# Patient Record
Sex: Female | Born: 1950 | Race: White | Hispanic: No | Marital: Married | Smoking: Former smoker
Health system: Southern US, Community
[De-identification: ages and names within clinical notes are randomized; demographics above are authoritative.]

## PROBLEM LIST (undated history)

## (undated) DIAGNOSIS — J449 Chronic obstructive pulmonary disease, unspecified: Secondary | ICD-10-CM

## (undated) DIAGNOSIS — J42 Unspecified chronic bronchitis: Secondary | ICD-10-CM

## (undated) DIAGNOSIS — M109 Gout, unspecified: Secondary | ICD-10-CM

## (undated) DIAGNOSIS — F329 Major depressive disorder, single episode, unspecified: Secondary | ICD-10-CM

## (undated) DIAGNOSIS — K635 Polyp of colon: Secondary | ICD-10-CM

## (undated) DIAGNOSIS — R0902 Hypoxemia: Secondary | ICD-10-CM

## (undated) DIAGNOSIS — F32A Depression, unspecified: Secondary | ICD-10-CM

## (undated) DIAGNOSIS — K219 Gastro-esophageal reflux disease without esophagitis: Secondary | ICD-10-CM

## (undated) DIAGNOSIS — M797 Fibromyalgia: Secondary | ICD-10-CM

## (undated) DIAGNOSIS — F419 Anxiety disorder, unspecified: Secondary | ICD-10-CM

## (undated) DIAGNOSIS — G629 Polyneuropathy, unspecified: Secondary | ICD-10-CM

## (undated) DIAGNOSIS — J189 Pneumonia, unspecified organism: Secondary | ICD-10-CM

## (undated) DIAGNOSIS — M1990 Unspecified osteoarthritis, unspecified site: Secondary | ICD-10-CM

## (undated) DIAGNOSIS — T7840XA Allergy, unspecified, initial encounter: Secondary | ICD-10-CM

## (undated) DIAGNOSIS — J45909 Unspecified asthma, uncomplicated: Secondary | ICD-10-CM

## (undated) DIAGNOSIS — H547 Unspecified visual loss: Secondary | ICD-10-CM

## (undated) DIAGNOSIS — F319 Bipolar disorder, unspecified: Secondary | ICD-10-CM

## (undated) DIAGNOSIS — Z8719 Personal history of other diseases of the digestive system: Secondary | ICD-10-CM

## (undated) DIAGNOSIS — K8020 Calculus of gallbladder without cholecystitis without obstruction: Secondary | ICD-10-CM

## (undated) DIAGNOSIS — E119 Type 2 diabetes mellitus without complications: Secondary | ICD-10-CM

## (undated) DIAGNOSIS — Z8489 Family history of other specified conditions: Secondary | ICD-10-CM

## (undated) DIAGNOSIS — H9190 Unspecified hearing loss, unspecified ear: Secondary | ICD-10-CM

## (undated) DIAGNOSIS — I4891 Unspecified atrial fibrillation: Secondary | ICD-10-CM

## (undated) DIAGNOSIS — Z86010 Personal history of colonic polyps: Secondary | ICD-10-CM

## (undated) DIAGNOSIS — I428 Other cardiomyopathies: Secondary | ICD-10-CM

## (undated) DIAGNOSIS — I5042 Chronic combined systolic (congestive) and diastolic (congestive) heart failure: Secondary | ICD-10-CM

## (undated) DIAGNOSIS — Z9289 Personal history of other medical treatment: Secondary | ICD-10-CM

## (undated) DIAGNOSIS — E7800 Pure hypercholesterolemia, unspecified: Secondary | ICD-10-CM

## (undated) DIAGNOSIS — G4733 Obstructive sleep apnea (adult) (pediatric): Secondary | ICD-10-CM

## (undated) DIAGNOSIS — Z9981 Dependence on supplemental oxygen: Secondary | ICD-10-CM

## (undated) DIAGNOSIS — D649 Anemia, unspecified: Secondary | ICD-10-CM

## (undated) HISTORY — PX: UMBILICAL HERNIA REPAIR: SHX196

## (undated) HISTORY — DX: Calculus of gallbladder without cholecystitis without obstruction: K80.20

## (undated) HISTORY — DX: Major depressive disorder, single episode, unspecified: F32.9

## (undated) HISTORY — DX: Depression, unspecified: F32.A

## (undated) HISTORY — DX: Personal history of colonic polyps: Z86.010

## (undated) HISTORY — DX: Allergy, unspecified, initial encounter: T78.40XA

## (undated) HISTORY — PX: HERNIA REPAIR: SHX51

## (undated) HISTORY — DX: Pneumonia, unspecified organism: J18.9

## (undated) HISTORY — DX: Anxiety disorder, unspecified: F41.9

## (undated) HISTORY — PX: BLADDER SUSPENSION: SHX72

## (undated) HISTORY — DX: Hypoxemia: R09.02

## (undated) HISTORY — DX: Gout, unspecified: M10.9

## (undated) HISTORY — DX: Chronic combined systolic (congestive) and diastolic (congestive) heart failure: I50.42

## (undated) HISTORY — DX: Polyp of colon: K63.5

## (undated) HISTORY — PX: HEEL SPUR SURGERY: SHX665

## (undated) HISTORY — PX: APPENDECTOMY: SHX54

## (undated) HISTORY — PX: TUBAL LIGATION: SHX77

## (undated) HISTORY — PX: OTHER SURGICAL HISTORY: SHX169

## (undated) HISTORY — DX: Polyneuropathy, unspecified: G62.9

## (undated) HISTORY — DX: Other cardiomyopathies: I42.8

---

## 2007-06-04 HISTORY — PX: CERVICAL DISC SURGERY: SHX588

## 2011-06-07 DIAGNOSIS — N6029 Fibroadenosis of unspecified breast: Secondary | ICD-10-CM | POA: Diagnosis not present

## 2011-06-07 DIAGNOSIS — Z1231 Encounter for screening mammogram for malignant neoplasm of breast: Secondary | ICD-10-CM | POA: Diagnosis not present

## 2011-06-17 DIAGNOSIS — Z7901 Long term (current) use of anticoagulants: Secondary | ICD-10-CM | POA: Diagnosis not present

## 2011-06-17 DIAGNOSIS — I4891 Unspecified atrial fibrillation: Secondary | ICD-10-CM | POA: Diagnosis not present

## 2011-06-17 DIAGNOSIS — Z5181 Encounter for therapeutic drug level monitoring: Secondary | ICD-10-CM | POA: Diagnosis not present

## 2011-06-24 DIAGNOSIS — E1129 Type 2 diabetes mellitus with other diabetic kidney complication: Secondary | ICD-10-CM | POA: Diagnosis not present

## 2011-06-27 DIAGNOSIS — M5412 Radiculopathy, cervical region: Secondary | ICD-10-CM | POA: Diagnosis not present

## 2011-06-27 DIAGNOSIS — M961 Postlaminectomy syndrome, not elsewhere classified: Secondary | ICD-10-CM | POA: Diagnosis not present

## 2011-06-27 DIAGNOSIS — IMO0002 Reserved for concepts with insufficient information to code with codable children: Secondary | ICD-10-CM | POA: Diagnosis not present

## 2011-06-27 DIAGNOSIS — S142XXA Injury of nerve root of cervical spine, initial encounter: Secondary | ICD-10-CM | POA: Diagnosis not present

## 2011-06-27 DIAGNOSIS — M5137 Other intervertebral disc degeneration, lumbosacral region: Secondary | ICD-10-CM | POA: Diagnosis not present

## 2011-06-27 DIAGNOSIS — M1610 Unilateral primary osteoarthritis, unspecified hip: Secondary | ICD-10-CM | POA: Diagnosis not present

## 2011-06-27 DIAGNOSIS — IMO0001 Reserved for inherently not codable concepts without codable children: Secondary | ICD-10-CM | POA: Diagnosis not present

## 2011-06-27 DIAGNOSIS — M47817 Spondylosis without myelopathy or radiculopathy, lumbosacral region: Secondary | ICD-10-CM | POA: Diagnosis not present

## 2011-07-03 DIAGNOSIS — J449 Chronic obstructive pulmonary disease, unspecified: Secondary | ICD-10-CM | POA: Diagnosis not present

## 2011-07-03 DIAGNOSIS — G4710 Hypersomnia, unspecified: Secondary | ICD-10-CM | POA: Diagnosis not present

## 2011-07-03 DIAGNOSIS — G4730 Sleep apnea, unspecified: Secondary | ICD-10-CM | POA: Diagnosis not present

## 2011-07-03 DIAGNOSIS — G4733 Obstructive sleep apnea (adult) (pediatric): Secondary | ICD-10-CM | POA: Diagnosis not present

## 2011-07-04 DIAGNOSIS — I951 Orthostatic hypotension: Secondary | ICD-10-CM | POA: Diagnosis not present

## 2011-07-04 DIAGNOSIS — J449 Chronic obstructive pulmonary disease, unspecified: Secondary | ICD-10-CM | POA: Diagnosis not present

## 2011-07-04 DIAGNOSIS — R0902 Hypoxemia: Secondary | ICD-10-CM | POA: Diagnosis not present

## 2011-07-05 DIAGNOSIS — Z6841 Body Mass Index (BMI) 40.0 and over, adult: Secondary | ICD-10-CM | POA: Diagnosis not present

## 2011-07-05 DIAGNOSIS — E785 Hyperlipidemia, unspecified: Secondary | ICD-10-CM | POA: Diagnosis not present

## 2011-07-05 DIAGNOSIS — E119 Type 2 diabetes mellitus without complications: Secondary | ICD-10-CM | POA: Diagnosis not present

## 2011-07-12 DIAGNOSIS — M961 Postlaminectomy syndrome, not elsewhere classified: Secondary | ICD-10-CM | POA: Diagnosis not present

## 2011-07-12 DIAGNOSIS — M5137 Other intervertebral disc degeneration, lumbosacral region: Secondary | ICD-10-CM | POA: Diagnosis not present

## 2011-07-12 DIAGNOSIS — M5412 Radiculopathy, cervical region: Secondary | ICD-10-CM | POA: Diagnosis not present

## 2011-07-15 DIAGNOSIS — M5412 Radiculopathy, cervical region: Secondary | ICD-10-CM | POA: Diagnosis not present

## 2011-07-15 DIAGNOSIS — I4891 Unspecified atrial fibrillation: Secondary | ICD-10-CM | POA: Diagnosis not present

## 2011-07-15 DIAGNOSIS — Z7901 Long term (current) use of anticoagulants: Secondary | ICD-10-CM | POA: Diagnosis not present

## 2011-07-15 DIAGNOSIS — M5137 Other intervertebral disc degeneration, lumbosacral region: Secondary | ICD-10-CM | POA: Diagnosis not present

## 2011-07-15 DIAGNOSIS — M961 Postlaminectomy syndrome, not elsewhere classified: Secondary | ICD-10-CM | POA: Diagnosis not present

## 2011-07-15 DIAGNOSIS — Z5181 Encounter for therapeutic drug level monitoring: Secondary | ICD-10-CM | POA: Diagnosis not present

## 2011-07-17 DIAGNOSIS — M5412 Radiculopathy, cervical region: Secondary | ICD-10-CM | POA: Diagnosis not present

## 2011-07-17 DIAGNOSIS — M961 Postlaminectomy syndrome, not elsewhere classified: Secondary | ICD-10-CM | POA: Diagnosis not present

## 2011-07-17 DIAGNOSIS — M5137 Other intervertebral disc degeneration, lumbosacral region: Secondary | ICD-10-CM | POA: Diagnosis not present

## 2011-07-19 DIAGNOSIS — M5137 Other intervertebral disc degeneration, lumbosacral region: Secondary | ICD-10-CM | POA: Diagnosis not present

## 2011-07-19 DIAGNOSIS — M5412 Radiculopathy, cervical region: Secondary | ICD-10-CM | POA: Diagnosis not present

## 2011-07-19 DIAGNOSIS — M961 Postlaminectomy syndrome, not elsewhere classified: Secondary | ICD-10-CM | POA: Diagnosis not present

## 2011-07-22 DIAGNOSIS — E1149 Type 2 diabetes mellitus with other diabetic neurological complication: Secondary | ICD-10-CM | POA: Diagnosis not present

## 2011-07-22 DIAGNOSIS — M79609 Pain in unspecified limb: Secondary | ICD-10-CM | POA: Diagnosis not present

## 2011-07-22 DIAGNOSIS — B351 Tinea unguium: Secondary | ICD-10-CM | POA: Diagnosis not present

## 2011-07-22 DIAGNOSIS — L84 Corns and callosities: Secondary | ICD-10-CM | POA: Diagnosis not present

## 2011-07-24 DIAGNOSIS — E785 Hyperlipidemia, unspecified: Secondary | ICD-10-CM | POA: Diagnosis not present

## 2011-07-24 DIAGNOSIS — M961 Postlaminectomy syndrome, not elsewhere classified: Secondary | ICD-10-CM | POA: Diagnosis not present

## 2011-07-24 DIAGNOSIS — M5137 Other intervertebral disc degeneration, lumbosacral region: Secondary | ICD-10-CM | POA: Diagnosis not present

## 2011-07-24 DIAGNOSIS — M5412 Radiculopathy, cervical region: Secondary | ICD-10-CM | POA: Diagnosis not present

## 2011-07-24 DIAGNOSIS — J069 Acute upper respiratory infection, unspecified: Secondary | ICD-10-CM | POA: Diagnosis not present

## 2011-07-25 DIAGNOSIS — M5137 Other intervertebral disc degeneration, lumbosacral region: Secondary | ICD-10-CM | POA: Diagnosis not present

## 2011-07-25 DIAGNOSIS — IMO0002 Reserved for concepts with insufficient information to code with codable children: Secondary | ICD-10-CM | POA: Diagnosis not present

## 2011-07-25 DIAGNOSIS — M1610 Unilateral primary osteoarthritis, unspecified hip: Secondary | ICD-10-CM | POA: Diagnosis not present

## 2011-07-25 DIAGNOSIS — M5412 Radiculopathy, cervical region: Secondary | ICD-10-CM | POA: Diagnosis not present

## 2011-07-25 DIAGNOSIS — M961 Postlaminectomy syndrome, not elsewhere classified: Secondary | ICD-10-CM | POA: Diagnosis not present

## 2011-07-25 DIAGNOSIS — S142XXA Injury of nerve root of cervical spine, initial encounter: Secondary | ICD-10-CM | POA: Diagnosis not present

## 2011-07-25 DIAGNOSIS — M47817 Spondylosis without myelopathy or radiculopathy, lumbosacral region: Secondary | ICD-10-CM | POA: Diagnosis not present

## 2011-07-25 DIAGNOSIS — IMO0001 Reserved for inherently not codable concepts without codable children: Secondary | ICD-10-CM | POA: Diagnosis not present

## 2011-07-26 DIAGNOSIS — M5412 Radiculopathy, cervical region: Secondary | ICD-10-CM | POA: Diagnosis not present

## 2011-07-26 DIAGNOSIS — M961 Postlaminectomy syndrome, not elsewhere classified: Secondary | ICD-10-CM | POA: Diagnosis not present

## 2011-07-26 DIAGNOSIS — M5137 Other intervertebral disc degeneration, lumbosacral region: Secondary | ICD-10-CM | POA: Diagnosis not present

## 2011-07-29 DIAGNOSIS — M961 Postlaminectomy syndrome, not elsewhere classified: Secondary | ICD-10-CM | POA: Diagnosis not present

## 2011-07-29 DIAGNOSIS — M5137 Other intervertebral disc degeneration, lumbosacral region: Secondary | ICD-10-CM | POA: Diagnosis not present

## 2011-07-29 DIAGNOSIS — M5412 Radiculopathy, cervical region: Secondary | ICD-10-CM | POA: Diagnosis not present

## 2011-07-30 DIAGNOSIS — J069 Acute upper respiratory infection, unspecified: Secondary | ICD-10-CM | POA: Diagnosis not present

## 2011-07-30 DIAGNOSIS — J209 Acute bronchitis, unspecified: Secondary | ICD-10-CM | POA: Diagnosis not present

## 2011-07-31 DIAGNOSIS — M5137 Other intervertebral disc degeneration, lumbosacral region: Secondary | ICD-10-CM | POA: Diagnosis not present

## 2011-07-31 DIAGNOSIS — M961 Postlaminectomy syndrome, not elsewhere classified: Secondary | ICD-10-CM | POA: Diagnosis not present

## 2011-07-31 DIAGNOSIS — M5412 Radiculopathy, cervical region: Secondary | ICD-10-CM | POA: Diagnosis not present

## 2011-08-02 DIAGNOSIS — M5412 Radiculopathy, cervical region: Secondary | ICD-10-CM | POA: Diagnosis not present

## 2011-08-02 DIAGNOSIS — M5137 Other intervertebral disc degeneration, lumbosacral region: Secondary | ICD-10-CM | POA: Diagnosis not present

## 2011-08-02 DIAGNOSIS — M961 Postlaminectomy syndrome, not elsewhere classified: Secondary | ICD-10-CM | POA: Diagnosis not present

## 2011-08-05 DIAGNOSIS — M5137 Other intervertebral disc degeneration, lumbosacral region: Secondary | ICD-10-CM | POA: Diagnosis not present

## 2011-08-05 DIAGNOSIS — M961 Postlaminectomy syndrome, not elsewhere classified: Secondary | ICD-10-CM | POA: Diagnosis not present

## 2011-08-05 DIAGNOSIS — M5412 Radiculopathy, cervical region: Secondary | ICD-10-CM | POA: Diagnosis not present

## 2011-08-07 DIAGNOSIS — M5412 Radiculopathy, cervical region: Secondary | ICD-10-CM | POA: Diagnosis not present

## 2011-08-07 DIAGNOSIS — M961 Postlaminectomy syndrome, not elsewhere classified: Secondary | ICD-10-CM | POA: Diagnosis not present

## 2011-08-07 DIAGNOSIS — M5137 Other intervertebral disc degeneration, lumbosacral region: Secondary | ICD-10-CM | POA: Diagnosis not present

## 2011-08-09 DIAGNOSIS — M5412 Radiculopathy, cervical region: Secondary | ICD-10-CM | POA: Diagnosis not present

## 2011-08-09 DIAGNOSIS — M5137 Other intervertebral disc degeneration, lumbosacral region: Secondary | ICD-10-CM | POA: Diagnosis not present

## 2011-08-09 DIAGNOSIS — M961 Postlaminectomy syndrome, not elsewhere classified: Secondary | ICD-10-CM | POA: Diagnosis not present

## 2011-08-12 DIAGNOSIS — M961 Postlaminectomy syndrome, not elsewhere classified: Secondary | ICD-10-CM | POA: Diagnosis not present

## 2011-08-12 DIAGNOSIS — M5137 Other intervertebral disc degeneration, lumbosacral region: Secondary | ICD-10-CM | POA: Diagnosis not present

## 2011-08-12 DIAGNOSIS — M5412 Radiculopathy, cervical region: Secondary | ICD-10-CM | POA: Diagnosis not present

## 2011-08-14 DIAGNOSIS — J449 Chronic obstructive pulmonary disease, unspecified: Secondary | ICD-10-CM | POA: Diagnosis not present

## 2011-08-14 DIAGNOSIS — M549 Dorsalgia, unspecified: Secondary | ICD-10-CM | POA: Diagnosis not present

## 2011-08-14 DIAGNOSIS — E119 Type 2 diabetes mellitus without complications: Secondary | ICD-10-CM | POA: Diagnosis not present

## 2011-08-14 DIAGNOSIS — Z818 Family history of other mental and behavioral disorders: Secondary | ICD-10-CM | POA: Diagnosis not present

## 2011-08-14 DIAGNOSIS — M5412 Radiculopathy, cervical region: Secondary | ICD-10-CM | POA: Diagnosis not present

## 2011-08-14 DIAGNOSIS — M5137 Other intervertebral disc degeneration, lumbosacral region: Secondary | ICD-10-CM | POA: Diagnosis not present

## 2011-08-14 DIAGNOSIS — F39 Unspecified mood [affective] disorder: Secondary | ICD-10-CM | POA: Diagnosis not present

## 2011-08-14 DIAGNOSIS — M961 Postlaminectomy syndrome, not elsewhere classified: Secondary | ICD-10-CM | POA: Diagnosis not present

## 2011-08-16 DIAGNOSIS — M5412 Radiculopathy, cervical region: Secondary | ICD-10-CM | POA: Diagnosis not present

## 2011-08-16 DIAGNOSIS — M5137 Other intervertebral disc degeneration, lumbosacral region: Secondary | ICD-10-CM | POA: Diagnosis not present

## 2011-08-16 DIAGNOSIS — M961 Postlaminectomy syndrome, not elsewhere classified: Secondary | ICD-10-CM | POA: Diagnosis not present

## 2011-08-19 DIAGNOSIS — I4891 Unspecified atrial fibrillation: Secondary | ICD-10-CM | POA: Diagnosis not present

## 2011-08-19 DIAGNOSIS — M5412 Radiculopathy, cervical region: Secondary | ICD-10-CM | POA: Diagnosis not present

## 2011-08-19 DIAGNOSIS — M961 Postlaminectomy syndrome, not elsewhere classified: Secondary | ICD-10-CM | POA: Diagnosis not present

## 2011-08-19 DIAGNOSIS — M5137 Other intervertebral disc degeneration, lumbosacral region: Secondary | ICD-10-CM | POA: Diagnosis not present

## 2011-08-19 DIAGNOSIS — Z7901 Long term (current) use of anticoagulants: Secondary | ICD-10-CM | POA: Diagnosis not present

## 2011-08-19 DIAGNOSIS — Z5181 Encounter for therapeutic drug level monitoring: Secondary | ICD-10-CM | POA: Diagnosis not present

## 2011-08-21 DIAGNOSIS — M47817 Spondylosis without myelopathy or radiculopathy, lumbosacral region: Secondary | ICD-10-CM | POA: Diagnosis not present

## 2011-08-21 DIAGNOSIS — M5137 Other intervertebral disc degeneration, lumbosacral region: Secondary | ICD-10-CM | POA: Diagnosis not present

## 2011-08-21 DIAGNOSIS — M5412 Radiculopathy, cervical region: Secondary | ICD-10-CM | POA: Diagnosis not present

## 2011-08-21 DIAGNOSIS — M1610 Unilateral primary osteoarthritis, unspecified hip: Secondary | ICD-10-CM | POA: Diagnosis not present

## 2011-08-21 DIAGNOSIS — M961 Postlaminectomy syndrome, not elsewhere classified: Secondary | ICD-10-CM | POA: Diagnosis not present

## 2011-08-21 DIAGNOSIS — M1710 Unilateral primary osteoarthritis, unspecified knee: Secondary | ICD-10-CM | POA: Diagnosis not present

## 2011-08-21 DIAGNOSIS — M25569 Pain in unspecified knee: Secondary | ICD-10-CM | POA: Diagnosis not present

## 2011-08-21 DIAGNOSIS — IMO0002 Reserved for concepts with insufficient information to code with codable children: Secondary | ICD-10-CM | POA: Diagnosis not present

## 2011-08-22 DIAGNOSIS — IMO0002 Reserved for concepts with insufficient information to code with codable children: Secondary | ICD-10-CM | POA: Diagnosis not present

## 2011-08-22 DIAGNOSIS — M1710 Unilateral primary osteoarthritis, unspecified knee: Secondary | ICD-10-CM | POA: Diagnosis not present

## 2011-08-22 DIAGNOSIS — R109 Unspecified abdominal pain: Secondary | ICD-10-CM | POA: Diagnosis not present

## 2011-08-22 DIAGNOSIS — E785 Hyperlipidemia, unspecified: Secondary | ICD-10-CM | POA: Diagnosis not present

## 2011-08-22 DIAGNOSIS — M1990 Unspecified osteoarthritis, unspecified site: Secondary | ICD-10-CM | POA: Diagnosis not present

## 2011-08-23 DIAGNOSIS — M961 Postlaminectomy syndrome, not elsewhere classified: Secondary | ICD-10-CM | POA: Diagnosis not present

## 2011-08-23 DIAGNOSIS — M5412 Radiculopathy, cervical region: Secondary | ICD-10-CM | POA: Diagnosis not present

## 2011-08-23 DIAGNOSIS — M5137 Other intervertebral disc degeneration, lumbosacral region: Secondary | ICD-10-CM | POA: Diagnosis not present

## 2011-08-24 DIAGNOSIS — M5137 Other intervertebral disc degeneration, lumbosacral region: Secondary | ICD-10-CM | POA: Diagnosis not present

## 2011-08-24 DIAGNOSIS — M961 Postlaminectomy syndrome, not elsewhere classified: Secondary | ICD-10-CM | POA: Diagnosis not present

## 2011-08-24 DIAGNOSIS — M5412 Radiculopathy, cervical region: Secondary | ICD-10-CM | POA: Diagnosis not present

## 2011-08-26 DIAGNOSIS — M5412 Radiculopathy, cervical region: Secondary | ICD-10-CM | POA: Diagnosis not present

## 2011-08-26 DIAGNOSIS — M5137 Other intervertebral disc degeneration, lumbosacral region: Secondary | ICD-10-CM | POA: Diagnosis not present

## 2011-08-26 DIAGNOSIS — M961 Postlaminectomy syndrome, not elsewhere classified: Secondary | ICD-10-CM | POA: Diagnosis not present

## 2011-08-28 DIAGNOSIS — M1710 Unilateral primary osteoarthritis, unspecified knee: Secondary | ICD-10-CM | POA: Diagnosis not present

## 2011-08-28 DIAGNOSIS — M1990 Unspecified osteoarthritis, unspecified site: Secondary | ICD-10-CM | POA: Diagnosis not present

## 2011-08-28 DIAGNOSIS — IMO0002 Reserved for concepts with insufficient information to code with codable children: Secondary | ICD-10-CM | POA: Diagnosis not present

## 2011-08-28 DIAGNOSIS — M25569 Pain in unspecified knee: Secondary | ICD-10-CM | POA: Diagnosis not present

## 2011-08-31 DIAGNOSIS — R161 Splenomegaly, not elsewhere classified: Secondary | ICD-10-CM | POA: Diagnosis not present

## 2011-08-31 DIAGNOSIS — R109 Unspecified abdominal pain: Secondary | ICD-10-CM | POA: Diagnosis not present

## 2011-09-02 DIAGNOSIS — I4891 Unspecified atrial fibrillation: Secondary | ICD-10-CM | POA: Diagnosis not present

## 2011-09-02 DIAGNOSIS — Z79899 Other long term (current) drug therapy: Secondary | ICD-10-CM | POA: Diagnosis not present

## 2011-09-02 DIAGNOSIS — F39 Unspecified mood [affective] disorder: Secondary | ICD-10-CM | POA: Diagnosis not present

## 2011-09-02 DIAGNOSIS — M5137 Other intervertebral disc degeneration, lumbosacral region: Secondary | ICD-10-CM | POA: Diagnosis not present

## 2011-09-02 DIAGNOSIS — M5412 Radiculopathy, cervical region: Secondary | ICD-10-CM | POA: Diagnosis not present

## 2011-09-02 DIAGNOSIS — I252 Old myocardial infarction: Secondary | ICD-10-CM | POA: Diagnosis not present

## 2011-09-02 DIAGNOSIS — M961 Postlaminectomy syndrome, not elsewhere classified: Secondary | ICD-10-CM | POA: Diagnosis not present

## 2011-09-02 DIAGNOSIS — R9431 Abnormal electrocardiogram [ECG] [EKG]: Secondary | ICD-10-CM | POA: Diagnosis not present

## 2011-09-04 DIAGNOSIS — M961 Postlaminectomy syndrome, not elsewhere classified: Secondary | ICD-10-CM | POA: Diagnosis not present

## 2011-09-04 DIAGNOSIS — IMO0002 Reserved for concepts with insufficient information to code with codable children: Secondary | ICD-10-CM | POA: Diagnosis not present

## 2011-09-04 DIAGNOSIS — M5412 Radiculopathy, cervical region: Secondary | ICD-10-CM | POA: Diagnosis not present

## 2011-09-04 DIAGNOSIS — M1990 Unspecified osteoarthritis, unspecified site: Secondary | ICD-10-CM | POA: Diagnosis not present

## 2011-09-04 DIAGNOSIS — M25569 Pain in unspecified knee: Secondary | ICD-10-CM | POA: Diagnosis not present

## 2011-09-04 DIAGNOSIS — M1710 Unilateral primary osteoarthritis, unspecified knee: Secondary | ICD-10-CM | POA: Diagnosis not present

## 2011-09-04 DIAGNOSIS — M5137 Other intervertebral disc degeneration, lumbosacral region: Secondary | ICD-10-CM | POA: Diagnosis not present

## 2011-09-06 DIAGNOSIS — M961 Postlaminectomy syndrome, not elsewhere classified: Secondary | ICD-10-CM | POA: Diagnosis not present

## 2011-09-06 DIAGNOSIS — M5412 Radiculopathy, cervical region: Secondary | ICD-10-CM | POA: Diagnosis not present

## 2011-09-06 DIAGNOSIS — M5137 Other intervertebral disc degeneration, lumbosacral region: Secondary | ICD-10-CM | POA: Diagnosis not present

## 2011-09-09 DIAGNOSIS — Z7901 Long term (current) use of anticoagulants: Secondary | ICD-10-CM | POA: Diagnosis not present

## 2011-09-09 DIAGNOSIS — Z5181 Encounter for therapeutic drug level monitoring: Secondary | ICD-10-CM | POA: Diagnosis not present

## 2011-09-09 DIAGNOSIS — M5137 Other intervertebral disc degeneration, lumbosacral region: Secondary | ICD-10-CM | POA: Diagnosis not present

## 2011-09-09 DIAGNOSIS — I4891 Unspecified atrial fibrillation: Secondary | ICD-10-CM | POA: Diagnosis not present

## 2011-09-09 DIAGNOSIS — M5412 Radiculopathy, cervical region: Secondary | ICD-10-CM | POA: Diagnosis not present

## 2011-09-09 DIAGNOSIS — M961 Postlaminectomy syndrome, not elsewhere classified: Secondary | ICD-10-CM | POA: Diagnosis not present

## 2011-09-11 DIAGNOSIS — M25569 Pain in unspecified knee: Secondary | ICD-10-CM | POA: Diagnosis not present

## 2011-09-11 DIAGNOSIS — M1990 Unspecified osteoarthritis, unspecified site: Secondary | ICD-10-CM | POA: Diagnosis not present

## 2011-09-18 DIAGNOSIS — IMO0002 Reserved for concepts with insufficient information to code with codable children: Secondary | ICD-10-CM | POA: Diagnosis not present

## 2011-09-18 DIAGNOSIS — M25569 Pain in unspecified knee: Secondary | ICD-10-CM | POA: Diagnosis not present

## 2011-09-18 DIAGNOSIS — M5412 Radiculopathy, cervical region: Secondary | ICD-10-CM | POA: Diagnosis not present

## 2011-09-18 DIAGNOSIS — M961 Postlaminectomy syndrome, not elsewhere classified: Secondary | ICD-10-CM | POA: Diagnosis not present

## 2011-09-18 DIAGNOSIS — M47817 Spondylosis without myelopathy or radiculopathy, lumbosacral region: Secondary | ICD-10-CM | POA: Diagnosis not present

## 2011-09-18 DIAGNOSIS — M5137 Other intervertebral disc degeneration, lumbosacral region: Secondary | ICD-10-CM | POA: Diagnosis not present

## 2011-09-18 DIAGNOSIS — M1710 Unilateral primary osteoarthritis, unspecified knee: Secondary | ICD-10-CM | POA: Diagnosis not present

## 2011-09-18 DIAGNOSIS — M1610 Unilateral primary osteoarthritis, unspecified hip: Secondary | ICD-10-CM | POA: Diagnosis not present

## 2011-09-24 DIAGNOSIS — Z01810 Encounter for preprocedural cardiovascular examination: Secondary | ICD-10-CM | POA: Diagnosis not present

## 2011-09-24 DIAGNOSIS — I10 Essential (primary) hypertension: Secondary | ICD-10-CM | POA: Diagnosis not present

## 2011-09-24 DIAGNOSIS — R109 Unspecified abdominal pain: Secondary | ICD-10-CM | POA: Diagnosis not present

## 2011-09-24 DIAGNOSIS — Z01818 Encounter for other preprocedural examination: Secondary | ICD-10-CM | POA: Diagnosis not present

## 2011-09-24 DIAGNOSIS — N3080 Other cystitis without hematuria: Secondary | ICD-10-CM | POA: Diagnosis not present

## 2011-09-30 DIAGNOSIS — Z5181 Encounter for therapeutic drug level monitoring: Secondary | ICD-10-CM | POA: Diagnosis not present

## 2011-09-30 DIAGNOSIS — I4891 Unspecified atrial fibrillation: Secondary | ICD-10-CM | POA: Diagnosis not present

## 2011-09-30 DIAGNOSIS — Z7901 Long term (current) use of anticoagulants: Secondary | ICD-10-CM | POA: Diagnosis not present

## 2011-10-03 DIAGNOSIS — J309 Allergic rhinitis, unspecified: Secondary | ICD-10-CM | POA: Diagnosis not present

## 2011-10-03 DIAGNOSIS — E785 Hyperlipidemia, unspecified: Secondary | ICD-10-CM | POA: Diagnosis not present

## 2011-10-03 DIAGNOSIS — I10 Essential (primary) hypertension: Secondary | ICD-10-CM | POA: Diagnosis not present

## 2011-10-10 DIAGNOSIS — G4730 Sleep apnea, unspecified: Secondary | ICD-10-CM | POA: Diagnosis not present

## 2011-10-10 DIAGNOSIS — E669 Obesity, unspecified: Secondary | ICD-10-CM | POA: Diagnosis not present

## 2011-10-10 DIAGNOSIS — G4733 Obstructive sleep apnea (adult) (pediatric): Secondary | ICD-10-CM | POA: Diagnosis not present

## 2011-10-10 DIAGNOSIS — G4710 Hypersomnia, unspecified: Secondary | ICD-10-CM | POA: Diagnosis not present

## 2011-10-10 DIAGNOSIS — J9610 Chronic respiratory failure, unspecified whether with hypoxia or hypercapnia: Secondary | ICD-10-CM | POA: Diagnosis not present

## 2011-10-11 DIAGNOSIS — I4891 Unspecified atrial fibrillation: Secondary | ICD-10-CM | POA: Diagnosis not present

## 2011-10-11 DIAGNOSIS — E1165 Type 2 diabetes mellitus with hyperglycemia: Secondary | ICD-10-CM | POA: Diagnosis not present

## 2011-10-11 DIAGNOSIS — Z713 Dietary counseling and surveillance: Secondary | ICD-10-CM | POA: Diagnosis not present

## 2011-10-11 DIAGNOSIS — E1129 Type 2 diabetes mellitus with other diabetic kidney complication: Secondary | ICD-10-CM | POA: Diagnosis not present

## 2011-10-11 DIAGNOSIS — I10 Essential (primary) hypertension: Secondary | ICD-10-CM | POA: Diagnosis not present

## 2011-10-11 DIAGNOSIS — Z01810 Encounter for preprocedural cardiovascular examination: Secondary | ICD-10-CM | POA: Diagnosis not present

## 2011-10-11 DIAGNOSIS — R9431 Abnormal electrocardiogram [ECG] [EKG]: Secondary | ICD-10-CM | POA: Diagnosis not present

## 2011-10-11 DIAGNOSIS — Z794 Long term (current) use of insulin: Secondary | ICD-10-CM | POA: Diagnosis not present

## 2011-10-14 DIAGNOSIS — Z01818 Encounter for other preprocedural examination: Secondary | ICD-10-CM | POA: Diagnosis not present

## 2011-10-14 DIAGNOSIS — R350 Frequency of micturition: Secondary | ICD-10-CM | POA: Diagnosis not present

## 2011-10-14 DIAGNOSIS — Z5309 Procedure and treatment not carried out because of other contraindication: Secondary | ICD-10-CM | POA: Diagnosis not present

## 2011-10-15 DIAGNOSIS — J449 Chronic obstructive pulmonary disease, unspecified: Secondary | ICD-10-CM | POA: Diagnosis not present

## 2011-10-15 DIAGNOSIS — R32 Unspecified urinary incontinence: Secondary | ICD-10-CM | POA: Diagnosis not present

## 2011-10-15 DIAGNOSIS — Z9981 Dependence on supplemental oxygen: Secondary | ICD-10-CM | POA: Diagnosis not present

## 2011-10-15 DIAGNOSIS — Z466 Encounter for fitting and adjustment of urinary device: Secondary | ICD-10-CM | POA: Diagnosis not present

## 2011-10-15 DIAGNOSIS — I10 Essential (primary) hypertension: Secondary | ICD-10-CM | POA: Diagnosis not present

## 2011-10-15 DIAGNOSIS — R350 Frequency of micturition: Secondary | ICD-10-CM | POA: Diagnosis not present

## 2011-10-15 DIAGNOSIS — Z7901 Long term (current) use of anticoagulants: Secondary | ICD-10-CM | POA: Diagnosis not present

## 2011-10-15 DIAGNOSIS — G4730 Sleep apnea, unspecified: Secondary | ICD-10-CM | POA: Diagnosis not present

## 2011-10-15 DIAGNOSIS — I509 Heart failure, unspecified: Secondary | ICD-10-CM | POA: Diagnosis not present

## 2011-10-15 DIAGNOSIS — Z87891 Personal history of nicotine dependence: Secondary | ICD-10-CM | POA: Diagnosis not present

## 2011-10-15 DIAGNOSIS — T8389XA Other specified complication of genitourinary prosthetic devices, implants and grafts, initial encounter: Secondary | ICD-10-CM | POA: Diagnosis not present

## 2011-10-15 DIAGNOSIS — N3941 Urge incontinence: Secondary | ICD-10-CM | POA: Diagnosis not present

## 2011-10-15 DIAGNOSIS — E119 Type 2 diabetes mellitus without complications: Secondary | ICD-10-CM | POA: Diagnosis not present

## 2011-10-15 DIAGNOSIS — R3915 Urgency of urination: Secondary | ICD-10-CM | POA: Diagnosis not present

## 2011-10-16 DIAGNOSIS — M1610 Unilateral primary osteoarthritis, unspecified hip: Secondary | ICD-10-CM | POA: Diagnosis not present

## 2011-10-16 DIAGNOSIS — IMO0002 Reserved for concepts with insufficient information to code with codable children: Secondary | ICD-10-CM | POA: Diagnosis not present

## 2011-10-16 DIAGNOSIS — M1710 Unilateral primary osteoarthritis, unspecified knee: Secondary | ICD-10-CM | POA: Diagnosis not present

## 2011-10-16 DIAGNOSIS — M47817 Spondylosis without myelopathy or radiculopathy, lumbosacral region: Secondary | ICD-10-CM | POA: Diagnosis not present

## 2011-10-16 DIAGNOSIS — M961 Postlaminectomy syndrome, not elsewhere classified: Secondary | ICD-10-CM | POA: Diagnosis not present

## 2011-10-16 DIAGNOSIS — M5137 Other intervertebral disc degeneration, lumbosacral region: Secondary | ICD-10-CM | POA: Diagnosis not present

## 2011-10-16 DIAGNOSIS — M25569 Pain in unspecified knee: Secondary | ICD-10-CM | POA: Diagnosis not present

## 2011-10-16 DIAGNOSIS — M5412 Radiculopathy, cervical region: Secondary | ICD-10-CM | POA: Diagnosis not present

## 2011-10-23 DIAGNOSIS — E785 Hyperlipidemia, unspecified: Secondary | ICD-10-CM | POA: Diagnosis not present

## 2011-10-23 DIAGNOSIS — E1142 Type 2 diabetes mellitus with diabetic polyneuropathy: Secondary | ICD-10-CM | POA: Diagnosis not present

## 2011-10-23 DIAGNOSIS — J984 Other disorders of lung: Secondary | ICD-10-CM | POA: Diagnosis not present

## 2011-10-23 DIAGNOSIS — I4891 Unspecified atrial fibrillation: Secondary | ICD-10-CM | POA: Diagnosis not present

## 2011-10-23 DIAGNOSIS — N058 Unspecified nephritic syndrome with other morphologic changes: Secondary | ICD-10-CM | POA: Diagnosis not present

## 2011-10-23 DIAGNOSIS — Z9981 Dependence on supplemental oxygen: Secondary | ICD-10-CM | POA: Diagnosis not present

## 2011-10-23 DIAGNOSIS — Z7901 Long term (current) use of anticoagulants: Secondary | ICD-10-CM | POA: Diagnosis not present

## 2011-10-23 DIAGNOSIS — Z7982 Long term (current) use of aspirin: Secondary | ICD-10-CM | POA: Diagnosis not present

## 2011-10-23 DIAGNOSIS — E1129 Type 2 diabetes mellitus with other diabetic kidney complication: Secondary | ICD-10-CM | POA: Diagnosis not present

## 2011-10-23 DIAGNOSIS — E1149 Type 2 diabetes mellitus with other diabetic neurological complication: Secondary | ICD-10-CM | POA: Diagnosis not present

## 2011-10-23 DIAGNOSIS — Z794 Long term (current) use of insulin: Secondary | ICD-10-CM | POA: Diagnosis not present

## 2011-10-23 DIAGNOSIS — E1165 Type 2 diabetes mellitus with hyperglycemia: Secondary | ICD-10-CM | POA: Diagnosis not present

## 2011-10-23 DIAGNOSIS — Z6841 Body Mass Index (BMI) 40.0 and over, adult: Secondary | ICD-10-CM | POA: Diagnosis not present

## 2011-10-23 DIAGNOSIS — Z5181 Encounter for therapeutic drug level monitoring: Secondary | ICD-10-CM | POA: Diagnosis not present

## 2011-10-29 DIAGNOSIS — R350 Frequency of micturition: Secondary | ICD-10-CM | POA: Diagnosis not present

## 2011-11-05 DIAGNOSIS — Z7901 Long term (current) use of anticoagulants: Secondary | ICD-10-CM | POA: Diagnosis not present

## 2011-11-05 DIAGNOSIS — Z5181 Encounter for therapeutic drug level monitoring: Secondary | ICD-10-CM | POA: Diagnosis not present

## 2011-11-05 DIAGNOSIS — I4891 Unspecified atrial fibrillation: Secondary | ICD-10-CM | POA: Diagnosis not present

## 2011-11-08 DIAGNOSIS — G4700 Insomnia, unspecified: Secondary | ICD-10-CM | POA: Diagnosis not present

## 2011-11-11 DIAGNOSIS — H903 Sensorineural hearing loss, bilateral: Secondary | ICD-10-CM | POA: Diagnosis not present

## 2011-11-11 DIAGNOSIS — H6120 Impacted cerumen, unspecified ear: Secondary | ICD-10-CM | POA: Diagnosis not present

## 2011-11-13 DIAGNOSIS — M1610 Unilateral primary osteoarthritis, unspecified hip: Secondary | ICD-10-CM | POA: Diagnosis not present

## 2011-11-13 DIAGNOSIS — M1710 Unilateral primary osteoarthritis, unspecified knee: Secondary | ICD-10-CM | POA: Diagnosis not present

## 2011-11-13 DIAGNOSIS — M25569 Pain in unspecified knee: Secondary | ICD-10-CM | POA: Diagnosis not present

## 2011-11-13 DIAGNOSIS — IMO0002 Reserved for concepts with insufficient information to code with codable children: Secondary | ICD-10-CM | POA: Diagnosis not present

## 2011-11-13 DIAGNOSIS — M961 Postlaminectomy syndrome, not elsewhere classified: Secondary | ICD-10-CM | POA: Diagnosis not present

## 2011-11-13 DIAGNOSIS — M5137 Other intervertebral disc degeneration, lumbosacral region: Secondary | ICD-10-CM | POA: Diagnosis not present

## 2011-11-13 DIAGNOSIS — M47817 Spondylosis without myelopathy or radiculopathy, lumbosacral region: Secondary | ICD-10-CM | POA: Diagnosis not present

## 2011-11-13 DIAGNOSIS — M5412 Radiculopathy, cervical region: Secondary | ICD-10-CM | POA: Diagnosis not present

## 2011-11-20 DIAGNOSIS — Z5181 Encounter for therapeutic drug level monitoring: Secondary | ICD-10-CM | POA: Diagnosis not present

## 2011-11-20 DIAGNOSIS — I4891 Unspecified atrial fibrillation: Secondary | ICD-10-CM | POA: Diagnosis not present

## 2011-11-20 DIAGNOSIS — Z7901 Long term (current) use of anticoagulants: Secondary | ICD-10-CM | POA: Diagnosis not present

## 2011-11-26 DIAGNOSIS — E1149 Type 2 diabetes mellitus with other diabetic neurological complication: Secondary | ICD-10-CM | POA: Diagnosis not present

## 2011-11-26 DIAGNOSIS — B351 Tinea unguium: Secondary | ICD-10-CM | POA: Diagnosis not present

## 2011-11-26 DIAGNOSIS — M79609 Pain in unspecified limb: Secondary | ICD-10-CM | POA: Diagnosis not present

## 2011-12-02 DIAGNOSIS — F39 Unspecified mood [affective] disorder: Secondary | ICD-10-CM | POA: Diagnosis not present

## 2011-12-02 DIAGNOSIS — M549 Dorsalgia, unspecified: Secondary | ICD-10-CM | POA: Diagnosis not present

## 2011-12-02 DIAGNOSIS — K219 Gastro-esophageal reflux disease without esophagitis: Secondary | ICD-10-CM | POA: Diagnosis not present

## 2011-12-02 DIAGNOSIS — J449 Chronic obstructive pulmonary disease, unspecified: Secondary | ICD-10-CM | POA: Diagnosis not present

## 2011-12-02 DIAGNOSIS — F4310 Post-traumatic stress disorder, unspecified: Secondary | ICD-10-CM | POA: Diagnosis not present

## 2011-12-02 DIAGNOSIS — E119 Type 2 diabetes mellitus without complications: Secondary | ICD-10-CM | POA: Diagnosis not present

## 2011-12-06 DIAGNOSIS — R109 Unspecified abdominal pain: Secondary | ICD-10-CM | POA: Diagnosis not present

## 2011-12-11 DIAGNOSIS — M961 Postlaminectomy syndrome, not elsewhere classified: Secondary | ICD-10-CM | POA: Diagnosis not present

## 2011-12-11 DIAGNOSIS — M5137 Other intervertebral disc degeneration, lumbosacral region: Secondary | ICD-10-CM | POA: Diagnosis not present

## 2011-12-11 DIAGNOSIS — M5412 Radiculopathy, cervical region: Secondary | ICD-10-CM | POA: Diagnosis not present

## 2011-12-11 DIAGNOSIS — M1610 Unilateral primary osteoarthritis, unspecified hip: Secondary | ICD-10-CM | POA: Diagnosis not present

## 2011-12-11 DIAGNOSIS — IMO0002 Reserved for concepts with insufficient information to code with codable children: Secondary | ICD-10-CM | POA: Diagnosis not present

## 2011-12-11 DIAGNOSIS — M47817 Spondylosis without myelopathy or radiculopathy, lumbosacral region: Secondary | ICD-10-CM | POA: Diagnosis not present

## 2011-12-11 DIAGNOSIS — M25569 Pain in unspecified knee: Secondary | ICD-10-CM | POA: Diagnosis not present

## 2011-12-11 DIAGNOSIS — M1710 Unilateral primary osteoarthritis, unspecified knee: Secondary | ICD-10-CM | POA: Diagnosis not present

## 2011-12-18 DIAGNOSIS — Z7901 Long term (current) use of anticoagulants: Secondary | ICD-10-CM | POA: Diagnosis not present

## 2011-12-18 DIAGNOSIS — I4891 Unspecified atrial fibrillation: Secondary | ICD-10-CM | POA: Diagnosis not present

## 2011-12-18 DIAGNOSIS — Z5181 Encounter for therapeutic drug level monitoring: Secondary | ICD-10-CM | POA: Diagnosis not present

## 2012-01-03 DIAGNOSIS — G4700 Insomnia, unspecified: Secondary | ICD-10-CM | POA: Diagnosis not present

## 2012-01-03 DIAGNOSIS — G589 Mononeuropathy, unspecified: Secondary | ICD-10-CM | POA: Diagnosis not present

## 2012-01-08 DIAGNOSIS — M47817 Spondylosis without myelopathy or radiculopathy, lumbosacral region: Secondary | ICD-10-CM | POA: Diagnosis not present

## 2012-01-08 DIAGNOSIS — M25569 Pain in unspecified knee: Secondary | ICD-10-CM | POA: Diagnosis not present

## 2012-01-08 DIAGNOSIS — M1710 Unilateral primary osteoarthritis, unspecified knee: Secondary | ICD-10-CM | POA: Diagnosis not present

## 2012-01-08 DIAGNOSIS — M1610 Unilateral primary osteoarthritis, unspecified hip: Secondary | ICD-10-CM | POA: Diagnosis not present

## 2012-01-08 DIAGNOSIS — M5137 Other intervertebral disc degeneration, lumbosacral region: Secondary | ICD-10-CM | POA: Diagnosis not present

## 2012-01-08 DIAGNOSIS — IMO0002 Reserved for concepts with insufficient information to code with codable children: Secondary | ICD-10-CM | POA: Diagnosis not present

## 2012-01-08 DIAGNOSIS — M5412 Radiculopathy, cervical region: Secondary | ICD-10-CM | POA: Diagnosis not present

## 2012-01-08 DIAGNOSIS — M961 Postlaminectomy syndrome, not elsewhere classified: Secondary | ICD-10-CM | POA: Diagnosis not present

## 2012-01-14 DIAGNOSIS — J449 Chronic obstructive pulmonary disease, unspecified: Secondary | ICD-10-CM | POA: Diagnosis not present

## 2012-01-15 DIAGNOSIS — Z5181 Encounter for therapeutic drug level monitoring: Secondary | ICD-10-CM | POA: Diagnosis not present

## 2012-01-15 DIAGNOSIS — Z7901 Long term (current) use of anticoagulants: Secondary | ICD-10-CM | POA: Diagnosis not present

## 2012-01-15 DIAGNOSIS — I4891 Unspecified atrial fibrillation: Secondary | ICD-10-CM | POA: Diagnosis not present

## 2012-01-28 DIAGNOSIS — R350 Frequency of micturition: Secondary | ICD-10-CM | POA: Diagnosis not present

## 2012-01-29 DIAGNOSIS — Z5181 Encounter for therapeutic drug level monitoring: Secondary | ICD-10-CM | POA: Diagnosis not present

## 2012-01-29 DIAGNOSIS — I4891 Unspecified atrial fibrillation: Secondary | ICD-10-CM | POA: Diagnosis not present

## 2012-01-29 DIAGNOSIS — Z7901 Long term (current) use of anticoagulants: Secondary | ICD-10-CM | POA: Diagnosis not present

## 2012-02-06 DIAGNOSIS — M1710 Unilateral primary osteoarthritis, unspecified knee: Secondary | ICD-10-CM | POA: Diagnosis not present

## 2012-02-06 DIAGNOSIS — M1610 Unilateral primary osteoarthritis, unspecified hip: Secondary | ICD-10-CM | POA: Diagnosis not present

## 2012-02-06 DIAGNOSIS — M961 Postlaminectomy syndrome, not elsewhere classified: Secondary | ICD-10-CM | POA: Diagnosis not present

## 2012-02-06 DIAGNOSIS — IMO0002 Reserved for concepts with insufficient information to code with codable children: Secondary | ICD-10-CM | POA: Diagnosis not present

## 2012-02-06 DIAGNOSIS — M25569 Pain in unspecified knee: Secondary | ICD-10-CM | POA: Diagnosis not present

## 2012-02-06 DIAGNOSIS — M5137 Other intervertebral disc degeneration, lumbosacral region: Secondary | ICD-10-CM | POA: Diagnosis not present

## 2012-02-06 DIAGNOSIS — M47817 Spondylosis without myelopathy or radiculopathy, lumbosacral region: Secondary | ICD-10-CM | POA: Diagnosis not present

## 2012-02-06 DIAGNOSIS — M5412 Radiculopathy, cervical region: Secondary | ICD-10-CM | POA: Diagnosis not present

## 2012-02-12 DIAGNOSIS — Z7901 Long term (current) use of anticoagulants: Secondary | ICD-10-CM | POA: Diagnosis not present

## 2012-02-12 DIAGNOSIS — I4891 Unspecified atrial fibrillation: Secondary | ICD-10-CM | POA: Diagnosis not present

## 2012-02-12 DIAGNOSIS — Z23 Encounter for immunization: Secondary | ICD-10-CM | POA: Diagnosis not present

## 2012-02-12 DIAGNOSIS — Z5181 Encounter for therapeutic drug level monitoring: Secondary | ICD-10-CM | POA: Diagnosis not present

## 2012-02-12 DIAGNOSIS — E119 Type 2 diabetes mellitus without complications: Secondary | ICD-10-CM | POA: Diagnosis not present

## 2012-02-19 DIAGNOSIS — E1129 Type 2 diabetes mellitus with other diabetic kidney complication: Secondary | ICD-10-CM | POA: Diagnosis not present

## 2012-02-19 DIAGNOSIS — Z6836 Body mass index (BMI) 36.0-36.9, adult: Secondary | ICD-10-CM | POA: Diagnosis not present

## 2012-02-19 DIAGNOSIS — Z794 Long term (current) use of insulin: Secondary | ICD-10-CM | POA: Diagnosis not present

## 2012-02-19 DIAGNOSIS — E1142 Type 2 diabetes mellitus with diabetic polyneuropathy: Secondary | ICD-10-CM | POA: Diagnosis not present

## 2012-02-19 DIAGNOSIS — E1165 Type 2 diabetes mellitus with hyperglycemia: Secondary | ICD-10-CM | POA: Diagnosis not present

## 2012-02-19 DIAGNOSIS — E785 Hyperlipidemia, unspecified: Secondary | ICD-10-CM | POA: Diagnosis not present

## 2012-02-19 DIAGNOSIS — N058 Unspecified nephritic syndrome with other morphologic changes: Secondary | ICD-10-CM | POA: Diagnosis not present

## 2012-02-20 DIAGNOSIS — K2990 Gastroduodenitis, unspecified, without bleeding: Secondary | ICD-10-CM | POA: Diagnosis not present

## 2012-02-20 DIAGNOSIS — K2970 Gastritis, unspecified, without bleeding: Secondary | ICD-10-CM | POA: Diagnosis not present

## 2012-02-20 DIAGNOSIS — M519 Unspecified thoracic, thoracolumbar and lumbosacral intervertebral disc disorder: Secondary | ICD-10-CM | POA: Diagnosis not present

## 2012-02-20 DIAGNOSIS — G589 Mononeuropathy, unspecified: Secondary | ICD-10-CM | POA: Diagnosis not present

## 2012-02-21 DIAGNOSIS — E119 Type 2 diabetes mellitus without complications: Secondary | ICD-10-CM | POA: Diagnosis not present

## 2012-02-21 DIAGNOSIS — E785 Hyperlipidemia, unspecified: Secondary | ICD-10-CM | POA: Diagnosis not present

## 2012-02-21 DIAGNOSIS — R9431 Abnormal electrocardiogram [ECG] [EKG]: Secondary | ICD-10-CM | POA: Diagnosis not present

## 2012-02-21 DIAGNOSIS — R079 Chest pain, unspecified: Secondary | ICD-10-CM | POA: Diagnosis not present

## 2012-02-21 DIAGNOSIS — I4891 Unspecified atrial fibrillation: Secondary | ICD-10-CM | POA: Diagnosis not present

## 2012-02-24 DIAGNOSIS — E119 Type 2 diabetes mellitus without complications: Secondary | ICD-10-CM | POA: Diagnosis not present

## 2012-02-24 DIAGNOSIS — K219 Gastro-esophageal reflux disease without esophagitis: Secondary | ICD-10-CM | POA: Diagnosis not present

## 2012-02-24 DIAGNOSIS — M549 Dorsalgia, unspecified: Secondary | ICD-10-CM | POA: Diagnosis not present

## 2012-02-24 DIAGNOSIS — J449 Chronic obstructive pulmonary disease, unspecified: Secondary | ICD-10-CM | POA: Diagnosis not present

## 2012-02-24 DIAGNOSIS — I4891 Unspecified atrial fibrillation: Secondary | ICD-10-CM | POA: Diagnosis not present

## 2012-02-24 DIAGNOSIS — F329 Major depressive disorder, single episode, unspecified: Secondary | ICD-10-CM | POA: Diagnosis not present

## 2012-02-24 DIAGNOSIS — F4310 Post-traumatic stress disorder, unspecified: Secondary | ICD-10-CM | POA: Diagnosis not present

## 2012-02-26 DIAGNOSIS — Z5181 Encounter for therapeutic drug level monitoring: Secondary | ICD-10-CM | POA: Diagnosis not present

## 2012-02-26 DIAGNOSIS — I4891 Unspecified atrial fibrillation: Secondary | ICD-10-CM | POA: Diagnosis not present

## 2012-02-26 DIAGNOSIS — Z7901 Long term (current) use of anticoagulants: Secondary | ICD-10-CM | POA: Diagnosis not present

## 2012-03-02 DIAGNOSIS — E1149 Type 2 diabetes mellitus with other diabetic neurological complication: Secondary | ICD-10-CM | POA: Diagnosis not present

## 2012-03-02 DIAGNOSIS — B351 Tinea unguium: Secondary | ICD-10-CM | POA: Diagnosis not present

## 2012-03-02 DIAGNOSIS — M79609 Pain in unspecified limb: Secondary | ICD-10-CM | POA: Diagnosis not present

## 2012-03-03 DIAGNOSIS — L570 Actinic keratosis: Secondary | ICD-10-CM | POA: Diagnosis not present

## 2012-03-05 DIAGNOSIS — M1710 Unilateral primary osteoarthritis, unspecified knee: Secondary | ICD-10-CM | POA: Diagnosis not present

## 2012-03-05 DIAGNOSIS — IMO0002 Reserved for concepts with insufficient information to code with codable children: Secondary | ICD-10-CM | POA: Diagnosis not present

## 2012-03-05 DIAGNOSIS — M47817 Spondylosis without myelopathy or radiculopathy, lumbosacral region: Secondary | ICD-10-CM | POA: Diagnosis not present

## 2012-03-05 DIAGNOSIS — M5137 Other intervertebral disc degeneration, lumbosacral region: Secondary | ICD-10-CM | POA: Diagnosis not present

## 2012-03-05 DIAGNOSIS — M5412 Radiculopathy, cervical region: Secondary | ICD-10-CM | POA: Diagnosis not present

## 2012-03-05 DIAGNOSIS — M25569 Pain in unspecified knee: Secondary | ICD-10-CM | POA: Diagnosis not present

## 2012-03-05 DIAGNOSIS — M961 Postlaminectomy syndrome, not elsewhere classified: Secondary | ICD-10-CM | POA: Diagnosis not present

## 2012-03-05 DIAGNOSIS — M1610 Unilateral primary osteoarthritis, unspecified hip: Secondary | ICD-10-CM | POA: Diagnosis not present

## 2012-03-10 DIAGNOSIS — I4891 Unspecified atrial fibrillation: Secondary | ICD-10-CM | POA: Diagnosis not present

## 2012-03-12 DIAGNOSIS — R55 Syncope and collapse: Secondary | ICD-10-CM | POA: Diagnosis not present

## 2012-03-12 DIAGNOSIS — R911 Solitary pulmonary nodule: Secondary | ICD-10-CM | POA: Diagnosis not present

## 2012-03-12 DIAGNOSIS — R0602 Shortness of breath: Secondary | ICD-10-CM | POA: Diagnosis not present

## 2012-03-18 DIAGNOSIS — Z7901 Long term (current) use of anticoagulants: Secondary | ICD-10-CM | POA: Diagnosis not present

## 2012-03-18 DIAGNOSIS — Z5181 Encounter for therapeutic drug level monitoring: Secondary | ICD-10-CM | POA: Diagnosis not present

## 2012-03-18 DIAGNOSIS — I4891 Unspecified atrial fibrillation: Secondary | ICD-10-CM | POA: Diagnosis not present

## 2012-03-23 DIAGNOSIS — K219 Gastro-esophageal reflux disease without esophagitis: Secondary | ICD-10-CM | POA: Diagnosis not present

## 2012-03-23 DIAGNOSIS — J449 Chronic obstructive pulmonary disease, unspecified: Secondary | ICD-10-CM | POA: Diagnosis not present

## 2012-03-23 DIAGNOSIS — I4891 Unspecified atrial fibrillation: Secondary | ICD-10-CM | POA: Diagnosis not present

## 2012-03-23 DIAGNOSIS — M549 Dorsalgia, unspecified: Secondary | ICD-10-CM | POA: Diagnosis not present

## 2012-03-23 DIAGNOSIS — E119 Type 2 diabetes mellitus without complications: Secondary | ICD-10-CM | POA: Diagnosis not present

## 2012-03-23 DIAGNOSIS — F4310 Post-traumatic stress disorder, unspecified: Secondary | ICD-10-CM | POA: Diagnosis not present

## 2012-03-23 DIAGNOSIS — F330 Major depressive disorder, recurrent, mild: Secondary | ICD-10-CM | POA: Diagnosis not present

## 2012-03-31 DIAGNOSIS — R059 Cough, unspecified: Secondary | ICD-10-CM | POA: Diagnosis not present

## 2012-03-31 DIAGNOSIS — R05 Cough: Secondary | ICD-10-CM | POA: Diagnosis not present

## 2012-03-31 DIAGNOSIS — R1084 Generalized abdominal pain: Secondary | ICD-10-CM | POA: Diagnosis not present

## 2012-03-31 DIAGNOSIS — J309 Allergic rhinitis, unspecified: Secondary | ICD-10-CM | POA: Diagnosis not present

## 2012-04-02 DIAGNOSIS — M25569 Pain in unspecified knee: Secondary | ICD-10-CM | POA: Diagnosis not present

## 2012-04-02 DIAGNOSIS — M961 Postlaminectomy syndrome, not elsewhere classified: Secondary | ICD-10-CM | POA: Diagnosis not present

## 2012-04-02 DIAGNOSIS — M5412 Radiculopathy, cervical region: Secondary | ICD-10-CM | POA: Diagnosis not present

## 2012-04-02 DIAGNOSIS — M5137 Other intervertebral disc degeneration, lumbosacral region: Secondary | ICD-10-CM | POA: Diagnosis not present

## 2012-04-02 DIAGNOSIS — M47817 Spondylosis without myelopathy or radiculopathy, lumbosacral region: Secondary | ICD-10-CM | POA: Diagnosis not present

## 2012-04-02 DIAGNOSIS — M1610 Unilateral primary osteoarthritis, unspecified hip: Secondary | ICD-10-CM | POA: Diagnosis not present

## 2012-04-02 DIAGNOSIS — M1710 Unilateral primary osteoarthritis, unspecified knee: Secondary | ICD-10-CM | POA: Diagnosis not present

## 2012-04-02 DIAGNOSIS — IMO0002 Reserved for concepts with insufficient information to code with codable children: Secondary | ICD-10-CM | POA: Diagnosis not present

## 2012-04-09 DIAGNOSIS — Z9981 Dependence on supplemental oxygen: Secondary | ICD-10-CM | POA: Diagnosis not present

## 2012-04-09 DIAGNOSIS — E119 Type 2 diabetes mellitus without complications: Secondary | ICD-10-CM | POA: Diagnosis not present

## 2012-04-09 DIAGNOSIS — Z8701 Personal history of pneumonia (recurrent): Secondary | ICD-10-CM | POA: Diagnosis not present

## 2012-04-09 DIAGNOSIS — R0609 Other forms of dyspnea: Secondary | ICD-10-CM | POA: Diagnosis not present

## 2012-04-09 DIAGNOSIS — R0602 Shortness of breath: Secondary | ICD-10-CM | POA: Diagnosis not present

## 2012-04-09 DIAGNOSIS — J449 Chronic obstructive pulmonary disease, unspecified: Secondary | ICD-10-CM | POA: Diagnosis not present

## 2012-04-09 DIAGNOSIS — I509 Heart failure, unspecified: Secondary | ICD-10-CM | POA: Diagnosis not present

## 2012-04-09 DIAGNOSIS — I10 Essential (primary) hypertension: Secondary | ICD-10-CM | POA: Diagnosis not present

## 2012-04-09 DIAGNOSIS — Z950 Presence of cardiac pacemaker: Secondary | ICD-10-CM | POA: Diagnosis not present

## 2012-04-09 DIAGNOSIS — R0989 Other specified symptoms and signs involving the circulatory and respiratory systems: Secondary | ICD-10-CM | POA: Diagnosis not present

## 2012-04-09 DIAGNOSIS — R791 Abnormal coagulation profile: Secondary | ICD-10-CM | POA: Diagnosis not present

## 2012-04-09 DIAGNOSIS — J441 Chronic obstructive pulmonary disease with (acute) exacerbation: Secondary | ICD-10-CM | POA: Diagnosis not present

## 2012-04-10 DIAGNOSIS — Z1272 Encounter for screening for malignant neoplasm of vagina: Secondary | ICD-10-CM | POA: Diagnosis not present

## 2012-04-10 DIAGNOSIS — N760 Acute vaginitis: Secondary | ICD-10-CM | POA: Diagnosis not present

## 2012-04-12 DIAGNOSIS — J449 Chronic obstructive pulmonary disease, unspecified: Secondary | ICD-10-CM | POA: Diagnosis not present

## 2012-04-13 DIAGNOSIS — I509 Heart failure, unspecified: Secondary | ICD-10-CM | POA: Diagnosis not present

## 2012-04-13 DIAGNOSIS — J9620 Acute and chronic respiratory failure, unspecified whether with hypoxia or hypercapnia: Secondary | ICD-10-CM | POA: Diagnosis not present

## 2012-04-13 DIAGNOSIS — G4733 Obstructive sleep apnea (adult) (pediatric): Secondary | ICD-10-CM | POA: Diagnosis not present

## 2012-04-13 DIAGNOSIS — I4891 Unspecified atrial fibrillation: Secondary | ICD-10-CM | POA: Diagnosis not present

## 2012-04-13 DIAGNOSIS — I5043 Acute on chronic combined systolic (congestive) and diastolic (congestive) heart failure: Secondary | ICD-10-CM | POA: Diagnosis not present

## 2012-04-13 DIAGNOSIS — R0602 Shortness of breath: Secondary | ICD-10-CM | POA: Diagnosis not present

## 2012-04-13 DIAGNOSIS — J441 Chronic obstructive pulmonary disease with (acute) exacerbation: Secondary | ICD-10-CM | POA: Diagnosis not present

## 2012-04-13 DIAGNOSIS — I209 Angina pectoris, unspecified: Secondary | ICD-10-CM | POA: Diagnosis not present

## 2012-04-13 DIAGNOSIS — J984 Other disorders of lung: Secondary | ICD-10-CM | POA: Diagnosis not present

## 2012-04-13 DIAGNOSIS — R55 Syncope and collapse: Secondary | ICD-10-CM | POA: Diagnosis not present

## 2012-04-14 DIAGNOSIS — J9600 Acute respiratory failure, unspecified whether with hypoxia or hypercapnia: Secondary | ICD-10-CM | POA: Diagnosis not present

## 2012-04-14 DIAGNOSIS — M60009 Infective myositis, unspecified site: Secondary | ICD-10-CM | POA: Diagnosis not present

## 2012-04-14 DIAGNOSIS — K219 Gastro-esophageal reflux disease without esophagitis: Secondary | ICD-10-CM | POA: Diagnosis present

## 2012-04-14 DIAGNOSIS — R197 Diarrhea, unspecified: Secondary | ICD-10-CM | POA: Diagnosis present

## 2012-04-14 DIAGNOSIS — M5137 Other intervertebral disc degeneration, lumbosacral region: Secondary | ICD-10-CM | POA: Diagnosis present

## 2012-04-14 DIAGNOSIS — I5043 Acute on chronic combined systolic (congestive) and diastolic (congestive) heart failure: Secondary | ICD-10-CM | POA: Diagnosis not present

## 2012-04-14 DIAGNOSIS — R4789 Other speech disturbances: Secondary | ICD-10-CM | POA: Diagnosis present

## 2012-04-14 DIAGNOSIS — Z9981 Dependence on supplemental oxygen: Secondary | ICD-10-CM | POA: Diagnosis not present

## 2012-04-14 DIAGNOSIS — E872 Acidosis: Secondary | ICD-10-CM | POA: Diagnosis present

## 2012-04-14 DIAGNOSIS — F411 Generalized anxiety disorder: Secondary | ICD-10-CM | POA: Diagnosis present

## 2012-04-14 DIAGNOSIS — F319 Bipolar disorder, unspecified: Secondary | ICD-10-CM | POA: Diagnosis present

## 2012-04-14 DIAGNOSIS — F4310 Post-traumatic stress disorder, unspecified: Secondary | ICD-10-CM | POA: Diagnosis present

## 2012-04-14 DIAGNOSIS — K449 Diaphragmatic hernia without obstruction or gangrene: Secondary | ICD-10-CM | POA: Diagnosis present

## 2012-04-14 DIAGNOSIS — E1142 Type 2 diabetes mellitus with diabetic polyneuropathy: Secondary | ICD-10-CM | POA: Diagnosis present

## 2012-04-14 DIAGNOSIS — J441 Chronic obstructive pulmonary disease with (acute) exacerbation: Secondary | ICD-10-CM | POA: Diagnosis not present

## 2012-04-14 DIAGNOSIS — E8881 Metabolic syndrome: Secondary | ICD-10-CM | POA: Diagnosis present

## 2012-04-14 DIAGNOSIS — J90 Pleural effusion, not elsewhere classified: Secondary | ICD-10-CM | POA: Diagnosis not present

## 2012-04-14 DIAGNOSIS — Z951 Presence of aortocoronary bypass graft: Secondary | ICD-10-CM | POA: Diagnosis not present

## 2012-04-14 DIAGNOSIS — I4891 Unspecified atrial fibrillation: Secondary | ICD-10-CM | POA: Diagnosis not present

## 2012-04-14 DIAGNOSIS — R55 Syncope and collapse: Secondary | ICD-10-CM | POA: Diagnosis not present

## 2012-04-14 DIAGNOSIS — G8929 Other chronic pain: Secondary | ICD-10-CM | POA: Diagnosis present

## 2012-04-14 DIAGNOSIS — Z6841 Body Mass Index (BMI) 40.0 and over, adult: Secondary | ICD-10-CM | POA: Diagnosis not present

## 2012-04-14 DIAGNOSIS — E1149 Type 2 diabetes mellitus with other diabetic neurological complication: Secondary | ICD-10-CM | POA: Diagnosis present

## 2012-04-14 DIAGNOSIS — I059 Rheumatic mitral valve disease, unspecified: Secondary | ICD-10-CM | POA: Diagnosis not present

## 2012-04-14 DIAGNOSIS — J9610 Chronic respiratory failure, unspecified whether with hypoxia or hypercapnia: Secondary | ICD-10-CM | POA: Diagnosis not present

## 2012-04-14 DIAGNOSIS — IMO0001 Reserved for inherently not codable concepts without codable children: Secondary | ICD-10-CM | POA: Diagnosis present

## 2012-04-14 DIAGNOSIS — I509 Heart failure, unspecified: Secondary | ICD-10-CM | POA: Diagnosis not present

## 2012-04-14 DIAGNOSIS — E7800 Pure hypercholesterolemia, unspecified: Secondary | ICD-10-CM | POA: Diagnosis present

## 2012-04-14 DIAGNOSIS — R0602 Shortness of breath: Secondary | ICD-10-CM | POA: Diagnosis not present

## 2012-04-21 DIAGNOSIS — Z7901 Long term (current) use of anticoagulants: Secondary | ICD-10-CM | POA: Diagnosis not present

## 2012-04-21 DIAGNOSIS — Z5181 Encounter for therapeutic drug level monitoring: Secondary | ICD-10-CM | POA: Diagnosis not present

## 2012-04-21 DIAGNOSIS — I4891 Unspecified atrial fibrillation: Secondary | ICD-10-CM | POA: Diagnosis not present

## 2012-04-24 DIAGNOSIS — J0100 Acute maxillary sinusitis, unspecified: Secondary | ICD-10-CM | POA: Diagnosis not present

## 2012-04-24 DIAGNOSIS — J441 Chronic obstructive pulmonary disease with (acute) exacerbation: Secondary | ICD-10-CM | POA: Diagnosis not present

## 2012-04-24 DIAGNOSIS — Z09 Encounter for follow-up examination after completed treatment for conditions other than malignant neoplasm: Secondary | ICD-10-CM | POA: Diagnosis not present

## 2012-04-28 DIAGNOSIS — J449 Chronic obstructive pulmonary disease, unspecified: Secondary | ICD-10-CM | POA: Diagnosis not present

## 2012-04-28 DIAGNOSIS — J9620 Acute and chronic respiratory failure, unspecified whether with hypoxia or hypercapnia: Secondary | ICD-10-CM | POA: Diagnosis not present

## 2012-04-28 DIAGNOSIS — IMO0002 Reserved for concepts with insufficient information to code with codable children: Secondary | ICD-10-CM | POA: Diagnosis not present

## 2012-04-28 DIAGNOSIS — I4891 Unspecified atrial fibrillation: Secondary | ICD-10-CM | POA: Diagnosis not present

## 2012-04-28 DIAGNOSIS — G4733 Obstructive sleep apnea (adult) (pediatric): Secondary | ICD-10-CM | POA: Diagnosis not present

## 2012-04-29 DIAGNOSIS — M961 Postlaminectomy syndrome, not elsewhere classified: Secondary | ICD-10-CM | POA: Diagnosis not present

## 2012-04-29 DIAGNOSIS — M12349 Palindromic rheumatism, unspecified hand: Secondary | ICD-10-CM | POA: Diagnosis not present

## 2012-04-29 DIAGNOSIS — M5412 Radiculopathy, cervical region: Secondary | ICD-10-CM | POA: Diagnosis not present

## 2012-05-01 DIAGNOSIS — M25519 Pain in unspecified shoulder: Secondary | ICD-10-CM | POA: Diagnosis not present

## 2012-05-04 DIAGNOSIS — M961 Postlaminectomy syndrome, not elsewhere classified: Secondary | ICD-10-CM | POA: Diagnosis not present

## 2012-05-04 DIAGNOSIS — I4891 Unspecified atrial fibrillation: Secondary | ICD-10-CM | POA: Diagnosis not present

## 2012-05-04 DIAGNOSIS — M5412 Radiculopathy, cervical region: Secondary | ICD-10-CM | POA: Diagnosis not present

## 2012-05-04 DIAGNOSIS — Z7901 Long term (current) use of anticoagulants: Secondary | ICD-10-CM | POA: Diagnosis not present

## 2012-05-04 DIAGNOSIS — M25519 Pain in unspecified shoulder: Secondary | ICD-10-CM | POA: Diagnosis not present

## 2012-05-04 DIAGNOSIS — Z5181 Encounter for therapeutic drug level monitoring: Secondary | ICD-10-CM | POA: Diagnosis not present

## 2012-05-06 DIAGNOSIS — M5412 Radiculopathy, cervical region: Secondary | ICD-10-CM | POA: Diagnosis not present

## 2012-05-06 DIAGNOSIS — M25519 Pain in unspecified shoulder: Secondary | ICD-10-CM | POA: Diagnosis not present

## 2012-05-06 DIAGNOSIS — M961 Postlaminectomy syndrome, not elsewhere classified: Secondary | ICD-10-CM | POA: Diagnosis not present

## 2012-05-08 DIAGNOSIS — M5412 Radiculopathy, cervical region: Secondary | ICD-10-CM | POA: Diagnosis not present

## 2012-05-08 DIAGNOSIS — M25519 Pain in unspecified shoulder: Secondary | ICD-10-CM | POA: Diagnosis not present

## 2012-05-08 DIAGNOSIS — M961 Postlaminectomy syndrome, not elsewhere classified: Secondary | ICD-10-CM | POA: Diagnosis not present

## 2012-05-13 DIAGNOSIS — M5412 Radiculopathy, cervical region: Secondary | ICD-10-CM | POA: Diagnosis not present

## 2012-05-13 DIAGNOSIS — Z5181 Encounter for therapeutic drug level monitoring: Secondary | ICD-10-CM | POA: Diagnosis not present

## 2012-05-13 DIAGNOSIS — M25519 Pain in unspecified shoulder: Secondary | ICD-10-CM | POA: Diagnosis not present

## 2012-05-13 DIAGNOSIS — Z7901 Long term (current) use of anticoagulants: Secondary | ICD-10-CM | POA: Diagnosis not present

## 2012-05-13 DIAGNOSIS — I4891 Unspecified atrial fibrillation: Secondary | ICD-10-CM | POA: Diagnosis not present

## 2012-05-13 DIAGNOSIS — M961 Postlaminectomy syndrome, not elsewhere classified: Secondary | ICD-10-CM | POA: Diagnosis not present

## 2012-05-14 DIAGNOSIS — M25519 Pain in unspecified shoulder: Secondary | ICD-10-CM | POA: Diagnosis not present

## 2012-05-14 DIAGNOSIS — M961 Postlaminectomy syndrome, not elsewhere classified: Secondary | ICD-10-CM | POA: Diagnosis not present

## 2012-05-14 DIAGNOSIS — M5412 Radiculopathy, cervical region: Secondary | ICD-10-CM | POA: Diagnosis not present

## 2012-05-15 DIAGNOSIS — M5412 Radiculopathy, cervical region: Secondary | ICD-10-CM | POA: Diagnosis not present

## 2012-05-15 DIAGNOSIS — M961 Postlaminectomy syndrome, not elsewhere classified: Secondary | ICD-10-CM | POA: Diagnosis not present

## 2012-05-15 DIAGNOSIS — M25519 Pain in unspecified shoulder: Secondary | ICD-10-CM | POA: Diagnosis not present

## 2012-05-16 DIAGNOSIS — Z09 Encounter for follow-up examination after completed treatment for conditions other than malignant neoplasm: Secondary | ICD-10-CM | POA: Diagnosis not present

## 2012-05-16 DIAGNOSIS — J441 Chronic obstructive pulmonary disease with (acute) exacerbation: Secondary | ICD-10-CM | POA: Diagnosis not present

## 2012-05-16 DIAGNOSIS — J0100 Acute maxillary sinusitis, unspecified: Secondary | ICD-10-CM | POA: Diagnosis not present

## 2012-05-18 DIAGNOSIS — M961 Postlaminectomy syndrome, not elsewhere classified: Secondary | ICD-10-CM | POA: Diagnosis not present

## 2012-05-18 DIAGNOSIS — M25519 Pain in unspecified shoulder: Secondary | ICD-10-CM | POA: Diagnosis not present

## 2012-05-18 DIAGNOSIS — M5412 Radiculopathy, cervical region: Secondary | ICD-10-CM | POA: Diagnosis not present

## 2012-05-20 DIAGNOSIS — M25519 Pain in unspecified shoulder: Secondary | ICD-10-CM | POA: Diagnosis not present

## 2012-05-20 DIAGNOSIS — M961 Postlaminectomy syndrome, not elsewhere classified: Secondary | ICD-10-CM | POA: Diagnosis not present

## 2012-05-20 DIAGNOSIS — M5412 Radiculopathy, cervical region: Secondary | ICD-10-CM | POA: Diagnosis not present

## 2012-05-21 DIAGNOSIS — E1165 Type 2 diabetes mellitus with hyperglycemia: Secondary | ICD-10-CM | POA: Diagnosis not present

## 2012-05-21 DIAGNOSIS — E782 Mixed hyperlipidemia: Secondary | ICD-10-CM | POA: Diagnosis not present

## 2012-05-21 DIAGNOSIS — E1129 Type 2 diabetes mellitus with other diabetic kidney complication: Secondary | ICD-10-CM | POA: Diagnosis not present

## 2012-05-22 DIAGNOSIS — E1142 Type 2 diabetes mellitus with diabetic polyneuropathy: Secondary | ICD-10-CM | POA: Diagnosis not present

## 2012-05-22 DIAGNOSIS — Z6841 Body Mass Index (BMI) 40.0 and over, adult: Secondary | ICD-10-CM | POA: Diagnosis not present

## 2012-05-22 DIAGNOSIS — N058 Unspecified nephritic syndrome with other morphologic changes: Secondary | ICD-10-CM | POA: Diagnosis not present

## 2012-05-22 DIAGNOSIS — M25519 Pain in unspecified shoulder: Secondary | ICD-10-CM | POA: Diagnosis not present

## 2012-05-22 DIAGNOSIS — Z794 Long term (current) use of insulin: Secondary | ICD-10-CM | POA: Diagnosis not present

## 2012-05-22 DIAGNOSIS — M6790 Unspecified disorder of synovium and tendon, unspecified site: Secondary | ICD-10-CM | POA: Diagnosis not present

## 2012-05-22 DIAGNOSIS — M5412 Radiculopathy, cervical region: Secondary | ICD-10-CM | POA: Diagnosis not present

## 2012-05-22 DIAGNOSIS — E785 Hyperlipidemia, unspecified: Secondary | ICD-10-CM | POA: Diagnosis not present

## 2012-05-22 DIAGNOSIS — M719 Bursopathy, unspecified: Secondary | ICD-10-CM | POA: Diagnosis not present

## 2012-05-22 DIAGNOSIS — M961 Postlaminectomy syndrome, not elsewhere classified: Secondary | ICD-10-CM | POA: Diagnosis not present

## 2012-05-22 DIAGNOSIS — E1149 Type 2 diabetes mellitus with other diabetic neurological complication: Secondary | ICD-10-CM | POA: Diagnosis not present

## 2012-05-25 DIAGNOSIS — M6790 Unspecified disorder of synovium and tendon, unspecified site: Secondary | ICD-10-CM | POA: Diagnosis not present

## 2012-05-25 DIAGNOSIS — M5412 Radiculopathy, cervical region: Secondary | ICD-10-CM | POA: Diagnosis not present

## 2012-05-25 DIAGNOSIS — M961 Postlaminectomy syndrome, not elsewhere classified: Secondary | ICD-10-CM | POA: Diagnosis not present

## 2012-05-25 DIAGNOSIS — M25519 Pain in unspecified shoulder: Secondary | ICD-10-CM | POA: Diagnosis not present

## 2012-05-26 DIAGNOSIS — M6790 Unspecified disorder of synovium and tendon, unspecified site: Secondary | ICD-10-CM | POA: Diagnosis not present

## 2012-05-26 DIAGNOSIS — M25519 Pain in unspecified shoulder: Secondary | ICD-10-CM | POA: Diagnosis not present

## 2012-05-26 DIAGNOSIS — M961 Postlaminectomy syndrome, not elsewhere classified: Secondary | ICD-10-CM | POA: Diagnosis not present

## 2012-05-26 DIAGNOSIS — M5412 Radiculopathy, cervical region: Secondary | ICD-10-CM | POA: Diagnosis not present

## 2012-05-29 DIAGNOSIS — M25519 Pain in unspecified shoulder: Secondary | ICD-10-CM | POA: Diagnosis not present

## 2012-05-29 DIAGNOSIS — M961 Postlaminectomy syndrome, not elsewhere classified: Secondary | ICD-10-CM | POA: Diagnosis not present

## 2012-05-29 DIAGNOSIS — M5412 Radiculopathy, cervical region: Secondary | ICD-10-CM | POA: Diagnosis not present

## 2012-05-29 DIAGNOSIS — M719 Bursopathy, unspecified: Secondary | ICD-10-CM | POA: Diagnosis not present

## 2012-05-29 DIAGNOSIS — M6790 Unspecified disorder of synovium and tendon, unspecified site: Secondary | ICD-10-CM | POA: Diagnosis not present

## 2012-06-01 DIAGNOSIS — M25519 Pain in unspecified shoulder: Secondary | ICD-10-CM | POA: Diagnosis not present

## 2012-06-01 DIAGNOSIS — M961 Postlaminectomy syndrome, not elsewhere classified: Secondary | ICD-10-CM | POA: Diagnosis not present

## 2012-06-01 DIAGNOSIS — M6790 Unspecified disorder of synovium and tendon, unspecified site: Secondary | ICD-10-CM | POA: Diagnosis not present

## 2012-06-01 DIAGNOSIS — M719 Bursopathy, unspecified: Secondary | ICD-10-CM | POA: Diagnosis not present

## 2012-06-01 DIAGNOSIS — M5412 Radiculopathy, cervical region: Secondary | ICD-10-CM | POA: Diagnosis not present

## 2012-06-04 DIAGNOSIS — M519 Unspecified thoracic, thoracolumbar and lumbosacral intervertebral disc disorder: Secondary | ICD-10-CM | POA: Diagnosis not present

## 2012-06-04 DIAGNOSIS — M25519 Pain in unspecified shoulder: Secondary | ICD-10-CM | POA: Diagnosis not present

## 2012-06-05 DIAGNOSIS — M25519 Pain in unspecified shoulder: Secondary | ICD-10-CM | POA: Diagnosis not present

## 2012-06-05 DIAGNOSIS — M519 Unspecified thoracic, thoracolumbar and lumbosacral intervertebral disc disorder: Secondary | ICD-10-CM | POA: Diagnosis not present

## 2012-06-08 DIAGNOSIS — M519 Unspecified thoracic, thoracolumbar and lumbosacral intervertebral disc disorder: Secondary | ICD-10-CM | POA: Diagnosis not present

## 2012-06-08 DIAGNOSIS — M25519 Pain in unspecified shoulder: Secondary | ICD-10-CM | POA: Diagnosis not present

## 2012-06-09 DIAGNOSIS — Z5181 Encounter for therapeutic drug level monitoring: Secondary | ICD-10-CM | POA: Diagnosis not present

## 2012-06-09 DIAGNOSIS — I4891 Unspecified atrial fibrillation: Secondary | ICD-10-CM | POA: Diagnosis not present

## 2012-06-09 DIAGNOSIS — Z7901 Long term (current) use of anticoagulants: Secondary | ICD-10-CM | POA: Diagnosis not present

## 2012-06-10 DIAGNOSIS — M25519 Pain in unspecified shoulder: Secondary | ICD-10-CM | POA: Diagnosis not present

## 2012-06-10 DIAGNOSIS — M519 Unspecified thoracic, thoracolumbar and lumbosacral intervertebral disc disorder: Secondary | ICD-10-CM | POA: Diagnosis not present

## 2012-06-12 DIAGNOSIS — M519 Unspecified thoracic, thoracolumbar and lumbosacral intervertebral disc disorder: Secondary | ICD-10-CM | POA: Diagnosis not present

## 2012-06-12 DIAGNOSIS — M25519 Pain in unspecified shoulder: Secondary | ICD-10-CM | POA: Diagnosis not present

## 2012-06-15 DIAGNOSIS — E119 Type 2 diabetes mellitus without complications: Secondary | ICD-10-CM | POA: Diagnosis not present

## 2012-06-15 DIAGNOSIS — R9431 Abnormal electrocardiogram [ECG] [EKG]: Secondary | ICD-10-CM | POA: Diagnosis not present

## 2012-06-15 DIAGNOSIS — I4891 Unspecified atrial fibrillation: Secondary | ICD-10-CM | POA: Diagnosis not present

## 2012-06-15 DIAGNOSIS — E785 Hyperlipidemia, unspecified: Secondary | ICD-10-CM | POA: Diagnosis not present

## 2012-06-15 DIAGNOSIS — I519 Heart disease, unspecified: Secondary | ICD-10-CM | POA: Diagnosis not present

## 2012-06-16 DIAGNOSIS — R3915 Urgency of urination: Secondary | ICD-10-CM | POA: Diagnosis not present

## 2012-06-16 DIAGNOSIS — M519 Unspecified thoracic, thoracolumbar and lumbosacral intervertebral disc disorder: Secondary | ICD-10-CM | POA: Diagnosis not present

## 2012-06-16 DIAGNOSIS — R350 Frequency of micturition: Secondary | ICD-10-CM | POA: Diagnosis not present

## 2012-06-16 DIAGNOSIS — M25519 Pain in unspecified shoulder: Secondary | ICD-10-CM | POA: Diagnosis not present

## 2012-06-17 DIAGNOSIS — E669 Obesity, unspecified: Secondary | ICD-10-CM | POA: Diagnosis not present

## 2012-06-17 DIAGNOSIS — I4891 Unspecified atrial fibrillation: Secondary | ICD-10-CM | POA: Diagnosis not present

## 2012-06-17 DIAGNOSIS — E119 Type 2 diabetes mellitus without complications: Secondary | ICD-10-CM | POA: Diagnosis not present

## 2012-06-17 DIAGNOSIS — M519 Unspecified thoracic, thoracolumbar and lumbosacral intervertebral disc disorder: Secondary | ICD-10-CM | POA: Diagnosis not present

## 2012-06-17 DIAGNOSIS — M25519 Pain in unspecified shoulder: Secondary | ICD-10-CM | POA: Diagnosis not present

## 2012-06-17 DIAGNOSIS — F330 Major depressive disorder, recurrent, mild: Secondary | ICD-10-CM | POA: Diagnosis not present

## 2012-06-17 DIAGNOSIS — F4310 Post-traumatic stress disorder, unspecified: Secondary | ICD-10-CM | POA: Diagnosis not present

## 2012-06-17 DIAGNOSIS — J449 Chronic obstructive pulmonary disease, unspecified: Secondary | ICD-10-CM | POA: Diagnosis not present

## 2012-06-17 DIAGNOSIS — K219 Gastro-esophageal reflux disease without esophagitis: Secondary | ICD-10-CM | POA: Diagnosis not present

## 2012-06-18 DIAGNOSIS — E1139 Type 2 diabetes mellitus with other diabetic ophthalmic complication: Secondary | ICD-10-CM | POA: Diagnosis not present

## 2012-06-18 DIAGNOSIS — H5340 Unspecified visual field defects: Secondary | ICD-10-CM | POA: Diagnosis not present

## 2012-06-18 DIAGNOSIS — E11319 Type 2 diabetes mellitus with unspecified diabetic retinopathy without macular edema: Secondary | ICD-10-CM | POA: Diagnosis not present

## 2012-06-18 DIAGNOSIS — H40019 Open angle with borderline findings, low risk, unspecified eye: Secondary | ICD-10-CM | POA: Diagnosis not present

## 2012-06-19 DIAGNOSIS — M25519 Pain in unspecified shoulder: Secondary | ICD-10-CM | POA: Diagnosis not present

## 2012-06-19 DIAGNOSIS — M519 Unspecified thoracic, thoracolumbar and lumbosacral intervertebral disc disorder: Secondary | ICD-10-CM | POA: Diagnosis not present

## 2012-06-19 DIAGNOSIS — Z5181 Encounter for therapeutic drug level monitoring: Secondary | ICD-10-CM | POA: Diagnosis not present

## 2012-06-19 DIAGNOSIS — Z7901 Long term (current) use of anticoagulants: Secondary | ICD-10-CM | POA: Diagnosis not present

## 2012-06-19 DIAGNOSIS — I4891 Unspecified atrial fibrillation: Secondary | ICD-10-CM | POA: Diagnosis not present

## 2012-06-22 DIAGNOSIS — M25519 Pain in unspecified shoulder: Secondary | ICD-10-CM | POA: Diagnosis not present

## 2012-06-22 DIAGNOSIS — M519 Unspecified thoracic, thoracolumbar and lumbosacral intervertebral disc disorder: Secondary | ICD-10-CM | POA: Diagnosis not present

## 2012-06-25 DIAGNOSIS — M47817 Spondylosis without myelopathy or radiculopathy, lumbosacral region: Secondary | ICD-10-CM | POA: Diagnosis not present

## 2012-06-25 DIAGNOSIS — M1710 Unilateral primary osteoarthritis, unspecified knee: Secondary | ICD-10-CM | POA: Diagnosis not present

## 2012-06-25 DIAGNOSIS — M25569 Pain in unspecified knee: Secondary | ICD-10-CM | POA: Diagnosis not present

## 2012-06-25 DIAGNOSIS — M5412 Radiculopathy, cervical region: Secondary | ICD-10-CM | POA: Diagnosis not present

## 2012-06-25 DIAGNOSIS — M5137 Other intervertebral disc degeneration, lumbosacral region: Secondary | ICD-10-CM | POA: Diagnosis not present

## 2012-06-25 DIAGNOSIS — M961 Postlaminectomy syndrome, not elsewhere classified: Secondary | ICD-10-CM | POA: Diagnosis not present

## 2012-06-25 DIAGNOSIS — IMO0002 Reserved for concepts with insufficient information to code with codable children: Secondary | ICD-10-CM | POA: Diagnosis not present

## 2012-06-25 DIAGNOSIS — M1610 Unilateral primary osteoarthritis, unspecified hip: Secondary | ICD-10-CM | POA: Diagnosis not present

## 2012-06-26 DIAGNOSIS — M25519 Pain in unspecified shoulder: Secondary | ICD-10-CM | POA: Diagnosis not present

## 2012-06-26 DIAGNOSIS — M519 Unspecified thoracic, thoracolumbar and lumbosacral intervertebral disc disorder: Secondary | ICD-10-CM | POA: Diagnosis not present

## 2012-06-27 DIAGNOSIS — M25519 Pain in unspecified shoulder: Secondary | ICD-10-CM | POA: Diagnosis not present

## 2012-06-27 DIAGNOSIS — M519 Unspecified thoracic, thoracolumbar and lumbosacral intervertebral disc disorder: Secondary | ICD-10-CM | POA: Diagnosis not present

## 2012-06-30 DIAGNOSIS — Z5181 Encounter for therapeutic drug level monitoring: Secondary | ICD-10-CM | POA: Diagnosis not present

## 2012-06-30 DIAGNOSIS — Z7901 Long term (current) use of anticoagulants: Secondary | ICD-10-CM | POA: Diagnosis not present

## 2012-06-30 DIAGNOSIS — M519 Unspecified thoracic, thoracolumbar and lumbosacral intervertebral disc disorder: Secondary | ICD-10-CM | POA: Diagnosis not present

## 2012-06-30 DIAGNOSIS — M25519 Pain in unspecified shoulder: Secondary | ICD-10-CM | POA: Diagnosis not present

## 2012-06-30 DIAGNOSIS — I4891 Unspecified atrial fibrillation: Secondary | ICD-10-CM | POA: Diagnosis not present

## 2012-07-01 DIAGNOSIS — M519 Unspecified thoracic, thoracolumbar and lumbosacral intervertebral disc disorder: Secondary | ICD-10-CM | POA: Diagnosis not present

## 2012-07-01 DIAGNOSIS — J0100 Acute maxillary sinusitis, unspecified: Secondary | ICD-10-CM | POA: Diagnosis not present

## 2012-07-01 DIAGNOSIS — M25519 Pain in unspecified shoulder: Secondary | ICD-10-CM | POA: Diagnosis not present

## 2012-07-01 DIAGNOSIS — E878 Other disorders of electrolyte and fluid balance, not elsewhere classified: Secondary | ICD-10-CM | POA: Diagnosis not present

## 2012-07-03 DIAGNOSIS — M25519 Pain in unspecified shoulder: Secondary | ICD-10-CM | POA: Diagnosis not present

## 2012-07-03 DIAGNOSIS — M519 Unspecified thoracic, thoracolumbar and lumbosacral intervertebral disc disorder: Secondary | ICD-10-CM | POA: Diagnosis not present

## 2012-07-08 DIAGNOSIS — M25519 Pain in unspecified shoulder: Secondary | ICD-10-CM | POA: Diagnosis not present

## 2012-07-08 DIAGNOSIS — M519 Unspecified thoracic, thoracolumbar and lumbosacral intervertebral disc disorder: Secondary | ICD-10-CM | POA: Diagnosis not present

## 2012-07-09 DIAGNOSIS — M25519 Pain in unspecified shoulder: Secondary | ICD-10-CM | POA: Diagnosis not present

## 2012-07-09 DIAGNOSIS — G5600 Carpal tunnel syndrome, unspecified upper limb: Secondary | ICD-10-CM | POA: Diagnosis not present

## 2012-07-09 DIAGNOSIS — M519 Unspecified thoracic, thoracolumbar and lumbosacral intervertebral disc disorder: Secondary | ICD-10-CM | POA: Diagnosis not present

## 2012-07-10 DIAGNOSIS — M519 Unspecified thoracic, thoracolumbar and lumbosacral intervertebral disc disorder: Secondary | ICD-10-CM | POA: Diagnosis not present

## 2012-07-10 DIAGNOSIS — M25519 Pain in unspecified shoulder: Secondary | ICD-10-CM | POA: Diagnosis not present

## 2012-07-13 DIAGNOSIS — M25519 Pain in unspecified shoulder: Secondary | ICD-10-CM | POA: Diagnosis not present

## 2012-07-13 DIAGNOSIS — M519 Unspecified thoracic, thoracolumbar and lumbosacral intervertebral disc disorder: Secondary | ICD-10-CM | POA: Diagnosis not present

## 2012-07-15 DIAGNOSIS — M25519 Pain in unspecified shoulder: Secondary | ICD-10-CM | POA: Diagnosis not present

## 2012-07-15 DIAGNOSIS — M519 Unspecified thoracic, thoracolumbar and lumbosacral intervertebral disc disorder: Secondary | ICD-10-CM | POA: Diagnosis not present

## 2012-07-17 DIAGNOSIS — M519 Unspecified thoracic, thoracolumbar and lumbosacral intervertebral disc disorder: Secondary | ICD-10-CM | POA: Diagnosis not present

## 2012-07-17 DIAGNOSIS — M25519 Pain in unspecified shoulder: Secondary | ICD-10-CM | POA: Diagnosis not present

## 2012-07-20 DIAGNOSIS — M519 Unspecified thoracic, thoracolumbar and lumbosacral intervertebral disc disorder: Secondary | ICD-10-CM | POA: Diagnosis not present

## 2012-07-20 DIAGNOSIS — M25519 Pain in unspecified shoulder: Secondary | ICD-10-CM | POA: Diagnosis not present

## 2012-07-22 DIAGNOSIS — M519 Unspecified thoracic, thoracolumbar and lumbosacral intervertebral disc disorder: Secondary | ICD-10-CM | POA: Diagnosis not present

## 2012-07-22 DIAGNOSIS — I4891 Unspecified atrial fibrillation: Secondary | ICD-10-CM | POA: Diagnosis not present

## 2012-07-22 DIAGNOSIS — M25519 Pain in unspecified shoulder: Secondary | ICD-10-CM | POA: Diagnosis not present

## 2012-07-22 DIAGNOSIS — Z7901 Long term (current) use of anticoagulants: Secondary | ICD-10-CM | POA: Diagnosis not present

## 2012-07-22 DIAGNOSIS — Z5181 Encounter for therapeutic drug level monitoring: Secondary | ICD-10-CM | POA: Diagnosis not present

## 2012-07-23 DIAGNOSIS — M25569 Pain in unspecified knee: Secondary | ICD-10-CM | POA: Diagnosis not present

## 2012-07-23 DIAGNOSIS — M5137 Other intervertebral disc degeneration, lumbosacral region: Secondary | ICD-10-CM | POA: Diagnosis not present

## 2012-07-23 DIAGNOSIS — G4710 Hypersomnia, unspecified: Secondary | ICD-10-CM | POA: Diagnosis not present

## 2012-07-23 DIAGNOSIS — J4489 Other specified chronic obstructive pulmonary disease: Secondary | ICD-10-CM | POA: Diagnosis not present

## 2012-07-23 DIAGNOSIS — M5412 Radiculopathy, cervical region: Secondary | ICD-10-CM | POA: Diagnosis not present

## 2012-07-23 DIAGNOSIS — J9610 Chronic respiratory failure, unspecified whether with hypoxia or hypercapnia: Secondary | ICD-10-CM | POA: Diagnosis not present

## 2012-07-23 DIAGNOSIS — M961 Postlaminectomy syndrome, not elsewhere classified: Secondary | ICD-10-CM | POA: Diagnosis not present

## 2012-07-23 DIAGNOSIS — M47817 Spondylosis without myelopathy or radiculopathy, lumbosacral region: Secondary | ICD-10-CM | POA: Diagnosis not present

## 2012-07-23 DIAGNOSIS — IMO0002 Reserved for concepts with insufficient information to code with codable children: Secondary | ICD-10-CM | POA: Diagnosis not present

## 2012-07-23 DIAGNOSIS — J449 Chronic obstructive pulmonary disease, unspecified: Secondary | ICD-10-CM | POA: Diagnosis not present

## 2012-07-23 DIAGNOSIS — G4733 Obstructive sleep apnea (adult) (pediatric): Secondary | ICD-10-CM | POA: Diagnosis not present

## 2012-07-23 DIAGNOSIS — M1610 Unilateral primary osteoarthritis, unspecified hip: Secondary | ICD-10-CM | POA: Diagnosis not present

## 2012-07-28 DIAGNOSIS — M519 Unspecified thoracic, thoracolumbar and lumbosacral intervertebral disc disorder: Secondary | ICD-10-CM | POA: Diagnosis not present

## 2012-07-28 DIAGNOSIS — M25519 Pain in unspecified shoulder: Secondary | ICD-10-CM | POA: Diagnosis not present

## 2012-07-29 DIAGNOSIS — M519 Unspecified thoracic, thoracolumbar and lumbosacral intervertebral disc disorder: Secondary | ICD-10-CM | POA: Diagnosis not present

## 2012-07-29 DIAGNOSIS — M25519 Pain in unspecified shoulder: Secondary | ICD-10-CM | POA: Diagnosis not present

## 2012-07-30 DIAGNOSIS — E1149 Type 2 diabetes mellitus with other diabetic neurological complication: Secondary | ICD-10-CM | POA: Diagnosis not present

## 2012-07-30 DIAGNOSIS — B351 Tinea unguium: Secondary | ICD-10-CM | POA: Diagnosis not present

## 2012-07-30 DIAGNOSIS — L84 Corns and callosities: Secondary | ICD-10-CM | POA: Diagnosis not present

## 2012-07-30 DIAGNOSIS — M79609 Pain in unspecified limb: Secondary | ICD-10-CM | POA: Diagnosis not present

## 2012-08-16 DIAGNOSIS — J029 Acute pharyngitis, unspecified: Secondary | ICD-10-CM | POA: Diagnosis not present

## 2012-08-19 DIAGNOSIS — Z5181 Encounter for therapeutic drug level monitoring: Secondary | ICD-10-CM | POA: Diagnosis not present

## 2012-08-19 DIAGNOSIS — Z7901 Long term (current) use of anticoagulants: Secondary | ICD-10-CM | POA: Diagnosis not present

## 2012-08-19 DIAGNOSIS — J029 Acute pharyngitis, unspecified: Secondary | ICD-10-CM | POA: Diagnosis not present

## 2012-08-19 DIAGNOSIS — I4891 Unspecified atrial fibrillation: Secondary | ICD-10-CM | POA: Diagnosis not present

## 2012-08-20 DIAGNOSIS — M5137 Other intervertebral disc degeneration, lumbosacral region: Secondary | ICD-10-CM | POA: Diagnosis not present

## 2012-08-20 DIAGNOSIS — M25569 Pain in unspecified knee: Secondary | ICD-10-CM | POA: Diagnosis not present

## 2012-08-20 DIAGNOSIS — G5600 Carpal tunnel syndrome, unspecified upper limb: Secondary | ICD-10-CM | POA: Diagnosis not present

## 2012-08-20 DIAGNOSIS — M1610 Unilateral primary osteoarthritis, unspecified hip: Secondary | ICD-10-CM | POA: Diagnosis not present

## 2012-08-20 DIAGNOSIS — M159 Polyosteoarthritis, unspecified: Secondary | ICD-10-CM | POA: Diagnosis not present

## 2012-08-20 DIAGNOSIS — M1710 Unilateral primary osteoarthritis, unspecified knee: Secondary | ICD-10-CM | POA: Diagnosis not present

## 2012-08-20 DIAGNOSIS — M961 Postlaminectomy syndrome, not elsewhere classified: Secondary | ICD-10-CM | POA: Diagnosis not present

## 2012-08-20 DIAGNOSIS — IMO0002 Reserved for concepts with insufficient information to code with codable children: Secondary | ICD-10-CM | POA: Diagnosis not present

## 2012-08-20 DIAGNOSIS — M5412 Radiculopathy, cervical region: Secondary | ICD-10-CM | POA: Diagnosis not present

## 2012-09-01 DIAGNOSIS — M75100 Unspecified rotator cuff tear or rupture of unspecified shoulder, not specified as traumatic: Secondary | ICD-10-CM | POA: Diagnosis not present

## 2012-09-01 DIAGNOSIS — G589 Mononeuropathy, unspecified: Secondary | ICD-10-CM | POA: Diagnosis not present

## 2012-09-04 DIAGNOSIS — Z7901 Long term (current) use of anticoagulants: Secondary | ICD-10-CM | POA: Diagnosis not present

## 2012-09-04 DIAGNOSIS — I4891 Unspecified atrial fibrillation: Secondary | ICD-10-CM | POA: Diagnosis not present

## 2012-09-04 DIAGNOSIS — Z5181 Encounter for therapeutic drug level monitoring: Secondary | ICD-10-CM | POA: Diagnosis not present

## 2012-09-07 DIAGNOSIS — N058 Unspecified nephritic syndrome with other morphologic changes: Secondary | ICD-10-CM | POA: Diagnosis not present

## 2012-09-07 DIAGNOSIS — Z5181 Encounter for therapeutic drug level monitoring: Secondary | ICD-10-CM | POA: Diagnosis not present

## 2012-09-07 DIAGNOSIS — Z7901 Long term (current) use of anticoagulants: Secondary | ICD-10-CM | POA: Diagnosis not present

## 2012-09-07 DIAGNOSIS — E1149 Type 2 diabetes mellitus with other diabetic neurological complication: Secondary | ICD-10-CM | POA: Diagnosis not present

## 2012-09-07 DIAGNOSIS — I4891 Unspecified atrial fibrillation: Secondary | ICD-10-CM | POA: Diagnosis not present

## 2012-09-08 DIAGNOSIS — M79609 Pain in unspecified limb: Secondary | ICD-10-CM | POA: Diagnosis not present

## 2012-09-08 DIAGNOSIS — L03039 Cellulitis of unspecified toe: Secondary | ICD-10-CM | POA: Diagnosis not present

## 2012-09-16 DIAGNOSIS — I509 Heart failure, unspecified: Secondary | ICD-10-CM | POA: Diagnosis not present

## 2012-09-16 DIAGNOSIS — G4733 Obstructive sleep apnea (adult) (pediatric): Secondary | ICD-10-CM | POA: Diagnosis not present

## 2012-09-16 DIAGNOSIS — IMO0002 Reserved for concepts with insufficient information to code with codable children: Secondary | ICD-10-CM | POA: Diagnosis not present

## 2012-09-16 DIAGNOSIS — E669 Obesity, unspecified: Secondary | ICD-10-CM | POA: Diagnosis not present

## 2012-09-16 DIAGNOSIS — F4310 Post-traumatic stress disorder, unspecified: Secondary | ICD-10-CM | POA: Diagnosis not present

## 2012-09-16 DIAGNOSIS — I4891 Unspecified atrial fibrillation: Secondary | ICD-10-CM | POA: Diagnosis not present

## 2012-09-17 DIAGNOSIS — G5600 Carpal tunnel syndrome, unspecified upper limb: Secondary | ICD-10-CM | POA: Diagnosis not present

## 2012-09-17 DIAGNOSIS — M5137 Other intervertebral disc degeneration, lumbosacral region: Secondary | ICD-10-CM | POA: Diagnosis not present

## 2012-09-17 DIAGNOSIS — M47817 Spondylosis without myelopathy or radiculopathy, lumbosacral region: Secondary | ICD-10-CM | POA: Diagnosis not present

## 2012-09-17 DIAGNOSIS — M961 Postlaminectomy syndrome, not elsewhere classified: Secondary | ICD-10-CM | POA: Diagnosis not present

## 2012-09-17 DIAGNOSIS — M5412 Radiculopathy, cervical region: Secondary | ICD-10-CM | POA: Diagnosis not present

## 2012-09-17 DIAGNOSIS — IMO0002 Reserved for concepts with insufficient information to code with codable children: Secondary | ICD-10-CM | POA: Diagnosis not present

## 2012-09-17 DIAGNOSIS — M545 Low back pain, unspecified: Secondary | ICD-10-CM | POA: Diagnosis not present

## 2012-09-17 DIAGNOSIS — G589 Mononeuropathy, unspecified: Secondary | ICD-10-CM | POA: Diagnosis not present

## 2012-09-17 DIAGNOSIS — M25569 Pain in unspecified knee: Secondary | ICD-10-CM | POA: Diagnosis not present

## 2012-09-17 DIAGNOSIS — Z79899 Other long term (current) drug therapy: Secondary | ICD-10-CM | POA: Diagnosis not present

## 2012-09-23 DIAGNOSIS — N058 Unspecified nephritic syndrome with other morphologic changes: Secondary | ICD-10-CM | POA: Diagnosis not present

## 2012-09-23 DIAGNOSIS — E1129 Type 2 diabetes mellitus with other diabetic kidney complication: Secondary | ICD-10-CM | POA: Diagnosis not present

## 2012-09-23 DIAGNOSIS — Z9981 Dependence on supplemental oxygen: Secondary | ICD-10-CM | POA: Diagnosis not present

## 2012-09-23 DIAGNOSIS — E785 Hyperlipidemia, unspecified: Secondary | ICD-10-CM | POA: Diagnosis not present

## 2012-09-23 DIAGNOSIS — E1149 Type 2 diabetes mellitus with other diabetic neurological complication: Secondary | ICD-10-CM | POA: Diagnosis not present

## 2012-09-23 DIAGNOSIS — E1142 Type 2 diabetes mellitus with diabetic polyneuropathy: Secondary | ICD-10-CM | POA: Diagnosis not present

## 2012-09-23 DIAGNOSIS — Z794 Long term (current) use of insulin: Secondary | ICD-10-CM | POA: Diagnosis not present

## 2012-09-23 DIAGNOSIS — J449 Chronic obstructive pulmonary disease, unspecified: Secondary | ICD-10-CM | POA: Diagnosis not present

## 2012-09-25 DIAGNOSIS — G589 Mononeuropathy, unspecified: Secondary | ICD-10-CM | POA: Diagnosis not present

## 2012-09-25 DIAGNOSIS — Z79899 Other long term (current) drug therapy: Secondary | ICD-10-CM | POA: Diagnosis not present

## 2012-10-06 DIAGNOSIS — E7800 Pure hypercholesterolemia, unspecified: Secondary | ICD-10-CM | POA: Diagnosis not present

## 2012-10-06 DIAGNOSIS — IMO0002 Reserved for concepts with insufficient information to code with codable children: Secondary | ICD-10-CM | POA: Diagnosis not present

## 2012-10-06 DIAGNOSIS — E119 Type 2 diabetes mellitus without complications: Secondary | ICD-10-CM | POA: Diagnosis not present

## 2012-10-06 DIAGNOSIS — I10 Essential (primary) hypertension: Secondary | ICD-10-CM | POA: Diagnosis not present

## 2012-10-06 DIAGNOSIS — M5412 Radiculopathy, cervical region: Secondary | ICD-10-CM | POA: Diagnosis not present

## 2012-10-06 DIAGNOSIS — Z981 Arthrodesis status: Secondary | ICD-10-CM | POA: Diagnosis not present

## 2012-10-06 DIAGNOSIS — I4891 Unspecified atrial fibrillation: Secondary | ICD-10-CM | POA: Diagnosis not present

## 2012-10-06 DIAGNOSIS — J449 Chronic obstructive pulmonary disease, unspecified: Secondary | ICD-10-CM | POA: Diagnosis not present

## 2012-10-06 DIAGNOSIS — I509 Heart failure, unspecified: Secondary | ICD-10-CM | POA: Diagnosis not present

## 2012-10-06 DIAGNOSIS — Z7902 Long term (current) use of antithrombotics/antiplatelets: Secondary | ICD-10-CM | POA: Diagnosis not present

## 2012-10-06 DIAGNOSIS — K219 Gastro-esophageal reflux disease without esophagitis: Secondary | ICD-10-CM | POA: Diagnosis not present

## 2012-10-06 DIAGNOSIS — M19019 Primary osteoarthritis, unspecified shoulder: Secondary | ICD-10-CM | POA: Diagnosis not present

## 2012-10-06 DIAGNOSIS — M542 Cervicalgia: Secondary | ICD-10-CM | POA: Diagnosis not present

## 2012-10-08 DIAGNOSIS — Z5181 Encounter for therapeutic drug level monitoring: Secondary | ICD-10-CM | POA: Diagnosis not present

## 2012-10-08 DIAGNOSIS — I4891 Unspecified atrial fibrillation: Secondary | ICD-10-CM | POA: Diagnosis not present

## 2012-10-08 DIAGNOSIS — Z7901 Long term (current) use of anticoagulants: Secondary | ICD-10-CM | POA: Diagnosis not present

## 2012-10-12 DIAGNOSIS — M5000 Cervical disc disorder with myelopathy, unspecified cervical region: Secondary | ICD-10-CM | POA: Diagnosis not present

## 2012-10-12 DIAGNOSIS — M546 Pain in thoracic spine: Secondary | ICD-10-CM | POA: Diagnosis not present

## 2012-10-13 DIAGNOSIS — M5030 Other cervical disc degeneration, unspecified cervical region: Secondary | ICD-10-CM | POA: Diagnosis not present

## 2012-10-13 DIAGNOSIS — IMO0002 Reserved for concepts with insufficient information to code with codable children: Secondary | ICD-10-CM | POA: Diagnosis not present

## 2012-10-13 DIAGNOSIS — Z981 Arthrodesis status: Secondary | ICD-10-CM | POA: Diagnosis not present

## 2012-10-13 DIAGNOSIS — M546 Pain in thoracic spine: Secondary | ICD-10-CM | POA: Diagnosis not present

## 2012-10-15 DIAGNOSIS — M961 Postlaminectomy syndrome, not elsewhere classified: Secondary | ICD-10-CM | POA: Diagnosis not present

## 2012-10-15 DIAGNOSIS — G5600 Carpal tunnel syndrome, unspecified upper limb: Secondary | ICD-10-CM | POA: Diagnosis not present

## 2012-10-15 DIAGNOSIS — IMO0001 Reserved for inherently not codable concepts without codable children: Secondary | ICD-10-CM | POA: Diagnosis not present

## 2012-10-15 DIAGNOSIS — M25539 Pain in unspecified wrist: Secondary | ICD-10-CM | POA: Diagnosis not present

## 2012-10-15 DIAGNOSIS — M5412 Radiculopathy, cervical region: Secondary | ICD-10-CM | POA: Diagnosis not present

## 2012-10-15 DIAGNOSIS — IMO0002 Reserved for concepts with insufficient information to code with codable children: Secondary | ICD-10-CM | POA: Diagnosis not present

## 2012-10-15 DIAGNOSIS — M5137 Other intervertebral disc degeneration, lumbosacral region: Secondary | ICD-10-CM | POA: Diagnosis not present

## 2012-10-15 DIAGNOSIS — G5610 Other lesions of median nerve, unspecified upper limb: Secondary | ICD-10-CM | POA: Diagnosis not present

## 2012-10-15 DIAGNOSIS — M25569 Pain in unspecified knee: Secondary | ICD-10-CM | POA: Diagnosis not present

## 2012-10-15 DIAGNOSIS — M47817 Spondylosis without myelopathy or radiculopathy, lumbosacral region: Secondary | ICD-10-CM | POA: Diagnosis not present

## 2012-10-22 DIAGNOSIS — M25569 Pain in unspecified knee: Secondary | ICD-10-CM | POA: Diagnosis not present

## 2012-10-22 DIAGNOSIS — M961 Postlaminectomy syndrome, not elsewhere classified: Secondary | ICD-10-CM | POA: Diagnosis not present

## 2012-10-22 DIAGNOSIS — IMO0002 Reserved for concepts with insufficient information to code with codable children: Secondary | ICD-10-CM | POA: Diagnosis not present

## 2012-10-22 DIAGNOSIS — G894 Chronic pain syndrome: Secondary | ICD-10-CM | POA: Diagnosis not present

## 2012-10-22 DIAGNOSIS — G5600 Carpal tunnel syndrome, unspecified upper limb: Secondary | ICD-10-CM | POA: Diagnosis not present

## 2012-10-22 DIAGNOSIS — M47817 Spondylosis without myelopathy or radiculopathy, lumbosacral region: Secondary | ICD-10-CM | POA: Diagnosis not present

## 2012-10-22 DIAGNOSIS — M5412 Radiculopathy, cervical region: Secondary | ICD-10-CM | POA: Diagnosis not present

## 2012-10-22 DIAGNOSIS — M5137 Other intervertebral disc degeneration, lumbosacral region: Secondary | ICD-10-CM | POA: Diagnosis not present

## 2012-10-25 DIAGNOSIS — J9610 Chronic respiratory failure, unspecified whether with hypoxia or hypercapnia: Secondary | ICD-10-CM | POA: Diagnosis not present

## 2012-10-25 DIAGNOSIS — D649 Anemia, unspecified: Secondary | ICD-10-CM | POA: Diagnosis present

## 2012-10-25 DIAGNOSIS — I509 Heart failure, unspecified: Secondary | ICD-10-CM | POA: Diagnosis not present

## 2012-10-25 DIAGNOSIS — J449 Chronic obstructive pulmonary disease, unspecified: Secondary | ICD-10-CM | POA: Diagnosis not present

## 2012-10-25 DIAGNOSIS — M542 Cervicalgia: Secondary | ICD-10-CM | POA: Diagnosis not present

## 2012-10-25 DIAGNOSIS — G589 Mononeuropathy, unspecified: Secondary | ICD-10-CM | POA: Diagnosis present

## 2012-10-25 DIAGNOSIS — N179 Acute kidney failure, unspecified: Secondary | ICD-10-CM | POA: Diagnosis not present

## 2012-10-25 DIAGNOSIS — Z6841 Body Mass Index (BMI) 40.0 and over, adult: Secondary | ICD-10-CM | POA: Diagnosis not present

## 2012-10-25 DIAGNOSIS — F411 Generalized anxiety disorder: Secondary | ICD-10-CM | POA: Diagnosis present

## 2012-10-25 DIAGNOSIS — G894 Chronic pain syndrome: Secondary | ICD-10-CM | POA: Diagnosis present

## 2012-10-25 DIAGNOSIS — N183 Chronic kidney disease, stage 3 unspecified: Secondary | ICD-10-CM | POA: Diagnosis present

## 2012-10-25 DIAGNOSIS — K219 Gastro-esophageal reflux disease without esophagitis: Secondary | ICD-10-CM | POA: Diagnosis present

## 2012-10-25 DIAGNOSIS — M810 Age-related osteoporosis without current pathological fracture: Secondary | ICD-10-CM | POA: Diagnosis present

## 2012-10-25 DIAGNOSIS — J4489 Other specified chronic obstructive pulmonary disease: Secondary | ICD-10-CM | POA: Diagnosis not present

## 2012-10-25 DIAGNOSIS — R579 Shock, unspecified: Secondary | ICD-10-CM | POA: Diagnosis not present

## 2012-10-25 DIAGNOSIS — Z9981 Dependence on supplemental oxygen: Secondary | ICD-10-CM | POA: Diagnosis not present

## 2012-10-25 DIAGNOSIS — E860 Dehydration: Secondary | ICD-10-CM | POA: Diagnosis present

## 2012-10-25 DIAGNOSIS — K5730 Diverticulosis of large intestine without perforation or abscess without bleeding: Secondary | ICD-10-CM | POA: Diagnosis present

## 2012-10-25 DIAGNOSIS — G4733 Obstructive sleep apnea (adult) (pediatric): Secondary | ICD-10-CM | POA: Diagnosis present

## 2012-10-25 DIAGNOSIS — R55 Syncope and collapse: Secondary | ICD-10-CM | POA: Diagnosis not present

## 2012-10-25 DIAGNOSIS — M5030 Other cervical disc degeneration, unspecified cervical region: Secondary | ICD-10-CM | POA: Diagnosis not present

## 2012-10-25 DIAGNOSIS — Z87891 Personal history of nicotine dependence: Secondary | ICD-10-CM | POA: Diagnosis not present

## 2012-10-25 DIAGNOSIS — E119 Type 2 diabetes mellitus without complications: Secondary | ICD-10-CM | POA: Diagnosis present

## 2012-10-25 DIAGNOSIS — I959 Hypotension, unspecified: Secondary | ICD-10-CM | POA: Diagnosis not present

## 2012-10-25 DIAGNOSIS — F319 Bipolar disorder, unspecified: Secondary | ICD-10-CM | POA: Diagnosis present

## 2012-10-25 DIAGNOSIS — I517 Cardiomegaly: Secondary | ICD-10-CM | POA: Diagnosis not present

## 2012-10-25 DIAGNOSIS — I498 Other specified cardiac arrhythmias: Secondary | ICD-10-CM | POA: Diagnosis not present

## 2012-10-25 DIAGNOSIS — I129 Hypertensive chronic kidney disease with stage 1 through stage 4 chronic kidney disease, or unspecified chronic kidney disease: Secondary | ICD-10-CM | POA: Diagnosis not present

## 2012-10-25 DIAGNOSIS — I4891 Unspecified atrial fibrillation: Secondary | ICD-10-CM | POA: Diagnosis not present

## 2012-10-25 DIAGNOSIS — M7989 Other specified soft tissue disorders: Secondary | ICD-10-CM | POA: Diagnosis not present

## 2012-10-25 DIAGNOSIS — E875 Hyperkalemia: Secondary | ICD-10-CM | POA: Diagnosis not present

## 2012-10-29 DIAGNOSIS — I951 Orthostatic hypotension: Secondary | ICD-10-CM | POA: Diagnosis not present

## 2012-10-29 DIAGNOSIS — J441 Chronic obstructive pulmonary disease with (acute) exacerbation: Secondary | ICD-10-CM | POA: Diagnosis not present

## 2012-10-29 DIAGNOSIS — E119 Type 2 diabetes mellitus without complications: Secondary | ICD-10-CM | POA: Diagnosis not present

## 2012-10-29 DIAGNOSIS — IMO0001 Reserved for inherently not codable concepts without codable children: Secondary | ICD-10-CM | POA: Diagnosis not present

## 2012-10-29 DIAGNOSIS — I509 Heart failure, unspecified: Secondary | ICD-10-CM | POA: Diagnosis not present

## 2012-10-29 DIAGNOSIS — F319 Bipolar disorder, unspecified: Secondary | ICD-10-CM | POA: Diagnosis not present

## 2012-11-02 DIAGNOSIS — I951 Orthostatic hypotension: Secondary | ICD-10-CM | POA: Diagnosis not present

## 2012-11-02 DIAGNOSIS — IMO0001 Reserved for inherently not codable concepts without codable children: Secondary | ICD-10-CM | POA: Diagnosis not present

## 2012-11-02 DIAGNOSIS — J441 Chronic obstructive pulmonary disease with (acute) exacerbation: Secondary | ICD-10-CM | POA: Diagnosis not present

## 2012-11-02 DIAGNOSIS — E119 Type 2 diabetes mellitus without complications: Secondary | ICD-10-CM | POA: Diagnosis not present

## 2012-11-02 DIAGNOSIS — I509 Heart failure, unspecified: Secondary | ICD-10-CM | POA: Diagnosis not present

## 2012-11-02 DIAGNOSIS — F319 Bipolar disorder, unspecified: Secondary | ICD-10-CM | POA: Diagnosis not present

## 2012-11-03 DIAGNOSIS — Z7901 Long term (current) use of anticoagulants: Secondary | ICD-10-CM | POA: Diagnosis not present

## 2012-11-03 DIAGNOSIS — Z5181 Encounter for therapeutic drug level monitoring: Secondary | ICD-10-CM | POA: Diagnosis not present

## 2012-11-03 DIAGNOSIS — I4891 Unspecified atrial fibrillation: Secondary | ICD-10-CM | POA: Diagnosis not present

## 2012-11-05 DIAGNOSIS — F319 Bipolar disorder, unspecified: Secondary | ICD-10-CM | POA: Diagnosis not present

## 2012-11-05 DIAGNOSIS — I509 Heart failure, unspecified: Secondary | ICD-10-CM | POA: Diagnosis not present

## 2012-11-05 DIAGNOSIS — I951 Orthostatic hypotension: Secondary | ICD-10-CM | POA: Diagnosis not present

## 2012-11-05 DIAGNOSIS — E119 Type 2 diabetes mellitus without complications: Secondary | ICD-10-CM | POA: Diagnosis not present

## 2012-11-05 DIAGNOSIS — J441 Chronic obstructive pulmonary disease with (acute) exacerbation: Secondary | ICD-10-CM | POA: Diagnosis not present

## 2012-11-05 DIAGNOSIS — IMO0001 Reserved for inherently not codable concepts without codable children: Secondary | ICD-10-CM | POA: Diagnosis not present

## 2012-11-06 DIAGNOSIS — I951 Orthostatic hypotension: Secondary | ICD-10-CM | POA: Diagnosis not present

## 2012-11-06 DIAGNOSIS — J441 Chronic obstructive pulmonary disease with (acute) exacerbation: Secondary | ICD-10-CM | POA: Diagnosis not present

## 2012-11-06 DIAGNOSIS — IMO0001 Reserved for inherently not codable concepts without codable children: Secondary | ICD-10-CM | POA: Diagnosis not present

## 2012-11-06 DIAGNOSIS — E119 Type 2 diabetes mellitus without complications: Secondary | ICD-10-CM | POA: Diagnosis not present

## 2012-11-06 DIAGNOSIS — I509 Heart failure, unspecified: Secondary | ICD-10-CM | POA: Diagnosis not present

## 2012-11-06 DIAGNOSIS — F319 Bipolar disorder, unspecified: Secondary | ICD-10-CM | POA: Diagnosis not present

## 2012-11-09 DIAGNOSIS — IMO0001 Reserved for inherently not codable concepts without codable children: Secondary | ICD-10-CM | POA: Diagnosis not present

## 2012-11-09 DIAGNOSIS — I509 Heart failure, unspecified: Secondary | ICD-10-CM | POA: Diagnosis not present

## 2012-11-09 DIAGNOSIS — J441 Chronic obstructive pulmonary disease with (acute) exacerbation: Secondary | ICD-10-CM | POA: Diagnosis not present

## 2012-11-09 DIAGNOSIS — F319 Bipolar disorder, unspecified: Secondary | ICD-10-CM | POA: Diagnosis not present

## 2012-11-09 DIAGNOSIS — I951 Orthostatic hypotension: Secondary | ICD-10-CM | POA: Diagnosis not present

## 2012-11-09 DIAGNOSIS — E119 Type 2 diabetes mellitus without complications: Secondary | ICD-10-CM | POA: Diagnosis not present

## 2012-11-10 DIAGNOSIS — R55 Syncope and collapse: Secondary | ICD-10-CM | POA: Diagnosis not present

## 2012-11-10 DIAGNOSIS — Z09 Encounter for follow-up examination after completed treatment for conditions other than malignant neoplasm: Secondary | ICD-10-CM | POA: Diagnosis not present

## 2012-11-10 DIAGNOSIS — I9589 Other hypotension: Secondary | ICD-10-CM | POA: Diagnosis not present

## 2012-11-11 DIAGNOSIS — I509 Heart failure, unspecified: Secondary | ICD-10-CM | POA: Diagnosis not present

## 2012-11-11 DIAGNOSIS — Z7901 Long term (current) use of anticoagulants: Secondary | ICD-10-CM | POA: Diagnosis not present

## 2012-11-11 DIAGNOSIS — Z5181 Encounter for therapeutic drug level monitoring: Secondary | ICD-10-CM | POA: Diagnosis not present

## 2012-11-11 DIAGNOSIS — IMO0001 Reserved for inherently not codable concepts without codable children: Secondary | ICD-10-CM | POA: Diagnosis not present

## 2012-11-11 DIAGNOSIS — I951 Orthostatic hypotension: Secondary | ICD-10-CM | POA: Diagnosis not present

## 2012-11-11 DIAGNOSIS — E119 Type 2 diabetes mellitus without complications: Secondary | ICD-10-CM | POA: Diagnosis not present

## 2012-11-11 DIAGNOSIS — I4891 Unspecified atrial fibrillation: Secondary | ICD-10-CM | POA: Diagnosis not present

## 2012-11-11 DIAGNOSIS — J441 Chronic obstructive pulmonary disease with (acute) exacerbation: Secondary | ICD-10-CM | POA: Diagnosis not present

## 2012-11-11 DIAGNOSIS — F319 Bipolar disorder, unspecified: Secondary | ICD-10-CM | POA: Diagnosis not present

## 2012-11-12 DIAGNOSIS — M5020 Other cervical disc displacement, unspecified cervical region: Secondary | ICD-10-CM | POA: Diagnosis not present

## 2012-11-12 DIAGNOSIS — IMO0002 Reserved for concepts with insufficient information to code with codable children: Secondary | ICD-10-CM | POA: Diagnosis not present

## 2012-11-12 DIAGNOSIS — M25569 Pain in unspecified knee: Secondary | ICD-10-CM | POA: Diagnosis not present

## 2012-11-12 DIAGNOSIS — M5030 Other cervical disc degeneration, unspecified cervical region: Secondary | ICD-10-CM | POA: Diagnosis not present

## 2012-11-12 DIAGNOSIS — M5412 Radiculopathy, cervical region: Secondary | ICD-10-CM | POA: Diagnosis not present

## 2012-11-12 DIAGNOSIS — M5137 Other intervertebral disc degeneration, lumbosacral region: Secondary | ICD-10-CM | POA: Diagnosis not present

## 2012-11-12 DIAGNOSIS — M47817 Spondylosis without myelopathy or radiculopathy, lumbosacral region: Secondary | ICD-10-CM | POA: Diagnosis not present

## 2012-11-12 DIAGNOSIS — G894 Chronic pain syndrome: Secondary | ICD-10-CM | POA: Diagnosis not present

## 2012-11-12 DIAGNOSIS — M961 Postlaminectomy syndrome, not elsewhere classified: Secondary | ICD-10-CM | POA: Diagnosis not present

## 2012-11-12 DIAGNOSIS — G5600 Carpal tunnel syndrome, unspecified upper limb: Secondary | ICD-10-CM | POA: Diagnosis not present

## 2012-11-13 DIAGNOSIS — F319 Bipolar disorder, unspecified: Secondary | ICD-10-CM | POA: Diagnosis not present

## 2012-11-13 DIAGNOSIS — IMO0001 Reserved for inherently not codable concepts without codable children: Secondary | ICD-10-CM | POA: Diagnosis not present

## 2012-11-13 DIAGNOSIS — E119 Type 2 diabetes mellitus without complications: Secondary | ICD-10-CM | POA: Diagnosis not present

## 2012-11-13 DIAGNOSIS — I509 Heart failure, unspecified: Secondary | ICD-10-CM | POA: Diagnosis not present

## 2012-11-13 DIAGNOSIS — I951 Orthostatic hypotension: Secondary | ICD-10-CM | POA: Diagnosis not present

## 2012-11-13 DIAGNOSIS — J441 Chronic obstructive pulmonary disease with (acute) exacerbation: Secondary | ICD-10-CM | POA: Diagnosis not present

## 2012-11-16 DIAGNOSIS — J441 Chronic obstructive pulmonary disease with (acute) exacerbation: Secondary | ICD-10-CM | POA: Diagnosis not present

## 2012-11-16 DIAGNOSIS — E119 Type 2 diabetes mellitus without complications: Secondary | ICD-10-CM | POA: Diagnosis not present

## 2012-11-16 DIAGNOSIS — IMO0001 Reserved for inherently not codable concepts without codable children: Secondary | ICD-10-CM | POA: Diagnosis not present

## 2012-11-16 DIAGNOSIS — F319 Bipolar disorder, unspecified: Secondary | ICD-10-CM | POA: Diagnosis not present

## 2012-11-16 DIAGNOSIS — I951 Orthostatic hypotension: Secondary | ICD-10-CM | POA: Diagnosis not present

## 2012-11-16 DIAGNOSIS — I509 Heart failure, unspecified: Secondary | ICD-10-CM | POA: Diagnosis not present

## 2012-11-18 DIAGNOSIS — F319 Bipolar disorder, unspecified: Secondary | ICD-10-CM | POA: Diagnosis not present

## 2012-11-18 DIAGNOSIS — J441 Chronic obstructive pulmonary disease with (acute) exacerbation: Secondary | ICD-10-CM | POA: Diagnosis not present

## 2012-11-18 DIAGNOSIS — IMO0001 Reserved for inherently not codable concepts without codable children: Secondary | ICD-10-CM | POA: Diagnosis not present

## 2012-11-18 DIAGNOSIS — I951 Orthostatic hypotension: Secondary | ICD-10-CM | POA: Diagnosis not present

## 2012-11-18 DIAGNOSIS — E119 Type 2 diabetes mellitus without complications: Secondary | ICD-10-CM | POA: Diagnosis not present

## 2012-11-18 DIAGNOSIS — I509 Heart failure, unspecified: Secondary | ICD-10-CM | POA: Diagnosis not present

## 2012-11-20 DIAGNOSIS — E119 Type 2 diabetes mellitus without complications: Secondary | ICD-10-CM | POA: Diagnosis not present

## 2012-11-20 DIAGNOSIS — F319 Bipolar disorder, unspecified: Secondary | ICD-10-CM | POA: Diagnosis not present

## 2012-11-20 DIAGNOSIS — I951 Orthostatic hypotension: Secondary | ICD-10-CM | POA: Diagnosis not present

## 2012-11-20 DIAGNOSIS — J441 Chronic obstructive pulmonary disease with (acute) exacerbation: Secondary | ICD-10-CM | POA: Diagnosis not present

## 2012-11-20 DIAGNOSIS — I509 Heart failure, unspecified: Secondary | ICD-10-CM | POA: Diagnosis not present

## 2012-11-20 DIAGNOSIS — IMO0001 Reserved for inherently not codable concepts without codable children: Secondary | ICD-10-CM | POA: Diagnosis not present

## 2012-11-23 DIAGNOSIS — I509 Heart failure, unspecified: Secondary | ICD-10-CM | POA: Diagnosis not present

## 2012-11-23 DIAGNOSIS — I951 Orthostatic hypotension: Secondary | ICD-10-CM | POA: Diagnosis not present

## 2012-11-23 DIAGNOSIS — F319 Bipolar disorder, unspecified: Secondary | ICD-10-CM | POA: Diagnosis not present

## 2012-11-23 DIAGNOSIS — E119 Type 2 diabetes mellitus without complications: Secondary | ICD-10-CM | POA: Diagnosis not present

## 2012-11-23 DIAGNOSIS — J441 Chronic obstructive pulmonary disease with (acute) exacerbation: Secondary | ICD-10-CM | POA: Diagnosis not present

## 2012-11-23 DIAGNOSIS — IMO0001 Reserved for inherently not codable concepts without codable children: Secondary | ICD-10-CM | POA: Diagnosis not present

## 2012-11-24 DIAGNOSIS — IMO0001 Reserved for inherently not codable concepts without codable children: Secondary | ICD-10-CM | POA: Diagnosis not present

## 2012-11-24 DIAGNOSIS — E119 Type 2 diabetes mellitus without complications: Secondary | ICD-10-CM | POA: Diagnosis not present

## 2012-11-24 DIAGNOSIS — I951 Orthostatic hypotension: Secondary | ICD-10-CM | POA: Diagnosis not present

## 2012-11-24 DIAGNOSIS — I509 Heart failure, unspecified: Secondary | ICD-10-CM | POA: Diagnosis not present

## 2012-11-24 DIAGNOSIS — J441 Chronic obstructive pulmonary disease with (acute) exacerbation: Secondary | ICD-10-CM | POA: Diagnosis not present

## 2012-11-24 DIAGNOSIS — F319 Bipolar disorder, unspecified: Secondary | ICD-10-CM | POA: Diagnosis not present

## 2012-11-25 DIAGNOSIS — E1149 Type 2 diabetes mellitus with other diabetic neurological complication: Secondary | ICD-10-CM | POA: Diagnosis not present

## 2012-11-26 DIAGNOSIS — I509 Heart failure, unspecified: Secondary | ICD-10-CM | POA: Diagnosis not present

## 2012-11-26 DIAGNOSIS — F319 Bipolar disorder, unspecified: Secondary | ICD-10-CM | POA: Diagnosis not present

## 2012-11-26 DIAGNOSIS — I951 Orthostatic hypotension: Secondary | ICD-10-CM | POA: Diagnosis not present

## 2012-11-26 DIAGNOSIS — IMO0001 Reserved for inherently not codable concepts without codable children: Secondary | ICD-10-CM | POA: Diagnosis not present

## 2012-11-26 DIAGNOSIS — E119 Type 2 diabetes mellitus without complications: Secondary | ICD-10-CM | POA: Diagnosis not present

## 2012-11-26 DIAGNOSIS — J441 Chronic obstructive pulmonary disease with (acute) exacerbation: Secondary | ICD-10-CM | POA: Diagnosis not present

## 2012-11-27 DIAGNOSIS — E119 Type 2 diabetes mellitus without complications: Secondary | ICD-10-CM | POA: Diagnosis not present

## 2012-11-27 DIAGNOSIS — R55 Syncope and collapse: Secondary | ICD-10-CM | POA: Diagnosis not present

## 2012-11-27 DIAGNOSIS — Z09 Encounter for follow-up examination after completed treatment for conditions other than malignant neoplasm: Secondary | ICD-10-CM | POA: Diagnosis not present

## 2012-11-27 DIAGNOSIS — I9589 Other hypotension: Secondary | ICD-10-CM | POA: Diagnosis not present

## 2012-11-27 DIAGNOSIS — IMO0001 Reserved for inherently not codable concepts without codable children: Secondary | ICD-10-CM | POA: Diagnosis not present

## 2012-11-27 DIAGNOSIS — F319 Bipolar disorder, unspecified: Secondary | ICD-10-CM | POA: Diagnosis not present

## 2012-11-27 DIAGNOSIS — J441 Chronic obstructive pulmonary disease with (acute) exacerbation: Secondary | ICD-10-CM | POA: Diagnosis not present

## 2012-11-27 DIAGNOSIS — I509 Heart failure, unspecified: Secondary | ICD-10-CM | POA: Diagnosis not present

## 2012-11-27 DIAGNOSIS — I951 Orthostatic hypotension: Secondary | ICD-10-CM | POA: Diagnosis not present

## 2012-11-30 DIAGNOSIS — I4891 Unspecified atrial fibrillation: Secondary | ICD-10-CM | POA: Diagnosis not present

## 2012-11-30 DIAGNOSIS — I519 Heart disease, unspecified: Secondary | ICD-10-CM | POA: Diagnosis not present

## 2012-11-30 DIAGNOSIS — R9431 Abnormal electrocardiogram [ECG] [EKG]: Secondary | ICD-10-CM | POA: Diagnosis not present

## 2012-11-30 DIAGNOSIS — E119 Type 2 diabetes mellitus without complications: Secondary | ICD-10-CM | POA: Diagnosis not present

## 2012-11-30 DIAGNOSIS — E785 Hyperlipidemia, unspecified: Secondary | ICD-10-CM | POA: Diagnosis not present

## 2012-12-03 DIAGNOSIS — E119 Type 2 diabetes mellitus without complications: Secondary | ICD-10-CM | POA: Diagnosis not present

## 2012-12-03 DIAGNOSIS — IMO0001 Reserved for inherently not codable concepts without codable children: Secondary | ICD-10-CM | POA: Diagnosis not present

## 2012-12-03 DIAGNOSIS — F319 Bipolar disorder, unspecified: Secondary | ICD-10-CM | POA: Diagnosis not present

## 2012-12-03 DIAGNOSIS — I951 Orthostatic hypotension: Secondary | ICD-10-CM | POA: Diagnosis not present

## 2012-12-03 DIAGNOSIS — I509 Heart failure, unspecified: Secondary | ICD-10-CM | POA: Diagnosis not present

## 2012-12-03 DIAGNOSIS — J441 Chronic obstructive pulmonary disease with (acute) exacerbation: Secondary | ICD-10-CM | POA: Diagnosis not present

## 2012-12-07 DIAGNOSIS — M5380 Other specified dorsopathies, site unspecified: Secondary | ICD-10-CM | POA: Diagnosis not present

## 2012-12-07 DIAGNOSIS — M5000 Cervical disc disorder with myelopathy, unspecified cervical region: Secondary | ICD-10-CM | POA: Diagnosis not present

## 2012-12-07 DIAGNOSIS — M5030 Other cervical disc degeneration, unspecified cervical region: Secondary | ICD-10-CM | POA: Diagnosis not present

## 2012-12-07 DIAGNOSIS — M542 Cervicalgia: Secondary | ICD-10-CM | POA: Diagnosis not present

## 2012-12-07 DIAGNOSIS — Z981 Arthrodesis status: Secondary | ICD-10-CM | POA: Diagnosis not present

## 2012-12-09 DIAGNOSIS — E878 Other disorders of electrolyte and fluid balance, not elsewhere classified: Secondary | ICD-10-CM | POA: Diagnosis not present

## 2012-12-10 DIAGNOSIS — Z006 Encounter for examination for normal comparison and control in clinical research program: Secondary | ICD-10-CM | POA: Diagnosis not present

## 2012-12-10 DIAGNOSIS — G5600 Carpal tunnel syndrome, unspecified upper limb: Secondary | ICD-10-CM | POA: Diagnosis not present

## 2012-12-10 DIAGNOSIS — IMO0002 Reserved for concepts with insufficient information to code with codable children: Secondary | ICD-10-CM | POA: Diagnosis not present

## 2012-12-10 DIAGNOSIS — M961 Postlaminectomy syndrome, not elsewhere classified: Secondary | ICD-10-CM | POA: Diagnosis not present

## 2012-12-10 DIAGNOSIS — G894 Chronic pain syndrome: Secondary | ICD-10-CM | POA: Diagnosis not present

## 2012-12-10 DIAGNOSIS — M5137 Other intervertebral disc degeneration, lumbosacral region: Secondary | ICD-10-CM | POA: Diagnosis not present

## 2012-12-10 DIAGNOSIS — M159 Polyosteoarthritis, unspecified: Secondary | ICD-10-CM | POA: Diagnosis not present

## 2012-12-10 DIAGNOSIS — M51379 Other intervertebral disc degeneration, lumbosacral region without mention of lumbar back pain or lower extremity pain: Secondary | ICD-10-CM | POA: Diagnosis not present

## 2012-12-10 DIAGNOSIS — M25569 Pain in unspecified knee: Secondary | ICD-10-CM | POA: Diagnosis not present

## 2012-12-10 DIAGNOSIS — M5412 Radiculopathy, cervical region: Secondary | ICD-10-CM | POA: Diagnosis not present

## 2012-12-11 DIAGNOSIS — J441 Chronic obstructive pulmonary disease with (acute) exacerbation: Secondary | ICD-10-CM | POA: Diagnosis not present

## 2012-12-11 DIAGNOSIS — I951 Orthostatic hypotension: Secondary | ICD-10-CM | POA: Diagnosis not present

## 2012-12-11 DIAGNOSIS — I509 Heart failure, unspecified: Secondary | ICD-10-CM | POA: Diagnosis not present

## 2012-12-11 DIAGNOSIS — IMO0001 Reserved for inherently not codable concepts without codable children: Secondary | ICD-10-CM | POA: Diagnosis not present

## 2012-12-11 DIAGNOSIS — F319 Bipolar disorder, unspecified: Secondary | ICD-10-CM | POA: Diagnosis not present

## 2012-12-11 DIAGNOSIS — E119 Type 2 diabetes mellitus without complications: Secondary | ICD-10-CM | POA: Diagnosis not present

## 2012-12-14 DIAGNOSIS — M5000 Cervical disc disorder with myelopathy, unspecified cervical region: Secondary | ICD-10-CM | POA: Diagnosis not present

## 2012-12-14 DIAGNOSIS — R609 Edema, unspecified: Secondary | ICD-10-CM | POA: Diagnosis not present

## 2012-12-16 DIAGNOSIS — M5412 Radiculopathy, cervical region: Secondary | ICD-10-CM | POA: Diagnosis not present

## 2012-12-17 DIAGNOSIS — E119 Type 2 diabetes mellitus without complications: Secondary | ICD-10-CM | POA: Diagnosis not present

## 2012-12-17 DIAGNOSIS — I509 Heart failure, unspecified: Secondary | ICD-10-CM | POA: Diagnosis not present

## 2012-12-17 DIAGNOSIS — F319 Bipolar disorder, unspecified: Secondary | ICD-10-CM | POA: Diagnosis not present

## 2012-12-17 DIAGNOSIS — IMO0001 Reserved for inherently not codable concepts without codable children: Secondary | ICD-10-CM | POA: Diagnosis not present

## 2012-12-17 DIAGNOSIS — J441 Chronic obstructive pulmonary disease with (acute) exacerbation: Secondary | ICD-10-CM | POA: Diagnosis not present

## 2012-12-17 DIAGNOSIS — I951 Orthostatic hypotension: Secondary | ICD-10-CM | POA: Diagnosis not present

## 2012-12-22 DIAGNOSIS — R350 Frequency of micturition: Secondary | ICD-10-CM | POA: Diagnosis not present

## 2012-12-22 DIAGNOSIS — I4891 Unspecified atrial fibrillation: Secondary | ICD-10-CM | POA: Diagnosis not present

## 2012-12-22 DIAGNOSIS — Z5181 Encounter for therapeutic drug level monitoring: Secondary | ICD-10-CM | POA: Diagnosis not present

## 2012-12-22 DIAGNOSIS — Z7901 Long term (current) use of anticoagulants: Secondary | ICD-10-CM | POA: Diagnosis not present

## 2012-12-30 DIAGNOSIS — F4310 Post-traumatic stress disorder, unspecified: Secondary | ICD-10-CM | POA: Diagnosis not present

## 2012-12-30 DIAGNOSIS — F331 Major depressive disorder, recurrent, moderate: Secondary | ICD-10-CM | POA: Diagnosis not present

## 2012-12-30 DIAGNOSIS — I4892 Unspecified atrial flutter: Secondary | ICD-10-CM | POA: Diagnosis not present

## 2012-12-30 DIAGNOSIS — I509 Heart failure, unspecified: Secondary | ICD-10-CM | POA: Diagnosis not present

## 2012-12-30 DIAGNOSIS — I4891 Unspecified atrial fibrillation: Secondary | ICD-10-CM | POA: Diagnosis not present

## 2013-01-05 DIAGNOSIS — E662 Morbid (severe) obesity with alveolar hypoventilation: Secondary | ICD-10-CM | POA: Diagnosis not present

## 2013-01-05 DIAGNOSIS — J9610 Chronic respiratory failure, unspecified whether with hypoxia or hypercapnia: Secondary | ICD-10-CM | POA: Diagnosis not present

## 2013-01-05 DIAGNOSIS — G4713 Recurrent hypersomnia: Secondary | ICD-10-CM | POA: Diagnosis not present

## 2013-01-05 DIAGNOSIS — G4733 Obstructive sleep apnea (adult) (pediatric): Secondary | ICD-10-CM | POA: Diagnosis not present

## 2013-01-07 DIAGNOSIS — G5600 Carpal tunnel syndrome, unspecified upper limb: Secondary | ICD-10-CM | POA: Diagnosis not present

## 2013-01-07 DIAGNOSIS — M961 Postlaminectomy syndrome, not elsewhere classified: Secondary | ICD-10-CM | POA: Diagnosis not present

## 2013-01-07 DIAGNOSIS — M47817 Spondylosis without myelopathy or radiculopathy, lumbosacral region: Secondary | ICD-10-CM | POA: Diagnosis not present

## 2013-01-07 DIAGNOSIS — IMO0002 Reserved for concepts with insufficient information to code with codable children: Secondary | ICD-10-CM | POA: Diagnosis not present

## 2013-01-07 DIAGNOSIS — G894 Chronic pain syndrome: Secondary | ICD-10-CM | POA: Diagnosis not present

## 2013-01-07 DIAGNOSIS — M5412 Radiculopathy, cervical region: Secondary | ICD-10-CM | POA: Diagnosis not present

## 2013-01-07 DIAGNOSIS — M25569 Pain in unspecified knee: Secondary | ICD-10-CM | POA: Diagnosis not present

## 2013-01-07 DIAGNOSIS — M5137 Other intervertebral disc degeneration, lumbosacral region: Secondary | ICD-10-CM | POA: Diagnosis not present

## 2013-01-15 DIAGNOSIS — Z5181 Encounter for therapeutic drug level monitoring: Secondary | ICD-10-CM | POA: Diagnosis not present

## 2013-01-15 DIAGNOSIS — Z7901 Long term (current) use of anticoagulants: Secondary | ICD-10-CM | POA: Diagnosis not present

## 2013-01-15 DIAGNOSIS — I4891 Unspecified atrial fibrillation: Secondary | ICD-10-CM | POA: Diagnosis not present

## 2013-01-19 DIAGNOSIS — E1149 Type 2 diabetes mellitus with other diabetic neurological complication: Secondary | ICD-10-CM | POA: Diagnosis not present

## 2013-01-19 DIAGNOSIS — E7800 Pure hypercholesterolemia, unspecified: Secondary | ICD-10-CM | POA: Diagnosis not present

## 2013-01-25 DIAGNOSIS — E1149 Type 2 diabetes mellitus with other diabetic neurological complication: Secondary | ICD-10-CM | POA: Diagnosis not present

## 2013-01-25 DIAGNOSIS — Z794 Long term (current) use of insulin: Secondary | ICD-10-CM | POA: Diagnosis not present

## 2013-01-25 DIAGNOSIS — J449 Chronic obstructive pulmonary disease, unspecified: Secondary | ICD-10-CM | POA: Diagnosis not present

## 2013-01-25 DIAGNOSIS — E1129 Type 2 diabetes mellitus with other diabetic kidney complication: Secondary | ICD-10-CM | POA: Diagnosis not present

## 2013-01-25 DIAGNOSIS — E785 Hyperlipidemia, unspecified: Secondary | ICD-10-CM | POA: Diagnosis not present

## 2013-01-25 DIAGNOSIS — Z6841 Body Mass Index (BMI) 40.0 and over, adult: Secondary | ICD-10-CM | POA: Diagnosis not present

## 2013-01-25 DIAGNOSIS — Z7982 Long term (current) use of aspirin: Secondary | ICD-10-CM | POA: Diagnosis not present

## 2013-01-25 DIAGNOSIS — E1142 Type 2 diabetes mellitus with diabetic polyneuropathy: Secondary | ICD-10-CM | POA: Diagnosis not present

## 2013-01-25 DIAGNOSIS — N058 Unspecified nephritic syndrome with other morphologic changes: Secondary | ICD-10-CM | POA: Diagnosis not present

## 2013-01-28 DIAGNOSIS — M25519 Pain in unspecified shoulder: Secondary | ICD-10-CM | POA: Diagnosis not present

## 2013-01-28 DIAGNOSIS — R42 Dizziness and giddiness: Secondary | ICD-10-CM | POA: Diagnosis not present

## 2013-01-28 DIAGNOSIS — M19019 Primary osteoarthritis, unspecified shoulder: Secondary | ICD-10-CM | POA: Diagnosis not present

## 2013-01-28 DIAGNOSIS — R059 Cough, unspecified: Secondary | ICD-10-CM | POA: Diagnosis not present

## 2013-01-28 DIAGNOSIS — R609 Edema, unspecified: Secondary | ICD-10-CM | POA: Diagnosis not present

## 2013-01-28 DIAGNOSIS — R05 Cough: Secondary | ICD-10-CM | POA: Diagnosis not present

## 2013-02-04 DIAGNOSIS — M961 Postlaminectomy syndrome, not elsewhere classified: Secondary | ICD-10-CM | POA: Diagnosis not present

## 2013-02-04 DIAGNOSIS — M25569 Pain in unspecified knee: Secondary | ICD-10-CM | POA: Diagnosis not present

## 2013-02-04 DIAGNOSIS — M1610 Unilateral primary osteoarthritis, unspecified hip: Secondary | ICD-10-CM | POA: Diagnosis not present

## 2013-02-04 DIAGNOSIS — M5412 Radiculopathy, cervical region: Secondary | ICD-10-CM | POA: Diagnosis not present

## 2013-02-04 DIAGNOSIS — M1710 Unilateral primary osteoarthritis, unspecified knee: Secondary | ICD-10-CM | POA: Diagnosis not present

## 2013-02-04 DIAGNOSIS — G5600 Carpal tunnel syndrome, unspecified upper limb: Secondary | ICD-10-CM | POA: Diagnosis not present

## 2013-02-04 DIAGNOSIS — M5137 Other intervertebral disc degeneration, lumbosacral region: Secondary | ICD-10-CM | POA: Diagnosis not present

## 2013-02-04 DIAGNOSIS — M159 Polyosteoarthritis, unspecified: Secondary | ICD-10-CM | POA: Diagnosis not present

## 2013-02-04 DIAGNOSIS — IMO0002 Reserved for concepts with insufficient information to code with codable children: Secondary | ICD-10-CM | POA: Diagnosis not present

## 2013-02-05 DIAGNOSIS — Z5181 Encounter for therapeutic drug level monitoring: Secondary | ICD-10-CM | POA: Diagnosis not present

## 2013-02-05 DIAGNOSIS — I4891 Unspecified atrial fibrillation: Secondary | ICD-10-CM | POA: Diagnosis not present

## 2013-02-05 DIAGNOSIS — Z7901 Long term (current) use of anticoagulants: Secondary | ICD-10-CM | POA: Diagnosis not present

## 2013-02-13 DIAGNOSIS — T07XXXA Unspecified multiple injuries, initial encounter: Secondary | ICD-10-CM | POA: Diagnosis not present

## 2013-02-16 DIAGNOSIS — I4891 Unspecified atrial fibrillation: Secondary | ICD-10-CM | POA: Diagnosis not present

## 2013-02-16 DIAGNOSIS — Z7901 Long term (current) use of anticoagulants: Secondary | ICD-10-CM | POA: Diagnosis not present

## 2013-02-16 DIAGNOSIS — Z5181 Encounter for therapeutic drug level monitoring: Secondary | ICD-10-CM | POA: Diagnosis not present

## 2013-02-17 DIAGNOSIS — IMO0002 Reserved for concepts with insufficient information to code with codable children: Secondary | ICD-10-CM | POA: Diagnosis not present

## 2013-02-24 DIAGNOSIS — S93529A Sprain of metatarsophalangeal joint of unspecified toe(s), initial encounter: Secondary | ICD-10-CM | POA: Diagnosis not present

## 2013-02-24 DIAGNOSIS — B351 Tinea unguium: Secondary | ICD-10-CM | POA: Diagnosis not present

## 2013-02-24 DIAGNOSIS — E1149 Type 2 diabetes mellitus with other diabetic neurological complication: Secondary | ICD-10-CM | POA: Diagnosis not present

## 2013-02-26 DIAGNOSIS — E7800 Pure hypercholesterolemia, unspecified: Secondary | ICD-10-CM | POA: Diagnosis not present

## 2013-02-26 DIAGNOSIS — Z9981 Dependence on supplemental oxygen: Secondary | ICD-10-CM | POA: Diagnosis not present

## 2013-02-26 DIAGNOSIS — J441 Chronic obstructive pulmonary disease with (acute) exacerbation: Secondary | ICD-10-CM | POA: Diagnosis not present

## 2013-02-26 DIAGNOSIS — Z981 Arthrodesis status: Secondary | ICD-10-CM | POA: Diagnosis not present

## 2013-02-26 DIAGNOSIS — I10 Essential (primary) hypertension: Secondary | ICD-10-CM | POA: Diagnosis not present

## 2013-02-26 DIAGNOSIS — I509 Heart failure, unspecified: Secondary | ICD-10-CM | POA: Diagnosis not present

## 2013-02-26 DIAGNOSIS — R911 Solitary pulmonary nodule: Secondary | ICD-10-CM | POA: Diagnosis not present

## 2013-02-26 DIAGNOSIS — K219 Gastro-esophageal reflux disease without esophagitis: Secondary | ICD-10-CM | POA: Diagnosis not present

## 2013-02-26 DIAGNOSIS — R0602 Shortness of breath: Secondary | ICD-10-CM | POA: Diagnosis not present

## 2013-02-26 DIAGNOSIS — J40 Bronchitis, not specified as acute or chronic: Secondary | ICD-10-CM | POA: Diagnosis not present

## 2013-02-26 DIAGNOSIS — R61 Generalized hyperhidrosis: Secondary | ICD-10-CM | POA: Diagnosis not present

## 2013-02-26 DIAGNOSIS — I4891 Unspecified atrial fibrillation: Secondary | ICD-10-CM | POA: Diagnosis not present

## 2013-02-26 DIAGNOSIS — J440 Chronic obstructive pulmonary disease with acute lower respiratory infection: Secondary | ICD-10-CM | POA: Diagnosis not present

## 2013-02-26 DIAGNOSIS — R079 Chest pain, unspecified: Secondary | ICD-10-CM | POA: Diagnosis not present

## 2013-02-26 DIAGNOSIS — E119 Type 2 diabetes mellitus without complications: Secondary | ICD-10-CM | POA: Diagnosis not present

## 2013-02-27 DIAGNOSIS — R079 Chest pain, unspecified: Secondary | ICD-10-CM | POA: Diagnosis not present

## 2013-02-27 DIAGNOSIS — R911 Solitary pulmonary nodule: Secondary | ICD-10-CM | POA: Diagnosis not present

## 2013-03-03 DIAGNOSIS — J209 Acute bronchitis, unspecified: Secondary | ICD-10-CM | POA: Diagnosis not present

## 2013-03-03 DIAGNOSIS — I4891 Unspecified atrial fibrillation: Secondary | ICD-10-CM | POA: Diagnosis not present

## 2013-03-03 DIAGNOSIS — Z7901 Long term (current) use of anticoagulants: Secondary | ICD-10-CM | POA: Diagnosis not present

## 2013-03-03 DIAGNOSIS — R9431 Abnormal electrocardiogram [ECG] [EKG]: Secondary | ICD-10-CM | POA: Diagnosis not present

## 2013-03-03 DIAGNOSIS — Z5181 Encounter for therapeutic drug level monitoring: Secondary | ICD-10-CM | POA: Diagnosis not present

## 2013-03-03 DIAGNOSIS — R079 Chest pain, unspecified: Secondary | ICD-10-CM | POA: Diagnosis not present

## 2013-03-03 DIAGNOSIS — R0602 Shortness of breath: Secondary | ICD-10-CM | POA: Diagnosis not present

## 2013-03-03 DIAGNOSIS — I499 Cardiac arrhythmia, unspecified: Secondary | ICD-10-CM | POA: Diagnosis not present

## 2013-03-04 DIAGNOSIS — IMO0001 Reserved for inherently not codable concepts without codable children: Secondary | ICD-10-CM | POA: Diagnosis not present

## 2013-03-04 DIAGNOSIS — M47817 Spondylosis without myelopathy or radiculopathy, lumbosacral region: Secondary | ICD-10-CM | POA: Diagnosis not present

## 2013-03-04 DIAGNOSIS — IMO0002 Reserved for concepts with insufficient information to code with codable children: Secondary | ICD-10-CM | POA: Diagnosis not present

## 2013-03-04 DIAGNOSIS — M5137 Other intervertebral disc degeneration, lumbosacral region: Secondary | ICD-10-CM | POA: Diagnosis not present

## 2013-03-04 DIAGNOSIS — G5600 Carpal tunnel syndrome, unspecified upper limb: Secondary | ICD-10-CM | POA: Diagnosis not present

## 2013-03-04 DIAGNOSIS — M961 Postlaminectomy syndrome, not elsewhere classified: Secondary | ICD-10-CM | POA: Diagnosis not present

## 2013-03-04 DIAGNOSIS — M25569 Pain in unspecified knee: Secondary | ICD-10-CM | POA: Diagnosis not present

## 2013-03-04 DIAGNOSIS — M5412 Radiculopathy, cervical region: Secondary | ICD-10-CM | POA: Diagnosis not present

## 2013-03-05 DIAGNOSIS — R05 Cough: Secondary | ICD-10-CM | POA: Diagnosis not present

## 2013-03-05 DIAGNOSIS — R059 Cough, unspecified: Secondary | ICD-10-CM | POA: Diagnosis not present

## 2013-03-05 DIAGNOSIS — R5381 Other malaise: Secondary | ICD-10-CM | POA: Diagnosis not present

## 2013-03-05 DIAGNOSIS — I10 Essential (primary) hypertension: Secondary | ICD-10-CM | POA: Diagnosis not present

## 2013-03-12 DIAGNOSIS — E119 Type 2 diabetes mellitus without complications: Secondary | ICD-10-CM | POA: Diagnosis not present

## 2013-03-12 DIAGNOSIS — I4891 Unspecified atrial fibrillation: Secondary | ICD-10-CM | POA: Diagnosis not present

## 2013-03-12 DIAGNOSIS — R9431 Abnormal electrocardiogram [ECG] [EKG]: Secondary | ICD-10-CM | POA: Diagnosis not present

## 2013-03-12 DIAGNOSIS — R0602 Shortness of breath: Secondary | ICD-10-CM | POA: Diagnosis not present

## 2013-03-13 DIAGNOSIS — Z01810 Encounter for preprocedural cardiovascular examination: Secondary | ICD-10-CM | POA: Diagnosis not present

## 2013-03-13 DIAGNOSIS — R079 Chest pain, unspecified: Secondary | ICD-10-CM | POA: Diagnosis not present

## 2013-03-13 DIAGNOSIS — Z7901 Long term (current) use of anticoagulants: Secondary | ICD-10-CM | POA: Diagnosis not present

## 2013-03-17 DIAGNOSIS — J449 Chronic obstructive pulmonary disease, unspecified: Secondary | ICD-10-CM | POA: Diagnosis not present

## 2013-03-17 DIAGNOSIS — E1149 Type 2 diabetes mellitus with other diabetic neurological complication: Secondary | ICD-10-CM | POA: Diagnosis not present

## 2013-03-17 DIAGNOSIS — I209 Angina pectoris, unspecified: Secondary | ICD-10-CM | POA: Diagnosis not present

## 2013-03-17 DIAGNOSIS — Z9981 Dependence on supplemental oxygen: Secondary | ICD-10-CM | POA: Diagnosis not present

## 2013-03-17 DIAGNOSIS — M1710 Unilateral primary osteoarthritis, unspecified knee: Secondary | ICD-10-CM | POA: Diagnosis not present

## 2013-03-17 DIAGNOSIS — I4891 Unspecified atrial fibrillation: Secondary | ICD-10-CM | POA: Diagnosis not present

## 2013-03-17 DIAGNOSIS — Z7901 Long term (current) use of anticoagulants: Secondary | ICD-10-CM | POA: Diagnosis not present

## 2013-03-17 DIAGNOSIS — I509 Heart failure, unspecified: Secondary | ICD-10-CM | POA: Diagnosis not present

## 2013-03-17 DIAGNOSIS — R0602 Shortness of breath: Secondary | ICD-10-CM | POA: Diagnosis not present

## 2013-03-17 DIAGNOSIS — R079 Chest pain, unspecified: Secondary | ICD-10-CM | POA: Diagnosis not present

## 2013-03-17 DIAGNOSIS — I10 Essential (primary) hypertension: Secondary | ICD-10-CM | POA: Diagnosis not present

## 2013-03-17 DIAGNOSIS — E785 Hyperlipidemia, unspecified: Secondary | ICD-10-CM | POA: Diagnosis not present

## 2013-03-17 DIAGNOSIS — Z6841 Body Mass Index (BMI) 40.0 and over, adult: Secondary | ICD-10-CM | POA: Diagnosis not present

## 2013-03-17 DIAGNOSIS — E1142 Type 2 diabetes mellitus with diabetic polyneuropathy: Secondary | ICD-10-CM | POA: Diagnosis not present

## 2013-03-23 DIAGNOSIS — R0989 Other specified symptoms and signs involving the circulatory and respiratory systems: Secondary | ICD-10-CM | POA: Diagnosis not present

## 2013-04-01 DIAGNOSIS — IMO0001 Reserved for inherently not codable concepts without codable children: Secondary | ICD-10-CM | POA: Diagnosis not present

## 2013-04-01 DIAGNOSIS — G5600 Carpal tunnel syndrome, unspecified upper limb: Secondary | ICD-10-CM | POA: Diagnosis not present

## 2013-04-01 DIAGNOSIS — M5137 Other intervertebral disc degeneration, lumbosacral region: Secondary | ICD-10-CM | POA: Diagnosis not present

## 2013-04-01 DIAGNOSIS — M5412 Radiculopathy, cervical region: Secondary | ICD-10-CM | POA: Diagnosis not present

## 2013-04-01 DIAGNOSIS — M25569 Pain in unspecified knee: Secondary | ICD-10-CM | POA: Diagnosis not present

## 2013-04-01 DIAGNOSIS — M47817 Spondylosis without myelopathy or radiculopathy, lumbosacral region: Secondary | ICD-10-CM | POA: Diagnosis not present

## 2013-04-01 DIAGNOSIS — G894 Chronic pain syndrome: Secondary | ICD-10-CM | POA: Diagnosis not present

## 2013-04-01 DIAGNOSIS — M961 Postlaminectomy syndrome, not elsewhere classified: Secondary | ICD-10-CM | POA: Diagnosis not present

## 2013-04-08 DIAGNOSIS — G589 Mononeuropathy, unspecified: Secondary | ICD-10-CM | POA: Diagnosis not present

## 2013-04-08 DIAGNOSIS — J441 Chronic obstructive pulmonary disease with (acute) exacerbation: Secondary | ICD-10-CM | POA: Diagnosis not present

## 2013-04-08 DIAGNOSIS — E785 Hyperlipidemia, unspecified: Secondary | ICD-10-CM | POA: Diagnosis not present

## 2013-04-08 DIAGNOSIS — J309 Allergic rhinitis, unspecified: Secondary | ICD-10-CM | POA: Diagnosis not present

## 2013-04-14 DIAGNOSIS — Z79899 Other long term (current) drug therapy: Secondary | ICD-10-CM | POA: Diagnosis not present

## 2013-04-14 DIAGNOSIS — Z7901 Long term (current) use of anticoagulants: Secondary | ICD-10-CM | POA: Diagnosis not present

## 2013-04-16 DIAGNOSIS — J209 Acute bronchitis, unspecified: Secondary | ICD-10-CM | POA: Diagnosis not present

## 2013-04-21 DIAGNOSIS — Z79899 Other long term (current) drug therapy: Secondary | ICD-10-CM | POA: Diagnosis not present

## 2013-04-21 DIAGNOSIS — E669 Obesity, unspecified: Secondary | ICD-10-CM | POA: Diagnosis not present

## 2013-04-21 DIAGNOSIS — IMO0002 Reserved for concepts with insufficient information to code with codable children: Secondary | ICD-10-CM | POA: Diagnosis not present

## 2013-04-21 DIAGNOSIS — E785 Hyperlipidemia, unspecified: Secondary | ICD-10-CM | POA: Diagnosis not present

## 2013-04-21 DIAGNOSIS — N179 Acute kidney failure, unspecified: Secondary | ICD-10-CM | POA: Diagnosis not present

## 2013-04-21 DIAGNOSIS — I509 Heart failure, unspecified: Secondary | ICD-10-CM | POA: Diagnosis not present

## 2013-04-21 DIAGNOSIS — G4733 Obstructive sleep apnea (adult) (pediatric): Secondary | ICD-10-CM | POA: Diagnosis not present

## 2013-04-21 DIAGNOSIS — I498 Other specified cardiac arrhythmias: Secondary | ICD-10-CM | POA: Diagnosis not present

## 2013-04-21 DIAGNOSIS — I4891 Unspecified atrial fibrillation: Secondary | ICD-10-CM | POA: Diagnosis not present

## 2013-04-21 DIAGNOSIS — F4310 Post-traumatic stress disorder, unspecified: Secondary | ICD-10-CM | POA: Diagnosis not present

## 2013-04-22 DIAGNOSIS — J0100 Acute maxillary sinusitis, unspecified: Secondary | ICD-10-CM | POA: Diagnosis not present

## 2013-04-22 DIAGNOSIS — R062 Wheezing: Secondary | ICD-10-CM | POA: Diagnosis not present

## 2013-04-22 DIAGNOSIS — IMO0001 Reserved for inherently not codable concepts without codable children: Secondary | ICD-10-CM | POA: Diagnosis not present

## 2013-04-23 DIAGNOSIS — J449 Chronic obstructive pulmonary disease, unspecified: Secondary | ICD-10-CM | POA: Diagnosis not present

## 2013-04-27 DIAGNOSIS — J449 Chronic obstructive pulmonary disease, unspecified: Secondary | ICD-10-CM | POA: Diagnosis not present

## 2013-05-04 DIAGNOSIS — J449 Chronic obstructive pulmonary disease, unspecified: Secondary | ICD-10-CM | POA: Diagnosis not present

## 2013-05-06 DIAGNOSIS — J449 Chronic obstructive pulmonary disease, unspecified: Secondary | ICD-10-CM | POA: Diagnosis not present

## 2013-05-08 DIAGNOSIS — N058 Unspecified nephritic syndrome with other morphologic changes: Secondary | ICD-10-CM | POA: Diagnosis not present

## 2013-05-08 DIAGNOSIS — E1129 Type 2 diabetes mellitus with other diabetic kidney complication: Secondary | ICD-10-CM | POA: Diagnosis not present

## 2013-05-08 DIAGNOSIS — E785 Hyperlipidemia, unspecified: Secondary | ICD-10-CM | POA: Diagnosis not present

## 2013-05-08 DIAGNOSIS — E1149 Type 2 diabetes mellitus with other diabetic neurological complication: Secondary | ICD-10-CM | POA: Diagnosis not present

## 2013-05-08 DIAGNOSIS — E1142 Type 2 diabetes mellitus with diabetic polyneuropathy: Secondary | ICD-10-CM | POA: Diagnosis not present

## 2013-05-11 DIAGNOSIS — J449 Chronic obstructive pulmonary disease, unspecified: Secondary | ICD-10-CM | POA: Diagnosis not present

## 2013-05-12 DIAGNOSIS — E1149 Type 2 diabetes mellitus with other diabetic neurological complication: Secondary | ICD-10-CM | POA: Diagnosis not present

## 2013-05-12 DIAGNOSIS — Z6841 Body Mass Index (BMI) 40.0 and over, adult: Secondary | ICD-10-CM | POA: Diagnosis not present

## 2013-05-12 DIAGNOSIS — Z794 Long term (current) use of insulin: Secondary | ICD-10-CM | POA: Diagnosis not present

## 2013-05-12 DIAGNOSIS — E1142 Type 2 diabetes mellitus with diabetic polyneuropathy: Secondary | ICD-10-CM | POA: Diagnosis not present

## 2013-05-12 DIAGNOSIS — E1129 Type 2 diabetes mellitus with other diabetic kidney complication: Secondary | ICD-10-CM | POA: Diagnosis not present

## 2013-05-12 DIAGNOSIS — E785 Hyperlipidemia, unspecified: Secondary | ICD-10-CM | POA: Diagnosis not present

## 2013-05-12 DIAGNOSIS — Z7901 Long term (current) use of anticoagulants: Secondary | ICD-10-CM | POA: Diagnosis not present

## 2013-05-12 DIAGNOSIS — Z79899 Other long term (current) drug therapy: Secondary | ICD-10-CM | POA: Diagnosis not present

## 2013-05-12 DIAGNOSIS — N058 Unspecified nephritic syndrome with other morphologic changes: Secondary | ICD-10-CM | POA: Diagnosis not present

## 2013-05-14 DIAGNOSIS — M5412 Radiculopathy, cervical region: Secondary | ICD-10-CM | POA: Diagnosis not present

## 2013-05-14 DIAGNOSIS — M961 Postlaminectomy syndrome, not elsewhere classified: Secondary | ICD-10-CM | POA: Diagnosis not present

## 2013-05-14 DIAGNOSIS — M5137 Other intervertebral disc degeneration, lumbosacral region: Secondary | ICD-10-CM | POA: Diagnosis not present

## 2013-05-14 DIAGNOSIS — G5600 Carpal tunnel syndrome, unspecified upper limb: Secondary | ICD-10-CM | POA: Diagnosis not present

## 2013-05-14 DIAGNOSIS — G894 Chronic pain syndrome: Secondary | ICD-10-CM | POA: Diagnosis not present

## 2013-05-14 DIAGNOSIS — IMO0002 Reserved for concepts with insufficient information to code with codable children: Secondary | ICD-10-CM | POA: Diagnosis not present

## 2013-05-14 DIAGNOSIS — M25569 Pain in unspecified knee: Secondary | ICD-10-CM | POA: Diagnosis not present

## 2013-05-18 DIAGNOSIS — J449 Chronic obstructive pulmonary disease, unspecified: Secondary | ICD-10-CM | POA: Diagnosis not present

## 2013-05-20 DIAGNOSIS — J449 Chronic obstructive pulmonary disease, unspecified: Secondary | ICD-10-CM | POA: Diagnosis not present

## 2013-05-21 DIAGNOSIS — J9610 Chronic respiratory failure, unspecified whether with hypoxia or hypercapnia: Secondary | ICD-10-CM | POA: Diagnosis not present

## 2013-05-25 DIAGNOSIS — J441 Chronic obstructive pulmonary disease with (acute) exacerbation: Secondary | ICD-10-CM | POA: Diagnosis not present

## 2013-05-25 DIAGNOSIS — R0602 Shortness of breath: Secondary | ICD-10-CM | POA: Diagnosis not present

## 2013-06-01 DIAGNOSIS — I998 Other disorder of circulatory system: Secondary | ICD-10-CM | POA: Diagnosis not present

## 2013-06-01 DIAGNOSIS — J441 Chronic obstructive pulmonary disease with (acute) exacerbation: Secondary | ICD-10-CM | POA: Diagnosis not present

## 2013-06-01 DIAGNOSIS — R609 Edema, unspecified: Secondary | ICD-10-CM | POA: Diagnosis not present

## 2013-06-10 DIAGNOSIS — IMO0001 Reserved for inherently not codable concepts without codable children: Secondary | ICD-10-CM | POA: Diagnosis not present

## 2013-06-10 DIAGNOSIS — M5412 Radiculopathy, cervical region: Secondary | ICD-10-CM | POA: Diagnosis not present

## 2013-06-10 DIAGNOSIS — M25569 Pain in unspecified knee: Secondary | ICD-10-CM | POA: Diagnosis not present

## 2013-06-10 DIAGNOSIS — J449 Chronic obstructive pulmonary disease, unspecified: Secondary | ICD-10-CM | POA: Diagnosis not present

## 2013-06-10 DIAGNOSIS — IMO0002 Reserved for concepts with insufficient information to code with codable children: Secondary | ICD-10-CM | POA: Diagnosis not present

## 2013-06-10 DIAGNOSIS — M5137 Other intervertebral disc degeneration, lumbosacral region: Secondary | ICD-10-CM | POA: Diagnosis not present

## 2013-06-10 DIAGNOSIS — M961 Postlaminectomy syndrome, not elsewhere classified: Secondary | ICD-10-CM | POA: Diagnosis not present

## 2013-06-10 DIAGNOSIS — G5600 Carpal tunnel syndrome, unspecified upper limb: Secondary | ICD-10-CM | POA: Diagnosis not present

## 2013-06-10 DIAGNOSIS — M47817 Spondylosis without myelopathy or radiculopathy, lumbosacral region: Secondary | ICD-10-CM | POA: Diagnosis not present

## 2013-06-14 DIAGNOSIS — B351 Tinea unguium: Secondary | ICD-10-CM | POA: Diagnosis not present

## 2013-06-14 DIAGNOSIS — E1149 Type 2 diabetes mellitus with other diabetic neurological complication: Secondary | ICD-10-CM | POA: Diagnosis not present

## 2013-06-14 DIAGNOSIS — M79609 Pain in unspecified limb: Secondary | ICD-10-CM | POA: Diagnosis not present

## 2013-06-14 DIAGNOSIS — L84 Corns and callosities: Secondary | ICD-10-CM | POA: Diagnosis not present

## 2013-06-15 DIAGNOSIS — J449 Chronic obstructive pulmonary disease, unspecified: Secondary | ICD-10-CM | POA: Diagnosis not present

## 2013-06-17 DIAGNOSIS — J449 Chronic obstructive pulmonary disease, unspecified: Secondary | ICD-10-CM | POA: Diagnosis not present

## 2013-06-28 DIAGNOSIS — Z79899 Other long term (current) drug therapy: Secondary | ICD-10-CM | POA: Diagnosis not present

## 2013-06-28 DIAGNOSIS — Z7901 Long term (current) use of anticoagulants: Secondary | ICD-10-CM | POA: Diagnosis not present

## 2013-06-29 DIAGNOSIS — J449 Chronic obstructive pulmonary disease, unspecified: Secondary | ICD-10-CM | POA: Diagnosis not present

## 2013-07-06 DIAGNOSIS — J449 Chronic obstructive pulmonary disease, unspecified: Secondary | ICD-10-CM | POA: Diagnosis not present

## 2013-07-07 DIAGNOSIS — R9431 Abnormal electrocardiogram [ECG] [EKG]: Secondary | ICD-10-CM | POA: Diagnosis not present

## 2013-07-07 DIAGNOSIS — I4891 Unspecified atrial fibrillation: Secondary | ICD-10-CM | POA: Diagnosis not present

## 2013-07-07 DIAGNOSIS — I059 Rheumatic mitral valve disease, unspecified: Secondary | ICD-10-CM | POA: Diagnosis not present

## 2013-07-07 DIAGNOSIS — I2589 Other forms of chronic ischemic heart disease: Secondary | ICD-10-CM | POA: Diagnosis not present

## 2013-07-08 DIAGNOSIS — M47817 Spondylosis without myelopathy or radiculopathy, lumbosacral region: Secondary | ICD-10-CM | POA: Diagnosis not present

## 2013-07-08 DIAGNOSIS — M961 Postlaminectomy syndrome, not elsewhere classified: Secondary | ICD-10-CM | POA: Diagnosis not present

## 2013-07-08 DIAGNOSIS — G5600 Carpal tunnel syndrome, unspecified upper limb: Secondary | ICD-10-CM | POA: Diagnosis not present

## 2013-07-08 DIAGNOSIS — IMO0002 Reserved for concepts with insufficient information to code with codable children: Secondary | ICD-10-CM | POA: Diagnosis not present

## 2013-07-08 DIAGNOSIS — M5137 Other intervertebral disc degeneration, lumbosacral region: Secondary | ICD-10-CM | POA: Diagnosis not present

## 2013-07-08 DIAGNOSIS — M25569 Pain in unspecified knee: Secondary | ICD-10-CM | POA: Diagnosis not present

## 2013-07-08 DIAGNOSIS — M5412 Radiculopathy, cervical region: Secondary | ICD-10-CM | POA: Diagnosis not present

## 2013-07-08 DIAGNOSIS — IMO0001 Reserved for inherently not codable concepts without codable children: Secondary | ICD-10-CM | POA: Diagnosis not present

## 2013-07-09 DIAGNOSIS — R1310 Dysphagia, unspecified: Secondary | ICD-10-CM | POA: Diagnosis not present

## 2013-07-09 DIAGNOSIS — R0789 Other chest pain: Secondary | ICD-10-CM | POA: Diagnosis not present

## 2013-07-09 DIAGNOSIS — M519 Unspecified thoracic, thoracolumbar and lumbosacral intervertebral disc disorder: Secondary | ICD-10-CM | POA: Diagnosis not present

## 2013-07-09 DIAGNOSIS — I889 Nonspecific lymphadenitis, unspecified: Secondary | ICD-10-CM | POA: Diagnosis not present

## 2013-07-13 DIAGNOSIS — J449 Chronic obstructive pulmonary disease, unspecified: Secondary | ICD-10-CM | POA: Diagnosis not present

## 2013-07-20 DIAGNOSIS — F4310 Post-traumatic stress disorder, unspecified: Secondary | ICD-10-CM | POA: Diagnosis not present

## 2013-07-20 DIAGNOSIS — E669 Obesity, unspecified: Secondary | ICD-10-CM | POA: Diagnosis not present

## 2013-07-20 DIAGNOSIS — I4891 Unspecified atrial fibrillation: Secondary | ICD-10-CM | POA: Diagnosis not present

## 2013-07-20 DIAGNOSIS — I509 Heart failure, unspecified: Secondary | ICD-10-CM | POA: Diagnosis not present

## 2013-07-20 DIAGNOSIS — IMO0002 Reserved for concepts with insufficient information to code with codable children: Secondary | ICD-10-CM | POA: Diagnosis not present

## 2013-07-20 DIAGNOSIS — I4892 Unspecified atrial flutter: Secondary | ICD-10-CM | POA: Diagnosis not present

## 2013-07-20 DIAGNOSIS — J209 Acute bronchitis, unspecified: Secondary | ICD-10-CM | POA: Diagnosis not present

## 2013-07-20 DIAGNOSIS — J111 Influenza due to unidentified influenza virus with other respiratory manifestations: Secondary | ICD-10-CM | POA: Diagnosis not present

## 2013-07-20 DIAGNOSIS — G4733 Obstructive sleep apnea (adult) (pediatric): Secondary | ICD-10-CM | POA: Diagnosis not present

## 2013-07-28 DIAGNOSIS — L570 Actinic keratosis: Secondary | ICD-10-CM | POA: Diagnosis not present

## 2013-07-30 DIAGNOSIS — R350 Frequency of micturition: Secondary | ICD-10-CM | POA: Diagnosis not present

## 2013-07-30 DIAGNOSIS — R3915 Urgency of urination: Secondary | ICD-10-CM | POA: Diagnosis not present

## 2013-08-05 DIAGNOSIS — M5137 Other intervertebral disc degeneration, lumbosacral region: Secondary | ICD-10-CM | POA: Diagnosis not present

## 2013-08-05 DIAGNOSIS — M47817 Spondylosis without myelopathy or radiculopathy, lumbosacral region: Secondary | ICD-10-CM | POA: Diagnosis not present

## 2013-08-05 DIAGNOSIS — IMO0002 Reserved for concepts with insufficient information to code with codable children: Secondary | ICD-10-CM | POA: Diagnosis not present

## 2013-08-05 DIAGNOSIS — M5412 Radiculopathy, cervical region: Secondary | ICD-10-CM | POA: Diagnosis not present

## 2013-08-05 DIAGNOSIS — M25569 Pain in unspecified knee: Secondary | ICD-10-CM | POA: Diagnosis not present

## 2013-08-05 DIAGNOSIS — M961 Postlaminectomy syndrome, not elsewhere classified: Secondary | ICD-10-CM | POA: Diagnosis not present

## 2013-08-05 DIAGNOSIS — M159 Polyosteoarthritis, unspecified: Secondary | ICD-10-CM | POA: Diagnosis not present

## 2013-08-05 DIAGNOSIS — M1710 Unilateral primary osteoarthritis, unspecified knee: Secondary | ICD-10-CM | POA: Diagnosis not present

## 2013-08-05 DIAGNOSIS — G5600 Carpal tunnel syndrome, unspecified upper limb: Secondary | ICD-10-CM | POA: Diagnosis not present

## 2013-08-09 DIAGNOSIS — L2089 Other atopic dermatitis: Secondary | ICD-10-CM | POA: Diagnosis not present

## 2013-08-27 DIAGNOSIS — J449 Chronic obstructive pulmonary disease, unspecified: Secondary | ICD-10-CM | POA: Diagnosis not present

## 2013-08-27 DIAGNOSIS — J9610 Chronic respiratory failure, unspecified whether with hypoxia or hypercapnia: Secondary | ICD-10-CM | POA: Diagnosis not present

## 2013-08-27 DIAGNOSIS — G4733 Obstructive sleep apnea (adult) (pediatric): Secondary | ICD-10-CM | POA: Diagnosis not present

## 2013-08-27 DIAGNOSIS — J309 Allergic rhinitis, unspecified: Secondary | ICD-10-CM | POA: Diagnosis not present

## 2013-08-30 DIAGNOSIS — Z7901 Long term (current) use of anticoagulants: Secondary | ICD-10-CM | POA: Diagnosis not present

## 2013-08-30 DIAGNOSIS — Z79899 Other long term (current) drug therapy: Secondary | ICD-10-CM | POA: Diagnosis not present

## 2013-08-30 DIAGNOSIS — I4891 Unspecified atrial fibrillation: Secondary | ICD-10-CM | POA: Diagnosis not present

## 2013-09-02 DIAGNOSIS — IMO0001 Reserved for inherently not codable concepts without codable children: Secondary | ICD-10-CM | POA: Diagnosis not present

## 2013-09-02 DIAGNOSIS — M5137 Other intervertebral disc degeneration, lumbosacral region: Secondary | ICD-10-CM | POA: Diagnosis not present

## 2013-09-02 DIAGNOSIS — G5600 Carpal tunnel syndrome, unspecified upper limb: Secondary | ICD-10-CM | POA: Diagnosis not present

## 2013-09-02 DIAGNOSIS — M25569 Pain in unspecified knee: Secondary | ICD-10-CM | POA: Diagnosis not present

## 2013-09-02 DIAGNOSIS — M5412 Radiculopathy, cervical region: Secondary | ICD-10-CM | POA: Diagnosis not present

## 2013-09-02 DIAGNOSIS — IMO0002 Reserved for concepts with insufficient information to code with codable children: Secondary | ICD-10-CM | POA: Diagnosis not present

## 2013-09-02 DIAGNOSIS — M47817 Spondylosis without myelopathy or radiculopathy, lumbosacral region: Secondary | ICD-10-CM | POA: Diagnosis not present

## 2013-09-02 DIAGNOSIS — M961 Postlaminectomy syndrome, not elsewhere classified: Secondary | ICD-10-CM | POA: Diagnosis not present

## 2013-09-03 DIAGNOSIS — I4891 Unspecified atrial fibrillation: Secondary | ICD-10-CM | POA: Diagnosis not present

## 2013-09-03 DIAGNOSIS — F4310 Post-traumatic stress disorder, unspecified: Secondary | ICD-10-CM | POA: Diagnosis not present

## 2013-09-03 DIAGNOSIS — Z794 Long term (current) use of insulin: Secondary | ICD-10-CM | POA: Diagnosis not present

## 2013-09-03 DIAGNOSIS — I509 Heart failure, unspecified: Secondary | ICD-10-CM | POA: Diagnosis not present

## 2013-09-03 DIAGNOSIS — G4733 Obstructive sleep apnea (adult) (pediatric): Secondary | ICD-10-CM | POA: Diagnosis not present

## 2013-09-03 DIAGNOSIS — IMO0002 Reserved for concepts with insufficient information to code with codable children: Secondary | ICD-10-CM | POA: Diagnosis not present

## 2013-09-03 DIAGNOSIS — Z7901 Long term (current) use of anticoagulants: Secondary | ICD-10-CM | POA: Diagnosis not present

## 2013-09-03 DIAGNOSIS — E669 Obesity, unspecified: Secondary | ICD-10-CM | POA: Diagnosis not present

## 2013-09-07 DIAGNOSIS — E785 Hyperlipidemia, unspecified: Secondary | ICD-10-CM | POA: Diagnosis not present

## 2013-09-07 DIAGNOSIS — E119 Type 2 diabetes mellitus without complications: Secondary | ICD-10-CM | POA: Diagnosis not present

## 2013-09-09 DIAGNOSIS — D1739 Benign lipomatous neoplasm of skin and subcutaneous tissue of other sites: Secondary | ICD-10-CM | POA: Diagnosis not present

## 2013-09-09 DIAGNOSIS — L2089 Other atopic dermatitis: Secondary | ICD-10-CM | POA: Diagnosis not present

## 2013-09-10 DIAGNOSIS — E1129 Type 2 diabetes mellitus with other diabetic kidney complication: Secondary | ICD-10-CM | POA: Diagnosis not present

## 2013-09-10 DIAGNOSIS — E1149 Type 2 diabetes mellitus with other diabetic neurological complication: Secondary | ICD-10-CM | POA: Diagnosis not present

## 2013-09-10 DIAGNOSIS — Z794 Long term (current) use of insulin: Secondary | ICD-10-CM | POA: Diagnosis not present

## 2013-09-10 DIAGNOSIS — J449 Chronic obstructive pulmonary disease, unspecified: Secondary | ICD-10-CM | POA: Diagnosis not present

## 2013-09-10 DIAGNOSIS — Z6841 Body Mass Index (BMI) 40.0 and over, adult: Secondary | ICD-10-CM | POA: Diagnosis not present

## 2013-09-10 DIAGNOSIS — Z9981 Dependence on supplemental oxygen: Secondary | ICD-10-CM | POA: Diagnosis not present

## 2013-09-10 DIAGNOSIS — E785 Hyperlipidemia, unspecified: Secondary | ICD-10-CM | POA: Diagnosis not present

## 2013-09-10 DIAGNOSIS — E1142 Type 2 diabetes mellitus with diabetic polyneuropathy: Secondary | ICD-10-CM | POA: Diagnosis not present

## 2013-09-10 DIAGNOSIS — N058 Unspecified nephritic syndrome with other morphologic changes: Secondary | ICD-10-CM | POA: Diagnosis not present

## 2013-09-10 DIAGNOSIS — E1165 Type 2 diabetes mellitus with hyperglycemia: Secondary | ICD-10-CM | POA: Diagnosis not present

## 2013-09-13 DIAGNOSIS — Z79899 Other long term (current) drug therapy: Secondary | ICD-10-CM | POA: Diagnosis not present

## 2013-09-13 DIAGNOSIS — Z7901 Long term (current) use of anticoagulants: Secondary | ICD-10-CM | POA: Diagnosis not present

## 2013-09-13 DIAGNOSIS — I4891 Unspecified atrial fibrillation: Secondary | ICD-10-CM | POA: Diagnosis not present

## 2013-09-28 DIAGNOSIS — Z79899 Other long term (current) drug therapy: Secondary | ICD-10-CM | POA: Diagnosis not present

## 2013-09-28 DIAGNOSIS — I4891 Unspecified atrial fibrillation: Secondary | ICD-10-CM | POA: Diagnosis not present

## 2013-09-28 DIAGNOSIS — Z7901 Long term (current) use of anticoagulants: Secondary | ICD-10-CM | POA: Diagnosis not present

## 2013-09-30 DIAGNOSIS — M5412 Radiculopathy, cervical region: Secondary | ICD-10-CM | POA: Diagnosis not present

## 2013-09-30 DIAGNOSIS — IMO0002 Reserved for concepts with insufficient information to code with codable children: Secondary | ICD-10-CM | POA: Diagnosis not present

## 2013-09-30 DIAGNOSIS — M47817 Spondylosis without myelopathy or radiculopathy, lumbosacral region: Secondary | ICD-10-CM | POA: Diagnosis not present

## 2013-09-30 DIAGNOSIS — G5600 Carpal tunnel syndrome, unspecified upper limb: Secondary | ICD-10-CM | POA: Diagnosis not present

## 2013-09-30 DIAGNOSIS — M5137 Other intervertebral disc degeneration, lumbosacral region: Secondary | ICD-10-CM | POA: Diagnosis not present

## 2013-09-30 DIAGNOSIS — G5610 Other lesions of median nerve, unspecified upper limb: Secondary | ICD-10-CM | POA: Diagnosis not present

## 2013-09-30 DIAGNOSIS — M25569 Pain in unspecified knee: Secondary | ICD-10-CM | POA: Diagnosis not present

## 2013-09-30 DIAGNOSIS — M961 Postlaminectomy syndrome, not elsewhere classified: Secondary | ICD-10-CM | POA: Diagnosis not present

## 2013-10-07 DIAGNOSIS — Z79899 Other long term (current) drug therapy: Secondary | ICD-10-CM | POA: Diagnosis not present

## 2013-10-07 DIAGNOSIS — I4891 Unspecified atrial fibrillation: Secondary | ICD-10-CM | POA: Diagnosis not present

## 2013-10-07 DIAGNOSIS — Z7901 Long term (current) use of anticoagulants: Secondary | ICD-10-CM | POA: Diagnosis not present

## 2013-10-11 DIAGNOSIS — R110 Nausea: Secondary | ICD-10-CM | POA: Diagnosis not present

## 2013-10-11 DIAGNOSIS — E878 Other disorders of electrolyte and fluid balance, not elsewhere classified: Secondary | ICD-10-CM | POA: Diagnosis not present

## 2013-10-11 DIAGNOSIS — Z79899 Other long term (current) drug therapy: Secondary | ICD-10-CM | POA: Diagnosis not present

## 2013-10-11 DIAGNOSIS — I10 Essential (primary) hypertension: Secondary | ICD-10-CM | POA: Diagnosis not present

## 2013-10-19 DIAGNOSIS — M79609 Pain in unspecified limb: Secondary | ICD-10-CM | POA: Diagnosis not present

## 2013-10-19 DIAGNOSIS — L84 Corns and callosities: Secondary | ICD-10-CM | POA: Diagnosis not present

## 2013-10-19 DIAGNOSIS — E1149 Type 2 diabetes mellitus with other diabetic neurological complication: Secondary | ICD-10-CM | POA: Diagnosis not present

## 2013-10-19 DIAGNOSIS — E1159 Type 2 diabetes mellitus with other circulatory complications: Secondary | ICD-10-CM | POA: Diagnosis not present

## 2013-10-22 DIAGNOSIS — J309 Allergic rhinitis, unspecified: Secondary | ICD-10-CM | POA: Diagnosis not present

## 2013-10-22 DIAGNOSIS — J4489 Other specified chronic obstructive pulmonary disease: Secondary | ICD-10-CM | POA: Diagnosis not present

## 2013-10-22 DIAGNOSIS — G4733 Obstructive sleep apnea (adult) (pediatric): Secondary | ICD-10-CM | POA: Diagnosis not present

## 2013-10-22 DIAGNOSIS — J449 Chronic obstructive pulmonary disease, unspecified: Secondary | ICD-10-CM | POA: Diagnosis not present

## 2013-10-22 DIAGNOSIS — J9610 Chronic respiratory failure, unspecified whether with hypoxia or hypercapnia: Secondary | ICD-10-CM | POA: Diagnosis not present

## 2013-10-26 DIAGNOSIS — G894 Chronic pain syndrome: Secondary | ICD-10-CM | POA: Diagnosis not present

## 2013-10-26 DIAGNOSIS — G5600 Carpal tunnel syndrome, unspecified upper limb: Secondary | ICD-10-CM | POA: Diagnosis not present

## 2013-10-26 DIAGNOSIS — M961 Postlaminectomy syndrome, not elsewhere classified: Secondary | ICD-10-CM | POA: Diagnosis not present

## 2013-10-26 DIAGNOSIS — M25569 Pain in unspecified knee: Secondary | ICD-10-CM | POA: Diagnosis not present

## 2013-10-26 DIAGNOSIS — IMO0001 Reserved for inherently not codable concepts without codable children: Secondary | ICD-10-CM | POA: Diagnosis not present

## 2013-10-26 DIAGNOSIS — M5137 Other intervertebral disc degeneration, lumbosacral region: Secondary | ICD-10-CM | POA: Diagnosis not present

## 2013-10-29 DIAGNOSIS — M5137 Other intervertebral disc degeneration, lumbosacral region: Secondary | ICD-10-CM | POA: Diagnosis not present

## 2013-10-29 DIAGNOSIS — M159 Polyosteoarthritis, unspecified: Secondary | ICD-10-CM | POA: Diagnosis not present

## 2013-10-29 DIAGNOSIS — G5600 Carpal tunnel syndrome, unspecified upper limb: Secondary | ICD-10-CM | POA: Diagnosis not present

## 2013-10-29 DIAGNOSIS — M25569 Pain in unspecified knee: Secondary | ICD-10-CM | POA: Diagnosis not present

## 2013-10-29 DIAGNOSIS — IMO0002 Reserved for concepts with insufficient information to code with codable children: Secondary | ICD-10-CM | POA: Diagnosis not present

## 2013-10-29 DIAGNOSIS — M1710 Unilateral primary osteoarthritis, unspecified knee: Secondary | ICD-10-CM | POA: Diagnosis not present

## 2013-10-29 DIAGNOSIS — M961 Postlaminectomy syndrome, not elsewhere classified: Secondary | ICD-10-CM | POA: Diagnosis not present

## 2013-10-29 DIAGNOSIS — M47817 Spondylosis without myelopathy or radiculopathy, lumbosacral region: Secondary | ICD-10-CM | POA: Diagnosis not present

## 2013-10-29 DIAGNOSIS — M5412 Radiculopathy, cervical region: Secondary | ICD-10-CM | POA: Diagnosis not present

## 2013-11-01 DIAGNOSIS — M545 Low back pain, unspecified: Secondary | ICD-10-CM | POA: Diagnosis not present

## 2013-11-01 DIAGNOSIS — Q762 Congenital spondylolisthesis: Secondary | ICD-10-CM | POA: Diagnosis not present

## 2013-11-01 DIAGNOSIS — M519 Unspecified thoracic, thoracolumbar and lumbosacral intervertebral disc disorder: Secondary | ICD-10-CM | POA: Diagnosis not present

## 2013-11-08 DIAGNOSIS — I499 Cardiac arrhythmia, unspecified: Secondary | ICD-10-CM | POA: Diagnosis not present

## 2013-11-08 DIAGNOSIS — I119 Hypertensive heart disease without heart failure: Secondary | ICD-10-CM | POA: Diagnosis not present

## 2013-11-08 DIAGNOSIS — R011 Cardiac murmur, unspecified: Secondary | ICD-10-CM | POA: Diagnosis not present

## 2013-11-08 DIAGNOSIS — I4891 Unspecified atrial fibrillation: Secondary | ICD-10-CM | POA: Diagnosis not present

## 2013-11-22 DIAGNOSIS — Z7901 Long term (current) use of anticoagulants: Secondary | ICD-10-CM | POA: Diagnosis not present

## 2013-11-22 DIAGNOSIS — Z79899 Other long term (current) drug therapy: Secondary | ICD-10-CM | POA: Diagnosis not present

## 2013-11-22 DIAGNOSIS — I4891 Unspecified atrial fibrillation: Secondary | ICD-10-CM | POA: Diagnosis not present

## 2013-12-01 DIAGNOSIS — I4891 Unspecified atrial fibrillation: Secondary | ICD-10-CM | POA: Diagnosis not present

## 2013-12-01 DIAGNOSIS — R0602 Shortness of breath: Secondary | ICD-10-CM | POA: Diagnosis not present

## 2013-12-23 DIAGNOSIS — M961 Postlaminectomy syndrome, not elsewhere classified: Secondary | ICD-10-CM | POA: Diagnosis not present

## 2013-12-23 DIAGNOSIS — IMO0002 Reserved for concepts with insufficient information to code with codable children: Secondary | ICD-10-CM | POA: Diagnosis not present

## 2013-12-23 DIAGNOSIS — IMO0001 Reserved for inherently not codable concepts without codable children: Secondary | ICD-10-CM | POA: Diagnosis not present

## 2013-12-23 DIAGNOSIS — M5412 Radiculopathy, cervical region: Secondary | ICD-10-CM | POA: Diagnosis not present

## 2013-12-23 DIAGNOSIS — G5600 Carpal tunnel syndrome, unspecified upper limb: Secondary | ICD-10-CM | POA: Diagnosis not present

## 2013-12-23 DIAGNOSIS — M47817 Spondylosis without myelopathy or radiculopathy, lumbosacral region: Secondary | ICD-10-CM | POA: Diagnosis not present

## 2013-12-23 DIAGNOSIS — M25569 Pain in unspecified knee: Secondary | ICD-10-CM | POA: Diagnosis not present

## 2013-12-23 DIAGNOSIS — M5137 Other intervertebral disc degeneration, lumbosacral region: Secondary | ICD-10-CM | POA: Diagnosis not present

## 2013-12-29 DIAGNOSIS — J209 Acute bronchitis, unspecified: Secondary | ICD-10-CM | POA: Diagnosis present

## 2013-12-29 DIAGNOSIS — G4733 Obstructive sleep apnea (adult) (pediatric): Secondary | ICD-10-CM | POA: Diagnosis not present

## 2013-12-29 DIAGNOSIS — I4891 Unspecified atrial fibrillation: Secondary | ICD-10-CM | POA: Diagnosis not present

## 2013-12-29 DIAGNOSIS — E119 Type 2 diabetes mellitus without complications: Secondary | ICD-10-CM | POA: Diagnosis not present

## 2013-12-29 DIAGNOSIS — J45901 Unspecified asthma with (acute) exacerbation: Secondary | ICD-10-CM | POA: Diagnosis present

## 2013-12-29 DIAGNOSIS — I2789 Other specified pulmonary heart diseases: Secondary | ICD-10-CM | POA: Diagnosis not present

## 2013-12-29 DIAGNOSIS — R6510 Systemic inflammatory response syndrome (SIRS) of non-infectious origin without acute organ dysfunction: Secondary | ICD-10-CM | POA: Diagnosis not present

## 2013-12-29 DIAGNOSIS — K219 Gastro-esophageal reflux disease without esophagitis: Secondary | ICD-10-CM | POA: Diagnosis present

## 2013-12-29 DIAGNOSIS — J218 Acute bronchiolitis due to other specified organisms: Secondary | ICD-10-CM | POA: Diagnosis not present

## 2013-12-29 DIAGNOSIS — F319 Bipolar disorder, unspecified: Secondary | ICD-10-CM | POA: Diagnosis present

## 2013-12-29 DIAGNOSIS — R0609 Other forms of dyspnea: Secondary | ICD-10-CM | POA: Diagnosis not present

## 2013-12-29 DIAGNOSIS — E860 Dehydration: Secondary | ICD-10-CM | POA: Diagnosis present

## 2013-12-29 DIAGNOSIS — F17200 Nicotine dependence, unspecified, uncomplicated: Secondary | ICD-10-CM | POA: Diagnosis present

## 2013-12-29 DIAGNOSIS — Z6841 Body Mass Index (BMI) 40.0 and over, adult: Secondary | ICD-10-CM | POA: Diagnosis not present

## 2013-12-29 DIAGNOSIS — G8929 Other chronic pain: Secondary | ICD-10-CM | POA: Diagnosis present

## 2013-12-29 DIAGNOSIS — J449 Chronic obstructive pulmonary disease, unspecified: Secondary | ICD-10-CM | POA: Diagnosis not present

## 2013-12-29 DIAGNOSIS — J440 Chronic obstructive pulmonary disease with acute lower respiratory infection: Secondary | ICD-10-CM | POA: Diagnosis not present

## 2013-12-29 DIAGNOSIS — I5041 Acute combined systolic (congestive) and diastolic (congestive) heart failure: Secondary | ICD-10-CM | POA: Diagnosis not present

## 2013-12-29 DIAGNOSIS — E662 Morbid (severe) obesity with alveolar hypoventilation: Secondary | ICD-10-CM | POA: Diagnosis not present

## 2013-12-29 DIAGNOSIS — J90 Pleural effusion, not elsewhere classified: Secondary | ICD-10-CM | POA: Diagnosis not present

## 2013-12-29 DIAGNOSIS — R0602 Shortness of breath: Secondary | ICD-10-CM | POA: Diagnosis not present

## 2013-12-29 DIAGNOSIS — Z9981 Dependence on supplemental oxygen: Secondary | ICD-10-CM | POA: Diagnosis not present

## 2013-12-29 DIAGNOSIS — J441 Chronic obstructive pulmonary disease with (acute) exacerbation: Secondary | ICD-10-CM | POA: Diagnosis not present

## 2013-12-29 DIAGNOSIS — B029 Zoster without complications: Secondary | ICD-10-CM | POA: Diagnosis not present

## 2013-12-29 DIAGNOSIS — IMO0001 Reserved for inherently not codable concepts without codable children: Secondary | ICD-10-CM | POA: Diagnosis present

## 2013-12-30 DIAGNOSIS — I5041 Acute combined systolic (congestive) and diastolic (congestive) heart failure: Secondary | ICD-10-CM | POA: Diagnosis not present

## 2013-12-30 DIAGNOSIS — J441 Chronic obstructive pulmonary disease with (acute) exacerbation: Secondary | ICD-10-CM | POA: Diagnosis not present

## 2013-12-30 DIAGNOSIS — B029 Zoster without complications: Secondary | ICD-10-CM | POA: Diagnosis not present

## 2013-12-30 DIAGNOSIS — J218 Acute bronchiolitis due to other specified organisms: Secondary | ICD-10-CM | POA: Diagnosis not present

## 2013-12-31 DIAGNOSIS — B029 Zoster without complications: Secondary | ICD-10-CM | POA: Diagnosis not present

## 2013-12-31 DIAGNOSIS — J218 Acute bronchiolitis due to other specified organisms: Secondary | ICD-10-CM | POA: Diagnosis not present

## 2013-12-31 DIAGNOSIS — J441 Chronic obstructive pulmonary disease with (acute) exacerbation: Secondary | ICD-10-CM | POA: Diagnosis not present

## 2013-12-31 DIAGNOSIS — I5041 Acute combined systolic (congestive) and diastolic (congestive) heart failure: Secondary | ICD-10-CM | POA: Diagnosis not present

## 2014-01-02 DIAGNOSIS — J441 Chronic obstructive pulmonary disease with (acute) exacerbation: Secondary | ICD-10-CM | POA: Diagnosis not present

## 2014-01-02 DIAGNOSIS — J218 Acute bronchiolitis due to other specified organisms: Secondary | ICD-10-CM | POA: Diagnosis not present

## 2014-01-02 DIAGNOSIS — I5041 Acute combined systolic (congestive) and diastolic (congestive) heart failure: Secondary | ICD-10-CM | POA: Diagnosis not present

## 2014-01-02 DIAGNOSIS — B029 Zoster without complications: Secondary | ICD-10-CM | POA: Diagnosis not present

## 2014-01-03 DIAGNOSIS — E785 Hyperlipidemia, unspecified: Secondary | ICD-10-CM | POA: Diagnosis not present

## 2014-01-03 DIAGNOSIS — J449 Chronic obstructive pulmonary disease, unspecified: Secondary | ICD-10-CM | POA: Diagnosis not present

## 2014-01-03 DIAGNOSIS — Z794 Long term (current) use of insulin: Secondary | ICD-10-CM | POA: Diagnosis not present

## 2014-01-03 DIAGNOSIS — Z9981 Dependence on supplemental oxygen: Secondary | ICD-10-CM | POA: Diagnosis not present

## 2014-01-03 DIAGNOSIS — E1142 Type 2 diabetes mellitus with diabetic polyneuropathy: Secondary | ICD-10-CM | POA: Diagnosis not present

## 2014-01-03 DIAGNOSIS — N058 Unspecified nephritic syndrome with other morphologic changes: Secondary | ICD-10-CM | POA: Diagnosis not present

## 2014-01-03 DIAGNOSIS — Z6841 Body Mass Index (BMI) 40.0 and over, adult: Secondary | ICD-10-CM | POA: Diagnosis not present

## 2014-01-03 DIAGNOSIS — E1129 Type 2 diabetes mellitus with other diabetic kidney complication: Secondary | ICD-10-CM | POA: Diagnosis not present

## 2014-01-03 DIAGNOSIS — E1149 Type 2 diabetes mellitus with other diabetic neurological complication: Secondary | ICD-10-CM | POA: Diagnosis not present

## 2014-01-05 DIAGNOSIS — J441 Chronic obstructive pulmonary disease with (acute) exacerbation: Secondary | ICD-10-CM | POA: Diagnosis not present

## 2014-01-05 DIAGNOSIS — R110 Nausea: Secondary | ICD-10-CM | POA: Diagnosis not present

## 2014-01-05 DIAGNOSIS — Z09 Encounter for follow-up examination after completed treatment for conditions other than malignant neoplasm: Secondary | ICD-10-CM | POA: Diagnosis not present

## 2014-01-12 DIAGNOSIS — B0089 Other herpesviral infection: Secondary | ICD-10-CM | POA: Diagnosis not present

## 2014-01-12 DIAGNOSIS — D1739 Benign lipomatous neoplasm of skin and subcutaneous tissue of other sites: Secondary | ICD-10-CM | POA: Diagnosis not present

## 2014-01-12 DIAGNOSIS — R0789 Other chest pain: Secondary | ICD-10-CM | POA: Diagnosis not present

## 2014-01-12 DIAGNOSIS — IMO0001 Reserved for inherently not codable concepts without codable children: Secondary | ICD-10-CM | POA: Diagnosis not present

## 2014-01-12 DIAGNOSIS — R1310 Dysphagia, unspecified: Secondary | ICD-10-CM | POA: Diagnosis not present

## 2014-01-12 DIAGNOSIS — I998 Other disorder of circulatory system: Secondary | ICD-10-CM | POA: Diagnosis not present

## 2014-01-12 DIAGNOSIS — R0602 Shortness of breath: Secondary | ICD-10-CM | POA: Diagnosis not present

## 2014-01-12 DIAGNOSIS — L2089 Other atopic dermatitis: Secondary | ICD-10-CM | POA: Diagnosis not present

## 2014-01-12 DIAGNOSIS — I889 Nonspecific lymphadenitis, unspecified: Secondary | ICD-10-CM | POA: Diagnosis not present

## 2014-01-12 DIAGNOSIS — K2970 Gastritis, unspecified, without bleeding: Secondary | ICD-10-CM | POA: Diagnosis not present

## 2014-01-12 DIAGNOSIS — R110 Nausea: Secondary | ICD-10-CM | POA: Diagnosis not present

## 2014-01-13 DIAGNOSIS — I4891 Unspecified atrial fibrillation: Secondary | ICD-10-CM | POA: Diagnosis not present

## 2014-01-13 DIAGNOSIS — Z79899 Other long term (current) drug therapy: Secondary | ICD-10-CM | POA: Diagnosis not present

## 2014-01-13 DIAGNOSIS — Z7901 Long term (current) use of anticoagulants: Secondary | ICD-10-CM | POA: Diagnosis not present

## 2014-01-15 DIAGNOSIS — L0100 Impetigo, unspecified: Secondary | ICD-10-CM | POA: Diagnosis not present

## 2014-01-18 DIAGNOSIS — M79609 Pain in unspecified limb: Secondary | ICD-10-CM | POA: Diagnosis not present

## 2014-01-18 DIAGNOSIS — E1149 Type 2 diabetes mellitus with other diabetic neurological complication: Secondary | ICD-10-CM | POA: Diagnosis not present

## 2014-01-18 DIAGNOSIS — E1159 Type 2 diabetes mellitus with other circulatory complications: Secondary | ICD-10-CM | POA: Diagnosis not present

## 2014-01-18 DIAGNOSIS — L84 Corns and callosities: Secondary | ICD-10-CM | POA: Diagnosis not present

## 2014-01-20 DIAGNOSIS — Z79899 Other long term (current) drug therapy: Secondary | ICD-10-CM | POA: Diagnosis not present

## 2014-01-20 DIAGNOSIS — IMO0001 Reserved for inherently not codable concepts without codable children: Secondary | ICD-10-CM | POA: Diagnosis not present

## 2014-01-20 DIAGNOSIS — M5412 Radiculopathy, cervical region: Secondary | ICD-10-CM | POA: Diagnosis not present

## 2014-01-20 DIAGNOSIS — M5137 Other intervertebral disc degeneration, lumbosacral region: Secondary | ICD-10-CM | POA: Diagnosis not present

## 2014-01-20 DIAGNOSIS — M47817 Spondylosis without myelopathy or radiculopathy, lumbosacral region: Secondary | ICD-10-CM | POA: Diagnosis not present

## 2014-01-20 DIAGNOSIS — G5600 Carpal tunnel syndrome, unspecified upper limb: Secondary | ICD-10-CM | POA: Diagnosis not present

## 2014-01-20 DIAGNOSIS — M25569 Pain in unspecified knee: Secondary | ICD-10-CM | POA: Diagnosis not present

## 2014-01-20 DIAGNOSIS — I4891 Unspecified atrial fibrillation: Secondary | ICD-10-CM | POA: Diagnosis not present

## 2014-01-20 DIAGNOSIS — IMO0002 Reserved for concepts with insufficient information to code with codable children: Secondary | ICD-10-CM | POA: Diagnosis not present

## 2014-01-20 DIAGNOSIS — M961 Postlaminectomy syndrome, not elsewhere classified: Secondary | ICD-10-CM | POA: Diagnosis not present

## 2014-01-20 DIAGNOSIS — Z7901 Long term (current) use of anticoagulants: Secondary | ICD-10-CM | POA: Diagnosis not present

## 2014-01-21 DIAGNOSIS — L080 Pyoderma: Secondary | ICD-10-CM | POA: Diagnosis not present

## 2014-02-05 ENCOUNTER — Encounter (HOSPITAL_COMMUNITY): Payer: Self-pay | Admitting: Emergency Medicine

## 2014-02-05 ENCOUNTER — Emergency Department (HOSPITAL_COMMUNITY)
Admission: EM | Admit: 2014-02-05 | Discharge: 2014-02-05 | Disposition: A | Discharge status: Home / Self Care | Payer: Medicare Other | Attending: Emergency Medicine | Admitting: Emergency Medicine

## 2014-02-05 DIAGNOSIS — I509 Heart failure, unspecified: Secondary | ICD-10-CM | POA: Diagnosis not present

## 2014-02-05 DIAGNOSIS — J4489 Other specified chronic obstructive pulmonary disease: Secondary | ICD-10-CM | POA: Diagnosis not present

## 2014-02-05 DIAGNOSIS — Z791 Long term (current) use of non-steroidal anti-inflammatories (NSAID): Secondary | ICD-10-CM | POA: Insufficient documentation

## 2014-02-05 DIAGNOSIS — J449 Chronic obstructive pulmonary disease, unspecified: Secondary | ICD-10-CM | POA: Diagnosis not present

## 2014-02-05 DIAGNOSIS — Z8659 Personal history of other mental and behavioral disorders: Secondary | ICD-10-CM | POA: Diagnosis not present

## 2014-02-05 DIAGNOSIS — E119 Type 2 diabetes mellitus without complications: Secondary | ICD-10-CM | POA: Insufficient documentation

## 2014-02-05 DIAGNOSIS — R21 Rash and other nonspecific skin eruption: Secondary | ICD-10-CM | POA: Diagnosis not present

## 2014-02-05 DIAGNOSIS — Z8739 Personal history of other diseases of the musculoskeletal system and connective tissue: Secondary | ICD-10-CM | POA: Diagnosis not present

## 2014-02-05 HISTORY — DX: Fibromyalgia: M79.7

## 2014-02-05 HISTORY — DX: Unspecified asthma, uncomplicated: J45.909

## 2014-02-05 HISTORY — DX: Bipolar disorder, unspecified: F31.9

## 2014-02-05 HISTORY — DX: Chronic obstructive pulmonary disease, unspecified: J44.9

## 2014-02-05 MED ORDER — NAPROXEN 500 MG PO TABS
500.0000 mg | ORAL_TABLET | Freq: Two times a day (BID) | ORAL | Status: DC
Start: 2014-02-05 — End: 2014-03-12

## 2014-02-05 MED ORDER — NAPROXEN 250 MG PO TABS
500.0000 mg | ORAL_TABLET | Freq: Once | ORAL | Status: AC
  Administered 2014-02-05: 500 mg via ORAL
  Filled 2014-02-05: qty 2

## 2014-02-05 NOTE — Discharge Instructions (Signed)
Please call your doctor for a followup appointment within 24-48 hours. When you talk to your doctor please let them know that you were seen in the emergency department and have them acquire all of your records so that they can discuss the findings with you and formulate a treatment plan to fully care for your new and ongoing problems. ° °

## 2014-02-05 NOTE — ED Notes (Signed)
Had a pimple on chin and now area red and hot to touch.

## 2014-02-05 NOTE — ED Provider Notes (Signed)
CSN: [id number]     Arrival date & time 02/05/14  1102 History   First MD Initiated Contact with Patient 02/05/14 1129     Chief Complaint  Patient presents with  . Wound Infection     (Consider location/radiation/quality/duration/timing/severity/associated sxs/prior Treatment) HPI Comments: 63 year old female presents with a rash which has been present for 3 weeks. She has already followed up with her family doctor, she is now taking a cream though she does not know what the name of the cream is, she states that the rash has a burning quality, nothing makes it better, worse with palpation, it has not spread, does not associated with fever nausea or vomiting.  The history is provided by the patient.    Past Medical History  Diagnosis Date  . Diabetes mellitus without complication   . Bipolar 1 disorder   . Fibromyalgia   . COPD (chronic obstructive pulmonary disease)   . CHF (congestive heart failure)   . Asthma    Past Surgical History  Procedure Laterality Date  . Heel spur surgery    . Appendectomy     History reviewed. No pertinent family history. History  Substance Use Topics  . Smoking status: Never Smoker   . Smokeless tobacco: Not on file  . Alcohol Use: No   OB History   Grav Para Term Preterm Abortions TAB SAB Ect Mult Living                 Review of Systems  Constitutional: Negative for fever.  HENT: Negative for mouth sores.   Eyes: Negative for pain and redness.  Musculoskeletal: Negative for arthralgias.  Skin: Positive for rash.      Allergies  Augmentin; Ciprofloxacin; Haldol; Levaquin; and Zoloft  Home Medications   Prior to Admission medications   Medication Sig Start Date End Date Taking? Authorizing Provider  naproxen (NAPROSYN) 500 MG tablet Take 1 tablet (500 mg total) by mouth 2 (two) times daily with a meal. 02/05/14   Johnna Acosta, MD   BP 144/64  Pulse 90  Temp(Src) 98.8 F (37.1 C) (Oral)  Resp 26  Ht 4\' 11"  (1.499 m)   Wt 230 lb (104.327 kg)  BMI 46.43 kg/m2  SpO2 96% Physical Exam  Nursing note and vitals reviewed. Constitutional: She appears well-developed and well-nourished. No distress.  HENT:  Head: Normocephalic and atraumatic.  Eyes: Conjunctivae are normal. Right eye exhibits no discharge. Left eye exhibits no discharge. No scleral icterus.  Cardiovascular: Intact distal pulses.   Pulmonary/Chest: Effort normal.  Neurological: She is alert. Coordination normal.  Skin: Rash noted.  Rash located on the left chin, erythematous, mildly tender, no surrounding erythema induration and no fluctuance, no drainage, no foul-smelling, wound is dry    ED Course  Procedures (including critical care time) Labs Review Labs Reviewed - No data to display  Imaging Review No results found.       MDM   Final diagnoses:  Rash of face    Well-appearing, rash to the chin, not getting worse, anti-inflammatories for discomfort of pain. haqs already been on 2 courses of abx, appearance is not worsening, just wants something for burning pain as it heals.  Meds given in ED:  Medications  naproxen (NAPROSYN) tablet 500 mg (not administered)    New Prescriptions   NAPROXEN (NAPROSYN) 500 MG TABLET    Take 1 tablet (500 mg total) by mouth 2 (two) times daily with a meal.  Johnna Acosta, MD 02/05/14 [phone]

## 2014-02-17 DIAGNOSIS — G894 Chronic pain syndrome: Secondary | ICD-10-CM | POA: Diagnosis not present

## 2014-02-17 DIAGNOSIS — M25569 Pain in unspecified knee: Secondary | ICD-10-CM | POA: Diagnosis not present

## 2014-02-17 DIAGNOSIS — M5412 Radiculopathy, cervical region: Secondary | ICD-10-CM | POA: Diagnosis not present

## 2014-02-17 DIAGNOSIS — M961 Postlaminectomy syndrome, not elsewhere classified: Secondary | ICD-10-CM | POA: Diagnosis not present

## 2014-02-17 DIAGNOSIS — IMO0002 Reserved for concepts with insufficient information to code with codable children: Secondary | ICD-10-CM | POA: Diagnosis not present

## 2014-02-17 DIAGNOSIS — M5137 Other intervertebral disc degeneration, lumbosacral region: Secondary | ICD-10-CM | POA: Diagnosis not present

## 2014-02-17 DIAGNOSIS — G5600 Carpal tunnel syndrome, unspecified upper limb: Secondary | ICD-10-CM | POA: Diagnosis not present

## 2014-02-17 DIAGNOSIS — M47817 Spondylosis without myelopathy or radiculopathy, lumbosacral region: Secondary | ICD-10-CM | POA: Diagnosis not present

## 2014-02-24 DIAGNOSIS — D2330 Other benign neoplasm of skin of unspecified part of face: Secondary | ICD-10-CM | POA: Diagnosis not present

## 2014-02-24 DIAGNOSIS — D485 Neoplasm of uncertain behavior of skin: Secondary | ICD-10-CM | POA: Diagnosis not present

## 2014-02-24 DIAGNOSIS — R209 Unspecified disturbances of skin sensation: Secondary | ICD-10-CM | POA: Diagnosis not present

## 2014-02-25 DIAGNOSIS — L280 Lichen simplex chronicus: Secondary | ICD-10-CM | POA: Diagnosis not present

## 2014-02-25 DIAGNOSIS — R209 Unspecified disturbances of skin sensation: Secondary | ICD-10-CM | POA: Diagnosis not present

## 2014-03-01 DIAGNOSIS — Z7901 Long term (current) use of anticoagulants: Secondary | ICD-10-CM | POA: Diagnosis not present

## 2014-03-01 DIAGNOSIS — I4891 Unspecified atrial fibrillation: Secondary | ICD-10-CM | POA: Diagnosis not present

## 2014-03-01 DIAGNOSIS — Z79899 Other long term (current) drug therapy: Secondary | ICD-10-CM | POA: Diagnosis not present

## 2014-03-07 DIAGNOSIS — Z87891 Personal history of nicotine dependence: Secondary | ICD-10-CM | POA: Diagnosis not present

## 2014-03-07 DIAGNOSIS — J159 Unspecified bacterial pneumonia: Secondary | ICD-10-CM | POA: Diagnosis not present

## 2014-03-10 ENCOUNTER — Ambulatory Visit: Payer: Medicare Other | Admitting: Internal Medicine

## 2014-03-12 ENCOUNTER — Emergency Department (HOSPITAL_COMMUNITY)
Admission: EM | Admit: 2014-03-12 | Discharge: 2014-03-12 | Disposition: A | Discharge status: Home / Self Care | Payer: Medicare Other | Attending: Emergency Medicine | Admitting: Emergency Medicine

## 2014-03-12 ENCOUNTER — Emergency Department (HOSPITAL_COMMUNITY): Payer: Medicare Other

## 2014-03-12 ENCOUNTER — Encounter (HOSPITAL_COMMUNITY): Payer: Self-pay | Admitting: Emergency Medicine

## 2014-03-12 DIAGNOSIS — J189 Pneumonia, unspecified organism: Secondary | ICD-10-CM | POA: Diagnosis not present

## 2014-03-12 DIAGNOSIS — J159 Unspecified bacterial pneumonia: Secondary | ICD-10-CM | POA: Diagnosis not present

## 2014-03-12 DIAGNOSIS — F319 Bipolar disorder, unspecified: Secondary | ICD-10-CM | POA: Diagnosis not present

## 2014-03-12 DIAGNOSIS — I509 Heart failure, unspecified: Secondary | ICD-10-CM | POA: Insufficient documentation

## 2014-03-12 DIAGNOSIS — R079 Chest pain, unspecified: Secondary | ICD-10-CM | POA: Diagnosis not present

## 2014-03-12 DIAGNOSIS — Z79899 Other long term (current) drug therapy: Secondary | ICD-10-CM | POA: Insufficient documentation

## 2014-03-12 DIAGNOSIS — E119 Type 2 diabetes mellitus without complications: Secondary | ICD-10-CM | POA: Insufficient documentation

## 2014-03-12 DIAGNOSIS — J441 Chronic obstructive pulmonary disease with (acute) exacerbation: Secondary | ICD-10-CM | POA: Diagnosis not present

## 2014-03-12 DIAGNOSIS — Z880 Allergy status to penicillin: Secondary | ICD-10-CM | POA: Insufficient documentation

## 2014-03-12 DIAGNOSIS — Z87891 Personal history of nicotine dependence: Secondary | ICD-10-CM | POA: Diagnosis not present

## 2014-03-12 DIAGNOSIS — Z7901 Long term (current) use of anticoagulants: Secondary | ICD-10-CM | POA: Insufficient documentation

## 2014-03-12 DIAGNOSIS — Z792 Long term (current) use of antibiotics: Secondary | ICD-10-CM | POA: Insufficient documentation

## 2014-03-12 DIAGNOSIS — I4891 Unspecified atrial fibrillation: Secondary | ICD-10-CM | POA: Insufficient documentation

## 2014-03-12 DIAGNOSIS — R590 Localized enlarged lymph nodes: Secondary | ICD-10-CM | POA: Diagnosis not present

## 2014-03-12 DIAGNOSIS — Z7982 Long term (current) use of aspirin: Secondary | ICD-10-CM | POA: Insufficient documentation

## 2014-03-12 DIAGNOSIS — J984 Other disorders of lung: Secondary | ICD-10-CM | POA: Diagnosis not present

## 2014-03-12 DIAGNOSIS — I517 Cardiomegaly: Secondary | ICD-10-CM | POA: Diagnosis not present

## 2014-03-12 DIAGNOSIS — J439 Emphysema, unspecified: Secondary | ICD-10-CM | POA: Diagnosis not present

## 2014-03-12 HISTORY — DX: Unspecified atrial fibrillation: I48.91

## 2014-03-12 LAB — HEPATIC FUNCTION PANEL
ALBUMIN: 2.7 g/dL — AB (ref 3.5–5.2)
ALT: 7 U/L (ref 0–35)
AST: 15 U/L (ref 0–37)
Alkaline Phosphatase: 210 U/L — ABNORMAL HIGH (ref 39–117)
BILIRUBIN TOTAL: 0.3 mg/dL (ref 0.3–1.2)
Bilirubin, Direct: <0.2 mg/dL (ref 0.0–0.3)
Total Protein: 7.2 g/dL (ref 6.0–8.3)

## 2014-03-12 LAB — BASIC METABOLIC PANEL
Anion gap: 11 (ref 5–15)
BUN: 19 mg/dL (ref 6–23)
CO2: 38 mEq/L — ABNORMAL HIGH (ref 19–32)
Calcium: 9.2 mg/dL (ref 8.4–10.5)
Chloride: 88 mEq/L — ABNORMAL LOW (ref 96–112)
Creatinine, Ser: 1.09 mg/dL (ref 0.50–1.10)
GFR calc Af Amer: 62 mL/min — ABNORMAL LOW (ref >90)
GFR, EST NON AFRICAN AMERICAN: 53 mL/min — AB (ref >90)
Glucose, Bld: 141 mg/dL — ABNORMAL HIGH (ref 70–99)
Potassium: 3.5 mEq/L — ABNORMAL LOW (ref 3.7–5.3)
Sodium: 137 mEq/L (ref 137–147)

## 2014-03-12 LAB — PRO B NATRIURETIC PEPTIDE: Pro B Natriuretic peptide (BNP): 2272 pg/mL — ABNORMAL HIGH (ref 0–125)

## 2014-03-12 LAB — CBC
HEMATOCRIT: 29.9 % — AB (ref 36.0–46.0)
HEMOGLOBIN: 8.7 g/dL — AB (ref 12.0–15.0)
MCH: 23 pg — ABNORMAL LOW (ref 26.0–34.0)
MCHC: 29.1 g/dL — AB (ref 30.0–36.0)
MCV: 78.9 fL (ref 78.0–100.0)
Platelets: 561 10*3/uL — ABNORMAL HIGH (ref 150–400)
RBC: 3.79 MIL/uL — ABNORMAL LOW (ref 3.87–5.11)
RDW: 15 % (ref 11.5–15.5)
WBC: 13.2 10*3/uL — ABNORMAL HIGH (ref 4.0–10.5)

## 2014-03-12 LAB — PROTIME-INR
INR: 1.78 — ABNORMAL HIGH (ref 0.00–1.49)
PROTHROMBIN TIME: 20.7 s — AB (ref 11.6–15.2)

## 2014-03-12 LAB — TROPONIN I
Troponin I: <0.3 ng/mL (ref <0.30)
Troponin I: <0.3 ng/mL (ref <0.30)

## 2014-03-12 MED ORDER — GI COCKTAIL ~~LOC~~
30.0000 mL | Freq: Once | ORAL | Status: DC
  Filled 2014-03-12: qty 30

## 2014-03-12 MED ORDER — DOXYCYCLINE HYCLATE 100 MG IV SOLR
100.0000 mg | Freq: Once | INTRAVENOUS | Status: AC
  Administered 2014-03-12: 100 mg via INTRAVENOUS
  Filled 2014-03-12: qty 100

## 2014-03-12 MED ORDER — DOXYCYCLINE HYCLATE 100 MG PO CAPS: 100.0000 mg | ORAL_CAPSULE | Freq: Two times a day (BID) | ORAL | Status: DC

## 2014-03-12 MED ORDER — IOHEXOL 300 MG/ML  SOLN
80.0000 mL | Freq: Once | INTRAMUSCULAR | Status: AC | PRN
  Administered 2014-03-12: 80 mL via INTRAVENOUS

## 2014-03-12 MED ORDER — GI COCKTAIL ~~LOC~~: 30.0000 mL | Freq: Once | ORAL | Status: DC

## 2014-03-12 NOTE — Discharge Instructions (Signed)
Follow up next week for recheck °

## 2014-03-12 NOTE — ED Provider Notes (Signed)
CSN: [id number]     Arrival date & time 03/12/14  [id number] History   First MD Initiated Contact with Patient 03/12/14 0902     Chief Complaint  Patient presents with  . Chest Pain     (Consider location/radiation/quality/duration/timing/severity/associated sxs/prior Treatment) Patient is a 63 y.o. female presenting with chest pain. The history is provided by the patient (the pt complains of chest pain).  Chest Pain Pain location:  L chest Pain quality: aching   Pain radiates to:  Does not radiate Pain radiates to the back: no   Pain severity:  Mild Onset quality:  Gradual Timing:  Intermittent Associated symptoms: shortness of breath   Associated symptoms: no abdominal pain, no back pain, no cough, no fatigue and no headache     Past Medical History  Diagnosis Date  . Diabetes mellitus without complication   . Bipolar 1 disorder   . Fibromyalgia   . COPD (chronic obstructive pulmonary disease)   . CHF (congestive heart failure)   . Asthma   . Atrial fibrillation    Past Surgical History  Procedure Laterality Date  . Heel spur surgery    . Appendectomy    . Hernia repair     No family history on file. History  Substance Use Topics  . Smoking status: Former Research scientist (life sciences)  . Smokeless tobacco: Not on file  . Alcohol Use: No   OB History   Grav Para Term Preterm Abortions TAB SAB Ect Mult Living                 Review of Systems  Constitutional: Negative for appetite change and fatigue.  HENT: Negative for congestion, ear discharge and sinus pressure.   Eyes: Negative for discharge.  Respiratory: Positive for shortness of breath. Negative for cough.   Cardiovascular: Positive for chest pain.  Gastrointestinal: Negative for abdominal pain and diarrhea.  Genitourinary: Negative for frequency and hematuria.  Musculoskeletal: Negative for back pain.  Skin: Negative for rash.  Neurological: Negative for seizures and headaches.  Psychiatric/Behavioral: Negative for  hallucinations.      Allergies  Augmentin; Ciprofloxacin; Haldol; Levaquin; and Zoloft  Home Medications   Prior to Admission medications   Medication Sig Start Date End Date Taking? Authorizing Provider  aspirin EC 81 MG tablet Take 81 mg by mouth daily.   Yes Historical Provider, MD  cetirizine (ZYRTEC) 10 MG tablet Take 10 mg by mouth daily.   Yes Historical Provider, MD  desloratadine (CLARINEX) 5 MG tablet Take 5 mg by mouth daily.   Yes Historical Provider, MD  diltiazem (CARDIZEM) 30 MG tablet Take 30 mg by mouth 2 (two) times daily.   Yes Historical Provider, MD  docusate sodium (COLACE) 100 MG capsule Take 100 mg by mouth daily.   Yes Historical Provider, MD  famotidine (PEPCID) 40 MG tablet Take 40 mg by mouth 2 (two) times daily.   Yes Historical Provider, MD  fenofibrate 54 MG tablet Take 54 mg by mouth daily.   Yes Historical Provider, MD  formoterol (PERFOROMIST) 20 MCG/2ML nebulizer solution Take 20 mcg by nebulization 2 (two) times daily.   Yes Historical Provider, MD  furosemide (LASIX) 40 MG tablet Take 40 mg by mouth 2 (two) times daily.   Yes Historical Provider, MD  gabapentin (NEURONTIN) 300 MG capsule Take 900 mg by mouth 3 (three) times daily.   Yes Historical Provider, MD  ipratropium (ATROVENT) 0.03 % nasal spray Place 2 sprays into both nostrils 2 (two) times daily.  Yes Historical Provider, MD  metolazone (ZAROXOLYN) 2.5 MG tablet Take 2.5 mg by mouth 3 (three) times a week. Takes on Monday, Wed, and Fri   Yes Historical Provider, MD  mirtazapine (REMERON) 15 MG tablet Take 15 mg by mouth at bedtime.   Yes Historical Provider, MD  modafinil (PROVIGIL) 100 MG tablet Take 100 mg by mouth daily.   Yes Historical Provider, MD  nitroGLYCERIN (NITROSTAT) 0.4 MG SL tablet Place 0.4 mg under the tongue every 5 (five) minutes as needed for chest pain.   Yes Historical Provider, MD  nystatin (MYCOSTATIN/NYSTOP) 100000 UNIT/GM POWD Apply topically 2 (two) times daily.    Yes Historical Provider, MD  OXYGEN Inhale into the lungs continuous. 3 liters 24/7   Yes Historical Provider, MD  potassium chloride SA (K-DUR,KLOR-CON) 20 MEQ tablet Take 20 mEq by mouth daily.   Yes Historical Provider, MD  pregabalin (LYRICA) 50 MG capsule Take 50 mg by mouth 3 (three) times daily.   Yes Historical Provider, MD  rosuvastatin (CRESTOR) 20 MG tablet Take 20 mg by mouth daily.   Yes Historical Provider, MD  sitaGLIPtin (JANUVIA) 100 MG tablet Take 100 mg by mouth daily.   Yes Historical Provider, MD  warfarin (COUMADIN) 2.5 MG tablet Take 2.5-5 mg by mouth See admin instructions. 2 pills on Monday and Friday, 1 all other days.   Yes Historical Provider, MD  doxycycline (VIBRAMYCIN) 100 MG capsule Take 1 capsule (100 mg total) by mouth 2 (two) times daily. One po bid x 7 days 03/12/14   Maudry Diego, MD   BP 131/73  Pulse 100  Temp(Src) 98.6 F (37 C) (Oral)  Resp 16  SpO2 96% Physical Exam  Constitutional: She is oriented to person, place, and time. She appears well-developed.  HENT:  Head: Normocephalic.  Eyes: Conjunctivae and EOM are normal. No scleral icterus.  Neck: Neck supple. No thyromegaly present.  Cardiovascular: Normal rate and regular rhythm.  Exam reveals no gallop and no friction rub.   No murmur heard. Pulmonary/Chest: No stridor. She has wheezes. She has no rales. She exhibits no tenderness.  Abdominal: She exhibits no distension. There is no tenderness. There is no rebound.  Musculoskeletal: Normal range of motion. She exhibits no edema.  Lymphadenopathy:    She has no cervical adenopathy.  Neurological: She is oriented to person, place, and time. She exhibits normal muscle tone. Coordination normal.  Skin: No rash noted. No erythema.  Psychiatric: She has a normal mood and affect. Her behavior is normal.    ED Course  Procedures (including critical care time) Labs Review Labs Reviewed  CBC - Abnormal; Notable for the following:    WBC 13.2  (*)    RBC 3.79 (*)    Hemoglobin 8.7 (*)    HCT 29.9 (*)    MCH 23.0 (*)    MCHC 29.1 (*)    Platelets 561 (*)    All other components within normal limits  BASIC METABOLIC PANEL - Abnormal; Notable for the following:    Potassium 3.5 (*)    Chloride 88 (*)    CO2 38 (*)    Glucose, Bld 141 (*)    GFR calc non Af Amer 53 (*)    GFR calc Af Amer 62 (*)    All other components within normal limits  PROTIME-INR - Abnormal; Notable for the following:    Prothrombin Time 20.7 (*)    INR 1.78 (*)    All other components within normal  limits  HEPATIC FUNCTION PANEL - Abnormal; Notable for the following:    Albumin 2.7 (*)    Alkaline Phosphatase 210 (*)    All other components within normal limits  PRO B NATRIURETIC PEPTIDE - Abnormal; Notable for the following:    Pro B Natriuretic peptide (BNP) 2272.0 (*)    All other components within normal limits  TROPONIN I  TROPONIN I    Imaging Review Ct Chest W Contrast  03/12/2014   CLINICAL DATA:  Chest pain.  Abnormal chest at x-ray.  EXAM: CT CHEST WITH CONTRAST  TECHNIQUE: Multidetector CT imaging of the chest was performed during intravenous contrast administration.  CONTRAST:  2mL OMNIPAQUE IOHEXOL 300 MG/ML  SOLN  COMPARISON:  One-view chest 03/12/2014.  FINDINGS: The heart is mildly enlarged. The right atrium in particular is enlarged. No significant pleural or pericardial effusion is present.  Sub cm mediastinal lymph nodes are present. There no pathologically enlarged nodes. A sub cm nodules present within both lobes of the thyroid. No significant axillary adenopathy is present.  Limited imaging of the upper abdomen is unremarkable.  Patchy airspace opacification is scattered throughout the right lung. The area identified on the chest x-ray is consolidative peripheral disease in the inferior aspect of the right upper lobe. Less prominent scattered left-sided airspace disease is noted.  The bone windows are unremarkable.  IMPRESSION:  1. Scattered nodular airspace disease throughout the right lung and to a lesser extent on the left. This likely represents infection. The appearance is not typical of neoplasm. 2. Sub cm mediastinal lymph nodes are likely reactive. 3. Borderline cardiomegaly without failure. 4. Recommend follow-up chest x-ray to assure clearing of the disease.   Electronically Signed   By: Lawrence Santiago M.D.   On: 03/12/2014 12:12   Dg Chest Port 1 View  03/12/2014   CLINICAL DATA:  Chest pain, acute  EXAM: PORTABLE CHEST - 1 VIEW  COMPARISON:  None.  FINDINGS: There is underlying emphysematous change. There is a focal area of opacity in the periphery of the right mid lung suspicious for a small focus of pneumonia. Elsewhere lungs are clear. Heart is enlarged with pulmonary vascularity within normal limits. There is postoperative change in the lower cervical spine.  IMPRESSION: Area of presumed infiltrate in the right mid lung peripherally. Followup images to clearing advised to exclude possibility of underlying neoplasm in this area given absence of prior studies to compare. Underlying emphysema. Heart enlarged. Postoperative change lower cervical spine.   Electronically Signed   By: Lowella Grip M.D.   On: 03/12/2014 09:43     EKG Interpretation   Date/Time:  Saturday March 12 2014 09:08:05 EDT Ventricular Rate:  85 PR Interval:    QRS Duration: 102 QT Interval:  405 QTC Calculation: 482 R Axis:   -73 Text Interpretation:  Atrial fibrillation Left anterior fascicular block  Anterior infarct, old Confirmed by Xian Alves  MD, Jonte Wollam [phone]) on  03/12/2014 2:14:33 PM      MDM   Final diagnoses:  Community acquired pneumonia    The pt refuses admission for pneumonia.   Will start doxy,    She has had bactrim,   Zpak,  Amox,   The chart was scribed for me under my direct supervision.  I personally performed the history, physical, and medical decision making and all procedures in the evaluation of this  patient.Maudry Diego, MD 03/12/14 [id number]

## 2014-03-12 NOTE — ED Notes (Signed)
Central cp x 2 wks, denies radiation, denies sob.  C/o nausea.

## 2014-04-12 ENCOUNTER — Ambulatory Visit (INDEPENDENT_AMBULATORY_CARE_PROVIDER_SITE_OTHER): Payer: Medicare Other | Admitting: *Deleted

## 2014-04-12 ENCOUNTER — Encounter: Payer: Self-pay | Admitting: Internal Medicine

## 2014-04-12 ENCOUNTER — Ambulatory Visit (INDEPENDENT_AMBULATORY_CARE_PROVIDER_SITE_OTHER): Payer: Medicare Other | Admitting: Internal Medicine

## 2014-04-12 VITALS — BP 120/68 | HR 83 | Ht 59.0 in | Wt 206.9 lb

## 2014-04-12 DIAGNOSIS — I5031 Acute diastolic (congestive) heart failure: Secondary | ICD-10-CM

## 2014-04-12 DIAGNOSIS — F319 Bipolar disorder, unspecified: Secondary | ICD-10-CM

## 2014-04-12 DIAGNOSIS — M797 Fibromyalgia: Secondary | ICD-10-CM

## 2014-04-12 DIAGNOSIS — I482 Chronic atrial fibrillation, unspecified: Secondary | ICD-10-CM

## 2014-04-12 DIAGNOSIS — I4819 Other persistent atrial fibrillation: Secondary | ICD-10-CM

## 2014-04-12 DIAGNOSIS — E119 Type 2 diabetes mellitus without complications: Secondary | ICD-10-CM | POA: Diagnosis not present

## 2014-04-12 DIAGNOSIS — I481 Persistent atrial fibrillation: Secondary | ICD-10-CM

## 2014-04-12 DIAGNOSIS — J438 Other emphysema: Secondary | ICD-10-CM

## 2014-04-12 DIAGNOSIS — I4891 Unspecified atrial fibrillation: Secondary | ICD-10-CM | POA: Insufficient documentation

## 2014-04-12 LAB — POCT INR: INR: 1.5

## 2014-04-12 MED ORDER — DILTIAZEM HCL 30 MG PO TABS: 30.0000 mg | ORAL_TABLET | Freq: Two times a day (BID) | ORAL | Status: DC

## 2014-04-12 MED ORDER — WARFARIN SODIUM 2.5 MG PO TABS: ORAL_TABLET | ORAL | Status: DC

## 2014-04-12 NOTE — Patient Instructions (Addendum)
Your physician wants you to follow-up in: 6 months with [name]. You will receive a reminder letter in the mail two months in advance. If you don't receive a letter, please call our office to schedule the follow-up appointment.  WARFARIN DOSING (with 2.5mg  tablets) >> TODAY Nov 10 - take 1 and 1/2 tablets  >> On Mondays - take 1/2 tablets >> All other days - take 1 whole tablet  Please schedule INR check with Erasmo Downer in 2 weeks

## 2014-04-13 ENCOUNTER — Institutional Professional Consult (permissible substitution): Payer: Medicare Other | Admitting: Internal Medicine

## 2014-04-14 ENCOUNTER — Encounter: Payer: Self-pay | Admitting: Internal Medicine

## 2014-04-14 DIAGNOSIS — R0600 Dyspnea, unspecified: Secondary | ICD-10-CM | POA: Insufficient documentation

## 2014-04-14 DIAGNOSIS — E1365 Other specified diabetes mellitus with hyperglycemia: Secondary | ICD-10-CM

## 2014-04-14 DIAGNOSIS — E1322 Other specified diabetes mellitus with diabetic chronic kidney disease: Secondary | ICD-10-CM | POA: Insufficient documentation

## 2014-04-14 DIAGNOSIS — N183 Chronic kidney disease, stage 3 unspecified: Secondary | ICD-10-CM | POA: Insufficient documentation

## 2014-04-14 DIAGNOSIS — I5043 Acute on chronic combined systolic (congestive) and diastolic (congestive) heart failure: Secondary | ICD-10-CM | POA: Insufficient documentation

## 2014-04-14 DIAGNOSIS — IMO0002 Reserved for concepts with insufficient information to code with codable children: Secondary | ICD-10-CM | POA: Insufficient documentation

## 2014-04-14 DIAGNOSIS — R0609 Other forms of dyspnea: Secondary | ICD-10-CM

## 2014-04-14 DIAGNOSIS — F319 Bipolar disorder, unspecified: Secondary | ICD-10-CM | POA: Insufficient documentation

## 2014-04-14 DIAGNOSIS — M797 Fibromyalgia: Secondary | ICD-10-CM | POA: Insufficient documentation

## 2014-04-14 NOTE — Progress Notes (Signed)
OFFICE NOTE  Chief Complaint:  Establish new cardiologist  Primary Care Physician: No PCP Per Patient  HPI:  Denise Freeman is a pleasant 63 year old female who is establishing cardiac care today. She is accompanied by her husband and they recently moved here from [place], [place]. She has a history of severe COPD on home oxygen. She also has a history of stress-induced cardiomyopathy in the past. She has since had recovery of her EF. She's had numerous cardiac catheterizations. None of which showed obstructive coronary disease. Her last cardiac catheterization was in October 2014 which demonstrated no significant coronary disease. This was after a small abnormality was noted in the apex suggestive of ischemia on a nuclear stress test. She does have a history of permanent atrial fibrillation on Coumadin. She will need to have her INRs followed here. Unfortunate she has not had her INR checked in over 9 weeks and it was assessed today and was low. We will need to adjust her medication. She will be established in our anticoagulation clinic. Blood pressure looks well controlled today. She is on cholesterol medication.  PMHx:  Past Medical History  Diagnosis Date  . Diabetes mellitus without complication   . Bipolar 1 disorder   . Fibromyalgia   . COPD (chronic obstructive pulmonary disease)   . CHF (congestive heart failure)   . Asthma   . Atrial fibrillation     Past Surgical History  Procedure Laterality Date  . Heel spur surgery    . Appendectomy    . Hernia repair      FAMHx:  Family History  Problem Relation Age of Onset  . Heart disease Mother   . COPD Mother   . Cancer Mother     Breast cancer  . Diabetes Mother   . Heart disease Father   . COPD Sister   . Heart disease Sister   . Heart disease Maternal Grandmother     SOCHx:   reports that she quit smoking about 28 years ago. She does not have any smokeless tobacco history on file. She reports that she does not  drink alcohol or use illicit drugs.  ALLERGIES:  Allergies  Allergen Reactions  . Ancef [Cefazolin]   . Augmentin [Amoxicillin-Pot Clavulanate]   . Ciprofloxacin   . Haldol [Haloperidol]   . Levaquin [Levofloxacin In D5w]   . Tamiflu [Oseltamivir Phosphate]   . Zoloft [Sertraline Hcl]     ROS: A comprehensive review of systems was negative except for: Respiratory: positive for dyspnea on exertion Musculoskeletal: positive for myalgias  HOME MEDS: Current Outpatient Prescriptions  Medication Sig Dispense Refill  . aspirin EC 81 MG tablet Take 81 mg by mouth daily.    . cetirizine (ZYRTEC) 10 MG tablet Take 10 mg by mouth daily.    Marland Kitchen diltiazem (CARDIZEM) 30 MG tablet Take 1 tablet (30 mg total) by mouth 2 (two) times daily. 180 tablet 1  . docusate sodium (COLACE) 100 MG capsule Take 100 mg by mouth daily.    Marland Kitchen escitalopram (LEXAPRO) 10 MG tablet Take 10 mg by mouth daily.    . famotidine (PEPCID) 40 MG tablet Take 40 mg by mouth 2 (two) times daily.    . fenofibrate 54 MG tablet Take 54 mg by mouth daily.    . fluticasone (FLONASE) 50 MCG/ACT nasal spray Place 1 spray into both nostrils 2 (two) times daily.    . formoterol (PERFOROMIST) 20 MCG/2ML nebulizer solution Take 20 mcg by nebulization 2 (two) times  daily.    . furosemide (LASIX) 40 MG tablet Take 40 mg by mouth 2 (two) times daily.    Marland Kitchen gabapentin (NEURONTIN) 300 MG capsule Take 900 mg by mouth 3 (three) times daily.    Marland Kitchen HYDROmorphone (DILAUDID) 2 MG tablet Take 2 mg by mouth every 8 (eight) hours as needed for severe pain.    Marland Kitchen ipratropium (ATROVENT) 0.02 % nebulizer solution Take 0.5 mg by nebulization 2 (two) times daily.    Marland Kitchen ipratropium (ATROVENT) 0.03 % nasal spray Place 2 sprays into both nostrils 2 (two) times daily.    . metolazone (ZAROXOLYN) 2.5 MG tablet Take 2.5 mg by mouth 3 (three) times a week. Takes on Monday, Wed, and Fri    . mirtazapine (REMERON) 15 MG tablet Take 15 mg by mouth at bedtime.    .  modafinil (PROVIGIL) 100 MG tablet Take 100 mg by mouth daily.    . nitroGLYCERIN (NITROSTAT) 0.4 MG SL tablet Place 0.4 mg under the tongue every 5 (five) minutes as needed for chest pain.    Marland Kitchen nystatin (MYCOSTATIN/NYSTOP) 100000 UNIT/GM POWD Apply topically 2 (two) times daily.    . ondansetron (ZOFRAN-ODT) 4 MG disintegrating tablet Take 4 mg by mouth every 8 (eight) hours as needed for nausea or vomiting.    . OXYGEN Inhale into the lungs continuous. 3 liters 24/7    . potassium chloride SA (K-DUR,KLOR-CON) 20 MEQ tablet Take 20 mEq by mouth daily.    . pregabalin (LYRICA) 50 MG capsule Take 50 mg by mouth 3 (three) times daily.    . rosuvastatin (CRESTOR) 20 MG tablet Take 20 mg by mouth daily.    . sitaGLIPtin (JANUVIA) 100 MG tablet Take 100 mg by mouth daily.    Marland Kitchen warfarin (COUMADIN) 2.5 MG tablet Take as directed by mouth daily per INR 100 tablet 0   No current facility-administered medications for this visit.    LABS/IMAGING: No results found for this or any previous visit (from the past 48 hour(s)). No results found.  VITALS: BP 120/68 mmHg  Pulse 83  Ht 4\' 11"  (1.499 m)  Wt 206 lb 14.4 oz (93.849 kg)  BMI 41.77 kg/m2  EXAM: General appearance: alert, appears older than stated age and no distress Neck: no carotid bruit and no JVD Lungs: diminished breath sounds bilaterally and wheezes bilaterally Heart: regular rate and rhythm, S1, S2 normal, no murmur, click, rub or gallop Abdomen: soft, non-tender; bowel sounds normal; no masses,  no organomegaly and obese Extremities: extremities normal, atraumatic, no cyanosis or edema Pulses: 2+ and symmetric Skin: pale, cool, dryt Neurologic: Grossly normal Psych: Somewhat manic  EKG: Atrial fibrillation with controlled ventricular response at 83  ASSESSMENT: 1. Permanent atrial fibrillation on warfarin 2. History of stress-induced cardiomyopathy with normalization of her EF 3. No significant obstructive coronary disease  after multiple catheterizations in 1996, 99, 2007 and 2014. 4. Fibromyalgia 5. Bipolar 1 disorder 6. Dyslipidemia 7. Hypertension 8. Severe COPD on home oxygen 9. Obstructive sleep apnea on CPAP  PLAN: 1.   Ms. Schmall has a number of medical problems, however it would seem that coronary artery disease is not one of them. She does have permanent atrial fibrillation now on warfarin. Her right arm was low and bleed to be adjusted. Her cholesterol is at goal based on recent laboratory work and her blood pressure is controlled. She is scheduled to see a pulmonologist and have follow-up of her sleep apnea. For now I would continue her current  medications and will plan to see her back in 6 months.  Pixie Casino, MD, [facility] Attending Cardiologist [facility]  Yazeed Pryer C 04/14/2014, 10:46 AM

## 2014-04-19 DIAGNOSIS — F321 Major depressive disorder, single episode, moderate: Secondary | ICD-10-CM | POA: Diagnosis not present

## 2014-04-19 DIAGNOSIS — I482 Chronic atrial fibrillation: Secondary | ICD-10-CM | POA: Diagnosis not present

## 2014-04-19 DIAGNOSIS — G8929 Other chronic pain: Secondary | ICD-10-CM | POA: Diagnosis not present

## 2014-04-19 DIAGNOSIS — Z5181 Encounter for therapeutic drug level monitoring: Secondary | ICD-10-CM | POA: Diagnosis not present

## 2014-04-19 DIAGNOSIS — I5022 Chronic systolic (congestive) heart failure: Secondary | ICD-10-CM | POA: Diagnosis not present

## 2014-04-19 DIAGNOSIS — E785 Hyperlipidemia, unspecified: Secondary | ICD-10-CM | POA: Diagnosis not present

## 2014-04-20 ENCOUNTER — Ambulatory Visit (INDEPENDENT_AMBULATORY_CARE_PROVIDER_SITE_OTHER)
Admission: RE | Admit: 2014-04-20 | Discharge: 2014-04-20 | Disposition: A | Discharge status: Home / Self Care | Payer: Medicare Other | Source: Ambulatory Visit | Attending: Internal Medicine | Admitting: Internal Medicine

## 2014-04-20 ENCOUNTER — Ambulatory Visit (INDEPENDENT_AMBULATORY_CARE_PROVIDER_SITE_OTHER): Payer: Medicare Other | Admitting: Internal Medicine

## 2014-04-20 ENCOUNTER — Encounter: Payer: Self-pay | Admitting: Internal Medicine

## 2014-04-20 VITALS — BP 102/60 | HR 98 | Ht 59.0 in | Wt 215.0 lb

## 2014-04-20 DIAGNOSIS — J449 Chronic obstructive pulmonary disease, unspecified: Secondary | ICD-10-CM

## 2014-04-20 DIAGNOSIS — D509 Iron deficiency anemia, unspecified: Secondary | ICD-10-CM

## 2014-04-20 DIAGNOSIS — J189 Pneumonia, unspecified organism: Secondary | ICD-10-CM | POA: Diagnosis not present

## 2014-04-20 DIAGNOSIS — J9612 Chronic respiratory failure with hypercapnia: Secondary | ICD-10-CM

## 2014-04-20 DIAGNOSIS — R195 Other fecal abnormalities: Secondary | ICD-10-CM | POA: Diagnosis not present

## 2014-04-20 MED ORDER — UMECLIDINIUM-VILANTEROL 62.5-25 MCG/INH IN AEPB: 2.0000 | INHALATION_SPRAY | Freq: Once | RESPIRATORY_TRACT | Status: DC

## 2014-04-20 MED ORDER — ANORO ELLIPTA 62.5-25 MCG/INH IN AEPB: 2.0000 | INHALATION_SPRAY | Freq: Once | RESPIRATORY_TRACT | Status: DC

## 2014-04-20 NOTE — Progress Notes (Signed)
Subjective:     Patient ID: Denise Freeman, female   DOB: 1950/11/23,   MRN: [id number]  HPI   43 yowf quit smoking in 1980s with slowly progressive doe assoc with wt gain (not sure how much)  and became 02 dep around 2007 and then scooter dep x 2011 under the care of pulmonary doctor in [place] but moved to [facility] November 2015 and self referred to pulmonary clinic 04/20/2014 for eval of copd/osa     04/20/2014 1st [facility] office visit/ Wert   Chief Complaint  Patient presents with  . Pulmonary Consult    Self referral.  Pt states dxed with COPD approx 12 yrs ago. She c/o DOE with walking approx 5 ft.    onset gradual doe s tendency to aecopd x "years" to point where can't walk across a room s giving out, even on 02  No obvious day to day or daytime variabilty or assoc chronic cough or cp or chest tightness, subjective wheeze overt sinus or hb symptoms. No unusual exp hx or h/o childhood pna/ asthma or knowledge of premature birth.  Sleeping ok without nocturnal  or early am exacerbation  of respiratory  c/o's or need for noct saba. Also denies any obvious fluctuation of symptoms with weather or environmental changes or other aggravating or alleviating factors except as outlined above   Current Medications, Allergies, Complete Past Medical History, Past Surgical History, Family History, and Social History were reviewed in [employer] record.  ROS  The following are not active complaints unless bolded sore throat, dysphagia, dental problems, itching, sneezing,  nasal congestion or excess/ purulent secretions, ear ache,   fever, chills, sweats, unintended wt loss, pleuritic or exertional cp, hemoptysis,  orthopnea pnd or leg swelling, presyncope, palpitations, heartburn, abdominal pain, anorexia, nausea, vomiting, diarrhea  or change in bowel or urinary habits, change in stools or urine, dysuria,hematuria,  rash, arthralgias, visual complaints, headache,  numbness weakness or ataxia or problems with walking or coordination,  change in mood/affect or memory.       Review of Systems     Objective:   Physical Exam    wc bound anxious wf fumbling with pages and pages of doctors names and instructions   Wt Readings from Last 3 Encounters:  04/20/14 215 lb (97.523 kg)  04/12/14 206 lb 14.4 oz (93.849 kg)  02/05/14 230 lb (104.327 kg)    Vital signs reviewed  HEENT mild turbinate edema.  Oropharynx no thrush or excess pnd or cobblestoning.  No JVD or cervical adenopathy. Mild accessory muscle hypertrophy. Trachea midline, nl thryroid. Chest was hyperinflated by percussion with diminished breath sounds and moderate increased exp time without wheeze. Hoover sign positive at mid inspiration. Regular rate and rhythm without murmur gallop or rub or increase P2 or edema.  Abd: no hsm, nl excursion. Ext warm without cyanosis or clubbing.     CXR  04/20/2014 : Right upper lobe pneumonia has improved from previous exam. There are persistent patchy opacities identified within both lungs.    Lab Results  Component Value Date   PROBNP 2272.0* 03/12/2014      Lab Results  Component Value Date   WBC 13.2* 03/12/2014   HGB 8.7* 03/12/2014   HCT 29.9* 03/12/2014   MCV 78.9 03/12/2014   PLT 561* 03/12/2014        Chemistry      Component Value Date/Time   NA 137 03/12/2014 0941   K 3.5* 03/12/2014 0941   CL  88* 03/12/2014 0941   CO2 38* 03/12/2014 0941   BUN 19 03/12/2014 0941   CREATININE 1.09 03/12/2014 0941      Component Value Date/Time   CALCIUM 9.2 03/12/2014 0941   ALKPHOS 210* 03/12/2014 0941   AST 15 03/12/2014 0941   ALT 7 03/12/2014 0941   BILITOT 0.3 03/12/2014 0941       Assessment:

## 2014-04-20 NOTE — Patient Instructions (Addendum)
Stop all your inhalers and theophylline  Plan A = Automatic Start anoro each am and see what it will cost you before it runs out if you like it.  Plan B = Backup  Only use your albuterol/ ipatropium as a rescue medication to be used if you can't catch your breath by resting or doing a relaxed purse lip breathing pattern.  - The less you use it, the better it will work when you need it. - Ok to use up to   every 4 hours if you must but call for immediate appointment if use goes up over your usual need  Please remember to go to the  x-ray department downstairs for your tests - we will call you with the results when they are available.  Please schedule a follow up office visit in  2 weeks, sooner if needed - bring all active medications with you

## 2014-04-21 DIAGNOSIS — J9610 Chronic respiratory failure, unspecified whether with hypoxia or hypercapnia: Secondary | ICD-10-CM | POA: Insufficient documentation

## 2014-04-21 DIAGNOSIS — D509 Iron deficiency anemia, unspecified: Secondary | ICD-10-CM | POA: Insufficient documentation

## 2014-04-21 NOTE — Assessment & Plan Note (Signed)
On bipap per [place] [facility] > f/u Dr Halford Chessman planned

## 2014-04-21 NOTE — Assessment & Plan Note (Addendum)
DDX of  difficult airways management all start with A and  include Adherence, Ace Inhibitors, Acid Reflux, Active Sinus Disease, Alpha 1 Antitripsin deficiency, Anxiety masquerading as Airways dz,  ABPA,  allergy(esp in young), Aspiration (esp in elderly), Adverse effects of DPI,  Active smokers, plus two Bs  = Bronchiectasis and Beta blocker use..and one C= CHF  Adherence is always the initial "prime suspect" and is a multilayered concern that requires a "trust but verify" approach in every patient - starting with knowing how to use medications, especially inhalers, correctly, keeping up with refills and understanding the fundamental difference between maintenance and prns vs those medications only taken for a very short course and then stopped and not refilled.  - extremely poor insight into meds so need to keep things simple - best option is anoro trial at one click each am - The proper method of use, as well as anticipated side effects, of a dry powder  inhaler are discussed and demonstrated to the patient. Improved effectiveness after extensive coaching during this visit to a level of approximately  90%  Anxiety > usually dx of exclusion but near top of the usual list of suspects  Anemia > not a typical "A" but just as important and needs further eval (see sep a/p)  chf > note bnp elevated/ bilateral infiltrates persist so could have element of chf as well > Dr Debara Pickett following so copy of this note to him

## 2014-04-21 NOTE — Progress Notes (Signed)
Quick Note:  Spoke with pt and notified of results per Dr. Wert. Pt verbalized understanding and denied any questions.  ______ 

## 2014-04-21 NOTE — Assessment & Plan Note (Signed)
Defer w/u to primary care

## 2014-04-22 ENCOUNTER — Institutional Professional Consult (permissible substitution): Payer: Medicare Other | Admitting: Internal Medicine

## 2014-04-27 ENCOUNTER — Ambulatory Visit: Payer: Medicare Other | Admitting: Pharmacist Clinician (PhC)/ Clinical Pharmacy Specialist

## 2014-05-04 ENCOUNTER — Telehealth: Payer: Self-pay | Admitting: Pharmacist Clinician (PhC)/ Clinical Pharmacy Specialist

## 2014-05-04 ENCOUNTER — Ambulatory Visit: Payer: Medicare Other | Admitting: Internal Medicine

## 2014-05-04 ENCOUNTER — Ambulatory Visit: Payer: Medicare Other | Admitting: Pharmacist Clinician (PhC)/ Clinical Pharmacy Specialist

## 2014-05-04 NOTE — Telephone Encounter (Signed)
Pt called, LMOM that PCP in [place] Dys MD [facility]  [phone]) will be following her coumadin levels for awhile, as her H/H are low.  Advised pt to call us if Dr. Sandi Mariscal decides to no longer monitor.  Pt voiced understanding

## 2014-05-06 DIAGNOSIS — J069 Acute upper respiratory infection, unspecified: Secondary | ICD-10-CM | POA: Diagnosis not present

## 2014-05-06 DIAGNOSIS — J40 Bronchitis, not specified as acute or chronic: Secondary | ICD-10-CM | POA: Diagnosis not present

## 2014-05-06 DIAGNOSIS — J329 Chronic sinusitis, unspecified: Secondary | ICD-10-CM | POA: Diagnosis not present

## 2014-05-13 ENCOUNTER — Ambulatory Visit (INDEPENDENT_AMBULATORY_CARE_PROVIDER_SITE_OTHER): Payer: Medicare Other | Admitting: Internal Medicine

## 2014-05-13 ENCOUNTER — Encounter: Payer: Self-pay | Admitting: Internal Medicine

## 2014-05-13 VITALS — BP 122/68 | HR 100 | Temp 98.0°F | Resp 16 | Ht 59.0 in | Wt 212.0 lb

## 2014-05-13 DIAGNOSIS — N183 Chronic kidney disease, stage 3 unspecified: Secondary | ICD-10-CM

## 2014-05-13 DIAGNOSIS — E1365 Other specified diabetes mellitus with hyperglycemia: Secondary | ICD-10-CM

## 2014-05-13 DIAGNOSIS — E1322 Other specified diabetes mellitus with diabetic chronic kidney disease: Secondary | ICD-10-CM

## 2014-05-13 DIAGNOSIS — E11319 Type 2 diabetes mellitus with unspecified diabetic retinopathy without macular edema: Secondary | ICD-10-CM | POA: Diagnosis not present

## 2014-05-13 DIAGNOSIS — E1329 Other specified diabetes mellitus with other diabetic kidney complication: Secondary | ICD-10-CM

## 2014-05-13 DIAGNOSIS — IMO0002 Reserved for concepts with insufficient information to code with codable children: Secondary | ICD-10-CM

## 2014-05-13 LAB — HEMOGLOBIN A1C: Hgb A1c MFr Bld: 7.6 % — ABNORMAL HIGH (ref 4.6–6.5)

## 2014-05-13 MED ORDER — INSULIN LISPRO 100 UNIT/ML (KWIKPEN): 16.0000 [IU] | PEN_INJECTOR | Freq: Three times a day (TID) | SUBCUTANEOUS | Status: DC

## 2014-05-13 MED ORDER — INSULIN GLARGINE 300 UNIT/ML ~~LOC~~ SOPN
60.0000 [IU] | PEN_INJECTOR | Freq: Every day | SUBCUTANEOUS | Status: DC
Start: 2014-05-13 — End: 2014-09-13

## 2014-05-13 MED ORDER — INSULIN PEN NEEDLE 32G X 4 MM MISC: Status: DC

## 2014-05-13 NOTE — Patient Instructions (Signed)
Please stop Lantus and start Toujeo 60 units at bedtime. Continue Humalog: 16 units before B 16 units before L 22 units before D Stop Januvia.  Please return in 1 month with your sugar log.   PATIENT INSTRUCTIONS FOR TYPE 2 DIABETES:  **Please join MyChart!** - see attached instructions about how to join if you have not done so already.  DIET AND EXERCISE Diet and exercise is an important part of diabetic treatment.  We recommended aerobic exercise in the form of brisk walking (working between 40-60% of maximal aerobic capacity, similar to brisk walking) for 150 minutes per week (such as 30 minutes five days per week) along with 3 times per week performing 'resistance' training (using various gauge rubber tubes with handles) 5-10 exercises involving the major muscle groups (upper body, lower body and core) performing 10-15 repetitions (or near fatigue) each exercise. Start at half the above goal but build slowly to reach the above goals. If limited by weight, joint pain, or disability, we recommend daily walking in a swimming pool with water up to waist to reduce pressure from joints while allow for adequate exercise.    BLOOD GLUCOSES Monitoring your blood glucoses is important for continued management of your diabetes. Please check your blood glucoses 2-4 times a day: fasting, before meals and at bedtime (you can rotate these measurements - e.g. one day check before the 3 meals, the next day check before 2 of the meals and before bedtime, etc.).   HYPOGLYCEMIA (low blood sugar) Hypoglycemia is usually a reaction to not eating, exercising, or taking too much insulin/ other diabetes drugs.  Symptoms include tremors, sweating, hunger, confusion, headache, etc. Treat IMMEDIATELY with 15 grams of Carbs: . 4 glucose tablets .  cup regular juice/soda . 2 tablespoons raisins . 4 teaspoons sugar . 1 tablespoon honey Recheck blood glucose in 15 mins and repeat above if still symptomatic/blood  glucose <100.  RECOMMENDATIONS TO REDUCE YOUR RISK OF DIABETIC COMPLICATIONS: * Take your prescribed MEDICATION(S) * Follow a DIABETIC diet: Complex carbs, fiber rich foods, (monounsaturated and polyunsaturated) fats * AVOID saturated/trans fats, high fat foods, >2,300 mg salt per day. * EXERCISE at least 5 times a week for 30 minutes or preferably daily.  * DO NOT SMOKE OR DRINK more than 1 drink a day. * Check your FEET every day. Do not wear tightfitting shoes. Contact us if you develop an ulcer * See your EYE doctor once a year or more if needed * Get a FLU shot once a year * Get a PNEUMONIA vaccine once before and once after age 44 years  GOALS:  * Your Hemoglobin A1c of <7%  * fasting sugars need to be <130 * after meals sugars need to be <180 (2h after you start eating) * Your Systolic BP should be [id number] or lower  * Your Diastolic BP should be 80 or lower  * Your HDL (Good Cholesterol) should be 40 or higher  * Your LDL (Bad Cholesterol) should be 100 or lower. * Your Triglycerides should be 150 or lower  * Your Urine microalbumin (kidney function) should be <30 * Your Body Mass Index should be 25 or lower    Please consider the following ways to cut down carbs and fat and increase fiber and micronutrients in your diet: - substitute whole grain for white bread or pasta - substitute brown rice for white rice - substitute 90-calorie flat bread pieces for slices of bread when possible - substitute sweet potatoes or  yams for white potatoes - substitute humus for margarine - substitute tofu for cheese when possible - substitute almond or rice milk for regular milk (would not drink soy milk daily due to concern for soy estrogen influence on breast cancer risk) - substitute dark chocolate for other sweets when possible - substitute water - can add lemon or orange slices for taste - for diet sodas (artificial sweeteners will trick your body that you can eat sweets without getting  calories and will lead you to overeating and weight gain in the long run) - do not skip breakfast or other meals (this will slow down the metabolism and will result in more weight gain over time)  - can try smoothies made from fruit and almond/rice milk in am instead of regular breakfast - can also try old-fashioned (not instant) oatmeal made with almond/rice milk in am - order the dressing on the side when eating salad at a restaurant (pour less than half of the dressing on the salad) - eat as little meat as possible - can try juicing, but should not forget that juicing will get rid of the fiber, so would alternate with eating raw veg./fruits or drinking smoothies - use as little oil as possible, even when using olive oil - can dress a salad with a mix of balsamic vinegar and lemon juice, for e.g. - use agave nectar, stevia sugar, or regular sugar rather than artificial sweateners - steam or broil/roast veggies  - snack on veggies/fruit/nuts (unsalted, preferably) when possible, rather than processed foods - reduce or eliminate aspartame in diet (it is in diet sodas, chewing gum, etc) Read the labels!  Try to read Dr. Janene Harvey book: "Program for Reversing Diabetes" for other ideas for healthy eating.

## 2014-05-13 NOTE — Progress Notes (Signed)
Patient ID: Denise Freeman, female   DOB: September 07, 1950, 63 y.o.   MRN: [id number]  HPI: Denise Freeman is a 63 y.o.-year-old female, referred by her PCP, Dr. Loman Chroman, for management of DM2, dx in 2009, insulin-dependent in 2013, uncontrolled, with complications (CKD, DR).  Last hemoglobin A1c was: 12/30/2013: HbA1c 8.6% 12/28/2013: HbA1c 7.9% HbA1c 8.7% HbA1c 9.4% HbA1c 7.3%  Pt is on a regimen of: - Lantus 50 units qhs pen - HumaLog pen: 16 units before B 16 units before L 22 units before D - Januvia 100 mg daily  Pt checks her sugars 4x a day and they are: - am: 149-170 - 2h after b'fast: n/c - before lunch: 158-199 - 2h after lunch: n/c - before dinner: 169-205, 225 - 2h after dinner: n/c - bedtime: 154-199 - nighttime:n/c No lows. Lowest sugar was 149; she has hypoglycemia awareness at 70 at 120. Highest sugar was 240.   Glucometer: FreeStyle  Pt's meals are: - Breakfast: toast with spread or oatmeal with splenda - Lunch: cottage cheese, or fruit - Dinner: chicken/beef/turkey with vegetables and a starch; macaroni and cheese - Snacks: 2 snacks   - + CKD stage 3, last BUN/creatinine:  Lab Results  Component Value Date   BUN 19 03/12/2014   CREATININE 1.09 03/12/2014  On Lisinopril, fenofibrate - last set of lipids: 09/07/2013: 180/135/31/138 Ob Crestor - last eye exam was in 2014. + DR OU.  - no numbness and tingling in her feet.  Pt has FH of DM in mother, sister. ROS: Constitutional: + weight gain, + fatigue, + hot flushes, + poor sleep, + nocturia Eyes: no blurry vision, no xerophthalmia ENT: no sore throat, no nodules palpated in throat, no dysphagia/odynophagia, + hoarseness, + tinnitus, + hypoacusis Cardiovascular: + CP/+ SOB/+ palpitations/+ leg swelling Respiratory: + cough/+ SOB/+ wheezing Gastrointestinal: + N/no V/+ D/no C, + heartburn Musculoskeletal: + muscle aches/+ joint aches Skin: no rashes, + easy bruising Neurological: no  tremors/numbness/tingling/dizziness Psychiatric: + both: depression/anxiety  Past Medical History  Diagnosis Date  . Diabetes mellitus without complication   . Bipolar 1 disorder   . Fibromyalgia   . COPD (chronic obstructive pulmonary disease)   . CHF (congestive heart failure)   . Asthma   . Atrial fibrillation    Past Surgical History  Procedure Laterality Date  . Heel spur surgery    . Appendectomy    . Hernia repair     History   Social History  . Marital Status: Married    Spouse Name: N/A    Number of Children: 1   Occupational History  . disabled   Social History Main Topics  . Smoking status: Former Smoker -- 2.00 packs/day for 14 years    Types: Cigarettes    Quit date: 04/12/1986  . Smokeless tobacco: Not on file  . Alcohol Use: No  . Drug Use: No   Current Outpatient Prescriptions on File Prior to Visit  Medication Sig Dispense Refill  . ANORO ELLIPTA 62.5-25 MCG/INH AEPB Inhale 2 puffs into the lungs once. Only open the device one time and then take your two separate drags to be sure you get it all 60 each 11  . aspirin EC 81 MG tablet Take 81 mg by mouth daily.    . cetirizine (ZYRTEC) 10 MG tablet Take 10 mg by mouth daily.    Marland Kitchen diltiazem (CARDIZEM) 30 MG tablet Take 1 tablet (30 mg total) by mouth 2 (two) times daily. 180 tablet 1  . docusate sodium (  COLACE) 100 MG capsule Take 100 mg by mouth daily.    Marland Kitchen escitalopram (LEXAPRO) 10 MG tablet Take 10 mg by mouth daily.    . famotidine (PEPCID) 40 MG tablet Take 40 mg by mouth 2 (two) times daily.    . fenofibrate 54 MG tablet Take 54 mg by mouth daily.    . fluticasone (FLONASE) 50 MCG/ACT nasal spray Place 1 spray into both nostrils 2 (two) times daily.    . furosemide (LASIX) 40 MG tablet Take 40 mg by mouth 2 (two) times daily.    Marland Kitchen gabapentin (NEURONTIN) 300 MG capsule Take 900 mg by mouth 3 (three) times daily.    Marland Kitchen HYDROmorphone (DILAUDID) 2 MG tablet Take 2 mg by mouth every 8 (eight) hours as  needed for severe pain.    Marland Kitchen ipratropium (ATROVENT) 0.02 % nebulizer solution Take 0.5 mg by nebulization 2 (two) times daily.    Marland Kitchen ipratropium (ATROVENT) 0.03 % nasal spray Place 2 sprays into both nostrils 2 (two) times daily.    . metolazone (ZAROXOLYN) 2.5 MG tablet Take 2.5 mg by mouth 3 (three) times a week. Takes on Monday, Wed, and Fri    . mirtazapine (REMERON) 15 MG tablet Take 15 mg by mouth at bedtime.    . modafinil (PROVIGIL) 100 MG tablet Take 100 mg by mouth daily.    . nitroGLYCERIN (NITROSTAT) 0.4 MG SL tablet Place 0.4 mg under the tongue every 5 (five) minutes as needed for chest pain.    Marland Kitchen nystatin (MYCOSTATIN/NYSTOP) 100000 UNIT/GM POWD Apply topically 2 (two) times daily.    . ondansetron (ZOFRAN-ODT) 4 MG disintegrating tablet Take 4 mg by mouth every 8 (eight) hours as needed for nausea or vomiting.    . OXYGEN Inhale into the lungs continuous. 3 liters 24/7    . potassium chloride SA (K-DUR,KLOR-CON) 20 MEQ tablet Take 20 mEq by mouth daily.    . pregabalin (LYRICA) 50 MG capsule Take 50 mg by mouth 3 (three) times daily.    . rosuvastatin (CRESTOR) 20 MG tablet Take 20 mg by mouth daily.    . sitaGLIPtin (JANUVIA) 100 MG tablet Take 100 mg by mouth daily.    Marland Kitchen warfarin (COUMADIN) 2.5 MG tablet Take as directed by mouth daily per INR 100 tablet 0   No current facility-administered medications on file prior to visit.   Allergies  Allergen Reactions  . Ancef [Cefazolin]   . Augmentin [Amoxicillin-Pot Clavulanate]   . Ciprofloxacin   . Haldol [Haloperidol]   . Levaquin [Levofloxacin In D5w]   . Tamiflu [Oseltamivir Phosphate]   . Zoloft [Sertraline Hcl]    Family History  Problem Relation Age of Onset  . Heart disease Mother   . COPD Mother   . Cancer Mother     Breast cancer  . Diabetes Mother   . Heart disease Father   . COPD Sister   . Heart disease Sister   . Heart disease Maternal Grandmother    PE: BP 122/68 mmHg  Pulse 100  Temp(Src) 98 F  (36.7 C) (Oral)  Resp 16  Ht 4\' 11"  (1.499 m)  Wt 212 lb (96.163 kg)  BMI 42.80 kg/m2  SpO2 94% Wt Readings from Last 3 Encounters:  05/13/14 212 lb (96.163 kg)  04/20/14 215 lb (97.523 kg)  04/12/14 206 lb 14.4 oz (93.849 kg)   Constitutional: obese, in wheelchair, in NAD, on oxygen Eyes: PERRLA, EOMI, no exophthalmos ENT: moist mucous membranes, no thyromegaly, no cervical lymphadenopathy  Cardiovascular: RRR, No MRG Respiratory: CTA B Gastrointestinal: abdomen soft, NT, ND, BS+ Musculoskeletal: no deformities, strength intact in all 4 Skin: moist, warm, no rashes Neurological: no tremor with outstretched hands, DTR normal in all 4  ASSESSMENT: 1. DM2, insulin-dependent, uncontrolled, with complications - CKD stage 3 - DR  PLAN:  1. Patient with long-standing, uncontrolled diabetes, on basal-bolus with oral antidiabetic regimen (Januvia), which is insufficient, but, per her log, she is not far from goal. - We discussed about options for treatment, and I suggested to increase basal insulin and switch to the more concentrated Toujeo:  Patient Instructions  Please stop Lantus and start Toujeo 60 units at bedtime. Continue Humalog: 16 units before B 16 units before L 22 units before D Stop Januvia. Please return in 1 month with your sugar log.  - Strongly advised her to continue checking sugars at different times of the day - check 4 times a day, rotating checks - given sugar log and advised how to fill it and to bring it at next appt  - given foot care handout and explained the principles  - given instructions for hypoglycemia management "15-15 rule"  - advised for yearly eye exams >> needs one - check HbA1c today - refilled her DM Rx's - Return to clinic in 1 mo with sugar log   Office Visit on 05/13/2014  Component Date Value Ref Range Status  . Hgb A1c MFr Bld 05/13/2014 7.6* 4.6 - 6.5 % Final   Glycemic Control Guidelines for People with Diabetes:Non Diabetic:   <6%Goal of Therapy: <7%Additional Action Suggested:  >8%    Pt's HbA1c not far from goal. See plan above.

## 2014-05-16 DIAGNOSIS — M542 Cervicalgia: Secondary | ICD-10-CM | POA: Diagnosis not present

## 2014-05-16 DIAGNOSIS — M546 Pain in thoracic spine: Secondary | ICD-10-CM | POA: Diagnosis not present

## 2014-05-16 DIAGNOSIS — R202 Paresthesia of skin: Secondary | ICD-10-CM | POA: Diagnosis not present

## 2014-05-16 DIAGNOSIS — M545 Low back pain: Secondary | ICD-10-CM | POA: Diagnosis not present

## 2014-05-16 DIAGNOSIS — M797 Fibromyalgia: Secondary | ICD-10-CM | POA: Diagnosis not present

## 2014-05-16 DIAGNOSIS — G8929 Other chronic pain: Secondary | ICD-10-CM | POA: Diagnosis not present

## 2014-05-17 ENCOUNTER — Encounter: Payer: Self-pay | Admitting: Internal Medicine

## 2014-05-18 ENCOUNTER — Emergency Department (HOSPITAL_COMMUNITY)
Admission: EM | Admit: 2014-05-18 | Discharge: 2014-05-18 | Disposition: A | Discharge status: Home / Self Care | Payer: Medicare Other | Attending: Emergency Medicine | Admitting: Emergency Medicine

## 2014-05-18 ENCOUNTER — Emergency Department (HOSPITAL_COMMUNITY): Payer: Medicare Other

## 2014-05-18 ENCOUNTER — Encounter (HOSPITAL_COMMUNITY): Payer: Self-pay | Admitting: *Deleted

## 2014-05-18 DIAGNOSIS — M797 Fibromyalgia: Secondary | ICD-10-CM | POA: Insufficient documentation

## 2014-05-18 DIAGNOSIS — Z7901 Long term (current) use of anticoagulants: Secondary | ICD-10-CM | POA: Insufficient documentation

## 2014-05-18 DIAGNOSIS — E119 Type 2 diabetes mellitus without complications: Secondary | ICD-10-CM | POA: Insufficient documentation

## 2014-05-18 DIAGNOSIS — G629 Polyneuropathy, unspecified: Secondary | ICD-10-CM | POA: Insufficient documentation

## 2014-05-18 DIAGNOSIS — J209 Acute bronchitis, unspecified: Secondary | ICD-10-CM

## 2014-05-18 DIAGNOSIS — Z794 Long term (current) use of insulin: Secondary | ICD-10-CM | POA: Insufficient documentation

## 2014-05-18 DIAGNOSIS — F319 Bipolar disorder, unspecified: Secondary | ICD-10-CM | POA: Diagnosis not present

## 2014-05-18 DIAGNOSIS — Z87891 Personal history of nicotine dependence: Secondary | ICD-10-CM | POA: Diagnosis not present

## 2014-05-18 DIAGNOSIS — R05 Cough: Secondary | ICD-10-CM | POA: Diagnosis not present

## 2014-05-18 DIAGNOSIS — Z7982 Long term (current) use of aspirin: Secondary | ICD-10-CM | POA: Diagnosis not present

## 2014-05-18 DIAGNOSIS — J449 Chronic obstructive pulmonary disease, unspecified: Secondary | ICD-10-CM | POA: Insufficient documentation

## 2014-05-18 DIAGNOSIS — Z7951 Long term (current) use of inhaled steroids: Secondary | ICD-10-CM | POA: Diagnosis not present

## 2014-05-18 DIAGNOSIS — I4891 Unspecified atrial fibrillation: Secondary | ICD-10-CM | POA: Diagnosis not present

## 2014-05-18 DIAGNOSIS — Z79899 Other long term (current) drug therapy: Secondary | ICD-10-CM | POA: Diagnosis not present

## 2014-05-18 DIAGNOSIS — I509 Heart failure, unspecified: Secondary | ICD-10-CM | POA: Diagnosis not present

## 2014-05-18 MED ORDER — AZITHROMYCIN 250 MG PO TABS: ORAL_TABLET | ORAL | Status: DC

## 2014-05-18 MED ORDER — AZITHROMYCIN 250 MG PO TABS
500.0000 mg | ORAL_TABLET | Freq: Once | ORAL | Status: AC
  Administered 2014-05-18: 500 mg via ORAL
  Filled 2014-05-18: qty 2

## 2014-05-18 NOTE — ED Notes (Signed)
Pt states productive cough, white and thick in color. Pt states she has seen PCP and was told to wait it out. Pt then seen at urgent care and states medication is not working. Pt is on 3L at all times.

## 2014-05-18 NOTE — Discharge Instructions (Signed)
The testing today, is most consistent with bronchitis is the source of your problem.  However, the chest x-ray may indicate that you have a early pneumonia that will require antibiotic treatment.  Continue your usual treatments for your COPD, as recommended by your pulmonologist. Make sure that you see a primary care doctor, for checkup in one or 2 weeks.    Acute Bronchitis Bronchitis is inflammation of the airways that extend from the windpipe into the lungs (bronchi). The inflammation often causes mucus to develop. This leads to a cough, which is the most common symptom of bronchitis.  In acute bronchitis, the condition usually develops suddenly and goes away over time, usually in a couple weeks. Smoking, allergies, and asthma can make bronchitis worse. Repeated episodes of bronchitis may cause further lung problems.  CAUSES Acute bronchitis is most often caused by the same virus that causes a cold. The virus can spread from person to person (contagious) through coughing, sneezing, and touching contaminated objects. SIGNS AND SYMPTOMS   Cough.   Fever.   Coughing up mucus.   Body aches.   Chest congestion.   Chills.   Shortness of breath.   Sore throat.  DIAGNOSIS  Acute bronchitis is usually diagnosed through a physical exam. Your health care provider will also ask you questions about your medical history. Tests, such as chest X-rays, are sometimes done to rule out other conditions.  TREATMENT  Acute bronchitis usually goes away in a couple weeks. Oftentimes, no medical treatment is necessary. Medicines are sometimes given for relief of fever or cough. Antibiotic medicines are usually not needed but may be prescribed in certain situations. In some cases, an inhaler may be recommended to help reduce shortness of breath and control the cough. A cool mist vaporizer may also be used to help thin bronchial secretions and make it easier to clear the chest.  HOME CARE  INSTRUCTIONS  Get plenty of rest.   Drink enough fluids to keep your urine clear or pale yellow (unless you have a medical condition that requires fluid restriction). Increasing fluids may help thin your respiratory secretions (sputum) and reduce chest congestion, and it will prevent dehydration.   Take medicines only as directed by your health care provider.  If you were prescribed an antibiotic medicine, finish it all even if you start to feel better.  Avoid smoking and secondhand smoke. Exposure to cigarette smoke or irritating chemicals will make bronchitis worse. If you are a smoker, consider using nicotine gum or skin patches to help control withdrawal symptoms. Quitting smoking will help your lungs heal faster.   Reduce the chances of another bout of acute bronchitis by washing your hands frequently, avoiding people with cold symptoms, and trying not to touch your hands to your mouth, nose, or eyes.   Keep all follow-up visits as directed by your health care provider.  SEEK MEDICAL CARE IF: Your symptoms do not improve after 1 week of treatment.  SEEK IMMEDIATE MEDICAL CARE IF:  You develop an increased fever or chills.   You have chest pain.   You have severe shortness of breath.  You have bloody sputum.   You develop dehydration.  You faint or repeatedly feel like you are going to pass out.  You develop repeated vomiting.  You develop a severe headache. MAKE SURE YOU:   Understand these instructions.  Will watch your condition.  Will get help right away if you are not doing well or get worse. Document Released: 06/27/2004 Document  Revised: 10/04/2013 Document Reviewed: 11/10/2012 [facility] Patient Information 2015 [place], [place]. This information is not intended to replace advice given to you by your health care provider. Make sure you discuss any questions you have with your health care provider.

## 2014-05-18 NOTE — ED Provider Notes (Signed)
CSN: [id number]     Arrival date & time 05/18/14  1641 History  This chart was scribed for Denise Blade, MD by Rayfield Citizen, ED Scribe. This patient was seen in room APA09/APA09 and the patient's care was started at 5:24 PM.    Chief Complaint  Patient presents with  . Cough   Patient is a 63 y.o. female presenting with cough. The history is provided by the patient. No language interpreter was used.  Cough    HPI Comments: Denise Freeman is a 63 y.o. female with past medical history of COPD, asthma, DM, CHF, afib, fibromyalgia who presents to the Emergency Department complaining of 2-3 weeks of productive cough (thick, white sputum). She visited her PCP several days after symptom onset and was told to wait it out. Several days later (two weeks PTA), patient was given Robitussin and Ceftin by an Urgent Care doc, which she has been taking as prescribed for two weeks without relief.   Dr. Melvyn Novas recently took patient off her regular breathing meds, stating she was "over medicated." Patient reports that she still has a "small stash of these hidden at home." She is on 3L O2 at all times. She reports that she is a former smoker, quit date 79 years PTA.   Past Medical History  Diagnosis Date  . Diabetes mellitus without complication   . Bipolar 1 disorder   . Fibromyalgia   . COPD (chronic obstructive pulmonary disease)   . CHF (congestive heart failure)   . Asthma   . Atrial fibrillation   . Neuropathy   . Fibromyalgia    Past Surgical History  Procedure Laterality Date  . Heel spur surgery    . Appendectomy    . Hernia repair    . Bladder suspension      2003, 2006 and 2010   Family History  Problem Relation Age of Onset  . Heart disease Mother   . COPD Mother   . Cancer Mother     Breast cancer  . Diabetes Mother   . Heart disease Father   . COPD Sister   . Heart disease Sister   . Diabetes Sister   . Heart disease Maternal Grandmother    History  Substance Use Topics  .  Smoking status: Former Smoker -- 2.00 packs/day for 14 years    Types: Cigarettes    Quit date: 04/12/1986  . Smokeless tobacco: Not on file  . Alcohol Use: No   OB History    No data available     Review of Systems  Respiratory: Positive for cough.   All other systems reviewed and are negative.     Allergies  Ancef; Augmentin; Ciprofloxacin; Haldol; Levaquin; Nsaids; Tamiflu; and Zoloft  Home Medications   Prior to Admission medications   Medication Sig Start Date End Date Taking? Authorizing Provider  ANORO ELLIPTA 62.5-25 MCG/INH AEPB Inhale 2 puffs into the lungs once. Only open the device one time and then take your two separate drags to be sure you get it all 04/20/14  Yes Tanda Rockers, MD  aspirin EC 81 MG tablet Take 81 mg by mouth daily.   Yes Historical Provider, MD  cetirizine (ZYRTEC) 10 MG tablet Take 10 mg by mouth daily.   Yes Historical Provider, MD  diltiazem (CARDIZEM) 30 MG tablet Take 1 tablet (30 mg total) by mouth 2 (two) times daily. 04/12/14  Yes Pixie Casino, MD  docusate sodium (COLACE) 100 MG capsule Take 100 mg  by mouth daily.   Yes Historical Provider, MD  escitalopram (LEXAPRO) 10 MG tablet Take 10 mg by mouth daily.   Yes Historical Provider, MD  famotidine (PEPCID) 40 MG tablet Take 40 mg by mouth 2 (two) times daily.   Yes Historical Provider, MD  fenofibrate 54 MG tablet Take 54 mg by mouth daily.   Yes Historical Provider, MD  fluticasone (FLONASE) 50 MCG/ACT nasal spray Place 1 spray into both nostrils 2 (two) times daily.   Yes Historical Provider, MD  furosemide (LASIX) 40 MG tablet Take 40 mg by mouth 2 (two) times daily.   Yes Historical Provider, MD  gabapentin (NEURONTIN) 300 MG capsule Take 900 mg by mouth 3 (three) times daily.    Yes Historical Provider, MD  Insulin Glargine (TOUJEO SOLOSTAR) 300 UNIT/ML SOPN Inject 60 Units into the skin at bedtime. 05/13/14  Yes Philemon Kingdom, MD  insulin lispro (HUMALOG KWIKPEN) 100 UNIT/ML  KiwkPen Inject 0.16-0.22 mLs (16-22 Units total) into the skin 3 (three) times daily. 05/13/14  Yes Philemon Kingdom, MD  ipratropium (ATROVENT) 0.02 % nebulizer solution Take 0.5 mg by nebulization 2 (two) times daily.   Yes Historical Provider, MD  ipratropium (ATROVENT) 0.03 % nasal spray Place 2 sprays into both nostrils 2 (two) times daily.   Yes Historical Provider, MD  metolazone (ZAROXOLYN) 2.5 MG tablet Take 2.5 mg by mouth 3 (three) times a week. Takes on Monday, Wed, and Fri   Yes Historical Provider, MD  mirtazapine (REMERON) 15 MG tablet Take 15 mg by mouth at bedtime.   Yes Historical Provider, MD  modafinil (PROVIGIL) 100 MG tablet Take 200 mg by mouth daily.    Yes Historical Provider, MD  nystatin (MYCOSTATIN/NYSTOP) 100000 UNIT/GM POWD Apply topically 2 (two) times daily.   Yes Historical Provider, MD  oxyCODONE (OXY IR/ROXICODONE) 5 MG immediate release tablet Take 5 mg by mouth 2 (two) times daily.   Yes Historical Provider, MD  OXYGEN Inhale into the lungs continuous. 3 liters 24/7   Yes Historical Provider, MD  potassium chloride SA (K-DUR,KLOR-CON) 20 MEQ tablet Take 20 mEq by mouth daily.   Yes Historical Provider, MD  pregabalin (LYRICA) 50 MG capsule Take 50 mg by mouth 3 (three) times daily.   Yes Historical Provider, MD  rosuvastatin (CRESTOR) 20 MG tablet Take 20 mg by mouth daily.   Yes Historical Provider, MD  warfarin (COUMADIN) 2.5 MG tablet Take as directed by mouth daily per INR Patient taking differently: Take 1.25-2.5 mg by mouth daily. Patient takes 1.25mg  on Monday and Friday and 2.5mg  all other days 04/12/14  Yes Pixie Casino, MD  azithromycin (ZITHROMAX Z-PAK) 250 MG tablet 2 po day one, then 1 daily x 4 days 05/18/14   Denise Blade, MD  Insulin Pen Needle (BD PEN NEEDLE NANO U/F) 32G X 4 MM MISC Use 4x a day Patient not taking: Reported on 05/18/2014 05/13/14   Philemon Kingdom, MD  ondansetron (ZOFRAN-ODT) 4 MG disintegrating tablet Take 4 mg by  mouth every 8 (eight) hours as needed for nausea or vomiting.    Historical Provider, MD   BP 128/71 mmHg  Pulse 102  Temp(Src) 98.3 F (36.8 C) (Oral)  Resp 24  Ht 4\' 11"  (1.499 m)  Wt 213 lb (96.616 kg)  BMI 43.00 kg/m2  SpO2 97% Physical Exam  Constitutional: She is oriented to person, place, and time. She appears well-developed and well-nourished.  HENT:  Head: Normocephalic and atraumatic.  Eyes: Conjunctivae and EOM  are normal. Pupils are equal, round, and reactive to light.  Neck: Normal range of motion and phonation normal. Neck supple.  Cardiovascular: Normal rate and regular rhythm.   Pulmonary/Chest: Effort normal and breath sounds normal. She has no wheezes. She has no rhonchi. She has no rales. She exhibits no tenderness.  Abdominal: Soft. She exhibits no distension. There is no tenderness. There is no guarding.  Musculoskeletal: Normal range of motion.  Neurological: She is alert and oriented to person, place, and time. She exhibits normal muscle tone.  Skin: Skin is warm and dry.  Psychiatric: She has a normal mood and affect. Her behavior is normal. Judgment and thought content normal.  Nursing note and vitals reviewed.   ED Course  Procedures   DIAGNOSTIC STUDIES: Oxygen Saturation is 97% on [place] 3L/min, adequate by my interpretation.    COORDINATION OF CARE:  Medications  azithromycin (ZITHROMAX) tablet 500 mg (500 mg Oral Given 05/18/14 1927)    Patient Vitals for the past 24 hrs:  BP Temp Temp src Pulse Resp SpO2 Height Weight  05/18/14 1647 128/71 mmHg 98.3 F (36.8 C) Oral 102 24 97 % 4\' 11"  (1.499 m) 213 lb (96.616 kg)   5:28 PM Discussed treatment plan with pt at bedside and pt agreed to plan.   7:30 PM Discussed discharge plans (including zithromax). Patient expressed understanding and agreed to plan.   Labs Review Labs Reviewed - No data to display  Imaging Review Dg Chest 2 View  05/18/2014   CLINICAL DATA:  Productive cough  EXAM: CHEST   2 VIEW  COMPARISON:  05/06/2014  FINDINGS: Cardiomegaly again noted. Pleural parenchymal scarring right midlung is stable. Central mild bronchitic changes. There is streaky right basilar atelectasis or early infiltrate. Mild left basilar atelectasis. Mild degenerative changes thoracic spine.  IMPRESSION: Cardiomegaly. Worsening in aeration with streaky right basilar atelectasis or infiltrate. Central mild bronchitic changes.   Electronically Signed   By: Lahoma Crocker M.D.   On: 05/18/2014 19:06     EKG Interpretation  Date/Time:    Ventricular Rate:    PR Interval:    QRS Duration:   QT Interval:    QTC Calculation:   R Axis:     Text Interpretation:        MDM   Final diagnoses:  Acute bronchitis, unspecified organism   COPD exacerbation, without evidence for pneumonia, or suspected metabolic instability.  Patient exhibits medication noncompliance and does not follow the recommendations of her present pulmonologist.  Nursing Notes Reviewed/ Care Coordinated Applicable Imaging Reviewed Interpretation of Laboratory Data incorporated into ED treatment  The patient appears reasonably screened and/or stabilized for discharge and I doubt any other medical condition or other [facility] requiring further screening, evaluation, or treatment in the ED at this time prior to discharge.  Plan: Home Medications- Zithromax; Home Treatments- rest; return here if the recommended treatment, does not improve the symptoms; Recommended follow up- PCP prn    I personally performed the services described in this documentation, which was scribed in my presence. The recorded information has been reviewed and is accurate.       Denise Blade, MD 05/19/14 [phone]

## 2014-05-19 ENCOUNTER — Telehealth: Payer: Self-pay | Admitting: Pulmonary Disease

## 2014-05-19 ENCOUNTER — Institutional Professional Consult (permissible substitution): Payer: Medicare Other | Admitting: Pulmonary Disease

## 2014-05-19 ENCOUNTER — Ambulatory Visit: Payer: Medicare Other | Attending: Anesthesiology

## 2014-05-19 DIAGNOSIS — R293 Abnormal posture: Secondary | ICD-10-CM | POA: Diagnosis not present

## 2014-05-19 DIAGNOSIS — R531 Weakness: Secondary | ICD-10-CM

## 2014-05-19 DIAGNOSIS — M2560 Stiffness of unspecified joint, not elsewhere classified: Secondary | ICD-10-CM | POA: Diagnosis not present

## 2014-05-19 DIAGNOSIS — M545 Low back pain, unspecified: Secondary | ICD-10-CM

## 2014-05-19 NOTE — Telephone Encounter (Signed)
yes

## 2014-05-19 NOTE — Telephone Encounter (Signed)
Spoke with pt, she rescheduled her sleep consult with sood today because she is not feeling well.  She is wanting to switch her pulmonary care from [facility] to another provider.  Dr. Halford Chessman, would you be ok with seeing this patient for pulmonary and sleep?  And Dr. Melvyn Novas are you ok with this pt switching care?  Thanks!

## 2014-05-19 NOTE — Therapy (Addendum)
[facility] [address] [place], , [address] Phone: [phone]   Fax:  [phone]  Physical Therapy Evaluation  Patient Details  Name: Denise Freeman MRN: [id number] Date of Birth: 02/23/1951  Encounter Date: 05/19/2014      PT End of Session - 05/19/14 1405    Visit Number 1   Number of Visits 8   Date for PT Re-Evaluation 06/19/14   PT Start Time 1310   PT Stop Time 1345   PT Time Calculation (min) 35 min   Activity Tolerance Patient tolerated treatment well   Behavior During Therapy WFL for tasks assessed/performed      Past Medical History  Diagnosis Date  . Diabetes mellitus without complication   . Bipolar 1 disorder   . Fibromyalgia   . COPD (chronic obstructive pulmonary disease)   . CHF (congestive heart failure)   . Asthma   . Atrial fibrillation   . Neuropathy   . Fibromyalgia     Past Surgical History  Procedure Laterality Date  . Heel spur surgery    . Appendectomy    . Hernia repair    . Bladder suspension      2003, 2006 and 2010    There were no vitals taken for this visit.  Visit Diagnosis:  Bilateral low back pain without sciatica - Plan: PT plan of care cert/re-cert  Stiffness of multiple joints - Plan: PT plan of care cert/re-cert  Weakness - Plan: PT plan of care cert/re-cert  Abnormal posture - Plan: PT plan of care cert/re-cert      Subjective Assessment - 05/19/14 1328    Symptoms She report fibromyalgia, neuropathy, chronic pain ofver whole body with osteoporosis and severe OA in multiple joints.    PT last 3 years ago or less without significant benefit.    Pertinent History She reports chronic pain with degenerative changes    Limitations Sitting;Lifting;Standing;Walking   How long can you sit comfortably? 20 minutes max   How long can you stand comfortably? unable to stand for more than 5-6 min   How long can you walk comfortably? She walks 14  steps .    Diagnostic tests  EMG/NCV : "nerve disease"   Currently in Pain? Yes   Pain Score 9   No better thanthis   Pain Location Back  She has multiple areas of pain but these are not address   Pain Orientation Left;Right;Posterior   Pain Descriptors / Indicators --  She says she is no good   Pain Type Chronic pain   Pain Onset More than a month ago   Pain Frequency Constant   Aggravating Factors  Everything   Pain Relieving Factors Nothing   Multiple Pain Sites Yes          [facility] PT Assessment - 05/19/14 1337    Assessment   Medical Diagnosis lumbago   Onset Date --  years ago   Next MD Visit 05/30/14   Precautions   Precautions Fall   Precaution Comments Also implanted device RT back/hip  NO ELECTRIC STIM   Restrictions   Weight Bearing Restrictions No   Balance Screen   Has the patient fallen in the past 6 months --  5x   Has the patient had a decrease in activity level because of a fear of falling?  No   Is the patient reluctant to leave their home because of a fear of falling?  No   Prior Function   Level of Independence   Needs assistance with ADLs;Needs assistance with homemaking;Needs assistance with gait;Needs assistance with transfers   Posture/Postural Control   Posture/Postural Control --  Incr kyphosis, forward head, incr lordosis, RT shoulder high   AROM   Lumbar Flexion She can toush her tibias   Lumbar Extension decreased 1/2   Lumbar - Right Side Bend decreased 75%   Lumbar - Left Side Bend decr 75%   Lumbar - Right Rotation cecr 75%   Lumbar - Left Rotation decr 75%   Strength   Overall Strength --  Poor abdominals , LE 4+/5 below hips and 4/5 both hips.    Ambulation/Gait   Ambulation/Gait --  walked 5 feet with contact guard            PT Education - May 22, 2014 1418    Education provided Yes   Education Details Discussed plan and likelyhood of minimal improvement thoiugh they wanted to try.   Person(s) Educated Patient;Spouse   Methods Explanation    Comprehension Verbalized understanding            PT Long Term Goals - 22-May-2014 1422    PT LONG TERM GOAL #1   Title independent with HEP issued as of last visit   Time 4   Period Weeks   Status New   PT LONG TERM GOAL #2   Title She will report decrase pain 25% or more with normla activity at home   Time 4   Period Weeks   Status New   PT LONG TERM GOAL #3   Title She will report less assitance from husband for self care   Time 4   Period Weeks   Status New          Plan - 05-22-14 1420    Clinical Impression Statement She is significantly debilitated with multiple areas of pain and disability with multiple medical issues   Pt will benefit from skilled therapeutic intervention in order to improve on the following deficits Difficulty walking;Postural dysfunction;Pain;Decreased strength;Increased muscle spasms;Decreased activity tolerance   Rehab Potential Fair   PT Frequency 2x / week   PT Duration 4 weeks   PT Treatment/Interventions Moist Heat;Manual techniques;Passive range of motion;Patient/family education;Therapeutic exercise   PT Next Visit Plan Stretching exercise and manual with HMP   PT Home Exercise Plan Trunk stretching   Consulted and Agree with Plan of Care Patient          G-Codes - May 22, 2014 1436    Functional Assessment Tool Used FOTO   Functional Limitation Self care   Self Care Current Status (Z1696) At least 60 percent but less than 80 percent impaired, limited or restricted   Self Care Goal Status (V8938) At least 40 percent but less than 60 percent impaired, limited or restricted                            Problem List Patient Active Problem List   Diagnosis Date Noted  . Hypercapnic respiratory failure, chronic 04/21/2014  . Microcytic anemia 04/21/2014  . Fibromyalgia 04/14/2014  . 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 Bipolar 1 disorder 04/14/2014  . COPD pfts  pending  04/14/2014  . CHF (congestive heart failure) 04/14/2014  . Atrial fibrillation [I48.91] 04/12/2014    Darrel Hoover PT 05-22-14, 2:40 PM      PHYSICAL THERAPY DISCHARGE SUMMARY  Visits from Start of Care: Eval only  Current functional level related  to goals / functional outcomes: Unknown. She and her husband I thought were to transfer to clinic in [place]. They did not return   Remaining deficits: Unknown   Education / Equipment: None  Plan:                                                    Patient goals were not met. Patient is being discharged due to not returning since the last visit.  ?????    M , PT     08/02/14    9:59 AM  

## 2014-05-19 NOTE — Telephone Encounter (Signed)
Okay with me 

## 2014-05-19 NOTE — Telephone Encounter (Signed)
LMTCB

## 2014-05-20 NOTE — Telephone Encounter (Signed)
Spoke with pt and notified this was okay per VS  Per Ashytn pt can keep appt for sleep cons that is already scheduled and will address pulm issues at that time

## 2014-05-29 ENCOUNTER — Inpatient Hospital Stay (HOSPITAL_COMMUNITY)
Admission: EM | Admit: 2014-05-29 | Discharge: 2014-06-02 | DRG: 291 | Disposition: A | Discharge status: Home / Self Care | Payer: Medicare Other | Attending: Internal Medicine | Admitting: Internal Medicine

## 2014-05-29 ENCOUNTER — Encounter (HOSPITAL_COMMUNITY): Payer: Self-pay

## 2014-05-29 ENCOUNTER — Emergency Department (HOSPITAL_COMMUNITY): Payer: Medicare Other

## 2014-05-29 DIAGNOSIS — I5023 Acute on chronic systolic (congestive) heart failure: Principal | ICD-10-CM | POA: Diagnosis present

## 2014-05-29 DIAGNOSIS — Z9981 Dependence on supplemental oxygen: Secondary | ICD-10-CM

## 2014-05-29 DIAGNOSIS — Z7901 Long term (current) use of anticoagulants: Secondary | ICD-10-CM

## 2014-05-29 DIAGNOSIS — E1322 Other specified diabetes mellitus with diabetic chronic kidney disease: Secondary | ICD-10-CM | POA: Diagnosis present

## 2014-05-29 DIAGNOSIS — IMO0001 Reserved for inherently not codable concepts without codable children: Secondary | ICD-10-CM

## 2014-05-29 DIAGNOSIS — J9621 Acute and chronic respiratory failure with hypoxia: Secondary | ICD-10-CM | POA: Diagnosis present

## 2014-05-29 DIAGNOSIS — E1329 Other specified diabetes mellitus with other diabetic kidney complication: Secondary | ICD-10-CM

## 2014-05-29 DIAGNOSIS — N183 Chronic kidney disease, stage 3 unspecified: Secondary | ICD-10-CM | POA: Diagnosis present

## 2014-05-29 DIAGNOSIS — Z981 Arthrodesis status: Secondary | ICD-10-CM

## 2014-05-29 DIAGNOSIS — J45909 Unspecified asthma, uncomplicated: Secondary | ICD-10-CM | POA: Diagnosis present

## 2014-05-29 DIAGNOSIS — R739 Hyperglycemia, unspecified: Secondary | ICD-10-CM | POA: Diagnosis not present

## 2014-05-29 DIAGNOSIS — J811 Chronic pulmonary edema: Secondary | ICD-10-CM | POA: Diagnosis not present

## 2014-05-29 DIAGNOSIS — Z87891 Personal history of nicotine dependence: Secondary | ICD-10-CM

## 2014-05-29 DIAGNOSIS — D509 Iron deficiency anemia, unspecified: Secondary | ICD-10-CM | POA: Diagnosis present

## 2014-05-29 DIAGNOSIS — F319 Bipolar disorder, unspecified: Secondary | ICD-10-CM | POA: Diagnosis present

## 2014-05-29 DIAGNOSIS — I482 Chronic atrial fibrillation, unspecified: Secondary | ICD-10-CM

## 2014-05-29 DIAGNOSIS — E785 Hyperlipidemia, unspecified: Secondary | ICD-10-CM | POA: Diagnosis present

## 2014-05-29 DIAGNOSIS — I4891 Unspecified atrial fibrillation: Secondary | ICD-10-CM | POA: Diagnosis present

## 2014-05-29 DIAGNOSIS — J9610 Chronic respiratory failure, unspecified whether with hypoxia or hypercapnia: Secondary | ICD-10-CM | POA: Diagnosis present

## 2014-05-29 DIAGNOSIS — Z888 Allergy status to other drugs, medicaments and biological substances status: Secondary | ICD-10-CM | POA: Diagnosis not present

## 2014-05-29 DIAGNOSIS — K219 Gastro-esophageal reflux disease without esophagitis: Secondary | ICD-10-CM | POA: Diagnosis present

## 2014-05-29 DIAGNOSIS — Z66 Do not resuscitate: Secondary | ICD-10-CM | POA: Diagnosis present

## 2014-05-29 DIAGNOSIS — Z7951 Long term (current) use of inhaled steroids: Secondary | ICD-10-CM

## 2014-05-29 DIAGNOSIS — G629 Polyneuropathy, unspecified: Secondary | ICD-10-CM | POA: Diagnosis present

## 2014-05-29 DIAGNOSIS — E1165 Type 2 diabetes mellitus with hyperglycemia: Secondary | ICD-10-CM | POA: Diagnosis not present

## 2014-05-29 DIAGNOSIS — J441 Chronic obstructive pulmonary disease with (acute) exacerbation: Secondary | ICD-10-CM | POA: Diagnosis present

## 2014-05-29 DIAGNOSIS — I252 Old myocardial infarction: Secondary | ICD-10-CM | POA: Diagnosis not present

## 2014-05-29 DIAGNOSIS — Z881 Allergy status to other antibiotic agents status: Secondary | ICD-10-CM | POA: Diagnosis not present

## 2014-05-29 DIAGNOSIS — J438 Other emphysema: Secondary | ICD-10-CM | POA: Diagnosis present

## 2014-05-29 DIAGNOSIS — I5043 Acute on chronic combined systolic (congestive) and diastolic (congestive) heart failure: Secondary | ICD-10-CM

## 2014-05-29 DIAGNOSIS — I2510 Atherosclerotic heart disease of native coronary artery without angina pectoris: Secondary | ICD-10-CM | POA: Diagnosis present

## 2014-05-29 DIAGNOSIS — Z794 Long term (current) use of insulin: Secondary | ICD-10-CM

## 2014-05-29 DIAGNOSIS — J449 Chronic obstructive pulmonary disease, unspecified: Secondary | ICD-10-CM | POA: Diagnosis not present

## 2014-05-29 DIAGNOSIS — IMO0002 Reserved for concepts with insufficient information to code with codable children: Secondary | ICD-10-CM | POA: Diagnosis present

## 2014-05-29 DIAGNOSIS — J90 Pleural effusion, not elsewhere classified: Secondary | ICD-10-CM | POA: Diagnosis not present

## 2014-05-29 DIAGNOSIS — I509 Heart failure, unspecified: Secondary | ICD-10-CM | POA: Insufficient documentation

## 2014-05-29 DIAGNOSIS — I517 Cardiomegaly: Secondary | ICD-10-CM | POA: Diagnosis not present

## 2014-05-29 DIAGNOSIS — R0602 Shortness of breath: Secondary | ICD-10-CM | POA: Diagnosis not present

## 2014-05-29 DIAGNOSIS — E1365 Other specified diabetes mellitus with hyperglycemia: Secondary | ICD-10-CM

## 2014-05-29 DIAGNOSIS — E1122 Type 2 diabetes mellitus with diabetic chronic kidney disease: Secondary | ICD-10-CM | POA: Diagnosis present

## 2014-05-29 HISTORY — DX: Gastro-esophageal reflux disease without esophagitis: K21.9

## 2014-05-29 HISTORY — DX: Anemia, unspecified: D64.9

## 2014-05-29 HISTORY — DX: Personal history of other diseases of the digestive system: Z87.19

## 2014-05-29 LAB — BASIC METABOLIC PANEL
ANION GAP: 6 (ref 5–15)
BUN: 18 mg/dL (ref 6–23)
CALCIUM: 8.1 mg/dL — AB (ref 8.4–10.5)
CO2: 36 mmol/L — ABNORMAL HIGH (ref 19–32)
Chloride: 94 mEq/L — ABNORMAL LOW (ref 96–112)
Creatinine, Ser: 0.86 mg/dL (ref 0.50–1.10)
GFR calc Af Amer: 82 mL/min — ABNORMAL LOW (ref >90)
GFR, EST NON AFRICAN AMERICAN: 70 mL/min — AB (ref >90)
Glucose, Bld: 244 mg/dL — ABNORMAL HIGH (ref 70–99)
Potassium: 4.2 mmol/L (ref 3.5–5.1)
SODIUM: 136 mmol/L (ref 135–145)

## 2014-05-29 LAB — CBC
HCT: 29.2 % — ABNORMAL LOW (ref 36.0–46.0)
Hemoglobin: 8.1 g/dL — ABNORMAL LOW (ref 12.0–15.0)
MCH: 20.3 pg — ABNORMAL LOW (ref 26.0–34.0)
MCHC: 27.7 g/dL — ABNORMAL LOW (ref 30.0–36.0)
MCV: 73 fL — ABNORMAL LOW (ref 78.0–100.0)
PLATELETS: 333 10*3/uL (ref 150–400)
RBC: 4 MIL/uL (ref 3.87–5.11)
RDW: 16 % — AB (ref 11.5–15.5)
WBC: 13 10*3/uL — ABNORMAL HIGH (ref 4.0–10.5)

## 2014-05-29 LAB — GLUCOSE, CAPILLARY
Glucose-Capillary: 173 mg/dL — ABNORMAL HIGH (ref 70–99)
Glucose-Capillary: 179 mg/dL — ABNORMAL HIGH (ref 70–99)

## 2014-05-29 LAB — BRAIN NATRIURETIC PEPTIDE
B Natriuretic Peptide: 251.4 pg/mL — ABNORMAL HIGH (ref 0.0–100.0)
B Natriuretic Peptide: 291.8 pg/mL — ABNORMAL HIGH (ref 0.0–100.0)

## 2014-05-29 LAB — I-STAT TROPONIN, ED: TROPONIN I, POC: 0 ng/mL (ref 0.00–0.08)

## 2014-05-29 LAB — PROTIME-INR
INR: 1.83 — ABNORMAL HIGH (ref 0.00–1.49)
Prothrombin Time: 21.3 seconds — ABNORMAL HIGH (ref 11.6–15.2)

## 2014-05-29 MED ORDER — WARFARIN 1.25 MG HALF TABLET: 1.2500 mg | ORAL_TABLET | ORAL | Status: DC

## 2014-05-29 MED ORDER — SODIUM CHLORIDE 0.9 % IJ SOLN
3.0000 mL | Freq: Two times a day (BID) | INTRAMUSCULAR | Status: DC
  Administered 2014-05-29 – 2014-06-02 (×8): 3 mL via INTRAVENOUS

## 2014-05-29 MED ORDER — MIRTAZAPINE 15 MG PO TABS
15.0000 mg | ORAL_TABLET | Freq: Every day | ORAL | Status: DC
Start: 2014-05-29 — End: 2014-06-02
  Administered 2014-05-29 – 2014-06-01 (×4): 15 mg via ORAL
  Filled 2014-05-29 (×5): qty 1

## 2014-05-29 MED ORDER — ALUM & MAG HYDROXIDE-SIMETH 200-200-20 MG/5ML PO SUSP: 30.0000 mL | Freq: Four times a day (QID) | ORAL | Status: DC | PRN

## 2014-05-29 MED ORDER — ROSUVASTATIN CALCIUM 20 MG PO TABS
20.0000 mg | ORAL_TABLET | Freq: Every day | ORAL | Status: DC
  Administered 2014-05-30 – 2014-06-02 (×4): 20 mg via ORAL
  Filled 2014-05-29 (×4): qty 1

## 2014-05-29 MED ORDER — INSULIN ASPART 100 UNIT/ML ~~LOC~~ SOLN
0.0000 [IU] | Freq: Every day | SUBCUTANEOUS | Status: DC
  Administered 2014-05-30: 4 [IU] via SUBCUTANEOUS
  Administered 2014-05-31: 2 [IU] via SUBCUTANEOUS

## 2014-05-29 MED ORDER — INSULIN ASPART 100 UNIT/ML ~~LOC~~ SOLN: 10.0000 [IU] | Freq: Once | SUBCUTANEOUS | Status: DC

## 2014-05-29 MED ORDER — DOCUSATE SODIUM 100 MG PO CAPS
100.0000 mg | ORAL_CAPSULE | Freq: Every day | ORAL | Status: DC
  Administered 2014-05-29 – 2014-06-02 (×5): 100 mg via ORAL
  Filled 2014-05-29 (×5): qty 1

## 2014-05-29 MED ORDER — SODIUM CHLORIDE 0.9 % IJ SOLN: 3.0000 mL | INTRAMUSCULAR | Status: DC | PRN

## 2014-05-29 MED ORDER — FAMOTIDINE 40 MG PO TABS
40.0000 mg | ORAL_TABLET | Freq: Two times a day (BID) | ORAL | Status: DC
Start: 2014-05-29 — End: 2014-06-02
  Administered 2014-05-29 – 2014-06-02 (×8): 40 mg via ORAL
  Filled 2014-05-29 (×9): qty 1

## 2014-05-29 MED ORDER — GABAPENTIN 300 MG PO CAPS
900.0000 mg | ORAL_CAPSULE | Freq: Three times a day (TID) | ORAL | Status: DC
  Administered 2014-05-29 – 2014-06-02 (×11): 900 mg via ORAL
  Filled 2014-05-29 (×14): qty 3

## 2014-05-29 MED ORDER — MORPHINE SULFATE 2 MG/ML IJ SOLN: 2.0000 mg | INTRAMUSCULAR | Status: DC | PRN

## 2014-05-29 MED ORDER — CEFTRIAXONE SODIUM IN DEXTROSE 20 MG/ML IV SOLN
1.0000 g | INTRAVENOUS | Status: DC
  Administered 2014-05-29: 1 g via INTRAVENOUS
  Filled 2014-05-29 (×2): qty 50

## 2014-05-29 MED ORDER — DEXTROSE 5 % IV SOLN
500.0000 mg | INTRAVENOUS | Status: DC
  Administered 2014-05-30: 500 mg via INTRAVENOUS
  Filled 2014-05-29 (×2): qty 500

## 2014-05-29 MED ORDER — METHYLPREDNISOLONE SODIUM SUCC 40 MG IJ SOLR
40.0000 mg | Freq: Four times a day (QID) | INTRAMUSCULAR | Status: DC
  Administered 2014-05-29 – 2014-05-30 (×2): 40 mg via INTRAVENOUS
  Filled 2014-05-29 (×7): qty 1

## 2014-05-29 MED ORDER — FENOFIBRATE 54 MG PO TABS
54.0000 mg | ORAL_TABLET | Freq: Every day | ORAL | Status: DC
  Administered 2014-05-30 – 2014-06-02 (×4): 54 mg via ORAL
  Filled 2014-05-29 (×4): qty 1

## 2014-05-29 MED ORDER — SODIUM CHLORIDE 0.9 % IJ SOLN: 3.0000 mL | Freq: Two times a day (BID) | INTRAMUSCULAR | Status: DC

## 2014-05-29 MED ORDER — FUROSEMIDE 10 MG/ML IJ SOLN
80.0000 mg | Freq: Once | INTRAMUSCULAR | Status: AC
  Administered 2014-05-29: 80 mg via INTRAVENOUS
  Filled 2014-05-29: qty 8

## 2014-05-29 MED ORDER — SODIUM CHLORIDE 0.9 % IV SOLN: 250.0000 mL | INTRAVENOUS | Status: DC | PRN

## 2014-05-29 MED ORDER — INSULIN ASPART 100 UNIT/ML ~~LOC~~ SOLN
0.0000 [IU] | Freq: Three times a day (TID) | SUBCUTANEOUS | Status: DC
  Administered 2014-05-30: 15 [IU] via SUBCUTANEOUS
  Administered 2014-05-30: 11 [IU] via SUBCUTANEOUS
  Administered 2014-05-30: 15 [IU] via SUBCUTANEOUS
  Administered 2014-05-31: 3 [IU] via SUBCUTANEOUS
  Administered 2014-05-31 (×2): 7 [IU] via SUBCUTANEOUS
  Administered 2014-06-01: 3 [IU] via SUBCUTANEOUS
  Administered 2014-06-01: 4 [IU] via SUBCUTANEOUS
  Administered 2014-06-01: 3 [IU] via SUBCUTANEOUS

## 2014-05-29 MED ORDER — FUROSEMIDE 10 MG/ML IJ SOLN
40.0000 mg | Freq: Two times a day (BID) | INTRAMUSCULAR | Status: DC
  Filled 2014-05-29 (×2): qty 4

## 2014-05-29 MED ORDER — ONDANSETRON HCL 4 MG/2ML IJ SOLN: 4.0000 mg | Freq: Four times a day (QID) | INTRAMUSCULAR | Status: DC | PRN

## 2014-05-29 MED ORDER — WARFARIN - PHARMACIST DOSING INPATIENT
Freq: Every day | Status: DC
  Administered 2014-05-30 – 2014-06-01 (×3)

## 2014-05-29 MED ORDER — DILTIAZEM HCL 30 MG PO TABS
30.0000 mg | ORAL_TABLET | Freq: Two times a day (BID) | ORAL | Status: DC
  Administered 2014-05-29 – 2014-06-02 (×8): 30 mg via ORAL
  Filled 2014-05-29 (×9): qty 1

## 2014-05-29 MED ORDER — ESCITALOPRAM OXALATE 10 MG PO TABS
10.0000 mg | ORAL_TABLET | Freq: Every day | ORAL | Status: DC
Start: 2014-05-29 — End: 2014-06-02
  Administered 2014-05-29 – 2014-06-01 (×4): 10 mg via ORAL
  Filled 2014-05-29 (×6): qty 1

## 2014-05-29 MED ORDER — IPRATROPIUM-ALBUTEROL 0.5-2.5 (3) MG/3ML IN SOLN
3.0000 mL | RESPIRATORY_TRACT | Status: DC
  Administered 2014-05-29 – 2014-06-02 (×19): 3 mL via RESPIRATORY_TRACT
  Filled 2014-05-29 (×21): qty 3

## 2014-05-29 MED ORDER — INSULIN GLARGINE 100 UNIT/ML ~~LOC~~ SOLN
60.0000 [IU] | Freq: Every day | SUBCUTANEOUS | Status: DC
  Administered 2014-05-29 – 2014-05-30 (×2): 60 [IU] via SUBCUTANEOUS
  Filled 2014-05-29 (×3): qty 0.6

## 2014-05-29 MED ORDER — WARFARIN SODIUM 4 MG PO TABS
4.0000 mg | ORAL_TABLET | Freq: Once | ORAL | Status: AC
  Administered 2014-05-29: 4 mg via ORAL
  Filled 2014-05-29: qty 1

## 2014-05-29 MED ORDER — WARFARIN 1.25 MG HALF TABLET: 1.2500 mg | ORAL_TABLET | Freq: Every evening | ORAL | Status: DC

## 2014-05-29 MED ORDER — IPRATROPIUM-ALBUTEROL 0.5-2.5 (3) MG/3ML IN SOLN
3.0000 mL | Freq: Once | RESPIRATORY_TRACT | Status: AC
  Administered 2014-05-29: 3 mL via RESPIRATORY_TRACT
  Filled 2014-05-29: qty 3

## 2014-05-29 MED ORDER — OXYCODONE HCL 5 MG PO TABS
5.0000 mg | ORAL_TABLET | Freq: Four times a day (QID) | ORAL | Status: DC | PRN
  Administered 2014-05-29 – 2014-06-02 (×8): 5 mg via ORAL
  Filled 2014-05-29 (×9): qty 1

## 2014-05-29 MED ORDER — WARFARIN SODIUM 2.5 MG PO TABS
2.5000 mg | ORAL_TABLET | ORAL | Status: DC
  Filled 2014-05-29: qty 1

## 2014-05-29 MED ORDER — PREGABALIN 25 MG PO CAPS
50.0000 mg | ORAL_CAPSULE | Freq: Three times a day (TID) | ORAL | Status: DC
Start: 2014-05-29 — End: 2014-06-02
  Administered 2014-05-29 – 2014-06-02 (×11): 50 mg via ORAL
  Filled 2014-05-29 (×11): qty 2

## 2014-05-29 MED ORDER — WARFARIN - PHYSICIAN DOSING INPATIENT: Freq: Every day | Status: DC

## 2014-05-29 MED ORDER — ONDANSETRON HCL 4 MG PO TABS: 4.0000 mg | ORAL_TABLET | Freq: Four times a day (QID) | ORAL | Status: DC | PRN

## 2014-05-29 MED ORDER — PANTOPRAZOLE SODIUM 40 MG PO TBEC
40.0000 mg | DELAYED_RELEASE_TABLET | Freq: Every day | ORAL | Status: DC
  Administered 2014-05-30 – 2014-06-02 (×4): 40 mg via ORAL
  Filled 2014-05-29 (×3): qty 1

## 2014-05-29 MED ORDER — FLUTICASONE PROPIONATE 50 MCG/ACT NA SUSP
1.0000 | Freq: Two times a day (BID) | NASAL | Status: DC
  Administered 2014-05-29 – 2014-06-01 (×7): 1 via NASAL
  Filled 2014-05-29: qty 16

## 2014-05-29 NOTE — ED Notes (Signed)
CBG 138

## 2014-05-29 NOTE — ED Notes (Signed)
Report attempt x 1 

## 2014-05-29 NOTE — H&P (Addendum)
[facility] History and Physical  Dionicia Thwaits UQ:[id number] DOB: 1951-04-26 DOA: 05/29/2014  Referring physician:  PCP: Pixie Casino, MD   Chief Complaint: Shortness of breath  HPI: Denise Freeman is a 63 y.o. female with a past medical history morbid obesity, chronic hypoxemic respiratory failure requiring 3 L of supplemental oxygen at baseline, chronic obstructive pulmonary disease, congestive heart failure unknown ejection fraction, obesity hypoventilation syndrome presenting to the emergency department with complaints of shortness of breath. She states symptoms started approximately 3 days ago with increasing shortness of breath from baseline that was associated with cough having yellow to green sputum production, along with increased wheezing. She also complains of increasing bilateral extremity edema. She denies fevers, chills, chest pain, hemoptysis, hematemesis, abdominal pain. Workup in the emergency department showed [facility] with mild interstitial edema pattern that could be consistent with acute CHF for which she was administered 80 mg of IV Lasix in the emergency room.                                                                                                                                                                                                                                                                        Review of Systems:  Constitutional:  No weight loss, night sweats, Fevers, chills, fatigue.  HEENT:  No headaches, Difficulty swallowing,Tooth/dental problems,Sore throat,  No sneezing, itching, ear ache, nasal congestion, post nasal drip,  Cardio-vascular:  No chest pain, Orthopnea, PND, positive for swelling in lower extremities, anasarca, dizziness, palpitations  GI:  No heartburn, indigestion, abdominal pain, nausea, vomiting, diarrhea, change in bowel habits, loss of appetite  Resp:  Positive for shortness of breath with exertion or at  rest. No excess mucus, positive for productive cough, No non-productive cough, No coughing up of blood.No change in color of mucus.No wheezing.No chest wall deformity  Skin:  no rash or lesions.  GU:  no dysuria, change in color of urine, no urgency or frequency. No flank pain.  Musculoskeletal:  No joint pain or swelling. No decreased range of motion. No back pain.  Psych:  No change in mood or affect. No depression or anxiety. No memory loss.   Past Medical History  Diagnosis Date  . Diabetes mellitus  without complication   . Bipolar 1 disorder   . Fibromyalgia   . COPD (chronic obstructive pulmonary disease)   . CHF (congestive heart failure)   . Asthma   . Atrial fibrillation   . Neuropathy   . Fibromyalgia   . CHF (congestive heart failure)    Past Surgical History  Procedure Laterality Date  . Heel spur surgery    . Appendectomy    . Hernia repair    . Bladder suspension      2003, 2006 and 2010   Social History:  reports that she quit smoking about 28 years ago. Her smoking use included Cigarettes. She has a 28 pack-year smoking history. She does not have any smokeless tobacco history on file. She reports that she does not drink alcohol or use illicit drugs.  Allergies  Allergen Reactions  . Ancef [Cefazolin]   . Augmentin [Amoxicillin-Pot Clavulanate]   . Ciprofloxacin   . Haldol [Haloperidol]   . Levaquin [Levofloxacin In D5w]   . Nsaids Diarrhea  . Tamiflu [Oseltamivir Phosphate]   . Zoloft [Sertraline Hcl]     Family History  Problem Relation Age of Onset  . Heart disease Mother   . COPD Mother   . Cancer Mother     Breast cancer  . Diabetes Mother   . Heart disease Father   . COPD Sister   . Heart disease Sister   . Diabetes Sister   . Heart disease Maternal Grandmother      Prior to Admission medications   Medication Sig Start Date End Date Taking? Authorizing Provider  ANORO ELLIPTA 62.5-25 MCG/INH AEPB Inhale 2 puffs into the lungs once.  Only open the device one time and then take your two separate drags to be sure you get it all 04/20/14  Yes Tanda Rockers, MD  cetirizine (ZYRTEC) 10 MG tablet Take 10 mg by mouth daily.   Yes Historical Provider, MD  diltiazem (CARDIZEM) 30 MG tablet Take 1 tablet (30 mg total) by mouth 2 (two) times daily. 04/12/14  Yes Pixie Casino, MD  docusate sodium (COLACE) 100 MG capsule Take 100 mg by mouth daily.   Yes Historical Provider, MD  escitalopram (LEXAPRO) 10 MG tablet Take 10 mg by mouth at bedtime.    Yes Historical Provider, MD  famotidine (PEPCID) 40 MG tablet Take 40 mg by mouth 2 (two) times daily.   Yes Historical Provider, MD  fenofibrate 54 MG tablet Take 54 mg by mouth daily.   Yes Historical Provider, MD  fluticasone (FLONASE) 50 MCG/ACT nasal spray Place 1 spray into both nostrils 2 (two) times daily.   Yes Historical Provider, MD  furosemide (LASIX) 40 MG tablet Take 40 mg by mouth 2 (two) times daily.   Yes Historical Provider, MD  gabapentin (NEURONTIN) 300 MG capsule Take 900 mg by mouth 3 (three) times daily.    Yes Historical Provider, MD  Insulin Glargine (TOUJEO SOLOSTAR) 300 UNIT/ML SOPN Inject 60 Units into the skin at bedtime. 05/13/14  Yes Philemon Kingdom, MD  insulin lispro (HUMALOG KWIKPEN) 100 UNIT/ML KiwkPen Inject 0.16-0.22 mLs (16-22 Units total) into the skin 3 (three) times daily. Patient taking differently: Inject 16-22 Units into the skin 3 (three) times daily. <130 = 16 units <160 = 22 units 05/13/14  Yes Philemon Kingdom, MD  ipratropium (ATROVENT) 0.02 % nebulizer solution Take 0.5 mg by nebulization 2 (two) times daily.   Yes Historical Provider, MD  metolazone (ZAROXOLYN) 2.5 MG tablet Take 2.5  mg by mouth 3 (three) times a week. Takes on Monday, Wed, and Fri   Yes Historical Provider, MD  mirtazapine (REMERON) 15 MG tablet Take 15 mg by mouth at bedtime.   Yes Historical Provider, MD  nystatin (MYCOSTATIN/NYSTOP) 100000 UNIT/GM POWD Apply topically 2  (two) times daily.   Yes Historical Provider, MD  ondansetron (ZOFRAN-ODT) 4 MG disintegrating tablet Take 4 mg by mouth every 8 (eight) hours as needed for nausea or vomiting.   Yes Historical Provider, MD  oxyCODONE (OXY IR/ROXICODONE) 5 MG immediate release tablet Take 5 mg by mouth 2 (two) times daily.   Yes Historical Provider, MD  OXYGEN Inhale into the lungs continuous. 3 liters 24/7   Yes Historical Provider, MD  potassium chloride SA (K-DUR,KLOR-CON) 20 MEQ tablet Take 40 mEq by mouth daily.    Yes Historical Provider, MD  pregabalin (LYRICA) 50 MG capsule Take 50 mg by mouth 3 (three) times daily.   Yes Historical Provider, MD  rosuvastatin (CRESTOR) 20 MG tablet Take 20 mg by mouth daily.   Yes Historical Provider, MD  warfarin (COUMADIN) 2.5 MG tablet Take as directed by mouth daily per INR Patient taking differently: Take 1.25-2.5 mg by mouth every evening. Patient takes 1.25mg  on Monday and Friday and 2.5mg  all other days 04/12/14  Yes Pixie Casino, MD  azithromycin (ZITHROMAX Z-PAK) 250 MG tablet 2 po day one, then 1 daily x 4 days Patient not taking: Reported on 05/29/2014 05/18/14   Richarda Blade, MD   Physical Exam: Filed Vitals:   05/29/14 1430 05/29/14 1454 05/29/14 1505 05/29/14 1557  BP: 115/64 115/64  126/37  Pulse: 94 85  79  Resp: 22 36  17  SpO2: 100% 100% 90% 100%    Wt Readings from Last 3 Encounters:  05/18/14 96.616 kg (213 lb)  05/13/14 96.163 kg (212 lb)  04/20/14 97.523 kg (215 lb)    General:  Chronically ill-appearing, morbidly obese, mild distress Eyes: PERRL, normal lids, irises & conjunctiva ENT: grossly normal hearing, lips & tongue Neck: no LAD, masses or thyromegaly Cardiovascular: RRR, no m/r/g. She has 1-2+ bilateral extremity pitting edema, shallow ulceration noted over lateral aspect of left lower extremity, no evidence of infection Telemetry: SR, no arrhythmias  Respiratory: Has extensive bilateral expiratory wheezes with diminished  breath sounds bilaterally, did not appreciate crackles rhonchi or rales Abdomen: soft, ntnd Skin: no rash or induration seen on limited exam Musculoskeletal: grossly normal tone BUE/BLE Psychiatric: grossly normal mood and affect, speech fluent and appropriate Neurologic: grossly non-focal.          Labs on Admission:  Basic Metabolic Panel:  Recent Labs Lab 05/29/14 1140  NA 136  K 4.2  CL 94*  CO2 36*  GLUCOSE 244*  BUN 18  CREATININE 0.86  CALCIUM 8.1*   Liver Function Tests: No results for input(s): AST, ALT, ALKPHOS, BILITOT, PROT, ALBUMIN in the last 168 hours. No results for input(s): LIPASE, AMYLASE in the last 168 hours. No results for input(s): AMMONIA in the last 168 hours. CBC:  Recent Labs Lab 05/29/14 1140  WBC 13.0*  HGB 8.1*  HCT 29.2*  MCV 73.0*  PLT 333   Cardiac Enzymes: No results for input(s): CKTOTAL, CKMB, CKMBINDEX, TROPONINI in the last 168 hours.  BNP (last 3 results)  Recent Labs  03/12/14 0957  PROBNP 2272.0*   CBG: No results for input(s): GLUCAP in the last 168 hours.  Radiological Exams on Admission: Dg Chest 2 View  05/29/2014  CLINICAL DATA:  Shortness of breath, recent pneumonia, COPD  EXAM: CHEST - 2 VIEW  COMPARISON:  05/18/2014  FINDINGS: Cardiomegaly evident with diffuse vascular and interstitial prominence and peripheral Kerley B lines, compatible with mild edema/ CHF. Minor basilar atelectasis. Stable nodular scarring along the right minor fissure peripherally. No pneumothorax. Trachea is midline. Lower cervical fusion hardware noted. Degenerative changes of the thoracic spine.  IMPRESSION: Cardiomegaly with mild interstitial edema pattern compatible with CHF.   Electronically Signed   By: Daryll Brod M.D.   On: 05/29/2014 12:09    EKG: Independently reviewed.   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 Chronic obstructive pulmonary disease exacerbation. Patient having a history of COPD with chronic hypoxemic respiratory failure requiring 3 L supplemental oxygen at baseline presenting with increasing cough, wheezing, shortness of breath. Will initiate steroid therapy with solumedrol 40 mg IV every 6 hours, scheduled duo nebs every 4 hours, start empiric IV antibiotic therapy with ceftriaxone and azithromycin. Patient will be admitted to telemetry. Repeat chest x-ray in a.m. 2. Possible acute on chronic congestive heart failure, unknown ejection fraction. Chest x-ray showing signs suggestive of pulmonary edema, for which she was given 80 mg of IV Lasix in the emergency room. Will continue Lasix at 40 mg IV twice a day tommorrow, check a BNP, follow her kidney function and reassess volume status in a.m. 3. Atrial fibrillation. Currently rate controlled, will continue home regimen with Cardizem 30 g by mouth twice a day, place her on telemetry.  4. Chronic anticoagulation. Patient chronically anticoagulated for A. fib, presenting with INR 1.83. Consult pharmacy for warfarin dosing 5. Insulin-dependent diabetes mellitus, poorly controlled. Had A1c of 7.6 on 05/13/2014 presenting with blood sugar of 244. I anticipate blood sugars increasing with the administration of IV steroids. Will administered 10 units of NovoLog now, Accu-Cheks every before meals and every at bedtime with resistant sliding scale coverage. For now continue glargine 60 units at bedtime. 6. Possible community acquire pneumonia. Patient presenting with COPD exacerbation at it is possible underlying infectious process precipitating acute decompensation. Will cover with empiric IV antibiotic therapy, ceftriaxone and azithromycin. Repeat chest x-ray in a.m. Will check a flu swab.  7. Dyslipidemia. Continue Crestor 20 mg by mouth  daily 8. DVT prophylaxis. On anticoagulation with Coumadin    Code Status: DO NOT RESUSCITATE. I spoke with patient and husband at bedside, she does not wish for heroic measures to be undertaken in the event of cardiopulmonary arrest.  Family Communication: Spoke to her husband who was present at bedside Disposition Plan: Will admit to the inpatient service, anticipate she'll require greater than 2 nights hospitalization  Time spent: 70 minutes  Kelvin Cellar [facility] Pager [phone]

## 2014-05-29 NOTE — Progress Notes (Signed)
ANTICOAGULATION CONSULT NOTE - Initial Consult  Pharmacy Consult for Coumadin  Indication: atrial fibrillation  Allergies  Allergen Reactions  . Ancef [Cefazolin]   . Augmentin [Amoxicillin-Pot Clavulanate]   . Ciprofloxacin   . Haldol [Haloperidol]   . Levaquin [Levofloxacin In D5w]   . Nsaids Diarrhea  . Tamiflu [Oseltamivir Phosphate]   . Zoloft [Sertraline Hcl]     Patient Measurements:     Vital Signs: BP: 126/37 mmHg (12/27 1557) Pulse Rate: 79 (12/27 1557)  Labs:  Recent Labs  05/29/14 1140  HGB 8.1*  HCT 29.2*  PLT 333  LABPROT 21.3*  INR 1.83*  CREATININE 0.86    Estimated Creatinine Clearance: 68.3 mL/min (by C-G formula based on Cr of 0.86).   Medical History: Past Medical History  Diagnosis Date  . Diabetes mellitus without complication   . Bipolar 1 disorder   . Fibromyalgia   . COPD (chronic obstructive pulmonary disease)   . CHF (congestive heart failure)   . Asthma   . Atrial fibrillation   . Neuropathy     Disc Back   . Fibromyalgia   . CHF (congestive heart failure)   . Coronary artery disease   . Anemia   . History of hiatal hernia   . GERD (gastroesophageal reflux disease)   . Myocardial infarction 1990  . Shortness of breath dyspnea     Medications:  Prescriptions prior to admission  Medication Sig Dispense Refill Last Dose  . ANORO ELLIPTA 62.5-25 MCG/INH AEPB Inhale 2 puffs into the lungs once. Only open the device one time and then take your two separate drags to be sure you get it all 60 each 11 05/29/2014 at Unknown time  . cetirizine (ZYRTEC) 10 MG tablet Take 10 mg by mouth daily.   05/29/2014 at Unknown time  . diltiazem (CARDIZEM) 30 MG tablet Take 1 tablet (30 mg total) by mouth 2 (two) times daily. 180 tablet 1 05/29/2014 at Unknown time  . docusate sodium (COLACE) 100 MG capsule Take 100 mg by mouth daily.   05/28/2014 at Unknown time  . escitalopram (LEXAPRO) 10 MG tablet Take 10 mg by mouth at bedtime.     05/28/2014 at Unknown time  . famotidine (PEPCID) 40 MG tablet Take 40 mg by mouth 2 (two) times daily.   05/29/2014 at Unknown time  . fenofibrate 54 MG tablet Take 54 mg by mouth daily.   05/29/2014 at Unknown time  . fluticasone (FLONASE) 50 MCG/ACT nasal spray Place 1 spray into both nostrils 2 (two) times daily.   05/28/2014 at Unknown time  . furosemide (LASIX) 40 MG tablet Take 40 mg by mouth 2 (two) times daily.   05/29/2014 at Unknown time  . gabapentin (NEURONTIN) 300 MG capsule Take 900 mg by mouth 3 (three) times daily.    05/29/2014 at Unknown time  . Insulin Glargine (TOUJEO SOLOSTAR) 300 UNIT/ML SOPN Inject 60 Units into the skin at bedtime. 9 pen 1 05/28/2014 at Unknown time  . insulin lispro (HUMALOG KWIKPEN) 100 UNIT/ML KiwkPen Inject 0.16-0.22 mLs (16-22 Units total) into the skin 3 (three) times daily. (Patient taking differently: Inject 16-22 Units into the skin 3 (three) times daily. <130 = 16 units <160 = 22 units) 45 mL 1 05/29/2014 at Unknown time  . ipratropium (ATROVENT) 0.02 % nebulizer solution Take 0.5 mg by nebulization 2 (two) times daily.   05/28/2014 at Unknown time  . metolazone (ZAROXOLYN) 2.5 MG tablet Take 2.5 mg by mouth 3 (three) times a  week. Takes on Monday, Wed, and Fri   05/27/2014  . mirtazapine (REMERON) 15 MG tablet Take 15 mg by mouth at bedtime.   05/28/2014 at Unknown time  . nystatin (MYCOSTATIN/NYSTOP) 100000 UNIT/GM POWD Apply topically 2 (two) times daily.   unknown  . ondansetron (ZOFRAN-ODT) 4 MG disintegrating tablet Take 4 mg by mouth every 8 (eight) hours as needed for nausea or vomiting.   05/29/2014 at Unknown time  . oxyCODONE (OXY IR/ROXICODONE) 5 MG immediate release tablet Take 5 mg by mouth 2 (two) times daily.   05/29/2014 at Unknown time  . OXYGEN Inhale into the lungs continuous. 3 liters 24/7   05/29/2014 at Unknown time  . potassium chloride SA (K-DUR,KLOR-CON) 20 MEQ tablet Take 40 mEq by mouth daily.    05/29/2014 at Unknown  time  . pregabalin (LYRICA) 50 MG capsule Take 50 mg by mouth 3 (three) times daily.   05/29/2014 at Unknown time  . rosuvastatin (CRESTOR) 20 MG tablet Take 20 mg by mouth daily.   05/29/2014 at Unknown time  . warfarin (COUMADIN) 2.5 MG tablet Take as directed by mouth daily per INR (Patient taking differently: Take 1.25-2.5 mg by mouth every evening. Patient takes 1.25mg  on Monday and Friday and 2.5mg  all other days) 100 tablet 0 05/28/2014 at Unknown time  . azithromycin (ZITHROMAX Z-PAK) 250 MG tablet 2 po day one, then 1 daily x 4 days (Patient not taking: Reported on 05/29/2014) 5 tablet 0 Taking    Assessment: 56 YOF with h/o Afib currently rate controlled. Pharmacy consulted to resume home Coumadin. INR on admission was subtherapeutic at 1.83. Patient takes Coumadin 1.25 mg on Monday and Friday and 2.5 mg on all other days at home. CHA2DS2-VASc score calculated to be 3. Drug interactions with fenofibrate and azithromycin noted.   Goal of Therapy:  INR 2-3 Monitor platelets by anticoagulation protocol: Yes   Plan:  -Give Coumadin 4 mg x 1 dose tonight  -Monitor daily PT/INR and s/s of bleeding   Albertina Parr, PharmD., BCPS Clinical Pharmacist Pager [phone]

## 2014-05-29 NOTE — ED Provider Notes (Signed)
CSN: [id number]     Arrival date & time 05/29/14  [id number] History   First MD Initiated Contact with Patient 05/29/14 1208     Chief Complaint  Patient presents with  . Shortness of Breath  . Congestive Heart Failure     (Consider location/radiation/quality/duration/timing/severity/associated sxs/prior Treatment) HPI Comments: The patient is a 63 year old female with COPD, A. fib on Coumadin, bipolar, poorly controlled diabetes, CHF presents emergency room chief complaint of dyspnea and increased and lower extremity edema for 3 days. Patient reports being evaluated by her primary care, recently decreased her Lasix from 40 twice a day to 20 twice a day. She reports increase in shortness of breath and lower extremity edema since. She reports chronic cough. Denies fever, chest pain. Patient is on 3 L of oxygen at home at baseline. PCP: [facility] in [place].  Patient is a 63 y.o. female presenting with shortness of breath and CHF. The history is provided by the patient. No language interpreter was used.  Shortness of Breath Associated symptoms: cough   Associated symptoms: no chest pain, no fever and no vomiting   Congestive Heart Failure Associated symptoms include coughing. Pertinent negatives include no chest pain, chills, fever, nausea or vomiting.    Past Medical History  Diagnosis Date  . Diabetes mellitus without complication   . Bipolar 1 disorder   . Fibromyalgia   . COPD (chronic obstructive pulmonary disease)   . CHF (congestive heart failure)   . Asthma   . Atrial fibrillation   . Neuropathy   . Fibromyalgia   . CHF (congestive heart failure)    Past Surgical History  Procedure Laterality Date  . Heel spur surgery    . Appendectomy    . Hernia repair    . Bladder suspension      2003, 2006 and 2010   Family History  Problem Relation Age of Onset  . Heart disease Mother   . COPD Mother   . Cancer Mother     Breast cancer  . Diabetes Mother   . Heart disease  Father   . COPD Sister   . Heart disease Sister   . Diabetes Sister   . Heart disease Maternal Grandmother    History  Substance Use Topics  . Smoking status: Former Smoker -- 2.00 packs/day for 14 years    Types: Cigarettes    Quit date: 04/12/1986  . Smokeless tobacco: Not on file  . Alcohol Use: No   OB History    No data available     Review of Systems  Constitutional: Negative for fever and chills.  Respiratory: Positive for cough and shortness of breath.   Cardiovascular: Positive for leg swelling. Negative for chest pain.  Gastrointestinal: Negative for nausea and vomiting.      Allergies  Ancef; Augmentin; Ciprofloxacin; Haldol; Levaquin; Nsaids; Tamiflu; and Zoloft  Home Medications   Prior to Admission medications   Medication Sig Start Date End Date Taking? Authorizing Provider  ANORO ELLIPTA 62.5-25 MCG/INH AEPB Inhale 2 puffs into the lungs once. Only open the device one time and then take your two separate drags to be sure you get it all 04/20/14  Yes Tanda Rockers, MD  cetirizine (ZYRTEC) 10 MG tablet Take 10 mg by mouth daily.   Yes Historical Provider, MD  diltiazem (CARDIZEM) 30 MG tablet Take 1 tablet (30 mg total) by mouth 2 (two) times daily. 04/12/14  Yes Pixie Casino, MD  docusate sodium (COLACE) 100 MG capsule  Take 100 mg by mouth daily.   Yes Historical Provider, MD  escitalopram (LEXAPRO) 10 MG tablet Take 10 mg by mouth at bedtime.    Yes Historical Provider, MD  famotidine (PEPCID) 40 MG tablet Take 40 mg by mouth 2 (two) times daily.   Yes Historical Provider, MD  fenofibrate 54 MG tablet Take 54 mg by mouth daily.   Yes Historical Provider, MD  fluticasone (FLONASE) 50 MCG/ACT nasal spray Place 1 spray into both nostrils 2 (two) times daily.   Yes Historical Provider, MD  furosemide (LASIX) 40 MG tablet Take 40 mg by mouth 2 (two) times daily.   Yes Historical Provider, MD  gabapentin (NEURONTIN) 300 MG capsule Take 900 mg by mouth 3  (three) times daily.    Yes Historical Provider, MD  Insulin Glargine (TOUJEO SOLOSTAR) 300 UNIT/ML SOPN Inject 60 Units into the skin at bedtime. 05/13/14  Yes Philemon Kingdom, MD  insulin lispro (HUMALOG KWIKPEN) 100 UNIT/ML KiwkPen Inject 0.16-0.22 mLs (16-22 Units total) into the skin 3 (three) times daily. Patient taking differently: Inject 16-22 Units into the skin 3 (three) times daily. <130 = 16 units <160 = 22 units 05/13/14  Yes Philemon Kingdom, MD  ipratropium (ATROVENT) 0.02 % nebulizer solution Take 0.5 mg by nebulization 2 (two) times daily.   Yes Historical Provider, MD  metolazone (ZAROXOLYN) 2.5 MG tablet Take 2.5 mg by mouth 3 (three) times a week. Takes on Monday, Wed, and Fri   Yes Historical Provider, MD  mirtazapine (REMERON) 15 MG tablet Take 15 mg by mouth at bedtime.   Yes Historical Provider, MD  nystatin (MYCOSTATIN/NYSTOP) 100000 UNIT/GM POWD Apply topically 2 (two) times daily.   Yes Historical Provider, MD  ondansetron (ZOFRAN-ODT) 4 MG disintegrating tablet Take 4 mg by mouth every 8 (eight) hours as needed for nausea or vomiting.   Yes Historical Provider, MD  oxyCODONE (OXY IR/ROXICODONE) 5 MG immediate release tablet Take 5 mg by mouth 2 (two) times daily.   Yes Historical Provider, MD  OXYGEN Inhale into the lungs continuous. 3 liters 24/7   Yes Historical Provider, MD  potassium chloride SA (K-DUR,KLOR-CON) 20 MEQ tablet Take 40 mEq by mouth daily.    Yes Historical Provider, MD  pregabalin (LYRICA) 50 MG capsule Take 50 mg by mouth 3 (three) times daily.   Yes Historical Provider, MD  rosuvastatin (CRESTOR) 20 MG tablet Take 20 mg by mouth daily.   Yes Historical Provider, MD  warfarin (COUMADIN) 2.5 MG tablet Take as directed by mouth daily per INR Patient taking differently: Take 1.25-2.5 mg by mouth every evening. Patient takes 1.25mg  on Monday and Friday and 2.5mg  all other days 04/12/14  Yes Pixie Casino, MD  azithromycin (ZITHROMAX Z-PAK) 250 MG  tablet 2 po day one, then 1 daily x 4 days Patient not taking: Reported on 05/29/2014 05/18/14   Richarda Blade, MD   BP 141/75 mmHg  Pulse 87  Resp 22  SpO2 99% Physical Exam  Constitutional: She is oriented to person, place, and time. She appears well-developed and well-nourished. No distress.  HENT:  Head: Normocephalic and atraumatic.  Cardiovascular: Normal rate.  An irregularly irregular rhythm present.  Trace pitting edema bilaterally.  Pulmonary/Chest: No accessory muscle usage. No respiratory distress. She has wheezes.  Decreased breath sounds in all posterior lung field secondary to body habitus. Mild expiratory wheezing over anterior chest wall. Patient is able to speak in complete sentences.   Abdominal: Soft. Normal appearance.  Neurological: She  is alert and oriented to person, place, and time.  Skin: Skin is warm and dry.  Psychiatric:  Abrasive.  Nursing note and vitals reviewed.   ED Course  Procedures (including critical care time) Labs Review Labs Reviewed  BASIC METABOLIC PANEL - Abnormal; Notable for the following:    Chloride 94 (*)    CO2 36 (*)    Glucose, Bld 244 (*)    Calcium 8.1 (*)    GFR calc non Af Amer 70 (*)    GFR calc Af Amer 82 (*)    All other components within normal limits  CBC - Abnormal; Notable for the following:    WBC 13.0 (*)    Hemoglobin 8.1 (*)    HCT 29.2 (*)    MCV 73.0 (*)    MCH 20.3 (*)    MCHC 27.7 (*)    RDW 16.0 (*)    All other components within normal limits  BRAIN NATRIURETIC PEPTIDE - Abnormal; Notable for the following:    B Natriuretic Peptide 251.4 (*)    All other components within normal limits  PROTIME-INR - Abnormal; Notable for the following:    Prothrombin Time 21.3 (*)    INR 1.83 (*)    All other components within normal limits  I-STAT TROPOININ, ED    Imaging Review Dg Chest 2 View  05/29/2014   CLINICAL DATA:  Shortness of breath, recent pneumonia, COPD  EXAM: CHEST - 2 VIEW  COMPARISON:   05/18/2014  FINDINGS: Cardiomegaly evident with diffuse vascular and interstitial prominence and peripheral Kerley B lines, compatible with mild edema/ CHF. Minor basilar atelectasis. Stable nodular scarring along the right minor fissure peripherally. No pneumothorax. Trachea is midline. Lower cervical fusion hardware noted. Degenerative changes of the thoracic spine.  IMPRESSION: Cardiomegaly with mild interstitial edema pattern compatible with CHF.   Electronically Signed   By: Daryll Brod M.D.   On: 05/29/2014 12:09     EKG Interpretation   Date/Time:  Sunday May 29 2014 09:56:35 EST Ventricular Rate:  108 PR Interval:    QRS Duration: 82 QT Interval:  392 QTC Calculation: 525 R Axis:   -80 Text Interpretation:  Atrial fibrillation Left axis deviation Low voltage  QRS Cannot rule out Anteroseptal infarct , age undetermined Prolonged QT  Abnormal ECG artifact. No significant change was found Confirmed by  [facility]  MD, TREY ([id number]) on 05/29/2014 12:53:51 PM      MDM   Final diagnoses:  Acute congestive heart failure, unspecified congestive heart failure type  COPD bronchitis  Hyperglycemia   Patient presents with increase in dyspnea and lower extremity edema ongoing for several days. Recently decrease and Lasix cut in half by primary care for unknown reason. Patient has mild wheezing on exam, plan to give breathing treatment and reevaluate. Pulse ox with ambulation ordered. XR shows CHF, IV lasix ordered.   2:42 PM Reevaluation patient in room patient is extremely upset over care and lack of attention with help urinating, needing an oxygen extender. Attempted to diffuse the situation discussed how currently busy we are and multiple high acuity patients. And nursing staff is attempting to try either best but has had very sick patients. Patient continued to be upset stating this is a "sorry hospital" and requesting to be discharged. Stating that she can struggle to get up to  urinate at home. [name] SPO2 is 94% 3L with talking. [name] SPO2 with ambulation 90% on 3 L. 3:12 PM Apologized multiple times to the patient  about the 7 minutes to get an oxygen extender and how busy the ED has been, convince patient to stay to get further treatment and monitoring. Patient agrees. Discussed with Dr. Coralyn Pear, agrees to admit the patient. Meds given in ED:  Medications  ipratropium-albuterol (DUONEB) 0.5-2.5 (3) MG/3ML nebulizer solution 3 mL (3 mLs Nebulization Given 05/29/14 1314)  furosemide (LASIX) injection 80 mg (80 mg Intravenous Given 05/29/14 1327)    New Prescriptions   No medications on file     Harvie Heck, PA-C 05/29/14 1631  Artis Delay, MD 05/29/14 1819

## 2014-05-29 NOTE — ED Notes (Signed)
Pt states 2 weeks ago her PCP decreased her lasix to 20mg  bid. Was taking 40 mg bid. Feels like she is retaining fluid, SOB, dx with PNA. Is on home o2 at 3 liters

## 2014-05-29 NOTE — ED Provider Notes (Signed)
Medical screening examination/treatment/procedure(s) were conducted as a shared visit with non-physician practitioner(s) and myself.  I personally evaluated the patient during the encounter.   EKG Interpretation   Date/Time:  Sunday May 29 2014 09:56:35 EST Ventricular Rate:  108 PR Interval:    QRS Duration: 82 QT Interval:  392 QTC Calculation: 525 R Axis:   -80 Text Interpretation:  Atrial fibrillation Left axis deviation Low voltage  QRS Cannot rule out Anteroseptal infarct , age undetermined Prolonged QT  Abnormal ECG artifact. No significant change was found Confirmed by  Lucah Petta  MD, TREY ([id number]) on 05/29/2014 12:53:51 PM      63 yo female presenting with SOB, DOE, and lower extremitiy swelling for past several days, constant, worsening.  On exam, nontoxic, not distressed, lying on side, normal respiratory effort, normal perfusion, rales in bilateral bases and bilateral mid lung fields.   Presentation appears consistent with acute CHF exacerbation.  Given lasix.  Plan admission.    Clinical Impression: 1. Acute congestive heart failure, unspecified congestive heart failure type   2. SOB (shortness of breath)   3. COPD bronchitis   4. Hyperglycemia   5. COPD exacerbation       Artis Delay, MD 05/29/14 [phone]

## 2014-05-29 NOTE — ED Notes (Signed)
Added extension to oxygen tubing per patient request. Pt states when her husband returns she wants to leave. PA aware. Pt sitting on bedside commode at this time; will ambulated when finished

## 2014-05-30 ENCOUNTER — Observation Stay (HOSPITAL_COMMUNITY): Payer: Medicare Other

## 2014-05-30 DIAGNOSIS — J90 Pleural effusion, not elsewhere classified: Secondary | ICD-10-CM | POA: Diagnosis not present

## 2014-05-30 DIAGNOSIS — J449 Chronic obstructive pulmonary disease, unspecified: Secondary | ICD-10-CM | POA: Diagnosis not present

## 2014-05-30 DIAGNOSIS — I517 Cardiomegaly: Secondary | ICD-10-CM

## 2014-05-30 DIAGNOSIS — I509 Heart failure, unspecified: Secondary | ICD-10-CM | POA: Diagnosis not present

## 2014-05-30 DIAGNOSIS — J811 Chronic pulmonary edema: Secondary | ICD-10-CM | POA: Diagnosis not present

## 2014-05-30 LAB — BASIC METABOLIC PANEL
ANION GAP: 11 (ref 5–15)
BUN: 21 mg/dL (ref 6–23)
CALCIUM: 8 mg/dL — AB (ref 8.4–10.5)
CO2: 39 mmol/L — ABNORMAL HIGH (ref 19–32)
CREATININE: 0.91 mg/dL (ref 0.50–1.10)
Chloride: 88 mEq/L — ABNORMAL LOW (ref 96–112)
GFR, EST AFRICAN AMERICAN: 76 mL/min — AB (ref >90)
GFR, EST NON AFRICAN AMERICAN: 66 mL/min — AB (ref >90)
Glucose, Bld: 257 mg/dL — ABNORMAL HIGH (ref 70–99)
Potassium: 3.5 mmol/L (ref 3.5–5.1)
SODIUM: 138 mmol/L (ref 135–145)

## 2014-05-30 LAB — CBC
HEMATOCRIT: 28.5 % — AB (ref 36.0–46.0)
Hemoglobin: 7.9 g/dL — ABNORMAL LOW (ref 12.0–15.0)
MCH: 20.7 pg — ABNORMAL LOW (ref 26.0–34.0)
MCHC: 27.7 g/dL — AB (ref 30.0–36.0)
MCV: 74.8 fL — ABNORMAL LOW (ref 78.0–100.0)
PLATELETS: 325 10*3/uL (ref 150–400)
RBC: 3.81 MIL/uL — ABNORMAL LOW (ref 3.87–5.11)
RDW: 16.3 % — ABNORMAL HIGH (ref 11.5–15.5)
WBC: 15.9 10*3/uL — ABNORMAL HIGH (ref 4.0–10.5)

## 2014-05-30 LAB — FERRITIN: Ferritin: 32 ng/mL (ref 10–291)

## 2014-05-30 LAB — GLUCOSE, CAPILLARY
GLUCOSE-CAPILLARY: 138 mg/dL — AB (ref 70–99)
GLUCOSE-CAPILLARY: 330 mg/dL — AB (ref 70–99)
Glucose-Capillary: 252 mg/dL — ABNORMAL HIGH (ref 70–99)
Glucose-Capillary: 317 mg/dL — ABNORMAL HIGH (ref 70–99)
Glucose-Capillary: 302 mg/dL — ABNORMAL HIGH (ref 70–99)

## 2014-05-30 LAB — VITAMIN B12: Vitamin B-12: 653 pg/mL (ref 211–911)

## 2014-05-30 LAB — IRON AND TIBC
IRON: 24 ug/dL — AB (ref 42–145)
Saturation Ratios: 5 % — ABNORMAL LOW (ref 20–55)
TIBC: 481 ug/dL — ABNORMAL HIGH (ref 250–470)
UIBC: 457 ug/dL — ABNORMAL HIGH (ref 125–400)

## 2014-05-30 LAB — RETICULOCYTES
RBC.: 3.94 MIL/uL (ref 3.87–5.11)
RETIC COUNT ABSOLUTE: 86.7 10*3/uL (ref 19.0–186.0)
Retic Ct Pct: 2.2 % (ref 0.4–3.1)

## 2014-05-30 LAB — FOLATE: FOLATE: 10.9 ng/mL

## 2014-05-30 LAB — PROTIME-INR
INR: 1.84 — ABNORMAL HIGH (ref 0.00–1.49)
Prothrombin Time: 21.4 seconds — ABNORMAL HIGH (ref 11.6–15.2)

## 2014-05-30 MED ORDER — POTASSIUM CHLORIDE CRYS ER 20 MEQ PO TBCR
40.0000 meq | EXTENDED_RELEASE_TABLET | Freq: Every day | ORAL | Status: DC
  Administered 2014-05-30 – 2014-06-01 (×3): 40 meq via ORAL
  Filled 2014-05-30 (×4): qty 2

## 2014-05-30 MED ORDER — AZITHROMYCIN 250 MG PO TABS
250.0000 mg | ORAL_TABLET | Freq: Every day | ORAL | Status: DC
  Administered 2014-05-30 – 2014-06-02 (×4): 250 mg via ORAL
  Filled 2014-05-30 (×5): qty 1

## 2014-05-30 MED ORDER — FUROSEMIDE 10 MG/ML IJ SOLN
60.0000 mg | Freq: Two times a day (BID) | INTRAMUSCULAR | Status: DC
  Administered 2014-05-30: 60 mg via INTRAVENOUS
  Filled 2014-05-30 (×2): qty 6

## 2014-05-30 MED ORDER — WARFARIN SODIUM 4 MG PO TABS
4.0000 mg | ORAL_TABLET | Freq: Once | ORAL | Status: AC
  Administered 2014-05-30: 4 mg via ORAL
  Filled 2014-05-30: qty 1

## 2014-05-30 NOTE — Progress Notes (Signed)
Patient maintained on telemetry but the monitor is not able to pick up all of the patient's leadscondary to patient having an implanted bladder stimulator which interferes with the monitor. Therefore, lead 3 is the only lead that is readable at present time. Patient is in A-fib with a controlled heart rate of 77. Patient is visibly short of breath with minimal exertion and is maintained on continuous oxygen at 3 lpm via [place]. Will continue to monitor.  Esperanza Heir, RN

## 2014-05-30 NOTE — Care Management Note (Addendum)
  Page 2 of 2   06/02/2014     2:07:03 PM CARE MANAGEMENT NOTE 06/02/2014  Patient:  Denise Freeman, Denise Freeman   Account Number:  [id number]  Date Initiated:  05/30/2014  Documentation initiated by:  Shanira Tine  Subjective/Objective Assessment:   CHF, COPD Exacerbation, Obesity     Action/Plan:   CM to follow for disposition needs   Anticipated DC Date:  06/03/2014   Anticipated DC Plan:  [place]  CM consult      [facility] Choice  HOME HEALTH   Choice offered to / List presented to:  C-1 Patient   DME arranged  3-N-1      DME agency  [place] arranged  HH-1 RN  [place].   Status of service:  Completed, signed off Medicare Important Message given?  YES (If response is "NO", the following Medicare IM given date fields will be blank) Date Medicare IM given:  05/30/2014 Medicare IM given by:  Rashiya Lofland Date Additional Medicare IM given:  06/01/2014 Additional Medicare IM given by:  Ellan Lambert  Discharge Disposition:  [place]  Per UR Regulation:  Reviewed for med. necessity/level of care/duration of stay  If discussed at [place] of Stay Meetings, dates discussed:    Comments:  Janalee Grobe RN, BSN, MSHL, CCM  Nurse - Case Manager,  (Unit [place])  [phone]   06/02/2014 HHS:  RN, PT ([facility] / Harriette Ohara) DME Recs:  3n1 but patient has [facility]   06/01/14 Ellan Lambert, RN, BSN [phone] Pt lives with husband PTA; she is on chronic home oxygen at 3L/[place] provided by [employer].  She has RW, BSC and electric WC at home.  She is concerned about medication copays.Marland KitchenMarland Kitchenpt has Medicare and BCBS, and states copays are expensive and add up.  Pt given info on LIS (Low Income Subsidy) program through [employer] ([place]) for extra help with copays.   Pt has phone # to call to speak with  representative to apply for help.  she is appreciative.   She would benefit from [facility] at dc for disease mgmgt of CHF and COPD.  Will need PT consult.   Jaicey Sweaney RN, BSN, MSHL, CCM  Nurse - Case Manager,  (Unit [phone]   05/30/2014 Home O2 - active 3 L

## 2014-05-30 NOTE — Progress Notes (Signed)
2D Echocardiogram Complete.  05/30/2014   Denise Freeman, [place]

## 2014-05-30 NOTE — Progress Notes (Signed)
UR completed Azaria Bartell K. Hakeen Shipes, RN, BSN, MSHL, CCM  05/30/2014 11:13 AM

## 2014-05-30 NOTE — Progress Notes (Signed)
Patient Demographics  Denise Freeman, is a 63 y.o. female, DOB - 01-13-1951, [id number]  Admit date - 05/29/2014   Admitting Physician Kelvin Cellar, MD  Outpatient Primary MD for the patient is Pixie Casino, MD  LOS - 1   Chief Complaint  Patient presents with  . Shortness of Breath  . Congestive Heart Failure        Subjective:   Denise Freeman today has, No headache, No chest pain, No abdominal pain - No Nausea, No new weakness tingling or numbness, No Cough - Improved SOB.    Assessment & Plan    1. Acute on chronic respiratory failure due to acute on chronic CHF. No echo in chart. Much improved with IV Lasix for diuresis, continue fluid and salt restriction, will add low-dose Imdur and beta blocker. Daily weights, intake and output monitoring. Follow echo report.   2. Chronic COPD. On 3 L nasal cannula home oxygen, continue supportive care, no wheezing on exam, stop steroids. Taper down antibiotics to only azithromycin orally for 5 days.   3. Chronic leukocytosis. Nonspecific. Afebrile, no infiltrate on x-ray, could have mild URI. Taper down antibiotics to only azithromycin orally for 5 days.   4. Microcytic anemia. Check anemia panel. Follow with PCP as appropriate.   5. Dyslipidemia. Continue statin.   6. DM type II. Check A1c, on Lantus and sliding scale. Monitor after stopping steroids and adjust.  Lab Results  Component Value Date   HGBA1C 7.6* 05/13/2014    CBG (last 3)   Recent Labs  05/29/14 1650 05/29/14 2110 05/30/14 0611  GLUCAP 173* 179* 252*      7. Atrial fibrillation. Goal rate control, she is currently on Cardizem, Coumadin being monitored by pharmacy.     Code Status: Full  Family Communication: none  Disposition Plan:  Home   Procedures   TTE   Consults     Medications  Scheduled Meds: . azithromycin  250 mg Oral Daily  . diltiazem  30 mg Oral BID  . docusate sodium  100 mg Oral Daily  . escitalopram  10 mg Oral QHS  . famotidine  40 mg Oral BID  . fenofibrate  54 mg Oral Daily  . fluticasone  1 spray Each Nare BID  . furosemide  60 mg Intravenous BID  . gabapentin  900 mg Oral TID  . insulin aspart  0-20 Units Subcutaneous TID WC  . insulin aspart  0-5 Units Subcutaneous QHS  . insulin aspart  10 Units Subcutaneous Once  . insulin glargine  60 Units Subcutaneous QHS  . ipratropium-albuterol  3 mL Nebulization Q4H  . mirtazapine  15 mg Oral QHS  . pantoprazole  40 mg Oral Daily  . potassium chloride  40 mEq Oral Daily  . pregabalin  50 mg Oral TID  . rosuvastatin  20 mg Oral Daily  . sodium chloride  3 mL Intravenous Q12H  . Warfarin - Pharmacist Dosing Inpatient   Does not apply q1800   Continuous Infusions:  PRN Meds:.alum & mag hydroxide-simeth, morphine injection, [DISCONTINUED] ondansetron **OR** ondansetron (ZOFRAN) IV, oxyCODONE  DVT Prophylaxis  Coumadin  Lab Results  Component Value Date   INR 1.84* 05/30/2014   INR 1.83* 05/29/2014   INR 1.5  04/12/2014     Lab Results  Component Value Date   PLT 325 05/30/2014    Antibiotics     Anti-infectives    Start     Dose/Rate Route Frequency Ordered Stop   05/30/14 1000  azithromycin (ZITHROMAX) tablet 250 mg     250 mg Oral Daily 05/30/14 0808 06/04/14 0959   05/29/14 1800  cefTRIAXone (ROCEPHIN) 1 g in dextrose 5 % 50 mL IVPB - Premix  Status:  Discontinued     1 g100 mL/hr over 30 Minutes Intravenous Every 24 hours 05/29/14 1730 05/30/14 0808   05/29/14 1800  azithromycin (ZITHROMAX) 500 mg in dextrose 5 % 250 mL IVPB  Status:  Discontinued     500 mg250 mL/hr over 60 Minutes Intravenous Every 24 hours 05/29/14 1730 05/30/14 0808          Objective:   Filed Vitals:   05/30/14 0019 05/30/14 0211 05/30/14  0405 05/30/14 0614  BP:  129/45  153/97  Pulse:  86  103  Temp:  98 F (36.7 C)  97.1 F (36.2 C)  TempSrc:  Oral  Oral  Resp:  18  20  Height:      Weight:    102.785 kg (226 lb 9.6 oz)  SpO2: 98% 94% 98% 98%    Wt Readings from Last 3 Encounters:  05/30/14 102.785 kg (226 lb 9.6 oz)  05/18/14 96.616 kg (213 lb)  05/13/14 96.163 kg (212 lb)     Intake/Output Summary (Last 24 hours) at 05/30/14 0943 Last data filed at 05/30/14 [id number]  Gross per 24 hour  Intake    660 ml  Output   1600 ml  Net   -940 ml     Physical Exam  Awake Alert, Oriented X 3, No new F.N deficits, Normal affect [name],PERRAL Supple Neck,No JVD, No cervical lymphadenopathy appriciated.  Symmetrical Chest wall movement, Good air movement bilaterally, +ve rales RRR,No Gallops,Rubs or new Murmurs, No Parasternal Heave +ve B.Sounds, Abd Soft, No tenderness, No organomegaly appriciated, No rebound - guarding or rigidity. No Cyanosis, Clubbing , +ve leg edema, No new Rash or bruise      Data Review   Micro Results No results found for this or any previous visit (from the past 240 hour(s)).  Radiology Reports X-ray Chest Pa And Lateral  05/30/2014   CLINICAL DATA:  COPD.  EXAM: CHEST  2 VIEW  COMPARISON:  05/29/2014.  FINDINGS: Mediastinum hilar structures normal. Cardiomegaly pulmonary vascular prominence and interstitial prominence with bilateral pleural effusions. These findings are consistent with congestive heart failure and have progressed slightly from prior exam. No pneumothorax. Prior cervical spine fusion .  IMPRESSION: Congestive heart failure with pulmonary interstitial edema and bilateral small pleural effusions. Slight progression from prior study of 05/29/2014.   Electronically Signed   By: Marcello Moores  Register   On: 05/30/2014 09:33   Dg Chest 2 View  05/29/2014   CLINICAL DATA:  Shortness of breath, recent pneumonia, COPD  EXAM: CHEST - 2 VIEW  COMPARISON:  05/18/2014  FINDINGS: Cardiomegaly  evident with diffuse vascular and interstitial prominence and peripheral Kerley B lines, compatible with mild edema/ CHF. Minor basilar atelectasis. Stable nodular scarring along the right minor fissure peripherally. No pneumothorax. Trachea is midline. Lower cervical fusion hardware noted. Degenerative changes of the thoracic spine.  IMPRESSION: Cardiomegaly with mild interstitial edema pattern compatible with CHF.   Electronically Signed   By: Daryll Brod M.D.   On: 05/29/2014 12:09   Dg Chest  2 View  05/18/2014   CLINICAL DATA:  Productive cough  EXAM: CHEST  2 VIEW  COMPARISON:  05/06/2014  FINDINGS: Cardiomegaly again noted. Pleural parenchymal scarring right midlung is stable. Central mild bronchitic changes. There is streaky right basilar atelectasis or early infiltrate. Mild left basilar atelectasis. Mild degenerative changes thoracic spine.  IMPRESSION: Cardiomegaly. Worsening in aeration with streaky right basilar atelectasis or infiltrate. Central mild bronchitic changes.   Electronically Signed   By: Lahoma Crocker M.D.   On: 05/18/2014 19:06     CBC  Recent Labs Lab 05/29/14 1140 05/30/14 0604  WBC 13.0* 15.9*  HGB 8.1* 7.9*  HCT 29.2* 28.5*  PLT 333 325  MCV 73.0* 74.8*  MCH 20.3* 20.7*  MCHC 27.7* 27.7*  RDW 16.0* 16.3*    Chemistries   Recent Labs Lab 05/29/14 1140 05/30/14 0604  NA 136 138  K 4.2 3.5  CL 94* 88*  CO2 36* 39*  GLUCOSE 244* 257*  BUN 18 21  CREATININE 0.86 0.91  CALCIUM 8.1* 8.0*   ------------------------------------------------------------------------------------------------------------------ estimated creatinine clearance is 66.9 mL/min (by C-G formula based on Cr of 0.91). ------------------------------------------------------------------------------------------------------------------ No results for input(s): HGBA1C in the last 72  hours. ------------------------------------------------------------------------------------------------------------------ No results for input(s): CHOL, HDL, LDLCALC, TRIG, CHOLHDL, LDLDIRECT in the last 72 hours. ------------------------------------------------------------------------------------------------------------------ No results for input(s): TSH, T4TOTAL, T3FREE, THYROIDAB in the last 72 hours.  Invalid input(s): FREET3 ------------------------------------------------------------------------------------------------------------------ No results for input(s): VITAMINB12, FOLATE, FERRITIN, TIBC, IRON, RETICCTPCT in the last 72 hours.  Coagulation profile  Recent Labs Lab 05/29/14 1140 05/30/14 0604  INR 1.83* 1.84*    No results for input(s): DDIMER in the last 72 hours.  Cardiac Enzymes No results for input(s): CKMB, TROPONINI, MYOGLOBIN in the last 168 hours.  Invalid input(s): CK ------------------------------------------------------------------------------------------------------------------ Invalid input(s): POCBNP     Time Spent in minutes  35  Sarenity Ramaker K M.D on 05/30/2014 at 9:43 AM  Between 7am to 7pm - Pager - [phone]  After 7pm go to [email or web address] - [facility] Office  [phone]

## 2014-05-30 NOTE — Progress Notes (Signed)
ANTICOAGULATION CONSULT NOTE - Follow Up Consult  Pharmacy Consult for warfarin Indication: atrial fibrillation  Allergies  Allergen Reactions  . Ancef [Cefazolin]   . Augmentin [Amoxicillin-Pot Clavulanate]   . Ciprofloxacin   . Haldol [Haloperidol]   . Levaquin [Levofloxacin In D5w]   . Nsaids Diarrhea  . Tamiflu [Oseltamivir Phosphate]   . Zoloft [Sertraline Hcl]     Patient Measurements: Height: 4\' 11"  (149.9 cm) Weight: 226 lb 9.6 oz (102.785 kg) IBW/kg (Calculated) : 43.2  Vital Signs: Temp: 97.1 F (36.2 C) (12/28 0614) Temp Source: Oral (12/28 [id number]) BP: 153/97 mmHg (12/28 0614) Pulse Rate: 103 (12/28 0614)  Labs:  Recent Labs  05/29/14 1140 05/30/14 0604  HGB 8.1* 7.9*  HCT 29.2* 28.5*  PLT 333 325  LABPROT 21.3* 21.4*  INR 1.83* 1.84*  CREATININE 0.86 0.91    Estimated Creatinine Clearance: 66.9 mL/min (by C-G formula based on Cr of 0.91).   Assessment: 63 y/o female on chronic warfarin for Afib. INR is subtherapeutic at 1.84. No bleeding noted. Will give extra again today.  PTA: 2.5 mg daily except 1.25 mg Mon/Fri  Goal of Therapy:  INR 2-3 Monitor platelets by anticoagulation protocol: Yes   Plan:  - Repeat warfarin 4 mg PO tonight - INR daily - Monitor for s/sx of bleeding  [facility], Pharm.D., BCPS Clinical Pharmacist Pager: [phone] 05/30/2014 11:33 AM

## 2014-05-31 LAB — GLUCOSE, CAPILLARY
Glucose-Capillary: 236 mg/dL — ABNORMAL HIGH (ref 70–99)
Glucose-Capillary: 121 mg/dL — ABNORMAL HIGH (ref 70–99)
Glucose-Capillary: 215 mg/dL — ABNORMAL HIGH (ref 70–99)
Glucose-Capillary: 248 mg/dL — ABNORMAL HIGH (ref 70–99)

## 2014-05-31 LAB — BASIC METABOLIC PANEL
ANION GAP: 13 (ref 5–15)
BUN: 34 mg/dL — ABNORMAL HIGH (ref 6–23)
CALCIUM: 8 mg/dL — AB (ref 8.4–10.5)
CO2: 36 mmol/L — ABNORMAL HIGH (ref 19–32)
CREATININE: 1.12 mg/dL — AB (ref 0.50–1.10)
Chloride: 88 mEq/L — ABNORMAL LOW (ref 96–112)
GFR, EST AFRICAN AMERICAN: 59 mL/min — AB (ref >90)
GFR, EST NON AFRICAN AMERICAN: 51 mL/min — AB (ref >90)
Glucose, Bld: 231 mg/dL — ABNORMAL HIGH (ref 70–99)
Potassium: 4.1 mmol/L (ref 3.5–5.1)
Sodium: 137 mmol/L (ref 135–145)

## 2014-05-31 LAB — PROTIME-INR
INR: 2.2 — AB (ref 0.00–1.49)
PROTHROMBIN TIME: 24.6 s — AB (ref 11.6–15.2)

## 2014-05-31 LAB — MAGNESIUM: Magnesium: 2.3 mg/dL (ref 1.5–2.5)

## 2014-05-31 MED ORDER — POLYSACCHARIDE IRON COMPLEX 150 MG PO CAPS
150.0000 mg | ORAL_CAPSULE | Freq: Every day | ORAL | Status: DC
  Administered 2014-05-31 – 2014-06-02 (×3): 150 mg via ORAL
  Filled 2014-05-31 (×3): qty 1

## 2014-05-31 MED ORDER — FUROSEMIDE 10 MG/ML IJ SOLN
40.0000 mg | Freq: Every day | INTRAMUSCULAR | Status: DC
  Filled 2014-05-31 (×2): qty 4

## 2014-05-31 MED ORDER — WARFARIN SODIUM 5 MG PO TABS
5.0000 mg | ORAL_TABLET | Freq: Every day | ORAL | Status: DC
  Administered 2014-05-31: 5 mg via ORAL
  Filled 2014-05-31 (×2): qty 1

## 2014-05-31 MED ORDER — SODIUM CHLORIDE 0.9 % IV SOLN
125.0000 mg | Freq: Once | INTRAVENOUS | Status: AC
  Administered 2014-05-31: 125 mg via INTRAVENOUS
  Filled 2014-05-31 (×3): qty 10

## 2014-05-31 MED ORDER — SODIUM CHLORIDE 0.9 % IV SOLN
25.0000 mg | Freq: Once | INTRAVENOUS | Status: AC
  Administered 2014-05-31: 25 mg via INTRAVENOUS
  Filled 2014-05-31 (×3): qty 2

## 2014-05-31 MED ORDER — INSULIN GLARGINE 100 UNIT/ML ~~LOC~~ SOLN
70.0000 [IU] | Freq: Every day | SUBCUTANEOUS | Status: DC
Start: 2014-05-31 — End: 2014-06-02
  Administered 2014-05-31 – 2014-06-01 (×2): 70 [IU] via SUBCUTANEOUS
  Filled 2014-05-31 (×3): qty 0.7

## 2014-05-31 NOTE — Progress Notes (Signed)
MEDICATION RELATED CONSULT NOTE - INITIAL   Pharmacy Consult for Venofer or Substitute Indication: Iron Deficiency Anemia  Allergies  Allergen Reactions  . Ancef [Cefazolin]   . Augmentin [Amoxicillin-Pot Clavulanate]   . Ciprofloxacin   . Haldol [Haloperidol]   . Levaquin [Levofloxacin In D5w]   . Nsaids Diarrhea  . Tamiflu [Oseltamivir Phosphate]   . Zoloft [Sertraline Hcl]    Patient Measurements: Height: 4\' 11"  (149.9 cm) Weight: 229 lb 8 oz (104.1 kg) IBW/kg (Calculated) : 43.2  Vital Signs: Temp: 97.7 F (36.5 C) (12/29 0532) Temp Source: Oral (12/29 0532) BP: 102/71 mmHg (12/29 0532) Pulse Rate: 94 (12/29 0532) Intake/Output from previous day: 12/28 0701 - 12/29 0700 In: 1140 [P.O.:1140] Out: 1570 [Urine:1570] Intake/Output from this shift: Total I/O In: 720 [P.O.:720] Out: 200 [Urine:200]  Labs:  Recent Labs  05/29/14 1140 05/30/14 0604 05/31/14 0646  WBC 13.0* 15.9*  --   HGB 8.1* 7.9*  --   HCT 29.2* 28.5*  --   PLT 333 325  --   CREATININE 0.86 0.91 1.12*  MG  --   --  2.3   Estimated Creatinine Clearance: 54.9 mL/min (by C-G formula based on Cr of 1.12).  Microbiology: No results found for this or any previous visit (from the past 720 hour(s)).  Medical History: Past Medical History  Diagnosis Date  . Diabetes mellitus without complication   . Bipolar 1 disorder   . Fibromyalgia   . COPD (chronic obstructive pulmonary disease)   . CHF (congestive heart failure)   . Asthma   . Atrial fibrillation   . Neuropathy     Disc Back   . Fibromyalgia   . CHF (congestive heart failure)   . Coronary artery disease   . Anemia   . History of hiatal hernia   . GERD (gastroesophageal reflux disease)   . Myocardial infarction 1990  . Shortness of breath dyspnea    Assessment: 63yo female with multiple medical problems including iron deficiency anemia likely due to chronic disease.   We have been asked to dose her with Venofer or substitute.  Our  substitute is Nulecit (ferric gluconate).  Her iron panel reveals:   05/30/2014 11:27  Iron 24 (L)  UIBC 457 (H)  TIBC 481 (H)  Saturation Ratios 5 (L)  Ferritin 32  Folate 10.9  Vitamin B-12 653   Goal of Therapy:  Therapeutic response to IV iron replacement.  Plan:  - Begin with test dose of IV iron with 25mg  and then give 100mg  x1 - Begin daily dose of oral supplementation with Iron polysaccharides as this is generally tolerated better than the sulfate.  Rober Minion, PharmD., MS Clinical Pharmacist Pager:  [phone] Thank you for allowing pharmacy to be part of this patients care team. 05/31/2014,11:48 AM

## 2014-05-31 NOTE — Progress Notes (Signed)
ANTICOAGULATION CONSULT NOTE - Follow Up Consult  Pharmacy Consult for warfarin Indication: atrial fibrillation  Allergies  Allergen Reactions  . Ancef [Cefazolin]   . Augmentin [Amoxicillin-Pot Clavulanate]   . Ciprofloxacin   . Haldol [Haloperidol]   . Levaquin [Levofloxacin In D5w]   . Nsaids Diarrhea  . Tamiflu [Oseltamivir Phosphate]   . Zoloft [Sertraline Hcl]    atient Measurements: Height: 4\' 11"  (149.9 cm) Weight: 229 lb 8 oz (104.1 kg) IBW/kg (Calculated) : 43.2  Vital Signs: Temp: 97.7 F (36.5 C) (12/29 0532) Temp Source: Oral (12/29 0532) BP: 102/71 mmHg (12/29 0532) Pulse Rate: 94 (12/29 0532)  Labs:  Recent Labs  05/29/14 1140 05/30/14 0604 05/31/14 0646  HGB 8.1* 7.9*  --   HCT 29.2* 28.5*  --   PLT 333 325  --   LABPROT 21.3* 21.4* 24.6*  INR 1.83* 1.84* 2.20*  CREATININE 0.86 0.91 1.12*   Estimated Creatinine Clearance: 54.9 mL/min (by C-G formula based on Cr of 1.12).  Assessment: 63 y/o female on chronic warfarin for Afib. INR is now therapeutic at 2.2.  No bleeding noted and plans for discharge to home.    PTA: 2.5 mg daily except 1.25 mg Mon/Fri  Goal of Therapy:  INR 2-3 Monitor platelets by anticoagulation protocol: Yes   Plan:  - Will resume home regimen since she is back within goal but change to Warf. 2.5mg  daily - INR daily - Monitor for s/sx of bleeding  Rober Minion, PharmD., MS Clinical Pharmacist Pager:  [phone] Thank you for allowing pharmacy to be part of this patients care team. 05/31/2014 10:42 AM

## 2014-05-31 NOTE — Progress Notes (Signed)
Patient Demographics  Denise Freeman, is a 63 y.o. female, DOB - [birth year], [id number]  Admit date - 05/29/2014   Admitting Physician Kelvin Cellar, MD  Outpatient Primary MD for the patient is Pixie Casino, MD  LOS - 2   Chief Complaint  Patient presents with  . Shortness of Breath  . Congestive Heart Failure        Subjective:   Denise Freeman today has, No headache, No chest pain, No abdominal pain - No Nausea, No new weakness tingling or numbness, No Cough - Improved SOB.    Assessment & Plan    1. 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 No echo in chart. Much improved with IV Lasix for diuresis at reduced dose as creatinine mildly elevated, on Cardizem and not beta blocker due to COPD, continue fluid and salt restriction. Daily weights, intake and output monitoring.  ACE/ARB in the outpatient setting once creatinine is stable. Outpatient cardiology follow-up.   2. Chronic COPD. On 3 L nasal cannula home oxygen, continue supportive care, no wheezing on exam, stop steroids. Taper down antibiotics to only azithromycin orally for 5 days.   3. Chronic leukocytosis. Nonspecific. Afebrile, no infiltrate on x-ray, could have mild URI. Taper down antibiotics to only azithromycin orally for 5 days.   4. Microcytic anemia. Is iron deficient, will give IV Venofer or substitute, follow with PCP for iron deficiency anemia workup as appropriate.   5. Dyslipidemia. Continue statin.   6. DM type II. Check A1c, on Lantus dose increased continue sliding scale.    Lab Results  Component Value Date   HGBA1C 7.6* 05/13/2014    CBG (last 3)   Recent Labs  05/30/14 1653 05/30/14 2118 05/31/14 0636  GLUCAP 330* 302* 236*      7. Atrial  fibrillation. Goal rate control, she is currently on Cardizem, Coumadin being monitored by pharmacy.     Code Status: Full  Family Communication: none  Disposition Plan: Home   Procedures   TTE  - Left ventricle: The cavity size was normal. Wall thickness wasnormal. Systolic function was mildly reduced. The estimatedejection fraction was in the range of 45% to 50%. - Left atrium: The atrium was mildly dilated. - Right atrium: The atrium was mildly dilated.  Consults     Medications  Scheduled Meds: . azithromycin  250 mg Oral Daily  . diltiazem  30 mg Oral BID  . docusate sodium  100 mg Oral Daily  . escitalopram  10 mg Oral QHS  . famotidine  40 mg Oral BID  . fenofibrate  54 mg Oral Daily  . fluticasone  1 spray Each Nare BID  . [START ON 06/01/2014] furosemide  40 mg Intravenous Daily  . gabapentin  900 mg Oral TID  . insulin aspart  0-20 Units Subcutaneous TID WC  . insulin aspart  0-5 Units Subcutaneous QHS  . insulin aspart  10 Units Subcutaneous Once  . insulin glargine  70 Units Subcutaneous QHS  . ipratropium-albuterol  3 mL Nebulization Q4H  . mirtazapine  15 mg Oral QHS  . pantoprazole  40 mg Oral Daily  . potassium chloride  40 mEq Oral Daily  . pregabalin  50  mg Oral TID  . rosuvastatin  20 mg Oral Daily  . sodium chloride  3 mL Intravenous Q12H  . Warfarin - Pharmacist Dosing Inpatient   Does not apply q1800   Continuous Infusions:  PRN Meds:.alum & mag hydroxide-simeth, morphine injection, [DISCONTINUED] ondansetron **OR** ondansetron (ZOFRAN) IV, oxyCODONE  DVT Prophylaxis  Coumadin  Lab Results  Component Value Date   INR 2.20* 05/31/2014   INR 1.84* 05/30/2014   INR 1.83* 05/29/2014     Lab Results  Component Value Date   PLT 325 05/30/2014    Antibiotics     Anti-infectives    Start     Dose/Rate Route Frequency Ordered Stop   05/30/14 1000  azithromycin (ZITHROMAX) tablet 250 mg     250 mg Oral Daily 05/30/14 0808 06/04/14  0959   05/29/14 1800  cefTRIAXone (ROCEPHIN) 1 g in dextrose 5 % 50 mL IVPB - Premix  Status:  Discontinued     1 g100 mL/hr over 30 Minutes Intravenous Every 24 hours 05/29/14 1730 05/30/14 0808   05/29/14 1800  azithromycin (ZITHROMAX) 500 mg in dextrose 5 % 250 mL IVPB  Status:  Discontinued     500 mg250 mL/hr over 60 Minutes Intravenous Every 24 hours 05/29/14 1730 05/30/14 0808          Objective:   Filed Vitals:   05/31/14 0050 05/31/14 0100 05/31/14 0504 05/31/14 0532  BP:  115/68  102/71  Pulse:  97  94  Temp:  97.5 F (36.4 C)  97.7 F (36.5 C)  TempSrc:  Oral  Oral  Resp:  20  20  Height:      Weight:    104.1 kg (229 lb 8 oz)  SpO2: 98% 97% 97% 94%    Wt Readings from Last 3 Encounters:  05/31/14 104.1 kg (229 lb 8 oz)  05/18/14 96.616 kg (213 lb)  05/13/14 96.163 kg (212 lb)     Intake/Output Summary (Last 24 hours) at 05/31/14 1030 Last data filed at 05/31/14 0901  Gross per 24 hour  Intake   1260 ml  Output   1770 ml  Net   -510 ml     Physical Exam  Awake Alert, Oriented X 3, No new F.N deficits, Normal affect [place].AT,PERRAL Supple Neck,No JVD, No cervical lymphadenopathy appriciated.  Symmetrical Chest wall movement, Good air movement bilaterally, +ve rales RRR,No Gallops,Rubs or new Murmurs, No Parasternal Heave +ve B.Sounds, Abd Soft, No tenderness, No organomegaly appriciated, No rebound - guarding or rigidity. No Cyanosis, Clubbing , +ve leg edema, No new Rash or bruise      Data Review   Micro Results No results found for this or any previous visit (from the past 240 hour(s)).  Radiology Reports X-ray Chest Pa And Lateral  05/30/2014   CLINICAL DATA:  COPD.  EXAM: CHEST  2 VIEW  COMPARISON:  05/29/2014.  FINDINGS: Mediastinum hilar structures normal. Cardiomegaly pulmonary vascular prominence and interstitial prominence with bilateral pleural effusions. These findings are consistent with congestive heart failure and have progressed  slightly from prior exam. No pneumothorax. Prior cervical spine fusion .  IMPRESSION: Congestive heart failure with pulmonary interstitial edema and bilateral small pleural effusions. Slight progression from prior study of 05/29/2014.   Electronically Signed   By: Marcello Moores  Register   On: 05/30/2014 09:33   Dg Chest 2 View  05/29/2014   CLINICAL DATA:  Shortness of breath, recent pneumonia, COPD  EXAM: CHEST - 2 VIEW  COMPARISON:  05/18/2014  FINDINGS:  Cardiomegaly evident with diffuse vascular and interstitial prominence and peripheral Kerley B lines, compatible with mild edema/ CHF. Minor basilar atelectasis. Stable nodular scarring along the right minor fissure peripherally. No pneumothorax. Trachea is midline. Lower cervical fusion hardware noted. Degenerative changes of the thoracic spine.  IMPRESSION: Cardiomegaly with mild interstitial edema pattern compatible with CHF.   Electronically Signed   By: Daryll Brod M.D.   On: 05/29/2014 12:09   Dg Chest 2 View  05/18/2014   CLINICAL DATA:  Productive cough  EXAM: CHEST  2 VIEW  COMPARISON:  05/06/2014  FINDINGS: Cardiomegaly again noted. Pleural parenchymal scarring right midlung is stable. Central mild bronchitic changes. There is streaky right basilar atelectasis or early infiltrate. Mild left basilar atelectasis. Mild degenerative changes thoracic spine.  IMPRESSION: Cardiomegaly. Worsening in aeration with streaky right basilar atelectasis or infiltrate. Central mild bronchitic changes.   Electronically Signed   By: Lahoma Crocker M.D.   On: 05/18/2014 19:06     CBC  Recent Labs Lab 05/29/14 1140 05/30/14 0604  WBC 13.0* 15.9*  HGB 8.1* 7.9*  HCT 29.2* 28.5*  PLT 333 325  MCV 73.0* 74.8*  MCH 20.3* 20.7*  MCHC 27.7* 27.7*  RDW 16.0* 16.3*    Chemistries   Recent Labs Lab 05/29/14 1140 05/30/14 0604 05/31/14 0646  NA 136 138 137  K 4.2 3.5 4.1  CL 94* 88* 88*  CO2 36* 39* 36*  GLUCOSE 244* 257* 231*  BUN 18 21 34*    CREATININE 0.86 0.91 1.12*  CALCIUM 8.1* 8.0* 8.0*  MG  --   --  2.3   ------------------------------------------------------------------------------------------------------------------ estimated creatinine clearance is 54.9 mL/min (by C-G formula based on Cr of 1.12). ------------------------------------------------------------------------------------------------------------------ No results for input(s): HGBA1C in the last 72 hours. ------------------------------------------------------------------------------------------------------------------ No results for input(s): CHOL, HDL, LDLCALC, TRIG, CHOLHDL, LDLDIRECT in the last 72 hours. ------------------------------------------------------------------------------------------------------------------ No results for input(s): TSH, T4TOTAL, T3FREE, THYROIDAB in the last 72 hours.  Invalid input(s): FREET3 ------------------------------------------------------------------------------------------------------------------  Recent Labs  05/30/14 1127  VITAMINB12 653  FOLATE 10.9  FERRITIN 32  TIBC 481*  IRON 24*  RETICCTPCT 2.2    Coagulation profile  Recent Labs Lab 05/29/14 1140 05/30/14 0604 05/31/14 0646  INR 1.83* 1.84* 2.20*    No results for input(s): DDIMER in the last 72 hours.  Cardiac Enzymes No results for input(s): CKMB, TROPONINI, MYOGLOBIN in the last 168 hours.  Invalid input(s): CK ------------------------------------------------------------------------------------------------------------------ Invalid input(s): POCBNP     Time Spent in minutes  35  Jeron Grahn K M.D on 05/31/2014 at 10:30 AM  Between 7am to 7pm - Pager - [phone]  After 7pm go to [email or web address] - [facility] Office  [phone]

## 2014-05-31 NOTE — Progress Notes (Signed)
RT Note:  Patient has home CPAP set up at bedside with 3 lpm O2 bleed in.

## 2014-06-01 LAB — BASIC METABOLIC PANEL
Anion gap: 6 (ref 5–15)
BUN: 37 mg/dL — ABNORMAL HIGH (ref 6–23)
CHLORIDE: 96 meq/L (ref 96–112)
CO2: 39 mmol/L — AB (ref 19–32)
CREATININE: 1.14 mg/dL — AB (ref 0.50–1.10)
Calcium: 8 mg/dL — ABNORMAL LOW (ref 8.4–10.5)
GFR calc Af Amer: 58 mL/min — ABNORMAL LOW (ref >90)
GFR calc non Af Amer: 50 mL/min — ABNORMAL LOW (ref >90)
Glucose, Bld: 124 mg/dL — ABNORMAL HIGH (ref 70–99)
Potassium: 4.4 mmol/L (ref 3.5–5.1)
Sodium: 141 mmol/L (ref 135–145)

## 2014-06-01 LAB — GLUCOSE, CAPILLARY
GLUCOSE-CAPILLARY: 132 mg/dL — AB (ref 70–99)
GLUCOSE-CAPILLARY: 144 mg/dL — AB (ref 70–99)
Glucose-Capillary: 160 mg/dL — ABNORMAL HIGH (ref 70–99)
Glucose-Capillary: 99 mg/dL (ref 70–99)

## 2014-06-01 LAB — PROTIME-INR
INR: 2.41 — ABNORMAL HIGH (ref 0.00–1.49)
PROTHROMBIN TIME: 26.4 s — AB (ref 11.6–15.2)

## 2014-06-01 MED ORDER — POTASSIUM CHLORIDE CRYS ER 10 MEQ PO TBCR
10.0000 meq | EXTENDED_RELEASE_TABLET | Freq: Every day | ORAL | Status: DC
  Administered 2014-06-02: 10 meq via ORAL
  Filled 2014-06-01: qty 1

## 2014-06-01 MED ORDER — GUAIFENESIN-DM 100-10 MG/5ML PO SYRP
10.0000 mL | ORAL_SOLUTION | ORAL | Status: DC | PRN
  Administered 2014-06-01 – 2014-06-02 (×3): 10 mL via ORAL
  Filled 2014-06-01 (×3): qty 10

## 2014-06-01 MED ORDER — GUAIFENESIN-DM 100-10 MG/5ML PO SYRP: 5.0000 mL | ORAL_SOLUTION | ORAL | Status: DC | PRN

## 2014-06-01 MED ORDER — FUROSEMIDE 10 MG/ML IJ SOLN
40.0000 mg | Freq: Two times a day (BID) | INTRAMUSCULAR | Status: DC
  Administered 2014-06-01 (×2): 40 mg via INTRAVENOUS
  Filled 2014-06-01 (×2): qty 4

## 2014-06-01 MED ORDER — WARFARIN SODIUM 2.5 MG PO TABS
2.5000 mg | ORAL_TABLET | Freq: Once | ORAL | Status: AC
  Administered 2014-06-01: 2.5 mg via ORAL
  Filled 2014-06-01: qty 1

## 2014-06-01 NOTE — Progress Notes (Signed)
0730  Asleep ,arousable . Coherent , Respiration easy and regular . Pt wearing C pap mask % 3 L/[place]

## 2014-06-01 NOTE — Progress Notes (Signed)
ANTICOAGULATION CONSULT NOTE - Follow Up Consult  Pharmacy Consult for warfarin Indication: atrial fibrillation  Allergies  Allergen Reactions  . Ancef [Cefazolin]   . Augmentin [Amoxicillin-Pot Clavulanate]   . Ciprofloxacin   . Haldol [Haloperidol]   . Levaquin [Levofloxacin In D5w]   . Nsaids Diarrhea  . Tamiflu [Oseltamivir Phosphate]   . Zoloft [Sertraline Hcl]    atient Measurements: Height: 4\' 11"  (149.9 cm) Weight: 234 lb 12.6 oz (106.5 kg) IBW/kg (Calculated) : 43.2  Vital Signs: Temp: 97.5 F (36.4 C) (12/30 0444) Temp Source: Oral (12/30 0444) BP: 122/80 mmHg (12/30 0444) Pulse Rate: 91 (12/30 0444)  Labs:  Recent Labs  05/29/14 1140 05/30/14 0604 05/31/14 0646 06/01/14 0717 06/01/14 0812  HGB 8.1* 7.9*  --   --   --   HCT 29.2* 28.5*  --   --   --   PLT 333 325  --   --   --   LABPROT 21.3* 21.4* 24.6* 26.4*  --   INR 1.83* 1.84* 2.20* 2.41*  --   CREATININE 0.86 0.91 1.12*  --  1.14*   Estimated Creatinine Clearance: 54.6 mL/min (by C-G formula based on Cr of 1.14).  Assessment: 63 y/o female on chronic warfarin for Afib. INR is therapeutic at 2.41.  No bleeding noted, no new CBC.  PTA: 2.5 mg daily except 1.25 mg Mon/Fri  Goal of Therapy:  INR 2-3 Monitor platelets by anticoagulation protocol: Yes   Plan:  - Warfarin 2.5 mg PO tonight - INR daily - Monitor for s/sx of bleeding - Consider warfarin 2.5 mg daily upon discharge  [facility], [place].D., BCPS Clinical Pharmacist Pager: [phone] 06/01/2014 11:37 AM

## 2014-06-01 NOTE — Progress Notes (Signed)
1750 coughind med afforded some relief Pt able dinner with apeitteTalking on the phone with clear voice

## 2014-06-01 NOTE — Progress Notes (Signed)
Patient Demographics  Denise Freeman, is a 63 y.o. female, DOB - 01/21/1951, [id number]  Admit date - 05/29/2014   Admitting Physician Kelvin Cellar, MD  Outpatient Primary MD for the patient is Denise Casino, MD  LOS - 3   Chief Complaint  Patient presents with  . Shortness of Breath  . Congestive Heart Failure        Subjective:   Denise Freeman today has, No headache, No chest pain, No abdominal pain - No Nausea, No new weakness tingling or numbness, No Cough - Improved SOB.    Assessment & Plan    1. 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 No old echo in chart. Much improved with IV Lasix for diuresis (? Weight gain), on Cardizem and not beta blocker due to COPD, continue fluid and salt restriction. Daily weights, intake and output monitoring.  ACE/ARB in the outpatient setting once creatinine is stable. Outpatient cardiology follow-up.  Filed Weights   05/30/14 0614 05/31/14 0532 06/01/14 0444  Weight: 102.785 kg (226 lb 9.6 oz) 104.1 kg (229 lb 8 oz) 106.5 kg (234 lb 12.6 oz)    2. Chronic COPD. On 3 L nasal cannula home oxygen, continue supportive care, no wheezing on exam, stop steroids. Taper down antibiotics to only azithromycin orally for 5 days.   3. Chronic leukocytosis. Nonspecific. Afebrile, no infiltrate on x-ray, could have mild URI. Taper down antibiotics to only azithromycin orally for 5 days.   4. Microcytic anemia. Is iron deficient, will give IV Venofer or substitute, follow with PCP for iron deficiency anemia workup as appropriate.   5. Dyslipidemia. Continue statin.   6. DM type II. Noted A1c,   Lantus dose increased continue sliding scale.    Lab Results  Component Value Date   HGBA1C 7.6* 05/13/2014     CBG (last 3)   Recent Labs  05/31/14 1635 05/31/14 2132 06/01/14 0615  GLUCAP 215* 248* 160*      7. Atrial fibrillation. Goal rate control, she is currently on Cardizem, Coumadin being monitored by pharmacy.     Code Status: Full  Family Communication: none  Disposition Plan: Home   Procedures   TTE  - Left ventricle: The cavity size was normal. Wall thickness wasnormal. Systolic function was mildly reduced. The estimatedejection fraction was in the range of 45% to 50%. - Left atrium: The atrium was mildly dilated. - Right atrium: The atrium was mildly dilated.  Consults     Medications  Scheduled Meds: . azithromycin  250 mg Oral Daily  . diltiazem  30 mg Oral BID  . docusate sodium  100 mg Oral Daily  . escitalopram  10 mg Oral QHS  . famotidine  40 mg Oral BID  . fenofibrate  54 mg Oral Daily  . fluticasone  1 spray Each Nare BID  . furosemide  40 mg Intravenous BID  . gabapentin  900 mg Oral TID  . insulin aspart  0-20 Units Subcutaneous TID WC  . insulin aspart  0-5 Units Subcutaneous QHS  . insulin aspart  10 Units Subcutaneous Once  . insulin glargine  70 Units Subcutaneous QHS  . ipratropium-albuterol  3 mL Nebulization Q4H  . iron  polysaccharides  150 mg Oral Daily  . mirtazapine  15 mg Oral QHS  . pantoprazole  40 mg Oral Daily  . potassium chloride  40 mEq Oral Daily  . pregabalin  50 mg Oral TID  . rosuvastatin  20 mg Oral Daily  . sodium chloride  3 mL Intravenous Q12H  . warfarin  5 mg Oral q1800  . Warfarin - Pharmacist Dosing Inpatient   Does not apply q1800   Continuous Infusions:  PRN Meds:.alum & mag hydroxide-simeth, morphine injection, [DISCONTINUED] ondansetron **OR** ondansetron (ZOFRAN) IV, oxyCODONE  DVT Prophylaxis  Coumadin  Lab Results  Component Value Date   INR 2.41* 06/01/2014   INR 2.20* 05/31/2014   INR 1.84* 05/30/2014     Lab Results  Component Value Date   PLT 325 05/30/2014    Antibiotics      Anti-infectives    Start     Dose/Rate Route Frequency Ordered Stop   05/30/14 1000  azithromycin (ZITHROMAX) tablet 250 mg     250 mg Oral Daily 05/30/14 0808 06/04/14 0959   05/29/14 1800  cefTRIAXone (ROCEPHIN) 1 g in dextrose 5 % 50 mL IVPB - Premix  Status:  Discontinued     1 g100 mL/hr over 30 Minutes Intravenous Every 24 hours 05/29/14 1730 05/30/14 0808   05/29/14 1800  azithromycin (ZITHROMAX) 500 mg in dextrose 5 % 250 mL IVPB  Status:  Discontinued     500 mg250 mL/hr over 60 Minutes Intravenous Every 24 hours 05/29/14 1730 05/30/14 0808          Objective:   Filed Vitals:   05/31/14 2133 06/01/14 0351 06/01/14 0444 06/01/14 0749  BP: 131/70  122/80   Pulse: 96  91   Temp: 97.7 F (36.5 C)  97.5 F (36.4 C)   TempSrc: Oral  Oral   Resp: 20  20   Height:      Weight:   106.5 kg (234 lb 12.6 oz)   SpO2: 100% 99% 98% 91%    Wt Readings from Last 3 Encounters:  06/01/14 106.5 kg (234 lb 12.6 oz)  05/18/14 96.616 kg (213 lb)  05/13/14 96.163 kg (212 lb)     Intake/Output Summary (Last 24 hours) at 06/01/14 1010 Last data filed at 06/01/14 0924  Gross per 24 hour  Intake   1600 ml  Output    550 ml  Net   1050 ml     Physical Exam  Awake Alert, Oriented X 3, No new F.N deficits, Normal affect [place].AT,PERRAL Supple Neck,No JVD, No cervical lymphadenopathy appriciated.  Symmetrical Chest wall movement, Good air movement bilaterally, +ve rales RRR,No Gallops,Rubs or new Murmurs, No Parasternal Heave +ve B.Sounds, Abd Soft, No tenderness, No organomegaly appriciated, No rebound - guarding or rigidity. No Cyanosis, Clubbing , +ve leg edema, No new Rash or bruise      Data Review   Micro Results No results found for this or any previous visit (from the past 240 hour(s)).  Radiology Reports X-ray Chest Pa And Lateral  05/30/2014   CLINICAL DATA:  COPD.  EXAM: CHEST  2 VIEW  COMPARISON:  05/29/2014.  FINDINGS: Mediastinum hilar structures normal.  Cardiomegaly pulmonary vascular prominence and interstitial prominence with bilateral pleural effusions. These findings are consistent with congestive heart failure and have progressed slightly from prior exam. No pneumothorax. Prior cervical spine fusion .  IMPRESSION: Congestive heart failure with pulmonary interstitial edema and bilateral small pleural effusions. Slight progression from prior study of 05/29/2014.  Electronically Signed   By: Marcello Moores  Register   On: 05/30/2014 09:33   Dg Chest 2 View  05/29/2014   CLINICAL DATA:  Shortness of breath, recent pneumonia, COPD  EXAM: CHEST - 2 VIEW  COMPARISON:  05/18/2014  FINDINGS: Cardiomegaly evident with diffuse vascular and interstitial prominence and peripheral Kerley B lines, compatible with mild edema/ CHF. Minor basilar atelectasis. Stable nodular scarring along the right minor fissure peripherally. No pneumothorax. Trachea is midline. Lower cervical fusion hardware noted. Degenerative changes of the thoracic spine.  IMPRESSION: Cardiomegaly with mild interstitial edema pattern compatible with CHF.   Electronically Signed   By: Daryll Brod M.D.   On: 05/29/2014 12:09   Dg Chest 2 View  05/18/2014   CLINICAL DATA:  Productive cough  EXAM: CHEST  2 VIEW  COMPARISON:  05/06/2014  FINDINGS: Cardiomegaly again noted. Pleural parenchymal scarring right midlung is stable. Central mild bronchitic changes. There is streaky right basilar atelectasis or early infiltrate. Mild left basilar atelectasis. Mild degenerative changes thoracic spine.  IMPRESSION: Cardiomegaly. Worsening in aeration with streaky right basilar atelectasis or infiltrate. Central mild bronchitic changes.   Electronically Signed   By: Lahoma Crocker M.D.   On: 05/18/2014 19:06     CBC  Recent Labs Lab 05/29/14 1140 05/30/14 0604  WBC 13.0* 15.9*  HGB 8.1* 7.9*  HCT 29.2* 28.5*  PLT 333 325  MCV 73.0* 74.8*  MCH 20.3* 20.7*  MCHC 27.7* 27.7*  RDW 16.0* 16.3*    Chemistries     Recent Labs Lab 05/29/14 1140 05/30/14 0604 05/31/14 0646 06/01/14 0812  NA 136 138 137 141  K 4.2 3.5 4.1 4.4  CL 94* 88* 88* 96  CO2 36* 39* 36* 39*  GLUCOSE 244* 257* 231* 124*  BUN 18 21 34* 37*  CREATININE 0.86 0.91 1.12* 1.14*  CALCIUM 8.1* 8.0* 8.0* 8.0*  MG  --   --  2.3  --    ------------------------------------------------------------------------------------------------------------------ estimated creatinine clearance is 54.6 mL/min (by C-G formula based on Cr of 1.14). ------------------------------------------------------------------------------------------------------------------ No results for input(s): HGBA1C in the last 72 hours. ------------------------------------------------------------------------------------------------------------------ No results for input(s): CHOL, HDL, LDLCALC, TRIG, CHOLHDL, LDLDIRECT in the last 72 hours. ------------------------------------------------------------------------------------------------------------------ No results for input(s): TSH, T4TOTAL, T3FREE, THYROIDAB in the last 72 hours.  Invalid input(s): FREET3 ------------------------------------------------------------------------------------------------------------------  Recent Labs  05/30/14 1127  VITAMINB12 653  FOLATE 10.9  FERRITIN 32  TIBC 481*  IRON 24*  RETICCTPCT 2.2    Coagulation profile  Recent Labs Lab 05/29/14 1140 05/30/14 0604 05/31/14 0646 06/01/14 0717  INR 1.83* 1.84* 2.20* 2.41*    No results for input(s): DDIMER in the last 72 hours.  Cardiac Enzymes No results for input(s): CKMB, TROPONINI, MYOGLOBIN in the last 168 hours.  Invalid input(s): CK ------------------------------------------------------------------------------------------------------------------ Invalid input(s): POCBNP     Time Spent in minutes  35  Travious Vanover K M.D on 06/01/2014 at 10:10 AM  Between 7am to 7pm - Pager - [phone]  After 7pm go to  [email or web address] - [facility] Office  [phone]

## 2014-06-02 LAB — PROTIME-INR
INR: 2.73 — AB (ref 0.00–1.49)
PROTHROMBIN TIME: 29.1 s — AB (ref 11.6–15.2)

## 2014-06-02 LAB — MAGNESIUM: MAGNESIUM: 2.6 mg/dL — AB (ref 1.5–2.5)

## 2014-06-02 LAB — POTASSIUM: Potassium: 4.1 mmol/L (ref 3.5–5.1)

## 2014-06-02 LAB — GLUCOSE, CAPILLARY: Glucose-Capillary: 86 mg/dL (ref 70–99)

## 2014-06-02 MED ORDER — POLYSACCHARIDE IRON COMPLEX 150 MG PO CAPS: 150.0000 mg | ORAL_CAPSULE | Freq: Two times a day (BID) | ORAL | Status: DC

## 2014-06-02 MED ORDER — FUROSEMIDE 10 MG/ML IJ SOLN
60.0000 mg | Freq: Two times a day (BID) | INTRAMUSCULAR | Status: DC
  Administered 2014-06-02: 60 mg via INTRAVENOUS

## 2014-06-02 MED ORDER — FUROSEMIDE 40 MG PO TABS: 40.0000 mg | ORAL_TABLET | Freq: Three times a day (TID) | ORAL | Status: DC

## 2014-06-02 MED ORDER — PANTOPRAZOLE SODIUM 40 MG PO TBEC: 40.0000 mg | DELAYED_RELEASE_TABLET | Freq: Every day | ORAL | Status: DC

## 2014-06-02 MED ORDER — IPRATROPIUM-ALBUTEROL 0.5-2.5 (3) MG/3ML IN SOLN: 3.0000 mL | Freq: Four times a day (QID) | RESPIRATORY_TRACT | Status: DC

## 2014-06-02 MED ORDER — ATORVASTATIN CALCIUM 20 MG PO TABS
20.0000 mg | ORAL_TABLET | Freq: Every day | ORAL | Status: DC
Start: 2014-06-02 — End: 2015-10-31

## 2014-06-02 NOTE — Discharge Instructions (Signed)
Follow with Primary MD HILTY,Kenneth C, MD in 3 days   Get CBC, INR, CMP, checked  by Primary MD next visit.    Activity: As tolerated with Full fall precautions use walker/cane & assistance as needed   Disposition Home    Diet: Heart Healthy Low Carb. Check your Weight same time everyday, if you gain over 2 pounds, or you develop in leg swelling, experience more shortness of breath or chest pain, call your Primary MD immediately. Follow Cardiac Low Salt Diet and 1.5 lit/day fluid restriction.   On your next visit with your primary care physician please Get Medicines reviewed and adjusted.   Please request your Prim.MD to go over all Hospital Tests and Procedure/Radiological results at the follow up, please get all Hospital records sent to your Prim MD by signing hospital release before you go home.   If you experience worsening of your admission symptoms, develop shortness of breath, life threatening emergency, suicidal or homicidal thoughts you must seek medical attention immediately by calling 911 or calling your MD immediately  if symptoms less severe.  You Must read complete instructions/literature along with all the possible adverse reactions/side effects for all the Medicines you take and that have been prescribed to you. Take any new Medicines after you have completely understood and accpet all the possible adverse reactions/side effects.   Do not drive, operating heavy machinery, perform activities at heights, swimming or participation in water activities or provide baby sitting services if your were admitted for syncope or siezures until you have seen by Primary MD or a Neurologist and advised to do so again.  Do not drive when taking Pain medications.    Do not take more than prescribed Pain, Sleep and Anxiety Medications  Special Instructions: If you have smoked or chewed Tobacco  in the last 2 yrs please stop smoking, stop any regular Alcohol  and or any Recreational drug  use.  Wear Seat belts while driving.   Please note  You were cared for by a hospitalist during your hospital stay. If you have any questions about your discharge medications or the care you received while you were in the hospital after you are discharged, you can call the unit and asked to speak with the hospitalist on call if the hospitalist that took care of you is not available. Once you are discharged, your primary care physician will handle any further medical issues. Please note that NO REFILLS for any discharge medications will be authorized once you are discharged, as it is imperative that you return to your primary care physician (or establish a relationship with a primary care physician if you do not have one) for your aftercare needs so that they can reassess your need for medications and monitor your lab values.

## 2014-06-02 NOTE — Progress Notes (Signed)
Discharged orders were changed after patient was D/Ced by MD Candiss Norse ) sect called and will see that she gets the new orders printed

## 2014-06-02 NOTE — Evaluation (Signed)
Physical Therapy Evaluation Patient Details Name: Denise Freeman MRN: [id number] DOB: Sep 09, 1950 Today's Date: 06/02/2014   History of Present Illness  Denise Freeman is a 63 y.o. female with a past medical history morbid obesity, chronic hypoxemic respiratory failure requiring 3 L of supplemental oxygen at baseline, chronic obstructive pulmonary disease, congestive heart failure unknown ejection fraction, obesity hypoventilation syndrome admitted with complaints of shortness of breath.  Clinical Impression  Pt with orders for d/c home today.  PTA she had orders for OPPT due to receiving services from chronic pain clinic.  Recommend this continue to be her d/c plan.  Pt also reports the need for a new 3n1. No further acute PT services indicated.  PT signing off.    Follow Up Recommendations Outpatient PT;Supervision - Intermittent    Equipment Recommendations  3in1 (PT)    Recommendations for Other Services       Precautions / Restrictions Precautions Precautions: Fall      Mobility  Bed Mobility Overal bed mobility: Modified Independent                Transfers Overall transfer level: Modified independent Equipment used: None                Ambulation/Gait Ambulation/Gait assistance: Supervision Ambulation Distance (Feet): 40 Feet Assistive device: None Gait Pattern/deviations: Wide base of support;Decreased stride length;Step-through pattern   Gait velocity interpretation: Below normal speed for age/gender General Gait Details: distance limited by SOB  Stairs            Wheelchair Mobility    Modified Rankin (Stroke Patients Only)       Balance                                             Pertinent Vitals/Pain Pain Assessment: No/denies pain    Home Living Family/patient expects to be discharged to:: Private residence Living Arrangements: Spouse/significant other Available Help at Discharge: Family;Available 24  hours/day Type of Home: Mobile home Home Access: Stairs to enter Entrance Stairs-Rails: Right;Left;Can reach both Entrance Stairs-Number of Steps: 5 Home Layout: One level Home Equipment: Bedside commode;Electric scooter;Walker - 2 wheels;Cane - single point      Prior Function Level of Independence: Independent with assistive device(s)               Hand Dominance        Extremity/Trunk Assessment   Upper Extremity Assessment: Overall WFL for tasks assessed           Lower Extremity Assessment: Overall WFL for tasks assessed      Cervical / Trunk Assessment: Normal  Communication   Communication: No difficulties  Cognition Arousal/Alertness: Awake/alert Behavior During Therapy: WFL for tasks assessed/performed Overall Cognitive Status: Within Functional Limits for tasks assessed                      General Comments      Exercises        Assessment/Plan    PT Assessment All further PT needs can be met in the next venue of care  PT Diagnosis Difficulty walking   PT Problem List Decreased activity tolerance;Decreased mobility;Decreased strength;Cardiopulmonary status limiting activity  PT Treatment Interventions     PT Goals (Current goals can be found in the Care Plan section) Acute Rehab PT Goals PT Goal Formulation: All assessment and education complete,  DC therapy    Frequency     Barriers to discharge        Co-evaluation               End of Session Equipment Utilized During Treatment: Gait belt;Oxygen Activity Tolerance: Patient tolerated treatment well Patient left: in chair;with call bell/phone within reach Nurse Communication: Mobility status         Time: 0699-9672 PT Time Calculation (min) (ACUTE ONLY): 13 min   Charges:   PT Evaluation $Initial PT Evaluation Tier I: 1 Procedure PT Treatments $Gait Training: 8-22 mins   PT G Codes:        Lorriane Shire 06/02/2014, 11:44 AM

## 2014-06-02 NOTE — Discharge Summary (Addendum)
Denise Freeman, is a 63 y.o. female  DOB [birth year]  MRN [id number].  Admission date:  05/29/2014  Admitting Physician  Kelvin Cellar, MD  Discharge Date:  06/02/2014   Primary MD  Pixie Casino, MD  Recommendations for primary care physician for things to follow:   Monitor weight, INR, BMP and diuretic dose closely   Admission Diagnosis  SOB (shortness of breath) [R06.02] Hyperglycemia [R73.9] COPD bronchitis [J44.9] Acute congestive heart failure, unspecified congestive heart failure type [I50.9]   Discharge Diagnosis  SOB (shortness of breath) [R06.02] Hyperglycemia [R73.9] COPD bronchitis [J44.9] Acute congestive heart failure, unspecified congestive heart failure type [I50.9]     Principal Problem:   COPD exacerbation Active Problems:   Atrial fibrillation [I48.91]   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 Diabetes mellitus without complication   . Bipolar 1 disorder   . Fibromyalgia   . COPD (chronic obstructive pulmonary disease)   . CHF (congestive heart failure)   . Asthma   . Atrial fibrillation   . Neuropathy     Disc Back   . Fibromyalgia   . CHF (congestive heart failure)   . Coronary artery disease   . Anemia   . History of hiatal hernia   . GERD (gastroesophageal reflux disease)   . Myocardial infarction 1990  . Shortness of breath dyspnea     Past Surgical History  Procedure Laterality Date  . Heel spur surgery    . Appendectomy    . Hernia repair    . Bladder suspension      2003, 2006 and 2010  . Back surgery    . Neck surgery N/A 2009    4, 6, and 7 cervical disc replaced       History of present illness and  Hospital  Course:     Kindly see H&P for history of present illness and admission details, please review complete Labs, Consult reports and Test reports for all details in brief  HPI  from the history and physical done on the day of admission  Denise Freeman is a 63 y.o. female with a past medical history morbid obesity, chronic hypoxemic respiratory failure requiring 3 L of supplemental oxygen at baseline, chronic obstructive pulmonary disease, congestive heart failure unknown ejection fraction, obesity hypoventilation syndrome presenting to the emergency department with complaints of shortness of breath. She states symptoms started approximately 3 days ago with increasing shortness of breath from baseline that was associated with cough having yellow to green sputum production, along with increased wheezing. She also complains of increasing bilateral extremity edema. She denies fevers, chills, chest pain, hemoptysis, hematemesis, abdominal pain. Workup in the emergency department showed [facility] with mild interstitial edema pattern that could be consistent with acute CHF for which she was administered 80 mg of IV Lasix in the emergency room.  Hospital Course  1. 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  Much improved with IV Lasix for diuresis , symptom free, on Cardizem and not beta blocker due to COPD, continue fluid and salt restriction. Educated on Daily weights, intake and output monitoring. Home dose Lasix increased from 40 twice a day to 40 3 times a day, continue Zaroxolyn 3 times a week, follow with cardiologist closely within 3-4 days.    2. Chronic COPD. On 3 L nasal cannula home oxygen, continue supportive care, no wheezing on exam, stop steroids. Stop antibiotics.   3. Chronic leukocytosis. Nonspecific. Afebrile, no infiltrate on x-ray, could have mild URI. Repeat CBC and a 2 view chest x-ray in the outpatient setting in a week.   4.  Microcytic anemia. Start iron supplementation, follow with PCP for iron deficiency anemia workup as appropriate. advised one-time outpatient GI follow-up, as she is above 50 and on Coumadin as well.   5. Dyslipidemia. Continue statin.   6. DM type II. Noted A1c at 7.6. Continue home regimen and follow with PCP for better glycemic control.   7. Atrial fibrillation. Goal rate control, she is currently on Cardizem, Coumadin being monitored by pharmacy. Request PCP to continue monitoring INR.       Discharge Condition: stable   Follow UP  Follow-up Information    Follow up with HILTY,Kenneth C, MD. Schedule an appointment as soon as possible for a visit in 3 days.   Specialty:  Cardiology   Why:  Appt. Jan. 19 @ 1100 am officewill call if anything comes available   Contact information:   [place] [address] [phone]       Follow up with Silvano Rusk, MD. Schedule an appointment as soon as possible for a visit in 1 week.   Specialty:  Gastroenterology   Why:  Appt. nreeds to be made by Ms. Antrim   Contact information:   [address] [place] [address] [phone]         Discharge Instructions  and  Discharge Medications          Discharge Instructions    Discharge instructions    Complete by:  As directed   Follow with Primary MD Pixie Casino, MD in 3 days   Get CBC, INR, CMP, checked  by Primary MD next visit.    Activity: As tolerated with Full fall precautions use walker/cane & assistance as needed   Disposition Home    Diet: Heart Healthy Low Carb. Check your Weight same time everyday, if you gain over 2 pounds, or you develop in leg swelling, experience more shortness of breath or chest pain, call your Primary MD immediately. Follow Cardiac Low Salt Diet and 1.5 lit/day fluid restriction.   On your next visit with your primary care physician please Get Medicines reviewed and adjusted.   Please request  your Prim.MD to go over all Hospital Tests and Procedure/Radiological results at the follow up, please get all Hospital records sent to your Prim MD by signing hospital release before you go home.   If you experience worsening of your admission symptoms, develop shortness of breath, life threatening emergency, suicidal or homicidal thoughts you must seek medical attention immediately by calling 911 or calling your MD immediately  if symptoms less severe.  You Must read complete instructions/literature along with all the possible adverse reactions/side effects for all the Medicines you take and that have been prescribed to  you. Take any new Medicines after you have completely understood and accpet all the possible adverse reactions/side effects.   Do not drive, operating heavy machinery, perform activities at heights, swimming or participation in water activities or provide baby sitting services if your were admitted for syncope or siezures until you have seen by Primary MD or a Neurologist and advised to do so again.  Do not drive when taking Pain medications.    Do not take more than prescribed Pain, Sleep and Anxiety Medications  Special Instructions: If you have smoked or chewed Tobacco  in the last 2 yrs please stop smoking, stop any regular Alcohol  and or any Recreational drug use.  Wear Seat belts while driving.   Please note  You were cared for by a hospitalist during your hospital stay. If you have any questions about your discharge medications or the care you received while you were in the hospital after you are discharged, you can call the unit and asked to speak with the hospitalist on call if the hospitalist that took care of you is not available. Once you are discharged, your primary care physician will handle any further medical issues. Please note that NO REFILLS for any discharge medications will be authorized once you are discharged, as it is imperative that you return to your  primary care physician (or establish a relationship with a primary care physician if you do not have one) for your aftercare needs so that they can reassess your need for medications and monitor your lab values.     Increase activity slowly    Complete by:  As directed             Medication List    STOP taking these medications        azithromycin 250 MG tablet  Commonly known as:  ZITHROMAX Z-PAK     rosuvastatin 20 MG tablet  Commonly known as:  CRESTOR      TAKE these medications        ANORO ELLIPTA 62.5-25 MCG/INH Aepb  Generic drug:  Umeclidinium-Vilanterol  Inhale 2 puffs into the lungs once. Only open the device one time and then take your two separate drags to be sure you get it all     atorvastatin 20 MG tablet  Commonly known as:  LIPITOR  Take 1 tablet (20 mg total) by mouth daily.     cetirizine 10 MG tablet  Commonly known as:  ZYRTEC  Take 10 mg by mouth daily.     diltiazem 30 MG tablet  Commonly known as:  CARDIZEM  Take 1 tablet (30 mg total) by mouth 2 (two) times daily.     docusate sodium 100 MG capsule  Commonly known as:  COLACE  Take 100 mg by mouth daily.     escitalopram 10 MG tablet  Commonly known as:  LEXAPRO  Take 10 mg by mouth at bedtime.     famotidine 40 MG tablet  Commonly known as:  PEPCID  Take 40 mg by mouth 2 (two) times daily.     fenofibrate 54 MG tablet  Take 54 mg by mouth daily.     fluticasone 50 MCG/ACT nasal spray  Commonly known as:  FLONASE  Place 1 spray into both nostrils 2 (two) times daily.     furosemide 40 MG tablet  Commonly known as:  LASIX  Take 1 tablet (40 mg total) by mouth 3 (three) times daily.     gabapentin 300 MG  capsule  Commonly known as:  NEURONTIN  Take 900 mg by mouth 3 (three) times daily.     Insulin Glargine 300 UNIT/ML Sopn  Commonly known as:  TOUJEO SOLOSTAR  Inject 60 Units into the skin at bedtime.     insulin lispro 100 UNIT/ML KiwkPen  Commonly known as:  HUMALOG  KWIKPEN  Inject 0.16-0.22 mLs (16-22 Units total) into the skin 3 (three) times daily.     ipratropium 0.02 % nebulizer solution  Commonly known as:  ATROVENT  Take 0.5 mg by nebulization 2 (two) times daily.     iron polysaccharides 150 MG capsule  Commonly known as:  NIFEREX  Take 1 capsule (150 mg total) by mouth 2 (two) times daily.     metolazone 2.5 MG tablet  Commonly known as:  ZAROXOLYN  Take 2.5 mg by mouth 3 (three) times a week. Takes on Monday, Wed, and Fri     mirtazapine 15 MG tablet  Commonly known as:  REMERON  Take 15 mg by mouth at bedtime.     nystatin 100000 UNIT/GM Powd  Apply topically 2 (two) times daily.     ondansetron 4 MG disintegrating tablet  Commonly known as:  ZOFRAN-ODT  Take 4 mg by mouth every 8 (eight) hours as needed for nausea or vomiting.     oxyCODONE 5 MG immediate release tablet  Commonly known as:  Oxy IR/ROXICODONE  Take 5 mg by mouth 2 (two) times daily.     OXYGEN  Inhale into the lungs continuous. 3 liters 24/7     pantoprazole 40 MG tablet  Commonly known as:  PROTONIX  Take 1 tablet (40 mg total) by mouth daily.     potassium chloride SA 20 MEQ tablet  Commonly known as:  K-DUR,KLOR-CON  Take 40 mEq by mouth daily.     pregabalin 50 MG capsule  Commonly known as:  LYRICA  Take 50 mg by mouth 3 (three) times daily.     warfarin 2.5 MG tablet  Commonly known as:  COUMADIN  Take as directed by mouth daily per INR          Diet and Activity recommendation: See Discharge Instructions above   Consults obtained - none   Major procedures and Radiology Reports - PLEASE review detailed and final reports for all details, in brief -     TTE  - Left ventricle: The cavity size was normal. Wall thickness wasnormal. Systolic function was mildly reduced. The estimatedejection fraction was in the range of 45% to 50%. - Left atrium: The atrium was mildly dilated. - Right atrium: The atrium was mildly  dilated.   X-ray Chest Pa And Lateral  05/30/2014   CLINICAL DATA:  COPD.  EXAM: CHEST  2 VIEW  COMPARISON:  05/29/2014.  FINDINGS: Mediastinum hilar structures normal. Cardiomegaly pulmonary vascular prominence and interstitial prominence with bilateral pleural effusions. These findings are consistent with congestive heart failure and have progressed slightly from prior exam. No pneumothorax. Prior cervical spine fusion .  IMPRESSION: Congestive heart failure with pulmonary interstitial edema and bilateral small pleural effusions. Slight progression from prior study of 05/29/2014.   Electronically Signed   By: Marcello Moores  Register   On: 05/30/2014 09:33   Dg Chest 2 View  05/29/2014   CLINICAL DATA:  Shortness of breath, recent pneumonia, COPD  EXAM: CHEST - 2 VIEW  COMPARISON:  05/18/2014  FINDINGS: Cardiomegaly evident with diffuse vascular and interstitial prominence and peripheral Kerley B lines, compatible with mild  edema/ CHF. Minor basilar atelectasis. Stable nodular scarring along the right minor fissure peripherally. No pneumothorax. Trachea is midline. Lower cervical fusion hardware noted. Degenerative changes of the thoracic spine.  IMPRESSION: Cardiomegaly with mild interstitial edema pattern compatible with CHF.   Electronically Signed   By: Daryll Brod M.D.   On: 05/29/2014 12:09   Dg Chest 2 View  05/18/2014   CLINICAL DATA:  Productive cough  EXAM: CHEST  2 VIEW  COMPARISON:  05/06/2014  FINDINGS: Cardiomegaly again noted. Pleural parenchymal scarring right midlung is stable. Central mild bronchitic changes. There is streaky right basilar atelectasis or early infiltrate. Mild left basilar atelectasis. Mild degenerative changes thoracic spine.  IMPRESSION: Cardiomegaly. Worsening in aeration with streaky right basilar atelectasis or infiltrate. Central mild bronchitic changes.   Electronically Signed   By: Lahoma Crocker M.D.   On: 05/18/2014 19:06    Micro Results      No results found  for this or any previous visit (from the past 240 hour(s)).     Today   Subjective:   Denise Freeman today has no headache,no chest abdominal pain,no new weakness tingling or numbness, feels much better wants to go home today.    Objective:   Blood pressure 113/81, pulse 86, temperature 97.4 F (36.3 C), temperature source Oral, resp. rate 22, height 4\' 11"  (1.499 m), weight 106.1 kg (233 lb 14.5 oz), SpO2 100 %.   Intake/Output Summary (Last 24 hours) at 06/02/14 1148 Last data filed at 06/02/14 0839  Gross per 24 hour  Intake    540 ml  Output   1101 ml  Net   -561 ml    Exam Awake Alert, Oriented x 3, No new F.N deficits, Normal affect [place].AT,PERRAL Supple Neck,No JVD, No cervical lymphadenopathy appriciated.  Symmetrical Chest wall movement, Good air movement bilaterally, CTAB RRR,No Gallops,Rubs or new Murmurs, No Parasternal Heave +ve B.Sounds, Abd Soft, Non tender, No organomegaly appriciated, No rebound -guarding or rigidity. No Cyanosis, Clubbing , trace edema, No new Rash or bruise  Data Review   CBC w Diff:  Lab Results  Component Value Date   WBC 15.9* 05/30/2014   HGB 7.9* 05/30/2014   HCT 28.5* 05/30/2014   PLT 325 05/30/2014    CMP:  Lab Results  Component Value Date   NA 141 06/01/2014   K 4.1 06/02/2014   CL 96 06/01/2014   CO2 39* 06/01/2014   BUN 37* 06/01/2014   CREATININE 1.14* 06/01/2014   PROT 7.2 03/12/2014   ALBUMIN 2.7* 03/12/2014   BILITOT 0.3 03/12/2014   ALKPHOS 210* 03/12/2014   AST 15 03/12/2014   ALT 7 03/12/2014  .   Total Time in preparing paper work, data evaluation and todays exam - 35 minutes  Thurnell Lose M.D on 06/02/2014 at 11:48 AM  [facility] Office  [phone]

## 2014-06-06 DIAGNOSIS — G8929 Other chronic pain: Secondary | ICD-10-CM | POA: Diagnosis not present

## 2014-06-06 DIAGNOSIS — M4327 Fusion of spine, lumbosacral region: Secondary | ICD-10-CM | POA: Diagnosis not present

## 2014-06-06 DIAGNOSIS — M542 Cervicalgia: Secondary | ICD-10-CM | POA: Diagnosis not present

## 2014-06-06 DIAGNOSIS — M545 Low back pain: Secondary | ICD-10-CM | POA: Diagnosis not present

## 2014-06-06 DIAGNOSIS — M5136 Other intervertebral disc degeneration, lumbar region: Secondary | ICD-10-CM | POA: Diagnosis not present

## 2014-06-07 ENCOUNTER — Telehealth: Payer: Self-pay | Admitting: Diagnostic Radiology

## 2014-06-07 NOTE — Telephone Encounter (Signed)
I will sign orders when they are sent to the office. I'm in the office tomorrow. We don't give verbal orders for home health needs.  Dr. Lemmie Evens

## 2014-06-07 NOTE — Telephone Encounter (Signed)
The provider for this patient should have been Dr Marcello Moores A. [phone]

## 2014-06-07 NOTE — Telephone Encounter (Signed)
[facility] nurse called requesting verbal orders from our office for this patient.  Requesting social work consult for this patient due to patient recent relocation to the area, to help set her up w/ community resources. They would also use the social worker to set up [place] Directive.  Nurse also requested a home health aide for help w/ bathing & mobility assistance.

## 2014-06-09 ENCOUNTER — Telehealth: Payer: Self-pay | Admitting: *Deleted

## 2014-06-09 NOTE — Telephone Encounter (Signed)
Faxed order for PT eval & tx to [facility]

## 2014-06-13 ENCOUNTER — Encounter: Payer: Self-pay | Admitting: Internal Medicine

## 2014-06-13 ENCOUNTER — Ambulatory Visit (INDEPENDENT_AMBULATORY_CARE_PROVIDER_SITE_OTHER): Payer: Medicare Other | Admitting: Internal Medicine

## 2014-06-13 VITALS — BP 124/64 | HR 107 | Temp 97.9°F | Resp 16 | Wt 224.0 lb

## 2014-06-13 DIAGNOSIS — N183 Chronic kidney disease, stage 3 unspecified: Secondary | ICD-10-CM

## 2014-06-13 DIAGNOSIS — E1329 Other specified diabetes mellitus with other diabetic kidney complication: Secondary | ICD-10-CM

## 2014-06-13 DIAGNOSIS — IMO0002 Reserved for concepts with insufficient information to code with codable children: Secondary | ICD-10-CM

## 2014-06-13 DIAGNOSIS — E1365 Other specified diabetes mellitus with hyperglycemia: Principal | ICD-10-CM

## 2014-06-13 DIAGNOSIS — E1322 Other specified diabetes mellitus with diabetic chronic kidney disease: Secondary | ICD-10-CM

## 2014-06-13 MED ORDER — INSULIN LISPRO 100 UNIT/ML (KWIKPEN): 18.0000 [IU] | PEN_INJECTOR | Freq: Three times a day (TID) | SUBCUTANEOUS | Status: DC

## 2014-06-13 NOTE — Progress Notes (Signed)
Patient ID: Denise Freeman, female   DOB: 09-Jun-1950, 64 y.o.   MRN: [id number]  HPI: Denise Freeman is a 64 y.o.-year-old female, initially referred by her PCP, Dr. Loman Chroman, for management of DM2, dx in 2009, insulin-dependent in 2013, uncontrolled, with complications (CKD, DR). Last visit 1 mo ago.  She had A on CHF >> was on Prednisone for 1 week, on diuretics. She still cannot breath well. She will see Dr Halford Chessman next month.   Last hemoglobin A1c was: Lab Results  Component Value Date   HGBA1C 7.6* 05/13/2014   12/30/2013: HbA1c 8.6% 12/28/2013: HbA1c 7.9% HbA1c 8.7% HbA1c 9.4% HbA1c 7.3%  Pt is on a regimen of: - Toujeo 50 units qhs pen - HumaLog pen: 16 units before B 16 units before L 22 units before D We stopped Januvia at last visit.  Pt checks her sugars 4x a day and they are higher (Holidays, Prednisone): - am: 149-170 >> 158-191 - 2h after b'fast: n/c - before lunch: 158-199 >> 170-199 - 2h after lunch: n/c - before dinner: 169-205, 225 >> 168-200 - 2h after dinner: n/c - bedtime: 154-199 >> 162-200, last week 210-235 - nighttime:n/c No lows. Lowest sugar was 149; she has hypoglycemia awareness at 70 at 120. Highest sugar was 240.   Glucometer: FreeStyle  Pt's meals are: - Breakfast: toast with spread or oatmeal with splenda - Lunch: cottage cheese, or fruit - Dinner: chicken/beef/turkey with vegetables and a starch; macaroni and cheese - Snacks: 2 snacks   - + CKD stage 3, last BUN/creatinine:  Lab Results  Component Value Date   BUN 37* 06/01/2014   CREATININE 1.14* 06/01/2014  On Lisinopril, fenofibrate - last set of lipids: 09/07/2013: 180/135/31/138 Ob Crestor - last eye exam was in 2014. 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 She missed the appt with Dr Gershon Crane while in the hospital >> she will reschedule. - no numbness and tingling in her feet.  ROS: Constitutional: + weight gain, + fatigue, + hot flushes, + poor sleep, + nocturia Eyes: no blurry vision, no  xerophthalmia ENT: no sore throat, no nodules palpated in throat, no dysphagia/odynophagia, + hoarseness, + tinnitus, + hypoacusis Cardiovascular: + CP/+ SOB/+ palpitations/+ leg swelling Respiratory: + cough/+ SOB/+ wheezing Gastrointestinal: + N/no V/+ D/no C, + heartburn Musculoskeletal: + muscle aches/+ joint aches Skin: no rashes, + easy bruising Neurological: no tremors/numbness/tingling/dizziness  I reviewed pt's medications, allergies, PMH, social hx, family hx, and changes were documented in the history of present illness. Otherwise, unchanged from my initial visit note:  Past Medical History  Diagnosis Date  . Diabetes mellitus without complication   . Bipolar 1 disorder   . Fibromyalgia   . COPD (chronic obstructive pulmonary disease)   . CHF (congestive heart failure)   . Asthma   . Atrial fibrillation   . Neuropathy     Disc Back   . Fibromyalgia   . CHF (congestive heart failure)   . Coronary artery disease   . Anemia   . History of hiatal hernia   . GERD (gastroesophageal reflux disease)   . Myocardial infarction 1990  . Shortness of breath dyspnea    Past Surgical History  Procedure Laterality Date  . Heel spur surgery    . Appendectomy    . Hernia repair    . Bladder suspension      2003, 2006 and 2010  . Back surgery    . Neck surgery N/A 2009    4, 6, and 7 cervical disc replaced  History   Social History  . Marital Status: Married    Spouse Name: N/A    Number of Children: 1   Occupational History  . disabled   Social History Main Topics  . Smoking status: Former Smoker -- 2.00 packs/day for 14 years    Types: Cigarettes    Quit date: 04/12/1986  . Smokeless tobacco: Not on file  . Alcohol Use: No  . Drug Use: No   Current Outpatient Prescriptions on File Prior to Visit  Medication Sig Dispense Refill  . ANORO ELLIPTA 62.5-25 MCG/INH AEPB Inhale 2 puffs into the lungs once. Only open the device one time and then take your two separate  drags to be sure you get it all 60 each 11  . atorvastatin (LIPITOR) 20 MG tablet Take 1 tablet (20 mg total) by mouth daily. 30 tablet 1  . cetirizine (ZYRTEC) 10 MG tablet Take 10 mg by mouth daily.    Marland Kitchen diltiazem (CARDIZEM) 30 MG tablet Take 1 tablet (30 mg total) by mouth 2 (two) times daily. 180 tablet 1  . docusate sodium (COLACE) 100 MG capsule Take 100 mg by mouth daily.    Marland Kitchen escitalopram (LEXAPRO) 10 MG tablet Take 10 mg by mouth at bedtime.     . famotidine (PEPCID) 40 MG tablet Take 40 mg by mouth 2 (two) times daily.    . fenofibrate 54 MG tablet Take 54 mg by mouth daily.    . fluticasone (FLONASE) 50 MCG/ACT nasal spray Place 1 spray into both nostrils 2 (two) times daily.    . furosemide (LASIX) 40 MG tablet Take 1 tablet (40 mg total) by mouth 3 (three) times daily. 30 tablet 0  . gabapentin (NEURONTIN) 300 MG capsule Take 900 mg by mouth 3 (three) times daily.     . Insulin Glargine (TOUJEO SOLOSTAR) 300 UNIT/ML SOPN Inject 60 Units into the skin at bedtime. 9 pen 1  . insulin lispro (HUMALOG KWIKPEN) 100 UNIT/ML KiwkPen Inject 0.16-0.22 mLs (16-22 Units total) into the skin 3 (three) times daily. (Patient taking differently: Inject 16-22 Units into the skin 3 (three) times daily. <130 = 16 units <160 = 22 units) 45 mL 1  . ipratropium (ATROVENT) 0.02 % nebulizer solution Take 0.5 mg by nebulization 2 (two) times daily.    . iron polysaccharides (NIFEREX) 150 MG capsule Take 1 capsule (150 mg total) by mouth 2 (two) times daily. 60 capsule 0  . metolazone (ZAROXOLYN) 2.5 MG tablet Take 2.5 mg by mouth 3 (three) times a week. Takes on Monday, Wed, and Fri    . mirtazapine (REMERON) 15 MG tablet Take 15 mg by mouth at bedtime.    Marland Kitchen nystatin (MYCOSTATIN/NYSTOP) 100000 UNIT/GM POWD Apply topically 2 (two) times daily.    . ondansetron (ZOFRAN-ODT) 4 MG disintegrating tablet Take 4 mg by mouth every 8 (eight) hours as needed for nausea or vomiting.    Marland Kitchen oxyCODONE (OXY IR/ROXICODONE) 5  MG immediate release tablet Take 5 mg by mouth 2 (two) times daily.    . OXYGEN Inhale into the lungs continuous. 3 liters 24/7    . pantoprazole (PROTONIX) 40 MG tablet Take 1 tablet (40 mg total) by mouth daily. 30 tablet 0  . potassium chloride SA (K-DUR,KLOR-CON) 20 MEQ tablet Take 40 mEq by mouth daily.     . pregabalin (LYRICA) 50 MG capsule Take 50 mg by mouth 3 (three) times daily.    Marland Kitchen warfarin (COUMADIN) 2.5 MG tablet Take as directed  by mouth daily per INR (Patient taking differently: Take 1.25-2.5 mg by mouth every evening. Patient takes 1.25mg  on Monday and Friday and 2.5mg  all other days) 100 tablet 0   No current facility-administered medications on file prior to visit.   Allergies  Allergen Reactions  . Ancef [Cefazolin]   . Augmentin [Amoxicillin-Pot Clavulanate]   . Ciprofloxacin   . Haldol [Haloperidol]   . Levaquin [Levofloxacin In D5w]   . Nsaids Diarrhea  . Tamiflu [Oseltamivir Phosphate]   . Zoloft [Sertraline Hcl]    Family History  Problem Relation Age of Onset  . Heart disease Mother   . COPD Mother   . Cancer Mother     Breast cancer  . Diabetes Mother   . Heart disease Father   . COPD Sister   . Heart disease Sister   . Diabetes Sister   . Heart disease Maternal Grandmother    PE: BP 124/64 mmHg  Pulse 107  Temp(Src) 97.9 F (36.6 C) (Oral)  Resp 16  Wt 224 lb (101.606 kg)  SpO2 98% Body mass index is 45.22 kg/(m^2). Wt Readings from Last 3 Encounters:  06/13/14 224 lb (101.606 kg)  06/02/14 233 lb 14.5 oz (106.1 kg)  05/18/14 213 lb (96.616 kg)   Constitutional: obese, in wheelchair, in NAD, on oxygen Eyes: PERRLA, EOMI, no exophthalmos ENT: moist mucous membranes, no thyromegaly, no cervical lymphadenopathy Cardiovascular: RRR, No MRG Respiratory: + wheezing upper anterior pulm. fields Gastrointestinal: abdomen soft, NT, ND, BS+ Musculoskeletal: no deformities, strength intact in all 4 Skin: moist, warm, no rashes Neurological: no  tremor with outstretched hands, DTR normal in all 4  ASSESSMENT: 1. DM2, insulin-dependent, uncontrolled, with complications - CKD stage 3 - DR  PLAN:  1. Patient with long-standing, uncontrolled diabetes (but last HbA1c not far from goal), on basal-bolus insulin regimen which is insufficient. Sugars are higher after the Holidays and with her recent Prednisone course. - we will need to increase her Toujeo and Humalog doses and will also add a SSI:  Patient Instructions  - Please increase Toujeo to 60 units. If sugars in am not <150 in 5 days, please increase to 65 units. - Please increase  HumaLog to: 18 units before B 18 units before L 24 units before D Please add the following sliding scale of Humalog - 150-175: + 1 unit  - 176-200: + 2 units  - 201-225: + 3 units  - 226-250: + 4 units  - 251-275: + 5 units - >275: + 6 units  Please return in 1.5 month with your sugar log.   - Strongly advised her to continue checking sugars at different times of the day - check 4 times a day, rotating checks - given more sugar logs - advised for yearly eye exams >> needs one >> will schedule - Return to clinic in 1.5 mo with sugar log

## 2014-06-13 NOTE — Patient Instructions (Addendum)
-   Please increase Toujeo to 60 units. If sugars in am not <150 in 5 days, please increase to 65 units. - Please increase  HumaLog to: 18 units before B 18 units before L 24 units before D Please add the following sliding scale of Humalog - 150-175: + 1 unit  - 176-200: + 2 units  - 201-225: + 3 units  - 226-250: + 4 units  - 251-275: + 5 units - >275: + 6 units  Please return in 1.5 month with your sugar log.

## 2014-06-17 DIAGNOSIS — Z23 Encounter for immunization: Secondary | ICD-10-CM | POA: Diagnosis not present

## 2014-06-17 DIAGNOSIS — J449 Chronic obstructive pulmonary disease, unspecified: Secondary | ICD-10-CM | POA: Diagnosis not present

## 2014-06-17 DIAGNOSIS — R609 Edema, unspecified: Secondary | ICD-10-CM | POA: Diagnosis not present

## 2014-06-17 DIAGNOSIS — I509 Heart failure, unspecified: Secondary | ICD-10-CM | POA: Diagnosis not present

## 2014-06-17 DIAGNOSIS — L309 Dermatitis, unspecified: Secondary | ICD-10-CM | POA: Diagnosis not present

## 2014-06-17 DIAGNOSIS — M797 Fibromyalgia: Secondary | ICD-10-CM | POA: Diagnosis not present

## 2014-06-17 DIAGNOSIS — G4730 Sleep apnea, unspecified: Secondary | ICD-10-CM | POA: Diagnosis not present

## 2014-06-17 DIAGNOSIS — I10 Essential (primary) hypertension: Secondary | ICD-10-CM | POA: Diagnosis not present

## 2014-06-17 DIAGNOSIS — R32 Unspecified urinary incontinence: Secondary | ICD-10-CM | POA: Diagnosis not present

## 2014-06-21 ENCOUNTER — Ambulatory Visit: Payer: Medicare Other | Admitting: Physician Assistant

## 2014-06-28 ENCOUNTER — Other Ambulatory Visit: Payer: Self-pay

## 2014-06-28 DIAGNOSIS — Z1231 Encounter for screening mammogram for malignant neoplasm of breast: Secondary | ICD-10-CM

## 2014-07-01 ENCOUNTER — Ambulatory Visit: Payer: Medicare Other | Admitting: Urology

## 2014-07-06 ENCOUNTER — Institutional Professional Consult (permissible substitution): Payer: Medicare Other | Admitting: Pulmonary Disease

## 2014-07-07 ENCOUNTER — Ambulatory Visit: Payer: Medicare Other

## 2014-07-07 DIAGNOSIS — M79604 Pain in right leg: Secondary | ICD-10-CM | POA: Diagnosis not present

## 2014-07-07 DIAGNOSIS — G8929 Other chronic pain: Secondary | ICD-10-CM | POA: Diagnosis not present

## 2014-07-07 DIAGNOSIS — M25562 Pain in left knee: Secondary | ICD-10-CM | POA: Diagnosis not present

## 2014-07-07 DIAGNOSIS — M79605 Pain in left leg: Secondary | ICD-10-CM | POA: Diagnosis not present

## 2014-07-07 DIAGNOSIS — M25561 Pain in right knee: Secondary | ICD-10-CM | POA: Diagnosis not present

## 2014-07-07 DIAGNOSIS — M545 Low back pain: Secondary | ICD-10-CM | POA: Diagnosis not present

## 2014-07-08 ENCOUNTER — Ambulatory Visit: Payer: Medicare Other

## 2014-07-08 ENCOUNTER — Telehealth: Payer: Self-pay | Admitting: Pharmacist Clinician (PhC)/ Clinical Pharmacy Specialist

## 2014-07-08 ENCOUNTER — Telehealth: Payer: Self-pay | Admitting: *Deleted

## 2014-07-08 MED ORDER — RIVAROXABAN 20 MG PO TABS: 20.0000 mg | ORAL_TABLET | Freq: Every day | ORAL | Status: DC

## 2014-07-08 NOTE — Telephone Encounter (Signed)
Faxed signed [facility] orders

## 2014-07-08 NOTE — Telephone Encounter (Signed)
Switched pt to Xarelto 20 mg qd with supper.  Pt was d/c from hospital 1 month ago on warfarin.  Due to physical limitations has not been able to get INR checks.  Reviewed with [name]

## 2014-07-12 ENCOUNTER — Telehealth: Payer: Self-pay | Admitting: Internal Medicine

## 2014-07-13 NOTE — Telephone Encounter (Signed)
Closed encounter °

## 2014-07-18 ENCOUNTER — Encounter: Payer: Self-pay | Admitting: Internal Medicine

## 2014-07-18 ENCOUNTER — Ambulatory Visit (INDEPENDENT_AMBULATORY_CARE_PROVIDER_SITE_OTHER): Payer: Medicare Other | Admitting: Internal Medicine

## 2014-07-18 VITALS — BP 116/62 | HR 103 | Ht 59.0 in | Wt 218.2 lb

## 2014-07-18 DIAGNOSIS — J9612 Chronic respiratory failure with hypercapnia: Secondary | ICD-10-CM

## 2014-07-18 DIAGNOSIS — I5032 Chronic diastolic (congestive) heart failure: Secondary | ICD-10-CM

## 2014-07-18 DIAGNOSIS — I4891 Unspecified atrial fibrillation: Secondary | ICD-10-CM | POA: Diagnosis not present

## 2014-07-18 DIAGNOSIS — M797 Fibromyalgia: Secondary | ICD-10-CM | POA: Diagnosis not present

## 2014-07-18 DIAGNOSIS — R600 Localized edema: Secondary | ICD-10-CM

## 2014-07-18 MED ORDER — RIVAROXABAN 20 MG PO TABS: 20.0000 mg | ORAL_TABLET | Freq: Every day | ORAL | Status: DC

## 2014-07-18 MED ORDER — FUROSEMIDE 40 MG PO TABS: 40.0000 mg | ORAL_TABLET | Freq: Three times a day (TID) | ORAL | Status: DC

## 2014-07-18 NOTE — Patient Instructions (Addendum)
Your physician wants you to follow-up in: 6 month with [name]. You will receive a reminder letter in the mail two months in advance. If you don't receive a letter, please call our office to schedule the follow-up appointment.  Your medications (furosemide & xarelto) have been refilled.   [name] has ordered compression stockings - please wear these during the day.  Right Ankle 10 in Right Calf 16.5 in  Left Ankle 10 in Left Clag 15.5 in

## 2014-07-18 NOTE — Progress Notes (Signed)
OFFICE NOTE  Chief Complaint:  Hospital follow-up   Primary Care Physician: London Pepper, MD  HPI:  Denise Freeman is a pleasant 64 year old female who is establishing cardiac care today. She is accompanied by her husband and they recently moved here from [place], [place]. She has a history of severe COPD on home oxygen. She also has a history of stress-induced cardiomyopathy in the past. She has since had recovery of her EF. She's had numerous cardiac catheterizations. None of which showed obstructive coronary disease. Her last cardiac catheterization was in October 2014 which demonstrated no significant coronary disease. This was after a small abnormality was noted in the apex suggestive of ischemia on a nuclear stress test. She does have a history of permanent atrial fibrillation on Coumadin. She will need to have her INRs followed here. Unfortunate she has not had her INR checked in over 9 weeks and it was assessed today and was low. We will need to adjust her medication. She will be established in our anticoagulation clinic. Blood pressure looks well controlled today. She is on cholesterol medication.  I saw Denise Freeman back today in the office. Unfortunate she was hospitalized in December for worsening shortness of breath and probable pneumonia with a mild diastolic heart failure exacerbation. She was given IV diuretics and her Lasix was increased to 40 mg 3 times a day. She's also taking metolazone 3 times weekly. She continues to complain of leg edema which is mostly dependent. She has significant shortness of breath and CO2 retention and an appointment with a pulmonologist is pending.  PMHx:  Past Medical History  Diagnosis Date  . Diabetes mellitus without complication   . Bipolar 1 disorder   . Fibromyalgia   . COPD (chronic obstructive pulmonary disease)   . CHF (congestive heart failure)   . Asthma   . Atrial fibrillation   . Neuropathy     Disc Back   . Fibromyalgia   .  CHF (congestive heart failure)   . Coronary artery disease   . Anemia   . History of hiatal hernia   . GERD (gastroesophageal reflux disease)   . Myocardial infarction 1990  . Shortness of breath dyspnea     Past Surgical History  Procedure Laterality Date  . Heel spur surgery    . Appendectomy    . Hernia repair    . Bladder suspension      2003, 2006 and 2010  . Back surgery    . Neck surgery N/A 2009    4, 6, and 7 cervical disc replaced    FAMHx:  Family History  Problem Relation Age of Onset  . Heart disease Mother   . COPD Mother   . Cancer Mother     Breast cancer  . Diabetes Mother   . Heart disease Father   . COPD Sister   . Heart disease Sister   . Diabetes Sister   . Heart disease Maternal Grandmother     SOCHx:   reports that she quit smoking about 28 years ago. Her smoking use included Cigarettes. She has a 28 pack-year smoking history. She does not have any smokeless tobacco history on file. She reports that she does not drink alcohol or use illicit drugs.  ALLERGIES:  Allergies  Allergen Reactions  . Ancef [Cefazolin]   . Augmentin [Amoxicillin-Pot Clavulanate]   . Ciprofloxacin   . Haldol [Haloperidol]   . Levaquin [Levofloxacin In D5w]   . Nsaids Diarrhea  .  Tamiflu [Oseltamivir Phosphate]   . Zoloft [Sertraline Hcl]     ROS: A comprehensive review of systems was negative except for: Respiratory: positive for dyspnea on exertion Cardiovascular: positive for lower extremity edema Musculoskeletal: positive for myalgias  HOME MEDS: Current Outpatient Prescriptions  Medication Sig Dispense Refill  . atorvastatin (LIPITOR) 20 MG tablet Take 1 tablet (20 mg total) by mouth daily. 30 tablet 1  . cetirizine (ZYRTEC) 10 MG tablet Take 10 mg by mouth daily.    Marland Kitchen diltiazem (CARDIZEM) 30 MG tablet Take 1 tablet (30 mg total) by mouth 2 (two) times daily. 180 tablet 1  . docusate sodium (COLACE) 100 MG capsule Take 100 mg by mouth daily.    Marland Kitchen  escitalopram (LEXAPRO) 10 MG tablet Take 10 mg by mouth at bedtime.     . famotidine (PEPCID) 40 MG tablet Take 40 mg by mouth 2 (two) times daily.    . fenofibrate 54 MG tablet Take 54 mg by mouth daily.    . ferrous sulfate 325 (65 FE) MG tablet Take 325 mg by mouth 2 (two) times daily with a meal.    . fluticasone (FLONASE) 50 MCG/ACT nasal spray Place 1 spray into both nostrils 2 (two) times daily.    . furosemide (LASIX) 40 MG tablet Take 1 tablet (40 mg total) by mouth 3 (three) times daily. 270 tablet 1  . gabapentin (NEURONTIN) 300 MG capsule Take 900 mg by mouth 3 (three) times daily.     . Insulin Glargine (TOUJEO SOLOSTAR) 300 UNIT/ML SOPN Inject 60 Units into the skin at bedtime. 9 pen 1  . insulin lispro (HUMALOG KWIKPEN) 100 UNIT/ML KiwkPen Inject 0.18-0.3 mLs (18-30 Units total) into the skin 3 (three) times daily. 45 mL 1  . ipratropium (ATROVENT) 0.02 % nebulizer solution Take 0.5 mg by nebulization 2 (two) times daily.    . iron polysaccharides (NIFEREX) 150 MG capsule Take 1 capsule (150 mg total) by mouth 2 (two) times daily. 60 capsule 0  . metolazone (ZAROXOLYN) 2.5 MG tablet Take 2.5 mg by mouth 3 (three) times a week. Takes on Monday, Wed, and Fri    . mirtazapine (REMERON) 15 MG tablet Take 15 mg by mouth at bedtime.    Marland Kitchen nystatin (MYCOSTATIN/NYSTOP) 100000 UNIT/GM POWD Apply topically 2 (two) times daily.    . ondansetron (ZOFRAN-ODT) 4 MG disintegrating tablet Take 4 mg by mouth every 8 (eight) hours as needed for nausea or vomiting.    Marland Kitchen oxyCODONE (OXY IR/ROXICODONE) 5 MG immediate release tablet Take 5 mg by mouth 2 (two) times daily.    . OXYGEN Inhale into the lungs continuous. 3 liters 24/7    . pantoprazole (PROTONIX) 40 MG tablet Take 1 tablet (40 mg total) by mouth daily. 30 tablet 0  . potassium chloride SA (K-DUR,KLOR-CON) 20 MEQ tablet Take 40 mEq by mouth daily.     . pregabalin (LYRICA) 50 MG capsule Take 50 mg by mouth 3 (three) times daily.    .  rivaroxaban (XARELTO) 20 MG TABS tablet Take 1 tablet (20 mg total) by mouth daily with supper. 90 tablet 1   No current facility-administered medications for this visit.    LABS/IMAGING: No results found for this or any previous visit (from the past 48 hour(s)). No results found.  VITALS: BP 116/62 mmHg  Pulse 103  Ht 4\' 11"  (1.499 m)  Wt 218 lb 3.2 oz (98.975 kg)  BMI 44.05 kg/m2  EXAM: General appearance: alert, appears older than  stated age and no distress Neck: no carotid bruit and no JVD Lungs: diminished breath sounds bilaterally and wheezes bilaterally Heart: regular rate and rhythm, S1, S2 normal, no murmur, click, rub or gallop Abdomen: soft, non-tender; bowel sounds normal; no masses,  no organomegaly and obese Extremities: edema trace bilateral Pulses: 2+ and symmetric Skin: pale, cool, dryt Neurologic: Grossly normal Psych: Somewhat manic  EKG: Atrial fibrillation with rapid ventricular response at 103  ASSESSMENT: 1. Permanent atrial fibrillation on warfarin 2. History of stress-induced cardiomyopathy with normalization of her EF 3. No significant obstructive coronary disease after multiple catheterizations in 1996, 99, 2007 and 2014. 4. Fibromyalgia 5. Bipolar 1 disorder 6. Dyslipidemia 7. Hypertension 8. Severe COPD on home oxygen 9. Obstructive sleep apnea on CPAP  PLAN: 1.   Denise Freeman has pretty significant pulmonary disease on oxygen as well as sleep apnea and diastolic heart failure. She recently had an exacerbation and responded to IV Lasix but also was noted to have a pneumonia. She's currently on 40 mg Lasix 3 times a day with metolazone 3 times weekly. She still complaining of leg swelling which I think mostly dependent edema. I recommended compression stockings we measured this for her in the office today. She reportedly got a pair from her family physician however they were a zip type and were too small for her to get on.  Plan to see her back  in 6 months.  Pixie Casino, MD, [facility] Attending Cardiologist [facility]  Lydie Stammen C 07/18/2014, 4:21 PM

## 2014-07-20 DIAGNOSIS — R600 Localized edema: Secondary | ICD-10-CM | POA: Diagnosis not present

## 2014-07-20 DIAGNOSIS — E1342 Other specified diabetes mellitus with diabetic polyneuropathy: Secondary | ICD-10-CM | POA: Diagnosis not present

## 2014-07-20 DIAGNOSIS — L609 Nail disorder, unspecified: Secondary | ICD-10-CM | POA: Diagnosis not present

## 2014-07-20 DIAGNOSIS — M79674 Pain in right toe(s): Secondary | ICD-10-CM | POA: Diagnosis not present

## 2014-07-22 ENCOUNTER — Institutional Professional Consult (permissible substitution): Payer: Medicare Other | Admitting: Internal Medicine

## 2014-07-25 ENCOUNTER — Telehealth: Payer: Self-pay | Admitting: Internal Medicine

## 2014-07-25 ENCOUNTER — Encounter: Payer: Self-pay | Admitting: Internal Medicine

## 2014-07-25 ENCOUNTER — Ambulatory Visit (INDEPENDENT_AMBULATORY_CARE_PROVIDER_SITE_OTHER): Payer: Medicare Other | Admitting: Internal Medicine

## 2014-07-25 VITALS — BP 118/64 | HR 106 | Temp 97.6°F | Resp 16

## 2014-07-25 DIAGNOSIS — L97929 Non-pressure chronic ulcer of unspecified part of left lower leg with unspecified severity: Secondary | ICD-10-CM | POA: Diagnosis not present

## 2014-07-25 DIAGNOSIS — IMO0002 Reserved for concepts with insufficient information to code with codable children: Secondary | ICD-10-CM

## 2014-07-25 DIAGNOSIS — L97919 Non-pressure chronic ulcer of unspecified part of right lower leg with unspecified severity: Secondary | ICD-10-CM | POA: Diagnosis not present

## 2014-07-25 DIAGNOSIS — N183 Chronic kidney disease, stage 3 unspecified: Secondary | ICD-10-CM

## 2014-07-25 DIAGNOSIS — E1329 Other specified diabetes mellitus with other diabetic kidney complication: Secondary | ICD-10-CM | POA: Diagnosis not present

## 2014-07-25 DIAGNOSIS — E1365 Other specified diabetes mellitus with hyperglycemia: Principal | ICD-10-CM

## 2014-07-25 DIAGNOSIS — E1322 Other specified diabetes mellitus with diabetic chronic kidney disease: Secondary | ICD-10-CM

## 2014-07-25 MED ORDER — INSULIN PEN NEEDLE 32G X 4 MM MISC: Status: DC

## 2014-07-25 NOTE — Progress Notes (Signed)
Patient ID: Denise Freeman, female   DOB: 11/25/1950, 64 y.o.   MRN: [id number]  HPI: Denise Freeman is a 64 y.o.-year-old female, initially referred by her PCP, Dr. Loman Chroman, for management of DM2, dx in 2009, insulin-dependent in 2013, uncontrolled, with complications (CKD, DR). Last visit 1.5 mo ago.  She finished the Prednisone - 5-6 weeks ago.   She has several painful wheeping ulcers on shin.  Last hemoglobin A1c was: Lab Results  Component Value Date   HGBA1C 7.6* 05/13/2014   12/30/2013: HbA1c 8.6% 12/28/2013: HbA1c 7.9% HbA1c 8.7% HbA1c 9.4% HbA1c 7.3%  Pt is on a regimen of: - Toujeo 50 >> 60 >> 65 units qhs pen - HumaLog pen: 18 units before B 18 units before L 24 units before D - sliding scale of Humalog - added 06/2014.  - 150-175: + 1 unit  - 176-200: + 2 units  - 201-225: + 3 units  - 226-250: + 4 units  - 251-275: + 5 units - >275: + 6 units  We stopped Januvia at last visit.  Pt checks her sugars 4x a day and they are better (no log0: - am: 149-170 >> 158-191 >> 160-175, 180 - 2h after b'fast: n/c - before lunch: 158-199 >> 170-199 >> 130-150 - 2h after lunch: n/c - before dinner: 169-205, 225 >> 168-200 >> 175-190  - 2h after dinner: n/c - bedtime: 154-199 >> 162-200, last week 210-235 >> 165-190 - nighttime:n/c No lows. Lowest sugar was 130; she has hypoglycemia awareness at 70 at 120. Highest sugar was 240 >> 190.   Glucometer: FreeStyle  Pt's meals are: - Breakfast: toast with spread or oatmeal with splenda - Lunch: cottage cheese, or fruit - Dinner: chicken/beef/turkey with vegetables and a starch; macaroni and cheese - Snacks: 2 snacks   - + CKD stage 3, last BUN/creatinine:  Lab Results  Component Value Date   BUN 37* 06/01/2014   CREATININE 1.14* 06/01/2014  On Lisinopril, fenofibrate - last set of lipids: 09/07/2013: 180/135/31/138 Ob Crestor - last eye exam was in 2014. 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 She missed the appt with Dr Gershon Crane while in the  hospital >> she will reschedule. - no numbness and tingling in her feet.  ROS: Constitutional: + weight loss, + fatigue, + hot flushes, + poor sleep, + nocturia Eyes: no blurry vision, no xerophthalmia ENT: no sore throat, no nodules palpated in throat, no dysphagia/odynophagia, no hoarseness, + hypoacusis Cardiovascular: + CP/+ SOB/+ palpitations/+ leg swelling Respiratory: no cough/+ SOB/+ wheezing Gastrointestinal: + N/no V/+ D/no C, + heartburn Musculoskeletal: + muscle aches/+ joint aches Skin: no rashes, + easy bruising Neurological: no tremors/numbness/tingling/dizziness, + HA  I reviewed pt's medications, allergies, PMH, social hx, family hx, and changes were documented in the history of present illness. Otherwise, unchanged from my initial visit note:  Past Medical History  Diagnosis Date  . Diabetes mellitus without complication   . Bipolar 1 disorder   . Fibromyalgia   . COPD (chronic obstructive pulmonary disease)   . CHF (congestive heart failure)   . Asthma   . Atrial fibrillation   . Neuropathy     Disc Back   . Fibromyalgia   . CHF (congestive heart failure)   . Coronary artery disease   . Anemia   . History of hiatal hernia   . GERD (gastroesophageal reflux disease)   . Myocardial infarction 1990  . Shortness of breath dyspnea    Past Surgical History  Procedure Laterality Date  . Heel spur  surgery    . Appendectomy    . Hernia repair    . Bladder suspension      2003, 2006 and 2010  . Back surgery    . Neck surgery N/A 2009    4, 6, and 7 cervical disc replaced   History   Social History  . Marital Status: Married    Spouse Name: N/A    Number of Children: 1   Occupational History  . disabled   Social History Main Topics  . Smoking status: Former Smoker -- 2.00 packs/day for 14 years    Types: Cigarettes    Quit date: 04/12/1986  . Smokeless tobacco: Not on file  . Alcohol Use: No  . Drug Use: No   Current Outpatient Prescriptions on  File Prior to Visit  Medication Sig Dispense Refill  . atorvastatin (LIPITOR) 20 MG tablet Take 1 tablet (20 mg total) by mouth daily. 30 tablet 1  . cetirizine (ZYRTEC) 10 MG tablet Take 10 mg by mouth daily.    Marland Kitchen diltiazem (CARDIZEM) 30 MG tablet Take 1 tablet (30 mg total) by mouth 2 (two) times daily. 180 tablet 1  . docusate sodium (COLACE) 100 MG capsule Take 100 mg by mouth daily.    Marland Kitchen escitalopram (LEXAPRO) 10 MG tablet Take 10 mg by mouth at bedtime.     . famotidine (PEPCID) 40 MG tablet Take 40 mg by mouth 2 (two) times daily.    . fenofibrate 54 MG tablet Take 54 mg by mouth daily.    . ferrous sulfate 325 (65 FE) MG tablet Take 325 mg by mouth 2 (two) times daily with a meal.    . fluticasone (FLONASE) 50 MCG/ACT nasal spray Place 1 spray into both nostrils 2 (two) times daily.    . furosemide (LASIX) 40 MG tablet Take 1 tablet (40 mg total) by mouth 3 (three) times daily. 270 tablet 1  . gabapentin (NEURONTIN) 300 MG capsule Take 900 mg by mouth 3 (three) times daily.     . Insulin Glargine (TOUJEO SOLOSTAR) 300 UNIT/ML SOPN Inject 60 Units into the skin at bedtime. 9 pen 1  . insulin lispro (HUMALOG KWIKPEN) 100 UNIT/ML KiwkPen Inject 0.18-0.3 mLs (18-30 Units total) into the skin 3 (three) times daily. 45 mL 1  . ipratropium (ATROVENT) 0.02 % nebulizer solution Take 0.5 mg by nebulization 2 (two) times daily.    . iron polysaccharides (NIFEREX) 150 MG capsule Take 1 capsule (150 mg total) by mouth 2 (two) times daily. 60 capsule 0  . metolazone (ZAROXOLYN) 2.5 MG tablet Take 2.5 mg by mouth 3 (three) times a week. Takes on Monday, Wed, and Fri    . mirtazapine (REMERON) 15 MG tablet Take 15 mg by mouth at bedtime.    Marland Kitchen nystatin (MYCOSTATIN/NYSTOP) 100000 UNIT/GM POWD Apply topically 2 (two) times daily.    . ondansetron (ZOFRAN-ODT) 4 MG disintegrating tablet Take 4 mg by mouth every 8 (eight) hours as needed for nausea or vomiting.    Marland Kitchen oxyCODONE (OXY IR/ROXICODONE) 5 MG  immediate release tablet Take 5 mg by mouth 2 (two) times daily.    . OXYGEN Inhale into the lungs continuous. 3 liters 24/7    . pantoprazole (PROTONIX) 40 MG tablet Take 1 tablet (40 mg total) by mouth daily. 30 tablet 0  . potassium chloride SA (K-DUR,KLOR-CON) 20 MEQ tablet Take 40 mEq by mouth daily.     . pregabalin (LYRICA) 50 MG capsule Take 50 mg by mouth  3 (three) times daily.    . rivaroxaban (XARELTO) 20 MG TABS tablet Take 1 tablet (20 mg total) by mouth daily with supper. 90 tablet 1   No current facility-administered medications on file prior to visit.   Allergies  Allergen Reactions  . Ancef [Cefazolin]   . Augmentin [Amoxicillin-Pot Clavulanate]   . Ciprofloxacin   . Haldol [Haloperidol]   . Levaquin [Levofloxacin In D5w]   . Nsaids Diarrhea  . Tamiflu [Oseltamivir Phosphate]   . Zoloft [Sertraline Hcl]    Family History  Problem Relation Age of Onset  . Heart disease Mother   . COPD Mother   . Cancer Mother     Breast cancer  . Diabetes Mother   . Heart disease Father   . COPD Sister   . Heart disease Sister   . Diabetes Sister   . Heart disease Maternal Grandmother    PE: BP 118/64 mmHg  Pulse 106  Temp(Src) 97.6 F (36.4 C) (Oral)  Resp 16  SpO2 96% There is no weight on file to calculate BMI. Wt Readings from Last 3 Encounters:  07/18/14 218 lb 3.2 oz (98.975 kg)  06/13/14 224 lb (101.606 kg)  06/02/14 233 lb 14.5 oz (106.1 kg)   Constitutional: obese, in wheelchair, in NAD, on oxygen Eyes: PERRLA, EOMI, no exophthalmos ENT: moist mucous membranes, no thyromegaly, no cervical lymphadenopathy Cardiovascular: irreg. Irreg. , No MRG Respiratory: CTA B Gastrointestinal: abdomen soft, NT, ND, BS+ Musculoskeletal: no deformities, strength intact in all 4 Skin: moist, warm, + rash - small ulcers (1 cm) on shins - bilaterally (wheeping) Neurological: no tremor with outstretched hands, DTR normal in all 4  ASSESSMENT: 1. DM2, insulin-dependent,  uncontrolled, with complications - CKD stage 3 - DR  2. Leg ulcers  PLAN:  1. Patient with long-standing, uncontrolled diabetes (but last HbA1c not far from goal), on basal-bolus insulin regimen - with improved control after increasing mealtime insulin. Sugars before dinner are higher >> she remains high after dinner >> then in am. Will increase her lunchtime Humalog. I also congratulated the pt for her weight loss! - I advised her to:  Patient Instructions  - Please continue Toujeo 65 units. - Please increase  Humalog to: 18 units before B 22 units before L  24 units before D Please add the following sliding scale of Humalog - 150-175: + 1 unit  - 176-200: + 2 units  - 201-225: + 3 units  - 226-250: + 4 units  - 251-275: + 5 units - >275: + 6 units  Please return in 1.5 month with your sugar log.  - Strongly advised her to continue checking sugars at different times of the day - check 4 times a day, rotating checks - given more sugar logs - advised for yearly eye exams >> needs one! - Return to clinic in 1.5 mo with sugar log   2. Leg ulcers - they appear vascular - tried triple AB ointment - will refer to Wound care

## 2014-07-25 NOTE — Patient Instructions (Signed)
-   Please continue Toujeo 65 units. - Please increase  HumaLog to: 18 units before B 22 units before L  24 units before D Please add the following sliding scale of Humalog - 150-175: + 1 unit  - 176-200: + 2 units  - 201-225: + 3 units  - 226-250: + 4 units  - 251-275: + 5 units - >275: + 6 units  Please return in 1.5 month with your sugar log.

## 2014-07-26 ENCOUNTER — Emergency Department (HOSPITAL_COMMUNITY)
Admission: EM | Admit: 2014-07-26 | Discharge: 2014-07-26 | Disposition: A | Discharge status: Home / Self Care | Payer: Medicare Other | Attending: Emergency Medicine | Admitting: Emergency Medicine

## 2014-07-26 ENCOUNTER — Encounter (HOSPITAL_COMMUNITY): Payer: Self-pay | Admitting: Emergency Medicine

## 2014-07-26 DIAGNOSIS — I4891 Unspecified atrial fibrillation: Secondary | ICD-10-CM | POA: Insufficient documentation

## 2014-07-26 DIAGNOSIS — F319 Bipolar disorder, unspecified: Secondary | ICD-10-CM | POA: Diagnosis not present

## 2014-07-26 DIAGNOSIS — I509 Heart failure, unspecified: Secondary | ICD-10-CM | POA: Diagnosis not present

## 2014-07-26 DIAGNOSIS — E119 Type 2 diabetes mellitus without complications: Secondary | ICD-10-CM | POA: Diagnosis not present

## 2014-07-26 DIAGNOSIS — M797 Fibromyalgia: Secondary | ICD-10-CM | POA: Diagnosis not present

## 2014-07-26 DIAGNOSIS — Z7951 Long term (current) use of inhaled steroids: Secondary | ICD-10-CM | POA: Diagnosis not present

## 2014-07-26 DIAGNOSIS — R21 Rash and other nonspecific skin eruption: Secondary | ICD-10-CM | POA: Diagnosis not present

## 2014-07-26 DIAGNOSIS — Z79899 Other long term (current) drug therapy: Secondary | ICD-10-CM | POA: Diagnosis not present

## 2014-07-26 DIAGNOSIS — Z794 Long term (current) use of insulin: Secondary | ICD-10-CM | POA: Insufficient documentation

## 2014-07-26 DIAGNOSIS — K219 Gastro-esophageal reflux disease without esophagitis: Secondary | ICD-10-CM | POA: Insufficient documentation

## 2014-07-26 DIAGNOSIS — Z87891 Personal history of nicotine dependence: Secondary | ICD-10-CM | POA: Insufficient documentation

## 2014-07-26 DIAGNOSIS — I252 Old myocardial infarction: Secondary | ICD-10-CM | POA: Diagnosis not present

## 2014-07-26 DIAGNOSIS — L98499 Non-pressure chronic ulcer of skin of other sites with unspecified severity: Secondary | ICD-10-CM | POA: Diagnosis not present

## 2014-07-26 DIAGNOSIS — D649 Anemia, unspecified: Secondary | ICD-10-CM | POA: Diagnosis not present

## 2014-07-26 DIAGNOSIS — I2510 Atherosclerotic heart disease of native coronary artery without angina pectoris: Secondary | ICD-10-CM | POA: Insufficient documentation

## 2014-07-26 DIAGNOSIS — G629 Polyneuropathy, unspecified: Secondary | ICD-10-CM | POA: Diagnosis not present

## 2014-07-26 DIAGNOSIS — J449 Chronic obstructive pulmonary disease, unspecified: Secondary | ICD-10-CM | POA: Insufficient documentation

## 2014-07-26 MED ORDER — SULFAMETHOXAZOLE-TRIMETHOPRIM 800-160 MG PO TABS: 1.0000 | ORAL_TABLET | Freq: Two times a day (BID) | ORAL | Status: DC

## 2014-07-26 MED ORDER — SULFAMETHOXAZOLE-TRIMETHOPRIM 800-160 MG PO TABS
1.0000 | ORAL_TABLET | Freq: Once | ORAL | Status: AC
  Administered 2014-07-26: 1 via ORAL
  Filled 2014-07-26: qty 1

## 2014-07-26 NOTE — ED Notes (Signed)
Pt complaining of sores to her lower legs & abdomen. Was seen by her "sugar dr yesterday" & did not address that issue.

## 2014-07-26 NOTE — Discharge Instructions (Signed)
Use domeboro solution on the lesions to dry them up. Take the antibiotics until gone. Keep your appointment with the dermatologist to further evaluate what is causing your rash.

## 2014-07-26 NOTE — Telephone Encounter (Signed)
Close encounter 

## 2014-07-26 NOTE — ED Notes (Signed)
Patient c/o several wounds on her legs, back and abdomen.  Patient c/o pain.

## 2014-07-26 NOTE — ED Notes (Signed)
Pt alert & oriented x4. Patient given discharge instructions, paperwork & prescription(s). Patient verbalized understanding. Pt left department w/ no further questions.  

## 2014-07-26 NOTE — ED Provider Notes (Signed)
CSN: [id number]     Arrival date & time 07/26/14  [id number] History   First MD Initiated Contact with Patient 07/26/14 0431     Chief Complaint  Patient presents with  . Wound Check     (Consider location/radiation/quality/duration/timing/severity/associated sxs/prior Treatment) HPI  Patient reports about a month ago she started getting some lesions that she describes as being pruritic. She states however she does not scratch at them. She states they do drink clear fluids sometimes. She states currently however they are all dried up and not draining. She denies any fever. She denies any lesions in her mouth. She states she saw her endocrinologist on February 22 who stated she did not want to start any treatment for them. She does have an appointment with a dermatologist in March. She states she's never had these before.  PCP Dr Orland Mustard Endocrinology Dr Cruzita Lederer  Past Medical History  Diagnosis Date  . Diabetes mellitus without complication   . Bipolar 1 disorder   . Fibromyalgia   . COPD (chronic obstructive pulmonary disease)   . CHF (congestive heart failure)   . Asthma   . Atrial fibrillation   . Neuropathy     Disc Back   . Fibromyalgia   . CHF (congestive heart failure)   . Coronary artery disease   . Anemia   . History of hiatal hernia   . GERD (gastroesophageal reflux disease)   . Myocardial infarction 1990  . Shortness of breath dyspnea    Past Surgical History  Procedure Laterality Date  . Heel spur surgery    . Appendectomy    . Hernia repair    . Bladder suspension      2003, 2006 and 2010  . Back surgery    . Neck surgery N/A 2009    4, 6, and 7 cervical disc replaced   Family History  Problem Relation Age of Onset  . Heart disease Mother   . COPD Mother   . Cancer Mother     Breast cancer  . Diabetes Mother   . Heart disease Father   . COPD Sister   . Heart disease Sister   . Diabetes Sister   . Heart disease Maternal Grandmother    History    Substance Use Topics  . Smoking status: Former Smoker -- 2.00 packs/day for 14 years    Types: Cigarettes    Quit date: 04/12/1986  . Smokeless tobacco: Not on file  . Alcohol Use: No   Lives at home Lives with spouse Oxygen 3 lpm [place]  OB History    No data available     Review of Systems  All other systems reviewed and are negative.     Allergies  Ancef; Augmentin; Ciprofloxacin; Haldol; Levaquin; Nsaids; Tamiflu; and Zoloft  Home Medications   Prior to Admission medications   Medication Sig Start Date End Date Taking? Authorizing Provider  atorvastatin (LIPITOR) 20 MG tablet Take 1 tablet (20 mg total) by mouth daily. 06/02/14  Yes Thurnell Lose, MD  cetirizine (ZYRTEC) 10 MG tablet Take 10 mg by mouth daily.   Yes Historical Provider, MD  diltiazem (CARDIZEM) 30 MG tablet Take 1 tablet (30 mg total) by mouth 2 (two) times daily. 04/12/14  Yes Pixie Casino, MD  docusate sodium (COLACE) 100 MG capsule Take 100 mg by mouth daily.   Yes Historical Provider, MD  escitalopram (LEXAPRO) 10 MG tablet Take 10 mg by mouth at bedtime.    Yes Historical Provider,  MD  famotidine (PEPCID) 40 MG tablet Take 40 mg by mouth 2 (two) times daily.   Yes Historical Provider, MD  fenofibrate 54 MG tablet Take 54 mg by mouth daily.   Yes Historical Provider, MD  ferrous sulfate 325 (65 FE) MG tablet Take 325 mg by mouth 2 (two) times daily with a meal.   Yes Historical Provider, MD  fluticasone (FLONASE) 50 MCG/ACT nasal spray Place 1 spray into both nostrils 2 (two) times daily.   Yes Historical Provider, MD  furosemide (LASIX) 40 MG tablet Take 1 tablet (40 mg total) by mouth 3 (three) times daily. 07/18/14  Yes Pixie Casino, MD  gabapentin (NEURONTIN) 300 MG capsule Take 900 mg by mouth 3 (three) times daily.    Yes Historical Provider, MD  Insulin Glargine (TOUJEO SOLOSTAR) 300 UNIT/ML SOPN Inject 60 Units into the skin at bedtime. 05/13/14  Yes Philemon Kingdom, MD  insulin lispro  (HUMALOG KWIKPEN) 100 UNIT/ML KiwkPen Inject 0.18-0.3 mLs (18-30 Units total) into the skin 3 (three) times daily. 06/13/14  Yes Philemon Kingdom, MD  Insulin Pen Needle (CAREFINE PEN NEEDLES) 32G X 4 MM MISC Use 4x a day 07/25/14  Yes Philemon Kingdom, MD  ipratropium (ATROVENT) 0.02 % nebulizer solution Take 0.5 mg by nebulization 2 (two) times daily.   Yes Historical Provider, MD  iron polysaccharides (NIFEREX) 150 MG capsule Take 1 capsule (150 mg total) by mouth 2 (two) times daily. 06/02/14  Yes Thurnell Lose, MD  metolazone (ZAROXOLYN) 2.5 MG tablet Take 2.5 mg by mouth 3 (three) times a week. Takes on Monday, Wed, and Fri   Yes Historical Provider, MD  mirtazapine (REMERON) 15 MG tablet Take 15 mg by mouth at bedtime.   Yes Historical Provider, MD  nystatin (MYCOSTATIN/NYSTOP) 100000 UNIT/GM POWD Apply topically 2 (two) times daily.   Yes Historical Provider, MD  ondansetron (ZOFRAN-ODT) 4 MG disintegrating tablet Take 4 mg by mouth every 8 (eight) hours as needed for nausea or vomiting.   Yes Historical Provider, MD  oxyCODONE (OXY IR/ROXICODONE) 5 MG immediate release tablet Take 5 mg by mouth 2 (two) times daily.   Yes Historical Provider, MD  OXYGEN Inhale into the lungs continuous. 3 liters 24/7   Yes Historical Provider, MD  pantoprazole (PROTONIX) 40 MG tablet Take 1 tablet (40 mg total) by mouth daily. 06/02/14  Yes Thurnell Lose, MD  potassium chloride SA (K-DUR,KLOR-CON) 20 MEQ tablet Take 40 mEq by mouth daily.    Yes Historical Provider, MD  pregabalin (LYRICA) 50 MG capsule Take 50 mg by mouth 3 (three) times daily.   Yes Historical Provider, MD  rivaroxaban (XARELTO) 20 MG TABS tablet Take 1 tablet (20 mg total) by mouth daily with supper. 07/18/14  Yes Pixie Casino, MD   BP 127/76 mmHg  Pulse 130  Temp(Src) 98.5 F (36.9 C) (Oral)  Resp 22  Ht 4\' 11"  (1.499 m)  Wt 218 lb (98.884 kg)  BMI 44.01 kg/m2  SpO2 93%  Vital signs normal except for  tachycardia  Physical Exam  Constitutional: She is oriented to person, place, and time. She appears well-developed and well-nourished.  Non-toxic appearance. She does not appear ill. No distress.  HENT:  Head: Normocephalic and atraumatic.  Right Ear: External ear normal.  Left Ear: External ear normal.  Nose: Nose normal. No mucosal edema or rhinorrhea.  Mouth/Throat: Oropharynx is clear and moist and mucous membranes are normal. No dental abscesses or uvula swelling.  [name] speech is  hard to understand because she is edentulous  Eyes: Conjunctivae and EOM are normal. Pupils are equal, round, and reactive to light.  Neck: Normal range of motion and full passive range of motion without pain. Neck supple.  Pulmonary/Chest: Effort normal. No respiratory distress. She has no wheezes. She has no rhonchi. She exhibits no crepitus.  Abdominal: Normal appearance.  Musculoskeletal: Normal range of motion. She exhibits no edema or tenderness.  Moves all extremities well.   Neurological: She is alert and oriented to person, place, and time. She has normal strength. No cranial nerve deficit.  Skin: Skin is warm, dry and intact. Rash noted. No erythema. No pallor.  Patient is noted to have some scattered superficial ulcerated areas on her right lower leg just above the ankle, one on her back, one on her left upper arm, one on her right posterior shoulder, a couple in her groin. Several are scabbed. There is no active drainage seen.  Psychiatric: She has a normal mood and affect. Her speech is normal and behavior is normal. Her mood appears not anxious.  Nursing note and vitals reviewed.   These are the deepest wounds that she has.      ED Course  Procedures (including critical care time)  Medications  sulfamethoxazole-trimethoprim (BACTRIM DS,SEPTRA DS) 800-160 MG per tablet 1 tablet (not administered)    Labs Review Labs Reviewed - No data to display  Imaging Review No results  found.   EKG Interpretation None      MDM   patient has a few superficial ulcers of lesions on her skin. Differential would include phemphigus or pemphigoid disease. This however can be determined by dermatology when they see her and they will probably do is punch biopsy of her lesions. She was placed on Septra to prevent MRSA infection.    Final diagnoses:  Skin rash   . New Prescriptions   SULFAMETHOXAZOLE-TRIMETHOPRIM (BACTRIM DS,SEPTRA DS) 800-160 MG PER TABLET    Take 1 tablet by mouth 2 (two) times daily.    Plan discharge  Rolland Porter, MD, Alanson Aly, MD 07/26/14 [phone]

## 2014-07-27 ENCOUNTER — Encounter: Payer: Medicare Other | Admitting: Obstetrics & Gynecology

## 2014-08-02 ENCOUNTER — Ambulatory Visit
Admission: RE | Admit: 2014-08-02 | Discharge: 2014-08-02 | Disposition: A | Discharge status: Home / Self Care | Payer: Medicare Other | Source: Ambulatory Visit

## 2014-08-02 DIAGNOSIS — Z1231 Encounter for screening mammogram for malignant neoplasm of breast: Secondary | ICD-10-CM | POA: Diagnosis not present

## 2014-08-04 DIAGNOSIS — G8929 Other chronic pain: Secondary | ICD-10-CM | POA: Diagnosis not present

## 2014-08-04 DIAGNOSIS — M545 Low back pain: Secondary | ICD-10-CM | POA: Diagnosis not present

## 2014-08-06 ENCOUNTER — Other Ambulatory Visit: Payer: Self-pay | Admitting: Internal Medicine

## 2014-08-09 ENCOUNTER — Encounter (HOSPITAL_COMMUNITY): Payer: Self-pay

## 2014-08-09 ENCOUNTER — Emergency Department (HOSPITAL_COMMUNITY)
Admission: EM | Admit: 2014-08-09 | Discharge: 2014-08-09 | Disposition: A | Discharge status: Home / Self Care | Payer: Medicare Other | Attending: Emergency Medicine | Admitting: Emergency Medicine

## 2014-08-09 ENCOUNTER — Emergency Department (HOSPITAL_COMMUNITY): Payer: Medicare Other

## 2014-08-09 DIAGNOSIS — R0600 Dyspnea, unspecified: Secondary | ICD-10-CM

## 2014-08-09 DIAGNOSIS — Z792 Long term (current) use of antibiotics: Secondary | ICD-10-CM | POA: Insufficient documentation

## 2014-08-09 DIAGNOSIS — I5021 Acute systolic (congestive) heart failure: Secondary | ICD-10-CM | POA: Diagnosis not present

## 2014-08-09 DIAGNOSIS — G629 Polyneuropathy, unspecified: Secondary | ICD-10-CM | POA: Insufficient documentation

## 2014-08-09 DIAGNOSIS — M797 Fibromyalgia: Secondary | ICD-10-CM | POA: Diagnosis not present

## 2014-08-09 DIAGNOSIS — Z79899 Other long term (current) drug therapy: Secondary | ICD-10-CM | POA: Diagnosis not present

## 2014-08-09 DIAGNOSIS — D649 Anemia, unspecified: Secondary | ICD-10-CM | POA: Diagnosis not present

## 2014-08-09 DIAGNOSIS — F419 Anxiety disorder, unspecified: Secondary | ICD-10-CM

## 2014-08-09 DIAGNOSIS — Z794 Long term (current) use of insulin: Secondary | ICD-10-CM | POA: Insufficient documentation

## 2014-08-09 DIAGNOSIS — J449 Chronic obstructive pulmonary disease, unspecified: Secondary | ICD-10-CM

## 2014-08-09 DIAGNOSIS — Z87891 Personal history of nicotine dependence: Secondary | ICD-10-CM | POA: Insufficient documentation

## 2014-08-09 DIAGNOSIS — I252 Old myocardial infarction: Secondary | ICD-10-CM | POA: Diagnosis not present

## 2014-08-09 DIAGNOSIS — I2510 Atherosclerotic heart disease of native coronary artery without angina pectoris: Secondary | ICD-10-CM | POA: Diagnosis not present

## 2014-08-09 DIAGNOSIS — Z7951 Long term (current) use of inhaled steroids: Secondary | ICD-10-CM | POA: Diagnosis not present

## 2014-08-09 DIAGNOSIS — K219 Gastro-esophageal reflux disease without esophagitis: Secondary | ICD-10-CM | POA: Insufficient documentation

## 2014-08-09 DIAGNOSIS — Z7902 Long term (current) use of antithrombotics/antiplatelets: Secondary | ICD-10-CM | POA: Insufficient documentation

## 2014-08-09 DIAGNOSIS — J441 Chronic obstructive pulmonary disease with (acute) exacerbation: Secondary | ICD-10-CM | POA: Diagnosis not present

## 2014-08-09 DIAGNOSIS — R0602 Shortness of breath: Secondary | ICD-10-CM | POA: Diagnosis not present

## 2014-08-09 DIAGNOSIS — I4891 Unspecified atrial fibrillation: Secondary | ICD-10-CM | POA: Insufficient documentation

## 2014-08-09 DIAGNOSIS — E119 Type 2 diabetes mellitus without complications: Secondary | ICD-10-CM | POA: Diagnosis not present

## 2014-08-09 DIAGNOSIS — F319 Bipolar disorder, unspecified: Secondary | ICD-10-CM | POA: Insufficient documentation

## 2014-08-09 MED ORDER — IPRATROPIUM-ALBUTEROL 0.5-2.5 (3) MG/3ML IN SOLN
3.0000 mL | Freq: Once | RESPIRATORY_TRACT | Status: AC
  Administered 2014-08-09: 3 mL via RESPIRATORY_TRACT
  Filled 2014-08-09: qty 3

## 2014-08-09 MED ORDER — FUROSEMIDE 10 MG/ML IJ SOLN
80.0000 mg | Freq: Once | INTRAMUSCULAR | Status: AC
  Administered 2014-08-09: 80 mg via INTRAMUSCULAR
  Filled 2014-08-09: qty 8

## 2014-08-09 NOTE — ED Notes (Signed)
Per Patient, Patient started to become short of breath often after she was discharged from hospital about two months ago. Patient is on chronic oxygen, 3L continuous. Patient reports tank ran out during MD appointment today when she was unaware and she started to become SOB. [name] oxygen saturation was 85% on RA at traige. Patient alert and oriented x4 at this time. Patient is speaking in broken sentences. Patient placed on cardiac monitor and oxygen at this time. 97% on 3L.

## 2014-08-09 NOTE — ED Provider Notes (Signed)
CSN: [id number]     Arrival date & time 08/09/14  1450 History   First MD Initiated Contact with Patient 08/09/14 1456     Chief Complaint  Patient presents with  . Shortness of Breath     (Consider location/radiation/quality/duration/timing/severity/associated sxs/prior Treatment) HPI Comments: 64 y.o. F presents to ED with cc of SOB. Pt has a hx of fibromyalgia, chronic COPD, CHF, CKD, anxiety and Bipolar Disorder. Pt states that she was our running errands today with her husband when they discovered that her oxygen regulator was not working properly. Pt reports getting anxious and having dib, light-headedness, and fatigue. Pt denies cough, recent infection, fever, and N/V. At arrival, pt was hypoxic - however, now she is on home O2 and feeling a lot better, and no longer feeling short of breath. Pt has hx of CHF, no new leg swelling and no new orthopnea or PND. Pt is denying chest pain.   ROS 10 Systems reviewed and are negative for acute change except as noted in the HPI.     Patient is a 64 y.o. female presenting with shortness of breath. The history is provided by the patient.  Shortness of Breath Associated symptoms: no abdominal pain, no chest pain, no headaches, no neck pain and no vomiting     Past Medical History  Diagnosis Date  . Diabetes mellitus without complication   . Bipolar 1 disorder   . Fibromyalgia   . COPD (chronic obstructive pulmonary disease)   . CHF (congestive heart failure)   . Asthma   . Atrial fibrillation   . Neuropathy     Disc Back   . Fibromyalgia   . CHF (congestive heart failure)   . Coronary artery disease   . Anemia   . History of hiatal hernia   . GERD (gastroesophageal reflux disease)   . Myocardial infarction 1990  . Shortness of breath dyspnea    Past Surgical History  Procedure Laterality Date  . Heel spur surgery    . Appendectomy    . Hernia repair    . Bladder suspension      2003, 2006 and 2010  . Back surgery    .  Neck surgery N/A 2009    4, 6, and 7 cervical disc replaced   Family History  Problem Relation Age of Onset  . Heart disease Mother   . COPD Mother   . Cancer Mother     Breast cancer  . Diabetes Mother   . Heart disease Father   . COPD Sister   . Heart disease Sister   . Diabetes Sister   . Heart disease Maternal Grandmother    History  Substance Use Topics  . Smoking status: Former Smoker -- 2.00 packs/day for 14 years    Types: Cigarettes    Quit date: 04/12/1986  . Smokeless tobacco: Not on file  . Alcohol Use: No   OB History    No data available     Review of Systems  Constitutional: Positive for activity change.  Respiratory: Positive for shortness of breath.   Cardiovascular: Negative for chest pain.  Gastrointestinal: Negative for nausea, vomiting and abdominal pain.  Genitourinary: Negative for dysuria.  Musculoskeletal: Negative for neck pain.  Neurological: Negative for headaches.  All other systems reviewed and are negative.     Allergies  Ancef; Augmentin; Ciprofloxacin; Haldol; Levaquin; Nsaids; Tamiflu; and Zoloft  Home Medications   Prior to Admission medications   Medication Sig Start Date End Date Taking?  Authorizing Provider  atorvastatin (LIPITOR) 20 MG tablet Take 1 tablet (20 mg total) by mouth daily. 06/02/14   Thurnell Lose, MD  cetirizine (ZYRTEC) 10 MG tablet Take 10 mg by mouth daily.    Historical Provider, MD  diltiazem (CARDIZEM) 30 MG tablet Take 1 tablet (30 mg total) by mouth 2 (two) times daily. 04/12/14   Pixie Casino, MD  docusate sodium (COLACE) 100 MG capsule Take 100 mg by mouth daily.    Historical Provider, MD  escitalopram (LEXAPRO) 10 MG tablet Take 10 mg by mouth at bedtime.     Historical Provider, MD  famotidine (PEPCID) 40 MG tablet Take 40 mg by mouth 2 (two) times daily.    Historical Provider, MD  fenofibrate 54 MG tablet Take 54 mg by mouth daily.    Historical Provider, MD  fluticasone (FLONASE) 50  MCG/ACT nasal spray Place 1 spray into both nostrils 2 (two) times daily.    Historical Provider, MD  furosemide (LASIX) 40 MG tablet Take 1 tablet (40 mg total) by mouth 3 (three) times daily. 07/18/14   Pixie Casino, MD  gabapentin (NEURONTIN) 300 MG capsule Take 900 mg by mouth 3 (three) times daily.     Historical Provider, MD  Insulin Glargine (TOUJEO SOLOSTAR) 300 UNIT/ML SOPN Inject 60 Units into the skin at bedtime. 05/13/14   Philemon Kingdom, MD  insulin lispro (HUMALOG KWIKPEN) 100 UNIT/ML KiwkPen Inject 0.18-0.3 mLs (18-30 Units total) into the skin 3 (three) times daily. 06/13/14   Philemon Kingdom, MD  ipratropium (ATROVENT) 0.02 % nebulizer solution Take 0.5 mg by nebulization 2 (two) times daily.    Historical Provider, MD  iron polysaccharides (NIFEREX) 150 MG capsule Take 1 capsule (150 mg total) by mouth 2 (two) times daily. 06/02/14   Thurnell Lose, MD  metolazone (ZAROXOLYN) 2.5 MG tablet Take 2.5 mg by mouth 3 (three) times a week. Takes on Monday, Wed, and Fri    Historical Provider, MD  mirtazapine (REMERON) 15 MG tablet Take 15 mg by mouth at bedtime.    Historical Provider, MD  nystatin (MYCOSTATIN/NYSTOP) 100000 UNIT/GM POWD Apply topically 2 (two) times daily.    Historical Provider, MD  ondansetron (ZOFRAN-ODT) 4 MG disintegrating tablet Take 4 mg by mouth every 8 (eight) hours as needed for nausea or vomiting.    Historical Provider, MD  oxyCODONE (OXY IR/ROXICODONE) 5 MG immediate release tablet Take 5 mg by mouth 2 (two) times daily.    Historical Provider, MD  OXYGEN Inhale into the lungs continuous. 3 liters 24/7    Historical Provider, MD  pantoprazole (PROTONIX) 40 MG tablet Take 1 tablet (40 mg total) by mouth daily. 06/02/14   Thurnell Lose, MD  potassium chloride SA (K-DUR,KLOR-CON) 20 MEQ tablet Take 40 mEq by mouth daily.     Historical Provider, MD  pregabalin (LYRICA) 50 MG capsule Take 50 mg by mouth 3 (three) times daily.    Historical Provider, MD   rivaroxaban (XARELTO) 20 MG TABS tablet Take 1 tablet (20 mg total) by mouth daily with supper. 07/18/14   Pixie Casino, MD  sulfamethoxazole-trimethoprim (BACTRIM DS,SEPTRA DS) 800-160 MG per tablet Take 1 tablet by mouth 2 (two) times daily. 07/26/14   Rolland Porter, MD   BP 115/63 mmHg  Pulse 89  Temp(Src) 98.7 F (37.1 C) (Oral)  Resp 23  Ht 4\' 11"  (1.499 m)  Wt 219 lb 9.6 oz (99.61 kg)  BMI 44.33 kg/m2  SpO2 96% Physical Exam  Constitutional: She is oriented to person, place, and time. She appears well-developed and well-nourished.  HENT:  Head: Normocephalic and atraumatic.  Eyes: EOM are normal. Pupils are equal, round, and reactive to light.  Neck: Neck supple.  Cardiovascular: Normal rate, regular rhythm and normal heart sounds.   Pulmonary/Chest: Effort normal. No respiratory distress. She has wheezes. She has no rales.  Mild wheezing, anterior only  Abdominal: Soft. She exhibits no distension. There is no tenderness. There is no rebound and no guarding.  Musculoskeletal: She exhibits edema.  Neurological: She is alert and oriented to person, place, and time.  Skin: Skin is warm and dry.  Nursing note and vitals reviewed.   ED Course  Procedures (including critical care time) Labs Review Labs Reviewed - No data to display  Imaging Review Dg Chest 2 View  08/09/2014   CLINICAL DATA:  64 year old female with 2 month history of shortness of breath. Past medical history includes COPD, CHF and atrial fibrillation.  EXAM: CHEST  2 VIEW  COMPARISON:  Prior chest x-ray 05/30/2014  FINDINGS: Stable cardiomegaly. Similar appearance of pulmonary vascular congestion bordering on interstitial edema. Interval development of blunting of the right costophrenic angle consistent with a moderate layering pleural effusion. Incompletely imaged anterior cervical fusion hardware. No acute osseous abnormality.  IMPRESSION: 1. Cardiomegaly and pulmonary vascular congestion with mild edema  consistent with mild CHF. 2. Interval development of a small to moderate layering right pleural effusion.   Electronically Signed   By: Jacqulynn Cadet M.D.   On: 08/09/2014 17:01     EKG Interpretation   Date/Time:  Tuesday August 09 2014 15:05:49 EST Ventricular Rate:  104 PR Interval:    QRS Duration: 97 QT Interval:  392 QTC Calculation: 516 R Axis:   -123 Text Interpretation:  Atrial fibrillation Abnormal lateral Q waves  Anterior infarct, old No significant change since last tracing Confirmed  by Kathrynn Humble, MD, Thelma Comp [phone]) on 08/09/2014 3:42:07 PM     @5 :00 Pt's CXR shows pulm congestion. Pt is still very comfortable. O2 sats 96% on 3 L. She has no dyspnea. Will give extra lasix im now. She understands the findings, and is happy to see her Cardiologist this week. We have asked her to take zaroxylin, which she takes m/w/f - on Thursday as well. PT and husband understand that pt needs to come back to the ER if the symptoms get worse.  MDM   Final diagnoses:  Dyspnea  Advanced COPD  Anxiety  Acute systolic congestive heart failure    PT comes in with cc of dyspnea. Pt saw her pcp, and was doing chores and feeling ok, however, started feeling short of breath soon after and noted that her machine was nor working. She has COPD on continuous O2 and also CHF. She has mild wheezing only./ She was hypoxic on arrival, but her O2 sats improved with oxygen here, and she feels a lot comfortable. Will get CXR. 1 breathing tx now. Anticipate d/c.  Varney Biles, MD 08/09/14 [phone]

## 2014-08-09 NOTE — ED Notes (Signed)
PA student at the bedside.  

## 2014-08-09 NOTE — Discharge Instructions (Signed)
Please take the [facility] everyday for rest of the weak. See your Cardiologist this week. There is some fluid around the lung - so if your breathing gets worse, come back to the ER immediately.  Chronic Obstructive Pulmonary Disease Chronic obstructive pulmonary disease (COPD) is a common lung condition in which airflow from the lungs is limited. COPD is a general term that can be used to describe many different lung problems that limit airflow, including both chronic bronchitis and emphysema. If you have COPD, your lung function will probably never return to normal, but there are measures you can take to improve lung function and make yourself feel better.  CAUSES   Smoking (common).   Exposure to secondhand smoke.   Genetic problems.  Chronic inflammatory lung diseases or recurrent infections. SYMPTOMS   Shortness of breath, especially with physical activity.   Deep, persistent (chronic) cough with a large amount of thick mucus.   Wheezing.   Rapid breaths (tachypnea).   Gray or bluish discoloration (cyanosis) of the skin, especially in fingers, toes, or lips.   Fatigue.   Weight loss.   Frequent infections or episodes when breathing symptoms become much worse (exacerbations).   Chest tightness. DIAGNOSIS  Your health care provider will take a medical history and perform a physical examination to make the initial diagnosis. Additional tests for COPD may include:   Lung (pulmonary) function tests.  Chest X-ray.  CT scan.  Blood tests. TREATMENT  Treatment available to help you feel better when you have COPD includes:   Inhaler and nebulizer medicines. These help manage the symptoms of COPD and make your breathing more comfortable.  Supplemental oxygen. Supplemental oxygen is only helpful if you have a low oxygen level in your blood.   Exercise and physical activity. These are beneficial for nearly all people with COPD. Some people may also benefit from  a pulmonary rehabilitation program. HOME CARE INSTRUCTIONS   Take all medicines (inhaled or pills) as directed by your health care provider.  Avoid over-the-counter medicines or cough syrups that dry up your airway (such as antihistamines) and slow down the elimination of secretions unless instructed otherwise by your health care provider.   If you are a smoker, the most important thing that you can do is stop smoking. Continuing to smoke will cause further lung damage and breathing trouble. Ask your health care provider for help with quitting smoking. He or she can direct you to community resources or hospitals that provide support.  Avoid exposure to irritants such as smoke, chemicals, and fumes that aggravate your breathing.  Use oxygen therapy and pulmonary rehabilitation if directed by your health care provider. If you require home oxygen therapy, ask your health care provider whether you should purchase a pulse oximeter to measure your oxygen level at home.   Avoid contact with individuals who have a contagious illness.  Avoid extreme temperature and humidity changes.  Eat healthy foods. Eating smaller, more frequent meals and resting before meals may help you maintain your strength.  Stay active, but balance activity with periods of rest. Exercise and physical activity will help you maintain your ability to do things you want to do.  Preventing infection and hospitalization is very important when you have COPD. Make sure to receive all the vaccines your health care provider recommends, especially the pneumococcal and influenza vaccines. Ask your health care provider whether you need a pneumonia vaccine.  Learn and use relaxation techniques to manage stress.  Learn and use controlled breathing  techniques as directed by your health care provider. Controlled breathing techniques include:   Pursed lip breathing. Start by breathing in (inhaling) through your nose for 1 second. Then,  purse your lips as if you were going to whistle and breathe out (exhale) through the pursed lips for 2 seconds.   Diaphragmatic breathing. Start by putting one hand on your abdomen just above your waist. Inhale slowly through your nose. The hand on your abdomen should move out. Then purse your lips and exhale slowly. You should be able to feel the hand on your abdomen moving in as you exhale.   Learn and use controlled coughing to clear mucus from your lungs. Controlled coughing is a series of short, progressive coughs. The steps of controlled coughing are:  1. Lean your head slightly forward.  2. Breathe in deeply using diaphragmatic breathing.  3. Try to hold your breath for 3 seconds.  4. Keep your mouth slightly open while coughing twice.  5. Spit any mucus out into a tissue.  6. Rest and repeat the steps once or twice as needed. SEEK MEDICAL CARE IF:   You are coughing up more mucus than usual.   There is a change in the color or thickness of your mucus.   Your breathing is more labored than usual.   Your breathing is faster than usual.  SEEK IMMEDIATE MEDICAL CARE IF:   You have shortness of breath while you are resting.   You have shortness of breath that prevents you from:  Being able to talk.   Performing your usual physical activities.   You have chest pain lasting longer than 5 minutes.   Your skin color is more cyanotic than usual.  You measure low oxygen saturations for longer than 5 minutes with a pulse oximeter. MAKE SURE YOU:   Understand these instructions.  Will watch your condition.  Will get help right away if you are not doing well or get worse. Document Released: 02/27/2005 Document Revised: 10/04/2013 Document Reviewed: 01/14/2013 [facility] Patient Information 2015 [place], [place]. This information is not intended to replace advice given to you by your health care provider. Make sure you discuss any questions you have with your health  care provider. Heart Failure Heart failure is a condition in which the heart has trouble pumping blood. This means your heart does not pump blood efficiently for your body to work well. In some cases of heart failure, fluid may back up into your lungs or you may have swelling (edema) in your lower legs. Heart failure is usually a long-term (chronic) condition. It is important for you to take good care of yourself and follow your health care provider's treatment plan. CAUSES  Some health conditions can cause heart failure. Those health conditions include:  High blood pressure (hypertension). Hypertension causes the heart muscle to work harder than normal. When pressure in the blood vessels is high, the heart needs to pump (contract) with more force in order to circulate blood throughout the body. High blood pressure eventually causes the heart to become stiff and weak.  Coronary artery disease (CAD). CAD is the buildup of cholesterol and fat (plaque) in the arteries of the heart. The blockage in the arteries deprives the heart muscle of oxygen and blood. This can cause chest pain and may lead to a heart attack. High blood pressure can also contribute to CAD.  Heart attack (myocardial infarction). A heart attack occurs when one or more arteries in the heart become blocked. The loss  of oxygen damages the muscle tissue of the heart. When this happens, part of the heart muscle dies. The injured tissue does not contract as well and weakens the heart's ability to pump blood.  Abnormal heart valves. When the heart valves do not open and close properly, it can cause heart failure. This makes the heart muscle pump harder to keep the blood flowing.  Heart muscle disease (cardiomyopathy or myocarditis). Heart muscle disease is damage to the heart muscle from a variety of causes. These can include drug or alcohol abuse, infections, or unknown reasons. These can increase the risk of heart failure.  Lung disease.  Lung disease makes the heart work harder because the lungs do not work properly. This can cause a strain on the heart, leading it to fail.  Diabetes. Diabetes increases the risk of heart failure. High blood sugar contributes to high fat (lipid) levels in the blood. Diabetes can also cause slow damage to tiny blood vessels that carry important nutrients to the heart muscle. When the heart does not get enough oxygen and food, it can cause the heart to become weak and stiff. This leads to a heart that does not contract efficiently.  Other conditions can contribute to heart failure. These include abnormal heart rhythms, thyroid problems, and low blood counts (anemia). Certain unhealthy behaviors can increase the risk of heart failure, including:  Being overweight.  Smoking or chewing tobacco.  Eating foods high in fat and cholesterol.  Abusing illicit drugs or alcohol.  Lacking physical activity. SYMPTOMS  Heart failure symptoms may vary and can be hard to detect. Symptoms may include:  Shortness of breath with activity, such as climbing stairs.  Persistent cough.  Swelling of the feet, ankles, legs, or abdomen.  Unexplained weight gain.  Difficulty breathing when lying flat (orthopnea).  Waking from sleep because of the need to sit up and get more air.  Rapid heartbeat.  Fatigue and loss of energy.  Feeling light-headed, dizzy, or close to fainting.  Loss of appetite.  Nausea.  Increased urination during the night (nocturia). DIAGNOSIS  A diagnosis of heart failure is based on your history, symptoms, physical examination, and diagnostic tests. Diagnostic tests for heart failure may include:  Echocardiography.  Electrocardiography.  Chest X-ray.  Blood tests.  Exercise stress test.  Cardiac angiography.  Radionuclide scans. TREATMENT  Treatment is aimed at managing the symptoms of heart failure. Medicines, behavioral changes, or surgical intervention may be  necessary to treat heart failure.  Medicines to help treat heart failure may include:  Angiotensin-converting enzyme (ACE) inhibitors. This type of medicine blocks the effects of a blood protein called angiotensin-converting enzyme. ACE inhibitors relax (dilate) the blood vessels and help lower blood pressure.  Angiotensin receptor blockers (ARBs). This type of medicine blocks the actions of a blood protein called angiotensin. Angiotensin receptor blockers dilate the blood vessels and help lower blood pressure.  Water pills (diuretics). Diuretics cause the kidneys to remove salt and water from the blood. The extra fluid is removed through urination. This loss of extra fluid lowers the volume of blood the heart pumps.  Beta blockers. These prevent the heart from beating too fast and improve heart muscle strength.  Digitalis. This increases the force of the heartbeat.  Healthy behavior changes include:  Obtaining and maintaining a healthy weight.  Stopping smoking or chewing tobacco.  Eating heart-healthy foods.  Limiting or avoiding alcohol.  Stopping illicit drug use.  Physical activity as directed by your health care provider.  Surgical treatment for heart failure may include:  A procedure to open blocked arteries, repair damaged heart valves, or remove damaged heart muscle tissue.  A pacemaker to improve heart muscle function and control certain abnormal heart rhythms.  An internal cardioverter defibrillator to treat certain serious abnormal heart rhythms.  A left ventricular assist device (LVAD) to assist the pumping ability of the heart. HOME CARE INSTRUCTIONS  7. Take medicines only as directed by your health care provider. Medicines are important in reducing the workload of your heart, slowing the progression of heart failure, and improving your symptoms. 1. Do not stop taking your medicine unless directed by your health care provider. 2. Do not skip any dose of  medicine. 3. Refill your prescriptions before you run out of medicine. Your medicines are needed every day. 8. Engage in moderate physical activity if directed by your health care provider. Moderate physical activity can benefit some people. The elderly and people with severe heart failure should consult with a health care provider for physical activity recommendations. 9. Eat heart-healthy foods. Food choices should be free of trans fat and low in saturated fat, cholesterol, and salt (sodium). Healthy choices include fresh or frozen fruits and vegetables, fish, lean meats, legumes, fat-free or low-fat dairy products, and whole grain or high fiber foods. Talk to a dietitian to learn more about heart-healthy foods. 10. Limit sodium if directed by your health care provider. Sodium restriction may reduce symptoms of heart failure in some people. Talk to a dietitian to learn more about heart-healthy seasonings. 11. Use healthy cooking methods. Healthy cooking methods include roasting, grilling, broiling, baking, poaching, steaming, or stir-frying. Talk to a dietitian to learn more about healthy cooking methods. 12. Limit fluids if directed by your health care provider. Fluid restriction may reduce symptoms of heart failure in some people. 13. Weigh yourself every day. Daily weights are important in the early recognition of excess fluid. You should weigh yourself every morning after you urinate and before you eat breakfast. Wear the same amount of clothing each time you weigh yourself. Record your daily weight. Provide your health care provider with your weight record. 14. Monitor and record your blood pressure if directed by your health care provider. 15. Check your pulse if directed by your health care provider. 68. Lose weight if directed by your health care provider. Weight loss may reduce symptoms of heart failure in some people. 17. Stop smoking or chewing tobacco. Nicotine makes your heart work harder  by causing your blood vessels to constrict. Do not use nicotine gum or patches before talking to your health care provider. 31. Keep all follow-up visits as directed by your health care provider. This is important. 19. Limit alcohol intake to no more than 1 drink per day for nonpregnant women and 2 drinks per day for men. One drink equals 12 ounces of beer, 5 ounces of wine, or 1 ounces of hard liquor. Drinking more than that is harmful to your heart. Tell your health care provider if you drink alcohol several times a week. Talk with your health care provider about whether alcohol is safe for you. If your heart has already been damaged by alcohol or you have severe heart failure, drinking alcohol should be stopped completely. 20. Stop illicit drug use. 21. Stay up-to-date with immunizations. It is especially important to prevent respiratory infections through current pneumococcal and influenza immunizations. 22. Manage other health conditions such as hypertension, diabetes, thyroid disease, or abnormal heart rhythms as directed by  your health care provider. 23. Learn to manage stress. 24. Plan rest periods when fatigued. 25. Learn strategies to manage high temperatures. If the weather is extremely hot: 1. Avoid vigorous physical activity. 2. Use air conditioning or fans or seek a cooler location. 3. Avoid caffeine and alcohol. 4. Wear loose-fitting, lightweight, and light-colored clothing. 26. Learn strategies to manage cold temperatures. If the weather is extremely cold: 1. Avoid vigorous physical activity. 2. Layer clothes. 3. Wear mittens or gloves, a hat, and a scarf when going outside. 4. Avoid alcohol. 74. Obtain ongoing education and support as needed. 62. Participate in or seek rehabilitation as needed to maintain or improve independence and quality of life. SEEK MEDICAL CARE IF:   Your weight increases by 03 lb/1.4 kg in 1 day or 05 lb/2.3 kg in a week.  You have increasing  shortness of breath that is unusual for you.  You are unable to participate in your usual physical activities.  You tire easily.  You cough more than normal, especially with physical activity.  You have any or more swelling in areas such as your hands, feet, ankles, or abdomen.  You are unable to sleep because it is hard to breathe.  You feel like your heart is beating fast (palpitations).  You become dizzy or light-headed upon standing up. SEEK IMMEDIATE MEDICAL CARE IF:   You have difficulty breathing.  There is a change in mental status such as decreased alertness or difficulty with concentration.  You have a pain or discomfort in your chest.  You have an episode of fainting (syncope). MAKE SURE YOU:   Understand these instructions.  Will watch your condition.  Will get help right away if you are not doing well or get worse. Document Released: 05/20/2005 Document Revised: 10/04/2013 Document Reviewed: 06/19/2012 [facility] Patient Information 2015 [place], [place]. This information is not intended to replace advice given to you by your health care provider. Make sure you discuss any questions you have with your health care provider.

## 2014-08-09 NOTE — ED Notes (Signed)
MD at the bedside  

## 2014-08-09 NOTE — ED Notes (Signed)
NAD at this time. Pt is stable and leaving with her husband. 

## 2014-08-11 ENCOUNTER — Ambulatory Visit: Payer: Medicare Other | Admitting: Internal Medicine

## 2014-08-12 DIAGNOSIS — L281 Prurigo nodularis: Secondary | ICD-10-CM | POA: Diagnosis not present

## 2014-08-12 DIAGNOSIS — L821 Other seborrheic keratosis: Secondary | ICD-10-CM | POA: Diagnosis not present

## 2014-08-15 DIAGNOSIS — J449 Chronic obstructive pulmonary disease, unspecified: Secondary | ICD-10-CM | POA: Diagnosis not present

## 2014-08-15 DIAGNOSIS — I10 Essential (primary) hypertension: Secondary | ICD-10-CM | POA: Diagnosis not present

## 2014-08-15 DIAGNOSIS — E119 Type 2 diabetes mellitus without complications: Secondary | ICD-10-CM | POA: Diagnosis not present

## 2014-08-15 DIAGNOSIS — I4891 Unspecified atrial fibrillation: Secondary | ICD-10-CM | POA: Diagnosis not present

## 2014-08-15 DIAGNOSIS — D649 Anemia, unspecified: Secondary | ICD-10-CM | POA: Diagnosis not present

## 2014-08-15 DIAGNOSIS — I509 Heart failure, unspecified: Secondary | ICD-10-CM | POA: Diagnosis not present

## 2014-08-16 ENCOUNTER — Institutional Professional Consult (permissible substitution): Payer: Medicare Other | Admitting: Pulmonary Disease

## 2014-08-22 ENCOUNTER — Encounter: Payer: Self-pay | Admitting: Pulmonary Disease

## 2014-08-22 ENCOUNTER — Ambulatory Visit (INDEPENDENT_AMBULATORY_CARE_PROVIDER_SITE_OTHER): Payer: Medicare Other | Admitting: Pulmonary Disease

## 2014-08-22 VITALS — BP 142/66 | HR 91 | Temp 98.0°F | Ht 59.0 in | Wt 225.2 lb

## 2014-08-22 DIAGNOSIS — G4733 Obstructive sleep apnea (adult) (pediatric): Secondary | ICD-10-CM | POA: Diagnosis not present

## 2014-08-22 NOTE — Patient Instructions (Signed)
Your download shows good control of your sleep apnea, but there is excessive mask leak that I suspect is coming from mouth opening. Will try to add a chin strap to see if this helps with mask leak Work on weight loss Will get you referred to a medical equipment company here in town for cpap supplies. followup with me again in [id number]

## 2014-08-22 NOTE — Assessment & Plan Note (Signed)
The patient has had long-standing obstructive sleep apnea since 1991, and has been on a bilevel device after failing C Pap. She tells me that her machine is not very old, but is having issues with nonrestorative sleep and also some increased daytime sleepiness. Her weight has increased 20-25 pounds over the last few years, and therefore her pressure setting may not be adequate. She also uses a nasal mask, but admits that she does have some mouth opening at times. At this point, we'll get a download off her device to see if we are adequately controlling her sleep apnea, and she will also need referral to a home care company here in [place]. I have also encouraged her to work aggressively on weight loss.

## 2014-08-22 NOTE — Progress Notes (Signed)
   Subjective:    Patient ID: Denise Freeman, female    DOB: 1950/09/09, 65 y.o.   MRN: [id number]  HPI The patient is a 64 year old female who comes in today as a self-referral for management of obstructive sleep apnea. They recently have moved tear from [place], and needs to establish with a physician and a home care company for her sleep apnea. She was initially diagnosed in 1991, but her studies are not available for my review.  She apparently failed C, but had to start on bilevel instead. She also uses 3 L of oxygen with her device. She tells me that her machine is no more than 64 years old, and currently is using a nasal mask. She admits to having some mouth opening during the night. She feels that she sleeps well with the device, but is not rested in the mornings upon arising. She also has issues with daytime sleepiness during the day at times. The patient states that her weight is up 20-25 pounds over the last 2 years, and her Epworth score today is 8   Review of Systems  Constitutional: Negative for fever, chills and unexpected weight change.  HENT: Positive for congestion. Negative for dental problem, ear pain, nosebleeds, postnasal drip, rhinorrhea, sinus pressure, sneezing, sore throat, trouble swallowing and voice change.   Eyes: Negative for redness, itching and visual disturbance.  Respiratory: Positive for shortness of breath. Negative for cough, choking, chest tightness and wheezing.   Cardiovascular: Positive for leg swelling. Negative for chest pain and palpitations.  Gastrointestinal: Negative for nausea, vomiting, abdominal pain and diarrhea.  Genitourinary: Negative for dysuria and difficulty urinating.  Musculoskeletal: Positive for arthralgias. Negative for joint swelling.  Skin: Negative for rash.  Neurological: Negative for tremors, syncope and headaches.  Hematological: Does not bruise/bleed easily.  Psychiatric/Behavioral: Positive for dysphoric mood. The patient is  nervous/anxious.        Objective:   Physical Exam Constitutional:  Obese female, no acute distress  HENT:  Nares patent without discharge  Oropharynx without exudate, palate and uvula are elongated  Eyes:  Perrla, eomi, no scleral icterus  Neck:  No JVD, no TMG  Cardiovascular:  Normal rate, regular rhythm, no rubs or gallops.  No murmurs        Intact distal pulses  Pulmonary :  Decreased depth of inspiration, no stridor or respiratory distress   No rales, rhonchi, or wheezing  Abdominal:  Soft, nondistended, bowel sounds present.  No tenderness noted.   Musculoskeletal:  2+ lower extremity edema noted.  Lymph Nodes:  No cervical lymphadenopathy noted  Skin:  No cyanosis noted  Neurologic:  Alert, appropriate, moves all 4 extremities without obvious deficit.         Assessment & Plan:

## 2014-08-29 ENCOUNTER — Institutional Professional Consult (permissible substitution): Payer: Medicare Other | Admitting: Pulmonary Disease

## 2014-09-02 DIAGNOSIS — G8929 Other chronic pain: Secondary | ICD-10-CM | POA: Diagnosis not present

## 2014-09-02 DIAGNOSIS — M545 Low back pain: Secondary | ICD-10-CM | POA: Diagnosis not present

## 2014-09-02 DIAGNOSIS — M25512 Pain in left shoulder: Secondary | ICD-10-CM | POA: Diagnosis not present

## 2014-09-06 DIAGNOSIS — F459 Somatoform disorder, unspecified: Secondary | ICD-10-CM | POA: Diagnosis not present

## 2014-09-13 ENCOUNTER — Ambulatory Visit (INDEPENDENT_AMBULATORY_CARE_PROVIDER_SITE_OTHER): Payer: Medicare Other | Admitting: Internal Medicine

## 2014-09-13 ENCOUNTER — Other Ambulatory Visit: Payer: Self-pay | Admitting: *Deleted

## 2014-09-13 ENCOUNTER — Encounter: Payer: Self-pay | Admitting: Internal Medicine

## 2014-09-13 VITALS — BP 118/68 | HR 93 | Temp 98.7°F | Resp 12

## 2014-09-13 DIAGNOSIS — E1365 Other specified diabetes mellitus with hyperglycemia: Principal | ICD-10-CM

## 2014-09-13 DIAGNOSIS — N183 Chronic kidney disease, stage 3 unspecified: Secondary | ICD-10-CM

## 2014-09-13 DIAGNOSIS — E1329 Other specified diabetes mellitus with other diabetic kidney complication: Secondary | ICD-10-CM

## 2014-09-13 DIAGNOSIS — H9203 Otalgia, bilateral: Secondary | ICD-10-CM | POA: Diagnosis not present

## 2014-09-13 DIAGNOSIS — E1322 Other specified diabetes mellitus with diabetic chronic kidney disease: Secondary | ICD-10-CM

## 2014-09-13 DIAGNOSIS — IMO0002 Reserved for concepts with insufficient information to code with codable children: Secondary | ICD-10-CM

## 2014-09-13 LAB — HEMOGLOBIN A1C: Hgb A1c MFr Bld: 8.3 % — ABNORMAL HIGH (ref 4.6–6.5)

## 2014-09-13 MED ORDER — FENOFIBRATE 54 MG PO TABS: 54.0000 mg | ORAL_TABLET | Freq: Every day | ORAL | Status: DC

## 2014-09-13 MED ORDER — INSULIN PEN NEEDLE 31G X 8 MM MISC: Status: DC

## 2014-09-13 MED ORDER — INSULIN LISPRO 100 UNIT/ML (KWIKPEN): 18.0000 [IU] | PEN_INJECTOR | Freq: Three times a day (TID) | SUBCUTANEOUS | Status: DC

## 2014-09-13 MED ORDER — INSULIN GLARGINE 300 UNIT/ML ~~LOC~~ SOPN: 60.0000 [IU] | PEN_INJECTOR | Freq: Every day | SUBCUTANEOUS | Status: DC

## 2014-09-13 NOTE — Progress Notes (Signed)
Patient ID: Denise Freeman, female   DOB: [birth year], 64 y.o.   MRN: [id number]  HPI: Denise Freeman is a 64 y.o.-year-old female, initially referred by her PCP, Dr. Loman Chroman, for management of DM2, dx in 2009, insulin-dependent in 2013, uncontrolled, with complications (CKD, DR). Last visit 1.5 mo ago.  Ulcers on her legs are better. She c/o B ear pain.  Last hemoglobin A1c was: Lab Results  Component Value Date   HGBA1C 7.6* 05/13/2014   12/30/2013: HbA1c 8.6% 12/28/2013: HbA1c 7.9% HbA1c 8.7% HbA1c 9.4% HbA1c 7.3%  Pt is on a regimen of: - Toujeo 65 units at bedtime. - Humalog to: 18 units before B 22 units before L  24 units before D - sliding scale of Humalog - 150-175: + 1 unit  - 176-200: + 2 units  - 201-225: + 3 units  - 226-250: + 4 units  - 251-275: + 5 units - >275: + 6 units We stopped Januvia.  Pt checks her sugars 4x a day and they are better (+ good log): - am: 149-170 >> 158-191 >> 160-175, 180 >> 173-189 - 2h after b'fast: n/c - before lunch: 158-199 >> 170-199 >> 130-150 >> 167-191 - 2h after lunch: n/c - before dinner: 169-205, 225 >> 168-200 >> 175-190  >> 179-200 - 2h after dinner: n/c - bedtime: 154-199 >> 162-200, last week 210-235 >> 165-190 >> 169-238 - nighttime:n/c No lows. Lowest sugar was 130 >> 167; she has hypoglycemia awareness at 70 at 120. Highest sugar was 240 >> 190.   Glucometer: FreeStyle  Pt's meals are: - Breakfast: toast with spread or oatmeal with splenda - Lunch: cottage cheese, or fruit - Dinner: chicken/beef/turkey with vegetables and a starch; macaroni and cheese - Snacks: 2 snacks   - + CKD stage 3, last BUN/creatinine:  Lab Results  Component Value Date   BUN 37* 06/01/2014   CREATININE 1.14* 06/01/2014  On Lisinopril, fenofibrate. - last set of lipids: 09/07/2013: 180/135/31/138 On Crestor. - last eye exam was in 2014. 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 She has another one scheduled in 10/2014. - no numbness and tingling in her  feet.  ROS: Constitutional: + weight loss, + fatigue, + poor sleep, + nocturia Eyes: + blurry vision, no xerophthalmia ENT: no sore throat, no nodules palpated in throat, no dysphagia/odynophagia, no hoarseness, + hypoacusis, + tinnitus Cardiovascular: + CP/+ SOB/+ palpitations/+ leg swelling Respiratory: no cough/+ SOB/+ wheezing Gastrointestinal: no N/no V/+ D/no C, + heartburn Musculoskeletal: + muscle aches/+ joint aches Skin: no rashes, + easy bruising Neurological: no tremors/numbness/tingling/dizziness, + HA  I reviewed pt's medications, allergies, PMH, social hx, family hx, and changes were documented in the history of present illness. Otherwise, unchanged from my initial visit note:  Past Medical History  Diagnosis Date  . Diabetes mellitus without complication   . Bipolar 1 disorder   . Fibromyalgia   . COPD (chronic obstructive pulmonary disease)   . CHF (congestive heart failure)   . Asthma   . Atrial fibrillation   . Neuropathy     Disc Back   . Fibromyalgia   . CHF (congestive heart failure)   . Coronary artery disease   . Anemia   . History of hiatal hernia   . GERD (gastroesophageal reflux disease)   . Myocardial infarction 1990  . Shortness of breath dyspnea    Past Surgical History  Procedure Laterality Date  . Heel spur surgery    . Appendectomy    . Hernia repair    .  Bladder suspension      2003, 2006 and 2010  . Back surgery    . Neck surgery N/A 2009    4, 6, and 7 cervical disc replaced   History   Social History  . Marital Status: Married    Spouse Name: N/A    Number of Children: 1   Occupational History  . disabled   Social History Main Topics  . Smoking status: Former Smoker -- 2.00 packs/day for 14 years    Types: Cigarettes    Quit date: 04/12/1986  . Smokeless tobacco: Not on file  . Alcohol Use: No  . Drug Use: No   Current Outpatient Prescriptions on File Prior to Visit  Medication Sig Dispense Refill  . atorvastatin  (LIPITOR) 20 MG tablet Take 1 tablet (20 mg total) by mouth daily. 30 tablet 1  . cetirizine (ZYRTEC) 10 MG tablet Take 10 mg by mouth daily.    Marland Kitchen diltiazem (CARDIZEM) 30 MG tablet Take 1 tablet (30 mg total) by mouth 2 (two) times daily. 180 tablet 1  . docusate sodium (COLACE) 100 MG capsule Take 100 mg by mouth daily.    Marland Kitchen escitalopram (LEXAPRO) 10 MG tablet Take 10 mg by mouth at bedtime.     . famotidine (PEPCID) 40 MG tablet Take 40 mg by mouth 2 (two) times daily.    . fenofibrate 54 MG tablet Take 54 mg by mouth daily.    . fluticasone (FLONASE) 50 MCG/ACT nasal spray Place 1 spray into both nostrils 2 (two) times daily.    . furosemide (LASIX) 40 MG tablet Take 1 tablet (40 mg total) by mouth 3 (three) times daily. 270 tablet 1  . gabapentin (NEURONTIN) 300 MG capsule Take 900 mg by mouth 3 (three) times daily.     . Insulin Glargine (TOUJEO SOLOSTAR) 300 UNIT/ML SOPN Inject 60 Units into the skin at bedtime. 9 pen 1  . insulin lispro (HUMALOG KWIKPEN) 100 UNIT/ML KiwkPen Inject 0.18-0.3 mLs (18-30 Units total) into the skin 3 (three) times daily. 45 mL 1  . ipratropium (ATROVENT) 0.02 % nebulizer solution Take 0.5 mg by nebulization 2 (two) times daily.    . iron polysaccharides (NIFEREX) 150 MG capsule Take 1 capsule (150 mg total) by mouth 2 (two) times daily. 60 capsule 0  . metolazone (ZAROXOLYN) 2.5 MG tablet Take 2.5 mg by mouth 3 (three) times a week. Takes on Monday, Wed, and Fri    . mirtazapine (REMERON) 15 MG tablet Take 15 mg by mouth at bedtime.    . modafinil (PROVIGIL) 200 MG tablet Take 200 mg by mouth daily.    Marland Kitchen nystatin (MYCOSTATIN/NYSTOP) 100000 UNIT/GM POWD Apply topically 2 (two) times daily.    . ondansetron (ZOFRAN-ODT) 4 MG disintegrating tablet Take 4 mg by mouth every 8 (eight) hours as needed for nausea or vomiting.    Marland Kitchen oxyCODONE (OXY IR/ROXICODONE) 5 MG immediate release tablet Take 5 mg by mouth 2 (two) times daily.    . OXYGEN Inhale into the lungs  continuous. 3 liters 24/7    . pantoprazole (PROTONIX) 40 MG tablet Take 1 tablet (40 mg total) by mouth daily. 30 tablet 0  . potassium chloride SA (K-DUR,KLOR-CON) 20 MEQ tablet Take 40 mEq by mouth daily.     . pregabalin (LYRICA) 50 MG capsule Take 50 mg by mouth 3 (three) times daily.    . rivaroxaban (XARELTO) 20 MG TABS tablet Take 1 tablet (20 mg total) by mouth daily with  supper. 90 tablet 1   No current facility-administered medications on file prior to visit.   Allergies  Allergen Reactions  . Ancef [Cefazolin] Nausea And Vomiting  . Augmentin [Amoxicillin-Pot Clavulanate] Nausea Only  . Ciprofloxacin Nausea And Vomiting  . Haldol [Haloperidol] Other (See Comments)    Restless leg  . Levaquin [Levofloxacin In D5w] Other (See Comments)    "afib"  . Nsaids Diarrhea  . Tamiflu [Oseltamivir Phosphate] Other (See Comments)    "water blisters"  . Zoloft [Sertraline Hcl] Other (See Comments)    Jaw problems   Family History  Problem Relation Age of Onset  . Heart disease Mother   . COPD Mother   . Cancer Mother     Breast cancer  . Diabetes Mother   . Heart disease Father   . COPD Sister   . Heart disease Sister   . Diabetes Sister   . Heart disease Maternal Grandmother    PE: BP 118/68 mmHg  Pulse 93  Temp(Src) 98.7 F (37.1 C) (Oral)  Resp 12  SpO2 94% There is no weight on file to calculate BMI. Wt Readings from Last 3 Encounters:  08/22/14 225 lb 3.2 oz (102.15 kg)  08/09/14 219 lb 9.6 oz (99.61 kg)  07/26/14 218 lb (98.884 kg)   Constitutional: obese, in wheelchair, in NAD, on oxygen Eyes: PERRLA, EOMI, no exophthalmos ENT: moist mucous membranes, no thyromegaly, no cervical lymphadenopathy, B ear wax Cardiovascular: irreg. Irreg. , No MRG Respiratory: CTA B Gastrointestinal: abdomen soft, NT, ND, BS+ Musculoskeletal: no deformities, strength intact in all 4 Skin: moist, warm, + rash/abrasion - on upper R temple Neurological: no tremor with  outstretched hands, DTR normal in all 4  ASSESSMENT: 1. DM2, insulin-dependent, uncontrolled, with complications - CKD stage 3 - DR  2. Ear pain  PLAN:  1. Patient with long-standing, uncontrolled diabetes (but last HbA1c not far from goal), on basal-bolus insulin regimen - with still high sugars throughout the day >> increase Toujeo I also congratulated the pt for her weight loss! - I advised her to:   Patient Instructions  - Please increase Toujeo to 75 units at bedtime. - Continue Humalog to: 18 units before B 22 units before L  24 units before D Please continue the following sliding scale of Humalog - 150-175: + 1 unit  - 176-200: + 2 units  - 201-225: + 3 units  - 226-250: + 4 units  - 251-275: + 5 units - >275: + 6 units  Please return in 1.5 month with your sugar log.  Please stop at the lab.  - Strongly advised her to continue checking sugars at different times of the day - check 4 times a day, rotating checks - check hba1c today - advised for yearly eye exams >> needs one! - Return to clinic in 1.5 mo with sugar log   2. Ear pain - l advised her to have the ears irrigated by PCP in 3 days >> we cannot do this here  Office Visit on 09/13/2014  Component Date Value Ref Range Status  . Hgb A1c MFr Bld 09/13/2014 8.3* 4.6 - 6.5 % Final   Glycemic Control Guidelines for People with Diabetes:Non Diabetic:  <6%Goal of Therapy: <7%Additional Action Suggested:  >8%    Hemoglobin A1c is higher. We'll see if increasing toujeo helps.

## 2014-09-13 NOTE — Patient Instructions (Addendum)
Patient Instructions  - Please increase Toujeo to 75 units at bedtime. - Continue Humalog to: 18 units before B 22 units before L  24 units before D Please continue the following sliding scale of Humalog - 150-175: + 1 unit  - 176-200: + 2 units  - 201-225: + 3 units  - 226-250: + 4 units  - 251-275: + 5 units - >275: + 6 units  Please return in 1.5 month with your sugar log.  Please stop at the lab.

## 2014-09-14 ENCOUNTER — Telehealth: Payer: Self-pay | Admitting: Internal Medicine

## 2014-09-14 ENCOUNTER — Ambulatory Visit: Payer: Medicare Other | Admitting: Internal Medicine

## 2014-09-15 ENCOUNTER — Ambulatory Visit: Payer: Medicare Other | Attending: Anesthesiology | Admitting: Physical Therapy

## 2014-09-15 DIAGNOSIS — M2560 Stiffness of unspecified joint, not elsewhere classified: Secondary | ICD-10-CM | POA: Insufficient documentation

## 2014-09-15 DIAGNOSIS — M545 Low back pain, unspecified: Secondary | ICD-10-CM

## 2014-09-15 DIAGNOSIS — R531 Weakness: Secondary | ICD-10-CM | POA: Diagnosis not present

## 2014-09-15 DIAGNOSIS — H6121 Impacted cerumen, right ear: Secondary | ICD-10-CM | POA: Diagnosis not present

## 2014-09-15 DIAGNOSIS — R293 Abnormal posture: Secondary | ICD-10-CM | POA: Diagnosis not present

## 2014-09-15 DIAGNOSIS — T148 Other injury of unspecified body region: Secondary | ICD-10-CM | POA: Diagnosis not present

## 2014-09-15 NOTE — Telephone Encounter (Signed)
Close encounter 

## 2014-09-16 DIAGNOSIS — G8929 Other chronic pain: Secondary | ICD-10-CM | POA: Diagnosis not present

## 2014-09-16 DIAGNOSIS — M25551 Pain in right hip: Secondary | ICD-10-CM | POA: Diagnosis not present

## 2014-09-16 DIAGNOSIS — M545 Low back pain: Secondary | ICD-10-CM | POA: Diagnosis not present

## 2014-09-16 DIAGNOSIS — M25552 Pain in left hip: Secondary | ICD-10-CM | POA: Diagnosis not present

## 2014-09-16 NOTE — Therapy (Signed)
[facility] [address] [place], [place], [address] Phone: [phone]   Fax:  [phone]  Patient Details  Name: Denise Freeman MRN: [id number] Date of Birth: 1951/01/30 Referring Provider:  London Pepper, MD  Encounter Date: 09/15/2014 Mady Haagensen, PT 09/16/2014 1:02 PM Phone: [phone] Fax: [phone]  Mobility/Seating Evaluation      PATIENT INFORMATION: Name: Denise Freeman DOB: May 06, 1951  Sex: Female Date seen: 09/15/14 Time: 1000  Address:  [address] [place], [place]  [address] Physician: London Pepper, MD This evaluation/justification form will serve as the LMN for the following suppliers: __________________________ Supplier: [facility]  Contact Person: Luz Brazen Phone:  ?????   Seating Therapist: Mady Haagensen, PT Phone:   [phone]   Phone: [phone]    Spouse/Parent/Caregiver name: Kambryn Dapolito  Phone number: [phone] Insurance/Payer: Medicare     Reason for Referral: new power wheelchair  Patient/Caregiver Goals: Wants to have new power wheelchair to maneuver around home   MEDICAL HISTORY: Diagnosis: Primary Diagnosis: R53.1-weakness Onset: >5 years Diagnosis: CHF, COPD, fibromyalgia, neuropahty, arthritis, disc disease, DM, OSA on BiPAP, immobility, edema, a-fib, incontinence; pt on 3 L of oxygen   _0 Progressive Disease Relevant past and future surgeries: hip surgery 2013   Height: 4'11" Weight: 213 Explain recent changes or trends in weight: ?????   History including Falls: Pt has had 3 falls in the past year-one fall while trying to pick up something, one during a transfer to toilet at store.       HOME ENVIRONMENT: _1 House  _2 Condo/town home  _3 Apartment  _4 Assisted Living    _5 Lives Alone _6  Lives with Others                                                                                          Hours with caregiver: ?????  _7 Home is accessible to patient            Stairs      _8 Yes _9  No     Ramp _10 Yes _11 No Comments:  Lives in [facility]; church to build ramp; has current power wheelchair (>14 years old) which does not fit through doorways in mobile home   COMMUNITY ADL: TRANSPORTATION: _12 Car    _13 Van    <PZWCHENIDPOEUMPN>_3<\/IRWERXVQMGQQPYPP>_50 Public Transportation    _15 Adapted w/c Lift    _16 Ambulance    _17 Other:       _18 Sits in wheelchair during transport  Employment/School: ????? Specific requirements pertaining to mobility ?????  Other: ?????    FUNCTIONAL/SENSORY PROCESSING SKILLS:  Handedness:   _19 Right     _20 Left    _21 NA  Comments:  ?????  Functional Processing Skills for Wheeled Mobility _22 Processing Skills are adequate for safe wheelchair operation  Areas of concern than may interfere with safe operation of wheelchair Description of problem   _23  Attention to environment      _24 Judgment      _25  Hearing  _26  Vision or visual processing      _27 Motor Planning  _28  Fluctuations in Behavior  ?????    VERBAL COMMUNICATION: _29 WFL receptive _30  WFL expressive _31 Understandable  _32 Difficult to understand  _33 non-communicative _34  Uses an augmented  communication device           SENSATION and SKIN ISSUES: Sensation _0 Intact  _1 Impaired _2 Absent  Level of sensation: ????? Pressure Relief: Able to perform effective pressure relief :    _3 Yes  _4  No Method: change of positions  If not, Why?: ?????  Skin Issues/Skin Integrity Current Skin Issues  _5 Yes _6 No _7 Intact _8  Red area_9  Open Area  _10 Scar Tissue _11 At risk from prolonged sitting Where  ?????  History of Skin Issues  _12 Yes _13 No Where  ????? When  ?????  Hx of skin flap surgeries  _14 Yes _15 No Where  ????? When  ?????  Limited sitting tolerance _16 Yes _17 No Hours spent sitting in wheelchair daily: Sits in recliner most of the day, up to nearly 4 hours at a time before pain  Complaint of Pain:  Please describe: Pt c/o pain all over.  Pt rates pain as "at least 8 all the time"  Pain pills help to get me through.  Staying in one position and trying to walk aggravates pain.    Swelling/Edema: Pt does have increased swelling in bilateral lower extremities; normally wears compression stockings   ADL STATUS (in reference to wheelchair use):  Indep Assist Unable Indep with Equip Not assessed Comments  Dressing ????? X ????? ????? ????? ?????  Eating ????? X ????? ????? ????? Has tremors that make eating difficult  Toileting ????? ????? ????? X ????? has BSC  Bathing ????? ????? ????? X ????? uses shower chair  Grooming/Hygiene X ????? ????? ????? ????? ?????  Meal Prep ????? ????? X ????? ????? ?????  IADLS ????? ????? ????? ????? X ?????  Bowel Management: _18 Continent  _19 Incontinent  _20 Accidents Comments:  ?????  Bladder Management: _21 Continent  _22 Incontinent  _23 Accidents Comments:  Has bladder stimulator    CURRENT SEATING / MOBILITY:  Current Mobility Base:  _24 None _25 Dependent _26 Manual _27 Scooter _28 Power  Type of Control: ?????  Manufacturer:  Pt unsureSize:  ?????Age: >5 years  Current Condition of Mobility Base:  Current power wheelchair is unusable in mobile home; too large for doorways   Current Wheelchair components:  ?????  Describe posture in present seating system:  Pt arrives in power scooter; unable to safely and functionally use scooter in home due to in home to positioning for transfers and meals.    WHEELCHAIR SKILLS: Manual w/c Propulsion: _29 UE or LE strength and endurance sufficient to participate in ADLs using manual wheelchair Arm : _30 left _31 right   _32 Both      Distance: ????? Foot:  _33 left _34 right   _35 Both  Operate Scooter: _36  Strength, hand grip, balance and transfer appropriate for use _37 Living environment is accessible for use of scooter  Operate Power w/c:  _38  Std. Joystick   _39  Alternative Controls Indep _40  Assist _41  Dependent/unable _42  N/A _43   _44 Safe          _45  Functional      Distance: ?????  Bed confined without wheelchair _46  Yes _47  No   STRENGTH/RANGE OF  MOTION:  ????? Range of Motion Strength  Shoulder R shoulder flexion and L shoulder flexion 140 degrees; reports strain in neck; bilateral shoulder abduction 90 degrees; limited in internal rotation and external rotation of shoulder 3+/5 shoulder flexion bilaterally with pain  Elbow WFL 4/5 with pain  Wrist/Hand AFL 4/5 with pain  Hip limited due to pain 3/5 bilateral hip flexion  Knee WFL for knee flex and exension; painful grossly tested 3/5; pt reports knees buckle sometimes  Ankle 14 degrees into dorsiflexion on R,  10 degrees into dorsiflexion grossly tested 3/5; difficult to fully assess due to pain         MOBILITY/BALANCE:  _0  Patient is totally dependent for mobility  ?????    Balance Transfers Ambulation  Sitting Balance: Standing Balance: _1  Independent _2  Independent/Modified Independent  _3  WFL     _4  WFL _5  Supervision _6  Supervision  _7  Uses UE for balance  _8  Supervision _9  Min Assist _10  Ambulates with Assist  ?????    _11  Min Assist _12  Min assist _13  Mod Assist _14  Ambulates with Device:      _15  RW  _16  StW  _17  Cane  _18  ?????  _19  Mod Assist _20  Mod assist _21  Max assist   _22  Max Assist _23  Max assist _24  Dependent _25  Indep. Short Distance Only  _26  Unable _27  Unable _28  Lift / Sling Required Distance (in feet)  2 steps   _29  Sliding board _30  Unable to Ambulate (see explanation below)  Cardio Status:  _31 Intact  _32  Impaired   _33  NA     CHF, A-fib; HR 107 bpm during transfer  Respiratory Status:  _34 Intact   _35 Impaired   _36 NA     O2 sats drop to 85% following 2 steps during transfer; rebounds to 94% after 4 minutes  Orthotics/Prosthetics: ?????  Comments (Address manual vs power w/c vs scooter): Pt's pain and UE weakness as well as oxygen saturation limits manual wheelchair use.  Pt unable to functionally ambulate due to decreased O2 sats during transfer and due to pain.  Pt has had at least 3 falls in the past year.              Additional Comments: See additional page for  measurements           POSTURE:  Describe Reflexes/tonal influence on body: ?????   COMMENTS:   Anterior / Posterior Obliquity Rotation-Pelvis ?????        PELVIS                _37  _38  _39   Neutral Posterior Anterior  _40  _41  _42   WFL Rt elev Lt elev  _43  _44  _45   WFL Right Left                      Anterior    Anterior      _46  Fixed _47  Other _48  Partly Flexible _49  Flexible   _50  Fixed _51  Other _52  Partly Flexible  _53  Flexible  _54  Fixed _55  Other _56  Partly Flexible  _57  Flexible     TRUNK Anterior / Posterior Left Right Rotation-shoulders and upper trunk    Increased soft tissue around mid-back area; rounded shoulders    _58  _59  _60   WFL ? Thoracic ? Lumbar  Kyphosis Lordosis  _61  _62  _63   WFL Convex Convex  Right Left _64 c-curve _65 s-curve _66 multiple  _67  Neutral _68  Left-anterior _69  Right-anterior     _70  Fixed _71  Flexible _72  Partly Flexible _73  Other  _74  Fixed _75  Flexible _76  Partly Flexible _77  Other  _78  Fixed             _79  Flexible _80  Partly Flexible _81  Other    Position Windswept  ?????        HIPS                      _82            _83               _84   Neutral       Abduct        ADduct         _0           _1            _2   Neutral Right           Left      _3  Fixed _4  Subluxed _5  Partly Flexible _6  Dislocated _7  Flexible  _8  Fixed _9  Other _10  Partly Flexible  _11  Flexible                 Foot Positioning Knee Positioning  ?????    _12  WFL  _13 Lt _14 Rt _15  WFL  _16 Lt _17 Rt    KNEES ROM concerns: ROM concerns:    & Dorsi-Flexed _18 Lt _19 Rt ?????    FEET Plantar Flexed _20 Lt _21 Rt      Inversion                 _22 Lt _23 Rt      Eversion                 _24 Lt _25 Rt     HEAD _26  Functional _27  Good Head Control  increased tremors in unsupported sitting  & _28  Flexed         _29  Extended _30  Adequate Head Control    NECK _31  Rotated  Lt  _32  Lat Flexed Lt _33  Rotated  Rt _34  Lat Flexed Rt _35  Limited Head Control     _36  Cervical Hyperextension _37  Absent  Head  Control     SHOULDERS ELBOWS WRIST& HAND ?????      Left     Right    Left     Right    Left     Right   U/E _38 Functional           _39 Functional ????? ????? _40 Fisting             _41 Fisting      _42 elev   _43 dep      _44 elev   _45 dep       _46 pro -_47 retract     _48 pro  _49 retract _50 subluxed             _51 subluxed               Goals for Wheelchair Mobility  _52  Independence with mobility in the home with motor related ADLs (MRADLs)  _53  Independence with MRADLs in the community _54  Provide dependent mobility  _55  Provide recline     _56 Provide tilt   Goals for Seating system _57  Optimize pressure distribution _58  Provide support needed to facilitate function or safety _59  Provide corrective forces to assist with maintaining or improving posture _60  Accommodate client's posture:   current seated postures and positions are not flexible or will not tolerate corrective forces _61  Client to be independent with relieving pressure in the wheelchair _62 Enhance physiological function such as breathing, swallowing, digestion  Simulation ideas/Equipment trials:????? State why other equipment was unsuccessful:?????   MOBILITY BASE RECOMMENDATIONS and JUSTIFICATION: MOBILITY COMPONENT JUSTIFICATION  Manufacturer: Theatre manager: [employer]   Size: Width 16"Seat Depth 18" _63 provide transport from point A to B      _64 promote Indep mobility  _65 is not a safe, functional ambulator _66 walker or cane inadequate _67 non-standard width/depth necessary to accommodate anatomical measurement _68  ?????  _69 Manual Mobility Base _70 non-functional ambulator    _71 Scooter/POV  _72 can safely operate  _73 can safely transfer   _74 has adequate trunk stability  _75 cannot functionally  propel manual w/c  _0 Power Mobility Base  _1 non-ambulatory  _2 cannot functionally propel manual wheelchair  _3  cannot functionally and safely operate scooter/POV _4 can safely operate and willing to  _5 Stroller Base _6 infant/child  _7 unable to  propel manual wheelchair _8 allows for growth _9 non-functional ambulator _10 non-functional UE _11 Indep mobility is not a goal at this time  _12 Tilt  _13 Forward _14 Backward _15 Powered tilt  _16 Manual tilt  _17 change position against gravitational force on head and shoulders  _18 change position for pressure relief/cannot weight shift _19 transfers  _20 management of tone _21 rest periods _22 control edema _23 facilitate postural control  _24  ?????  _25 Recline  _26 Power recline on power base _27 Manual recline on manual base  _28 accommodate femur to back angle  _29 bring to full recline for ADL care  _30 change position for pressure relief/cannot weight shift _31 rest periods _32 repositioning for transfers or clothing/diaper /catheter changes _33 head positioning  _34 Lighter weight required _35 self- propulsion  _36 lifting _37  ?????  _38 Heavy Duty required _39 user weight greater than 250# _40 extreme tone/ over active movement _41 broken frame on previous chair _42  ?????  _43  Back  _44  Angle Adjustable _45  Custom molded ????? _46 postural control _47 control of tone/spasticity _48 accommodation of range of motion _49 UE functional control _50 accommodation for seating system _51  ????? _52 provide lateral trunk support _53 accommodate deformity _54 provide posterior trunk support _55 provide lumbar/sacral support _56 support trunk in midline _57 Pressure relief over spinal processes  _58  Seat Cushion ????? _59 impaired sensation  _60 decubitus ulcers present _61 history of pressure ulceration _62 prevent pelvic extension _63 low maintenance  _64 stabilize pelvis  _65 accommodate obliquity _66 accommodate multiple deformity _67 neutralize lower extremity position _68 increase pressure distribution _69  ?????  _70  Pelvic/thigh support  _71  Lateral thigh guide _72  Distal medial pad  _73  Distal lateral pad _74  pelvis in neutral _75 accommodate pelvis _76  position upper legs _77  alignment _78  accommodate ROM _79  decr adduction _80 accommodate tone _81 removable for  transfers _82 decr abduction  _83  Lateral trunk Supports _84  Lt     _85  Rt _86 decrease lateral trunk leaning _87 control tone _88 contour for increased contact _89 safety  _90 accommodate asymmetry _91  ?????  _92  Mounting hardware  _93 lateral trunk supports  _94 back   _95 seat _96 headrest      _97  thigh support _98 fixed   _99 swing away _100 attach seat platform/cushion to w/c frame _101 attach back cushion to w/c frame _102 mount postural supports _103 mount headrest  _104 swing medial thigh support away _105 swing lateral supports away for transfers  _106  ?????        Armrests  _107 fixed _108 adjustable height _109 removable   _110 swing away  _111 flip back   _112 reclining _113 full length pads _114 desk    _115 pads tubular  _116 provide support with elbow at 90   _117 provide support for w/c tray _118 change of height/angles for variable activities _119 remove for transfers _120 allow to come closer to table top _121 remove for access to tables _122  ?????  Hangers/ Leg rests  _123 60 _124 70 _125 90 _126 elevating _127 heavy duty  _128 articulating _129 fixed _130 lift off _131 swing away     _132 power _133 provide LE support  _134 accommodate to hamstring tightness _135 elevate legs during recline   _136 provide change in position for Legs _137 Maintain placement of feet on footplate _138 durability _139 enable transfers _140 decrease edema _141 Accommodate lower leg length _142  ?????  Foot support Footplate    <HQIONGEXBMWUXLKG>_4<\/WNUUVOZDGUYQIHKV>_425 Lt  _144  Rt  _145  Center mount _146 flip up     _147 depth/angle adjustable _148 Amputee adapter    _149  Lt     _150  Rt _151 provide foot support _152 accommodate to ankle ROM _153 transfers _154 Provide support for residual extremity _155  allow foot to go under wheelchair base _156  decrease tone  _157  ?????  _158  Ankle strap/heel loops _159 support foot on foot support _160   decrease extraneous movement _0 provide input to heel  _1 protect foot  Tires: _2 pneumatic  _3 flat free inserts  _4 solid  _5 decrease maintenance  _6 prevent frequent flats _7 increase shock absorbency _8 decrease pain from road shock _9 decrease spasms from  road shock _10  ?????  _11  Headrest  _12 provide posterior head support _13 provide posterior neck support _14 provide lateral head support _15 provide anterior head support _16 support during tilt and recline _17 improve feeding   _18 improve respiration _19 placement of switches _20 safety  _21 accommodate ROM  _22 accommodate tone _23 improve visual orientation  _24  Anterior chest strap _25  Vest _26  Shoulder retractors  _27 decrease forward movement of shoulder _28 accommodation of TLSO _29 decrease forward movement of trunk _30 decrease shoulder elevation _31 added abdominal support _32 alignment _33 assistance with shoulder control  _34  ?????  Pelvic Positioner _35 Belt _36 SubASIS bar _37 Dual Pull _38 stabilize tone _39 decrease falling out of chair/ **will not Decr potential for sliding due to pelvic tilting _40 prevent excessive rotation _41 pad for protection over boney prominence _42 prominence comfort _43 special pull angle to control rotation _44  ?????  Upper Extremity Support _45 L   _46  R _47 Arm trough    _48 hand support _49  tray       _50 full tray _51 swivel mount _52 decrease edema      _53 decrease subluxation   _54 control tone   _55 placement for AAC/Computer/EADL _56 decrease gravitational pull on shoulders _57 provide midline positioning _58 provide support to increase UE function _59 provide hand support in natural position _60 provide work surface   POWER WHEELCHAIR CONTROLS  _61 Proportional  _62 Non-Proportional Type joystick _63 Left  _64 Right _65 provides access for controlling wheelchair   _66 lacks motor control to operate proportional drive control <ZOXWRUEAVWUJWJXB>_1<\/YNWGNFAOZHYQMVHQ>_46 unable to understand proportional controls  Actuator Control Module  _68 Single  _69 Multiple   _70 Allow the client to operate the power seat function(s) through the joystick control   _71 Safety Reset Switches _72 Used to change modes and stop the wheelchair when driving in latch mode    _73 [employer]   _74 programming for accurate control _75 progressive Disease/changing  condition _76 non-proportional drive control needed _77 Needed in order to operate power seat functions through joystick control   _78 Display box _79 Allows user to see in which mode and drive the wheelchair is set  _80 necessary for alternate controls    _81 Digital interface electronics _82 Allows w/c to operate when using alternative drive controls  <NGEXBMWUXLKGMWNU>_2<\/VOZDGUYQIHKVQQVZ>_56 ASL Head Array _84 Allows client to operate wheelchair  through switches placed in tri-panel headrest  _85 Sip and puff with tubing kit _86 needed to operate sip and puff drive controls  <LOVFIEPPIRJJOACZ>_6<\/SAYTKZSWFUXNATFT>_73 Upgraded tracking electronics _88 increase safety when driving <UKGURKYHCWCBJSEG>_3<\/TDVVOHYWVPXTGGYI>_94 correct tracking when on uneven surfaces  _90 [facility] for switches or joystick _91 Attaches switches to w/c  _92 Swing away for access or transfers _93 midline for optimal placement _94 provides for consistent access  _95 Attendant controlled joystick plus mount _96 safety _97 long distance driving <WNIOEVOJJKKXFGHW>_2<\/XHBZJIRCVELFYBOF>_75 operation of seat functions _99 compliance with transportation regulations _100  ?????        Rear wheel placement/Axle adjustability _101 None _102 semi adjustable _103 fully adjustable  _104 improved UE access to wheels _105 improved stability _106 changing angle in space for improvement of postural stability _107 1-arm drive access <ZWCHENIDPOEUMPNT>_6<\/RWERXVQMGQQPYPPJ>_093 amputee pad placement _109  ?????  Wheel rims/ hand rims  _110 metal  _111 plastic coated _112 oblique projections _113 vertical projections _114 Provide ability to propel manual wheelchair  _115  Increase self-propulsion with hand weakness/decreased grasp  Push handles _116 extended  _117 angle adjustable  _118 standard _119 caregiver access _120 caregiver assist _121 allows "hooking" to enable increased ability to perform ADLs or maintain balance  One armed device  _122 Lt   _123 Rt _124 enable propulsion of manual wheelchair with one arm   _125  ?????   Brake/wheel lock extension _126  Lt   _127  Rt _128 increase indep in applying wheel  locks   _0 Side guards _1 prevent clothing getting caught in wheel or becoming soiled _2  prevent skin tears/abrasions  Battery: U1 x 2 _3 to  power wheelchair ?????  Other: ????? ????? ?????  The above equipment has a life- long use expectancy. Growth and changes in medical and/or functional conditions would be the exceptions. This is to certify that the therapist has no financial relationship with durable medical provider or manufacturer. The therapist will not receive remuneration of any kind for the equipment recommended in this evaluation.   Patient has mobility limitation that significantly impairs safe, timely participation in one or more mobility related ADL's.  (bathing, toileting, feeding, dressing, grooming, moving from room to room)                                                             _4  Yes _5  No Will mobility device sufficiently improve ability to participate and/or be aided in participation of MRADL's?         _6  Yes _7  No Can limitation be compensated for with use of a cane or walker?                                                                                _8  Yes _9  No Does patient or caregiver demonstrate ability/potential ability & willingness to safely use the mobility device?   _10  Yes _11  No Does patient's home environment support use of recommended mobility device?                                                    _12  Yes _13  No Does patient have sufficient upper extremity function necessary to functionally propel a manual wheelchair?    _14  Yes _15  No Does patient have sufficient strength and trunk stability to safely operate a POV (scooter)?                                  _16  Yes _17  No Does patient need additional features/benefits provided by a power wheelchair for MRADL's in the home?       _18  Yes _19  No Does the patient demonstrate the ability to safely use a power wheelchair?                                                              _20  Yes _21  No  Therapist Name Printed: ????? Date: ?????  Therapist's Signature:   Date:   Supplier's Name Printed: ????? Date: ?????  Supplier's Signature:    Date:  Patient/Caregiver Signature:   Date:  This is to certify that I have read this evaluation and do agree with the content within:    Physician's Name Printed: ?????  Physician's Signature:  Date:     This is to certify that I, the above signed therapist have the following affiliations: _0  This DME provider _1  Manufacturer of recommended equipment _2  [name] long term care facility _3  None of the above    Denise Freeman W. 09/16/2014, 12:57 PM  [facility] [address] [place], [place], [address] Phone: [phone]   Fax:  [phone]

## 2014-09-20 ENCOUNTER — Ambulatory Visit (INDEPENDENT_AMBULATORY_CARE_PROVIDER_SITE_OTHER): Payer: Medicare Other | Admitting: Pulmonary Disease

## 2014-09-20 ENCOUNTER — Encounter: Payer: Self-pay | Admitting: Pulmonary Disease

## 2014-09-20 VITALS — BP 126/70 | HR 76 | Temp 98.0°F | Ht 59.0 in | Wt 221.6 lb

## 2014-09-20 DIAGNOSIS — R0600 Dyspnea, unspecified: Secondary | ICD-10-CM | POA: Diagnosis not present

## 2014-09-20 DIAGNOSIS — J9612 Chronic respiratory failure with hypercapnia: Secondary | ICD-10-CM

## 2014-09-20 MED ORDER — IPRATROPIUM-ALBUTEROL 0.5-2.5 (3) MG/3ML IN SOLN: 3.0000 mL | Freq: Four times a day (QID) | RESPIRATORY_TRACT | Status: DC

## 2014-09-20 NOTE — Progress Notes (Signed)
   Subjective:    Patient ID: Denise Freeman, female    DOB: 11/12/1950, 64 y.o.   MRN: [id number]  HPI The patient is a 64 year old female who I've been asked to see for management of COPD. She has moved here from [place], and was told by her pulmonologist there that she had severe COPD. She has known chronic respiratory failure with both hypoxic and hypercarbic component, and likely has obesity hypoventilation syndrome. She also has known chronic heart failure, with a chest x-ray last month showing pulmonary edema and a layering right pleural effusion. The patient wishes to establish with a pulmonologist in [place]. She is currently using nebulized albuterol 4 times a day, and Atrovent nebulized as needed. She has been tried on inhalers in the past and had a difficult time using them. He also were not effective. She has significant dyspnea at rest, and with any exertion. she is currently using a scooter to be able to get around.  She also has a history of atrial fibrillation. Unfortunately, she has no pulmonary function studies here to review, and missed her scheduled appointment.   Review of Systems  Constitutional: Negative for fever and unexpected weight change.  HENT: Positive for congestion and postnasal drip. Negative for dental problem, ear pain, nosebleeds, rhinorrhea, sinus pressure, sneezing, sore throat and trouble swallowing.   Eyes: Negative for redness and itching.  Respiratory: Positive for chest tightness, shortness of breath and wheezing. Negative for cough.   Cardiovascular: Positive for leg swelling. Negative for palpitations.  Gastrointestinal: Negative for nausea and vomiting.  Genitourinary: Negative for dysuria.  Musculoskeletal: Negative for joint swelling.  Skin: Negative for rash.  Neurological: Negative for headaches.  Hematological: Does not bruise/bleed easily.  Psychiatric/Behavioral: Negative for dysphoric mood. The patient is not nervous/anxious.          Objective:   Physical Exam Constitutional:  Morbidly obese female no acute distress  HENT:  Nares patent without discharge  Oropharynx without exudate, palate and uvula are elongated  Eyes:  Perrla, eomi, no scleral icterus  Neck:  No JVD, no TMG  Cardiovascular:  Normal rate, regular rhythm, no rubs or gallops.  No murmurs        Intact distal pulses  Pulmonary :  Very poor inspiratory effort, no stridor or respiratory distress   No rales, rhonchi, or wheezing.  Very diminished bs  Abdominal:  Soft, nondistended, bowel sounds present.  No tenderness noted.   Musculoskeletal:  2+ lower extremity edema noted.  Lymph Nodes:  No cervical lymphadenopathy noted  Skin:  No cyanosis noted  Neurologic:  Alert, appropriate, moves all 4 extremities without obvious deficit.         Assessment & Plan:

## 2014-09-20 NOTE — Assessment & Plan Note (Signed)
The patient has severe dyspnea on exertion that is multifactorial. She tells me that she has been diagnosed with severe COPD, but never had her scheduled pulmonary function studies. Do not have her pass PFTs from [place]. She also has morbid obesity with sleep apnea, and I suspect she has obesity hypoventilation syndrome given her hypercarbia. Finally, she has known systolic and diastolic heart failure, and had a chest x-ray last month that showed pulmonary edema and a small right pleural effusion. She has chronic lower extremity edema, and is being followed by cardiology. At this point, I will keep her on nebulized bronchodilators since she has not done well with inhalers in the past. Will treat her with DuoNeb until I can see her PFTs, and if she does indeed have severe COPD, will add budesonide.

## 2014-09-20 NOTE — Patient Instructions (Signed)
Stop your daily albuterol nebs and atrovent. Start on duonebs one treatment 4 times a day everyday, and can take 2 additional treatments if having a bad day. Will schedule for breathing studies.  Depending upon the results, may add another medication to your nebulizer.  Will call you with results. Increase your PULSED oxygen to 4 liters, but can use less if continuous. Watch your fluid balance carefully, and let your cardiologist know if gaining weight or fluid.  followup again at appt scheduled for September, but call if you are having breathing issues.

## 2014-09-21 ENCOUNTER — Other Ambulatory Visit: Payer: Self-pay | Admitting: Pulmonary Disease

## 2014-09-21 DIAGNOSIS — J9612 Chronic respiratory failure with hypercapnia: Secondary | ICD-10-CM

## 2014-09-21 MED ORDER — IPRATROPIUM-ALBUTEROL 0.5-2.5 (3) MG/3ML IN SOLN: 3.0000 mL | Freq: Four times a day (QID) | RESPIRATORY_TRACT | Status: DC

## 2014-09-22 ENCOUNTER — Telehealth: Payer: Self-pay | Admitting: Pulmonary Disease

## 2014-09-22 DIAGNOSIS — G4733 Obstructive sleep apnea (adult) (pediatric): Secondary | ICD-10-CM

## 2014-09-22 NOTE — Telephone Encounter (Signed)
Pt calling states that she is wanting to change to a full face mask. Unable to tolerate her current mask and has tried her husbands chin strap and has been unable to tolerate that as well d/t it causing raw spots under chin. DME [facility] Please advise Dr Gwenette Greet. Thanks.

## 2014-09-23 NOTE — Telephone Encounter (Signed)
ATC multiple times, busy signal wcb Order sent to DME

## 2014-09-23 NOTE — Telephone Encounter (Signed)
Pt recalled call - [phone]

## 2014-09-23 NOTE — Telephone Encounter (Signed)
ATC pt. Line rang several times without VM. WCB.

## 2014-09-23 NOTE — Telephone Encounter (Signed)
Ok with me to send order.

## 2014-09-23 NOTE — Telephone Encounter (Signed)
Pt aware order was sent in

## 2014-09-28 DIAGNOSIS — J449 Chronic obstructive pulmonary disease, unspecified: Secondary | ICD-10-CM | POA: Diagnosis not present

## 2014-09-28 DIAGNOSIS — I509 Heart failure, unspecified: Secondary | ICD-10-CM | POA: Diagnosis not present

## 2014-09-28 DIAGNOSIS — R251 Tremor, unspecified: Secondary | ICD-10-CM | POA: Diagnosis not present

## 2014-09-28 DIAGNOSIS — L309 Dermatitis, unspecified: Secondary | ICD-10-CM | POA: Diagnosis not present

## 2014-10-04 ENCOUNTER — Telehealth: Payer: Self-pay | Admitting: Internal Medicine

## 2014-10-04 DIAGNOSIS — E119 Type 2 diabetes mellitus without complications: Secondary | ICD-10-CM | POA: Diagnosis not present

## 2014-10-04 DIAGNOSIS — H2513 Age-related nuclear cataract, bilateral: Secondary | ICD-10-CM | POA: Diagnosis not present

## 2014-10-04 DIAGNOSIS — H25013 Cortical age-related cataract, bilateral: Secondary | ICD-10-CM | POA: Diagnosis not present

## 2014-10-04 DIAGNOSIS — H2511 Age-related nuclear cataract, right eye: Secondary | ICD-10-CM | POA: Diagnosis not present

## 2014-10-04 DIAGNOSIS — Z794 Long term (current) use of insulin: Secondary | ICD-10-CM | POA: Diagnosis not present

## 2014-10-04 DIAGNOSIS — H25011 Cortical age-related cataract, right eye: Secondary | ICD-10-CM | POA: Diagnosis not present

## 2014-10-04 NOTE — Telephone Encounter (Signed)
Left pt a voice message to return call. I do not know which medication she is talking about. [facility] in [place] did not have anything and [facility] in [place] said she called in a refill of Lantus.

## 2014-10-04 NOTE — Telephone Encounter (Signed)
Error

## 2014-10-04 NOTE — Telephone Encounter (Signed)
Patient stated that [facility] called about insulin humulin, need clarification of medication, please advise

## 2014-10-04 NOTE — Telephone Encounter (Signed)
Patient stated that pharmacy [facility], need clariftication of medication pleasadvise

## 2014-10-05 ENCOUNTER — Telehealth: Payer: Self-pay | Admitting: Internal Medicine

## 2014-10-05 ENCOUNTER — Encounter: Payer: Self-pay | Admitting: Neurology

## 2014-10-05 NOTE — Telephone Encounter (Signed)
Low risk for surgery - current guidelines do not require cardiac clearance for cataract surgery.   Dr. Lemmie Evens

## 2014-10-05 NOTE — Telephone Encounter (Signed)
Pt. Needs clearance for cataract surgery . Please advise

## 2014-10-05 NOTE — Telephone Encounter (Signed)
Pt needs to have cataract surgery,she needs clarence for this.

## 2014-10-05 NOTE — Telephone Encounter (Signed)
Message passed on to pt

## 2014-10-12 DIAGNOSIS — I509 Heart failure, unspecified: Secondary | ICD-10-CM | POA: Diagnosis not present

## 2014-10-12 DIAGNOSIS — R531 Weakness: Secondary | ICD-10-CM | POA: Diagnosis not present

## 2014-10-12 DIAGNOSIS — J449 Chronic obstructive pulmonary disease, unspecified: Secondary | ICD-10-CM | POA: Diagnosis not present

## 2014-10-12 DIAGNOSIS — T148 Other injury of unspecified body region: Secondary | ICD-10-CM | POA: Diagnosis not present

## 2014-10-13 ENCOUNTER — Encounter: Payer: Self-pay | Admitting: Internal Medicine

## 2014-10-13 ENCOUNTER — Ambulatory Visit (INDEPENDENT_AMBULATORY_CARE_PROVIDER_SITE_OTHER): Payer: Medicare Other | Admitting: Internal Medicine

## 2014-10-13 VITALS — BP 112/62 | HR 87 | Ht 59.0 in | Wt 226.7 lb

## 2014-10-13 DIAGNOSIS — E1322 Other specified diabetes mellitus with diabetic chronic kidney disease: Secondary | ICD-10-CM

## 2014-10-13 DIAGNOSIS — N183 Chronic kidney disease, stage 3 unspecified: Secondary | ICD-10-CM

## 2014-10-13 DIAGNOSIS — I482 Chronic atrial fibrillation, unspecified: Secondary | ICD-10-CM

## 2014-10-13 DIAGNOSIS — Z79899 Other long term (current) drug therapy: Secondary | ICD-10-CM | POA: Diagnosis not present

## 2014-10-13 DIAGNOSIS — I5032 Chronic diastolic (congestive) heart failure: Secondary | ICD-10-CM

## 2014-10-13 DIAGNOSIS — R0602 Shortness of breath: Secondary | ICD-10-CM

## 2014-10-13 DIAGNOSIS — R0600 Dyspnea, unspecified: Secondary | ICD-10-CM

## 2014-10-13 DIAGNOSIS — IMO0002 Reserved for concepts with insufficient information to code with codable children: Secondary | ICD-10-CM

## 2014-10-13 DIAGNOSIS — E1329 Other specified diabetes mellitus with other diabetic kidney complication: Secondary | ICD-10-CM

## 2014-10-13 DIAGNOSIS — E1365 Other specified diabetes mellitus with hyperglycemia: Secondary | ICD-10-CM

## 2014-10-13 DIAGNOSIS — R600 Localized edema: Secondary | ICD-10-CM | POA: Insufficient documentation

## 2014-10-13 MED ORDER — FUROSEMIDE 40 MG PO TABS: 60.0000 mg | ORAL_TABLET | Freq: Two times a day (BID) | ORAL | Status: DC

## 2014-10-13 MED ORDER — METOLAZONE 2.5 MG PO TABS: 2.5000 mg | ORAL_TABLET | Freq: Every day | ORAL | Status: DC

## 2014-10-13 MED ORDER — DILTIAZEM HCL 30 MG PO TABS: 30.0000 mg | ORAL_TABLET | Freq: Two times a day (BID) | ORAL | Status: DC

## 2014-10-13 NOTE — Patient Instructions (Signed)
MEDICATION CHANGES 1. INCREASE FUROSEMIDE TO 1 AND 1/2 TABLETS OR 60MG  TWICE DAILY 2. INCREASE METOLAZONE TO EVERY DAY  Your physician recommends that you return for lab work in: [place] physician recommends that you schedule a follow-up appointment in: 1 month with [name]

## 2014-10-13 NOTE — Progress Notes (Signed)
OFFICE NOTE  Chief Complaint:  Leg swelling and weight gain   Primary Care Physician: London Pepper, MD  HPI:  Denise Freeman is a pleasant 64 year old female who is establishing cardiac care today. She is accompanied by her husband and they recently moved here from [place], [place]. She has a history of severe COPD on home oxygen. She also has a history of stress-induced cardiomyopathy in the past. She has since had recovery of her EF. She's had numerous cardiac catheterizations. None of which showed obstructive coronary disease. Her last cardiac catheterization was in October 2014 which demonstrated no significant coronary disease. This was after a small abnormality was noted in the apex suggestive of ischemia on a nuclear stress test. She does have a history of permanent atrial fibrillation on Coumadin. She will need to have her INRs followed here. Unfortunate she has not had her INR checked in over 9 weeks and it was assessed today and was low. We will need to adjust her medication. She will be established in our anticoagulation clinic. Blood pressure looks well controlled today. She is on cholesterol medication.  I saw [name] back today in the office. Unfortunate she was hospitalized in December for worsening shortness of breath and probable pneumonia with a mild diastolic heart failure exacerbation. She was given IV diuretics and her Lasix was increased to 40 mg 3 times a day. She's also taking metolazone 3 times weekly. She continues to complain of leg edema which is mostly dependent. She has significant shortness of breath and CO2 retention and an appointment with a pulmonologist is pending.  [name] returned today back in the office. Her main complaints are a number of excoriations and weeping lesions on her face and arms. She apparently is going to see a dermatologist about this. She's also had worsening leg swelling and shortness of breath. Her weight is now up about 8 pounds  since her last visit. She was instructed to take Lasix 40 mg 3 times a day but apparently she is taking it twice a day. She is taking the metolazone 3 times a week. She is wearing compression stockings which I had advised and seems that this helps her swelling.  PMHx:  Past Medical History  Diagnosis Date  . Diabetes mellitus without complication   . Bipolar 1 disorder   . Fibromyalgia   . COPD (chronic obstructive pulmonary disease)   . CHF (congestive heart failure)   . Asthma   . Atrial fibrillation   . Neuropathy     Disc Back   . Fibromyalgia   . CHF (congestive heart failure)   . Coronary artery disease   . Anemia   . History of hiatal hernia   . GERD (gastroesophageal reflux disease)   . Myocardial infarction 1990  . Shortness of breath dyspnea     Past Surgical History  Procedure Laterality Date  . Heel spur surgery    . Appendectomy    . Hernia repair    . Bladder suspension      2003, 2006 and 2010  . Back surgery    . Neck surgery N/A 2009    4, 6, and 7 cervical disc replaced    FAMHx:  Family History  Problem Relation Age of Onset  . Heart disease Mother   . COPD Mother   . Cancer Mother     Breast cancer  . Diabetes Mother   . Heart disease Father   . COPD Sister   .  Heart disease Sister   . Diabetes Sister   . Heart disease Maternal Grandmother     SOCHx:   reports that she quit smoking about 25 years ago. Her smoking use included Cigarettes. She has a 28 pack-year smoking history. She does not have any smokeless tobacco history on file. She reports that she does not drink alcohol or use illicit drugs.  ALLERGIES:  Allergies  Allergen Reactions  . Ancef [Cefazolin] Nausea And Vomiting  . Augmentin [Amoxicillin-Pot Clavulanate] Nausea Only  . Ciprofloxacin Nausea And Vomiting  . Haldol [Haloperidol] Other (See Comments)    Restless leg  . Levaquin [Levofloxacin In D5w] Other (See Comments)    "afib"  . Nsaids Diarrhea  . Tamiflu  [Oseltamivir Phosphate] Other (See Comments)    "water blisters"  . Zoloft [Sertraline Hcl] Other (See Comments)    Jaw problems    ROS: A comprehensive review of systems was negative except for: Respiratory: positive for dyspnea on exertion Cardiovascular: positive for lower extremity edema Integument/breast: positive for skin lesion(s) Musculoskeletal: positive for myalgias  HOME MEDS: Current Outpatient Prescriptions  Medication Sig Dispense Refill  . albuterol (PROVENTIL) (2.5 MG/3ML) 0.083% nebulizer solution Take 2.5 mg by nebulization every 6 (six) hours as needed for wheezing or shortness of breath.    Marland Kitchen atorvastatin (LIPITOR) 20 MG tablet Take 1 tablet (20 mg total) by mouth daily. 30 tablet 1  . cetirizine (ZYRTEC) 10 MG tablet Take 10 mg by mouth daily.    Marland Kitchen diltiazem (CARDIZEM) 30 MG tablet Take 1 tablet (30 mg total) by mouth 2 (two) times daily. 180 tablet 1  . docusate sodium (COLACE) 100 MG capsule Take 100 mg by mouth daily.    Marland Kitchen escitalopram (LEXAPRO) 10 MG tablet Take 10 mg by mouth at bedtime.     . famotidine (PEPCID) 40 MG tablet Take 40 mg by mouth 2 (two) times daily.    . fenofibrate 54 MG tablet Take 1 tablet (54 mg total) by mouth daily. 90 tablet 1  . fluticasone (FLONASE) 50 MCG/ACT nasal spray Place 1 spray into both nostrils 2 (two) times daily.    . furosemide (LASIX) 40 MG tablet Take 1.5 tablets (60 mg total) by mouth 2 (two) times daily. 270 tablet 1  . gabapentin (NEURONTIN) 300 MG capsule Take 900 mg by mouth 3 (three) times daily.     . Insulin Glargine (TOUJEO SOLOSTAR) 300 UNIT/ML SOPN Inject 60 Units into the skin at bedtime. 18 pen 1  . insulin lispro (HUMALOG KWIKPEN) 100 UNIT/ML KiwkPen Inject 0.18-0.24 mLs (18-24 Units total) into the skin 3 (three) times daily. 20 pen 1  . Insulin Pen Needle (CAREFINE PEN NEEDLES) 31G X 8 MM MISC Use to inject insulin 4 times daily. 360 each 3  . ipratropium (ATROVENT) 0.02 % nebulizer solution Take 0.5 mg by  nebulization 2 (two) times daily.    Marland Kitchen ipratropium-albuterol (DUONEB) 0.5-2.5 (3) MG/3ML SOLN Take 3 mLs by nebulization 4 (four) times daily. May take an additional 2 treatments if having a bad day DX J96.12 360 mL 6  . iron polysaccharides (NIFEREX) 150 MG capsule Take 1 capsule (150 mg total) by mouth 2 (two) times daily. 60 capsule 0  . metolazone (ZAROXOLYN) 2.5 MG tablet Take 1 tablet (2.5 mg total) by mouth daily. 90 tablet 1  . mirtazapine (REMERON) 15 MG tablet Take 15 mg by mouth at bedtime.    . modafinil (PROVIGIL) 200 MG tablet Take 200 mg by mouth daily.    Marland Kitchen  nystatin (MYCOSTATIN/NYSTOP) 100000 UNIT/GM POWD Apply topically 2 (two) times daily.    . ondansetron (ZOFRAN-ODT) 4 MG disintegrating tablet Take 4 mg by mouth every 8 (eight) hours as needed for nausea or vomiting.    Marland Kitchen oxyCODONE (OXY IR/ROXICODONE) 5 MG immediate release tablet Take 5 mg by mouth 2 (two) times daily.    . OXYGEN Inhale into the lungs continuous. 3 liters 24/7    . pantoprazole (PROTONIX) 40 MG tablet Take 1 tablet (40 mg total) by mouth daily. 30 tablet 0  . potassium chloride SA (K-DUR,KLOR-CON) 20 MEQ tablet Take 40 mEq by mouth daily.     . pregabalin (LYRICA) 50 MG capsule Take 50 mg by mouth 3 (three) times daily.    . rivaroxaban (XARELTO) 20 MG TABS tablet Take 1 tablet (20 mg total) by mouth daily with supper. 90 tablet 1   No current facility-administered medications for this visit.    LABS/IMAGING: No results found for this or any previous visit (from the past 48 hour(s)). No results found.  VITALS: BP 112/62 mmHg  Pulse 87  Ht 4\' 11"  (1.499 m)  Wt 226 lb 11.2 oz (102.83 kg)  BMI 45.76 kg/m2  EXAM: General appearance: alert, appears older than stated age and no distress Neck: JVD - 3 cm above sternal notch and no carotid bruit Lungs: diminished breath sounds bilaterally and wheezes bilaterally Heart: regular rate and rhythm, S1, S2 normal, no murmur, click, rub or gallop Abdomen:  soft, non-tender; bowel sounds normal; no masses,  no organomegaly and obese Extremities: edema 1+ bilateral, stockings in place Pulses: 2+ and symmetric Skin: Numerous, weeping lesions Neurologic: Grossly normal Psych: Somewhat manic  EKG: Atrial fibrillation with controlled ventricular response at 87  ASSESSMENT: 1. Permanent atrial fibrillation on warfarin 2. Acute on chronic diastolic congestive heart failure 3. History of stress-induced cardiomyopathy with normalization of her EF 4. No significant obstructive coronary disease after multiple catheterizations in 1996, 99, 2007 and 2014. 5. Fibromyalgia 6. Bipolar 1 disorder 7. Dyslipidemia 8. Hypertension 9. Severe COPD on home oxygen 10. Obstructive sleep apnea on CPAP  PLAN: 1.   Mrs. Braley has had some increased weight over the past several months as well as worsening lower extremity swelling. She's using compression stockings with pretty good results however not taking is much Lasix as she was supposed to take when discharged from the hospital. I'm recommending that we increase her Lasix today to 60 mg twice a day. I also recommended her taking metolazone every day, specifically prior to her morning dose by at least 30-60 minutes. She should continue compression stockings. Rate control of her A. fib is reasonable. Plan to see her back in a month to see if she's had improvement in her symptoms and decrease in weight and swelling. I will go ahead and check a metabolic profile and BNP next week.  Pixie Casino, MD, [facility] Attending Cardiologist [place] 10/13/2014, 12:52 PM

## 2014-10-14 DIAGNOSIS — M79605 Pain in left leg: Secondary | ICD-10-CM | POA: Diagnosis not present

## 2014-10-14 DIAGNOSIS — M79604 Pain in right leg: Secondary | ICD-10-CM | POA: Diagnosis not present

## 2014-10-14 DIAGNOSIS — M25562 Pain in left knee: Secondary | ICD-10-CM | POA: Diagnosis not present

## 2014-10-14 DIAGNOSIS — M25561 Pain in right knee: Secondary | ICD-10-CM | POA: Diagnosis not present

## 2014-10-14 DIAGNOSIS — Z79891 Long term (current) use of opiate analgesic: Secondary | ICD-10-CM | POA: Diagnosis not present

## 2014-10-14 DIAGNOSIS — M25552 Pain in left hip: Secondary | ICD-10-CM | POA: Diagnosis not present

## 2014-10-14 DIAGNOSIS — G8929 Other chronic pain: Secondary | ICD-10-CM | POA: Diagnosis not present

## 2014-10-14 DIAGNOSIS — M25551 Pain in right hip: Secondary | ICD-10-CM | POA: Diagnosis not present

## 2014-10-14 DIAGNOSIS — M545 Low back pain: Secondary | ICD-10-CM | POA: Diagnosis not present

## 2014-10-18 ENCOUNTER — Ambulatory Visit: Payer: Medicare Other | Admitting: Neurology

## 2014-10-20 ENCOUNTER — Emergency Department (HOSPITAL_COMMUNITY)
Admission: EM | Admit: 2014-10-20 | Discharge: 2014-10-20 | Disposition: A | Discharge status: Home / Self Care | Payer: Medicare Other | Attending: Emergency Medicine | Admitting: Emergency Medicine

## 2014-10-20 ENCOUNTER — Emergency Department (HOSPITAL_COMMUNITY): Payer: Medicare Other

## 2014-10-20 ENCOUNTER — Encounter (HOSPITAL_COMMUNITY): Payer: Self-pay | Admitting: *Deleted

## 2014-10-20 DIAGNOSIS — R531 Weakness: Secondary | ICD-10-CM | POA: Diagnosis not present

## 2014-10-20 DIAGNOSIS — Z87891 Personal history of nicotine dependence: Secondary | ICD-10-CM | POA: Diagnosis not present

## 2014-10-20 DIAGNOSIS — R0602 Shortness of breath: Secondary | ICD-10-CM | POA: Diagnosis not present

## 2014-10-20 DIAGNOSIS — F319 Bipolar disorder, unspecified: Secondary | ICD-10-CM | POA: Insufficient documentation

## 2014-10-20 DIAGNOSIS — G629 Polyneuropathy, unspecified: Secondary | ICD-10-CM | POA: Diagnosis not present

## 2014-10-20 DIAGNOSIS — I2510 Atherosclerotic heart disease of native coronary artery without angina pectoris: Secondary | ICD-10-CM | POA: Insufficient documentation

## 2014-10-20 DIAGNOSIS — K219 Gastro-esophageal reflux disease without esophagitis: Secondary | ICD-10-CM | POA: Insufficient documentation

## 2014-10-20 DIAGNOSIS — Z862 Personal history of diseases of the blood and blood-forming organs and certain disorders involving the immune mechanism: Secondary | ICD-10-CM | POA: Diagnosis not present

## 2014-10-20 DIAGNOSIS — Z7951 Long term (current) use of inhaled steroids: Secondary | ICD-10-CM | POA: Diagnosis not present

## 2014-10-20 DIAGNOSIS — Z794 Long term (current) use of insulin: Secondary | ICD-10-CM | POA: Insufficient documentation

## 2014-10-20 DIAGNOSIS — I509 Heart failure, unspecified: Secondary | ICD-10-CM | POA: Insufficient documentation

## 2014-10-20 DIAGNOSIS — I252 Old myocardial infarction: Secondary | ICD-10-CM | POA: Insufficient documentation

## 2014-10-20 DIAGNOSIS — Z4800 Encounter for change or removal of nonsurgical wound dressing: Secondary | ICD-10-CM | POA: Insufficient documentation

## 2014-10-20 DIAGNOSIS — J441 Chronic obstructive pulmonary disease with (acute) exacerbation: Secondary | ICD-10-CM | POA: Diagnosis not present

## 2014-10-20 DIAGNOSIS — Z7901 Long term (current) use of anticoagulants: Secondary | ICD-10-CM | POA: Diagnosis not present

## 2014-10-20 DIAGNOSIS — Z5189 Encounter for other specified aftercare: Secondary | ICD-10-CM

## 2014-10-20 DIAGNOSIS — E119 Type 2 diabetes mellitus without complications: Secondary | ICD-10-CM | POA: Diagnosis not present

## 2014-10-20 DIAGNOSIS — Z79899 Other long term (current) drug therapy: Secondary | ICD-10-CM | POA: Diagnosis not present

## 2014-10-20 DIAGNOSIS — I4891 Unspecified atrial fibrillation: Secondary | ICD-10-CM | POA: Diagnosis not present

## 2014-10-20 DIAGNOSIS — R21 Rash and other nonspecific skin eruption: Secondary | ICD-10-CM | POA: Diagnosis present

## 2014-10-20 LAB — BRAIN NATRIURETIC PEPTIDE: B NATRIURETIC PEPTIDE 5: 151 pg/mL — AB (ref 0.0–100.0)

## 2014-10-20 LAB — TROPONIN I

## 2014-10-20 LAB — COMPREHENSIVE METABOLIC PANEL
ALT: 9 U/L — ABNORMAL LOW (ref 14–54)
ANION GAP: 10 (ref 5–15)
AST: 15 U/L (ref 15–41)
Albumin: 3.7 g/dL (ref 3.5–5.0)
Alkaline Phosphatase: 96 U/L (ref 38–126)
BUN: 25 mg/dL — ABNORMAL HIGH (ref 6–20)
CHLORIDE: 88 mmol/L — AB (ref 101–111)
CO2: 41 mmol/L — AB (ref 22–32)
CREATININE: 0.91 mg/dL (ref 0.44–1.00)
Calcium: 8.5 mg/dL — ABNORMAL LOW (ref 8.9–10.3)
GLUCOSE: 163 mg/dL — AB (ref 65–99)
Potassium: 3.4 mmol/L — ABNORMAL LOW (ref 3.5–5.1)
Sodium: 139 mmol/L (ref 135–145)
Total Bilirubin: 0.5 mg/dL (ref 0.3–1.2)
Total Protein: 8.2 g/dL — ABNORMAL HIGH (ref 6.5–8.1)

## 2014-10-20 LAB — CBC WITH DIFFERENTIAL/PLATELET
BASOS PCT: 0 % (ref 0–1)
Basophils Absolute: 0 10*3/uL (ref 0.0–0.1)
Eosinophils Absolute: 0.2 10*3/uL (ref 0.0–0.7)
Eosinophils Relative: 2 % (ref 0–5)
HCT: 35.5 % — ABNORMAL LOW (ref 36.0–46.0)
Hemoglobin: 10.2 g/dL — ABNORMAL LOW (ref 12.0–15.0)
LYMPHS ABS: 1.9 10*3/uL (ref 0.7–4.0)
Lymphocytes Relative: 15 % (ref 12–46)
MCH: 23.9 pg — ABNORMAL LOW (ref 26.0–34.0)
MCHC: 28.7 g/dL — AB (ref 30.0–36.0)
MCV: 83.3 fL (ref 78.0–100.0)
MONOS PCT: 4 % (ref 3–12)
Monocytes Absolute: 0.5 10*3/uL (ref 0.1–1.0)
Neutro Abs: 9.6 10*3/uL — ABNORMAL HIGH (ref 1.7–7.7)
Neutrophils Relative %: 79 % — ABNORMAL HIGH (ref 43–77)
Platelets: 324 10*3/uL (ref 150–400)
RBC: 4.26 MIL/uL (ref 3.87–5.11)
RDW: 15.5 % (ref 11.5–15.5)
WBC: 12.3 10*3/uL — ABNORMAL HIGH (ref 4.0–10.5)

## 2014-10-20 LAB — PROTIME-INR
INR: 1.76 — ABNORMAL HIGH (ref 0.00–1.49)
Prothrombin Time: 20.5 seconds — ABNORMAL HIGH (ref 11.6–15.2)

## 2014-10-20 MED ORDER — DOXYCYCLINE HYCLATE 100 MG PO CAPS: 100.0000 mg | ORAL_CAPSULE | Freq: Two times a day (BID) | ORAL | Status: DC

## 2014-10-20 NOTE — ED Notes (Addendum)
Rash since December, Has been seeing dermatologist.in . 2-3 mos ago.  Takes coumadin  And is on 02.    Uses a scooter for transportation

## 2014-10-20 NOTE — Discharge Instructions (Signed)
Rash\ Follow up with your doctor and the dermatologist. Return to the ED if you develop chest pain, shortness of breath, or any other concerns. A rash is a change in the color or texture of your skin. There are many different types of rashes. You may have other problems that accompany your rash. CAUSES   Infections.  Allergic reactions. This can include allergies to pets or foods.  Certain medicines.  Exposure to certain chemicals, soaps, or cosmetics.  Heat.  Exposure to poisonous plants.  Tumors, both cancerous and noncancerous. SYMPTOMS   Redness.  Scaly skin.  Itchy skin.  Dry or cracked skin.  Bumps.  Blisters.  Pain. DIAGNOSIS  Your caregiver may do a physical exam to determine what type of rash you have. A skin sample (biopsy) may be taken and examined under a microscope. TREATMENT  Treatment depends on the type of rash you have. Your caregiver may prescribe certain medicines. For serious conditions, you may need to see a skin doctor (dermatologist). HOME CARE INSTRUCTIONS   Avoid the substance that caused your rash.  Do not scratch your rash. This can cause infection.  You may take cool baths to help stop itching.  Only take over-the-counter or prescription medicines as directed by your caregiver.  Keep all follow-up appointments as directed by your caregiver. SEEK IMMEDIATE MEDICAL CARE IF:  You have increasing pain, swelling, or redness.  You have a fever.  You have new or severe symptoms.  You have body aches, diarrhea, or vomiting.  Your rash is not better after 3 days. MAKE SURE YOU:  Understand these instructions.  Will watch your condition.  Will get help right away if you are not doing well or get worse. Document Released: 05/10/2002 Document Revised: 08/12/2011 Document Reviewed: 03/04/2011 [facility] Patient Information 2015 [place], [place]. This information is not intended to replace advice given to you by your health care provider.  Make sure you discuss any questions you have with your health care provider.

## 2014-10-20 NOTE — ED Provider Notes (Signed)
CSN: [id number]     Arrival date & time 10/20/14  1401 History   First MD Initiated Contact with Patient 10/20/14 1556     Chief Complaint  Patient presents with  . Rash     (Consider location/radiation/quality/duration/timing/severity/associated sxs/prior Treatment) HPI Comments: Patient complains of rash to her arms, legs, face that has been ongoing intermittently since November. She reports Lesions that come and go that are irritating and itchy and painful. She denies any trauma. She has seen a dermatologist who told her it was not a dermatological problem. She has not had a skin biopsy. Patient with multiple medical problems including atrial fibrillation on xarelto, CHF, COPD on home oxygen. She denies any fever. She feels her breathing is at baseline. She complains of lesions bleeding due to her xarelto use. The dermatologist she saw was Dr. Sherlene Shams who told her that there is nothing he could do. She is in the process of seeing another dermatologist  The history is provided by the patient.    Past Medical History  Diagnosis Date  . Diabetes mellitus without complication   . Bipolar 1 disorder   . Fibromyalgia   . COPD (chronic obstructive pulmonary disease)   . CHF (congestive heart failure)   . Asthma   . Atrial fibrillation   . Neuropathy     Disc Back   . Fibromyalgia   . CHF (congestive heart failure)   . Coronary artery disease   . Anemia   . History of hiatal hernia   . GERD (gastroesophageal reflux disease)   . Myocardial infarction 1990  . Shortness of breath dyspnea    Past Surgical History  Procedure Laterality Date  . Heel spur surgery    . Appendectomy    . Hernia repair    . Bladder suspension      2003, 2006 and 2010  . Back surgery    . Neck surgery N/A 2009    4, 6, and 7 cervical disc replaced   Family History  Problem Relation Age of Onset  . Heart disease Mother   . COPD Mother   . Cancer Mother     Breast cancer  . Diabetes Mother   .  Heart disease Father   . COPD Sister   . Heart disease Sister   . Diabetes Sister   . Heart disease Maternal Grandmother    History  Substance Use Topics  . Smoking status: Former Smoker -- 2.00 packs/day for 14 years    Types: Cigarettes    Quit date: 04/12/1989  . Smokeless tobacco: Not on file  . Alcohol Use: No   OB History    No data available     Review of Systems  Constitutional: Negative for activity change and appetite change.  HENT: Negative for congestion and rhinorrhea.   Respiratory: Positive for shortness of breath. Negative for cough and chest tightness.   Cardiovascular: Negative for chest pain.  Gastrointestinal: Negative for nausea, vomiting and abdominal pain.  Genitourinary: Negative for vaginal bleeding and vaginal discharge.  Musculoskeletal: Negative for myalgias and arthralgias.  Skin: Positive for rash.  Neurological: Positive for weakness. Negative for dizziness, light-headedness and headaches.  A complete 10 system review of systems was obtained and all systems are negative except as noted in the HPI and PMH.      Allergies  Ancef; Augmentin; Ciprofloxacin; Haldol; Levaquin; Nsaids; Tamiflu; and Zoloft  Home Medications   Prior to Admission medications   Medication Sig Start Date End Date  Taking? Authorizing Provider  albuterol (PROVENTIL) (2.5 MG/3ML) 0.083% nebulizer solution Take 2.5 mg by nebulization every 6 (six) hours as needed for wheezing or shortness of breath.   Yes Historical Provider, MD  atorvastatin (LIPITOR) 20 MG tablet Take 1 tablet (20 mg total) by mouth daily. 06/02/14  Yes Thurnell Lose, MD  cetirizine (ZYRTEC) 10 MG tablet Take 10 mg by mouth daily.   Yes Historical Provider, MD  diltiazem (CARDIZEM) 30 MG tablet Take 1 tablet (30 mg total) by mouth 2 (two) times daily. 10/13/14  Yes Pixie Casino, MD  docusate sodium (COLACE) 100 MG capsule Take 100 mg by mouth daily.   Yes Historical Provider, MD  escitalopram  (LEXAPRO) 10 MG tablet Take 10 mg by mouth at bedtime.    Yes Historical Provider, MD  famotidine (PEPCID) 40 MG tablet Take 20 mg by mouth 2 (two) times daily.    Yes Historical Provider, MD  fenofibrate 54 MG tablet Take 1 tablet (54 mg total) by mouth daily. 09/13/14  Yes Philemon Kingdom, MD  fluticasone (FLONASE) 50 MCG/ACT nasal spray Place 1 spray into both nostrils 2 (two) times daily.   Yes Historical Provider, MD  furosemide (LASIX) 40 MG tablet Take 1.5 tablets (60 mg total) by mouth 2 (two) times daily. 10/13/14  Yes Pixie Casino, MD  gabapentin (NEURONTIN) 300 MG capsule Take 900 mg by mouth 3 (three) times daily.    Yes Historical Provider, MD  Insulin Glargine (TOUJEO SOLOSTAR) 300 UNIT/ML SOPN Inject 60 Units into the skin at bedtime. 09/13/14  Yes Philemon Kingdom, MD  insulin lispro (HUMALOG KWIKPEN) 100 UNIT/ML KiwkPen Inject 0.18-0.24 mLs (18-24 Units total) into the skin 3 (three) times daily. Patient taking differently: Inject 18-22 Units into the skin 3 (three) times daily. 18 UNITS TWICE DAILY AND 22 UNITS IN THE EVENING 09/13/14  Yes Philemon Kingdom, MD  ipratropium (ATROVENT) 0.02 % nebulizer solution Take 0.5 mg by nebulization 2 (two) times daily.   Yes Historical Provider, MD  ipratropium-albuterol (DUONEB) 0.5-2.5 (3) MG/3ML SOLN Take 3 mLs by nebulization 4 (four) times daily. May take an additional 2 treatments if having a bad day DX J96.12 09/21/14  Yes Kathee Delton, MD  metolazone (ZAROXOLYN) 2.5 MG tablet Take 1 tablet (2.5 mg total) by mouth daily. 10/13/14  Yes Pixie Casino, MD  mirtazapine (REMERON) 15 MG tablet Take 15 mg by mouth at bedtime.   Yes Historical Provider, MD  modafinil (PROVIGIL) 200 MG tablet Take 200 mg by mouth daily.   Yes Historical Provider, MD  nystatin (MYCOSTATIN/NYSTOP) 100000 UNIT/GM POWD Apply topically 2 (two) times daily. USES UNDERNEATH BREAST   Yes Historical Provider, MD  ondansetron (ZOFRAN-ODT) 4 MG disintegrating tablet Take 4  mg by mouth every 8 (eight) hours as needed for nausea or vomiting.   Yes Historical Provider, MD  oxyCODONE (OXY IR/ROXICODONE) 5 MG immediate release tablet Take 5 mg by mouth 2 (two) times daily.   Yes Historical Provider, MD  OXYGEN Inhale into the lungs continuous. 3 liters 24/7   Yes Historical Provider, MD  pantoprazole (PROTONIX) 40 MG tablet Take 1 tablet (40 mg total) by mouth daily. 06/02/14  Yes Thurnell Lose, MD  potassium chloride SA (K-DUR,KLOR-CON) 20 MEQ tablet Take 20 mEq by mouth 2 (two) times daily.    Yes Historical Provider, MD  pregabalin (LYRICA) 50 MG capsule Take 50 mg by mouth 3 (three) times daily.   Yes Historical Provider, MD  rivaroxaban Alveda Reasons)  20 MG TABS tablet Take 1 tablet (20 mg total) by mouth daily with supper. 07/18/14  Yes Pixie Casino, MD  doxycycline (VIBRAMYCIN) 100 MG capsule Take 1 capsule (100 mg total) by mouth 2 (two) times daily. 10/20/14   Ezequiel Essex, MD  Insulin Pen Needle (CAREFINE PEN NEEDLES) 31G X 8 MM MISC Use to inject insulin 4 times daily. 09/13/14   Philemon Kingdom, MD  iron polysaccharides (NIFEREX) 150 MG capsule Take 1 capsule (150 mg total) by mouth 2 (two) times daily. Patient not taking: Reported on 10/20/2014 06/02/14   Thurnell Lose, MD   BP 115/93 mmHg  Pulse 78  Temp(Src) 98.2 F (36.8 C) (Oral)  Resp 20  Ht 4\' 11"  (1.499 m)  Wt 206 lb (93.441 kg)  BMI 41.58 kg/m2  SpO2 99% Physical Exam  Constitutional: She is oriented to person, place, and time. She appears well-developed and well-nourished. No distress.  HENT:  Head: Normocephalic and atraumatic.  Mouth/Throat: Oropharynx is clear and moist. No oropharyngeal exudate.  Eyes: Conjunctivae and EOM are normal. Pupils are equal, round, and reactive to light.  Neck: Normal range of motion. Neck supple.  No meningismus.  Cardiovascular: Normal rate, regular rhythm, normal heart sounds and intact distal pulses.   No murmur heard. Pulmonary/Chest: Effort  normal. No respiratory distress. She has wheezes. She exhibits no tenderness.  Diminished breath sounds, no wheezing  Abdominal: Soft. There is no tenderness. There is no rebound and no guarding.  Musculoskeletal: Normal range of motion. She exhibits edema. She exhibits no tenderness.  Neurological: She is alert and oriented to person, place, and time. No cranial nerve deficit. She exhibits normal muscle tone. Coordination normal.  No ataxia on finger to nose bilaterally. No pronator drift. 5/5 strength throughout. CN 2-12 intact. Negative Romberg. Equal grip strength. Sensation intact. Gait is normal.   Skin: Skin is warm. Rash noted.  Patient has a excoriated lesion to her right chin that is oozing blood. No cellulitis or abscess  She has multiple other lesions with shallow ulceration and excoriation to her arms and legs in various stages of healing. No surrounding cellulitis or abscess.   Psychiatric: She has a normal mood and affect. Her behavior is normal.  Nursing note and vitals reviewed.   ED Course  Procedures (including critical care time) Labs Review Labs Reviewed  CBC WITH DIFFERENTIAL/PLATELET - Abnormal; Notable for the following:    WBC 12.3 (*)    Hemoglobin 10.2 (*)    HCT 35.5 (*)    MCH 23.9 (*)    MCHC 28.7 (*)    Neutrophils Relative % 79 (*)    Neutro Abs 9.6 (*)    All other components within normal limits  COMPREHENSIVE METABOLIC PANEL - Abnormal; Notable for the following:    Potassium 3.4 (*)    Chloride 88 (*)    CO2 41 (*)    Glucose, Bld 163 (*)    BUN 25 (*)    Calcium 8.5 (*)    Total Protein 8.2 (*)    ALT 9 (*)    All other components within normal limits  PROTIME-INR - Abnormal; Notable for the following:    Prothrombin Time 20.5 (*)    INR 1.76 (*)    All other components within normal limits  BRAIN NATRIURETIC PEPTIDE - Abnormal; Notable for the following:    B Natriuretic Peptide 151.0 (*)    All other components within normal limits   TROPONIN I  Imaging Review Dg Chest 2 View  10/20/2014   CLINICAL DATA:  Rash to the face and ear for 3 months. Chronic shortness of breath.  EXAM: CHEST  2 VIEW  COMPARISON:  08/09/2014  FINDINGS: Improved aeration at the right lung base compared to the prior examination. Stable enlargement of the cardiac silhouette. Postsurgical changes in the lower cervical spine. No focal airspace disease or pulmonary edema. No large pleural effusion.  IMPRESSION: No acute chest findings.  Stable cardiomegaly.  Improved aeration at the right lung base compared to prior exam.   Electronically Signed   By: Markus Daft M.D.   On: 10/20/2014 17:32     EKG Interpretation   Date/Time:  Thursday Oct 20 2014 16:37:58 EDT Ventricular Rate:  88 PR Interval:    QRS Duration: 99 QT Interval:  409 QTC Calculation: 495 R Axis:   -91 Text Interpretation:  Atrial fibrillation Ventricular premature complex  Left anterior fascicular block Anterior infarct, old Nonspecific T  abnormalities, lateral leads No significant change was found Confirmed by  Wyvonnia Dusky  MD, Jordanna Hendrie [phone]) on 10/20/2014 4:59:31 PM      MDM   Final diagnoses:  Visit for wound check   Patient with recurrent rash for   6 months. rash comes and goes in various stages of healing. No fever. No difficulty breathing worse than baseline.   There is a wound to patient's chin that is oozing blood. No evidence of cellulitis or abscess.  Hemoglobin has improved from previous. Renal function is stable. Chest x-ray shows improved aeration without frank edema. She feels her breathing is at baseline and she is satting 99% on her home 3 L. she is taking her diuretics as prescribed. She does not appear to have a CHF or COPD exacerbation at this time. She denies any chest pain or shortness of breath.  She appears stable to follow up as an outpatient. She'll given referral dermatology regarding her ongoing skin wounds. Start empiric doxycycline.  Return  precautions discussed.  Ezequiel Essex, MD 10/20/14 2140

## 2014-10-24 ENCOUNTER — Ambulatory Visit (INDEPENDENT_AMBULATORY_CARE_PROVIDER_SITE_OTHER): Payer: Medicare Other | Admitting: Pulmonary Disease

## 2014-10-24 DIAGNOSIS — R0600 Dyspnea, unspecified: Secondary | ICD-10-CM

## 2014-10-24 LAB — PULMONARY FUNCTION TEST
DL/VA % PRED: 90 %
DL/VA: 3.89 ml/min/mmHg/L
DLCO UNC: 10.74 ml/min/mmHg
DLCO unc % pred: 54 %
FEF 25-75 PRE: 0.2 L/s
FEF 25-75 Post: 0.45 L/sec
FEF2575-%Change-Post: 129 %
FEF2575-%PRED-POST: 22 %
FEF2575-%PRED-PRE: 9 %
FEV1-%CHANGE-POST: 36 %
FEV1-%PRED-POST: 30 %
FEV1-%Pred-Pre: 22 %
FEV1-Post: 0.64 L
FEV1-Pre: 0.47 L
FEV1FVC-%Change-Post: 19 %
FEV1FVC-%PRED-PRE: 70 %
FEV6-%Change-Post: 13 %
FEV6-%Pred-Post: 36 %
FEV6-%Pred-Pre: 32 %
FEV6-Post: 0.98 L
FEV6-Pre: 0.86 L
FEV6FVC-%Change-Post: 0 %
FEV6FVC-%Pred-Post: 102 %
FEV6FVC-%Pred-Pre: 102 %
FVC-%CHANGE-POST: 14 %
FVC-%PRED-PRE: 31 %
FVC-%Pred-Post: 35 %
FVC-Post: 1 L
FVC-Pre: 0.87 L
Post FEV1/FVC ratio: 65 %
Post FEV6/FVC ratio: 98 %
Pre FEV1/FVC ratio: 54 %
Pre FEV6/FVC Ratio: 99 %

## 2014-10-24 NOTE — Progress Notes (Signed)
PFT done today. 

## 2014-10-25 ENCOUNTER — Ambulatory Visit (INDEPENDENT_AMBULATORY_CARE_PROVIDER_SITE_OTHER): Payer: Medicare Other | Admitting: Internal Medicine

## 2014-10-25 ENCOUNTER — Encounter: Payer: Self-pay | Admitting: Internal Medicine

## 2014-10-25 VITALS — BP 124/68 | HR 93 | Temp 98.5°F | Resp 14 | Wt 227.0 lb

## 2014-10-25 DIAGNOSIS — E1329 Other specified diabetes mellitus with other diabetic kidney complication: Secondary | ICD-10-CM

## 2014-10-25 DIAGNOSIS — E1322 Other specified diabetes mellitus with diabetic chronic kidney disease: Secondary | ICD-10-CM

## 2014-10-25 DIAGNOSIS — IMO0002 Reserved for concepts with insufficient information to code with codable children: Secondary | ICD-10-CM

## 2014-10-25 DIAGNOSIS — N183 Chronic kidney disease, stage 3 unspecified: Secondary | ICD-10-CM

## 2014-10-25 DIAGNOSIS — E1365 Other specified diabetes mellitus with hyperglycemia: Principal | ICD-10-CM

## 2014-10-25 MED ORDER — INSULIN PEN NEEDLE 32G X 4 MM MISC
Status: DC
Start: 2014-10-25 — End: 2015-05-01

## 2014-10-25 NOTE — Patient Instructions (Addendum)
Please continue: - Toujeo 75 units at bedtime Increase: - Humalog to: 20 units before B 22 units before L  28 units before D - sliding scale of Humalog - 150-175: + 1 unit  - 176-200: + 2 units  - 201-225: + 3 units  - 226-250: + 4 units  - 251-275: + 5 units - >275: + 6 units  Please return in 3 month with your sugar log.

## 2014-10-25 NOTE — Progress Notes (Signed)
Patient ID: Denise Freeman, female   DOB: March 03, 1951, 64 y.o.   MRN: [id number]  HPI: Denise Freeman is a 64 y.o.-year-old female, initially referred by her PCP, Dr. Loman Chroman, for management of DM2, dx in 2009, insulin-dependent in 2013, uncontrolled, with complications (CKD, DR). Last visit 1.5 mo ago.  She had a skin Bx on her R jaw. On Doxycycline.  Last hemoglobin A1c was: Lab Results  Component Value Date   HGBA1C 8.3* 09/13/2014   HGBA1C 7.6* 05/13/2014   12/30/2013: HbA1c 8.6% 12/28/2013: HbA1c 7.9% HbA1c 8.7% HbA1c 9.4% HbA1c 7.3%  Pt is on a regimen of: - Toujeo 65 >> 75 units at bedtime. - Humalog to: 18 units before B 22 units before L  24 units before D - sliding scale of Humalog - 150-175: + 1 unit  - 176-200: + 2 units  - 201-225: + 3 units  - 226-250: + 4 units  - 251-275: + 5 units - >275: + 6 units We stopped Januvia.  Pt checks her sugars 4x a day and they are better (no log): - am: 149-170 >> 158-191 >> 160-175, 180 >> 173-189 >> 160s - 2h after b'fast: n/c - before lunch: 158-199 >> 170-199 >> 130-150 >> 167-191 >> 195-210 (after a snack) - 2h after lunch: n/c - before dinner: 169-205, 225 >> 168-200 >> 175-190  >> 179-200 >> 140s (if no snack), 165-170 (after snack) - 2h after dinner: n/c - bedtime: 154-199 >> 162-200, last week 210-235 >> 165-190 >> 169-238 >> 225-250 - nighttime:n/c No lows. Lowest sugar was 130 >> 167 >> 140s; she has hypoglycemia awareness at 70 at 120. Highest sugar was 240 >> 190 >> 200s   Glucometer: FreeStyle  Pt's meals are: - Breakfast: toast with spread or oatmeal with splenda - Lunch: cottage cheese, or fruit - Dinner: chicken/beef/turkey with vegetables and a starch; macaroni and cheese - Snacks: 2 snacks   She tells me she eats between meals - almost incessantly.  - + CKD stage 3, last BUN/creatinine:  Lab Results  Component Value Date   BUN 25* 10/20/2014   CREATININE 0.91 10/20/2014  On Lisinopril,  fenofibrate. - last set of lipids: 09/07/2013: 180/135/31/138 On Crestor. - last eye exam was in 2014. 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 She has another one scheduled in 10/2014. - no numbness and tingling in her feet.  ROS: Constitutional: + weight loss, + fatigue, + poor sleep, + nocturia Eyes: + blurry vision, no xerophthalmia ENT: no sore throat, no nodules palpated in throat, no dysphagia/odynophagia, no hoarseness, + hypoacusis, + tinnitus Cardiovascular: + CP/+ SOB/+ palpitations/+ leg swelling Respiratory: no cough/+ SOB/+ wheezing Gastrointestinal: + N/no V/+ D/no C, + heartburn Musculoskeletal: + muscle aches/+ joint aches Skin: no rashes, + easy bruising, + itching Neurological: no tremors/numbness/tingling/dizziness, + HA  I reviewed pt's medications, allergies, PMH, social hx, family hx, and changes were documented in the history of present illness. Otherwise, unchanged from my initial visit note:  Past Medical History  Diagnosis Date  . Diabetes mellitus without complication   . Bipolar 1 disorder   . Fibromyalgia   . COPD (chronic obstructive pulmonary disease)   . CHF (congestive heart failure)   . Asthma   . Atrial fibrillation   . Neuropathy     Disc Back   . Fibromyalgia   . CHF (congestive heart failure)   . Coronary artery disease   . Anemia   . History of hiatal hernia   . GERD (gastroesophageal reflux disease)   .  Myocardial infarction 1990  . Shortness of breath dyspnea    Past Surgical History  Procedure Laterality Date  . Heel spur surgery    . Appendectomy    . Hernia repair    . Bladder suspension      2003, 2006 and 2010  . Back surgery    . Neck surgery N/A 2009    4, 6, and 7 cervical disc replaced   History   Social History  . Marital Status: Married    Spouse Name: N/A    Number of Children: 1   Occupational History  . disabled   Social History Main Topics  . Smoking status: Former Smoker -- 2.00 packs/day for 14 years    Types: Cigarettes     Quit date: 04/12/1986  . Smokeless tobacco: Not on file  . Alcohol Use: No  . Drug Use: No   Current Outpatient Prescriptions on File Prior to Visit  Medication Sig Dispense Refill  . albuterol (PROVENTIL) (2.5 MG/3ML) 0.083% nebulizer solution Take 2.5 mg by nebulization every 6 (six) hours as needed for wheezing or shortness of breath.    Marland Kitchen atorvastatin (LIPITOR) 20 MG tablet Take 1 tablet (20 mg total) by mouth daily. 30 tablet 1  . cetirizine (ZYRTEC) 10 MG tablet Take 10 mg by mouth daily.    Marland Kitchen diltiazem (CARDIZEM) 30 MG tablet Take 1 tablet (30 mg total) by mouth 2 (two) times daily. 180 tablet 1  . docusate sodium (COLACE) 100 MG capsule Take 100 mg by mouth daily.    Marland Kitchen doxycycline (VIBRAMYCIN) 100 MG capsule Take 1 capsule (100 mg total) by mouth 2 (two) times daily. 20 capsule 0  . escitalopram (LEXAPRO) 10 MG tablet Take 10 mg by mouth at bedtime.     . famotidine (PEPCID) 40 MG tablet Take 20 mg by mouth 2 (two) times daily.     . fenofibrate 54 MG tablet Take 1 tablet (54 mg total) by mouth daily. 90 tablet 1  . fluticasone (FLONASE) 50 MCG/ACT nasal spray Place 1 spray into both nostrils 2 (two) times daily.    . furosemide (LASIX) 40 MG tablet Take 1.5 tablets (60 mg total) by mouth 2 (two) times daily. 270 tablet 1  . gabapentin (NEURONTIN) 300 MG capsule Take 900 mg by mouth 3 (three) times daily.     . Insulin Glargine (TOUJEO SOLOSTAR) 300 UNIT/ML SOPN Inject 60 Units into the skin at bedtime. 18 pen 1  . insulin lispro (HUMALOG KWIKPEN) 100 UNIT/ML KiwkPen Inject 0.18-0.24 mLs (18-24 Units total) into the skin 3 (three) times daily. (Patient taking differently: Inject 18-22 Units into the skin 3 (three) times daily. 18 UNITS TWICE DAILY AND 22 UNITS IN THE EVENING) 20 pen 1  . Insulin Pen Needle (CAREFINE PEN NEEDLES) 31G X 8 MM MISC Use to inject insulin 4 times daily. 360 each 3  . ipratropium (ATROVENT) 0.02 % nebulizer solution Take 0.5 mg by nebulization 2 (two) times  daily.    Marland Kitchen ipratropium-albuterol (DUONEB) 0.5-2.5 (3) MG/3ML SOLN Take 3 mLs by nebulization 4 (four) times daily. May take an additional 2 treatments if having a bad day DX J96.12 360 mL 6  . iron polysaccharides (NIFEREX) 150 MG capsule Take 1 capsule (150 mg total) by mouth 2 (two) times daily. 60 capsule 0  . metolazone (ZAROXOLYN) 2.5 MG tablet Take 1 tablet (2.5 mg total) by mouth daily. 90 tablet 1  . mirtazapine (REMERON) 15 MG tablet Take 15 mg by  mouth at bedtime.    . modafinil (PROVIGIL) 200 MG tablet Take 200 mg by mouth daily.    Marland Kitchen nystatin (MYCOSTATIN/NYSTOP) 100000 UNIT/GM POWD Apply topically 2 (two) times daily. USES UNDERNEATH BREAST    . ondansetron (ZOFRAN-ODT) 4 MG disintegrating tablet Take 4 mg by mouth every 8 (eight) hours as needed for nausea or vomiting.    Marland Kitchen oxyCODONE (OXY IR/ROXICODONE) 5 MG immediate release tablet Take 5 mg by mouth 2 (two) times daily.    . OXYGEN Inhale into the lungs continuous. 3 liters 24/7    . pantoprazole (PROTONIX) 40 MG tablet Take 1 tablet (40 mg total) by mouth daily. 30 tablet 0  . potassium chloride SA (K-DUR,KLOR-CON) 20 MEQ tablet Take 20 mEq by mouth 2 (two) times daily.     . pregabalin (LYRICA) 50 MG capsule Take 50 mg by mouth 3 (three) times daily.    . rivaroxaban (XARELTO) 20 MG TABS tablet Take 1 tablet (20 mg total) by mouth daily with supper. 90 tablet 1   No current facility-administered medications on file prior to visit.   Allergies  Allergen Reactions  . Ancef [Cefazolin] Nausea And Vomiting  . Augmentin [Amoxicillin-Pot Clavulanate] Nausea Only  . Ciprofloxacin Nausea And Vomiting  . Haldol [Haloperidol] Other (See Comments)    Restless leg  . Levaquin [Levofloxacin In D5w] Other (See Comments)    "afib"  . Nsaids Diarrhea  . Tamiflu [Oseltamivir Phosphate] Other (See Comments)    "water blisters"  . Zoloft [Sertraline Hcl] Other (See Comments)    Jaw problems   Family History  Problem Relation Age of  Onset  . Heart disease Mother   . COPD Mother   . Cancer Mother     Breast cancer  . Diabetes Mother   . Heart disease Father   . COPD Sister   . Heart disease Sister   . Diabetes Sister   . Heart disease Maternal Grandmother    PE: BP 124/68 mmHg  Pulse 93  Temp(Src) 98.5 F (36.9 C) (Oral)  Resp 14  Wt 227 lb (102.967 kg)  SpO2 96% There is no weight on file to calculate BMI. Wt Readings from Last 3 Encounters:  10/25/14 227 lb (102.967 kg)  10/20/14 206 lb (93.441 kg)  10/13/14 226 lb 11.2 oz (102.83 kg)   Constitutional: obese, in wheelchair, in NAD, on oxygen Eyes: PERRLA, EOMI, no exophthalmos ENT: moist mucous membranes, no thyromegaly, no cervical lymphadenopathy Cardiovascular: irreg. Irreg. , No MRG Respiratory: CTA B Gastrointestinal: abdomen soft, NT, ND, BS+ Musculoskeletal: no deformities, strength intact in all 4 Skin: moist, warm Neurological: no tremor with outstretched hands, DTR normal in all 4  ASSESSMENT: 1. DM2, insulin-dependent, uncontrolled, with complications - CKD stage 3 - DR  PLAN:  1. Patient with long-standing, uncontrolled diabetes (but last HbA1c not far from goal), on basal-bolus insulin regimen - with still high sugars throughout the day, but she tells me she eats between meals - almost incessantly as she is afraid of sugars dropping - however, if no snack between meals >> sugars 140s. Advised to not snack between meals and if she wants to have fruit, this is OK, but move it at the end of a meal. We will increase the B and D insulin  - sugars at bedtime are quite high. Reviewed last Hba1c >> a little higher. - I advised her to:  Patient Instructions  Please continue: - Toujeo 75 units at bedtime Increase: - Humalog to: 20 units  before B 22 units before L  28 units before D - sliding scale of Humalog - 150-175: + 1 unit  - 176-200: + 2 units  - 201-225: + 3 units  - 226-250: + 4 units  - 251-275: + 5 units - >275: + 6  units  Please return in 3 month with your sugar log.   - Strongly advised her to continue checking sugars at different times of the day - check 4 times a day, rotating checks - advised for yearly eye exams >> has one scheduled; will need cataract sx - Return to clinic in 3 mo with sugar log

## 2014-11-01 DIAGNOSIS — L98499 Non-pressure chronic ulcer of skin of other sites with unspecified severity: Secondary | ICD-10-CM | POA: Diagnosis not present

## 2014-11-01 DIAGNOSIS — D485 Neoplasm of uncertain behavior of skin: Secondary | ICD-10-CM | POA: Diagnosis not present

## 2014-11-01 DIAGNOSIS — L309 Dermatitis, unspecified: Secondary | ICD-10-CM | POA: Diagnosis not present

## 2014-11-01 DIAGNOSIS — L280 Lichen simplex chronicus: Secondary | ICD-10-CM | POA: Diagnosis not present

## 2014-11-02 DIAGNOSIS — F319 Bipolar disorder, unspecified: Secondary | ICD-10-CM | POA: Diagnosis not present

## 2014-11-03 ENCOUNTER — Telehealth: Payer: Self-pay | Admitting: Pulmonary Disease

## 2014-11-03 NOTE — Telephone Encounter (Signed)
Pt had PFT done 10/24/14. Requesting results. Please advise Denise Freeman

## 2014-11-04 MED ORDER — BUDESONIDE 0.5 MG/2ML IN SUSP: 0.5000 mg | Freq: Two times a day (BID) | RESPIRATORY_TRACT | Status: DC

## 2014-11-04 NOTE — Telephone Encounter (Signed)
[place] - please advise of PFT results.  Thanks.

## 2014-11-04 NOTE — Telephone Encounter (Signed)
Let her know that her breathing studies show that she has moderate to severe emphysema.  I would like to add a medication called budesonide to her nebulizer machine with the duoneb twice a day only (continue the duoneb 4 times a day)

## 2014-11-04 NOTE — Telephone Encounter (Signed)
I spoke with patient about results and she verbalized understanding and had no questions. rx sent in. Nothing further needed

## 2014-11-07 DIAGNOSIS — R531 Weakness: Secondary | ICD-10-CM | POA: Diagnosis not present

## 2014-11-07 DIAGNOSIS — J449 Chronic obstructive pulmonary disease, unspecified: Secondary | ICD-10-CM | POA: Diagnosis not present

## 2014-11-07 DIAGNOSIS — I509 Heart failure, unspecified: Secondary | ICD-10-CM | POA: Diagnosis not present

## 2014-11-08 ENCOUNTER — Ambulatory Visit (INDEPENDENT_AMBULATORY_CARE_PROVIDER_SITE_OTHER): Payer: Medicare Other | Admitting: Neurology

## 2014-11-08 ENCOUNTER — Encounter: Payer: Self-pay | Admitting: Neurology

## 2014-11-08 VITALS — BP 150/72 | HR 80

## 2014-11-08 DIAGNOSIS — G253 Myoclonus: Secondary | ICD-10-CM

## 2014-11-08 DIAGNOSIS — G251 Drug-induced tremor: Secondary | ICD-10-CM | POA: Diagnosis not present

## 2014-11-08 NOTE — Progress Notes (Signed)
Denise Freeman was seen today in the movement disorders clinic for neurologic consultation at the request of MORROW, Marjory Lies, MD.  The consultation is for the evaluation of tremor.  The records that were made available to me were reviewed.  Pt does have a hx of bipolar d/o but is not on medication specifically for this.  Med hx review does not reveal any exposure to antipsychotics.    Pt reports that tremor 3 years ago, but has gotten worse.  It started in both hands and has continued in both hands.  No leg tremor.  She is on albuterol/ipratropium nebulizer.  She uses the albuterol nebulizer form 4 times per day.  She estimates that she has been on that for a year.  She doesn't think that the nebulizer affects her tremor.  There is a fam hx of tremor in her mother.  Tremor: Yes.     Affected by caffeine:  Doesn't drink caffeine at all  Affected by alcohol:  Doesn't drink alcohol  Affected by stress:  Yes.    Affected by fatigue:  No.  Spills soup if on spoon:  Yes.    Spills glass of liquid if full:  Yes.    Affects ADL's (tying shoes, brushing teeth, etc):  Yes.    Other Specific Symptoms:  Voice: pt states that she has gotten louder Sleep: sleeps with bipap  Vivid Dreams:  No.  Acting out dreams:  No. Wet Pillows: No. Postural symptoms:  Yes.  , feels "wobbly"  Falls?  Yes.   (last fall about 6 weeks ago; was in bathroom at [employer] and toilet seat was loose and she tried to get up and fell; otherwise had not fallen since last year) Bradykinesia symptoms: difficulty getting out of a chair and difficulty regaining balance; admits that she cannot walk well but because of her breathing her SOB Loss of smell:  No. Loss of taste:  No. Urinary Incontinence:  No. (doing better with bladder stim - used to have it) Difficulty Swallowing:  Only occasionally and with large potassium pill Handwriting, micrographia: not necessarily micrographia but she has trouble with handwriting because of  tremor Depression:  Yes.   Memory changes:  Yes.   (states that she had accidental overdose while in the hospital - states hospital error - few years ago and states that memory not same since; doesn't drive but never has) Hallucinations:  No.  visual distortions: No. N/V:  No. Lightheaded:  Yes.    Syncope: No. Diplopia:  No. Dyskinesia:  No.    ALLERGIES:   Allergies  Allergen Reactions  . Ancef [Cefazolin] Nausea And Vomiting  . Augmentin [Amoxicillin-Pot Clavulanate] Nausea Only  . Ciprofloxacin Nausea And Vomiting  . Haldol [Haloperidol] Other (See Comments)    Restless leg  . Levaquin [Levofloxacin In D5w] Other (See Comments)    "afib"  . Nsaids Diarrhea  . Tamiflu [Oseltamivir Phosphate] Other (See Comments)    "water blisters"  . Zoloft [Sertraline Hcl] Other (See Comments)    Jaw problems    CURRENT MEDICATIONS:  Outpatient Encounter Prescriptions as of 11/08/2014  Medication Sig  . albuterol (PROVENTIL) (2.5 MG/3ML) 0.083% nebulizer solution Take 2.5 mg by nebulization every 6 (six) hours as needed for wheezing or shortness of breath.  Marland Kitchen atorvastatin (LIPITOR) 20 MG tablet Take 1 tablet (20 mg total) by mouth daily.  . budesonide (PULMICORT) 0.5 MG/2ML nebulizer solution Take 2 mLs (0.5 mg total) by nebulization 2 (two) times daily. DX J43.8  .  cetirizine (ZYRTEC) 10 MG tablet Take 10 mg by mouth daily.  Marland Kitchen diltiazem (CARDIZEM) 30 MG tablet Take 1 tablet (30 mg total) by mouth 2 (two) times daily.  Marland Kitchen docusate sodium (COLACE) 100 MG capsule Take 100 mg by mouth daily.  Marland Kitchen escitalopram (LEXAPRO) 10 MG tablet Take 10 mg by mouth at bedtime.   . famotidine (PEPCID) 40 MG tablet Take 20 mg by mouth 2 (two) times daily.   . fenofibrate 54 MG tablet Take 1 tablet (54 mg total) by mouth daily.  . fluticasone (FLONASE) 50 MCG/ACT nasal spray Place 1 spray into both nostrils 2 (two) times daily.  . furosemide (LASIX) 40 MG tablet Take 1.5 tablets (60 mg total) by mouth 2 (two)  times daily.  Marland Kitchen gabapentin (NEURONTIN) 300 MG capsule Take 900 mg by mouth 3 (three) times daily.   . Insulin Glargine (TOUJEO SOLOSTAR) 300 UNIT/ML SOPN Inject 60 Units into the skin at bedtime.  . insulin lispro (HUMALOG KWIKPEN) 100 UNIT/ML KiwkPen Inject 0.18-0.24 mLs (18-24 Units total) into the skin 3 (three) times daily. (Patient taking differently: Inject 18-22 Units into the skin 3 (three) times daily. 18 UNITS TWICE DAILY AND 22 UNITS IN THE EVENING)  . Insulin Pen Needle (CAREFINE PEN NEEDLES) 32G X 4 MM MISC Use 4x a day  . ipratropium (ATROVENT) 0.02 % nebulizer solution Take 0.5 mg by nebulization 2 (two) times daily.  Marland Kitchen ipratropium-albuterol (DUONEB) 0.5-2.5 (3) MG/3ML SOLN Take 3 mLs by nebulization 4 (four) times daily. May take an additional 2 treatments if having a bad day DX J96.12  . metolazone (ZAROXOLYN) 2.5 MG tablet Take 1 tablet (2.5 mg total) by mouth daily.  . mirtazapine (REMERON) 15 MG tablet Take 15 mg by mouth at bedtime.  . modafinil (PROVIGIL) 200 MG tablet Take 200 mg by mouth daily.  Marland Kitchen nystatin (MYCOSTATIN/NYSTOP) 100000 UNIT/GM POWD Apply topically 2 (two) times daily. USES UNDERNEATH BREAST  . ondansetron (ZOFRAN-ODT) 4 MG disintegrating tablet Take 4 mg by mouth every 8 (eight) hours as needed for nausea or vomiting.  Marland Kitchen oxyCODONE (OXY IR/ROXICODONE) 5 MG immediate release tablet Take 5 mg by mouth 2 (two) times daily.  . OXYGEN Inhale into the lungs continuous. 3 liters 24/7  . pantoprazole (PROTONIX) 40 MG tablet Take 1 tablet (40 mg total) by mouth daily.  . potassium chloride SA (K-DUR,KLOR-CON) 20 MEQ tablet Take 20 mEq by mouth 2 (two) times daily.   . pregabalin (LYRICA) 50 MG capsule Take 50 mg by mouth 3 (three) times daily.  . rivaroxaban (XARELTO) 20 MG TABS tablet Take 1 tablet (20 mg total) by mouth daily with supper.  . doxycycline (VIBRAMYCIN) 100 MG capsule Take 1 capsule (100 mg total) by mouth 2 (two) times daily. (Patient not taking: Reported  on 11/08/2014)  . iron polysaccharides (NIFEREX) 150 MG capsule Take 1 capsule (150 mg total) by mouth 2 (two) times daily. (Patient not taking: Reported on 11/08/2014)   No facility-administered encounter medications on file as of 11/08/2014.    PAST MEDICAL HISTORY:   Past Medical History  Diagnosis Date  . Diabetes mellitus without complication   . Bipolar 1 disorder   . Fibromyalgia   . COPD (chronic obstructive pulmonary disease)   . CHF (congestive heart failure)   . Asthma   . Atrial fibrillation   . Neuropathy     Disc Back   . Fibromyalgia   . CHF (congestive heart failure)   . Coronary artery disease   .  Anemia   . History of hiatal hernia   . GERD (gastroesophageal reflux disease)   . Myocardial infarction 1990  . Shortness of breath dyspnea     PAST SURGICAL HISTORY:   Past Surgical History  Procedure Laterality Date  . Heel spur surgery    . Appendectomy    . Hernia repair    . Bladder suspension      2003, 2006 and 2010  . Neck surgery N/A 2009    4, 6, and 7 cervical disc replaced    SOCIAL HISTORY:   History   Social History  . Marital Status: Married    Spouse Name: N/A  . Number of Children: N/A  . Years of Education: N/A   Occupational History  . house wife    Social History Main Topics  . Smoking status: Former Smoker -- 2.00 packs/day for 14 years    Types: Cigarettes    Quit date: 04/12/1989  . Smokeless tobacco: Not on file  . Alcohol Use: No  . Drug Use: No  . Sexual Activity: No   Other Topics Concern  . Not on file   Social History Narrative   Married   Disabled   1 child; 94 yrs   6 pregnancies   1 child       FAMILY HISTORY:   Family Status  Relation Status Death Age  . Mother Deceased 41    heart disease, COPD, breast cancer  . Father Deceased 73    heart disease  . Sister Deceased     34  . Maternal Grandmother Deceased 56  . Maternal Grandfather Deceased 46  . Paternal Grandmother Deceased   . Paternal  Grandfather Deceased     ROS:  A complete 10 system review of systems was obtained and was unremarkable apart from what is mentioned above.  PHYSICAL EXAMINATION:    VITALS:   Filed Vitals:   11/08/14 1355  BP: 150/72  Pulse: 80    GEN:  The patient appears stated age and is in NAD. HEENT:  Normocephalic, atraumatic.  The mucous membranes are moist. The superficial temporal arteries are without ropiness or tenderness. CV:  RRR but there is decreased air movement throughout. Lungs:  CTAB.  She is wearing O2.  She does have dyspnea when asked to walk down the hall with a walker. Neck/HEME:  There are no carotid bruits bilaterally.  Neurological examination:  Orientation: The patient is alert and oriented x3. Fund of knowledge is appropriate.  Recent and remote memory are intact.  Attention and concentration are normal.    Able to name objects and repeat phrases. Cranial nerves: There is good facial symmetry. Pupils are equal round and reactive to light bilaterally. Fundoscopic exam reveals clear margins bilaterally. Extraocular muscles are intact. The visual fields are full to confrontational testing. The speech is fluent and clear.  She does have mild vocal tremor.   Soft palate rises symmetrically and there is no tongue deviation. Hearing is intact to conversational tone. Sensation: Sensation is intact to light and pinprick throughout (facial, trunk, extremities). Vibration is markedly decreased at the bilateral big toe. There is no consistent extinction with double simultaneous stimulation (reports that I am touching the right leg when I am touching the left and vice versa and is pointing to the leg, so no reason to think that she was just confusing right/left). There is no sensory dermatomal level identified. Motor: Strength is 5/5 in the bilateral upper and lower extremities.  Shoulder shrug is equal and symmetric.  There is no pronator drift. Deep tendon reflexes: Deep tendon reflexes  are 2/4 at the bilateral biceps, triceps, brachioradialis, 2- at the left patella, absent at the right patella and absent at the bilateral Achilles.  Plantar responses are downgoing bilaterally.    Movement examination: Tone: There is tone in the bilateral upper extremities.  The tone in the lower extremities is normal.  Abnormal movements: There is tremor in both hands that increases with intention but is overall mild.  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  No asterixis.  Mild trouble with Archimedes spirals. Coordination:  There is no decremation with RAM's, with any form or rapid alternating movements, including alternating supination and pronation of the forearm, hand opening and closing, finger taps, heel taps and toe taps. Gait and Station: The patient has difficulty arising out of a deep-seated chair without the use of the hands.  She was given a walker and required some assistance to get up.  She is slow, but she does not shuffle.  She is short of breath when she walks.    LABS  No results found for: TSH    Chemistry      Component Value Date/Time   NA 139 10/20/2014 1634   K 3.4* 10/20/2014 1634   CL 88* 10/20/2014 1634   CO2 41* 10/20/2014 1634   BUN 25* 10/20/2014 1634   CREATININE 0.91 10/20/2014 1634      Component Value Date/Time   CALCIUM 8.5* 10/20/2014 1634   ALKPHOS 96 10/20/2014 1634   AST 15 10/20/2014 1634   ALT 9* 10/20/2014 1634   BILITOT 0.5 10/20/2014 1634     Lab Results  Component Value Date   WBC 12.3* 10/20/2014   HGB 10.2* 10/20/2014   HCT 35.5* 10/20/2014   MCV 83.3 10/20/2014   PLT 324 10/20/2014     ASSESSMENT/PLAN:  1.  Tremor  -suspect that she may have essential tremor (fam hx of tremor) that has been made worse by qid nebulizer usage with ipratropium/albuterol.  She and I discussed this today.  I see no evidence of a neurodegenerative d/o such as PD.  I would not add further medications for this and she agreed.  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  This is really her main complaint.  She is throwing food or sometimes will throw a plate or fork.  Takes gabapentin - 900 mg tid.  Takes lyrica 75 mg bid.  Estimates that has been on both for 3 years and this is about how long the myoclonus has been going on.  I found gabapentin to be a very large offender for causing myoclonus.  In addition, I discussed with her that both gabapentin and Lyrica are very similar drugs and I do not recommend using them together.  She really did not want to stop, alter or discontinue the medications as they are helping her back pain.  I told her that I was not going to do this anyway, as I'm not prescribing physician of these medications, but I did recommend that she make a follow-up appointment with her primary care physician to discuss these issues.  She was agreeable and we assisted her in getting that follow-up appointment.  -if not already done so, would recommend her TSH be checked but   3.  F/u prn.  Much greater than 50% of this visit was spent in counseling and coordinating care with the patient.  Total face to face time:  22  min (not including the time for record review)

## 2014-11-08 NOTE — Progress Notes (Signed)
Note routed to Dr Orland Mustard.

## 2014-11-08 NOTE — Patient Instructions (Signed)
Appt with Dr Orland Mustard scheduled 11/09/2014 at 3:00 pm.

## 2014-11-09 DIAGNOSIS — D539 Nutritional anemia, unspecified: Secondary | ICD-10-CM | POA: Diagnosis not present

## 2014-11-09 DIAGNOSIS — G8929 Other chronic pain: Secondary | ICD-10-CM | POA: Diagnosis not present

## 2014-11-09 DIAGNOSIS — D649 Anemia, unspecified: Secondary | ICD-10-CM | POA: Diagnosis not present

## 2014-11-09 DIAGNOSIS — G629 Polyneuropathy, unspecified: Secondary | ICD-10-CM | POA: Diagnosis not present

## 2014-11-14 DIAGNOSIS — Z79891 Long term (current) use of opiate analgesic: Secondary | ICD-10-CM | POA: Diagnosis not present

## 2014-11-14 DIAGNOSIS — M545 Low back pain: Secondary | ICD-10-CM | POA: Diagnosis not present

## 2014-11-14 DIAGNOSIS — M25562 Pain in left knee: Secondary | ICD-10-CM | POA: Diagnosis not present

## 2014-11-14 DIAGNOSIS — M25561 Pain in right knee: Secondary | ICD-10-CM | POA: Diagnosis not present

## 2014-11-14 DIAGNOSIS — G8929 Other chronic pain: Secondary | ICD-10-CM | POA: Diagnosis not present

## 2014-11-18 ENCOUNTER — Ambulatory Visit: Payer: Medicare Other | Admitting: Urology

## 2014-11-23 NOTE — Patient Instructions (Signed)
Your procedure is scheduled on: 11/29/2014  Report to [facility] at  700  AM.  Call this number if you have problems the morning of surgery: [phone]   Do not eat food or drink liquids :After Midnight.      Take these medicines the morning of surgery with A SIP OF WATER: zyrtec, cardizem, lexapro, pepcid, gabapentin, metalazone, provigil, zofran, oxycodone, protonix, lyrica. Take your nebulizer before you come. DO NOT take anything for your diabetes the morning of surgery.   Do not wear jewelry, make-up or nail polish.  Do not wear lotions, powders, or perfumes.  Do not shave 48 hours prior to surgery.  Do not bring valuables to the hospital.  Contacts, dentures or bridgework may not be worn into surgery.  Leave suitcase in the car. After surgery it may be brought to your room.  For patients admitted to the hospital, checkout time is 11:00 AM the day of discharge.   Patients discharged the day of surgery will not be allowed to drive home.  :     Please read over the following fact sheets that you were given: Coughing and Deep Breathing, Surgical Site Infection Prevention, Anesthesia Post-op Instructions and Care and Recovery After Surgery    Cataract A cataract is a clouding of the lens of the eye. When a lens becomes cloudy, vision is reduced based on the degree and nature of the clouding. Many cataracts reduce vision to some degree. Some cataracts make people more near-sighted as they develop. Other cataracts increase glare. Cataracts that are ignored and become worse can sometimes look white. The white color can be seen through the pupil. CAUSES   Aging. However, cataracts may occur at any age, even in newborns.   Certain drugs.   Trauma to the eye.   Certain diseases such as diabetes.   Specific eye diseases such as chronic inflammation inside the eye or a sudden attack of a rare form of glaucoma.   Inherited or acquired medical problems.  SYMPTOMS   Gradual, progressive  drop in vision in the affected eye.   Severe, rapid visual loss. This most often happens when trauma is the cause.  DIAGNOSIS  To detect a cataract, an eye doctor examines the lens. Cataracts are best diagnosed with an exam of the eyes with the pupils enlarged (dilated) by drops.  TREATMENT  For an early cataract, vision may improve by using different eyeglasses or stronger lighting. If that does not help your vision, surgery is the only effective treatment. A cataract needs to be surgically removed when vision loss interferes with your everyday activities, such as driving, reading, or watching TV. A cataract may also have to be removed if it prevents examination or treatment of another eye problem. Surgery removes the cloudy lens and usually replaces it with a substitute lens (intraocular lens, IOL).  At a time when both you and your doctor agree, the cataract will be surgically removed. If you have cataracts in both eyes, only one is usually removed at a time. This allows the operated eye to heal and be out of danger from any possible problems after surgery (such as infection or poor wound healing). In rare cases, a cataract may be doing damage to your eye. In these cases, your caregiver may advise surgical removal right away. The vast majority of people who have cataract surgery have better vision afterward. HOME CARE INSTRUCTIONS  If you are not planning surgery, you may be asked to do the following:  Use different eyeglasses.   Use stronger or brighter lighting.   Ask your eye doctor about reducing your medicine dose or changing medicines if it is thought that a medicine caused your cataract. Changing medicines does not make the cataract go away on its own.   Become familiar with your surroundings. Poor vision can lead to injury. Avoid bumping into things on the affected side. You are at a higher risk for tripping or falling.   Exercise extreme care when driving or operating machinery.    Wear sunglasses if you are sensitive to bright light or experiencing problems with glare.  SEEK IMMEDIATE MEDICAL CARE IF:   You have a worsening or sudden vision loss.   You notice redness, swelling, or increasing pain in the eye.   You have a fever.  Document Released: 05/20/2005 Document Revised: 05/09/2011 Document Reviewed: 01/11/2011 [facility] Patient Information 2012 [place].PATIENT INSTRUCTIONS POST-ANESTHESIA  IMMEDIATELY FOLLOWING SURGERY:  Do not drive or operate machinery for the first twenty four hours after surgery.  Do not make any important decisions for twenty four hours after surgery or while taking narcotic pain medications or sedatives.  If you develop intractable nausea and vomiting or a severe headache please notify your doctor immediately.  FOLLOW-UP:  Please make an appointment with your surgeon as instructed. You do not need to follow up with anesthesia unless specifically instructed to do so.  WOUND CARE INSTRUCTIONS (if applicable):  Keep a dry clean dressing on the anesthesia/puncture wound site if there is drainage.  Once the wound has quit draining you may leave it open to air.  Generally you should leave the bandage intact for twenty four hours unless there is drainage.  If the epidural site drains for more than 36-48 hours please call the anesthesia department.  QUESTIONS?:  Please feel free to call your physician or the hospital operator if you have any questions, and they will be happy to assist you.

## 2014-11-24 ENCOUNTER — Encounter (HOSPITAL_COMMUNITY): Payer: Self-pay

## 2014-11-24 ENCOUNTER — Encounter (HOSPITAL_COMMUNITY)
Admission: RE | Admit: 2014-11-24 | Discharge: 2014-11-24 | Disposition: A | Discharge status: Home / Self Care | Payer: Medicare Other | Source: Ambulatory Visit | Attending: Ophthalmology | Admitting: Ophthalmology

## 2014-11-24 DIAGNOSIS — Z01812 Encounter for preprocedural laboratory examination: Secondary | ICD-10-CM | POA: Insufficient documentation

## 2014-11-24 DIAGNOSIS — H259 Unspecified age-related cataract: Secondary | ICD-10-CM | POA: Diagnosis not present

## 2014-11-24 DIAGNOSIS — E119 Type 2 diabetes mellitus without complications: Secondary | ICD-10-CM | POA: Diagnosis not present

## 2014-11-24 HISTORY — DX: Family history of other specified conditions: Z84.89

## 2014-11-24 HISTORY — DX: Unspecified hearing loss, unspecified ear: H91.90

## 2014-11-24 LAB — BASIC METABOLIC PANEL
Anion gap: 12 (ref 5–15)
BUN: 29 mg/dL — ABNORMAL HIGH (ref 6–20)
CO2: 39 mmol/L — ABNORMAL HIGH (ref 22–32)
CREATININE: 0.98 mg/dL (ref 0.44–1.00)
Calcium: 8.5 mg/dL — ABNORMAL LOW (ref 8.9–10.3)
Chloride: 85 mmol/L — ABNORMAL LOW (ref 101–111)
GFR calc non Af Amer: >60 mL/min (ref >60)
Glucose, Bld: 227 mg/dL — ABNORMAL HIGH (ref 65–99)
Potassium: 3.7 mmol/L (ref 3.5–5.1)
Sodium: 136 mmol/L (ref 135–145)

## 2014-11-24 LAB — CBC WITH DIFFERENTIAL/PLATELET
Basophils Absolute: 0 10*3/uL (ref 0.0–0.1)
Basophils Relative: 0 % (ref 0–1)
EOS ABS: 0.3 10*3/uL (ref 0.0–0.7)
EOS PCT: 2 % (ref 0–5)
HEMATOCRIT: 34.4 % — AB (ref 36.0–46.0)
HEMOGLOBIN: 10.2 g/dL — AB (ref 12.0–15.0)
LYMPHS ABS: 1.9 10*3/uL (ref 0.7–4.0)
Lymphocytes Relative: 15 % (ref 12–46)
MCH: 24.6 pg — AB (ref 26.0–34.0)
MCHC: 29.7 g/dL — ABNORMAL LOW (ref 30.0–36.0)
MCV: 82.9 fL (ref 78.0–100.0)
MONO ABS: 0.4 10*3/uL (ref 0.1–1.0)
MONOS PCT: 4 % (ref 3–12)
Neutro Abs: 9.6 10*3/uL — ABNORMAL HIGH (ref 1.7–7.7)
Neutrophils Relative %: 79 % — ABNORMAL HIGH (ref 43–77)
Platelets: 333 10*3/uL (ref 150–400)
RBC: 4.15 MIL/uL (ref 3.87–5.11)
RDW: 14.8 % (ref 11.5–15.5)
WBC: 12.2 10*3/uL — ABNORMAL HIGH (ref 4.0–10.5)

## 2014-11-24 LAB — MAGNESIUM: Magnesium: 2 mg/dL (ref 1.7–2.4)

## 2014-11-24 LAB — CK: Total CK: 40 U/L (ref 38–234)

## 2014-11-24 NOTE — Pre-Procedure Instructions (Signed)
Dr Patsey Berthold aware that patients Uncle was positive for Malignant Hyperthermia. Patient states she had testing done and she was negative.

## 2014-11-29 ENCOUNTER — Encounter (HOSPITAL_COMMUNITY): Payer: Self-pay | Admitting: Anesthesiology

## 2014-11-29 ENCOUNTER — Encounter (HOSPITAL_COMMUNITY)
Admission: RE | Disposition: A | Discharge status: Home / Self Care | Payer: Self-pay | Source: Ambulatory Visit | Attending: Ophthalmology

## 2014-11-29 ENCOUNTER — Ambulatory Visit (HOSPITAL_COMMUNITY): Payer: Medicare Other | Admitting: Anesthesiology

## 2014-11-29 ENCOUNTER — Ambulatory Visit (HOSPITAL_COMMUNITY)
Admission: RE | Admit: 2014-11-29 | Discharge: 2014-11-29 | Disposition: A | Discharge status: Home / Self Care | Payer: Medicare Other | Source: Ambulatory Visit | Attending: Ophthalmology | Admitting: Ophthalmology

## 2014-11-29 DIAGNOSIS — G4730 Sleep apnea, unspecified: Secondary | ICD-10-CM | POA: Insufficient documentation

## 2014-11-29 DIAGNOSIS — M1990 Unspecified osteoarthritis, unspecified site: Secondary | ICD-10-CM | POA: Insufficient documentation

## 2014-11-29 DIAGNOSIS — Z79899 Other long term (current) drug therapy: Secondary | ICD-10-CM | POA: Insufficient documentation

## 2014-11-29 DIAGNOSIS — I252 Old myocardial infarction: Secondary | ICD-10-CM | POA: Insufficient documentation

## 2014-11-29 DIAGNOSIS — E1165 Type 2 diabetes mellitus with hyperglycemia: Secondary | ICD-10-CM | POA: Diagnosis not present

## 2014-11-29 DIAGNOSIS — I10 Essential (primary) hypertension: Secondary | ICD-10-CM | POA: Insufficient documentation

## 2014-11-29 DIAGNOSIS — Z9981 Dependence on supplemental oxygen: Secondary | ICD-10-CM | POA: Diagnosis not present

## 2014-11-29 DIAGNOSIS — F319 Bipolar disorder, unspecified: Secondary | ICD-10-CM | POA: Insufficient documentation

## 2014-11-29 DIAGNOSIS — I509 Heart failure, unspecified: Secondary | ICD-10-CM | POA: Diagnosis not present

## 2014-11-29 DIAGNOSIS — H25011 Cortical age-related cataract, right eye: Secondary | ICD-10-CM | POA: Insufficient documentation

## 2014-11-29 DIAGNOSIS — M797 Fibromyalgia: Secondary | ICD-10-CM | POA: Diagnosis not present

## 2014-11-29 DIAGNOSIS — N289 Disorder of kidney and ureter, unspecified: Secondary | ICD-10-CM | POA: Diagnosis not present

## 2014-11-29 DIAGNOSIS — H2511 Age-related nuclear cataract, right eye: Secondary | ICD-10-CM | POA: Diagnosis not present

## 2014-11-29 DIAGNOSIS — Z79891 Long term (current) use of opiate analgesic: Secondary | ICD-10-CM | POA: Diagnosis not present

## 2014-11-29 DIAGNOSIS — K219 Gastro-esophageal reflux disease without esophagitis: Secondary | ICD-10-CM | POA: Diagnosis not present

## 2014-11-29 DIAGNOSIS — J45909 Unspecified asthma, uncomplicated: Secondary | ICD-10-CM | POA: Diagnosis not present

## 2014-11-29 DIAGNOSIS — H2512 Age-related nuclear cataract, left eye: Secondary | ICD-10-CM | POA: Diagnosis not present

## 2014-11-29 DIAGNOSIS — Z87891 Personal history of nicotine dependence: Secondary | ICD-10-CM | POA: Insufficient documentation

## 2014-11-29 DIAGNOSIS — H25012 Cortical age-related cataract, left eye: Secondary | ICD-10-CM | POA: Diagnosis not present

## 2014-11-29 DIAGNOSIS — H269 Unspecified cataract: Secondary | ICD-10-CM | POA: Diagnosis not present

## 2014-11-29 DIAGNOSIS — I2510 Atherosclerotic heart disease of native coronary artery without angina pectoris: Secondary | ICD-10-CM | POA: Diagnosis not present

## 2014-11-29 DIAGNOSIS — J449 Chronic obstructive pulmonary disease, unspecified: Secondary | ICD-10-CM | POA: Diagnosis not present

## 2014-11-29 HISTORY — PX: CATARACT EXTRACTION W/PHACO: SHX586

## 2014-11-29 LAB — GLUCOSE, CAPILLARY: GLUCOSE-CAPILLARY: 211 mg/dL — AB (ref 65–99)

## 2014-11-29 SURGERY — PHACOEMULSIFICATION, CATARACT, WITH IOL INSERTION
Anesthesia: Monitor Anesthesia Care | Site: Eye | Laterality: Right

## 2014-11-29 MED ORDER — FENTANYL CITRATE (PF) 100 MCG/2ML IJ SOLN
INTRAMUSCULAR | Status: AC
  Filled 2014-11-29: qty 2

## 2014-11-29 MED ORDER — FENTANYL CITRATE (PF) 100 MCG/2ML IJ SOLN
25.0000 ug | Freq: Once | INTRAMUSCULAR | Status: AC
  Administered 2014-11-29: 25 ug via INTRAVENOUS

## 2014-11-29 MED ORDER — CYCLOPENTOLATE-PHENYLEPHRINE 0.2-1 % OP SOLN
1.0000 [drp] | OPHTHALMIC | Status: AC
  Administered 2014-11-29 (×3): 1 [drp] via OPHTHALMIC

## 2014-11-29 MED ORDER — KETOROLAC TROMETHAMINE 0.5 % OP SOLN
1.0000 [drp] | OPHTHALMIC | Status: AC
  Administered 2014-11-29 (×3): 1 [drp] via OPHTHALMIC

## 2014-11-29 MED ORDER — MIDAZOLAM HCL 2 MG/2ML IJ SOLN
INTRAMUSCULAR | Status: AC
  Filled 2014-11-29: qty 2

## 2014-11-29 MED ORDER — LACTATED RINGERS IV SOLN
INTRAVENOUS | Status: DC
  Administered 2014-11-29: 1000 mL via INTRAVENOUS

## 2014-11-29 MED ORDER — EPINEPHRINE HCL 1 MG/ML IJ SOLN
INTRAMUSCULAR | Status: DC | PRN
  Administered 2014-11-29: 500 mL

## 2014-11-29 MED ORDER — EPINEPHRINE HCL 1 MG/ML IJ SOLN
INTRAMUSCULAR | Status: AC
  Filled 2014-11-29: qty 1

## 2014-11-29 MED ORDER — TETRACAINE HCL 0.5 % OP SOLN
1.0000 [drp] | OPHTHALMIC | Status: AC
  Administered 2014-11-29 (×3): 1 [drp] via OPHTHALMIC

## 2014-11-29 MED ORDER — PROVISC 10 MG/ML IO SOLN
INTRAOCULAR | Status: DC | PRN
  Administered 2014-11-29: 0.85 mL via INTRAOCULAR

## 2014-11-29 MED ORDER — BSS IO SOLN
INTRAOCULAR | Status: DC | PRN
  Administered 2014-11-29: 15 mL

## 2014-11-29 MED ORDER — FENTANYL CITRATE (PF) 100 MCG/2ML IJ SOLN: 25.0000 ug | INTRAMUSCULAR | Status: DC | PRN

## 2014-11-29 MED ORDER — ONDANSETRON HCL 4 MG/2ML IJ SOLN: 4.0000 mg | Freq: Once | INTRAMUSCULAR | Status: DC | PRN

## 2014-11-29 MED ORDER — PHENYLEPHRINE HCL 2.5 % OP SOLN
1.0000 [drp] | OPHTHALMIC | Status: AC
  Administered 2014-11-29 (×3): 1 [drp] via OPHTHALMIC

## 2014-11-29 MED ORDER — MIDAZOLAM HCL 2 MG/2ML IJ SOLN: 1.0000 mg | INTRAMUSCULAR | Status: DC | PRN

## 2014-11-29 SURGICAL SUPPLY — 9 items
CLOTH BEACON ORANGE TIMEOUT ST (SAFETY) ×2 IMPLANT
EYE SHIELD UNIVERSAL CLEAR (GAUZE/BANDAGES/DRESSINGS) ×2 IMPLANT
GLOVE BIO SURGEON STRL SZ 6.5 (GLOVE) ×2 IMPLANT
GLOVE EXAM NITRILE MD LF STRL (GLOVE) ×2 IMPLANT
LENS ALC ACRYL/TECN (Ophthalmic Related) ×2 IMPLANT
PAD ARMBOARD 7.5X6 YLW CONV (MISCELLANEOUS) ×2 IMPLANT
TAPE SURG TRANSPORE 1 IN (GAUZE/BANDAGES/DRESSINGS) ×1 IMPLANT
TAPE SURGICAL TRANSPORE 1 IN (GAUZE/BANDAGES/DRESSINGS) ×1
WATER STERILE IRR 250ML POUR (IV SOLUTION) ×2 IMPLANT

## 2014-11-29 NOTE — Progress Notes (Signed)
Patient complaining of neck pain (4) after moving  herself up in the eye chair. Patient told to not move any more. Stay in the position that she was in. Pillow applied under neck. Patient stated that pain in neck decreased to a 2. Then, to 0. VSS. Leanne Chang and Vonda Antigua witnessed the move.

## 2014-11-29 NOTE — Anesthesia Procedure Notes (Signed)
Procedure Name: MAC Date/Time: 11/29/2014 8:15 AM Performed by: Andree Elk, Marites Nath A Pre-anesthesia Checklist: Patient identified, Timeout performed, Emergency Drugs available, Suction available and Patient being monitored Oxygen Delivery Method: Nasal cannula

## 2014-11-29 NOTE — Discharge Instructions (Signed)
Denise Freeman  11/29/2014           [facility] Instructions [place] [id number] [address]      1. Avoid closing eyes tightly. One often closes the eye tightly when laughing, talking, sneezing, coughing or if they feel irritated. At these times, you should be careful not to close your eyes tightly.  2. Instill eye drops as instructed. To instill drops in your eye, open it, look up and have someone gently pull the lower lid down and instill a couple of drops inside the lower lid.  3. Do not touch upper lid.  4. Take Advil or Tylenol for pain.  5. You may use either eye for near work, such as reading or sewing and you may watch television.  6. You may have your hair done at the beauty parlor at any time.  7. Wear dark glasses with or without your own glasses if you are in bright light.  8. Call our office at [phone] or [phone] if you have sharp pain in your eye or unusual symptoms.  9. Do not be concerned because vision in the operative eye is not good. It will not be good, no matter how successful the operation, until you get a special lens for it. Your old glasses will not be suited to the new eye that was operated on and you will not be ready for a new lens for about a month.  10. Follow up at the [facility] office.    I have received a copy of the above instructions and will follow them.

## 2014-11-29 NOTE — Transfer of Care (Signed)
Immediate Anesthesia Transfer of Care Note  Patient: Denise Freeman  Procedure(s) Performed: Procedure(s) with comments: CATARACT EXTRACTION PHACO AND INTRAOCULAR LENS PLACEMENT (IOC) (Right) - CDE:3.81  Patient Location: Short Stay  Anesthesia Type:MAC  Level of Consciousness: awake, alert , oriented and patient cooperative  Airway & Oxygen Therapy: Patient Spontanous Breathing  Post-op Assessment: Report given to RN and Post -op Vital signs reviewed and stable  Post vital signs: Reviewed and stable  Last Vitals:  Filed Vitals:   11/29/14 0815  BP: 104/50  Pulse:   Temp:   Resp: 39    Complications: No apparent anesthesia complications

## 2014-11-29 NOTE — Anesthesia Postprocedure Evaluation (Signed)
  Anesthesia Post-op Note  Patient: Denise Freeman  Procedure(s) Performed: Procedure(s) with comments: CATARACT EXTRACTION PHACO AND INTRAOCULAR LENS PLACEMENT (IOC) (Right) - CDE:3.81  Patient Location: Short Stay  Anesthesia Type:MAC  Level of Consciousness: awake, alert , oriented and patient cooperative  Airway and Oxygen Therapy: Patient Spontanous Breathing  Post-op Pain: none  Post-op Assessment: Post-op Vital signs reviewed, [name] Cardiovascular Status Stable, Respiratory Function Stable, No signs of Nausea or vomiting and Pain level controlled              Post-op Vital Signs: Reviewed and stable  Last Vitals:  Filed Vitals:   11/29/14 0815  BP: 104/50  Pulse:   Temp:   Resp: 39    Complications: No apparent anesthesia complications

## 2014-11-29 NOTE — Op Note (Signed)
Patient brought to the operating room and prepped and draped in the usual manner.  Lid speculum inserted in right eye.  Stab incision made at the twelve o'clock position.  Provisc instilled in the anterior chamber.   A 2.4 mm. Stab incision was made temporally.  An anterior capsulotomy was done with a bent 25 gauge needle.  The nucleus was hydrodissected.  The Phaco tip was inserted in the anterior chamber and the nucleus was emulsified.  CDE was 3.81.  The cortical material was then removed with the I and A tip.  Posterior capsule was the polished.  The anterior chamber was deepened with Provisc.  A 24.0 Alcon [id number] IOL was then inserted in the capsular bag.  Provisc was then removed with the I and A tip.  The wound was then hydrated.  Patient sent to the Recovery Room in good condition with follow up in my office.  Preoperative Diagnosis:  Nuclear Cataract OD Postoperative Diagnosis:  Same Procedure name: Kelman Phacoemulsification OD with IOL

## 2014-11-29 NOTE — H&P (Signed)
The patient was re examined and there is no change in the patients condition since the original H and P. 

## 2014-11-29 NOTE — Anesthesia Preprocedure Evaluation (Signed)
Anesthesia Evaluation  Patient identified by MRN, date of birth, ID band Patient awake    Reviewed: Allergy & Precautions, NPO status , [name] Chart, lab work & pertinent test results  History of Anesthesia Complications (+) Family history of anesthesia reaction and history of anesthetic complications (Family hx of MH, pt was tested negative.)  Airway Mallampati: IV  TM Distance: <3 FB     Dental  (+) Edentulous Upper, Edentulous Lower   Pulmonary shortness of breath, asthma , sleep apnea , COPDformer smoker,  breath sounds clear to auscultation        Cardiovascular + CAD, + Past MI and +CHF Rhythm:Regular Rate:Normal     Neuro/Psych PSYCHIATRIC DISORDERS Bipolar Disorder    GI/Hepatic hiatal hernia, GERD-  ,  Endo/Other  diabetes, Poorly Controlled  Renal/GU Renal disease     Musculoskeletal  (+) Fibromyalgia -  Abdominal   Peds  Hematology  (+) Blood dyscrasia, anemia ,   Anesthesia Other Findings   Reproductive/Obstetrics                             Anesthesia Physical Anesthesia Plan  ASA: IV  Anesthesia Plan: MAC   Post-op Pain Management:    Induction: Intravenous  Airway Management Planned: Nasal Cannula  Additional Equipment:   Intra-op Plan:   Post-operative Plan:   Informed Consent: I have reviewed the patients History and Physical, chart, labs and discussed the procedure including the risks, benefits and alternatives for the proposed anesthesia with the patient or authorized representative who has indicated his/her understanding and acceptance.     Plan Discussed with:   Anesthesia Plan Comments:         Anesthesia Quick Evaluation

## 2014-11-30 ENCOUNTER — Encounter (HOSPITAL_COMMUNITY): Payer: Self-pay | Admitting: Ophthalmology

## 2014-12-01 ENCOUNTER — Encounter: Payer: Self-pay | Admitting: Internal Medicine

## 2014-12-01 ENCOUNTER — Ambulatory Visit (INDEPENDENT_AMBULATORY_CARE_PROVIDER_SITE_OTHER): Payer: Medicare Other | Admitting: Internal Medicine

## 2014-12-01 VITALS — BP 122/70 | Ht 59.0 in | Wt 227.5 lb

## 2014-12-01 DIAGNOSIS — G4733 Obstructive sleep apnea (adult) (pediatric): Secondary | ICD-10-CM

## 2014-12-01 DIAGNOSIS — I482 Chronic atrial fibrillation, unspecified: Secondary | ICD-10-CM

## 2014-12-01 DIAGNOSIS — R600 Localized edema: Secondary | ICD-10-CM

## 2014-12-01 DIAGNOSIS — I5032 Chronic diastolic (congestive) heart failure: Secondary | ICD-10-CM | POA: Diagnosis not present

## 2014-12-01 MED ORDER — RIVAROXABAN 20 MG PO TABS: 20.0000 mg | ORAL_TABLET | Freq: Every day | ORAL | Status: DC

## 2014-12-01 MED ORDER — DILTIAZEM HCL 30 MG PO TABS
30.0000 mg | ORAL_TABLET | Freq: Two times a day (BID) | ORAL | Status: DC
Start: 2014-12-01 — End: 2015-05-02

## 2014-12-01 NOTE — Progress Notes (Signed)
OFFICE NOTE  Chief Complaint:  Follow-up leg swelling  Primary Care Physician: London Pepper, MD  HPI:  Denise Freeman is a pleasant 64 year old female who is establishing cardiac care today. She is accompanied by her husband and they recently moved here from [place], [place]. She has a history of severe COPD on home oxygen. She also has a history of stress-induced cardiomyopathy in the past. She has since had recovery of her EF. She's had numerous cardiac catheterizations. None of which showed obstructive coronary disease. Her last cardiac catheterization was in October 2014 which demonstrated no significant coronary disease. This was after a small abnormality was noted in the apex suggestive of ischemia on a nuclear stress test. She does have a history of permanent atrial fibrillation on Coumadin. She will need to have her INRs followed here. Unfortunate she has not had her INR checked in over 9 weeks and it was assessed today and was low. We will need to adjust her medication. She will be established in our anticoagulation clinic. Blood pressure looks well controlled today. She is on cholesterol medication.  I saw Denise Freeman back today in the office. Unfortunate she was hospitalized in December for worsening shortness of breath and probable pneumonia with a mild diastolic heart failure exacerbation. She was given IV diuretics and her Lasix was increased to 40 mg 3 times a day. She's also taking metolazone 3 times weekly. She continues to complain of leg edema which is mostly dependent. She has significant shortness of breath and CO2 retention and an appointment with a pulmonologist is pending.  Denise Freeman returned today back in the office. Her main complaints are a number of excoriations and weeping lesions on her face and arms. She apparently is going to see a dermatologist about this. She's also had worsening leg swelling and shortness of breath. Her weight is now up about 8 pounds since her  last visit. She was instructed to take Lasix 40 mg 3 times a day but apparently she is taking it twice a day. She is taking the metolazone 3 times a week. She is wearing compression stockings which I had advised and seems that this helps her swelling.  Denise Freeman returns today and reports a big improvement in her leg swelling. She's currently on Lasix 60 mg twice daily and metolazone 3x weekly. Weight is come down and swelling is improved. Her BNP was over 250 and is come down to about 125.  PMHx:  Past Medical History  Diagnosis Date  . Diabetes mellitus without complication   . Bipolar 1 disorder   . Fibromyalgia   . COPD (chronic obstructive pulmonary disease)   . CHF (congestive heart failure)   . Asthma   . Atrial fibrillation   . Neuropathy     Disc Back   . Fibromyalgia   . CHF (congestive heart failure)   . Coronary artery disease   . Anemia   . History of hiatal hernia   . GERD (gastroesophageal reflux disease)   . Shortness of breath dyspnea   . Family history of adverse reaction to anesthesia     Uncle was positive for malignant hyerthermia; patient had testing done and was negative.  . Myocardial infarction 1990  . Sleep apnea     uses biPAP, 10  . Arthritis   . HOH (hard of hearing)     Past Surgical History  Procedure Laterality Date  . Heel spur surgery Bilateral   . Appendectomy    .  Bladder suspension      2003, 2006 and 2010  . Neck surgery N/A 2009    4, 6, and 7 cervical disc replaced  . Bladder stimulator      pt states, "it cannot be turned off".  . Hernia repair      umbilical  . Cataract extraction w/phaco Right 11/29/2014    Procedure: CATARACT EXTRACTION PHACO AND INTRAOCULAR LENS PLACEMENT (IOC);  Surgeon: Rutherford Guys, MD;  Location: [facility];  Service: Ophthalmology;  Laterality: Right;  CDE:3.81    FAMHx:  Family History  Problem Relation Age of Onset  . Heart disease Mother   . COPD Mother   . Cancer Mother     Breast cancer  .  Diabetes Mother   . Heart disease Father   . COPD Sister   . Heart disease Sister   . Diabetes Sister   . Heart disease Maternal Grandmother     SOCHx:   reports that she quit smoking about 25 years ago. Her smoking use included Cigarettes. She has a 28 pack-year smoking history. She does not have any smokeless tobacco history on file. She reports that she does not drink alcohol or use illicit drugs.  ALLERGIES:  Allergies  Allergen Reactions  . Ancef [Cefazolin] Nausea And Vomiting  . Augmentin [Amoxicillin-Pot Clavulanate] Nausea Only  . Ciprofloxacin Nausea And Vomiting  . Haldol [Haloperidol] Other (See Comments)    Restless leg  . Levaquin [Levofloxacin In D5w] Other (See Comments)    "afib"  . Nsaids Diarrhea  . Tamiflu [Oseltamivir Phosphate] Other (See Comments)    "water blisters"  . Zoloft [Sertraline Hcl] Other (See Comments)    Jaw problems    ROS: A comprehensive review of systems was negative except for: Respiratory: positive for dyspnea on exertion Cardiovascular: positive for lower extremity edema Integument/breast: positive for skin lesion(s) Musculoskeletal: positive for myalgias  HOME MEDS: Current Outpatient Prescriptions  Medication Sig Dispense Refill  . albuterol (PROVENTIL) (2.5 MG/3ML) 0.083% nebulizer solution Take 2.5 mg by nebulization every 6 (six) hours as needed for wheezing or shortness of breath.    Marland Kitchen atorvastatin (LIPITOR) 20 MG tablet Take 1 tablet (20 mg total) by mouth daily. 30 tablet 1  . budesonide (PULMICORT) 0.5 MG/2ML nebulizer solution Take 2 mLs (0.5 mg total) by nebulization 2 (two) times daily. DX J43.8 120 mL 6  . cetirizine (ZYRTEC) 10 MG tablet Take 10 mg by mouth daily.    Marland Kitchen diltiazem (CARDIZEM) 30 MG tablet Take 1 tablet (30 mg total) by mouth 2 (two) times daily. 180 tablet 1  . diphenhydrAMINE (BENADRYL) 25 mg capsule Take 25 mg by mouth every 6 (six) hours as needed.    . docusate sodium (COLACE) 100 MG capsule Take 100  mg by mouth daily.    Marland Kitchen escitalopram (LEXAPRO) 10 MG tablet Take 10 mg by mouth at bedtime.     . famotidine (PEPCID) 40 MG tablet Take 20 mg by mouth 2 (two) times daily.     . fenofibrate 54 MG tablet Take 1 tablet (54 mg total) by mouth daily. 90 tablet 1  . fluticasone (FLONASE) 50 MCG/ACT nasal spray Place 1 spray into both nostrils 2 (two) times daily.    . furosemide (LASIX) 40 MG tablet Take 1.5 tablets (60 mg total) by mouth 2 (two) times daily. 270 tablet 1  . Insulin Glargine (TOUJEO SOLOSTAR) 300 UNIT/ML SOPN Inject 60 Units into the skin at bedtime. 18 pen 1  . insulin lispro (  HUMALOG KWIKPEN) 100 UNIT/ML KiwkPen Inject 0.18-0.24 mLs (18-24 Units total) into the skin 3 (three) times daily. (Patient taking differently: Inject 18-22 Units into the skin 3 (three) times daily. 18 UNITS TWICE DAILY AND 22 UNITS IN THE EVENING) 20 pen 1  . Insulin Pen Needle (CAREFINE PEN NEEDLES) 32G X 4 MM MISC Use 4x a day 200 each 3  . ipratropium (ATROVENT) 0.02 % nebulizer solution Take 0.5 mg by nebulization 2 (two) times daily.    . iron polysaccharides (NIFEREX) 150 MG capsule Take 1 capsule (150 mg total) by mouth 2 (two) times daily. 60 capsule 0  . metolazone (ZAROXOLYN) 2.5 MG tablet Take 1 tablet (2.5 mg total) by mouth daily. 90 tablet 1  . mirtazapine (REMERON) 15 MG tablet Take 15 mg by mouth at bedtime.    . modafinil (PROVIGIL) 200 MG tablet Take 200 mg by mouth daily.    Marland Kitchen nystatin (MYCOSTATIN/NYSTOP) 100000 UNIT/GM POWD Apply topically 2 (two) times daily. USES UNDERNEATH BREAST    . ondansetron (ZOFRAN-ODT) 4 MG disintegrating tablet Take 4 mg by mouth every 8 (eight) hours as needed for nausea or vomiting.    Marland Kitchen oxyCODONE (OXY IR/ROXICODONE) 5 MG immediate release tablet Take 5 mg by mouth 2 (two) times daily.    . OXYGEN Inhale into the lungs continuous. 3 liters 24/7    . pantoprazole (PROTONIX) 40 MG tablet Take 1 tablet (40 mg total) by mouth daily. 30 tablet 0  . potassium  chloride SA (K-DUR,KLOR-CON) 20 MEQ tablet Take 20 mEq by mouth 2 (two) times daily.     . pregabalin (LYRICA) 50 MG capsule Take 50 mg by mouth 3 (three) times daily.    . rivaroxaban (XARELTO) 20 MG TABS tablet Take 1 tablet (20 mg total) by mouth daily with supper. 90 tablet 1   No current facility-administered medications for this visit.    LABS/IMAGING: No results found for this or any previous visit (from the past 48 hour(s)). No results found.  VITALS: BP 122/70 mmHg  Ht 4\' 11"  (1.499 m)  Wt 227 lb 8 oz (103.193 kg)  BMI 45.92 kg/m2  EXAM: General appearance: alert, appears older than stated age and no distress Neck: JVD - 3 cm above sternal notch and no carotid bruit Lungs: diminished breath sounds bilaterally and wheezes bilaterally Heart: regular rate and rhythm, S1, S2 normal, no murmur, click, rub or gallop Abdomen: soft, non-tender; bowel sounds normal; no masses,  no organomegaly and obese Extremities: edema 1+ bilateral, stockings in place Pulses: 2+ and symmetric Skin: Numerous, weeping lesions Neurologic: Grossly normal Psych: Somewhat manic  EKG: Atrial fibrillation with controlled ventricular response at 90  ASSESSMENT: 1. Permanent atrial fibrillation on warfarin 2. Acute on chronic diastolic congestive heart failure 3. History of stress-induced cardiomyopathy with normalization of her EF 4. No significant obstructive coronary disease after multiple catheterizations in 1996, 99, 2007 and 2014. 5. Fibromyalgia 6. Bipolar 1 disorder 7. Dyslipidemia 8. Hypertension 9. Severe COPD on home oxygen 10. Obstructive sleep apnea on CPAP 11. DNR  PLAN: 1.   Denise Freeman has had improvement in her edema and breathing to a mild extent. She remains on oxygen. She is very deconditioned and requires a scooter to get around. Her BNP is now lower and her leg edema has resolved almost totally. I think she is currently on the appropriate dose of dye read it. We'll plan to  continue those diuretics and see her back in 6 months.  We had  a long talk today in the office about her declining health and particularly her home in her status. She indicated that she does not want to ever be on a breathing machine in fact she says that she does not want to be resuscitated. After confirming her wishes I helped her fill out DO NOT RESUSCITATE paperwork. I've encouraged her to keep her DO NOT RESUSCITATE order with her as much as possible.  Pixie Casino, MD, [facility] Attending Cardiologist Pearland C Hilty 12/01/2014, 10:01 PM

## 2014-12-01 NOTE — Patient Instructions (Signed)
Your xarelto and cardiazem have been refilled.   Your physician wants you to follow-up in: 6 months with [name]. You will receive a reminder letter in the mail two months in advance. If you don't receive a letter, please call our office to schedule the follow-up appointment.

## 2014-12-07 MED ORDER — LIDOCAINE HCL 3.5 % OP GEL: 1.0000 "application " | Freq: Once | OPHTHALMIC | Status: DC

## 2014-12-08 ENCOUNTER — Encounter (HOSPITAL_COMMUNITY)
Admission: RE | Admit: 2014-12-08 | Discharge: 2014-12-08 | Disposition: A | Discharge status: Home / Self Care | Payer: Medicare Other | Source: Ambulatory Visit | Attending: Ophthalmology | Admitting: Ophthalmology

## 2014-12-08 ENCOUNTER — Encounter (HOSPITAL_COMMUNITY): Payer: Self-pay

## 2014-12-12 DIAGNOSIS — M542 Cervicalgia: Secondary | ICD-10-CM | POA: Diagnosis not present

## 2014-12-12 DIAGNOSIS — G8929 Other chronic pain: Secondary | ICD-10-CM | POA: Diagnosis not present

## 2014-12-12 DIAGNOSIS — M545 Low back pain: Secondary | ICD-10-CM | POA: Diagnosis not present

## 2014-12-12 DIAGNOSIS — Z79891 Long term (current) use of opiate analgesic: Secondary | ICD-10-CM | POA: Diagnosis not present

## 2014-12-12 MED ORDER — PHENYLEPHRINE HCL 2.5 % OP SOLN
OPHTHALMIC | Status: AC
  Filled 2014-12-12: qty 15

## 2014-12-12 MED ORDER — TETRACAINE HCL 0.5 % OP SOLN
OPHTHALMIC | Status: AC
  Filled 2014-12-12: qty 2

## 2014-12-12 MED ORDER — CYCLOPENTOLATE-PHENYLEPHRINE OP SOLN OPTIME - NO CHARGE
OPHTHALMIC | Status: AC
  Filled 2014-12-12: qty 2

## 2014-12-12 MED ORDER — KETOROLAC TROMETHAMINE 0.5 % OP SOLN
OPHTHALMIC | Status: AC
  Filled 2014-12-12: qty 5

## 2014-12-13 ENCOUNTER — Ambulatory Visit (HOSPITAL_COMMUNITY): Payer: Medicare Other | Admitting: Anesthesiology

## 2014-12-13 ENCOUNTER — Ambulatory Visit (HOSPITAL_COMMUNITY)
Admission: RE | Admit: 2014-12-13 | Discharge: 2014-12-13 | Disposition: A | Discharge status: Home / Self Care | Payer: Medicare Other | Source: Ambulatory Visit | Attending: Ophthalmology | Admitting: Ophthalmology

## 2014-12-13 ENCOUNTER — Encounter (HOSPITAL_COMMUNITY)
Admission: RE | Disposition: A | Discharge status: Home / Self Care | Payer: Self-pay | Source: Ambulatory Visit | Attending: Ophthalmology

## 2014-12-13 ENCOUNTER — Encounter (HOSPITAL_COMMUNITY): Payer: Self-pay | Admitting: *Deleted

## 2014-12-13 DIAGNOSIS — J449 Chronic obstructive pulmonary disease, unspecified: Secondary | ICD-10-CM | POA: Diagnosis not present

## 2014-12-13 DIAGNOSIS — M1990 Unspecified osteoarthritis, unspecified site: Secondary | ICD-10-CM | POA: Diagnosis not present

## 2014-12-13 DIAGNOSIS — H25012 Cortical age-related cataract, left eye: Secondary | ICD-10-CM | POA: Insufficient documentation

## 2014-12-13 DIAGNOSIS — I252 Old myocardial infarction: Secondary | ICD-10-CM | POA: Insufficient documentation

## 2014-12-13 DIAGNOSIS — H2512 Age-related nuclear cataract, left eye: Secondary | ICD-10-CM | POA: Diagnosis not present

## 2014-12-13 DIAGNOSIS — J45909 Unspecified asthma, uncomplicated: Secondary | ICD-10-CM | POA: Insufficient documentation

## 2014-12-13 DIAGNOSIS — Z87891 Personal history of nicotine dependence: Secondary | ICD-10-CM | POA: Diagnosis not present

## 2014-12-13 DIAGNOSIS — I509 Heart failure, unspecified: Secondary | ICD-10-CM | POA: Diagnosis not present

## 2014-12-13 DIAGNOSIS — N289 Disorder of kidney and ureter, unspecified: Secondary | ICD-10-CM | POA: Insufficient documentation

## 2014-12-13 DIAGNOSIS — Z79899 Other long term (current) drug therapy: Secondary | ICD-10-CM | POA: Diagnosis not present

## 2014-12-13 DIAGNOSIS — I10 Essential (primary) hypertension: Secondary | ICD-10-CM | POA: Diagnosis not present

## 2014-12-13 DIAGNOSIS — K219 Gastro-esophageal reflux disease without esophagitis: Secondary | ICD-10-CM | POA: Insufficient documentation

## 2014-12-13 DIAGNOSIS — M797 Fibromyalgia: Secondary | ICD-10-CM | POA: Insufficient documentation

## 2014-12-13 DIAGNOSIS — Z9981 Dependence on supplemental oxygen: Secondary | ICD-10-CM | POA: Insufficient documentation

## 2014-12-13 DIAGNOSIS — F319 Bipolar disorder, unspecified: Secondary | ICD-10-CM | POA: Insufficient documentation

## 2014-12-13 DIAGNOSIS — H269 Unspecified cataract: Secondary | ICD-10-CM | POA: Diagnosis not present

## 2014-12-13 DIAGNOSIS — Z79891 Long term (current) use of opiate analgesic: Secondary | ICD-10-CM | POA: Insufficient documentation

## 2014-12-13 DIAGNOSIS — E1165 Type 2 diabetes mellitus with hyperglycemia: Secondary | ICD-10-CM | POA: Insufficient documentation

## 2014-12-13 DIAGNOSIS — I2510 Atherosclerotic heart disease of native coronary artery without angina pectoris: Secondary | ICD-10-CM | POA: Diagnosis not present

## 2014-12-13 HISTORY — PX: CATARACT EXTRACTION W/PHACO: SHX586

## 2014-12-13 LAB — GLUCOSE, CAPILLARY: Glucose-Capillary: 299 mg/dL — ABNORMAL HIGH (ref 65–99)

## 2014-12-13 SURGERY — PHACOEMULSIFICATION, CATARACT, WITH IOL INSERTION
Anesthesia: Monitor Anesthesia Care | Site: Eye | Laterality: Left

## 2014-12-13 MED ORDER — PROVISC 10 MG/ML IO SOLN
INTRAOCULAR | Status: DC | PRN
  Administered 2014-12-13: 0.85 mL via INTRAOCULAR

## 2014-12-13 MED ORDER — EPINEPHRINE HCL 1 MG/ML IJ SOLN
INTRAMUSCULAR | Status: AC
  Filled 2014-12-13: qty 1

## 2014-12-13 MED ORDER — KETOROLAC TROMETHAMINE 0.5 % OP SOLN
1.0000 [drp] | OPHTHALMIC | Status: AC
  Administered 2014-12-13 (×3): 1 [drp] via OPHTHALMIC

## 2014-12-13 MED ORDER — FENTANYL CITRATE (PF) 100 MCG/2ML IJ SOLN
25.0000 ug | INTRAMUSCULAR | Status: AC
  Administered 2014-12-13: 25 ug via INTRAVENOUS

## 2014-12-13 MED ORDER — FENTANYL CITRATE (PF) 100 MCG/2ML IJ SOLN
INTRAMUSCULAR | Status: AC
  Filled 2014-12-13: qty 2

## 2014-12-13 MED ORDER — PHENYLEPHRINE HCL 2.5 % OP SOLN
1.0000 [drp] | OPHTHALMIC | Status: AC
  Administered 2014-12-13 (×3): 1 [drp] via OPHTHALMIC

## 2014-12-13 MED ORDER — TETRACAINE HCL 0.5 % OP SOLN
1.0000 [drp] | OPHTHALMIC | Status: AC
  Administered 2014-12-13 (×3): 1 [drp] via OPHTHALMIC

## 2014-12-13 MED ORDER — EPINEPHRINE HCL 1 MG/ML IJ SOLN
INTRAOCULAR | Status: DC | PRN
  Administered 2014-12-13: 500 mL

## 2014-12-13 MED ORDER — MIDAZOLAM HCL 2 MG/2ML IJ SOLN
INTRAMUSCULAR | Status: AC
  Filled 2014-12-13: qty 2

## 2014-12-13 MED ORDER — MIDAZOLAM HCL 2 MG/2ML IJ SOLN
1.0000 mg | INTRAMUSCULAR | Status: DC | PRN
Start: 2014-12-13 — End: 2014-12-13
  Administered 2014-12-13: 2 mg via INTRAVENOUS

## 2014-12-13 MED ORDER — LACTATED RINGERS IV SOLN
INTRAVENOUS | Status: DC
  Administered 2014-12-13: 07:00:00 via INTRAVENOUS

## 2014-12-13 MED ORDER — CYCLOPENTOLATE-PHENYLEPHRINE 0.2-1 % OP SOLN
1.0000 [drp] | OPHTHALMIC | Status: AC
  Administered 2014-12-13 (×3): 1 [drp] via OPHTHALMIC

## 2014-12-13 MED ORDER — BSS IO SOLN
INTRAOCULAR | Status: DC | PRN
  Administered 2014-12-13: 15 mL

## 2014-12-13 SURGICAL SUPPLY — 9 items
CLOTH BEACON ORANGE TIMEOUT ST (SAFETY) ×2 IMPLANT
EYE SHIELD UNIVERSAL CLEAR (GAUZE/BANDAGES/DRESSINGS) ×2 IMPLANT
GLOVE BIO SURGEON STRL SZ 6.5 (GLOVE) ×2 IMPLANT
GLOVE EXAM NITRILE MD LF STRL (GLOVE) ×2 IMPLANT
LENS ALC ACRYL/TECN (Ophthalmic Related) ×2 IMPLANT
PAD ARMBOARD 7.5X6 YLW CONV (MISCELLANEOUS) ×2 IMPLANT
TAPE SURG TRANSPORE 1 IN (GAUZE/BANDAGES/DRESSINGS) ×1 IMPLANT
TAPE SURGICAL TRANSPORE 1 IN (GAUZE/BANDAGES/DRESSINGS) ×1
WATER STERILE IRR 250ML POUR (IV SOLUTION) ×2 IMPLANT

## 2014-12-13 NOTE — Op Note (Signed)
Patient brought to the operating room and prepped and draped in the usual manner.  Lid speculum inserted in left eye.  Stab incision made at the twelve o'clock position.  Provisc instilled in the anterior chamber.   A 2.4 mm. Stab incision was made temporally.  An anterior capsulotomy was done with a bent 25 gauge needle.  The nucleus was hydrodissected.  The Phaco tip was inserted in the anterior chamber and the nucleus was emulsified.  CDE was 6.59.  The cortical material was then removed with the I and A tip.  Posterior capsule was the polished.  The anterior chamber was deepened with Provisc.  A 25.0 Diopter Alcon [id number] IOL was then inserted in the capsular bag.  Provisc was then removed with the I and A tip.  The wound was then hydrated.  Patient sent to the Recovery Room in good condition with follow up in my office.  Preoperative Diagnosis:  Nuclear Cataract OS Postoperative Diagnosis:  Same Procedure name: Kelman Phacoemulsification OS with IOL

## 2014-12-13 NOTE — Anesthesia Procedure Notes (Signed)
Procedure Name: MAC Date/Time: 12/13/2014 7:47 AM Performed by: Vista Deck Pre-anesthesia Checklist: Patient identified, Emergency Drugs available, Suction available, Timeout performed and Patient being monitored Patient Re-evaluated:Patient Re-evaluated prior to inductionOxygen Delivery Method: Nasal Cannula

## 2014-12-13 NOTE — Discharge Instructions (Signed)
Denise Freeman  12/13/2014           [facility] Instructions [place] [id number]      1. Avoid closing eyes tightly. One often closes the eye tightly when laughing, talking, sneezing, coughing or if they feel irritated. At these times, you should be careful not to close your eyes tightly.  2. Instill eye drops as instructed. To instill drops in your eye, open it, look up and have someone gently pull the lower lid down and instill a couple of drops inside the lower lid.  3. Do not touch upper lid.  4. Take Advil or Tylenol for pain.  5. You may use either eye for near work, such as reading or sewing and you may watch television.  6. You may have your hair done at the beauty parlor at any time.  7. Wear dark glasses with or without your own glasses if you are in bright light.  8. Call our office at [phone] or [phone] if you have sharp pain in your eye or unusual symptoms.  9. Do not be concerned because vision in the operative eye is not good. It will not be good, no matter how successful the operation, until you get a special lens for it. Your old glasses will not be suited to the new eye that was operated on and you will not be ready for a new lens for about a month.  10. Follow up at the [facility] office.    I have received a copy of the above instructions and will follow them.    Cataract Surgery Care After Refer to this sheet in the next few weeks. These instructions provide you with information on caring for yourself after your procedure. Your caregiver may also give you more specific instructions. Your treatment has been planned according to current medical practices, but problems sometimes occur. Call your caregiver if you have any problems or questions after your procedure.  HOME CARE INSTRUCTIONS   Avoid strenuous activities as directed by your caregiver.  Ask your caregiver when you can resume driving.  Use  eyedrops or other medicines to help healing and control pressure inside your eye as directed by your caregiver.  Only take over-the-counter or prescription medicines for pain, discomfort, or fever as directed by your caregiver.  Do not to touch or rub your eyes.  You may be instructed to use a protective shield during the first few days and nights after surgery. If not, wear sunglasses to protect your eyes. This is to protect the eye from pressure or from being accidentally bumped.  Keep the area around your eye clean and dry. Avoid swimming or allowing water to hit you directly in the face while showering. Keep soap and shampoo out of your eyes.  Do not bend or lift heavy objects. Bending increases pressure in the eye. You can walk, climb stairs, and do light household chores.  Do not put a contact lens into the eye that had surgery until your caregiver says it is okay to do so.  Ask your doctor when you can return to work. This will depend on the kind of work that you do. If you work in a dusty environment, you may be advised to wear protective eyewear for a period of time.  Ask your caregiver when it will be safe to engage in sexual activity.  Continue with your regular eye exams as directed by your caregiver. What to expect:  It is normal to feel itching and mild discomfort for a few days after cataract surgery. Some fluid discharge is also common, and your eye may be sensitive to light and touch.  After 1 to 2 days, even moderate discomfort should disappear. In most cases, healing will take about 6 weeks.  If you received an intraocular lens (IOL), you may notice that colors are very bright or have a blue tinge. Also, if you have been in bright sunlight, everything may appear reddish for a few hours. If you see these color tinges, it is because your lens is clear and no longer cloudy. Within a few months after receiving an IOL, these extra colors should go away. When you have healed,  you will probably need new glasses. SEEK MEDICAL CARE IF:   You have increased bruising around your eye.  You have discomfort not helped by medicine. SEEK IMMEDIATE MEDICAL CARE IF:   You have a fever.  You have a worsening or sudden vision loss.  You have redness, swelling, or increasing pain in the eye.  You have a thick discharge from the eye that had surgery. MAKE SURE YOU:  Understand these instructions.  Will watch your condition.  Will get help right away if you are not doing well or get worse. Document Released: 12/07/2004 Document Revised: 08/12/2011 Document Reviewed: 01/11/2011 [facility] Patient Information 2015 [place], [place]. This information is not intended to replace advice given to you by your health care provider. Make sure you discuss any questions you have with your health care provider.

## 2014-12-13 NOTE — Anesthesia Postprocedure Evaluation (Signed)
  Anesthesia Post-op Note  Patient: Denise Freeman  Procedure(s) Performed: Procedure(s) (LRB): CATARACT EXTRACTION PHACO AND INTRAOCULAR LENS PLACEMENT (IOC) (Left)  Patient Location:  Short Stay  Anesthesia Type: MAC  Level of Consciousness: awake  Airway and Oxygen Therapy: Patient Spontanous Breathing  Post-op Pain: none  Post-op Assessment: Post-op Vital signs reviewed, [name] Cardiovascular Status Stable, Respiratory Function Stable, Patent Airway, No signs of Nausea or vomiting and Pain level controlled  Post-op Vital Signs: Reviewed and stable  Complications: No apparent anesthesia complications

## 2014-12-13 NOTE — Transfer of Care (Signed)
Immediate Anesthesia Transfer of Care Note  Patient: Denise Freeman  Procedure(s) Performed: Procedure(s) (LRB): CATARACT EXTRACTION PHACO AND INTRAOCULAR LENS PLACEMENT (IOC) (Left)  Patient Location: Shortstay  Anesthesia Type: MAC  Level of Consciousness: awake  Airway & Oxygen Therapy: Patient Spontanous Breathing   Post-op Assessment: Report given to PACU RN, Post -op Vital signs reviewed and stable and Patient moving all extremities  Post vital signs: Reviewed and stable  Complications: No apparent anesthesia complications

## 2014-12-13 NOTE — Anesthesia Preprocedure Evaluation (Signed)
Anesthesia Evaluation  Patient identified by MRN, date of birth, ID band Patient awake    Reviewed: Allergy & Precautions, NPO status , [name] Chart, lab work & pertinent test results  History of Anesthesia Complications (+) Family history of anesthesia reaction and history of anesthetic complications (Family hx of MH, pt was tested negative.)  Airway Mallampati: IV  TM Distance: <3 FB     Dental  (+) Edentulous Upper, Edentulous Lower   Pulmonary shortness of breath, asthma , sleep apnea , COPDformer smoker,  breath sounds clear to auscultation        Cardiovascular + CAD, + Past MI and +CHF Rhythm:Regular Rate:Normal     Neuro/Psych PSYCHIATRIC DISORDERS Bipolar Disorder    GI/Hepatic hiatal hernia, GERD-  ,  Endo/Other  diabetes, Poorly Controlled  Renal/GU Renal disease     Musculoskeletal  (+) Fibromyalgia -  Abdominal   Peds  Hematology  (+) Blood dyscrasia, anemia ,   Anesthesia Other Findings   Reproductive/Obstetrics                             Anesthesia Physical Anesthesia Plan  ASA: IV  Anesthesia Plan: MAC   Post-op Pain Management:    Induction: Intravenous  Airway Management Planned: Nasal Cannula  Additional Equipment:   Intra-op Plan:   Post-operative Plan:   Informed Consent: I have reviewed the patients History and Physical, chart, labs and discussed the procedure including the risks, benefits and alternatives for the proposed anesthesia with the patient or authorized representative who has indicated his/her understanding and acceptance.     Plan Discussed with:   Anesthesia Plan Comments:         Anesthesia Quick Evaluation

## 2014-12-13 NOTE — H&P (Signed)
The patient was re examined and there is no change in the patients condition since the original H and P. 

## 2014-12-14 ENCOUNTER — Encounter (HOSPITAL_COMMUNITY): Payer: Self-pay | Admitting: Ophthalmology

## 2014-12-16 DIAGNOSIS — F319 Bipolar disorder, unspecified: Secondary | ICD-10-CM | POA: Diagnosis not present

## 2014-12-19 ENCOUNTER — Telehealth: Payer: Self-pay | Admitting: Internal Medicine

## 2014-12-19 MED ORDER — FUROSEMIDE 40 MG PO TABS: 60.0000 mg | ORAL_TABLET | Freq: Two times a day (BID) | ORAL | Status: DC

## 2014-12-19 NOTE — Telephone Encounter (Signed)
°  1. Which medications need to be refilled? Furosemide   2. Which pharmacy is medication to be sent to?[facility] in [place]   3. Do they need a 30 day or 90 day supply? 90  4. Would they like a call back once the medication has been sent to the pharmacy? Yes, she is completely out

## 2014-12-19 NOTE — Telephone Encounter (Signed)
Pt states a few days ago "chest just felt funny". Denied it being "chest pain". Cannot determine if this is a problem of pressure, palpitations, etc. Her explanation is very unclear, vague, lacking details despite my repeated inquiry. She quickly began talking w/o interruption for 10 minutes about an assortment of other complaints including blood sugar problems and complaint of a recent clinical or hospital visit she had.  At length was able to advise pt if worse to go to ED for symptoms. Pt voiced understanding.  Med refilled per her request - advised to call if any further concerns or questions.

## 2014-12-19 NOTE — Telephone Encounter (Signed)
Pt was also complaining of spasms in her chest all weekend.

## 2014-12-20 ENCOUNTER — Telehealth: Payer: Self-pay | Admitting: Internal Medicine

## 2014-12-20 NOTE — Telephone Encounter (Signed)
Spoke with pharmacy staff. Patient is there and states her lasix Rx is supposed to be 2 tablets PO BID. Patient has been on 1.5 tablets BID since May 2016 (when med was increased). Patient told pharmacy staff she is retaining fluids. Pharmacy staff told patient she will need to contact office regarding this issue.

## 2014-12-30 ENCOUNTER — Other Ambulatory Visit: Payer: Self-pay | Admitting: Internal Medicine

## 2014-12-30 ENCOUNTER — Other Ambulatory Visit: Payer: Self-pay | Admitting: *Deleted

## 2014-12-30 MED ORDER — INSULIN LISPRO 100 UNIT/ML (KWIKPEN): 22.0000 [IU] | PEN_INJECTOR | Freq: Three times a day (TID) | SUBCUTANEOUS | Status: DC

## 2014-12-30 MED ORDER — INSULIN GLARGINE 300 UNIT/ML ~~LOC~~ SOPN
75.0000 [IU] | PEN_INJECTOR | Freq: Every day | SUBCUTANEOUS | Status: DC
Start: 2014-12-30 — End: 2015-01-26

## 2015-01-05 DIAGNOSIS — D649 Anemia, unspecified: Secondary | ICD-10-CM | POA: Diagnosis not present

## 2015-01-05 DIAGNOSIS — M797 Fibromyalgia: Secondary | ICD-10-CM | POA: Diagnosis not present

## 2015-01-05 DIAGNOSIS — I509 Heart failure, unspecified: Secondary | ICD-10-CM | POA: Diagnosis not present

## 2015-01-05 DIAGNOSIS — L989 Disorder of the skin and subcutaneous tissue, unspecified: Secondary | ICD-10-CM | POA: Diagnosis not present

## 2015-01-05 DIAGNOSIS — R251 Tremor, unspecified: Secondary | ICD-10-CM | POA: Diagnosis not present

## 2015-01-05 DIAGNOSIS — J449 Chronic obstructive pulmonary disease, unspecified: Secondary | ICD-10-CM | POA: Diagnosis not present

## 2015-01-09 DIAGNOSIS — M25552 Pain in left hip: Secondary | ICD-10-CM | POA: Diagnosis not present

## 2015-01-09 DIAGNOSIS — M1731 Unilateral post-traumatic osteoarthritis, right knee: Secondary | ICD-10-CM | POA: Diagnosis not present

## 2015-01-09 DIAGNOSIS — Z79891 Long term (current) use of opiate analgesic: Secondary | ICD-10-CM | POA: Diagnosis not present

## 2015-01-09 DIAGNOSIS — M545 Low back pain: Secondary | ICD-10-CM | POA: Diagnosis not present

## 2015-01-09 DIAGNOSIS — G541 Lumbosacral plexus disorders: Secondary | ICD-10-CM | POA: Diagnosis not present

## 2015-01-09 DIAGNOSIS — M25551 Pain in right hip: Secondary | ICD-10-CM | POA: Diagnosis not present

## 2015-01-09 DIAGNOSIS — M5400 Panniculitis affecting regions of neck and back, site unspecified: Secondary | ICD-10-CM | POA: Diagnosis not present

## 2015-01-09 DIAGNOSIS — M25562 Pain in left knee: Secondary | ICD-10-CM | POA: Diagnosis not present

## 2015-01-09 DIAGNOSIS — M542 Cervicalgia: Secondary | ICD-10-CM | POA: Diagnosis not present

## 2015-01-09 DIAGNOSIS — M25512 Pain in left shoulder: Secondary | ICD-10-CM | POA: Diagnosis not present

## 2015-01-09 DIAGNOSIS — G603 Idiopathic progressive neuropathy: Secondary | ICD-10-CM | POA: Diagnosis not present

## 2015-01-09 DIAGNOSIS — G8929 Other chronic pain: Secondary | ICD-10-CM | POA: Diagnosis not present

## 2015-01-09 DIAGNOSIS — M25561 Pain in right knee: Secondary | ICD-10-CM | POA: Diagnosis not present

## 2015-01-16 DIAGNOSIS — L280 Lichen simplex chronicus: Secondary | ICD-10-CM | POA: Diagnosis not present

## 2015-01-16 DIAGNOSIS — L570 Actinic keratosis: Secondary | ICD-10-CM | POA: Diagnosis not present

## 2015-01-16 DIAGNOSIS — L859 Epidermal thickening, unspecified: Secondary | ICD-10-CM | POA: Diagnosis not present

## 2015-01-26 ENCOUNTER — Other Ambulatory Visit (INDEPENDENT_AMBULATORY_CARE_PROVIDER_SITE_OTHER): Payer: Medicare Other | Admitting: *Deleted

## 2015-01-26 ENCOUNTER — Ambulatory Visit (INDEPENDENT_AMBULATORY_CARE_PROVIDER_SITE_OTHER): Payer: Medicare Other | Admitting: Internal Medicine

## 2015-01-26 ENCOUNTER — Encounter: Payer: Self-pay | Admitting: Internal Medicine

## 2015-01-26 VITALS — BP 122/68 | HR 107 | Temp 98.8°F | Resp 12 | Wt 226.0 lb

## 2015-01-26 DIAGNOSIS — E1322 Other specified diabetes mellitus with diabetic chronic kidney disease: Secondary | ICD-10-CM

## 2015-01-26 DIAGNOSIS — N183 Chronic kidney disease, stage 3 unspecified: Secondary | ICD-10-CM

## 2015-01-26 DIAGNOSIS — IMO0002 Reserved for concepts with insufficient information to code with codable children: Secondary | ICD-10-CM

## 2015-01-26 DIAGNOSIS — E1365 Other specified diabetes mellitus with hyperglycemia: Principal | ICD-10-CM

## 2015-01-26 DIAGNOSIS — E1329 Other specified diabetes mellitus with other diabetic kidney complication: Secondary | ICD-10-CM | POA: Diagnosis not present

## 2015-01-26 LAB — POCT GLYCOSYLATED HEMOGLOBIN (HGB A1C): HEMOGLOBIN A1C: 11.7

## 2015-01-26 MED ORDER — DULAGLUTIDE 0.75 MG/0.5ML ~~LOC~~ SOAJ: SUBCUTANEOUS | Status: DC

## 2015-01-26 MED ORDER — INSULIN LISPRO 100 UNIT/ML (KWIKPEN): 22.0000 [IU] | PEN_INJECTOR | Freq: Three times a day (TID) | SUBCUTANEOUS | Status: DC

## 2015-01-26 MED ORDER — INSULIN GLARGINE 300 UNIT/ML ~~LOC~~ SOPN: 80.0000 [IU] | PEN_INJECTOR | Freq: Every day | SUBCUTANEOUS | Status: DC

## 2015-01-26 NOTE — Progress Notes (Signed)
Patient ID: Denise Freeman, female   DOB: 06-15-1950, 64 y.o.   MRN: [id number]  HPI: Denise Freeman is a 64 y.o.-year-old female, initially referred by her PCP, Dr. Loman Chroman, for management of DM2, dx in 2009, insulin-dependent in 2013, uncontrolled, with complications (CKD, DR). Last visit 1.5 mo ago.   She was seen by the neurologist for tremors >> believed to be from Lyrica + Neurontin >> stopped Lyrica 3 weeks ago.  Last hemoglobin A1c was: Lab Results  Component Value Date   HGBA1C 8.3* 09/13/2014   HGBA1C 7.6* 05/13/2014   12/30/2013: HbA1c 8.6% 12/28/2013: HbA1c 7.9% HbA1c 8.7% HbA1c 9.4% HbA1c 7.3%  Pt is on a regimen of: - Toujeo 75 units at bedtime. - Humalog to: 18 units before B >> 20 22 units before L  24 units before D >> 28 - sliding scale of Humalog - 150-175: + 1 unit  - 176-200: + 2 units  - 201-225: + 3 units  - 226-250: + 4 units  - 251-275: + 5 units - >275: + 6 units We stopped Januvia >> did not help She was on Metformin >> nausea.  Pt checks her sugars 4x a day and they are better (no log): - am: 149-170 >> 158-191 >> 160-175, 180 >> 173-189 >> 160s >> 149-157 - 2h after b'fast: n/c - before lunch: 158-199 >> 170-199 >> 130-150 >> 167-191 >> 195-210 >> 160-183 - 2h after lunch: n/c - before dinner: 169-205, 225 >> 168-200 >> 175-190  >> 179-200 >> 140s (if no snack), 165-170 >> 175-187 - 2h after dinner: n/c - bedtime: 154-199 >> 162-200, last week 210-235 >> 165-190 >> 169-238 >> 225-250 >> 180-209, 225 - nighttime:n/c No lows. Lowest sugar was 130 >> 167 >> 140s; she has hypoglycemia awareness at 70 at 120. Highest sugar was 240 >> 190 >> 200s >> 500 x1, otw 250s.   Glucometer: FreeStyle  Pt's meals are: - Breakfast: toast with spread or oatmeal with splenda - Lunch: cottage cheese, or fruit - Dinner: chicken/beef/turkey with vegetables and a starch; macaroni and cheese - Snacks: 2 snacks   She tells me she eats between meals - almost  incessantly.  - + CKD stage 3, last BUN/creatinine:  Lab Results  Component Value Date   BUN 29* 11/24/2014   CREATININE 0.98 11/24/2014  On Lisinopril, fenofibrate. - last set of lipids: 09/07/2013: 180/135/31/138 On Crestor. - last eye exam was in 10/2014. 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 She had cataract sx in 05 and 11/2014. - no numbness and tingling in her feet.  ROS: Constitutional: + weight loss, + fatigue, + poor sleep, + nocturia Eyes: + blurry vision, no xerophthalmia ENT: no sore throat, no nodules palpated in throat, no dysphagia/odynophagia, no hoarseness, + hypoacusis, + tinnitus Cardiovascular: + CP/+ SOB/+ palpitations/+ leg swelling Respiratory: no cough/+ SOB/+ wheezing Gastrointestinal: + N/no V/+ D/no C, + heartburn Musculoskeletal: + muscle aches/+ joint aches Skin: no rashes, + easy bruising, + itching Neurological: no tremors/numbness/tingling/dizziness, + HA  I reviewed pt's medications, allergies, PMH, social hx, family hx, and changes were documented in the history of present illness. Otherwise, unchanged from my initial visit note:  Past Medical History  Diagnosis Date  . Diabetes mellitus without complication   . Bipolar 1 disorder   . Fibromyalgia   . COPD (chronic obstructive pulmonary disease)   . CHF (congestive heart failure)   . Asthma   . Atrial fibrillation   . Neuropathy     Disc Back   .  Fibromyalgia   . CHF (congestive heart failure)   . Coronary artery disease   . Anemia   . History of hiatal hernia   . GERD (gastroesophageal reflux disease)   . Shortness of breath dyspnea   . Family history of adverse reaction to anesthesia     Uncle was positive for malignant hyerthermia; patient had testing done and was negative.  . Myocardial infarction 1990  . Sleep apnea     uses biPAP, 10  . Arthritis   . HOH (hard of hearing)    Past Surgical History  Procedure Laterality Date  . Heel spur surgery Bilateral   . Appendectomy    . Bladder suspension       2003, 2006 and 2010  . Neck surgery N/A 2009    4, 6, and 7 cervical disc replaced  . Bladder stimulator      pt states, "it cannot be turned off".  . Hernia repair      umbilical  . Cataract extraction w/phaco Right 11/29/2014    Procedure: CATARACT EXTRACTION PHACO AND INTRAOCULAR LENS PLACEMENT (IOC);  Surgeon: Rutherford Guys, MD;  Location: [facility];  Service: Ophthalmology;  Laterality: Right;  CDE:3.81  . Cataract extraction w/phaco Left 12/13/2014    Procedure: CATARACT EXTRACTION PHACO AND INTRAOCULAR LENS PLACEMENT (IOC);  Surgeon: Rutherford Guys, MD;  Location: [facility];  Service: Ophthalmology;  Laterality: Left;  CDE:6.59   History   Social History  . Marital Status: Married    Spouse Name: N/A    Number of Children: 1   Occupational History  . disabled   Social History Main Topics  . Smoking status: Former Smoker -- 2.00 packs/day for 14 years    Types: Cigarettes    Quit date: 04/12/1986  . Smokeless tobacco: Not on file  . Alcohol Use: No  . Drug Use: No   Current Outpatient Prescriptions on File Prior to Visit  Medication Sig Dispense Refill  . albuterol (PROVENTIL) (2.5 MG/3ML) 0.083% nebulizer solution Take 2.5 mg by nebulization every 6 (six) hours as needed for wheezing or shortness of breath.    Marland Kitchen atorvastatin (LIPITOR) 20 MG tablet Take 1 tablet (20 mg total) by mouth daily. 30 tablet 1  . budesonide (PULMICORT) 0.5 MG/2ML nebulizer solution Take 2 mLs (0.5 mg total) by nebulization 2 (two) times daily. DX J43.8 120 mL 6  . cetirizine (ZYRTEC) 10 MG tablet Take 10 mg by mouth daily.    Marland Kitchen diltiazem (CARDIZEM) 30 MG tablet Take 1 tablet (30 mg total) by mouth 2 (two) times daily. 180 tablet 1  . diphenhydrAMINE (BENADRYL) 25 mg capsule Take 25 mg by mouth every 6 (six) hours as needed.    . docusate sodium (COLACE) 100 MG capsule Take 100 mg by mouth daily.    Marland Kitchen escitalopram (LEXAPRO) 10 MG tablet Take 10 mg by mouth at bedtime.     . famotidine (PEPCID) 40 MG  tablet Take 20 mg by mouth 2 (two) times daily.     . fenofibrate 54 MG tablet Take 1 tablet (54 mg total) by mouth daily. 90 tablet 1  . fluticasone (FLONASE) 50 MCG/ACT nasal spray Place 1 spray into both nostrils 2 (two) times daily.    . furosemide (LASIX) 40 MG tablet Take 1.5 tablets (60 mg total) by mouth 2 (two) times daily. 270 tablet 3  . Insulin Glargine (TOUJEO SOLOSTAR) 300 UNIT/ML SOPN Inject 75 Units into the skin at bedtime. 9 pen 1  . insulin  lispro (HUMALOG KWIKPEN) 100 UNIT/ML KiwkPen Inject 0.22-0.28 mLs (22-28 Units total) into the skin 3 (three) times daily. 20 UNITS TWICE DAILY AND 28 UNITS IN THE EVENING 10 pen 1  . Insulin Pen Needle (CAREFINE PEN NEEDLES) 32G X 4 MM MISC Use 4x a day 200 each 3  . ipratropium (ATROVENT) 0.02 % nebulizer solution Take 0.5 mg by nebulization 2 (two) times daily.    . iron polysaccharides (NIFEREX) 150 MG capsule Take 1 capsule (150 mg total) by mouth 2 (two) times daily. 60 capsule 0  . metolazone (ZAROXOLYN) 2.5 MG tablet Take 1 tablet (2.5 mg total) by mouth daily. 90 tablet 1  . mirtazapine (REMERON) 15 MG tablet Take 15 mg by mouth at bedtime.    . modafinil (PROVIGIL) 200 MG tablet Take 200 mg by mouth daily.    Marland Kitchen nystatin (MYCOSTATIN/NYSTOP) 100000 UNIT/GM POWD Apply topically 2 (two) times daily. USES UNDERNEATH BREAST    . ondansetron (ZOFRAN-ODT) 4 MG disintegrating tablet Take 4 mg by mouth every 8 (eight) hours as needed for nausea or vomiting.    Marland Kitchen oxyCODONE (OXY IR/ROXICODONE) 5 MG immediate release tablet Take 5 mg by mouth 2 (two) times daily.    . OXYGEN Inhale into the lungs continuous. 3 liters 24/7    . pantoprazole (PROTONIX) 40 MG tablet Take 1 tablet (40 mg total) by mouth daily. 30 tablet 0  . potassium chloride SA (K-DUR,KLOR-CON) 20 MEQ tablet Take 20 mEq by mouth 2 (two) times daily.     . rivaroxaban (XARELTO) 20 MG TABS tablet Take 1 tablet (20 mg total) by mouth daily with supper. 90 tablet 1   No current  facility-administered medications on file prior to visit.   Allergies  Allergen Reactions  . Ancef [Cefazolin] Nausea And Vomiting  . Augmentin [Amoxicillin-Pot Clavulanate] Nausea Only  . Ciprofloxacin Nausea And Vomiting  . Haldol [Haloperidol] Other (See Comments)    Restless leg  . Levaquin [Levofloxacin In D5w] Other (See Comments)    "afib"  . Nsaids Diarrhea  . Tamiflu [Oseltamivir Phosphate] Other (See Comments)    "water blisters"  . Zoloft [Sertraline Hcl] Other (See Comments)    Jaw problems   Family History  Problem Relation Age of Onset  . Heart disease Mother   . COPD Mother   . Cancer Mother     Breast cancer  . Diabetes Mother   . Heart disease Father   . COPD Sister   . Heart disease Sister   . Diabetes Sister   . Heart disease Maternal Grandmother    PE: BP 122/68 mmHg  Pulse 107  Temp(Src) 98.8 F (37.1 C) (Oral)  Resp 12  Wt 226 lb (102.513 kg)  SpO2 97% Body mass index is 45.62 kg/(m^2). Wt Readings from Last 3 Encounters:  01/26/15 226 lb (102.513 kg)  12/01/14 227 lb 8 oz (103.193 kg)  11/24/14 227 lb (102.967 kg)   Constitutional: obese, in wheelchair, in NAD, on oxygen Eyes: PERRLA, EOMI, no exophthalmos ENT: moist mucous membranes, no thyromegaly, no cervical lymphadenopathy Cardiovascular: irreg. Irreg. , No MRG Respiratory: CTA B Gastrointestinal: abdomen soft, NT, ND, BS+ Musculoskeletal: no deformities, strength intact in all 4 Skin: moist, warm Neurological: + tremor with outstretched hands, DTR normal in all 4  ASSESSMENT: 1. DM2, insulin-dependent, uncontrolled, with complications - CKD stage 3 - DR  PLAN:  1. Patient with long-standing, uncontrolled diabetes (with last HbA1c not far from goal), on basal-bolus insulin regimen - with still high  sugars throughout the day, but some of them are better - per detailed log. - check HbA1c today >> 11.7% (very high!)  - we repeated the level  - same! - I advised her to increase the  insulin and add Trulicity - we can add Invokana or Jardiance if needed:  Patient Instructions  Please increase the insulin: - Toujeo to 80 units at bedtime Increase: - Humalog to: 25 units before B 27 units before L  32 units before D - sliding scale of Humalog - 150-175: + 1 unit  - 176-200: + 2 units  - 201-225: + 3 units  - 226-250: + 4 units  - 251-275: + 5 units - >275: + 6 units  Please start Trulicity [id number] mg under skin weekly.   Please return in 1.5 months with your sugar log.   - Strongly advised her to continue checking sugars at different times of the day - check 4 times a day, rotating checks  - advised for yearly eye exams >> UTD - Return to clinic in 1.5 mo with sugar log

## 2015-01-26 NOTE — Patient Instructions (Addendum)
Please increase the insulin: - Toujeo to 80 units at bedtime Increase: - Humalog to: 25 units before B 27 units before L  32 units before D - sliding scale of Humalog - 150-175: + 1 unit  - 176-200: + 2 units  - 201-225: + 3 units  - 226-250: + 4 units  - 251-275: + 5 units - >275: + 6 units  Please start Trulicity [id number] mg under skin weekly.   Please return in 1.5 months with your sugar log.

## 2015-02-02 DIAGNOSIS — G8929 Other chronic pain: Secondary | ICD-10-CM | POA: Diagnosis not present

## 2015-02-02 DIAGNOSIS — Z79891 Long term (current) use of opiate analgesic: Secondary | ICD-10-CM | POA: Diagnosis not present

## 2015-02-02 DIAGNOSIS — M79604 Pain in right leg: Secondary | ICD-10-CM | POA: Diagnosis not present

## 2015-02-02 DIAGNOSIS — M542 Cervicalgia: Secondary | ICD-10-CM | POA: Diagnosis not present

## 2015-02-02 DIAGNOSIS — M25551 Pain in right hip: Secondary | ICD-10-CM | POA: Diagnosis not present

## 2015-02-02 DIAGNOSIS — M25512 Pain in left shoulder: Secondary | ICD-10-CM | POA: Diagnosis not present

## 2015-02-02 DIAGNOSIS — M545 Low back pain: Secondary | ICD-10-CM | POA: Diagnosis not present

## 2015-02-02 DIAGNOSIS — M79605 Pain in left leg: Secondary | ICD-10-CM | POA: Diagnosis not present

## 2015-02-08 DIAGNOSIS — H9202 Otalgia, left ear: Secondary | ICD-10-CM | POA: Diagnosis not present

## 2015-02-08 DIAGNOSIS — Z23 Encounter for immunization: Secondary | ICD-10-CM | POA: Diagnosis not present

## 2015-02-08 DIAGNOSIS — R251 Tremor, unspecified: Secondary | ICD-10-CM | POA: Diagnosis not present

## 2015-02-09 ENCOUNTER — Telehealth: Payer: Self-pay | Admitting: Internal Medicine

## 2015-02-09 MED ORDER — RIVAROXABAN 20 MG PO TABS: 20.0000 mg | ORAL_TABLET | Freq: Every day | ORAL | Status: DC

## 2015-02-09 NOTE — Telephone Encounter (Signed)
Pt is calling in stating that her prescription for Xarelto needs to be written for 90 day supply at the [facility]. Please correct this (if it can) and call to notify the pt.   Thanks

## 2015-02-09 NOTE — Telephone Encounter (Signed)
Called patient to let him know that his Xarelto was called in to [facility]

## 2015-02-16 ENCOUNTER — Telehealth: Payer: Self-pay | Admitting: Internal Medicine

## 2015-02-16 NOTE — Telephone Encounter (Signed)
Patient stated she can't see any difference in  medication Trulicity,  B/S is still running high, please advise

## 2015-02-16 NOTE — Telephone Encounter (Signed)
Called pt and lvm advising her to call back with what time her blood sugars are running high and the #.

## 2015-02-17 NOTE — Telephone Encounter (Signed)
Can this encounter be closed?

## 2015-02-20 ENCOUNTER — Emergency Department (HOSPITAL_COMMUNITY)
Admission: EM | Admit: 2015-02-20 | Discharge: 2015-02-20 | Disposition: A | Discharge status: Home / Self Care | Payer: Medicare Other | Attending: Emergency Medicine | Admitting: Emergency Medicine

## 2015-02-20 ENCOUNTER — Encounter (HOSPITAL_COMMUNITY): Payer: Self-pay

## 2015-02-20 ENCOUNTER — Emergency Department (HOSPITAL_COMMUNITY): Payer: Medicare Other

## 2015-02-20 DIAGNOSIS — Z8739 Personal history of other diseases of the musculoskeletal system and connective tissue: Secondary | ICD-10-CM | POA: Diagnosis not present

## 2015-02-20 DIAGNOSIS — R509 Fever, unspecified: Secondary | ICD-10-CM | POA: Diagnosis not present

## 2015-02-20 DIAGNOSIS — H9190 Unspecified hearing loss, unspecified ear: Secondary | ICD-10-CM | POA: Insufficient documentation

## 2015-02-20 DIAGNOSIS — I252 Old myocardial infarction: Secondary | ICD-10-CM | POA: Insufficient documentation

## 2015-02-20 DIAGNOSIS — Z794 Long term (current) use of insulin: Secondary | ICD-10-CM | POA: Insufficient documentation

## 2015-02-20 DIAGNOSIS — L03211 Cellulitis of face: Secondary | ICD-10-CM | POA: Diagnosis not present

## 2015-02-20 DIAGNOSIS — R000 Tachycardia, unspecified: Secondary | ICD-10-CM | POA: Insufficient documentation

## 2015-02-20 DIAGNOSIS — R0902 Hypoxemia: Secondary | ICD-10-CM | POA: Diagnosis not present

## 2015-02-20 DIAGNOSIS — R52 Pain, unspecified: Secondary | ICD-10-CM | POA: Diagnosis present

## 2015-02-20 DIAGNOSIS — Z79899 Other long term (current) drug therapy: Secondary | ICD-10-CM | POA: Insufficient documentation

## 2015-02-20 DIAGNOSIS — I2510 Atherosclerotic heart disease of native coronary artery without angina pectoris: Secondary | ICD-10-CM | POA: Insufficient documentation

## 2015-02-20 DIAGNOSIS — G4730 Sleep apnea, unspecified: Secondary | ICD-10-CM | POA: Diagnosis not present

## 2015-02-20 DIAGNOSIS — Z87891 Personal history of nicotine dependence: Secondary | ICD-10-CM | POA: Diagnosis not present

## 2015-02-20 DIAGNOSIS — Z8669 Personal history of other diseases of the nervous system and sense organs: Secondary | ICD-10-CM | POA: Diagnosis not present

## 2015-02-20 DIAGNOSIS — R0602 Shortness of breath: Secondary | ICD-10-CM | POA: Diagnosis not present

## 2015-02-20 DIAGNOSIS — J441 Chronic obstructive pulmonary disease with (acute) exacerbation: Secondary | ICD-10-CM | POA: Insufficient documentation

## 2015-02-20 DIAGNOSIS — Z9981 Dependence on supplemental oxygen: Secondary | ICD-10-CM | POA: Insufficient documentation

## 2015-02-20 DIAGNOSIS — Z862 Personal history of diseases of the blood and blood-forming organs and certain disorders involving the immune mechanism: Secondary | ICD-10-CM | POA: Diagnosis not present

## 2015-02-20 DIAGNOSIS — F319 Bipolar disorder, unspecified: Secondary | ICD-10-CM | POA: Insufficient documentation

## 2015-02-20 DIAGNOSIS — R05 Cough: Secondary | ICD-10-CM | POA: Diagnosis not present

## 2015-02-20 DIAGNOSIS — K219 Gastro-esophageal reflux disease without esophagitis: Secondary | ICD-10-CM | POA: Insufficient documentation

## 2015-02-20 DIAGNOSIS — I509 Heart failure, unspecified: Secondary | ICD-10-CM | POA: Diagnosis not present

## 2015-02-20 DIAGNOSIS — E119 Type 2 diabetes mellitus without complications: Secondary | ICD-10-CM | POA: Insufficient documentation

## 2015-02-20 DIAGNOSIS — R011 Cardiac murmur, unspecified: Secondary | ICD-10-CM | POA: Diagnosis not present

## 2015-02-20 LAB — CBC WITH DIFFERENTIAL/PLATELET
BASOS ABS: 0 10*3/uL (ref 0.0–0.1)
BASOS PCT: 0 %
Eosinophils Absolute: 0.5 10*3/uL (ref 0.0–0.7)
Eosinophils Relative: 3 %
HEMATOCRIT: 36.2 % (ref 36.0–46.0)
HEMOGLOBIN: 11.3 g/dL — AB (ref 12.0–15.0)
LYMPHS PCT: 13 %
Lymphs Abs: 1.9 10*3/uL (ref 0.7–4.0)
MCH: 25.9 pg — ABNORMAL LOW (ref 26.0–34.0)
MCHC: 31.2 g/dL (ref 30.0–36.0)
MCV: 83 fL (ref 78.0–100.0)
MONO ABS: 0.7 10*3/uL (ref 0.1–1.0)
Monocytes Relative: 5 %
NEUTROS ABS: 11.9 10*3/uL — AB (ref 1.7–7.7)
NEUTROS PCT: 79 %
Platelets: 328 10*3/uL (ref 150–400)
RBC: 4.36 MIL/uL (ref 3.87–5.11)
RDW: 13.9 % (ref 11.5–15.5)
WBC: 15 10*3/uL — ABNORMAL HIGH (ref 4.0–10.5)

## 2015-02-20 LAB — COMPREHENSIVE METABOLIC PANEL
ALBUMIN: 3.7 g/dL (ref 3.5–5.0)
ALT: 9 U/L — ABNORMAL LOW (ref 14–54)
AST: 17 U/L (ref 15–41)
Alkaline Phosphatase: 98 U/L (ref 38–126)
Anion gap: 13 (ref 5–15)
BUN: 23 mg/dL — ABNORMAL HIGH (ref 6–20)
CO2: 44 mmol/L — AB (ref 22–32)
Calcium: 8.1 mg/dL — ABNORMAL LOW (ref 8.9–10.3)
Chloride: 80 mmol/L — ABNORMAL LOW (ref 101–111)
Creatinine, Ser: 1.11 mg/dL — ABNORMAL HIGH (ref 0.44–1.00)
GFR calc non Af Amer: 52 mL/min — ABNORMAL LOW (ref >60)
GFR, EST AFRICAN AMERICAN: 60 mL/min — AB (ref >60)
Glucose, Bld: 176 mg/dL — ABNORMAL HIGH (ref 65–99)
Potassium: 3.2 mmol/L — ABNORMAL LOW (ref 3.5–5.1)
SODIUM: 137 mmol/L (ref 135–145)
TOTAL PROTEIN: 8.4 g/dL — AB (ref 6.5–8.1)
Total Bilirubin: 0.5 mg/dL (ref 0.3–1.2)

## 2015-02-20 LAB — URINALYSIS, ROUTINE W REFLEX MICROSCOPIC
Bilirubin Urine: NEGATIVE
Glucose, UA: NEGATIVE mg/dL
Ketones, ur: NEGATIVE mg/dL
LEUKOCYTES UA: NEGATIVE
NITRITE: NEGATIVE
PH: 7 (ref 5.0–8.0)
Protein, ur: 30 mg/dL — AB
Specific Gravity, Urine: 1.01 (ref 1.005–1.030)
UROBILINOGEN UA: 1 mg/dL (ref 0.0–1.0)

## 2015-02-20 LAB — URINE MICROSCOPIC-ADD ON

## 2015-02-20 MED ORDER — DOXYCYCLINE HYCLATE 100 MG PO CAPS: 100.0000 mg | ORAL_CAPSULE | Freq: Two times a day (BID) | ORAL | Status: DC

## 2015-02-20 MED ORDER — DOXYCYCLINE HYCLATE 100 MG PO TABS
100.0000 mg | ORAL_TABLET | Freq: Once | ORAL | Status: AC
  Administered 2015-02-20: 100 mg via ORAL
  Filled 2015-02-20 (×2): qty 1

## 2015-02-20 NOTE — ED Notes (Signed)
Pt a-fib on monitor,

## 2015-02-20 NOTE — ED Notes (Signed)
Received report on pt, pt  Repositioned, c/o being hungry, per Dr Rogene Houston, pt may eat, frozen meal prepared for pt, pt c/o pain at a 7 on pain scale, states that it is her chronic pain that she deals with all the time,

## 2015-02-20 NOTE — ED Notes (Signed)
Dr Zackowski at bedside,  

## 2015-02-20 NOTE — ED Notes (Signed)
Pt c/o cough, sob, generalized body aches, headache, and inability to focus for past few days.  Reports was sent here by her pcp for evaluation for possible pneumonia.

## 2015-02-20 NOTE — ED Notes (Signed)
Pt refuses to keep her blood pressure cuff on and stay laying in the bed. Informed pt that she needed to keep the cuff on and stay in bed

## 2015-02-20 NOTE — Discharge Instructions (Signed)
Take anabolic as directed for the cellulitis on your left cheek. Important that she do follow up with your primary care doctor to make sure this heals properly. Continue usual breathing treatments. Today's chest x-ray was negative for pneumonia.

## 2015-02-20 NOTE — ED Notes (Addendum)
Pt repositioned, blanket provided, pt updated on plan of care,

## 2015-02-20 NOTE — ED Notes (Signed)
Pt spouse Jamieka Belflower here to transport pt home,

## 2015-02-20 NOTE — ED Notes (Signed)
Pt given dinner tray,  

## 2015-02-20 NOTE — ED Notes (Signed)
Pt is ready to go home, waiting for ride to come to pick her up,

## 2015-02-20 NOTE — ED Provider Notes (Signed)
CSN: [id number]     Arrival date & time 02/20/15  1744 History   First MD Initiated Contact with Patient 02/20/15 1755     Chief Complaint  Patient presents with  . Cough  . Generalized Body Aches     (Consider location/radiation/quality/duration/timing/severity/associated sxs/prior Treatment) Patient is a 64 y.o. female presenting with cough. The history is provided by the patient.  Cough Associated symptoms: fever, myalgias and shortness of breath   Associated symptoms: no chest pain and no headaches    patient sent in by her primary care doctor for concerns for pneumonia had low-grade fever at the office. Temp of 99.5 upon arrival here. Patient has a history of atrial fibrillation is on anticoagulation therapy. Patient has a history of COPD and is using albuterol and normally on 3 L of oxygen at all times. Patient has a complaint of a cough and shortness of breath and a rash to the right cheek area. Denies any abdominal pain or chest pain.  Past Medical History  Diagnosis Date  . Diabetes mellitus without complication   . Bipolar 1 disorder   . Fibromyalgia   . COPD (chronic obstructive pulmonary disease)   . CHF (congestive heart failure)   . Asthma   . Atrial fibrillation   . Neuropathy     Disc Back   . Fibromyalgia   . CHF (congestive heart failure)   . Coronary artery disease   . Anemia   . History of hiatal hernia   . GERD (gastroesophageal reflux disease)   . Shortness of breath dyspnea   . Family history of adverse reaction to anesthesia     Uncle was positive for malignant hyerthermia; patient had testing done and was negative.  . Myocardial infarction 1990  . Sleep apnea     uses biPAP, 10  . Arthritis   . HOH (hard of hearing)    Past Surgical History  Procedure Laterality Date  . Heel spur surgery Bilateral   . Appendectomy    . Bladder suspension      2003, 2006 and 2010  . Neck surgery N/A 2009    4, 6, and 7 cervical disc replaced  . Bladder  stimulator      pt states, "it cannot be turned off".  . Hernia repair      umbilical  . Cataract extraction w/phaco Right 11/29/2014    Procedure: CATARACT EXTRACTION PHACO AND INTRAOCULAR LENS PLACEMENT (IOC);  Surgeon: Rutherford Guys, MD;  Location: [facility];  Service: Ophthalmology;  Laterality: Right;  CDE:3.81  . Cataract extraction w/phaco Left 12/13/2014    Procedure: CATARACT EXTRACTION PHACO AND INTRAOCULAR LENS PLACEMENT (IOC);  Surgeon: Rutherford Guys, MD;  Location: [facility];  Service: Ophthalmology;  Laterality: Left;  CDE:6.59   Family History  Problem Relation Age of Onset  . Heart disease Mother   . COPD Mother   . Cancer Mother     Breast cancer  . Diabetes Mother   . Heart disease Father   . COPD Sister   . Heart disease Sister   . Diabetes Sister   . Heart disease Maternal Grandmother    Social History  Substance Use Topics  . Smoking status: Former Smoker -- 2.00 packs/day for 14 years    Types: Cigarettes    Quit date: 04/12/1989  . Smokeless tobacco: None  . Alcohol Use: No   OB History    No data available     Review of Systems  Constitutional: Positive for fever.  HENT: Negative for congestion.   Eyes: Negative for redness.  Respiratory: Positive for cough and shortness of breath.   Cardiovascular: Negative for chest pain.  Gastrointestinal: Negative for nausea, vomiting and abdominal pain.  Genitourinary: Negative for dysuria.  Musculoskeletal: Positive for myalgias. Negative for back pain and neck pain.  Skin: Positive for wound.  Neurological: Negative for headaches.  Hematological: Bruises/bleeds easily.  Psychiatric/Behavioral: Negative for confusion.      Allergies  Ancef; Augmentin; Ciprofloxacin; Haldol; Levaquin; Lyrica; Nsaids; Tamiflu; and Zoloft  Home Medications   Prior to Admission medications   Medication Sig Start Date End Date Taking? Authorizing Provider  albuterol (PROVENTIL) (2.5 MG/3ML) 0.083% nebulizer solution Take 2.5 mg  by nebulization every 6 (six) hours as needed for wheezing or shortness of breath.   Yes Historical Provider, MD  atorvastatin (LIPITOR) 20 MG tablet Take 1 tablet (20 mg total) by mouth daily. 06/02/14  Yes Thurnell Lose, MD  budesonide (PULMICORT) 0.5 MG/2ML nebulizer solution Take 2 mLs (0.5 mg total) by nebulization 2 (two) times daily. DX J43.8 11/04/14  Yes Kathee Delton, MD  cetirizine (ZYRTEC) 10 MG tablet Take 10 mg by mouth daily.   Yes Historical Provider, MD  diltiazem (CARDIZEM) 30 MG tablet Take 1 tablet (30 mg total) by mouth 2 (two) times daily. 12/01/14  Yes Pixie Casino, MD  diphenhydrAMINE (BENADRYL) 25 mg capsule Take 25 mg by mouth every 6 (six) hours as needed for itching or allergies.    Yes Historical Provider, MD  docusate sodium (COLACE) 100 MG capsule Take 100 mg by mouth daily.   Yes Historical Provider, MD  Dulaglutide (TRULICITY) [id number] SOPN Inject 0.75 mg weekly under skin Patient taking differently: Inject into the skin every Sunday. Inject 0.75 mg weekly under skin 01/26/15  Yes Philemon Kingdom, MD  escitalopram (LEXAPRO) 10 MG tablet Take 10 mg by mouth at bedtime.    Yes Historical Provider, MD  famotidine (PEPCID) 40 MG tablet Take 20 mg by mouth 2 (two) times daily.    Yes Historical Provider, MD  fenofibrate 54 MG tablet Take 1 tablet (54 mg total) by mouth daily. 09/13/14  Yes Philemon Kingdom, MD  fluticasone (FLONASE) 50 MCG/ACT nasal spray Place 1 spray into both nostrils daily as needed for allergies or rhinitis.    Yes Historical Provider, MD  furosemide (LASIX) 40 MG tablet Take 1.5 tablets (60 mg total) by mouth 2 (two) times daily. 12/19/14  Yes Pixie Casino, MD  Insulin Glargine (TOUJEO SOLOSTAR) 300 UNIT/ML SOPN Inject 80 Units into the skin at bedtime. 01/26/15  Yes Philemon Kingdom, MD  insulin lispro (HUMALOG KWIKPEN) 100 UNIT/ML KiwkPen Inject 0.22-0.32 mLs (22-32 Units total) into the skin 3 (three) times daily. 20 UNITS TWICE DAILY AND  28 UNITS IN THE EVENING 01/26/15  Yes Philemon Kingdom, MD  ipratropium (ATROVENT) 0.02 % nebulizer solution Take 0.5 mg by nebulization 2 (two) times daily.   Yes Historical Provider, MD  metolazone (ZAROXOLYN) 2.5 MG tablet Take 1 tablet (2.5 mg total) by mouth daily. Patient taking differently: Take 2.5 mg by mouth at bedtime.  10/13/14  Yes Pixie Casino, MD  mirtazapine (REMERON) 15 MG tablet Take 15 mg by mouth at bedtime.   Yes Historical Provider, MD  modafinil (PROVIGIL) 200 MG tablet Take 200 mg by mouth daily.   Yes Historical Provider, MD  nystatin (MYCOSTATIN/NYSTOP) 100000 UNIT/GM POWD Apply topically 2 (two) times daily. USES UNDERNEATH BREAST   Yes Historical Provider, MD  ondansetron (ZOFRAN-ODT) 4 MG disintegrating tablet Take 4 mg by mouth every 8 (eight) hours as needed for nausea or vomiting.   Yes Historical Provider, MD  oxyCODONE (OXY IR/ROXICODONE) 5 MG immediate release tablet Take 5 mg by mouth 2 (two) times daily.   Yes Historical Provider, MD  OXYGEN Inhale 3 L into the lungs continuous. 3 liters 24/7   Yes Historical Provider, MD  pantoprazole (PROTONIX) 40 MG tablet Take 1 tablet (40 mg total) by mouth daily. 06/02/14  Yes Thurnell Lose, MD  potassium chloride SA (K-DUR,KLOR-CON) 20 MEQ tablet Take 20 mEq by mouth 2 (two) times daily.    Yes Historical Provider, MD  rivaroxaban (XARELTO) 20 MG TABS tablet Take 1 tablet (20 mg total) by mouth daily with supper. 02/09/15  Yes Pixie Casino, MD  doxycycline (VIBRAMYCIN) 100 MG capsule Take 1 capsule (100 mg total) by mouth 2 (two) times daily. 02/20/15   Fredia Sorrow, MD  Insulin Pen Needle (CAREFINE PEN NEEDLES) 32G X 4 MM MISC Use 4x a day 10/25/14   Philemon Kingdom, MD   BP 116/79 mmHg  Pulse 102  Temp(Src) 100.5 F (38.1 C) (Oral)  Resp 22  Ht 4\' 11"  (1.499 m)  Wt 214 lb (97.07 kg)  BMI 43.20 kg/m2  SpO2 97% Physical Exam  Constitutional: She is oriented to person, place, and time. She appears  well-developed and well-nourished. No distress.  HENT:  Head: Normocephalic and atraumatic.  Mouth/Throat: Oropharynx is clear and moist.  Eyes: Conjunctivae and EOM are normal. Pupils are equal, round, and reactive to light.  Neck: Normal range of motion. Neck supple.  Cardiovascular:  Murmur heard. Irregular and slightly tachycardic  Pulmonary/Chest: Effort normal and breath sounds normal. No respiratory distress. She has no wheezes.  Abdominal: Soft. Bowel sounds are normal. There is no tenderness.  Musculoskeletal: Normal range of motion.  Neurological: She is alert and oriented to person, place, and time. No cranial nerve deficit. She exhibits normal muscle tone. Coordination normal.  Skin: Skin is warm. There is erythema.  Area of redness to the left cheek measuring about 5 cm. With a central area measuring about 2 cm x 2 cm it seems to be kind of an abrasion.  Nursing note and vitals reviewed.   ED Course  Procedures (including critical care time) Labs Review Labs Reviewed  COMPREHENSIVE METABOLIC PANEL - Abnormal; Notable for the following:    Potassium 3.2 (*)    Chloride 80 (*)    CO2 44 (*)    Glucose, Bld 176 (*)    BUN 23 (*)    Creatinine, Ser 1.11 (*)    Calcium 8.1 (*)    Total Protein 8.4 (*)    ALT 9 (*)    GFR calc non Af Amer 52 (*)    GFR calc Af Amer 60 (*)    All other components within normal limits  CBC WITH DIFFERENTIAL/PLATELET - Abnormal; Notable for the following:    WBC 15.0 (*)    Hemoglobin 11.3 (*)    MCH 25.9 (*)    Neutro Abs 11.9 (*)    All other components within normal limits  URINALYSIS, ROUTINE W REFLEX MICROSCOPIC (NOT AT [facility]) - Abnormal; Notable for the following:    Hgb urine dipstick TRACE (*)    Protein, ur 30 (*)    All other components within normal limits  URINE MICROSCOPIC-ADD ON - Abnormal; Notable for the following:    Squamous Epithelial / LPF MANY (*)  Bacteria, UA FEW (*)    All other components within normal  limits  CULTURE, BLOOD (ROUTINE X 2)  CULTURE, BLOOD (ROUTINE X 2)  URINE CULTURE   Results for orders placed or performed during the hospital encounter of 02/20/15  Comprehensive metabolic panel  Result Value Ref Range   Sodium 137 135 - 145 mmol/L   Potassium 3.2 (L) 3.5 - 5.1 mmol/L   Chloride 80 (L) 101 - 111 mmol/L   CO2 44 (H) 22 - 32 mmol/L   Glucose, Bld 176 (H) 65 - 99 mg/dL   BUN 23 (H) 6 - 20 mg/dL   Creatinine, Ser 1.11 (H) 0.44 - 1.00 mg/dL   Calcium 8.1 (L) 8.9 - 10.3 mg/dL   Total Protein 8.4 (H) 6.5 - 8.1 g/dL   Albumin 3.7 3.5 - 5.0 g/dL   AST 17 15 - 41 U/L   ALT 9 (L) 14 - 54 U/L   Alkaline Phosphatase 98 38 - 126 U/L   Total Bilirubin 0.5 0.3 - 1.2 mg/dL   GFR calc non Af Amer 52 (L) >60 mL/min   GFR calc Af Amer 60 (L) >60 mL/min   Anion gap 13 5 - 15  CBC with Differential  Result Value Ref Range   WBC 15.0 (H) 4.0 - 10.5 K/uL   RBC 4.36 3.87 - 5.11 MIL/uL   Hemoglobin 11.3 (L) 12.0 - 15.0 g/dL   HCT 36.2 36.0 - 46.0 %   MCV 83.0 78.0 - 100.0 fL   MCH 25.9 (L) 26.0 - 34.0 pg   MCHC 31.2 30.0 - 36.0 g/dL   RDW 13.9 11.5 - 15.5 %   Platelets 328 150 - 400 K/uL   Neutrophils Relative % 79 %   Neutro Abs 11.9 (H) 1.7 - 7.7 K/uL   Lymphocytes Relative 13 %   Lymphs Abs 1.9 0.7 - 4.0 K/uL   Monocytes Relative 5 %   Monocytes Absolute 0.7 0.1 - 1.0 K/uL   Eosinophils Relative 3 %   Eosinophils Absolute 0.5 0.0 - 0.7 K/uL   Basophils Relative 0 %   Basophils Absolute 0.0 0.0 - 0.1 K/uL  Urinalysis, Routine w reflex microscopic (not at [facility])  Result Value Ref Range   Color, Urine YELLOW YELLOW   APPearance CLEAR CLEAR   Specific Gravity, Urine 1.010 1.005 - 1.030   pH 7.0 5.0 - 8.0   Glucose, UA NEGATIVE NEGATIVE mg/dL   Hgb urine dipstick TRACE (A) NEGATIVE   Bilirubin Urine NEGATIVE NEGATIVE   Ketones, ur NEGATIVE NEGATIVE mg/dL   Protein, ur 30 (A) NEGATIVE mg/dL   Urobilinogen, UA 1.0 0.0 - 1.0 mg/dL   Nitrite NEGATIVE NEGATIVE   Leukocytes,  UA NEGATIVE NEGATIVE  Urine microscopic-add on  Result Value Ref Range   Squamous Epithelial / LPF MANY (A) RARE   WBC, UA 3-6 <3 WBC/hpf   RBC / HPF 0-2 <3 RBC/hpf   Bacteria, UA FEW (A) RARE     Imaging Review Dg Chest Portable 1 View  02/20/2015   CLINICAL DATA:  Shortness of breath with cough and generalized body aches as well as headache past few days. Evaluate for pneumonia.  EXAM: PORTABLE CHEST - 1 VIEW  COMPARISON:  10/20/2014  FINDINGS: The lungs are adequately inflated with mild prominence of the perihilar markings. There is no focal lobar consolidation or effusion. There is mild stable cardiomegaly. Remainder of the exam is unchanged.  IMPRESSION: Mild stable cardiomegaly with suggestion of minimal vascular congestion.  Electronically Signed   By: Marin Olp M.D.   On: 02/20/2015 18:34   I have personally reviewed and evaluated these images and lab results as part of my medical decision-making.   EKG Interpretation   Date/Time:  Monday February 20 2015 18:00:12 EDT Ventricular Rate:  104 PR Interval:    QRS Duration: 94 QT Interval:  368 QTC Calculation: 484 R Axis:   -103 Text Interpretation:  Atrial fibrillation Left anterior fascicular block  Anterior infarct, old Baseline wander in lead(s) V1 No significant change  since last tracing Reconfirmed by ZACKOWSKI  MD, Becker [phone]) on  02/20/2015 6:29:38 PM      MDM   Final diagnoses:  SOB (shortness of breath)  Facial cellulitis    Patient referred in now for concerns for shortness of breath and possible pneumonia by her primary care doctor. Patient also with a lesion to the left cheek skin with some cellulitis. This will be treated with doxycycline and close follow-up by her primary care doctor. This possibly could be of either an infected abrasion or possible skin cancer. Need to make sure it heals properly. Chest x-rays negative for pneumonia. Patient has a history of extensive COPD but no wheezing here  today patient the oxygen saturation on her normal 3 L of oxygen was in the upper 90s. Patient mentating fine. Patient with low-grade fever. Patient does have blood cultures pending. Patient with persistent tachycardia but it's around the 100 205 EKG shows atrial fibrillation. Patient with a known history of atrial fibrillation. Is on blood thinners. In addition there is some mild hypokalemia. Patient is on potassium supplements.  Patient with a leukocytosis. But no evidence urinary tract infection. Believe the cellulitis may be part of the cause for the fever. Recommending patient follow-up with primary care doctor in 2 days and return for any new or worse symptoms.  Patient given first dose of and by doxycycline here tonight. Patient truly is nontoxic no acute distress. Patient very talkative. However close follow-up will be important because of some concerning signs of the low-grade fever. Patient will return for any new or worse symptoms.    Fredia Sorrow, MD 02/20/15 2050

## 2015-02-21 ENCOUNTER — Ambulatory Visit (INDEPENDENT_AMBULATORY_CARE_PROVIDER_SITE_OTHER): Payer: Medicare Other | Admitting: Pulmonary Disease

## 2015-02-21 ENCOUNTER — Encounter: Payer: Self-pay | Admitting: Pulmonary Disease

## 2015-02-21 VITALS — BP 110/60 | HR 104 | Temp 98.2°F | Ht 59.0 in | Wt 238.6 lb

## 2015-02-21 DIAGNOSIS — J9610 Chronic respiratory failure, unspecified whether with hypoxia or hypercapnia: Secondary | ICD-10-CM | POA: Diagnosis not present

## 2015-02-21 DIAGNOSIS — J42 Unspecified chronic bronchitis: Secondary | ICD-10-CM | POA: Diagnosis not present

## 2015-02-21 DIAGNOSIS — G4733 Obstructive sleep apnea (adult) (pediatric): Secondary | ICD-10-CM | POA: Diagnosis not present

## 2015-02-21 DIAGNOSIS — J449 Chronic obstructive pulmonary disease, unspecified: Secondary | ICD-10-CM

## 2015-02-21 DIAGNOSIS — J4489 Other specified chronic obstructive pulmonary disease: Secondary | ICD-10-CM

## 2015-02-21 NOTE — Progress Notes (Signed)
Chief Complaint  Patient presents with  . Follow-up    former [facility] pt - SOB with minimal exertion.  Cough with green mucus.  Temp 100.2.  Wearing bipap nightly.  No problems with mask or pressure.  Prescribed doxy yesterday - hasn't picked rx up yet.    History of Present Illness: Denise Freeman is a 64 y.o. female former smoker with COPD, OSA, and chronic hypoxic/hypercapnic respiratory failure with OHS.  She was previously followed by Dr. Gwenette Greet.  She was seen in the ER yesterday for progressive cough, sputum, and dyspnea.  She also was found to have skin lesion on her Lt cheek.  She was started on doxycycline.  She uses 3 liters oxygen 24/7.  She uses BiPAP at night.  She is frustrated about her inability to do any activity w/o getting short of breath.   TESTS: PSG 1991 Echo 05/30/14 >> EF 45 to 50% PFT 10/24/14 >> FEV1 0.64 (30%), FEV1% 65, TLC 2.92 (64%), DLCO 54%, + BD  PMhx >> DM, CAD, A fib, Systolic CHF, Bipolar, Fibromyalgia, HH, GERD  Past surgical hx, Medications, Allergies, Family hx, Social hx all reviewed.   Physical Exam: BP 110/60 mmHg  Pulse 104  Temp(Src) 98.2 F (36.8 C) (Oral)  Ht 4\' 11"  (1.499 m)  Wt 238 lb 9.6 oz (108.228 kg)  BMI 48.17 kg/m2  SpO2 92%  General - wearing oxygen, sitting in scooter ENT - No sinus tenderness, no oral exudate, no LAN, smacks lips frequently Cardiac - s1s2 regular, no murmur Chest - scattered rhonchi, no wheeze Back - No focal tenderness Abd - Soft, non-tender Ext - ankle edema Neuro - Normal strength Skin - No rashes Psych - normal mood, and behavior   CMP Latest Ref Rng 02/20/2015 11/24/2014 10/20/2014  Glucose 65 - 99 mg/dL 176(H) 227(H) 163(H)  BUN 6 - 20 mg/dL 23(H) 29(H) 25(H)  Creatinine 0.44 - 1.00 mg/dL 1.11(H) 0.98 0.91  Sodium 135 - 145 mmol/L 137 136 139  Potassium 3.5 - 5.1 mmol/L 3.2(L) 3.7 3.4(L)  Chloride 101 - 111 mmol/L 80(L) 85(L) 88(L)  CO2 22 - 32 mmol/L 44(H) 39(H) 41(H)  Calcium 8.9 - 10.3  mg/dL 8.1(L) 8.5(L) 8.5(L)  Total Protein 6.5 - 8.1 g/dL 8.4(H) - 8.2(H)  Total Bilirubin 0.3 - 1.2 mg/dL 0.5 - 0.5  Alkaline Phos 38 - 126 U/L 98 - 96  AST 15 - 41 U/L 17 - 15  ALT 14 - 54 U/L 9(L) - 9(L)    CBC Latest Ref Rng 02/20/2015 11/24/2014 10/20/2014  WBC 4.0 - 10.5 K/uL 15.0(H) 12.2(H) 12.3(H)  Hemoglobin 12.0 - 15.0 g/dL 11.3(L) 10.2(L) 10.2(L)  Hematocrit 36.0 - 46.0 % 36.2 34.4(L) 35.5(L)  Platelets 150 - 400 K/uL 328 333 324    Dg Chest Portable 1 View  02/20/2015   CLINICAL DATA:  Shortness of breath with cough and generalized body aches as well as headache past few days. Evaluate for pneumonia.  EXAM: PORTABLE CHEST - 1 VIEW  COMPARISON:  10/20/2014  FINDINGS: The lungs are adequately inflated with mild prominence of the perihilar markings. There is no focal lobar consolidation or effusion. There is mild stable cardiomegaly. Remainder of the exam is unchanged.  IMPRESSION: Mild stable cardiomegaly with suggestion of minimal vascular congestion.   Electronically Signed   By: Marin Olp M.D.   On: 02/20/2015 18:34     Assessment/Plan:  Acute bronchitis. 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 Plan: - continue budesonide, ipratropium, albuterol via nebulizer - will need to discuss option of referral to pulmonary rehab at next visit  Obstructive sleep apnea. Plan: - continue BiPAP 16/10 cm H2O qhs  Chronic respiratory failure 2nd to COPD and Obesity hypoventilation syndrome. Plan: - continue 3 liters oxygen [id number]  Chronic systolic heart failure. Plan: - f/u with cardiology  Goals of Care. Plan: - DNR/DNI   Chesley Mires, MD [facility] Pager:  [phone]

## 2015-02-21 NOTE — Patient Instructions (Signed)
Follow up in 6 weeks with Dr. Halford Chessman or Tammy Parrett

## 2015-02-22 DIAGNOSIS — J449 Chronic obstructive pulmonary disease, unspecified: Secondary | ICD-10-CM | POA: Insufficient documentation

## 2015-02-23 LAB — URINE CULTURE

## 2015-02-25 LAB — CULTURE, BLOOD (ROUTINE X 2)
CULTURE: NO GROWTH
Culture: NO GROWTH

## 2015-02-27 DIAGNOSIS — L98491 Non-pressure chronic ulcer of skin of other sites limited to breakdown of skin: Secondary | ICD-10-CM | POA: Diagnosis not present

## 2015-02-28 ENCOUNTER — Encounter: Payer: Self-pay | Admitting: Neurology

## 2015-02-28 ENCOUNTER — Ambulatory Visit (INDEPENDENT_AMBULATORY_CARE_PROVIDER_SITE_OTHER): Payer: Medicare Other | Admitting: Neurology

## 2015-02-28 VITALS — BP 110/64 | HR 70

## 2015-02-28 DIAGNOSIS — G251 Drug-induced tremor: Secondary | ICD-10-CM

## 2015-02-28 DIAGNOSIS — E1342 Other specified diabetes mellitus with diabetic polyneuropathy: Secondary | ICD-10-CM | POA: Diagnosis not present

## 2015-02-28 DIAGNOSIS — E1165 Type 2 diabetes mellitus with hyperglycemia: Secondary | ICD-10-CM | POA: Diagnosis not present

## 2015-02-28 DIAGNOSIS — T887XXD Unspecified adverse effect of drug or medicament, subsequent encounter: Secondary | ICD-10-CM | POA: Diagnosis not present

## 2015-02-28 DIAGNOSIS — IMO0002 Reserved for concepts with insufficient information to code with codable children: Secondary | ICD-10-CM

## 2015-02-28 DIAGNOSIS — G253 Myoclonus: Secondary | ICD-10-CM | POA: Diagnosis not present

## 2015-02-28 DIAGNOSIS — E1142 Type 2 diabetes mellitus with diabetic polyneuropathy: Secondary | ICD-10-CM

## 2015-02-28 DIAGNOSIS — G629 Polyneuropathy, unspecified: Secondary | ICD-10-CM

## 2015-02-28 DIAGNOSIS — T50905D Adverse effect of unspecified drugs, medicaments and biological substances, subsequent encounter: Secondary | ICD-10-CM

## 2015-02-28 MED ORDER — TOPIRAMATE 100 MG PO TABS: 100.0000 mg | ORAL_TABLET | Freq: Every day | ORAL | Status: DC

## 2015-02-28 MED ORDER — TOPIRAMATE 25 MG PO TABS: ORAL_TABLET | ORAL | Status: DC

## 2015-02-28 NOTE — Progress Notes (Signed)
Note routed to Dr Orland Mustard.

## 2015-02-28 NOTE — Progress Notes (Signed)
Denise Freeman was seen today in the movement disorders clinic for neurologic consultation at the request of Denise Freeman, Denise Lies, MD.  The consultation is for the evaluation of tremor.  The records that were made available to me were reviewed.  Pt does have a hx of bipolar d/o but is not on medication specifically for this.  Med hx review does not reveal any exposure to antipsychotics.    Pt reports that tremor 3 years ago, but has gotten worse.  It started in both hands and has continued in both hands.  No leg tremor.  She is on albuterol/ipratropium nebulizer.  She uses the albuterol nebulizer form 4 times per day.  She estimates that she has been on that for a year.  She doesn't think that the nebulizer affects her tremor.  There is a fam hx of tremor in her mother.  02/28/15 update:  The patient is following up today. I reviewed her PCP records.   When I saw her in June, she was describing myoclonus and I suspected that was from gabapentin, but I also told her that I did not recommend gabapentin and Lyrica together and she was on both.  She really did not want to alter the medications, but tells me today that she did get off of the Lyrica in early August.  Despite that, she continues to have some of these movements.   She is still on gabapentin but thinks that she is on it twice per day.  She is unsure (called the pharmacy later and it appears that she is on 300 mg 3 times per day, down from 900 mg 3 times per day).  She reports that she has been on cymbalta previously for neuropathy and it caused hallucinations.  She did have some recent lab work done and her hemoglobin A1c was very elevated at 11.7 in August and in April it was 8.3.  She has both tremor and myoclonus and previously reported that she was not bothered by tremor, but now states that she is.  She asks me if there is some medication that could help both her neuropathy as well as her tremor, meanwhile asking me if she could get off of the  gabapentin.  ALLERGIES:   Allergies  Allergen Reactions  . Ancef [Cefazolin] Nausea And Vomiting  . Augmentin [Amoxicillin-Pot Clavulanate] Nausea Only  . Ciprofloxacin Nausea And Vomiting  . Haldol [Haloperidol] Other (See Comments)    Restless leg  . Levaquin [Levofloxacin In D5w] Other (See Comments)    "afib"  . Lyrica [Pregabalin] Hives  . Nsaids Diarrhea  . Tamiflu [Oseltamivir Phosphate] Other (See Comments)    "water blisters"  . Zoloft [Sertraline Hcl] Other (See Comments)    Jaw problems    CURRENT MEDICATIONS:  Outpatient Encounter Prescriptions as of 02/28/2015  Medication Sig  . albuterol (PROVENTIL) (2.5 MG/3ML) 0.083% nebulizer solution Take 2.5 mg by nebulization every 6 (six) hours as needed for wheezing or shortness of breath.  Marland Kitchen atorvastatin (LIPITOR) 20 MG tablet Take 1 tablet (20 mg total) by mouth daily.  . budesonide (PULMICORT) 0.5 MG/2ML nebulizer solution Take 2 mLs (0.5 mg total) by nebulization 2 (two) times daily. DX J43.8  . cetirizine (ZYRTEC) 10 MG tablet Take 10 mg by mouth daily.  Marland Kitchen diltiazem (CARDIZEM) 30 MG tablet Take 1 tablet (30 mg total) by mouth 2 (two) times daily.  . diphenhydrAMINE (BENADRYL) 25 mg capsule Take 25 mg by mouth every 6 (six) hours as  needed for itching or allergies.   Marland Kitchen docusate sodium (COLACE) 100 MG capsule Take 100 mg by mouth daily.  Marland Kitchen doxycycline (VIBRAMYCIN) 100 MG capsule Take 1 capsule (100 mg total) by mouth 2 (two) times daily.  Marland Kitchen escitalopram (LEXAPRO) 10 MG tablet Take 10 mg by mouth at bedtime.   . famotidine (PEPCID) 40 MG tablet Take 20 mg by mouth 2 (two) times daily.   . fenofibrate 54 MG tablet Take 1 tablet (54 mg total) by mouth daily.  . fluticasone (FLONASE) 50 MCG/ACT nasal spray Place 1 spray into both nostrils daily as needed for allergies or rhinitis.   . furosemide (LASIX) 40 MG tablet Take 1.5 tablets (60 mg total) by mouth 2 (two) times daily.  . Insulin Glargine (TOUJEO SOLOSTAR) 300 UNIT/ML  SOPN Inject 80 Units into the skin at bedtime.  . insulin lispro (HUMALOG KWIKPEN) 100 UNIT/ML KiwkPen Inject 0.22-0.32 mLs (22-32 Units total) into the skin 3 (three) times daily. 20 UNITS TWICE DAILY AND 28 UNITS IN THE EVENING  . metolazone (ZAROXOLYN) 2.5 MG tablet Take 1 tablet (2.5 mg total) by mouth daily. (Patient taking differently: Take 2.5 mg by mouth at bedtime. )  . mirtazapine (REMERON) 15 MG tablet Take 15 mg by mouth at bedtime.  . modafinil (PROVIGIL) 200 MG tablet Take 200 mg by mouth daily.  Marland Kitchen nystatin (MYCOSTATIN/NYSTOP) 100000 UNIT/GM POWD Apply topically 2 (two) times daily. USES UNDERNEATH BREAST  . ondansetron (ZOFRAN-ODT) 4 MG disintegrating tablet Take 4 mg by mouth every 8 (eight) hours as needed for nausea or vomiting.  Marland Kitchen oxyCODONE (OXY IR/ROXICODONE) 5 MG immediate release tablet Take 5 mg by mouth 2 (two) times daily.  . OXYGEN Inhale 3 L into the lungs continuous. 3 liters 24/7  . pantoprazole (PROTONIX) 40 MG tablet Take 1 tablet (40 mg total) by mouth daily.  . potassium chloride SA (K-DUR,KLOR-CON) 20 MEQ tablet Take 20 mEq by mouth 2 (two) times daily.   . rivaroxaban (XARELTO) 20 MG TABS tablet Take 1 tablet (20 mg total) by mouth daily with supper.  . Insulin Pen Needle (CAREFINE PEN NEEDLES) 32G X 4 MM MISC Use 4x a day (Patient not taking: Reported on 02/28/2015)  . [DISCONTINUED] Dulaglutide (TRULICITY) [id number] 0000000 SOPN Inject 0.75 mg weekly under skin (Patient not taking: Reported on 02/28/2015)  . [DISCONTINUED] ipratropium (ATROVENT) 0.02 % nebulizer solution Take 0.5 mg by nebulization 2 (two) times daily.   No facility-administered encounter medications on file as of 02/28/2015.    PAST MEDICAL HISTORY:   Past Medical History  Diagnosis Date  . Diabetes mellitus without complication   . Bipolar 1 disorder   . Fibromyalgia   . COPD (chronic obstructive pulmonary disease)   . CHF (congestive heart failure)   . Asthma   . Atrial fibrillation   .  Neuropathy     Disc Back   . Fibromyalgia   . CHF (congestive heart failure)   . Coronary artery disease   . Anemia   . History of hiatal hernia   . GERD (gastroesophageal reflux disease)   . Shortness of breath dyspnea   . Family history of adverse reaction to anesthesia     Uncle was positive for malignant hyerthermia; patient had testing done and was negative.  . Myocardial infarction 1990  . Sleep apnea     uses biPAP, 10  . Arthritis   . HOH (hard of hearing)     PAST SURGICAL HISTORY:   Past Surgical History  Procedure Laterality Date  . Heel spur surgery Bilateral   . Appendectomy    . Bladder suspension      2003, 2006 and 2010  . Neck surgery N/A 2009    4, 6, and 7 cervical disc replaced  . Bladder stimulator      pt states, "it cannot be turned off".  . Hernia repair      umbilical  . Cataract extraction w/phaco Right 11/29/2014    Procedure: CATARACT EXTRACTION PHACO AND INTRAOCULAR LENS PLACEMENT (IOC);  Surgeon: Rutherford Guys, MD;  Location: [facility];  Service: Ophthalmology;  Laterality: Right;  CDE:3.81  . Cataract extraction w/phaco Left 12/13/2014    Procedure: CATARACT EXTRACTION PHACO AND INTRAOCULAR LENS PLACEMENT (IOC);  Surgeon: Rutherford Guys, MD;  Location: [facility];  Service: Ophthalmology;  Laterality: Left;  CDE:6.59    SOCIAL HISTORY:   Social History   Social History  . Marital Status: Married    Spouse Name: N/A  . Number of Children: N/A  . Years of Education: N/A   Occupational History  . house wife    Social History Main Topics  . Smoking status: Former Smoker -- 2.00 packs/day for 14 years    Types: Cigarettes    Quit date: 04/12/1989  . Smokeless tobacco: Not on file  . Alcohol Use: No  . Drug Use: No  . Sexual Activity: No   Other Topics Concern  . Not on file   Social History Narrative   Married   Disabled   1 child; 77 yrs   6 pregnancies   1 child       FAMILY HISTORY:   Family Status  Relation Status Death Age  .  Mother Deceased 54    heart disease, COPD, breast cancer  . Father Deceased 40    heart disease  . Sister Deceased     5  . Maternal Grandmother Deceased 3  . Maternal Grandfather Deceased 10  . Paternal Grandmother Deceased   . Paternal Grandfather Deceased     ROS:  A complete 10 system review of systems was obtained and was unremarkable apart from what is mentioned above.  PHYSICAL EXAMINATION:    VITALS:   Filed Vitals:   02/28/15 1341  BP: 110/64  Pulse: 70    GEN:  The patient appears stated age and is in NAD. HEENT:  Normocephalic, atraumatic.  The mucous membranes are moist. The superficial temporal arteries are without ropiness or tenderness. CV:  RRR but there is decreased air movement throughout. Lungs:  CTAB.  She is wearing O2.  She does have dyspnea when asked to walk down the hall with a walker. Neck/HEME:  There are no carotid bruits bilaterally.  Neurological examination:  Orientation: The patient is alert and oriented x3.  Cranial nerves: There is good facial symmetry.  The speech is fluent and clear.  She does have mild vocal tremor.   Soft palate rises symmetrically and there is no tongue deviation. Hearing is intact to conversational tone. Sensation: Sensation is intact to light and pinprick throughout (facial, trunk, extremities). Vibration is markedly decreased at the bilateral big toe. There is no consistent extinction with double simultaneous stimulation  Motor: Strength is 5/5 in the bilateral upper and lower extremities.   Shoulder shrug is equal and symmetric.  There is no pronator drift.   Movement examination: Tone: There is normal tone in the bilateral upper extremities.  The tone in the lower extremities is normal.  Abnormal movements: There  is postural tremor that is mild to moderate.  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  No asterixis.  Coordination:  There is no decremation with RAM's, with any form or rapid alternating movements, including alternating  supination and pronation of the forearm, hand opening and closing, finger taps, heel taps and toe taps. Gait and Station: Not tested today  LABS  No results found for: TSH    Chemistry      Component Value Date/Time   NA 137 02/20/2015 1813   K 3.2* 02/20/2015 1813   CL 80* 02/20/2015 1813   CO2 44* 02/20/2015 1813   BUN 23* 02/20/2015 1813   CREATININE 1.11* 02/20/2015 1813      Component Value Date/Time   CALCIUM 8.1* 02/20/2015 1813   ALKPHOS 98 02/20/2015 1813   AST 17 02/20/2015 1813   ALT 9* 02/20/2015 1813   BILITOT 0.5 02/20/2015 1813     Lab Results  Component Value Date   WBC 15.0* 02/20/2015   HGB 11.3* 02/20/2015   HCT 36.2 02/20/2015   MCV 83.0 02/20/2015   PLT 328 02/20/2015     ASSESSMENT/PLAN:  1.  Tremor  -suspect that she may have essential tremor (fam hx of tremor) that has been made worse by qid nebulizer usage with ipratropium/albuterol.  She and I discussed this today.  Previously, she stated that she wanted no treatment for tremor, but has changed her mind.  I explained to her that this is going to be difficult to treat given nebulizer usage frequently.  In addition, she is having some difficulty separating out tremor versus myoclonus and she does have both.  Finally, her hemoglobin A1c has gone from 8.3-11.7 in the last 4 months.  I told her that this certainly could increase tremor.  She understood that and understood that this will make treating tremor very difficult as we are just attempting to cover up symptoms of other diseases.  We are going to try Topamax.  She will work slowly up to 100 mg daily.  She has no history of nephrolithiasis, glaucoma.  Risks, benefits, side effects and alternative therapies were discussed.  The opportunity to ask questions was given and they were answered to the best of my ability.  The patient expressed understanding and willingness to follow the outlined treatment protocols.  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  She was able to get off of her Lyrica but is still on gabapentin.  She worries about getting off of that because of neuropathy, but was agreeable if we could find something to treat the neuropathy.  Hopefully, that will help the myoclonic jerks.  She would decrease her gabapentin from 300 mg 3 times a day to 300 mg twice a day for a week and then she will go to 300 mg daily for a week and then she will stop the medication.  3.  Peripheral neuropathy, diabetic  -Again, she is very worried about trying to get off of the gabapentin, but I think that may be causing the myoclonus.  She was on Lyrica and gabapentin together, which I did not recommend either.  She has tried and failed Cymbalta because of hallucinations.  I am hopeful that perhaps Topamax will help, although I did warn her that this can increase paresthesias when first started.  She expressed understanding.  4.  F/u in the next few months, sooner should new neurologic issues arise.

## 2015-02-28 NOTE — Patient Instructions (Signed)
1. Decrease Gabapentin as follows: Take one tablet twice daily for one week, then one tablet once daily for one week, then stop.  AT THE SAME TIME start Topamax 25 mg tablets. Take one tablet daily for one week, then two tablets daily for one week, then three tablets daily for one week, then fill 100 mg strength and take one daily.

## 2015-03-06 DIAGNOSIS — G8929 Other chronic pain: Secondary | ICD-10-CM | POA: Diagnosis not present

## 2015-03-06 DIAGNOSIS — Z79891 Long term (current) use of opiate analgesic: Secondary | ICD-10-CM | POA: Diagnosis not present

## 2015-03-06 DIAGNOSIS — M25551 Pain in right hip: Secondary | ICD-10-CM | POA: Diagnosis not present

## 2015-03-06 DIAGNOSIS — M25512 Pain in left shoulder: Secondary | ICD-10-CM | POA: Diagnosis not present

## 2015-03-06 DIAGNOSIS — M545 Low back pain: Secondary | ICD-10-CM | POA: Diagnosis not present

## 2015-03-06 DIAGNOSIS — M25552 Pain in left hip: Secondary | ICD-10-CM | POA: Diagnosis not present

## 2015-03-06 DIAGNOSIS — M542 Cervicalgia: Secondary | ICD-10-CM | POA: Diagnosis not present

## 2015-03-06 DIAGNOSIS — M25562 Pain in left knee: Secondary | ICD-10-CM | POA: Diagnosis not present

## 2015-03-09 ENCOUNTER — Ambulatory Visit: Payer: Medicare Other | Admitting: Internal Medicine

## 2015-03-09 ENCOUNTER — Other Ambulatory Visit: Payer: Self-pay | Admitting: *Deleted

## 2015-03-09 NOTE — Telephone Encounter (Signed)
Patient Name: Denise Freeman Gender: Female DOB: 08-Jan-1951 Age: 64 Y 11 M 18 D Return Phone Number: [phone] (Primary) Address: City/State/Zip: [place] Client [facility] Night - Client Client Site [facility] Physician [place], [place] Type Call Caller Name [place] Phone Number [phone] Relationship To Patient Self Is this call to report lab results? No Call Type General Information Initial Comment Caller states she can't make her 11am appointment due to her stomach hurting. She will call to reschedule. General Information Type Appointment Nurse Assessment Guidelines Guideline Title Affirmed Question Affirmed Notes Nurse Date/Time (

## 2015-03-09 NOTE — Telephone Encounter (Signed)
Pt cancelled appt due to stomach pain.

## 2015-03-20 ENCOUNTER — Telehealth: Payer: Self-pay | Admitting: Neurology

## 2015-03-20 NOTE — Telephone Encounter (Signed)
Spoke with patient she states the itching and stomach issues (stomach in knots, nausea) started right after she started taking Topamax and hasn't gotten any better. She has finished the titration and was getting ready to start to 100 mg but doesn't think she can continue. She states she is itching so bad that she hasn't slept in 3 nights. She tried benadryl and she states this did nothing. I advised that she could stop the Topamax but she states she has to have something for her peripheral neuropathy and fibromyalgia pain. I advised that we do not treat this, but her tremor. Please advise.

## 2015-03-20 NOTE — Telephone Encounter (Signed)
Can she come in thurs to discuss?  Maybe we can try primidone but need to talk with her.  In meantime, she can d/c topamax

## 2015-03-20 NOTE — Telephone Encounter (Signed)
Patient started on Topamax. Please advise.

## 2015-03-20 NOTE — Telephone Encounter (Signed)
Pt called to inform that the new med that was give to her /she wasn't sure of med name//is causing her skin to itch all over and an upset stomach//call back @ [phone]

## 2015-03-20 NOTE — Telephone Encounter (Signed)
Is she sure that this medication is the cause?  I have no objection if she holds it but also haven't had this as a SE of this drug

## 2015-03-20 NOTE — Telephone Encounter (Signed)
Appt made with patient.  

## 2015-03-23 ENCOUNTER — Telehealth: Payer: Self-pay | Admitting: Neurology

## 2015-03-23 ENCOUNTER — Ambulatory Visit (INDEPENDENT_AMBULATORY_CARE_PROVIDER_SITE_OTHER): Payer: Medicare Other | Admitting: Neurology

## 2015-03-23 ENCOUNTER — Encounter: Payer: Self-pay | Admitting: Neurology

## 2015-03-23 VITALS — BP 100/64 | HR 110

## 2015-03-23 DIAGNOSIS — Z794 Long term (current) use of insulin: Secondary | ICD-10-CM

## 2015-03-23 DIAGNOSIS — G253 Myoclonus: Secondary | ICD-10-CM

## 2015-03-23 DIAGNOSIS — G251 Drug-induced tremor: Secondary | ICD-10-CM | POA: Diagnosis not present

## 2015-03-23 DIAGNOSIS — E1143 Type 2 diabetes mellitus with diabetic autonomic (poly)neuropathy: Secondary | ICD-10-CM

## 2015-03-23 DIAGNOSIS — IMO0002 Reserved for concepts with insufficient information to code with codable children: Secondary | ICD-10-CM

## 2015-03-23 DIAGNOSIS — E1165 Type 2 diabetes mellitus with hyperglycemia: Secondary | ICD-10-CM

## 2015-03-23 DIAGNOSIS — T50905D Adverse effect of unspecified drugs, medicaments and biological substances, subsequent encounter: Secondary | ICD-10-CM

## 2015-03-23 DIAGNOSIS — K3184 Gastroparesis: Secondary | ICD-10-CM

## 2015-03-23 DIAGNOSIS — E1121 Type 2 diabetes mellitus with diabetic nephropathy: Secondary | ICD-10-CM | POA: Diagnosis not present

## 2015-03-23 MED ORDER — PRIMIDONE 50 MG PO TABS: 50.0000 mg | ORAL_TABLET | Freq: Every day | ORAL | Status: DC

## 2015-03-23 MED ORDER — PRIMIDONE 50 MG PO TABS
50.0000 mg | ORAL_TABLET | Freq: Every day | ORAL | Status: DC
Start: 2015-03-23 — End: 2015-03-23

## 2015-03-23 NOTE — Patient Instructions (Signed)
1. Appt today with Dr Inda Merlin at 4:45 pm. 2. Start Primidone 50 mg tablets. Take 1/2 tablet for 4 nights, then increase to 1 tablet. Prescription has been sent to your pharmacy. The first dose of medication can cause some dizziness/nausea that should go away after the first dose.

## 2015-03-23 NOTE — Telephone Encounter (Signed)
Prescription re-written for 90 day supply and sent to [facility].

## 2015-03-23 NOTE — Progress Notes (Signed)
Note routed to Dr Orland Mustard.

## 2015-03-23 NOTE — Progress Notes (Signed)
Denise Freeman was seen today in the movement disorders clinic for neurologic consultation at the request of MORROW, Marjory Lies, MD.  The consultation is for the evaluation of tremor.  The records that were made available to me were reviewed.  Pt does have a hx of bipolar d/o but is not on medication specifically for this.  Med hx review does not reveal any exposure to antipsychotics.    Pt reports that tremor 3 years ago, but has gotten worse.  It started in both hands and has continued in both hands.  No leg tremor.  She is on albuterol/ipratropium nebulizer.  She uses the albuterol nebulizer form 4 times per day.  She estimates that she has been on that for a year.  She doesn't think that the nebulizer affects her tremor.  There is a fam hx of tremor in her mother.  02/28/15 update:  The patient is following up today. I reviewed her PCP records.   When I saw her in June, she was describing myoclonus and I suspected that was from gabapentin, but I also told her that I did not recommend gabapentin and Lyrica together and she was on both.  She really did not want to alter the medications, but tells me today that she did get off of the Lyrica in early August.  Despite that, she continues to have some of these movements.   She is still on gabapentin but thinks that she is on it twice per day.  She is unsure (called the pharmacy later and it appears that she is on 300 mg 3 times per day, down from 900 mg 3 times per day).  She reports that she has been on cymbalta previously for neuropathy and it caused hallucinations.  She did have some recent lab work done and her hemoglobin A1c was very elevated at 11.7 in August and in April it was 8.3.  She has both tremor and myoclonus and previously reported that she was not bothered by tremor, but now states that she is.  She asks me if there is some medication that could help both her neuropathy as well as her tremor, meanwhile asking me if she could get off of the  gabapentin.  03/23/15 update:  The patient is seen today in follow-up.  Last visit, I started her on Topamax.  She got up to 75 mg daily and stated that it caused itchiness and GI upset.    While I told her this would be a very unusual side effect, she felt pretty convinced that it was the medication.  She discontinued it 2 days ago.  Strangely, it did work well.  She admits that the tremor is starting to come back.  She admits that she still has GI upset despite going off of the topamax.  Only ate a banana today and that barely feels settled.  Diabetes is more out of control and getting some blood sugar readings as high as 600. Now that she is off of the gabapentin, the myoclonus went away completely.    ALLERGIES:   Allergies  Allergen Reactions  . Ancef [Cefazolin] Nausea And Vomiting  . Augmentin [Amoxicillin-Pot Clavulanate] Nausea Only  . Ciprofloxacin Nausea And Vomiting  . Haldol [Haloperidol] Other (See Comments)    Restless leg  . Levaquin [Levofloxacin In D5w] Other (See Comments)    "afib"  . Lyrica [Pregabalin] Hives  . Nsaids Diarrhea  . Tamiflu [Oseltamivir Phosphate] Other (See Comments)    "water  blisters"  . Zoloft [Sertraline Hcl] Other (See Comments)    Jaw problems    CURRENT MEDICATIONS:  Outpatient Encounter Prescriptions as of 03/23/2015  Medication Sig  . albuterol (PROVENTIL) (2.5 MG/3ML) 0.083% nebulizer solution Take 2.5 mg by nebulization every 6 (six) hours as needed for wheezing or shortness of breath.  Marland Kitchen atorvastatin (LIPITOR) 20 MG tablet Take 1 tablet (20 mg total) by mouth daily.  . budesonide (PULMICORT) 0.5 MG/2ML nebulizer solution Take 2 mLs (0.5 mg total) by nebulization 2 (two) times daily. DX J43.8  . cetirizine (ZYRTEC) 10 MG tablet Take 10 mg by mouth daily.  Marland Kitchen diltiazem (CARDIZEM) 30 MG tablet Take 1 tablet (30 mg total) by mouth 2 (two) times daily.  . diphenhydrAMINE (BENADRYL) 25 mg capsule Take 25 mg by mouth every 6 (six) hours as needed  for itching or allergies.   Marland Kitchen docusate sodium (COLACE) 100 MG capsule Take 100 mg by mouth daily.  Marland Kitchen escitalopram (LEXAPRO) 10 MG tablet Take 10 mg by mouth at bedtime.   . famotidine (PEPCID) 40 MG tablet Take 20 mg by mouth 2 (two) times daily.   . fenofibrate 54 MG tablet Take 1 tablet (54 mg total) by mouth daily.  . fluticasone (FLONASE) 50 MCG/ACT nasal spray Place 1 spray into both nostrils daily as needed for allergies or rhinitis.   . furosemide (LASIX) 40 MG tablet Take 1.5 tablets (60 mg total) by mouth 2 (two) times daily.  . Insulin Glargine (TOUJEO SOLOSTAR) 300 UNIT/ML SOPN Inject 80 Units into the skin at bedtime.  . insulin lispro (HUMALOG KWIKPEN) 100 UNIT/ML KiwkPen Inject 0.22-0.32 mLs (22-32 Units total) into the skin 3 (three) times daily. 20 UNITS TWICE DAILY AND 28 UNITS IN THE EVENING  . Insulin Pen Needle (CAREFINE PEN NEEDLES) 32G X 4 MM MISC Use 4x a day  . metolazone (ZAROXOLYN) 2.5 MG tablet Take 1 tablet (2.5 mg total) by mouth daily. (Patient taking differently: Take 2.5 mg by mouth at bedtime. )  . mirtazapine (REMERON) 15 MG tablet Take 15 mg by mouth at bedtime.  . modafinil (PROVIGIL) 200 MG tablet Take 200 mg by mouth daily.  Marland Kitchen nystatin (MYCOSTATIN/NYSTOP) 100000 UNIT/GM POWD Apply topically 2 (two) times daily. USES UNDERNEATH BREAST  . ondansetron (ZOFRAN-ODT) 4 MG disintegrating tablet Take 4 mg by mouth every 8 (eight) hours as needed for nausea or vomiting.  Marland Kitchen oxyCODONE (OXY IR/ROXICODONE) 5 MG immediate release tablet Take 5 mg by mouth 2 (two) times daily.  . OXYGEN Inhale 3 L into the lungs continuous. 3 liters 24/7  . pantoprazole (PROTONIX) 40 MG tablet Take 1 tablet (40 mg total) by mouth daily.  . potassium chloride SA (K-DUR,KLOR-CON) 20 MEQ tablet Take 20 mEq by mouth 2 (two) times daily.   . rivaroxaban (XARELTO) 20 MG TABS tablet Take 1 tablet (20 mg total) by mouth daily with supper.  . [DISCONTINUED] doxycycline (VIBRAMYCIN) 100 MG capsule  Take 1 capsule (100 mg total) by mouth 2 (two) times daily.  . [DISCONTINUED] topiramate (TOPAMAX) 100 MG tablet Take 1 tablet (100 mg total) by mouth daily.  . [DISCONTINUED] topiramate (TOPAMAX) 25 MG tablet Take one tablet daily for one week, then two tablets daily for one week, then three tablets daily for one week then switch to Topamax 100 mg   No facility-administered encounter medications on file as of 03/23/2015.    PAST MEDICAL HISTORY:   Past Medical History  Diagnosis Date  . Diabetes mellitus without complication (  [place])   . Bipolar 1 disorder ([place])   . Fibromyalgia   . COPD (chronic obstructive pulmonary disease) ([place])   . CHF (congestive heart failure) ([place])   . Asthma   . Atrial fibrillation ([place])   . Neuropathy ([facility])     Disc Back   . Fibromyalgia   . CHF (congestive heart failure) ([place])   . Coronary artery disease   . Anemia   . History of hiatal hernia   . GERD (gastroesophageal reflux disease)   . Shortness of breath dyspnea   . Family history of adverse reaction to anesthesia     Uncle was positive for malignant hyerthermia; patient had testing done and was negative.  . Myocardial infarction ([place]) 1990  . Sleep apnea     uses biPAP, 10  . Arthritis   . HOH (hard of hearing)     PAST SURGICAL HISTORY:   Past Surgical History  Procedure Laterality Date  . Heel spur surgery Bilateral   . Appendectomy    . Bladder suspension      2003, 2006 and 2010  . Neck surgery N/A 2009    4, 6, and 7 cervical disc replaced  . Bladder stimulator      pt states, "it cannot be turned off".  . Hernia repair      umbilical  . Cataract extraction w/phaco Right 11/29/2014    Procedure: CATARACT EXTRACTION PHACO AND INTRAOCULAR LENS PLACEMENT (IOC);  Surgeon: Rutherford Guys, MD;  Location: [facility];  Service: Ophthalmology;  Laterality: Right;  CDE:3.81  . Cataract extraction w/phaco Left 12/13/2014    Procedure: CATARACT EXTRACTION PHACO AND INTRAOCULAR LENS PLACEMENT (IOC);   Surgeon: Rutherford Guys, MD;  Location: [facility];  Service: Ophthalmology;  Laterality: Left;  CDE:6.59    SOCIAL HISTORY:   Social History   Social History  . Marital Status: Married    Spouse Name: N/A  . Number of Children: N/A  . Years of Education: N/A   Occupational History  . house wife    Social History Main Topics  . Smoking status: Former Smoker -- 2.00 packs/day for 14 years    Types: Cigarettes    Quit date: 04/12/1989  . Smokeless tobacco: Not on file  . Alcohol Use: No  . Drug Use: No  . Sexual Activity: No   Other Topics Concern  . Not on file   Social History Narrative   Married   Disabled   1 child; 95 yrs   6 pregnancies   1 child       FAMILY HISTORY:   Family Status  Relation Status Death Age  . Mother Deceased 22    heart disease, COPD, breast cancer  . Father Deceased 31    heart disease  . Sister Deceased     43  . Maternal Grandmother Deceased 26  . Maternal Grandfather Deceased 91  . Paternal Grandmother Deceased   . Paternal Grandfather Deceased     ROS:  A complete 10 system review of systems was obtained and was unremarkable apart from what is mentioned above.  PHYSICAL EXAMINATION:    VITALS:   Filed Vitals:   03/23/15 1018  BP: 100/64  Pulse: 110    GEN:  The patient appears stated age and is in NAD.  She is tearful HEENT:  Normocephalic, atraumatic.  The mucous membranes are moist. The superficial temporal arteries are without ropiness or tenderness. CV:  RRR but there is decreased air movement throughout. Lungs:  CTAB.  She is wearing O2.   Neck/HEME:  There are no carotid bruits bilaterally.  Neurological examination:  Orientation: The patient is alert and oriented x3.  Cranial nerves: There is good facial symmetry.  The speech is fluent and clear.  She does have mild vocal tremor.   Soft palate rises symmetrically and there is no tongue deviation. Hearing is intact to conversational tone. Sensation: Sensation is  intact to light touch throughout Motor: Strength is 5/5 in the bilateral upper and lower extremities.   Shoulder shrug is equal and symmetric.  There is no pronator drift.   Movement examination: Tone: There is normal tone in the bilateral upper extremities.  The tone in the lower extremities is normal.  Abnormal movements: There is virtually no tremor today and also no myoclonus Coordination:  There is no decremation with RAM's, with any form or rapid alternating movements, including alternating supination and pronation of the forearm, hand opening and closing, finger taps, heel taps and toe taps. Gait and Station: Not tested today  LABS  No results found for: TSH    Chemistry      Component Value Date/Time   NA 137 02/20/2015 1813   K 3.2* 02/20/2015 1813   CL 80* 02/20/2015 1813   CO2 44* 02/20/2015 1813   BUN 23* 02/20/2015 1813   CREATININE 1.11* 02/20/2015 1813      Component Value Date/Time   CALCIUM 8.1* 02/20/2015 1813   ALKPHOS 98 02/20/2015 1813   AST 17 02/20/2015 1813   ALT 9* 02/20/2015 1813   BILITOT 0.5 02/20/2015 1813     Lab Results  Component Value Date   WBC 15.0* 02/20/2015   HGB 11.3* 02/20/2015   HCT 36.2 02/20/2015   MCV 83.0 02/20/2015   PLT 328 02/20/2015     ASSESSMENT/PLAN:  1.  Tremor  -suspect that she may have essential tremor (fam hx of tremor) that has been made worse by qid nebulizer usage with ipratropium/albuterol.  Tremor was actually virtually gone today.   Had a long discussion with the patient today.  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  She cannot be on beta blockers for tremor because of COPD.  Cymbalta caused hallucinations.  Primidone is an option but it has an interaction with Xarelto.  I emailed her cardiologist yesterday, as I anticipated her coming in today and this being an issue.  He was agreeable to her starting  and I talked to the patient about this potentially lowering efficacy of the Xarelto and she expressed understanding.  I talked to her about implications of that in full understanding was expressed.  2. Myoclonus  -Completely gone now that she is off of gabapentin.  3.  Peripheral neuropathy, diabetic  -She is very upset about neuropathy symptoms.  Gabapentin caused myoclonus.  She has been on Lyrica in the past and insists it caused sores, and even if it didn't is very similar to gabapentin and can contribute to myoclonus.  She had hallucinations with Cymbalta.  I told her I did not want to add further medication today, as I was already adding primidone.  We could use one of the old tricyclics if we have to, but I am resistant right now to adding more medication and I told her we can address this further in the future.  4.  Nausea, that I suspect is due to diabetic gastroparesis  -  I made a follow-up appointment today with her primary care physician.  There were kind enough to actually work her in today.  5.  F/u in the next few months, sooner should new neurologic issues arise.

## 2015-03-23 NOTE — Telephone Encounter (Signed)
[name] from [employer] called and left message on voice mail pt wants a 90 day supply she did not leave the name of the medication please call her at [phone]

## 2015-03-30 ENCOUNTER — Emergency Department (HOSPITAL_COMMUNITY): Payer: Medicare Other

## 2015-03-30 ENCOUNTER — Emergency Department (HOSPITAL_COMMUNITY)
Admission: EM | Admit: 2015-03-30 | Discharge: 2015-03-30 | Disposition: A | Discharge status: Home / Self Care | Payer: Medicare Other | Attending: Emergency Medicine | Admitting: Emergency Medicine

## 2015-03-30 ENCOUNTER — Encounter (HOSPITAL_COMMUNITY): Payer: Self-pay | Admitting: Emergency Medicine

## 2015-03-30 DIAGNOSIS — I4891 Unspecified atrial fibrillation: Secondary | ICD-10-CM | POA: Diagnosis not present

## 2015-03-30 DIAGNOSIS — Z7951 Long term (current) use of inhaled steroids: Secondary | ICD-10-CM | POA: Diagnosis not present

## 2015-03-30 DIAGNOSIS — J441 Chronic obstructive pulmonary disease with (acute) exacerbation: Secondary | ICD-10-CM | POA: Insufficient documentation

## 2015-03-30 DIAGNOSIS — Z79899 Other long term (current) drug therapy: Secondary | ICD-10-CM | POA: Insufficient documentation

## 2015-03-30 DIAGNOSIS — R05 Cough: Secondary | ICD-10-CM | POA: Diagnosis not present

## 2015-03-30 DIAGNOSIS — G4730 Sleep apnea, unspecified: Secondary | ICD-10-CM | POA: Insufficient documentation

## 2015-03-30 DIAGNOSIS — Z7901 Long term (current) use of anticoagulants: Secondary | ICD-10-CM | POA: Insufficient documentation

## 2015-03-30 DIAGNOSIS — E1165 Type 2 diabetes mellitus with hyperglycemia: Secondary | ICD-10-CM | POA: Insufficient documentation

## 2015-03-30 DIAGNOSIS — M1990 Unspecified osteoarthritis, unspecified site: Secondary | ICD-10-CM | POA: Insufficient documentation

## 2015-03-30 DIAGNOSIS — H9190 Unspecified hearing loss, unspecified ear: Secondary | ICD-10-CM | POA: Insufficient documentation

## 2015-03-30 DIAGNOSIS — M797 Fibromyalgia: Secondary | ICD-10-CM | POA: Insufficient documentation

## 2015-03-30 DIAGNOSIS — R739 Hyperglycemia, unspecified: Secondary | ICD-10-CM

## 2015-03-30 DIAGNOSIS — Z87891 Personal history of nicotine dependence: Secondary | ICD-10-CM | POA: Insufficient documentation

## 2015-03-30 DIAGNOSIS — Z794 Long term (current) use of insulin: Secondary | ICD-10-CM | POA: Insufficient documentation

## 2015-03-30 DIAGNOSIS — D649 Anemia, unspecified: Secondary | ICD-10-CM | POA: Insufficient documentation

## 2015-03-30 DIAGNOSIS — E669 Obesity, unspecified: Secondary | ICD-10-CM | POA: Diagnosis not present

## 2015-03-30 DIAGNOSIS — F319 Bipolar disorder, unspecified: Secondary | ICD-10-CM | POA: Diagnosis not present

## 2015-03-30 DIAGNOSIS — Z9981 Dependence on supplemental oxygen: Secondary | ICD-10-CM | POA: Insufficient documentation

## 2015-03-30 DIAGNOSIS — I2510 Atherosclerotic heart disease of native coronary artery without angina pectoris: Secondary | ICD-10-CM | POA: Diagnosis not present

## 2015-03-30 DIAGNOSIS — K219 Gastro-esophageal reflux disease without esophagitis: Secondary | ICD-10-CM | POA: Diagnosis not present

## 2015-03-30 DIAGNOSIS — I252 Old myocardial infarction: Secondary | ICD-10-CM | POA: Diagnosis not present

## 2015-03-30 DIAGNOSIS — R0602 Shortness of breath: Secondary | ICD-10-CM | POA: Diagnosis not present

## 2015-03-30 DIAGNOSIS — I509 Heart failure, unspecified: Secondary | ICD-10-CM | POA: Insufficient documentation

## 2015-03-30 LAB — CBC
HCT: 34.9 % — ABNORMAL LOW (ref 36.0–46.0)
HEMOGLOBIN: 10.7 g/dL — AB (ref 12.0–15.0)
MCH: 26.3 pg (ref 26.0–34.0)
MCHC: 30.7 g/dL (ref 30.0–36.0)
MCV: 85.7 fL (ref 78.0–100.0)
PLATELETS: 354 10*3/uL (ref 150–400)
RBC: 4.07 MIL/uL (ref 3.87–5.11)
RDW: 13.7 % (ref 11.5–15.5)
WBC: 10.7 10*3/uL — AB (ref 4.0–10.5)

## 2015-03-30 LAB — BRAIN NATRIURETIC PEPTIDE: B NATRIURETIC PEPTIDE 5: 270 pg/mL — AB (ref 0.0–100.0)

## 2015-03-30 LAB — BASIC METABOLIC PANEL
ANION GAP: 9 (ref 5–15)
BUN: 14 mg/dL (ref 6–20)
CALCIUM: 8.3 mg/dL — AB (ref 8.9–10.3)
CO2: 35 mmol/L — ABNORMAL HIGH (ref 22–32)
Chloride: 95 mmol/L — ABNORMAL LOW (ref 101–111)
Creatinine, Ser: 1.07 mg/dL — ABNORMAL HIGH (ref 0.44–1.00)
GFR, EST NON AFRICAN AMERICAN: 54 mL/min — AB (ref >60)
Glucose, Bld: 199 mg/dL — ABNORMAL HIGH (ref 65–99)
Potassium: 3.2 mmol/L — ABNORMAL LOW (ref 3.5–5.1)
SODIUM: 139 mmol/L (ref 135–145)

## 2015-03-30 LAB — TROPONIN I

## 2015-03-30 MED ORDER — PREDNISONE 50 MG PO TABS
60.0000 mg | ORAL_TABLET | Freq: Once | ORAL | Status: AC
  Administered 2015-03-30: 60 mg via ORAL
  Filled 2015-03-30 (×2): qty 1

## 2015-03-30 MED ORDER — IPRATROPIUM-ALBUTEROL 0.5-2.5 (3) MG/3ML IN SOLN
3.0000 mL | Freq: Once | RESPIRATORY_TRACT | Status: AC
Start: 2015-03-30 — End: 2015-03-30
  Administered 2015-03-30: 3 mL via RESPIRATORY_TRACT
  Filled 2015-03-30: qty 3

## 2015-03-30 MED ORDER — PREDNISONE 20 MG PO TABS: 40.0000 mg | ORAL_TABLET | Freq: Every day | ORAL | Status: DC

## 2015-03-30 NOTE — ED Notes (Addendum)
Pt reports increased SOB and dyspnea with exertion. Denies CP. Pt hx of CHF, COPD, and Afib. Pt wears 3L O2. VSS. AOx4

## 2015-03-30 NOTE — Discharge Instructions (Signed)
Asthma, Adult Asthma is a recurring condition in which the airways tighten and narrow. Asthma can make it difficult to breathe. It can cause coughing, wheezing, and shortness of breath. Asthma episodes, also called asthma attacks, range from minor to life-threatening. Asthma cannot be cured, but medicines and lifestyle changes can help control it. CAUSES Asthma is believed to be caused by inherited (genetic) and environmental factors, but its exact cause is unknown. Asthma may be triggered by allergens, lung infections, or irritants in the air. Asthma triggers are different for each person. Common triggers include:   Animal dander.  Dust mites.  Cockroaches.  Pollen from trees or grass.  Mold.  Smoke.  Air pollutants such as dust, household cleaners, hair sprays, aerosol sprays, paint fumes, strong chemicals, or strong odors.  Cold air, weather changes, and winds (which increase molds and pollens in the air).  Strong emotional expressions such as crying or laughing hard.  Stress.  Certain medicines (such as aspirin) or types of drugs (such as beta-blockers).  Sulfites in foods and drinks. Foods and drinks that may contain sulfites include dried fruit, potato chips, and sparkling grape juice.  Infections or inflammatory conditions such as the flu, a cold, or an inflammation of the nasal membranes (rhinitis).  Gastroesophageal reflux disease (GERD).  Exercise or strenuous activity. SYMPTOMS Symptoms may occur immediately after asthma is triggered or many hours later. Symptoms include:  Wheezing.  Excessive nighttime or early morning coughing.  Frequent or severe coughing with a common cold.  Chest tightness.  Shortness of breath. DIAGNOSIS  The diagnosis of asthma is made by a review of your medical history and a physical exam. Tests may also be performed. These may include:  Lung function studies. These tests show how much air you breathe in and out.  Allergy  tests.  Imaging tests such as X-rays. TREATMENT  Asthma cannot be cured, but it can usually be controlled. Treatment involves identifying and avoiding your asthma triggers. It also involves medicines. There are 2 classes of medicine used for asthma treatment:   Controller medicines. These prevent asthma symptoms from occurring. They are usually taken every day.  Reliever or rescue medicines. These quickly relieve asthma symptoms. They are used as needed and provide short-term relief. Your health care provider will help you create an asthma action plan. An asthma action plan is a written plan for managing and treating your asthma attacks. It includes a list of your asthma triggers and how they may be avoided. It also includes information on when medicines should be taken and when their dosage should be changed. An action plan may also involve the use of a device called a peak flow meter. A peak flow meter measures how well the lungs are working. It helps you monitor your condition. HOME CARE INSTRUCTIONS   Take medicines only as directed by your health care provider. Speak with your health care provider if you have questions about how or when to take the medicines.  Use a peak flow meter as directed by your health care provider. Record and keep track of readings.  Understand and use the action plan to help minimize or stop an asthma attack without needing to seek medical care.  Control your home environment in the following ways to help prevent asthma attacks:  Do not smoke. Avoid being exposed to secondhand smoke.  Change your heating and air conditioning filter regularly.  Limit your use of fireplaces and wood stoves.  Get rid of pests (such as roaches   and mice) and their droppings.  Throw away plants if you see mold on them.  Clean your floors and dust regularly. Use unscented cleaning products.  Try to have someone else vacuum for you regularly. Stay out of rooms while they are  being vacuumed and for a short while afterward. If you vacuum, use a dust mask from a hardware store, a double-layered or microfilter vacuum cleaner bag, or a vacuum cleaner with a HEPA filter.  Replace carpet with wood, tile, or vinyl flooring. Carpet can trap dander and dust.  Use allergy-proof pillows, mattress covers, and box spring covers.  Wash bed sheets and blankets every week in hot water and dry them in a dryer.  Use blankets that are made of polyester or cotton.  Clean bathrooms and kitchens with bleach. If possible, have someone repaint the walls in these rooms with mold-resistant paint. Keep out of the rooms that are being cleaned and painted.  Wash hands frequently. SEEK MEDICAL CARE IF:   You have wheezing, shortness of breath, or a cough even if taking medicine to prevent attacks.  The colored mucus you cough up (sputum) is thicker than usual.  Your sputum changes from clear or white to yellow, green, gray, or bloody.  You have any problems that may be related to the medicines you are taking (such as a rash, itching, swelling, or trouble breathing).  You are using a reliever medicine more than 2-3 times per week.  Your peak flow is still at 50-79% of your personal best after following your action plan for 1 hour.  You have a fever. SEEK IMMEDIATE MEDICAL CARE IF:   You seem to be getting worse and are unresponsive to treatment during an asthma attack.  You are short of breath even at rest.  You get short of breath when doing very little physical activity.  You have difficulty eating, drinking, or talking due to asthma symptoms.  You develop chest pain.  You develop a fast heartbeat.  You have a bluish color to your lips or fingernails.  You are light-headed, dizzy, or faint.  Your peak flow is less than 50% of your personal best.   This information is not intended to replace advice given to you by your health care provider. Make sure you discuss any  questions you have with your health care provider.   Document Released: 05/20/2005 Document Revised: 02/08/2015 Document Reviewed: 12/17/2012 [employer] Interactive Patient Education 2016 [employer]  

## 2015-03-30 NOTE — ED Provider Notes (Signed)
CSN: [id number]     Arrival date & time 03/30/15  1458 History   First MD Initiated Contact with Patient 03/30/15 1554     Chief Complaint  Patient presents with  . Shortness of Breath    Patient is a 64 y.o. female presenting with shortness of breath. The history is provided by the patient.  Shortness of Breath Severity:  Moderate Onset quality:  Gradual Duration:  3 days Timing:  Constant Progression:  Worsening Chronicity:  Recurrent Context: activity   Relieved by:  Nothing Worsened by:  Activity Associated symptoms: no chest pain, no cough and no fever   Pt has history of CHF and COPD.  She has been coughing a lot.  She has been getting more short of breath.  She thinks she may be full of fluid.  No fever.  No cough.  No leg swelling.  Past Medical History  Diagnosis Date  . Diabetes mellitus without complication ([place])   . Bipolar 1 disorder ([place])   . Fibromyalgia   . COPD (chronic obstructive pulmonary disease) ([place])   . CHF (congestive heart failure) ([place])   . Asthma   . Atrial fibrillation ([place])   . Neuropathy (HCC)     Disc Back   . Fibromyalgia   . CHF (congestive heart failure) ([place])   . Coronary artery disease   . Anemia   . History of hiatal hernia   . GERD (gastroesophageal reflux disease)   . Shortness of breath dyspnea   . Family history of adverse reaction to anesthesia     Uncle was positive for malignant hyerthermia; patient had testing done and was negative.  . Myocardial infarction ([place]) 1990  . Sleep apnea     uses biPAP, 10  . Arthritis   . HOH (hard of hearing)    Past Surgical History  Procedure Laterality Date  . Heel spur surgery Bilateral   . Appendectomy    . Bladder suspension      2003, 2006 and 2010  . Neck surgery N/A 2009    4, 6, and 7 cervical disc replaced  . Bladder stimulator      pt states, "it cannot be turned off".  . Hernia repair      umbilical  . Cataract extraction w/phaco Right 11/29/2014    Procedure: CATARACT  EXTRACTION PHACO AND INTRAOCULAR LENS PLACEMENT (IOC);  Surgeon: Rutherford Guys, MD;  Location: [facility];  Service: Ophthalmology;  Laterality: Right;  CDE:3.81  . Cataract extraction w/phaco Left 12/13/2014    Procedure: CATARACT EXTRACTION PHACO AND INTRAOCULAR LENS PLACEMENT (IOC);  Surgeon: Rutherford Guys, MD;  Location: [facility];  Service: Ophthalmology;  Laterality: Left;  CDE:6.59   Family History  Problem Relation Age of Onset  . Heart disease Mother   . COPD Mother   . Cancer Mother     Breast cancer  . Diabetes Mother   . Heart disease Father   . COPD Sister   . Heart disease Sister   . Diabetes Sister   . Heart disease Maternal Grandmother    Social History  Substance Use Topics  . Smoking status: Former Smoker -- 2.00 packs/day for 14 years    Types: Cigarettes    Quit date: 04/12/1989  . Smokeless tobacco: None  . Alcohol Use: No   OB History    No data available     Review of Systems  Constitutional: Negative for fever.  Respiratory: Positive for shortness of breath. Negative for cough.  Cardiovascular: Negative for chest pain.  All other systems reviewed and are negative.     Allergies  Ancef; Augmentin; Ciprofloxacin; Haldol; Levaquin; Lyrica; Nsaids; Tamiflu; and Zoloft  Home Medications   Prior to Admission medications   Medication Sig Start Date End Date Taking? Authorizing Provider  atorvastatin (LIPITOR) 20 MG tablet Take 1 tablet (20 mg total) by mouth daily. 06/02/14  Yes Thurnell Lose, MD  budesonide (PULMICORT) 0.5 MG/2ML nebulizer solution Take 2 mLs (0.5 mg total) by nebulization 2 (two) times daily. DX J43.8 11/04/14  Yes Kathee Delton, MD  cetirizine (ZYRTEC) 10 MG tablet Take 10 mg by mouth daily.   Yes Historical Provider, MD  diltiazem (CARDIZEM) 30 MG tablet Take 1 tablet (30 mg total) by mouth 2 (two) times daily. 12/01/14  Yes Pixie Casino, MD  diphenhydrAMINE (BENADRYL) 25 mg capsule Take 25 mg by mouth every 6 (six) hours as needed for  itching or allergies.    Yes Historical Provider, MD  docusate sodium (COLACE) 100 MG capsule Take 100 mg by mouth daily.   Yes Historical Provider, MD  escitalopram (LEXAPRO) 10 MG tablet Take 10 mg by mouth at bedtime.    Yes Historical Provider, MD  famotidine (PEPCID) 40 MG tablet Take 20 mg by mouth 2 (two) times daily.    Yes Historical Provider, MD  fenofibrate 54 MG tablet Take 1 tablet (54 mg total) by mouth daily. 09/13/14  Yes Philemon Kingdom, MD  furosemide (LASIX) 40 MG tablet Take 1.5 tablets (60 mg total) by mouth 2 (two) times daily. 12/19/14  Yes Pixie Casino, MD  Insulin Glargine (TOUJEO SOLOSTAR) 300 UNIT/ML SOPN Inject 80 Units into the skin at bedtime. 01/26/15  Yes Philemon Kingdom, MD  insulin lispro (HUMALOG KWIKPEN) 100 UNIT/ML KiwkPen Inject 0.22-0.32 mLs (22-32 Units total) into the skin 3 (three) times daily. [place] EVENING Patient taking differently: Inject 20-28 Units into the skin 3 (three) times daily. 20 UNITS in the AM and 22 UNITS AT LUNCH AND 28 UNITS IN THE EVENING 01/26/15  Yes Philemon Kingdom, MD  metolazone (ZAROXOLYN) 2.5 MG tablet Take 1 tablet (2.5 mg total) by mouth daily. Patient taking differently: Take 2.5 mg by mouth at bedtime.  10/13/14  Yes Pixie Casino, MD  mirtazapine (REMERON) 15 MG tablet Take 15 mg by mouth at bedtime.   Yes Historical Provider, MD  modafinil (PROVIGIL) 200 MG tablet Take 200 mg by mouth daily.   Yes Historical Provider, MD  nystatin (MYCOSTATIN/NYSTOP) 100000 UNIT/GM POWD Apply topically 2 (two) times daily. USES UNDERNEATH BREAST   Yes Historical Provider, MD  oxyCODONE (OXY IR/ROXICODONE) 5 MG immediate release tablet Take 5 mg by mouth 2 (two) times daily.   Yes Historical Provider, MD  OXYGEN Inhale 3 L into the lungs continuous. 3 liters 24/7   Yes Historical Provider, MD  pantoprazole (PROTONIX) 40 MG tablet Take 1 tablet (40 mg total) by mouth daily. 06/02/14  Yes Thurnell Lose, MD   potassium chloride SA (K-DUR,KLOR-CON) 20 MEQ tablet Take 20 mEq by mouth 3 (three) times daily.    Yes Historical Provider, MD  primidone (MYSOLINE) 50 MG tablet Take 1 tablet (50 mg total) by mouth at bedtime. 03/23/15  Yes Rebecca S Tat, DO  rivaroxaban (XARELTO) 20 MG TABS tablet Take 1 tablet (20 mg total) by mouth daily with supper. 02/09/15  Yes Pixie Casino, MD  albuterol (PROVENTIL) (2.5 MG/3ML) 0.083% nebulizer  solution Take 2.5 mg by nebulization every 6 (six) hours as needed for wheezing or shortness of breath.    Historical Provider, MD  fluticasone (FLONASE) 50 MCG/ACT nasal spray Place 1 spray into both nostrils daily as needed for allergies or rhinitis.     Historical Provider, MD  Insulin Pen Needle (CAREFINE PEN NEEDLES) 32G X 4 MM MISC Use 4x a day 10/25/14   Philemon Kingdom, MD  ondansetron (ZOFRAN-ODT) 4 MG disintegrating tablet Take 4 mg by mouth every 8 (eight) hours as needed for nausea or vomiting.    Historical Provider, MD  predniSONE (DELTASONE) 20 MG tablet Take 2 tablets (40 mg total) by mouth daily. 03/30/15   Dorie Rank, MD   BP 115/97 mmHg  Pulse 75  Temp(Src) 98.9 F (37.2 C) (Oral)  Resp 20  Ht 4\' 11"  (1.499 m)  Wt 212 lb (96.163 kg)  BMI 42.80 kg/m2  SpO2 99% Physical Exam  Constitutional: No distress.  Obese   HENT:  Head: Normocephalic and atraumatic.  Right Ear: External ear normal.  Left Ear: External ear normal.  Eyes: Conjunctivae are normal. Right eye exhibits no discharge. Left eye exhibits no discharge. No scleral icterus.  Neck: Neck supple. No tracheal deviation present.  Cardiovascular: Normal rate, regular rhythm and intact distal pulses.   Pulmonary/Chest: Effort normal. No stridor. No respiratory distress. She has decreased breath sounds (bilaterally). She has no wheezes. She has no rales.  Abdominal: Soft. Bowel sounds are normal. She exhibits no distension. There is no tenderness. There is no rebound and no guarding.   Musculoskeletal: She exhibits no edema or tenderness.  Neurological: She is alert. She has normal strength. No cranial nerve deficit (no facial droop, extraocular movements intact, no slurred speech) or sensory deficit. She exhibits normal muscle tone. She displays no seizure activity. Coordination normal.  Skin: Skin is warm and dry. No rash noted.  Psychiatric: She has a normal mood and affect.  Nursing note and vitals reviewed.   ED Course  Procedures (including critical care time) Labs Review Labs Reviewed  BASIC METABOLIC PANEL - Abnormal; Notable for the following:    Potassium 3.2 (*)    Chloride 95 (*)    CO2 35 (*)    Glucose, Bld 199 (*)    Creatinine, Ser 1.07 (*)    Calcium 8.3 (*)    GFR calc non Af Amer 54 (*)    All other components within normal limits  CBC - Abnormal; Notable for the following:    WBC 10.7 (*)    Hemoglobin 10.7 (*)    HCT 34.9 (*)    All other components within normal limits  BRAIN NATRIURETIC PEPTIDE - Abnormal; Notable for the following:    B Natriuretic Peptide 270.0 (*)    All other components within normal limits  TROPONIN I    Imaging Review Dg Chest 2 View  03/30/2015  CLINICAL DATA:  Increased shortness of breath over the past 3 days. Intermittent chronic, nonproductive cough. History of asthma. EXAM: CHEST  2 VIEW COMPARISON:  02/20/2015. FINDINGS: The cardiac silhouette remains mildly enlarged. The pulmonary vasculature and interstitial markings remain mildly prominent. The lungs are hyperexpanded with flattening of the hemidiaphragms on the lateral view. No pleural fluid. Bilateral AC joint degenerative changes and mild thoracic spine degenerative changes. IMPRESSION: 1. No acute abnormality. 2. Stable cardiomegaly, mild pulmonary vascular congestion and mild changes of COPD. Electronically Signed   By: Claudie Revering M.D.   On: 03/30/2015 17:19  I have personally reviewed and evaluated these images and lab results as part of my  medical decision-making.   EKG Interpretation   Date/Time:  Thursday March 30 2015 16:39:53 EDT Ventricular Rate:  80 PR Interval:    QRS Duration: 88 QT Interval:  636 QTC Calculation: 734 R Axis:   -124 Text Interpretation:  Atrial fibrillation Abnormal lateral Q waves  Anteroseptal infarct, age indeterminate Prolonged QT interval Poor data  quality No significant change since last tracing Confirmed by Jefferie Holston  MD-J,  Mykaylah Ballman [id number]) on 03/30/2015 4:46:16 PM      MDM   Final diagnoses:  COPD exacerbation (HCC)  Anemia, unspecified anemia type  Hyperglycemia    Pt has chronic lab abnormalities including increased bicarb, decreased chloride and glucose that are stable.  Chronic anemia that is unchanged.  BNP is not significantly elevated.  Doubt CHF.  Suspect her dyspnea is related to her COPD.    She is not tahcypneic or hypoxic.  Do not feel she requires admission.  Will dc home with medications for COPD exacerbation.  Close follow up with PCP.  Monitor blood sugars with her steroid use.    Dorie Rank, MD 03/30/15 [phone]

## 2015-04-03 DIAGNOSIS — Z79891 Long term (current) use of opiate analgesic: Secondary | ICD-10-CM | POA: Diagnosis not present

## 2015-04-03 DIAGNOSIS — M25512 Pain in left shoulder: Secondary | ICD-10-CM | POA: Diagnosis not present

## 2015-04-03 DIAGNOSIS — M25511 Pain in right shoulder: Secondary | ICD-10-CM | POA: Diagnosis not present

## 2015-04-03 DIAGNOSIS — M545 Low back pain: Secondary | ICD-10-CM | POA: Diagnosis not present

## 2015-04-03 DIAGNOSIS — G8929 Other chronic pain: Secondary | ICD-10-CM | POA: Diagnosis not present

## 2015-04-03 DIAGNOSIS — M542 Cervicalgia: Secondary | ICD-10-CM | POA: Diagnosis not present

## 2015-04-04 ENCOUNTER — Encounter: Payer: Self-pay | Admitting: Adult Health

## 2015-04-04 ENCOUNTER — Ambulatory Visit (INDEPENDENT_AMBULATORY_CARE_PROVIDER_SITE_OTHER): Payer: Medicare Other | Admitting: Adult Health

## 2015-04-04 VITALS — BP 124/80 | HR 56 | Temp 98.7°F | Ht 59.0 in | Wt 213.0 lb

## 2015-04-04 DIAGNOSIS — J45909 Unspecified asthma, uncomplicated: Secondary | ICD-10-CM

## 2015-04-04 DIAGNOSIS — J9611 Chronic respiratory failure with hypoxia: Secondary | ICD-10-CM

## 2015-04-04 DIAGNOSIS — G4733 Obstructive sleep apnea (adult) (pediatric): Secondary | ICD-10-CM | POA: Diagnosis not present

## 2015-04-04 DIAGNOSIS — J449 Chronic obstructive pulmonary disease, unspecified: Secondary | ICD-10-CM | POA: Diagnosis not present

## 2015-04-04 DIAGNOSIS — I5032 Chronic diastolic (congestive) heart failure: Secondary | ICD-10-CM

## 2015-04-04 NOTE — Addendum Note (Signed)
Addended by: Osa Craver on: 04/04/2015 03:10 PM   Modules accepted: Orders

## 2015-04-04 NOTE — Progress Notes (Signed)
   Subjective:    Patient ID: Denise Freeman, female    DOB: 10-Apr-1951, 64 y.o.   MRN: [id number]  HPI 52 female former smoker with COPD , OSA and Chronic hypoxic /hypercarbic RF with OHS   TESTS: PSG 1991 Echo 05/30/14 >> EF 45 to 50% PFT 10/24/14 >> FEV1 0.64 (30%), FEV1% 65, TLC 2.92 (64%), DLCO 54%, + BD   04/04/2015 Follow up : OSA and COPD  Pt returns for follow up for COPD and sleep apnea.  She is on BIPAP At bedtime  With oxygen  Says she wears it everyday At bedtime  And with naps.  No mask issues. No daytime sleepiness but feels tired  On Provilgil daily   Recent COPD flare in ER . Given prednisone , did not help much  CXR with no acute process  She is on Budesonide neb Twice daily  And Duoneb Four times a day  .  On oxygen 3l/m .  PFT in may this year showed very severe COPD with asthma component.  Flu shot is utd.  She gets very sob of breath with minimal activity . She is very deconditioned.  She denies fever, orthopnea,, increased edema , hemoptysis or chest pain.       Review of Systems Constitutional:   No  weight loss, night sweats,  Fevers, chills,  +fatigue, or  lassitude.  HEENT:   No headaches,  Difficulty swallowing,  Tooth/dental problems, or  Sore throat,                No sneezing, itching, ear ache,  +nasal congestion, post nasal drip,   CV:  No chest pain,  Orthopnea, PND, swelling in lower extremities, anasarca, dizziness, palpitations, syncope.   GI  No heartburn, indigestion, abdominal pain, nausea, vomiting, diarrhea, change in bowel habits, loss of appetite, bloody stools.   Resp:    No chest wall deformity  Skin: no rash or lesions.  GU: no dysuria, change in color of urine, no urgency or frequency.  No flank pain, no hematuria   MS:  No joint pain or swelling.  No decreased range of motion.  No back pain.  Psych:  No change in mood or affect. No depression or anxiety.  No memory loss.         Objective:   Physical Exam  GEN:  A/Ox3; pleasant , NAD, morbidly obese , scooter.   HEENT:  [place]/AT,  EACs-clear, TMs-wnl, NOSE-clear, THROAT-clear, no lesions, no postnasal drip or exudate noted.  Class 2-3 airway   NECK:  Supple w/ fair ROM; no JVD; normal carotid impulses w/o bruits; no thyromegaly or nodules palpated; no lymphadenopathy.  RESP  Decreased BS in bases no accessory muscle use, no dullness to percussion  CARD:  RRR, no m/r/g  , tr  peripheral edema, pulses intact, no cyanosis or clubbing.  GI:   Soft & nt; nml bowel sounds; no organomegaly or masses detected.  Musco: Warm bil, no deformities or joint swelling noted.   Neuro: alert, no focal deficits noted.    Skin: Warm, no lesions or rashes       Assessment & Plan:

## 2015-04-04 NOTE — Assessment & Plan Note (Signed)
Cont on BIPAP  Download requested Wt loss

## 2015-04-04 NOTE — Assessment & Plan Note (Signed)
Appears compensated without flare  Cont on current regimen

## 2015-04-04 NOTE — Addendum Note (Signed)
Addended by: Osa Craver on: 04/04/2015 03:08 PM   Modules accepted: Orders

## 2015-04-04 NOTE — Assessment & Plan Note (Signed)
Recent flare now resolving  Pt has very severe COPD w/ asthma component along with OHS and D CHF  She is very deconditioned which all contribute to her dyspnea.   Plan  continue on Budesonide Neb Twice daily   Continue on Duoneb Four times a day   Wear BIPAP At bedtime   Work on weight loss  Refer to pulmonary rehab.  Follow up with Dr. Halford Chessman  In 4 months and As needed

## 2015-04-04 NOTE — Assessment & Plan Note (Signed)
Cont on O2 .  

## 2015-04-04 NOTE — Progress Notes (Signed)
Reviewed and agree with assessment/plan. 

## 2015-04-04 NOTE — Patient Instructions (Addendum)
Continue on Budesonide Neb Twice daily   Continue on Duoneb Four times a day   Wear BIPAP At bedtime   Work on weight loss  Refer to pulmonary rehab.  Follow up with Dr. Halford Chessman  In 4 months and As needed

## 2015-04-06 ENCOUNTER — Other Ambulatory Visit: Payer: Self-pay | Admitting: Advanced Practice Midwife

## 2015-04-11 ENCOUNTER — Other Ambulatory Visit (HOSPITAL_COMMUNITY)
Admission: RE | Admit: 2015-04-11 | Discharge: 2015-04-11 | Disposition: A | Discharge status: Home / Self Care | Payer: Medicare Other | Source: Ambulatory Visit | Attending: Advanced Practice Midwife | Admitting: Advanced Practice Midwife

## 2015-04-11 ENCOUNTER — Encounter: Payer: Self-pay | Admitting: Advanced Practice Midwife

## 2015-04-11 ENCOUNTER — Ambulatory Visit (INDEPENDENT_AMBULATORY_CARE_PROVIDER_SITE_OTHER): Payer: Medicare Other | Admitting: Advanced Practice Midwife

## 2015-04-11 VITALS — BP 80/40 | HR 74 | Ht 59.0 in | Wt 217.0 lb

## 2015-04-11 DIAGNOSIS — Z124 Encounter for screening for malignant neoplasm of cervix: Secondary | ICD-10-CM

## 2015-04-11 DIAGNOSIS — Z1151 Encounter for screening for human papillomavirus (HPV): Secondary | ICD-10-CM | POA: Insufficient documentation

## 2015-04-11 DIAGNOSIS — Z01419 Encounter for gynecological examination (general) (routine) without abnormal findings: Secondary | ICD-10-CM | POA: Diagnosis not present

## 2015-04-11 DIAGNOSIS — Z1212 Encounter for screening for malignant neoplasm of rectum: Secondary | ICD-10-CM

## 2015-04-11 NOTE — Progress Notes (Signed)
Denise Freeman 64 y.o.  Filed Vitals:   04/11/15 1327  BP: 80/40  Pulse: 74     Past Medical History: Past Medical History  Diagnosis Date  . Diabetes mellitus without complication ([place])   . Bipolar 1 disorder ([place])   . Fibromyalgia   . COPD (chronic obstructive pulmonary disease) ([place])   . CHF (congestive heart failure) ([place])   . Asthma   . Atrial fibrillation ([place])   . Neuropathy (HCC)     Disc Back   . Fibromyalgia   . CHF (congestive heart failure) ([place])   . Coronary artery disease   . Anemia   . History of hiatal hernia   . GERD (gastroesophageal reflux disease)   . Shortness of breath dyspnea   . Family history of adverse reaction to anesthesia     Uncle was positive for malignant hyerthermia; patient had testing done and was negative.  . Myocardial infarction ([place]) 1990  . Sleep apnea     uses biPAP, 10  . Arthritis   . HOH (hard of hearing)     Past Surgical History: Past Surgical History  Procedure Laterality Date  . Heel spur surgery Bilateral   . Appendectomy    . Bladder suspension      2003, 2006 and 2010  . Neck surgery N/A 2009    4, 6, and 7 cervical disc replaced  . Bladder stimulator      pt states, "it cannot be turned off".  . Hernia repair      umbilical  . Cataract extraction w/phaco Right 11/29/2014    Procedure: CATARACT EXTRACTION PHACO AND INTRAOCULAR LENS PLACEMENT (IOC);  Surgeon: Rutherford Guys, MD;  Location: [facility];  Service: Ophthalmology;  Laterality: Right;  CDE:3.81  . Cataract extraction w/phaco Left 12/13/2014    Procedure: CATARACT EXTRACTION PHACO AND INTRAOCULAR LENS PLACEMENT (IOC);  Surgeon: Rutherford Guys, MD;  Location: [facility];  Service: Ophthalmology;  Laterality: Left;  CDE:6.59    Family History: Family History  Problem Relation Age of Onset  . Heart disease Mother   . COPD Mother   . Cancer Mother     Breast cancer  . Diabetes Mother   . Heart disease Father   . COPD Sister   . Heart disease Sister   . Diabetes  Sister   . Heart disease Maternal Grandmother     Social History: Social History  Substance Use Topics  . Smoking status: Former Smoker -- 2.00 packs/day for 14 years    Types: Cigarettes    Quit date: 04/12/1989  . Smokeless tobacco: None  . Alcohol Use: No    Allergies:  Allergies  Allergen Reactions  . Ancef [Cefazolin] Nausea And Vomiting  . Augmentin [Amoxicillin-Pot Clavulanate] Nausea Only  . Ciprofloxacin Nausea And Vomiting  . Haldol [Haloperidol] Other (See Comments)    Restless leg  . Levaquin [Levofloxacin In D5w] Other (See Comments)    "afib"  . Lyrica [Pregabalin] Hives  . Nsaids Diarrhea  . Tamiflu [Oseltamivir Phosphate] Other (See Comments)    "water blisters"  . Topiramate Nausea Only  . Zoloft [Sertraline Hcl] Other (See Comments)    Jaw problems      Current outpatient prescriptions:  .  albuterol (PROVENTIL) (2.5 MG/3ML) 0.083% nebulizer solution, Take 2.5 mg by nebulization every 6 (six) hours as needed for wheezing or shortness of breath., Disp: , Rfl:  .  atorvastatin (LIPITOR) 20 MG tablet, Take 1 tablet (20 mg total) by mouth daily., Disp: 30  tablet, Rfl: 1 .  budesonide (PULMICORT) 0.5 MG/2ML nebulizer solution, Take 2 mLs (0.5 mg total) by nebulization 2 (two) times daily. DX J43.8, Disp: 120 mL, Rfl: 6 .  cetirizine (ZYRTEC) 10 MG tablet, Take 10 mg by mouth daily., Disp: , Rfl:  .  diltiazem (CARDIZEM) 30 MG tablet, Take 1 tablet (30 mg total) by mouth 2 (two) times daily., Disp: 180 tablet, Rfl: 1 .  diphenhydrAMINE (BENADRYL) 25 mg capsule, Take 25 mg by mouth every 6 (six) hours as needed for itching or allergies. , Disp: , Rfl:  .  docusate sodium (COLACE) 100 MG capsule, Take 100 mg by mouth daily., Disp: , Rfl:  .  escitalopram (LEXAPRO) 10 MG tablet, Take 10 mg by mouth at bedtime. , Disp: , Rfl:  .  famotidine (PEPCID) 40 MG tablet, Take 20 mg by mouth 2 (two) times daily. , Disp: , Rfl:  .  fenofibrate 54 MG tablet, Take 1 tablet  (54 mg total) by mouth daily., Disp: 90 tablet, Rfl: 1 .  fluticasone (FLONASE) 50 MCG/ACT nasal spray, Place 1 spray into both nostrils daily as needed for allergies or rhinitis. , Disp: , Rfl:  .  furosemide (LASIX) 40 MG tablet, Take 1.5 tablets (60 mg total) by mouth 2 (two) times daily., Disp: 270 tablet, Rfl: 3 .  Insulin Glargine (TOUJEO SOLOSTAR) 300 UNIT/ML SOPN, Inject 80 Units into the skin at bedtime., Disp: 9 pen, Rfl: 1 .  insulin lispro (HUMALOG KWIKPEN) 100 UNIT/ML KiwkPen, Inject 0.22-0.32 mLs (22-32 Units total) into the skin 3 (three) times daily. 20 UNITS TWICE DAILY AND 28 UNITS IN THE EVENING (Patient taking differently: Inject 20-28 Units into the skin 3 (three) times daily. 20 UNITS in the AM and 22 UNITS AT LUNCH AND 28 UNITS IN THE EVENING), Disp: 10 pen, Rfl: 1 .  Insulin Pen Needle (CAREFINE PEN NEEDLES) 32G X 4 MM MISC, Use 4x a day, Disp: 200 each, Rfl: 3 .  metolazone (ZAROXOLYN) 2.5 MG tablet, Take 1 tablet (2.5 mg total) by mouth daily. (Patient taking differently: Take 2.5 mg by mouth at bedtime. ), Disp: 90 tablet, Rfl: 1 .  mirtazapine (REMERON) 15 MG tablet, Take 15 mg by mouth at bedtime., Disp: , Rfl:  .  modafinil (PROVIGIL) 200 MG tablet, Take 200 mg by mouth daily., Disp: , Rfl:  .  nystatin (MYCOSTATIN/NYSTOP) 100000 UNIT/GM POWD, Apply topically 2 (two) times daily. USES UNDERNEATH BREAST, Disp: , Rfl:  .  ondansetron (ZOFRAN-ODT) 4 MG disintegrating tablet, Take 4 mg by mouth every 8 (eight) hours as needed for nausea or vomiting., Disp: , Rfl:  .  oxyCODONE (OXY IR/ROXICODONE) 5 MG immediate release tablet, Take 5 mg by mouth 2 (two) times daily., Disp: , Rfl:  .  OXYGEN, Inhale 3 L into the lungs continuous. 3 liters 24/7, Disp: , Rfl:  .  pantoprazole (PROTONIX) 40 MG tablet, Take 1 tablet (40 mg total) by mouth daily., Disp: 30 tablet, Rfl: 0 .  potassium chloride SA (K-DUR,KLOR-CON) 20 MEQ tablet, Take 20 mEq by mouth 3 (three) times daily. , Disp: ,  Rfl:  .  primidone (MYSOLINE) 50 MG tablet, Take 1 tablet (50 mg total) by mouth at bedtime., Disp: 90 tablet, Rfl: 0 .  rivaroxaban (XARELTO) 20 MG TABS tablet, Take 1 tablet (20 mg total) by mouth daily with supper., Disp: 90 tablet, Rfl: 3  History of Present Ilness: Here for pap test/breast exam. Last pap 2 years ago. Moved to [facility]  about a year ago.  Has an electronic bladder device to help with incontinence, but batteries need changing.  Has missed too many appts at [facility] and they dismissed her.   Last colonoscipy 3 years ago--had a polyp, advised to have repeat at 5 year mark.  PCP is [facility]. Ssees them regularly. Mammogram 3/16 normal.    Review of Systems   Patient denies any headaches, blurred vision, shortness of breath, chest pain, abdominal pain, problems with bowel movements, urination, or intercourse.   Physical Exam: General:  Well developed, well nourished, no acute distress.  Uses continuous 02, but no SOB while talking. Skin:  Warm and dry Neck:  Midline trachea, normal thyroid Lungs; Clear to auscultation bilaterally Breast:  No dominant palpable mass, retraction, or nipple discharge Cardiovascular: Regular rate and rhythm Abdomen:  Soft, non tender Pelvic:  External genitalia is normal in appearance. Moisture changes (incontinence) no evidence of yeast.  The vagina is normal in appearance.  The cervix is bulbous.  Uterus is felt to be normal size, shape, and contour.  No adnexal masses or tenderness noted. Exam greatly limited by body habitus Extremities:  No swelling or varicosities noted Rectum:  hemocult neg Psych:  No mood changes.     Impression: normal GYN exam/limited by body habitus.  Moisture changes     Plan: Pap in 3 years if normal Yearly mammograms Continue Desitin daily [facility] and [employer], [employer] given

## 2015-04-11 NOTE — Patient Instructions (Addendum)
BLADDER:  [facility] [address] [place], [place] [address] Phone: [phone]   COLONOSCOPY:  [facility]  Address: [address], [place], [place] [address] Phone: [phone]

## 2015-04-12 ENCOUNTER — Telehealth: Payer: Self-pay

## 2015-04-12 NOTE — Telephone Encounter (Signed)
LMOM to call.

## 2015-04-12 NOTE — Telephone Encounter (Signed)
Patient called saying that her PCP advised her to get a colonoscopy. She absolutely refuses to drink any for of prep and insists on a pill prep. I told her that we advise against the pill prep, but she told me all the reasons why she can't have a liquid prep. I told her that I would have the triage call her. [phone]  She said if we call and she isn't there to please leave a message and tell your name, number and where call is coming from. I told her we do that with every call and someone would be calling her back.

## 2015-04-13 LAB — CYTOLOGY - PAP

## 2015-04-14 ENCOUNTER — Telehealth (HOSPITAL_COMMUNITY): Payer: Self-pay

## 2015-04-14 NOTE — Telephone Encounter (Signed)
Called Denise Freeman in regards to referral to Pulmonary Rehab at [facility]. Patient lives in [place] and closer to [facility].  She is interested in attending rehab at [facility]. I will send information over to them.

## 2015-04-17 ENCOUNTER — Ambulatory Visit: Payer: Self-pay | Admitting: Gynecology

## 2015-04-17 ENCOUNTER — Telehealth: Payer: Self-pay | Admitting: Internal Medicine

## 2015-04-17 NOTE — Telephone Encounter (Signed)
Thanks Cheryl. 

## 2015-04-17 NOTE — Telephone Encounter (Signed)
Pt c/o of Chest Pain: STAT if CP now or developed within 24 hours  1. Are you having CP right now? No not right now. She said it eased up 3am this morning   2. Are you experiencing any other symptoms (ex. SOB, nausea, vomiting, sweating)? SOB  3. How long have you been experiencing CP? 2 days   4. Is your CP continuous or coming and going? Coming and going   5. Have you taken Nitroglycerin? No  ?

## 2015-04-17 NOTE — Telephone Encounter (Signed)
Returned call to patient she stated she had left sided chest pain,left shoulder pain all day yesterday.Stated no chest pain this morning.Patient refuses appointment with extender.Appointment scheduled with Dr.Hilty 04/21/15 at 8:30 am.Advised to go to ER if she has any more chest pain.

## 2015-04-21 ENCOUNTER — Encounter: Payer: Self-pay | Admitting: Internal Medicine

## 2015-04-21 ENCOUNTER — Ambulatory Visit (INDEPENDENT_AMBULATORY_CARE_PROVIDER_SITE_OTHER): Payer: Medicare Other | Admitting: Internal Medicine

## 2015-04-21 VITALS — BP 118/74 | HR 101 | Ht 59.0 in | Wt 212.8 lb

## 2015-04-21 DIAGNOSIS — I482 Chronic atrial fibrillation, unspecified: Secondary | ICD-10-CM

## 2015-04-21 DIAGNOSIS — G4733 Obstructive sleep apnea (adult) (pediatric): Secondary | ICD-10-CM

## 2015-04-21 DIAGNOSIS — E1365 Other specified diabetes mellitus with hyperglycemia: Secondary | ICD-10-CM

## 2015-04-21 DIAGNOSIS — IMO0002 Reserved for concepts with insufficient information to code with codable children: Secondary | ICD-10-CM

## 2015-04-21 DIAGNOSIS — E1322 Other specified diabetes mellitus with diabetic chronic kidney disease: Secondary | ICD-10-CM

## 2015-04-21 DIAGNOSIS — I5032 Chronic diastolic (congestive) heart failure: Secondary | ICD-10-CM

## 2015-04-21 DIAGNOSIS — J438 Other emphysema: Secondary | ICD-10-CM

## 2015-04-21 DIAGNOSIS — F319 Bipolar disorder, unspecified: Secondary | ICD-10-CM

## 2015-04-21 DIAGNOSIS — N183 Chronic kidney disease, stage 3 unspecified: Secondary | ICD-10-CM

## 2015-04-21 MED ORDER — NITROGLYCERIN 0.4 MG SL SUBL: 0.4000 mg | SUBLINGUAL_TABLET | SUBLINGUAL | Status: DC | PRN

## 2015-04-21 NOTE — Patient Instructions (Signed)
[name] has prescribed nitroglycerin to use as needed for chest pain - you can dissolve 1 tablet under your tongue every 5 minutes as needed for chest pain - MAX 3 doses.   Your physician wants you to follow-up in: 6 months with [name].  You will receive a reminder letter in the mail two months in advance. If you don't receive a letter, please call our office to schedule the follow-up appointment.

## 2015-04-21 NOTE — Progress Notes (Signed)
OFFICE NOTE  Chief Complaint:  A few chest pain episodes  Primary Care Physician: London Pepper, MD  HPI:  Briyah Edminster Bhakta is a pleasant 64 year old female who is establishing cardiac care today. She is accompanied by her husband and they recently moved here from [place], [place]. She has a history of severe COPD on home oxygen. She also has a history of stress-induced cardiomyopathy in the past. She has since had recovery of her EF. She's had numerous cardiac catheterizations. None of which showed obstructive coronary disease. Her last cardiac catheterization was in October 2014 which demonstrated no significant coronary disease. This was after a small abnormality was noted in the apex suggestive of ischemia on a nuclear stress test. She does have a history of permanent atrial fibrillation on Coumadin. She will need to have her INRs followed here. Unfortunate she has not had her INR checked in over 9 weeks and it was assessed today and was low. We will need to adjust her medication. She will be established in our anticoagulation clinic. Blood pressure looks well controlled today. She is on cholesterol medication.  I saw Mrs. Dayal back today in the office. Unfortunate she was hospitalized in December for worsening shortness of breath and probable pneumonia with a mild diastolic heart failure exacerbation. She was given IV diuretics and her Lasix was increased to 40 mg 3 times a day. She's also taking metolazone 3 times weekly. She continues to complain of leg edema which is mostly dependent. She has significant shortness of breath and CO2 retention and an appointment with a pulmonologist is pending.  Mrs. Gardella returned today back in the office. Her main complaints are a number of excoriations and weeping lesions on her face and arms. She apparently is going to see a dermatologist about this. She's also had worsening leg swelling and shortness of breath. Her weight is now up about 8 pounds since  her last visit. She was instructed to take Lasix 40 mg 3 times a day but apparently she is taking it twice a day. She is taking the metolazone 3 times a week. She is wearing compression stockings which I had advised and seems that this helps her swelling.  Mrs. Tabet returns today and reports a big improvement in her leg swelling. She's currently on Lasix 60 mg twice daily and metolazone 3x weekly. Weight is come down and swelling is improved. Her BNP was over 250 and is come down to about 125.  I saw Mrs. Holtmeyer back today in follow-up. She's been successful losing over 15 pounds which I congratulated her on. Her breathing continues to be fairly difficult. She is using BiPAP at night. She's been in the ER a few times for mostly difficulty breathing and COPD exacerbation. She tells me the other day she was in church and had an episode of sharp chest pain in her left anterior chest and left scapular area. She was quite upset and emotional today time. Recently she's been the target of possibly a scam, so that she is under a lot of stress. She was upset that she did not have anything to take for her chest discomfort. I tried to reassure her that her coronary arteries have look normal with multiple catheterizations. She does have some diastolic dysfunction I suspect her LVEDP goes up when she gets upset causing her chest discomfort.  PMHx:  Past Medical History  Diagnosis Date  . Diabetes mellitus without complication ([place])   . Bipolar 1 disorder ([place] [facility])   .  Fibromyalgia   . COPD (chronic obstructive pulmonary disease) ([place])   . CHF (congestive heart failure) ([place])   . Asthma   . Atrial fibrillation ([place])   . Neuropathy ([facility])     Disc Back   . Fibromyalgia   . CHF (congestive heart failure) ([place])   . Coronary artery disease   . Anemia   . History of hiatal hernia   . GERD (gastroesophageal reflux disease)   . Shortness of breath dyspnea   . Family history of adverse reaction to anesthesia      Uncle was positive for malignant hyerthermia; patient had testing done and was negative.  . Myocardial infarction ([place]) 1990  . Sleep apnea     uses biPAP, 10  . Arthritis   . HOH (hard of hearing)     Past Surgical History  Procedure Laterality Date  . Heel spur surgery Bilateral   . Appendectomy    . Bladder suspension      2003, 2006 and 2010  . Neck surgery N/A 2009    4, 6, and 7 cervical disc replaced  . Bladder stimulator      pt states, "it cannot be turned off".  . Hernia repair      umbilical  . Cataract extraction w/phaco Right 11/29/2014    Procedure: CATARACT EXTRACTION PHACO AND INTRAOCULAR LENS PLACEMENT (IOC);  Surgeon: Rutherford Guys, MD;  Location: [facility];  Service: Ophthalmology;  Laterality: Right;  CDE:3.81  . Cataract extraction w/phaco Left 12/13/2014    Procedure: CATARACT EXTRACTION PHACO AND INTRAOCULAR LENS PLACEMENT (IOC);  Surgeon: Rutherford Guys, MD;  Location: [facility];  Service: Ophthalmology;  Laterality: Left;  CDE:6.59    FAMHx:  Family History  Problem Relation Age of Onset  . Heart disease Mother   . COPD Mother   . Cancer Mother     Breast cancer  . Diabetes Mother   . Heart disease Father   . COPD Sister   . Heart disease Sister   . Diabetes Sister   . Heart disease Maternal Grandmother     SOCHx:   reports that she quit smoking about 26 years ago. Her smoking use included Cigarettes. She has a 28 pack-year smoking history. She does not have any smokeless tobacco history on file. She reports that she does not drink alcohol or use illicit drugs.  ALLERGIES:  Allergies  Allergen Reactions  . Ancef [Cefazolin] Nausea And Vomiting  . Augmentin [Amoxicillin-Pot Clavulanate] Nausea Only  . Ciprofloxacin Nausea And Vomiting  . Haldol [Haloperidol] Other (See Comments)    Restless leg  . Levaquin [Levofloxacin In D5w] Other (See Comments)    "afib"  . Lyrica [Pregabalin] Hives  . Nsaids Diarrhea  . Tamiflu [Oseltamivir Phosphate] Other  (See Comments)    "water blisters"  . Topiramate Nausea Only  . Zoloft [Sertraline Hcl] Other (See Comments)    Jaw problems    ROS: A comprehensive review of systems was negative except for: Respiratory: positive for dyspnea on exertion Cardiovascular: positive for chest pressure/discomfort and lower extremity edema Integument/breast: positive for skin lesion(s) Musculoskeletal: positive for myalgias Behavioral/Psych: positive for bipolar  HOME MEDS: Current Outpatient Prescriptions  Medication Sig Dispense Refill  . albuterol (PROVENTIL) (2.5 MG/3ML) 0.083% nebulizer solution Take 2.5 mg by nebulization every 6 (six) hours as needed for wheezing or shortness of breath.    Marland Kitchen atorvastatin (LIPITOR) 20 MG tablet Take 1 tablet (20 mg total) by mouth daily. 30 tablet 1  . budesonide (PULMICORT)  0.5 MG/2ML nebulizer solution Take 2 mLs (0.5 mg total) by nebulization 2 (two) times daily. DX J43.8 120 mL 6  . cetirizine (ZYRTEC) 10 MG tablet Take 10 mg by mouth daily.    Marland Kitchen diltiazem (CARDIZEM) 30 MG tablet Take 1 tablet (30 mg total) by mouth 2 (two) times daily. 180 tablet 1  . diphenhydrAMINE (BENADRYL) 25 mg capsule Take 25 mg by mouth every 6 (six) hours as needed for itching or allergies.     Marland Kitchen docusate sodium (COLACE) 100 MG capsule Take 100 mg by mouth daily.    Marland Kitchen escitalopram (LEXAPRO) 10 MG tablet Take 10 mg by mouth at bedtime.     . famotidine (PEPCID) 40 MG tablet Take 20 mg by mouth 2 (two) times daily.     . fenofibrate 54 MG tablet Take 1 tablet (54 mg total) by mouth daily. 90 tablet 1  . fluticasone (FLONASE) 50 MCG/ACT nasal spray Place 1 spray into both nostrils daily as needed for allergies or rhinitis.     . furosemide (LASIX) 40 MG tablet Take 1.5 tablets (60 mg total) by mouth 2 (two) times daily. 270 tablet 3  . Insulin Glargine (TOUJEO SOLOSTAR) 300 UNIT/ML SOPN Inject 80 Units into the skin at bedtime. 9 pen 1  . insulin lispro (HUMALOG KWIKPEN) 100 UNIT/ML KiwkPen  Inject 0.22-0.32 mLs (22-32 Units total) into the skin 3 (three) times daily. 20 UNITS TWICE DAILY AND 28 UNITS IN THE EVENING (Patient taking differently: Inject 20-28 Units into the skin 3 (three) times daily. 20 UNITS in the AM and 22 UNITS AT LUNCH AND 28 UNITS IN THE EVENING) 10 pen 1  . Insulin Pen Needle (CAREFINE PEN NEEDLES) 32G X 4 MM MISC Use 4x a day 200 each 3  . metolazone (ZAROXOLYN) 2.5 MG tablet Take 1 tablet (2.5 mg total) by mouth daily. (Patient taking differently: Take 2.5 mg by mouth at bedtime. ) 90 tablet 1  . mirtazapine (REMERON) 15 MG tablet Take 15 mg by mouth at bedtime.    . modafinil (PROVIGIL) 200 MG tablet Take 200 mg by mouth daily.    Marland Kitchen nystatin (MYCOSTATIN/NYSTOP) 100000 UNIT/GM POWD Apply topically 2 (two) times daily. USES UNDERNEATH BREAST    . ondansetron (ZOFRAN-ODT) 4 MG disintegrating tablet Take 4 mg by mouth every 8 (eight) hours as needed for nausea or vomiting.    Marland Kitchen oxyCODONE (OXY IR/ROXICODONE) 5 MG immediate release tablet Take 5 mg by mouth 2 (two) times daily.    . OXYGEN Inhale 3 L into the lungs continuous. 3 liters 24/7    . pantoprazole (PROTONIX) 40 MG tablet Take 1 tablet (40 mg total) by mouth daily. 30 tablet 0  . potassium chloride SA (K-DUR,KLOR-CON) 20 MEQ tablet Take 20 mEq by mouth 3 (three) times daily.     . primidone (MYSOLINE) 50 MG tablet Take 1 tablet (50 mg total) by mouth at bedtime. 90 tablet 0  . rivaroxaban (XARELTO) 20 MG TABS tablet Take 1 tablet (20 mg total) by mouth daily with supper. 90 tablet 3  . nitroGLYCERIN (NITROSTAT) 0.4 MG SL tablet Place 1 tablet (0.4 mg total) under the tongue every 5 (five) minutes as needed for chest pain. 25 tablet 3   No current facility-administered medications for this visit.    LABS/IMAGING: No results found for this or any previous visit (from the past 48 hour(s)). No results found.  VITALS: BP 118/74 mmHg  Pulse 101  Ht 4\' 11"  (1.499 m)  Wt 212 lb 12.8 oz (96.525 kg)  BMI  42.96 kg/m2  EXAM: General appearance: alert, appears older than stated age, no distress and In a powered scooter Neck: JVD - 3 cm above sternal notch and no carotid bruit Lungs: diminished breath sounds bilaterally and wheezes bilaterally Heart: irregularly irregular rhythm Abdomen: soft, non-tender; bowel sounds normal; no masses,  no organomegaly and obese Extremities: edema 1+ bilateral, stockings in place Pulses: 2+ and symmetric Skin: Numerous, weeping lesions Neurologic: Grossly normal Psych: Somewhat manic  EKG: Atrial fibrillation with rapid ventricular response at 101  ASSESSMENT: 1. Permanent atrial fibrillation on Xarelto (transitioned from Warfarin) 2. Chronic diastolic congestive heart failure 3. History of stress-induced cardiomyopathy with normalization of her EF 4. No significant obstructive coronary disease after multiple catheterizations in 1996, 99, 2007 and 2014. 5. Fibromyalgia 6. Bipolar 1 disorder 7. Dyslipidemia 8. Hypertension 9. Severe COPD on home oxygen 10. Obstructive sleep apnea on CPAP 11. Morbid obesity - with weight loss recently 12. DNR  PLAN: 1.   Mrs. Rockett has had a few episodes of chest discomfort, mostly when she gets upset. Again she has had numerous cardiac catheterizations with no obstructive coronary disease. I suspect this may be related to diastolic dysfunction and increased LVEDP. She is requesting nitroglycerin to take which I will provide as needed. Otherwise in place to see that she's losing some weight and from a cardiac standpoint seems fairly stable. There is no worsening edema or worsening signs of heart failure. She will likely need a repeat echocardiogram when we see her back in 6 months. Again she does have an active DO NOT RESUSCITATE order on the chart.  Pixie Casino, MD, [facility] Attending Cardiologist Elsie C Hilty 04/21/2015, 8:48 AM

## 2015-04-24 ENCOUNTER — Telehealth: Payer: Self-pay | Admitting: Pulmonary Disease

## 2015-04-24 DIAGNOSIS — G4733 Obstructive sleep apnea (adult) (pediatric): Secondary | ICD-10-CM

## 2015-04-24 NOTE — Telephone Encounter (Signed)
Spoke with pt, states she needs a new bipap mask and headgear supplies.  Pt is wanting to change DME companies-pt unsure of who she wants to switch to.    VS are you ok with this order?  Thanks!

## 2015-04-28 NOTE — Telephone Encounter (Signed)
VS not to return to office until 05/02/15. Will await VS recs

## 2015-05-01 ENCOUNTER — Other Ambulatory Visit: Payer: Self-pay | Admitting: *Deleted

## 2015-05-01 ENCOUNTER — Other Ambulatory Visit: Payer: Self-pay | Admitting: Neurology

## 2015-05-01 DIAGNOSIS — G8929 Other chronic pain: Secondary | ICD-10-CM | POA: Diagnosis not present

## 2015-05-01 DIAGNOSIS — Z79891 Long term (current) use of opiate analgesic: Secondary | ICD-10-CM | POA: Diagnosis not present

## 2015-05-01 DIAGNOSIS — M542 Cervicalgia: Secondary | ICD-10-CM | POA: Diagnosis not present

## 2015-05-01 DIAGNOSIS — M25512 Pain in left shoulder: Secondary | ICD-10-CM | POA: Diagnosis not present

## 2015-05-01 DIAGNOSIS — M545 Low back pain: Secondary | ICD-10-CM | POA: Diagnosis not present

## 2015-05-01 MED ORDER — INSULIN GLARGINE 300 UNIT/ML ~~LOC~~ SOPN: 80.0000 [IU] | PEN_INJECTOR | Freq: Every day | SUBCUTANEOUS | Status: DC

## 2015-05-01 MED ORDER — BUDESONIDE 0.5 MG/2ML IN SUSP: 0.5000 mg | Freq: Two times a day (BID) | RESPIRATORY_TRACT | Status: DC

## 2015-05-01 MED ORDER — INSULIN PEN NEEDLE 32G X 4 MM MISC: Status: DC

## 2015-05-01 MED ORDER — ALBUTEROL SULFATE (2.5 MG/3ML) 0.083% IN NEBU: 2.5000 mg | INHALATION_SOLUTION | Freq: Four times a day (QID) | RESPIRATORY_TRACT | Status: DC | PRN

## 2015-05-01 MED ORDER — MONTELUKAST SODIUM 10 MG PO TABS: 10.0000 mg | ORAL_TABLET | Freq: Every day | ORAL | Status: DC

## 2015-05-01 MED ORDER — INSULIN LISPRO 100 UNIT/ML (KWIKPEN): 22.0000 [IU] | PEN_INJECTOR | Freq: Three times a day (TID) | SUBCUTANEOUS | Status: DC

## 2015-05-01 MED ORDER — PRIMIDONE 50 MG PO TABS: 50.0000 mg | ORAL_TABLET | Freq: Every day | ORAL | Status: DC

## 2015-05-01 NOTE — Telephone Encounter (Signed)
Okay to send refills. 

## 2015-05-01 NOTE — Telephone Encounter (Signed)
Spoke with pt. Aware okay to refill the medications. [facility]. Nothing further needed

## 2015-05-01 NOTE — Telephone Encounter (Signed)
Awaiting Dr. Juanetta Gosling recommendations.

## 2015-05-01 NOTE — Telephone Encounter (Signed)
Please send order to set her up with new DME for BiPAP supplies, and arrange for BiPAP mask refitting.

## 2015-05-01 NOTE — Telephone Encounter (Signed)
Patient notified of BiPAP order. Patient says that [facility] brought her a mask that fits well, she does not want the Mask Fitting --------------------------------  Patient says that she needs her Budesonide, Albuterol, Singular sent as 90 day supply to [facility]. (did not see Singulair on pt's med list, but she read the label from her bottle and she is taking Montelukast 10mg  daily)  Dr. Halford Chessman, please advise if ok to refill 90 days on all of these meds.

## 2015-05-01 NOTE — Telephone Encounter (Signed)
Primidone refill requested. Per last office note- patient to remain on medication. Refill approved and sent to patient's pharmacy.   

## 2015-05-02 ENCOUNTER — Telehealth: Payer: Self-pay | Admitting: Internal Medicine

## 2015-05-02 ENCOUNTER — Other Ambulatory Visit: Payer: Self-pay | Admitting: *Deleted

## 2015-05-02 ENCOUNTER — Other Ambulatory Visit: Payer: Self-pay

## 2015-05-02 MED ORDER — INSULIN PEN NEEDLE 32G X 4 MM MISC: Status: DC

## 2015-05-02 MED ORDER — INSULIN LISPRO 100 UNIT/ML (KWIKPEN): PEN_INJECTOR | SUBCUTANEOUS | Status: DC

## 2015-05-02 MED ORDER — FUROSEMIDE 40 MG PO TABS: 60.0000 mg | ORAL_TABLET | Freq: Two times a day (BID) | ORAL | Status: DC

## 2015-05-02 MED ORDER — METOLAZONE 2.5 MG PO TABS: 2.5000 mg | ORAL_TABLET | Freq: Every day | ORAL | Status: DC

## 2015-05-02 MED ORDER — RIVAROXABAN 20 MG PO TABS: 20.0000 mg | ORAL_TABLET | Freq: Every day | ORAL | Status: DC

## 2015-05-02 MED ORDER — DILTIAZEM HCL 30 MG PO TABS: 30.0000 mg | ORAL_TABLET | Freq: Two times a day (BID) | ORAL | Status: DC

## 2015-05-02 NOTE — Telephone Encounter (Signed)
Received a call from [place] with [facility] requesting 90 day refills for xarelto 20 mg,furosemide 40 mg,metalozone 2.5 mg,diltiazem 30 mg.Refills sent to pharmacy.

## 2015-05-02 NOTE — Telephone Encounter (Signed)
Denise [facility] called stated that patient need a 90 day supply of BD pen needles and solostar pen needles.

## 2015-05-02 NOTE — Telephone Encounter (Signed)
Insulin dosage clarification for [facility].

## 2015-05-02 NOTE — Telephone Encounter (Signed)
Pen needles sent to [facility]. Insulin, Toujeo Solostar and Humalog sent already.

## 2015-05-02 NOTE — Telephone Encounter (Signed)
Pt called in stating that she is switching pharmacies and she will now need her meds faxed to [place].    *STAT* If patient is at the pharmacy, call can be transferred to refill team.   1. Which medications need to be refilled? (please list name of each medication and dose if known) Metolazone,Diltiazem, Klor-Con,Xarleto,Furosemide  2. Which pharmacy/location (including street and city if local pharmacy) is medication to be sent to?[facility]   3. Do they need a 30 day or 90 day supply? [place]

## 2015-05-03 ENCOUNTER — Encounter (HOSPITAL_COMMUNITY): Payer: Medicare Other

## 2015-05-03 NOTE — Telephone Encounter (Signed)
LMOM for a return call. Looks like she will need an OV since she is on Xarelto and pain meds. She will need to have PCP send referral.

## 2015-05-08 MED ORDER — METOLAZONE 2.5 MG PO TABS: 2.5000 mg | ORAL_TABLET | Freq: Every day | ORAL | Status: DC

## 2015-05-08 MED ORDER — RIVAROXABAN 20 MG PO TABS: 20.0000 mg | ORAL_TABLET | Freq: Every day | ORAL | Status: DC

## 2015-05-08 MED ORDER — DILTIAZEM HCL 30 MG PO TABS: 30.0000 mg | ORAL_TABLET | Freq: Two times a day (BID) | ORAL | Status: DC

## 2015-05-08 MED ORDER — POTASSIUM CHLORIDE CRYS ER 20 MEQ PO TBCR: 20.0000 meq | EXTENDED_RELEASE_TABLET | Freq: Three times a day (TID) | ORAL | Status: DC

## 2015-05-08 MED ORDER — FUROSEMIDE 40 MG PO TABS: 60.0000 mg | ORAL_TABLET | Freq: Two times a day (BID) | ORAL | Status: DC

## 2015-05-08 NOTE — Telephone Encounter (Signed)
Rx(s) sent to pharmacy electronically.  

## 2015-05-09 ENCOUNTER — Ambulatory Visit: Payer: Medicare Other | Admitting: Internal Medicine

## 2015-05-09 DIAGNOSIS — F329 Major depressive disorder, single episode, unspecified: Secondary | ICD-10-CM | POA: Diagnosis not present

## 2015-05-10 ENCOUNTER — Telehealth: Payer: Self-pay | Admitting: Pulmonary Disease

## 2015-05-10 ENCOUNTER — Encounter (HOSPITAL_COMMUNITY): Payer: Medicare Other

## 2015-05-10 NOTE — Telephone Encounter (Signed)
Called and spoke with pt Pt wanted to make sure that a 90 day supply was sent to [facility] for her budesonide, singulair and albuterol nebulizer  Informed pt that [facility]/01/2015 Informed pt to call her pharmacy and if not received to call office back Pt voiced understanding  Nothing further is needed at this time.

## 2015-05-15 ENCOUNTER — Telehealth: Payer: Self-pay | Admitting: Internal Medicine

## 2015-05-15 MED ORDER — FENOFIBRATE 54 MG PO TABS: 54.0000 mg | ORAL_TABLET | Freq: Every day | ORAL | Status: DC

## 2015-05-15 MED ORDER — INSULIN LISPRO 100 UNIT/ML (KWIKPEN): PEN_INJECTOR | SUBCUTANEOUS | Status: DC

## 2015-05-15 MED ORDER — INSULIN PEN NEEDLE 32G X 4 MM MISC: Status: DC

## 2015-05-15 MED ORDER — INSULIN GLARGINE 300 UNIT/ML ~~LOC~~ SOPN: 80.0000 [IU] | PEN_INJECTOR | Freq: Every day | SUBCUTANEOUS | Status: DC

## 2015-05-15 NOTE — Telephone Encounter (Signed)
[facility] stated [facility]'t get enough information she need, Insulin Pen Needle 32G X 4 MM MISC, fenofibrate 54 MG tablet,  insulin lispro (HUMALOG KWIKPEN) 100 UNIT/ML KiwkPen, Insulin Glargine (TOUJEO SOLOSTAR) 300 UNIT/ML SOPN,  [facility] [phone] press 3 for fax, any question call patient.

## 2015-05-15 NOTE — Telephone Encounter (Signed)
Refills sent to [facility].

## 2015-05-16 ENCOUNTER — Encounter (HOSPITAL_COMMUNITY): Payer: Medicare Other

## 2015-05-19 ENCOUNTER — Telehealth: Payer: Self-pay | Admitting: Pulmonary Disease

## 2015-05-19 MED ORDER — MODAFINIL 200 MG PO TABS: 200.0000 mg | ORAL_TABLET | Freq: Every day | ORAL | Status: DC

## 2015-05-19 NOTE — Telephone Encounter (Signed)
Okay to send refill for provigil.

## 2015-05-19 NOTE — Telephone Encounter (Signed)
Called spoke with pt. She is needing her provigil 200 mg refilled. This has never been refilled by our office. Pt reports she was getting this from her pulm in [place] but will no longer fill this. She contacted her PCP and was told they needed to call us for the refill [facility]. Please advise Dr. Halford Chessman on refill  thanks

## 2015-05-19 NOTE — Telephone Encounter (Signed)
[facility]ed [facility] to call in med refill, was advised that all controlled substances have to be escribed or faxed to this pharmacy, cannot be called in any longer.  rx printed and faxed to fax # on file for [facility].  Nothing further needed.

## 2015-05-25 DIAGNOSIS — G8929 Other chronic pain: Secondary | ICD-10-CM | POA: Diagnosis not present

## 2015-05-25 DIAGNOSIS — G890 Central pain syndrome: Secondary | ICD-10-CM | POA: Diagnosis not present

## 2015-05-25 DIAGNOSIS — M545 Low back pain: Secondary | ICD-10-CM | POA: Diagnosis not present

## 2015-05-25 DIAGNOSIS — G894 Chronic pain syndrome: Secondary | ICD-10-CM | POA: Diagnosis not present

## 2015-05-25 DIAGNOSIS — Z79891 Long term (current) use of opiate analgesic: Secondary | ICD-10-CM | POA: Diagnosis not present

## 2015-06-01 ENCOUNTER — Other Ambulatory Visit: Payer: Self-pay | Admitting: Internal Medicine

## 2015-06-01 MED ORDER — FUROSEMIDE 40 MG PO TABS: 60.0000 mg | ORAL_TABLET | Freq: Two times a day (BID) | ORAL | Status: DC

## 2015-06-04 DIAGNOSIS — Z9289 Personal history of other medical treatment: Secondary | ICD-10-CM

## 2015-06-04 HISTORY — DX: Personal history of other medical treatment: Z92.89

## 2015-06-07 ENCOUNTER — Emergency Department (HOSPITAL_COMMUNITY)
Admission: EM | Admit: 2015-06-07 | Discharge: 2015-06-07 | Disposition: A | Discharge status: Home / Self Care | Payer: Medicare Other | Attending: Emergency Medicine | Admitting: Emergency Medicine

## 2015-06-07 ENCOUNTER — Encounter (HOSPITAL_COMMUNITY): Payer: Self-pay | Admitting: *Deleted

## 2015-06-07 DIAGNOSIS — I2510 Atherosclerotic heart disease of native coronary artery without angina pectoris: Secondary | ICD-10-CM | POA: Diagnosis not present

## 2015-06-07 DIAGNOSIS — Z7901 Long term (current) use of anticoagulants: Secondary | ICD-10-CM | POA: Diagnosis not present

## 2015-06-07 DIAGNOSIS — M7989 Other specified soft tissue disorders: Secondary | ICD-10-CM | POA: Diagnosis not present

## 2015-06-07 DIAGNOSIS — M1990 Unspecified osteoarthritis, unspecified site: Secondary | ICD-10-CM | POA: Diagnosis not present

## 2015-06-07 DIAGNOSIS — Z79899 Other long term (current) drug therapy: Secondary | ICD-10-CM | POA: Diagnosis not present

## 2015-06-07 DIAGNOSIS — H9190 Unspecified hearing loss, unspecified ear: Secondary | ICD-10-CM | POA: Diagnosis not present

## 2015-06-07 DIAGNOSIS — I509 Heart failure, unspecified: Secondary | ICD-10-CM | POA: Insufficient documentation

## 2015-06-07 DIAGNOSIS — Z862 Personal history of diseases of the blood and blood-forming organs and certain disorders involving the immune mechanism: Secondary | ICD-10-CM | POA: Insufficient documentation

## 2015-06-07 DIAGNOSIS — I252 Old myocardial infarction: Secondary | ICD-10-CM | POA: Insufficient documentation

## 2015-06-07 DIAGNOSIS — F319 Bipolar disorder, unspecified: Secondary | ICD-10-CM | POA: Diagnosis not present

## 2015-06-07 DIAGNOSIS — Z794 Long term (current) use of insulin: Secondary | ICD-10-CM | POA: Diagnosis not present

## 2015-06-07 DIAGNOSIS — M79644 Pain in right finger(s): Secondary | ICD-10-CM

## 2015-06-07 DIAGNOSIS — J449 Chronic obstructive pulmonary disease, unspecified: Secondary | ICD-10-CM | POA: Insufficient documentation

## 2015-06-07 DIAGNOSIS — G8929 Other chronic pain: Secondary | ICD-10-CM | POA: Insufficient documentation

## 2015-06-07 DIAGNOSIS — Z87891 Personal history of nicotine dependence: Secondary | ICD-10-CM | POA: Insufficient documentation

## 2015-06-07 DIAGNOSIS — L539 Erythematous condition, unspecified: Secondary | ICD-10-CM | POA: Diagnosis not present

## 2015-06-07 DIAGNOSIS — K219 Gastro-esophageal reflux disease without esophagitis: Secondary | ICD-10-CM | POA: Diagnosis not present

## 2015-06-07 DIAGNOSIS — E119 Type 2 diabetes mellitus without complications: Secondary | ICD-10-CM | POA: Insufficient documentation

## 2015-06-07 DIAGNOSIS — G4730 Sleep apnea, unspecified: Secondary | ICD-10-CM | POA: Insufficient documentation

## 2015-06-07 DIAGNOSIS — L608 Other nail disorders: Secondary | ICD-10-CM | POA: Diagnosis present

## 2015-06-07 DIAGNOSIS — M79645 Pain in left finger(s): Secondary | ICD-10-CM | POA: Diagnosis not present

## 2015-06-07 MED ORDER — SULFAMETHOXAZOLE-TRIMETHOPRIM 800-160 MG PO TABS
1.0000 | ORAL_TABLET | Freq: Two times a day (BID) | ORAL | Status: AC
Start: 2015-06-07 — End: 2015-06-14

## 2015-06-07 MED ORDER — OXYCODONE-ACETAMINOPHEN 5-325 MG PO TABS
2.0000 | ORAL_TABLET | Freq: Once | ORAL | Status: DC
  Filled 2015-06-07: qty 2

## 2015-06-07 NOTE — ED Notes (Signed)
Pt states she clipped her finger nail too far back 4 days ago. Pt denies any other problems. NAD noted.

## 2015-06-07 NOTE — ED Notes (Signed)
In to give patient ordered medication, patient started yelling that she can not take that and someone things which were incomprehensible. I told her she didn't have to take them that was her right and that I would send Almyra Free back into her room to speak to her. Patient then came to nursing station in her wheelchair yelling and hollering, I told the patient she needed to go back into her because we have other patients she was disturbing and that she could not stand in the middle of the hallway yelling. She then went further down the hallway, still refusing to go into her room, still yelling. Security called Patient left ED on her way out she threatened myself "just wait til I see you". "Ill make sure you get fired", "youre gonna get it".

## 2015-06-07 NOTE — Discharge Instructions (Signed)
Continue using warm water soaks for 15 minutes 3-4 times daily.  Start taking your antibiotic today.  Make sure you take 2 doses today, although today's doses will be closer together then your remaining 9 days.  Follow-up with your doctor for recheck in 2 days if your symptoms are not starting to improve or if he develops worse pain, swelling, spreading redness.  There is no drainable abscess on your exam today.

## 2015-06-08 ENCOUNTER — Ambulatory Visit (INDEPENDENT_AMBULATORY_CARE_PROVIDER_SITE_OTHER): Payer: Medicare Other | Admitting: Neurology

## 2015-06-08 ENCOUNTER — Encounter: Payer: Self-pay | Admitting: Neurology

## 2015-06-08 VITALS — BP 138/70 | HR 92

## 2015-06-08 DIAGNOSIS — E1142 Type 2 diabetes mellitus with diabetic polyneuropathy: Secondary | ICD-10-CM

## 2015-06-08 DIAGNOSIS — G251 Drug-induced tremor: Secondary | ICD-10-CM

## 2015-06-08 MED ORDER — PRIMIDONE 50 MG PO TABS: 50.0000 mg | ORAL_TABLET | Freq: Every day | ORAL | Status: DC

## 2015-06-08 NOTE — ED Provider Notes (Signed)
CSN: [id number]     Arrival date & time 06/07/15  1103 History   First MD Initiated Contact with Patient 06/07/15 1124     Chief Complaint  Patient presents with  . Nail Problem     (Consider location/radiation/quality/duration/timing/severity/associated sxs/prior Treatment) The history is provided by the patient.   Denise Freeman is a 65 y.o. female with a history multiple medical problems as outlined below, most significant for DM and chronic pain in chronic pain management in [place] presenting with pain and swelling of her left distal thumb.  She reports she had a partially broken nail which was caught on her bed covers, bending the broken portion backward and causing severe pain and her need to clip the nail very short against the nail bed.  Since this occurred she has had increased pain and swelling of the thumb.  She has used her home oxycodone 5 mg which she is allowed bid with no improvement in her symptoms.  She reports there has been clear yellow drainage which dries along the nail edge and is concerned for infection.  She is desirous of having her finger "opened up" to relieve the pain.     Past Medical History  Diagnosis Date  . Diabetes mellitus without complication ([place])   . Bipolar 1 disorder ([place])   . Fibromyalgia   . COPD (chronic obstructive pulmonary disease) ([place])   . CHF (congestive heart failure) ([place])   . Asthma   . Atrial fibrillation ([place])   . Neuropathy (HCC)     Disc Back   . Fibromyalgia   . CHF (congestive heart failure) ([place])   . Coronary artery disease   . Anemia   . History of hiatal hernia   . GERD (gastroesophageal reflux disease)   . Shortness of breath dyspnea   . Family history of adverse reaction to anesthesia     Uncle was positive for malignant hyerthermia; patient had testing done and was negative.  . Myocardial infarction ([place]) 1990  . Sleep apnea     uses biPAP, 10  . Arthritis   . HOH (hard of hearing)    Past Surgical History    Procedure Laterality Date  . Heel spur surgery Bilateral   . Appendectomy    . Bladder suspension      2003, 2006 and 2010  . Neck surgery N/A 2009    4, 6, and 7 cervical disc replaced  . Bladder stimulator      pt states, "it cannot be turned off".  . Hernia repair      umbilical  . Cataract extraction w/phaco Right 11/29/2014    Procedure: CATARACT EXTRACTION PHACO AND INTRAOCULAR LENS PLACEMENT (IOC);  Surgeon: Rutherford Guys, MD;  Location: [facility];  Service: Ophthalmology;  Laterality: Right;  CDE:3.81  . Cataract extraction w/phaco Left 12/13/2014    Procedure: CATARACT EXTRACTION PHACO AND INTRAOCULAR LENS PLACEMENT (IOC);  Surgeon: Rutherford Guys, MD;  Location: [facility];  Service: Ophthalmology;  Laterality: Left;  CDE:6.59   Family History  Problem Relation Age of Onset  . Heart disease Mother   . COPD Mother   . Cancer Mother     Breast cancer  . Diabetes Mother   . Heart disease Father   . COPD Sister   . Heart disease Sister   . Diabetes Sister   . Heart disease Maternal Grandmother    Social History  Substance Use Topics  . Smoking status: Former Smoker -- 2.00 packs/day for 14 years  Types: Cigarettes    Quit date: 04/12/1989  . Smokeless tobacco: None  . Alcohol Use: No   OB History    No data available     Review of Systems  Constitutional: Negative for fever.  Musculoskeletal: Positive for arthralgias. Negative for myalgias and joint swelling.  Skin: Positive for color change.  Neurological: Negative for weakness and numbness.      Allergies  Ancef; Augmentin; Ciprofloxacin; Haldol; Levaquin; Lyrica; Nsaids; Tamiflu; Topiramate; and Zoloft  Home Medications   Prior to Admission medications   Medication Sig Start Date End Date Taking? Authorizing Provider  albuterol (PROVENTIL) (2.5 MG/3ML) 0.083% nebulizer solution Take 3 mLs (2.5 mg total) by nebulization every 6 (six) hours as needed for wheezing or shortness of breath. 05/01/15   Chesley Mires,  MD  atorvastatin (LIPITOR) 20 MG tablet Take 1 tablet (20 mg total) by mouth daily. 06/02/14   Thurnell Lose, MD  budesonide (PULMICORT) 0.5 MG/2ML nebulizer solution Take 2 mLs (0.5 mg total) by nebulization 2 (two) times daily. DX J43.8 05/01/15   Chesley Mires, MD  cetirizine (ZYRTEC) 10 MG tablet Take 10 mg by mouth daily.    Historical Provider, MD  diltiazem (CARDIZEM) 30 MG tablet Take 1 tablet (30 mg total) by mouth 2 (two) times daily. 05/08/15   Pixie Casino, MD  diphenhydrAMINE (BENADRYL) 25 mg capsule Take 25 mg by mouth every 6 (six) hours as needed for itching or allergies.     Historical Provider, MD  docusate sodium (COLACE) 100 MG capsule Take 100 mg by mouth daily.    Historical Provider, MD  escitalopram (LEXAPRO) 10 MG tablet Take 10 mg by mouth at bedtime.     Historical Provider, MD  famotidine (PEPCID) 40 MG tablet Take 20 mg by mouth 2 (two) times daily.     Historical Provider, MD  fenofibrate 54 MG tablet Take 1 tablet (54 mg total) by mouth daily. 05/15/15   Philemon Kingdom, MD  fluticasone (FLONASE) 50 MCG/ACT nasal spray Place 1 spray into both nostrils daily as needed for allergies or rhinitis.     Historical Provider, MD  furosemide (LASIX) 40 MG tablet Take 1.5 tablets (60 mg total) by mouth 2 (two) times daily. 06/01/15   Pixie Casino, MD  Insulin Glargine (TOUJEO SOLOSTAR) 300 UNIT/ML SOPN Inject 80 Units into the skin at bedtime. 05/15/15   Philemon Kingdom, MD  insulin lispro (HUMALOG KWIKPEN) 100 UNIT/ML KiwkPen INJECT 20 UNITS INTO TWICE DAILY AND 28 UNITS IN THE EVENING. 05/15/15   Philemon Kingdom, MD  Insulin Pen Needle (BD PEN NEEDLE NANO U/F) 32G X 4 MM MISC USE TO INJECT INSULIN 4 TIMES DAILY. 05/15/15   Philemon Kingdom, MD  Insulin Pen Needle 32G X 4 MM MISC Use to inject insulin 4 times daily. 05/02/15   Philemon Kingdom, MD  metolazone (ZAROXOLYN) 2.5 MG tablet Take 1 tablet (2.5 mg total) by mouth daily. 05/08/15   Pixie Casino, MD    mirtazapine (REMERON) 15 MG tablet Take 15 mg by mouth at bedtime.    Historical Provider, MD  modafinil (PROVIGIL) 200 MG tablet Take 1 tablet (200 mg total) by mouth daily. 05/19/15   Chesley Mires, MD  montelukast (SINGULAIR) 10 MG tablet Take 1 tablet (10 mg total) by mouth at bedtime. 05/01/15   Chesley Mires, MD  nitroGLYCERIN (NITROSTAT) 0.4 MG SL tablet Place 1 tablet (0.4 mg total) under the tongue every 5 (five) minutes as needed for chest pain. 04/21/15  Pixie Casino, MD  nystatin (MYCOSTATIN/NYSTOP) 100000 UNIT/GM POWD Apply topically 2 (two) times daily. USES UNDERNEATH BREAST    Historical Provider, MD  ondansetron (ZOFRAN-ODT) 4 MG disintegrating tablet Take 4 mg by mouth every 8 (eight) hours as needed for nausea or vomiting.    Historical Provider, MD  oxyCODONE (OXY IR/ROXICODONE) 5 MG immediate release tablet Take 5 mg by mouth 2 (two) times daily.    Historical Provider, MD  OXYGEN Inhale 3 L into the lungs continuous. 3 liters 24/7    Historical Provider, MD  pantoprazole (PROTONIX) 40 MG tablet Take 1 tablet (40 mg total) by mouth daily. 06/02/14   Thurnell Lose, MD  potassium chloride SA (K-DUR,KLOR-CON) 20 MEQ tablet Take 1 tablet (20 mEq total) by mouth 3 (three) times daily. 05/08/15   Pixie Casino, MD  primidone (MYSOLINE) 50 MG tablet Take 1 tablet (50 mg total) by mouth at bedtime. 05/01/15   Eustace Quail Tat, DO  rivaroxaban (XARELTO) 20 MG TABS tablet Take 1 tablet (20 mg total) by mouth daily with supper. 05/08/15   Pixie Casino, MD  sulfamethoxazole-trimethoprim (BACTRIM DS,SEPTRA DS) 800-160 MG tablet Take 1 tablet by mouth 2 (two) times daily. 06/07/15 06/14/15  Evalee Jefferson, PA-C   BP 126/57 mmHg  Pulse 95  Temp(Src) 98.4 F (36.9 C) (Oral)  Resp 18  Ht 4\' 11"  (1.499 m)  Wt 94.802 kg  BMI 42.19 kg/m2  SpO2 100% Physical Exam  Constitutional: She appears well-developed and well-nourished.  HENT:  Head: Atraumatic.  Neck: Normal range of motion.   Cardiovascular:  Pulses equal bilaterally  Musculoskeletal: She exhibits edema and tenderness.       Hands: Neurological: She is alert. She has normal strength. She displays normal reflexes. No sensory deficit.  Skin: Skin is warm and dry. There is erythema.  Psychiatric: She has a normal mood and affect.    ED Course  Procedures (including critical care time) Labs Review Labs Reviewed - No data to display  Imaging Review No results found. I have personally reviewed and evaluated these images and lab results as part of my medical decision-making.   EKG Interpretation None      MDM   Final diagnoses:  Finger pain, right    Discussed with patient that I agree she would benefit from antibiotics and continued warm soaks which she has been doing, I suspect she has an early infection but no drainable pus pocket or abscess.  This procedure would not help her and may only serve to worsen her pain.  She became very angry and vocal and disagreed with my plan.  I advised her I would order some pain medicine for her to take right now and would ask Dr. Stark Jock to examine her as well.  Upon return to room with Dr. Stark Jock, pt was not present, as I discovered she was escorted out when she came out and started yelling at my nurse in the hallway and refused to cooperate with return to her room.    Pt next had conversation with nursing admin, Dr. Stark Jock examined her finger in the waiting room, agreed with my initial assessment.  She was prescribed bactrim, advised continued warm soaks and f/u with pcp if sx persist or worsen.    Evalee Jefferson, PA-C 06/08/15 [place], MD 06/08/15 1620

## 2015-06-08 NOTE — Addendum Note (Signed)
Addended byAnnamaria Helling on: 06/08/2015 02:21 PM   Modules accepted: Orders

## 2015-06-08 NOTE — Progress Notes (Signed)
Denise Freeman was seen today in the movement disorders clinic for neurologic consultation at the request of MORROW, Marjory Lies, MD.  The consultation is for the evaluation of tremor.  The records that were made available to me were reviewed.  Pt does have a hx of bipolar d/o but is not on medication specifically for this.  Med hx review does not reveal any exposure to antipsychotics.    Pt reports that tremor 3 years ago, but has gotten worse.  It started in both hands and has continued in both hands.  No leg tremor.  She is on albuterol/ipratropium nebulizer.  She uses the albuterol nebulizer form 4 times per day.  She estimates that she has been on that for a year.  She doesn't think that the nebulizer affects her tremor.  There is a fam hx of tremor in her mother.  02/28/15 update:  The patient is following up today. I reviewed her PCP records.   When I saw her in June, she was describing myoclonus and I suspected that was from gabapentin, but I also told her that I did not recommend gabapentin and Lyrica together and she was on both.  She really did not want to alter the medications, but tells me today that she did get off of the Lyrica in early August.  Despite that, she continues to have some of these movements.   She is still on gabapentin but thinks that she is on it twice per day.  She is unsure (called the pharmacy later and it appears that she is on 300 mg 3 times per day, down from 900 mg 3 times per day).  She reports that she has been on cymbalta previously for neuropathy and it caused hallucinations.  She did have some recent lab work done and her hemoglobin A1c was very elevated at 11.7 in August and in April it was 8.3.  She has both tremor and myoclonus and previously reported that she was not bothered by tremor, but now states that she is.  She asks me if there is some medication that could help both her neuropathy as well as her tremor, meanwhile asking me if she could get off of the  gabapentin.  03/23/15 update:  The patient is seen today in follow-up.  Last visit, I started her on Topamax.  She got up to 75 mg daily and stated that it caused itchiness and GI upset.    While I told her this would be a very unusual side effect, she felt pretty convinced that it was the medication.  She discontinued it 2 days ago.  Strangely, it did work well.  She admits that the tremor is starting to come back.  She admits that she still has GI upset despite going off of the topamax.  Only ate a banana today and that barely feels settled.  Diabetes is more out of control and getting some blood sugar readings as high as 600. Now that she is off of the gabapentin, the myoclonus went away completely.    06/08/15 update:  The patient is following up today.  I have reviewed records available to me since last visit, both from pulmonary as well as from cardiology.  The patient has a history of essential tremor, which has been worsened by nebulizer use.  We cautiously started her on primidone last visit, knowing that it could have a potential interaction with Xarelto.  The patient understood the implications of this.  She states that she is doing well in terms of tremor of tremor unless she gets upset and then it "gets aggravating."  She also has a history of peripheral neuropathy, but has had myoclonus with gabapentin in the past.  When asked about this, she has trouble focusing and tells me about her knee pain.  ALLERGIES:   Allergies  Allergen Reactions  . Ancef [Cefazolin] Nausea And Vomiting  . Augmentin [Amoxicillin-Pot Clavulanate] Nausea Only  . Ciprofloxacin Nausea And Vomiting  . Haldol [Haloperidol] Other (See Comments)    Restless leg  . Levaquin [Levofloxacin In D5w] Other (See Comments)    "afib"  . Lyrica [Pregabalin] Hives  . Nsaids Diarrhea  . Tamiflu [Oseltamivir Phosphate] Other (See Comments)    "water blisters"  . Topiramate Nausea Only  . Zoloft [Sertraline Hcl] Other (See  Comments)    Jaw problems    CURRENT MEDICATIONS:  Outpatient Encounter Prescriptions as of 06/08/2015  Medication Sig  . albuterol (PROVENTIL) (2.5 MG/3ML) 0.083% nebulizer solution Take 3 mLs (2.5 mg total) by nebulization every 6 (six) hours as needed for wheezing or shortness of breath.  Marland Kitchen atorvastatin (LIPITOR) 20 MG tablet Take 1 tablet (20 mg total) by mouth daily.  . budesonide (PULMICORT) 0.5 MG/2ML nebulizer solution Take 2 mLs (0.5 mg total) by nebulization 2 (two) times daily. DX J43.8  . cetirizine (ZYRTEC) 10 MG tablet Take 10 mg by mouth daily.  Marland Kitchen diltiazem (CARDIZEM) 30 MG tablet Take 1 tablet (30 mg total) by mouth 2 (two) times daily.  . diphenhydrAMINE (BENADRYL) 25 mg capsule Take 25 mg by mouth every 6 (six) hours as needed for itching or allergies.   Marland Kitchen docusate sodium (COLACE) 100 MG capsule Take 100 mg by mouth daily.  Marland Kitchen escitalopram (LEXAPRO) 10 MG tablet Take 10 mg by mouth at bedtime.   . famotidine (PEPCID) 40 MG tablet Take 20 mg by mouth 2 (two) times daily.   . fenofibrate 54 MG tablet Take 1 tablet (54 mg total) by mouth daily.  . fluticasone (FLONASE) 50 MCG/ACT nasal spray Place 1 spray into both nostrils daily as needed for allergies or rhinitis.   . furosemide (LASIX) 40 MG tablet Take 1.5 tablets (60 mg total) by mouth 2 (two) times daily.  . Insulin Glargine (TOUJEO SOLOSTAR) 300 UNIT/ML SOPN Inject 80 Units into the skin at bedtime.  . insulin lispro (HUMALOG KWIKPEN) 100 UNIT/ML KiwkPen INJECT 20 UNITS INTO TWICE DAILY AND 28 UNITS IN THE EVENING.  . Insulin Pen Needle (BD PEN NEEDLE NANO U/F) 32G X 4 MM MISC USE TO INJECT INSULIN 4 TIMES DAILY.  Marland Kitchen Insulin Pen Needle 32G X 4 MM MISC Use to inject insulin 4 times daily.  . metolazone (ZAROXOLYN) 2.5 MG tablet Take 1 tablet (2.5 mg total) by mouth daily.  . mirtazapine (REMERON) 15 MG tablet Take 15 mg by mouth at bedtime.  . modafinil (PROVIGIL) 200 MG tablet Take 1 tablet (200 mg total) by mouth daily.    . montelukast (SINGULAIR) 10 MG tablet Take 1 tablet (10 mg total) by mouth at bedtime.  Marland Kitchen nystatin (MYCOSTATIN/NYSTOP) 100000 UNIT/GM POWD Apply topically 2 (two) times daily. USES UNDERNEATH BREAST  . ondansetron (ZOFRAN-ODT) 4 MG disintegrating tablet Take 4 mg by mouth every 8 (eight) hours as needed for nausea or vomiting.  Marland Kitchen oxyCODONE (OXY IR/ROXICODONE) 5 MG immediate release tablet Take 5 mg by mouth 2 (two) times daily.  . OXYGEN Inhale 3 L into the lungs continuous. 3  liters 24/7  . pantoprazole (PROTONIX) 40 MG tablet Take 1 tablet (40 mg total) by mouth daily.  . potassium chloride SA (K-DUR,KLOR-CON) 20 MEQ tablet Take 1 tablet (20 mEq total) by mouth 3 (three) times daily.  . primidone (MYSOLINE) 50 MG tablet Take 1 tablet (50 mg total) by mouth at bedtime.  . rivaroxaban (XARELTO) 20 MG TABS tablet Take 1 tablet (20 mg total) by mouth daily with supper.  . sulfamethoxazole-trimethoprim (BACTRIM DS,SEPTRA DS) 800-160 MG tablet Take 1 tablet by mouth 2 (two) times daily.  . nitroGLYCERIN (NITROSTAT) 0.4 MG SL tablet Place 1 tablet (0.4 mg total) under the tongue every 5 (five) minutes as needed for chest pain. (Patient not taking: Reported on 06/08/2015)   No facility-administered encounter medications on file as of 06/08/2015.    PAST MEDICAL HISTORY:   Past Medical History  Diagnosis Date  . Diabetes mellitus without complication ([place])   . Bipolar 1 disorder ([place])   . Fibromyalgia   . COPD (chronic obstructive pulmonary disease) ([place])   . CHF (congestive heart failure) ([place])   . Asthma   . Atrial fibrillation ([place])   . Neuropathy (H[facility])     Disc Back   . Fibromyalgia   . CHF (congestive heart failure) ([place])   . Coronary artery disease   . Anemia   . History of hiatal hernia   . GERD (gastroesophageal reflux disease)   . Shortness of breath dyspnea   . Family history of adverse reaction to anesthesia     Uncle was positive for malignant hyerthermia; patient had testing  done and was negative.  . Myocardial infarction ([place]) 1990  . Sleep apnea     uses biPAP, 10  . Arthritis   . HOH (hard of hearing)     PAST SURGICAL HISTORY:   Past Surgical History  Procedure Laterality Date  . Heel spur surgery Bilateral   . Appendectomy    . Bladder suspension      2003, 2006 and 2010  . Neck surgery N/A 2009    4, 6, and 7 cervical disc replaced  . Bladder stimulator      pt states, "it cannot be turned off".  . Hernia repair      umbilical  . Cataract extraction w/phaco Right 11/29/2014    Procedure: CATARACT EXTRACTION PHACO AND INTRAOCULAR LENS PLACEMENT (IOC);  Surgeon: Rutherford Guys, MD;  Location: [facility];  Service: Ophthalmology;  Laterality: Right;  CDE:3.81  . Cataract extraction w/phaco Left 12/13/2014    Procedure: CATARACT EXTRACTION PHACO AND INTRAOCULAR LENS PLACEMENT (IOC);  Surgeon: Rutherford Guys, MD;  Location: [facility];  Service: Ophthalmology;  Laterality: Left;  CDE:6.59    SOCIAL HISTORY:   Social History   Social History  . Marital Status: Married    Spouse Name: N/A  . Number of Children: N/A  . Years of Education: N/A   Occupational History  . house wife    Social History Main Topics  . Smoking status: Former Smoker -- 2.00 packs/day for 14 years    Types: Cigarettes    Quit date: 04/12/1989  . Smokeless tobacco: Not on file  . Alcohol Use: No  . Drug Use: No  . Sexual Activity: No   Other Topics Concern  . Not on file   Social History Narrative   Married   Disabled   1 child; 41 yrs   6 pregnancies   1 child       FAMILY HISTORY:  Family Status  Relation Status Death Age  . Mother Deceased 52    heart disease, COPD, breast cancer  . Father Deceased 69    heart disease  . Sister Deceased     100  . Maternal Grandmother Deceased 74  . Maternal Grandfather Deceased 68  . Paternal Grandmother Deceased   . Paternal Grandfather Deceased     ROS:  A complete 10 system review of systems was obtained and was  unremarkable apart from what is mentioned above.  PHYSICAL EXAMINATION:    VITALS:   Filed Vitals:   06/08/15 1314  BP: 138/70  Pulse: 92    GEN:  The patient appears stated age and is in NAD.  She is very verbose and has difficulty staying focused HEENT:  Normocephalic, atraumatic.  The mucous membranes are moist. The superficial temporal arteries are without ropiness or tenderness. CV:  RRR Lungs:  CTAB.  She is wearing O2.   Neck/HEME:  There are no carotid bruits bilaterally.  Neurological examination:  Orientation: The patient is alert and oriented x3.  Cranial nerves: There is good facial symmetry.  The speech is fluent and clear.  She does have mild vocal tremor.   Soft palate rises symmetrically and there is no tongue deviation. Hearing is intact to conversational tone. Sensation: Sensation is intact to light touch throughout Motor: Strength is 5/5 in the bilateral upper and lower extremities.   Shoulder shrug is equal and symmetric.  There is no pronator drift.   Movement examination: Tone: There is normal tone in the bilateral upper extremities.  The tone in the lower extremities is normal.  Abnormal movements: There is virtually no tremor today and also no myoclonus Coordination:  There is no decremation with RAM's, with any form or rapid alternating movements, including alternating supination and pronation of the forearm, hand opening and closing, finger taps, heel taps and toe taps. Gait and Station: Not tested today  LABS  No results found for: TSH    Chemistry      Component Value Date/Time   NA 139 03/30/2015 1632   K 3.2* 03/30/2015 1632   CL 95* 03/30/2015 1632   CO2 35* 03/30/2015 1632   BUN 14 03/30/2015 1632   CREATININE 1.07* 03/30/2015 1632      Component Value Date/Time   CALCIUM 8.3* 03/30/2015 1632   ALKPHOS 98 02/20/2015 1813   AST 17 02/20/2015 1813   ALT 9* 02/20/2015 1813   BILITOT 0.5 02/20/2015 1813     Lab Results  Component Value  Date   WBC 10.7* 03/30/2015   HGB 10.7* 03/30/2015   HCT 34.9* 03/30/2015   MCV 85.7 03/30/2015   PLT 354 03/30/2015     ASSESSMENT/PLAN:  1.  Tremor  -suspect that she may have essential tremor (fam hx of tremor) that has been made worse by qid nebulizer usage with ipratropium/albuterol.  Tremor was actually virtually resolved.  She is happy with the primidone and stated that she only noted tremor when she was upset.  I told her that I did not want to increase the drug just for this especially since it interacts with the xarelto.  She will continue on the currently primidone 50 mg daily.  Risks, benefits, side effects and alternative therapies were discussed.  The opportunity to ask questions was given and they were answered to the best of my ability.  The patient expressed understanding and willingness to follow the outlined treatment protocols.  2. Myoclonus  -Completely gone  now that she is off of gabapentin.  3.  Peripheral neuropathy, diabetic  - Gabapentin caused myoclonus.  She has been on Lyrica in the past and insists it caused sores, and even if it didn't it is very similar to gabapentin and can contribute to myoclonus.  She had hallucinations with Cymbalta..  We could use one of the old tricyclics if we have to, but she didn't appear to be too bothered by this today.  Had significant difficulty focusing on topics today.  4.  Follow up is anticipated in the next 6 months, sooner should new neurologic issues arise.  Much greater than 50% of this visit was spent in counseling with the patient.  Total face to face time:  25 min

## 2015-06-12 DIAGNOSIS — R0602 Shortness of breath: Secondary | ICD-10-CM | POA: Diagnosis not present

## 2015-06-12 DIAGNOSIS — J069 Acute upper respiratory infection, unspecified: Secondary | ICD-10-CM | POA: Diagnosis not present

## 2015-06-12 DIAGNOSIS — L03011 Cellulitis of right finger: Secondary | ICD-10-CM | POA: Diagnosis not present

## 2015-06-12 DIAGNOSIS — B9789 Other viral agents as the cause of diseases classified elsewhere: Secondary | ICD-10-CM | POA: Diagnosis not present

## 2015-06-13 ENCOUNTER — Ambulatory Visit (HOSPITAL_BASED_OUTPATIENT_CLINIC_OR_DEPARTMENT_OTHER)
Admission: RE | Admit: 2015-06-13 | Discharge: 2015-06-13 | Disposition: A | Discharge status: Home / Self Care | Payer: Medicare Other | Source: Ambulatory Visit | Attending: Orthopedic Surgery | Admitting: Orthopedic Surgery

## 2015-06-13 ENCOUNTER — Other Ambulatory Visit: Payer: Self-pay | Admitting: Orthopedic Surgery

## 2015-06-13 ENCOUNTER — Encounter (HOSPITAL_BASED_OUTPATIENT_CLINIC_OR_DEPARTMENT_OTHER)
Admission: RE | Disposition: A | Discharge status: Home / Self Care | Payer: Self-pay | Source: Ambulatory Visit | Attending: Orthopedic Surgery

## 2015-06-13 ENCOUNTER — Encounter (HOSPITAL_BASED_OUTPATIENT_CLINIC_OR_DEPARTMENT_OTHER): Payer: Self-pay | Admitting: Orthopedic Surgery

## 2015-06-13 DIAGNOSIS — I4891 Unspecified atrial fibrillation: Secondary | ICD-10-CM | POA: Diagnosis not present

## 2015-06-13 DIAGNOSIS — I252 Old myocardial infarction: Secondary | ICD-10-CM | POA: Diagnosis not present

## 2015-06-13 DIAGNOSIS — Z833 Family history of diabetes mellitus: Secondary | ICD-10-CM | POA: Diagnosis not present

## 2015-06-13 DIAGNOSIS — Z8249 Family history of ischemic heart disease and other diseases of the circulatory system: Secondary | ICD-10-CM | POA: Diagnosis not present

## 2015-06-13 DIAGNOSIS — F319 Bipolar disorder, unspecified: Secondary | ICD-10-CM | POA: Insufficient documentation

## 2015-06-13 DIAGNOSIS — I2510 Atherosclerotic heart disease of native coronary artery without angina pectoris: Secondary | ICD-10-CM | POA: Diagnosis not present

## 2015-06-13 DIAGNOSIS — K219 Gastro-esophageal reflux disease without esophagitis: Secondary | ICD-10-CM | POA: Insufficient documentation

## 2015-06-13 DIAGNOSIS — J449 Chronic obstructive pulmonary disease, unspecified: Secondary | ICD-10-CM | POA: Diagnosis not present

## 2015-06-13 DIAGNOSIS — E119 Type 2 diabetes mellitus without complications: Secondary | ICD-10-CM | POA: Diagnosis not present

## 2015-06-13 DIAGNOSIS — Z87891 Personal history of nicotine dependence: Secondary | ICD-10-CM | POA: Diagnosis not present

## 2015-06-13 DIAGNOSIS — I509 Heart failure, unspecified: Secondary | ICD-10-CM | POA: Insufficient documentation

## 2015-06-13 DIAGNOSIS — L03011 Cellulitis of right finger: Secondary | ICD-10-CM | POA: Insufficient documentation

## 2015-06-13 DIAGNOSIS — Z79899 Other long term (current) drug therapy: Secondary | ICD-10-CM | POA: Insufficient documentation

## 2015-06-13 DIAGNOSIS — J45909 Unspecified asthma, uncomplicated: Secondary | ICD-10-CM | POA: Insufficient documentation

## 2015-06-13 HISTORY — PX: I & D EXTREMITY: SHX5045

## 2015-06-13 SURGERY — MINOR IRRIGATION AND DEBRIDEMENT EXTREMITY
Anesthesia: LOCAL | Site: Hand | Laterality: Right

## 2015-06-13 MED ORDER — SULFAMETHOXAZOLE-TRIMETHOPRIM 400-80 MG PO TABS: 1.0000 | ORAL_TABLET | Freq: Two times a day (BID) | ORAL | Status: DC

## 2015-06-13 MED ORDER — HYDROCODONE-ACETAMINOPHEN 5-325 MG PO TABS: 1.0000 | ORAL_TABLET | Freq: Four times a day (QID) | ORAL | Status: DC | PRN

## 2015-06-13 MED ORDER — BUPIVACAINE HCL 0.25 % IJ SOLN
INTRAMUSCULAR | Status: DC | PRN
  Administered 2015-06-13: 4 mL

## 2015-06-13 MED ORDER — LIDOCAINE HCL 1 % IJ SOLN
INTRAMUSCULAR | Status: DC | PRN
  Administered 2015-06-13: 4 mL

## 2015-06-13 SURGICAL SUPPLY — 42 items
BAG DECANTER FOR FLEXI CONT (MISCELLANEOUS) IMPLANT
BLADE MINI RND TIP GREEN BEAV (BLADE) IMPLANT
BLADE SURG 15 STRL LF DISP TIS (BLADE) ×2 IMPLANT
BLADE SURG 15 STRL SS (BLADE) ×1
BNDG COHESIVE 1X5 TAN STRL LF (GAUZE/BANDAGES/DRESSINGS) ×3 IMPLANT
BNDG COHESIVE 3X5 TAN STRL LF (GAUZE/BANDAGES/DRESSINGS) IMPLANT
BNDG ESMARK 4X9 LF (GAUZE/BANDAGES/DRESSINGS) IMPLANT
BNDG GAUZE ELAST 4 BULKY (GAUZE/BANDAGES/DRESSINGS) IMPLANT
CHLORAPREP W/TINT 26ML (MISCELLANEOUS) ×3 IMPLANT
CORDS BIPOLAR (ELECTRODE) IMPLANT
COVER MAYO STAND STRL (DRAPES) ×3 IMPLANT
CUFF TOURNIQUET SINGLE 18IN (TOURNIQUET CUFF) IMPLANT
DRAIN PENROSE 1/2X12 LTX STRL (WOUND CARE) IMPLANT
DRAIN PENROSE 1/4X12 LTX STRL (WOUND CARE) IMPLANT
DRAPE SURG 17X23 STRL (DRAPES) ×3 IMPLANT
GAUZE PACKING IODOFORM 1/4X15 (GAUZE/BANDAGES/DRESSINGS) ×3 IMPLANT
GAUZE SPONGE 4X4 12PLY STRL (GAUZE/BANDAGES/DRESSINGS) ×3 IMPLANT
GAUZE XEROFORM 1X8 LF (GAUZE/BANDAGES/DRESSINGS) ×3 IMPLANT
GLOVE BIOGEL PI IND STRL 7.0 (GLOVE) ×4 IMPLANT
GLOVE BIOGEL PI IND STRL 8.5 (GLOVE) ×4 IMPLANT
GLOVE BIOGEL PI INDICATOR 7.0 (GLOVE) ×2
GLOVE BIOGEL PI INDICATOR 8.5 (GLOVE) ×2
GLOVE ECLIPSE 6.5 STRL STRAW (GLOVE) ×3 IMPLANT
GLOVE SURG ORTHO 8.0 STRL STRW (GLOVE) ×3 IMPLANT
LOOP VESSEL MAXI BLUE (MISCELLANEOUS) IMPLANT
NEEDLE PRECISIONGLIDE 27X1.5 (NEEDLE) ×3 IMPLANT
NS IRRIG 1000ML POUR BTL (IV SOLUTION) ×3 IMPLANT
PACK BASIN DAY SURGERY FS (CUSTOM PROCEDURE TRAY) ×3 IMPLANT
PAD CAST 3X4 CTTN HI CHSV (CAST SUPPLIES) IMPLANT
PADDING CAST ABS 4INX4YD NS (CAST SUPPLIES) ×1
PADDING CAST ABS COTTON 4X4 ST (CAST SUPPLIES) ×2 IMPLANT
PADDING CAST COTTON 3X4 STRL (CAST SUPPLIES)
SPLINT PLASTER CAST XFAST 3X15 (CAST SUPPLIES) IMPLANT
SPLINT PLASTER XTRA FASTSET 3X (CAST SUPPLIES)
SUT ETHILON 4 0 PS 2 18 (SUTURE) ×3 IMPLANT
SWAB COLLECTION DEVICE MRSA (MISCELLANEOUS) IMPLANT
SWAB CULTURE ESWAB REG 1ML (MISCELLANEOUS) ×3 IMPLANT
SYR BULB 3OZ (MISCELLANEOUS) ×3 IMPLANT
SYR CONTROL 10ML LL (SYRINGE) ×3 IMPLANT
TOWEL OR 17X24 6PK STRL BLUE (TOWEL DISPOSABLE) ×6 IMPLANT
TUBE FEEDING 5FR 15 INCH (TUBING) IMPLANT
UNDERPAD 30X30 (UNDERPADS AND DIAPERS) ×3 IMPLANT

## 2015-06-13 NOTE — Op Note (Signed)
Dictation Number [id number]

## 2015-06-13 NOTE — Discharge Instructions (Signed)

## 2015-06-13 NOTE — Brief Op Note (Signed)
06/13/2015  12:43 PM  PATIENT:  Denise Freeman  65 y.o. female  PRE-OPERATIVE DIAGNOSIS:  Paronychia of the thumb on right  POST-OPERATIVE DIAGNOSIS:  Paronychia of the thumb on right  PROCEDURE:  Procedure(s): MINOR INCISION AND DRAINAGE OF ABSCESS (Right)  SURGEON:  Surgeon(s) and Role:    * Daryll Brod, MD - Primary  PHYSICIAN ASSISTANT:   ASSISTANTS: none   ANESTHESIA:   local  EBL:     BLOOD ADMINISTERED:none  DRAINS: none   LOCAL MEDICATIONS USED:  BUPIVICAINE  and XYLOCAINE   SPECIMEN:  Source of Specimen:  cultures  DISPOSITION OF SPECIMEN:  PATHOLOGY  COUNTS:  YES  TOURNIQUET:  * No tourniquets in log *  DICTATION: .Other Dictation: Dictation Number [id number]  PLAN OF CARE: Discharge to home after PACU  PATIENT DISPOSITION:  PACU - hemodynamically stable.

## 2015-06-13 NOTE — Op Note (Signed)
NAMECLOTHILDE, TORIZ NO.:  [id number]  MEDICAL RECORD NO.:  [id number]  LOCATION:                                 FACILITY:  PHYSICIAN:  Daryll Brod, M.D.       DATE OF BIRTH:  [birth year]  DATE OF PROCEDURE:  06/13/2015 DATE OF DISCHARGE:                              OPERATIVE REPORT   PREOPERATIVE DIAGNOSIS:  Paronychia, right thumb.  POSTOPERATIVE DIAGNOSIS:  Paronychia, right thumb.  OPERATION:  Incision and drainage, removal of nail plate; right thumb.  SURGEON:  Daryll Brod, MD.  ANESTHESIA:  Metacarpal block.  HISTORY:  The patient is a 65 year old female with a history of a paronychia 23 days old.  She was seen at [facility] and placed on an antibiotic.  She requested apparently drainage at that time, was simply placed on an antibiotic, was subsequently seen at [facility] and referred.  She is seen with an obvious paronychia with approximately one-half of the nail plate floating in plus.  She has elected to undergo surgical incision and drainage, removal of nail plate.  Pre, peri, and postoperative courses have been discussed along with risks and complications.  She is aware the possibility of continued infection; injury to arteries, nerves, tendons, deformity of the nail plate on regrowth.  In the preoperative area, the patient is seen, the extremity marked by both patient and surgeon.  PROCEDURE IN DETAIL:  The patient was brought to the operating room, where she was prepped using ChloraPrep, supine position with the right arm free.  A metacarpal block was given prior to the prep after time-out taken confirming the patient and procedure.  Block was done with 0.25% bupivacaine, 1% Xylocaine both without epinephrine, 8 mL was used. Pressure was maintained due to her being on Xarelto.  The thumb was exsanguinated from the IP joint proximally with a gauze sponge.  A Penrose drain was used for tourniquet control at the base of the  finger. The nail plate was removed without difficulty.  A small incision made in the nail matrix, purulent material was immediately encountered.  This was cultured for both aerobic and anaerobic cultures.  The wound was irrigated.  The nail plate was then separated and packed with iodoform gauze.  Pressure maintained for 5 minutes.  A sterile compressive dressing and splint to the thumb applied.  Removal of the Penrose drain was confirmed.  The patient was taken to the recovery room.  She will be discharged home to return to the [facility] in 3 days on Vicodin, Bactrim, Septra DS.         ______________________________ Daryll Brod, M.D.    GK/MEDQ  D:  06/13/2015  T:  06/13/2015  Job:  [id number]

## 2015-06-13 NOTE — H&P (Signed)
Denise Freeman is an 65 y.o. female.   Chief Complaint: infection right thumb HPI: 65 yo RH dominant female with 10 history of infection right thumb.  she was seen at [facility] ER 7days ago and placed on bactrim. She was seen at [facility] and referred for care.   Past Medical History  Diagnosis Date  . Diabetes mellitus without complication ([place])   . Bipolar 1 disorder ([place])   . Fibromyalgia   . COPD (chronic obstructive pulmonary disease) ([place])   . CHF (congestive heart failure) ([place])   . Asthma   . Atrial fibrillation ([place])   . Neuropathy ([facility])     Disc Back   . Fibromyalgia   . CHF (congestive heart failure) ([place])   . Coronary artery disease   . Anemia   . History of hiatal hernia   . GERD (gastroesophageal reflux disease)   . Shortness of breath dyspnea   . Family history of adverse reaction to anesthesia     Uncle was positive for malignant hyerthermia; patient had testing done and was negative.  . Myocardial infarction ([place]) 1990  . Sleep apnea     uses biPAP, 10  . Arthritis   . HOH (hard of hearing)     Past Surgical History  Procedure Laterality Date  . Heel spur surgery Bilateral   . Appendectomy    . Bladder suspension      2003, 2006 and 2010  . Neck surgery N/A 2009    4, 6, and 7 cervical disc replaced  . Bladder stimulator      pt states, "it cannot be turned off".  . Hernia repair      umbilical  . Cataract extraction w/phaco Right 11/29/2014    Procedure: CATARACT EXTRACTION PHACO AND INTRAOCULAR LENS PLACEMENT (IOC);  Surgeon: Rutherford Guys, MD;  Location: [facility];  Service: Ophthalmology;  Laterality: Right;  CDE:3.81  . Cataract extraction w/phaco Left 12/13/2014    Procedure: CATARACT EXTRACTION PHACO AND INTRAOCULAR LENS PLACEMENT (IOC);  Surgeon: Rutherford Guys, MD;  Location: [facility];  Service: Ophthalmology;  Laterality: Left;  CDE:6.59    Family History  Problem Relation Age of Onset  . Heart disease Mother   . COPD Mother   .  Cancer Mother     Breast cancer  . Diabetes Mother   . Heart disease Father   . COPD Sister   . Heart disease Sister   . Diabetes Sister   . Heart disease Maternal Grandmother    Social History:  reports that she quit smoking about 26 years ago. Her smoking use included Cigarettes. She has a 28 pack-year smoking history. She does not have any smokeless tobacco history on file. She reports that she does not drink alcohol or use illicit drugs.  Allergies:  Allergies  Allergen Reactions  . Ancef [Cefazolin] Nausea And Vomiting  . Augmentin [Amoxicillin-Pot Clavulanate] Nausea Only  . Ciprofloxacin Nausea And Vomiting  . Haldol [Haloperidol] Other (See Comments)    Restless leg  . Levaquin [Levofloxacin In D5w] Other (See Comments)    "afib"  . Lyrica [Pregabalin] Hives  . Nsaids Diarrhea  . Tamiflu [Oseltamivir Phosphate] Other (See Comments)    "water blisters"  . Topiramate Nausea Only  . Zoloft [Sertraline Hcl] Other (See Comments)    Jaw problems    Medications Prior to Admission  Medication Sig Dispense Refill  . albuterol (PROVENTIL) (2.5 MG/3ML) 0.083% nebulizer solution Take 3 mLs (2.5 mg total) by nebulization  every 6 (six) hours as needed for wheezing or shortness of breath. 1080 mL 0  . atorvastatin (LIPITOR) 20 MG tablet Take 1 tablet (20 mg total) by mouth daily. 30 tablet 1  . budesonide (PULMICORT) 0.5 MG/2ML nebulizer solution Take 2 mLs (0.5 mg total) by nebulization 2 (two) times daily. DX J43.8 360 mL 0  . cetirizine (ZYRTEC) 10 MG tablet Take 10 mg by mouth daily.    Marland Kitchen diltiazem (CARDIZEM) 30 MG tablet Take 1 tablet (30 mg total) by mouth 2 (two) times daily. 180 tablet 3  . diphenhydrAMINE (BENADRYL) 25 mg capsule Take 25 mg by mouth every 6 (six) hours as needed for itching or allergies.     Marland Kitchen docusate sodium (COLACE) 100 MG capsule Take 100 mg by mouth daily.    Marland Kitchen escitalopram (LEXAPRO) 10 MG tablet Take 10 mg by mouth at bedtime.     . famotidine (PEPCID)  40 MG tablet Take 20 mg by mouth 2 (two) times daily.     . fenofibrate 54 MG tablet Take 1 tablet (54 mg total) by mouth daily. 90 tablet 1  . fluticasone (FLONASE) 50 MCG/ACT nasal spray Place 1 spray into both nostrils daily as needed for allergies or rhinitis.     . furosemide (LASIX) 40 MG tablet Take 1.5 tablets (60 mg total) by mouth 2 (two) times daily. 270 tablet 3  . Insulin Glargine (TOUJEO SOLOSTAR) 300 UNIT/ML SOPN Inject 80 Units into the skin at bedtime. 18 pen 0  . insulin lispro (HUMALOG KWIKPEN) 100 UNIT/ML KiwkPen INJECT 20 UNITS INTO TWICE DAILY AND 28 UNITS IN THE EVENING. 20 pen 0  . Insulin Pen Needle (BD PEN NEEDLE NANO U/F) 32G X 4 MM MISC USE TO INJECT INSULIN 4 TIMES DAILY. 380 each 1  . Insulin Pen Needle 32G X 4 MM MISC Use to inject insulin 4 times daily. 400 each 1  . metolazone (ZAROXOLYN) 2.5 MG tablet Take 1 tablet (2.5 mg total) by mouth daily. 90 tablet 3  . mirtazapine (REMERON) 15 MG tablet Take 15 mg by mouth at bedtime.    . modafinil (PROVIGIL) 200 MG tablet Take 1 tablet (200 mg total) by mouth daily. 90 tablet 0  . montelukast (SINGULAIR) 10 MG tablet Take 1 tablet (10 mg total) by mouth at bedtime. 90 tablet 0  . nitroGLYCERIN (NITROSTAT) 0.4 MG SL tablet Place 1 tablet (0.4 mg total) under the tongue every 5 (five) minutes as needed for chest pain. (Patient not taking: Reported on 06/08/2015) 25 tablet 3  . nystatin (MYCOSTATIN/NYSTOP) 100000 UNIT/GM POWD Apply topically 2 (two) times daily. USES UNDERNEATH BREAST    . ondansetron (ZOFRAN-ODT) 4 MG disintegrating tablet Take 4 mg by mouth every 8 (eight) hours as needed for nausea or vomiting.    Marland Kitchen oxyCODONE (OXY IR/ROXICODONE) 5 MG immediate release tablet Take 5 mg by mouth 2 (two) times daily.    . OXYGEN Inhale 3 L into the lungs continuous. 3 liters 24/7    . pantoprazole (PROTONIX) 40 MG tablet Take 1 tablet (40 mg total) by mouth daily. 30 tablet 0  . potassium chloride SA (K-DUR,KLOR-CON) 20 MEQ  tablet Take 1 tablet (20 mEq total) by mouth 3 (three) times daily. 270 tablet 3  . primidone (MYSOLINE) 50 MG tablet Take 1 tablet (50 mg total) by mouth at bedtime. 90 tablet 1  . rivaroxaban (XARELTO) 20 MG TABS tablet Take 1 tablet (20 mg total) by mouth daily with supper. 90 tablet  3  . sulfamethoxazole-trimethoprim (BACTRIM DS,SEPTRA DS) 800-160 MG tablet Take 1 tablet by mouth 2 (two) times daily. 20 tablet 0    No results found for this or any previous visit (from the past 48 hour(s)).  No results found.   Cardiovascular: negative, Afib  There were no vitals taken for this visit.  General appearance: alert, cooperative and appears stated age Head: Normocephalic, without obvious abnormality Neck: no JVD Resp: clear to auscultation bilaterally Cardio: regular rate and rhythm, S1, S2 normal, no murmur, click, rub or gallop GI: soft, non-tender; bowel sounds normal; no masses,  no organomegaly Extremities: paronychia rigfht thumb Pulses: 2+ and symmetric Skin: Skin color, texture, turgor normal. No rashes or lesions Neurologic: Grossly normal Incision/Wound: na  Assessment/Plan Dx: paronychia right thumb Plan: I&D right thumb  Crispin Vogel R 06/13/2015, 11:59 AM

## 2015-06-14 ENCOUNTER — Telehealth: Payer: Self-pay | Admitting: Internal Medicine

## 2015-06-14 ENCOUNTER — Encounter (HOSPITAL_BASED_OUTPATIENT_CLINIC_OR_DEPARTMENT_OTHER): Payer: Self-pay | Admitting: Orthopedic Surgery

## 2015-06-14 NOTE — Telephone Encounter (Signed)
Pt calling again.says she is in a lot of pain. She wants you to call her asap.

## 2015-06-14 NOTE — Telephone Encounter (Signed)
Please call,wants to know if you Ibuprofen 200 mg? She is concerned about taking this with her pain medicine and her other medicine.

## 2015-06-14 NOTE — Telephone Encounter (Signed)
Returned call to patient.She stated she had right thumb nail removed yesterday due to infection.Stated she is in a lot of pain and wanted to know if ok to take Ibuprofen 400 mg in between other pain medications.Stated current pain medications don't last.Advised ok to take and when pain is better stop taking Ibuprofen.

## 2015-06-18 LAB — ANAEROBIC CULTURE

## 2015-06-18 LAB — CULTURE, ROUTINE-ABSCESS

## 2015-06-20 DIAGNOSIS — I517 Cardiomegaly: Secondary | ICD-10-CM | POA: Diagnosis not present

## 2015-06-20 DIAGNOSIS — J209 Acute bronchitis, unspecified: Secondary | ICD-10-CM | POA: Diagnosis not present

## 2015-06-20 DIAGNOSIS — J189 Pneumonia, unspecified organism: Secondary | ICD-10-CM | POA: Diagnosis not present

## 2015-06-24 ENCOUNTER — Telehealth: Payer: Self-pay | Admitting: Physician Assistant

## 2015-06-24 ENCOUNTER — Inpatient Hospital Stay (HOSPITAL_COMMUNITY)
Admission: EM | Admit: 2015-06-24 | Discharge: 2015-06-26 | DRG: 378 | Discharge status: Against Medical Advice | Payer: Medicare Other | Attending: Internal Medicine | Admitting: Internal Medicine

## 2015-06-24 ENCOUNTER — Encounter (HOSPITAL_COMMUNITY): Payer: Self-pay | Admitting: Emergency Medicine

## 2015-06-24 ENCOUNTER — Emergency Department (HOSPITAL_COMMUNITY): Payer: Medicare Other

## 2015-06-24 DIAGNOSIS — Z885 Allergy status to narcotic agent status: Secondary | ICD-10-CM

## 2015-06-24 DIAGNOSIS — K922 Gastrointestinal hemorrhage, unspecified: Secondary | ICD-10-CM | POA: Diagnosis not present

## 2015-06-24 DIAGNOSIS — Z79891 Long term (current) use of opiate analgesic: Secondary | ICD-10-CM

## 2015-06-24 DIAGNOSIS — Z7951 Long term (current) use of inhaled steroids: Secondary | ICD-10-CM

## 2015-06-24 DIAGNOSIS — M1990 Unspecified osteoarthritis, unspecified site: Secondary | ICD-10-CM | POA: Diagnosis present

## 2015-06-24 DIAGNOSIS — E1365 Other specified diabetes mellitus with hyperglycemia: Secondary | ICD-10-CM

## 2015-06-24 DIAGNOSIS — H9190 Unspecified hearing loss, unspecified ear: Secondary | ICD-10-CM | POA: Diagnosis present

## 2015-06-24 DIAGNOSIS — Z881 Allergy status to other antibiotic agents status: Secondary | ICD-10-CM | POA: Diagnosis not present

## 2015-06-24 DIAGNOSIS — Z7901 Long term (current) use of anticoagulants: Secondary | ICD-10-CM

## 2015-06-24 DIAGNOSIS — J438 Other emphysema: Secondary | ICD-10-CM | POA: Diagnosis present

## 2015-06-24 DIAGNOSIS — I252 Old myocardial infarction: Secondary | ICD-10-CM | POA: Diagnosis not present

## 2015-06-24 DIAGNOSIS — E785 Hyperlipidemia, unspecified: Secondary | ICD-10-CM | POA: Diagnosis present

## 2015-06-24 DIAGNOSIS — N179 Acute kidney failure, unspecified: Secondary | ICD-10-CM | POA: Diagnosis present

## 2015-06-24 DIAGNOSIS — D72829 Elevated white blood cell count, unspecified: Secondary | ICD-10-CM | POA: Diagnosis present

## 2015-06-24 DIAGNOSIS — N189 Chronic kidney disease, unspecified: Secondary | ICD-10-CM | POA: Diagnosis not present

## 2015-06-24 DIAGNOSIS — D62 Acute posthemorrhagic anemia: Secondary | ICD-10-CM | POA: Diagnosis not present

## 2015-06-24 DIAGNOSIS — I4891 Unspecified atrial fibrillation: Secondary | ICD-10-CM | POA: Diagnosis present

## 2015-06-24 DIAGNOSIS — Z79899 Other long term (current) drug therapy: Secondary | ICD-10-CM

## 2015-06-24 DIAGNOSIS — F319 Bipolar disorder, unspecified: Secondary | ICD-10-CM | POA: Diagnosis present

## 2015-06-24 DIAGNOSIS — R0902 Hypoxemia: Secondary | ICD-10-CM | POA: Diagnosis present

## 2015-06-24 DIAGNOSIS — I509 Heart failure, unspecified: Secondary | ICD-10-CM | POA: Diagnosis not present

## 2015-06-24 DIAGNOSIS — K921 Melena: Principal | ICD-10-CM | POA: Diagnosis present

## 2015-06-24 DIAGNOSIS — Z87891 Personal history of nicotine dependence: Secondary | ICD-10-CM | POA: Diagnosis not present

## 2015-06-24 DIAGNOSIS — E1122 Type 2 diabetes mellitus with diabetic chronic kidney disease: Secondary | ICD-10-CM | POA: Diagnosis present

## 2015-06-24 DIAGNOSIS — I5022 Chronic systolic (congestive) heart failure: Secondary | ICD-10-CM | POA: Diagnosis present

## 2015-06-24 DIAGNOSIS — E860 Dehydration: Secondary | ICD-10-CM | POA: Diagnosis present

## 2015-06-24 DIAGNOSIS — IMO0002 Reserved for concepts with insufficient information to code with codable children: Secondary | ICD-10-CM | POA: Diagnosis present

## 2015-06-24 DIAGNOSIS — E1165 Type 2 diabetes mellitus with hyperglycemia: Secondary | ICD-10-CM | POA: Diagnosis present

## 2015-06-24 DIAGNOSIS — J449 Chronic obstructive pulmonary disease, unspecified: Secondary | ICD-10-CM | POA: Diagnosis present

## 2015-06-24 DIAGNOSIS — N182 Chronic kidney disease, stage 2 (mild): Secondary | ICD-10-CM | POA: Diagnosis present

## 2015-06-24 DIAGNOSIS — J45909 Unspecified asthma, uncomplicated: Secondary | ICD-10-CM | POA: Diagnosis present

## 2015-06-24 DIAGNOSIS — F419 Anxiety disorder, unspecified: Secondary | ICD-10-CM | POA: Diagnosis present

## 2015-06-24 DIAGNOSIS — E1322 Other specified diabetes mellitus with diabetic chronic kidney disease: Secondary | ICD-10-CM | POA: Diagnosis present

## 2015-06-24 DIAGNOSIS — R0602 Shortness of breath: Secondary | ICD-10-CM | POA: Diagnosis not present

## 2015-06-24 DIAGNOSIS — D500 Iron deficiency anemia secondary to blood loss (chronic): Secondary | ICD-10-CM | POA: Diagnosis present

## 2015-06-24 DIAGNOSIS — G4733 Obstructive sleep apnea (adult) (pediatric): Secondary | ICD-10-CM | POA: Diagnosis present

## 2015-06-24 DIAGNOSIS — I2510 Atherosclerotic heart disease of native coronary artery without angina pectoris: Secondary | ICD-10-CM | POA: Diagnosis present

## 2015-06-24 DIAGNOSIS — Z888 Allergy status to other drugs, medicaments and biological substances status: Secondary | ICD-10-CM

## 2015-06-24 DIAGNOSIS — Z794 Long term (current) use of insulin: Secondary | ICD-10-CM

## 2015-06-24 DIAGNOSIS — N183 Chronic kidney disease, stage 3 unspecified: Secondary | ICD-10-CM | POA: Diagnosis present

## 2015-06-24 DIAGNOSIS — M797 Fibromyalgia: Secondary | ICD-10-CM | POA: Diagnosis present

## 2015-06-24 DIAGNOSIS — K219 Gastro-esophageal reflux disease without esophagitis: Secondary | ICD-10-CM | POA: Diagnosis present

## 2015-06-24 DIAGNOSIS — G629 Polyneuropathy, unspecified: Secondary | ICD-10-CM | POA: Diagnosis present

## 2015-06-24 DIAGNOSIS — I482 Chronic atrial fibrillation: Secondary | ICD-10-CM | POA: Diagnosis present

## 2015-06-24 DIAGNOSIS — Z9981 Dependence on supplemental oxygen: Secondary | ICD-10-CM | POA: Diagnosis not present

## 2015-06-24 DIAGNOSIS — I480 Paroxysmal atrial fibrillation: Secondary | ICD-10-CM | POA: Diagnosis not present

## 2015-06-24 LAB — PROTIME-INR
INR: 1.61 — ABNORMAL HIGH (ref 0.00–1.49)
Prothrombin Time: 19.2 seconds — ABNORMAL HIGH (ref 11.6–15.2)

## 2015-06-24 LAB — CBC
HEMATOCRIT: 17.5 % — AB (ref 36.0–46.0)
Hemoglobin: 5.6 g/dL — CL (ref 12.0–15.0)
MCH: 27.7 pg (ref 26.0–34.0)
MCHC: 32 g/dL (ref 30.0–36.0)
MCV: 86.6 fL (ref 78.0–100.0)
Platelets: 347 10*3/uL (ref 150–400)
RBC: 2.02 MIL/uL — ABNORMAL LOW (ref 3.87–5.11)
RDW: 14.2 % (ref 11.5–15.5)
WBC: 17.4 10*3/uL — ABNORMAL HIGH (ref 4.0–10.5)

## 2015-06-24 LAB — URINALYSIS, ROUTINE W REFLEX MICROSCOPIC
Bilirubin Urine: NEGATIVE
Glucose, UA: NEGATIVE mg/dL
Hgb urine dipstick: NEGATIVE
KETONES UR: NEGATIVE mg/dL
LEUKOCYTES UA: NEGATIVE
NITRITE: NEGATIVE
PH: 7 (ref 5.0–8.0)
Protein, ur: NEGATIVE mg/dL
SPECIFIC GRAVITY, URINE: 1.008 (ref 1.005–1.030)

## 2015-06-24 LAB — PREPARE RBC (CROSSMATCH)

## 2015-06-24 LAB — BRAIN NATRIURETIC PEPTIDE: B Natriuretic Peptide: 216.5 pg/mL — ABNORMAL HIGH (ref 0.0–100.0)

## 2015-06-24 LAB — BASIC METABOLIC PANEL
Anion gap: 14 (ref 5–15)
BUN: 58 mg/dL — AB (ref 6–20)
CALCIUM: 8.7 mg/dL — AB (ref 8.9–10.3)
CO2: 34 mmol/L — ABNORMAL HIGH (ref 22–32)
Chloride: 88 mmol/L — ABNORMAL LOW (ref 101–111)
Creatinine, Ser: 1.78 mg/dL — ABNORMAL HIGH (ref 0.44–1.00)
GFR calc Af Amer: 34 mL/min — ABNORMAL LOW (ref >60)
GFR, EST NON AFRICAN AMERICAN: 29 mL/min — AB (ref >60)
GLUCOSE: 164 mg/dL — AB (ref 65–99)
POTASSIUM: 3.6 mmol/L (ref 3.5–5.1)
Sodium: 136 mmol/L (ref 135–145)

## 2015-06-24 LAB — APTT: aPTT: 34 seconds (ref 24–37)

## 2015-06-24 LAB — I-STAT TROPONIN, ED: Troponin i, poc: 0 ng/mL (ref 0.00–0.08)

## 2015-06-24 LAB — ABO/RH: ABO/RH(D): A POS

## 2015-06-24 LAB — POC OCCULT BLOOD, ED: FECAL OCCULT BLD: POSITIVE — AB

## 2015-06-24 MED ORDER — NITROGLYCERIN 0.4 MG SL SUBL: 0.4000 mg | SUBLINGUAL_TABLET | SUBLINGUAL | Status: DC | PRN

## 2015-06-24 MED ORDER — FUROSEMIDE 10 MG/ML IJ SOLN
10.0000 mg | Freq: Four times a day (QID) | INTRAMUSCULAR | Status: AC
  Administered 2015-06-25 (×2): 10 mg via INTRAVENOUS
  Filled 2015-06-24 (×2): qty 2

## 2015-06-24 MED ORDER — METOLAZONE 2.5 MG PO TABS
2.5000 mg | ORAL_TABLET | Freq: Every day | ORAL | Status: DC
  Filled 2015-06-24: qty 1

## 2015-06-24 MED ORDER — LORATADINE 10 MG PO TABS: 10.0000 mg | ORAL_TABLET | Freq: Every day | ORAL | Status: DC

## 2015-06-24 MED ORDER — SACCHAROMYCES BOULARDII 250 MG PO CAPS
250.0000 mg | ORAL_CAPSULE | Freq: Every day | ORAL | Status: DC
  Administered 2015-06-25: 250 mg via ORAL
  Filled 2015-06-24: qty 1

## 2015-06-24 MED ORDER — FLUTICASONE PROPIONATE 50 MCG/ACT NA SUSP
1.0000 | Freq: Every day | NASAL | Status: DC | PRN
  Filled 2015-06-24: qty 16

## 2015-06-24 MED ORDER — OXYCODONE HCL 5 MG PO TABS
5.0000 mg | ORAL_TABLET | Freq: Two times a day (BID) | ORAL | Status: DC | PRN
  Administered 2015-06-25 – 2015-06-26 (×3): 5 mg via ORAL
  Filled 2015-06-24 (×3): qty 1

## 2015-06-24 MED ORDER — FUROSEMIDE 80 MG PO TABS
80.0000 mg | ORAL_TABLET | Freq: Two times a day (BID) | ORAL | Status: DC
  Administered 2015-06-25: 80 mg via ORAL
  Filled 2015-06-24: qty 1

## 2015-06-24 MED ORDER — ESCITALOPRAM OXALATE 10 MG PO TABS
10.0000 mg | ORAL_TABLET | Freq: Every day | ORAL | Status: DC
  Administered 2015-06-25 (×2): 10 mg via ORAL
  Filled 2015-06-24 (×2): qty 1

## 2015-06-24 MED ORDER — MODAFINIL 100 MG PO TABS
200.0000 mg | ORAL_TABLET | Freq: Every day | ORAL | Status: DC
  Administered 2015-06-25 – 2015-06-26 (×2): 200 mg via ORAL
  Filled 2015-06-24 (×2): qty 2

## 2015-06-24 MED ORDER — MIRTAZAPINE 15 MG PO TABS
15.0000 mg | ORAL_TABLET | Freq: Every day | ORAL | Status: DC
  Administered 2015-06-25 (×2): 15 mg via ORAL
  Filled 2015-06-24 (×2): qty 1

## 2015-06-24 MED ORDER — PRIMIDONE 50 MG PO TABS
50.0000 mg | ORAL_TABLET | Freq: Every day | ORAL | Status: DC
  Administered 2015-06-25: 50 mg via ORAL
  Filled 2015-06-24 (×3): qty 1

## 2015-06-24 MED ORDER — ARFORMOTEROL TARTRATE 15 MCG/2ML IN NEBU
15.0000 ug | INHALATION_SOLUTION | Freq: Two times a day (BID) | RESPIRATORY_TRACT | Status: DC
  Administered 2015-06-25 – 2015-06-26 (×2): 15 ug via RESPIRATORY_TRACT
  Filled 2015-06-24 (×2): qty 2

## 2015-06-24 MED ORDER — DILTIAZEM HCL 30 MG PO TABS
30.0000 mg | ORAL_TABLET | Freq: Two times a day (BID) | ORAL | Status: DC
  Administered 2015-06-25 – 2015-06-26 (×4): 30 mg via ORAL
  Filled 2015-06-24 (×4): qty 1

## 2015-06-24 MED ORDER — BUDESONIDE 0.5 MG/2ML IN SUSP
0.5000 mg | Freq: Two times a day (BID) | RESPIRATORY_TRACT | Status: DC
  Administered 2015-06-25 – 2015-06-26 (×3): 0.5 mg via RESPIRATORY_TRACT
  Filled 2015-06-24 (×3): qty 2

## 2015-06-24 MED ORDER — MONTELUKAST SODIUM 10 MG PO TABS
10.0000 mg | ORAL_TABLET | Freq: Every day | ORAL | Status: DC
  Administered 2015-06-25: 10 mg via ORAL
  Filled 2015-06-24: qty 1

## 2015-06-24 MED ORDER — SODIUM CHLORIDE 0.9 % IV BOLUS (SEPSIS)
1000.0000 mL | Freq: Once | INTRAVENOUS | Status: AC
  Administered 2015-06-24: 1000 mL via INTRAVENOUS

## 2015-06-24 MED ORDER — SULFAMETHOXAZOLE-TRIMETHOPRIM 400-80 MG PO TABS: 1.0000 | ORAL_TABLET | Freq: Two times a day (BID) | ORAL | Status: DC

## 2015-06-24 MED ORDER — ATORVASTATIN CALCIUM 20 MG PO TABS
20.0000 mg | ORAL_TABLET | Freq: Every day | ORAL | Status: DC
  Administered 2015-06-25 – 2015-06-26 (×2): 20 mg via ORAL
  Filled 2015-06-24 (×2): qty 1

## 2015-06-24 MED ORDER — POTASSIUM CHLORIDE CRYS ER 20 MEQ PO TBCR
20.0000 meq | EXTENDED_RELEASE_TABLET | Freq: Three times a day (TID) | ORAL | Status: DC
  Administered 2015-06-25 – 2015-06-26 (×5): 20 meq via ORAL
  Filled 2015-06-24 (×5): qty 1

## 2015-06-24 MED ORDER — DIPHENHYDRAMINE HCL 25 MG PO CAPS
25.0000 mg | ORAL_CAPSULE | Freq: Four times a day (QID) | ORAL | Status: DC | PRN
  Administered 2015-06-25 – 2015-06-26 (×4): 25 mg via ORAL
  Filled 2015-06-24 (×5): qty 1

## 2015-06-24 MED ORDER — ALBUTEROL SULFATE (2.5 MG/3ML) 0.083% IN NEBU
2.5000 mg | INHALATION_SOLUTION | Freq: Four times a day (QID) | RESPIRATORY_TRACT | Status: DC | PRN
  Administered 2015-06-25: 2.5 mg via RESPIRATORY_TRACT
  Filled 2015-06-24: qty 3

## 2015-06-24 MED ORDER — SODIUM CHLORIDE 0.9 % IV SOLN
10.0000 mL/h | Freq: Once | INTRAVENOUS | Status: AC
  Administered 2015-06-24: 10 mL/h via INTRAVENOUS

## 2015-06-24 MED ORDER — PANTOPRAZOLE SODIUM 40 MG IV SOLR
40.0000 mg | Freq: Once | INTRAVENOUS | Status: AC
  Administered 2015-06-24: 40 mg via INTRAVENOUS
  Filled 2015-06-24: qty 40

## 2015-06-24 MED ORDER — FENOFIBRATE 54 MG PO TABS
54.0000 mg | ORAL_TABLET | Freq: Every day | ORAL | Status: DC
  Administered 2015-06-25 – 2015-06-26 (×2): 54 mg via ORAL
  Filled 2015-06-24 (×2): qty 1

## 2015-06-24 MED ORDER — NYSTATIN 100000 UNIT/GM EX POWD
Freq: Two times a day (BID) | CUTANEOUS | Status: DC
  Administered 2015-06-25 – 2015-06-26 (×3): via TOPICAL
  Filled 2015-06-24: qty 15

## 2015-06-24 NOTE — ED Notes (Signed)
Patient returned to room.  Monitor reapplied.

## 2015-06-24 NOTE — Telephone Encounter (Signed)
Patient called answering service asking for advice. She reports she is so SOB and swollen she can barely transfer from potty chair to other room. She has been taking extra Lasix - now 80mg  BID without significant relief. She is also on metolazone. Last BMET in 03/2015 showed significant hypokalemia. She is also concerned about running out of recent antibiotic and pain from recent I/D of finger - continued pain at this site.  In light of significant comorbidities, complex hx, and significant hypokalemia in 03/2015 with ongoing metolazone use I do not feel comfortable asking her to further titrate her Lasix without having her evaluated in person to repeat BMET. I have advised she proceed to ED for eval. She verbalized understanding and gratitude.  Giavonna Pflum PA-C

## 2015-06-24 NOTE — ED Notes (Signed)
Pt sts SOB and fluid retention; pt sts weight loss; pt on home O2 at 3L

## 2015-06-24 NOTE — H&P (Addendum)
[facility] History and Physical  Denise Freeman [id number] DOB: 1950/11/26 DOA: 06/24/2015  Referring physician: ED PCP: Denise Pepper, MD   Chief Complaint: shortness of breath  HPI: Denise Freeman is a 65 year old female with a past medical history significant for insulin-dependent diabetes, COPD, oxygen dependent on 3 L nasal cannula oxygen, asthma, CHF, A. fib on Xarelto, OSA on BiPAP; who presents with complaints of shortness of breath for the last 3-4 days. Patient noted that there was some mixup with her Lasix and she is supposed to be on 80 mg twice a day, but was only on 40 mg twice a day until her recent prescription for 80 mg came in yesterday. She noted that her legs have been swelling. She states that she feels more short of breath had been normal and has felt dizzy and faint. Yesterday the entire day she had black tarry stools. She cannot quantify but states that her husband got tired of having to wipe her. She notes that she couldn't sleep all night due to the shortness of breath symptoms and nothing seemed to make symptoms better. Activity made symptoms worse. Patient denies chest pain symptoms. She also notes that she has not had anything to eat all day and that she's taken all 3 doses of her short acting Humalog insulin today. She reports any time as she lays down to rest that she uses her BiPAP machine which is set at 10.  Upon admission into the emergency department patient was evaluated with chest x-ray which showed vascular congestion with cardiac enlargement but no pulmonary edema. Her initial lab work showed hemoglobin was 5.6, WBC 17.4, BUN 58, creatinine 1.78, glucose 164, and BNP was 216 . Patient was found to be guaiac positive stools.   Review of Systems  Constitutional: Positive for malaise/fatigue. Negative for fever, chills and weight loss.  HENT: Positive for hearing loss. Negative for ear pain.   Eyes: Negative for photophobia, pain and redness.  Respiratory:  Positive for shortness of breath and wheezing. Negative for hemoptysis.   Cardiovascular: Positive for orthopnea and leg swelling. Negative for chest pain.  Gastrointestinal: Positive for diarrhea and blood in stool. Negative for nausea, vomiting and constipation.  Genitourinary: Positive for frequency. Negative for dysuria and hematuria.  Musculoskeletal: Positive for joint pain. Negative for falls.  Skin: Negative for itching and rash.  Neurological: Positive for weakness. Negative for tremors and focal weakness.  Psychiatric/Behavioral: Negative for hallucinations. The patient has insomnia.        Past Medical History  Diagnosis Date  . Diabetes mellitus without complication ([place])   . Bipolar 1 disorder ([place])   . Fibromyalgia   . COPD (chronic obstructive pulmonary disease) ([place])   . CHF (congestive heart failure) ([place])   . Asthma   . Atrial fibrillation ([place])   . Neuropathy ([facility])     Disc Back   . Fibromyalgia   . CHF (congestive heart failure) ([place])   . Coronary artery disease   . Anemia   . History of hiatal hernia   . GERD (gastroesophageal reflux disease)   . Shortness of breath dyspnea   . Family history of adverse reaction to anesthesia     Uncle was positive for malignant hyerthermia; patient had testing done and was negative.  . Myocardial infarction ([place]) 1990  . Sleep apnea     uses biPAP, 10  . Arthritis   . HOH (hard of hearing)      Past Surgical History  Procedure Laterality  Date  . Heel spur surgery Bilateral   . Appendectomy    . Bladder suspension      2003, 2006 and 2010  . Neck surgery N/A 2009    4, 6, and 7 cervical disc replaced  . Bladder stimulator      pt states, "it cannot be turned off".  . Hernia repair      umbilical  . Cataract extraction w/phaco Right 11/29/2014    Procedure: CATARACT EXTRACTION PHACO AND INTRAOCULAR LENS PLACEMENT (IOC);  Surgeon: Rutherford Guys, MD;  Location: [facility];  Service: Ophthalmology;  Laterality: Right;   CDE:3.81  . Cataract extraction w/phaco Left 12/13/2014    Procedure: CATARACT EXTRACTION PHACO AND INTRAOCULAR LENS PLACEMENT (IOC);  Surgeon: Rutherford Guys, MD;  Location: [facility];  Service: Ophthalmology;  Laterality: Left;  CDE:6.59  . I&d extremity Right 06/13/2015    Procedure: MINOR IRRIGATION AND DEBRIDEMENT EXTREMITY REMOVAL OF NAIL;  Surgeon: Daryll Brod, MD;  Location: [facility];  Service: Orthopedics;  Laterality: Right;      Social History:  reports that she quit smoking about 26 years ago. Her smoking use included Cigarettes. She has a 28 pack-year smoking history. She does not have any smokeless tobacco history on file. She reports that she does not drink alcohol or use illicit drugs. Where does patient live--home  and with whom if at home? Husband Can patient participate in ADLs? With some assistance  Allergies  Allergen Reactions  . Ancef [Cefazolin] Nausea And Vomiting  . Augmentin [Amoxicillin-Pot Clavulanate] Itching  . Ciprofloxacin Itching  . Haldol [Haloperidol] Other (See Comments)    Restless leg  . Levaquin [Levofloxacin In D5w] Other (See Comments)    "afib"  . Lyrica [Pregabalin] Hives  . Nsaids Diarrhea  . Tamiflu [Oseltamivir Phosphate] Other (See Comments)    "water blisters"  . Topiramate Nausea Only  . Zoloft [Sertraline Hcl] Other (See Comments)    Jaw problems, jittery  . Penicillins Itching, Nausea And Vomiting and Rash    Has patient had a PCN reaction causing immediate rash, facial/tongue/throat swelling, SOB or lightheadedness with hypotension: Yes Has patient had a PCN reaction causing severe rash involving mucus membranes or skin necrosis: No Has patient had a PCN reaction that required hospitalization No Has patient had a PCN reaction occurring within the last 10 years: Yes If all of the above answers are "NO", then may proceed with Cephalosporin use.    Family History  Problem Relation Age of Onset  . Heart disease Mother    . COPD Mother   . Cancer Mother     Breast cancer  . Diabetes Mother   . Heart disease Father   . COPD Sister   . Heart disease Sister   . Diabetes Sister   . Heart disease Maternal Grandmother         Prior to Admission medications   Medication Sig Start Date End Date Taking? Authorizing Provider  albuterol (PROVENTIL) (2.5 MG/3ML) 0.083% nebulizer solution Take 3 mLs (2.5 mg total) by nebulization every 6 (six) hours as needed for wheezing or shortness of breath. 05/01/15   Chesley Mires, MD  atorvastatin (LIPITOR) 20 MG tablet Take 1 tablet (20 mg total) by mouth daily. 06/02/14   Thurnell Lose, MD  budesonide (PULMICORT) 0.5 MG/2ML nebulizer solution Take 2 mLs (0.5 mg total) by nebulization 2 (two) times daily. DX J43.8 05/01/15   Chesley Mires, MD  cetirizine (ZYRTEC) 10 MG tablet Take 10 mg by mouth  daily.    Historical Provider, MD  diltiazem (CARDIZEM) 30 MG tablet Take 1 tablet (30 mg total) by mouth 2 (two) times daily. 05/08/15   Pixie Casino, MD  diphenhydrAMINE (BENADRYL) 25 mg capsule Take 25 mg by mouth every 6 (six) hours as needed for itching or allergies.     Historical Provider, MD  docusate sodium (COLACE) 100 MG capsule Take 100 mg by mouth daily.    Historical Provider, MD  escitalopram (LEXAPRO) 10 MG tablet Take 10 mg by mouth at bedtime.     Historical Provider, MD  famotidine (PEPCID) 40 MG tablet Take 20 mg by mouth 2 (two) times daily.     Historical Provider, MD  fenofibrate 54 MG tablet Take 1 tablet (54 mg total) by mouth daily. 05/15/15   Philemon Kingdom, MD  fluticasone (FLONASE) 50 MCG/ACT nasal spray Place 1 spray into both nostrils daily as needed for allergies or rhinitis.     Historical Provider, MD  furosemide (LASIX) 40 MG tablet Take 1.5 tablets (60 mg total) by mouth 2 (two) times daily. 06/01/15   Pixie Casino, MD  HYDROcodone-acetaminophen (NORCO) 5-325 MG tablet Take 1 tablet by mouth every 6 (six) hours as needed for moderate pain.  06/13/15   Daryll Brod, MD  Insulin Glargine (TOUJEO SOLOSTAR) 300 UNIT/ML SOPN Inject 80 Units into the skin at bedtime. 05/15/15   Philemon Kingdom, MD  insulin lispro (HUMALOG KWIKPEN) 100 UNIT/ML KiwkPen INJECT 20 UNITS INTO TWICE DAILY AND 28 UNITS IN THE EVENING. 05/15/15   Philemon Kingdom, MD  Insulin Pen Needle (BD PEN NEEDLE NANO U/F) 32G X 4 MM MISC USE TO INJECT INSULIN 4 TIMES DAILY. 05/15/15   Philemon Kingdom, MD  Insulin Pen Needle 32G X 4 MM MISC Use to inject insulin 4 times daily. 05/02/15   Philemon Kingdom, MD  metolazone (ZAROXOLYN) 2.5 MG tablet Take 1 tablet (2.5 mg total) by mouth daily. 05/08/15   Pixie Casino, MD  mirtazapine (REMERON) 15 MG tablet Take 15 mg by mouth at bedtime.    Historical Provider, MD  modafinil (PROVIGIL) 200 MG tablet Take 1 tablet (200 mg total) by mouth daily. 05/19/15   Chesley Mires, MD  montelukast (SINGULAIR) 10 MG tablet Take 1 tablet (10 mg total) by mouth at bedtime. 05/01/15   Chesley Mires, MD  nitroGLYCERIN (NITROSTAT) 0.4 MG SL tablet Place 1 tablet (0.4 mg total) under the tongue every 5 (five) minutes as needed for chest pain. 04/21/15   Pixie Casino, MD  nystatin (MYCOSTATIN/NYSTOP) 100000 UNIT/GM POWD Apply topically 2 (two) times daily. USES UNDERNEATH BREAST    Historical Provider, MD  ondansetron (ZOFRAN-ODT) 4 MG disintegrating tablet Take 4 mg by mouth every 8 (eight) hours as needed for nausea or vomiting.    Historical Provider, MD  oxyCODONE (OXY IR/ROXICODONE) 5 MG immediate release tablet Take 5 mg by mouth 2 (two) times daily.    Historical Provider, MD  OXYGEN Inhale 3 L into the lungs continuous. 3 liters 24/7    Historical Provider, MD  pantoprazole (PROTONIX) 40 MG tablet Take 1 tablet (40 mg total) by mouth daily. 06/02/14   Thurnell Lose, MD  potassium chloride SA (K-DUR,KLOR-CON) 20 MEQ tablet Take 1 tablet (20 mEq total) by mouth 3 (three) times daily. 05/08/15   Pixie Casino, MD  primidone (MYSOLINE) 50 MG  tablet Take 1 tablet (50 mg total) by mouth at bedtime. 06/08/15   Eustace Quail Tat, DO  rivaroxaban Alveda Reasons)  20 MG TABS tablet Take 1 tablet (20 mg total) by mouth daily with supper. 05/08/15   Pixie Casino, MD  sulfamethoxazole-trimethoprim (BACTRIM) 400-80 MG tablet Take 1 tablet by mouth 2 (two) times daily. 06/13/15   Daryll Brod, MD     Physical Exam: Filed Vitals:   06/24/15 1900 06/24/15 1915 06/24/15 1954 06/24/15 1955  BP:   121/70 121/70  Pulse: 106 103 105 105  Temp:   97.8 F (36.6 C) 97.8 F (36.6 C)  TempSrc:   Oral Axillary  Resp: 19 24 18 18   SpO2: 100% 100% 100% 100%    Constitutional: Vital signs reviewed. Patient is a elderly obese female who appears restless. Head: Normocephalic and atraumatic  Ear: TM normal bilaterally  Mouth: no erythema or exudates, dry mucous membranes Eyes: PERRL, EOMI, conjunctivae normal, No scleral icterus.  Neck: Supple, Trachea midline normal ROM, No JVD, mass, thyromegaly, or carotid bruit present.  Cardiovascular: Irregular irregular rhythm with tachycardia Pulmonary/Chest: on 4 L nasal cannula oxygen with decreased breath sounds no wheezes appreciated. Abdominal: Soft. Non-tender, non-distended, bowel sounds are normal, no masses, organomegaly, or guarding present.  GU: no CVA tenderness Musculoskeletal: No joint deformities, erythema, or stiffness, ROM full and no nontender Ext: Trace lower extremity edema and no cyanosis, pulses palpable bilaterally (DP and PT)  Hematology: no cervical, inginal, or axillary adenopathy.  Neurological: A&O x3, Strenght is normal and symmetric bilaterally, cranial nerve II-XII are grossly intact, no focal motor deficit, sensory intact to light touch bilaterally.  Skin: Warm, dry and intact. Skin is very pale.  Psychiatric: Anxious. speech and behavior is normal. Judgment and thought content normal. Cognition and memory are normal.      Data Review   Micro Results No results found for this or any  previous visit (from the past 240 hour(s)).  Radiology Reports Dg Chest 2 View  06/24/2015  CLINICAL DATA:  Shortness of breath, bilateral lower extremity swelling for 3 days EXAM: CHEST  2 VIEW COMPARISON:  03/30/2015 FINDINGS: Moderately severe cardiac enlargement. Mild to moderate vascular congestion. These findings are similar to the prior study although vascular congestion is mildly more prominent. There is no evidence of pulmonary edema. No consolidation or effusion. IMPRESSION: Vascular congestion with cardiac enlargement but no evidence of pulmonary edema Electronically Signed   By: Skipper Cliche M.D.   On: 06/24/2015 17:48     CBC  Recent Labs Lab 06/24/15 1751  WBC 17.4*  HGB 5.6*  HCT 17.5*  PLT 347  MCV 86.6  MCH 27.7  MCHC 32.0  RDW 14.2    Chemistries   Recent Labs Lab 06/24/15 1751  NA 136  K 3.6  CL 88*  CO2 34*  GLUCOSE 164*  BUN 58*  CREATININE 1.78*  CALCIUM 8.7*   ------------------------------------------------------------------------------------------------------------------ CrCl cannot be calculated (Unknown ideal weight.). ------------------------------------------------------------------------------------------------------------------ No results for input(s): HGBA1C in the last 72 hours. ------------------------------------------------------------------------------------------------------------------ No results for input(s): CHOL, HDL, LDLCALC, TRIG, CHOLHDL, LDLDIRECT in the last 72 hours. ------------------------------------------------------------------------------------------------------------------ No results for input(s): TSH, T4TOTAL, T3FREE, THYROIDAB in the last 72 hours.  Invalid input(s): FREET3 ------------------------------------------------------------------------------------------------------------------ No results for input(s): VITAMINB12, FOLATE, FERRITIN, TIBC, IRON, RETICCTPCT in the last 72 hours.  Coagulation  profile  Recent Labs Lab 06/24/15 1751  INR 1.61*    No results for input(s): DDIMER in the last 72 hours.  Cardiac Enzymes No results for input(s): CKMB, TROPONINI, MYOGLOBIN in the last 168 hours.  Invalid input(s): CK ------------------------------------------------------------------------------------------------------------------ Invalid input(s): POCBNP   CBG:  No results for input(s): GLUCAP in the last 168 hours.     EKG: Independently reviewed. Atrial fibrillation   Assessment/Plan  GI bleed: Acute. Patient with complaints of black tarry stools 1 day with multiple bowel movements. Hemoglobin was previously 10.7 in 03/2015 and is acutely 5.6 on admission today. Stools guaiac positive. Suspect that this could be upper GI bleed as BUN is significantly elevated at 58.  Patient is on Xarelto for history of A. fib and last took it yesterday night - Admit to stepdown for close monitoring - Pt to be NPO except for meds - Protonix drip - T&S - Transfuse 3 units of red blood cells - Give additional furosemide 10 mg IV with each unit of PRBCs - Hold Xarelto - Consult GI in a.m.  Acute blood loss anemia: As seen above - continue to monitor H&H every 4 hours  - transfuse if hemoglobin less than 8 given cardiac history   Atrial fibrillation on Xarelto: Chronic. [name] heart rates have been in the 90 - 100s chadvas score 5.  - Continue Cardizem - Held Xarelto secondary to acute bleed  Acute kidney injury on chronic kidney disease: Creatinine elevated at 1.78 on admission with a BUN of 58, but baseline creatinine 1.07 back in 10/ 2016.  - Recheck a BMP in a.m. - Suspect improvement with administration of blood  Leukocytosis: white blood cell count elevated at 17.4 suspect that this is reactive in nature - Continue to monitor - checking a UA - Holding off on administration of antibiotics at this time  Congestive heart failure: last echo in 05/2014 EF was 45-50%.  Patient with a mildly elevated BNP of 216 on admission. Clinically does not look fluid overloaded. Chest x-ray shows some mild vascular congestion. Patient was given 1 L of fluids, started on Protonix drip along with blood products will need to monitor closely for signs of fluid overload. - Strict ins and outs and daily weights - Continue metolazone, furosemide  COPD - Continue albuterol and budesonide nebulizier treatments - Consult respiratory therapy for assistance  Diabetes mellitus type 2: Uncontrolled. last hemoglobin A1c was 11.7 in August 2016. Patient notes that she's taken her 3 doses of her short acting insulin throughout the day although reports not eating anything. - Check hemoglobin A1c in a.m.  - Held home insulin regimen - CBGs every 4 hours with sensitive sliding scale overnight   OSA on BiPAP : [name] husband to bring home BiPAP machine. 10 - Use prn sleep    Hyperlipidemia  - continue Atorvastatin     Anxiety/depression  - Continue Lexapro   Code Status:   full Family Communication: bedside Disposition Plan: admit   Total time spent 55 minutes.Greater than 50% of this time was spent in counseling, explanation of diagnosis, planning of further management, and coordination of care  [facility] Pager [phone]  If 7PM-7AM, please contact night-coverage [email or web address] Password [email or web address] 06/24/2015, 8:19 PM

## 2015-06-24 NOTE — ED Notes (Signed)
Patient transported to X-ray 

## 2015-06-24 NOTE — ED Provider Notes (Signed)
CSN: [id number]     Arrival date & time 06/24/15  1703 History   First MD Initiated Contact with Patient 06/24/15 1726     Chief Complaint  Patient presents with  . Shortness of Breath    HPI  [name] is an 65 y.o. female with history of CHF, COPD, CAD, GERD, afib on Xarelto, chronic hypoxia with 3L of O2 by [place] at home, who presents to the ED for evaluation of SOB and DOE. States her symptoms began ~4 days ago. She states she thinks it's because she is fluid overloaded. She states there was a mixup with her home lasix and for a while had been taking 40mg  Lasix BID but she is supposed to be on 80mg  BID. She states she just got the new prescription for 80mg  BID yesterday but she continues to feel SOB. She states that normally she gets around with a scooter but at baseline she can walk from one room to another in her house with no problem. States for the past few days she has been getting very short of breath walking from one room to another at home. Denies feeling dizzy,f aint, or light headed. Denies chest pain. In the room pt is very verbal and speaking in full sentences/paragraphs comfortably with no increased WOB. She is maintaining SpO2 98-100% on 3L of O2 which is her baseline. She has no pitting edema and no rhonchi or rales to suggest fluid overload on my exam.  Past Medical History  Diagnosis Date  . Diabetes mellitus without complication ([place])   . Bipolar 1 disorder ([place])   . Fibromyalgia   . COPD (chronic obstructive pulmonary disease) ([place])   . CHF (congestive heart failure) ([place])   . Asthma   . Atrial fibrillation ([place])   . Neuropathy ([facility])     Disc Back   . Fibromyalgia   . CHF (congestive heart failure) ([place])   . Coronary artery disease   . Anemia   . History of hiatal hernia   . GERD (gastroesophageal reflux disease)   . Shortness of breath dyspnea   . Family history of adverse reaction to anesthesia     Uncle was positive for malignant hyerthermia; patient had testing  done and was negative.  . Myocardial infarction ([place]) 1990  . Sleep apnea     uses biPAP, 10  . Arthritis   . HOH (hard of hearing)    Past Surgical History  Procedure Laterality Date  . Heel spur surgery Bilateral   . Appendectomy    . Bladder suspension      2003, 2006 and 2010  . Neck surgery N/A 2009    4, 6, and 7 cervical disc replaced  . Bladder stimulator      pt states, "it cannot be turned off".  . Hernia repair      umbilical  . Cataract extraction w/phaco Right 11/29/2014    Procedure: CATARACT EXTRACTION PHACO AND INTRAOCULAR LENS PLACEMENT (IOC);  Surgeon: Rutherford Guys, MD;  Location: [facility];  Service: Ophthalmology;  Laterality: Right;  CDE:3.81  . Cataract extraction w/phaco Left 12/13/2014    Procedure: CATARACT EXTRACTION PHACO AND INTRAOCULAR LENS PLACEMENT (IOC);  Surgeon: Rutherford Guys, MD;  Location: [facility];  Service: Ophthalmology;  Laterality: Left;  CDE:6.59  . I&d extremity Right 06/13/2015    Procedure: MINOR IRRIGATION AND DEBRIDEMENT EXTREMITY REMOVAL OF NAIL;  Surgeon: Daryll Brod, MD;  Location: [facility];  Service: Orthopedics;  Laterality: Right;   Family  History  Problem Relation Age of Onset  . Heart disease Mother   . COPD Mother   . Cancer Mother     Breast cancer  . Diabetes Mother   . Heart disease Father   . COPD Sister   . Heart disease Sister   . Diabetes Sister   . Heart disease Maternal Grandmother    Social History  Substance Use Topics  . Smoking status: Former Smoker -- 2.00 packs/day for 14 years    Types: Cigarettes    Quit date: 04/12/1989  . Smokeless tobacco: None  . Alcohol Use: No   OB History    No data available     Review of Systems  All other systems reviewed and are negative.     Allergies  Ancef; Augmentin; Ciprofloxacin; Haldol; Levaquin; Lyrica; Nsaids; Tamiflu; Topiramate; and Zoloft  Home Medications   Prior to Admission medications   Medication Sig Start Date End Date Taking?  Authorizing Provider  albuterol (PROVENTIL) (2.5 MG/3ML) 0.083% nebulizer solution Take 3 mLs (2.5 mg total) by nebulization every 6 (six) hours as needed for wheezing or shortness of breath. 05/01/15   Chesley Mires, MD  atorvastatin (LIPITOR) 20 MG tablet Take 1 tablet (20 mg total) by mouth daily. 06/02/14   Thurnell Lose, MD  budesonide (PULMICORT) 0.5 MG/2ML nebulizer solution Take 2 mLs (0.5 mg total) by nebulization 2 (two) times daily. DX J43.8 05/01/15   Chesley Mires, MD  cetirizine (ZYRTEC) 10 MG tablet Take 10 mg by mouth daily.    Historical Provider, MD  diltiazem (CARDIZEM) 30 MG tablet Take 1 tablet (30 mg total) by mouth 2 (two) times daily. 05/08/15   Pixie Casino, MD  diphenhydrAMINE (BENADRYL) 25 mg capsule Take 25 mg by mouth every 6 (six) hours as needed for itching or allergies.     Historical Provider, MD  docusate sodium (COLACE) 100 MG capsule Take 100 mg by mouth daily.    Historical Provider, MD  escitalopram (LEXAPRO) 10 MG tablet Take 10 mg by mouth at bedtime.     Historical Provider, MD  famotidine (PEPCID) 40 MG tablet Take 20 mg by mouth 2 (two) times daily.     Historical Provider, MD  fenofibrate 54 MG tablet Take 1 tablet (54 mg total) by mouth daily. 05/15/15   Philemon Kingdom, MD  fluticasone (FLONASE) 50 MCG/ACT nasal spray Place 1 spray into both nostrils daily as needed for allergies or rhinitis.     Historical Provider, MD  furosemide (LASIX) 40 MG tablet Take 1.5 tablets (60 mg total) by mouth 2 (two) times daily. 06/01/15   Pixie Casino, MD  HYDROcodone-acetaminophen (NORCO) 5-325 MG tablet Take 1 tablet by mouth every 6 (six) hours as needed for moderate pain. 06/13/15   Daryll Brod, MD  Insulin Glargine (TOUJEO SOLOSTAR) 300 UNIT/ML SOPN Inject 80 Units into the skin at bedtime. 05/15/15   Philemon Kingdom, MD  insulin lispro (HUMALOG KWIKPEN) 100 UNIT/ML KiwkPen INJECT 20 UNITS INTO TWICE DAILY AND 28 UNITS IN THE EVENING. 05/15/15   Philemon Kingdom,  MD  Insulin Pen Needle (BD PEN NEEDLE NANO U/F) 32G X 4 MM MISC USE TO INJECT INSULIN 4 TIMES DAILY. 05/15/15   Philemon Kingdom, MD  Insulin Pen Needle 32G X 4 MM MISC Use to inject insulin 4 times daily. 05/02/15   Philemon Kingdom, MD  metolazone (ZAROXOLYN) 2.5 MG tablet Take 1 tablet (2.5 mg total) by mouth daily. 05/08/15   Pixie Casino, MD  mirtazapine (REMERON) 15 MG tablet Take 15 mg by mouth at bedtime.    Historical Provider, MD  modafinil (PROVIGIL) 200 MG tablet Take 1 tablet (200 mg total) by mouth daily. 05/19/15   Chesley Mires, MD  montelukast (SINGULAIR) 10 MG tablet Take 1 tablet (10 mg total) by mouth at bedtime. 05/01/15   Chesley Mires, MD  nitroGLYCERIN (NITROSTAT) 0.4 MG SL tablet Place 1 tablet (0.4 mg total) under the tongue every 5 (five) minutes as needed for chest pain. 04/21/15   Pixie Casino, MD  nystatin (MYCOSTATIN/NYSTOP) 100000 UNIT/GM POWD Apply topically 2 (two) times daily. USES UNDERNEATH BREAST    Historical Provider, MD  ondansetron (ZOFRAN-ODT) 4 MG disintegrating tablet Take 4 mg by mouth every 8 (eight) hours as needed for nausea or vomiting.    Historical Provider, MD  oxyCODONE (OXY IR/ROXICODONE) 5 MG immediate release tablet Take 5 mg by mouth 2 (two) times daily.    Historical Provider, MD  OXYGEN Inhale 3 L into the lungs continuous. 3 liters 24/7    Historical Provider, MD  pantoprazole (PROTONIX) 40 MG tablet Take 1 tablet (40 mg total) by mouth daily. 06/02/14   Thurnell Lose, MD  potassium chloride SA (K-DUR,KLOR-CON) 20 MEQ tablet Take 1 tablet (20 mEq total) by mouth 3 (three) times daily. 05/08/15   Pixie Casino, MD  primidone (MYSOLINE) 50 MG tablet Take 1 tablet (50 mg total) by mouth at bedtime. 06/08/15   Eustace Quail Tat, DO  rivaroxaban (XARELTO) 20 MG TABS tablet Take 1 tablet (20 mg total) by mouth daily with supper. 05/08/15   Pixie Casino, MD  sulfamethoxazole-trimethoprim (BACTRIM) 400-80 MG tablet Take 1 tablet by mouth 2 (two)  times daily. 06/13/15   Daryll Brod, MD   BP 107/81 mmHg  Pulse 91  Temp(Src) 99.4 F (37.4 C) (Oral)  Resp 18  SpO2 99% Physical Exam  Constitutional: She is oriented to person, place, and time.  Pale, chronically ill appearing  HENT:  Right Ear: External ear normal.  Left Ear: External ear normal.  Nose: Nose normal.  Mouth/Throat: Oropharynx is clear and moist. No oropharyngeal exudate.  edentulous  Eyes: Conjunctivae and EOM are normal. Pupils are equal, round, and reactive to light.  Neck: Normal range of motion. Neck supple.  Cardiovascular: Normal heart sounds and intact distal pulses.  An irregularly irregular rhythm present. Tachycardia present.   Pulmonary/Chest: Effort normal. No respiratory distress. She has decreased breath sounds. She has no wheezes. She has no rhonchi. She has no rales. She exhibits no tenderness.  Abdominal: Soft. Bowel sounds are normal. She exhibits no distension. There is no tenderness. There is no rebound and no guarding.  Musculoskeletal: She exhibits no edema.  Neurological: She is alert and oriented to person, place, and time. No cranial nerve deficit.  Skin: Skin is warm and dry. There is pallor.  Psychiatric: She has a normal mood and affect.  Nursing note and vitals reviewed.   ED Course  Procedures (including critical care time) Labs Review Labs Reviewed  BASIC METABOLIC PANEL - Abnormal; Notable for the following:    Chloride 88 (*)    CO2 34 (*)    Glucose, Bld 164 (*)    BUN 58 (*)    Creatinine, Ser 1.78 (*)    Calcium 8.7 (*)    GFR calc non Af Amer 29 (*)    GFR calc Af Amer 34 (*)    All other components within normal limits  CBC - Abnormal; Notable for the following:    WBC 17.4 (*)    RBC 2.02 (*)    Hemoglobin 5.6 (*)    HCT 17.5 (*)    All other components within normal limits  BRAIN NATRIURETIC PEPTIDE - Abnormal; Notable for the following:    B Natriuretic Peptide 216.5 (*)    All other components within normal  limits  PROTIME-INR - Abnormal; Notable for the following:    Prothrombin Time 19.2 (*)    INR 1.61 (*)    All other components within normal limits  POC OCCULT BLOOD, ED - Abnormal; Notable for the following:    Fecal Occult Bld POSITIVE (*)    All other components within normal limits  APTT  OCCULT BLOOD X 1 CARD TO LAB, STOOL  I-STAT TROPOININ, ED  TYPE AND SCREEN  PREPARE RBC (CROSSMATCH)    Imaging Review Dg Chest 2 View  06/24/2015  CLINICAL DATA:  Shortness of breath, bilateral lower extremity swelling for 3 days EXAM: CHEST  2 VIEW COMPARISON:  03/30/2015 FINDINGS: Moderately severe cardiac enlargement. Mild to moderate vascular congestion. These findings are similar to the prior study although vascular congestion is mildly more prominent. There is no evidence of pulmonary edema. No consolidation or effusion. IMPRESSION: Vascular congestion with cardiac enlargement but no evidence of pulmonary edema Electronically Signed   By: Skipper Cliche M.D.   On: 06/24/2015 17:48   I have personally reviewed and evaluated these images and lab results as part of my medical decision-making.   EKG Interpretation   Date/Time:  Saturday June 24 2015 17:16:05 EST Ventricular Rate:  113 PR Interval:    QRS Duration: 82 QT Interval:  360 QTC Calculation: 493 R Axis:   -79 Text Interpretation:  Atrial fibrillation with rapid ventricular response  with premature ventricular or aberrantly conducted complexes Left axis  deviation Low voltage QRS Cannot rule out Anteroseptal infarct , age  undetermined Abnormal ECG ED PHYSICIAN INTERPRETATION AVAILABLE IN [place]  [place] Confirmed by TEST, Record ([id number]) on 06/25/2015 10:36:38 AM      MDM   Final diagnoses:  Gastrointestinal hemorrhage, unspecified gastritis, unspecified gastrointestinal hemorrhage type  SOB (shortness of breath)    Lab called with critical hemoglobin 5.6. Baseline is usually between 10-11. Upon re-assessment pt  reports black tarry stools since yesterday. FOBT sent to lab. On digital exam she has some brown-yellow stool on glove, no gross blood, no melena. No abdominal tenderness. Orders placed to start blood transfusion. Currently maintaining good BP. Pt started on 1L bolus with NS infusion. 40mg  protonix IV given. Pt also with AKI likely due to dehydration with Cr 1.78 and BUN 58. Suspect with fluid repletion her hemoglobin would actually be even lower. White count 17.4, BNP 216.  Heme occult positive. Pt continues to be hemondynamically stable. PRBC in process. Discussed with attending Dr. Lita Mains. Will consult hospitalist for medicine admission.  Spoke to Dr. Tamala Julian who will admit to hopsitalist service. Hold Xarelto.   Anne Ng, PA-C 06/25/15 1113  Julianne Rice, MD 06/25/15 [phone]

## 2015-06-25 ENCOUNTER — Inpatient Hospital Stay (HOSPITAL_COMMUNITY): Payer: Medicare Other

## 2015-06-25 ENCOUNTER — Encounter (HOSPITAL_COMMUNITY): Payer: Self-pay | Admitting: *Deleted

## 2015-06-25 DIAGNOSIS — N189 Chronic kidney disease, unspecified: Secondary | ICD-10-CM

## 2015-06-25 DIAGNOSIS — E1365 Other specified diabetes mellitus with hyperglycemia: Secondary | ICD-10-CM

## 2015-06-25 DIAGNOSIS — E1322 Other specified diabetes mellitus with diabetic chronic kidney disease: Secondary | ICD-10-CM

## 2015-06-25 DIAGNOSIS — J438 Other emphysema: Secondary | ICD-10-CM

## 2015-06-25 DIAGNOSIS — I482 Chronic atrial fibrillation: Secondary | ICD-10-CM

## 2015-06-25 DIAGNOSIS — K921 Melena: Principal | ICD-10-CM

## 2015-06-25 DIAGNOSIS — G4733 Obstructive sleep apnea (adult) (pediatric): Secondary | ICD-10-CM

## 2015-06-25 DIAGNOSIS — N179 Acute kidney failure, unspecified: Secondary | ICD-10-CM

## 2015-06-25 DIAGNOSIS — N183 Chronic kidney disease, stage 3 (moderate): Secondary | ICD-10-CM

## 2015-06-25 DIAGNOSIS — D62 Acute posthemorrhagic anemia: Secondary | ICD-10-CM

## 2015-06-25 LAB — GLUCOSE, CAPILLARY
GLUCOSE-CAPILLARY: 150 mg/dL — AB (ref 65–99)
GLUCOSE-CAPILLARY: 232 mg/dL — AB (ref 65–99)
Glucose-Capillary: 150 mg/dL — ABNORMAL HIGH (ref 65–99)

## 2015-06-25 LAB — BASIC METABOLIC PANEL
ANION GAP: 12 (ref 5–15)
BUN: 48 mg/dL — AB (ref 6–20)
CALCIUM: 8.5 mg/dL — AB (ref 8.9–10.3)
CO2: 35 mmol/L — AB (ref 22–32)
CREATININE: 1.42 mg/dL — AB (ref 0.44–1.00)
Chloride: 93 mmol/L — ABNORMAL LOW (ref 101–111)
GFR calc Af Amer: 44 mL/min — ABNORMAL LOW (ref >60)
GFR, EST NON AFRICAN AMERICAN: 38 mL/min — AB (ref >60)
GLUCOSE: 151 mg/dL — AB (ref 65–99)
Potassium: 2.9 mmol/L — ABNORMAL LOW (ref 3.5–5.1)
Sodium: 140 mmol/L (ref 135–145)

## 2015-06-25 LAB — CBC
HCT: 25.6 % — ABNORMAL LOW (ref 36.0–46.0)
HEMOGLOBIN: 8.6 g/dL — AB (ref 12.0–15.0)
MCH: 29.3 pg (ref 26.0–34.0)
MCHC: 33.6 g/dL (ref 30.0–36.0)
MCV: 87.1 fL (ref 78.0–100.0)
PLATELETS: 293 10*3/uL (ref 150–400)
RBC: 2.94 MIL/uL — ABNORMAL LOW (ref 3.87–5.11)
RDW: 13.8 % (ref 11.5–15.5)
WBC: 14.9 10*3/uL — ABNORMAL HIGH (ref 4.0–10.5)

## 2015-06-25 LAB — HEMOGLOBIN AND HEMATOCRIT, BLOOD
HEMATOCRIT: 29.1 % — AB (ref 36.0–46.0)
HEMATOCRIT: 25.1 % — AB (ref 36.0–46.0)
HEMOGLOBIN: 8.9 g/dL — AB (ref 12.0–15.0)
HEMOGLOBIN: 8.4 g/dL — AB (ref 12.0–15.0)

## 2015-06-25 LAB — MRSA PCR SCREENING: MRSA BY PCR: NEGATIVE

## 2015-06-25 MED ORDER — IPRATROPIUM-ALBUTEROL 0.5-2.5 (3) MG/3ML IN SOLN
3.0000 mL | Freq: Three times a day (TID) | RESPIRATORY_TRACT | Status: DC
  Administered 2015-06-25 – 2015-06-26 (×5): 3 mL via RESPIRATORY_TRACT
  Filled 2015-06-25 (×3): qty 3
  Filled 2015-06-25: qty 39
  Filled 2015-06-25: qty 3

## 2015-06-25 MED ORDER — PANTOPRAZOLE SODIUM 40 MG IV SOLR
40.0000 mg | Freq: Two times a day (BID) | INTRAVENOUS | Status: DC
  Administered 2015-06-25 – 2015-06-26 (×3): 40 mg via INTRAVENOUS
  Filled 2015-06-25 (×3): qty 40

## 2015-06-25 MED ORDER — INSULIN ASPART 100 UNIT/ML ~~LOC~~ SOLN
0.0000 [IU] | SUBCUTANEOUS | Status: DC
  Administered 2015-06-25: 1 [IU] via SUBCUTANEOUS
  Administered 2015-06-25: 2 [IU] via SUBCUTANEOUS
  Administered 2015-06-25: 3 [IU] via SUBCUTANEOUS
  Administered 2015-06-25: 1 [IU] via SUBCUTANEOUS

## 2015-06-25 MED ORDER — METOLAZONE 2.5 MG PO TABS
2.5000 mg | ORAL_TABLET | Freq: Every day | ORAL | Status: DC
  Administered 2015-06-26: 2.5 mg via ORAL
  Filled 2015-06-25: qty 1

## 2015-06-25 MED ORDER — HYDROCODONE-ACETAMINOPHEN 5-325 MG PO TABS
1.0000 | ORAL_TABLET | Freq: Four times a day (QID) | ORAL | Status: DC | PRN
  Administered 2015-06-25: 1 via ORAL
  Filled 2015-06-25: qty 1

## 2015-06-25 MED ORDER — FUROSEMIDE 80 MG PO TABS: 80.0000 mg | ORAL_TABLET | Freq: Two times a day (BID) | ORAL | Status: DC

## 2015-06-25 MED ORDER — FUROSEMIDE 80 MG PO TABS
80.0000 mg | ORAL_TABLET | Freq: Two times a day (BID) | ORAL | Status: DC
  Administered 2015-06-26: 80 mg via ORAL
  Filled 2015-06-25: qty 1

## 2015-06-25 MED ORDER — FUROSEMIDE 10 MG/ML IJ SOLN
20.0000 mg | Freq: Once | INTRAMUSCULAR | Status: AC
  Administered 2015-06-25: 20 mg via INTRAVENOUS
  Filled 2015-06-25: qty 2

## 2015-06-25 NOTE — Consult Note (Signed)
Referring Provider:  [facility]     `` Primary Care Physician:  London Pepper, MD Primary Gas  troenterologist:  unassigned  Reason for Consultation:  Symptomatic anemia, melena     HPI: Denise Freeman is a 65 y.o. female with past medical history significant for oxygen-dependent COPD for which she is on 3 L nasal cannula oxygen, asthma, insulin-dependent diabetes, CHF, A. fib on Xarelto, obstructive sleep apnea on BiPAP, fibromyalgia, and osteoarthritis. She is followed by pain management on [employer]. patient states she relocated to [place] from [place] several months ago. She has a history of stress-induced cardiomyopathy in the past. She has had multiple cardiac catheterizations none of which showed obstructive coronary disease. Her last cardiac cath was in October 2012 and demonstrated no significant coronary artery disease. She does have permanent atrial fibrillation for which she was on Coumadin. This was changed to Xarelto several months ago. She is status post a hospitalization in December for shortness of breath and probable pneumonia with diastolic heart failure. She has been on Lasix and metolazone.   She  presented to the emergency room last evening with complaints of shortness of breath. She had changed from [place] to a mail away pharmacy and states there was some mix up in the dose of her Lasix. She apparently was taking a lower dose than she should've been. She had been noticing swelling of her ankles. She also reports that on the 20th she had several jet black bowel movements. She denies epigastric pain, nausea, or vomiting. She states she does have a history of ulcers, GERD and hiatal hernia and last had an EGD 5 or 6 years ago in [place]. She reports that she had been on Nexium for many years but approximately 5 years ago her insurance stopped covering it and so she has not been on a PPI since. She has been using famotidine as needed.  2 days ago and yesterday she had jet black stools. Today she denies abdominal pain but states she feels very nauseous. Upon arrival to the emergency room she was noted to have a hemoglobin of 5.6 with a BUN of 58 and creatinine of 1.78. Stools were noted to be guaiac-positive. Patient also states that she last had a colonoscopy 5 or 6 years ago elsewhere [place] at which time a polyp was removed. She states she was advised to have surveillance in 5 years but has not as yet because she does not want to do a bowel prep.     Past Medical History  Diagnosis Date  . Diabetes mellitus without complication ([place])   . Bipolar 1 disorder ([place])   . Fibromyalgia   . COPD (chronic obstructive pulmonary disease) ([place])   . CHF (congestive heart failure) ([place])   . Asthma   . Atrial fibrillation ([place])   . Neuropathy ([facility])     Disc Back   . Fibromyalgia   . CHF (congestive heart failure) ([place])   . Coronary artery disease   . Anemia   . History of hiatal hernia   . GERD (gastroesophageal reflux disease)   . Shortness of breath dyspnea   . Family history of adverse reaction to anesthesia     Uncle was positive for malignant hyerthermia; patient had testing done and was negative.  . Myocardial infarction ([place]) 1990  . Sleep apnea     uses biPAP, 10  . Arthritis   . HOH (hard of hearing)     Past  Surgical History  Procedure Laterality Date  . Heel spur surgery Bilateral   . Appendectomy    . Bladder suspension      2003, 2006 and 2010  . Neck surgery N/A 2009    4, 6, and 7 cervical disc replaced  . Bladder stimulator      pt states, "it cannot be turned off".  . Hernia repair      umbilical  . Cataract extraction w/phaco Right 11/29/2014    Procedure: CATARACT EXTRACTION PHACO AND INTRAOCULAR LENS PLACEMENT (IOC);  Surgeon: Rutherford Guys, MD;  Location: [facility];  Service: Ophthalmology;  Laterality: Right;  CDE:3.81  . Cataract extraction w/phaco Left 12/13/2014    Procedure: CATARACT  EXTRACTION PHACO AND INTRAOCULAR LENS PLACEMENT (IOC);  Surgeon: Rutherford Guys, MD;  Location: [facility];  Service: Ophthalmology;  Laterality: Left;  CDE:6.59  . I&d extremity Right 06/13/2015    Procedure: MINOR IRRIGATION AND DEBRIDEMENT EXTREMITY REMOVAL OF NAIL;  Surgeon: Daryll Brod, MD;  Location: [facility];  Service: Orthopedics;  Laterality: Right;    Prior to Admission medications   Medication Sig Start Date End Date Taking? Authorizing Provider  albuterol (PROVENTIL) (2.5 MG/3ML) 0.083% nebulizer solution Take 3 mLs (2.5 mg total) by nebulization every 6 (six) hours as needed for wheezing or shortness of breath. 05/01/15  Yes Chesley Mires, MD  atorvastatin (LIPITOR) 20 MG tablet Take 1 tablet (20 mg total) by mouth daily. 06/02/14  Yes Thurnell Lose, MD  budesonide (PULMICORT) 0.5 MG/2ML nebulizer solution Take 2 mLs (0.5 mg total) by nebulization 2 (two) times daily. DX J43.8 05/01/15  Yes Chesley Mires, MD  cetirizine (ZYRTEC) 10 MG tablet Take 10 mg by mouth daily.   Yes Historical Provider, MD  diltiazem (CARDIZEM) 30 MG tablet Take 1 tablet (30 mg total) by mouth 2 (two) times daily. 05/08/15  Yes Pixie Casino, MD  diphenhydrAMINE (BENADRYL) 25 mg capsule Take 25 mg by mouth 2 (two) times daily.    Yes Historical Provider, MD  docusate sodium (COLACE) 100 MG capsule Take 100 mg by mouth daily.   Yes Historical Provider, MD  escitalopram (LEXAPRO) 10 MG tablet Take 10 mg by mouth at bedtime.    Yes Historical Provider, MD  famotidine (PEPCID) 20 MG tablet Take 20 mg by mouth 2 (two) times daily.   Yes Historical Provider, MD  fenofibrate 54 MG tablet Take 1 tablet (54 mg total) by mouth daily. 05/15/15  Yes Philemon Kingdom, MD  fluticasone (FLONASE) 50 MCG/ACT nasal spray Place 1 spray into both nostrils daily as needed for allergies or rhinitis.    Yes Historical Provider, MD  furosemide (LASIX) 40 MG tablet Take 1.5 tablets (60 mg total) by mouth 2 (two) times  daily. Patient taking differently: Take 80 mg by mouth 2 (two) times daily.  06/01/15  Yes Pixie Casino, MD  Insulin Glargine (TOUJEO SOLOSTAR) 300 UNIT/ML SOPN Inject 80 Units into the skin at bedtime. 05/15/15  Yes Philemon Kingdom, MD  insulin lispro (HUMALOG KWIKPEN) 100 UNIT/ML KiwkPen INJECT 20 UNITS INTO TWICE DAILY AND 28 UNITS IN THE EVENING. Patient taking differently: Inject 22-28 Units into the skin 3 (three) times daily with meals. Inject 22 units subcutaneously daily with breakfast, 24 units with lunch, 28 units with supper 05/15/15  Yes Philemon Kingdom, MD  metolazone (ZAROXOLYN) 2.5 MG tablet Take 1 tablet (2.5 mg total) by mouth daily. 05/08/15  Yes Pixie Casino, MD  mirtazapine (REMERON) 15 MG tablet Take  15 mg by mouth at bedtime.   Yes Historical Provider, MD  modafinil (PROVIGIL) 200 MG tablet Take 1 tablet (200 mg total) by mouth daily. 05/19/15  Yes Chesley Mires, MD  montelukast (SINGULAIR) 10 MG tablet Take 1 tablet (10 mg total) by mouth at bedtime. 05/01/15  Yes Chesley Mires, MD  nitroGLYCERIN (NITROSTAT) 0.4 MG SL tablet Place 1 tablet (0.4 mg total) under the tongue every 5 (five) minutes as needed for chest pain. 04/21/15  Yes Pixie Casino, MD  nystatin (MYCOSTATIN/NYSTOP) 100000 UNIT/GM POWD Apply topically 2 (two) times daily. Apply under breasts   Yes Historical Provider, MD  ondansetron (ZOFRAN-ODT) 4 MG disintegrating tablet Take 4 mg by mouth every 8 (eight) hours as needed for nausea or vomiting.   Yes Historical Provider, MD  oxyCODONE (OXY IR/ROXICODONE) 5 MG immediate release tablet Take 5 mg by mouth 2 (two) times daily.   Yes Historical Provider, MD  OXYGEN Inhale 3 L into the lungs continuous. 3 liters 24/7   Yes Historical Provider, MD  potassium chloride SA (K-DUR,KLOR-CON) 20 MEQ tablet Take 1 tablet (20 mEq total) by mouth 3 (three) times daily. 05/08/15  Yes Pixie Casino, MD  PRESCRIPTION MEDICATION Inhale into the lungs See admin instructions.  Use BIPAP every time laying down   Yes Historical Provider, MD  primidone (MYSOLINE) 50 MG tablet Take 1 tablet (50 mg total) by mouth at bedtime. 06/08/15  Yes Rebecca S Tat, DO  Probiotic Product (PROBIOTIC PO) Take 1 tablet by mouth at bedtime.   Yes Historical Provider, MD  rivaroxaban (XARELTO) 20 MG TABS tablet Take 1 tablet (20 mg total) by mouth daily with supper. Patient taking differently: Take 20 mg by mouth at bedtime.  05/08/15  Yes Pixie Casino, MD  HYDROcodone-acetaminophen (NORCO) 5-325 MG tablet Take 1 tablet by mouth every 6 (six) hours as needed for moderate pain. Patient not taking: Reported on 06/24/2015 06/13/15   Daryll Brod, MD  Insulin Pen Needle (BD PEN NEEDLE NANO U/F) 32G X 4 MM MISC USE TO INJECT INSULIN 4 TIMES DAILY. 05/15/15   Philemon Kingdom, MD  Insulin Pen Needle 32G X 4 MM MISC Use to inject insulin 4 times daily. 05/02/15   Philemon Kingdom, MD  pantoprazole (PROTONIX) 40 MG tablet Take 1 tablet (40 mg total) by mouth daily. Patient not taking: Reported on 06/24/2015 06/02/14   Thurnell Lose, MD  sulfamethoxazole-trimethoprim (BACTRIM) 400-80 MG tablet Take 1 tablet by mouth 2 (two) times daily. Patient not taking: Reported on 06/24/2015 06/13/15   Daryll Brod, MD    Current Facility-Administered Medications  Medication Dose Route Frequency Provider Last Rate Last Dose  . albuterol (PROVENTIL) (2.5 MG/3ML) 0.083% nebulizer solution 2.5 mg  2.5 mg Nebulization Q6H PRN Norval Morton, MD   2.5 mg at 06/25/15 0300  . arformoterol (BROVANA) nebulizer solution 15 mcg  15 mcg Nebulization BID Norval Morton, MD   15 mcg at 06/25/15 0300  . atorvastatin (LIPITOR) tablet 20 mg  20 mg Oral Daily Norval Morton, MD   20 mg at 06/25/15 0942  . budesonide (PULMICORT) nebulizer solution 0.5 mg  0.5 mg Nebulization BID Norval Morton, MD   0.5 mg at 06/25/15 0756  . diltiazem (CARDIZEM) tablet 30 mg  30 mg Oral BID Norval Morton, MD   30 mg at 06/25/15 0942  .  diphenhydrAMINE (BENADRYL) capsule 25 mg  25 mg Oral Q6H PRN Norval Morton, MD      .  escitalopram (LEXAPRO) tablet 10 mg  10 mg Oral QHS Norval Morton, MD   10 mg at 06/25/15 0242  . fenofibrate tablet 54 mg  54 mg Oral Daily Norval Morton, MD   54 mg at 06/25/15 0942  . fluticasone (FLONASE) 50 MCG/ACT nasal spray 1 spray  1 spray Each Nare Daily PRN Norval Morton, MD      . Derrill Memo ON 06/26/2015] furosemide (LASIX) tablet 80 mg  80 mg Oral BID Thurnell Lose, MD      . insulin aspart (novoLOG) injection 0-9 Units  0-9 Units Subcutaneous Q4H Thurnell Lose, MD   1 Units at 06/25/15 [phone]  . ipratropium-albuterol (DUONEB) 0.5-2.5 (3) MG/3ML nebulizer solution 3 mL  3 mL Nebulization TID Norval Morton, MD   3 mL at 06/25/15 0756  . [START ON 06/26/2015] metolazone (ZAROXOLYN) tablet 2.5 mg  2.5 mg Oral Daily Thurnell Lose, MD      . mirtazapine (REMERON) tablet 15 mg  15 mg Oral QHS Norval Morton, MD   15 mg at 06/25/15 0242  . modafinil (PROVIGIL) tablet 200 mg  200 mg Oral Daily Norval Morton, MD   200 mg at 06/25/15 0943  . nitroGLYCERIN (NITROSTAT) SL tablet 0.4 mg  0.4 mg Sublingual Q5 min PRN Norval Morton, MD      . nystatin (MYCOSTATIN/NYSTOP) topical powder   Topical BID Norval Morton, MD      . oxyCODONE (Oxy IR/ROXICODONE) immediate release tablet 5 mg  5 mg Oral BID PRN Norval Morton, MD   5 mg at 06/25/15 0242  . pantoprazole (PROTONIX) injection 40 mg  40 mg Intravenous Q12H Thurnell Lose, MD   40 mg at 06/25/15 0829  . potassium chloride SA (K-DUR,KLOR-CON) CR tablet 20 mEq  20 mEq Oral TID Norval Morton, MD   20 mEq at 06/25/15 0943  . primidone (MYSOLINE) tablet 50 mg  50 mg Oral QHS Norval Morton, MD   50 mg at 06/24/15 2330    Allergies as of 06/24/2015 - Review Complete 06/24/2015  Allergen Reaction Noted  . Ancef [cefazolin] Nausea And Vomiting 04/12/2014  . Augmentin [amoxicillin-pot clavulanate] Itching 02/05/2014  . Ciprofloxacin Itching  02/05/2014  . Haldol [haloperidol] Other (See Comments) 02/05/2014  . Levaquin [levofloxacin in d5w] Other (See Comments) 02/05/2014  . Lyrica [pregabalin] Hives 02/20/2015  . Nsaids Diarrhea 05/13/2014  . Tamiflu [oseltamivir phosphate] Other (See Comments) 04/12/2014  . Topiramate Nausea Only 04/04/2015  . Zoloft [sertraline hcl] Other (See Comments) 02/05/2014  . Penicillins Itching, Nausea And Vomiting, and Rash 06/24/2015    Family History  Problem Relation Age of Onset  . Heart disease Mother   . COPD Mother   . Cancer Mother     Breast cancer  . Diabetes Mother   . Heart disease Father   . COPD Sister   . Heart disease Sister   . Diabetes Sister   . Heart disease Maternal Grandmother     Social History   Social History  . Marital Status: Married    Spouse Name: N/A  . Number of Children: N/A  . Years of Education: N/A   Occupational History  . house wife    Social History Main Topics  . Smoking status: Former Smoker -- 2.00 packs/day for 14 years    Types: Cigarettes    Quit date: 04/12/1989  . Smokeless tobacco: Former Systems developer  . Alcohol Use: No  . Drug  Use: No  . Sexual Activity: No   Other Topics Concern  . Not on file   Social History Narrative   Married   Disabled   1 child; 51 yrs   6 pregnancies   1 child       Review of Systems: Gen: admits to fatigue, weakness and malaise.  CV: admits to swelling of legs and orthopnea Resp admits to dyspnea at rest and with exertion.  GI: admits to nausea. Admits to dark stools  GU : admits to urinary frequency  MS: admits to pain in all her joints "that's why ago to pain management"  Derm: Denies rash, itching, dry skin, hives, moles, warts, or unhealing ulcers.  Psych:  admits to insomnia  Neuro: Admits to weakness. Endo:  Denies any problems with thyroid, adrenal function.  Physical Exam: Vital signs in last 24 hours: Temp:  [97.8 F (36.6 C)-99.4 F (37.4 C)] 99 F (37.2 C) (01/22 0700) Pulse  Rate:  [51-110] 87 (01/22 0700) Resp:  [14-27] 20 (01/22 0700) BP: (97-147)/(48-106) 139/60 mmHg (01/22 0748) SpO2:  [95 %-100 %] 100 % (01/22 0758) Weight:  [216 lb 11.2 oz (98.294 kg)] 216 lb 11.2 oz (98.294 kg) (01/22 0244) Last BM Date: 06/24/15 General:   Alert, obese, Caucasian female with nasal cannula in place. Head:  Normocephalic and atraumatic. Eyes:  Sclera clear, no icterus. Conjunctiva pink. Ears:  Normal auditory acuity. Nose:  No deformity, discharge,  or lesions. Mouth:  No deformity or lesions.   mucous membranes dry  Neck:  Supple; no masses or thyromegaly. Lungs:  diminished breath sounds bilaterally  Heart:  irregularly irregular Abdomen:  Soft,nontender, BS active,nonpalp mass or hsm.   Rectal:  Deferred  Msk:  Symmetrical without gross deformities. . Pulses:  Normal pulses noted. Extremities 1+ lower extremity edema Neurologic: Alert and  oriented x4;  grossly normal neurologically. Skin: Intact without significant lesions or rashes.. Psych:  Alert and cooperative. extremely anxious  Intake/Output from previous day: 01/21 0701 - 01/22 0700 In: 1415 [I.V.:1020; Blood:395] Out: [id number] [Urine:1650] Intake/Output this shift: Total I/O In: -  Out: 400 [Urine:400]  Lab Results:  Recent Labs  06/24/15 1751  WBC 17.4*  HGB 5.6*  HCT 17.5*  PLT 347   BMET  Recent Labs  06/24/15 1751  NA 136  K 3.6  CL 88*  CO2 34*  GLUCOSE 164*  BUN 58*  CREATININE 1.78*  CALCIUM 8.7*    PT/INR  Recent Labs  06/24/15 1751  LABPROT 19.2*  INR 1.61*     Studies/Results: Dg Chest 2 View  06/24/2015  CLINICAL DATA:  Shortness of breath, bilateral lower extremity swelling for 3 days EXAM: CHEST  2 VIEW COMPARISON:  03/30/2015 FINDINGS: Moderately severe cardiac enlargement. Mild to moderate vascular congestion. These findings are similar to the prior study although vascular congestion is mildly more prominent. There is no evidence of pulmonary edema. No  consolidation or effusion. IMPRESSION: Vascular congestion with cardiac enlargement but no evidence of pulmonary edema Electronically Signed   By: Skipper Cliche M.D.   On: 06/24/2015 17:48   Dg Chest Port 1 View  06/25/2015  CLINICAL DATA:  Shortness of breath. EXAM: PORTABLE CHEST 1 VIEW COMPARISON:  06/24/2015 FINDINGS: The cardiac silhouette is enlarged. Mediastinal contours appear intact. There is no evidence of focal airspace consolidation, pleural effusion or pneumothorax. There is pulmonary vascular congestion. Bilateral lower lobe peribronchovascular opacities likely represent areas of atelectasis. Osseous structures are without acute abnormality. Lower cervical spine  fusion is noted. Soft tissues are grossly normal. IMPRESSION: Enlarged cardiac silhouette with pulmonary vascular congestion. Bilateral lower lobe subsegmental atelectasis. Electronically Signed   By: Fidela Salisbury M.D.   On: 06/25/2015 08:27    IMPRESSION/PLAN: 65 year old female with a history of atrial fibrillation on Xarelto, admitted with melena of 1-2 duration and hemoglobin of 5.6, down from 10.7 in October 2016. Stools guaiac positive. BUN elevated at 58, suspect upper GI etiology. Patient has received 3 units of packed cells so far. Monitor hemoglobin and transfuse to keep above 8. Will need to monitor I&O's closely. Hold Xarelto, continue Protonix drip. GI to reevaluate in the morning to determine timing of EGD.  A. fib-Xarelto currently on hold. CHF-last echo in December 2015 had EF 45-50%. COPD-on chronic oxygen. Continue nebulizer treatments Diabetes mellitus. Last hemoglobin A1c was 11.7 Hyperlipidemia Anxiety/depression Obstructive sleep apnea-on BiPAP Acute on chronic kidney injury Acute anemia, likely due to blood loss from upper GI source. Monitor hemoglobin, transfuse to keep hemoglobin above 8.   Hvozdovic, Deloris Ping 06/25/2015,  [id number] after 5p, weekends, holidays     [facility] Attending    I have taken an interval history, reviewed the chart and examined the patient. I agree with the Advanced Practitioner's note, impression and recommendations.    Melena, GERD sxs in patient with Xarelto Tx - and also has acute blood loss anemia  She will need an EGD when Xarelto out of system, may take up to 48 hrs w/ renal insufficiency  Dr. Collene Mares or Benson Norway to arrange EGD when they assume consultative f/u tomorrow - not sure if can be done tomorrow vs Tuesday - await their input. I have held food after 0900 tomorrow in case it can be done.   Gatha Mayer, MD, [facility] [phone] (pager) [phone] after 5 PM, weekends and holidays  06/25/2015 2:32 PM

## 2015-06-25 NOTE — Progress Notes (Signed)
Patient Demographics:    Denise Freeman, is a 65 y.o. female, DOB - [birth year], [id number]  Admit date - 06/24/2015   Admitting Physician Norval Morton, MD  Outpatient Primary MD for the patient is London Pepper, MD  LOS - 1   Chief Complaint  Patient presents with  . Shortness of Breath        Subjective:    Denise Freeman today has, No headache, No chest pain, No abdominal pain - No Nausea, No new weakness tingling or numbness, No Cough - SOB.     Assessment  & Plan :     1. GI Bleed - most likely upper GI, she had melanotic stools with high BUN, was also on xaralto. Hold xaralto, IV PPI, transfused 3 units of packed RBCs on 06/25/2015, monitor H&H closely. GI called.  2. Acute blood loss with anemia. Transfused 3 units on 06/25/2015 Will monitor.  3. Chronic atrial fibrillation. [place] VASC 2 score of 5. Hold xaralto due to an acute bleed, continue diltiazem for rate control.  4. 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 Baseline creatinine appears to be close to 1 - transfuse and monitor. Cutback diuretic dose for one day on 06/25/2015, home dose from 06/26/2015.  5. Chronic mild systolic heart failure last EF 45-50%. Compensated. Diuretics as the number for above.  6. History of COPD. Stable, no wheezing, nebulizer treatments and oxygen as needed.  7. Obstructive sleep apnea. Uses BiPAP at night continue.  8. Leukocytosis. likely reactionary, afebrile, chest x-ray UA stable. Monitor.  9. Dyslipidemia. Continue home dose statin.  10. DM type II. Neck A1c, continue sliding scale for now and monitor.  Lab Results  Component Value Date   HGBA1C 11.7 01/26/2015    CBG (last 3)   Recent Labs  06/25/15 0924  GLUCAP 150*      Code Status : Full  Family Communication  : None  Disposition Plan  :  TBD  Consults  :  GI  Procedures  :   DVT Prophylaxis  :  SCDs    Lab Results  Component Value Date   PLT 347 06/24/2015    Inpatient Medications  Scheduled Meds: . arformoterol  15 mcg Nebulization BID  . atorvastatin  20 mg Oral Daily  . budesonide  0.5 mg Nebulization BID  . diltiazem  30 mg Oral BID  . escitalopram  10 mg Oral QHS  . fenofibrate  54 mg Oral Daily  . furosemide  20 mg Intravenous Once  . furosemide  80 mg Oral BID  . ipratropium-albuterol  3 mL Nebulization TID  . metolazone  2.5 mg Oral Daily  . mirtazapine  15 mg Oral QHS  . modafinil  200 mg Oral Daily  . nystatin   Topical BID  . pantoprazole (PROTONIX) IV  40 mg Intravenous Q12H  . potassium chloride SA  20 mEq Oral TID  . primidone  50 mg Oral QHS   Continuous Infusions:  PRN Meds:.albuterol, diphenhydrAMINE, fluticasone, nitroGLYCERIN, oxyCODONE  Antibiotics  :     Anti-infectives    Start     Dose/Rate Route Frequency Ordered Stop   06/24/15 2330  sulfamethoxazole-trimethoprim (BACTRIM,SEPTRA) 400-80 MG per tablet 1 tablet  Status:  Discontinued  1 tablet Oral 2 times daily 06/24/15 2323 06/25/15 0220        Objective:   Filed Vitals:   06/25/15 0624 06/25/15 0700 06/25/15 0748 06/25/15 0758  BP: 119/58 111/63 139/60   Pulse: 86 87    Temp: 98.1 F (36.7 C) 99 F (37.2 C)    TempSrc: Oral Oral    Resp: 22 20    Height:      Weight:      SpO2: 99% 100%  100%    Wt Readings from Last 3 Encounters:  06/25/15 98.294 kg (216 lb 11.2 oz)  06/07/15 94.802 kg (209 lb)  04/21/15 96.525 kg (212 lb 12.8 oz)     Intake/Output Summary (Last 24 hours) at 06/25/15 0932 Last data filed at 06/25/15 0746  Gross per 24 hour  Intake   1415 ml  Output   2050 ml  Net   -635 ml     Physical Exam  Awake Alert, Oriented X 3, No new F.N deficits, Normal affect [place].AT,PERRAL Supple Neck,No JVD, No cervical lymphadenopathy appriciated.  Symmetrical Chest wall movement, Good air  movement bilaterally, CTAB RRR,No Gallops,Rubs or new Murmurs, No Parasternal Heave +ve B.Sounds, Abd Soft, No tenderness, No organomegaly appriciated, No rebound - guarding or rigidity. No Cyanosis, Clubbing or edema, No new Rash or bruise       Data Review:   Micro Results Recent Results (from the past 240 hour(s))  MRSA PCR Screening     Status: None   Collection Time: 06/25/15  2:32 AM  Result Value Ref Range Status   MRSA by PCR NEGATIVE NEGATIVE Final    Comment:        The GeneXpert MRSA Assay (FDA approved for NASAL specimens only), is one component of a comprehensive MRSA colonization surveillance program. It is not intended to diagnose MRSA infection nor to guide or monitor treatment for MRSA infections.     Radiology Reports Dg Chest 2 View  06/24/2015  CLINICAL DATA:  Shortness of breath, bilateral lower extremity swelling for 3 days EXAM: CHEST  2 VIEW COMPARISON:  03/30/2015 FINDINGS: Moderately severe cardiac enlargement. Mild to moderate vascular congestion. These findings are similar to the prior study although vascular congestion is mildly more prominent. There is no evidence of pulmonary edema. No consolidation or effusion. IMPRESSION: Vascular congestion with cardiac enlargement but no evidence of pulmonary edema Electronically Signed   By: Skipper Cliche M.D.   On: 06/24/2015 17:48   Dg Chest Port 1 View  06/25/2015  CLINICAL DATA:  Shortness of breath. EXAM: PORTABLE CHEST 1 VIEW COMPARISON:  06/24/2015 FINDINGS: The cardiac silhouette is enlarged. Mediastinal contours appear intact. There is no evidence of focal airspace consolidation, pleural effusion or pneumothorax. There is pulmonary vascular congestion. Bilateral lower lobe peribronchovascular opacities likely represent areas of atelectasis. Osseous structures are without acute abnormality. Lower cervical spine fusion is noted. Soft tissues are grossly normal. IMPRESSION: Enlarged cardiac silhouette with  pulmonary vascular congestion. Bilateral lower lobe subsegmental atelectasis. Electronically Signed   By: Fidela Salisbury M.D.   On: 06/25/2015 08:27     CBC  Recent Labs Lab 06/24/15 1751  WBC 17.4*  HGB 5.6*  HCT 17.5*  PLT 347  MCV 86.6  MCH 27.7  MCHC 32.0  RDW 14.2    Chemistries   Recent Labs Lab 06/24/15 1751  NA 136  K 3.6  CL 88*  CO2 34*  GLUCOSE 164*  BUN 58*  CREATININE 1.78*  CALCIUM 8.7*   ------------------------------------------------------------------------------------------------------------------  No results for input(s): CHOL, HDL, LDLCALC, TRIG, CHOLHDL, LDLDIRECT in the last 72 hours.  Lab Results  Component Value Date   HGBA1C 11.7 01/26/2015   ------------------------------------------------------------------------------------------------------------------ No results for input(s): TSH, T4TOTAL, T3FREE, THYROIDAB in the last 72 hours.  Invalid input(s): FREET3 ------------------------------------------------------------------------------------------------------------------ No results for input(s): VITAMINB12, FOLATE, FERRITIN, TIBC, IRON, RETICCTPCT in the last 72 hours.  Coagulation profile  Recent Labs Lab 06/24/15 1751  INR 1.61*    No results for input(s): DDIMER in the last 72 hours.  Cardiac Enzymes No results for input(s): CKMB, TROPONINI, MYOGLOBIN in the last 168 hours.  Invalid input(s): CK ------------------------------------------------------------------------------------------------------------------    Component Value Date/Time   BNP 216.5* 06/24/2015 1751    Time Spent in minutes  35   SINGH,PRASHANT K M.D on 06/25/2015 at 9:32 AM  Between 7am to 7pm - Pager - [phone]  After 7pm go to [email or web address] - password [facility]  [facility] -  Office  [phone]

## 2015-06-25 NOTE — Progress Notes (Signed)
Placed patient on home CPAP with oxygen set at 2lpm  

## 2015-06-25 NOTE — Progress Notes (Signed)
Utilization review completed.  

## 2015-06-26 DIAGNOSIS — I480 Paroxysmal atrial fibrillation: Secondary | ICD-10-CM

## 2015-06-26 LAB — HEMOGLOBIN AND HEMATOCRIT, BLOOD
HCT: 24.7 % — ABNORMAL LOW (ref 36.0–46.0)
HEMOGLOBIN: 8.3 g/dL — AB (ref 12.0–15.0)

## 2015-06-26 LAB — BASIC METABOLIC PANEL
Anion gap: 13 (ref 5–15)
BUN: 36 mg/dL — AB (ref 6–20)
CHLORIDE: 95 mmol/L — AB (ref 101–111)
CO2: 31 mmol/L (ref 22–32)
CREATININE: 1.3 mg/dL — AB (ref 0.44–1.00)
Calcium: 8.6 mg/dL — ABNORMAL LOW (ref 8.9–10.3)
GFR calc Af Amer: 49 mL/min — ABNORMAL LOW (ref >60)
GFR calc non Af Amer: 42 mL/min — ABNORMAL LOW (ref >60)
GLUCOSE: 163 mg/dL — AB (ref 65–99)
Potassium: 3.6 mmol/L (ref 3.5–5.1)
Sodium: 139 mmol/L (ref 135–145)

## 2015-06-26 LAB — CALCIUM, IONIZED: Calcium, Ionized, Serum: 4.5 mg/dL (ref 4.5–5.6)

## 2015-06-26 LAB — GLUCOSE, CAPILLARY
GLUCOSE-CAPILLARY: 152 mg/dL — AB (ref 65–99)
GLUCOSE-CAPILLARY: 159 mg/dL — AB (ref 65–99)
Glucose-Capillary: 126 mg/dL — ABNORMAL HIGH (ref 65–99)
Glucose-Capillary: 152 mg/dL — ABNORMAL HIGH (ref 65–99)

## 2015-06-26 LAB — MAGNESIUM: MAGNESIUM: 2.2 mg/dL (ref 1.7–2.4)

## 2015-06-26 LAB — HEMOGLOBIN A1C
Hgb A1c MFr Bld: 6.6 % — ABNORMAL HIGH (ref 4.8–5.6)
MEAN PLASMA GLUCOSE: 143 mg/dL

## 2015-06-26 MED ORDER — METOLAZONE 2.5 MG PO TABS: 2.5000 mg | ORAL_TABLET | Freq: Every day | ORAL | Status: DC

## 2015-06-26 MED ORDER — INSULIN ASPART 100 UNIT/ML ~~LOC~~ SOLN
0.0000 [IU] | SUBCUTANEOUS | Status: DC
  Administered 2015-06-26 (×2): 1 [IU] via SUBCUTANEOUS
  Administered 2015-06-26: 2 [IU] via SUBCUTANEOUS

## 2015-06-26 MED ORDER — HYDROXYZINE HCL 25 MG PO TABS
25.0000 mg | ORAL_TABLET | Freq: Once | ORAL | Status: AC
  Administered 2015-06-26: 25 mg via ORAL
  Filled 2015-06-26: qty 1

## 2015-06-26 NOTE — Progress Notes (Signed)
Placed patient on home CPAP with oxygen set at 3lpm

## 2015-06-26 NOTE — Progress Notes (Signed)
Patient Demographics:    Denise Freeman, is a 65 y.o. female, DOB - 31-Jan-1951, [id number]  Admit date - 06/24/2015   Admitting Physician Norval Morton, MD  Outpatient Primary MD for the patient is London Pepper, MD  LOS - 2   Chief Complaint  Patient presents with  . Shortness of Breath        Subjective:    Denise Freeman today has, No headache, No chest pain, No abdominal pain - No Nausea, No new weakness tingling or numbness, No Cough - SOB.     Assessment  & Plan :     1. GI Bleed - most likely upper GI, she had melanotic stools with high BUN, was also on xaralto. Hold xaralto, IV PPI, transfused 3 units of packed RBCs on 06/25/2015, now stable H&H closely. GI following likely EGD this admission.  2. Acute blood loss with anemia. Transfused 3 units on 06/25/2015 Will monitor.  3. Chronic atrial fibrillation. [place] VASC 2 score of 5. Hold xaralto due to an acute bleed, continue diltiazem for rate control. Outpatient follow-up with [name] her cardiologist post discharge.  4. 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 Baseline creatinine appears to be close to 1 - transfused and monitor. Much improved.  5. Chronic mild systolic heart failure last EF 45-50%. Compensated. Diuretics as the number for above.  6. History of COPD. Stable, no wheezing, nebulizer treatments and oxygen as needed.  7. Obstructive sleep apnea. Uses BiPAP at night continue.  8. Leukocytosis. likely reactionary, afebrile, chest x-ray UA stable. Monitor.  9. Dyslipidemia. Continue home dose statin.  10. DM type II. Neck A1c, continue sliding scale for now and monitor.  Lab Results  Component Value Date   HGBA1C 6.6* 06/25/2015    CBG (last 3)   Recent Labs  06/25/15 2025 06/26/15 0503 06/26/15 0826  GLUCAP 232* 126* 152*       Code Status : Full  Family Communication  : None  Disposition Plan  : TBD  Consults  :  GI  Procedures  :   DVT Prophylaxis  :  SCDs    Lab Results  Component Value Date   PLT 293 06/25/2015    Inpatient Medications  Scheduled Meds: . arformoterol  15 mcg Nebulization BID  . atorvastatin  20 mg Oral Daily  . budesonide  0.5 mg Nebulization BID  . diltiazem  30 mg Oral BID  . escitalopram  10 mg Oral QHS  . fenofibrate  54 mg Oral Daily  . furosemide  80 mg Oral BID  . insulin aspart  0-9 Units Subcutaneous Q4H  . ipratropium-albuterol  3 mL Nebulization TID  . metolazone  2.5 mg Oral Daily  . mirtazapine  15 mg Oral QHS  . modafinil  200 mg Oral Daily  . nystatin   Topical BID  . pantoprazole (PROTONIX) IV  40 mg Intravenous Q12H  . potassium chloride SA  20 mEq Oral TID  . primidone  50 mg Oral QHS   Continuous Infusions:  PRN Meds:.albuterol, diphenhydrAMINE, fluticasone, HYDROcodone-acetaminophen, nitroGLYCERIN, oxyCODONE  Antibiotics  :     Anti-infectives    Start     Dose/Rate Route Frequency Ordered Stop   06/24/15 2330  sulfamethoxazole-trimethoprim (BACTRIM,SEPTRA) 400-80 MG  per tablet 1 tablet  Status:  Discontinued     1 tablet Oral 2 times daily 06/24/15 2323 06/25/15 0220        Objective:   Filed Vitals:   06/26/15 0517 06/26/15 0734 06/26/15 0828 06/26/15 0900  BP: 140/75  133/72 118/72  Pulse: 91  89 88  Temp: 99 F (37.2 C)  97.9 F (36.6 C)   TempSrc: Oral  Oral   Resp: 22  17 15   Height:      Weight:      SpO2: 99% 100% 99% 98%    Wt Readings from Last 3 Encounters:  06/26/15 96.299 kg (212 lb 4.8 oz)  06/07/15 94.802 kg (209 lb)  04/21/15 96.525 kg (212 lb 12.8 oz)     Intake/Output Summary (Last 24 hours) at 06/26/15 1129 Last data filed at 06/26/15 0944  Gross per 24 hour  Intake    570 ml  Output   2325 ml  Net  -1755 ml     Physical Exam  Awake Alert, Oriented X 3, No new F.N deficits, Normal  affect [place].AT,PERRAL Supple Neck,No JVD, No cervical lymphadenopathy appriciated.  Symmetrical Chest wall movement, Good air movement bilaterally, CTAB RRR,No Gallops,Rubs or new Murmurs, No Parasternal Heave +ve B.Sounds, Abd Soft, No tenderness, No organomegaly appriciated, No rebound - guarding or rigidity. No Cyanosis, Clubbing or edema, No new Rash or bruise       Data Review:   Micro Results Recent Results (from the past 240 hour(s))  MRSA PCR Screening     Status: None   Collection Time: 06/25/15  2:32 AM  Result Value Ref Range Status   MRSA by PCR NEGATIVE NEGATIVE Final    Comment:        The GeneXpert MRSA Assay (FDA approved for NASAL specimens only), is one component of a comprehensive MRSA colonization surveillance program. It is not intended to diagnose MRSA infection nor to guide or monitor treatment for MRSA infections.     Radiology Reports Dg Chest 2 View  06/24/2015  CLINICAL DATA:  Shortness of breath, bilateral lower extremity swelling for 3 days EXAM: CHEST  2 VIEW COMPARISON:  03/30/2015 FINDINGS: Moderately severe cardiac enlargement. Mild to moderate vascular congestion. These findings are similar to the prior study although vascular congestion is mildly more prominent. There is no evidence of pulmonary edema. No consolidation or effusion. IMPRESSION: Vascular congestion with cardiac enlargement but no evidence of pulmonary edema Electronically Signed   By: Skipper Cliche M.D.   On: 06/24/2015 17:48   Dg Chest Port 1 View  06/25/2015  CLINICAL DATA:  Shortness of breath. EXAM: PORTABLE CHEST 1 VIEW COMPARISON:  06/24/2015 FINDINGS: The cardiac silhouette is enlarged. Mediastinal contours appear intact. There is no evidence of focal airspace consolidation, pleural effusion or pneumothorax. There is pulmonary vascular congestion. Bilateral lower lobe peribronchovascular opacities likely represent areas of atelectasis. Osseous structures are without acute  abnormality. Lower cervical spine fusion is noted. Soft tissues are grossly normal. IMPRESSION: Enlarged cardiac silhouette with pulmonary vascular congestion. Bilateral lower lobe subsegmental atelectasis. Electronically Signed   By: Fidela Salisbury M.D.   On: 06/25/2015 08:27     CBC  Recent Labs Lab 06/24/15 1751 06/25/15 1050 06/25/15 1851 06/25/15 2224 06/26/15 0540  WBC 17.4* 14.9*  --   --   --   HGB 5.6* 8.6* 8.9* 8.4* 8.3*  HCT 17.5* 25.6* 29.1* 25.1* 24.7*  PLT 347 293  --   --   --  MCV 86.6 87.1  --   --   --   MCH 27.7 29.3  --   --   --   MCHC 32.0 33.6  --   --   --   RDW 14.2 13.8  --   --   --     Chemistries   Recent Labs Lab 06/24/15 1751 06/25/15 0922 06/26/15 0912  NA 136 140 139  K 3.6 2.9* 3.6  CL 88* 93* 95*  CO2 34* 35* 31  GLUCOSE 164* 151* 163*  BUN 58* 48* 36*  CREATININE 1.78* 1.42* 1.30*  CALCIUM 8.7* 8.5* 8.6*  MG  --   --  2.2   ------------------------------------------------------------------------------------------------------------------ No results for input(s): CHOL, HDL, LDLCALC, TRIG, CHOLHDL, LDLDIRECT in the last 72 hours.  Lab Results  Component Value Date   HGBA1C 6.6* 06/25/2015   ------------------------------------------------------------------------------------------------------------------ No results for input(s): TSH, T4TOTAL, T3FREE, THYROIDAB in the last 72 hours.  Invalid input(s): FREET3 ------------------------------------------------------------------------------------------------------------------ No results for input(s): VITAMINB12, FOLATE, FERRITIN, TIBC, IRON, RETICCTPCT in the last 72 hours.  Coagulation profile  Recent Labs Lab 06/24/15 1751  INR 1.61*    No results for input(s): DDIMER in the last 72 hours.  Cardiac Enzymes No results for input(s): CKMB, TROPONINI, MYOGLOBIN in the last 168 hours.  Invalid input(s):  CK ------------------------------------------------------------------------------------------------------------------    Component Value Date/Time   BNP 216.5* 06/24/2015 1751    Time Spent in minutes  35   SINGH,PRASHANT K M.D on 06/26/2015 at 11:29 AM  Between 7am to 7pm - Pager - [phone]  After 7pm go to [email or web address] - password [facility]  [facility] -  Office  [phone]

## 2015-06-26 NOTE — Progress Notes (Signed)
Patient very upset and is leaving AMA. Paper signed and placed in chart. IV removed and MD notified of patient leaving. Patient escorted out via wheelchair.

## 2015-06-26 NOTE — Evaluation (Signed)
Physical Therapy Evaluation Patient Details Name: Denise Freeman MRN: [id number] DOB: 28-Dec-1950 Today's Date: 06/26/2015   History of Present Illness  Denise Freeman is a 65 year old female with a past medical history significant for insulin-dependent diabetes, COPD, oxygen dependent on 3 L nasal cannula oxygen, asthma, CHF, A. fib on Xarelto, OSA on BiPAP; who presents with complaints of shortness of breath for the last 3-4 days. In ED hgb 5.6 and pt with guaiac pos stool  Clinical Impression  Pt admitted with above diagnosis. Pt currently with functional limitations due to the deficits listed below (see PT Problem List). Pt quite agitated on eval because she has not been allowed to eat. Min A required for transfers and short distance ambulation. O2 sats 98% on 3L O2 after ambulation.  Pt will benefit from skilled PT to increase their independence and safety with mobility to allow discharge to the venue listed below.       Follow Up Recommendations Home health PT    Equipment Recommendations  None recommended by PT    Recommendations for Other Services       Precautions / Restrictions Precautions Precautions: Fall Precaution Comments: just had thumb sx right Restrictions Weight Bearing Restrictions: No      Mobility  Bed Mobility Overal bed mobility: Needs Assistance Bed Mobility: Supine to Sit     Supine to sit: Min assist     General bed mobility comments: min A for trunk elevation from SL to sit  Transfers Overall transfer level: Needs assistance Equipment used: 1 person hand held assist Transfers: Sit to/from Stand Sit to Stand: Min assist         General transfer comment: min A to steady with sit to stand  Ambulation/Gait Ambulation/Gait assistance: Min assist Ambulation Distance (Feet): 15 Feet Assistive device: 1 person hand held assist Gait Pattern/deviations: Step-through pattern;Decreased stride length Gait velocity: decreased Gait velocity  interpretation: Below normal speed for age/gender General Gait Details: pt very hesitant to ambulate so ambulated fwd and back in front of chair with HHA 3x  Stairs            Wheelchair Mobility    Modified Rankin (Stroke Patients Only)       Balance Overall balance assessment: Needs assistance Sitting-balance support: No upper extremity supported Sitting balance-Leahy Scale: Fair     Standing balance support: Bilateral upper extremity supported Standing balance-Leahy Scale: Poor                               Pertinent Vitals/Pain Pain Assessment: Faces Faces Pain Scale: Hurts little more Pain Location: right thumb with mvmt Pain Descriptors / Indicators: Aching Pain Intervention(s): Monitored during session;Limited activity within patient's tolerance    Home Living Family/patient expects to be discharged to:: Private residence Living Arrangements: Spouse/significant other Available Help at Discharge: Family;Available 24 hours/day Type of Home: House Home Access: Ramped entrance     Home Layout: One level Home Equipment: Walker - 2 wheels;Bedside commode;Electric scooter Additional Comments: pt reports she could really use grab bars but they rent their home and cannot make modifications    Prior Function Level of Independence: Needs assistance   Gait / Transfers Assistance Needed: pt ambulates only minimally and reports that the cardiologist told her not to walk  ADL's / Homemaking Assistance Needed: husband cooks and cleans, helps pt bathe  Comments: pt uses electric scooter and transfers. Though husband reports that when her breathing is  better, she does ambulate around home     Hand Dominance        Extremity/Trunk Assessment   Upper Extremity Assessment: Generalized weakness           Lower Extremity Assessment: Generalized weakness      Cervical / Trunk Assessment: Kyphotic  Communication   Communication: No difficulties   Cognition Arousal/Alertness: Awake/alert Behavior During Therapy: Agitated Overall Cognitive Status: Within Functional Limits for tasks assessed                      General Comments General comments (skin integrity, edema, etc.): O2 sats 98% on 3L O2 and just received breathing treatment    Exercises        Assessment/Plan    PT Assessment Patient needs continued PT services  PT Diagnosis Difficulty walking;Generalized weakness   PT Problem List Decreased strength;Cardiopulmonary status limiting activity;Decreased knowledge of use of DME;Decreased knowledge of precautions;Decreased balance;Decreased mobility;Decreased activity tolerance  PT Treatment Interventions DME instruction;Gait training;Functional mobility training;Therapeutic activities;Therapeutic exercise;Balance training;Patient/family education   PT Goals (Current goals can be found in the Care Plan section) Acute Rehab PT Goals Patient Stated Goal: return home, breathe better PT Goal Formulation: With patient Time For Goal Achievement: 07/10/15 Potential to Achieve Goals: Good    Frequency Min 3X/week   Barriers to discharge        Co-evaluation               End of Session Equipment Utilized During Treatment: Oxygen Activity Tolerance: Patient tolerated treatment well Patient left: in chair;with call bell/phone within reach;with family/visitor present Nurse Communication: Mobility status         Time: [id number] PT Time Calculation (min) (ACUTE ONLY): 23 min   Charges:   PT Evaluation $PT Eval Moderate Complexity: 1 Procedure PT Treatments $Gait Training: 8-22 mins   PT G Codes:       Denise Freeman, PT  Acute Rehab Services  [place], [place] 06/26/2015, 2:11 PM

## 2015-06-26 NOTE — Discharge Summary (Signed)
AMA  Patient at this time expresses desire to leave the Hospital immidiately, patient has been warned that this is not Medically advisable at this time, and can result in Medical complications like Death and Disability, patient understands and accepts the risks involved and assumes full responsibilty of this decision.  Note husband bedside aware, patient alert awake oriented 3. Has pulled her IV and adamant on leaving.   Thurnell Lose M.D on 06/26/2015 at 3:27 PM  [facility]    Last Note Below                                                                                                                                                                                               Patient Demographics:    Denise Freeman, is a 65 y.o. female, DOB - 10/23/1950, [id number]  Admit date - 06/24/2015   Admitting Physician Norval Morton, MD  Outpatient Primary MD for the patient is London Pepper, MD  LOS - 2   Chief Complaint  Patient presents with  . Shortness of Breath        Subjective:    Denise Freeman today has, No headache, No chest pain, No abdominal pain - No Nausea, No new weakness tingling or numbness, No Cough - SOB.     Assessment  & Plan :     1. GI Bleed - most likely upper GI, she had melanotic stools with high BUN, was also on xaralto. Hold xaralto, IV PPI, transfused 3 units of packed RBCs on 06/25/2015, now stable H&H closely. GI following likely EGD this admission.  2. Acute blood loss with anemia. Transfused 3 units on 06/25/2015 Will monitor.  3. Chronic atrial fibrillation. [place] VASC 2 score of 5. Hold xaralto due to an acute bleed, continue diltiazem for rate control. Outpatient follow-up with [name] her cardiologist post discharge.  4. 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 Baseline creatinine appears to be close  to 1 - transfused and monitor. Much improved.  5. Chronic mild systolic heart failure last EF 45-50%. Compensated. Diuretics as the number for above.  6. History of COPD. Stable, no wheezing, nebulizer treatments and oxygen as needed.  7. Obstructive sleep apnea. Uses BiPAP at night continue.  8. Leukocytosis. likely reactionary, afebrile, chest x-ray UA stable. Monitor.  9. Dyslipidemia. Continue home dose statin.  10. DM type II. Neck A1c, continue sliding scale for now and monitor.  Lab Results  Component Value Date   HGBA1C 6.6* 06/25/2015    CBG (last 3)   Recent Labs  06/26/15 0826 06/26/15 1206 06/26/15 1234  GLUCAP 152* 159* 152*      Code Status : Full  Family Communication  : None  Disposition Plan  : TBD  Consults  :  GI  Procedures  :   DVT Prophylaxis  :  SCDs    Lab Results  Component Value Date   PLT 293 06/25/2015    Inpatient Medications  Scheduled Meds: . arformoterol  15 mcg Nebulization BID  . atorvastatin  20 mg Oral Daily  . budesonide  0.5 mg Nebulization BID  . diltiazem  30 mg Oral BID  . escitalopram  10 mg Oral QHS  . fenofibrate  54 mg Oral Daily  . furosemide  80 mg Oral BID  . insulin aspart  0-9 Units Subcutaneous Q4H  . ipratropium-albuterol  3 mL Nebulization TID  . [START ON 06/28/2015] metolazone  2.5 mg Oral Daily  . mirtazapine  15 mg Oral QHS  . modafinil  200 mg Oral Daily  . nystatin   Topical BID  . pantoprazole (PROTONIX) IV  40 mg Intravenous Q12H  . potassium chloride SA  20 mEq Oral TID  . primidone  50 mg Oral QHS   Continuous Infusions:  PRN Meds:.albuterol, diphenhydrAMINE, fluticasone, HYDROcodone-acetaminophen, nitroGLYCERIN, oxyCODONE  Antibiotics  :     Anti-infectives    Start     Dose/Rate Route Frequency Ordered Stop   06/24/15 2330  sulfamethoxazole-trimethoprim (BACTRIM,SEPTRA) 400-80 MG per tablet 1 tablet  Status:  Discontinued     1 tablet Oral 2 times daily 06/24/15 2323 06/25/15  0220        Objective:   Filed Vitals:   06/26/15 0900 06/26/15 1225 06/26/15 1229 06/26/15 1337  BP: 118/72 120/58    Pulse: 88 77    Temp:  98.7 F (37.1 C)    TempSrc:  Oral    Resp: 15 18    Height:      Weight:   93.169 kg (205 lb 6.4 oz)   SpO2: 98% 100%  99%    Wt Readings from Last 3 Encounters:  06/26/15 93.169 kg (205 lb 6.4 oz)  06/07/15 94.802 kg (209 lb)  04/21/15 96.525 kg (212 lb 12.8 oz)     Intake/Output Summary (Last 24 hours) at 06/26/15 1527 Last data filed at 06/26/15 1249  Gross per 24 hour  Intake    330 ml  Output   1375 ml  Net  -1045 ml     Physical Exam  Awake Alert, Oriented X 3, No new F.N deficits, Normal affect [place].AT,PERRAL Supple Neck,No JVD, No cervical lymphadenopathy appriciated.  Symmetrical Chest wall movement, Good air movement bilaterally, CTAB RRR,No Gallops,Rubs or new Murmurs, No Parasternal Heave +ve B.Sounds, Abd Soft, No tenderness, No organomegaly appriciated, No rebound - guarding or rigidity. No Cyanosis, Clubbing or edema, No new Rash or bruise       Data Review:   Micro Results Recent Results (from the past 240 hour(s))  MRSA PCR Screening     Status: None   Collection Time: 06/25/15  2:32 AM  Result Value Ref Range Status   MRSA by PCR NEGATIVE NEGATIVE Final    Comment:        The GeneXpert MRSA Assay (  FDA approved for NASAL specimens only), is one component of a comprehensive MRSA colonization surveillance program. It is not intended to diagnose MRSA infection nor to guide or monitor treatment for MRSA infections.     Radiology Reports Dg Chest 2 View  06/24/2015  CLINICAL DATA:  Shortness of breath, bilateral lower extremity swelling for 3 days EXAM: CHEST  2 VIEW COMPARISON:  03/30/2015 FINDINGS: Moderately severe cardiac enlargement. Mild to moderate vascular congestion. These findings are similar to the prior study although vascular congestion is mildly more prominent. There is no evidence of  pulmonary edema. No consolidation or effusion. IMPRESSION: Vascular congestion with cardiac enlargement but no evidence of pulmonary edema Electronically Signed   By: Skipper Cliche M.D.   On: 06/24/2015 17:48   Dg Chest Port 1 View  06/25/2015  CLINICAL DATA:  Shortness of breath. EXAM: PORTABLE CHEST 1 VIEW COMPARISON:  06/24/2015 FINDINGS: The cardiac silhouette is enlarged. Mediastinal contours appear intact. There is no evidence of focal airspace consolidation, pleural effusion or pneumothorax. There is pulmonary vascular congestion. Bilateral lower lobe peribronchovascular opacities likely represent areas of atelectasis. Osseous structures are without acute abnormality. Lower cervical spine fusion is noted. Soft tissues are grossly normal. IMPRESSION: Enlarged cardiac silhouette with pulmonary vascular congestion. Bilateral lower lobe subsegmental atelectasis. Electronically Signed   By: Fidela Salisbury M.D.   On: 06/25/2015 08:27     CBC  Recent Labs Lab 06/24/15 1751 06/25/15 1050 06/25/15 1851 06/25/15 2224 06/26/15 0540  WBC 17.4* 14.9*  --   --   --   HGB 5.6* 8.6* 8.9* 8.4* 8.3*  HCT 17.5* 25.6* 29.1* 25.1* 24.7*  PLT 347 293  --   --   --   MCV 86.6 87.1  --   --   --   MCH 27.7 29.3  --   --   --   MCHC 32.0 33.6  --   --   --   RDW 14.2 13.8  --   --   --     Chemistries   Recent Labs Lab 06/24/15 1751 06/25/15 0922 06/26/15 0912  NA 136 140 139  K 3.6 2.9* 3.6  CL 88* 93* 95*  CO2 34* 35* 31  GLUCOSE 164* 151* 163*  BUN 58* 48* 36*  CREATININE 1.78* 1.42* 1.30*  CALCIUM 8.7* 8.5* 8.6*  MG  --   --  2.2   ------------------------------------------------------------------------------------------------------------------ No results for input(s): CHOL, HDL, LDLCALC, TRIG, CHOLHDL, LDLDIRECT in the last 72 hours.  Lab Results  Component Value Date   HGBA1C 6.6* 06/25/2015    ------------------------------------------------------------------------------------------------------------------ No results for input(s): TSH, T4TOTAL, T3FREE, THYROIDAB in the last 72 hours.  Invalid input(s): FREET3 ------------------------------------------------------------------------------------------------------------------ No results for input(s): VITAMINB12, FOLATE, FERRITIN, TIBC, IRON, RETICCTPCT in the last 72 hours.  Coagulation profile  Recent Labs Lab 06/24/15 1751  INR 1.61*    No results for input(s): DDIMER in the last 72 hours.  Cardiac Enzymes No results for input(s): CKMB, TROPONINI, MYOGLOBIN in the last 168 hours.  Invalid input(s): CK ------------------------------------------------------------------------------------------------------------------    Component Value Date/Time   BNP 216.5* 06/24/2015 1751    Time Spent in minutes  35   Nikesh Teschner K M.D on 06/26/2015 at 3:27 PM  Between 7am to 7pm - Pager - [phone]  After 7pm go to [email or web address] - password [facility]  [facility] -  Office  [phone]

## 2015-06-27 LAB — TYPE AND SCREEN
ABO/RH(D): A POS
ANTIBODY SCREEN: NEGATIVE
UNIT DIVISION: 0
Unit division: 0
Unit division: 0
Unit division: 0

## 2015-06-28 DIAGNOSIS — Z79899 Other long term (current) drug therapy: Secondary | ICD-10-CM | POA: Diagnosis not present

## 2015-06-28 DIAGNOSIS — Z09 Encounter for follow-up examination after completed treatment for conditions other than malignant neoplasm: Secondary | ICD-10-CM | POA: Diagnosis not present

## 2015-06-28 DIAGNOSIS — Z794 Long term (current) use of insulin: Secondary | ICD-10-CM | POA: Diagnosis not present

## 2015-06-28 DIAGNOSIS — E119 Type 2 diabetes mellitus without complications: Secondary | ICD-10-CM | POA: Diagnosis not present

## 2015-06-28 DIAGNOSIS — D500 Iron deficiency anemia secondary to blood loss (chronic): Secondary | ICD-10-CM | POA: Diagnosis not present

## 2015-06-30 ENCOUNTER — Ambulatory Visit: Payer: Medicare Other | Admitting: Internal Medicine

## 2015-06-30 ENCOUNTER — Telehealth: Payer: Self-pay | Admitting: Internal Medicine

## 2015-06-30 DIAGNOSIS — K625 Hemorrhage of anus and rectum: Secondary | ICD-10-CM | POA: Diagnosis not present

## 2015-06-30 DIAGNOSIS — K219 Gastro-esophageal reflux disease without esophagitis: Secondary | ICD-10-CM | POA: Diagnosis not present

## 2015-06-30 DIAGNOSIS — D729 Disorder of white blood cells, unspecified: Secondary | ICD-10-CM | POA: Diagnosis not present

## 2015-06-30 DIAGNOSIS — D649 Anemia, unspecified: Secondary | ICD-10-CM | POA: Diagnosis not present

## 2015-06-30 NOTE — Telephone Encounter (Signed)
Pt c/o medication issue: 1. Name of Medication: Xarelto 2. How are you currently taking this medication (dosage and times per day)? 1 x a day 3. Are you having a reaction (difficulty breathing--STAT)?  no 4. What is your medication issue? Cause her to bleed and pt don't want to take it anymore.   Pt wish to speak to nurse.

## 2015-06-30 NOTE — Telephone Encounter (Signed)
Spoke with pt, she was seen by a GI doctor in [place] and she was told to call and let us know she can not take the [place]. She was seen by lenin peters md. # [phone]. Left message for dr peter's office to forward his office note to dr hilty.

## 2015-07-03 DIAGNOSIS — L98491 Non-pressure chronic ulcer of skin of other sites limited to breakdown of skin: Secondary | ICD-10-CM | POA: Diagnosis not present

## 2015-07-03 DIAGNOSIS — L821 Other seborrheic keratosis: Secondary | ICD-10-CM | POA: Diagnosis not present

## 2015-07-03 DIAGNOSIS — L814 Other melanin hyperpigmentation: Secondary | ICD-10-CM | POA: Diagnosis not present

## 2015-07-04 ENCOUNTER — Telehealth: Payer: Self-pay | Admitting: Nurse Practitioner

## 2015-07-04 NOTE — Telephone Encounter (Signed)
Noted  

## 2015-07-04 NOTE — Telephone Encounter (Signed)
Called the patient to tell her we would not be taking her on as a new patient and she said  "[place], thank you"

## 2015-07-05 DIAGNOSIS — Z794 Long term (current) use of insulin: Secondary | ICD-10-CM | POA: Diagnosis not present

## 2015-07-05 DIAGNOSIS — D500 Iron deficiency anemia secondary to blood loss (chronic): Secondary | ICD-10-CM | POA: Diagnosis not present

## 2015-07-05 DIAGNOSIS — E119 Type 2 diabetes mellitus without complications: Secondary | ICD-10-CM | POA: Diagnosis not present

## 2015-07-05 DIAGNOSIS — M179 Osteoarthritis of knee, unspecified: Secondary | ICD-10-CM | POA: Diagnosis not present

## 2015-07-06 DIAGNOSIS — M5416 Radiculopathy, lumbar region: Secondary | ICD-10-CM | POA: Diagnosis not present

## 2015-07-06 DIAGNOSIS — G890 Central pain syndrome: Secondary | ICD-10-CM | POA: Diagnosis not present

## 2015-07-06 DIAGNOSIS — M5432 Sciatica, left side: Secondary | ICD-10-CM | POA: Diagnosis not present

## 2015-07-06 DIAGNOSIS — G894 Chronic pain syndrome: Secondary | ICD-10-CM | POA: Diagnosis not present

## 2015-07-06 DIAGNOSIS — M5431 Sciatica, right side: Secondary | ICD-10-CM | POA: Diagnosis not present

## 2015-07-06 DIAGNOSIS — Z79891 Long term (current) use of opiate analgesic: Secondary | ICD-10-CM | POA: Diagnosis not present

## 2015-07-06 DIAGNOSIS — M5442 Lumbago with sciatica, left side: Secondary | ICD-10-CM | POA: Diagnosis not present

## 2015-07-10 DIAGNOSIS — E119 Type 2 diabetes mellitus without complications: Secondary | ICD-10-CM | POA: Diagnosis not present

## 2015-07-10 DIAGNOSIS — M179 Osteoarthritis of knee, unspecified: Secondary | ICD-10-CM | POA: Diagnosis not present

## 2015-07-10 DIAGNOSIS — M5441 Lumbago with sciatica, right side: Secondary | ICD-10-CM | POA: Diagnosis not present

## 2015-07-10 DIAGNOSIS — M5442 Lumbago with sciatica, left side: Secondary | ICD-10-CM | POA: Diagnosis not present

## 2015-07-12 DIAGNOSIS — G894 Chronic pain syndrome: Secondary | ICD-10-CM | POA: Diagnosis not present

## 2015-07-12 DIAGNOSIS — Z79891 Long term (current) use of opiate analgesic: Secondary | ICD-10-CM | POA: Diagnosis not present

## 2015-07-13 ENCOUNTER — Telehealth: Payer: Self-pay | Admitting: Pulmonary Disease

## 2015-07-13 NOTE — Telephone Encounter (Signed)
LMTCB x 1 

## 2015-07-14 ENCOUNTER — Telehealth: Payer: Self-pay | Admitting: Pulmonary Disease

## 2015-07-14 DIAGNOSIS — G4733 Obstructive sleep apnea (adult) (pediatric): Secondary | ICD-10-CM

## 2015-07-14 MED ORDER — IPRATROPIUM-ALBUTEROL 0.5-2.5 (3) MG/3ML IN SOLN: 3.0000 mL | Freq: Four times a day (QID) | RESPIRATORY_TRACT | Status: DC

## 2015-07-14 MED ORDER — MONTELUKAST SODIUM 10 MG PO TABS: 10.0000 mg | ORAL_TABLET | Freq: Every day | ORAL | Status: DC

## 2015-07-14 NOTE — Telephone Encounter (Signed)
Called [facility] back and received answering service. WCB

## 2015-07-14 NOTE — Telephone Encounter (Signed)
Called [facility] and received answering service. WCB

## 2015-07-14 NOTE — Telephone Encounter (Signed)
Called spoke with [name] from [facility]. She reports pt called into them stating someone from [facility] dropped her BIPAP and reports it has not worked right since. [facility] checked her BIPAP and everything works fine and pressure works just fine as well. Nothing wrong with her BIPAP machine. Pt kept complaining about her machine and wanted to know if she could change to a diff type of machine. [name] reports pt could try a trilogy if she has COPD and OSA.  Pt advised [name] she was not going to use her BIPAP any longer bc she can't breathe on it. Pt has pending appt 08/01/15 but advised [name] we needed to work her in sooner.  ATC pt--line rang numerous times, NA and no VM came up. WCB

## 2015-07-14 NOTE — Telephone Encounter (Signed)
Pt returning call.Denise Freeman ° °

## 2015-07-14 NOTE — Telephone Encounter (Signed)
Spoke with pt. Both rx's that she was needing have been sent in. Nothing further was needed.

## 2015-07-17 NOTE — Telephone Encounter (Signed)
Spoke with patient, tried to get her to come in on 2/15 at 3:15 opening, but patient has prior engagement with [facility] that day and cannot come.  Patient wants to know if Dr. Halford Chessman can see her Thursday 07/20/15 or Friday 07/21/15 instead.  Patient wants to get new equipment as soon as possible. Can contact patient at [phone]  Dr. Halford Chessman, is it okay to doublebook you on Thursday or Friday? Please advise.

## 2015-07-17 NOTE — Telephone Encounter (Signed)
Can you have them send a copy of her most recent BiPAP download >> will see if her settings can be adjusted to get her more comfortable with her current machine.

## 2015-07-17 NOTE — Telephone Encounter (Signed)
I have sent an order to pt dme to request download.  Called pt, NA, and no VM. WCB

## 2015-07-18 ENCOUNTER — Telehealth: Payer: Self-pay | Admitting: Internal Medicine

## 2015-07-18 MED ORDER — FENOFIBRATE 54 MG PO TABS: 54.0000 mg | ORAL_TABLET | Freq: Every day | ORAL | Status: DC

## 2015-07-18 NOTE — Telephone Encounter (Signed)
Pt needs her [facility]

## 2015-07-18 NOTE — Telephone Encounter (Signed)
Refill sent to [facility].  

## 2015-07-18 NOTE — Telephone Encounter (Signed)
Spoke with patient-states that she has spoken with her DME and they are going to be coming out to get a download today from her machine. Pt aware that we will call her once Dr Halford Chessman reviews this. Will hold in my box until download is received.

## 2015-07-20 NOTE — Telephone Encounter (Signed)
Called spoke with [facility]. They are having to go back out today as they could not get download yesterday. [facility] will call once this is done and if download is being faxed. Will await call

## 2015-07-21 ENCOUNTER — Telehealth: Payer: Self-pay | Admitting: Pulmonary Disease

## 2015-07-21 DIAGNOSIS — D539 Nutritional anemia, unspecified: Secondary | ICD-10-CM | POA: Diagnosis not present

## 2015-07-21 DIAGNOSIS — M179 Osteoarthritis of knee, unspecified: Secondary | ICD-10-CM | POA: Diagnosis not present

## 2015-07-21 DIAGNOSIS — M5441 Lumbago with sciatica, right side: Secondary | ICD-10-CM | POA: Diagnosis not present

## 2015-07-21 DIAGNOSIS — M129 Arthropathy, unspecified: Secondary | ICD-10-CM | POA: Diagnosis not present

## 2015-07-21 DIAGNOSIS — M5442 Lumbago with sciatica, left side: Secondary | ICD-10-CM | POA: Diagnosis not present

## 2015-07-21 DIAGNOSIS — D649 Anemia, unspecified: Secondary | ICD-10-CM | POA: Diagnosis not present

## 2015-07-21 DIAGNOSIS — G8929 Other chronic pain: Secondary | ICD-10-CM | POA: Diagnosis not present

## 2015-07-21 NOTE — Telephone Encounter (Signed)
Spoke with rep at [facility]-wanted to know if they could change sig for Duoneb to QID prn-according to past Rx's for patient prn was not an option and therefore [facility] will not change Rx to state QID prn. Nothing more needed at this time.

## 2015-07-21 NOTE — Telephone Encounter (Signed)
Spoke with Jenny Reichmann, Pharmacist at [facility] to verify pt's duoneb rx.  This has been done, all questions have been answered.  Nothing further needed.

## 2015-07-21 NOTE — Telephone Encounter (Signed)
No download received yet.  Will await fax as [facility] just went out yesterday.

## 2015-07-24 NOTE — Telephone Encounter (Signed)
ATC [facility] numerous times and line rings busy. WCB

## 2015-07-25 NOTE — Telephone Encounter (Signed)
Christine returned call. She did fax it over to [phone].

## 2015-07-25 NOTE — Telephone Encounter (Signed)
No download received yet on pt.  Called [facility] for download-was told that they were having some difficulty getting the raw data transferred to a report.  States that they would work on this and get it faxed to our office as they have it available.  Will await fax.

## 2015-07-25 NOTE — Telephone Encounter (Signed)
Called [facility] - download not received.  LM for Christine to re-fax this.  Will await call back or fax

## 2015-07-26 NOTE — Telephone Encounter (Signed)
Received Download, placed in Dr Juanetta Gosling look at to be reviewed.  Please advise. Thanks.

## 2015-07-28 DIAGNOSIS — E119 Type 2 diabetes mellitus without complications: Secondary | ICD-10-CM | POA: Diagnosis not present

## 2015-07-28 DIAGNOSIS — Z79899 Other long term (current) drug therapy: Secondary | ICD-10-CM | POA: Diagnosis not present

## 2015-07-28 DIAGNOSIS — M5441 Lumbago with sciatica, right side: Secondary | ICD-10-CM | POA: Diagnosis not present

## 2015-07-28 DIAGNOSIS — M5442 Lumbago with sciatica, left side: Secondary | ICD-10-CM | POA: Diagnosis not present

## 2015-07-28 DIAGNOSIS — G8929 Other chronic pain: Secondary | ICD-10-CM | POA: Diagnosis not present

## 2015-07-31 DIAGNOSIS — Z794 Long term (current) use of insulin: Secondary | ICD-10-CM | POA: Diagnosis not present

## 2015-07-31 DIAGNOSIS — E119 Type 2 diabetes mellitus without complications: Secondary | ICD-10-CM | POA: Diagnosis not present

## 2015-07-31 DIAGNOSIS — R1084 Generalized abdominal pain: Secondary | ICD-10-CM | POA: Diagnosis not present

## 2015-07-31 DIAGNOSIS — J209 Acute bronchitis, unspecified: Secondary | ICD-10-CM | POA: Diagnosis not present

## 2015-07-31 NOTE — Telephone Encounter (Signed)
She is scheduled for appt with me on 08/01/15.  Please advise that I will address at that visit.

## 2015-07-31 NOTE — Telephone Encounter (Signed)
Awaiting Dr. Juanetta Gosling review.

## 2015-07-31 NOTE — Telephone Encounter (Signed)
BiPAP download 07/19/15 >> used 9 hrs 49 minutes.  AHI 2.2 with BiPAP 16/10 cm H2O.  Please inform Denise Freeman that her BiPAP report shows good control of her sleep apnea on current settings of BiPAP 16/10 cm H2O.  Based on this, she does not need to have adjustment to her set up.

## 2015-07-31 NOTE — Telephone Encounter (Signed)
Spoke with pt. She is aware that her and VS will discuss this tomorrow when she comes in.

## 2015-07-31 NOTE — Telephone Encounter (Signed)
Spoke with pt. She is aware of download results. Pt wants an order for a new machine. States that her current machine is not working right.  VS - please advise. Thanks.

## 2015-08-01 ENCOUNTER — Ambulatory Visit (INDEPENDENT_AMBULATORY_CARE_PROVIDER_SITE_OTHER): Payer: Medicare Other | Admitting: Pulmonary Disease

## 2015-08-01 ENCOUNTER — Encounter: Payer: Self-pay | Admitting: Pulmonary Disease

## 2015-08-01 VITALS — BP 136/74 | HR 87 | Ht 59.0 in | Wt 210.6 lb

## 2015-08-01 DIAGNOSIS — J42 Unspecified chronic bronchitis: Secondary | ICD-10-CM

## 2015-08-01 DIAGNOSIS — J9611 Chronic respiratory failure with hypoxia: Secondary | ICD-10-CM | POA: Diagnosis not present

## 2015-08-01 DIAGNOSIS — J449 Chronic obstructive pulmonary disease, unspecified: Secondary | ICD-10-CM

## 2015-08-01 DIAGNOSIS — G4733 Obstructive sleep apnea (adult) (pediatric): Secondary | ICD-10-CM

## 2015-08-01 NOTE — Progress Notes (Signed)
Current Outpatient Prescriptions on File Prior to Visit  Medication Sig  . albuterol (PROVENTIL) (2.5 MG/3ML) 0.083% nebulizer solution Take 3 mLs (2.5 mg total) by nebulization every 6 (six) hours as needed for wheezing or shortness of breath.  Marland Kitchen atorvastatin (LIPITOR) 20 MG tablet Take 1 tablet (20 mg total) by mouth daily.  . budesonide (PULMICORT) 0.5 MG/2ML nebulizer solution Take 2 mLs (0.5 mg total) by nebulization 2 (two) times daily. DX J43.8  . cetirizine (ZYRTEC) 10 MG tablet Take 10 mg by mouth daily.  Marland Kitchen diltiazem (CARDIZEM) 30 MG tablet Take 1 tablet (30 mg total) by mouth 2 (two) times daily.  . diphenhydrAMINE (BENADRYL) 25 mg capsule Take 25 mg by mouth 2 (two) times daily.   Marland Kitchen docusate sodium (COLACE) 100 MG capsule Take 100 mg by mouth daily.  Marland Kitchen escitalopram (LEXAPRO) 10 MG tablet Take 10 mg by mouth at bedtime.   . famotidine (PEPCID) 20 MG tablet Take 20 mg by mouth 2 (two) times daily.  . fenofibrate 54 MG tablet Take 1 tablet (54 mg total) by mouth daily.  . fluticasone (FLONASE) 50 MCG/ACT nasal spray Place 1 spray into both nostrils daily as needed for allergies or rhinitis.   . furosemide (LASIX) 40 MG tablet Take 1.5 tablets (60 mg total) by mouth 2 (two) times daily. (Patient taking differently: Take 80 mg by mouth 2 (two) times daily. )  . Insulin Glargine (TOUJEO SOLOSTAR) 300 UNIT/ML SOPN Inject 80 Units into the skin at bedtime.  . insulin lispro (HUMALOG KWIKPEN) 100 UNIT/ML KiwkPen INJECT 20 UNITS INTO TWICE DAILY AND 28 UNITS IN THE EVENING. (Patient taking differently: Inject 22-28 Units into the skin 3 (three) times daily with meals. Inject 22 units subcutaneously daily with breakfast, 24 units with lunch, 28 units with supper)  . Insulin Pen Needle 32G X 4 MM MISC Use to inject insulin 4 times daily.  Marland Kitchen ipratropium-albuterol (DUONEB) 0.5-2.5 (3) MG/3ML SOLN Take 3 mLs by nebulization 4 (four) times daily.  . metolazone (ZAROXOLYN) 2.5 MG tablet Take 1 tablet  (2.5 mg total) by mouth daily.  . mirtazapine (REMERON) 15 MG tablet Take 15 mg by mouth at bedtime.  . modafinil (PROVIGIL) 200 MG tablet Take 1 tablet (200 mg total) by mouth daily.  . montelukast (SINGULAIR) 10 MG tablet Take 1 tablet (10 mg total) by mouth at bedtime.  . nitroGLYCERIN (NITROSTAT) 0.4 MG SL tablet Place 1 tablet (0.4 mg total) under the tongue every 5 (five) minutes as needed for chest pain.  Marland Kitchen nystatin (MYCOSTATIN/NYSTOP) 100000 UNIT/GM POWD Apply topically 2 (two) times daily. Apply under breasts  . ondansetron (ZOFRAN-ODT) 4 MG disintegrating tablet Take 4 mg by mouth every 8 (eight) hours as needed for nausea or vomiting.  Marland Kitchen oxyCODONE (OXY IR/ROXICODONE) 5 MG immediate release tablet Take 5 mg by mouth 2 (two) times daily.  . OXYGEN Inhale 3 L into the lungs continuous. 3 liters 24/7  . pantoprazole (PROTONIX) 40 MG tablet Take 1 tablet (40 mg total) by mouth daily.  . potassium chloride SA (K-DUR,KLOR-CON) 20 MEQ tablet Take 1 tablet (20 mEq total) by mouth 3 (three) times daily.  Marland Kitchen PRESCRIPTION MEDICATION Inhale into the lungs See admin instructions. Use BIPAP every time laying down  . primidone (MYSOLINE) 50 MG tablet Take 1 tablet (50 mg total) by mouth at bedtime.  . Probiotic Product (PROBIOTIC PO) Take 1 tablet by mouth at bedtime.  . sulfamethoxazole-trimethoprim (BACTRIM) 400-80 MG tablet Take 1 tablet by mouth  2 (two) times daily.   No current facility-administered medications on file prior to visit.    Chief Complaint  Patient presents with  . Follow-up    Wears BiPAP nightly - pt states that her machine was dropped while in the hospital and needs to be checked/replaced. DME: [facility]. Reports cough x 4 days with pain in left chest below breast and left shoulder, received 2 injections (abx and steroid) 07/31/15.     Tests PSG 1991 Echo 05/30/14 >> EF 45 to 50% PFT 10/24/14 >> FEV1 0.64 (30%), FEV1% 65, TLC 2.92 (64%), DLCO 54%, + BD  Past medical history DM,  CAD, A fib, Systolic CHF, Bipolar, Fibromyalgia, HH, GERD  Past surgical hx, Medications, Allergies, Family hx, Social hx all reviewed.  Vital signs BP 136/74 mmHg  Pulse 87  Ht 4\' 11"  (1.499 m)  Wt 210 lb 9.6 oz (95.528 kg)  BMI 42.51 kg/m2  SpO2 99%  History of Present Illness: Denise Freeman is a 64 y.o. female former smoker with COPD, OSA, and chronic hypoxic/hypercapnic respiratory failure with OHS.  She is convinced her BiPAP was broken when it was dropped while she was in hospital.  Her machine is more than 65 years old.  She was told by her DME that she should get a Trilogy home vent.  She has intermittent cough, but not much sputum.  She doesn't understand why she has so much trouble doing activities >> she is usually not very active.  She felt sweaty on arrival to office today, and thought her blood sugar was low >> felt better after eating some crackers.  She has noticed intermittent episodes of chest discomfort >> this feels superficial and worse when she moves in certain positions.  She uses 3 liters oxygen 24/7.   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 Plan: - continue budesonide, ipratropium, albuterol via nebulizer - continue singulair - will refer for pulmonary rehab  Obstructive sleep apnea. Plan: - continue BiPAP 16/10 cm H2O qhs >> will arrange for new BiPAP device >> if insurance denies BiPAP set up as RAD, then will need to consider arranging for home ventilator instead  Chronic respiratory failure 2nd to COPD and Obesity hypoventilation syndrome. Plan: - continue 3 liters oxygen  [id number]  Chronic systolic heart failure. Plan: - f/u with cardiology  Goals of Care. Plan: - DNR/DNI   Patient Instructions  Will arrange for new BiPAP machine  Will arrange for referral to pulmonary rehab  Follow up in 6 months    Chesley Mires, MD [place] Pager:  [phone]

## 2015-08-01 NOTE — Patient Instructions (Signed)
Will arrange for new BiPAP machine  Will arrange for referral to pulmonary rehab  Follow up in 6 months

## 2015-08-07 DIAGNOSIS — R1084 Generalized abdominal pain: Secondary | ICD-10-CM | POA: Diagnosis not present

## 2015-08-07 DIAGNOSIS — M109 Gout, unspecified: Secondary | ICD-10-CM | POA: Diagnosis not present

## 2015-08-15 ENCOUNTER — Ambulatory Visit: Payer: Medicare Other | Admitting: Internal Medicine

## 2015-08-24 DIAGNOSIS — M5431 Sciatica, right side: Secondary | ICD-10-CM | POA: Diagnosis not present

## 2015-08-24 DIAGNOSIS — G894 Chronic pain syndrome: Secondary | ICD-10-CM | POA: Diagnosis not present

## 2015-08-24 DIAGNOSIS — M5432 Sciatica, left side: Secondary | ICD-10-CM | POA: Diagnosis not present

## 2015-08-24 DIAGNOSIS — Z79891 Long term (current) use of opiate analgesic: Secondary | ICD-10-CM | POA: Diagnosis not present

## 2015-08-24 DIAGNOSIS — M5416 Radiculopathy, lumbar region: Secondary | ICD-10-CM | POA: Diagnosis not present

## 2015-08-24 DIAGNOSIS — M5442 Lumbago with sciatica, left side: Secondary | ICD-10-CM | POA: Diagnosis not present

## 2015-08-24 DIAGNOSIS — G890 Central pain syndrome: Secondary | ICD-10-CM | POA: Diagnosis not present

## 2015-08-28 ENCOUNTER — Encounter: Payer: Self-pay | Admitting: Internal Medicine

## 2015-08-28 ENCOUNTER — Ambulatory Visit (INDEPENDENT_AMBULATORY_CARE_PROVIDER_SITE_OTHER): Payer: Medicare Other | Admitting: Internal Medicine

## 2015-08-28 VITALS — BP 114/68 | HR 92 | Temp 98.3°F | Resp 12 | Wt 208.0 lb

## 2015-08-28 DIAGNOSIS — E11319 Type 2 diabetes mellitus with unspecified diabetic retinopathy without macular edema: Secondary | ICD-10-CM

## 2015-08-28 DIAGNOSIS — E1159 Type 2 diabetes mellitus with other circulatory complications: Secondary | ICD-10-CM | POA: Insufficient documentation

## 2015-08-28 MED ORDER — INSULIN PEN NEEDLE 32G X 4 MM MISC: Status: DC

## 2015-08-28 MED ORDER — INSULIN LISPRO 100 UNIT/ML (KWIKPEN): PEN_INJECTOR | SUBCUTANEOUS | Status: DC

## 2015-08-28 NOTE — Patient Instructions (Addendum)
Please continue: - Toujeo 80 units at bedtime - Humalog: 25 units before B 27 units before L  32 units before D - sliding scale of Humalog - 150-175: + 1 unit  - 176-200: + 2 units  - 201-225: + 3 units  - 226-250: + 4 units  - 251-275: + 5 units - >275: + 6 units

## 2015-08-28 NOTE — Progress Notes (Signed)
Patient ID: Denise Freeman, female   DOB: [birth year], 65 y.o.   MRN: [id number]  HPI: Denise Freeman is a 65 y.o.-year-old female, initially referred by her PCP, Dr. Loman Chroman, for management of DM2, dx in 2009, insulin-dependent in 2013, uncontrolled, with complications (CKD, DR). Last visit 7 mo ago.   Since last visit, she had a R thumb infection. She was also in the hospital with GIB in 06/2015.  Last hemoglobin A1c was: Lab Results  Component Value Date   HGBA1C 6.6* 06/25/2015   HGBA1C 11.7 01/26/2015   HGBA1C 8.3* 09/13/2014   12/30/2013: HbA1c 8.6% 12/28/2013: HbA1c 7.9% HbA1c 8.7% HbA1c 9.4% HbA1c 7.3%  Pt is on a regimen of: - Toujeo 80 units at bedtime - Humalog: 25 units before B 27 units before L  32 units before D - sliding scale of Humalog - 150-175: + 1 unit  - 176-200: + 2 units  - 201-225: + 3 units  - 226-250: + 4 units  - 251-275: + 5 units - >275: + 6 units  - Trulicity [id number] mg under skin weekly.  We stopped Januvia >> did not help She was on Metformin >> nausea.  Pt checks her sugars 4x a day and they are better: - am: 149-170 >> 158-191 >> 160-175, 180 >> 173-189 >> 160s >> 149-157 >> 127-160 - 2h after b'fast: n/c - before lunch: 158-199 >> 170-199 >> 130-150 >> 167-191 >> 195-210 >> 160-183 >> 134-170 - 2h after lunch: n/c - before dinner: 168-200 >> 175-190  >> 179-200 >> 140s (if no snack), 165-170 >> 175-187 >> 140-188 - 2h after dinner: n/c - bedtime: 162-200, last week 210-235 >> 165-190 >> 169-238 >> 225-250 >> 180-209, 225 >> 163-188, 230 - nighttime:n/c No lows. Lowest sugar was 130 >> 167 >> 140s >> 127; she has hypoglycemia awareness at 70 at 120. Highest sugar was 240 >> 190 >> 200s >> 500 x1, otw 250s >> 230   Glucometer: FreeStyle  Pt's meals are: - Breakfast: toast with spread or oatmeal with splenda - Lunch: cottage cheese, or fruit - Dinner: chicken/beef/turkey with vegetables and a starch; macaroni and cheese - Snacks: 2  snacks   She tells me she eats between meals - almost incessantly.  - + CKD stage 3, last BUN/creatinine:  Lab Results  Component Value Date   BUN 36* 06/26/2015   CREATININE 1.30* 06/26/2015  On Lisinopril, fenofibrate. - last set of lipids: No results found for: CHOL, HDL, LDLCALC, LDLDIRECT, TRIG, CHOLHDL 09/07/2013: 180/135/31/138 On Crestor. - last eye exam was in 10/2014. + DR OU. She had cataract sx in 05 and 11/2014. - no numbness and tingling in her feet.  ROS: Constitutional: no weight loss, + fatigue, + poor sleep, + nocturia Eyes: no blurry vision, no xerophthalmia ENT: no sore throat, no nodules palpated in throat, no dysphagia/odynophagia, no hoarseness, + hypoacusis Cardiovascular: + CP/+ SOB/+ palpitations/+ leg swelling Respiratory: no cough/+ SOB/+ wheezing Gastrointestinal: + N/no V/+ D/no C, + heartburn Musculoskeletal: + muscle aches/+ joint aches Skin: no rashes, + easy bruising, + itching Neurological: no tremors/numbness/tingling/dizziness, + HA  I reviewed pt's medications, allergies, PMH, social hx, family hx, and changes were documented in the history of present illness. Otherwise, unchanged from my initial visit note:  Past Medical History  Diagnosis Date  . Diabetes mellitus without complication ([place])   . Bipolar 1 disorder ([place])   . Fibromyalgia   . COPD (chronic obstructive pulmonary disease) ([place])   . CHF (  congestive heart failure) ([place])   . Asthma   . Atrial fibrillation ([place])   . Neuropathy ([facility])     Disc Back   . Fibromyalgia   . CHF (congestive heart failure) ([place])   . Coronary artery disease   . Anemia   . History of hiatal hernia   . GERD (gastroesophageal reflux disease)   . Shortness of breath dyspnea   . Family history of adverse reaction to anesthesia     Uncle was positive for malignant hyerthermia; patient had testing done and was negative.  . Myocardial infarction ([place]) 1990  . Sleep apnea     uses biPAP, 10  .  Arthritis   . HOH (hard of hearing)    Past Surgical History  Procedure Laterality Date  . Heel spur surgery Bilateral   . Appendectomy    . Bladder suspension      2003, 2006 and 2010  . Neck surgery N/A 2009    4, 6, and 7 cervical disc replaced  . Bladder stimulator      pt states, "it cannot be turned off".  . Hernia repair      umbilical  . Cataract extraction w/phaco Right 11/29/2014    Procedure: CATARACT EXTRACTION PHACO AND INTRAOCULAR LENS PLACEMENT (IOC);  Surgeon: Rutherford Guys, MD;  Location: [facility];  Service: Ophthalmology;  Laterality: Right;  CDE:3.81  . Cataract extraction w/phaco Left 12/13/2014    Procedure: CATARACT EXTRACTION PHACO AND INTRAOCULAR LENS PLACEMENT (IOC);  Surgeon: Rutherford Guys, MD;  Location: [facility];  Service: Ophthalmology;  Laterality: Left;  CDE:6.59  . I&d extremity Right 06/13/2015    Procedure: MINOR IRRIGATION AND DEBRIDEMENT EXTREMITY REMOVAL OF NAIL;  Surgeon: Daryll Brod, MD;  Location: [facility];  Service: Orthopedics;  Laterality: Right;   History   Social History  . Marital Status: Married    Spouse Name: N/A    Number of Children: 1   Occupational History  . disabled   Social History Main Topics  . Smoking status: Former Smoker -- 2.00 packs/day for 14 years    Types: Cigarettes    Quit date: 04/12/1986  . Smokeless tobacco: Not on file  . Alcohol Use: No  . Drug Use: No   Current Outpatient Prescriptions on File Prior to Visit  Medication Sig Dispense Refill  . albuterol (PROVENTIL) (2.5 MG/3ML) 0.083% nebulizer solution Take 3 mLs (2.5 mg total) by nebulization every 6 (six) hours as needed for wheezing or shortness of breath. 1080 mL 0  . atorvastatin (LIPITOR) 20 MG tablet Take 1 tablet (20 mg total) by mouth daily. 30 tablet 1  . budesonide (PULMICORT) 0.5 MG/2ML nebulizer solution Take 2 mLs (0.5 mg total) by nebulization 2 (two) times daily. DX J43.8 360 mL 0  . cetirizine (ZYRTEC) 10 MG tablet Take 10 mg  by mouth daily.    Marland Kitchen diltiazem (CARDIZEM) 30 MG tablet Take 1 tablet (30 mg total) by mouth 2 (two) times daily. 180 tablet 3  . diphenhydrAMINE (BENADRYL) 25 mg capsule Take 25 mg by mouth 2 (two) times daily.     Marland Kitchen docusate sodium (COLACE) 100 MG capsule Take 100 mg by mouth daily.    Marland Kitchen escitalopram (LEXAPRO) 10 MG tablet Take 10 mg by mouth at bedtime.     . famotidine (PEPCID) 20 MG tablet Take 20 mg by mouth 2 (two) times daily.    . fenofibrate 54 MG tablet Take 1 tablet (54 mg total) by mouth daily. [place]  tablet 1  . fluticasone (FLONASE) 50 MCG/ACT nasal spray Place 1 spray into both nostrils daily as needed for allergies or rhinitis.     . furosemide (LASIX) 40 MG tablet Take 1.5 tablets (60 mg total) by mouth 2 (two) times daily. (Patient taking differently: Take 80 mg by mouth 2 (two) times daily. ) 270 tablet 3  . Insulin Glargine (TOUJEO SOLOSTAR) 300 UNIT/ML SOPN Inject 80 Units into the skin at bedtime. 18 pen 0  . insulin lispro (HUMALOG KWIKPEN) 100 UNIT/ML KiwkPen INJECT 20 UNITS INTO TWICE DAILY AND 28 UNITS IN THE EVENING. (Patient taking differently: Inject 22-28 Units into the skin 3 (three) times daily with meals. Inject 22 units subcutaneously daily with breakfast, 24 units with lunch, 28 units with supper) 20 pen 0  . Insulin Pen Needle 32G X 4 MM MISC Use to inject insulin 4 times daily. 400 each 1  . ipratropium-albuterol (DUONEB) 0.5-2.5 (3) MG/3ML SOLN Take 3 mLs by nebulization 4 (four) times daily. 1080 mL 1  . metolazone (ZAROXOLYN) 2.5 MG tablet Take 1 tablet (2.5 mg total) by mouth daily. 90 tablet 3  . mirtazapine (REMERON) 15 MG tablet Take 15 mg by mouth at bedtime.    . modafinil (PROVIGIL) 200 MG tablet Take 1 tablet (200 mg total) by mouth daily. 90 tablet 0  . montelukast (SINGULAIR) 10 MG tablet Take 1 tablet (10 mg total) by mouth at bedtime. 90 tablet 1  . nitroGLYCERIN (NITROSTAT) 0.4 MG SL tablet Place 1 tablet (0.4 mg total) under the tongue every 5 (five)  minutes as needed for chest pain. 25 tablet 3  . nystatin (MYCOSTATIN/NYSTOP) 100000 UNIT/GM POWD Apply topically 2 (two) times daily. Apply under breasts    . ondansetron (ZOFRAN-ODT) 4 MG disintegrating tablet Take 4 mg by mouth every 8 (eight) hours as needed for nausea or vomiting.    Marland Kitchen oxyCODONE (OXY IR/ROXICODONE) 5 MG immediate release tablet Take 5 mg by mouth 2 (two) times daily.    . OXYGEN Inhale 3 L into the lungs continuous. 3 liters 24/7    . potassium chloride SA (K-DUR,KLOR-CON) 20 MEQ tablet Take 1 tablet (20 mEq total) by mouth 3 (three) times daily. 270 tablet 3  . PRESCRIPTION MEDICATION Inhale into the lungs See admin instructions. Use BIPAP every time laying down    . primidone (MYSOLINE) 50 MG tablet Take 1 tablet (50 mg total) by mouth at bedtime. 90 tablet 1  . Probiotic Product (PROBIOTIC PO) Take 1 tablet by mouth at bedtime.     No current facility-administered medications on file prior to visit.   Allergies  Allergen Reactions  . Ancef [Cefazolin] Nausea And Vomiting  . Augmentin [Amoxicillin-Pot Clavulanate] Itching  . Ciprofloxacin Itching  . Haldol [Haloperidol] Other (See Comments)    Restless leg  . Levaquin [Levofloxacin In D5w] Other (See Comments)    "afib"  . Lyrica [Pregabalin] Hives  . Nsaids Diarrhea  . Tamiflu [Oseltamivir Phosphate] Other (See Comments)    "water blisters"  . Topiramate Nausea Only  . Zoloft [Sertraline Hcl] Other (See Comments)    Jaw problems, jittery  . Penicillins Itching, Nausea And Vomiting and Rash    Has patient had a PCN reaction causing immediate rash, facial/tongue/throat swelling, SOB or lightheadedness with hypotension: Yes Has patient had a PCN reaction causing severe rash involving mucus membranes or skin necrosis: No Has patient had a PCN reaction that required hospitalization No Has patient had a PCN reaction occurring within the  last 10 years: Yes If all of the above answers are "NO", then may proceed with  Cephalosporin use.   Family History  Problem Relation Age of Onset  . Heart disease Mother   . COPD Mother   . Cancer Mother     Breast cancer  . Diabetes Mother   . Heart disease Father   . COPD Sister   . Heart disease Sister   . Diabetes Sister   . Heart disease Maternal Grandmother    PE: BP 114/68 mmHg  Pulse 92  Temp(Src) 98.3 F (36.8 C) (Oral)  Resp 12  Wt 208 lb (94.348 kg)  SpO2 97% Body mass index is 41.99 kg/(m^2). Wt Readings from Last 3 Encounters:  08/28/15 208 lb (94.348 kg)  08/01/15 210 lb 9.6 oz (95.528 kg)  06/26/15 205 lb 6.4 oz (93.169 kg)   Constitutional: obese, in wheelchair, in NAD, on oxygen Eyes: PERRLA, EOMI, no exophthalmos ENT: moist mucous membranes, no thyromegaly, no cervical lymphadenopathy Cardiovascular: irreg. Irreg. , No MRG Respiratory: CTA B Gastrointestinal: abdomen soft, NT, ND, BS+ Musculoskeletal: no deformities, strength intact in all 4 Skin: moist, warm Neurological: + tremor with outstretched hands, DTR normal in all 4  ASSESSMENT: 1. DM2, insulin-dependent, uncontrolled, with complications - CKD stage 3 - DR  PLAN:  1. Patient with long-standing, uncontrolled diabetes (now with tremendously improved HbA1c 11.7% >> 6.6%!), on basal-bolus insulin regimen. Will continue current regimen. - I advised her to:  Patient Instructions  Please continue: - Toujeo 80 units at bedtime - Humalog: 25 units before B 27 units before L  32 units before D - sliding scale of Humalog - 150-175: + 1 unit  - 176-200: + 2 units  - 201-225: + 3 units  - 226-250: + 4 units  - 251-275: + 5 units - >275: + 6 units   Please return in 3 months with your sugar log.   - Strongly advised her to continue checking sugars at different times of the day - check 4 times a day, rotating checks  - advised for yearly eye exams >> UTD - Return to clinic in 3 mo with sugar log

## 2015-08-29 DIAGNOSIS — F329 Major depressive disorder, single episode, unspecified: Secondary | ICD-10-CM | POA: Diagnosis not present

## 2015-09-11 ENCOUNTER — Other Ambulatory Visit: Payer: Self-pay

## 2015-09-11 ENCOUNTER — Telehealth: Payer: Self-pay | Admitting: Internal Medicine

## 2015-09-11 DIAGNOSIS — Z1231 Encounter for screening mammogram for malignant neoplasm of breast: Secondary | ICD-10-CM

## 2015-09-11 NOTE — Telephone Encounter (Signed)
Spoke w/ patient, she relates history of past few months extensively but is poor in her chronology. Attempted to piece together narrative based on documented hospital admissions and office visits. Pt spoke for several minutes about an encounter where she was at [facility] to get an infected finger treated, and had left w/o completing treatment. She also recounts today several complaints about GI issues. She had a GI bleed for which she was hospitalized. States she is still off Xarelto, but had recurrence of bleeding recently. She is aware she needs to see gastro for this, but she is unsure who is following her care for this.  Unable to give me names of providers she has seen. Note that she was supposed to be set up to see [employer].  Dr. Cruzita Lederer saw pt most recently. Routed for clarification.

## 2015-09-11 NOTE — Telephone Encounter (Signed)
New message  Pt c/o of Chest Pain: 1. Are you having CP right now? No  2. Are you experiencing any other symptoms (ex. SOB, nausea, vomiting, sweating)? Have SOB all the time because of emphazema 3. How long have you been experiencing CP? 4 days  4. Is your CP continuous or coming and going? Come and go but she is always in Afib  5. Have you taken Nitroglycerin? Yes    Comments: Pt states that she has had a fam provider. Stopped going to him because he was always overbooked. Trying to find a family provider. Went to urgent care because she has gout. She reports that they took X-rays off her knee and then she found that there was a lump under her left breast. This has caused her to have to use the Nitro pills more. When she went to Urgent care, Xray's were taken per pt of her abdomen and found that she has an enlarged heart and she is wondering if this is when the blood is found. She also states she didn't start the bleeding until she started taking the Xarelto. She says it comes from her rectum but she doesn't know where its coming from within her body. She says she can only get up to do one thing and then that's it. She becomes fatigued. The urgent care provider sent her to see a gasterologist who didn't do anything but place her on a pill that had numerous side efects and the warnig said if he had heart related pain to not take.   Pain in her lower stomach and overies. Had a pap and they found nothing there either. She says everyone is playing the guessing game and that she simply needs someone to help and tell her the truth.  Pt request a call back to discuss

## 2015-09-12 ENCOUNTER — Other Ambulatory Visit: Payer: Self-pay | Admitting: Pulmonary Disease

## 2015-09-13 NOTE — Telephone Encounter (Signed)
I have requested pt call primary care for GI referral but will see if [name] will order.

## 2015-09-13 NOTE — Telephone Encounter (Signed)
Follow up    Patient calling back check on the status of referral

## 2015-09-14 NOTE — Telephone Encounter (Signed)
[facility] did not take her on as a new patient. She was seen by a GI in [place] - that may be an option. I agree to ask PCP for referral, since I sent her to [facility].  Dr. Lemmie Evens

## 2015-09-21 DIAGNOSIS — M79672 Pain in left foot: Secondary | ICD-10-CM | POA: Diagnosis not present

## 2015-09-21 DIAGNOSIS — L603 Nail dystrophy: Secondary | ICD-10-CM | POA: Diagnosis not present

## 2015-09-21 DIAGNOSIS — E1051 Type 1 diabetes mellitus with diabetic peripheral angiopathy without gangrene: Secondary | ICD-10-CM | POA: Diagnosis not present

## 2015-09-21 DIAGNOSIS — L03031 Cellulitis of right toe: Secondary | ICD-10-CM | POA: Diagnosis not present

## 2015-09-21 DIAGNOSIS — L03032 Cellulitis of left toe: Secondary | ICD-10-CM | POA: Diagnosis not present

## 2015-09-21 DIAGNOSIS — M79671 Pain in right foot: Secondary | ICD-10-CM | POA: Diagnosis not present

## 2015-09-21 DIAGNOSIS — I739 Peripheral vascular disease, unspecified: Secondary | ICD-10-CM | POA: Diagnosis not present

## 2015-09-22 DIAGNOSIS — Z79891 Long term (current) use of opiate analgesic: Secondary | ICD-10-CM | POA: Diagnosis not present

## 2015-09-22 DIAGNOSIS — G894 Chronic pain syndrome: Secondary | ICD-10-CM | POA: Diagnosis not present

## 2015-09-22 DIAGNOSIS — M5442 Lumbago with sciatica, left side: Secondary | ICD-10-CM | POA: Diagnosis not present

## 2015-09-22 DIAGNOSIS — M5432 Sciatica, left side: Secondary | ICD-10-CM | POA: Diagnosis not present

## 2015-09-22 DIAGNOSIS — G890 Central pain syndrome: Secondary | ICD-10-CM | POA: Diagnosis not present

## 2015-09-22 DIAGNOSIS — M5431 Sciatica, right side: Secondary | ICD-10-CM | POA: Diagnosis not present

## 2015-09-22 DIAGNOSIS — M5416 Radiculopathy, lumbar region: Secondary | ICD-10-CM | POA: Diagnosis not present

## 2015-09-26 ENCOUNTER — Encounter: Payer: Self-pay | Admitting: Family Medicine

## 2015-09-26 ENCOUNTER — Ambulatory Visit (INDEPENDENT_AMBULATORY_CARE_PROVIDER_SITE_OTHER): Payer: Medicare Other | Admitting: Family Medicine

## 2015-09-26 ENCOUNTER — Telehealth: Payer: Self-pay | Admitting: Family Medicine

## 2015-09-26 VITALS — BP 120/60 | HR 91 | Temp 98.5°F | Resp 16 | Wt 211.3 lb

## 2015-09-26 DIAGNOSIS — J309 Allergic rhinitis, unspecified: Secondary | ICD-10-CM

## 2015-09-26 DIAGNOSIS — E876 Hypokalemia: Secondary | ICD-10-CM

## 2015-09-26 DIAGNOSIS — N183 Chronic kidney disease, stage 3 unspecified: Secondary | ICD-10-CM

## 2015-09-26 DIAGNOSIS — I5040 Unspecified combined systolic (congestive) and diastolic (congestive) heart failure: Secondary | ICD-10-CM | POA: Diagnosis not present

## 2015-09-26 DIAGNOSIS — K922 Gastrointestinal hemorrhage, unspecified: Secondary | ICD-10-CM | POA: Diagnosis not present

## 2015-09-26 DIAGNOSIS — E559 Vitamin D deficiency, unspecified: Secondary | ICD-10-CM

## 2015-09-26 DIAGNOSIS — M25562 Pain in left knee: Secondary | ICD-10-CM | POA: Diagnosis not present

## 2015-09-26 DIAGNOSIS — M25561 Pain in right knee: Secondary | ICD-10-CM | POA: Diagnosis not present

## 2015-09-26 DIAGNOSIS — M797 Fibromyalgia: Secondary | ICD-10-CM

## 2015-09-26 DIAGNOSIS — M159 Polyosteoarthritis, unspecified: Secondary | ICD-10-CM | POA: Diagnosis not present

## 2015-09-26 DIAGNOSIS — K219 Gastro-esophageal reflux disease without esophagitis: Secondary | ICD-10-CM | POA: Diagnosis not present

## 2015-09-26 LAB — CBC
HEMATOCRIT: 31.6 % — AB (ref 36.0–46.0)
HEMOGLOBIN: 10.3 g/dL — AB (ref 12.0–15.0)
MCHC: 32.6 g/dL (ref 30.0–36.0)
MCV: 79.7 fl (ref 78.0–100.0)
PLATELETS: 367 10*3/uL (ref 150.0–400.0)
RBC: 3.97 Mil/uL (ref 3.87–5.11)
RDW: 15.2 % (ref 11.5–15.5)
WBC: 12.9 10*3/uL — ABNORMAL HIGH (ref 4.0–10.5)

## 2015-09-26 LAB — COMPREHENSIVE METABOLIC PANEL
ALBUMIN: 4 g/dL (ref 3.5–5.2)
ALK PHOS: 83 U/L (ref 39–117)
ALT: 9 U/L (ref 0–35)
AST: 15 U/L (ref 0–37)
BILIRUBIN TOTAL: 0.3 mg/dL (ref 0.2–1.2)
BUN: 30 mg/dL — AB (ref 6–23)
CO2: 38 mEq/L — ABNORMAL HIGH (ref 19–32)
CREATININE: 1.28 mg/dL — AB (ref 0.40–1.20)
Calcium: 9.2 mg/dL (ref 8.4–10.5)
Chloride: 89 mEq/L — ABNORMAL LOW (ref 96–112)
GFR: 44.55 mL/min — ABNORMAL LOW (ref >60.00)
Glucose, Bld: 112 mg/dL — ABNORMAL HIGH (ref 70–99)
POTASSIUM: 3.4 meq/L — AB (ref 3.5–5.1)
SODIUM: 138 meq/L (ref 135–145)
TOTAL PROTEIN: 7.1 g/dL (ref 6.0–8.3)

## 2015-09-26 LAB — VITAMIN D 25 HYDROXY (VIT D DEFICIENCY, FRACTURES): VITD: 9.54 ng/mL — AB (ref 30.00–100.00)

## 2015-09-26 LAB — URIC ACID: Uric Acid, Serum: 8.6 mg/dL — ABNORMAL HIGH (ref 2.4–7.0)

## 2015-09-26 MED ORDER — FLUTICASONE PROPIONATE 50 MCG/ACT NA SUSP: 1.0000 | Freq: Every day | NASAL | Status: DC | PRN

## 2015-09-26 MED ORDER — MONTELUKAST SODIUM 10 MG PO TABS: 10.0000 mg | ORAL_TABLET | Freq: Every day | ORAL | Status: DC

## 2015-09-26 MED ORDER — ONDANSETRON 4 MG PO TBDP: 4.0000 mg | ORAL_TABLET | Freq: Two times a day (BID) | ORAL | Status: DC | PRN

## 2015-09-26 MED ORDER — POTASSIUM CHLORIDE CRYS ER 20 MEQ PO TBCR: 20.0000 meq | EXTENDED_RELEASE_TABLET | Freq: Three times a day (TID) | ORAL | Status: DC

## 2015-09-26 MED ORDER — PANTOPRAZOLE SODIUM 20 MG PO TBEC: 20.0000 mg | DELAYED_RELEASE_TABLET | Freq: Two times a day (BID) | ORAL | Status: DC

## 2015-09-26 NOTE — Telephone Encounter (Signed)
Updated Pharmacy

## 2015-09-26 NOTE — Patient Instructions (Addendum)
Pre visit review using our clinic review tool, if applicable. No additional management support is needed unless otherwise documented below in the visit note.   A few things to remember from today's visit:   1. CKD (chronic kidney disease), stage III  - Comprehensive metabolic panel - VITAMIN D 25 Hydroxy (Vit-D Deficiency, Fractures) - CBC  2. Combined systolic and diastolic congestive heart failure, unspecified congestive heart failure chronicity ([place])   3. Gastroesophageal reflux disease, esophagitis presence not specified  - ondansetron (ZOFRAN-ODT) 4 MG disintegrating tablet; Take 1 tablet (4 mg total) by mouth 2 (two) times daily as needed for nausea or vomiting.  Dispense: 30 tablet; Refill: 1 - pantoprazole (PROTONIX) 20 MG tablet; Take 1 tablet (20 mg total) by mouth 2 (two) times daily before a meal.  Dispense: 180 tablet; Refill: 1  4. Fibromyalgia  - VITAMIN D 25 Hydroxy (Vit-D Deficiency, Fractures)  5. Generalized osteoarthritis of multiple sites   6. Hypokalemia - potassium chloride SA (K-DUR,KLOR-CON) 20 MEQ tablet; Take 1 tablet (20 mEq total) by mouth 3 (three) times daily.  Dispense: 270 tablet; Refill: 0  7. Allergic rhinitis, unspecified allergic rhinitis type  - montelukast (SINGULAIR) 10 MG tablet; Take 1 tablet (10 mg total) by mouth at bedtime.  Dispense: 90 tablet; Refill: 1 - fluticasone (FLONASE) 50 MCG/ACT nasal spray; Place 1 spray into both nostrils daily as needed for allergies or rhinitis.  Dispense: 32 g; Refill: 1   Patient described today does not suggest gout. Continue following with pain clinic. For the recommendations would be teething according to lab results today. As we discussed being cautious with some of the medications you were taking, especially opioids as oxycodone. They noted he was discontinued. You can try Allegra 180 mg (palin) over-the-counter or continue cetirizine 10 mg.  Gastroenterology appt will be arranged.   If  you sign-up for My chart, you can communicate easier with [place] in case you have any question or concern.

## 2015-09-26 NOTE — Progress Notes (Addendum)
Subjective:    Patient ID: Denise Freeman, female    DOB: Mar 24, 1951, 65 y.o.   MRN: [id number]  HPI   [name] is a 65 y.o.female here today complaining of knee pain, she also has multiple complaints today and requesting refills for some of her medications.  She initially arranged appt because she is concerned about gout, according to pt, it was check some time ago and was told she "had it." She is c/o arthalgia, mainly knees, for years now, getting worse, sharp/achy, exacerbated by movement and relieved some with pain medication., rest, and knee braces. She has had knee intra articular injections. + Edema. No erythema. Also arthralgias of shoulders, back, wrists, and hands.  Hx of fibromyalgia and OA, she follows with pain clinic, currently she is on Oxycodone 5 mg bid. She would like for me to continue prescribing her Oxycodone.  She describes pain as severe, limits her daily activities, she tries not to move much to avoid pain. Today she is on a motorized scooter, at home she has a walker and with knee braces she can wolk some. Reporting frequent falls due to "weak" legs, Hx of peripheral neuropathy.She states that she drops object from hands all the time.   GERD: Currently she is on Protonix 20 mg and has taken it for years. Still having acid reflux and occasionally clear vomiting. She also is requesting Zofran refills. Also mentions lower abdominal pain for years, Hx of diverticulosis.No changes in bowel habits.  Reports blood in stool and "black" stool since 06/2015 when she had GI bleed, which she attributes to Xarelto. She has Hx of atrial fib and not on anticoagulation. Hx of iron def anemia.   She states that she is due for colonoscopy, not sure she can tolerate prep but would like to discuss other options. She tells me that she eats small portions and tries to avoid food that trigger symptoms.   Allergic rhinitis: On Singulair and Cetirizine 10  mg.She also takes OTC Benadryl as needed. Flonase nasal spray daily. + Skin pruritus, no rash, chronic, antihistaminic helps.  Hx of OSA and COPD: follows with pulmonologist.She is on supplemental O2 3 LPM. Bipolar disorder: follows with psychiatrists. Chronic pain follows with pain clinic. DM II: follows with endocrinologists. Hx of CKD III, she tells me she was not aware. She has not noted decreased urine output. Gross hematuria, or foam in urine.     Review of Systems  Constitutional: Positive for fatigue (no more than usual). Negative for fever, activity change, appetite change and unexpected weight change.  HENT: Positive for rhinorrhea and sneezing. Negative for facial swelling, mouth sores, nosebleeds, sore throat and trouble swallowing.   Eyes: Positive for itching. Negative for discharge and visual disturbance.  Respiratory: Positive for apnea (Hx of OSA), cough, shortness of breath (exertional) and wheezing.        Respiratory symptoms chronic, related to COPD.  Cardiovascular: Negative for chest pain and leg swelling.  Gastrointestinal: Positive for nausea, vomiting, abdominal pain (chronic/lower abdomen) and blood in stool. Negative for constipation.  Genitourinary: Negative for dysuria, hematuria and decreased urine volume.       Negative for urine incontinence.  Musculoskeletal: Positive for myalgias, back pain, joint swelling, arthralgias and neck pain.  Skin: Negative for color change and rash.       Pruritus (chronic).   Allergic/Immunologic: Positive for environmental allergies.  Neurological: Positive for weakness (extremities,chronic). Negative for numbness and headaches.  Hematological: Negative for adenopathy.  Psychiatric/Behavioral: Negative for hallucinations and confusion. The patient is nervous/anxious.       Current Outpatient Prescriptions on File Prior to Visit  Medication Sig Dispense Refill  . albuterol (PROVENTIL) (2.5 MG/3ML) 0.083% nebulizer  solution Take 3 mLs (2.5 mg total) by nebulization every 6 (six) hours as needed for wheezing or shortness of breath. 1080 mL 0  . atorvastatin (LIPITOR) 20 MG tablet Take 1 tablet (20 mg total) by mouth daily. 30 tablet 1  . budesonide (PULMICORT) 0.5 MG/2ML nebulizer solution USE 2 ML (0.5 MG TOTAL) VIA NEBULIZER TWICE A DAY 360 mL 3  . cetirizine (ZYRTEC) 10 MG tablet Take 10 mg by mouth daily.    Marland Kitchen diltiazem (CARDIZEM) 30 MG tablet Take 1 tablet (30 mg total) by mouth 2 (two) times daily. 180 tablet 3  . docusate sodium (COLACE) 100 MG capsule Take 100 mg by mouth daily.    Marland Kitchen escitalopram (LEXAPRO) 10 MG tablet Take 10 mg by mouth at bedtime.     . famotidine (PEPCID) 20 MG tablet Take 20 mg by mouth 2 (two) times daily.    . fenofibrate 54 MG tablet Take 1 tablet (54 mg total) by mouth daily. 90 tablet 1  . furosemide (LASIX) 40 MG tablet Take 1.5 tablets (60 mg total) by mouth 2 (two) times daily. (Patient taking differently: Take 80 mg by mouth 2 (two) times daily. ) 270 tablet 3  . Insulin Glargine (TOUJEO SOLOSTAR) 300 UNIT/ML SOPN Inject 80 Units into the skin at bedtime. 18 pen 0  . insulin lispro (HUMALOG KWIKPEN) 100 UNIT/ML KiwkPen Inject 25-35 units under skin 3x a day 20 pen 1  . Insulin Pen Needle 32G X 4 MM MISC Use to inject insulin 4 times daily. 400 each 1  . ipratropium-albuterol (DUONEB) 0.5-2.5 (3) MG/3ML SOLN Take 3 mLs by nebulization 4 (four) times daily. 1080 mL 1  . metolazone (ZAROXOLYN) 2.5 MG tablet Take 1 tablet (2.5 mg total) by mouth daily. 90 tablet 3  . mirtazapine (REMERON) 15 MG tablet Take 15 mg by mouth at bedtime.    . modafinil (PROVIGIL) 200 MG tablet Take 1 tablet (200 mg total) by mouth daily. 90 tablet 0  . nitroGLYCERIN (NITROSTAT) 0.4 MG SL tablet Place 1 tablet (0.4 mg total) under the tongue every 5 (five) minutes as needed for chest pain. 25 tablet 3  . nystatin (MYCOSTATIN/NYSTOP) 100000 UNIT/GM POWD Apply topically 2 (two) times daily. Apply  under breasts    . oxyCODONE (OXY IR/ROXICODONE) 5 MG immediate release tablet Take 5 mg by mouth 2 (two) times daily.    . OXYGEN Inhale 3 L into the lungs continuous. 3 liters 24/7    . PRESCRIPTION MEDICATION Inhale into the lungs See admin instructions. Use BIPAP every time laying down    . primidone (MYSOLINE) 50 MG tablet Take 1 tablet (50 mg total) by mouth at bedtime. 90 tablet 1  . Probiotic Product (PROBIOTIC PO) Take 1 tablet by mouth at bedtime.     No current facility-administered medications on file prior to visit.     Past Medical History  Diagnosis Date  . Diabetes mellitus without complication ([place])   . Bipolar 1 disorder ([place])   . Fibromyalgia   . COPD (chronic obstructive pulmonary disease) ([place])   . CHF (congestive heart failure) ([place])   . Asthma   . Atrial fibrillation ([place])   . Neuropathy ([facility])     Disc Back   . Fibromyalgia   .  CHF (congestive heart failure) ([place])   . Coronary artery disease   . Anemia   . History of hiatal hernia   . GERD (gastroesophageal reflux disease)   . Shortness of breath dyspnea   . Family history of adverse reaction to anesthesia     Uncle was positive for malignant hyerthermia; patient had testing done and was negative.  . Myocardial infarction ([place]) 1990  . Sleep apnea     uses biPAP, 10  . Arthritis   . HOH (hard of hearing)     Social History   Social History  . Marital Status: Married    Spouse Name: N/A  . Number of Children: N/A  . Years of Education: N/A   Occupational History  . house wife    Social History Main Topics  . Smoking status: Former Smoker -- 2.00 packs/day for 14 years    Types: Cigarettes    Quit date: 04/12/1989  . Smokeless tobacco: Former Systems developer  . Alcohol Use: No  . Drug Use: No  . Sexual Activity: No   Other Topics Concern  . None   Social History Narrative   Married   Disabled   1 child; 48 yrs   6 pregnancies   1 child       Filed Vitals:   09/26/15 1304  BP: 120/60    Pulse: 91  Temp: 98.5 F (36.9 C)  Resp: 16   Body mass index is 42.65 kg/(m^2).  SpO2 Readings from Last 3 Encounters:  09/26/15 98%  08/28/15 97%  08/01/15 99%       Objective:   Physical Exam  Constitutional: She is oriented to person, place, and time. She appears well-developed. No distress.  HENT:  Head: Atraumatic.  Mouth/Throat: Oropharynx is clear and moist and mucous membranes are normal. No oral lesions.  Edentulous.  Eyes: Conjunctivae and EOM are normal. Pupils are equal, round, and reactive to light.  Neck: Neck supple.  Cardiovascular: Normal rate.  A regularly irregular rhythm present.  No murmur heard. DP present bilateral. A few varicose veins present bilateral. Trace pitting edema bilateral LE.  Pulmonary/Chest: Effort normal. No respiratory distress. She has no wheezes. She has no rhonchi. She has no rales. She exhibits no tenderness.  Mild hypoventilation, diffuse. On supplemental O2 3 LPM per [place].  Abdominal: Soft. There is no tenderness.  Musculoskeletal: She exhibits no edema or tenderness.       Right shoulder: She exhibits decreased range of motion.       Left shoulder: She exhibits decreased range of motion.       Lumbar back: She exhibits no tenderness, no edema, no pain and no spasm.  Knee: voices severe pain upon light touch or movement. No erythema, no local heat, mild effusion, no deformity. Limitation of flexion due to pain. Wrist: limitation of flexion, no edema or erythema. Shoulders abduction up to 90 degree, limited active and passive ROM. Back + trigger points, also on chest wall and extremities.  Lymphadenopathy:    She has no cervical adenopathy.  Neurological: She is alert and oriented to person, place, and time. Gait abnormal.  Reflex Scores:      Patellar reflexes are 2+ on the left side. In a motorized scooter.No focal deficit appreciated.  Skin: Skin is warm. No lesion noted.  Psychiatric: Her speech is normal. Her mood appears  anxious.  Good eye contact, talkative.       Assessment & Plan:   Diagnoses and all orders for  this visit:  CKD (chronic kidney disease), stage III Avoid NSAID's. Low salt diet. She is not on ACEI or ARB.  -     Comprehensive metabolic panel -     VITAMIN D 25 Hydroxy (Vit-D Deficiency, Fractures) -     CBC  Combined systolic and diastolic congestive heart failure, unspecified congestive heart failure chronicity (H[facility]) Stable. Caution with diuresis given hx of CKD. Low salt diet. Continue following with cardiologists.   Comments: [id number] Echo: Systolic function was mildly reduced, LVEF 45-50%, not able to evaluate diastolic function.  Gastroesophageal reflux disease, esophagitis presence not specified  Not well controlled. GERD diet discussed. Increase Protonix from 20 mg daily to bid and monitor for changes in symptoms. Gastroenterology referral placed.  -     ondansetron (ZOFRAN-ODT) 4 MG disintegrating tablet; Take 1 tablet (4 mg total) by mouth 2 (two) times daily as needed for nausea or vomiting. -     pantoprazole (PROTONIX) 20 MG tablet; Take 1 tablet (20 mg total) by mouth 2 (two) times daily before a meal.  Fibromyalgia Continue following with pain clinic. I am not taking over her pain management as she is requesting.  Side effects of opioids discussed. She has tried Cymbalta and Lyrica, did not tolerate either one.   -     VITAMIN D 25 Hydroxy (Vit-D Deficiency, Fractures)  Generalized osteoarthritis of multiple sites  Knee pain is related to OA, I explained.She still would like uric acid check.   -     Uric acid  Hypokalemia  No changes in current management, will follow labs done today and will give further recommendations accordingly.  -     potassium chloride SA (K-DUR,KLOR-CON) 20 MEQ tablet; Take 1 tablet (20 mEq total) by mouth 3 (three) times daily.  Allergic rhinitis, unspecified allergic rhinitis type Stable. No new recommendations  today. No changes in current management. F/U in 6-12 months.   -     montelukast (SINGULAIR) 10 MG tablet; Take 1 tablet (10 mg total) by mouth at bedtime. -     fluticasone (FLONASE) 50 MCG/ACT nasal spray; Place 1 spray into both nostrils daily as needed for allergies or rhinitis.  Arthralgia of both knees  Chronic, OA.       Explained that diuretic meds increase risk of gout and even if uric acid is high I am not planning on adding medications.  -     Uric acid  Gastrointestinal hemorrhage, unspecified gastritis, unspecified gastrointestinal hemorrhage type  Reporting melena and rectal bleed, daily. PPI increased. Avoid straining.  GI referral placed. CBC done today.   -     Ambulatory referral to Gastroenterology  Lab Results  Component Value Date   WBC 12.9* 09/26/2015   HGB 10.3* 09/26/2015   HCT 31.6* 09/26/2015   MCV 79.7 09/26/2015   PLT 367.0 09/26/2015     Chemistry      Component Value Date/Time   NA 138 09/26/2015 1416   K 3.4* 09/26/2015 1416   CL 89* 09/26/2015 1416   CO2 38* 09/26/2015 1416   BUN 30* 09/26/2015 1416   CREATININE 1.28* 09/26/2015 1416      Component Value Date/Time   CALCIUM 9.2 09/26/2015 1416   ALKPHOS 83 09/26/2015 1416   AST 15 09/26/2015 1416   ALT 9 09/26/2015 1416   BILITOT 0.3 09/26/2015 1416       She has multiple complaints, all reported as chronic. She will continue following with pulmonologist and psychiatrists for COPD/OSA and  bipolar disorder respectively.   -Patient advised to return or notify a doctor immediately if symptoms worsen or persist or new concerns arise.  Betty G. Martinique, MD  [facility]. [place] office.

## 2015-09-27 ENCOUNTER — Telehealth: Payer: Self-pay | Admitting: Family Medicine

## 2015-09-27 MED ORDER — ERGOCALCIFEROL 1.25 MG (50000 UT) PO CAPS: 50000.0000 [IU] | ORAL_CAPSULE | ORAL | Status: DC

## 2015-09-27 NOTE — Telephone Encounter (Signed)
Pt returning your call. Please call back °

## 2015-09-27 NOTE — Addendum Note (Signed)
Addended by: Ailene Rud E on: 09/27/2015 08:59 AM   Modules accepted: Orders

## 2015-09-28 NOTE — Telephone Encounter (Signed)
Been trying to get in contact with pt but phone is busy

## 2015-10-02 ENCOUNTER — Other Ambulatory Visit: Payer: Self-pay | Admitting: Internal Medicine

## 2015-10-02 MED ORDER — INSULIN LISPRO 100 UNIT/ML (KWIKPEN): PEN_INJECTOR | SUBCUTANEOUS | Status: DC

## 2015-10-02 NOTE — Telephone Encounter (Signed)
Refills sent to [facility].

## 2015-10-02 NOTE — Telephone Encounter (Signed)
humalog pens sent and [facility]

## 2015-10-04 ENCOUNTER — Telehealth: Payer: Self-pay | Admitting: Internal Medicine

## 2015-10-04 NOTE — Telephone Encounter (Signed)
Rx submitted on 10/02/2015 [facility]

## 2015-10-04 NOTE — Telephone Encounter (Signed)
Patient need refill of her medication TOUJEO SOLOSTAR 300 UNIT/ML SOPN, insulin lispro (HUMALOG KWIKPEN) 100 UNIT/ML KiwkPen, send to  [place], [place] [phone] (Phone) [phone] (Fax)

## 2015-10-06 ENCOUNTER — Telehealth: Payer: Self-pay | Admitting: Pulmonary Disease

## 2015-10-06 ENCOUNTER — Ambulatory Visit: Payer: Medicare Other

## 2015-10-06 NOTE — Telephone Encounter (Signed)
Called spoke with pt. She states she needs a refill on her   modafinil 200mg  Take 1 tablet by mouth daily #90 with 0 refills Last refilled 05/19/15  I explained to her that I would need to send a message to VS for approval. Verified pharmacy as [facility]. She voiced understanding and had no further questions.   VS please advise

## 2015-10-09 DIAGNOSIS — L03031 Cellulitis of right toe: Secondary | ICD-10-CM | POA: Diagnosis not present

## 2015-10-09 MED ORDER — MODAFINIL 200 MG PO TABS: 200.0000 mg | ORAL_TABLET | Freq: Every day | ORAL | Status: DC

## 2015-10-09 NOTE — Telephone Encounter (Signed)
Rx printed and given to Ashtyn to have VS sign.  Will forward to Ashtyn for f/u.

## 2015-10-09 NOTE — Telephone Encounter (Signed)
Okay to send refill. 

## 2015-10-09 NOTE — Telephone Encounter (Signed)
Rx returned to triage. Will send to triage to document if faxed.

## 2015-10-09 NOTE — Telephone Encounter (Signed)
Rx has been faxed. Pt is aware. Nothing further was needed.

## 2015-10-19 DIAGNOSIS — M5416 Radiculopathy, lumbar region: Secondary | ICD-10-CM | POA: Diagnosis not present

## 2015-10-19 DIAGNOSIS — G894 Chronic pain syndrome: Secondary | ICD-10-CM | POA: Diagnosis not present

## 2015-10-19 DIAGNOSIS — G890 Central pain syndrome: Secondary | ICD-10-CM | POA: Diagnosis not present

## 2015-10-19 DIAGNOSIS — M5442 Lumbago with sciatica, left side: Secondary | ICD-10-CM | POA: Diagnosis not present

## 2015-10-19 DIAGNOSIS — Z79891 Long term (current) use of opiate analgesic: Secondary | ICD-10-CM | POA: Diagnosis not present

## 2015-10-19 DIAGNOSIS — G603 Idiopathic progressive neuropathy: Secondary | ICD-10-CM | POA: Diagnosis not present

## 2015-10-26 ENCOUNTER — Ambulatory Visit: Payer: Medicare Other | Admitting: Family Medicine

## 2015-10-31 ENCOUNTER — Encounter: Payer: Self-pay | Admitting: Internal Medicine

## 2015-10-31 ENCOUNTER — Ambulatory Visit (INDEPENDENT_AMBULATORY_CARE_PROVIDER_SITE_OTHER): Payer: Medicare Other | Admitting: Internal Medicine

## 2015-10-31 VITALS — BP 98/60 | HR 86 | Ht 59.0 in | Wt 210.6 lb

## 2015-10-31 DIAGNOSIS — K922 Gastrointestinal hemorrhage, unspecified: Secondary | ICD-10-CM

## 2015-10-31 DIAGNOSIS — J438 Other emphysema: Secondary | ICD-10-CM

## 2015-10-31 DIAGNOSIS — I480 Paroxysmal atrial fibrillation: Secondary | ICD-10-CM

## 2015-10-31 DIAGNOSIS — E785 Hyperlipidemia, unspecified: Secondary | ICD-10-CM

## 2015-10-31 DIAGNOSIS — I5032 Chronic diastolic (congestive) heart failure: Secondary | ICD-10-CM | POA: Diagnosis not present

## 2015-10-31 DIAGNOSIS — D509 Iron deficiency anemia, unspecified: Secondary | ICD-10-CM

## 2015-10-31 MED ORDER — NITROGLYCERIN 0.4 MG SL SUBL: 0.4000 mg | SUBLINGUAL_TABLET | SUBLINGUAL | Status: DC | PRN

## 2015-10-31 MED ORDER — ATORVASTATIN CALCIUM 20 MG PO TABS: 20.0000 mg | ORAL_TABLET | Freq: Every day | ORAL | Status: DC

## 2015-10-31 MED ORDER — FUROSEMIDE 40 MG PO TABS: 80.0000 mg | ORAL_TABLET | Freq: Two times a day (BID) | ORAL | Status: DC

## 2015-10-31 MED ORDER — DILTIAZEM HCL 30 MG PO TABS: 30.0000 mg | ORAL_TABLET | Freq: Two times a day (BID) | ORAL | Status: DC

## 2015-10-31 MED ORDER — METOLAZONE 2.5 MG PO TABS: 2.5000 mg | ORAL_TABLET | Freq: Every day | ORAL | Status: DC

## 2015-10-31 NOTE — Patient Instructions (Addendum)
You have been referred to Dr. Carlean Purl - [facility]  Your physician wants you to follow-up in: 6 months with [name]. You will receive a reminder letter in the mail two months in advance. If you don't receive a letter, please call our office to schedule the follow-up appointment.  Your medications have been refilled

## 2015-10-31 NOTE — Progress Notes (Signed)
OFFICE NOTE  Chief Complaint:  Follow-up hospitalization  Primary Care Physician: Betty Martinique, MD  HPI:  Denise Freeman is a pleasant 65 year old female who is establishing cardiac care today. She is accompanied by her husband and they recently moved here from [place], [place]. She has a history of severe COPD on home oxygen. She also has a history of stress-induced cardiomyopathy in the past. She has since had recovery of her EF. She's had numerous cardiac catheterizations. None of which showed obstructive coronary disease. Her last cardiac catheterization was in October 2014 which demonstrated no significant coronary disease. This was after a small abnormality was noted in the apex suggestive of ischemia on a nuclear stress test. She does have a history of permanent atrial fibrillation on Coumadin. She will need to have her INRs followed here. Unfortunate she has not had her INR checked in over 9 weeks and it was assessed today and was low. We will need to adjust her medication. She will be established in our anticoagulation clinic. Blood pressure looks well controlled today. She is on cholesterol medication.  I saw Denise Freeman back today in the office. Unfortunate she was hospitalized in December for worsening shortness of breath and probable pneumonia with a mild diastolic heart failure exacerbation. She was given IV diuretics and her Lasix was increased to 40 mg 3 times a day. She's also taking metolazone 3 times weekly. She continues to complain of leg edema which is mostly dependent. She has significant shortness of breath and CO2 retention and an appointment with a pulmonologist is pending.  Denise Freeman returned today back in the office. Her main complaints are a number of excoriations and weeping lesions on her face and arms. She apparently is going to see a dermatologist about this. She's also had worsening leg swelling and shortness of breath. Her weight is now up about 8 pounds  since her last visit. She was instructed to take Lasix 40 mg 3 times a day but apparently she is taking it twice a day. She is taking the metolazone 3 times a week. She is wearing compression stockings which I had advised and seems that this helps her swelling.  Denise Freeman returns today and reports a big improvement in her leg swelling. She's currently on Lasix 60 mg twice daily and metolazone 3x weekly. Weight is come down and swelling is improved. Her BNP was over 250 and is come down to about 125.  I saw Denise Freeman back today in follow-up. She's been successful losing over 15 pounds which I congratulated her on. Her breathing continues to be fairly difficult. She is using BiPAP at night. She's been in the ER a few times for mostly difficulty breathing and COPD exacerbation. She tells me the other day she was in church and had an episode of sharp chest pain in her left anterior chest and left scapular area. She was quite upset and emotional today time. Recently she's been the target of possibly a scam, so that she is under a lot of stress. She was upset that she did not have anything to take for her chest discomfort. I tried to reassure her that her coronary arteries have look normal with multiple catheterizations. She does have some diastolic dysfunction I suspect her LVEDP goes up when she gets upset causing her chest discomfort.  10/31/2015  Denise Freeman was seen today in follow-up from her recent hospitalization. She was seen in January for acute GI bleeding thought to be  upper GI bleeding and required transfusion for hemoglobin of 5. She was evaluated by Dr. Silvano Rusk with [facility] who recommended an EGD. Unfortunately, it looks like she was discharged the next day, possibly [place] after she removed her IV and stated she needed to go home. She relates that she never saw the specialist and does not recall seeing Dr. Carlean Purl. Since that time she's continued to have blood  in her stool. She was taken off of Xarelto and not restarted on anticoagulation. She does have a high CHADSVASC score of 5, suggesting a higher risk of stroke.  PMHx:  Past Medical History  Diagnosis Date  . Diabetes mellitus without complication ([place])   . Bipolar 1 disorder ([place])   . Fibromyalgia   . COPD (chronic obstructive pulmonary disease) ([place])   . CHF (congestive heart failure) ([place])   . Asthma   . Atrial fibrillation ([place])   . Neuropathy ([facility])     Disc Back   . Fibromyalgia   . CHF (congestive heart failure) ([place])   . Coronary artery disease   . Anemia   . History of hiatal hernia   . GERD (gastroesophageal reflux disease)   . Shortness of breath dyspnea   . Family history of adverse reaction to anesthesia     Uncle was positive for malignant hyerthermia; patient had testing done and was negative.  . Myocardial infarction ([place]) 1990  . Sleep apnea     uses biPAP, 10  . Arthritis   . HOH (hard of hearing)     Past Surgical History  Procedure Laterality Date  . Heel spur surgery Bilateral   . Appendectomy    . Bladder suspension      2003, 2006 and 2010  . Neck surgery N/A 2009    4, 6, and 7 cervical disc replaced  . Bladder stimulator      pt states, "it cannot be turned off".  . Hernia repair      umbilical  . Cataract extraction w/phaco Right 11/29/2014    Procedure: CATARACT EXTRACTION PHACO AND INTRAOCULAR LENS PLACEMENT (IOC);  Surgeon: [place] Guys, MD;  Location: [facility];  Service: Ophthalmology;  Laterality: Right;  CDE:3.81  . Cataract extraction w/phaco Left 12/13/2014    Procedure: CATARACT EXTRACTION PHACO AND INTRAOCULAR LENS PLACEMENT (IOC);  Surgeon: [place] Guys, MD;  Location: [facility];  Service: Ophthalmology;  Laterality: Left;  CDE:6.59  . I&d extremity Right 06/13/2015    Procedure: MINOR IRRIGATION AND DEBRIDEMENT EXTREMITY REMOVAL OF NAIL;  Surgeon: Daryll Brod, MD;  Location: [facility];  Service: Orthopedics;  Laterality: Right;     FAMHx:  Family History  Problem Relation Age of Onset  . Heart disease Mother   . COPD Mother   . Cancer Mother     Breast cancer  . Diabetes Mother   . Heart disease Father   . COPD Sister   . Heart disease Sister   . Diabetes Sister   . Heart disease Maternal Grandmother     SOCHx:   reports that she quit smoking about 26 years ago. Her smoking use included Cigarettes. She has a 28 pack-year smoking history. She has quit using smokeless tobacco. She reports that she does not drink alcohol or use illicit drugs.  ALLERGIES:  Allergies  Allergen Reactions  . Ancef [Cefazolin] Nausea And Vomiting  . Augmentin [Amoxicillin-Pot Clavulanate] Itching  . Ciprofloxacin Itching  . Haldol [Haloperidol] Other (See Comments)    Restless leg  .  Levaquin [Levofloxacin In D5w] Other (See Comments)    "afib"  . Lyrica [Pregabalin] Hives  . Nsaids Diarrhea  . Tamiflu [Oseltamivir Phosphate] Other (See Comments)    "water blisters"  . Topiramate Nausea Only  . Zoloft [Sertraline Hcl] Other (See Comments)    Jaw problems, jittery  . Penicillins Itching, Nausea And Vomiting and Rash    Has patient had a PCN reaction causing immediate rash, facial/tongue/throat swelling, SOB or lightheadedness with hypotension: Yes Has patient had a PCN reaction causing severe rash involving mucus membranes or skin necrosis: No Has patient had a PCN reaction that required hospitalization No Has patient had a PCN reaction occurring within the last 10 years: Yes If all of the above answers are "NO", then may proceed with Cephalosporin use.    ROS: Pertinent items noted in HPI and remainder of comprehensive ROS otherwise negative.  HOME MEDS: Current Outpatient Prescriptions  Medication Sig Dispense Refill  . albuterol (PROVENTIL) (2.5 MG/3ML) 0.083% nebulizer solution Take 3 mLs (2.5 mg total) by nebulization every 6 (six) hours as needed for wheezing or shortness of breath. 1080 mL 0  . budesonide  (PULMICORT) 0.5 MG/2ML nebulizer solution USE 2 ML (0.5 MG TOTAL) VIA NEBULIZER TWICE A DAY 360 mL 3  . cetirizine (ZYRTEC) 10 MG tablet Take 10 mg by mouth daily.    Marland Kitchen docusate sodium (COLACE) 100 MG capsule Take 100 mg by mouth daily.    . ergocalciferol (VITAMIN D2) 50000 units capsule Take 1 capsule (50,000 Units total) by mouth once a week. For 16 weeks 16 capsule 1  . escitalopram (LEXAPRO) 10 MG tablet Take 10 mg by mouth at bedtime.     . fenofibrate 54 MG tablet Take 1 tablet (54 mg total) by mouth daily. 90 tablet 1  . fluticasone (FLONASE) 50 MCG/ACT nasal spray Place 1 spray into both nostrils daily as needed for allergies or rhinitis. 32 g 1  . insulin lispro (HUMALOG KWIKPEN) 100 UNIT/ML KiwkPen Inject 25-35 units under skin 3x a day 20 pen 1  . Insulin Pen Needle 32G X 4 MM MISC Use to inject insulin 4 times daily. 400 each 1  . ipratropium-albuterol (DUONEB) 0.5-2.5 (3) MG/3ML SOLN Take 3 mLs by nebulization 4 (four) times daily. 1080 mL 1  . mirtazapine (REMERON) 15 MG tablet Take 15 mg by mouth at bedtime.    . modafinil (PROVIGIL) 200 MG tablet Take 1 tablet (200 mg total) by mouth daily. 90 tablet 0  . montelukast (SINGULAIR) 10 MG tablet Take 1 tablet (10 mg total) by mouth at bedtime. 90 tablet 1  . nystatin (MYCOSTATIN/NYSTOP) 100000 UNIT/GM POWD Apply topically 2 (two) times daily. Apply under breasts    . ondansetron (ZOFRAN-ODT) 4 MG disintegrating tablet Take 1 tablet (4 mg total) by mouth 2 (two) times daily as needed for nausea or vomiting. 30 tablet 1  . oxyCODONE (OXY IR/ROXICODONE) 5 MG immediate release tablet Take 5 mg by mouth 2 (two) times daily.    . OXYGEN Inhale 3 L into the lungs continuous. 3 liters 24/7    . pantoprazole (PROTONIX) 20 MG tablet Take 1 tablet (20 mg total) by mouth 2 (two) times daily before a meal. 180 tablet 1  . potassium chloride SA (K-DUR,KLOR-CON) 20 MEQ tablet Take 1 tablet (20 mEq total) by mouth 3 (three) times daily. 270 tablet 0    . PRESCRIPTION MEDICATION Inhale into the lungs See admin instructions. Use BIPAP every time laying down    .  primidone (MYSOLINE) 50 MG tablet Take 1 tablet (50 mg total) by mouth at bedtime. 90 tablet 1  . Probiotic Product (PROBIOTIC PO) Take 1 tablet by mouth at bedtime.    Nelva Nay SOLOSTAR 300 UNIT/ML SOPN INJECT 80 UNITS UNDER THE SKIN AT BEDTIME 27 mL 1  . atorvastatin (LIPITOR) 20 MG tablet Take 1 tablet (20 mg total) by mouth daily. 90 tablet 3  . diltiazem (CARDIZEM) 30 MG tablet Take 1 tablet (30 mg total) by mouth 2 (two) times daily. 180 tablet 3  . furosemide (LASIX) 40 MG tablet Take 2 tablets (80 mg total) by mouth 2 (two) times daily. 360 tablet 3  . metolazone (ZAROXOLYN) 2.5 MG tablet Take 1 tablet (2.5 mg total) by mouth daily. 90 tablet 3  . nitroGLYCERIN (NITROSTAT) 0.4 MG SL tablet Place 1 tablet (0.4 mg total) under the tongue every 5 (five) minutes as needed for chest pain. 25 tablet 3   No current facility-administered medications for this visit.    LABS/IMAGING: No results found for this or any previous visit (from the past 48 hour(s)). No results found.  VITALS: BP 98/60 mmHg  Pulse 86  Ht 4\' 11"  (1.499 m)  Wt 210 lb 9.6 oz (95.528 kg)  BMI 42.51 kg/m2  SpO2 97%  EXAM: General appearance: alert, appears older than stated age, no distress and In a powered scooter Neck: JVD - 3 cm above sternal notch and no carotid bruit Lungs: diminished breath sounds bilaterally and wheezes bilaterally Heart: irregularly irregular rhythm Abdomen: soft, non-tender; bowel sounds normal; no masses,  no organomegaly and obese Extremities: edema 1+ bilateral, stockings in place Pulses: 2+ and symmetric Skin: Numerous, weeping lesions Neurologic: Grossly normal Psych: Somewhat manic  EKG: Atrial fibrillation with Controlled ventricular response at 86   ASSESSMENT: 1. Permanent atrial fibrillation - not on anticoagulation due to recent GI bleeding 2. Chronic diastolic  congestive heart failure 3. History of stress-induced cardiomyopathy with normalization of her EF 4. No significant obstructive coronary disease after multiple catheterizations in 1996, 99, 2007 and 2014. 5. Fibromyalgia 6. Bipolar 1 disorder 7. Dyslipidemia 8. Hypertension 9. Severe COPD on home oxygen 10. Obstructive sleep apnea on CPAP 11. Morbid obesity - with weight loss recently 12. DNR  PLAN: 1.   Denise Freeman had recent significant GI bleeding in January 2017. At that time she required transfusion and was seen by GI who recommended EGD. Unfortunately she left the hospital prior to that being performed. She has not had follow-up with GI. She reports continued GI bleeding. Recent hemoglobin however was increased to 10. She will need repeat GI evaluation before we could consider anticoagulation. I will refer her back to Dr. Carlean Purl for outpatient GI evaluation.  Pixie Casino, MD, [facility] Attending Cardiologist [place] 10/31/2015, 12:42 PM

## 2015-11-01 ENCOUNTER — Ambulatory Visit
Admission: RE | Admit: 2015-11-01 | Discharge: 2015-11-01 | Disposition: A | Discharge status: Home / Self Care | Payer: Medicare Other | Source: Ambulatory Visit

## 2015-11-01 DIAGNOSIS — Z1231 Encounter for screening mammogram for malignant neoplasm of breast: Secondary | ICD-10-CM | POA: Diagnosis not present

## 2015-11-02 ENCOUNTER — Telehealth: Payer: Self-pay | Admitting: *Deleted

## 2015-11-02 NOTE — Telephone Encounter (Signed)
Left message for patient to call regarding GI consult ordered by [name]

## 2015-11-06 NOTE — Telephone Encounter (Signed)
Spoke with patient and explained she will have to get notes from her visit with [facility] so we can send to Dr. Carlean Purl so it can be reviewed for a possible office visit.  She states she will try to call and get the note.

## 2015-11-14 DIAGNOSIS — Z961 Presence of intraocular lens: Secondary | ICD-10-CM | POA: Diagnosis not present

## 2015-11-15 NOTE — Telephone Encounter (Signed)
11-15-15 pt called to follow up on whether we have received these records-we have not-I called Dr. Lynnda Child office [phone] medical records she was told she would have to sign a release and she has not as of today-called and left this message on her voicemail

## 2015-11-16 DIAGNOSIS — M9900 Segmental and somatic dysfunction of head region: Secondary | ICD-10-CM | POA: Diagnosis not present

## 2015-11-22 DIAGNOSIS — T8189XA Other complications of procedures, not elsewhere classified, initial encounter: Secondary | ICD-10-CM | POA: Diagnosis not present

## 2015-11-23 ENCOUNTER — Ambulatory Visit: Payer: Medicare Other | Admitting: Acute Care

## 2015-11-23 DIAGNOSIS — G894 Chronic pain syndrome: Secondary | ICD-10-CM | POA: Diagnosis not present

## 2015-11-23 DIAGNOSIS — M5442 Lumbago with sciatica, left side: Secondary | ICD-10-CM | POA: Diagnosis not present

## 2015-11-23 DIAGNOSIS — Z79891 Long term (current) use of opiate analgesic: Secondary | ICD-10-CM | POA: Diagnosis not present

## 2015-11-23 DIAGNOSIS — M5431 Sciatica, right side: Secondary | ICD-10-CM | POA: Diagnosis not present

## 2015-11-23 DIAGNOSIS — M5416 Radiculopathy, lumbar region: Secondary | ICD-10-CM | POA: Diagnosis not present

## 2015-11-23 DIAGNOSIS — M5432 Sciatica, left side: Secondary | ICD-10-CM | POA: Diagnosis not present

## 2015-11-28 ENCOUNTER — Ambulatory Visit (INDEPENDENT_AMBULATORY_CARE_PROVIDER_SITE_OTHER): Payer: Medicare Other | Admitting: Internal Medicine

## 2015-11-28 ENCOUNTER — Other Ambulatory Visit: Payer: Self-pay

## 2015-11-28 ENCOUNTER — Encounter: Payer: Self-pay | Admitting: Internal Medicine

## 2015-11-28 VITALS — BP 132/68 | HR 84 | Wt 203.0 lb

## 2015-11-28 DIAGNOSIS — N183 Chronic kidney disease, stage 3 unspecified: Secondary | ICD-10-CM

## 2015-11-28 DIAGNOSIS — E1322 Other specified diabetes mellitus with diabetic chronic kidney disease: Secondary | ICD-10-CM | POA: Diagnosis not present

## 2015-11-28 DIAGNOSIS — E1365 Other specified diabetes mellitus with hyperglycemia: Secondary | ICD-10-CM | POA: Diagnosis not present

## 2015-11-28 DIAGNOSIS — IMO0002 Reserved for concepts with insufficient information to code with codable children: Secondary | ICD-10-CM

## 2015-11-28 LAB — POCT GLYCOSYLATED HEMOGLOBIN (HGB A1C): HEMOGLOBIN A1C: 7.8

## 2015-11-28 MED ORDER — INSULIN GLARGINE 300 UNIT/ML ~~LOC~~ SOPN: 80.0000 [IU] | PEN_INJECTOR | Freq: Every day | SUBCUTANEOUS | Status: DC

## 2015-11-28 MED ORDER — INSULIN PEN NEEDLE 32G X 4 MM MISC: Status: DC

## 2015-11-28 MED ORDER — INSULIN LISPRO 100 UNIT/ML (KWIKPEN): PEN_INJECTOR | SUBCUTANEOUS | Status: DC

## 2015-11-28 MED ORDER — FENOFIBRATE 54 MG PO TABS: 54.0000 mg | ORAL_TABLET | Freq: Every day | ORAL | Status: DC

## 2015-11-28 NOTE — Progress Notes (Signed)
Patient ID: Denise Freeman, female   DOB: April 01, 1951, 65 y.o.   MRN: [id number]  HPI: [name] is a 65 y.o.-year-old female, initially referred by her PCP, Dr. Loman Chroman, for management of DM2, dx in 2009, insulin-dependent in 2013, uncontrolled, with complications (CKD, DR). Last visit 3 mo ago.   Last hemoglobin A1c was: Lab Results  Component Value Date   HGBA1C 7.8 11/28/2015   HGBA1C 6.6* 06/25/2015   HGBA1C 11.7 01/26/2015   12/30/2013: HbA1c 8.6% 12/28/2013: HbA1c 7.9% HbA1c 8.7% HbA1c 9.4% HbA1c 7.3%  Pt is on a regimen of: - Toujeo 80 units at bedtime - Humalog: 25 units before B 27 units before L  32 units before D - sliding scale of Humalog - 150-175: + 1 unit  - 176-200: + 2 units  - 201-225: + 3 units  - 226-250: + 4 units  - 251-275: + 5 units - >275: + 6 units We stopped Trulicity [id number] mg under skin weekly >> too expensive We stopped Januvia >> did not help She was on Metformin >> nausea.  Pt checks her sugars 4x a day and they: - am: 149-170 >> 158-191 >> 160-175, 180 >> 173-189 >> 160s >> 149-157 >> 127-160 >> 150-180 (better lately) - 2h after b'fast: n/c >> 34x1 - before lunch: 158-199 >> 170-199 >> 130-150 >> 167-191 >> 195-210 >> 160-183 >> 134-170 >> 155 - 2h after lunch: n/c - before dinner: 168-200 >> 175-190  >> 179-200 >> 140s (if no snack), 165-170 >> 175-187 >> 140-188 >> 165-170 - 2h after dinner: n/c - bedtime: 162-200, last week 210-235 >> 165-190 >> 169-238 >> 225-250 >> 180-209, 225 >> 163-188, 230 >> 190s - nighttime:n/c No lows. Lowest sugar was 130 >> 167 >> 140s >> 127 >> 34x1; she has hypoglycemia awareness at 70 at 120. Highest sugar was 240 >> 190 >> 200s >> 500 x1, otw 250s >> 230 >> 200s  Glucometer: FreeStyle  Pt's meals are: - Breakfast: toast with spread or oatmeal with splenda - Lunch: cottage cheese, or fruit - Dinner: chicken/beef/turkey with vegetables and a starch; macaroni and cheese - Snacks: 2  snacks   She tells me she eats between meals - almost incessantly.  - + CKD stage 3, last BUN/creatinine:  Lab Results  Component Value Date   BUN 30* 09/26/2015   CREATININE 1.28* 09/26/2015  On Lisinopril, fenofibrate. - last set of lipids: No results found for: CHOL, HDL, LDLCALC, LDLDIRECT, TRIG, CHOLHDL 09/07/2013: 180/135/31/138 On Crestor. - last eye exam was in 10/2014. + DR OU. She had cataract sx in 05 and 11/2014. - no numbness and tingling in her feet.  ROS: Constitutional: no weight loss, + fatigue, + poor sleep, + nocturia Eyes: no blurry vision, no xerophthalmia ENT: no sore throat, no nodules palpated in throat, no dysphagia/odynophagia, no hoarseness, + hypoacusis Cardiovascular: + CP/+ SOB/+ palpitations/+ leg swelling Respiratory: no cough/+ SOB/+ wheezing Gastrointestinal: + N/no V/+ D/no C, + heartburn Musculoskeletal: + muscle aches/+ joint aches Skin: no rashes, + easy bruising, + itching Neurological: no tremors/numbness/tingling/dizziness, + HA  I reviewed pt's medications, allergies, PMH, social hx, family hx, and changes were documented in the history of present illness. Otherwise, unchanged from my initial visit note:  Past Medical History  Diagnosis Date  . Diabetes mellitus without complication ([place])   . Bipolar 1 disorder ([place])   . Fibromyalgia   . COPD (chronic obstructive pulmonary disease) ([place])   . CHF (congestive heart failure) ([place])   .  Asthma   . Atrial fibrillation ([place])   . Neuropathy ([facility])     Disc Back   . Fibromyalgia   . CHF (congestive heart failure) ([place])   . Coronary artery disease   . Anemia   . History of hiatal hernia   . GERD (gastroesophageal reflux disease)   . Shortness of breath dyspnea   . Family history of adverse reaction to anesthesia     Uncle was positive for malignant hyerthermia; patient had testing done and was negative.  . Myocardial infarction ([place]) 1990  . Sleep apnea     uses biPAP, 10  .  Arthritis   . HOH (hard of hearing)    Past Surgical History  Procedure Laterality Date  . Heel spur surgery Bilateral   . Appendectomy    . Bladder suspension      2003, 2006 and 2010  . Neck surgery N/A 2009    4, 6, and 7 cervical disc replaced  . Bladder stimulator      pt states, "it cannot be turned off".  . Hernia repair      umbilical  . Cataract extraction w/phaco Right 11/29/2014    Procedure: CATARACT EXTRACTION PHACO AND INTRAOCULAR LENS PLACEMENT (IOC);  Surgeon: Rutherford Guys, MD;  Location: [facility];  Service: Ophthalmology;  Laterality: Right;  CDE:3.81  . Cataract extraction w/phaco Left 12/13/2014    Procedure: CATARACT EXTRACTION PHACO AND INTRAOCULAR LENS PLACEMENT (IOC);  Surgeon: Rutherford Guys, MD;  Location: [facility];  Service: Ophthalmology;  Laterality: Left;  CDE:6.59  . I&d extremity Right 06/13/2015    Procedure: MINOR IRRIGATION AND DEBRIDEMENT EXTREMITY REMOVAL OF NAIL;  Surgeon: Daryll Brod, MD;  Location: [facility];  Service: Orthopedics;  Laterality: Right;   History   Social History  . Marital Status: Married    Spouse Name: N/A    Number of Children: 1   Occupational History  . disabled   Social History Main Topics  . Smoking status: Former Smoker -- 2.00 packs/day for 14 years    Types: Cigarettes    Quit date: 04/12/1986  . Smokeless tobacco: Not on file  . Alcohol Use: No  . Drug Use: No   Current Outpatient Prescriptions on File Prior to Visit  Medication Sig Dispense Refill  . albuterol (PROVENTIL) (2.5 MG/3ML) 0.083% nebulizer solution Take 3 mLs (2.5 mg total) by nebulization every 6 (six) hours as needed for wheezing or shortness of breath. 1080 mL 0  . atorvastatin (LIPITOR) 20 MG tablet Take 1 tablet (20 mg total) by mouth daily. 90 tablet 3  . budesonide (PULMICORT) 0.5 MG/2ML nebulizer solution USE 2 ML (0.5 MG TOTAL) VIA NEBULIZER TWICE A DAY 360 mL 3  . cetirizine (ZYRTEC) 10 MG tablet Take 10 mg by mouth daily.    Marland Kitchen  diltiazem (CARDIZEM) 30 MG tablet Take 1 tablet (30 mg total) by mouth 2 (two) times daily. 180 tablet 3  . docusate sodium (COLACE) 100 MG capsule Take 100 mg by mouth daily.    . ergocalciferol (VITAMIN D2) 50000 units capsule Take 1 capsule (50,000 Units total) by mouth once a week. For 16 weeks 16 capsule 1  . escitalopram (LEXAPRO) 10 MG tablet Take 10 mg by mouth at bedtime.     . fenofibrate 54 MG tablet Take 1 tablet (54 mg total) by mouth daily. 90 tablet 1  . fluticasone (FLONASE) 50 MCG/ACT nasal spray Place 1 spray into both nostrils daily as needed for allergies or  rhinitis. 32 g 1  . furosemide (LASIX) 40 MG tablet Take 2 tablets (80 mg total) by mouth 2 (two) times daily. 360 tablet 3  . insulin lispro (HUMALOG KWIKPEN) 100 UNIT/ML KiwkPen Inject 25-35 units under skin 3x a day 20 pen 1  . Insulin Pen Needle 32G X 4 MM MISC Use to inject insulin 4 times daily. 400 each 1  . ipratropium-albuterol (DUONEB) 0.5-2.5 (3) MG/3ML SOLN Take 3 mLs by nebulization 4 (four) times daily. 1080 mL 1  . metolazone (ZAROXOLYN) 2.5 MG tablet Take 1 tablet (2.5 mg total) by mouth daily. 90 tablet 3  . mirtazapine (REMERON) 15 MG tablet Take 15 mg by mouth at bedtime.    . modafinil (PROVIGIL) 200 MG tablet Take 1 tablet (200 mg total) by mouth daily. 90 tablet 0  . montelukast (SINGULAIR) 10 MG tablet Take 1 tablet (10 mg total) by mouth at bedtime. 90 tablet 1  . nitroGLYCERIN (NITROSTAT) 0.4 MG SL tablet Place 1 tablet (0.4 mg total) under the tongue every 5 (five) minutes as needed for chest pain. 25 tablet 3  . nystatin (MYCOSTATIN/NYSTOP) 100000 UNIT/GM POWD Apply topically 2 (two) times daily. Apply under breasts    . ondansetron (ZOFRAN-ODT) 4 MG disintegrating tablet Take 1 tablet (4 mg total) by mouth 2 (two) times daily as needed for nausea or vomiting. 30 tablet 1  . oxyCODONE (OXY IR/ROXICODONE) 5 MG immediate release tablet Take 5 mg by mouth 2 (two) times daily.    . OXYGEN Inhale 3 L  into the lungs continuous. 3 liters 24/7    . pantoprazole (PROTONIX) 20 MG tablet Take 1 tablet (20 mg total) by mouth 2 (two) times daily before a meal. 180 tablet 1  . potassium chloride SA (K-DUR,KLOR-CON) 20 MEQ tablet Take 1 tablet (20 mEq total) by mouth 3 (three) times daily. 270 tablet 0  . PRESCRIPTION MEDICATION Inhale into the lungs See admin instructions. Use BIPAP every time laying down    . primidone (MYSOLINE) 50 MG tablet Take 1 tablet (50 mg total) by mouth at bedtime. 90 tablet 1  . Probiotic Product (PROBIOTIC PO) Take 1 tablet by mouth at bedtime.    Nelva Nay SOLOSTAR 300 UNIT/ML SOPN INJECT 80 UNITS UNDER THE SKIN AT BEDTIME 27 mL 1   No current facility-administered medications on file prior to visit.   Allergies  Allergen Reactions  . Ancef [Cefazolin] Nausea And Vomiting  . Augmentin [Amoxicillin-Pot Clavulanate] Itching  . Ciprofloxacin Itching  . Haldol [Haloperidol] Other (See Comments)    Restless leg  . Levaquin [Levofloxacin In D5w] Other (See Comments)    "afib"  . Lyrica [Pregabalin] Hives  . Nsaids Diarrhea  . Tamiflu [Oseltamivir Phosphate] Other (See Comments)    "water blisters"  . Topiramate Nausea Only  . Zoloft [Sertraline Hcl] Other (See Comments)    Jaw problems, jittery  . Penicillins Itching, Nausea And Vomiting and Rash    Has patient had a PCN reaction causing immediate rash, facial/tongue/throat swelling, SOB or lightheadedness with hypotension: Yes Has patient had a PCN reaction causing severe rash involving mucus membranes or skin necrosis: No Has patient had a PCN reaction that required hospitalization No Has patient had a PCN reaction occurring within the last 10 years: Yes If all of the above answers are "NO", then may proceed with Cephalosporin use.   Family History  Problem Relation Age of Onset  . Heart disease Mother   . COPD Mother   . Cancer  Mother     Breast cancer  . Diabetes Mother   . Heart disease Father   . COPD  Sister   . Heart disease Sister   . Diabetes Sister   . Heart disease Maternal Grandmother    PE: BP 132/68 mmHg  Pulse 84  Wt 203 lb (92.08 kg)  SpO2 97% Body mass index is 40.98 kg/(m^2). Wt Readings from Last 3 Encounters:  11/28/15 203 lb (92.08 kg)  10/31/15 210 lb 9.6 oz (95.528 kg)  09/26/15 211 lb 4.8 oz (95.845 kg)   Constitutional: obese, in wheelchair, in NAD, on oxygen Eyes: PERRLA, EOMI, no exophthalmos ENT: moist mucous membranes, no thyromegaly, no cervical lymphadenopathy Cardiovascular: irreg. Irreg. , No MRG Respiratory: CTA B Gastrointestinal: abdomen soft, NT, ND, BS+ Musculoskeletal: no deformities, strength intact in all 4 Skin: moist, warm; macular crusted spot on the right forehead ~1.5 cm in diameter  Neurological: + tremor with outstretched hands, DTR normal in all 4  ASSESSMENT: 1. DM2, insulin-dependent, uncontrolled, with complications - CKD stage 3 - DR  PLAN:  1. Patient with long-standing, uncontrolled diabetes ( improved HbA1c 11.7% >> 6.6%! At last visit), on basal-bolus insulin regimen, now with higher sugars, unexplained. We'll split the toujeo owing to increase the doses, since she is having uniformly high sugars throughout the day. She did notice though that sugars after breakfast can drop fast and she had a very low sugar of 34. We will decrease the insulin with breakfast and I advised her to follow the sliding scale as I gave her, since she was taking more units for correction. - I advised her to:  Patient Instructions  Please increase Toujeo to: - 40 units in am and 50 units at bedtime  Please decrease Humalog to:  - 20 units before b'fast - 27 units before lunch - 32 units before dinner  Continue:  Sliding scale of Humalog - 150-175: + 1 unit  - 176-200: + 2 units  - 201-225: + 3 units  - 226-250: + 4 units  - 251-275: + 5 units   - >275: + 6 units  Please return in 3 months with your sugar log.   - Strongly advised her to  continue checking sugars at different times of the day - check 4 times a day, rotating checks  - advised for yearly eye exams >> UTD - Check HbA1c today, and this is higher, at 7.8% - Return to clinic in 3 mo with sugar log

## 2015-11-28 NOTE — Patient Instructions (Signed)
Please increase Toujeo to: - 40 units in am and 50 units at bedtime  Please decrease Humalog to:  - 20 units before b'fast - 27 units before lunch - 32 units before dinner  Continue:  Sliding scale of Humalog - 150-175: + 1 unit  - 176-200: + 2 units  - 201-225: + 3 units  - 226-250: + 4 units  - 251-275: + 5 units   - >275: + 6 units  Please return in 3 months with your sugar log.

## 2015-11-28 NOTE — Telephone Encounter (Signed)
Patient was seen in office, had concerns of medication refills, ordered those medications for patient. No other concerns or questions.

## 2015-11-30 ENCOUNTER — Ambulatory Visit: Payer: Medicare Other | Admitting: Acute Care

## 2015-11-30 DIAGNOSIS — H468 Other optic neuritis: Secondary | ICD-10-CM | POA: Diagnosis not present

## 2015-11-30 DIAGNOSIS — E119 Type 2 diabetes mellitus without complications: Secondary | ICD-10-CM | POA: Diagnosis not present

## 2015-11-30 DIAGNOSIS — H53459 Other localized visual field defect, unspecified eye: Secondary | ICD-10-CM | POA: Diagnosis not present

## 2015-12-01 ENCOUNTER — Other Ambulatory Visit (HOSPITAL_COMMUNITY): Payer: Self-pay | Admitting: Ophthalmology

## 2015-12-01 DIAGNOSIS — H468 Other optic neuritis: Secondary | ICD-10-CM

## 2015-12-01 DIAGNOSIS — H53459 Other localized visual field defect, unspecified eye: Secondary | ICD-10-CM

## 2015-12-04 ENCOUNTER — Telehealth: Payer: Self-pay | Admitting: Gastroenterology

## 2015-12-04 NOTE — Telephone Encounter (Signed)
.  A user error has taken place: error

## 2015-12-07 ENCOUNTER — Encounter: Payer: Self-pay | Admitting: Neurology

## 2015-12-07 ENCOUNTER — Ambulatory Visit (INDEPENDENT_AMBULATORY_CARE_PROVIDER_SITE_OTHER): Payer: Medicare Other | Admitting: Neurology

## 2015-12-07 VITALS — BP 138/72 | HR 101

## 2015-12-07 DIAGNOSIS — G250 Essential tremor: Secondary | ICD-10-CM | POA: Diagnosis not present

## 2015-12-07 MED ORDER — PRIMIDONE 50 MG PO TABS: 50.0000 mg | ORAL_TABLET | Freq: Every day | ORAL | Status: DC

## 2015-12-07 NOTE — Progress Notes (Signed)
Denise Freeman was seen today in the movement disorders clinic for neurologic consultation at the request of Denise Martinique, MD.  The consultation is for the evaluation of tremor.  The records that were made available to me were reviewed.  Pt does have a hx of bipolar d/o but is not on medication specifically for this.  Med hx review does not reveal any exposure to antipsychotics.    Pt reports that tremor 3 years ago, but has gotten worse.  It started in both hands and has continued in both hands.  No leg tremor.  She is on albuterol/ipratropium nebulizer.  She uses the albuterol nebulizer form 4 times per day.  She estimates that she has been on that for a year.  She doesn't think that the nebulizer affects her tremor.  There is a fam hx of tremor in her mother.  02/28/15 update:  The patient is following up today. I reviewed her PCP records.   When I saw her in June, she was describing myoclonus and I suspected that was from gabapentin, but I also told her that I did not recommend gabapentin and Lyrica together and she was on both.  She really did not want to alter the medications, but tells me today that she did get off of the Lyrica in early August.  Despite that, she continues to have some of these movements.   She is still on gabapentin but thinks that she is on it twice per day.  She is unsure (called the pharmacy later and it appears that she is on 300 mg 3 times per day, down from 900 mg 3 times per day).  She reports that she has been on cymbalta previously for neuropathy and it caused hallucinations.  She did have some recent lab work done and her hemoglobin A1c was very elevated at 11.7 in August and in April it was 8.3.  She has both tremor and myoclonus and previously reported that she was not bothered by tremor, but now states that she is.  She asks me if there is some medication that could help both her neuropathy as well as her tremor, meanwhile asking me if she could get off of the  gabapentin.  03/23/15 update:  The patient is seen today in follow-up.  Last visit, I started her on Topamax.  She got up to 75 mg daily and stated that it caused itchiness and GI upset.    While I told her this would be a very unusual side effect, she felt pretty convinced that it was the medication.  She discontinued it 2 days ago.  Strangely, it did work well.  She admits that the tremor is starting to come back.  She admits that she still has GI upset despite going off of the topamax.  Only ate a banana today and that barely feels settled.  Diabetes is more out of control and getting some blood sugar readings as high as 600. Now that she is off of the gabapentin, the myoclonus went away completely.    06/08/15 update:  The patient is following up today.  I have reviewed records available to me since last visit, both from pulmonary as well as from cardiology.  The patient has a history of essential tremor, which has been worsened by nebulizer use.  We cautiously started her on primidone last visit, knowing that it could have a potential interaction with Xarelto.  The patient understood the implications of this.  She states that she is doing well in terms of tremor of tremor unless she gets upset and then it "gets aggravating."  She also has a history of peripheral neuropathy, but has had myoclonus with gabapentin in the past.  When asked about this, she has trouble focusing and tells me about her knee pain.  12/07/15 update:  The patient follows up today.  I have reviewed extensive records since last visit.  She is on primidone, 50 mg daily for tremor. States that it is well controlled with the primidone.   I told her in the past I did not want to increase this because she was on Xarelto and there is an interaction.  A few days after I saw her, she had an incision and drainage for thumb paranychia.  She ended right back up in the hospital on January 21 because of GI bleed.  She has not been on Xarelto ever  since.  She was transfused 3 units.  It appears that perhaps she sought GI in [facility], but has otherwise had some difficulty getting GI follow-up ever since then.  [facility] refused to see the patient.  Dr. Carlean Purl saw her in the hospital but she states that he won't see her again until records received from [facility].  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  She went to [facility] and got antibiotics for it.    ALLERGIES:   Allergies  Allergen Reactions  . Ancef [Cefazolin] Nausea And Vomiting  . Augmentin [Amoxicillin-Pot Clavulanate] Itching  . Ciprofloxacin Itching  . Haldol [Haloperidol] Other (See Comments)    Restless leg  . Levaquin [Levofloxacin In D5w] Other (See Comments)    "afib"  . Lyrica [Pregabalin] Hives  . Nsaids Diarrhea  . Tamiflu [Oseltamivir Phosphate] Other (See Comments)    "water blisters"  . Topiramate Nausea Only  . Zoloft [Sertraline Hcl] Other (See Comments)    Jaw problems, jittery  . Penicillins Itching, Nausea And Vomiting and Rash    Has patient had a PCN reaction causing immediate rash, facial/tongue/throat swelling, SOB or lightheadedness with hypotension: Yes Has patient had a PCN reaction causing severe rash involving mucus membranes or skin necrosis: No Has patient had a PCN reaction that required hospitalization No Has patient had a PCN reaction occurring within the last 10 years: Yes If all of the above answers are "NO", then may proceed with Cephalosporin use.    CURRENT MEDICATIONS:  Outpatient Encounter Prescriptions as of 12/07/2015  Medication Sig  . albuterol (PROVENTIL) (2.5 MG/3ML) 0.083% nebulizer solution Take 3 mLs (2.5 mg total) by nebulization every 6 (six) hours as needed for wheezing or shortness of breath.  Marland Kitchen atorvastatin (LIPITOR) 20 MG tablet Take 1 tablet (20 mg total) by mouth daily.  . budesonide (PULMICORT) 0.5 MG/2ML nebulizer solution USE 2 ML (0.5 MG TOTAL) VIA NEBULIZER TWICE A DAY    . diltiazem (CARDIZEM) 30 MG tablet Take 1 tablet (30 mg total) by mouth 2 (two) times daily.  Marland Kitchen docusate sodium (COLACE) 100 MG capsule Take 100 mg by mouth daily.  . ergocalciferol (VITAMIN D2) 50000 units capsule Take 1 capsule (50,000 Units total) by mouth once a week. For 16 weeks  . escitalopram (LEXAPRO) 10 MG tablet Take 10 mg by mouth at bedtime.   . fenofibrate 54 MG tablet Take 1 tablet (54 mg total) by mouth daily.  . fluticasone (FLONASE) 50 MCG/ACT nasal spray Place 1 spray  into both nostrils daily as needed for allergies or rhinitis.  . furosemide (LASIX) 40 MG tablet Take 2 tablets (80 mg total) by mouth 2 (two) times daily.  . Insulin Glargine (TOUJEO SOLOSTAR) 300 UNIT/ML SOPN Inject 80 Units into the skin at bedtime.  . insulin lispro (HUMALOG KWIKPEN) 100 UNIT/ML KiwkPen Inject 25-35 units under skin 3x a day  . Insulin Pen Needle 32G X 4 MM MISC Use to inject insulin 4 times daily.  Marland Kitchen ipratropium-albuterol (DUONEB) 0.5-2.5 (3) MG/3ML SOLN Take 3 mLs by nebulization 4 (four) times daily.  . metolazone (ZAROXOLYN) 2.5 MG tablet Take 1 tablet (2.5 mg total) by mouth daily.  . mirtazapine (REMERON) 15 MG tablet Take 15 mg by mouth at bedtime.  . modafinil (PROVIGIL) 200 MG tablet Take 1 tablet (200 mg total) by mouth daily.  . montelukast (SINGULAIR) 10 MG tablet Take 1 tablet (10 mg total) by mouth at bedtime.  . nitroGLYCERIN (NITROSTAT) 0.4 MG SL tablet Place 1 tablet (0.4 mg total) under the tongue every 5 (five) minutes as needed for chest pain.  Marland Kitchen nystatin (MYCOSTATIN/NYSTOP) 100000 UNIT/GM POWD Apply topically 2 (two) times daily. Apply under breasts  . ondansetron (ZOFRAN-ODT) 4 MG disintegrating tablet Take 1 tablet (4 mg total) by mouth 2 (two) times daily as needed for nausea or vomiting.  Marland Kitchen oxyCODONE (OXY IR/ROXICODONE) 5 MG immediate release tablet Take 5 mg by mouth 2 (two) times daily.  . OXYGEN Inhale 3 L into the lungs continuous. 3 liters 24/7  . pantoprazole  (PROTONIX) 20 MG tablet Take 1 tablet (20 mg total) by mouth 2 (two) times daily before a meal.  . potassium chloride SA (K-DUR,KLOR-CON) 20 MEQ tablet Take 1 tablet (20 mEq total) by mouth 3 (three) times daily.  Marland Kitchen PRESCRIPTION MEDICATION Inhale into the lungs See admin instructions. Use BIPAP every time laying down  . primidone (MYSOLINE) 50 MG tablet Take 1 tablet (50 mg total) by mouth at bedtime.  . Probiotic Product (PROBIOTIC PO) Take 1 tablet by mouth at bedtime.  . Vitamins A & D (VITAMIN A & D PO) Take by mouth.  . [DISCONTINUED] cetirizine (ZYRTEC) 10 MG tablet Take 10 mg by mouth daily.  . [DISCONTINUED] diazepam (VALIUM) 2 MG tablet Take 2 mg by mouth.  . [DISCONTINUED] sulfamethoxazole-trimethoprim (BACTRIM DS,SEPTRA DS) 800-160 MG tablet    No facility-administered encounter medications on file as of 12/07/2015.    PAST MEDICAL HISTORY:   Past Medical History  Diagnosis Date  . Diabetes mellitus without complication ([place])   . Bipolar 1 disorder ([place])   . Fibromyalgia   . COPD (chronic obstructive pulmonary disease) ([place])   . CHF (congestive heart failure) ([place])   . Asthma   . Atrial fibrillation ([place])   . Neuropathy ([facility])     Disc Back   . Fibromyalgia   . CHF (congestive heart failure) ([place])   . Coronary artery disease   . Anemia   . History of hiatal hernia   . GERD (gastroesophageal reflux disease)   . Shortness of breath dyspnea   . Family history of adverse reaction to anesthesia     Uncle was positive for malignant hyerthermia; patient had testing done and was negative.  . Myocardial infarction ([place]) 1990  . Sleep apnea     uses biPAP, 10  . Arthritis   . HOH (hard of hearing)     PAST SURGICAL HISTORY:   Past Surgical History  Procedure Laterality Date  . Heel  spur surgery Bilateral   . Appendectomy    . Bladder suspension      2003, 2006 and 2010  . Neck surgery N/A 2009    4, 6, and 7 cervical disc replaced  . Bladder stimulator      pt states,  "it cannot be turned off".  . Hernia repair      umbilical  . Cataract extraction w/phaco Right 11/29/2014    Procedure: CATARACT EXTRACTION PHACO AND INTRAOCULAR LENS PLACEMENT (IOC);  Surgeon: Rutherford Guys, MD;  Location: [facility];  Service: Ophthalmology;  Laterality: Right;  CDE:3.81  . Cataract extraction w/phaco Left 12/13/2014    Procedure: CATARACT EXTRACTION PHACO AND INTRAOCULAR LENS PLACEMENT (IOC);  Surgeon: Rutherford Guys, MD;  Location: [facility];  Service: Ophthalmology;  Laterality: Left;  CDE:6.59  . I&d extremity Right 06/13/2015    Procedure: MINOR IRRIGATION AND DEBRIDEMENT EXTREMITY REMOVAL OF NAIL;  Surgeon: Daryll Brod, MD;  Location: [facility];  Service: Orthopedics;  Laterality: Right;    SOCIAL HISTORY:   Social History   Social History  . Marital Status: Married    Spouse Name: N/A  . Number of Children: N/A  . Years of Education: N/A   Occupational History  . house wife    Social History Main Topics  . Smoking status: Former Smoker -- 2.00 packs/day for 14 years    Types: Cigarettes    Quit date: 04/12/1989  . Smokeless tobacco: Former Systems developer  . Alcohol Use: No  . Drug Use: No  . Sexual Activity: No   Other Topics Concern  . Not on file   Social History Narrative   Married   Disabled   1 child; 85 yrs   6 pregnancies   1 child       FAMILY HISTORY:   Family Status  Relation Status Death Age  . Mother Deceased 68    heart disease, COPD, breast cancer  . Father Deceased 33    heart disease  . Sister Deceased     61  . Maternal Grandmother Deceased 51  . Maternal Grandfather Deceased 73  . Paternal Grandmother Deceased   . Paternal Grandfather Deceased     ROS:  A complete 10 system review of systems was obtained and was unremarkable apart from what is mentioned above.  PHYSICAL EXAMINATION:    VITALS:   Filed Vitals:   12/07/15 1247  BP: 138/72  Pulse: 101  SpO2: 98%    GEN:  The patient appears stated age and is in  NAD.  She is very verbose and has difficulty staying focused HEENT:  Normocephalic, atraumatic.  The mucous membranes are moist. The superficial temporal arteries are without ropiness or tenderness. CV:  RRR Lungs:  CTAB.  She is wearing O2.   Neck/HEME:  There are no carotid bruits bilaterally. Skin:  She has a hole in the scalp that is now healed over in the R frontal region and has a small central red area.    Neurological examination:  Orientation: The patient is alert and oriented x3.  Cranial nerves: There is good facial symmetry.  The speech is fluent and clear.  She does have mild vocal tremor.   Soft palate rises symmetrically and there is no tongue deviation. Hearing is intact to conversational tone. Sensation: Sensation is intact to light touch throughout Motor: Strength is 5/5 in the bilateral upper and lower extremities.   Shoulder shrug is equal and symmetric.  There is no pronator drift.  Movement examination: Tone: There is normal tone in the bilateral upper extremities.  The tone in the lower extremities is normal.  Abnormal movements: There is virtually no tremor today and also no myoclonus Coordination:  There is no decremation with RAM's, with any form or rapid alternating movements, including alternating supination and pronation of the forearm, hand opening and closing, finger taps, heel taps and toe taps. Gait and Station: Not tested today  LABS  No results found for: TSH    Chemistry      Component Value Date/Time   NA 138 09/26/2015 1416   K 3.4* 09/26/2015 1416   CL 89* 09/26/2015 1416   CO2 38* 09/26/2015 1416   BUN 30* 09/26/2015 1416   CREATININE 1.28* 09/26/2015 1416      Component Value Date/Time   CALCIUM 9.2 09/26/2015 1416   ALKPHOS 83 09/26/2015 1416   AST 15 09/26/2015 1416   ALT 9 09/26/2015 1416   BILITOT 0.3 09/26/2015 1416     Lab Results  Component Value Date   WBC 12.9* 09/26/2015   HGB 10.3* 09/26/2015   HCT 31.6* 09/26/2015    MCV 79.7 09/26/2015   PLT 367.0 09/26/2015     ASSESSMENT/PLAN:  1.  Tremor  - She will continue on the currently primidone 50 mg daily.  It is well controlled.  Off of xarelto now so not concerned about that interaction.  Risks, benefits, side effects and alternative therapies were discussed.  The opportunity to ask questions was given and they were answered to the best of my ability.  The patient expressed understanding and willingness to follow the outlined treatment protocols.  2. Myoclonus  -Completely gone now that she is off of gabapentin.  3.  Peripheral neuropathy, diabetic  - Gabapentin caused myoclonus.  She has been on Lyrica in the past and insists it caused sores, and even if it didn't it is very similar to gabapentin and can contribute to myoclonus.  She had hallucinations with Cymbalta..  We could use one of the old tricyclics if we have to, but she didn't appear to be too bothered by this today.  Had significant difficulty focusing on topics today.  4.  Derm lesion  -needs to f/u with her dermatologist or with Dr. Martinique re: scalp lesion  5.  Follow up is anticipated in the next 6-8 months, sooner should new neurologic issues arise.  Much greater than 50% of this visit was spent in counseling with the patient.  Total face to face time:  20 min

## 2015-12-11 ENCOUNTER — Ambulatory Visit (HOSPITAL_COMMUNITY)
Admission: RE | Admit: 2015-12-11 | Discharge: 2015-12-11 | Disposition: A | Discharge status: Home / Self Care | Payer: Medicare Other | Source: Ambulatory Visit | Attending: Ophthalmology | Admitting: Ophthalmology

## 2015-12-11 DIAGNOSIS — H538 Other visual disturbances: Secondary | ICD-10-CM | POA: Diagnosis not present

## 2015-12-11 DIAGNOSIS — H35459 Secondary pigmentary degeneration, unspecified eye: Secondary | ICD-10-CM | POA: Diagnosis not present

## 2015-12-11 DIAGNOSIS — H53459 Other localized visual field defect, unspecified eye: Secondary | ICD-10-CM

## 2015-12-11 DIAGNOSIS — H468 Other optic neuritis: Secondary | ICD-10-CM | POA: Insufficient documentation

## 2015-12-12 DIAGNOSIS — D1801 Hemangioma of skin and subcutaneous tissue: Secondary | ICD-10-CM | POA: Diagnosis not present

## 2015-12-12 DIAGNOSIS — L821 Other seborrheic keratosis: Secondary | ICD-10-CM | POA: Diagnosis not present

## 2015-12-12 DIAGNOSIS — L98491 Non-pressure chronic ulcer of skin of other sites limited to breakdown of skin: Secondary | ICD-10-CM | POA: Diagnosis not present

## 2015-12-12 DIAGNOSIS — L218 Other seborrheic dermatitis: Secondary | ICD-10-CM | POA: Diagnosis not present

## 2015-12-14 ENCOUNTER — Telehealth: Payer: Self-pay | Admitting: Internal Medicine

## 2015-12-14 ENCOUNTER — Ambulatory Visit: Payer: Medicare Other | Admitting: Acute Care

## 2015-12-14 NOTE — Telephone Encounter (Signed)
Dr. Carlean Purl reviewed records and has accepted patient. Ok to schedule OV. We have left patient several messages to call our office to schedule this appointment. Records will be in "records reviewed" folder.

## 2015-12-20 ENCOUNTER — Encounter: Payer: Self-pay | Admitting: Internal Medicine

## 2015-12-21 DIAGNOSIS — Z79891 Long term (current) use of opiate analgesic: Secondary | ICD-10-CM | POA: Diagnosis not present

## 2015-12-21 DIAGNOSIS — G894 Chronic pain syndrome: Secondary | ICD-10-CM | POA: Diagnosis not present

## 2015-12-27 DIAGNOSIS — H53459 Other localized visual field defect, unspecified eye: Secondary | ICD-10-CM | POA: Diagnosis not present

## 2015-12-27 DIAGNOSIS — H468 Other optic neuritis: Secondary | ICD-10-CM | POA: Diagnosis not present

## 2015-12-27 DIAGNOSIS — H53419 Scotoma involving central area, unspecified eye: Secondary | ICD-10-CM | POA: Diagnosis not present

## 2015-12-27 DIAGNOSIS — E119 Type 2 diabetes mellitus without complications: Secondary | ICD-10-CM | POA: Diagnosis not present

## 2015-12-27 DIAGNOSIS — H53483 Generalized contraction of visual field, bilateral: Secondary | ICD-10-CM | POA: Diagnosis not present

## 2015-12-28 DIAGNOSIS — F329 Major depressive disorder, single episode, unspecified: Secondary | ICD-10-CM | POA: Diagnosis not present

## 2015-12-29 DIAGNOSIS — S62102S Fracture of unspecified carpal bone, left wrist, sequela: Secondary | ICD-10-CM | POA: Diagnosis not present

## 2015-12-29 DIAGNOSIS — S60222A Contusion of left hand, initial encounter: Secondary | ICD-10-CM | POA: Diagnosis not present

## 2016-01-03 ENCOUNTER — Ambulatory Visit: Payer: Medicare Other | Admitting: Acute Care

## 2016-01-03 DIAGNOSIS — S63502A Unspecified sprain of left wrist, initial encounter: Secondary | ICD-10-CM | POA: Diagnosis not present

## 2016-01-03 DIAGNOSIS — M25532 Pain in left wrist: Secondary | ICD-10-CM | POA: Diagnosis not present

## 2016-01-09 ENCOUNTER — Telehealth: Payer: Self-pay | Admitting: Pulmonary Disease

## 2016-01-09 DIAGNOSIS — J309 Allergic rhinitis, unspecified: Secondary | ICD-10-CM

## 2016-01-09 NOTE — Telephone Encounter (Signed)
Lm x 1 

## 2016-01-10 DIAGNOSIS — I739 Peripheral vascular disease, unspecified: Secondary | ICD-10-CM | POA: Diagnosis not present

## 2016-01-10 DIAGNOSIS — L603 Nail dystrophy: Secondary | ICD-10-CM | POA: Diagnosis not present

## 2016-01-10 DIAGNOSIS — E1151 Type 2 diabetes mellitus with diabetic peripheral angiopathy without gangrene: Secondary | ICD-10-CM | POA: Diagnosis not present

## 2016-01-10 MED ORDER — MODAFINIL 200 MG PO TABS: 200.0000 mg | ORAL_TABLET | Freq: Every day | ORAL | 0 refills | Status: DC

## 2016-01-10 MED ORDER — MONTELUKAST SODIUM 10 MG PO TABS: 10.0000 mg | ORAL_TABLET | Freq: Every day | ORAL | 3 refills | Status: DC

## 2016-01-10 NOTE — Telephone Encounter (Signed)
Pt is requesting a 90 day supply of the provigil and the singulair be sent to her mail order pharmacy.  VS is this ok?  Pt was last seen by VS 07/2015.  thanks

## 2016-01-10 NOTE — Telephone Encounter (Signed)
Okay to send 90 day supply order for provigil and singulair.

## 2016-01-10 NOTE — Telephone Encounter (Signed)
Rx printed for Provigil and sent for Singulair. VS will be in office tomorrow to sign Provigil and it can be faxed to [facility]. Rx placed in his box.   LMTCB

## 2016-01-11 NOTE — Telephone Encounter (Signed)
Rx has been returned to triage and has been faxed in. lmtcb x2 for pt.

## 2016-01-12 NOTE — Telephone Encounter (Signed)
Called and spoke with pt and she is aware that these meds have been faxed to the pharmacy.

## 2016-01-16 DIAGNOSIS — H469 Unspecified optic neuritis: Secondary | ICD-10-CM | POA: Diagnosis not present

## 2016-01-16 LAB — HM DIABETES EYE EXAM

## 2016-01-18 DIAGNOSIS — M5432 Sciatica, left side: Secondary | ICD-10-CM | POA: Diagnosis not present

## 2016-01-18 DIAGNOSIS — M5431 Sciatica, right side: Secondary | ICD-10-CM | POA: Diagnosis not present

## 2016-01-18 DIAGNOSIS — G8929 Other chronic pain: Secondary | ICD-10-CM | POA: Diagnosis not present

## 2016-01-18 DIAGNOSIS — M545 Low back pain: Secondary | ICD-10-CM | POA: Diagnosis not present

## 2016-01-18 DIAGNOSIS — Z79891 Long term (current) use of opiate analgesic: Secondary | ICD-10-CM | POA: Diagnosis not present

## 2016-01-18 DIAGNOSIS — G894 Chronic pain syndrome: Secondary | ICD-10-CM | POA: Diagnosis not present

## 2016-01-26 ENCOUNTER — Ambulatory Visit (INDEPENDENT_AMBULATORY_CARE_PROVIDER_SITE_OTHER): Payer: Medicare Other | Admitting: Family Medicine

## 2016-01-26 ENCOUNTER — Encounter: Payer: Self-pay | Admitting: Family Medicine

## 2016-01-26 VITALS — BP 130/90 | HR 83 | Ht 59.0 in

## 2016-01-26 DIAGNOSIS — E559 Vitamin D deficiency, unspecified: Secondary | ICD-10-CM

## 2016-01-26 DIAGNOSIS — K219 Gastro-esophageal reflux disease without esophagitis: Secondary | ICD-10-CM

## 2016-01-26 DIAGNOSIS — G629 Polyneuropathy, unspecified: Secondary | ICD-10-CM | POA: Insufficient documentation

## 2016-01-26 DIAGNOSIS — E876 Hypokalemia: Secondary | ICD-10-CM | POA: Diagnosis not present

## 2016-01-26 DIAGNOSIS — D509 Iron deficiency anemia, unspecified: Secondary | ICD-10-CM

## 2016-01-26 DIAGNOSIS — L989 Disorder of the skin and subcutaneous tissue, unspecified: Secondary | ICD-10-CM

## 2016-01-26 LAB — CBC
HCT: 32.5 % — ABNORMAL LOW (ref 36.0–46.0)
HEMOGLOBIN: 10.9 g/dL — AB (ref 12.0–15.0)
MCHC: 33.5 g/dL (ref 30.0–36.0)
MCV: 81.7 fl (ref 78.0–100.0)
PLATELETS: 313 10*3/uL (ref 150.0–400.0)
RBC: 3.98 Mil/uL (ref 3.87–5.11)
RDW: 13.7 % (ref 11.5–15.5)
WBC: 11.5 10*3/uL — AB (ref 4.0–10.5)

## 2016-01-26 LAB — BASIC METABOLIC PANEL
BUN: 27 mg/dL — ABNORMAL HIGH (ref 6–23)
CHLORIDE: 92 meq/L — AB (ref 96–112)
CO2: 38 meq/L — AB (ref 19–32)
CREATININE: 1.19 mg/dL (ref 0.40–1.20)
Calcium: 8.9 mg/dL (ref 8.4–10.5)
GFR: 48.41 mL/min — ABNORMAL LOW (ref >60.00)
GLUCOSE: 224 mg/dL — AB (ref 70–99)
Potassium: 3.6 mEq/L (ref 3.5–5.1)
Sodium: 140 mEq/L (ref 135–145)

## 2016-01-26 LAB — VITAMIN D 25 HYDROXY (VIT D DEFICIENCY, FRACTURES): VITD: 29.71 ng/mL — AB (ref 30.00–100.00)

## 2016-01-26 LAB — FERRITIN: Ferritin: 43.8 ng/mL (ref 10.0–291.0)

## 2016-01-26 MED ORDER — ATORVASTATIN CALCIUM 20 MG PO TABS: 20.0000 mg | ORAL_TABLET | Freq: Every day | ORAL | 3 refills | Status: DC

## 2016-01-26 MED ORDER — MUPIROCIN CALCIUM 2 % EX CREA: 1.0000 "application " | TOPICAL_CREAM | Freq: Two times a day (BID) | CUTANEOUS | 0 refills | Status: DC

## 2016-01-26 MED ORDER — POTASSIUM CHLORIDE CRYS ER 20 MEQ PO TBCR: 20.0000 meq | EXTENDED_RELEASE_TABLET | Freq: Three times a day (TID) | ORAL | 0 refills | Status: DC

## 2016-01-26 MED ORDER — TRIAMCINOLONE ACETONIDE 0.1 % EX LOTN: 1.0000 "application " | TOPICAL_LOTION | Freq: Three times a day (TID) | CUTANEOUS | 1 refills | Status: DC

## 2016-01-26 MED ORDER — PANTOPRAZOLE SODIUM 20 MG PO TBEC: 20.0000 mg | DELAYED_RELEASE_TABLET | Freq: Two times a day (BID) | ORAL | 1 refills | Status: DC

## 2016-01-26 NOTE — Progress Notes (Signed)
HPI:   Denise Freeman is a 65 y.o. female, who is here today to follow on some of her chronic medical problems and on last OV, 09/26/15,when her GERD medication was adjusted.  Established care with me on 09/26/15, at that time she was c/o arthralgias, knees mainly. Hx of OA and fibromyalgia, she follows with pain management. She uses a walker at home, reporting frequent falls, last one about 6 weeks ago.  Most of the time she is on her scooter because walking exacerbates knee pain and feels unstable, knees gave up. According to pt, she was told falls were related to neuropathy and OA.  Recently she followed with neurologists, Dr Tat, tremor/movement disorder evaluation; which she has had for a couple of years. She is on Primidone 50 mg daily.  GERD: She is on Protonix 40 mg (20 mg bid), it was increased from 20 mg last OV because reporting acid reflux with occasional vomiting. Symptoms better controlled. Hx of upper GI bleed, has appt with GI already arranged.    Anemia, H/H 10.3/31.6 in 09/2015.  Denies changes in chronic abdominal pain, nausea, vomiting, changes in bowel habits, or red blood in stool.   -CKD III and vit D deficiency, she is on Ergocalciferol 50,000 U weekly. No gross hematuria or foam in urine. Hx of hypoK+, she is on KCL 20 meq tid. On diuretic treatment, Hx of CHF and atrial fib, she is on Furosemide and Zaroxolyn 2.5 gm 3 times per week.  DM II , currently she follows with endocrinologists, Dr Cruzita Lederer, last seen 11/28/15.  OSA on Bipap and COPD, she is on supplemental O2 3 LPM,  follows with pulmonologist, Dr Halford Chessman.    Concerns today: scalp lesions, meds refill  Today she is concerned about skin lesions on scalp. According to pt, she follows with dermatologists and this problems was addressed. She reports having one of these lesions removed and sent to pathology, negative for malignancy, 6-7 months ago. 4 months ago new lesions appeared,  pruritic and now tender, also already evaluated by her dermatologists and according to pt, no further recommendations given, denies picking on lesions but irritates it with combing.     Hyperlipidemia:  Currently on Fenofibrate 54 mg and Lipitor 20 mg daily. Following a low fat diet: yes.  She has not noted side effects with medication.  She has not had FLP in over a year, today she is not fasting.   GERD:   Lab Results  Component Value Date   CREATININE 1.28 (H) 09/26/2015   BUN 30 (H) 09/26/2015   NA 138 09/26/2015   K 3.4 (L) 09/26/2015   CL 89 (L) 09/26/2015   CO2 38 (H) 09/26/2015      Review of Systems  Constitutional: Positive for fatigue (no more than usual). Negative for activity change, appetite change, fever and unexpected weight change.  HENT: Negative for facial swelling, mouth sores, nosebleeds, sore throat and trouble swallowing.   Eyes: Negative for pain, redness and visual disturbance.  Respiratory: Positive for apnea (On BiPaP) and shortness of breath (exertional/stable). Negative for cough and wheezing.   Cardiovascular: Negative for chest pain, palpitations and leg swelling.  Gastrointestinal: Positive for abdominal pain (chronic/stable). Negative for blood in stool, nausea and vomiting.       No changes in bowel habits.  Genitourinary: Negative for decreased urine volume, difficulty urinating and hematuria.       Negative for urine incontinence.  Musculoskeletal: Positive for arthralgias, back  pain, joint swelling, myalgias and neck pain.  Skin: Positive for rash. Negative for wound.          Allergic/Immunologic: Positive for environmental allergies.  Neurological: Positive for tremors (controlled) and numbness. Negative for syncope, weakness and headaches.  Psychiatric/Behavioral: Positive for sleep disturbance. Negative for confusion and hallucinations. The patient is nervous/anxious.       Current Outpatient Prescriptions on File Prior to  Visit  Medication Sig Dispense Refill  . albuterol (PROVENTIL) (2.5 MG/3ML) 0.083% nebulizer solution Take 3 mLs (2.5 mg total) by nebulization every 6 (six) hours as needed for wheezing or shortness of breath. 1080 mL 0  . budesonide (PULMICORT) 0.5 MG/2ML nebulizer solution USE 2 ML (0.5 MG TOTAL) VIA NEBULIZER TWICE A DAY 360 mL 3  . diltiazem (CARDIZEM) 30 MG tablet Take 1 tablet (30 mg total) by mouth 2 (two) times daily. 180 tablet 3  . docusate sodium (COLACE) 100 MG capsule Take 100 mg by mouth daily.    . ergocalciferol (VITAMIN D2) 50000 units capsule Take 1 capsule (50,000 Units total) by mouth once a week. For 16 weeks 16 capsule 1  . escitalopram (LEXAPRO) 10 MG tablet Take 10 mg by mouth at bedtime.     . fenofibrate 54 MG tablet Take 1 tablet (54 mg total) by mouth daily. 90 tablet 1  . fluticasone (FLONASE) 50 MCG/ACT nasal spray Place 1 spray into both nostrils daily as needed for allergies or rhinitis. 32 g 1  . furosemide (LASIX) 40 MG tablet Take 2 tablets (80 mg total) by mouth 2 (two) times daily. 360 tablet 3  . Insulin Glargine (TOUJEO SOLOSTAR) 300 UNIT/ML SOPN Inject 80 Units into the skin at bedtime. 18 pen 1  . insulin lispro (HUMALOG KWIKPEN) 100 UNIT/ML KiwkPen Inject 25-35 units under skin 3x a day 35 pen 1  . Insulin Pen Needle 32G X 4 MM MISC Use to inject insulin 4 times daily. 400 each 1  . ipratropium-albuterol (DUONEB) 0.5-2.5 (3) MG/3ML SOLN Take 3 mLs by nebulization 4 (four) times daily. 1080 mL 1  . metolazone (ZAROXOLYN) 2.5 MG tablet Take 1 tablet (2.5 mg total) by mouth daily. 90 tablet 3  . mirtazapine (REMERON) 15 MG tablet Take 15 mg by mouth at bedtime.    . modafinil (PROVIGIL) 200 MG tablet Take 1 tablet (200 mg total) by mouth daily. 90 tablet 0  . montelukast (SINGULAIR) 10 MG tablet Take 1 tablet (10 mg total) by mouth at bedtime. 90 tablet 3  . nitroGLYCERIN (NITROSTAT) 0.4 MG SL tablet Place 1 tablet (0.4 mg total) under the tongue every 5  (five) minutes as needed for chest pain. 25 tablet 3  . nystatin (MYCOSTATIN/NYSTOP) 100000 UNIT/GM POWD Apply topically 2 (two) times daily. Apply under breasts    . ondansetron (ZOFRAN-ODT) 4 MG disintegrating tablet Take 1 tablet (4 mg total) by mouth 2 (two) times daily as needed for nausea or vomiting. 30 tablet 1  . oxyCODONE (OXY IR/ROXICODONE) 5 MG immediate release tablet Take 5 mg by mouth 2 (two) times daily.    . OXYGEN Inhale 3 L into the lungs continuous. 3 liters 24/7    . PRESCRIPTION MEDICATION Inhale into the lungs See admin instructions. Use BIPAP every time laying down    . primidone (MYSOLINE) 50 MG tablet Take 1 tablet (50 mg total) by mouth at bedtime. 90 tablet 1  . Probiotic Product (PROBIOTIC PO) Take 1 tablet by mouth at bedtime.    . Vitamins  A & D (VITAMIN A & D PO) Take by mouth.     No current facility-administered medications on file prior to visit.      Past Medical History:  Diagnosis Date  . Anemia   . Arthritis   . Asthma   . Atrial fibrillation ([place])   . Bipolar 1 disorder ([place])   . CHF (congestive heart failure) ([place])   . COPD (chronic obstructive pulmonary disease) ([place])   . Coronary artery disease   . Diabetes mellitus without complication ([place])   . Family history of adverse reaction to anesthesia    Uncle was positive for malignant hyerthermia; patient had testing done and was negative.  . Fibromyalgia   . Fibromyalgia   . GERD (gastroesophageal reflux disease)   . History of hiatal hernia   . HOH (hard of hearing)   . Myocardial infarction ([place]) 1990  . 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 Oxygen deficiency    3 LPM  . Shortness of breath dyspnea   . Sleep apnea    uses biPAP, 10   Allergies  Allergen Reactions  . Ancef [Cefazolin] Nausea And Vomiting  . Augmentin [Amoxicillin-Pot Clavulanate] Itching  . Ciprofloxacin Itching  . Haldol [Haloperidol] Other (See Comments)    Restless leg  . Levaquin [Levofloxacin In D5w] Other (See  Comments)    "afib"  . Lyrica [Pregabalin] Hives  . Nsaids Diarrhea  . Tamiflu [Oseltamivir Phosphate] Other (See Comments)    "water blisters"  . Topiramate Nausea Only  . Zoloft [Sertraline Hcl] Other (See Comments)    Jaw problems, jittery  . Penicillins Itching, Nausea And Vomiting and Rash    Has patient had a PCN reaction causing immediate rash, facial/tongue/throat swelling, SOB or lightheadedness with hypotension: Yes Has patient had a PCN reaction causing severe rash involving mucus membranes or skin necrosis: No Has patient had a PCN reaction that required hospitalization No Has patient had a PCN reaction occurring within the last 10 years: Yes If all of the above answers are "NO", then may proceed with Cephalosporin use.    Social History   Social History  . Marital status: Married    Spouse name: N/A  . Number of children: N/A  . Years of education: N/A   Occupational History  . house wife    Social History Main Topics  . Smoking status: Former Smoker    Packs/day: 2.00    Years: 14.00    Types: Cigarettes    Quit date: 04/12/1989  . Smokeless tobacco: Former Systems developer  . Alcohol use No  . Drug use: No  . Sexual activity: No   Other Topics Concern  . None   Social History Narrative   Married   Disabled   1 child; 53 yrs   6 pregnancies   1 child       Vitals:   01/26/16 0948  BP: 130/90  Pulse: 83   There is no height or weight on file to calculate BMI.      Physical Exam  Nursing note and vitals reviewed. Constitutional: She is oriented to person, place, and time. She appears well-developed. No distress.  HENT:  Head: Atraumatic.  Mouth/Throat: Oropharynx is clear and moist. Mucous membranes are dry.  edentulous   Eyes: Conjunctivae and EOM are normal. Pupils are equal, round, and reactive to light.  Neck: No thyromegaly present.  Cardiovascular: Normal rate.  An irregular rhythm present.  No murmur heard. Pulses:  Dorsalis pedis  pulses are 2+ on the right side, and 2+ on the left side.  Respiratory: Effort normal and breath sounds normal. No respiratory distress.  GI: Soft. She exhibits no mass. There is no tenderness.  Musculoskeletal: She exhibits no edema.  Lymphadenopathy:    She has no cervical adenopathy.  Neurological: She is alert and oriented to person, place, and time. She has normal strength. Coordination normal.  In a scooter.  Skin: Skin is warm. Lesion noted. No erythema.  On right parietal scalp rounded crusty/res lesions, regular borders, no tender, no local heat or induration. # 2, each one about 4-5 mm.  Psychiatric: She has a normal mood and affect.  Well groomed, good eye contact.      ASSESSMENT AND PLAN:     Zowie was seen today for new patient (initial visit).  Diagnoses and all orders for this visit:  Gastroesophageal reflux disease, esophagitis presence not specified  Improved. No changes in current management. Keep appt with GI. Instructed about warning signs. F/U in 12 months.  -     pantoprazole (PROTONIX) 20 MG tablet; Take 1 tablet (20 mg total) by mouth 2 (two) times daily before a meal.  Microcytic anemia  Chronic disease , ? Iron deficiency. Further recommendations will be given according to lab result. Keep appt with GI.  -     CBC -     Ferritin -     Basic Metabolic Panel  Skin lesion of scalp  Lesions do not look infected but rather irritated by manipulation. She has short hair, recommended avoiding combing hair around affected area, do not scratch. Topical abx x 7 days. Topical steroid treatment, some side effects discussed. F/U as needed.  -     mupirocin cream (BACTROBAN) 2 %; Apply 1 application topically 2 (two) times daily. -     triamcinolone lotion (KENALOG) 0.1 %; Apply 1 application topically 3 (three) times daily. For up to 14 days and as needed  Vitamin D deficiency  No changes in current management, will follow labs done today and will  give further recommendations accordingly.  -     VITAMIN D 25 Hydroxy (Vit-D Deficiency, Fractures)   Peripheral polyneuropathy ([place])  Foot care discussed. She has not tolerated Cymbalta in the past, Gabapentin caused myotonic movement disorder, and Lyrica also caused side effects. Fall prevention. Good diabetes controlled.  Hypokalemia  No changes in current management, will follow labs done today and will give further recommendations accordingly.  -     potassium chloride SA (K-DUR,KLOR-CON) 20 MEQ tablet; Take 1 tablet (20 mEq total) by mouth 3 (three) times daily.  Other orders -     atorvastatin (LIPITOR) 20 MG tablet; Take 1 tablet (20 mg total) by mouth daily.     Next OV I am planning on fasting labs, instructed to come fasting. She will continue following with psychiatrists, neurologists, pulmonologists, pain manager, and dermatologists for some of her medical problems.        -Ms. Rashaan Horsfield Bramhall was advised to return sooner than planned today if new concerns arise.       Betty G. Martinique, MD  [facility]. [place] office.

## 2016-01-26 NOTE — Patient Instructions (Addendum)
A few things to remember from today's visit:   Vitamin D deficiency - Plan: VITAMIN D 25 Hydroxy (Vit-D Deficiency, Fractures)  Microcytic anemia - Plan: CBC, Ferritin, Basic Metabolic Panel  Chronic diastolic congestive heart failure (HCC)  Gastroesophageal reflux disease, esophagitis presence not specified - Plan: pantoprazole (PROTONIX) 20 MG tablet  Hypokalemia - Plan: potassium chloride SA (K-DUR,KLOR-CON) 20 MEQ tablet  Peripheral polyneuropathy (HCC)  Skin lesion of scalp - Plan: mupirocin cream (BACTROBAN) 2 %, triamcinolone lotion (KENALOG) 0.1 %   We need to do cholesterol next time. Low fat diet, little red meat or avoid, no changes for now but will consider stopping Fenofibrate.   We have ordered labs or studies at this visit.  It can take up to 1-2 weeks for results and processing. IF results require follow up or explanation, we will call you with instructions. Clinically stable results will be released to your [facility]. If you have not heard from [place] or cannot find your results in [facility] in 2 weeks please contact our office at [phone].  If you are not yet signed up for [facility], please consider signing up  Please be sure medication list is accurate. If a new problem present, please set up appointment sooner than planned today.     Medicare covers a annual preventive visit, which is strongly recommended , it is once per year and involves a series of questions to identify risk factors; so we can try to prevent possible complications. This does not need to be done by a doctor.  We have a nurse Investment banker, corporate) here that is highly qualified to do it, it can be arrange same date you have a follow up appointment with me or labs scheduled, and it 100% covered by Medicare. So before you leave today I would like for you to arrange visit with Ms Denise Freeman for Medicare wellness visit.

## 2016-01-26 NOTE — Progress Notes (Signed)
Pre visit review using our clinic review tool, if applicable. No additional management support is needed unless otherwise documented below in the visit note. 

## 2016-01-26 NOTE — Progress Notes (Signed)
Denise Freeman was seen today in the movement disorders clinic for neurologic consultation at the request of Betty Martinique, MD.  The consultation is for the evaluation of tremor.  The records that were made available to me were reviewed.  Pt does have a hx of bipolar d/o but is not on medication specifically for this.  Med hx review does not reveal any exposure to antipsychotics.    Pt reports that tremor 3 years ago, but has gotten worse.  It started in both hands and has continued in both hands.  No leg tremor.  She is on albuterol/ipratropium nebulizer.  She uses the albuterol nebulizer form 4 times per day.  She estimates that she has been on that for a year.  She doesn't think that the nebulizer affects her tremor.  There is a fam hx of tremor in her mother.  02/28/15 update:  The patient is following up today. I reviewed her PCP records.   When I saw her in June, she was describing myoclonus and I suspected that was from gabapentin, but I also told her that I did not recommend gabapentin and Lyrica together and she was on both.  She really did not want to alter the medications, but tells me today that she did get off of the Lyrica in early August.  Despite that, she continues to have some of these movements.   She is still on gabapentin but thinks that she is on it twice per day.  She is unsure (called the pharmacy later and it appears that she is on 300 mg 3 times per day, down from 900 mg 3 times per day).  She reports that she has been on cymbalta previously for neuropathy and it caused hallucinations.  She did have some recent lab work done and her hemoglobin A1c was very elevated at 11.7 in August and in April it was 8.3.  She has both tremor and myoclonus and previously reported that she was not bothered by tremor, but now states that she is.  She asks me if there is some medication that could help both her neuropathy as well as her tremor, meanwhile asking me if she could get off of the  gabapentin.  03/23/15 update:  The patient is seen today in follow-up.  Last visit, I started her on Topamax.  She got up to 75 mg daily and stated that it caused itchiness and GI upset.    While I told her this would be a very unusual side effect, she felt pretty convinced that it was the medication.  She discontinued it 2 days ago.  Strangely, it did work well.  She admits that the tremor is starting to come back.  She admits that she still has GI upset despite going off of the topamax.  Only ate a banana today and that barely feels settled.  Diabetes is more out of control and getting some blood sugar readings as high as 600. Now that she is off of the gabapentin, the myoclonus went away completely.    06/08/15 update:  The patient is following up today.  I have reviewed records available to me since last visit, both from pulmonary as well as from cardiology.  The patient has a history of essential tremor, which has been worsened by nebulizer use.  We cautiously started her on primidone last visit, knowing that it could have a potential interaction with Xarelto.  The patient understood the implications of this.  She states that she is doing well in terms of tremor of tremor unless she gets upset and then it "gets aggravating."  She also has a history of peripheral neuropathy, but has had myoclonus with gabapentin in the past.  When asked about this, she has trouble focusing and tells me about her knee pain.  12/07/15 update:  The patient follows up today.  I have reviewed extensive records since last visit.  She is on primidone, 50 mg daily for tremor. States that it is well controlled with the primidone.   I told her in the past I did not want to increase this because she was on Xarelto and there is an interaction.  A few days after I saw her, she had an incision and drainage for thumb paranychia.  She ended right back up in the hospital on January 21 because of GI bleed.  She has not been on Xarelto ever  since.  She was transfused 3 units.  It appears that perhaps she sought GI in [facility], but has otherwise had some difficulty getting GI follow-up ever since then.  [facility] refused to see the patient.  Dr. Carlean Purl saw her in the hospital but she states that he won't see her again until records received from [facility].  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  She went to [facility] and got antibiotics for it.    01/29/16 update:  The patient follows up today.  She is doing well in regards to tremor.  She continues on primidone, 50 mg daily.  Since our last visit, she went to [facility] to the eye clinic and saw neuroophthalmology.  I reviewed Dr. Deeann Dowse records and Dr. Zadie Rhine records.  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  The patient cannot have an MRI of the brain because of implants in the hip.  She had a CT of the brain on 12/11/15 that I had the opportunity to review.  This was done without contrast.  This was unremarkable.  Pt states that she had beginning of vision issues when "I lost blood from the GI issues in Jan."  Reports that she cannot see peripherally and centrally she just has floaters.  Interestingly, she really didn't report this when I saw her last visit.  States that eyes have become very sensitive to light and is now wearing darkened glasses.  ALLERGIES:   Allergies  Allergen Reactions  . Ancef [Cefazolin] Nausea And Vomiting  . Augmentin [Amoxicillin-Pot Clavulanate] Itching  . Ciprofloxacin Itching  . Haldol [Haloperidol] Other (See Comments)    Restless leg  . Levaquin [Levofloxacin In D5w] Other (See Comments)    "afib"  . Lyrica [Pregabalin] Hives  . Nsaids Diarrhea  . Tamiflu [Oseltamivir Phosphate] Other (See Comments)    "water blisters"  . Topiramate Nausea Only  . Zoloft [Sertraline Hcl] Other (See Comments)    Jaw problems, jittery  . Penicillins Itching,  Nausea And Vomiting and Rash    Has patient had a PCN reaction causing immediate rash, facial/tongue/throat swelling, SOB or lightheadedness with hypotension: Yes Has patient had a PCN reaction causing severe rash involving mucus membranes or skin necrosis: No Has patient had a PCN reaction that required hospitalization No Has patient had a PCN reaction occurring within the last 10 years: Yes If all of the above answers are "NO", then may  proceed with Cephalosporin use.    CURRENT MEDICATIONS:  Outpatient Encounter Prescriptions as of 01/29/2016  Medication Sig  . albuterol (PROVENTIL) (2.5 MG/3ML) 0.083% nebulizer solution Take 3 mLs (2.5 mg total) by nebulization every 6 (six) hours as needed for wheezing or shortness of breath.  Marland Kitchen atorvastatin (LIPITOR) 20 MG tablet Take 1 tablet (20 mg total) by mouth daily.  . budesonide (PULMICORT) 0.5 MG/2ML nebulizer solution USE 2 ML (0.5 MG TOTAL) VIA NEBULIZER TWICE A DAY  . diltiazem (CARDIZEM) 30 MG tablet Take 1 tablet (30 mg total) by mouth 2 (two) times daily.  Marland Kitchen docusate sodium (COLACE) 100 MG capsule Take 100 mg by mouth daily.  . ergocalciferol (VITAMIN D2) 50000 units capsule Take 1 capsule (50,000 Units total) by mouth once a week. For 16 weeks  . escitalopram (LEXAPRO) 10 MG tablet Take 10 mg by mouth at bedtime.   . fenofibrate 54 MG tablet Take 1 tablet (54 mg total) by mouth daily.  . fluticasone (FLONASE) 50 MCG/ACT nasal spray Place 1 spray into both nostrils daily as needed for allergies or rhinitis.  . furosemide (LASIX) 40 MG tablet Take 2 tablets (80 mg total) by mouth 2 (two) times daily.  . Insulin Glargine (TOUJEO SOLOSTAR) 300 UNIT/ML SOPN Inject 80 Units into the skin at bedtime.  . insulin lispro (HUMALOG KWIKPEN) 100 UNIT/ML KiwkPen Inject 25-35 units under skin 3x a day  . Insulin Pen Needle 32G X 4 MM MISC Use to inject insulin 4 times daily.  Marland Kitchen ipratropium-albuterol (DUONEB) 0.5-2.5 (3) MG/3ML SOLN Take 3 mLs by  nebulization 4 (four) times daily.  . metolazone (ZAROXOLYN) 2.5 MG tablet Take 1 tablet (2.5 mg total) by mouth daily.  . mirtazapine (REMERON) 15 MG tablet Take 15 mg by mouth at bedtime.  . modafinil (PROVIGIL) 200 MG tablet Take 1 tablet (200 mg total) by mouth daily.  . montelukast (SINGULAIR) 10 MG tablet Take 1 tablet (10 mg total) by mouth at bedtime.  . mupirocin cream (BACTROBAN) 2 % Apply 1 application topically 2 (two) times daily.  . nitroGLYCERIN (NITROSTAT) 0.4 MG SL tablet Place 1 tablet (0.4 mg total) under the tongue every 5 (five) minutes as needed for chest pain.  Marland Kitchen nystatin (MYCOSTATIN/NYSTOP) 100000 UNIT/GM POWD Apply topically 2 (two) times daily. Apply under breasts  . ondansetron (ZOFRAN-ODT) 4 MG disintegrating tablet Take 1 tablet (4 mg total) by mouth 2 (two) times daily as needed for nausea or vomiting.  Marland Kitchen oxyCODONE (OXY IR/ROXICODONE) 5 MG immediate release tablet Take 5 mg by mouth 2 (two) times daily.  . OXYGEN Inhale 3 L into the lungs continuous. 3 liters 24/7  . pantoprazole (PROTONIX) 20 MG tablet Take 1 tablet (20 mg total) by mouth 2 (two) times daily before a meal.  . potassium chloride SA (K-DUR,KLOR-CON) 20 MEQ tablet Take 1 tablet (20 mEq total) by mouth 3 (three) times daily.  Marland Kitchen PRESCRIPTION MEDICATION Inhale into the lungs See admin instructions. Use BIPAP every time laying down  . primidone (MYSOLINE) 50 MG tablet Take 1 tablet (50 mg total) by mouth at bedtime.  . Probiotic Product (PROBIOTIC PO) Take 1 tablet by mouth at bedtime.  . triamcinolone lotion (KENALOG) 0.1 % Apply 1 application topically 3 (three) times daily. For up to 14 days and as needed  . Vitamins A & D (VITAMIN A & D PO) Take by mouth.   No facility-administered encounter medications on file as of 01/29/2016.     PAST MEDICAL HISTORY:  Past Medical History:  Diagnosis Date  . Anemia   . Arthritis   . Asthma   . Atrial fibrillation ([place])   . Bipolar 1 disorder ([place])   . CHF  (congestive heart failure) ([place])   . COPD (chronic obstructive pulmonary disease) ([place])   . Coronary artery disease   . Diabetes mellitus without complication ([place])   . Family history of adverse reaction to anesthesia    Uncle was positive for malignant hyerthermia; patient had testing done and was negative.  . Fibromyalgia   . Fibromyalgia   . GERD (gastroesophageal reflux disease)   . History of hiatal hernia   . HOH (hard of hearing)   . Myocardial infarction ([place]) 1990  . 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 Oxygen deficiency    3 LPM  . Shortness of breath dyspnea   . Sleep apnea    uses biPAP, 10    PAST SURGICAL HISTORY:   Past Surgical History:  Procedure Laterality Date  . APPENDECTOMY    . bladder stimulator     pt states, "it cannot be turned off".  Marland Kitchen BLADDER SUSPENSION     2003, 2006 and 2010  . CATARACT EXTRACTION W/PHACO Right 11/29/2014   Procedure: CATARACT EXTRACTION PHACO AND INTRAOCULAR LENS PLACEMENT (IOC);  Surgeon: Rutherford Guys, MD;  Location: [facility];  Service: Ophthalmology;  Laterality: Right;  CDE:3.81  . CATARACT EXTRACTION W/PHACO Left 12/13/2014   Procedure: CATARACT EXTRACTION PHACO AND INTRAOCULAR LENS PLACEMENT (IOC);  Surgeon: Rutherford Guys, MD;  Location: [facility];  Service: Ophthalmology;  Laterality: Left;  CDE:6.59  . HEEL SPUR SURGERY Bilateral   . HERNIA REPAIR     umbilical  . I&D EXTREMITY Right 06/13/2015   Procedure: MINOR IRRIGATION AND DEBRIDEMENT EXTREMITY REMOVAL OF NAIL;  Surgeon: Daryll Brod, MD;  Location: [facility];  Service: Orthopedics;  Laterality: Right;  . NECK SURGERY N/A 2009   4, 6, and 7 cervical disc replaced    SOCIAL HISTORY:   Social History   Social History  . Marital status: Married    Spouse name: N/A  . Number of children: N/A  . Years of education: N/A   Occupational History  . house wife    Social History Main Topics  . Smoking status: Former Smoker    Packs/day: 2.00    Years: 14.00     Types: Cigarettes    Quit date: 04/12/1989  . Smokeless tobacco: Former Systems developer  . Alcohol use No  . Drug use: No  . Sexual activity: No   Other Topics Concern  . Not on file   Social History Narrative   Married   Disabled   1 child; 11 yrs   6 pregnancies   1 child       FAMILY HISTORY:   Family Status  Relation Status  . Mother Deceased at age 30   heart disease, COPD, breast cancer  . Father Deceased at age 60   heart disease  . Sister Deceased   56  . Maternal Grandmother Deceased at age 25  . Maternal Grandfather Deceased at age 37  . Paternal Grandmother Deceased  . Paternal Grandfather Deceased    ROS:  A complete 10 system review of systems was obtained and was unremarkable apart from what is mentioned above.  PHYSICAL EXAMINATION:    VITALS:   Vitals:   01/29/16 1049  BP: 104/70  Pulse: 93    GEN:  The patient appears  stated age and is in NAD.   HEENT:  Normocephalic, atraumatic.  The mucous membranes are moist. The superficial temporal arteries are without ropiness or tenderness. CV:  RRR Lungs:  CTAB.  She is wearing O2.   Neck/HEME:  There are no carotid bruits bilaterally.   Neurological examination:  Orientation: The patient is alert and oriented x3.  Cranial nerves: There is good facial symmetry. Pupils are 5-6 mm and min reactive.  Unable to count fingers.   The speech is fluent and clear.  She does have mild vocal tremor.   Soft palate rises symmetrically and there is no tongue deviation. Hearing is intact to conversational tone. Sensation: Sensation is intact to light touch throughout Motor: Strength is 5/5 in the bilateral upper and lower extremities.   Shoulder shrug is equal and symmetric.  There is no pronator drift.   Movement examination: Tone: There is normal tone in the bilateral upper extremities.  The tone in the lower extremities is normal.  Abnormal movements: There is virtually no tremor today and also no  myoclonus Coordination:  There is no decremation with RAM's, with any form or rapid alternating movements, including alternating supination and pronation of the forearm, hand opening and closing, finger taps, heel taps and toe taps. Gait and Station: Not tested today  LABS  No results found for: TSH    Chemistry      Component Value Date/Time   NA 140 01/26/2016 1058   K 3.6 01/26/2016 1058   CL 92 (L) 01/26/2016 1058   CO2 38 (H) 01/26/2016 1058   BUN 27 (H) 01/26/2016 1058   CREATININE 1.19 01/26/2016 1058      Component Value Date/Time   CALCIUM 8.9 01/26/2016 1058   ALKPHOS 83 09/26/2015 1416   AST 15 09/26/2015 1416   ALT 9 09/26/2015 1416   BILITOT 0.3 09/26/2015 1416     Lab Results  Component Value Date   WBC 11.5 (H) 01/26/2016   HGB 10.9 (L) 01/26/2016   HCT 32.5 (L) 01/26/2016   MCV 81.7 01/26/2016   PLT 313.0 01/26/2016     ASSESSMENT/PLAN:  1.  Tremor  - She will continue on the currently primidone 50 mg daily.  It is well controlled.  Off of xarelto now so not concerned about that interaction.  Risks, benefits, side effects and alternative therapies were discussed.  The opportunity to ask questions was given and they were answered to the best of my ability.  The patient expressed understanding and willingness to follow the outlined treatment protocols.  2. Myoclonus  -Completely gone now that she is off of gabapentin.  3.  Peripheral neuropathy, diabetic  - Gabapentin caused myoclonus.  She has been on Lyrica in the past and insists it caused sores, and even if it didn't it is very similar to gabapentin and can contribute to myoclonus.  She had hallucinations with Cymbalta..  We could use one of the old tricyclics if we have to, but she didn't appear to be too bothered by this today.  Had significant difficulty focusing on topics today.  4.  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  She cannot have an MRI of the brain.   CT of the brain was unrevealing and looked normal.  She will have a VEP.  We'll get this scheduled through [facility] neurology.  Will f/u prn as not sure what we can do for this even if abnormal.  Following with neuro-ophthalmology.

## 2016-01-28 ENCOUNTER — Encounter: Payer: Self-pay | Admitting: Family Medicine

## 2016-01-29 ENCOUNTER — Ambulatory Visit (INDEPENDENT_AMBULATORY_CARE_PROVIDER_SITE_OTHER): Payer: Medicare Other | Admitting: Neurology

## 2016-01-29 ENCOUNTER — Other Ambulatory Visit: Payer: Self-pay

## 2016-01-29 ENCOUNTER — Encounter: Payer: Self-pay | Admitting: Neurology

## 2016-01-29 VITALS — BP 104/70 | HR 93

## 2016-01-29 DIAGNOSIS — G250 Essential tremor: Secondary | ICD-10-CM | POA: Diagnosis not present

## 2016-01-29 DIAGNOSIS — H47619 Cortical blindness, unspecified side of brain: Secondary | ICD-10-CM

## 2016-01-29 MED ORDER — ERGOCALCIFEROL 1.25 MG (50000 UT) PO CAPS: ORAL_CAPSULE | ORAL | 0 refills | Status: DC

## 2016-01-30 ENCOUNTER — Encounter: Payer: Self-pay | Admitting: Pulmonary Disease

## 2016-01-30 ENCOUNTER — Ambulatory Visit (INDEPENDENT_AMBULATORY_CARE_PROVIDER_SITE_OTHER): Payer: Medicare Other | Admitting: Pulmonary Disease

## 2016-01-30 VITALS — BP 126/74 | HR 78 | Ht 59.0 in | Wt 207.4 lb

## 2016-01-30 DIAGNOSIS — J449 Chronic obstructive pulmonary disease, unspecified: Secondary | ICD-10-CM | POA: Diagnosis not present

## 2016-01-30 DIAGNOSIS — J309 Allergic rhinitis, unspecified: Secondary | ICD-10-CM | POA: Diagnosis not present

## 2016-01-30 DIAGNOSIS — J45909 Unspecified asthma, uncomplicated: Secondary | ICD-10-CM

## 2016-01-30 DIAGNOSIS — G4733 Obstructive sleep apnea (adult) (pediatric): Secondary | ICD-10-CM | POA: Diagnosis not present

## 2016-01-30 DIAGNOSIS — Z23 Encounter for immunization: Secondary | ICD-10-CM

## 2016-01-30 DIAGNOSIS — J9611 Chronic respiratory failure with hypoxia: Secondary | ICD-10-CM

## 2016-01-30 MED ORDER — ALBUTEROL SULFATE (2.5 MG/3ML) 0.083% IN NEBU: 2.5000 mg | INHALATION_SOLUTION | Freq: Four times a day (QID) | RESPIRATORY_TRACT | 3 refills | Status: DC | PRN

## 2016-01-30 MED ORDER — MONTELUKAST SODIUM 10 MG PO TABS: 10.0000 mg | ORAL_TABLET | Freq: Every day | ORAL | 3 refills | Status: DC

## 2016-01-30 MED ORDER — IPRATROPIUM-ALBUTEROL 0.5-2.5 (3) MG/3ML IN SOLN: 3.0000 mL | Freq: Four times a day (QID) | RESPIRATORY_TRACT | 1 refills | Status: DC

## 2016-01-30 MED ORDER — FLUTICASONE PROPIONATE 50 MCG/ACT NA SUSP: 1.0000 | Freq: Every day | NASAL | 1 refills | Status: DC | PRN

## 2016-01-30 MED ORDER — BUDESONIDE 0.5 MG/2ML IN SUSP: RESPIRATORY_TRACT | 3 refills | Status: DC

## 2016-01-30 NOTE — Addendum Note (Signed)
Addended by: Virl Cagey on: 01/30/2016 02:39 PM   Modules accepted: Orders

## 2016-01-30 NOTE — Patient Instructions (Signed)
Flu shot and Prevnar shots today  Follow up in 6 months

## 2016-01-30 NOTE — Progress Notes (Signed)
Current Outpatient Prescriptions on File Prior to Visit  Medication Sig  . atorvastatin (LIPITOR) 20 MG tablet Take 1 tablet (20 mg total) by mouth daily.  Marland Kitchen diltiazem (CARDIZEM) 30 MG tablet Take 1 tablet (30 mg total) by mouth 2 (two) times daily.  Marland Kitchen docusate sodium (COLACE) 100 MG capsule Take 100 mg by mouth daily.  . ergocalciferol (VITAMIN D2) 50000 units capsule Take 1 capsule by mouth every two weeks.  Marland Kitchen escitalopram (LEXAPRO) 10 MG tablet Take 10 mg by mouth at bedtime.   . fenofibrate 54 MG tablet Take 1 tablet (54 mg total) by mouth daily.  . furosemide (LASIX) 40 MG tablet Take 2 tablets (80 mg total) by mouth 2 (two) times daily.  . Insulin Glargine (TOUJEO SOLOSTAR) 300 UNIT/ML SOPN Inject 80 Units into the skin at bedtime.  . insulin lispro (HUMALOG KWIKPEN) 100 UNIT/ML KiwkPen Inject 25-35 units under skin 3x a day  . Insulin Pen Needle 32G X 4 MM MISC Use to inject insulin 4 times daily.  . metolazone (ZAROXOLYN) 2.5 MG tablet Take 1 tablet (2.5 mg total) by mouth daily.  . mirtazapine (REMERON) 15 MG tablet Take 15 mg by mouth at bedtime.  . modafinil (PROVIGIL) 200 MG tablet Take 1 tablet (200 mg total) by mouth daily.  . mupirocin cream (BACTROBAN) 2 % Apply 1 application topically 2 (two) times daily.  . nitroGLYCERIN (NITROSTAT) 0.4 MG SL tablet Place 1 tablet (0.4 mg total) under the tongue every 5 (five) minutes as needed for chest pain.  Marland Kitchen nystatin (MYCOSTATIN/NYSTOP) 100000 UNIT/GM POWD Apply topically 2 (two) times daily. Apply under breasts  . ondansetron (ZOFRAN-ODT) 4 MG disintegrating tablet Take 1 tablet (4 mg total) by mouth 2 (two) times daily as needed for nausea or vomiting.  Marland Kitchen oxyCODONE (OXY IR/ROXICODONE) 5 MG immediate release tablet Take 5 mg by mouth 2 (two) times daily.  . OXYGEN Inhale 3 L into the lungs continuous. 3 liters 24/7  . pantoprazole (PROTONIX) 20 MG tablet Take 1 tablet (20 mg total) by mouth 2 (two) times daily before a meal.  . potassium  chloride SA (K-DUR,KLOR-CON) 20 MEQ tablet Take 1 tablet (20 mEq total) by mouth 3 (three) times daily.  Marland Kitchen PRESCRIPTION MEDICATION Inhale into the lungs See admin instructions. Use BIPAP every time laying down  . primidone (MYSOLINE) 50 MG tablet Take 1 tablet (50 mg total) by mouth at bedtime.  . Probiotic Product (PROBIOTIC PO) Take 1 tablet by mouth at bedtime.  . triamcinolone lotion (KENALOG) 0.1 % Apply 1 application topically 3 (three) times daily. For up to 14 days and as needed  . Vitamins A & D (VITAMIN A & D PO) Take by mouth.   No current facility-administered medications on file prior to visit.     Chief Complaint  Patient presents with  . Follow-up    Pt having increased SOB. Using BiPAP machine. Denies  DME: [facility]    Tests PSG 1991 Echo 05/30/14 >> EF 45 to 50% PFT 10/24/14 >> FEV1 0.64 (30%), FEV1% 65, TLC 2.92 (64%), DLCO 54%, + BD Bipap 11/01/15 to 01/29/16 >> used on 89 of 90 nights with average 10 hrs 19 min.  Average AHI 2.4 with Bipap 16/10 cm H2O  Past medical history DM, CAD, A fib, Systolic CHF, Bipolar, Fibromyalgia, HH, GERD  Past surgical hx, Medications, Allergies, Family hx, Social hx all reviewed.  Vital signs BP 126/74 (BP Location: Left Arm, Cuff Size: Normal)   Pulse 78  Ht 4\' 11"  (1.499 m)   Wt 207 lb 6.4 oz (94.1 kg)   SpO2 98%   BMI 41.89 kg/m   History of Present Illness: Denise Freeman is a 65 y.o. female former smoker with COPD, OSA, and chronic hypoxic/hypercapnic respiratory failure with OHS.  She gets winded with any activity.  She has occasional cough with clear sputum.  She uses BiPAP nightly.  Her main concern is with her vision.  She uses 3 liters oxygen 24/7.   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 - continue budesonide, ipratropium, albuterol via nebulizer - continue singulair - flu, prevnar shots today  Obstructive sleep apnea. - she is compliant with therapy and reports benefit - continue BiPAP 16/10 cm H2O qhs  Chronic respiratory failure 2nd to COPD and Obesity hypoventilation syndrome. - continue 3 liters oxygen [id number]  Chronic systolic heart failure. - f/u with cardiology  Goals of Care. Plan: - DNR/DNI   Patient Instructions  Flu shot and Prevnar shots today  Follow up in 6 months   Chesley Mires, MD [facility] Pager:  [phone] 01/30/2016, 12:47 PM

## 2016-01-31 ENCOUNTER — Telehealth: Payer: Self-pay | Admitting: Neurology

## 2016-01-31 NOTE — Telephone Encounter (Signed)
Spoke with Denise Freeman and patient is scheduled on 02/22/16.

## 2016-01-31 NOTE — Telephone Encounter (Signed)
Left message for Denise Freeman to call back

## 2016-01-31 NOTE — Telephone Encounter (Signed)
Denise Freeman with [facility] called in regards to PT/Dawn CB#[phone] [phone]

## 2016-02-12 ENCOUNTER — Telehealth: Payer: Self-pay | Admitting: Neurology

## 2016-02-12 NOTE — Telephone Encounter (Signed)
PT called in regards to having a test/Dawn  CB#[phone]

## 2016-02-13 NOTE — Telephone Encounter (Signed)
Left message on machine for patient to call back.

## 2016-02-14 ENCOUNTER — Encounter: Payer: Self-pay | Admitting: Gastroenterology

## 2016-02-21 DIAGNOSIS — G894 Chronic pain syndrome: Secondary | ICD-10-CM | POA: Diagnosis not present

## 2016-02-21 DIAGNOSIS — M542 Cervicalgia: Secondary | ICD-10-CM | POA: Diagnosis not present

## 2016-02-21 DIAGNOSIS — M545 Low back pain: Secondary | ICD-10-CM | POA: Diagnosis not present

## 2016-02-21 DIAGNOSIS — Z79891 Long term (current) use of opiate analgesic: Secondary | ICD-10-CM | POA: Diagnosis not present

## 2016-02-22 ENCOUNTER — Telehealth: Payer: Self-pay | Admitting: Neurology

## 2016-02-22 ENCOUNTER — Ambulatory Visit (INDEPENDENT_AMBULATORY_CARE_PROVIDER_SITE_OTHER): Payer: Medicare Other | Admitting: Neurology

## 2016-02-22 DIAGNOSIS — H47619 Cortical blindness, unspecified side of brain: Secondary | ICD-10-CM

## 2016-02-22 NOTE — Telephone Encounter (Signed)
Patient made aware. Visual evoked response test faxed to Deloria Lair, MD.

## 2016-02-22 NOTE — Procedures (Signed)
    History:   Denise Freeman is a 65 year old patient with a history of tremor. She has reported onset of visual loss with the peripheral vision and with central vision. The onset of visual disturbance apparently began in January of 2017 following a GI bleed. The patient is being evaluated for the visual changes.   Description: The visual evoked response test was performed today using 32 x 32 check sizes. The absolute latencies for the N1 and the P100 wave forms were within normal limits bilaterally. The amplitudes for the P100 wave forms were also within normal limits bilaterally. The visual acuity was 20/70 OD and 20/70 OS corrected.  Impression:  The visual evoked response test above was within normal limits bilaterally. No evidence of conduction slowing was seen within the anterior visual pathways on either side on today's evaluation.

## 2016-02-22 NOTE — Telephone Encounter (Signed)
-----   Message from [place], DO sent at 02/22/2016  1:34 PM EDT ----- Please let pt know that her visual evoked potential was normal, as requested by her opthalmologist at [facility].  I have no known neurologic reason for vision change and may need to try and call back ophth.  Please fax to ophthalmology.

## 2016-02-28 ENCOUNTER — Encounter: Payer: Self-pay | Admitting: Internal Medicine

## 2016-02-28 ENCOUNTER — Ambulatory Visit (INDEPENDENT_AMBULATORY_CARE_PROVIDER_SITE_OTHER): Payer: Medicare Other | Admitting: Internal Medicine

## 2016-02-28 VITALS — BP 130/82 | HR 95 | Wt 205.0 lb

## 2016-02-28 DIAGNOSIS — E11319 Type 2 diabetes mellitus with unspecified diabetic retinopathy without macular edema: Secondary | ICD-10-CM

## 2016-02-28 LAB — POCT GLYCOSYLATED HEMOGLOBIN (HGB A1C): HEMOGLOBIN A1C: 9

## 2016-02-28 MED ORDER — INSULIN GLARGINE 300 UNIT/ML ~~LOC~~ SOPN: 40.0000 [IU] | PEN_INJECTOR | Freq: Two times a day (BID) | SUBCUTANEOUS | 1 refills | Status: DC

## 2016-02-28 MED ORDER — INSULIN PEN NEEDLE 32G X 4 MM MISC: 3 refills | Status: DC

## 2016-02-28 MED ORDER — METFORMIN HCL ER 500 MG PO TB24: 500.0000 mg | ORAL_TABLET | Freq: Every day | ORAL | 3 refills | Status: DC

## 2016-02-28 MED ORDER — FENOFIBRATE 54 MG PO TABS: 54.0000 mg | ORAL_TABLET | Freq: Every day | ORAL | 3 refills | Status: DC

## 2016-02-28 NOTE — Progress Notes (Signed)
Patient ID: Denise Freeman, female   DOB: June 21, 1950, 65 y.o.   MRN: [id number]  HPI: Denise Freeman is a 65 y.o.-year-old female, initially referred by her PCP, Dr. Loman Chroman, for management of DM2, dx in 2009, insulin-dependent in 2013, uncontrolled, with complications (CKD, DR, PN). Last visit 3 mo ago.   Last hemoglobin A1c was: Lab Results  Component Value Date   HGBA1C 7.8 11/28/2015   HGBA1C 6.6 (H) 06/25/2015   HGBA1C 11.7 01/26/2015   12/30/2013: HbA1c 8.6% 12/28/2013: HbA1c 7.9% HbA1c 8.7% HbA1c 9.4% HbA1c 7.3%  Pt is on a regimen of: - Toujeo 40 units in am and 50 units at bedtime - Humalog:  - 20 units before b'fast - 27 units before lunch - 32 units before dinner  - Sliding scale of Humalog - 150-175: + 1 unit  - 176-200: + 2 units  - 201-225: + 3 units  - 226-250: + 4 units  - 251-275: + 5 units - >275: + 6 units We stopped Trulicity 4.62 mg under skin weekly >> too expensive We stopped Januvia >> did not help She was on Metformin >> nausea.  Pt checks her sugars 4x a day - forgot log: - am: 160-175, 180 >> 173-189 >> 160s >> 149-157 >> 127-160 >> 150-180 (better lately) >> 160-170 - 2h after b'fast: n/c >> 34x1 >> n/c - before lunch: 170-199 >> 130-150 >> 167-191 >> 195-210 >> 160-183 >> 134-170 >> 155 >> 145-149 - 2h after lunch: n/c >> 160 - before dinner: 179-200 >> 140s (if no snack), 165-170 >> 175-187 >> 140-188 >> 165-170 >> 165 - 2h after dinner: n/c >> 180-190 - bedtime: 165-190 >> 169-238 >> 225-250 >> 180-209, 225 >> 163-188, 230 >> 190s >> 140 (drinks tomato juice) - nighttime:n/c No lows. Lowest sugar was 130 >> 167 >> 140s >> 127 >> 34x1 >> 140; she has hypoglycemia awareness at 70 at 120. Highest sugar was 240 >> 190 >> 200s >> 500 x1, otw 250s >> 230 >> 200s >> 208. Glucometer: FreeStyle  Pt's meals are: - Breakfast: toast with spread or oatmeal with splenda - Lunch: cottage cheese, or fruit - Dinner: chicken/beef/turkey  with vegetables and a starch; macaroni and cheese - Snacks: 2 snacks   - + CKD stage 3, last BUN/creatinine:  Lab Results  Component Value Date   BUN 27 (H) 01/26/2016   CREATININE 1.19 01/26/2016  On Lisinopril, fenofibrate. - last set of lipids: No results found for: CHOL, HDL, LDLCALC, LDLDIRECT, TRIG, CHOLHDL 09/07/2013: 180/135/31/138 On Crestor. - last eye exam was in Summer 2017. + DR OU. She had cataract sx in 05 and 11/2014. - no numbness and tingling in her feet.  ROS: Constitutional: no weight loss, + fatigue, + poor sleep, + nocturia Eyes: no blurry vision, no xerophthalmia ENT: no sore throat, no nodules palpated in throat, no dysphagia/odynophagia, no hoarseness, + hypoacusis Cardiovascular: no CP/+ SOB/+ palpitations/+ leg swelling Respiratory: no cough/+ SOB/+ wheezing Gastrointestinal: + N/no V/+ D/no C, + heartburn Musculoskeletal: + muscle aches/+ joint aches Skin: no rashes, + easy bruising, + itching Neurological: no tremors/numbness/tingling/dizziness  I reviewed pt's medications, allergies, PMH, social hx, family hx, and changes were documented in the history of present illness. Otherwise, unchanged from my initial visit note:  Past Medical History:  Diagnosis Date  . Anemia   . Arthritis   . Asthma   . Atrial fibrillation ([place])   . Bipolar 1 disorder ([place])   . CHF (congestive heart failure) (  [place])   . COPD (chronic obstructive pulmonary disease) ([place])   . Coronary artery disease   . Diabetes mellitus without complication ([place])   . Family history of adverse reaction to anesthesia    Uncle was positive for malignant hyerthermia; patient had testing done and was negative.  . Fibromyalgia   . GERD (gastroesophageal reflux disease)   . History of hiatal hernia   . HOH (hard of hearing)   . Myocardial infarction ([place]) 1990  . 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 Oxygen deficiency    3 LPM  . Shortness of breath dyspnea   . Sleep apnea    uses biPAP,  65   Past Surgical History:  Procedure Laterality Date  . APPENDECTOMY    . bladder stimulator     pt states, "it cannot be turned off".  Marland Kitchen BLADDER SUSPENSION     2003, 2006 and 2010  . CATARACT EXTRACTION W/PHACO Right 11/29/2014   Procedure: CATARACT EXTRACTION PHACO AND INTRAOCULAR LENS PLACEMENT (IOC);  Surgeon: Rutherford Guys, MD;  Location: [facility];  Service: Ophthalmology;  Laterality: Right;  CDE:3.81  . CATARACT EXTRACTION W/PHACO Left 12/13/2014   Procedure: CATARACT EXTRACTION PHACO AND INTRAOCULAR LENS PLACEMENT (IOC);  Surgeon: Rutherford Guys, MD;  Location: [facility];  Service: Ophthalmology;  Laterality: Left;  CDE:6.59  . HEEL SPUR SURGERY Bilateral   . HERNIA REPAIR     umbilical  . I&D EXTREMITY Right 06/13/2015   Procedure: MINOR IRRIGATION AND DEBRIDEMENT EXTREMITY REMOVAL OF NAIL;  Surgeon: Daryll Brod, MD;  Location: [facility];  Service: Orthopedics;  Laterality: Right;  . NECK SURGERY N/A 2009   4, 6, and 7 cervical disc replaced  . TUBAL LIGATION     History   Social History  . Marital Status: Married    Spouse Name: N/A    Number of Children: 1   Occupational History  . disabled   Social History Main Topics  . Smoking status: Former Smoker -- 2.00 packs/day for 14 years    Types: Cigarettes    Quit date: 04/12/1986  . Smokeless tobacco: Not on file  . Alcohol Use: No  . Drug Use: No   Current Outpatient Prescriptions on File Prior to Visit  Medication Sig Dispense Refill  . albuterol (PROVENTIL) (2.5 MG/3ML) 0.083% nebulizer solution Take 3 mLs (2.5 mg total) by nebulization every 6 (six) hours as needed for wheezing or shortness of breath. 1350 mL 3  . atorvastatin (LIPITOR) 20 MG tablet Take 1 tablet (20 mg total) by mouth daily. 90 tablet 3  . budesonide (PULMICORT) 0.5 MG/2ML nebulizer solution USE 2 ML (0.5 MG TOTAL) VIA NEBULIZER TWICE A DAY 360 mL 3  . diltiazem (CARDIZEM) 30 MG tablet Take 1 tablet (30 mg total) by mouth 2 (two) times  daily. 180 tablet 3  . docusate sodium (COLACE) 100 MG capsule Take 100 mg by mouth daily.    . ergocalciferol (VITAMIN D2) 50000 units capsule Take 1 capsule by mouth every two weeks. 7 capsule 0  . escitalopram (LEXAPRO) 10 MG tablet Take 10 mg by mouth at bedtime.     . fenofibrate 54 MG tablet Take 1 tablet (54 mg total) by mouth daily. 90 tablet 1  . fluticasone (FLONASE) 50 MCG/ACT nasal spray Place 1 spray into both nostrils daily as needed for allergies or rhinitis. 32 g 1  . furosemide (LASIX) 40 MG tablet Take 2 tablets (80 mg total) by mouth 2 (two)  times daily. 360 tablet 3  . Insulin Glargine (TOUJEO SOLOSTAR) 300 UNIT/ML SOPN Inject 80 Units into the skin at bedtime. 18 pen 1  . insulin lispro (HUMALOG KWIKPEN) 100 UNIT/ML KiwkPen Inject 25-35 units under skin 3x a day 35 pen 1  . Insulin Pen Needle 32G X 4 MM MISC Use to inject insulin 4 times daily. 400 each 1  . ipratropium-albuterol (DUONEB) 0.5-2.5 (3) MG/3ML SOLN Take 3 mLs by nebulization 4 (four) times daily. 1080 mL 1  . metolazone (ZAROXOLYN) 2.5 MG tablet Take 1 tablet (2.5 mg total) by mouth daily. 90 tablet 3  . mirtazapine (REMERON) 15 MG tablet Take 15 mg by mouth at bedtime.    . modafinil (PROVIGIL) 200 MG tablet Take 1 tablet (200 mg total) by mouth daily. 90 tablet 0  . montelukast (SINGULAIR) 10 MG tablet Take 1 tablet (10 mg total) by mouth at bedtime. 90 tablet 3  . mupirocin cream (BACTROBAN) 2 % Apply 1 application topically 2 (two) times daily. 15 g 0  . nitroGLYCERIN (NITROSTAT) 0.4 MG SL tablet Place 1 tablet (0.4 mg total) under the tongue every 5 (five) minutes as needed for chest pain. 25 tablet 3  . nystatin (MYCOSTATIN/NYSTOP) 100000 UNIT/GM POWD Apply topically 2 (two) times daily. Apply under breasts    . ondansetron (ZOFRAN-ODT) 4 MG disintegrating tablet Take 1 tablet (4 mg total) by mouth 2 (two) times daily as needed for nausea or vomiting. 30 tablet 1  . oxyCODONE (OXY IR/ROXICODONE) 5 MG  immediate release tablet Take 5 mg by mouth 2 (two) times daily.    . OXYGEN Inhale 3 L into the lungs continuous. 3 liters 24/7    . pantoprazole (PROTONIX) 20 MG tablet Take 1 tablet (20 mg total) by mouth 2 (two) times daily before a meal. 180 tablet 1  . potassium chloride SA (K-DUR,KLOR-CON) 20 MEQ tablet Take 1 tablet (20 mEq total) by mouth 3 (three) times daily. 270 tablet 0  . PRESCRIPTION MEDICATION Inhale into the lungs See admin instructions. Use BIPAP every time laying down    . primidone (MYSOLINE) 50 MG tablet Take 1 tablet (50 mg total) by mouth at bedtime. 90 tablet 1  . Probiotic Product (PROBIOTIC PO) Take 1 tablet by mouth at bedtime.    . triamcinolone lotion (KENALOG) 0.1 % Apply 1 application topically 3 (three) times daily. For up to 14 days and as needed 60 mL 1  . Vitamins A & D (VITAMIN A & D PO) Take by mouth.     No current facility-administered medications on file prior to visit.    Allergies  Allergen Reactions  . Ancef [Cefazolin] Nausea And Vomiting  . Augmentin [Amoxicillin-Pot Clavulanate] Itching  . Ciprofloxacin Itching  . Haldol [Haloperidol] Other (See Comments)    Restless leg  . Levaquin [Levofloxacin In D5w] Other (See Comments)    "afib"  . Lyrica [Pregabalin] Hives  . Nsaids Diarrhea  . Tamiflu [Oseltamivir Phosphate] Other (See Comments)    "water blisters"  . Topiramate Nausea Only  . Zoloft [Sertraline Hcl] Other (See Comments)    Jaw problems, jittery  . Penicillins Itching, Nausea And Vomiting and Rash    Has patient had a PCN reaction causing immediate rash, facial/tongue/throat swelling, SOB or lightheadedness with hypotension: Yes Has patient had a PCN reaction causing severe rash involving mucus membranes or skin necrosis: No Has patient had a PCN reaction that required hospitalization No Has patient had a PCN reaction occurring within the  last 10 years: Yes If all of the above answers are "NO", then may proceed with Cephalosporin  use.   Family History  Problem Relation Age of Onset  . Heart disease Mother   . COPD Mother   . Cancer Mother     Breast cancer  . Diabetes Mother   . Heart disease Father   . Hyperlipidemia Father   . COPD Sister   . Heart disease Sister   . Diabetes Sister   . Heart disease Maternal Grandmother    PE: BP 130/82 (BP Location: Left Arm, Patient Position: Sitting)   Pulse 95   Wt 205 lb (93 kg)   SpO2 97%   BMI 41.40 kg/m  Body mass index is 41.4 kg/m. Wt Readings from Last 3 Encounters:  02/28/16 205 lb (93 kg)  01/30/16 207 lb 6.4 oz (94.1 kg)  11/28/15 203 lb (92.1 kg)   Constitutional: obese, in wheelchair, in NAD Eyes: PERRLA, EOMI, no exophthalmos ENT: moist mucous membranes, no thyromegaly, no cervical lymphadenopathy Cardiovascular: irreg. Irreg. , No MRG Respiratory: CTA B Gastrointestinal: abdomen soft, NT, ND, BS+ Musculoskeletal: no deformities, strength intact in all 4 Skin: moist, warm Neurological: + tremor with outstretched hands, DTR normal in all 4  ASSESSMENT: 1. DM2, insulin-dependent, uncontrolled, with complications - CKD stage 3 - DR - PN  PLAN:  1. Patient with long-standing, uncontrolled diabetes, on basal-bolus insulin regimen, now with higher sugars. We split the toujeo at last visit, but this did not help. She may have low sugars at bedtime >> will reduce the Humalog before dinner and Toujeo at bedtime. Will try to add Metformin ER, low dose, also. - I advised her to:  Patient Instructions  Please change: - Toujeo 50 units in am and 40 units at bedtime  Please decrease: - Humalog:  - 20 units before b'fast - 28 units before lunch - 28 units before dinner  Continue  - Sliding scale of Humalog - 150-175: + 1 unit  - 176-200: + 2 units  - 201-225: + 3 units  - 226-250: + 4 units  - 251-275: + 5 units - >275: + 6 units  Please start Metformin ER 500 mg 2x a day.  Please return in 3 months with your sugar log.   - Strongly  advised her to continue checking sugars at different times of the day - check 4 times a day, rotating checks  - advised for yearly eye exams >> UTD - Check HbA1c today, and this is higher, at 9.0% - Return to clinic in 3 mo with sugar log   Philemon Kingdom, MD PhD [facility] Endocrinology

## 2016-02-28 NOTE — Patient Instructions (Addendum)
Please change: - Toujeo 50 units in am and 40 units at bedtime  Please decrease: - Humalog:  - 20 units before b'fast - 28 units before lunch - 28 units before dinner  Continue  - Sliding scale of Humalog - 150-175: + 1 unit  - 176-200: + 2 units  - 201-225: + 3 units  - 226-250: + 4 units  - 251-275: + 5 units - >275: + 6 units  Please start Metformin ER 500 mg 2x a day.  Please return in 3 months with your sugar log.

## 2016-02-28 NOTE — Addendum Note (Signed)
Addended by: Caprice Beaver T on: 02/28/2016 12:01 PM   Modules accepted: Orders

## 2016-03-01 ENCOUNTER — Encounter (INDEPENDENT_AMBULATORY_CARE_PROVIDER_SITE_OTHER): Payer: Self-pay

## 2016-03-01 ENCOUNTER — Encounter: Payer: Self-pay | Admitting: Gastroenterology

## 2016-03-01 ENCOUNTER — Ambulatory Visit (INDEPENDENT_AMBULATORY_CARE_PROVIDER_SITE_OTHER): Payer: Medicare Other | Admitting: Internal Medicine

## 2016-03-01 VITALS — BP 122/72 | HR 96 | Ht 59.0 in | Wt 201.0 lb

## 2016-03-01 DIAGNOSIS — D649 Anemia, unspecified: Secondary | ICD-10-CM

## 2016-03-01 DIAGNOSIS — K921 Melena: Secondary | ICD-10-CM

## 2016-03-01 NOTE — Progress Notes (Signed)
Denise Freeman 65 y.o. [birth year] [id number]  Assessment & Plan:   . 1. Blood in stool   2. Chronic anemia    The patient had melena in early 2017 and never had a workup. That was when she was on Xarelto which she has stopped. She is still anemic. This is improved. I think it would be appropriate to work her up with an EGD and colonoscopy as we really aren't sure where she bled from.The risks and benefits as well as alternatives of endoscopic procedure(s) have been discussed and reviewed. All questions answered. The patient agrees to proceed.   She has fairly complicated and has large insulin doses, with respect to her diabetes. We will continue her on her morning and new diabetic insulin doses the day of admission to the hospital, I'm going to put her in observation for her prep as she reports that she has a lot of vomiting issues and cannot take a colonoscopy prep easily. We'll plan to use IV Reglan every 6 hours running while she's on the prep.  Consider hospitalist help with her diabetes management while in house.   Subjective:   Chief Complaint: Gastrointestinal bleeding  HPI The patient is an elderly white woman with severe diabetes, near blindness, bipolar disorder, atrial fibrillation and coronary artery disease as well as GERD and fibromyalgia with a history of a upper GI bleed suspected by melena and increased BUN in early 2017. She left the hospital without having an endoscopy as recommended, I'm not sure why but she signed out [place]. She was subsequently seen at [facility] No procedures scheduled. She is here now reporting intermittent dark and red stools. This is chronic and intermittent. She complains of some periumbilical soreness where she had an umbilical hernia repair. She is quite talkative today. Lab Results  Component Value Date   WBC 11.5 (H) 01/26/2016   HGB 10.9 (L) 01/26/2016   HCT 32.5 (L) 01/26/2016   MCV 81.7 01/26/2016   PLT 313.0  01/26/2016   Allergies  Allergen Reactions  . Ancef [Cefazolin] Nausea And Vomiting  . Levaquin [Levofloxacin In D5w] Other (See Comments)    "afib"  . Lyrica [Pregabalin] Hives  . Tamiflu [Oseltamivir Phosphate] Other (See Comments)    "water blisters"  . Zoloft [Sertraline Hcl] Other (See Comments)    Jaw problems, jittery  . Augmentin [Amoxicillin-Pot Clavulanate] Itching  . Ciprofloxacin Itching  . Haldol [Haloperidol] Other (See Comments)    Restless leg  . Nsaids Diarrhea  . Penicillins Itching, Nausea And Vomiting and Rash    Has patient had a PCN reaction causing immediate rash, facial/tongue/throat swelling, SOB or lightheadedness with hypotension: Yes Has patient had a PCN reaction causing severe rash involving mucus membranes or skin necrosis: No Has patient had a PCN reaction that required hospitalization No Has patient had a PCN reaction occurring within the last 10 years: Yes If all of the above answers are "NO", then may proceed with Cephalosporin use.  . Topamax [Topiramate] Nausea Only  . Xarelto [Rivaroxaban]    Outpatient Medications Prior to Visit  Medication Sig Dispense Refill  . albuterol (PROVENTIL) (2.5 MG/3ML) 0.083% nebulizer solution Take 3 mLs (2.5 mg total) by nebulization every 6 (six) hours as needed for wheezing or shortness of breath. 1350 mL 3  . atorvastatin (LIPITOR) 20 MG tablet Take 1 tablet (20 mg total) by mouth daily. 90 tablet 3  . budesonide (PULMICORT) 0.5 MG/2ML nebulizer solution USE 2 ML (0.5 MG TOTAL) VIA  NEBULIZER TWICE A DAY 360 mL 3  . diltiazem (CARDIZEM) 30 MG tablet Take 1 tablet (30 mg total) by mouth 2 (two) times daily. 180 tablet 3  . docusate sodium (COLACE) 100 MG capsule Take 100 mg by mouth daily.    . ergocalciferol (VITAMIN D2) 50000 units capsule Take 1 capsule by mouth every two weeks. 7 capsule 0  . escitalopram (LEXAPRO) 10 MG tablet Take 10 mg by mouth at bedtime.     . fenofibrate 54 MG tablet Take 1 tablet (54  mg total) by mouth daily. 90 tablet 3  . ferrous sulfate 324 (65 Fe) MG TBEC Take 1 tablet by mouth daily.    . fluticasone (FLONASE) 50 MCG/ACT nasal spray Place 1 spray into both nostrils daily as needed for allergies or rhinitis. 32 g 1  . furosemide (LASIX) 40 MG tablet Take 2 tablets (80 mg total) by mouth 2 (two) times daily. (Patient taking differently: Take 60 mg by mouth 2 (two) times daily. ) 360 tablet 3  . Insulin Glargine (TOUJEO SOLOSTAR) 300 UNIT/ML SOPN Inject 40-50 Units into the skin 2 times daily at 12 noon and 4 pm. 18 pen 1  . insulin lispro (HUMALOG KWIKPEN) 100 UNIT/ML KiwkPen Inject 25-35 units under skin 3x a day 35 pen 1  . Insulin Pen Needle 32G X 4 MM MISC Use to inject insulin 4 times daily. 400 each 3  . ipratropium-albuterol (DUONEB) 0.5-2.5 (3) MG/3ML SOLN Take 3 mLs by nebulization 4 (four) times daily. 1080 mL 1  . metolazone (ZAROXOLYN) 2.5 MG tablet Take 1 tablet (2.5 mg total) by mouth daily. 90 tablet 3  . mirtazapine (REMERON) 15 MG tablet Take 15 mg by mouth at bedtime.    . modafinil (PROVIGIL) 200 MG tablet Take 1 tablet (200 mg total) by mouth daily. 90 tablet 0  . montelukast (SINGULAIR) 10 MG tablet Take 1 tablet (10 mg total) by mouth at bedtime. 90 tablet 3  . mupirocin cream (BACTROBAN) 2 % Apply 1 application topically 2 (two) times daily. 15 g 0  . nitroGLYCERIN (NITROSTAT) 0.4 MG SL tablet Place 1 tablet (0.4 mg total) under the tongue every 5 (five) minutes as needed for chest pain. 25 tablet 3  . nystatin (MYCOSTATIN/NYSTOP) 100000 UNIT/GM POWD Apply topically 2 (two) times daily. Apply under breasts    . ondansetron (ZOFRAN-ODT) 4 MG disintegrating tablet Take 1 tablet (4 mg total) by mouth 2 (two) times daily as needed for nausea or vomiting. 30 tablet 1  . oxyCODONE (OXY IR/ROXICODONE) 5 MG immediate release tablet Take 5 mg by mouth 2 (two) times daily.    . OXYGEN Inhale 3 L into the lungs continuous. 3 liters 24/7    . pantoprazole  (PROTONIX) 20 MG tablet Take 1 tablet (20 mg total) by mouth 2 (two) times daily before a meal. 180 tablet 1  . potassium chloride SA (K-DUR,KLOR-CON) 20 MEQ tablet Take 1 tablet (20 mEq total) by mouth 3 (three) times daily. 270 tablet 0  . PRESCRIPTION MEDICATION Inhale into the lungs See admin instructions. Use BIPAP every time laying down    . primidone (MYSOLINE) 50 MG tablet Take 1 tablet (50 mg total) by mouth at bedtime. 90 tablet 1  . Probiotic Product (PROBIOTIC PO) Take 1 tablet by mouth at bedtime.    . triamcinolone lotion (KENALOG) 0.1 % Apply 1 application topically 3 (three) times daily. For up to 14 days and as needed 60 mL 1  . VITAMIN  E PO Take 1 tablet by mouth daily.    . Vitamins A & D (VITAMIN A & D PO) Take by mouth.    . metFORMIN (GLUCOPHAGE-XR) 500 MG 24 hr tablet Take 1 tablet (500 mg total) by mouth daily with breakfast. (Patient not taking: Reported on 03/01/2016) 180 tablet 3   No facility-administered medications prior to visit.    Past Medical History:  Diagnosis Date  . Anemia   . Anxiety   . Arthritis   . Asthma   . Atrial fibrillation ([place])   . Bipolar 1 disorder ([place])   . CHF (congestive heart failure) ([place])   . Colon polyps   . COPD (chronic obstructive pulmonary disease) ([place])   . Coronary artery disease   . Depression   . Diabetes mellitus without complication ([place])   . Family history of adverse reaction to anesthesia    Uncle was positive for malignant hyerthermia; patient had testing done and was negative.  . Fibromyalgia   . GERD (gastroesophageal reflux disease)   . Gout   . History of hiatal hernia   . HOH (hard of hearing)   . Myocardial infarction ([place]) 1990  . 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 Oxygen deficiency    3 LPM  . Pneumonia   . Shortness of breath dyspnea   . Sleep apnea    uses biPAP, 10   Past Surgical History:  Procedure Laterality Date  . APPENDECTOMY    . bladder stimulator     pt states, "it cannot be turned  off".  Marland Kitchen BLADDER SUSPENSION     2003, 2006 and 2010  . CATARACT EXTRACTION W/PHACO Right 11/29/2014   Procedure: CATARACT EXTRACTION PHACO AND INTRAOCULAR LENS PLACEMENT (IOC);  Surgeon: Rutherford Guys, MD;  Location: [facility];  Service: Ophthalmology;  Laterality: Right;  CDE:3.81  . CATARACT EXTRACTION W/PHACO Left 12/13/2014   Procedure: CATARACT EXTRACTION PHACO AND INTRAOCULAR LENS PLACEMENT (IOC);  Surgeon: Rutherford Guys, MD;  Location: [facility];  Service: Ophthalmology;  Laterality: Left;  CDE:6.59  . CERVICAL DISC SURGERY N/A 2009   4, 6, and 7 cervical disc replaced  . HEEL SPUR SURGERY Bilateral   . I&D EXTREMITY Right 06/13/2015   Procedure: MINOR IRRIGATION AND DEBRIDEMENT EXTREMITY REMOVAL OF NAIL;  Surgeon: Daryll Brod, MD;  Location: [facility];  Service: Orthopedics;  Laterality: Right;  . TUBAL LIGATION    . UMBILICAL HERNIA REPAIR     Social History   Social History  . Marital status: Married    Spouse name: N/A  . Number of children: 1  . Years of education: N/A   Occupational History  . house wife    Social History Main Topics  . Smoking status: Former Smoker    Packs/day: 2.00    Years: 14.00    Types: Cigarettes    Quit date: 04/12/1989  . Smokeless tobacco: Never Used  . Alcohol use No  . Drug use: No  . Sexual activity: No   Other Topics Concern  . None   Social History Narrative   Married   Disabled   1 child; 22 yrs   6 pregnancies   1 child      Family History  Problem Relation Age of Onset  . Heart disease Mother   . COPD Mother   . Diabetes Mother   . Breast cancer Mother   . Heart disease Father   . Hyperlipidemia Father   . COPD Sister   .  Heart disease Sister   . Diabetes Sister   . Heart disease Maternal Grandmother          Review of Systems Uses a scooter because of bilateral knee pain, on chronic oxygen therapy.  Objective:   Physical Exam BP 122/72 (BP Location: Left Arm, Patient Position: Sitting, Cuff  Size: Normal)   Pulse 96   Ht 4\' 11"  (1.499 m)   Wt 201 lb (91.2 kg)   BMI 40.60 kg/m  Obese talkative NAD Pale Lungs cta decreased  Heart sounds are distant Abdomen is obese soft, slightly tender but no periumbilical hernia Psych she is very talkative somewhat anxious, pressured speech at times  Data reviewed: Previous hospitalization report early 2017 including my consult labs in the [facility] GI note

## 2016-03-01 NOTE — Patient Instructions (Signed)
   You will be admitted October 12th for a EGD and colonoscopy to be done October 13th 2017 at [facility].    Bed control will call you and tell you when to be there on October 12th.       No solid food on October 12th, do clear liquids that day.  See attached list.    Take your insulin as you normally would.     I appreciate the opportunity to care for you. Silvano Rusk, MD, [facility]

## 2016-03-14 ENCOUNTER — Encounter (HOSPITAL_COMMUNITY): Payer: Self-pay | Admitting: General Practice

## 2016-03-14 ENCOUNTER — Telehealth: Payer: Self-pay | Admitting: Pulmonary Disease

## 2016-03-14 ENCOUNTER — Observation Stay (HOSPITAL_COMMUNITY)
Admission: RE | Admit: 2016-03-14 | Discharge: 2016-03-15 | Disposition: A | Discharge status: Home / Self Care | Payer: Medicare Other | Source: Ambulatory Visit | Attending: Internal Medicine | Admitting: Internal Medicine

## 2016-03-14 DIAGNOSIS — Z9981 Dependence on supplemental oxygen: Secondary | ICD-10-CM | POA: Diagnosis not present

## 2016-03-14 DIAGNOSIS — I482 Chronic atrial fibrillation: Secondary | ICD-10-CM | POA: Diagnosis not present

## 2016-03-14 DIAGNOSIS — K621 Rectal polyp: Secondary | ICD-10-CM | POA: Insufficient documentation

## 2016-03-14 DIAGNOSIS — D128 Benign neoplasm of rectum: Secondary | ICD-10-CM

## 2016-03-14 DIAGNOSIS — Z66 Do not resuscitate: Secondary | ICD-10-CM | POA: Insufficient documentation

## 2016-03-14 DIAGNOSIS — Z9842 Cataract extraction status, left eye: Secondary | ICD-10-CM | POA: Insufficient documentation

## 2016-03-14 DIAGNOSIS — K219 Gastro-esophageal reflux disease without esophagitis: Secondary | ICD-10-CM | POA: Diagnosis not present

## 2016-03-14 DIAGNOSIS — R12 Heartburn: Secondary | ICD-10-CM | POA: Insufficient documentation

## 2016-03-14 DIAGNOSIS — D120 Benign neoplasm of cecum: Secondary | ICD-10-CM

## 2016-03-14 DIAGNOSIS — Z880 Allergy status to penicillin: Secondary | ICD-10-CM | POA: Insufficient documentation

## 2016-03-14 DIAGNOSIS — M109 Gout, unspecified: Secondary | ICD-10-CM | POA: Insufficient documentation

## 2016-03-14 DIAGNOSIS — IMO0002 Reserved for concepts with insufficient information to code with codable children: Secondary | ICD-10-CM

## 2016-03-14 DIAGNOSIS — Z881 Allergy status to other antibiotic agents status: Secondary | ICD-10-CM | POA: Insufficient documentation

## 2016-03-14 DIAGNOSIS — E1365 Other specified diabetes mellitus with hyperglycemia: Secondary | ICD-10-CM

## 2016-03-14 DIAGNOSIS — D62 Acute posthemorrhagic anemia: Secondary | ICD-10-CM | POA: Diagnosis not present

## 2016-03-14 DIAGNOSIS — I5043 Acute on chronic combined systolic (congestive) and diastolic (congestive) heart failure: Secondary | ICD-10-CM | POA: Diagnosis present

## 2016-03-14 DIAGNOSIS — I5042 Chronic combined systolic (congestive) and diastolic (congestive) heart failure: Secondary | ICD-10-CM | POA: Insufficient documentation

## 2016-03-14 DIAGNOSIS — Z888 Allergy status to other drugs, medicaments and biological substances status: Secondary | ICD-10-CM | POA: Insufficient documentation

## 2016-03-14 DIAGNOSIS — E1142 Type 2 diabetes mellitus with diabetic polyneuropathy: Secondary | ICD-10-CM | POA: Insufficient documentation

## 2016-03-14 DIAGNOSIS — Z79899 Other long term (current) drug therapy: Secondary | ICD-10-CM | POA: Insufficient documentation

## 2016-03-14 DIAGNOSIS — D509 Iron deficiency anemia, unspecified: Secondary | ICD-10-CM | POA: Diagnosis not present

## 2016-03-14 DIAGNOSIS — J9612 Chronic respiratory failure with hypercapnia: Secondary | ICD-10-CM | POA: Diagnosis not present

## 2016-03-14 DIAGNOSIS — Z79891 Long term (current) use of opiate analgesic: Secondary | ICD-10-CM | POA: Insufficient documentation

## 2016-03-14 DIAGNOSIS — I252 Old myocardial infarction: Secondary | ICD-10-CM | POA: Insufficient documentation

## 2016-03-14 DIAGNOSIS — N183 Chronic kidney disease, stage 3 unspecified: Secondary | ICD-10-CM | POA: Diagnosis present

## 2016-03-14 DIAGNOSIS — E7800 Pure hypercholesterolemia, unspecified: Secondary | ICD-10-CM | POA: Insufficient documentation

## 2016-03-14 DIAGNOSIS — J9610 Chronic respiratory failure, unspecified whether with hypoxia or hypercapnia: Secondary | ICD-10-CM | POA: Diagnosis present

## 2016-03-14 DIAGNOSIS — E1322 Other specified diabetes mellitus with diabetic chronic kidney disease: Secondary | ICD-10-CM

## 2016-03-14 DIAGNOSIS — Z803 Family history of malignant neoplasm of breast: Secondary | ICD-10-CM | POA: Insufficient documentation

## 2016-03-14 DIAGNOSIS — Z794 Long term (current) use of insulin: Secondary | ICD-10-CM | POA: Insufficient documentation

## 2016-03-14 DIAGNOSIS — Z9851 Tubal ligation status: Secondary | ICD-10-CM | POA: Insufficient documentation

## 2016-03-14 DIAGNOSIS — Z7951 Long term (current) use of inhaled steroids: Secondary | ICD-10-CM | POA: Insufficient documentation

## 2016-03-14 DIAGNOSIS — H547 Unspecified visual loss: Secondary | ICD-10-CM | POA: Insufficient documentation

## 2016-03-14 DIAGNOSIS — G4733 Obstructive sleep apnea (adult) (pediatric): Secondary | ICD-10-CM | POA: Diagnosis present

## 2016-03-14 DIAGNOSIS — E876 Hypokalemia: Secondary | ICD-10-CM | POA: Insufficient documentation

## 2016-03-14 DIAGNOSIS — F319 Bipolar disorder, unspecified: Secondary | ICD-10-CM | POA: Diagnosis present

## 2016-03-14 DIAGNOSIS — D649 Anemia, unspecified: Secondary | ICD-10-CM

## 2016-03-14 DIAGNOSIS — J449 Chronic obstructive pulmonary disease, unspecified: Secondary | ICD-10-CM

## 2016-03-14 DIAGNOSIS — I4891 Unspecified atrial fibrillation: Secondary | ICD-10-CM | POA: Diagnosis present

## 2016-03-14 DIAGNOSIS — Z9049 Acquired absence of other specified parts of digestive tract: Secondary | ICD-10-CM | POA: Insufficient documentation

## 2016-03-14 DIAGNOSIS — M549 Dorsalgia, unspecified: Secondary | ICD-10-CM | POA: Insufficient documentation

## 2016-03-14 DIAGNOSIS — Z825 Family history of asthma and other chronic lower respiratory diseases: Secondary | ICD-10-CM | POA: Insufficient documentation

## 2016-03-14 DIAGNOSIS — Z886 Allergy status to analgesic agent status: Secondary | ICD-10-CM | POA: Insufficient documentation

## 2016-03-14 DIAGNOSIS — I2510 Atherosclerotic heart disease of native coronary artery without angina pectoris: Secondary | ICD-10-CM | POA: Insufficient documentation

## 2016-03-14 DIAGNOSIS — G629 Polyneuropathy, unspecified: Secondary | ICD-10-CM

## 2016-03-14 DIAGNOSIS — E1122 Type 2 diabetes mellitus with diabetic chronic kidney disease: Secondary | ICD-10-CM | POA: Diagnosis not present

## 2016-03-14 DIAGNOSIS — G8929 Other chronic pain: Secondary | ICD-10-CM | POA: Insufficient documentation

## 2016-03-14 DIAGNOSIS — L989 Disorder of the skin and subcutaneous tissue, unspecified: Secondary | ICD-10-CM | POA: Diagnosis not present

## 2016-03-14 DIAGNOSIS — M797 Fibromyalgia: Secondary | ICD-10-CM | POA: Diagnosis present

## 2016-03-14 DIAGNOSIS — Z833 Family history of diabetes mellitus: Secondary | ICD-10-CM | POA: Insufficient documentation

## 2016-03-14 DIAGNOSIS — E669 Obesity, unspecified: Secondary | ICD-10-CM | POA: Insufficient documentation

## 2016-03-14 DIAGNOSIS — Z9841 Cataract extraction status, right eye: Secondary | ICD-10-CM | POA: Insufficient documentation

## 2016-03-14 DIAGNOSIS — Z87891 Personal history of nicotine dependence: Secondary | ICD-10-CM | POA: Insufficient documentation

## 2016-03-14 DIAGNOSIS — Z8349 Family history of other endocrine, nutritional and metabolic diseases: Secondary | ICD-10-CM | POA: Insufficient documentation

## 2016-03-14 DIAGNOSIS — Z961 Presence of intraocular lens: Secondary | ICD-10-CM | POA: Insufficient documentation

## 2016-03-14 DIAGNOSIS — K921 Melena: Principal | ICD-10-CM | POA: Insufficient documentation

## 2016-03-14 DIAGNOSIS — Z9889 Other specified postprocedural states: Secondary | ICD-10-CM | POA: Insufficient documentation

## 2016-03-14 DIAGNOSIS — Z86010 Personal history of colonic polyps: Secondary | ICD-10-CM | POA: Insufficient documentation

## 2016-03-14 DIAGNOSIS — Z6841 Body Mass Index (BMI) 40.0 and over, adult: Secondary | ICD-10-CM | POA: Insufficient documentation

## 2016-03-14 DIAGNOSIS — Z8249 Family history of ischemic heart disease and other diseases of the circulatory system: Secondary | ICD-10-CM | POA: Insufficient documentation

## 2016-03-14 DIAGNOSIS — E782 Mixed hyperlipidemia: Secondary | ICD-10-CM | POA: Diagnosis present

## 2016-03-14 DIAGNOSIS — F419 Anxiety disorder, unspecified: Secondary | ICD-10-CM | POA: Diagnosis not present

## 2016-03-14 DIAGNOSIS — M1990 Unspecified osteoarthritis, unspecified site: Secondary | ICD-10-CM | POA: Insufficient documentation

## 2016-03-14 HISTORY — DX: Obstructive sleep apnea (adult) (pediatric): G47.33

## 2016-03-14 HISTORY — DX: Unspecified chronic bronchitis: J42

## 2016-03-14 HISTORY — DX: Type 2 diabetes mellitus without complications: E11.9

## 2016-03-14 HISTORY — DX: Dependence on supplemental oxygen: Z99.81

## 2016-03-14 HISTORY — DX: Personal history of other medical treatment: Z92.89

## 2016-03-14 HISTORY — DX: Unspecified osteoarthritis, unspecified site: M19.90

## 2016-03-14 HISTORY — DX: Unspecified visual loss: H54.7

## 2016-03-14 HISTORY — DX: Pure hypercholesterolemia, unspecified: E78.00

## 2016-03-14 LAB — CBC WITH DIFFERENTIAL/PLATELET
Basophils Absolute: 0 10*3/uL (ref 0.0–0.1)
Basophils Relative: 0 %
EOS PCT: 2 %
Eosinophils Absolute: 0.3 10*3/uL (ref 0.0–0.7)
HCT: 35.3 % — ABNORMAL LOW (ref 36.0–46.0)
Hemoglobin: 11.4 g/dL — ABNORMAL LOW (ref 12.0–15.0)
LYMPHS ABS: 2.6 10*3/uL (ref 0.7–4.0)
LYMPHS PCT: 20 %
MCH: 27.4 pg (ref 26.0–34.0)
MCHC: 32.3 g/dL (ref 30.0–36.0)
MCV: 84.9 fL (ref 78.0–100.0)
MONO ABS: 0.6 10*3/uL (ref 0.1–1.0)
Monocytes Relative: 5 %
Neutro Abs: 9.4 10*3/uL — ABNORMAL HIGH (ref 1.7–7.7)
Neutrophils Relative %: 73 %
PLATELETS: 286 10*3/uL (ref 150–400)
RBC: 4.16 MIL/uL (ref 3.87–5.11)
RDW: 12.5 % (ref 11.5–15.5)
WBC: 12.9 10*3/uL — ABNORMAL HIGH (ref 4.0–10.5)

## 2016-03-14 LAB — COMPREHENSIVE METABOLIC PANEL
ALT: 11 U/L — AB (ref 14–54)
AST: 21 U/L (ref 15–41)
Albumin: 4.1 g/dL (ref 3.5–5.0)
Alkaline Phosphatase: 77 U/L (ref 38–126)
Anion gap: 13 (ref 5–15)
BUN: 36 mg/dL — ABNORMAL HIGH (ref 6–20)
CHLORIDE: 88 mmol/L — AB (ref 101–111)
CO2: 35 mmol/L — ABNORMAL HIGH (ref 22–32)
CREATININE: 1.38 mg/dL — AB (ref 0.44–1.00)
Calcium: 9.5 mg/dL (ref 8.9–10.3)
GFR, EST AFRICAN AMERICAN: 46 mL/min — AB (ref >60)
GFR, EST NON AFRICAN AMERICAN: 39 mL/min — AB (ref >60)
Glucose, Bld: 144 mg/dL — ABNORMAL HIGH (ref 65–99)
POTASSIUM: 2.9 mmol/L — AB (ref 3.5–5.1)
Sodium: 136 mmol/L (ref 135–145)
Total Bilirubin: 0.5 mg/dL (ref 0.3–1.2)
Total Protein: 8 g/dL (ref 6.5–8.1)

## 2016-03-14 LAB — GLUCOSE, CAPILLARY
GLUCOSE-CAPILLARY: 117 mg/dL — AB (ref 65–99)
Glucose-Capillary: 137 mg/dL — ABNORMAL HIGH (ref 65–99)

## 2016-03-14 LAB — MAGNESIUM: MAGNESIUM: 1.7 mg/dL (ref 1.7–2.4)

## 2016-03-14 LAB — MRSA PCR SCREENING: MRSA by PCR: NEGATIVE

## 2016-03-14 MED ORDER — ESCITALOPRAM OXALATE 10 MG PO TABS: 10.0000 mg | ORAL_TABLET | Freq: Every day | ORAL | Status: DC

## 2016-03-14 MED ORDER — FUROSEMIDE 40 MG PO TABS: 60.0000 mg | ORAL_TABLET | Freq: Two times a day (BID) | ORAL | Status: DC

## 2016-03-14 MED ORDER — SODIUM CHLORIDE 0.9 % IV SOLN: INTRAVENOUS | Status: DC

## 2016-03-14 MED ORDER — INSULIN ASPART 100 UNIT/ML ~~LOC~~ SOLN: 26.0000 [IU] | SUBCUTANEOUS | Status: DC

## 2016-03-14 MED ORDER — BISACODYL 5 MG PO TBEC
20.0000 mg | DELAYED_RELEASE_TABLET | Freq: Once | ORAL | Status: AC
  Administered 2016-03-14: 20 mg via ORAL
  Filled 2016-03-14: qty 4

## 2016-03-14 MED ORDER — METOLAZONE 2.5 MG PO TABS
2.5000 mg | ORAL_TABLET | Freq: Every day | ORAL | Status: DC
Start: 2016-03-15 — End: 2016-03-14

## 2016-03-14 MED ORDER — BUDESONIDE 0.5 MG/2ML IN SUSP
0.5000 mg | Freq: Every day | RESPIRATORY_TRACT | Status: DC
  Administered 2016-03-15: 0.5 mg via RESPIRATORY_TRACT
  Filled 2016-03-14: qty 2

## 2016-03-14 MED ORDER — OXYCODONE HCL 5 MG PO TABS
5.0000 mg | ORAL_TABLET | Freq: Two times a day (BID) | ORAL | Status: DC
  Administered 2016-03-14 – 2016-03-15 (×2): 5 mg via ORAL
  Filled 2016-03-14 (×2): qty 1

## 2016-03-14 MED ORDER — SODIUM CHLORIDE 0.9 % IV SOLN
INTRAVENOUS | Status: DC
  Administered 2016-03-14: 1 mL via INTRAVENOUS

## 2016-03-14 MED ORDER — BOOST / RESOURCE BREEZE PO LIQD: 1.0000 | Freq: Three times a day (TID) | ORAL | Status: DC

## 2016-03-14 MED ORDER — PRIMIDONE 50 MG PO TABS
50.0000 mg | ORAL_TABLET | Freq: Every day | ORAL | Status: DC
  Administered 2016-03-14: 50 mg via ORAL
  Filled 2016-03-14 (×2): qty 1

## 2016-03-14 MED ORDER — NITROGLYCERIN 0.4 MG SL SUBL: 0.4000 mg | SUBLINGUAL_TABLET | SUBLINGUAL | Status: DC | PRN

## 2016-03-14 MED ORDER — MUPIROCIN CALCIUM 2 % EX CREA
1.0000 "application " | TOPICAL_CREAM | Freq: Two times a day (BID) | CUTANEOUS | Status: DC
  Administered 2016-03-14 – 2016-03-15 (×2): 1 via TOPICAL
  Filled 2016-03-14: qty 15

## 2016-03-14 MED ORDER — INSULIN GLARGINE 100 UNIT/ML ~~LOC~~ SOLN
20.0000 [IU] | Freq: Every day | SUBCUTANEOUS | Status: DC
  Administered 2016-03-14: 20 [IU] via SUBCUTANEOUS
  Filled 2016-03-14 (×2): qty 0.2

## 2016-03-14 MED ORDER — POTASSIUM CHLORIDE CRYS ER 20 MEQ PO TBCR: 20.0000 meq | EXTENDED_RELEASE_TABLET | Freq: Three times a day (TID) | ORAL | Status: DC

## 2016-03-14 MED ORDER — INSULIN GLARGINE 100 UNIT/ML ~~LOC~~ SOLN
25.0000 [IU] | Freq: Every day | SUBCUTANEOUS | Status: DC
  Administered 2016-03-15: 25 [IU] via SUBCUTANEOUS
  Filled 2016-03-14: qty 0.25

## 2016-03-14 MED ORDER — MONTELUKAST SODIUM 10 MG PO TABS: 10.0000 mg | ORAL_TABLET | Freq: Every day | ORAL | Status: DC

## 2016-03-14 MED ORDER — FAMOTIDINE IN NACL 20-0.9 MG/50ML-% IV SOLN
20.0000 mg | Freq: Two times a day (BID) | INTRAVENOUS | Status: DC
  Administered 2016-03-14 – 2016-03-15 (×2): 20 mg via INTRAVENOUS
  Filled 2016-03-14 (×3): qty 50

## 2016-03-14 MED ORDER — DILTIAZEM HCL 60 MG PO TABS
30.0000 mg | ORAL_TABLET | Freq: Two times a day (BID) | ORAL | Status: DC
Start: 2016-03-14 — End: 2016-03-15
  Administered 2016-03-14 – 2016-03-15 (×2): 30 mg via ORAL
  Filled 2016-03-14 (×2): qty 1

## 2016-03-14 MED ORDER — TRIAMCINOLONE ACETONIDE 0.1 % EX CREA
1.0000 "application " | TOPICAL_CREAM | Freq: Three times a day (TID) | CUTANEOUS | Status: DC
  Administered 2016-03-14 – 2016-03-15 (×2): 1 via TOPICAL
  Filled 2016-03-14: qty 15

## 2016-03-14 MED ORDER — FENOFIBRATE 54 MG PO TABS
54.0000 mg | ORAL_TABLET | Freq: Every day | ORAL | Status: DC
Start: 2016-03-15 — End: 2016-03-14

## 2016-03-14 MED ORDER — VITAMIN E 45 MG (100 UNIT) PO CAPS: 100.0000 [IU] | ORAL_CAPSULE | Freq: Every day | ORAL | Status: DC

## 2016-03-14 MED ORDER — METOCLOPRAMIDE HCL 5 MG/ML IJ SOLN
5.0000 mg | Freq: Four times a day (QID) | INTRAMUSCULAR | Status: DC
  Administered 2016-03-14 – 2016-03-15 (×4): 5 mg via INTRAVENOUS
  Filled 2016-03-14 (×4): qty 2

## 2016-03-14 MED ORDER — ONDANSETRON 4 MG PO TBDP: 4.0000 mg | ORAL_TABLET | Freq: Two times a day (BID) | ORAL | Status: DC | PRN

## 2016-03-14 MED ORDER — INSULIN GLARGINE 100 UNIT/ML ~~LOC~~ SOLN: 40.0000 [IU] | Freq: Every day | SUBCUTANEOUS | Status: DC

## 2016-03-14 MED ORDER — NYSTATIN 100000 UNIT/GM EX POWD
Freq: Two times a day (BID) | CUTANEOUS | Status: DC
  Administered 2016-03-14: 1 via TOPICAL
  Administered 2016-03-15: 10:00:00 via TOPICAL
  Filled 2016-03-14: qty 15

## 2016-03-14 MED ORDER — POLYETHYLENE GLYCOL 3350 17 GM/SCOOP PO POWD
0.5000 | Freq: Once | ORAL | Status: AC
  Administered 2016-03-14: 127.5 g via ORAL
  Filled 2016-03-14: qty 255

## 2016-03-14 MED ORDER — POTASSIUM CHLORIDE 10 MEQ/100ML IV SOLN
10.0000 meq | INTRAVENOUS | Status: AC
  Administered 2016-03-14 – 2016-03-15 (×4): 10 meq via INTRAVENOUS
  Filled 2016-03-14 (×4): qty 100

## 2016-03-14 MED ORDER — METFORMIN HCL ER 500 MG PO TB24: 500.0000 mg | ORAL_TABLET | Freq: Two times a day (BID) | ORAL | Status: DC

## 2016-03-14 MED ORDER — IPRATROPIUM-ALBUTEROL 0.5-2.5 (3) MG/3ML IN SOLN
3.0000 mL | Freq: Four times a day (QID) | RESPIRATORY_TRACT | Status: DC
  Administered 2016-03-14 – 2016-03-15 (×2): 3 mL via RESPIRATORY_TRACT
  Filled 2016-03-14 (×2): qty 3

## 2016-03-14 MED ORDER — POLYETHYLENE GLYCOL 3350 17 GM/SCOOP PO POWD
0.5000 | Freq: Once | ORAL | Status: AC
  Administered 2016-03-15: 127.5 g via ORAL
  Filled 2016-03-14: qty 255

## 2016-03-14 MED ORDER — ATORVASTATIN CALCIUM 20 MG PO TABS: 20.0000 mg | ORAL_TABLET | Freq: Every day | ORAL | Status: DC

## 2016-03-14 MED ORDER — INSULIN ASPART 100 UNIT/ML ~~LOC~~ SOLN
0.0000 [IU] | Freq: Three times a day (TID) | SUBCUTANEOUS | Status: DC
  Administered 2016-03-14 – 2016-03-15 (×2): 2 [IU] via SUBCUTANEOUS
  Administered 2016-03-15: 3 [IU] via SUBCUTANEOUS

## 2016-03-14 MED ORDER — MODAFINIL 100 MG PO TABS
200.0000 mg | ORAL_TABLET | Freq: Every day | ORAL | Status: DC
  Administered 2016-03-15: 200 mg via ORAL
  Filled 2016-03-14: qty 2

## 2016-03-14 MED ORDER — PANTOPRAZOLE SODIUM 20 MG PO TBEC
20.0000 mg | DELAYED_RELEASE_TABLET | Freq: Two times a day (BID) | ORAL | Status: DC
  Filled 2016-03-14: qty 1

## 2016-03-14 MED ORDER — INSULIN ASPART 100 UNIT/ML ~~LOC~~ SOLN: 0.0000 [IU] | Freq: Every day | SUBCUTANEOUS | Status: DC

## 2016-03-14 MED ORDER — ALBUTEROL SULFATE (2.5 MG/3ML) 0.083% IN NEBU: 2.5000 mg | INHALATION_SOLUTION | Freq: Four times a day (QID) | RESPIRATORY_TRACT | Status: DC | PRN

## 2016-03-14 NOTE — Progress Notes (Signed)
Order for Urgent PICC placement. Spoke with primary RN to see if it was a good time to place it. The primary RN stated that there was now a PIV ands that the PICC would no longer be needed. She stated she would cancel the order for the PICC.

## 2016-03-14 NOTE — Progress Notes (Signed)
Pt. checked for home Bipap placement status which was checked earlier in shift when aerosol administered @2008 , Oxygen  line was placed to adapter that was inline, already had distilled h20 placed in humidifier, in good condition. Already had on upon my arrival placed Oxygen flowmeter up to home setting to 3 lpm from 2 lpm. tolerating well.

## 2016-03-14 NOTE — Consult Note (Signed)
Consultation Note   Denise Freeman [facility] HWE:[id number] DOB: 06/27/1950 DOA: 03/14/2016   PCP: Betty Martinique, MD   Patient coming from/Resides with: Private residence/lives with husband  Requesting M.D.: Dr Carlean Purl  Reason for consultation: Assist with management of multiple medical problems  HPI: Denise Freeman is a 65 y.o. 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 We have been asked to follow along to assist in the management of the patient's multiple medical problems.   Review of Systems:  In addition to the HPI above,  No Fever-chills, myalgias or other constitutional symptoms No Headache, changes with Vision or hearing, new weakness, tingling, numbness in any extremity, dizziness, dysarthria or word finding difficulty, gait disturbance or imbalance, tremors or seizure activity No problems swallowing food or Liquids, indigestion/reflux, choking or coughing while eating, abdominal pain with or after eating No Chest pain, Cough or Shortness of Breath, palpitations, orthopnea or DOE No Abdominal pain, N/V, constipation No dysuria, malodorous urine, hematuria or flank pain No new skin rashes, lesions, masses or bruises, No new joint pains, aches, swelling or redness No recent unintentional weight gain or loss No polyuria, polydypsia or polyphagia   Past Medical History:  Diagnosis Date  . Anemia   . Anxiety   . Asthma   . Atrial fibrillation ([place])   . Bipolar 1 disorder ([place])   . 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 CHF (congestive heart failure) ([place])   . Chronic back pain     . Chronic bronchitis ([place])   . Colon polyps   . COPD (chronic obstructive pulmonary disease) ([place])   . Coronary artery disease   . Depression   . Family history of adverse reaction to anesthesia    Uncle was positive for malignant hyperthermia; patient had testing done and was negative.  . Fibromyalgia   . GERD (gastroesophageal reflux disease)   . Gout   . High cholesterol   . History of blood transfusion 06/2015   "bleeding from my rectum"  . History of hiatal hernia   . HOH (hard of hearing)   . Myocardial infarction 1990   "mild"  . 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 On home oxygen therapy    "3L; 24/7" (03/14/2016)  . OSA treated with BiPAP    uses biPAP, 10 (03/14/2016)  . Osteoarthritis   . Pneumonia   . Shortness of breath dyspnea   . Type II diabetes mellitus ([place])     Past Surgical History:  Procedure Laterality Date  . APPENDECTOMY     "they busted"  . bladder stimulator     pt states, "it cannot be turned off; it's in my right hip; dead battery so it's not working anymore". (03/14/2016)  . BLADDER SUSPENSION     2003, 2006 and 2010  . CATARACT EXTRACTION W/PHACO Right 11/29/2014   Procedure: CATARACT EXTRACTION PHACO AND INTRAOCULAR LENS PLACEMENT (IOC);  Surgeon: Rutherford Guys, MD;  Location: [facility];  Service: Ophthalmology;  Laterality: Right;  CDE:3.81  . CATARACT EXTRACTION W/PHACO Left 12/13/2014   Procedure: CATARACT EXTRACTION PHACO AND INTRAOCULAR LENS PLACEMENT (IOC);  Surgeon: Rutherford Guys, MD;  Location: [facility];  Service: Ophthalmology;  Laterality: Left;  CDE:6.59  . CERVICAL DISC SURGERY N/A 2009   4, 6, and 7 cervical disc replaced  . HEEL SPUR SURGERY Bilateral   . HERNIA REPAIR    . I&D EXTREMITY Right 06/13/2015   Procedure: MINOR IRRIGATION AND DEBRIDEMENT EXTREMITY REMOVAL OF NAIL;  Surgeon: Daryll Brod, MD;  Location: [place];  Service: Orthopedics;  Laterality: Right;  . TUBAL LIGATION    . UMBILICAL HERNIA REPAIR      w/mesh    Social History   Social History  . Marital status: Married    Spouse name: N/A  . Number of children: 1  . Years of education: N/A   Occupational History  . house wife    Social History Main Topics  . Smoking status: Former Smoker    Packs/day: 2.00    Years: 14.00    Types: Cigarettes    Quit date: 04/12/1989  . Smokeless tobacco: Never Used  . Alcohol use No  . Drug use: No  . Sexual activity: No   Other Topics Concern  . Not on file   Social History Narrative   Married   Disabled   1 child; 96 yrs   6 pregnancies   1 child       Mobility: Without assistive devices but does utilize a scooter when required to ambulate longer distances such as when shopping Work history: Disabled   Allergies  Allergen Reactions  . Ancef [Cefazolin] Nausea And Vomiting  . Levaquin [Levofloxacin In D5w] Other (See Comments)    "afib"  . Lyrica [Pregabalin] Hives  . Tamiflu [Oseltamivir Phosphate] Other (See Comments)    "water blisters"  . Zoloft [Sertraline Hcl] Other (See Comments)    Jaw problems, jittery  . Augmentin [Amoxicillin-Pot Clavulanate] Itching  . Ciprofloxacin Itching  . Haldol [Haloperidol] Other (See Comments)    Restless leg  . Nsaids Diarrhea  . Penicillins Itching, Nausea And Vomiting and Rash    Has patient had a PCN reaction causing immediate rash, facial/tongue/throat swelling, SOB or lightheadedness with hypotension: Yes Has patient had a PCN reaction causing severe rash involving mucus membranes or skin necrosis: No Has patient had a PCN reaction that required hospitalization No Has patient had a PCN reaction occurring within the last 10 years: Yes If all of the above answers are "NO", then may proceed with Cephalosporin use.  . Topamax [Topiramate] Nausea Only  . Xarelto [Rivaroxaban]     Family History  Problem Relation Age of Onset  . Heart disease Mother   . COPD Mother   . Diabetes Mother   . Breast cancer Mother   . Heart  disease Father   . Hyperlipidemia Father   . COPD Sister   . Heart disease Sister   . Diabetes Sister   . Heart disease Maternal Grandmother      Prior to Admission medications   Medication Sig Start Date End Date Taking? Authorizing Provider  albuterol (PROVENTIL) (2.5 MG/3ML) 0.083% nebulizer solution Take 3 mLs (2.5 mg total) by nebulization every 6 (six) hours as needed for wheezing or shortness of breath. 01/30/16  Yes Chesley Mires, MD  atorvastatin (LIPITOR) 20 MG tablet Take 1 tablet (20 mg total) by mouth daily. 01/26/16  Yes Betty G Martinique, MD  budesonide (PULMICORT) 0.5 MG/2ML nebulizer solution USE 2 ML (0.5 MG TOTAL) VIA NEBULIZER TWICE A DAY 01/30/16  Yes Chesley Mires, MD  cetirizine (ZYRTEC) 10 MG tablet Take 10 mg by mouth  at bedtime.   Yes Historical Provider, MD  diltiazem (CARDIZEM) 30 MG tablet Take 1 tablet (30 mg total) by mouth 2 (two) times daily. 10/31/15  Yes Pixie Casino, MD  docusate sodium (COLACE) 100 MG capsule Take 100 mg by mouth daily.   Yes Historical Provider, MD  ergocalciferol (VITAMIN D2) 50000 units capsule Take 1 capsule by mouth every two weeks. 01/29/16  Yes Betty G Martinique, MD  escitalopram (LEXAPRO) 10 MG tablet Take 10 mg by mouth at bedtime.    Yes Historical Provider, MD  fenofibrate 54 MG tablet Take 1 tablet (54 mg total) by mouth daily. 02/28/16  Yes Philemon Kingdom, MD  fluticasone (FLONASE) 50 MCG/ACT nasal spray Place 1 spray into both nostrils daily as needed for allergies or rhinitis. 01/30/16  Yes Chesley Mires, MD  furosemide (LASIX) 40 MG tablet Take 2 tablets (80 mg total) by mouth 2 (two) times daily. Patient taking differently: Take 60 mg by mouth 2 (two) times daily.  10/31/15  Yes Pixie Casino, MD  Insulin Glargine (TOUJEO SOLOSTAR) 300 UNIT/ML SOPN Inject 40-50 Units into the skin 2 times daily at 12 noon and 4 pm. Patient taking differently: Inject 40 Units into the skin daily.  02/28/16  Yes Philemon Kingdom, MD  insulin lispro  (HUMALOG KWIKPEN) 100 UNIT/ML KiwkPen Inject 25-35 units under skin 3x a day Patient taking differently: Inject 26 Units into the skin 2 (two) times daily in the am and at bedtime.. Inject 25-35 units under skin 3x a day 11/28/15  Yes Philemon Kingdom, MD  ipratropium-albuterol (DUONEB) 0.5-2.5 (3) MG/3ML SOLN Take 3 mLs by nebulization 4 (four) times daily. 01/30/16  Yes Chesley Mires, MD  metFORMIN (GLUCOPHAGE-XR) 500 MG 24 hr tablet Take 1 tablet (500 mg total) by mouth daily with breakfast. Patient taking differently: Take 500 mg by mouth 2 (two) times daily.  02/28/16  Yes Philemon Kingdom, MD  metolazone (ZAROXOLYN) 2.5 MG tablet Take 1 tablet (2.5 mg total) by mouth daily. 10/31/15  Yes Pixie Casino, MD  mirtazapine (REMERON) 15 MG tablet Take 15 mg by mouth at bedtime.   Yes Historical Provider, MD  modafinil (PROVIGIL) 200 MG tablet Take 1 tablet (200 mg total) by mouth daily. 01/10/16  Yes Chesley Mires, MD  montelukast (SINGULAIR) 10 MG tablet Take 1 tablet (10 mg total) by mouth at bedtime. 01/30/16  Yes Chesley Mires, MD  mupirocin cream (BACTROBAN) 2 % Apply 1 application topically 2 (two) times daily. 01/26/16  Yes Betty G Martinique, MD  nitroGLYCERIN (NITROSTAT) 0.4 MG SL tablet Place 1 tablet (0.4 mg total) under the tongue every 5 (five) minutes as needed for chest pain. 10/31/15  Yes Pixie Casino, MD  nystatin (MYCOSTATIN/NYSTOP) 100000 UNIT/GM POWD Apply topically 2 (two) times daily. Apply under breasts   Yes Historical Provider, MD  oxyCODONE (OXY IR/ROXICODONE) 5 MG immediate release tablet Take 5 mg by mouth 2 (two) times daily.   Yes Historical Provider, MD  OXYGEN Inhale 3 L into the lungs continuous. 3 liters 24/7   Yes Historical Provider, MD  pantoprazole (PROTONIX) 20 MG tablet Take 1 tablet (20 mg total) by mouth 2 (two) times daily before a meal. 01/26/16  Yes Betty G Martinique, MD  potassium chloride SA (K-DUR,KLOR-CON) 20 MEQ tablet Take 1 tablet (20 mEq total) by mouth 3 (three)  times daily. 01/26/16  Yes Betty G Martinique, MD  PRESCRIPTION MEDICATION Inhale into the lungs See admin instructions. Use BIPAP every time laying down  Yes Historical Provider, MD  primidone (MYSOLINE) 50 MG tablet Take 1 tablet (50 mg total) by mouth at bedtime. 12/07/15  Yes Rebecca S Tat, DO  Probiotic Product (PROBIOTIC PO) Take 1 tablet by mouth at bedtime.   Yes Historical Provider, MD  triamcinolone lotion (KENALOG) 0.1 % Apply 1 application topically 3 (three) times daily. For up to 14 days and as needed 01/26/16  Yes Betty G Martinique, MD  VITAMIN E PO Take 1 tablet by mouth daily.   Yes Historical Provider, MD  Vitamins A & D (VITAMIN A & D PO) Take by mouth.   Yes Historical Provider, MD  ferrous sulfate 324 (65 Fe) MG TBEC Take 1 tablet by mouth daily.    Historical Provider, MD  Insulin Pen Needle 32G X 4 MM MISC Use to inject insulin 4 times daily. 02/28/16   Philemon Kingdom, MD  ondansetron (ZOFRAN-ODT) 4 MG disintegrating tablet Take 1 tablet (4 mg total) by mouth 2 (two) times daily as needed for nausea or vomiting. 09/26/15   Betty G Martinique, MD    Physical Exam: Vitals:   03/14/16 1601  BP: 126/79  Pulse: 94  Temp: 98.4 F (36.9 C)  TempSrc: Oral  SpO2: 100%  Weight: 92.3 kg (203 lb 7.8 oz)  Height: 4\' 11"  (1.499 m)      Constitutional: NAD, calm, comfortable Eyes: PERRL, lids and conjunctivae normal ENMT: Mucous membranes are moist. Posterior pharynx clear of any exudate or lesions.Edentulous Neck: normal, supple, no masses, no thyromegaly Respiratory: clear to auscultation bilaterally, no wheezing, no crackles. Normal respiratory effort. No accessory muscle use. 3 L nasal cannula oxygen in place Cardiovascular: Regular rate and rhythm, no murmurs / rubs / gallops. No lower extremity edema. 2+ pedal pulses. No carotid bruits.  Abdomen: no tenderness, no masses palpated. No hepatosplenomegaly. Bowel sounds positive.  Musculoskeletal: no clubbing / cyanosis. No joint  deformity upper and lower extremities. Good ROM, no contractures. Normal muscle tone.  Skin: no rashes, ulcers. No induration-patient does have an irregular shaped scalp lesion with associated alopecia at the most appear aspect of her head-this area is slightly reddened and has a very fine eschar over it Neurologic: CN 2-12 grossly intact. Sensation intact, DTR normal. Strength 5/5 x all 4 extremities.  Psychiatric: Normal judgment and insight. Alert and oriented x 3. Normal mood.    Labs on Admission: I have personally reviewed following labs and imaging studies  CBC: No results for input(s): WBC, NEUTROABS, HGB, HCT, MCV, PLT in the last 168 hours. Basic Metabolic Panel: No results for input(s): NA, K, CL, CO2, GLUCOSE, BUN, CREATININE, CALCIUM, MG, PHOS in the last 168 hours. GFR: CrCl cannot be calculated ([name] most recent lab result is older than the maximum 21 days allowed.). Liver Function Tests: No results for input(s): AST, ALT, ALKPHOS, BILITOT, PROT, ALBUMIN in the last 168 hours. No results for input(s): LIPASE, AMYLASE in the last 168 hours. No results for input(s): AMMONIA in the last 168 hours. Coagulation Profile: No results for input(s): INR, PROTIME in the last 168 hours. Cardiac Enzymes: No results for input(s): CKTOTAL, CKMB, CKMBINDEX, TROPONINI in the last 168 hours. BNP (last 3 results) No results for input(s): PROBNP in the last 8760 hours. HbA1C: No results for input(s): HGBA1C in the last 72 hours. CBG:  Recent Labs Lab 03/14/16 1649  GLUCAP 137*   Lipid Profile: No results for input(s): CHOL, HDL, LDLCALC, TRIG, CHOLHDL, LDLDIRECT in the last 72 hours. Thyroid Function Tests: No results  for input(s): TSH, T4TOTAL, FREET4, T3FREE, THYROIDAB in the last 72 hours. Anemia Panel: No results for input(s): VITAMINB12, FOLATE, FERRITIN, TIBC, IRON, RETICCTPCT in the last 72 hours. Urine analysis:    Component Value Date/Time   COLORURINE YELLOW  06/24/2015 2138   APPEARANCEUR CLEAR 06/24/2015 2138   LABSPEC 1.008 06/24/2015 2138   PHURINE 7.0 06/24/2015 2138   GLUCOSEU NEGATIVE 06/24/2015 2138   HGBUR NEGATIVE 06/24/2015 2138   BILIRUBINUR NEGATIVE 06/24/2015 2138   KETONESUR NEGATIVE 06/24/2015 2138   PROTEINUR NEGATIVE 06/24/2015 2138   UROBILINOGEN 1.0 02/20/2015 1855   NITRITE NEGATIVE 06/24/2015 2138   LEUKOCYTESUR NEGATIVE 06/24/2015 2138   Sepsis Labs: @LABRCNTIP (procalcitonin:4,lacticidven:4) )No results found for this or any previous visit (from the past 240 hour(s)).   Radiological Exams on Admission: No results found.   Assessment/Plan Principal Problem:   Melena -Patient has had recurrent melena with associated acute blood loss anemia that has required transfusion in the past and has prohibited her from continuing on anticoagulation for her atrial fibrillation -She has now been admitted to undergo endoscopy to determine the etiology of bleeding -Treatment recommendations at discretion of primary GI team -Patient is to undergo bowel prep this evening in anticipation for endoscopy in a.m. -Low-dose IV fluids at 50 mL per hour during bowel prep -Change oral Protonix to Pepcid 20 mg IV every 12 hours until after bowel prep and endoscopy completed  Active Problems:   Atrial fibrillation [I48.91] -Patient with history of chronic atrial fibrillation followed by [name] -Previously on Xarelto with GI bleeding as described above; prior to that had been on Coumadin and recommendation was to resume Coumadin but in setting of ongoing GI bleeding this has been deferred until after above endoscopy completed -CHADVASC = 5 -Continue Cardizem for rate control -Discussed with patient importance of anticoagulation in preventing stroke in patients with atrial fibrillation; explained how intracardiac thrombi are formed in relation to cardiac anatomy and physiology of nature fibrillation in very simple terms in both patient and  husband expressed understanding -Patient prefers Coumadin if findings on endoscopy are not prohibitive to resuming anticoagulation    Microcytic anemia -Etiology appears to be related to ongoing chronic GI bleeding as evidenced by melena -Baseline hemoglobin 10.3 -Check CBC now    Insulin dependent type 2 diabetes mellitus, uncontrolled  -Patient has brittle diabetes with episodes of hyperglycemia as well as symptomatic hypoglycemia -Since she will be NPO after midnight initially we will give 25 units Lantus in a.m. tomorrow and give 20 units Lantus tonight at hour of sleep (this is half of her home dosages) -We'll utilize moderate sliding scale insulin and make adjustments considering her home regimen (see below) once diet advanced by GI -Hemoglobin A1c -Followed by Dr. Keitha Butte in the outpatient setting -Of note did not respond to Januvia so was discontinued and metformin was stopped secondary to nausea -Home dose of Toujeo is 50 units in the a.m. and 40 units at hour of sleep -Also utilizes relatively high-dose Humalog insulin for meal coverage as follows:    20 units before breakfast    27 units before lunch    32 units before dinner  *Sliding scale of Humalog - 150-175: + 1 unit  - 176-200: + 2 units  - 201-225: + 3 units  - 226-250: + 4 units  - 251-275: + 5 units - >275: + 6 units    Chronic combined systolic and diastolic CHF, NYHA class 1  -Currently compensated -Not on ACE inhibitor secondary  to chronic kidney disease -We'll hold preadmission Lasix and Aldactone while receiving bowel prep and can consider resuming within 24 hours after procedures completed -Follow daily weights and strict intake and output    Chronic hypoxic, hypercapnic respiratory failure/ COPD with asthma -Continue oxygen at 3 L/m -Currently compensated without wheezing -Tonight's dose Singulair on hold while receiving bowel prep-recommend resume on 10/13 post procedure -Continue  preadmission DuoNeb and budesonide nebulizers    OSA (obstructive sleep apnea) -We will order CPAP in the event patient utilizes at home -Continue preadmission Provigil -Holding tonight's dose of Remeron since we'll be receiving bowel prep/NPO after MN    CKD (chronic kidney disease) stage 3, GFR 30-59 ml/min -Baseline renal function: 27/1.19 -Electrolyte panel now    Fibromyalgia/Bipolar 1 disorder/Peripheral polyneuropathy  -Continue preadmission medications: Mysoline, oxycodone but will hold hour of sleep Lexapro-can resume bedtime medications tomorrow on 10/13    HLD (hyperlipidemia) -Holding Lipitor and fenofibrate since will only be on clear liquids and will be NPO midnight      Scalp lesion -Patient gives a history of recurrent migratory maculopapular pruritic lesions over the years with apparent secondary infections due to patient's self traumatizing by scratching -Patient has a lesion on the top of her head that started in a similar fashion and has not healed despite several topical creams ordered by dermatology -?? If these are secondary to MRSA -MRSA PCR screen -If not already done consider utilizing Bactroban ointment on scalp lesion -If lesion continues not to heal properly recommend return to dermatologist for further evaluation     DVT prophylaxis: SCDs Code Status: DNR/DNI  Family Communication: Husband at bedside Disposition Plan: At discretion of primary team     Kaian Fahs L. ANP-BC [facility] Pager [phone]   If 7PM-7AM, please contact night-coverage [email or web address] Password [email or web address]  03/14/2016, 5:07 PM

## 2016-03-14 NOTE — Telephone Encounter (Signed)
Called with [facility] re[facility], needs a new order for rehab with a dx of COPD.    Unable to place new rehab order currently as pt is admitted to hospital, and her orders tab is unavailable.    Will hold message to have order placed at a later time.

## 2016-03-14 NOTE — H&P (Signed)
History and Physical  Primary Care Physician:  Betty Martinique, MD Primary Gastroenterologist:  Dr. Carlean Purl      Reason for Admission: Patient with multiple medical problems and IDA needing [place]/EGD         HPI:   Cindee Mclester is a 65 y.o. Caucasian female with multiple medical problems including anemia, near blindness, anxiety, asthma, A. fib, bipolar 1 disorder, CHF, COPD maintained on supplemental oxygen, atrial fibrillation, severe diabetes, depression, fibromyalgia, GERD, neuropathy and sleep apnea, who presented to the hospital today for further evaluation of her iron deficiency anemia and recent intermittent melena. Patient is scheduled for an EGD and colonoscopy tomorrow with Dr. Carlean Purl.  According to Dr. Celesta Aver recent office visit note from 03/01/2016 the patient has a history of upper GI bleed suspected by melena and increased even an early 2017, but left the hospital before having an endoscopy which was recommended. She was subsequently seen at [facility] and no procedures were scheduled. At time of that visit she was reporting intermittent dark and red stools, which was chronic and intermittent. She also described some periumbilical soreness where she had a umbilical hernia repair previously. At that visit it was recommended that she have an EGD and colonoscopy for further evaluation. It was recommended she undergo prep while in the hospital as previously she has had problems with this including vomiting issues. She also has fairly complicated diabetes with large insulin doses and there was discussion of the hospitalist helping with her diabetes management while in-house.  Today, the patient arrives with her husband and tells me that she is doing well and ready for her colonoscopy and EGD tomorrow. She denies any changes to her anemia or recent intermittent episodes of bloody stool.We review her medications. She denies any questions or complaints.  Patient denies  fever, chills, change in bowel habits, dizziness or syncope.  Past Medical History:  Diagnosis Date  . Anemia   . Anxiety   . Arthritis   . Asthma   . Atrial fibrillation ([place])   . Bipolar 1 disorder ([place])   . CHF (congestive heart failure) ([place])   . Colon polyps   . COPD (chronic obstructive pulmonary disease) ([place])   . Coronary artery disease   . Depression   . Diabetes mellitus without complication ([place])   . Family history of adverse reaction to anesthesia    Uncle was positive for malignant hyerthermia; patient had testing done and was negative.  . Fibromyalgia   . GERD (gastroesophageal reflux disease)   . Gout   . History of hiatal hernia   . HOH (hard of hearing)   . Myocardial infarction 1990  . 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 Oxygen deficiency    3 LPM  . Pneumonia   . Shortness of breath dyspnea   . Sleep apnea    uses biPAP, 10    Past Surgical History:  Procedure Laterality Date  . APPENDECTOMY    . bladder stimulator     pt states, "it cannot be turned off".  Marland Kitchen BLADDER SUSPENSION     2003, 2006 and 2010  . CATARACT EXTRACTION W/PHACO Right 11/29/2014   Procedure: CATARACT EXTRACTION PHACO AND INTRAOCULAR LENS PLACEMENT (IOC);  Surgeon: Rutherford Guys, MD;  Location: [facility];  Service: Ophthalmology;  Laterality: Right;  CDE:3.81  . CATARACT EXTRACTION W/PHACO Left 12/13/2014   Procedure: CATARACT EXTRACTION PHACO AND INTRAOCULAR LENS PLACEMENT (IOC);  Surgeon: Rutherford Guys, MD;  Location:  [facility];  Service: Ophthalmology;  Laterality: Left;  CDE:6.59  . CERVICAL DISC SURGERY N/A 2009   4, 6, and 7 cervical disc replaced  . HEEL SPUR SURGERY Bilateral   . I&D EXTREMITY Right 06/13/2015   Procedure: MINOR IRRIGATION AND DEBRIDEMENT EXTREMITY REMOVAL OF NAIL;  Surgeon: Daryll Brod, MD;  Location: [place];  Service: Orthopedics;  Laterality: Right;  . TUBAL LIGATION    . UMBILICAL HERNIA REPAIR      Family History  Problem Relation Age of  Onset  . Heart disease Mother   . COPD Mother   . Diabetes Mother   . Breast cancer Mother   . Heart disease Father   . Hyperlipidemia Father   . COPD Sister   . Heart disease Sister   . Diabetes Sister   . Heart disease Maternal Grandmother     Social History  Substance Use Topics  . Smoking status: Former Smoker    Packs/day: 2.00    Years: 14.00    Types: Cigarettes    Quit date: 04/12/1989  . Smokeless tobacco: Never Used  . Alcohol use No    Prior to Admission medications   Medication Sig Start Date End Date Taking? Authorizing Provider  albuterol (PROVENTIL) (2.5 MG/3ML) 0.083% nebulizer solution Take 3 mLs (2.5 mg total) by nebulization every 6 (six) hours as needed for wheezing or shortness of breath. 01/30/16   Chesley Mires, MD  atorvastatin (LIPITOR) 20 MG tablet Take 1 tablet (20 mg total) by mouth daily. 01/26/16   Betty G Martinique, MD  budesonide (PULMICORT) 0.5 MG/2ML nebulizer solution USE 2 ML (0.5 MG TOTAL) VIA NEBULIZER TWICE A DAY 01/30/16   Chesley Mires, MD  cetirizine (ZYRTEC) 10 MG tablet Take 10 mg by mouth at bedtime.    Historical Provider, MD  diltiazem (CARDIZEM) 30 MG tablet Take 1 tablet (30 mg total) by mouth 2 (two) times daily. 10/31/15   Pixie Casino, MD  docusate sodium (COLACE) 100 MG capsule Take 100 mg by mouth daily.    Historical Provider, MD  ergocalciferol (VITAMIN D2) 50000 units capsule Take 1 capsule by mouth every two weeks. 01/29/16   Betty G Martinique, MD  escitalopram (LEXAPRO) 10 MG tablet Take 10 mg by mouth at bedtime.     Historical Provider, MD  fenofibrate 54 MG tablet Take 1 tablet (54 mg total) by mouth daily. 02/28/16   Philemon Kingdom, MD  ferrous sulfate 324 (65 Fe) MG TBEC Take 1 tablet by mouth daily.    Historical Provider, MD  fluticasone (FLONASE) 50 MCG/ACT nasal spray Place 1 spray into both nostrils daily as needed for allergies or rhinitis. 01/30/16   Chesley Mires, MD  furosemide (LASIX) 40 MG tablet Take 2 tablets (80 mg  total) by mouth 2 (two) times daily. Patient taking differently: Take 60 mg by mouth 2 (two) times daily.  10/31/15   Pixie Casino, MD  Insulin Glargine (TOUJEO SOLOSTAR) 300 UNIT/ML SOPN Inject 40-50 Units into the skin 2 times daily at 12 noon and 4 pm. 02/28/16   Philemon Kingdom, MD  insulin lispro (HUMALOG KWIKPEN) 100 UNIT/ML KiwkPen Inject 25-35 units under skin 3x a day 11/28/15   Philemon Kingdom, MD  Insulin Pen Needle 32G X 4 MM MISC Use to inject insulin 4 times daily. 02/28/16   Philemon Kingdom, MD  ipratropium-albuterol (DUONEB) 0.5-2.5 (3) MG/3ML SOLN Take 3 mLs by nebulization 4 (four) times daily. 01/30/16   Chesley Mires, MD  metFORMIN (  GLUCOPHAGE-XR) 500 MG 24 hr tablet Take 1 tablet (500 mg total) by mouth daily with breakfast. Patient not taking: Reported on 03/01/2016 02/28/16   Philemon Kingdom, MD  metolazone (ZAROXOLYN) 2.5 MG tablet Take 1 tablet (2.5 mg total) by mouth daily. 10/31/15   Pixie Casino, MD  mirtazapine (REMERON) 15 MG tablet Take 15 mg by mouth at bedtime.    Historical Provider, MD  modafinil (PROVIGIL) 200 MG tablet Take 1 tablet (200 mg total) by mouth daily. 01/10/16   Chesley Mires, MD  montelukast (SINGULAIR) 10 MG tablet Take 1 tablet (10 mg total) by mouth at bedtime. 01/30/16   Chesley Mires, MD  mupirocin cream (BACTROBAN) 2 % Apply 1 application topically 2 (two) times daily. 01/26/16   Betty G Martinique, MD  nitroGLYCERIN (NITROSTAT) 0.4 MG SL tablet Place 1 tablet (0.4 mg total) under the tongue every 5 (five) minutes as needed for chest pain. 10/31/15   Pixie Casino, MD  nystatin (MYCOSTATIN/NYSTOP) 100000 UNIT/GM POWD Apply topically 2 (two) times daily. Apply under breasts    Historical Provider, MD  ondansetron (ZOFRAN-ODT) 4 MG disintegrating tablet Take 1 tablet (4 mg total) by mouth 2 (two) times daily as needed for nausea or vomiting. 09/26/15   Betty G Martinique, MD  oxyCODONE (OXY IR/ROXICODONE) 5 MG immediate release tablet Take 5 mg by mouth 2 (two)  times daily.    Historical Provider, MD  OXYGEN Inhale 3 L into the lungs continuous. 3 liters 24/7    Historical Provider, MD  pantoprazole (PROTONIX) 20 MG tablet Take 1 tablet (20 mg total) by mouth 2 (two) times daily before a meal. 01/26/16   Betty G Martinique, MD  potassium chloride SA (K-DUR,KLOR-CON) 20 MEQ tablet Take 1 tablet (20 mEq total) by mouth 3 (three) times daily. 01/26/16   Betty G Martinique, MD  PRESCRIPTION MEDICATION Inhale into the lungs See admin instructions. Use BIPAP every time laying down    Historical Provider, MD  primidone (MYSOLINE) 50 MG tablet Take 1 tablet (50 mg total) by mouth at bedtime. 12/07/15   Eustace Quail Tat, DO  Probiotic Product (PROBIOTIC PO) Take 1 tablet by mouth at bedtime.    Historical Provider, MD  triamcinolone lotion (KENALOG) 0.1 % Apply 1 application topically 3 (three) times daily. For up to 14 days and as needed 01/26/16   Betty G Martinique, MD  VITAMIN E PO Take 1 tablet by mouth daily.    Historical Provider, MD  Vitamins A & D (VITAMIN A & D PO) Take by mouth.    Historical Provider, MD    No current facility-administered medications for this encounter.    Current Outpatient Prescriptions  Medication Sig Dispense Refill  . albuterol (PROVENTIL) (2.5 MG/3ML) 0.083% nebulizer solution Take 3 mLs (2.5 mg total) by nebulization every 6 (six) hours as needed for wheezing or shortness of breath. 1350 mL 3  . atorvastatin (LIPITOR) 20 MG tablet Take 1 tablet (20 mg total) by mouth daily. 90 tablet 3  . budesonide (PULMICORT) 0.5 MG/2ML nebulizer solution USE 2 ML (0.5 MG TOTAL) VIA NEBULIZER TWICE A DAY 360 mL 3  . cetirizine (ZYRTEC) 10 MG tablet Take 10 mg by mouth at bedtime.    Marland Kitchen diltiazem (CARDIZEM) 30 MG tablet Take 1 tablet (30 mg total) by mouth 2 (two) times daily. 180 tablet 3  . docusate sodium (COLACE) 100 MG capsule Take 100 mg by mouth daily.    . ergocalciferol (VITAMIN D2) 50000 units capsule  Take 1 capsule by mouth every two weeks. 7  capsule 0  . escitalopram (LEXAPRO) 10 MG tablet Take 10 mg by mouth at bedtime.     . fenofibrate 54 MG tablet Take 1 tablet (54 mg total) by mouth daily. 90 tablet 3  . ferrous sulfate 324 (65 Fe) MG TBEC Take 1 tablet by mouth daily.    . fluticasone (FLONASE) 50 MCG/ACT nasal spray Place 1 spray into both nostrils daily as needed for allergies or rhinitis. 32 g 1  . furosemide (LASIX) 40 MG tablet Take 2 tablets (80 mg total) by mouth 2 (two) times daily. (Patient taking differently: Take 60 mg by mouth 2 (two) times daily. ) 360 tablet 3  . Insulin Glargine (TOUJEO SOLOSTAR) 300 UNIT/ML SOPN Inject 40-50 Units into the skin 2 times daily at 12 noon and 4 pm. 18 pen 1  . insulin lispro (HUMALOG KWIKPEN) 100 UNIT/ML KiwkPen Inject 25-35 units under skin 3x a day 35 pen 1  . Insulin Pen Needle 32G X 4 MM MISC Use to inject insulin 4 times daily. 400 each 3  . ipratropium-albuterol (DUONEB) 0.5-2.5 (3) MG/3ML SOLN Take 3 mLs by nebulization 4 (four) times daily. 1080 mL 1  . metFORMIN (GLUCOPHAGE-XR) 500 MG 24 hr tablet Take 1 tablet (500 mg total) by mouth daily with breakfast. (Patient not taking: Reported on 03/01/2016) 180 tablet 3  . metolazone (ZAROXOLYN) 2.5 MG tablet Take 1 tablet (2.5 mg total) by mouth daily. 90 tablet 3  . mirtazapine (REMERON) 15 MG tablet Take 15 mg by mouth at bedtime.    . modafinil (PROVIGIL) 200 MG tablet Take 1 tablet (200 mg total) by mouth daily. 90 tablet 0  . montelukast (SINGULAIR) 10 MG tablet Take 1 tablet (10 mg total) by mouth at bedtime. 90 tablet 3  . mupirocin cream (BACTROBAN) 2 % Apply 1 application topically 2 (two) times daily. 15 g 0  . nitroGLYCERIN (NITROSTAT) 0.4 MG SL tablet Place 1 tablet (0.4 mg total) under the tongue every 5 (five) minutes as needed for chest pain. 25 tablet 3  . nystatin (MYCOSTATIN/NYSTOP) 100000 UNIT/GM POWD Apply topically 2 (two) times daily. Apply under breasts    . ondansetron (ZOFRAN-ODT) 4 MG disintegrating tablet  Take 1 tablet (4 mg total) by mouth 2 (two) times daily as needed for nausea or vomiting. 30 tablet 1  . oxyCODONE (OXY IR/ROXICODONE) 5 MG immediate release tablet Take 5 mg by mouth 2 (two) times daily.    . OXYGEN Inhale 3 L into the lungs continuous. 3 liters 24/7    . pantoprazole (PROTONIX) 20 MG tablet Take 1 tablet (20 mg total) by mouth 2 (two) times daily before a meal. 180 tablet 1  . potassium chloride SA (K-DUR,KLOR-CON) 20 MEQ tablet Take 1 tablet (20 mEq total) by mouth 3 (three) times daily. 270 tablet 0  . PRESCRIPTION MEDICATION Inhale into the lungs See admin instructions. Use BIPAP every time laying down    . primidone (MYSOLINE) 50 MG tablet Take 1 tablet (50 mg total) by mouth at bedtime. 90 tablet 1  . Probiotic Product (PROBIOTIC PO) Take 1 tablet by mouth at bedtime.    . triamcinolone lotion (KENALOG) 0.1 % Apply 1 application topically 3 (three) times daily. For up to 14 days and as needed 60 mL 1  . VITAMIN E PO Take 1 tablet by mouth daily.    . Vitamins A & D (VITAMIN A & D PO) Take by mouth.  Allergies as of 03/01/2016 - Review Complete 03/01/2016  Allergen Reaction Noted  . Ancef [cefazolin] Nausea And Vomiting 04/12/2014  . Levaquin [levofloxacin in d5w] Other (See Comments) 02/05/2014  . Lyrica [pregabalin] Hives 02/20/2015  . Tamiflu [oseltamivir phosphate] Other (See Comments) 04/12/2014  . Zoloft [sertraline hcl] Other (See Comments) 02/05/2014  . Augmentin [amoxicillin-pot clavulanate] Itching 02/05/2014  . Ciprofloxacin Itching 02/05/2014  . Haldol [haloperidol] Other (See Comments) 02/05/2014  . Nsaids Diarrhea 05/13/2014  . Penicillins Itching, Nausea And Vomiting, and Rash 06/24/2015  . Topamax [topiramate] Nausea Only 04/04/2015  . Xarelto [rivaroxaban]  03/01/2016     Review of Systems:     Constitutional: No weight loss, fever, chills, weakness or fatigue HEENT: Eyes: No change in vision               Ears, Nose, Throat:  No change in  hearing or congestion Skin: No rash or itching Cardiovascular: No chest pain, chest pressure or palpitations   Respiratory: No SOB or cough Gastrointestinal: See HPI and otherwise negative Genitourinary: No dysuria or change in urinary frequency Neurological: No headache, dizziness or syncope Musculoskeletal: No new muscle or joint pain Hematologic: No bleeding or bruising Psychiatric: No history of depression or anxiety    Physical Exam:  Vital signs in last 24 hours:    Currently being taken by nurse  General:   Pleasant Caucasian female appears to be in NAD, Well developed, Well nourished, alert and cooperative Head:  Normocephalic and atraumatic. Eyes:   PEERL, EOMI. No icterus. Conjunctiva pink. Ears:  Normal auditory acuity. Neck:  Supple Throat: Oral cavity and pharynx without inflammation, swelling or lesion. Lungs: Respirations even and unlabored. Lungs clear to auscultation bilaterally.   No wheezes, crackles, or rhonchi. On 3L o2 via [place] Heart: Normal S1, S2. No MRG. Regular rate and rhythm. No peripheral edema, cyanosis or pallor.  Abdomen:  Soft, nondistended, nontender. No rebound or guarding. Normal bowel sounds. No appreciable masses or hepatomegaly. Rectal:  Not performed.  Msk:  Symmetrical without gross deformities. Patient ambulates with a scooter because of bilateral knee pain Extremities:  Without edema, no deformity or joint abnormality.  Neurologic:  Alert and  oriented x4;  grossly normal neurologically.  Skin:   Dry and intact without significant lesions or rashes. Psychiatric: Oriented to person, place and time. Demonstrates good judgement and reason without abnormal affect or behaviors.   MOST RECENT LAB RESULTS: Results for KEI, LANGHORST (MRN [id number]) as of 03/14/2016 14:57  Ref. Range 01/26/2016 10:58  Sodium Latest Ref Range: 135 - 145 mEq/L 140  Potassium Latest Ref Range: 3.5 - 5.1 mEq/L 3.6  Chloride Latest Ref Range: 96 - 112 mEq/L 92  (L)  CO2 Latest Ref Range: 19 - 32 mEq/L 38 (H)  BUN Latest Ref Range: 6 - 23 mg/dL 27 (H)  Creatinine Latest Ref Range: 0.40 - 1.20 mg/dL 1.19  Calcium Latest Ref Range: 8.4 - 10.5 mg/dL 8.9  Glucose Latest Ref Range: 70 - 99 mg/dL 224 (H)  GFR Latest Ref Range: >60.00 mL/min 48.41 (L)  Ferritin Latest Ref Range: 10.0 - 291.0 ng/mL 43.8  WBC Latest Ref Range: 4.0 - 10.5 K/uL 11.5 (H)  RBC Latest Ref Range: 3.87 - 5.11 Mil/uL 3.98  Hemoglobin Latest Ref Range: 12.0 - 15.0 g/dL 10.9 (L)  HCT Latest Ref Range: 36.0 - 46.0 % 32.5 (L)  MCV Latest Ref Range: 78.0 - 100.0 fl 81.7  MCHC Latest Ref Range: 30.0 - 36.0 g/dL 33.5  RDW Latest Ref Range: 11.5 - 15.5 % 13.7  Platelets Latest Ref Range: 150.0 - 400.0 K/uL 313.0  VITD Latest Ref Range: 30.00 - 100.00 ng/mL 29.71 (L)     Impression / Plan:   Impression: 1. Blood in stool: Patient describes melena early in 2017 which she never had a workup for, this is been intermittent and chronic since that time, she was on Xarelto which was stopped at that time, still anemic 2. Chronic anemia: As above 3. Complicated DM  Plan: 1. Patient is scheduled for EGD and colonoscopy with Dr. Carlean Purl tomorrow for further evaluation. Risks, benefits, limitations and alternatives have been discussed with her and she agrees to proceed. 2. Patient remain on clear liquid diet today and nothing by mouth after midnight 3. Prep has been ordered as well as IV Reglan every 6 hours while she is on the prep. 4. Will ask hospitalist to assist with her diabetes management while in house. 5. Will discuss above with Dr. Carlean Purl, please await any further recommendations.  Lavone Nian Salayah Meares  03/14/2016, 2:48 PM Pager #: [phone]

## 2016-03-15 ENCOUNTER — Observation Stay (HOSPITAL_COMMUNITY): Payer: Medicare Other | Admitting: Certified Registered"

## 2016-03-15 ENCOUNTER — Encounter (HOSPITAL_COMMUNITY): Payer: Self-pay | Admitting: *Deleted

## 2016-03-15 ENCOUNTER — Other Ambulatory Visit: Payer: Self-pay | Admitting: Physician Assistant

## 2016-03-15 ENCOUNTER — Encounter (HOSPITAL_COMMUNITY)
Admission: RE | Disposition: A | Discharge status: Home / Self Care | Payer: Self-pay | Source: Ambulatory Visit | Attending: Internal Medicine

## 2016-03-15 DIAGNOSIS — D649 Anemia, unspecified: Secondary | ICD-10-CM | POA: Diagnosis not present

## 2016-03-15 DIAGNOSIS — F419 Anxiety disorder, unspecified: Secondary | ICD-10-CM | POA: Diagnosis not present

## 2016-03-15 DIAGNOSIS — F319 Bipolar disorder, unspecified: Secondary | ICD-10-CM

## 2016-03-15 DIAGNOSIS — D120 Benign neoplasm of cecum: Secondary | ICD-10-CM

## 2016-03-15 DIAGNOSIS — K921 Melena: Principal | ICD-10-CM

## 2016-03-15 DIAGNOSIS — E139 Other specified diabetes mellitus without complications: Secondary | ICD-10-CM | POA: Diagnosis not present

## 2016-03-15 DIAGNOSIS — D128 Benign neoplasm of rectum: Secondary | ICD-10-CM | POA: Diagnosis not present

## 2016-03-15 DIAGNOSIS — R195 Other fecal abnormalities: Secondary | ICD-10-CM | POA: Diagnosis not present

## 2016-03-15 DIAGNOSIS — E1122 Type 2 diabetes mellitus with diabetic chronic kidney disease: Secondary | ICD-10-CM | POA: Diagnosis not present

## 2016-03-15 DIAGNOSIS — K621 Rectal polyp: Secondary | ICD-10-CM | POA: Diagnosis not present

## 2016-03-15 DIAGNOSIS — E876 Hypokalemia: Secondary | ICD-10-CM

## 2016-03-15 DIAGNOSIS — K219 Gastro-esophageal reflux disease without esophagitis: Secondary | ICD-10-CM | POA: Diagnosis not present

## 2016-03-15 DIAGNOSIS — M797 Fibromyalgia: Secondary | ICD-10-CM | POA: Diagnosis not present

## 2016-03-15 DIAGNOSIS — D62 Acute posthemorrhagic anemia: Secondary | ICD-10-CM | POA: Diagnosis not present

## 2016-03-15 DIAGNOSIS — D122 Benign neoplasm of ascending colon: Secondary | ICD-10-CM | POA: Diagnosis not present

## 2016-03-15 DIAGNOSIS — J45909 Unspecified asthma, uncomplicated: Secondary | ICD-10-CM | POA: Diagnosis not present

## 2016-03-15 DIAGNOSIS — N183 Chronic kidney disease, stage 3 (moderate): Secondary | ICD-10-CM | POA: Diagnosis not present

## 2016-03-15 DIAGNOSIS — Z794 Long term (current) use of insulin: Secondary | ICD-10-CM | POA: Diagnosis not present

## 2016-03-15 DIAGNOSIS — I2510 Atherosclerotic heart disease of native coronary artery without angina pectoris: Secondary | ICD-10-CM | POA: Diagnosis not present

## 2016-03-15 DIAGNOSIS — I482 Chronic atrial fibrillation: Secondary | ICD-10-CM | POA: Diagnosis not present

## 2016-03-15 HISTORY — PX: ESOPHAGOGASTRODUODENOSCOPY (EGD) WITH PROPOFOL: SHX5813

## 2016-03-15 HISTORY — PX: COLONOSCOPY WITH PROPOFOL: SHX5780

## 2016-03-15 LAB — GLUCOSE, CAPILLARY
GLUCOSE-CAPILLARY: 149 mg/dL — AB (ref 65–99)
Glucose-Capillary: 176 mg/dL — ABNORMAL HIGH (ref 65–99)

## 2016-03-15 LAB — HEMOGLOBIN A1C
Hgb A1c MFr Bld: 8.9 % — ABNORMAL HIGH (ref 4.8–5.6)
MEAN PLASMA GLUCOSE: 209 mg/dL

## 2016-03-15 SURGERY — ESOPHAGOGASTRODUODENOSCOPY (EGD) WITH PROPOFOL
Anesthesia: Monitor Anesthesia Care

## 2016-03-15 MED ORDER — POTASSIUM CHLORIDE ER 10 MEQ PO TBCR: 20.0000 meq | EXTENDED_RELEASE_TABLET | Freq: Every day | ORAL | 0 refills | Status: DC

## 2016-03-15 MED ORDER — ALBUTEROL SULFATE (2.5 MG/3ML) 0.083% IN NEBU: 2.5000 mg | INHALATION_SOLUTION | RESPIRATORY_TRACT | Status: DC | PRN

## 2016-03-15 MED ORDER — PROPOFOL 500 MG/50ML IV EMUL
INTRAVENOUS | Status: DC | PRN
  Administered 2016-03-15: 100 ug/kg/min via INTRAVENOUS

## 2016-03-15 MED ORDER — POTASSIUM CHLORIDE CRYS ER 20 MEQ PO TBCR
40.0000 meq | EXTENDED_RELEASE_TABLET | Freq: Three times a day (TID) | ORAL | Status: DC
  Administered 2016-03-15: 40 meq via ORAL
  Filled 2016-03-15: qty 2

## 2016-03-15 MED ORDER — SODIUM CHLORIDE 0.9 % IV SOLN
INTRAVENOUS | Status: DC
  Administered 2016-03-15: 11:00:00 via INTRAVENOUS
  Filled 2016-03-15 (×2): qty 1000

## 2016-03-15 MED ORDER — PANTOPRAZOLE SODIUM 40 MG PO TBEC: 40.0000 mg | DELAYED_RELEASE_TABLET | Freq: Two times a day (BID) | ORAL | Status: DC

## 2016-03-15 MED ORDER — LACTATED RINGERS IV SOLN
INTRAVENOUS | Status: DC
  Administered 2016-03-15: 13:00:00 via INTRAVENOUS

## 2016-03-15 MED ORDER — IPRATROPIUM-ALBUTEROL 0.5-2.5 (3) MG/3ML IN SOLN: 3.0000 mL | Freq: Three times a day (TID) | RESPIRATORY_TRACT | Status: DC

## 2016-03-15 NOTE — Progress Notes (Signed)
Inpatient Diabetes Program Recommendations  AACE/ADA: New Consensus Statement on Inpatient Glycemic Control (2015)  Target Ranges:  Prepandial:   less than 140 mg/dL      Peak postprandial:   less than 180 mg/dL (1-2 hours)      Critically ill patients:  140 - 180 mg/dL   Lab Results  Component Value Date   GLUCAP 149 (H) 03/15/2016   HGBA1C 8.9 (H) 03/14/2016    Review of Glycemic Control  Results for APOLONIA, ELLWOOD (MRN [id number]) as of 03/15/2016 10:16  Ref. Range 06/26/2015 12:06 06/26/2015 12:34 03/14/2016 16:49 03/14/2016 20:44 03/15/2016 07:38  Glucose-Capillary Latest Ref Range: 65 - 99 mg/dL 159 (H) 152 (H) 137 (H) 117 (H) 149 (H)    Diabetes history:Type 2 Outpatient Diabetes medications: Toujeo 40 units qam, Toujeo 50 units qhs, Humalog 26 units q breakfast, 28 units at lunch and 28 units pre-supper- confirmed with patient Current orders for Inpatient glycemic control: Lantus 25 units qam, Lantus 20 units qpm, Novolog 0-15 units tid, Novolog 0-5 units qhs  Agree with current medications for blood sugar management.   Inpatient Diabetes Program Recommendations:  Spoke to patient at the bedside regarding home medications. Consult from MD noted.  Patient confirms she is taking diabetes medication as listed above- She reports she uses her insulins via insulin pen and rotates between her abdomen, legs and arms.   Patient currently NPO.   Gentry Fitz, RN, BA, MHA, CDE Diabetes Coordinator Inpatient Diabetes Program  [phone] (Team Pager) [phone] ([place]) 03/15/2016 10:21 AM

## 2016-03-15 NOTE — Care Management Obs Status (Signed)
[place] NOTIFICATION   Patient Details  Name: Denise Freeman MRN: [id number] Date of Birth: 14-Jan-1951   Medicare Observation Status Notification Given:  Yes    Marilu Favre, RN 03/15/2016, 10:07 AM

## 2016-03-15 NOTE — Anesthesia Postprocedure Evaluation (Signed)
Anesthesia Post Note  Patient: Denise Freeman  Procedure(s) Performed: Procedure(s) (LRB): ESOPHAGOGASTRODUODENOSCOPY (EGD) WITH PROPOFOL (N/A) COLONOSCOPY WITH PROPOFOL (N/A)  Patient location during evaluation: PACU Anesthesia Type: MAC Level of consciousness: awake and alert Pain management: pain level controlled Vital Signs Assessment: post-procedure vital signs reviewed and stable Respiratory status: spontaneous breathing, nonlabored ventilation, respiratory function stable and patient connected to nasal cannula oxygen Cardiovascular status: stable and blood pressure returned to baseline Anesthetic complications: no    Last Vitals:  Vitals:   03/15/16 1420 03/15/16 1454  BP: (!) 106/59 133/75  Pulse: 77 65  Resp: 18   Temp:  36.4 C    Last Pain:  Vitals:   03/15/16 1454  TempSrc: Oral  PainSc:                  [name] S

## 2016-03-15 NOTE — Op Note (Signed)
[facility] Patient Name: Denise Freeman Procedure Date : 03/15/2016 MRN: [id number] Attending MD: Gatha Mayer , MD Date of Birth: November 20, 1950 CSN: [id number] Age: 65 Admit Type: Inpatient Procedure:                Colonoscopy Indications:              Melena Providers:                Gatha Mayer, MD, Vista Lawman, RN, Corliss Parish, Technician Referring MD:              Medicines:                Monitored Anesthesia Care Complications:            No immediate complications. Estimated Blood Loss:     Estimated blood loss was minimal. Procedure:                Pre-Anesthesia Assessment:                           - Prior to the procedure, a History and Physical                            was performed, and patient medications and                            allergies were reviewed. The patient's tolerance of                            previous anesthesia was also reviewed. The risks                            and benefits of the procedure and the sedation                            options and risks were discussed with the patient.                            All questions were answered, and informed consent                            was obtained. Prior Anticoagulants: The patient has                            taken no previous anticoagulant or antiplatelet                            agents. ASA Grade Assessment: IV - A patient with                            severe systemic disease that is a constant threat  to life. After reviewing the risks and benefits,                            the patient was deemed in satisfactory condition to                            undergo the procedure.                           - Prior to the procedure, a History and Physical                            was performed, and patient medications and                            allergies were reviewed. The patient's tolerance of                             previous anesthesia was also reviewed. The risks                            and benefits of the procedure and the sedation                            options and risks were discussed with the patient.                            All questions were answered, and informed consent                            was obtained. Prior Anticoagulants: The patient has                            taken no previous anticoagulant or antiplatelet                            agents. ASA Grade Assessment: IV - A patient with                            severe systemic disease that is a constant threat                            to life. After reviewing the risks and benefits,                            the patient was deemed in satisfactory condition to                            undergo the procedure.                           After obtaining informed consent, the colonoscope  was passed under direct vision. Throughout the                            procedure, the patient's blood pressure, pulse, and                            oxygen saturations were monitored continuously. The                            EC-[id number] ([id number]) scope was introduced through                            the anus and advanced to the the cecum, identified                            by appendiceal orifice and ileocecal valve. The                            colonoscopy was performed without difficulty. The                            patient tolerated the procedure well. The quality                            of the bowel preparation was good. The bowel                            preparation used was Miralax. The ileocecal valve,                            appendiceal orifice, and rectum were photographed. Scope In: 1:40:23 PM Scope Out: 1:59:39 PM Scope Withdrawal Time: 0 hours 13 minutes 37 seconds  Total Procedure Duration: 0 hours 19 minutes 16 seconds  Findings:      The perianal and digital  rectal examinations were normal.      A 3 mm polyp was found in the cecum. The polyp was sessile. The polyp       was removed with a cold biopsy forceps. Resection and retrieval were       complete. Verification of patient identification for the specimen was       done. Estimated blood loss was minimal.      Two sessile polyps were found in the rectum. The polyps were 3 to 5 mm       in size. These polyps were removed with a cold snare. Resection and       retrieval were complete. Verification of patient identification for the       specimen was done. Estimated blood loss was minimal.      The exam was otherwise without abnormality on direct and retroflexion       views. Impression:               - One 3 mm polyp in the cecum, removed with a cold                            biopsy forceps. Resected and  retrieved.                           - Two 3 to 5 mm polyps in the rectum, removed with                            a cold snare. Resected and retrieved.                           - The examination was otherwise normal on direct                            and retroflexion views. Moderate Sedation:      Please see anesthesia notes, moderate sedation not given Recommendation:           - Patient has a contact number available for                            emergencies. The signs and symptoms of potential                            delayed complications were discussed with the                            patient. Return to normal activities tomorrow.                            Written discharge instructions were provided to the                            patient.                           - Resume previous diet.                           - Continue present medications.                           - No repeat colonoscopy due to comorbidities.                           - Return to my office PRN.                           -                           NO CAUSE OF MELENA SEEN - THAT OCCURRED WHEN SHE                             WAS ON XARELTO - ? TRANSIENT ULCER/OTHER PROCESS                           SHE SHOULD RETURN TO CARDIOLOGIST ABOUT RETRYING  ANTI-COAGULATION TX FOR AFIB                           EGD JUST BEFORE THIS WAS NEGATIVE                           WILL CC: DRS BETTY Martinique AND KENNETH HILTY Procedure Code(s):        --- Professional ---                           [phone], Colonoscopy, flexible; with removal of                            tumor(s), polyp(s), or other lesion(s) by snare                            technique                           45380, 58, Colonoscopy, flexible; with biopsy,                            single or multiple Diagnosis Code(s):        --- Professional ---                           D12.0, Benign neoplasm of cecum                           K62.1, Rectal polyp                           K92.1, Melena (includes Hematochezia) CPT copyright 2016 American [employer]. All rights reserved. The codes documented in this report are preliminary and upon coder review may  be revised to meet current compliance requirements. Gatha Mayer, MD 03/15/2016 2:16:35 PM This report has been signed electronically. Number of Addenda: 0

## 2016-03-15 NOTE — Discharge Summary (Signed)
Discharge Summary:  Name: Denise Freeman MRN: [id number] DOB: 06-21-1950 65 y.o. PCP:  Betty Martinique, MD  Date of Admission: 03/14/2016  2:53 PM Date of Discharge: 03/15/2016 Attending Physician: Gatha Mayer, MD  Admitting Dignosis: Principal Problem:   Melena Active Problems:   Atrial fibrillation [I48.91]   Fibromyalgia   Bipolar 1 disorder (HCC)   Chronic combined systolic and diastolic CHF, NYHA class 1 (HCC)   Chronic hypoxic, hypercapnic respiratory failure   Microcytic anemia   OSA (obstructive sleep apnea)   COPD with asthma ([place])   Peripheral polyneuropathy (HCC)   Insulin dependent type 2 diabetes mellitus, uncontrolled (HCC)   CKD (chronic kidney disease) stage 3, GFR 30-59 ml/min   HLD (hyperlipidemia)   Scalp lesion   Blood in stool   Benign neoplasm of cecum   Benign neoplasm of rectum   Discharge Diagnosis: Principal Problem:   Melena Active Problems:   Atrial fibrillation [I48.91]   Fibromyalgia   Bipolar 1 disorder (HCC)   Chronic combined systolic and diastolic CHF, NYHA class 1 (HCC)   Chronic hypoxic, hypercapnic respiratory failure   Microcytic anemia   OSA (obstructive sleep apnea)   COPD with asthma (HCC)   Peripheral polyneuropathy (HCC)   Insulin dependent type 2 diabetes mellitus, uncontrolled (HCC)   CKD (chronic kidney disease) stage 3, GFR 30-59 ml/min   HLD (hyperlipidemia)   Scalp lesion   Blood in stool   Benign neoplasm of cecum   Benign neoplasm of rectum   Previous Medical/Surgical history Past Medical History:  Diagnosis Date  . Anemia   . Anxiety   . Asthma   . Atrial fibrillation ([place])   . Bipolar 1 disorder ([place])   . 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 CHF (congestive heart failure) ([place])   . Chronic back  pain   . Chronic bronchitis ([place])   . Colon polyps   . Complication of anesthesia    postop nausea/vomiting  . COPD (chronic obstructive pulmonary disease) ([place])   . Coronary artery disease   . Depression   . Family history of adverse reaction to anesthesia    Uncle was positive for malignant hyperthermia; patient had testing done and was negative.  . Fibromyalgia   . GERD (gastroesophageal reflux disease)   . Gout   . High cholesterol   . History of blood transfusion 06/2015   "bleeding from my rectum"  . History of hiatal hernia   . HOH (hard of hearing)   . Myocardial infarction 1990   "mild"  . 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 On home oxygen therapy    "3L; 24/7" (03/14/2016)  . OSA treated with BiPAP    uses biPAP, 10 (03/14/2016)  . Osteoarthritis   . Pneumonia   . Shortness of breath dyspnea   . Type II diabetes mellitus ([place])    Past Surgical History:  Procedure Laterality Date  . APPENDECTOMY     "they busted"  . bladder stimulator     pt states, "it cannot be turned off; it's in  my right hip; dead battery so it's not working anymore". (03/14/2016)  . BLADDER SUSPENSION     2003, 2006 and 2010  . CATARACT EXTRACTION W/PHACO Right 11/29/2014   Procedure: CATARACT EXTRACTION PHACO AND INTRAOCULAR LENS PLACEMENT (IOC);  Surgeon: Rutherford Guys, MD;  Location: [facility];  Service: Ophthalmology;  Laterality: Right;  CDE:3.81  . CATARACT EXTRACTION W/PHACO Left 12/13/2014   Procedure: CATARACT EXTRACTION PHACO AND INTRAOCULAR LENS PLACEMENT (IOC);  Surgeon: Rutherford Guys, MD;  Location: [facility];  Service: Ophthalmology;  Laterality: Left;  CDE:6.59  . CERVICAL DISC SURGERY N/A 2009   4, 6, and 7 cervical disc replaced  . HEEL SPUR SURGERY Bilateral   . HERNIA REPAIR    . I&D EXTREMITY Right 06/13/2015   Procedure: MINOR IRRIGATION AND DEBRIDEMENT EXTREMITY REMOVAL OF NAIL;  Surgeon: Daryll Brod, MD;  Location: [place];  Service: Orthopedics;  Laterality:  Right;  . TUBAL LIGATION    . UMBILICAL HERNIA REPAIR     w/mesh    Brief History: Patient was a planned admission, the day before her planned upper endoscopy and colonoscopy. She has multiple medical problems including near blindness, oxygen maintained COPD, A. fib, severe diabetes, near blindness, sleep apnea.  Melena had occurred in early 2017 but she never had workup. Because of the melena as well as anemia, Xarelto was discontinued.  She had been evaluated by Dr. Carlean Purl in the office 9/29 because of iron deficiency anemia and intermittent melena. He set her up for EGD and colonoscopy on 10/13. Because of her significant physical and medical problems he arranged for her to come in the afternoon before the procedure in order to start her bowel prep. She had also had issues with vomiting.    Hospital Course by problem list: Intermittent melena with anemia.  Colonoscopy and upper endoscopy were performed. The EGD was normal.  At colonoscopy a 3 mm polyp removed from the cecum. 23-5 mm rectal polyps removed. Cold snare method used to remove all polyps. Diverticulosis or other findings on colonoscopy other than the polyps.   Insulin requiring diabetes. Hospitalist service, Dr. Reyne Dumas, was gracious in managing her diabetes during her overnight admission.  Atrial fibrillation. Xarelto had been discontinued earlier this year because of the anemia and GI bleeding. Dr. Carlean Purl, after having performed the EGD and colon, feels that she may be able to restart Xarelto. He is advised patient to make an appointment with [name], her cardiologist, to discuss anticoagulation.  *  Hypokalemia. Patient takes regular doses of potassium chloride, 20 mEq 3 times daily. Her potassium was low during this admission.  This was treated with 4 runs of IV potassium chloride along with oral potassium. Dr. Carlean Purl advising patient to take an extra 20 mEq of potassium daily for the next 3 days. I placed orders for a  potassium level to be obtained at the [facility] lab on Monday or Tuesday of next week, 10/16 or 10/17     Wt Readings from Last 1 Encounters:  03/15/16 92.5 kg (204 lb)    Discharge Medications:   Medication List    TAKE these medications   albuterol (2.5 MG/3ML) 0.083% nebulizer solution Commonly known as:  PROVENTIL Take 3 mLs (2.5 mg total) by nebulization every 6 (six) hours as needed for wheezing or shortness of breath.   atorvastatin 20 MG tablet Commonly known as:  LIPITOR Take 1 tablet (20 mg total) by mouth daily.   budesonide 0.5 MG/2ML nebulizer solution Commonly  known as:  PULMICORT USE 2 ML (0.5 MG TOTAL) VIA NEBULIZER TWICE A DAY   cetirizine 10 MG tablet Commonly known as:  ZYRTEC Take 10 mg by mouth at bedtime.   diltiazem 30 MG tablet Commonly known as:  CARDIZEM Take 1 tablet (30 mg total) by mouth 2 (two) times daily.   docusate sodium 100 MG capsule Commonly known as:  COLACE Take 100 mg by mouth daily.   ergocalciferol 50000 units capsule Commonly known as:  VITAMIN D2 Take 1 capsule by mouth every two weeks.   escitalopram 10 MG tablet Commonly known as:  LEXAPRO Take 10 mg by mouth at bedtime.   fenofibrate 54 MG tablet Take 1 tablet (54 mg total) by mouth daily.   ferrous sulfate 324 (65 Fe) MG Tbec Take 1 tablet by mouth daily.   fluticasone 50 MCG/ACT nasal spray Commonly known as:  FLONASE Place 1 spray into both nostrils daily as needed for allergies or rhinitis.   furosemide 40 MG tablet Commonly known as:  LASIX Take 2 tablets (80 mg total) by mouth 2 (two) times daily. What changed:  how much to take   Insulin Glargine 300 UNIT/ML Sopn Commonly known as:  TOUJEO SOLOSTAR Inject 40-50 Units into the skin 2 times daily at 12 noon and 4 pm. What changed:  when to take this  additional instructions   insulin lispro 100 UNIT/ML KiwkPen Commonly known as:  HUMALOG KWIKPEN Inject 25-35 units under skin 3x a  day What changed:  how much to take  how to take this  when to take this  additional instructions   Insulin Pen Needle 32G X 4 MM Misc Use to inject insulin 4 times daily.   ipratropium-albuterol 0.5-2.5 (3) MG/3ML Soln Commonly known as:  DUONEB Take 3 mLs by nebulization 4 (four) times daily.   metFORMIN 500 MG 24 hr tablet Commonly known as:  GLUCOPHAGE-XR Take 1 tablet (500 mg total) by mouth daily with breakfast. What changed:  when to take this   metolazone 2.5 MG tablet Commonly known as:  ZAROXOLYN Take 1 tablet (2.5 mg total) by mouth daily.   mirtazapine 15 MG tablet Commonly known as:  REMERON Take 15 mg by mouth at bedtime.   modafinil 200 MG tablet Commonly known as:  PROVIGIL Take 1 tablet (200 mg total) by mouth daily.   montelukast 10 MG tablet Commonly known as:  SINGULAIR Take 1 tablet (10 mg total) by mouth at bedtime.   mupirocin cream 2 % Commonly known as:  BACTROBAN Apply 1 application topically 2 (two) times daily.   nitroGLYCERIN 0.4 MG SL tablet Commonly known as:  NITROSTAT Place 1 tablet (0.4 mg total) under the tongue every 5 (five) minutes as needed for chest pain.   nystatin powder Generic drug:  nystatin Apply topically 2 (two) times daily. Apply under breasts   ondansetron 4 MG disintegrating tablet Commonly known as:  ZOFRAN-ODT Take 1 tablet (4 mg total) by mouth 2 (two) times daily as needed for nausea or vomiting.   oxyCODONE 5 MG immediate release tablet Commonly known as:  Oxy IR/ROXICODONE Take 5 mg by mouth 2 (two) times daily.   OXYGEN Inhale 3 L into the lungs continuous. 3 liters 24/7   pantoprazole 20 MG tablet Commonly known as:  PROTONIX Take 1 tablet (20 mg total) by mouth 3 times daily before a meal.   potassium chloride 10 MEQ tablet Commonly known as:  K-DUR Take 2 tablets (20 mEq total)  by mouth daily. For 3 days.  In addition to usual dose of potassium.     potassium chloride SA 20 MEQ  tablet Commonly known as:  K-DUR,KLOR-CON Take 1 tablet (20 mEq total) by mouth 3 (three) times daily.   PRESCRIPTION MEDICATION Inhale into the lungs See admin instructions. Use BIPAP every time laying down   primidone 50 MG tablet Commonly known as:  MYSOLINE Take 1 tablet (50 mg total) by mouth at bedtime.   PROBIOTIC PO Take 1 tablet by mouth at bedtime.   triamcinolone lotion 0.1 % Commonly known as:  KENALOG Apply 1 application topically 3 (three) times daily. For up to 14 days and as needed   VITAMIN A & D PO Take by mouth.   VITAMIN E PO Take 1 tablet by mouth daily.       Consultations:   Procedures Performed:  No results found. EGD and colonoscopy with findings reported above.   Discharge Labs:  Results for orders placed or performed during the hospital encounter of 03/14/16 (from the past 24 hour(s))  Glucose, capillary     Status: Abnormal   Collection Time: 03/14/16  4:49 PM  Result Value Ref Range   Glucose-Capillary 137 (H) 65 - 99 mg/dL   Comment 1 Notify RN   MRSA PCR Screening     Status: None   Collection Time: 03/14/16  5:02 PM  Result Value Ref Range   MRSA by PCR NEGATIVE NEGATIVE  CBC with Differential/Platelet     Status: Abnormal   Collection Time: 03/14/16  7:07 PM  Result Value Ref Range   WBC 12.9 (H) 4.0 - 10.5 K/uL   RBC 4.16 3.87 - 5.11 MIL/uL   Hemoglobin 11.4 (L) 12.0 - 15.0 g/dL   HCT 35.3 (L) 36.0 - 46.0 %   MCV 84.9 78.0 - 100.0 fL   MCH 27.4 26.0 - 34.0 pg   MCHC 32.3 30.0 - 36.0 g/dL   RDW 12.5 11.5 - 15.5 %   Platelets 286 150 - 400 K/uL   Neutrophils Relative % 73 %   Neutro Abs 9.4 (H) 1.7 - 7.7 K/uL   Lymphocytes Relative 20 %   Lymphs Abs 2.6 0.7 - 4.0 K/uL   Monocytes Relative 5 %   Monocytes Absolute 0.6 0.1 - 1.0 K/uL   Eosinophils Relative 2 %   Eosinophils Absolute 0.3 0.0 - 0.7 K/uL   Basophils Relative 0 %   Basophils Absolute 0.0 0.0 - 0.1 K/uL  Comprehensive metabolic panel     Status: Abnormal    Collection Time: 03/14/16  7:07 PM  Result Value Ref Range   Sodium 136 135 - 145 mmol/L   Potassium 2.9 (L) 3.5 - 5.1 mmol/L   Chloride 88 (L) 101 - 111 mmol/L   CO2 35 (H) 22 - 32 mmol/L   Glucose, Bld 144 (H) 65 - 99 mg/dL   BUN 36 (H) 6 - 20 mg/dL   Creatinine, Ser 1.38 (H) 0.44 - 1.00 mg/dL   Calcium 9.5 8.9 - 10.3 mg/dL   Total Protein 8.0 6.5 - 8.1 g/dL   Albumin 4.1 3.5 - 5.0 g/dL   AST 21 15 - 41 U/L   ALT 11 (L) 14 - 54 U/L   Alkaline Phosphatase 77 38 - 126 U/L   Total Bilirubin 0.5 0.3 - 1.2 mg/dL   GFR calc non Af Amer 39 (L) >60 mL/min   GFR calc Af Amer 46 (L) >60 mL/min   Anion gap 13  5 - 15  Hemoglobin A1c     Status: Abnormal   Collection Time: 03/14/16  7:08 PM  Result Value Ref Range   Hgb A1c MFr Bld 8.9 (H) 4.8 - 5.6 %   Mean Plasma Glucose 209 mg/dL  Glucose, capillary     Status: Abnormal   Collection Time: 03/14/16  8:44 PM  Result Value Ref Range   Glucose-Capillary 117 (H) 65 - 99 mg/dL  Magnesium     Status: None   Collection Time: 03/14/16 11:42 PM  Result Value Ref Range   Magnesium 1.7 1.7 - 2.4 mg/dL  Glucose, capillary     Status: Abnormal   Collection Time: 03/15/16  7:38 AM  Result Value Ref Range   Glucose-Capillary 149 (H) 65 - 99 mg/dL   Comment 1 Notify RN   Glucose, capillary     Status: Abnormal   Collection Time: 03/15/16 11:56 AM  Result Value Ref Range   Glucose-Capillary 176 (H) 65 - 99 mg/dL    Disposition and follow-up:   Ms.Denise Freeman was discharged from  in fair condition.  Follow up Appointments:  She has several upcoming appointments.  We will need to call [name] for an appointment. We'll need to go to the [place] or [place] lab early next week for potassium level assay.  Discharge Instructions    Diet - low sodium heart healthy    Complete by:  As directed    Diet Carb Modified    Complete by:  As directed    Increase activity slowly    Complete by:  As directed        Diet at  Discharge Diabetic, low-sodium.  Time Spent on discharge:  40 minutes   Signed: Mordecai Maes  [phone] 03/15/2016, 3:42 PM

## 2016-03-15 NOTE — Progress Notes (Signed)
   03/15/16 1310  Clinical Encounter Type  Visited With Patient  Visit Type Spiritual support  Referral From Other (Comment) (consult)  Spiritual Encounters  Spiritual Needs Prayer  Stress Factors  Patient Stress Factors Health changes  Offered prayer for hope and recovery.

## 2016-03-15 NOTE — Anesthesia Procedure Notes (Signed)
Procedure Name: MAC Date/Time: 03/15/2016 1:31 PM Performed by: Kyung Rudd Pre-anesthesia Checklist: Patient identified, Emergency Drugs available, Suction available and Patient being monitored Patient Re-evaluated:Patient Re-evaluated prior to inductionOxygen Delivery Method: Nasal cannula Intubation Type: IV induction Placement Confirmation: positive ETCO2 Dental Injury: Teeth and Oropharynx as per pre-operative assessment

## 2016-03-15 NOTE — Anesthesia Preprocedure Evaluation (Signed)
Anesthesia Evaluation  Patient identified by MRN, date of birth, ID band Patient awake    Reviewed: Allergy & Precautions, NPO status , [name] Chart, lab work & pertinent test results  Airway Mallampati: II  TM Distance: >3 FB Neck ROM: Full    Dental no notable dental hx.    Pulmonary asthma , sleep apnea and Oxygen sleep apnea , COPD, former smoker,    Pulmonary exam normal breath sounds clear to auscultation       Cardiovascular + CAD and + Past MI  Normal cardiovascular exam+ dysrhythmias Atrial Fibrillation  Rhythm:Regular Rate:Normal     Neuro/Psych Bipolar Disorder negative neurological ROS     GI/Hepatic Neg liver ROS, GERD  ,  Endo/Other  diabetes  Renal/GU negative Renal ROS  negative genitourinary   Musculoskeletal negative musculoskeletal ROS (+)   Abdominal   Peds negative pediatric ROS (+)  Hematology negative hematology ROS (+)   Anesthesia Other Findings   Reproductive/Obstetrics negative OB ROS                             Anesthesia Physical Anesthesia Plan  ASA: IV  Anesthesia Plan: MAC   Post-op Pain Management:    Induction: Intravenous  Airway Management Planned: Simple Face Mask  Additional Equipment:   Intra-op Plan:   Post-operative Plan:   Informed Consent: I have reviewed the patients History and Physical, chart, labs and discussed the procedure including the risks, benefits and alternatives for the proposed anesthesia with the patient or authorized representative who has indicated his/her understanding and acceptance.   Dental advisory given  Plan Discussed with: CRNA and Surgeon  Anesthesia Plan Comments:         Anesthesia Quick Evaluation

## 2016-03-15 NOTE — Progress Notes (Signed)
[facility] PROGRESS NOTE  Denise Freeman [facility] DGU:[id number] DOB: 10-06-1950 DOA: 03/14/2016   PCP: Betty Martinique, MD     Assessment/Plan: Principal Problem:   Melena Active Problems:   Atrial fibrillation [I48.91]   Fibromyalgia   Bipolar 1 disorder (HCC)   Chronic combined systolic and diastolic CHF, NYHA class 1 (HCC)   Chronic hypoxic, hypercapnic respiratory failure   Microcytic anemia   OSA (obstructive sleep apnea)   COPD with asthma (HCC)   Peripheral polyneuropathy (HCC)   Insulin dependent type 2 diabetes mellitus, uncontrolled (HCC)   CKD (chronic kidney disease) stage 3, GFR 30-59 ml/min   HLD (hyperlipidemia)   Scalp lesion   Blood in stool   65 y.o. Caucasian female with multiple medical problems including anemia, near blindness, anxiety, asthma, A. fib, bipolar 1 disorder, chronic systolic CHF, COPD maintained on supplemental oxygen, atrial fibrillation, severe diabetes, depression, fibromyalgia, GERD, neuropathy and sleep apnea, admitted for workup of her anemia  Assessment and plan  Melena -Patient has had recurrent melena with associated acute blood loss anemia that has required transfusion in the past and has prohibited her from continuing on anticoagulation for her atrial fibrillation -She has now been admitted to undergo endoscopy to determine the etiology of bleeding -Treatment recommendations at discretion of primary GI team -Patient is to undergo bowel prep this evening in anticipation for endoscopy in a.m. Replace potassium prior to placing patient under conscious sedation, will place patient on telemetry given her low potassium -Change oral Protonix to Pepcid 20 mg IV every 12 hours until after bowel prep and endoscopy completed Hemoglobin actually better than baseline     Atrial fibrillation [I48.91] -Patient with history of chronic atrial fibrillation followed by [name] -Previously on Xarelto with GI bleeding as described  above; prior to that had been on Coumadin and recommendation was to resume Coumadin but in setting of ongoing GI bleeding this has been deferred until after above endoscopy completed -CHADVASC = 5 -Continue Cardizem for rate control  Resume Coumadin when safe from a GI standpoint     Microcytic anemia -Etiology appears to be related to ongoing chronic GI bleeding as evidenced by melena -Baseline hemoglobin 10.3, hemoglobin 11.4 Follow CBC     Insulin dependent type 2 diabetes mellitus, uncontrolled  Continue current dose of Lantus, diabetes coordinator consult -We'll utilize moderate sliding scale insulin and make adjustments considering her home regimen (see below) once diet advanced by GI -Hemoglobin A1c 8.9 -Followed by Dr. Keitha Butte in the outpatient setting -Of note did not respond to Januvia so was discontinued and metformin was stopped secondary to nausea -Home dose of Toujeo is 50 units in the a.m. and 40 units at hour of sleep      Chronic combined systolic and diastolic CHF, NYHA class 1  -Currently compensated -Not on ACE inhibitor secondary to chronic kidney disease -We'll hold preadmission Lasix and Aldactone while receiving bowel prep and can consider resuming within 24 hours after procedures completed Follow electrolytes closely     Chronic hypoxic, hypercapnic respiratory failure/ COPD with asthma -Continue oxygen at 3 L/m -Currently compensated without wheezing -Tonight's dose Singulair on hold while receiving bowel prep-recommend resume on 10/13 post procedure -Continue preadmission DuoNeb and budesonide nebulizers    OSA (obstructive sleep apnea) -We will order CPAP in the event patient utilizes at home -Continue preadmission Provigil -Holding tonight's dose of Remeron since we'll be receiving bowel prep/NPO after MN    CKD (chronic kidney  disease) stage 3, GFR 30-59 ml/min -Baseline renal function: 27/1.19 Follow renal function      Fibromyalgia/Bipolar 1 disorder/Peripheral polyneuropathy  -Continue preadmission medications: Mysoline, oxycodone but will hold hour of sleep Lexapro-can resume bedtime medications tomorrow on 10/13    HLD (hyperlipidemia) -Holding Lipitor and fenofibrate since will only be on clear liquids and will be NPO midnight      Scalp lesion -Patient gives a history of recurrent migratory maculopapular pruritic lesions over the years with apparent secondary infections due to patient's self traumatizing by scratching -Patient has a lesion on the top of her head that started in a similar fashion and has not healed despite several topical creams ordered by dermatology -?? If these are secondary to MRSA -MRSA PCR screen -If not already done consider utilizing Bactroban ointment on scalp lesion -If lesion continues not to heal properly recommend return to dermatologist for further evaluation     DVT prophylaxis: SCDs      Code Status:  DO NOT RESUSCITATE     Code Status Orders        Start     Ordered     Family Communication: Discussed in detail with the patient, all imaging results, lab results explained to the patient   Disposition Plan:  Disposition as per GI      Consultants:  Gastroenterology    Procedures:  None  Antibiotics: Anti-infectives    None         HPI/Subjective: Resting comfortably, stable on 3 L of oxygen, denies chest pain or shortness of breath  Objective: Vitals:   03/14/16 2008 03/14/16 2045 03/15/16 0500 03/15/16 0554  BP:  (!) 126/36  (!) 141/78  Pulse:  88  90  Temp:  98 F (36.7 C)  98.2 F (36.8 C)  TempSrc:  Oral  Oral  SpO2: 100% 100%  99%  Weight:   92.5 kg (203 lb 14.8 oz)   Height:        Intake/Output Summary (Last 24 hours) at 03/15/16 0913 Last data filed at 03/15/16 0400  Gross per 24 hour  Intake          1229.16 ml  Output              400 ml  Net           829.16 ml    Exam:  Examination:  General  exam: Appears calm and comfortable  Respiratory system: Clear to auscultation. Respiratory effort normal. Cardiovascular system: S1 & S2 heard, RRR. No JVD, murmurs, rubs, gallops or clicks. No pedal edema. Gastrointestinal system: Abdomen is nondistended, soft and nontender. No organomegaly or masses felt. Normal bowel sounds heard. Central nervous system: Alert and oriented. No focal neurological deficits. Extremities: Symmetric 5 x 5 power. Skin: No rashes, lesions or ulcers Psychiatry: Judgement and insight appear normal. Mood & affect appropriate.     Data Reviewed: I have personally reviewed following labs and imaging studies  Micro Results Recent Results (from the past 240 hour(s))  MRSA PCR Screening     Status: None   Collection Time: 03/14/16  5:02 PM  Result Value Ref Range Status   MRSA by PCR NEGATIVE NEGATIVE Final    Comment:        The GeneXpert MRSA Assay (FDA approved for NASAL specimens only), is one component of a comprehensive MRSA colonization surveillance program. It is not intended to diagnose MRSA infection nor to guide or monitor treatment for MRSA infections.  Radiology Reports No results found.   CBC  Recent Labs Lab 03/14/16 1907  WBC 12.9*  HGB 11.4*  HCT 35.3*  PLT 286  MCV 84.9  MCH 27.4  MCHC 32.3  RDW 12.5  LYMPHSABS 2.6  MONOABS 0.6  EOSABS 0.3  BASOSABS 0.0    Chemistries   Recent Labs Lab 03/14/16 1907 03/14/16 2342  NA 136  --   K 2.9*  --   CL 88*  --   CO2 35*  --   GLUCOSE 144*  --   BUN 36*  --   CREATININE 1.38*  --   CALCIUM 9.5  --   MG  --  1.7  AST 21  --   ALT 11*  --   ALKPHOS 77  --   BILITOT 0.5  --    ------------------------------------------------------------------------------------------------------------------ estimated creatinine clearance is 40.9 mL/min (by C-G formula based on SCr of 1.38 mg/dL  (H)). ------------------------------------------------------------------------------------------------------------------  Recent Labs  03/14/16 1908  HGBA1C 8.9*   ------------------------------------------------------------------------------------------------------------------ No results for input(s): CHOL, HDL, LDLCALC, TRIG, CHOLHDL, LDLDIRECT in the last 72 hours. ------------------------------------------------------------------------------------------------------------------ No results for input(s): TSH, T4TOTAL, T3FREE, THYROIDAB in the last 72 hours.  Invalid input(s): FREET3 ------------------------------------------------------------------------------------------------------------------ No results for input(s): VITAMINB12, FOLATE, FERRITIN, TIBC, IRON, RETICCTPCT in the last 72 hours.  Coagulation profile No results for input(s): INR, PROTIME in the last 168 hours.  No results for input(s): DDIMER in the last 72 hours.  Cardiac Enzymes No results for input(s): CKMB, TROPONINI, MYOGLOBIN in the last 168 hours.  Invalid input(s): CK ------------------------------------------------------------------------------------------------------------------ Invalid input(s): POCBNP   CBG:  Recent Labs Lab 03/14/16 1649 03/14/16 2044 03/15/16 0738  GLUCAP 137* 117* 149*       Studies: No results found.    Lab Results  Component Value Date   HGBA1C 8.9 (H) 03/14/2016   HGBA1C 9.0 02/28/2016   HGBA1C 7.8 11/28/2015   Lab Results  Component Value Date   CREATININE 1.38 (H) 03/14/2016       Scheduled Meds: . budesonide  0.5 mg Nebulization Daily  . diltiazem  30 mg Oral BID  . famotidine (PEPCID) IV  20 mg Intravenous Q12H  . insulin aspart  0-15 Units Subcutaneous TID WC  . insulin aspart  0-5 Units Subcutaneous QHS  . insulin glargine  25 Units Subcutaneous Daily   And  . insulin glargine  20 Units Subcutaneous QHS  . ipratropium-albuterol  3 mL  Nebulization QID  . metoCLOPramide (REGLAN) injection  5 mg Intravenous Q6H  . modafinil  200 mg Oral Daily  . mupirocin cream  1 application Topical BID  . nystatin   Topical BID  . oxyCODONE  5 mg Oral BID  . primidone  50 mg Oral QHS  . triamcinolone cream  1 application Topical TID   Continuous Infusions: . 0.9 % sodium chloride with kcl       LOS: 0 days    Time spent: >30 MINS    [facility]  [facility] Pager [phone]. If 7PM-7AM, please contact night-coverage at [email or web address], password [facility] 03/15/2016, 9:13 AM  LOS: 0 days

## 2016-03-15 NOTE — Op Note (Signed)
[facility] Patient Name: Denise Freeman Procedure Date : 03/15/2016 MRN: [id number] Attending MD: Gatha Mayer , MD Date of Birth: 01-09-1951 CSN: [id number] Age: 65 Admit Type: Inpatient Procedure:                Upper GI endoscopy Indications:              Heartburn, Melena Providers:                Gatha Mayer, MD, William Dalton, Technician,                            Vista Lawman, RN Referring MD:              Medicines:                Monitored Anesthesia Care Complications:            No immediate complications. Estimated Blood Loss:     Estimated blood loss: none. Procedure:                Pre-Anesthesia Assessment:                           - Prior to the procedure, a History and Physical                            was performed, and patient medications and                            allergies were reviewed. The patient's tolerance of                            previous anesthesia was also reviewed. The risks                            and benefits of the procedure and the sedation                            options and risks were discussed with the patient.                            All questions were answered, and informed consent                            was obtained. Prior Anticoagulants: The patient has                            taken no previous anticoagulant or antiplatelet                            agents. ASA Grade Assessment: IV - A patient with                            severe systemic disease that is a constant threat  to life. After reviewing the risks and benefits,                            the patient was deemed in satisfactory condition to                            undergo the procedure.                           After obtaining informed consent, the endoscope was                            passed under direct vision. Throughout the                            procedure, the patient's blood pressure,  pulse, and                            oxygen saturations were monitored continuously. The                            EG-[id number] ([id number]) scope was introduced through the                            mouth, and advanced to the second part of duodenum.                            The upper GI endoscopy was accomplished without                            difficulty. The patient tolerated the procedure                            well. Scope In: Scope Out: Findings:      The esophagus was normal.      The stomach was normal.      The examined duodenum was normal.      The cardia and gastric fundus were normal on retroflexion. Impression:               - Normal esophagus.                           - Normal stomach.                           - Normal examined duodenum.                           - No specimens collected. Moderate Sedation:      Please see anesthesia notes, moderate sedation not given Recommendation:           - See the other procedure note for documentation of                            additional recommendations.                           -  Resume previous diet.                           - Continue present medications. Procedure Code(s):        --- Professional ---                           [phone], Esophagogastroduodenoscopy, flexible,                            transoral; diagnostic, including collection of                            specimen(s) by brushing or washing, when performed                            (separate procedure) Diagnosis Code(s):        --- Professional ---                           R12, Heartburn                           K92.1, Melena (includes Hematochezia) CPT copyright 2016 [employer]. All rights reserved. The codes documented in this report are preliminary and upon coder review may  be revised to meet current compliance requirements. Gatha Mayer, MD 03/15/2016 2:11:07 PM This report has been signed electronically. Number of  Addenda: 0

## 2016-03-15 NOTE — Progress Notes (Signed)
Nutrition Brief Note  Patient identified on the Malnutrition Screening Tool (MST) Report  Wt Readings from Last 15 Encounters:  03/15/16 204 lb (92.5 kg)  03/01/16 201 lb (91.2 kg)  02/28/16 205 lb (93 kg)  01/30/16 207 lb 6.4 oz (94.1 kg)  11/28/15 203 lb (92.1 kg)  10/31/15 210 lb 9.6 oz (95.5 kg)  09/26/15 211 lb 4.8 oz (95.8 kg)  08/28/15 208 lb (94.3 kg)  08/01/15 210 lb 9.6 oz (95.5 kg)  06/26/15 205 lb 6.4 oz (93.2 kg)  06/07/15 209 lb (94.8 kg)  04/21/15 212 lb 12.8 oz (96.5 kg)  04/11/15 217 lb (98.4 kg)  04/04/15 213 lb (96.6 kg)  03/30/15 212 lb (96.2 kg)   Denise Freeman is a 65 y.o. Caucasian female with multiple medical problems including anemia, near blindness, anxiety, asthma, A. fib, bipolar 1 disorder, CHF, COPD maintained on supplemental oxygen, atrial fibrillation, severe diabetes, depression, fibromyalgia, GERD, neuropathy and sleep apnea, who presented to the hospital today for further evaluation of her iron deficiency anemia and recent intermittent melena. Patient is scheduled for an EGD and colonoscopy tomorrow with Dr. Carlean Purl.  Spoke with Denise Freeman at bedside, who is currently NPO for colonoscopy today. She reports fair appetite at baseline and generally consumes 4-6 small meals daily.   She reveals fair glycemic control; followed as outpatient by endocrinology. Home DM regimen is as follows: 50 units Tuojeo q AM ans 40 units q HS; sliding scale humalog.   Denise Freeman reports 40# wt loss over the past several years. Upon further discussion, Denise Freeman agrees that wt loss is likely related to lifestyle changes regarding DM.   Nutrition-Focused physical exam completed. Findings are no fat depletion, no muscle depletion, and mild edema.   Body mass index is 41.2 kg/m. Patient meets criteria for extreme obesity, class III based on current BMI.   Current diet order is Carb modified, patient is consuming approximately 100% of meals at this time. Labs and medications reviewed.    No nutrition interventions warranted at this time. If nutrition issues arise, please consult RD.   Gustave Lindeman A. Jimmye Norman, RD, LDN, CDE Pager: [phone] After hours Pager: [phone]

## 2016-03-15 NOTE — Transfer of Care (Signed)
Immediate Anesthesia Transfer of Care Note  Patient: Jakiyah Stepney Fehr  Procedure(s) Performed: Procedure(s): ESOPHAGOGASTRODUODENOSCOPY (EGD) WITH PROPOFOL (N/A) COLONOSCOPY WITH PROPOFOL (N/A)  Patient Location: Endoscopy Unit  Anesthesia Type:MAC  Level of Consciousness: awake, alert  and oriented  Airway & Oxygen Therapy: Patient Spontanous Breathing and Patient connected to nasal cannula oxygen  Post-op Assessment: Report given to RN, Post -op Vital signs reviewed and stable and Patient moving all extremities  Post vital signs: Reviewed and stable  Last Vitals:  Vitals:   03/15/16 1234 03/15/16 1410  BP: (!) 142/68 (!) 121/44  Pulse: 82 82  Resp: 19 16  Temp: 36.8 C 36.6 C    Last Pain:  Vitals:   03/15/16 1410  TempSrc: Oral  PainSc:          Complications: No apparent anesthesia complications

## 2016-03-15 NOTE — Progress Notes (Signed)
Denise Freeman to be D/C'd  per MD order. Discussed with the patient and all questions fully answered.  VSS, Skin clean, dry and intact without evidence of skin break down, no evidence of skin tears noted.  IV catheter discontinued intact. Site without signs and symptoms of complications. Dressing and pressure applied.  An After Visit Summary was printed and given to the patient. Patient received prescription.  D/c education completed with patient/family including follow up instructions, medication list, d/c activities limitations if indicated, with other d/c instructions as indicated by MD - patient able to verbalize understanding, all questions fully answered.   Patient instructed to return to ED, call 911, or call MD for any changes in condition.   Patient to be escorted via [place], and D/C home via private auto.

## 2016-03-15 NOTE — Progress Notes (Signed)
Pt states she takes nebulizer txs TID at home and a fourth as needed.  Pt assessed and orders changed to reflect this.

## 2016-03-18 ENCOUNTER — Encounter (HOSPITAL_COMMUNITY): Payer: Self-pay | Admitting: Internal Medicine

## 2016-03-19 DIAGNOSIS — Z79891 Long term (current) use of opiate analgesic: Secondary | ICD-10-CM | POA: Diagnosis not present

## 2016-03-19 DIAGNOSIS — M5442 Lumbago with sciatica, left side: Secondary | ICD-10-CM | POA: Diagnosis not present

## 2016-03-19 DIAGNOSIS — G894 Chronic pain syndrome: Secondary | ICD-10-CM | POA: Diagnosis not present

## 2016-03-19 DIAGNOSIS — G603 Idiopathic progressive neuropathy: Secondary | ICD-10-CM | POA: Diagnosis not present

## 2016-03-19 DIAGNOSIS — M542 Cervicalgia: Secondary | ICD-10-CM | POA: Diagnosis not present

## 2016-03-21 ENCOUNTER — Encounter: Payer: Self-pay | Admitting: Internal Medicine

## 2016-03-21 DIAGNOSIS — Z860100 Personal history of colon polyps, unspecified: Secondary | ICD-10-CM | POA: Insufficient documentation

## 2016-03-21 HISTORY — DX: Personal history of colonic polyps: Z86.010

## 2016-03-21 HISTORY — DX: Personal history of colon polyps, unspecified: Z86.0100

## 2016-03-21 NOTE — Telephone Encounter (Signed)
Denise Freeman are we able to close this message?  Please advise. thanks

## 2016-03-21 NOTE — Progress Notes (Signed)
3 small adenomas no recall -

## 2016-03-21 NOTE — Telephone Encounter (Signed)
Order has now been placed.  Will close encounter.

## 2016-04-02 DIAGNOSIS — L989 Disorder of the skin and subcutaneous tissue, unspecified: Secondary | ICD-10-CM | POA: Diagnosis not present

## 2016-04-10 DIAGNOSIS — H53459 Other localized visual field defect, unspecified eye: Secondary | ICD-10-CM | POA: Diagnosis not present

## 2016-04-10 DIAGNOSIS — Z79899 Other long term (current) drug therapy: Secondary | ICD-10-CM | POA: Diagnosis not present

## 2016-04-10 DIAGNOSIS — H53419 Scotoma involving central area, unspecified eye: Secondary | ICD-10-CM | POA: Diagnosis not present

## 2016-04-10 DIAGNOSIS — H47619 Cortical blindness, unspecified side of brain: Secondary | ICD-10-CM | POA: Diagnosis not present

## 2016-04-10 DIAGNOSIS — F411 Generalized anxiety disorder: Secondary | ICD-10-CM | POA: Diagnosis not present

## 2016-04-10 DIAGNOSIS — E119 Type 2 diabetes mellitus without complications: Secondary | ICD-10-CM | POA: Diagnosis not present

## 2016-04-19 ENCOUNTER — Other Ambulatory Visit: Payer: Self-pay

## 2016-04-19 ENCOUNTER — Ambulatory Visit (INDEPENDENT_AMBULATORY_CARE_PROVIDER_SITE_OTHER): Payer: Medicare Other

## 2016-04-19 ENCOUNTER — Other Ambulatory Visit: Payer: Self-pay | Admitting: Family Medicine

## 2016-04-19 ENCOUNTER — Other Ambulatory Visit: Payer: Self-pay | Admitting: Pulmonary Disease

## 2016-04-19 VITALS — BP 140/70 | HR 76 | Ht 59.0 in | Wt 201.0 lb

## 2016-04-19 DIAGNOSIS — N183 Chronic kidney disease, stage 3 unspecified: Secondary | ICD-10-CM

## 2016-04-19 DIAGNOSIS — E2839 Other primary ovarian failure: Secondary | ICD-10-CM

## 2016-04-19 DIAGNOSIS — Z0000 Encounter for general adult medical examination without abnormal findings: Secondary | ICD-10-CM | POA: Diagnosis not present

## 2016-04-19 DIAGNOSIS — E1365 Other specified diabetes mellitus with hyperglycemia: Secondary | ICD-10-CM | POA: Diagnosis not present

## 2016-04-19 DIAGNOSIS — IMO0002 Reserved for concepts with insufficient information to code with codable children: Secondary | ICD-10-CM

## 2016-04-19 DIAGNOSIS — E1322 Other specified diabetes mellitus with diabetic chronic kidney disease: Secondary | ICD-10-CM

## 2016-04-19 DIAGNOSIS — Z7289 Other problems related to lifestyle: Secondary | ICD-10-CM

## 2016-04-19 DIAGNOSIS — H547 Unspecified visual loss: Secondary | ICD-10-CM

## 2016-04-19 DIAGNOSIS — E782 Mixed hyperlipidemia: Secondary | ICD-10-CM

## 2016-04-19 LAB — LIPID PANEL
CHOL/HDL RATIO: 6
CHOLESTEROL: 236 mg/dL — AB (ref 0–200)
HDL: 37.7 mg/dL — ABNORMAL LOW (ref >39.00)
NONHDL: 197.93
Triglycerides: 258 mg/dL — ABNORMAL HIGH (ref 0.0–149.0)
VLDL: 51.6 mg/dL — ABNORMAL HIGH (ref 0.0–40.0)

## 2016-04-19 LAB — BASIC METABOLIC PANEL
BUN: 52 mg/dL — AB (ref 6–23)
CHLORIDE: 89 meq/L — AB (ref 96–112)
CO2: 37 meq/L — AB (ref 19–32)
CREATININE: 1.33 mg/dL — AB (ref 0.40–1.20)
Calcium: 9.7 mg/dL (ref 8.4–10.5)
GFR: 42.55 mL/min — ABNORMAL LOW (ref >60.00)
Glucose, Bld: 129 mg/dL — ABNORMAL HIGH (ref 70–99)
Potassium: 3.2 mEq/L — ABNORMAL LOW (ref 3.5–5.1)
Sodium: 139 mEq/L (ref 135–145)

## 2016-04-19 LAB — LDL CHOLESTEROL, DIRECT: LDL DIRECT: 153 mg/dL

## 2016-04-19 MED ORDER — MODAFINIL 200 MG PO TABS: 200.0000 mg | ORAL_TABLET | Freq: Every day | ORAL | 0 refills | Status: DC

## 2016-04-19 NOTE — Telephone Encounter (Signed)
rx has been printed and will need to have VS sign this rx and fax this to [facility]. AG can you have VS sign this rx and fax back to [facility].

## 2016-04-19 NOTE — Patient Instructions (Addendum)
Denise Freeman , Thank you for taking time to come for your Medicare Wellness Visit. I appreciate your ongoing commitment to your health goals. Please review the following plan we discussed and let me know if I can assist you in the future.   Your foot exam was completed today  You had tetanus x 65 yo and will put this in epic for 06/03/2013  Eyes followed by Dr. Rosary Lively (march of 2017)  and Dr. Zadie Rhine; has eye exams routinely  Discussed Hep c and agreed to have drawn today  You are working on hearing aids through division of hoh  Will plan to have dexa in June when you have your mammogram  Will be going to Pulmonary rehab for exercise;  Will defer UA micro albumin due to w/c status;   Will try to get your blood work today since you have not had anything to eat or drink You will see Dr. Martinique to fup on your physical and to clean your ears next week  Diabetes and weight loss; [facility]  Phone: [phone]   [place] [facility]; [phone] [address] [place]; 9030897407 Get resource to get information on any and all community programs for Seniors  [place]: 806-536-3698 Community Health Response Program -287-867-6720 Public Health Dept; Need to be a skilled visit but can assist with bathing as well; ([phone]  Adult center for Enrichment;  Call Senior Line; [phone]  Adult day services include Adult Day Care, Adult Day Healthcare, Group Respite, Care Partners, Volunteer In [employer], Education and Support Program  Dept of Social Services; Call [phone] and ask for SW on call  Options for Medicaid include the Community Alternatives program; [facility] (personal care services) or PACE program, which is a medical and social program combined  Braulio Conte manages the community Alternatives program at the [employer]; 751-700-1749 (this is a program with a waiting list but provides SNF care at home;  [facility] 2 required; Call Claiborne Billings and she  will send out packet of information   Caregiver support group and information regarding [place] is at the; [facility] Address: [address], [place], [place] [address]  Phone: (667) [phone]   [employer] general resources for food etc   Http://nihseniorhealth.giv  Deaf & [facility] of Hearing Division Services - can assist with hearing aid x 1  No reviews  [facility]  [address]  [phone]      These are the goals we discuss Goals    . patient          Goals is to start pulmonary rehab        This is a list of the screening recommended for you and due dates:  Health Maintenance  Topic Date Due  .  Hepatitis C: One time screening is recommended by Center for Disease Control  (CDC) for  adults born from [birth year] through 1965.   [birth year]  . Complete foot exam   03/20/1961  . Eye exam for diabetics  03/20/1961  . Urine Protein Check  03/20/1961  . HIV Screening  03/20/1966  . Tetanus Vaccine  03/20/1970  . Shingles Vaccine  03/21/2011  . DEXA scan (bone density measurement)  03/20/2016  . Hemoglobin A1C  09/12/2016  . Pneumonia vaccines (2 of 2 - PPSV23) 01/29/2017  . Mammogram  10/31/2017  . Pap Smear  04/10/2018  . Colon Cancer Screening  03/15/2026  . Flu Shot  Completed  Fall Prevention in the Home Introduction Falls can cause injuries. They can happen to people of all ages. There are many things you can do to make your home safe and to help prevent falls. What can I do on the outside of my home?  Regularly fix the edges of walkways and driveways and fix any cracks.  Remove anything that might make you trip as you walk through a door, such as a raised step or threshold.  Trim any bushes or trees on the path to your home.  Use bright outdoor lighting.  Clear any walking paths of anything that might make someone trip, such as rocks or tools.  Regularly check to see  if handrails are loose or broken. Make sure that both sides of any steps have handrails.  Any raised decks and porches should have guardrails on the edges.  Have any leaves, snow, or ice cleared regularly.  Use sand or salt on walking paths during winter.  Clean up any spills in your garage right away. This includes oil or grease spills. What can I do in the bathroom?  Use night lights.  Install grab bars by the toilet and in the tub and shower. Do not use towel bars as grab bars.  Use non-skid mats or decals in the tub or shower.  If you need to sit down in the shower, use a plastic, non-slip stool.  Keep the floor dry. Clean up any water that spills on the floor as soon as it happens.  Remove soap buildup in the tub or shower regularly.  Attach bath mats securely with double-sided non-slip rug tape.  Do not have throw rugs and other things on the floor that can make you trip. What can I do in the bedroom?  Use night lights.  Make sure that you have a light by your bed that is easy to reach.  Do not use any sheets or blankets that are too big for your bed. They should not hang down onto the floor.  Have a firm chair that has side arms. You can use this for support while you get dressed.  Do not have throw rugs and other things on the floor that can make you trip. What can I do in the kitchen?  Clean up any spills right away.  Avoid walking on wet floors.  Keep items that you use a lot in easy-to-reach places.  If you need to reach something above you, use a strong step stool that has a grab bar.  Keep electrical cords out of the way.  Do not use floor polish or wax that makes floors slippery. If you must use wax, use non-skid floor wax.  Do not have throw rugs and other things on the floor that can make you trip. What can I do with my stairs?  Do not leave any items on the stairs.  Make sure that there are handrails on both sides of the stairs and use them.  Fix handrails that are broken or loose. Make sure that handrails are as long as the stairways.  Check any carpeting to make sure that it is firmly attached to the stairs. Fix any carpet that is loose or worn.  Avoid having throw rugs at the top or bottom of the stairs. If you do have throw rugs, attach them to the floor with carpet tape.  Make sure that you have a light switch at the top of the stairs and the bottom of the stairs. If  you do not have them, ask someone to add them for you. What else can I do to help prevent falls?  Wear shoes that:  Do not have high heels.  Have rubber bottoms.  Are comfortable and fit you well.  Are closed at the toe. Do not wear sandals.  If you use a stepladder:  Make sure that it is fully opened. Do not climb a closed stepladder.  Make sure that both sides of the stepladder are locked into place.  Ask someone to hold it for you, if possible.  Clearly mark and make sure that you can see:  Any grab bars or handrails.  First and last steps.  Where the edge of each step is.  Use tools that help you move around (mobility aids) if they are needed. These include:  Canes.  Walkers.  Scooters.  Crutches.  Turn on the lights when you go into a dark area. Replace any light bulbs as soon as they burn out.  Set up your furniture so you have a clear path. Avoid moving your furniture around.  If any of your floors are uneven, fix them.  If there are any pets around you, be aware of where they are.  Review your medicines with your doctor. Some medicines can make you feel dizzy. This can increase your chance of falling. Ask your doctor what other things that you can do to help prevent falls. This information is not intended to replace advice given to you by your health care provider. Make sure you discuss any questions you have with your health care provider. Document Released: 03/16/2009 Document Revised: 10/26/2015 Document Reviewed:  06/24/2014  2017 Elsevier  Health Maintenance, Female Introduction Adopting a healthy lifestyle and getting preventive care can go a long way to promote health and wellness. Talk with your health care provider about what schedule of regular examinations is right for you. This is a good chance for you to check in with your provider about disease prevention and staying healthy. In between checkups, there are plenty of things you can do on your own. Experts have done a lot of research about which lifestyle changes and preventive measures are most likely to keep you healthy. Ask your health care provider for more information. Weight and diet Eat a healthy diet  Be sure to include plenty of vegetables, fruits, low-fat dairy products, and lean protein.  Do not eat a lot of foods high in solid fats, added sugars, or salt.  Get regular exercise. This is one of the most important things you can do for your health.  Most adults should exercise for at least 150 minutes each week. The exercise should increase your heart rate and make you sweat (moderate-intensity exercise).  Most adults should also do strengthening exercises at least twice a week. This is in addition to the moderate-intensity exercise. Maintain a healthy weight  Body mass index (BMI) is a measurement that can be used to identify possible weight problems. It estimates body fat based on height and weight. Your health care provider can help determine your BMI and help you achieve or maintain a healthy weight.  For females 77 years of age and older:  A BMI below 18.5 is considered underweight.  A BMI of 18.5 to 24.9 is normal.  A BMI of 25 to 29.9 is considered overweight.  A BMI of 30 and above is considered obese. Watch levels of cholesterol and blood lipids  You should start having your blood tested for  lipids and cholesterol at 65 years of age, then have this test every 5 years.  You may need to have your cholesterol levels  checked more often if:  Your lipid or cholesterol levels are high.  You are older than 66 years of age.  You are at high risk for heart disease. Cancer screening Lung Cancer  Lung cancer screening is recommended for adults 45-78 years old who are at high risk for lung cancer because of a history of smoking.  A yearly low-dose CT scan of the lungs is recommended for people who:  Currently smoke.  Have quit within the past 15 years.  Have at least a 30-pack-year history of smoking. A pack year is smoking an average of one pack of cigarettes a day for 1 year.  Yearly screening should continue until it has been 15 years since you quit.  Yearly screening should stop if you develop a health problem that would prevent you from having lung cancer treatment. Breast Cancer  Practice breast self-awareness. This means understanding how your breasts normally appear and feel.  It also means doing regular breast self-exams. Let your health care provider know about any changes, no matter how small.  If you are in your 20s or 30s, you should have a clinical breast exam (CBE) by a health care provider every 1-3 years as part of a regular health exam.  If you are 86 or older, have a CBE every year. Also consider having a breast X-ray (mammogram) every year.  If you have a family history of breast cancer, talk to your health care provider about genetic screening.  If you are at high risk for breast cancer, talk to your health care provider about having an MRI and a mammogram every year.  Breast cancer gene (BRCA) assessment is recommended for women who have family members with BRCA-related cancers. BRCA-related cancers include:  Breast.  Ovarian.  Tubal.  Peritoneal cancers.  Results of the assessment will determine the need for genetic counseling and BRCA1 and BRCA2 testing. Cervical Cancer  Your health care provider may recommend that you be screened regularly for cancer of the pelvic  organs (ovaries, uterus, and vagina). This screening involves a pelvic examination, including checking for microscopic changes to the surface of your cervix (Pap test). You may be encouraged to have this screening done every 3 years, beginning at age 56.  For women ages 21-65, health care providers may recommend pelvic exams and Pap testing every 3 years, or they may recommend the Pap and pelvic exam, combined with testing for human papilloma virus (HPV), every 5 years. Some types of HPV increase your risk of cervical cancer. Testing for HPV may also be done on women of any age with unclear Pap test results.  Other health care providers may not recommend any screening for nonpregnant women who are considered low risk for pelvic cancer and who do not have symptoms. Ask your health care provider if a screening pelvic exam is right for you.  If you have had past treatment for cervical cancer or a condition that could lead to cancer, you need Pap tests and screening for cancer for at least 20 years after your treatment. If Pap tests have been discontinued, your risk factors (such as having a new sexual partner) need to be reassessed to determine if screening should resume. Some women have medical problems that increase the chance of getting cervical cancer. In these cases, your health care provider may recommend more frequent screening  and Pap tests. Colorectal Cancer  This type of cancer can be detected and often prevented.  Routine colorectal cancer screening usually begins at 65 years of age and continues through 65 years of age.  Your health care provider may recommend screening at an earlier age if you have risk factors for colon cancer.  Your health care provider may also recommend using home test kits to check for hidden blood in the stool.  A small camera at the end of a tube can be used to examine your colon directly (sigmoidoscopy or colonoscopy). This is done to check for the earliest forms  of colorectal cancer.  Routine screening usually begins at age 39.  Direct examination of the colon should be repeated every 5-10 years through 65 years of age. However, you may need to be screened more often if early forms of precancerous polyps or small growths are found. Skin Cancer  Check your skin from head to toe regularly.  Tell your health care provider about any new moles or changes in moles, especially if there is a change in a mole's shape or color.  Also tell your health care provider if you have a mole that is larger than the size of a pencil eraser.  Always use sunscreen. Apply sunscreen liberally and repeatedly throughout the day.  Protect yourself by wearing long sleeves, pants, a wide-brimmed hat, and sunglasses whenever you are outside. Heart disease, diabetes, and high blood pressure  High blood pressure causes heart disease and increases the risk of stroke. High blood pressure is more likely to develop in:  People who have blood pressure in the high end of the normal range (130-139/85-89 mm Hg).  People who are overweight or obese.  People who are African American.  If you are 41-31 years of age, have your blood pressure checked every 3-5 years. If you are 54 years of age or older, have your blood pressure checked every year. You should have your blood pressure measured twice-once when you are at a hospital or clinic, and once when you are not at a hospital or clinic. Record the average of the two measurements. To check your blood pressure when you are not at a hospital or clinic, you can use:  An automated blood pressure machine at a pharmacy.  A home blood pressure monitor.  If you are between 19 years and 44 years old, ask your health care provider if you should take aspirin to prevent strokes.  Have regular diabetes screenings. This involves taking a blood sample to check your fasting blood sugar level.  If you are at a normal weight and have a low risk for  diabetes, have this test once every three years after 65 years of age.  If you are overweight and have a high risk for diabetes, consider being tested at a younger age or more often. Preventing infection Hepatitis B  If you have a higher risk for hepatitis B, you should be screened for this virus. You are considered at high risk for hepatitis B if:  You were born in a country where hepatitis B is common. Ask your health care provider which countries are considered high risk.  Your parents were born in a high-risk country, and you have not been immunized against hepatitis B (hepatitis B vaccine).  You have HIV or AIDS.  You use needles to inject street drugs.  You live with someone who has hepatitis B.  You have had sex with someone who has hepatitis B.  You get hemodialysis treatment.  You take certain medicines for conditions, including cancer, organ transplantation, and autoimmune conditions. Hepatitis C  Blood testing is recommended for:  Everyone born from [birth year] through 1965.  Anyone with known risk factors for hepatitis C. Sexually transmitted infections (STIs)  You should be screened for sexually transmitted infections (STIs) including gonorrhea and chlamydia if:  You are sexually active and are younger than 65 years of age.  You are older than 65 years of age and your health care provider tells you that you are at risk for this type of infection.  Your sexual activity has changed since you were last screened and you are at an increased risk for chlamydia or gonorrhea. Ask your health care provider if you are at risk.  If you do not have HIV, but are at risk, it may be recommended that you take a prescription medicine daily to prevent HIV infection. This is called pre-exposure prophylaxis (PrEP). You are considered at risk if:  You are sexually active and do not regularly use condoms or know the HIV status of your partner(s).  You take drugs by injection.  You are  sexually active with a partner who has HIV. Talk with your health care provider about whether you are at high risk of being infected with HIV. If you choose to begin PrEP, you should first be tested for HIV. You should then be tested every 3 months for as long as you are taking PrEP. Pregnancy  If you are premenopausal and you may become pregnant, ask your health care provider about preconception counseling.  If you may become pregnant, take 400 to 800 micrograms (mcg) of folic acid every day.  If you want to prevent pregnancy, talk to your health care provider about birth control (contraception). Osteoporosis and menopause  Osteoporosis is a disease in which the bones lose minerals and strength with aging. This can result in serious bone fractures. Your risk for osteoporosis can be identified using a bone density scan.  If you are 65 years of age or older, or if you are at risk for osteoporosis and fractures, ask your health care provider if you should be screened.  Ask your health care provider whether you should take a calcium or vitamin D supplement to lower your risk for osteoporosis.  Menopause may have certain physical symptoms and risks.  Hormone replacement therapy may reduce some of these symptoms and risks. Talk to your health care provider about whether hormone replacement therapy is right for you. Follow these instructions at home:  Schedule regular health, dental, and eye exams.  Stay current with your immunizations.  Do not use any tobacco products including cigarettes, chewing tobacco, or electronic cigarettes.  If you are pregnant, do not drink alcohol.  If you are breastfeeding, limit how much and how often you drink alcohol.  Limit alcohol intake to no more than 1 drink per day for nonpregnant women. One drink equals 12 ounces of beer, 5 ounces of wine, or 1 ounces of hard liquor.  Do not use street drugs.  Do not share needles.  Ask your health care  provider for help if you need support or information about quitting drugs.  Tell your health care provider if you often feel depressed.  Tell your health care provider if you have ever been abused or do not feel safe at home. This information is not intended to replace advice given to you by your health care provider. Make sure you discuss any  questions you have with your health care provider. Document Released: 12/03/2010 Document Revised: 10/26/2015 Document Reviewed: 02/21/2015  2017 [employer]vier

## 2016-04-19 NOTE — Progress Notes (Signed)
Subjective:   Denise Freeman is a 65 y.o. female who presents for Medicare Annual (Subsequent) preventive examination.  The Patient was informed that the wellness visit is to identify future health risk and educate and initiate measures that can reduce risk for increased disease through the lifespan.    NO ROS; Medicare Wellness Visit (established care 09/2015 with Dr. Martinique)   Describes health as good, fair or great?  Presents in scooter and needing direction due to poor vision. States she took her Long acting insulin this am and is NPO. Stated she is coming in for CPE tomorrow, but actually coming in after Thanksgiving. Did order labs today while NPO and offered food post lab.   Preventive Screening -Counseling & Management   Current smoking/ tobacco status/ 28 pack years documented + for copd and followed by Pulmonary ETOH NO  RISK FACTORS Regular exercise defer to Pulmonology as she has an apt next week for pulmonary rehab  Diet:  Recently eview by Dr. Cruzita Lederer  A1c up to 8.9  Has had 3 falls; just walking down the hallway Uses her pow to prevent falls; has scooter when out. States she does not "drive" without direction; sits and waits for direction before moving. Spouse brought her in today. Spouse is with her at home and her primary caregiver.  Has a ramp at home the church put up.  Spouse is helping with all ADLs and I ADLs Disability started last year with bleed; lost vision and copd has curtailed her activity per the patient.  Lung doctor getting her in [place]; Cardiac/.pulmondary rehab; Dr. Halford Chessman She does use the shower, but spouse transfer her to a chair in the shower.  LIves in a trailer.  Home plan would fail without spouse.  Cardiac Risk Factors:  CKD level 3 / COPD Advanced aged  >65 in women Hyperlipidemia/ rechecking today Diabetes A1c 7.8  Family History (HD; Copd; Diabetes; Breast cancer, father had HD; Hyperlpidemia)  Obesity 40; weight  loss plan deferred due to immediate issues Focus was on Preventive health  Eye exam Vision has not returned since her bleed last year  Dr Gershon Crane recently did cataract surgery but did not help  Dr. Zadie Rhine follows as well; Last eye exam by Gershon Crane was March 2017; last documentation per [facility] June 2017; will postpone eye exam for this year  Depression Screen/ negative at present; depends on her faith PhQ 2: negative  Activities of Daily Living - See functional screen  Spouse assisting with meds; meals; she can feed herself;  She can dress but needs assistance Services for the blind currently scheduling an apt.  Cognitive testing; memory appears intact. No issues presented during the assessment. Confusion regarding doctor's apt due to multiple apts coming up next week.  Ad8 score; 0 or less than 2  MMSE deferred or completed if AD8 + 2 issues  Advanced Directives: States she has AD in [place]; requested she bring a copy but would most likely need to repeat in [place]. Given a copy of the HCPOA from [place];  IS coming in to see Dr. Martinique and will schedule 30 minute apt to review the AD with DR Jordan's visit  List the name of Physicians or other Practitioners you currently use:  Dr Carlean Purl; GI Dr. Halford Chessman Pulmonary Dr. Carles Collet Neurology Has an implanted stimulator that prevents incontinence;  Has not found an MD here to provide care; Recommended she discuss with Dr. Martinique and may need referral to the tertiary center of  her choice  Immunization History  Administered Date(s) Administered  . Influenza Split 03/03/2014  . Influenza,inj,Quad PF,36+ Mos 01/30/2016  . Influenza-Unspecified 02/06/2015  . Pneumococcal Conjugate-13 01/30/2016   Required Immunizations needed today none   Screening test up to date or reviewed for plan of completion Health Maintenance Due  Topic Date Due  . Hepatitis C Screening  05/28/1951  . FOOT EXAM  03/20/1961  . OPHTHALMOLOGY EXAM  03/20/1961  . URINE  MICROALBUMIN  03/20/1961  . HIV Screening  03/20/1966  . TETANUS/TDAP  03/20/1970  . ZOSTAVAX  03/21/2011  . DEXA SCAN  03/20/2016   States she had a tetanus in 2015/ will enter  06/03/2013; the patient agreed  Deferred HIV   Agreed to Hep c  Completed foot exam  Will up eye exam  Is in process of getting a free hearing aid through the centers for Hard of Hearing  Dexa due; agreed to have done next year when she has her mammogram in June;  Plans to start Pulmonary rehab at [facility]; is being evaluated.  Deferred Microalbumin due to limitations and standing and fall risk  Blood draw done today for apt scheduled later in November  Will try to plan apt with Dr. Martinique to review Advanced Directive  States Social works is assisting her.        Objective:     Vitals: BP 140/70   Pulse 76   Ht 4\' 11"  (1.499 m)   Wt 201 lb (91.2 kg)   SpO2 98%   BMI 40.60 kg/m   Body mass index is 40.6 kg/m.   Tobacco History  Smoking Status  . Former Smoker  . Packs/day: 2.00  . Years: 14.00  . Types: Cigarettes  . Quit date: 04/12/1989  Smokeless Tobacco  . Never Used     Counseling given: Not Answered   Past Medical History:  Diagnosis Date  . Anemia   . Anxiety   . Asthma   . Atrial fibrillation ([place])   . Bipolar 1 disorder ([place])   . 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 CHF (congestive heart failure) ([place])   . Chronic back pain   . Chronic bronchitis ([place])   . Colon polyps   . Complication of anesthesia    postop nausea/vomiting  . COPD (chronic obstructive pulmonary disease) ([place])   . Coronary artery disease   . Depression   . Family history of adverse reaction to anesthesia    Uncle was positive for malignant hyperthermia; patient had testing done and was negative.  . Fibromyalgia   . GERD (gastroesophageal reflux disease)   . Gout   . High cholesterol   . History of blood transfusion 06/2015   "bleeding from my rectum"  . History of  hiatal hernia   . HOH (hard of hearing)   . Hx of colonic polyps 03/21/2016   3 small adenomas no recall - co-morbidities  . Myocardial infarction 1990   "mild"  . 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 On home oxygen therapy    "3L; 24/7" (03/14/2016)  . OSA treated with BiPAP    uses biPAP, 10 (03/14/2016)  . Osteoarthritis   . Pneumonia   . Shortness of breath dyspnea   . Type II diabetes mellitus ([place])    Past Surgical History:  Procedure Laterality Date  . APPENDECTOMY     "they busted"  . bladder stimulator     pt states, "it  cannot be turned off; it's in my right hip; dead battery so it's not working anymore". (03/14/2016)  . BLADDER SUSPENSION     2003, 2006 and 2010  . CATARACT EXTRACTION W/PHACO Right 11/29/2014   Procedure: CATARACT EXTRACTION PHACO AND INTRAOCULAR LENS PLACEMENT (IOC);  Surgeon: Rutherford Guys, MD;  Location: [facility];  Service: Ophthalmology;  Laterality: Right;  CDE:3.81  . CATARACT EXTRACTION W/PHACO Left 12/13/2014   Procedure: CATARACT EXTRACTION PHACO AND INTRAOCULAR LENS PLACEMENT (IOC);  Surgeon: Rutherford Guys, MD;  Location: [facility];  Service: Ophthalmology;  Laterality: Left;  CDE:6.59  . CERVICAL DISC SURGERY N/A 2009   4, 6, and 7 cervical disc replaced  . COLONOSCOPY WITH PROPOFOL N/A 03/15/2016   Procedure: COLONOSCOPY WITH PROPOFOL;  Surgeon: Gatha Mayer, MD;  Location: [facility];  Service: Endoscopy;  Laterality: N/A;  . ESOPHAGOGASTRODUODENOSCOPY (EGD) WITH PROPOFOL N/A 03/15/2016   Procedure: ESOPHAGOGASTRODUODENOSCOPY (EGD) WITH PROPOFOL;  Surgeon: Gatha Mayer, MD;  Location: [facility];  Service: Endoscopy;  Laterality: N/A;  . HEEL SPUR SURGERY Bilateral   . HERNIA REPAIR    . I&D EXTREMITY Right 06/13/2015   Procedure: MINOR IRRIGATION AND DEBRIDEMENT EXTREMITY REMOVAL OF NAIL;  Surgeon: Daryll Brod, MD;  Location: [place] [place];  Service: Orthopedics;  Laterality: Right;  . TUBAL LIGATION    . UMBILICAL HERNIA REPAIR      w/mesh   Family History  Problem Relation Age of Onset  . Heart disease Mother   . COPD Mother   . Diabetes Mother   . Breast cancer Mother   . Heart disease Father   . Hyperlipidemia Father   . COPD Sister   . Heart disease Sister   . Diabetes Sister   . Heart disease Maternal Grandmother    History  Sexual Activity  . Sexual activity: No    Outpatient Encounter Prescriptions as of 04/19/2016  Medication Sig  . albuterol (PROVENTIL) (2.5 MG/3ML) 0.083% nebulizer solution Take 3 mLs (2.5 mg total) by nebulization every 6 (six) hours as needed for wheezing or shortness of breath.  Marland Kitchen atorvastatin (LIPITOR) 20 MG tablet Take 1 tablet (20 mg total) by mouth daily.  . budesonide (PULMICORT) 0.5 MG/2ML nebulizer solution USE 2 ML (0.5 MG TOTAL) VIA NEBULIZER TWICE A DAY  . cetirizine (ZYRTEC) 10 MG tablet Take 10 mg by mouth at bedtime.  Marland Kitchen diltiazem (CARDIZEM) 30 MG tablet Take 1 tablet (30 mg total) by mouth 2 (two) times daily.  Marland Kitchen docusate sodium (COLACE) 100 MG capsule Take 100 mg by mouth daily.  . ergocalciferol (VITAMIN D2) 50000 units capsule Take 1 capsule by mouth every two weeks.  Marland Kitchen escitalopram (LEXAPRO) 10 MG tablet Take 10 mg by mouth at bedtime.   . fenofibrate 54 MG tablet Take 1 tablet (54 mg total) by mouth daily.  . ferrous sulfate 324 (65 Fe) MG TBEC Take 1 tablet by mouth daily.  . fluticasone (FLONASE) 50 MCG/ACT nasal spray Place 1 spray into both nostrils daily as needed for allergies or rhinitis.  . furosemide (LASIX) 40 MG tablet Take 2 tablets (80 mg total) by mouth 2 (two) times daily. (Patient taking differently: Take 60 mg by mouth 2 (two) times daily. )  . Insulin Glargine (TOUJEO SOLOSTAR) 300 UNIT/ML SOPN Inject 40-50 Units into the skin 2 times daily at 12 noon and 4 pm. (Patient taking differently: Inject 40-50 Units into the skin See admin instructions. Inject 40 units subque in the morning and inject 50 units  subque in the evening.)  . insulin  lispro (HUMALOG KWIKPEN) 100 UNIT/ML KiwkPen Inject 25-35 units under skin 3x a day (Patient taking differently: Inject 26-28 Units into the skin See admin instructions. Inject 26 units subque in the morning, 28 units subque before lunch and 28 units subque before dinner)  . Insulin Pen Needle 32G X 4 MM MISC Use to inject insulin 4 times daily.  Marland Kitchen ipratropium-albuterol (DUONEB) 0.5-2.5 (3) MG/3ML SOLN Take 3 mLs by nebulization 4 (four) times daily.  . metFORMIN (GLUCOPHAGE-XR) 500 MG 24 hr tablet Take 1 tablet (500 mg total) by mouth daily with breakfast. (Patient taking differently: Take 500 mg by mouth 2 (two) times daily. )  . metolazone (ZAROXOLYN) 2.5 MG tablet Take 1 tablet (2.5 mg total) by mouth daily.  . mirtazapine (REMERON) 15 MG tablet Take 15 mg by mouth at bedtime.  . modafinil (PROVIGIL) 200 MG tablet Take 1 tablet (200 mg total) by mouth daily.  . montelukast (SINGULAIR) 10 MG tablet Take 1 tablet (10 mg total) by mouth at bedtime.  . mupirocin cream (BACTROBAN) 2 % Apply 1 application topically 2 (two) times daily.  . nitroGLYCERIN (NITROSTAT) 0.4 MG SL tablet Place 1 tablet (0.4 mg total) under the tongue every 5 (five) minutes as needed for chest pain.  Marland Kitchen nystatin (MYCOSTATIN/NYSTOP) 100000 UNIT/GM POWD Apply topically 2 (two) times daily. Apply under breasts  . ondansetron (ZOFRAN-ODT) 4 MG disintegrating tablet Take 1 tablet (4 mg total) by mouth 2 (two) times daily as needed for nausea or vomiting.  Marland Kitchen oxyCODONE (OXY IR/ROXICODONE) 5 MG immediate release tablet Take 5 mg by mouth 2 (two) times daily.  . OXYGEN Inhale 3 L into the lungs continuous. 3 liters 24/7  . pantoprazole (PROTONIX) 20 MG tablet Take 1 tablet (20 mg total) by mouth 2 (two) times daily before a meal.  . potassium chloride (K-DUR) 10 MEQ tablet Take 2 tablets (20 mEq total) by mouth daily.  . potassium chloride SA (K-DUR,KLOR-CON) 20 MEQ tablet Take 1 tablet (20 mEq total) by mouth 3 (three) times daily.  Marland Kitchen  PRESCRIPTION MEDICATION Inhale into the lungs See admin instructions. Use BIPAP every time laying down  . primidone (MYSOLINE) 50 MG tablet Take 1 tablet (50 mg total) by mouth at bedtime.  . Probiotic Product (PROBIOTIC PO) Take 1 tablet by mouth at bedtime.  . triamcinolone lotion (KENALOG) 0.1 % Apply 1 application topically 3 (three) times daily. For up to 14 days and as needed  . VITAMIN E PO Take 1 tablet by mouth daily.  . Vitamins A & D (VITAMIN A & D PO) Take by mouth.   No facility-administered encounter medications on file as of 04/19/2016.     Activities of Daily Living In your present state of health, do you have any difficulty performing the following activities: 04/19/2016 03/14/2016  Hearing? (No Data) -  Vision? - -  Difficulty concentrating or making decisions? - -  Walking or climbing stairs? - -  Dressing or bathing? - -  Doing errands, shopping? - Y  Some recent data might be hidden   Vision see note above; exam completed Hearing; working with SW to get free hearing aid through [facility]; no issues with failure; very good historian overall;  ADL's mostly dependent IADL's dependent  Patient Care Team: Betty G Martinique, MD as PCP - General (Family Medicine) Chesley Mires, MD as Consulting Physician (Pulmonary Disease) Hurman Horn, MD as Consulting Physician (Ophthalmology) Philis Kendall, MD (  Ophthalmology) Eustace Quail Tat, DO as Consulting Physician (Neurology)  Dr. Cruzita Lederer for fup of diabetes  [facility] in [place]     Assessment:    To see Dr. Martinique 11/29 at 9am;  Will schedule at [facility] on health coach schedule to review Advanced directives with visit; the patient agreed.  Review for health history including a functional assessment, fall risk, depression screen, memory loss, vision and hearing screens; Was educated and referred as appropriate.    Psychosocial risk reviewed as stress; unresolved grief; pain; lack of support; lack of income to  buy groceries, meds etc. Given resources for spouse to review; can answer questions when she comes back in the 29th  Behavioral risk addressed such as tobacco, ETOH; diet (metabolic syndrome) and exercise; exercise being arranged by pulmonary   Risk for independent living or long term plan  At risk if spouse is incapacitated   Risk for safety; Bathroom; community; firearms, sun protection; auto accidents; the patient is aware of her risk. Division of the blind is coming out to fup   All immunizations and overdue screens were reviewed for a plan or follow-up.   Labs were reviewed in regard to Lipids and A1c if appropriate. Deferred to endocrinologist   Discussed Recommended screenings and documented any personalized health advice and referrals for preventive counseling.  See AVS for patient instructions;     Exercise Activities and Dietary recommendations    Goals    . patient          Goals is to start pulmonary rehab       Fall Risk Fall Risk  04/19/2016 01/29/2016 12/07/2015  Falls in the past year? Yes Yes Yes  Number falls in past yr: 2 or more 2 or more 1  Injury with Fall? - Yes No  Risk for fall due to : Impaired balance/gait - -  Follow up - Falls evaluation completed Falls evaluation completed   Depression Screen PHQ 2/9 Scores 04/19/2016  PHQ - 2 Score 0     Cognitive Function MMSE - Mini Mental State Exam 04/19/2016  Not completed: (No Data)      ad8 score 0 negative at present  Immunization History  Administered Date(s) Administered  . Influenza Split 03/03/2014  . Influenza,inj,Quad PF,36+ Mos 01/30/2016  . Influenza-Unspecified 02/06/2015  . Pneumococcal Conjugate-13 01/30/2016   Screening Tests Health Maintenance  Topic Date Due  . Hepatitis C Screening  05-06-1951  . FOOT EXAM  03/20/1961  . OPHTHALMOLOGY EXAM  03/20/1961  . URINE MICROALBUMIN  03/20/1961  . HIV Screening  03/20/1966  . TETANUS/TDAP  03/20/1970  . ZOSTAVAX  03/21/2011  .  DEXA SCAN  03/20/2016  . HEMOGLOBIN A1C  09/12/2016  . PNA vac Low Risk Adult (2 of 2 - PPSV23) 01/29/2017  . MAMMOGRAM  10/31/2017  . PAP SMEAR  04/10/2018  . COLONOSCOPY  03/15/2026  . INFLUENZA VACCINE  Completed      Plan:   Deferred HIV   Agreed to Hep c to be drawn today  Completed foot exam  Eye exam completed- dr. Rosary Lively  Is in process of getting a free hearing aid through the centers for Hard of Hearing  Dexa due; agreed to have done next year when she has her mammogram in June;  Plans to start Pulmonary rehab at [facility]; is being evaluated.  Deferred Microalbumin due to limitations and standing and fall risk  Blood draw done today for apt scheduled later in November  Will try  to plan apt with Dr. Martinique to review Advanced Directive  States Social work is assisting her.  Plan  Referrals:  Already in process of getting hearing aid Agreed to dexa scan with mammogram next year TO NOTE: [facility] that this patient can have a dexa with mammogram next year; order signed by Dr. Martinique and faxed.  Possible referral to Urology at tertiary center due to implanted pump  The patient agrees to: Discuss urinary issues or possible referral due to implanted pump to bladder with Dr. Martinique  Advanced directive: To bring her copy's and review with Manuela Schwartz next visit  Discuss with Doctor on next fup: Needs her ears cleaned  HIV review if she wants testing   During the course of the visit the patient was educated and counseled about the following appropriate screening and preventive services:   Vaccines to include Pneumoccal, Influenza, Hepatitis B, Td, Zostavax, HCV  Electrocardiogram  Cardiovascular Disease  Colorectal cancer screening- completed 03/2016  Bone density screening deferred to mammogram next year  Diabetes screening per endo  Glaucoma screening- neg  Mammography/neg  GYN; [facility] in [place]  Nutrition  counseling   Patient Instructions (the written plan) was given to the patient.   Wynetta Fines, RN  04/19/2016

## 2016-04-19 NOTE — Telephone Encounter (Signed)
  Received a fax from [facility] for renewal of patient's Modafinil 200mg   modafinil 200mg  Take 1 tablet by mouth daily,  #90 Last refilled 10/09/15, #90 x zero refills  Please advise if okay to refill this as there as nothing mentioned last OV about being on this medication.  Please advise on how many refills for the 90-day supply. Thanks.   VS please advise

## 2016-04-19 NOTE — Telephone Encounter (Signed)
Okay to send refill. 

## 2016-04-20 LAB — HEPATITIS C ANTIBODY: HCV AB: NEGATIVE

## 2016-04-22 NOTE — Progress Notes (Signed)
I have reviewed documentation from this visit and I agree with recommendations given.  Quynn Vilchis G. Shawni Volkov, MD  [facility]. [place] office.   

## 2016-04-23 ENCOUNTER — Other Ambulatory Visit: Payer: Self-pay | Admitting: Family Medicine

## 2016-04-23 DIAGNOSIS — Z1231 Encounter for screening mammogram for malignant neoplasm of breast: Secondary | ICD-10-CM

## 2016-04-24 DIAGNOSIS — G894 Chronic pain syndrome: Secondary | ICD-10-CM | POA: Diagnosis not present

## 2016-04-24 DIAGNOSIS — M5442 Lumbago with sciatica, left side: Secondary | ICD-10-CM | POA: Diagnosis not present

## 2016-04-24 DIAGNOSIS — G603 Idiopathic progressive neuropathy: Secondary | ICD-10-CM | POA: Diagnosis not present

## 2016-04-24 DIAGNOSIS — M542 Cervicalgia: Secondary | ICD-10-CM | POA: Diagnosis not present

## 2016-04-24 DIAGNOSIS — Z79891 Long term (current) use of opiate analgesic: Secondary | ICD-10-CM | POA: Diagnosis not present

## 2016-04-24 NOTE — Telephone Encounter (Signed)
This has been faxed back to [facility]. Will sign off.

## 2016-04-29 ENCOUNTER — Telehealth: Payer: Self-pay | Admitting: Emergency Medicine

## 2016-04-29 NOTE — Telephone Encounter (Signed)
Call pt and explain lab result. Pt verbalized understanding.

## 2016-05-01 ENCOUNTER — Ambulatory Visit: Payer: Medicare Other | Admitting: Family Medicine

## 2016-05-02 ENCOUNTER — Encounter (HOSPITAL_COMMUNITY)
Admission: RE | Admit: 2016-05-02 | Discharge: 2016-05-02 | Disposition: A | Discharge status: Home / Self Care | Payer: Medicare Other | Source: Ambulatory Visit | Attending: Pulmonary Disease | Admitting: Pulmonary Disease

## 2016-05-02 VITALS — BP 108/50 | HR 85 | Ht 59.0 in | Wt 194.2 lb

## 2016-05-02 DIAGNOSIS — Z794 Long term (current) use of insulin: Secondary | ICD-10-CM | POA: Diagnosis not present

## 2016-05-02 DIAGNOSIS — J449 Chronic obstructive pulmonary disease, unspecified: Secondary | ICD-10-CM

## 2016-05-02 DIAGNOSIS — Z7982 Long term (current) use of aspirin: Secondary | ICD-10-CM | POA: Insufficient documentation

## 2016-05-02 DIAGNOSIS — Z79899 Other long term (current) drug therapy: Secondary | ICD-10-CM | POA: Insufficient documentation

## 2016-05-02 NOTE — Progress Notes (Signed)
Subjective: She is here for initial evaluation for pulmonary rehabilitation. She says she has had problems with her lungs for about 15 years. She sees Dr. Halford Chessman in [place]. She is on oxygen. She uses a scooter for walking long distances but says she can walk shorter distances. I reviewed her medication list. She is motivated to try rehabilitation. Other medical problems are atrial fibrillation, very poor vision, and osteoarthritis of multiple joints.  Objective: She is awake and alert. She is sitting currently on her scooter. She is wearing nasal oxygen. She is wearing eyeglasses. She is edentulous. Her chest shows diminished breath sounds her heart is irregularly irregular  Assessment: She is motivated to begin pulmonary rehabilitation. I think she can exercise but her exercise ability is going to be marginal but I would like for her to be able to try  Begin pulmonary rehabilitation

## 2016-05-02 NOTE — Progress Notes (Signed)
6 MIN NUSTEP TEST  Date: 05/02/2016 Weight: 88.1 Height: 59     REST   6-MIN   POST 2-MIN HR   85   92   77 BP   108/50   122/58   116/54 O2   99   99   100 RPE   6   11   6  RPD   7   11   7   Distance: 1372  Ex METs: 1.9  Comments: No s/s reported during or after test.

## 2016-05-02 NOTE — Progress Notes (Deleted)
[id number] is here for initial evaluation for pulmonary rehabilitation

## 2016-05-02 NOTE — Progress Notes (Signed)
Denise Freeman 66 y.o. female  Initial Psychosocial Assessment  Pt psychosocial assessment reveals pt lives with their spouse. Pt is currently unemployed, disabled. Pt hobbies include watching TV, fishing, and nitting. Pt reports her stress level is moderate. Areas of stress/anxiety include Family, and her health.  Pt does not exhibit signs of depression.  Pt shows fair  coping skills with positive outlook . Offered emotional support and reassurance. Monitor and evaluate progress toward psychosocial goal(s).  Goal(s): Improved management of stress Improved coping skills Help patient work toward returning to meaningful activities that improve patient's QOL and are attainable with patient's lung disease   05/02/2016 1:00 PM

## 2016-05-02 NOTE — Progress Notes (Signed)
Cardiac/Pulmonary Rehab Medication Review by a Pharmacist  Does the patient  feel that his/her medications are working for him/her?  yes  Has the patient been experiencing any side effects to the medications prescribed?  no  Does the patient measure his/her own blood pressure or blood glucose at home?  yes   Does the patient have any problems obtaining medications due to transportation or finances?   no  Understanding of regimen: excellent Understanding of indications: excellent Potential of compliance: excellent  Questions asked to Determine Patient Understanding of Medication Regimen:  1. What is the name of the medication?  2. What is the medication used for?  3. When should it be taken?  4. How much should be taken?  5. How will you take it?  6. What side effects should you report?  Understanding Defined as: Excellent: All questions above are correct Good: Questions 1-4 are correct Fair: Questions 1-2 are correct  Poor: 1 or none of the above questions are correct   Pharmacist comments: MS Priola is here for pulmonary rehab. We reviewed her medications.  She understands the indications and is compliant with her current regimen. She monitors her blood pressure. Patient is managing blood sugars. She receives her medications through mail order. Continue current regimen.  Thanks for the opportunity to participate in the care of this patient,  Isac Sarna, BS [place], [place] Clinical Pharmacist Pager [phone]  05/02/2016 9:38 AM

## 2016-05-02 NOTE — Progress Notes (Signed)
Pulmonary Individual Treatment Plan  Patient Details  Name: Denise Freeman MRN: [id number] Date of Birth: Feb 28, 1951 Referring Provider:   Flowsheet Row PULMONARY REHAB COPD ORIENTATION from 05/02/2016 in [place]  Referring Provider  Dr. Halford Chessman      Initial Encounter Date:  Flowsheet Row PULMONARY REHAB COPD ORIENTATION from 05/02/2016 in [facility]  Date  05/02/16  Referring Provider  Dr. 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  aspirin 81 MG chewable tablet, Chew 81 mg by mouth daily., Disp: , Rfl:  .  atorvastatin (LIPITOR) 20 MG tablet, Take 1 tablet (20 mg total) by mouth daily., Disp: 90 tablet, Rfl: 3 .  albuterol (PROVENTIL) (2.5 MG/3ML) 0.083% nebulizer solution, Take 3 mLs (2.5 mg total) by nebulization every 6 (six) hours as needed for wheezing or shortness of breath., Disp: 1350 mL, Rfl: 3 .  budesonide (PULMICORT) 0.5 MG/2ML nebulizer solution, USE 2 ML (0.5 MG TOTAL) VIA NEBULIZER TWICE A DAY, Disp: 360 mL, Rfl: 3 .  cetirizine (ZYRTEC) 10 MG tablet, Take 10 mg by mouth at bedtime., Disp: , Rfl:  .  diltiazem (CARDIZEM) 30 MG tablet, Take 1 tablet (30 mg total) by mouth 2 (two) times daily., Disp: 180 tablet, Rfl: 3 .  docusate sodium (COLACE) 100 MG capsule, Take 100 mg by mouth daily., Disp: , Rfl:  .  ergocalciferol (VITAMIN D2) 50000 units capsule, Take 1 capsule by mouth every two weeks., Disp: 7 capsule, Rfl: 0 .  escitalopram (LEXAPRO) 10 MG tablet, Take 10 mg by mouth at bedtime. , Disp: , Rfl:  .  fenofibrate 54 MG tablet, Take 1 tablet (54 mg total) by mouth daily., Disp: 90 tablet, Rfl: 3 .  ferrous sulfate 324 (65 Fe) MG TBEC, Take 1 tablet by mouth daily., Disp: , Rfl:  .  fluticasone (FLONASE) 50 MCG/ACT nasal spray, Place 1 spray into both nostrils daily as needed for allergies or rhinitis., Disp: 32 g, Rfl: 1 .   furosemide (LASIX) 40 MG tablet, Take 2 tablets (80 mg total) by mouth 2 (two) times daily. (Patient taking differently: Take 60 mg by mouth 2 (two) times daily. ), Disp: 360 tablet, Rfl: 3 .  Insulin Glargine (TOUJEO SOLOSTAR) 300 UNIT/ML SOPN, Inject 40-50 Units into the skin 2 times daily at 12 noon and 4 pm. (Patient taking differently: Inject 40-50 Units into the skin See admin instructions. Inject 40 units subque in the morning and inject 50 units subque in the evening.), Disp: 18 pen, Rfl: 1 .  insulin lispro (HUMALOG KWIKPEN) 100 UNIT/ML KiwkPen, Inject 25-35 units under skin 3x a day (Patient taking differently: Inject 26-28 Units into the skin See admin instructions. Inject 26 units subque in the morning, 28 units subque before lunch and 28 units subque before dinner), Disp: 35 pen, Rfl: 1 .  Insulin Pen Needle 32G X 4 MM MISC, Use to inject insulin 4 times daily., Disp: 400 each, Rfl: 3 .  ipratropium-albuterol (DUONEB) 0.5-2.5 (3) MG/3ML SOLN, Take 3 mLs by nebulization 4 (four) times daily., Disp: 1080 mL, Rfl: 1 .  metFORMIN (GLUCOPHAGE-XR) 500 MG 24 hr tablet, Take 1 tablet (500 mg total) by mouth daily with breakfast. (Patient taking differently: Take 500 mg by mouth 2 (two) times daily. ), Disp: 180 tablet, Rfl: 3 .  metolazone (ZAROXOLYN) 2.5 MG tablet, Take 1 tablet (2.5 mg total) by mouth  daily., Disp: 90 tablet, Rfl: 3 .  mirtazapine (REMERON) 15 MG tablet, Take 15 mg by mouth at bedtime., Disp: , Rfl:  .  modafinil (PROVIGIL) 200 MG tablet, Take 1 tablet (200 mg total) by mouth daily., Disp: 90 tablet, Rfl: 0 .  montelukast (SINGULAIR) 10 MG tablet, Take 1 tablet (10 mg total) by mouth at bedtime., Disp: 90 tablet, Rfl: 3 .  mupirocin cream (BACTROBAN) 2 %, Apply 1 application topically 2 (two) times daily., Disp: 15 g, Rfl: 0 .  nitroGLYCERIN (NITROSTAT) 0.4 MG SL tablet, Place 1 tablet (0.4 mg total) under the tongue every 5 (five) minutes as needed for chest pain., Disp: 25  tablet, Rfl: 3 .  nystatin (MYCOSTATIN/NYSTOP) 100000 UNIT/GM POWD, Apply topically 2 (two) times daily. Apply under breasts, Disp: , Rfl:  .  ondansetron (ZOFRAN-ODT) 4 MG disintegrating tablet, Take 1 tablet (4 mg total) by mouth 2 (two) times daily as needed for nausea or vomiting., Disp: 30 tablet, Rfl: 1 .  oxyCODONE (OXY IR/ROXICODONE) 5 MG immediate release tablet, Take 5 mg by mouth 2 (two) times daily., Disp: , Rfl:  .  OXYGEN, Inhale 3 L into the lungs continuous. 3 liters 24/7, Disp: , Rfl:  .  pantoprazole (PROTONIX) 20 MG tablet, Take 1 tablet (20 mg total) by mouth 2 (two) times daily before a meal., Disp: 180 tablet, Rfl: 1 .  potassium chloride SA (K-DUR,KLOR-CON) 20 MEQ tablet, Take 1 tablet (20 mEq total) by mouth 3 (three) times daily., Disp: 270 tablet, Rfl: 0 .  PRESCRIPTION MEDICATION, Inhale into the lungs See admin instructions. Use BIPAP every time laying down, Disp: , Rfl:  .  primidone (MYSOLINE) 50 MG tablet, Take 1 tablet (50 mg total) by mouth at bedtime., Disp: 90 tablet, Rfl: 1 .  Probiotic Product (PROBIOTIC PO), Take 1 tablet by mouth at bedtime., Disp: , Rfl:  .  triamcinolone lotion (KENALOG) 0.1 %, Apply 1 application topically 3 (three) times daily. For up to 14 days and as needed, Disp: 60 mL, Rfl: 1 .  VITAMIN E PO, Take 1 tablet by mouth daily., Disp: , Rfl:  .  Vitamins A & D (VITAMIN A & D PO), Take by mouth., Disp: , Rfl:   Past Medical History: Past Medical History:  Diagnosis Date  . Anemia   . Anxiety   . Asthma   . Atrial fibrillation ([place])   . Bipolar 1 disorder ([place])   . 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 CHF (congestive heart failure) ([place])   . Chronic back pain   . Chronic bronchitis ([place])   . Colon polyps   . Complication of anesthesia    postop nausea/vomiting  . COPD (chronic obstructive pulmonary disease) ([place])   . Coronary artery disease   . Depression   . Family history of adverse reaction to anesthesia     Uncle was positive for malignant hyperthermia; patient had testing done and was negative.  . Fibromyalgia   . GERD (gastroesophageal reflux disease)   . Gout   . High cholesterol   . History of blood transfusion 06/2015   "bleeding from my rectum"  . History of hiatal hernia   . HOH (hard of hearing)   . Hx of colonic polyps 03/21/2016   3 small adenomas no recall - co-morbidities  . Myocardial infarction 1990   "mild"  . 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 On home oxygen therapy    "  3L; 24/7" (03/14/2016)  . OSA treated with BiPAP    uses biPAP, 10 (03/14/2016)  . Osteoarthritis   . Pneumonia   . Shortness of breath dyspnea   . Type II diabetes mellitus ([facility])     Tobacco Use: History  Smoking Status  . Former Smoker  . Packs/day: 2.00  . Years: 14.00  . Types: Cigarettes  . Quit date: 04/12/1989  Smokeless Tobacco  . Never Used    Labs: Recent Review Flowsheet Data    Labs for ITP Cardiac and Pulmonary Rehab Latest Ref Rng & Units 06/25/2015 11/28/2015 02/28/2016 03/14/2016 04/19/2016   Cholestrol 0 - 200 mg/dL - - - - 236(H)   LDLDIRECT mg/dL - - - - 153.0   HDL >39.00 mg/dL - - - - 37.70(L)   Trlycerides 0.0 - 149.0 mg/dL - - - - 258.0(H)   Hemoglobin A1c 4.8 - 5.6 % 6.6(H) 7.8 9.0 8.9(H) -      Capillary Blood Glucose: Lab Results  Component Value Date   GLUCAP 176 (H) 03/15/2016   GLUCAP 149 (H) 03/15/2016   GLUCAP 117 (H) 03/14/2016   GLUCAP 137 (H) 03/14/2016   GLUCAP 152 (H) 06/26/2015     ADL UCSD:     Pulmonary Assessment Scores    Row Name 05/02/16 1149         ADL UCSD   ADL Phase Entry     SOB Score total 62     Rest 2     Walk 11     Stairs 5     Bath 3     Dress 3       CAT Score   CAT Score 31       mMRC Score   mMRC Score 2        Pulmonary Function Assessment:     Pulmonary Function Assessment - 05/02/16 1120      Pulmonary Function Tests   FVC% 31 %   FEV1% 22 %   FEV1/FVC Ratio 70   RV% 1.88 %   DLCO% 19.55 %      Initial Spirometry Results   FVC% 35 %   FEV1% 30 %   FEV1/FVC Ratio 65   Comments Patient is lelgally blind and rides a skooter to get around.     Post Bronchodilator Spirometry Results   FVC% 35 %   FEV1% 30 %   FEV1/FVC Ratio 65     Breath   Bilateral Breath Sounds Decreased   Shortness of Breath Yes      Exercise Target Goals: Date: 05/02/16  Exercise Program Goal: Individual exercise prescription set with THRR, safety & activity barriers. Participant demonstrates ability to understand and report RPE using BORG scale, to self-measure pulse accurately, and to acknowledge the importance of the exercise prescription.  Exercise Prescription Goal: Starting with aerobic activity 30 plus minutes a day, 3 days per week for initial exercise prescription. Provide home exercise prescription and guidelines that participant acknowledges understanding prior to discharge.  Activity Barriers & Risk Stratification:     Activity Barriers & Cardiac Risk Stratification - 05/02/16 1101      Activity Barriers & Cardiac Risk Stratification   Activity Barriers Shortness of Breath;History of Falls;Assistive Device;Other (comment)   Comments Legally Blind   Cardiac Risk Stratification High      6 Minute Nu-Step Test:  Date: 05/02/2016 Weight: 88.1 Height: 59  REST                           6-MIN                          POST 2-MIN HR                               85                                92                                77 BP                               108/50                         122/58                         116/54 O2                               99                                99                                100 RPE                             6                                  11                                6  RPD                             7                                  11                                 7   Distance: 1372  Ex METs: 1.9  Comments: No s/s reported during or after test.    Initial Exercise Prescription:     Initial Exercise Prescription - 05/02/16 1100      Date of Initial Exercise RX and Referring Provider   Date 05/02/16   Referring Provider Dr. Halford Chessman     Oxygen   Oxygen Continuous   Liters 3     NuStep   Level 2  Watts 15   Minutes 15   METs 1.8     Arm Ergometer   Level 1.5   Watts 15   Minutes 20   METs 1.9     Prescription Details   Frequency (times per week) 3   Duration Progress to 30 minutes of continuous aerobic without signs/symptoms of physical distress     Intensity   THRR REST +  20   THRR 40-80% of Max Heartrate 113-127   Ratings of Perceived Exertion 11-13   Perceived Dyspnea 0-4     Progression   Progression Continue progressive overload as per policy without signs/symptoms or physical distress.     Resistance Training   Training Prescription Yes   Weight 1   Reps 10-12      Perform Capillary Blood Glucose checks as needed.  Exercise Prescription Changes:   Exercise Comments:   Discharge Exercise Prescription (Final Exercise Prescription Changes):   Nutrition:  Target Goals: Understanding of nutrition guidelines, daily intake of sodium 1500mg , cholesterol 200mg , calories 30% from fat and 7% or less from saturated fats, daily to have 5 or more servings of fruits and vegetables.  Biometrics:     Pre Biometrics - 05/02/16 1141      Pre Biometrics   Height 4\' 11"  (1.499 m)   Weight 194 lb 3.6 oz (88.1 kg)   Waist Circumference 46 inches   Hip Circumference 43 inches   Waist to Hip Ratio 1.07 %   BMI (Calculated) 39.3   Triceps Skinfold 7 mm   % Body Fat 43.1 %   Grip Strength 57.67 kg   Flexibility 0 in   Single Leg Stand 5 seconds       Nutrition Therapy Plan and Nutrition Goals:   Nutrition Discharge: Rate Your Plate Scores:     Nutrition Assessments - 05/02/16 1232      Rate Your Plate  Scores   Pre Score 51      Psychosocial: Target Goals: Acknowledge presence or absence of depression, maximize coping skills, provide positive support system. Participant is able to verbalize types and ability to use techniques and skills needed for reducing stress and depression.  Initial Review & Psychosocial Screening:     Initial Psych Review & Screening - 05/02/16 [place]? Yes     Barriers   Psychosocial barriers to participate in program There are no identifiable barriers or psychosocial needs.     Screening Interventions   Interventions Encouraged to exercise      Quality of Life Scores:     Quality of Life - 05/02/16 1143      Quality of Life Scores   Health/Function Pre 18.94 %   Socioeconomic Pre 19.92 %   Psych/Spiritual Pre 29.64 %   Family Pre 26.4 %   GLOBAL Pre 22.41 %      PHQ-9: Recent Review Flowsheet Data    Depression screen [facility] 2/9 05/02/2016 04/19/2016   Decreased Interest 0 0   Down, Depressed, Hopeless 1 0   PHQ - 2 Score 1 0   Altered sleeping 1 -   Tired, decreased energy 1 -   Change in appetite 1 -   Feeling bad or failure about yourself  1 -   Trouble concentrating 0 -   Moving slowly or fidgety/restless 0 -   Suicidal thoughts 0 -   PHQ-9 Score 5 -   Difficult doing work/chores Somewhat difficult -  Psychosocial Evaluation and Intervention:     Psychosocial Evaluation - 05/02/16 1245      Psychosocial Evaluation & Interventions   Interventions Encouraged to exercise with the program and follow exercise prescription   Comments Patient has no psychosocial barriers identified.    Continued Psychosocial Services Needed No      Psychosocial Re-Evaluation:   Education: Education Goals: Education classes will be provided on a weekly basis, covering required topics. Participant will state understanding/return demonstration of topics presented.  Learning Barriers/Preferences:      Learning Barriers/Preferences - 05/02/16 1102      Learning Barriers/Preferences   Learning Barriers None   Learning Preferences Written Material;Video;Group Instruction      Education Topics: How Lungs Work and Diseases: - Discuss the anatomy of the lungs and diseases that can affect the lungs, such as COPD.   Exercise: -Discuss the importance of exercise, FITT principles of exercise, normal and abnormal responses to exercise, and how to exercise safely.   Environmental Irritants: -Discuss types of environmental irritants and how to limit exposure to environmental irritants.   Meds/Inhalers and oxygen: - Discuss respiratory medications, definition of an inhaler and oxygen, and the proper way to use an inhaler and oxygen.   Energy Saving Techniques: - Discuss methods to conserve energy and decrease shortness of breath when performing activities of daily living.    Bronchial Hygiene / Breathing Techniques: - Discuss breathing mechanics, pursed-lip breathing technique,  proper posture, effective ways to clear airways, and other functional breathing techniques   Cleaning Equipment: - Provides group verbal and written instruction about the health risks of elevated stress, cause of high stress, and healthy ways to reduce stress.   Nutrition I: Fats: - Discuss the types of cholesterol, what cholesterol does to the body, and how cholesterol levels can be controlled.   Nutrition II: Labels: -Discuss the different components of food labels and how to read food labels.   Respiratory Infections: - Discuss the signs and symptoms of respiratory infections, ways to prevent respiratory infections, and the importance of seeking medical treatment when having a respiratory infection.   Stress I: Signs and Symptoms: - Discuss the causes of stress, how stress may lead to anxiety and depression, and ways to limit stress.   Stress II: Relaxation: -Discuss relaxation techniques to  limit stress.   Oxygen for Home/Travel: - Discuss how to prepare for travel when on oxygen and proper ways to transport and store oxygen to ensure safety.   Knowledge Questionnaire Score:     Knowledge Questionnaire Score - 05/02/16 1104      Knowledge Questionnaire Score   Pre Score 6/14      Core Components/Risk Factors/Patient Goals at Admission:     Personal Goals and Risk Factors at Admission - 05/02/16 1241      Core Components/Risk Factors/Patient Goals on Admission    Weight Management Yes;Obesity   Intervention Weight Management: Develop a combined nutrition and exercise program designed to reach desired caloric intake, while maintaining appropriate intake of nutrient and fiber, sodium and fats, and appropriate energy expenditure required for the weight goal.   Increase Strength and Stamina Yes   Intervention Provide advice, education, support and counseling about physical activity/exercise needs.;Develop an individualized exercise prescription for aerobic and resistive training based on initial evaluation findings, risk stratification, comorbidities and participant's personal goals.   Expected Outcomes Achievement of increased cardiorespiratory fitness and enhanced flexibility, muscular endurance and strength shown through measurements of functional capacity and personal statement of participant.  Improve shortness of breath with ADL's Yes   Intervention Provide education, individualized exercise plan and daily activity instruction to help decrease symptoms of SOB with activities of daily living.   Expected Outcomes Short Term: Achieves a reduction of symptoms when performing activities of daily living.   Develop more efficient breathing techniques such as purse lipped breathing and diaphragmatic breathing; and practicing self-pacing with activity Yes   Intervention Provide education, demonstration and support about specific breathing techniuqes utilized for more efficient  breathing. Include techniques such as pursed lipped breathing, diaphragmatic breathing and self-pacing activity.   Expected Outcomes Short Term: Participant will be able to demonstrate and use breathing techniques as needed throughout daily activities.   Increase knowledge of respiratory medications and ability to use respiratory devices properly  Yes   Intervention Provide education and demonstration as needed of appropriate use of medications, inhalers, and oxygen therapy.   Expected Outcomes Short Term: Achieves understanding of medications use. Understands that oxygen is a medication prescribed by physician. Demonstrates appropriate use of inhaler and oxygen therapy.   Intervention Provide education about proper nutrition, including hydration, and aerobic/resistive exercise prescription along with prescribed medications to achieve blood glucose in normal ranges: Fasting glucose 65-99 mg/dL   Expected Outcomes Long Term: Attainment of HbA1C < 7%.   Personal Goal Other Yes   Personal Goal Get stronger and stay stronger. Do her regular ADL's without SOB.    Intervention Patient will exercise 2 days/week in pulmonary rehab and supplement at home with exercise 3 days/week.    Expected Outcomes Patient will meet her personal goals.       Core Components/Risk Factors/Patient Goals Review:      Goals and Risk Factor Review    Row Name 05/02/16 1244             Core Components/Risk Factors/Patient Goals Review   Personal Goals Review Weight Management/Obesity;Increase Strength and Stamina;Improve shortness of breath with ADL's          Core Components/Risk Factors/Patient Goals at Discharge (Final Review):      Goals and Risk Factor Review - 05/02/16 1244      Core Components/Risk Factors/Patient Goals Review   Personal Goals Review Weight Management/Obesity;Increase Strength and Stamina;Improve shortness of breath with ADL's      ITP Comments:   Comments: Patient arrived for 1st  visit/orientation/education at 0815. Patient was referred to [facility] by [facility] due to COPD/Asthma (J44.9). During orientation advised patient on arrival and appointment times what to wear, what to do before, during and after exercise. Reviewed attendance and class policy. Talked about inclement weather and class consultation policy. Pt is scheduled to return Pulmonary Rehab on 05/07/16 at 1:30 pm. Pt was advised to come to class 15 minutes before class starts. Patient was also given instructions on meeting with the dietician and attending the Family Structure classes. Pt is eager to get started. Patient participated in warm-up stretches including weighs and resistance training. Patient was able to complete 6 minute Nu-Step test. Patient was measured for the equipment. Discussed equipment safety with patient. Took patient pre-anthropometric measurements. Patient finished visit at 1100.

## 2016-05-07 ENCOUNTER — Encounter (HOSPITAL_COMMUNITY)
Admission: RE | Admit: 2016-05-07 | Discharge: 2016-05-07 | Disposition: A | Discharge status: Home / Self Care | Payer: Medicare Other | Source: Ambulatory Visit | Attending: Pulmonary Disease | Admitting: Pulmonary Disease

## 2016-05-07 DIAGNOSIS — J449 Chronic obstructive pulmonary disease, unspecified: Secondary | ICD-10-CM | POA: Insufficient documentation

## 2016-05-07 DIAGNOSIS — J42 Unspecified chronic bronchitis: Secondary | ICD-10-CM | POA: Diagnosis not present

## 2016-05-07 NOTE — Progress Notes (Signed)
Daily Session Note  Patient Details  Name: Denise Freeman MRN: [id number] Date of Birth: 1950-09-19 Referring Provider:   Flowsheet Row PULMONARY REHAB COPD ORIENTATION from 05/02/2016 in [place]  Referring Provider  Dr. Halford Chessman      Encounter Date: 05/07/2016  Check In:     Session Check In - 05/07/16 1330      Check-In   Location [facility]   Staff Present Dazhane Villagomez Angelina Pih, MS, EP, [facility], Exercise Physiologist;Gregory Luther Parody, BS, EP, Exercise Physiologist   Supervising physician immediately available to respond to emergencies See telemetry face sheet for immediately available MD   Medication changes reported     No   Fall or balance concerns reported    Yes   Warm-up and Cool-down Performed as group-led instruction   Resistance Training Performed Yes   VAD Patient? No     Pain Assessment   Currently in Pain? No/denies   Pain Score 0-No pain   Multiple Pain Sites No      Capillary Blood Glucose: No results found for this or any previous visit (from the past 24 hour(s)).   Goals Met:  Independence with exercise equipment Exercise tolerated well No report of cardiac concerns or symptoms Strength training completed today  Goals Unmet:  Not Applicable  Comments: Check out: 2:30   Dr. Sinda Du is Medical Director for [facility] Pulmonary Rehab.

## 2016-05-09 ENCOUNTER — Encounter (HOSPITAL_COMMUNITY)
Admission: RE | Admit: 2016-05-09 | Discharge: 2016-05-09 | Disposition: A | Discharge status: Home / Self Care | Payer: Medicare Other | Source: Ambulatory Visit | Attending: Pulmonary Disease | Admitting: Pulmonary Disease

## 2016-05-09 DIAGNOSIS — J42 Unspecified chronic bronchitis: Secondary | ICD-10-CM | POA: Diagnosis not present

## 2016-05-09 DIAGNOSIS — J449 Chronic obstructive pulmonary disease, unspecified: Secondary | ICD-10-CM

## 2016-05-09 NOTE — Progress Notes (Signed)
Daily Session Note  Patient Details  Name: Denise Freeman MRN: [id number] Date of Birth: 01-14-1951 Referring Provider:   Flowsheet Row PULMONARY REHAB COPD ORIENTATION from 05/02/2016 in [place]  Referring Provider  Dr. Halford Chessman      Encounter Date: 05/09/2016  Check In:     Session Check In - 05/09/16 1045      Check-In   Location [facility]   Staff Present Aundra Dubin, RN, BSN;Nardos Putnam Luther Parody, BS, EP, Exercise Physiologist   Supervising physician immediately available to respond to emergencies See telemetry face sheet for immediately available MD   Medication changes reported     No   Fall or balance concerns reported    No   Warm-up and Cool-down Performed as group-led instruction   Resistance Training Performed Yes   VAD Patient? No     Pain Assessment   Currently in Pain? No/denies   Pain Score 0-No pain   Multiple Pain Sites No      Capillary Blood Glucose: No results found for this or any previous visit (from the past 24 hour(s)).   Goals Met:  Independence with exercise equipment Improved SOB with ADL's Using PLB without cueing & demonstrates good technique Exercise tolerated well No report of cardiac concerns or symptoms Strength training completed today  Goals Unmet:  Not Applicable  Comments: Check out 1145   Dr. Sinda Du is Medical Director for [facility].

## 2016-05-13 DIAGNOSIS — F424 Excoriation (skin-picking) disorder: Secondary | ICD-10-CM | POA: Diagnosis not present

## 2016-05-13 DIAGNOSIS — F633 Trichotillomania: Secondary | ICD-10-CM | POA: Diagnosis not present

## 2016-05-14 ENCOUNTER — Encounter (HOSPITAL_COMMUNITY)
Admission: RE | Admit: 2016-05-14 | Discharge: 2016-05-14 | Disposition: A | Discharge status: Home / Self Care | Payer: Medicare Other | Source: Ambulatory Visit | Attending: Pulmonary Disease | Admitting: Pulmonary Disease

## 2016-05-14 DIAGNOSIS — J449 Chronic obstructive pulmonary disease, unspecified: Secondary | ICD-10-CM

## 2016-05-14 DIAGNOSIS — J42 Unspecified chronic bronchitis: Secondary | ICD-10-CM | POA: Diagnosis not present

## 2016-05-14 NOTE — Progress Notes (Signed)
Daily Session Note  Patient Details  Name: Denise Freeman MRN: [id number] Date of Birth: 1950/12/14 Referring Provider:   Flowsheet Row PULMONARY REHAB COPD ORIENTATION from 05/02/2016 in [place]  Referring Provider  Dr. Halford Chessman      Encounter Date: 05/14/2016  Check In:     Session Check In - 05/14/16 1330      Check-In   Location [facility]   Staff Present Suzanne Boron, BS, EP, Exercise Physiologist   Supervising physician immediately available to respond to emergencies See telemetry face sheet for immediately available MD   Medication changes reported     No   Fall or balance concerns reported    No   Warm-up and Cool-down Performed as group-led instruction   Resistance Training Performed Yes   VAD Patient? No     Pain Assessment   Currently in Pain? No/denies   Pain Score 0-No pain   Multiple Pain Sites No      Capillary Blood Glucose: No results found for this or any previous visit (from the past 24 hour(s)).   Goals Met:  Independence with exercise equipment Improved SOB with ADL's Using PLB without cueing & demonstrates good technique Exercise tolerated well No report of cardiac concerns or symptoms Strength training completed today  Goals Unmet:  Not Applicable  Comments: Check out 230   Dr. Sinda Du is Medical Director for [facility].

## 2016-05-16 ENCOUNTER — Encounter (HOSPITAL_COMMUNITY)
Admission: RE | Admit: 2016-05-16 | Discharge: 2016-05-16 | Disposition: A | Discharge status: Home / Self Care | Payer: Medicare Other | Source: Ambulatory Visit | Attending: Pulmonary Disease | Admitting: Pulmonary Disease

## 2016-05-16 DIAGNOSIS — J449 Chronic obstructive pulmonary disease, unspecified: Secondary | ICD-10-CM

## 2016-05-16 DIAGNOSIS — J42 Unspecified chronic bronchitis: Secondary | ICD-10-CM | POA: Diagnosis not present

## 2016-05-16 NOTE — Progress Notes (Signed)
She is here for scheduled recheck for pulmonary rehab. This is a 30 day evaluation. I have reviewed all of the other nodes and she seems to be doing okay. She is limited in her ability to exercise because of the weakness of her legs but she does feel like she is improving and that her outlook is better. She feels that she has benefited from the instruction regarding breathing exercises, pursed lip breathing etc.  Exam: She is awake and alert and in no acute distress. She is sitting on her scooter. Her respiratory effort is normal. Her lungs are clear. She does not have any edema.  She is clearly benefiting from participating in pulmonary rehabilitation. She plans to continue and I think that's appropriate.

## 2016-05-16 NOTE — Progress Notes (Signed)
Denise Freeman 65 y.o. female  54 Day Psychosocial Note  Patient psychosocial assessment reveals no barriers to participation in Pulmonary Rehab. Psychosocial areas that are currently affecting patient's rehab experience include concerns about health issues and emotional problems as evidenced by Bipolar Disorder. Patient does continue to exhibit negative coping skills to deal with her psychosocial concerns. Patient had an emotional outburst during a rehab session. Offered emotional support and reassurance. She was able to calm down and continue the session. Patient does feel she is making progress toward Pulmonary Rehab goals. Patient reports her health and activity level has not improved in the past 30 days as evidenced by patient's report of not significantly changed ability to cope with stressful situations. Patient states family/friends have not noticed changes in her activity or mood. Patient reports feeling positive about current and projected progression in Pulmonary Rehab. After reviewing the patient's treatment plan, the patient is making progress toward Pulmonary Rehab goals. [name] rate of progress toward rehab goals is fair. Plan of action to help patient continue to work towards rehab goals include continuing to attend weekly sessions. Will continue to monitor and evaluate progress toward psychosocial goal(s).  Goal(s) in progress: Improved management of stress and Bipolar Disorder Improved coping skills Help patient work toward returning to meaningful activities that improve patient's QOL and are attainable with patient's lung disease

## 2016-05-16 NOTE — Progress Notes (Signed)
Pulmonary Individual Treatment Plan  Patient Details  Name: Denise Freeman MRN: [id number] Date of Birth: 1950/09/03 Referring Provider:   Flowsheet Row PULMONARY REHAB COPD ORIENTATION from 05/02/2016 in [facility]  Referring Provider  Dr. Halford Chessman      Initial Encounter Date:  Flowsheet Row PULMONARY REHAB COPD ORIENTATION from 05/02/2016 in [facility]  Date  05/02/16  Referring Provider  Dr. 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  albuterol (PROVENTIL) (2.5 MG/3ML) 0.083% nebulizer solution, Take 3 mLs (2.5 mg total) by nebulization every 6 (six) hours as needed for wheezing or shortness of breath., Disp: 1350 mL, Rfl: 3 .  aspirin 81 MG chewable tablet, Chew 81 mg by mouth daily., Disp: , Rfl:  .  atorvastatin (LIPITOR) 20 MG tablet, Take 1 tablet (20 mg total) by mouth daily., Disp: 90 tablet, Rfl: 3 .  budesonide (PULMICORT) 0.5 MG/2ML nebulizer solution, USE 2 ML (0.5 MG TOTAL) VIA NEBULIZER TWICE A DAY, Disp: 360 mL, Rfl: 3 .  cetirizine (ZYRTEC) 10 MG tablet, Take 10 mg by mouth at bedtime., Disp: , Rfl:  .  diltiazem (CARDIZEM) 30 MG tablet, Take 1 tablet (30 mg total) by mouth 2 (two) times daily., Disp: 180 tablet, Rfl: 3 .  docusate sodium (COLACE) 100 MG capsule, Take 100 mg by mouth daily., Disp: , Rfl:  .  ergocalciferol (VITAMIN D2) 50000 units capsule, Take 1 capsule by mouth every two weeks., Disp: 7 capsule, Rfl: 0 .  escitalopram (LEXAPRO) 10 MG tablet, Take 10 mg by mouth at bedtime. , Disp: , Rfl:  .  fenofibrate 54 MG tablet, Take 1 tablet (54 mg total) by mouth daily., Disp: 90 tablet, Rfl: 3 .  ferrous sulfate 324 (65 Fe) MG TBEC, Take 1 tablet by mouth daily., Disp: , Rfl:  .  fluticasone (FLONASE) 50 MCG/ACT nasal spray, Place 1 spray into both nostrils daily as needed for allergies or rhinitis., Disp: 32 g, Rfl: 1 .   furosemide (LASIX) 40 MG tablet, Take 2 tablets (80 mg total) by mouth 2 (two) times daily. (Patient taking differently: Take 60 mg by mouth 2 (two) times daily. ), Disp: 360 tablet, Rfl: 3 .  Insulin Glargine (TOUJEO SOLOSTAR) 300 UNIT/ML SOPN, Inject 40-50 Units into the skin 2 times daily at 12 noon and 4 pm. (Patient taking differently: Inject 40-50 Units into the skin See admin instructions. Inject 40 units subque in the morning and inject 50 units subque in the evening.), Disp: 18 pen, Rfl: 1 .  insulin lispro (HUMALOG KWIKPEN) 100 UNIT/ML KiwkPen, Inject 25-35 units under skin 3x a day (Patient taking differently: Inject 26-28 Units into the skin See admin instructions. Inject 26 units subque in the morning, 28 units subque before lunch and 28 units subque before dinner), Disp: 35 pen, Rfl: 1 .  Insulin Pen Needle 32G X 4 MM MISC, Use to inject insulin 4 times daily., Disp: 400 each, Rfl: 3 .  ipratropium-albuterol (DUONEB) 0.5-2.5 (3) MG/3ML SOLN, Take 3 mLs by nebulization 4 (four) times daily., Disp: 1080 mL, Rfl: 1 .  metFORMIN (GLUCOPHAGE-XR) 500 MG 24 hr tablet, Take 1 tablet (500 mg total) by mouth daily with breakfast. (Patient taking differently: Take 500 mg by mouth 2 (two) times daily. ), Disp: 180 tablet, Rfl: 3 .  metolazone (ZAROXOLYN) 2.5 MG tablet, Take 1 tablet (2.5 mg total) by mouth  daily., Disp: 90 tablet, Rfl: 3 .  mirtazapine (REMERON) 15 MG tablet, Take 15 mg by mouth at bedtime., Disp: , Rfl:  .  modafinil (PROVIGIL) 200 MG tablet, Take 1 tablet (200 mg total) by mouth daily., Disp: 90 tablet, Rfl: 0 .  montelukast (SINGULAIR) 10 MG tablet, Take 1 tablet (10 mg total) by mouth at bedtime., Disp: 90 tablet, Rfl: 3 .  mupirocin cream (BACTROBAN) 2 %, Apply 1 application topically 2 (two) times daily., Disp: 15 g, Rfl: 0 .  nitroGLYCERIN (NITROSTAT) 0.4 MG SL tablet, Place 1 tablet (0.4 mg total) under the tongue every 5 (five) minutes as needed for chest pain., Disp: 25  tablet, Rfl: 3 .  nystatin (MYCOSTATIN/NYSTOP) 100000 UNIT/GM POWD, Apply topically 2 (two) times daily. Apply under breasts, Disp: , Rfl:  .  ondansetron (ZOFRAN-ODT) 4 MG disintegrating tablet, Take 1 tablet (4 mg total) by mouth 2 (two) times daily as needed for nausea or vomiting., Disp: 30 tablet, Rfl: 1 .  oxyCODONE (OXY IR/ROXICODONE) 5 MG immediate release tablet, Take 5 mg by mouth 2 (two) times daily., Disp: , Rfl:  .  OXYGEN, Inhale 3 L into the lungs continuous. 3 liters 24/7, Disp: , Rfl:  .  pantoprazole (PROTONIX) 20 MG tablet, Take 1 tablet (20 mg total) by mouth 2 (two) times daily before a meal., Disp: 180 tablet, Rfl: 1 .  potassium chloride SA (K-DUR,KLOR-CON) 20 MEQ tablet, Take 1 tablet (20 mEq total) by mouth 3 (three) times daily., Disp: 270 tablet, Rfl: 0 .  PRESCRIPTION MEDICATION, Inhale into the lungs See admin instructions. Use BIPAP every time laying down, Disp: , Rfl:  .  primidone (MYSOLINE) 50 MG tablet, Take 1 tablet (50 mg total) by mouth at bedtime., Disp: 90 tablet, Rfl: 1 .  Probiotic Product (PROBIOTIC PO), Take 1 tablet by mouth at bedtime., Disp: , Rfl:  .  triamcinolone lotion (KENALOG) 0.1 %, Apply 1 application topically 3 (three) times daily. For up to 14 days and as needed, Disp: 60 mL, Rfl: 1 .  VITAMIN E PO, Take 1 tablet by mouth daily., Disp: , Rfl:  .  Vitamins A & D (VITAMIN A & D PO), Take by mouth., Disp: , Rfl:   Past Medical History: Past Medical History:  Diagnosis Date  . Anemia   . Anxiety   . Asthma   . Atrial fibrillation ([place])   . Bipolar 1 disorder ([place])   . 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 CHF (congestive heart failure) ([place])   . Chronic back pain   . Chronic bronchitis ([place])   . Colon polyps   . Complication of anesthesia    postop nausea/vomiting  . COPD (chronic obstructive pulmonary disease) ([place])   . Coronary artery disease   . Depression   . Family history of adverse reaction to anesthesia     Uncle was positive for malignant hyperthermia; patient had testing done and was negative.  . Fibromyalgia   . GERD (gastroesophageal reflux disease)   . Gout   . High cholesterol   . History of blood transfusion 06/2015   "bleeding from my rectum"  . History of hiatal hernia   . HOH (hard of hearing)   . Hx of colonic polyps 03/21/2016   3 small adenomas no recall - co-morbidities  . Myocardial infarction 1990   "mild"  . 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 On home oxygen therapy    "  3L; 24/7" (03/14/2016)  . OSA treated with BiPAP    uses biPAP, 10 (03/14/2016)  . Osteoarthritis   . Pneumonia   . Shortness of breath dyspnea   . Type II diabetes mellitus ([facility])     Tobacco Use: History  Smoking Status  . Former Smoker  . Packs/day: 2.00  . Years: 14.00  . Types: Cigarettes  . Quit date: 04/12/1989  Smokeless Tobacco  . Never Used    Labs: Recent Review Flowsheet Data    Labs for ITP Cardiac and Pulmonary Rehab Latest Ref Rng & Units 06/25/2015 11/28/2015 02/28/2016 03/14/2016 04/19/2016   Cholestrol 0 - 200 mg/dL - - - - 236(H)   LDLDIRECT mg/dL - - - - 153.0   HDL >39.00 mg/dL - - - - 37.70(L)   Trlycerides 0.0 - 149.0 mg/dL - - - - 258.0(H)   Hemoglobin A1c 4.8 - 5.6 % 6.6(H) 7.8 9.0 8.9(H) -      Capillary Blood Glucose: Lab Results  Component Value Date   GLUCAP 176 (H) 03/15/2016   GLUCAP 149 (H) 03/15/2016   GLUCAP 117 (H) 03/14/2016   GLUCAP 137 (H) 03/14/2016   GLUCAP 152 (H) 06/26/2015     ADL UCSD:     Pulmonary Assessment Scores    Row Name 05/02/16 1149         ADL UCSD   ADL Phase Entry     SOB Score total 62     Rest 2     Walk 11     Stairs 5     Bath 3     Dress 3       CAT Score   CAT Score 31       mMRC Score   mMRC Score 2        Pulmonary Function Assessment:     Pulmonary Function Assessment - 05/02/16 1120      Pulmonary Function Tests   FVC% 31 %   FEV1% 22 %   FEV1/FVC Ratio 70   RV% 1.88 %   DLCO% 19.55 %      Initial Spirometry Results   FVC% 35 %   FEV1% 30 %   FEV1/FVC Ratio 65   Comments Patient is lelgally blind and rides a skooter to get around.     Post Bronchodilator Spirometry Results   FVC% 35 %   FEV1% 30 %   FEV1/FVC Ratio 65     Breath   Bilateral Breath Sounds Decreased   Shortness of Breath Yes      Exercise Target Goals:    Exercise Program Goal: Individual exercise prescription set with THRR, safety & activity barriers. Participant demonstrates ability to understand and report RPE using BORG scale, to self-measure pulse accurately, and to acknowledge the importance of the exercise prescription.  Exercise Prescription Goal: Starting with aerobic activity 30 plus minutes a day, 3 days per week for initial exercise prescription. Provide home exercise prescription and guidelines that participant acknowledges understanding prior to discharge.  Activity Barriers & Risk Stratification:     Activity Barriers & Cardiac Risk Stratification - 05/02/16 1101      Activity Barriers & Cardiac Risk Stratification   Activity Barriers Shortness of Breath;History of Falls;Assistive Device;Other (comment)   Comments Legally Blind   Cardiac Risk Stratification High      6 Minute Walk:   Initial Exercise Prescription:     Initial Exercise Prescription - 05/02/16 1100      Date of Initial Exercise RX  and Referring Provider   Date 05/02/16   Referring Provider Dr. Halford Chessman     Oxygen   Oxygen Continuous   Liters 3     NuStep   Level 2   Watts 15   Minutes 15   METs 1.8     Arm Ergometer   Level 1.5   Watts 15   Minutes 20   METs 1.9     Prescription Details   Frequency (times per week) 3   Duration Progress to 30 minutes of continuous aerobic without signs/symptoms of physical distress     Intensity   THRR REST +  20   THRR 40-80% of Max Heartrate 113-127   Ratings of Perceived Exertion 11-13   Perceived Dyspnea 0-4     Progression   Progression  Continue progressive overload as per policy without signs/symptoms or physical distress.     Resistance Training   Training Prescription Yes   Weight 1   Reps 10-12      Perform Capillary Blood Glucose checks as needed.  Exercise Prescription Changes:   Exercise Comments:      Exercise Comments    Row Name 05/09/16 1536           Exercise Comments Patient has just started pulmonary rehab and will be proggressed appropriately           Discharge Exercise Prescription (Final Exercise Prescription Changes):   Nutrition:  Target Goals: Understanding of nutrition guidelines, daily intake of sodium <1573m, cholesterol <2077m calories 30% from fat and 7% or less from saturated fats, daily to have 5 or more servings of fruits and vegetables.  Biometrics:     Pre Biometrics - 05/02/16 1141      Pre Biometrics   Height '4\' 11"'  (1.499 m)   Weight 194 lb 3.6 oz (88.1 kg)   Waist Circumference 46 inches   Hip Circumference 43 inches   Waist to Hip Ratio 1.07 %   BMI (Calculated) 39.3   Triceps Skinfold 7 mm   % Body Fat 43.1 %   Grip Strength 57.67 kg   Flexibility 0 in   Single Leg Stand 5 seconds       Nutrition Therapy Plan and Nutrition Goals:   Nutrition Discharge: Rate Your Plate Scores:     Nutrition Assessments - 05/02/16 1232      Rate Your Plate Scores   Pre Score 51      Psychosocial: Target Goals: Acknowledge presence or absence of depression, maximize coping skills, provide positive support system. Participant is able to verbalize types and ability to use techniques and skills needed for reducing stress and depression.  Initial Review & Psychosocial Screening:     Initial Psych Review & Screening - 05/02/16 [id number]     Barriers   Psychosocial barriers to participate in program There are no identifiable barriers or psychosocial needs.     Screening Interventions   Interventions Encouraged  to exercise      Quality of Life Scores:     Quality of Life - 05/02/16 1143      Quality of Life Scores   Health/Function Pre 18.94 %   Socioeconomic Pre 19.92 %   Psych/Spiritual Pre 29.64 %   Family Pre 26.4 %   GLOBAL Pre 22.41 %      PHQ-9: Recent Review Flowsheet Data    [facility] 05/02/2016 04/19/2016   Decreased Interest  0 0   Down, Depressed, Hopeless 1 0   PHQ - 2 Score 1 0   Altered sleeping 1 -   Tired, decreased energy 1 -   Change in appetite 1 -   Feeling bad or failure about yourself  1 -   Trouble concentrating 0 -   Moving slowly or fidgety/restless 0 -   Suicidal thoughts 0 -   PHQ-9 Score 5 -   Difficult doing work/chores Somewhat difficult -      Psychosocial Evaluation and Intervention:     Psychosocial Evaluation - 05/02/16 1245      Psychosocial Evaluation & Interventions   Interventions Encouraged to exercise with the program and follow exercise prescription   Comments Patient has no psychosocial barriers identified.    Continued Psychosocial Services Needed No      Psychosocial Re-Evaluation:     Psychosocial Re-Evaluation    [place] Name 05/16/16 1306             Psychosocial Re-Evaluation   Interventions Encouraged to attend Pulmonary Rehabilitation for the exercise;Stress management education       Comments Patient does have Bipolar disorder. [name] QOL score was 22.41 and her PHQ-9 score was 6. She is on an anti-anxiety medication. She has had one emotional outburst during a session. The team was able to calm her down.        Continued Psychosocial Services Needed Yes          Education: Education Goals: Education classes will be provided on a weekly basis, covering required topics. Participant will state understanding/return demonstration of topics presented.  Learning Barriers/Preferences:     Learning Barriers/Preferences - 05/02/16 1102      Learning Barriers/Preferences   Learning Barriers None    Learning Preferences Written Material;Video;Group Instruction      Education Topics: How Lungs Work and Diseases: - Discuss the anatomy of the lungs and diseases that can affect the lungs, such as COPD.   Exercise: -Discuss the importance of exercise, FITT principles of exercise, normal and abnormal responses to exercise, and how to exercise safely.   Environmental Irritants: -Discuss types of environmental irritants and how to limit exposure to environmental irritants.   Meds/Inhalers and oxygen: - Discuss respiratory medications, definition of an inhaler and oxygen, and the proper way to use an inhaler and oxygen.   Energy Saving Techniques: - Discuss methods to conserve energy and decrease shortness of breath when performing activities of daily living.    Bronchial Hygiene / Breathing Techniques: - Discuss breathing mechanics, pursed-lip breathing technique,  proper posture, effective ways to clear airways, and other functional breathing techniques Flowsheet Row PULMONARY REHAB CHRONIC OBSTRUCTIVE PULMONARY DISEASE from 05/16/2016 in [facility]  Date  05/09/16  Educator  Nils Flack  Instruction Review Code  2- meets goals/outcomes      Cleaning Equipment: - Provides group verbal and written instruction about the health risks of elevated stress, cause of high stress, and healthy ways to reduce stress.   Nutrition I: Fats: - Discuss the types of cholesterol, what cholesterol does to the body, and how cholesterol levels can be controlled. Flowsheet Row PULMONARY REHAB CHRONIC OBSTRUCTIVE PULMONARY DISEASE from 05/16/2016 in [facility]  Date  05/16/16  Educator  Russella Dar  Instruction Review Code  2- meets goals/outcomes      Nutrition II: Labels: -Discuss the different components of food labels and how to read food labels.   Respiratory Infections: - Discuss the signs  and symptoms of respiratory infections, ways to  prevent respiratory infections, and the importance of seeking medical treatment when having a respiratory infection.   Stress I: Signs and Symptoms: - Discuss the causes of stress, how stress may lead to anxiety and depression, and ways to limit stress.   Stress II: Relaxation: -Discuss relaxation techniques to limit stress.   Oxygen for Home/Travel: - Discuss how to prepare for travel when on oxygen and proper ways to transport and store oxygen to ensure safety.   Knowledge Questionnaire Score:     Knowledge Questionnaire Score - 05/02/16 1104      Knowledge Questionnaire Score   Pre Score 6/14      Core Components/Risk Factors/Patient Goals at Admission:     Personal Goals and Risk Factors at Admission - 05/02/16 1241      Core Components/Risk Factors/Patient Goals on Admission    Weight Management Yes;Obesity   Intervention Weight Management: Develop a combined nutrition and exercise program designed to reach desired caloric intake, while maintaining appropriate intake of nutrient and fiber, sodium and fats, and appropriate energy expenditure required for the weight goal.   Increase Strength and Stamina Yes   Intervention Provide advice, education, support and counseling about physical activity/exercise needs.;Develop an individualized exercise prescription for aerobic and resistive training based on initial evaluation findings, risk stratification, comorbidities and participant's personal goals.   Expected Outcomes Achievement of increased cardiorespiratory fitness and enhanced flexibility, muscular endurance and strength shown through measurements of functional capacity and personal statement of participant.   Improve shortness of breath with ADL's Yes   Intervention Provide education, individualized exercise plan and daily activity instruction to help decrease symptoms of SOB with activities of daily living.   Expected Outcomes Short Term: Achieves a reduction of symptoms  when performing activities of daily living.   Develop more efficient breathing techniques such as purse lipped breathing and diaphragmatic breathing; and practicing self-pacing with activity Yes   Intervention Provide education, demonstration and support about specific breathing techniuqes utilized for more efficient breathing. Include techniques such as pursed lipped breathing, diaphragmatic breathing and self-pacing activity.   Expected Outcomes Short Term: Participant will be able to demonstrate and use breathing techniques as needed throughout daily activities.   Increase knowledge of respiratory medications and ability to use respiratory devices properly  Yes   Intervention Provide education and demonstration as needed of appropriate use of medications, inhalers, and oxygen therapy.   Expected Outcomes Short Term: Achieves understanding of medications use. Understands that oxygen is a medication prescribed by physician. Demonstrates appropriate use of inhaler and oxygen therapy.   Intervention Provide education about proper nutrition, including hydration, and aerobic/resistive exercise prescription along with prescribed medications to achieve blood glucose in normal ranges: Fasting glucose 65-99 mg/dL   Expected Outcomes Long Term: Attainment of HbA1C < 7%.   Personal Goal Other Yes   Personal Goal Get stronger and stay stronger. Do her regular ADL's without SOB.    Intervention Patient will exercise 2 days/week in pulmonary rehab and supplement at home with exercise 3 days/week.    Expected Outcomes Patient will meet her personal goals.       Core Components/Risk Factors/Patient Goals Review:      Goals and Risk Factor Review    Row Name 05/02/16 1244 05/16/16 1305           Core Components/Risk Factors/Patient Goals Review   Personal Goals Review Weight Management/Obesity;Increase Strength and Stamina;Improve shortness of breath with ADL's Weight Management/Obesity;Increase Strength  and Stamina;Improve shortness of breath with ADL's;Develop more efficient breathing techniques such as purse lipped breathing and diaphragmatic breathing and practicing self-pacing with activity.;Diabetes  Get stronger and stay stronger. Do her regular ADL's without SOB.       Review  - Patient new to program. She has had 5 sessions. Will continue to monitor for progress.       Expected Outcomes  - Patient will continue to attend sessions meeting her personal goals.          Core Components/Risk Factors/Patient Goals at Discharge (Final Review):      Goals and Risk Factor Review - 05/16/16 1305      Core Components/Risk Factors/Patient Goals Review   Personal Goals Review Weight Management/Obesity;Increase Strength and Stamina;Improve shortness of breath with ADL's;Develop more efficient breathing techniques such as purse lipped breathing and diaphragmatic breathing and practicing self-pacing with activity.;Diabetes  Get stronger and stay stronger. Do her regular ADL's without SOB.    Review Patient new to program. She has had 5 sessions. Will continue to monitor for progress.    Expected Outcomes Patient will continue to attend sessions meeting her personal goals.       ITP Comments:     ITP Comments    Row Name 05/09/16 1256           ITP Comments Patient met with Registered Dietitian to discuss nutrition topics including: Heart healthty eating, heart health cooking and make smart choices when shopping; Portion control; weight management; and hydration. Patient attended a group session with the hospital chaplian called [facility] to discuss and share how her recent pulmonary diagnosis has effected her life.          Comments: ITP REVIEW Pt is making expected progress toward pulmonary rehab goals after completing 5 sessions. Recommend continued exercise, life style modification, education, and utilization of breathing techniques to increase stamina and strength and decrease  shortness of breath with exertion.

## 2016-05-16 NOTE — Progress Notes (Signed)
Daily Session Note  Patient Details  Name: Denise Freeman MRN: [id number] Date of Birth: [birth year] Referring Provider:   Flowsheet Row PULMONARY REHAB COPD ORIENTATION from 05/02/2016 in [facility]  Referring Provider  Dr. Halford Chessman      Encounter Date: 05/16/2016  Check In:     Session Check In - 05/16/16 0900      Check-In   Location [facility]   Staff Present Aundra Dubin, RN, BSN;Ulrick Methot Luther Parody, BS, EP, Exercise Physiologist   Supervising physician immediately available to respond to emergencies See telemetry face sheet for immediately available MD   Medication changes reported     No   Fall or balance concerns reported    No   Warm-up and Cool-down Performed as group-led instruction   Resistance Training Performed Yes   VAD Patient? No     Pain Assessment   Currently in Pain? No/denies   Pain Score 0-No pain   Multiple Pain Sites No      Capillary Blood Glucose: No results found for this or any previous visit (from the past 24 hour(s)).   Goals Met:  Independence with exercise equipment Improved SOB with ADL's Using PLB without cueing & demonstrates good technique Exercise tolerated well No report of cardiac concerns or symptoms Strength training completed today  Goals Unmet:  Not Applicable  Comments: Check out 1000   Dr. Sinda Du is Medical Director for [facility].

## 2016-05-20 ENCOUNTER — Other Ambulatory Visit: Payer: Self-pay

## 2016-05-20 MED ORDER — METFORMIN HCL ER 500 MG PO TB24: 500.0000 mg | ORAL_TABLET | Freq: Two times a day (BID) | ORAL | 0 refills | Status: DC

## 2016-05-21 ENCOUNTER — Encounter (HOSPITAL_COMMUNITY)
Admission: RE | Admit: 2016-05-21 | Discharge: 2016-05-21 | Disposition: A | Discharge status: Home / Self Care | Payer: Medicare Other | Source: Ambulatory Visit | Attending: Pulmonary Disease | Admitting: Pulmonary Disease

## 2016-05-21 ENCOUNTER — Telehealth: Payer: Self-pay | Admitting: Internal Medicine

## 2016-05-21 DIAGNOSIS — J449 Chronic obstructive pulmonary disease, unspecified: Secondary | ICD-10-CM

## 2016-05-21 DIAGNOSIS — J42 Unspecified chronic bronchitis: Secondary | ICD-10-CM | POA: Diagnosis not present

## 2016-05-21 MED ORDER — METFORMIN HCL ER 500 MG PO TB24: 500.0000 mg | ORAL_TABLET | Freq: Two times a day (BID) | ORAL | 1 refills | Status: DC

## 2016-05-21 MED ORDER — INSULIN GLARGINE 300 UNIT/ML ~~LOC~~ SOPN: 40.0000 [IU] | PEN_INJECTOR | Freq: Two times a day (BID) | SUBCUTANEOUS | 1 refills | Status: DC

## 2016-05-21 NOTE — Telephone Encounter (Signed)
Refill submitted per patient's request.  

## 2016-05-21 NOTE — Telephone Encounter (Signed)
Refill of   metFORMIN (GLUCOPHAGE-XR) 500 MG 24 hr tablet Insulin Glargine (TOUJEO SOLOSTAR) 300 UNIT/ML SOPN And her pen tips fenofibrate 54 MG tablet    [facility] - [place], [place] - [address] [phone] (Phone) [phone] (Fax)

## 2016-05-21 NOTE — Progress Notes (Signed)
Daily Session Note  Patient Details  Name: Denise Freeman MRN: [id number] Date of Birth: 04-Dec-1950 Referring Provider:   Flowsheet Row PULMONARY REHAB COPD ORIENTATION from 05/02/2016 in [place]  Referring Provider  Dr. Halford Chessman      Encounter Date: 05/21/2016  Check In:     Session Check In - 05/21/16 1401      Check-In   Location [facility]   Staff Present Suzanne Boron, BS, EP, Exercise Physiologist   Supervising physician immediately available to respond to emergencies See telemetry face sheet for immediately available MD   Medication changes reported     No   Fall or balance concerns reported    No   Warm-up and Cool-down Performed as group-led instruction   Resistance Training Performed Yes   VAD Patient? No     Pain Assessment   Currently in Pain? No/denies   Pain Score 0-No pain   Multiple Pain Sites No      Capillary Blood Glucose: No results found for this or any previous visit (from the past 24 hour(s)).   Goals Met:  Independence with exercise equipment Improved SOB with ADL's Using PLB without cueing & demonstrates good technique Exercise tolerated well No report of cardiac concerns or symptoms Strength training completed today  Goals Unmet:  Not Applicable  Comments: Check out 230   Dr. Sinda Du is Medical Director for [facility] Pul[facility] Re[facility].

## 2016-05-23 ENCOUNTER — Encounter (HOSPITAL_COMMUNITY)
Admission: RE | Admit: 2016-05-23 | Discharge: 2016-05-23 | Disposition: A | Discharge status: Home / Self Care | Payer: Medicare Other | Source: Ambulatory Visit | Attending: Pulmonary Disease | Admitting: Pulmonary Disease

## 2016-05-23 DIAGNOSIS — J449 Chronic obstructive pulmonary disease, unspecified: Secondary | ICD-10-CM

## 2016-05-23 DIAGNOSIS — J42 Unspecified chronic bronchitis: Secondary | ICD-10-CM | POA: Diagnosis not present

## 2016-05-23 NOTE — Progress Notes (Signed)
Daily Session Note  Patient Details  Name: Denise Freeman MRN: [id number] Date of Birth: 31-Aug-1950 Referring Provider:   Flowsheet Row PULMONARY REHAB COPD ORIENTATION from 05/02/2016 in [place]  Referring Provider  Dr. Halford Chessman      Encounter Date: 05/23/2016  Check In:     Session Check In - 05/23/16 1411      Check-In   Location [facility]   Staff Present Aundra Dubin, RN, BSN;Kiyara Bouffard Luther Parody, BS, EP, Exercise Physiologist   Supervising physician immediately available to respond to emergencies See telemetry face sheet for immediately available MD   Medication changes reported     No   Fall or balance concerns reported    No   Warm-up and Cool-down Performed as group-led instruction   Resistance Training Performed Yes   VAD Patient? No     Pain Assessment   Currently in Pain? No/denies   Pain Score 0-No pain   Multiple Pain Sites No      Capillary Blood Glucose: No results found for this or any previous visit (from the past 24 hour(s)).   Goals Met:  Independence with exercise equipment Improved SOB with ADL's Using PLB without cueing & demonstrates good technique Exercise tolerated well No report of cardiac concerns or symptoms Strength training completed today  Goals Unmet:  Not Applicable  Comments: Check out 230   Dr. Sinda Du is Medical Director for [facility] Pul[facility] Re[facility].

## 2016-05-28 ENCOUNTER — Encounter (HOSPITAL_COMMUNITY)
Admission: RE | Admit: 2016-05-28 | Discharge: 2016-05-28 | Disposition: A | Discharge status: Home / Self Care | Payer: Medicare Other | Source: Ambulatory Visit | Attending: Pulmonary Disease | Admitting: Pulmonary Disease

## 2016-05-28 DIAGNOSIS — J42 Unspecified chronic bronchitis: Secondary | ICD-10-CM | POA: Diagnosis not present

## 2016-05-28 DIAGNOSIS — J449 Chronic obstructive pulmonary disease, unspecified: Secondary | ICD-10-CM

## 2016-05-28 NOTE — Progress Notes (Signed)
Daily Session Note  Patient Details  Name: Jeanell Mangan MRN: [id number] Date of Birth: 03-Dec-1950 Referring Provider:   Flowsheet Row PULMONARY REHAB COPD ORIENTATION from 05/02/2016 in [place]  Referring Provider  Dr. Halford Chessman      Encounter Date: 05/28/2016  Check In:     Session Check In - 05/28/16 1330      Check-In   Location [facility]   Staff Present Suzanne Boron, BS, EP, Exercise Physiologist;Debra Wynetta Emery, RN, BSN   Supervising physician immediately available to respond to emergencies See telemetry face sheet for immediately available MD   Medication changes reported     No   Fall or balance concerns reported    No   Warm-up and Cool-down Performed as group-led instruction   Resistance Training Performed Yes   VAD Patient? No     Pain Assessment   Currently in Pain? No/denies   Pain Score 0-No pain   Multiple Pain Sites No      Capillary Blood Glucose: No results found for this or any previous visit (from the past 24 hour(s)).   Goals Met:  Independence with exercise equipment Improved SOB with ADL's Using PLB without cueing & demonstrates good technique Exercise tolerated well No report of cardiac concerns or symptoms Strength training completed today  Goals Unmet:  Not Applicable  Comments: Check out 230   Dr. Sinda Du is Medical Director for [facility] Pulmonary Rehab.

## 2016-05-30 ENCOUNTER — Encounter (HOSPITAL_COMMUNITY): Admission: RE | Admit: 2016-05-30 | Payer: Medicare Other | Source: Ambulatory Visit

## 2016-06-04 ENCOUNTER — Encounter (HOSPITAL_COMMUNITY)
Admission: RE | Admit: 2016-06-04 | Discharge: 2016-06-04 | Disposition: A | Discharge status: Home / Self Care | Payer: Medicare Other | Source: Ambulatory Visit | Attending: Pulmonary Disease | Admitting: Pulmonary Disease

## 2016-06-04 ENCOUNTER — Telehealth: Payer: Self-pay | Admitting: Internal Medicine

## 2016-06-04 ENCOUNTER — Other Ambulatory Visit: Payer: Self-pay

## 2016-06-04 ENCOUNTER — Ambulatory Visit: Payer: Medicare Other | Admitting: Internal Medicine

## 2016-06-04 DIAGNOSIS — J449 Chronic obstructive pulmonary disease, unspecified: Secondary | ICD-10-CM | POA: Insufficient documentation

## 2016-06-04 MED ORDER — FENOFIBRATE 54 MG PO TABS: 54.0000 mg | ORAL_TABLET | Freq: Every day | ORAL | 3 refills | Status: DC

## 2016-06-04 MED ORDER — INSULIN PEN NEEDLE 32G X 4 MM MISC: 3 refills | Status: DC

## 2016-06-04 NOTE — Telephone Encounter (Signed)
Changed pharmacy to [facility], Ordered pen needles, and fenofibrate. Will order other medications when asked which ones patient needs sent in.

## 2016-06-04 NOTE — Progress Notes (Signed)
Daily Session Note  Patient Details  Name: Denise Freeman MRN: [id number] Date of Birth: 01-Oct-1950 Referring Provider:   Flowsheet Row PULMONARY REHAB COPD ORIENTATION from 05/02/2016 in [place]  Referring Provider  Dr. Halford Chessman      Encounter Date: 06/04/2016  Check In:     Session Check In - 06/04/16 1330      Check-In   Location [facility]   Staff Present Suzanne Boron, BS, EP, Exercise Physiologist;Diane Coad, MS, EP, [facility], Exercise Physiologist   Supervising physician immediately available to respond to emergencies See telemetry face sheet for immediately available MD   Medication changes reported     No   Fall or balance concerns reported    No   Warm-up and Cool-down Performed as group-led instruction   Resistance Training Performed Yes   VAD Patient? No     Pain Assessment   Currently in Pain? No/denies   Pain Score 0-No pain   Multiple Pain Sites No      Capillary Blood Glucose: No results found for this or any previous visit (from the past 24 hour(s)).   Goals Met:  Independence with exercise equipment Improved SOB with ADL's Using PLB without cueing & demonstrates good technique Exercise tolerated well No report of cardiac concerns or symptoms Strength training completed today  Goals Unmet:  Not Applicable  Comments: Check out 230   Dr. Sinda Du is Medical Director for [facility] Pulmonary Rehab.

## 2016-06-04 NOTE — Telephone Encounter (Signed)
Pt called in and said that her pharmacy has changed to the [facility] and they are requesting all new prescriptions be sent to them.  She did say she definitely needs the Pen Needles and the Fenofibrate be sent in to be filled now.

## 2016-06-05 ENCOUNTER — Telehealth: Payer: Self-pay | Admitting: Internal Medicine

## 2016-06-05 NOTE — Telephone Encounter (Signed)
Left message for patient to call back  

## 2016-06-06 ENCOUNTER — Encounter (HOSPITAL_COMMUNITY)
Admission: RE | Admit: 2016-06-06 | Discharge: 2016-06-06 | Disposition: A | Discharge status: Home / Self Care | Payer: Medicare Other | Source: Ambulatory Visit | Attending: Pulmonary Disease | Admitting: Pulmonary Disease

## 2016-06-06 ENCOUNTER — Telehealth: Payer: Self-pay | Admitting: Neurology

## 2016-06-06 DIAGNOSIS — J449 Chronic obstructive pulmonary disease, unspecified: Secondary | ICD-10-CM | POA: Diagnosis not present

## 2016-06-06 MED ORDER — PRIMIDONE 50 MG PO TABS: 50.0000 mg | ORAL_TABLET | Freq: Every day | ORAL | 1 refills | Status: DC

## 2016-06-06 NOTE — Telephone Encounter (Signed)
Left message for patient to call back I left a message for the patient to call back to schedule an office visit, or give me a call back to discuss further

## 2016-06-06 NOTE — Progress Notes (Signed)
Daily Session Note  Patient Details  Name: Kimiyo Carmicheal MRN: [id number] Date of Birth: 10-26-1950 Referring Provider:   Flowsheet Row PULMONARY REHAB COPD ORIENTATION from 05/02/2016 in [place]  Referring Provider  Dr. Halford Chessman      Encounter Date: 06/06/2016  Check In:     Session Check In - 06/06/16 1330      Check-In   Location [facility]   Staff Present Diane Angelina Pih, MS, EP, [facility], Exercise Physiologist;Osa Fogarty Wynetta Emery, RN, BSN   Supervising physician immediately available to respond to emergencies See telemetry face sheet for immediately available MD   Medication changes reported     No   Fall or balance concerns reported    No   Warm-up and Cool-down Performed as group-led instruction   Resistance Training Performed Yes   VAD Patient? No     Pain Assessment   Currently in Pain? No/denies   Pain Score 0-No pain   Multiple Pain Sites No      Capillary Blood Glucose: No results found for this or any previous visit (from the past 24 hour(s)).   Goals Met:  Independence with exercise equipment Exercise tolerated well No report of cardiac concerns or symptoms Strength training completed today  Goals Unmet:  Not Applicable  Comments: Check out 1430.   Dr. Sinda Du is Medical Director for [facility] Pulmonary Rehab.

## 2016-06-06 NOTE — Telephone Encounter (Signed)
Patient returning Sheri's call. States that the best time to call is after 3:30pm and can leave message on machine

## 2016-06-06 NOTE — Telephone Encounter (Deleted)
Denise Freeman 2050-07-05. She was needing to have a refill called in for her

## 2016-06-06 NOTE — Telephone Encounter (Signed)
RX sent to pharmacy.  LMOM making patient aware.

## 2016-06-06 NOTE — Telephone Encounter (Signed)
Last note states to follow up "PRN" but I believe this may just be for vision change.  Should patient continue follow up here and okay to send in Primidone?

## 2016-06-06 NOTE — Telephone Encounter (Signed)
You can refill x 6 months.  She can f/u here for that but her tremor has been stable and if PCP doesn't mind, probably easiest for patient in future if just gets it from her.

## 2016-06-06 NOTE — Telephone Encounter (Signed)
Denise Freeman Jul 14, 2050. She needs a refill called in for her to [facility] [place] [phone]). The ID # is [id number] Part D. The medication is Primidone. Her # is [phone]. She had an appointment for next week but had to reschedule due to a schedule conflict. She wanted to make sure she could still get the refill.  Thank you

## 2016-06-07 ENCOUNTER — Encounter: Payer: Self-pay | Admitting: Internal Medicine

## 2016-06-07 ENCOUNTER — Ambulatory Visit: Payer: Medicare Other | Admitting: Family Medicine

## 2016-06-07 ENCOUNTER — Ambulatory Visit (INDEPENDENT_AMBULATORY_CARE_PROVIDER_SITE_OTHER): Payer: Medicare Other | Admitting: Internal Medicine

## 2016-06-07 VITALS — BP 146/70 | HR 81 | Temp 98.3°F | Ht 59.0 in | Wt 189.8 lb

## 2016-06-07 DIAGNOSIS — K219 Gastro-esophageal reflux disease without esophagitis: Secondary | ICD-10-CM

## 2016-06-07 DIAGNOSIS — E559 Vitamin D deficiency, unspecified: Secondary | ICD-10-CM

## 2016-06-07 DIAGNOSIS — L304 Erythema intertrigo: Secondary | ICD-10-CM | POA: Diagnosis not present

## 2016-06-07 DIAGNOSIS — E785 Hyperlipidemia, unspecified: Secondary | ICD-10-CM

## 2016-06-07 DIAGNOSIS — R0981 Nasal congestion: Secondary | ICD-10-CM

## 2016-06-07 DIAGNOSIS — G8929 Other chronic pain: Secondary | ICD-10-CM | POA: Diagnosis not present

## 2016-06-07 DIAGNOSIS — R251 Tremor, unspecified: Secondary | ICD-10-CM | POA: Diagnosis not present

## 2016-06-07 DIAGNOSIS — M545 Low back pain: Secondary | ICD-10-CM | POA: Diagnosis not present

## 2016-06-07 DIAGNOSIS — E876 Hypokalemia: Secondary | ICD-10-CM | POA: Diagnosis not present

## 2016-06-07 LAB — BASIC METABOLIC PANEL WITH GFR
BUN: 33 mg/dL — AB (ref 7–25)
CALCIUM: 9.2 mg/dL (ref 8.6–10.4)
CO2: 31 mmol/L (ref 20–31)
CREATININE: 1.12 mg/dL — AB (ref 0.50–0.99)
Chloride: 94 mmol/L — ABNORMAL LOW (ref 98–110)
GFR, EST AFRICAN AMERICAN: 60 mL/min (ref >=60)
GFR, Est Non African American: 52 mL/min — ABNORMAL LOW (ref >=60)
GLUCOSE: 148 mg/dL — AB (ref 65–99)
POTASSIUM: 3.5 mmol/L (ref 3.5–5.3)
Sodium: 138 mmol/L (ref 135–146)

## 2016-06-07 NOTE — Progress Notes (Signed)
[name] Progress Note  Subjective:  Denise Freeman is a 66 y.o. is here to establish care. She previously received care through [place] at [facility]. She reports wanting to switch due to issues getting her prescriptions.   Concerns today include needing refills on medications: primidone for tremor (which Neurology has requested PCP provide), potassium chloride, ondansetron for occasional nausea, vitamin D, fluticasone, atorvastatin, nystatin powder, and needle pins. Wants referral to new pain clinic for chronic back pain.   PMH: T2DM - metformin 500 mg twice daily, humalog TID (22, 24, 28 units), toujeo 40-50 U BID COPD - takes Budesonide, Albuterol, Duonebs, Singulair  OSA - on 3L O2, on BiPap, participating in Cardiac Pulmonary rehab, on modafinil  HLD - on atorvastatin (previous PCP last recommended increasing to 40 mg since LDL was 153 in November) Hx GI bleed - hgb of 5.6 in January 2017, reports hgb got so low "peripheral vision gone"; had acute renal failure during this episode, unknown source but continues to have blood in stools; takes ferrous sulfate Chronic afib - was on xarelto but stopped after GI bleed, is supposed to be on coumadin but is not taking because thinks blood thinners gave her a stroke; follows with coumadin clinic through Cardiology  Chronic diastolic CHF - Lasix 80 mg twice daily, diltiazem 30 mg BID, and metolazone daily Hypokalemia - Takes kdur 20 mEq TID Vitamin D deficiency - improved s/p treatment with 50,000 U weekly Allergic rhinitis - zyrtec Bipolar disorder - lexapro, remeron Tremor - Primidone  Chronic back pain - says she has DDD and also has had back surgery for implanted device for bladder incontinence; says has done PT for years and has needed spinal shots Fibromyalgia   She sees the following specialists: Endocrinologist - Dr. Cruzita Lederer at [facility]  Pulmonologist -- Dr. Halford Chessman Cardiologist - Dr.  Debara Pickett Dermatologist - Dr. Renda Rolls Podiatrist - [facility] Mamm[facility] - [facility]  Ophthamalogy - Dr. Zadie Rhine  Gastroenterology - Dr. Carlean Purl at [facility]  Psychiatry - Dr. Bernita Raisin Neurology - Dr. Wells Guiles Tat   Past Medical History:  Diagnosis Date  . Anemia   . Anxiety   . Asthma   . Atrial fibrillation ([place])   . Bipolar 1 disorder ([place])   . 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 CHF (congestive heart failure) ([place])   . Chronic back pain   . Chronic bronchitis ([place])   . Colon polyps   . Complication of anesthesia    postop nausea/vomiting  . COPD (chronic obstructive pulmonary disease) ([place])   . Coronary artery disease   . Depression   . Family history of adverse reaction to anesthesia    Uncle was positive for malignant hyperthermia; patient had testing done and was negative.  . Fibromyalgia   . GERD (gastroesophageal reflux disease)   . Gout   . High cholesterol   . History of blood transfusion 06/2015   "bleeding from my rectum"  . History of hiatal hernia   . HOH (hard of hearing)   . Hx of colonic polyps 03/21/2016   3 small adenomas no recall - co-morbidities  . Myocardial infarction 1990   "mild"  . 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 On home oxygen therapy    "3L; 24/7" (03/14/2016)  . OSA treated with BiPAP    uses biPAP, 10 (03/14/2016)  . Osteoarthritis   . Pneumonia   . Shortness of breath dyspnea   .  Type II diabetes mellitus ([place])    Past Surgical History:  Procedure Laterality Date  . APPENDECTOMY     "they busted"  . bladder stimulator     pt states, "it cannot be turned off; it's in my right hip; dead battery so it's not working anymore". (03/14/2016)  . BLADDER SUSPENSION     2003, 2006 and 2010  . CATARACT EXTRACTION W/PHACO Right 11/29/2014   Procedure: CATARACT EXTRACTION PHACO AND INTRAOCULAR LENS PLACEMENT (IOC);  Surgeon: Rutherford Guys, MD;  Location: [facility];  Service: Ophthalmology;  Laterality: Right;   CDE:3.81  . CATARACT EXTRACTION W/PHACO Left 12/13/2014   Procedure: CATARACT EXTRACTION PHACO AND INTRAOCULAR LENS PLACEMENT (IOC);  Surgeon: Rutherford Guys, MD;  Location: [facility];  Service: Ophthalmology;  Laterality: Left;  CDE:6.59  . CERVICAL DISC SURGERY N/A 2009   4, 6, and 7 cervical disc replaced  . COLONOSCOPY WITH PROPOFOL N/A 03/15/2016   Procedure: COLONOSCOPY WITH PROPOFOL;  Surgeon: Gatha Mayer, MD;  Location: [place];  Service: Endoscopy;  Laterality: N/A;  . ESOPHAGOGASTRODUODENOSCOPY (EGD) WITH PROPOFOL N/A 03/15/2016   Procedure: ESOPHAGOGASTRODUODENOSCOPY (EGD) WITH PROPOFOL;  Surgeon: Gatha Mayer, MD;  Location: [place];  Service: Endoscopy;  Laterality: N/A;  . HEEL SPUR SURGERY Bilateral   . HERNIA REPAIR    . I&D EXTREMITY Right 06/13/2015   Procedure: MINOR IRRIGATION AND DEBRIDEMENT EXTREMITY REMOVAL OF NAIL;  Surgeon: Daryll Brod, MD;  Location: [place];  Service: Orthopedics;  Laterality: Right;  . TUBAL LIGATION    . UMBILICAL HERNIA REPAIR     w/mesh    Social History   Social History  . Marital status: Married    Spouse name: N/A  . Number of children: 1  . Years of education: N/A   Occupational History  . house wife    Social History Main Topics  . Smoking status: Former Smoker    Packs/day: 2.00    Years: 14.00    Types: Cigarettes    Quit date: 04/12/1989  . Smokeless tobacco: Never Used  . Alcohol use No  . Drug use: No  . Sexual activity: No   Other Topics Concern  . Not on file   Social History Narrative   Married   Disabled   1 child; 52 yrs   6 pregnancies   1 child       Allergies  Allergen Reactions  . Xarelto [Rivaroxaban] Other (See Comments)    Internal bleeding  . Ancef [Cefazolin] Nausea And Vomiting  . Levaquin [Levofloxacin In D5w] Other (See Comments)    "afib"  . Lyrica [Pregabalin] Hives  . Tamiflu [Oseltamivir Phosphate] Other (See Comments)    "water blisters"  . Zoloft  [Sertraline Hcl] Other (See Comments)    Jaw problems, jittery  . Augmentin [Amoxicillin-Pot Clavulanate] Itching  . Ciprofloxacin Itching  . Haldol [Haloperidol] Other (See Comments)    Restless leg  . Nsaids Diarrhea  . Penicillins Itching, Nausea And Vomiting and Rash    Has patient had a PCN reaction causing immediate rash, facial/tongue/throat swelling, SOB or lightheadedness with hypotension: Yes Has patient had a PCN reaction causing severe rash involving mucus membranes or skin necrosis: No Has patient had a PCN reaction that required hospitalization No Has patient had a PCN reaction occurring within the last 10 years: Yes If all of the above answers are "NO", then may proceed with Cephalosporin use.  . Topamax [Topiramate] Nausea Only   Social:  Lives with husband.  Has a daughter who is 42-y/o. Used to smoke and quit 27 years ago. Doesn't drink. Doesn't do drugs. [employer].   Objective: Blood pressure (!) 146/70, pulse 81, temperature 98.3 F (36.8 C), temperature source Oral, height 4\' 11"  (1.499 m), weight 189 lb 12.8 oz (86.1 kg), SpO2 97 %. Body mass index is 38.33 kg/m. Constitutional: Obese female, in NAD HENT: Edentulous. Converse in place. TMs clear after cerumen removed.  Cardiovascular: Irregularly irregular, no m/r/g.  Pulmonary/Chest: Effort normal and breath sounds normal. No respiratory distress.  Abdominal: Soft. +BS, NT, ND, no rebound or guarding.  Musculoskeletal: No LE edema.  Neurological: AOx3 Skin: Skin is warm and dry. No rash noted. No erythema.  Psychiatric: Pressured speech.  Vitals reviewed  Assessment/Plan: Will check BMP due to history of hypokalemia.  Will order needle pins, primidone, potassium chloride, zofran, vit D, fluticasone, atorvastatin (40 mg), and nystatin through mail order pharmacy as requested. Other medications provided by specialists, per patient.   Will place Pain Clinic referral. May benefit most from [place] given  hardware.  Encouraged patient to discuss restarting coumadin with her Cardiologist.  Follow-up in 1-2 months.  [name], MD [place], PGY-2

## 2016-06-07 NOTE — Patient Instructions (Signed)
Mrs. Mercier,  It was nice to meet you today.  I will call you about your potassium next week.  You will hear about the pain specialist referral next week.  Best, Dr. Ola Spurr

## 2016-06-09 MED ORDER — PRIMIDONE 50 MG PO TABS: 50.0000 mg | ORAL_TABLET | Freq: Every day | ORAL | 1 refills | Status: DC

## 2016-06-09 MED ORDER — ATORVASTATIN CALCIUM 40 MG PO TABS: 40.0000 mg | ORAL_TABLET | Freq: Every day | ORAL | 2 refills | Status: DC

## 2016-06-09 MED ORDER — NYSTATIN 100000 UNIT/GM EX POWD: Freq: Two times a day (BID) | CUTANEOUS | 3 refills | Status: DC

## 2016-06-09 MED ORDER — ONDANSETRON 4 MG PO TBDP: 4.0000 mg | ORAL_TABLET | Freq: Two times a day (BID) | ORAL | 1 refills | Status: DC | PRN

## 2016-06-09 MED ORDER — FLUTICASONE PROPIONATE 50 MCG/ACT NA SUSP: 1.0000 | Freq: Every day | NASAL | 6 refills | Status: DC | PRN

## 2016-06-09 MED ORDER — POTASSIUM CHLORIDE CRYS ER 20 MEQ PO TBCR: 20.0000 meq | EXTENDED_RELEASE_TABLET | Freq: Three times a day (TID) | ORAL | 1 refills | Status: DC

## 2016-06-09 MED ORDER — VITAMIN D3 10 MCG (400 UNIT) PO CAPS: 800.0000 [IU] | ORAL_CAPSULE | Freq: Every day | ORAL | 2 refills | Status: DC

## 2016-06-10 ENCOUNTER — Telehealth: Payer: Self-pay | Admitting: Pulmonary Disease

## 2016-06-10 MED ORDER — MONTELUKAST SODIUM 10 MG PO TABS: 10.0000 mg | ORAL_TABLET | Freq: Every day | ORAL | 1 refills | Status: DC

## 2016-06-10 NOTE — Telephone Encounter (Signed)
Spoke with pt. She is needing a refill on Singulair. Rx has been sent in. Nothing further was needed.

## 2016-06-11 ENCOUNTER — Encounter (HOSPITAL_COMMUNITY)
Admission: RE | Admit: 2016-06-11 | Discharge: 2016-06-11 | Disposition: A | Discharge status: Home / Self Care | Payer: Medicare Other | Source: Ambulatory Visit | Attending: Pulmonary Disease | Admitting: Pulmonary Disease

## 2016-06-11 ENCOUNTER — Ambulatory Visit: Payer: Medicare Other | Admitting: Neurology

## 2016-06-11 DIAGNOSIS — J449 Chronic obstructive pulmonary disease, unspecified: Secondary | ICD-10-CM

## 2016-06-11 NOTE — Progress Notes (Signed)
Daily Session Note  Patient Details  Name: Biana Haggar MRN: [id number] Date of Birth: [birth year] Referring Provider:   Flowsheet Row PULMONARY REHAB COPD ORIENTATION from 05/02/2016 in [place]  Referring Provider  Dr. Halford Chessman      Encounter Date: 06/11/2016  Check In:     Session Check In - 06/11/16 1330      Check-In   Location [facility]   Staff Present Suzanne Boron, BS, EP, Exercise Physiologist;Diane Coad, MS, EP, [facility], Exercise Physiologist   Supervising physician immediately available to respond to emergencies See telemetry face sheet for immediately available MD   Medication changes reported     No   Fall or balance concerns reported    No   Warm-up and Cool-down Performed as group-led instruction   Resistance Training Performed Yes   VAD Patient? No     Pain Assessment   Currently in Pain? No/denies   Pain Score 0-No pain   Multiple Pain Sites No      Capillary Blood Glucose: No results found for this or any previous visit (from the past 24 hour(s)).      Exercise Prescription Changes - 06/10/16 1400      Exercise Review   Progression Yes     Response to Exercise   Blood Pressure (Admit) 130/60   Blood Pressure (Exercise) 170/60   Blood Pressure (Exit) 122/52   Heart Rate (Admit) 83 bpm   Heart Rate (Exercise) 83 bpm   Heart Rate (Exit) 52 bpm   Oxygen Saturation (Admit) 98 %   Oxygen Saturation (Exercise) 92 %   Oxygen Saturation (Exit) 98 %   Rating of Perceived Exertion (Exercise) 10   Perceived Dyspnea (Exercise) 10   Duration Progress to 30 minutes of continuous aerobic without signs/symptoms of physical distress   Intensity Rest + 20     Progression   Progression Continue progressive overload as per policy without signs/symptoms or physical distress.     Resistance Training   Training Prescription Yes   Weight 2   Reps 10-12     Oxygen   Oxygen Continuous   Liters 3     NuStep   Level 2   Watts 16   Minutes 20   METs 3.59     Arm Ergometer   Level 2.3   Watts 29   Minutes 20   METs 2.1     Home Exercise Plan   Plans to continue exercise at Home   Frequency Add 2 additional days to program exercise sessions.     Goals Met:  Independence with exercise equipment Improved SOB with ADL's Using PLB without cueing & demonstrates good technique Exercise tolerated well No report of cardiac concerns or symptoms Strength training completed today  Goals Unmet:  Not Applicable  Comments: Check out 230   Dr. Sinda Du is Medical Director for [facility] Pulmonary Rehab.

## 2016-06-12 ENCOUNTER — Other Ambulatory Visit: Payer: Self-pay

## 2016-06-12 DIAGNOSIS — I480 Paroxysmal atrial fibrillation: Secondary | ICD-10-CM

## 2016-06-12 MED ORDER — DILTIAZEM HCL 30 MG PO TABS: 30.0000 mg | ORAL_TABLET | Freq: Two times a day (BID) | ORAL | 3 refills | Status: DC

## 2016-06-12 NOTE — Progress Notes (Signed)
Pulmonary Individual Treatment Plan  Patient Details  Name: Denise Freeman MRN: [id number] Date of Birth: Sep 27, 1950 Referring Provider:   Flowsheet Row PULMONARY REHAB COPD ORIENTATION from 05/02/2016 in [facility]  Referring Provider  Dr. Halford Chessman      Initial Encounter Date:  Flowsheet Row PULMONARY REHAB COPD ORIENTATION from 05/02/2016 in [facility]  Date  05/02/16  Referring Provider  Dr. 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  albuterol (PROVENTIL) (2.5 MG/3ML) 0.083% nebulizer solution, Take 3 mLs (2.5 mg total) by nebulization every 6 (six) hours as needed for wheezing or shortness of breath., Disp: 1350 mL, Rfl: 3 .  aspirin 81 MG chewable tablet, Chew 81 mg by mouth daily., Disp: , Rfl:  .  atorvastatin (LIPITOR) 40 MG tablet, Take 1 tablet (40 mg total) by mouth daily., Disp: 90 tablet, Rfl: 2 .  budesonide (PULMICORT) 0.5 MG/2ML nebulizer solution, USE 2 ML (0.5 MG TOTAL) VIA NEBULIZER TWICE A DAY, Disp: 360 mL, Rfl: 3 .  cetirizine (ZYRTEC) 10 MG tablet, Take 10 mg by mouth at bedtime., Disp: , Rfl:  .  Cholecalciferol (VITAMIN D3) 400 units CAPS, Take 800 Units by mouth daily., Disp: 180 capsule, Rfl: 2 .  diltiazem (CARDIZEM) 30 MG tablet, Take 1 tablet (30 mg total) by mouth 2 (two) times daily., Disp: 180 tablet, Rfl: 3 .  docusate sodium (COLACE) 100 MG capsule, Take 100 mg by mouth daily., Disp: , Rfl:  .  escitalopram (LEXAPRO) 10 MG tablet, Take 10 mg by mouth at bedtime. , Disp: , Rfl:  .  fenofibrate 54 MG tablet, Take 1 tablet (54 mg total) by mouth daily., Disp: 90 tablet, Rfl: 3 .  ferrous sulfate 324 (65 Fe) MG TBEC, Take 1 tablet by mouth daily., Disp: , Rfl:  .  fluticasone (FLONASE) 50 MCG/ACT nasal spray, Place 1 spray into both nostrils daily as needed for allergies or rhinitis., Disp: 32 g, Rfl: 6 .  furosemide  (LASIX) 40 MG tablet, Take 2 tablets (80 mg total) by mouth 2 (two) times daily. (Patient taking differently: Take 60 mg by mouth 2 (two) times daily. ), Disp: 360 tablet, Rfl: 3 .  Insulin Glargine (TOUJEO SOLOSTAR) 300 UNIT/ML SOPN, Inject 40-50 Units into the skin 2 times daily at 12 noon and 4 pm., Disp: 30 pen, Rfl: 1 .  insulin lispro (HUMALOG KWIKPEN) 100 UNIT/ML KiwkPen, Inject 25-35 units under skin 3x a day (Patient taking differently: Inject 26-28 Units into the skin See admin instructions. Inject 26 units subque in the morning, 28 units subque before lunch and 28 units subque before dinner), Disp: 35 pen, Rfl: 1 .  Insulin Pen Needle 32G X 4 MM MISC, Use to inject insulin 4 times daily., Disp: 400 each, Rfl: 3 .  ipratropium-albuterol (DUONEB) 0.5-2.5 (3) MG/3ML SOLN, Take 3 mLs by nebulization 4 (four) times daily., Disp: 1080 mL, Rfl: 1 .  metFORMIN (GLUCOPHAGE-XR) 500 MG 24 hr tablet, Take 1 tablet (500 mg total) by mouth 2 (two) times daily., Disp: 180 tablet, Rfl: 1 .  metolazone (ZAROXOLYN) 2.5 MG tablet, Take 1 tablet (2.5 mg total) by mouth daily., Disp: 90 tablet, Rfl: 3 .  mirtazapine (REMERON) 15 MG tablet, Take 15 mg by mouth at bedtime., Disp: , Rfl:  .  modafinil (PROVIGIL) 200 MG tablet, Take 1 tablet (200 mg total) by mouth daily., Disp:  90 tablet, Rfl: 0 .  montelukast (SINGULAIR) 10 MG tablet, Take 1 tablet (10 mg total) by mouth at bedtime., Disp: 90 tablet, Rfl: 1 .  mupirocin cream (BACTROBAN) 2 %, Apply 1 application topically 2 (two) times daily., Disp: 15 g, Rfl: 0 .  nitroGLYCERIN (NITROSTAT) 0.4 MG SL tablet, Place 1 tablet (0.4 mg total) under the tongue every 5 (five) minutes as needed for chest pain., Disp: 25 tablet, Rfl: 3 .  nystatin (NYSTATIN) powder, Apply topically 2 (two) times daily. Apply under breasts, Disp: 15 g, Rfl: 3 .  ondansetron (ZOFRAN-ODT) 4 MG disintegrating tablet, Take 1 tablet (4 mg total) by mouth 2 (two) times daily as needed for nausea  or vomiting., Disp: 30 tablet, Rfl: 1 .  oxyCODONE (OXY IR/ROXICODONE) 5 MG immediate release tablet, Take 5 mg by mouth 2 (two) times daily., Disp: , Rfl:  .  OXYGEN, Inhale 3 L into the lungs continuous. 3 liters 24/7, Disp: , Rfl:  .  pantoprazole (PROTONIX) 20 MG tablet, Take 1 tablet (20 mg total) by mouth 2 (two) times daily before a meal., Disp: 180 tablet, Rfl: 1 .  potassium chloride SA (K-DUR,KLOR-CON) 20 MEQ tablet, Take 1 tablet (20 mEq total) by mouth 3 (three) times daily., Disp: 270 tablet, Rfl: 1 .  PRESCRIPTION MEDICATION, Inhale into the lungs See admin instructions. Use BIPAP every time laying down, Disp: , Rfl:  .  primidone (MYSOLINE) 50 MG tablet, Take 1 tablet (50 mg total) by mouth at bedtime., Disp: 90 tablet, Rfl: 1 .  Probiotic Product (PROBIOTIC PO), Take 1 tablet by mouth at bedtime., Disp: , Rfl:  .  triamcinolone lotion (KENALOG) 0.1 %, Apply 1 application topically 3 (three) times daily. For up to 14 days and as needed, Disp: 60 mL, Rfl: 1 .  VITAMIN E PO, Take 1 tablet by mouth daily., Disp: , Rfl:  .  Vitamins A & D (VITAMIN A & D PO), Take by mouth., Disp: , Rfl:   Past Medical History: Past Medical History:  Diagnosis Date  . Anemia   . Anxiety   . Asthma   . Atrial fibrillation ([place])   . Bipolar 1 disorder ([place])   . 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 CHF (congestive heart failure) ([place])   . Chronic back pain   . Chronic bronchitis ([place])   . Colon polyps   . Complication of anesthesia    postop nausea/vomiting  . COPD (chronic obstructive pulmonary disease) ([place])   . Coronary artery disease   . Depression   . Family history of adverse reaction to anesthesia    Uncle was positive for malignant hyperthermia; patient had testing done and was negative.  . Fibromyalgia   . GERD (gastroesophageal reflux disease)   . Gout   . High cholesterol   . History of blood transfusion 06/2015   "bleeding from my rectum"  . History of hiatal  hernia   . HOH (hard of hearing)   . Hx of colonic polyps 03/21/2016   3 small adenomas no recall - co-morbidities  . Myocardial infarction 1990   "mild"  . 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 On home oxygen therapy    "3L; 24/7" (03/14/2016)  . OSA treated with BiPAP    uses biPAP, 10 (03/14/2016)  . Osteoarthritis   . Pneumonia   . Shortness of breath dyspnea   . Type II diabetes mellitus ([place])  Tobacco Use: History  Smoking Status  . Former Smoker  . Packs/day: 2.00  . Years: 14.00  . Types: Cigarettes  . Quit date: 04/12/1989  Smokeless Tobacco  . Never Used    Labs: Recent Review Flowsheet Data    Labs for ITP Cardiac and Pulmonary Rehab Latest Ref Rng & Units 06/25/2015 11/28/2015 02/28/2016 03/14/2016 04/19/2016   Cholestrol 0 - 200 mg/dL - - - - 236(H)   LDLDIRECT mg/dL - - - - 153.0   HDL >39.00 mg/dL - - - - 37.70(L)   Trlycerides 0.0 - 149.0 mg/dL - - - - 258.0(H)   Hemoglobin A1c 4.8 - 5.6 % 6.6(H) 7.8 9.0 8.9(H) -      Capillary Blood Glucose: Lab Results  Component Value Date   GLUCAP 176 (H) 03/15/2016   GLUCAP 149 (H) 03/15/2016   GLUCAP 117 (H) 03/14/2016   GLUCAP 137 (H) 03/14/2016   GLUCAP 152 (H) 06/26/2015     ADL UCSD:     Pulmonary Assessment Scores    Row Name 05/02/16 1149         ADL UCSD   ADL Phase Entry     SOB Score total 62     Rest 2     Walk 11     Stairs 5     Bath 3     Dress 3       CAT Score   CAT Score 31       mMRC Score   mMRC Score 2        Pulmonary Function Assessment:     Pulmonary Function Assessment - 05/02/16 1120      Pulmonary Function Tests   FVC% 31 %   FEV1% 22 %   FEV1/FVC Ratio 70   RV% 1.88 %   DLCO% 19.55 %     Initial Spirometry Results   FVC% 35 %   FEV1% 30 %   FEV1/FVC Ratio 65   Comments Patient is lelgally blind and rides a skooter to get around.     Post Bronchodilator Spirometry Results   FVC% 35 %   FEV1% 30 %   FEV1/FVC Ratio 65     Breath   Bilateral  Breath Sounds Decreased   Shortness of Breath Yes      Exercise Target Goals:    Exercise Program Goal: Individual exercise prescription set with THRR, safety & activity barriers. Participant demonstrates ability to understand and report RPE using BORG scale, to self-measure pulse accurately, and to acknowledge the importance of the exercise prescription.  Exercise Prescription Goal: Starting with aerobic activity 30 plus minutes a day, 3 days per week for initial exercise prescription. Provide home exercise prescription and guidelines that participant acknowledges understanding prior to discharge.  Activity Barriers & Risk Stratification:     Activity Barriers & Cardiac Risk Stratification - 05/02/16 1101      Activity Barriers & Cardiac Risk Stratification   Activity Barriers Shortness of Breath;History of Falls;Assistive Device;Other (comment)   Comments Legally Blind   Cardiac Risk Stratification High      6 Minute Walk:   Initial Exercise Prescription:     Initial Exercise Prescription - 05/02/16 1100      Date of Initial Exercise RX and Referring Provider   Date 05/02/16   Referring Provider Dr. Halford Chessman     Oxygen   Oxygen Continuous   Liters 3     NuStep   Level 2   Watts 15   Minutes  15   METs 1.8     Arm Ergometer   Level 1.5   Watts 15   Minutes 20   METs 1.9     Prescription Details   Frequency (times per week) 3   Duration Progress to 30 minutes of continuous aerobic without signs/symptoms of physical distress     Intensity   THRR REST +  20   THRR 40-80% of Max Heartrate 113-127   Ratings of Perceived Exertion 11-13   Perceived Dyspnea 0-4     Progression   Progression Continue progressive overload as per policy without signs/symptoms or physical distress.     Resistance Training   Training Prescription Yes   Weight 1   Reps 10-12      Perform Capillary Blood Glucose checks as needed.  Exercise Prescription Changes:       Exercise Prescription Changes    Row Name 06/10/16 1400             Exercise Review   Progression Yes         Response to Exercise   Blood Pressure (Admit) 130/60       Blood Pressure (Exercise) 170/60       Blood Pressure (Exit) 122/52       Heart Rate (Admit) 83 bpm       Heart Rate (Exercise) 83 bpm       Heart Rate (Exit) 52 bpm       Oxygen Saturation (Admit) 98 %       Oxygen Saturation (Exercise) 92 %       Oxygen Saturation (Exit) 98 %       Rating of Perceived Exertion (Exercise) 10       Perceived Dyspnea (Exercise) 10       Duration Progress to 30 minutes of continuous aerobic without signs/symptoms of physical distress       Intensity Rest + 20         Progression   Progression Continue progressive overload as per policy without signs/symptoms or physical distress.         Resistance Training   Training Prescription Yes       Weight 2       Reps 10-12         Oxygen   Oxygen Continuous       Liters 3         NuStep   Level 2       Watts 16       Minutes 20       METs 3.59         Arm Ergometer   Level 2.3       Watts 29       Minutes 20       METs 2.1         Home Exercise Plan   Plans to continue exercise at Home       Frequency Add 2 additional days to program exercise sessions.          Exercise Comments:      Exercise Comments    Row Name 05/09/16 1536 06/10/16 1404         Exercise Comments Patient has just started pulmonary rehab and will be proggressed appropriately  Patient is progressing well.         Discharge Exercise Prescription (Final Exercise Prescription Changes):     Exercise Prescription Changes - 06/10/16 1400      Exercise Review  Progression Yes     Response to Exercise   Blood Pressure (Admit) 130/60   Blood Pressure (Exercise) 170/60   Blood Pressure (Exit) 122/52   Heart Rate (Admit) 83 bpm   Heart Rate (Exercise) 83 bpm   Heart Rate (Exit) 52 bpm   Oxygen Saturation (Admit) 98 %   Oxygen  Saturation (Exercise) 92 %   Oxygen Saturation (Exit) 98 %   Rating of Perceived Exertion (Exercise) 10   Perceived Dyspnea (Exercise) 10   Duration Progress to 30 minutes of continuous aerobic without signs/symptoms of physical distress   Intensity Rest + 20     Progression   Progression Continue progressive overload as per policy without signs/symptoms or physical distress.     Resistance Training   Training Prescription Yes   Weight 2   Reps 10-12     Oxygen   Oxygen Continuous   Liters 3     NuStep   Level 2   Watts 16   Minutes 20   METs 3.59     Arm Ergometer   Level 2.3   Watts 29   Minutes 20   METs 2.1     Home Exercise Plan   Plans to continue exercise at Home   Frequency Add 2 additional days to program exercise sessions.      Nutrition:  Target Goals: Understanding of nutrition guidelines, daily intake of sodium <1562m, cholesterol <209m calories 30% from fat and 7% or less from saturated fats, daily to have 5 or more servings of fruits and vegetables.  Biometrics:     Pre Biometrics - 05/02/16 1141      Pre Biometrics   Height '4\' 11"'  (1.499 m)   Weight 194 lb 3.6 oz (88.1 kg)   Waist Circumference 46 inches   Hip Circumference 43 inches   Waist to Hip Ratio 1.07 %   BMI (Calculated) 39.3   Triceps Skinfold 7 mm   % Body Fat 43.1 %   Grip Strength 57.67 kg   Flexibility 0 in   Single Leg Stand 5 seconds       Nutrition Therapy Plan and Nutrition Goals:   Nutrition Discharge: Rate Your Plate Scores:     Nutrition Assessments - 05/02/16 1232      Rate Your Plate Scores   Pre Score 51      Psychosocial: Target Goals: Acknowledge presence or absence of depression, maximize coping skills, provide positive support system. Participant is able to verbalize types and ability to use techniques and skills needed for reducing stress and depression.  Initial Review & Psychosocial Screening:     Initial Psych Review & Screening -  05[place]02/16 [email or web address]     Barriers   Psychosocial barriers to participate in program There are no identifiable barriers or psychosocial needs.     Screening Interventions   Interventions Encouraged to exercise      Quality of Life Scores:     Quality of Life - 05/02/16 1143      Quality of Life Scores   Health/Function Pre 18.94 %   Socioeconomic Pre 19.92 %   Psych/Spiritual Pre 29.64 %   Family Pre 26.4 %   GLOBAL Pre 22.41 %      PHQ-9: Recent Review Flowsheet Data    [facility] 06/07/2016 05/02/2016 04/19/2016   Decreased Interest 0 0 0   Down, Depressed, Hopeless 0 1 0  PHQ - 2 Score 0 1 0   Altered sleeping - 1 -   Tired, decreased energy - 1 -   Change in appetite - 1 -   Feeling bad or failure about yourself  - 1 -   Trouble concentrating - 0 -   Moving slowly or fidgety/restless - 0 -   Suicidal thoughts - 0 -   PHQ-9 Score - 5 -   Difficult doing work/chores - Somewhat difficult -      Psychosocial Evaluation and Intervention:     Psychosocial Evaluation - 05/02/16 1245      Psychosocial Evaluation & Interventions   Interventions Encouraged to exercise with the program and follow exercise prescription   Comments Patient has no psychosocial barriers identified.    Continued Psychosocial Services Needed No      Psychosocial Re-Evaluation:     Psychosocial Re-Evaluation    [place] Name 05/16/16 1306             Psychosocial Re-Evaluation   Interventions Encouraged to attend Pulmonary Rehabilitation for the exercise;Stress management education       Comments Patient does have Bipolar disorder. [name] QOL score was 22.41 and her PHQ-9 score was 6. She is on an anti-anxiety medication. She has had one emotional outburst during a session. The team was able to calm her down.        Continued Psychosocial Services Needed Yes          Education: Education Goals: Education classes will be  provided on a weekly basis, covering required topics. Participant will state understanding/return demonstration of topics presented.  Learning Barriers/Preferences:     Learning Barriers/Preferences - 05/02/16 1102      Learning Barriers/Preferences   Learning Barriers None   Learning Preferences Written Material;Video;Group Instruction      Education Topics: How Lungs Work and Diseases: - Discuss the anatomy of the lungs and diseases that can affect the lungs, such as COPD.   Exercise: -Discuss the importance of exercise, FITT principles of exercise, normal and abnormal responses to exercise, and how to exercise safely.   Environmental Irritants: -Discuss types of environmental irritants and how to limit exposure to environmental irritants.   Meds/Inhalers and oxygen: - Discuss respiratory medications, definition of an inhaler and oxygen, and the proper way to use an inhaler and oxygen.   Energy Saving Techniques: - Discuss methods to conserve energy and decrease shortness of breath when performing activities of daily living.    Bronchial Hygiene / Breathing Techniques: - Discuss breathing mechanics, pursed-lip breathing technique,  proper posture, effective ways to clear airways, and other functional breathing techniques Flowsheet Row PULMONARY REHAB CHRONIC OBSTRUCTIVE PULMONARY DISEASE from 06/06/2016 in [facility]  Date  05/09/16  Educator  Nils Flack  Instruction Review Code  2- meets goals/outcomes      Cleaning Equipment: - Provides group verbal and written instruction about the health risks of elevated stress, cause of high stress, and healthy ways to reduce stress.   Nutrition I: Fats: - Discuss the types of cholesterol, what cholesterol does to the body, and how cholesterol levels can be controlled. Flowsheet Row PULMONARY REHAB CHRONIC OBSTRUCTIVE PULMONARY DISEASE from 06/06/2016 in [facility]  Date  05/16/16   Educator  Russella Dar  Instruction Review Code  2- meets goals/outcomes      Nutrition II: Labels: -Discuss the different components of food labels and how to read food labels.   Respiratory Infections: - Discuss the signs  and symptoms of respiratory infections, ways to prevent respiratory infections, and the importance of seeking medical treatment when having a respiratory infection. Flowsheet Row PULMONARY REHAB CHRONIC OBSTRUCTIVE PULMONARY DISEASE from 06/06/2016 in [facility]  Date  06/06/16  Educator  Lisabeth Register  Instruction Review Code  2- meets goals/outcomes      Stress I: Signs and Symptoms: - Discuss the causes of stress, how stress may lead to anxiety and depression, and ways to limit stress.   Stress II: Relaxation: -Discuss relaxation techniques to limit stress.   Oxygen for Home/Travel: - Discuss how to prepare for travel when on oxygen and proper ways to transport and store oxygen to ensure safety.   Knowledge Questionnaire Score:     Knowledge Questionnaire Score - 05/02/16 1104      Knowledge Questionnaire Score   Pre Score 6/14      Core Components/Risk Factors/Patient Goals at Admission:     Personal Goals and Risk Factors at Admission - 05/02/16 1241      Core Components/Risk Factors/Patient Goals on Admission    Weight Management Yes;Obesity   Intervention Weight Management: Develop a combined nutrition and exercise program designed to reach desired caloric intake, while maintaining appropriate intake of nutrient and fiber, sodium and fats, and appropriate energy expenditure required for the weight goal.   Increase Strength and Stamina Yes   Intervention Provide advice, education, support and counseling about physical activity/exercise needs.;Develop an individualized exercise prescription for aerobic and resistive training based on initial evaluation findings, risk stratification, comorbidities and participant's  personal goals.   Expected Outcomes Achievement of increased cardiorespiratory fitness and enhanced flexibility, muscular endurance and strength shown through measurements of functional capacity and personal statement of participant.   Improve shortness of breath with ADL's Yes   Intervention Provide education, individualized exercise plan and daily activity instruction to help decrease symptoms of SOB with activities of daily living.   Expected Outcomes Short Term: Achieves a reduction of symptoms when performing activities of daily living.   Develop more efficient breathing techniques such as purse lipped breathing and diaphragmatic breathing; and practicing self-pacing with activity Yes   Intervention Provide education, demonstration and support about specific breathing techniuqes utilized for more efficient breathing. Include techniques such as pursed lipped breathing, diaphragmatic breathing and self-pacing activity.   Expected Outcomes Short Term: Participant will be able to demonstrate and use breathing techniques as needed throughout daily activities.   Increase knowledge of respiratory medications and ability to use respiratory devices properly  Yes   Intervention Provide education and demonstration as needed of appropriate use of medications, inhalers, and oxygen therapy.   Expected Outcomes Short Term: Achieves understanding of medications use. Understands that oxygen is a medication prescribed by physician. Demonstrates appropriate use of inhaler and oxygen therapy.   Intervention Provide education about proper nutrition, including hydration, and aerobic/resistive exercise prescription along with prescribed medications to achieve blood glucose in normal ranges: Fasting glucose 65-99 mg/dL   Expected Outcomes Long Term: Attainment of HbA1C < 7%.   Personal Goal Other Yes   Personal Goal Get stronger and stay stronger. Do her regular ADL's without SOB.    Intervention Patient will exercise 2  days/week in pulmonary rehab and supplement at home with exercise 3 days/week.    Expected Outcomes Patient will meet her personal goals.       Core Components/Risk Factors/Patient Goals Review:      Goals and Risk Factor Review    Row Name 05/02/16 1244  05/16/16 1305 06/12/16 1430         Core Components/Risk Factors/Patient Goals Review   Personal Goals Review Weight Management/Obesity;Increase Strength and Stamina;Improve shortness of breath with ADL's Weight Management/Obesity;Increase Strength and Stamina;Improve shortness of breath with ADL's;Develop more efficient breathing techniques such as purse lipped breathing and diaphragmatic breathing and practicing self-pacing with activity.;Diabetes  Get stronger and stay stronger. Do her regular ADL's without SOB.  Weight Management/Obesity;Sedentary;Increase Strength and Stamina;Develop more efficient breathing techniques such as purse lipped breathing and diaphragmatic breathing and practicing self-pacing with activity.;Improve shortness of breath with ADL's;Diabetes  Get stronger and stay stronger. Do her regular ADL's without SOB.      Review  - Patient new to program. She has had 5 sessions. Will continue to monitor for progress.  Patient has attended 14 sessions. She has lost 7.7 lbs since starting the program. She is progressing well in the program with improved SOB and increased strength and stamina.      Expected Outcomes  - Patient will continue to attend sessions meeting her personal goals.  Patient will continue to attend sessions completing the program meeting her personal goals.         Core Components/Risk Factors/Patient Goals at Discharge (Final Review):      Goals and Risk Factor Review - 06/12/16 1430      Core Components/Risk Factors/Patient Goals Review   Personal Goals Review Weight Management/Obesity;Sedentary;Increase Strength and Stamina;Develop more efficient breathing techniques such as purse lipped breathing  and diaphragmatic breathing and practicing self-pacing with activity.;Improve shortness of breath with ADL's;Diabetes  Get stronger and stay stronger. Do her regular ADL's without SOB.    Review Patient has attended 14 sessions. She has lost 7.7 lbs since starting the program. She is progressing well in the program with improved SOB and increased strength and stamina.    Expected Outcomes Patient will continue to attend sessions completing the program meeting her personal goals.       ITP Comments:     ITP Comments    Row Name 05/09/16 1256           ITP Comments Patient met with Registered Dietitian to discuss nutrition topics including: Heart healthty eating, heart health cooking and make smart choices when shopping; Portion control; weight management; and hydration. Patient attended a group session with the hospital chaplian called [facility] to discuss and share how her recent pulmonary diagnosis has effected her life.          Comments: ITP 30 Day REVIEW Pt is making expected progress toward pulmonary rehab goals after completing 14 sessions. Recommend continued exercise, life style modification, education, and utilization of breathing techniques to increase stamina and strength and decrease shortness of breath with exertion.

## 2016-06-12 NOTE — Progress Notes (Signed)
97 Day Pulmonary Rehab Psychosocial Reassessment  Patient psychosocial assessment continues to reveal no barriers to participation in Pulmonary Rehab. Psychosocial areas that are currently affecting patient's rehab experience include concerns about health issues and emotional problems as evidenced by Bipolar Disorder. Patient  Exhibits some improvement in her coping skills to deal with her psychosocial concerns.  Offered emotional support and reassurance.  Patient does feel she is making progress toward Pulmonary Rehab goals. Patient reports her health and activity level have improved in the past 30 days as evidenced by patient's report of changed ability to cope with stressful situations. Patient states family/friends have noticed some changes in her activity or mood. Patient reports feeling positive about current and projected progression in Pulmonary Rehab. After reviewing the patient's treatment plan, the patient is making progress toward Pulmonary Rehab goals. [name] rate of progress toward rehab goals is fair. Plan of action to help patient continue to work towards rehab goals include continuing to attend weekly sessions. Will continue to monitor and evaluate progress toward psychosocial goal(s).  Goal(s) in progress: Improved management of stress and Bipolar Disorder Improved coping skills Help patient work toward returning to meaningful activities that improve patient's QOL and are attainable with patient's lung disease

## 2016-06-13 ENCOUNTER — Encounter (HOSPITAL_COMMUNITY)
Admission: RE | Admit: 2016-06-13 | Discharge: 2016-06-13 | Disposition: A | Discharge status: Home / Self Care | Payer: Medicare Other | Source: Ambulatory Visit | Attending: Pulmonary Disease | Admitting: Pulmonary Disease

## 2016-06-13 DIAGNOSIS — J449 Chronic obstructive pulmonary disease, unspecified: Secondary | ICD-10-CM

## 2016-06-13 NOTE — Progress Notes (Signed)
Daily Session Note  Patient Details  Name: Denise Freeman MRN: [id number] Date of Birth: 1951/01/09 Referring Provider:   Flowsheet Row PULMONARY REHAB COPD ORIENTATION from 05/02/2016 in [place]  Referring Provider  Dr. Halford Chessman      Encounter Date: 06/13/2016  Check In:     Session Check In - 06/13/16 0900      Check-In   Location [facility]   Staff Present Aundra Dubin, RN, BSN;Jahnay Lantier Luther Parody, BS, EP, Exercise Physiologist   Supervising physician immediately available to respond to emergencies See telemetry face sheet for immediately available MD   Medication changes reported     No   Fall or balance concerns reported    No   Warm-up and Cool-down Performed as group-led instruction   Resistance Training Performed Yes   VAD Patient? No     Pain Assessment   Currently in Pain? No/denies   Pain Score 0-No pain   Multiple Pain Sites No      Capillary Blood Glucose: No results found for this or any previous visit (from the past 24 hour(s)).   Goals Met:  Independence with exercise equipment Improved SOB with ADL's Using PLB without cueing & demonstrates good technique Exercise tolerated well No report of cardiac concerns or symptoms Strength training completed today  Goals Unmet:  Not Applicable  Comments: Check out 1000   Dr. Sinda Du is Medical Director for [facility] Pulmonary Rehab.

## 2016-06-13 NOTE — Progress Notes (Signed)
Denise Freeman presents to pulmonary rehab for /her bi-weekly exercise session. I have completed her thirty day face to face review and determined that Arnell Sieving Arreaga is on track for meeting their pulmonary rehab goals. There are barriers identified that will prevent them from continuing their exercise in pulmonary rehab as prescribed. She has multiple other medical problems in addition to her COPD but she feels strongly that she is doing much better with her participation in pulmonary rehabilitation program

## 2016-06-18 ENCOUNTER — Encounter (HOSPITAL_COMMUNITY)
Admission: RE | Admit: 2016-06-18 | Discharge: 2016-06-18 | Disposition: A | Discharge status: Home / Self Care | Payer: Medicare Other | Source: Ambulatory Visit | Attending: Pulmonary Disease | Admitting: Pulmonary Disease

## 2016-06-18 DIAGNOSIS — J449 Chronic obstructive pulmonary disease, unspecified: Secondary | ICD-10-CM | POA: Diagnosis not present

## 2016-06-18 NOTE — Progress Notes (Signed)
Daily Session Note  Patient Details  Name: Rianne Degraaf MRN: [id number] Date of Birth: Apr 14, 1951 Referring Provider:   Flowsheet Row PULMONARY REHAB COPD ORIENTATION from 05/02/2016 in [place]  Referring Provider  Dr. Halford Chessman      Encounter Date: 06/18/2016  Check In:     Session Check In - 06/18/16 1330      Check-In   Location [facility]   Staff Present Suzanne Boron, BS, EP, Exercise Physiologist;Diane Coad, MS, EP, [facility], Exercise Physiologist   Supervising physician immediately available to respond to emergencies See telemetry face sheet for immediately available MD   Medication changes reported     No   Fall or balance concerns reported    No   Warm-up and Cool-down Performed as group-led instruction   Resistance Training Performed Yes   VAD Patient? No     Pain Assessment   Currently in Pain? No/denies   Pain Score 0-No pain   Multiple Pain Sites No      Capillary Blood Glucose: No results found for this or any previous visit (from the past 24 hour(s)).   Goals Met:  Independence with exercise equipment Improved SOB with ADL's Using PLB without cueing & demonstrates good technique Exercise tolerated well No report of cardiac concerns or symptoms Strength training completed today  Goals Unmet:  Not Applicable  Comments: Check out 230   Dr. Sinda Du is Medical Director for [facility] Pulmonary Rehab.

## 2016-06-20 ENCOUNTER — Encounter (HOSPITAL_COMMUNITY): Payer: Medicare Other

## 2016-06-24 NOTE — Telephone Encounter (Signed)
Patient returning Sheri's call. I offered patient an appointment with [facility] but pt declined stating that she is requesting to speak to a nurse.

## 2016-06-24 NOTE — Telephone Encounter (Signed)
Left message for patient to call back  

## 2016-06-25 ENCOUNTER — Encounter (HOSPITAL_COMMUNITY)
Admission: RE | Admit: 2016-06-25 | Discharge: 2016-06-25 | Disposition: A | Discharge status: Home / Self Care | Payer: Medicare Other | Source: Ambulatory Visit | Attending: Pulmonary Disease | Admitting: Pulmonary Disease

## 2016-06-25 DIAGNOSIS — J449 Chronic obstructive pulmonary disease, unspecified: Secondary | ICD-10-CM | POA: Diagnosis not present

## 2016-06-25 NOTE — Progress Notes (Signed)
Daily Session Note  Patient Details  Name: Denise Freeman MRN: [id number] Date of Birth: 06/09/1950 Referring Provider:   Flowsheet Row PULMONARY REHAB COPD ORIENTATION from 05/02/2016 in [place] CARD[facility]  Referring Provider  Dr. Sood      Encounter Date: 06/25/2016  Check In:     Session Check In - 06/25/16 1330      Check-In   Location [facility]   Staff Present Diane Coad, MS, EP, [facility], Exercise Physiologist;Sabella Traore, BS, EP, Exercise Physiologist   Supervising physician immediately available to respond to emergencies See telemetry face sheet for immediately available MD   Fall or balance concerns reported    No   Warm-up and Cool-down Performed as group-led instruction   Resistance Training Performed Yes   VAD Patient? No     Pain Assessment   Currently in Pain? No/denies   Pain Score 0-No pain   Multiple Pain Sites No      Capillary Blood Glucose: No results found for this or any previous visit (from the past 24 hour(s)).   Goals Met:  Independence with exercise equipment Improved SOB with ADL's Using PLB without cueing & demonstrates good technique Exercise tolerated well No report of cardiac concerns or symptoms Strength training completed today  Goals Unmet:  Not Applicable  Comments: Check out 230   Dr. Edward Hawkins is Medical Director for [facility] Pul[facility] Re[facility]. 

## 2016-06-25 NOTE — Telephone Encounter (Signed)
Patient reports lethargy and dark stools.  She will come in on Thursday 06/27/16 at 3:00

## 2016-06-27 ENCOUNTER — Other Ambulatory Visit (INDEPENDENT_AMBULATORY_CARE_PROVIDER_SITE_OTHER): Payer: Medicare Other

## 2016-06-27 ENCOUNTER — Encounter: Payer: Self-pay | Admitting: Physician Assistant

## 2016-06-27 ENCOUNTER — Encounter (HOSPITAL_COMMUNITY): Payer: Medicare Other

## 2016-06-27 ENCOUNTER — Ambulatory Visit (INDEPENDENT_AMBULATORY_CARE_PROVIDER_SITE_OTHER): Payer: Medicare Other | Admitting: Physician Assistant

## 2016-06-27 VITALS — BP 130/58 | HR 96 | Ht 59.0 in | Wt 189.0 lb

## 2016-06-27 DIAGNOSIS — D508 Other iron deficiency anemias: Secondary | ICD-10-CM

## 2016-06-27 DIAGNOSIS — R634 Abnormal weight loss: Secondary | ICD-10-CM

## 2016-06-27 DIAGNOSIS — R195 Other fecal abnormalities: Secondary | ICD-10-CM | POA: Diagnosis not present

## 2016-06-27 LAB — CBC WITH DIFFERENTIAL/PLATELET
BASOS PCT: 0.4 % (ref 0.0–3.0)
Basophils Absolute: 0 10*3/uL (ref 0.0–0.1)
EOS PCT: 3.3 % (ref 0.0–5.0)
Eosinophils Absolute: 0.4 10*3/uL (ref 0.0–0.7)
HCT: 33.8 % — ABNORMAL LOW (ref 36.0–46.0)
Hemoglobin: 11.6 g/dL — ABNORMAL LOW (ref 12.0–15.0)
LYMPHS ABS: 2.8 10*3/uL (ref 0.7–4.0)
Lymphocytes Relative: 23 % (ref 12.0–46.0)
MCHC: 34.2 g/dL (ref 30.0–36.0)
MCV: 83.2 fl (ref 78.0–100.0)
MONO ABS: 0.4 10*3/uL (ref 0.1–1.0)
Monocytes Relative: 3.4 % (ref 3.0–12.0)
NEUTROS ABS: 8.4 10*3/uL — AB (ref 1.4–7.7)
Neutrophils Relative %: 69.9 % (ref 43.0–77.0)
PLATELETS: 289 10*3/uL (ref 150.0–400.0)
RBC: 4.07 Mil/uL (ref 3.87–5.11)
RDW: 13.1 % (ref 11.5–15.5)
WBC: 12 10*3/uL — ABNORMAL HIGH (ref 4.0–10.5)

## 2016-06-27 LAB — BASIC METABOLIC PANEL
BUN: 26 mg/dL — ABNORMAL HIGH (ref 6–23)
CALCIUM: 9 mg/dL (ref 8.4–10.5)
CO2: 36 mEq/L — ABNORMAL HIGH (ref 19–32)
CREATININE: 1.17 mg/dL (ref 0.40–1.20)
Chloride: 93 mEq/L — ABNORMAL LOW (ref 96–112)
GFR: 49.3 mL/min — AB (ref >60.00)
Glucose, Bld: 141 mg/dL — ABNORMAL HIGH (ref 70–99)
Potassium: 3.4 mEq/L — ABNORMAL LOW (ref 3.5–5.1)
SODIUM: 139 meq/L (ref 135–145)

## 2016-06-27 LAB — IBC PANEL
Iron: 62 ug/dL (ref 42–145)
SATURATION RATIOS: 16.1 % — AB (ref 20.0–50.0)
TRANSFERRIN: 275 mg/dL (ref 212.0–360.0)

## 2016-06-27 LAB — FERRITIN: Ferritin: 77.3 ng/mL (ref 10.0–291.0)

## 2016-06-27 NOTE — Patient Instructions (Addendum)
Please go to the basement level to have your labs drawn.    You have been scheduled for a CT scan of the abdomen and pelvis at [facility] ([address]---this is in the same building as Press [facility]).   You are scheduled on Wednesday 2-7 at 11:30 am. You should arrive at 9:15 minutes prior to your appointment time for registration. Please follow the written instructions below on the day of your exam:  WARNING: IF YOU ARE ALLERGIC TO IODINE/X-RAY DYE, PLEASE NOTIFY RADIOLOGY IMMEDIATELY AT [phone]! YOU WILL BE GIVEN A 13 HOUR PREMEDICATION PREP.  1) Do not eat or drink anything after 9:30 am (4 hours prior to your test)  You may take any medications as prescribed with a small amount of water except for the following: Metformin, Glucophage, Glucovance, Avandamet, Riomet, Fortamet, Actoplus Met, Janumet, Glumetza or Metaglip. The above medications must be held the day of the exam AND 48 hours after the exam.  The purpose of you drinking the oral contrast is to aid in the visualization of your intestinal tract. The contrast solution may cause some diarrhea. Before your exam is started, you will be given a small amount of fluid to drink. Depending on your individual set of symptoms, you may also receive an intravenous injection of x-ray contrast/dye. Plan on being at [facility] for 30 minutes or long, depending on the type of exam you are having performed.  If you have any questions regarding your exam or if you need to reschedule, you may call the CT department at [phone] between the hours of 8:00 am and 5:00 pm, Monday-Friday.  ________________________________________________________________________ CAPSULE ENDOSCOPY PATIENT INSTRUCTION SHEET  Tnia [facility] [birth year] [id number]   1. 06-26-2016 Seven (7) days prior to capsule endoscopy stop taking iron supplements and carafate.  2. 1-29 Two (2) days prior to capsule endoscopy stop taking aspirin  or any arthritis drugs.  3. 1-30 Day before capsule endoscopy purchase a 238 gram bottle of Miralax from the laxative section of your drug store, and a 32 oz. bottle of Gatorade (no red).    4. 1-30 One (1) day prior to capsule endoscopy: a) Stop smoking. b) Eat a regular diet until 12:00 Noon. c) After 12:00 Noon take only the following: Black coffee  Jell-O (no fruit or red Jell-o) Water   Bouillon (chicken or beef) 7-Up   Cranberry Juice Tea   Kool-Aid Popsicle (not red) Sprite   Coke Ginger Ale  Pepsi Mountain Dew Gatorade d) At 6:00 pm the evening before your appointment, drink 7 capfuls (105 grams) of Miralax with 32 oz. Gatorade. Drink 8 oz every 15 minutes until gone. e) Nothing to eat or drink after midnight except medications with a sip of water.  5. 1-31 Day of capsule endoscopy: Wear loose two-piece clothing to your exam. No medications for 2 hours prior to your test.  6. Please arrive at [facility]  3rd floor patient registration area by 8:00 am on: 07-03-2016.   For any questions: Call [facility] at [phone] and ask to speak with one of the capsule endoscopy nurses.  The above instructions have been reviewed and explained to me by________________   Patient signature:_________________________________________     Date:________________

## 2016-06-27 NOTE — Progress Notes (Addendum)
Subjective:    Patient ID: Denise Freeman, female    DOB: January 20, 1951, 66 y.o.   MRN: [id number]  HPI Denise Freeman is a pleasant 66 year old white female known to Dr. Carlean Purl. She has history of atrial fibrillation, congestive heart failure with EF of 4550%, sleep apnea, chronic respiratory failure with chronic O2 use, COPD, insulin-dependent diabetes mellitus, fibromyalgia and chronic kidney disease. She had undergone workup in October 2017 for chronic anemia, and intermittent dark stool. Initially seen in the setting of Xarelto which has since been stopped. EGD was unremarkable, colonoscopy revealed 3 polyps and was otherwise negative exam. Path  on polyps-all were tubular adenomas. Patient has been on oral iron supplementation. She comes in today with complaints of persistent intermittent very dark or black stools, and wants to know why this is occurring in Y she is still anemic. She also says she has lost a significant amount of weight which concerns her for cancer. She says she weighed 259 pounds about 2 years ago and today's weight is 189. She says her appetite is been fine and she has been eating normally.No  Focal abdominal discomfort but says at times it hurts across her lower abdomen. She says her stools are usually loose as well.  Patient has not gone back on Xarelto and says she will not go back on a blood thinner. She recently had an abrupt vision issue which she attributes to Xarelto. She also believes that Xarelto killed her sister. He is currently taking a baby aspirin.  Review of Systems Pertinent positive and negative review of systems were noted in the above HPI section.  All other review of systems was otherwise negative.  Outpatient Encounter Prescriptions as of 06/27/2016  Medication Sig  . aspirin 81 MG chewable tablet Chew 81 mg by mouth daily.  Marland Kitchen atorvastatin (LIPITOR) 40 MG tablet Take 1 tablet (40 mg total) by mouth daily.  . budesonide (PULMICORT) 0.5 MG/2ML nebulizer  solution USE 2 ML (0.5 MG TOTAL) VIA NEBULIZER TWICE A DAY  . cetirizine (ZYRTEC) 10 MG tablet Take 10 mg by mouth at bedtime.  . Cholecalciferol (VITAMIN D3) 400 units CAPS Take 800 Units by mouth daily.  Marland Kitchen diltiazem (CARDIZEM) 30 MG tablet Take 1 tablet (30 mg total) by mouth 2 (two) times daily.  Marland Kitchen docusate sodium (COLACE) 100 MG capsule Take 100 mg by mouth daily.  Marland Kitchen escitalopram (LEXAPRO) 10 MG tablet Take 10 mg by mouth at bedtime.   . fenofibrate 54 MG tablet Take 1 tablet (54 mg total) by mouth daily.  . ferrous sulfate 324 (65 Fe) MG TBEC Take 1 tablet by mouth daily.  . fluticasone (FLONASE) 50 MCG/ACT nasal spray Place 1 spray into both nostrils daily as needed for allergies or rhinitis.  . furosemide (LASIX) 40 MG tablet Take 2 tablets (80 mg total) by mouth 2 (two) times daily. (Patient taking differently: Take 60 mg by mouth 2 (two) times daily. )  . Insulin Glargine (TOUJEO SOLOSTAR) 300 UNIT/ML SOPN Inject 40-50 Units into the skin 2 times daily at 12 noon and 4 pm.  . insulin lispro (HUMALOG KWIKPEN) 100 UNIT/ML KiwkPen Inject 25-35 units under skin 3x a day (Patient taking differently: Inject 26-28 Units into the skin See admin instructions. Inject 26 units subque in the morning, 28 units subque before lunch and 28 units subque before dinner)  . Insulin Pen Needle 32G X 4 MM MISC Use to inject insulin 4 times daily.  Marland Kitchen ipratropium-albuterol (DUONEB) 0.5-2.5 (3) MG/3ML SOLN  Take 3 mLs by nebulization 4 (four) times daily.  . metFORMIN (GLUCOPHAGE-XR) 500 MG 24 hr tablet Take 1 tablet (500 mg total) by mouth 2 (two) times daily.  . metolazone (ZAROXOLYN) 2.5 MG tablet Take 1 tablet (2.5 mg total) by mouth daily.  . mirtazapine (REMERON) 15 MG tablet Take 15 mg by mouth at bedtime.  . modafinil (PROVIGIL) 200 MG tablet Take 1 tablet (200 mg total) by mouth daily.  . montelukast (SINGULAIR) 10 MG tablet Take 1 tablet (10 mg total) by mouth at bedtime.  . mupirocin cream (BACTROBAN) 2  % Apply 1 application topically 2 (two) times daily.  . nitroGLYCERIN (NITROSTAT) 0.4 MG SL tablet Place 1 tablet (0.4 mg total) under the tongue every 5 (five) minutes as needed for chest pain.  Marland Kitchen nystatin (NYSTATIN) powder Apply topically 2 (two) times daily. Apply under breasts  . ondansetron (ZOFRAN-ODT) 4 MG disintegrating tablet Take 1 tablet (4 mg total) by mouth 2 (two) times daily as needed for nausea or vomiting.  . OXYGEN Inhale 3 L into the lungs continuous. 3 liters 24/7  . pantoprazole (PROTONIX) 20 MG tablet Take 1 tablet (20 mg total) by mouth 2 (two) times daily before a meal.  . potassium chloride SA (K-DUR,KLOR-CON) 20 MEQ tablet Take 1 tablet (20 mEq total) by mouth 3 (three) times daily.  Marland Kitchen PRESCRIPTION MEDICATION Inhale into the lungs See admin instructions. Use BIPAP every time laying down  . primidone (MYSOLINE) 50 MG tablet Take 1 tablet (50 mg total) by mouth at bedtime.  . Probiotic Product (PROBIOTIC PO) Take 1 tablet by mouth at bedtime.  . triamcinolone lotion (KENALOG) 0.1 % Apply 1 application topically 3 (three) times daily. For up to 14 days and as needed  . VITAMIN E PO Take 1 tablet by mouth daily.  . Vitamins A & D (VITAMIN A & D PO) Take by mouth.  . [DISCONTINUED] albuterol (PROVENTIL) (2.5 MG/3ML) 0.083% nebulizer solution Take 3 mLs (2.5 mg total) by nebulization every 6 (six) hours as needed for wheezing or shortness of breath.  . [DISCONTINUED] oxyCODONE (OXY IR/ROXICODONE) 5 MG immediate release tablet Take 5 mg by mouth 2 (two) times daily.   No facility-administered encounter medications on file as of 06/27/2016.    Allergies  Allergen Reactions  . Xarelto [Rivaroxaban] Other (See Comments)    Internal bleeding  . Ancef [Cefazolin] Nausea And Vomiting  . Levaquin [Levofloxacin In D5w] Other (See Comments)    "afib"  . Lyrica [Pregabalin] Hives  . Tamiflu [Oseltamivir Phosphate] Other (See Comments)    "water blisters"  . Zoloft [Sertraline Hcl]  Other (See Comments)    Jaw problems, jittery  . Augmentin [Amoxicillin-Pot Clavulanate] Itching  . Ciprofloxacin Itching  . Haldol [Haloperidol] Other (See Comments)    Restless leg  . Nsaids Diarrhea  . Penicillins Itching, Nausea And Vomiting and Rash    Has patient had a PCN reaction causing immediate rash, facial/tongue/throat swelling, SOB or lightheadedness with hypotension: Yes Has patient had a PCN reaction causing severe rash involving mucus membranes or skin necrosis: No Has patient had a PCN reaction that required hospitalization No Has patient had a PCN reaction occurring within the last 10 years: Yes If all of the above answers are "NO", then may proceed with Cephalosporin use.  . Topamax [Topiramate] Nausea Only   Patient Active Problem List   Diagnosis Date Noted  . Vision loss 04/19/2016  . Hx of colonic polyps 03/21/2016  . Blood in  stool   . Benign neoplasm of cecum   . Benign neoplasm of rectum   . Insulin dependent type 2 diabetes mellitus, uncontrolled ([place]) 03/14/2016  . CKD (chronic kidney disease) stage 3, GFR 30-59 ml/min 03/14/2016  . Melena 03/14/2016  . HLD (hyperlipidemia) 03/14/2016  . Scalp lesion 03/14/2016  . Peripheral polyneuropathy ([place]) 01/26/2016  . Vitamin D deficiency 09/26/2015  . Diabetes mellitus with ophthalmic manifestations 08/28/2015  . Chronic upper GI bleeding 06/24/2015  . COPD with asthma ([place]) 02/22/2015  . Bilateral leg edema 10/13/2014  . OSA (obstructive sleep apnea) 08/22/2014  . Chronic hypoxic, hypercapnic respiratory failure 04/21/2014  . Microcytic anemia 04/21/2014  . Fibromyalgia 04/14/2014  . Uncontrolled secondary diabetes mellitus with stage 3 CKD (GFR 30-59) ([facility]) 04/14/2014  . Bipolar 1 disorder ([place]) 04/14/2014  . Atrial fibrillation [I48.91] 04/12/2014   Social History   Social History  . Marital status: Married    Spouse name: N/A  . Number of children: 1  . Years of education: N/A   Occupational  History  . house wife    Social History Main Topics  . Smoking status: Former Smoker    Packs/day: 2.00    Years: 14.00    Types: Cigarettes    Quit date: 04/12/1989  . Smokeless tobacco: Never Used  . Alcohol use No  . Drug use: No  . Sexual activity: No   Other Topics Concern  . Not on file   Social History Narrative   Married   Disabled   1 child; 52 yrs   6 pregnancies   1 child       Denise Freeman's family history includes Breast cancer in her mother; COPD in her mother and sister; Diabetes in her mother and sister; Heart disease in her father, maternal grandmother, mother, and sister; Hyperlipidemia in her father.      Objective:    Vitals:   06/27/16 1449  BP: (!) 130/58  Pulse: 96    Physical Exam  well-developed older white female, in no acute distress, she comes in in a scooter on 3 L of nasal O2 chronically. Very Talkative. Blood pressure 130/58 pulse 96, height 4 foot 11, weight 189, BMI 38.1. HEENT; nontraumatic normocephalic EOMI PERRLA sclera anicteric, Cardiovascular; regular rate and rhythm with S1-S2 no murmur or gallop, Pulmonary ; decreased breath sounds bilaterally, Abdomen ;obese, soft, nontender there is no palpable mass or hepatosplenomegaly bowel sounds are present, Rectal ;exam stool brown and Hemoccult negative, Ext; no clubbing cyanosis or edema skin warm and dry, Neuropsych ;mood and affect appropriate       Assessment & Plan:   #41 66 year old white female with history of iron deficiency anemia and intermittent dark stools. EGD and colonoscopy October 2017 unrevealing as to source. Though patient Hemoccult negative today she relates intermittent very dark stools which are concerning for melena. Will need to rule out small bowel source for intermittent blood loss #2 weight loss, unintentional. Patient has lost about 60 pounds over the past 2 years. This may be secondary to advanced COPD, rule out occult malignancy #3 chronic respiratory failure  requiring chronic O2, advanced COPD #4 insulin-dependent diabetes mellitus #5 fibromyalgia #6 congestive heart failure #7 A. fib #8 chronic kidney disease  Plan; check CBC with differential, BMET, and iron studies today Patient will be scheduled for capsule endoscopy. Procedure discussed in detail with patient and she is agreeable to proceed Will also schedule for CT scan of the abdomen and pelvis with contrast. Patient is  adamant that she's unable to drink oral contrast but may be willing to try water based contrast. Further plans pending results of above.   Amy S Esterwood PA-C 06/27/2016   Cc: [name] Moe*\  Agree with Ms. Genia Harold assessment and plan. Gatha Mayer, MD, [facility]

## 2016-06-28 ENCOUNTER — Telehealth: Payer: Self-pay | Admitting: Physician Assistant

## 2016-06-28 NOTE — Telephone Encounter (Signed)
Rescheduled with the patient

## 2016-07-02 ENCOUNTER — Encounter (HOSPITAL_COMMUNITY)
Admission: RE | Admit: 2016-07-02 | Discharge: 2016-07-02 | Disposition: A | Discharge status: Home / Self Care | Payer: Medicare Other | Source: Ambulatory Visit | Attending: Pulmonary Disease | Admitting: Pulmonary Disease

## 2016-07-02 DIAGNOSIS — J449 Chronic obstructive pulmonary disease, unspecified: Secondary | ICD-10-CM | POA: Diagnosis not present

## 2016-07-02 NOTE — Progress Notes (Signed)
Daily Session Note  Patient Details  Name: Denise Freeman MRN: [id number] Date of Birth: [birth year] Referring Provider:   Flowsheet Row PULMONARY REHAB COPD ORIENTATION from 05/02/2016 in [place]  Referring Provider  Dr. Halford Chessman      Encounter Date: 07/02/2016  Check In:     Session Check In - 07/02/16 1045      Check-In   Location [facility]   Staff Present Diane Angelina Pih, MS, EP, [facility], Exercise Physiologist;Veena Sturgess Luther Parody, BS, EP, Exercise Physiologist   Supervising physician immediately available to respond to emergencies See telemetry face sheet for immediately available MD   Medication changes reported     No   Fall or balance concerns reported    No   Warm-up and Cool-down Performed as group-led instruction   Resistance Training Performed Yes   VAD Patient? No     Pain Assessment   Currently in Pain? No/denies   Pain Score 0-No pain   Multiple Pain Sites No      Capillary Blood Glucose: No results found for this or any previous visit (from the past 24 hour(s)).   Goals Met:  Independence with exercise equipment Improved SOB with ADL's Using PLB without cueing & demonstrates good technique Exercise tolerated well No report of cardiac concerns or symptoms Strength training completed today  Goals Unmet:  Not Applicable  Comments: Check out 1145   Dr. Sinda Du is Medical Director for [facility] Pulmonary Rehab.

## 2016-07-04 ENCOUNTER — Encounter (HOSPITAL_COMMUNITY): Payer: Medicare Other

## 2016-07-04 DIAGNOSIS — M549 Dorsalgia, unspecified: Secondary | ICD-10-CM | POA: Diagnosis not present

## 2016-07-04 DIAGNOSIS — M5136 Other intervertebral disc degeneration, lumbar region: Secondary | ICD-10-CM | POA: Diagnosis not present

## 2016-07-04 DIAGNOSIS — M5032 Other cervical disc degeneration, mid-cervical region, unspecified level: Secondary | ICD-10-CM | POA: Diagnosis not present

## 2016-07-04 DIAGNOSIS — M797 Fibromyalgia: Secondary | ICD-10-CM | POA: Diagnosis not present

## 2016-07-04 DIAGNOSIS — Z79891 Long term (current) use of opiate analgesic: Secondary | ICD-10-CM | POA: Diagnosis not present

## 2016-07-04 DIAGNOSIS — M542 Cervicalgia: Secondary | ICD-10-CM | POA: Diagnosis not present

## 2016-07-04 DIAGNOSIS — Z79899 Other long term (current) drug therapy: Secondary | ICD-10-CM | POA: Diagnosis not present

## 2016-07-04 DIAGNOSIS — G894 Chronic pain syndrome: Secondary | ICD-10-CM | POA: Diagnosis not present

## 2016-07-05 ENCOUNTER — Other Ambulatory Visit: Payer: Medicare Other

## 2016-07-09 ENCOUNTER — Encounter (HOSPITAL_COMMUNITY)
Admission: RE | Admit: 2016-07-09 | Discharge: 2016-07-09 | Disposition: A | Discharge status: Home / Self Care | Payer: Medicare Other | Source: Ambulatory Visit | Attending: Pulmonary Disease | Admitting: Pulmonary Disease

## 2016-07-09 DIAGNOSIS — J449 Chronic obstructive pulmonary disease, unspecified: Secondary | ICD-10-CM | POA: Diagnosis not present

## 2016-07-09 NOTE — Progress Notes (Signed)
Denise Freeman presents to pulmonary rehab for /her bi-weekly exercise session. I have completed /her thirty day face to face review and determined that [name] is on track for meeting their pulmonary rehab goals. There are not barriers identified that will prevent them from continuing their exercise in pulmonary rehab as prescribed.

## 2016-07-09 NOTE — Progress Notes (Signed)
Daily Session Note  Patient Details  Name: Denise Freeman MRN: [id number] Date of Birth: 05-Aug-1950 Referring Provider:   Flowsheet Row PULMONARY REHAB COPD ORIENTATION from 05/02/2016 in [place]  Referring Provider  Dr. Halford Chessman      Encounter Date: 07/09/2016  Check In:     Session Check In - 07/09/16 0900      Check-In   Location [facility]   Staff Present Suzanne Boron, BS, EP, Exercise Physiologist;Diane Coad, MS, EP, [facility], Exercise Physiologist   Supervising physician immediately available to respond to emergencies See telemetry face sheet for immediately available MD   Medication changes reported     No   Fall or balance concerns reported    No   Warm-up and Cool-down Performed as group-led instruction   Resistance Training Performed Yes   VAD Patient? No     Pain Assessment   Currently in Pain? No/denies   Pain Score 0-No pain   Multiple Pain Sites No      Capillary Blood Glucose: No results found for this or any previous visit (from the past 24 hour(s)).   Goals Met:  Independence with exercise equipment Improved SOB with ADL's Using PLB without cueing & demonstrates good technique Exercise tolerated well No report of cardiac concerns or symptoms Strength training completed today  Goals Unmet:  Not Applicable  Comments: Check out 1000   Dr. Sinda Du is Medical Director for [facility] Pulmonary Rehab.

## 2016-07-10 ENCOUNTER — Inpatient Hospital Stay: Admission: RE | Admit: 2016-07-10 | Payer: Medicare Other | Source: Ambulatory Visit

## 2016-07-10 ENCOUNTER — Ambulatory Visit: Payer: Medicare Other | Admitting: Neurology

## 2016-07-11 ENCOUNTER — Encounter (HOSPITAL_COMMUNITY)
Admission: RE | Admit: 2016-07-11 | Discharge: 2016-07-11 | Disposition: A | Discharge status: Home / Self Care | Payer: Medicare Other | Source: Ambulatory Visit | Attending: Pulmonary Disease | Admitting: Pulmonary Disease

## 2016-07-11 DIAGNOSIS — J449 Chronic obstructive pulmonary disease, unspecified: Secondary | ICD-10-CM

## 2016-07-11 NOTE — Progress Notes (Signed)
Patient psychosocial assessment continues to reveal no barriers to participation in Pulmonary Rehab. Psychosocial areas that continue to affect patient's rehab experience include concerns about healthissues and emotional problems as evidenced by Bipolar Disorder.Patient continues to exhibit some improvement in hercoping skills to deal with her psychosocial concerns. Offered emotional support and reassurance. Patient doesfeel she is making progress toward Pulmonary Rehab goals. Patient reports her health and activity level haveimproved in the past 30 days as evidenced by patient's report of changedability to cope with stressful situations.  Patient reports feeling positive about current and projected progression in Pulmonary Rehab. She has attended 21 sessions. After reviewing the patient's treatment plan, the patient ismaking progress toward Pulmonary Rehab goals. [name] rate of progress toward rehab goals is fair. Plan of action to help patient continue to work towards rehab goals include continuing to attend weekly sessions. Will continue to monitor and evaluate progress toward psychosocial goal(s).  Goal(s) in progress: Improved management of stress and Bipolar Disorder Improved coping skills Help patient work toward returning to meaningful activities that improve patient's QOL and are attainable with patient's lung disease

## 2016-07-11 NOTE — Progress Notes (Signed)
Pulmonary Individual Treatment Plan  Patient Details  Name: Denise Freeman MRN: [id number] Date of Birth: 03/03/1951 Referring Provider:   Flowsheet Row PULMONARY REHAB COPD ORIENTATION from 05/02/2016 in [place]  Referring Provider  Dr. Halford Chessman      Initial Encounter Date:  Flowsheet Row PULMONARY REHAB COPD ORIENTATION from 05/02/2016 in [place]  Date  05/02/16  Referring Provider  Dr. 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  aspirin 81 MG chewable tablet, Chew 81 mg by mouth daily., Disp: , Rfl:  .  atorvastatin (LIPITOR) 40 MG tablet, Take 1 tablet (40 mg total) by mouth daily., Disp: 90 tablet, Rfl: 2 .  budesonide (PULMICORT) 0.5 MG/2ML nebulizer solution, USE 2 ML (0.5 MG TOTAL) VIA NEBULIZER TWICE A DAY, Disp: 360 mL, Rfl: 3 .  cetirizine (ZYRTEC) 10 MG tablet, Take 10 mg by mouth at bedtime., Disp: , Rfl:  .  Cholecalciferol (VITAMIN D3) 400 units CAPS, Take 800 Units by mouth daily., Disp: 180 capsule, Rfl: 2 .  diltiazem (CARDIZEM) 30 MG tablet, Take 1 tablet (30 mg total) by mouth 2 (two) times daily., Disp: 180 tablet, Rfl: 3 .  docusate sodium (COLACE) 100 MG capsule, Take 100 mg by mouth daily., Disp: , Rfl:  .  escitalopram (LEXAPRO) 10 MG tablet, Take 10 mg by mouth at bedtime. , Disp: , Rfl:  .  fenofibrate 54 MG tablet, Take 1 tablet (54 mg total) by mouth daily., Disp: 90 tablet, Rfl: 3 .  ferrous sulfate 324 (65 Fe) MG TBEC, Take 1 tablet by mouth daily., Disp: , Rfl:  .  fluticasone (FLONASE) 50 MCG/ACT nasal spray, Place 1 spray into both nostrils daily as needed for allergies or rhinitis., Disp: 32 g, Rfl: 6 .  furosemide (LASIX) 40 MG tablet, Take 2 tablets (80 mg total) by mouth 2 (two) times daily. (Patient taking differently: Take 60 mg by mouth 2 (two) times daily. ), Disp: 360 tablet, Rfl: 3 .  Insulin  Glargine (TOUJEO SOLOSTAR) 300 UNIT/ML SOPN, Inject 40-50 Units into the skin 2 times daily at 12 noon and 4 pm., Disp: 30 pen, Rfl: 1 .  insulin lispro (HUMALOG KWIKPEN) 100 UNIT/ML KiwkPen, Inject 25-35 units under skin 3x a day (Patient taking differently: Inject 26-28 Units into the skin See admin instructions. Inject 26 units subque in the morning, 28 units subque before lunch and 28 units subque before dinner), Disp: 35 pen, Rfl: 1 .  Insulin Pen Needle 32G X 4 MM MISC, Use to inject insulin 4 times daily., Disp: 400 each, Rfl: 3 .  ipratropium-albuterol (DUONEB) 0.5-2.5 (3) MG/3ML SOLN, Take 3 mLs by nebulization 4 (four) times daily., Disp: 1080 mL, Rfl: 1 .  metFORMIN (GLUCOPHAGE-XR) 500 MG 24 hr tablet, Take 1 tablet (500 mg total) by mouth 2 (two) times daily., Disp: 180 tablet, Rfl: 1 .  metolazone (ZAROXOLYN) 2.5 MG tablet, Take 1 tablet (2.5 mg total) by mouth daily., Disp: 90 tablet, Rfl: 3 .  mirtazapine (REMERON) 15 MG tablet, Take 15 mg by mouth at bedtime., Disp: , Rfl:  .  modafinil (PROVIGIL) 200 MG tablet, Take 1 tablet (200 mg total) by mouth daily., Disp: 90 tablet, Rfl: 0 .  montelukast (SINGULAIR) 10 MG tablet, Take 1 tablet (10 mg total) by mouth at bedtime., Disp: 90 tablet, Rfl: 1 .  mupirocin cream (BACTROBAN) 2 %, Apply  1 application topically 2 (two) times daily., Disp: 15 g, Rfl: 0 .  nitroGLYCERIN (NITROSTAT) 0.4 MG SL tablet, Place 1 tablet (0.4 mg total) under the tongue every 5 (five) minutes as needed for chest pain., Disp: 25 tablet, Rfl: 3 .  nystatin (NYSTATIN) powder, Apply topically 2 (two) times daily. Apply under breasts, Disp: 15 g, Rfl: 3 .  ondansetron (ZOFRAN-ODT) 4 MG disintegrating tablet, Take 1 tablet (4 mg total) by mouth 2 (two) times daily as needed for nausea or vomiting., Disp: 30 tablet, Rfl: 1 .  OXYGEN, Inhale 3 L into the lungs continuous. 3 liters 24/7, Disp: , Rfl:  .  pantoprazole (PROTONIX) 20 MG tablet, Take 1 tablet (20 mg total) by  mouth 2 (two) times daily before a meal., Disp: 180 tablet, Rfl: 1 .  potassium chloride SA (K-DUR,KLOR-CON) 20 MEQ tablet, Take 1 tablet (20 mEq total) by mouth 3 (three) times daily., Disp: 270 tablet, Rfl: 1 .  PRESCRIPTION MEDICATION, Inhale into the lungs See admin instructions. Use BIPAP every time laying down, Disp: , Rfl:  .  primidone (MYSOLINE) 50 MG tablet, Take 1 tablet (50 mg total) by mouth at bedtime., Disp: 90 tablet, Rfl: 1 .  Probiotic Product (PROBIOTIC PO), Take 1 tablet by mouth at bedtime., Disp: , Rfl:  .  triamcinolone lotion (KENALOG) 0.1 %, Apply 1 application topically 3 (three) times daily. For up to 14 days and as needed, Disp: 60 mL, Rfl: 1 .  VITAMIN E PO, Take 1 tablet by mouth daily., Disp: , Rfl:  .  Vitamins A & D (VITAMIN A & D PO), Take by mouth., Disp: , Rfl:   Past Medical History: Past Medical History:  Diagnosis Date  . Anemia   . Anxiety   . Asthma   . Atrial fibrillation ([place])   . Bipolar 1 disorder ([place])   . 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 CHF (congestive heart failure) ([place])   . Chronic back pain   . Chronic bronchitis ([place])   . Colon polyps   . Complication of anesthesia    postop nausea/vomiting  . COPD (chronic obstructive pulmonary disease) ([place])   . Coronary artery disease   . Depression   . Family history of adverse reaction to anesthesia    Uncle was positive for malignant hyperthermia; patient had testing done and was negative.  . Fibromyalgia   . GERD (gastroesophageal reflux disease)   . Gout   . High cholesterol   . History of blood transfusion 06/2015   "bleeding from my rectum"  . History of hiatal hernia   . HOH (hard of hearing)   . Hx of colonic polyps 03/21/2016   3 small adenomas no recall - co-morbidities  . Myocardial infarction 1990   "mild"  . 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 On home oxygen therapy    "3L; 24/7" (03/14/2016)  . OSA treated with BiPAP    uses biPAP, 10 (03/14/2016)  .  Osteoarthritis   . Pneumonia   . Shortness of breath dyspnea   . Type II diabetes mellitus ([facility])     Tobacco Use: History  Smoking Status  . Former Smoker  . Packs/day: 2.00  . Years: 14.00  . Types: Cigarettes  . Quit date: 04/12/1989  Smokeless Tobacco  . Never Used    Labs: Recent Review Flowsheet Data    Labs for ITP Cardiac and Pulmonary Rehab Latest Ref Rng & Units  06/25/2015 11/28/2015 02/28/2016 03/14/2016 04/19/2016   Cholestrol 0 - 200 mg/dL - - - - 236(H)   LDLDIRECT mg/dL - - - - 153.0   HDL >39.00 mg/dL - - - - 37.70(L)   Trlycerides 0.0 - 149.0 mg/dL - - - - 258.0(H)   Hemoglobin A1c 4.8 - 5.6 % 6.6(H) 7.8 9.0 8.9(H) -      Capillary Blood Glucose: Lab Results  Component Value Date   GLUCAP 176 (H) 03/15/2016   GLUCAP 149 (H) 03/15/2016   GLUCAP 117 (H) 03/14/2016   GLUCAP 137 (H) 03/14/2016   GLUCAP 152 (H) 06/26/2015     ADL UCSD:     Pulmonary Assessment Scores    Row Name 05/02/16 1149         ADL UCSD   ADL Phase Entry     SOB Score total 62     Rest 2     Walk 11     Stairs 5     Bath 3     Dress 3       CAT Score   CAT Score 31       mMRC Score   mMRC Score 2        Pulmonary Function Assessment:     Pulmonary Function Assessment - 05/02/16 1120      Pulmonary Function Tests   FVC% 31 %   FEV1% 22 %   FEV1/FVC Ratio 70   RV% 1.88 %   DLCO% 19.55 %     Initial Spirometry Results   FVC% 35 %   FEV1% 30 %   FEV1/FVC Ratio 65   Comments Patient is lelgally blind and rides a skooter to get around.     Post Bronchodilator Spirometry Results   FVC% 35 %   FEV1% 30 %   FEV1/FVC Ratio 65     Breath   Bilateral Breath Sounds Decreased   Shortness of Breath Yes      Exercise Target Goals:    Exercise Program Goal: Individual exercise prescription set with THRR, safety & activity barriers. Participant demonstrates ability to understand and report RPE using BORG scale, to self-measure pulse accurately, and to  acknowledge the importance of the exercise prescription.  Exercise Prescription Goal: Starting with aerobic activity 30 plus minutes a day, 3 days per week for initial exercise prescription. Provide home exercise prescription and guidelines that participant acknowledges understanding prior to discharge.  Activity Barriers & Risk Stratification:     Activity Barriers & Cardiac Risk Stratification - 05/02/16 1101      Activity Barriers & Cardiac Risk Stratification   Activity Barriers Shortness of Breath;History of Falls;Assistive Device;Other (comment)   Comments Legally Blind   Cardiac Risk Stratification High      6 Minute Walk:   Initial Exercise Prescription:     Initial Exercise Prescription - 05/02/16 1100      Date of Initial Exercise RX and Referring Provider   Date 05/02/16   Referring Provider Dr. Halford Chessman     Oxygen   Oxygen Continuous   Liters 3     NuStep   Level 2   Watts 15   Minutes 15   METs 1.8     Arm Ergometer   Level 1.5   Watts 15   Minutes 20   METs 1.9     Prescription Details   Frequency (times per week) 3   Duration Progress to 30 minutes of continuous aerobic without signs/symptoms of physical distress  Intensity   THRR REST +  20   THRR 40-80% of Max Heartrate 113-127   Ratings of Perceived Exertion 11-13   Perceived Dyspnea 0-4     Progression   Progression Continue progressive overload as per policy without signs/symptoms or physical distress.     Resistance Training   Training Prescription Yes   Weight 1   Reps 10-12      Perform Capillary Blood Glucose checks as needed.  Exercise Prescription Changes:      Exercise Prescription Changes    Row Name 06/10/16 1400 07/09/16 1300           Exercise Review   Progression Yes Yes        Response to Exercise   Blood Pressure (Admit) 130/60 130/62      Blood Pressure (Exercise) 170/60 144/74      Blood Pressure (Exit) 122/52 138/72      Heart Rate (Admit) 83 bpm  90 bpm      Heart Rate (Exercise) 83 bpm 74 bpm      Heart Rate (Exit) 52 bpm 97 bpm      Oxygen Saturation (Admit) 98 % 92 %      Oxygen Saturation (Exercise) 92 % 93 %      Oxygen Saturation (Exit) 98 % 99 %      Rating of Perceived Exertion (Exercise) 10 9      Perceived Dyspnea (Exercise) 10 9      Duration Progress to 30 minutes of continuous aerobic without signs/symptoms of physical distress Progress to 30 minutes of continuous aerobic without signs/symptoms of physical distress      Intensity Rest + 20 Rest + 20        Progression   Progression Continue progressive overload as per policy without signs/symptoms or physical distress. Continue progressive overload as per policy without signs/symptoms or physical distress.        Resistance Training   Training Prescription Yes Yes      Weight 2 3      Reps 10-12 10-12        Oxygen   Oxygen Continuous Continuous      Liters 3 3        NuStep   Level 2 3      Watts 16 26      Minutes 20 20      METs 3.59 3.6        Arm Ergometer   Level 2.3 3      Watts 29 34      Minutes 20 15      METs 2.1 3.6        Home Exercise Plan   Plans to continue exercise at [place] 2 additional days to program exercise sessions. Add 2 additional days to program exercise sessions.         Exercise Comments:      Exercise Comments    Row Name 05/09/16 1536 06/10/16 1404 07/09/16 1311       Exercise Comments Patient has just started pulmonary rehab and will be proggressed appropriately  Patient is progressing well. Patient is progressing well        Discharge Exercise Prescription (Final Exercise Prescription Changes):     Exercise Prescription Changes - 07/09/16 1300      Exercise Review   Progression Yes     Response to Exercise   Blood Pressure (Admit) 130/62   Blood Pressure (Exercise)  144/74   Blood Pressure (Exit) 138/72   Heart Rate (Admit) 90 bpm   Heart Rate (Exercise) 74 bpm   Heart Rate  (Exit) 97 bpm   Oxygen Saturation (Admit) 92 %   Oxygen Saturation (Exercise) 93 %   Oxygen Saturation (Exit) 99 %   Rating of Perceived Exertion (Exercise) 9   Perceived Dyspnea (Exercise) 9   Duration Progress to 30 minutes of continuous aerobic without signs/symptoms of physical distress   Intensity Rest + 20     Progression   Progression Continue progressive overload as per policy without signs/symptoms or physical distress.     Resistance Training   Training Prescription Yes   Weight 3   Reps 10-12     Oxygen   Oxygen Continuous   Liters 3     NuStep   Level 3   Watts 26   Minutes 20   METs 3.6     Arm Ergometer   Level 3   Watts 34   Minutes 15   METs 3.6     Home Exercise Plan   Plans to continue exercise at Home   Frequency Add 2 additional days to program exercise sessions.      Nutrition:  Target Goals: Understanding of nutrition guidelines, daily intake of sodium <1555m, cholesterol <2069m calories 30% from fat and 7% or less from saturated fats, daily to have 5 or more servings of fruits and vegetables.  Biometrics:     Pre Biometrics - 05/02/16 1141      Pre Biometrics   Height _0  (1.499 m)   Weight 194 lb 3.6 oz (88.1 kg)   Waist Circumference 46 inches   Hip Circumference 43 inches   Waist to Hip Ratio 1.07 %   BMI (Calculated) 39.3   Triceps Skinfold 7 mm   % Body Fat 43.1 %   Grip Strength 57.67 kg   Flexibility 0 in   Single Leg Stand 5 seconds       Nutrition Therapy Plan and Nutrition Goals:   Nutrition Discharge: Rate Your Plate Scores:     Nutrition Assessments - 05/02/16 1232      Rate Your Plate Scores   Pre Score 51      Psychosocial: Target Goals: Acknowledge presence or absence of depression, maximize coping skills, provide positive support system. Participant is able to verbalize types and ability to use techniques and skills needed for reducing stress and depression.  Initial Review & Psychosocial  Screening:     Initial Psych Review & Screening - 05/02/16 [place]     Barriers   Psychosocial barriers to participate in program There are no identifiable barriers or psychosocial needs.     Screening Interventions   Interventions Encouraged to exercise      Quality of Life Scores:     Quality of Life - 05/02/16 1143      Quality of Life Scores   Health/Function Pre 18.94 %   Socioeconomic Pre 19.92 %   Psych/Spiritual Pre 29.64 %   Family Pre 26.4 %   GLOBAL Pre 22.41 %      PHQ-9: Recent Review Flowsheet Data    [facility] 06/07/2016 05/02/2016 04/19/2016   Decreased Interest 0 0 0   Down, Depressed, Hopeless 0 1 0   PHQ - 2 Score 0 1 0   Altered sleeping - 1 -   Tired, decreased energy -  1 -   Change in appetite - 1 -   Feeling bad or failure about yourself  - 1 -   Trouble concentrating - 0 -   Moving slowly or fidgety/restless - 0 -   Suicidal thoughts - 0 -   PHQ-9 Score - 5 -   Difficult doing work/chores - Somewhat difficult -      Psychosocial Evaluation and Intervention:     Psychosocial Evaluation - 05/02/16 1245      Psychosocial Evaluation & Interventions   Interventions Encouraged to exercise with the program and follow exercise prescription   Comments Patient has no psychosocial barriers identified.    Continued Psychosocial Services Needed No      Psychosocial Re-Evaluation:     Psychosocial Re-Evaluation    [place] Name 05/16/16 1306 07/11/16 0847           Psychosocial Re-Evaluation   Interventions Encouraged to attend Pulmonary Rehabilitation for the exercise;Stress management education Encouraged to attend Pulmonary Rehabilitation for the exercise;Stress management education;Relaxation education      Comments Patient does have Bipolar disorder. [name] QOL score was 22.41 and her PHQ-9 score was 6. She is on an anti-anxiety medication. She has had one emotional outburst  during a session. The team was able to calm her down.  90 Day Psychosocial Assessment: Patient psychosocial assessment continues to reveal no barriers to participation in Pulmonary Rehab. Psychosocial areas that continue to affect patient's rehab experience include concerns about healthissues and emotional problems as evidenced by Bipolar Disorder.Patient continues to exhibit some improvement in hercoping skills to deal with her psychosocial concerns. Offered emotional support and reassurance. Patient doesfeel she is making progress toward Pulmonary Rehab goals. Patient reports her health and activity level haveimproved in the past 30 days as evidenced by patient's report of changedability to cope with stressful situations.  Patient reports feeling positive about current and projected progression in Pulmonary Rehab. She has attended 21 sessions. After reviewing the patient's treatment plan, the patient ismaking progress toward Pulmonary Rehab goals. [name] rate of progress toward rehab goals is fair. Plan of action to help patient continue to work towards rehab goals include continuing to attend weekly sessions. Will continue to monitor and evaluate progress toward psychosocial goal(s).      Continued Psychosocial Services Needed Yes Yes         Education: Education Goals: Education classes will be provided on a weekly basis, covering required topics. Participant will state understanding/return demonstration of topics presented.  Learning Barriers/Preferences:     Learning Barriers/Preferences - 05/02/16 1102      Learning Barriers/Preferences   Learning Barriers None   Learning Preferences Written Material;Video;Group Instruction      Education Topics: How Lungs Work and Diseases: - Discuss the anatomy of the lungs and diseases that can affect the lungs, such as COPD.   Exercise: -Discuss the importance of exercise, FITT principles of exercise, normal and abnormal responses to  exercise, and how to exercise safely.   Environmental Irritants: -Discuss types of environmental irritants and how to limit exposure to environmental irritants.   Meds/Inhalers and oxygen: - Discuss respiratory medications, definition of an inhaler and oxygen, and the proper way to use an inhaler and oxygen.   Energy Saving Techniques: - Discuss methods to conserve energy and decrease shortness of breath when performing activities of daily living.    Bronchial Hygiene / Breathing Techniques: - Discuss breathing mechanics, pursed-lip breathing technique,  proper posture, effective ways to clear airways, and other functional  breathing techniques Flowsheet Row PULMONARY REHAB CHRONIC OBSTRUCTIVE PULMONARY DISEASE from 07/02/2016 in [place]  Date  05/09/16  Educator  Nils Flack  Instruction Review Code  2- meets goals/outcomes      Cleaning Equipment: - Provides group verbal and written instruction about the health risks of elevated stress, cause of high stress, and healthy ways to reduce stress.   Nutrition I: Fats: - Discuss the types of cholesterol, what cholesterol does to the body, and how cholesterol levels can be controlled. Flowsheet Row PULMONARY REHAB CHRONIC OBSTRUCTIVE PULMONARY DISEASE from 07/02/2016 in [place]  Date  05/16/16  Educator  Russella Dar  Instruction Review Code  2- meets goals/outcomes      Nutrition II: Labels: -Discuss the different components of food labels and how to read food labels. Flowsheet Row PULMONARY REHAB CHRONIC OBSTRUCTIVE PULMONARY DISEASE from 07/02/2016 in [place]  Date  06/25/16  Educator  DC  Instruction Review Code  2- meets goals/outcomes      Respiratory Infections: - Discuss the signs and symptoms of respiratory infections, ways to prevent respiratory infections, and the importance of seeking medical treatment when having a respiratory  infection. Flowsheet Row PULMONARY REHAB CHRONIC OBSTRUCTIVE PULMONARY DISEASE from 07/02/2016 in [place]  Date  06/06/16  Educator  Lisabeth Register  Instruction Review Code  2- meets goals/outcomes      Stress I: Signs and Symptoms: - Discuss the causes of stress, how stress may lead to anxiety and depression, and ways to limit stress. Flowsheet Row PULMONARY REHAB CHRONIC OBSTRUCTIVE PULMONARY DISEASE from 07/02/2016 in [place]  Date  06/13/16  Educator  [place]  Instruction Review Code  2- meets goals/outcomes      Stress II: Relaxation: -Discuss relaxation techniques to limit stress.   Oxygen for Home/Travel: - Discuss how to prepare for travel when on oxygen and proper ways to transport and store oxygen to ensure safety.   Knowledge Questionnaire Score:     Knowledge Questionnaire Score - 05/02/16 1104      Knowledge Questionnaire Score   Pre Score 6/14      Core Components/Risk Factors/Patient Goals at Admission:     Personal Goals and Risk Factors at Admission - 05/02/16 1241      Core Components/Risk Factors/Patient Goals on Admission    Weight Management Yes;Obesity   Intervention Weight Management: Develop a combined nutrition and exercise program designed to reach desired caloric intake, while maintaining appropriate intake of nutrient and fiber, sodium and fats, and appropriate energy expenditure required for the weight goal.   Increase Strength and Stamina Yes   Intervention Provide advice, education, support and counseling about physical activity/exercise needs.;Develop an individualized exercise prescription for aerobic and resistive training based on initial evaluation findings, risk stratification, comorbidities and participant's personal goals.   Expected Outcomes Achievement of increased cardiorespiratory fitness and enhanced flexibility, muscular endurance and strength shown through measurements of functional  capacity and personal statement of participant.   Improve shortness of breath with ADL's Yes   Intervention Provide education, individualized exercise plan and daily activity instruction to help decrease symptoms of SOB with activities of daily living.   Expected Outcomes Short Term: Achieves a reduction of symptoms when performing activities of daily living.   Develop more efficient breathing techniques such as purse lipped breathing and diaphragmatic breathing; and practicing self-pacing with activity Yes   Intervention Provide education, demonstration and support about specific breathing techniuqes utilized for  more efficient breathing. Include techniques such as pursed lipped breathing, diaphragmatic breathing and self-pacing activity.   Expected Outcomes Short Term: Participant will be able to demonstrate and use breathing techniques as needed throughout daily activities.   Increase knowledge of respiratory medications and ability to use respiratory devices properly  Yes   Intervention Provide education and demonstration as needed of appropriate use of medications, inhalers, and oxygen therapy.   Expected Outcomes Short Term: Achieves understanding of medications use. Understands that oxygen is a medication prescribed by physician. Demonstrates appropriate use of inhaler and oxygen therapy.   Intervention Provide education about proper nutrition, including hydration, and aerobic/resistive exercise prescription along with prescribed medications to achieve blood glucose in normal ranges: Fasting glucose 65-99 mg/dL   Expected Outcomes Long Term: Attainment of HbA1C < 7%.   Personal Goal Other Yes   Personal Goal Get stronger and stay stronger. Do her regular ADL's without SOB.    Intervention Patient will exercise 2 days/week in pulmonary rehab and supplement at home with exercise 3 days/week.    Expected Outcomes Patient will meet her personal goals.       Core Components/Risk Factors/Patient  Goals Review:      Goals and Risk Factor Review    Row Name 05/02/16 1244 05/16/16 1305 06/12/16 1430 07/11/16 0901       Core Components/Risk Factors/Patient Goals Review   Personal Goals Review Weight Management/Obesity;Increase Strength and Stamina;Improve shortness of breath with ADL's Weight Management/Obesity;Increase Strength and Stamina;Improve shortness of breath with ADL's;Develop more efficient breathing techniques such as purse lipped breathing and diaphragmatic breathing and practicing self-pacing with activity.;Diabetes  Get stronger and stay stronger. Do her regular ADL's without SOB.  Weight Management/Obesity;Sedentary;Increase Strength and Stamina;Develop more efficient breathing techniques such as purse lipped breathing and diaphragmatic breathing and practicing self-pacing with activity.;Improve shortness of breath with ADL's;Diabetes  Get stronger and stay stronger. Do her regular ADL's without SOB.  Weight Management/Obesity;Increase Strength and Stamina;Diabetes;Improve shortness of breath with ADL's;Develop more efficient breathing techniques such as purse lipped breathing and diaphragmatic breathing and practicing self-pacing with activity.  Get stronger; stay stronger; do more without SOB.    Review  - Patient new to program. She has had 5 sessions. Will continue to monitor for progress.  Patient has attended 14 sessions. She has lost 7.7 lbs since starting the program. She is progressing well in the program with improved SOB and increased strength and stamina.  Patient has attended 21 sessions. Patient has gained 5.7 lbs. She has progressed well in the program with increased strength and stamina and some improvement in SOB. She continue to use O2 continuously. Will continue to monitor for progress.     Expected Outcomes  - Patient will continue to attend sessions meeting her personal goals.  Patient will continue to attend sessions completing the program meeting her personal  goals.  Patient will complete the program meeting her personal goals.        Core Components/Risk Factors/Patient Goals at Discharge (Final Review):      Goals and Risk Factor Review - 07/11/16 0901      Core Components/Risk Factors/Patient Goals Review   Personal Goals Review Weight Management/Obesity;Increase Strength and Stamina;Diabetes;Improve shortness of breath with ADL's;Develop more efficient breathing techniques such as purse lipped breathing and diaphragmatic breathing and practicing self-pacing with activity.  Get stronger; stay stronger; do more without SOB.   Review Patient has attended 21 sessions. Patient has gained 5.7 lbs. She has progressed well in the program  with increased strength and stamina and some improvement in SOB. She continue to use O2 continuously. Will continue to monitor for progress.    Expected Outcomes Patient will complete the program meeting her personal goals.       ITP Comments:     ITP Comments    Row Name 05/09/16 1256           ITP Comments Patient met with Registered Dietitian to discuss nutrition topics including: Heart healthty eating, heart health cooking and make smart choices when shopping; Portion control; weight management; and hydration. Patient attended a group session with the hospital chaplian called [facility] to discuss and share how her recent pulmonary diagnosis has effected her life.          Comments: ITP 30 Day REVIEW Pt is making expected progress toward pulmonary rehab goals after completing 21 sessions. Recommend continued exercise, life style modification, education, and utilization of breathing techniques to increase stamina and strength and decrease shortness of breath with exertion.

## 2016-07-16 ENCOUNTER — Encounter (HOSPITAL_COMMUNITY): Admission: RE | Admit: 2016-07-16 | Payer: Medicare Other | Source: Ambulatory Visit

## 2016-07-17 ENCOUNTER — Ambulatory Visit (INDEPENDENT_AMBULATORY_CARE_PROVIDER_SITE_OTHER): Payer: Medicare Other | Admitting: Obstetrics and Gynecology

## 2016-07-17 ENCOUNTER — Encounter: Payer: Self-pay | Admitting: Obstetrics and Gynecology

## 2016-07-17 ENCOUNTER — Other Ambulatory Visit (HOSPITAL_COMMUNITY)
Admission: RE | Admit: 2016-07-17 | Discharge: 2016-07-17 | Disposition: A | Discharge status: Home / Self Care | Payer: Medicare Other | Source: Ambulatory Visit | Attending: Obstetrics and Gynecology | Admitting: Obstetrics and Gynecology

## 2016-07-17 VITALS — BP 128/74 | HR 82 | Wt 200.0 lb

## 2016-07-17 DIAGNOSIS — Z01419 Encounter for gynecological examination (general) (routine) without abnormal findings: Secondary | ICD-10-CM | POA: Insufficient documentation

## 2016-07-17 DIAGNOSIS — Z1212 Encounter for screening for malignant neoplasm of rectum: Secondary | ICD-10-CM

## 2016-07-17 DIAGNOSIS — Z1151 Encounter for screening for human papillomavirus (HPV): Secondary | ICD-10-CM | POA: Insufficient documentation

## 2016-07-17 DIAGNOSIS — Z124 Encounter for screening for malignant neoplasm of cervix: Secondary | ICD-10-CM | POA: Diagnosis not present

## 2016-07-17 NOTE — Progress Notes (Signed)
Assessment:  Annual Gyn Exam   Plan:  1. pap smear done, next pap due in  2 years 2. return annually or prn 3    Annual mammogram advised Subjective:  Denise Freeman is a 66 y.o. female No obstetric history on file. who presents for annual exam. No LMP recorded. Patient is postmenopausal. The patient has no acute physical complaints at this time. She notes she had a pap smear 1 year ago but that "it was water" which may mean the sample was inadequate.   The following portions of the patient's history were reviewed and updated as appropriate: allergies, current medications, past family history, past medical history, past social history, past surgical history and problem list. Past Medical History:  Diagnosis Date   Anemia    Anxiety    Asthma    Atrial fibrillation ([place])    Bipolar 1 disorder ([place])    Blind    "partially in both eyes" (03/14/2016)   CHF (congestive heart failure) (H[facility])    Chronic back pain    Chronic bronchitis ([facility])    Colon polyps    Complication of anesthesia    postop nausea/vomiting   COPD (chronic obstructive pulmonary disease) ([place])    Coronary artery disease    Depression    Family history of adverse reaction to anesthesia    Uncle was positive for malignant hyperthermia; patient had testing done and was negative.   Fibromyalgia    GERD (gastroesophageal reflux disease)    Gout    High cholesterol    History of blood transfusion 06/2015   "bleeding from my rectum"   History of hiatal hernia    HOH (hard of hearing)    Hx of colonic polyps 03/21/2016   3 small adenomas no recall - co-morbidities   Myocardial infarction 1990   "mild"   Neuropathy ([place])    Disc Back    On home oxygen therapy    "3L; 24/7" (03/14/2016)   OSA treated with BiPAP    uses biPAP, 10 (03/14/2016)   Osteoarthritis    Pneumonia    Shortness of breath dyspnea    Type II diabetes mellitus ([place])     Past Surgical History:   Procedure Laterality Date   APPENDECTOMY     "they busted"   bladder stimulator     pt states, "it cannot be turned off; it's in my right hip; dead battery so it's not working anymore". (03/14/2016)   BLADDER SUSPENSION     2003, 2006 and 2010   CATARACT EXTRACTION W/PHACO Right 11/29/2014   Procedure: CATARACT EXTRACTION PHACO AND INTRAOCULAR LENS PLACEMENT (IOC);  Surgeon: Rutherford Guys, MD;  Location: [facility];  Service: Ophthalmology;  Laterality: Right;  CDE:3.81   CATARACT EXTRACTION W/PHACO Left 12/13/2014   Procedure: CATARACT EXTRACTION PHACO AND INTRAOCULAR LENS PLACEMENT (IOC);  Surgeon: Rutherford Guys, MD;  Location: [facility];  Service: Ophthalmology;  Laterality: Left;  CDE:6.59   CERVICAL DISC SURGERY N/A 2009   4, 6, and 7 cervical disc replaced   COLONOSCOPY WITH PROPOFOL N/A 03/15/2016   Procedure: COLONOSCOPY WITH PROPOFOL;  Surgeon: Gatha Mayer, MD;  Location: [facility];  Service: Endoscopy;  Laterality: N/A;   ESOPHAGOGASTRODUODENOSCOPY (EGD) WITH PROPOFOL N/A 03/15/2016   Procedure: ESOPHAGOGASTRODUODENOSCOPY (EGD) WITH PROPOFOL;  Surgeon: Gatha Mayer, MD;  Location: [facility] [facility];  Service: Endoscopy;  Laterality: N/A;   HEEL SPUR SURGERY Bilateral    HERNIA REPAIR     I&D EXTREMITY Right 06/13/2015  Procedure: MINOR IRRIGATION AND DEBRIDEMENT EXTREMITY REMOVAL OF NAIL;  Surgeon: Daryll Brod, MD;  Location: [facility];  Service: Orthopedics;  Laterality: Right;   TUBAL LIGATION     UMBILICAL HERNIA REPAIR     w/mesh     Current Outpatient Prescriptions:    aspirin 81 MG chewable tablet, Chew 81 mg by mouth daily., Disp: , Rfl:    atorvastatin (LIPITOR) 40 MG tablet, Take 1 tablet (40 mg total) by mouth daily., Disp: 90 tablet, Rfl: 2   budesonide (PULMICORT) 0.5 MG/2ML nebulizer solution, USE 2 ML (0.5 MG TOTAL) VIA NEBULIZER TWICE A DAY, Disp: 360 mL, Rfl: 3   cetirizine (ZYRTEC) 10 MG tablet, Take 10 mg by mouth at bedtime., Disp:  , Rfl:    Cholecalciferol (VITAMIN D3) 400 units CAPS, Take 800 Units by mouth daily., Disp: 180 capsule, Rfl: 2   diltiazem (CARDIZEM) 30 MG tablet, Take 1 tablet (30 mg total) by mouth 2 (two) times daily., Disp: 180 tablet, Rfl: 3   docusate sodium (COLACE) 100 MG capsule, Take 100 mg by mouth daily., Disp: , Rfl:    escitalopram (LEXAPRO) 10 MG tablet, Take 10 mg by mouth at bedtime. , Disp: , Rfl:    fenofibrate 54 MG tablet, Take 1 tablet (54 mg total) by mouth daily., Disp: 90 tablet, Rfl: 3   ferrous sulfate 324 (65 Fe) MG TBEC, Take 1 tablet by mouth daily., Disp: , Rfl:    fluticasone (FLONASE) 50 MCG/ACT nasal spray, Place 1 spray into both nostrils daily as needed for allergies or rhinitis., Disp: 32 g, Rfl: 6   furosemide (LASIX) 40 MG tablet, Take 2 tablets (80 mg total) by mouth 2 (two) times daily. (Patient taking differently: Take 60 mg by mouth 2 (two) times daily. ), Disp: 360 tablet, Rfl: 3   Insulin Glargine (TOUJEO SOLOSTAR) 300 UNIT/ML SOPN, Inject 40-50 Units into the skin 2 times daily at 12 noon and 4 pm., Disp: 30 pen, Rfl: 1   insulin lispro (HUMALOG KWIKPEN) 100 UNIT/ML KiwkPen, Inject 25-35 units under skin 3x a day (Patient taking differently: Inject 26-28 Units into the skin See admin instructions. Inject 26 units subque in the morning, 28 units subque before lunch and 28 units subque before dinner), Disp: 35 pen, Rfl: 1   Insulin Pen Needle 32G X 4 MM MISC, Use to inject insulin 4 times daily., Disp: 400 each, Rfl: 3   ipratropium-albuterol (DUONEB) 0.5-2.5 (3) MG/3ML SOLN, Take 3 mLs by nebulization 4 (four) times daily., Disp: 1080 mL, Rfl: 1   metFORMIN (GLUCOPHAGE-XR) 500 MG 24 hr tablet, Take 1 tablet (500 mg total) by mouth 2 (two) times daily., Disp: 180 tablet, Rfl: 1   metolazone (ZAROXOLYN) 2.5 MG tablet, Take 1 tablet (2.5 mg total) by mouth daily., Disp: 90 tablet, Rfl: 3   mirtazapine (REMERON) 15 MG tablet, Take 15 mg by mouth at bedtime.,  Disp: , Rfl:    modafinil (PROVIGIL) 200 MG tablet, Take 1 tablet (200 mg total) by mouth daily., Disp: 90 tablet, Rfl: 0   montelukast (SINGULAIR) 10 MG tablet, Take 1 tablet (10 mg total) by mouth at bedtime., Disp: 90 tablet, Rfl: 1   mupirocin cream (BACTROBAN) 2 %, Apply 1 application topically 2 (two) times daily., Disp: 15 g, Rfl: 0   nitroGLYCERIN (NITROSTAT) 0.4 MG SL tablet, Place 1 tablet (0.4 mg total) under the tongue every 5 (five) minutes as needed for chest pain., Disp: 25 tablet, Rfl: 3  nystatin (NYSTATIN) powder, Apply topically 2 (two) times daily. Apply under breasts, Disp: 15 g, Rfl: 3   ondansetron (ZOFRAN-ODT) 4 MG disintegrating tablet, Take 1 tablet (4 mg total) by mouth 2 (two) times daily as needed for nausea or vomiting., Disp: 30 tablet, Rfl: 1   OXYGEN, Inhale 3 L into the lungs continuous. 3 liters 24/7, Disp: , Rfl:    pantoprazole (PROTONIX) 20 MG tablet, Take 1 tablet (20 mg total) by mouth 2 (two) times daily before a meal., Disp: 180 tablet, Rfl: 1   potassium chloride SA (K-DUR,KLOR-CON) 20 MEQ tablet, Take 1 tablet (20 mEq total) by mouth 3 (three) times daily., Disp: 270 tablet, Rfl: 1   PRESCRIPTION MEDICATION, Inhale into the lungs See admin instructions. Use BIPAP every time laying down, Disp: , Rfl:    primidone (MYSOLINE) 50 MG tablet, Take 1 tablet (50 mg total) by mouth at bedtime., Disp: 90 tablet, Rfl: 1   Probiotic Product (PROBIOTIC PO), Take 1 tablet by mouth at bedtime., Disp: , Rfl:    triamcinolone lotion (KENALOG) 0.1 %, Apply 1 application topically 3 (three) times daily. For up to 14 days and as needed, Disp: 60 mL, Rfl: 1   VITAMIN E PO, Take 1 tablet by mouth daily., Disp: , Rfl:    Vitamins A & D (VITAMIN A & D PO), Take by mouth., Disp: , Rfl:   Review of Systems Otherwise negative for acute change except as noted in the HPI.  Objective:  BP 128/74 (BP Location: Left Arm, Patient Position: Sitting, Cuff Size: Normal)     Pulse 82    Wt 200 lb (90.7 kg)    SpO2 98%    BMI 40.40 kg/m    BMI: Body mass index is 40.4 kg/m.  General Appearance: Alert, appropriate appearance for age. No acute distress HEENT: Grossly normal Neck / Thyroid:  Cardiovascular: RRR; normal S1, S2, no murmur Lungs: CTA bilaterally Back: No CVAT Breast Exam: No dimpling, nipple retraction or discharge. No masses or nodes. and No masses or nodes.No dimpling, nipple retraction or discharge. Gastrointestinal: Soft, non-tender, no masses or organomegaly Pelvic Exam:  VULVA: normal appearing vulva with no masses, tenderness or lesions,  VAGINA: atrophic  CERVIX: normal appearing cervix without discharge or lesions,  UTERUS:  well supported, nontender, mobile, anterior  ADNEXA: negative   PAP: Pap smear done today. Rectovaginal: normal rectal, no masses. Hemoccult negative  Lymphatic Exam: Non-palpable nodes in neck, clavicular, axillary, or inguinal regions  Skin: no rash or abnormalities Neurologic: Normal gait and speech, no tremor  Psychiatric: Alert and oriented, appropriate affect.  Urinalysis:Not done  By signing my name below, I, Evelene Croon, attest that this documentation has been prepared under the direction and in the presence of Jonnie Kind, MD . Electronically Signed: Evelene Croon, Scribe. 07/17/2016. 11:16 AM. I personally performed the services described in this documentation, which was SCRIBED in my presence. The recorded information has been reviewed and considered accurate. It has been edited as necessary during review. Jonnie Kind, MD

## 2016-07-18 ENCOUNTER — Encounter: Payer: Self-pay | Admitting: Family Medicine

## 2016-07-18 ENCOUNTER — Encounter (HOSPITAL_COMMUNITY): Payer: Medicare Other

## 2016-07-18 ENCOUNTER — Ambulatory Visit (INDEPENDENT_AMBULATORY_CARE_PROVIDER_SITE_OTHER): Payer: Medicare Other | Admitting: Family Medicine

## 2016-07-18 VITALS — BP 108/62 | HR 72 | Temp 98.2°F | Wt 198.0 lb

## 2016-07-18 DIAGNOSIS — R0989 Other specified symptoms and signs involving the circulatory and respiratory systems: Secondary | ICD-10-CM | POA: Diagnosis not present

## 2016-07-18 DIAGNOSIS — R0609 Other forms of dyspnea: Secondary | ICD-10-CM

## 2016-07-18 DIAGNOSIS — R0600 Dyspnea, unspecified: Secondary | ICD-10-CM

## 2016-07-18 LAB — CBC WITH DIFFERENTIAL/PLATELET
BASOS ABS: 0 {cells}/uL (ref 0–200)
Basophils Relative: 0 %
EOS PCT: 3 %
Eosinophils Absolute: 258 cells/uL (ref 15–500)
HCT: 32 % — ABNORMAL LOW (ref 35.0–45.0)
HEMOGLOBIN: 10.2 g/dL — AB (ref 11.7–15.5)
LYMPHS ABS: 1376 {cells}/uL (ref 850–3900)
Lymphocytes Relative: 16 %
MCH: 27.8 pg (ref 27.0–33.0)
MCHC: 31.9 g/dL — AB (ref 32.0–36.0)
MCV: 87.2 fL (ref 80.0–100.0)
MPV: 10.6 fL (ref 7.5–12.5)
Monocytes Absolute: 344 cells/uL (ref 200–950)
Monocytes Relative: 4 %
NEUTROS PCT: 77 %
Neutro Abs: 6622 cells/uL (ref 1500–7800)
Platelets: 261 10*3/uL (ref 140–400)
RBC: 3.67 MIL/uL — ABNORMAL LOW (ref 3.80–5.10)
RDW: 13.2 % (ref 11.0–15.0)
WBC: 8.6 10*3/uL (ref 3.8–10.8)

## 2016-07-18 LAB — BASIC METABOLIC PANEL WITH GFR
BUN: 26 mg/dL — AB (ref 7–25)
CALCIUM: 8.9 mg/dL (ref 8.6–10.4)
CO2: 34 mmol/L — ABNORMAL HIGH (ref 20–31)
CREATININE: 1.15 mg/dL — AB (ref 0.50–0.99)
Chloride: 92 mmol/L — ABNORMAL LOW (ref 98–110)
GFR, EST AFRICAN AMERICAN: 58 mL/min — AB (ref >=60)
GFR, Est Non African American: 50 mL/min — ABNORMAL LOW (ref >=60)
Glucose, Bld: 229 mg/dL — ABNORMAL HIGH (ref 65–99)
Potassium: 4.3 mmol/L (ref 3.5–5.3)
Sodium: 139 mmol/L (ref 135–146)

## 2016-07-18 LAB — CYTOLOGY - PAP
DIAGNOSIS: NEGATIVE
HPV (WINDOPATH): NOT DETECTED

## 2016-07-18 MED ORDER — GUAIFENESIN ER 600 MG PO TB12: 600.0000 mg | ORAL_TABLET | Freq: Two times a day (BID) | ORAL | 0 refills | Status: DC | PRN

## 2016-07-18 NOTE — Patient Instructions (Signed)
I have sent in PLAIN Mucinex for your congestion.  I will contact you will the results of your labs.  If anything is abnormal, I will call you.  Otherwise, expect a copy to be mailed to you.  Make sure that you are weighing yourself daily and taking your breathing treatments as directed.  If your symptoms get worse, you develop fevers, or you start to be very tired, seek IMMEDIATE medical attention.

## 2016-07-18 NOTE — Progress Notes (Signed)
Subjective: CC: Cough EPP:IRJJO Denise Freeman is a 66 y.o. female presenting to clinic today for same day appointment. PCP: Darci Needle, MD Concerns today include:  1. Cough Patient reports a chronic cough.  She notes she has been having dyspnea on exertion over the last week.  She thinks maybe she gained about 3 pounds, but she is not sure what her baseline weight is because she lost >50 pounds since last year.  She denies fevers, nausea, vomiting.  She reports several sick contacts in the grocery store.  She denies increased LE swelling.  She endorses occ diarrhea.  Cough is nonproductive but she reports she feels like she has chest congestion.  She notes rhinorrhea which is chronic.  She reports compliance with her Flonase and Zyrtec.  She has been compliant with her COPD meds and CHF meds.  No CP, SOB at rest.  She is on constant 3L O2 via [place] at baseline.  Allergies  Allergen Reactions  . Xarelto [Rivaroxaban] Other (See Comments)    Internal bleeding  . Ancef [Cefazolin] Nausea And Vomiting  . Levaquin [Levofloxacin In D5w] Other (See Comments)    "afib"  . Lyrica [Pregabalin] Hives  . Tamiflu [Oseltamivir Phosphate] Other (See Comments)    "water blisters"  . Zoloft [Sertraline Hcl] Other (See Comments)    Jaw problems, jittery  . Augmentin [Amoxicillin-Pot Clavulanate] Itching  . Ciprofloxacin Itching  . Haldol [Haloperidol] Other (See Comments)    Restless leg  . Nsaids Diarrhea  . Penicillins Itching, Nausea And Vomiting and Rash    Has patient had a PCN reaction causing immediate rash, facial/tongue/throat swelling, SOB or lightheadedness with hypotension: Yes Has patient had a PCN reaction causing severe rash involving mucus membranes or skin necrosis: No Has patient had a PCN reaction that required hospitalization No Has patient had a PCN reaction occurring within the last 10 years: Yes If all of the above answers are "NO", then may proceed with Cephalosporin  use.  . Topamax [Topiramate] Nausea Only    Social Hx reviewed. MedHx, current medications and allergies reviewed.  Please see EMR. ROS: Per HPI  Objective: Office vital signs reviewed. BP 108/62   Pulse 72   Temp 98.2 F (36.8 C) (Oral)   Wt 198 lb (89.8 kg) Comment: unable to weigh  SpO2 98%   BMI 39.99 kg/m   Physical Examination:  General: Awake, alert, elderly female, overweight, nontoxic appearing, No acute distress HEENT: Normal    Neck: No masses palpated. No lymphadenopathy    Eyes: wears glasses    Nose: nasal turbinates moist, clear nasal discharge    Throat: moist mucus membranes Cardio: irregularly irregular, no murmurs appreciated Pulm: clear to auscultation bilaterally w/ prolonged expiratory phase noted. no wheezes, rhonchi or rales; normal work of breathing on 3L 02 via  Extremities: warm, dry skin, trace LE edema to ankles  Assessment/ Plan: 66 y.o. female   1. Dyspnea on exertion.  No overt infectious symptoms.  She is afebrile here with normal VS.  She has severe COPD requiring O2, mildly reduced EF 45-50%, and CKD.  Multiple reasons for fluid overload/ DOE.  However, I have a low suspicion for COPD exacerbation.  No overt fluid overload on exam.  From prior exams it appears she has trace edema at baseline.  Baseline weight unknown so difficult to tell if true weight gain.  Her last weight was 200# at GYN appt yesterday but 189 at 1/5 and 1/25 appts, so CHF exacerbation  is a possibility.  Her lung exam did not reveal crackles and she had good air movement.  O2 saturation normal.  - Continue current COPD and CHF regimens for now.   - BASIC METABOLIC PANEL WITH GFR - Brain natriuretic peptide - CBC with Differential/Platelet - Will contact with results - Return precautions reviewed  2. Chest congestion - Mucinex 600mg  BID prn  Follow up prn  Janora Norlander, DO PGY-3, [facility]

## 2016-07-19 ENCOUNTER — Telehealth: Payer: Self-pay | Admitting: Pulmonary Disease

## 2016-07-19 ENCOUNTER — Telehealth: Payer: Self-pay | Admitting: Physician Assistant

## 2016-07-19 ENCOUNTER — Telehealth: Payer: Self-pay | Admitting: Family Medicine

## 2016-07-19 LAB — BRAIN NATRIURETIC PEPTIDE: Brain Natriuretic Peptide: 115.6 pg/mL — ABNORMAL HIGH (ref <100)

## 2016-07-19 NOTE — Telephone Encounter (Signed)
lmtcb x1 for pt. 

## 2016-07-19 NOTE — Telephone Encounter (Signed)
Message to [facility] CT. Will reschedule it for a couple of weeks out. BUN and Creatinine results from yesterday are in EPIC.

## 2016-07-19 NOTE — Telephone Encounter (Signed)
Pt was advised. ep °

## 2016-07-19 NOTE — Telephone Encounter (Signed)
BNP actually improved from previous. WBC normal. Hypokalemia improved. Cr stable. Follow up with PCP if persistent SOB.  Results for orders placed or performed in visit on 07/18/16 (from the past 72 hour(s))  BASIC METABOLIC PANEL WITH GFR     Status: Abnormal   Collection Time: 07/18/16 10:23 AM  Result Value Ref Range   Sodium 139 135 - 146 mmol/L   Potassium 4.3 3.5 - 5.3 mmol/L   Chloride 92 (L) 98 - 110 mmol/L   CO2 34 (H) 20 - 31 mmol/L   Glucose, Bld 229 (H) 65 - 99 mg/dL   BUN 26 (H) 7 - 25 mg/dL   Creat 1.15 (H) 0.50 - 0.99 mg/dL    Comment:   For patients > or = 66 years of age: The upper reference limit for Creatinine is approximately 13% higher for people identified as African-American.      Calcium 8.9 8.6 - 10.4 mg/dL   GFR, Est African American 58 (L) >=60 mL/min   GFR, Est Non African American 50 (L) >=60 mL/min  Brain natriuretic peptide     Status: Abnormal   Collection Time: 07/18/16 10:23 AM  Result Value Ref Range   Brain Natriuretic Peptide 115.6 (H) <100 pg/mL    Comment:   BNP levels increase with age in the general population with the highest values seen in individuals greater than 37 years of age. Reference: Joellyn Rued Cardiol 2002; 19:417-40.     CBC with Differential/Platelet     Status: Abnormal   Collection Time: 07/18/16 10:23 AM  Result Value Ref Range   WBC 8.6 3.8 - 10.8 K/uL   RBC 3.67 (L) 3.80 - 5.10 MIL/uL   Hemoglobin 10.2 (L) 11.7 - 15.5 g/dL   HCT 32.0 (L) 35.0 - 45.0 %   MCV 87.2 80.0 - 100.0 fL   MCH 27.8 27.0 - 33.0 pg   MCHC 31.9 (L) 32.0 - 36.0 g/dL   RDW 13.2 11.0 - 15.0 %   Platelets 261 140 - 400 K/uL   MPV 10.6 7.5 - 12.5 fL   Neutro Abs 6,622 1,500 - 7,800 cells/uL   Lymphs Abs 1,376 850 - 3,900 cells/uL   Monocytes Absolute 344 200 - 950 cells/uL   Eosinophils Absolute 258 15 - 500 cells/uL   Basophils Absolute 0 0 - 200 cells/uL   Neutrophils Relative % 77 %   Lymphocytes Relative 16 %   Monocytes Relative 4 %    Eosinophils Relative 3 %   Basophils Relative 0 %   Smear Review SEE NOTE     Comment: Criteria for review not met.   Ashlley Booher M. Lajuana Ripple, DO PGY-3, [facility]

## 2016-07-19 NOTE — Telephone Encounter (Signed)
The patient doesn't want to reschedule the CT at this point.

## 2016-07-22 ENCOUNTER — Telehealth: Payer: Self-pay | Admitting: *Deleted

## 2016-07-22 ENCOUNTER — Encounter: Payer: Self-pay | Admitting: Internal Medicine

## 2016-07-22 ENCOUNTER — Ambulatory Visit (INDEPENDENT_AMBULATORY_CARE_PROVIDER_SITE_OTHER): Payer: Medicare Other | Admitting: Internal Medicine

## 2016-07-22 VITALS — BP 124/74 | HR 91 | Wt 199.0 lb

## 2016-07-22 DIAGNOSIS — E1142 Type 2 diabetes mellitus with diabetic polyneuropathy: Secondary | ICD-10-CM | POA: Diagnosis not present

## 2016-07-22 DIAGNOSIS — E1165 Type 2 diabetes mellitus with hyperglycemia: Secondary | ICD-10-CM

## 2016-07-22 LAB — POCT GLYCOSYLATED HEMOGLOBIN (HGB A1C): Hemoglobin A1C: 7.5

## 2016-07-22 MED ORDER — INSULIN LISPRO 100 UNIT/ML (KWIKPEN): PEN_INJECTOR | SUBCUTANEOUS | 3 refills | Status: DC

## 2016-07-22 MED ORDER — INSULIN GLARGINE 300 UNIT/ML ~~LOC~~ SOPN: 40.0000 [IU] | PEN_INJECTOR | Freq: Two times a day (BID) | SUBCUTANEOUS | 3 refills | Status: DC

## 2016-07-22 NOTE — Progress Notes (Signed)
Patient ID: Denise Freeman, female   DOB: 1950/10/21, 66 y.o.   MRN: [id number]  HPI: Denise Freeman is a 66 y.o.-year-old female, initially referred by her PCP, Dr. Loman Chroman, for management of DM2, dx in 2009, insulin-dependent in 2013, uncontrolled, with complications (CKD, DR, PN). Last visit 5 mo ago.   She was admitted 03/14/16 with melena. She was on Xarelto then. Her EGD was normal, while the colonoscopy did not show a clear cause of bleeding - reviewed reports. Since then, she stopped Xarelto and is now on ASA 81.  She is now in pulmonary rehab for her COPD.   Last hemoglobin A1c was: Lab Results  Component Value Date   HGBA1C 8.9 (H) 03/14/2016   HGBA1C 9.0 02/28/2016   HGBA1C 7.8 11/28/2015  12/30/2013: HbA1c 8.6% 12/28/2013: HbA1c 7.9% HbA1c 8.7% HbA1c 9.4% HbA1c 7.3%  Pt is on a regimen of:  - Metformin ER 500 mg 2x a day - added 02/2016. Tolerates this well. - Toujeo 50 units in am and 40 units at bedtime - Humalog:  - 20 units before b'fast - 28 units before lunch - 28 units before dinner  - Sliding scale of Humalog - 150-175: + 1 unit  - 176-200: + 2 units  - 201-225: + 3 units  - 226-250: + 4 units  - 251-275: + 5 units - >275: + 6 units We stopped Trulicity 2.50 mg under skin weekly >> too expensive We stopped Januvia >> did not help She was on Metformin >> nausea.  Pt checks her sugars 3x a day - again forgot log: - am: 160s >> 149-157 >> 127-160 >> 150-180 (better lately) >> 160-170 >> 140-150, 160 - 2h after b'fast: n/c >> 34x1 >> n/c - before lunch:  195-210 >> 160-183 >> 134-170 >> 155 >> 145-149 >> 140s - 2h after lunch: n/c >> 160 >> n/c - before dinner:  175-187 >> 140-188 >> 165-170 >> 165 >> 140s  - 2h after dinner: n/c >> 180-190 >> n/c - bedtime: 180-209, 225 >> 163-188, 230 >> 190s >> 140 (drinks tomato juice) >> 177 - nighttime:n/c No lows. Lowest sugar was 34x1 >> 140 >> 140s; she has hypoglycemia awareness at 70 at  120. Highest sugar was 208 >> 200s. Glucometer: FreeStyle  Pt's meals are: - Breakfast: toast with spread or oatmeal with splenda - Lunch: cottage cheese, or fruit - Dinner: chicken/beef/turkey with vegetables and a starch; macaroni and cheese - Snacks: 2 snacks   - + CKD stage 3, last BUN/creatinine:  Lab Results  Component Value Date   BUN 26 (H) 07/18/2016   CREATININE 1.15 (H) 07/18/2016  On Lisinopril, fenofibrate. - last set of lipids: Lab Results  Component Value Date   CHOL 236 (H) 04/19/2016   HDL 37.70 (L) 04/19/2016   LDLDIRECT 153.0 04/19/2016   TRIG 258.0 (H) 04/19/2016   CHOLHDL 6 04/19/2016   09/07/2013: 180/135/31/138 On Crestor. - last eye exam was in 01/2016. 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 She had cataract sx in 05 and 11/2014. - no numbness and tingling in her feet.  ROS: Constitutional: + weight loss, + fatigue, + poor sleep, + nocturia Eyes: no blurry vision, no xerophthalmia ENT: no sore throat, no nodules palpated in throat, no dysphagia/odynophagia, no hoarseness, + hypoacusis Cardiovascular: no CP/+ SOB/no palpitations/+ leg swelling Respiratory: no cough/+ SOB/+ wheezing Gastrointestinal: no N/V/+ D (not new)/no C, + heartburn Musculoskeletal: + muscle aches/+ joint aches Skin: no rashes, + easy bruising Neurological: + tremors/no numbness/tingling/dizziness  I reviewed pt's medications, allergies, PMH, social hx, family hx, and changes were documented in the history of present illness. Otherwise, unchanged from my initial visit note:  Past Medical History:  Diagnosis Date  . Anemia   . Anxiety   . Asthma   . Atrial fibrillation ([place])   . Bipolar 1 disorder ([place])   . 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 CHF (congestive heart failure) ([place])   . Chronic back pain   . Chronic bronchitis ([place])   . Colon polyps   . Complication of anesthesia    postop nausea/vomiting  . COPD (chronic obstructive pulmonary disease) ([place])   . Coronary artery  disease   . Depression   . Family history of adverse reaction to anesthesia    Uncle was positive for malignant hyperthermia; patient had testing done and was negative.  . Fibromyalgia   . GERD (gastroesophageal reflux disease)   . Gout   . High cholesterol   . History of blood transfusion 06/2015   "bleeding from my rectum"  . History of hiatal hernia   . HOH (hard of hearing)   . Hx of colonic polyps 03/21/2016   3 small adenomas no recall - co-morbidities  . Myocardial infarction 1990   "mild"  . 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 On home oxygen therapy    "3L; 24/7" (03/14/2016)  . OSA treated with BiPAP    uses biPAP, 10 (03/14/2016)  . Osteoarthritis   . Pneumonia   . Shortness of breath dyspnea   . Type II diabetes mellitus ([place])    Past Surgical History:  Procedure Laterality Date  . APPENDECTOMY     "they busted"  . bladder stimulator     pt states, "it cannot be turned off; it's in my right hip; dead battery so it's not working anymore". (03/14/2016)  . BLADDER SUSPENSION     2003, 2006 and 2010  . CATARACT EXTRACTION W/PHACO Right 11/29/2014   Procedure: CATARACT EXTRACTION PHACO AND INTRAOCULAR LENS PLACEMENT (IOC);  Surgeon: Rutherford Guys, MD;  Location: [facility];  Service: Ophthalmology;  Laterality: Right;  CDE:3.81  . CATARACT EXTRACTION W/PHACO Left 12/13/2014   Procedure: CATARACT EXTRACTION PHACO AND INTRAOCULAR LENS PLACEMENT (IOC);  Surgeon: Rutherford Guys, MD;  Location: [facility];  Service: Ophthalmology;  Laterality: Left;  CDE:6.59  . CERVICAL DISC SURGERY N/A 2009   4, 6, and 7 cervical disc replaced  . COLONOSCOPY WITH PROPOFOL N/A 03/15/2016   Procedure: COLONOSCOPY WITH PROPOFOL;  Surgeon: Gatha Mayer, MD;  Location: [facility];  Service: Endoscopy;  Laterality: N/A;  . ESOPHAGOGASTRODUODENOSCOPY (EGD) WITH PROPOFOL N/A 03/15/2016   Procedure: ESOPHAGOGASTRODUODENOSCOPY (EGD) WITH PROPOFOL;  Surgeon: Gatha Mayer, MD;  Location: [facility];   Service: Endoscopy;  Laterality: N/A;  . HEEL SPUR SURGERY Bilateral   . HERNIA REPAIR    . I&D EXTREMITY Right 06/13/2015   Procedure: MINOR IRRIGATION AND DEBRIDEMENT EXTREMITY REMOVAL OF NAIL;  Surgeon: Daryll Brod, MD;  Location: [facility];  Service: Orthopedics;  Laterality: Right;  . TUBAL LIGATION    . UMBILICAL HERNIA REPAIR     w/mesh   History   Social History  . Marital Status: Married    Spouse Name: N/A    Number of Children: 1   Occupational History  . disabled   Social History Main Topics  . Smoking status: Former Smoker -- 2.00 packs/day for 14 years  Types: Cigarettes    Quit date: 04/12/1986  . Smokeless tobacco: Not on file  . Alcohol Use: No  . Drug Use: No   Current Outpatient Prescriptions on File Prior to Visit  Medication Sig Dispense Refill  . aspirin 81 MG chewable tablet Chew 81 mg by mouth daily.    Marland Kitchen atorvastatin (LIPITOR) 40 MG tablet Take 1 tablet (40 mg total) by mouth daily. 90 tablet 2  . budesonide (PULMICORT) 0.5 MG/2ML nebulizer solution USE 2 ML (0.5 MG TOTAL) VIA NEBULIZER TWICE A DAY 360 mL 3  . cetirizine (ZYRTEC) 10 MG tablet Take 10 mg by mouth at bedtime.    . Cholecalciferol (VITAMIN D3) 400 units CAPS Take 800 Units by mouth daily. 180 capsule 2  . diltiazem (CARDIZEM) 30 MG tablet Take 1 tablet (30 mg total) by mouth 2 (two) times daily. 180 tablet 3  . docusate sodium (COLACE) 100 MG capsule Take 100 mg by mouth daily.    Marland Kitchen escitalopram (LEXAPRO) 10 MG tablet Take 10 mg by mouth at bedtime.     . fenofibrate 54 MG tablet Take 1 tablet (54 mg total) by mouth daily. 90 tablet 3  . ferrous sulfate 324 (65 Fe) MG TBEC Take 1 tablet by mouth daily.    . fluticasone (FLONASE) 50 MCG/ACT nasal spray Place 1 spray into both nostrils daily as needed for allergies or rhinitis. 32 g 6  . furosemide (LASIX) 40 MG tablet Take 2 tablets (80 mg total) by mouth 2 (two) times daily. (Patient taking differently: Take 60 mg by mouth  2 (two) times daily. ) 360 tablet 3  . guaiFENesin (MUCINEX) 600 MG 12 hr tablet Take 1 tablet (600 mg total) by mouth 2 (two) times daily as needed. 30 tablet 0  . Insulin Glargine (TOUJEO SOLOSTAR) 300 UNIT/ML SOPN Inject 40-50 Units into the skin 2 times daily at 12 noon and 4 pm. 30 pen 1  . insulin lispro (HUMALOG KWIKPEN) 100 UNIT/ML KiwkPen Inject 25-35 units under skin 3x a day (Patient taking differently: Inject 26-28 Units into the skin See admin instructions. Inject 26 units subque in the morning, 28 units subque before lunch and 28 units subque before dinner) 35 pen 1  . Insulin Pen Needle 32G X 4 MM MISC Use to inject insulin 4 times daily. 400 each 3  . ipratropium-albuterol (DUONEB) 0.5-2.5 (3) MG/3ML SOLN Take 3 mLs by nebulization 4 (four) times daily. 1080 mL 1  . metFORMIN (GLUCOPHAGE-XR) 500 MG 24 hr tablet Take 1 tablet (500 mg total) by mouth 2 (two) times daily. 180 tablet 1  . metolazone (ZAROXOLYN) 2.5 MG tablet Take 1 tablet (2.5 mg total) by mouth daily. 90 tablet 3  . mirtazapine (REMERON) 15 MG tablet Take 15 mg by mouth at bedtime.    . modafinil (PROVIGIL) 200 MG tablet Take 1 tablet (200 mg total) by mouth daily. 90 tablet 0  . montelukast (SINGULAIR) 10 MG tablet Take 1 tablet (10 mg total) by mouth at bedtime. 90 tablet 1  . mupirocin cream (BACTROBAN) 2 % Apply 1 application topically 2 (two) times daily. 15 g 0  . nitroGLYCERIN (NITROSTAT) 0.4 MG SL tablet Place 1 tablet (0.4 mg total) under the tongue every 5 (five) minutes as needed for chest pain. 25 tablet 3  . nystatin (NYSTATIN) powder Apply topically 2 (two) times daily. Apply under breasts 15 g 3  . ondansetron (ZOFRAN-ODT) 4 MG disintegrating tablet Take 1 tablet (4 mg total) by mouth  2 (two) times daily as needed for nausea or vomiting. 30 tablet 1  . OXYGEN Inhale 3 L into the lungs continuous. 3 liters 24/7    . pantoprazole (PROTONIX) 20 MG tablet Take 1 tablet (20 mg total) by mouth 2 (two) times  daily before a meal. 180 tablet 1  . potassium chloride SA (K-DUR,KLOR-CON) 20 MEQ tablet Take 1 tablet (20 mEq total) by mouth 3 (three) times daily. 270 tablet 1  . PRESCRIPTION MEDICATION Inhale into the lungs See admin instructions. Use BIPAP every time laying down    . primidone (MYSOLINE) 50 MG tablet Take 1 tablet (50 mg total) by mouth at bedtime. 90 tablet 1  . Probiotic Product (PROBIOTIC PO) Take 1 tablet by mouth at bedtime.    . triamcinolone lotion (KENALOG) 0.1 % Apply 1 application topically 3 (three) times daily. For up to 14 days and as needed 60 mL 1  . VITAMIN E PO Take 1 tablet by mouth daily.    . Vitamins A & D (VITAMIN A & D PO) Take by mouth.     No current facility-administered medications on file prior to visit.    Allergies  Allergen Reactions  . Xarelto [Rivaroxaban] Other (See Comments)    Internal bleeding  . Ancef [Cefazolin] Nausea And Vomiting  . Levaquin [Levofloxacin In D5w] Other (See Comments)    "afib"  . Lyrica [Pregabalin] Hives  . Tamiflu [Oseltamivir Phosphate] Other (See Comments)    "water blisters"  . Zoloft [Sertraline Hcl] Other (See Comments)    Jaw problems, jittery  . Augmentin [Amoxicillin-Pot Clavulanate] Itching  . Ciprofloxacin Itching  . Haldol [Haloperidol] Other (See Comments)    Restless leg  . Nsaids Diarrhea  . Penicillins Itching, Nausea And Vomiting and Rash    Has patient had a PCN reaction causing immediate rash, facial/tongue/throat swelling, SOB or lightheadedness with hypotension: Yes Has patient had a PCN reaction causing severe rash involving mucus membranes or skin necrosis: No Has patient had a PCN reaction that required hospitalization No Has patient had a PCN reaction occurring within the last 10 years: Yes If all of the above answers are "NO", then may proceed with Cephalosporin use.  . Topamax [Topiramate] Nausea Only   Family History  Problem Relation Age of Onset  . Heart disease Mother   . COPD  Mother   . Diabetes Mother   . Breast cancer Mother   . Heart disease Father   . Hyperlipidemia Father   . COPD Sister   . Heart disease Sister   . Diabetes Sister   . Heart disease Maternal Grandmother    PE: BP 124/74 (BP Location: Left Arm, Patient Position: Sitting)   Pulse 91   Wt 199 lb (90.3 kg)   SpO2 98%   BMI 40.19 kg/m  Body mass index is 40.19 kg/m. Wt Readings from Last 3 Encounters:  07/22/16 199 lb (90.3 kg)  07/17/16 200 lb (90.7 kg)  06/27/16 189 lb (85.7 kg)   Constitutional: obese, in wheelchair, in NAD Eyes: PERRLA, EOMI, no exophthalmos ENT: moist mucous membranes, no thyromegaly, no cervical lymphadenopathy Cardiovascular: irreg. Irreg. , No MRG Respiratory: CTA B Gastrointestinal: abdomen soft, NT, ND, BS+ Musculoskeletal: no deformities, strength intact in all 4 Skin: moist, warm Neurological: + tremor with outstretched hands, DTR normal in all 4  ASSESSMENT: 1. DM2, insulin-dependent, uncontrolled, with complications - CKD stage 3 - DR - PN  PLAN:  1. Patient with long-standing, uncontrolled diabetes, on basal-bolus insulin  regimen, now with better sugars after adding a smaller Metformin ER dose, but not quite at goal. She tells me she eats as she is hungry before meals and after dinner, feels her sugars dropping then. Will decrease Toujeo in am and give her a more flexible Humalog regimen with lunch and dinner - lowering the dose for smaller meals. - I advised her to:  Patient Instructions  Please decrease: - Toujeo 40 units in am and 40 units at bedtime  Please decrease: - Humalog:  - 20 units before b'fast - 24 units before a smaller meal and 28 units before a larger meal with lunch and dinner  Continue:  - Sliding scale of Humalog - 150-175: + 1 unit  - 176-200: + 2 units  - 201-225: + 3 units  - 226-250: + 4 units  - 251-275: + 5 units - >275: + 6 units  Please return in 3 months with your sugar log.   - Strongly advised her  to continue checking sugars at different times of the day - check 3 times a day, rotating checks  - advised for yearly eye exams >> UTD - Check HbA1c today, and this is lower, at 7.5% - Return to clinic in 3 mo with sugar log   Philemon Kingdom, MD PhD [facility]

## 2016-07-22 NOTE — Patient Instructions (Addendum)
Please decrease: - Toujeo 40 units in am and 40 units at bedtime  Please decrease: - Humalog:  - 20 units before b'fast - 24 units before a smaller meal and 28 units before a larger meal with lunch and dinner  Continue:  - Sliding scale of Humalog - 150-175: + 1 unit  - 176-200: + 2 units  - 201-225: + 3 units  - 226-250: + 4 units  - 251-275: + 5 units - >275: + 6 units  Please return in 3 months with your sugar log.

## 2016-07-22 NOTE — Telephone Encounter (Signed)
Informed normal pap.

## 2016-07-22 NOTE — Addendum Note (Signed)
Addended by: Caprice Beaver T on: 07/22/2016 10:48 AM   Modules accepted: Orders

## 2016-07-22 NOTE — Telephone Encounter (Signed)
lmtcb X 2 

## 2016-07-23 ENCOUNTER — Encounter (HOSPITAL_COMMUNITY)
Admission: RE | Admit: 2016-07-23 | Discharge: 2016-07-23 | Disposition: A | Discharge status: Home / Self Care | Payer: Medicare Other | Source: Ambulatory Visit | Attending: Pulmonary Disease | Admitting: Pulmonary Disease

## 2016-07-23 DIAGNOSIS — J449 Chronic obstructive pulmonary disease, unspecified: Secondary | ICD-10-CM

## 2016-07-23 NOTE — Progress Notes (Signed)
Daily Session Note  Patient Details  Name: Denise Freeman MRN: [id number] Date of Birth: [birth year] Referring Provider:   Flowsheet Row PULMONARY REHAB COPD ORIENTATION from 05/02/2016 in [facility]  Referring Provider  Dr. Halford Chessman      Encounter Date: 07/23/2016  Check In:     Session Check In - 07/23/16 1330      Check-In   Location [facility]   Staff Present Diane Angelina Pih, MS, EP, [facility], Exercise Physiologist;Rechelle Niebla Luther Parody, BS, EP, Exercise Physiologist   Supervising physician immediately available to respond to emergencies See telemetry face sheet for immediately available MD   Medication changes reported     No   Fall or balance concerns reported    No   Warm-up and Cool-down Performed as group-led instruction   Resistance Training Performed Yes   VAD Patient? No     Pain Assessment   Currently in Pain? No/denies   Pain Score 0-No pain   Multiple Pain Sites No      Capillary Blood Glucose: No results found for this or any previous visit (from the past 24 hour(s)).   Goals Met:  Independence with exercise equipment Improved SOB with ADL's Using PLB without cueing & demonstrates good technique Exercise tolerated well No report of cardiac concerns or symptoms Strength training completed today  Goals Unmet:  Not Applicable  Comments: Check out 230   Dr. Sinda Du is Medical Director for [facility] Pulmonary Rehab.

## 2016-07-23 NOTE — Telephone Encounter (Signed)
lmtcb x1 for pt. 

## 2016-07-23 NOTE — Telephone Encounter (Signed)
Okay to send refill. 

## 2016-07-23 NOTE — Telephone Encounter (Signed)
Called and spoke with pt and she is requesting a refill be sent to [facility] for the modafinil for #90 with 1 refill.  VS please advise if ok to refill.  thanks

## 2016-07-24 MED ORDER — MODAFINIL 200 MG PO TABS: 200.0000 mg | ORAL_TABLET | Freq: Every day | ORAL | 1 refills | Status: DC

## 2016-07-24 NOTE — Telephone Encounter (Signed)
RX printed and will have VS sign this so it may be faxed to the mail order pharmacy per pts request.

## 2016-07-25 ENCOUNTER — Encounter (HOSPITAL_COMMUNITY): Payer: Medicare Other

## 2016-07-26 ENCOUNTER — Other Ambulatory Visit: Payer: Self-pay

## 2016-07-26 MED ORDER — INSULIN ASPART 100 UNIT/ML FLEXPEN: PEN_INJECTOR | SUBCUTANEOUS | 1 refills | Status: DC

## 2016-07-26 NOTE — Telephone Encounter (Signed)
Changed from humalog to novolog. Per.Dr.Gherghe.

## 2016-07-30 ENCOUNTER — Encounter (HOSPITAL_COMMUNITY)
Admission: RE | Admit: 2016-07-30 | Discharge: 2016-07-30 | Disposition: A | Discharge status: Home / Self Care | Payer: Medicare Other | Source: Ambulatory Visit | Attending: Pulmonary Disease | Admitting: Pulmonary Disease

## 2016-07-30 DIAGNOSIS — J449 Chronic obstructive pulmonary disease, unspecified: Secondary | ICD-10-CM

## 2016-07-30 NOTE — Progress Notes (Signed)
Daily Session Note  Patient Details  Name: Denise Freeman MRN: [id number] Date of Birth: 05/28/1951 Referring Provider:   Flowsheet Row PULMONARY REHAB COPD ORIENTATION from 05/02/2016 in [facility]  Referring Provider  Dr. Halford Chessman      Encounter Date: 07/30/2016  Check In:     Session Check In - 07/30/16 1345      Check-In   Location [facility]   Staff Present Aundra Dubin, RN, BSN;Yuya Vanwingerden Luther Parody, BS, EP, Exercise Physiologist   Supervising physician immediately available to respond to emergencies See telemetry face sheet for immediately available MD   Medication changes reported     No   Fall or balance concerns reported    No   Warm-up and Cool-down Performed as group-led instruction   Resistance Training Performed Yes   VAD Patient? No     Pain Assessment   Currently in Pain? No/denies   Pain Score 0-No pain   Multiple Pain Sites No      Capillary Blood Glucose: No results found for this or any previous visit (from the past 24 hour(s)).    History  Smoking Status  . Former Smoker  . Packs/day: 2.00  . Years: 14.00  . Types: Cigarettes  . Quit date: 04/12/1989  Smokeless Tobacco  . Never Used    Goals Met:  Independence with exercise equipment Improved SOB with ADL's Using PLB without cueing & demonstrates good technique Exercise tolerated well No report of cardiac concerns or symptoms Strength training completed today  Goals Unmet:  Not Applicable  Comments: Check out 230   Dr. Sinda Du is Medical Director for [facility] Pulmonary Rehab.

## 2016-07-31 NOTE — Telephone Encounter (Signed)
ashtyn please advise if this rx has been signed by VS and faxed. thanks

## 2016-07-31 NOTE — Telephone Encounter (Signed)
Rx has been signed and this has been faxed back to [phone] LM x 1 for patient

## 2016-08-01 ENCOUNTER — Encounter (HOSPITAL_COMMUNITY)
Admission: RE | Admit: 2016-08-01 | Discharge: 2016-08-01 | Disposition: A | Discharge status: Home / Self Care | Payer: Medicare Other | Source: Ambulatory Visit | Attending: Pulmonary Disease | Admitting: Pulmonary Disease

## 2016-08-01 ENCOUNTER — Other Ambulatory Visit: Payer: Self-pay

## 2016-08-01 ENCOUNTER — Telehealth: Payer: Self-pay | Admitting: Internal Medicine

## 2016-08-01 DIAGNOSIS — J449 Chronic obstructive pulmonary disease, unspecified: Secondary | ICD-10-CM | POA: Insufficient documentation

## 2016-08-01 MED ORDER — BASAGLAR KWIKPEN 100 UNIT/ML ~~LOC~~ SOPN: PEN_INJECTOR | SUBCUTANEOUS | 0 refills | Status: DC

## 2016-08-01 NOTE — Telephone Encounter (Signed)
OK, same dose

## 2016-08-01 NOTE — Progress Notes (Signed)
Denise Freeman presents to pulmonary rehab for her bi-weekly exercise session. I have completed her thirty day face to face review and determined that Denise Freeman is on track for meeting their pulmonary rehab goals. There are not barriers identified that will prevent them from continuing their exercise in pulmonary rehab as prescribed.   She can do more around the house and is finding the educational sessions helpful.

## 2016-08-01 NOTE — Telephone Encounter (Signed)
Can't afford the Toujeo changed to [employer] to    [employer] order 90 day supply

## 2016-08-01 NOTE — Telephone Encounter (Signed)
LM x 2 for pt 

## 2016-08-01 NOTE — Telephone Encounter (Signed)
rx submitted.  

## 2016-08-02 ENCOUNTER — Ambulatory Visit (INDEPENDENT_AMBULATORY_CARE_PROVIDER_SITE_OTHER): Payer: Medicare Other | Admitting: Internal Medicine

## 2016-08-02 ENCOUNTER — Encounter: Payer: Self-pay | Admitting: Internal Medicine

## 2016-08-02 ENCOUNTER — Telehealth: Payer: Self-pay | Admitting: Internal Medicine

## 2016-08-02 ENCOUNTER — Other Ambulatory Visit: Payer: Self-pay

## 2016-08-02 ENCOUNTER — Telehealth: Payer: Self-pay

## 2016-08-02 VITALS — BP 112/64 | HR 74 | Temp 98.8°F | Ht 59.0 in | Wt 195.6 lb

## 2016-08-02 DIAGNOSIS — E559 Vitamin D deficiency, unspecified: Secondary | ICD-10-CM | POA: Diagnosis not present

## 2016-08-02 DIAGNOSIS — Z794 Long term (current) use of insulin: Secondary | ICD-10-CM | POA: Diagnosis not present

## 2016-08-02 DIAGNOSIS — R251 Tremor, unspecified: Secondary | ICD-10-CM

## 2016-08-02 DIAGNOSIS — IMO0002 Reserved for concepts with insufficient information to code with codable children: Secondary | ICD-10-CM

## 2016-08-02 DIAGNOSIS — E876 Hypokalemia: Secondary | ICD-10-CM | POA: Diagnosis not present

## 2016-08-02 DIAGNOSIS — E1165 Type 2 diabetes mellitus with hyperglycemia: Secondary | ICD-10-CM

## 2016-08-02 DIAGNOSIS — K219 Gastro-esophageal reflux disease without esophagitis: Secondary | ICD-10-CM

## 2016-08-02 DIAGNOSIS — E785 Hyperlipidemia, unspecified: Secondary | ICD-10-CM

## 2016-08-02 DIAGNOSIS — E118 Type 2 diabetes mellitus with unspecified complications: Secondary | ICD-10-CM

## 2016-08-02 NOTE — Telephone Encounter (Signed)
Called and LVM advising to take the Humalog.

## 2016-08-02 NOTE — Telephone Encounter (Signed)
See telephone note.

## 2016-08-02 NOTE — Progress Notes (Signed)
[name] Progress Note  Subjective:  Denise Freeman is a 66 y.o. with intermittent afib, bipolar 1 disorder, COPD, rectal bleeding and fibromyalgia who presents for medication refills.   Medication refills: - Has had trouble receiving her insulin due to insurance problems and also did not have a functioning refrigerator until recently - Reports feeling "bad" last couple of days -- more tired  - Says blood sugars have been mostly in 200s - Has been eating and drinking okay - Received humalog from her Endocrinologist and was able to keep refrigerated in neighbor's fridge but does not have long-acting insulin. Endocrinologist sent basaglar Elinor Dodge 08/01/16, but patient uses mail order pharmacy. She is unsure when this medication will arrive. - Patient states she does now have a functioning refrigerato, obtained within last few days.  - Also requests refills on protonix, kdur, primidone, atorvastatin, vitamin D ROS: No abdominal pain, no n/v/d  Objective: Blood pressure 112/64, pulse 74, temperature 98.8 F (37.1 C), temperature source Oral, height 4\' 11"  (1.499 m), weight 195 lb 9.6 oz (88.7 kg), SpO2 99 %. Body mass index is 39.51 kg/m. Constitutional: Obese female, on supplemental O2 and on a scooter HENT: MMM Cardiovascular: RRR, S1, S2, no m/r/g.  Abdominal: Soft. +BS, NT, ND, no rebound or guarding.  Neurological: AOx3, no focal deficits. Vitals reviewed  Assessment/Plan: Diabetes ([place]) - Well-controlled on current regimen with recent hgb A1c of 7.5 but out of long-acting insulin - Provided a month's worth of lantus samples for patient to use while awaiting mail order prescriptions. Endocrinology normally orders patient's diabetes medications. Explained that [facility] does not always have samples but want to avoid her going into the weekend without medication.  - Reports sugars as in the 200s, in AOx3, not dehydrated on exam; do not suspect DKA  Provided other  refills of medication as requested.   Asked patient to discuss possibly restarting coumadin at her Cardiology visit next week given history of afib, as she refuses NOACs after bleeding episode.   Follow-up prn.  [name], MD [place], PGY-2

## 2016-08-02 NOTE — Telephone Encounter (Signed)
Pt came by and said that she was confused as to what insulin that she was supposed to be taking, she thought that it was the Humulog but she said that Novolog was sent in and she doesn't know what she is supposed to be taking at night. She said you may leave a message on her answering machine if she is not home.

## 2016-08-02 NOTE — Telephone Encounter (Signed)
Spoke with pt and made her aware that the rx was sent in. Nothing further is needed at this time. She had no further questions.

## 2016-08-02 NOTE — Telephone Encounter (Signed)
Called and LVM advising patient to take the humalog, as that is what Dr.Gherghe has that is what is in her note.

## 2016-08-02 NOTE — Patient Instructions (Addendum)
Ms. Dickison,  Please continue taking 40 units twice daily of now the lantus until the basaglar comes in. If you continue to have issues, please let me know.  I will send in the other refills you requested.  Please return to see me as needed.  Best, Dr. Ola Spurr

## 2016-08-03 MED ORDER — PANTOPRAZOLE SODIUM 20 MG PO TBEC: 20.0000 mg | DELAYED_RELEASE_TABLET | Freq: Two times a day (BID) | ORAL | 1 refills | Status: DC

## 2016-08-03 MED ORDER — INSULIN GLARGINE 100 UNIT/ML SOLOSTAR PEN: 40.0000 [IU] | PEN_INJECTOR | Freq: Two times a day (BID) | SUBCUTANEOUS | 1 refills | Status: DC

## 2016-08-03 MED ORDER — VITAMIN D3 10 MCG (400 UNIT) PO CAPS: 800.0000 [IU] | ORAL_CAPSULE | Freq: Every day | ORAL | 3 refills | Status: DC

## 2016-08-03 MED ORDER — ATORVASTATIN CALCIUM 40 MG PO TABS: 40.0000 mg | ORAL_TABLET | Freq: Every day | ORAL | 3 refills | Status: DC

## 2016-08-03 MED ORDER — PRIMIDONE 50 MG PO TABS: 50.0000 mg | ORAL_TABLET | Freq: Every day | ORAL | 3 refills | Status: DC

## 2016-08-03 MED ORDER — POTASSIUM CHLORIDE CRYS ER 20 MEQ PO TBCR: 20.0000 meq | EXTENDED_RELEASE_TABLET | Freq: Three times a day (TID) | ORAL | 1 refills | Status: DC

## 2016-08-03 NOTE — Assessment & Plan Note (Signed)
-   Well-controlled on current regimen with recent hgb A1c of 7.5 but out of long-acting insulin - Provided a month's worth of lantus samples for patient to use while awaiting mail order prescriptions. Endocrinology normally orders patient's diabetes medications. Explained that [facility] does not always have samples but want to avoid her going into the weekend without medication.  - Reports sugars as in the 200s, in AOx3, not dehydrated on exam; do not suspect DKA

## 2016-08-06 ENCOUNTER — Encounter (HOSPITAL_COMMUNITY): Admission: RE | Admit: 2016-08-06 | Payer: Medicare Other | Source: Ambulatory Visit

## 2016-08-06 ENCOUNTER — Telehealth: Payer: Self-pay | Admitting: Family Medicine

## 2016-08-06 DIAGNOSIS — G894 Chronic pain syndrome: Secondary | ICD-10-CM | POA: Diagnosis not present

## 2016-08-06 DIAGNOSIS — M797 Fibromyalgia: Secondary | ICD-10-CM | POA: Diagnosis not present

## 2016-08-06 DIAGNOSIS — M5032 Other cervical disc degeneration, mid-cervical region, unspecified level: Secondary | ICD-10-CM | POA: Diagnosis not present

## 2016-08-06 DIAGNOSIS — M5136 Other intervertebral disc degeneration, lumbar region: Secondary | ICD-10-CM | POA: Diagnosis not present

## 2016-08-06 NOTE — Telephone Encounter (Signed)
Patient called office to inquire about starting a new medication (muscle relaxant) from pain management. Patient was forwarded to me as preceptor. Unfortunately, the call was disconnected prior to finished our conversation.

## 2016-08-06 NOTE — Progress Notes (Signed)
Pulmonary Individual Treatment Plan  Patient Details  Name: Denise Freeman MRN: [id number] Date of Birth: Oct 25, 1950 Referring Provider:   Flowsheet Row PULMONARY REHAB COPD ORIENTATION from 05/02/2016 in [place]  Referring Provider  Dr. Halford Chessman      Initial Encounter Date:  Flowsheet Row PULMONARY REHAB COPD ORIENTATION from 05/02/2016 in [place]  Date  05/02/16  Referring Provider  Dr. 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  aspirin 81 MG chewable tablet, Chew 81 mg by mouth daily., Disp: , Rfl:  .  atorvastatin (LIPITOR) 40 MG tablet, Take 1 tablet (40 mg total) by mouth daily., Disp: 90 tablet, Rfl: 3 .  budesonide (PULMICORT) 0.5 MG/2ML nebulizer solution, USE 2 ML (0.5 MG TOTAL) VIA NEBULIZER TWICE A DAY, Disp: 360 mL, Rfl: 3 .  cetirizine (ZYRTEC) 10 MG tablet, Take 10 mg by mouth at bedtime., Disp: , Rfl:  .  Cholecalciferol (VITAMIN D3) 400 units CAPS, Take 800 Units by mouth daily., Disp: 180 capsule, Rfl: 3 .  diltiazem (CARDIZEM) 30 MG tablet, Take 1 tablet (30 mg total) by mouth 2 (two) times daily., Disp: 180 tablet, Rfl: 3 .  docusate sodium (COLACE) 100 MG capsule, Take 100 mg by mouth daily., Disp: , Rfl:  .  escitalopram (LEXAPRO) 10 MG tablet, Take 10 mg by mouth at bedtime. , Disp: , Rfl:  .  fenofibrate 54 MG tablet, Take 1 tablet (54 mg total) by mouth daily., Disp: 90 tablet, Rfl: 3 .  ferrous sulfate 324 (65 Fe) MG TBEC, Take 1 tablet by mouth daily., Disp: , Rfl:  .  fluticasone (FLONASE) 50 MCG/ACT nasal spray, Place 1 spray into both nostrils daily as needed for allergies or rhinitis., Disp: 32 g, Rfl: 6 .  furosemide (LASIX) 40 MG tablet, Take 2 tablets (80 mg total) by mouth 2 (two) times daily. (Patient taking differently: Take 60 mg by mouth 2 (two) times daily. ), Disp: 360 tablet, Rfl: 3 .  guaiFENesin  (MUCINEX) 600 MG 12 hr tablet, Take 1 tablet (600 mg total) by mouth 2 (two) times daily as needed., Disp: 30 tablet, Rfl: 0 .  Insulin Glargine (BASAGLAR KWIKPEN) 100 UNIT/ML SOPN, Inject 40 units into the skin 2 times daily, Disp: 25 pen, Rfl: 0 .  Insulin Glargine (LANTUS) 100 UNIT/ML Solostar Pen, Inject 40 Units into the skin 2 (two) times daily., Disp: 15 mL, Rfl: 1 .  insulin lispro (HUMALOG KWIKPEN) 100 UNIT/ML KiwkPen, Inject 24-32 units under skin 3x a day, Disp: 35 pen, Rfl: 3 .  Insulin Pen Needle 32G X 4 MM MISC, Use to inject insulin 4 times daily., Disp: 400 each, Rfl: 3 .  ipratropium-albuterol (DUONEB) 0.5-2.5 (3) MG/3ML SOLN, Take 3 mLs by nebulization 4 (four) times daily., Disp: 1080 mL, Rfl: 1 .  metFORMIN (GLUCOPHAGE-XR) 500 MG 24 hr tablet, Take 1 tablet (500 mg total) by mouth 2 (two) times daily., Disp: 180 tablet, Rfl: 1 .  metolazone (ZAROXOLYN) 2.5 MG tablet, Take 1 tablet (2.5 mg total) by mouth daily., Disp: 90 tablet, Rfl: 3 .  mirtazapine (REMERON) 15 MG tablet, Take 15 mg by mouth at bedtime., Disp: , Rfl:  .  modafinil (PROVIGIL) 200 MG tablet, Take 1 tablet (200 mg total) by mouth daily., Disp: 90 tablet, Rfl: 1 .  montelukast (SINGULAIR) 10 MG tablet, Take 1 tablet (10 mg total) by  mouth at bedtime., Disp: 90 tablet, Rfl: 1 .  mupirocin cream (BACTROBAN) 2 %, Apply 1 application topically 2 (two) times daily., Disp: 15 g, Rfl: 0 .  nitroGLYCERIN (NITROSTAT) 0.4 MG SL tablet, Place 1 tablet (0.4 mg total) under the tongue every 5 (five) minutes as needed for chest pain., Disp: 25 tablet, Rfl: 3 .  nystatin (NYSTATIN) powder, Apply topically 2 (two) times daily. Apply under breasts, Disp: 15 g, Rfl: 3 .  ondansetron (ZOFRAN-ODT) 4 MG disintegrating tablet, Take 1 tablet (4 mg total) by mouth 2 (two) times daily as needed for nausea or vomiting., Disp: 30 tablet, Rfl: 1 .  OXYGEN, Inhale 3 L into the lungs continuous. 3 liters 24/7, Disp: , Rfl:  .  pantoprazole  (PROTONIX) 20 MG tablet, Take 1 tablet (20 mg total) by mouth 2 (two) times daily before a meal., Disp: 180 tablet, Rfl: 1 .  potassium chloride SA (K-DUR,KLOR-CON) 20 MEQ tablet, Take 1 tablet (20 mEq total) by mouth 3 (three) times daily., Disp: 270 tablet, Rfl: 1 .  PRESCRIPTION MEDICATION, Inhale into the lungs See admin instructions. Use BIPAP every time laying down, Disp: , Rfl:  .  primidone (MYSOLINE) 50 MG tablet, Take 1 tablet (50 mg total) by mouth at bedtime., Disp: 90 tablet, Rfl: 3 .  Probiotic Product (PROBIOTIC PO), Take 1 tablet by mouth at bedtime., Disp: , Rfl:  .  triamcinolone lotion (KENALOG) 0.1 %, Apply 1 application topically 3 (three) times daily. For up to 14 days and as needed, Disp: 60 mL, Rfl: 1 .  VITAMIN E PO, Take 1 tablet by mouth daily., Disp: , Rfl:  .  Vitamins A & D (VITAMIN A & D PO), Take by mouth., Disp: , Rfl:   Past Medical History: Past Medical History:  Diagnosis Date  . Anemia   . Anxiety   . Asthma   . Atrial fibrillation ([place])   . Bipolar 1 disorder ([place])   . 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 CHF (congestive heart failure) ([place])   . Chronic back pain   . Chronic bronchitis ([place])   . Colon polyps   . Complication of anesthesia    postop nausea/vomiting  . COPD (chronic obstructive pulmonary disease) ([place])   . Coronary artery disease   . Depression   . Family history of adverse reaction to anesthesia    Uncle was positive for malignant hyperthermia; patient had testing done and was negative.  . Fibromyalgia   . GERD (gastroesophageal reflux disease)   . Gout   . High cholesterol   . History of blood transfusion 06/2015   "bleeding from my rectum"  . History of hiatal hernia   . HOH (hard of hearing)   . Hx of colonic polyps 03/21/2016   3 small adenomas no recall - co-morbidities  . Myocardial infarction 1990   "mild"  . 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 On home oxygen therapy    "3L; 24/7" (03/14/2016)  . OSA  treated with BiPAP    uses biPAP, 10 (03/14/2016)  . Osteoarthritis   . Pneumonia   . Shortness of breath dyspnea   . Type II diabetes mellitus (H[facility])     Tobacco Use: History  Smoking Status  . Former Smoker  . Packs/day: 2.00  . Years: 14.00  . Types: Cigarettes  . Quit date: 04/12/1989  Smokeless Tobacco  . Never Used    Labs: Recent Review Flowsheet  Data    Labs for ITP Cardiac and Pulmonary Rehab Latest Ref Rng & Units 11/28/2015 02/28/2016 03/14/2016 04/19/2016 07/22/2016   Cholestrol 0 - 200 mg/dL - - - 236(H) -   LDLDIRECT mg/dL - - - 153.0 -   HDL >39.00 mg/dL - - - 37.70(L) -   Trlycerides 0.0 - 149.0 mg/dL - - - 258.0(H) -   Hemoglobin A1c - 7.8 9.0 8.9(H) - 7.5      Capillary Blood Glucose: Lab Results  Component Value Date   GLUCAP 176 (H) 03/15/2016   GLUCAP 149 (H) 03/15/2016   GLUCAP 117 (H) 03/14/2016   GLUCAP 137 (H) 03/14/2016   GLUCAP 152 (H) 06/26/2015     ADL UCSD:     Pulmonary Assessment Scores    Row Name 05/02/16 1149         ADL UCSD   ADL Phase Entry     SOB Score total 62     Rest 2     Walk 11     Stairs 5     Bath 3     Dress 3       CAT Score   CAT Score 31       mMRC Score   mMRC Score 2        Pulmonary Function Assessment:     Pulmonary Function Assessment - 05/02/16 1120      Pulmonary Function Tests   FVC% 31 %   FEV1% 22 %   FEV1/FVC Ratio 70   RV% 1.88 %   DLCO% 19.55 %     Initial Spirometry Results   FVC% 35 %   FEV1% 30 %   FEV1/FVC Ratio 65   Comments Patient is lelgally blind and rides a skooter to get around.     Post Bronchodilator Spirometry Results   FVC% 35 %   FEV1% 30 %   FEV1/FVC Ratio 65     Breath   Bilateral Breath Sounds Decreased   Shortness of Breath Yes      Exercise Target Goals:    Exercise Program Goal: Individual exercise prescription set with THRR, safety & activity barriers. Participant demonstrates ability to understand and report RPE using BORG scale, to  self-measure pulse accurately, and to acknowledge the importance of the exercise prescription.  Exercise Prescription Goal: Starting with aerobic activity 30 plus minutes a day, 3 days per week for initial exercise prescription. Provide home exercise prescription and guidelines that participant acknowledges understanding prior to discharge.  Activity Barriers & Risk Stratification:     Activity Barriers & Cardiac Risk Stratification - 05/02/16 1101      Activity Barriers & Cardiac Risk Stratification   Activity Barriers Shortness of Breath;History of Falls;Assistive Device;Other (comment)   Comments Legally Blind   Cardiac Risk Stratification High      6 Minute Walk:   Oxygen Initial Assessment:   Oxygen Re-Evaluation:   Oxygen Discharge (Final Oxygen Re-Evaluation):   Initial Exercise Prescription:     Initial Exercise Prescription - 05/02/16 1100      Date of Initial Exercise RX and Referring Provider   Date 05/02/16   Referring Provider Dr. Halford Chessman     Oxygen   Oxygen Continuous   Liters 3     NuStep   Level 2   SPM 15   Minutes 15   METs 1.8     Arm Ergometer   Level 1.5   Watts 15   Minutes 20   METs 1.9  Prescription Details   Frequency (times per week) 3   Duration Progress to 30 minutes of continuous aerobic without signs/symptoms of physical distress     Intensity   THRR REST +  20   THRR 40-80% of Max Heartrate 113-127   Ratings of Perceived Exertion 11-13   Perceived Dyspnea 0-4     Progression   Progression Continue progressive overload as per policy without signs/symptoms or physical distress.     Resistance Training   Training Prescription Yes   Weight 1   Reps 10-12      Perform Capillary Blood Glucose checks as needed.  Exercise Prescription Changes:      Exercise Prescription Changes    Row Name 06/10/16 1400 07/09/16 1300 08/06/16 1500         Response to Exercise   Blood Pressure (Admit) 130/60 130/62 140/72      Blood Pressure (Exercise) 170/60 144/74 152/62     Blood Pressure (Exit) 122/52 138/72 130/76     Heart Rate (Admit) 83 bpm 90 bpm 81 bpm     Heart Rate (Exercise) 83 bpm 74 bpm 76 bpm     Heart Rate (Exit) 52 bpm 97 bpm 66 bpm     Oxygen Saturation (Admit) 98 % 92 % 93 %     Oxygen Saturation (Exercise) 92 % 93 % 95 %     Oxygen Saturation (Exit) 98 % 99 % 94 %     Rating of Perceived Exertion (Exercise) _0 Perceived Dyspnea (Exercise) _1 Duration Progress to 30 minutes of continuous aerobic without signs/symptoms of physical distress Progress to 30 minutes of continuous aerobic without signs/symptoms of physical distress Progress to 30 minutes of  aerobic without signs/symptoms of physical distress     Intensity Rest + 20 Rest + 20 THRR unchanged       Progression   Progression Continue progressive overload as per policy without signs/symptoms or physical distress. Continue progressive overload as per policy without signs/symptoms or physical distress. Continue to progress workloads to maintain intensity without signs/symptoms of physical distress.       Resistance Training   Training Prescription Yes Yes Yes     Weight _2 Reps 10-12 10-12 10-15       Oxygen   Oxygen Continuous Continuous Continuous     Liters _3 NuStep   Level _4 SPM _5 Minutes _6 METs 3.59 3.6 3.61       Arm Ergometer   Level 2._7 Watts 29 34 17     Minutes _8 METs 2.1 3.6 2.9       Home Exercise Plan   Plans to continue exercise at [facility] (comment)     Frequency Add 2 additional days to program exercise sessions. Add 2 additional days to program exercise sessions. Add 2 additional days to program exercise sessions.       Exercise Review   Progression Yes Yes No        Exercise Comments:      Exercise Comments    Row Name 05/09/16 1536 06/10/16 1404 07/09/16 1311 08/06/16 1528     Exercise Comments Patient has  just started pulmonary rehab and  will be proggressed appropriately  Patient is progressing well. Patient is progressing well Patient is doing well but however has not progressed much due to lack of attendance from doctors appointments and personal issues.        Exercise Goals and Review:   Exercise Goals Re-Evaluation :     Exercise Goals Re-Evaluation    Row Name 08/06/16 1643             Exercise Goal Re-Evaluation   Exercise Goals Review Increase Strenth and Stamina;Increase Physical Activity       Comments Patient is progressing well in the program with increased strength and stamina.        Expected Outcomes Patient will continue to progress in the program.           Discharge Exercise Prescription (Final Exercise Prescription Changes):     Exercise Prescription Changes - 08/06/16 1500      Response to Exercise   Blood Pressure (Admit) 140/72   Blood Pressure (Exercise) 152/62   Blood Pressure (Exit) 130/76   Heart Rate (Admit) 81 bpm   Heart Rate (Exercise) 76 bpm   Heart Rate (Exit) 66 bpm   Oxygen Saturation (Admit) 93 %   Oxygen Saturation (Exercise) 95 %   Oxygen Saturation (Exit) 94 %   Rating of Perceived Exertion (Exercise) 9   Perceived Dyspnea (Exercise) 9   Duration Progress to 30 minutes of  aerobic without signs/symptoms of physical distress   Intensity THRR unchanged     Progression   Progression Continue to progress workloads to maintain intensity without signs/symptoms of physical distress.     Resistance Training   Training Prescription Yes   Weight 3   Reps 10-15     Oxygen   Oxygen Continuous   Liters 3     NuStep   Level 3   SPM 22   Minutes 20   METs 3.61     Arm Ergometer   Level 3   Watts 17   Minutes 15   METs 2.9     Home Exercise Plan   Plans to continue exercise at Home (comment)   Frequency Add 2 additional days to program exercise sessions.     Exercise Review   Progression No      Nutrition:  Target  Goals: Understanding of nutrition guidelines, daily intake of sodium <1574m, cholesterol <2047m calories 30% from fat and 7% or less from saturated fats, daily to have 5 or more servings of fruits and vegetables.  Biometrics:     Pre Biometrics - 05/02/16 1141      Pre Biometrics   Height _0  (1.499 m)   Weight 194 lb 3.6 oz (88.1 kg)   Waist Circumference 46 inches   Hip Circumference 43 inches   Waist to Hip Ratio 1.07 %   BMI (Calculated) 39.3   Triceps Skinfold 7 mm   % Body Fat 43.1 %   Grip Strength 57.67 kg   Flexibility 0 in   Single Leg Stand 5 seconds       Nutrition Therapy Plan and Nutrition Goals:   Nutrition Discharge: Rate Your Plate Scores:     Nutrition Assessments - 05/02/16 1232      Rate Your Plate Scores   Pre Score 51      Nutrition Goals Re-Evaluation:   Nutrition Goals Discharge (Final Nutrition Goals Re-Evaluation):   Psychosocial: Target Goals: Acknowledge presence or absence of significant depression and/or stress, maximize coping skills, provide positive  support system. Participant is able to verbalize types and ability to use techniques and skills needed for reducing stress and depression.  Initial Review & Psychosocial Screening:     Initial Psych Review & Screening - 05/02/16 [place]? Yes     Barriers   Psychosocial barriers to participate in program There are no identifiable barriers or psychosocial needs.     Screening Interventions   Interventions Encouraged to exercise      Quality of Life Scores:     Quality of Life - 05/02/16 1143      Quality of Life Scores   Health/Function Pre 18.94 %   Socioeconomic Pre 19.92 %   Psych/Spiritual Pre 29.64 %   Family Pre 26.4 %   GLOBAL Pre 22.41 %      PHQ-9: Recent Review Flowsheet Data    Depression screen [facility] 2/9 07/18/2016 06/07/2016 05/02/2016 04/19/2016   Decreased Interest 0 0 0 0   Down, Depressed, Hopeless 0 0 1 0    PHQ - 2 Score 0 0 1 0   Altered sleeping - - 1 -   Tired, decreased energy - - 1 -   Change in appetite - - 1 -   Feeling bad or failure about yourself  - - 1 -   Trouble concentrating - - 0 -   Moving slowly or fidgety/restless - - 0 -   Suicidal thoughts - - 0 -   PHQ-9 Score - - 5 -   Difficult doing work/chores - - Somewhat difficult -     Interpretation of Total Score  Total Score Depression Severity:  1-4 = Minimal depression, 5-9 = Mild depression, 10-14 = Moderate depression, 15-19 = Moderately severe depression, 20-27 = Severe depression   Psychosocial Evaluation and Intervention:     Psychosocial Evaluation - 05/02/16 1245      Psychosocial Evaluation & Interventions   Interventions Encouraged to exercise with the program and follow exercise prescription   Comments Patient has no psychosocial barriers identified.    Continue Psychosocial Services  No      Psychosocial Re-Evaluation:     Psychosocial Re-Evaluation    [place] Name 05/16/16 1306 07/11/16 0847 08/06/16 1644         Psychosocial Re-Evaluation   Current issues with  -  - -  Bipolar disorder.      Comments Patient does have Bipolar disorder. [name] QOL score was 22.41 and her PHQ-9 score was 6. She is on an anti-anxiety medication. She has had one emotional outburst during a session. The team was able to calm her down.  90 Day Psychosocial Assessment: Patient psychosocial assessment continues to reveal no barriers to participation in Pulmonary Rehab. Psychosocial areas that continue to affect patient's rehab experience include concerns about healthissues and emotional problems as evidenced by Bipolar Disorder.Patient continues to exhibit some improvement in hercoping skills to deal with her psychosocial concerns. Offered emotional support and reassurance. Patient doesfeel she is making progress toward Pulmonary Rehab goals. Patient reports her health and activity level haveimproved in the past 30  days as evidenced by patient's report of changedability to cope with stressful situations.  Patient reports feeling positive about current and projected progression in Pulmonary Rehab. She has attended 21 sessions. After reviewing the patient's treatment plan, the patient ismaking progress toward Pulmonary Rehab goals. [name] rate of progress toward rehab goals is fair. Plan of action to help patient continue to work  towards rehab goals include continuing to attend weekly sessions. Will continue to monitor and evaluate progress toward psychosocial goal(s). 90 Day Psychosocial Assessment: Patient psychosocial assessment continues to reveal no barriers to participation in Pulmonary Rehab. Psychosocial areas that continue to affect patient's rehab experience include concerns about health issues and emotional problems as evidenced by Bipolar Disorder. Patient continues to exhibit some improvement in her coping skills to deal with her psychosocial concerns. Offered emotional support and reassurance.  Patient does feel she is making progress toward Pulmonary Rehab goals. Patient reports her health and activity level have improved in the past 30 days as evidenced by patient's report of changed ability to cope with stressful situations. Patient reports feeling positive about current and projected progression in Pulmonary Rehab. She has attended 18 sessions. After reviewing the patient's treatment plan, the patient is making progress toward Pulmonary Rehab goals. [name] rate of progress toward rehab goals is fair. Plan of action to help patient continue to work towards rehab goals include continuing to attend weekly sessions. Will continue to monitor and evaluate progress toward psychosocial goal(s).     Interventions Encouraged to attend Pulmonary Rehabilitation for the exercise;Stress management education Encouraged to attend Pulmonary Rehabilitation for the exercise;Stress management education;Relaxation  education Encouraged to attend Pulmonary Rehabilitation for the exercise;Relaxation education;Stress management education     Continue Psychosocial Services  Yes Yes Follow up required by staff        Psychosocial Discharge (Final Psychosocial Re-Evaluation):     Psychosocial Re-Evaluation - 08/06/16 1644      Psychosocial Re-Evaluation   Current issues with --  Bipolar disorder.    Comments 90 Day Psychosocial Assessment: Patient psychosocial assessment continues to reveal no barriers to participation in Pulmonary Rehab. Psychosocial areas that continue to affect patient's rehab experience include concerns about health issues and emotional problems as evidenced by Bipolar Disorder. Patient continues to exhibit some improvement in her coping skills to deal with her psychosocial concerns. Offered emotional support and reassurance.  Patient does feel she is making progress toward Pulmonary Rehab goals. Patient reports her health and activity level have improved in the past 30 days as evidenced by patient's report of changed ability to cope with stressful situations. Patient reports feeling positive about current and projected progression in Pulmonary Rehab. She has attended 18 sessions. After reviewing the patient's treatment plan, the patient is making progress toward Pulmonary Rehab goals. [name] rate of progress toward rehab goals is fair. Plan of action to help patient continue to work towards rehab goals include continuing to attend weekly sessions. Will continue to monitor and evaluate progress toward psychosocial goal(s).   Interventions Encouraged to attend Pulmonary Rehabilitation for the exercise;Relaxation education;Stress management education   Continue Psychosocial Services  Follow up required by staff       Education: Education Goals: Education classes will be provided on a weekly basis, covering required topics. Participant will state understanding/return demonstration of topics  presented.  Learning Barriers/Preferences:     Learning Barriers/Preferences - 05/02/16 1102      Learning Barriers/Preferences   Learning Barriers None   Learning Preferences Written Material;Video;Group Instruction      Education Topics: How Lungs Work and Diseases: - Discuss the anatomy of the lungs and diseases that can affect the lungs, such as COPD.   Exercise: -Discuss the importance of exercise, FITT principles of exercise, normal and abnormal responses to exercise, and how to exercise safely.   Environmental Irritants: -Discuss types of environmental irritants and how to limit  exposure to environmental irritants.   Meds/Inhalers and oxygen: - Discuss respiratory medications, definition of an inhaler and oxygen, and the proper way to use an inhaler and oxygen.   Energy Saving Techniques: - Discuss methods to conserve energy and decrease shortness of breath when performing activities of daily living.    Bronchial Hygiene / Breathing Techniques: - Discuss breathing mechanics, pursed-lip breathing technique,  proper posture, effective ways to clear airways, and other functional breathing techniques Flowsheet Row PULMONARY REHAB CHRONIC OBSTRUCTIVE PULMONARY DISEASE from 07/02/2016 in [place]  Date  05/09/16  Educator  Nils Flack  Instruction Review Code  2- meets goals/outcomes      Cleaning Equipment: - Provides group verbal and written instruction about the health risks of elevated stress, cause of high stress, and healthy ways to reduce stress.   Nutrition I: Fats: - Discuss the types of cholesterol, what cholesterol does to the body, and how cholesterol levels can be controlled. Flowsheet Row PULMONARY REHAB CHRONIC OBSTRUCTIVE PULMONARY DISEASE from 07/02/2016 in [place]  Date  05/16/16  Educator  Russella Dar  Instruction Review Code  2- meets goals/outcomes      Nutrition II: Labels: -Discuss the  different components of food labels and how to read food labels. Flowsheet Row PULMONARY REHAB CHRONIC OBSTRUCTIVE PULMONARY DISEASE from 07/02/2016 in [place]  Date  06/25/16  Educator  DC  Instruction Review Code  2- meets goals/outcomes      Respiratory Infections: - Discuss the signs and symptoms of respiratory infections, ways to prevent respiratory infections, and the importance of seeking medical treatment when having a respiratory infection. Flowsheet Row PULMONARY REHAB CHRONIC OBSTRUCTIVE PULMONARY DISEASE from 07/02/2016 in [place]  Date  06/06/16  Educator  Lisabeth Register  Instruction Review Code  2- meets goals/outcomes      Stress I: Signs and Symptoms: - Discuss the causes of stress, how stress may lead to anxiety and depression, and ways to limit stress. Flowsheet Row PULMONARY REHAB CHRONIC OBSTRUCTIVE PULMONARY DISEASE from 07/02/2016 in [place]  Date  06/13/16  Educator  [place]  Instruction Review Code  2- meets goals/outcomes      Stress II: Relaxation: -Discuss relaxation techniques to limit stress.   Oxygen for Home/Travel: - Discuss how to prepare for travel when on oxygen and proper ways to transport and store oxygen to ensure safety.   Knowledge Questionnaire Score:     Knowledge Questionnaire Score - 05/02/16 1104      Knowledge Questionnaire Score   Pre Score 6/14      Core Components/Risk Factors/Patient Goals at Admission:     Personal Goals and Risk Factors at Admission - 05/02/16 1241      Core Components/Risk Factors/Patient Goals on Admission    Weight Management Yes;Obesity   Intervention Weight Management: Develop a combined nutrition and exercise program designed to reach desired caloric intake, while maintaining appropriate intake of nutrient and fiber, sodium and fats, and appropriate energy expenditure required for the weight goal.   Increase Strength and  Stamina Yes   Intervention Provide advice, education, support and counseling about physical activity/exercise needs.;Develop an individualized exercise prescription for aerobic and resistive training based on initial evaluation findings, risk stratification, comorbidities and participant's personal goals.   Expected Outcomes Achievement of increased cardiorespiratory fitness and enhanced flexibility, muscular endurance and strength shown through measurements of functional capacity and personal statement of participant.   Improve shortness of breath with  ADL's Yes   Intervention Provide education, individualized exercise plan and daily activity instruction to help decrease symptoms of SOB with activities of daily living.   Expected Outcomes Short Term: Achieves a reduction of symptoms when performing activities of daily living.   Develop more efficient breathing techniques such as purse lipped breathing and diaphragmatic breathing; and practicing self-pacing with activity Yes   Intervention Provide education, demonstration and support about specific breathing techniuqes utilized for more efficient breathing. Include techniques such as pursed lipped breathing, diaphragmatic breathing and self-pacing activity.   Expected Outcomes Short Term: Participant will be able to demonstrate and use breathing techniques as needed throughout daily activities.   Increase knowledge of respiratory medications and ability to use respiratory devices properly  Yes   Intervention Provide education and demonstration as needed of appropriate use of medications, inhalers, and oxygen therapy.   Expected Outcomes Short Term: Achieves understanding of medications use. Understands that oxygen is a medication prescribed by physician. Demonstrates appropriate use of inhaler and oxygen therapy.   Intervention Provide education about proper nutrition, including hydration, and aerobic/resistive exercise prescription along with prescribed  medications to achieve blood glucose in normal ranges: Fasting glucose 65-99 mg/dL   Expected Outcomes Long Term: Attainment of HbA1C < 7%.   Personal Goal Other Yes   Personal Goal Get stronger and stay stronger. Do her regular ADL's without SOB.    Intervention Patient will exercise 2 days/week in pulmonary rehab and supplement at home with exercise 3 days/week.    Expected Outcomes Patient will meet her personal goals.       Core Components/Risk Factors/Patient Goals Review:      Goals and Risk Factor Review    Row Name 05/02/16 1244 05/16/16 1305 06/12/16 1430 07/11/16 0901 08/06/16 1642     Core Components/Risk Factors/Patient Goals Review   Personal Goals Review Weight Management/Obesity;Increase Strength and Stamina;Improve shortness of breath with ADL's Weight Management/Obesity;Increase Strength and Stamina;Improve shortness of breath with ADL's;Develop more efficient breathing techniques such as purse lipped breathing and diaphragmatic breathing and practicing self-pacing with activity.;Diabetes  Get stronger and stay stronger. Do her regular ADL's without SOB.  Weight Management/Obesity;Sedentary;Increase Strength and Stamina;Develop more efficient breathing techniques such as purse lipped breathing and diaphragmatic breathing and practicing self-pacing with activity.;Improve shortness of breath with ADL's;Diabetes  Get stronger and stay stronger. Do her regular ADL's without SOB.  Weight Management/Obesity;Increase Strength and Stamina;Diabetes;Improve shortness of breath with ADL's;Develop more efficient breathing techniques such as purse lipped breathing and diaphragmatic breathing and practicing self-pacing with activity.  Get stronger; stay stronger; do more without SOB. Weight Management/Obesity;Improve shortness of breath with ADL's;Develop more efficient breathing techniques such as purse lipped breathing and diaphragmatic breathing and practicing self-pacing with  activity.;Diabetes   Review  - Patient new to program. She has had 5 sessions. Will continue to monitor for progress.  Patient has attended 14 sessions. She has lost 7.7 lbs since starting the program. She is progressing well in the program with improved SOB and increased strength and stamina.  Patient has attended 21 sessions. Patient has gained 5.7 lbs. She has progressed well in the program with increased strength and stamina and some improvement in SOB. She continue to use O2 continuously. Will continue to monitor for progress.  Patient has completed 18 sessions. She has gained 4.7 lbs. Her SOB has improved. She has not attended in past week d/t MD appointments.    Expected Outcomes  - Patient will continue to attend sessions meeting her personal  goals.  Patient will continue to attend sessions completing the program meeting her personal goals.  Patient will complete the program meeting her personal goals.  Patient will continue to attend sessions.       Core Components/Risk Factors/Patient Goals at Discharge (Final Review):      Goals and Risk Factor Review - 08/06/16 1642      Core Components/Risk Factors/Patient Goals Review   Personal Goals Review Weight Management/Obesity;Improve shortness of breath with ADL's;Develop more efficient breathing techniques such as purse lipped breathing and diaphragmatic breathing and practicing self-pacing with activity.;Diabetes   Review Patient has completed 18 sessions. She has gained 4.7 lbs. Her SOB has improved. She has not attended in past week d/t MD appointments.    Expected Outcomes Patient will continue to attend sessions.       ITP Comments:     ITP Comments    Row Name 05/09/16 1256           ITP Comments Patient met with Registered Dietitian to discuss nutrition topics including: Heart healthty eating, heart health cooking and make smart choices when shopping; Portion control; weight management; and hydration. Patient attended a group  session with the hospital chaplian called [facility] to discuss and share how her recent pulmonary diagnosis has effected her life.          Comments: ITP 30 Day REVIEW Pt is making expected progress toward pulmonary rehab goals after completing 18 sessions. Recommend continued exercise, life style modification, education, and utilization of breathing techniques to increase stamina and strength and decrease shortness of breath with exertion.

## 2016-08-07 ENCOUNTER — Ambulatory Visit (INDEPENDENT_AMBULATORY_CARE_PROVIDER_SITE_OTHER): Payer: Medicare Other | Admitting: Internal Medicine

## 2016-08-07 VITALS — BP 108/60 | HR 85 | Ht 59.0 in | Wt 197.0 lb

## 2016-08-07 DIAGNOSIS — J438 Other emphysema: Secondary | ICD-10-CM | POA: Diagnosis not present

## 2016-08-07 DIAGNOSIS — E785 Hyperlipidemia, unspecified: Secondary | ICD-10-CM | POA: Diagnosis not present

## 2016-08-07 DIAGNOSIS — I4821 Permanent atrial fibrillation: Secondary | ICD-10-CM

## 2016-08-07 DIAGNOSIS — I5032 Chronic diastolic (congestive) heart failure: Secondary | ICD-10-CM

## 2016-08-07 DIAGNOSIS — I482 Chronic atrial fibrillation: Secondary | ICD-10-CM

## 2016-08-07 DIAGNOSIS — I480 Paroxysmal atrial fibrillation: Secondary | ICD-10-CM | POA: Diagnosis not present

## 2016-08-07 DIAGNOSIS — G4733 Obstructive sleep apnea (adult) (pediatric): Secondary | ICD-10-CM

## 2016-08-07 MED ORDER — METOLAZONE 2.5 MG PO TABS: 2.5000 mg | ORAL_TABLET | Freq: Every day | ORAL | 3 refills | Status: DC

## 2016-08-07 MED ORDER — FUROSEMIDE 40 MG PO TABS: 80.0000 mg | ORAL_TABLET | Freq: Two times a day (BID) | ORAL | 3 refills | Status: DC

## 2016-08-07 MED ORDER — DILTIAZEM HCL 30 MG PO TABS: 30.0000 mg | ORAL_TABLET | Freq: Two times a day (BID) | ORAL | 3 refills | Status: DC

## 2016-08-07 NOTE — Addendum Note (Signed)
Addended by: Fidel Levy on: 08/07/2016 11:03 AM   Modules accepted: Orders

## 2016-08-07 NOTE — Progress Notes (Signed)
OFFICE NOTE  Chief Complaint:  Short of breath  Primary Care Physician: Darci Needle, MD  HPI:  Denise Freeman is a pleasant 66 year old female who is establishing cardiac care today. She is accompanied by her husband and they recently moved here from [place], [place]. She has a history of severe COPD on home oxygen. She also has a history of stress-induced cardiomyopathy in the past. She has since had recovery of her EF. She's had numerous cardiac catheterizations. None of which showed obstructive coronary disease. Her last cardiac catheterization was in October 2014 which demonstrated no significant coronary disease. This was after a small abnormality was noted in the apex suggestive of ischemia on a nuclear stress test. She does have a history of permanent atrial fibrillation on Coumadin. She will need to have her INRs followed here. Unfortunate she has not had her INR checked in over 9 weeks and it was assessed today and was low. We will need to adjust her medication. She will be established in our anticoagulation clinic. Blood pressure looks well controlled today. She is on cholesterol medication.  I saw Denise Freeman back today in the office. Unfortunate she was hospitalized in December for worsening shortness of breath and probable pneumonia with a mild diastolic heart failure exacerbation. She was given IV diuretics and her Lasix was increased to 40 mg 3 times a day. She's also taking metolazone 3 times weekly. She continues to complain of leg edema which is mostly dependent. She has significant shortness of breath and CO2 retention and an appointment with a pulmonologist is pending.  Denise Freeman returned today back in the office. Her main complaints are a number of excoriations and weeping lesions on her face and arms. She apparently is going to see a dermatologist about this. She's also had worsening leg swelling and shortness of breath. Her weight is now up about 8 pounds  since her last visit. She was instructed to take Lasix 40 mg 3 times a day but apparently she is taking it twice a day. She is taking the metolazone 3 times a week. She is wearing compression stockings which I had advised and seems that this helps her swelling.  Denise Freeman returns today and reports a big improvement in her leg swelling. She's currently on Lasix 60 mg twice daily and metolazone 3x weekly. Weight is come down and swelling is improved. Her BNP was over 250 and is come down to about 125.  I saw Denise Freeman back today in follow-up. She's been successful losing over 15 pounds which I congratulated her on. Her breathing continues to be fairly difficult. She is using BiPAP at night. She's been in the ER a few times for mostly difficulty breathing and COPD exacerbation. She tells me the other day she was in church and had an episode of sharp chest pain in her left anterior chest and left scapular area. She was quite upset and emotional today time. Recently she's been the target of possibly a scam, so that she is under a lot of stress. She was upset that she did not have anything to take for her chest discomfort. I tried to reassure her that her coronary arteries have look normal with multiple catheterizations. She does have some diastolic dysfunction I suspect her LVEDP goes up when she gets upset causing her chest discomfort.  10/31/2015  Denise Freeman was seen today in follow-up from her recent hospitalization. She was seen in January for acute GI bleeding thought  to be upper GI bleeding and required transfusion for hemoglobin of 5. She was evaluated by Dr. Silvano Rusk with [facility] who recommended an EGD. Unfortunately, it looks like she was discharged the next day, possibly [place] after she removed her IV and stated she needed to go home. She relates that she never saw the specialist and does not recall seeing Dr. Carlean Purl. Since that time she's continued to have blood  in her stool. She was taken off of Xarelto and not restarted on anticoagulation. She does have a high CHADSVASC score of 5, suggesting a higher risk of stroke.  08/07/2016  Denise Freeman returns for follow-up. She underwent GI work-up with no clear bleeding source. She does not want to be on anticoagulation for fear of another stroke. She attributes her vision loss and word-finding difficulty to her prior stroke on Xarelto. She was previously on warfarin in the distant past, but reported having difficulty maintaining a therapeutic INR. We discussed the possibility of a Watchman device today - she seems interested in this. I explained the procedure and will refer her to Dr. Rayann Heman for evaluation.  PMHx:  Past Medical History:  Diagnosis Date  . Anemia   . Anxiety   . Asthma   . Atrial fibrillation ([place])   . Bipolar 1 disorder ([place])   . 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 CHF (congestive heart failure) ([place])   . Chronic back pain   . Chronic bronchitis ([place])   . Colon polyps   . Complication of anesthesia    postop nausea/vomiting  . COPD (chronic obstructive pulmonary disease) ([place])   . Coronary artery disease   . Depression   . Family history of adverse reaction to anesthesia    Uncle was positive for malignant hyperthermia; patient had testing done and was negative.  . Fibromyalgia   . GERD (gastroesophageal reflux disease)   . Gout   . High cholesterol   . History of blood transfusion 06/2015   "bleeding from my rectum"  . History of hiatal hernia   . HOH (hard of hearing)   . Hx of colonic polyps 03/21/2016   3 small adenomas no recall - co-morbidities  . Myocardial infarction 1990   "mild"  . 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 On home oxygen therapy    "3L; 24/7" (03/14/2016)  . OSA treated with BiPAP    uses biPAP, 10 (03/14/2016)  . Osteoarthritis   . Pneumonia   . Shortness of breath dyspnea   . Type II diabetes mellitus ([place])     Past Surgical  History:  Procedure Laterality Date  . APPENDECTOMY     "they busted"  . bladder stimulator     pt states, "it cannot be turned off; it's in my right hip; dead battery so it's not working anymore". (03/14/2016)  . BLADDER SUSPENSION     2003, 2006 and 2010  . CATARACT EXTRACTION W/PHACO Right 11/29/2014   Procedure: CATARACT EXTRACTION PHACO AND INTRAOCULAR LENS PLACEMENT (IOC);  Surgeon: Rutherford Guys, MD;  Location: [facility];  Service: Ophthalmology;  Laterality: Right;  CDE:3.81  . CATARACT EXTRACTION W/PHACO Left 12/13/2014   Procedure: CATARACT EXTRACTION PHACO AND INTRAOCULAR LENS PLACEMENT (IOC);  Surgeon: Rutherford Guys, MD;  Location: [facility];  Service: Ophthalmology;  Laterality: Left;  CDE:6.59  . CERVICAL DISC SURGERY N/A 2009   4, 6, and 7 cervical disc replaced  . COLONOSCOPY WITH PROPOFOL N/A 03/15/2016  Procedure: COLONOSCOPY WITH PROPOFOL;  Surgeon: Gatha Mayer, MD;  Location: [facility];  Service: Endoscopy;  Laterality: N/A;  . ESOPHAGOGASTRODUODENOSCOPY (EGD) WITH PROPOFOL N/A 03/15/2016   Procedure: ESOPHAGOGASTRODUODENOSCOPY (EGD) WITH PROPOFOL;  Surgeon: Gatha Mayer, MD;  Location: [facility];  Service: Endoscopy;  Laterality: N/A;  . HEEL SPUR SURGERY Bilateral   . HERNIA REPAIR    . I&D EXTREMITY Right 06/13/2015   Procedure: MINOR IRRIGATION AND DEBRIDEMENT EXTREMITY REMOVAL OF NAIL;  Surgeon: Daryll Brod, MD;  Location: [place];  Service: Orthopedics;  Laterality: Right;  . TUBAL LIGATION    . UMBILICAL HERNIA REPAIR     w/mesh    FAMHx:  Family History  Problem Relation Age of Onset  . Heart disease Mother   . COPD Mother   . Diabetes Mother   . Breast cancer Mother   . Heart disease Father   . Hyperlipidemia Father   . COPD Sister   . Heart disease Sister   . Diabetes Sister   . Heart disease Maternal Grandmother     SOCHx:   reports that she quit smoking about 27 years ago. Her smoking use included Cigarettes. She has a 28.00  pack-year smoking history. She has never used smokeless tobacco. She reports that she does not drink alcohol or use drugs.  ALLERGIES:  Allergies  Allergen Reactions  . Xarelto [Rivaroxaban] Other (See Comments)    Internal bleeding  . Ancef [Cefazolin] Nausea And Vomiting  . Levaquin [Levofloxacin In D5w] Other (See Comments)    "afib"  . Lyrica [Pregabalin] Hives  . Tamiflu [Oseltamivir Phosphate] Other (See Comments)    "water blisters"  . Zoloft [Sertraline Hcl] Other (See Comments)    Jaw problems, jittery  . Augmentin [Amoxicillin-Pot Clavulanate] Itching  . Ciprofloxacin Itching  . Haldol [Haloperidol] Other (See Comments)    Restless leg  . Nsaids Diarrhea  . Penicillins Itching, Nausea And Vomiting and Rash    Has patient had a PCN reaction causing immediate rash, facial/tongue/throat swelling, SOB or lightheadedness with hypotension: Yes Has patient had a PCN reaction causing severe rash involving mucus membranes or skin necrosis: No Has patient had a PCN reaction that required hospitalization No Has patient had a PCN reaction occurring within the last 10 years: Yes If all of the above answers are "NO", then may proceed with Cephalosporin use.  . Topamax [Topiramate] Nausea Only    ROS: Pertinent items noted in HPI and remainder of comprehensive ROS otherwise negative.  HOME MEDS: Current Outpatient Prescriptions  Medication Sig Dispense Refill  . aspirin 81 MG chewable tablet Chew 81 mg by mouth daily.    Marland Kitchen atorvastatin (LIPITOR) 40 MG tablet Take 1 tablet (40 mg total) by mouth daily. 90 tablet 3  . budesonide (PULMICORT) 0.5 MG/2ML nebulizer solution USE 2 ML (0.5 MG TOTAL) VIA NEBULIZER TWICE A DAY 360 mL 3  . cetirizine (ZYRTEC) 10 MG tablet Take 10 mg by mouth at bedtime.    . Cholecalciferol (VITAMIN D3) 400 units CAPS Take 800 Units by mouth daily. 180 capsule 3  . diltiazem (CARDIZEM) 30 MG tablet Take 1 tablet (30 mg total) by mouth 2 (two) times daily. 180  tablet 3  . docusate sodium (COLACE) 100 MG capsule Take 100 mg by mouth daily.    Marland Kitchen escitalopram (LEXAPRO) 10 MG tablet Take 10 mg by mouth at bedtime.     . fenofibrate 54 MG tablet Take 1 tablet (54 mg total) by mouth daily.  90 tablet 3  . ferrous sulfate 324 (65 Fe) MG TBEC Take 1 tablet by mouth daily.    . fluticasone (FLONASE) 50 MCG/ACT nasal spray Place 1 spray into both nostrils daily as needed for allergies or rhinitis. 32 g 6  . furosemide (LASIX) 40 MG tablet Take 2 tablets (80 mg total) by mouth 2 (two) times daily. 360 tablet 3  . guaiFENesin (MUCINEX) 600 MG 12 hr tablet Take 1 tablet (600 mg total) by mouth 2 (two) times daily as needed. 30 tablet 0  . Insulin Glargine (BASAGLAR KWIKPEN) 100 UNIT/ML SOPN Inject 40 units into the skin 2 times daily 25 pen 0  . Insulin Glargine (LANTUS) 100 UNIT/ML Solostar Pen Inject 40 Units into the skin 2 (two) times daily. 15 mL 1  . insulin lispro (HUMALOG KWIKPEN) 100 UNIT/ML KiwkPen Inject 24-32 units under skin 3x a day 35 pen 3  . Insulin Pen Needle 32G X 4 MM MISC Use to inject insulin 4 times daily. 400 each 3  . ipratropium-albuterol (DUONEB) 0.5-2.5 (3) MG/3ML SOLN Take 3 mLs by nebulization 4 (four) times daily. 1080 mL 1  . metFORMIN (GLUCOPHAGE-XR) 500 MG 24 hr tablet Take 1 tablet (500 mg total) by mouth 2 (two) times daily. 180 tablet 1  . metolazone (ZAROXOLYN) 2.5 MG tablet Take 1 tablet (2.5 mg total) by mouth daily. 90 tablet 3  . mirtazapine (REMERON) 15 MG tablet Take 15 mg by mouth at bedtime.    . modafinil (PROVIGIL) 200 MG tablet Take 1 tablet (200 mg total) by mouth daily. 90 tablet 1  . montelukast (SINGULAIR) 10 MG tablet Take 1 tablet (10 mg total) by mouth at bedtime. 90 tablet 1  . mupirocin cream (BACTROBAN) 2 % Apply 1 application topically 2 (two) times daily. 15 g 0  . nitroGLYCERIN (NITROSTAT) 0.4 MG SL tablet Place 1 tablet (0.4 mg total) under the tongue every 5 (five) minutes as needed for chest pain. 25  tablet 3  . nystatin (NYSTATIN) powder Apply topically 2 (two) times daily. Apply under breasts 15 g 3  . ondansetron (ZOFRAN-ODT) 4 MG disintegrating tablet Take 1 tablet (4 mg total) by mouth 2 (two) times daily as needed for nausea or vomiting. 30 tablet 1  . OXYGEN Inhale 3 L into the lungs continuous. 3 liters 24/7    . pantoprazole (PROTONIX) 20 MG tablet Take 1 tablet (20 mg total) by mouth 2 (two) times daily before a meal. 180 tablet 1  . potassium chloride SA (K-DUR,KLOR-CON) 20 MEQ tablet Take 1 tablet (20 mEq total) by mouth 3 (three) times daily. 270 tablet 1  . PRESCRIPTION MEDICATION Inhale into the lungs See admin instructions. Use BIPAP every time laying down    . primidone (MYSOLINE) 50 MG tablet Take 1 tablet (50 mg total) by mouth at bedtime. 90 tablet 3  . Probiotic Product (PROBIOTIC PO) Take 1 tablet by mouth at bedtime.    . triamcinolone lotion (KENALOG) 0.1 % Apply 1 application topically 3 (three) times daily. For up to 14 days and as needed 60 mL 1  . VITAMIN E PO Take 1 tablet by mouth daily.    . Vitamins A & D (VITAMIN A & D PO) Take by mouth.     No current facility-administered medications for this visit.     LABS/IMAGING: No results found for this or any previous visit (from the past 48 hour(s)). No results found.  VITALS: BP 108/60   Pulse 85  Ht 4\' 11"  (1.499 m)   Wt 197 lb (89.4 kg)   BMI 39.79 kg/m   EXAM: General appearance: alert, appears older than stated age, no distress and In a powered scooter Neck: no carotid bruit and no JVD Lungs: diminished breath sounds bilaterally and wheezes bilaterally Heart: irregularly irregular rhythm Abdomen: soft, non-tender; bowel sounds normal; no masses,  no organomegaly and obese Extremities: edema 1+ bilateral, stockings in place Pulses: 2+ and symmetric Skin: Numerous, weeping lesions Neurologic: Grossly normal Psych: Somewhat manic  EKG: Atrial fibrillation with Controlled ventricular response  at 85  ASSESSMENT: 1. Permanent atrial fibrillation - not on anticoagulation due to recent GI bleeding and vision loss attributed to stroke 2. Chronic diastolic congestive heart failure 3. History of stress-induced cardiomyopathy with normalization of her EF 4. No significant obstructive coronary disease after multiple catheterizations in 1996, 99, 2007 and 2014. 5. Fibromyalgia 6. Bipolar 1 disorder 7. Dyslipidemia 8. Hypertension 9. Severe COPD on home oxygen 10. Obstructive sleep apnea on CPAP 11. Morbid obesity - with weight loss recently 12. DNR  PLAN: 1.   Denise Freeman remains at high risk for stroke and has not been effectively anticoagulated for most of 2017. She will not take anticoagulation. Her CHADSVASC Score is 5. Seems to be compensated with diastolic CHF. Weight is down 13 lbs. Will refer to Dr. Rayann Heman for [facility] evaluation.   Pixie Casino, MD, [facility] Attending Cardiologist [place] 08/07/2016, 10:24 AM

## 2016-08-07 NOTE — Patient Instructions (Signed)
You have been referred to Dr. Rayann Heman - evaluation for Watchman device  Your physician recommends that you schedule a follow-up appointment in: [place] with [name]

## 2016-08-08 ENCOUNTER — Telehealth: Payer: Self-pay | Admitting: Internal Medicine

## 2016-08-08 ENCOUNTER — Encounter (HOSPITAL_COMMUNITY): Payer: Medicare Other

## 2016-08-08 NOTE — Telephone Encounter (Signed)
Attempted to call pt to inform her I am unable to determine who may have called her.  NA - did not leave a voicemail

## 2016-08-08 NOTE — Telephone Encounter (Signed)
Patient states that she has a missed call from our office and that she was not able to hear the voicemail. Thanks.

## 2016-08-08 NOTE — Telephone Encounter (Signed)
Close encounter 

## 2016-08-09 NOTE — Telephone Encounter (Signed)
Lm2cb all I see is EP Allred appt 08-23-16@230pm 

## 2016-08-13 ENCOUNTER — Encounter (HOSPITAL_COMMUNITY): Payer: Medicare Other

## 2016-08-15 ENCOUNTER — Encounter (HOSPITAL_COMMUNITY): Payer: Medicare Other

## 2016-08-16 ENCOUNTER — Other Ambulatory Visit: Payer: Self-pay

## 2016-08-16 MED ORDER — METFORMIN HCL ER 500 MG PO TB24: 500.0000 mg | ORAL_TABLET | Freq: Two times a day (BID) | ORAL | 2 refills | Status: DC

## 2016-08-20 ENCOUNTER — Encounter (HOSPITAL_COMMUNITY): Payer: Medicare Other

## 2016-08-21 ENCOUNTER — Telehealth: Payer: Self-pay

## 2016-08-21 NOTE — Telephone Encounter (Signed)
-----   Message from Janora Norlander, DO sent at 07/19/2016  1:23 PM EST ----- BNP actually improved from previous.  WBC normal.  Hypokalemia improved.  Cr stable.  Follow up with PCP if persistent SOB.

## 2016-08-21 NOTE — Telephone Encounter (Signed)
Spoke to pt, gave her the lab information. Told her to follow up with her PCP if SOB is persistent. Ottis Stain, CMA

## 2016-08-22 ENCOUNTER — Encounter (HOSPITAL_COMMUNITY): Payer: Medicare Other

## 2016-08-23 ENCOUNTER — Encounter: Payer: Self-pay | Admitting: Internal Medicine

## 2016-08-23 ENCOUNTER — Ambulatory Visit (INDEPENDENT_AMBULATORY_CARE_PROVIDER_SITE_OTHER): Payer: Medicare Other | Admitting: Internal Medicine

## 2016-08-23 VITALS — BP 100/60 | HR 95 | Ht 59.0 in | Wt 198.0 lb

## 2016-08-23 DIAGNOSIS — I482 Chronic atrial fibrillation: Secondary | ICD-10-CM

## 2016-08-23 DIAGNOSIS — I5032 Chronic diastolic (congestive) heart failure: Secondary | ICD-10-CM | POA: Diagnosis not present

## 2016-08-23 DIAGNOSIS — I4821 Permanent atrial fibrillation: Secondary | ICD-10-CM

## 2016-08-23 MED ORDER — WARFARIN SODIUM 2 MG PO TABS: 2.0000 mg | ORAL_TABLET | Freq: Every day | ORAL | 0 refills | Status: DC

## 2016-08-23 NOTE — Patient Instructions (Addendum)
Medication Instructions:   Your physician has recommended you make the following change in your medication:  1) STOP Aspirin 2) START Coumadin 2 mg once daily  --- If you need a refill on your cardiac medications before your next appointment, please call your pharmacy. ---  Labwork:  None ordered  Testing/Procedures:  None ordered  Follow-Up:  Your physician recommends that you schedule a follow-up appointment in: next week with Coumadin clinic in [place].   No follow up is needed at this time with Dr. Rayann Heman.  He will see you on an as needed basis.  Thank you for choosing [facility]!!

## 2016-08-23 NOTE — Progress Notes (Signed)
Watchman Consult Note   Date:  08/23/2016   ID:  Denise Freeman [birth year], MRN [id number]  PCP:  Darci Needle, MD  Cardiologist:  Debara Pickett Referring Physician: Debara Pickett   CC: to discuss Watchman implant    History of Present Illness: Denise Freeman is a 66 y.o. female who presents today for consult requested by Dr Debara Pickett for afib stroke prevention strategies.  She has permanent atrial fibrillation as well as O2 dependent COPD, morbid obesity, hypertension, prior stress induced cardiomyopathy, diabetes, and OSA.  The patient has been evaluated by their referring physician and is felt to be a poor candidate for long term [place] due to GI bleeding requiring transfusion. She has a fairly complicated history. She moved to [facility] from [place] in 2017.  She was managed on Warfarin for several years without complication and was started on Xarelto 2/2 labile INR's.  She subsequently developed GI bleeding in January of 2017 at which time Xarelto was discontinued. During that hospitalization, she left AMA prior to GI work up being done.  She subsequently underwent colonoscopy and endoscopy with no clear cause for bleeding identified. At that time, Dr Carlean Purl felt that she could resume [place].  She therefore presents today for Watchman evaluation.   Echo 05/2014 demonstrated EF 45-50%, LA 43.   She is not physically very active. She is mostly limited by shortness of breath despite home O2.  She has chronic fatigue and LE edema. Today, she denies symptoms of palpitations, chest pain, orthopnea, PND, claudication, presyncope, syncope, bleeding, or neurologic sequela. The patient is tolerating medications without difficulties and is otherwise without complaint today.    Past Medical History:  Diagnosis Date  . Anemia   . Anxiety   . Asthma   . Atrial fibrillation ([place])   . Bipolar 1 disorder ([place])   . 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 CHF (congestive heart failure) ([place])     . Chronic bronchitis ([place])   . Colon polyps   . COPD (chronic obstructive pulmonary disease) ([place])   . Depression   . Family history of adverse reaction to anesthesia    Uncle was positive for malignant hyperthermia; patient had testing done and was negative.  . Fibromyalgia   . GERD (gastroesophageal reflux disease)   . Gout   . High cholesterol   . History of blood transfusion 06/2015   "bleeding from my rectum"  . History of hiatal hernia   . HOH (hard of hearing)   . Hx of colonic polyps 03/21/2016   3 small adenomas no recall - co-morbidities  . 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 On home oxygen therapy    "3L; 24/7" (03/14/2016)  . OSA treated with BiPAP    uses biPAP, 10 (03/14/2016)  . Osteoarthritis   . Pneumonia   . Type II diabetes mellitus ([place])    Past Surgical History:  Procedure Laterality Date  . APPENDECTOMY     "they busted"  . bladder stimulator     pt states, "it cannot be turned off; it's in my right hip; dead battery so it's not working anymore". (03/14/2016)  . BLADDER SUSPENSION     2003, 2006 and 2010  . CATARACT EXTRACTION W/PHACO Right 11/29/2014   Procedure: CATARACT EXTRACTION PHACO AND INTRAOCULAR LENS PLACEMENT (IOC);  Surgeon: Rutherford Guys, MD;  Location: [facility];  Service: Ophthalmology;  Laterality: Right;  CDE:3.81  . CATARACT EXTRACTION W/PHACO Left 12/13/2014  Procedure: CATARACT EXTRACTION PHACO AND INTRAOCULAR LENS PLACEMENT (IOC);  Surgeon: Rutherford Guys, MD;  Location: [facility];  Service: Ophthalmology;  Laterality: Left;  CDE:6.59  . CERVICAL DISC SURGERY N/A 2009   4, 6, and 7 cervical disc replaced  . COLONOSCOPY WITH PROPOFOL N/A 03/15/2016   Procedure: COLONOSCOPY WITH PROPOFOL;  Surgeon: Gatha Mayer, MD;  Location: [facility];  Service: Endoscopy;  Laterality: N/A;  . ESOPHAGOGASTRODUODENOSCOPY (EGD) WITH PROPOFOL N/A 03/15/2016   Procedure: ESOPHAGOGASTRODUODENOSCOPY (EGD) WITH PROPOFOL;  Surgeon: Gatha Mayer, MD;   Location: [facility];  Service: Endoscopy;  Laterality: N/A;  . HEEL SPUR SURGERY Bilateral   . HERNIA REPAIR    . I&D EXTREMITY Right 06/13/2015   Procedure: MINOR IRRIGATION AND DEBRIDEMENT EXTREMITY REMOVAL OF NAIL;  Surgeon: Daryll Brod, MD;  Location: [facility];  Service: Orthopedics;  Laterality: Right;  . TUBAL LIGATION    . UMBILICAL HERNIA REPAIR     w/mesh     Current Outpatient Prescriptions  Medication Sig Dispense Refill  . aspirin 81 MG chewable tablet Chew 81 mg by mouth daily.    Marland Kitchen atorvastatin (LIPITOR) 40 MG tablet Take 1 tablet (40 mg total) by mouth daily. 90 tablet 3  . budesonide (PULMICORT) 0.5 MG/2ML nebulizer solution USE 2 ML (0.5 MG TOTAL) VIA NEBULIZER TWICE A DAY 360 mL 3  . cetirizine (ZYRTEC) 10 MG tablet Take 10 mg by mouth at bedtime.    . Cholecalciferol (VITAMIN D3) 400 units CAPS Take 800 Units by mouth daily. 180 capsule 3  . diltiazem (CARDIZEM) 30 MG tablet Take 1 tablet (30 mg total) by mouth 2 (two) times daily. 180 tablet 3  . docusate sodium (COLACE) 100 MG capsule Take 100 mg by mouth daily.    Marland Kitchen escitalopram (LEXAPRO) 10 MG tablet Take 10 mg by mouth at bedtime.     . fenofibrate 54 MG tablet Take 1 tablet (54 mg total) by mouth daily. 90 tablet 3  . ferrous sulfate 324 (65 Fe) MG TBEC Take 1 tablet by mouth daily.    . fluticasone (FLONASE) 50 MCG/ACT nasal spray Place 1 spray into both nostrils daily as needed for allergies or rhinitis. 32 g 6  . furosemide (LASIX) 40 MG tablet Take 2 tablets (80 mg total) by mouth 2 (two) times daily. 360 tablet 3  . Insulin Glargine (BASAGLAR KWIKPEN) 100 UNIT/ML SOPN Inject 40 units into the skin 2 times daily 25 pen 0  . insulin lispro (HUMALOG KWIKPEN) 100 UNIT/ML KiwkPen Inject 24-32 units under skin 3x a day 35 pen 3  . Insulin Pen Needle 32G X 4 MM MISC Use to inject insulin 4 times daily. 400 each 3  . ipratropium-albuterol (DUONEB) 0.5-2.5 (3) MG/3ML SOLN Take 3 mLs by nebulization 4  (four) times daily. 1080 mL 1  . metFORMIN (GLUCOPHAGE-XR) 500 MG 24 hr tablet Take 1 tablet (500 mg total) by mouth 2 (two) times daily. 180 tablet 2  . metolazone (ZAROXOLYN) 2.5 MG tablet Take 1 tablet (2.5 mg total) by mouth daily. 90 tablet 3  . mirtazapine (REMERON) 15 MG tablet Take 15 mg by mouth at bedtime.    . modafinil (PROVIGIL) 200 MG tablet Take 1 tablet (200 mg total) by mouth daily. 90 tablet 1  . montelukast (SINGULAIR) 10 MG tablet Take 1 tablet (10 mg total) by mouth at bedtime. 90 tablet 1  . mupirocin cream (BACTROBAN) 2 % Apply 1 application topically 2 (two) times daily. 15 g  0  . nitroGLYCERIN (NITROSTAT) 0.4 MG SL tablet Place 1 tablet (0.4 mg total) under the tongue every 5 (five) minutes as needed for chest pain. 25 tablet 3  . nystatin (NYSTATIN) powder Apply topically 2 (two) times daily. Apply under breasts 15 g 3  . ondansetron (ZOFRAN-ODT) 4 MG disintegrating tablet Take 1 tablet (4 mg total) by mouth 2 (two) times daily as needed for nausea or vomiting. 30 tablet 1  . OXYGEN Inhale 3 L into the lungs continuous. 3 liters 24/7    . pantoprazole (PROTONIX) 20 MG tablet Take 1 tablet (20 mg total) by mouth 2 (two) times daily before a meal. 180 tablet 1  . potassium chloride SA (K-DUR,KLOR-CON) 20 MEQ tablet Take 1 tablet (20 mEq total) by mouth 3 (three) times daily. 270 tablet 1  . PRESCRIPTION MEDICATION Inhale into the lungs See admin instructions. Use BIPAP every time laying down    . primidone (MYSOLINE) 50 MG tablet Take 1 tablet (50 mg total) by mouth at bedtime. 90 tablet 3  . Probiotic Product (PROBIOTIC PO) Take 1 tablet by mouth at bedtime.    . triamcinolone lotion (KENALOG) 0.1 % Apply 1 application topically 3 (three) times daily. For up to 14 days and as needed 60 mL 1  . VITAMIN E PO Take 1 tablet by mouth daily.    . Vitamins A & D (VITAMIN A & D PO) Take by mouth as directed.      No current facility-administered medications for this visit.      Allergies:   Xarelto [rivaroxaban]; Ancef [cefazolin]; Levaquin [levofloxacin in d5w]; Lyrica [pregabalin]; Tamiflu [oseltamivir phosphate]; Zoloft [sertraline hcl]; Augmentin [amoxicillin-pot clavulanate]; Ciprofloxacin; Haldol [haloperidol]; Nsaids; Penicillins; and Topamax [topiramate]   Social History:  The patient  reports that she quit smoking about 27 years ago. Her smoking use included Cigarettes. She has a 28.00 pack-year smoking history. She has never used smokeless tobacco. She reports that she does not drink alcohol or use drugs.   Family History:  The patient's family history includes Breast cancer in her mother; COPD in her mother and sister; Diabetes in her mother and sister; Heart disease in her father, maternal grandmother, mother, and sister; Hyperlipidemia in her father.    ROS:  Please see the history of present illness.   All other systems are reviewed and negative.    PHYSICAL EXAM: VS:  BP 100/60   Pulse 95   Ht 4\' 11"  (1.499 m)   Wt 198 lb (89.8 kg)   SpO2 98%   BMI 39.99 kg/m  , BMI Body mass index is 39.99 kg/m. GEN: morbidly obese, well developed, in no acute distress  HEENT: normal Neck: no JVD, carotid bruits, or masses Cardiac: iRRR; no murmurs, rubs, or gallops,no edema  Respiratory:  clear to auscultation bilaterally, decreased breath sounds GI: soft, nontender, nondistended, + BS MS: no deformity or atrophy  Skin: warm and dry  Neuro:  Strength and sensation are intact Psych: euthymic mood, full affect  EKG:  EKG is ordered today. EKG today demonstrates atrial fibrillation, rate 84, PVC's   Recent Labs: 03/14/2016: ALT 11; Magnesium 1.7 07/18/2016: Brain Natriuretic Peptide 115.6; BUN 26; Creat 1.15; Hemoglobin 10.2; Platelets 261; Potassium 4.3; Sodium 139    Lipid Panel     Component Value Date/Time   CHOL 236 (H) 04/19/2016 0932   TRIG 258.0 (H) 04/19/2016 0932   HDL 37.70 (L) 04/19/2016 0932   CHOLHDL 6 04/19/2016 0932   VLDL  51.6 (  H) 04/19/2016 0932   LDLDIRECT 153.0 04/19/2016 0932     Wt Readings from Last 3 Encounters:  08/23/16 198 lb (89.8 kg)  08/07/16 197 lb (89.4 kg)  08/02/16 195 lb 9.6 oz (88.7 kg)      Other studies Reviewed: Additional studies/ records that were reviewed today include: Dr Lysbeth Penner notes, Dr Celesta Aver notes, hospital records    ASSESSMENT AND PLAN:  1.  Permanent atrial fibrillation I have seen Denise Freeman is a 66 y.o. female in the office today who has been referred by Dr Debara Pickett for a Watchman left atrial appendage closure device.  She has a history of permanent atrial fibrillation.  This patients CHA2DS2-VASc Score and unadjusted Ischemic Stroke Rate (% per year) is equal to 3.2 % stroke rate/year from a score of 3 which necessitates long term oral anticoagulation to prevent stroke. She has had GI bleeding on Xarelto. She was previously on Warfarin which she tolerated well.  Unfortunately, she has several co-morbidities including obesity and O2 dependent COPD making her procedural risks prohibitively high. Dr Ambrose Mantle last note indicated that from his standpoint, she would be ok to resume [place]. We have discussed options today.  I think that resuming [place] at this time is a reasonable next step.   She will need close follow up of INR's. She lives in [place] and would prefer to be followed there. Will arrange for [place] CVRR with Denise Freeman.    Follow-up:  With EP prn, Dr Debara Pickett as scheduled  Current medicines are reviewed at length with the patient today.   The patient does not have concerns regarding her medicines.  The following changes were made today:  Start Warfarin 2mg  daily, stop ASA   Today, I have spent 40 minutes with the patient discussing afib management for stroke prevention.  More than 50% of the visit time today was spent on this issue.    Army Fossa, MD  08/23/2016 2:59 PM     [facility] [address] [place]  [phone] (office) [phone] (fax)

## 2016-08-27 ENCOUNTER — Encounter (HOSPITAL_COMMUNITY): Payer: Medicare Other

## 2016-08-28 ENCOUNTER — Ambulatory Visit (INDEPENDENT_AMBULATORY_CARE_PROVIDER_SITE_OTHER): Payer: Medicare Other | Admitting: *Deleted

## 2016-08-28 DIAGNOSIS — I480 Paroxysmal atrial fibrillation: Secondary | ICD-10-CM

## 2016-08-28 LAB — POCT INR: INR: 1

## 2016-08-28 MED ORDER — WARFARIN SODIUM 4 MG PO TABS: 4.0000 mg | ORAL_TABLET | Freq: Every day | ORAL | 2 refills | Status: DC

## 2016-08-29 ENCOUNTER — Telehealth: Payer: Self-pay | Admitting: Pulmonary Disease

## 2016-08-29 ENCOUNTER — Encounter (HOSPITAL_COMMUNITY): Payer: Medicare Other

## 2016-08-29 MED ORDER — MODAFINIL 200 MG PO TABS: 200.0000 mg | ORAL_TABLET | Freq: Every day | ORAL | 1 refills | Status: DC

## 2016-08-29 NOTE — Progress Notes (Signed)
Pulmonary Individual Treatment Plan  Patient Details  Name: Denise Freeman MRN: [id number] Date of Birth: 1951/05/03 Referring Provider:     PULMONARY REHAB COPD ORIENTATION from 05/02/2016 in [place]  Referring Provider  Dr. Halford Chessman      Initial Encounter Date:    PULMONARY REHAB COPD ORIENTATION from 05/02/2016 in [place]  Date  05/02/16  Referring Provider  Dr. 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  atorvastatin (LIPITOR) 40 MG tablet, Take 1 tablet (40 mg total) by mouth daily., Disp: 90 tablet, Rfl: 3 .  budesonide (PULMICORT) 0.5 MG/2ML nebulizer solution, USE 2 ML (0.5 MG TOTAL) VIA NEBULIZER TWICE A DAY, Disp: 360 mL, Rfl: 3 .  cetirizine (ZYRTEC) 10 MG tablet, Take 10 mg by mouth at bedtime., Disp: , Rfl:  .  Cholecalciferol (VITAMIN D3) 400 units CAPS, Take 800 Units by mouth daily., Disp: 180 capsule, Rfl: 3 .  diltiazem (CARDIZEM) 30 MG tablet, Take 1 tablet (30 mg total) by mouth 2 (two) times daily., Disp: 180 tablet, Rfl: 3 .  docusate sodium (COLACE) 100 MG capsule, Take 100 mg by mouth daily., Disp: , Rfl:  .  escitalopram (LEXAPRO) 10 MG tablet, Take 10 mg by mouth at bedtime. , Disp: , Rfl:  .  fenofibrate 54 MG tablet, Take 1 tablet (54 mg total) by mouth daily., Disp: 90 tablet, Rfl: 3 .  ferrous sulfate 324 (65 Fe) MG TBEC, Take 1 tablet by mouth daily., Disp: , Rfl:  .  fluticasone (FLONASE) 50 MCG/ACT nasal spray, Place 1 spray into both nostrils daily as needed for allergies or rhinitis., Disp: 32 g, Rfl: 6 .  furosemide (LASIX) 40 MG tablet, Take 2 tablets (80 mg total) by mouth 2 (two) times daily., Disp: 360 tablet, Rfl: 3 .  Insulin Glargine (BASAGLAR KWIKPEN) 100 UNIT/ML SOPN, Inject 40 units into the skin 2 times daily, Disp: 25 pen, Rfl: 0 .  insulin lispro (HUMALOG KWIKPEN) 100 UNIT/ML KiwkPen, Inject  24-32 units under skin 3x a day, Disp: 35 pen, Rfl: 3 .  Insulin Pen Needle 32G X 4 MM MISC, Use to inject insulin 4 times daily., Disp: 400 each, Rfl: 3 .  ipratropium-albuterol (DUONEB) 0.5-2.5 (3) MG/3ML SOLN, Take 3 mLs by nebulization 4 (four) times daily., Disp: 1080 mL, Rfl: 1 .  metFORMIN (GLUCOPHAGE-XR) 500 MG 24 hr tablet, Take 1 tablet (500 mg total) by mouth 2 (two) times daily., Disp: 180 tablet, Rfl: 2 .  metolazone (ZAROXOLYN) 2.5 MG tablet, Take 1 tablet (2.5 mg total) by mouth daily., Disp: 90 tablet, Rfl: 3 .  mirtazapine (REMERON) 15 MG tablet, Take 15 mg by mouth at bedtime., Disp: , Rfl:  .  modafinil (PROVIGIL) 200 MG tablet, Take 1 tablet (200 mg total) by mouth daily., Disp: 90 tablet, Rfl: 1 .  montelukast (SINGULAIR) 10 MG tablet, Take 1 tablet (10 mg total) by mouth at bedtime., Disp: 90 tablet, Rfl: 1 .  mupirocin cream (BACTROBAN) 2 %, Apply 1 application topically 2 (two) times daily., Disp: 15 g, Rfl: 0 .  nitroGLYCERIN (NITROSTAT) 0.4 MG SL tablet, Place 1 tablet (0.4 mg total) under the tongue every 5 (five) minutes as needed for chest pain., Disp: 25 tablet, Rfl: 3 .  nystatin (NYSTATIN) powder, Apply topically 2 (two) times daily. Apply under breasts, Disp: 15 g, Rfl: 3 .  ondansetron (ZOFRAN-ODT)  4 MG disintegrating tablet, Take 1 tablet (4 mg total) by mouth 2 (two) times daily as needed for nausea or vomiting., Disp: 30 tablet, Rfl: 1 .  OXYGEN, Inhale 3 L into the lungs continuous. 3 liters 24/7, Disp: , Rfl:  .  pantoprazole (PROTONIX) 20 MG tablet, Take 1 tablet (20 mg total) by mouth 2 (two) times daily before a meal., Disp: 180 tablet, Rfl: 1 .  potassium chloride SA (K-DUR,KLOR-CON) 20 MEQ tablet, Take 1 tablet (20 mEq total) by mouth 3 (three) times daily., Disp: 270 tablet, Rfl: 1 .  PRESCRIPTION MEDICATION, Inhale into the lungs See admin instructions. Use BIPAP every time laying down, Disp: , Rfl:  .  primidone (MYSOLINE) 50 MG tablet, Take 1 tablet (50  mg total) by mouth at bedtime., Disp: 90 tablet, Rfl: 3 .  Probiotic Product (PROBIOTIC PO), Take 1 tablet by mouth at bedtime., Disp: , Rfl:  .  triamcinolone lotion (KENALOG) 0.1 %, Apply 1 application topically 3 (three) times daily. For up to 14 days and as needed, Disp: 60 mL, Rfl: 1 .  VITAMIN E PO, Take 1 tablet by mouth daily., Disp: , Rfl:  .  Vitamins A & D (VITAMIN A & D PO), Take by mouth as directed. , Disp: , Rfl:  .  warfarin (COUMADIN) 4 MG tablet, Take 1 tablet (4 mg total) by mouth daily., Disp: 30 tablet, Rfl: 2  Past Medical History: Past Medical History:  Diagnosis Date  . Anemia   . Anxiety   . Asthma   . Atrial fibrillation ([place])   . Bipolar 1 disorder ([place])   . 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 CHF (congestive heart failure) ([place])   . Chronic bronchitis ([place])   . Colon polyps   . COPD (chronic obstructive pulmonary disease) ([place])   . Depression   . Family history of adverse reaction to anesthesia    Uncle was positive for malignant hyperthermia; patient had testing done and was negative.  . Fibromyalgia   . GERD (gastroesophageal reflux disease)   . Gout   . High cholesterol   . History of blood transfusion 06/2015   "bleeding from my rectum"  . History of hiatal hernia   . HOH (hard of hearing)   . Hx of colonic polyps 03/21/2016   3 small adenomas no recall - co-morbidities  . 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 On home oxygen therapy    "3L; 24/7" (03/14/2016)  . OSA treated with BiPAP    uses biPAP, 10 (03/14/2016)  . Osteoarthritis   . Pneumonia   . Type II diabetes mellitus (H[facility])     Tobacco Use: History  Smoking Status  . Former Smoker  . Packs/day: 2.00  . Years: 14.00  . Types: Cigarettes  . Quit date: 04/12/1989  Smokeless Tobacco  . Never Used    Labs: Recent Review Flowsheet Data    Labs for ITP Cardiac and Pulmonary Rehab Latest Ref Rng & Units 11/28/2015 02/28/2016 03/14/2016 04/19/2016 07/22/2016    Cholestrol 0 - 200 mg/dL - - - 236(H) -   LDLDIRECT mg/dL - - - 153.0 -   HDL >39.00 mg/dL - - - 37.70(L) -   Trlycerides 0.0 - 149.0 mg/dL - - - 258.0(H) -   Hemoglobin A1c - 7.8 9.0 8.9(H) - 7.5      Capillary Blood Glucose: Lab Results  Component Value Date   GLUCAP 176 (H) 03/15/2016  GLUCAP 149 (H) 03/15/2016   GLUCAP 117 (H) 03/14/2016   GLUCAP 137 (H) 03/14/2016   GLUCAP 152 (H) 06/26/2015     ADL UCSD:     Pulmonary Assessment Scores    Row Name 05/02/16 1149         ADL UCSD   ADL Phase Entry     SOB Score total 62     Rest 2     Walk 11     Stairs 5     Bath 3     Dress 3       CAT Score   CAT Score 31       mMRC Score   mMRC Score 2        Pulmonary Function Assessment:     Pulmonary Function Assessment - 05/02/16 1120      Pulmonary Function Tests   FVC% 31 %   FEV1% 22 %   FEV1/FVC Ratio 70   RV% 1.88 %   DLCO% 19.55 %     Initial Spirometry Results   FVC% 35 %   FEV1% 30 %   FEV1/FVC Ratio 65   Comments Patient is lelgally blind and rides a skooter to get around.     Post Bronchodilator Spirometry Results   FVC% 35 %   FEV1% 30 %   FEV1/FVC Ratio 65     Breath   Bilateral Breath Sounds Decreased   Shortness of Breath Yes      Exercise Target Goals:    Exercise Program Goal: Individual exercise prescription set with THRR, safety & activity barriers. Participant demonstrates ability to understand and report RPE using BORG scale, to self-measure pulse accurately, and to acknowledge the importance of the exercise prescription.  Exercise Prescription Goal: Starting with aerobic activity 30 plus minutes a day, 3 days per week for initial exercise prescription. Provide home exercise prescription and guidelines that participant acknowledges understanding prior to discharge.  Activity Barriers & Risk Stratification:     Activity Barriers & Cardiac Risk Stratification - 05/02/16 1101      Activity Barriers & Cardiac Risk  Stratification   Activity Barriers Shortness of Breath;History of Falls;Assistive Device;Other (comment)   Comments Legally Blind   Cardiac Risk Stratification High      6 Minute Walk:   Oxygen Initial Assessment:   Oxygen Re-Evaluation:   Oxygen Discharge (Final Oxygen Re-Evaluation):   Initial Exercise Prescription:     Initial Exercise Prescription - 05/02/16 1100      Date of Initial Exercise RX and Referring Provider   Date 05/02/16   Referring Provider Dr. Halford Chessman     Oxygen   Oxygen Continuous   Liters 3     NuStep   Level 2   SPM 15   Minutes 15   METs 1.8     Arm Ergometer   Level 1.5   Watts 15   Minutes 20   METs 1.9     Prescription Details   Frequency (times per week) 3   Duration Progress to 30 minutes of continuous aerobic without signs/symptoms of physical distress     Intensity   THRR REST +  20   THRR 40-80% of Max Heartrate 113-127   Ratings of Perceived Exertion 11-13   Perceived Dyspnea 0-4     Progression   Progression Continue progressive overload as per policy without signs/symptoms or physical distress.     Resistance Training   Training Prescription Yes   Weight 1   Reps  10-12      Perform Capillary Blood Glucose checks as needed.  Exercise Prescription Changes:      Exercise Prescription Changes    Row Name 06/10/16 1400 07/09/16 1300 08/06/16 1500         Response to Exercise   Blood Pressure (Admit) 130/60 130/62 140/72     Blood Pressure (Exercise) 170/60 144/74 152/62     Blood Pressure (Exit) 122/52 138/72 130/76     Heart Rate (Admit) 83 bpm 90 bpm 81 bpm     Heart Rate (Exercise) 83 bpm 74 bpm 76 bpm     Heart Rate (Exit) 52 bpm 97 bpm 66 bpm     Oxygen Saturation (Admit) 98 % 92 % 93 %     Oxygen Saturation (Exercise) 92 % 93 % 95 %     Oxygen Saturation (Exit) 98 % 99 % 94 %     Rating of Perceived Exertion (Exercise) _0 Perceived Dyspnea (Exercise) _1 Duration Progress to 30  minutes of continuous aerobic without signs/symptoms of physical distress Progress to 30 minutes of continuous aerobic without signs/symptoms of physical distress Progress to 30 minutes of  aerobic without signs/symptoms of physical distress     Intensity Rest + 20 Rest + 20 THRR unchanged       Progression   Progression Continue progressive overload as per policy without signs/symptoms or physical distress. Continue progressive overload as per policy without signs/symptoms or physical distress. Continue to progress workloads to maintain intensity without signs/symptoms of physical distress.       Resistance Training   Training Prescription Yes Yes Yes     Weight _2 Reps 10-12 10-12 10-15       Oxygen   Oxygen Continuous Continuous Continuous     Liters _3 NuStep   Level _4 SPM _5 Minutes _6 METs 3.59 3.6 3.61       Arm Ergometer   Level 2._7 Watts 29 34 17     Minutes _8 METs 2.1 3.6 2.9       Home Exercise Plan   Plans to continue exercise at [facility] (comment)     Frequency Add 2 additional days to program exercise sessions. Add 2 additional days to program exercise sessions. Add 2 additional days to program exercise sessions.       Exercise Review   Progression Yes Yes No        Exercise Comments:      Exercise Comments    Row Name 05/09/16 1536 06/10/16 1404 07/09/16 1311 08/06/16 1528     Exercise Comments Patient has just started pulmonary rehab and will be proggressed appropriately  Patient is progressing well. Patient is progressing well Patient is doing well but however has not progressed much due to lack of attendance from doctors appointments and personal issues.        Exercise Goals and Review:   Exercise Goals Re-Evaluation :     Exercise Goals Re-Evaluation    Row Name 08/06/16 1643             Exercise Goal Re-Evaluation   Exercise Goals Review Increase Strenth and  Stamina;Increase Physical Activity  Comments Patient is progressing well in the program with increased strength and stamina.        Expected Outcomes Patient will continue to progress in the program.           Discharge Exercise Prescription (Final Exercise Prescription Changes):     Exercise Prescription Changes - 08/06/16 1500      Response to Exercise   Blood Pressure (Admit) 140/72   Blood Pressure (Exercise) 152/62   Blood Pressure (Exit) 130/76   Heart Rate (Admit) 81 bpm   Heart Rate (Exercise) 76 bpm   Heart Rate (Exit) 66 bpm   Oxygen Saturation (Admit) 93 %   Oxygen Saturation (Exercise) 95 %   Oxygen Saturation (Exit) 94 %   Rating of Perceived Exertion (Exercise) 9   Perceived Dyspnea (Exercise) 9   Duration Progress to 30 minutes of  aerobic without signs/symptoms of physical distress   Intensity THRR unchanged     Progression   Progression Continue to progress workloads to maintain intensity without signs/symptoms of physical distress.     Resistance Training   Training Prescription Yes   Weight 3   Reps 10-15     Oxygen   Oxygen Continuous   Liters 3     NuStep   Level 3   SPM 22   Minutes 20   METs 3.61     Arm Ergometer   Level 3   Watts 17   Minutes 15   METs 2.9     Home Exercise Plan   Plans to continue exercise at Home (comment)   Frequency Add 2 additional days to program exercise sessions.     Exercise Review   Progression No      Nutrition:  Target Goals: Understanding of nutrition guidelines, daily intake of sodium <1556m, cholesterol <2046m calories 30% from fat and 7% or less from saturated fats, daily to have 5 or more servings of fruits and vegetables.  Biometrics:     Pre Biometrics - 05/02/16 1141      Pre Biometrics   Height _0  (1.499 m)   Weight 194 lb 3.6 oz (88.1 kg)   Waist Circumference 46 inches   Hip Circumference 43 inches   Waist to Hip Ratio 1.07 %   BMI (Calculated) 39.3   Triceps Skinfold  7 mm   % Body Fat 43.1 %   Grip Strength 57.67 kg   Flexibility 0 in   Single Leg Stand 5 seconds       Nutrition Therapy Plan and Nutrition Goals:   Nutrition Discharge: Rate Your Plate Scores:     Nutrition Assessments - 05/02/16 1232      Rate Your Plate Scores   Pre Score 51      Nutrition Goals Re-Evaluation:   Nutrition Goals Discharge (Final Nutrition Goals Re-Evaluation):   Psychosocial: Target Goals: Acknowledge presence or absence of significant depression and/or stress, maximize coping skills, provide positive support system. Participant is able to verbalize types and ability to use techniques and skills needed for reducing stress and depression.  Initial Review & Psychosocial Screening:     Initial Psych Review & Screening - 05/02/16 [place]     Barriers   Psychosocial barriers to participate in program There are no identifiable barriers or psychosocial needs.     Screening Interventions   Interventions Encouraged to exercise      Quality of Life Scores:  Quality of Life - 05/02/16 1143      Quality of Life Scores   Health/Function Pre 18.94 %   Socioeconomic Pre 19.92 %   Psych/Spiritual Pre 29.64 %   Family Pre 26.4 %   GLOBAL Pre 22.41 %      PHQ-9: Recent Review Flowsheet Data    Depression screen [facility] 2/9 07/18/2016 06/07/2016 05/02/2016 04/19/2016   Decreased Interest 0 0 0 0   Down, Depressed, Hopeless 0 0 1 0   PHQ - 2 Score 0 0 1 0   Altered sleeping - - 1 -   Tired, decreased energy - - 1 -   Change in appetite - - 1 -   Feeling bad or failure about yourself  - - 1 -   Trouble concentrating - - 0 -   Moving slowly or fidgety/restless - - 0 -   Suicidal thoughts - - 0 -   PHQ-9 Score - - 5 -   Difficult doing work/chores - - Somewhat difficult -     Interpretation of Total Score  Total Score Depression Severity:  1-4 = Minimal depression, 5-9 = Mild depression, 10-14 =  Moderate depression, 15-19 = Moderately severe depression, 20-27 = Severe depression   Psychosocial Evaluation and Intervention:     Psychosocial Evaluation - 05/02/16 1245      Psychosocial Evaluation & Interventions   Interventions Encouraged to exercise with the program and follow exercise prescription   Comments Patient has no psychosocial barriers identified.    Continue Psychosocial Services  No      Psychosocial Re-Evaluation:     Psychosocial Re-Evaluation    [place] Name 05/16/16 1306 07/11/16 0847 08/06/16 1644         Psychosocial Re-Evaluation   Current issues with  -  - -  Bipolar disorder.      Comments Patient does have Bipolar disorder. [name] QOL score was 22.41 and her PHQ-9 score was 6. She is on an anti-anxiety medication. She has had one emotional outburst during a session. The team was able to calm her down.  90 Day Psychosocial Assessment: Patient psychosocial assessment continues to reveal no barriers to participation in Pulmonary Rehab. Psychosocial areas that continue to affect patient's rehab experience include concerns about healthissues and emotional problems as evidenced by Bipolar Disorder.Patient continues to exhibit some improvement in hercoping skills to deal with her psychosocial concerns. Offered emotional support and reassurance. Patient doesfeel she is making progress toward Pulmonary Rehab goals. Patient reports her health and activity level haveimproved in the past 30 days as evidenced by patient's report of changedability to cope with stressful situations.  Patient reports feeling positive about current and projected progression in Pulmonary Rehab. She has attended 21 sessions. After reviewing the patient's treatment plan, the patient ismaking progress toward Pulmonary Rehab goals. [name] rate of progress toward rehab goals is fair. Plan of action to help patient continue to work towards rehab goals include continuing to attend weekly  sessions. Will continue to monitor and evaluate progress toward psychosocial goal(s). 90 Day Psychosocial Assessment: Patient psychosocial assessment continues to reveal no barriers to participation in Pulmonary Rehab. Psychosocial areas that continue to affect patient's rehab experience include concerns about health issues and emotional problems as evidenced by Bipolar Disorder. Patient continues to exhibit some improvement in her coping skills to deal with her psychosocial concerns. Offered emotional support and reassurance.  Patient does feel she is making progress toward Pulmonary Rehab goals. Patient reports her health and activity level  have improved in the past 30 days as evidenced by patient's report of changed ability to cope with stressful situations. Patient reports feeling positive about current and projected progression in Pulmonary Rehab. She has attended 18 sessions. After reviewing the patient's treatment plan, the patient is making progress toward Pulmonary Rehab goals. [name] rate of progress toward rehab goals is fair. Plan of action to help patient continue to work towards rehab goals include continuing to attend weekly sessions. Will continue to monitor and evaluate progress toward psychosocial goal(s).     Interventions Encouraged to attend Pulmonary Rehabilitation for the exercise;Stress management education Encouraged to attend Pulmonary Rehabilitation for the exercise;Stress management education;Relaxation education Encouraged to attend Pulmonary Rehabilitation for the exercise;Relaxation education;Stress management education     Continue Psychosocial Services  Yes Yes Follow up required by staff        Psychosocial Discharge (Final Psychosocial Re-Evaluation):     Psychosocial Re-Evaluation - 08/06/16 1644      Psychosocial Re-Evaluation   Current issues with --  Bipolar disorder.    Comments 90 Day Psychosocial Assessment: Patient psychosocial assessment continues to  reveal no barriers to participation in Pulmonary Rehab. Psychosocial areas that continue to affect patient's rehab experience include concerns about health issues and emotional problems as evidenced by Bipolar Disorder. Patient continues to exhibit some improvement in her coping skills to deal with her psychosocial concerns. Offered emotional support and reassurance.  Patient does feel she is making progress toward Pulmonary Rehab goals. Patient reports her health and activity level have improved in the past 30 days as evidenced by patient's report of changed ability to cope with stressful situations. Patient reports feeling positive about current and projected progression in Pulmonary Rehab. She has attended 18 sessions. After reviewing the patient's treatment plan, the patient is making progress toward Pulmonary Rehab goals. [name] rate of progress toward rehab goals is fair. Plan of action to help patient continue to work towards rehab goals include continuing to attend weekly sessions. Will continue to monitor and evaluate progress toward psychosocial goal(s).   Interventions Encouraged to attend Pulmonary Rehabilitation for the exercise;Relaxation education;Stress management education   Continue Psychosocial Services  Follow up required by staff       Education: Education Goals: Education classes will be provided on a weekly basis, covering required topics. Participant will state understanding/return demonstration of topics presented.  Learning Barriers/Preferences:     Learning Barriers/Preferences - 05/02/16 1102      Learning Barriers/Preferences   Learning Barriers None   Learning Preferences Written Material;Video;Group Instruction      Education Topics: How Lungs Work and Diseases: - Discuss the anatomy of the lungs and diseases that can affect the lungs, such as COPD.   Exercise: -Discuss the importance of exercise, FITT principles of exercise, normal and abnormal responses  to exercise, and how to exercise safely.   Environmental Irritants: -Discuss types of environmental irritants and how to limit exposure to environmental irritants.   Meds/Inhalers and oxygen: - Discuss respiratory medications, definition of an inhaler and oxygen, and the proper way to use an inhaler and oxygen.   Energy Saving Techniques: - Discuss methods to conserve energy and decrease shortness of breath when performing activities of daily living.    Bronchial Hygiene / Breathing Techniques: - Discuss breathing mechanics, pursed-lip breathing technique,  proper posture, effective ways to clear airways, and other functional breathing techniques   PULMONARY REHAB CHRONIC OBSTRUCTIVE PULMONARY DISEASE from 07/02/2016 in [place]  Date  05/09/16  Educator  Nils Flack  Instruction Review Code  2- meets goals/outcomes      Cleaning Equipment: - Provides group verbal and written instruction about the health risks of elevated stress, cause of high stress, and healthy ways to reduce stress.   Nutrition I: Fats: - Discuss the types of cholesterol, what cholesterol does to the body, and how cholesterol levels can be controlled.   PULMONARY REHAB CHRONIC OBSTRUCTIVE PULMONARY DISEASE from 07/02/2016 in [place]  Date  05/16/16  Educator  Russella Dar  Instruction Review Code  2- meets goals/outcomes      Nutrition II: Labels: -Discuss the different components of food labels and how to read food labels.   PULMONARY REHAB CHRONIC OBSTRUCTIVE PULMONARY DISEASE from 07/02/2016 in [place]  Date  06/25/16  Educator  DC  Instruction Review Code  2- meets goals/outcomes      Respiratory Infections: - Discuss the signs and symptoms of respiratory infections, ways to prevent respiratory infections, and the importance of seeking medical treatment when having a respiratory infection.   PULMONARY REHAB CHRONIC  OBSTRUCTIVE PULMONARY DISEASE from 07/02/2016 in [place]  Date  06/06/16  Educator  Lisabeth Register  Instruction Review Code  2- meets goals/outcomes      Stress I: Signs and Symptoms: - Discuss the causes of stress, how stress may lead to anxiety and depression, and ways to limit stress.   PULMONARY REHAB CHRONIC OBSTRUCTIVE PULMONARY DISEASE from 07/02/2016 in [place]  Date  06/13/16  Educator  [place]  Instruction Review Code  2- meets goals/outcomes      Stress II: Relaxation: -Discuss relaxation techniques to limit stress.   Oxygen for Home/Travel: - Discuss how to prepare for travel when on oxygen and proper ways to transport and store oxygen to ensure safety.   Knowledge Questionnaire Score:     Knowledge Questionnaire Score - 05/02/16 1104      Knowledge Questionnaire Score   Pre Score 6/14      Core Components/Risk Factors/Patient Goals at Admission:     Personal Goals and Risk Factors at Admission - 05/02/16 1241      Core Components/Risk Factors/Patient Goals on Admission    Weight Management Yes;Obesity   Intervention Weight Management: Develop a combined nutrition and exercise program designed to reach desired caloric intake, while maintaining appropriate intake of nutrient and fiber, sodium and fats, and appropriate energy expenditure required for the weight goal.   Increase Strength and Stamina Yes   Intervention Provide advice, education, support and counseling about physical activity/exercise needs.;Develop an individualized exercise prescription for aerobic and resistive training based on initial evaluation findings, risk stratification, comorbidities and participant's personal goals.   Expected Outcomes Achievement of increased cardiorespiratory fitness and enhanced flexibility, muscular endurance and strength shown through measurements of functional capacity and personal statement of participant.   Improve  shortness of breath with ADL's Yes   Intervention Provide education, individualized exercise plan and daily activity instruction to help decrease symptoms of SOB with activities of daily living.   Expected Outcomes Short Term: Achieves a reduction of symptoms when performing activities of daily living.   Develop more efficient breathing techniques such as purse lipped breathing and diaphragmatic breathing; and practicing self-pacing with activity Yes   Intervention Provide education, demonstration and support about specific breathing techniuqes utilized for more efficient breathing. Include techniques such as pursed lipped breathing, diaphragmatic breathing and self-pacing activity.   Expected Outcomes Short Term: Participant  will be able to demonstrate and use breathing techniques as needed throughout daily activities.   Increase knowledge of respiratory medications and ability to use respiratory devices properly  Yes   Intervention Provide education and demonstration as needed of appropriate use of medications, inhalers, and oxygen therapy.   Expected Outcomes Short Term: Achieves understanding of medications use. Understands that oxygen is a medication prescribed by physician. Demonstrates appropriate use of inhaler and oxygen therapy.   Intervention Provide education about proper nutrition, including hydration, and aerobic/resistive exercise prescription along with prescribed medications to achieve blood glucose in normal ranges: Fasting glucose 65-99 mg/dL   Expected Outcomes Long Term: Attainment of HbA1C < 7%.   Personal Goal Other Yes   Personal Goal Get stronger and stay stronger. Do her regular ADL's without SOB.    Intervention Patient will exercise 2 days/week in pulmonary rehab and supplement at home with exercise 3 days/week.    Expected Outcomes Patient will meet her personal goals.       Core Components/Risk Factors/Patient Goals Review:      Goals and Risk Factor Review    Row  Name 05/02/16 1244 05/16/16 1305 06/12/16 1430 07/11/16 0901 08/06/16 1642     Core Components/Risk Factors/Patient Goals Review   Personal Goals Review Weight Management/Obesity;Increase Strength and Stamina;Improve shortness of breath with ADL's Weight Management/Obesity;Increase Strength and Stamina;Improve shortness of breath with ADL's;Develop more efficient breathing techniques such as purse lipped breathing and diaphragmatic breathing and practicing self-pacing with activity.;Diabetes  Get stronger and stay stronger. Do her regular ADL's without SOB.  Weight Management/Obesity;Sedentary;Increase Strength and Stamina;Develop more efficient breathing techniques such as purse lipped breathing and diaphragmatic breathing and practicing self-pacing with activity.;Improve shortness of breath with ADL's;Diabetes  Get stronger and stay stronger. Do her regular ADL's without SOB.  Weight Management/Obesity;Increase Strength and Stamina;Diabetes;Improve shortness of breath with ADL's;Develop more efficient breathing techniques such as purse lipped breathing and diaphragmatic breathing and practicing self-pacing with activity.  Get stronger; stay stronger; do more without SOB. Weight Management/Obesity;Improve shortness of breath with ADL's;Develop more efficient breathing techniques such as purse lipped breathing and diaphragmatic breathing and practicing self-pacing with activity.;Diabetes   Review  - Patient new to program. She has had 5 sessions. Will continue to monitor for progress.  Patient has attended 14 sessions. She has lost 7.7 lbs since starting the program. She is progressing well in the program with improved SOB and increased strength and stamina.  Patient has attended 21 sessions. Patient has gained 5.7 lbs. She has progressed well in the program with increased strength and stamina and some improvement in SOB. She continue to use O2 continuously. Will continue to monitor for progress.  Patient has  completed 18 sessions. She has gained 4.7 lbs. Her SOB has improved. She has not attended in past week d/t MD appointments.    Expected Outcomes  - Patient will continue to attend sessions meeting her personal goals.  Patient will continue to attend sessions completing the program meeting her personal goals.  Patient will complete the program meeting her personal goals.  Patient will continue to attend sessions.       Core Components/Risk Factors/Patient Goals at Discharge (Final Review):      Goals and Risk Factor Review - 08/06/16 1642      Core Components/Risk Factors/Patient Goals Review   Personal Goals Review Weight Management/Obesity;Improve shortness of breath with ADL's;Develop more efficient breathing techniques such as purse lipped breathing and diaphragmatic breathing and practicing self-pacing with activity.;Diabetes  Review Patient has completed 18 sessions. She has gained 4.7 lbs. Her SOB has improved. She has not attended in past week d/t MD appointments.    Expected Outcomes Patient will continue to attend sessions.       ITP Comments:     ITP Comments    Row Name 05/09/16 1256 08/29/16 1439         ITP Comments Patient met with Registered Dietitian to discuss nutrition topics including: Heart healthty eating, heart health cooking and make smart choices when shopping; Portion control; weight management; and hydration. Patient attended a group session with the hospital chaplian called [facility] to discuss and share how her recent pulmonary diagnosis has effected her life. Patient stopped coming to Pulmonary Rehab after completing 18 sessions d/t illness.          Comments: Patient stopped coming to Pulmonary Rehab on 07/30/16 after completing 18 sessions d/t illness. Doctor will be informed.

## 2016-08-29 NOTE — Addendum Note (Signed)
Encounter addended by: Dwana Melena, RN on: 08/29/2016  2:48 PM<BR>    Actions taken: Visit Navigator Flowsheet section accepted, Sign clinical note, Episode resolved

## 2016-08-29 NOTE — Progress Notes (Signed)
Discharge Summary  Patient Details  Name: [facility] MRN: [id number] Date of Birth: 30-Oct-1950 Referring Provider:     PULMONARY REHAB COPD ORIENTATION from 05/02/2016 in [place]  Referring Provider  Dr. Halford Chessman       Number of Visits: 18  Reason for Discharge:  Early Exit:  Patient stopped coming to Pulmonary rehab after completing 18 sessions d/t illness.   Smoking History:  History  Smoking Status  . Former Smoker  . Packs/day: 2.00  . Years: 14.00  . Types: Cigarettes  . Quit date: 04/12/1989  Smokeless Tobacco  . Never Used    Diagnosis:  COPD with asthma ([place])  ADL UCSD:     Pulmonary Assessment Scores    Row Name 05/02/16 1149         ADL UCSD   ADL Phase Entry     SOB Score total 62     Rest 2     Walk 11     Stairs 5     Bath 3     Dress 3       CAT Score   CAT Score 31       mMRC Score   mMRC Score 2        Initial Exercise Prescription:     Initial Exercise Prescription - 05/02/16 1100      Date of Initial Exercise RX and Referring Provider   Date 05/02/16   Referring Provider Dr. Halford Chessman     Oxygen   Oxygen Continuous   Liters 3     NuStep   Level 2   SPM 15   Minutes 15   METs 1.8     Arm Ergometer   Level 1.5   Watts 15   Minutes 20   METs 1.9     Prescription Details   Frequency (times per week) 3   Duration Progress to 30 minutes of continuous aerobic without signs/symptoms of physical distress     Intensity   THRR REST +  20   THRR 40-80% of Max Heartrate 113-127   Ratings of Perceived Exertion 11-13   Perceived Dyspnea 0-4     Progression   Progression Continue progressive overload as per policy without signs/symptoms or physical distress.     Resistance Training   Training Prescription Yes   Weight 1   Reps 10-12      Discharge Exercise Prescription (Final Exercise Prescription Changes):     Exercise Prescription Changes - 08/06/16 1500      Response to Exercise    Blood Pressure (Admit) 140/72   Blood Pressure (Exercise) 152/62   Blood Pressure (Exit) 130/76   Heart Rate (Admit) 81 bpm   Heart Rate (Exercise) 76 bpm   Heart Rate (Exit) 66 bpm   Oxygen Saturation (Admit) 93 %   Oxygen Saturation (Exercise) 95 %   Oxygen Saturation (Exit) 94 %   Rating of Perceived Exertion (Exercise) 9   Perceived Dyspnea (Exercise) 9   Duration Progress to 30 minutes of  aerobic without signs/symptoms of physical distress   Intensity THRR unchanged     Progression   Progression Continue to progress workloads to maintain intensity without signs/symptoms of physical distress.     Resistance Training   Training Prescription Yes   Weight 3   Reps 10-15     Oxygen   Oxygen Continuous   Liters 3     NuStep   Level 3   SPM 22  Minutes 20   METs 3.61     Arm Ergometer   Level 3   Watts 17   Minutes 15   METs 2.9     Home Exercise Plan   Plans to continue exercise at Home (comment)   Frequency Add 2 additional days to program exercise sessions.     Exercise Review   Progression No      Functional Capacity:   Psychological, QOL, Others - Outcomes: PHQ 2/9: Depression screen [facility] 2/9 07/18/2016 06/07/2016 05/02/2016 04/19/2016  Decreased Interest 0 0 0 0  Down, Depressed, Hopeless 0 0 1 0  PHQ - 2 Score 0 0 1 0  Altered sleeping - - 1 -  Tired, decreased energy - - 1 -  Change in appetite - - 1 -  Feeling bad or failure about yourself  - - 1 -  Trouble concentrating - - 0 -  Moving slowly or fidgety/restless - - 0 -  Suicidal thoughts - - 0 -  PHQ-9 Score - - 5 -  Difficult doing work/chores - - Somewhat difficult -    Quality of Life:     Quality of Life - 05/02/16 1143      Quality of Life Scores   Health/Function Pre 18.94 %   Socioeconomic Pre 19.92 %   Psych/Spiritual Pre 29.64 %   Family Pre 26.4 %   GLOBAL Pre 22.41 %      Personal Goals: Goals established at orientation with interventions provided to work toward  goal.     Personal Goals and Risk Factors at Admission - 05/02/16 1241      Core Components/Risk Factors/Patient Goals on Admission    Weight Management Yes;Obesity   Intervention Weight Management: Develop a combined nutrition and exercise program designed to reach desired caloric intake, while maintaining appropriate intake of nutrient and fiber, sodium and fats, and appropriate energy expenditure required for the weight goal.   Increase Strength and Stamina Yes   Intervention Provide advice, education, support and counseling about physical activity/exercise needs.;Develop an individualized exercise prescription for aerobic and resistive training based on initial evaluation findings, risk stratification, comorbidities and participant's personal goals.   Expected Outcomes Achievement of increased cardiorespiratory fitness and enhanced flexibility, muscular endurance and strength shown through measurements of functional capacity and personal statement of participant.   Improve shortness of breath with ADL's Yes   Intervention Provide education, individualized exercise plan and daily activity instruction to help decrease symptoms of SOB with activities of daily living.   Expected Outcomes Short Term: Achieves a reduction of symptoms when performing activities of daily living.   Develop more efficient breathing techniques such as purse lipped breathing and diaphragmatic breathing; and practicing self-pacing with activity Yes   Intervention Provide education, demonstration and support about specific breathing techniuqes utilized for more efficient breathing. Include techniques such as pursed lipped breathing, diaphragmatic breathing and self-pacing activity.   Expected Outcomes Short Term: Participant will be able to demonstrate and use breathing techniques as needed throughout daily activities.   Increase knowledge of respiratory medications and ability to use respiratory devices properly  Yes    Intervention Provide education and demonstration as needed of appropriate use of medications, inhalers, and oxygen therapy.   Expected Outcomes Short Term: Achieves understanding of medications use. Understands that oxygen is a medication prescribed by physician. Demonstrates appropriate use of inhaler and oxygen therapy.   Intervention Provide education about proper nutrition, including hydration, and aerobic/resistive exercise prescription along with prescribed medications to  achieve blood glucose in normal ranges: Fasting glucose 65-99 mg/dL   Expected Outcomes Long Term: Attainment of HbA1C < 7%.   Personal Goal Other Yes   Personal Goal Get stronger and stay stronger. Do her regular ADL's without SOB.    Intervention Patient will exercise 2 days/week in pulmonary rehab and supplement at home with exercise 3 days/week.    Expected Outcomes Patient will meet her personal goals.        Personal Goals Discharge:     Goals and Risk Factor Review    Row Name 05/02/16 1244 05/16/16 1305 06/12/16 1430 07/11/16 0901 08/06/16 1642     Core Components/Risk Factors/Patient Goals Review   Personal Goals Review Weight Management/Obesity;Increase Strength and Stamina;Improve shortness of breath with ADL's Weight Management/Obesity;Increase Strength and Stamina;Improve shortness of breath with ADL's;Develop more efficient breathing techniques such as purse lipped breathing and diaphragmatic breathing and practicing self-pacing with activity.;Diabetes  Get stronger and stay stronger. Do her regular ADL's without SOB.  Weight Management/Obesity;Sedentary;Increase Strength and Stamina;Develop more efficient breathing techniques such as purse lipped breathing and diaphragmatic breathing and practicing self-pacing with activity.;Improve shortness of breath with ADL's;Diabetes  Get stronger and stay stronger. Do her regular ADL's without SOB.  Weight Management/Obesity;Increase Strength and  Stamina;Diabetes;Improve shortness of breath with ADL's;Develop more efficient breathing techniques such as purse lipped breathing and diaphragmatic breathing and practicing self-pacing with activity.  Get stronger; stay stronger; do more without SOB. Weight Management/Obesity;Improve shortness of breath with ADL's;Develop more efficient breathing techniques such as purse lipped breathing and diaphragmatic breathing and practicing self-pacing with activity.;Diabetes   Review  - Patient new to program. She has had 5 sessions. Will continue to monitor for progress.  Patient has attended 14 sessions. She has lost 7.7 lbs since starting the program. She is progressing well in the program with improved SOB and increased strength and stamina.  Patient has attended 21 sessions. Patient has gained 5.7 lbs. She has progressed well in the program with increased strength and stamina and some improvement in SOB. She continue to use O2 continuously. Will continue to monitor for progress.  Patient has completed 18 sessions. She has gained 4.7 lbs. Her SOB has improved. She has not attended in past week d/t MD appointments.    Expected Outcomes  - Patient will continue to attend sessions meeting her personal goals.  Patient will continue to attend sessions completing the program meeting her personal goals.  Patient will complete the program meeting her personal goals.  Patient will continue to attend sessions.       Nutrition & Weight - Outcomes:     Pre Biometrics - 05/02/16 1141      Pre Biometrics   Height 4\' 11"  (1.499 m)   Weight 194 lb 3.6 oz (88.1 kg)   Waist Circumference 46 inches   Hip Circumference 43 inches   Waist to Hip Ratio 1.07 %   BMI (Calculated) 39.3   Triceps Skinfold 7 mm   % Body Fat 43.1 %   Grip Strength 57.67 kg   Flexibility 0 in   Single Leg Stand 5 seconds       Nutrition:   Nutrition Discharge:     Nutrition Assessments - 05/02/16 1232      Rate Your Plate Scores    Pre Score 51      Education Questionnaire Score:     Knowledge Questionnaire Score - 05/02/16 1104      Knowledge Questionnaire Score   Pre Score 6/14

## 2016-08-29 NOTE — Telephone Encounter (Signed)
Called and spoke with pt and she is aware of the refill of the modafinil that has been called to her pharmacy for refills.  She is aware to call back to schedule appt with VS.  Nothing further is needed.

## 2016-09-02 ENCOUNTER — Encounter: Payer: Self-pay | Admitting: *Deleted

## 2016-09-03 ENCOUNTER — Encounter (HOSPITAL_COMMUNITY): Payer: Medicare Other

## 2016-09-04 ENCOUNTER — Ambulatory Visit (INDEPENDENT_AMBULATORY_CARE_PROVIDER_SITE_OTHER): Payer: Medicare Other | Admitting: *Deleted

## 2016-09-04 DIAGNOSIS — I480 Paroxysmal atrial fibrillation: Secondary | ICD-10-CM

## 2016-09-04 LAB — POCT INR: INR: 3.6

## 2016-09-04 MED ORDER — WARFARIN SODIUM 4 MG PO TABS: ORAL_TABLET | ORAL | 2 refills | Status: DC

## 2016-09-05 ENCOUNTER — Encounter (HOSPITAL_COMMUNITY): Payer: Medicare Other

## 2016-09-06 ENCOUNTER — Emergency Department (HOSPITAL_COMMUNITY): Payer: Medicare Other

## 2016-09-06 ENCOUNTER — Ambulatory Visit (INDEPENDENT_AMBULATORY_CARE_PROVIDER_SITE_OTHER): Payer: Medicare Other | Admitting: Family Medicine

## 2016-09-06 ENCOUNTER — Encounter: Payer: Self-pay | Admitting: Family Medicine

## 2016-09-06 ENCOUNTER — Other Ambulatory Visit: Payer: Self-pay

## 2016-09-06 ENCOUNTER — Encounter (HOSPITAL_COMMUNITY): Payer: Self-pay | Admitting: Emergency Medicine

## 2016-09-06 ENCOUNTER — Inpatient Hospital Stay (HOSPITAL_COMMUNITY)
Admission: EM | Admit: 2016-09-06 | Discharge: 2016-09-14 | DRG: 417 | Disposition: A | Discharge status: Home / Self Care | Payer: Medicare Other | Attending: Family Medicine | Admitting: Family Medicine

## 2016-09-06 DIAGNOSIS — J9611 Chronic respiratory failure with hypoxia: Secondary | ICD-10-CM | POA: Diagnosis not present

## 2016-09-06 DIAGNOSIS — E662 Morbid (severe) obesity with alveolar hypoventilation: Secondary | ICD-10-CM | POA: Diagnosis present

## 2016-09-06 DIAGNOSIS — Z8673 Personal history of transient ischemic attack (TIA), and cerebral infarction without residual deficits: Secondary | ICD-10-CM

## 2016-09-06 DIAGNOSIS — K811 Chronic cholecystitis: Secondary | ICD-10-CM | POA: Diagnosis not present

## 2016-09-06 DIAGNOSIS — R079 Chest pain, unspecified: Secondary | ICD-10-CM | POA: Diagnosis not present

## 2016-09-06 DIAGNOSIS — M797 Fibromyalgia: Secondary | ICD-10-CM | POA: Diagnosis present

## 2016-09-06 DIAGNOSIS — I482 Chronic atrial fibrillation: Secondary | ICD-10-CM | POA: Diagnosis present

## 2016-09-06 DIAGNOSIS — E876 Hypokalemia: Secondary | ICD-10-CM | POA: Diagnosis present

## 2016-09-06 DIAGNOSIS — N183 Chronic kidney disease, stage 3 (moderate): Secondary | ICD-10-CM | POA: Diagnosis present

## 2016-09-06 DIAGNOSIS — K760 Fatty (change of) liver, not elsewhere classified: Secondary | ICD-10-CM | POA: Diagnosis present

## 2016-09-06 DIAGNOSIS — Z825 Family history of asthma and other chronic lower respiratory diseases: Secondary | ICD-10-CM

## 2016-09-06 DIAGNOSIS — J441 Chronic obstructive pulmonary disease with (acute) exacerbation: Secondary | ICD-10-CM | POA: Diagnosis present

## 2016-09-06 DIAGNOSIS — I5043 Acute on chronic combined systolic (congestive) and diastolic (congestive) heart failure: Secondary | ICD-10-CM | POA: Diagnosis present

## 2016-09-06 DIAGNOSIS — Z794 Long term (current) use of insulin: Secondary | ICD-10-CM

## 2016-09-06 DIAGNOSIS — K8020 Calculus of gallbladder without cholecystitis without obstruction: Secondary | ICD-10-CM | POA: Diagnosis not present

## 2016-09-06 DIAGNOSIS — H9190 Unspecified hearing loss, unspecified ear: Secondary | ICD-10-CM | POA: Diagnosis present

## 2016-09-06 DIAGNOSIS — I420 Dilated cardiomyopathy: Secondary | ICD-10-CM | POA: Diagnosis not present

## 2016-09-06 DIAGNOSIS — Z888 Allergy status to other drugs, medicaments and biological substances status: Secondary | ICD-10-CM

## 2016-09-06 DIAGNOSIS — K8012 Calculus of gallbladder with acute and chronic cholecystitis without obstruction: Principal | ICD-10-CM | POA: Diagnosis present

## 2016-09-06 DIAGNOSIS — Z79899 Other long term (current) drug therapy: Secondary | ICD-10-CM

## 2016-09-06 DIAGNOSIS — E1142 Type 2 diabetes mellitus with diabetic polyneuropathy: Secondary | ICD-10-CM | POA: Diagnosis present

## 2016-09-06 DIAGNOSIS — I509 Heart failure, unspecified: Secondary | ICD-10-CM

## 2016-09-06 DIAGNOSIS — I5021 Acute systolic (congestive) heart failure: Secondary | ICD-10-CM | POA: Diagnosis not present

## 2016-09-06 DIAGNOSIS — G8929 Other chronic pain: Secondary | ICD-10-CM | POA: Diagnosis present

## 2016-09-06 DIAGNOSIS — I493 Ventricular premature depolarization: Secondary | ICD-10-CM | POA: Diagnosis present

## 2016-09-06 DIAGNOSIS — R0600 Dyspnea, unspecified: Secondary | ICD-10-CM

## 2016-09-06 DIAGNOSIS — M109 Gout, unspecified: Secondary | ICD-10-CM | POA: Diagnosis present

## 2016-09-06 DIAGNOSIS — Z9981 Dependence on supplemental oxygen: Secondary | ICD-10-CM

## 2016-09-06 DIAGNOSIS — R109 Unspecified abdominal pain: Secondary | ICD-10-CM

## 2016-09-06 DIAGNOSIS — G249 Dystonia, unspecified: Secondary | ICD-10-CM | POA: Diagnosis present

## 2016-09-06 DIAGNOSIS — Z6841 Body Mass Index (BMI) 40.0 and over, adult: Secondary | ICD-10-CM | POA: Diagnosis not present

## 2016-09-06 DIAGNOSIS — E1139 Type 2 diabetes mellitus with other diabetic ophthalmic complication: Secondary | ICD-10-CM | POA: Diagnosis present

## 2016-09-06 DIAGNOSIS — I248 Other forms of acute ischemic heart disease: Secondary | ICD-10-CM | POA: Diagnosis present

## 2016-09-06 DIAGNOSIS — K219 Gastro-esophageal reflux disease without esophagitis: Secondary | ICD-10-CM | POA: Diagnosis present

## 2016-09-06 DIAGNOSIS — R0609 Other forms of dyspnea: Secondary | ICD-10-CM

## 2016-09-06 DIAGNOSIS — I428 Other cardiomyopathies: Secondary | ICD-10-CM

## 2016-09-06 DIAGNOSIS — E785 Hyperlipidemia, unspecified: Secondary | ICD-10-CM | POA: Diagnosis present

## 2016-09-06 DIAGNOSIS — J42 Unspecified chronic bronchitis: Secondary | ICD-10-CM | POA: Diagnosis not present

## 2016-09-06 DIAGNOSIS — N179 Acute kidney failure, unspecified: Secondary | ICD-10-CM | POA: Diagnosis not present

## 2016-09-06 DIAGNOSIS — E559 Vitamin D deficiency, unspecified: Secondary | ICD-10-CM | POA: Diagnosis present

## 2016-09-06 DIAGNOSIS — Z6838 Body mass index (BMI) 38.0-38.9, adult: Secondary | ICD-10-CM

## 2016-09-06 DIAGNOSIS — I4891 Unspecified atrial fibrillation: Secondary | ICD-10-CM | POA: Diagnosis present

## 2016-09-06 DIAGNOSIS — Z886 Allergy status to analgesic agent status: Secondary | ICD-10-CM

## 2016-09-06 DIAGNOSIS — Z8249 Family history of ischemic heart disease and other diseases of the circulatory system: Secondary | ICD-10-CM

## 2016-09-06 DIAGNOSIS — K8010 Calculus of gallbladder with chronic cholecystitis without obstruction: Secondary | ICD-10-CM | POA: Diagnosis not present

## 2016-09-06 DIAGNOSIS — R1011 Right upper quadrant pain: Secondary | ICD-10-CM | POA: Insufficient documentation

## 2016-09-06 DIAGNOSIS — L8990 Pressure ulcer of unspecified site, unspecified stage: Secondary | ICD-10-CM | POA: Insufficient documentation

## 2016-09-06 DIAGNOSIS — J449 Chronic obstructive pulmonary disease, unspecified: Secondary | ICD-10-CM | POA: Diagnosis not present

## 2016-09-06 DIAGNOSIS — R0602 Shortness of breath: Secondary | ICD-10-CM | POA: Diagnosis not present

## 2016-09-06 DIAGNOSIS — E1122 Type 2 diabetes mellitus with diabetic chronic kidney disease: Secondary | ICD-10-CM | POA: Diagnosis present

## 2016-09-06 DIAGNOSIS — Z7901 Long term (current) use of anticoagulants: Secondary | ICD-10-CM

## 2016-09-06 DIAGNOSIS — Z452 Encounter for adjustment and management of vascular access device: Secondary | ICD-10-CM

## 2016-09-06 DIAGNOSIS — Z880 Allergy status to penicillin: Secondary | ICD-10-CM

## 2016-09-06 DIAGNOSIS — J9612 Chronic respiratory failure with hypercapnia: Secondary | ICD-10-CM | POA: Diagnosis present

## 2016-09-06 DIAGNOSIS — G4733 Obstructive sleep apnea (adult) (pediatric): Secondary | ICD-10-CM | POA: Diagnosis not present

## 2016-09-06 DIAGNOSIS — I5032 Chronic diastolic (congestive) heart failure: Secondary | ICD-10-CM

## 2016-09-06 DIAGNOSIS — E118 Type 2 diabetes mellitus with unspecified complications: Secondary | ICD-10-CM | POA: Diagnosis not present

## 2016-09-06 DIAGNOSIS — Z9049 Acquired absence of other specified parts of digestive tract: Secondary | ICD-10-CM

## 2016-09-06 DIAGNOSIS — F419 Anxiety disorder, unspecified: Secondary | ICD-10-CM | POA: Diagnosis present

## 2016-09-06 DIAGNOSIS — M1990 Unspecified osteoarthritis, unspecified site: Secondary | ICD-10-CM | POA: Diagnosis present

## 2016-09-06 DIAGNOSIS — Z803 Family history of malignant neoplasm of breast: Secondary | ICD-10-CM

## 2016-09-06 DIAGNOSIS — F319 Bipolar disorder, unspecified: Secondary | ICD-10-CM | POA: Diagnosis present

## 2016-09-06 DIAGNOSIS — E7800 Pure hypercholesterolemia, unspecified: Secondary | ICD-10-CM | POA: Diagnosis present

## 2016-09-06 DIAGNOSIS — Z881 Allergy status to other antibiotic agents status: Secondary | ICD-10-CM

## 2016-09-06 DIAGNOSIS — Z87891 Personal history of nicotine dependence: Secondary | ICD-10-CM

## 2016-09-06 DIAGNOSIS — Z86010 Personal history of colonic polyps: Secondary | ICD-10-CM

## 2016-09-06 DIAGNOSIS — I5020 Unspecified systolic (congestive) heart failure: Secondary | ICD-10-CM | POA: Diagnosis not present

## 2016-09-06 DIAGNOSIS — I130 Hypertensive heart and chronic kidney disease with heart failure and stage 1 through stage 4 chronic kidney disease, or unspecified chronic kidney disease: Secondary | ICD-10-CM | POA: Diagnosis not present

## 2016-09-06 DIAGNOSIS — Z833 Family history of diabetes mellitus: Secondary | ICD-10-CM

## 2016-09-06 DIAGNOSIS — H543 Unqualified visual loss, both eyes: Secondary | ICD-10-CM | POA: Diagnosis present

## 2016-09-06 DIAGNOSIS — R918 Other nonspecific abnormal finding of lung field: Secondary | ICD-10-CM | POA: Diagnosis not present

## 2016-09-06 DIAGNOSIS — I5023 Acute on chronic systolic (congestive) heart failure: Secondary | ICD-10-CM | POA: Diagnosis not present

## 2016-09-06 LAB — I-STAT TROPONIN, ED: TROPONIN I, POC: 0.05 ng/mL (ref 0.00–0.08)

## 2016-09-06 LAB — COMPREHENSIVE METABOLIC PANEL
ALBUMIN: 4.1 g/dL (ref 3.5–5.0)
ALT: 15 U/L (ref 14–54)
ANION GAP: 17 — AB (ref 5–15)
AST: 30 U/L (ref 15–41)
Alkaline Phosphatase: 65 U/L (ref 38–126)
BILIRUBIN TOTAL: 0.4 mg/dL (ref 0.3–1.2)
BUN: 29 mg/dL — ABNORMAL HIGH (ref 6–20)
CO2: 32 mmol/L (ref 22–32)
Calcium: 9 mg/dL (ref 8.9–10.3)
Chloride: 87 mmol/L — ABNORMAL LOW (ref 101–111)
Creatinine, Ser: 1.27 mg/dL — ABNORMAL HIGH (ref 0.44–1.00)
GFR calc non Af Amer: 43 mL/min — ABNORMAL LOW (ref >60)
GFR, EST AFRICAN AMERICAN: 50 mL/min — AB (ref >60)
GLUCOSE: 221 mg/dL — AB (ref 65–99)
POTASSIUM: 3.5 mmol/L (ref 3.5–5.1)
SODIUM: 136 mmol/L (ref 135–145)
TOTAL PROTEIN: 7.1 g/dL (ref 6.5–8.1)

## 2016-09-06 LAB — PROTIME-INR
INR: 2.66
Prothrombin Time: 28.9 seconds — ABNORMAL HIGH (ref 11.4–15.2)

## 2016-09-06 LAB — CBC
HCT: 35.2 % — ABNORMAL LOW (ref 36.0–46.0)
Hemoglobin: 11.1 g/dL — ABNORMAL LOW (ref 12.0–15.0)
MCH: 27.4 pg (ref 26.0–34.0)
MCHC: 31.5 g/dL (ref 30.0–36.0)
MCV: 86.9 fL (ref 78.0–100.0)
Platelets: 271 10*3/uL (ref 150–400)
RBC: 4.05 MIL/uL (ref 3.87–5.11)
RDW: 12 % (ref 11.5–15.5)
WBC: 11.8 10*3/uL — AB (ref 4.0–10.5)

## 2016-09-06 LAB — TROPONIN I: TROPONIN I: 0.05 ng/mL — AB (ref <0.03)

## 2016-09-06 LAB — GLUCOSE, CAPILLARY: Glucose-Capillary: 212 mg/dL — ABNORMAL HIGH (ref 65–99)

## 2016-09-06 LAB — LIPASE, BLOOD: Lipase: 13 U/L (ref 11–51)

## 2016-09-06 LAB — BRAIN NATRIURETIC PEPTIDE: B Natriuretic Peptide: 680.4 pg/mL — ABNORMAL HIGH (ref 0.0–100.0)

## 2016-09-06 MED ORDER — DILTIAZEM HCL 30 MG PO TABS
30.0000 mg | ORAL_TABLET | Freq: Two times a day (BID) | ORAL | Status: DC
  Administered 2016-09-07 (×2): 30 mg via ORAL
  Filled 2016-09-06 (×3): qty 1

## 2016-09-06 MED ORDER — INSULIN ASPART 100 UNIT/ML ~~LOC~~ SOLN
0.0000 [IU] | SUBCUTANEOUS | Status: DC
  Administered 2016-09-07: 3 [IU] via SUBCUTANEOUS
  Administered 2016-09-07 (×4): 2 [IU] via SUBCUTANEOUS

## 2016-09-06 MED ORDER — OXYCODONE-ACETAMINOPHEN 5-325 MG PO TABS
1.0000 | ORAL_TABLET | Freq: Once | ORAL | Status: AC
  Administered 2016-09-06: 1 via ORAL
  Filled 2016-09-06: qty 1

## 2016-09-06 MED ORDER — FUROSEMIDE 10 MG/ML IJ SOLN
80.0000 mg | Freq: Once | INTRAMUSCULAR | Status: AC
  Administered 2016-09-06: 80 mg via INTRAVENOUS
  Filled 2016-09-06: qty 8

## 2016-09-06 MED ORDER — SODIUM CHLORIDE 0.9% FLUSH: 3.0000 mL | INTRAVENOUS | Status: DC | PRN

## 2016-09-06 MED ORDER — ATORVASTATIN CALCIUM 40 MG PO TABS
40.0000 mg | ORAL_TABLET | Freq: Every day | ORAL | Status: DC
  Administered 2016-09-07 – 2016-09-14 (×7): 40 mg via ORAL
  Filled 2016-09-06 (×8): qty 1

## 2016-09-06 MED ORDER — SODIUM CHLORIDE 0.9% FLUSH
3.0000 mL | Freq: Two times a day (BID) | INTRAVENOUS | Status: DC
  Administered 2016-09-07 – 2016-09-12 (×12): 3 mL via INTRAVENOUS

## 2016-09-06 MED ORDER — PANTOPRAZOLE SODIUM 20 MG PO TBEC
20.0000 mg | DELAYED_RELEASE_TABLET | Freq: Two times a day (BID) | ORAL | Status: DC
  Administered 2016-09-07 – 2016-09-14 (×13): 20 mg via ORAL
  Filled 2016-09-06 (×16): qty 1

## 2016-09-06 MED ORDER — SODIUM CHLORIDE 0.9 % IV SOLN: 250.0000 mL | INTRAVENOUS | Status: DC | PRN

## 2016-09-06 MED ORDER — MONTELUKAST SODIUM 10 MG PO TABS
10.0000 mg | ORAL_TABLET | Freq: Every day | ORAL | Status: DC
  Administered 2016-09-07 – 2016-09-13 (×8): 10 mg via ORAL
  Filled 2016-09-06 (×9): qty 1

## 2016-09-06 MED ORDER — LORATADINE 10 MG PO TABS
10.0000 mg | ORAL_TABLET | Freq: Every day | ORAL | Status: DC
  Administered 2016-09-07 – 2016-09-13 (×8): 10 mg via ORAL
  Filled 2016-09-06 (×8): qty 1

## 2016-09-06 MED ORDER — ONDANSETRON HCL 4 MG/2ML IJ SOLN: 4.0000 mg | Freq: Four times a day (QID) | INTRAMUSCULAR | Status: DC | PRN

## 2016-09-06 MED ORDER — INSULIN GLARGINE 100 UNIT/ML ~~LOC~~ SOLN
15.0000 [IU] | Freq: Every day | SUBCUTANEOUS | Status: DC
  Administered 2016-09-07: 15 [IU] via SUBCUTANEOUS
  Filled 2016-09-06: qty 0.15

## 2016-09-06 MED ORDER — ESCITALOPRAM OXALATE 10 MG PO TABS
10.0000 mg | ORAL_TABLET | Freq: Every day | ORAL | Status: DC
  Administered 2016-09-07 – 2016-09-13 (×8): 10 mg via ORAL
  Filled 2016-09-06 (×8): qty 1

## 2016-09-06 NOTE — Consult Note (Signed)
Reason for Consult:abdominal pain Referring Physician: [name] is an 66 y.o. female.  HPI: Asked to see patient at the request of Dr.Nanavati for right upper quadrant abdominal pain 2 days. The pain is constant for last 2 days. Location is right upper quadrant of abdomen in is constant and sharp in nature. Eating does not make it better or worse. Changing of position does not make it better or worse. The pain is a 5 out of 10 sharp in nature without radiation. Ultrasound shows gallstones without signs of cholecystitis. The patient has not had this pain before. Patient is a history of severe COPD with home O2 dependent and congestive heart failure. She is short of breath and now atrial fibrillation. Patient denies associated nausea or vomiting.  Past Medical History:  Diagnosis Date  . Anemia   . Anxiety   . Asthma   . Atrial fibrillation ([place])   . Bipolar 1 disorder ([place])   . 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 CHF (congestive heart failure) ([place])   . Chronic bronchitis ([place])   . Colon polyps   . COPD (chronic obstructive pulmonary disease) ([place])   . Depression   . Family history of adverse reaction to anesthesia    Uncle was positive for malignant hyperthermia; patient had testing done and was negative.  . Fibromyalgia   . GERD (gastroesophageal reflux disease)   . Gout   . High cholesterol   . History of blood transfusion 06/2015   "bleeding from my rectum"  . History of hiatal hernia   . HOH (hard of hearing)   . Hx of colonic polyps 03/21/2016   3 small adenomas no recall - co-morbidities  . 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 On home oxygen therapy    "3L; 24/7" (03/14/2016)  . OSA treated with BiPAP    uses biPAP, 10 (03/14/2016)  . Osteoarthritis   . Pneumonia   . Type II diabetes mellitus ([place])     Past Surgical History:  Procedure Laterality Date  . APPENDECTOMY     "they busted"  . bladder stimulator     pt states, "it  cannot be turned off; it's in my right hip; dead battery so it's not working anymore". (03/14/2016)  . BLADDER SUSPENSION     2003, 2006 and 2010  . CATARACT EXTRACTION W/PHACO Right 11/29/2014   Procedure: CATARACT EXTRACTION PHACO AND INTRAOCULAR LENS PLACEMENT (IOC);  Surgeon: Rutherford Guys, MD;  Location: [facility];  Service: Ophthalmology;  Laterality: Right;  CDE:3.81  . CATARACT EXTRACTION W/PHACO Left 12/13/2014   Procedure: CATARACT EXTRACTION PHACO AND INTRAOCULAR LENS PLACEMENT (IOC);  Surgeon: Rutherford Guys, MD;  Location: [facility];  Service: Ophthalmology;  Laterality: Left;  CDE:6.59  . CERVICAL DISC SURGERY N/A 2009   4, 6, and 7 cervical disc replaced  . COLONOSCOPY WITH PROPOFOL N/A 03/15/2016   Procedure: COLONOSCOPY WITH PROPOFOL;  Surgeon: Gatha Mayer, MD;  Location: [facility];  Service: Endoscopy;  Laterality: N/A;  . ESOPHAGOGASTRODUODENOSCOPY (EGD) WITH PROPOFOL N/A 03/15/2016   Procedure: ESOPHAGOGASTRODUODENOSCOPY (EGD) WITH PROPOFOL;  Surgeon: Gatha Mayer, MD;  Location: [facility];  Service: Endoscopy;  Laterality: N/A;  . HEEL SPUR SURGERY Bilateral   . HERNIA REPAIR    . I&D EXTREMITY Right 06/13/2015   Procedure: MINOR IRRIGATION AND DEBRIDEMENT EXTREMITY REMOVAL OF NAIL;  Surgeon: Daryll Brod, MD;  Location: [facility];  Service: Orthopedics;  Laterality:  Right;  Marland Kitchen TUBAL LIGATION    . UMBILICAL HERNIA REPAIR     w/mesh    Family History  Problem Relation Age of Onset  . Heart disease Mother   . COPD Mother   . Diabetes Mother   . Breast cancer Mother   . Heart disease Father   . Hyperlipidemia Father   . COPD Sister   . Heart disease Sister   . Diabetes Sister   . Heart disease Maternal Grandmother     Social History:  reports that she quit smoking about 27 years ago. Her smoking use included Cigarettes. She has a 28.00 pack-year smoking history. She has never used smokeless tobacco. She reports that she does not drink alcohol or use  drugs.  Allergies:  Allergies  Allergen Reactions  . Xarelto [Rivaroxaban] Other (See Comments)    Internal bleeding  . Ancef [Cefazolin] Nausea And Vomiting  . Levaquin [Levofloxacin In D5w] Other (See Comments)    "afib"  . Lyrica [Pregabalin] Hives  . Tamiflu [Oseltamivir Phosphate] Other (See Comments)    "water blisters"  . Zoloft [Sertraline Hcl] Other (See Comments)    Jaw problems, jittery  . Augmentin [Amoxicillin-Pot Clavulanate] Itching  . Ciprofloxacin Itching  . Haldol [Haloperidol] Other (See Comments)    Restless leg  . Nsaids Diarrhea  . Penicillins Itching, Nausea And Vomiting and Rash    Has patient had a PCN reaction causing immediate rash, facial/tongue/throat swelling, SOB or lightheadedness with hypotension: Yes Has patient had a PCN reaction causing severe rash involving mucus membranes or skin necrosis: No Has patient had a PCN reaction that required hospitalization No Has patient had a PCN reaction occurring within the last 10 years: Yes If all of the above answers are "NO", then may proceed with Cephalosporin use.  . Topamax [Topiramate] Nausea Only    Medications: I have reviewed the patient's current medications.  Results for orders placed or performed during the hospital encounter of 09/06/16 (from the past 48 hour(s))  Lipase, blood     Status: None   Collection Time: 09/06/16  3:27 PM  Result Value Ref Range   Lipase 13 11 - 51 U/L  Comprehensive metabolic panel     Status: Abnormal   Collection Time: 09/06/16  3:27 PM  Result Value Ref Range   Sodium 136 135 - 145 mmol/L   Potassium 3.5 3.5 - 5.1 mmol/L   Chloride 87 (L) 101 - 111 mmol/L   CO2 32 22 - 32 mmol/L   Glucose, Bld 221 (H) 65 - 99 mg/dL   BUN 29 (H) 6 - 20 mg/dL   Creatinine, Ser 1.27 (H) 0.44 - 1.00 mg/dL   Calcium 9.0 8.9 - 10.3 mg/dL   Total Protein 7.1 6.5 - 8.1 g/dL   Albumin 4.1 3.5 - 5.0 g/dL   AST 30 15 - 41 U/L   ALT 15 14 - 54 U/L   Alkaline Phosphatase 65 38 -  126 U/L   Total Bilirubin 0.4 0.3 - 1.2 mg/dL   GFR calc non Af Amer 43 (L) >60 mL/min   GFR calc Af Amer 50 (L) >60 mL/min    Comment: (NOTE) The eGFR has been calculated using the CKD EPI equation. This calculation has not been validated in all clinical situations. eGFR's persistently <60 mL/min signify possible Chronic Kidney Disease.    Anion gap 17 (H) 5 - 15  CBC     Status: Abnormal   Collection Time: 09/06/16  3:27 PM  Result Value Ref Range   WBC 11.8 (H) 4.0 - 10.5 K/uL   RBC 4.05 3.87 - 5.11 MIL/uL   Hemoglobin 11.1 (L) 12.0 - 15.0 g/dL   HCT 35.2 (L) 36.0 - 46.0 %   MCV 86.9 78.0 - 100.0 fL   MCH 27.4 26.0 - 34.0 pg   MCHC 31.5 30.0 - 36.0 g/dL   RDW 12.0 11.5 - 15.5 %   Platelets 271 150 - 400 K/uL  Protime-INR     Status: Abnormal   Collection Time: 09/06/16  3:39 PM  Result Value Ref Range   Prothrombin Time 28.9 (H) 11.4 - 15.2 seconds   INR 2.66   Brain natriuretic peptide     Status: Abnormal   Collection Time: 09/06/16  5:56 PM  Result Value Ref Range   B Natriuretic Peptide 680.4 (H) 0.0 - 100.0 pg/mL  I-stat troponin, ED     Status: None   Collection Time: 09/06/16  6:42 PM  Result Value Ref Range   Troponin i, poc 0.05 0.00 - 0.08 ng/mL   Comment 3            Comment: Due to the release kinetics of cTnI, a negative result within the first hours of the onset of symptoms does not rule out myocardial infarction with certainty. If myocardial infarction is still suspected, repeat the test at appropriate intervals.     Dg Chest 2 View  Result Date: 09/06/2016 CLINICAL DATA:  Shortness of breath since Wednesday. EXAM: CHEST  2 VIEW COMPARISON:  06/25/2015 FINDINGS: The heart is enlarged but stable. The mediastinal and hilar contours are within normal limits and unchanged. Mild vascular congestion and chronic bronchitic changes but no pulmonary edema, pleural effusions or focal infiltrates. IMPRESSION: Cardiac enlargement with mild vascular congestion and  chronic bronchitic changes but no infiltrates, edema or effusions. Electronically Signed   By: Marijo Sanes M.D.   On: 09/06/2016 15:59   US Abdomen Limited Ruq  Result Date: 09/06/2016 CLINICAL DATA:  Abdominal pain for 3 days EXAM: US ABDOMEN LIMITED - RIGHT UPPER QUADRANT COMPARISON:  None. FINDINGS: Gallbladder: Multiple shadowing stones measuring up to 8.5 mm. Normal wall thickness. Negative sonographic Murphy's. Common bile duct: Diameter: Normal at 4.9 mm Liver: Coarse echogenic liver consistent with fatty infiltration. No ascites. IMPRESSION: 1. Cholelithiasis without other sonographic features to suggest acute cholecystitis. No biliary dilatation. 2. Fatty infiltration of the liver Electronically Signed   By: Donavan Foil M.D.   On: 09/06/2016 19:17    Review of Systems  Constitutional: Positive for malaise/fatigue. Negative for chills and fever.  HENT: Negative for ear pain and hearing loss.   Eyes: Negative for photophobia and discharge.  Respiratory: Positive for shortness of breath and wheezing. Negative for cough.   Cardiovascular: Positive for leg swelling. Negative for chest pain.  Gastrointestinal: Positive for abdominal pain and heartburn. Negative for diarrhea, nausea and vomiting.  Genitourinary: Negative for dysuria and urgency.  Musculoskeletal: Negative for myalgias and neck pain.  Skin: Negative for itching and rash.  Neurological: Negative for dizziness and headaches.  Endo/Heme/Allergies: Negative for environmental allergies. Bruises/bleeds easily.  Psychiatric/Behavioral: Negative for depression and suicidal ideas.   Blood pressure 110/66, pulse 72, temperature 98.6 F (37 C), temperature source Oral, resp. rate 16, SpO2 100 %. Physical Exam  Constitutional: She is oriented to person, place, and time. She appears well-developed and well-nourished. No distress.  HENT:  Head: Normocephalic and atraumatic.  Mouth/Throat: No oropharyngeal exudate.  Eyes: Pupils are  equal, round, and reactive to light. No scleral icterus.  Neck: Normal range of motion. Neck supple.  Cardiovascular: Intact distal pulses.  An irregularly irregular rhythm present. Tachycardia present.   Respiratory: She has wheezes. She has rales.  GI: There is tenderness in the right upper quadrant. There is no rigidity and no guarding.  Musculoskeletal: She exhibits edema.  Neurological: She is alert and oriented to person, place, and time.  Skin: Skin is warm and dry.  Psychiatric: She has a normal mood and affect. Her behavior is normal.    Assessment/Plan: Cholelithiasis  Unclear if symptoms are from this or other etiology. Will obtain HIDA to evaluate further in a.m. Recommend nothing by mouth for tonight until HIDA  complete. Not sure patient is good surgical candidate given severe comorbidities. She also has signs of congestive heart failure and uncontrolled atrial fibrillation.  Patient Active Problem List   Diagnosis Date Noted  . RUQ pain 09/06/2016  . Dyslipidemia 08/07/2016  . Vision loss 04/19/2016  . Hx of colonic polyps 03/21/2016  . Blood in stool   . Benign neoplasm of cecum   . Benign neoplasm of rectum   . Diabetes ([place]) 03/14/2016  . CKD (chronic kidney disease) stage 3, GFR 30-59 ml/min 03/14/2016  . Melena 03/14/2016  . HLD (hyperlipidemia) 03/14/2016  . Scalp lesion 03/14/2016  . Peripheral polyneuropathy ([place]) 01/26/2016  . Vitamin D deficiency 09/26/2015  . Diabetes mellitus with ophthalmic manifestations 08/28/2015  . Chronic upper GI bleeding 06/24/2015  . COPD with asthma ([place]) 02/22/2015  . Bilateral leg edema 10/13/2014  . OSA (obstructive sleep apnea) 08/22/2014  . Other emphysema ([place]) 05/29/2014  . Chronic hypoxic, hypercapnic respiratory failure 04/21/2014  . Microcytic anemia 04/21/2014  . Fibromyalgia 04/14/2014  . Uncontrolled secondary diabetes mellitus with stage 3 CKD (GFR 30-59) (HCC) 04/14/2014  . Bipolar 1 disorder ([place])  04/14/2014  . Atrial fibrillation [I48.91] 04/12/2014   Chen Holzman A. 09/06/2016, 8:36 PM

## 2016-09-06 NOTE — Progress Notes (Signed)
Subjective:    Patient ID: Denise Freeman , female   DOB: [birth year] , 66 y.o..   MRN: [id number]  HPI  Denise Freeman is a 66 yo female with PMH of COPD (on 3 L [place] 24/7), atrial fib, morbid obesity, hypertension, prior stress induced cardiomyopathy, diabetes, and OSA. here for  Chief Complaint  Patient presents with  . Breathing Problem   Patient comes in complaining of subjective shortness of breath as well as right upper abdominal pain that started Wednesday night when she was lying in bed. She describes the pain as sharp and worse with movement. The pain is not associated with eating or drinking. She notes that at baseline she is able to ambulate around her house although recently even just walking to the bathroom causes her to be short of breath. She has not noticed any worsening swelling in her extremities. She notes that she had 3 breathing treatments last night and one this morning and none of them helped. She recently started Coumadin 2 weeks ago for atrial fibrillation. She was on Xarelto in the past but this was stopped due to GI bleeding. She denies any nausea or vomiting, diarrhea, constipation, dysuria, fevers. Denies any sick contacts. She is taking all of her other medications as prescribed.  Review of Systems: Per HPI. All other systems reviewed and are negative.  Past Medical History: Patient Active Problem List   Diagnosis Date Noted  . RUQ pain 09/06/2016  . Dyslipidemia 08/07/2016  . Vision loss 04/19/2016  . Hx of colonic polyps 03/21/2016  . Blood in stool   . Benign neoplasm of cecum   . Benign neoplasm of rectum   . Diabetes ([place]) 03/14/2016  . CKD (chronic kidney disease) stage 3, GFR 30-59 ml/min 03/14/2016  . Melena 03/14/2016  . HLD (hyperlipidemia) 03/14/2016  . Scalp lesion 03/14/2016  . Peripheral polyneuropathy ([place]) 01/26/2016  . Vitamin D deficiency 09/26/2015  . Diabetes mellitus with ophthalmic manifestations 08/28/2015  . Chronic  upper GI bleeding 06/24/2015  . COPD with asthma ([place]) 02/22/2015  . Bilateral leg edema 10/13/2014  . OSA (obstructive sleep apnea) 08/22/2014  . Other emphysema ([place]) 05/29/2014  . Chronic hypoxic, hypercapnic respiratory failure 04/21/2014  . Microcytic anemia 04/21/2014  . Fibromyalgia 04/14/2014  . Uncontrolled secondary diabetes mellitus with stage 3 CKD (GFR 30-59) (HCC) 04/14/2014  . Bipolar 1 disorder ([place]) 04/14/2014  . Atrial fibrillation [I48.91] 04/12/2014    Medications: reviewed and updated Current Outpatient Prescriptions  Medication Sig Dispense Refill  . atorvastatin (LIPITOR) 40 MG tablet Take 1 tablet (40 mg total) by mouth daily. 90 tablet 3  . budesonide (PULMICORT) 0.5 MG/2ML nebulizer solution USE 2 ML (0.5 MG TOTAL) VIA NEBULIZER TWICE A DAY 360 mL 3  . cetirizine (ZYRTEC) 10 MG tablet Take 10 mg by mouth at bedtime.    . Cholecalciferol (VITAMIN D3) 400 units CAPS Take 800 Units by mouth daily. 180 capsule 3  . diltiazem (CARDIZEM) 30 MG tablet Take 1 tablet (30 mg total) by mouth 2 (two) times daily. 180 tablet 3  . docusate sodium (COLACE) 100 MG capsule Take 100 mg by mouth daily.    Marland Kitchen escitalopram (LEXAPRO) 10 MG tablet Take 10 mg by mouth at bedtime.     . fenofibrate 54 MG tablet Take 1 tablet (54 mg total) by mouth daily. 90 tablet 3  . ferrous sulfate 324 (65 Fe) MG TBEC Take 1 tablet by mouth daily.    . fluticasone (FLONASE)  50 MCG/ACT nasal spray Place 1 spray into both nostrils daily as needed for allergies or rhinitis. 32 g 6  . furosemide (LASIX) 40 MG tablet Take 2 tablets (80 mg total) by mouth 2 (two) times daily. 360 tablet 3  . Insulin Glargine (BASAGLAR KWIKPEN) 100 UNIT/ML SOPN Inject 40 units into the skin 2 times daily 25 pen 0  . insulin lispro (HUMALOG KWIKPEN) 100 UNIT/ML KiwkPen Inject 24-32 units under skin 3x a day 35 pen 3  . Insulin Pen Needle 32G X 4 MM MISC Use to inject insulin 4 times daily. 400 each 3  . ipratropium-albuterol  (DUONEB) 0.5-2.5 (3) MG/3ML SOLN Take 3 mLs by nebulization 4 (four) times daily. 1080 mL 1  . metFORMIN (GLUCOPHAGE-XR) 500 MG 24 hr tablet Take 1 tablet (500 mg total) by mouth 2 (two) times daily. 180 tablet 2  . metolazone (ZAROXOLYN) 2.5 MG tablet Take 1 tablet (2.5 mg total) by mouth daily. 90 tablet 3  . mirtazapine (REMERON) 15 MG tablet Take 15 mg by mouth at bedtime.    . modafinil (PROVIGIL) 200 MG tablet Take 1 tablet (200 mg total) by mouth daily. 90 tablet 1  . montelukast (SINGULAIR) 10 MG tablet Take 1 tablet (10 mg total) by mouth at bedtime. 90 tablet 1  . mupirocin cream (BACTROBAN) 2 % Apply 1 application topically 2 (two) times daily. 15 g 0  . nitroGLYCERIN (NITROSTAT) 0.4 MG SL tablet Place 1 tablet (0.4 mg total) under the tongue every 5 (five) minutes as needed for chest pain. 25 tablet 3  . nystatin (NYSTATIN) powder Apply topically 2 (two) times daily. Apply under breasts 15 g 3  . ondansetron (ZOFRAN-ODT) 4 MG disintegrating tablet Take 1 tablet (4 mg total) by mouth 2 (two) times daily as needed for nausea or vomiting. 30 tablet 1  . OXYGEN Inhale 3 L into the lungs continuous. 3 liters 24/7    . pantoprazole (PROTONIX) 20 MG tablet Take 1 tablet (20 mg total) by mouth 2 (two) times daily before a meal. 180 tablet 1  . potassium chloride SA (K-DUR,KLOR-CON) 20 MEQ tablet Take 1 tablet (20 mEq total) by mouth 3 (three) times daily. 270 tablet 1  . PRESCRIPTION MEDICATION Inhale into the lungs See admin instructions. Use BIPAP every time laying down    . primidone (MYSOLINE) 50 MG tablet Take 1 tablet (50 mg total) by mouth at bedtime. 90 tablet 3  . Probiotic Product (PROBIOTIC PO) Take 1 tablet by mouth at bedtime.    . triamcinolone lotion (KENALOG) 0.1 % Apply 1 application topically 3 (three) times daily. For up to 14 days and as needed 60 mL 1  . VITAMIN E PO Take 1 tablet by mouth daily.    . Vitamins A & D (VITAMIN A & D PO) Take by mouth as directed.     .  warfarin (COUMADIN) 4 MG tablet Take 1 tablet daily except 1/2 tablet on Wednesdays and Saturdays 30 tablet 2   No current facility-administered medications for this visit.     Social Hx:  reports that she quit smoking about 27 years ago. Her smoking use included Cigarettes. She has a 28.00 pack-year smoking history. She has never used smokeless tobacco.   Objective:   BP (!) 102/58   Pulse (!) 47   Temp 98.3 F (36.8 C) (Oral)   Wt 198 lb 12.8 oz (90.2 kg)   SpO2 97%   BMI 40.15 kg/m  Physical Exam  Gen:  NAD, alert, cooperative with exam, sitting in electric scooter HEENT: NCAT, PERRL, clear conjunctiva, oropharynx clear, supple neck Cardiac: Irregular rate and regular rhythm, trace edema in ankles  Respiratory: Non-labored breathing, speaking in full sentences, diminished breath sounds bilaterally Gastrointestinal: soft, obese abdomen, tenderness to  palpation in right upper quadrant Neuro: Alert and oriented x 3 Psych: good insight, normal mood and affect  Assessment & Plan:  RUQ pain Right upper quadrant pain for the last 2 days associated with subjective shortness of breath. Vitals showing borderline low BP and bradycardia (not bradycardic on physical exam though). Respiratory status is stable with home 3 L nasal cannula, decreased breath sounds throughout (unsure of baseline). She is afebrile and otherwise feeling well. Duoneb x 1 given in clinic without relief or change in respiratory exam. Less likely cardiopulmonary etiology of pain however patient is at high risk with end-stage COPD and afib. Less likely PE given that she is on coumadin. More concerned with possible cholecystitis given patient's degree of pain on exam. Could also just be musculoskeletal pain as well but will be more cautious as patient has multiple co morbidities. Patient discussed with attending Dr. Gwendlyn Deutscher.  -Given that it is Friday afternoon and lab/imaging results will come back on a weekend, would prefer  patient be seen in the emergency department for further workup   Smitty Cords, MD [facility], PGY-2

## 2016-09-06 NOTE — ED Triage Notes (Signed)
Pt sts sent here from PCP c/o right sided abd and rib pain; pt sts painful to take a deep breath; pt is on home O2

## 2016-09-06 NOTE — ED Notes (Signed)
Patient transported to Ultrasound 

## 2016-09-06 NOTE — ED Provider Notes (Addendum)
[facility] DEPT Provider Note   CSN: [id number] Arrival date & time: 09/06/16  1519     History   Chief Complaint Chief Complaint  Patient presents with  . Abdominal Pain  . Shortness of Breath    HPI Denise Freeman is a 66 y.o. female.  HPI  Pt with hx of permanent atrial fibrillation, CHF, as well as O2 dependent COPD, morbid obesity, hypertension, diabetes, and OSA. Comes in with cc of abd pain and DIB. Pt reports that on Thursday she started having DIB and the R sided abd pain. R sided abd pain is constant and there is no specific aggravating or relieving factors. PT has no associated n/v/f/c or new diarrhea. Pt also reports that she has been having dib with minimal exertion. Pt is normally able to move around her house, but now walking 5 feet gets her short of breath. Pt has been taking her meds as prescribed for CHF - she denies any new swelling in her legs. Pt saw her pcp and was asked to come to the ER. With hx of AF, pt is on coumadin. She has no hx of PE. At triage patient mentioned that her pain was worse with inspiration - but to me patient denies any pleuritic pain or new cough, wheezing. Pt does have worsening of orthopnea.   Past Medical History:  Diagnosis Date  . Anemia   . Anxiety   . Asthma   . Atrial fibrillation ([place])   . Bipolar 1 disorder ([place])   . 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 CHF (congestive heart failure) ([place])   . Chronic bronchitis ([place])   . Colon polyps   . COPD (chronic obstructive pulmonary disease) ([place])   . Depression   . Family history of adverse reaction to anesthesia    Uncle was positive for malignant hyperthermia; patient had testing done and was negative.  . Fibromyalgia   . GERD (gastroesophageal reflux disease)   . Gout   . High cholesterol   . History of blood transfusion 06/2015   "bleeding from my rectum"  . History of hiatal hernia   . HOH (hard of hearing)   . Hx of colonic polyps 03/21/2016     3 small adenomas no recall - co-morbidities  . 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 On home oxygen therapy    "3L; 24/7" (03/14/2016)  . OSA treated with BiPAP    uses biPAP, 10 (03/14/2016)  . Osteoarthritis   . Pneumonia   . Type II diabetes mellitus [facility])     Patient Active Problem List   Diagnosis Date Noted  . RUQ pain 09/06/2016  . Dyslipidemia 08/07/2016  . Vision loss 04/19/2016  . Hx of colonic polyps 03/21/2016  . Blood in stool   . Benign neoplasm of cecum   . Benign neoplasm of rectum   . Diabetes ([place]) 03/14/2016  . CKD (chronic kidney disease) stage 3, GFR 30-59 ml/min 03/14/2016  . Melena 03/14/2016  . HLD (hyperlipidemia) 03/14/2016  . Scalp lesion 03/14/2016  . Peripheral polyneuropathy ([place]) 01/26/2016  . Vitamin D deficiency 09/26/2015  . Diabetes mellitus with ophthalmic manifestations 08/28/2015  . Chronic upper GI bleeding 06/24/2015  . COPD with asthma ([place]) 02/22/2015  . Bilateral leg edema 10/13/2014  . OSA (obstructive sleep apnea) 08/22/2014  . Other emphysema ([place]) 05/29/2014  . Chronic hypoxic, hypercapnic respiratory failure 04/21/2014  . Microcytic anemia 04/21/2014  . Fibromyalgia 04/14/2014  .  Uncontrolled secondary diabetes mellitus with stage 3 CKD (GFR 30-59) (HCC) 04/14/2014  . Bipolar 1 disorder ([place]) 04/14/2014  . Atrial fibrillation [I48.91] 04/12/2014    Past Surgical History:  Procedure Laterality Date  . APPENDECTOMY     "they busted"  . bladder stimulator     pt states, "it cannot be turned off; it's in my right hip; dead battery so it's not working anymore". (03/14/2016)  . BLADDER SUSPENSION     2003, 2006 and 2010  . CATARACT EXTRACTION W/PHACO Right 11/29/2014   Procedure: CATARACT EXTRACTION PHACO AND INTRAOCULAR LENS PLACEMENT (IOC);  Surgeon: Rutherford Guys, MD;  Location: [facility];  Service: Ophthalmology;  Laterality: Right;  CDE:3.81  . CATARACT EXTRACTION W/PHACO Left 12/13/2014   Procedure: CATARACT EXTRACTION  PHACO AND INTRAOCULAR LENS PLACEMENT (IOC);  Surgeon: Rutherford Guys, MD;  Location: [facility];  Service: Ophthalmology;  Laterality: Left;  CDE:6.59  . CERVICAL DISC SURGERY N/A 2009   4, 6, and 7 cervical disc replaced  . COLONOSCOPY WITH PROPOFOL N/A 03/15/2016   Procedure: COLONOSCOPY WITH PROPOFOL;  Surgeon: Gatha Mayer, MD;  Location: [facility];  Service: Endoscopy;  Laterality: N/A;  . ESOPHAGOGASTRODUODENOSCOPY (EGD) WITH PROPOFOL N/A 03/15/2016   Procedure: ESOPHAGOGASTRODUODENOSCOPY (EGD) WITH PROPOFOL;  Surgeon: Gatha Mayer, MD;  Location: [facility];  Service: Endoscopy;  Laterality: N/A;  . HEEL SPUR SURGERY Bilateral   . HERNIA REPAIR    . I&D EXTREMITY Right 06/13/2015   Procedure: MINOR IRRIGATION AND DEBRIDEMENT EXTREMITY REMOVAL OF NAIL;  Surgeon: Daryll Brod, MD;  Location: [facility];  Service: Orthopedics;  Laterality: Right;  . TUBAL LIGATION    . UMBILICAL HERNIA REPAIR     w/mesh    OB History    No data available       Home Medications    Prior to Admission medications   Medication Sig Start Date End Date Taking? Authorizing Provider  atorvastatin (LIPITOR) 40 MG tablet Take 1 tablet (40 mg total) by mouth daily. 08/03/16   Hillary Corinda Gubler, MD  budesonide (PULMICORT) 0.5 MG/2ML nebulizer solution USE 2 ML (0.5 MG TOTAL) VIA NEBULIZER TWICE A DAY 01/30/16   Chesley Mires, MD  cetirizine (ZYRTEC) 10 MG tablet Take 10 mg by mouth at bedtime.    Historical Provider, MD  Cholecalciferol (VITAMIN D3) 400 units CAPS Take 800 Units by mouth daily. 08/03/16   Hillary Corinda Gubler, MD  diltiazem (CARDIZEM) 30 MG tablet Take 1 tablet (30 mg total) by mouth 2 (two) times daily. 08/07/16   Pixie Casino, MD  docusate sodium (COLACE) 100 MG capsule Take 100 mg by mouth daily.    Historical Provider, MD  escitalopram (LEXAPRO) 10 MG tablet Take 10 mg by mouth at bedtime.     Historical Provider, MD  fenofibrate 54 MG tablet Take 1 tablet (54 mg total)  by mouth daily. 06/04/16   Freeman Kingdom, MD  ferrous sulfate 324 (65 Fe) MG TBEC Take 1 tablet by mouth daily.    Historical Provider, MD  fluticasone (FLONASE) 50 MCG/ACT nasal spray Place 1 spray into both nostrils daily as needed for allergies or rhinitis. 06/09/16   Hillary Corinda Gubler, MD  furosemide (LASIX) 40 MG tablet Take 2 tablets (80 mg total) by mouth 2 (two) times daily. 08/07/16   Pixie Casino, MD  Insulin Glargine [facility] KWIKPEN) 100 UNIT/ML SOPN Inject 40 units into the skin 2 times daily 08/01/16   Freeman Kingdom, MD  insulin lispro (HUMALOG  KWIKPEN) 100 UNIT/ML KiwkPen Inject 24-32 units under skin 3x a day 07/22/16   Freeman Kingdom, MD  Insulin Pen Needle 32G X 4 MM MISC Use to inject insulin 4 times daily. 06/04/16   Freeman Kingdom, MD  ipratropium-albuterol (DUONEB) 0.5-2.5 (3) MG/3ML SOLN Take 3 mLs by nebulization 4 (four) times daily. 01/30/16   Chesley Mires, MD  metFORMIN (GLUCOPHAGE-XR) 500 MG 24 hr tablet Take 1 tablet (500 mg total) by mouth 2 (two) times daily. 08/16/16   Freeman Kingdom, MD  metolazone (ZAROXOLYN) 2.5 MG tablet Take 1 tablet (2.5 mg total) by mouth daily. 08/07/16   Pixie Casino, MD  mirtazapine (REMERON) 15 MG tablet Take 15 mg by mouth at bedtime.    Historical Provider, MD  modafinil (PROVIGIL) 200 MG tablet Take 1 tablet (200 mg total) by mouth daily. 08/29/16   Chesley Mires, MD  montelukast (SINGULAIR) 10 MG tablet Take 1 tablet (10 mg total) by mouth at bedtime. 06/10/16   Chesley Mires, MD  mupirocin cream (BACTROBAN) 2 % Apply 1 application topically 2 (two) times daily. 01/26/16   Betty G Martinique, MD  nitroGLYCERIN (NITROSTAT) 0.4 MG SL tablet Place 1 tablet (0.4 mg total) under the tongue every 5 (five) minutes as needed for chest pain. 10/31/15   Pixie Casino, MD  nystatin (NYSTATIN) powder Apply topically 2 (two) times daily. Apply under breasts 06/09/16   Rogue Bussing, MD  ondansetron (ZOFRAN-ODT) 4 MG disintegrating tablet Take 1  tablet (4 mg total) by mouth 2 (two) times daily as needed for nausea or vomiting. 06/09/16   [place], MD  OXYGEN Inhale 3 L into the lungs continuous. 3 liters 24/7    Historical Provider, MD  pantoprazole (PROTONIX) 20 MG tablet Take 1 tablet (20 mg total) by mouth 2 (two) times daily before a meal. 08/03/16   Hillary Corinda Gubler, MD  potassium chloride SA (K-DUR,KLOR-CON) 20 MEQ tablet Take 1 tablet (20 mEq total) by mouth 3 (three) times daily. 08/03/16   Hillary Corinda Gubler, MD  PRESCRIPTION MEDICATION Inhale into the lungs See admin instructions. Use BIPAP every time laying down    Historical Provider, MD  primidone (MYSOLINE) 50 MG tablet Take 1 tablet (50 mg total) by mouth at bedtime. 08/03/16   Hillary Corinda Gubler, MD  Probiotic Product (PROBIOTIC PO) Take 1 tablet by mouth at bedtime.    Historical Provider, MD  triamcinolone lotion (KENALOG) 0.1 % Apply 1 application topically 3 (three) times daily. For up to 14 days and as needed 01/26/16   Betty G Martinique, MD  VITAMIN E PO Take 1 tablet by mouth daily.    Historical Provider, MD  Vitamins A & D (VITAMIN A & D PO) Take by mouth as directed.     Historical Provider, MD  warfarin (COUMADIN) 4 MG tablet Take 1 tablet daily except 1/2 tablet on Wednesdays and Saturdays 09/04/16   Herminio Commons, MD    Family History Family History  Problem Relation Age of Onset  . Heart disease Mother   . COPD Mother   . Diabetes Mother   . Breast cancer Mother   . Heart disease Father   . Hyperlipidemia Father   . COPD Sister   . Heart disease Sister   . Diabetes Sister   . Heart disease Maternal Grandmother     Social History Social History  Substance Use Topics  . Smoking status: Former Smoker    Packs/day: 2.00    Years:  14.00    Types: Cigarettes    Quit date: 04/12/1989  . Smokeless tobacco: Never Used  . Alcohol use No     Allergies   Xarelto [rivaroxaban]; Ancef [cefazolin]; Levaquin [levofloxacin in  d5w]; Lyrica [pregabalin]; Tamiflu [oseltamivir phosphate]; Zoloft [sertraline hcl]; Augmentin [amoxicillin-pot clavulanate]; Ciprofloxacin; Haldol [haloperidol]; Nsaids; Penicillins; and Topamax [topiramate]   Review of Systems Review of Systems  Respiratory: Positive for shortness of breath.   Gastrointestinal: Positive for abdominal pain.  All other systems reviewed and are negative.    Physical Exam Updated Vital Signs BP 110/66   Pulse 72   Temp 98.6 F (37 C) (Oral)   Resp 16   SpO2 100%   Physical Exam  Constitutional: She is oriented to person, place, and time. She appears well-developed and well-nourished.  HENT:  Head: Normocephalic and atraumatic.  Eyes: EOM are normal. Pupils are equal, round, and reactive to light.  Neck: Neck supple.  Cardiovascular: Normal rate, regular rhythm and normal heart sounds.   No murmur heard. Pulmonary/Chest: Effort normal. No respiratory distress. She has no rales.  Abdominal: Soft. She exhibits no distension. There is tenderness. There is no rebound and no guarding.  RUQ tenderness  Neurological: She is alert and oriented to person, place, and time.  Skin: Skin is warm and dry.  Nursing note and vitals reviewed.    ED Treatments / Results  Labs (all labs ordered are listed, but only abnormal results are displayed) Labs Reviewed  COMPREHENSIVE METABOLIC PANEL - Abnormal; Notable for the following:       Result Value   Chloride 87 (*)    Glucose, Bld 221 (*)    BUN 29 (*)    Creatinine, Ser 1.27 (*)    GFR calc non Af Amer 43 (*)    GFR calc Af Amer 50 (*)    Anion gap 17 (*)    All other components within normal limits  CBC - Abnormal; Notable for the following:    WBC 11.8 (*)    Hemoglobin 11.1 (*)    HCT 35.2 (*)    All other components within normal limits  PROTIME-INR - Abnormal; Notable for the following:    Prothrombin Time 28.9 (*)    All other components within normal limits  BRAIN NATRIURETIC PEPTIDE -  Abnormal; Notable for the following:    B Natriuretic Peptide 680.4 (*)    All other components within normal limits  LIPASE, BLOOD  I-STAT TROPOININ, ED    EKG  EKG Interpretation  Date/Time:  Friday September 06 2016 20:36:33 EDT Ventricular Rate:  104 PR Interval:    QRS Duration: 107 QT Interval:  390 QTC Calculation: 496 R Axis:   -128 Text Interpretation:  Atrial fibrillation Ventricular bigeminy Anterolateral infarct, age indeterminate No acute changes No significant change since last tracing Confirmed by Kathrynn Humble, MD, Thelma Comp [phone]) on 09/06/2016 9:17:43 PM       Radiology Dg Chest 2 View  Result Date: 09/06/2016 CLINICAL DATA:  Shortness of breath since Wednesday. EXAM: CHEST  2 VIEW COMPARISON:  06/25/2015 FINDINGS: The heart is enlarged but stable. The mediastinal and hilar contours are within normal limits and unchanged. Mild vascular congestion and chronic bronchitic changes but no pulmonary edema, pleural effusions or focal infiltrates. IMPRESSION: Cardiac enlargement with mild vascular congestion and chronic bronchitic changes but no infiltrates, edema or effusions. Electronically Signed   By: Marijo Sanes M.D.   On: 09/06/2016 15:59   US Abdomen Limited Ruq  Result Date:  09/06/2016 CLINICAL DATA:  Abdominal pain for 3 days EXAM: US ABDOMEN LIMITED - RIGHT UPPER QUADRANT COMPARISON:  None. FINDINGS: Gallbladder: Multiple shadowing stones measuring up to 8.5 mm. Normal wall thickness. Negative sonographic Murphy's. Common bile duct: Diameter: Normal at 4.9 mm Liver: Coarse echogenic liver consistent with fatty infiltration. No ascites. IMPRESSION: 1. Cholelithiasis without other sonographic features to suggest acute cholecystitis. No biliary dilatation. 2. Fatty infiltration of the liver Electronically Signed   By: Donavan Foil M.D.   On: 09/06/2016 19:17    Procedures Procedures (including critical care time)  Medications Ordered in ED Medications  furosemide (LASIX)  injection 80 mg (not administered)  oxyCODONE-acetaminophen (PERCOCET/ROXICET) 5-325 MG per tablet 1 tablet (not administered)     Initial Impression / Assessment and Plan / ED Course  I have reviewed the triage vital signs and the nursing notes.  Pertinent labs & imaging results that were available during my care of the patient were reviewed by me and considered in my medical decision making (see chart for details).  Clinical Course as of Sep 07 2027  Fri Sep 06, 2016  2028 General Surgery consulted- they will put patient on the list. They would appreciate medicine admission. They are thinking about ordering HIDA scan. Family medicine to admit. Lasix given here x 1 round. US Abdomen Limited RUQ [AN]    Clinical Course User Index [AN] Varney Biles, MD   DDx includes: Pancreatitis Hepatobiliary pathology including cholecystitis Gastritis/PUD SBO ACS syndrome CHF exacerbation PE COPD exacerbation  PT comes in with 2 complains. Abd pain - plan is to get [place]. Based on exam, gall stones possible. Labs look reassuring.  Pt also has dib. PT has orthopnea. Lungs are clear, but CXR does show pulm congestion. Pt's weight is at her normal level and she is taking her meds - but the exertion with dyspnea appears to be CHF exacerbation at this time. PE was also considered in the ddx., as patient is not very mobile - but she is on coumadin already, so chances of PE are less likely compared to CHF exacerbation.  Plan is to get [place], BNP and reassess. Possible admission.        Final Clinical Impressions(s) / ED Diagnoses   Final diagnoses:  Abdominal pain  Symptomatic cholelithiasis  Acute systolic congestive heart failure (HCC)  Dyspnea on exertion    New Prescriptions New Prescriptions   No medications on file     Varney Biles, MD 09/06/16 2029    Varney Biles, MD 09/06/16 2118

## 2016-09-06 NOTE — H&P (Signed)
[name] Medical record number: [id number] Date of birth: 1950/06/04 Age: 66 y.o. Gender: female  Primary Care Provider: Darci Needle, MD Consultants: surgery  Code Status: FULL  Chief Complaint: RUQ abdominal pain and shortness of breath   Assessment and Plan: Denise Freeman is a 66 y.o. female presenting with RUQ pain and shortness of breath. 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   RUQ Abdominal Pain: Vitals stable and afebrile. CBC with mild leukocytosis to 11.8.  Limited abdominal US with cholelithiasis without other signs of cholecystitis, normal duct diameter.LFTs and lipase within normal limits. Pain is not particularly associated with food. No N/V but had 1 episode of non-bloody diarrhea. Symptom possibly due to symptomatic cholelithiasis. Other considerations include symptomatic nephrolithiasis as well. No symptoms of UTI.  - admit to telemetry, attending Dr. Ree Kida   - seen by surgery in the ED, recommended NPO for HIDA as unclear etiology and unsure if patient is a good candidate for surgery given co-morbidities - UA  Shortness of breath: reports of gradual shortness of breath over the past two days, mainly dyspnea on exertion and orthopnea. Vitals stable, on home o2 with normal oxygen saturations. BNP elevated compared to prior to 680. CXR with mild vascular congestion. Likely acute exacerbation of HF. Unlikely PE as patient is on coumadin for Afib and INR therapeutic. ACS considered as well; CP on 4/5; reports of episodes of chest pain relieved with nitroglycerin. EKG unchanged from prior. Heart score 4. No current chest pain. Unlikely due to a COPD exacerbation with no increased cough or sputum production or wheezing. Currently on PO Lasix 80mg  BID and metolazone  at home. Compliant with medication and low sodium diet.  - IV Lasix 80mg  x 1 given in the ED - monitor electrolytes with BMP and Mag - trend troponins - AM cardiology consult since patient reports of increased frequency of angina like episodes in the past two months   Congestive Heart Failure: ECHO from 05/2014 with EF 45-50%.  - lasix as above - per chart history no beta blocker due to COPD.  - home Cardizem  - Ace inhibitor/arb not on medication list. Was on lisinopril prior but there is also a note that states not on ace inhibitor due to CKD. Will need to further investigate in the morning.  - ECHO   Atrial Fibrillation: on Coumadin INR therapeutic. CHADSVaSc score 5.  - coumadin per pharmacy  - home Cardizem   DM2: A1c 7.5 in 07/2016. Home: Lantus 40units at bedtime, SSI, Metformin - holding Metforming - Lantus 15 units - CBG q 4 hr with SSI q 4 while NPO  COPD with asthma: on 3L. No sign of exacerbation  - continue Singulair - was on budesonide at some point but not in current medication list. Will need to clarify in the morning - will add on PRN duoneb if needed.   HLD: - continue Fenofibrate tomorrow   OSA: - BiPap at night - continue Modafinil after HIDA  Vitamin D deficiency:  - continue Vit D after HIDA  Depression:  - Remeron 15mg  bedtime    FEN/GI: NPO sips with meds Prophylaxis: Lovenox   Disposition: admit to family medicine for further evaluation and management  History of Present Illness:  Denise Freeman is a 66 y.o. female presenting with RUQ  abdominal pain and dyspnea on exertion.   Reports that symptoms started gradually late Wednesday night to Thursday. Reports that she could not sleep on the right side due to the pain. Pain does not travel. Pain is not associated with food. She has never had prior symptoms of RUQ abdominal pain before. Denies hematuria, dysuria or increased frequency of urination. Reports of dyspnea on exertion and orthopnea.  Reports that she sleeps on a "wedge" and is unable to lay flat but this seems to have worsened recently. Takes Lasix 80mg  BID and Metolazone as prescribed. Compliant with a low salt diet. Reports of episode of central lower chest pressure with associated diaphoresis on 4/5; this occurred when she was at rest and was relieved with nitroglycerin. Reports of history of these episodes that seemed to be increasing in frequency in the past two months. Reports this occurs about twice a month. Did not have chest pain today. Denies fevers, chills, cough, nausea, vomiting, weakness, or constipation. Reports of one episode of non-bloody non-tarry loose diarrhea yesterday. No sick contacts. Patient is taking all COPD medications as prescribed.   In the ED, patient's vitals were stable with mild tachycardia and afebrile. Labs with wbc 11.8, stable hgb. CMP and lipase unremarkable. EKG unchanged but showed atrial fibrillation. Troponin 0.05. INR 2.66. CXR with mild vascular congestion.    Review Of Systems: Per HPI  ROS  Patient Active Problem List   Diagnosis Date Noted  . RUQ pain 09/06/2016  . Dyslipidemia 08/07/2016  . Vision loss 04/19/2016  . Hx of colonic polyps 03/21/2016  . Blood in stool   . Benign neoplasm of cecum   . Benign neoplasm of rectum   . Diabetes ([place]) 03/14/2016  . CKD (chronic kidney disease) stage 3, GFR 30-59 ml/min 03/14/2016  . Melena 03/14/2016  . HLD (hyperlipidemia) 03/14/2016  . Scalp lesion 03/14/2016  . Peripheral polyneuropathy ([place]) 01/26/2016  . Vitamin D deficiency 09/26/2015  . Diabetes mellitus with ophthalmic manifestations 08/28/2015  . Chronic upper GI bleeding 06/24/2015  . COPD with asthma ([place]) 02/22/2015  . Bilateral leg edema 10/13/2014  . OSA (obstructive sleep apnea) 08/22/2014  . Other emphysema ([place]) 05/29/2014  . Chronic hypoxic, hypercapnic respiratory failure 04/21/2014  . Microcytic anemia 04/21/2014  . Fibromyalgia 04/14/2014  . Uncontrolled  secondary diabetes mellitus with stage 3 CKD (GFR 30-59) (HCC) 04/14/2014  . Bipolar 1 disorder ([place]) 04/14/2014  . Atrial fibrillation [I48.91] 04/12/2014    Past Medical History: Past Medical History:  Diagnosis Date  . Anemia   . Anxiety   . Asthma   . Atrial fibrillation ([place])   . Bipolar 1 disorder ([place])   . 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 CHF (congestive heart failure) ([place])   . Chronic bronchitis ([place])   . Colon polyps   . COPD (chronic obstructive pulmonary disease) ([place])   . Depression   . Family history of adverse reaction to anesthesia    Uncle was positive for malignant hyperthermia; patient had testing done and was negative.  . Fibromyalgia   . GERD (gastroesophageal reflux disease)   . Gout   . High cholesterol   . History of blood transfusion 06/2015   "bleeding from my rectum"  . History of hiatal hernia   . HOH (hard of hearing)   . Hx of colonic polyps 03/21/2016   3 small adenomas no recall - co-morbidities  . 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 On home  oxygen therapy    "3L; 24/7" (03/14/2016)  . OSA treated with BiPAP    uses biPAP, 10 (03/14/2016)  . Osteoarthritis   . Pneumonia   . Type II diabetes mellitus ([place])     Past Surgical History: Past Surgical History:  Procedure Laterality Date  . APPENDECTOMY     "they busted"  . bladder stimulator     pt states, "it cannot be turned off; it's in my right hip; dead battery so it's not working anymore". (03/14/2016)  . BLADDER SUSPENSION     2003, 2006 and 2010  . CATARACT EXTRACTION W/PHACO Right 11/29/2014   Procedure: CATARACT EXTRACTION PHACO AND INTRAOCULAR LENS PLACEMENT (IOC);  Surgeon: Rutherford Guys, MD;  Location: [facility];  Service: Ophthalmology;  Laterality: Right;  CDE:3.81  . CATARACT EXTRACTION W/PHACO Left 12/13/2014   Procedure: CATARACT EXTRACTION PHACO AND INTRAOCULAR LENS PLACEMENT (IOC);  Surgeon: Rutherford Guys, MD;  Location: [facility];  Service: Ophthalmology;   Laterality: Left;  CDE:6.59  . CERVICAL DISC SURGERY N/A 2009   4, 6, and 7 cervical disc replaced  . COLONOSCOPY WITH PROPOFOL N/A 03/15/2016   Procedure: COLONOSCOPY WITH PROPOFOL;  Surgeon: Gatha Mayer, MD;  Location: [place];  Service: Endoscopy;  Laterality: N/A;  . ESOPHAGOGASTRODUODENOSCOPY (EGD) WITH PROPOFOL N/A 03/15/2016   Procedure: ESOPHAGOGASTRODUODENOSCOPY (EGD) WITH PROPOFOL;  Surgeon: Gatha Mayer, MD;  Location: [place];  Service: Endoscopy;  Laterality: N/A;  . HEEL SPUR SURGERY Bilateral   . HERNIA REPAIR    . I&D EXTREMITY Right 06/13/2015   Procedure: MINOR IRRIGATION AND DEBRIDEMENT EXTREMITY REMOVAL OF NAIL;  Surgeon: Daryll Brod, MD;  Location: [place];  Service: Orthopedics;  Laterality: Right;  . TUBAL LIGATION    . UMBILICAL HERNIA REPAIR     w/mesh    Social History: Social History  Substance Use Topics  . Smoking status: Former Smoker    Packs/day: 2.00    Years: 14.00    Types: Cigarettes    Quit date: 04/12/1989  . Smokeless tobacco: Never Used  . Alcohol use No   Additional social history: lives with husband, uses a walker in the house and a scooter outside the house.  Please also refer to relevant sections of EMR.  Family History: Family History  Problem Relation Age of Onset  . Heart disease Mother   . COPD Mother   . Diabetes Mother   . Breast cancer Mother   . Heart disease Father   . Hyperlipidemia Father   . COPD Sister   . Heart disease Sister   . Diabetes Sister   . Heart disease Maternal Grandmother      Allergies and Medications: Allergies  Allergen Reactions  . Xarelto [Rivaroxaban] Other (See Comments)    Internal bleeding  . Ancef [Cefazolin] Nausea And Vomiting  . Levaquin [Levofloxacin In D5w] Other (See Comments)    "afib"  . Lyrica [Pregabalin] Hives  . Tamiflu [Oseltamivir Phosphate] Other (See Comments)    "water blisters"  . Zoloft [Sertraline Hcl] Other (See Comments)    Jaw  problems, jittery  . Augmentin [Amoxicillin-Pot Clavulanate] Itching  . Ciprofloxacin Itching  . Haldol [Haloperidol] Other (See Comments)    Restless leg  . Nsaids Diarrhea  . Penicillins Itching, Nausea And Vomiting and Rash    Has patient had a PCN reaction causing immediate rash, facial/tongue/throat swelling, SOB or lightheadedness with hypotension: Yes Has patient had a PCN reaction causing severe rash involving mucus membranes or skin necrosis:  No Has patient had a PCN reaction that required hospitalization No Has patient had a PCN reaction occurring within the last 10 years: Yes If all of the above answers are "NO", then may proceed with Cephalosporin use.  . Topamax [Topiramate] Nausea Only   No current facility-administered medications on file prior to encounter.    Current Outpatient Prescriptions on File Prior to Encounter  Medication Sig Dispense Refill  . atorvastatin (LIPITOR) 40 MG tablet Take 1 tablet (40 mg total) by mouth daily. 90 tablet 3  . cetirizine (ZYRTEC) 10 MG tablet Take 10 mg by mouth at bedtime.    . Cholecalciferol (VITAMIN D3) 400 units CAPS Take 800 Units by mouth daily. (Patient taking differently: Take 400 Units by mouth 2 (two) times daily. ) 180 capsule 3  . diltiazem (CARDIZEM) 30 MG tablet Take 1 tablet (30 mg total) by mouth 2 (two) times daily. 180 tablet 3  . docusate sodium (COLACE) 100 MG capsule Take 100 mg by mouth daily.    Marland Kitchen escitalopram (LEXAPRO) 10 MG tablet Take 10 mg by mouth at bedtime.     . fenofibrate 54 MG tablet Take 1 tablet (54 mg total) by mouth daily. 90 tablet 3  . ferrous sulfate 324 (65 Fe) MG TBEC Take 1 tablet by mouth daily.    . fluticasone (FLONASE) 50 MCG/ACT nasal spray Place 1 spray into both nostrils daily as needed for allergies or rhinitis. 32 g 6  . furosemide (LASIX) 40 MG tablet Take 2 tablets (80 mg total) by mouth 2 (two) times daily. 360 tablet 3  . Insulin Glargine (BASAGLAR KWIKPEN) 100 UNIT/ML SOPN  Inject 40 units into the skin 2 times daily (Patient taking differently: Inject 40 Units into the skin daily at 10 pm. ) 25 pen 0  . insulin lispro (HUMALOG KWIKPEN) 100 UNIT/ML KiwkPen Inject 24-32 units under skin 3x a day 35 pen 3  . metFORMIN (GLUCOPHAGE-XR) 500 MG 24 hr tablet Take 1 tablet (500 mg total) by mouth 2 (two) times daily. 180 tablet 2  . metolazone (ZAROXOLYN) 2.5 MG tablet Take 1 tablet (2.5 mg total) by mouth daily. 90 tablet 3  . mirtazapine (REMERON) 15 MG tablet Take 15 mg by mouth at bedtime.    . modafinil (PROVIGIL) 200 MG tablet Take 1 tablet (200 mg total) by mouth daily. 90 tablet 1  . montelukast (SINGULAIR) 10 MG tablet Take 1 tablet (10 mg total) by mouth at bedtime. 90 tablet 1  . nitroGLYCERIN (NITROSTAT) 0.4 MG SL tablet Place 1 tablet (0.4 mg total) under the tongue every 5 (five) minutes as needed for chest pain. 25 tablet 3  . nystatin (NYSTATIN) powder Apply topically 2 (two) times daily. Apply under breasts 15 g 3  . ondansetron (ZOFRAN-ODT) 4 MG disintegrating tablet Take 1 tablet (4 mg total) by mouth 2 (two) times daily as needed for nausea or vomiting. 30 tablet 1  . OXYGEN Inhale 3 L into the lungs continuous. 3 liters 24/7    . pantoprazole (PROTONIX) 20 MG tablet Take 1 tablet (20 mg total) by mouth 2 (two) times daily before a meal. 180 tablet 1  . potassium chloride SA (K-DUR,KLOR-CON) 20 MEQ tablet Take 1 tablet (20 mEq total) by mouth 3 (three) times daily. 270 tablet 1  . PRESCRIPTION MEDICATION Inhale into the lungs See admin instructions. Use BIPAP every time laying down    . primidone (MYSOLINE) 50 MG tablet Take 1 tablet (50 mg total) by mouth at  bedtime. 90 tablet 3  . Probiotic Product (PROBIOTIC PO) Take 1 tablet by mouth at bedtime.    . triamcinolone lotion (KENALOG) 0.1 % Apply 1 application topically 3 (three) times daily. For up to 14 days and as needed 60 mL 1  . VITAMIN E PO Take 1 tablet by mouth daily.    Marland Kitchen warfarin (COUMADIN) 4 MG  tablet Take 1 tablet daily except 1/2 tablet on Wednesdays and Saturdays (Patient taking differently: Take 2-4 mg by mouth daily at 6 PM. Take 1 tablet daily except 1/2 tablet on Wednesdays and Saturdays) 30 tablet 2    Objective: BP 130/85   Pulse (!) 108   Temp 98.6 F (37 C) (Oral)   Resp 17   SpO2 98%  Exam: General: NAD, obese Eyes: PERRL, EOMI ENTM: MMM, edentulous Neck: supple normal ROM Cardiovascular: irregularly irregular, mildly tachycardic, no murmurs. DP pulses intact bilaterally. No pitting edema noted on lower extremities bilaterally  Respiratory: on 3L O2, normal work of breathing, diminished breath sounds at bases with some crackles at the bases bilaterally,  Gastrointestinal: TTP of the RUQ and RLQ without guarding or rebound, murphy sign negative, + bowel sounds.  MSK: able to move all extremities equally  Derm: no rashes or ulcerations Neuro: CN 2-12 grossly intact, no focal deficits grossly, normal speech  Psych: mood and affect euthymic    Labs and Imaging: CBC BMET   Recent Labs Lab 09/06/16 1527  WBC 11.8*  HGB 11.1*  HCT 35.2*  PLT 271    Recent Labs Lab 09/06/16 1527  NA 136  K 3.5  CL 87*  CO2 32  BUN 29*  CREATININE 1.27*  GLUCOSE 221*  CALCIUM 9.0    INR: 2.66 LFT and Lipase: within normal limits BNP 650.4 Troponin 0.05  CXR:  IMPRESSION: Cardiac enlargement with mild vascular congestion and chronic bronchitic changes but no infiltrates, edema or effusions.  US Abdomen RUQ:  IMPRESSION: 1. Cholelithiasis without other sonographic features to suggest acute cholecystitis. No biliary dilatation. 2. Fatty infiltration of the liver  Smiley Houseman, MD 09/06/2016, 9:28 PM PGY-2, [place] pager: [phone], text pages welcome

## 2016-09-06 NOTE — Assessment & Plan Note (Addendum)
Right upper quadrant pain for the last 2 days associated with subjective shortness of breath. Vitals showing borderline low BP and bradycardia (not bradycardic on physical exam though). Respiratory status is stable with home 3 L nasal cannula, decreased breath sounds throughout (unsure of baseline). She is afebrile and otherwise feeling well. Duoneb x 1 given in clinic without relief or change in respiratory exam. Less likely cardiopulmonary etiology of pain however patient is at high risk with end-stage COPD and afib. Less likely PE given that she is on coumadin. More concerned with possible cholecystitis given patient's degree of pain on exam. Could also just be musculoskeletal pain as well but will be more cautious as patient has multiple co morbidities. Patient discussed with attending Dr. Gwendlyn Deutscher.  -Given that it is Friday afternoon and lab/imaging results will come back on a weekend, would prefer patient be seen in the emergency department for further workup

## 2016-09-06 NOTE — Patient Instructions (Signed)
Thank you for coming in today, it was so nice to see you! Today we talked about:    Right sided pain: I am concerned that you may have something going on with your gallbladder. You could also have something going on with your lungs. I would like for you to go to the emergency department for further work up. You may need blood work and imaging studies.   If you have any questions or concerns, please do not hesitate to call the office at [phone]. You can also message me directly via MyChart.   Sincerely,  Smitty Cords, MD

## 2016-09-07 ENCOUNTER — Inpatient Hospital Stay (HOSPITAL_COMMUNITY): Payer: Medicare Other

## 2016-09-07 ENCOUNTER — Encounter (HOSPITAL_COMMUNITY): Payer: Self-pay | Admitting: *Deleted

## 2016-09-07 DIAGNOSIS — R0609 Other forms of dyspnea: Secondary | ICD-10-CM

## 2016-09-07 DIAGNOSIS — K8020 Calculus of gallbladder without cholecystitis without obstruction: Secondary | ICD-10-CM

## 2016-09-07 DIAGNOSIS — R0600 Dyspnea, unspecified: Secondary | ICD-10-CM

## 2016-09-07 DIAGNOSIS — I5043 Acute on chronic combined systolic (congestive) and diastolic (congestive) heart failure: Secondary | ICD-10-CM

## 2016-09-07 DIAGNOSIS — G4733 Obstructive sleep apnea (adult) (pediatric): Secondary | ICD-10-CM

## 2016-09-07 DIAGNOSIS — J449 Chronic obstructive pulmonary disease, unspecified: Secondary | ICD-10-CM

## 2016-09-07 DIAGNOSIS — Z794 Long term (current) use of insulin: Secondary | ICD-10-CM

## 2016-09-07 DIAGNOSIS — I5021 Acute systolic (congestive) heart failure: Secondary | ICD-10-CM

## 2016-09-07 DIAGNOSIS — Z9049 Acquired absence of other specified parts of digestive tract: Secondary | ICD-10-CM

## 2016-09-07 DIAGNOSIS — E118 Type 2 diabetes mellitus with unspecified complications: Secondary | ICD-10-CM

## 2016-09-07 LAB — URINALYSIS, ROUTINE W REFLEX MICROSCOPIC
Bilirubin Urine: NEGATIVE
Glucose, UA: NEGATIVE mg/dL
Hgb urine dipstick: NEGATIVE
Ketones, ur: NEGATIVE mg/dL
Nitrite: NEGATIVE
Protein, ur: 30 mg/dL — AB
Specific Gravity, Urine: 1.008 (ref 1.005–1.030)
pH: 7 (ref 5.0–8.0)

## 2016-09-07 LAB — BASIC METABOLIC PANEL
Anion gap: 13 (ref 5–15)
BUN: 32 mg/dL — AB (ref 6–20)
CO2: 35 mmol/L — ABNORMAL HIGH (ref 22–32)
Calcium: 8.9 mg/dL (ref 8.9–10.3)
Chloride: 88 mmol/L — ABNORMAL LOW (ref 101–111)
Creatinine, Ser: 1.27 mg/dL — ABNORMAL HIGH (ref 0.44–1.00)
GFR calc Af Amer: 50 mL/min — ABNORMAL LOW (ref >60)
GFR, EST NON AFRICAN AMERICAN: 43 mL/min — AB (ref >60)
GLUCOSE: 193 mg/dL — AB (ref 65–99)
POTASSIUM: 3.4 mmol/L — AB (ref 3.5–5.1)
Sodium: 136 mmol/L (ref 135–145)

## 2016-09-07 LAB — CBC
HCT: 33.7 % — ABNORMAL LOW (ref 36.0–46.0)
HEMOGLOBIN: 10.7 g/dL — AB (ref 12.0–15.0)
MCH: 27.7 pg (ref 26.0–34.0)
MCHC: 31.8 g/dL (ref 30.0–36.0)
MCV: 87.3 fL (ref 78.0–100.0)
PLATELETS: 255 10*3/uL (ref 150–400)
RBC: 3.86 MIL/uL — ABNORMAL LOW (ref 3.87–5.11)
RDW: 12.2 % (ref 11.5–15.5)
WBC: 12.4 10*3/uL — ABNORMAL HIGH (ref 4.0–10.5)

## 2016-09-07 LAB — PROTIME-INR
INR: 2.65
Prothrombin Time: 28.8 seconds — ABNORMAL HIGH (ref 11.4–15.2)

## 2016-09-07 LAB — GLUCOSE, CAPILLARY
GLUCOSE-CAPILLARY: 225 mg/dL — AB (ref 65–99)
Glucose-Capillary: 198 mg/dL — ABNORMAL HIGH (ref 65–99)
Glucose-Capillary: 189 mg/dL — ABNORMAL HIGH (ref 65–99)
Glucose-Capillary: 189 mg/dL — ABNORMAL HIGH (ref 65–99)
Glucose-Capillary: 232 mg/dL — ABNORMAL HIGH (ref 65–99)

## 2016-09-07 LAB — ECHOCARDIOGRAM COMPLETE
Height: 59 in
Weight: 3142.4 oz

## 2016-09-07 LAB — TROPONIN I
Troponin I: 0.05 ng/mL (ref <0.03)
Troponin I: 0.04 ng/mL (ref <0.03)

## 2016-09-07 LAB — MRSA PCR SCREENING: MRSA by PCR: NEGATIVE

## 2016-09-07 LAB — MAGNESIUM: MAGNESIUM: 1.8 mg/dL (ref 1.7–2.4)

## 2016-09-07 MED ORDER — WARFARIN SODIUM 2 MG PO TABS
4.0000 mg | ORAL_TABLET | ORAL | Status: AC
  Administered 2016-09-08: 4 mg via ORAL
  Filled 2016-09-07: qty 2

## 2016-09-07 MED ORDER — PERFLUTREN LIPID MICROSPHERE
1.0000 mL | INTRAVENOUS | Status: AC | PRN
  Filled 2016-09-07: qty 10

## 2016-09-07 MED ORDER — POTASSIUM CHLORIDE CRYS ER 20 MEQ PO TBCR
40.0000 meq | EXTENDED_RELEASE_TABLET | Freq: Once | ORAL | Status: AC
  Administered 2016-09-07: 40 meq via ORAL
  Filled 2016-09-07: qty 2

## 2016-09-07 MED ORDER — WARFARIN - PHARMACIST DOSING INPATIENT
Freq: Every day | Status: DC
  Administered 2016-09-09: 18:00:00

## 2016-09-07 MED ORDER — INSULIN GLARGINE 100 UNIT/ML ~~LOC~~ SOLN
20.0000 [IU] | Freq: Every day | SUBCUTANEOUS | Status: DC
  Administered 2016-09-07: 20 [IU] via SUBCUTANEOUS
  Filled 2016-09-07: qty 0.2

## 2016-09-07 MED ORDER — FUROSEMIDE 10 MG/ML IJ SOLN
60.0000 mg | Freq: Two times a day (BID) | INTRAMUSCULAR | Status: DC
  Administered 2016-09-07 – 2016-09-09 (×4): 60 mg via INTRAVENOUS
  Filled 2016-09-07 (×4): qty 6

## 2016-09-07 MED ORDER — INSULIN ASPART 100 UNIT/ML ~~LOC~~ SOLN
0.0000 [IU] | Freq: Three times a day (TID) | SUBCUTANEOUS | Status: DC
  Administered 2016-09-08: 3 [IU] via SUBCUTANEOUS
  Administered 2016-09-08 (×2): 2 [IU] via SUBCUTANEOUS
  Administered 2016-09-09: 5 [IU] via SUBCUTANEOUS
  Administered 2016-09-09: 3 [IU] via SUBCUTANEOUS
  Administered 2016-09-09: 9 [IU] via SUBCUTANEOUS
  Administered 2016-09-10: 5 [IU] via SUBCUTANEOUS
  Administered 2016-09-10: 3 [IU] via SUBCUTANEOUS
  Administered 2016-09-11: 1 [IU] via SUBCUTANEOUS

## 2016-09-07 MED ORDER — TECHNETIUM TC 99M MEBROFENIN IV KIT
5.5000 | PACK | Freq: Once | INTRAVENOUS | Status: AC | PRN
  Administered 2016-09-07: 5.5 via INTRAVENOUS

## 2016-09-07 MED ORDER — FUROSEMIDE 10 MG/ML IJ SOLN
80.0000 mg | Freq: Once | INTRAMUSCULAR | Status: AC
  Administered 2016-09-07: 80 mg via INTRAVENOUS
  Filled 2016-09-07: qty 8

## 2016-09-07 MED ORDER — WARFARIN SODIUM 2 MG PO TABS
2.0000 mg | ORAL_TABLET | ORAL | Status: AC
  Administered 2016-09-07: 2 mg via ORAL
  Filled 2016-09-07: qty 1

## 2016-09-07 MED ORDER — DILTIAZEM HCL 30 MG PO TABS
30.0000 mg | ORAL_TABLET | Freq: Four times a day (QID) | ORAL | Status: DC
  Administered 2016-09-07 – 2016-09-08 (×3): 30 mg via ORAL
  Filled 2016-09-07 (×2): qty 1

## 2016-09-07 MED ORDER — FENOFIBRATE 54 MG PO TABS
54.0000 mg | ORAL_TABLET | Freq: Every day | ORAL | Status: DC
  Administered 2016-09-07 – 2016-09-14 (×7): 54 mg via ORAL
  Filled 2016-09-07 (×8): qty 1

## 2016-09-07 MED ORDER — POTASSIUM CHLORIDE CRYS ER 20 MEQ PO TBCR
40.0000 meq | EXTENDED_RELEASE_TABLET | Freq: Once | ORAL | Status: AC
Start: 2016-09-07 — End: 2016-09-07
  Administered 2016-09-07: 40 meq via ORAL
  Filled 2016-09-07: qty 2

## 2016-09-07 MED ORDER — OXYCODONE-ACETAMINOPHEN 5-325 MG PO TABS
1.0000 | ORAL_TABLET | Freq: Three times a day (TID) | ORAL | Status: DC | PRN
  Administered 2016-09-07 – 2016-09-14 (×12): 1 via ORAL
  Filled 2016-09-07 (×12): qty 1

## 2016-09-07 MED ORDER — MIRTAZAPINE 15 MG PO TABS
15.0000 mg | ORAL_TABLET | Freq: Once | ORAL | Status: AC
  Administered 2016-09-07: 15 mg via ORAL
  Filled 2016-09-07: qty 1

## 2016-09-07 MED ORDER — MODAFINIL 100 MG PO TABS: 200.0000 mg | ORAL_TABLET | Freq: Every day | ORAL | Status: DC

## 2016-09-07 MED ORDER — MIRTAZAPINE 7.5 MG PO TABS
15.0000 mg | ORAL_TABLET | Freq: Every day | ORAL | Status: DC
  Administered 2016-09-07 – 2016-09-13 (×7): 15 mg via ORAL
  Filled 2016-09-07 (×6): qty 1
  Filled 2016-09-07: qty 2

## 2016-09-07 MED ORDER — PERFLUTREN LIPID MICROSPHERE
INTRAVENOUS | Status: AC
  Filled 2016-09-07: qty 10

## 2016-09-07 NOTE — Progress Notes (Signed)
Echo is in progress now

## 2016-09-07 NOTE — Progress Notes (Signed)
Pt is doing good in her CPAP from home, tolerated meds and diet, no any specific complain of pain and distress, scheduled for CT renal stone study at some point this evening, will continue to monitor the patient

## 2016-09-07 NOTE — Progress Notes (Signed)
ANTICOAGULATION CONSULT NOTE - Initial Consult  Pharmacy Consult for Coumadin Indication: atrial fibrillation  Allergies  Allergen Reactions  . Xarelto [Rivaroxaban] Other (See Comments)    Internal bleeding  . Ancef [Cefazolin] Nausea And Vomiting  . Levaquin [Levofloxacin In D5w] Other (See Comments)    "afib"  . Lyrica [Pregabalin] Hives  . Tamiflu [Oseltamivir Phosphate] Other (See Comments)    "water blisters"  . Zoloft [Sertraline Hcl] Other (See Comments)    Jaw problems, jittery  . Augmentin [Amoxicillin-Pot Clavulanate] Itching  . Ciprofloxacin Itching  . Haldol [Haloperidol] Other (See Comments)    Restless leg  . Nsaids Diarrhea  . Penicillins Itching, Nausea And Vomiting and Rash    Has patient had a PCN reaction causing immediate rash, facial/tongue/throat swelling, SOB or lightheadedness with hypotension: Yes Has patient had a PCN reaction causing severe rash involving mucus membranes or skin necrosis: No Has patient had a PCN reaction that required hospitalization No Has patient had a PCN reaction occurring within the last 10 years: Yes If all of the above answers are "NO", then may proceed with Cephalosporin use.  . Topamax [Topiramate] Nausea Only    Patient Measurements: Height: 4\' 11"  (149.9 cm) Weight: 196 lb 6.4 oz (89.1 kg) (scale b) IBW/kg (Calculated) : 43.2  Vital Signs: Temp: 99.1 F (37.3 C) (04/06 2302) Temp Source: Oral (04/06 2302) BP: 142/99 (04/06 2302) Pulse Rate: 103 (04/06 2302)  Labs:  Recent Labs  09/04/16 1448 09/06/16 1527 09/06/16 1539 09/06/16 2219  HGB  --  11.1*  --   --   HCT  --  35.2*  --   --   PLT  --  271  --   --   LABPROT  --   --  28.9*  --   INR 3.6  --  2.66  --   CREATININE  --  1.27*  --   --   TROPONINI  --   --   --  0.05*    Estimated Creatinine Clearance: 42.9 mL/min (A) (by C-G formula based on SCr of 1.27 mg/dL (H)).   Medical History: Past Medical History:  Diagnosis Date  . Anemia   .  Anxiety   . Asthma   . Atrial fibrillation ([place])   . Bipolar 1 disorder ([place])   . 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 CHF (congestive heart failure) ([place])   . Chronic bronchitis ([place])   . Colon polyps   . COPD (chronic obstructive pulmonary disease) ([place])   . Depression   . Family history of adverse reaction to anesthesia    Uncle was positive for malignant hyperthermia; patient had testing done and was negative.  . Fibromyalgia   . GERD (gastroesophageal reflux disease)   . Gout   . High cholesterol   . History of blood transfusion 06/2015   "bleeding from my rectum"  . History of hiatal hernia   . HOH (hard of hearing)   . Hx of colonic polyps 03/21/2016   3 small adenomas no recall - co-morbidities  . 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 On home oxygen therapy    "3L; 24/7" (03/14/2016)  . OSA treated with BiPAP    uses biPAP, 10 (03/14/2016)  . Osteoarthritis   . Pneumonia   . Type II diabetes mellitus (HCC)     Medications:  Prescriptions Prior to Admission  Medication Sig Dispense Refill Last Dose  . albuterol (PROVENTIL) (2.5 MG/3ML) 0.083%  nebulizer solution Take 2.5 mg by nebulization every 6 (six) hours as needed for wheezing or shortness of breath.   09/06/2016 at Unknown time  . atorvastatin (LIPITOR) 40 MG tablet Take 1 tablet (40 mg total) by mouth daily. 90 tablet 3 09/06/2016 at Unknown time  . cetirizine (ZYRTEC) 10 MG tablet Take 10 mg by mouth at bedtime.   09/05/2016 at Unknown time  . Cholecalciferol (VITAMIN D3) 400 units CAPS Take 800 Units by mouth daily. (Patient taking differently: Take 400 Units by mouth 2 (two) times daily. ) 180 capsule 3 09/06/2016 at Unknown time  . diltiazem (CARDIZEM) 30 MG tablet Take 1 tablet (30 mg total) by mouth 2 (two) times daily. 180 tablet 3 09/06/2016 at Unknown time  . docusate sodium (COLACE) 100 MG capsule Take 100 mg by mouth daily.   09/06/2016 at Unknown time  . escitalopram (LEXAPRO) 10 MG tablet Take 10 mg  by mouth at bedtime.    09/05/2016 at Unknown time  . fenofibrate 54 MG tablet Take 1 tablet (54 mg total) by mouth daily. 90 tablet 3 09/06/2016 at Unknown time  . ferrous sulfate 324 (65 Fe) MG TBEC Take 1 tablet by mouth daily.   09/05/2016 at Unknown time  . fluticasone (FLONASE) 50 MCG/ACT nasal spray Place 1 spray into both nostrils daily as needed for allergies or rhinitis. 32 g 6 Past Month at Unknown time  . furosemide (LASIX) 40 MG tablet Take 2 tablets (80 mg total) by mouth 2 (two) times daily. 360 tablet 3 09/06/2016 at Unknown time  . Insulin Glargine (BASAGLAR KWIKPEN) 100 UNIT/ML SOPN Inject 40 units into the skin 2 times daily (Patient taking differently: Inject 40 Units into the skin daily at 10 pm. ) 25 pen 0 09/05/2016 at Unknown time  . insulin lispro (HUMALOG KWIKPEN) 100 UNIT/ML KiwkPen Inject 24-32 units under skin 3x a day 35 pen 3 09/06/2016 at Unknown time  . ipratropium (ATROVENT) 0.02 % nebulizer solution Take 0.5 mg by nebulization every 6 (six) hours as needed for wheezing or shortness of breath.   09/06/2016 at Unknown time  . metFORMIN (GLUCOPHAGE-XR) 500 MG 24 hr tablet Take 1 tablet (500 mg total) by mouth 2 (two) times daily. 180 tablet 2 09/06/2016 at Unknown time  . metolazone (ZAROXOLYN) 2.5 MG tablet Take 1 tablet (2.5 mg total) by mouth daily. 90 tablet 3 09/05/2016 at Unknown time  . mirtazapine (REMERON) 15 MG tablet Take 15 mg by mouth at bedtime.   09/05/2016 at Unknown time  . modafinil (PROVIGIL) 200 MG tablet Take 1 tablet (200 mg total) by mouth daily. 90 tablet 1 09/06/2016 at Unknown time  . montelukast (SINGULAIR) 10 MG tablet Take 1 tablet (10 mg total) by mouth at bedtime. 90 tablet 1 09/05/2016 at Unknown time  . nitroGLYCERIN (NITROSTAT) 0.4 MG SL tablet Place 1 tablet (0.4 mg total) under the tongue every 5 (five) minutes as needed for chest pain. 25 tablet 3 Past Month at Unknown time  . nystatin (NYSTATIN) powder Apply topically 2 (two) times daily. Apply under  breasts 15 g 3 09/06/2016 at Unknown time  . ondansetron (ZOFRAN-ODT) 4 MG disintegrating tablet Take 1 tablet (4 mg total) by mouth 2 (two) times daily as needed for nausea or vomiting. 30 tablet 1 Past Week at Unknown time  . oxyCODONE-acetaminophen (PERCOCET/ROXICET) 5-325 MG tablet Take 1 tablet by mouth every 8 (eight) hours as needed for severe pain.   09/06/2016 at Unknown time  . OXYGEN  Inhale 3 L into the lungs continuous. 3 liters 24/7   09/06/2016 at Unknown time  . pantoprazole (PROTONIX) 20 MG tablet Take 1 tablet (20 mg total) by mouth 2 (two) times daily before a meal. 180 tablet 1 09/06/2016 at Unknown time  . potassium chloride SA (K-DUR,KLOR-CON) 20 MEQ tablet Take 1 tablet (20 mEq total) by mouth 3 (three) times daily. 270 tablet 1 09/06/2016 at Unknown time  . PRESCRIPTION MEDICATION Inhale into the lungs See admin instructions. Use BIPAP every time laying down   09/05/2016 at Unknown time  . primidone (MYSOLINE) 50 MG tablet Take 1 tablet (50 mg total) by mouth at bedtime. 90 tablet 3 09/05/2016 at Unknown time  . Probiotic Product (PROBIOTIC PO) Take 1 tablet by mouth at bedtime.   09/05/2016 at Unknown time  . triamcinolone lotion (KENALOG) 0.1 % Apply 1 application topically 3 (three) times daily. For up to 14 days and as needed 60 mL 1 Past Month at Unknown time  . VITAMIN A PO Take 1 tablet by mouth daily.   09/06/2016 at Unknown time  . VITAMIN E PO Take 1 tablet by mouth daily.   09/06/2016 at Unknown time  . warfarin (COUMADIN) 4 MG tablet Take 1 tablet daily except 1/2 tablet on Wednesdays and Saturdays (Patient taking differently: Take 2-4 mg by mouth daily at 6 PM. Take 1 tablet daily except 1/2 tablet on Wednesdays and Saturdays) 30 tablet 2 09/05/2016 at Unknown time    Assessment: 66 y.o. F presents with. Pt on coumadin PTA for afib. INR therapeutic on admission (2.66). CBC stable. Home dose: 4 mg daily except for 2mg  on Wed and Sat  Goal of Therapy:  INR 2-3 Monitor platelets by  anticoagulation protocol: Yes   Plan:  Daily INR Continue home dose of coumadin 4mg  daily except for 2mg  on Wed and Sat  Sherlon Handing, PharmD, BCPS Clinical pharmacist, pager [phone] 09/07/2016,2:41 AM

## 2016-09-07 NOTE — Progress Notes (Signed)
Pt is having SOB, checked oxygen which is 98% @2l  via [place], pt said that she used to take Lasix and metoprolol at home and it is missing here, just paged the doctor regarding her concern, waiting for the answer.

## 2016-09-07 NOTE — Consult Note (Signed)
Cardiology Consultation   Patient ID: Denise Freeman [facility]; [id number]; [birth year]   Admit date: 09/06/2016 Date of Consult: 09/07/2016  Referring Provider:  Smiley Houseman, MD  Primary Care Provider: Darci Needle, MD Cardiologist: Dr. K. Mali Hilty  Electrophysiologist:  Dr. Thompson Grayer   Reason for Consultation: congestive heart failure and chest pain   History of Present Illness: Denise Freeman is a 66 y.o. female who is being seen today for the evaluation of congestive heart failure and chest pain at the request of Smiley Houseman, MD.  She previously lived in [place] and established with [name] a few years ago. 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  CHADS2-VASc= 7 (CVA, HTN, diabetes, female, age 74, CHF).   She was taken off of anticoagulation 2/2 her GI bleed.  She was last seen by Dr. K. Mali Hilty and the patient was hesitant to resume anticoagulation. She was referred to Dr. Rayann Heman for consideration of Watchman device implant. However, Dr. Rayann Heman recommended resumption of Coumadin. He was to see her back as needed.  She presented to the hospital yesterday with complaints of right lower quadrant pain and dyspnea. Abdominal ultrasound demonstrates cholelithiasis. HIDA scan is pending. BNP is elevated and her chest x-ray demonstrates vascular congestion. Records indicate the patient also complained of increased frequency of anginal-like episodes in the past 2 months. Troponin levels are minimally elevated with flat trend.    She tells me that she has chronic dyspnea. Over the last several days she notes worsening dyspnea with just minimal  activities associated with orthopnea, PND and LE edema. She notes a 6 by weight gain at home. She has had some discomfort in her chest. She notes similar symptoms in the past with acute on chronic CHF. She denies syncope. She does note cough with yellowish sputum. She denies hemoptysis. She denies melena, hematochezia, hematuria.  Prior cardiac studies:  1. Echo 12/15:  EF 45-50, mild BAE  2. [place] 03/17/2013 at [facility] [place]:  Normal coronary arteries  3. Myoview 03/2013 at [facility] in [place]:  Small area of mild apical ischemia.  4. Carotid US 03/2013 [facility] in [place]:  No significant ICA stenosis.   Past Medical History:  Diagnosis Date  . Anemia   . Anxiety   . Asthma   . Atrial fibrillation ([place])   . Bipolar 1 disorder ([place])   . 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 CHF (congestive heart failure) ([place])   . Chronic bronchitis ([place])   . Colon polyps   . COPD (chronic obstructive pulmonary disease) ([place])   . Depression   . Family history of adverse reaction to anesthesia    Uncle was positive for malignant hyperthermia; patient had testing done and was negative.  . Fibromyalgia   . GERD (gastroesophageal reflux disease)   . Gout   . High cholesterol   . History of blood transfusion 06/2015   "bleeding from my rectum"  . History of hiatal hernia   . HOH (hard of hearing)   . Hx of colonic polyps 03/21/2016   3 small adenomas no recall - co-morbidities  . 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 On home  oxygen therapy    "3L; 24/7" (03/14/2016)  . OSA treated with BiPAP    uses biPAP, 10 (03/14/2016)  . Osteoarthritis   . Pneumonia   . Type II diabetes mellitus ([place])     Past Surgical History:  Procedure Laterality Date  . APPENDECTOMY     "they busted"  . bladder stimulator     pt states, "it cannot be turned off; it's in my right hip; dead battery so it's not working anymore". (03/14/2016)  . BLADDER SUSPENSION     2003, 2006  and 2010  . CATARACT EXTRACTION W/PHACO Right 11/29/2014   Procedure: CATARACT EXTRACTION PHACO AND INTRAOCULAR LENS PLACEMENT (IOC);  Surgeon: Rutherford Guys, MD;  Location: [facility];  Service: Ophthalmology;  Laterality: Right;  CDE:3.81  . CATARACT EXTRACTION W/PHACO Left 12/13/2014   Procedure: CATARACT EXTRACTION PHACO AND INTRAOCULAR LENS PLACEMENT (IOC);  Surgeon: Rutherford Guys, MD;  Location: [facility];  Service: Ophthalmology;  Laterality: Left;  CDE:6.59  . CERVICAL DISC SURGERY N/A 2009   4, 6, and 7 cervical disc replaced  . COLONOSCOPY WITH PROPOFOL N/A 03/15/2016   Procedure: COLONOSCOPY WITH PROPOFOL;  Surgeon: Gatha Mayer, MD;  Location: [place];  Service: Endoscopy;  Laterality: N/A;  . ESOPHAGOGASTRODUODENOSCOPY (EGD) WITH PROPOFOL N/A 03/15/2016   Procedure: ESOPHAGOGASTRODUODENOSCOPY (EGD) WITH PROPOFOL;  Surgeon: Gatha Mayer, MD;  Location: [place];  Service: Endoscopy;  Laterality: N/A;  . HEEL SPUR SURGERY Bilateral   . HERNIA REPAIR    . I&D EXTREMITY Right 06/13/2015   Procedure: MINOR IRRIGATION AND DEBRIDEMENT EXTREMITY REMOVAL OF NAIL;  Surgeon: Daryll Brod, MD;  Location: [place];  Service: Orthopedics;  Laterality: Right;  . TUBAL LIGATION    . UMBILICAL HERNIA REPAIR     w/mesh      Current Medications: . atorvastatin  40 mg Oral q1800  . diltiazem  30 mg Oral BID  . escitalopram  10 mg Oral QHS  . insulin aspart  0-9 Units Subcutaneous Q4H  . insulin glargine  20 Units Subcutaneous Q2200  . loratadine  10 mg Oral QHS  . montelukast  10 mg Oral QHS  . pantoprazole  20 mg Oral BID AC  . sodium chloride flush  3 mL Intravenous Q12H  . warfarin  2 mg Oral Once per day on Wed Sat  . [START ON 09/08/2016] warfarin  4 mg Oral Once per day on Sun Mon Tue Thu Fri  . Warfarin - Pharmacist Dosing Inpatient   Does not apply q1800    Infused Medications:   PRN Medications: sodium chloride, ondansetron (ZOFRAN) IV, oxyCODONE-acetaminophen,  sodium chloride flush   Allergies:    Allergies  Allergen Reactions  . Xarelto [Rivaroxaban] Other (See Comments)    Internal bleeding  . Ancef [Cefazolin] Nausea And Vomiting  . Levaquin [Levofloxacin In D5w] Other (See Comments)    "afib"  . Lyrica [Pregabalin] Hives  . Tamiflu [Oseltamivir Phosphate] Other (See Comments)    "water blisters"  . Zoloft [Sertraline Hcl] Other (See Comments)    Jaw problems, jittery  . Augmentin [Amoxicillin-Pot Clavulanate] Itching  . Ciprofloxacin Itching  . Haldol [Haloperidol] Other (See Comments)    Restless leg  . Nsaids Diarrhea  . Penicillins Itching, Nausea And Vomiting and Rash    Has patient had a PCN reaction causing immediate rash, facial/tongue/throat swelling, SOB or lightheadedness with hypotension: Yes Has patient had a PCN reaction causing severe rash involving mucus membranes or skin necrosis: No Has patient  had a PCN reaction that required hospitalization No Has patient had a PCN reaction occurring within the last 10 years: Yes If all of the above answers are "NO", then may proceed with Cephalosporin use.  . Topamax [Topiramate] Nausea Only    Social History:   The patient  reports that she quit smoking about 27 years ago. Her smoking use included Cigarettes. She has a 28.00 pack-year smoking history. She has never used smokeless tobacco. She reports that she does not drink alcohol or use drugs.    Family History:   The patient's family history includes Breast cancer in her mother; COPD in her mother and sister; Diabetes in her mother and sister; Heart disease in her father, maternal grandmother, mother, and sister; Hyperlipidemia in her father.   ROS:  Please see the history of present illness.  All other ROS reviewed and negative.     Vital Signs: Blood pressure 110/78, pulse 96, temperature 98.1 F (36.7 C), temperature source Oral, resp. rate 20, height 4\' 11"  (1.499 m), weight 196 lb 6.4 oz (89.1 kg), SpO2 100 %.    PHYSICAL EXAM: General:  Well nourished, well developed, in no acute distress HEENT: normal Lymph: no adenopathy Neck: Difficult to assess JVD Endocrine:  No thryomegaly Vascular: No carotid bruits  Cardiac:  normal S1, S2; rapid irregularly irregular rhythm; no murmur  Lungs:  Decreased breath sounds with faint crackles at the bases bilaterally  Abd: soft, + R upper quadrant tenderness  Ext: Trace bilateral ankle edema Musculoskeletal:  No deformities  Skin: warm and dry  Neuro:  CNs 2-12 intact, no focal abnormalities noted Psych:  Normal affect   EKG:  Atrial fibrillation, HR 104, low voltage, PVCs versus apparently conducted beats-personally reviewed   Telemetry: Atrial fibrillation with heart rates 100-120-personally reviewed  Labs:  Recent Labs  09/06/16 2219 09/07/16 0408 09/07/16 0707  TROPONINI 0.05* 0.05* 0.04*    Recent Labs  09/06/16 1842  TROPIPOC 0.05    Lab Results  Component Value Date   WBC 12.4 (H) 09/07/2016   HGB 10.7 (L) 09/07/2016   HCT 33.7 (L) 09/07/2016   MCV 87.3 09/07/2016   PLT 255 09/07/2016    Recent Labs Lab 09/06/16 1527 09/07/16 0707  NA 136 136  K 3.5 3.4*  CL 87* 88*  CO2 32 35*  BUN 29* 32*  CREATININE 1.27* 1.27*  CALCIUM 9.0 8.9  PROT 7.1  --   BILITOT 0.4  --   ALKPHOS 65  --   ALT 15  --   AST 30  --   GLUCOSE 221* 193*   Lab Results  Component Value Date   CHOL 236 (H) 04/19/2016   HDL 37.70 (L) 04/19/2016   TRIG 258.0 (H) 04/19/2016   No results found for: DDIMER  Radiology/Studies:  Dg Chest 2 View  Result Date: 09/06/2016 CLINICAL DATA:  Shortness of breath since Wednesday. EXAM: CHEST  2 VIEW COMPARISON:  06/25/2015 FINDINGS: The heart is enlarged but stable. The mediastinal and hilar contours are within normal limits and unchanged. Mild vascular congestion and chronic bronchitic changes but no pulmonary edema, pleural effusions or focal infiltrates. IMPRESSION: Cardiac enlargement with mild  vascular congestion and chronic bronchitic changes but no infiltrates, edema or effusions. Electronically Signed   By: Marijo Sanes M.D.   On: 09/06/2016 15:59   US Abdomen Limited Ruq  Result Date: 09/06/2016 CLINICAL DATA:  Abdominal pain for 3 days EXAM: US ABDOMEN LIMITED - RIGHT UPPER QUADRANT COMPARISON:  None.  FINDINGS: Gallbladder: Multiple shadowing stones measuring up to 8.5 mm. Normal wall thickness. Negative sonographic Murphy's. Common bile duct: Diameter: Normal at 4.9 mm Liver: Coarse echogenic liver consistent with fatty infiltration. No ascites. IMPRESSION: 1. Cholelithiasis without other sonographic features to suggest acute cholecystitis. No biliary dilatation. 2. Fatty infiltration of the liver Electronically Signed   By: Donavan Foil M.D.   On: 09/06/2016 19:17    ASSESSMENT AND PLAN:  1. Acute on chronic diastolic CHF - She is typically on furosemide 80 mg twice a day as well as metolazone 2.5 mg daily. She requires further diuresis.  -  Change Lasix to 60 mg IV twice a day  -  BMET every morning 3  2. Permanent atrial fibrillation - Heart rate is uncontrolled.  She is only on diltiazem 30 mg twice a day. We'll increase this to every 6 hours.  3. Chest pain - Records indicate the patient has had multiple cardiac catheterizations in the past. Last heart catheterization in [place] in 2014 demonstrated normal coronary arteries. This was prompted by an abnormal nuclear stress test with apical ischemia. Echocardiogram is currently pending. She has had minimally elevated troponins with flat trend. This is more than likely related to demand ischemia from AF with RVR. Chest pain is likely related to volume excess. At this point, she does not appear to need ischemic evaluation.  4. Cholelithiasis - Per primary service, surgery.   5. History of GI bleed - Hemoglobin stable.  6. COPD - Per primary service.    Signed, Richardson Dopp, PA-C  09/07/2016 12:47 PM    As above, patient  seen and examined. Briefly she is a 66 year old female with past medical history of severe home O2 dependent COPD, permanent atrial fibrillation, chronic diastolic congestive heart failure, bipolar disorder, fibromyalgia who I am asked to consult on by Dr. Dallas Schimke for acute on chronic diastolic CHF. Patient has chronic dyspnea on exertion. Over the preceding 4 days she noted progressive increased dyspnea on exertion like when she is volume overloaded. There is orthopnea. She gained approximately 8 pounds. She has occasional chest pain that occurs both with exertion and at rest and she states she has had intermittently for 15 years. Cardiac catheterization at outside hospital in 2014 showed normal coronary arteries.  Last echocardiogram in December 2015 showed ejection fraction 45-50% and mild biatrial enlargement. Patient was recently evaluated for possible watchman but felt not to be a candidate. She was placed back on Coumadin which had been discontinued because of prior GI bleed. Note patient also complaining of right upper quadrant pain.  Significant laboratories include BNP 680. Hemoglobin 11.1. Troponin 0.05, 0.05 and 0.04. Creatinine 1.27. Electrocardiogram shows atrial fibrillation with PVCs or aberrantly conducted beats. Low voltage. Septal infarct. Nonspecific ST changes.  A/P  1 acute on chronic combined systolic/diastolic congestive heart failure-patient does not appear dramatically volume overloaded but her weight is up by her report and symptoms concerning. We will treat with Lasix 60 mg IV twice a day. Follow renal function and I and O's. Needs low-sodium diet and fluid restriction. Check echocardiogram.   2 Atrial fibrillation-patient's rate is mildly elevated. Change Cardizem to 30 mg every 6 hours and then transition to CD when dosing is stable. Schedule echo. CHADSvasc 6. Continue coumadin with goal INR 2-3.   3 severe COPD-continue oxygen. This is likely contributing to her  dyspnea.  4 acute on chronic stage III kidney disease-follow renal function closely with diuresis.  5 hyperlipidemia-continue statin.  6 chest  pain-troponin minimally elevated but no clear trend and not consistent with acute coronary syndrome. Previous catheterization revealed normal coronary arteries. No further ischemia evaluation.  Kirk Ruths, MD

## 2016-09-07 NOTE — Progress Notes (Signed)
Family Medicine Teaching Service Daily Progress Note Intern Pager: [phone]  Patient name: Denise Freeman [facility] Medical record number: [id number] Date of birth: Apr 06, 1951 Age: 66 y.o. Gender: female  Primary Care Provider: Darci Needle, MD Consultants: Cardiology, surgery Code Status: FULL  Pt Overview and Major Events to Date:  4/6: admission for abdominal pain and SOB 4/7: HIDA negative  Assessment and Plan: Lana Flaim is a 66 y.o. female presenting with RUQ pain and shortness of breath. 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   RUQ Abdominal Pain: Vitals stable and afebrile. CBC with mild leukocytosis to 11.8.  Limited abdominal US with cholelithiasis without other signs of cholecystitis, normal duct diameter. LFTs and lipase within normal limits. UA showing rare bacteria and moderate leukocytes as well as calcium oxylate crystals. Pain likely from symptomatic cholelithiasis. Other considerations include symptomatic nephrolithiasis as well. No symptoms of UTI. Has had a hx of appendectomy. HIDA negative today.  - Surgery recs: no abx as HIDA was negative. Continue to follow recommendations - Will hold off on treating with antibiotics at this time  - Although location of pain is atypical for kidney stones, will check renal CT as UA showing calcium oxylate crystals  Acute on Chronic Congestive Heart Failure: ECHO from 05/2014 with EF 45-50%. Likely from Afib with elevated HR. Troponins negative. consider starting home metolazone. per chart history no beta blocker due to COPD. Ace inhibitor/arb not on medication list. Was on lisinopril prior but there is also a note that states not on ace inhibitor due to CKD.  - Cardiology consulted: appreciate their recommendations  - Lasix 60 mg BID  - No ischemia work up  - echo  - Increase home diltiazem 30 mg to q 6  Atrial Fibrillation with mild  RVR: on Coumadin INR therapeutic. CHADSVaSc score 5.  - coumadin per pharmacy  - Cardizem as aboce - consult Cards as above  DM2: A1c 7.5 in 07/2016. Home: Lantus 40units at bedtime, SSI, Metformin. CBGs 198 this AM  - holding Metformin - Lantus 15 units at bedtime > increase to 20 units tonight  - CBGs with meals and before bedtime, sensitive SSI    COPD with asthma: on 3L chronically. No sign of exacerbation. Was on budesonide at some point but not in current medication list. Will need to clarify.  - continue Singulair - PRN duoneb if needed.   HLD: - continue Fenofibrate   OSA: - BiPap at night - continue Modafinil after HIDA  Vitamin D deficiency:  - continue Vit D after HIDA  Depression:  - Remeron 15mg  bedtime   Chronic Pain:  - Continue home Percocet regimen   FEN/GI: heart healthy card modified diet Prophylaxis: Lovenox   Disposition: pending management   Subjective:  Late note as patient was not seen this morning due to HIDA scan procedure. Patient states she feels about the same as admission. She notes that she continues to have right upper quadrant pain. Appetite is okay. Denies any dysuria or increased urge to urinate. Notes that her shortness of breath is the same as yesterday.  Objective: Temp:  [98.1 F (36.7 C)-99.1 F (37.3 C)] 98.1 F (36.7 C) (04/07 1212) Pulse Rate:  [72-115] 96 (04/07 1212) Resp:  [16-28] 20 (04/07 1212) BP: (100-145)/(56-99) 110/78 (04/07 1212) SpO2:  [98 %-100 %] 98 % (04/07 1247) Weight:  [196 lb 6.4 oz (89.1 kg)] 196 lb 6.4  oz (89.1 kg) (04/06 2302) Physical Exam: General: Elderly female laying in bed on left side, CPAP machine on face, no acute distress Cardiovascular: Irregular rhythm, rate controlled, no murmurs, DP pulses intact bilaterally, trace edema in bilateral lower extremities Respiratory: Normal work of breathing, CPAP machine on face, diminished sounds bilaterally, no wheezes or crackles heard Abdomen:  Distended, tender to palpation in right upper quadrant and a little in right lower quadrant, normal bowel sounds  Extremities: Trace edema bilateral lower extremities, 2+ DP pulses bilaterally   Laboratory:  Recent Labs Lab 09/06/16 1527 09/07/16 0408  WBC 11.8* 12.4*  HGB 11.1* 10.7*  HCT 35.2* 33.7*  PLT 271 255    Recent Labs Lab 09/06/16 1527 09/07/16 0707  NA 136 136  K 3.5 3.4*  CL 87* 88*  CO2 32 35*  BUN 29* 32*  CREATININE 1.27* 1.27*  CALCIUM 9.0 8.9  PROT 7.1  --   BILITOT 0.4  --   ALKPHOS 65  --   ALT 15  --   AST 30  --   GLUCOSE 221* 193*    Troponin (Point of Care Test)  Recent Labs  09/06/16 1842  TROPIPOC 0.05    Imaging/Diagnostic Tests: Nm Hepatobiliary Liver Func  Result Date: 09/07/2016 CLINICAL DATA:  Abdominal pain. EXAM: NUCLEAR MEDICINE HEPATOBILIARY IMAGING TECHNIQUE: Sequential images of the abdomen were obtained out to 60 minutes following intravenous administration of radiopharmaceutical. RADIOPHARMACEUTICALS:  5.5 mCi Tc-64m  Choletec IV COMPARISON:  None. FINDINGS: Prompt uptake and biliary excretion of activity by the liver is seen. Gallbladder activity is visualized, consistent with patency of cystic duct. Biliary activity passes into small bowel, consistent with patent common bile duct. IMPRESSION: Normal exam.  No evidence of cholecystitis or bile duct obstruction. Electronically Signed   By: Franki Cabot M.D.   On: 09/07/2016 13:19   US Abdomen Limited Ruq  Result Date: 09/06/2016 CLINICAL DATA:  Abdominal pain for 3 days EXAM: US ABDOMEN LIMITED - RIGHT UPPER QUADRANT COMPARISON:  None. FINDINGS: Gallbladder: Multiple shadowing stones measuring up to 8.5 mm. Normal wall thickness. Negative sonographic Murphy's. Common bile duct: Diameter: Normal at 4.9 mm Liver: Coarse echogenic liver consistent with fatty infiltration. No ascites. IMPRESSION: 1. Cholelithiasis without other sonographic features to suggest acute cholecystitis. No  biliary dilatation. 2. Fatty infiltration of the liver Electronically Signed   By: Donavan Foil M.D.   On: 09/06/2016 19:17     Carlyle Dolly, MD 09/07/2016, 6:09 PM PGY-2, [place] Intern pager: [phone], text pages welcome

## 2016-09-07 NOTE — Progress Notes (Addendum)
RT called to pt room about home cpap. Pt stated settings were changed, RT checked machine and settings are the same as earlier when RT checked settings. Pt machine is set at 10 low settings and a max of 16. Pt still stating that machine is not set right, in pt log machine settings are the same. Second RT in to see pt.

## 2016-09-07 NOTE — Progress Notes (Addendum)
Lasix 80mg  IV given, pt started to use home CPAP brought by her husband, RT called to take their CPAP back.

## 2016-09-07 NOTE — Progress Notes (Signed)
[facility]/Denise Freeman Progress Note    Subjective: Patient is doing okay.  Anxious to get HIDA scan done so that she can eat.    Objective: Vital signs in last 24 hours: Temp:  [98.3 F (36.8 C)-99.1 F (37.3 C)] 98.3 F (36.8 C) (04/07 0514) Pulse Rate:  [47-115] 113 (04/07 0514) Resp:  [16-28] 18 (04/07 0514) BP: (100-145)/(56-99) 145/61 (04/07 0514) SpO2:  [97 %-100 %] 98 % (04/07 0514) Weight:  [89.1 kg (196 lb 6.4 oz)-90.2 kg (198 lb 12.8 oz)] 89.1 kg (196 lb 6.4 oz) (04/06 2302) Last BM Date: 09/06/16  Intake/Output from previous day: No intake/output data recorded. Intake/Output this shift: Total I/O In: 0  Out: 300 [Urine:300]  General: No acute distress  Lungs: On oxygen and normally takes 3L at home.  No respiratory distress  Abd: Soft, excellent bowel sounds.  Mild to moderate tenderness in the RUQ.  No evidence of peritonitis  Extremities: No changes, minimal edema  Neuro: Intact  Lab Results:  @LABLAST2 (wbc:2,hgb:2,hct:2,plt:2) BMET ) Recent Labs  09/06/16 1527 09/07/16 0707  NA 136 136  K 3.5 3.4*  CL 87* 88*  CO2 32 35*  GLUCOSE 221* 193*  BUN 29* 32*  CREATININE 1.27* 1.27*  CALCIUM 9.0 8.9   PT/INR  Recent Labs  09/06/16 1539 09/07/16 0408  LABPROT 28.9* 28.8*  INR 2.66 2.65   ABG No results for input(s): PHART, HCO3 in the last 72 hours.  Invalid input(s): PCO2, PO2  Studies/Results: Dg Chest 2 View  Result Date: 09/06/2016 CLINICAL DATA:  Shortness of breath since Wednesday. EXAM: CHEST  2 VIEW COMPARISON:  06/25/2015 FINDINGS: The heart is enlarged but stable. The mediastinal and hilar contours are within normal limits and unchanged. Mild vascular congestion and chronic bronchitic changes but no pulmonary edema, pleural effusions or focal infiltrates. IMPRESSION: Cardiac enlargement with mild vascular congestion and chronic bronchitic changes but no infiltrates, edema or effusions. Electronically Signed   By: Marijo Sanes M.D.   On:  09/06/2016 15:59   US Abdomen Limited Ruq  Result Date: 09/06/2016 CLINICAL DATA:  Abdominal pain for 3 days EXAM: US ABDOMEN LIMITED - RIGHT UPPER QUADRANT COMPARISON:  None. FINDINGS: Gallbladder: Multiple shadowing stones measuring up to 8.5 mm. Normal wall thickness. Negative sonographic Murphy's. Common bile duct: Diameter: Normal at 4.9 mm Liver: Coarse echogenic liver consistent with fatty infiltration. No ascites. IMPRESSION: 1. Cholelithiasis without other sonographic features to suggest acute cholecystitis. No biliary dilatation. 2. Fatty infiltration of the liver Electronically Signed   By: Donavan Foil M.D.   On: 09/06/2016 19:17    Anti-infectives: Anti-infectives    None      Assessment/Plan: s/p  Patient has not had her HIDA scan yet.  If HIDA is positive (no cystic ductal drainage) I would just put the patient on IV antibiotics since she is not septic or very sick from the disease. If HIDA is negative, would not put the patient on antibiotics. Would not consider percutaneous drain unless the patientbecame febrile, had increasing WBC or showed other signs of SIRS or sepsis.  LOS: 1 day   Kathryne Eriksson. Dahlia Bailiff, MD, FACS [phone] [facility] Surgery 09/07/2016

## 2016-09-07 NOTE — Progress Notes (Signed)
ANTICOAGULATION CONSULT NOTE - Follow-up Consult  Pharmacy Consult for Coumadin Indication: atrial fibrillation  Allergies  Allergen Reactions  . Xarelto [Rivaroxaban] Other (See Comments)    Internal bleeding  . Ancef [Cefazolin] Nausea And Vomiting  . Levaquin [Levofloxacin In D5w] Other (See Comments)    "afib"  . Lyrica [Pregabalin] Hives  . Tamiflu [Oseltamivir Phosphate] Other (See Comments)    "water blisters"  . Zoloft [Sertraline Hcl] Other (See Comments)    Jaw problems, jittery  . Augmentin [Amoxicillin-Pot Clavulanate] Itching  . Ciprofloxacin Itching  . Haldol [Haloperidol] Other (See Comments)    Restless leg  . Nsaids Diarrhea  . Penicillins Itching, Nausea And Vomiting and Rash    Has patient had a PCN reaction causing immediate rash, facial/tongue/throat swelling, SOB or lightheadedness with hypotension: Yes Has patient had a PCN reaction causing severe rash involving mucus membranes or skin necrosis: No Has patient had a PCN reaction that required hospitalization No Has patient had a PCN reaction occurring within the last 10 years: Yes If all of the above answers are "NO", then may proceed with Cephalosporin use.  . Topamax [Topiramate] Nausea Only    Patient Measurements: Height: 4\' 11"  (149.9 cm) Weight: 196 lb 6.4 oz (89.1 kg) (scale b) IBW/kg (Calculated) : 43.2  Vital Signs: Temp: 98.3 F (36.8 C) (04/07 0514) Temp Source: Oral (04/07 0514) BP: 145/61 (04/07 0514) Pulse Rate: 113 (04/07 0514)  Labs:  Recent Labs  09/04/16 1448 09/06/16 1527 09/06/16 1539 09/06/16 2219 09/07/16 0408 09/07/16 0707  HGB  --  11.1*  --   --  10.7*  --   HCT  --  35.2*  --   --  33.7*  --   PLT  --  271  --   --  255  --   LABPROT  --   --  28.9*  --  28.8*  --   INR 3.6  --  2.66  --  2.65  --   CREATININE  --  1.27*  --   --   --  1.27*  TROPONINI  --   --   --  0.05* 0.05* 0.04*    Estimated Creatinine Clearance: 42.9 mL/min (A) (by C-G formula based  on SCr of 1.27 mg/dL (H)).   Medical History: Past Medical History:  Diagnosis Date  . Anemia   . Anxiety   . Asthma   . Atrial fibrillation ([place])   . Bipolar 1 disorder ([place])   . 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 CHF (congestive heart failure) ([place])   . Chronic bronchitis ([place])   . Colon polyps   . COPD (chronic obstructive pulmonary disease) ([place])   . Depression   . Family history of adverse reaction to anesthesia    Uncle was positive for malignant hyperthermia; patient had testing done and was negative.  . Fibromyalgia   . GERD (gastroesophageal reflux disease)   . Gout   . High cholesterol   . History of blood transfusion 06/2015   "bleeding from my rectum"  . History of hiatal hernia   . HOH (hard of hearing)   . Hx of colonic polyps 03/21/2016   3 small adenomas no recall - co-morbidities  . 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 On home oxygen therapy    "3L; 24/7" (03/14/2016)  . OSA treated with BiPAP    uses biPAP, 10 (03/14/2016)  . Osteoarthritis   . Pneumonia   .  Type II diabetes mellitus (HCC)     Assessment: 66 y.o. F presents with abd pain and SOB. Per MD, unlikely PE d/t PTA coumadin for afib. INR therapeutic on admission (2.66). INR today continues to be therapeutic at 2.65. CBC stable. No s/s bleeding.   Home dose: 4 mg daily except for 2mg  on Wed and Sat  Goal of Therapy:  INR 2-3 Monitor platelets by anticoagulation protocol: Yes   Plan:  Continue home dose of coumadin 4mg  daily except for 2mg  on Wed and Sat Daily INR, CBC as needed Monitor for s/s bleeding   Argie Ramming, PharmD Pharmacy Resident  Pager [phone] 09/07/16 10:52 AM

## 2016-09-08 DIAGNOSIS — I482 Chronic atrial fibrillation: Secondary | ICD-10-CM

## 2016-09-08 DIAGNOSIS — J441 Chronic obstructive pulmonary disease with (acute) exacerbation: Secondary | ICD-10-CM

## 2016-09-08 DIAGNOSIS — Z6838 Body mass index (BMI) 38.0-38.9, adult: Secondary | ICD-10-CM

## 2016-09-08 DIAGNOSIS — R1011 Right upper quadrant pain: Secondary | ICD-10-CM

## 2016-09-08 DIAGNOSIS — R109 Unspecified abdominal pain: Secondary | ICD-10-CM

## 2016-09-08 LAB — BASIC METABOLIC PANEL
Anion gap: 13 (ref 5–15)
BUN: 33 mg/dL — ABNORMAL HIGH (ref 6–20)
CHLORIDE: 91 mmol/L — AB (ref 101–111)
CO2: 35 mmol/L — AB (ref 22–32)
Calcium: 9 mg/dL (ref 8.9–10.3)
Creatinine, Ser: 1.37 mg/dL — ABNORMAL HIGH (ref 0.44–1.00)
GFR calc Af Amer: 46 mL/min — ABNORMAL LOW (ref >60)
GFR calc non Af Amer: 40 mL/min — ABNORMAL LOW (ref >60)
GLUCOSE: 204 mg/dL — AB (ref 65–99)
POTASSIUM: 3.2 mmol/L — AB (ref 3.5–5.1)
Sodium: 139 mmol/L (ref 135–145)

## 2016-09-08 LAB — GLUCOSE, CAPILLARY
GLUCOSE-CAPILLARY: 200 mg/dL — AB (ref 65–99)
GLUCOSE-CAPILLARY: 187 mg/dL — AB (ref 65–99)
Glucose-Capillary: 219 mg/dL — ABNORMAL HIGH (ref 65–99)
Glucose-Capillary: 215 mg/dL — ABNORMAL HIGH (ref 65–99)

## 2016-09-08 LAB — CBC
HEMATOCRIT: 33.2 % — AB (ref 36.0–46.0)
HEMOGLOBIN: 10.5 g/dL — AB (ref 12.0–15.0)
MCH: 27.6 pg (ref 26.0–34.0)
MCHC: 31.6 g/dL (ref 30.0–36.0)
MCV: 87.4 fL (ref 78.0–100.0)
Platelets: 263 10*3/uL (ref 150–400)
RBC: 3.8 MIL/uL — AB (ref 3.87–5.11)
RDW: 12.2 % (ref 11.5–15.5)
WBC: 11.4 10*3/uL — ABNORMAL HIGH (ref 4.0–10.5)

## 2016-09-08 LAB — PROTIME-INR
INR: 2.42
PROTHROMBIN TIME: 26.8 s — AB (ref 11.4–15.2)

## 2016-09-08 MED ORDER — MODAFINIL 100 MG PO TABS
200.0000 mg | ORAL_TABLET | Freq: Every day | ORAL | Status: DC
  Administered 2016-09-08 – 2016-09-13 (×6): 200 mg via ORAL
  Filled 2016-09-08 (×6): qty 2

## 2016-09-08 MED ORDER — IPRATROPIUM-ALBUTEROL 0.5-2.5 (3) MG/3ML IN SOLN: 3.0000 mL | Freq: Four times a day (QID) | RESPIRATORY_TRACT | Status: DC | PRN

## 2016-09-08 MED ORDER — TIOTROPIUM BROMIDE MONOHYDRATE 18 MCG IN CAPS
18.0000 ug | ORAL_CAPSULE | Freq: Every day | RESPIRATORY_TRACT | Status: DC
  Administered 2016-09-08 – 2016-09-09 (×2): 18 ug via RESPIRATORY_TRACT
  Filled 2016-09-08: qty 5

## 2016-09-08 MED ORDER — POTASSIUM CHLORIDE CRYS ER 20 MEQ PO TBCR
40.0000 meq | EXTENDED_RELEASE_TABLET | Freq: Once | ORAL | Status: AC
  Administered 2016-09-08: 40 meq via ORAL

## 2016-09-08 MED ORDER — INSULIN GLARGINE 100 UNIT/ML ~~LOC~~ SOLN
25.0000 [IU] | Freq: Every day | SUBCUTANEOUS | Status: DC
  Administered 2016-09-08 – 2016-09-10 (×3): 25 [IU] via SUBCUTANEOUS
  Filled 2016-09-08 (×3): qty 0.25

## 2016-09-08 MED ORDER — DILTIAZEM HCL ER COATED BEADS 120 MG PO CP24
120.0000 mg | ORAL_CAPSULE | Freq: Every day | ORAL | Status: DC
  Administered 2016-09-08 – 2016-09-09 (×2): 120 mg via ORAL
  Filled 2016-09-08 (×2): qty 1

## 2016-09-08 MED ORDER — PREDNISONE 50 MG PO TABS
50.0000 mg | ORAL_TABLET | Freq: Every day | ORAL | Status: DC
  Administered 2016-09-09: 50 mg via ORAL
  Filled 2016-09-08: qty 1

## 2016-09-08 NOTE — Progress Notes (Signed)
Progress Note  Patient Name: Denise Freeman [facility] Date of Encounter: 09/08/2016  Primary Cardiologist: Dr Debara Pickett  Subjective   Pt complains of dyspnea and RUQ pain; no chest pain  Inpatient Medications    Scheduled Meds: . atorvastatin  40 mg Oral q1800  . diltiazem  30 mg Oral Q6H  . escitalopram  10 mg Oral QHS  . fenofibrate  54 mg Oral Daily  . furosemide  60 mg Intravenous BID  . insulin aspart  0-9 Units Subcutaneous TID WC  . insulin glargine  20 Units Subcutaneous Q2200  . loratadine  10 mg Oral QHS  . mirtazapine  15 mg Oral QHS  . montelukast  10 mg Oral QHS  . pantoprazole  20 mg Oral BID AC  . sodium chloride flush  3 mL Intravenous Q12H  . warfarin  4 mg Oral Once per day on Sun Mon Tue Thu Fri  . Warfarin - Pharmacist Dosing Inpatient   Does not apply q1800   Continuous Infusions:  PRN Meds: sodium chloride, ondansetron (ZOFRAN) IV, oxyCODONE-acetaminophen, sodium chloride flush   Vital Signs    Vitals:   09/07/16 1212 09/07/16 1247 09/07/16 2017 09/08/16 0618  BP: 110/78  116/79 108/81  Pulse: 96  74 80  Resp: 20  18 18   Temp: 98.1 F (36.7 C)  98.6 F (37 C) 98.4 F (36.9 C)  TempSrc: Oral  Oral Oral  SpO2: 100% 98% 94% 96%  Weight:    193 lb 3.2 oz (87.6 kg)  Height:        Intake/Output Summary (Last 24 hours) at 09/08/16 0831 Last data filed at 09/08/16 0500  Gross per 24 hour  Intake              720 ml  Output             1650 ml  Net             -930 ml   Filed Weights   09/06/16 2302 09/08/16 0618  Weight: 196 lb 6.4 oz (89.1 kg) 193 lb 3.2 oz (87.6 kg)    Telemetry    Atrial fibrillation with PVCs or aberrantly conducted beats; rate controlled- Personally Reviewed   Physical Exam   GEN: WD; dyspneic Neck: supple Cardiac: irregular Respiratory: diminished BS throughout; exp wheeze GI: Soft, mild tenderness; mildly distended MS: No edema; No deformity. Neuro:  Nonfocal  Psych: Normal affect   Labs     Chemistry Recent Labs Lab 09/06/16 1527 09/07/16 0707 09/08/16 0242  NA 136 136 139  K 3.5 3.4* 3.2*  CL 87* 88* 91*  CO2 32 35* 35*  GLUCOSE 221* 193* 204*  BUN 29* 32* 33*  CREATININE 1.27* 1.27* 1.37*  CALCIUM 9.0 8.9 9.0  PROT 7.1  --   --   ALBUMIN 4.1  --   --   AST 30  --   --   ALT 15  --   --   ALKPHOS 65  --   --   BILITOT 0.4  --   --   GFRNONAA 43* 43* 40*  GFRAA 50* 50* 46*  ANIONGAP 17* 13 13     Hematology Recent Labs Lab 09/06/16 1527 09/07/16 0408 09/08/16 0242  WBC 11.8* 12.4* 11.4*  RBC 4.05 3.86* 3.80*  HGB 11.1* 10.7* 10.5*  HCT 35.2* 33.7* 33.2*  MCV 86.9 87.3 87.4  MCH 27.4 27.7 27.6  MCHC 31.5 31.8 31.6  RDW 12.0 12.2 12.2  PLT 271  255 263    Cardiac Enzymes Recent Labs Lab 09/06/16 2219 09/07/16 0408 09/07/16 0707  TROPONINI 0.05* 0.05* 0.04*    Recent Labs Lab 09/06/16 1842  TROPIPOC 0.05     BNP Recent Labs Lab 09/06/16 1756  BNP 680.4*       Radiology    Dg Chest 2 View  Result Date: 09/06/2016 CLINICAL DATA:  Shortness of breath since Wednesday. EXAM: CHEST  2 VIEW COMPARISON:  06/25/2015 FINDINGS: The heart is enlarged but stable. The mediastinal and hilar contours are within normal limits and unchanged. Mild vascular congestion and chronic bronchitic changes but no pulmonary edema, pleural effusions or focal infiltrates. IMPRESSION: Cardiac enlargement with mild vascular congestion and chronic bronchitic changes but no infiltrates, edema or effusions. Electronically Signed   By: Marijo Sanes M.D.   On: 09/06/2016 15:59   Nm Hepatobiliary Liver Func  Result Date: 09/07/2016 CLINICAL DATA:  Abdominal pain. EXAM: NUCLEAR MEDICINE HEPATOBILIARY IMAGING TECHNIQUE: Sequential images of the abdomen were obtained out to 60 minutes following intravenous administration of radiopharmaceutical. RADIOPHARMACEUTICALS:  5.5 mCi Tc-29m  Choletec IV COMPARISON:  None. FINDINGS: Prompt uptake and biliary excretion of activity by  the liver is seen. Gallbladder activity is visualized, consistent with patency of cystic duct. Biliary activity passes into small bowel, consistent with patent common bile duct. IMPRESSION: Normal exam.  No evidence of cholecystitis or bile duct obstruction. Electronically Signed   By: Franki Cabot M.D.   On: 09/07/2016 13:19   Ct Renal Stone Study  Result Date: 09/07/2016 CLINICAL DATA:  Right upper quadrant pain. HIDA scan negative. Rare bacteria on urinalysis. EXAM: CT ABDOMEN AND PELVIS WITHOUT CONTRAST TECHNIQUE: Multidetector CT imaging of the abdomen and pelvis was performed following the standard protocol without IV contrast. COMPARISON:  Chest CT 03/12/2014, right upper quadrant ultrasound 09/06/2016 and HIDA scan 09/07/2016. FINDINGS: Lower chest: Lung bases are within normal.  Mild cardiomegaly. Hepatobiliary: Liver and biliary tree are within normal. There is mild cholelithiasis present. Subtle nonspecific hazy attenuation within the fat adjacent over the porta hepatis. Pancreas: Normal. Spleen: Normal. Adrenals/Urinary Tract: Adrenal glands are normal. Kidneys are normal in size without hydronephrosis or nephrolithiasis. Ureters and bladder are normal. Stomach/Bowel: Stomach and small bowel are normal. Prior appendectomy. Colon is within normal. Vascular/Lymphatic: Minimal calcified plaque over the abdominal aorta and iliac arteries. Remaining vascular structures are within normal. No adenopathy. Reproductive: Within normal. Other: No free fluid.  No free peritoneal air. Musculoskeletal: Mild degenerate change of the spine and hips. IMPRESSION: Cholelithiasis. Subtle nonspecific hazy attenuation of the fat adjacent the porta hepatis which may be seen with cholecystitis or pancreatitis. Minimal aortic atherosclerosis. Electronically Signed   By: Marin Olp M.D.   On: 09/07/2016 20:21   US Abdomen Limited Ruq  Result Date: 09/06/2016 CLINICAL DATA:  Abdominal pain for 3 days EXAM: US ABDOMEN  LIMITED - RIGHT UPPER QUADRANT COMPARISON:  None. FINDINGS: Gallbladder: Multiple shadowing stones measuring up to 8.5 mm. Normal wall thickness. Negative sonographic Murphy's. Common bile duct: Diameter: Normal at 4.9 mm Liver: Coarse echogenic liver consistent with fatty infiltration. No ascites. IMPRESSION: 1. Cholelithiasis without other sonographic features to suggest acute cholecystitis. No biliary dilatation. 2. Fatty infiltration of the liver Electronically Signed   By: Donavan Foil M.D.   On: 09/06/2016 19:17    Patient Profile     66 year old female with past medical history of severe home O2 dependent COPD, permanent atrial fibrillation, chronic diastolic congestive heart failure, bipolar disorder, fibromyalgia with acute  on chronic diastolic CHF. Cath 10/14 with normal coronaries. Echo this admission shows EF 30-35 (worse compared to previous) with anteroseptal and apical hypokinesis; RAE and RVE.  Assessment & Plan    1 acute on chronic combined systolic/diastolic congestive heart failure-Patient remains dyspneic. Difficult to know whether this is predominantly congestive heart failure versus progressive lung disease. She does not appear to be markedly volume overloaded on examination. Continue present dose of Lasix. Follow renal function.   2 Atrial fibrillation-patient's rate is controlled. Ideally given reduced LV function I would change Cardizem to beta-blockade. However she has severe lung disease with expiratory wheeze. We will therefore continue with calcium blocker. CHADSvasc 6. Continue coumadin with goal INR 2-3.   3 severe COPD-continue oxygen. This is likely contributing to her dyspnea. Needs aggressive pulmonary toilet. Management per primary care.  4 acute on chronic stage III kidney disease-follow renal function closely with diuresis.  5 hyperlipidemia-continue statin.  6 chest pain-troponin minimally elevated but no clear trend and not consistent with acute  coronary syndrome. Previous catheterization revealed normal coronary arteries.  7 Cardiomyopathy-LV function is nearly reduced compared to previous. Etiology unclear. Previous catheterization October 2014 showed normal coronary arteries. Question tachycardia mediated from atrial fibrillation. She has severe lung disease and would not be a candidate for coronary artery bypass and graft. Has her congestive heart failure improves we will arrange a [place] nuclear study to screen for significant ischemia. If she is noted to have severe ischemia we could proceed with catheterization to see if there would be a lesion amenable to PCI. I have not added a beta blocker given baseline lung disease and expiratory wheezing. Blood pressure is borderline and we will therefore not add an ACE inhibitor at this point but could be considered later.   Signed, Kirk Ruths, MD  09/08/2016, 8:31 AM

## 2016-09-08 NOTE — Progress Notes (Signed)
Pt is stable during AM shift, no any complication of SOB and distress, no any complain of pain, tolerating medicine and diet, using BIPAP on and off from home, will continue to monitor the patient

## 2016-09-08 NOTE — Progress Notes (Addendum)
ANTICOAGULATION CONSULT NOTE - Follow-up Consult  Pharmacy Consult for Coumadin Indication: atrial fibrillation  Allergies  Allergen Reactions  . Xarelto [Rivaroxaban] Other (See Comments)    Internal bleeding  . Ancef [Cefazolin] Nausea And Vomiting  . Levaquin [Levofloxacin In D5w] Other (See Comments)    "afib"  . Lyrica [Pregabalin] Hives  . Tamiflu [Oseltamivir Phosphate] Other (See Comments)    "water blisters"  . Zoloft [Sertraline Hcl] Other (See Comments)    Jaw problems, jittery  . Augmentin [Amoxicillin-Pot Clavulanate] Itching  . Ciprofloxacin Itching  . Haldol [Haloperidol] Other (See Comments)    Restless leg  . Nsaids Diarrhea  . Penicillins Itching, Nausea And Vomiting and Rash    Has patient had a PCN reaction causing immediate rash, facial/tongue/throat swelling, SOB or lightheadedness with hypotension: Yes Has patient had a PCN reaction causing severe rash involving mucus membranes or skin necrosis: No Has patient had a PCN reaction that required hospitalization No Has patient had a PCN reaction occurring within the last 10 years: Yes If all of the above answers are "NO", then may proceed with Cephalosporin use.  . Topamax [Topiramate] Nausea Only    Patient Measurements: Height: 4\' 11"  (149.9 cm) Weight: 193 lb 3.2 oz (87.6 kg) (scale b) IBW/kg (Calculated) : 43.2  Vital Signs: Temp: 98.4 F (36.9 C) (04/08 0618) Temp Source: Oral (04/08 0618) BP: 108/81 (04/08 0618) Pulse Rate: 80 (04/08 0618)  Labs:  Recent Labs  09/06/16 1527 09/06/16 1539 09/06/16 2219 09/07/16 0408 09/07/16 0707 09/08/16 0242  HGB 11.1*  --   --  10.7*  --  10.5*  HCT 35.2*  --   --  33.7*  --  33.2*  PLT 271  --   --  255  --  263  LABPROT  --  28.9*  --  28.8*  --  26.8*  INR  --  2.66  --  2.65  --  2.42  CREATININE 1.27*  --   --   --  1.27* 1.37*  TROPONINI  --   --  0.05* 0.05* 0.04*  --     Estimated Creatinine Clearance: 39.4 mL/min (A) (by C-G formula  based on SCr of 1.37 mg/dL (H)).   Medical History: Past Medical History:  Diagnosis Date  . Anemia   . Anxiety   . Asthma   . Atrial fibrillation ([place])   . Bipolar 1 disorder ([place])   . 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 CHF (congestive heart failure) ([place])   . Chronic bronchitis ([place])   . Colon polyps   . COPD (chronic obstructive pulmonary disease) ([place])   . Depression   . Family history of adverse reaction to anesthesia    Uncle was positive for malignant hyperthermia; patient had testing done and was negative.  . Fibromyalgia   . GERD (gastroesophageal reflux disease)   . Gout   . High cholesterol   . History of blood transfusion 06/2015   "bleeding from my rectum"  . History of hiatal hernia   . HOH (hard of hearing)   . Hx of colonic polyps 03/21/2016   3 small adenomas no recall - co-morbidities  . 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 On home oxygen therapy    "3L; 24/7" (03/14/2016)  . OSA treated with BiPAP    uses biPAP, 10 (03/14/2016)  . Osteoarthritis   . Pneumonia   . Type II diabetes mellitus ([place])  Assessment: 66 y.o. F presents with abd pain and SOB. Per MD, unlikely PE d/t PTA coumadin for afib. Cards consulted and believe SOB is r/t CHF vs lung disease. INR therapeutic on admission (2.66). INR today continues to be therapeutic at 2.42. CBC stable. No s/s bleeding.   Home dose: 4 mg daily except for 2mg  on Wed and Sat  Goal of Therapy:  INR 2-3 Monitor platelets by anticoagulation protocol: Yes   Plan:  Warfarin 4mg  PO x1 today  Daily INR, CBC as needed Monitor for s/s bleeding   Denise Freeman, PharmD Pharmacy Resident  Pager [phone] 09/08/16 9:31 AM

## 2016-09-08 NOTE — Discharge Instructions (Addendum)
Denise Freeman,  You were treated for shortness of breath and cholecystitis (gallbladder infection). Your medications for atrial fibrillation were changed-- you now should be taking toprol 25 mg twice daily. Cardizem was stopped. Continue coumadin on normal schedule.   Your kidneys became a little stressed during this hospitalization, so your fluid pills were changed. Please take lasix 40 mg twice daily. Stop metolazone and the potassium supplement. You will likely need to restart the potassium supplement, but you will need your labs rechecked this upcoming week first.  Your blood sugar has been controlled with less insulin since you have not been able to eat as much. You have been discharged on what you were needing in the hospital (lantus 25 units nightly compared to 40 units twice daily), but you will likely need to increase this over the next couple of weeks. Please keep a close blood sugar log to help you make these adjustments at follow-up.  Thank you for letting us take part in your care.    Information on my medicine - Coumadin   (Warfarin)  This medication education was reviewed with me or my healthcare representative as part of my discharge preparation.    Why was Coumadin prescribed for you? Coumadin was prescribed for you because you have a blood clot or a medical condition that can cause an increased risk of forming blood clots. Blood clots can cause serious health problems by blocking the flow of blood to the heart, lung, or brain. Coumadin can prevent harmful blood clots from forming.   What test will check on my response to Coumadin? While on Coumadin (warfarin) you will need to have an INR test regularly to ensure that your dose is keeping you in the desired range. The INR (international normalized ratio) number is calculated from the result of the laboratory test called prothrombin time (PT).  If an INR APPOINTMENT HAS NOT ALREADY BEEN MADE FOR YOU please schedule an appointment  to have this lab work done by your health care provider within 7 days. Your INR goal is usually a number between:  2 to 3 or your provider may give you a more narrow range like 2-2.5.  Ask your health care provider during an office visit what your goal INR is.  What  do you need to  know  About  COUMADIN? Take Coumadin (warfarin) exactly as prescribed by your healthcare provider about the same time each day.  DO NOT stop taking without talking to the doctor who prescribed the medication.  Stopping without other blood clot prevention medication to take the place of Coumadin may increase your risk of developing a new clot or stroke.  Get refills before you run out.  What do you do if you miss a dose? If you miss a dose, take it as soon as you remember on the same day then continue your regularly scheduled regimen the next day.  Do not take two doses of Coumadin at the same time.  Important Safety Information A possible side effect of Coumadin (Warfarin) is an increased risk of bleeding. You should call your healthcare provider right away if you experience any of the following: ? Bleeding from an injury or your nose that does not stop. ? Unusual colored urine (red or dark brown) or unusual colored stools (red or black). ? Unusual bruising for unknown reasons. ? A serious fall or if you hit your head (even if there is no bleeding).  Some foods or medicines interact with Coumadin (warfarin) and  might alter your response to warfarin. To help avoid this: ? Eat a balanced diet, maintaining a consistent amount of Vitamin K. ? Notify your provider about major diet changes you plan to make. ? Avoid alcohol or limit your intake to 1 drink for women and 2 drinks for men per day. (1 drink is 5 oz. wine, 12 oz. beer, or 1.5 oz. liquor.)  Make sure that ANY health care provider who prescribes medication for you knows that you are taking Coumadin (warfarin).  Also make sure the healthcare provider who is  monitoring your Coumadin knows when you have started a new medication including herbals and non-prescription products.  Coumadin (Warfarin)  Major Drug Interactions  Increased Warfarin Effect Decreased Warfarin Effect  Alcohol (large quantities) Antibiotics (esp. Septra/Bactrim, Flagyl, Cipro) Amiodarone (Cordarone) Aspirin (ASA) Cimetidine (Tagamet) Megestrol (Megace) NSAIDs (ibuprofen, naproxen, etc.) Piroxicam (Feldene) Propafenone (Rythmol SR) Propranolol (Inderal) Isoniazid (INH) Posaconazole (Noxafil) Barbiturates (Phenobarbital) Carbamazepine (Tegretol) Chlordiazepoxide (Librium) Cholestyramine (Questran) Griseofulvin Oral Contraceptives Rifampin Sucralfate (Carafate) Vitamin K   Coumadin (Warfarin) Major Herbal Interactions  Increased Warfarin Effect Decreased Warfarin Effect  Garlic Ginseng Ginkgo biloba Coenzyme Q10 Green tea St. Johns wort    Coumadin (Warfarin) FOOD Interactions  Eat a consistent number of servings per week of foods HIGH in Vitamin K (1 serving =  cup)  Collards (cooked, or boiled & drained) Kale (cooked, or boiled & drained) Mustard greens (cooked, or boiled & drained) Parsley *serving size only =  cup Spinach (cooked, or boiled & drained) Swiss chard (cooked, or boiled & drained) Turnip greens (cooked, or boiled & drained)  Eat a consistent number of servings per week of foods MEDIUM-HIGH in Vitamin K (1 serving = 1 cup)  Asparagus (cooked, or boiled & drained) Broccoli (cooked, boiled & drained, or raw & chopped) Brussel sprouts (cooked, or boiled & drained) *serving size only =  cup Lettuce, raw (green leaf, endive, romaine) Spinach, raw Turnip greens, raw & chopped   These websites have more information on Coumadin (warfarin):  FailFactory.se; VeganReport.com.au;

## 2016-09-08 NOTE — Progress Notes (Signed)
Subjective: Pt complains of   Right upper quadrant pain  Better than at admission   Very SOB   Objective: Vital signs in last 24 hours: Temp:  [98.1 F (36.7 C)-98.6 F (37 C)] 98.4 F (36.9 C) (04/08 0618) Pulse Rate:  [74-96] 80 (04/08 0618) Resp:  [18-20] 18 (04/08 0618) BP: (108-116)/(78-81) 108/81 (04/08 0618) SpO2:  [94 %-100 %] 96 % (04/08 0618) Weight:  [87.6 kg (193 lb 3.2 oz)] 87.6 kg (193 lb 3.2 oz) (04/08 0618) Last BM Date: 09/07/16  Intake/Output from previous day: 04/07 0701 - 04/08 0700 In: 720 [P.O.:720] Out: 1650 [Urine:1650] Intake/Output this shift: No intake/output data recorded.  General appearance: alert, cooperative and appears older than stated age Resp: wheezes bilaterally and using accessory muscles  Cardio: regularly irregular rhythm and a fib  rate in the 90 's  GI: minimal tenderness RUQ   Lab Results:   Recent Labs  09/07/16 0408 09/08/16 0242  WBC 12.4* 11.4*  HGB 10.7* 10.5*  HCT 33.7* 33.2*  PLT 255 263   BMET  Recent Labs  09/07/16 0707 09/08/16 0242  NA 136 139  K 3.4* 3.2*  CL 88* 91*  CO2 35* 35*  GLUCOSE 193* 204*  BUN 32* 33*  CREATININE 1.27* 1.37*  CALCIUM 8.9 9.0   PT/INR  Recent Labs  09/07/16 0408 09/08/16 0242  LABPROT 28.8* 26.8*  INR 2.65 2.42   ABG No results for input(s): PHART, HCO3 in the last 72 hours.  Invalid input(s): PCO2, PO2  Studies/Results: Dg Chest 2 View  Result Date: 09/06/2016 CLINICAL DATA:  Shortness of breath since Wednesday. EXAM: CHEST  2 VIEW COMPARISON:  06/25/2015 FINDINGS: The heart is enlarged but stable. The mediastinal and hilar contours are within normal limits and unchanged. Mild vascular congestion and chronic bronchitic changes but no pulmonary edema, pleural effusions or focal infiltrates. IMPRESSION: Cardiac enlargement with mild vascular congestion and chronic bronchitic changes but no infiltrates, edema or effusions. Electronically Signed   By: Marijo Sanes M.D.   On: 09/06/2016 15:59   Nm Hepatobiliary Liver Func  Result Date: 09/07/2016 CLINICAL DATA:  Abdominal pain. EXAM: NUCLEAR MEDICINE HEPATOBILIARY IMAGING TECHNIQUE: Sequential images of the abdomen were obtained out to 60 minutes following intravenous administration of radiopharmaceutical. RADIOPHARMACEUTICALS:  5.5 mCi Tc-78m  Choletec IV COMPARISON:  None. FINDINGS: Prompt uptake and biliary excretion of activity by the liver is seen. Gallbladder activity is visualized, consistent with patency of cystic duct. Biliary activity passes into small bowel, consistent with patent common bile duct. IMPRESSION: Normal exam.  No evidence of cholecystitis or bile duct obstruction. Electronically Signed   By: Franki Cabot M.D.   On: 09/07/2016 13:19   Ct Renal Stone Study  Result Date: 09/07/2016 CLINICAL DATA:  Right upper quadrant pain. HIDA scan negative. Rare bacteria on urinalysis. EXAM: CT ABDOMEN AND PELVIS WITHOUT CONTRAST TECHNIQUE: Multidetector CT imaging of the abdomen and pelvis was performed following the standard protocol without IV contrast. COMPARISON:  Chest CT 03/12/2014, right upper quadrant ultrasound 09/06/2016 and HIDA scan 09/07/2016. FINDINGS: Lower chest: Lung bases are within normal.  Mild cardiomegaly. Hepatobiliary: Liver and biliary tree are within normal. There is mild cholelithiasis present. Subtle nonspecific hazy attenuation within the fat adjacent over the porta hepatis. Pancreas: Normal. Spleen: Normal. Adrenals/Urinary Tract: Adrenal glands are normal. Kidneys are normal in size without hydronephrosis or nephrolithiasis. Ureters and bladder are normal. Stomach/Bowel: Stomach and small bowel are normal. Prior appendectomy. Colon is within normal. Vascular/Lymphatic: Minimal calcified  plaque over the abdominal aorta and iliac arteries. Remaining vascular structures are within normal. No adenopathy. Reproductive: Within normal. Other: No free fluid.  No free peritoneal  air. Musculoskeletal: Mild degenerate change of the spine and hips. IMPRESSION: Cholelithiasis. Subtle nonspecific hazy attenuation of the fat adjacent the porta hepatis which may be seen with cholecystitis or pancreatitis. Minimal aortic atherosclerosis. Electronically Signed   By: Marin Olp M.D.   On: 09/07/2016 20:21   US Abdomen Limited Ruq  Result Date: 09/06/2016 CLINICAL DATA:  Abdominal pain for 3 days EXAM: US ABDOMEN LIMITED - RIGHT UPPER QUADRANT COMPARISON:  None. FINDINGS: Gallbladder: Multiple shadowing stones measuring up to 8.5 mm. Normal wall thickness. Negative sonographic Murphy's. Common bile duct: Diameter: Normal at 4.9 mm Liver: Coarse echogenic liver consistent with fatty infiltration. No ascites. IMPRESSION: 1. Cholelithiasis without other sonographic features to suggest acute cholecystitis. No biliary dilatation. 2. Fatty infiltration of the liver Electronically Signed   By: Donavan Foil M.D.   On: 09/06/2016 19:17    Anti-infectives: Anti-infectives    None      Assessment/Plan: Abdominal pain  Gallstones with normal HIDA   Very poor operative candidate   Treat for now medically  No acute intervention needed for gallbladder  Perc drain an option if she fails medical management   Ok to feed   Patient Active Problem List   Diagnosis Date Noted  . Acute systolic congestive heart failure ([place])   . Symptomatic cholelithiasis   . RUQ pain 09/06/2016  . Heart failure ([place]) 09/06/2016  . Dyslipidemia 08/07/2016  . Vision loss 04/19/2016  . Hx of colonic polyps 03/21/2016  . Blood in stool   . Benign neoplasm of cecum   . Benign neoplasm of rectum   . Diabetes ([place]) 03/14/2016  . CKD (chronic kidney disease) stage 3, GFR 30-59 ml/min 03/14/2016  . Melena 03/14/2016  . HLD (hyperlipidemia) 03/14/2016  . Scalp lesion 03/14/2016  . Peripheral polyneuropathy ([place]) 01/26/2016  . Vitamin D deficiency 09/26/2015  . Diabetes mellitus with ophthalmic  manifestations 08/28/2015  . Chronic upper GI bleeding 06/24/2015  . COPD with asthma ([place]) 02/22/2015  . Bilateral leg edema 10/13/2014  . OSA and COPD overlap syndrome ([place]) 08/22/2014  . Chronic obstructive pulmonary disease ([place]) 05/29/2014  . Chronic hypoxic, hypercapnic respiratory failure 04/21/2014  . Microcytic anemia 04/21/2014  . Fibromyalgia 04/14/2014  . Uncontrolled secondary diabetes mellitus with stage 3 CKD (GFR 30-59) ([facility]) 04/14/2014  . Bipolar 1 disorder ([place]) 04/14/2014  . Dyspnea on exertion 04/14/2014  . Atrial fibrillation [I48.91] 04/12/2014     LOS: 2 days    Kha Hari A. 09/08/2016

## 2016-09-08 NOTE — Progress Notes (Signed)
Family Medicine Teaching Service Daily Progress Note Intern Pager: [phone]  Patient name: Denise Freeman [facility] Medical record number: [id number] Date of birth: 12-04-1950 Age: 66 y.o. Gender: female  Primary Care Provider: Darci Needle, MD Consultants: Cardiology, surgery Code Status: FULL  Pt Overview and Major Events to Date:  4/6: admission for abdominal pain and SOB 4/7: HIDA negative  Assessment and Plan: Denise Freeman is a 66 y.o. female presenting with RUQ pain and shortness of breath. 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   RUQ Abdominal Pain: Vitals stable and afebrile. Cholecystitis ruled out. HIDA negative. No kidney stones on renal CT. Less likely UTI with UA showing rare bacteria and moderate leukocytes as well as calcium oxylate crystals. Pain likely from symptomatic cholelithiasis. Other considerations include referred pain from CHF exacerbation. - Surgery recs: no abx as HIDA was negative. Could do perc drain if fails medical management but she is a poor surgical candidate. Continue to follow recommendations - Will hold off on treating with antibiotics at this time  - Continue IV diuresis  Acute on Chronic Congestive Heart Failure: ECHO showing worsening HF with EF of 30-35% and hypokinesis of the anteroseptal and apical myocardium. UO  930 cc. Does not appear very volume overloaded. Normal respiratory exam.  - Cardiology consulted: appreciate their recommendations  - Lasix 60 mg BID  - No ischemia work up  - Increase home diltiazem 30 mg to q 6  - Lexi scan  Atrial Fibrillation with mild RVR: on Coumadin INR therapeutic. CHADSVaSc score 5.  - coumadin per pharmacy  - Cardizem as above - consult Cards as above  DM2: A1c 7.5 in 07/2016. Home: Lantus 40units at bedtime, SSI, Metformin. CBGs 198 this AM  - holding Metformin - Lantus 20 units at bedtime > increase to  25 units tonight  - CBGs with meals and before bedtime, sensitive SSI    COPD with asthma: on 3L chronically. No sign of exacerbation. Was on budesonide at some point but not in current medication list. Will need to clarify.  - continue Singulair - PRN duoneb if needed.   HLD: - continue Fenofibrate   OSA: - BiPap at night - continue Modafinil   Vitamin D deficiency:  - continue Vit D   Depression:  - Remeron 15mg  bedtime   Chronic Pain:  - Continue home Percocet regimen  CKD 3: Cr 1.37 today. Baseline appears to be 1.1-1.3 - Avoid nephrotoxic agents  Hypokalemia: K of 3.2 - 40 KDUR   FEN/GI: heart healthy card modified diet Prophylaxis: Lovenox   Disposition: pending management   Subjective:  Notes that her abdominal pain and shortness of breath are unchanged.   Objective: Temp:  [98.1 F (36.7 C)-98.6 F (37 C)] 98.4 F (36.9 C) (04/08 0618) Pulse Rate:  [74-96] 80 (04/08 0618) Resp:  [18-20] 18 (04/08 0618) BP: (108-116)/(78-81) 108/81 (04/08 0618) SpO2:  [94 %-100 %] 96 % (04/08 0618) Weight:  [193 lb 3.2 oz (87.6 kg)] 193 lb 3.2 oz (87.6 kg) (04/08 7673) Physical Exam: General: Elderly female laying in bed on right side, CPAP machine on face, no acute distress Cardiovascular: Irregular rhythm, rate controlled, no murmurs, DP pulses intact bilaterally, trace edema in bilateral lower extremities Respiratory: Normal work of breathing, CPAP machine on face, diminished sounds bilaterally, no wheezes or crackles heard Abdomen: Distended, tender to palpation in right upper quadrant and a little in  right lower quadrant, normal bowel sounds  Extremities: Trace edema bilateral lower extremities, 2+ DP pulses bilaterally   Laboratory:  Recent Labs Lab 09/06/16 1527 09/07/16 0408 09/08/16 0242  WBC 11.8* 12.4* 11.4*  HGB 11.1* 10.7* 10.5*  HCT 35.2* 33.7* 33.2*  PLT 271 255 263    Recent Labs Lab 09/06/16 1527 09/07/16 0707 09/08/16 0242  NA 136  136 139  K 3.5 3.4* 3.2*  CL 87* 88* 91*  CO2 32 35* 35*  BUN 29* 32* 33*  CREATININE 1.27* 1.27* 1.37*  CALCIUM 9.0 8.9 9.0  PROT 7.1  --   --   BILITOT 0.4  --   --   ALKPHOS 65  --   --   ALT 15  --   --   AST 30  --   --   GLUCOSE 221* 193* 204*    Troponin (Point of Care Test)  Recent Labs  09/06/16 1842  TROPIPOC 0.05    Imaging/Diagnostic Tests: Nm Hepatobiliary Liver Func  Result Date: 09/07/2016 CLINICAL DATA:  Abdominal pain. EXAM: NUCLEAR MEDICINE HEPATOBILIARY IMAGING TECHNIQUE: Sequential images of the abdomen were obtained out to 60 minutes following intravenous administration of radiopharmaceutical. RADIOPHARMACEUTICALS:  5.5 mCi Tc-35m  Choletec IV COMPARISON:  None. FINDINGS: Prompt uptake and biliary excretion of activity by the liver is seen. Gallbladder activity is visualized, consistent with patency of cystic duct. Biliary activity passes into small bowel, consistent with patent common bile duct. IMPRESSION: Normal exam.  No evidence of cholecystitis or bile duct obstruction. Electronically Signed   By: Franki Cabot M.D.   On: 09/07/2016 13:19   Ct Renal Stone Study  Result Date: 09/07/2016 CLINICAL DATA:  Right upper quadrant pain. HIDA scan negative. Rare bacteria on urinalysis. EXAM: CT ABDOMEN AND PELVIS WITHOUT CONTRAST TECHNIQUE: Multidetector CT imaging of the abdomen and pelvis was performed following the standard protocol without IV contrast. COMPARISON:  Chest CT 03/12/2014, right upper quadrant ultrasound 09/06/2016 and HIDA scan 09/07/2016. FINDINGS: Lower chest: Lung bases are within normal.  Mild cardiomegaly. Hepatobiliary: Liver and biliary tree are within normal. There is mild cholelithiasis present. Subtle nonspecific hazy attenuation within the fat adjacent over the porta hepatis. Pancreas: Normal. Spleen: Normal. Adrenals/Urinary Tract: Adrenal glands are normal. Kidneys are normal in size without hydronephrosis or nephrolithiasis. Ureters and  bladder are normal. Stomach/Bowel: Stomach and small bowel are normal. Prior appendectomy. Colon is within normal. Vascular/Lymphatic: Minimal calcified plaque over the abdominal aorta and iliac arteries. Remaining vascular structures are within normal. No adenopathy. Reproductive: Within normal. Other: No free fluid.  No free peritoneal air. Musculoskeletal: Mild degenerate change of the spine and hips. IMPRESSION: Cholelithiasis. Subtle nonspecific hazy attenuation of the fat adjacent the porta hepatis which may be seen with cholecystitis or pancreatitis. Minimal aortic atherosclerosis. Electronically Signed   By: Marin Olp M.D.   On: 09/07/2016 20:21     Carlyle Dolly, MD 09/08/2016, 9:37 AM PGY-2, [place] Intern pager: [phone], text pages welcome

## 2016-09-09 ENCOUNTER — Other Ambulatory Visit: Payer: Self-pay

## 2016-09-09 DIAGNOSIS — I509 Heart failure, unspecified: Secondary | ICD-10-CM

## 2016-09-09 DIAGNOSIS — R079 Chest pain, unspecified: Secondary | ICD-10-CM

## 2016-09-09 DIAGNOSIS — L8990 Pressure ulcer of unspecified site, unspecified stage: Secondary | ICD-10-CM | POA: Insufficient documentation

## 2016-09-09 DIAGNOSIS — I420 Dilated cardiomyopathy: Secondary | ICD-10-CM

## 2016-09-09 DIAGNOSIS — I428 Other cardiomyopathies: Secondary | ICD-10-CM

## 2016-09-09 DIAGNOSIS — J441 Chronic obstructive pulmonary disease with (acute) exacerbation: Secondary | ICD-10-CM

## 2016-09-09 LAB — CBC
HEMATOCRIT: 33.4 % — AB (ref 36.0–46.0)
HEMOGLOBIN: 10.8 g/dL — AB (ref 12.0–15.0)
MCH: 28.2 pg (ref 26.0–34.0)
MCHC: 32.3 g/dL (ref 30.0–36.0)
MCV: 87.2 fL (ref 78.0–100.0)
Platelets: 287 10*3/uL (ref 150–400)
RBC: 3.83 MIL/uL — ABNORMAL LOW (ref 3.87–5.11)
RDW: 12.5 % (ref 11.5–15.5)
WBC: 12.7 10*3/uL — AB (ref 4.0–10.5)

## 2016-09-09 LAB — GLUCOSE, CAPILLARY
GLUCOSE-CAPILLARY: 263 mg/dL — AB (ref 65–99)
GLUCOSE-CAPILLARY: 275 mg/dL — AB (ref 65–99)
Glucose-Capillary: 227 mg/dL — ABNORMAL HIGH (ref 65–99)
Glucose-Capillary: 359 mg/dL — ABNORMAL HIGH (ref 65–99)

## 2016-09-09 LAB — BASIC METABOLIC PANEL
ANION GAP: 14 (ref 5–15)
BUN: 37 mg/dL — ABNORMAL HIGH (ref 6–20)
CO2: 31 mmol/L (ref 22–32)
Calcium: 8.9 mg/dL (ref 8.9–10.3)
Chloride: 89 mmol/L — ABNORMAL LOW (ref 101–111)
Creatinine, Ser: 1.58 mg/dL — ABNORMAL HIGH (ref 0.44–1.00)
GFR calc Af Amer: 39 mL/min — ABNORMAL LOW (ref >60)
GFR, EST NON AFRICAN AMERICAN: 33 mL/min — AB (ref >60)
Glucose, Bld: 210 mg/dL — ABNORMAL HIGH (ref 65–99)
POTASSIUM: 3.5 mmol/L (ref 3.5–5.1)
SODIUM: 134 mmol/L — AB (ref 135–145)

## 2016-09-09 LAB — URINE CULTURE: Culture: <10000 — AB

## 2016-09-09 LAB — PROTIME-INR
INR: 2.33
PROTHROMBIN TIME: 25.9 s — AB (ref 11.4–15.2)

## 2016-09-09 MED ORDER — IPRATROPIUM BROMIDE 0.02 % IN SOLN
0.5000 mg | Freq: Four times a day (QID) | RESPIRATORY_TRACT | Status: DC
  Administered 2016-09-09: 0.5 mg via RESPIRATORY_TRACT
  Filled 2016-09-09: qty 2.5

## 2016-09-09 MED ORDER — BUDESONIDE 0.25 MG/2ML IN SUSP: 0.2500 mg | Freq: Four times a day (QID) | RESPIRATORY_TRACT | Status: DC

## 2016-09-09 MED ORDER — LEVALBUTEROL HCL 0.63 MG/3ML IN NEBU: 0.6300 mg | INHALATION_SOLUTION | Freq: Four times a day (QID) | RESPIRATORY_TRACT | Status: DC

## 2016-09-09 MED ORDER — BUDESONIDE 0.25 MG/2ML IN SUSP
0.2500 mg | Freq: Four times a day (QID) | RESPIRATORY_TRACT | Status: DC
Start: 2016-09-09 — End: 2016-09-09

## 2016-09-09 MED ORDER — IPRATROPIUM BROMIDE 0.02 % IN SOLN: 0.5000 mg | Freq: Four times a day (QID) | RESPIRATORY_TRACT | Status: DC

## 2016-09-09 MED ORDER — WARFARIN SODIUM 2 MG PO TABS
4.0000 mg | ORAL_TABLET | Freq: Once | ORAL | Status: AC
Start: 2016-09-09 — End: 2016-09-09
  Administered 2016-09-09: 4 mg via ORAL
  Filled 2016-09-09: qty 2

## 2016-09-09 MED ORDER — LEVALBUTEROL HCL 0.63 MG/3ML IN NEBU: 0.6300 mg | INHALATION_SOLUTION | RESPIRATORY_TRACT | Status: DC | PRN

## 2016-09-09 MED ORDER — BUDESONIDE 0.25 MG/2ML IN SUSP: 0.2500 mg | Freq: Two times a day (BID) | RESPIRATORY_TRACT | Status: DC

## 2016-09-09 MED ORDER — METOPROLOL SUCCINATE ER 25 MG PO TB24
25.0000 mg | ORAL_TABLET | Freq: Two times a day (BID) | ORAL | Status: DC
  Administered 2016-09-09 – 2016-09-14 (×10): 25 mg via ORAL
  Filled 2016-09-09 (×10): qty 1

## 2016-09-09 MED ORDER — LEVALBUTEROL HCL 0.63 MG/3ML IN NEBU
0.6300 mg | INHALATION_SOLUTION | Freq: Four times a day (QID) | RESPIRATORY_TRACT | Status: DC
  Administered 2016-09-09: 0.63 mg via RESPIRATORY_TRACT
  Filled 2016-09-09: qty 3

## 2016-09-09 MED ORDER — LEVALBUTEROL HCL 0.63 MG/3ML IN NEBU
0.6300 mg | INHALATION_SOLUTION | Freq: Three times a day (TID) | RESPIRATORY_TRACT | Status: DC
  Administered 2016-09-10: 0.63 mg via RESPIRATORY_TRACT
  Filled 2016-09-09 (×3): qty 3

## 2016-09-09 MED ORDER — FUROSEMIDE 80 MG PO TABS
80.0000 mg | ORAL_TABLET | Freq: Two times a day (BID) | ORAL | Status: DC
Start: 2016-09-09 — End: 2016-09-10
  Administered 2016-09-09 – 2016-09-10 (×2): 80 mg via ORAL
  Filled 2016-09-09 (×2): qty 1

## 2016-09-09 MED ORDER — BUDESONIDE 0.5 MG/2ML IN SUSP
0.5000 mg | Freq: Two times a day (BID) | RESPIRATORY_TRACT | Status: DC
  Administered 2016-09-09 – 2016-09-14 (×8): 0.5 mg via RESPIRATORY_TRACT
  Filled 2016-09-09 (×10): qty 2

## 2016-09-09 MED ORDER — IPRATROPIUM BROMIDE 0.02 % IN SOLN
0.5000 mg | Freq: Three times a day (TID) | RESPIRATORY_TRACT | Status: DC
  Administered 2016-09-10: 0.5 mg via RESPIRATORY_TRACT
  Filled 2016-09-09 (×3): qty 2.5

## 2016-09-09 NOTE — Progress Notes (Signed)
ANTICOAGULATION CONSULT NOTE - Follow-up Consult  Pharmacy Consult for Coumadin Indication: atrial fibrillation  Labs:  Recent Labs  09/06/16 2219 09/07/16 0408 09/07/16 0707 09/08/16 0242 09/09/16 0512  HGB  --  10.7*  --  10.5* 10.8*  HCT  --  33.7*  --  33.2* 33.4*  PLT  --  255  --  263 287  LABPROT  --  28.8*  --  26.8* 25.9*  INR  --  2.65  --  2.42 2.33  CREATININE  --   --  1.27* 1.37* 1.58*  TROPONINI 0.05* 0.05* 0.04*  --   --     Estimated Creatinine Clearance: 34.5 mL/min (A) (by C-G formula based on SCr of 1.58 mg/dL (H)).   Assessment: 66 y.o. F presents with abd pain and SOB. Per MD, unlikely PE d/t PTA coumadin for afib. Cards consulted and believe SOB is r/t CHF vs lung disease. INR therapeutic on admission (2.66). INR today continues to be therapeutic at 2.33. CBC stable. No s/s bleeding.   Home dose: 4 mg daily except for 2mg  on Wed and Sat  Goal of Therapy:  INR 2-3 Monitor platelets by anticoagulation protocol: Yes   Plan:  Warfarin 4mg  PO x1 today (? Plan for surgery) Daily INR, CBC as needed Monitor for s/s bleeding   Thank you Anette Guarneri, PharmD [phone] 09/09/16 11:28 AM

## 2016-09-09 NOTE — Progress Notes (Signed)
Pt educated about safety and importance of bed alarm during the night however pt refuses to be on bed alarm. Will continue to round on patient.   Harshita Bernales, RN    

## 2016-09-09 NOTE — Progress Notes (Signed)
Inpatient Diabetes Program Recommendations  AACE/ADA: New Consensus Statement on Inpatient Glycemic Control (2015)  Target Ranges:  Prepandial:   less than 140 mg/dL      Peak postprandial:   less than 180 mg/dL (1-2 hours)      Critically ill patients:  140 - 180 mg/dL   Lab Results  Component Value Date   GLUCAP 263 (H) 09/09/2016   HGBA1C 7.5 07/22/2016    Review of Glycemic Control  Diabetes history: DM2, Obesity, steroids this admission  Outpatient Diabetes medications: Glargine 40 units daily @ HS, Humalog 24-32 units TID AC, Metformin 500 mg BID  Current orders for Inpatient glycemic control: Lantus 25 units daily @ HS, sensitive correction scale Novolog 0-9 units [facility]  Inpatient Diabetes Program Recommendations:   Please consider adding correction @ HS of Novolog 0-5 units QHS.  When patient eating, please consider meal coverage of Novolog 5 units TIDAC if patient eats > 50% of meal.   Thank you,  Windy Carina, RN, MSN Diabetes Coordinator Inpatient Diabetes Program [phone] (Team Pager)

## 2016-09-09 NOTE — Progress Notes (Signed)
Have been following patient's hospital course and stopped by bedside to check in. She reports she is feeling better since admission but is still short of breath and is worried about the prospect of having a cholecystectomy after speaking about risks of surgery with Pulmonology. She would be interested in percutaneous procedure if an option.   Thank you to the [facility] Medicine inpatient team for their excellent care.  [name], MD [place], PGY-2

## 2016-09-09 NOTE — Consult Note (Signed)
Name: Denise Freeman [facility] MRN: [id number] DOB: 1951/01/23    ADMISSION DATE:  09/06/2016 CONSULTATION DATE:  4/9  REFERRING MD :  Dr. 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 She reports good compliance with her home oxygen therapy and BiPAP. Reports that she wears BiPAP every night and when necessary during the day. 4/6 she presented to [facility] emergency department with profound dyspnea, which had been progressive over the prior 24 hours. Her BNP was elevated and she was volume overloaded on exam, therefore, she was felt to be short of breath secondary to CHF exacerbation. She was admitted to the hospitalist team and was treated with IV diuresis. She also complained of right-sided abdominal pain. CT scan demonstrated cholelithiasis with some stigmata of cholecystitis versus pancreatitis. HIDA scan negative for bile duct obstruction. She was evaluated by general surgery who feel as though she will require surgery and that a percutaneous drain is not an option. They have requested PCCM consult for preoperative optimization.  SIGNIFICANT EVENTS    STUDIES:  Echo 05/30/14 >> EF 45 to 50% PFT 10/24/14 >> FEV1 0.64 (30%), FEV1% 65, TLC 2.92 (64%), DLCO 54%, + BD Bipap 11/01/15 to 01/29/16 >> used on 89 of 90 nights with average 10 hrs 19 min.  Average AHI 2.4 with Bipap 16/10 cm H2O   US abdomen 4/6 > Cholelithiasis without other sonographic features to suggest acute cholecystitis. No biliary dilatation.Fatty infiltration of the liver.  HIDA 14/7 > Normal exam.  No evidence of cholecystitis or bile duct obstruction. 4/7 CT renal > Cholelithiasis. Subtle nonspecific hazy attenuation of the fat adjacent the porta hepatis which may be seen with cholecystitis or pancreatitis. Minimal aortic  atherosclerosis.   PAST MEDICAL HISTORY :   has a past medical history of Anemia; Anxiety; Asthma; Atrial fibrillation ([place]); Bipolar 1 disorder ([place]); Blind; CHF (congestive heart failure) ([place]); Chronic bronchitis ([place]); Colon polyps; COPD (chronic obstructive pulmonary disease) ([place]); Depression; Family history of adverse reaction to anesthesia; Fibromyalgia; GERD (gastroesophageal reflux disease); Gout; High cholesterol; History of blood transfusion (06/2015); History of hiatal hernia; HOH (hard of hearing); colonic polyps (03/21/2016); Neuropathy ([place]); On home oxygen therapy; OSA treated with BiPAP; Osteoarthritis; Pneumonia; and Type II diabetes mellitus ([place]).  has a past surgical history that includes Heel spur surgery (Bilateral); Bladder suspension; bladder stimulator; Cataract extraction w/PHACO (Right, 11/29/2014); Cataract extraction w/PHACO (Left, 12/13/2014); I&D extremity (Right, 06/13/2015); Tubal ligation; Cervical disc surgery (N/A, 2009); Umbilical hernia repair; Appendectomy; Hernia repair; Esophagogastroduodenoscopy (egd) with propofol (N/A, 03/15/2016); and Colonoscopy with propofol (N/A, 03/15/2016). Prior to Admission medications   Medication Sig Start Date End Date Taking? Authorizing Provider  albuterol (PROVENTIL) (2.5 MG/3ML) 0.083% nebulizer solution Take 2.5 mg by nebulization every 6 (six) hours as needed for wheezing or shortness of breath.   Yes Historical Provider, MD  atorvastatin (LIPITOR) 40 MG tablet Take 1 tablet (40 mg total) by mouth daily. 08/03/16  Yes Hillary Corinda Gubler, MD  cetirizine (ZYRTEC) 10 MG tablet Take 10 mg by mouth at bedtime.   Yes Historical Provider, MD  Cholecalciferol (VITAMIN D3) 400 units CAPS Take 800 Units by mouth daily. Patient taking differently: Take 400 Units by mouth 2 (two) times daily.  08/03/16  Yes Hillary Corinda Gubler, MD  diltiazem (CARDIZEM) 30 MG tablet Take 1 tablet (30  mg total) by mouth 2 (two) times daily. 08/07/16  Yes  Pixie Casino, MD  docusate sodium (COLACE) 100 MG capsule Take 100 mg by mouth daily.   Yes Historical Provider, MD  escitalopram (LEXAPRO) 10 MG tablet Take 10 mg by mouth at bedtime.    Yes Historical Provider, MD  fenofibrate 54 MG tablet Take 1 tablet (54 mg total) by mouth daily. 06/04/16  Yes Philemon Kingdom, MD  ferrous sulfate 324 (65 Fe) MG TBEC Take 1 tablet by mouth daily.   Yes Historical Provider, MD  fluticasone (FLONASE) 50 MCG/ACT nasal spray Place 1 spray into both nostrils daily as needed for allergies or rhinitis. 06/09/16  Yes Hillary Corinda Gubler, MD  furosemide (LASIX) 40 MG tablet Take 2 tablets (80 mg total) by mouth 2 (two) times daily. 08/07/16  Yes Pixie Casino, MD  Insulin Glargine (BASAGLAR KWIKPEN) 100 UNIT/ML SOPN Inject 40 units into the skin 2 times daily Patient taking differently: Inject 40 Units into the skin daily at 10 pm.  08/01/16  Yes Philemon Kingdom, MD  insulin lispro (HUMALOG KWIKPEN) 100 UNIT/ML KiwkPen Inject 24-32 units under skin 3x a day 07/22/16  Yes Philemon Kingdom, MD  ipratropium (ATROVENT) 0.02 % nebulizer solution Take 0.5 mg by nebulization every 6 (six) hours as needed for wheezing or shortness of breath.   Yes Historical Provider, MD  metFORMIN (GLUCOPHAGE-XR) 500 MG 24 hr tablet Take 1 tablet (500 mg total) by mouth 2 (two) times daily. 08/16/16  Yes Philemon Kingdom, MD  metolazone (ZAROXOLYN) 2.5 MG tablet Take 1 tablet (2.5 mg total) by mouth daily. 08/07/16  Yes Pixie Casino, MD  mirtazapine (REMERON) 15 MG tablet Take 15 mg by mouth at bedtime.   Yes Historical Provider, MD  modafinil (PROVIGIL) 200 MG tablet Take 1 tablet (200 mg total) by mouth daily. 08/29/16  Yes Chesley Mires, MD  montelukast (SINGULAIR) 10 MG tablet Take 1 tablet (10 mg total) by mouth at bedtime. 06/10/16  Yes Chesley Mires, MD  nitroGLYCERIN (NITROSTAT) 0.4 MG SL tablet Place 1 tablet (0.4 mg total) under the tongue every 5 (five) minutes as needed for chest pain.  10/31/15  Yes Pixie Casino, MD  nystatin (NYSTATIN) powder Apply topically 2 (two) times daily. Apply under breasts 06/09/16  Yes Hillary Corinda Gubler, MD  ondansetron (ZOFRAN-ODT) 4 MG disintegrating tablet Take 1 tablet (4 mg total) by mouth 2 (two) times daily as needed for nausea or vomiting. 06/09/16  Yes Hillary Corinda Gubler, MD  oxyCODONE-acetaminophen (PERCOCET/ROXICET) 5-325 MG tablet Take 1 tablet by mouth every 8 (eight) hours as needed for severe pain.   Yes Historical Provider, MD  OXYGEN Inhale 3 L into the lungs continuous. 3 liters 24/7   Yes Historical Provider, MD  pantoprazole (PROTONIX) 20 MG tablet Take 1 tablet (20 mg total) by mouth 2 (two) times daily before a meal. 08/03/16  Yes Rogue Bussing, MD  potassium chloride SA (K-DUR,KLOR-CON) 20 MEQ tablet Take 1 tablet (20 mEq total) by mouth 3 (three) times daily. 08/03/16  Yes Hillary Corinda Gubler, MD  PRESCRIPTION MEDICATION Inhale into the lungs See admin instructions. Use BIPAP every time laying down   Yes Historical Provider, MD  primidone (MYSOLINE) 50 MG tablet Take 1 tablet (50 mg total) by mouth at bedtime. 08/03/16  Yes Hillary Corinda Gubler, MD  Probiotic Product (PROBIOTIC PO) Take 1 tablet by mouth at bedtime.   Yes Historical Provider, MD  triamcinolone lotion (KENALOG) 0.1 % Apply  1 application topically 3 (three) times daily. For up to 14 days and as needed 01/26/16  Yes Betty G [place]ique, MD  VITAMIN A PO Take 1 tablet by mouth daily.   Yes Historical Provider, MD  VITAMIN E PO Take 1 tablet by mouth daily.   Yes Historical Provider, MD  warfarin (COUMADIN) 4 MG tablet Take 1 tablet daily except 1/2 tablet on Wednesdays and Saturdays Patient taking differently: Take 2-4 mg by mouth daily at 6 PM. Take 1 tablet daily except 1/2 tablet on Wednesdays and Saturdays 09/04/16  Yes Herminio Commons, MD   Allergies  Allergen Reactions  . Xarelto [Rivaroxaban] Other (See Comments)    Internal bleeding  .  Ancef [Cefazolin] Nausea And Vomiting  . Levaquin [Levofloxacin In D5w] Other (See Comments)    "afib"  . Lyrica [Pregabalin] Hives  . Tamiflu [Oseltamivir Phosphate] Other (See Comments)    "water blisters"  . Zoloft [Sertraline Hcl] Other (See Comments)    Jaw problems, jittery  . Augmentin [Amoxicillin-Pot Clavulanate] Itching  . Ciprofloxacin Itching  . Haldol [Haloperidol] Other (See Comments)    Restless leg  . Nsaids Diarrhea  . Penicillins Itching, Nausea And Vomiting and Rash    Has patient had a PCN reaction causing immediate rash, facial/tongue/throat swelling, SOB or lightheadedness with hypotension: Yes Has patient had a PCN reaction causing severe rash involving mucus membranes or skin necrosis: No Has patient had a PCN reaction that required hospitalization No Has patient had a PCN reaction occurring within the last 10 years: Yes If all of the above answers are "NO", then may proceed with Cephalosporin use.  . Topamax [Topiramate] Nausea Only    FAMILY HISTORY:  family history includes Breast cancer in her mother; COPD in her mother and sister; Diabetes in her mother and sister; Heart disease in her father, maternal grandmother, mother, and sister; Hyperlipidemia in her father. SOCIAL HISTORY:  reports that she quit smoking about 27 years ago. Her smoking use included Cigarettes. She has a 28.00 pack-year smoking history. She has never used smokeless tobacco. She reports that she does not drink alcohol or use drugs.  REVIEW OF SYSTEMS:   Bolds are positive  Constitutional: weight loss, gain, night sweats, Fevers, chills, fatigue .  HEENT: headaches, Sore throat, sneezing, nasal congestion, post nasal drip, Difficulty swallowing, Tooth/dental problems, visual complaints visual changes, ear ache CV:  chest pain, radiates:,Orthopnea, PND, swelling in lower extremities, dizziness, palpitations, syncope.  GI  heartburn, indigestion, abdominal pain, nausea, vomiting,  diarrhea, change in bowel habits, loss of appetite, bloody stools.  Resp: cough, productive: , hemoptysis, dyspnea, chest pain, pleuritic.  Skin: rash or itching or icterus GU: dysuria, change in color of urine, urgency or frequency. flank pain, hematuria  MS: joint pain or swelling. decreased range of motion  Psych: change in mood or affect. depression or anxiety.  Neuro: difficulty with speech, weakness, numbness, ataxia    SUBJECTIVE:   VITAL SIGNS: Temp:  [98 F (36.7 C)-98.4 F (36.9 C)] 98.3 F (36.8 C) (04/09 1205) Pulse Rate:  [55-103] 93 (04/09 1205) Resp:  [18] 18 (04/09 1205) BP: (108-143)/(57-73) 117/73 (04/09 1205) SpO2:  [95 %-100 %] 95 % (04/09 1205) Weight:  [89 kg (196 lb 3.2 oz)] 89 kg (196 lb 3.2 oz) (04/09 0502)  PHYSICAL EXAMINATION: General:  Morbidly obese female  Neuro:  Awake, alert, oriented. Non-focal HEENT:  [place]/AT, PERRL Cardiovascular:  IRIR, borderline tachy. Lungs:  Distant but clear bilateral breath sounds. No wheeze Abdomen:  Soft, non-tender, non-distended Musculoskeletal:  No acute deformity or ROM limitation. BLE +1 edema Skin:  Grossly intact   Recent Labs Lab 09/07/16 0707 09/08/16 0242 09/09/16 0512  NA 136 139 134*  K 3.4* 3.2* 3.5  CL 88* 91* 89*  CO2 35* 35* 31  BUN 32* 33* 37*  CREATININE 1.27* 1.37* 1.58*  GLUCOSE 193* 204* 210*    Recent Labs Lab 09/07/16 0408 09/08/16 0242 09/09/16 0512  HGB 10.7* 10.5* 10.8*  HCT 33.7* 33.2* 33.4*  WBC 12.4* 11.4* 12.7*  PLT 255 263 287   Ct Renal Stone Study  Result Date: 09/07/2016 CLINICAL DATA:  Right upper quadrant pain. HIDA scan negative. Rare bacteria on urinalysis. EXAM: CT ABDOMEN AND PELVIS WITHOUT CONTRAST TECHNIQUE: Multidetector CT imaging of the abdomen and pelvis was performed following the standard protocol without IV contrast. COMPARISON:  Chest CT 03/12/2014, right upper quadrant ultrasound 09/06/2016 and HIDA scan 09/07/2016. FINDINGS: Lower chest: Lung bases  are within normal.  Mild cardiomegaly. Hepatobiliary: Liver and biliary tree are within normal. There is mild cholelithiasis present. Subtle nonspecific hazy attenuation within the fat adjacent over the porta hepatis. Pancreas: Normal. Spleen: Normal. Adrenals/Urinary Tract: Adrenal glands are normal. Kidneys are normal in size without hydronephrosis or nephrolithiasis. Ureters and bladder are normal. Stomach/Bowel: Stomach and small bowel are normal. Prior appendectomy. Colon is within normal. Vascular/Lymphatic: Minimal calcified plaque over the abdominal aorta and iliac arteries. Remaining vascular structures are within normal. No adenopathy. Reproductive: Within normal. Other: No free fluid.  No free peritoneal air. Musculoskeletal: Mild degenerate change of the spine and hips. IMPRESSION: Cholelithiasis. Subtle nonspecific hazy attenuation of the fat adjacent the porta hepatis which may be seen with cholecystitis or pancreatitis. Minimal aortic atherosclerosis. Electronically Signed   By: Marin Olp M.D.   On: 09/07/2016 20:21    ASSESSMENT / PLAN:  Acute on chronic dCHF, with new onset systolic CHF with LVEF 75-17% : - Cardiology following and directing diuresis.   COPD +/- acute exacerbation: currently no obvious exacerbation, on admission described as unlikely as well.  - Start nebulized budesonide, ipratropium, and xopenex scheduled - PRN xopenex - Discontinue prednisone  OSA on BiPAP with chronic hypoxemic respiratory failure. - Continue home BiPAP 60/10 cmH20 qhs - Supplemental O2 at 3L or as needed to keep SpO2 88-95%  Atrial fibrillation: intermittently with rapid ventricular response, anticoagulated with warfarin. - Per cardiology/primary  Cholelithiasis, symptomatic: Surgery following, likely will require lap chole. - She has multiple co-morbidities placing her at high risk for perioperative complications including, but not limited to, inability to wean from the ventilator  machine. She is aware of this and would like to discuss her options further if she is to have the surgery.  Georgann Housekeeper, AGACNP-BC [place] [facility] Pager [phone] or [phone]  09/09/2016 3:31 PM    ATTENDING NOTE / ATTESTATION NOTE :   I have discussed the case with the resident/APP  Georgann Housekeeper MP.   I agree with the resident/APP's  history, physical examination, assessment, and plans.    I have edited the above note and modified it according to our agreed history, physical examination, assessment and plan.   Briefly, patient known to have severe OSA, compliant on Bipap 16/10 with 3L O2, severe COPD, on 3 L oxygen 24/7, also known to have CHF EF 30-35% and atrial fibrillation and DM and CKD,  admitted with acute dyspnea related to acute sCHF exacerbation + mild AECOPD. She has also been complaining of  subacute  right upper abdominal pain, worsened by fatty food intake. CT scan of the abdomen showed cholelithiasis. HIDA scan was (-) for cholecystitis. Cardiology and Surgery are following pt.  PCCM was asked to see pt pre-operatively.  Patient feels better since she came in. Her dyspnea is better. Denies chest pain. Has dry cough. Her right upper quadrant pain is usually worsened by certain food intake.  Vitals:  Vitals:   09/09/16 0502 09/09/16 0821 09/09/16 0837 09/09/16 1205  BP: (!) 143/57  (!) 131/59 117/73  Pulse: (!) 55  (!) 103 93  Resp: 18   18  Temp: 98.4 F (36.9 C)   98.3 F (36.8 C)  TempSrc: Oral   Oral  SpO2: 100% 97%  95%  Weight: 89 kg (196 lb 3.2 oz)     Height:        Constitutional/General: morbidly obese, NAD  Body mass index is 39.63 kg/m. Wt Readings from Last 3 Encounters:  09/09/16 89 kg (196 lb 3.2 oz)  09/06/16 90.2 kg (198 lb 12.8 oz)  08/23/16 89.8 kg (198 lb)    HEENT: PERLA, anicteric sclerae. (-) Oral thrush.   Neck: No masses. Midline trachea. No JVD, (-) LAD. (-) bruits appreciated.  Respiratory/Chest:  Grossly normal chest. (-) deformity. (-) Accessory muscle use.  Symmetric expansion. Diminished BS on both lower lung zones. (-) wheezing, rhonchi Occasional crackles at bases. (-) egophony  Cardiovascular: Regular rate and  rhythm, heart sounds normal, no murmur or gallops,  Trace peripheral edema  Gastrointestinal:  Normal bowel sounds. Soft, non-tender. No hepatosplenomegaly.  (-) masses.   Musculoskeletal:  Normal muscle tone.   Extremities: Grossly normal. (-) clubbing, cyanosis.  (-) edema  Skin: (-) rash,lesions seen.   Neurological/Psychiatric :CN grossly intact. (-) lateralizing signs.     CBC Recent Labs     09/07/16  0408  09/08/16  0242  09/09/16  0512  WBC  12.4*  11.4*  12.7*  HGB  10.7*  10.5*  10.8*  HCT  33.7*  33.2*  33.4*  PLT  255  263  287    Coag's Recent Labs     09/07/16  0408  09/08/16  0242  09/09/16  0512  INR  2.65  2.42  2.33    BMET Recent Labs     09/07/16  0707  09/08/16  0242  09/09/16  0512  NA  136  139  134*  K  3.4*  3.2*  3.5  CL  88*  91*  89*  CO2  35*  35*  31  BUN  32*  33*  37*  CREATININE  1.27*  1.37*  1.58*  GLUCOSE  193*  204*  210*    Electrolytes Recent Labs     09/07/16  0408  09/07/16  0707  09/08/16  0242  09/09/16  0512  CALCIUM   --   8.9  9.0  8.9  MG  1.8   --    --    --     Sepsis Markers No results for input(s): PROCALCITON, O2SATVEN in the last 72 hours.  Invalid input(s): LACTICACIDVEN  ABG No results for input(s): PHART, PCO2ART, PO2ART in the last 72 hours.  Liver Enzymes No results for input(s): AST, ALT, ALKPHOS, BILITOT, ALBUMIN in the last 72 hours.  Cardiac Enzymes Recent Labs     09/06/16  2219  09/07/16  0408  09/07/16  0707  TROPONINI  0.05*  0.05*  0.04*    Glucose Recent  Labs     09/08/16  1123  09/08/16  1633  09/08/16  2051  09/09/16  0751  09/09/16  1208  09/09/16  1616  GLUCAP  219*  187*  215*  227*  263*  359*    Imaging Ct Renal Stone  Study  Result Date: 09/07/2016 CLINICAL DATA:  Right upper quadrant pain. HIDA scan negative. Rare bacteria on urinalysis. EXAM: CT ABDOMEN AND PELVIS WITHOUT CONTRAST TECHNIQUE: Multidetector CT imaging of the abdomen and pelvis was performed following the standard protocol without IV contrast. COMPARISON:  Chest CT 03/12/2014, right upper quadrant ultrasound 09/06/2016 and HIDA scan 09/07/2016. FINDINGS: Lower chest: Lung bases are within normal.  Mild cardiomegaly. Hepatobiliary: Liver and biliary tree are within normal. There is mild cholelithiasis present. Subtle nonspecific hazy attenuation within the fat adjacent over the porta hepatis. Pancreas: Normal. Spleen: Normal. Adrenals/Urinary Tract: Adrenal glands are normal. Kidneys are normal in size without hydronephrosis or nephrolithiasis. Ureters and bladder are normal. Stomach/Bowel: Stomach and small bowel are normal. Prior appendectomy. Colon is within normal. Vascular/Lymphatic: Minimal calcified plaque over the abdominal aorta and iliac arteries. Remaining vascular structures are within normal. No adenopathy. Reproductive: Within normal. Other: No free fluid.  No free peritoneal air. Musculoskeletal: Mild degenerate change of the spine and hips. IMPRESSION: Cholelithiasis. Subtle nonspecific hazy attenuation of the fat adjacent the porta hepatis which may be seen with cholecystitis or pancreatitis. Minimal aortic atherosclerosis. Electronically Signed   By: Marin Olp M.D.   On: 09/07/2016 20:21   Assessment/Plan : Acute SOB 2/2 acute sCHF exacerbation + mild AECOPD. Severe COPD/asthma - She is improved. I think she is at baseline now - She has diuresced well.  Will defer diuretics per cardiology / primary srvice - will optimize her with Pulmicort neb 0.5 mg BID + atrovent neb q6 + xopenex neb q6.   - Will hold off on IV steroids for now.  No wheezing appreciated - cont o2 3L which is her baseline  Severe OSA - She is compliant and is  treated with her Bipap machine set at 16/10 with 3LO2  Pre-Surgery Evaluation  - I extensively discussed with the patient her increased pulmonary risks for complication given her severe sleep apnea, severe COPD, chronic hypoxemic respiratory failure, obesity hypoventilation syndrome. She also has other risks from her atrial fibrillation, CHF, diabetes, CKD. She understands her risks and she is willing to take these risks if she has to undergo surgery. I recommended, if surgery is necessary, she will most likely be on the ventilator postoperatively. We will manage her on the ventilator postoperatively. If she will end up having a percutaneous procedure, with moderate sedation, she may just need BiPAP post procedure. Her sleep apnea is corrected on Bipap 16/10, with 3 L oxygen. If she ends up being on the ventilator, I hope we can quickly wean her and avoid a tracheostomy.   -  Please let us know once pt is to have a procedure.  We will follow her post operatively.  Most likely, she will be in ICU.    Family :  No family at bedside.    Monica Becton, MD 09/09/2016, 4:34 PM [place] [facility] Pager [phone] After 3 pm or if no answer, call [phone]

## 2016-09-09 NOTE — Progress Notes (Signed)
Subjective: Some ruq pain, better, sob  Objective: Vital signs in last 24 hours: Temp:  [97.9 F (36.6 C)-98.4 F (36.9 C)] 98.4 F (36.9 C) (04/09 0502) Pulse Rate:  [55-107] 103 (04/09 0837) Resp:  [18-20] 18 (04/09 0502) BP: (108-143)/(57-73) 131/59 (04/09 0837) SpO2:  [96 %-100 %] 97 % (04/09 0821) Weight:  [89 kg (196 lb 3.2 oz)] 89 kg (196 lb 3.2 oz) (04/09 0502) Last BM Date: 09/08/16  Intake/Output from previous day: 04/08 0701 - 04/09 0700 In: 720 [P.O.:720] Out: 1951 [Urine:1950; Stool:1] Intake/Output this shift: Total I/O In: -  Out: 200 [Urine:200]  GI: soft no ruq tenderness on palpation  Lab Results:   Recent Labs  09/08/16 0242 09/09/16 0512  WBC 11.4* 12.7*  HGB 10.5* 10.8*  HCT 33.2* 33.4*  PLT 263 287   BMET  Recent Labs  09/08/16 0242 09/09/16 0512  NA 139 134*  K 3.2* 3.5  CL 91* 89*  CO2 35* 31  GLUCOSE 204* 210*  BUN 33* 37*  CREATININE 1.37* 1.58*  CALCIUM 9.0 8.9   PT/INR  Recent Labs  09/08/16 0242 09/09/16 0512  LABPROT 26.8* 25.9*  INR 2.42 2.33   ABG No results for input(s): PHART, HCO3 in the last 72 hours.  Invalid input(s): PCO2, PO2  Studies/Results: Nm Hepatobiliary Liver Func  Result Date: 09/07/2016 CLINICAL DATA:  Abdominal pain. EXAM: NUCLEAR MEDICINE HEPATOBILIARY IMAGING TECHNIQUE: Sequential images of the abdomen were obtained out to 60 minutes following intravenous administration of radiopharmaceutical. RADIOPHARMACEUTICALS:  5.5 mCi Tc-14m  Choletec IV COMPARISON:  None. FINDINGS: Prompt uptake and biliary excretion of activity by the liver is seen. Gallbladder activity is visualized, consistent with patency of cystic duct. Biliary activity passes into small bowel, consistent with patent common bile duct. IMPRESSION: Normal exam.  No evidence of cholecystitis or bile duct obstruction. Electronically Signed   By: Franki Cabot M.D.   On: 09/07/2016 13:19   Ct Renal Stone Study  Result Date:  09/07/2016 CLINICAL DATA:  Right upper quadrant pain. HIDA scan negative. Rare bacteria on urinalysis. EXAM: CT ABDOMEN AND PELVIS WITHOUT CONTRAST TECHNIQUE: Multidetector CT imaging of the abdomen and pelvis was performed following the standard protocol without IV contrast. COMPARISON:  Chest CT 03/12/2014, right upper quadrant ultrasound 09/06/2016 and HIDA scan 09/07/2016. FINDINGS: Lower chest: Lung bases are within normal.  Mild cardiomegaly. Hepatobiliary: Liver and biliary tree are within normal. There is mild cholelithiasis present. Subtle nonspecific hazy attenuation within the fat adjacent over the porta hepatis. Pancreas: Normal. Spleen: Normal. Adrenals/Urinary Tract: Adrenal glands are normal. Kidneys are normal in size without hydronephrosis or nephrolithiasis. Ureters and bladder are normal. Stomach/Bowel: Stomach and small bowel are normal. Prior appendectomy. Colon is within normal. Vascular/Lymphatic: Minimal calcified plaque over the abdominal aorta and iliac arteries. Remaining vascular structures are within normal. No adenopathy. Reproductive: Within normal. Other: No free fluid.  No free peritoneal air. Musculoskeletal: Mild degenerate change of the spine and hips. IMPRESSION: Cholelithiasis. Subtle nonspecific hazy attenuation of the fat adjacent the porta hepatis which may be seen with cholecystitis or pancreatitis. Minimal aortic atherosclerosis. Electronically Signed   By: Marin Olp M.D.   On: 09/07/2016 20:21    Anti-infectives: Anti-infectives    None      Assessment/Plan: Likely symptomatic cholelithiasis  - I think symptoms are likely gb related.  Appears there is no cv contraindication for surgery and would just need inr managed -I think allowing to eat today to see how she does reasonable. Would  have pulmonary see her to evaluate her for possible lap chole though -perc drain not option for symptomatic cholelithiasis without  cholecystitis [facility] 09/09/2016

## 2016-09-09 NOTE — Progress Notes (Signed)
Family Medicine Teaching Service Daily Progress Note Intern Pager: [phone]  Patient name: Denise Freeman Medical record number: [id number] Date of birth: January 12, 1951 Age: 66 y.o. Gender: female  Primary Care Provider: Darci Needle, MD Consultants: Cardiology, General surgery Code Status: FULL  Pt Overview and Major Events to Date:  04/06: Admit for abdominal pain and SOB 04/07: HIDA negative  Assessment and Plan: Denise Freeman is a 66 y.o. female presenting with RUQ pain and shortness of breath. 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   #RUQ Abdominal Pain: Vitals stable and afebrile. Cholecystitis ruled out. HIDA negative. No kidney stones on renal CT. Less likely UTI with UA showing rare bacteria and moderate leukocytes as well as calcium oxylate crystals. Pain likely from symptomatic cholelithiasis. Other considerations include referred pain from CHF exacerbation. --Surgery recs: consider possible laparoscopic cholecystectomy --Will hold off on treating with antibiotics at this time  --Continue IV diuresis  #Acute on Chronic Congestive HFrEF: ECHO showing worsening HF with EF of 30-35% and hypokinesis of the anteroseptal and apical myocardium. UO  930 cc. Does not appear very volume overloaded. Normal respiratory exam.  --Cardiology consulted, appreciate recommendations: Ischemic workup, consider Lexiscan --Lasix 60 mg BID, consider transition to home regimen PO Lasix 80 mg BID --Increase home diltiazem 30 mg to q6h  #Atrial Fibrillation with mild RVR: Chronic. Rate controlled. On Coumadin INR therapeutic. CHADSVaSc score 5.  --Coumadin per pharmacy  --Cardizem as above  #Insulin-dependent diabetes mellitus: Chronic. Stable. A1c 7.5 in 07/2016. Home: Lantus 40units at bedtime, SSI, Metformin. CBGs 198 this AM  --holding Metformin --Lantus 20 units at bedtime > increase to 25  units tonight  --CBGs with meals and before bedtime, sensitive SSI    #COPD  Asthma:Possible acute on chronic exacerbation. Stable. On 3L [place] at home. Was on budesonide at some point but not in current medication list.  --Singulair with PRN duoneb --Prednisone 50 mg (day 1 of 5, 4/9-13)  #Hyperlipidemia: Chornic. On fenofibrate. --Fenofibrate   #Obstructive sleep apnea: Chronic. On BiPAP. --BiPAP QHS --Continue Modafinil   #Vitamin D deficiency: Chronic. On supplementation. --Continue Vit D   #Depression: No active depression since admission. --Remeron 15 mg QHS  #Chronic Pain: Continue home Percocet regimen  #Chronic kidney disease stage III: Stable. Baseline appears to be 1.1-1.3 --Avoid nephrotoxic agents  #Hypokalemia: Acute. Resolved.  FEN/GI: Heart healthy card modified diet Prophylaxis: Lovenox SQ  Disposition: Pending Lexiscan per cardiology recommendations. May need lap chole.  Subjective:  Patient states right upper quadrant pain is unchanged from yesterday. Denies shortness of breath or other pain.  Objective: Temp:  [97.9 F (36.6 C)-98.4 F (36.9 C)] 98.4 F (36.9 C) (04/09 0502) Pulse Rate:  [55-107] 103 (04/09 0837) Resp:  [18-20] 18 (04/09 0502) BP: (108-143)/(57-73) 131/59 (04/09 0837) SpO2:  [96 %-100 %] 97 % (04/09 0821) Weight:  [196 lb 3.2 oz (89 kg)] 196 lb 3.2 oz (89 kg) (04/09 0502) Physical Exam: General: elderly female laying in bed on left side, CPAP machine on face, no acute distress Cardiovascular: irregular rhythm rate controlled, no murmurs, DP pulses intact bilaterally, trace edema in b/l LE Respiratory: More complete breathing, CPAP in place, no wheezes, rhonchi, or crackles, talking in complete sentences Abdomen: tender to palpation in RUQ, normoactive bowel sounds present Extremities: trace edema bilateral lower extremities, 2+ DP pulses b/l  Laboratory:  Recent Labs Lab 09/07/16 0408 09/08/16 0242 09/09/16 2585  WBC  12.4* 11.4* 12.7*  HGB 10.7* 10.5* 10.8*  HCT 33.7* 33.2* 33.4*  PLT 255 263 287    Recent Labs Lab 09/06/16 1527 09/07/16 0707 09/08/16 0242 09/09/16 0512  NA 136 136 139 134*  K 3.5 3.4* 3.2* 3.5  CL 87* 88* 91* 89*  CO2 32 35* 35* 31  BUN 29* 32* 33* 37*  CREATININE 1.27* 1.27* 1.37* 1.58*  CALCIUM 9.0 8.9 9.0 8.9  PROT 7.1  --   --   --   BILITOT 0.4  --   --   --   ALKPHOS 65  --   --   --   ALT 15  --   --   --   AST 30  --   --   --   GLUCOSE 221* 193* 204* 210*    Troponin (Point of Care Test)  Recent Labs  09/06/16 1842  TROPIPOC 0.05    Imaging/Diagnostic Tests: No results found.   [place] Bing, DO 09/09/2016, 9:10 AM PGY-1, [place] Intern pager: [phone], text pages welcome

## 2016-09-09 NOTE — Consult Note (Signed)
[facility] CM Inpatient Consult   09/09/2016  Denise Freeman [facility] Sep 28, 1950 [id number]   Patient assessed for COPD exacerbation with HX of HF as a Medicare [place] beneficiary.Chart review reveals the patient is a 66 year old female with past medical history of severe home O2 dependent COPD, permanent atrial fibrillation, chronic diastolic congestive heart failure, bipolar disorder, fibromyalgia with acute on chronic diastolic CHF. Cath 10/14 with normal coronaries. Echo this admission shows EF 30-35 (worse compared to previous) with anteroseptal and apical hypokinesis; RAE and RVE per MD notes. Met with the patient at the bedside. Patient states she is active with [name] as her primary care provider.  Patient denies transportation problems but does express having issues with affording and getting her medications timely since she has [employer]. Pulmonary NP arrived to see patient.  Will follow up further for community follow up needs.  For questions, please contact:  Natividad Brood, RN BSN [facility] Liaison  [phone] business mobile phone Toll free office [phone]

## 2016-09-09 NOTE — Discharge Summary (Signed)
[facility] Discharge Summary  Patient name: Denise Freeman [facility] Medical record number: [id number] Date of birth: 1951/02/27 Age: 66 y.o. Gender: female Date of Admission: 09/06/2016  Date of Discharge: 09/14/2016 Admitting Physician: Lupita Dawn, MD  Primary Care Provider: Darci Needle, MD Consultants: General surgery  Indication for Hospitalization: RUQ pain with SOB  Discharge Diagnoses/Problem List:  RUQ abdominal pain HFrEF Atrial fibrillation Insulin-dependent diabetes mellitus Chronic kidney disease stage III COPD Asthma Hyperlipidemia Obstructive sleep apnea Vitamin D deficiency Depression Chronic pain  Disposition: Home  Discharge Condition: Stable, improved  Discharge Exam:  General: obese, alert, in no acute distress with non-toxic appearance HEENT: normocephalic, atraumatic, edentulous CV: Irregularly irregular, no rubs, or gallops Lungs: clear to auscultation bilaterally with normal work of breathing Abdomen: normoactive bowel sounds, some guarding with palpation but does feel soft Skin: warm, dry, no erythema around laparoscopic sites, cap refill < 2 seconds Extremities: warm and well perfused, normal tone, trace edema bilateral lower extremities  Brief Hospital Course:  Denise Freeman a 67 y.o.femalepresenting with RUQ pain and shortness of breath. PMH is significant for DM2 (A1c 7.5 in 07/2016), CKD3, Afib on coumadin ( CHADSVaSc score 5), HFpEF (worsening HF with EF of 30-35% and hypokinesis of the anteroseptal and apical myocardium),COPD with asthmaon 3L O2, Bipolar, OSA, Vit D deficiency, HLD.  Patient reported right upper quadrant pain with dyspnea on exertion for 3 days prompting visit to [facility] ED. Patient denied history of nausea or vomiting, chest pain. She endorsed taking her Lasix 80 mg twice a day metolazone as prescribed. On arrival, VS stable with mild tachycardia, afebrile. Mild leukocytosis 11.8  with stable hemoglobin. Lipase and CMET unremarkable. EKG consistent with chronic A. fib rate controlled. CXR mild vascular congestion.  Several etiologies were considered including however LFTs not elevated. CXR without focal infiltrate. EKG without ST changes or T-wave abnormalities. CT renal stone study without signs of nephrolithiasis. US abdominal RUQ showed cholelithiasis with features suggesting acute cholecystitis without biliary dilation. HIDA scan normal without evidence of cholecystitis or bile duct instruction. General surgery consulted recommending laparoscopic cholecystectomy as they did not see this is elective and patient was at risk of complications from confirmed gallstones. NM study performed and evaluated by cardiology who deemed patient high risk with EF 26% global hypokinesis and septal dyskinesia. Pulmonology evaluated patient suggesting possibility of need for BiPAP postprocedure, or worse prolonged intubation on ventilator given COPD, OSA, and obesity hypoventilation syndrome. These risks were all discussed with patient at length, however patient wanted to proceed with procedure. Vitamin K given to decrease INR to <1.5 and a laparoscopic cholecystectomy was performed on 09/13/16. Operation was uncomplicated and findings consistent with cholecystitis. Patient was successfully extubated after completion of surgery and remained stable on home BiPAP. By the next day, patient was tolerating a diet, and was safe for d/c with f/u.  Issues for Follow Up:  1. Will need BMET checked, has catheter was discontinued on discharge ptient. 2. We'll need to adjust insulin regimen given lower requirements during hospitalization due to decrease intake will NPO. 3. Check fluid status as lasix dose decreased by half and metolazone discontinued upon discharge. 4. Continue Toprol-XL 25 mg BID for rate control.   Significant Procedures: Lap chole (4/13)  Significant Labs and Imaging:   Recent Labs Lab  09/12/16 0306 09/13/16 1911 09/14/16 0450  WBC 12.8* 13.1* 12.1*  HGB 10.6* 10.1* 10.0*  HCT 33.6* 32.9* 32.3*  PLT 336 308 303  Recent Labs Lab 09/10/16 1147 09/11/16 0319 09/12/16 0306 09/13/16 1911 09/14/16 0450  NA 133* 134* 137 136 138  K 3.8 3.6 3.7 4.4 4.1  CL 89* 93* 97* 99* 98*  CO2 30 29 27 26 28   GLUCOSE 286* 168* 111* 147* 203*  BUN 53* 58* 54* 40* 39*  CREATININE 2.00* 2.07* 1.61* 1.35* 1.47*  CALCIUM 8.4* 8.3* 8.2* 8.2* 8.3*  ALKPHOS  --   --   --  81 74  AST  --   --   --  316* 254*  ALT  --   --   --  223* 205*  ALBUMIN  --   --   --  3.3* 3.1*    Lipase: 13 (WNL)  BNP: 680 > 644 (baseline 200s) Troponin: 0.03 > 0.05 > 0.05 > 0.04 INR: 2.65 > 2.42 > 2.33 > 2.79 > 1.83 Magnesium: 1.8 (WNL) UCx: <10,000 colonies, insignificant growth  Imaging/Diagnostic Tests: NM Myocar Multi W/Spect W/Wall Motion / EF (09/10/2016) There was no ST segment deviation noted during stress. No T wave inversion was noted during stress. Defect 1: There is a medium defect of moderate severity. This is a high risk study. Nuclear stress EF: 26%.  Medium size, moderate intensity fixed septal bundle branch/PVC-related artifact. No reversible ischemia. LVEF 26% with global hypokinesis and septal dyskinesis. This is a high risk study, given severely depressed LVEF.  CT RENAL STONE STUDY (09/07/2016) FINDINGS: Lower chest: Lung bases are within normal. Mild cardiomegaly. Hepatobiliary: Liver and biliary tree are within normal. There is mild cholelithiasis present. Subtle nonspecific hazy attenuation within the fat adjacent over the porta hepatis. Pancreas: Normal. Spleen: Normal. Adrenals/Urinary Tract: Adrenal glands are normal. Kidneys are normal in size without hydronephrosis or nephrolithiasis. Ureters and bladder are normal. Stomach/Bowel: Stomach and small bowel are normal. Prior appendectomy. Colon is within normal. Vascular/Lymphatic: Minimal calcified plaque over the  abdominal aorta and iliac arteries. Remaining vascular structures are within normal. No adenopathy. Reproductive: Within normal. Other: No free fluid. No free peritoneal air. Musculoskeletal: Mild degenerate change of the spine and hips.  IMPRESSION: Cholelithiasis. Subtle nonspecific hazy attenuation of the fat adjacent the porta hepatis which may be seen with cholecystitis or pancreatitis. Minimal aortic atherosclerosis.  NM Hepatobiliary Liver Func (09/07/2016) FINDINGS: Prompt uptake and biliary excretion of activity by the liver is seen. Gallbladder activity is visualized, consistent with patency of cystic duct. Biliary activity passes into small bowel, consistent with patent common bile duct.  IMPRESSION: Normal exam. No evidence of cholecystitis or bile duct obstruction.  US Abdomen Limited RUQ (09/06/2016) FINDINGS: Gallbladder: Multiple shadowing stones measuring up to 8.5 mm. Normal wall thickness. Negative sonographic Murphy's. Common bile duct: Diameter: Normal at 4.9 mm Liver: Coarse echogenic liver consistent with fatty infiltration. No ascites.  IMPRESSION: 1. Cholelithiasis without other sonographic features to suggest acute cholecystitis. No biliary dilatation. 2. Fatty infiltration of the liver  DG Chest 2 View (09/06/2016) FINDINGS: The heart is enlarged but stable. The mediastinal and hilar contours are within normal limits and unchanged. Mild vascular congestion and chronic bronchitic changes but no pulmonary edema, pleural effusions or focal infiltrates.  IMPRESSION: Cardiac enlargement with mild vascular congestion and chronic bronchitic changes but no infiltrates, edema or effusions.  Results/Tests Pending at Time of Discharge: None  Discharge Medications:  Allergies as of 09/14/2016      Reactions   Xarelto [rivaroxaban] Other (See Comments)   Internal bleeding   Ancef [cefazolin] Nausea And Vomiting   Levaquin [levofloxacin In  D5w] Other (See  Comments)   "afib"   Lyrica [pregabalin] Hives   Tamiflu [oseltamivir Phosphate] Other (See Comments)   "water blisters"   Zoloft [sertraline Hcl] Other (See Comments)   Jaw problems, jittery   Augmentin [amoxicillin-pot Clavulanate] Itching   Ciprofloxacin Itching   Haldol [haloperidol] Other (See Comments)   Restless leg   Nsaids Diarrhea   Penicillins Itching, Nausea And Vomiting, Rash   Has patient had a PCN reaction causing immediate rash, facial/tongue/throat swelling, SOB or lightheadedness with hypotension: Yes Has patient had a PCN reaction causing severe rash involving mucus membranes or skin necrosis: No Has patient had a PCN reaction that required hospitalization No Has patient had a PCN reaction occurring within the last 10 years: Yes If all of the above answers are "NO", then may proceed with Cephalosporin use.   Topamax [topiramate] Nausea Only      Medication List    STOP taking these medications   diltiazem 30 MG tablet Commonly known as:  CARDIZEM   metolazone 2.5 MG tablet Commonly known as:  ZAROXOLYN   potassium chloride SA 20 MEQ tablet Commonly known as:  K-DUR,KLOR-CON     TAKE these medications   albuterol (2.5 MG/3ML) 0.083% nebulizer solution Commonly known as:  PROVENTIL Take 2.5 mg by nebulization every 6 (six) hours as needed for wheezing or shortness of breath.   atorvastatin 40 MG tablet Commonly known as:  LIPITOR Take 1 tablet (40 mg total) by mouth daily.   BASAGLAR KWIKPEN 100 UNIT/ML Sopn Inject 25 units into the skin daily What changed:  additional instructions   cetirizine 10 MG tablet Commonly known as:  ZYRTEC Take 10 mg by mouth at bedtime.   docusate sodium 100 MG capsule Commonly known as:  COLACE Take 100 mg by mouth daily.   escitalopram 10 MG tablet Commonly known as:  LEXAPRO Take 10 mg by mouth at bedtime.   fenofibrate 54 MG tablet Take 1 tablet (54 mg total) by mouth daily.   ferrous sulfate 324 (65 Fe)  MG Tbec Take 1 tablet by mouth daily.   fluticasone 50 MCG/ACT nasal spray Commonly known as:  FLONASE Place 1 spray into both nostrils daily as needed for allergies or rhinitis.   furosemide 40 MG tablet Commonly known as:  LASIX Take 1 tablet (40 mg total) by mouth 2 (two) times daily. What changed:  how much to take   insulin lispro 100 UNIT/ML KiwkPen Commonly known as:  HUMALOG KWIKPEN CBG 70-120: 0 units; 121-150: 1 U; 151-200: 2 U; 201-250: 3 U; 251-300: 5 U; 301-350: 7 U; 351-400: 9 U What changed:  additional instructions   ipratropium 0.02 % nebulizer solution Commonly known as:  ATROVENT Take 0.5 mg by nebulization every 6 (six) hours as needed for wheezing or shortness of breath.   metFORMIN 500 MG 24 hr tablet Commonly known as:  GLUCOPHAGE-XR Take 1 tablet (500 mg total) by mouth 2 (two) times daily.   metoprolol succinate 25 MG 24 hr tablet Commonly known as:  TOPROL-XL Take 1 tablet (25 mg total) by mouth 2 (two) times daily.   mirtazapine 15 MG tablet Commonly known as:  REMERON Take 15 mg by mouth at bedtime.   modafinil 200 MG tablet Commonly known as:  PROVIGIL Take 1 tablet (200 mg total) by mouth daily.   montelukast 10 MG tablet Commonly known as:  SINGULAIR Take 1 tablet (10 mg total) by mouth at bedtime.   nitroGLYCERIN 0.4 MG SL tablet  Commonly known as:  NITROSTAT Place 1 tablet (0.4 mg total) under the tongue every 5 (five) minutes as needed for chest pain.   nystatin powder Commonly known as:  nystatin Apply topically 2 (two) times daily. Apply under breasts   ondansetron 4 MG disintegrating tablet Commonly known as:  ZOFRAN-ODT Take 1 tablet (4 mg total) by mouth 2 (two) times daily as needed for nausea or vomiting.   oxyCODONE-acetaminophen 5-325 MG tablet Commonly known as:  PERCOCET/ROXICET Take 1 tablet by mouth every 8 (eight) hours as needed for severe pain.   OXYGEN Inhale 3 L into the lungs continuous. 3 liters 24/7    pantoprazole 20 MG tablet Commonly known as:  PROTONIX Take 1 tablet (20 mg total) by mouth 2 (two) times daily before a meal.   PRESCRIPTION MEDICATION Inhale into the lungs See admin instructions. Use BIPAP every time laying down   primidone 50 MG tablet Commonly known as:  MYSOLINE Take 1 tablet (50 mg total) by mouth at bedtime.   PROBIOTIC PO Take 1 tablet by mouth at bedtime.   triamcinolone lotion 0.1 % Commonly known as:  KENALOG Apply 1 application topically 3 (three) times daily. For up to 14 days and as needed   VITAMIN A PO Take 1 tablet by mouth daily.   Vitamin D3 400 units Caps Take 800 Units by mouth daily. What changed:  how much to take  when to take this   VITAMIN E PO Take 1 tablet by mouth daily.   warfarin 4 MG tablet Commonly known as:  COUMADIN Take 1 tablet daily except 1/2 tablet on Wednesdays and Saturdays What changed:  how much to take  how to take this  when to take this  additional instructions       Discharge Instructions: Please refer to Patient Instructions section of EMR for full details.  Patient was counseled important signs and symptoms that should prompt return to medical care, changes in medications, dietary instructions, activity restrictions, and follow up appointments.   Follow-Up Appointments: Follow-up Information    Lavon Paganini, MD. Go on 09/17/2016.   Specialty:  Family Medicine Why:  2:45 pm hospital follow-up appointment Contact information: [place] [address] [phone]           [place] Bing, DO 09/16/2016, 1:18 PM PGY-1, [place]

## 2016-09-09 NOTE — Progress Notes (Addendum)
Progress Note  Patient Name: Denise Freeman [facility] Date of Encounter: 09/09/2016  Primary Cardiologist: Dr Debara Pickett  Subjective   She denies any chest pain or pressure today. Still complains of SOB  Inpatient Medications    Scheduled Meds: . atorvastatin  40 mg Oral q1800  . diltiazem  120 mg Oral Daily  . escitalopram  10 mg Oral QHS  . fenofibrate  54 mg Oral Daily  . furosemide  60 mg Intravenous BID  . insulin aspart  0-9 Units Subcutaneous TID WC  . insulin glargine  25 Units Subcutaneous Q2200  . loratadine  10 mg Oral QHS  . mirtazapine  15 mg Oral QHS  . modafinil  200 mg Oral Daily  . montelukast  10 mg Oral QHS  . pantoprazole  20 mg Oral BID AC  . predniSONE  50 mg Oral Q breakfast  . sodium chloride flush  3 mL Intravenous Q12H  . tiotropium  18 mcg Inhalation Daily  . Warfarin - Pharmacist Dosing Inpatient   Does not apply q1800   Continuous Infusions:  PRN Meds: sodium chloride, ipratropium-albuterol, ondansetron (ZOFRAN) IV, oxyCODONE-acetaminophen, sodium chloride flush   Vital Signs    Vitals:   09/08/16 1944 09/09/16 0502 09/09/16 0821 09/09/16 0837  BP: 108/60 (!) 143/57  (!) 131/59  Pulse: 60 (!) 55  (!) 103  Resp: 18 18    Temp: 98 F (36.7 C) 98.4 F (36.9 C)    TempSrc: Oral Oral    SpO2: 100% 100% 97%   Weight:  196 lb 3.2 oz (89 kg)    Height:        Intake/Output Summary (Last 24 hours) at 09/09/16 1126 Last data filed at 09/09/16 1006  Gross per 24 hour  Intake              480 ml  Output             2051 ml  Net            -1571 ml   Filed Weights   09/06/16 2302 09/08/16 0618 09/09/16 0502  Weight: 196 lb 6.4 oz (89.1 kg) 193 lb 3.2 oz (87.6 kg) 196 lb 3.2 oz (89 kg)    Telemetry    Atrial Fibrillation with mildly elevated HR and PVCs- Personally Reviewed   Physical Exam   GEN: well developed well nourished in NAD Neck: supple, no JVD or bruits Cardiac: irregularly irregular with no M/R/G Respiratory: decreased BS  throughout with some exp wheezes GI: Soft and mildly tender to palpation MS: No LE edema noted; No deformity Neuro:  A&O x 3 Psych: Normal affect  Labs    Chemistry  Recent Labs Lab 09/06/16 1527 09/07/16 0707 09/08/16 0242 09/09/16 0512  NA 136 136 139 134*  K 3.5 3.4* 3.2* 3.5  CL 87* 88* 91* 89*  CO2 32 35* 35* 31  GLUCOSE 221* 193* 204* 210*  BUN 29* 32* 33* 37*  CREATININE 1.27* 1.27* 1.37* 1.58*  CALCIUM 9.0 8.9 9.0 8.9  PROT 7.1  --   --   --   ALBUMIN 4.1  --   --   --   AST 30  --   --   --   ALT 15  --   --   --   ALKPHOS 65  --   --   --   BILITOT 0.4  --   --   --   GFRNONAA 43* 43* 40* 33*  GFRAA  50* 50* 46* 39*  ANIONGAP 17* 13 13 14      Hematology  Recent Labs Lab 09/07/16 0408 09/08/16 0242 09/09/16 0512  WBC 12.4* 11.4* 12.7*  RBC 3.86* 3.80* 3.83*  HGB 10.7* 10.5* 10.8*  HCT 33.7* 33.2* 33.4*  MCV 87.3 87.4 87.2  MCH 27.7 27.6 28.2  MCHC 31.8 31.6 32.3  RDW 12.2 12.2 12.5  PLT 255 263 287    Cardiac Enzymes  Recent Labs Lab 09/06/16 2219 09/07/16 0408 09/07/16 0707  TROPONINI 0.05* 0.05* 0.04*     Recent Labs Lab 09/06/16 1842  TROPIPOC 0.05     BNP  Recent Labs Lab 09/06/16 1756  BNP 680.4*       Radiology    Nm Hepatobiliary Liver Func  Result Date: 09/07/2016 CLINICAL DATA:  Abdominal pain. EXAM: NUCLEAR MEDICINE HEPATOBILIARY IMAGING TECHNIQUE: Sequential images of the abdomen were obtained out to 60 minutes following intravenous administration of radiopharmaceutical. RADIOPHARMACEUTICALS:  5.5 mCi Tc-6m  Choletec IV COMPARISON:  None. FINDINGS: Prompt uptake and biliary excretion of activity by the liver is seen. Gallbladder activity is visualized, consistent with patency of cystic duct. Biliary activity passes into small bowel, consistent with patent common bile duct. IMPRESSION: Normal exam.  No evidence of cholecystitis or bile duct obstruction. Electronically Signed   By: Franki Cabot M.D.   On: 09/07/2016  13:19   Ct Renal Stone Study  Result Date: 09/07/2016 CLINICAL DATA:  Right upper quadrant pain. HIDA scan negative. Rare bacteria on urinalysis. EXAM: CT ABDOMEN AND PELVIS WITHOUT CONTRAST TECHNIQUE: Multidetector CT imaging of the abdomen and pelvis was performed following the standard protocol without IV contrast. COMPARISON:  Chest CT 03/12/2014, right upper quadrant ultrasound 09/06/2016 and HIDA scan 09/07/2016. FINDINGS: Lower chest: Lung bases are within normal.  Mild cardiomegaly. Hepatobiliary: Liver and biliary tree are within normal. There is mild cholelithiasis present. Subtle nonspecific hazy attenuation within the fat adjacent over the porta hepatis. Pancreas: Normal. Spleen: Normal. Adrenals/Urinary Tract: Adrenal glands are normal. Kidneys are normal in size without hydronephrosis or nephrolithiasis. Ureters and bladder are normal. Stomach/Bowel: Stomach and small bowel are normal. Prior appendectomy. Colon is within normal. Vascular/Lymphatic: Minimal calcified plaque over the abdominal aorta and iliac arteries. Remaining vascular structures are within normal. No adenopathy. Reproductive: Within normal. Other: No free fluid.  No free peritoneal air. Musculoskeletal: Mild degenerate change of the spine and hips. IMPRESSION: Cholelithiasis. Subtle nonspecific hazy attenuation of the fat adjacent the porta hepatis which may be seen with cholecystitis or pancreatitis. Minimal aortic atherosclerosis. Electronically Signed   By: Marin Olp M.D.   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 Cath 10/14 with normal coronaries. Echo this admission shows EF 30-35 (worse compared to previous) with anteroseptal and apical hypokinesis; RAE and RVE.  Assessment & Plan    1 Acute  on chronic combined systolic/diastolic congestive heart failure  - her SOB is likely multifactorial from COPD as well as CHF.   - She does not appear to be markedly volume overloaded on examination and BNP was mildly elevated at 680 (chronically in the 200's in the past) - change back to PO lasix 80mg  BID - follow renal function closely   2 Permanent atrial fibrillation  - HR poorly controlled - Agree with Dr. Stanford Breed that difficult situation with  use of BB in setting of severe lung disease but her LVF is reduced and CCB may aggravate this.  She is not wheezing today so I will start Toprol XL 25mg  BID (B1 selective) and stop Cardizem.  Would like to avoid use of Amio given her underlying lung disease. - CHADSvasc 6. Continue coumadin with goal INR 2-3. INR today 2.33.  3 Severe COPD on home O2 - Continue oxygen per TRH. - This is likely contributing to her dyspnea. Needs aggressive pulmonary toilet. Management per primary care.  4 Acute on chronic stage III kidney disease - Creatinine trending upward at 1.58.   - Will change back to home dose of Lasix PO 80mg  BID - follow renal function closely  5 hyperlipidemia - LDL 153 in 04/2016.   - Repeat FLP  - continue statin.  6 chest pain - troponin minimally elevated with flat trend.  This does not appear to be consistent with acute coronary syndrome. Previous catheterization revealed normal coronary arteries.  ? If CP related to underlying GB disease.  7 Dilated Cardiomyopathy-LV function is nearly reduced compared to previous. Etiology unclear. Previous catheterization October 2014 showed normal coronary arteries. This could be tachycardia mediated from atrial fibrillation with RVR. She has severe lung disease and would not be a candidate for coronary artery bypass and graft.  - Will arrange a [place] nuclear study to screen for significant ischemia .  If she is noted to have severe ischemia then would proceed with catheterization to  see if there would be a lesion amenable to PCI.  Blood pressure is too soft to add an ACE inhibitor at this point but could be considered later.   8 Abdominal pain - gallstones with normal HIDA - felt to be poor candidate for surgical intervention per surgery.  Plan for medical management and Perc drain if she fails med management.  Signed, Fransico Him, MD  09/09/2016, 11:26 AM

## 2016-09-10 ENCOUNTER — Inpatient Hospital Stay (HOSPITAL_COMMUNITY): Payer: Medicare Other

## 2016-09-10 ENCOUNTER — Other Ambulatory Visit: Payer: Self-pay

## 2016-09-10 DIAGNOSIS — R079 Chest pain, unspecified: Secondary | ICD-10-CM

## 2016-09-10 LAB — LIPID PANEL
Cholesterol: 148 mg/dL (ref 0–200)
HDL: 24 mg/dL — AB (ref >40)
LDL Cholesterol: 94 mg/dL (ref 0–99)
Total CHOL/HDL Ratio: 6.2 RATIO
Triglycerides: 150 mg/dL — ABNORMAL HIGH (ref <150)
VLDL: 30 mg/dL (ref 0–40)

## 2016-09-10 LAB — GLUCOSE, CAPILLARY
GLUCOSE-CAPILLARY: 173 mg/dL — AB (ref 65–99)
GLUCOSE-CAPILLARY: 274 mg/dL — AB (ref 65–99)
GLUCOSE-CAPILLARY: 253 mg/dL — AB (ref 65–99)
Glucose-Capillary: 212 mg/dL — ABNORMAL HIGH (ref 65–99)
Glucose-Capillary: 236 mg/dL — ABNORMAL HIGH (ref 65–99)

## 2016-09-10 LAB — CBC
HCT: 33.5 % — ABNORMAL LOW (ref 36.0–46.0)
Hemoglobin: 10.7 g/dL — ABNORMAL LOW (ref 12.0–15.0)
MCH: 27.9 pg (ref 26.0–34.0)
MCHC: 31.9 g/dL (ref 30.0–36.0)
MCV: 87.2 fL (ref 78.0–100.0)
PLATELETS: 338 10*3/uL (ref 150–400)
RBC: 3.84 MIL/uL — ABNORMAL LOW (ref 3.87–5.11)
RDW: 12.4 % (ref 11.5–15.5)
WBC: 14 10*3/uL — AB (ref 4.0–10.5)

## 2016-09-10 LAB — BASIC METABOLIC PANEL
Anion gap: 14 (ref 5–15)
BUN: 53 mg/dL — AB (ref 6–20)
CALCIUM: 8.4 mg/dL — AB (ref 8.9–10.3)
CO2: 30 mmol/L (ref 22–32)
CREATININE: 2 mg/dL — AB (ref 0.44–1.00)
Chloride: 89 mmol/L — ABNORMAL LOW (ref 101–111)
GFR calc Af Amer: 29 mL/min — ABNORMAL LOW (ref >60)
GFR, EST NON AFRICAN AMERICAN: 25 mL/min — AB (ref >60)
Glucose, Bld: 286 mg/dL — ABNORMAL HIGH (ref 65–99)
Potassium: 3.8 mmol/L (ref 3.5–5.1)
Sodium: 133 mmol/L — ABNORMAL LOW (ref 135–145)

## 2016-09-10 LAB — NM MYOCAR MULTI W/SPECT W/WALL MOTION / EF
CHL CUP NUCLEAR SDS: 0
CHL CUP NUCLEAR SRS: 7
CHL CUP NUCLEAR SSS: 5
CSEPEW: 1 METS
Exercise duration (min): 5 min
Exercise duration (sec): 28 s
LHR: 0.44
LV dias vol: 86 mL (ref 46–106)
LVSYSVOL: 64 mL
Peak HR: 83 {beats}/min
Rest HR: 64 {beats}/min
TID: 1.1

## 2016-09-10 LAB — PROTIME-INR
INR: 2.79
Prothrombin Time: 30 seconds — ABNORMAL HIGH (ref 11.4–15.2)

## 2016-09-10 MED ORDER — REGADENOSON 0.4 MG/5ML IV SOLN
INTRAVENOUS | Status: AC
  Administered 2016-09-10: 0.4 mg via INTRAVENOUS
  Filled 2016-09-10: qty 5

## 2016-09-10 MED ORDER — FUROSEMIDE 40 MG PO TABS
40.0000 mg | ORAL_TABLET | Freq: Two times a day (BID) | ORAL | Status: DC
  Administered 2016-09-11 – 2016-09-14 (×5): 40 mg via ORAL
  Filled 2016-09-10 (×8): qty 1

## 2016-09-10 MED ORDER — REGADENOSON 0.4 MG/5ML IV SOLN
0.4000 mg | Freq: Once | INTRAVENOUS | Status: AC
  Administered 2016-09-10: 0.4 mg via INTRAVENOUS
  Filled 2016-09-10: qty 5

## 2016-09-10 MED ORDER — FERROUS SULFATE 325 (65 FE) MG PO TABS
325.0000 mg | ORAL_TABLET | Freq: Every day | ORAL | Status: DC
  Administered 2016-09-10 – 2016-09-13 (×4): 325 mg via ORAL
  Filled 2016-09-10 (×4): qty 1

## 2016-09-10 MED ORDER — ORAL CARE MOUTH RINSE
15.0000 mL | Freq: Two times a day (BID) | OROMUCOSAL | Status: DC
  Administered 2016-09-11 – 2016-09-12 (×2): 15 mL via OROMUCOSAL

## 2016-09-10 MED ORDER — PRIMIDONE 50 MG PO TABS
50.0000 mg | ORAL_TABLET | Freq: Every day | ORAL | Status: DC
  Administered 2016-09-10 – 2016-09-13 (×4): 50 mg via ORAL
  Filled 2016-09-10 (×5): qty 1

## 2016-09-10 MED ORDER — FLUTICASONE PROPIONATE 50 MCG/ACT NA SUSP
2.0000 | Freq: Every day | NASAL | Status: DC
  Administered 2016-09-10 – 2016-09-12 (×3): 2 via NASAL
  Filled 2016-09-10 (×2): qty 16

## 2016-09-10 NOTE — Progress Notes (Signed)
Inpatient Diabetes Program Recommendations  AACE/ADA: New Consensus Statement on Inpatient Glycemic Control (2015)  Target Ranges:  Prepandial:   less than 140 mg/dL      Peak postprandial:   less than 180 mg/dL (1-2 hours)      Critically ill patients:  140 - 180 mg/dL   Lab Results  Component Value Date   GLUCAP 274 (H) 09/10/2016   HGBA1C 7.5 07/22/2016    Review of Glycemic Control  Diabetes history: DM2, Obesity, steroids this admission  Outpatient Diabetes medications: Glargine 40 units daily @ HS, Humalog 24-32 units TID AC, Metformin 500 mg BID  Current orders for Inpatient glycemic control: Lantus 25 units daily @ HS, sensitive correction scale Novolog 0-9 units TIDAC  Inpatient Diabetes Program Recommendations:   Insulin - Correction: Please consider adding correction scale at bedtime of Novolog 0-5 units QHS.  Insulin - Meal Coverage: Please consider adding Novolog 3 units TID AC if patient eats > 50% of meal.  Thank you,  Windy Carina, RN, MSN Diabetes Coordinator Inpatient Diabetes Program [phone] (Team Pager)

## 2016-09-10 NOTE — Progress Notes (Signed)
Patient presented for Lexiscan. Tolerated procedure well. Pending final stress imaging result.  Daune Perch, NP

## 2016-09-10 NOTE — Progress Notes (Signed)
Family Medicine Teaching Service Daily Progress Note Intern Pager: [phone]  Patient name: Denise Freeman [facility] Medical record number: [id number] Date of birth: [birth year] Age: 66 y.o. Gender: female  Primary Care Provider: Darci Needle, MD Consultants: Cardiology, General surgery Code Status: FULL  Pt Overview and Major Events to Date:  04/06: Admit for abdominal pain and SOB 04/07: HIDA negative  Assessment and Plan: Denise Freeman is a 66 y.o. female presenting with RUQ pain and shortness of breath. 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    #RUQ Abdominal Pain: Vitals stable and afebrile. Pain likely from symptomatic cholelithiasis. - Surgery recs: consider possible laparoscopic cholecystectomy.  - Perc drain not an option per surgery - pulm preop clearance -likely ICU level care/ventilator/or BIPAP after procedure - Hold Coumadin today. Check INR daily until <2, start heparin  #Acute on Chronic Congestive HFrEF: UO 1350 over last 24h. Appears euvolemic.  - Appreciate Cards recs: ischemic workup, consider Lexiscan - continue PO Lasix 80 mg BID - DC'd dilt, added Toprol 25 mg BID  #Atrial Fibrillation with mild RVR: Chronic. Rate control is a challenge w/CHF, controlled today. On Coumadin INR therapeutic. - Coumadin per pharmacy  - Toprol 25 mg BID per cards, cardizem discontinued -  Per cards recs try to avoid use of amio given underlying lung disease  #Insulin-dependent diabetes mellitus, chronic, stable:Marland Kitchen Home: Lantus 40units at bedtime, SSI, Metformin. CBGs 173, 212 overnight, ?no SSI given - holding Metformin - continue Lantus 25u QHS - CBGs TID, QHS, sensitive SSI    #COPD  Asthma: Possible acute on chronic exacerbation. Stable. On 3L [place] at  home. Was on budesonide at some point but not in current medication list.  - Singulair with PRN duoneb - Atrovent, Levalbuterol  #Hyperlipidemia: Chornic. On fenofibrate. - Fenofibrate   #Obstructive sleep apnea: Chronic. On BiPAP. - BiPAP QHS - Continue Modafinil   #Vitamin D deficiency: Chronic. On supplementation. --Continue Vit D   #Depression: No active depression since admission. --Remeron 15 mg QHS  #Chronic Pain: Continue home Percocet regimen  #Chronic kidney disease stage III: Stable. Baseline appears to be 1.1-1.3 --Avoid nephrotoxic agents  #Hypokalemia: Acute. Resolved.  FEN/GI: Heart healthy card modified diet Prophylaxis: Lovenox SQ  Disposition: Pending Lexiscan per cardiology recommendations. May need lap chole.  Subjective:  No acute events overnight. Patient seen and examined after lexiscan. States her biggest concern with not doing surgery is potential risks of not doing surgery.  Objective: Temp:  [98.3 F (36.8 C)] 98.3 F (36.8 C) (04/10 0703) Pulse Rate:  [60-103] 60 (04/10 0703) Resp:  [18-22] 18 (04/10 0703) BP: (101-131)/(55-76) 101/76 (04/10 0703) SpO2:  [95 %-100 %] 100 % (04/10 0703) Weight:  [200 lb 4.8 oz (90.9 kg)] 200 lb 4.8 oz (90.9 kg) (04/10 0353) Physical Exam: General: NAD, sits in bed comfortably, eating lunch Cardiovascular: RRR, no m/r/g, trace edema in b/l LE Respiratory: CTA bil, no W/R/R Abdomen: mildly tender to palpation in RUQ, normoactive bowel sounds present Extremities: trace edema bilateral lower extremities, 2+ DP pulses b/l  Laboratory:  Recent Labs Lab 09/07/16 0408 09/08/16 0242 09/09/16 0512  WBC 12.4* 11.4* 12.7*  HGB 10.7* 10.5* 10.8*  HCT 33.7* 33.2* 33.4*  PLT 255 263 287    Recent Labs Lab 09/06/16 1527 09/07/16  9292 09/08/16 0242 09/09/16 0512  NA 136 136 139 134*  K 3.5 3.4* 3.2* 3.5  CL 87* 88* 91* 89*  CO2 32 35* 35* 31  BUN 29* 32* 33* 37*  CREATININE 1.27* 1.27* 1.37* 1.58*   CALCIUM 9.0 8.9 9.0 8.9  PROT 7.1  --   --   --   BILITOT 0.4  --   --   --   ALKPHOS 65  --   --   --   ALT 15  --   --   --   AST 30  --   --   --   GLUCOSE 221* 193* 204* 210*    Troponin (Point of Care Test) No results for input(s): TROPIPOC in the last 72 hours.  Imaging/Diagnostic Tests: No results found.   [place] Bing, DO 09/10/2016, 7:16 AM PGY-1, [place] Intern pager: [phone], text pages welcome

## 2016-09-10 NOTE — Consult Note (Signed)
[facility] CM Inpatient Consult   09/10/2016  Denise Freeman 04/13/1951 [id number]   Requested by the patient for follow up. Met with the patient and her husband Robert regarding medication issues and [facility] Care Management for post hospital follow up. Consent form obtained and folder with [facility] Care Management contact information given.  Patient concerned for medications and cost again today.  Will have [facility] Pharmacist to assess for education needs and cost assessment.  Will continue to follow.  Nurse Practitioner came to speak with the patient and spouse.  Will continue to follow for needs. For questions, please contact:   , RN BSN CCM [facility] Liaison  [phone] business mobile phone Toll free office [phone]   

## 2016-09-10 NOTE — Progress Notes (Signed)
Patient refusing breathing treatment at this time. Patient stated she does not feel she needs it at this time. She is resting comfortably on home nasal cpap. No distress noted.

## 2016-09-10 NOTE — Progress Notes (Signed)
Patient with no complaints or concerns during 7pm - 7am shift. Patient has been NPO after midnight.   Garrett Mitchum, RN

## 2016-09-10 NOTE — Progress Notes (Signed)
[facility] Progress Note     Subjective: CC: constant RUQ pain and SOB Just returned from Denise Freeman. RUQ soreness, worse with laying on right side. Denies nausea/vomiting.   Objective: Vital signs in last 24 hours: Temp:  [97.4 F (36.3 C)-98.3 F (36.8 C)] 97.4 F (36.3 C) (04/10 0730) Pulse Rate:  [60-93] 91 (04/10 0932) Resp:  [18-22] 18 (04/10 0730) BP: (101-121)/(55-96) 110/89 (04/10 0932) SpO2:  [95 %-100 %] 99 % (04/10 0801) Weight:  [90.9 kg (200 lb 4.8 oz)] 90.9 kg (200 lb 4.8 oz) (04/10 0353) Last BM Date: 09/09/16  Intake/Output from previous day: 04/09 0701 - 04/10 0700 In: 520 [P.O.:520] Out: 1351 [Urine:1350; Stool:1] Intake/Output this shift: No intake/output data recorded.  PE: Gen:  Alert, NAD, pleasant Pulm: increased respiratory effort on  Abd: Soft, obese, TTP RUQ without peritonitis or guarding, non-distended Psych: A&Ox3  Lab Results:   Recent Labs  09/08/16 0242 09/09/16 0512  WBC 11.4* 12.7*  HGB 10.5* 10.8*  HCT 33.2* 33.4*  PLT 263 287   BMET  Recent Labs  09/08/16 0242 09/09/16 0512  NA 139 134*  K 3.2* 3.5  CL 91* 89*  CO2 35* 31  GLUCOSE 204* 210*  BUN 33* 37*  CREATININE 1.37* 1.58*  CALCIUM 9.0 8.9   PT/INR  Recent Labs  09/08/16 0242 09/09/16 0512  LABPROT 26.8* 25.9*  INR 2.42 2.33   CMP     Component Value Date/Time   NA 134 (L) 09/09/2016 0512   K 3.5 09/09/2016 0512   CL 89 (L) 09/09/2016 0512   CO2 31 09/09/2016 0512   GLUCOSE 210 (H) 09/09/2016 0512   BUN 37 (H) 09/09/2016 0512   CREATININE 1.58 (H) 09/09/2016 0512   CREATININE 1.15 (H) 07/18/2016 1023   CALCIUM 8.9 09/09/2016 0512   PROT 7.1 09/06/2016 1527   ALBUMIN 4.1 09/06/2016 1527   AST 30 09/06/2016 1527   ALT 15 09/06/2016 1527   ALKPHOS 65 09/06/2016 1527   BILITOT 0.4 09/06/2016 1527   GFRNONAA 33 (L) 09/09/2016 0512   GFRNONAA 50 (L) 07/18/2016 1023   GFRAA 39 (L) 09/09/2016 0512   GFRAA 58 (L) 07/18/2016 1023    Lipase     Component Value Date/Time   LIPASE 13 09/06/2016 1527   Studies/Results: No results found.  Anti-infectives: Anti-infectives    None     Assessment/Plan Likely symptomatic cholelithiasis - I think symptoms are likely gb related. - appreciate cardiology and critical care seeing patient for pre-op assessment/optimization - Echo this admission worse compared to previous and Lexiscan results pending; due to a.fib/CKD/ significant pulmonary risk factors pt would likely remain on ventilator s/p lap chole.  -perc drain not recommended for symptomatic cholelithiasis without cholecystitis  - will discuss further surgical planning with MD     LOS: 4 days    Jill Alexanders , [facility] Surgery 09/10/2016, 11:36 AM Pager: [phone] Consults: [phone] Mon-Fri 7:00 am-4:30 pm Sat-Sun 7:00 am-11:30 am

## 2016-09-10 NOTE — Progress Notes (Signed)
Progress Note  Patient Name: Denise Freeman [facility] Date of Encounter: 09/10/2016  Primary Cardiologist: Dr Debara Pickett  Subjective   Still having SOB but no further CP.  Inpatient Medications    Scheduled Meds: . atorvastatin  40 mg Oral q1800  . budesonide (PULMICORT) nebulizer solution  0.5 mg Nebulization BID  . escitalopram  10 mg Oral QHS  . fenofibrate  54 mg Oral Daily  . ferrous sulfate  325 mg Oral QHS  . fluticasone  2 spray Each Nare Daily  . furosemide  80 mg Oral BID  . insulin aspart  0-9 Units Subcutaneous TID WC  . insulin glargine  25 Units Subcutaneous Q2200  . ipratropium  0.5 mg Nebulization TID  . levalbuterol  0.63 mg Nebulization TID  . loratadine  10 mg Oral QHS  . mouth rinse  15 mL Mouth Rinse BID  . metoprolol succinate  25 mg Oral BID  . mirtazapine  15 mg Oral QHS  . modafinil  200 mg Oral Daily  . montelukast  10 mg Oral QHS  . pantoprazole  20 mg Oral BID AC  . primidone  50 mg Oral QHS  . sodium chloride flush  3 mL Intravenous Q12H   Continuous Infusions:  PRN Meds: sodium chloride, levalbuterol, ondansetron (ZOFRAN) IV, oxyCODONE-acetaminophen, sodium chloride flush   Vital Signs    Vitals:   09/10/16 0929 09/10/16 0931 09/10/16 0932 09/10/16 1400  BP: (!) 121/94 (!) 120/96 110/89 (!) 112/57  Pulse: 64 73 91 62  Resp:      Temp:    97.7 F (36.5 C)  TempSrc:      SpO2:    100%  Weight:      Height:        Intake/Output Summary (Last 24 hours) at 09/10/16 1638 Last data filed at 09/10/16 1600  Gross per 24 hour  Intake              600 ml  Output             1002 ml  Net             -402 ml   Filed Weights   09/08/16 0618 09/09/16 0502 09/10/16 0353  Weight: 193 lb 3.2 oz (87.6 kg) 196 lb 3.2 oz (89 kg) 200 lb 4.8 oz (90.9 kg)    Telemetry    Atrial Fibrillation with mildly elevated HR and PVCs- Personally Reviewed   Physical Exam   GEN: WD, WN in NAD Neck: no JVD or bruit Cardiac: irregularly irregular, no  M/R/G Respiratory: decreased BS GI: soft MS: no LE edema Neuro:  A&O x3 Psych: normal affect  Labs    Chemistry  Recent Labs Lab 09/06/16 1527  09/08/16 0242 09/09/16 0512 09/10/16 1147  NA 136  < > 139 134* 133*  K 3.5  < > 3.2* 3.5 3.8  CL 87*  < > 91* 89* 89*  CO2 32  < > 35* 31 30  GLUCOSE 221*  < > 204* 210* 286*  BUN 29*  < > 33* 37* 53*  CREATININE 1.27*  < > 1.37* 1.58* 2.00*  CALCIUM 9.0  < > 9.0 8.9 8.4*  PROT 7.1  --   --   --   --   ALBUMIN 4.1  --   --   --   --   AST 30  --   --   --   --   ALT 15  --   --   --   --  ALKPHOS 65  --   --   --   --   BILITOT 0.4  --   --   --   --   GFRNONAA 43*  < > 40* 33* 25*  GFRAA 50*  < > 46* 39* 29*  ANIONGAP 17*  < > 13 14 14   < > = values in this interval not displayed.   Hematology  Recent Labs Lab 09/08/16 0242 09/09/16 0512 09/10/16 1147  WBC 11.4* 12.7* 14.0*  RBC 3.80* 3.83* 3.84*  HGB 10.5* 10.8* 10.7*  HCT 33.2* 33.4* 33.5*  MCV 87.4 87.2 87.2  MCH 27.6 28.2 27.9  MCHC 31.6 32.3 31.9  RDW 12.2 12.5 12.4  PLT 263 287 338    Cardiac Enzymes  Recent Labs Lab 09/06/16 2219 09/07/16 0408 09/07/16 0707  TROPONINI 0.05* 0.05* 0.04*     Recent Labs Lab 09/06/16 1842  TROPIPOC 0.05     BNP  Recent Labs Lab 09/06/16 1756  BNP 680.4*       Radiology    Nm Myocar Multi W/spect W/wall Motion / Ef  Result Date: 09/10/2016  There was no ST segment deviation noted during stress.  No T wave inversion was noted during stress.  Defect 1: There is a medium defect of moderate severity.  This is a high risk study.  Nuclear stress EF: 26%.  Medium size, moderate intensity fixed septal bundle branch/PVC-related artifact. No reversible ischemia. LVEF 26% with global hypokinesis and septal dyskinesis. This is a high risk study, given severely depressed LVEF.    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 Cath 10/14 with normal coronaries. Echo this admission shows EF 30-35 (worse compared to previous) with anteroseptal and apical hypokinesis; RAE and RVE.  Assessment & Plan    1 Acute on chronic combined systolic/diastolic congestive heart failure  - her SOB is likely multifactorial from COPD as well as CHF.   - She does not appear to be markedly volume overloaded on examination and BNP was mildly elevated at 680 (chronically in the 200's in the past) - creatinine has bumped to 2.  She has already gotten 80mg  lasix today so will hold dose tonight and change to 40mg  BID in am - follow renal function closely   2 Permanent atrial fibrillation  - HR well controlled - Difficult situation with use of BB in setting of severe lung disease but her LVF is reduced and CCB may aggravate this.  She was started on Toprol XL 25mg  BID (B1 selective) and stopped Cardizem and seems to be tolerating BB well.  Would like to avoid use of Amio for rate control given her underlying lung disease. - CHADSvasc 6. Continue coumadin with goal INR 2-3. INR today 2.33.  3 Severe COPD on home O2 - Continue oxygen per TRH. - This is likely contributing to her dyspnea. Needs aggressive pulmonary toilet. Management per primary care.  4 Acute on chronic stage III kidney disease - Creatinine trending upward ad now 2 - Will decrease Lasix to 40mg  BID - follow renal function closely  5 hyperlipidemia - LDL 94 today - continue statin  6 chest pain - troponin minimally elevated with flat trend.  This does not appear to be consistent with acute coronary syndrome. Previous catheterization revealed normal coronary arteries.  ? If CP related to underlying  GB disease.  7 Dilated Cardiomyopathy-LV function is nearly reduced compared to previous. Etiology unclear. Previous catheterization October 2014  showed normal coronary arteries. This could be tachycardia mediated from atrial fibrillation with RVR. She has severe lung disease and would not be a candidate for coronary artery bypass and graft.  - Nuclear stress test reviewed: There was no ST segment deviation noted during stress. No T wave inversion was noted during stress. There was a medium defect of moderate severity c/w septal BBB/PVC artifact with no ischemia.  ER calculated at 26% bu 30-35% on echo.  Read as high risk only due to LV dysfunction.  As there is no evidence of ischemia and trop was minimally elevated with flat trend and normal coronary arteries on prior cath in 2014, recommend medical management.  There worsening LVF is likely related to tachycardia induced DCM and hopefully will improve with rate control.      8 Abdominal pain - gallstones with normal HIDA - felt to be poor candidate for surgical intervention per surgery.  Plan for medical management and Perc drain if she fails med management.  Signed, Fransico Him, MD  09/10/2016, 4:38 PM

## 2016-09-10 NOTE — Care Management Important Message (Signed)
Important Message  Patient Details  Name: Denise Freeman MRN: [id number] Date of Birth: 1950/08/10   Medicare Important Message Given:  Yes    Annisten [place] [address] 09/10/2016, 12:18 PM

## 2016-09-11 DIAGNOSIS — R109 Unspecified abdominal pain: Secondary | ICD-10-CM

## 2016-09-11 LAB — PROTIME-INR
INR: 2.81
Prothrombin Time: 30.2 seconds — ABNORMAL HIGH (ref 11.4–15.2)

## 2016-09-11 LAB — GLUCOSE, CAPILLARY
GLUCOSE-CAPILLARY: 144 mg/dL — AB (ref 65–99)
GLUCOSE-CAPILLARY: 204 mg/dL — AB (ref 65–99)
GLUCOSE-CAPILLARY: 202 mg/dL — AB (ref 65–99)
Glucose-Capillary: 182 mg/dL — ABNORMAL HIGH (ref 65–99)

## 2016-09-11 LAB — BASIC METABOLIC PANEL
ANION GAP: 12 (ref 5–15)
BUN: 58 mg/dL — ABNORMAL HIGH (ref 6–20)
CHLORIDE: 93 mmol/L — AB (ref 101–111)
CO2: 29 mmol/L (ref 22–32)
Calcium: 8.3 mg/dL — ABNORMAL LOW (ref 8.9–10.3)
Creatinine, Ser: 2.07 mg/dL — ABNORMAL HIGH (ref 0.44–1.00)
GFR calc Af Amer: 28 mL/min — ABNORMAL LOW (ref >60)
GFR, EST NON AFRICAN AMERICAN: 24 mL/min — AB (ref >60)
Glucose, Bld: 168 mg/dL — ABNORMAL HIGH (ref 65–99)
POTASSIUM: 3.6 mmol/L (ref 3.5–5.1)
SODIUM: 134 mmol/L — AB (ref 135–145)

## 2016-09-11 LAB — CBC
HCT: 30.3 % — ABNORMAL LOW (ref 36.0–46.0)
Hemoglobin: 9.7 g/dL — ABNORMAL LOW (ref 12.0–15.0)
MCH: 27.7 pg (ref 26.0–34.0)
MCHC: 32 g/dL (ref 30.0–36.0)
MCV: 86.6 fL (ref 78.0–100.0)
Platelets: 316 10*3/uL (ref 150–400)
RBC: 3.5 MIL/uL — ABNORMAL LOW (ref 3.87–5.11)
RDW: 12.5 % (ref 11.5–15.5)
WBC: 13.2 10*3/uL — ABNORMAL HIGH (ref 4.0–10.5)

## 2016-09-11 LAB — BRAIN NATRIURETIC PEPTIDE: B NATRIURETIC PEPTIDE 5: 644.5 pg/mL — AB (ref 0.0–100.0)

## 2016-09-11 MED ORDER — LEVALBUTEROL HCL 0.63 MG/3ML IN NEBU
0.6300 mg | INHALATION_SOLUTION | Freq: Two times a day (BID) | RESPIRATORY_TRACT | Status: DC
  Administered 2016-09-11 – 2016-09-14 (×6): 0.63 mg via RESPIRATORY_TRACT
  Filled 2016-09-11 (×7): qty 3

## 2016-09-11 MED ORDER — INSULIN ASPART 100 UNIT/ML ~~LOC~~ SOLN
0.0000 [IU] | SUBCUTANEOUS | Status: DC
  Administered 2016-09-11 (×2): 3 [IU] via SUBCUTANEOUS
  Administered 2016-09-11 – 2016-09-13 (×4): 2 [IU] via SUBCUTANEOUS
  Administered 2016-09-14: 3 [IU] via SUBCUTANEOUS
  Administered 2016-09-14: 2 [IU] via SUBCUTANEOUS
  Administered 2016-09-14: 3 [IU] via SUBCUTANEOUS
  Administered 2016-09-14: 2 [IU] via SUBCUTANEOUS

## 2016-09-11 MED ORDER — INSULIN GLARGINE 100 UNIT/ML ~~LOC~~ SOLN: 28.0000 [IU] | Freq: Every day | SUBCUTANEOUS | Status: DC

## 2016-09-11 MED ORDER — INSULIN GLARGINE 100 UNIT/ML ~~LOC~~ SOLN
12.0000 [IU] | Freq: Every day | SUBCUTANEOUS | Status: DC
  Administered 2016-09-11 – 2016-09-12 (×2): 12 [IU] via SUBCUTANEOUS
  Filled 2016-09-11 (×3): qty 0.12

## 2016-09-11 MED ORDER — IPRATROPIUM BROMIDE 0.02 % IN SOLN
0.5000 mg | Freq: Two times a day (BID) | RESPIRATORY_TRACT | Status: DC
  Administered 2016-09-11 – 2016-09-14 (×6): 0.5 mg via RESPIRATORY_TRACT
  Filled 2016-09-11 (×7): qty 2.5

## 2016-09-11 MED ORDER — PHYTONADIONE 5 MG PO TABS
10.0000 mg | ORAL_TABLET | Freq: Once | ORAL | Status: AC
  Administered 2016-09-11: 10 mg via ORAL
  Filled 2016-09-11: qty 2

## 2016-09-11 NOTE — Progress Notes (Signed)
Family Medicine Teaching Service Daily Progress Note Intern Pager: [phone]  Patient name: Denise Freeman [facility] Medical record number: [id number] Date of birth: [birth year] Age: 66 y.o. Gender: female  Primary Care Provider: Darci Needle, MD Consultants: Cardiology, General surgery Code Status: FULL  Pt Overview and Major Events to Date:  04/06: Admit for abdominal pain and SOB 04/07: HIDA negative  Assessment and Plan: Denise Freeman is a 66 y.o. female presenting with RUQ pain and shortness of breath. 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    RUQ Abdominal Pain: Vitals stable and afebrile. Pain likely from symptomatic cholelithiasis, however patient is poor surgical candidate. Would likely be a difficult extubation per pulm. Has worsening Left ventricular function per cardiology. Strongly consider medical management, surgery would be high risk for this patient. - Surgery recs: consider possible laparoscopic cholecystectomy - Perc drain not an option per surgery - Hold Coumadin today. Check INR daily until <2, start heparin   Acute on Chronic Congestive HFrEF: UO 1200 over last 24h. Appears euvolemic. Nuc stress test high risk study for worsened LV dysfunction, EF 26% (30-35% on echo), however no ischemia, flat trop trend - Decreased to Lasix PO BID given worsening renal function - Continue Toprol 25 mg BID -  Appreciate cardiology recs - continue medical management as LVF may be related to tachycardia and induced DC Mand hopefully will improve with rate control  Atrial Fibrillation with mild RVR: Chronic. Rate 109 this AM.  - Holding coumadin as noted above - Toprol 25 mg BID per cards -  Per cards recs try to avoid use of amio given underlying lung  disease  Insulin-dependent diabetes mellitus, chronic, stable:Marland Kitchen Home: Lantus 40 units at bedtime, SSI, Metformin. CBGs 253, 144 overnight, required 17 units yesterday - holding Metformin - Increase Lantus 25u QHS >>> 28u QHS - CBGs TID, QHS, sensitive SSI    Acute on Chronic kidney disease stage III: Cr gradually trending up, 2.07 from 2.01 yesterday. Baseline appears to be 1.1-1.3 - Avoid nephrotoxic agents  COPD  Asthma: Possible acute on chronic exacerbation. Stable. On 3L [place] at home.  - Singulair with PRN duoneb - Atrovent, Levalbuterol  Hyperlipidemia: Chornic.  - cont home Fenofibrate   Obstructive sleep apnea: Chronic. On BiPAP. - BiPAP QHS - Continue Modafinil   Vitamin D deficiency: Chronic. On supplementation. --Continue Vit D   Depression: No active depression since admission. --Remeron 15 mg QHS  Chronic Pain: Continue home Percocet regimen  Hypokalemia: Acute. Resolved.  FEN/GI: Heart healthy card modified diet Prophylaxis: Lovenox SQ  Disposition: Pending Lexiscan per cardiology recommendations. May need lap chole.  Subjective:  No acute events overnight. Patient conversing ormally with me this morning, no acute complaints.  Objective: Temp:  [97.7 F (36.5 C)-98.7 F (37.1 C)] 98.7 F (37.1 C) (04/10 2102) Pulse Rate:  [62-109] 109 (04/11 0554) Resp:  [20] 20 (04/11 0554) BP: (110-120)/(57-96) 114/62 (04/11 0554) SpO2:  [95 %-100 %] 95 % (04/11 0759) Weight:  [202 lb 9.6 oz (91.9 kg)] 202 lb 9.6 oz (91.9 kg) (04/11 0554) Physical Exam: General: NAD, sits in bed comfortably Cardiovascular: RRR, no m/r/g, trace edema in LE bilaterally Respiratory: CTA bil, no W/R/R Abdomen: soft, nontender, nondistended Extremities: trace edema bilateral lower extremities, 2+ DP pulses b/l  Laboratory:  Recent Labs Lab 09/09/16 0512 09/10/16 1147 09/11/16 0319  WBC 12.7* 14.0* 13.2*  HGB 10.8* 10.7* 9.7*  HCT 33.4* 33.5* 30.3*  PLT 287 338 316     Recent Labs Lab 09/06/16 1527  09/09/16 0512 09/10/16 1147 09/11/16 0319  NA 136  < > 134* 133* 134*  K 3.5  < > 3.5 3.8 3.6  CL 87*  < > 89* 89* 93*  CO2 32  < > 31 30 29   BUN 29*  < > 37* 53* 58*  CREATININE 1.27*  < > 1.58* 2.00* 2.07*  CALCIUM 9.0  < > 8.9 8.4* 8.3*  PROT 7.1  --   --   --   --   BILITOT 0.4  --   --   --   --   ALKPHOS 65  --   --   --   --   ALT 15  --   --   --   --   AST 30  --   --   --   --   GLUCOSE 221*  < > 210* 286* 168*  < > = values in this interval not displayed.  Troponin (Point of Care Test) No results for input(s): TROPIPOC in the last 72 hours.  Imaging/Diagnostic Tests: Nm Myocar Multi W/spect W/wall Motion / Ef - 09/10/2016 - There was no ST segment deviation noted during stress.  - No T wave inversion was noted during stress.  - Defect 1: There is a medium defect of moderate severity.  - This is a high risk study.  Nuclear stress EF: 26%.  Medium size, moderate intensity fixed septal bundle branch/PVC-related artifact.  - No reversible ischemia.  - LVEF 26% with global hypokinesis and septal dyskinesis.  This is a high risk study, given severely depressed LVEF.   Everrett Coombe, MD 09/11/2016, 9:29 AM PGY-1, [place] [facility] Intern pager: [phone], text pages welcome

## 2016-09-11 NOTE — Progress Notes (Signed)
  Subjective: Some ruq pain, hungry  Objective: Vital signs in last 24 hours: Temp:  [97.7 F (36.5 C)-98.7 F (37.1 C)] 98.7 F (37.1 C) (04/10 2102) Pulse Rate:  [62-109] 109 (04/11 0554) Resp:  [20] 20 (04/11 0554) BP: (112-116)/(57-78) 114/62 (04/11 0554) SpO2:  [95 %-100 %] 95 % (04/11 0759) Weight:  [91.9 kg (202 lb 9.6 oz)] 91.9 kg (202 lb 9.6 oz) (04/11 0554) Last BM Date: 09/09/16  Intake/Output from previous day: 04/10 0701 - 04/11 0700 In: 480 [P.O.:480] Out: 1203 [Urine:1200; Stool:3] Intake/Output this shift: Total I/O In: -  Out: 350 [Urine:350]  GI: mild ruq tenderness  Lab Results:   Recent Labs  09/10/16 1147 09/11/16 0319  WBC 14.0* 13.2*  HGB 10.7* 9.7*  HCT 33.5* 30.3*  PLT 338 316   BMET  Recent Labs  09/10/16 1147 09/11/16 0319  NA 133* 134*  K 3.8 3.6  CL 89* 93*  CO2 30 29  GLUCOSE 286* 168*  BUN 53* 58*  CREATININE 2.00* 2.07*  CALCIUM 8.4* 8.3*   PT/INR  Recent Labs  09/10/16 1147 09/11/16 0319  LABPROT 30.0* 30.2*  INR 2.79 2.81   ABG No results for input(s): PHART, HCO3 in the last 72 hours.  Invalid input(s): PCO2, PO2  Studies/Results: Nm Myocar Multi W/spect W/wall Motion / Ef  Result Date: 09/10/2016  There was no ST segment deviation noted during stress.  No T wave inversion was noted during stress.  Defect 1: There is a medium defect of moderate severity.  This is a high risk study.  Nuclear stress EF: 26%.  Medium size, moderate intensity fixed septal bundle branch/PVC-related artifact. No reversible ischemia. LVEF 26% with global hypokinesis and septal dyskinesis. This is a high risk study, given severely depressed LVEF.    Anti-infectives: Anti-infectives    None      Assessment/Plan: Biliary colic  -discussed again recommendation for lap chole which she is agreeable to.  She is at moderate to high risk for both cv and pulmonary complications but I think surgery now due to inability to  eat/pain as well as reducing future episodes and other complications such as choledocholithiasis and pancreatitis is the best option -inr needs to be less than 1.5 for surgery, would recommend at least vitamin k to reverse -will make npo after mn, check inr in am when less than 1.5 will take to or.   [facility] 09/11/2016

## 2016-09-11 NOTE — Progress Notes (Addendum)
Progress Note  Patient Name: Denise Freeman [facility] Date of Encounter: 09/11/2016  Primary Cardiologist: Dr Debara Pickett  Subjective   Feels ok today.  SOB is at baseline and has not had any CP  Inpatient Medications    Scheduled Meds: . atorvastatin  40 mg Oral q1800  . budesonide (PULMICORT) nebulizer solution  0.5 mg Nebulization BID  . escitalopram  10 mg Oral QHS  . fenofibrate  54 mg Oral Daily  . ferrous sulfate  325 mg Oral QHS  . fluticasone  2 spray Each Nare Daily  . furosemide  40 mg Oral BID  . insulin aspart  0-9 Units Subcutaneous TID WC  . insulin glargine  28 Units Subcutaneous Q2200  . ipratropium  0.5 mg Nebulization BID  . levalbuterol  0.63 mg Nebulization BID  . loratadine  10 mg Oral QHS  . mouth rinse  15 mL Mouth Rinse BID  . metoprolol succinate  25 mg Oral BID  . mirtazapine  15 mg Oral QHS  . modafinil  200 mg Oral Daily  . montelukast  10 mg Oral QHS  . pantoprazole  20 mg Oral BID AC  . primidone  50 mg Oral QHS  . sodium chloride flush  3 mL Intravenous Q12H   Continuous Infusions:  PRN Meds: sodium chloride, levalbuterol, ondansetron (ZOFRAN) IV, oxyCODONE-acetaminophen, sodium chloride flush   Vital Signs    Vitals:   09/10/16 1400 09/10/16 2102 09/11/16 0554 09/11/16 0759  BP: (!) 112/57 116/78 114/62   Pulse: 62 74 (!) 109   Resp:   20   Temp: 97.7 F (36.5 C) 98.7 F (37.1 C)    TempSrc:  Oral Oral   SpO2: 100% 99% 99% 95%  Weight:   202 lb 9.6 oz (91.9 kg)   Height:        Intake/Output Summary (Last 24 hours) at 09/11/16 0903 Last data filed at 09/11/16 5027  Gross per 24 hour  Intake              480 ml  Output             1553 ml  Net            -1073 ml   Filed Weights   09/09/16 0502 09/10/16 0353 09/11/16 0554  Weight: 196 lb 3.2 oz (89 kg) 200 lb 4.8 oz (90.9 kg) 202 lb 9.6 oz (91.9 kg)    Telemetry    Atrial Fibrillation with CVR- Personally Reviewed   Physical Exam   GEN: well developed, well  nourished Neck: benign with no bruits or JVD Cardiac: irregularly irregular with no M/R/G Respiratory: CTA with decreased BS GI: soft, NT, ND with active BS MS: trivial edema Neuro:  A&O x 3 Psych: normal affect  Labs    Chemistry  Recent Labs Lab 09/06/16 1527  09/09/16 0512 09/10/16 1147 09/11/16 0319  NA 136  < > 134* 133* 134*  K 3.5  < > 3.5 3.8 3.6  CL 87*  < > 89* 89* 93*  CO2 32  < > 31 30 29   GLUCOSE 221*  < > 210* 286* 168*  BUN 29*  < > 37* 53* 58*  CREATININE 1.27*  < > 1.58* 2.00* 2.07*  CALCIUM 9.0  < > 8.9 8.4* 8.3*  PROT 7.1  --   --   --   --   ALBUMIN 4.1  --   --   --   --   AST 30  --   --   --   --  ALT 15  --   --   --   --   ALKPHOS 65  --   --   --   --   BILITOT 0.4  --   --   --   --   GFRNONAA 43*  < > 33* 25* 24*  GFRAA 50*  < > 39* 29* 28*  ANIONGAP 17*  < > 14 14 12   < > = values in this interval not displayed.   Hematology  Recent Labs Lab 09/09/16 0512 09/10/16 1147 09/11/16 0319  WBC 12.7* 14.0* 13.2*  RBC 3.83* 3.84* 3.50*  HGB 10.8* 10.7* 9.7*  HCT 33.4* 33.5* 30.3*  MCV 87.2 87.2 86.6  MCH 28.2 27.9 27.7  MCHC 32.3 31.9 32.0  RDW 12.5 12.4 12.5  PLT 287 338 316    Cardiac Enzymes  Recent Labs Lab 09/06/16 2219 09/07/16 0408 09/07/16 0707  TROPONINI 0.05* 0.05* 0.04*     Recent Labs Lab 09/06/16 1842  TROPIPOC 0.05     BNP  Recent Labs Lab 09/06/16 1756  BNP 680.4*       Radiology    Nm Myocar Multi W/spect W/wall Motion / Ef  Result Date: 09/10/2016  There was no ST segment deviation noted during stress.  No T wave inversion was noted during stress.  Defect 1: There is a medium defect of moderate severity.  This is a high risk study.  Nuclear stress EF: 26%.  Medium size, moderate intensity fixed septal bundle branch/PVC-related artifact. No reversible ischemia. LVEF 26% with global hypokinesis and septal dyskinesis. This is a high risk study, given severely depressed LVEF.    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 Cath 10/14 with normal coronaries. Echo this admission shows EF 30-35 (worse compared to previous) with anteroseptal and apical hypokinesis; RAE and RVE.  Assessment & Plan    1 Acute on chronic combined systolic/diastolic congestive heart failure  - her SOB is likely multifactorial from COPD as well as CHF.   - She does not appear to be markedly volume overloaded on examination and BNP was mildly elevated at 680 (chronically in the 200's in the past) on admission and slightly improved with diuresis.   - she put out 1.8L yesterday and is 4.9L net neg - weight has been stable over the past 2 days (weight at last OV 197lbs) weight on admit was 196>193>196>200>202>202. - creatinine bumped to 2.07 yesterday and now down to 1.61 with decreased Lasix dose. Creatinine was 1.12 in January. - Continue on Lasix 40mg  IV BID today.  2 Permanent atrial fibrillation  - HR well controlled - Difficult situation with use of BB in setting of severe lung disease but her LVF is reduced and CCB may aggravate this.  She was started on Toprol XL 25mg  BID (B1 selective) and stopped Cardizem and seems to be tolerating BB well.  Would like to avoid use of Amio for rate control given her underlying lung disease. - CHADSvasc 6. Coumadin on hold for surgery.  INR today is 1.83 and getting another dose of Vit K today.   3 Severe COPD on home O2 - Continue oxygen per TRH. - This is likely contributing to her dyspnea. Needs aggressive pulmonary toilet. Management per primary care.  4 Acute on chronic stage III kidney disease - Creatinine improved to  1.61 with decreased dose of lasix - follow renal function closely  5 hyperlipidemia - LDL 94 today - continue statin  6 chest pain -  troponin minimally elevated with flat trend.  This does not appear to be consistent with acute coronary syndrome. Previous catheterization revealed normal coronary arteries.  ? If CP related to underlying GB disease.  - Nuclear stress test reviewed: There was no ST segment deviation noted during stress. No T wave inversion was noted during stress. There was a medium defect of moderate severity c/w septal BBB/PVC artifact with no ischemia.  ER calculated at 26% bu 30-35% on echo.  Read as high risk only due to LV dysfunction.  As there is no evidence of ischemia and trop was minimally elevated with flat trend and normal coronary arteries on prior cath in 2014, recommend medical management.  There worsening LVF is likely related to tachycardia induced DCM and hopefully will improve with rate control.    - she is at moderate risk for undergoing lap chole given her LV dysfunction.  Need to be cautious with fluid overload.   7 Dilated Cardiomyopathy-LV function is nearly reduced compared to previous. Etiology unclear. Previous catheterization October 2014 showed normal coronary arteries. This is likely  tachycardia mediated from atrial fibrillation with RVR. She has severe lung disease and would not be a candidate for coronary artery bypass and graft.   8 Abdominal pain - gallstones with normal HIDA - felt to be poor candidate for surgical intervention per surgery but she does not have cholecystitis so perc drain will be of no benefit.  Needs lap chole which is planned for tomorrow.  Signed, Fransico Him, MD  09/11/2016, 9:03 AM

## 2016-09-12 DIAGNOSIS — J42 Unspecified chronic bronchitis: Secondary | ICD-10-CM

## 2016-09-12 DIAGNOSIS — I5023 Acute on chronic systolic (congestive) heart failure: Secondary | ICD-10-CM

## 2016-09-12 LAB — BASIC METABOLIC PANEL
Anion gap: 13 (ref 5–15)
BUN: 54 mg/dL — AB (ref 6–20)
CHLORIDE: 97 mmol/L — AB (ref 101–111)
CO2: 27 mmol/L (ref 22–32)
CREATININE: 1.61 mg/dL — AB (ref 0.44–1.00)
Calcium: 8.2 mg/dL — ABNORMAL LOW (ref 8.9–10.3)
GFR calc Af Amer: 38 mL/min — ABNORMAL LOW (ref >60)
GFR calc non Af Amer: 33 mL/min — ABNORMAL LOW (ref >60)
Glucose, Bld: 111 mg/dL — ABNORMAL HIGH (ref 65–99)
POTASSIUM: 3.7 mmol/L (ref 3.5–5.1)
Sodium: 137 mmol/L (ref 135–145)

## 2016-09-12 LAB — CBC
HEMATOCRIT: 33.6 % — AB (ref 36.0–46.0)
Hemoglobin: 10.6 g/dL — ABNORMAL LOW (ref 12.0–15.0)
MCH: 27.6 pg (ref 26.0–34.0)
MCHC: 31.5 g/dL (ref 30.0–36.0)
MCV: 87.5 fL (ref 78.0–100.0)
Platelets: 336 10*3/uL (ref 150–400)
RBC: 3.84 MIL/uL — ABNORMAL LOW (ref 3.87–5.11)
RDW: 12.9 % (ref 11.5–15.5)
WBC: 12.8 10*3/uL — AB (ref 4.0–10.5)

## 2016-09-12 LAB — SURGICAL PCR SCREEN
MRSA, PCR: NEGATIVE
Staphylococcus aureus: NEGATIVE

## 2016-09-12 LAB — GLUCOSE, CAPILLARY
GLUCOSE-CAPILLARY: 110 mg/dL — AB (ref 65–99)
GLUCOSE-CAPILLARY: 106 mg/dL — AB (ref 65–99)
GLUCOSE-CAPILLARY: 184 mg/dL — AB (ref 65–99)
Glucose-Capillary: 107 mg/dL — ABNORMAL HIGH (ref 65–99)
Glucose-Capillary: 146 mg/dL — ABNORMAL HIGH (ref 65–99)
Glucose-Capillary: 161 mg/dL — ABNORMAL HIGH (ref 65–99)

## 2016-09-12 LAB — PROTIME-INR
INR: 1.83
Prothrombin Time: 21.4 seconds — ABNORMAL HIGH (ref 11.4–15.2)

## 2016-09-12 MED ORDER — PHYTONADIONE 5 MG PO TABS
2.5000 mg | ORAL_TABLET | Freq: Once | ORAL | Status: AC
  Administered 2016-09-12: 2.5 mg via ORAL
  Filled 2016-09-12: qty 1

## 2016-09-12 NOTE — Progress Notes (Signed)
Patient walked with mobility tech and stated she felt her sugar was low and she needed some peanut butter crackers to eat.  CBG checked, BS 106.  Patient informed that she is on a full liquid diet and peanut butter crackers are not full liquid.  Patient did eat some sugar free ice cream.  Patient states she feels weak from lack of food although she has been receiving full liquid trays.

## 2016-09-12 NOTE — Progress Notes (Signed)
Patient ID: Denise Freeman, female   DOB: 24-Jul-1950, 66 y.o.   MRN: [id number] Continued ruq pain, inr 1.83.  Will let have fulls today and npo after mn. inr should be ok for surgery tomorrow. I do not consider this elective surgery. My preference would be to not operate no her but due to continued symptoms and risk of other gb complications we have no real good option.

## 2016-09-12 NOTE — Progress Notes (Signed)
Family Medicine Teaching Service Daily Progress Note Intern Pager: [phone]  Patient name: Denise Freeman [facility] Medical record number: [id number] Date of birth: [birth year] Age: 66 y.o. Gender: female  Primary Care Provider: Darci Needle, MD Consultants: Cardiology, General surgery Code Status: FULL  Pt Overview and Major Events to Date:  04/06: Admit for abdominal pain and SOB 04/07: HIDA negative 04/11: Risks for laparoscopic cholecystectomy discussed, patient wants to proceed with surgery 04/12: INR 1.83, plan to proceed with lap chole 4/13  Assessment and Plan: Denise Freeman is a 66 y.o. female presenting with RUQ pain and shortness of breath. 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   #RUQ Abdominal Pain: VS and afebrile. Pain likely from symptomatic cholelithiasis, however patient is poor surgical candidate. Would likely be a difficult extubation per pulm. Has worsening LV function per cardiology. Strongly consider medical management, surgery would be high risk for this patient. Patient wants to proceed with surgery. --Surgery recs: proceed with laparoscopic cholecystectomy, get INR <1.5 --Perc drain not an option per surgery --Hold Coumadin 4/11, check INR daily until <2, then start heparin   #Acute on Chronic HFrEF: UOP -1.8 over last 24h. Appears euvolemic. Nuc stress test high risk study for worsened LV dysfunction, EF 26% (30-35% on echo), however no ischemia, flat trop trend. --Decreased to Lasix 40 mg PO BID given worsening renal function --Continue Toprol-XL 25 mg BID --Cardiology consulted, appreciate recommendations: continue medical management as LVF may be related to tachycardia and induced DC Mand hopefully will improve with rate control  #Atrial  Fibrillation with mild RVR: Chronic. Rate controlled. On BB. --Holding coumadin as noted above --Continue Toprol-XL 25 mg BID --Per cards recs try to avoid use of amio given underlying lung disease  #Insulin-dependent diabetes mellitus: Chronic. Stable. CBGs 100-140. Home: Lantus 40 units at bedtime, SSI, Metformin.  --Holding Metformin --Increase Lantus 28 U QHS --CBGs TID, QHS, sensitive SSI    #Acute on Chronic kidney disease stage III: Cr gradually trending up, 2.07 from 2.01 yesterday. Baseline appears to be 1.1-1.3 --Avoid nephrotoxic agents  #COPD  Asthma: Possible acute on chronic exacerbation. Stable. On 3L [place] at home.  --Singulair with PRN duoneb --Atrovent, Levalbuterol  #Hyperlipidemia: Chornic.  --Fenofibrate  54 mg QD  #Obstructive sleep apnea: Chronic. On BiPAP. --BiPAP QHS --Modafinil 200 mg QD  #Vitamin D deficiency: Chronic. On supplementation. --Continue Vit D   #Depression: No active depression since admission. --Remeron 15 mg QHS  #Chronic Pain: Continue home Percocet regimen  #Hypokalemia: Acute. Resolved.  FEN/GI: NPO for lap chole Prophylaxis: Lovenox SQ  Disposition: Plan for laparoscopic cholecystectomy. INR not yet <1.5. Plan for surgery 4/13.  Subjective:  She overall says she feels okay today. Continues to have RUQ pain without nausea or vomiting. Denies chest pain or shortness of breath. States she wants to proceed with surgery still.  Objective: Temp:  [97.3 F (36.3 C)-98.4 F (36.9 C)] 97.4 F (36.3 C) (04/12 0528) Pulse Rate:  [52-87] 52 (04/12 0528) Resp:  [18-20] 18 (04/12 0528) BP: (103-116)/(51-67) 109/51 (04/12 0528) SpO2:  [96 %-100 %] 98 % (04/12 0528) Weight:  [202 lb 4.8 oz (91.8 kg)] 202 lb 4.8 oz (91.8 kg) (04/12 0528) Physical Exam: General: well nourished, well developed, in no acute distress with non-toxic appearance HEENT:  normocephalic, atraumatic, moist mucous membranes CV: regular rate and rhythm  without murmurs, rubs, or gallops Lungs: clear to auscultation bilaterally with normal work of breathing Abdomen: soft, non-tender, no masses or organomegaly palpable, normoactive bowel sounds Skin: warm, dry, no rashes or lesions, cap refill < 2 seconds Extremities: warm and well perfused, normal tone, trace edema bilateral lower extremities  Laboratory:  Recent Labs Lab 09/10/16 1147 09/11/16 0319 09/12/16 0306  WBC 14.0* 13.2* 12.8*  HGB 10.7* 9.7* 10.6*  HCT 33.5* 30.3* 33.6*  PLT 338 316 336    Recent Labs Lab 09/06/16 1527  09/10/16 1147 09/11/16 0319 09/12/16 0306  NA 136  < > 133* 134* 137  K 3.5  < > 3.8 3.6 3.7  CL 87*  < > 89* 93* 97*  CO2 32  < > 30 29 27   BUN 29*  < > 53* 58* 54*  CREATININE 1.27*  < > 2.00* 2.07* 1.61*  CALCIUM 9.0  < > 8.4* 8.3* 8.2*  PROT 7.1  --   --   --   --   BILITOT 0.4  --   --   --   --   ALKPHOS 65  --   --   --   --   ALT 15  --   --   --   --   AST 30  --   --   --   --   GLUCOSE 221*  < > 286* 168* 111*  < > = values in this interval not displayed.  Urinalysis    Component Value Date/Time   COLORURINE YELLOW 09/07/2016 1413   APPEARANCEUR HAZY (A) 09/07/2016 1413   LABSPEC 1.008 09/07/2016 1413   PHURINE 7.0 09/07/2016 1413   GLUCOSEU NEGATIVE 09/07/2016 1413   HGBUR NEGATIVE 09/07/2016 1413   BILIRUBINUR NEGATIVE 09/07/2016 1413   KETONESUR NEGATIVE 09/07/2016 1413   PROTEINUR 30 (A) 09/07/2016 1413   UROBILINOGEN 1.0 02/20/2015 1855   NITRITE NEGATIVE 09/07/2016 1413   LEUKOCYTESUR MODERATE (A) 09/07/2016 1413   Lipid Panel     Component Value Date/Time   CHOL 148 09/10/2016 1147   TRIG 150 (H) 09/10/2016 1147   HDL 24 (L) 09/10/2016 1147   CHOLHDL 6.2 09/10/2016 1147   VLDL 30 09/10/2016 1147   LDLCALC 94 09/10/2016 1147   LDLDIRECT 153.0 04/19/2016 0932   Lipase: 13 (WNL)  BNP: 680 > 644 (baseline 200s) Troponin: 0.03 > 0.05 > 0.05 > 0.04 INR: 2.65 > 2.42 > 2.33 > 2.79 > 1.83 Magnesium: 1.8  (WNL) UCx: <10,000 colonies, insignificant growth  Imaging/Diagnostic Tests: NM Myocar Multi W/Spect W/Wall Motion / EF (09/10/2016) There was no ST segment deviation noted during stress. No T wave inversion was noted during stress. Defect 1: There is a medium defect of moderate severity. This is a high risk study. Nuclear stress EF: 26%.   Medium size, moderate intensity fixed septal bundle branch/PVC-related artifact. No reversible ischemia. LVEF 26% with global hypokinesis and septal dyskinesis. This is a high risk study, given severely depressed LVEF.  CT RENAL STONE STUDY (09/07/2016) FINDINGS: Lower chest: Lung bases are within normal.  Mild cardiomegaly. Hepatobiliary: Liver and biliary tree are within normal. There is mild cholelithiasis present. Subtle nonspecific hazy attenuation within the fat adjacent over the porta hepatis. Pancreas: Normal. Spleen: Normal. Adrenals/Urinary Tract: Adrenal glands are normal. Kidneys are normal in size without hydronephrosis or nephrolithiasis. Ureters and bladder are normal. Stomach/Bowel: Stomach and small bowel are normal. Prior appendectomy. Colon is  within normal. Vascular/Lymphatic: Minimal calcified plaque over the abdominal aorta and iliac arteries. Remaining vascular structures are within normal. No adenopathy. Reproductive: Within normal. Other: No free fluid.  No free peritoneal air. Musculoskeletal: Mild degenerate change of the spine and hips.  IMPRESSION: Cholelithiasis. Subtle nonspecific hazy attenuation of the fat adjacent the porta hepatis which may be seen with cholecystitis or pancreatitis. Minimal aortic atherosclerosis.  NM Hepatobiliary Liver Func (09/07/2016) FINDINGS: Prompt uptake and biliary excretion of activity by the liver is seen. Gallbladder activity is visualized, consistent with patency of cystic duct. Biliary activity passes into small bowel, consistent with patent common bile duct.  IMPRESSION: Normal  exam.  No evidence of cholecystitis or bile duct obstruction.  US Abdomen Limited RUQ (09/06/2016) FINDINGS: Gallbladder: Multiple shadowing stones measuring up to 8.5 mm. Normal wall thickness. Negative sonographic Murphy's. Common bile duct: Diameter: Normal at 4.9 mm Liver: Coarse echogenic liver consistent with fatty infiltration. No ascites.  IMPRESSION: 1. Cholelithiasis without other sonographic features to suggest acute cholecystitis. No biliary dilatation. 2. Fatty infiltration of the liver  DG Chest 2 View (09/06/2016) FINDINGS: The heart is enlarged but stable. The mediastinal and hilar contours are within normal limits and unchanged. Mild vascular congestion and chronic bronchitic changes but no pulmonary edema, pleural effusions or focal infiltrates.  IMPRESSION: Cardiac enlargement with mild vascular congestion and chronic bronchitic changes but no infiltrates, edema or effusions.  [place] Bing, DO 09/12/2016, 8:14 AM PGY-1, [place] Intern pager: [phone], text pages welcome

## 2016-09-12 NOTE — Progress Notes (Signed)
Pt is informed of the current diet order (full liquid).

## 2016-09-13 ENCOUNTER — Encounter (HOSPITAL_COMMUNITY): Payer: Self-pay | Admitting: Surgery

## 2016-09-13 ENCOUNTER — Encounter (HOSPITAL_COMMUNITY)
Admission: EM | Disposition: A | Discharge status: Home / Self Care | Payer: Self-pay | Source: Home / Self Care | Attending: Family Medicine

## 2016-09-13 ENCOUNTER — Inpatient Hospital Stay (HOSPITAL_COMMUNITY): Payer: Medicare Other

## 2016-09-13 ENCOUNTER — Inpatient Hospital Stay (HOSPITAL_COMMUNITY): Payer: Medicare Other | Admitting: Anesthesiology

## 2016-09-13 HISTORY — PX: CHOLECYSTECTOMY: SHX55

## 2016-09-13 LAB — COMPREHENSIVE METABOLIC PANEL
ALK PHOS: 81 U/L (ref 38–126)
ALT: 223 U/L — AB (ref 14–54)
AST: 316 U/L — ABNORMAL HIGH (ref 15–41)
Albumin: 3.3 g/dL — ABNORMAL LOW (ref 3.5–5.0)
Anion gap: 11 (ref 5–15)
BUN: 40 mg/dL — ABNORMAL HIGH (ref 6–20)
CALCIUM: 8.2 mg/dL — AB (ref 8.9–10.3)
CHLORIDE: 99 mmol/L — AB (ref 101–111)
CO2: 26 mmol/L (ref 22–32)
CREATININE: 1.35 mg/dL — AB (ref 0.44–1.00)
GFR, EST AFRICAN AMERICAN: 47 mL/min — AB (ref >60)
GFR, EST NON AFRICAN AMERICAN: 40 mL/min — AB (ref >60)
Glucose, Bld: 147 mg/dL — ABNORMAL HIGH (ref 65–99)
Potassium: 4.4 mmol/L (ref 3.5–5.1)
Sodium: 136 mmol/L (ref 135–145)
Total Bilirubin: 0.9 mg/dL (ref 0.3–1.2)
Total Protein: 6.1 g/dL — ABNORMAL LOW (ref 6.5–8.1)

## 2016-09-13 LAB — GLUCOSE, CAPILLARY
GLUCOSE-CAPILLARY: 104 mg/dL — AB (ref 65–99)
GLUCOSE-CAPILLARY: 102 mg/dL — AB (ref 65–99)
GLUCOSE-CAPILLARY: 125 mg/dL — AB (ref 65–99)
GLUCOSE-CAPILLARY: 102 mg/dL — AB (ref 65–99)
Glucose-Capillary: 114 mg/dL — ABNORMAL HIGH (ref 65–99)
Glucose-Capillary: 180 mg/dL — ABNORMAL HIGH (ref 65–99)
Glucose-Capillary: 96 mg/dL (ref 65–99)

## 2016-09-13 LAB — CBC
HEMATOCRIT: 32.9 % — AB (ref 36.0–46.0)
HEMOGLOBIN: 10.1 g/dL — AB (ref 12.0–15.0)
MCH: 27.2 pg (ref 26.0–34.0)
MCHC: 30.7 g/dL (ref 30.0–36.0)
MCV: 88.7 fL (ref 78.0–100.0)
Platelets: 308 10*3/uL (ref 150–400)
RBC: 3.71 MIL/uL — AB (ref 3.87–5.11)
RDW: 13.1 % (ref 11.5–15.5)
WBC: 13.1 10*3/uL — AB (ref 4.0–10.5)

## 2016-09-13 LAB — PROTIME-INR
INR: 1.22
PROTHROMBIN TIME: 15.5 s — AB (ref 11.4–15.2)

## 2016-09-13 SURGERY — LAPAROSCOPIC CHOLECYSTECTOMY
Anesthesia: General | Site: Abdomen

## 2016-09-13 MED ORDER — DEXTROSE 5 % IV SOLN
INTRAVENOUS | Status: DC | PRN
  Administered 2016-09-13: 25 ug/min via INTRAVENOUS

## 2016-09-13 MED ORDER — INSULIN GLARGINE 100 UNIT/ML ~~LOC~~ SOLN
12.0000 [IU] | Freq: Every day | SUBCUTANEOUS | Status: DC
  Filled 2016-09-13: qty 0.12

## 2016-09-13 MED ORDER — FENTANYL CITRATE (PF) 250 MCG/5ML IJ SOLN
INTRAMUSCULAR | Status: AC
  Filled 2016-09-13: qty 5

## 2016-09-13 MED ORDER — ONDANSETRON HCL 4 MG/2ML IJ SOLN: 4.0000 mg | Freq: Once | INTRAMUSCULAR | Status: DC | PRN

## 2016-09-13 MED ORDER — ONDANSETRON HCL 4 MG/2ML IJ SOLN
INTRAMUSCULAR | Status: AC
  Filled 2016-09-13: qty 2

## 2016-09-13 MED ORDER — OXYCODONE HCL 5 MG PO TABS: 5.0000 mg | ORAL_TABLET | Freq: Four times a day (QID) | ORAL | Status: DC | PRN

## 2016-09-13 MED ORDER — SUGAMMADEX SODIUM 500 MG/5ML IV SOLN
INTRAVENOUS | Status: AC
Start: 2016-09-13 — End: 2016-09-13
  Filled 2016-09-13: qty 5

## 2016-09-13 MED ORDER — OXYCODONE HCL 5 MG PO TABS: 5.0000 mg | ORAL_TABLET | Freq: Once | ORAL | Status: DC | PRN

## 2016-09-13 MED ORDER — MIDAZOLAM HCL 5 MG/5ML IJ SOLN
INTRAMUSCULAR | Status: DC | PRN
  Administered 2016-09-13: 1 mg via INTRAVENOUS

## 2016-09-13 MED ORDER — SUGAMMADEX SODIUM 500 MG/5ML IV SOLN
INTRAVENOUS | Status: DC | PRN
  Administered 2016-09-13: 400 mg via INTRAVENOUS

## 2016-09-13 MED ORDER — FENTANYL CITRATE (PF) 100 MCG/2ML IJ SOLN
INTRAMUSCULAR | Status: AC
  Filled 2016-09-13: qty 2

## 2016-09-13 MED ORDER — SODIUM CHLORIDE 0.9 % IR SOLN
Status: DC | PRN
  Administered 2016-09-13: 1

## 2016-09-13 MED ORDER — BUPIVACAINE HCL (PF) 0.25 % IJ SOLN
INTRAMUSCULAR | Status: AC
  Filled 2016-09-13: qty 30

## 2016-09-13 MED ORDER — LACTATED RINGERS IV SOLN
INTRAVENOUS | Status: DC
  Administered 2016-09-13: 11:00:00 via INTRAVENOUS

## 2016-09-13 MED ORDER — CIPROFLOXACIN IN D5W 400 MG/200ML IV SOLN
400.0000 mg | Freq: Once | INTRAVENOUS | Status: AC
  Administered 2016-09-13: 400 mg via INTRAVENOUS
  Filled 2016-09-13: qty 200

## 2016-09-13 MED ORDER — MIDAZOLAM HCL 2 MG/2ML IJ SOLN
INTRAMUSCULAR | Status: AC
  Filled 2016-09-13: qty 2

## 2016-09-13 MED ORDER — FENTANYL CITRATE (PF) 100 MCG/2ML IJ SOLN
INTRAMUSCULAR | Status: DC | PRN
  Administered 2016-09-13 (×2): 50 ug via INTRAVENOUS
  Administered 2016-09-13: 100 ug via INTRAVENOUS

## 2016-09-13 MED ORDER — LIDOCAINE 2% (20 MG/ML) 5 ML SYRINGE
INTRAMUSCULAR | Status: AC
  Filled 2016-09-13: qty 5

## 2016-09-13 MED ORDER — SUGAMMADEX SODIUM 500 MG/5ML IV SOLN
INTRAVENOUS | Status: AC
  Filled 2016-09-13: qty 5

## 2016-09-13 MED ORDER — PROPOFOL 10 MG/ML IV BOLUS
INTRAVENOUS | Status: AC
  Filled 2016-09-13: qty 20

## 2016-09-13 MED ORDER — 0.9 % SODIUM CHLORIDE (POUR BTL) OPTIME
TOPICAL | Status: DC | PRN
  Administered 2016-09-13: 1000 mL

## 2016-09-13 MED ORDER — ROCURONIUM BROMIDE 100 MG/10ML IV SOLN
INTRAVENOUS | Status: DC | PRN
  Administered 2016-09-13: 50 mg via INTRAVENOUS

## 2016-09-13 MED ORDER — MORPHINE SULFATE (PF) 2 MG/ML IV SOLN
1.0000 mg | INTRAVENOUS | Status: DC | PRN
Start: 2016-09-13 — End: 2016-09-14
  Filled 2016-09-13: qty 1

## 2016-09-13 MED ORDER — PHENYLEPHRINE HCL 10 MG/ML IJ SOLN
INTRAMUSCULAR | Status: DC | PRN
  Administered 2016-09-13 (×2): 80 ug via INTRAVENOUS

## 2016-09-13 MED ORDER — PROPOFOL 10 MG/ML IV BOLUS
INTRAVENOUS | Status: DC | PRN
  Administered 2016-09-13: 120 mg via INTRAVENOUS

## 2016-09-13 MED ORDER — HEMOSTATIC AGENTS (NO CHARGE) OPTIME
TOPICAL | Status: DC | PRN
  Administered 2016-09-13: 1 via TOPICAL

## 2016-09-13 MED ORDER — OXYCODONE HCL 5 MG/5ML PO SOLN: 5.0000 mg | Freq: Once | ORAL | Status: DC | PRN

## 2016-09-13 MED ORDER — BUPIVACAINE-EPINEPHRINE 0.25% -1:200000 IJ SOLN
INTRAMUSCULAR | Status: DC | PRN
Start: 2016-09-13 — End: 2016-09-13
  Administered 2016-09-13: 13 mL

## 2016-09-13 MED ORDER — FENTANYL CITRATE (PF) 100 MCG/2ML IJ SOLN
25.0000 ug | INTRAMUSCULAR | Status: DC | PRN
  Administered 2016-09-13 (×3): 25 ug via INTRAVENOUS

## 2016-09-13 MED ORDER — LIDOCAINE HCL (CARDIAC) 20 MG/ML IV SOLN
INTRAVENOUS | Status: DC | PRN
  Administered 2016-09-13: 40 mg via INTRAVENOUS

## 2016-09-13 MED ORDER — ONDANSETRON HCL 4 MG/2ML IJ SOLN
INTRAMUSCULAR | Status: DC | PRN
  Administered 2016-09-13: 4 mg via INTRAVENOUS

## 2016-09-13 MED ORDER — SUCCINYLCHOLINE CHLORIDE 20 MG/ML IJ SOLN
INTRAMUSCULAR | Status: DC | PRN
  Administered 2016-09-13: 120 mg via INTRAVENOUS

## 2016-09-13 SURGICAL SUPPLY — 40 items
APPLIER CLIP 5 13 M/L LIGAMAX5 (MISCELLANEOUS) ×2
BLADE CLIPPER SURG (BLADE) IMPLANT
CANISTER SUCT 3000ML PPV (MISCELLANEOUS) ×2 IMPLANT
CHLORAPREP W/TINT 26ML (MISCELLANEOUS) ×2 IMPLANT
CLIP APPLIE 5 13 M/L LIGAMAX5 (MISCELLANEOUS) ×1 IMPLANT
COVER SURGICAL LIGHT HANDLE (MISCELLANEOUS) ×2 IMPLANT
DERMABOND ADVANCED (GAUZE/BANDAGES/DRESSINGS) ×1
DERMABOND ADVANCED .7 DNX12 (GAUZE/BANDAGES/DRESSINGS) ×1 IMPLANT
DEVICE TROCAR PUNCTURE CLOSURE (ENDOMECHANICALS) ×2 IMPLANT
ELECT REM PT RETURN 9FT ADLT (ELECTROSURGICAL) ×2
ELECTRODE REM PT RTRN 9FT ADLT (ELECTROSURGICAL) ×1 IMPLANT
GLOVE BIO SURGEON STRL SZ7 (GLOVE) ×2 IMPLANT
GLOVE BIOGEL PI IND STRL 7.0 (GLOVE) ×1 IMPLANT
GLOVE BIOGEL PI IND STRL 7.5 (GLOVE) ×2 IMPLANT
GLOVE BIOGEL PI INDICATOR 7.0 (GLOVE) ×1
GLOVE BIOGEL PI INDICATOR 7.5 (GLOVE) ×2
GLOVE SURG SS PI 7.0 STRL IVOR (GLOVE) ×2 IMPLANT
GOWN STRL REUS W/ TWL LRG LVL3 (GOWN DISPOSABLE) ×3 IMPLANT
GOWN STRL REUS W/TWL LRG LVL3 (GOWN DISPOSABLE) ×3
HEMOSTAT SNOW SURGICEL 2X4 (HEMOSTASIS) ×2 IMPLANT
KIT BASIN OR (CUSTOM PROCEDURE TRAY) ×2 IMPLANT
KIT ROOM TURNOVER OR (KITS) ×2 IMPLANT
NS IRRIG 1000ML POUR BTL (IV SOLUTION) ×2 IMPLANT
PAD ARMBOARD 7.5X6 YLW CONV (MISCELLANEOUS) ×2 IMPLANT
POUCH RETRIEVAL ECOSAC 10 (ENDOMECHANICALS) ×1 IMPLANT
POUCH RETRIEVAL ECOSAC 10MM (ENDOMECHANICALS) ×1
SCISSORS LAP 5X35 DISP (ENDOMECHANICALS) ×2 IMPLANT
SET IRRIG TUBING LAPAROSCOPIC (IRRIGATION / IRRIGATOR) ×2 IMPLANT
SLEEVE ENDOPATH XCEL 5M (ENDOMECHANICALS) ×10 IMPLANT
SPECIMEN JAR SMALL (MISCELLANEOUS) ×2 IMPLANT
STRIP CLOSURE SKIN 1/2X4 (GAUZE/BANDAGES/DRESSINGS) ×2 IMPLANT
SUT MNCRL AB 4-0 PS2 18 (SUTURE) ×2 IMPLANT
SUT VICRYL 0 UR6 27IN ABS (SUTURE) ×2 IMPLANT
TOWEL OR 17X24 6PK STRL BLUE (TOWEL DISPOSABLE) ×2 IMPLANT
TOWEL OR 17X26 10 PK STRL BLUE (TOWEL DISPOSABLE) ×2 IMPLANT
TRAY LAPAROSCOPIC MC (CUSTOM PROCEDURE TRAY) ×2 IMPLANT
TROCAR BLADELESS 12MM (ENDOMECHANICALS) ×2 IMPLANT
TROCAR XCEL BLUNT TIP 100MML (ENDOMECHANICALS) ×2 IMPLANT
TROCAR XCEL NON-BLD 5MMX100MML (ENDOMECHANICALS) ×2 IMPLANT
TUBING INSUFFLATION (TUBING) ×2 IMPLANT

## 2016-09-13 NOTE — Anesthesia Postprocedure Evaluation (Addendum)
Anesthesia Post Note  Patient: Denise Freeman  Procedure(s) Performed: Procedure(s) (LRB): LAPAROSCOPIC CHOLECYSTECTOMY (N/A)  Patient location during evaluation: PACU Anesthesia Type: General Level of consciousness: awake, awake and alert and oriented Pain management: pain level controlled Vital Signs Assessment: post-procedure vital signs reviewed and stable Respiratory status: spontaneous breathing, respiratory function stable, patient connected to face mask oxygen and nonlabored ventilation Cardiovascular status: blood pressure returned to baseline Anesthetic complications: no       Last Vitals:  Vitals:   09/13/16 0422 09/13/16 1305  BP: 108/75 92/80  Pulse: 60 72  Resp: 18 16  Temp: 36.4 C 36.7 C    Last Pain:  Vitals:   09/13/16 1345  TempSrc:   PainSc: 6                  Rebekkah Powless COKER

## 2016-09-13 NOTE — Progress Notes (Signed)
Family Medicine Teaching Service Daily Progress Note Intern Pager: [phone]  Patient name: Denise Freeman [facility] Medical record number: [id number] Date of birth: [birth year] Age: 66 y.o. Gender: female  Primary Care Provider: Darci Needle, MD Consultants: Cardiology, General surgery Code Status: FULL  Pt Overview and Major Events to Date:  04/06: Admit for abdominal pain and SOB 04/07: HIDA negative 04/11: Risks for laparoscopic cholecystectomy discussed, patient wants to proceed with surgery 04/12: INR 1.83, plan to proceed with lap chole 4/13 04/13: INR 1.22, to OR for lap chole  Assessment and Plan: Denise Freeman is a 66 y.o. female presenting with RUQ pain and shortness of breath. 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   #RUQ Abdominal Pain: VS and afebrile. Pain likely from symptomatic cholelithiasis, however patient is poor surgical candidate. Would likely be a difficult extubation per pulm. Has worsening LV function per cardiology. Strongly consider medical management, surgery would be high risk for this patient. Patient wants to proceed with surgery. --Surgery recs: proceed with laparoscopic cholecystectomy, since INR <1.5 --Perc drain not an option per surgery  #Acute on Chronic HFrEF: Resolved. UOP -4.9 since admission. Appears euvolemic. Nuc stress test high risk study for worsened LV dysfunction, EF 26% (30-35% on echo), however no ischemia, flat trop trend. --Decreased to Lasix 40 mg PO BID given worsening renal function --Continue Toprol-XL 25 mg BID --Cardiology consulted, appreciate recommendations: continue medical management as LVF may be related to tachycardia and induced DC Mand hopefully will improve with rate control  #Atrial Fibrillation  with mild RVR: Chronic. Rate controlled. On BB. --Holding coumadin as noted above --Continue Toprol-XL 25 mg BID --Per cards recs try to avoid use of amio given underlying lung disease  #Insulin-dependent diabetes mellitus: Chronic. Stable. CBGs 100-180. Home: Lantus 40 units at bedtime, SSI, Metformin.  --Holding Metformin --Increase Lantus 28 U QHS --CBGs TID, QHS, sensitive SSI    #Acute on Chronic kidney disease stage III: Cr gradually trending up. Baseline appears to be 1.1-1.3 --Avoid nephrotoxic agents  #COPD  Asthma: Possible acute on chronic exacerbation. Stable. On 3L [place] at home.  --Singulair with PRN duoneb --Atrovent, Levalbuterol  #Hyperlipidemia: Chornic.  --Fenofibrate  54 mg QD  #Obstructive sleep apnea: Chronic. On BiPAP. --BiPAP QHS --Modafinil 200 mg QD  #Vitamin D deficiency: Chronic. On supplementation. --Continue Vit D   #Depression: No active depression since admission. --Remeron 15 mg QHS  #Chronic Pain: Continue home Percocet regimen  #Hypokalemia: Acute. Resolved.  FEN/GI: NPO for lap chole Prophylaxis: Holding coumadin for sx  Disposition: Plan for laparoscopic cholecystectomy. INR not yet <1.5. Plan for surgery 4/13.  Subjective:  Patient says she is ready for surgery. Continues to have RUQ pain. Denies chest pain or shortness of breath.  Objective: Temp:  [97.6 F (36.4 C)-98 F (36.7 C)] 97.6 F (36.4 C) (04/13 0422) Pulse Rate:  [60-77] 60 (04/13 0422) Resp:  [14-18] 18 (04/13 0422) BP: (108-118)/(62-96) 108/75 (04/13 0422) SpO2:  [99 %-100 %] 99 % (04/13 0422) Weight:  [207 lb 4.8 oz (94 kg)] 207 lb 4.8 oz (94 kg) (04/13 0422) Physical Exam: General: well nourished, well developed, in no acute distress with non-toxic appearance HEENT: normocephalic, atraumatic, moist mucous membranes CV: regular rate and rhythm without murmurs, rubs, or gallops Lungs: clear to  auscultation bilaterally with normal work of  breathing Abdomen: soft, non-tender, no masses or organomegaly palpable, normoactive bowel sounds Skin: warm, dry, no rashes or lesions, cap refill < 2 seconds Extremities: warm and well perfused, normal tone, trace edema bilateral lower extremities  Laboratory:  Recent Labs Lab 09/10/16 1147 09/11/16 0319 09/12/16 0306  WBC 14.0* 13.2* 12.8*  HGB 10.7* 9.7* 10.6*  HCT 33.5* 30.3* 33.6*  PLT 338 316 336    Recent Labs Lab 09/06/16 1527  09/10/16 1147 09/11/16 0319 09/12/16 0306  NA 136  < > 133* 134* 137  K 3.5  < > 3.8 3.6 3.7  CL 87*  < > 89* 93* 97*  CO2 32  < > 30 29 27   BUN 29*  < > 53* 58* 54*  CREATININE 1.27*  < > 2.00* 2.07* 1.61*  CALCIUM 9.0  < > 8.4* 8.3* 8.2*  PROT 7.1  --   --   --   --   BILITOT 0.4  --   --   --   --   ALKPHOS 65  --   --   --   --   ALT 15  --   --   --   --   AST 30  --   --   --   --   GLUCOSE 221*  < > 286* 168* 111*  < > = values in this interval not displayed.  Urinalysis    Component Value Date/Time   COLORURINE YELLOW 09/07/2016 1413   APPEARANCEUR HAZY (A) 09/07/2016 1413   LABSPEC 1.008 09/07/2016 1413   PHURINE 7.0 09/07/2016 1413   GLUCOSEU NEGATIVE 09/07/2016 1413   HGBUR NEGATIVE 09/07/2016 1413   BILIRUBINUR NEGATIVE 09/07/2016 1413   KETONESUR NEGATIVE 09/07/2016 1413   PROTEINUR 30 (A) 09/07/2016 1413   UROBILINOGEN 1.0 02/20/2015 1855   NITRITE NEGATIVE 09/07/2016 1413   LEUKOCYTESUR MODERATE (A) 09/07/2016 1413   Lipid Panel     Component Value Date/Time   CHOL 148 09/10/2016 1147   TRIG 150 (H) 09/10/2016 1147   HDL 24 (L) 09/10/2016 1147   CHOLHDL 6.2 09/10/2016 1147   VLDL 30 09/10/2016 1147   LDLCALC 94 09/10/2016 1147   LDLDIRECT 153.0 04/19/2016 0932   Lipase: 13 (WNL)  BNP: 680 > 644 (baseline 200s) Troponin: 0.03 > 0.05 > 0.05 > 0.04 INR: 2.65 > 2.42 > 2.33 > 2.79 > 1.83 Magnesium: 1.8 (WNL) UCx: <10,000 colonies, insignificant growth  Imaging/Diagnostic Tests: NM Myocar Multi W/Spect  W/Wall Motion / EF (09/10/2016) There was no ST segment deviation noted during stress. No T wave inversion was noted during stress. Defect 1: There is a medium defect of moderate severity. This is a high risk study. Nuclear stress EF: 26%.   Medium size, moderate intensity fixed septal bundle branch/PVC-related artifact. No reversible ischemia. LVEF 26% with global hypokinesis and septal dyskinesis. This is a high risk study, given severely depressed LVEF.  CT RENAL STONE STUDY (09/07/2016) FINDINGS: Lower chest: Lung bases are within normal.  Mild cardiomegaly. Hepatobiliary: Liver and biliary tree are within normal. There is mild cholelithiasis present. Subtle nonspecific hazy attenuation within the fat adjacent over the porta hepatis. Pancreas: Normal. Spleen: Normal. Adrenals/Urinary Tract: Adrenal glands are normal. Kidneys are normal in size without hydronephrosis or nephrolithiasis. Ureters and bladder are normal. Stomach/Bowel: Stomach and small bowel are normal. Prior appendectomy. Colon is within normal. Vascular/Lymphatic: Minimal calcified plaque over the abdominal aorta and iliac arteries. Remaining vascular structures are within  normal. No adenopathy. Reproductive: Within normal. Other: No free fluid.  No free peritoneal air. Musculoskeletal: Mild degenerate change of the spine and hips.  IMPRESSION: Cholelithiasis. Subtle nonspecific hazy attenuation of the fat adjacent the porta hepatis which may be seen with cholecystitis or pancreatitis. Minimal aortic atherosclerosis.  NM Hepatobiliary Liver Func (09/07/2016) FINDINGS: Prompt uptake and biliary excretion of activity by the liver is seen. Gallbladder activity is visualized, consistent with patency of cystic duct. Biliary activity passes into small bowel, consistent with patent common bile duct.  IMPRESSION: Normal exam.  No evidence of cholecystitis or bile duct obstruction.  US Abdomen Limited RUQ  (09/06/2016) FINDINGS: Gallbladder: Multiple shadowing stones measuring up to 8.5 mm. Normal wall thickness. Negative sonographic Murphy's. Common bile duct: Diameter: Normal at 4.9 mm Liver: Coarse echogenic liver consistent with fatty infiltration. No ascites.  IMPRESSION: 1. Cholelithiasis without other sonographic features to suggest acute cholecystitis. No biliary dilatation. 2. Fatty infiltration of the liver  DG Chest 2 View (09/06/2016) FINDINGS: The heart is enlarged but stable. The mediastinal and hilar contours are within normal limits and unchanged. Mild vascular congestion and chronic bronchitic changes but no pulmonary edema, pleural effusions or focal infiltrates.  IMPRESSION: Cardiac enlargement with mild vascular congestion and chronic bronchitic changes but no infiltrates, edema or effusions.  [place] Bing, DO 09/13/2016, 7:18 AM PGY-1, [place] Intern pager: [phone], text pages welcome

## 2016-09-13 NOTE — Op Note (Addendum)
Preoperative diagnosis: chronic cholecystitis Postoperative diagnosis: acute cholecystitis Procedure: Laparoscopic cholecystectomy Surgeon: Dr. Serita Grammes Anesthesia: Gen. Specimens: gb to pathology Estimated blood loss: minimal Complications: None Drains: none Sponge count was correct at completion Disposition to recovery stable  Indications: This is a very ill 31 yof with symptoms consistent with cholecystitis.  She is at high risk medically.   She has no other good options besides cholecystectomy.    Procedure: After informed consent was obtained the patient was taken to the operating room. She was given antibiotics. SCDs were in place. She was placed undergeneral anesthesia without complication. Her abdomen was prepped and draped in the standard sterile surgical fashion. A surgical timeout was then performed.  I infiltrated marcaine in the left upper quadrant.  I then made an incision. I inserted a 5 mm optiview trocar without difficulty. There was no evidence of entry injury.  She had a fair amount of adhesions to the umbilicus where she had prior repair with mesh. The mesh was visible. I placed an additional 5 mm trocar in the llq. I proceeded to lyse omental adhesions to the mesh with combination of sharp and blunt dissection.  Once I had done this I then made a vertical incision below the mesh and placed a 12 mm trocar.   I then placed a 5 mm trocar in the epigastrium. 2 5 mm trocars were placed in the right side of the abdomen. I grasped the gallbladder and retracted cephalad. The gallbladder had evidence of acute cholecystitis and fluid.  There was bilious fluid in the right upper quadrant as well . I was able to identify the critical view of safety. The gallbladder was very thickened and difficult to grasp consistent with cholecystitis.   I then clipped the duct and divided it. The duct was viable. The clips traversed the duct. I then clipped and divided the cystic artery.   I  then removed the gallbladder from the liver bed. I placed it in a bag and removed from the umbilicus.I obtained hemostasis.I placed apiece of Surgicel in the liver bed overlying this. I then removed the 12 mm trocar and closed this with 2-0 vicryl using the endoclose device. I then removed the remaining trocars and these were closed with 4-0 Monocryl and glue. She tolerated this well be transferred to the  recovery room.  Will hold anticoagulation until tomorrow

## 2016-09-13 NOTE — Progress Notes (Signed)
Transported to OR, report given to [employer].

## 2016-09-13 NOTE — Anesthesia Preprocedure Evaluation (Signed)
Anesthesia Evaluation  Patient identified by MRN, date of birth, ID band Patient awake    Reviewed: Allergy & Precautions, NPO status , [name] Chart, lab work & pertinent test results  Airway Mallampati: III       Dental  (+) Edentulous Upper, Edentulous Lower   Pulmonary former smoker,    breath sounds clear to auscultation       Cardiovascular  Rhythm:Regular Rate:Normal     Neuro/Psych    GI/Hepatic   Endo/Other  diabetes  Renal/GU      Musculoskeletal   Abdominal (+) + obese,   Peds  Hematology   Anesthesia Other Findings   Reproductive/Obstetrics                             Anesthesia Physical Anesthesia Plan  ASA: III  Anesthesia Plan: General   Post-op Pain Management:    Induction: Intravenous  Airway Management Planned: Oral ETT  Additional Equipment: CVP  Intra-op Plan:   Post-operative Plan: Extubation in OR  Informed Consent:   Dental advisory given  Plan Discussed with: CRNA and Anesthesiologist  Anesthesia Plan Comments:         Anesthesia Quick Evaluation

## 2016-09-13 NOTE — Progress Notes (Signed)
Pt refusing all tx and bipap from respiratory at this time.

## 2016-09-13 NOTE — Progress Notes (Signed)
Name: Roberto Hlavaty [facility] MRN: [id number] DOB: [birth year]    ADMISSION DATE:  09/06/2016 CONSULTATION DATE:  4/9  REFERRING MD :  Dr. 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 She reports good compliance with her home oxygen therapy and BiPAP. Reports that she wears BiPAP every night and when necessary during the day. 4/6 she presented to [facility] emergency department with profound dyspnea, which had been progressive over the prior 24 hours. Her BNP was elevated and she was volume overloaded on exam, therefore, she was felt to be short of breath secondary to CHF exacerbation. She was admitted to the hospitalist team and was treated with IV diuresis. She also complained of right-sided abdominal pain. CT scan demonstrated cholelithiasis with some stigmata of cholecystitis versus pancreatitis. HIDA scan negative for bile duct obstruction. She was evaluated by general surgery who feel as though she will require surgery and that a percutaneous drain is not an option. They have requested PCCM consult for preoperative optimization.  SIGNIFICANT EVENTS    STUDIES:  Echo 05/30/14 >> EF 45 to 50% PFT 10/24/14 >> FEV1 0.64 (30%), FEV1% 65, TLC 2.92 (64%), DLCO 54%, + BD Bipap 11/01/15 to 01/29/16 >> used on 89 of 90 nights with average 10 hrs 19 min.  Average AHI 2.4 with Bipap 16/10 cm H2O   US abdomen 4/6 > Cholelithiasis without other sonographic features to suggest acute cholecystitis. No biliary dilatation.Fatty infiltration of the liver.  HIDA 14/7 > Normal exam.  No evidence of cholecystitis or bile duct obstruction. 4/7 CT renal > Cholelithiasis. Subtle nonspecific hazy attenuation of the fat adjacent the porta hepatis which may be seen with cholecystitis or pancreatitis. Minimal aortic  atherosclerosis.    SUBJECTIVE:  Since last seen, she states her breathing has remained stable and at baseline.  On and off cough.  (-) cp.  Uses her bipap with O2.  Tolerating well.    VITAL SIGNS: Temp:  [97.6 F (36.4 C)-98 F (36.7 C)] 97.6 F (36.4 C) (04/13 0422) Pulse Rate:  [60-77] 60 (04/13 0422) Resp:  [16-18] 18 (04/13 0422) BP: (108-118)/(69-96) 108/75 (04/13 0422) SpO2:  [99 %-100 %] 100 % (04/13 0746) Weight:  [94 kg (207 lb 4.8 oz)] 94 kg (207 lb 4.8 oz) (04/13 0422)  PHYSICAL EXAMINATION: General:  Morbidly obese female NAD Neuro:  Awake, alert, oriented.  HEENT:  [place]/AT, PERRL Cardiovascular:  IRIR, borderline tachy. Lungs:  Distant but clear bilateral breath sounds. No wheeze Abdomen:  Soft, non-tender, non-distended Musculoskeletal:  No acute deformity or ROM limitation. BLE +1 edema Skin:  Grossly intact   Recent Labs Lab 09/10/16 1147 09/11/16 0319 09/12/16 0306  NA 133* 134* 137  K 3.8 3.6 3.7  CL 89* 93* 97*  CO2 30 29 27   BUN 53* 58* 54*  CREATININE 2.00* 2.07* 1.61*  GLUCOSE 286* 168* 111*    Recent Labs Lab 09/10/16 1147 09/11/16 0319 09/12/16 0306  HGB 10.7* 9.7* 10.6*  HCT 33.5* 30.3* 33.6*  WBC 14.0* 13.2* 12.8*  PLT 338 316 336   No results found.    Imaging No results found.    Assessment/Plan :  Acute SOB 2/2 acute sCHF exacerbation + mild AECOPD. Severe COPD/asthma - She is relatively improved and at baseline as far as her SOB is concerned.  -  She has diuresced well.  Will defer diuretics per cardiology / primary srvice - Cont with Pulmicort neb 0.5 mg BID + atrovent neb q6 + xopenex neb q6.   - Will hold off on IV steroids for now.  No wheezing appreciated - cont o2 3L which is her baseline  Severe OSA - She is compliant with Bipap  and is treated with her Bipap machine set at 16/10 with 3LO2   Pre-Surgery Evaluation  - Again, I extensively discussed with the patient her increased pulmonary risks for  complication given her severe sleep apnea, severe COPD, chronic hypoxemic respiratory failure, obesity hypoventilation syndrome. She also has other risks from her atrial fibrillation, CHF, diabetes, CKD. She understands her risks and she is willing to take these risks if she has to undergo surgery. She said "she wants to get better".  She is to have surgery today.   - Please page PCCM post op.  I anticipate, she will be left on the ventilator.  If not on the ventilator, she will need to be on NIV/BiPap Her BiPaP at home is set at 16/10 cm water with 3 L o2.  - I mentioned to pt that we will take care of her post op.  Extreme case, if needed, she is OK with a tracheostomy if she ends up being a difficult wean since she said "she wants to get better".    Family :  No family at bedside. I tried calling up spouse and daughter but no answer.    Monica Becton, MD 09/13/2016, 10:39 AM [place] Pager [phone] After 3 pm or if no answer, call [phone]

## 2016-09-13 NOTE — Anesthesia Procedure Notes (Signed)
Procedure Name: Intubation Date/Time: 09/13/2016 11:27 AM Performed by: Rebekah Chesterfield L Pre-anesthesia Checklist: Patient identified, Emergency Drugs available, Suction available and Patient being monitored Patient Re-evaluated:Patient Re-evaluated prior to inductionOxygen Delivery Method: Circle System Utilized Preoxygenation: Pre-oxygenation with 100% oxygen Intubation Type: IV induction Ventilation: Mask ventilation with difficulty, Two handed mask ventilation required and Oral airway inserted - appropriate to patient size Laryngoscope Size: Mac and 3 Grade View: Grade II Tube type: Oral Number of attempts: 1 Airway Equipment and Method: Stylet and Oral airway Placement Confirmation: ETT inserted through vocal cords under direct vision,  positive ETCO2 and breath sounds checked- equal and bilateral Secured at: 20 cm Tube secured with: Tape Dental Injury: Teeth and Oropharynx as per pre-operative assessment  Difficulty Due To: Difficulty was anticipated, Difficult Airway- due to reduced neck mobility and Difficult Airway- due to large tongue

## 2016-09-13 NOTE — Transfer of Care (Signed)
Immediate Anesthesia Transfer of Care Note  Patient: Denise Freeman  Procedure(s) Performed: Procedure(s): LAPAROSCOPIC CHOLECYSTECTOMY (N/A)  Patient Location: PACU  Anesthesia Type:General  Level of Consciousness: awake, alert , oriented and patient cooperative  Airway & Oxygen Therapy: Patient Spontanous Breathing and Patient connected to nasal cannula oxygen  Post-op Assessment: Report given to RN, Post -op Vital signs reviewed and stable and Patient moving all extremities  Post vital signs: Reviewed and stable  Last Vitals:  Vitals:   09/12/16 2008 09/13/16 0422  BP: (!) 112/96 108/75  Pulse: 69 60  Resp: 18 18  Temp: 36.7 C 36.4 C    Last Pain:  Vitals:   09/13/16 0422  TempSrc: Oral  PainSc:       Patients Stated Pain Goal: 1 (21/82/88 3374)  Complications: No apparent anesthesia complications

## 2016-09-13 NOTE — Anesthesia Procedure Notes (Addendum)
Central Venous Catheter Insertion Performed by: Roberts Gaudy, anesthesiologist Start/End4/13/2018 11:20 AM, 09/13/2016 11:25 AM Patient location: OR. Preanesthetic checklist: patient identified, IV checked, site marked, risks and benefits discussed, surgical consent, monitors and equipment checked, pre-op evaluation and timeout performed Position: supine Hand hygiene performed , maximum sterile barriers used  and Seldinger technique used Catheter size: 8 Fr Central line was placed.Double lumen Procedure performed using ultrasound guided technique. Ultrasound Notes:anatomy identified Attempts: 1 Following insertion, line sutured, dressing applied and Biopatch. Post procedure assessment: blood return through all ports, free fluid flow and no air  Patient tolerated the procedure well with no immediate complications.

## 2016-09-13 NOTE — Progress Notes (Signed)
  Subjective: Same ruq pain  Objective: Vital signs in last 24 hours: Temp:  [97.6 F (36.4 C)-98 F (36.7 C)] 97.6 F (36.4 C) (04/13 0422) Pulse Rate:  [60-77] 60 (04/13 0422) Resp:  [14-18] 18 (04/13 0422) BP: (108-118)/(62-96) 108/75 (04/13 0422) SpO2:  [99 %-100 %] 100 % (04/13 0746) Weight:  [94 kg (207 lb 4.8 oz)] 94 kg (207 lb 4.8 oz) (04/13 0422) Last BM Date: 09/11/16  Intake/Output from previous day: 04/12 0701 - 04/13 0700 In: 240 [P.O.:240] Out: 700 [Urine:700] Intake/Output this shift: No intake/output data recorded.  GI: ruq tenderness  Lab Results:   Recent Labs  09/11/16 0319 09/12/16 0306  WBC 13.2* 12.8*  HGB 9.7* 10.6*  HCT 30.3* 33.6*  PLT 316 336   BMET  Recent Labs  09/11/16 0319 09/12/16 0306  NA 134* 137  K 3.6 3.7  CL 93* 97*  CO2 29 27  GLUCOSE 168* 111*  BUN 58* 54*  CREATININE 2.07* 1.61*  CALCIUM 8.3* 8.2*   PT/INR  Recent Labs  09/12/16 0306 09/13/16 0356  LABPROT 21.4* 15.5*  INR 1.83 1.22   ABG No results for input(s): PHART, HCO3 in the last 72 hours.  Invalid input(s): PCO2, PO2  Studies/Results: No results found.  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

## 2016-09-14 ENCOUNTER — Encounter (HOSPITAL_COMMUNITY): Payer: Self-pay | Admitting: General Surgery

## 2016-09-14 LAB — CBC
HCT: 32.3 % — ABNORMAL LOW (ref 36.0–46.0)
Hemoglobin: 10 g/dL — ABNORMAL LOW (ref 12.0–15.0)
MCH: 27.8 pg (ref 26.0–34.0)
MCHC: 31 g/dL (ref 30.0–36.0)
MCV: 89.7 fL (ref 78.0–100.0)
Platelets: 303 10*3/uL (ref 150–400)
RBC: 3.6 MIL/uL — ABNORMAL LOW (ref 3.87–5.11)
RDW: 13.1 % (ref 11.5–15.5)
WBC: 12.1 10*3/uL — ABNORMAL HIGH (ref 4.0–10.5)

## 2016-09-14 LAB — GLUCOSE, CAPILLARY
GLUCOSE-CAPILLARY: 183 mg/dL — AB (ref 65–99)
GLUCOSE-CAPILLARY: 200 mg/dL — AB (ref 65–99)
GLUCOSE-CAPILLARY: 217 mg/dL — AB (ref 65–99)
GLUCOSE-CAPILLARY: 230 mg/dL — AB (ref 65–99)
Glucose-Capillary: 133 mg/dL — ABNORMAL HIGH (ref 65–99)

## 2016-09-14 LAB — COMPREHENSIVE METABOLIC PANEL
ALT: 205 U/L — ABNORMAL HIGH (ref 14–54)
ANION GAP: 12 (ref 5–15)
AST: 254 U/L — ABNORMAL HIGH (ref 15–41)
Albumin: 3.1 g/dL — ABNORMAL LOW (ref 3.5–5.0)
Alkaline Phosphatase: 74 U/L (ref 38–126)
BUN: 39 mg/dL — AB (ref 6–20)
CALCIUM: 8.3 mg/dL — AB (ref 8.9–10.3)
CHLORIDE: 98 mmol/L — AB (ref 101–111)
CO2: 28 mmol/L (ref 22–32)
CREATININE: 1.47 mg/dL — AB (ref 0.44–1.00)
GFR calc non Af Amer: 36 mL/min — ABNORMAL LOW (ref >60)
GFR, EST AFRICAN AMERICAN: 42 mL/min — AB (ref >60)
Glucose, Bld: 203 mg/dL — ABNORMAL HIGH (ref 65–99)
Potassium: 4.1 mmol/L (ref 3.5–5.1)
SODIUM: 138 mmol/L (ref 135–145)
Total Bilirubin: 0.6 mg/dL (ref 0.3–1.2)
Total Protein: 5.8 g/dL — ABNORMAL LOW (ref 6.5–8.1)

## 2016-09-14 LAB — PROTIME-INR
INR: 1.33
PROTHROMBIN TIME: 16.6 s — AB (ref 11.4–15.2)

## 2016-09-14 MED ORDER — METOPROLOL SUCCINATE ER 25 MG PO TB24
25.0000 mg | ORAL_TABLET | Freq: Two times a day (BID) | ORAL | Status: DC
  Administered 2016-09-14: 25 mg via ORAL
  Filled 2016-09-14: qty 1

## 2016-09-14 MED ORDER — INSULIN LISPRO 100 UNIT/ML (KWIKPEN): PEN_INJECTOR | SUBCUTANEOUS | 3 refills | Status: DC

## 2016-09-14 MED ORDER — INSULIN GLARGINE 100 UNIT/ML ~~LOC~~ SOLN
25.0000 [IU] | Freq: Every day | SUBCUTANEOUS | Status: DC
  Filled 2016-09-14: qty 0.25

## 2016-09-14 MED ORDER — BASAGLAR KWIKPEN 100 UNIT/ML ~~LOC~~ SOPN: PEN_INJECTOR | SUBCUTANEOUS | 0 refills | Status: DC

## 2016-09-14 MED ORDER — METOPROLOL SUCCINATE ER 25 MG PO TB24: 25.0000 mg | ORAL_TABLET | Freq: Two times a day (BID) | ORAL | 0 refills | Status: DC

## 2016-09-14 MED ORDER — WARFARIN - PHARMACIST DOSING INPATIENT: Freq: Every day | Status: DC

## 2016-09-14 MED ORDER — FUROSEMIDE 40 MG PO TABS: 40.0000 mg | ORAL_TABLET | Freq: Two times a day (BID) | ORAL | 2 refills | Status: DC

## 2016-09-14 MED ORDER — WARFARIN SODIUM 2 MG PO TABS
2.0000 mg | ORAL_TABLET | Freq: Once | ORAL | Status: AC
  Administered 2016-09-14: 2 mg via ORAL
  Filled 2016-09-14: qty 1

## 2016-09-14 NOTE — Progress Notes (Signed)
ANTICOAGULATION CONSULT NOTE - Follow Up Consult  Pharmacy Consult for Warfarin Indication: atrial fibrillation  Assessment: 66 yo female admitted 09/06/2016 for abdominal pain, now s/p lap-chole. She was anticoagulated pta with warfarin for AFib. INR on admission was 2.66 (therapeutic) on 4 mg daily, except 2 mg Wed/Sat. CHADSVASc score 5. Warfarin was held for procedures. Pharmacy is consulted to restart warfarin today 4/14.   INR is 1.47 (subtherapeutic) given past held doses. Hemoglobin is low stable (10), platelets wnl, no overt bleeding noted.   Goal of Therapy:  INR 2-3 Monitor platelets by anticoagulation protocol: Yes   Plan:  Warfarin 2 mg x 1 (restarting home dose)  Daily INR    Allergies  Allergen Reactions  . Xarelto [Rivaroxaban] Other (See Comments)    Internal bleeding  . Ancef [Cefazolin] Nausea And Vomiting  . Levaquin [Levofloxacin In D5w] Other (See Comments)    "afib"  . Lyrica [Pregabalin] Hives  . Tamiflu [Oseltamivir Phosphate] Other (See Comments)    "water blisters"  . Zoloft [Sertraline Hcl] Other (See Comments)    Jaw problems, jittery  . Augmentin [Amoxicillin-Pot Clavulanate] Itching  . Ciprofloxacin Itching  . Haldol [Haloperidol] Other (See Comments)    Restless leg  . Nsaids Diarrhea  . Penicillins Itching, Nausea And Vomiting and Rash    Has patient had a PCN reaction causing immediate rash, facial/tongue/throat swelling, SOB or lightheadedness with hypotension: Yes Has patient had a PCN reaction causing severe rash involving mucus membranes or skin necrosis: No Has patient had a PCN reaction that required hospitalization No Has patient had a PCN reaction occurring within the last 10 years: Yes If all of the above answers are "NO", then may proceed with Cephalosporin use.  . Topamax [Topiramate] Nausea Only    Patient Measurements: Height: 4\' 11"  (149.9 cm) Weight: 202 lb (91.6 kg) IBW/kg (Calculated) : 43.2  Vital Signs: Temp: 97.4  F (36.3 C) (04/14 0818) Temp Source: Oral (04/14 0818) BP: 98/54 (04/14 0818) Pulse Rate: 66 (04/14 0818)  Labs:  Recent Labs  09/12/16 0306 09/13/16 0356 09/13/16 1911 09/14/16 0450  HGB 10.6*  --  10.1* 10.0*  HCT 33.6*  --  32.9* 32.3*  PLT 336  --  308 303  LABPROT 21.4* 15.5*  --  16.6*  INR 1.83 1.22  --  1.33  CREATININE 1.61*  --  1.35* 1.47*    Estimated Creatinine Clearance: 37.7 mL/min (A) (by C-G formula based on SCr of 1.47 mg/dL (H)).   Medications:  Scheduled:  . atorvastatin  40 mg Oral q1800  . budesonide (PULMICORT) nebulizer solution  0.5 mg Nebulization BID  . escitalopram  10 mg Oral QHS  . fenofibrate  54 mg Oral Daily  . ferrous sulfate  325 mg Oral QHS  . fluticasone  2 spray Each Nare Daily  . furosemide  40 mg Oral BID  . insulin aspart  0-9 Units Subcutaneous Q4H  . insulin glargine  12 Units Subcutaneous Q2200  . ipratropium  0.5 mg Nebulization BID  . levalbuterol  0.63 mg Nebulization BID  . loratadine  10 mg Oral QHS  . mouth rinse  15 mL Mouth Rinse BID  . metoprolol succinate  25 mg Oral BID  . mirtazapine  15 mg Oral QHS  . modafinil  200 mg Oral Daily  . montelukast  10 mg Oral QHS  . pantoprazole  20 mg Oral BID AC  . primidone  50 mg Oral QHS  . sodium chloride flush  3 mL Intravenous Q12H  . warfarin  2 mg Oral ONCE-1800  . Warfarin - Pharmacist Dosing Inpatient   Does not apply L3174    Belia Heman, PharmD PGY1 Pharmacy Resident [phone] (Pager) 09/14/2016 9:42 AM

## 2016-09-14 NOTE — Evaluation (Signed)
Physical Therapy Evaluation Patient Details Name: Denise Freeman MRN: [id number] DOB: [birth year] Today's Date: 09/14/2016   History of Present Illness  Patient is a 66 yo female admitted 09/06/16 with RUQ pain and SOB.  Patient with cholecystitis and s/p laparoscopic cholecystectomy 09/13/16.  Patient also with CHF and decrease in EF to 30-35%.    PMH:  DM, CKD, Afib, CHF, COPD on O2 at 3L, asthma, bipolar disorder, OSA on CPAP, HLD, depression  Clinical Impression  Patient requires assist for bed mobility.  Is able to transfer bed <> chair with min guard assist.  Per patient and husband, this is at/near her baseline.  Patient uses electric scooter for mobility, and husband assists with all ADL's.  Do not feel patient needs any f/u PT - patient/husband agree.  No equipment needs.  Patient ready for d/c from PT perspective.  PT will sign off.    Follow Up Recommendations No PT follow up;Supervision/Assistance - 24 hour    Equipment Recommendations  None recommended by PT    Recommendations for Other Services       Precautions / Restrictions Precautions Precautions: Fall Restrictions Weight Bearing Restrictions: No      Mobility  Bed Mobility Overal bed mobility: Needs Assistance Bed Mobility: Supine to Sit;Sit to Supine     Supine to sit: Mod assist Sit to supine: Mod assist   General bed mobility comments: Patient requiring assist for bed mobility primarily due to abdominal pain post-op.  Husband able to assist patient safely.  Transfers Overall transfer level: Needs assistance Equipment used: None Transfers: Sit to/from [facility] Sit to Stand: Min guard Stand pivot transfers: Min guard       General transfer comment: Patient able to move bed <> chair with min guard assist for safety.  No loss of balance during transfers.  Ambulation/Gait             General Gait Details: NT - patient does not ambulate at home.  Uses scooter.  Stairs             Wheelchair Mobility    Modified Rankin (Stroke Patients Only)       Balance Overall balance assessment: Needs assistance Sitting-balance support: No upper extremity supported;Feet supported Sitting balance-Leahy Scale: Fair     Standing balance support: Single extremity supported;During functional activity Standing balance-Leahy Scale: Poor Standing balance comment: Requires UE support for balance in stance/transfers.                             Pertinent Vitals/Pain Pain Assessment: 0-10 Pain Score: 6  Pain Location: Abdomen Pain Descriptors / Indicators: Sore;Operative site guarding Pain Intervention(s): Monitored during session;Repositioned    Home Living Family/patient expects to be discharged to:: Private residence Living Arrangements: Spouse/significant other Available Help at Discharge: Family;Available 24 hours/day Type of Home: Mobile home Home Access: Ramped entrance     Home Layout: One level Home Equipment: [place] - 4 wheels;Bedside commode;Shower seat;Wheelchair - power;Electric scooter (elevating bed)      Prior Function Level of Independence: Needs assistance   Gait / Transfers Assistance Needed: Patient uses scooter for mobility.  Husband assists with bed mobility and transfers as needed.  ADL's / Homemaking Assistance Needed: Husband assists with bathing, dressing, meal prep, housekeeping, transportation        Hand Dominance        Extremity/Trunk Assessment   Upper Extremity Assessment Upper Extremity Assessment: Overall WFL for tasks  assessed    Lower Extremity Assessment Lower Extremity Assessment: Generalized weakness       Communication   Communication: No difficulties  Cognition Arousal/Alertness: Awake/alert Behavior During Therapy: WFL for tasks assessed/performed;Flat affect Overall Cognitive Status: Within Functional Limits for tasks assessed                                         General Comments      Exercises     Assessment/Plan    PT Assessment Patent does not need any further PT services  PT Problem List         PT Treatment Interventions      PT Goals (Current goals can be found in the Care Plan section)  Acute Rehab PT Goals Patient Stated Goal: To return home PT Goal Formulation: All assessment and education complete, DC therapy    Frequency     Barriers to discharge        Co-evaluation               End of Session Equipment Utilized During Treatment: Gait belt;Oxygen Activity Tolerance: Patient tolerated treatment well;Patient limited by pain Patient left: in bed;with call bell/phone within reach;with family/visitor present Nurse Communication: Mobility status (Ready for d/c from PT perspective.  No PT needs.) PT Visit Diagnosis: Unsteadiness on feet (R26.81);Muscle weakness (generalized) (M62.81);Pain Pain - Right/Left: Right Pain - part of body:  (Abdomen - RUQ)    Time: 8264-1583 PT Time Calculation (min) (ACUTE ONLY): 18 min   Charges:   PT Evaluation $PT Eval Moderate Complexity: 1 Procedure     PT G Codes:        Carita Pian. Sanjuana Kava, [facility] Acute Rehab Services Pager [place] 09/14/2016, 4:57 PM

## 2016-09-14 NOTE — Progress Notes (Signed)
1 Day Post-Op  Subjective: CC eating, doing well  Objective: Vital signs in last 24 hours: Temp:  [97.4 F (36.3 C)-98.1 F (36.7 C)] 97.4 F (36.3 C) (04/14 0818) Pulse Rate:  [65-107] 65 (04/14 0917) Resp:  [10-21] 17 (04/14 0917) BP: (92-128)/(54-95) 106/64 (04/14 0917) SpO2:  [95 %-100 %] 95 % (04/14 0917) FiO2 (%):  [30 %] 30 % (04/13 1345) Weight:  [91.6 kg (202 lb)] 91.6 kg (202 lb) (04/14 0500) Last BM Date: 09/11/16  Intake/Output from previous day: 04/13 0701 - 04/14 0700 In: 590 [P.O.:240; I.V.:350] Out: 720 [Urine:700; Blood:20] Intake/Output this shift: Total I/O In: -  Out: 150 [Urine:150]  ABD soft, incisions CDI  Lab Results:   Recent Labs  09/13/16 1911 09/14/16 0450  WBC 13.1* 12.1*  HGB 10.1* 10.0*  HCT 32.9* 32.3*  PLT 308 303   BMET  Recent Labs  09/13/16 1911 09/14/16 0450  NA 136 138  K 4.4 4.1  CL 99* 98*  CO2 26 28  GLUCOSE 147* 203*  BUN 40* 39*  CREATININE 1.35* 1.47*  CALCIUM 8.2* 8.3*   PT/INR  Recent Labs  09/13/16 0356 09/14/16 0450  LABPROT 15.5* 16.6*  INR 1.22 1.33   ABG No results for input(s): PHART, HCO3 in the last 72 hours.  Invalid input(s): PCO2, PO2  Studies/Results: Dg Chest Port 1 View  Result Date: 09/13/2016 CLINICAL DATA:  66 year old female with a history of status post laparoscopic cholecystectomy EXAM: PORTABLE CHEST 1 VIEW COMPARISON:  09/06/2016 FINDINGS: Cardiomediastinal silhouette unchanged with cardiomegaly. Interval placement of left IJ central venous catheter, with the tip terminating at the superior mediastinum, to the left of midline. Interval development opacity at the right lung base with partial obscuration of the right hemidiaphragm and the right heart border. No pneumothorax Opacity in the retrocardiac region at the left lung base. No displaced fracture. Surgical changes of the cervical region. IMPRESSION: Interval development of opacity at the right greater than left lung base,  potentially combination of pleural fluid, edema, and/or atelectasis/consolidation. Interval placement of left IJ central venous catheter. The tip of the catheter terminates to the left the midline, projecting over the upper mediastinum. Course of the catheter is nonspecific, and the x-ray is non diagnostic for confirming venous location. If there is any concern for whether the catheter is arterial or venous placement, correlation with blood gas may be considered. No pneumothorax. Electronically Signed   By: Corrie Mckusick D.O.   On: 09/13/2016 15:02    Anti-infectives: Anti-infectives    Start     Dose/Rate Route Frequency Ordered Stop   09/13/16 1100  ciprofloxacin (CIPRO) IVPB 400 mg     400 mg 200 mL/hr over 60 Minutes Intravenous  Once 09/13/16 1059 09/13/16 1150      Assessment/Plan: s/p Procedure(s): LAPAROSCOPIC CHOLECYSTECTOMY (N/A) OK to D/C when medically ready from our standpoint. PT eval.  LOS: 8 days    Vester Titsworth E 09/14/2016

## 2016-09-14 NOTE — Progress Notes (Signed)
Family Medicine Teaching Service Daily Progress Note Intern Pager: [phone]  Patient name: Denise Freeman [facility] Medical record number: [id number] Date of birth: Apr 19, 1951 Age: 66 y.o. Gender: female  Primary Care Provider: Darci Needle, MD Consultants: Cardiology, General surgery Code Status: FULL  Pt Overview and Major Events to Date:  04/06: Admit for abdominal pain and SOB 04/07: HIDA negative 04/11: Risks for laparoscopic cholecystectomy discussed, patient wants to proceed with surgery 04/12: INR 1.83, plan to proceed with lap chole 4/13 04/13: INR 1.22, to OR for lap chole  Assessment and Plan: Denise Freeman is a 66 y.o. female presenting with RUQ pain and shortness of breath. 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   #RUQ Abdominal Pain, improved: VSS and afebrile s/p lap chole. Was thought to only have symptomatic cholelithiasis but found to have evidence of acute cholecystitis during operation 4/13. Luckily recovering well, as had concern for difficult extubation per pulm and worsening LV function per cardiology. Tolerating full diet this a.m. -- Surgery recs: Okay to d/c when medically ready -- PT eval for imminent discharge placed  #Acute on Chronic HFrEF: Resolved. UOP -5.5 since admission. Appears euvolemic. Nuc stress test high risk study for worsened LV dysfunction, EF 26% (30-35% on echo), however no ischemia, flat trop trend. 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 Rate controlled. On BB. --Restart  coumadin per pharmacy today, 4/14 --Continue Toprol-XL 25 mg BID --Per cards recs try to avoid use of amio given underlying lung disease  #Insulin-dependent diabetes mellitus: Chronic. Stable. CBGs 100-200. Home: Lantus 40 units at bedtime, SSI, Metformin.  --Holding Metformin --Increase Lantus 25 U QHS, as now tolerating PO --CBGs TID, QHS, sensitive SSI    #Acute on Chronic kidney disease stage III: Cr improved with decreased lasix. Baseline appears to be 1.1-1.3 --Avoid nephrotoxic agents  #COPD  Asthma: Possible acute on chronic exacerbation. Stable. On 3L [place] at home.  --Singulair with PRN duoneb --Atrovent, Levalbuterol  #Hyperlipidemia: Chornic.  --Fenofibrate 54 mg QD  #Obstructive sleep apnea: Chronic. On BiPAP. --BiPAP QHS --Modafinil 200 mg QD  #Vitamin D deficiency: Chronic. On supplementation. --Continue Vit D   #Depression: No active depression since admission. --Remeron 15 mg QHS  #Chronic Pain: Continue home Percocet regimen  #Hypokalemia: Acute. Resolved.  FEN/GI: Carb modified/ heart healthy Prophylaxis: Restart coumadin   Disposition: D/C pending PT eval  Subjective:  Patient reports she feels well, tolerated breakfast that included hash browns, and is eager to go home and have CVC removed. Says pain is well controlled but has not been able to get up yet.   Objective: Temp:  [97.4 F (36.3 C)-98.1 F (36.7 C)] 97.4 F (36.3 C) (04/14 0818) Pulse Rate:  [66-107] 66 (04/14 0818) Resp:  [10-21] 20 (04/14 0818) BP: (92-128)/(54-95) 98/54 (04/14 0818) SpO2:  [95 %-100 %] 97 % (04/14 0424) FiO2 (%):  [30 %] 30 % (04/13 1345) Weight:  [202 lb (91.6 kg)] 202 lb (91.6 kg) (04/14 0500) Physical Exam: General: obese, alert, in no acute distress with non-toxic appearance HEENT: normocephalic, atraumatic, edentulous CV: Irregularly irregular, no  rubs, or gallops Lungs: clear to auscultation bilaterally with normal work of breathing Abdomen:  normoactive bowel sounds, some guarding with palpation but does feel soft Skin: warm, dry, no erythema around laparoscopic sites, cap refill < 2 seconds Extremities: warm and well perfused, normal tone, trace edema bilateral lower extremities  Laboratory:  Recent Labs Lab 09/12/16 0306 09/13/16 1911 09/14/16 0450  WBC 12.8* 13.1* 12.1*  HGB 10.6* 10.1* 10.0*  HCT 33.6* 32.9* 32.3*  PLT 336 308 303    Recent Labs Lab 09/12/16 0306 09/13/16 1911 09/14/16 0450  NA 137 136 138  K 3.7 4.4 4.1  CL 97* 99* 98*  CO2 27 26 28   BUN 54* 40* 39*  CREATININE 1.61* 1.35* 1.47*  CALCIUM 8.2* 8.2* 8.3*  PROT  --  6.1* 5.8*  BILITOT  --  0.9 0.6  ALKPHOS  --  81 74  ALT  --  223* 205*  AST  --  316* 254*  GLUCOSE 111* 147* 203*   Lipase: 13 (WNL)  BNP: 680 > 644 (baseline 200s) Troponin: 0.03 > 0.05 > 0.05 > 0.04 INR: 2.65 > 2.42 > 2.33 > 2.79 > 1.83 Magnesium: 1.8 (WNL) UCx: <10,000 colonies, insignificant growth  Imaging/Diagnostic Tests: NM Myocar Multi W/Spect W/Wall Motion / EF (09/10/2016) There was no ST segment deviation noted during stress. No T wave inversion was noted during stress. Defect 1: There is a medium defect of moderate severity. This is a high risk study. Nuclear stress EF: 26%.   Medium size, moderate intensity fixed septal bundle branch/PVC-related artifact. No reversible ischemia. LVEF 26% with global hypokinesis and septal dyskinesis. This is a high risk study, given severely depressed LVEF.  CT RENAL STONE STUDY (09/07/2016) FINDINGS: Lower chest: Lung bases are within normal.  Mild cardiomegaly. Hepatobiliary: Liver and biliary tree are within normal. There is mild cholelithiasis present. Subtle nonspecific hazy attenuation within the fat adjacent over the porta hepatis. Pancreas: Normal. Spleen: Normal. Adrenals/Urinary Tract: Adrenal glands are normal. Kidneys are normal in size without hydronephrosis or nephrolithiasis. Ureters and bladder are  normal. Stomach/Bowel: Stomach and small bowel are normal. Prior appendectomy. Colon is within normal. Vascular/Lymphatic: Minimal calcified plaque over the abdominal aorta and iliac arteries. Remaining vascular structures are within normal. No adenopathy. Reproductive: Within normal. Other: No free fluid.  No free peritoneal air. Musculoskeletal: Mild degenerate change of the spine and hips.  IMPRESSION: Cholelithiasis. Subtle nonspecific hazy attenuation of the fat adjacent the porta hepatis which may be seen with cholecystitis or pancreatitis. Minimal aortic atherosclerosis.  NM Hepatobiliary Liver Func (09/07/2016) FINDINGS: Prompt uptake and biliary excretion of activity by the liver is seen. Gallbladder activity is visualized, consistent with patency of cystic duct. Biliary activity passes into small bowel, consistent with patent common bile duct.  IMPRESSION: Normal exam.  No evidence of cholecystitis or bile duct obstruction.  US Abdomen Limited RUQ (09/06/2016) FINDINGS: Gallbladder: Multiple shadowing stones measuring up to 8.5 mm. Normal wall thickness. Negative sonographic Murphy's. Common bile duct: Diameter: Normal at 4.9 mm Liver: Coarse echogenic liver consistent with fatty infiltration. No ascites.  IMPRESSION: 1. Cholelithiasis without other sonographic features to suggest acute cholecystitis. No biliary dilatation. 2. Fatty infiltration of the liver  DG Chest 2 View (09/06/2016) FINDINGS: The heart is enlarged but stable. The mediastinal and hilar contours are within normal limits and unchanged. Mild vascular congestion and chronic bronchitic changes but no pulmonary edema, pleural effusions or focal infiltrates.  IMPRESSION: Cardiac enlargement with mild vascular congestion and chronic bronchitic  changes but no infiltrates, edema or effusions.  Rogue Bussing, MD 09/14/2016, 8:47 AM PGY-2, [place] Intern pager: [phone],  text pages welcome

## 2016-09-16 ENCOUNTER — Other Ambulatory Visit: Payer: Self-pay

## 2016-09-16 DIAGNOSIS — I509 Heart failure, unspecified: Secondary | ICD-10-CM

## 2016-09-17 ENCOUNTER — Encounter: Payer: Self-pay | Admitting: Family Medicine

## 2016-09-17 ENCOUNTER — Ambulatory Visit (INDEPENDENT_AMBULATORY_CARE_PROVIDER_SITE_OTHER): Payer: Medicare Other | Admitting: Family Medicine

## 2016-09-17 ENCOUNTER — Telehealth: Payer: Self-pay | Admitting: *Deleted

## 2016-09-17 VITALS — BP 98/70 | HR 52 | Temp 98.5°F | Ht 59.0 in | Wt 210.0 lb

## 2016-09-17 DIAGNOSIS — I5022 Chronic systolic (congestive) heart failure: Secondary | ICD-10-CM | POA: Diagnosis not present

## 2016-09-17 DIAGNOSIS — E11319 Type 2 diabetes mellitus with unspecified diabetic retinopathy without macular edema: Secondary | ICD-10-CM

## 2016-09-17 DIAGNOSIS — Z9049 Acquired absence of other specified parts of digestive tract: Secondary | ICD-10-CM

## 2016-09-17 DIAGNOSIS — N183 Chronic kidney disease, stage 3 unspecified: Secondary | ICD-10-CM

## 2016-09-17 DIAGNOSIS — Z794 Long term (current) use of insulin: Secondary | ICD-10-CM

## 2016-09-17 NOTE — Progress Notes (Signed)
Subjective:   Denise Freeman is a 66 y.o. female with a history of Systolic CHF, A. fib, T2 DM, CK 83, COPD, HLD, OSA, depression here for hospital f/u for symptomatic cholelithisis s/p lap cholecystectomy 09/13/16  Review of Hospitalization:Patient was admitted on 09/06/16 for right upper quadrant pain and shortness of breath. She was found to have a mild leukocytosis of 11.8 with a stable hemoglobin. She was afebrile and mildly tachycardic. Her labs are otherwise unremarkable. Her right upper quadrant ultrasound showed cholelithiasis with possible acute cholecystitis without biliary dilation. Her height is scan was normal without evidence of cholecystitis or bile duct obstruction. Despite being deemed high risk for the surgery with an EF 26% and global hypokinesis and septal dyskinesia as well as the possibility of need for prolonged intubation given her history of COPD, OSA, OHS, she decided to go ahead with surgery. She underwent a lap cholecystectomy on 09/13/2016. The operation was uncomplicated and findings were consistent with cholecystitis. She was successfully extubated postoperatively and was tolerating a diet and safe for discharge the following day.  Currently: Continues to have abdominal soreness.  Worried about fluid retention after stopping metolazone and taking lasix 40mg  BID (home dose was 80mg  BID prior to hospitalization).  Feeling SOB again, +LE edema.  Taking fluids well. Tolerating foods, even tried bacon yesterday.  No N/V, change in chronic diarrhea, CP.  CBGs have been 130s pre-prandial and fastings 160-180.  Taking Metformin, Basaglar 25 units daily, Humalog TID SSI qac (was decreased due to patient's poor intake during hospitalization).  Review of Systems:  Per HPI.   Social History: Former smoker  Objective:  BP 98/70   Pulse (!) 52   Temp 98.5 F (36.9 C) (Oral)   Ht 4\' 11"  (1.499 m)   Wt 210 lb (95.3 kg)   BMI 42.41 kg/m   Gen:  66 y.o. female in  [place] HEENT: NCAT, MMM, anicteric sclerae Neck: Ecchymoses diffusely (patient reports these are from her central line) CV: Irregularly irregular, no murmurs Resp: Non-labored, 3 L O2 in place via nasal cannula, distant lung sounds with by basilar crackles Abd: Soft, diffusely tender to mild palpation, BS present, no rebound/guarding. Incision sites surrounded by ecchymoses but clean dry and intact Ext: WWP, 2+ edema bilaterally MSK: Moves all extremities Neuro: Alert and oriented, speech normal       Chemistry      Component Value Date/Time   NA 142 09/17/2016 1546   K 4.6 09/17/2016 1546   CL 99 09/17/2016 1546   CO2 30 (H) 09/17/2016 1546   BUN 22 09/17/2016 1546   CREATININE 0.99 09/17/2016 1546   CREATININE 1.15 (H) 07/18/2016 1023      Component Value Date/Time   CALCIUM 8.6 (L) 09/17/2016 1546   ALKPHOS 74 09/14/2016 0450   AST 254 (H) 09/14/2016 0450   ALT 205 (H) 09/14/2016 0450   BILITOT 0.6 09/14/2016 0450      Lab Results  Component Value Date   WBC 12.1 (H) 09/14/2016   HGB 10.0 (L) 09/14/2016   HCT 32.3 (L) 09/14/2016   MCV 89.7 09/14/2016   PLT 303 09/14/2016   No results found for: TSH Lab Results  Component Value Date   HGBA1C 7.5 07/22/2016   Assessment & Plan:     Jordie Skalsky Pardoe is a 66 y.o. female here for   CKD (chronic kidney disease) stage 3, GFR 30-59 ml/min Recheck BMP to evaluate creatinine and potassium  Acute systolic congestive heart failure (  [place]) Seems to be hypervolemic in the setting of reduced diuretic doses due to poor intake during hospitalization As patient's blood pressure is soft today, we will carefully titrate her medications Continue Lasix at 40 mg twice a day Of note, her home dose previously was 80 mg twice a day and she may not be responding to 40 mg dosing Resume small dose of metolazone at 1.25 mg daily Recheck BMP and evaluate potassium as she is not currently taking potassium supplements Follow-up in 2-3 days  to monitor blood pressure while titrating for appropriate volume status  Diabetes mellitus with ophthalmic complication (HCC) Blood glucose is elevated on lower dose of insulin, she was taking before hospitalization We will increase her long-acting insulin slightly from 25-30 units She should continue the sensitive sliding scale insulin with her Humalog at mealtime We will continue to titrate at follow-up visits Encourage patient to bring log of blood glucoses to her next appointment  S/P laparoscopic cholecystectomy Abdomen appears to be healing well Patient is tolerating oral intake of liquids and food well Did advise patient that high-fat containing foods may give her diarrhea after cholecystectomy She is to follow-up with surgery   Virginia Crews, MD MPH PGY-3,  [facility] 09/18/2016  2:45 PM

## 2016-09-17 NOTE — Patient Outreach (Addendum)
[facility]) Care Management  09/17/2016  Denise Freeman [facility] 02-17-1951 [id number]   THN CM left voice message for Denise Freeman including THN CM mobile number for a return call.  THN CM attempting to schedule THN CM home visit with Denise Freeman after she was discharged from hospital.    Plans:  [facility] CM will attempt to contact Denise Freeman again this week for scheduling of home visit and assessment of home needs  Arshi Duarte L. Lavina Hamman, RN, BSN, [place] Care Management [phone]

## 2016-09-17 NOTE — Patient Instructions (Signed)
Nice to meet you today. Increase her long-acting insulin at bedtime from 25-30 units daily at bedtime area due to continue your current sliding scale insulin of the Humalog with each meal. Continue the Lasix at the current dose. Resume metolazone 1.25 mg daily. We are getting labs today and someone will call you with the results when they're available. We will let you know if you need to resume your potassium pills.  Follow-up in 2-3 days so we can watch your blood pressure closely.  Take care, Dr. Jacinto Reap

## 2016-09-18 ENCOUNTER — Telehealth: Payer: Self-pay | Admitting: *Deleted

## 2016-09-18 ENCOUNTER — Ambulatory Visit (INDEPENDENT_AMBULATORY_CARE_PROVIDER_SITE_OTHER): Payer: Medicare Other | Admitting: *Deleted

## 2016-09-18 DIAGNOSIS — I482 Chronic atrial fibrillation: Secondary | ICD-10-CM | POA: Diagnosis not present

## 2016-09-18 DIAGNOSIS — I480 Paroxysmal atrial fibrillation: Secondary | ICD-10-CM | POA: Diagnosis not present

## 2016-09-18 DIAGNOSIS — I4821 Permanent atrial fibrillation: Secondary | ICD-10-CM

## 2016-09-18 LAB — BASIC METABOLIC PANEL
BUN / CREAT RATIO: 22 (ref 12–28)
BUN: 22 mg/dL (ref 8–27)
CO2: 30 mmol/L — ABNORMAL HIGH (ref 18–29)
CREATININE: 0.99 mg/dL (ref 0.57–1.00)
Calcium: 8.6 mg/dL — ABNORMAL LOW (ref 8.7–10.3)
Chloride: 99 mmol/L (ref 96–106)
GFR, EST AFRICAN AMERICAN: 69 mL/min/{1.73_m2} (ref >59)
GFR, EST NON AFRICAN AMERICAN: 60 mL/min/{1.73_m2} (ref >59)
Glucose: 125 mg/dL — ABNORMAL HIGH (ref 65–99)
Potassium: 4.6 mmol/L (ref 3.5–5.2)
SODIUM: 142 mmol/L (ref 134–144)

## 2016-09-18 LAB — POCT INR: INR: 1.5

## 2016-09-18 NOTE — Assessment & Plan Note (Signed)
Recheck BMP to evaluate creatinine and potassium

## 2016-09-18 NOTE — Assessment & Plan Note (Signed)
Blood glucose is elevated on lower dose of insulin, she was taking before hospitalization We will increase her long-acting insulin slightly from 25-30 units She should continue the sensitive sliding scale insulin with her Humalog at mealtime We will continue to titrate at follow-up visits Encourage patient to bring log of blood glucoses to her next appointment

## 2016-09-18 NOTE — Patient Outreach (Signed)
[facility]) Care Management  09/18/2016  Clorine Swing [facility] [birth year] [id number]   Transition of care call #1 THN CM was able to reach Mrs Wynter to schedule [facility] CM home visit for 09/23/16 at 1200  Mrs Stanislaw allowed to ventilate her feelings and voiced concerns about her feet and abdomen swelling to the point of being painful.  She voiced concerns with changes in diuretics that is not being effective in managing her CHF and oxygen needs.  She reports contacting [employer], her husband waiting for oxygen arrival without success and concern about needs for Oxygen to get to her medical appointments scheduled for 09/19/16 and 09/20/16.    THN CM was able to complete come Transition of care assessment during the call. Mrs Diffee has obtained medications and reports she is taking them as ordered and has called for follow up appointments  [facility] encouraged her to contact these providers again on 09/19/16.Mrs Dorion is scheduled to return to her pcp office on 09/19/16.  CM reviewed CHF zone symptoms and when to contact providers.     Plans THN CM will place calls to [place] and Mrs [facility] CV provider and updated her on recommendations [facility] CM will see Mrs Leonhardt on Next week for home visit per her preference  Joelene Millin L. Lavina Hamman, RN, BSN, [place] Care Management [phone]

## 2016-09-18 NOTE — Assessment & Plan Note (Signed)
Seems to be hypervolemic in the setting of reduced diuretic doses due to poor intake during hospitalization As patient's blood pressure is soft today, we will carefully titrate her medications Continue Lasix at 40 mg twice a day Of note, her home dose previously was 80 mg twice a day and she may not be responding to 40 mg dosing Resume small dose of metolazone at 1.25 mg daily Recheck BMP and evaluate potassium as she is not currently taking potassium supplements Follow-up in 2-3 days to monitor blood pressure while titrating for appropriate volume status

## 2016-09-18 NOTE — Assessment & Plan Note (Signed)
Abdomen appears to be healing well Patient is tolerating oral intake of liquids and food well Did advise patient that high-fat containing foods may give her diarrhea after cholecystectomy She is to follow-up with surgery

## 2016-09-19 ENCOUNTER — Ambulatory Visit (INDEPENDENT_AMBULATORY_CARE_PROVIDER_SITE_OTHER): Payer: Medicare Other | Admitting: Family Medicine

## 2016-09-19 ENCOUNTER — Encounter: Payer: Self-pay | Admitting: Family Medicine

## 2016-09-19 ENCOUNTER — Telehealth: Payer: Self-pay | Admitting: Pharmacist

## 2016-09-19 ENCOUNTER — Telehealth: Payer: Self-pay | Admitting: *Deleted

## 2016-09-19 ENCOUNTER — Telehealth: Payer: Self-pay | Admitting: Internal Medicine

## 2016-09-19 VITALS — BP 98/66 | HR 59 | Temp 98.0°F | Wt 211.0 lb

## 2016-09-19 DIAGNOSIS — I5021 Acute systolic (congestive) heart failure: Secondary | ICD-10-CM | POA: Diagnosis present

## 2016-09-19 NOTE — Telephone Encounter (Signed)
Tried calling the patient. It went to voicemail. Left a message to return my call.

## 2016-09-19 NOTE — Telephone Encounter (Signed)
Left message on voicemail for patient to call back. 

## 2016-09-19 NOTE — Telephone Encounter (Signed)
New Message     THN:  Denise Freeman told her that her legs are swollen to the point she can not walk, she has increased taking the Lasix 80mg  DID she is having pain in her feet , SOB   Pt c/o swelling: STAT is pt has developed SOB within 24 hours  1. How long have you been experiencing swelling? Since Discharge from hospital 4/14  2. Where is the swelling located? Feet and legs   3.  Are you currently taking a "fluid pill"?yes  4.  Are you currently SOB? yes  5.  Have you traveled recently?no

## 2016-09-19 NOTE — Telephone Encounter (Signed)
-----   Message from Virginia Crews, MD sent at 09/18/2016  2:00 PM EDT ----- Please let patient know that her kidney function and potassium are normal.  She can continue to hold her potassium pills.  We will recheck at next visit.  Thanks!  Virginia Crews, MD, MPH PGY-3,  [facility] [place] [facility] 09/18/2016 2:00 PM

## 2016-09-19 NOTE — Telephone Encounter (Signed)
[name] from [facility] called in about a visit with the patient. She stated that the patient stated that she has had a 10 pound weight gain since her hospital visit on 4/14. She had been admitted for RUQ pain and SOB. The patient stated that she was 198 at the hospital and yesterday per the PCP note she was 210. She did have a check up with her PCP on 4/18 where the following changes were made:  Seems to be hypervolemic in the setting of reduced diuretic doses due to poor intake during hospitalization As patient's blood pressure is soft today, we will carefully titrate her medications Continue Lasix at 40 mg twice a day Of note, her home dose previously was 80 mg twice a day and she may not be responding to 40 mg dosing Resume small dose of metolazone at 1.25 mg daily Recheck BMP and evaluate potassium as she is not currently taking potassium supplements Follow-up in 2-3 days to monitor blood pressure while titrating for appropriate volume status  According to the note from the physician her potassium and kidney function were normal.  Please let patient know that her kidney function and potassium are normal.  She can continue to hold her potassium pills.  We will recheck at next visit.  Thanks!  Virginia Crews, MD, MPH PGY-3,  [place] [facility] 09/18/2016 2:00 PM  Her furosemide has been reduced to 40 mg twice daily. Will call the patient for assessment.

## 2016-09-19 NOTE — Progress Notes (Signed)
   Subjective:   Denise Freeman is a 66 y.o. female with a history of Systolic CHF, A. fib, T2 DM, CK D3, COPD, HLD, OSA, depression here for CHF follow-up  She was seen 09/17/16 for hospital follow-up. Her diuretics have been decreased from Lasix 80 mg twice a day and metolazone 2.5 daily prior to hospitalization to Lasix 40 mg twice a day and no metolazone due to poor by mouth intake and hypotension. At her follow-up appointment, her blood pressure was 98/70. She was hypervolemic, so metolazone 1.25 mg daily was resumed in addition to Rex 40 mg twice a day. She reports that she feels like her guts are ready to pop and she is short of breath and her legs are swollen. She knows that she needs more diuresis and his not being enough currently. She denies any dizziness, chest pain. From chart review, it appears her dry weight is 198 pounds and she was 202 pounds on discharge. On 4/17 she weighed 210 pounds and she is 211 pounds today.  Review of Systems:  Per HPI.   Social History: Former smoker  Objective:  BP 98/66   Pulse (!) 59   Temp 98 F (36.7 C) (Oral)   Wt 211 lb (95.7 kg)   SpO2 97%   BMI 42.62 kg/m   Gen:  66 y.o. female in NAD HEENT: NCAT, MMM, anicteric sclerae CV: Irregularly irregular, no MRG Resp: Unlabored, 3 L O2 in place via nasal cannula, distant lung sounds with bibasilar crackles Abd: Soft, diffusely tender to palpation, ND, BS present, no guarding or organomegaly Ext: WWP, 2+ edema MSK: Moves all extremities Neuro: Alert and oriented, speech normal       Chemistry      Component Value Date/Time   NA 142 09/17/2016 1546   K 4.6 09/17/2016 1546   CL 99 09/17/2016 1546   CO2 30 (H) 09/17/2016 1546   BUN 22 09/17/2016 1546   CREATININE 0.99 09/17/2016 1546   CREATININE 1.15 (H) 07/18/2016 1023      Component Value Date/Time   CALCIUM 8.6 (L) 09/17/2016 1546   ALKPHOS 74 09/14/2016 0450   AST 254 (H) 09/14/2016 0450   ALT 205 (H) 09/14/2016 0450   BILITOT 0.6 09/14/2016 0450      Lab Results  Component Value Date   WBC 12.1 (H) 09/14/2016   HGB 10.0 (L) 09/14/2016   HCT 32.3 (L) 09/14/2016   MCV 89.7 09/14/2016   PLT 303 09/14/2016   No results found for: TSH Lab Results  Component Value Date   HGBA1C 7.5 07/22/2016   Assessment & Plan:     Denise Freeman is a 66 y.o. female here for   Acute systolic congestive heart failure (HCC) Hypervolemic in the setting of reduced diuretic doses Her weight is up 13 pounds from what appears to be her dry weight From chart review, it appears her dry weight is 198 pounds, she is 202 pounds on discharge, 210 pounds on 4/17 follow-up, 211 pounds today Increase Lasix to 80 mg twice daily Continue metolazone at 1.25 mg daily Potassium was stable 2 days ago, we will plan to recheck again in next visit We need to follow her closely and carefully titrate her medications as her blood pressure remains soft Follow-up next week and titrate as indicated   Virginia Crews, MD MPH PGY-3,  [facility] 09/20/2016  10:09 AM

## 2016-09-19 NOTE — Patient Outreach (Addendum)
[facility]) Care Management  09/19/2016  Nolia Tschantz [facility] 1950-12-26 [id number]   Patient was called per referral from [place], Natividad Brood.  No answer. HIPAA compliant message left on voicemail.    [name] chart was reviewed. Patient reported a 10 pound increase in weight since discharge to [facility] Nurse, Jackelyn Poling.  [name] called the providers office and they adjusted the patient's diuretics and reached out to the patient unsuccessfully.   Plan:  I will conduct an initial pharmacy home visit with [place] Nurse Joellyn Quails on 09/23/16.   [name], PharmD, BCACP [facility] Clinical Pharmacist [phone]  ADDENDUM:  Patient called back.  HIPAA identifiers were obtained.  Patient is a 66 year old female with multiple medical conditions including but not limited to:  Type 2 diabetes, CKD3, Afib, CHF, COPD with asthmaon 3L O2, Bipolar, OSA,Vit D deficiency, and hyperlipidemia.  Patient reported managing her medications on her own.  She did not have time to review her medications as she reported having a doctor's appointment today at 4pm.  Patient said she has difficulty affording her medications--insulin in particular.  Initial conversation revealed the patient and her husband are most likely over income for the extra help program.  Although this can be further vetted during the upcoming home visit.  She confirmed our home visit appointment for Monday at 12 noon.  Plan:  Conduct home visit on 09/23/16  [name], PharmD, [place] Clinical Pharmacist [phone]

## 2016-09-19 NOTE — Patient Instructions (Addendum)
Nice to see you again today.  Increase Lasix to 80 mg twice a day. Continue metolazone 1.25 mg daily. Follow-up next week and we can consider that we do need to be careful and go slowly as your blood pressure is low.  Take care, Dr. Jacinto Reap

## 2016-09-19 NOTE — Telephone Encounter (Signed)
Left message to call back  

## 2016-09-20 ENCOUNTER — Other Ambulatory Visit: Payer: Self-pay | Admitting: *Deleted

## 2016-09-20 DIAGNOSIS — M5032 Other cervical disc degeneration, mid-cervical region, unspecified level: Secondary | ICD-10-CM | POA: Diagnosis not present

## 2016-09-20 DIAGNOSIS — G894 Chronic pain syndrome: Secondary | ICD-10-CM | POA: Diagnosis not present

## 2016-09-20 DIAGNOSIS — M797 Fibromyalgia: Secondary | ICD-10-CM | POA: Diagnosis not present

## 2016-09-20 NOTE — Patient Outreach (Signed)
[facility]) Care Management  09/20/2016  Anaira Seay [facility] 1950/12/29 [id number]  Care coordination  THN CM reached Mrs Flahive to provide her with the information from CV office. THN CM was in the process of leaving a voice message when Mrs Persons picked up the telephone line.  Lattie Haw of [place] office had been called on 09/19/16 morning to report Mrs Chrystal concerns with swelling of abdomen, feet, difficult painful walking and concern with diuretic changes after CM called [employer] to request a return call to Mrs Medici about oxygen needs (jonathan regular DME staff had surgery, female would provide DME).   Today Lattie Haw Dr Debara Pickett RN called [facility] CM after having difficulty with attempts to reach Mrs Corriher to provide the recommendation of starting Lasix at 80 mg.  THN CM shared Mrs Scarpati had informed [facility] CM she had MD appointments on Thursday and Friday to attend may be the reason Lattie Haw had difficulty reaching Mrs Downum plus when Cm reach her hon 09/17/16 evening Mrs Brys had answered the phone almost as [facility] CM had finished leaving a voice message on her answering machine.  Lattie Haw stated she would continue to make attempts to reach pt but asked if [facility] CM was able to reach her to share the treatment recommendations for Lasix 80 mg. THN CM informed Lattie Haw CM would try if possible to reach Mrs Harmsen.   During the call to Mrs Farewell she talked at a fast pace. THN CM allowed her time to ventilate her feelings about [employer] but confirmed [facility] (s[facility]) did fulfill her oxygen needs.  Mrs Agar ventilated about not getting a call from the CV RN but near the end of the conversation stated she needed to check her "machine" and she received the recommendation to increase her diuretic and states she will return a call to the CV office or call the on call CV provider.     Plans  THN CM will see Mrs Luhman on next week for a [facility] CM home visit  [place]. Lavina Hamman, RN, BSN,  [facility] [phone]

## 2016-09-20 NOTE — Telephone Encounter (Signed)
Unable to reach the patient. She has not returned  the phone calls. Per the PCP note from her visit yesterday, they are addressing her increased weight and edema.   Hypervolemic in the setting of reduced diuretic doses Her weight is up 13 pounds from what appears to be her dry weight From chart review, it appears her dry weight is 198 pounds, she is 202 pounds on discharge, 210 pounds on 4/17 follow-up, 211 pounds today Increase Lasix to 80 mg twice daily Continue metolazone at 1.25 mg daily Potassium was stable 2 days ago, we will plan to recheck again in next visit We need to follow her closely and carefully titrate her medications as her blood pressure remains soft Follow-up next week and titrate as indicated  Returned a call to [employer] at [facility] to update her with the PCP's information. She stated that she will also try and get in touch but will see her on Monday.

## 2016-09-20 NOTE — Assessment & Plan Note (Signed)
Hypervolemic in the setting of reduced diuretic doses Her weight is up 13 pounds from what appears to be her dry weight From chart review, it appears her dry weight is 198 pounds, she is 202 pounds on discharge, 210 pounds on 4/17 follow-up, 211 pounds today Increase Lasix to 80 mg twice daily Continue metolazone at 1.25 mg daily Potassium was stable 2 days ago, we will plan to recheck again in next visit We need to follow her closely and carefully titrate her medications as her blood pressure remains soft Follow-up next week and titrate as indicated

## 2016-09-23 ENCOUNTER — Other Ambulatory Visit: Payer: Self-pay | Admitting: Pharmacist

## 2016-09-23 ENCOUNTER — Other Ambulatory Visit: Payer: Self-pay | Admitting: *Deleted

## 2016-09-23 ENCOUNTER — Encounter: Payer: Self-pay | Admitting: Pharmacist

## 2016-09-23 ENCOUNTER — Telehealth: Payer: Self-pay | Admitting: Internal Medicine

## 2016-09-23 NOTE — Progress Notes (Deleted)
Cardiology Office Note    Date:  09/23/2016   ID:  Denise, Freeman 1950/11/25, MRN [id number]  PCP:  Darci Needle, MD  Cardiologist: [name]   No chief complaint on file.   History of Present Illness:    Denise Freeman is a 66 y.o. female with past medical history of stress-induced cardiomyopathy, normal cors by cath in 2014, chronic combined systolic and diastolic CHF, permanent atrial fibrillation (on Coumadin), severe COPD (on 3L [place]),    She was recently admitted to [facility] from 4/6 - 09/14/2016 for RUQ pain. Cardiology was consulted for CHF as a repeat echocardiogram showed a reduced EF of 30-35% with HK of the anteroseptal and apical myocardium (previous EF of 45-50% by echo in 05/2014). A nuclear stress test was obtained to evaluate the etiology of her symptoms and showed a fixed septal defect with no reversible ischemia. Her cardiomyopathy was thought to be tachycardia-induced and therefore medical management was recommended. She ultimately went for a laparoscopic cholecystectomy on 09/13/2016 in the setting of acute cholecystitis with an uncomplicated post-surgical course.    Was seen by PCP on 4/19 and reported worsening dyspnea on exertion and lower extremity edema. Weight was up to 211 lbs, from 202 lbs at time of recent hospital discharge. Prior to her admission, she had been on Lasix 80mg  BID and Metolazone 2.5mg  daily. She was only resumed on PO Lasix 40mg  BID at time of discharge due to poor oral intake. At the time of her appointment, Lasix was increased to 80mg  BID and she was continued on Metolazone 1.25mg  daily.    Past Medical History:  Diagnosis Date  . Anemia   . Anxiety   . Asthma   . Atrial fibrillation ([place])   . Bipolar 1 disorder ([place])   . 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 CHF (congestive heart failure) ([place])   . Chronic bronchitis ([place])   . Colon polyps   . COPD (chronic obstructive pulmonary disease) ([place])   .  Depression   . Family history of adverse reaction to anesthesia    Uncle was positive for malignant hyperthermia; patient had testing done and was negative.  . Fibromyalgia   . GERD (gastroesophageal reflux disease)   . Gout   . High cholesterol   . History of blood transfusion 06/2015   "bleeding from my rectum"  . History of hiatal hernia   . HOH (hard of hearing)   . Hx of colonic polyps 03/21/2016   3 small adenomas no recall - co-morbidities  . Neuropathy    Disc Back   . On home oxygen therapy    "3L; 24/7" (03/14/2016)  . OSA treated with BiPAP    uses biPAP, 10 (03/14/2016)  . Osteoarthritis   . Pneumonia   . Type II diabetes mellitus ([place])     Past Surgical History:  Procedure Laterality Date  . APPENDECTOMY     "they busted"  . bladder stimulator     pt states, "it cannot be turned off; it's in my right hip; dead battery so it's not working anymore". (03/14/2016)  . BLADDER SUSPENSION     2003, 2006 and 2010  . CATARACT EXTRACTION W/PHACO Right 11/29/2014   Procedure: CATARACT EXTRACTION PHACO AND INTRAOCULAR LENS PLACEMENT (IOC);  Surgeon: Rutherford Guys, MD;  Location: [facility];  Service: Ophthalmology;  Laterality: Right;  CDE:3.81  . CATARACT EXTRACTION W/PHACO Left 12/13/2014   Procedure: CATARACT EXTRACTION PHACO AND  INTRAOCULAR LENS PLACEMENT (IOC);  Surgeon: Rutherford Guys, MD;  Location: [facility];  Service: Ophthalmology;  Laterality: Left;  CDE:6.59  . CERVICAL DISC SURGERY N/A 2009   4, 6, and 7 cervical disc replaced  . CHOLECYSTECTOMY N/A 09/13/2016   Procedure: LAPAROSCOPIC CHOLECYSTECTOMY;  Surgeon: Rolm Bookbinder, MD;  Location: [place];  Service: General;  Laterality: N/A;  . COLONOSCOPY WITH PROPOFOL N/A 03/15/2016   Procedure: COLONOSCOPY WITH PROPOFOL;  Surgeon: Gatha Mayer, MD;  Location: [place];  Service: Endoscopy;  Laterality: N/A;  . ESOPHAGOGASTRODUODENOSCOPY (EGD) WITH PROPOFOL N/A 03/15/2016   Procedure: ESOPHAGOGASTRODUODENOSCOPY (EGD)  WITH PROPOFOL;  Surgeon: Gatha Mayer, MD;  Location: [place];  Service: Endoscopy;  Laterality: N/A;  . HEEL SPUR SURGERY Bilateral   . HERNIA REPAIR    . I&D EXTREMITY Right 06/13/2015   Procedure: MINOR IRRIGATION AND DEBRIDEMENT EXTREMITY REMOVAL OF NAIL;  Surgeon: Daryll Brod, MD;  Location: [place];  Service: Orthopedics;  Laterality: Right;  . TUBAL LIGATION    . UMBILICAL HERNIA REPAIR     w/mesh    Current Medications: Outpatient Medications Prior to Visit  Medication Sig Dispense Refill  . albuterol (PROVENTIL) (2.5 MG/3ML) 0.083% nebulizer solution Take 2.5 mg by nebulization every 6 (six) hours as needed for wheezing or shortness of breath.    Marland Kitchen atorvastatin (LIPITOR) 40 MG tablet Take 1 tablet (40 mg total) by mouth daily. 90 tablet 3  . cetirizine (ZYRTEC) 10 MG tablet Take 10 mg by mouth at bedtime.    . Cholecalciferol (VITAMIN D3) 400 units CAPS Take 800 Units by mouth daily. (Patient taking differently: Take 400 Units by mouth 2 (two) times daily. ) 180 capsule 3  . docusate sodium (COLACE) 100 MG capsule Take 100 mg by mouth daily.    Marland Kitchen escitalopram (LEXAPRO) 10 MG tablet Take 10 mg by mouth at bedtime.     . fenofibrate 54 MG tablet Take 1 tablet (54 mg total) by mouth daily. 90 tablet 3  . ferrous sulfate 324 (65 Fe) MG TBEC Take 1 tablet by mouth daily.    . fluticasone (FLONASE) 50 MCG/ACT nasal spray Place 1 spray into both nostrils daily as needed for allergies or rhinitis. 32 g 6  . furosemide (LASIX) 40 MG tablet Take 1 tablet (40 mg total) by mouth 2 (two) times daily. (Patient taking differently: Take 80 mg by mouth 2 (two) times daily. ) 180 tablet 2  . Insulin Glargine (BASAGLAR KWIKPEN) 100 UNIT/ML SOPN Inject 25 units into the skin daily (Patient taking differently: Inject 40 Units into the skin at bedtime. Inject 25 units into the skin daily) 25 pen 0  . insulin lispro (HUMALOG KWIKPEN) 100 UNIT/ML KiwkPen CBG 70-120: 0 units; 121-150:  1 U; 151-200: 2 U; 201-250: 3 U; 251-300: 5 U; 301-350: 7 U; 351-400: 9 U 35 pen 3  . ipratropium (ATROVENT) 0.02 % nebulizer solution Take 0.5 mg by nebulization every 6 (six) hours as needed for wheezing or shortness of breath.    . metFORMIN (GLUCOPHAGE-XR) 500 MG 24 hr tablet Take 1 tablet (500 mg total) by mouth 2 (two) times daily. 180 tablet 2  . metolazone (ZAROXOLYN) 2.5 MG tablet Take 1.25 mg by mouth daily.    . metoprolol succinate (TOPROL-XL) 25 MG 24 hr tablet Take 1 tablet (25 mg total) by mouth 2 (two) times daily. 180 tablet 0  . mirtazapine (REMERON) 15 MG tablet Take 15 mg by mouth at bedtime.    Marland Kitchen  modafinil (PROVIGIL) 200 MG tablet Take 1 tablet (200 mg total) by mouth daily. 90 tablet 1  . montelukast (SINGULAIR) 10 MG tablet Take 1 tablet (10 mg total) by mouth at bedtime. 90 tablet 1  . nitroGLYCERIN (NITROSTAT) 0.4 MG SL tablet Place 1 tablet (0.4 mg total) under the tongue every 5 (five) minutes as needed for chest pain. 25 tablet 3  . nystatin (NYSTATIN) powder Apply topically 2 (two) times daily. Apply under breasts 15 g 3  . ondansetron (ZOFRAN-ODT) 4 MG disintegrating tablet Take 1 tablet (4 mg total) by mouth 2 (two) times daily as needed for nausea or vomiting. 30 tablet 1  . oxyCODONE-acetaminophen (PERCOCET/ROXICET) 5-325 MG tablet Take 1 tablet by mouth every 8 (eight) hours as needed for severe pain.    . OXYGEN Inhale 3 L into the lungs continuous. 3 liters 24/7    . pantoprazole (PROTONIX) 20 MG tablet Take 1 tablet (20 mg total) by mouth 2 (two) times daily before a meal. 180 tablet 1  . PRESCRIPTION MEDICATION Inhale into the lungs See admin instructions. Use BIPAP every time laying down    . primidone (MYSOLINE) 50 MG tablet Take 1 tablet (50 mg total) by mouth at bedtime. 90 tablet 3  . Probiotic Product (PROBIOTIC PO) Take 1 tablet by mouth at bedtime.    Marland Kitchen QUEtiapine (SEROQUEL) 25 MG tablet Take 25 mg by mouth at bedtime.  0  . triamcinolone lotion  (KENALOG) 0.1 % Apply 1 application topically 3 (three) times daily. For up to 14 days and as needed 60 mL 1  . VITAMIN A PO Take 1 tablet by mouth daily.    Marland Kitchen warfarin (COUMADIN) 4 MG tablet Take 1 tablet daily except 1/2 tablet on Wednesdays and Saturdays (Patient taking differently: Take 2-4 mg by mouth daily at 6 PM. Take 1 tablet daily except 1/2 tablet on Wednesdays and Saturdays) 30 tablet 2   No facility-administered medications prior to visit.      Allergies:   Xarelto [rivaroxaban]; Ancef [cefazolin]; Levaquin [levofloxacin in d5w]; Lyrica [pregabalin]; Tamiflu [oseltamivir phosphate]; Zoloft [sertraline hcl]; Augmentin [amoxicillin-pot clavulanate]; Ciprofloxacin; Haldol [haloperidol]; Nsaids; Penicillins; and Topamax [topiramate]   Social History   Social History  . Marital status: Married    Spouse name: N/A  . Number of children: 1  . Years of education: N/A   Occupational History  . house wife    Social History Main Topics  . Smoking status: Former Smoker    Packs/day: 2.00    Years: 14.00    Types: Cigarettes    Quit date: 04/12/1989  . Smokeless tobacco: Never Used  . Alcohol use No  . Drug use: No  . Sexual activity: No   Other Topics Concern  . Not on file   Social History Narrative   Married   Disabled   1 child; 65 yrs   6 pregnancies   1 child        Family History:  The patient's ***family history includes Breast cancer in her mother; COPD in her mother and sister; Diabetes in her mother and sister; Heart disease in her father, maternal grandmother, mother, and sister; Hyperlipidemia in her father.   Review of Systems:   Please see the history of present illness.     General:  No chills, fever, night sweats or weight changes.  Cardiovascular:  No chest pain, dyspnea on exertion, edema, orthopnea, palpitations, paroxysmal nocturnal dyspnea. Dermatological: No rash, lesions/masses Respiratory: No cough, dyspnea Urologic: No  hematuria,  dysuria Abdominal:   No nausea, vomiting, diarrhea, bright red blood per rectum, melena, or hematemesis Neurologic:  No visual changes, wkns, changes in mental status. All other systems reviewed and are otherwise negative except as noted above.   Physical Exam:    VS:  There were no vitals taken for this visit.   General: Well developed, well nourished,female appearing in no acute distress. Head: Normocephalic, atraumatic, sclera non-icteric, no xanthomas, nares are without discharge.  Neck: No carotid bruits. JVD not elevated.  Lungs: Respirations regular and unlabored, without wheezes or rales.  Heart: ***Regular rate and rhythm. No S3 or S4.  No murmur, no rubs, or gallops appreciated. Abdomen: Soft, non-tender, non-distended with normoactive bowel sounds. No hepatomegaly. No rebound/guarding. No obvious abdominal masses. Msk:  Strength and tone appear normal for age. No joint deformities or effusions. Extremities: No clubbing or cyanosis. No edema.  Distal pedal pulses are 2+ bilaterally. Neuro: Alert and oriented X 3. Moves all extremities spontaneously. No focal deficits noted. Psych:  Responds to questions appropriately with a normal affect. Skin: No rashes or lesions noted  Wt Readings from Last 3 Encounters:  09/19/16 211 lb (95.7 kg)  09/17/16 210 lb (95.3 kg)  09/14/16 202 lb (91.6 kg)        Studies/Labs Reviewed:   EKG:  EKG is*** ordered today.  The ekg ordered today demonstrates ***  Recent Labs: 09/07/2016: Magnesium 1.8 09/11/2016: B Natriuretic Peptide 644.5 09/14/2016: ALT 205; Hemoglobin 10.0; Platelets 303 09/17/2016: BUN 22; Creatinine, Ser 0.99; Potassium 4.6; Sodium 142   Lipid Panel    Component Value Date/Time   CHOL 148 09/10/2016 1147   TRIG 150 (H) 09/10/2016 1147   HDL 24 (L) 09/10/2016 1147   CHOLHDL 6.2 09/10/2016 1147   VLDL 30 09/10/2016 1147   LDLCALC 94 09/10/2016 1147   LDLDIRECT 153.0 04/19/2016 0932    Additional studies/ records  that were reviewed today include:   Echocardiogram: 09/07/2016 Study Conclusions  - Left ventricle: The cavity size was normal. Wall thickness was   normal. Systolic function was moderately to severely reduced. The   estimated ejection fraction was in the range of 30% to 35%. There   is hypokinesis of the anteroseptal and apical myocardium. - Mitral valve: There was mild regurgitation. - Left atrium: The atrium was mildly dilated. - Right ventricle: The cavity size was mildly dilated. Systolic   function was mildly reduced. - Right atrium: The atrium was moderately dilated.  Impressions:  - Technically difficult; definity used; there appears to be   hypokinesis of the anteroseptal and apical walls with moderate to   severe LV dysfunction; mild MR; biatrial enlargement; mild RVE   with mildly reduced function.  NST: 09/10/2016  There was no ST segment deviation noted during stress.  No T wave inversion was noted during stress.  Defect 1: There is a medium defect of moderate severity.  This is a high risk study.  Nuclear stress EF: 26%.   Medium size, moderate intensity fixed septal bundle branch/PVC-related artifact. No reversible ischemia. LVEF 26% with global hypokinesis and septal dyskinesis. This is a high risk study, given severely depressed LVEF  Assessment:    No diagnosis found.   Plan:   In order of problems listed above:  1. ***    Medication Adjustments/Labs and Tests Ordered: Current medicines are reviewed at length with the patient today.  Concerns regarding medicines are outlined above.  Medication changes, Labs and Tests ordered today are listed  in the Patient Instructions below. There are no Patient Instructions on file for this visit.   Signed, Erma Heritage, PA-C  09/23/2016 5:53 PM    [facility] [place], [place], [place]  [id number] Phone: [phone]; Fax: [phone]  [address],  [place], [place] [address] Phone: [phone]

## 2016-09-23 NOTE — Patient Outreach (Signed)
Denise Freeman   09/23/2016  Denise Freeman [facility] 1951-02-12 [id number]  Denise Freeman is an 66 y.o. 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   Denise Freeman was referred to [place] community coordinator on 09/16/16 by [facility] hospital liaison [facility] Eliott Nine, for Transition of care services after her 09/14/16 hospital discharge for RUQ abdominal pain with s/p high risk laparoscopic cholecystectomy on 09/13/16 for acute cholecystitis without biliary dilation.  Calls were made to Denise Freeman on 09/17/16 and 09/18/16.  Successful contact with Denise Freeman was made on 09/18/16 to complete a transition of care assessment that lead to contact with her Cardiology provider for issues with CHF signs and symptoms related decreased doses of lasix and metolazone while hospitalized and her Oxygen DME provider on 09/19/16 for issues with oxygen supply delivery.  Denise Freeman preferred a home visit on 09/23/16 after various office visits to her pcp (changes with her potassium dosages) and other medical providers on 09/18/16, 09/19/16 and 09/20/16.    Subjective:  "My feet have gone down and I can move around now" "I get hot, nauseated and have to fan myself with my hands" plus "I drink orange juice, eat anything with sugar" when her cbgs are low Reports her cbg is low when 104 or lower "I take my insulin and drink water" when her cbgs are high  Objective:   BP 120/64 (BP Location: Left Arm, Patient Position: Sitting, Cuff Size: Large)   Pulse 78   Resp 20   Ht 1.499 m (_0 )   Wt 199 lb (90.3 kg)   SpO2 98%   BMI 40.19 kg/m   Review of Systems  Constitutional: Positive for malaise/fatigue.  Negative for chills, diaphoresis, fever and weight loss.       Chills related to anemia   HENT: Positive for hearing loss. Negative for congestion, ear discharge, ear pain, nosebleeds, sinus pain, sore throat and tinnitus.   Eyes: Negative.  Negative for blurred vision, double vision, photophobia, pain, discharge and redness.  Respiratory: Positive for shortness of breath. Negative for cough, hemoptysis, sputum production, wheezing and stridor.   Cardiovascular: Positive for leg swelling. Negative for chest pain, palpitations, orthopnea, claudication and PND.  Gastrointestinal: Positive for heartburn. Negative for abdominal pain, blood in stool, constipation, diarrhea, melena, nausea and vomiting.  Genitourinary: Positive for frequency and urgency. Negative for dysuria, flank pain and hematuria.  Musculoskeletal: Positive for joint pain. Negative for back pain, falls, myalgias and neck pain.  Skin: Negative.  Negative for itching and rash.  Neurological: Positive for tingling, weakness and headaches. Negative for dizziness, tremors, sensory change, speech change, focal weakness, seizures and loss of consciousness.       Neuropathy  Recently taken off Potassium  Endo/Heme/Allergies: Negative for environmental allergies and polydipsia. Bruises/bleeds easily.  Psychiatric/Behavioral: Negative for depression, hallucinations, memory loss, substance abuse and suicidal ideas. The patient is nervous/anxious. The patient does not have insomnia.     Physical Exam  Constitutional: She is oriented to person, place, and time. She appears well-developed and well-nourished.  HENT:  Head: Normocephalic and atraumatic.  Eyes: Conjunctivae and EOM are normal. Pupils are equal, round, and reactive to light.  Neck: Normal range of  motion. Neck supple.  Cardiovascular: Normal rate, regular rhythm, normal heart sounds and intact distal pulses.   Respiratory: Effort normal.  GI: Soft. Bowel sounds are normal.  There is tenderness.    Musculoskeletal: She exhibits edema.       Right ankle: She exhibits swelling.       Left ankle: She exhibits swelling.  Neurological: She is alert and oriented to person, place, and time.  Skin: Skin is warm and dry.  Psychiatric: She has a normal mood and affect. Her behavior is normal. Judgment and thought content normal.    Encounter Medications:   Outpatient Encounter Prescriptions as of 09/23/2016  Medication Sig Note  . albuterol (PROVENTIL) (2.5 MG/3ML) 0.083% nebulizer solution Take 2.5 mg by nebulization every 6 (six) hours as needed for wheezing or shortness of breath. 09/06/2016: Takes with atrovent nebs  . atorvastatin (LIPITOR) 40 MG tablet Take 1 tablet (40 mg total) by mouth daily.   . cetirizine (ZYRTEC) 10 MG tablet Take 10 mg by mouth at bedtime.   . Cholecalciferol (VITAMIN D3) 400 units CAPS Take 800 Units by mouth daily. (Patient taking differently: Take 400 Units by mouth 2 (two) times daily. )   . docusate sodium (COLACE) 100 MG capsule Take 100 mg by mouth daily.   Marland Kitchen escitalopram (LEXAPRO) 10 MG tablet Take 10 mg by mouth at bedtime.    . fenofibrate 54 MG tablet Take 1 tablet (54 mg total) by mouth daily.   . ferrous sulfate 324 (65 Fe) MG TBEC Take 1 tablet by mouth daily.   . fluticasone (FLONASE) 50 MCG/ACT nasal spray Place 1 spray into both nostrils daily as needed for allergies or rhinitis.   . furosemide (LASIX) 40 MG tablet Take 1 tablet (40 mg total) by mouth 2 (two) times daily. (Patient taking differently: Take 80 mg by mouth 2 (two) times daily. ) 09/23/2016: Patient said she is taking 2 tablets in the morning and 2 in the evening  . Insulin Glargine (BASAGLAR KWIKPEN) 100 UNIT/ML SOPN Inject 25 units into the skin daily (Patient taking differently: Inject 40 Units into the skin at bedtime. Inject 25 units into the skin daily)   . insulin lispro (HUMALOG KWIKPEN) 100 UNIT/ML KiwkPen CBG 70-120: 0 units; 121-150: 1 U; 151-200: 2 U;  201-250: 3 U; 251-300: 5 U; 301-350: 7 U; 351-400: 9 U 09/23/2016: Patient said she is injecting 24 units in the AM, 28 units at lunch and 28 units at bedtime (uses sliding scale when blood sugar is very high)  . ipratropium (ATROVENT) 0.02 % nebulizer solution Take 0.5 mg by nebulization every 6 (six) hours as needed for wheezing or shortness of breath. 09/06/2016: Takes along with albuterol    . metFORMIN (GLUCOPHAGE-XR) 500 MG 24 hr tablet Take 1 tablet (500 mg total) by mouth 2 (two) times daily.   . metolazone (ZAROXOLYN) 2.5 MG tablet Take 1.25 mg by mouth daily. 09/23/2016: Restarted by Dr. Brita Romp 09/19/16  . metoprolol succinate (TOPROL-XL) 25 MG 24 hr tablet Take 1 tablet (25 mg total) by mouth 2 (two) times daily.   . mirtazapine (REMERON) 15 MG tablet Take 15 mg by mouth at bedtime.   . modafinil (PROVIGIL) 200 MG tablet Take 1 tablet (200 mg total) by mouth daily.   . montelukast (SINGULAIR) 10 MG tablet Take 1 tablet (10 mg total) by mouth at bedtime.   . nitroGLYCERIN (NITROSTAT) 0.4 MG SL tablet Place 1 tablet (0.4 mg total) under the tongue every 5 (  five) minutes as needed for chest pain.   Marland Kitchen nystatin (NYSTATIN) powder Apply topically 2 (two) times daily. Apply under breasts   . ondansetron (ZOFRAN-ODT) 4 MG disintegrating tablet Take 1 tablet (4 mg total) by mouth 2 (two) times daily as needed for nausea or vomiting.   Marland Kitchen oxyCODONE-acetaminophen (PERCOCET/ROXICET) 5-325 MG tablet Take 1 tablet by mouth every 8 (eight) hours as needed for severe pain.   . OXYGEN Inhale 3 L into the lungs continuous. 3 liters 24/7   . pantoprazole (PROTONIX) 20 MG tablet Take 1 tablet (20 mg total) by mouth 2 (two) times daily before a meal.   . PRESCRIPTION MEDICATION Inhale into the lungs See admin instructions. Use BIPAP every time laying down   . primidone (MYSOLINE) 50 MG tablet Take 1 tablet (50 mg total) by mouth at bedtime.   . Probiotic Product (PROBIOTIC PO) Take 1 tablet by mouth at bedtime.     Marland Kitchen QUEtiapine (SEROQUEL) 25 MG tablet Take 25 mg by mouth at bedtime.   . triamcinolone lotion (KENALOG) 0.1 % Apply 1 application topically 3 (three) times daily. For up to 14 days and as needed 09/23/2016: PRN Only  . VITAMIN A PO Take 1 tablet by mouth daily.   Marland Kitchen warfarin (COUMADIN) 4 MG tablet Take 1 tablet daily except 1/2 tablet on Wednesdays and Saturdays (Patient taking differently: Take 2-4 mg by mouth daily at 6 PM. Take 1 tablet daily except 1/2 tablet on Wednesdays and Saturdays)   . [DISCONTINUED] VITAMIN E PO Take 1 tablet by mouth daily.    No facility-administered encounter medications on file as of 09/23/2016.     Functional Status:   In your present state of health, do you have any difficulty performing the following activities: 09/23/2016 09/07/2016  Hearing? Y N  Vision? Y N  Difficulty concentrating or making decisions? - N  Walking or climbing stairs? Y N  Dressing or bathing? - N  Doing errands, shopping? Y N  Some recent data might be hidden    Fall/Depression Screening:    PHQ 2/9 Scores 09/06/2016 07/18/2016 06/07/2016 05/02/2016 04/19/2016  PHQ - 2 Score 0 0 0 1 0  PHQ- 9 Score - - - 5 -    Assessment:   THN CM met with Denise & Denise Freeman who greeted [facility] CM in their kitchen.  Denise Freeman was noted to complete toileting and transferring to her electric scooter activities independently.   Denise Freeman is hard of hearing and has visual issues (reported visual loss/blindness in both eyes). Denise Freeman speaks at a fast pace. [facility] pharmacist, Abbey Chatters joined the [place] and [facility] CM to review Denise Freeman allergies and medications. Please refer to pharmacist patient outreach note.  Denise Freeman was noted with >15 different prescriptions (polypharmacy), various bottles of medications in various colors and shapes but the same dosage and found not to be in a coverage gap (donut hole) at this time.    Denise Freeman confirms she is from [place], has been in [place] for "three years" and has a step brother  who lives locally but is not supportive.  Mr Uhlir (retired) is the primary care giver. Denise Roszak was noted during the home visit to keep Mr Durell very busy with various errands and aid to help her find items for her. Near the end of the home visit [facility] CM encouraged Mr Neuzil to rest when noted he was wheezing.   Mr & Denise Wilmot does not have acces to  Internet to set up a pharmacy benefit mail ordering account at this time. Internet access was discussed during the pharmacy assessment and use at a [employer] was encouraged.  Acute Medical Conditions (oxygen DME supplies, Lower extremity edema, Surgical incisions):  Denise Bechler has decided to continue DME oxygen services with [employer] (sister company to [place]) She has received her portable oxygen tanks and long extension oxygen tubing that reaches from one end of her home to the other (as noted by [facility] CM during home visit).  Edema of lower extremities has decreased with use of increase of lasix dosages (refer to complaints made on 09/18/16).  Her weight today is 199 lbs compared to the 09/19/16 weight of 211 lbs. Her feet with minimal edema and she denies pain of feet and less sob.  Surgical incisions with signs of healing The lower midline abdominal incision (the largest site) was noted with redness, no drainage, no open area and tender to touch.  This incision site has been kept clean and covered with a Band-Aid as THN CM encouraged Denise Ellner to on 09/20/16 when she voiced concern that she had been having abdominal swelling also and had been afraid the site would open up. Denise Flannery reports decrease in abdominal swelling.  Bandaid re applied after site assessed during the home visit.  Denise Colledge has easily bruised skin and various bruises from iv sites during her hospitalization.  Chronic Medical Conditions (COPD, DM, CHF, Atrial fibrillation, Mental health) K[facility] lists Denise wt at 211 lbs, ht 59 inches and BMI as 42.62 on 09/19/16. Wt is now 199 lbs. Denise Freeman is  managing her atrial fibrillation, DM, CHF and COPD with medications, a poor diet and minimal activity. Denise Freeman is followed by a local endocrinologist, Dr Philemon Kingdom, and has an upcoming appointment on Oct 18 2016 at 1000  various cbg are listed as      09/05/16 348 at 0751 Denise Freeman states this cbg value was before she was started on Metformin 500 mg    09/19/16 173 at 1930    09/20/16 127 at 0416    09/20/16 239 at 0740    09/20/16 159 at 2140    09/22/16 192 at 2040    09/23/16 162 at 0753    09/22/16 192 at 2040    7 day cbg average is 174, 14 day is 192, 30 day average is 244  Today's pulse is 78.  Denise Freeman does report issues with low BP and is educated on the importance of checking her BP prior to taking HTN medications to prevent further lowering BP and preventing fall risks.  THN began educating Denise Freeman on diet She informed THN CM she uses Morton reduces salt.  THN CM discussed label reading and showed Denise Freeman that her Morton's was still salt (sodium) and each serving per the label contained 290 mg of sodium (salt) and 350 mg of potassium.  Discussed use of other seasonings like Denise Freeman versus salt.   Denise Freeman is scheduled to see a podiatrist on 09/26/16 at 57 Denise Freeman has a Mental health appointment on 10/15/16 at 73  THN Cm will continue to assess, provide interventions and re evaluate Denise [facility] home care needs.  DME: ramp to home, electric scooter, glasses, oxygen, bedside commode, bipap, Embrace glucometer, has dentures but does not wear them, Left ear [place] hearing aid and wanting right ear [place] hearing aid ( appointment for service on 09/30/16), pulse oximeter,scale    Plan:  THN provided Denise Freeman with a weekly pill box and a THN calendar ( to use versus her wall calendar)  [facility] CM wrote all April and May 2018 appointments for Denise Vanvorst in the new [facility] calendar along with next [facility] home visit on 10/07/16  [facility] CM, [facility] pharmacist and Denise Thang spoke with Denise Freeman, Dr  [facility] RN when she called to answer questions about the message Denise Mayo left about metoprolol refill.  Pt with vision loss.  Her Metoprolol bottle is full but her Metolazone bottle needs to be refilled.  She does have enough in her Metolazone bottle to last until she is seen by the CV provider. The Metolazone was recently ordered by [facility] not the CV provider.  [place] CV provider can decide on 09/25/16 if Denise Moffit needs to continue Metolazone and reorder as needed.  THN CM educated Denise Cassel on the importance of informing pcp and specialist office staff of need to have follow up appointments within 7-14 days after hospital discharges. THN discussed Denise Fadness need for a hospital follow up appointment versus a 10/30/16 appointment with Denise Freeman, Dr [facility] RN.  An earlier appointment was received with the CV NP, [name] for 0930 on 09/25/16 before her coumadin check appointment at [place] provided Denise Colebank with a POA, MOST, DNR package  Denise Coplen was sent [facility] successful outreach letter [place] letters was sent to Dr Ola Spurr and Dr Debara Pickett with routed [place] notes, care plan  [facility] CM Care Plan Problem One     Most Recent Value  Care Plan Problem One  CHF home Freeman deficit   Role Documenting the Problem One  Care Freeman [place] for Problem One  Active  THN Long Term Goal (31-90 days)  over the next 45 days the patient will be able to verbalize home care Freeman interventions for CHF  [facility] Long Term Goal Start Date  09/18/16  Interventions for Problem One Long Term Goal  Review CHF diagnosis, CHF zones, medications, diet and activity  THN CM Short Term Goal #1 (0-30 days)  over the next 7 days the patietn will receive her portable oxygen and tubing from [employer]  THN CM Short Term Goal #1 Start Date  09/18/16  [facility] CM Short Term Goal #1 Met Date  09/23/16  Interventions for Short Term Goal #1  contact [employer] to request service for DME  [facility] CM Short Term Goal #2 (0-30  days)  Over the next 7-14 days the patient will have decreased CHF symptoms by evidence of decreased lower extremity edema and decreased soc  THN CM Short Term Goal #2 Start Date  09/18/16  Interventions for Short Term Goal #2  Assessment of CHF symptoms, call to CV office to report abnormal symptoms and request medcation treatment and hospital folllow up appointments, monitor and evaluation treatment plan   THN CM Short Term Goal #3 (0-30 days)  over the next 30 days the patient will be seen by [facility] pharmacy staff to review pharmacy discrepancies and offer resolutions  [facility] CM Short Term Goal #3 Start Date  09/23/16  Interventions for Short Tern Goal #3  referral to [place], review of medications, offer new pill box, monitor and evaluation pharmacy interventions        Kimberly L. Lavina Hamman, RN, BSN, [place] [facility] [phone]

## 2016-09-23 NOTE — Telephone Encounter (Signed)
Returned the phone call to the patient. She requested an earlier appointment rather than her May 30th one with [name]. [name] from [facility], who was visiting the patient,  got on the line and stated that it was for a discharge follow up.  An appointment has been made for Wednesday April 23rd at 9:30 with [name], [name]. She also requested a refill of her metolazone 1.25 mg. I informed her that this medication was not on her medication list. She stated that it was prescribed by her PCP. [name] from [facility] stated that she had enough to get her through the appointment on Wednesday and then it can be addressed.

## 2016-09-23 NOTE — Patient Outreach (Signed)
[facility]) Care Management  [place]   09/23/2016  Denise Freeman [facility] 25-May-1951 [id number]  Subjective: Home visit completed in patient's home with [place] Nurse, Jackelyn Poling and the patient's husband/caregiver.  HIPAA identifiers were obtained.  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  Patient reported managing her medications by herself. She uses a very small pill box that cannot hold all of her medications.  Patient also stated she was having issues paying for her medications as some of them have a $50 copay. She worried if she was/is in the "donut hole" with Medicare Part D.  Objective:    Encounter Medications: Outpatient Encounter Prescriptions as of 09/23/2016  Medication Sig Note  . albuterol (PROVENTIL) (2.5 MG/3ML) 0.083% nebulizer solution Take 2.5 mg by nebulization every 6 (six) hours as needed for wheezing or shortness of breath. 09/06/2016: Takes with atrovent nebs  . atorvastatin (LIPITOR) 40 MG tablet Take 1 tablet (40 mg total) by mouth daily.   . beta carotene 25000 UNIT capsule Take 25,000 Units by mouth daily.   . cetirizine (ZYRTEC) 10 MG tablet Take 10 mg by mouth at bedtime.   . Cholecalciferol (VITAMIN D3) 400 units CAPS Take 800 Units by mouth daily. (Patient taking differently: Take 400 Units by mouth 2 (two) times daily. )   . docusate sodium (COLACE) 100 MG capsule Take 100 mg by mouth daily.   Marland Kitchen escitalopram (LEXAPRO) 10 MG tablet Take 10 mg by mouth at bedtime.    . fenofibrate 54 MG tablet Take 1 tablet (54 mg total) by mouth daily.   . ferrous sulfate 324 (65 Fe) MG TBEC Take 1 tablet by mouth daily.   . fluticasone (FLONASE) 50 MCG/ACT nasal spray Place 1 spray into both nostrils daily as needed for allergies or rhinitis.    . furosemide (LASIX) 40 MG tablet Take 1 tablet (40 mg total) by mouth 2 (two) times daily. (Patient taking differently: Take 80 mg by mouth 2 (two) times daily. ) 09/23/2016: Patient said she is taking 2 tablets in the morning and 2 in the evening  . Insulin Glargine (BASAGLAR KWIKPEN) 100 UNIT/ML SOPN Inject 25 units into the skin daily (Patient taking differently: Inject 40 Units into the skin at bedtime. Inject 25 units into the skin daily)   . insulin lispro (HUMALOG KWIKPEN) 100 UNIT/ML KiwkPen CBG 70-120: 0 units; 121-150: 1 U; 151-200: 2 U; 201-250: 3 U; 251-300: 5 U; 301-350: 7 U; 351-400: 9 U 09/23/2016: Patient said she is injecting 24 units in the AM, 28 units at lunch and 28 units at bedtime (uses sliding scale when blood sugar is very high)  . ipratropium (ATROVENT) 0.02 % nebulizer solution Take 0.5 mg by nebulization every 6 (six) hours as needed for wheezing or shortness of breath. 09/06/2016: Takes along with albuterol    . metFORMIN (GLUCOPHAGE-XR) 500 MG 24 hr tablet Take 1 tablet (500 mg total) by mouth 2 (two) times daily.   . metolazone (ZAROXOLYN) 2.5 MG tablet Take 1.25 mg by mouth daily. 09/23/2016: Restarted by Dr. Brita Romp 09/19/16  . metoprolol succinate (TOPROL-XL) 25 MG 24 hr tablet Take 1 tablet (25 mg total) by mouth 2 (two) times daily.   . mirtazapine (REMERON) 15 MG tablet Take 15 mg by mouth at bedtime.   . modafinil (PROVIGIL)  200 MG tablet Take 1 tablet (200 mg total) by mouth daily.   . montelukast (SINGULAIR) 10 MG tablet Take 1 tablet (10 mg total) by mouth at bedtime.   . nitroGLYCERIN (NITROSTAT) 0.4 MG SL tablet Place 1 tablet (0.4 mg total) under the tongue every 5 (five) minutes as needed for chest pain.   Marland Kitchen nystatin (NYSTATIN) powder Apply topically 2 (two) times daily. Apply under breasts   . ondansetron (ZOFRAN-ODT) 4 MG disintegrating tablet Take 1 tablet (4 mg total) by mouth 2 (two) times daily as needed for nausea or vomiting.   Marland Kitchen  oxyCODONE-acetaminophen (PERCOCET/ROXICET) 5-325 MG tablet Take 1 tablet by mouth every 8 (eight) hours as needed for severe pain.   . OXYGEN Inhale 3 L into the lungs continuous. 3 liters 24/7   . pantoprazole (PROTONIX) 20 MG tablet Take 1 tablet (20 mg total) by mouth 2 (two) times daily before a meal.   . PRESCRIPTION MEDICATION Inhale into the lungs See admin instructions. Use BIPAP every time laying down   . primidone (MYSOLINE) 50 MG tablet Take 1 tablet (50 mg total) by mouth at bedtime.   . Probiotic Product (PROBIOTIC PO) Take 1 tablet by mouth at bedtime.   Marland Kitchen QUEtiapine (SEROQUEL) 25 MG tablet Take 25 mg by mouth at bedtime.   . triamcinolone lotion (KENALOG) 0.1 % Apply 1 application topically 3 (three) times daily. For up to 14 days and as needed 09/23/2016: PRN Only  . warfarin (COUMADIN) 4 MG tablet Take 1 tablet daily except 1/2 tablet on Wednesdays and Saturdays (Patient taking differently: Take 2-4 mg by mouth daily at 6 PM. Take 1 tablet daily except 1/2 tablet on Wednesdays and Saturdays)   . [DISCONTINUED] VITAMIN A PO Take 1 tablet by mouth daily.   . [DISCONTINUED] VITAMIN E PO Take 1 tablet by mouth daily.    No facility-administered encounter medications on file as of 09/23/2016.     Functional Status: In your present state of health, do you have any difficulty performing the following activities: 09/23/2016 09/07/2016  Hearing? Y N  Vision? Y N  Difficulty concentrating or making decisions? - N  Walking or climbing stairs? Y N  Dressing or bathing? - N  Doing errands, shopping? Y N  Some recent data might be hidden    Fall/Depression Screening: Fall Risk  09/06/2016 07/18/2016 06/07/2016  Falls in the past year? No No -  Number falls in past yr: - - 2 or more  Injury with Fall? - - -  Risk Factor Category  - - -  Risk for fall due to : - - Impaired balance/gait  Follow up - - -   PHQ 2/9 Scores 09/06/2016 07/18/2016 06/07/2016 05/02/2016 04/19/2016  PHQ - 2 Score 0 0 0  1 0  PHQ- 9 Score - - - 5 -      Assessment: [name] medications were reviewed with her medications bottles in the home.   Drugs sorted by system:  Neurologic/Psychologic: Escitalopram Modafinil Mirtazapine Primidone Quetiapine (not on med list)  Cardiovascular: Atorvastatin Fenofibrate Furosemide Metolazone Metoprolol SR Nitroglycerin Warfarin   Pulmonary/Allergy: Albuterol Nebulizer Solution Cetirizine Fluticasone Ipratropium Nebulizer solution Montelukast Oxygen/Helium  Gastrointestinal: Pantoprazole Docusate Ondansetron Dispersible Probiotic  Endocrine: Cholecalciferol (Vitamin D) Metformin Basaglar Humalog   Topical: Triamcinolone lotion Nystatin Powder  Pain: Oxycodone/APAP  Vitamins/Minerals: Beta Carotene Ferrous Sulfate  Medication Review Findings:  Adherence [name] pill box was reviewed.  Several medications were not in the pill box and the box was  too small to hold all of her tablets.  In addition, the patient had several different manufacturers of a medication mixed in the same bottle.  (Sometimes of varying strengths ie pantoprazole 20 & 40 as well as atorvastatin 20 & 40).  Omitted medication: Quetiapine-not on the patient's active medication list but was filled 07/11/16 and 09/20/16.  Patient reported taking Quetiapine 25mg  1 tablet at bedtime as prescribed by Dr. Bernita Raisin.  (Quetiapine was added to the patient's active medication list).  Dose discrepancies: Furosemide 40mg  dose was listed as 1 tablet twice daily in the patient's chart. Patient report and chart review of her most recent office visit revealed the furosemide dose was increased to 2 tablets twice daily. (Dose was changed on the patient's med list).  Metolazone 2.5mg - was restarted during 09/20/16 PCP office visit due to swelling.  (Metolazone was not on the patient's medication list..  It was added back to the list per patient report and chart  review.)  Potential Drug Interactions/Adverse Effect: Quetiapine-may cause QT prolongation alone and when used in combination with escitalopram and ondansetron.  Medication Assistance: Patient has a stand-alone Medicare Part D plan with [employer].  In addition, she also has Medicare A & B as well as supplemental coverage through [employer].  [employer] was called.  The representative at [place] said the patient has a special plan with wrap around coverage for state of [place] [employer].  As such, patient does not enter the "donut hole" or coverage gap.    In addition, once she pays an out of pocket total of $1500, medications are covered at 100%.  The representative could not give a TROOP (true-out-of-pocket) spend amount while we were on the phone but he put an EOB (estimation of benefits) packet in the mail for the patient today.  Initial review of the patient's financials showed she most likely does not qualify for extra help.    Obtaining insulin through patient assistance requires knowing the TROOP (as most companies require patient's to spend a percentage of their yearly income on medications or spending at least $1,000)  Getting the EOB information will help determine eligibility.  In addition, if the patient has spent close to $1500, she should qualify to get her medications at no charge from her insurance per the way I understood the [place] representative today.    Plan/Interventions:  1.  Patient was given a new, larger pill box  2.  [name] pill box was filled for 1 week and she was educated on how to fill it in the future.  3.  [name] medications were organized (old medications the patient is no longer taken were separated for disposal)  4.  Route note to PCP.  5.  Follow up with the patient in 1 week about the EOB letter.

## 2016-09-23 NOTE — Telephone Encounter (Signed)
Patient calling states that she was instructed to cut her metoprolol medication in high but states that she is having trouble cutting pills in half. Please call to discuss, thanks.

## 2016-09-24 ENCOUNTER — Encounter: Payer: Self-pay | Admitting: *Deleted

## 2016-09-25 ENCOUNTER — Ambulatory Visit: Payer: Medicare Other | Admitting: Student

## 2016-09-25 ENCOUNTER — Ambulatory Visit (INDEPENDENT_AMBULATORY_CARE_PROVIDER_SITE_OTHER): Payer: Medicare Other | Admitting: *Deleted

## 2016-09-25 DIAGNOSIS — I480 Paroxysmal atrial fibrillation: Secondary | ICD-10-CM

## 2016-09-25 LAB — POCT INR: INR: 5.8

## 2016-09-25 NOTE — Progress Notes (Signed)
   Subjective:   Patient ID: Denise Freeman    DOB: Jul 07, 1950, 66 y.o. female   MRN: [id number]  CC: "diabetes"  HPI: Denise Freeman is a 66 y.o. female who presents to clinic today diebetes. Problems discussed today are as follows:  Diabetes: Patient states she has been checking her blood sugars 4 times per day. She checks fasting and after breakfast and dinner and prior to bedtime. She did not bring her glucometer today. Sugar levels run in the 120s regularly with high 175 and low 98. She also takes metformin. Patient says home health nursing is coming to her place to help with polypharmacy.  ROS: complete ROS performed, see HPI for pertinent ROS.  [place]: IDDM, CKDIII, Afib on coumadin, HFpEF,COPD with asthmaon 3L O2, Bipolar, OSA,Vit D deficiency, HLD. Smoking status reviewed. Medications reviewed.  Objective:   BP 100/69 (BP Location: Left Arm, Patient Position: Sitting, Cuff Size: Normal)   Pulse 74   Temp 98.2 F (36.8 C) (Oral)   Ht 4\' 11"  (1.499 m)   Wt 192 lb 9.6 oz (87.4 kg)   SpO2 98%   BMI 38.90 kg/m  Vitals and nursing note reviewed.  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 A1c at 8.1 today. Given patient's severe comorbidities and risk for hypoglycemia due to polypharmacy, I am okay with this A1c. Patient taking sugar levels regularly stating average in 120s. Does  not seem to have lows or highs according to patient however she failed to bring her glucometer. Patient is to see home health RN on 5/7 polypharmacy.  --Continue metformin-X are 500 mg twice a day, basiglar 40 units at bedtime, and Humalog 24, 28, 20 --Recheck A1c in 3 months --Refilled atorvastatin 40 mg daily --RTC in 3 months  Orders Placed This Encounter  Procedures  . POCT glycosylated hemoglobin (Hb A1C)   Meds ordered this encounter  Medications  . atorvastatin (LIPITOR) 40 MG tablet    Sig: Take 1 tablet (40 mg total) by mouth daily.    Dispense:  90 tablet    Refill:  [place], [place], PGY-1 09/27/2016 5:06 PM

## 2016-09-26 ENCOUNTER — Encounter: Payer: Self-pay | Admitting: *Deleted

## 2016-09-26 DIAGNOSIS — I739 Peripheral vascular disease, unspecified: Secondary | ICD-10-CM | POA: Diagnosis not present

## 2016-09-26 DIAGNOSIS — E1151 Type 2 diabetes mellitus with diabetic peripheral angiopathy without gangrene: Secondary | ICD-10-CM | POA: Diagnosis not present

## 2016-09-26 DIAGNOSIS — L84 Corns and callosities: Secondary | ICD-10-CM | POA: Diagnosis not present

## 2016-09-26 DIAGNOSIS — L603 Nail dystrophy: Secondary | ICD-10-CM | POA: Diagnosis not present

## 2016-09-27 ENCOUNTER — Ambulatory Visit (INDEPENDENT_AMBULATORY_CARE_PROVIDER_SITE_OTHER): Payer: Medicare Other | Admitting: Family Medicine

## 2016-09-27 ENCOUNTER — Encounter: Payer: Self-pay | Admitting: Family Medicine

## 2016-09-27 VITALS — BP 100/69 | HR 74 | Temp 98.2°F | Ht 59.0 in | Wt 192.6 lb

## 2016-09-27 DIAGNOSIS — E785 Hyperlipidemia, unspecified: Secondary | ICD-10-CM | POA: Diagnosis not present

## 2016-09-27 DIAGNOSIS — Z794 Long term (current) use of insulin: Secondary | ICD-10-CM

## 2016-09-27 DIAGNOSIS — E11319 Type 2 diabetes mellitus with unspecified diabetic retinopathy without macular edema: Secondary | ICD-10-CM | POA: Diagnosis not present

## 2016-09-27 LAB — POCT GLYCOSYLATED HEMOGLOBIN (HGB A1C): HEMOGLOBIN A1C: 8.1

## 2016-09-27 MED ORDER — ATORVASTATIN CALCIUM 40 MG PO TABS: 40.0000 mg | ORAL_TABLET | Freq: Every day | ORAL | 3 refills | Status: DC

## 2016-09-27 NOTE — Patient Instructions (Signed)
Thank you for coming in to see [place] today. Please see below to review our plan for today's visit.  1. Please continue taking your diabetes medications as prescribed. It is important you monitor your sugar levels regularly as you are. Check your fasting level before you eat in the morning and roughly 2 hours after the meals. This will help [place] understand if we need to add additional coverage depending on how high your sugar level is. He seemed to understand when and how to take your insulin appropriately. 2. We will recheck your A1c in 3 months. 3. Return to clinic in 3 months.  Please call the clinic at [phone] if your symptoms worsen or you have any concerns. It was my pleasure to see you. -- Harriet [place], [place], PGY-1

## 2016-09-27 NOTE — Assessment & Plan Note (Addendum)
Chronic. A1c at 8.1 today. Given patient's severe comorbidities and risk for hypoglycemia due to polypharmacy, I am okay with this A1c. Patient taking sugar levels regularly stating average in 120s. Does not seem to have lows or highs according to patient however she failed to bring her glucometer. Patient is to see home health RN on 5/7 polypharmacy.  --Continue metformin-X are 500 mg twice a day, basiglar 40 units at bedtime, and Humalog 24, 28, 20 --Recheck A1c in 3 months --Refilled atorvastatin 40 mg daily --RTC in 3 months

## 2016-10-02 ENCOUNTER — Other Ambulatory Visit: Payer: Self-pay | Admitting: *Deleted

## 2016-10-02 ENCOUNTER — Encounter: Payer: Self-pay | Admitting: Nurse Practitioner

## 2016-10-02 ENCOUNTER — Ambulatory Visit (INDEPENDENT_AMBULATORY_CARE_PROVIDER_SITE_OTHER): Payer: Medicare Other | Admitting: *Deleted

## 2016-10-02 ENCOUNTER — Ambulatory Visit: Payer: Medicare Other | Admitting: Nurse Practitioner

## 2016-10-02 ENCOUNTER — Ambulatory Visit (INDEPENDENT_AMBULATORY_CARE_PROVIDER_SITE_OTHER): Payer: Medicare Other | Admitting: Nurse Practitioner

## 2016-10-02 VITALS — BP 124/72 | HR 70 | Ht 59.0 in | Wt 180.0 lb

## 2016-10-02 DIAGNOSIS — E7849 Other hyperlipidemia: Secondary | ICD-10-CM

## 2016-10-02 DIAGNOSIS — I428 Other cardiomyopathies: Secondary | ICD-10-CM | POA: Diagnosis not present

## 2016-10-02 DIAGNOSIS — I4891 Unspecified atrial fibrillation: Secondary | ICD-10-CM

## 2016-10-02 DIAGNOSIS — I482 Chronic atrial fibrillation: Secondary | ICD-10-CM

## 2016-10-02 DIAGNOSIS — N183 Chronic kidney disease, stage 3 unspecified: Secondary | ICD-10-CM

## 2016-10-02 DIAGNOSIS — I5022 Chronic systolic (congestive) heart failure: Secondary | ICD-10-CM | POA: Diagnosis not present

## 2016-10-02 DIAGNOSIS — Z79899 Other long term (current) drug therapy: Secondary | ICD-10-CM

## 2016-10-02 DIAGNOSIS — E784 Other hyperlipidemia: Secondary | ICD-10-CM | POA: Diagnosis not present

## 2016-10-02 DIAGNOSIS — I480 Paroxysmal atrial fibrillation: Secondary | ICD-10-CM | POA: Diagnosis not present

## 2016-10-02 DIAGNOSIS — I4821 Permanent atrial fibrillation: Secondary | ICD-10-CM

## 2016-10-02 DIAGNOSIS — I10 Essential (primary) hypertension: Secondary | ICD-10-CM | POA: Diagnosis not present

## 2016-10-02 LAB — BASIC METABOLIC PANEL
BUN: 85 mg/dL — ABNORMAL HIGH (ref 7–25)
CALCIUM: 9.4 mg/dL (ref 8.6–10.4)
CHLORIDE: 90 mmol/L — AB (ref 98–110)
CO2: 33 mmol/L — AB (ref 20–31)
Creat: 1.04 mg/dL — ABNORMAL HIGH (ref 0.50–0.99)
Glucose, Bld: 148 mg/dL — ABNORMAL HIGH (ref 65–99)
POTASSIUM: 3.6 mmol/L (ref 3.5–5.3)
Sodium: 139 mmol/L (ref 135–146)

## 2016-10-02 LAB — POCT INR: INR: 1.4

## 2016-10-02 MED ORDER — ISOSORB DINITRATE-HYDRALAZINE 20-37.5 MG PO TABS: 1.0000 | ORAL_TABLET | Freq: Three times a day (TID) | ORAL | 3 refills | Status: DC

## 2016-10-02 NOTE — Patient Outreach (Addendum)
[facility]) Care Management  10/02/2016  Denise Freeman [facility] 07/03/1950 [id number]   Care Coordination   [facility] CM received a call from Denise Freeman about a pharmacy form.  She was encouraged to slow down so CM could understand her concern.  She states she received a form but not one for her husband.  CM did not request a form from her pharmacy and inquired if she may be referring to [facility] pharmacist and she confirmed pharmacist is whom she needed CM informed her CM would have pharmacist return a call to her  Plan [facility] CM left [facility] pharmacist a voice message/email message to return a call to patient about a form she had not received [facility] CM to follow up with Denise Freeman next week  Joelene Millin L. Lavina Hamman, RN, BSN, [place] Care Management [phone]

## 2016-10-02 NOTE — Patient Instructions (Addendum)
Medication Instructions: Please start taking Isosorbide-hydralazine (Bidil) one tablet three times daily  Labwork: Please have the following lab drawn today: BMET   Follow-Up: Your physician recommends that you schedule a follow-up appointment in: One month with [name] or an APP   If you need a refill on your cardiac medications before your next appointment, please call your pharmacy.

## 2016-10-02 NOTE — Progress Notes (Signed)
Office Visit    Patient Name: [facility] Date of Encounter: 10/02/2016  Primary Care Provider:  Darci Needle, MD Primary Cardiologist:  C. Hilty, MD   Chief Complaint    66 year old female with a history of permanent atrial fibrillation, nonischemic cardiomyopathy, severe COPD on home O2, morbid obesity, diabetes, sleep apnea, hypertension, hyperlipidemia, and recent admission for abdominal pain and cholecystectomy, who presents for follow-up.  Past Medical History    Past Medical History:  Diagnosis Date  . Allergy   . Anemia   . Anxiety   . Asthma   . Atrial fibrillation ([place])   . Bipolar 1 disorder ([place])   . 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 Cholelithiasis    a. 09/2016 s/p Lap Chole.  . Chronic bronchitis ([place])   . Chronic combined systolic and diastolic congestive heart failure ([place])    a. 09/2016 Echo: EF 30-35%.  . Colon polyps   . COPD (chronic obstructive pulmonary disease) ([place])   . Depression   . Family history of adverse reaction to anesthesia    Uncle was positive for malignant hyperthermia; patient had testing done and was negative.  . Fibromyalgia   . GERD (gastroesophageal reflux disease)   . Gout   . High cholesterol   . History of blood transfusion 06/2015   "bleeding from my rectum"  . History of hiatal hernia   . HOH (hard of hearing)   . Hx of colonic polyps 03/21/2016   3 small adenomas no recall - co-morbidities  . Neuropathy    Disc Back   . NICM (nonischemic cardiomyopathy) ([place])    a. Previously worked up in [place], MD-->low EF with subsequent recovery.  Multiple caths (last ~ 2014 per pt report)--reportedly nl cors;  b. 09/2016 Echo: EF 30-35%, antsept/apical HK, mild MR, mildly dil LA, mod dil RA;  c. 09/2016 Lexi MV: EF 26%, glob HK, sept DK, med size, mod intensity fixed septal defect - BBB/PVC related artifact, no ischemia.  . On home oxygen therapy    "3L; 24/7" (03/14/2016)  . OSA treated with  BiPAP    uses biPAP, 10 (03/14/2016)  . Osteoarthritis   . Oxygen deficiency   . Pneumonia   . Type II diabetes mellitus ([place])    Past Surgical History:  Procedure Laterality Date  . APPENDECTOMY     "they busted"  . bladder stimulator     pt states, "it cannot be turned off; it's in my right hip; dead battery so it's not working anymore". (03/14/2016)  . BLADDER SUSPENSION     2003, 2006 and 2010  . CATARACT EXTRACTION W/PHACO Right 11/29/2014   Procedure: CATARACT EXTRACTION PHACO AND INTRAOCULAR LENS PLACEMENT (IOC);  Surgeon: Rutherford Guys, MD;  Location: [facility];  Service: Ophthalmology;  Laterality: Right;  CDE:3.81  . CATARACT EXTRACTION W/PHACO Left 12/13/2014   Procedure: CATARACT EXTRACTION PHACO AND INTRAOCULAR LENS PLACEMENT (IOC);  Surgeon: Rutherford Guys, MD;  Location: [facility];  Service: Ophthalmology;  Laterality: Left;  CDE:6.59  . CERVICAL DISC SURGERY N/A 2009   4, 6, and 7 cervical disc replaced  . CHOLECYSTECTOMY N/A 09/13/2016   Procedure: LAPAROSCOPIC CHOLECYSTECTOMY;  Surgeon: Rolm Bookbinder, MD;  Location: [place];  Service: General;  Laterality: N/A;  . COLONOSCOPY WITH PROPOFOL N/A 03/15/2016   Procedure: COLONOSCOPY WITH PROPOFOL;  Surgeon: Gatha Mayer, MD;  Location: [place];  Service: Endoscopy;  Laterality: N/A;  . ESOPHAGOGASTRODUODENOSCOPY (EGD) WITH PROPOFOL N/A  03/15/2016   Procedure: ESOPHAGOGASTRODUODENOSCOPY (EGD) WITH PROPOFOL;  Surgeon: Gatha Mayer, MD;  Location: [place];  Service: Endoscopy;  Laterality: N/A;  . HEEL SPUR SURGERY Bilateral   . HERNIA REPAIR    . I&D EXTREMITY Right 06/13/2015   Procedure: MINOR IRRIGATION AND DEBRIDEMENT EXTREMITY REMOVAL OF NAIL;  Surgeon: Daryll Brod, MD;  Location: [place];  Service: Orthopedics;  Laterality: Right;  . TUBAL LIGATION    . UMBILICAL HERNIA REPAIR     w/mesh    Allergies  Allergies  Allergen Reactions  . Xarelto [Rivaroxaban] Other (See Comments)    Internal  bleeding  . Ancef [Cefazolin] Nausea And Vomiting  . Levaquin [Levofloxacin In D5w] Other (See Comments)    "afib"  . Lyrica [Pregabalin] Hives  . Tamiflu [Oseltamivir Phosphate] Other (See Comments)    "water blisters"  . Zoloft [Sertraline Hcl] Other (See Comments)    Jaw problems, jittery  . Augmentin [Amoxicillin-Pot Clavulanate] Itching  . Ciprofloxacin Itching and Nausea And Vomiting  . Haldol [Haloperidol] Other (See Comments)    Restless leg  . Nsaids Diarrhea  . Penicillins Itching, Nausea And Vomiting and Rash    Has patient had a PCN reaction causing immediate rash, facial/tongue/throat swelling, SOB or lightheadedness with hypotension: Yes Has patient had a PCN reaction causing severe rash involving mucus membranes or skin necrosis: No Has patient had a PCN reaction that required hospitalization No Has patient had a PCN reaction occurring within the last 10 years: Yes If all of the above answers are "NO", then may proceed with Cephalosporin use.  . Topamax [Topiramate] Nausea Only    History of Present Illness    66 year old female with the above complex past medical history including nonischemic cardio myopathy that was present worked up in [place]. She had subsequent recovery of LV function and by report has had multiple cardiac catheterizations, the last of which in October 2014, demonstrating nonobstructive CAD. Other history includes hypertension, hyperlipidemia, obesity, diabetes, sleep apnea, and severe COPD on home O2. She also has permanent atrial fibrillation and for a period of time was not on oral anticoagulation though she is now back on warfarin. She was evaluated by Dr. Rayann Heman for consideration of a watchman device however, she was felt to be a poor candidate. She was recently admitted to [facility] with abdominal discomfort. She was found to have gallstones without cholecystitis. She was evaluated by surgery with recommendation for laparoscopic cholecystectomy.  She was seen by cardiology and underwent echo which showed reduced LV function with an EF of 30-35%. She then underwent stress testing which showed no evidence of ischemia. She was also evaluated by pulmonology and was felt to be high risk for respiratory failure and need for BiPAP post operatively. Patient went ahead with elective surgery and did well postoperatively.  She says that following discharge, she was not prescribed diuretics and she had significant weight gain and abdominal bloating. She resumed Lasix at 40 mg twice a day and also metolazone at 1.25 mg daily. With that, she has had improved output, reduced swelling, and a 30 pound weight loss over the past few weeks. She has not had any chest pain. She does have chronic dyspnea on exertion and does not really ambulate much. She mostly uses a scooter to get around. She has not had any PND, orthopnea, dizziness, syncope, edema, or early satiety. She is chest pain-free.     Home Medications    Prior to Admission medications  Medication Sig Start Date End Date Taking? Authorizing Provider  albuterol (PROVENTIL) (2.5 MG/3ML) 0.083% nebulizer solution Take 2.5 mg by nebulization every 6 (six) hours as needed for wheezing or shortness of breath.   Yes Historical Provider, MD  atorvastatin (LIPITOR) 40 MG tablet Take 1 tablet (40 mg total) by mouth daily. 09/27/16  Yes Pacheco Bing, DO  beta carotene 25000 UNIT capsule Take 25,000 Units by mouth daily.   Yes Historical Provider, MD  cetirizine (ZYRTEC) 10 MG tablet Take 10 mg by mouth at bedtime.   Yes Historical Provider, MD  cholecalciferol (VITAMIN D) 400 units TABS tablet Take 400 Units by mouth 2 (two) times daily.   Yes Historical Provider, MD  docusate sodium (COLACE) 100 MG capsule Take 100 mg by mouth daily.   Yes Historical Provider, MD  escitalopram (LEXAPRO) 10 MG tablet Take 10 mg by mouth at bedtime.    Yes Historical Provider, MD  fenofibrate 54 MG tablet Take 1 tablet (54 mg  total) by mouth daily. 06/04/16  Yes Philemon Kingdom, MD  ferrous sulfate 324 (65 Fe) MG TBEC Take 1 tablet by mouth daily.   Yes Historical Provider, MD  fluticasone (FLONASE) 50 MCG/ACT nasal spray Place 1 spray into both nostrils daily as needed for allergies or rhinitis. 06/09/16  Yes Hillary Corinda Gubler, MD  furosemide (LASIX) 40 MG tablet Take 80 mg by mouth 2 (two) times daily.   Yes Historical Provider, MD  Insulin Glargine (BASAGLAR KWIKPEN) 100 UNIT/ML SOPN Inject 25 units into the skin daily Patient taking differently: Inject 40 Units into the skin at bedtime. Inject 25 units into the skin daily 09/14/16  Yes Hillary Corinda Gubler, MD  insulin lispro (HUMALOG KWIKPEN) 100 UNIT/ML KiwkPen CBG 70-120: 0 units; 121-150: 1 U; 151-200: 2 U; 201-250: 3 U; 251-300: 5 U; 301-350: 7 U; 351-400: 9 U 09/14/16  Yes Hillary Corinda Gubler, MD  ipratropium (ATROVENT) 0.02 % nebulizer solution Take 0.5 mg by nebulization every 6 (six) hours as needed for wheezing or shortness of breath.   Yes Historical Provider, MD  metFORMIN (GLUCOPHAGE-XR) 500 MG 24 hr tablet Take 1 tablet (500 mg total) by mouth 2 (two) times daily. 08/16/16  Yes Philemon Kingdom, MD  metolazone (ZAROXOLYN) 2.5 MG tablet Take 1.25 mg by mouth daily.   Yes Virginia Crews, MD  metoprolol succinate (TOPROL-XL) 25 MG 24 hr tablet Take 1 tablet (25 mg total) by mouth 2 (two) times daily. 09/14/16  Yes Hillary Corinda Gubler, MD  mirtazapine (REMERON) 15 MG tablet Take 15 mg by mouth at bedtime.   Yes Historical Provider, MD  modafinil (PROVIGIL) 200 MG tablet Take 1 tablet (200 mg total) by mouth daily. 08/29/16  Yes Chesley Mires, MD  montelukast (SINGULAIR) 10 MG tablet Take 1 tablet (10 mg total) by mouth at bedtime. 06/10/16  Yes Chesley Mires, MD  nitroGLYCERIN (NITROSTAT) 0.4 MG SL tablet Place 1 tablet (0.4 mg total) under the tongue every 5 (five) minutes as needed for chest pain. 10/31/15  Yes Pixie Casino, MD  nystatin  (NYSTATIN) powder Apply topically 2 (two) times daily. Apply under breasts 06/09/16  Yes Hillary Corinda Gubler, MD  ondansetron (ZOFRAN-ODT) 4 MG disintegrating tablet Take 1 tablet (4 mg total) by mouth 2 (two) times daily as needed for nausea or vomiting. 06/09/16  Yes Hillary Corinda Gubler, MD  oxyCODONE-acetaminophen (PERCOCET/ROXICET) 5-325 MG tablet Take 1 tablet by mouth every 8 (eight) hours as needed for severe pain.   Yes  Historical Provider, MD  OXYGEN Inhale 3 L into the lungs continuous. 3 liters 24/7   Yes Historical Provider, MD  pantoprazole (PROTONIX) 20 MG tablet Take 1 tablet (20 mg total) by mouth 2 (two) times daily before a meal. 08/03/16  Yes Hillary Corinda Gubler, MD  PRESCRIPTION MEDICATION Inhale into the lungs See admin instructions. Use BIPAP every time laying down   Yes Historical Provider, MD  primidone (MYSOLINE) 50 MG tablet Take 1 tablet (50 mg total) by mouth at bedtime. 08/03/16  Yes Hillary Corinda Gubler, MD  Probiotic Product (PROBIOTIC PO) Take 1 tablet by mouth at bedtime.   Yes Historical Provider, MD  QUEtiapine (SEROQUEL) 25 MG tablet Take 25 mg by mouth at bedtime. 09/20/16  Yes Bernita Raisin, MD  triamcinolone lotion (KENALOG) 0.1 % Apply 1 application topically 3 (three) times daily. For up to 14 days and as needed 01/26/16  Yes Betty G Martinique, MD  warfarin (COUMADIN) 4 MG tablet Take 1 tablet daily except 1/2 tablet on Wednesdays and Saturdays Patient taking differently: Take 2-4 mg by mouth daily at 6 PM. Take 1 tablet daily except 1/2 tablet on Wednesdays and Saturdays 09/04/16  Yes Herminio Commons, MD  isosorbide-hydrALAZINE (BIDIL) 20-37.5 MG tablet Take 1 tablet by mouth 3 (three) times daily. 10/02/16   Rogelia Mire, NP    Review of Systems    As above, she has chronic dyspnea on exertion. She has had significant weight loss in the setting of diuretic therapy. She denies chest pain, palpitations, PND, orthopnea, dizziness, syncope, edema, or  early satiety.   All other systems reviewed and are otherwise negative except as noted above.  Physical Exam    VS:  BP 124/72   Pulse 70   Ht 4\' 11"  (1.499 m)   Wt 180 lb (81.6 kg)   BMI 36.36 kg/m  , BMI Body mass index is 36.36 kg/m. GEN: Well nourished, well developed, in no acute distress.  HEENT: normal.  Neck: Supple,Obese,  no JVD, carotid bruits, or masses. CardiacIrregularly irregular , no murmurs, rubs, or gallops. No clubbing, cyanosis, edema.  Radials/DP/PT 2+ and equal bilaterally.  Respiratory:  Respirations regular and unlaboreddiminished breath sounds bilaterally.GI: Soft, nontender, nondistended, BS + x 4. MS: no deformity or atrophy. Skin: warm and dry, no rash. Neuro:  Strength and sensation are intact. Psych: Normal affect.  Accessory Clinical Findings    Lab Results  Component Value Date   CREATININE 0.99 09/17/2016   BUN 22 09/17/2016   NA 142 09/17/2016   K 4.6 09/17/2016   CL 99 09/17/2016   CO2 30 (H) 09/17/2016   2D Echocardiogram 4.7.2018  Study Conclusions   - Left ventricle: The cavity size was normal. Wall thickness was   normal. Systolic function was moderately to severely reduced. The   estimated ejection fraction was in the range of 30% to 35%. There   is hypokinesis of the anteroseptal and apical myocardium. - Mitral valve: There was mild regurgitation. - Left atrium: The atrium was mildly dilated. - Right ventricle: The cavity size was mildly dilated. Systolic   function was mildly reduced. - Right atrium: The atrium was moderately dilated.   Impressions:   - Technically difficult; definity used; there appears to be   hypokinesis of the anteroseptal and apical walls with moderate to   severe LV dysfunction; mild MR; biatrial enlargement; mild RVE   with mildly reduced function. _____________  Carlton Adam Cardiolite 4.2018  Medium size, moderate intensity fixed septal  bundle branch/PVC-related artifact. No reversible ischemia.  LVEF 26% with global hypokinesis and septal dyskinesis. This is a high risk study, given severely depressed LVEF. _____________   Assessment & Plan    1.  Nonischemic cardiomyopathy/Chronic systolic CHF: Patient with prior history of nonischemic cardiomyopathy status post previous workup in [place] with catheterization in 2014 showing nonobstructive disease. By report, LV function improved previously, but now is documented to be reduced at 30-35% by recent echo. Stress testing was also undertaken during recent hospitalization and showed a fixed septal defect but no ischemia. Medical therapy was recommended. She is euvolemic on exam today. Ideas were somewhere She is on beta blocker therapy and has also resumed previous home doses of furosemide and metolazone. She has not had a basic metabolic panel since resumption of diuretics. In that setting, I am obtaining a basic metabolic panel today. Creatinine was elevated up to 2.07 during her admission in April. She is not currently on an ACE inhibitor or ARB. Given propensity toward renal insufficiency, I will add BiDil instead of ACEI/ARB for afterload reduction. Although her blood pressure looks okay in clinic today, she has readings with her that show that she runs consistently in the 150s to 160s at home.  2.  Essential HTN:  bp has been up @ home.  She has recordings with her today.  Adding bidil for afterload reduction in the setting of #1.  3.  HL:  ldl recently 94.  Cont statin therapy.  4.  COPD:  On home O2.  Stable.  5.  CKD II-III w/ recent AKI:  f/u bmet today since she has resumed home diuretics.  May need to adjust pending labs - i'm concerned she may be dry in setting of 30 lbs wt loss.  6.  Permanent Afib:  Rate controlled on  blocker.  Not felt to be a candidate for Watchman.  Cont coumadin - followed in [place] office.  7.  Dispo:  f/u labs today.  F/u in clinic in one month.  Murray Hodgkins, NP 10/02/2016, 12:48 PM

## 2016-10-03 ENCOUNTER — Other Ambulatory Visit: Payer: Self-pay | Admitting: Pharmacist

## 2016-10-03 NOTE — Patient Outreach (Signed)
[facility]) Care Management  10/03/2016  Florine Sprenkle [facility] 07-Apr-1951 [id number]   Patient was called regarding a pharmacy form per voicemail from [place].  Patient did not answer the phone. HIPAA compliant message left on her voicemail.  [name], PharmD, [place] Clinical Pharmacist [phone]   ADDENDUM  Patient called me back but I was on the phone with another patient. When I called her back, I had to leave a message.  Plan:   Call patient tomorrow about the pharmacy form.  [name], PharmD, [place] Clinical Pharmacist [phone]

## 2016-10-07 ENCOUNTER — Other Ambulatory Visit: Payer: Self-pay | Admitting: *Deleted

## 2016-10-07 ENCOUNTER — Other Ambulatory Visit: Payer: Self-pay | Admitting: Pharmacist

## 2016-10-07 NOTE — Patient Outreach (Signed)
[facility]) Care Management   10/07/2016  Denise Freeman [facility] Oct 29, 1950 [id number]  Denise Freeman is an 66 y.o. 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   Denise Freeman was referred to [place] community coordinator on 09/16/16 by [facility] hospital liaison [facility]), Eliott Nine, for Transition of care services after her 09/14/16 hospital discharge for RUQ abdominal pain with s/p high risk laparoscopic cholecystectomy on 09/13/16 for acute cholecystitis without biliary dilation.  Calls were made to Denise Freeman on 09/17/16 and 09/18/16.  Successful contact with Denise Freeman was made on 09/18/16 to complete a transition of care assessment that lead to contact with her Cardiology provider for issues with CHF signs and symptoms related decreased doses of lasix and metolazone while hospitalized and her Oxygen DME provider on 09/19/16 for issues with oxygen supply delivery.  Denise Freeman preferred a home visit on 09/23/16 after various office visits to her pcp (changes with her potassium dosages) and other medical providers on 09/18/16, 09/19/16 and 09/20/16.     Subjective: "I need to know if I have to go back to the surgeon.  I still have band aids on my stomach"   "my foot doctor had my feet done so well that they were tender for days" "I have done something to my meter's time but I have been taking blood sugars"   Objective:   BP 132/76 (BP Location: Right Arm, Patient Position: Sitting, Cuff Size: Large)   Pulse 83   Temp 98.7 F (37.1 C) (Oral)   Ht 1.499 m (_0 )   SpO2 98%  Talking a a fast pace during assessment Noted to go from one subject to another and having to be redirected but to the  subject a hand  Review of Systems  Constitutional: Negative for chills, diaphoresis, fever, malaise/fatigue and weight loss.  HENT: Negative for congestion, ear discharge, ear pain, hearing loss, nosebleeds, sinus pain, sore throat and tinnitus.   Eyes: Negative for blurred vision, double vision, photophobia, pain and discharge.  Respiratory: Negative for cough, hemoptysis, sputum production, shortness of breath, wheezing and stridor.   Cardiovascular: Negative for chest pain, palpitations, orthopnea, claudication, leg swelling and PND.  Gastrointestinal: Negative for abdominal pain, blood in stool, constipation, diarrhea, heartburn, melena, nausea and vomiting.  Genitourinary: Negative for dysuria, flank pain, frequency, hematuria and urgency.  Musculoskeletal: Positive for joint pain. Negative for back pain, falls, myalgias and neck pain.  Skin: Negative for itching and rash.  Neurological: Positive for weakness. Negative for dizziness, tingling, tremors, sensory change, speech change, focal weakness, seizures, loss of consciousness and headaches.  Endo/Heme/Allergies: Negative for environmental allergies and polydipsia. Bruises/bleeds easily.  Psychiatric/Behavioral: Negative for depression, hallucinations, memory loss, substance abuse and suicidal ideas. The patient is nervous/anxious. The patient does not have insomnia.     Physical Exam  Constitutional: She is oriented to person, place, and time. She appears well-developed and well-nourished.  HENT:  Head: Normocephalic and atraumatic.  Eyes: Conjunctivae are normal. Pupils are equal, round, and reactive to light.  Neck: Normal range of motion. Neck supple.  Cardiovascular: Normal rate, regular rhythm, normal heart sounds and intact distal pulses.   Respiratory: Effort normal and breath sounds normal.  GI: Soft. Bowel  sounds are normal.  Musculoskeletal: Normal range of motion.  Neurological: She is alert and oriented to person,  place, and time.  Skin: Skin is warm.  Psychiatric: She has a normal mood and affect. Her behavior is normal. Judgment and thought content normal.    Encounter Medications:   Outpatient Encounter Prescriptions as of 10/07/2016  Medication Sig Note  . albuterol (PROVENTIL) (2.5 MG/3ML) 0.083% nebulizer solution Take 2.5 mg by nebulization every 6 (six) hours as needed for wheezing or shortness of breath.   Marland Kitchen atorvastatin (LIPITOR) 40 MG tablet Take 1 tablet (40 mg total) by mouth daily.   . beta carotene 25000 UNIT capsule Take 25,000 Units by mouth daily.   . cetirizine (ZYRTEC) 10 MG tablet Take 10 mg by mouth at bedtime.   . cholecalciferol (VITAMIN D) 400 units TABS tablet Take 400 Units by mouth 2 (two) times daily.   Marland Kitchen docusate sodium (COLACE) 100 MG capsule Take 100 mg by mouth daily.   Marland Kitchen escitalopram (LEXAPRO) 10 MG tablet Take 10 mg by mouth at bedtime.    . fenofibrate 54 MG tablet Take 1 tablet (54 mg total) by mouth daily.   . ferrous sulfate 324 (65 Fe) MG TBEC Take 1 tablet by mouth daily.   . fluticasone (FLONASE) 50 MCG/ACT nasal spray Place 1 spray into both nostrils daily as needed for allergies or rhinitis.   . furosemide (LASIX) 40 MG tablet Take 80 mg by mouth 2 (two) times daily.   . Insulin Glargine (BASAGLAR KWIKPEN) 100 UNIT/ML SOPN Inject 25 units into the skin daily (Patient taking differently: Inject 40 Units into the skin at bedtime. Inject 25 units into the skin daily)   . insulin lispro (HUMALOG KWIKPEN) 100 UNIT/ML KiwkPen CBG 70-120: 0 units; 121-150: 1 U; 151-200: 2 U; 201-250: 3 U; 251-300: 5 U; 301-350: 7 U; 351-400: 9 U 09/23/2016: Patient said she is injecting 24 units in the AM, 28 units at lunch and 28 units at bedtime (uses sliding scale when blood sugar is very high)  . ipratropium (ATROVENT) 0.02 % nebulizer solution Take 0.5 mg by nebulization every 6 (six) hours as needed for wheezing or shortness of breath.   . isosorbide-hydrALAZINE (BIDIL) 20-37.5 MG  tablet Take 1 tablet by mouth 3 (three) times daily.   . metFORMIN (GLUCOPHAGE-XR) 500 MG 24 hr tablet Take 1 tablet (500 mg total) by mouth 2 (two) times daily.   . metolazone (ZAROXOLYN) 2.5 MG tablet Take 1.25 mg by mouth daily.   . metoprolol succinate (TOPROL-XL) 25 MG 24 hr tablet Take 1 tablet (25 mg total) by mouth 2 (two) times daily.   . mirtazapine (REMERON) 15 MG tablet Take 15 mg by mouth at bedtime.   . modafinil (PROVIGIL) 200 MG tablet Take 1 tablet (200 mg total) by mouth daily.   . montelukast (SINGULAIR) 10 MG tablet Take 1 tablet (10 mg total) by mouth at bedtime.   . nitroGLYCERIN (NITROSTAT) 0.4 MG SL tablet Place 1 tablet (0.4 mg total) under the tongue every 5 (five) minutes as needed for chest pain.   Marland Kitchen nystatin (NYSTATIN) powder Apply topically 2 (two) times daily. Apply under breasts   . ondansetron (ZOFRAN-ODT) 4 MG disintegrating tablet Take 1 tablet (4 mg total) by mouth 2 (two) times daily as needed for nausea or vomiting.   Marland Kitchen oxyCODONE-acetaminophen (PERCOCET/ROXICET) 5-325 MG tablet Take 1 tablet by mouth every 8 (eight) hours as needed for severe pain.   . OXYGEN Inhale 3  L into the lungs continuous. 3 liters 24/7   . pantoprazole (PROTONIX) 20 MG tablet Take 1 tablet (20 mg total) by mouth 2 (two) times daily before a meal.   . PRESCRIPTION MEDICATION Inhale into the lungs See admin instructions. Use BIPAP every time laying down   . primidone (MYSOLINE) 50 MG tablet Take 1 tablet (50 mg total) by mouth at bedtime.   . Probiotic Product (PROBIOTIC PO) Take 1 tablet by mouth at bedtime.   Marland Kitchen QUEtiapine (SEROQUEL) 25 MG tablet Take 25 mg by mouth at bedtime.   . triamcinolone lotion (KENALOG) 0.1 % Apply 1 application topically 3 (three) times daily. For up to 14 days and as needed   . warfarin (COUMADIN) 4 MG tablet Take 1 tablet daily except 1/2 tablet on Wednesdays and Saturdays (Patient taking differently: Take 2-4 mg by mouth daily at 6 PM. Take 1 tablet daily  except 1/2 tablet on Wednesdays and Saturdays)    No facility-administered encounter medications on file as of 10/07/2016.     Functional Status:   In your present state of health, do you have any difficulty performing the following activities: 09/23/2016 09/07/2016  Hearing? Y N  Vision? Y N  Difficulty concentrating or making decisions? N N  Walking or climbing stairs? Y N  Dressing or bathing? N N  Doing errands, shopping? Y N  Some recent data might be hidden    Fall/Depression Screening:    Fall Risk  09/24/2016 09/06/2016 07/18/2016  Falls in the past year? - No No  Number falls in past yr: - - -  Injury with Fall? - - -  Risk Factor Category  - - -  Risk for fall due to : Impaired balance/gait;Impaired mobility;Impaired vision;Medication side effect - -  Follow up - - -   PHQ 2/9 Scores 09/06/2016 07/18/2016 06/07/2016 05/02/2016 04/19/2016  PHQ - 2 Score 0 0 0 1 0  PHQ- 9 Score - - - 5 -    Assessment:   Mr Balderson greeted THN CM at there door and discussed the safety ramp access patient uses with her scooter Denise Freeman is at her kitchen table Again she is assisting Mr Freeman be followed by [facility] CM but he refuses services again  Chronic medical conditions DM- 09/29/16 1404 254  09/29/16 1826 147  09/29/16 2242 192  09/30/16 171 1154  09/30/16 2146 318  10/01/16 1032 196  10/01/16 1811 144  10/01/16 2111 218  10/02/16 2206 183  10/02/16 2128 148  10/02/16 1540 190 Unable to pull up averages on her  Embrace glucometer    Medications: Additional medication today is isosorbide 20-37.5 mg tid  Mr Freeman had to redirect Denise Freeman at intervals and stated "she say her medicines cause her to be like this" (related to fast pace speech and anxious behavior) THN CM discussed COPD and bipolar symptoms  Denise Freeman to See her CVD coumadin clinic on 10/14/16 at 1340 Endocrinologist on 10/18/16 1015 Dr Gardenia Phlegm CVD Dr Debara Pickett on 10/30/16 at 1600 for follow up visit   Transportation services with [facility] made late her  late for a 7 am appointment and she is requesting assistance with transportation and possibly someone to assist with cleaning her home.  Discussed ADTS services in [facility] may be available Agreed to referral to [place]:   [facility] CM emphasized to Mr & Denise Freeman to put all his and her pharmacy silver script mail in separate big brown envelopes (  2018 medication forms/sheets) so they can keep up with qualifying for medication assistance $600 limit- some medication assistance programs met on today with pharmacy assistance [facility] CM assisted by setting Denise Freeman time on her Embrace glucometer THN CM with Collaboration with [place] and Pharmacy student, [place] to fill her pill boxes today and to write down what Mr & Denise Koskela will need to meet criteria for medication assistance on certain medications (Refer to Freeman notes) THN CM and [place] discussed blister pack with [place] in future Pt agreed to [place] assistance with this in future but had to be redirected not to contact her pharmacy but to allow [place] contact [facility] and pcp to get services initiated  [facility] CM scheduled next home visit with Denise Freeman for 10/22/16 vs 10/21/16 at 1200 noon because Denise Freeman is scheduled to have a spine scoliosis visit at 73 and 1100 for Mr [facility] write down to get normal saline spray to use prn for dry nose  THN CM able to review CHF and Afib zones with much redirection provided to remain on topic (more interested in scheduling appointments [facility] CM spoke with Armen at [facility] [phone] to inquire if pt needs a follow up surgical appointment.  Armen to return call to Denise [phone] [facility] after discussing with Dr Donne Hazel Confirmed Denise Freeman home number [facility] CM placed contact info for Dr Donne Hazel and Dr Zadie Rhine in slots in front of her [facility] blue 2018 calendar (after assisting with making or checking on f/u appts) Encouraged Denise Freeman to contact  them using info in future [facility] to complete [facility] referral for  Transportation and possible home personal care services  [facility] CM Care Plan Problem One     Most Recent Value  Care Plan Problem One  CHF home management deficit   Role Documenting the Problem One  Care Management Coordinator  Care Plan for Problem One  Active  THN Long Term Goal (31-90 days)  over the next 45 days the patient will be able to verbalize home care management interventions for CHF  [facility] Long Term Goal Start Date  09/18/16  Interventions for Problem One Long Term Goal  Review CHF diagnosis, CHF zones, medications, diet and activity  THN CM Short Term Goal #1 (0-30 days)  over the next 7 days the patietn will receive her portable oxygen and tubing from [facility]  THN CM Short Term Goal #1 Start Date  09/18/16  [facility] CM Short Term Goal #1 Met Date  09/23/16  Interventions for Short Term Goal #1  contact [facility] to request service for DME  [facility] CM Short Term Goal #2 (0-30 days)  Over the next 7-14 days the patient will have decreased CHF symptoms by evidence of decreased lower extremity edema and decreased soc  THN CM Short Term Goal #2 Start Date  09/18/16  Interventions for Short Term Goal #2  Assessment of CHF symptoms, call to CV office to report abnormal symptoms and request medcation treatment and hospital folllow up appointments, monitor and evaluation treatment plan   THN CM Short Term Goal #3 (0-30 days)  over the next 30 days the patient will be seen by [facility] pharmacy staff to review pharmacy discrepancies and offer resolutions  [facility] CM Short Term Goal #3 Start Date  09/23/16  Interventions for Short Tern Goal #3  referral to [place], review of medications, offer new pill box, monitor and evaluation pharmacy interventions  Zakiah Gauthreaux L. Lavina Hamman, RN, BSN, [place] Care Management [phone]

## 2016-10-08 ENCOUNTER — Other Ambulatory Visit: Payer: Self-pay | Admitting: *Deleted

## 2016-10-08 ENCOUNTER — Telehealth: Payer: Self-pay | Admitting: Internal Medicine

## 2016-10-08 ENCOUNTER — Other Ambulatory Visit: Payer: Self-pay | Admitting: Pharmacist

## 2016-10-08 ENCOUNTER — Telehealth: Payer: Self-pay | Admitting: Pulmonary Disease

## 2016-10-08 ENCOUNTER — Other Ambulatory Visit: Payer: Self-pay

## 2016-10-08 DIAGNOSIS — K219 Gastro-esophageal reflux disease without esophagitis: Secondary | ICD-10-CM

## 2016-10-08 DIAGNOSIS — I5032 Chronic diastolic (congestive) heart failure: Secondary | ICD-10-CM

## 2016-10-08 DIAGNOSIS — R251 Tremor, unspecified: Secondary | ICD-10-CM

## 2016-10-08 DIAGNOSIS — L304 Erythema intertrigo: Secondary | ICD-10-CM

## 2016-10-08 MED ORDER — METFORMIN HCL ER 500 MG PO TB24: 500.0000 mg | ORAL_TABLET | Freq: Two times a day (BID) | ORAL | 2 refills | Status: DC

## 2016-10-08 MED ORDER — FUROSEMIDE 40 MG PO TABS: 80.0000 mg | ORAL_TABLET | Freq: Two times a day (BID) | ORAL | 3 refills | Status: DC

## 2016-10-08 MED ORDER — FENOFIBRATE 54 MG PO TABS: 54.0000 mg | ORAL_TABLET | Freq: Every day | ORAL | 3 refills | Status: DC

## 2016-10-08 MED ORDER — METOLAZONE 2.5 MG PO TABS: 1.2500 mg | ORAL_TABLET | Freq: Every day | ORAL | 3 refills | Status: DC

## 2016-10-08 MED ORDER — ISOSORB DINITRATE-HYDRALAZINE 20-37.5 MG PO TABS: 1.0000 | ORAL_TABLET | Freq: Three times a day (TID) | ORAL | 3 refills | Status: DC

## 2016-10-08 NOTE — Telephone Encounter (Signed)
Pt would like the following medications faxed to [facility] in [place]  Fax: [phone] zofran, pantrazole, nystatin powder, primidone, vit d. Also it there something else she can take in place or metoprolol?  The pills cost $16 each.  Please advise

## 2016-10-08 NOTE — Telephone Encounter (Signed)
lmomtcb x1 

## 2016-10-08 NOTE — Patient Outreach (Signed)
[facility]) Care Management  [place]   10/08/2016  Dalissa Lovin [facility] 15-Sep-1950 [id number]  Subjective: Home visit completed with patient in her home. HIPAA identifiers were obtained.  [name] husband, Teacher, music Marye Round), and [place] Nurse Jackelyn Poling were also present.  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  Patient continues to manage her own medications.  She uses the seven day pill box left with her at our last visit. Inspection of the pill box revealed she filled the box correctly with the exception of one extra 1/2 tablet of warfarin in the Saturday evening box.  Patient was most concerned about the EOB from [employer] she received and wanted it to be reviewed.  Objective:   Encounter Medications: Outpatient Encounter Prescriptions as of 10/07/2016  Medication Sig Note  . albuterol (PROVENTIL) (2.5 MG/3ML) 0.083% nebulizer solution Take 2.5 mg by nebulization every 6 (six) hours as needed for wheezing or shortness of breath.   Marland Kitchen atorvastatin (LIPITOR) 40 MG tablet Take 1 tablet (40 mg total) by mouth daily.   . beta carotene 25000 UNIT capsule Take 25,000 Units by mouth daily.   . cetirizine (ZYRTEC) 10 MG tablet Take 10 mg by mouth at bedtime.   . cholecalciferol (VITAMIN D) 400 units TABS tablet Take 400 Units by mouth 2 (two) times daily.   Marland Kitchen docusate sodium (COLACE) 100 MG capsule Take 100 mg by mouth daily.   Marland Kitchen escitalopram (LEXAPRO) 10 MG tablet Take 10 mg by mouth at bedtime.    . fenofibrate 54 MG tablet Take 1 tablet (54 mg total) by mouth daily.   . ferrous sulfate 324 (65 Fe) MG TBEC Take 1 tablet by mouth daily.   . fluticasone (FLONASE) 50 MCG/ACT nasal spray Place 1 spray into both nostrils daily as needed for allergies  or rhinitis.   . furosemide (LASIX) 40 MG tablet Take 80 mg by mouth 2 (two) times daily.   . Insulin Glargine (BASAGLAR KWIKPEN) 100 UNIT/ML SOPN Inject 25 units into the skin daily (Patient taking differently: Inject 40 Units into the skin at bedtime. Inject 25 units into the skin daily)   . insulin lispro (HUMALOG KWIKPEN) 100 UNIT/ML KiwkPen CBG 70-120: 0 units; 121-150: 1 U; 151-200: 2 U; 201-250: 3 U; 251-300: 5 U; 301-350: 7 U; 351-400: 9 U 09/23/2016: Patient said she is injecting 24 units in the AM, 28 units at lunch and 28 units at bedtime (uses sliding scale when blood sugar is very high)  . ipratropium (ATROVENT) 0.02 % nebulizer solution Take 0.5 mg by nebulization every 6 (six) hours as needed for wheezing or shortness of breath.   . isosorbide-hydrALAZINE (BIDIL) 20-37.5 MG tablet Take 1 tablet by mouth 3 (three) times daily.   . metFORMIN (GLUCOPHAGE-XR) 500 MG 24 hr tablet Take 1 tablet (500 mg total) by mouth 2 (two) times daily.   . metolazone (ZAROXOLYN) 2.5 MG tablet Take 1.25 mg by mouth daily.   . metoprolol succinate (TOPROL-XL) 25 MG 24 hr tablet Take 1 tablet (25 mg total) by mouth 2 (two) times daily.   . mirtazapine (REMERON) 15 MG tablet Take 15 mg by mouth at bedtime.   . modafinil (PROVIGIL) 200 MG tablet Take 1 tablet (200 mg total) by mouth daily.   . montelukast (SINGULAIR) 10 MG tablet  Take 1 tablet (10 mg total) by mouth at bedtime.   . nitroGLYCERIN (NITROSTAT) 0.4 MG SL tablet Place 1 tablet (0.4 mg total) under the tongue every 5 (five) minutes as needed for chest pain.   Marland Kitchen nystatin (NYSTATIN) powder Apply topically 2 (two) times daily. Apply under breasts   . ondansetron (ZOFRAN-ODT) 4 MG disintegrating tablet Take 1 tablet (4 mg total) by mouth 2 (two) times daily as needed for nausea or vomiting.   Marland Kitchen oxyCODONE-acetaminophen (PERCOCET/ROXICET) 5-325 MG tablet Take 1 tablet by mouth every 8 (eight) hours as needed for severe pain.   . OXYGEN Inhale 3 L into the  lungs continuous. 3 liters 24/7   . pantoprazole (PROTONIX) 20 MG tablet Take 1 tablet (20 mg total) by mouth 2 (two) times daily before a meal.   . PRESCRIPTION MEDICATION Inhale into the lungs See admin instructions. Use BIPAP every time laying down   . primidone (MYSOLINE) 50 MG tablet Take 1 tablet (50 mg total) by mouth at bedtime.   . Probiotic Product (PROBIOTIC PO) Take 1 tablet by mouth at bedtime.   Marland Kitchen QUEtiapine (SEROQUEL) 25 MG tablet Take 25 mg by mouth at bedtime.   . triamcinolone lotion (KENALOG) 0.1 % Apply 1 application topically 3 (three) times daily. For up to 14 days and as needed   . warfarin (COUMADIN) 4 MG tablet Take 1 tablet daily except 1/2 tablet on Wednesdays and Saturdays (Patient taking differently: Take 2-4 mg by mouth daily at 6 PM. Take 1 tablet daily except 1/2 tablet on Wednesdays and Saturdays)    No facility-administered encounter medications on file as of 10/07/2016.     Functional Status: In your present state of health, do you have any difficulty performing the following activities: 09/23/2016 09/07/2016  Hearing? Y N  Vision? Y N  Difficulty concentrating or making decisions? N N  Walking or climbing stairs? Y N  Dressing or bathing? N N  Doing errands, shopping? Y N  Some recent data might be hidden    Fall/Depression Screening: Fall Risk  09/24/2016 09/06/2016 07/18/2016  Falls in the past year? - No No  Number falls in past yr: - - -  Injury with Fall? - - -  Risk Factor Category  - - -  Risk for fall due to : Impaired balance/gait;Impaired mobility;Impaired vision;Medication side effect - -  Follow up - - -   PHQ 2/9 Scores 09/06/2016 07/18/2016 06/07/2016 05/02/2016 04/19/2016  PHQ - 2 Score 0 0 0 1 0  PHQ- 9 Score - - - 5 -      Assessment: Patients medications were reviewed in the pil box and the actual medication bottles.  Patient fills the box herself and seems to be proficient in filling the box. However, it does take an extreme ammount  of effort since she cannot see well.  Patient would benefit from having her medications put in pill packaging and be delivered to her home.  Several local pharmacies provide pill packing a delivery.  After reviewing the various pharmacies in the area with pill packaging/bubble pack services, the patient decided to go with [facility].  The EOB paperwork the patient was concerned about was reviewed.  According to the EOB, she has not spent $1,100 (the requirement to receive [place] and Engineer, agricultural from [employer] Patient [facility].).  Patient was encouraged to keep all future EOBs.  Once she reaches $7322 an application can be submitted on the patient's behalf.    According  to her plan, after spending $1500 out of pocket, all of her medications are covered at 100% and without a coverage gap.   Plan:  1.  Route note to PCP.  2.  Coordinate getting the patient's medications switched over to [facility] (ask PCP to send new prescriptions. [place])  3.  Follow up with the patient in 1 week.

## 2016-10-08 NOTE — Progress Notes (Signed)
Please note patient will need new Rx's of cardiac meds we prescribe sent to [facility] for cost reasons.  [name]

## 2016-10-08 NOTE — Patient Outreach (Signed)
[place]) Care Management  10/08/2016  Denise Freeman [facility] [birth year] [id number]   Patient was called to follow up on transferring her prescriptions to [facility].  HIPAA identifiers were obtained.  Patient confirmed again she would like to receive her medications in blister packs/pill packaging.    [facility] [employer] was called to establish an account for the patient. Demographic and insurance information was provided so there will be a smooth transition.  In addition, the pharmacy locator on the [employer] website was used to be sure [facility] [employer] is in their network.  Notes were routed to the patient's providers ([name] and Dr. Ola Spurr) with requests to send the patient's active prescriptions to [facility].  Patient was asked to follow up with her providers about having prescriptions sent to [facility].  [facility] [employer] was added to the patient's clinical demographics.  Denise Freeman, PharmD, [place] Clinical Pharmacist [phone]

## 2016-10-09 MED ORDER — PRIMIDONE 50 MG PO TABS: 50.0000 mg | ORAL_TABLET | Freq: Every day | ORAL | 3 refills | Status: DC

## 2016-10-09 MED ORDER — CHOLECALCIFEROL 10 MCG (400 UNIT) PO TABS: 400.0000 [IU] | ORAL_TABLET | Freq: Two times a day (BID) | ORAL | 2 refills | Status: DC

## 2016-10-09 MED ORDER — ONDANSETRON 4 MG PO TBDP: 4.0000 mg | ORAL_TABLET | Freq: Two times a day (BID) | ORAL | 1 refills | Status: DC | PRN

## 2016-10-09 MED ORDER — METOPROLOL SUCCINATE ER 25 MG PO TB24: 25.0000 mg | ORAL_TABLET | Freq: Two times a day (BID) | ORAL | 3 refills | Status: DC

## 2016-10-09 MED ORDER — NYSTATIN 100000 UNIT/GM EX POWD: Freq: Two times a day (BID) | CUTANEOUS | 3 refills | Status: DC

## 2016-10-09 MED ORDER — PANTOPRAZOLE SODIUM 20 MG PO TBEC: 20.0000 mg | DELAYED_RELEASE_TABLET | Freq: Two times a day (BID) | ORAL | 1 refills | Status: DC

## 2016-10-09 NOTE — Patient Outreach (Signed)
[facility] [facility]) Care Management  10/09/2016  Denise Freeman [facility] Jan 22, 1951 [id number]   CSW made an initial attempt to try and contact patient today to perform phone assessment, as well as assess and assist with social needs and services, without success. A HIPPA compliant message was left for patient on voicemail (ph#: [phone]). CSW is currently awaiting a return call. CSW will make a second outreach attempt within the next week, if CSW does not receive a return call from patient in the meantime.    Raynaldo Opitz, LCSW [facility]  Clinical Social Worker cell #: [phone]

## 2016-10-09 NOTE — Telephone Encounter (Signed)
lmomtcb x 2  

## 2016-10-09 NOTE — Telephone Encounter (Signed)
Resent prescriptions to preferred pharmacy. Left message for patient to continue metoprolol. Consider going to a different pharmacy like [facility] for cheaper pricing (about $25 a month). Sent her paper Rx and coupon from [employer] to shop around. Also recommended calling her insurance company to see if there is a preferred pharmacy where medicine may be available for cheaper cost.  Barnet Pall, MD [place], PGY-2

## 2016-10-10 MED ORDER — MONTELUKAST SODIUM 10 MG PO TABS: 10.0000 mg | ORAL_TABLET | Freq: Every day | ORAL | 6 refills | Status: DC

## 2016-10-10 MED ORDER — MODAFINIL 200 MG PO TABS: 200.0000 mg | ORAL_TABLET | Freq: Every day | ORAL | 5 refills | Status: DC

## 2016-10-10 NOTE — Telephone Encounter (Addendum)
Pt is requesting a refill of her Montelukast and Modafinil --- both #30 days [facility] [employer] in [place] refilled as requested.  Pt scheduled with Dr Halford Chessman on 01/10/17 at 10am

## 2016-10-11 ENCOUNTER — Encounter: Payer: Self-pay | Admitting: Pharmacist

## 2016-10-11 ENCOUNTER — Other Ambulatory Visit: Payer: Self-pay | Admitting: *Deleted

## 2016-10-11 NOTE — Patient Outreach (Signed)
[facility]) Care Management  10/11/2016  Benjamin Merrihew [facility] 03-Oct-1950 [id number]   CSW contacted patient via phone [phone] to schedule initial home visit next Wednesday, 10/11/16 at 1:00pm. CSW will follow-up with full assessment and plan at that time.    Raynaldo Opitz, LCSW [facility]  Clinical Social Worker cell #: [phone]

## 2016-10-14 ENCOUNTER — Encounter: Payer: Self-pay | Admitting: *Deleted

## 2016-10-14 ENCOUNTER — Telehealth: Payer: Self-pay | Admitting: *Deleted

## 2016-10-14 ENCOUNTER — Ambulatory Visit: Payer: Self-pay | Admitting: Pharmacist

## 2016-10-14 NOTE — Telephone Encounter (Signed)
Please give pt a call  °

## 2016-10-14 NOTE — Telephone Encounter (Signed)
Pt missed appt today because she didn't feel well.  Appt rescheduled for 10/17/17 in [place].

## 2016-10-16 ENCOUNTER — Encounter: Payer: Self-pay | Admitting: *Deleted

## 2016-10-16 ENCOUNTER — Encounter: Payer: Self-pay | Admitting: Gynecology

## 2016-10-16 ENCOUNTER — Other Ambulatory Visit: Payer: Self-pay | Admitting: Pharmacist

## 2016-10-16 ENCOUNTER — Other Ambulatory Visit: Payer: Self-pay | Admitting: *Deleted

## 2016-10-16 NOTE — Patient Outreach (Signed)
[facility]) Care Management  [place]  10/16/2016   Denise Freeman Jan 16, 1951 [id number]    Encounter Medications:  Outpatient Encounter Prescriptions as of 10/16/2016  Medication Sig Note  . albuterol (PROVENTIL) (2.5 MG/3ML) 0.083% nebulizer solution Take 2.5 mg by nebulization every 6 (six) hours as needed for wheezing or shortness of breath.   Marland Kitchen atorvastatin (LIPITOR) 40 MG tablet Take 1 tablet (40 mg total) by mouth daily.   . beta carotene 25000 UNIT capsule Take 25,000 Units by mouth daily.   . cetirizine (ZYRTEC) 10 MG tablet Take 10 mg by mouth at bedtime.   . cholecalciferol (VITAMIN D) 400 units TABS tablet Take 1 tablet (400 Units total) by mouth 2 (two) times daily.   Marland Kitchen docusate sodium (COLACE) 100 MG capsule Take 100 mg by mouth daily.   Marland Kitchen escitalopram (LEXAPRO) 10 MG tablet Take 10 mg by mouth at bedtime.    . fenofibrate 54 MG tablet Take 1 tablet (54 mg total) by mouth daily.   . ferrous sulfate 324 (65 Fe) MG TBEC Take 1 tablet by mouth daily.   . fluticasone (FLONASE) 50 MCG/ACT nasal spray Place 1 spray into both nostrils daily as needed for allergies or rhinitis.   . furosemide (LASIX) 40 MG tablet Take 2 tablets (80 mg total) by mouth 2 (two) times daily.   . Insulin Glargine (BASAGLAR KWIKPEN) 100 UNIT/ML SOPN Inject 25 units into the skin daily (Patient taking differently: Inject 40 Units into the skin at bedtime. Inject 25 units into the skin daily)   . insulin lispro (HUMALOG KWIKPEN) 100 UNIT/ML KiwkPen CBG 70-120: 0 units; 121-150: 1 U; 151-200: 2 U; 201-250: 3 U; 251-300: 5 U; 301-350: 7 U; 351-400: 9 U 09/23/2016: Patient said she is injecting 24 units in the AM, 28 units at lunch and 28 units at bedtime (uses sliding scale when blood sugar is very high)  . ipratropium (ATROVENT) 0.02 % nebulizer solution Take 0.5 mg by nebulization every 6 (six) hours as needed for wheezing or shortness of breath.   . isosorbide-hydrALAZINE  (BIDIL) 20-37.5 MG tablet Take 1 tablet by mouth 3 (three) times daily.   . metFORMIN (GLUCOPHAGE-XR) 500 MG 24 hr tablet Take 1 tablet (500 mg total) by mouth 2 (two) times daily.   . metolazone (ZAROXOLYN) 2.5 MG tablet Take 0.5 tablets (1.25 mg total) by mouth daily.   . metoprolol succinate (TOPROL-XL) 25 MG 24 hr tablet Take 1 tablet (25 mg total) by mouth 2 (two) times daily.   . metoprolol succinate (TOPROL-XL) 25 MG 24 hr tablet Take 1 tablet (25 mg total) by mouth 2 (two) times daily.   . mirtazapine (REMERON) 15 MG tablet Take 15 mg by mouth at bedtime.   . modafinil (PROVIGIL) 200 MG tablet Take 1 tablet (200 mg total) by mouth daily.   . montelukast (SINGULAIR) 10 MG tablet Take 1 tablet (10 mg total) by mouth at bedtime.   . nitroGLYCERIN (NITROSTAT) 0.4 MG SL tablet Place 1 tablet (0.4 mg total) under the tongue every 5 (five) minutes as needed for chest pain.   Marland Kitchen nystatin (NYSTATIN) powder Apply topically 2 (two) times daily. Apply under breasts   . ondansetron (ZOFRAN-ODT) 4 MG disintegrating tablet Take 1 tablet (4 mg total) by mouth 2 (two) times daily as needed for nausea or vomiting.   Marland Kitchen oxyCODONE-acetaminophen (PERCOCET/ROXICET) 5-325 MG tablet Take 1 tablet by mouth every 8 (eight) hours as needed for severe pain.   Marland Kitchen  OXYGEN Inhale 3 L into the lungs continuous. 3 liters 24/7   . pantoprazole (PROTONIX) 20 MG tablet Take 1 tablet (20 mg total) by mouth 2 (two) times daily before a meal.   . PRESCRIPTION MEDICATION Inhale into the lungs See admin instructions. Use BIPAP every time laying down   . primidone (MYSOLINE) 50 MG tablet Take 1 tablet (50 mg total) by mouth at bedtime.   . Probiotic Product (PROBIOTIC PO) Take 1 tablet by mouth at bedtime.   Marland Kitchen QUEtiapine (SEROQUEL) 25 MG tablet Take 25 mg by mouth at bedtime.   . triamcinolone lotion (KENALOG) 0.1 % Apply 1 application topically 3 (three) times daily. For up to 14 days and as needed   . warfarin (COUMADIN) 4 MG tablet  Take 1 tablet daily except 1/2 tablet on Wednesdays and Saturdays (Patient taking differently: Take 2-4 mg by mouth daily at 6 PM. Take 1 tablet daily except 1/2 tablet on Wednesdays and Saturdays)    No facility-administered encounter medications on file as of 10/16/2016.     Functional Status:  In your present state of health, do you have any difficulty performing the following activities: 10/16/2016 09/23/2016  Hearing? Denise Freeman  Vision? Y Y  Difficulty concentrating or making decisions? Y N  Walking or climbing stairs? Y Y  Dressing or bathing? N N  Doing errands, shopping? N Y  Some recent data might be hidden    Fall/Depression Screening: Fall Risk  10/16/2016 09/24/2016 09/06/2016  Falls in the past year? Yes - No  Number falls in past yr: 2 or more - -  Injury with Fall? Yes - -  Risk Factor Category  High Fall Risk - -  Risk for fall due to : History of fall(s);Impaired mobility;Impaired vision Impaired balance/gait;Impaired mobility;Impaired vision;Medication side effect -  Follow up Education provided - -   PHQ 2/9 Scores 10/16/2016 09/06/2016 07/18/2016 06/07/2016 05/02/2016 04/19/2016  PHQ - 2 Score 2 0 0 0 1 0  PHQ- 9 Score 9 - - - 5 -    Assessment:   Denise Freeman met with patient & husband, Denise Freeman at patient's home for initial home visit. Denise Freeman has received referral from Denise Freeman, Denise Freeman for transportation & personal care services. Denise Freeman Patient states that transportation services through [employer] made her late for 7am appointment and is requesting assistance & possibly someone to assist with cleaning her home. Denise Freeman provided patient with information on [facility] and encouraged them to call ahead to get on their schedule. Denise Freeman also provided patient with information on Companion Care Services through [place] as well. Patient states that she needs assistance with housekeeping and might talk with someone "down the street" to see how much she would charge to compare prices.  Denise Freeman completed initial assessment and discussed advance directives with patient - patient shared story of her sister's passing and how traumatizing it was for her. Denise Freeman encouraged her and her husband to have Advance Directives completed so as not to put others in that same situation. Patient has packets and states that she will look over them and bring them to her next doctors appointment to see if they happen to have a notary there to notarize them. Denise Freeman will look into other options for notaries incase they don't have one at her doctors office. Patient has been seeing a psychiatrist for bipolar diagnosis and her last visit was yesterday with Dr. Bernita Raisin. Patient was mostly concerned about getting her medications straightened out -  she had ran out of her pain medications but was on the phone with her doctor & pharmacy to have more ordered/delivered. Patient has a follow-up visit with [facility], [name] Pharmacist, Alwyn Ren next Tuesday, 5/22 which she is looking forward to.     Plan:   [facility] CM Care Plan Problem One     Most Recent Value  Care Plan Problem One  Transportation & personal care needs  Role Documenting the Problem One  Clinical Social Worker  Care Plan for Problem One  Active  [facility] Long Term Goal (31-90 days)  over the next 45 days the patient will communicate with Denise Freeman community resource needs  [facility] Long Term Goal Start Date  10/16/16  Interventions for Problem One Long Term Goal  Denise Freeman provided patient & husband with transportation resources through [employer] along with companion care services,       Denise Freeman will plan to follow-up with patient in 3 weeks to connect patient with community resources (transportation/companion care services) and advance directives.    Raynaldo Opitz, LCSW [facility]  Clinical Social Worker cell #: [phone]

## 2016-10-16 NOTE — Patient Outreach (Signed)
[facility]) Care Management  10/16/2016  Denise Freeman [facility] March 11, 1951 [id number]  [facility] was called on the patient's behalf.  I spoke with Lytle Michaels (pharmacist).  [facility] reported patient was scheduled to get her pill pack system this afternoon. The pack would be delivered by a [employer].  There was one medication that was not eligible to go into the pack and that was furosemide because it had been filled earlier this week at [facility].    Patient was called. HIPAA identifiers were obtained.  Patient communicated understanding about furosemide and was excited to get her pill pack system today.    Plan:  1.  I will conduct a home visit with the patient to follow up on her [place] 10/22/16 at 10:00am  [name], PharmD, [place] Clinical Pharmacist [phone]

## 2016-10-17 ENCOUNTER — Ambulatory Visit (INDEPENDENT_AMBULATORY_CARE_PROVIDER_SITE_OTHER): Payer: Medicare Other | Admitting: *Deleted

## 2016-10-17 DIAGNOSIS — I480 Paroxysmal atrial fibrillation: Secondary | ICD-10-CM | POA: Diagnosis not present

## 2016-10-17 LAB — POCT INR: INR: 1.8

## 2016-10-17 MED ORDER — WARFARIN SODIUM 4 MG PO TABS: ORAL_TABLET | ORAL | 3 refills | Status: DC

## 2016-10-18 ENCOUNTER — Ambulatory Visit: Payer: Medicare Other | Admitting: Internal Medicine

## 2016-10-18 DIAGNOSIS — M5136 Other intervertebral disc degeneration, lumbar region: Secondary | ICD-10-CM | POA: Diagnosis not present

## 2016-10-18 DIAGNOSIS — M797 Fibromyalgia: Secondary | ICD-10-CM | POA: Diagnosis not present

## 2016-10-18 DIAGNOSIS — G894 Chronic pain syndrome: Secondary | ICD-10-CM | POA: Diagnosis not present

## 2016-10-21 ENCOUNTER — Other Ambulatory Visit: Payer: Self-pay | Admitting: Internal Medicine

## 2016-10-21 MED ORDER — METOPROLOL SUCCINATE ER 25 MG PO TB24: 25.0000 mg | ORAL_TABLET | Freq: Two times a day (BID) | ORAL | 3 refills | Status: DC

## 2016-10-21 NOTE — Telephone Encounter (Signed)
All cardiac medications apart from Toprol XL have already been sent to [facility]. Rx(s) sent to pharmacy electronically.  Reference: From: [name], [facility]  Sent: 10/08/2016  2:53 PM  To: Pixie Casino, MD   [name],   Please see the attached [facility] Pharmacist's note. A home visit was completed with Mrs. Fleischhacker yesterday. In an effort to help her with medication adherence, she is being referred to [facility] to receive her medication in blister packaging.    To help this process, new prescriptions of her active prescriptions need to be sent to [facility] (the pharmacy has already been added to her clinical demographics). New prescriptions are being requested versus transferring from her old pharmacy to decrease medication errors.   Thank you so much for your time and consideration.   [name], PharmD, [place] Clinical Pharmacist  [phone]

## 2016-10-22 ENCOUNTER — Ambulatory Visit: Payer: Self-pay | Admitting: *Deleted

## 2016-10-22 ENCOUNTER — Other Ambulatory Visit: Payer: Self-pay | Admitting: Pharmacist

## 2016-10-22 ENCOUNTER — Encounter: Payer: Self-pay | Admitting: Pharmacist

## 2016-10-22 NOTE — Patient Outreach (Signed)
[facility]) Care Management  [place]   10/22/2016  Saory Carriero [facility] September 28, 1950 [id number]  Subjective: Home visit conducted with patient in her home. Purpose of today's visit was to review the "SureMed" Pill Pack system provided to the patient by [employer].  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  Patient has received a weekly Pill Back System and appears to be using it with ease. The writing is small on the back of the pack but the patient uses a magnifying glass.   Objective:   Encounter Medications: Outpatient Encounter Prescriptions as of 10/22/2016  Medication Sig Note  . albuterol (PROVENTIL) (2.5 MG/3ML) 0.083% nebulizer solution Take 2.5 mg by nebulization every 6 (six) hours as needed for wheezing or shortness of breath.   Marland Kitchen atorvastatin (LIPITOR) 40 MG tablet Take 1 tablet (40 mg total) by mouth daily.   . beta carotene 25000 UNIT capsule Take 25,000 Units by mouth daily.   . cetirizine (ZYRTEC) 10 MG tablet Take 10 mg by mouth at bedtime.   . cholecalciferol (VITAMIN D) 400 units TABS tablet Take 1 tablet (400 Units total) by mouth 2 (two) times daily.   Marland Kitchen docusate sodium (COLACE) 100 MG capsule Take 100 mg by mouth daily.   Marland Kitchen escitalopram (LEXAPRO) 10 MG tablet Take 10 mg by mouth at bedtime.    . fenofibrate 54 MG tablet Take 1 tablet (54 mg total) by mouth daily.   . ferrous sulfate 324 (65 Fe) MG TBEC Take 1 tablet by mouth daily.   . fluticasone (FLONASE) 50 MCG/ACT nasal spray Place 1 spray into both nostrils daily as needed for allergies or rhinitis.   . furosemide (LASIX) 40 MG tablet Take 2 tablets (80 mg total) by mouth 2 (two) times daily.   . Insulin Glargine (BASAGLAR KWIKPEN) 100 UNIT/ML SOPN Inject 25 units into the skin  daily (Patient taking differently: Inject 40 Units into the skin at bedtime. Inject 25 units into the skin daily)   . insulin lispro (HUMALOG KWIKPEN) 100 UNIT/ML KiwkPen CBG 70-120: 0 units; 121-150: 1 U; 151-200: 2 U; 201-250: 3 U; 251-300: 5 U; 301-350: 7 U; 351-400: 9 U 09/23/2016: Patient said she is injecting 24 units in the AM, 28 units at lunch and 28 units at bedtime (uses sliding scale when blood sugar is very high)  . ipratropium (ATROVENT) 0.02 % nebulizer solution Take 0.5 mg by nebulization every 6 (six) hours as needed for wheezing or shortness of breath.   . isosorbide-hydrALAZINE (BIDIL) 20-37.5 MG tablet Take 1 tablet by mouth 3 (three) times daily.   . metFORMIN (GLUCOPHAGE-XR) 500 MG 24 hr tablet Take 1 tablet (500 mg total) by mouth 2 (two) times daily.   . metolazone (ZAROXOLYN) 2.5 MG tablet Take 0.5 tablets (1.25 mg total) by mouth daily.   . metoprolol succinate (TOPROL-XL) 25 MG 24 hr tablet Take 1 tablet (25 mg total) by mouth 2 (two) times daily.   . mirtazapine (REMERON) 15 MG tablet Take 15 mg by mouth at bedtime.   . modafinil (PROVIGIL) 200 MG tablet Take 1 tablet (200 mg total) by mouth daily.   . montelukast (SINGULAIR) 10 MG tablet Take 1 tablet (10 mg total) by mouth at bedtime.   . nitroGLYCERIN (NITROSTAT) 0.4 MG SL tablet Place 1 tablet (0.4 mg  total) under the tongue every 5 (five) minutes as needed for chest pain.   Marland Kitchen nystatin (NYSTATIN) powder Apply topically 2 (two) times daily. Apply under breasts   . ondansetron (ZOFRAN-ODT) 4 MG disintegrating tablet Take 1 tablet (4 mg total) by mouth 2 (two) times daily as needed for nausea or vomiting.   Marland Kitchen oxyCODONE-acetaminophen (PERCOCET/ROXICET) 5-325 MG tablet Take 1 tablet by mouth every 8 (eight) hours as needed for severe pain.   . OXYGEN Inhale 3 L into the lungs continuous. 3 liters 24/7   . pantoprazole (PROTONIX) 20 MG tablet Take 1 tablet (20 mg total) by mouth 2 (two) times daily before a meal.   .  PRESCRIPTION MEDICATION Inhale into the lungs See admin instructions. Use BIPAP every time laying down   . primidone (MYSOLINE) 50 MG tablet Take 1 tablet (50 mg total) by mouth at bedtime.   . Probiotic Product (PROBIOTIC PO) Take 1 tablet by mouth at bedtime.   Marland Kitchen QUEtiapine (SEROQUEL) 25 MG tablet Take 25 mg by mouth at bedtime.   . triamcinolone lotion (KENALOG) 0.1 % Apply 1 application topically 3 (three) times daily. For up to 14 days and as needed   . warfarin (COUMADIN) 4 MG tablet Take 1 tablet daily except 1/2 tablet on Tuesdays    No facility-administered encounter medications on file as of 10/22/2016.     Functional Status: In your present state of health, do you have any difficulty performing the following activities: 10/16/2016 09/23/2016  Hearing? [name]  Vision? Y Y  Difficulty concentrating or making decisions? Y N  Walking or climbing stairs? Y Y  Dressing or bathing? N N  Doing errands, shopping? N Y  Some recent data might be hidden    Fall/Depression Screening: Fall Risk  10/16/2016 09/24/2016 09/06/2016  Falls in the past year? Yes - No  Number falls in past yr: 2 or more - -  Injury with Fall? Yes - -  Risk Factor Category  High Fall Risk - -  Risk for fall due to : History of fall(s);Impaired mobility;Impaired vision Impaired balance/gait;Impaired mobility;Impaired vision;Medication side effect -  Follow up Education provided - -   PHQ 2/9 Scores 10/16/2016 09/06/2016 07/18/2016 06/07/2016 05/02/2016 04/19/2016  PHQ - 2 Score 2 0 0 0 1 0  PHQ- 9 Score 9 - - - 5 -      Assessment: The SureMed weekly pill pack system was reviewed    Patient was missing four medications:  Atorvastatin 40mg , Metoprolol Tartrate 25mg , Modafinil 200mg , and  Furosemide 40mg .  [facility] was called. I spoke with one of their Pharmacist's (Melissa) who stated patient's doctor called in metoprolol this morning.  Their lead pharmacist Lynnae Sandhoff), was at the patient's home yesterday.   He took the patient's remaining stock of medications and will repackage and get them back to the patient today.  Melissa wrote down what the patient is/was missing out of her pill packs and said they would reconcile and fix everything this afternoon.   Plan:  Follow up with the patient by the end of the week.  [name], PharmD, [place] Clinical Pharmacist [phone]

## 2016-10-23 ENCOUNTER — Ambulatory Visit (INDEPENDENT_AMBULATORY_CARE_PROVIDER_SITE_OTHER): Payer: Medicare Other | Admitting: *Deleted

## 2016-10-23 ENCOUNTER — Other Ambulatory Visit: Payer: Self-pay | Admitting: *Deleted

## 2016-10-23 DIAGNOSIS — I480 Paroxysmal atrial fibrillation: Secondary | ICD-10-CM | POA: Diagnosis not present

## 2016-10-23 LAB — POCT INR: INR: 2.5

## 2016-10-23 NOTE — Patient Outreach (Signed)
[facility]) Care Management   10/23/2016  Lakiah Dhingra [facility] 04/17/1951 [id number]  Denise Freeman is an 66 y.o. 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   Mrs Deignan was referred to [place] community coordinator on 09/16/16 by [facility] hospital liaison [facility]), V Brewer,for Transition of care services after her 09/14/16 hospital discharge for RUQ abdominal pain with s/p high risk laparoscopic cholecystectomy on 09/13/16 for acute cholecystitis without biliary dilation.   Subjective:  "I got to go to social security today and talk to them people about my medicare part D"  "my eyes felt like they were coming out of my head until I just stopped and went to sleep"  Objective:   Review of Systems  Constitutional: Negative.  Negative for chills, diaphoresis, fever, malaise/fatigue and weight loss.  HENT: Negative.  Negative for congestion, ear discharge, ear pain, hearing loss, nosebleeds, sinus pain, sore throat and tinnitus.   Eyes: Negative.  Negative for blurred vision, double vision, photophobia, pain, discharge and redness.  Respiratory: Positive for shortness of breath. Negative for cough, hemoptysis, sputum production, wheezing and stridor.        Confirms sob when "rushing" with nausea  Cardiovascular: Negative.  Negative for chest pain, palpitations, orthopnea, claudication, leg swelling and PND.  Gastrointestinal: Negative.  Negative for abdominal pain, blood in stool, constipation, diarrhea, heartburn, melena, nausea and vomiting.  Genitourinary: Negative.  Negative for dysuria, flank pain, frequency, hematuria and urgency.  Musculoskeletal: Negative.  Negative for  back pain, falls, joint pain, myalgias and neck pain.  Skin: Negative.  Negative for itching and rash.  Neurological: Positive for headaches. Negative for dizziness, tingling, tremors, sensory change, speech change, focal weakness, seizures, loss of consciousness and weakness.       Had headache for 4 days   Endo/Heme/Allergies: Negative for environmental allergies and polydipsia. Does not bruise/bleed easily.  Psychiatric/Behavioral: Negative for depression, hallucinations, memory loss, substance abuse and suicidal ideas. The patient is nervous/anxious and has insomnia.     Physical Exam  Constitutional: She is oriented to person, place, and time. She appears well-developed and well-nourished.  HENT:  Head: Normocephalic.  Neck: Normal range of motion. Neck supple.  Cardiovascular: Normal rate and normal heart sounds.   Respiratory: Effort normal and breath sounds normal.  GI: Soft. Bowel sounds are normal.  Musculoskeletal: Normal range of motion.  Neurological: She is alert and oriented to person, place, and time.  Skin: Skin is warm and dry.  Psychiatric: She has a normal mood and affect. Her behavior is normal. Judgment and thought content normal.    Encounter Medications:   Outpatient Encounter Prescriptions as of 10/23/2016  Medication Sig Note  . albuterol (PROVENTIL) (2.5 MG/3ML) 0.083% nebulizer solution Take 2.5 mg by nebulization every 6 (six) hours as needed for wheezing or shortness of breath.   Marland Kitchen atorvastatin (LIPITOR) 40 MG tablet Take 1 tablet (40 mg total) by mouth daily.   . beta carotene 25000 UNIT capsule Take 25,000 Units by mouth daily.   . cetirizine (ZYRTEC) 10 MG tablet Take 10 mg by mouth at bedtime.   . cholecalciferol (VITAMIN D) 400 units TABS tablet Take 1 tablet (400 Units total) by mouth 2 (two) times daily.   Marland Kitchen  docusate sodium (COLACE) 100 MG capsule Take 100 mg by mouth daily.   Marland Kitchen escitalopram (LEXAPRO) 10 MG tablet Take 10 mg by mouth at bedtime.     . fenofibrate 54 MG tablet Take 1 tablet (54 mg total) by mouth daily.   . ferrous sulfate 324 (65 Fe) MG TBEC Take 1 tablet by mouth daily.   . fluticasone (FLONASE) 50 MCG/ACT nasal spray Place 1 spray into both nostrils daily as needed for allergies or rhinitis.   . furosemide (LASIX) 40 MG tablet Take 2 tablets (80 mg total) by mouth 2 (two) times daily.   . Insulin Glargine (BASAGLAR KWIKPEN) 100 UNIT/ML SOPN Inject 25 units into the skin daily (Patient taking differently: Inject 40 Units into the skin at bedtime. Inject 25 units into the skin daily)   . insulin lispro (HUMALOG KWIKPEN) 100 UNIT/ML KiwkPen CBG 70-120: 0 units; 121-150: 1 U; 151-200: 2 U; 201-250: 3 U; 251-300: 5 U; 301-350: 7 U; 351-400: 9 U 09/23/2016: Patient said she is injecting 24 units in the AM, 28 units at lunch and 28 units at bedtime (uses sliding scale when blood sugar is very high)  . ipratropium (ATROVENT) 0.02 % nebulizer solution Take 0.5 mg by nebulization every 6 (six) hours as needed for wheezing or shortness of breath.   . isosorbide-hydrALAZINE (BIDIL) 20-37.5 MG tablet Take 1 tablet by mouth 3 (three) times daily.   . metFORMIN (GLUCOPHAGE-XR) 500 MG 24 hr tablet Take 1 tablet (500 mg total) by mouth 2 (two) times daily.   . metolazone (ZAROXOLYN) 2.5 MG tablet Take 0.5 tablets (1.25 mg total) by mouth daily.   . metoprolol succinate (TOPROL-XL) 25 MG 24 hr tablet Take 1 tablet (25 mg total) by mouth 2 (two) times daily.   . mirtazapine (REMERON) 15 MG tablet Take 15 mg by mouth at bedtime.   . modafinil (PROVIGIL) 200 MG tablet Take 1 tablet (200 mg total) by mouth daily.   . montelukast (SINGULAIR) 10 MG tablet Take 1 tablet (10 mg total) by mouth at bedtime.   . nitroGLYCERIN (NITROSTAT) 0.4 MG SL tablet Place 1 tablet (0.4 mg total) under the tongue every 5 (five) minutes as needed for chest pain.   Marland Kitchen nystatin (NYSTATIN) powder Apply topically 2 (two) times daily. Apply under breasts   . ondansetron  (ZOFRAN-ODT) 4 MG disintegrating tablet Take 1 tablet (4 mg total) by mouth 2 (two) times daily as needed for nausea or vomiting.   Marland Kitchen oxyCODONE-acetaminophen (PERCOCET/ROXICET) 5-325 MG tablet Take 1 tablet by mouth every 8 (eight) hours as needed for severe pain.   . OXYGEN Inhale 3 L into the lungs continuous. 3 liters 24/7   . pantoprazole (PROTONIX) 20 MG tablet Take 1 tablet (20 mg total) by mouth 2 (two) times daily before a meal.   . PRESCRIPTION MEDICATION Inhale into the lungs See admin instructions. Use BIPAP every time laying down   . primidone (MYSOLINE) 50 MG tablet Take 1 tablet (50 mg total) by mouth at bedtime.   . Probiotic Product (PROBIOTIC PO) Take 1 tablet by mouth at bedtime.   Marland Kitchen QUEtiapine (SEROQUEL) 25 MG tablet Take 25 mg by mouth at bedtime.   . triamcinolone lotion (KENALOG) 0.1 % Apply 1 application topically 3 (three) times daily. For up to 14 days and as needed   . warfarin (COUMADIN) 4 MG tablet Take 1 tablet daily except 1/2 tablet on Tuesdays    No facility-administered encounter medications on file as of 10/23/2016.  Functional Status:   In your present state of health, do you have any difficulty performing the following activities: 10/16/2016 09/23/2016  Hearing? [name]  Vision? Y Y  Difficulty concentrating or making decisions? Y N  Walking or climbing stairs? Y Y  Dressing or bathing? N N  Doing errands, shopping? N Y  Some recent data might be hidden    Fall/Depression Screening:    Fall Risk  10/16/2016 09/24/2016 09/06/2016  Falls in the past year? Yes - No  Number falls in past yr: 2 or more - -  Injury with Fall? Yes - -  Risk Factor Category  High Fall Risk - -  Risk for fall due to : History of fall(s);Impaired mobility;Impaired vision Impaired balance/gait;Impaired mobility;Impaired vision;Medication side effect -  Follow up Education provided - -   PHQ 2/9 Scores 10/16/2016 09/06/2016 07/18/2016 06/07/2016 05/02/2016 04/19/2016  PHQ - 2 Score 2 0 0  0 1 0  PHQ- 9 Score 9 - - - 5 -    Assessment:   Today Mrs Lambe is dressed and at her dining room table filling in her [facility] Calendar and reviewing letters.  She still is speaking at a fast pace and reports being upset with SSI and her pcp office not seeing her yesterday when they saw Mr Kaas still did not do anything for his right foot.  THN CM encouraged Mr Lemus to see his podiatrist vs pcp    Acute  Medical conditions Left ear boil with pain and headache x 4 days Mrs Scriven sttates her acute headache has resolved as the boil has healed On January 13 2017 at 1 pm she follows up with Dr Bernita Raisin who has her on remeron and lexapro   Mrs Hochstein showed [facility] CM that she had new pill packaging system from her pharmacy and she is very pleased with it after Mr Lynnae Sandhoff and [facility] pharmacist assisted with showing her the use of it Mr and Mrs Nofziger left the home when CM left to go to [facility] office using their Lucianne Lei, protable oxygen and w/c   Plan:  [facility] CM will see Mrs [place] in 2 weeks for follow visit and continue to collaborate with [facility] pharmacist  [facility] CM Care Plan Problem One     Most Recent Value  Care Plan Problem One  CHF home management deficit   Role Documenting the Problem One  Care Management Coordinator  Care Plan for Problem One  Active  [facility] Long Term Goal   over the next 45 days the patient will be able to verbalize home care management interventions for CHF  [facility] Long Term Goal Start Date  09/18/16  Interventions for Problem One Long Term Goal  Review CHF diagnosis, CHF zones, medications, diet and activity  THN CM Short Term Goal #1   over the next 7 days the patietn will receive her portable oxygen and tubing from [facility]  [facility] CM Short Term Goal #1 Start Date  09/18/16  [facility] CM Short Term Goal #1 Met Date  09/23/16  Interventions for Short Term Goal #1  contact [facility] to request service for DME  [facility] CM Short Term Goal #2   Over the next 7-14 days the patient will have decreased CHF symptoms  by evidence of decreased lower extremity edema and decreased soc  [facility]N CM Short Term Goal #2 Start Date  09/18/16  [facility] CM Short Term Goal #2 Met Date  (P) 09/23/16  Interventions for Short Term Goal #2  Assessment of CHF symptoms, call to [facility] office to report abnormal symptoms and request medcation treatment and hospital folllow up appointments, monitor and evaluation treatment plan   THN CM Short Term Goal #3  over the next 30 days the patient will be seen by [facility] pharmacy staff to review pharmacy discrepancies and offer resolutions  [facility] CM Short Term Goal #3 Start Date  09/23/16  [facility] CM Short Term Goal #3 Met Date  (P) 10/25/16  Interventions for Short Tern Goal #3  referral to [place], review of medications, offer new pill box, monitor and evaluation pharmacy interventions       Abimael Zeiter L. Lavina Hamman, RN, BSN, [place] Care Management [phone]

## 2016-10-24 ENCOUNTER — Other Ambulatory Visit: Payer: Self-pay | Admitting: Pharmacist

## 2016-10-24 NOTE — Patient Outreach (Signed)
[facility]) Care Management  10/24/2016  Denise Freeman [facility] 1951-05-17 [id number]   Called patient to follow up prior to the weekend and long holiday on her pill pack.  Unfortunately, she did not answer the phone.  HIPAA compliant message was left on her voicemail.  Spoke with Melissa at [facility] yesterday who said everything had been straightened out and the only medication that was not in the pill pack was atorvastatin. Atorvastatin would be in a bottle until the next packing system is delivered.  [name], PharmD, [place] Clinical Pharmacist [phone]

## 2016-10-30 ENCOUNTER — Ambulatory Visit (INDEPENDENT_AMBULATORY_CARE_PROVIDER_SITE_OTHER): Payer: Medicare Other | Admitting: Internal Medicine

## 2016-10-30 ENCOUNTER — Encounter: Payer: Self-pay | Admitting: Internal Medicine

## 2016-10-30 VITALS — BP 106/52 | HR 72 | Wt 190.0 lb

## 2016-10-30 DIAGNOSIS — N183 Chronic kidney disease, stage 3 unspecified: Secondary | ICD-10-CM

## 2016-10-30 DIAGNOSIS — I5043 Acute on chronic combined systolic (congestive) and diastolic (congestive) heart failure: Secondary | ICD-10-CM | POA: Diagnosis not present

## 2016-10-30 DIAGNOSIS — I482 Chronic atrial fibrillation: Secondary | ICD-10-CM | POA: Diagnosis not present

## 2016-10-30 DIAGNOSIS — Z79899 Other long term (current) drug therapy: Secondary | ICD-10-CM | POA: Diagnosis not present

## 2016-10-30 DIAGNOSIS — I4821 Permanent atrial fibrillation: Secondary | ICD-10-CM

## 2016-10-30 MED ORDER — METOLAZONE 2.5 MG PO TABS: 2.5000 mg | ORAL_TABLET | Freq: Every day | ORAL | 3 refills | Status: DC

## 2016-10-30 MED ORDER — LOSARTAN POTASSIUM 50 MG PO TABS
50.0000 mg | ORAL_TABLET | Freq: Every day | ORAL | 3 refills | Status: DC
Start: 2016-10-30 — End: 2016-12-09

## 2016-10-30 NOTE — Patient Instructions (Addendum)
Your physician has recommended you make the following change in your medication:  -- INCREASE metolazone to 2.5mg  (whole tablet) every day -- STOP Bidil (isosorbide-hydralazine) -- START losartan 50mg  once daily  Your physician recommends that you return for lab work in [place] recommends that you schedule a follow-up appointment in [place] with [name]

## 2016-10-30 NOTE — Progress Notes (Signed)
OFFICE NOTE  Chief Complaint:  Weight gain, shortness of breath  Primary Care Physician: Rogue Bussing, MD  HPI:  Denise Freeman is a pleasant 66 year old female who is establishing cardiac care today. She is accompanied by her husband and they recently moved here from [place], [place]. She has a history of severe COPD on home oxygen. She also has a history of stress-induced cardiomyopathy in the past. She has since had recovery of her EF. She's had numerous cardiac catheterizations. None of which showed obstructive coronary disease. Her last cardiac catheterization was in October 2014 which demonstrated no significant coronary disease. This was after a small abnormality was noted in the apex suggestive of ischemia on a nuclear stress test. She does have a history of permanent atrial fibrillation on Coumadin. She will need to have her INRs followed here. Unfortunate she has not had her INR checked in over 9 weeks and it was assessed today and was low. We will need to adjust her medication. She will be established in our anticoagulation clinic. Blood pressure looks well controlled today. She is on cholesterol medication.  I saw Denise Freeman back today in the office. Unfortunate she was hospitalized in December for worsening shortness of breath and probable pneumonia with a mild diastolic heart failure exacerbation. She was given IV diuretics and her Lasix was increased to 40 mg 3 times a day. She's also taking metolazone 3 times weekly. She continues to complain of leg edema which is mostly dependent. She has significant shortness of breath and CO2 retention and an appointment with a pulmonologist is pending.  Denise Freeman returned today back in the office. Her main complaints are a number of excoriations and weeping lesions on her face and arms. She apparently is going to see a dermatologist about this. She's also had worsening leg swelling and shortness of breath. Her weight is now  up about 8 pounds since her last visit. She was instructed to take Lasix 40 mg 3 times a day but apparently she is taking it twice a day. She is taking the metolazone 3 times a week. She is wearing compression stockings which I had advised and seems that this helps her swelling.  Denise Freeman returns today and reports a big improvement in her leg swelling. She's currently on Lasix 60 mg twice daily and metolazone 3x weekly. Weight is come down and swelling is improved. Her BNP was over 250 and is come down to about 125.  I saw Denise Freeman back today in follow-up. She's been successful losing over 15 pounds which I congratulated her on. Her breathing continues to be fairly difficult. She is using BiPAP at night. She's been in the ER a few times for mostly difficulty breathing and COPD exacerbation. She tells me the other day she was in church and had an episode of sharp chest pain in her left anterior chest and left scapular area. She was quite upset and emotional today time. Recently she's been the target of possibly a scam, so that she is under a lot of stress. She was upset that she did not have anything to take for her chest discomfort. I tried to reassure her that her coronary arteries have look normal with multiple catheterizations. She does have some diastolic dysfunction I suspect her LVEDP goes up when she gets upset causing her chest discomfort.  10/31/2015  Denise Freeman was seen today in follow-up from her recent hospitalization. She was seen in January for acute GI  bleeding thought to be upper GI bleeding and required transfusion for hemoglobin of 5. She was evaluated by Dr. Silvano Rusk with [facility] who recommended an EGD. Unfortunately, it looks like she was discharged the next day, possibly [place] after she removed her IV and stated she needed to go home. She relates that she never saw the specialist and does not recall seeing Dr. Carlean Purl. Since that time she's  continued to have blood in her stool. She was taken off of Xarelto and not restarted on anticoagulation. She does have a high CHADSVASC score of 5, suggesting a higher risk of stroke.  08/07/2016  Denise Freeman returns for follow-up. She underwent GI work-up with no clear bleeding source. She does not want to be on anticoagulation for fear of another stroke. She attributes her vision loss and word-finding difficulty to her prior stroke on Xarelto. She was previously on warfarin in the distant past, but reported having difficulty maintaining a therapeutic INR. We discussed the possibility of a Watchman device today - she seems interested in this. I explained the procedure and will refer her to Dr. Rayann Heman for evaluation.  5/30/20181  Denise Freeman was seen today in follow-up. She recently saw Ignacia Bayley, NP, after hospital follow-up. Given her recurrent  cardiomyopathy with EF 30-35%, he recommended starting her on BiDil. This is based on recent lab work demonstrating significant renal insufficiency however repeat lab work shows normal creatinine. She recently has had 10 pound weight gain and feels that it is related to a decrease in her metolazone to a half tablet daily. Previously had had her on 80 mg Lasix twice a day and metolazone 2.5 mg daily.  PMHx:  Past Medical History:  Diagnosis Date  . Allergy   . Anemia   . Anxiety   . Asthma   . Atrial fibrillation ([place])   . Bipolar 1 disorder ([place])   . 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 Cholelithiasis    a. 09/2016 s/p Lap Chole.  . Chronic bronchitis ([place])   . Chronic combined systolic and diastolic congestive heart failure ([place])    a. 09/2016 Echo: EF 30-35%.  . Colon polyps   . COPD (chronic obstructive pulmonary disease) ([place])   . Depression   . Family history of adverse reaction to anesthesia    Uncle was positive for malignant hyperthermia; patient had testing done and was negative.  . Fibromyalgia   . GERD (gastroesophageal  reflux disease)   . Gout   . High cholesterol   . History of blood transfusion 06/2015   "bleeding from my rectum"  . History of hiatal hernia   . HOH (hard of hearing)   . Hx of colonic polyps 03/21/2016   3 small adenomas no recall - co-morbidities  . Neuropathy    Disc Back   . NICM (nonischemic cardiomyopathy) ([place])    a. Previously worked up in [place], MD-->low EF with subsequent recovery.  Multiple caths (last ~ 2014 per pt report)--reportedly nl cors;  b. 09/2016 Echo: EF 30-35%, antsept/apical HK, mild MR, mildly dil LA, mod dil RA;  c. 09/2016 Lexi MV: EF 26%, glob HK, sept DK, med size, mod intensity fixed septal defect - BBB/PVC related artifact, no ischemia.  . On home oxygen therapy    "3L; 24/7" (03/14/2016)  . OSA treated with BiPAP    uses biPAP, 10 (03/14/2016)  . Osteoarthritis   . Oxygen deficiency   . Pneumonia   .  Type II diabetes mellitus ([place])     Past Surgical History:  Procedure Laterality Date  . APPENDECTOMY     "they busted"  . bladder stimulator     pt states, "it cannot be turned off; it's in my right hip; dead battery so it's not working anymore". (03/14/2016)  . BLADDER SUSPENSION     2003, 2006 and 2010  . CATARACT EXTRACTION W/PHACO Right 11/29/2014   Procedure: CATARACT EXTRACTION PHACO AND INTRAOCULAR LENS PLACEMENT (IOC);  Surgeon: Rutherford Guys, MD;  Location: [facility];  Service: Ophthalmology;  Laterality: Right;  CDE:3.81  . CATARACT EXTRACTION W/PHACO Left 12/13/2014   Procedure: CATARACT EXTRACTION PHACO AND INTRAOCULAR LENS PLACEMENT (IOC);  Surgeon: Rutherford Guys, MD;  Location: [facility];  Service: Ophthalmology;  Laterality: Left;  CDE:6.59  . CERVICAL DISC SURGERY N/A 2009   4, 6, and 7 cervical disc replaced  . CHOLECYSTECTOMY N/A 09/13/2016   Procedure: LAPAROSCOPIC CHOLECYSTECTOMY;  Surgeon: Rolm Bookbinder, MD;  Location: [place];  Service: General;  Laterality: N/A;  . COLONOSCOPY WITH PROPOFOL N/A 03/15/2016   Procedure: COLONOSCOPY  WITH PROPOFOL;  Surgeon: Gatha Mayer, MD;  Location: [place];  Service: Endoscopy;  Laterality: N/A;  . ESOPHAGOGASTRODUODENOSCOPY (EGD) WITH PROPOFOL N/A 03/15/2016   Procedure: ESOPHAGOGASTRODUODENOSCOPY (EGD) WITH PROPOFOL;  Surgeon: Gatha Mayer, MD;  Location: [place];  Service: Endoscopy;  Laterality: N/A;  . HEEL SPUR SURGERY Bilateral   . HERNIA REPAIR    . I&D EXTREMITY Right 06/13/2015   Procedure: MINOR IRRIGATION AND DEBRIDEMENT EXTREMITY REMOVAL OF NAIL;  Surgeon: Daryll Brod, MD;  Location: [place];  Service: Orthopedics;  Laterality: Right;  . TUBAL LIGATION    . UMBILICAL HERNIA REPAIR     w/mesh    FAMHx:  Family History  Problem Relation Age of Onset  . Heart disease Mother   . COPD Mother   . Diabetes Mother   . Breast cancer Mother   . Heart disease Father   . Hyperlipidemia Father   . COPD Sister   . Heart disease Sister   . Diabetes Sister   . Heart disease Maternal Grandmother     SOCHx:   reports that she quit smoking about 27 years ago. Her smoking use included Cigarettes. She has a 28.00 pack-year smoking history. She has never used smokeless tobacco. She reports that she does not drink alcohol or use drugs.  ALLERGIES:  Allergies  Allergen Reactions  . Xarelto [Rivaroxaban] Other (See Comments)    Internal bleeding  . Ancef [Cefazolin] Nausea And Vomiting  . Levaquin [Levofloxacin In D5w] Other (See Comments)    "afib"  . Lyrica [Pregabalin] Hives  . Tamiflu [Oseltamivir Phosphate] Other (See Comments)    "water blisters"  . Zoloft [Sertraline Hcl] Other (See Comments)    Jaw problems, jittery  . Augmentin [Amoxicillin-Pot Clavulanate] Itching  . Ciprofloxacin Itching and Nausea And Vomiting  . Haldol [Haloperidol] Other (See Comments)    Restless leg  . Nsaids Diarrhea  . Penicillins Itching, Nausea And Vomiting and Rash    Has patient had a PCN reaction causing immediate rash, facial/tongue/throat swelling,  SOB or lightheadedness with hypotension: Yes Has patient had a PCN reaction causing severe rash involving mucus membranes or skin necrosis: No Has patient had a PCN reaction that required hospitalization No Has patient had a PCN reaction occurring within the last 10 years: Yes If all of the above answers are "NO", then may proceed with Cephalosporin use.  . Topamax [  Topiramate] Nausea Only    ROS: Pertinent items noted in HPI and remainder of comprehensive ROS otherwise negative.  HOME MEDS: Current Outpatient Prescriptions  Medication Sig Dispense Refill  . albuterol (PROVENTIL) (2.5 MG/3ML) 0.083% nebulizer solution Take 2.5 mg by nebulization every 6 (six) hours as needed for wheezing or shortness of breath.    Marland Kitchen atorvastatin (LIPITOR) 40 MG tablet Take 1 tablet (40 mg total) by mouth daily. 90 tablet 3  . beta carotene 25000 UNIT capsule Take 25,000 Units by mouth daily.    . cetirizine (ZYRTEC) 10 MG tablet Take 10 mg by mouth at bedtime.    . cholecalciferol (VITAMIN D) 400 units TABS tablet Take 1 tablet (400 Units total) by mouth 2 (two) times daily. 30 each 2  . docusate sodium (COLACE) 100 MG capsule Take 100 mg by mouth daily.    Marland Kitchen escitalopram (LEXAPRO) 10 MG tablet Take 10 mg by mouth at bedtime.     . fenofibrate 54 MG tablet Take 1 tablet (54 mg total) by mouth daily. 90 tablet 3  . ferrous sulfate 324 (65 Fe) MG TBEC Take 1 tablet by mouth daily.    . fluticasone (FLONASE) 50 MCG/ACT nasal spray Place 1 spray into both nostrils daily as needed for allergies or rhinitis. 32 g 6  . furosemide (LASIX) 40 MG tablet Take 2 tablets (80 mg total) by mouth 2 (two) times daily. 360 tablet 3  . Insulin Glargine (BASAGLAR KWIKPEN) 100 UNIT/ML SOPN Inject 25 units into the skin daily (Patient taking differently: Inject 40 Units into the skin at bedtime. Inject 25 units into the skin daily) 25 pen 0  . insulin lispro (HUMALOG KWIKPEN) 100 UNIT/ML KiwkPen CBG 70-120: 0 units; 121-150: 1  U; 151-200: 2 U; 201-250: 3 U; 251-300: 5 U; 301-350: 7 U; 351-400: 9 U 35 pen 3  . ipratropium (ATROVENT) 0.02 % nebulizer solution Take 0.5 mg by nebulization every 6 (six) hours as needed for wheezing or shortness of breath.    . metFORMIN (GLUCOPHAGE-XR) 500 MG 24 hr tablet Take 1 tablet (500 mg total) by mouth 2 (two) times daily. 180 tablet 2  . metolazone (ZAROXOLYN) 2.5 MG tablet Take 1 tablet (2.5 mg total) by mouth daily. 90 tablet 3  . metoprolol succinate (TOPROL-XL) 25 MG 24 hr tablet Take 1 tablet (25 mg total) by mouth 2 (two) times daily. 180 tablet 3  . mirtazapine (REMERON) 15 MG tablet Take 15 mg by mouth at bedtime.    . modafinil (PROVIGIL) 200 MG tablet Take 1 tablet (200 mg total) by mouth daily. 30 tablet 5  . montelukast (SINGULAIR) 10 MG tablet Take 1 tablet (10 mg total) by mouth at bedtime. 30 tablet 6  . nitroGLYCERIN (NITROSTAT) 0.4 MG SL tablet Place 1 tablet (0.4 mg total) under the tongue every 5 (five) minutes as needed for chest pain. 25 tablet 3  . nystatin (NYSTATIN) powder Apply topically 2 (two) times daily. Apply under breasts 15 g 3  . ondansetron (ZOFRAN-ODT) 4 MG disintegrating tablet Take 1 tablet (4 mg total) by mouth 2 (two) times daily as needed for nausea or vomiting. 30 tablet 1  . oxyCODONE-acetaminophen (PERCOCET/ROXICET) 5-325 MG tablet Take 1 tablet by mouth every 8 (eight) hours as needed for severe pain.    . OXYGEN Inhale 3 L into the lungs continuous. 3 liters 24/7    . pantoprazole (PROTONIX) 20 MG tablet Take 1 tablet (20 mg total) by mouth 2 (two) times daily  before a meal. 180 tablet 1  . PRESCRIPTION MEDICATION Inhale into the lungs See admin instructions. Use BIPAP every time laying down    . primidone (MYSOLINE) 50 MG tablet Take 1 tablet (50 mg total) by mouth at bedtime. 90 tablet 3  . Probiotic Product (PROBIOTIC PO) Take 1 tablet by mouth at bedtime.    Marland Kitchen QUEtiapine (SEROQUEL) 25 MG tablet Take 25 mg by mouth at bedtime.  0  .  triamcinolone lotion (KENALOG) 0.1 % Apply 1 application topically 3 (three) times daily. For up to 14 days and as needed 60 mL 1  . warfarin (COUMADIN) 4 MG tablet Take 1 tablet daily except 1/2 tablet on Tuesdays 30 tablet 3  . losartan (COZAAR) 50 MG tablet Take 1 tablet (50 mg total) by mouth daily. 90 tablet 3   No current facility-administered medications for this visit.     LABS/IMAGING: No results found for this or any previous visit (from the past 48 hour(s)). No results found.  VITALS: BP (!) 106/52   Pulse 72   Wt 190 lb (86.2 kg)   BMI 38.38 kg/m   EXAM: General appearance: alert, appears older than stated age, no distress and In a powered scooter Neck: JVD - 3 cm above sternal notch and no carotid bruit Lungs: diminished breath sounds bilaterally Heart: irregularly irregular rhythm Abdomen: soft, non-tender; bowel sounds normal; no masses,  no organomegaly and obese Extremities: edema 1+ edema Pulses: 2+ and symmetric Skin: Numerous, weeping lesions Neurologic: Grossly normal Psych: pleasant  EKG: Deferred  ASSESSMENT: 1. Permanent atrial fibrillation - not on anticoagulation due to recent GI bleeding and vision loss attributed to stroke 2. Acute on Chronic combined congestive heart failure - EF 30-35% 3. History of stress-induced cardiomyopathy 4. No significant obstructive coronary disease after multiple catheterizations in 1996, 99, 2007 and 2014. 5. Fibromyalgia 6. Bipolar 1 disorder 7. Dyslipidemia 8. Hypertension 9. Severe COPD on home oxygen 10. Obstructive sleep apnea on CPAP 11. Morbid obesity - with weight loss recently 12. DNR  PLAN: 1.   Mrs. Latchford presents today with 10 pound weight gain and what appears to be acute on chronic combined congestive heart failure. Recent EF was 30-35%. She was started on BiDil actually has a low normal blood pressure. Renal function has recovered though, and therefore she would benefit more from the addition of  ARB. She is ready on metoprolol. She has significant problem affording medications and Entresto may not likely be affordable for her. I will discontinue BiDil and start losartan 50 mg daily. Will also increase her metolazone to 2.5 mg daily in addition to her Lasix. Repeat metabolic profile one week and follow-up with me in one month.  Pixie Casino, MD, [facility] Attending Cardiologist [place] 10/30/2016, 4:45 PM

## 2016-11-01 ENCOUNTER — Other Ambulatory Visit: Payer: Self-pay | Admitting: Pharmacist

## 2016-11-01 NOTE — Patient Outreach (Signed)
[facility]) Care Management  11/01/2016  Smantha Boakye [facility] 1950-10-15 [id number]   Called patient to follow up on pill packing.  HIPAA identifiers were obtained. Patient reported she is doing well with the pill packs from [facility].  Patient saw [name] yesterday.  Bidil was discontinued, Metolazone 2.5mg  was increased to 1 tablet daily and Losartan 50mg  1 tablet daily was added.  [facility] is aware of the changes and will be delivering Losartan 50mg  to her in a medication bottle until it is time for her next pill pack.   Plan:  Follow up with the patient after she gets her next pill pack.  [name], PharmD, [place] Clinical Pharmacist [phone]

## 2016-11-04 ENCOUNTER — Telehealth: Payer: Self-pay | Admitting: Family Medicine

## 2016-11-04 ENCOUNTER — Encounter: Payer: Self-pay | Admitting: Internal Medicine

## 2016-11-04 ENCOUNTER — Ambulatory Visit (INDEPENDENT_AMBULATORY_CARE_PROVIDER_SITE_OTHER): Payer: Medicare Other | Admitting: Internal Medicine

## 2016-11-04 VITALS — BP 110/60 | HR 64 | Temp 98.1°F | Ht 59.0 in | Wt 191.0 lb

## 2016-11-04 DIAGNOSIS — R251 Tremor, unspecified: Secondary | ICD-10-CM | POA: Diagnosis not present

## 2016-11-04 DIAGNOSIS — E876 Hypokalemia: Secondary | ICD-10-CM

## 2016-11-04 DIAGNOSIS — E785 Hyperlipidemia, unspecified: Secondary | ICD-10-CM

## 2016-11-04 DIAGNOSIS — Z760 Encounter for issue of repeat prescription: Secondary | ICD-10-CM

## 2016-11-04 DIAGNOSIS — I5022 Chronic systolic (congestive) heart failure: Secondary | ICD-10-CM

## 2016-11-04 DIAGNOSIS — I5042 Chronic combined systolic (congestive) and diastolic (congestive) heart failure: Secondary | ICD-10-CM

## 2016-11-04 DIAGNOSIS — K219 Gastro-esophageal reflux disease without esophagitis: Secondary | ICD-10-CM

## 2016-11-04 NOTE — Patient Instructions (Signed)
Ms. Kaman,  I have placed orders for the hospital bed. This process may take some time. You may be contacted by [place] as part of the process.  I will order your medications to your new pharmacy.  Best, Dr. Ola Spurr

## 2016-11-05 DIAGNOSIS — Z760 Encounter for issue of repeat prescription: Secondary | ICD-10-CM | POA: Insufficient documentation

## 2016-11-05 DIAGNOSIS — I5022 Chronic systolic (congestive) heart failure: Secondary | ICD-10-CM | POA: Insufficient documentation

## 2016-11-05 LAB — BASIC METABOLIC PANEL
BUN / CREAT RATIO: 44 — AB (ref 12–28)
BUN: 59 mg/dL — AB (ref 8–27)
CO2: 34 mmol/L — ABNORMAL HIGH (ref 18–29)
CREATININE: 1.35 mg/dL — AB (ref 0.57–1.00)
Calcium: 9.3 mg/dL (ref 8.7–10.3)
Chloride: 92 mmol/L — ABNORMAL LOW (ref 96–106)
GFR, EST AFRICAN AMERICAN: 48 mL/min/{1.73_m2} — AB (ref >59)
GFR, EST NON AFRICAN AMERICAN: 41 mL/min/{1.73_m2} — AB (ref >59)
Glucose: 103 mg/dL — ABNORMAL HIGH (ref 65–99)
Potassium: 4.3 mmol/L (ref 3.5–5.2)
Sodium: 143 mmol/L (ref 134–144)

## 2016-11-05 MED ORDER — CHOLECALCIFEROL 10 MCG (400 UNIT) PO TABS: 400.0000 [IU] | ORAL_TABLET | Freq: Two times a day (BID) | ORAL | 3 refills | Status: DC

## 2016-11-05 MED ORDER — PRIMIDONE 50 MG PO TABS: 50.0000 mg | ORAL_TABLET | Freq: Every day | ORAL | 3 refills | Status: DC

## 2016-11-05 MED ORDER — ATORVASTATIN CALCIUM 40 MG PO TABS: 40.0000 mg | ORAL_TABLET | Freq: Every day | ORAL | 3 refills | Status: DC

## 2016-11-05 MED ORDER — PANTOPRAZOLE SODIUM 20 MG PO TBEC: 20.0000 mg | DELAYED_RELEASE_TABLET | Freq: Two times a day (BID) | ORAL | 1 refills | Status: DC

## 2016-11-05 MED ORDER — ONDANSETRON 4 MG PO TBDP: 4.0000 mg | ORAL_TABLET | Freq: Two times a day (BID) | ORAL | 0 refills | Status: DC | PRN

## 2016-11-05 NOTE — Assessment & Plan Note (Signed)
-   Refilled requested medications as above for GERD, hx of vitamin D deficiency, HLD, tremor and occasional nausea

## 2016-11-05 NOTE — Assessment & Plan Note (Signed)
-   Stable. - Ordered BMP. Will route results to [name]. - Placed order for hospital bed DME to improve symptoms with sleep.

## 2016-11-05 NOTE — Progress Notes (Signed)
[name] Progress Note  Subjective:  Denise Freeman is a 66 y.o. with history of CHF, T2DM, chronic back pain, tremor and GERD who presents for medication refills and concern about hospital bed.  #Medication refills: - Has been having trouble with medication costs so changed pharmacies. Wants medications to go to [facility] from now on.  - Needs lipitor, vitamin D, zofran prn, protonix, and primidone prescriptions sent there.   #Hospital bed issue: - Current hospital bed is not performing well for her. Says she has had it about 8 years and never was supportive enough. - Mattress sinks in and does not support her hips - Needs feet to be elevated to help with LE swelling and to be able to elevate her head due to CHF to help with orthopnea  #CHF: Of note, patient saw her Cardiologist [name] last week who changed her medications by increasing metolazone dose and changing bidil to losartan. He wanted BMP checked this week. Patient prefers to have this done today to save trip to [place] (lives in [place]).  ROS: No chest pain, no vomiting  Objective: Blood pressure 110/60, pulse 64, temperature 98.1 F (36.7 C), temperature source Oral, height 4\' 11"  (1.499 m), weight 191 lb (86.6 kg), SpO2 99 %. Body mass index is 38.58 kg/m. Constitutional: Obese female, [place] in place, in NAD HENT: Edentulous Cardiovascular: RRR, S1, S2, no m/r/g.  Pulmonary/Chest: Able to speak quickly in complete sentences. Decreased breath sounds 2/2 habitus.  Neurological: AOx3, no focal deficits. Vitals reviewed  Assessment/Plan: Medication refill - Refilled requested medications as above for GERD, hx of vitamin D deficiency, HLD, tremor and occasional nausea  Chronic systolic congestive heart failure (HCC) - Stable. - Ordered BMP. Will route results to [name]. - Placed order for hospital bed DME to improve symptoms with sleep.   Follow-up in about 3  months.  [name], MD [place], PGY-2

## 2016-11-07 ENCOUNTER — Other Ambulatory Visit: Payer: Self-pay | Admitting: *Deleted

## 2016-11-07 NOTE — Patient Outreach (Addendum)
[place] [facility]) Care Management   11/07/2016  Damonica Chopra [facility] [birth year] [id number]  Denise Freeman is an 66 y.o. 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   Mrs Suman was referred to [place] community coordinator on 09/16/16 by [facility] hospital liaison [facility]), V Brewer,for Transition of care services after her 09/14/16 hospital discharge for RUQ abdominal pain with s/p high risk laparoscopic cholecystectomy on 09/13/16 for acute cholecystitis without biliary dilation.   She is now being followed for complex [facility] CM services.  This is the fourth home visit for Mrs Leiterman   Subjective:   Objective:   Review of Systems  Constitutional: Negative for chills, diaphoresis, fever, malaise/fatigue and weight loss.  HENT: Negative for congestion, ear discharge, ear pain, hearing loss, nosebleeds, sinus pain, sore throat and tinnitus.   Eyes: Positive for blurred vision. Negative for double vision, photophobia, pain, discharge and redness.  Respiratory: Positive for shortness of breath. Negative for cough, hemoptysis, sputum production, wheezing and stridor.        Minimal SOB  Cardiovascular: Negative for chest pain, palpitations, orthopnea, claudication, leg swelling and PND.  Gastrointestinal: Negative for abdominal pain, blood in stool, constipation, diarrhea, heartburn, melena, nausea and vomiting.  Genitourinary: Negative for dysuria, flank pain, frequency, hematuria and urgency.  Musculoskeletal: Negative for back pain, falls, joint pain, myalgias and neck pain.  Skin: Negative for itching and rash.  Neurological: Negative for dizziness, tingling, tremors, sensory change,  speech change, focal weakness, seizures, loss of consciousness, weakness and headaches.  Endo/Heme/Allergies: Negative for environmental allergies and polydipsia. Does not bruise/bleed easily.  Psychiatric/Behavioral: Negative for depression, hallucinations, memory loss, substance abuse and suicidal ideas. The patient is not nervous/anxious and does not have insomnia.     Physical Exam  Constitutional: She is oriented to person, place, and time. She appears well-developed and well-nourished.  HENT:  Head: Normocephalic and atraumatic.  Right Ear: External ear normal.  Left Ear: External ear normal.  Eyes: Conjunctivae and EOM are normal. Pupils are equal, round, and reactive to light.  Neck: Normal range of motion. Neck supple.  Cardiovascular: Normal rate and regular rhythm.   Respiratory: Effort normal and breath sounds normal.  GI: Soft. Bowel sounds are normal.  Musculoskeletal: Normal range of motion.  Neurological: She is alert and oriented to person, place, and time.  Skin: Skin is warm and dry.  Psychiatric: She has a normal mood and affect. Her behavior is normal. Judgment and thought content normal.    Encounter Medications:   Outpatient Encounter Prescriptions as of 11/07/2016  Medication Sig Note  . albuterol (PROVENTIL) (2.5 MG/3ML) 0.083% nebulizer solution Take 2.5 mg by nebulization every 6 (six) hours as needed for wheezing or shortness of breath.   Marland Kitchen atorvastatin (LIPITOR) 40 MG tablet Take 1 tablet (40 mg total) by mouth daily.   . beta carotene 25000 UNIT capsule Take 25,000 Units by mouth daily.   . cetirizine (ZYRTEC) 10 MG tablet Take 10 mg by mouth at bedtime.   . cholecalciferol (VITAMIN D) 400 units TABS tablet Take 1 tablet (400 Units total) by mouth 2 (two) times daily.   Marland Kitchen docusate sodium (COLACE) 100 MG capsule Take 100 mg by mouth daily.   Marland Kitchen  escitalopram (LEXAPRO) 10 MG tablet Take 10 mg by mouth at bedtime.    . fenofibrate 54 MG tablet Take 1 tablet (54  mg total) by mouth daily.   . ferrous sulfate 324 (65 Fe) MG TBEC Take 1 tablet by mouth daily.   . fluticasone (FLONASE) 50 MCG/ACT nasal spray Place 1 spray into both nostrils daily as needed for allergies or rhinitis.   . furosemide (LASIX) 40 MG tablet Take 2 tablets (80 mg total) by mouth 2 (two) times daily.   . Insulin Glargine (BASAGLAR KWIKPEN) 100 UNIT/ML SOPN Inject 25 units into the skin daily (Patient taking differently: Inject 40 Units into the skin at bedtime. Inject 25 units into the skin daily)   . insulin lispro (HUMALOG KWIKPEN) 100 UNIT/ML KiwkPen CBG 70-120: 0 units; 121-150: 1 U; 151-200: 2 U; 201-250: 3 U; 251-300: 5 U; 301-350: 7 U; 351-400: 9 U 09/23/2016: Patient said she is injecting 24 units in the AM, 28 units at lunch and 28 units at bedtime (uses sliding scale when blood sugar is very high)  . ipratropium (ATROVENT) 0.02 % nebulizer solution Take 0.5 mg by nebulization every 6 (six) hours as needed for wheezing or shortness of breath.   . losartan (COZAAR) 50 MG tablet Take 1 tablet (50 mg total) by mouth daily.   . metFORMIN (GLUCOPHAGE-XR) 500 MG 24 hr tablet Take 1 tablet (500 mg total) by mouth 2 (two) times daily.   . metolazone (ZAROXOLYN) 2.5 MG tablet Take 1 tablet (2.5 mg total) by mouth daily.   . metoprolol succinate (TOPROL-XL) 25 MG 24 hr tablet Take 1 tablet (25 mg total) by mouth 2 (two) times daily.   . mirtazapine (REMERON) 15 MG tablet Take 15 mg by mouth at bedtime.   . modafinil (PROVIGIL) 200 MG tablet Take 1 tablet (200 mg total) by mouth daily.   . montelukast (SINGULAIR) 10 MG tablet Take 1 tablet (10 mg total) by mouth at bedtime.   . nitroGLYCERIN (NITROSTAT) 0.4 MG SL tablet Place 1 tablet (0.4 mg total) under the tongue every 5 (five) minutes as needed for chest pain.   Marland Kitchen nystatin (NYSTATIN) powder Apply topically 2 (two) times daily. Apply under breasts   . ondansetron (ZOFRAN-ODT) 4 MG disintegrating tablet Take 1 tablet (4 mg total) by  mouth 2 (two) times daily as needed for nausea or vomiting.   Marland Kitchen oxyCODONE-acetaminophen (PERCOCET/ROXICET) 5-325 MG tablet Take 1 tablet by mouth every 8 (eight) hours as needed for severe pain.   . OXYGEN Inhale 3 L into the lungs continuous. 3 liters 24/7   . pantoprazole (PROTONIX) 20 MG tablet Take 1 tablet (20 mg total) by mouth 2 (two) times daily before a meal.   . PRESCRIPTION MEDICATION Inhale into the lungs See admin instructions. Use BIPAP every time laying down   . primidone (MYSOLINE) 50 MG tablet Take 1 tablet (50 mg total) by mouth at bedtime.   . Probiotic Product (PROBIOTIC PO) Take 1 tablet by mouth at bedtime.   Marland Kitchen QUEtiapine (SEROQUEL) 25 MG tablet Take 25 mg by mouth at bedtime.   . triamcinolone lotion (KENALOG) 0.1 % Apply 1 application topically 3 (three) times daily. For up to 14 days and as needed   . warfarin (COUMADIN) 4 MG tablet Take 1 tablet daily except 1/2 tablet on Tuesdays    No facility-administered encounter medications on file as of 11/07/2016.     Functional Status:   In your present state of health, do  you have any difficulty performing the following activities: 10/16/2016 09/23/2016  Hearing? [name]  Vision? Y Y  Difficulty concentrating or making decisions? Y N  Walking or climbing stairs? Y Y  Dressing or bathing? N N  Doing errands, shopping? N Y  Some recent data might be hidden    Fall/Depression Screening:    Fall Risk  11/04/2016 10/16/2016 09/24/2016  Falls in the past year? No Yes -  Number falls in past yr: - 2 or more -  Injury with Fall? - Yes -  Risk Factor Category  High Fall Risk High Fall Risk -  Risk for fall due to : - History of fall(s);Impaired mobility;Impaired vision Impaired balance/gait;Impaired mobility;Impaired vision;Medication side effect  Follow up - Education provided -   [facility] 2/9 Scores 11/04/2016 10/16/2016 09/06/2016 07/18/2016 06/07/2016 05/02/2016 04/19/2016  PHQ - 2 Score 0 2 0 0 0 1 0  PHQ- 9 Score - 9 - - - 5 -     Assessment:    Mrs Sada presented at her kitchen table scheduling appointments with [facility] for her and husband for future appointments   Had medication changes No longer taking, Bidil, now, taking losartan 50 mg and metolazone increase to "whole tablet"  She is awaiting changes in her blister packs from the pharmacy deliver company  States she is getting her mobile scooter evaluated for bearing issue and noise noted in the back  Gave her extra copies of bp monitoring, cbg monitoring and weight labs sheets for hr calendar Gave her the National do not call registry number   Chronic Medical issues  Dr Debara Pickett 10/30/16 seen for afib Dr Deidre Ala concern because labs varies related decrease of "water pill" Goes back to see [facility] on 10/29/16 at 1425 states Dr Debara Pickett states edema of legs not a good indicator for her CHF because of hx of neuropathy and a more indicating chf symptom for her is her breathing, sob, wheezing symptoms.  Discussed these symptoms in relation to the CHF zones in her [facility] calendar (indicating to call pcp or cv for yellow zone symptoms and to go to Urgent care or ED for red zone symptoms  Discussed her anxiety and why she repeats most phrases Pt reports she has anxiety related loss of various family members and difficulty with memory She is aware of need to take her behavioral medications and to be allowed to ventilate her  See Counselor, Dr Rosine Door, again on January 13 2017  Plan:  Martin Majestic over the Three questions to ask each medical provider with her in her [facility] Calendar to use when I she goes to each doctor  [facility] CM Care Plan Problem One     Most Recent Value  Care Plan Problem One  (P) CHF home management deficit   Role Documenting the Problem One  (P) Care Management [place] for Problem One  (P) Active  [facility] Long Term Goal   (P) over the next 45 days the patient will be able to verbalize home care management interventions for CHF  [facility]N Long Term Goal Start Date   (P) 09/18/16  Interventions for Problem One Long Term Goal  (P) Review CHF diagnosis, CHF zones, medications, diet and activity  [facility] CM Short Term Goal #1   (P) over the next 7 days the patietn will receive her portable oxygen and tubing from [facility]  [facility] CM Short Term Goal #1 Start Date  (P) 09/18/16  [facility] CM Short Term Goal #1 Met Date  (  P) 09/23/16  Interventions for Short Term Goal #1  (P) contact [facility] to request service for DME  [facility] CM Short Term Goal #2   (P) Over the next 7-14 days the patient will have decreased CHF symptoms by evidence of decreased lower extremity edema and decreased soc  [facility] CM Short Term Goal #2 Start Date  (P) 09/18/16  [facility] CM Short Term Goal #2 Met Date  (P) 09/23/16  Interventions for Short Term Goal #2  (P) Assessment of CHF symptoms, call to CV office to report abnormal symptoms and request medcation treatment and hospital folllow up appointments, monitor and evaluation treatment plan   [facility]N CM Short Term Goal #3  (P) over the next 30 days the patient will be seen by [facility] pharmacy staff to review pharmacy discrepancies and offer resolutions  [facility] CM Short Term Goal #3 Start Date  (P) 09/23/16  [facility] CM Short Term Goal #3 Met Date  (P) 10/25/16  Interventions for Short [place] Goal #3  (P) referral to [place], review of medications, offer new pill box, monitor and evaluation pharmacy interventions       Kimberly L. Lavina Hamman, RN, BSN, [place] Care Management [phone]

## 2016-11-07 NOTE — Patient Outreach (Signed)
[place] [facility]) Care Management  11/07/2016  Denise Freeman [facility] 1950/09/11 [id number]   CSW spoke with patient via phone today as follow-up from initial home visit. Patient reports that she is doing well, but having issues making arrangements for transportation. CSW provided patient with phone number & contact for Aging, [place] who can assist with transportation through [facility] ([place]). Patient states that she has not yet had a chance to look over the advance directive packet but plans to do that before her next doctors appointment. Patient also states that when her husband does have a medical procedure in the next few weeks that they would need assistance for light housekeeping - CSW reminded patient of the resources given by CSW at the initial home visit of the [place] through [facility]. Patient plans to call Waymond Cera with [facility] to review services, CSW will call back in 2 weeks to ensure that services have been setup.    Raynaldo Opitz, LCSW [facility]  Clinical Social Worker cell #: [phone]

## 2016-11-08 ENCOUNTER — Other Ambulatory Visit: Payer: Self-pay | Admitting: Family Medicine

## 2016-11-08 DIAGNOSIS — I5022 Chronic systolic (congestive) heart failure: Secondary | ICD-10-CM

## 2016-11-11 ENCOUNTER — Ambulatory Visit (INDEPENDENT_AMBULATORY_CARE_PROVIDER_SITE_OTHER): Payer: Medicare Other | Admitting: *Deleted

## 2016-11-11 DIAGNOSIS — I480 Paroxysmal atrial fibrillation: Secondary | ICD-10-CM | POA: Diagnosis not present

## 2016-11-11 DIAGNOSIS — I4891 Unspecified atrial fibrillation: Secondary | ICD-10-CM

## 2016-11-11 LAB — POCT INR: INR: 3.5

## 2016-11-13 ENCOUNTER — Other Ambulatory Visit: Payer: Self-pay | Admitting: Pharmacist

## 2016-11-13 NOTE — Patient Outreach (Addendum)
[place] [facility]) Care Management  11/13/2016  Disa Riedlinger [facility] 08-08-1950 [id number]   Called patient to follow up on her pill packs.  No answer.  HIPAA compliant message left on her voicemail.     Plan:    Call the patient back in 1 week to follow up. Close pharmacy case if all is well with the packs.  [name], PharmD, [place] Clinical Pharmacist [phone]  ADDENDUM  Patient called me back.  HIPAA identifiers were obtained.  Patient said her pill packs were delivered yesterday and she is doing well with them.  She understands how to use them and is very happy with the service.    Plan:  Call patient back in 1 month for a final follow up.   Close pharmacy case in 1 month.  [name], PharmD, [place] Clinical Pharmacist [phone]

## 2016-11-19 DIAGNOSIS — G894 Chronic pain syndrome: Secondary | ICD-10-CM | POA: Diagnosis not present

## 2016-11-19 DIAGNOSIS — M5136 Other intervertebral disc degeneration, lumbar region: Secondary | ICD-10-CM | POA: Diagnosis not present

## 2016-11-20 ENCOUNTER — Ambulatory Visit: Payer: Medicare Other | Admitting: *Deleted

## 2016-11-20 ENCOUNTER — Other Ambulatory Visit: Payer: Self-pay | Admitting: *Deleted

## 2016-11-20 ENCOUNTER — Ambulatory Visit: Payer: Medicare Other | Admitting: Pharmacist

## 2016-11-21 ENCOUNTER — Ambulatory Visit: Payer: Medicare Other | Admitting: Internal Medicine

## 2016-11-21 ENCOUNTER — Other Ambulatory Visit: Payer: Self-pay | Admitting: *Deleted

## 2016-11-21 NOTE — Patient Outreach (Signed)
[place] [facility]) Care Management  11/21/2016  Denise Freeman [facility] November 26, 1950 [id number]   CSW received call back from patient that her & her husband had to run her pain script to the pharmacy yesterday & missed my call. Patient reports that she has been doing well, her husband needs to schedule an appointment for surgery on his hand which would require them to need transportation as he drives them to all her appointments. CSW had provided her with the phone number to [facility] though she states that she has had a bad experience with them being late/forgetting to pick her up in the past. CSW encouraged her to call them to voice her concerns with them as they are the most reasonably priced transportation in the county since they are a service through [place], [place] in [place]. CSW also provided patient with phone numbers to [facility] [employer], [place]. Patient states that she will call to make the appointment for her husband first then call around to see who could transport them.   CSW will call back in 3 weeks to ensure that transportation has been arranged.    Raynaldo Opitz, LCSW [facility]  Clinical Social Worker cell #: [phone]

## 2016-11-21 NOTE — Patient Outreach (Signed)
[facility]) Care Management   11/21/2016  Denise Freeman [facility] 1950/09/22 [id number]  Denise Freeman is an 66 y.o. 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   Denise Freeman was referred to [place] community coordinator on 09/16/16 by [facility] hospital liaison [facility]), V Brewer,for Transition of care services after her 09/14/16 hospital discharge for RUQ abdominal pain with s/p high risk laparoscopic cholecystectomy on 09/13/16 for acute cholecystitis without biliary dilation.   She is now being followed for complex [facility] CM services.  This is the fifth home visit for Denise Freeman   Subjective: "I got lots of issues today"  Objective:   BP 110/60   Pulse 78   SpO2 98%  Review of Systems  HENT: Negative.   Eyes: Negative.   Cardiovascular: Negative.   Gastrointestinal: Negative.   Genitourinary: Negative.   Musculoskeletal: Negative.   Skin: Negative.   Neurological: Positive for weakness. Negative for dizziness, tingling, tremors, sensory change, speech change, focal weakness, seizures, loss of consciousness and headaches.  Endo/Heme/Allergies: Bruises/bleeds easily.  Psychiatric/Behavioral: Negative.     Physical Exam  Constitutional: She is oriented to person, place, and time. She appears well-developed and well-nourished.  HENT:  Head: Normocephalic and atraumatic.  Eyes: Pupils are equal, round, and reactive to light. Conjunctivae are normal.  Neck: Normal range of motion. Neck supple.  Respiratory: Effort normal.  GI: Soft. Bowel sounds are normal.  Musculoskeletal: Normal range of motion.  Neurological: She is alert and oriented to person, place, and time.   Skin: Skin is warm.  Psychiatric: She has a normal mood and affect. Her behavior is normal. Judgment and thought content normal.    Encounter Medications:   Outpatient Encounter Prescriptions as of 11/21/2016  Medication Sig Note  . albuterol (PROVENTIL) (2.5 MG/3ML) 0.083% nebulizer solution Take 2.5 mg by nebulization every 6 (six) hours as needed for wheezing or shortness of breath.   Marland Kitchen atorvastatin (LIPITOR) 40 MG tablet Take 1 tablet (40 mg total) by mouth daily.   . beta carotene 25000 UNIT capsule Take 25,000 Units by mouth daily.   . cetirizine (ZYRTEC) 10 MG tablet Take 10 mg by mouth at bedtime.   . cholecalciferol (VITAMIN D) 400 units TABS tablet Take 1 tablet (400 Units total) by mouth 2 (two) times daily.   Marland Kitchen docusate sodium (COLACE) 100 MG capsule Take 100 mg by mouth daily.   Marland Kitchen escitalopram (LEXAPRO) 10 MG tablet Take 10 mg by mouth at bedtime.    . fenofibrate 54 MG tablet Take 1 tablet (54 mg total) by mouth daily.   . ferrous sulfate 324 (65 Fe) MG TBEC Take 1 tablet by mouth daily.   . fluticasone (FLONASE) 50 MCG/ACT nasal spray Place 1 spray into both nostrils daily as needed for allergies or rhinitis.   . furosemide (LASIX) 40 MG tablet Take 2 tablets (80 mg total) by mouth 2 (two) times daily.   . Insulin Glargine (BASAGLAR KWIKPEN) 100 UNIT/ML SOPN Inject 25 units into the skin daily (Patient taking differently: Inject 40 Units into the skin at bedtime. Inject 25 units into the skin daily)   . insulin lispro (HUMALOG KWIKPEN) 100 UNIT/ML KiwkPen CBG 70-120: 0 units; 121-150: 1 U;  151-200: 2 U; 201-250: 3 U; 251-300: 5 U; 301-350: 7 U; 351-400: 9 U 09/23/2016: Patient said she is injecting 24 units in the AM, 28 units at lunch and 28 units at bedtime (uses sliding scale when blood sugar is very high)  . ipratropium (ATROVENT) 0.02 % nebulizer solution Take 0.5 mg by nebulization every 6 (six) hours as needed for wheezing or shortness of breath.   . losartan (COZAAR) 50 MG  tablet Take 1 tablet (50 mg total) by mouth daily.   . metFORMIN (GLUCOPHAGE-XR) 500 MG 24 hr tablet Take 1 tablet (500 mg total) by mouth 2 (two) times daily.   . metolazone (ZAROXOLYN) 2.5 MG tablet Take 1 tablet (2.5 mg total) by mouth daily.   . metoprolol succinate (TOPROL-XL) 25 MG 24 hr tablet Take 1 tablet (25 mg total) by mouth 2 (two) times daily.   . mirtazapine (REMERON) 15 MG tablet Take 15 mg by mouth at bedtime.   . modafinil (PROVIGIL) 200 MG tablet Take 1 tablet (200 mg total) by mouth daily.   . montelukast (SINGULAIR) 10 MG tablet Take 1 tablet (10 mg total) by mouth at bedtime.   . nitroGLYCERIN (NITROSTAT) 0.4 MG SL tablet Place 1 tablet (0.4 mg total) under the tongue every 5 (five) minutes as needed for chest pain.   Marland Kitchen nystatin (NYSTATIN) powder Apply topically 2 (two) times daily. Apply under breasts   . ondansetron (ZOFRAN-ODT) 4 MG disintegrating tablet Take 1 tablet (4 mg total) by mouth 2 (two) times daily as needed for nausea or vomiting.   Marland Kitchen oxyCODONE-acetaminophen (PERCOCET/ROXICET) 5-325 MG tablet Take 1 tablet by mouth every 8 (eight) hours as needed for severe pain.   . OXYGEN Inhale 3 L into the lungs continuous. 3 liters 24/7   . pantoprazole (PROTONIX) 20 MG tablet Take 1 tablet (20 mg total) by mouth 2 (two) times daily before a meal.   . PRESCRIPTION MEDICATION Inhale into the lungs See admin instructions. Use BIPAP every time laying down   . primidone (MYSOLINE) 50 MG tablet Take 1 tablet (50 mg total) by mouth at bedtime.   . Probiotic Product (PROBIOTIC PO) Take 1 tablet by mouth at bedtime.   Marland Kitchen QUEtiapine (SEROQUEL) 25 MG tablet Take 25 mg by mouth at bedtime.   . triamcinolone lotion (KENALOG) 0.1 % Apply 1 application topically 3 (three) times daily. For up to 14 days and as needed   . warfarin (COUMADIN) 4 MG tablet Take 1 tablet daily except 1/2 tablet on Tuesdays    No facility-administered encounter medications on file as of 11/21/2016.      Functional Status:   In your present state of health, do you have any difficulty performing the following activities: 10/16/2016 09/23/2016  Hearing? [name]  Vision? Y Y  Difficulty concentrating or making decisions? Y N  Walking or climbing stairs? Y Y  Dressing or bathing? N N  Doing errands, shopping? N Y  Some recent data might be hidden    Fall/Depression Screening:    Fall Risk  11/04/2016 10/16/2016 09/24/2016  Falls in the past year? No Yes -  Number falls in past yr: - 2 or more -  Injury with Fall? - Yes -  Risk Factor Category  High Fall Risk High Fall Risk -  Risk for fall due to : - History of fall(s);Impaired mobility;Impaired vision Impaired balance/gait;Impaired mobility;Impaired vision;Medication side effect  Follow up - Education provided -   [facility] 2/9 Scores 11/04/2016 10/16/2016 09/06/2016  07/18/2016 06/07/2016 05/02/2016 04/19/2016  PHQ - 2 Score 0 2 0 0 0 1 0  PHQ- 9 Score - 9 - - - 5 -    Assessment:     HIPAA verified Presents today in her hooveround with oxygen intact Pt is voices concerns today about a letter from [employer] requesting more tax information Referred to [facility] SW/Pharmacy  Acute Medical Conditions: Podiatry Abagail Kitchens per Denise Freeman is not assisting with DM shoes  Recommend asking pcp for assist with Rx for DM shoes vs podiatrist    Medications/DME Did get new hospital bed that is helping with her back and leg pains  Plan: follow up 1- 2 weeks for home visit [facility] Cm gave Denise Freeman the local [place] IRS office information as [place]  [phone] or [place] [employer]  [address]   [place],  [address]   [phone] [facility] to route note to pcp Legrand Rams) and CV Lovena Le)  [place] Problem One     Most Recent Value  Care Plan Problem One  CHF home management deficit   Role Documenting the Problem One  Care Management [place] for Problem One   Active  [facility] Long Term Goal   over the next 45 days the patient will be able to verbalize home care management interventions for CHF  [facility] Long Term Goal Start Date  09/18/16  [facility] Long Term Goal Met Date  11/07/16  Interventions for Problem One Long Term Goal  Review CHF diagnosis, CHF zones, medications, diet and activity  [facility] CM Short Term Goal #1   over the next 7 days the patietn will receive her portable oxygen and tubing from [facility]  [facility] CM Short Term Goal #1 Start Date  09/18/16  [facility] CM Short Term Goal #1 Met Date  09/23/16  Interventions for Short Term Goal #1  contact [facility] to request service for DME  [facility] CM Short Term Goal #2   Over the next 7-14 days the patient will have decreased CHF symptoms by evidence of decreased lower extremity edema and decreased soc  [facility] CM Short Term Goal #2 Start Date  09/18/16  [facility] CM Short Term Goal #2 Met Date  09/23/16  Interventions for Short Term Goal #2  Assessment of CHF symptoms, call to CV office to report abnormal symptoms and request medcation treatment and hospital folllow up appointments, monitor and evaluation treatment plan   [facility] CM Short Term Goal #3  over the next 30 days the patient will be seen by [facility] pharmacy staff to review pharmacy discrepancies and offer resolutions  [facility] CM Short Term Goal #3 Start Date  09/23/16  [facility] CM Short Term Goal #3 Met Date  10/25/16  Interventions for Short [place] Goal #3  referral to [place], review of medications, offer new pill box, monitor and evaluation pharmacy interventions       Delesha Freeman L. Lavina Hamman, RN, BSN, [place] Care Management [phone]

## 2016-11-22 ENCOUNTER — Other Ambulatory Visit: Payer: Self-pay | Admitting: Pharmacist

## 2016-11-22 NOTE — Patient Outreach (Signed)
[place] [facility]) Care Management  11/22/2016  Kerie Badger [facility] 1950-12-30 [id number]   Received a call from [place], Joellyn Quails stating the patient presented a letter to her requesting more information for medication assistance.  Patient was called.  HIPAA identifiers were obtained.  After speaking with the patient, it became clear, the letter was actually about her husband. The letter was from [employer] requesting proof of income.  Patient stated she had copies of both her husband and her benefit letters from [place] for the year.  She was encouraged to make a copy of the letters and place them in the self addressed envelope sent with the letter.  Patient communicated understanding.   Plan:  Follow up with patient at our previously scheduled follow up 12/11/16   [name], PharmD, [place] Clinical Pharmacist [phone]

## 2016-11-25 ENCOUNTER — Ambulatory Visit: Payer: Medicare Other | Admitting: Internal Medicine
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
## 2016-11-26 ENCOUNTER — Ambulatory Visit (INDEPENDENT_AMBULATORY_CARE_PROVIDER_SITE_OTHER): Payer: Medicare Other | Admitting: Family Medicine

## 2016-11-26 ENCOUNTER — Encounter: Payer: Self-pay | Admitting: Family Medicine

## 2016-11-26 VITALS — BP 110/56 | HR 64 | Temp 98.3°F | Ht 59.0 in | Wt 187.6 lb

## 2016-11-26 DIAGNOSIS — R21 Rash and other nonspecific skin eruption: Secondary | ICD-10-CM | POA: Diagnosis not present

## 2016-11-26 MED ORDER — DOXYCYCLINE HYCLATE 100 MG PO TABS: 100.0000 mg | ORAL_TABLET | Freq: Two times a day (BID) | ORAL | 0 refills | Status: DC

## 2016-11-26 NOTE — Assessment & Plan Note (Signed)
Acute. Suspicious for SCC though history not consistent due to acuity. Patient does have several scars near lesion but cannot identify what these are from. Could be bacterial given weeping appearance. Patient agreeable to shave biopsy if not improved with antibiotics. --Doxycycline 100 mg twice daily for 10 days --Patient to follow-up in 2 weeks if not improved for possible shave biopsy

## 2016-11-26 NOTE — Patient Instructions (Signed)
Thank you for coming in to see Denise Freeman today. Please see below to review our plan for today's visit.  1. Orofacial wound is likely a bacterial infection. I have prescribed a medication called doxycycline. Take 100 mg tablets twice per day for 2 weeks. Stay out of the sun and keep protected from sunlight while on this medication. He should also avoid taking calcium or iron supplements or drinking milk while taking this medication. 2. If symptoms do not improve after 2 weeks, return to the clinic.  Please call the clinic at [phone] if your symptoms worsen or you have any concerns. It was my pleasure to see you. -- Harriet Butte, [place], PGY-1

## 2016-11-27 ENCOUNTER — Telehealth: Payer: Self-pay | Admitting: Internal Medicine

## 2016-11-27 DIAGNOSIS — R21 Rash and other nonspecific skin eruption: Secondary | ICD-10-CM

## 2016-11-27 MED ORDER — DOXYCYCLINE HYCLATE 100 MG PO TABS: 100.0000 mg | ORAL_TABLET | Freq: Two times a day (BID) | ORAL | 0 refills | Status: DC

## 2016-11-27 NOTE — Telephone Encounter (Signed)
Pt is calling because the Doxycycline was sent to the wrong pharmacy. Please send this to [facility] in [place]. jw

## 2016-11-29 ENCOUNTER — Ambulatory Visit: Payer: Medicare Other | Admitting: Internal Medicine

## 2016-12-02 ENCOUNTER — Other Ambulatory Visit: Payer: Self-pay | Admitting: *Deleted
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
## 2016-12-05 ENCOUNTER — Ambulatory Visit (INDEPENDENT_AMBULATORY_CARE_PROVIDER_SITE_OTHER): Payer: Medicare Other | Admitting: Internal Medicine

## 2016-12-05 ENCOUNTER — Encounter: Payer: Self-pay | Admitting: Internal Medicine

## 2016-12-05 VITALS — BP 98/58 | HR 70 | Temp 98.4°F | Wt 184.0 lb

## 2016-12-05 DIAGNOSIS — R21 Rash and other nonspecific skin eruption: Secondary | ICD-10-CM | POA: Diagnosis present

## 2016-12-05 NOTE — Progress Notes (Signed)
[name] Progress Note  Subjective:  Denise Freeman is a 66 y.o. female with OSA, CHF, T2DM, COPD on chronic supplemental O2, chronic back pain, tremor and GERD who presents to follow-up skin lesion.  #Skin lesion: - Was seen for this 6/26 and started on 10-day course of doxycyline for skin infection - Patient says she thinks area of irritation started from CPAP mask that was too small; she has since been using a larger device - Wound no longer draining - Patient concerned that she has had other skin lesions--one on her R upper back and 2 on her R upper arm from when she was hospitalized this spring, though says these are getting better. She notes that she uses all types of lotions (unscented) multiple times a day but still has severely dry skin. - Has been having a lot of diarrhea on the doxycyline but has continued to take medication. Has only 3 doses left.  - Asked patient about diabetes control, and she has concerns her sugars have been running too low. She does not feel comfortable using short acting insulin.  ROS: No fevers or chills, no new rashes  Objective: Blood pressure (!) 98/58, pulse 70, temperature 98.4 F (36.9 C), temperature source Oral, weight 184 lb (83.5 kg), SpO2 98 %. Body mass index is 37.16 kg/m. Constitutional: Obese female, pleasant, in NAD Cardiovascular: RRR, S1, S2, no m/r/g.  Pulmonary/Chest: Effort normal and breath sounds normal. No respiratory distress.  Neurological: AOx3, no focal deficits. Skin: Lesion of R cheek now without surrounding erythema and drying up. Other wounds (R upper back, R upper arm) < 1 cm and dried. Skin very dry.  Psychiatric: Somewhat anxious Vitals reviewed        Lesion photographed 6/26:    Assessment/Plan: Rash of face - Improved. Now without surrounding erythema and now systemic symptoms. - Advised patient she could stop doxycycline, as had already completed over 7 days of treatment and  having bothersome diarrhea. - Counseled patient to keep lesion covered with a bandaid to keep protected from further irritation from nasal canula. She already requested and is using padding on her CPAP nasal pillow.  - Suspect area will continue to heal. If still present in about 1 month, would then consider biopsy to rule-out SCC, but today's appearance much more reassuring.  - Recommended eucerin lotion and vaseline spray as options to keep skin better hydrated.   Diabetes likely contributing to slow wound healing at other sites. Last A1c 8.1. Suggested longer acting metformin given patient's concerns she drops too low with morning metformin dose; she plans to discuss with her Endocrinologist.   Follow-up already scheduled for 2 weeks. Offered patient option to cancel and follow-up in a couple months, but she prefers to keep appointment in case something else comes up or wound not healed.   [name], MD [place], PGY-3

## 2016-12-05 NOTE — Patient Instructions (Addendum)
Ms. Alatorre,  I think you can go ahead and stop the doxycycline antibiotic based on how good your spot looks now.  Keep the area covered to avoid irritation.  I recommend vaseline spray and/or eucerin lotion to help keep your skin from having breaks.  Best, Dr. Ola Spurr

## 2016-12-06 NOTE — Assessment & Plan Note (Addendum)
-   Improved. Now without surrounding erythema and no systemic symptoms. - Advised patient she could stop doxycycline, as had already completed over 7 days of treatment and having bothersome diarrhea. - Counseled patient to keep lesion covered with a bandaid to keep protected from further irritation from nasal canula. She already requested and is using padding on her CPAP nasal pillow.  - Suspect area will continue to heal. If still present in about 1 month, would then consider biopsy to rule-out SCC, but today's appearance much more reassuring.  - Recommended eucerin lotion and vaseline spray as options to keep skin better hydrated.

## 2016-12-09 ENCOUNTER — Other Ambulatory Visit: Payer: Self-pay

## 2016-12-09 ENCOUNTER — Ambulatory Visit (INDEPENDENT_AMBULATORY_CARE_PROVIDER_SITE_OTHER): Payer: Medicare Other | Admitting: Internal Medicine

## 2016-12-09 VITALS — HR 78 | Ht 59.0 in | Wt 184.0 lb

## 2016-12-09 DIAGNOSIS — R0602 Shortness of breath: Secondary | ICD-10-CM

## 2016-12-09 DIAGNOSIS — I4891 Unspecified atrial fibrillation: Secondary | ICD-10-CM | POA: Diagnosis not present

## 2016-12-09 DIAGNOSIS — I5032 Chronic diastolic (congestive) heart failure: Secondary | ICD-10-CM

## 2016-12-09 DIAGNOSIS — N183 Chronic kidney disease, stage 3 unspecified: Secondary | ICD-10-CM

## 2016-12-09 DIAGNOSIS — Z79899 Other long term (current) drug therapy: Secondary | ICD-10-CM | POA: Diagnosis not present

## 2016-12-09 DIAGNOSIS — I428 Other cardiomyopathies: Secondary | ICD-10-CM | POA: Diagnosis not present

## 2016-12-09 MED ORDER — LOSARTAN POTASSIUM 25 MG PO TABS: 25.0000 mg | ORAL_TABLET | Freq: Every day | ORAL | 3 refills | Status: DC

## 2016-12-09 MED ORDER — METFORMIN HCL ER 500 MG PO TB24: 500.0000 mg | ORAL_TABLET | Freq: Two times a day (BID) | ORAL | 2 refills | Status: DC

## 2016-12-09 NOTE — Patient Instructions (Addendum)
Your physician has recommended you make the following change in your medication:  -- DECREASE losartan to 25mg  once daily  Your physician recommends that you return for lab work TODAY   Your physician recommends that you schedule a follow-up appointment in: [place] with [name]

## 2016-12-09 NOTE — Progress Notes (Signed)
OFFICE NOTE  Chief Complaint:  Fatigue, breathing improved  Primary Care Physician: Rogue Bussing, MD  HPI:  Denise Freeman is a pleasant 66 year old female who is establishing cardiac care today. She is accompanied by her husband and they recently moved here from [place], [place]. She has a history of severe COPD on home oxygen. She also has a history of stress-induced cardiomyopathy in the past. She has since had recovery of her EF. She's had numerous cardiac catheterizations. None of which showed obstructive coronary disease. Her last cardiac catheterization was in October 2014 which demonstrated no significant coronary disease. This was after a small abnormality was noted in the apex suggestive of ischemia on a nuclear stress test. She does have a history of permanent atrial fibrillation on Coumadin. She will need to have her INRs followed here. Unfortunate she has not had her INR checked in over 9 weeks and it was assessed today and was low. We will need to adjust her medication. She will be established in our anticoagulation clinic. Blood pressure looks well controlled today. She is on cholesterol medication.  I saw Denise Freeman back today in the office. Unfortunate she was hospitalized in December for worsening shortness of breath and probable pneumonia with a mild diastolic heart failure exacerbation. She was given IV diuretics and her Lasix was increased to 40 mg 3 times a day. She's also taking metolazone 3 times weekly. She continues to complain of leg edema which is mostly dependent. She has significant shortness of breath and CO2 retention and an appointment with a pulmonologist is pending.  Denise Freeman returned today back in the office. Her main complaints are a number of excoriations and weeping lesions on her face and arms. She apparently is going to see a dermatologist about this. She's also had worsening leg swelling and shortness of breath. Her weight is now up  about 8 pounds since her last visit. She was instructed to take Lasix 40 mg 3 times a day but apparently she is taking it twice a day. She is taking the metolazone 3 times a week. She is wearing compression stockings which I had advised and seems that this helps her swelling.  Denise Freeman returns today and reports a big improvement in her leg swelling. She's currently on Lasix 60 mg twice daily and metolazone 3x weekly. Weight is come down and swelling is improved. Her BNP was over 250 and is come down to about 125.  I saw Denise Freeman back today in follow-up. She's been successful losing over 15 pounds which I congratulated her on. Her breathing continues to be fairly difficult. She is using BiPAP at night. She's been in the ER a few times for mostly difficulty breathing and COPD exacerbation. She tells me the other day she was in church and had an episode of sharp chest pain in her left anterior chest and left scapular area. She was quite upset and emotional today time. Recently she's been the target of possibly a scam, so that she is under a lot of stress. She was upset that she did not have anything to take for her chest discomfort. I tried to reassure her that her coronary arteries have look normal with multiple catheterizations. She does have some diastolic dysfunction I suspect her LVEDP goes up when she gets upset causing her chest discomfort.  10/31/2015  Denise Freeman was seen today in follow-up from her recent hospitalization. She was seen in January for acute GI bleeding thought  to be upper GI bleeding and required transfusion for hemoglobin of 5. She was evaluated by Dr. Silvano Rusk with [facility] who recommended an EGD. Unfortunately, it looks like she was discharged the next day, possibly [place] after she removed her IV and stated she needed to go home. She relates that she never saw the specialist and does not recall seeing Dr. Carlean Purl. Since that time she's continued  to have blood in her stool. She was taken off of Xarelto and not restarted on anticoagulation. She does have a high CHADSVASC score of 5, suggesting a higher risk of stroke.  08/07/2016  Denise Freeman returns for follow-up. She underwent GI work-up with no clear bleeding source. She does not want to be on anticoagulation for fear of another stroke. She attributes her vision loss and word-finding difficulty to her prior stroke on Xarelto. She was previously on warfarin in the distant past, but reported having difficulty maintaining a therapeutic INR. We discussed the possibility of a Watchman device today - she seems interested in this. I explained the procedure and will refer her to Dr. Rayann Heman for evaluation.  5/30/20181  Denise Freeman was seen today in follow-up. She recently saw Ignacia Bayley, NP, after hospital follow-up. Given her recurrent  cardiomyopathy with EF 30-35%, he recommended starting her on BiDil. This is based on recent lab work demonstrating significant renal insufficiency however repeat lab work shows normal creatinine. She recently has had 10 pound weight gain and feels that it is related to a decrease in her metolazone to a half tablet daily. Previously had had her on 80 mg Lasix twice a day and metolazone 2.5 mg daily.  12/09/2016  Denise Freeman returns today for follow-up. She reports some improvement in her fatigue and breathing. I placed her on both metolazone as well as a losartan. Today her blood pressures noted to be low at 90/50 and she is down about 6 pounds with a diuretic. Recently lab work indicated a small increase in her creatinine up to 1.35 with a baseline of about 1.0-1.1. She says a diuretic is doing good job of keeping her swelling off, however I wonder she may be a little bit over diuresis. In addition she may not be tolerating the ARB.  PMHx:  Past Medical History:  Diagnosis Date  . Allergy   . Anemia   . Anxiety   . Asthma   . Atrial fibrillation ([place])   . Bipolar 1  disorder ([place])   . 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 Cholelithiasis    a. 09/2016 s/p Lap Chole.  . Chronic bronchitis ([place])   . Chronic combined systolic and diastolic congestive heart failure ([place])    a. 09/2016 Echo: EF 30-35%.  . Colon polyps   . COPD (chronic obstructive pulmonary disease) ([place])   . Depression   . Family history of adverse reaction to anesthesia    Uncle was positive for malignant hyperthermia; patient had testing done and was negative.  . Fibromyalgia   . GERD (gastroesophageal reflux disease)   . Gout   . High cholesterol   . History of blood transfusion 06/2015   "bleeding from my rectum"  . History of hiatal hernia   . HOH (hard of hearing)   . Hx of colonic polyps 03/21/2016   3 small adenomas no recall - co-morbidities  . Neuropathy    Disc Back   . NICM (nonischemic cardiomyopathy) ([place])    a. Previously worked  up in [place], MD-->low EF with subsequent recovery.  Multiple caths (last ~ 2014 per pt report)--reportedly nl cors;  b. 09/2016 Echo: EF 30-35%, antsept/apical HK, mild MR, mildly dil LA, mod dil RA;  c. 09/2016 Lexi MV: EF 26%, glob HK, sept DK, med size, mod intensity fixed septal defect - BBB/PVC related artifact, no ischemia.  . On home oxygen therapy    "3L; 24/7" (03/14/2016)  . OSA treated with BiPAP    uses biPAP, 10 (03/14/2016)  . Osteoarthritis   . Oxygen deficiency   . Pneumonia   . Type II diabetes mellitus ([place])     Past Surgical History:  Procedure Laterality Date  . APPENDECTOMY     "they busted"  . bladder stimulator     pt states, "it cannot be turned off; it's in my right hip; dead battery so it's not working anymore". (03/14/2016)  . BLADDER SUSPENSION     2003, 2006 and 2010  . CATARACT EXTRACTION W/PHACO Right 11/29/2014   Procedure: CATARACT EXTRACTION PHACO AND INTRAOCULAR LENS PLACEMENT (IOC);  Surgeon: Rutherford Guys, MD;  Location: [facility];  Service: Ophthalmology;  Laterality: Right;  CDE:3.81    . CATARACT EXTRACTION W/PHACO Left 12/13/2014   Procedure: CATARACT EXTRACTION PHACO AND INTRAOCULAR LENS PLACEMENT (IOC);  Surgeon: Rutherford Guys, MD;  Location: [facility];  Service: Ophthalmology;  Laterality: Left;  CDE:6.59  . CERVICAL DISC SURGERY N/A 2009   4, 6, and 7 cervical disc replaced  . CHOLECYSTECTOMY N/A 09/13/2016   Procedure: LAPAROSCOPIC CHOLECYSTECTOMY;  Surgeon: Rolm Bookbinder, MD;  Location: [facility];  Service: General;  Laterality: N/A;  . COLONOSCOPY WITH PROPOFOL N/A 03/15/2016   Procedure: COLONOSCOPY WITH PROPOFOL;  Surgeon: Gatha Mayer, MD;  Location: [facility];  Service: Endoscopy;  Laterality: N/A;  . ESOPHAGOGASTRODUODENOSCOPY (EGD) WITH PROPOFOL N/A 03/15/2016   Procedure: ESOPHAGOGASTRODUODENOSCOPY (EGD) WITH PROPOFOL;  Surgeon: Gatha Mayer, MD;  Location: [facility];  Service: Endoscopy;  Laterality: N/A;  . HEEL SPUR SURGERY Bilateral   . HERNIA REPAIR    . I&D EXTREMITY Right 06/13/2015   Procedure: MINOR IRRIGATION AND DEBRIDEMENT EXTREMITY REMOVAL OF NAIL;  Surgeon: Daryll Brod, MD;  Location: [facility];  Service: Orthopedics;  Laterality: Right;  . TUBAL LIGATION    . UMBILICAL HERNIA REPAIR     w/mesh    FAMHx:  Family History  Problem Relation Age of Onset  . Heart disease Mother   . COPD Mother   . Diabetes Mother   . Breast cancer Mother   . Heart disease Father   . Hyperlipidemia Father   . COPD Sister   . Heart disease Sister   . Diabetes Sister   . Heart disease Maternal Grandmother     SOCHx:   reports that she quit smoking about 27 years ago. Her smoking use included Cigarettes. She has a 28.00 pack-year smoking history. She has never used smokeless tobacco. She reports that she does not drink alcohol or use drugs.  ALLERGIES:  Allergies  Allergen Reactions  . Xarelto [Rivaroxaban] Other (See Comments)    Internal bleeding  . Ancef [Cefazolin] Nausea And Vomiting  . Levaquin [Levofloxacin In D5w] Other  (See Comments)    "afib"  . Lyrica [Pregabalin] Hives  . Tamiflu [Oseltamivir Phosphate] Other (See Comments)    "water blisters"  . Zoloft [Sertraline Hcl] Other (See Comments)    Jaw problems, jittery  . Augmentin [Amoxicillin-Pot Clavulanate] Itching  . Ciprofloxacin Itching and Nausea And Vomiting  .  Haldol [Haloperidol] Other (See Comments)    Restless leg  . Nsaids Diarrhea  . Penicillins Itching, Nausea And Vomiting and Rash    Has patient had a PCN reaction causing immediate rash, facial/tongue/throat swelling, SOB or lightheadedness with hypotension: Yes Has patient had a PCN reaction causing severe rash involving mucus membranes or skin necrosis: No Has patient had a PCN reaction that required hospitalization No Has patient had a PCN reaction occurring within the last 10 years: Yes If all of the above answers are "NO", then may proceed with Cephalosporin use.  . Topamax [Topiramate] Nausea Only    ROS: Pertinent items noted in HPI and remainder of comprehensive ROS otherwise negative.  HOME MEDS: Current Outpatient Prescriptions  Medication Sig Dispense Refill  . albuterol (PROVENTIL) (2.5 MG/3ML) 0.083% nebulizer solution Take 2.5 mg by nebulization every 6 (six) hours as needed for wheezing or shortness of breath.    Marland Kitchen atorvastatin (LIPITOR) 40 MG tablet Take 1 tablet (40 mg total) by mouth daily. 90 tablet 3  . beta carotene 25000 UNIT capsule Take 25,000 Units by mouth daily.    . cetirizine (ZYRTEC) 10 MG tablet Take 10 mg by mouth at bedtime.    . cholecalciferol (VITAMIN D) 400 units TABS tablet Take 1 tablet (400 Units total) by mouth 2 (two) times daily. 180 each 3  . docusate sodium (COLACE) 100 MG capsule Take 100 mg by mouth daily.    Marland Kitchen doxycycline (VIBRA-TABS) 100 MG tablet Take 1 tablet (100 mg total) by mouth 2 (two) times daily. 20 tablet 0  . escitalopram (LEXAPRO) 10 MG tablet Take 10 mg by mouth at bedtime.     . fenofibrate 54 MG tablet Take 1 tablet  (54 mg total) by mouth daily. 90 tablet 3  . ferrous sulfate 324 (65 Fe) MG TBEC Take 1 tablet by mouth daily.    . fluticasone (FLONASE) 50 MCG/ACT nasal spray Place 1 spray into both nostrils daily as needed for allergies or rhinitis. 32 g 6  . furosemide (LASIX) 40 MG tablet Take 2 tablets (80 mg total) by mouth 2 (two) times daily. 360 tablet 3  . Insulin Glargine (BASAGLAR KWIKPEN) 100 UNIT/ML SOPN Inject 25 units into the skin daily (Patient taking differently: Inject 40 Units into the skin at bedtime. Inject 25 units into the skin daily) 25 pen 0  . insulin lispro (HUMALOG KWIKPEN) 100 UNIT/ML KiwkPen CBG 70-120: 0 units; 121-150: 1 U; 151-200: 2 U; 201-250: 3 U; 251-300: 5 U; 301-350: 7 U; 351-400: 9 U 35 pen 3  . ipratropium (ATROVENT) 0.02 % nebulizer solution Take 0.5 mg by nebulization every 6 (six) hours as needed for wheezing or shortness of breath.    . losartan (COZAAR) 25 MG tablet Take 1 tablet (25 mg total) by mouth daily. 90 tablet 3  . metFORMIN (GLUCOPHAGE-XR) 500 MG 24 hr tablet Take 1 tablet (500 mg total) by mouth 2 (two) times daily. 180 tablet 2  . metolazone (ZAROXOLYN) 2.5 MG tablet Take 1 tablet (2.5 mg total) by mouth daily. 90 tablet 3  . metoprolol succinate (TOPROL-XL) 25 MG 24 hr tablet Take 1 tablet (25 mg total) by mouth 2 (two) times daily. 180 tablet 3  . mirtazapine (REMERON) 15 MG tablet Take 15 mg by mouth at bedtime.    . modafinil (PROVIGIL) 200 MG tablet Take 1 tablet (200 mg total) by mouth daily. 30 tablet 5  . montelukast (SINGULAIR) 10 MG tablet Take 1 tablet (10 mg  total) by mouth at bedtime. 30 tablet 6  . nitroGLYCERIN (NITROSTAT) 0.4 MG SL tablet Place 1 tablet (0.4 mg total) under the tongue every 5 (five) minutes as needed for chest pain. 25 tablet 3  . nystatin (NYSTATIN) powder Apply topically 2 (two) times daily. Apply under breasts 15 g 3  . ondansetron (ZOFRAN-ODT) 4 MG disintegrating tablet Take 1 tablet (4 mg total) by mouth 2 (two) times  daily as needed for nausea or vomiting. 30 tablet 0  . oxyCODONE-acetaminophen (PERCOCET/ROXICET) 5-325 MG tablet Take 1 tablet by mouth every 8 (eight) hours as needed for severe pain.    . OXYGEN Inhale 3 L into the lungs continuous. 3 liters 24/7    . pantoprazole (PROTONIX) 20 MG tablet Take 1 tablet (20 mg total) by mouth 2 (two) times daily before a meal. 180 tablet 1  . PRESCRIPTION MEDICATION Inhale into the lungs See admin instructions. Use BIPAP every time laying down    . primidone (MYSOLINE) 50 MG tablet Take 1 tablet (50 mg total) by mouth at bedtime. 90 tablet 3  . Probiotic Product (PROBIOTIC PO) Take 1 tablet by mouth at bedtime.    Marland Kitchen QUEtiapine (SEROQUEL) 25 MG tablet Take 25 mg by mouth at bedtime.  0  . triamcinolone lotion (KENALOG) 0.1 % Apply 1 application topically 3 (three) times daily. For up to 14 days and as needed 60 mL 1  . warfarin (COUMADIN) 4 MG tablet Take 1 tablet daily except 1/2 tablet on Tuesdays 30 tablet 3   No current facility-administered medications for this visit.     LABS/IMAGING: No results found for this or any previous visit (from the past 48 hour(s)). No results found.  VITALS: Pulse 78   Ht 4\' 11"  (1.499 m)   Wt 184 lb (83.5 kg)   BMI 37.16 kg/m   EXAM: General appearance: alert, appears older than stated age and no distress Neck: no carotid bruit and no JVD Lungs: diminished breath sounds bilaterally Heart: irregularly irregular rhythm Abdomen: soft, non-tender; bowel sounds normal; no masses,  no organomegaly Extremities: extremities normal, atraumatic, no cyanosis or edema Pulses: 2+ and symmetric Skin: Skin color, texture, turgor normal. No rashes or lesions Neurologic: Grossly normal Psych: pleasant  EKG: Atrial fibrillation with PVCs at 78  ASSESSMENT: 1. Permanent atrial fibrillation - not on anticoagulation due to recent GI bleeding and vision loss attributed to stroke 2. Acute on Chronic combined congestive heart  failure - EF 30-35% 3. History of stress-induced cardiomyopathy 4. No significant obstructive coronary disease after multiple catheterizations in 1996, 99, 2007 and 2014. 5. Fibromyalgia 6. Bipolar 1 disorder 7. Dyslipidemia 8. Hypertension 9. Severe COPD on home oxygen 10. Obstructive sleep apnea on CPAP 11. Morbid obesity - with weight loss recently 12. DNR  PLAN: 1.   Mrs. Mcerlean has had a 6 pound weight loss with diuretics although is hypotensive today and is been fatigued recently. I would recommend decreasing her losartan back to 25 mg daily. We'll also repeat a metabolic profile and BNP today. If it appears that she is somewhat over diuresed her creatinine is still elevated then I would likely decrease her metolazone to 3 times weekly rather than daily.  Denise Casino, MD, [facility] Attending Cardiologist [place] 12/09/2016, 1:13 PM

## 2016-12-10 LAB — PRO B NATRIURETIC PEPTIDE: NT-Pro BNP: 3805 pg/mL — ABNORMAL HIGH (ref 0–301)

## 2016-12-10 LAB — BASIC METABOLIC PANEL
BUN / CREAT RATIO: 54 — AB (ref 12–28)
BUN: 67 mg/dL — AB (ref 8–27)
CO2: 32 mmol/L — ABNORMAL HIGH (ref 20–29)
CREATININE: 1.24 mg/dL — AB (ref 0.57–1.00)
Calcium: 9.3 mg/dL (ref 8.7–10.3)
Chloride: 88 mmol/L — ABNORMAL LOW (ref 96–106)
GFR, EST AFRICAN AMERICAN: 53 mL/min/{1.73_m2} — AB (ref >59)
GFR, EST NON AFRICAN AMERICAN: 46 mL/min/{1.73_m2} — AB (ref >59)
Glucose: 189 mg/dL — ABNORMAL HIGH (ref 65–99)
Potassium: 3.5 mmol/L (ref 3.5–5.2)
Sodium: 139 mmol/L (ref 134–144)

## 2016-12-11 ENCOUNTER — Other Ambulatory Visit: Payer: Self-pay | Admitting: Pharmacist

## 2016-12-11 NOTE — Patient Outreach (Signed)
[facility]) Care Management  12/11/2016  Denise Freeman [facility] [birth year] [id number]   Patient was called to follow up on pill packing. She did not answer the phone. HIPAA compliant message was left on her voicemail. The [employer] was called to inquire about patient's pill packing. Pharmacist said they have been delivering the pill packs to the patient every month without any issues and were planning to deliver packs to her today.  Plan:  Follow up with the patient in 14 days due to my upcoming vacation and consider closing the case at that time.  [name], PharmD, [place] Clinical Pharmacist [phone]

## 2016-12-12 ENCOUNTER — Other Ambulatory Visit: Payer: Self-pay | Admitting: Pharmacist

## 2016-12-12 ENCOUNTER — Other Ambulatory Visit: Payer: Self-pay | Admitting: *Deleted

## 2016-12-12 NOTE — Patient Outreach (Signed)
[place] [facility]) Care Management  12/12/2016  Maison Agrusa [facility] 1950-10-11 [id number]   CSW called & spoke with patient regarding transportation issues. Patient states that she still has not scheduled an appointment for her husband to get surgery because she wants to make sure that she will have transportation to get there. CSW provided patient with the phone number for [facility] ([phone]) and encouraged her to call there as it would be the least costly option. Patient explained that she had issues with them in the past and CSW provided patient with contact at [place] to call if she had any issues. CSW informed patient that will check back in 2 weeks to ensure that appointments have been made & transportation has been arranged.    Raynaldo Opitz, LCSW [facility]  Clinical Social Worker cell #: [phone]

## 2016-12-12 NOTE — Patient Outreach (Signed)
[place] [facility]) Care Management  12/12/2016  Charleene Callegari [facility] February 18, 1951 [id number]   Patient called back from my call to her yesterday. HIPAA identifiers were obtained.  Patient reported she received her pill pack as promised and was/is not having any issues with her medications at this time.  Patient did report falling out of her bed last night while sleeping.  Patient said she did not hit her head but she did hit her arm where she already had a scar and had quite a bit of bleeding.  She also reported her left hip being in 8/10 pain.  Patient was instructed to call her physician and let them know she fell.  Patient is on warfarin and was urged to let her warfarin provider know about her fall as well.  Plan: Follow up with patient in 14 days Consider closing case after next call.  [name], PharmD, [place] Clinical Pharmacist [phone]

## 2016-12-13 ENCOUNTER — Ambulatory Visit: Payer: Medicare Other | Admitting: Internal Medicine

## 2016-12-16 ENCOUNTER — Ambulatory Visit (INDEPENDENT_AMBULATORY_CARE_PROVIDER_SITE_OTHER): Payer: Medicare Other | Admitting: *Deleted

## 2016-12-16 DIAGNOSIS — I480 Paroxysmal atrial fibrillation: Secondary | ICD-10-CM

## 2016-12-16 LAB — POCT INR: INR: 1.7

## 2016-12-18 DIAGNOSIS — M5136 Other intervertebral disc degeneration, lumbar region: Secondary | ICD-10-CM | POA: Diagnosis not present

## 2016-12-18 DIAGNOSIS — G894 Chronic pain syndrome: Secondary | ICD-10-CM | POA: Diagnosis not present

## 2016-12-18 DIAGNOSIS — Z79891 Long term (current) use of opiate analgesic: Secondary | ICD-10-CM | POA: Diagnosis not present

## 2016-12-18 DIAGNOSIS — M5032 Other cervical disc degeneration, mid-cervical region, unspecified level: Secondary | ICD-10-CM | POA: Diagnosis not present

## 2016-12-18 DIAGNOSIS — Z79899 Other long term (current) drug therapy: Secondary | ICD-10-CM | POA: Diagnosis not present

## 2016-12-18 DIAGNOSIS — M797 Fibromyalgia: Secondary | ICD-10-CM | POA: Diagnosis not present

## 2016-12-23 ENCOUNTER — Other Ambulatory Visit: Payer: Self-pay | Admitting: *Deleted

## 2016-12-23 NOTE — Patient Outreach (Signed)
[facility]) Care Management   12/23/2016  [facility] [birth year] [id number]  Denise Freeman is an 66 y.o. female  Subjective:   Objective:   BP 100/60   Pulse 69   Temp 97.9 F (36.6 C) (Oral)   Resp 20   SpO2 99%   Review of Systems  Constitutional: Negative for chills, diaphoresis, fever, malaise/fatigue and weight loss.  HENT: Negative for congestion, ear discharge, ear pain, hearing loss, nosebleeds, sinus pain, sore throat and tinnitus.   Eyes: Negative for blurred vision, double vision, photophobia, pain, discharge and redness.  Respiratory: Negative for cough, hemoptysis, sputum production, shortness of breath, wheezing and stridor.   Cardiovascular: Negative for chest pain, palpitations, orthopnea, claudication, leg swelling and PND.  Gastrointestinal: Negative for abdominal pain, blood in stool, constipation, diarrhea, melena, nausea and vomiting.  Genitourinary: Negative for dysuria, flank pain, frequency, hematuria and urgency.  Musculoskeletal: Positive for joint pain. Negative for back pain, falls, myalgias and neck pain.  Skin: Negative for itching and rash.  Neurological: Positive for dizziness and sensory change. Negative for tingling, tremors, speech change, focal weakness, seizures, loss of consciousness, weakness and headaches.       Burning bottom of feet   Endo/Heme/Allergies: Negative for environmental allergies and polydipsia. Does not bruise/bleed easily.  Psychiatric/Behavioral: Negative for depression, hallucinations, memory loss, substance abuse and suicidal ideas. The patient is nervous/anxious and has insomnia.     Physical Exam  Constitutional: She is oriented to person, place, and time. She appears well-developed and well-nourished.  HENT:  Head: Normocephalic and atraumatic.  Eyes: Pupils are equal, round, and reactive to light. Conjunctivae are normal.  Neck: Normal range of motion. Neck supple.   Cardiovascular: Normal rate and regular rhythm.   Respiratory: Effort normal and breath sounds normal.  GI: Soft. Bowel sounds are normal.  Neurological: She is alert and oriented to person, place, and time.  Skin: Skin is warm and dry.    Encounter Medications:   Outpatient Encounter Prescriptions as of 12/23/2016  Medication Sig Note  . albuterol (PROVENTIL) (2.5 MG/3ML) 0.083% nebulizer solution Take 2.5 mg by nebulization every 6 (six) hours as needed for wheezing or shortness of breath.   Marland Kitchen atorvastatin (LIPITOR) 40 MG tablet Take 1 tablet (40 mg total) by mouth daily.   . beta carotene 25000 UNIT capsule Take 25,000 Units by mouth daily.   . cetirizine (ZYRTEC) 10 MG tablet Take 10 mg by mouth at bedtime.   . cholecalciferol (VITAMIN D) 400 units TABS tablet Take 1 tablet (400 Units total) by mouth 2 (two) times daily.   Marland Kitchen docusate sodium (COLACE) 100 MG capsule Take 100 mg by mouth daily.   Marland Kitchen doxycycline (VIBRA-TABS) 100 MG tablet Take 1 tablet (100 mg total) by mouth 2 (two) times daily.   Marland Kitchen escitalopram (LEXAPRO) 10 MG tablet Take 10 mg by mouth at bedtime.    . fenofibrate 54 MG tablet Take 1 tablet (54 mg total) by mouth daily.   . ferrous sulfate 324 (65 Fe) MG TBEC Take 1 tablet by mouth daily.   . fluticasone (FLONASE) 50 MCG/ACT nasal spray Place 1 spray into both nostrils daily as needed for allergies or rhinitis.   . furosemide (LASIX) 40 MG tablet Take 2 tablets (80 mg total) by mouth 2 (two) times daily.   . Insulin Glargine (BASAGLAR KWIKPEN) 100 UNIT/ML SOPN Inject 25 units into the skin daily (Patient taking differently: Inject 40 Units into the skin at bedtime. Inject  25 units into the skin daily)   . insulin lispro (HUMALOG KWIKPEN) 100 UNIT/ML KiwkPen CBG 70-120: 0 units; 121-150: 1 U; 151-200: 2 U; 201-250: 3 U; 251-300: 5 U; 301-350: 7 U; 351-400: 9 U 09/23/2016: Patient said she is injecting 24 units in the AM, 28 units at lunch and 28 units at bedtime (uses sliding  scale when blood sugar is very high)  . ipratropium (ATROVENT) 0.02 % nebulizer solution Take 0.5 mg by nebulization every 6 (six) hours as needed for wheezing or shortness of breath.   . losartan (COZAAR) 25 MG tablet Take 1 tablet (25 mg total) by mouth daily.   . metFORMIN (GLUCOPHAGE-XR) 500 MG 24 hr tablet Take 1 tablet (500 mg total) by mouth 2 (two) times daily.   . metolazone (ZAROXOLYN) 2.5 MG tablet Take 1 tablet (2.5 mg total) by mouth daily.   . metoprolol succinate (TOPROL-XL) 25 MG 24 hr tablet Take 1 tablet (25 mg total) by mouth 2 (two) times daily.   . mirtazapine (REMERON) 15 MG tablet Take 15 mg by mouth at bedtime.   . modafinil (PROVIGIL) 200 MG tablet Take 1 tablet (200 mg total) by mouth daily.   . montelukast (SINGULAIR) 10 MG tablet Take 1 tablet (10 mg total) by mouth at bedtime.   . nitroGLYCERIN (NITROSTAT) 0.4 MG SL tablet Place 1 tablet (0.4 mg total) under the tongue every 5 (five) minutes as needed for chest pain.   Marland Kitchen nystatin (NYSTATIN) powder Apply topically 2 (two) times daily. Apply under breasts   . ondansetron (ZOFRAN-ODT) 4 MG disintegrating tablet Take 1 tablet (4 mg total) by mouth 2 (two) times daily as needed for nausea or vomiting.   Marland Kitchen oxyCODONE-acetaminophen (PERCOCET/ROXICET) 5-325 MG tablet Take 1 tablet by mouth every 8 (eight) hours as needed for severe pain.   . OXYGEN Inhale 3 L into the lungs continuous. 3 liters 24/7   . pantoprazole (PROTONIX) 20 MG tablet Take 1 tablet (20 mg total) by mouth 2 (two) times daily before a meal.   . PRESCRIPTION MEDICATION Inhale into the lungs See admin instructions. Use BIPAP every time laying down   . primidone (MYSOLINE) 50 MG tablet Take 1 tablet (50 mg total) by mouth at bedtime.   . Probiotic Product (PROBIOTIC PO) Take 1 tablet by mouth at bedtime.   Marland Kitchen QUEtiapine (SEROQUEL) 25 MG tablet Take 25 mg by mouth at bedtime.   . triamcinolone lotion (KENALOG) 0.1 % Apply 1 application topically 3 (three) times  daily. For up to 14 days and as needed   . warfarin (COUMADIN) 4 MG tablet Take 1 tablet daily except 1/2 tablet on Tuesdays    No facility-administered encounter medications on file as of 12/23/2016.     Functional Status:   In your present state of health, do you have any difficulty performing the following activities: 10/16/2016 09/23/2016  Hearing? [name]  Vision? Y Y  Difficulty concentrating or making decisions? Y N  Walking or climbing stairs? Y Y  Dressing or bathing? N N  Doing errands, shopping? N Y  Some recent data might be hidden    Fall/Depression Screening:    Fall Risk  12/12/2016 11/26/2016 11/04/2016  Falls in the past year? Yes No No  Number falls in past yr: - - -  Injury with Fall? Yes - -  Risk Factor Category  - - High Fall Risk  Risk for fall due to : Other (Comment);Impaired vision - -  Risk for  fall due to (comments): patient fell out of her bed - -  Follow up - - -   PHQ 2/9 Scores 12/12/2016 12/05/2016 11/26/2016 11/04/2016 10/16/2016 09/06/2016 07/18/2016  PHQ - 2 Score 2 0 0 0 2 0 0  PHQ- 9 Score 5 - - - 9 - -    Assessment:   Today Denise Freeman concern with her rental agency issues and receiving DME from her DME company [employer] : [address], [place], [place] [address]  [phone] called by [facility] CM in patient. Recommendation to call office all times to have them to put the order in to the office just in case Johnathan [phone] now correct number  Discussed the tanks needed 10 D tanks 2 -25 foot tubing 2 -1 foot canulos  Right forearm and hand wounds healed from fall Continues with frustration but is awaiting addition of medications to her blister packs for seroquel, lexapro and remeron She is talking fast today  Not using scooter today  100/60 c/o some dizziness and stress related neighbor, telephone, sleeping pills discuss holding htn med and recheck bp in 30 minutes on 01/13/17 see Dr Rosine Door for bipolar  [facility] CM sent a  email to [facility] pharmacist  Still states her endocrinologist will not assist with DM shoes    Plan:  To see Denise Freeman in 4 weeks as agreed  [facility] CM Care Plan Problem One     Most Recent Value  Care Plan Problem One  CHF home management deficit   Role Documenting the Problem One  Care Management Coordinator  Care Plan for Problem One  Active  [facility] Long Term Goal   over the next 45 days the patient will be able to verbalize home care management interventions for CHF  [facility] Long Term Goal Start Date  09/18/16  [facility] Long Term Goal Met Date  11/07/16  Interventions for Problem One Long Term Goal  Review CHF diagnosis, CHF zones, medications, diet and activity  [facility] CM Short Term Goal #1   over the next 7 days the patietn will receive her portable oxygen and tubing from [facility]  [facility] CM Short Term Goal #1 Start Date  09/18/16  [facility] CM Short Term Goal #1 Met Date  09/23/16  Interventions for Short Term Goal #1  contact [facility] to request service for DME  [facility] CM Short Term Goal #2   Over the next 7-14 days the patient will have decreased CHF symptoms by evidence of decreased lower extremity edema and decreased soc  [facility] CM Short Term Goal #2 Start Date  09/18/16  [facility] CM Short Term Goal #2 Met Date  09/23/16  Interventions for Short Term Goal #2  Assessment of CHF symptoms, call to CV office to report abnormal symptoms and request medcation treatment and hospital folllow up appointments, monitor and evaluation treatment plan   [facility] CM Short Term Goal #3  over the next 30 days the patient will be seen by [facility] pharmacy staff to review pharmacy discrepancies and offer resolutions  [facility] CM Short Term Goal #3 Start Date  09/23/16  [facility] CM Short Term Goal #3 Met Date  10/25/16  Interventions for Short Tern Goal #3  referral to [place], review of medications, offer new pill box, monitor and evaluation pharmacy interventions      L. Lavina Hamman, RN, BSN, [place] Care Management [phone]

## 2016-12-25 ENCOUNTER — Other Ambulatory Visit: Payer: Self-pay | Admitting: Pharmacist

## 2016-12-25 NOTE — Patient Outreach (Addendum)
[facility]) Care Management  12/25/2016  Denise Freeman [facility] December 12, 1950 [id number]   Called patient to follow up with her on pill packs. No answer.  HIPAA compliant message left on her voicemail.   Plan: Await a call back from the patient  Call patient back in 3-5 business days if I do not hear from her.  [name], PharmD, [place] Clinical Pharmacist [phone]  ADDENDUM Patient called me back. HIPAA identifiers were obtained. Patient reported everything is going well with her pill packs. She inquired about medication assistance with insulin. It looks like she no qualifies to get insulin from [place] as she reported her most recent EOB stated her out-of-pocket spend was >$1200.  Plan: Home visit scheduled to complete applications with the patient on 01/03/17.    Refer patient to [place], Doreene Burke once forms have been signed and financial information requested and received.  [name], PharmD, [place] Clinical Pharmacist [phone]

## 2016-12-26 ENCOUNTER — Telehealth: Payer: Self-pay | Admitting: Internal Medicine

## 2016-12-26 ENCOUNTER — Other Ambulatory Visit: Payer: Self-pay | Admitting: *Deleted

## 2016-12-26 NOTE — Telephone Encounter (Signed)
Returned call to patient with pharmacist advice and she voiced understanding and agreed w/plan. Med list updated. Patient scheduled for HTN clinic appt on 01/01/17

## 2016-12-26 NOTE — Telephone Encounter (Signed)
Pt c/o BP issue: STAT if pt c/o blurred vision, one-sided weakness or slurred speech  1. What are your last 5 BP readings? 12-20-16 102/70 rate 71// 12-22-79 166/131   2. Are you having any other symptoms (ex. Dizziness, headache, blurred vision, passed out)?   3. What is your BP issue? BP Low   Patient calling, states that [name] cut medication in half, but patient would like to know if she should stop losartan medication.

## 2016-12-26 NOTE — Telephone Encounter (Signed)
Patient with history of afib and pulse > 60.  Recommendation: 1. Resume metoprolol as prescribed 2. Hold losartan dose for now 3. Stay hydrated 3. Schedule HTN clinic visit as soon as possible; patient to bring BP home device and BP log to appointment

## 2016-12-26 NOTE — Telephone Encounter (Signed)
Returned call to patient who reports low BP Patient checks her BP with a wrist BP cuff She was advised on 7/9 by [name] to decrease losartan to 25mg  daily She states her home health nurse visits and her BP is too low - checked with arm cuff  -- 114/78 with HR 90  -- this nurse advised that she stop metoprolol   She HELD metoprolol and losartan today per this nurse's recommendations  Patient called her pharmacy and they told her to call our office Patient states she does not have energy  7/20 - 102/70    HR 71 7/21 - 166/131  HR 103 7/22 - 159/79    HR 75 7/23 - 149/116  HR 73 7/24 - 74/45      HR 81 7/25 - 124/69    HR 82 7/26 - 85/54      HR 103           93/58      HR 78  Informed patient I would need to seek recommendations from our HTN clinic regarding her medications

## 2016-12-26 NOTE — Patient Outreach (Signed)
[facility]) Care Management  12/26/2016  Denise Freeman [facility] 14-Jun-1950 [id number]   CSW attempted to reach patient to follow-up on transportation issues, but no answer. CSW left HIPPA compliant voicemail on her home #: [phone]. CSW will await return call or will try again within a week.    Raynaldo Opitz, LCSW [facility]  Clinical Social Worker cell #: [phone]

## 2016-12-30 ENCOUNTER — Ambulatory Visit (INDEPENDENT_AMBULATORY_CARE_PROVIDER_SITE_OTHER): Payer: Medicare Other | Admitting: *Deleted

## 2016-12-30 DIAGNOSIS — I480 Paroxysmal atrial fibrillation: Secondary | ICD-10-CM | POA: Diagnosis not present

## 2016-12-30 LAB — POCT INR: INR: 2

## 2016-12-30 NOTE — Patient Outreach (Addendum)
[facility]) Care Management  12/30/2016  Denise Freeman [facility] Sep 05, 1950 [id number]   CSW received call back from patient stating that she wasn't feeling well the day I tried to call her due to her diabetes and blood sugar. Patient reports she feels much better today. CSW reviewed resources provided to patient on transportation & patient agreed with plan for CSW to close out patient as all goals of treatment have been met from social work standpoint and no additional social work needs have been identified at this time. CSW will notify patient's RNCM with [facility], [place] Pharmacist, Alwyn Ren of CSW's plans to close patient's case.   CSW will fax an update to patient's Primary Care Physician, Dr. [name] to ensure that they are aware of CSW's involvement with patient's plan of care.   CSW ensured that patient is aware of RNCM & Pharmacist with [facility] continued involvement with patient's care.    Raynaldo Opitz, LCSW [facility]  Clinical Social Worker cell #: [phone]

## 2016-12-31 ENCOUNTER — Ambulatory Visit: Payer: Medicare Other | Admitting: Pharmacist

## 2016-12-31 ENCOUNTER — Other Ambulatory Visit: Payer: Self-pay | Admitting: *Deleted

## 2016-12-31 ENCOUNTER — Other Ambulatory Visit: Payer: Self-pay | Admitting: Internal Medicine

## 2017-01-01 ENCOUNTER — Ambulatory Visit: Payer: Medicare Other

## 2017-01-01 NOTE — Progress Notes (Deleted)
[facility]: [facility]                 DOB: 1950/09/13                      MRN: [id number]     HPI: Denise Freeman is a 66 y.o. female referred by [name] to HTN clinic. PMH includes atrial fibrillation, bipolar disorder, HF with EF 30-35%, high cholesterol, non-ischemic cardiomyopathy, and DM-II.     Current HTN meds:  Furosemide 80mg  twice daily Losartan 25mg  daily (On HOLD on 12/26/16) Metolazone 2.5mg  daily Metoprolol succinate 25mg  twice daily  Previously tried:   BP goal: 130/80  Family History:Reports  heart disease, diabetes, COPD, and breat cancer from mother. Heart disease and hyperlipidemia from father. Heart disease, COPD and diabetes from sister.  Social History: Former smoker; denies smokeless tobacco. She reports that she does not drink alcohol or use drugs.  Diet:   Exercise:   Home BP readings:   Wt Readings from Last 3 Encounters:  12/09/16 184 lb (83.5 kg)  12/05/16 184 lb (83.5 kg)  12/02/16 180 lb (81.6 kg)   BP Readings from Last 3 Encounters:  12/23/16 100/60  12/05/16 (!) 98/58  12/02/16 128/74   Pulse Readings from Last 3 Encounters:  12/23/16 69  12/09/16 78  12/05/16 70    Renal function: CrCl cannot be calculated ([name] most recent lab result is older than the maximum 21 days allowed.).  Past Medical History:  Diagnosis Date  . Allergy   . Anemia   . Anxiety   . Asthma   . Atrial fibrillation ([place])   . Bipolar 1 disorder ([place])   . 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 Cholelithiasis    a. 09/2016 s/p Lap Chole.  . Chronic bronchitis ([place])   . Chronic combined systolic and diastolic congestive heart failure ([place])    a. 09/2016 Echo: EF 30-35%.  . Colon polyps   . COPD (chronic obstructive pulmonary disease) ([place])   . Depression   . Family history of adverse reaction to anesthesia    Uncle was positive for malignant hyperthermia; patient had testing done and was negative.  . Fibromyalgia   .  GERD (gastroesophageal reflux disease)   . Gout   . High cholesterol   . History of blood transfusion 06/2015   "bleeding from my rectum"  . History of hiatal hernia   . HOH (hard of hearing)   . Hx of colonic polyps 03/21/2016   3 small adenomas no recall - co-morbidities  . Neuropathy    Disc Back   . NICM (nonischemic cardiomyopathy) ([place])    a. Previously worked up in [place], MD-->low EF with subsequent recovery.  Multiple caths (last ~ 2014 per pt report)--reportedly nl cors;  b. 09/2016 Echo: EF 30-35%, antsept/apical HK, mild MR, mildly dil LA, mod dil RA;  c. 09/2016 Lexi MV: EF 26%, glob HK, sept DK, med size, mod intensity fixed septal defect - BBB/PVC related artifact, no ischemia.  . On home oxygen therapy    "3L; 24/7" (03/14/2016)  . OSA treated with BiPAP    uses biPAP, 10 (03/14/2016)  . Osteoarthritis   . Oxygen deficiency   . Pneumonia   . Type II diabetes mellitus ([place])     Current Outpatient Prescriptions on File Prior to Visit  Medication Sig Dispense Refill  . albuterol (PROVENTIL) (2.5 MG/3ML) 0.083% nebulizer solution Take 2.5 mg by  nebulization every 6 (six) hours as needed for wheezing or shortness of breath.    Marland Kitchen atorvastatin (LIPITOR) 40 MG tablet Take 1 tablet (40 mg total) by mouth daily. 90 tablet 3  . beta carotene 25000 UNIT capsule Take 25,000 Units by mouth daily.    . cetirizine (ZYRTEC) 10 MG tablet Take 10 mg by mouth at bedtime.    . cholecalciferol (VITAMIN D) 400 units TABS tablet Take 1 tablet (400 Units total) by mouth 2 (two) times daily. 180 each 3  . docusate sodium (COLACE) 100 MG capsule Take 100 mg by mouth daily.    Marland Kitchen doxycycline (VIBRA-TABS) 100 MG tablet Take 1 tablet (100 mg total) by mouth 2 (two) times daily. 20 tablet 0  . escitalopram (LEXAPRO) 10 MG tablet Take 10 mg by mouth at bedtime.     . fenofibrate 54 MG tablet Take 1 tablet (54 mg total) by mouth daily. 90 tablet 3  . ferrous sulfate 324 (65 Fe) MG TBEC Take 1 tablet  by mouth daily.    . fluticasone (FLONASE) 50 MCG/ACT nasal spray Place 1 spray into both nostrils daily as needed for allergies or rhinitis. 32 g 6  . furosemide (LASIX) 40 MG tablet Take 2 tablets (80 mg total) by mouth 2 (two) times daily. 360 tablet 3  . Insulin Glargine (BASAGLAR KWIKPEN) 100 UNIT/ML SOPN Inject 25 units into the skin daily (Patient taking differently: Inject 40 Units into the skin at bedtime. Inject 25 units into the skin daily) 25 pen 0  . insulin lispro (HUMALOG KWIKPEN) 100 UNIT/ML KiwkPen CBG 70-120: 0 units; 121-150: 1 U; 151-200: 2 U; 201-250: 3 U; 251-300: 5 U; 301-350: 7 U; 351-400: 9 U 35 pen 3  . ipratropium (ATROVENT) 0.02 % nebulizer solution Take 0.5 mg by nebulization every 6 (six) hours as needed for wheezing or shortness of breath.    . losartan (COZAAR) 25 MG tablet Take 1 tablet (25 mg total) by mouth daily. (Patient taking differently: Take 25 mg by mouth daily. ON HOLD 12/26/16) 90 tablet 3  . metFORMIN (GLUCOPHAGE-XR) 500 MG 24 hr tablet Take 1 tablet (500 mg total) by mouth 2 (two) times daily. 180 tablet 2  . metolazone (ZAROXOLYN) 2.5 MG tablet Take 1 tablet (2.5 mg total) by mouth daily. 90 tablet 3  . metoprolol succinate (TOPROL-XL) 25 MG 24 hr tablet Take 1 tablet (25 mg total) by mouth 2 (two) times daily. 180 tablet 3  . mirtazapine (REMERON) 15 MG tablet Take 15 mg by mouth at bedtime.    . modafinil (PROVIGIL) 200 MG tablet Take 1 tablet (200 mg total) by mouth daily. 30 tablet 5  . montelukast (SINGULAIR) 10 MG tablet Take 1 tablet (10 mg total) by mouth at bedtime. 30 tablet 6  . nitroGLYCERIN (NITROSTAT) 0.4 MG SL tablet Place 1 tablet (0.4 mg total) under the tongue every 5 (five) minutes as needed for chest pain. 25 tablet 3  . nystatin (NYSTATIN) powder Apply topically 2 (two) times daily. Apply under breasts 15 g 3  . ondansetron (ZOFRAN-ODT) 4 MG disintegrating tablet Take 1 tablet (4 mg total) by mouth 2 (two) times daily as needed for  nausea or vomiting. 30 tablet 0  . oxyCODONE-acetaminophen (PERCOCET/ROXICET) 5-325 MG tablet Take 1 tablet by mouth every 8 (eight) hours as needed for severe pain.    . OXYGEN Inhale 3 L into the lungs continuous. 3 liters 24/7    . pantoprazole (PROTONIX) 20 MG tablet Take  1 tablet (20 mg total) by mouth 2 (two) times daily before a meal. 180 tablet 1  . PRESCRIPTION MEDICATION Inhale into the lungs See admin instructions. Use BIPAP every time laying down    . primidone (MYSOLINE) 50 MG tablet Take 1 tablet (50 mg total) by mouth at bedtime. 90 tablet 3  . Probiotic Product (PROBIOTIC PO) Take 1 tablet by mouth at bedtime.    Marland Kitchen QUEtiapine (SEROQUEL) 25 MG tablet Take 25 mg by mouth at bedtime.  0  . triamcinolone lotion (KENALOG) 0.1 % Apply 1 application topically 3 (three) times daily. For up to 14 days and as needed 60 mL 1  . warfarin (COUMADIN) 4 MG tablet TAKE 1 TABLET BY MOUTH DAILY EXCEPT 1/2 TABLET ON TUESDAYS 30 tablet 1   No current facility-administered medications on file prior to visit.     Allergies  Allergen Reactions  . Xarelto [Rivaroxaban] Other (See Comments)    Internal bleeding  . Ancef [Cefazolin] Nausea And Vomiting  . Levaquin [Levofloxacin In D5w] Other (See Comments)    "afib"  . Lyrica [Pregabalin] Hives  . Tamiflu [Oseltamivir Phosphate] Other (See Comments)    "water blisters"  . Zoloft [Sertraline Hcl] Other (See Comments)    Jaw problems, jittery  . Augmentin [Amoxicillin-Pot Clavulanate] Itching  . Ciprofloxacin Itching and Nausea And Vomiting  . Haldol [Haloperidol] Other (See Comments)    Restless leg  . Nsaids Diarrhea  . Penicillins Itching, Nausea And Vomiting and Rash    Has patient had a PCN reaction causing immediate rash, facial/tongue/throat swelling, SOB or lightheadedness with hypotension: Yes Has patient had a PCN reaction causing severe rash involving mucus membranes or skin necrosis: No Has patient had a PCN reaction that required  hospitalization No Has patient had a PCN reaction occurring within the last 10 years: Yes If all of the above answers are "NO", then may proceed with Cephalosporin use.  . Topamax [Topiramate] Nausea Only    There were no vitals taken for this visit.  No problem-specific Assessment & Plan notes found for this encounter.   Analena Gama Rodriguez-Guzman PharmD, [place] [address] 01/01/2017 7:20 AM

## 2017-01-03 ENCOUNTER — Encounter: Payer: Self-pay | Admitting: Pharmacist

## 2017-01-03 ENCOUNTER — Other Ambulatory Visit: Payer: Self-pay | Admitting: Pharmacist

## 2017-01-04 NOTE — Patient Outreach (Signed)
[facility]) Care Management  01/04/2017  Madie Cahn [facility] 06-May-1951 [id number]   Home visit completed at patient's home. Purpose of the visit was to get the patient to sign a patient assistance application for [employer] and to gather the necessary financial documents to accompany the application.  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  Patient receives her medications in a pill pack from [facility].  [name] medications were reviewed in the home.  Medications Reviewed Today    Reviewed by [name], [facility] (Pharmacist) on 01/03/17 at 1328  Med List Status: <None>  Medication Order Taking? Sig Documenting Provider Last Dose Status Informant  albuterol (PROVENTIL) (2.5 MG/3ML) 0.083% nebulizer solution [id number] Yes Take 2.5 mg by nebulization every 6 (six) hours as needed for wheezing or shortness of breath. [provider] Taking Active Self           Med Note Harrel Lemon, TAMMY T   Wed Oct 02, 2016  9:26 AM)    atorvastatin (LIPITOR) 40 MG tablet [id number] Yes Take 1 tablet (40 mg total) by mouth daily. Rogue Bussing, MD Taking Active         Discontinued 01/03/17 1305 (Patient has not taken in last 30 days)   cetirizine (ZYRTEC) 10 MG tablet [id number] Yes Take 10 mg by mouth at bedtime. [provider] Taking Active Self  cholecalciferol (VITAMIN D) 400 units TABS tablet [id number] Yes Take 1 tablet (400 Units total) by mouth 2 (two) times daily. Rogue Bussing, MD Taking Active   docusate sodium (COLACE) 100 MG capsule [id number] Yes Take 100 mg by mouth daily. [provider] Taking Active Self       Patient not taking:       Discontinued 01/03/17 1303 (Completed Course)   escitalopram (LEXAPRO) 10 MG tablet  [id number] Yes Take 10 mg by mouth at bedtime.  [provider] Taking Active Self  fenofibrate 54 MG tablet [id number] Yes Take 1 tablet (54 mg total) by mouth daily. Philemon Kingdom, MD Taking Active   ferrous sulfate 324 (65 Fe) MG TBEC [id number] Yes Take 1 tablet by mouth daily. [provider] Taking Active Self  fluticasone (FLONASE) 50 MCG/ACT nasal spray [id number] Yes Place 1 spray into both nostrils daily as needed for allergies or rhinitis. Rogue Bussing, MD Taking Active Self  furosemide (LASIX) 40 MG tablet [id number] Yes Take 2 tablets (80 mg total) by mouth 2 (two) times daily. Pixie Casino, MD Taking Active   Insulin Glargine [facility]) 100 UNIT/ML SOPN [id number] Yes Inject 25 units into the skin daily  Patient taking differently:  Inject 40 Units into the skin at bedtime. Inject 25 units into the skin daily   Ola Spurr, Sharman Cheek, MD Taking Active   insulin lispro (HUMALOG KWIKPEN) 100 UNIT/ML KiwkPen [id number]  CBG 70-120: 0 units; 121-150: 1 U; 151-200: 2 U; 201-250: 3 U; 251-300: 5 U; 301-350: 7 U; 351-400: 9 U Fitzgerald, Sharman Cheek, MD  Active            Med Note [name]   Mon Sep 23, 2016 12:45 PM) Patient said she is injecting 24 units in the AM, 28 units at lunch and 28 units at bedtime (uses sliding scale when blood sugar is very high)  ipratropium (ATROVENT)  0.02 % nebulizer solution [id number] Yes Take 0.5 mg by nebulization every 6 (six) hours as needed for wheezing or shortness of breath. [provider] Taking Active Self           Med Note Harrel Lemon, TAMMY T   Wed Oct 02, 2016  9:31 AM)    losartan (COZAAR) 25 MG tablet [id number] No Take 1 tablet (25 mg total) by mouth daily.  Patient not taking:  Reported on 01/03/2017   Pixie Casino, MD Not Taking Active   metFORMIN (GLUCOPHAGE-XR) 500 MG 24 hr tablet [id number] Yes Take 1 tablet (500 mg total) by mouth 2 (two) times daily. Philemon Kingdom, MD Taking Active    metolazone (ZAROXOLYN) 2.5 MG tablet [id number] Yes Take 1 tablet (2.5 mg total) by mouth daily. Pixie Casino, MD Taking Active   metoprolol succinate (TOPROL-XL) 25 MG 24 hr tablet [id number] Yes Take 1 tablet (25 mg total) by mouth 2 (two) times daily. Pixie Casino, MD Taking Active   mirtazapine (REMERON) 15 MG tablet [id number] Yes Take 15 mg by mouth at bedtime. [provider] Taking Active Self  modafinil (PROVIGIL) 200 MG tablet [id number] Yes Take 1 tablet (200 mg total) by mouth daily. Chesley Mires, MD Taking Active   montelukast (SINGULAIR) 10 MG tablet [id number] Yes Take 1 tablet (10 mg total) by mouth at bedtime. Chesley Mires, MD Taking Active   nitroGLYCERIN (NITROSTAT) 0.4 MG SL tablet [id number] No Place 1 tablet (0.4 mg total) under the tongue every 5 (five) minutes as needed for chest pain.  Patient not taking:  Reported on 01/03/2017   Pixie Casino, MD Not Taking Active Self  nystatin (NYSTATIN) powder [id number] Yes Apply topically 2 (two) times daily. Apply under breasts Rogue Bussing, MD Taking Active   ondansetron (ZOFRAN-ODT) 4 MG disintegrating tablet [id number] Yes Take 1 tablet (4 mg total) by mouth 2 (two) times daily as needed for nausea or vomiting. Rogue Bussing, MD Taking Active   oxyCODONE-acetaminophen (PERCOCET/ROXICET) 5-325 MG tablet [id number] Yes Take 1 tablet by mouth every 8 (eight) hours as needed for severe pain. [provider] Taking Active Self  OXYGEN [id number] Yes Inhale 3 L into the lungs continuous. 3 liters 24/7 [provider] Taking Active Self  pantoprazole (PROTONIX) 20 MG tablet [id number] Yes Take 1 tablet (20 mg total) by mouth 2 (two) times daily before a meal. Rogue Bussing, MD Taking Active   PRESCRIPTION MEDICATION [id number] Yes Inhale into the lungs See admin instructions. Use BIPAP every time laying down [provider] Taking Active Self  primidone (MYSOLINE) 50 MG  tablet [id number] Yes Take 1 tablet (50 mg total) by mouth at bedtime. Rogue Bussing, MD Taking Active   Probiotic Product (PROBIOTIC PO) [id number] Yes Take 1 tablet by mouth at bedtime. [provider] Taking Active Self  QUEtiapine (SEROQUEL) 50 MG tablet [id number] Yes Take 50 mg by mouth at bedtime.  Bernita Raisin, MD Taking Active   triamcinolone lotion (KENALOG) 0.1 % [id number] Yes Apply 1 application topically 3 (three) times daily. For up to 14 days and as needed Martinique, Betty G, MD Taking Active Self           Med Note Harrel Lemon, [place] T   Wed Oct 02, 2016  9:33 AM)    vitamin A 10000 UNIT capsule [id number] Yes Take 10,000 Units by mouth every morning. [provider] Taking Active   warfarin (COUMADIN) 4 MG tablet [id number] Yes TAKE 1  TABLET BY MOUTH DAILY EXCEPT 1/2 TABLET ON TUESDAYS Hilty, Nadean Corwin, MD Taking Active            Dose Discrepancy  Quetiapine- Quetiapine 25mg  1 tablet daily was listed on the medication list but Quetiapine 50mg  1 tablet daily was actually inside the pill pack.  The [employer] was called and the Pharmacist verified the original prescription was for Quetiapine 50mg  from Dr> Headen. (Original date of 10/31/16 and was recently refilled 12/03/16)  --Medication list in chart was updated to show the correct strength  Medication Assistance Patient Assistance Application was completed with the patient.  Pertinent required financial documents were also collected.   Plan: Application will be sent to Dr. Chrissie Noa office ASAP.  [name], PharmD, [place] Clinical Pharmacist [phone]

## 2017-01-06 ENCOUNTER — Other Ambulatory Visit: Payer: Self-pay | Admitting: Pharmacist

## 2017-01-06 NOTE — Patient Outreach (Signed)
[facility]) Care Management  01/06/2017  Denise Freeman [facility] Jun 19, 1950 [id number]   [name] [employer] Cares Application was partially completed to assist the patient with obtaining Basaglar and Humalog Claiborne Rigg due to financial hardship.  Partially completed application was faxed to Dr. Chrissie Noa office today with instructions to complete page 8 and 9.  Plan:  Send Dr. Cruzita Lederer an inbasket message in addition to the fax Follow up on the application in 5-7 days   [name], PharmD, [place] Clinical Pharmacist [phone]

## 2017-01-08 ENCOUNTER — Ambulatory Visit (INDEPENDENT_AMBULATORY_CARE_PROVIDER_SITE_OTHER): Payer: Medicare Other | Admitting: Pharmacist

## 2017-01-08 VITALS — BP 134/58 | HR 50 | Wt 188.6 lb

## 2017-01-08 DIAGNOSIS — I4891 Unspecified atrial fibrillation: Secondary | ICD-10-CM

## 2017-01-08 DIAGNOSIS — I5043 Acute on chronic combined systolic (congestive) and diastolic (congestive) heart failure: Secondary | ICD-10-CM | POA: Diagnosis not present

## 2017-01-08 LAB — BASIC METABOLIC PANEL
BUN / CREAT RATIO: 33 — AB (ref 12–28)
BUN: 37 mg/dL — ABNORMAL HIGH (ref 8–27)
CO2: 32 mmol/L — ABNORMAL HIGH (ref 20–29)
CREATININE: 1.12 mg/dL — AB (ref 0.57–1.00)
Calcium: 9.4 mg/dL (ref 8.7–10.3)
Chloride: 89 mmol/L — ABNORMAL LOW (ref 96–106)
GFR calc Af Amer: 60 mL/min/{1.73_m2} (ref >59)
GFR, EST NON AFRICAN AMERICAN: 52 mL/min/{1.73_m2} — AB (ref >59)
Glucose: 240 mg/dL — ABNORMAL HIGH (ref 65–99)
POTASSIUM: 3.8 mmol/L (ref 3.5–5.2)
SODIUM: 142 mmol/L (ref 134–144)

## 2017-01-08 NOTE — Patient Instructions (Addendum)
Return for a  follow up appointment in as needed  Your blood pressure today is 134/58 pulse 50  Check your blood pressure at home daily (if able) and keep record of the readings.  Take your BP meds as follows: *Continue all other medication as prescribed* *Repeat BMET today**  Bring all of your meds, your BP cuff and your record of home blood pressures to your next appointment.  Exercise as you're able, try to walk approximately 30 minutes per day.  Keep salt intake to a minimum, especially watch canned and prepared boxed foods.  Eat more fresh fruits and vegetables and fewer canned items.  Avoid eating in fast food restaurants.    HOW TO TAKE YOUR BLOOD PRESSURE: . Rest 5 minutes before taking your blood pressure. .  Don't smoke or drink caffeinated beverages for at least 30 minutes before. . Take your blood pressure before (not after) you eat. . Sit comfortably with your back supported and both feet on the floor (don't cross your legs). . Elevate your arm to heart level on a table or a desk. . Use the proper sized cuff. It should fit smoothly and snugly around your bare upper arm. There should be enough room to slip a fingertip under the cuff. The bottom edge of the cuff should be 1 inch above the crease of the elbow. . Ideally, take 3 measurements at one sitting and record the average.

## 2017-01-08 NOTE — Progress Notes (Signed)
Patient ID: Roman Sandall [facility]                 DOB: August 11, 1950                      MRN: [id number]     HPI: Monroe Toure is a 66 y.o. female referred by [name] to HTN clinic.  PMH includes atrial fibrillation, HF, OSA, COPD, DM, CKD stage 3, bipolar disorder and dyslipidemia. Patient recently experienced few episode of symptomatic hypotension. Her losartan was placed on hold on 12/26/2016.  Patient presents to HTN clinic and denies problems with dizziness, headaches, increase swelling, shortness of breath or chest pain. Blood pressure is looking better with only 1 reading of 98/75 since losartan HELD.  Current HTN meds:  Furosemide 80mg  daily twice daily Losartan 25mg  - on HOLD Metolazone 2.5mg  daily Metoprolol succinate 25mg  daily  BP goal: 130/80  Family History: diabetes, COPD, heart disease in mother ans sister; hyperlipidemia and heart disease on father  Social History:Former smoker, She has never used smokeless tobacco. She reports that she does not drink alcohol or use drugs.  Diet: low sodium , low carbohydrates, no coffee  Exercise: home daily activities  Home BP readings: HOME wrist BP monitor calibrated today and NOT accurate with > 63mmHg difference from manual reading.  Wt Readings from Last 3 Encounters:  01/08/17 188 lb 9.6 oz (85.5 kg)  12/09/16 184 lb (83.5 kg)  12/05/16 184 lb (83.5 kg)   BP Readings from Last 3 Encounters:  01/08/17 (!) 134/58  12/23/16 100/60  12/05/16 (!) 98/58   Pulse Readings from Last 3 Encounters:  01/08/17 (!) 50  12/23/16 69  12/09/16 78    Renal function: Estimated Creatinine Clearance: 47.5 mL/min (A) (by C-G formula based on SCr of 1.12 mg/dL (H)).  Past Medical History:  Diagnosis Date  . Allergy   . Anemia   . Anxiety   . Asthma   . Atrial fibrillation ([place])   . Bipolar 1 disorder ([place])   . 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 Cholelithiasis    a. 09/2016 s/p Lap Chole.  . Chronic  bronchitis ([place])   . Chronic combined systolic and diastolic congestive heart failure ([place])    a. 09/2016 Echo: EF 30-35%.  . Colon polyps   . COPD (chronic obstructive pulmonary disease) ([place])   . Depression   . Family history of adverse reaction to anesthesia    Uncle was positive for malignant hyperthermia; patient had testing done and was negative.  . Fibromyalgia   . GERD (gastroesophageal reflux disease)   . Gout   . High cholesterol   . History of blood transfusion 06/2015   "bleeding from my rectum"  . History of hiatal hernia   . HOH (hard of hearing)   . Hx of colonic polyps 03/21/2016   3 small adenomas no recall - co-morbidities  . Neuropathy    Disc Back   . NICM (nonischemic cardiomyopathy) ([place])    a. Previously worked up in [place], [place]-->low EF with subsequent recovery.  Multiple caths (last ~ 2014 per pt report)--reportedly nl cors;  b. 09/2016 Echo: EF 30-35%, antsept/apical HK, mild MR, mildly dil LA, mod dil RA;  c. 09/2016 Lexi MV: EF 26%, glob HK, sept DK, med size, mod intensity fixed septal defect - BBB/PVC related artifact, no ischemia.  . On home oxygen therapy    "3L; 24/7" (03/14/2016)  . OSA  treated with BiPAP    uses biPAP, 10 (03/14/2016)  . Osteoarthritis   . Oxygen deficiency   . Pneumonia   . Type II diabetes mellitus ([place])     Current Outpatient Prescriptions on File Prior to Visit  Medication Sig Dispense Refill  . albuterol (PROVENTIL) (2.5 MG/3ML) 0.083% nebulizer solution Take 2.5 mg by nebulization every 6 (six) hours as needed for wheezing or shortness of breath.    Marland Kitchen atorvastatin (LIPITOR) 40 MG tablet Take 1 tablet (40 mg total) by mouth daily. 90 tablet 3  . cetirizine (ZYRTEC) 10 MG tablet Take 10 mg by mouth at bedtime.    . cholecalciferol (VITAMIN D) 400 units TABS tablet Take 1 tablet (400 Units total) by mouth 2 (two) times daily. 180 each 3  . docusate sodium (COLACE) 100 MG capsule Take 100 mg by mouth daily.    Marland Kitchen escitalopram  (LEXAPRO) 10 MG tablet Take 10 mg by mouth at bedtime.     . fenofibrate 54 MG tablet Take 1 tablet (54 mg total) by mouth daily. 90 tablet 3  . ferrous sulfate 324 (65 Fe) MG TBEC Take 1 tablet by mouth daily.    . fluticasone (FLONASE) 50 MCG/ACT nasal spray Place 1 spray into both nostrils daily as needed for allergies or rhinitis. 32 g 6  . furosemide (LASIX) 40 MG tablet Take 2 tablets (80 mg total) by mouth 2 (two) times daily. 360 tablet 3  . Insulin Glargine (BASAGLAR KWIKPEN) 100 UNIT/ML SOPN Inject 25 units into the skin daily (Patient taking differently: Inject 40 Units into the skin at bedtime. Inject 25 units into the skin daily) 25 pen 0  . insulin lispro (HUMALOG KWIKPEN) 100 UNIT/ML KiwkPen CBG 70-120: 0 units; 121-150: 1 U; 151-200: 2 U; 201-250: 3 U; 251-300: 5 U; 301-350: 7 U; 351-400: 9 U 35 pen 3  . ipratropium (ATROVENT) 0.02 % nebulizer solution Take 0.5 mg by nebulization every 6 (six) hours as needed for wheezing or shortness of breath.    . metFORMIN (GLUCOPHAGE-XR) 500 MG 24 hr tablet Take 1 tablet (500 mg total) by mouth 2 (two) times daily. 180 tablet 2  . metolazone (ZAROXOLYN) 2.5 MG tablet Take 1 tablet (2.5 mg total) by mouth daily. 90 tablet 3  . metoprolol succinate (TOPROL-XL) 25 MG 24 hr tablet Take 1 tablet (25 mg total) by mouth 2 (two) times daily. 180 tablet 3  . mirtazapine (REMERON) 15 MG tablet Take 15 mg by mouth at bedtime.    . modafinil (PROVIGIL) 200 MG tablet Take 1 tablet (200 mg total) by mouth daily. 30 tablet 5  . montelukast (SINGULAIR) 10 MG tablet Take 1 tablet (10 mg total) by mouth at bedtime. 30 tablet 6  . nitroGLYCERIN (NITROSTAT) 0.4 MG SL tablet Place 1 tablet (0.4 mg total) under the tongue every 5 (five) minutes as needed for chest pain. (Patient not taking: Reported on 01/03/2017) 25 tablet 3  . nystatin (NYSTATIN) powder Apply topically 2 (two) times daily. Apply under breasts 15 g 3  . ondansetron (ZOFRAN-ODT) 4 MG disintegrating  tablet Take 1 tablet (4 mg total) by mouth 2 (two) times daily as needed for nausea or vomiting. 30 tablet 0  . oxyCODONE-acetaminophen (PERCOCET/ROXICET) 5-325 MG tablet Take 1 tablet by mouth every 8 (eight) hours as needed for severe pain.    . OXYGEN Inhale 3 L into the lungs continuous. 3 liters 24/7    . pantoprazole (PROTONIX) 20 MG tablet Take 1 tablet (  20 mg total) by mouth 2 (two) times daily before a meal. 180 tablet 1  . PRESCRIPTION MEDICATION Inhale into the lungs See admin instructions. Use BIPAP every time laying down    . primidone (MYSOLINE) 50 MG tablet Take 1 tablet (50 mg total) by mouth at bedtime. 90 tablet 3  . Probiotic Product (PROBIOTIC PO) Take 1 tablet by mouth at bedtime.    Marland Kitchen QUEtiapine (SEROQUEL) 50 MG tablet Take 50 mg by mouth at bedtime.   0  . triamcinolone lotion (KENALOG) 0.1 % Apply 1 application topically 3 (three) times daily. For up to 14 days and as needed 60 mL 1  . vitamin A 10000 UNIT capsule Take 10,000 Units by mouth every morning.    . warfarin (COUMADIN) 4 MG tablet TAKE 1 TABLET BY MOUTH DAILY EXCEPT 1/2 TABLET ON TUESDAYS 30 tablet 1   No current facility-administered medications on file prior to visit.     Allergies  Allergen Reactions  . Xarelto [Rivaroxaban] Other (See Comments)    Internal bleeding  . Ancef [Cefazolin] Nausea And Vomiting  . Levaquin [Levofloxacin In D5w] Other (See Comments)    "afib"  . Lyrica [Pregabalin] Hives  . Tamiflu [Oseltamivir Phosphate] Other (See Comments)    "water blisters"  . Zoloft [Sertraline Hcl] Other (See Comments)    Jaw problems, jittery  . Augmentin [Amoxicillin-Pot Clavulanate] Itching  . Ciprofloxacin Itching and Nausea And Vomiting  . Haldol [Haloperidol] Other (See Comments)    Restless leg  . Nsaids Diarrhea  . Penicillins Itching, Nausea And Vomiting and Rash    Has patient had a PCN reaction causing immediate rash, facial/tongue/throat swelling, SOB or lightheadedness with  hypotension: Yes Has patient had a PCN reaction causing severe rash involving mucus membranes or skin necrosis: No Has patient had a PCN reaction that required hospitalization No Has patient had a PCN reaction occurring within the last 10 years: Yes If all of the above answers are "NO", then may proceed with Cephalosporin use.  . Topamax [Topiramate] Nausea Only    Blood pressure (!) 134/58, pulse (!) 50, weight 188 lb 9.6 oz (85.5 kg), SpO2 94 %.  Acute on chronic combined systolic and diastolic CHF (congestive heart failure) (HCC) Noted weight gain of 4 pounds since last office visit with Dr Debara Pickett 1 months ago. Patient reports her weight is stable at home with no changes greater than 1 pound in less than 48 hours. Also reports weight gain related to increase food intake and denies shortness of breath or swelling. No new recommendations were made today for her diuretics. Will repeat BMET to assess renal function.  Blood pressure much improved since losartan was placed on hold. Only 1 episode of asymptomatic hypotension was noted from home records but her home BP meter was determined to be inaccurate. Will discontinue losartan 25mg  today and continue all other medication as prescribed.  Patient will acquire a new BP monitor (arm cuff recommended) and continue daily BP monitoring. Home nurse continues to visit every month as well. Will follow up by phone in 2 weeks and follow up in clinic in 6 weeks if BP remains appropriate.   Mithcell Schumpert Rodriguez-Guzman PharmD, [place] [id number] 01/08/2017 7:41 PM

## 2017-01-08 NOTE — Assessment & Plan Note (Signed)
Noted weight gain of 4 pounds since last office visit with Dr Debara Pickett 1 months ago. Patient reports her weight is stable at home with no changes greater than 1 pound in less than 48 hours. Also reports weight gain related to increase food intake and denies shortness of breath or swelling. No new recommendations were made today for her diuretics. Will repeat BMET to assess renal function.  Blood pressure much improved since losartan was placed on hold. Only 1 episode of asymptomatic hypotension was noted from home records but her home BP meter was determined to be inaccurate. Will discontinue losartan 25mg  today and continue all other medication as prescribed.  Patient will acquire a new BP monitor (arm cuff recommended) and continue daily BP monitoring. Home nurse continues to visit every month as well. Will follow up by phone in 2 weeks and follow up in clinic in 6 weeks if BP remains appropriate.

## 2017-01-09 ENCOUNTER — Telehealth: Payer: Self-pay | Admitting: Internal Medicine

## 2017-01-09 NOTE — Telephone Encounter (Signed)
Denise Freeman is returning a call . Thanks

## 2017-01-09 NOTE — Telephone Encounter (Signed)
LM w/results   Notes recorded by Pixie Casino, MD on 01/09/2017 at 7:54 AM EDT Creatinine stable/improved on BMET.  Dr. Lemmie Evens

## 2017-01-10 ENCOUNTER — Ambulatory Visit (INDEPENDENT_AMBULATORY_CARE_PROVIDER_SITE_OTHER): Payer: Medicare Other | Admitting: Pulmonary Disease

## 2017-01-10 ENCOUNTER — Encounter: Payer: Self-pay | Admitting: Pulmonary Disease

## 2017-01-10 VITALS — BP 112/56 | HR 74 | Ht 59.0 in | Wt 189.0 lb

## 2017-01-10 DIAGNOSIS — J9610 Chronic respiratory failure, unspecified whether with hypoxia or hypercapnia: Secondary | ICD-10-CM | POA: Diagnosis not present

## 2017-01-10 DIAGNOSIS — J9611 Chronic respiratory failure with hypoxia: Secondary | ICD-10-CM

## 2017-01-10 DIAGNOSIS — G4733 Obstructive sleep apnea (adult) (pediatric): Secondary | ICD-10-CM

## 2017-01-10 DIAGNOSIS — J449 Chronic obstructive pulmonary disease, unspecified: Secondary | ICD-10-CM | POA: Diagnosis not present

## 2017-01-10 MED ORDER — MONTELUKAST SODIUM 10 MG PO TABS: 10.0000 mg | ORAL_TABLET | Freq: Every day | ORAL | 6 refills | Status: DC

## 2017-01-10 MED ORDER — MOMETASONE FUROATE 50 MCG/ACT NA SUSP: 1.0000 | Freq: Every day | NASAL | 11 refills | Status: DC

## 2017-01-10 MED ORDER — ALBUTEROL SULFATE (2.5 MG/3ML) 0.083% IN NEBU: 2.5000 mg | INHALATION_SOLUTION | Freq: Four times a day (QID) | RESPIRATORY_TRACT | 5 refills | Status: DC | PRN

## 2017-01-10 MED ORDER — IPRATROPIUM BROMIDE 0.02 % IN SOLN: 0.5000 mg | Freq: Four times a day (QID) | RESPIRATORY_TRACT | 5 refills | Status: DC | PRN

## 2017-01-10 NOTE — Patient Instructions (Signed)
Follow up in 6 months 

## 2017-01-10 NOTE — Progress Notes (Signed)
Current Outpatient Prescriptions on File Prior to Visit  Medication Sig  . atorvastatin (LIPITOR) 40 MG tablet Take 1 tablet (40 mg total) by mouth daily.  . cetirizine (ZYRTEC) 10 MG tablet Take 10 mg by mouth at bedtime.  . cholecalciferol (VITAMIN D) 400 units TABS tablet Take 1 tablet (400 Units total) by mouth 2 (two) times daily.  Marland Kitchen docusate sodium (COLACE) 100 MG capsule Take 100 mg by mouth daily.  Marland Kitchen escitalopram (LEXAPRO) 10 MG tablet Take 10 mg by mouth at bedtime.   . fenofibrate 54 MG tablet Take 1 tablet (54 mg total) by mouth daily.  . ferrous sulfate 324 (65 Fe) MG TBEC Take 1 tablet by mouth daily.  . furosemide (LASIX) 40 MG tablet Take 2 tablets (80 mg total) by mouth 2 (two) times daily.  . Insulin Glargine (BASAGLAR KWIKPEN) 100 UNIT/ML SOPN Inject 25 units into the skin daily (Patient taking differently: Inject 40 Units into the skin at bedtime. Inject 25 units into the skin daily)  . insulin lispro (HUMALOG KWIKPEN) 100 UNIT/ML KiwkPen CBG 70-120: 0 units; 121-150: 1 U; 151-200: 2 U; 201-250: 3 U; 251-300: 5 U; 301-350: 7 U; 351-400: 9 U  . metFORMIN (GLUCOPHAGE-XR) 500 MG 24 hr tablet Take 1 tablet (500 mg total) by mouth 2 (two) times daily.  . metolazone (ZAROXOLYN) 2.5 MG tablet Take 1 tablet (2.5 mg total) by mouth daily.  . metoprolol succinate (TOPROL-XL) 25 MG 24 hr tablet Take 1 tablet (25 mg total) by mouth 2 (two) times daily.  . mirtazapine (REMERON) 15 MG tablet Take 15 mg by mouth at bedtime.  . modafinil (PROVIGIL) 200 MG tablet Take 1 tablet (200 mg total) by mouth daily.  . nitroGLYCERIN (NITROSTAT) 0.4 MG SL tablet Place 1 tablet (0.4 mg total) under the tongue every 5 (five) minutes as needed for chest pain.  Marland Kitchen nystatin (NYSTATIN) powder Apply topically 2 (two) times daily. Apply under breasts  . ondansetron (ZOFRAN-ODT) 4 MG disintegrating tablet Take 1 tablet (4 mg total) by mouth 2 (two) times daily as needed for nausea or vomiting.  Marland Kitchen  oxyCODONE-acetaminophen (PERCOCET/ROXICET) 5-325 MG tablet Take 1 tablet by mouth every 8 (eight) hours as needed for severe pain.  . OXYGEN Inhale 3 L into the lungs continuous. 3 liters 24/7  . pantoprazole (PROTONIX) 20 MG tablet Take 1 tablet (20 mg total) by mouth 2 (two) times daily before a meal.  . PRESCRIPTION MEDICATION Inhale into the lungs See admin instructions. Use BIPAP every time laying down  . primidone (MYSOLINE) 50 MG tablet Take 1 tablet (50 mg total) by mouth at bedtime.  . Probiotic Product (PROBIOTIC PO) Take 1 tablet by mouth at bedtime.  Marland Kitchen QUEtiapine (SEROQUEL) 50 MG tablet Take 50 mg by mouth at bedtime.   . triamcinolone lotion (KENALOG) 0.1 % Apply 1 application topically 3 (three) times daily. For up to 14 days and as needed  . vitamin A 10000 UNIT capsule Take 10,000 Units by mouth every morning.  . warfarin (COUMADIN) 4 MG tablet TAKE 1 TABLET BY MOUTH DAILY EXCEPT 1/2 TABLET ON TUESDAYS   No current facility-administered medications on file prior to visit.     Chief Complaint  Patient presents with  . Follow-up    Pt states that her Flonase is not working and she wants to change. Pt states that there has not been much change since last OV withher breathing. Pt notes humid hot weather causes flares.     Sleep  tests PSG 1991 Bipap 11/01/15 to 01/29/16 >> used on 89 of 90 nights with average 10 hrs 19 min.  Average AHI 2.4 with Bipap 16/10 cm H2O  Cardiac tests Echo 09/07/16 >> EF 30 to 35%, mild MR  Pulmonary tests PFT 10/24/14 >> FEV1 0.64 (30%), FEV1% 65, TLC 2.92 (64%), DLCO 54%, + BD  Past medical history DM, CAD, A fib, Systolic CHF, Bipolar, Fibromyalgia, HH, GERD  Past surgical history, Family history, Social history, Allergies reviewed  Vital signs BP (!) 112/56 (BP Location: Right Arm, Cuff Size: Normal)   Pulse 74   Ht 4\' 11"  (1.499 m)   Wt 189 lb (85.7 kg)   SpO2 99%   BMI 38.17 kg/m   History of Present Illness: Denise Freeman  is a 66 y.o. female former smoker with COPD, OSA, and chronic hypoxic/hypercapnic respiratory failure with OHS.  She has lost weight since last visit.  This has helped her breathing.  Using 3 liters 24/7 and Bipap at night.  Not having cough, wheeze, or sputum.  Able to walk around more independently.   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 - continue budesonide, ipratropium, and albuterol - continue singulair  Obstructive sleep apnea. - she is compliant with Bipap and reports benefit - continue Bipap 16/10 cm H2O  Chronic respiratory failure 2nd to COPD and Obesity hypoventilation syndrome. - continue 3 liters 14/1  Chronic systolic heart failure. - f/u with cardiology  Goals of Care. - DNR/DNI   Patient Instructions  Follow up in 6 months    Chesley Mires, MD [place] [facility] Pager:  [phone] 01/10/2017, 10:46 AM

## 2017-01-14 ENCOUNTER — Other Ambulatory Visit: Payer: Self-pay | Admitting: Pharmacist

## 2017-01-14 DIAGNOSIS — M5136 Other intervertebral disc degeneration, lumbar region: Secondary | ICD-10-CM | POA: Diagnosis not present

## 2017-01-14 DIAGNOSIS — G894 Chronic pain syndrome: Secondary | ICD-10-CM | POA: Diagnosis not present

## 2017-01-14 NOTE — Patient Outreach (Signed)
[facility]) Care Management  01/14/2017  Jaemarie Hochberg [facility] 1951-02-23 [id number]   Called Dr. Arman Filter office as we had not received the signed pages of the [facility] Patient Assistance program. The fax number was verified and it looks as though the blank application was sent to the Neurology side of the office and was not delivered to Dr. Cruzita Lederer.  The application was re-faxed to today.  Plan:  Follow up with the office in 1-2 business days if not received.   [name], PharmD, [place] Clinical Pharmacist [phone]

## 2017-01-17 ENCOUNTER — Other Ambulatory Visit: Payer: Self-pay | Admitting: Pharmacist

## 2017-01-17 ENCOUNTER — Telehealth: Payer: Self-pay | Admitting: Internal Medicine

## 2017-01-17 NOTE — Telephone Encounter (Signed)
THN called in reference to see if you received patient's assistance application for her insulin that needed a signature. [facility]N asked for this to be faxed back but has not received anything. Fax number is  [phone]

## 2017-01-17 NOTE — Patient Outreach (Signed)
[facility]) Care Management  01/17/2017  Denise Freeman [facility] 04/30/1951 [id number]   Called patient to let her know the status of her patient assistance application. HIPAA identifiers were obtained.  Patient shared that she went to see Dr. Rosine Door yesterday (psych) but he could not see her because their office no longer takes [employer].    Plan: A note will be sent to [place] Worker to help patient find a new psych provider.  I will follow up with the patient in 3 business days on the patient's patient assistance application.   [name], PharmD, [place] Clinical Pharmacist [phone]

## 2017-01-20 ENCOUNTER — Other Ambulatory Visit: Payer: Self-pay | Admitting: *Deleted

## 2017-01-20 ENCOUNTER — Telehealth: Payer: Self-pay | Admitting: Internal Medicine

## 2017-01-20 NOTE — Patient Outreach (Signed)
[facility]) Care Management   01/20/2017  Londin [facility] May 03, 1951 [id number]  Jazmynn Pho Coudriet is an 66 y.o. female  Subjective: "I'm trying but not getting anywhere"   Objective:   BP 139/75   Pulse 80   Temp 97.6 F (36.4 C) (Oral)   Resp 20   SpO2 98%   Review of Systems  HENT: Negative.   Eyes: Negative.   Respiratory: Negative.   Cardiovascular: Positive for leg swelling.  Gastrointestinal: Negative.   Genitourinary: Negative.   Musculoskeletal: Positive for joint pain.  Skin: Negative.   Neurological: Positive for weakness. Negative for dizziness, tingling, tremors, sensory change, speech change, focal weakness, seizures, loss of consciousness and headaches.  Endo/Heme/Allergies: Bruises/bleeds easily.  Psychiatric/Behavioral: The patient is nervous/anxious.     Physical Exam  Constitutional: She is oriented to person, place, and time. She appears well-developed and well-nourished.  HENT:  Head: Normocephalic and atraumatic.  Eyes: Pupils are equal, round, and reactive to light. Conjunctivae are normal.  Neck: Normal range of motion. Neck supple.  Cardiovascular: Normal rate and regular rhythm.   Respiratory: Effort normal and breath sounds normal.  GI: Soft. Bowel sounds are normal.  Musculoskeletal: Normal range of motion.  Neurological: She is alert and oriented to person, place, and time.  Psychiatric: She has a normal mood and affect. Her behavior is normal. Judgment and thought content normal.    Encounter Medications:   Outpatient Encounter Prescriptions as of 01/20/2017  Medication Sig Note  . albuterol (PROVENTIL) (2.5 MG/3ML) 0.083% nebulizer solution Take 3 mLs (2.5 mg total) by nebulization every 6 (six) hours as needed for wheezing or shortness of breath.   Marland Kitchen atorvastatin (LIPITOR) 40 MG tablet Take 1 tablet (40 mg total) by mouth daily.   . cetirizine (ZYRTEC) 10 MG tablet Take 10 mg by mouth at bedtime.   .  cholecalciferol (VITAMIN D) 400 units TABS tablet Take 1 tablet (400 Units total) by mouth 2 (two) times daily.   Marland Kitchen docusate sodium (COLACE) 100 MG capsule Take 100 mg by mouth daily.   Marland Kitchen escitalopram (LEXAPRO) 10 MG tablet Take 10 mg by mouth at bedtime.    . fenofibrate 54 MG tablet Take 1 tablet (54 mg total) by mouth daily.   . ferrous sulfate 324 (65 Fe) MG TBEC Take 1 tablet by mouth daily.   . furosemide (LASIX) 40 MG tablet Take 2 tablets (80 mg total) by mouth 2 (two) times daily.   . Insulin Glargine (BASAGLAR KWIKPEN) 100 UNIT/ML SOPN Inject 25 units into the skin daily (Patient taking differently: Inject 40 Units into the skin at bedtime. Inject 25 units into the skin daily)   . insulin lispro (HUMALOG KWIKPEN) 100 UNIT/ML KiwkPen CBG 70-120: 0 units; 121-150: 1 U; 151-200: 2 U; 201-250: 3 U; 251-300: 5 U; 301-350: 7 U; 351-400: 9 U 09/23/2016: Patient said she is injecting 24 units in the AM, 28 units at lunch and 28 units at bedtime (uses sliding scale when blood sugar is very high)  . ipratropium (ATROVENT) 0.02 % nebulizer solution Take 2.5 mLs (0.5 mg total) by nebulization every 6 (six) hours as needed for wheezing or shortness of breath.   . metFORMIN (GLUCOPHAGE-XR) 500 MG 24 hr tablet Take 1 tablet (500 mg total) by mouth 2 (two) times daily.   . metolazone (ZAROXOLYN) 2.5 MG tablet Take 1 tablet (2.5 mg total) by mouth daily.   . metoprolol succinate (TOPROL-XL) 25 MG 24 hr tablet Take 1  tablet (25 mg total) by mouth 2 (two) times daily.   . mirtazapine (REMERON) 15 MG tablet Take 15 mg by mouth at bedtime.   . modafinil (PROVIGIL) 200 MG tablet Take 1 tablet (200 mg total) by mouth daily.   . mometasone (NASONEX) 50 MCG/ACT nasal spray Place 1 spray into the nose daily.   . montelukast (SINGULAIR) 10 MG tablet Take 1 tablet (10 mg total) by mouth at bedtime.   . nitroGLYCERIN (NITROSTAT) 0.4 MG SL tablet Place 1 tablet (0.4 mg total) under the tongue every 5 (five) minutes as  needed for chest pain.   Marland Kitchen nystatin (NYSTATIN) powder Apply topically 2 (two) times daily. Apply under breasts   . ondansetron (ZOFRAN-ODT) 4 MG disintegrating tablet Take 1 tablet (4 mg total) by mouth 2 (two) times daily as needed for nausea or vomiting.   Marland Kitchen oxyCODONE-acetaminophen (PERCOCET/ROXICET) 5-325 MG tablet Take 1 tablet by mouth every 8 (eight) hours as needed for severe pain.   . OXYGEN Inhale 3 L into the lungs continuous. 3 liters 24/7   . pantoprazole (PROTONIX) 20 MG tablet Take 1 tablet (20 mg total) by mouth 2 (two) times daily before a meal.   . PRESCRIPTION MEDICATION Inhale into the lungs See admin instructions. Use BIPAP every time laying down   . primidone (MYSOLINE) 50 MG tablet Take 1 tablet (50 mg total) by mouth at bedtime.   . Probiotic Product (PROBIOTIC PO) Take 1 tablet by mouth at bedtime.   Marland Kitchen QUEtiapine (SEROQUEL) 50 MG tablet Take 50 mg by mouth at bedtime.    . triamcinolone lotion (KENALOG) 0.1 % Apply 1 application topically 3 (three) times daily. For up to 14 days and as needed   . vitamin A 10000 UNIT capsule Take 10,000 Units by mouth every morning.   . warfarin (COUMADIN) 4 MG tablet TAKE 1 TABLET BY MOUTH DAILY EXCEPT 1/2 TABLET ON TUESDAYS    No facility-administered encounter medications on file as of 01/20/2017.     Functional Status:   In your present state of health, do you have any difficulty performing the following activities: 10/16/2016 09/23/2016  Hearing? Y Y  Comment has a hearing aide, but doesn't always wear them -  Vision? [name]  Comment wears glasses, but is legally blind -  Difficulty concentrating or making decisions? Y N  Walking or climbing stairs? Y Y  Dressing or bathing? N N  Doing errands, shopping? N Y  Some recent data might be hidden    Fall/Depression Screening:    Fall Risk  01/03/2017 12/12/2016 11/26/2016  Falls in the past year? Yes Yes No  Number falls in past yr: 2 or more - -  Injury with Fall? No Yes -  Risk  Factor Category  - - -  Comment - - -  Risk for fall due to : Other (Comment);Medication side effect Other (Comment);Impaired vision -  Risk for fall due to: Comment hypoglycemia patient fell out of her bed -  Follow up - - -   PHQ 2/9 Scores 12/12/2016 12/05/2016 11/26/2016 11/04/2016 10/16/2016 09/06/2016 07/18/2016  PHQ - 2 Score 2 0 0 0 2 0 0  PHQ- 9 Score 5 - - - 9 - -    Assessment:    Mrs Canche is at her dining room table working on trying to get her diabetic shoes and needing another behavior health provider after she states Dr Rosine Door office, "bipolar doctor dropped me"  Diabetic shoes Confirmed number for  foot solutions is incorrect [facility] CM and Mrs Steers called Dr Wardell Heath Endocrinologist - spoke with Lumen at Dr Cruzita Lederer office who sent a message to [facility] Dr Cruzita Lederer medical assistant requesting a call to [place] or Mrs Velazco. Lumen confirmed that providers in their office do assist with diabetic shoes Mrs Grudzien was informed by [facility]  Prosthetics staff informed her that forms had been sent to Dr Cruzita Lederer office but she has not completed and returned them  Mrs Jamroz reports she went to see her behavioral health provider, Dr Rosine Door but was informed she could not be see because the office is reporting she has blue medicare as primary Mrs Waddle states her medicare is primary and she attempted to share this with the provider without success Mrs Gapinski called [employer] and was given a list of local behavioral health providers [facility] CM and Mrs Shell called Shamsher Ahluwalia in [place] at [phone], A voice message was left [facility] CM and Mrs Risby called Dr Rosine Door office [phone] for [place]  went with Mrs Perno to office to complete medical release form at [place] at [place]  Mrs Berland has purchased a new BP cuff - [facility] CM compared BP readings for her new BP cuff and the manual reading obtained today Her machine states 139//75 hr   75   Plan:  Mrs Predmore will be seen in 4 weeks for a follow up home visit [facility] CM will continue to assist with finding a new Behavioral health provider Route notes per pertinent medical providers in [place]. Lavina Hamman, RN, BSN, [place] Care Management [phone]

## 2017-01-20 NOTE — Telephone Encounter (Signed)
If they are needed (I am not aware of this), I can do the foot exam at next visit and can fill out any form afterwards.

## 2017-01-20 NOTE — Patient Outreach (Signed)
Request received from Kelly Harrison, LCSW to mail patient personal care resources.  Information mailed today. 

## 2017-01-20 NOTE — Telephone Encounter (Signed)
Patient needs diabetic shoes? Denise Freeman w/ [place] coordinator at patients home and would like a call back asap.

## 2017-01-20 NOTE — Telephone Encounter (Signed)
Would you do diabetic shoes?

## 2017-01-20 NOTE — Telephone Encounter (Signed)
I have been faxing over information with no luck going through, will try again.

## 2017-01-20 NOTE — Telephone Encounter (Signed)
Faxed over information

## 2017-01-21 ENCOUNTER — Other Ambulatory Visit: Payer: Self-pay | Admitting: *Deleted

## 2017-01-21 NOTE — Telephone Encounter (Signed)
Called Jackelyn Poling with [facility], no questions at this time. I advised of MD note.

## 2017-01-21 NOTE — Patient Outreach (Signed)
[facility]) Care Management  01/21/2017  Denise Freeman [facility] [birth year] [id number]  Care coordination Received a call from Dr Cruzita Lederer nurse Almyra Free reporting that Dr Cruzita Lederer can assist Mrs [facility] with what is needed for Diabetic shoes  (forms, Rx, etc) after being examined on next scheduled endocrinology visit on 01/27/17 Last visit noted in February 2018 and a May 2018 visit had been cancelled per Almyra Free.  Plans [facility] Called to inform [name] of the contact from Almyra Free and that she would be assisted with DM shoes after exam on 01/27/17 Follow up with [name] in 4 weeks    Kimberly L. Lavina Hamman RN, BSN, CCM [facility] Care Management [phone]'

## 2017-01-22 ENCOUNTER — Other Ambulatory Visit: Payer: Self-pay | Admitting: Pharmacist

## 2017-01-22 ENCOUNTER — Other Ambulatory Visit: Payer: Self-pay | Admitting: *Deleted

## 2017-01-22 NOTE — Patient Outreach (Signed)
[facility]) Care Management  01/22/2017  Denise Freeman [facility] [birth year] [id number]   Care coordination Denise Freeman called [facility] CM to confirm again her endocrinology exam on 01/27/17 and to get DM shoe Rx from Dr Cruzita Lederer [facility] CM and Denise Freeman reviewed her appointment scheduling for the rest of August, September and October of 2018  Plans [facility] CM will see Denise Freeman in 4 weeks as scheduled for home visit   [place]. Lavina Hamman, RN, BSN, [place] Care Management [phone]

## 2017-01-22 NOTE — Patient Outreach (Signed)
[facility] [facility]) Care Management  01/22/2017  [place] [facility] 16-Nov-1950 [id number]   Prescriptions and signed application was received from Dr. Renne Crigler.  Application and all supporting materials were  faxed to [employer] for [employer] and Humalog Kwik Pen on the patient's behalf. Patient was called to let her know the status of the application.  She did not answer the phone. HIPAA compliant message left on the patient's voicemail.  Plan:  Follow up with the patient and [employer] in 5-7 business days.   [name], PharmD, [place] Clinical Pharmacist [phone]

## 2017-01-23 NOTE — Addendum Note (Signed)
Addendum  created 01/23/17 1300 by Roberts Gaudy, MD   Sign clinical note

## 2017-01-27 ENCOUNTER — Encounter: Payer: Self-pay | Admitting: Internal Medicine

## 2017-01-27 ENCOUNTER — Ambulatory Visit (INDEPENDENT_AMBULATORY_CARE_PROVIDER_SITE_OTHER): Payer: Medicare Other | Admitting: Internal Medicine

## 2017-01-27 ENCOUNTER — Other Ambulatory Visit: Payer: Self-pay

## 2017-01-27 VITALS — BP 120/74 | HR 101 | Wt 190.0 lb

## 2017-01-27 DIAGNOSIS — E11319 Type 2 diabetes mellitus with unspecified diabetic retinopathy without macular edema: Secondary | ICD-10-CM | POA: Diagnosis not present

## 2017-01-27 DIAGNOSIS — E782 Mixed hyperlipidemia: Secondary | ICD-10-CM | POA: Diagnosis not present

## 2017-01-27 DIAGNOSIS — Z23 Encounter for immunization: Secondary | ICD-10-CM | POA: Diagnosis not present

## 2017-01-27 DIAGNOSIS — Z794 Long term (current) use of insulin: Secondary | ICD-10-CM | POA: Diagnosis not present

## 2017-01-27 LAB — POCT GLYCOSYLATED HEMOGLOBIN (HGB A1C): Hemoglobin A1C: 7.9

## 2017-01-27 MED ORDER — INSULIN PEN NEEDLE 32G X 4 MM MISC: 5 refills | Status: DC

## 2017-01-27 MED ORDER — INSULIN LISPRO 100 UNIT/ML (KWIKPEN): PEN_INJECTOR | SUBCUTANEOUS | 3 refills | Status: DC

## 2017-01-27 MED ORDER — BASAGLAR KWIKPEN 100 UNIT/ML ~~LOC~~ SOPN: 30.0000 [IU] | PEN_INJECTOR | Freq: Two times a day (BID) | SUBCUTANEOUS | 5 refills | Status: DC

## 2017-01-27 MED ORDER — FENOFIBRATE 54 MG PO TABS: 54.0000 mg | ORAL_TABLET | Freq: Every day | ORAL | 3 refills | Status: DC

## 2017-01-27 MED ORDER — METFORMIN HCL ER 500 MG PO TB24: 500.0000 mg | ORAL_TABLET | Freq: Two times a day (BID) | ORAL | 2 refills | Status: DC

## 2017-01-27 NOTE — Progress Notes (Signed)
Patient ID: Denise Freeman, female   DOB: 14-Mar-1951, 66 y.o.   MRN: [id number]  HPI: Denise Freeman is a 66 y.o.-year-old female, initially referred by her PCP, Dr. Loman Chroman, for management of DM2, dx in 2009, insulin-dependent in 2013, uncontrolled, with complications (CKD, DR, PN). Last visit 4 mo ago.  Last hemoglobin A1c was: Lab Results  Component Value Date   HGBA1C 8.1 09/27/2016   HGBA1C 7.5 07/22/2016   HGBA1C 8.9 (H) 03/14/2016  12/30/2013: HbA1c 8.6% 12/28/2013: HbA1c 7.9% HbA1c 8.7% HbA1c 9.4% HbA1c 7.3%  Pt is on a regimen of:  - Metformin ER 500 mg 2x a day  - Toujeo >> Basaglar 40 units in am and 40 units at bedtime - Humalog:  - 20 units before b'fast - 24 units before a larger meal with lunch and dinner   We stopped Trulicity 9.47 mg under skin weekly >> too expensive We stopped Januvia >> did not help She was on Metformin >> nausea.  Pt checks her sugars 3x a day (no log again) - no clear lows, but she describes feeling her sugars are dropping after meals: - am: 150-180 (better lately) >> 160-170 >> 140-150, 160 >> 80-230 - 2h after b'fast: n/c >> 34x1 >> n/c - before lunch:  134-170 >> 155 >> 145-149 >> 140s >> 180 - 2h after lunch: n/c >> 160 >> n/c - before dinner: 140-188 >> 165-170 >> 165 >> 140s >> 180-190s - 2h after dinner: n/c >> 180-190 >> n/c - bedtime:190s >> 140 (drinks tomato juice) >> 177 >> 200s - nighttime:n/c Lowest sugar was 34x1 >> 140 >> 140s >> 80; she has hypoglycemia awareness at 70. Highest sugar was 200s >> 200s. Glucometer: FreeStyle  Pt's meals are: - Breakfast: toast with spread or oatmeal with splenda - Lunch: cottage cheese, or fruit - Dinner: chicken/beef/turkey with vegetables and a starch; macaroni and cheese - Snacks: 2 snacks   - + CKD stage 3, last BUN/creatinine:  Lab Results  Component Value Date   BUN 37 (H) 01/08/2017   CREATININE 1.12 (H) 01/08/2017  On Lisinopril. - last set of lipids: Lab  Results  Component Value Date   CHOL 148 09/10/2016   HDL 24 (L) 09/10/2016   LDLCALC 94 09/10/2016   LDLDIRECT 153.0 04/19/2016   TRIG 150 (H) 09/10/2016   CHOLHDL 6.2 09/10/2016  09/07/2013: 180/135/31/138 On Crestor, Fenofibrate. - last eye exam was in 01/2016 >> + DR OU. She had cataract sx in 05 and 11/2014. - No numbness and tingling in her feet.  She was admitted 03/14/16 with melena. She was on Xarelto then. Her EGD was normal, while the colonoscopy did not show a clear cause of bleeding - reviewed reports. Since then, she stopped Xarelto and is now on ASA 81.  ROS: Constitutional: no weight gain/no weight loss, no fatigue, no subjective hyperthermia, no subjective hypothermia Eyes: no blurry vision, no xerophthalmia ENT: no sore throat, no nodules palpated in throat, no dysphagia, no odynophagia, no hoarseness Cardiovascular: no CP/no SOB/no palpitations/no leg swelling Respiratory: no cough/no SOB/no wheezing Gastrointestinal: no N/no V/no D/no C/no acid reflux Musculoskeletal: no muscle aches/+ joint aches Skin: no rashes, no hair loss Neurological: no tremors/no numbness/no tingling/no dizziness  I reviewed pt's medications, allergies, PMH, social hx, family hx, and changes were documented in the history of present illness. Otherwise, unchanged from my initial visit note.  Past Medical History:  Diagnosis Date  . Allergy   . Anemia   . Anxiety   .  Asthma   . Atrial fibrillation ([place])   . Bipolar 1 disorder ([place])   . 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 Cholelithiasis    a. 09/2016 s/p Lap Chole.  . Chronic bronchitis ([place])   . Chronic combined systolic and diastolic congestive heart failure ([place])    a. 09/2016 Echo: EF 30-35%.  . Colon polyps   . COPD (chronic obstructive pulmonary disease) ([place])   . Depression   . Family history of adverse reaction to anesthesia    Uncle was positive for malignant hyperthermia; patient had testing done and was  negative.  . Fibromyalgia   . GERD (gastroesophageal reflux disease)   . Gout   . High cholesterol   . History of blood transfusion 06/2015   "bleeding from my rectum"  . History of hiatal hernia   . HOH (hard of hearing)   . Hx of colonic polyps 03/21/2016   3 small adenomas no recall - co-morbidities  . Neuropathy    Disc Back   . NICM (nonischemic cardiomyopathy) ([place])    a. Previously worked up in [place], MD-->low EF with subsequent recovery.  Multiple caths (last ~ 2014 per pt report)--reportedly nl cors;  b. 09/2016 Echo: EF 30-35%, antsept/apical HK, mild MR, mildly dil LA, mod dil RA;  c. 09/2016 Lexi MV: EF 26%, glob HK, sept DK, med size, mod intensity fixed septal defect - BBB/PVC related artifact, no ischemia.  . On home oxygen therapy    "3L; 24/7" (03/14/2016)  . OSA treated with BiPAP    uses biPAP, 10 (03/14/2016)  . Osteoarthritis   . Oxygen deficiency   . Pneumonia   . Type II diabetes mellitus ([place])    Past Surgical History:  Procedure Laterality Date  . APPENDECTOMY     "they busted"  . bladder stimulator     pt states, "it cannot be turned off; it's in my right hip; dead battery so it's not working anymore". (03/14/2016)  . BLADDER SUSPENSION     2003, 2006 and 2010  . CATARACT EXTRACTION W/PHACO Right 11/29/2014   Procedure: CATARACT EXTRACTION PHACO AND INTRAOCULAR LENS PLACEMENT (IOC);  Surgeon: Rutherford Guys, MD;  Location: [facility];  Service: Ophthalmology;  Laterality: Right;  CDE:3.81  . CATARACT EXTRACTION W/PHACO Left 12/13/2014   Procedure: CATARACT EXTRACTION PHACO AND INTRAOCULAR LENS PLACEMENT (IOC);  Surgeon: Rutherford Guys, MD;  Location: [facility];  Service: Ophthalmology;  Laterality: Left;  CDE:6.59  . CERVICAL DISC SURGERY N/A 2009   4, 6, and 7 cervical disc replaced  . CHOLECYSTECTOMY N/A 09/13/2016   Procedure: LAPAROSCOPIC CHOLECYSTECTOMY;  Surgeon: Rolm Bookbinder, MD;  Location: [facility];  Service: General;  Laterality: N/A;  . COLONOSCOPY  WITH PROPOFOL N/A 03/15/2016   Procedure: COLONOSCOPY WITH PROPOFOL;  Surgeon: Gatha Mayer, MD;  Location: [facility];  Service: Endoscopy;  Laterality: N/A;  . ESOPHAGOGASTRODUODENOSCOPY (EGD) WITH PROPOFOL N/A 03/15/2016   Procedure: ESOPHAGOGASTRODUODENOSCOPY (EGD) WITH PROPOFOL;  Surgeon: Gatha Mayer, MD;  Location: [facility];  Service: Endoscopy;  Laterality: N/A;  . HEEL SPUR SURGERY Bilateral   . HERNIA REPAIR    . I&D EXTREMITY Right 06/13/2015   Procedure: MINOR IRRIGATION AND DEBRIDEMENT EXTREMITY REMOVAL OF NAIL;  Surgeon: Daryll Brod, MD;  Location: [facility];  Service: Orthopedics;  Laterality: Right;  . TUBAL LIGATION    . UMBILICAL HERNIA REPAIR     w/mesh   History   Social History  . Marital Status: Married  Spouse Name: N/A    Number of Children: 1   Occupational History  . disabled   Social History Main Topics  . Smoking status: Former Smoker -- 2.00 packs/day for 14 years    Types: Cigarettes    Quit date: 04/12/1986  . Smokeless tobacco: Not on file  . Alcohol Use: No  . Drug Use: No   Current Outpatient Prescriptions on File Prior to Visit  Medication Sig Dispense Refill  . albuterol (PROVENTIL) (2.5 MG/3ML) 0.083% nebulizer solution Take 3 mLs (2.5 mg total) by nebulization every 6 (six) hours as needed for wheezing or shortness of breath. 75 mL 5  . atorvastatin (LIPITOR) 40 MG tablet Take 1 tablet (40 mg total) by mouth daily. 90 tablet 3  . cetirizine (ZYRTEC) 10 MG tablet Take 10 mg by mouth at bedtime.    . cholecalciferol (VITAMIN D) 400 units TABS tablet Take 1 tablet (400 Units total) by mouth 2 (two) times daily. 180 each 3  . docusate sodium (COLACE) 100 MG capsule Take 100 mg by mouth daily.    Marland Kitchen escitalopram (LEXAPRO) 10 MG tablet Take 10 mg by mouth at bedtime.     . fenofibrate 54 MG tablet Take 1 tablet (54 mg total) by mouth daily. 90 tablet 3  . ferrous sulfate 324 (65 Fe) MG TBEC Take 1 tablet by mouth daily.     . furosemide (LASIX) 40 MG tablet Take 2 tablets (80 mg total) by mouth 2 (two) times daily. 360 tablet 3  . Insulin Glargine (BASAGLAR KWIKPEN) 100 UNIT/ML SOPN Inject 25 units into the skin daily (Patient taking differently: Inject 40 Units into the skin at bedtime. Inject 25 units into the skin daily) 25 pen 0  . insulin lispro (HUMALOG KWIKPEN) 100 UNIT/ML KiwkPen CBG 70-120: 0 units; 121-150: 1 U; 151-200: 2 U; 201-250: 3 U; 251-300: 5 U; 301-350: 7 U; 351-400: 9 U 35 pen 3  . ipratropium (ATROVENT) 0.02 % nebulizer solution Take 2.5 mLs (0.5 mg total) by nebulization every 6 (six) hours as needed for wheezing or shortness of breath. 75 mL 5  . metFORMIN (GLUCOPHAGE-XR) 500 MG 24 hr tablet Take 1 tablet (500 mg total) by mouth 2 (two) times daily. 180 tablet 2  . metolazone (ZAROXOLYN) 2.5 MG tablet Take 1 tablet (2.5 mg total) by mouth daily. 90 tablet 3  . metoprolol succinate (TOPROL-XL) 25 MG 24 hr tablet Take 1 tablet (25 mg total) by mouth 2 (two) times daily. 180 tablet 3  . mirtazapine (REMERON) 15 MG tablet Take 15 mg by mouth at bedtime.    . modafinil (PROVIGIL) 200 MG tablet Take 1 tablet (200 mg total) by mouth daily. 30 tablet 5  . mometasone (NASONEX) 50 MCG/ACT nasal spray Place 1 spray into the nose daily. 17 g 11  . montelukast (SINGULAIR) 10 MG tablet Take 1 tablet (10 mg total) by mouth at bedtime. 30 tablet 6  . nitroGLYCERIN (NITROSTAT) 0.4 MG SL tablet Place 1 tablet (0.4 mg total) under the tongue every 5 (five) minutes as needed for chest pain. 25 tablet 3  . nystatin (NYSTATIN) powder Apply topically 2 (two) times daily. Apply under breasts 15 g 3  . ondansetron (ZOFRAN-ODT) 4 MG disintegrating tablet Take 1 tablet (4 mg total) by mouth 2 (two) times daily as needed for nausea or vomiting. 30 tablet 0  . oxyCODONE-acetaminophen (PERCOCET/ROXICET) 5-325 MG tablet Take 1 tablet by mouth every 8 (eight) hours as needed for severe pain.    Marland Kitchen  OXYGEN Inhale 3 L into the  lungs continuous. 3 liters 24/7    . pantoprazole (PROTONIX) 20 MG tablet Take 1 tablet (20 mg total) by mouth 2 (two) times daily before a meal. 180 tablet 1  . PRESCRIPTION MEDICATION Inhale into the lungs See admin instructions. Use BIPAP every time laying down    . primidone (MYSOLINE) 50 MG tablet Take 1 tablet (50 mg total) by mouth at bedtime. 90 tablet 3  . Probiotic Product (PROBIOTIC PO) Take 1 tablet by mouth at bedtime.    Marland Kitchen QUEtiapine (SEROQUEL) 50 MG tablet Take 50 mg by mouth at bedtime.   0  . triamcinolone lotion (KENALOG) 0.1 % Apply 1 application topically 3 (three) times daily. For up to 14 days and as needed 60 mL 1  . vitamin A 10000 UNIT capsule Take 10,000 Units by mouth every morning.    . warfarin (COUMADIN) 4 MG tablet TAKE 1 TABLET BY MOUTH DAILY EXCEPT 1/2 TABLET ON TUESDAYS 30 tablet 1   No current facility-administered medications on file prior to visit.    Allergies  Allergen Reactions  . Xarelto [Rivaroxaban] Other (See Comments)    Internal bleeding  . Ancef [Cefazolin] Nausea And Vomiting  . Levaquin [Levofloxacin In D5w] Other (See Comments)    "afib"  . Lyrica [Pregabalin] Hives  . Tamiflu [Oseltamivir Phosphate] Other (See Comments)    "water blisters"  . Zoloft [Sertraline Hcl] Other (See Comments)    Jaw problems, jittery  . Augmentin [Amoxicillin-Pot Clavulanate] Itching  . Ciprofloxacin Itching and Nausea And Vomiting  . Haldol [Haloperidol] Other (See Comments)    Restless leg  . Nsaids Diarrhea  . Penicillins Itching, Nausea And Vomiting and Rash    Has patient had a PCN reaction causing immediate rash, facial/tongue/throat swelling, SOB or lightheadedness with hypotension: Yes Has patient had a PCN reaction causing severe rash involving mucus membranes or skin necrosis: No Has patient had a PCN reaction that required hospitalization No Has patient had a PCN reaction occurring within the last 10 years: Yes If all of the above answers are  "NO", then may proceed with Cephalosporin use.  . Topamax [Topiramate] Nausea Only   Family History  Problem Relation Age of Onset  . Heart disease Mother   . COPD Mother   . Diabetes Mother   . Breast cancer Mother   . Heart disease Father   . Hyperlipidemia Father   . COPD Sister   . Heart disease Sister   . Diabetes Sister   . Heart disease Maternal Grandmother    PE: BP 120/74 (BP Location: Left Arm, Patient Position: Sitting)   Pulse (!) 101   Wt 190 lb (86.2 kg)   SpO2 98% Comment: 3L of o2.  BMI 38.38 kg/m  Body mass index is 38.38 kg/m. Wt Readings from Last 3 Encounters:  01/27/17 190 lb (86.2 kg)  01/10/17 189 lb (85.7 kg)  01/08/17 188 lb 9.6 oz (85.5 kg)   Constitutional: obese, in wheelchair, in NAD Eyes: PERRLA, EOMI, no exophthalmos ENT: moist mucous membranes, no thyromegaly, no cervical lymphadenopathy Cardiovascular: tachycardia, RR, No MRG Respiratory: CTA B Gastrointestinal: abdomen soft, NT, ND, BS+ Musculoskeletal: no deformities, strength intact in all 4 Skin: moist, warm, no rashes Neurological: no tremor with outstretched hands, DTR normal in all 4 + foot exam performed today Diabetic Foot Exam - Simple   Simple Foot Form Diabetic Foot exam was performed with the following findings:  Yes 01/27/2017  1:29 PM  Visual Inspection See comments:  Yes Sensation Testing See comments:  Yes Pulse Check Posterior Tibialis and Dorsalis pulse intact bilaterally:  Yes Comments No sensation to monofilament on B toes. Dyshidrotic eczema B interdigital spaces. L midfoot lateral bone protrusion >> tender to palpation     ASSESSMENT: 1. DM2, insulin-dependent, uncontrolled, with complications - CKD stage 3 - DR - PN  2. HL   PLAN:  1. Patient with long-standing, Uncontrolled diabetes, with higher reported sugars at this visit, but with more hypoglycemic episodes that she perceives after meals. Due to the sensation of dropping her sugars (associated  with sweating and feeling poorly), we will need to decrease her insulin doses. Unfortunately, she again does not bring a log, so it's difficult to understand which doses need to be changed. Therefore, I will go ahead and change all of her doses. I strongly advised her to start writing sugars down and document the lows. - I advised her to:  Patient Instructions  Please decrease: - Metformin ER 500 mg 2x a day  Please decrease: - Basaglar 30 units in am and 30 units at bedtime - Humalog:  - 16 units before b'fast - 20-24 units before lunch and dinner  Please return in 3 months with your sugar log.   - today, HbA1c is 7.9% (slightly better) - continue checking sugars at different times of the day - check 3x a day, rotating checks - advised for yearly eye exams >> she is UTD - Return to clinic in 3 mo with sugar log   2. HL - reviewed latest lipid panel >> LDL much better on Crestor >> continue - refilled fenofibrate  Philemon Kingdom, MD PhD [facility] Endocrinology

## 2017-01-27 NOTE — Patient Instructions (Addendum)
Please decrease: - Metformin ER 500 mg 2x a day  Please decrease: - Basaglar 30 units in am and 30 units at bedtime - Humalog:  - 16 units before b'fast - 20-24 units before lunch and dinner  Please return in 3 months with your sugar log.

## 2017-01-27 NOTE — Addendum Note (Signed)
Addended by: Caprice Beaver T on: 01/27/2017 01:59 PM   Modules accepted: Orders

## 2017-01-27 NOTE — Addendum Note (Signed)
Addended by: Caprice Beaver T on: 01/27/2017 01:35 PM   Modules accepted: Orders

## 2017-01-29 ENCOUNTER — Other Ambulatory Visit: Payer: Self-pay

## 2017-01-29 ENCOUNTER — Other Ambulatory Visit: Payer: Self-pay | Admitting: Pharmacist

## 2017-01-29 ENCOUNTER — Ambulatory Visit (INDEPENDENT_AMBULATORY_CARE_PROVIDER_SITE_OTHER): Payer: Medicare Other | Admitting: *Deleted

## 2017-01-29 ENCOUNTER — Telehealth: Payer: Self-pay | Admitting: Internal Medicine

## 2017-01-29 DIAGNOSIS — I480 Paroxysmal atrial fibrillation: Secondary | ICD-10-CM | POA: Diagnosis not present

## 2017-01-29 DIAGNOSIS — I4891 Unspecified atrial fibrillation: Secondary | ICD-10-CM

## 2017-01-29 LAB — POCT INR: INR: 2.3

## 2017-01-29 MED ORDER — WARFARIN SODIUM 4 MG PO TABS: ORAL_TABLET | ORAL | 1 refills | Status: DC

## 2017-01-29 MED ORDER — BASAGLAR KWIKPEN 100 UNIT/ML ~~LOC~~ SOPN: 30.0000 [IU] | PEN_INJECTOR | Freq: Two times a day (BID) | SUBCUTANEOUS | 5 refills | Status: DC

## 2017-01-29 MED ORDER — INSULIN LISPRO 100 UNIT/ML (KWIKPEN): PEN_INJECTOR | SUBCUTANEOUS | 3 refills | Status: DC

## 2017-01-29 NOTE — Telephone Encounter (Signed)
S/w pt she is verbally SOB ans has been for a week. Wt is up 6#. Pt is taking lasix 80mg  BID and metolazone 2.5mg  daily, will defer to [facility].

## 2017-01-29 NOTE — Telephone Encounter (Signed)
Reprint Basaglar and Humalog scripts and fax to [phone] so Denyse Amass with [facility] so she can send it to Patient  Assistance Program.  Thank you,  -LL

## 2017-01-29 NOTE — Telephone Encounter (Signed)
S/w Denise Freeman-RPH she states to increase lasix take 40mg  now and normal dose 80mg  in the pm, no changes in metolazone. Then tomorrow 80mg  and 40mg  midday and 80mg  pm. If SOB is worse this afternoon or tomorrow go to ER. Continue daily weights.  Pt notified verbalized understanding of directions and to go to ER if SOB worsens. She will call back Friday with results.

## 2017-01-29 NOTE — Telephone Encounter (Signed)
Printed the [employer], will fax.

## 2017-01-29 NOTE — Telephone Encounter (Signed)
New message  Pt call requesting to speak with RN. Pt states she feel that she is retaining fluids around her heart. She states it has been going on for about a week. Please call back to discuss

## 2017-01-29 NOTE — Patient Outreach (Signed)
[facility]) Care Management  01/29/2017  Bianca Raneri [facility] 11-30-1950 [id number]   [place] Patient Assistance Program was called on the patient's behalf.  HIPAA identifiers were obtained.  The [employer] representative reported Nancee Liter had been approved until 06/02/17 but the Humalog prescription needed clarification.  Dr. Chrissie Noa office was called and a message was left for her CMA, Caprice Beaver.  Copies of the prescriptions written on 01/28/17.  Patient was called. HIPAA identifiers were obtained.  Patient reported her insulin regimen was changed to:  Basaglar 100u/ml 30 units twice daily Humalog 16 units before breakfast, 20 before lunch and 24 before dinner.  In addition to the phone message left at Dr. Chrissie Noa office, Caprice Beaver was sent an inbasket message.  Patient reported her weight as 185 pounds but she is not sure because she said she was "jittery" this morning.  She said she usually weighs 180-181 pounds. At her physician visit on 01/27/17, patient weighed 190 pounds.  Dr. Lysbeth Penner office increased her furosemide dose to 40mg  in the am and 80mg  in the pm today and then 80mg  in the am and 40mg  midday tomorrow.  While speaking with the patient, I asked her to reweigh. She did and reported her weight as 190 pounds. (This weight was taken after eating breakfast).  Dr. Lysbeth Penner office will follow up with the patient about her fluid retention and weight increase by Friday January 31, 2017.     Plan:  Follow up in 3-5 business days    [name], PharmD, [place] Clinical Pharmacist [phone]

## 2017-01-30 ENCOUNTER — Telehealth: Payer: Self-pay | Admitting: Internal Medicine

## 2017-01-30 ENCOUNTER — Telehealth: Payer: Self-pay

## 2017-01-30 MED ORDER — CETIRIZINE HCL 10 MG PO TABS: 10.0000 mg | ORAL_TABLET | Freq: Every day | ORAL | 11 refills | Status: DC

## 2017-01-30 NOTE — Telephone Encounter (Signed)
Pt is calling because she needs a refill on her Zyrtec. The pharmacy she uses does a pill pack and she is due for her pill pack and they are missing a refill on her Zyrtec. They can not finish her pill pack until they receive this. She has 5 days to go and that need this right away. jw

## 2017-01-30 NOTE — Telephone Encounter (Signed)
Placed order for zyrtec as requested to [facility]. Authorized 11 refills.

## 2017-01-30 NOTE — Telephone Encounter (Signed)
I contacted the patient and advised via voicemail her patient assistance for the basaglar is ready for pick up. Patient advised to call back if she had any further questions.

## 2017-01-31 ENCOUNTER — Telehealth: Payer: Self-pay | Admitting: Pharmacist

## 2017-01-31 MED ORDER — LOSARTAN POTASSIUM 25 MG PO TABS
25.0000 mg | ORAL_TABLET | Freq: Every day | ORAL | 1 refills | Status: DC
Start: 2017-01-31 — End: 2017-03-12

## 2017-01-31 NOTE — Telephone Encounter (Signed)
BP up since last office visit. Patient reports reading of 150/110 this morning.   Patient instructed to resume losartan 25mg  daily as previously prescribed and follow up with DR Debara Pickett on October/10 as scheduled.  Rx sent to prefer pharmacy

## 2017-01-31 NOTE — Telephone Encounter (Signed)
-----   Message from [place], [facility] sent at 01/08/2017  7:39 PM EDT ----- Regarding: Blood pressure Phone f/u in 2 weeks, then schedule clinic f/u in 6 weeks

## 2017-02-05 ENCOUNTER — Other Ambulatory Visit: Payer: Self-pay | Admitting: Pharmacist

## 2017-02-05 NOTE — Patient Outreach (Signed)
[facility]) Care Management  02/05/2017  Denise Freeman [facility] 20-Oct-1950 [id number]   [place] Patient Assistance Program was called on the patient's behalf.  The representative said the patient has been approved for a 90 day supply of both Basaglar and Humalog.   Basaglar was already delivered to the provider's office and the Humalog was ordered 02/04/17 and should arrive in 7-10 business days.  A HIPAA compliant message was left on the patient's voicemail  Plan:  Follow up with the patient in 7-10 days to be sure she picked up her delivery.  Route note to provider.  [name], PharmD, [place] Clinical Pharmacist [phone]

## 2017-02-06 ENCOUNTER — Telehealth: Payer: Self-pay

## 2017-02-06 NOTE — Telephone Encounter (Signed)
Patient notified Humalog is ready for pick up from patient assistance and had no further questions a this time.

## 2017-02-10 ENCOUNTER — Encounter: Payer: Self-pay | Admitting: Internal Medicine

## 2017-02-10 NOTE — Progress Notes (Signed)
Received sugar log from pt - since last visit: - am: 197-230 - lunch: 164-216, 240 - dinner: 196-259  Currently, patient is on: - Metformin 500 mg twice a day - Basaglar 30 units twice a day  - Humalog: 16 units before breakfast   20-24 units before lunch and dinner  We will change her regimen to: - Metformin 500 mg twice a day - Basaglar 30 units in a.m. and 38 units at bedtime - Humalog: 16 units before breakfast   26-30 units before lunch and dinner

## 2017-02-11 ENCOUNTER — Telehealth: Payer: Self-pay

## 2017-02-11 NOTE — Telephone Encounter (Signed)
Patient returning missed phone call. Please call patient and advise.

## 2017-02-11 NOTE — Telephone Encounter (Signed)
Called and LVM advised patient to call back to discuss changes.

## 2017-02-11 NOTE — Progress Notes (Signed)
Called and LVM advised patient to call back to discuss changes.

## 2017-02-12 ENCOUNTER — Ambulatory Visit: Payer: Medicare Other

## 2017-02-12 ENCOUNTER — Other Ambulatory Visit: Payer: Medicare Other

## 2017-02-12 ENCOUNTER — Telehealth: Payer: Self-pay

## 2017-02-12 NOTE — Telephone Encounter (Signed)
Patient called in regarding her insulin changes, I went over that with her no questions at this time. Also patient asking about her diabetic shoes. Waiting on the paperwork for MD to fill out that patient dropped off so I can send it to [facility] in [place].

## 2017-02-13 ENCOUNTER — Telehealth: Payer: Self-pay

## 2017-02-13 NOTE — Telephone Encounter (Signed)
We have placed the insulin in storage for now.
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
## 2017-02-13 NOTE — Telephone Encounter (Signed)
Patient will come for insulin next week Tuesday 09/18.  She is aware the insulin is now in the storage area.  Ty,  -LL

## 2017-02-17 ENCOUNTER — Other Ambulatory Visit: Payer: Self-pay | Admitting: *Deleted

## 2017-02-17 ENCOUNTER — Ambulatory Visit: Payer: Self-pay | Admitting: *Deleted

## 2017-02-17 ENCOUNTER — Telehealth: Payer: Self-pay | Admitting: Internal Medicine

## 2017-02-17 NOTE — Telephone Encounter (Signed)
Denise Freeman returning phone call to [place]. Please call and advise.

## 2017-02-17 NOTE — Telephone Encounter (Signed)
Yes, I filled this out, I believe

## 2017-02-17 NOTE — Telephone Encounter (Signed)
Called back [place] at [facility] to discuss what else to do to go forward.  Left call back number.
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
## 2017-02-17 NOTE — Telephone Encounter (Signed)
Denise Freeman with [facility] calling in reference to patient needing diabetic shoes. THN called to get clarification on the process and what the patient needs to do. Please call and advise.

## 2017-02-17 NOTE — Telephone Encounter (Signed)
Please advise, have you seen her paperwork, she said that she dropped it off the week I was gone, and I have no seen it since being back?

## 2017-02-18 DIAGNOSIS — M5136 Other intervertebral disc degeneration, lumbar region: Secondary | ICD-10-CM | POA: Diagnosis not present

## 2017-02-18 DIAGNOSIS — G894 Chronic pain syndrome: Secondary | ICD-10-CM | POA: Diagnosis not present

## 2017-02-18 NOTE — Telephone Encounter (Signed)
Called and spoke with Joelene Millin regarding the diabetic shoes. She is going by the shoe place in [place] to get a form to fill out, and then coming here to pick up patients medications, and then going by to see the patient. I advised if there was anything else we could do until then to let me know.

## 2017-02-18 NOTE — Telephone Encounter (Signed)
Denise Freeman called in reference to the [place] name she was given no longer doing diabetic shoes. Denise Freeman is going to try and find out what company patient is using and go by that company to get info. Call [place] with any questions.

## 2017-02-19 ENCOUNTER — Telehealth: Payer: Self-pay | Admitting: *Deleted

## 2017-02-19 ENCOUNTER — Other Ambulatory Visit: Payer: Self-pay | Admitting: Pharmacist

## 2017-02-19 ENCOUNTER — Telehealth: Payer: Self-pay

## 2017-02-19 NOTE — Patient Outreach (Signed)
[facility]) Care Management  02/19/2017  Denise Freeman [facility] 1950/07/09 [id number]   Called patient to follow up on medication assistance.  HIPAA identifiers were obtained.  Patient Confirmed she received her insulin (Basaglar and Humalog) from her provider's office.    Plan:  Follow up with patient in November to investigate reordering.   [name], PharmD, [place] Clinical Pharmacist [phone]

## 2017-02-19 NOTE — Telephone Encounter (Signed)
Called patient to verify she had picked up her medication, which she did. Spoke with patient [facility] nurse who is getting the paperwork needed for her diabetic shoes.

## 2017-02-21 ENCOUNTER — Other Ambulatory Visit: Payer: Self-pay | Admitting: *Deleted

## 2017-02-21 ENCOUNTER — Telehealth: Payer: Self-pay | Admitting: *Deleted

## 2017-02-21 NOTE — Telephone Encounter (Signed)
Joelene Millin, RN with [facility] left message on nurse line requesting a referral placed to endocrinologist. Pt is currently having some issue with current endo provider. Please give patient a call; number on file is correct.  Derl Barrow, RN

## 2017-02-21 NOTE — Patient Outreach (Signed)
[facility]) Care Management   02/21/2017  K[facility]ston [facility] Jan 14, 1951 [id number]  Denise Freeman is an 66 y.o. female  Subjective:   Objective:   BP 128/70   Pulse 80   Temp 98.1 F (36.7 C) (Oral)   Resp 20   Ht 1.499 m (_0 )   Wt 180 lb (81.6 kg)   SpO2 98%   BMI 36.36 kg/m  Review of Systems  HENT: Negative.   Eyes: Negative.   Respiratory: Negative.   Cardiovascular: Positive for leg swelling.  Gastrointestinal: Negative.   Genitourinary: Negative.   Musculoskeletal: Positive for joint pain.       Right and left hip pain with a developing nodules Left foot and right bone pain   Skin: Negative.   Neurological: Positive for weakness. Negative for dizziness, tingling, tremors, sensory change, speech change, focal weakness, seizures, loss of consciousness and headaches.  Endo/Heme/Allergies: Bruises/bleeds easily.  Psychiatric/Behavioral: Positive for depression. The patient is nervous/anxious.        Pt tearful about her hip and feet pain related to not being able to get her diabetic shoes     Physical Exam  Constitutional: She is oriented to person, place, and time. She appears well-developed and well-nourished.  HENT:  Head: Normocephalic and atraumatic.  Eyes: Pupils are equal, round, and reactive to light.  Neck: Normal range of motion. Neck supple.  Cardiovascular: Normal rate and regular rhythm.   Respiratory: Effort normal and breath sounds normal.  GI: Soft. Bowel sounds are normal.  Musculoskeletal: She exhibits edema and tenderness.  Neurological: She is alert and oriented to person, place, and time.  Skin: Skin is warm and dry.  Psychiatric: Judgment and thought content normal.    Encounter Medications:   Outpatient Encounter Prescriptions as of 02/21/2017  Medication Sig  . albuterol (PROVENTIL) (2.5 MG/3ML) 0.083% nebulizer solution Take 3 mLs (2.5 mg total) by nebulization every 6 (six) hours as needed for wheezing  or shortness of breath.  Marland Kitchen atorvastatin (LIPITOR) 40 MG tablet Take 1 tablet (40 mg total) by mouth daily.  . cetirizine (ZYRTEC) 10 MG tablet Take 1 tablet (10 mg total) by mouth at bedtime.  . cholecalciferol (VITAMIN D) 400 units TABS tablet Take 1 tablet (400 Units total) by mouth 2 (two) times daily.  Marland Kitchen docusate sodium (COLACE) 100 MG capsule Take 100 mg by mouth daily.  Marland Kitchen escitalopram (LEXAPRO) 10 MG tablet Take 10 mg by mouth at bedtime.   . fenofibrate 54 MG tablet Take 1 tablet (54 mg total) by mouth daily.  . ferrous sulfate 324 (65 Fe) MG TBEC Take 1 tablet by mouth daily.  . furosemide (LASIX) 40 MG tablet Take 2 tablets (80 mg total) by mouth 2 (two) times daily.  . Insulin Glargine (BASAGLAR KWIKPEN) 100 UNIT/ML SOPN Inject 0.3 mLs (30 Units total) into the skin 2 (two) times daily. Inject 25 units into the skin daily  . insulin lispro (HUMALOG KWIKPEN) 100 UNIT/ML KiwkPen Inject 16 units at breakfast, 20-24 units at lunch and dinner. Max dose daily- 64 units.  . Insulin Pen Needle 32G X 4 MM MISC Use to inject insulin 5 times daily  . ipratropium (ATROVENT) 0.02 % nebulizer solution Take 2.5 mLs (0.5 mg total) by nebulization every 6 (six) hours as needed for wheezing or shortness of breath.  . losartan (COZAAR) 25 MG tablet Take 1 tablet (25 mg total) by mouth daily.  . metFORMIN (GLUCOPHAGE-XR) 500 MG 24 hr tablet Take 1  tablet (500 mg total) by mouth 2 (two) times daily.  . metolazone (ZAROXOLYN) 2.5 MG tablet Take 1 tablet (2.5 mg total) by mouth daily.  . metoprolol succinate (TOPROL-XL) 25 MG 24 hr tablet Take 1 tablet (25 mg total) by mouth 2 (two) times daily.  . mirtazapine (REMERON) 15 MG tablet Take 15 mg by mouth at bedtime.  . modafinil (PROVIGIL) 200 MG tablet Take 1 tablet (200 mg total) by mouth daily.  . mometasone (NASONEX) 50 MCG/ACT nasal spray Place 1 spray into the nose daily.  . montelukast (SINGULAIR) 10 MG tablet Take 1 tablet (10 mg total) by mouth at  bedtime.  . nitroGLYCERIN (NITROSTAT) 0.4 MG SL tablet Place 1 tablet (0.4 mg total) under the tongue every 5 (five) minutes as needed for chest pain.  Marland Kitchen nystatin (NYSTATIN) powder Apply topically 2 (two) times daily. Apply under breasts  . ondansetron (ZOFRAN-ODT) 4 MG disintegrating tablet Take 1 tablet (4 mg total) by mouth 2 (two) times daily as needed for nausea or vomiting.  Marland Kitchen oxyCODONE-acetaminophen (PERCOCET/ROXICET) 5-325 MG tablet Take 1 tablet by mouth every 8 (eight) hours as needed for severe pain.  . OXYGEN Inhale 3 L into the lungs continuous. 3 liters 24/7  . pantoprazole (PROTONIX) 20 MG tablet Take 1 tablet (20 mg total) by mouth 2 (two) times daily before a meal.  . PRESCRIPTION MEDICATION Inhale into the lungs See admin instructions. Use BIPAP every time laying down  . primidone (MYSOLINE) 50 MG tablet Take 1 tablet (50 mg total) by mouth at bedtime.  . Probiotic Product (PROBIOTIC PO) Take 1 tablet by mouth at bedtime.  Marland Kitchen QUEtiapine (SEROQUEL) 50 MG tablet Take 50 mg by mouth at bedtime.   . triamcinolone lotion (KENALOG) 0.1 % Apply 1 application topically 3 (three) times daily. For up to 14 days and as needed  . vitamin A 10000 UNIT capsule Take 10,000 Units by mouth every morning.  . warfarin (COUMADIN) 4 MG tablet TAKE 1 TABLET BY MOUTH DAILY EXCEPT 1/2 TABLET ON TUESDAYS   No facility-administered encounter medications on file as of 02/21/2017.     Functional Status:   In your present state of health, do you have any difficulty performing the following activities: 10/16/2016 09/23/2016  Hearing? Y Y  Comment has a hearing aide, but doesn't always wear them -  Vision? [name]  Comment wears glasses, but is legally blind -  Difficulty concentrating or making decisions? Y N  Walking or climbing stairs? Y Y  Dressing or bathing? N N  Doing errands, shopping? N Y  Some recent data might be hidden    Fall/Depression Screening:    Fall Risk  01/03/2017 12/12/2016 11/26/2016   Falls in the past year? Yes Yes No  Number falls in past yr: 2 or more - -  Injury with Fall? No Yes -  Risk Factor Category  - - -  Comment - - -  Risk for fall due to : Other (Comment);Medication side effect Other (Comment);Impaired vision -  Risk for fall due to: Comment hypoglycemia patient fell out of her bed -  Follow up - - -   PHQ 2/9 Scores 12/12/2016 12/05/2016 11/26/2016 11/04/2016 10/16/2016 09/06/2016 07/18/2016  PHQ - 2 Score 2 0 0 0 2 0 0  PHQ- 9 Score 5 - - - 9 - -    Assessment:   Today Mrs Mazur is talking fast and at a continuously Pt reports she "turned her walker on top of  me yesterday" (she is referring to her rollator) Reports she has had to use her scooter and is having trouble lying in her bed  "it's torn my mind and self esteem down" related to frequent calls to endocrinologist office to get assistance with DM shoes Mrs Guercio was tearful in conversation about her multiple attempts to get assist with diabetic shoes and behavioral health Mrs Towle is having spasms during the home visit and noted with sudden grimaces when sitting in her kitchen chair and stiffening of her body plus grimaces and moaning when walking CHF her weight had increased to 187 lbs when there were difficulty with her medications from her pharmacy that is now corrected  Mrs Cass is trying to find someone to purchase her scooter   Reviewed all Mrs Shenk's appointments with her 03/12/17 1115 dr hilty   03/13/17 foot dr 1330 03/18/17 breast center mammograms and bone density  1010/1100  03/21/17 back to dr Patrice Paradise "pain doctor" 03/21/17    Plan:  [facility] CM reviewed, assisted in filling out [facility], [facility] consent for release of client information form after calling the [facility] care office to clarify with staff the correct information to enter [facility] CM reviewed with Mrs Tecson that [facility] CM has obtained several  statement of certifying physician applications and has been to her pcp, gherghe,  on 02/19/17 to advocate for her related to diabetic shoes and to check for insulin that staff confirmed she had picked up on 02/18/17 and 02/20/17 to give the office staff a copy of the statement of certifying physician application to be completed by Dr Cruzita Lederer and faxed to [facility], [place] THN CM called to dt Dr Dorris Fetch office after confirming he is listed as an in Sales promotion account executive provider but office closed at 1200 noon [facility] CM called and left a voice message for pcp triage nurse  [facility] CM spoke with pcp staff to confirm [facility] CM left message at correct extension Added Dr Rennis Harding "pain doctor" in [facility] as scoliosis provider Pt has seen this provider office on January 14 2017 Mrs Witcher states she is to be getting pain patches or pain cream if [facility] insurance takes it   Dr Patrice Paradise is not listed in EPIC   [facility] CM Care Plan Problem One     Most Recent Value  Care Plan Problem One  CHF home management deficit   Role Documenting the Problem One  Care Management Coordinator  Care Plan for Problem One  Active  [facility] Long Term Goal   over the next 45 days the patient will be able to verbalize home care management interventions for CHF  [facility] Long Term Goal Start Date  09/18/16  [facility] Long Term Goal Met Date  11/07/16  Interventions for Problem One Long Term Goal  Review CHF diagnosis, CHF zones, medications, diet and activity  THN CM Short Term Goal #1   over the next 7 days the patietn will receive her portable oxygen and tubing from [facility]  [facility] CM Short Term Goal #1 Start Date  09/18/16  [facility] CM Short Term Goal #1 Met Date  09/23/16  Interventions for Short Term Goal #1  contact APS to request service for DME  [facility] CM Short Term Goal #2   Over the next 7-14 days the patient will have decreased CHF symptoms by evidence of decreased lower extremity edema and decreased soc  [facility]N CM Short Term Goal #2 Start Date  09/18/16  [facility]N CM Short Term Goal #2 Met Date  09/23/16  Interventions for  Short Term Goal #2  Assessment of CHF symptoms, call to CV office to report abnormal symptoms and request medcation treatment and hospital folllow up appointments, monitor and evaluation treatment plan   THN CM Short Term Goal #3  over the next 30 days the patient will be seen by [facility] pharmacy staff to review pharmacy discrepancies and offer resolutions  [facility] CM Short Term Goal #3 Start Date  09/23/16  [facility] CM Short Term Goal #3 Met Date  10/25/16  Interventions for Short Tern Goal #3  referral to [place], review of medications, offer new pill box, monitor and evaluation pharmacy interventions      Jermichael Belmares L. Lavina Hamman, RN, BSN, [place] Care Management [phone]

## 2017-02-21 NOTE — Telephone Encounter (Signed)
Called patient re: referral. She would like to have a new endocrinologist because of issues getting documentation for diabetic shoes. She would prefer to see someone in [place]. Will place referral. Told her if appointment is too far out with new doctor could have appointment here with foot exam to complete forms.  [name], MD [place], PGY-3

## 2017-02-26 ENCOUNTER — Ambulatory Visit (INDEPENDENT_AMBULATORY_CARE_PROVIDER_SITE_OTHER): Payer: Medicare Other | Admitting: *Deleted

## 2017-02-26 ENCOUNTER — Other Ambulatory Visit: Payer: Self-pay | Admitting: *Deleted

## 2017-02-26 DIAGNOSIS — I4891 Unspecified atrial fibrillation: Secondary | ICD-10-CM

## 2017-02-26 DIAGNOSIS — I480 Paroxysmal atrial fibrillation: Secondary | ICD-10-CM | POA: Diagnosis not present

## 2017-02-26 DIAGNOSIS — Z5181 Encounter for therapeutic drug level monitoring: Secondary | ICD-10-CM | POA: Diagnosis not present

## 2017-02-26 LAB — POCT INR: INR: 2.6

## 2017-02-26 MED ORDER — CHOLECALCIFEROL 10 MCG (400 UNIT) PO TABS: 400.0000 [IU] | ORAL_TABLET | Freq: Two times a day (BID) | ORAL | 3 refills | Status: DC

## 2017-02-28 ENCOUNTER — Other Ambulatory Visit: Payer: Self-pay | Admitting: *Deleted

## 2017-02-28 NOTE — Patient Outreach (Signed)
[place] [facility]) Care Management  02/28/2017  Zniyah Midkiff [facility] Apr 20, 1951 [id number]   Care coordination   [facility] CM called and left Mrs Milazzo a message but also stopped by to fnd her at home  [facility] CM had completed forms for her on 02/21/17  [facility] CM stopped by and provided her with copy of her forms and updated her on visit to [facility] office and endocrinology offices She has not heard from them but states her back and feet continue to hurt No old diabetic shoes on today She is cleaning her home with oxygen intact  Plans to see Mrs Milian in 4 weeks and to continue to follow up on progress of her DM shoes and [place] referral   [place]. Lavina Hamman, RN, BSN, [place] Care Management [phone]

## 2017-03-01 NOTE — Patient Outreach (Signed)
[facility]) Care Management  02/19/2017  Kaileen Bronkema [facility] [birth year] [id number]   Care coordination    THN CM spoke with Mrs Kerry Dory, [facility] staff, [facility] staff and [place] staff to coordinate care related to her DM shoes and BH release of medical records  Plans continue to coordinate care for Mrs [facility] services and DM shoes as needed    [place]. Lavina Hamman, RN, BSN, [place] Care Management [phone]

## 2017-03-05 ENCOUNTER — Other Ambulatory Visit: Payer: Self-pay | Admitting: *Deleted

## 2017-03-05 NOTE — Patient Outreach (Signed)
[facility]) Care Management  03/05/2017  Becky Colan [facility] 1950-07-19 [id number]   Care coordination  Mrs Splitt left [facility] CM a voice message during a visit with another patient at 1o43 but then called again 1136  She called THN CM to report she was frustrated with a fast increased tone of voice to make [facility] CM aware that she had called the [facility] health office to make an appointment and was informed by the receptionist, Ruby that she had received a fax from [place] as a one page letter stating she had not received services in 2018 from [place].  Mrs Cina also shared she received a call from the DM [place], from [place] "My bone will never be the same. Can you help me get a new endocrinologist?" [facility] CM spoke with Ruby to confirm that Mrs Stoneking had not received services from [place].  [facility] is one of the offices that Dr Bernita Raisin works in in [place].  Confirms Mrs Haughton was seen at [facility] care at [place] office not [facility].  Ruby updated that on 9/21/8 [facility] CM obtained a medical release form from [place], assisted Mrs Siems with filling it out and returned it within the same day.  Ruby confirms that [facility] has NOT sent Mrs Heiser records to [place] behavior health at this time.  Ruby and THN CM agreed that another visit to the [place] office is needed to prevent Mrs Hausler behavior from escalating. [facility] CM stopped to [facility] for the third time [facility] CM spoke with Minette Brine, receptionist who states that [place] generally assists with completing medical release information.  THN CM discussed with her the importance of getting Mrs Christoph records to [place] as soon as possible to prevent Mrs Crays from having a crisis and running out of her monthly medication.  Mrs Janvier confirmed Dr Rosine Door did provide her with a 3 month supply of medication Rx after his staff informed her she could no longer be seen at [place] called [facility] CM later in the day at 1312 to again confirm the fax number for [facility] to send medical records.  THN CM dialed [facility] [phone] while having Olivia on speaker phone so she could hear the fax number as [phone]  Plans [facility] CM sent an in basket message to Dr Ola Spurr via [facility] requesting assistance with a referral to Dr Dorris Fetch office after checking medicare.gov to find Dr Dorris Fetch in network for medicare patients in [place] code [address] Mrs Lucia will be seen for a home visit in the next 4-6 weeks THN CM will continue to assist with new behavioral health services and a new endocrinologist and updated Mrs Kever.  Arionna Hoggard L. Lavina Hamman, RN, BSN, [place] Care Management [phone]

## 2017-03-06 ENCOUNTER — Other Ambulatory Visit: Payer: Self-pay | Admitting: Internal Medicine

## 2017-03-06 ENCOUNTER — Telehealth (HOSPITAL_COMMUNITY): Payer: Self-pay | Admitting: *Deleted

## 2017-03-06 DIAGNOSIS — Z794 Long term (current) use of insulin: Secondary | ICD-10-CM

## 2017-03-06 DIAGNOSIS — E118 Type 2 diabetes mellitus with unspecified complications: Secondary | ICD-10-CM

## 2017-03-06 NOTE — Telephone Encounter (Signed)
left voice message regarding appointment. 

## 2017-03-12 ENCOUNTER — Encounter: Payer: Self-pay | Admitting: Internal Medicine

## 2017-03-12 ENCOUNTER — Ambulatory Visit (INDEPENDENT_AMBULATORY_CARE_PROVIDER_SITE_OTHER): Payer: Medicare Other | Admitting: Internal Medicine

## 2017-03-12 VITALS — BP 112/56 | HR 77 | Ht 59.0 in | Wt 197.0 lb

## 2017-03-12 DIAGNOSIS — I5032 Chronic diastolic (congestive) heart failure: Secondary | ICD-10-CM | POA: Diagnosis not present

## 2017-03-12 DIAGNOSIS — I4891 Unspecified atrial fibrillation: Secondary | ICD-10-CM | POA: Diagnosis not present

## 2017-03-12 DIAGNOSIS — N183 Chronic kidney disease, stage 3 unspecified: Secondary | ICD-10-CM

## 2017-03-12 DIAGNOSIS — I428 Other cardiomyopathies: Secondary | ICD-10-CM

## 2017-03-12 MED ORDER — LOSARTAN POTASSIUM 25 MG PO TABS
25.0000 mg | ORAL_TABLET | Freq: Every day | ORAL | 5 refills | Status: DC
Start: 2017-03-12 — End: 2017-06-13

## 2017-03-12 NOTE — Progress Notes (Signed)
OFFICE NOTE  Chief Complaint:  Follow-up edema  Primary Care Physician: Rogue Bussing, MD  HPI:  Denise Freeman is a pleasant 66 year old female who is establishing cardiac care today. She is accompanied by her husband and they recently moved here from [place], [place]. She has a history of severe COPD on home oxygen. She also has a history of stress-induced cardiomyopathy in the past. She has since had recovery of her EF. She's had numerous cardiac catheterizations. None of which showed obstructive coronary disease. Her last cardiac catheterization was in October 2014 which demonstrated no significant coronary disease. This was after a small abnormality was noted in the apex suggestive of ischemia on a nuclear stress test. She does have a history of permanent atrial fibrillation on Coumadin. She will need to have her INRs followed here. Unfortunate she has not had her INR checked in over 9 weeks and it was assessed today and was low. We will need to adjust her medication. She will be established in our anticoagulation clinic. Blood pressure looks well controlled today. She is on cholesterol medication.  I saw Denise Freeman back today in the office. Unfortunate she was hospitalized in December for worsening shortness of breath and probable pneumonia with a mild diastolic heart failure exacerbation. She was given IV diuretics and her Lasix was increased to 40 mg 3 times a day. She's also taking metolazone 3 times weekly. She continues to complain of leg edema which is mostly dependent. She has significant shortness of breath and CO2 retention and an appointment with a pulmonologist is pending.  Denise Freeman returned today back in the office. Her main complaints are a number of excoriations and weeping lesions on her face and arms. She apparently is going to see a dermatologist about this. She's also had worsening leg swelling and shortness of breath. Her weight is now up about 8 pounds since  her last visit. She was instructed to take Lasix 40 mg 3 times a day but apparently she is taking it twice a day. She is taking the metolazone 3 times a week. She is wearing compression stockings which I had advised and seems that this helps her swelling.  Denise Freeman returns today and reports a big improvement in her leg swelling. She's currently on Lasix 60 mg twice daily and metolazone 3x weekly. Weight is come down and swelling is improved. Her BNP was over 250 and is come down to about 125.  I saw Denise Freeman back today in follow-up. She's been successful losing over 15 pounds which I congratulated her on. Her breathing continues to be fairly difficult. She is using BiPAP at night. She's been in the ER a few times for mostly difficulty breathing and COPD exacerbation. She tells me the other day she was in church and had an episode of sharp chest pain in her left anterior chest and left scapular area. She was quite upset and emotional today time. Recently she's been the target of possibly a scam, so that she is under a lot of stress. She was upset that she did not have anything to take for her chest discomfort. I tried to reassure her that her coronary arteries have look normal with multiple catheterizations. She does have some diastolic dysfunction I suspect her LVEDP goes up when she gets upset causing her chest discomfort.  10/31/2015  Denise Freeman was seen today in follow-up from her recent hospitalization. She was seen in January for acute GI bleeding thought to  be upper GI bleeding and required transfusion for hemoglobin of 5. She was evaluated by Dr. Silvano Rusk with [facility] who recommended an EGD. Unfortunately, it looks like she was discharged the next day, possibly [place] after she removed her IV and stated she needed to go home. She relates that she never saw the specialist and does not recall seeing Dr. Carlean Purl. Since that time she's continued to have blood in her  stool. She was taken off of Xarelto and not restarted on anticoagulation. She does have a high CHADSVASC score of 5, suggesting a higher risk of stroke.  08/07/2016  Denise Freeman returns for follow-up. She underwent GI work-up with no clear bleeding source. She does not want to be on anticoagulation for fear of another stroke. She attributes her vision loss and word-finding difficulty to her prior stroke on Xarelto. She was previously on warfarin in the distant past, but reported having difficulty maintaining a therapeutic INR. We discussed the possibility of a Watchman device today - she seems interested in this. I explained the procedure and will refer her to Dr. Rayann Heman for evaluation.  5/30/20181  Denise Freeman was seen today in follow-up. She recently saw Ignacia Bayley, NP, after hospital follow-up. Given her recurrent  cardiomyopathy with EF 30-35%, he recommended starting her on BiDil. This is based on recent lab work demonstrating significant renal insufficiency however repeat lab work shows normal creatinine. She recently has had 10 pound weight gain and feels that it is related to a decrease in her metolazone to a half tablet daily. Previously had had her on 80 mg Lasix twice a day and metolazone 2.5 mg daily.  12/09/2016  Denise Freeman returns today for follow-up. She reports some improvement in her fatigue and breathing. I placed her on both metolazone as well as a losartan. Today her blood pressures noted to be low at 90/50 and she is down about 6 pounds with a diuretic. Recently lab work indicated a small increase in her creatinine up to 1.35 with a baseline of about 1.0-1.1. She says a diuretic is doing good job of keeping her swelling off, however I wonder she may be a little bit over diuresis. In addition she may not be tolerating the ARB.  03/12/2017  Denise Freeman was seen today in follow-up. Her weight is now up about 13 pounds. She denies any worsening swelling. She thinks that it may be due to  eating her large meals at night. Her blood sugars have not been well controlled and apparently there is been some therapeutic disagreements between her an endocrinologist, she is been referred to another endocrinologist in [place]. She remains in permanent A. fib which is rate controlled. She's currently taking metolazone daily in addition to Lasix 80 mg twice a day. She denies any worsening shortness of breath or chest pain. She remains on home oxygen for COPD which is severe.  PMHx:  Past Medical History:  Diagnosis Date  . Allergy   . Anemia   . Anxiety   . Asthma   . Atrial fibrillation ([place])   . Bipolar 1 disorder ([place])   . 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 Cholelithiasis    a. 09/2016 s/p Lap Chole.  . Chronic bronchitis ([place])   . Chronic combined systolic and diastolic congestive heart failure ([place])    a. 09/2016 Echo: EF 30-35%.  . Colon polyps   . COPD (chronic obstructive pulmonary disease) ([place])   . Depression   .  Family history of adverse reaction to anesthesia    Uncle was positive for malignant hyperthermia; patient had testing done and was negative.  . Fibromyalgia   . GERD (gastroesophageal reflux disease)   . Gout   . High cholesterol   . History of blood transfusion 06/2015   "bleeding from my rectum"  . History of hiatal hernia   . HOH (hard of hearing)   . Hx of colonic polyps 03/21/2016   3 small adenomas no recall - co-morbidities  . Neuropathy    Disc Back   . NICM (nonischemic cardiomyopathy) ([place])    a. Previously worked up in [place], MD-->low EF with subsequent recovery.  Multiple caths (last ~ 2014 per pt report)--reportedly nl cors;  b. 09/2016 Echo: EF 30-35%, antsept/apical HK, mild MR, mildly dil LA, mod dil RA;  c. 09/2016 [place] MV: EF 26%, glob HK, sept DK, med size, mod intensity fixed septal defect - BBB/PVC related artifact, no ischemia.  . On home oxygen therapy    "3L; 24/7" (03/14/2016)  . OSA treated with BiPAP    uses  biPAP, 10 (03/14/2016)  . Osteoarthritis   . Oxygen deficiency   . Pneumonia   . Type II diabetes mellitus ([place])     Past Surgical History:  Procedure Laterality Date  . APPENDECTOMY     "they busted"  . bladder stimulator     pt states, "it cannot be turned off; it's in my right hip; dead battery so it's not working anymore". (03/14/2016)  . BLADDER SUSPENSION     2003, 2006 and 2010  . CATARACT EXTRACTION W/PHACO Right 11/29/2014   Procedure: CATARACT EXTRACTION PHACO AND INTRAOCULAR LENS PLACEMENT (IOC);  Surgeon: Rutherford Guys, MD;  Location: [facility];  Service: Ophthalmology;  Laterality: Right;  CDE:3.81  . CATARACT EXTRACTION W/PHACO Left 12/13/2014   Procedure: CATARACT EXTRACTION PHACO AND INTRAOCULAR LENS PLACEMENT (IOC);  Surgeon: Rutherford Guys, MD;  Location: [facility];  Service: Ophthalmology;  Laterality: Left;  CDE:6.59  . CERVICAL DISC SURGERY N/A 2009   4, 6, and 7 cervical disc replaced  . CHOLECYSTECTOMY N/A 09/13/2016   Procedure: LAPAROSCOPIC CHOLECYSTECTOMY;  Surgeon: Rolm Bookbinder, MD;  Location: [facility];  Service: General;  Laterality: N/A;  . COLONOSCOPY WITH PROPOFOL N/A 03/15/2016   Procedure: COLONOSCOPY WITH PROPOFOL;  Surgeon: Gatha Mayer, MD;  Location: [facility];  Service: Endoscopy;  Laterality: N/A;  . ESOPHAGOGASTRODUODENOSCOPY (EGD) WITH PROPOFOL N/A 03/15/2016   Procedure: ESOPHAGOGASTRODUODENOSCOPY (EGD) WITH PROPOFOL;  Surgeon: Gatha Mayer, MD;  Location: [facility];  Service: Endoscopy;  Laterality: N/A;  . HEEL SPUR SURGERY Bilateral   . HERNIA REPAIR    . I&D EXTREMITY Right 06/13/2015   Procedure: MINOR IRRIGATION AND DEBRIDEMENT EXTREMITY REMOVAL OF NAIL;  Surgeon: Daryll Brod, MD;  Location: [facility];  Service: Orthopedics;  Laterality: Right;  . TUBAL LIGATION    . UMBILICAL HERNIA REPAIR     w/mesh    FAMHx:  Family History  Problem Relation Age of Onset  . Heart disease Mother   . COPD Mother   . Diabetes  Mother   . Breast cancer Mother   . Heart disease Father   . Hyperlipidemia Father   . COPD Sister   . Heart disease Sister   . Diabetes Sister   . Heart disease Maternal Grandmother     SOCHx:   reports that she quit smoking about 27 years ago. Her smoking use included Cigarettes. She has a 28.00  pack-year smoking history. She has never used smokeless tobacco. She reports that she does not drink alcohol or use drugs.  ALLERGIES:  Allergies  Allergen Reactions  . Xarelto [Rivaroxaban] Other (See Comments)    Internal bleeding  . Ancef [Cefazolin] Nausea And Vomiting  . Levaquin [Levofloxacin In D5w] Other (See Comments)    "afib"  . Lyrica [Pregabalin] Hives  . Tamiflu [Oseltamivir Phosphate] Other (See Comments)    "water blisters"  . Zoloft [Sertraline Hcl] Other (See Comments)    Jaw problems, jittery  . Augmentin [Amoxicillin-Pot Clavulanate] Itching  . Ciprofloxacin Itching and Nausea And Vomiting  . Haldol [Haloperidol] Other (See Comments)    Restless leg  . Nsaids Diarrhea  . Penicillins Itching, Nausea And Vomiting and Rash    Has patient had a PCN reaction causing immediate rash, facial/tongue/throat swelling, SOB or lightheadedness with hypotension: Yes Has patient had a PCN reaction causing severe rash involving mucus membranes or skin necrosis: No Has patient had a PCN reaction that required hospitalization No Has patient had a PCN reaction occurring within the last 10 years: Yes If all of the above answers are "NO", then may proceed with Cephalosporin use.  . Topamax [Topiramate] Nausea Only    ROS: Pertinent items noted in HPI and remainder of comprehensive ROS otherwise negative.  HOME MEDS: Current Outpatient Prescriptions  Medication Sig Dispense Refill  . albuterol (PROVENTIL) (2.5 MG/3ML) 0.083% nebulizer solution Take 3 mLs (2.5 mg total) by nebulization every 6 (six) hours as needed for wheezing or shortness of breath. 75 mL 5  . atorvastatin  (LIPITOR) 40 MG tablet Take 1 tablet (40 mg total) by mouth daily. 90 tablet 3  . cetirizine (ZYRTEC) 10 MG tablet Take 1 tablet (10 mg total) by mouth at bedtime. 30 tablet 11  . cholecalciferol (VITAMIN D) 400 units TABS tablet Take 1 tablet (400 Units total) by mouth 2 (two) times daily. 180 each 3  . docusate sodium (COLACE) 100 MG capsule Take 100 mg by mouth daily.    Marland Kitchen escitalopram (LEXAPRO) 10 MG tablet Take 10 mg by mouth at bedtime.     . fenofibrate 54 MG tablet Take 1 tablet (54 mg total) by mouth daily. 30 tablet 3  . ferrous sulfate 324 (65 Fe) MG TBEC Take 1 tablet by mouth daily.    . furosemide (LASIX) 40 MG tablet Take 2 tablets (80 mg total) by mouth 2 (two) times daily. 360 tablet 3  . Insulin Glargine (BASAGLAR KWIKPEN) 100 UNIT/ML SOPN Inject 0.3 mLs (30 Units total) into the skin 2 (two) times daily. Inject 25 units into the skin daily 10 pen 5  . insulin lispro (HUMALOG KWIKPEN) 100 UNIT/ML KiwkPen Inject 16 units at breakfast, 20-24 units at lunch and dinner. Max dose daily- 64 units. 35 pen 3  . Insulin Pen Needle 32G X 4 MM MISC Use to inject insulin 5 times daily 500 each 5  . ipratropium (ATROVENT) 0.02 % nebulizer solution Take 2.5 mLs (0.5 mg total) by nebulization every 6 (six) hours as needed for wheezing or shortness of breath. 75 mL 5  . losartan (COZAAR) 25 MG tablet Take 1 tablet (25 mg total) by mouth daily. 30 tablet 5  . metFORMIN (GLUCOPHAGE-XR) 500 MG 24 hr tablet Take 1 tablet (500 mg total) by mouth 2 (two) times daily. 180 tablet 2  . metolazone (ZAROXOLYN) 2.5 MG tablet Take 1 tablet (2.5 mg total) by mouth daily. 90 tablet 3  . metoprolol succinate (TOPROL-XL)  25 MG 24 hr tablet Take 1 tablet (25 mg total) by mouth 2 (two) times daily. 180 tablet 3  . mirtazapine (REMERON) 15 MG tablet Take 15 mg by mouth at bedtime.    . modafinil (PROVIGIL) 200 MG tablet Take 1 tablet (200 mg total) by mouth daily. 30 tablet 5  . mometasone (NASONEX) 50 MCG/ACT nasal  spray Place 1 spray into the nose daily. 17 g 11  . montelukast (SINGULAIR) 10 MG tablet Take 1 tablet (10 mg total) by mouth at bedtime. 30 tablet 6  . nitroGLYCERIN (NITROSTAT) 0.4 MG SL tablet Place 1 tablet (0.4 mg total) under the tongue every 5 (five) minutes as needed for chest pain. 25 tablet 3  . nystatin (NYSTATIN) powder Apply topically 2 (two) times daily. Apply under breasts 15 g 3  . ondansetron (ZOFRAN-ODT) 4 MG disintegrating tablet Take 1 tablet (4 mg total) by mouth 2 (two) times daily as needed for nausea or vomiting. 30 tablet 0  . oxyCODONE-acetaminophen (PERCOCET/ROXICET) 5-325 MG tablet Take 1 tablet by mouth every 8 (eight) hours as needed for severe pain.    . OXYGEN Inhale 3 L into the lungs continuous. 3 liters 24/7    . pantoprazole (PROTONIX) 20 MG tablet Take 1 tablet (20 mg total) by mouth 2 (two) times daily before a meal. 180 tablet 1  . PRESCRIPTION MEDICATION Inhale into the lungs See admin instructions. Use BIPAP every time laying down    . primidone (MYSOLINE) 50 MG tablet Take 1 tablet (50 mg total) by mouth at bedtime. 90 tablet 3  . Probiotic Product (PROBIOTIC PO) Take 1 tablet by mouth at bedtime.    Marland Kitchen QUEtiapine (SEROQUEL) 50 MG tablet Take 50 mg by mouth at bedtime.   0  . triamcinolone lotion (KENALOG) 0.1 % Apply 1 application topically 3 (three) times daily. For up to 14 days and as needed 60 mL 1  . vitamin A 10000 UNIT capsule Take 10,000 Units by mouth every morning.    . warfarin (COUMADIN) 4 MG tablet TAKE 1 TABLET BY MOUTH DAILY EXCEPT 1/2 TABLET ON TUESDAYS 30 tablet 1   No current facility-administered medications for this visit.     LABS/IMAGING: No results found for this or any previous visit (from the past 48 hour(s)). No results found.  VITALS: BP (!) 112/56   Pulse 77   Ht 4\' 11"  (1.499 m)   Wt 197 lb (89.4 kg)   BMI 39.79 kg/m   EXAM: General appearance: alert, appears older than stated age and no distress Neck: no carotid  bruit and no JVD Lungs: diminished breath sounds bilaterally Heart: irregularly irregular rhythm Abdomen: soft, non-tender; bowel sounds normal; no masses,  no organomegaly Extremities: extremities normal, atraumatic, no cyanosis or edema Pulses: 2+ and symmetric Skin: Skin color, texture, turgor normal. No rashes or lesions Neurologic: Grossly normal Psych: pleasant  EKG: Atrial fibrillation with PACs, LAFB, anterolateral infarct pattern-personally reviewed  ASSESSMENT: 1. Permanent atrial fibrillation - not on anticoagulation due to recent GI bleeding and vision loss attributed to stroke 2. Acute on Chronic combined congestive heart failure - EF 30-35% 3. History of stress-induced cardiomyopathy 4. No significant obstructive coronary disease after multiple catheterizations in 1996, 99, 2007 and 2014. 5. Fibromyalgia 6. Bipolar 1 disorder 7. Dyslipidemia 8. Hypertension 9. Severe COPD on home oxygen 10. Obstructive sleep apnea on CPAP 11. Morbid obesity - with weight loss recently 12. DNR  PLAN: 1.   Denise Freeman seems to be well  compensated with her eyes were volume status today however weight is up significantly since her last office visit. Blood sugars not been as well controlled and I suspect this may be due to her diet, more than heart failure. She is on metolazone daily now as well as Lasix 80 mg twice a day. We'll continue her current medications. Will need to follow her kidney function closely. Plan follow-up in 6 months or sooner as necessary.  Pixie Casino, MD, [facility] Attending Cardiologist [place] 03/12/2017, 1:06 PM

## 2017-03-12 NOTE — Patient Instructions (Addendum)
Your physician wants you to follow-up in: 6 months with [name] after labs (lipid + CMET). You will receive a reminder letter in the mail two months in advance. If you don't receive a letter, please call our office to schedule the follow-up appointment.

## 2017-03-17 ENCOUNTER — Telehealth: Payer: Self-pay | Admitting: Internal Medicine

## 2017-03-17 NOTE — Telephone Encounter (Signed)
SPOKE TO PATIENT INFORMATION GIVEN

## 2017-03-17 NOTE — Telephone Encounter (Signed)
New message  Pt verbalized that she is calling for the rn   Because she was referred to an endocrinologist   By Dr.Hilty and she dont know who it is

## 2017-03-17 NOTE — Telephone Encounter (Signed)
LEFT MESSAGE   DR Heriberto Antigua NIDA - ENDOCRINOLOGIST  [address] [place]  [place] INFORMATION ON AFTER VISIT SUMMARY  ANY QUESTION MAY CALL BACK

## 2017-03-18 ENCOUNTER — Other Ambulatory Visit: Payer: Medicare Other

## 2017-03-18 ENCOUNTER — Ambulatory Visit: Payer: Medicare Other

## 2017-03-21 DIAGNOSIS — G894 Chronic pain syndrome: Secondary | ICD-10-CM | POA: Diagnosis not present

## 2017-03-21 DIAGNOSIS — M5032 Other cervical disc degeneration, mid-cervical region, unspecified level: Secondary | ICD-10-CM | POA: Diagnosis not present

## 2017-03-21 DIAGNOSIS — Z6838 Body mass index (BMI) 38.0-38.9, adult: Secondary | ICD-10-CM | POA: Diagnosis not present

## 2017-03-21 DIAGNOSIS — M5136 Other intervertebral disc degeneration, lumbar region: Secondary | ICD-10-CM | POA: Diagnosis not present

## 2017-03-24 ENCOUNTER — Other Ambulatory Visit: Payer: Self-pay | Admitting: *Deleted

## 2017-03-24 NOTE — Patient Outreach (Signed)
[facility]) Care Management   03/24/2017  Denise Freeman 04/20/1951 [id number]  Denise Freeman is an 66 y.o. 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   Denise Freeman was referred to [place] community coordinator on 09/16/16 by [facility] hospital liaison [facility]), V Brewer,for Transition of care services after her 09/14/16 hospital discharge for RUQ abdominal pain with s/p high risk laparoscopic cholecystectomy on 09/13/16 for acute cholecystitis without biliary dilation.   She is now being followed for complex [facility] CM services.     Subjective: see notes below   Objective:   BP 130/78   Pulse 85   Temp (!) 97.5 F (36.4 C) (Oral)   Resp 18   SpO2 98%   Review of Systems  HENT: Negative.   Eyes: Negative.   Respiratory: Negative.   Cardiovascular: Positive for leg swelling.       Some swelling minimal around left ankle  Gastrointestinal: Negative.   Genitourinary: Negative.   Musculoskeletal: Positive for joint pain.       Left hip pain popping   Skin: Negative.   Neurological: Positive for weakness. Negative for dizziness, tingling, tremors, sensory change, speech change, focal weakness, seizures, loss of consciousness and headaches.  Endo/Heme/Allergies: Bruises/bleeds easily.  Psychiatric/Behavioral: The patient is nervous/anxious.     Physical Exam  Constitutional: She is oriented to person, place, and time. She appears well-developed and well-nourished.  HENT:  Head: Normocephalic and atraumatic.  Eyes: Pupils are equal, round, and reactive to light.  Neck: Normal range of motion.  Cardiovascular: Normal rate and regular rhythm.   Respiratory: Effort normal  and breath sounds normal.  GI: Soft. Bowel sounds are normal.  Musculoskeletal: Normal range of motion.  Neurological: She is alert and oriented to person, place, and time.  Skin: Skin is warm and dry.  Psychiatric: She has a normal mood and affect. Her behavior is normal. Judgment and thought content normal.    Encounter Medications:   Outpatient Encounter Prescriptions as of 03/24/2017  Medication Sig  . albuterol (PROVENTIL) (2.5 MG/3ML) 0.083% nebulizer solution Take 3 mLs (2.5 mg total) by nebulization every 6 (six) hours as needed for wheezing or shortness of breath.  Marland Kitchen atorvastatin (LIPITOR) 40 MG tablet Take 1 tablet (40 mg total) by mouth daily.  . cetirizine (ZYRTEC) 10 MG tablet Take 1 tablet (10 mg total) by mouth at bedtime.  . cholecalciferol (VITAMIN D) 400 units TABS tablet Take 1 tablet (400 Units total) by mouth 2 (two) times daily.  Marland Kitchen docusate sodium (COLACE) 100 MG capsule Take 100 mg by mouth daily.  Marland Kitchen escitalopram (LEXAPRO) 10 MG tablet Take 10 mg by mouth at bedtime.   . fenofibrate 54 MG tablet Take 1 tablet (54 mg total) by mouth daily.  . ferrous sulfate 324 (65 Fe) MG TBEC Take 1 tablet by mouth daily.  . furosemide (LASIX) 40 MG tablet Take 2 tablets (80 mg total) by mouth 2 (two) times daily.  . Insulin Glargine (BASAGLAR KWIKPEN) 100 UNIT/ML SOPN Inject 0.3 mLs (30 Units total) into the skin 2 (two) times daily. Inject 25 units into the skin daily  . insulin lispro (HUMALOG KWIKPEN) 100 UNIT/ML KiwkPen Inject 16 units at breakfast,  20-24 units at lunch and dinner. Max dose daily- 64 units.  . Insulin Pen Needle 32G X 4 MM MISC Use to inject insulin 5 times daily  . ipratropium (ATROVENT) 0.02 % nebulizer solution Take 2.5 mLs (0.5 mg total) by nebulization every 6 (six) hours as needed for wheezing or shortness of breath.  . losartan (COZAAR) 25 MG tablet Take 1 tablet (25 mg total) by mouth daily.  . metFORMIN (GLUCOPHAGE-XR) 500 MG 24 hr tablet Take 1 tablet  (500 mg total) by mouth 2 (two) times daily.  . metolazone (ZAROXOLYN) 2.5 MG tablet Take 1 tablet (2.5 mg total) by mouth daily.  . metoprolol succinate (TOPROL-XL) 25 MG 24 hr tablet Take 1 tablet (25 mg total) by mouth 2 (two) times daily.  . mirtazapine (REMERON) 15 MG tablet Take 15 mg by mouth at bedtime.  . modafinil (PROVIGIL) 200 MG tablet Take 1 tablet (200 mg total) by mouth daily.  . mometasone (NASONEX) 50 MCG/ACT nasal spray Place 1 spray into the nose daily.  . montelukast (SINGULAIR) 10 MG tablet Take 1 tablet (10 mg total) by mouth at bedtime.  . nitroGLYCERIN (NITROSTAT) 0.4 MG SL tablet Place 1 tablet (0.4 mg total) under the tongue every 5 (five) minutes as needed for chest pain.  Marland Kitchen nystatin (NYSTATIN) powder Apply topically 2 (two) times daily. Apply under breasts  . ondansetron (ZOFRAN-ODT) 4 MG disintegrating tablet Take 1 tablet (4 mg total) by mouth 2 (two) times daily as needed for nausea or vomiting.  Marland Kitchen oxyCODONE-acetaminophen (PERCOCET/ROXICET) 5-325 MG tablet Take 1 tablet by mouth every 8 (eight) hours as needed for severe pain.  . OXYGEN Inhale 3 L into the lungs continuous. 3 liters 24/7  . pantoprazole (PROTONIX) 20 MG tablet Take 1 tablet (20 mg total) by mouth 2 (two) times daily before a meal.  . PRESCRIPTION MEDICATION Inhale into the lungs See admin instructions. Use BIPAP every time laying down  . primidone (MYSOLINE) 50 MG tablet Take 1 tablet (50 mg total) by mouth at bedtime.  . Probiotic Product (PROBIOTIC PO) Take 1 tablet by mouth at bedtime.  Marland Kitchen QUEtiapine (SEROQUEL) 50 MG tablet Take 50 mg by mouth at bedtime.   . triamcinolone lotion (KENALOG) 0.1 % Apply 1 application topically 3 (three) times daily. For up to 14 days and as needed  . vitamin A 10000 UNIT capsule Take 10,000 Units by mouth every morning.  . warfarin (COUMADIN) 4 MG tablet TAKE 1 TABLET BY MOUTH DAILY EXCEPT 1/2 TABLET ON TUESDAYS   No facility-administered encounter medications on  file as of 03/24/2017.     Functional Status:   In your present state of health, do you have any difficulty performing the following activities: 10/16/2016 09/23/2016  Hearing? Y Y  Comment has a hearing aide, but doesn't always wear them -  Vision? [name]  Comment wears glasses, but is legally blind -  Difficulty concentrating or making decisions? Y N  Walking or climbing stairs? Y Y  Dressing or bathing? N N  Doing errands, shopping? N Y  Some recent data might be hidden    Fall/Depression Screening:    Fall Risk  01/03/2017 12/12/2016 11/26/2016  Falls in the past year? Yes Yes No  Number falls in past yr: 2 or more - -  Injury with Fall? No Yes -  Risk Factor Category  - - -  Comment - - -  Risk for fall due to : Other (Comment);Medication side effect Other (Comment);Impaired  vision -  Risk for fall due to: Comment hypoglycemia patient fell out of her bed -  Follow up - - -   PHQ 2/9 Scores 12/12/2016 12/05/2016 11/26/2016 11/04/2016 10/16/2016 09/06/2016 07/18/2016  PHQ - 2 Score 2 0 0 0 2 0 0  PHQ- 9 Score 5 - - - 9 - -    Assessment:   Met with Denise Freeman at her home at her kitchen table  Medications awaiting delivery of pain medications 03/21/17 saw pain management dr   Mental Health Denise Freeman discussed stress with her daughter, Denise Freeman, in which she is in a disagreement with at this time related to rental concerns with her trailer and family dynamics.  Falls none since last home visit but has a bruise on her right forearm after holding the ladder for her husband when working on their home   Diabetes Denise Freeman will pick up her DM shoes soon She is managing her DM at home with diet and medications and continues to work on decrease of her HgA1c  Flu shot on January 27 2017    Upcoming appointments  05/01/17 [place] MD 05/02/17 1030 Dr Dorris Fetch  05/12/17 was rescheduled from 04/15/17 dr Dominica Severin rankin to be see  Plan:   [facility] cm called and spoke with amanda to cancel the 04/30/17 3  month follow up appointment - reason "not returning"  [facility] CM left a voice message for [facility] to attempt to find name of provider pt is to see on 05/01/17 she lost her card with name of provider   [facility] cm spoke with trey at [facility] [place] delivery in en route they are in route    Agreed to be sent to health coach   [place] L. Lavina Hamman, RN, BSN, [place] [facility] [phone]

## 2017-03-25 ENCOUNTER — Telehealth: Payer: Self-pay | Admitting: *Deleted

## 2017-03-25 NOTE — Patient Outreach (Signed)
[facility]) Care Management  03/25/2017  Curley [place] [facility] [birth year] [id number]   Care coordination   [facility] CM received a return call from [place] at [facility] to confirm Mrs [facility] 03/31/17 appointment is with Dr Gilford Rile and Mrs Verno will need to be in the office early to complete her new patient forms  Plans Mrs [place] will be updated and encouraged to arrive early to her new behavioral health appointment Mrs  Alderete will be reviewed for transitioning her to [facility] health coach as discussed with her on 03/24/17   Joelene Millin L. Lavina Hamman, RN, BSN, [place] [facility] Management [phone]

## 2017-03-26 ENCOUNTER — Other Ambulatory Visit: Payer: Self-pay | Admitting: Internal Medicine
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
## 2017-03-31 ENCOUNTER — Ambulatory Visit (INDEPENDENT_AMBULATORY_CARE_PROVIDER_SITE_OTHER): Payer: Medicare Other | Admitting: Psychiatry

## 2017-03-31 ENCOUNTER — Encounter (HOSPITAL_COMMUNITY): Payer: Self-pay | Admitting: Psychiatry

## 2017-03-31 VITALS — BP 102/50 | HR 64 | Ht 59.0 in | Wt 189.0 lb

## 2017-03-31 DIAGNOSIS — F0630 Mood disorder due to known physiological condition, unspecified: Secondary | ICD-10-CM | POA: Diagnosis not present

## 2017-03-31 MED ORDER — QUETIAPINE FUMARATE 25 MG PO TABS: 25.0000 mg | ORAL_TABLET | Freq: Every day | ORAL | 0 refills | Status: DC

## 2017-03-31 MED ORDER — MIRTAZAPINE 15 MG PO TABS: 15.0000 mg | ORAL_TABLET | Freq: Every day | ORAL | 0 refills | Status: DC

## 2017-03-31 MED ORDER — ESCITALOPRAM OXALATE 10 MG PO TABS: 10.0000 mg | ORAL_TABLET | Freq: Every day | ORAL | 0 refills | Status: DC

## 2017-03-31 NOTE — Patient Instructions (Addendum)
1. Continue lexapro 10 mg at night 2. Continue mirtazapine 15 mg at night 3. Continue quetiapine 25 mg at night 4. Return to clinic in one month for 30 mins

## 2017-04-01 ENCOUNTER — Encounter: Payer: Self-pay | Admitting: "Endocrinology

## 2017-04-01 ENCOUNTER — Ambulatory Visit (INDEPENDENT_AMBULATORY_CARE_PROVIDER_SITE_OTHER): Payer: Medicare Other | Admitting: "Endocrinology

## 2017-04-01 VITALS — BP 114/61 | HR 91 | Ht 59.0 in | Wt 193.0 lb

## 2017-04-01 DIAGNOSIS — Z794 Long term (current) use of insulin: Secondary | ICD-10-CM

## 2017-04-01 DIAGNOSIS — Z6838 Body mass index (BMI) 38.0-38.9, adult: Secondary | ICD-10-CM | POA: Diagnosis not present

## 2017-04-01 DIAGNOSIS — I10 Essential (primary) hypertension: Secondary | ICD-10-CM | POA: Diagnosis not present

## 2017-04-01 DIAGNOSIS — E1122 Type 2 diabetes mellitus with diabetic chronic kidney disease: Secondary | ICD-10-CM | POA: Insufficient documentation

## 2017-04-01 DIAGNOSIS — N183 Chronic kidney disease, stage 3 (moderate): Secondary | ICD-10-CM | POA: Diagnosis not present

## 2017-04-01 DIAGNOSIS — E782 Mixed hyperlipidemia: Secondary | ICD-10-CM

## 2017-04-01 DIAGNOSIS — E1159 Type 2 diabetes mellitus with other circulatory complications: Secondary | ICD-10-CM | POA: Diagnosis not present

## 2017-04-01 MED ORDER — METFORMIN HCL ER 500 MG PO TB24: 500.0000 mg | ORAL_TABLET | Freq: Two times a day (BID) | ORAL | 2 refills | Status: DC

## 2017-04-01 MED ORDER — FREESTYLE LIBRE READER DEVI: 1.0000 | Freq: Once | 0 refills | Status: AC

## 2017-04-01 MED ORDER — FENOFIBRATE 54 MG PO TABS: 54.0000 mg | ORAL_TABLET | Freq: Every day | ORAL | 3 refills | Status: DC

## 2017-04-01 MED ORDER — FREESTYLE LIBRE SENSOR SYSTEM MISC: 2 refills | Status: DC

## 2017-04-01 MED ORDER — INSULIN LISPRO 100 UNIT/ML (KWIKPEN): 10.0000 [IU] | PEN_INJECTOR | Freq: Three times a day (TID) | SUBCUTANEOUS | 3 refills | Status: DC

## 2017-04-01 NOTE — Progress Notes (Signed)
Consult Note       04/01/2017, 12:59 PM   Subjective:    Patient ID: Denise Freeman, female    DOB: 1950-11-14.  she is being seen in consultation for management of currently uncontrolled symptomatic diabetes requested by  Rogue Bussing, MD.   Past Medical History:  Diagnosis Date  . Allergy   . Anemia   . Anxiety   . Asthma   . Atrial fibrillation ([place])   . Bipolar 1 disorder ([place])   . 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 Cholelithiasis    a. 09/2016 s/p Lap Chole.  . Chronic bronchitis ([place])   . Chronic combined systolic and diastolic congestive heart failure ([place])    a. 09/2016 Echo: EF 30-35%.  . Colon polyps   . COPD (chronic obstructive pulmonary disease) ([place])   . Depression   . Family history of adverse reaction to anesthesia    Uncle was positive for malignant hyperthermia; patient had testing done and was negative.  . Fibromyalgia   . GERD (gastroesophageal reflux disease)   . Gout   . High cholesterol   . History of blood transfusion 06/2015   "bleeding from my rectum"  . History of hiatal hernia   . HOH (hard of hearing)   . Hx of colonic polyps 03/21/2016   3 small adenomas no recall - co-morbidities  . Neuropathy    Disc Back   . NICM (nonischemic cardiomyopathy) ([place])    a. Previously worked up in [place], MD-->low EF with subsequent recovery.  Multiple caths (last ~ 2014 per pt report)--reportedly nl cors;  b. 09/2016 Echo: EF 30-35%, antsept/apical HK, mild MR, mildly dil LA, mod dil RA;  c. 09/2016 Lexi MV: EF 26%, glob HK, sept DK, med size, mod intensity fixed septal defect - BBB/PVC related artifact, no ischemia.  . On home oxygen therapy    "3L; 24/7" (03/14/2016)  . OSA treated with BiPAP    uses biPAP, 10 (03/14/2016)  . Osteoarthritis   . Oxygen deficiency   . Pneumonia   . Type II diabetes mellitus ([place])    Past Surgical History:   Procedure Laterality Date  . APPENDECTOMY     "they busted"  . bladder stimulator     pt states, "it cannot be turned off; it's in my right hip; dead battery so it's not working anymore". (03/14/2016)  . BLADDER SUSPENSION     2003, 2006 and 2010  . CATARACT EXTRACTION W/PHACO Right 11/29/2014   Procedure: CATARACT EXTRACTION PHACO AND INTRAOCULAR LENS PLACEMENT (IOC);  Surgeon: Rutherford Guys, MD;  Location: [facility];  Service: Ophthalmology;  Laterality: Right;  CDE:3.81  . CATARACT EXTRACTION W/PHACO Left 12/13/2014   Procedure: CATARACT EXTRACTION PHACO AND INTRAOCULAR LENS PLACEMENT (IOC);  Surgeon: Rutherford Guys, MD;  Location: [facility];  Service: Ophthalmology;  Laterality: Left;  CDE:6.59  . CERVICAL DISC SURGERY N/A 2009   4, 6, and 7 cervical disc replaced  . CHOLECYSTECTOMY N/A 09/13/2016   Procedure: LAPAROSCOPIC CHOLECYSTECTOMY;  Surgeon: Rolm Bookbinder, MD;  Location: [facility];  Service: General;  Laterality: N/A;  . COLONOSCOPY  WITH PROPOFOL N/A 03/15/2016   Procedure: COLONOSCOPY WITH PROPOFOL;  Surgeon: Gatha Mayer, MD;  Location: [facility];  Service: Endoscopy;  Laterality: N/A;  . ESOPHAGOGASTRODUODENOSCOPY (EGD) WITH PROPOFOL N/A 03/15/2016   Procedure: ESOPHAGOGASTRODUODENOSCOPY (EGD) WITH PROPOFOL;  Surgeon: Gatha Mayer, MD;  Location: [facility];  Service: Endoscopy;  Laterality: N/A;  . HEEL SPUR SURGERY Bilateral   . HERNIA REPAIR    . I&D EXTREMITY Right 06/13/2015   Procedure: MINOR IRRIGATION AND DEBRIDEMENT EXTREMITY REMOVAL OF NAIL;  Surgeon: Daryll Brod, MD;  Location: [facility];  Service: Orthopedics;  Laterality: Right;  . TUBAL LIGATION    . UMBILICAL HERNIA REPAIR     w/mesh   Social History   Social History  . Marital status: Married    Spouse name: N/A  . Number of children: 1  . Years of education: N/A   Occupational History  . house wife    Social History Main Topics  . Smoking status: Former Smoker    Packs/day: 2.00     Years: 14.00    Types: Cigarettes    Quit date: 04/12/1989  . Smokeless tobacco: Never Used  . Alcohol use No  . Drug use: No  . Sexual activity: No   Other Topics Concern  . None   Social History Narrative   Married   Disabled   1 child; 45 yrs   6 pregnancies   1 child      Outpatient Encounter Prescriptions as of 04/01/2017  Medication Sig  . atorvastatin (LIPITOR) 40 MG tablet Take 1 tablet (40 mg total) by mouth daily.  . cetirizine (ZYRTEC) 10 MG tablet Take 1 tablet (10 mg total) by mouth at bedtime.  . cholecalciferol (VITAMIN D) 400 units TABS tablet Take 1 tablet (400 Units total) by mouth 2 (two) times daily.  Marland Kitchen docusate sodium (COLACE) 100 MG capsule Take 100 mg by mouth daily.  Marland Kitchen escitalopram (LEXAPRO) 10 MG tablet Take 1 tablet (10 mg total) by mouth at bedtime.  . fenofibrate 54 MG tablet Take 1 tablet (54 mg total) by mouth daily.  . ferrous sulfate 324 (65 Fe) MG TBEC Take 1 tablet by mouth daily.  . furosemide (LASIX) 40 MG tablet Take 2 tablets (80 mg total) by mouth 2 (two) times daily.  . Insulin Glargine (BASAGLAR KWIKPEN) 100 UNIT/ML SOPN Inject 40 Units into the skin every morning.  . insulin lispro (HUMALOG KWIKPEN) 100 UNIT/ML KiwkPen Inject 0.1 mLs (10 Units total) into the skin 3 (three) times daily before meals.  . Insulin Pen Needle 32G X 4 MM MISC Use to inject insulin 5 times daily  . ipratropium (ATROVENT) 0.02 % nebulizer solution Take 2.5 mLs (0.5 mg total) by nebulization every 6 (six) hours as needed for wheezing or shortness of breath.  . losartan (COZAAR) 25 MG tablet Take 1 tablet (25 mg total) by mouth daily.  . metFORMIN (GLUCOPHAGE-XR) 500 MG 24 hr tablet Take 1 tablet (500 mg total) by mouth 2 (two) times daily.  . metolazone (ZAROXOLYN) 2.5 MG tablet Take 1 tablet (2.5 mg total) by mouth daily.  . metoprolol succinate (TOPROL-XL) 25 MG 24 hr tablet Take 1 tablet (25 mg total) by mouth 2 (two) times daily.  . mirtazapine (REMERON) 15 MG  tablet Take 1 tablet (15 mg total) by mouth at bedtime.  . modafinil (PROVIGIL) 200 MG tablet Take 1 tablet (200 mg total) by mouth daily.  . nitroGLYCERIN (NITROSTAT) 0.4 MG SL tablet Place  1 tablet (0.4 mg total) under the tongue every 5 (five) minutes as needed for chest pain.  Marland Kitchen nystatin (NYSTATIN) powder Apply topically 2 (two) times daily. Apply under breasts  . ondansetron (ZOFRAN-ODT) 4 MG disintegrating tablet Take 1 tablet (4 mg total) by mouth 2 (two) times daily as needed for nausea or vomiting.  Marland Kitchen oxyCODONE-acetaminophen (PERCOCET/ROXICET) 5-325 MG tablet Take 1 tablet by mouth every 8 (eight) hours as needed for severe pain.  . OXYGEN Inhale 3 L into the lungs continuous. 3 liters 24/7  . pantoprazole (PROTONIX) 20 MG tablet Take 1 tablet (20 mg total) by mouth 2 (two) times daily before a meal.  . PRESCRIPTION MEDICATION Inhale into the lungs See admin instructions. Use BIPAP every time laying down  . primidone (MYSOLINE) 50 MG tablet Take 1 tablet (50 mg total) by mouth at bedtime.  . Probiotic Product (PROBIOTIC PO) Take 1 tablet by mouth at bedtime.  Marland Kitchen QUEtiapine (SEROQUEL) 25 MG tablet Take 1 tablet (25 mg total) by mouth at bedtime.  . triamcinolone lotion (KENALOG) 0.1 % Apply 1 application topically 3 (three) times daily. For up to 14 days and as needed  . vitamin A 10000 UNIT capsule Take 10,000 Units by mouth every morning.  . warfarin (COUMADIN) 4 MG tablet TAKE 1 TABLET BY MOUTH DAILY EXCEPT 1/2 TABLET ON TUESDAYS  . [DISCONTINUED] fenofibrate 54 MG tablet Take 1 tablet (54 mg total) by mouth daily.  . [DISCONTINUED] insulin lispro (HUMALOG KWIKPEN) 100 UNIT/ML KiwkPen Inject 16 units at breakfast, 20-24 units at lunch and dinner. Max dose daily- 64 units. (Patient taking differently: Inject 22 units in the morning, 24 units before lunch, 28 units at night)  . [DISCONTINUED] metFORMIN (GLUCOPHAGE-XR) 500 MG 24 hr tablet Take 1 tablet (500 mg total) by mouth 2 (two) times  daily.  Marland Kitchen albuterol (PROVENTIL) (2.5 MG/3ML) 0.083% nebulizer solution Take 3 mLs (2.5 mg total) by nebulization every 6 (six) hours as needed for wheezing or shortness of breath.  . Continuous Blood Gluc Receiver (FREESTYLE LIBRE READER) DEVI 1 Piece by Does not apply route once.  . Continuous Blood Gluc Sensor (FREESTYLE LIBRE SENSOR SYSTEM) MISC Use one sensor every 10 days.  . mometasone (NASONEX) 50 MCG/ACT nasal spray Place 1 spray into the nose daily.  . montelukast (SINGULAIR) 10 MG tablet Take 1 tablet (10 mg total) by mouth at bedtime.  . [DISCONTINUED] Insulin Glargine (BASAGLAR KWIKPEN) 100 UNIT/ML SOPN Inject 0.3 mLs (30 Units total) into the skin 2 (two) times daily. Inject 25 units into the skin daily   No facility-administered encounter medications on file as of 04/01/2017.     ALLERGIES: Allergies  Allergen Reactions  . Xarelto [Rivaroxaban] Other (See Comments)    Internal bleeding  . Ancef [Cefazolin] Nausea And Vomiting  . Levaquin [Levofloxacin In D5w] Other (See Comments)    "afib"  . Lyrica [Pregabalin] Hives  . Tamiflu [Oseltamivir Phosphate] Other (See Comments)    "water blisters"  . Zoloft [Sertraline Hcl] Other (See Comments)    Jaw problems, jittery  . Augmentin [Amoxicillin-Pot Clavulanate] Itching  . Ciprofloxacin Itching and Nausea And Vomiting  . Haldol [Haloperidol] Other (See Comments)    Restless leg  . Nsaids Diarrhea  . Penicillins Itching, Nausea And Vomiting and Rash    Has patient had a PCN reaction causing immediate rash, facial/tongue/throat swelling, SOB or lightheadedness with hypotension: Yes Has patient had a PCN reaction causing severe rash involving mucus membranes or skin necrosis: No Has  patient had a PCN reaction that required hospitalization No Has patient had a PCN reaction occurring within the last 10 years: Yes If all of the above answers are "NO", then may proceed with Cephalosporin use.  . Topamax [Topiramate] Nausea Only     VACCINATION STATUS: Immunization History  Administered Date(s) Administered  . Influenza Split 03/03/2014  . Influenza,inj,Quad PF,6+ Mos 01/30/2016, 01/27/2017  . Influenza-Unspecified 02/06/2015  . Pneumococcal Conjugate-13 01/30/2016  . Pneumococcal Polysaccharide-23 11/24/2016  . Tetanus 06/03/2013    Diabetes  She presents for her initial diabetic visit. She has type 2 diabetes mellitus. Onset time: She was diagnosed at approximate age of 30 years. Her disease course has been worsening. There are no hypoglycemic associated symptoms. Pertinent negatives for hypoglycemia include no confusion, headaches, pallor or seizures. Associated symptoms include fatigue, polydipsia, polyphagia and visual change. Pertinent negatives for diabetes include no chest pain and no polyuria. There are no hypoglycemic complications. Symptoms are worsening. Diabetic complications include heart disease, nephropathy, peripheral neuropathy and retinopathy. Risk factors for coronary artery disease include family history, dyslipidemia, diabetes mellitus, hypertension, obesity, post-menopausal, sedentary lifestyle and tobacco exposure. Current diabetic treatment includes insulin injections and oral agent (monotherapy) (She is currently on metformin 500 mg by mouth twice a day,Basaglar 40 units every morning, Humalog 22 units at breakfast, 24 units at lunch, and 28 units at supper.). Her weight is increasing steadily. She is following a generally unhealthy diet. When asked about meal planning, she reported none. She has not had a previous visit with a dietitian. She never participates in exercise. Her breakfast blood glucose range is generally 180-200 mg/dl. Her lunch blood glucose range is generally 180-200 mg/dl. Her dinner blood glucose range is generally 180-200 mg/dl. Her bedtime blood glucose range is generally 180-200 mg/dl. Her overall blood glucose range is 180-200 mg/dl. An ACE inhibitor/angiotensin II receptor  blocker is being taken. She does not see a podiatrist.Eye exam is current (She is legally blind on both eyes reportedly unrelated to her diabetes.).  Hyperlipidemia  This is a chronic problem. The current episode started more than 1 year ago. The problem is controlled. Exacerbating diseases include diabetes, hypothyroidism and obesity. Pertinent negatives include no chest pain, myalgias or shortness of breath. Current antihyperlipidemic treatment includes statins and fibric acid derivatives. Risk factors for coronary artery disease include diabetes mellitus, dyslipidemia, family history, hypertension, obesity, a sedentary lifestyle and post-menopausal.  Hypertension  This is a chronic problem. The current episode started more than 1 year ago. Pertinent negatives include no chest pain, headaches, palpitations or shortness of breath. Risk factors for coronary artery disease include dyslipidemia, diabetes mellitus, obesity, sedentary lifestyle and smoking/tobacco exposure. Past treatments include angiotensin blockers and diuretics. Hypertensive end-organ damage includes retinopathy.      Review of Systems  Constitutional: Positive for fatigue. Negative for chills, fever and unexpected weight change.  HENT: Negative for trouble swallowing and voice change.   Eyes: Negative for visual disturbance.  Respiratory: Negative for cough, shortness of breath and wheezing.   Cardiovascular: Negative for chest pain, palpitations and leg swelling.  Gastrointestinal: Negative for diarrhea, nausea and vomiting.  Endocrine: Positive for polydipsia and polyphagia. Negative for cold intolerance, heat intolerance and polyuria.  Musculoskeletal: Positive for gait problem. Negative for arthralgias and myalgias.       Patient uses a specialized walker due to deconditioning, disequilibrium, blindness.  Skin: Negative for color change, pallor, rash and wound.  Neurological: Negative for seizures and headaches.   Psychiatric/Behavioral: Negative for confusion and  suicidal ideas.    Objective:    BP 114/61   Pulse 91   Ht 4\' 11"  (1.499 m)   Wt 193 lb (87.5 kg)   BMI 38.98 kg/m   Wt Readings from Last 3 Encounters:  04/01/17 193 lb (87.5 kg)  03/12/17 197 lb (89.4 kg)  02/21/17 180 lb (81.6 kg)     Physical Exam  Constitutional: She is oriented to person, place, and time. She appears well-developed.  HENT:  Head: Normocephalic and atraumatic.  Eyes: EOM are normal.  Neck: Normal range of motion. Neck supple. No tracheal deviation present. No thyromegaly present.  Cardiovascular: Normal rate and regular rhythm.   Pulmonary/Chest: Effort normal and breath sounds normal.  Abdominal: Soft. Bowel sounds are normal. There is no tenderness. There is no guarding.  Musculoskeletal: She exhibits no edema.  She uses a specialized walker to ambulate due to deconditioning, disequilibrium, blindness.  Neurological: She is alert and oriented to person, place, and time. She has normal reflexes. No cranial nerve deficit. Coordination normal.  Hard of hearing.  Skin: Skin is warm and dry. No rash noted. No erythema. No pallor.  Psychiatric: She has a normal mood and affect. Judgment normal.    CMP     Component Value Date/Time   NA 142 01/08/2017 1145   K 3.8 01/08/2017 1145   CL 89 (L) 01/08/2017 1145   CO2 32 (H) 01/08/2017 1145   GLUCOSE 240 (H) 01/08/2017 1145   GLUCOSE 148 (H) 10/02/2016 1050   BUN 37 (H) 01/08/2017 1145   CREATININE 1.12 (H) 01/08/2017 1145   CREATININE 1.04 (H) 10/02/2016 1050   CALCIUM 9.4 01/08/2017 1145   PROT 5.8 (L) 09/14/2016 0450   ALBUMIN 3.1 (L) 09/14/2016 0450   AST 254 (H) 09/14/2016 0450   ALT 205 (H) 09/14/2016 0450   ALKPHOS 74 09/14/2016 0450   BILITOT 0.6 09/14/2016 0450   GFRNONAA 52 (L) 01/08/2017 1145   GFRNONAA 50 (L) 07/18/2016 1023   GFRAA 60 01/08/2017 1145   GFRAA 58 (L) 07/18/2016 1023     Diabetic Labs (most recent): Lab Results   Component Value Date   HGBA1C 7.9 01/27/2017   HGBA1C 8.1 09/27/2016   HGBA1C 7.5 07/22/2016     Lipid Panel ( most recent) Lipid Panel     Component Value Date/Time   CHOL 148 09/10/2016 1147   TRIG 150 (H) 09/10/2016 1147   HDL 24 (L) 09/10/2016 1147   CHOLHDL 6.2 09/10/2016 1147   VLDL 30 09/10/2016 1147   LDLCALC 94 09/10/2016 1147   LDLDIRECT 153.0 04/19/2016 0932      Assessment & Plan:   1. DM type 2 causing vascular disease , Stage 3 renal insufficiency, blindness - Patient has currently uncontrolled symptomatic type 2 DM since  66 years of age,  with most recent A1c of 7.9 %, from 01/27/2017.  - She does not have recent labs to review. She'll be sent for new set of labs.   -her diabetes is complicated by coronary artery disease, exercise information, stage 3 renal insufficiency, blindness and Denise Freeman remains at a high risk for more acute and chronic complications which include CAD, CVA, CKD, retinopathy, and neuropathy. These are all discussed in detail with the patient.  - I have counseled her on diet management and weight loss, by adopting a carbohydrate restricted/protein rich diet.  - Suggestion is made for her to avoid simple carbohydrates  from her diet including Cakes, Sweet Desserts, Ice Cream, Soda (  diet and regular), Sweet Tea, Candies, Chips, Cookies, Store Bought Juices, Alcohol in Excess of  1-2 drinks a day, Artificial Sweeteners, and "Sugar-free" Products. This will help patient to have stable blood glucose profile and potentially avoid unintended weight gain.  - I encouraged her to switch to  unprocessed or minimally processed complex starch and increased protein intake (animal or plant source), fruits, and vegetables.  - she is advised to stick to a routine mealtimes to eat 3 meals  a day and avoid unnecessary snacks ( to snack only to correct hypoglycemia).   - she will be scheduled with Jearld Fenton, RDN, CDE for individualized diabetes  education.  - I have approached her with the following individualized plan to manage diabetes and patient agrees:   - Given her blindness, it is very difficult to give her intensive insulin therapy. Her husband , who is already doing everything for her, is offering to help . - The #1 priority in her diabetes treatment  would be to avoid hypoglycemia.  - She is taking excessive amount of bolus insulin at this time.  - I  will proceed to shift her basal insulin  Basal insulin Basaglar 40 units daily at bedtime, lower her prandial insulin Humalog to 10 units 3 times a day before meals for pre-meal blood glucose above 90 mg/dL . - She is asked to start strict monitoring of glucose 4 times a day-before meals and at bedtime, and bring her meter and logs with her for reevaluation. - Patient is warned not to take insulin without proper monitoring per orders. -Adjustment parameters are given for hypo and hyperglycemia in writing. -Patient is encouraged to call clinic for blood glucose levels less than 70 or above 300 mg /dl. - She will benefit from continued glucose monitoring. I discussed and initiated a prescription for the [facility] device for her. - I will continue  low-dose metformin 500 mg by mouth twice a day, therapeutically suitable for patient .  -Patient is not a candidate for  SGLT2 inhibitors due to CKD.  - she will be considered for incretin therapy as appropriate next visit. - Patient specific target  A1c;  LDL, HDL, Triglycerides, and  Waist Circumference were discussed in detail.  2) BP/HTN: Controlled. Continue current medications including ACEI/ARB. 3) Lipids/HPL:   Controlled.   Patient is advised to continue statins. 4)  Weight/Diet: CDE Consult will be initiated , exercise, and detailed carbohydrates information provided.  5) Chronic Care/Health Maintenance:  -she  is on ACEI/ARB and Statin medications and  is encouraged to continue to follow up with Ophthalmology,  Dentist,  Podiatrist at least yearly or according to recommendations, and advised to  stay away from smoking. I have recommended yearly flu vaccine and pneumonia vaccination at least every 5 years; and  sleep for at least 7 hours a day.  - Time spent with the patient: 1 hour, of which >50% was spent in obtaining information about her symptoms, reviewing her previous labs, evaluations, and treatments, counseling her about her  currently uncontrolled, complicated type 2 diabetes; hypertension, hyperlipidemia, and developing a plan for long term treatment; her  questions were answered to her satisfaction.  - Patient to bring meter and  blood glucose logs during her next visit.  - I advised patient to maintain close follow up with Rogue Bussing, MD for primary care needs.  Follow up plan: - Return in about 1 week (around 04/08/2017) for meter, and logs, labs today, follow up with pre-visit  labs, meter, and logs.  Glade Lloyd, MD [facility] [address] [place], [place] [address] Phone: [phone]  Fax: [phone]    04/01/2017, 12:59 PM  This note was partially dictated with voice recognition software. Similar sounding words can be transcribed inadequately or may not  be corrected upon review.

## 2017-04-01 NOTE — Patient Instructions (Signed)

## 2017-04-02 LAB — COMPLETE METABOLIC PANEL WITH GFR
AG Ratio: 1.5 (calc) (ref 1.0–2.5)
ALBUMIN MSPROF: 4.4 g/dL (ref 3.6–5.1)
ALT: 8 U/L (ref 6–29)
AST: 15 U/L (ref 10–35)
Alkaline phosphatase (APISO): 55 U/L (ref 33–130)
BILIRUBIN TOTAL: 0.4 mg/dL (ref 0.2–1.2)
BUN / CREAT RATIO: 39 (calc) — AB (ref 6–22)
BUN: 49 mg/dL — AB (ref 7–25)
CALCIUM: 8.9 mg/dL (ref 8.6–10.4)
CHLORIDE: 90 mmol/L — AB (ref 98–110)
CO2: 38 mmol/L — AB (ref 20–32)
CREATININE: 1.25 mg/dL — AB (ref 0.50–0.99)
GFR, Est African American: 52 mL/min/{1.73_m2} — ABNORMAL LOW (ref >=60)
GFR, Est Non African American: 45 mL/min/{1.73_m2} — ABNORMAL LOW (ref >=60)
GLUCOSE: 174 mg/dL — AB (ref 65–99)
Globulin: 2.9 g/dL (calc) (ref 1.9–3.7)
POTASSIUM: 4 mmol/L (ref 3.5–5.3)
Sodium: 139 mmol/L (ref 135–146)
TOTAL PROTEIN: 7.3 g/dL (ref 6.1–8.1)

## 2017-04-02 LAB — HEMOGLOBIN A1C
EAG (MMOL/L): 10.6 (calc)
Hgb A1c MFr Bld: 8.3 % of total Hgb — ABNORMAL HIGH (ref <5.7)
Mean Plasma Glucose: 192 (calc)

## 2017-04-03 ENCOUNTER — Other Ambulatory Visit: Payer: Self-pay | Admitting: Pharmacist

## 2017-04-03 NOTE — Patient Outreach (Signed)
[facility] Care Management  04/03/2017  Denise Freeman Car Jun 07, 1950 [id number]   [place] on the patient's behalf to see when her insulin could be reordered. The representative said Basaglar and Humalog could be requested on 04/21/17.  I called the patient to update her. Unfortunately, she did not answer the phone.  HIPAA compliant message was left on the patient's voicemail.  Plan:  Call patient back to reorder her insulin after 05/01/17.  [name], PharmD, [place] Clinical Pharmacist [phone]

## 2017-04-09 ENCOUNTER — Ambulatory Visit (INDEPENDENT_AMBULATORY_CARE_PROVIDER_SITE_OTHER): Payer: Medicare Other | Admitting: *Deleted

## 2017-04-09 DIAGNOSIS — Z5181 Encounter for therapeutic drug level monitoring: Secondary | ICD-10-CM

## 2017-04-09 DIAGNOSIS — I480 Paroxysmal atrial fibrillation: Secondary | ICD-10-CM | POA: Diagnosis not present

## 2017-04-09 LAB — POCT INR: INR: 2

## 2017-04-09 MED ORDER — WARFARIN SODIUM 4 MG PO TABS: ORAL_TABLET | ORAL | 4 refills | Status: DC

## 2017-04-10 ENCOUNTER — Ambulatory Visit
Admission: RE | Admit: 2017-04-10 | Discharge: 2017-04-10 | Disposition: A | Discharge status: Home / Self Care | Payer: Medicare Other | Source: Ambulatory Visit | Attending: Family Medicine | Admitting: Family Medicine

## 2017-04-10 DIAGNOSIS — M85852 Other specified disorders of bone density and structure, left thigh: Secondary | ICD-10-CM | POA: Diagnosis not present

## 2017-04-10 DIAGNOSIS — Z780 Asymptomatic menopausal state: Secondary | ICD-10-CM | POA: Diagnosis not present

## 2017-04-10 DIAGNOSIS — Z1231 Encounter for screening mammogram for malignant neoplasm of breast: Secondary | ICD-10-CM | POA: Diagnosis not present

## 2017-04-10 DIAGNOSIS — E2839 Other primary ovarian failure: Secondary | ICD-10-CM

## 2017-04-15 ENCOUNTER — Telehealth: Payer: Self-pay | Admitting: *Deleted

## 2017-04-15 NOTE — Telephone Encounter (Signed)
Patient left message on nurse line requesting results of mammo and dexa scan. Results relayed to patient. Hubbard Hartshorn, RN, BSN

## 2017-04-16 DIAGNOSIS — Z79899 Other long term (current) drug therapy: Secondary | ICD-10-CM | POA: Diagnosis not present

## 2017-04-16 DIAGNOSIS — G894 Chronic pain syndrome: Secondary | ICD-10-CM | POA: Diagnosis not present

## 2017-04-16 DIAGNOSIS — M5136 Other intervertebral disc degeneration, lumbar region: Secondary | ICD-10-CM | POA: Diagnosis not present

## 2017-04-16 DIAGNOSIS — M797 Fibromyalgia: Secondary | ICD-10-CM | POA: Diagnosis not present

## 2017-04-16 DIAGNOSIS — M5032 Other cervical disc degeneration, mid-cervical region, unspecified level: Secondary | ICD-10-CM | POA: Diagnosis not present

## 2017-04-16 DIAGNOSIS — Z79891 Long term (current) use of opiate analgesic: Secondary | ICD-10-CM | POA: Diagnosis not present

## 2017-04-17 ENCOUNTER — Ambulatory Visit: Payer: Medicare Other | Admitting: "Endocrinology

## 2017-04-28 ENCOUNTER — Other Ambulatory Visit: Payer: Self-pay

## 2017-04-28 MED ORDER — BASAGLAR KWIKPEN 100 UNIT/ML ~~LOC~~ SOPN: 40.0000 [IU] | PEN_INJECTOR | SUBCUTANEOUS | 2 refills | Status: DC

## 2017-04-28 MED ORDER — INSULIN LISPRO 100 UNIT/ML (KWIKPEN): 10.0000 [IU] | PEN_INJECTOR | Freq: Three times a day (TID) | SUBCUTANEOUS | 2 refills | Status: DC

## 2017-04-28 MED ORDER — METFORMIN HCL ER 500 MG PO TB24: 500.0000 mg | ORAL_TABLET | Freq: Two times a day (BID) | ORAL | 2 refills | Status: DC
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
## 2017-04-30 ENCOUNTER — Ambulatory Visit: Payer: Medicare Other | Admitting: Internal Medicine

## 2017-04-30 ENCOUNTER — Telehealth: Payer: Self-pay | Admitting: "Endocrinology

## 2017-04-30 ENCOUNTER — Other Ambulatory Visit: Payer: Self-pay

## 2017-04-30 MED ORDER — GLUCOSE BLOOD VI STRP: ORAL_STRIP | 5 refills | Status: DC

## 2017-04-30 MED ORDER — FREESTYLE LIBRE SENSOR SYSTEM MISC: 2 refills | Status: DC

## 2017-04-30 NOTE — Telephone Encounter (Signed)
Prodigy test strips sent to [facility] per pt.

## 2017-04-30 NOTE — Telephone Encounter (Signed)
Denise Freeman is stating she needs the nurse to call her back asap in regards to her glucose meter and test strips, please advise?

## 2017-05-01 ENCOUNTER — Encounter (HOSPITAL_COMMUNITY): Payer: Self-pay | Admitting: Psychiatry

## 2017-05-01 ENCOUNTER — Ambulatory Visit (INDEPENDENT_AMBULATORY_CARE_PROVIDER_SITE_OTHER): Payer: Medicare Other | Admitting: Psychiatry

## 2017-05-01 VITALS — BP 124/70 | HR 78 | Ht 59.0 in | Wt 186.0 lb

## 2017-05-01 DIAGNOSIS — J449 Chronic obstructive pulmonary disease, unspecified: Secondary | ICD-10-CM

## 2017-05-01 DIAGNOSIS — E1151 Type 2 diabetes mellitus with diabetic peripheral angiopathy without gangrene: Secondary | ICD-10-CM | POA: Diagnosis not present

## 2017-05-01 DIAGNOSIS — G4733 Obstructive sleep apnea (adult) (pediatric): Secondary | ICD-10-CM

## 2017-05-01 DIAGNOSIS — F0630 Mood disorder due to known physiological condition, unspecified: Secondary | ICD-10-CM | POA: Diagnosis not present

## 2017-05-01 DIAGNOSIS — Z9911 Dependence on respirator [ventilator] status: Secondary | ICD-10-CM

## 2017-05-01 DIAGNOSIS — M797 Fibromyalgia: Secondary | ICD-10-CM | POA: Diagnosis not present

## 2017-05-01 DIAGNOSIS — Z6837 Body mass index (BMI) 37.0-37.9, adult: Secondary | ICD-10-CM

## 2017-05-01 DIAGNOSIS — Z87891 Personal history of nicotine dependence: Secondary | ICD-10-CM | POA: Diagnosis not present

## 2017-05-01 DIAGNOSIS — Z794 Long term (current) use of insulin: Secondary | ICD-10-CM

## 2017-05-01 DIAGNOSIS — Z818 Family history of other mental and behavioral disorders: Secondary | ICD-10-CM | POA: Diagnosis not present

## 2017-05-01 DIAGNOSIS — I482 Chronic atrial fibrillation: Secondary | ICD-10-CM | POA: Diagnosis not present

## 2017-05-01 DIAGNOSIS — Z79899 Other long term (current) drug therapy: Secondary | ICD-10-CM | POA: Diagnosis not present

## 2017-05-01 DIAGNOSIS — R413 Other amnesia: Secondary | ICD-10-CM

## 2017-05-01 DIAGNOSIS — E1129 Type 2 diabetes mellitus with other diabetic kidney complication: Secondary | ICD-10-CM

## 2017-05-01 DIAGNOSIS — E669 Obesity, unspecified: Secondary | ICD-10-CM

## 2017-05-01 DIAGNOSIS — F331 Major depressive disorder, recurrent, moderate: Secondary | ICD-10-CM | POA: Diagnosis not present

## 2017-05-01 MED ORDER — QUETIAPINE FUMARATE 25 MG PO TABS: 25.0000 mg | ORAL_TABLET | Freq: Every day | ORAL | 0 refills | Status: DC

## 2017-05-01 MED ORDER — MIRTAZAPINE 15 MG PO TABS: 15.0000 mg | ORAL_TABLET | Freq: Every day | ORAL | 0 refills | Status: DC

## 2017-05-01 MED ORDER — ESCITALOPRAM OXALATE 10 MG PO TABS: 10.0000 mg | ORAL_TABLET | Freq: Every day | ORAL | 0 refills | Status: DC

## 2017-05-01 NOTE — Patient Instructions (Signed)
1. Continue lexapro 10 mg at night 2. Continue mirtazapine 15 mg at night 3. Continue quetiapine 25 mg at night 4. Return to clinic in three months for 15 mins

## 2017-05-02 ENCOUNTER — Other Ambulatory Visit: Payer: Self-pay | Admitting: Pharmacist

## 2017-05-02 ENCOUNTER — Ambulatory Visit: Payer: Self-pay | Admitting: Pharmacist

## 2017-05-02 LAB — TSH: TSH: 1.62 m[IU]/L (ref 0.40–4.50)

## 2017-05-03 NOTE — Patient Outreach (Signed)
[facility]) Care Management  05/03/2017  Denise Freeman [facility] 1951/02/14 [id number]   Patient was called to follow up on [place] and Humalog reordering.  Unfortunately, patient did not answer the phone.  [facility] was called on the patient's behalf and a refill was placed for both Basaglar and Humalog. The representative said a shipment of both medications should arrive at Dr. Chrissie Noa office in 7-10 business days.   Plan:  Follow up with the patient in 10-14 business days to be sure she has received her insulin.   [name], PharmD, [place] Clinical Pharmacist [phone]

## 2017-05-05 ENCOUNTER — Telehealth: Payer: Self-pay | Admitting: *Deleted

## 2017-05-05 NOTE — Telephone Encounter (Addendum)
Spoke with pharmacist.  Pt's coumadin ran out too early.  Per pharmacist pt has been taking 4mg  daily except 6mg  on Mondays and 2mg  on Saturdays.  She is suppose to be taking 4mg  daily except 2mg  on Saturdays.  Will hold coumadin tonight.  Start correct dose and recheck INR on 12/19.  Pharmacy will fill med.  Pharmacist call back after speaking to pt.  She misunderstood and pt has been taking coumadin correctly.  She has been breaking a whole tablet in half for Saturdays each week and not using the other halves.  She has tablets, they are just halves.  Pharm will replace on Friday and she know to use the halves she has.

## 2017-05-08 ENCOUNTER — Telehealth (HOSPITAL_COMMUNITY): Payer: Self-pay | Admitting: *Deleted

## 2017-05-08 NOTE — Telephone Encounter (Signed)
Spoke with patient gave TSH results per Dr Modesta Messing & offered to send letter. Patient stated she doesn't need a letter if it's ok.

## 2017-05-08 NOTE — Telephone Encounter (Signed)
Please inform her that it was within normal range (TSH 1.62) and no intervention is needed. If she would like to receive a letter, we can do this as well.

## 2017-05-08 NOTE — Telephone Encounter (Signed)
Dr Modesta Messing , Patient called in asking if you could give her the thyroid test results # [phone]

## 2017-05-09 ENCOUNTER — Ambulatory Visit (INDEPENDENT_AMBULATORY_CARE_PROVIDER_SITE_OTHER): Payer: Medicare Other | Admitting: "Endocrinology

## 2017-05-09 ENCOUNTER — Encounter: Payer: Self-pay | Admitting: "Endocrinology

## 2017-05-09 VITALS — BP 92/59 | HR 109 | Ht 59.0 in | Wt 186.0 lb

## 2017-05-09 DIAGNOSIS — I10 Essential (primary) hypertension: Secondary | ICD-10-CM | POA: Diagnosis not present

## 2017-05-09 DIAGNOSIS — E782 Mixed hyperlipidemia: Secondary | ICD-10-CM

## 2017-05-09 DIAGNOSIS — Z794 Long term (current) use of insulin: Secondary | ICD-10-CM

## 2017-05-09 DIAGNOSIS — E1122 Type 2 diabetes mellitus with diabetic chronic kidney disease: Secondary | ICD-10-CM

## 2017-05-09 DIAGNOSIS — N183 Chronic kidney disease, stage 3 (moderate): Secondary | ICD-10-CM | POA: Diagnosis not present

## 2017-05-09 DIAGNOSIS — E1159 Type 2 diabetes mellitus with other circulatory complications: Secondary | ICD-10-CM | POA: Diagnosis not present

## 2017-05-09 MED ORDER — BASAGLAR KWIKPEN 100 UNIT/ML ~~LOC~~ SOPN: 50.0000 [IU] | PEN_INJECTOR | Freq: Every day | SUBCUTANEOUS | 2 refills | Status: DC

## 2017-05-09 NOTE — Progress Notes (Signed)
Consult Note       05/09/2017, 1:10 PM   Subjective:    Patient ID: Denise Freeman, female    DOB: 17-Oct-1950.  she is being seen in consultation for management of currently uncontrolled symptomatic diabetes requested by  Rogue Bussing, MD.   Past Medical History:  Diagnosis Date  . Allergy   . Anemia   . Anxiety   . Asthma   . Atrial fibrillation ([place])   . Bipolar 1 disorder ([place])   . 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 Cholelithiasis    a. 09/2016 s/p Lap Chole.  . Chronic bronchitis ([place])   . Chronic combined systolic and diastolic congestive heart failure ([place])    a. 09/2016 Echo: EF 30-35%.  . Colon polyps   . COPD (chronic obstructive pulmonary disease) ([place])   . Depression   . Family history of adverse reaction to anesthesia    Uncle was positive for malignant hyperthermia; patient had testing done and was negative.  . Fibromyalgia   . GERD (gastroesophageal reflux disease)   . Gout   . High cholesterol   . History of blood transfusion 06/2015   "bleeding from my rectum"  . History of hiatal hernia   . HOH (hard of hearing)   . Hx of colonic polyps 03/21/2016   3 small adenomas no recall - co-morbidities  . Neuropathy    Disc Back   . NICM (nonischemic cardiomyopathy) ([place])    a. Previously worked up in [place], [place]-->low EF with subsequent recovery.  Multiple caths (last ~ 2014 per pt report)--reportedly nl cors;  b. 09/2016 Echo: EF 30-35%, antsept/apical HK, mild MR, mildly dil LA, mod dil RA;  c. 09/2016 Lexi MV: EF 26%, glob HK, sept DK, med size, mod intensity fixed septal defect - BBB/PVC related artifact, no ischemia.  . On home oxygen therapy    "3L; 24/7" (03/14/2016)  . OSA treated with BiPAP    uses biPAP, 10 (03/14/2016)  . Osteoarthritis   . Oxygen deficiency   . Pneumonia   . Type II diabetes mellitus ([place])    Past Surgical History:   Procedure Laterality Date  . APPENDECTOMY     "they busted"  . bladder stimulator     pt states, "it cannot be turned off; it's in my right hip; dead battery so it's not working anymore". (03/14/2016)  . BLADDER SUSPENSION     2003, 2006 and 2010  . CATARACT EXTRACTION W/PHACO Right 11/29/2014   Procedure: CATARACT EXTRACTION PHACO AND INTRAOCULAR LENS PLACEMENT (IOC);  Surgeon: Rutherford Guys, MD;  Location: [facility];  Service: Ophthalmology;  Laterality: Right;  CDE:3.81  . CATARACT EXTRACTION W/PHACO Left 12/13/2014   Procedure: CATARACT EXTRACTION PHACO AND INTRAOCULAR LENS PLACEMENT (IOC);  Surgeon: Rutherford Guys, MD;  Location: [facility];  Service: Ophthalmology;  Laterality: Left;  CDE:6.59  . CERVICAL DISC SURGERY N/A 2009   4, 6, and 7 cervical disc replaced  . CHOLECYSTECTOMY N/A 09/13/2016   Procedure: LAPAROSCOPIC CHOLECYSTECTOMY;  Surgeon: Rolm Bookbinder, MD;  Location: [facility];  Service: General;  Laterality: N/A;  . COLONOSCOPY  WITH PROPOFOL N/A 03/15/2016   Procedure: COLONOSCOPY WITH PROPOFOL;  Surgeon: Gatha Mayer, MD;  Location: [facility];  Service: Endoscopy;  Laterality: N/A;  . ESOPHAGOGASTRODUODENOSCOPY (EGD) WITH PROPOFOL N/A 03/15/2016   Procedure: ESOPHAGOGASTRODUODENOSCOPY (EGD) WITH PROPOFOL;  Surgeon: Gatha Mayer, MD;  Location: [facility];  Service: Endoscopy;  Laterality: N/A;  . HEEL SPUR SURGERY Bilateral   . HERNIA REPAIR    . I&D EXTREMITY Right 06/13/2015   Procedure: MINOR IRRIGATION AND DEBRIDEMENT EXTREMITY REMOVAL OF NAIL;  Surgeon: Daryll Brod, MD;  Location: [facility];  Service: Orthopedics;  Laterality: Right;  . TUBAL LIGATION    . UMBILICAL HERNIA REPAIR     w/mesh   Social History   Socioeconomic History  . Marital status: Married    Spouse name: None  . Number of children: 1  . Years of education: None  . Highest education level: None  Social Needs  . Financial resource strain: None  . Food insecurity - worry: None   . Food insecurity - inability: None  . Transportation needs - medical: None  . Transportation needs - non-medical: None  Occupational History  . Occupation: house wife  Tobacco Use  . Smoking status: Former Smoker    Packs/day: 2.00    Years: 14.00    Pack years: 28.00    Types: Cigarettes    Last attempt to quit: 04/12/1989    Years since quitting: 28.0  . Smokeless tobacco: Never Used  Substance and Sexual Activity  . Alcohol use: No    Alcohol/week: 0.0 oz  . Drug use: No  . Sexual activity: No  Other Topics Concern  . None  Social History Narrative   Married   Disabled   1 child; 56 yrs   6 pregnancies   1 child   Outpatient Encounter Medications as of 05/09/2017  Medication Sig  . albuterol (PROVENTIL) (2.5 MG/3ML) 0.083% nebulizer solution Take 3 mLs (2.5 mg total) by nebulization every 6 (six) hours as needed for wheezing or shortness of breath.  Marland Kitchen atorvastatin (LIPITOR) 40 MG tablet Take 1 tablet (40 mg total) by mouth daily.  . cetirizine (ZYRTEC) 10 MG tablet Take 1 tablet (10 mg total) by mouth at bedtime.  . cholecalciferol (VITAMIN D) 400 units TABS tablet Take 1 tablet (400 Units total) by mouth 2 (two) times daily.  . Continuous Blood Gluc Sensor (FREESTYLE LIBRE SENSOR SYSTEM) MISC Use one sensor every 10 days.  Marland Kitchen docusate sodium (COLACE) 100 MG capsule Take 100 mg by mouth daily.  Marland Kitchen escitalopram (LEXAPRO) 10 MG tablet Take 1 tablet (10 mg total) by mouth at bedtime.  . fenofibrate 54 MG tablet Take 1 tablet (54 mg total) by mouth daily.  . ferrous sulfate 324 (65 Fe) MG TBEC Take 1 tablet by mouth daily.  . furosemide (LASIX) 40 MG tablet Take 2 tablets (80 mg total) by mouth 2 (two) times daily.  Marland Kitchen glucose blood (PRODIGY NO CODING BLOOD GLUC) test strip Use as instructed 4 x daily. E11.65  . Insulin Glargine (BASAGLAR KWIKPEN) 100 UNIT/ML SOPN Inject 0.5 mLs (50 Units total) into the skin daily at 10 pm.  . insulin lispro (HUMALOG KWIKPEN) 100 UNIT/ML  KiwkPen Inject 0.1-0.16 mLs (10-16 Units total) into the skin 3 (three) times daily before meals.  . Insulin Pen Needle 32G X 4 MM MISC Use to inject insulin 5 times daily  . ipratropium (ATROVENT) 0.02 % nebulizer solution Take 2.5 mLs (0.5 mg total) by nebulization  every 6 (six) hours as needed for wheezing or shortness of breath.  . losartan (COZAAR) 25 MG tablet Take 1 tablet (25 mg total) by mouth daily.  . metFORMIN (GLUCOPHAGE-XR) 500 MG 24 hr tablet Take 1 tablet (500 mg total) by mouth 2 (two) times daily.  . metolazone (ZAROXOLYN) 2.5 MG tablet Take 1 tablet (2.5 mg total) by mouth daily.  . metoprolol succinate (TOPROL-XL) 25 MG 24 hr tablet Take 1 tablet (25 mg total) by mouth 2 (two) times daily.  . mirtazapine (REMERON) 15 MG tablet Take 1 tablet (15 mg total) by mouth at bedtime.  . modafinil (PROVIGIL) 200 MG tablet Take 1 tablet (200 mg total) by mouth daily.  . mometasone (NASONEX) 50 MCG/ACT nasal spray Place 1 spray into the nose daily.  . montelukast (SINGULAIR) 10 MG tablet Take 1 tablet (10 mg total) by mouth at bedtime.  . nitroGLYCERIN (NITROSTAT) 0.4 MG SL tablet Place 1 tablet (0.4 mg total) under the tongue every 5 (five) minutes as needed for chest pain.  Marland Kitchen nystatin (NYSTATIN) powder Apply topically 2 (two) times daily. Apply under breasts  . ondansetron (ZOFRAN-ODT) 4 MG disintegrating tablet Take 1 tablet (4 mg total) by mouth 2 (two) times daily as needed for nausea or vomiting.  Marland Kitchen oxyCODONE-acetaminophen (PERCOCET/ROXICET) 5-325 MG tablet Take 1 tablet by mouth every 8 (eight) hours as needed for severe pain.  . OXYGEN Inhale 3 L into the lungs continuous. 3 liters 24/7  . pantoprazole (PROTONIX) 20 MG tablet Take 1 tablet (20 mg total) by mouth 2 (two) times daily before a meal.  . PRESCRIPTION MEDICATION Inhale into the lungs See admin instructions. Use BIPAP every time laying down  . primidone (MYSOLINE) 50 MG tablet Take 1 tablet (50 mg total) by mouth at  bedtime.  . Probiotic Product (PROBIOTIC PO) Take 1 tablet by mouth at bedtime.  Marland Kitchen QUEtiapine (SEROQUEL) 25 MG tablet Take 1 tablet (25 mg total) by mouth at bedtime.  . triamcinolone lotion (KENALOG) 0.1 % Apply 1 application topically 3 (three) times daily. For up to 14 days and as needed  . vitamin A 10000 UNIT capsule Take 10,000 Units by mouth every morning.  . warfarin (COUMADIN) 4 MG tablet TAKE 1 TABLET BY MOUTH DAILY EXCEPT 1/2 TABLET ON SATURDAYS  . [DISCONTINUED] Insulin Glargine (BASAGLAR KWIKPEN) 100 UNIT/ML SOPN Inject 0.4 mLs (40 Units total) into the skin every morning.   No facility-administered encounter medications on file as of 05/09/2017.     ALLERGIES: Allergies  Allergen Reactions  . Xarelto [Rivaroxaban] Other (See Comments)    Internal bleeding  . Ancef [Cefazolin] Nausea And Vomiting  . Levaquin [Levofloxacin In D5w] Other (See Comments)    "afib"  . Lyrica [Pregabalin] Hives  . Tamiflu [Oseltamivir Phosphate] Other (See Comments)    "water blisters"  . Zoloft [Sertraline Hcl] Other (See Comments)    Jaw problems, jittery  . Augmentin [Amoxicillin-Pot Clavulanate] Itching  . Ciprofloxacin Itching and Nausea And Vomiting  . Haldol [Haloperidol] Other (See Comments)    Restless leg  . Nsaids Diarrhea  . Penicillins Itching, Nausea And Vomiting and Rash    Has patient had a PCN reaction causing immediate rash, facial/tongue/throat swelling, SOB or lightheadedness with hypotension: Yes Has patient had a PCN reaction causing severe rash involving mucus membranes or skin necrosis: No Has patient had a PCN reaction that required hospitalization No Has patient had a PCN reaction occurring within the last 10 years: Yes If all of  the above answers are "NO", then may proceed with Cephalosporin use.  . Topamax [Topiramate] Nausea Only    VACCINATION STATUS: Immunization History  Administered Date(s) Administered  . Influenza Split 03/03/2014  .  Influenza,inj,Quad PF,6+ Mos 01/30/2016, 01/27/2017  . Influenza-Unspecified 02/06/2015  . Pneumococcal Conjugate-13 01/30/2016  . Pneumococcal Polysaccharide-23 11/24/2016  . Tetanus 06/03/2013    Diabetes  She presents for her follow-up diabetic visit. She has type 2 diabetes mellitus. Onset time: She was diagnosed at approximate age of 86 years. Her disease course has been improving. There are no hypoglycemic associated symptoms. Pertinent negatives for hypoglycemia include no confusion, headaches, pallor or seizures. Associated symptoms include fatigue, polydipsia, polyphagia and visual change. Pertinent negatives for diabetes include no chest pain and no polyuria. There are no hypoglycemic complications. Symptoms are improving. Diabetic complications include heart disease, nephropathy, peripheral neuropathy and retinopathy. Risk factors for coronary artery disease include family history, dyslipidemia, diabetes mellitus, hypertension, obesity, post-menopausal, sedentary lifestyle and tobacco exposure. Current diabetic treatment includes insulin injections and oral agent (monotherapy) (She is currently on metformin 500 mg by mouth twice a day, Basaglar 40 units every bedtime , Humalog 10 units 3 times a day before meals.). Her weight is decreasing steadily. She is following a generally unhealthy diet. When asked about meal planning, she reported none. She has not had a previous visit with a dietitian. She never participates in exercise. Her breakfast blood glucose range is generally 180-200 mg/dl. Her lunch blood glucose range is generally 180-200 mg/dl. Her dinner blood glucose range is generally 180-200 mg/dl. Her bedtime blood glucose range is generally 180-200 mg/dl. Her overall blood glucose range is 180-200 mg/dl. An ACE inhibitor/angiotensin II receptor blocker is being taken. She does not see a podiatrist.Eye exam is current (She is legally blind on both eyes reportedly unrelated to her  diabetes.).  Hyperlipidemia  This is a chronic problem. The current episode started more than 1 year ago. The problem is controlled. Exacerbating diseases include diabetes, hypothyroidism and obesity. Pertinent negatives include no chest pain, myalgias or shortness of breath. Current antihyperlipidemic treatment includes statins and fibric acid derivatives. Risk factors for coronary artery disease include diabetes mellitus, dyslipidemia, family history, hypertension, obesity, a sedentary lifestyle and post-menopausal.  Hypertension  This is a chronic problem. The current episode started more than 1 year ago. Pertinent negatives include no chest pain, headaches, palpitations or shortness of breath. Risk factors for coronary artery disease include dyslipidemia, diabetes mellitus, obesity, sedentary lifestyle and smoking/tobacco exposure. Past treatments include angiotensin blockers and diuretics. Hypertensive end-organ damage includes retinopathy.     Review of Systems  Constitutional: Positive for fatigue. Negative for chills, fever and unexpected weight change.  HENT: Negative for trouble swallowing and voice change.   Eyes: Negative for visual disturbance.  Respiratory: Negative for cough, shortness of breath and wheezing.   Cardiovascular: Negative for chest pain, palpitations and leg swelling.  Gastrointestinal: Negative for diarrhea, nausea and vomiting.  Endocrine: Positive for polydipsia and polyphagia. Negative for cold intolerance, heat intolerance and polyuria.  Musculoskeletal: Positive for gait problem. Negative for arthralgias and myalgias.       Patient uses a specialized walker due to deconditioning, disequilibrium, blindness.  Skin: Negative for color change, pallor, rash and wound.  Neurological: Negative for seizures and headaches.  Psychiatric/Behavioral: Negative for confusion and suicidal ideas.    Objective:    BP (!) 92/59   Pulse (!) 109   Ht 4\' 11"  (1.499 m)   Wt  186 lb (  84.4 kg)   BMI 37.57 kg/m   Wt Readings from Last 3 Encounters:  05/09/17 186 lb (84.4 kg)  04/01/17 193 lb (87.5 kg)  03/12/17 197 lb (89.4 kg)     Physical Exam  Constitutional: She is oriented to person, place, and time. She appears well-developed.  HENT:  Head: Normocephalic and atraumatic.  Eyes: EOM are normal.  Neck: Normal range of motion. Neck supple. No tracheal deviation present. No thyromegaly present.  Cardiovascular: Normal rate and regular rhythm.  Pulmonary/Chest: Effort normal and breath sounds normal.  Abdominal: Soft. Bowel sounds are normal. There is no tenderness. There is no guarding.  Musculoskeletal: She exhibits no edema.  She uses a specialized walker to ambulate due to deconditioning, disequilibrium, blindness.  Neurological: She is alert and oriented to person, place, and time. She has normal reflexes. No cranial nerve deficit. Coordination normal.  Hard of hearing.  Skin: Skin is warm and dry. No rash noted. No erythema. No pallor.  Psychiatric: She has a normal mood and affect. Judgment normal.    CMP     Component Value Date/Time   NA 139 04/01/2017 1135   NA 142 01/08/2017 1145   K 4.0 04/01/2017 1135   CL 90 (L) 04/01/2017 1135   CO2 38 (H) 04/01/2017 1135   GLUCOSE 174 (H) 04/01/2017 1135   BUN 49 (H) 04/01/2017 1135   BUN 37 (H) 01/08/2017 1145   CREATININE 1.25 (H) 04/01/2017 1135   CALCIUM 8.9 04/01/2017 1135   PROT 7.3 04/01/2017 1135   ALBUMIN 3.1 (L) 09/14/2016 0450   AST 15 04/01/2017 1135   ALT 8 04/01/2017 1135   ALKPHOS 74 09/14/2016 0450   BILITOT 0.4 04/01/2017 1135   GFRNONAA 45 (L) 04/01/2017 1135   GFRAA 52 (L) 04/01/2017 1135     Diabetic Labs (most recent): Lab Results  Component Value Date   HGBA1C 8.3 (H) 04/01/2017   HGBA1C 7.9 01/27/2017   HGBA1C 8.1 09/27/2016     Lipid Panel ( most recent) Lipid Panel     Component Value Date/Time   CHOL 148 09/10/2016 1147   TRIG 150 (H) 09/10/2016 1147    HDL 24 (L) 09/10/2016 1147   CHOLHDL 6.2 09/10/2016 1147   VLDL 30 09/10/2016 1147   LDLCALC 94 09/10/2016 1147   LDLDIRECT 153.0 04/19/2016 0932      Assessment & Plan:   1. DM type 2 causing vascular disease , Stage 3 renal insufficiency, blindness - Patient has currently uncontrolled symptomatic type 2 DM since  66 years of age. - She came with more consistent, slightly above target blood glucose readings. More importantly, she did not have any hypoglycemia. -Her A1c is slightly higher at 8.3%.  -her diabetes is complicated by coronary artery disease, exercise information, stage 3 renal insufficiency, blindness and Denise Freeman remains at a high risk for more acute and chronic complications which include CAD, CVA, CKD, retinopathy, and neuropathy. These are all discussed in detail with the patient.  - I have counseled her on diet management and weight loss, by adopting a carbohydrate restricted/protein rich diet.  -  Suggestion is made for her to avoid simple carbohydrates  from her diet including Cakes, Sweet Desserts / Pastries, Ice Cream, Soda (diet and regular), Sweet Tea, Candies, Chips, Cookies, Store Bought Juices, Alcohol in Excess of  1-2 drinks a day, Artificial Sweeteners, and "Sugar-free" Products. This will help patient to have stable blood glucose profile and potentially avoid unintended weight gain.   -  I encouraged her to switch to  unprocessed or minimally processed complex starch and increased protein intake (animal or plant source), fruits, and vegetables.  - she is advised to stick to a routine mealtimes to eat 3 meals  a day and avoid unnecessary snacks ( to snack only to correct hypoglycemia).   - she will be scheduled with Jearld Fenton, RDN, CDE for individualized diabetes education.  - I have approached her with the following individualized plan to manage diabetes and patient agrees:   - Given her blindness, it is very difficult to give her intensive  insulin therapy. Her husband , who is already doing everything for her, is offering to help . - The #1 priority in her diabetes treatment  would be to avoid hypoglycemia.   - I  will  increase her basal insulin  basal insulin Basaglar to 50 units daily at bedtime, continue prandial insulin Humalog  10 units 3 times a day before meals for pre-meal blood glucose above 90 mg/dL . - She is asked to start strict monitoring of glucose 4 times a day-before meals and at bedtime, and bring her meter and logs with her for reevaluation. - Patient is warned not to take insulin without proper monitoring per orders. -Adjustment parameters are given for hypo and hyperglycemia in writing. -Patient is encouraged to call clinic for blood glucose levels less than 70 or above 300 mg /dl. - She will benefit from continued glucose monitoring. I discussed and initiated a prescription for the [facility] device for her. - I will continue  low-dose metformin 500 mg by mouth twice a day, therapeutically suitable for patient .  -Patient is not a candidate for  SGLT2 inhibitors due to CKD.  - Patient specific target  A1c;  LDL, HDL, Triglycerides, and  Waist Circumference were discussed in detail.  2) BP/HTN: Below target, she was approached for a lower dose of Lasix which she takes currently at 80 units twice a day. She refused to change the dose claiming that she will develop edema quickly. She is also low-dose losartan in metoprolol .  3) Lipids/HPL:   Controlled.   Patient is advised to continue statins. 4)  Weight/Diet: CDE Consult will be initiated , exercise, and detailed carbohydrates information provided.  5) Chronic Care/Health Maintenance:  -she  is on ACEI/ARB and Statin medications and  is encouraged to continue to follow up with Ophthalmology, Dentist,  Podiatrist at least yearly or according to recommendations, and advised to  stay away from smoking. I have recommended yearly flu vaccine and pneumonia  vaccination at least every 5 years; and  sleep for at least 7 hours a day. - Time spent with the patient: 25 min, of which >50% was spent in reviewing her sugar logs , discussing her hypo- and hyper-glycemic episodes, reviewing her current and  previous labs and insulin doses and developing a plan to avoid hypo- and hyper-glycemia.   - I advised patient to maintain close follow up with Rogue Bussing, MD for primary care needs.  Follow up plan: - Return in about 3 months (around 08/07/2017) for follow up with pre-visit labs, meter, and logs.  [place]ade Lloyd, MD [facility] [address] [place], [place] [address] Phone: [phone]  Fax: [phone]    05/09/2017, 1:10 PM  This note was partially dictated with voice recognition software. Similar sounding words can be transcribed inadequately or may not  be corrected upon review.

## 2017-05-09 NOTE — Patient Instructions (Signed)

## 2017-05-14 DIAGNOSIS — G894 Chronic pain syndrome: Secondary | ICD-10-CM | POA: Diagnosis not present

## 2017-05-14 DIAGNOSIS — M5032 Other cervical disc degeneration, mid-cervical region, unspecified level: Secondary | ICD-10-CM | POA: Diagnosis not present

## 2017-05-14 DIAGNOSIS — M5136 Other intervertebral disc degeneration, lumbar region: Secondary | ICD-10-CM | POA: Diagnosis not present

## 2017-05-15 ENCOUNTER — Telehealth: Payer: Self-pay | Admitting: *Deleted

## 2017-05-15 NOTE — Patient Outreach (Signed)
[place] [facility]) Care Management  05/15/2017  Suellen Durocher [facility] September 10, 1950 [id number]   Care coordination   THN CM received a call from Patient .She left a voice message to provide an update to CM  CM reviewed EPIC to confirm Pt has established care successfully with new endocrinologist and mental health providers and has been attending appointments as ordered Her 05/09/17 HgA1c =8.3   Return a call to Mrs Geis and states she is doing well.  She is attempting to get a Libre new meter so she will not have to stick her finger from her new endocrinologist, Nida.  Having difficulty with waiving a 20% cost for [place]. She reports her fingers are cracking and sore.  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, back pain, and health concerns  Plans She has met her [facility] CM goals except for decreasing her HgA1c and home management of diabetes Referral to [place] coach [facility] pharmacy staff of referral to [facility] health coach  [facility] CM Care Plan Problem One     Most Recent Value  Care Plan Problem One  CHF home management deficit   Role Documenting the Problem One  Care Management Coordinator  Care Plan for Problem One  Active  [facility] Long Term Goal   over the next 45 days the patient will be able to verbalize home care management interventions for CHF  [facility] Long Term Goal Start Date  09/18/16  [facility] Long Term Goal Met Date  11/07/16  Interventions for Problem One Long Term Goal  Review CHF diagnosis, CHF zones, medications, diet and activity  THN CM Short Term Goal #1   over the next 7 days the patietn will receive her portable oxygen and tubing from APS  THN CM Short Term Goal #1 Start Date  09/18/16  [facility] CM Short Term Goal #1 Met Date  09/23/16  Interventions for Short Term Goal #1  contact APS to request service for DME  [facility] CM Short Term Goal #2   Over the next 7-14 days the patient will have decreased CHF symptoms by evidence of decreased lower extremity  edema and decreased soc  THN CM Short Term Goal #2 Start Date  09/18/16  [facility] CM Short Term Goal #2 Met Date  09/23/16  Interventions for Short Term Goal #2  Assessment of CHF symptoms, call to CV office to report abnormal symptoms and request medcation treatment and hospital folllow up appointments, monitor and evaluation treatment plan   THN CM Short Term Goal #3  over the next 30 days the patient will be seen by [facility] pharmacy staff to review pharmacy discrepancies and offer resolutions  [facility] CM Short Term Goal #3 Start Date  09/23/16  [facility] CM Short Term Goal #3 Met Date  10/25/16  Interventions for Short Tern Goal #3  referral to [place], review of medications, offer new pill box, monitor and evaluation pharmacy interventions    [facility] CM Care Plan Problem Two     Most Recent Value  Care Plan Problem Two  Elevated HgA1c  Role Documenting the Problem Two  Care Management [place] for Problem Two  Active  Interventions for Problem Two Long Term Goal   Re reviewed recommended A1c, DM diet, decreasing stress, home management of DM monitoring, Referral to [place] Term Goal  over the next 60 days patient will work with new endocrinologist to decrease her HgA1c  THN Long Term Goal  Start Date  05/15/17  THN CM Short Term Goal #1   over the next 30 days she will get assist from her endocrinologist to get a new glucometer  THN CM Short Term Goal #1 Start Date  05/15/17  Interventions for Short Term Goal #2   Discussed [employer] Lavina Hamman, RN, BSN, [place] Care Management [phone]

## 2017-05-16 ENCOUNTER — Telehealth: Payer: Self-pay

## 2017-05-16 ENCOUNTER — Other Ambulatory Visit: Payer: Self-pay | Admitting: Pharmacist

## 2017-05-16 NOTE — Patient Outreach (Signed)
[name]  05/16/2017  Denise Freeman [facility] 1951-05-05 [id number]  Patient was called to follow up on medication assistance. HIPAA identifiers were obtained. Patient confirmed she received her Basaglar and Humalog from [place] by picking it up from Dr. Chrissie Noa office. Patient also shared that she is now seeing Dr. Dorris Fetch as her endocrinologist.  Plan:  I will follow up with the patient in March o 2019 to see how her prescription expenses are going as the insulin patient assistance programs require patients to spend >$1000.  [name], PharmD, [place] Clinical Pharmacist [phone]

## 2017-05-16 NOTE — Telephone Encounter (Signed)
Pt is aware that her patient assistance has arrived for Basaglar and Humalog. She will come next week to pick them up

## 2017-05-20 ENCOUNTER — Other Ambulatory Visit: Payer: Self-pay

## 2017-05-20 NOTE — Patient Outreach (Signed)
[place] [facility]) Care Management  05/20/2017  Pamella Samons [facility] Aug 24, 1950 [id number]   Telephone call placed to the patient for Introduction.  HIPAA verified.  Spoke with the patient and introduced myself.  RN HC explained to the patient what our next call will entail.  Patient verbalized understanding.  RN [facility] set up an appointment with the patient to do initial assessment.  Plan:  RN [facility] will make an outreach attempt to the patient in the month of December.   [name] RN, BSN, [place] Direct Dial:  [phone] Fax: [phone]

## 2017-05-21 ENCOUNTER — Ambulatory Visit (INDEPENDENT_AMBULATORY_CARE_PROVIDER_SITE_OTHER): Payer: Medicare Other | Admitting: *Deleted

## 2017-05-21 DIAGNOSIS — Z5181 Encounter for therapeutic drug level monitoring: Secondary | ICD-10-CM | POA: Diagnosis not present

## 2017-05-21 DIAGNOSIS — I480 Paroxysmal atrial fibrillation: Secondary | ICD-10-CM | POA: Diagnosis not present

## 2017-05-21 LAB — POCT INR: INR: 3.5

## 2017-05-28 ENCOUNTER — Telehealth: Payer: Self-pay | Admitting: Internal Medicine

## 2017-05-28 ENCOUNTER — Other Ambulatory Visit: Payer: Self-pay

## 2017-05-28 NOTE — Patient Outreach (Signed)
[facility]) Care Management  05/28/2017  Denise Freeman [facility] 08-15-1950 [id number]   1st telephone call placed to the patient for an initial assessment. No answer. HIPAA compliant voicemail left with contact information.  Plan:  RN Health Coach will contact the patient in the month of January.  [name] RN, BSN, [place] Direct Dial:  [phone] Fax: [phone]

## 2017-05-28 NOTE — Telephone Encounter (Signed)
Pt is calling and would like to speak to her doctor about an issue that she didn't want to discuss with me. jw
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
## 2017-05-30 DIAGNOSIS — H43821 Vitreomacular adhesion, right eye: Secondary | ICD-10-CM | POA: Diagnosis not present

## 2017-05-30 DIAGNOSIS — E119 Type 2 diabetes mellitus without complications: Secondary | ICD-10-CM | POA: Diagnosis not present

## 2017-05-30 DIAGNOSIS — H5316 Psychophysical visual disturbances: Secondary | ICD-10-CM | POA: Diagnosis not present

## 2017-05-30 DIAGNOSIS — H43822 Vitreomacular adhesion, left eye: Secondary | ICD-10-CM | POA: Diagnosis not present

## 2017-05-30 DIAGNOSIS — H53459 Other localized visual field defect, unspecified eye: Secondary | ICD-10-CM | POA: Diagnosis not present

## 2017-06-02 ENCOUNTER — Telehealth: Payer: Self-pay | Admitting: Internal Medicine

## 2017-06-02 NOTE — Telephone Encounter (Signed)
New message  Patient calling with concerns of SOB, feet swollen for 2 days. Please call  Pt c/o swelling: STAT is pt has developed SOB within 24 hours  1) How much weight have you gained and in what time span? 7lbs  2) If swelling, where is the swelling located? FEET  3) Are you currently taking a fluid pill? YES  4) Are you currently SOB? YES  5) Do you have a log of your daily weights (if so, list)? 191, 189, 182  6) Have you gained 3 pounds in a day or 5 pounds in a week? yes  7) Have you traveled recently? NO

## 2017-06-02 NOTE — Telephone Encounter (Signed)
Spoke to patient.  she states she is short of breath with movement ,like going to bathroom.   Weight per patient  06/02/17-today -- 191 ; 06/01/17 -- 189  , 05/31/17 -- 182. Last  Weight on 05/09/17 at  Endocrinology office visit - 186 lbs  Patient states she is taking  2 (40 mg)= total of 80 mg furosemide tablets twice a day , and 2.5 mg of metolazone at night with  80 mg furosemide. Patient states she takes the night dose of both medication all at the same time because it comes prepackage from the pharmacy.  aware will defer to Dr Debara Pickett.

## 2017-06-03 NOTE — Telephone Encounter (Signed)
Can increase metolazone to 5 mg daily for 3 days or until weight returns to normal, then decrease back to 2.5 mg daily. Continue furosemide 80 mg BID.  Pixie Casino, MD, [facility], [place] Director of the Advanced Lipid Disorders &  Cardiovascular Risk Reduction Clinic Attending Cardiologist  Direct Dial: [phone]  Fax: [phone]  Website:  [email or web address]

## 2017-06-04 ENCOUNTER — Telehealth: Payer: Self-pay | Admitting: Internal Medicine

## 2017-06-04 NOTE — Telephone Encounter (Signed)
Received page from patient. Weight has been increasing over the past 1 to 2 weeks. Feels more swollen like she's retaining fluid. In the past she has taken an extra dose of lasix when she feels this way and her weight improves. Starting to feel somewhat short of breath now as well. Taking 80mg  BID and 2.5mg  metolazone in the PM. Advised patient to take an extra dose of 80mg  once daily (for a total of 80mg  TID) for 2 to 3 days. If her weight and fluid retention does not improve she will call back. She is agreeable to this plan. Understands that she should come to ED if her symptoms worsen despite extra lasix dose.   Marcie Mowers, MD Cardiology Fellow, PGY-5

## 2017-06-04 NOTE — Telephone Encounter (Signed)
LMTCB - per [name] - add on to schedule (openings at 3p & 4p) at time of call.   See phone note from cardiology fellow regarding diuretic adjustments

## 2017-06-04 NOTE — Telephone Encounter (Signed)
Called patient who reports improvements in her symptoms (swelling, weight). She was 191lbs on 12/31 when she called in to triage and is 186lbs today. She took extra lasix as directed per cardiology fellow and I reiterated that she should take lasix 80mg  TID for 2-3 days and she should call on Friday with an update. She was offered an MD OV today but she cannot make an appt today. Per [name] will need sooner follow up than April 2019. Will wait on call from patient with update and schedule appt at that time.

## 2017-06-06 ENCOUNTER — Telehealth: Payer: Self-pay | Admitting: Cardiology

## 2017-06-06 ENCOUNTER — Telehealth: Payer: Self-pay | Admitting: Internal Medicine

## 2017-06-06 ENCOUNTER — Other Ambulatory Visit: Payer: Self-pay

## 2017-06-06 NOTE — Telephone Encounter (Signed)
New Message     Can patient continue taking extra lasik, she is still retaining water     Pt c/o swelling: STAT is pt has developed SOB within 24 hours  1) How much weight have you gained and in what time span? She didn't check it today , but it is down a little from yesterday  2) If swelling, where is the swelling located?  All over   3) Are you currently taking a fluid pill? yes  4) Are you currently SOB? Yes still having the sob , but it is better   Do you have a log of your daily weights (if so, list)?  no 5) Have you gained 3 pounds in a day or 5 pounds in a week?  No   6) Have you traveled recently?  no

## 2017-06-06 NOTE — Telephone Encounter (Signed)
Received call back from patient.She stated she called Dr.on call this past Wed night due to increased swelling in lower legs and feet.He advised to increase Lasix to 40 mg 2 tablets three times a day and call back today for further instructions.Stated swelling is slightly better.She wants to know if she should continue 40 mg 2 tablets three times a day until swelling is gone.Advised I will send message to Dr.Hilty for advice.

## 2017-06-06 NOTE — Patient Outreach (Signed)
[facility]) Care Management  06/06/2017  Denise Freeman [facility] 1950/06/26 [id number]   1st telephone call placed to the patient for an initial assessment. No answer. HIPAA compliant voice mail left with contact information.  Plan:  RN Health Coach will make an out reach attempt to the patient within the next three business days.  [name] RN, BSN, [place] Direct Dial:  [phone] Fax: [phone]

## 2017-06-06 NOTE — Telephone Encounter (Signed)
Returned call to patient no answer.LMTC. 

## 2017-06-06 NOTE — Telephone Encounter (Signed)
Pt called due to no call back about her lasix.  She is better on TID of 80 mg lasix but still with DOE,  She will only take 80 BID tomorrow and then back to TID Sunday.  She will need stat BMP on Monday the 7th.  She will need follow up in near future as well.  Will ask office to arrange.

## 2017-06-07 NOTE — Telephone Encounter (Signed)
This has been addressed.  Dr. Lemmie Evens

## 2017-06-09 ENCOUNTER — Other Ambulatory Visit: Payer: Self-pay | Admitting: *Deleted

## 2017-06-09 DIAGNOSIS — L989 Disorder of the skin and subcutaneous tissue, unspecified: Secondary | ICD-10-CM

## 2017-06-09 NOTE — Telephone Encounter (Signed)
Patient left message on nurse line requesting refill on mupirocin cream at [facility] in [place]. Hubbard Hartshorn, RN, BSN

## 2017-06-10 MED ORDER — MUPIROCIN CALCIUM 2 % EX CREA: 1.0000 "application " | TOPICAL_CREAM | Freq: Two times a day (BID) | CUTANEOUS | 0 refills | Status: DC

## 2017-06-11 ENCOUNTER — Other Ambulatory Visit: Payer: Self-pay

## 2017-06-11 DIAGNOSIS — M5136 Other intervertebral disc degeneration, lumbar region: Secondary | ICD-10-CM | POA: Diagnosis not present

## 2017-06-11 DIAGNOSIS — M797 Fibromyalgia: Secondary | ICD-10-CM | POA: Diagnosis not present

## 2017-06-11 DIAGNOSIS — Z4689 Encounter for fitting and adjustment of other specified devices: Secondary | ICD-10-CM | POA: Diagnosis not present

## 2017-06-11 DIAGNOSIS — Z79899 Other long term (current) drug therapy: Secondary | ICD-10-CM | POA: Diagnosis not present

## 2017-06-11 DIAGNOSIS — M5032 Other cervical disc degeneration, mid-cervical region, unspecified level: Secondary | ICD-10-CM | POA: Diagnosis not present

## 2017-06-11 DIAGNOSIS — G894 Chronic pain syndrome: Secondary | ICD-10-CM | POA: Diagnosis not present

## 2017-06-11 NOTE — Telephone Encounter (Signed)
Patient scheduled for MD OV Jun 13, 2017

## 2017-06-11 NOTE — Telephone Encounter (Signed)
Per MD, patient should follow up sooner than April appt. LMTCB to schedule

## 2017-06-11 NOTE — Patient Outreach (Signed)
[facility]) Care Management  06/11/2017  Denise Freeman [facility] Dec 22, 1950 [id number]  3rd outreach attempt made to the patient for initial assessment. No answer. HIPAA compliant voicemail left with contact information.   Plan: RN Health Coach will send letter to attempt outreach.  If no response within ten business days will proceed with case closure.  Denise Arms RN, BSN, [place] Direct Dial:  [phone] Fax: [phone]

## 2017-06-11 NOTE — Patient Outreach (Addendum)
[facility]) Care Management  [place]  06/11/2017   Denise Freeman 12/23/1950 [id number]  Subjective: Telephone call to the patient for an initial assessment. HIPAA verified.  The patient states the she lives with her husband.  She states that she can perform her ADLS and needs assist with her IADLS.  Her husband takes her to her appointments.  The patient states that she has chronic pain at about an 8/10 but has medication for it.  She denies any recent falls. The patient states that she has a Power wheelchair, walker, CBG meter, and BiPAP machine in the home.  The patient states that she has DM type 2 , COPD, Heart failure and Sleep apnea. The patient states that she is legally blind and her husband helps her with her medications. She says that she is adherent with her medications.  She checks her blood glucose four times a day.  Her blood glucose today before lunch was 148. The patient states that she gets some exercise walking in the home and going to her doctor offices. The patient states she has an advanced directive in another state but decline any information.  The patient states that Dr Dorris Fetch monitors her diabetes and she has an appointment with him in three months.   Objective:   Encounter Medications:  Outpatient Encounter Medications as of 06/11/2017  Medication Sig  . albuterol (PROVENTIL) (2.5 MG/3ML) 0.083% nebulizer solution Take 3 mLs (2.5 mg total) by nebulization every 6 (six) hours as needed for wheezing or shortness of breath.  . cetirizine (ZYRTEC) 10 MG tablet Take 1 tablet (10 mg total) by mouth at bedtime.  . cholecalciferol (VITAMIN D) 400 units TABS tablet Take 1 tablet (400 Units total) by mouth 2 (two) times daily.  . Continuous Blood Gluc Sensor (FREESTYLE LIBRE SENSOR SYSTEM) MISC Use one sensor every 10 days.  Marland Kitchen docusate sodium (COLACE) 100 MG capsule Take 100 mg by mouth daily.  Marland Kitchen escitalopram (LEXAPRO) 10 MG tablet Take 1 tablet (10 mg  total) by mouth at bedtime.  . fenofibrate 54 MG tablet Take 1 tablet (54 mg total) by mouth daily.  . ferrous sulfate 324 (65 Fe) MG TBEC Take 1 tablet by mouth daily.  . furosemide (LASIX) 40 MG tablet Take 2 tablets (80 mg total) by mouth 2 (two) times daily.  Marland Kitchen glucose blood (PRODIGY NO CODING BLOOD GLUC) test strip Use as instructed 4 x daily. E11.65  . Insulin Glargine (BASAGLAR KWIKPEN) 100 UNIT/ML SOPN Inject 0.5 mLs (50 Units total) into the skin daily at 10 pm.  . insulin lispro (HUMALOG KWIKPEN) 100 UNIT/ML KiwkPen Inject 0.1-0.16 mLs (10-16 Units total) into the skin 3 (three) times daily before meals.  . Insulin Pen Needle 32G X 4 MM MISC Use to inject insulin 5 times daily  . ipratropium (ATROVENT) 0.02 % nebulizer solution Take 2.5 mLs (0.5 mg total) by nebulization every 6 (six) hours as needed for wheezing or shortness of breath.  . metFORMIN (GLUCOPHAGE-XR) 500 MG 24 hr tablet Take 1 tablet (500 mg total) by mouth 2 (two) times daily.  . metolazone (ZAROXOLYN) 2.5 MG tablet Take 1 tablet (2.5 mg total) by mouth daily.  . metoprolol succinate (TOPROL-XL) 25 MG 24 hr tablet Take 1 tablet (25 mg total) by mouth 2 (two) times daily.  . mirtazapine (REMERON) 15 MG tablet Take 1 tablet (15 mg total) by mouth at bedtime.  . modafinil (PROVIGIL) 200 MG tablet Take 1 tablet (200 mg  total) by mouth daily.  . mometasone (NASONEX) 50 MCG/ACT nasal spray Place 1 spray into the nose daily.  . montelukast (SINGULAIR) 10 MG tablet Take 1 tablet (10 mg total) by mouth at bedtime.  . mupirocin cream (BACTROBAN) 2 % Apply 1 application topically 2 (two) times daily.  . nitroGLYCERIN (NITROSTAT) 0.4 MG SL tablet Place 1 tablet (0.4 mg total) under the tongue every 5 (five) minutes as needed for chest pain.  Marland Kitchen nystatin (NYSTATIN) powder Apply topically 2 (two) times daily. Apply under breasts  . ondansetron (ZOFRAN-ODT) 4 MG disintegrating tablet Take 1 tablet (4 mg total) by mouth 2 (two) times  daily as needed for nausea or vomiting.  Marland Kitchen oxyCODONE-acetaminophen (PERCOCET/ROXICET) 5-325 MG tablet Take 1 tablet by mouth every 8 (eight) hours as needed for severe pain.  . OXYGEN Inhale 3 L into the lungs continuous. 3 liters 24/7  . pantoprazole (PROTONIX) 20 MG tablet Take 1 tablet (20 mg total) by mouth 2 (two) times daily before a meal.  . PRESCRIPTION MEDICATION Inhale into the lungs See admin instructions. Use BIPAP every time laying down  . primidone (MYSOLINE) 50 MG tablet Take 1 tablet (50 mg total) by mouth at bedtime.  Marland Kitchen QUEtiapine (SEROQUEL) 25 MG tablet Take 1 tablet (25 mg total) by mouth at bedtime.  . triamcinolone lotion (KENALOG) 0.1 % Apply 1 application topically 3 (three) times daily. For up to 14 days and as needed  . vitamin A 10000 UNIT capsule Take 10,000 Units by mouth every morning.  . warfarin (COUMADIN) 4 MG tablet TAKE 1 TABLET BY MOUTH DAILY EXCEPT 1/2 TABLET ON SATURDAYS  . atorvastatin (LIPITOR) 40 MG tablet Take 1 tablet (40 mg total) by mouth daily. (Patient not taking: Reported on 06/11/2017)  . losartan (COZAAR) 25 MG tablet Take 1 tablet (25 mg total) by mouth daily.  . Probiotic Product (PROBIOTIC PO) Take 1 tablet by mouth at bedtime.   No facility-administered encounter medications on file as of 06/11/2017.     Functional Status:  In your present state of health, do you have any difficulty performing the following activities: 06/11/2017 10/16/2016  Hearing? N Y  Comment - has a hearing aide, but doesn't always wear them  Vision? Y Y  Comment - wears glasses, but is legally blind  Difficulty concentrating or making decisions? [name]  Walking or climbing stairs? Y Y  Dressing or bathing? N N  Doing errands, shopping? Y N  Some recent data might be hidden    Fall/Depression Screening: Fall Risk  06/11/2017 05/09/2017 01/03/2017  Falls in the past year? Yes No Yes  Number falls in past yr: 1 - 2 or more  Injury with Fall? Yes - No  Comment hurt wrist - -   Risk Factor Category  High Fall Risk - -  Comment - - -  Risk for fall due to : Impaired balance/gait - Other (Comment);Medication side effect  Risk for fall due to: Comment - - hypoglycemia  Follow up - - -   PHQ 2/9 Scores 06/11/2017 05/09/2017 03/24/2017 12/12/2016 12/05/2016 11/26/2016 11/04/2016  PHQ - 2 Score 1 0 2 2 0 0 0  PHQ- 9 Score - - - 5 - - -    Assessment: Patient will benefit from health coach outreach for disease management and support.   THN CM Care Plan Problem One     Most Recent Value  Care Plan Problem One  Knowledge deficit of management of diabetes  Role Documenting  the Problem One  [place] for Problem One  Active  THN Long Term Goal   IN 90 days the patient will lower her a1c by 1 to 2 points  [facility] Long Term Goal Start Date  06/11/17  Interventions for Problem One Long Term Goal  [facility] will send diabetes educational material for the patient and family to review  THN CM Short Term Goal #1   In the next 30 days the patient will verbalize that she is eating nutritional foods  THN CM Short Term Goal #1 Start Date  06/11/17  Interventions for Short Term Goal #1  [facility] review with the patient about diabetic diet  THN CM Short Term Goal #2   In 30 days the  patient will verbalize that she has written down question to ask her physician  [facility] CM Short Term Goal #2 Start Date  06/11/17  Interventions for Short Term Goal #2  [facility] talked with the patient about following up with her physician with questions she has about her health.      Plan: RN Health Coach will provide ongoing education for patient on diabetes through phone calls and sending printed information to patient for further discussion.  RN Health Coach will send welcome packet with consent to patient as well as printed information on diabetes.  RN Health Coach will send initial barriers letter, assessment, and care plan to primary care physician. RN Health Coach will contact patient in the month of February  and patient agrees to next outreach.  [name] RN, BSN, [place] Direct Dial:  [phone] Fax: [phone]

## 2017-06-12 LAB — BASIC METABOLIC PANEL
BUN/Creatinine Ratio: 25 (ref 12–28)
BUN: 43 mg/dL — AB (ref 8–27)
CO2: 31 mmol/L — AB (ref 20–29)
Calcium: 9.3 mg/dL (ref 8.7–10.3)
Chloride: 94 mmol/L — ABNORMAL LOW (ref 96–106)
Creatinine, Ser: 1.69 mg/dL — ABNORMAL HIGH (ref 0.57–1.00)
GFR calc Af Amer: 36 mL/min/{1.73_m2} — ABNORMAL LOW (ref >59)
GFR, EST NON AFRICAN AMERICAN: 31 mL/min/{1.73_m2} — AB (ref >59)
GLUCOSE: 124 mg/dL — AB (ref 65–99)
Potassium: 3.5 mmol/L (ref 3.5–5.2)
Sodium: 146 mmol/L — ABNORMAL HIGH (ref 134–144)

## 2017-06-13 ENCOUNTER — Ambulatory Visit (INDEPENDENT_AMBULATORY_CARE_PROVIDER_SITE_OTHER): Payer: Medicare Other | Admitting: Internal Medicine

## 2017-06-13 ENCOUNTER — Encounter: Payer: Self-pay | Admitting: Internal Medicine

## 2017-06-13 VITALS — BP 91/48 | HR 58 | Resp 16 | Ht 59.0 in | Wt 181.0 lb

## 2017-06-13 DIAGNOSIS — J438 Other emphysema: Secondary | ICD-10-CM

## 2017-06-13 DIAGNOSIS — I5043 Acute on chronic combined systolic (congestive) and diastolic (congestive) heart failure: Secondary | ICD-10-CM

## 2017-06-13 DIAGNOSIS — N183 Chronic kidney disease, stage 3 unspecified: Secondary | ICD-10-CM

## 2017-06-13 DIAGNOSIS — I4891 Unspecified atrial fibrillation: Secondary | ICD-10-CM

## 2017-06-13 MED ORDER — METOLAZONE 2.5 MG PO TABS: 2.5000 mg | ORAL_TABLET | Freq: Every day | ORAL | 3 refills | Status: DC

## 2017-06-13 MED ORDER — LOSARTAN POTASSIUM 25 MG PO TABS: 12.5000 mg | ORAL_TABLET | Freq: Every day | ORAL | 3 refills | Status: DC

## 2017-06-13 NOTE — Patient Instructions (Addendum)
Your physician has recommended you make the following change in your medication:  -- DECREASE losartan from 25mg  daily to 12.5mg  daily   (half tablet) -- TAKE metolazone in the morning 30 minutes prior to morning dose of lasix  Your physician recommends that you schedule a follow-up appointment in [place] with [name].

## 2017-06-14 ENCOUNTER — Encounter: Payer: Self-pay | Admitting: Internal Medicine

## 2017-06-14 NOTE — Progress Notes (Signed)
OFFICE NOTE  Chief Complaint:  Follow-up edema  Primary Care Physician: Rogue Bussing, MD  HPI:  Denise Freeman is a pleasant 67 year old female who is establishing cardiac care today. She is accompanied by her husband and they recently moved here from [place], [place]. She has a history of severe COPD on home oxygen. She also has a history of stress-induced cardiomyopathy in the past. She has since had recovery of her EF. She's had numerous cardiac catheterizations. None of which showed obstructive coronary disease. Her last cardiac catheterization was in October 2014 which demonstrated no significant coronary disease. This was after a small abnormality was noted in the apex suggestive of ischemia on a nuclear stress test. She does have a history of permanent atrial fibrillation on Coumadin. She will need to have her INRs followed here. Unfortunate she has not had her INR checked in over 9 weeks and it was assessed today and was low. We will need to adjust her medication. She will be established in our anticoagulation clinic. Blood pressure looks well controlled today. She is on cholesterol medication.  I saw Denise Freeman back today in the office. Unfortunate she was hospitalized in December for worsening shortness of breath and probable pneumonia with a mild diastolic heart failure exacerbation. She was given IV diuretics and her Lasix was increased to 40 mg 3 times a day. She's also taking metolazone 3 times weekly. She continues to complain of leg edema which is mostly dependent. She has significant shortness of breath and CO2 retention and an appointment with a pulmonologist is pending.  Denise Freeman returned today back in the office. Her main complaints are a number of excoriations and weeping lesions on her face and arms. She apparently is going to see a dermatologist about this. She's also had worsening leg swelling and shortness of breath. Her weight is now up about 8 pounds since  her last visit. She was instructed to take Lasix 40 mg 3 times a day but apparently she is taking it twice a day. She is taking the metolazone 3 times a week. She is wearing compression stockings which I had advised and seems that this helps her swelling.  Denise Freeman returns today and reports a big improvement in her leg swelling. She's currently on Lasix 60 mg twice daily and metolazone 3x weekly. Weight is come down and swelling is improved. Her BNP was over 250 and is come down to about 125.  I saw Denise Freeman back today in follow-up. She's been successful losing over 15 pounds which I congratulated her on. Her breathing continues to be fairly difficult. She is using BiPAP at night. She's been in the ER a few times for mostly difficulty breathing and COPD exacerbation. She tells me the other day she was in church and had an episode of sharp chest pain in her left anterior chest and left scapular area. She was quite upset and emotional today time. Recently she's been the target of possibly a scam, so that she is under a lot of stress. She was upset that she did not have anything to take for her chest discomfort. I tried to reassure her that her coronary arteries have look normal with multiple catheterizations. She does have some diastolic dysfunction I suspect her LVEDP goes up when she gets upset causing her chest discomfort.  10/31/2015  Denise Freeman was seen today in follow-up from her recent hospitalization. She was seen in January for acute GI bleeding thought to  be upper GI bleeding and required transfusion for hemoglobin of 5. She was evaluated by Dr. Silvano Rusk with [facility] who recommended an EGD. Unfortunately, it looks like she was discharged the next day, possibly [place] after she removed her IV and stated she needed to go home. She relates that she never saw the specialist and does not recall seeing Dr. Carlean Purl. Since that time she's continued to have blood in her  stool. She was taken off of Xarelto and not restarted on anticoagulation. She does have a high CHADSVASC score of 5, suggesting a higher risk of stroke.  08/07/2016  Denise Freeman returns for follow-up. She underwent GI work-up with no clear bleeding source. She does not want to be on anticoagulation for fear of another stroke. She attributes her vision loss and word-finding difficulty to her prior stroke on Xarelto. She was previously on warfarin in the distant past, but reported having difficulty maintaining a therapeutic INR. We discussed the possibility of a Watchman device today - she seems interested in this. I explained the procedure and will refer her to Dr. Rayann Heman for evaluation.  5/30/20181  Denise Freeman was seen today in follow-up. She recently saw Ignacia Bayley, NP, after hospital follow-up. Given her recurrent  cardiomyopathy with EF 30-35%, he recommended starting her on BiDil. This is based on recent lab work demonstrating significant renal insufficiency however repeat lab work shows normal creatinine. She recently has had 10 pound weight gain and feels that it is related to a decrease in her metolazone to a half tablet daily. Previously had had her on 80 mg Lasix twice a day and metolazone 2.5 mg daily.  12/09/2016  Denise Freeman returns today for follow-up. She reports some improvement in her fatigue and breathing. I placed her on both metolazone as well as a losartan. Today her blood pressures noted to be low at 90/50 and she is down about 6 pounds with a diuretic. Recently lab work indicated a small increase in her creatinine up to 1.35 with a baseline of about 1.0-1.1. She says a diuretic is doing good job of keeping her swelling off, however I wonder she may be a little bit over diuresis. In addition she may not be tolerating the ARB.  03/12/2017  Denise Freeman was seen today in follow-up. Her weight is now up about 13 pounds. She denies any worsening swelling. She thinks that it may be due to  eating her large meals at night. Her blood sugars have not been well controlled and apparently there is been some therapeutic disagreements between her an endocrinologist, she is been referred to another endocrinologist in [place]. She remains in permanent A. fib which is rate controlled. She's currently taking metolazone daily in addition to Lasix 80 mg twice a day. She denies any worsening shortness of breath or chest pain. She remains on home oxygen for COPD which is severe.   06/13/2017  Denise Freeman returns today for follow-up. She has had recurrent edema. See telephone notes for details. Weight was up 10 lbs. Her lasix was increased to 80 mg TID in addition to her 2.5 mg metolazone daily - apparently, her pharmacy is packaging this for her to take at night, but it would be better in the morning. Recently labs indicate creatinine of 1.6, therefore, we had to reduce her lasix. Weight today is 181 which is back at her dry weight. She also reports stopping her lipitor 2 weeks ago because of memory problems which she says is better.  PMHx:  Past Medical History:  Diagnosis Date  . Allergy   . Anemia   . Anxiety   . Asthma   . Atrial fibrillation ([place])   . Bipolar 1 disorder ([place])   . 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 Cholelithiasis    a. 09/2016 s/p Lap Chole.  . Chronic bronchitis ([place])   . Chronic combined systolic and diastolic congestive heart failure ([place])    a. 09/2016 Echo: EF 30-35%.  . Colon polyps   . COPD (chronic obstructive pulmonary disease) ([place])   . Depression   . Family history of adverse reaction to anesthesia    Uncle was positive for malignant hyperthermia; patient had testing done and was negative.  . Fibromyalgia   . GERD (gastroesophageal reflux disease)   . Gout   . High cholesterol   . History of blood transfusion 06/2015   "bleeding from my rectum"  . History of hiatal hernia   . HOH (hard of hearing)   . Hx of colonic polyps 03/21/2016   3  small adenomas no recall - co-morbidities  . Neuropathy    Disc Back   . NICM (nonischemic cardiomyopathy) ([place])    a. Previously worked up in [place], [place]-->low EF with subsequent recovery.  Multiple caths (last ~ 2014 per pt report)--reportedly nl cors;  b. 09/2016 Echo: EF 30-35%, antsept/apical HK, mild MR, mildly dil LA, mod dil RA;  c. 09/2016 Lexi MV: EF 26%, glob HK, sept DK, med size, mod intensity fixed septal defect - BBB/PVC related artifact, no ischemia.  . On home oxygen therapy    "3L; 24/7" (03/14/2016)  . OSA treated with BiPAP    uses biPAP, 10 (03/14/2016)  . Osteoarthritis   . Oxygen deficiency   . Pneumonia   . Type II diabetes mellitus ([place])     Past Surgical History:  Procedure Laterality Date  . APPENDECTOMY     "they busted"  . bladder stimulator     pt states, "it cannot be turned off; it's in my right hip; dead battery so it's not working anymore". (03/14/2016)  . BLADDER SUSPENSION     2003, 2006 and 2010  . CATARACT EXTRACTION W/PHACO Right 11/29/2014   Procedure: CATARACT EXTRACTION PHACO AND INTRAOCULAR LENS PLACEMENT (IOC);  Surgeon: Rutherford Guys, MD;  Location: [facility];  Service: Ophthalmology;  Laterality: Right;  CDE:3.81  . CATARACT EXTRACTION W/PHACO Left 12/13/2014   Procedure: CATARACT EXTRACTION PHACO AND INTRAOCULAR LENS PLACEMENT (IOC);  Surgeon: Rutherford Guys, MD;  Location: [facility];  Service: Ophthalmology;  Laterality: Left;  CDE:6.59  . CERVICAL DISC SURGERY N/A 2009   4, 6, and 7 cervical disc replaced  . CHOLECYSTECTOMY N/A 09/13/2016   Procedure: LAPAROSCOPIC CHOLECYSTECTOMY;  Surgeon: Rolm Bookbinder, MD;  Location: [facility];  Service: General;  Laterality: N/A;  . COLONOSCOPY WITH PROPOFOL N/A 03/15/2016   Procedure: COLONOSCOPY WITH PROPOFOL;  Surgeon: Gatha Mayer, MD;  Location: [facility];  Service: Endoscopy;  Laterality: N/A;  . ESOPHAGOGASTRODUODENOSCOPY (EGD) WITH PROPOFOL N/A 03/15/2016   Procedure: ESOPHAGOGASTRODUODENOSCOPY  (EGD) WITH PROPOFOL;  Surgeon: Gatha Mayer, MD;  Location: [facility];  Service: Endoscopy;  Laterality: N/A;  . HEEL SPUR SURGERY Bilateral   . HERNIA REPAIR    . I&D EXTREMITY Right 06/13/2015   Procedure: MINOR IRRIGATION AND DEBRIDEMENT EXTREMITY REMOVAL OF NAIL;  Surgeon: Daryll Brod, MD;  Location: [facility];  Service: Orthopedics;  Laterality: Right;  . TUBAL LIGATION    .  UMBILICAL HERNIA REPAIR     w/mesh    FAMHx:  Family History  Problem Relation Age of Onset  . Heart disease Mother   . COPD Mother   . Diabetes Mother   . Breast cancer Mother   . Heart disease Father   . Hyperlipidemia Father   . COPD Sister   . Heart disease Sister   . Diabetes Sister   . Heart disease Maternal Grandmother   . Cancer Maternal Grandmother        stomach  . Cancer Maternal Grandfather        lung   . Bipolar disorder Brother     SOCHx:   reports that she quit smoking about 28 years ago. Her smoking use included cigarettes. She has a 28.00 pack-year smoking history. she has never used smokeless tobacco. She reports that she does not drink alcohol or use drugs.  ALLERGIES:  Allergies  Allergen Reactions  . Xarelto [Rivaroxaban] Other (See Comments)    Internal bleeding  . Ancef [Cefazolin] Nausea And Vomiting  . Levaquin [Levofloxacin In D5w] Other (See Comments)    "afib"  . Lyrica [Pregabalin] Hives  . Tamiflu [Oseltamivir Phosphate] Other (See Comments)    "water blisters"  . Zoloft [Sertraline Hcl] Other (See Comments)    Jaw problems, jittery  . Augmentin [Amoxicillin-Pot Clavulanate] Itching  . Ciprofloxacin Itching and Nausea And Vomiting  . Haldol [Haloperidol] Other (See Comments)    Restless leg  . Nsaids Diarrhea  . Penicillins Itching, Nausea And Vomiting and Rash    Has patient had a PCN reaction causing immediate rash, facial/tongue/throat swelling, SOB or lightheadedness with hypotension: Yes Has patient had a PCN reaction causing severe  rash involving mucus membranes or skin necrosis: No Has patient had a PCN reaction that required hospitalization No Has patient had a PCN reaction occurring within the last 10 years: Yes If all of the above answers are "NO", then may proceed with Cephalosporin use.  . Topamax [Topiramate] Nausea Only    ROS: Pertinent items noted in HPI and remainder of comprehensive ROS otherwise negative.  HOME MEDS: Current Outpatient Medications  Medication Sig Dispense Refill  . albuterol (PROVENTIL) (2.5 MG/3ML) 0.083% nebulizer solution Take 3 mLs (2.5 mg total) by nebulization every 6 (six) hours as needed for wheezing or shortness of breath. 75 mL 5  . cetirizine (ZYRTEC) 10 MG tablet Take 1 tablet (10 mg total) by mouth at bedtime. 30 tablet 11  . cholecalciferol (VITAMIN D) 400 units TABS tablet Take 1 tablet (400 Units total) by mouth 2 (two) times daily. 180 each 3  . Continuous Blood Gluc Sensor (FREESTYLE LIBRE SENSOR SYSTEM) MISC Use one sensor every 10 days. 3 each 2  . docusate sodium (COLACE) 100 MG capsule Take 100 mg by mouth daily.    Marland Kitchen escitalopram (LEXAPRO) 10 MG tablet Take 1 tablet (10 mg total) by mouth at bedtime. 90 tablet 0  . fenofibrate 54 MG tablet Take 1 tablet (54 mg total) by mouth daily. 30 tablet 3  . ferrous sulfate 324 (65 Fe) MG TBEC Take 1 tablet by mouth daily.    . furosemide (LASIX) 40 MG tablet Take 2 tablets (80 mg total) by mouth 2 (two) times daily. 360 tablet 3  . glucose blood (PRODIGY NO CODING BLOOD GLUC) test strip Use as instructed 4 x daily. E11.65 150 each 5  . Insulin Glargine (BASAGLAR KWIKPEN) 100 UNIT/ML SOPN Inject 0.5 mLs (50 Units total) into the skin daily  at 10 pm. 15 mL 2  . insulin lispro (HUMALOG KWIKPEN) 100 UNIT/ML KiwkPen Inject 0.1-0.16 mLs (10-16 Units total) into the skin 3 (three) times daily before meals. 15 mL 2  . Insulin Pen Needle 32G X 4 MM MISC Use to inject insulin 5 times daily 500 each 5  . ipratropium (ATROVENT) 0.02 %  nebulizer solution Take 2.5 mLs (0.5 mg total) by nebulization every 6 (six) hours as needed for wheezing or shortness of breath. 75 mL 5  . metFORMIN (GLUCOPHAGE-XR) 500 MG 24 hr tablet Take 1 tablet (500 mg total) by mouth 2 (two) times daily. 60 tablet 2  . metolazone (ZAROXOLYN) 2.5 MG tablet Take 1 tablet (2.5 mg total) by mouth daily. Take 30 minutes prior to morning LASIX 90 tablet 3  . metoprolol succinate (TOPROL-XL) 25 MG 24 hr tablet Take 1 tablet (25 mg total) by mouth 2 (two) times daily. 180 tablet 3  . mirtazapine (REMERON) 15 MG tablet Take 1 tablet (15 mg total) by mouth at bedtime. 90 tablet 0  . modafinil (PROVIGIL) 200 MG tablet Take 1 tablet (200 mg total) by mouth daily. 30 tablet 5  . mometasone (NASONEX) 50 MCG/ACT nasal spray Place 1 spray into the nose daily. 17 g 11  . montelukast (SINGULAIR) 10 MG tablet Take 1 tablet (10 mg total) by mouth at bedtime. 30 tablet 6  . mupirocin cream (BACTROBAN) 2 % Apply 1 application topically 2 (two) times daily. 15 g 0  . nitroGLYCERIN (NITROSTAT) 0.4 MG SL tablet Place 1 tablet (0.4 mg total) under the tongue every 5 (five) minutes as needed for chest pain. 25 tablet 3  . nystatin (NYSTATIN) powder Apply topically 2 (two) times daily. Apply under breasts 15 g 3  . ondansetron (ZOFRAN-ODT) 4 MG disintegrating tablet Take 1 tablet (4 mg total) by mouth 2 (two) times daily as needed for nausea or vomiting. 30 tablet 0  . oxyCODONE-acetaminophen (PERCOCET/ROXICET) 5-325 MG tablet Take 1 tablet by mouth every 8 (eight) hours as needed for severe pain.    . OXYGEN Inhale 3 L into the lungs continuous. 3 liters 24/7    . pantoprazole (PROTONIX) 20 MG tablet Take 1 tablet (20 mg total) by mouth 2 (two) times daily before a meal. 180 tablet 1  . PRESCRIPTION MEDICATION Inhale into the lungs See admin instructions. Use BIPAP every time laying down    . primidone (MYSOLINE) 50 MG tablet Take 1 tablet (50 mg total) by mouth at bedtime. 90 tablet 3   . Probiotic Product (PROBIOTIC PO) Take 1 tablet by mouth at bedtime.    Marland Kitchen QUEtiapine (SEROQUEL) 25 MG tablet Take 1 tablet (25 mg total) by mouth at bedtime. 90 tablet 0  . triamcinolone lotion (KENALOG) 0.1 % Apply 1 application topically 3 (three) times daily. For up to 14 days and as needed 60 mL 1  . vitamin A 10000 UNIT capsule Take 10,000 Units by mouth every morning.    . warfarin (COUMADIN) 4 MG tablet TAKE 1 TABLET BY MOUTH DAILY EXCEPT 1/2 TABLET ON SATURDAYS 30 tablet 4  . losartan (COZAAR) 25 MG tablet Take 0.5 tablets (12.5 mg total) by mouth daily. 45 tablet 3   No current facility-administered medications for this visit.     LABS/IMAGING: No results found for this or any previous visit (from the past 48 hour(s)). No results found.  VITALS: BP (!) 91/48   Pulse (!) 58   Resp 16   Ht 4\' 11"  (  1.499 m)   Wt 181 lb (82.1 kg)   SpO2 99%   BMI 36.56 kg/m   EXAM: General appearance: alert, appears older than stated age and no distress Neck: no carotid bruit and no JVD Lungs: diminished breath sounds bilaterally Heart: irregularly irregular rhythm Abdomen: soft, non-tender; bowel sounds normal; no masses,  no organomegaly Extremities: extremities normal, atraumatic, no cyanosis or edema Pulses: 2+ and symmetric Skin: Skin color, texture, turgor normal. No rashes or lesions Neurologic: Grossly normal Psych: pleasant  EKG: A-fib with PVC's at 70, low voltage - personally reviewed  ASSESSMENT: 1. Permanent atrial fibrillation - not on anticoagulation due to recent GI bleeding and vision loss attributed to stroke 2. Acute on Chronic combined congestive heart failure - EF 30-35% 3. History of stress-induced cardiomyopathy 4. No significant obstructive coronary disease after multiple catheterizations in 1996, 99, 2007 and 2014. 5. Fibromyalgia 6. Bipolar 1 disorder 7. Dyslipidemia 8. Hypertension 9. Severe COPD on home oxygen 10. Obstructive sleep apnea on  CPAP 11. Morbid obesity - with weight loss recently 12. DNR  PLAN: 1.   Denise Freeman is not back to euvolemia, however, creatinine is elevated - we have backed off to lasix 80 mg BID with metolazone 2.5 mg daily - she should take this in the morning. She has stopped lipitor due to concern over cognitive SE's - will keep off for now. BP has been noted to be low - will decrease losartan to 12.5 mg daily since she is experiencing some fatigue.  Follow-up in 3 months.  Pixie Casino, MD, [facility], [place] Director of the Advanced Lipid Disorders &  Cardiovascular Risk Reduction Clinic Diplomate of the American Board of Clinical Lipidology Attending Cardiologist  Direct Dial: [phone]  Fax: [phone]  Website:  [email or web address] 06/14/2017, 2:16 PM

## 2017-06-16 ENCOUNTER — Telehealth: Payer: Self-pay | Admitting: Pulmonary Disease

## 2017-06-16 NOTE — Telephone Encounter (Signed)
Left message for pt to call us back x1

## 2017-06-17 ENCOUNTER — Telehealth: Payer: Self-pay

## 2017-06-17 NOTE — Telephone Encounter (Signed)
These are all acceptable blood glucose readings and  intended treatment targets. No need to change insulin dose at this time.

## 2017-06-17 NOTE — Telephone Encounter (Signed)
Called pt due to her returning a previous call from [place] but pt stated she could not remember who it was that had originally called her. I saw where an appt for a 6 month follow up had been done so I figured out it as that they were calling her to remind her of the appt. Nothing further needed.

## 2017-06-17 NOTE — Telephone Encounter (Signed)
Pt states has had low BG readings.   Date Before breakfast Before lunch Before supper Bedtime  1/13 98 [phone]  1/14 132 138 130 129  1/15 130 98            Pt taking: Basaglar 50 qhs, humalog 10-16 tidac, MTF 500 bid

## 2017-06-18 NOTE — Telephone Encounter (Signed)
Pt notified. She states that she does not agree and wanted to make appt. Appt made for 06/25/17

## 2017-06-19 ENCOUNTER — Other Ambulatory Visit: Payer: Self-pay | Admitting: "Endocrinology

## 2017-06-19 ENCOUNTER — Other Ambulatory Visit (HOSPITAL_COMMUNITY): Payer: Self-pay | Admitting: Psychiatry

## 2017-06-19 MED ORDER — QUETIAPINE FUMARATE 25 MG PO TABS: 25.0000 mg | ORAL_TABLET | Freq: Every day | ORAL | 0 refills | Status: DC

## 2017-06-19 MED ORDER — ESCITALOPRAM OXALATE 10 MG PO TABS: 10.0000 mg | ORAL_TABLET | Freq: Every day | ORAL | 0 refills | Status: DC

## 2017-06-25 ENCOUNTER — Ambulatory Visit (INDEPENDENT_AMBULATORY_CARE_PROVIDER_SITE_OTHER): Payer: Medicare Other | Admitting: *Deleted

## 2017-06-25 ENCOUNTER — Ambulatory Visit (INDEPENDENT_AMBULATORY_CARE_PROVIDER_SITE_OTHER): Payer: Medicare Other | Admitting: "Endocrinology

## 2017-06-25 ENCOUNTER — Encounter: Payer: Self-pay | Admitting: "Endocrinology

## 2017-06-25 VITALS — BP 102/72 | HR 74 | Ht 59.0 in | Wt 184.0 lb

## 2017-06-25 DIAGNOSIS — Z5181 Encounter for therapeutic drug level monitoring: Secondary | ICD-10-CM | POA: Diagnosis not present

## 2017-06-25 DIAGNOSIS — E1122 Type 2 diabetes mellitus with diabetic chronic kidney disease: Secondary | ICD-10-CM

## 2017-06-25 DIAGNOSIS — E1159 Type 2 diabetes mellitus with other circulatory complications: Secondary | ICD-10-CM | POA: Diagnosis not present

## 2017-06-25 DIAGNOSIS — Z6838 Body mass index (BMI) 38.0-38.9, adult: Secondary | ICD-10-CM | POA: Diagnosis not present

## 2017-06-25 DIAGNOSIS — E782 Mixed hyperlipidemia: Secondary | ICD-10-CM | POA: Diagnosis not present

## 2017-06-25 DIAGNOSIS — N183 Chronic kidney disease, stage 3 unspecified: Secondary | ICD-10-CM

## 2017-06-25 DIAGNOSIS — I10 Essential (primary) hypertension: Secondary | ICD-10-CM | POA: Diagnosis not present

## 2017-06-25 DIAGNOSIS — Z794 Long term (current) use of insulin: Secondary | ICD-10-CM | POA: Diagnosis not present

## 2017-06-25 DIAGNOSIS — I480 Paroxysmal atrial fibrillation: Secondary | ICD-10-CM | POA: Diagnosis not present

## 2017-06-25 LAB — POCT INR: INR: 4.7

## 2017-06-25 NOTE — Patient Instructions (Signed)
Hold coumadin tonight and tomorrow night then decrease dose to 1 tablet daily except 1/2 tablet on Tuesdays and Saturdays Recheck in 2 weeks

## 2017-06-25 NOTE — Progress Notes (Addendum)
Consult Note       06/25/2017, 4:29 PM   Subjective:    Patient ID: Denise Freeman, female    DOB: Apr 10, 1951.  she is being seen in f/u  for management of currently uncontrolled symptomatic diabetes requested by  Rogue Bussing, MD.   Past Medical History:  Diagnosis Date  . Allergy   . Anemia   . Anxiety   . Asthma   . Atrial fibrillation ([place])   . Bipolar 1 disorder ([place])   . 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 Cholelithiasis    a. 09/2016 s/p Lap Chole.  . Chronic bronchitis ([place])   . Chronic combined systolic and diastolic congestive heart failure ([place])    a. 09/2016 Echo: EF 30-35%.  . Colon polyps   . COPD (chronic obstructive pulmonary disease) ([place])   . Depression   . Family history of adverse reaction to anesthesia    Uncle was positive for malignant hyperthermia; patient had testing done and was negative.  . Fibromyalgia   . GERD (gastroesophageal reflux disease)   . Gout   . High cholesterol   . History of blood transfusion 06/2015   "bleeding from my rectum"  . History of hiatal hernia   . HOH (hard of hearing)   . Hx of colonic polyps 03/21/2016   3 small adenomas no recall - co-morbidities  . Neuropathy    Disc Back   . NICM (nonischemic cardiomyopathy) ([place])    a. Previously worked up in [place], MD-->low EF with subsequent recovery.  Multiple caths (last ~ 2014 per pt report)--reportedly nl cors;  b. 09/2016 Echo: EF 30-35%, antsept/apical HK, mild MR, mildly dil LA, mod dil RA;  c. 09/2016 Lexi MV: EF 26%, glob HK, sept DK, med size, mod intensity fixed septal defect - BBB/PVC related artifact, no ischemia.  . On home oxygen therapy    "3L; 24/7" (03/14/2016)  . OSA treated with BiPAP    uses biPAP, 10 (03/14/2016)  . Osteoarthritis   . Oxygen deficiency   . Pneumonia   . Type II diabetes mellitus ([place])    Past Surgical History:  Procedure  Laterality Date  . APPENDECTOMY     "they busted"  . bladder stimulator     pt states, "it cannot be turned off; it's in my right hip; dead battery so it's not working anymore". (03/14/2016)  . BLADDER SUSPENSION     2003, 2006 and 2010  . CATARACT EXTRACTION W/PHACO Right 11/29/2014   Procedure: CATARACT EXTRACTION PHACO AND INTRAOCULAR LENS PLACEMENT (IOC);  Surgeon: Rutherford Guys, MD;  Location: [facility];  Service: Ophthalmology;  Laterality: Right;  CDE:3.81  . CATARACT EXTRACTION W/PHACO Left 12/13/2014   Procedure: CATARACT EXTRACTION PHACO AND INTRAOCULAR LENS PLACEMENT (IOC);  Surgeon: Rutherford Guys, MD;  Location: [facility];  Service: Ophthalmology;  Laterality: Left;  CDE:6.59  . CERVICAL DISC SURGERY N/A 2009   4, 6, and 7 cervical disc replaced  . CHOLECYSTECTOMY N/A 09/13/2016   Procedure: LAPAROSCOPIC CHOLECYSTECTOMY;  Surgeon: Rolm Bookbinder, MD;  Location: [facility];  Service: General;  Laterality: N/A;  .  COLONOSCOPY WITH PROPOFOL N/A 03/15/2016   Procedure: COLONOSCOPY WITH PROPOFOL;  Surgeon: Gatha Mayer, MD;  Location: [facility];  Service: Endoscopy;  Laterality: N/A;  . ESOPHAGOGASTRODUODENOSCOPY (EGD) WITH PROPOFOL N/A 03/15/2016   Procedure: ESOPHAGOGASTRODUODENOSCOPY (EGD) WITH PROPOFOL;  Surgeon: Gatha Mayer, MD;  Location: [facility];  Service: Endoscopy;  Laterality: N/A;  . HEEL SPUR SURGERY Bilateral   . HERNIA REPAIR    . I&D EXTREMITY Right 06/13/2015   Procedure: MINOR IRRIGATION AND DEBRIDEMENT EXTREMITY REMOVAL OF NAIL;  Surgeon: Daryll Brod, MD;  Location: [facility];  Service: Orthopedics;  Laterality: Right;  . TUBAL LIGATION    . UMBILICAL HERNIA REPAIR     w/mesh   Social History   Socioeconomic History  . Marital status: Married    Spouse name: None  . Number of children: 1  . Years of education: None  . Highest education level: None  Social Needs  . Financial resource strain: None  . Food insecurity - worry: None  . Food  insecurity - inability: None  . Transportation needs - medical: None  . Transportation needs - non-medical: None  Occupational History  . Occupation: house wife  Tobacco Use  . Smoking status: Former Smoker    Packs/day: 2.00    Years: 14.00    Pack years: 28.00    Types: Cigarettes    Last attempt to quit: 04/12/1989    Years since quitting: 28.2  . Smokeless tobacco: Never Used  Substance and Sexual Activity  . Alcohol use: No    Alcohol/week: 0.0 oz  . Drug use: No  . Sexual activity: No  Other Topics Concern  . None  Social History Narrative   Married   Disabled   1 child; 69 yrs   6 pregnancies   1 child   Outpatient Encounter Medications as of 06/25/2017  Medication Sig  . albuterol (PROVENTIL) (2.5 MG/3ML) 0.083% nebulizer solution Take 3 mLs (2.5 mg total) by nebulization every 6 (six) hours as needed for wheezing or shortness of breath.  . cetirizine (ZYRTEC) 10 MG tablet Take 1 tablet (10 mg total) by mouth at bedtime.  . cholecalciferol (VITAMIN D) 400 units TABS tablet Take 1 tablet (400 Units total) by mouth 2 (two) times daily.  . Continuous Blood Gluc Sensor (FREESTYLE LIBRE SENSOR SYSTEM) MISC Use one sensor every 10 days.  Marland Kitchen docusate sodium (COLACE) 100 MG capsule Take 100 mg by mouth daily.  Marland Kitchen escitalopram (LEXAPRO) 10 MG tablet Take 1 tablet (10 mg total) by mouth at bedtime.  . fenofibrate 54 MG tablet Take 1 tablet (54 mg total) by mouth daily.  . ferrous sulfate 324 (65 Fe) MG TBEC Take 1 tablet by mouth daily.  . furosemide (LASIX) 40 MG tablet Take 2 tablets (80 mg total) by mouth 2 (two) times daily.  Marland Kitchen glucose blood (PRODIGY NO CODING BLOOD GLUC) test strip Use as instructed 4 x daily. E11.65  . HUMALOG KWIKPEN 100 UNIT/ML KiwkPen INJECT 0.1 -0.6 MLS (10 -16 UNITS TOTAL) INTO THE SKIN THREE TIMES DAILY BEFORE MEALS.  Marland Kitchen Insulin Glargine (BASAGLAR KWIKPEN) 100 UNIT/ML SOPN INJECT 0.4 MLS (40 UNITS TOTAL) INTO THE SKIN EVERY MORNING.  . Insulin Pen  Needle 32G X 4 MM MISC Use to inject insulin 5 times daily  . ipratropium (ATROVENT) 0.02 % nebulizer solution Take 2.5 mLs (0.5 mg total) by nebulization every 6 (six) hours as needed for wheezing or shortness of breath.  . losartan (COZAAR) 25 MG  tablet Take 0.5 tablets (12.5 mg total) by mouth daily.  . metolazone (ZAROXOLYN) 2.5 MG tablet Take 1 tablet (2.5 mg total) by mouth daily. Take 30 minutes prior to morning LASIX  . metoprolol succinate (TOPROL-XL) 25 MG 24 hr tablet Take 1 tablet (25 mg total) by mouth 2 (two) times daily.  . mirtazapine (REMERON) 15 MG tablet Take 1 tablet (15 mg total) by mouth at bedtime.  . modafinil (PROVIGIL) 200 MG tablet Take 1 tablet (200 mg total) by mouth daily.  . mometasone (NASONEX) 50 MCG/ACT nasal spray Place 1 spray into the nose daily.  . montelukast (SINGULAIR) 10 MG tablet Take 1 tablet (10 mg total) by mouth at bedtime.  . mupirocin cream (BACTROBAN) 2 % Apply 1 application topically 2 (two) times daily.  . nitroGLYCERIN (NITROSTAT) 0.4 MG SL tablet Place 1 tablet (0.4 mg total) under the tongue every 5 (five) minutes as needed for chest pain.  Marland Kitchen nystatin (NYSTATIN) powder Apply topically 2 (two) times daily. Apply under breasts  . ondansetron (ZOFRAN-ODT) 4 MG disintegrating tablet Take 1 tablet (4 mg total) by mouth 2 (two) times daily as needed for nausea or vomiting.  Marland Kitchen oxyCODONE-acetaminophen (PERCOCET/ROXICET) 5-325 MG tablet Take 1 tablet by mouth every 8 (eight) hours as needed for severe pain.  . OXYGEN Inhale 3 L into the lungs continuous. 3 liters 24/7  . pantoprazole (PROTONIX) 20 MG tablet Take 1 tablet (20 mg total) by mouth 2 (two) times daily before a meal.  . PRESCRIPTION MEDICATION Inhale into the lungs See admin instructions. Use BIPAP every time laying down  . primidone (MYSOLINE) 50 MG tablet Take 1 tablet (50 mg total) by mouth at bedtime.  . Probiotic Product (PROBIOTIC PO) Take 1 tablet by mouth at bedtime.  Marland Kitchen QUEtiapine  (SEROQUEL) 25 MG tablet Take 1 tablet (25 mg total) by mouth at bedtime.  . triamcinolone lotion (KENALOG) 0.1 % Apply 1 application topically 3 (three) times daily. For up to 14 days and as needed  . vitamin A 10000 UNIT capsule Take 10,000 Units by mouth every morning.  . warfarin (COUMADIN) 4 MG tablet TAKE 1 TABLET BY MOUTH DAILY EXCEPT 1/2 TABLET ON SATURDAYS  . [DISCONTINUED] metFORMIN (GLUCOPHAGE-XR) 500 MG 24 hr tablet TAKE 1 TABLET BY MOUTH TWICE DAILY   No facility-administered encounter medications on file as of 06/25/2017.     ALLERGIES: Allergies  Allergen Reactions  . Xarelto [Rivaroxaban] Other (See Comments)    Internal bleeding  . Ancef [Cefazolin] Nausea And Vomiting  . Levaquin [Levofloxacin In D5w] Other (See Comments)    "afib"  . Lyrica [Pregabalin] Hives  . Tamiflu [Oseltamivir Phosphate] Other (See Comments)    "water blisters"  . Zoloft [Sertraline Hcl] Other (See Comments)    Jaw problems, jittery  . Augmentin [Amoxicillin-Pot Clavulanate] Itching  . Ciprofloxacin Itching and Nausea And Vomiting  . Haldol [Haloperidol] Other (See Comments)    Restless leg  . Nsaids Diarrhea  . Penicillins Itching, Nausea And Vomiting and Rash    Has patient had a PCN reaction causing immediate rash, facial/tongue/throat swelling, SOB or lightheadedness with hypotension: Yes Has patient had a PCN reaction causing severe rash involving mucus membranes or skin necrosis: No Has patient had a PCN reaction that required hospitalization No Has patient had a PCN reaction occurring within the last 10 years: Yes If all of the above answers are "NO", then may proceed with Cephalosporin use.  . Topamax [Topiramate] Nausea Only    VACCINATION  STATUS: Immunization History  Administered Date(s) Administered  . Influenza Split 03/03/2014  . Influenza,inj,Quad PF,6+ Mos 01/30/2016, 01/27/2017  . Influenza-Unspecified 02/06/2015  . Pneumococcal Conjugate-13 01/30/2016  . Pneumococcal  Polysaccharide-23 11/24/2016  . Tetanus 06/03/2013    Diabetes  She presents for her follow-up diabetic visit. She has type 2 diabetes mellitus. Onset time: She was diagnosed at approximate age of 14 years. Her disease course has been improving. There are no hypoglycemic associated symptoms. Pertinent negatives for hypoglycemia include no confusion, headaches, pallor or seizures. Associated symptoms include fatigue, polydipsia, polyphagia and visual change. Pertinent negatives for diabetes include no chest pain and no polyuria. There are no hypoglycemic complications. Symptoms are improving. Diabetic complications include heart disease, nephropathy, peripheral neuropathy and retinopathy. Risk factors for coronary artery disease include family history, dyslipidemia, diabetes mellitus, hypertension, obesity, post-menopausal, sedentary lifestyle and tobacco exposure. Current diabetic treatment includes insulin injections and oral agent (monotherapy) (She is currently on metformin 500 mg by mouth twice a day, Basaglar 50 units every bedtime , Humalog 10 units 3 times a day before meals.). Her weight is decreasing steadily. She is following a generally unhealthy diet. When asked about meal planning, she reported none. She has not had a previous visit with a dietitian. She never participates in exercise. Her breakfast blood glucose range is generally 140-180 mg/dl. Her lunch blood glucose range is generally 140-180 mg/dl. Her dinner blood glucose range is generally 140-180 mg/dl. Her bedtime blood glucose range is generally 140-180 mg/dl. Her overall blood glucose range is 180-200 mg/dl. An ACE inhibitor/angiotensin II receptor blocker is being taken. She does not see a podiatrist.Eye exam is current (She is legally blind on both eyes reportedly unrelated to her diabetes.).  Hyperlipidemia  This is a chronic problem. The current episode started more than 1 year ago. The problem is controlled. Exacerbating diseases  include diabetes, hypothyroidism and obesity. Pertinent negatives include no chest pain, myalgias or shortness of breath. Current antihyperlipidemic treatment includes statins and fibric acid derivatives. Risk factors for coronary artery disease include diabetes mellitus, dyslipidemia, family history, hypertension, obesity, a sedentary lifestyle and post-menopausal.  Hypertension  This is a chronic problem. The current episode started more than 1 year ago. Pertinent negatives include no chest pain, headaches, palpitations or shortness of breath. Risk factors for coronary artery disease include dyslipidemia, diabetes mellitus, obesity, sedentary lifestyle and smoking/tobacco exposure. Past treatments include angiotensin blockers and diuretics. Hypertensive end-organ damage includes retinopathy.     Review of Systems  Constitutional: Positive for fatigue. Negative for chills, fever and unexpected weight change.  HENT: Negative for trouble swallowing and voice change.   Eyes: Negative for visual disturbance.  Respiratory: Negative for cough, shortness of breath and wheezing.   Cardiovascular: Negative for chest pain, palpitations and leg swelling.  Gastrointestinal: Negative for diarrhea, nausea and vomiting.  Endocrine: Positive for polydipsia and polyphagia. Negative for cold intolerance, heat intolerance and polyuria.  Musculoskeletal: Positive for gait problem. Negative for arthralgias and myalgias.       Patient uses a specialized walker due to deconditioning, disequilibrium, blindness.  Skin: Negative for color change, pallor, rash and wound.  Neurological: Negative for seizures and headaches.  Psychiatric/Behavioral: Negative for confusion and suicidal ideas.    Objective:    BP 102/72   Pulse 74   Ht 4\' 11"  (1.499 m)   Wt 184 lb (83.5 kg)   BMI 37.16 kg/m   Wt Readings from Last 3 Encounters:  06/25/17 184 lb (83.5 kg)  06/13/17 181  lb (82.1 kg)  05/09/17 186 lb (84.4 kg)      Physical Exam  Constitutional: She is oriented to person, place, and time. She appears well-developed.  HENT:  Head: Normocephalic and atraumatic.  Eyes: EOM are normal.  Neck: Normal range of motion. Neck supple. No tracheal deviation present. No thyromegaly present.  Cardiovascular: Normal rate and regular rhythm.  Pulmonary/Chest: Effort normal and breath sounds normal.  Abdominal: Soft. Bowel sounds are normal. There is no tenderness. There is no guarding.  Musculoskeletal: She exhibits no edema.  She uses a specialized walker to ambulate due to deconditioning, disequilibrium, blindness.  Neurological: She is alert and oriented to person, place, and time. She has normal reflexes. No cranial nerve deficit. Coordination normal.  Hard of hearing.  Skin: Skin is warm and dry. No rash noted. No erythema. No pallor.  Psychiatric: She has a normal mood and affect. Judgment normal.    CMP     Component Value Date/Time   NA 146 (H) 06/11/2017 1151   K 3.5 06/11/2017 1151   CL 94 (L) 06/11/2017 1151   CO2 31 (H) 06/11/2017 1151   GLUCOSE 124 (H) 06/11/2017 1151   GLUCOSE 174 (H) 04/01/2017 1135   BUN 43 (H) 06/11/2017 1151   CREATININE 1.69 (H) 06/11/2017 1151   CREATININE 1.25 (H) 04/01/2017 1135   CALCIUM 9.3 06/11/2017 1151   PROT 7.3 04/01/2017 1135   ALBUMIN 3.1 (L) 09/14/2016 0450   AST 15 04/01/2017 1135   ALT 8 04/01/2017 1135   ALKPHOS 74 09/14/2016 0450   BILITOT 0.4 04/01/2017 1135   GFRNONAA 31 (L) 06/11/2017 1151   GFRNONAA 45 (L) 04/01/2017 1135   GFRAA 36 (L) 06/11/2017 1151   GFRAA 52 (L) 04/01/2017 1135     Diabetic Labs (most recent): Lab Results  Component Value Date   HGBA1C 8.3 (H) 04/01/2017   HGBA1C 7.9 01/27/2017   HGBA1C 8.1 09/27/2016     Lipid Panel ( most recent) Lipid Panel     Component Value Date/Time   CHOL 148 09/10/2016 1147   TRIG 150 (H) 09/10/2016 1147   HDL 24 (L) 09/10/2016 1147   CHOLHDL 6.2 09/10/2016 1147   VLDL 30  09/10/2016 1147   LDLCALC 94 09/10/2016 1147   LDLDIRECT 153.0 04/19/2016 0932      Assessment & Plan:   1. DM type 2 causing vascular disease , Stage 3 renal insufficiency, blindness - Patient has currently uncontrolled symptomatic type 2 DM since  67 years of age. - She came with more consistent, near target blood glucose readings. More importantly, she did not have any hypoglycemia. - Her recent A1c is not reported , her last A1c was 8.3%.    -her diabetes is complicated by coronary artery disease, exercise information, stage 3 renal insufficiency, blindness and Jodiann L Kackley remains at a high risk for more acute and chronic complications which include CAD, CVA, CKD, retinopathy, and neuropathy. These are all discussed in detail with the patient.  - I have counseled her on diet management and weight loss, by adopting a carbohydrate restricted/protein rich diet.  -  Suggestion is made for her to avoid simple carbohydrates  from her diet including Cakes, Sweet Desserts / Pastries, Ice Cream, Soda (diet and regular), Sweet Tea, Candies, Chips, Cookies, Store Bought Juices, Alcohol in Excess of  1-2 drinks a day, Artificial Sweeteners, and "Sugar-free" Products. This will help patient to have stable blood glucose profile and potentially avoid unintended weight gain.  -  I encouraged her to switch to  unprocessed or minimally processed complex starch and increased protein intake (animal or plant source), fruits, and vegetables.  - she is advised to stick to a routine mealtimes to eat 3 meals  a day and avoid unnecessary snacks ( to snack only to correct hypoglycemia).   - she will be scheduled with Jearld Fenton, RDN, CDE for individualized diabetes education.  - I have approached her with the following individualized plan to manage diabetes and patient agrees:   - Given her blindness, it is very difficult to give her intensive insulin therapy. Her husband , who is already doing everything  for her, is offering to help . - The #1 priority in her diabetes treatment  would be to avoid hypoglycemia.   - I  will continue Basaglar  50 units daily at bedtime, continue prandial insulin Humalog  10 units 3 times a day before meals for pre-meal blood glucose above 90 mg/dL . - She is asked to start strict monitoring of glucose 4 times a day-before meals and at bedtime, and bring her meter and logs with her for reevaluation. - Patient is warned not to take insulin without proper monitoring per orders. -Adjustment parameters are given for hypo and hyperglycemia in writing. -Patient is encouraged to call clinic for blood glucose levels less than 70 or above 300 mg /dl. - She is benefiting from from continued glucose monitoring- the [facility] device . - I will discontinue metformin.  -Patient is not a candidate for  SGLT2 inhibitors due to CKD.  - Patient specific target  A1c;  LDL, HDL, Triglycerides, and  Waist Circumference were discussed in detail.  2) BP/HTN: Below target, she was approached for a lower dose of Lasix which she takes currently at 80 units twice a day. She refused to change the dose claiming that she will develop edema quickly. She is also low-dose losartan in metoprolol .   3) Lipids/HPL:   Controlled.   Patient is advised to continue statins. 4)  Weight/Diet: CDE Consult will be initiated , exercise, and detailed carbohydrates information provided.  5) Chronic Care/Health Maintenance:  -she  is on ACEI/ARB and Statin medications and  is encouraged to continue to follow up with Ophthalmology, Dentist,  Podiatrist at least yearly or according to recommendations, and advised to  stay away from smoking. I have recommended yearly flu vaccine and pneumonia vaccination at least every 5 years; and  sleep for at least 7 hours a day.  - Time spent with the patient: 25 min, of which >50% was spent in reviewing her blood glucose logs , discussing her hypo- and hyper-glycemic  episodes, reviewing her current and  previous labs and insulin doses and developing a plan to avoid hypo- and hyper-glycemia. Please refer to Patient Instructions for Blood Glucose Monitoring and Insulin/Medications Dosing Guide"  in media tab for additional information.  - I advised patient to maintain close follow up with Rogue Bussing, MD for primary care needs.  Follow up plan: - Return in about 4 months (around 10/23/2017) for follow up with pre-visit labs, meter, and logs.  Glade Lloyd, MD [facility] [address] [place],  [address] Phone: [phone]  Fax: [phone]    06/25/2017, 4:29 PM  This note was partially dictated with voice recognition software. Similar sounding words can be transcribed inadequately or may not  be corrected upon review.    Freestyle Libre sensor removed from upper posterior  left arm. New Libre sensor places on the pts upper posterior Right arm.

## 2017-06-25 NOTE — Patient Instructions (Signed)

## 2017-07-04 ENCOUNTER — Ambulatory Visit: Payer: Self-pay | Admitting: Pulmonary Disease

## 2017-07-09 ENCOUNTER — Encounter: Payer: Medicare Other | Attending: "Endocrinology | Admitting: Nutrition

## 2017-07-09 ENCOUNTER — Ambulatory Visit (INDEPENDENT_AMBULATORY_CARE_PROVIDER_SITE_OTHER): Payer: Medicare Other | Admitting: "Endocrinology

## 2017-07-09 VITALS — Wt 183.0 lb

## 2017-07-09 VITALS — Ht 59.0 in | Wt 184.0 lb

## 2017-07-09 DIAGNOSIS — Z713 Dietary counseling and surveillance: Secondary | ICD-10-CM | POA: Diagnosis not present

## 2017-07-09 DIAGNOSIS — N183 Chronic kidney disease, stage 3 unspecified: Secondary | ICD-10-CM

## 2017-07-09 DIAGNOSIS — E1159 Type 2 diabetes mellitus with other circulatory complications: Secondary | ICD-10-CM | POA: Insufficient documentation

## 2017-07-09 DIAGNOSIS — IMO0002 Reserved for concepts with insufficient information to code with codable children: Secondary | ICD-10-CM

## 2017-07-09 DIAGNOSIS — E1165 Type 2 diabetes mellitus with hyperglycemia: Secondary | ICD-10-CM

## 2017-07-09 DIAGNOSIS — E782 Mixed hyperlipidemia: Secondary | ICD-10-CM

## 2017-07-09 DIAGNOSIS — E1122 Type 2 diabetes mellitus with diabetic chronic kidney disease: Secondary | ICD-10-CM

## 2017-07-09 DIAGNOSIS — E669 Obesity, unspecified: Secondary | ICD-10-CM

## 2017-07-09 NOTE — Patient Instructions (Addendum)
Goals 1. Follow MY Plate 2. Eat three meals per day at times discussed. 3. Don't use Old [employer]. Only use Mrs. Dash 4. Eat more fruits and vegetables. 5. Keep drinking water Do not take insulin with meals if  BS is less than 90 mg/dl.

## 2017-07-09 NOTE — Progress Notes (Signed)
Pt brings in hernewFreestyle Libretoday.Shehas the 14day reader and sensors. Went over instructions on how to use the [place] system as she is forgetful at times. Also went over instruction on how to properly apply the sensor to herarm,. However she has vision problem and has trouble with this.She may need to come in to have this removed and have a new one placed because she has no one at home to help her with this.  Libre sensor was placed on the inside of pts upper left arm. Shevoices understanding on how to use the system and how to get BG reading for insulin injections and as needed. Shewas advised to contact the office if he has any questions or concerns.

## 2017-07-09 NOTE — Progress Notes (Signed)
Medical Nutrition Therapy:  Appt start time: 1100 end time:  1200.   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 COPD, h/o CHF, Sleep Apnea.  Lives with her husband. She is legally blind. Uses a walker and has portable oxygen. 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 BS are much better now that she has her libre. BS x 30 days avg 132 mg. Only 1 low blood sugar of 75 mg/dl blood sugar yesterday and it was right before lunch, so she ate. Testing 4 times per day.   Following sliding scale insulin schedule well. Was sometimes taking insulin after she ate if she had a lower blood sugar. Re-educated of not to take insulin before meals if BS is less than 90 mg/dl.    She notes she is feeling much better and has lot a lot of fluid. She is using Mrs. Dash. Her husband helps with cooking some.    Currently taking 40 units of Basalgar daily and 10 units of Humalog with meals.     Eating three meals per day. Not eating snacks unless BS is low. Current diet is doing much better. Needs to make sure she gets in 30-45 grams of carbs, protein and vegetables with meals. Lab Results  Component Value Date   HGBA1C 8.3 (H) 04/01/2017   CMP Latest Ref Rng & Units 06/11/2017 04/01/2017 01/08/2017  Glucose 65 - 99 mg/dL 124(H) 174(H) 240(H)  BUN 8 - 27 mg/dL 43(H) 49(H) 37(H)  Creatinine 0.57 - 1.00 mg/dL 1.69(H) 1.25(H) 1.12(H)  Sodium 134 - 144 mmol/L 146(H) 139 142  Potassium 3.5 - 5.2 mmol/L 3.5 4.0 3.8  Chloride 96 - 106 mmol/L 94(L) 90(L) 89(L)  CO2 20 - 29 mmol/L 31(H) 38(H) 32(H)  Calcium 8.7 - 10.3 mg/dL 9.3 8.9 9.4  Total Protein 6.1 - 8.1 g/dL - 7.3 -  Total Bilirubin 0.2 - 1.2 mg/dL - 0.4 -  Alkaline Phos 38 - 126 U/L - - -  AST 10 - 35 U/L - 15 -  ALT 6 - 29 U/L - 8 -     Preferred Learning Style:  Auditory and hands on   Learning Readiness:  Ready  Change in progress   MEDICATIONS:   DIETARY INTAKE:     24-hr recall:  B ( AM): Oatmeal and toast, water Snk ( AM):  L ( PM): half  sandwich RB, yogurt,  water Snk ( PM):  D ( PM): Peas, potatoes, rye bread, pork chop, water, yogurt Snk ( PM): sugar free popcycle Beverages: water  Usual physical activity: ADL   Estimated energy needs: 1200  calories 135 g carbohydrates 90 g protein 33 g fat  Progress Towards Goal(s):  In progress.   Nutritional Diagnosis:  NB-1.1 Food and nutrition-related knowledge deficit As related to Diabetes.  As evidenced by A1C.    Intervention:  Nutrition and Diabetes education provided on My Plate, CHO counting, meal planning, portion sizes, timing of meals, avoiding snacks between meals unless having a low blood sugar, target ranges for A1C and blood sugars, signs/symptoms and treatment of hyper/hypoglycemia, monitoring blood sugars, taking medications as prescribed, benefits of exercising 30 minutes per day and prevention of complications of DM.  Goals 1. Follow MY Plate 2. Eat three meals per day at times discussed. 3. Don't use [employer]. ONly use Mrs. Dash 4. Eat more fruits and vegetables. 5. Keep drinking water Do not take insulin with meals if  BS is less than 90 mg/dl.  Teaching Method Utilized:  Leisure centre manager Hands on  Handouts given during visit include:  The Plate Method   Barriers to learning/adherence to lifestyle change: none  Demonstrated degree of understanding via:  Teach Back   Monitoring/Evaluation:  Dietary intake, exercise, meal planning, SBG, and body weight in 1 month(s).

## 2017-07-14 ENCOUNTER — Other Ambulatory Visit: Payer: Self-pay | Admitting: *Deleted

## 2017-07-14 DIAGNOSIS — M5032 Other cervical disc degeneration, mid-cervical region, unspecified level: Secondary | ICD-10-CM | POA: Diagnosis not present

## 2017-07-14 DIAGNOSIS — M5136 Other intervertebral disc degeneration, lumbar region: Secondary | ICD-10-CM | POA: Diagnosis not present

## 2017-07-14 DIAGNOSIS — G894 Chronic pain syndrome: Secondary | ICD-10-CM | POA: Diagnosis not present

## 2017-07-14 DIAGNOSIS — E782 Mixed hyperlipidemia: Secondary | ICD-10-CM

## 2017-07-16 ENCOUNTER — Ambulatory Visit (INDEPENDENT_AMBULATORY_CARE_PROVIDER_SITE_OTHER): Payer: Medicare Other | Admitting: *Deleted

## 2017-07-16 ENCOUNTER — Other Ambulatory Visit: Payer: Self-pay

## 2017-07-16 DIAGNOSIS — Z5181 Encounter for therapeutic drug level monitoring: Secondary | ICD-10-CM | POA: Diagnosis not present

## 2017-07-16 DIAGNOSIS — I480 Paroxysmal atrial fibrillation: Secondary | ICD-10-CM | POA: Diagnosis not present

## 2017-07-16 LAB — POCT INR: INR: 1.8

## 2017-07-16 MED ORDER — WARFARIN SODIUM 4 MG PO TABS: ORAL_TABLET | ORAL | 4 refills | Status: DC

## 2017-07-16 NOTE — Patient Outreach (Signed)
[facility]) Care Management  07/16/2017  Cierra Rothgeb [facility] 08/23/1950 [id number]   1st telephone call to the patient for monthly assessment. No answer. HIPAA compliant message left with contact information.  Plan: RN Health Coach will make an outreach attempt to the patient in one business day.  [name] RN, BSN, [place] Direct Dial:  [phone] Fax: [phone]

## 2017-07-16 NOTE — Patient Instructions (Signed)
Take coumadin 1 1/2 tablets tonight then increase dose to 1 tablet daily except 1/2 tablet on Saturdays Recheck in 3 weeks

## 2017-07-17 ENCOUNTER — Other Ambulatory Visit: Payer: Self-pay | Admitting: "Endocrinology

## 2017-07-17 ENCOUNTER — Ambulatory Visit: Payer: Self-pay | Admitting: Pulmonary Disease

## 2017-07-17 ENCOUNTER — Other Ambulatory Visit: Payer: Self-pay | Admitting: Pulmonary Disease

## 2017-07-17 ENCOUNTER — Other Ambulatory Visit: Payer: Self-pay

## 2017-07-17 ENCOUNTER — Other Ambulatory Visit (HOSPITAL_COMMUNITY): Payer: Self-pay | Admitting: Psychiatry

## 2017-07-17 MED ORDER — CHOLECALCIFEROL 10 MCG (400 UNIT) PO TABS: 400.0000 [IU] | ORAL_TABLET | Freq: Two times a day (BID) | ORAL | 3 refills | Status: AC

## 2017-07-17 MED ORDER — MIRTAZAPINE 15 MG PO TABS: 15.0000 mg | ORAL_TABLET | Freq: Every day | ORAL | 0 refills | Status: DC

## 2017-07-17 NOTE — Patient Outreach (Signed)
[facility]) Care Management  [place]  07/17/2017   Denise Freeman 1951/03/03 [id number]  Subjective: Telephone call to the patient for monthly assessment.  HIPAA verified.  The patient states that she is doing ok.  She states that she is having pain daily from her fibromyalgia.  She rates the pain at an 8/10 and takes her medication for it.  She is adherent with her medications.  She was seen at Dr Liliane Channel office and has a new meter a freestyle Libre.  The patient states that she really likes it.  Since having her meter her blood sugars are looking better.  Her blood sugar this afternoon before meal was 14.  She has seen a nutritionist on 2/6 as well.  She is trying to eat more small meals a day. The patient has an appointment with the coumadin clinic in March and needs to schedule an appointment with her primary care.  Objective:   Encounter Medications:  Outpatient Encounter Medications as of 07/17/2017  Medication Sig Note  . albuterol (PROVENTIL) (2.5 MG/3ML) 0.083% nebulizer solution USE (1) IN NEBULIZER EVERY 6 HOURS AS NEEDED FOR SHORTNESS OF BREATH.   . cetirizine (ZYRTEC) 10 MG tablet Take 1 tablet (10 mg total) by mouth at bedtime.   . cholecalciferol (VITAMIN D) 400 units TABS tablet Take 1 tablet (400 Units total) by mouth 2 (two) times daily.   . Continuous Blood Gluc Sensor (FREESTYLE LIBRE SENSOR SYSTEM) MISC Use one sensor every 10 days.   Marland Kitchen docusate sodium (COLACE) 100 MG capsule Take 100 mg by mouth daily.   Marland Kitchen escitalopram (LEXAPRO) 10 MG tablet Take 1 tablet (10 mg total) by mouth at bedtime.   . ferrous sulfate 324 (65 Fe) MG TBEC Take 1 tablet by mouth daily.   . furosemide (LASIX) 40 MG tablet Take 2 tablets (80 mg total) by mouth 2 (two) times daily.   Marland Kitchen HUMALOG KWIKPEN 100 UNIT/ML KiwkPen INJECT 0.1 -0.6 MLS (10 -16 UNITS TOTAL) INTO THE SKIN THREE TIMES DAILY BEFORE MEALS.   Marland Kitchen ipratropium (ATROVENT) 0.02 % nebulizer solution USE (1) IN  NEBULIZER EVERY 6 HOURS AS NEEDED FOR SHORTNESS OF BREATH.   . metolazone (ZAROXOLYN) 2.5 MG tablet Take 1 tablet (2.5 mg total) by mouth daily. Take 30 minutes prior to morning LASIX   . metoprolol succinate (TOPROL-XL) 25 MG 24 hr tablet Take 1 tablet (25 mg total) by mouth 2 (two) times daily.   . mirtazapine (REMERON) 15 MG tablet Take 1 tablet (15 mg total) by mouth at bedtime.   . modafinil (PROVIGIL) 200 MG tablet Take 1 tablet (200 mg total) by mouth daily.   . mometasone (NASONEX) 50 MCG/ACT nasal spray Place 1 spray into the nose daily.   . montelukast (SINGULAIR) 10 MG tablet TAKE ONE TABLET BY MOUTH AT BEDTIME.   . mupirocin cream (BACTROBAN) 2 % Apply 1 application topically 2 (two) times daily.   . nitroGLYCERIN (NITROSTAT) 0.4 MG SL tablet Place 1 tablet (0.4 mg total) under the tongue every 5 (five) minutes as needed for chest pain.   Marland Kitchen nystatin (NYSTATIN) powder Apply topically 2 (two) times daily. Apply under breasts   . ondansetron (ZOFRAN-ODT) 4 MG disintegrating tablet Take 1 tablet (4 mg total) by mouth 2 (two) times daily as needed for nausea or vomiting.   Marland Kitchen oxyCODONE-acetaminophen (PERCOCET/ROXICET) 5-325 MG tablet Take 1 tablet by mouth every 8 (eight) hours as needed for severe pain.   . OXYGEN Inhale  3 L into the lungs continuous. 3 liters 24/7   . pantoprazole (PROTONIX) 20 MG tablet Take 1 tablet (20 mg total) by mouth 2 (two) times daily before a meal.   . PRESCRIPTION MEDICATION Inhale into the lungs See admin instructions. Use BIPAP every time laying down   . primidone (MYSOLINE) 50 MG tablet Take 1 tablet (50 mg total) by mouth at bedtime.   . Probiotic Product (PROBIOTIC PO) Take 1 tablet by mouth at bedtime.   Marland Kitchen QUEtiapine (SEROQUEL) 25 MG tablet Take 1 tablet (25 mg total) by mouth at bedtime.   . triamcinolone lotion (KENALOG) 0.1 % Apply 1 application topically 3 (three) times daily. For up to 14 days and as needed   . vitamin A 10000 UNIT capsule Take 10,000  Units by mouth every morning.   . warfarin (COUMADIN) 4 MG tablet TAKE 1 TABLET BY MOUTH DAILY EXCEPT 1/2 TABLET ON SATURDAYS   . [DISCONTINUED] fenofibrate 54 MG tablet Take 1 tablet (54 mg total) by mouth daily.   Marland Kitchen glucose blood (PRODIGY NO CODING BLOOD GLUC) test strip Use as instructed 4 x daily. E11.65   . Insulin Glargine (BASAGLAR KWIKPEN) 100 UNIT/ML SOPN INJECT 0.4 MLS (40 UNITS TOTAL) INTO THE SKIN EVERY MORNING. 07/17/2017: Patient is using a sliding scale.  . Insulin Pen Needle 32G X 4 MM MISC Use to inject insulin 5 times daily   . losartan (COZAAR) 25 MG tablet Take 0.5 tablets (12.5 mg total) by mouth daily.    No facility-administered encounter medications on file as of 07/17/2017.     Functional Status:  In your present state of health, do you have any difficulty performing the following activities: 06/11/2017 10/16/2016  Hearing? N Y  Comment - has a hearing aide, but doesn't always wear them  Vision? Y Y  Comment - wears glasses, but is legally blind  Difficulty concentrating or making decisions? Denise Freeman  Walking or climbing stairs? Y Y  Dressing or bathing? N N  Doing errands, shopping? Y N  Some recent data might be hidden    Fall/Depression Screening: Fall Risk  07/17/2017 07/09/2017 06/25/2017  Falls in the past year? No No Yes  Number falls in past yr: - - 1  Injury with Fall? - - Yes  Comment - - -  Risk Factor Category  - - High Fall Risk  Comment - - -  Risk for fall due to : - - -  Risk for fall due to: Comment - - -  Follow up - - Falls evaluation completed   PHQ 2/9 Scores 07/09/2017 06/25/2017 06/11/2017 05/09/2017 03/24/2017 12/12/2016 12/05/2016  PHQ - 2 Score 0 0 1 0 2 2 0  PHQ- 9 Score - - - - - 5 -    Assessment: Patient continues to benefit from health coach outreach for disease management and support.    THN CM Care Plan Problem One     Most Recent Value  THN Long Term Goal   IN 90 days the patient will lower her a1c by 1 to 2 points  [facility] Long Term Goal  Start Date  07/17/17  Interventions for Problem One Long Term Goal  [facility] disscused with the patient about her diabetes and diet  THN CM Short Term Goal #1   In the next 30 days the patient will verbalize that she is eating nutritional foods  THN CM Short Term Goal #1 Start Date  07/17/17  Interventions for Short Term Goal #1  RNHC review with the patient about diabetic diet  THN CM Short Term Goal #2   In 30 days the  patient will verbalize that she has written down question to ask her physician  [facility] CM Short Term Goal #2 Start Date  07/17/17  Interventions for Short Term Goal #2  [facility] discussed following up with her physician with questions she has about her health.        Plan: RN Health Coach will contact patient in the month of March and patient agrees to next outreach.  [name] RN, BSN, [place] Direct Dial:  [phone] Fax: [phone]

## 2017-07-17 NOTE — Patient Outreach (Signed)
[facility]) Care Management  [place]  07/17/2017   Denise Freeman [birth year] [id number]  Subjective:   Objective:   Encounter Medications:  Outpatient Encounter Medications as of 07/17/2017  Medication Sig Note  . albuterol (PROVENTIL) (2.5 MG/3ML) 0.083% nebulizer solution USE (1) IN NEBULIZER EVERY 6 HOURS AS NEEDED FOR SHORTNESS OF BREATH.   . cetirizine (ZYRTEC) 10 MG tablet Take 1 tablet (10 mg total) by mouth at bedtime.   . cholecalciferol (VITAMIN D) 400 units TABS tablet Take 1 tablet (400 Units total) by mouth 2 (two) times daily.   . Continuous Blood Gluc Sensor (FREESTYLE LIBRE SENSOR SYSTEM) MISC Use one sensor every 10 days.   Marland Kitchen docusate sodium (COLACE) 100 MG capsule Take 100 mg by mouth daily.   Marland Kitchen escitalopram (LEXAPRO) 10 MG tablet Take 1 tablet (10 mg total) by mouth at bedtime.   . ferrous sulfate 324 (65 Fe) MG TBEC Take 1 tablet by mouth daily.   . furosemide (LASIX) 40 MG tablet Take 2 tablets (80 mg total) by mouth 2 (two) times daily.   Marland Kitchen HUMALOG KWIKPEN 100 UNIT/ML KiwkPen INJECT 0.1 -0.6 MLS (10 -16 UNITS TOTAL) INTO THE SKIN THREE TIMES DAILY BEFORE MEALS.   Marland Kitchen ipratropium (ATROVENT) 0.02 % nebulizer solution USE (1) IN NEBULIZER EVERY 6 HOURS AS NEEDED FOR SHORTNESS OF BREATH.   . metolazone (ZAROXOLYN) 2.5 MG tablet Take 1 tablet (2.5 mg total) by mouth daily. Take 30 minutes prior to morning LASIX   . metoprolol succinate (TOPROL-XL) 25 MG 24 hr tablet Take 1 tablet (25 mg total) by mouth 2 (two) times daily.   . mirtazapine (REMERON) 15 MG tablet Take 1 tablet (15 mg total) by mouth at bedtime.   . modafinil (PROVIGIL) 200 MG tablet Take 1 tablet (200 mg total) by mouth daily.   . mometasone (NASONEX) 50 MCG/ACT nasal spray Place 1 spray into the nose daily.   . montelukast (SINGULAIR) 10 MG tablet TAKE ONE TABLET BY MOUTH AT BEDTIME.   . mupirocin cream (BACTROBAN) 2 % Apply 1 application topically 2 (two) times daily.   .  nitroGLYCERIN (NITROSTAT) 0.4 MG SL tablet Place 1 tablet (0.4 mg total) under the tongue every 5 (five) minutes as needed for chest pain.   Marland Kitchen nystatin (NYSTATIN) powder Apply topically 2 (two) times daily. Apply under breasts   . ondansetron (ZOFRAN-ODT) 4 MG disintegrating tablet Take 1 tablet (4 mg total) by mouth 2 (two) times daily as needed for nausea or vomiting.   Marland Kitchen oxyCODONE-acetaminophen (PERCOCET/ROXICET) 5-325 MG tablet Take 1 tablet by mouth every 8 (eight) hours as needed for severe pain.   . OXYGEN Inhale 3 L into the lungs continuous. 3 liters 24/7   . pantoprazole (PROTONIX) 20 MG tablet Take 1 tablet (20 mg total) by mouth 2 (two) times daily before a meal.   . PRESCRIPTION MEDICATION Inhale into the lungs See admin instructions. Use BIPAP every time laying down   . primidone (MYSOLINE) 50 MG tablet Take 1 tablet (50 mg total) by mouth at bedtime.   . Probiotic Product (PROBIOTIC PO) Take 1 tablet by mouth at bedtime.   Marland Kitchen QUEtiapine (SEROQUEL) 25 MG tablet Take 1 tablet (25 mg total) by mouth at bedtime.   . triamcinolone lotion (KENALOG) 0.1 % Apply 1 application topically 3 (three) times daily. For up to 14 days and as needed   . vitamin A 10000 UNIT capsule Take 10,000 Units by mouth every morning.   Marland Kitchen  warfarin (COUMADIN) 4 MG tablet TAKE 1 TABLET BY MOUTH DAILY EXCEPT 1/2 TABLET ON SATURDAYS   . [DISCONTINUED] fenofibrate 54 MG tablet Take 1 tablet (54 mg total) by mouth daily.   Marland Kitchen glucose blood (PRODIGY NO CODING BLOOD GLUC) test strip Use as instructed 4 x daily. E11.65   . Insulin Glargine (BASAGLAR KWIKPEN) 100 UNIT/ML SOPN INJECT 0.4 MLS (40 UNITS TOTAL) INTO THE SKIN EVERY MORNING. 07/17/2017: Patient is using a sliding scale.  . Insulin Pen Needle 32G X 4 MM MISC Use to inject insulin 5 times daily   . losartan (COZAAR) 25 MG tablet Take 0.5 tablets (12.5 mg total) by mouth daily.    No facility-administered encounter medications on file as of 07/17/2017.      Functional Status:  In your present state of health, do you have any difficulty performing the following activities: 06/11/2017 10/16/2016  Hearing? N Y  Comment - has a hearing aide, but doesn't always wear them  Vision? Y Y  Comment - wears glasses, but is legally blind  Difficulty concentrating or making decisions? [name]  Walking or climbing stairs? Y Y  Dressing or bathing? N N  Doing errands, shopping? Y N  Some recent data might be hidden    Fall/Depression Screening: Fall Risk  07/17/2017 07/09/2017 06/25/2017  Falls in the past year? No No Yes  Number falls in past yr: - - 1  Injury with Fall? - - Yes  Comment - - -  Risk Factor Category  - - High Fall Risk  Comment - - -  Risk for fall due to : - - -  Risk for fall due to: Comment - - -  Follow up - - Falls evaluation completed   PHQ 2/9 Scores 07/09/2017 06/25/2017 06/11/2017 05/09/2017 03/24/2017 12/12/2016 12/05/2016  PHQ - 2 Score 0 0 1 0 2 2 0  PHQ- 9 Score - - - - - 5 -    Assessment:   Plan:

## 2017-07-19 DIAGNOSIS — J069 Acute upper respiratory infection, unspecified: Secondary | ICD-10-CM | POA: Diagnosis not present

## 2017-07-19 DIAGNOSIS — R070 Pain in throat: Secondary | ICD-10-CM | POA: Diagnosis not present

## 2017-07-19 DIAGNOSIS — Z6837 Body mass index (BMI) 37.0-37.9, adult: Secondary | ICD-10-CM | POA: Diagnosis not present

## 2017-07-19 DIAGNOSIS — I482 Chronic atrial fibrillation: Secondary | ICD-10-CM | POA: Diagnosis not present

## 2017-07-21 ENCOUNTER — Ambulatory Visit: Payer: Self-pay | Admitting: Pulmonary Disease

## 2017-07-21 ENCOUNTER — Ambulatory Visit: Payer: Medicare Other

## 2017-07-22 ENCOUNTER — Ambulatory Visit (INDEPENDENT_AMBULATORY_CARE_PROVIDER_SITE_OTHER): Payer: Medicare Other | Admitting: "Endocrinology

## 2017-07-22 DIAGNOSIS — E1159 Type 2 diabetes mellitus with other circulatory complications: Secondary | ICD-10-CM

## 2017-07-22 NOTE — Progress Notes (Signed)
Pt brings in herFreestyle Libretoday.Shehas the 14day reader and sensors. Went over instructions on how to use the [place] system. Also went over instruction on how to properly apply the sensor to herarm. Pt brings her sensors to [place] to change because her and her husband both have vision problems and cannot see well enough to apply a new sensor.  Libre sensor was placed on the inside of pts upper right arm. Shevoices understanding on how to use the system and how to get BG reading for insulin injections and as needed. Shewas advised to contact the office if he has any questions or concerns.

## 2017-07-23 ENCOUNTER — Ambulatory Visit: Payer: Medicare Other

## 2017-07-25 ENCOUNTER — Ambulatory Visit: Payer: Self-pay | Admitting: Internal Medicine

## 2017-07-25 ENCOUNTER — Telehealth: Payer: Self-pay | Admitting: Internal Medicine

## 2017-07-25 NOTE — Telephone Encounter (Signed)
Pt is sick.  She cannot get off the commode.  She says infection is just squirting out of her. She needs some type of antibotic.  [facility] in [place]. Please advise

## 2017-07-25 NOTE — Telephone Encounter (Signed)
Called patient. She says she continues to have diarrhea and is completing a course of doxycyline. She is worried she could be bleeding, as her stools look dark. She denies feeling light-headed. She is requesting antibiotics for her "sickness." She is not vomiting. She says she was given the antibiotic and tessalon perles last week at urgent care. She said missed her appointment with me today because she is having bowel movements too often to travel from [place]. I recommended she be evaluated to make sure she is not losing blood. She plans to go to a local ED or urgent care.    [name], MD [place], PGY-3

## 2017-07-29 ENCOUNTER — Ambulatory Visit (INDEPENDENT_AMBULATORY_CARE_PROVIDER_SITE_OTHER): Payer: Medicare Other | Admitting: Internal Medicine

## 2017-07-29 ENCOUNTER — Encounter: Payer: Self-pay | Admitting: Internal Medicine

## 2017-07-29 ENCOUNTER — Other Ambulatory Visit: Payer: Self-pay

## 2017-07-29 VITALS — BP 100/60 | HR 66 | Temp 98.4°F | Wt 182.0 lb

## 2017-07-29 DIAGNOSIS — Z794 Long term (current) use of insulin: Secondary | ICD-10-CM

## 2017-07-29 DIAGNOSIS — R197 Diarrhea, unspecified: Secondary | ICD-10-CM

## 2017-07-29 DIAGNOSIS — E1122 Type 2 diabetes mellitus with diabetic chronic kidney disease: Secondary | ICD-10-CM | POA: Diagnosis not present

## 2017-07-29 DIAGNOSIS — E785 Hyperlipidemia, unspecified: Secondary | ICD-10-CM

## 2017-07-29 DIAGNOSIS — L989 Disorder of the skin and subcutaneous tissue, unspecified: Secondary | ICD-10-CM

## 2017-07-29 DIAGNOSIS — N183 Chronic kidney disease, stage 3 unspecified: Secondary | ICD-10-CM

## 2017-07-29 DIAGNOSIS — T8130XD Disruption of wound, unspecified, subsequent encounter: Secondary | ICD-10-CM

## 2017-07-29 DIAGNOSIS — N189 Chronic kidney disease, unspecified: Secondary | ICD-10-CM | POA: Diagnosis not present

## 2017-07-29 NOTE — Patient Instructions (Signed)
Ms. Hinkle,  I will refer you to wound care for your head wound.  Depending on your kidney function on labs today, I may refer you to nephrology.  I will call you with results of your stool studies. Please keep a diary of how often you are having bowel movements and what you ate or what medications you were taking.  Best, Dr. Ola Spurr

## 2017-07-29 NOTE — Assessment & Plan Note (Signed)
-   Will check lipid panel given that patient stopped her statin a few months ago.
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
## 2017-07-29 NOTE — Assessment & Plan Note (Addendum)
-   Chronic. Has had numerous antibiotic exposures to raise concern for C. diff. Could be medication side effect of fenofibrate. Do not have access to any recent imaging that supports diverticulitis diagnosis reported by patient. May need to consider vitamin deficiencies like zinc.  - Ordered stool studies (PCR, O&P, and culture). - Asked patient to keep diary of how often she is stooling and if she had eaten or taken any medication prior to episodes.  - Will check CBC to look for worsening anemia given report of dark stools.

## 2017-07-29 NOTE — Progress Notes (Signed)
[facility] Progress Note  Subjective:  Denise Freeman is a 67 y.o. female with multiple serious comorbidities (COPD on 3L O2, poorly controlled T2DM, CKD, combined CHF, afib on coumadin, HTN, HLD) who presents for scalp concern and ongoing diarrhea.    #Scalp wound: - Patient concerned about "infection" of head that has been present for about a year. Says she has seen dermatology twice (two different doctors) and had no improvement with triamcinolone ointment, mupirocin, neosporin, or other lotions. Has also tried dotting the area with dandruff shampoo.  - Notes that the strap of her CPAP machine crosses this area and has tried padding area with a towel. Says she sleeps on her side, however, so does not think there is pressure overnight. - She is very worried about infection because she feels wetness and has "drainage" daily over area. She was on doxycycline for COPD exacerbation recently and is concerned that wetness continues despite antibiotics. - Last A1c 8.3 at end of October ROS: No headaches   #Diarrhea: - Says ongoing for a couple years and got worse after gallbladder was removed last year - Denies current abdominal pain or dysuria - Says has 10-12 watery stools a day - Says she was told she has diverticulitis. Denies vomiting and is tolerating PO. - Has not had fever since the weekend.  Denise Freeman she had darker stools recently but none for at least 3 days. - Diarrhea may have been worse on recent course of doxycycline.  - Had colonoscopy and EGD in 2017 to work-up melena but no source was found; no diverticuli noted -- repeat procedures not recommended given multiple comorbidities  - She thinks she has been worked up for this but if she had stool studies it was "a long time ago" - Does not drink water at her home in [place] (says thinks is contaminated); drinks bottled water and has not traveled recently - On fenofibrate ROS: Occasional stomach cramping  #HLD: -  Of note, patient chose to come off her statin medication in December for reports of worsening memory. She thinks this has helped her and her husband greatly. - Agreeable to lipid panel being checked today  #CKD:  - Last SCr 1.69 with GFR 31 06/11/17 checked by Cardiology compared to SCr 1.25 with GFR of 45 04/01/17 checked by Endocrinology - Lasix decreased to 80 mg BID from TID in January with rise in creatinine  Allergies  Allergen Reactions  . Xarelto [Rivaroxaban] Other (See Comments)    Internal bleeding  . Ancef [Cefazolin] Nausea And Vomiting  . Levaquin [Levofloxacin In D5w] Other (See Comments)    "afib"  . Lyrica [Pregabalin] Hives  . Tamiflu [Oseltamivir Phosphate] Other (See Comments)    "water blisters"  . Zoloft [Sertraline Hcl] Other (See Comments)    Jaw problems, jittery  . Augmentin [Amoxicillin-Pot Clavulanate] Itching  . Ciprofloxacin Itching and Nausea And Vomiting  . Haldol [Haloperidol] Other (See Comments)    Restless leg  . Nsaids Diarrhea  . Penicillins Itching, Nausea And Vomiting and Rash    Has patient had a PCN reaction causing immediate rash, facial/tongue/throat swelling, SOB or lightheadedness with hypotension: Yes Has patient had a PCN reaction causing severe rash involving mucus membranes or skin necrosis: No Has patient had a PCN reaction that required hospitalization No Has patient had a PCN reaction occurring within the last 10 years: Yes If all of the above answers are "NO", then may proceed with Cephalosporin use.  . Topamax [Topiramate]  Nausea Only    Social History   Tobacco Use  . Smoking status: Former Smoker    Packs/day: 2.00    Years: 14.00    Pack years: 28.00    Types: Cigarettes    Last attempt to quit: 04/12/1989    Years since quitting: 28.3  . Smokeless tobacco: Never Used  Substance Use Topics  . Alcohol use: No    Alcohol/week: 0.0 oz    Objective: Blood pressure 100/60, pulse 66, temperature 98.4 F (36.9 C),  temperature source Oral, weight 182 lb (82.6 kg), SpO2 99 %. Body mass index is 36.76 kg/m. Constitutional: Obese female, appears older than stated age, wearing supplemental O2 HENT: MM tacky Cardiovascular: RRR, S1, S2, no m/r/g.  Pulmonary/Chest: Effort normal and breath sounds normal.  Abdominal: Soft. +BS, NT Skin: ~2 cm wound with 0.4x0.2 cm central area of increased breakdown with very superficial scab on right occiput, no fluctuance appreciated, mildly TTP near center (patient cut surrounding hair as can be seen below) Psychiatric: Somewhat anxious affect Vitals reviewed     Assessment/Plan: Scalp lesion - Appearance consistent with chronic wound. No improvement with topical antibiotic. No fluctuance to suggest abscess. Patient at risk of poor healing with history of diabetes and CKD and continued friction from CPAP strap. Precepted with Denise Freeman.  - Will refer to wound clinic. Do not think simple-wet-to-dry dressings will improve appearance given chronicity.   Diarrhea - Chronic. Has had numerous antibiotic exposures to raise concern for C. diff. Could be medication side effect of fenofibrate. Do not have access to any recent imaging that supports diverticulitis diagnosis reported by patient. May need to consider vitamin deficiencies like zinc.  - Ordered stool studies (PCR, O&P, and culture). - Asked patient to keep diary of how often she is stooling and if she had eaten or taken any medication prior to episodes.  - Will check CBC to look for worsening anemia given report of dark stools.  Dyslipidemia - Will check lipid panel given that patient stopped her statin a few months ago.  CKD (chronic kidney disease), stage III - Worsening GFR at check in January. Lasix frequency decreased. Will recheck. If still low, will refer to Nephrology.   Follow-up pending stool studies. Ordered labs requested by Endocrinology and Cardiology for patient's convenience today.   Denise Floss, MD [place], PGY-3

## 2017-07-29 NOTE — Assessment & Plan Note (Signed)
-   Appearance consistent with chronic wound. No improvement with topical antibiotic. No fluctuance to suggest abscess. Patient at risk of poor healing with history of diabetes and CKD and continued friction from CPAP strap. Precepted with Dr. Mingo Amber.  - Will refer to wound clinic. Do not think simple-wet-to-dry dressings will improve appearance given chronicity.

## 2017-07-29 NOTE — Assessment & Plan Note (Signed)
-   Worsening GFR at check in January. Lasix frequency decreased. Will recheck. If still low, will refer to Nephrology.

## 2017-07-30 LAB — MICROALBUMIN / CREATININE URINE RATIO
CREATININE, UR: 42.8 mg/dL
MICROALB/CREAT RATIO: 43.2 mg/g{creat} — AB (ref 0.0–30.0)
MICROALBUM., U, RANDOM: 18.5 ug/mL

## 2017-07-30 LAB — CBC WITH DIFFERENTIAL
BASOS ABS: 0 10*3/uL (ref 0.0–0.2)
Basos: 0 %
EOS (ABSOLUTE): 0.2 10*3/uL (ref 0.0–0.4)
Eos: 1 %
Hematocrit: 33.2 % — ABNORMAL LOW (ref 34.0–46.6)
Hemoglobin: 10.7 g/dL — ABNORMAL LOW (ref 11.1–15.9)
IMMATURE GRANS (ABS): 0 10*3/uL (ref 0.0–0.1)
IMMATURE GRANULOCYTES: 0 %
LYMPHS: 18 %
Lymphocytes Absolute: 2.3 10*3/uL (ref 0.7–3.1)
MCH: 27.2 pg (ref 26.6–33.0)
MCHC: 32.2 g/dL (ref 31.5–35.7)
MCV: 85 fL (ref 79–97)
MONOCYTES: 4 %
Monocytes Absolute: 0.5 10*3/uL (ref 0.1–0.9)
NEUTROS ABS: 9.6 10*3/uL — AB (ref 1.4–7.0)
NEUTROS PCT: 77 %
RBC: 3.93 x10E6/uL (ref 3.77–5.28)
RDW: 12.6 % (ref 12.3–15.4)
WBC: 12.6 10*3/uL — AB (ref 3.4–10.8)

## 2017-07-30 LAB — COMPREHENSIVE METABOLIC PANEL
ALBUMIN: 4.1 g/dL (ref 3.6–4.8)
ALT: 7 IU/L (ref 0–32)
AST: 15 IU/L (ref 0–40)
Albumin/Globulin Ratio: 1.5 (ref 1.2–2.2)
Alkaline Phosphatase: 100 IU/L (ref 39–117)
BILIRUBIN TOTAL: 0.3 mg/dL (ref 0.0–1.2)
BUN / CREAT RATIO: 43 — AB (ref 12–28)
BUN: 71 mg/dL — AB (ref 8–27)
CHLORIDE: 87 mmol/L — AB (ref 96–106)
CO2: 30 mmol/L — AB (ref 20–29)
CREATININE: 1.67 mg/dL — AB (ref 0.57–1.00)
Calcium: 8.7 mg/dL (ref 8.7–10.3)
GFR calc Af Amer: 36 mL/min/{1.73_m2} — ABNORMAL LOW (ref >59)
GFR calc non Af Amer: 32 mL/min/{1.73_m2} — ABNORMAL LOW (ref >59)
GLUCOSE: 204 mg/dL — AB (ref 65–99)
Globulin, Total: 2.8 g/dL (ref 1.5–4.5)
Potassium: 3.5 mmol/L (ref 3.5–5.2)
Sodium: 135 mmol/L (ref 134–144)
Total Protein: 6.9 g/dL (ref 6.0–8.5)

## 2017-07-30 LAB — LIPID PANEL
CHOL/HDL RATIO: 4.1 ratio (ref 0.0–4.4)
Cholesterol, Total: 163 mg/dL (ref 100–199)
HDL: 40 mg/dL (ref >39)
LDL Calculated: 86 mg/dL (ref 0–99)
Triglycerides: 183 mg/dL — ABNORMAL HIGH (ref 0–149)
VLDL CHOLESTEROL CAL: 37 mg/dL (ref 5–40)

## 2017-07-30 LAB — HEMOGLOBIN A1C
Est. average glucose Bld gHb Est-mCnc: 174 mg/dL
HEMOGLOBIN A1C: 7.7 % — AB (ref 4.8–5.6)

## 2017-07-31 ENCOUNTER — Encounter (HOSPITAL_COMMUNITY): Payer: Self-pay | Admitting: Psychiatry

## 2017-07-31 ENCOUNTER — Ambulatory Visit (INDEPENDENT_AMBULATORY_CARE_PROVIDER_SITE_OTHER): Payer: Medicare Other | Admitting: Psychiatry

## 2017-07-31 VITALS — BP 104/56 | HR 58 | Ht 59.0 in | Wt 182.0 lb

## 2017-07-31 DIAGNOSIS — Z818 Family history of other mental and behavioral disorders: Secondary | ICD-10-CM

## 2017-07-31 DIAGNOSIS — F0630 Mood disorder due to known physiological condition, unspecified: Secondary | ICD-10-CM

## 2017-07-31 DIAGNOSIS — Z87891 Personal history of nicotine dependence: Secondary | ICD-10-CM

## 2017-07-31 DIAGNOSIS — G4700 Insomnia, unspecified: Secondary | ICD-10-CM | POA: Diagnosis not present

## 2017-07-31 DIAGNOSIS — R413 Other amnesia: Secondary | ICD-10-CM | POA: Diagnosis not present

## 2017-07-31 MED ORDER — MIRTAZAPINE 15 MG PO TABS: 15.0000 mg | ORAL_TABLET | Freq: Every day | ORAL | 0 refills | Status: DC

## 2017-07-31 MED ORDER — ESCITALOPRAM OXALATE 10 MG PO TABS: 10.0000 mg | ORAL_TABLET | Freq: Every day | ORAL | 0 refills | Status: DC

## 2017-07-31 MED ORDER — QUETIAPINE FUMARATE 25 MG PO TABS: 25.0000 mg | ORAL_TABLET | Freq: Every day | ORAL | 0 refills | Status: DC

## 2017-07-31 NOTE — Patient Instructions (Signed)
1. Continue lexapro 10 mg at night 2. Continue mirtazapine 15 mg at night 3. Continue quetiapine 25 mg at night 4.Return to clinic in three months for 15 mins

## 2017-08-01 ENCOUNTER — Other Ambulatory Visit: Payer: Self-pay | Admitting: Internal Medicine

## 2017-08-01 DIAGNOSIS — N183 Chronic kidney disease, stage 3 unspecified: Secondary | ICD-10-CM

## 2017-08-04 ENCOUNTER — Ambulatory Visit (INDEPENDENT_AMBULATORY_CARE_PROVIDER_SITE_OTHER): Payer: Medicare Other | Admitting: "Endocrinology

## 2017-08-04 DIAGNOSIS — E1165 Type 2 diabetes mellitus with hyperglycemia: Secondary | ICD-10-CM

## 2017-08-05 DIAGNOSIS — R197 Diarrhea, unspecified: Secondary | ICD-10-CM | POA: Diagnosis not present

## 2017-08-07 NOTE — Progress Notes (Signed)
Pt brings in her Freestyle Libretoday.Shehas the 14day reader and sensors. Verified correct use of the [place] system. Pt is unable to change sensor at home due to vision problems. Libre sensor was placed on the inside of pts upper right arm. Shevoices understanding on how to use the system and how to get BG reading for insulin injections and as needed. Shewas advised to contact the office if he has any questions or concerns.

## 2017-08-11 ENCOUNTER — Encounter: Payer: Medicare Other | Attending: "Endocrinology | Admitting: Nutrition

## 2017-08-11 ENCOUNTER — Encounter: Payer: Self-pay | Admitting: "Endocrinology

## 2017-08-11 ENCOUNTER — Ambulatory Visit (INDEPENDENT_AMBULATORY_CARE_PROVIDER_SITE_OTHER): Payer: Medicare Other | Admitting: "Endocrinology

## 2017-08-11 ENCOUNTER — Ambulatory Visit: Payer: Self-pay | Admitting: Nutrition

## 2017-08-11 ENCOUNTER — Ambulatory Visit: Payer: Self-pay | Admitting: Pharmacist

## 2017-08-11 VITALS — BP 106/62 | HR 76 | Ht 59.0 in | Wt 183.0 lb

## 2017-08-11 VITALS — Wt 183.0 lb

## 2017-08-11 DIAGNOSIS — I10 Essential (primary) hypertension: Secondary | ICD-10-CM

## 2017-08-11 DIAGNOSIS — E782 Mixed hyperlipidemia: Secondary | ICD-10-CM

## 2017-08-11 DIAGNOSIS — Z713 Dietary counseling and surveillance: Secondary | ICD-10-CM | POA: Insufficient documentation

## 2017-08-11 DIAGNOSIS — E1159 Type 2 diabetes mellitus with other circulatory complications: Secondary | ICD-10-CM | POA: Diagnosis not present

## 2017-08-11 DIAGNOSIS — E1165 Type 2 diabetes mellitus with hyperglycemia: Secondary | ICD-10-CM

## 2017-08-11 DIAGNOSIS — E669 Obesity, unspecified: Secondary | ICD-10-CM

## 2017-08-11 DIAGNOSIS — E118 Type 2 diabetes mellitus with unspecified complications: Secondary | ICD-10-CM

## 2017-08-11 DIAGNOSIS — IMO0002 Reserved for concepts with insufficient information to code with codable children: Secondary | ICD-10-CM

## 2017-08-11 NOTE — Progress Notes (Signed)
Pt brings in her Freestyle Libretoday.Shehas the 14day reader and sensors. Verified correct use of the [place] system. Pt is unable to change sensor at home due to vision problems. Libre sensor was placed on the inside of pts upper right arm. Shevoices understanding on how to use the system and how to get BG reading for insulin injections and as needed. Shewas advised to contact the office if he has any questions or concerns.

## 2017-08-11 NOTE — Progress Notes (Signed)
Medical Nutrition Therapy:  Appt start time: 1400 end time:  1430.   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 COPD, h/o CHF, Sleep Apnea.  Lives with her husband. She is legally blind. Uses a walker and has portable oxygen. 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 BS are much better now that she has her libre. BS x 30 days avg 132 mg. Only 1 low blood sugar of 75 mg/dl blood sugar yesterday and it was right before lunch, so she ate. Testing 4 times per day.   Following sliding scale insulin schedule well. Was sometimes taking insulin after she ate if she had a lower blood sugar. Re-educated of not to take insulin before meals if BS is less than 90 mg/dl.    She notes she is feeling much better and has lot a lot of fluid. She is using Mrs. Dash. Her husband helps with cooking some.    Currently taking 40 units of Basalgar daily and 10 units of Humalog with meals.     Eating three meals per day. Not eating snacks unless BS is low. Current diet is doing much better. Needs to make sure she gets in 30-45 grams of carbs, protein and vegetables with meals. Lab Results  Component Value Date   HGBA1C 7.7 (H) 07/29/2017   CMP Latest Ref Rng & Units 07/29/2017 06/11/2017 04/01/2017  Glucose 65 - 99 mg/dL 204(H) 124(H) 174(H)  BUN 8 - 27 mg/dL 71(H) 43(H) 49(H)  Creatinine 0.57 - 1.00 mg/dL 1.67(H) 1.69(H) 1.25(H)  Sodium 134 - 144 mmol/L 135 146(H) 139  Potassium 3.5 - 5.2 mmol/L 3.5 3.5 4.0  Chloride 96 - 106 mmol/L 87(L) 94(L) 90(L)  CO2 20 - 29 mmol/L 30(H) 31(H) 38(H)  Calcium 8.7 - 10.3 mg/dL 8.7 9.3 8.9  Total Protein 6.0 - 8.5 g/dL 6.9 - 7.3  Total Bilirubin 0.0 - 1.2 mg/dL 0.3 - 0.4  Alkaline Phos 39 - 117 IU/L 100 - -  AST 0 - 40 IU/L 15 - 15  ALT 0 - 32 IU/L 7 - 8     Preferred Learning Style:  Auditory and hands on   Learning Readiness:  Ready  Change in progress   MEDICATIONS:   DIETARY INTAKE:     24-hr recall:  B ( AM): Special K or oatmeal and Snk ( AM):  L (  PM): half sandwich chicken or tuna fish or PB and  Fruit.,  water Snk ( PM):  D ( PM): Peas, potatoes, rye bread, pork chop, water, yogurt Snk ( PM): sugar free popcycle Beverages: water  Usual physical activity: ADL   Estimated energy needs: 1200  calories 135 g carbohydrates 90 g protein 33 g fat  Progress Towards Goal(s):  In progress.   Nutritional Diagnosis:  NB-1.1 Food and nutrition-related knowledge deficit As related to Diabetes.  As evidenced by A1C.    Intervention:  Nutrition and Diabetes education provided on My Plate, CHO counting, meal planning, portion sizes, timing of meals, avoiding snacks between meals unless having a low blood sugar, target ranges for A1C and blood sugars, signs/symptoms and treatment of hyper/hypoglycemia, monitoring blood sugars, taking medications as prescribed, benefits of exercising 30 minutes per day and prevention of complications of DM.  Goals 1. Eat 2 eggs a week 2. COntinue to eat more fresh fruits and vegetables. 3. Keep drinking water 4. Eat 1 piece of fruit with each meal.   Teaching Method Utilized:  Visual Large Print Auditory Hands on  Handouts given during visit include:  The Plate Method   Barriers to learning/adherence to lifestyle change: none  Demonstrated degree of understanding via:  Teach Back   Monitoring/Evaluation:  Dietary intake, exercise, meal planning, SBG, and body weight in 3 month(s).

## 2017-08-11 NOTE — Progress Notes (Signed)
Consult Note       08/11/2017, 11:58 AM   Subjective:    Patient ID: Denise Freeman, female    DOB: 28-Nov-1950.  she is being seen in consultation for management of currently uncontrolled symptomatic diabetes requested by  Rogue Bussing, MD.   Past Medical History:  Diagnosis Date  . Allergy   . Anemia   . Anxiety   . Asthma   . Atrial fibrillation ([place])   . Bipolar 1 disorder ([place])   . 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 Cholelithiasis    a. 09/2016 s/p Lap Chole.  . Chronic bronchitis ([place])   . Chronic combined systolic and diastolic congestive heart failure ([place])    a. 09/2016 Echo: EF 30-35%.  . Colon polyps   . COPD (chronic obstructive pulmonary disease) ([place])   . Depression   . Family history of adverse reaction to anesthesia    Uncle was positive for malignant hyperthermia; patient had testing done and was negative.  . Fibromyalgia   . GERD (gastroesophageal reflux disease)   . Gout   . High cholesterol   . History of blood transfusion 06/2015   "bleeding from my rectum"  . History of hiatal hernia   . HOH (hard of hearing)   . Hx of colonic polyps 03/21/2016   3 small adenomas no recall - co-morbidities  . Neuropathy    Disc Back   . NICM (nonischemic cardiomyopathy) ([place])    a. Previously worked up in [place], MD-->low EF with subsequent recovery.  Multiple caths (last ~ 2014 per pt report)--reportedly nl cors;  b. 09/2016 Echo: EF 30-35%, antsept/apical HK, mild MR, mildly dil LA, mod dil RA;  c. 09/2016 Lexi MV: EF 26%, glob HK, sept DK, med size, mod intensity fixed septal defect - BBB/PVC related artifact, no ischemia.  . On home oxygen therapy    "3L; 24/7" (03/14/2016)  . OSA treated with BiPAP    uses biPAP, 10 (03/14/2016)  . Osteoarthritis   . Oxygen deficiency   . Pneumonia   . Type II diabetes mellitus ([place])    Past Surgical History:   Procedure Laterality Date  . APPENDECTOMY     "they busted"  . bladder stimulator     pt states, "it cannot be turned off; it's in my right hip; dead battery so it's not working anymore". (03/14/2016)  . BLADDER SUSPENSION     2003, 2006 and 2010  . CATARACT EXTRACTION W/PHACO Right 11/29/2014   Procedure: CATARACT EXTRACTION PHACO AND INTRAOCULAR LENS PLACEMENT (IOC);  Surgeon: Rutherford Guys, MD;  Location: [facility];  Service: Ophthalmology;  Laterality: Right;  CDE:3.81  . CATARACT EXTRACTION W/PHACO Left 12/13/2014   Procedure: CATARACT EXTRACTION PHACO AND INTRAOCULAR LENS PLACEMENT (IOC);  Surgeon: Rutherford Guys, MD;  Location: [facility];  Service: Ophthalmology;  Laterality: Left;  CDE:6.59  . CERVICAL DISC SURGERY N/A 2009   4, 6, and 7 cervical disc replaced  . CHOLECYSTECTOMY N/A 09/13/2016   Procedure: LAPAROSCOPIC CHOLECYSTECTOMY;  Surgeon: Rolm Bookbinder, MD;  Location: [facility];  Service: General;  Laterality: N/A;  . COLONOSCOPY  WITH PROPOFOL N/A 03/15/2016   Procedure: COLONOSCOPY WITH PROPOFOL;  Surgeon: Gatha Mayer, MD;  Location: [facility];  Service: Endoscopy;  Laterality: N/A;  . ESOPHAGOGASTRODUODENOSCOPY (EGD) WITH PROPOFOL N/A 03/15/2016   Procedure: ESOPHAGOGASTRODUODENOSCOPY (EGD) WITH PROPOFOL;  Surgeon: Gatha Mayer, MD;  Location: [facility];  Service: Endoscopy;  Laterality: N/A;  . HEEL SPUR SURGERY Bilateral   . HERNIA REPAIR    . I&D EXTREMITY Right 06/13/2015   Procedure: MINOR IRRIGATION AND DEBRIDEMENT EXTREMITY REMOVAL OF NAIL;  Surgeon: Daryll Brod, MD;  Location: [facility];  Service: Orthopedics;  Laterality: Right;  . TUBAL LIGATION    . UMBILICAL HERNIA REPAIR     w/mesh   Social History   Socioeconomic History  . Marital status: Married    Spouse name: None  . Number of children: 1  . Years of education: None  . Highest education level: None  Social Needs  . Financial resource strain: None  . Food insecurity - worry: None   . Food insecurity - inability: None  . Transportation needs - medical: None  . Transportation needs - non-medical: None  Occupational History  . Occupation: house wife  Tobacco Use  . Smoking status: Former Smoker    Packs/day: 2.00    Years: 14.00    Pack years: 28.00    Types: Cigarettes    Last attempt to quit: 04/12/1989    Years since quitting: 28.3  . Smokeless tobacco: Never Used  Substance and Sexual Activity  . Alcohol use: No    Alcohol/week: 0.0 oz  . Drug use: No  . Sexual activity: No  Other Topics Concern  . None  Social History Narrative   Married   Disabled   1 child; 67 yrs   6 pregnancies   1 child   Outpatient Encounter Medications as of 08/11/2017  Medication Sig  . albuterol (PROVENTIL) (2.5 MG/3ML) 0.083% nebulizer solution USE (1) IN NEBULIZER EVERY 6 HOURS AS NEEDED FOR SHORTNESS OF BREATH.  . cetirizine (ZYRTEC) 10 MG tablet Take 1 tablet (10 mg total) by mouth at bedtime.  . cholecalciferol (VITAMIN D) 400 units TABS tablet Take 1 tablet (400 Units total) by mouth 2 (two) times daily.  . Continuous Blood Gluc Sensor (FREESTYLE LIBRE SENSOR SYSTEM) MISC Use one sensor every 10 days.  Marland Kitchen docusate sodium (COLACE) 100 MG capsule Take 100 mg by mouth daily.  Marland Kitchen escitalopram (LEXAPRO) 10 MG tablet Take 1 tablet (10 mg total) by mouth at bedtime.  . fenofibrate 54 MG tablet TAKE 1 TABLET BY MOUTH ONCE DAILY.  . ferrous sulfate 324 (65 Fe) MG TBEC Take 1 tablet by mouth daily.  . furosemide (LASIX) 40 MG tablet Take 2 tablets (80 mg total) by mouth 2 (two) times daily.  Marland Kitchen glucose blood (PRODIGY NO CODING BLOOD GLUC) test strip Use as instructed 4 x daily. E11.65  . HUMALOG KWIKPEN 100 UNIT/ML KiwkPen INJECT 0.1 -0.6 MLS (10 -16 UNITS TOTAL) INTO THE SKIN THREE TIMES DAILY BEFORE MEALS.  Marland Kitchen Insulin Glargine (BASAGLAR KWIKPEN) 100 UNIT/ML SOPN INJECT 0.4 MLS (40 UNITS TOTAL) INTO THE SKIN EVERY MORNING. (Patient taking differently: INJECT 0.5 MLS (50 UNITS  TOTAL) INTO THE SKIN EVERY MORNING.)  . Insulin Pen Needle 32G X 4 MM MISC Use to inject insulin 5 times daily  . ipratropium (ATROVENT) 0.02 % nebulizer solution USE (1) IN NEBULIZER EVERY 6 HOURS AS NEEDED FOR SHORTNESS OF BREATH.  Marland Kitchen losartan (COZAAR) 25 MG tablet Take 0.5  tablets (12.5 mg total) by mouth daily.  . metolazone (ZAROXOLYN) 2.5 MG tablet Take 1 tablet (2.5 mg total) by mouth daily. Take 30 minutes prior to morning LASIX  . metoprolol succinate (TOPROL-XL) 25 MG 24 hr tablet Take 1 tablet (25 mg total) by mouth 2 (two) times daily.  . mirtazapine (REMERON) 15 MG tablet Take 1 tablet (15 mg total) by mouth at bedtime.  . modafinil (PROVIGIL) 200 MG tablet Take 1 tablet (200 mg total) by mouth daily.  . mometasone (NASONEX) 50 MCG/ACT nasal spray Place 1 spray into the nose daily.  . montelukast (SINGULAIR) 10 MG tablet TAKE ONE TABLET BY MOUTH AT BEDTIME.  . mupirocin cream (BACTROBAN) 2 % Apply 1 application topically 2 (two) times daily.  . nitroGLYCERIN (NITROSTAT) 0.4 MG SL tablet Place 1 tablet (0.4 mg total) under the tongue every 5 (five) minutes as needed for chest pain.  Marland Kitchen nystatin (NYSTATIN) powder Apply topically 2 (two) times daily. Apply under breasts  . ondansetron (ZOFRAN-ODT) 4 MG disintegrating tablet Take 1 tablet (4 mg total) by mouth 2 (two) times daily as needed for nausea or vomiting.  Marland Kitchen oxyCODONE-acetaminophen (PERCOCET/ROXICET) 5-325 MG tablet Take 1 tablet by mouth every 8 (eight) hours as needed for severe pain.  . OXYGEN Inhale 3 L into the lungs continuous. 3 liters 24/7  . pantoprazole (PROTONIX) 20 MG tablet Take 1 tablet (20 mg total) by mouth 2 (two) times daily before a meal.  . PRESCRIPTION MEDICATION Inhale into the lungs See admin instructions. Use BIPAP every time laying down  . primidone (MYSOLINE) 50 MG tablet Take 1 tablet (50 mg total) by mouth at bedtime.  . Probiotic Product (PROBIOTIC PO) Take 1 tablet by mouth at bedtime.  Marland Kitchen QUEtiapine  (SEROQUEL) 25 MG tablet Take 1 tablet (25 mg total) by mouth at bedtime.  . triamcinolone lotion (KENALOG) 0.1 % Apply 1 application topically 3 (three) times daily. For up to 14 days and as needed  . vitamin A 10000 UNIT capsule Take 10,000 Units by mouth every morning.  . warfarin (COUMADIN) 4 MG tablet TAKE 1 TABLET BY MOUTH DAILY EXCEPT 1/2 TABLET ON SATURDAYS   No facility-administered encounter medications on file as of 08/11/2017.     ALLERGIES: Allergies  Allergen Reactions  . Xarelto [Rivaroxaban] Other (See Comments)    Internal bleeding  . Ancef [Cefazolin] Nausea And Vomiting  . Levaquin [Levofloxacin In D5w] Other (See Comments)    "afib"  . Lyrica [Pregabalin] Hives  . Tamiflu [Oseltamivir Phosphate] Other (See Comments)    "water blisters"  . Zoloft [Sertraline Hcl] Other (See Comments)    Jaw problems, jittery  . Augmentin [Amoxicillin-Pot Clavulanate] Itching  . Ciprofloxacin Itching and Nausea And Vomiting  . Haldol [Haloperidol] Other (See Comments)    Restless leg  . Nsaids Diarrhea  . Penicillins Itching, Nausea And Vomiting and Rash    Has patient had a PCN reaction causing immediate rash, facial/tongue/throat swelling, SOB or lightheadedness with hypotension: Yes Has patient had a PCN reaction causing severe rash involving mucus membranes or skin necrosis: No Has patient had a PCN reaction that required hospitalization No Has patient had a PCN reaction occurring within the last 10 years: Yes If all of the above answers are "NO", then may proceed with Cephalosporin use.  . Topamax [Topiramate] Nausea Only    VACCINATION STATUS: Immunization History  Administered Date(s) Administered  . Influenza Split 03/03/2014  . Influenza,inj,Quad PF,6+ Mos 01/30/2016, 01/27/2017  . Influenza-Unspecified 02/06/2015  .  Pneumococcal Conjugate-13 01/30/2016  . Pneumococcal Polysaccharide-23 11/24/2016  . Tetanus 06/03/2013    Diabetes  She presents for her follow-up  diabetic visit. She has type 2 diabetes mellitus. Onset time: She was diagnosed at approximate age of 8 years. Her disease course has been improving. There are no hypoglycemic associated symptoms. Pertinent negatives for hypoglycemia include no confusion, headaches, pallor or seizures. Associated symptoms include fatigue, polydipsia, polyphagia and visual change. Pertinent negatives for diabetes include no chest pain and no polyuria. There are no hypoglycemic complications. Symptoms are improving. Diabetic complications include heart disease, nephropathy, peripheral neuropathy and retinopathy. Risk factors for coronary artery disease include family history, dyslipidemia, diabetes mellitus, hypertension, obesity, post-menopausal, sedentary lifestyle and tobacco exposure. Current diabetic treatment includes insulin injections and oral agent (monotherapy) (She is currently on metformin 500 mg by mouth twice a day, Basaglar 40 units every bedtime , Humalog 10 units 3 times a day before meals.). Her weight is decreasing steadily. She is following a generally unhealthy diet. When asked about meal planning, she reported none. She has not had a previous visit with a dietitian. She never participates in exercise. Her breakfast blood glucose range is generally 180-200 mg/dl. Her lunch blood glucose range is generally 180-200 mg/dl. Her dinner blood glucose range is generally 180-200 mg/dl. Her bedtime blood glucose range is generally 180-200 mg/dl. Her overall blood glucose range is 180-200 mg/dl. An ACE inhibitor/angiotensin II receptor blocker is being taken. She does not see a podiatrist.Eye exam is current (She is legally blind on both eyes reportedly unrelated to her diabetes.).  Hyperlipidemia  This is a chronic problem. The current episode started more than 1 year ago. The problem is controlled. Exacerbating diseases include diabetes, hypothyroidism and obesity. Pertinent negatives include no chest pain, myalgias or  shortness of breath. Current antihyperlipidemic treatment includes statins and fibric acid derivatives. Risk factors for coronary artery disease include diabetes mellitus, dyslipidemia, family history, hypertension, obesity, a sedentary lifestyle and post-menopausal.  Hypertension  This is a chronic problem. The current episode started more than 1 year ago. Pertinent negatives include no chest pain, headaches, palpitations or shortness of breath. Risk factors for coronary artery disease include dyslipidemia, diabetes mellitus, obesity, sedentary lifestyle and smoking/tobacco exposure. Past treatments include angiotensin blockers and diuretics. Hypertensive end-organ damage includes retinopathy.     Review of Systems  Constitutional: Positive for fatigue. Negative for chills, fever and unexpected weight change.  HENT: Negative for trouble swallowing and voice change.   Eyes: Negative for visual disturbance.  Respiratory: Negative for cough, shortness of breath and wheezing.   Cardiovascular: Negative for chest pain, palpitations and leg swelling.  Gastrointestinal: Negative for diarrhea, nausea and vomiting.  Endocrine: Positive for polydipsia and polyphagia. Negative for cold intolerance, heat intolerance and polyuria.  Musculoskeletal: Positive for gait problem. Negative for arthralgias and myalgias.       Patient uses a specialized walker due to deconditioning, disequilibrium, blindness.  Skin: Negative for color change, pallor, rash and wound.  Neurological: Negative for seizures and headaches.  Psychiatric/Behavioral: Negative for confusion and suicidal ideas.    Objective:    BP 106/62   Pulse 76   Ht 4\' 11"  (1.499 m)   Wt 183 lb (83 kg)   BMI 36.96 kg/m   Wt Readings from Last 3 Encounters:  08/11/17 183 lb (83 kg)  07/29/17 182 lb (82.6 kg)  07/09/17 183 lb (83 kg)     Physical Exam  Constitutional: She is oriented to person, place, and time.  She appears well-developed.   HENT:  Head: Normocephalic and atraumatic.  Eyes: EOM are normal.  Neck: Normal range of motion. Neck supple. No tracheal deviation present. No thyromegaly present.  Cardiovascular: Normal rate and regular rhythm.  Pulmonary/Chest: Effort normal and breath sounds normal.  Abdominal: Soft. Bowel sounds are normal. There is no tenderness. There is no guarding.  Musculoskeletal: She exhibits no edema.  She uses a specialized walker to ambulate due to deconditioning, disequilibrium, blindness.  Neurological: She is alert and oriented to person, place, and time. She has normal reflexes. No cranial nerve deficit. Coordination normal.  Hard of hearing.  Skin: Skin is warm and dry. No rash noted. No erythema. No pallor.  Psychiatric: She has a normal mood and affect. Judgment normal.    CMP     Component Value Date/Time   NA 135 07/29/2017 1115   K 3.5 07/29/2017 1115   CL 87 (L) 07/29/2017 1115   CO2 30 (H) 07/29/2017 1115   GLUCOSE 204 (H) 07/29/2017 1115   GLUCOSE 174 (H) 04/01/2017 1135   BUN 71 (H) 07/29/2017 1115   CREATININE 1.67 (H) 07/29/2017 1115   CREATININE 1.25 (H) 04/01/2017 1135   CALCIUM 8.7 07/29/2017 1115   PROT 6.9 07/29/2017 1115   ALBUMIN 4.1 07/29/2017 1115   AST 15 07/29/2017 1115   ALT 7 07/29/2017 1115   ALKPHOS 100 07/29/2017 1115   BILITOT 0.3 07/29/2017 1115   GFRNONAA 32 (L) 07/29/2017 1115   GFRNONAA 45 (L) 04/01/2017 1135   GFRAA 36 (L) 07/29/2017 1115   GFRAA 52 (L) 04/01/2017 1135     Diabetic Labs (most recent): Lab Results  Component Value Date   HGBA1C 7.7 (H) 07/29/2017   HGBA1C 8.3 (H) 04/01/2017   HGBA1C 7.9 01/27/2017     Lipid Panel ( most recent) Lipid Panel     Component Value Date/Time   CHOL 163 07/29/2017 1115   TRIG 183 (H) 07/29/2017 1115   HDL 40 07/29/2017 1115   CHOLHDL 4.1 07/29/2017 1115   CHOLHDL 6.2 09/10/2016 1147   VLDL 30 09/10/2016 1147   LDLCALC 86 07/29/2017 1115   LDLDIRECT 153.0 04/19/2016 0932       Assessment & Plan:   1. DM type 2 causing vascular disease , Stage 3 renal insufficiency, blindness - Patient has currently uncontrolled symptomatic type 2 DM since  67 years of age. -Wearing her Freestyle libre CGM device, she came with more consistent, slightly above target blood glucose readings. More importantly, she did not have any hypoglycemia. -Her A1c is improving to 7.7% from 8.3%.  -her diabetes is complicated by coronary artery disease, exercise information, stage 3 renal insufficiency, blindness and Keeya L Jorgensen remains at a high risk for more acute and chronic complications which include CAD, CVA, CKD, retinopathy, and neuropathy. These are all discussed in detail with the patient.  - I have counseled her on diet management and weight loss, by adopting a carbohydrate restricted/protein rich diet.  -  Suggestion is made for her to avoid simple carbohydrates  from her diet including Cakes, Sweet Desserts / Pastries, Ice Cream, Soda (diet and regular), Sweet Tea, Candies, Chips, Cookies, Store Bought Juices, Alcohol in Excess of  1-2 drinks a day, Artificial Sweeteners, and "Sugar-free" Products. This will help patient to have stable blood glucose profile and potentially avoid unintended weight gain.   - I encouraged her to switch to  unprocessed or minimally processed complex starch and increased protein intake (animal or plant source),  fruits, and vegetables.  - she is advised to stick to a routine mealtimes to eat 3 meals  a day and avoid unnecessary snacks ( to snack only to correct hypoglycemia).   - she will be scheduled with Jearld Fenton, RDN, CDE for individualized diabetes education.  - I have approached her with the following individualized plan to manage diabetes and patient agrees:   - Given her blindness, it is very difficult to give her intensive insulin therapy. Her husband , who is already doing everything for her, is offering to help . - The #1 priority in her  diabetes treatment  would be to avoid hypoglycemia.   - I  Will continue her basal insulin Basaglar to 50 units daily at bedtime, continue prandial insulin Humalog  10 units 3 times a day before meals for pre-meal blood glucose above 90 mg/dL . - She is wearing her CGM device all the time.    - Patient is warned not to take insulin without proper monitoring per orders. -Adjustment parameters are given for hypo and hyperglycemia in writing. -Patient is encouraged to call clinic for blood glucose levels less than 70 or above 300 mg /dl.  - I will continue  low-dose metformin 500 mg by mouth twice a day, therapeutically suitable for patient .  -Patient is not a candidate for  SGLT2 inhibitors due to CKD.  - Patient specific target  A1c;  LDL, HDL, Triglycerides, and  Waist Circumference were discussed in detail.  2) BP/HTN: Blood pressure is on target today.   She is also low-dose losartan in metoprolol .  3) Lipids/HPL:   Controlled.   Patient is advised to continue statins. 4)  Weight/Diet: CDE Consult will be initiated , exercise, and detailed carbohydrates information provided.  5) Chronic Care/Health Maintenance:  -she  is on ACEI/ARB and Statin medications and  is encouraged to continue to follow up with Ophthalmology, Dentist,  Podiatrist at least yearly or according to recommendations, and advised to  stay away from smoking. I have recommended yearly flu vaccine and pneumonia vaccination at least every 5 years; and  sleep for at least 7 hours a day.  - Time spent with the patient: 25 min, of which >50% was spent in reviewing her blood glucose logs , discussing her hypo- and hyper-glycemic episodes, reviewing her current and  previous labs and insulin doses and developing a plan to avoid hypo- and hyper-glycemia. Please refer to Patient Instructions for Blood Glucose Monitoring and Insulin/Medications Dosing Guide"  in media tab for additional information.   - I advised patient to  maintain close follow up with Rogue Bussing, MD for primary care needs.  Follow up plan: - Return in about 3 months (around 11/11/2017) for meter, and logs.  Glade Lloyd, MD [facility] [address] [place], [place] [address] Phone: [phone]  Fax: [phone]    08/11/2017, 11:58 AM  This note was partially dictated with voice recognition software. Similar sounding words can be transcribed inadequately or may not  be corrected upon review.

## 2017-08-11 NOTE — Patient Instructions (Signed)

## 2017-08-11 NOTE — Patient Instructions (Addendum)
Goals 1. Eat 2 eggs a week 2. COntinue to eat more fresh fruits and vegetables. 3. Keep drinking water 4. Eat 1 piece of fruit with each meal.

## 2017-08-12 ENCOUNTER — Other Ambulatory Visit: Payer: Self-pay

## 2017-08-12 DIAGNOSIS — G894 Chronic pain syndrome: Secondary | ICD-10-CM | POA: Diagnosis not present

## 2017-08-12 DIAGNOSIS — M5136 Other intervertebral disc degeneration, lumbar region: Secondary | ICD-10-CM | POA: Diagnosis not present

## 2017-08-12 NOTE — Patient Outreach (Signed)
[place] [facility]) Care Management  08/12/2017  Denise Freeman [facility] Denise Freeman [phone]   1st outreach attempt to the patient for monthly assessment.  No answer. HIPAA compliant voice mail left with contact information.  Plan:  [facility] will make an outreach attempt to the patient in one business day.  Denise Arms RN, BSN, [place] Direct Dial:  [phone] Fax: [phone]

## 2017-08-13 ENCOUNTER — Encounter: Payer: Self-pay | Admitting: Nutrition

## 2017-08-13 ENCOUNTER — Other Ambulatory Visit: Payer: Self-pay | Admitting: "Endocrinology

## 2017-08-13 ENCOUNTER — Other Ambulatory Visit: Payer: Self-pay | Admitting: Internal Medicine

## 2017-08-13 ENCOUNTER — Telehealth: Payer: Self-pay | Admitting: Internal Medicine

## 2017-08-13 ENCOUNTER — Other Ambulatory Visit: Payer: Self-pay

## 2017-08-13 ENCOUNTER — Other Ambulatory Visit: Payer: Self-pay | Admitting: Pharmacist

## 2017-08-13 DIAGNOSIS — N183 Chronic kidney disease, stage 3 unspecified: Secondary | ICD-10-CM

## 2017-08-13 DIAGNOSIS — K219 Gastro-esophageal reflux disease without esophagitis: Secondary | ICD-10-CM

## 2017-08-13 NOTE — Telephone Encounter (Signed)
Pt would like results from stool sample.  She hasnt received information about referral to kidney dr.  She also has spot on her head and her shoulder that hasnt healed.  Please advise

## 2017-08-13 NOTE — Patient Outreach (Signed)
[facility]) Care Management  08/13/2017  Denise Freeman [facility] 1950/07/12 [id number]   Called patient regarding medication assistance. Unfortunately, she did not answer her phone. HIPAA compliant message was left for her.   Plan:  I will call patient back tomorrow per protocol.   [name], PharmD, [place] Clinical Pharmacist [phone]

## 2017-08-13 NOTE — Telephone Encounter (Signed)
Please let patient know I do not have the results for stool sample yet. It can take a week or more for these results to be processed. Does she know which [facility] she [facility] so that I could look into it?  Please let her know I re-ordered the nephrology referral.   Has she been able to see the wound clinic yet for her head?  Thank you, [facility]

## 2017-08-13 NOTE — Patient Outreach (Signed)
[facility] Freeman) Care Management  [place]  08/13/2017   Denise Freeman 1950/12/09 [id number]  Subjective: Successful outreach call to the patient for monthly assessment. HIPAA verified. The patient states that she is doing ok.   She states that she has chronic pain from her fibromyalgia and her knees that she rates a 8/10.  She denies any falls.  The patient had an appointment with Dr. Dorris Fetch and the nutritionist on 08-11-2017 and her a1c has come down from a 8 to 7.7.   She is continuing to monitor her food and fluid intake.  She did not check her blood sugar this morning but yesterday her fasting blood sugar 123, before dinner 99 and at bedtime 160. The patient has switched glucometer to the [place].  She states that it helps her control her blood sugars better.  The patient states that she is adherent with her medications. The patient has an appointment with Dr. Dorris Fetch and the nutritionist in six months.  She is not sure of the exact day.    Encounter Medications:  Outpatient Encounter Medications as of 08/13/2017  Medication Sig Note  . albuterol (PROVENTIL) (2.5 MG/3ML) 0.083% nebulizer solution USE (1) IN NEBULIZER EVERY 6 HOURS AS NEEDED FOR SHORTNESS OF BREATH.   . cetirizine (ZYRTEC) 10 MG tablet Take 1 tablet (10 mg total) by mouth at bedtime.   . cholecalciferol (VITAMIN D) 400 units TABS tablet Take 1 tablet (400 Units total) by mouth 2 (two) times daily.   . Continuous Blood Gluc Sensor (FREESTYLE LIBRE SENSOR SYSTEM) MISC Use one sensor every 10 days.   Marland Kitchen docusate sodium (COLACE) 100 MG capsule Take 100 mg by mouth daily.   Marland Kitchen escitalopram (LEXAPRO) 10 MG tablet Take 1 tablet (10 mg total) by mouth at bedtime.   . fenofibrate 54 MG tablet TAKE 1 TABLET BY MOUTH ONCE DAILY.   . ferrous sulfate 324 (65 Fe) MG TBEC Take 1 tablet by mouth daily.   . furosemide (LASIX) 40 MG tablet Take 2 tablets (80 mg total) by mouth 2 (two) times daily.   Marland Kitchen HUMALOG KWIKPEN 100  UNIT/ML KiwkPen INJECT 0.1 -0.6 MLS (10 -16 UNITS TOTAL) INTO THE SKIN THREE TIMES DAILY BEFORE MEALS.   Marland Kitchen Insulin Glargine (BASAGLAR KWIKPEN) 100 UNIT/ML SOPN INJECT 0.4 MLS (40 UNITS TOTAL) INTO THE SKIN EVERY MORNING. (Patient taking differently: INJECT 0.5 MLS (50 UNITS TOTAL) INTO THE SKIN EVERY MORNING.) 08/13/2017: Patient states that she takes 50 units at night now   . Insulin Pen Needle 32G X 4 MM MISC Use to inject insulin 5 times daily   . ipratropium (ATROVENT) 0.02 % nebulizer solution USE (1) IN NEBULIZER EVERY 6 HOURS AS NEEDED FOR SHORTNESS OF BREATH.   Marland Kitchen losartan (COZAAR) 25 MG tablet Take 0.5 tablets (12.5 mg total) by mouth daily.   . metolazone (ZAROXOLYN) 2.5 MG tablet Take 1 tablet (2.5 mg total) by mouth daily. Take 30 minutes prior to morning LASIX   . metoprolol succinate (TOPROL-XL) 25 MG 24 hr tablet Take 1 tablet (25 mg total) by mouth 2 (two) times daily.   . mirtazapine (REMERON) 15 MG tablet Take 1 tablet (15 mg total) by mouth at bedtime.   . modafinil (PROVIGIL) 200 MG tablet Take 1 tablet (200 mg total) by mouth daily.   . mometasone (NASONEX) 50 MCG/ACT nasal spray Place 1 spray into the nose daily.   . montelukast (SINGULAIR) 10 MG tablet TAKE ONE TABLET BY MOUTH AT BEDTIME.   Marland Kitchen  mupirocin cream (BACTROBAN) 2 % Apply 1 application topically 2 (two) times daily.   . nitroGLYCERIN (NITROSTAT) 0.4 MG SL tablet Place 1 tablet (0.4 mg total) under the tongue every 5 (five) minutes as needed for chest pain.   Marland Kitchen nystatin (NYSTATIN) powder Apply topically 2 (two) times daily. Apply under breasts   . ondansetron (ZOFRAN-ODT) 4 MG disintegrating tablet Take 1 tablet (4 mg total) by mouth 2 (two) times daily as needed for nausea or vomiting.   Marland Kitchen oxyCODONE-acetaminophen (PERCOCET/ROXICET) 5-325 MG tablet Take 1 tablet by mouth every 8 (eight) hours as needed for severe pain.   . OXYGEN Inhale 3 L into the lungs continuous. 3 liters 24/7   . pantoprazole (PROTONIX) 20 MG tablet  Take 1 tablet (20 mg total) by mouth 2 (two) times daily before a meal.   . PRESCRIPTION MEDICATION Inhale into the lungs See admin instructions. Use BIPAP every time laying down   . primidone (MYSOLINE) 50 MG tablet Take 1 tablet (50 mg total) by mouth at bedtime.   . Probiotic Product (PROBIOTIC PO) Take 1 tablet by mouth at bedtime.   Marland Kitchen QUEtiapine (SEROQUEL) 25 MG tablet Take 1 tablet (25 mg total) by mouth at bedtime.   . triamcinolone lotion (KENALOG) 0.1 % Apply 1 application topically 3 (three) times daily. For up to 14 days and as needed   . vitamin A 10000 UNIT capsule Take 10,000 Units by mouth every morning.   . warfarin (COUMADIN) 4 MG tablet TAKE 1 TABLET BY MOUTH DAILY EXCEPT 1/2 TABLET ON SATURDAYS   . glucose blood (PRODIGY NO CODING BLOOD GLUC) test strip Use as instructed 4 x daily. E11.65    No facility-administered encounter medications on file as of 08/13/2017.     Functional Status:  In your present state of health, do you have any difficulty performing the following activities: 06/11/2017  Hearing? N  Vision? Y  Difficulty concentrating or making decisions? Y  Walking or climbing stairs? Y  Dressing or bathing? N  Doing errands, shopping? Y  Some recent data might be hidden    Fall/Depression Screening: Fall Risk  08/13/2017 08/07/2017 07/29/2017  Falls in the past year? No Yes No  Number falls in past yr: - 1 -  Injury with Fall? - Yes -  Comment - - -  Risk Factor Category  - High Fall Risk -  Comment - - -  Risk for fall due to : - - -  Risk for fall due to: Comment - - -  Follow up - Falls evaluation completed -   PHQ 2/9 Scores 08/07/2017 07/29/2017 07/09/2017 06/25/2017 06/11/2017 05/09/2017 03/24/2017  PHQ - 2 Score 0 0 0 0 1 0 2  PHQ- 9 Score - - - - - - -    Assessment: Patient continues to benefit from health coach outreach for disease management and support.    THN CM Care Plan Problem One     Most Recent Value  THN Long Term Goal   IN 90 days the  patient will lower her a1c by 1 to 2 points  [facility] Long Term Goal Start Date  08/13/17  Interventions for Problem One Long Term Goal  [facility] talked with the patient and her a1c is down to 7.7.  She is continuing to work on her diet.  THN CM Short Term Goal #1   In the next 30 days the patient will verbalize that she is eating nutritional foods  THN CM Short Term  Goal #1 Start Date  08/13/17  [facility] CM Short Term Goal #1 Met Date  08/13/17  Interventions for Short Term Goal #1  The patient haas been seeing anutritionist and eating the correct foods to help bring her a1c down  THN CM Short Term Goal #2   In 30 days the  patient will verbalize that she has written down question to ask her physician  [facility] CM Short Term Goal #2 Start Date  08/13/17  [facility] CM Short Term Goal #2 Met Date  08/13/17  Interventions for Short Term Goal #2  The patient stated that she has discussed her condition with her physician on her visit on 08-11-2017.     Plan: RN Health Coach will contact patient in the month of April and patient agrees to next outreach.  [name] RN, BSN, [place] Direct Dial:  [phone] Fax: [phone]

## 2017-08-14 ENCOUNTER — Ambulatory Visit: Payer: Self-pay | Admitting: Pharmacist

## 2017-08-14 ENCOUNTER — Other Ambulatory Visit: Payer: Self-pay | Admitting: Pharmacist

## 2017-08-14 NOTE — Patient Outreach (Signed)
[facility]) Care Management  08/14/2017  Hanne Kegg [facility] [birth year] [id number]   Patient called me back from my outreach call to her yesterday. She said she received a letter from [employer] and she was not sure what it said.   Plan: Home visit to collect necessary paperwork the 2019 medication assistance year as well as to complete a yearly medication review. Patient uses pill packing from [facility].   Home visit scheduled for 08/21/17   [name], PharmD, [place] Clinical Pharmacist [phone]

## 2017-08-14 NOTE — Telephone Encounter (Signed)
Pt states that she [facility] it off [facility] the [facility] in [place]. As far as the wound clinic and nephrology, nobody has called her yet. Will send this message to [facility] as well to see if she could help. Josely Moffat Kennon Holter, CMA

## 2017-08-15 ENCOUNTER — Encounter: Payer: Self-pay | Admitting: Internal Medicine

## 2017-08-15 NOTE — Telephone Encounter (Signed)
I will fax [facility] on [address] in [place] a request to send stool sample results to [place], as labs are not listed as "in process" in Epic yet.   [name], MD [place], PGY-3

## 2017-08-18 ENCOUNTER — Ambulatory Visit (INDEPENDENT_AMBULATORY_CARE_PROVIDER_SITE_OTHER): Payer: Medicare Other | Admitting: "Endocrinology

## 2017-08-18 DIAGNOSIS — E1165 Type 2 diabetes mellitus with hyperglycemia: Secondary | ICD-10-CM

## 2017-08-18 LAB — GLUCOSE, POCT (MANUAL RESULT ENTRY): POC Glucose: 126 mg/dl — AB (ref 70–99)

## 2017-08-18 NOTE — Progress Notes (Signed)
Pt brings in her Freestyle Libretoday.Shehas the 14day reader and sensors. Verified correct use of the [place] system. Pt is unable to change sensor at home due to vision problems. Libre sensor was placed on the inside of pts upperrightarm. Shevoices understanding on how to use the system and how to get BG reading for insulin injections and as needed. Shewas advised to contact the office if he has any questions or concerns.

## 2017-08-21 ENCOUNTER — Ambulatory Visit: Payer: Self-pay | Admitting: Pharmacist

## 2017-08-22 ENCOUNTER — Encounter: Payer: Self-pay | Admitting: Pharmacist

## 2017-08-22 ENCOUNTER — Other Ambulatory Visit: Payer: Self-pay | Admitting: Pharmacist

## 2017-08-22 NOTE — Patient Outreach (Signed)
[facility]) Care Management  [place]   08/22/2017  Denise Freeman 08/12/1950 [id number]  Subjective: Home visit completed today at the patient's home. [facility] Pharmacist, Charlett Lango and the patient's husband were present for the visit.  HIPAA identifiers were obtained. Patient is a 67 year old female with multiple medical conditions including but not limited to: a fib, type 2 diabetes, CHF, COPD, diarrhea, dyslipidemia, OSA, type 2 diabetes, CKD stage III, vitamin D deficiency.  Patient receives her medications from [facility] in pill packing.  Objective:   HgA1c- 7.7 2/19  Down from 8.3  Encounter Medications: Outpatient Encounter Medications as of 08/22/2017  Medication Sig  . albuterol (PROVENTIL) (2.5 MG/3ML) 0.083% nebulizer solution USE (1) IN NEBULIZER EVERY 6 HOURS AS NEEDED FOR SHORTNESS OF BREATH.  . cetirizine (ZYRTEC) 10 MG tablet Take 1 tablet (10 mg total) by mouth at bedtime.  . cholecalciferol (VITAMIN D) 400 units TABS tablet Take 1 tablet (400 Units total) by mouth 2 (two) times daily.  . Continuous Blood Gluc Sensor (FREESTYLE LIBRE SENSOR SYSTEM) MISC Use one sensor every 10 days.  Marland Kitchen escitalopram (LEXAPRO) 10 MG tablet Take 1 tablet (10 mg total) by mouth at bedtime.  . fenofibrate 54 MG tablet TAKE 1 TABLET BY MOUTH ONCE DAILY.  . ferrous sulfate 324 (65 Fe) MG TBEC Take 1 tablet by mouth daily.  . furosemide (LASIX) 40 MG tablet Take 2 tablets (80 mg total) by mouth 2 (two) times daily.  Marland Kitchen glucose blood (PRODIGY NO CODING BLOOD GLUC) test strip Use as instructed 4 x daily. E11.65  . Insulin Glargine (BASAGLAR KWIKPEN) 100 UNIT/ML SOPN Inject 0.5 mLs (50 Units total) into the skin at bedtime.  . insulin lispro (HUMALOG KWIKPEN) 100 UNIT/ML KiwkPen Inject 0.15-0.21 mLs (15-21 Units total) into the skin 3 (three) times daily.  . Insulin Pen Needle 32G X 4 MM MISC Use to inject insulin 5 times daily  . ipratropium (ATROVENT) 0.02  % nebulizer solution USE (1) IN NEBULIZER EVERY 6 HOURS AS NEEDED FOR SHORTNESS OF BREATH.  Marland Kitchen losartan (COZAAR) 25 MG tablet Take 0.5 tablets (12.5 mg total) by mouth daily.  . metolazone (ZAROXOLYN) 2.5 MG tablet Take 1 tablet (2.5 mg total) by mouth daily. Take 30 minutes prior to morning LASIX  . metoprolol succinate (TOPROL-XL) 25 MG 24 hr tablet Take 1 tablet (25 mg total) by mouth 2 (two) times daily.  . mirtazapine (REMERON) 15 MG tablet Take 1 tablet (15 mg total) by mouth at bedtime.  . modafinil (PROVIGIL) 200 MG tablet Take 1 tablet (200 mg total) by mouth daily.  . mometasone (NASONEX) 50 MCG/ACT nasal spray Place 1 spray into the nose daily.  . montelukast (SINGULAIR) 10 MG tablet TAKE ONE TABLET BY MOUTH AT BEDTIME.  . mupirocin cream (BACTROBAN) 2 % Apply 1 application topically 2 (two) times daily.  Marland Kitchen nystatin (NYSTATIN) powder Apply topically 2 (two) times daily. Apply under breasts  . ondansetron (ZOFRAN-ODT) 4 MG disintegrating tablet Take 1 tablet (4 mg total) by mouth 2 (two) times daily as needed for nausea or vomiting.  Marland Kitchen oxyCODONE-acetaminophen (PERCOCET/ROXICET) 5-325 MG tablet Take 1 tablet by mouth every 8 (eight) hours as needed for severe pain.  . OXYGEN Inhale 3 L into the lungs continuous. 3 liters 24/7  . pantoprazole (PROTONIX) 20 MG tablet TAKE (1) TABLET BY MOUTH TWICE A DAY BEFORE MEALS. (BREAKFAST AND SUPPER)  . PRESCRIPTION MEDICATION Inhale into the lungs See admin instructions.  Use BIPAP every time laying down  . primidone (MYSOLINE) 50 MG tablet Take 1 tablet (50 mg total) by mouth at bedtime.  Marland Kitchen QUEtiapine (SEROQUEL) 25 MG tablet Take 1 tablet (25 mg total) by mouth at bedtime.  . triamcinolone lotion (KENALOG) 0.1 % Apply 1 application topically 3 (three) times daily. For up to 14 days and as needed  . vitamin A 10000 UNIT capsule Take 10,000 Units by mouth every morning.  . warfarin (COUMADIN) 4 MG tablet TAKE 1 TABLET BY MOUTH DAILY EXCEPT 1/2 TABLET ON  SATURDAYS  . docusate sodium (COLACE) 100 MG capsule Take 100 mg by mouth daily.  . nitroGLYCERIN (NITROSTAT) 0.4 MG SL tablet Place 1 tablet (0.4 mg total) under the tongue every 5 (five) minutes as needed for chest pain. (Patient not taking: Reported on 08/22/2017)  . Probiotic Product (PROBIOTIC PO) Take 1 tablet by mouth at bedtime.   No facility-administered encounter medications on file as of 08/22/2017.     Functional Status: In your present state of health, do you have any difficulty performing the following activities: 06/11/2017  Hearing? N  Vision? Y  Difficulty concentrating or making decisions? Y  Walking or climbing stairs? Y  Dressing or bathing? N  Doing errands, shopping? Y  Some recent data might be hidden    Fall/Depression Screening: Fall Risk  08/13/2017 08/07/2017 07/29/2017  Falls in the past year? No Yes No  Number falls in past yr: - 1 -  Injury with Fall? - Yes -  Comment - - -  Risk Factor Category  - High Fall Risk -  Comment - - -  Risk for fall due to : - - -  Risk for fall due to: Comment - - -  Follow up - Falls evaluation completed -   PHQ 2/9 Scores 08/07/2017 07/29/2017 07/09/2017 06/25/2017 06/11/2017 05/09/2017 03/24/2017  PHQ - 2 Score 0 0 0 0 1 0 2  PHQ- 9 Score - - - - - - -      Assessment: [name] medications were reviewed in her home.   Drugs sorted by system:  Neurologic/Psychologic: Escitalopram Mirtazapine Modafinil Primidone Quetiapine   Cardiovascular: Fenofibrate Furosemide Losartan Metolazone Metoprolol Mometasone   Pulmonary/Allergy: Albuterol nebulizer Cetrizine Ipratropium Montelukast Nitroglycerin (did not have) BiPAP  Gastrointestinal: Docusate (does not take) Probiotic  Endocrine: Basaglar Humalog Ondansetron Pantoprazole   Renal:  Topical: Nystatin powder Triamcinolone Mupirocin  Vitamins/Minerals: Ferrous Sulfate Vitamin A  Infectious Diseases: Cholecalciferol  Medication Review  Findings: Patient reported that she does not take docusate  Ondansetron-patient needed a refill on this and requested that  It be placed the pill pack with her other medications but she does not take it daily and it is an ODT tablet. (Not sure about the bioavailability of the product it is stored outside of the foil packing from the manufacturer).  Patient has been using her pill packs correctly and knew what all of her medications were for.  Medication Assistance Findings: -patient is over income for the Extra Help Program  -Patient has [place] this year. As such, her copays for Basaglar and Humalog are only $25 each this year -Her TROOP year to date is $93.19 -patient understands she would need to spend at least $1100 out of pocket to qualify to receive Basaglar and Humalog from [employer] patient assistance program. -patient also understands the necessary paperwork she will need to provide to complete the applications. -patient will gather all necessary paperwork and contact [facility] pharmacist when her  spending gets close to $1100 (She understands to keep the EOBs from [place].)  Patient had a letter in her possession from [employer] requesting information about a side effect she experienced from a drug.  It is unclear what medication this is as the patient is on mostly all generics with the exception of her Insulin. Patient was advised to call the number on the letter for clarification.   Plan: Close patient's pharmacy case as her pharmacy needs have been met. Route note to her PCP  [place], [name] of patient's pharmacy case closure status as she is still active with the patient.   [name], PharmD, [place] Clinical Pharmacist [phone]

## 2017-08-27 ENCOUNTER — Telehealth: Payer: Self-pay | Admitting: Nutrition

## 2017-08-27 ENCOUNTER — Ambulatory Visit (INDEPENDENT_AMBULATORY_CARE_PROVIDER_SITE_OTHER): Payer: Medicare Other | Admitting: *Deleted

## 2017-08-27 ENCOUNTER — Ambulatory Visit: Payer: Self-pay | Admitting: Pulmonary Disease

## 2017-08-27 DIAGNOSIS — Z5181 Encounter for therapeutic drug level monitoring: Secondary | ICD-10-CM

## 2017-08-27 DIAGNOSIS — I480 Paroxysmal atrial fibrillation: Secondary | ICD-10-CM | POA: Diagnosis not present

## 2017-08-27 LAB — POCT INR: INR: 1.4

## 2017-08-27 MED ORDER — WARFARIN SODIUM 4 MG PO TABS
ORAL_TABLET | ORAL | 4 refills | Status: DC
Start: 2017-08-27 — End: 2017-10-01

## 2017-08-27 NOTE — Patient Instructions (Signed)
Take coumadin 2 tablets tonight then increase dose to 1 tablet daily Recheck in 2 weeks

## 2017-08-27 NOTE — Telephone Encounter (Signed)
Message left on machine that her insulin has come in, and to come and pick it up

## 2017-09-01 ENCOUNTER — Telehealth: Payer: Self-pay | Admitting: Internal Medicine

## 2017-09-01 ENCOUNTER — Encounter: Payer: Self-pay | Admitting: Internal Medicine

## 2017-09-01 NOTE — Telephone Encounter (Signed)
Called to let patient know her stool studies were negative for infection. She expressed understanding and says she continues to take imodium prn when leaving the house.   [name], MD [place], PGY-3

## 2017-09-02 ENCOUNTER — Other Ambulatory Visit: Payer: Self-pay | Admitting: Internal Medicine

## 2017-09-02 ENCOUNTER — Ambulatory Visit
Admission: RE | Admit: 2017-09-02 | Discharge: 2017-09-02 | Disposition: A | Discharge status: Home / Self Care | Payer: Medicare Other | Source: Ambulatory Visit | Attending: Family Medicine | Admitting: Family Medicine

## 2017-09-02 DIAGNOSIS — M79671 Pain in right foot: Secondary | ICD-10-CM

## 2017-09-02 DIAGNOSIS — M19071 Primary osteoarthritis, right ankle and foot: Secondary | ICD-10-CM | POA: Diagnosis not present

## 2017-09-03 ENCOUNTER — Ambulatory Visit (INDEPENDENT_AMBULATORY_CARE_PROVIDER_SITE_OTHER): Payer: Medicare Other

## 2017-09-03 ENCOUNTER — Encounter: Payer: Self-pay | Admitting: Internal Medicine

## 2017-09-03 ENCOUNTER — Telehealth: Payer: Self-pay | Admitting: Internal Medicine

## 2017-09-03 VITALS — Ht 59.0 in | Wt 183.0 lb

## 2017-09-03 DIAGNOSIS — E1165 Type 2 diabetes mellitus with hyperglycemia: Secondary | ICD-10-CM | POA: Diagnosis not present

## 2017-09-03 NOTE — Telephone Encounter (Signed)
Left message for patient that xray of her right foot did not show fracture. Xray was ordered for patient who was briefly evaluated during husband's office visit at [facility] yesterday, 09/02/17. Patient most likely has sprain of anterior talofibular ligament given pain just below lateral malleolus but was saying it was difficult to bear weight, so xray obtained. Had recommended elevating, icing, and resting.   Patient had questions about chronic diarrhea. Reviewed dosing of imodium. Would consider labs for celiac disease at next visit. Stool studies were negative recently.  [name], MD [place], PGY-3

## 2017-09-05 NOTE — Progress Notes (Signed)
Pt brings in herFreestyle Libretoday.Shehas the 14day reader and sensors. Went over instructions on how to use the [place] system. Pt unable to apply at home due to vision problems.  Libre sensor was placed on the inside of pts upper right arm. Shevoices understanding on how to use the system and how to get BG reading for insulin injections and as needed. Shewas advised to contact the office if he has any questions or concerns.

## 2017-09-05 NOTE — Patient Instructions (Addendum)
Pt instructed to call with any questions or concerns.

## 2017-09-08 ENCOUNTER — Ambulatory Visit: Payer: Self-pay | Admitting: Internal Medicine

## 2017-09-10 ENCOUNTER — Ambulatory Visit (INDEPENDENT_AMBULATORY_CARE_PROVIDER_SITE_OTHER): Payer: Medicare Other | Admitting: *Deleted

## 2017-09-10 DIAGNOSIS — Z5181 Encounter for therapeutic drug level monitoring: Secondary | ICD-10-CM | POA: Diagnosis not present

## 2017-09-10 DIAGNOSIS — I480 Paroxysmal atrial fibrillation: Secondary | ICD-10-CM | POA: Diagnosis not present

## 2017-09-10 LAB — POCT INR: INR: 1.8

## 2017-09-10 NOTE — Patient Instructions (Signed)
Take coumadin 1 1/2 tablets tonight then increase dose to 1 tablet daily except 1 1/2 tablets on Saturdays Recheck in 3 weeks

## 2017-09-12 DIAGNOSIS — G894 Chronic pain syndrome: Secondary | ICD-10-CM | POA: Diagnosis not present

## 2017-09-12 DIAGNOSIS — M5032 Other cervical disc degeneration, mid-cervical region, unspecified level: Secondary | ICD-10-CM | POA: Diagnosis not present

## 2017-09-12 DIAGNOSIS — M5136 Other intervertebral disc degeneration, lumbar region: Secondary | ICD-10-CM | POA: Diagnosis not present

## 2017-09-15 ENCOUNTER — Ambulatory Visit (INDEPENDENT_AMBULATORY_CARE_PROVIDER_SITE_OTHER): Payer: Medicare Other

## 2017-09-15 DIAGNOSIS — E1165 Type 2 diabetes mellitus with hyperglycemia: Secondary | ICD-10-CM | POA: Diagnosis not present

## 2017-09-15 NOTE — Progress Notes (Signed)
Pt brings in herFreestyle Libretoday.Shehas the 14day reader and sensors. Went over instructions on how to use the Libre system. Also went over instruction on how to properly apply the sensor to herarm. Libre sensor was placed on the inside of pts upperrightarm. Libre removed from L arm.  Shevoices understanding on how to use the system and how to get BG reading for insulin injections and as needed. Shewas advised to contact the office if he has any questions or concerns.  

## 2017-09-15 NOTE — Patient Instructions (Signed)
Pt notified to contact office with any questions/concerns

## 2017-09-16 ENCOUNTER — Other Ambulatory Visit: Payer: Self-pay

## 2017-09-16 ENCOUNTER — Ambulatory Visit: Payer: Medicare Other

## 2017-09-16 NOTE — Patient Outreach (Addendum)
[facility]) Care Management  09/16/2017  Denise Freeman [facility] Oct 28, 1950 [id number]   1st outreach attempt to the patient for monthly assessment. No answer.  HIPAA compliant voicemail left with contact information.  Plan: RN Health Coach will send letter. RN Health Coach will make outreach attempt in three to four business days.    [name] RN, BSN, [place] Direct Dial:  [phone]  Fax: [phone]

## 2017-09-18 ENCOUNTER — Other Ambulatory Visit: Payer: Self-pay

## 2017-09-18 NOTE — Patient Outreach (Signed)
[facility]) Care Management  [place]  09/18/2017   Denise Freeman 26-Sep-1950 [id number]  Subjective: Telephone call from the patient for initial assessment. HIPAA verified.  The patient states that she is having pain.  She rates the pain at a 10/10. She states that her pain medication in not helping.  She states that she leg was hit by a car door and it is causing her pain she she walks.  She denies any swelling, redness, heat or bruising and no bleeding. She states that she is unable to get transportation. She states that she has tried to contact scat and other options with no response.  She states that her blood sugar was a little elevated at 161.  She states that she has been adherent with her medications.  RN Health Coach advised the patient to contact her physician for an appointment.  RN Health Coach also called social worker Daneen Schick for other options of transportation. RN Health Coach tried to call the patient back with information and no answer.  Left a HIPAA compliant voicemail with contact information.  Objective:   Encounter Medications:  Outpatient Encounter Medications as of 09/18/2017  Medication Sig  . warfarin (COUMADIN) 4 MG tablet TAKE 2 TABLETS TONIGHT THEN INCREASE DOSE TO 1 TABLET BY MOUTH DAILY  . albuterol (PROVENTIL) (2.5 MG/3ML) 0.083% nebulizer solution USE (1) IN NEBULIZER EVERY 6 HOURS AS NEEDED FOR SHORTNESS OF BREATH.  . cetirizine (ZYRTEC) 10 MG tablet Take 1 tablet (10 mg total) by mouth at bedtime.  . cholecalciferol (VITAMIN D) 400 units TABS tablet Take 1 tablet (400 Units total) by mouth 2 (two) times daily.  . Continuous Blood Gluc Sensor (FREESTYLE LIBRE SENSOR SYSTEM) MISC Use one sensor every 10 days.  Marland Kitchen docusate sodium (COLACE) 100 MG capsule Take 100 mg by mouth daily.  Marland Kitchen escitalopram (LEXAPRO) 10 MG tablet Take 1 tablet (10 mg total) by mouth at bedtime.  . fenofibrate 54 MG tablet TAKE 1 TABLET BY MOUTH ONCE DAILY.   . ferrous sulfate 324 (65 Fe) MG TBEC Take 1 tablet by mouth daily.  . furosemide (LASIX) 40 MG tablet Take 2 tablets (80 mg total) by mouth 2 (two) times daily.  Marland Kitchen glucose blood (PRODIGY NO CODING BLOOD GLUC) test strip Use as instructed 4 x daily. E11.65  . Insulin Glargine (BASAGLAR KWIKPEN) 100 UNIT/ML SOPN Inject 0.5 mLs (50 Units total) into the skin at bedtime.  . insulin lispro (HUMALOG KWIKPEN) 100 UNIT/ML KiwkPen Inject 0.15-0.21 mLs (15-21 Units total) into the skin 3 (three) times daily.  . Insulin Pen Needle 32G X 4 MM MISC Use to inject insulin 5 times daily  . ipratropium (ATROVENT) 0.02 % nebulizer solution USE (1) IN NEBULIZER EVERY 6 HOURS AS NEEDED FOR SHORTNESS OF BREATH.  Marland Kitchen losartan (COZAAR) 25 MG tablet Take 0.5 tablets (12.5 mg total) by mouth daily.  . metolazone (ZAROXOLYN) 2.5 MG tablet Take 1 tablet (2.5 mg total) by mouth daily. Take 30 minutes prior to morning LASIX  . metoprolol succinate (TOPROL-XL) 25 MG 24 hr tablet Take 1 tablet (25 mg total) by mouth 2 (two) times daily.  . mirtazapine (REMERON) 15 MG tablet Take 1 tablet (15 mg total) by mouth at bedtime.  . modafinil (PROVIGIL) 200 MG tablet Take 1 tablet (200 mg total) by mouth daily.  . mometasone (NASONEX) 50 MCG/ACT nasal spray Place 1 spray into the nose daily.  . montelukast (SINGULAIR) 10 MG tablet TAKE ONE TABLET BY  MOUTH AT BEDTIME.  . mupirocin cream (BACTROBAN) 2 % Apply 1 application topically 2 (two) times daily.  . nitroGLYCERIN (NITROSTAT) 0.4 MG SL tablet Place 1 tablet (0.4 mg total) under the tongue every 5 (five) minutes as needed for chest pain. (Patient not taking: Reported on 08/22/2017)  . nystatin (NYSTATIN) powder Apply topically 2 (two) times daily. Apply under breasts  . ondansetron (ZOFRAN-ODT) 4 MG disintegrating tablet Take 1 tablet (4 mg total) by mouth 2 (two) times daily as needed for nausea or vomiting.  Marland Kitchen oxyCODONE-acetaminophen (PERCOCET/ROXICET) 5-325 MG tablet Take 1 tablet  by mouth every 8 (eight) hours as needed for severe pain.  . OXYGEN Inhale 3 L into the lungs continuous. 3 liters 24/7  . pantoprazole (PROTONIX) 20 MG tablet TAKE (1) TABLET BY MOUTH TWICE A DAY BEFORE MEALS. (BREAKFAST AND SUPPER)  . PRESCRIPTION MEDICATION Inhale into the lungs See admin instructions. Use BIPAP every time laying down  . primidone (MYSOLINE) 50 MG tablet Take 1 tablet (50 mg total) by mouth at bedtime.  . Probiotic Product (PROBIOTIC PO) Take 1 tablet by mouth at bedtime.  Marland Kitchen QUEtiapine (SEROQUEL) 25 MG tablet Take 1 tablet (25 mg total) by mouth at bedtime.  . triamcinolone lotion (KENALOG) 0.1 % Apply 1 application topically 3 (three) times daily. For up to 14 days and as needed  . vitamin A 10000 UNIT capsule Take 10,000 Units by mouth every morning.   No facility-administered encounter medications on file as of 09/18/2017.     Functional Status:  In your present state of health, do you have any difficulty performing the following activities: 06/11/2017  Hearing? N  Vision? Y  Difficulty concentrating or making decisions? Y  Walking or climbing stairs? Y  Dressing or bathing? N  Doing errands, shopping? Y  Some recent data might be hidden    Fall/Depression Screening: Fall Risk  09/18/2017 09/15/2017 09/05/2017  Falls in the past year? Yes Yes No  Number falls in past yr: '1 1 1  ' Injury with Fall? Yes Yes Yes  Comment - - -  Risk Factor Category  High Fall Risk High Fall Risk High Fall Risk  Comment - - -  Risk for fall due to : Impaired balance/gait - -  Risk for fall due to: Comment - - -  Follow up Falls evaluation completed Falls evaluation completed Falls evaluation completed   PHQ 2/9 Scores 09/15/2017 09/05/2017 08/07/2017 07/29/2017 07/09/2017 06/25/2017 06/11/2017  PHQ - 2 Score 0 0 0 0 0 0 1  PHQ- 9 Score - - - - - - -    Assessment: Patient continues to benefit from health coach outreach for disease management and support.   THN CM Care Plan Problem One      Most Recent Value  Care Plan Problem One  Knowledge deficit of management of diabetes  Role Documenting the Problem One  Health Coach  THN Long Term Goal   IN 90 days the patient will lower her a1c by 1 to 2 points  [facility] Long Term Goal Start Date  09/18/17  Interventions for Problem One Long Term Goal  Reviewed cbg record,  discussed dietary intake  THN CM Short Term Goal #1   In the next 30 days the patient will verbalize that she is eating nutritional foods  THN CM Short Term Goal #1 Start Date  09/18/17  [facility] CM Short Term Goal #1 Met Date  09/18/17  Interventions for Short Term Goal #1  Reviewed dietary  intake with the patient   THN CM Short Term Goal #2   In 30 days the pateint will verbalize that she has seen her physician for her hip and leg.  THN CM Short Term Goal #2 Start Date  09/18/17  Interventions for Short Term Goal #2  discussed symptoms with the patient     Plan: RN Health Coach will wait 24 hours for return call from the patient regarding her leg. RN Health Coach will contact patient in the month of May and patient agrees to next outreach.  [name] RN, BSN, [place] Direct Dial:  [phone]  Fax: [phone]

## 2017-09-19 ENCOUNTER — Emergency Department (HOSPITAL_COMMUNITY)
Admission: EM | Admit: 2017-09-19 | Discharge: 2017-09-19 | Disposition: A | Discharge status: Home / Self Care | Payer: Medicare Other | Attending: Emergency Medicine | Admitting: Emergency Medicine

## 2017-09-19 ENCOUNTER — Other Ambulatory Visit: Payer: Self-pay

## 2017-09-19 ENCOUNTER — Encounter (HOSPITAL_COMMUNITY): Payer: Self-pay

## 2017-09-19 ENCOUNTER — Emergency Department (HOSPITAL_COMMUNITY): Payer: Medicare Other

## 2017-09-19 DIAGNOSIS — S8991XA Unspecified injury of right lower leg, initial encounter: Secondary | ICD-10-CM | POA: Diagnosis not present

## 2017-09-19 DIAGNOSIS — M79604 Pain in right leg: Secondary | ICD-10-CM | POA: Diagnosis not present

## 2017-09-19 DIAGNOSIS — J45909 Unspecified asthma, uncomplicated: Secondary | ICD-10-CM | POA: Diagnosis not present

## 2017-09-19 DIAGNOSIS — I5032 Chronic diastolic (congestive) heart failure: Secondary | ICD-10-CM | POA: Diagnosis not present

## 2017-09-19 DIAGNOSIS — Z9049 Acquired absence of other specified parts of digestive tract: Secondary | ICD-10-CM | POA: Insufficient documentation

## 2017-09-19 DIAGNOSIS — I130 Hypertensive heart and chronic kidney disease with heart failure and stage 1 through stage 4 chronic kidney disease, or unspecified chronic kidney disease: Secondary | ICD-10-CM | POA: Insufficient documentation

## 2017-09-19 DIAGNOSIS — Z79899 Other long term (current) drug therapy: Secondary | ICD-10-CM | POA: Insufficient documentation

## 2017-09-19 DIAGNOSIS — F319 Bipolar disorder, unspecified: Secondary | ICD-10-CM | POA: Insufficient documentation

## 2017-09-19 DIAGNOSIS — S3993XA Unspecified injury of pelvis, initial encounter: Secondary | ICD-10-CM | POA: Diagnosis not present

## 2017-09-19 DIAGNOSIS — N183 Chronic kidney disease, stage 3 (moderate): Secondary | ICD-10-CM | POA: Insufficient documentation

## 2017-09-19 DIAGNOSIS — Z7901 Long term (current) use of anticoagulants: Secondary | ICD-10-CM | POA: Diagnosis not present

## 2017-09-19 DIAGNOSIS — E1122 Type 2 diabetes mellitus with diabetic chronic kidney disease: Secondary | ICD-10-CM | POA: Insufficient documentation

## 2017-09-19 DIAGNOSIS — M79661 Pain in right lower leg: Secondary | ICD-10-CM | POA: Diagnosis not present

## 2017-09-19 DIAGNOSIS — Z794 Long term (current) use of insulin: Secondary | ICD-10-CM | POA: Insufficient documentation

## 2017-09-19 DIAGNOSIS — S79921A Unspecified injury of right thigh, initial encounter: Secondary | ICD-10-CM | POA: Diagnosis not present

## 2017-09-19 DIAGNOSIS — F419 Anxiety disorder, unspecified: Secondary | ICD-10-CM | POA: Diagnosis not present

## 2017-09-19 MED ORDER — OXYCODONE-ACETAMINOPHEN 5-325 MG PO TABS
2.0000 | ORAL_TABLET | Freq: Once | ORAL | Status: AC
  Administered 2017-09-19: 2 via ORAL
  Filled 2017-09-19: qty 2

## 2017-09-19 MED ORDER — HYDROMORPHONE HCL 1 MG/ML IJ SOLN
1.0000 mg | Freq: Once | INTRAMUSCULAR | Status: AC
  Administered 2017-09-19: 1 mg via INTRAMUSCULAR
  Filled 2017-09-19: qty 1

## 2017-09-19 MED ORDER — OXYCODONE-ACETAMINOPHEN 5-325 MG PO TABS: 1.0000 | ORAL_TABLET | Freq: Four times a day (QID) | ORAL | Status: DC | PRN

## 2017-09-19 MED ORDER — OXYCODONE-ACETAMINOPHEN 5-325 MG PO TABS: 2.0000 | ORAL_TABLET | Freq: Four times a day (QID) | ORAL | 0 refills | Status: DC | PRN

## 2017-09-19 NOTE — ED Notes (Signed)
Pt report no much pain relief  States her her only pain meds are  'hydro codeine"

## 2017-09-19 NOTE — ED Provider Notes (Signed)
[facility] EMERGENCY DEPARTMENT Provider Note   CSN: [id number] Arrival date & time: 09/19/17  1306     History   Chief Complaint Chief Complaint  Patient presents with  . Leg Pain    HPI Denise Freeman is a 67 y.o. female.   Leg Pain   This is a new problem. The current episode started more than 2 days ago. The problem occurs constantly. The problem has not changed since onset.The pain is present in the right lower leg and right upper leg. The quality of the pain is described as sharp and dull. The pain is moderate. Pertinent negatives include no numbness, full range of motion and no stiffness. She has tried nothing for the symptoms. The treatment provided no relief. There has been no history of extremity trauma.    Past Medical History:  Diagnosis Date  . Allergy   . Anemia   . Anxiety   . Asthma   . Atrial fibrillation ([place])   . Bipolar 1 disorder ([place])   . 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 Cholelithiasis    a. 09/2016 s/p Lap Chole.  . Chronic bronchitis ([place])   . Chronic combined systolic and diastolic congestive heart failure ([place])    a. 09/2016 Echo: EF 30-35%.  . Colon polyps   . COPD (chronic obstructive pulmonary disease) ([place])   . Depression   . Family history of adverse reaction to anesthesia    Uncle was positive for malignant hyperthermia; patient had testing done and was negative.  . Fibromyalgia   . GERD (gastroesophageal reflux disease)   . Gout   . High cholesterol   . History of blood transfusion 06/2015   "bleeding from my rectum"  . History of hiatal hernia   . HOH (hard of hearing)   . Hx of colonic polyps 03/21/2016   3 small adenomas no recall - co-morbidities  . Neuropathy    Disc Back   . NICM (nonischemic cardiomyopathy) ([place])    a. Previously worked up in [place], [place]-->low EF with subsequent recovery.  Multiple caths (last ~ 2014 per pt report)--reportedly nl cors;  b. 09/2016 Echo: EF 30-35%, antsept/apical HK, mild  MR, mildly dil LA, mod dil RA;  c. 09/2016 Lexi MV: EF 26%, glob HK, sept DK, med size, mod intensity fixed septal defect - BBB/PVC related artifact, no ischemia.  . On home oxygen therapy    "3L; 24/7" (03/14/2016)  . OSA treated with BiPAP    uses biPAP, 10 (03/14/2016)  . Osteoarthritis   . Oxygen deficiency   . Pneumonia   . Type II diabetes mellitus [facility])     Patient Active Problem List   Diagnosis Date Noted  . Diarrhea 07/29/2017  . Type 2 diabetes mellitus with stage 3 chronic kidney disease, with long-term current use of insulin ([place]) 04/01/2017  . Essential hypertension, benign 04/01/2017  . Mood disorder in conditions classified elsewhere 03/31/2017  . Chronic diastolic congestive heart failure ([place]) 03/12/2017  . Rash of face 11/26/2016  . Medication refill 11/05/2016  . Chronic systolic congestive heart failure ([place]) 11/05/2016  . Pressure injury of skin 09/09/2016  . NICM (nonischemic cardiomyopathy) ([place])   . Abdominal pain   . COPD exacerbation ([place])   . Class 2 severe obesity due to excess calories with serious comorbidity and body mass index (BMI) of 38.0 to 38.9 in adult [facility])   . Acute systolic congestive heart failure ([place])   . S/P laparoscopic  cholecystectomy   . Heart failure ([place]) 09/06/2016  . Dyslipidemia 08/07/2016  . Vision loss 04/19/2016  . Hx of colonic polyps 03/21/2016  . Blood in stool   . Benign neoplasm of cecum   . Benign neoplasm of rectum   . CKD (chronic kidney disease), stage III ([place]) 03/14/2016  . Melena 03/14/2016  . Mixed hyperlipidemia 03/14/2016  . Scalp lesion 03/14/2016  . Peripheral polyneuropathy 01/26/2016  . Vitamin D deficiency 09/26/2015  . DM type 2 causing vascular disease ([place]) 08/28/2015  . Chronic upper GI bleeding 06/24/2015  . COPD with asthma ([place]) 02/22/2015  . OSA (obstructive sleep apnea) 08/22/2014  . Other emphysema ([place]) 05/29/2014  . Chronic hypoxic, hypercapnic respiratory failure 04/21/2014  .  Microcytic anemia 04/21/2014  . Fibromyalgia 04/14/2014  . Uncontrolled secondary diabetes mellitus with stage 3 CKD (GFR 30-59) (H[facility]) 04/14/2014  . Dyspnea on exertion 04/14/2014  . Acute on chronic combined systolic and diastolic CHF (congestive heart failure) ([place]) 04/14/2014  . Atrial fibrillation [I48.91] 04/12/2014    Past Surgical History:  Procedure Laterality Date  . APPENDECTOMY     "they busted"  . bladder stimulator     pt states, "it cannot be turned off; it's in my right hip; dead battery so it's not working anymore". (03/14/2016)  . BLADDER SUSPENSION     2003, 2006 and 2010  . CATARACT EXTRACTION W/PHACO Right 11/29/2014   Procedure: CATARACT EXTRACTION PHACO AND INTRAOCULAR LENS PLACEMENT (IOC);  Surgeon: Rutherford Guys, MD;  Location: [facility];  Service: Ophthalmology;  Laterality: Right;  CDE:3.81  . CATARACT EXTRACTION W/PHACO Left 12/13/2014   Procedure: CATARACT EXTRACTION PHACO AND INTRAOCULAR LENS PLACEMENT (IOC);  Surgeon: Rutherford Guys, MD;  Location: [facility];  Service: Ophthalmology;  Laterality: Left;  CDE:6.59  . CERVICAL DISC SURGERY N/A 2009   4, 6, and 7 cervical disc replaced  . CHOLECYSTECTOMY N/A 09/13/2016   Procedure: LAPAROSCOPIC CHOLECYSTECTOMY;  Surgeon: Rolm Bookbinder, MD;  Location: [place];  Service: General;  Laterality: N/A;  . COLONOSCOPY WITH PROPOFOL N/A 03/15/2016   Procedure: COLONOSCOPY WITH PROPOFOL;  Surgeon: Gatha Mayer, MD;  Location: [place];  Service: Endoscopy;  Laterality: N/A;  . ESOPHAGOGASTRODUODENOSCOPY (EGD) WITH PROPOFOL N/A 03/15/2016   Procedure: ESOPHAGOGASTRODUODENOSCOPY (EGD) WITH PROPOFOL;  Surgeon: Gatha Mayer, MD;  Location: [place];  Service: Endoscopy;  Laterality: N/A;  . HEEL SPUR SURGERY Bilateral   . HERNIA REPAIR    . I&D EXTREMITY Right 06/13/2015   Procedure: MINOR IRRIGATION AND DEBRIDEMENT EXTREMITY REMOVAL OF NAIL;  Surgeon: Daryll Brod, MD;  Location: [place];  Service:  Orthopedics;  Laterality: Right;  . TUBAL LIGATION    . UMBILICAL HERNIA REPAIR     w/mesh     OB History   None      Home Medications    Prior to Admission medications   Medication Sig Start Date End Date Taking? Authorizing Provider  albuterol (PROVENTIL) (2.5 MG/3ML) 0.083% nebulizer solution USE (1) IN NEBULIZER EVERY 6 HOURS AS NEEDED FOR SHORTNESS OF BREATH. 07/17/17  Yes Chesley Mires, MD  cetirizine (ZYRTEC) 10 MG tablet Take 1 tablet (10 mg total) by mouth at bedtime. 01/30/17  Yes Rogue Bussing, MD  cholecalciferol (VITAMIN D) 400 units TABS tablet Take 1 tablet (400 Units total) by mouth 2 (two) times daily. 07/17/17  Yes Rogue Bussing, MD  docusate sodium (COLACE) 100 MG capsule Take 100 mg by mouth daily.   Yes [provider]  escitalopram (LEXAPRO) 10 MG tablet Take 1 tablet (10 mg total) by mouth at bedtime. 07/31/17  Yes Hisada, Elie Goody, MD  fenofibrate 54 MG tablet TAKE 1 TABLET BY MOUTH ONCE DAILY. 07/17/17  Yes Nida, Marella Chimes, MD  ferrous sulfate 324 (65 Fe) MG TBEC Take 1 tablet by mouth daily.   Yes [provider]  furosemide (LASIX) 40 MG tablet Take 2 tablets (80 mg total) by mouth 2 (two) times daily. 10/08/16  Yes Hilty, Nadean Corwin, MD  Insulin Glargine (BASAGLAR KWIKPEN) 100 UNIT/ML SOPN Inject 0.5 mLs (50 Units total) into the skin at bedtime. 08/14/17  Yes Nida, Marella Chimes, MD  insulin lispro (HUMALOG KWIKPEN) 100 UNIT/ML KiwkPen Inject 0.15-0.21 mLs (15-21 Units total) into the skin 3 (three) times daily. 08/14/17  Yes Nida, Marella Chimes, MD  ipratropium (ATROVENT) 0.02 % nebulizer solution USE (1) IN NEBULIZER EVERY 6 HOURS AS NEEDED FOR SHORTNESS OF BREATH. 07/17/17  Yes Chesley Mires, MD  losartan (COZAAR) 25 MG tablet Take 0.5 tablets (12.5 mg total) by mouth daily. 06/13/17 09/20/18 Yes Hilty, Nadean Corwin, MD  metolazone (ZAROXOLYN) 2.5 MG tablet Take 1 tablet (2.5 mg total) by mouth daily. Take 30 minutes prior to  morning LASIX 06/13/17  Yes Hilty, Nadean Corwin, MD  metoprolol succinate (TOPROL-XL) 25 MG 24 hr tablet Take 1 tablet (25 mg total) by mouth 2 (two) times daily. 10/21/16  Yes Hilty, Nadean Corwin, MD  mirtazapine (REMERON) 15 MG tablet Take 1 tablet (15 mg total) by mouth at bedtime. 07/31/17  Yes Hisada, Elie Goody, MD  modafinil (PROVIGIL) 200 MG tablet Take 1 tablet (200 mg total) by mouth daily. 10/10/16  Yes Sood, Elisabeth Cara, MD  mometasone (NASONEX) 50 MCG/ACT nasal spray Place 1 spray into the nose daily. 01/10/17  Yes Sood, Elisabeth Cara, MD  montelukast (SINGULAIR) 10 MG tablet TAKE ONE TABLET BY MOUTH AT BEDTIME. 07/17/17  Yes Chesley Mires, MD  mupirocin cream (BACTROBAN) 2 % Apply 1 application topically 2 (two) times daily. 06/10/17  Yes Rogue Bussing, MD  nitroGLYCERIN (NITROSTAT) 0.4 MG SL tablet Place 1 tablet (0.4 mg total) under the tongue every 5 (five) minutes as needed for chest pain. 10/31/15  Yes Hilty, Nadean Corwin, MD  nystatin (NYSTATIN) powder Apply topically 2 (two) times daily. Apply under breasts 10/09/16  Yes Rogue Bussing, MD  OXYGEN Inhale 3 L into the lungs continuous. 3 liters 24/7   Yes [provider]  pantoprazole (PROTONIX) 20 MG tablet TAKE (1) TABLET BY MOUTH TWICE A DAY BEFORE MEALS. (BREAKFAST AND SUPPER) 08/13/17  Yes Rogue Bussing, MD  PRESCRIPTION MEDICATION Inhale into the lungs See admin instructions. Use BIPAP every time laying down   Yes [provider]  primidone (MYSOLINE) 50 MG tablet Take 1 tablet (50 mg total) by mouth at bedtime. 11/05/16  Yes Rogue Bussing, MD  Probiotic Product (PROBIOTIC PO) Take 1 tablet by mouth at bedtime.   Yes [provider]  QUEtiapine (SEROQUEL) 25 MG tablet Take 1 tablet (25 mg total) by mouth at bedtime. 07/31/17  Yes Hisada, Elie Goody, MD  triamcinolone lotion (KENALOG) 0.1 % Apply 1 application topically 3 (three) times daily. For up to 14 days and as needed 01/26/16  Yes [place], Betty G,  MD  vitamin A 10000 UNIT capsule Take 10,000 Units by mouth every morning.   Yes [provider]  warfarin (COUMADIN) 4 MG tablet TAKE 2 TABLETS TONIGHT THEN INCREASE DOSE TO 1 TABLET BY MOUTH DAILY 08/27/17  Yes  Pixie Casino, MD  Continuous Blood Gluc Sensor (FREESTYLE LIBRE SENSOR SYSTEM) MISC Use one sensor every 10 days. 04/30/17   Cassandria Anger, MD  glucose blood (PRODIGY NO CODING BLOOD GLUC) test strip Use as instructed 4 x daily. E11.65 04/30/17   Cassandria Anger, MD  Insulin Pen Needle 32G X 4 MM MISC Use to inject insulin 5 times daily 01/27/17   Philemon Kingdom, MD  oxyCODONE-acetaminophen (PERCOCET/ROXICET) 5-325 MG tablet Take 2 tablets by mouth every 6 (six) hours as needed for severe pain. 09/19/17   Antinette Keough, Corene Cornea, MD    Family History Family History  Problem Relation Age of Onset  . Heart disease Mother   . COPD Mother   . Diabetes Mother   . Breast cancer Mother   . Heart disease Father   . Hyperlipidemia Father   . COPD Sister   . Heart disease Sister   . Diabetes Sister   . Heart disease Maternal Grandmother   . Cancer Maternal Grandmother        stomach  . Cancer Maternal Grandfather        lung   . Bipolar disorder Brother     Social History Social History   Tobacco Use  . Smoking status: Former Smoker    Packs/day: 2.00    Years: 14.00    Pack years: 28.00    Types: Cigarettes    Last attempt to quit: 04/12/1989    Years since quitting: 28.4  . Smokeless tobacco: Never Used  Substance Use Topics  . Alcohol use: No    Alcohol/week: 0.0 oz  . Drug use: No     Allergies   Xarelto [rivaroxaban]; Ancef [cefazolin]; Levaquin [levofloxacin in d5w]; Lyrica [pregabalin]; Tamiflu [oseltamivir phosphate]; Zoloft [sertraline hcl]; Augmentin [amoxicillin-pot clavulanate]; Ciprofloxacin; Haldol [haloperidol]; Nsaids; Penicillins; and Topamax [topiramate]   Review of Systems Review of Systems  Musculoskeletal: Negative for  stiffness.       Right lower leg pain  Neurological: Negative for numbness.  All other systems reviewed and are negative.    Physical Exam Updated Vital Signs BP 130/69 (BP Location: Left Arm)   Pulse 60   Temp 98.3 F (36.8 C) (Oral)   Resp 20   SpO2 100%   Physical Exam  Constitutional: She is oriented to person, place, and time. She appears well-developed and well-nourished.  HENT:  Head: Normocephalic and atraumatic.  Eyes: Conjunctivae and EOM are normal.  Neck: Normal range of motion.  Cardiovascular: Normal rate and regular rhythm.  Pulmonary/Chest: No stridor. No respiratory distress.  Abdominal: Soft. Bowel sounds are normal. She exhibits no distension.  Musculoskeletal: Normal range of motion. She exhibits tenderness (to right lower leg and upper leg). She exhibits no edema or deformity.  Neurological: She is alert and oriented to person, place, and time. No cranial nerve deficit. Coordination normal.  Skin: Skin is warm and dry.  Nursing note and vitals reviewed.    ED Treatments / Results  Labs (all labs ordered are listed, but only abnormal results are displayed) Labs Reviewed - No data to display  EKG None  Radiology Dg Pelvis 1-2 Views  Result Date: 09/19/2017 CLINICAL DATA:  Right leg slammed in car door 4 days ago. EXAM: PELVIS - 1-2 VIEW COMPARISON:  None. FINDINGS: Mild narrowing of the right hip joint space. There is bilateral acetabular osteophytosis with mild femoral neck buttressing. No fracture or dislocation of either hip. There is no pelvic fracture or diastasis. Generator for a right-sided sacral  nerve stimulator projects over the right iliac crest. There is an abandoned left sacral lead projecting over the left sacral ala. IMPRESSION: Mild bilateral hip osteoarthrosis without acute fracture or dislocation. Electronically Signed   By: Ulyses Jarred M.D.   On: 09/19/2017 14:18   Dg Tibia/fibula Right  Result Date: 09/19/2017 CLINICAL DATA:   Right leg slammed in car door 4 days ago. EXAM: RIGHT TIBIA AND FIBULA - 2 VIEW COMPARISON:  None. FINDINGS: There is no evidence of fracture or other focal bone lesions. Soft tissues are unremarkable. IMPRESSION: No fracture or dislocation of the right tibia or fibula. Electronically Signed   By: Ulyses Jarred M.D.   On: 09/19/2017 14:20   Dg Femur Min 2 Views Right  Result Date: 09/19/2017 CLINICAL DATA:  Right leg slammed in car door 4 days ago. EXAM: RIGHT FEMUR 2 VIEWS COMPARISON:  None. FINDINGS: There is no evidence of fracture or other focal bone lesions. Soft tissues are unremarkable. There is mild narrowing of the medial femorotibial joint space with minimal osteophyte formation. No knee effusion. IMPRESSION: No fracture or dislocation of the right femur. Electronically Signed   By: Ulyses Jarred M.D.   On: 09/19/2017 14:20    Procedures Procedures (including critical care time)  Medications Ordered in ED Medications  HYDROmorphone (DILAUDID) injection 1 mg (1 mg Intramuscular Given 09/19/17 1410)  oxyCODONE-acetaminophen (PERCOCET/ROXICET) 5-325 MG per tablet 2 tablet (2 tablets Oral Given 09/19/17 1516)     Initial Impression / Assessment and Plan / ED Course  I have reviewed the triage vital signs and the nursing notes.  Pertinent labs & imaging results that were available during my care of the patient were reviewed by me and considered in my medical decision making (see chart for details).   No fracture. Ambulates with pain medicine and knee immobilizer. Will have her fu w/ pcp as needed. Pain meds increased in lieu of her appointment. Case management for PT consult.   Final Clinical Impressions(s) / ED Diagnoses   Final diagnoses:  Right leg pain    ED Discharge Orders        [place]     09/19/17 1554    Face-to-face encounter (required for Medicare/Medicaid patients)    Comments:  I Megean Fabio, Corene Cornea certify that this patient is under my care and that I,  or a nurse practitioner or physician's assistant working with me, had a face-to-face encounter that meets the physician face-to-face encounter requirements with this patient on 09/19/2017. The encounter with the patient was in whole, or in part for the following medical condition(s) which is the primary reason for home health care (List medical condition): worsening pain from a traumal. Further orders per pcp.   09/19/17 1554    oxyCODONE-acetaminophen (PERCOCET/ROXICET) 5-325 MG tablet  Every 6 hours PRN     09/19/17 1622       Damica Gravlin, Corene Cornea, MD 09/19/17 2116

## 2017-09-19 NOTE — ED Triage Notes (Signed)
Pt reports that her right leg was slammed in car door on Monday. Pt states wind caused door to slam on leg. Pain is in lower leg and radiates up leg. Pt states she is having a hard time getting around house

## 2017-09-19 NOTE — ED Notes (Signed)
Pt in rad 

## 2017-09-19 NOTE — Patient Outreach (Signed)
[facility]) Care Management  09/19/2017  Denise Freeman [facility] 09-23-1950 [id number]   Telephone call to the patient for follow up.  No answer.  HIPAA compliant voicemail left with contact information.  Pan:  RN Health Coach will make outreach attempt at the next scheduled interval.  [name] RN, BSN, [place] Direct Dial:  [phone]  Fax: [phone]

## 2017-09-19 NOTE — ED Notes (Signed)
Knee immobilizer applied   Awaiting reassess and dispo

## 2017-09-22 NOTE — Care Management (Signed)
Late Entry: CM received consult for [facility] referral. Referral sent to [facility] rep, Juliann Pulse. Pt aware HH has 48 hrs to make first visit.

## 2017-09-23 ENCOUNTER — Other Ambulatory Visit: Payer: Self-pay | Admitting: "Endocrinology

## 2017-09-23 DIAGNOSIS — S8011XA Contusion of right lower leg, initial encounter: Secondary | ICD-10-CM | POA: Diagnosis not present

## 2017-09-24 ENCOUNTER — Telehealth: Payer: Self-pay | Admitting: Orthopedic Surgery

## 2017-09-24 ENCOUNTER — Ambulatory Visit: Payer: Self-pay | Admitting: Internal Medicine

## 2017-09-24 NOTE — Telephone Encounter (Signed)
Spoke with patient about her referral from Urgent Care. I asked her if she had called Dr. Sid Falcon office in [place] and she stated she didn't know she was suppose to. I explained that we have no doctors in the office this week and it would be at least a couple of weeks before we could get her in to be seen. She stated she wasn't told about Dr. Lyla Glassing, she asked for his information and I gave it to her. She thanked me and hung up.

## 2017-09-25 ENCOUNTER — Telehealth: Payer: Self-pay

## 2017-09-25 NOTE — Telephone Encounter (Signed)
Placed verbal orders with Tommi Rumps.  [name], MD [place], PGY-3

## 2017-09-25 NOTE — Telephone Encounter (Signed)
Pt called nurse line, requesting to speak to Dr. Ola Spurr, would not provide any further information. Call back [phone] Wallace Cullens, RN

## 2017-09-25 NOTE — Telephone Encounter (Signed)
Called patient and reached voicemail. Let her know I had been following her husband's hospital course and am glad he is recovering from his pneumonia. Let her know I agree with GI referral to work-up severe GERD. Recommended making appointment with me for hospital follow-up at their convenience.  [name], MD [place], PGY-3

## 2017-09-25 NOTE — Telephone Encounter (Signed)
Tommi Rumps- PT with [facility] calling on behalf of patient. Her husband receives home PT and patient is requesting an evaluation for herself from PT following a recent fall. Tommi Rumps states he can do her eval if given verbal orders. His call back [phone] Wallace Cullens, RN

## 2017-09-29 ENCOUNTER — Ambulatory Visit: Payer: Self-pay | Admitting: Pulmonary Disease

## 2017-09-29 ENCOUNTER — Telehealth: Payer: Self-pay

## 2017-09-29 ENCOUNTER — Ambulatory Visit: Payer: Medicare Other

## 2017-09-29 DIAGNOSIS — I130 Hypertensive heart and chronic kidney disease with heart failure and stage 1 through stage 4 chronic kidney disease, or unspecified chronic kidney disease: Secondary | ICD-10-CM | POA: Diagnosis not present

## 2017-09-29 DIAGNOSIS — M79604 Pain in right leg: Secondary | ICD-10-CM | POA: Diagnosis not present

## 2017-09-29 NOTE — Telephone Encounter (Signed)
Amy, PT with [facility] called for verbal orders for home PT:  3x/wk x 1 and 2x/wk x 1 MSW eval for services.  Call back is [phone]  Danley Danker, RN [facility])

## 2017-09-29 NOTE — Telephone Encounter (Signed)
Left message for Amy with verbal orders.

## 2017-10-01 ENCOUNTER — Ambulatory Visit (INDEPENDENT_AMBULATORY_CARE_PROVIDER_SITE_OTHER): Payer: Medicare Other | Admitting: *Deleted

## 2017-10-01 DIAGNOSIS — I480 Paroxysmal atrial fibrillation: Secondary | ICD-10-CM | POA: Diagnosis not present

## 2017-10-01 DIAGNOSIS — Z5181 Encounter for therapeutic drug level monitoring: Secondary | ICD-10-CM | POA: Diagnosis not present

## 2017-10-01 LAB — POCT INR: INR: 2.3

## 2017-10-01 MED ORDER — WARFARIN SODIUM 4 MG PO TABS: ORAL_TABLET | ORAL | 4 refills | Status: DC

## 2017-10-01 NOTE — Patient Instructions (Signed)
Continue coumadin 1 tablet daily except 1 1/2 tablets on Saturdays Recheck in 4 weeks 

## 2017-10-02 ENCOUNTER — Ambulatory Visit: Payer: Medicare Other

## 2017-10-02 ENCOUNTER — Telehealth: Payer: Self-pay | Admitting: Family Medicine

## 2017-10-02 ENCOUNTER — Telehealth: Payer: Self-pay | Admitting: Internal Medicine

## 2017-10-02 ENCOUNTER — Ambulatory Visit (INDEPENDENT_AMBULATORY_CARE_PROVIDER_SITE_OTHER): Payer: Medicare Other | Admitting: Orthopaedic Surgery

## 2017-10-02 NOTE — Telephone Encounter (Signed)
Called patient back. Reached voicemail. I had approved PT by phone on 4/29. Asked patient to call back to let me know what issues she is having.  [name], MD [place], PGY-3

## 2017-10-02 NOTE — Telephone Encounter (Signed)
Pt said that she was told her [facility] orders had been removed? She is confused on who needs to be coming to her house for assistance. Pt also would like to know if there is someone able to come to help with sweeping and vacuuming floors because she is no longer able and her husband is in the hospital. Pt would like to be contacted by Dr Ola Spurr to clear up some confusion and answer her questions.

## 2017-10-02 NOTE — Telephone Encounter (Signed)
After Hours/Emergency Line Call  Received call from patient at 7 PM.  She endorsed frustration regarding [facility] physical therapy ordered by her PCP for her leg.  She was recently seen in the ED on 4/19 for right leg pain with no signs of fracture.  Patient was ambulating with pain medication and knee immobilizer.  She was instructed to follow-up with her PCP and PT consult was ordered by case management.  Patient states she is not pleased with her PT and feels like she is disrespected.  She endorsed thoughts of suicide but denies plans or actions regarding this.  I reassured patient she does not have to receive care she feels disrespected.  Discussed with patient her SI and reassured her that she is not alone and that we could take care of this problem with her.  [name] husband has appointment at clinic at 1:45 PM tomorrow.  Scheduled appointment at 2:10 PM for patient to discuss PT regarding her right leg.  Spoke at length regarding her SI and advised her to call 911 at any point if she makes any advancements regarding these thoughts.  Will forward to Dr. Cyndia Skeeters who is scheduled to see her on 10/03/2017.  Harriet Butte, [place], PGY-2

## 2017-10-03 ENCOUNTER — Encounter: Payer: Self-pay | Admitting: Student

## 2017-10-03 ENCOUNTER — Telehealth: Payer: Self-pay | Admitting: Internal Medicine

## 2017-10-03 ENCOUNTER — Other Ambulatory Visit: Payer: Self-pay

## 2017-10-03 ENCOUNTER — Ambulatory Visit (INDEPENDENT_AMBULATORY_CARE_PROVIDER_SITE_OTHER): Payer: Medicare Other | Admitting: Student

## 2017-10-03 VITALS — BP 104/58 | HR 40 | Temp 98.9°F | Ht 59.0 in | Wt 183.2 lb

## 2017-10-03 DIAGNOSIS — R2681 Unsteadiness on feet: Secondary | ICD-10-CM | POA: Diagnosis not present

## 2017-10-03 DIAGNOSIS — J449 Chronic obstructive pulmonary disease, unspecified: Secondary | ICD-10-CM

## 2017-10-03 DIAGNOSIS — I5022 Chronic systolic (congestive) heart failure: Secondary | ICD-10-CM

## 2017-10-03 DIAGNOSIS — Z789 Other specified health status: Secondary | ICD-10-CM

## 2017-10-03 DIAGNOSIS — G894 Chronic pain syndrome: Secondary | ICD-10-CM | POA: Diagnosis not present

## 2017-10-03 DIAGNOSIS — J441 Chronic obstructive pulmonary disease with (acute) exacerbation: Secondary | ICD-10-CM | POA: Diagnosis not present

## 2017-10-03 NOTE — Patient Instructions (Signed)
It was great seeing you today! I am sorry about your experience.  We have placed a new order for physical therapy and home health aide.  Someone will get in touch with you over the next 1 week.  We will also bring this to Dr. Blane Ohara attention when she is back in clinic.  If we did any lab work today, and the results require attention, either me or my nurse will get in touch with you. If everything is normal, you will get a letter in mail and a message via . If you don't hear from [place] in two weeks, please give [place] a call. Otherwise, we look forward to seeing you again at your next visit. If you have any questions or concerns before then, please call the clinic at [phone].  Please bring all your medications to every doctors visit  Sign up for My Chart to have easy access to your labs results, and communication with your Primary care physician.    Please check-out at the front desk before leaving the clinic.    Take Care,   Dr. Cyndia Skeeters
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
## 2017-10-03 NOTE — Telephone Encounter (Signed)
Pt came in office for appt, and requesting to be call by Dr.Fitzgerald. Regarding to [place]. Best Phone # to call is [phone].

## 2017-10-03 NOTE — Telephone Encounter (Signed)
**  After Hours/ Emergency Line Call*  Received a call to report that [place]  She reports that she is frustrated with her husband's physical therapist and would like to switch to advanced home care for him. Will forward to PCP.   Smiley Houseman, MD PGY-3, [facility] Res[facility]

## 2017-10-03 NOTE — Telephone Encounter (Signed)
Attempted to reach patient twice at number provided. Voicemail not set up. Called home phone and left message to let me know what her concerns are with home health. Recommended leaving message with [facility] staff so that I can know what's going on if we are unable to speak directly.  [name], MD PGY-3

## 2017-10-06 ENCOUNTER — Ambulatory Visit (INDEPENDENT_AMBULATORY_CARE_PROVIDER_SITE_OTHER): Payer: Medicare Other

## 2017-10-06 ENCOUNTER — Other Ambulatory Visit: Payer: Self-pay | Admitting: *Deleted

## 2017-10-06 ENCOUNTER — Other Ambulatory Visit: Payer: Self-pay | Admitting: Internal Medicine

## 2017-10-06 DIAGNOSIS — Z7409 Other reduced mobility: Secondary | ICD-10-CM

## 2017-10-06 DIAGNOSIS — E1165 Type 2 diabetes mellitus with hyperglycemia: Secondary | ICD-10-CM | POA: Diagnosis not present

## 2017-10-06 MED ORDER — LOSARTAN POTASSIUM 25 MG PO TABS: 12.5000 mg | ORAL_TABLET | Freq: Every day | ORAL | 2 refills | Status: DC

## 2017-10-06 NOTE — Telephone Encounter (Signed)
Placed HH Face-to-Face and orders for [facility] PT with request for [place] as requested.  [name], MD [place], PGY-3

## 2017-10-07 ENCOUNTER — Other Ambulatory Visit: Payer: Self-pay | Admitting: Internal Medicine

## 2017-10-07 ENCOUNTER — Ambulatory Visit (INDEPENDENT_AMBULATORY_CARE_PROVIDER_SITE_OTHER): Payer: Medicare Other | Admitting: Internal Medicine

## 2017-10-07 VITALS — BP 105/60 | HR 60 | Temp 98.7°F | Ht 59.0 in | Wt 181.0 lb

## 2017-10-07 DIAGNOSIS — Z7409 Other reduced mobility: Secondary | ICD-10-CM

## 2017-10-07 DIAGNOSIS — J9611 Chronic respiratory failure with hypoxia: Secondary | ICD-10-CM | POA: Diagnosis not present

## 2017-10-07 DIAGNOSIS — M79604 Pain in right leg: Secondary | ICD-10-CM

## 2017-10-07 DIAGNOSIS — F0630 Mood disorder due to known physiological condition, unspecified: Secondary | ICD-10-CM

## 2017-10-07 NOTE — Patient Instructions (Signed)
Mrs. Piercey,  I am so sorry you've been under so much stress.  I highly recommend scheduling with your psychiatrist as soon as possible to discuss strategies for coping with the increased stress.  I will look into the status of physical therapy with advanced home care.   Best, Dr. Ola Spurr  When you're going through tough times, it's easy to feel lonely and overwhelmed. Remember that YOU ARE NOT ALONE and we at [facility] want to support you during this difficult time.  There are many ways to get help and support when you are ready.  You can call the following help lines: - National suicide hotline 203 850 0398) - [place] (1-800-SUICIDE)  You can schedule an appointment with a team member at [facility] - your primary care doc or one of our counselors.  We are all here to support you. A doctor is on call 24/7, so call us if you need to [phone]).  There are many treatments that can help people during difficult times, and we can talk with you about medications, counseling, diet, and other options. We want to offer you HOPE that it won't always feel this bad.  If you are seriously thinking about hurting yourself or have a plan, please call 911 or go to any emergency room right away for immediate help.

## 2017-10-08 ENCOUNTER — Other Ambulatory Visit: Payer: Self-pay | Admitting: Internal Medicine

## 2017-10-08 ENCOUNTER — Encounter (HOSPITAL_COMMUNITY): Payer: Self-pay | Admitting: Emergency Medicine

## 2017-10-08 ENCOUNTER — Other Ambulatory Visit: Payer: Self-pay | Admitting: Pulmonary Disease

## 2017-10-08 ENCOUNTER — Other Ambulatory Visit (HOSPITAL_COMMUNITY): Payer: Self-pay | Admitting: Psychiatry

## 2017-10-08 ENCOUNTER — Other Ambulatory Visit: Payer: Self-pay | Admitting: "Endocrinology

## 2017-10-08 ENCOUNTER — Emergency Department (HOSPITAL_COMMUNITY)
Admission: EM | Admit: 2017-10-08 | Discharge: 2017-10-08 | Disposition: A | Discharge status: Home / Self Care | Payer: Medicare Other | Attending: Emergency Medicine | Admitting: Emergency Medicine

## 2017-10-08 ENCOUNTER — Encounter: Payer: Self-pay | Admitting: Internal Medicine

## 2017-10-08 ENCOUNTER — Emergency Department (HOSPITAL_COMMUNITY): Payer: Medicare Other

## 2017-10-08 ENCOUNTER — Other Ambulatory Visit: Payer: Self-pay

## 2017-10-08 ENCOUNTER — Telehealth (HOSPITAL_COMMUNITY): Payer: Self-pay | Admitting: *Deleted

## 2017-10-08 DIAGNOSIS — I130 Hypertensive heart and chronic kidney disease with heart failure and stage 1 through stage 4 chronic kidney disease, or unspecified chronic kidney disease: Secondary | ICD-10-CM | POA: Insufficient documentation

## 2017-10-08 DIAGNOSIS — R0602 Shortness of breath: Secondary | ICD-10-CM | POA: Diagnosis not present

## 2017-10-08 DIAGNOSIS — Z794 Long term (current) use of insulin: Secondary | ICD-10-CM | POA: Insufficient documentation

## 2017-10-08 DIAGNOSIS — R0789 Other chest pain: Secondary | ICD-10-CM | POA: Diagnosis not present

## 2017-10-08 DIAGNOSIS — I5042 Chronic combined systolic (congestive) and diastolic (congestive) heart failure: Secondary | ICD-10-CM | POA: Insufficient documentation

## 2017-10-08 DIAGNOSIS — N183 Chronic kidney disease, stage 3 (moderate): Secondary | ICD-10-CM | POA: Insufficient documentation

## 2017-10-08 DIAGNOSIS — R079 Chest pain, unspecified: Secondary | ICD-10-CM | POA: Diagnosis not present

## 2017-10-08 DIAGNOSIS — K219 Gastro-esophageal reflux disease without esophagitis: Secondary | ICD-10-CM

## 2017-10-08 DIAGNOSIS — R2681 Unsteadiness on feet: Secondary | ICD-10-CM | POA: Diagnosis not present

## 2017-10-08 DIAGNOSIS — Z87891 Personal history of nicotine dependence: Secondary | ICD-10-CM | POA: Diagnosis not present

## 2017-10-08 DIAGNOSIS — E1122 Type 2 diabetes mellitus with diabetic chronic kidney disease: Secondary | ICD-10-CM | POA: Diagnosis not present

## 2017-10-08 DIAGNOSIS — I4891 Unspecified atrial fibrillation: Secondary | ICD-10-CM | POA: Diagnosis not present

## 2017-10-08 DIAGNOSIS — M79604 Pain in right leg: Secondary | ICD-10-CM | POA: Insufficient documentation

## 2017-10-08 DIAGNOSIS — E876 Hypokalemia: Secondary | ICD-10-CM | POA: Diagnosis not present

## 2017-10-08 DIAGNOSIS — Z79899 Other long term (current) drug therapy: Secondary | ICD-10-CM | POA: Insufficient documentation

## 2017-10-08 LAB — COMPREHENSIVE METABOLIC PANEL
ALBUMIN: 4.3 g/dL (ref 3.5–5.0)
ALT: 9 U/L — ABNORMAL LOW (ref 14–54)
ANION GAP: 14 (ref 5–15)
AST: 17 U/L (ref 15–41)
Alkaline Phosphatase: 46 U/L (ref 38–126)
BUN: 58 mg/dL — AB (ref 6–20)
CALCIUM: 8.9 mg/dL (ref 8.9–10.3)
CHLORIDE: 89 mmol/L — AB (ref 101–111)
CO2: 35 mmol/L — ABNORMAL HIGH (ref 22–32)
Creatinine, Ser: 1.56 mg/dL — ABNORMAL HIGH (ref 0.44–1.00)
GFR calc Af Amer: 39 mL/min — ABNORMAL LOW (ref >60)
GFR, EST NON AFRICAN AMERICAN: 34 mL/min — AB (ref >60)
Glucose, Bld: 125 mg/dL — ABNORMAL HIGH (ref 65–99)
POTASSIUM: 3 mmol/L — AB (ref 3.5–5.1)
Sodium: 138 mmol/L (ref 135–145)
TOTAL PROTEIN: 7.7 g/dL (ref 6.5–8.1)
Total Bilirubin: 1 mg/dL (ref 0.3–1.2)

## 2017-10-08 LAB — CBC WITH DIFFERENTIAL/PLATELET
BASOS ABS: 0 10*3/uL (ref 0.0–0.1)
BASOS PCT: 0 %
EOS PCT: 2 %
Eosinophils Absolute: 0.1 10*3/uL (ref 0.0–0.7)
HCT: 34.2 % — ABNORMAL LOW (ref 36.0–46.0)
Hemoglobin: 10.8 g/dL — ABNORMAL LOW (ref 12.0–15.0)
Lymphocytes Relative: 22 %
Lymphs Abs: 1.3 10*3/uL (ref 0.7–4.0)
MCH: 27.4 pg (ref 26.0–34.0)
MCHC: 31.6 g/dL (ref 30.0–36.0)
MCV: 86.8 fL (ref 78.0–100.0)
MONO ABS: 0.4 10*3/uL (ref 0.1–1.0)
MONOS PCT: 7 %
Neutro Abs: 3.9 10*3/uL (ref 1.7–7.7)
Neutrophils Relative %: 69 %
PLATELETS: 182 10*3/uL (ref 150–400)
RBC: 3.94 MIL/uL (ref 3.87–5.11)
RDW: 12.3 % (ref 11.5–15.5)
WBC: 5.7 10*3/uL (ref 4.0–10.5)

## 2017-10-08 LAB — TROPONIN I

## 2017-10-08 MED ORDER — POTASSIUM CHLORIDE ER 10 MEQ PO TBCR: 10.0000 meq | EXTENDED_RELEASE_TABLET | Freq: Every day | ORAL | 0 refills | Status: DC

## 2017-10-08 MED ORDER — POTASSIUM CHLORIDE CRYS ER 20 MEQ PO TBCR
40.0000 meq | EXTENDED_RELEASE_TABLET | Freq: Once | ORAL | Status: AC
  Administered 2017-10-08: 40 meq via ORAL
  Filled 2017-10-08: qty 2

## 2017-10-08 MED ORDER — CEPHALEXIN 500 MG PO CAPS: 500.0000 mg | ORAL_CAPSULE | Freq: Four times a day (QID) | ORAL | 0 refills | Status: DC

## 2017-10-08 MED ORDER — TRAMADOL HCL 50 MG PO TABS: 50.0000 mg | ORAL_TABLET | Freq: Four times a day (QID) | ORAL | 0 refills | Status: DC | PRN

## 2017-10-08 NOTE — Assessment & Plan Note (Signed)
-   Patient having difficulty coping with current stress of husband recovering from hospitalization and has increased somatic complaints. No active SI/HI but expresses hopelessness for the future and is very irritable during visit. - Offered counseling support but patient declines, saying it won't help. - Recommended follow-up with psychiatrist as soon as possible given some concern for hypomania given degree of agitation. Patient and her husband expressed understanding. - Provided number for suicide hotline and counseled patient and her husband that they should contact 911 if patient has suicidal thoughts.

## 2017-10-08 NOTE — Evaluation (Signed)
Physical Therapy Evaluation Patient Details Name: Denise Freeman MRN: [id number] DOB: 06-08-1950 Today's Date: 10/08/2017   History of Present Illness  Denise Freeman is a 67 y/o female brought in via EMS for multiple complaints, chest pain, fibromyalgia, hasn't had her pain medicine, under a lot of stress, cough, nasal discharge and generalized pain.    Clinical Impression  Patient functioning near baseline for functional mobility and gait, other than having difficulty sitting up on side of gurney and getting back onto gurney due to c/o chronic right hip pain, able to sit to stand, transfer and ambulate in hallway without loss of balance.  Plan: patient discharged from physical therapy to care of nursing for ambulation daily as tolerated for length of stay and recommendations stated below.     Follow Up Recommendations Home health PT;Supervision - Intermittent    Equipment Recommendations  None recommended by PT    Recommendations for Other Services       Precautions / Restrictions Precautions Precautions: None Restrictions Weight Bearing Restrictions: No      Mobility  Bed Mobility Overal bed mobility: Needs Assistance Bed Mobility: Supine to Sit;Sit to Supine     Supine to sit: Min guard Sit to supine: Min assist   General bed mobility comments: has difficulty getting out of/into gurney due to high off floor and chronic left hip pain  Transfers Overall transfer level: Modified independent Equipment used: Rolling walker (2 wheeled);None             General transfer comment: demonstrates good return for sit to stands with RW or without assisitve device  Ambulation/Gait Ambulation/Gait assistance: Modified independent (Device/Increase time) Ambulation Distance (Feet): 75 Feet Assistive device: Rolling walker (2 wheeled) Gait Pattern/deviations: Decreased step length - right;Decreased step length - left;Decreased stride length Gait velocity: slow   General Gait  Details: slow slightly labored cadence without loss of balance, limited secondary to fatigue/left hip discomfort  Stairs            Wheelchair Mobility    Modified Rankin (Stroke Patients Only)       Balance Overall balance assessment: Needs assistance Sitting-balance support: Bilateral upper extremity supported;Feet unsupported Sitting balance-Leahy Scale: Good     Standing balance support: Bilateral upper extremity supported;During functional activity Standing balance-Leahy Scale: Fair Standing balance comment: fair/good without using assistive device and with RW                             Pertinent Vitals/Pain Pain Assessment: 0-10 Pain Score: 2  Pain Location: left side chest Pain Descriptors / Indicators: Discomfort Pain Intervention(s): Limited activity within patient's tolerance;Monitored during session    Home Living Family/patient expects to be discharged to:: Private residence Living Arrangements: Spouse/significant other Available Help at Discharge: Family Type of Home: Mobile home Home Access: Ramped entrance     Home Layout: One level Home Equipment: Environmental consultant - 2 wheels;Cane - single point;Bedside commode;Shower seat;Electric scooter      Prior Function Level of Independence: Independent with assistive device(s)               Hand Dominance        Extremity/Trunk Assessment   Upper Extremity Assessment Upper Extremity Assessment: Overall WFL for tasks assessed    Lower Extremity Assessment Lower Extremity Assessment: Overall WFL for tasks assessed    Cervical / Trunk Assessment Cervical / Trunk Assessment: Normal  Communication   Communication: No difficulties  Cognition Arousal/Alertness:  Awake/alert Behavior During Therapy: WFL for tasks assessed/performed Overall Cognitive Status: Within Functional Limits for tasks assessed                                        General Comments       Exercises     Assessment/Plan    PT Assessment All further PT needs can be met in the next venue of care  PT Problem List Decreased strength;Decreased activity tolerance;Decreased balance;Decreased mobility       PT Treatment Interventions      PT Goals (Current goals can be found in the Care Plan section)  Acute Rehab PT Goals Patient Stated Goal: Return home able to take care of her spouse PT Goal Formulation: With patient Time For Goal Achievement: Nov 01, 2017 Potential to Achieve Goals: Good    Frequency     Barriers to discharge        Co-evaluation               AM-PAC PT "6 Clicks" Daily Activity  Outcome Measure Difficulty turning over in bed (including adjusting bedclothes, sheets and blankets)?: None Difficulty moving from lying on back to sitting on the side of the bed? : A Little Difficulty sitting down on and standing up from a chair with arms (e.g., wheelchair, bedside commode, etc,.)?: None Help needed moving to and from a bed to chair (including a wheelchair)?: None Help needed walking in hospital room?: A Little Help needed climbing 3-5 steps with a railing? : A Little 6 Click Score: 21    End of Session Equipment Utilized During Treatment: Oxygen Activity Tolerance: Patient tolerated treatment well;Patient limited by fatigue;Patient limited by pain Patient left: in bed;Other (comment)(patient left in gurney) Nurse Communication: Mobility status PT Visit Diagnosis: Unsteadiness on feet (R26.81);Other abnormalities of gait and mobility (R26.89);History of falling (Z91.81)    Time: 9518-8416 PT Time Calculation (min) (ACUTE ONLY): 29 min   Charges:   PT Evaluation $PT Eval Moderate Complexity: 1 Mod PT Treatments $Therapeutic Activity: 23-37 mins   PT G Codes:        2:39 PM, 2017/11/01 Lonell Grandchild, MPT Physical Therapist with [facility] [phone] office [phone] mobile phone

## 2017-10-08 NOTE — Telephone Encounter (Signed)
Called twice. Although her husband took the phone, the patient is not available. When she returns the call to [place], please inform that I believe it is the best to discuss at the next appointment as I have not seen her for a few months. Please also remind her of available resources if needed (I.e. Emergency room).
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
## 2017-10-08 NOTE — Assessment & Plan Note (Signed)
-   Patient desaturated to 88% on RA with ambulation. - Considering weaning down on O2 as able under Pulmonology's supervision.  - Will fax this office note to patient's oxygen supplier.

## 2017-10-08 NOTE — ED Provider Notes (Signed)
[facility] EMERGENCY DEPARTMENT Provider Note   CSN: [id number] Arrival date & time: 10/08/17  1255     History   Chief Complaint Chief Complaint  Patient presents with  . Chest Pain    HPI Denise Freeman is a 67 y.o. female.  Patient complains of some chest discomfort.  Patient also has had a cough with yellow sputum production.  The history is provided by the patient.  Chest Pain   This is a new problem. The current episode started more than 1 week ago. The problem occurs constantly. The problem has not changed since onset.The pain is associated with coughing. The pain is present in the substernal region. The pain is at a severity of 4/10. The pain is moderate. The quality of the pain is described as dull. The pain does not radiate. Associated symptoms include cough. Pertinent negatives include no abdominal pain, no back pain and no headaches.  Pertinent negatives for past medical history include no seizures.    Past Medical History:  Diagnosis Date  . Allergy   . Anemia   . Anxiety   . Asthma   . Atrial fibrillation ([place])   . Bipolar 1 disorder ([place])   . 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 Cholelithiasis    a. 09/2016 s/p Lap Chole.  . Chronic bronchitis ([place])   . Chronic combined systolic and diastolic congestive heart failure ([name])    a. 09/2016 Echo: EF 30-35%.  . Colon polyps   . COPD (chronic obstructive pulmonary disease) ([place])   . Depression   . Family history of adverse reaction to anesthesia    Uncle was positive for malignant hyperthermia; patient had testing done and was negative.  . Fibromyalgia   . GERD (gastroesophageal reflux disease)   . Gout   . High cholesterol   . History of blood transfusion 06/2015   "bleeding from my rectum"  . History of hiatal hernia   . HOH (hard of hearing)   . Hx of colonic polyps 03/21/2016   3 small adenomas no recall - co-morbidities  . Neuropathy    Disc Back   . NICM (nonischemic cardiomyopathy)  ([place])    a. Previously worked up in [place], [place]-->low EF with subsequent recovery.  Multiple caths (last ~ 2014 per pt report)--reportedly nl cors;  b. 09/2016 Echo: EF 30-35%, antsept/apical HK, mild MR, mildly dil LA, mod dil RA;  c. 09/2016 Lexi MV: EF 26%, glob HK, sept DK, med size, mod intensity fixed septal defect - BBB/PVC related artifact, no ischemia.  . On home oxygen therapy    "3L; 24/7" (03/14/2016)  . OSA treated with BiPAP    uses biPAP, 10 (03/14/2016)  . Osteoarthritis   . Oxygen deficiency   . Pneumonia   . Type II diabetes mellitus [facility])     Patient Active Problem List   Diagnosis Date Noted  . Right leg pain 10/08/2017  . Medically complex patient 10/03/2017  . Chronic pain syndrome 10/03/2017  . Unstable gait 10/03/2017  . Diarrhea 07/29/2017  . Type 2 diabetes mellitus with stage 3 chronic kidney disease, with long-term current use of insulin ([place]) 04/01/2017  . Essential hypertension, benign 04/01/2017  . Mood disorder in conditions classified elsewhere 03/31/2017  . Chronic diastolic congestive heart failure ([place]) 03/12/2017  . Chronic systolic congestive heart failure ([place]) 11/05/2016  . Pressure injury of skin 09/09/2016  . NICM (nonischemic cardiomyopathy) ([place])   . Class 2 severe obesity due  to excess calories with serious comorbidity and body mass index (BMI) of 38.0 to 38.9 in adult [facility])   . Acute systolic congestive heart failure ([place])   . S/P laparoscopic cholecystectomy   . Heart failure ([place]) 09/06/2016  . Dyslipidemia 08/07/2016  . Vision loss 04/19/2016  . Hx of colonic polyps 03/21/2016  . Blood in stool   . Benign neoplasm of cecum   . Benign neoplasm of rectum   . CKD (chronic kidney disease), stage III ([place]) 03/14/2016  . Mixed hyperlipidemia 03/14/2016  . Scalp lesion 03/14/2016  . Peripheral polyneuropathy 01/26/2016  . Vitamin D deficiency 09/26/2015  . DM type 2 causing vascular disease ([place]) 08/28/2015  . Chronic upper GI  bleeding 06/24/2015  . COPD with asthma ([place]) 02/22/2015  . OSA (obstructive sleep apnea) 08/22/2014  . Other emphysema ([place]) 05/29/2014  . Chronic hypoxic, hypercapnic respiratory failure 04/21/2014  . Microcytic anemia 04/21/2014  . Fibromyalgia 04/14/2014  . Uncontrolled secondary diabetes mellitus with stage 3 CKD (GFR 30-59) (H[facility]) 04/14/2014  . Dyspnea on exertion 04/14/2014  . Acute on chronic combined systolic and diastolic CHF (congestive heart failure) ([place]) 04/14/2014  . Atrial fibrillation [I48.91] 04/12/2014    Past Surgical History:  Procedure Laterality Date  . APPENDECTOMY     "they busted"  . bladder stimulator     pt states, "it cannot be turned off; it's in my right hip; dead battery so it's not working anymore". (03/14/2016)  . BLADDER SUSPENSION     2003, 2006 and 2010  . CATARACT EXTRACTION W/PHACO Right 11/29/2014   Procedure: CATARACT EXTRACTION PHACO AND INTRAOCULAR LENS PLACEMENT (IOC);  Surgeon: Rutherford Guys, MD;  Location: [facility];  Service: Ophthalmology;  Laterality: Right;  CDE:3.81  . CATARACT EXTRACTION W/PHACO Left 12/13/2014   Procedure: CATARACT EXTRACTION PHACO AND INTRAOCULAR LENS PLACEMENT (IOC);  Surgeon: Rutherford Guys, MD;  Location: [facility];  Service: Ophthalmology;  Laterality: Left;  CDE:6.59  . CERVICAL DISC SURGERY N/A 2009   4, 6, and 7 cervical disc replaced  . CHOLECYSTECTOMY N/A 09/13/2016   Procedure: LAPAROSCOPIC CHOLECYSTECTOMY;  Surgeon: Rolm Bookbinder, MD;  Location: [place];  Service: General;  Laterality: N/A;  . COLONOSCOPY WITH PROPOFOL N/A 03/15/2016   Procedure: COLONOSCOPY WITH PROPOFOL;  Surgeon: Gatha Mayer, MD;  Location: [place];  Service: Endoscopy;  Laterality: N/A;  . ESOPHAGOGASTRODUODENOSCOPY (EGD) WITH PROPOFOL N/A 03/15/2016   Procedure: ESOPHAGOGASTRODUODENOSCOPY (EGD) WITH PROPOFOL;  Surgeon: Gatha Mayer, MD;  Location: [place];  Service: Endoscopy;  Laterality: N/A;  . HEEL SPUR SURGERY Bilateral     . HERNIA REPAIR    . I&D EXTREMITY Right 06/13/2015   Procedure: MINOR IRRIGATION AND DEBRIDEMENT EXTREMITY REMOVAL OF NAIL;  Surgeon: Daryll Brod, MD;  Location: [place];  Service: Orthopedics;  Laterality: Right;  . TUBAL LIGATION    . UMBILICAL HERNIA REPAIR     w/mesh     OB History   None      Home Medications    Prior to Admission medications   Medication Sig Start Date End Date Taking? Authorizing Provider  albuterol (PROVENTIL) (2.5 MG/3ML) 0.083% nebulizer solution USE (1) IN NEBULIZER EVERY 6 HOURS AS NEEDED FOR SHORTNESS OF BREATH. 07/17/17   Chesley Mires, MD  cetirizine (ZYRTEC) 10 MG tablet Take 1 tablet (10 mg total) by mouth at bedtime. 01/30/17   Rogue Bussing, MD  cholecalciferol (VITAMIN D) 400 units TABS tablet Take 1 tablet (400 Units total) by mouth 2 (two)  times daily. 07/17/17   Rogue Bussing, MD  Continuous Blood Gluc Sensor (FREESTYLE LIBRE SENSOR SYSTEM) MISC Use one sensor every 10 days. 04/30/17   Cassandria Anger, MD  docusate sodium (COLACE) 100 MG capsule Take 100 mg by mouth daily.    [provider]  escitalopram (LEXAPRO) 10 MG tablet Take 1 tablet (10 mg total) by mouth at bedtime. 07/31/17   Norman Clay, MD  fenofibrate 54 MG tablet TAKE 1 TABLET BY MOUTH ONCE DAILY. 07/17/17   Cassandria Anger, MD  ferrous sulfate 324 (65 Fe) MG TBEC Take 1 tablet by mouth daily.    [provider]  furosemide (LASIX) 40 MG tablet Take 2 tablets (80 mg total) by mouth 2 (two) times daily. 10/08/16   Hilty, Nadean Corwin, MD  Insulin Glargine (BASAGLAR KWIKPEN) 100 UNIT/ML SOPN Inject 0.5 mLs (50 Units total) into the skin at bedtime. 08/14/17   Cassandria Anger, MD  insulin lispro (HUMALOG KWIKPEN) 100 UNIT/ML KiwkPen Inject 0.15-0.21 mLs (15-21 Units total) into the skin 3 (three) times daily. 08/14/17   Cassandria Anger, MD  Insulin Pen Needle 32G X 4 MM MISC Use to inject insulin 5 times daily  01/27/17   Philemon Kingdom, MD  ipratropium (ATROVENT) 0.02 % nebulizer solution USE (1) IN NEBULIZER EVERY 6 HOURS AS NEEDED FOR SHORTNESS OF BREATH. 07/17/17   Chesley Mires, MD  losartan (COZAAR) 25 MG tablet Take 0.5 tablets (12.5 mg total) by mouth daily. 10/06/17 01/04/18  Pixie Casino, MD  metolazone (ZAROXOLYN) 2.5 MG tablet Take 1 tablet (2.5 mg total) by mouth daily. Take 30 minutes prior to morning LASIX 06/13/17   Hilty, Nadean Corwin, MD  metoprolol succinate (TOPROL-XL) 25 MG 24 hr tablet Take 1 tablet (25 mg total) by mouth 2 (two) times daily. 10/21/16   Hilty, Nadean Corwin, MD  mirtazapine (REMERON) 15 MG tablet Take 1 tablet (15 mg total) by mouth at bedtime. 07/31/17   Norman Clay, MD  modafinil (PROVIGIL) 200 MG tablet Take 1 tablet (200 mg total) by mouth daily. 10/10/16   Chesley Mires, MD  mometasone (NASONEX) 50 MCG/ACT nasal spray Place 1 spray into the nose daily. 01/10/17   Chesley Mires, MD  montelukast (SINGULAIR) 10 MG tablet TAKE ONE TABLET BY MOUTH AT BEDTIME. 07/17/17   Chesley Mires, MD  mupirocin cream (BACTROBAN) 2 % Apply 1 application topically 2 (two) times daily. 06/10/17   Rogue Bussing, MD  nitroGLYCERIN (NITROSTAT) 0.4 MG SL tablet Place 1 tablet (0.4 mg total) under the tongue every 5 (five) minutes as needed for chest pain. 10/31/15   Hilty, Nadean Corwin, MD  nystatin (NYSTATIN) powder Apply topically 2 (two) times daily. Apply under breasts 10/09/16   Rogue Bussing, MD  oxyCODONE-acetaminophen (PERCOCET/ROXICET) 5-325 MG tablet Take 2 tablets by mouth every 6 (six) hours as needed for severe pain. 09/19/17   Mesner, Corene Cornea, MD  OXYGEN Inhale 3 L into the lungs continuous. 3 liters 24/7    [provider]  pantoprazole (PROTONIX) 20 MG tablet TAKE (1) TABLET BY MOUTH TWICE A DAY BEFORE MEALS. (BREAKFAST AND SUPPER) 10/08/17   Rogue Bussing, MD  potassium chloride (K-DUR) 10 MEQ tablet Take 1 tablet (10 mEq total) by mouth daily. 10/08/17    Milton Ferguson, MD  PRESCRIPTION MEDICATION Inhale into the lungs See admin instructions. Use BIPAP every time laying down    [provider]  primidone (MYSOLINE) 50 MG tablet Take 1 tablet (50 mg  total) by mouth at bedtime. 11/05/16   Rogue Bussing, MD  Probiotic Product (PROBIOTIC PO) Take 1 tablet by mouth at bedtime.    [provider]  QUEtiapine (SEROQUEL) 25 MG tablet Take 1 tablet (25 mg total) by mouth at bedtime. 07/31/17   Norman Clay, MD  traMADol (ULTRAM) 50 MG tablet Take 1 tablet (50 mg total) by mouth every 6 (six) hours as needed. 10/08/17   Milton Ferguson, MD  triamcinolone lotion (KENALOG) 0.1 % Apply 1 application topically 3 (three) times daily. For up to 14 days and as needed 01/26/16   [place], Betty G, MD  TRUE [facility] BLOOD GLUCOSE TEST test strip USE TO TEST BLOOD SUGAR AS DIRECTED. 09/24/17   Cassandria Anger, MD  vitamin A 10000 UNIT capsule Take 10,000 Units by mouth every morning.    [provider]  warfarin (COUMADIN) 4 MG tablet Take 1 tablet daily except 1 1/2 tablets on Saturdays 10/01/17   Hilty, Nadean Corwin, MD    Family History Family History  Problem Relation Age of Onset  . Heart disease Mother   . COPD Mother   . Diabetes Mother   . Breast cancer Mother   . Heart disease Father   . Hyperlipidemia Father   . COPD Sister   . Heart disease Sister   . Diabetes Sister   . Heart disease Maternal Grandmother   . Cancer Maternal Grandmother        stomach  . Cancer Maternal Grandfather        lung   . Bipolar disorder Brother     Social History Social History   Tobacco Use  . Smoking status: Former Smoker    Packs/day: 2.00    Years: 14.00    Pack years: 28.00    Types: Cigarettes    Last attempt to quit: 04/12/1989    Years since quitting: 28.5  . Smokeless tobacco: Never Used  Substance Use Topics  . Alcohol use: No    Alcohol/week: 0.0 oz  . Drug use: No     Allergies   Xarelto [rivaroxaban];  Ancef [cefazolin]; Levaquin [levofloxacin in d5w]; Lyrica [pregabalin]; Tamiflu [oseltamivir phosphate]; Zoloft [sertraline hcl]; Augmentin [amoxicillin-pot clavulanate]; Ciprofloxacin; Haldol [haloperidol]; Nsaids; Penicillins; and Topamax [topiramate]   Review of Systems Review of Systems  Constitutional: Negative for appetite change and fatigue.  HENT: Negative for congestion, ear discharge and sinus pressure.   Eyes: Negative for discharge.  Respiratory: Positive for cough.   Cardiovascular: Positive for chest pain.  Gastrointestinal: Negative for abdominal pain and diarrhea.  Genitourinary: Negative for frequency and hematuria.  Musculoskeletal: Negative for back pain.  Skin: Negative for rash.  Neurological: Negative for seizures and headaches.  Psychiatric/Behavioral: Negative for hallucinations.     Physical Exam Updated Vital Signs BP (!) 113/47   Pulse 63   Temp 98.5 F (36.9 C) (Oral)   Resp 17   Ht 4\' 11"  (1.499 m)   Wt 82.1 kg (181 lb)   SpO2 100%   BMI 36.56 kg/m   Physical Exam  Constitutional: She is oriented to person, place, and time. She appears well-developed.  HENT:  Head: Normocephalic.  Eyes: Conjunctivae and EOM are normal. No scleral icterus.  Neck: Neck supple. No thyromegaly present.  Cardiovascular: Normal rate and regular rhythm. Exam reveals no gallop and no friction rub.  No murmur heard. Pulmonary/Chest: No stridor. She has wheezes. She has no rales. She exhibits no tenderness.  Abdominal: She exhibits no distension. There is  no tenderness. There is no rebound.  Musculoskeletal: Normal range of motion. She exhibits no edema.  Lymphadenopathy:    She has no cervical adenopathy.  Neurological: She is oriented to person, place, and time. She exhibits normal muscle tone. Coordination normal.  Skin: No rash noted. No erythema.  Psychiatric: She has a normal mood and affect. Her behavior is normal.     ED Treatments / Results  Labs (all  labs ordered are listed, but only abnormal results are displayed) Labs Reviewed  CBC WITH DIFFERENTIAL/PLATELET - Abnormal; Notable for the following components:      Result Value   Hemoglobin 10.8 (*)    HCT 34.2 (*)    All other components within normal limits  COMPREHENSIVE METABOLIC PANEL - Abnormal; Notable for the following components:   Potassium 3.0 (*)    Chloride 89 (*)    CO2 35 (*)    Glucose, Bld 125 (*)    BUN 58 (*)    Creatinine, Ser 1.56 (*)    ALT 9 (*)    GFR calc non Af Amer 34 (*)    GFR calc Af Amer 39 (*)    All other components within normal limits  TROPONIN I    EKG None  Radiology Dg Chest 2 View  Result Date: 10/08/2017 CLINICAL DATA:  Chest and back pain, mostly on the LEFT. Shortness of breath. EXAM: CHEST - 2 VIEW COMPARISON:  Chest radiograph 09/13/2016. FINDINGS: The heart is enlarged. There is no consolidation or edema. No effusion or pneumothorax. Bones unremarkable. Previous cervical fusion. Improved aeration from priors. IMPRESSION: Cardiomegaly. No consolidation or edema. Electronically Signed   By: Staci Righter M.D.   On: 10/08/2017 15:01    Procedures Procedures (including critical care time)  Medications Ordered in ED Medications  potassium chloride SA (K-DUR,KLOR-CON) CR tablet 40 mEq (40 mEq Oral Given 10/08/17 1524)     Initial Impression / Assessment and Plan / ED Course  I have reviewed the triage vital signs and the nursing notes.  Pertinent labs & imaging results that were available during my care of the patient were reviewed by me and considered in my medical decision making (see chart for details).     Patient with bronchitis and hypokalemia.  She will be given some potassium and antibiotics and will follow up with her PCP  Final Clinical Impressions(s) / ED Diagnoses   Final diagnoses:  Hypokalemia    ED Discharge Orders        Ordered    potassium chloride (K-DUR) 10 MEQ tablet  Daily     10/08/17 1528     traMADol (ULTRAM) 50 MG tablet  Every 6 hours PRN     10/08/17 1528       Milton Ferguson, MD 10/08/17 1537

## 2017-10-08 NOTE — Assessment & Plan Note (Signed)
-   Patient able to bear weight and recent negative imaging for fracture. Patient open to physical therapy. Acute stress of husband's hospitalization seems to be contributing to patient's pain. - Will fax PT request and rolling walker order to [facility] in [place]. Will also request [facility] aide per patient's request given limited mobility, though likely will not be covered as patient has Medicare only.  - Recommended patient make an appointment with her psychiatrist as soon as possible to discuss current stress and strategies for coping.

## 2017-10-08 NOTE — ED Triage Notes (Signed)
Patient brought in via EMS for multiple complaints, chest pain, fibromyalgia, hasn't had her pain medicine, under a lot of stress, cough, nasal discharge and generalized pain.

## 2017-10-08 NOTE — Telephone Encounter (Signed)
Rx(s) sent to pharmacy electronically.  

## 2017-10-08 NOTE — Progress Notes (Signed)
[name] Progress Note  Subjective:  Denise Freeman is a 67 y.o. female with history of COPD and OHS on 3L O2, mood disorder, T2DM, CKD, combined CHF, afib on coumadin, HTN, and HLD who presents for ongoing right hip pain and stress.  Patient was seen in the ED 09/19/17 several days after hitting right leg with car door and having ongoing pain. She had negative xrays at that time for hip, femur, or tib/fib fracture. She was given increased amount of pain medication at the time, which she says did not go over well at her pain clinic. She says she needs percocet 4 times a day now due to her increased pain, whereas pain doctor will only prescribe twice daily. Patient says she does not think she will go back to that pain clinic and that she knows it is difficult to find a pain clinic and does not want another referral. She reports having little hope that her pain will improve. She also reports concern that she flared up slipped discs in her back when she hurt her leg. She has been able to bear weight and walk with walker. She says she has to bite down on something at times to keep from screaming due to pain and that neighbors have complained about her making too much noise. Patient also expresses that her stress has been very intense since her husband was hospitalized for pneumonia last month and since his return home. She thinks he is not improving (her husband disagrees) and that things will only get worse. When asked how this provider can help, she says she doesn't think anyone can help. She reports feeling like her pain and her body will never feel better. She denies wanting to hurt herself or having a plan but that at this point she would be better off dead due to her pain. She would like to try PT but becomes upset when discussing [facility] that was coming to house because they "talked to me like a dog" and told her to "shut up." She would like for [facility] to be her provider and would like a  rolling walker of her own, as she is having to borrow her husband's to get around now. Her husband says her pain seems to be better when she is up and walking and worse when she is not active. He says she sometimes refused to participate with PT if she was hurting too much. ROS: No changes in bowel or bladder habits. No recent falls.  In addition, patient's oxygen supplier [employer] requests updated oxygen saturation documentation in order to continue to provide supplemental O2 for patient's chronic respiratory failure secondary to COPD and obesity hypoventilation syndrome. It appears her last visit with her pulmonologist Dr. Halford Chessman was 01/2017. She continues to use bipap at night and use 3L O2 24/7. The last documented SpO2 off of oxygen was 81% at rest 02/14/2007.   Allergies  Allergen Reactions  . Xarelto [Rivaroxaban] Other (See Comments)    Internal bleeding  . Ancef [Cefazolin] Nausea And Vomiting  . Levaquin [Levofloxacin In D5w] Other (See Comments)    "afib"  . Lyrica [Pregabalin] Hives  . Tamiflu [Oseltamivir Phosphate] Other (See Comments)    "water blisters"  . Zoloft [Sertraline Hcl] Other (See Comments)    Jaw problems, jittery  . Augmentin [Amoxicillin-Pot Clavulanate] Itching  . Ciprofloxacin Itching and Nausea And Vomiting  . Haldol [Haloperidol] Other (See Comments)    Restless leg  . Nsaids Diarrhea  .  Penicillins Itching, Nausea And Vomiting and Rash    Has patient had a PCN reaction causing immediate rash, facial/tongue/throat swelling, SOB or lightheadedness with hypotension: Yes Has patient had a PCN reaction causing severe rash involving mucus membranes or skin necrosis: No Has patient had a PCN reaction that required hospitalization No Has patient had a PCN reaction occurring within the last 10 years: Yes If all of the above answers are "NO", then may proceed with Cephalosporin use.  . Topamax [Topiramate] Nausea Only    Social History   Tobacco Use  . Smoking  status: Former Smoker    Packs/day: 2.00    Years: 14.00    Pack years: 28.00    Types: Cigarettes    Last attempt to quit: 04/12/1989    Years since quitting: 28.5  . Smokeless tobacco: Never Used  Substance Use Topics  . Alcohol use: No    Alcohol/week: 0.0 oz    Objective: Blood pressure 105/60, pulse 60, temperature 98.7 F (37.1 C), temperature source Oral, height 4\' 11"  (1.499 m), weight 181 lb (82.1 kg), SpO2 99 % on 3L portable O2. Resting O2 sat on room air 94%. Desaturated to 88% with ambulation without O2. Body mass index is 36.56 kg/m. Constitutional: Appears older than stated age.  HENT: Edentulous.  Musculoskeletal: Intermittently TTP over R greater trochanter. Able to bear weight and walk. Legs appear symmetric. No midline spinal TTP. Neurological: AOx3, no focal deficits. Skin: Skin is warm and dry. No bruising.  Psychiatric: Agitated and angry throughout visit, raising her voice in frustration. Tearful when her discomfort is acknowledged.  Vitals reviewed  Assessment/Plan: Right leg pain - Patient able to bear weight and recent negative imaging for fracture. Patient open to physical therapy. Acute stress of husband's hospitalization seems to be contributing to patient's pain. - Will fax PT request and rolling walker order to [facility] in [place]. Will also request [place] aide per patient's request given limited mobility, though likely will not be covered as patient has Medicare only.  - Recommended patient make an appointment with her psychiatrist as soon as possible to discuss current stress and strategies for coping.   Chronic hypoxic, hypercapnic respiratory failure - Patient desaturated to 88% on RA with ambulation. - Considering weaning down on O2 as able under Pulmonology's supervision.  - Will fax this office note to patient's oxygen supplier.  Mood disorder in conditions classified elsewhere - Patient having difficulty coping with current stress of husband  recovering from hospitalization and has increased somatic complaints. No active SI/HI but expresses hopelessness for the future and is very irritable during visit. - Offered counseling support but patient declines, saying it won't help. - Recommended follow-up with psychiatrist as soon as possible given some concern for hypomania given degree of agitation. Patient and her husband expressed understanding. - Provided number for suicide hotline and counseled patient and her husband that they should contact 911 if patient has suicidal thoughts.   Follow-up prn.  [name], MD [place], PGY-3

## 2017-10-08 NOTE — Discharge Instructions (Addendum)
Follow-up with your doctor next week for recheck 

## 2017-10-10 IMAGING — CT CT RENAL STONE PROTOCOL
2 of 4 series · 16 of 46 positions shown, 18 images · non-contrast
Comparison: Chest CT 03/12/2014, right upper quadrant ultrasound
09/06/2016 and HIDA scan 09/07/2016.

CLINICAL DATA: Right upper quadrant pain. HIDA scan negative. Rare
bacteria on urinalysis.

EXAM:
CT ABDOMEN AND PELVIS WITHOUT CONTRAST
TECHNIQUE: Multidetector CT imaging of the abdomen and pelvis was performed
following the standard protocol without IV contrast.

[Series 3: renal stone 5.0 · axial · 0.85mm/px · z∈[+841,+1211]mm · 13 of 82 slices shown, 15 images]
[im 4/82  soft-tissue]
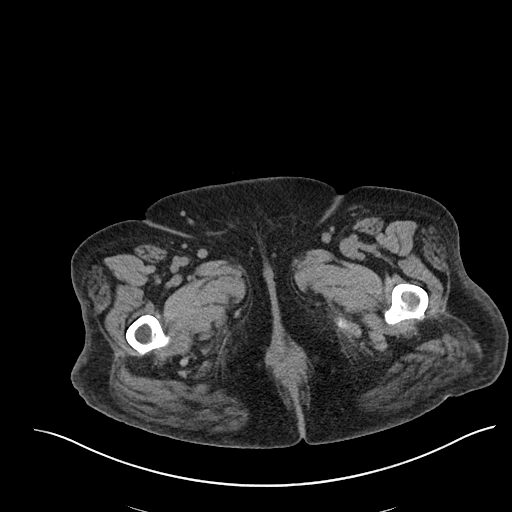
[im 4/82  bone]
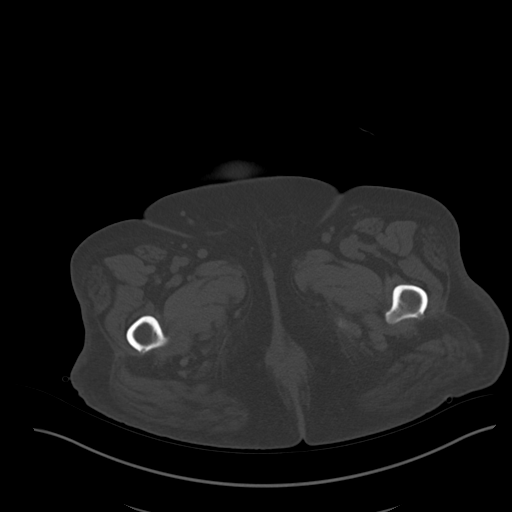
[im 10/82  soft-tissue]
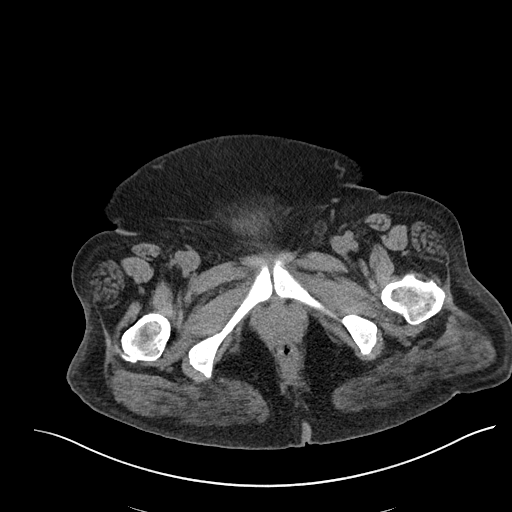
[im 16/82  soft-tissue]
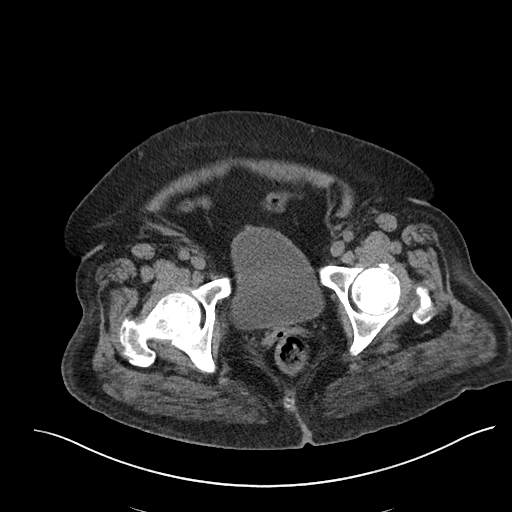
[im 22/82  soft-tissue]
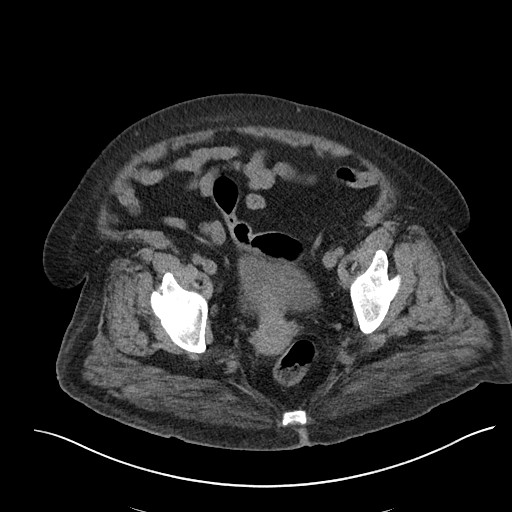
[im 29/82  soft-tissue]
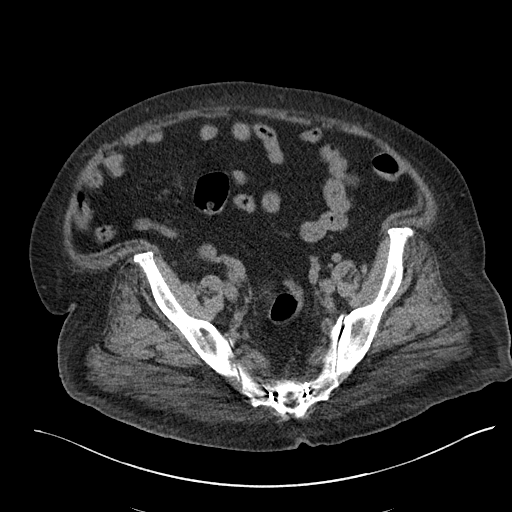
[im 35/82  soft-tissue]
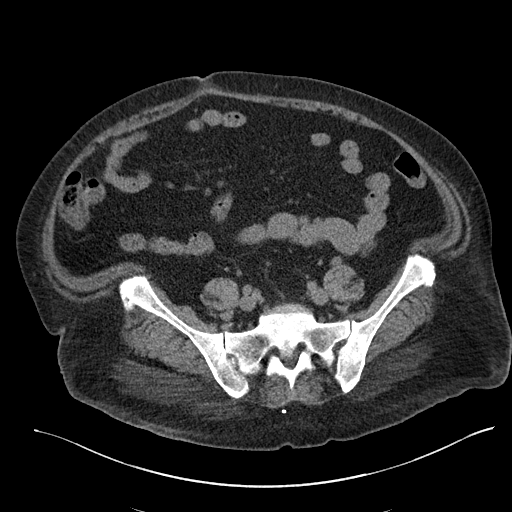
[im 41/82  soft-tissue]
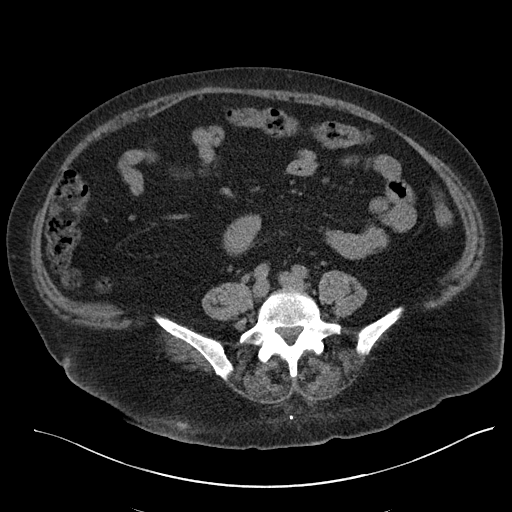
[im 47/82  soft-tissue]
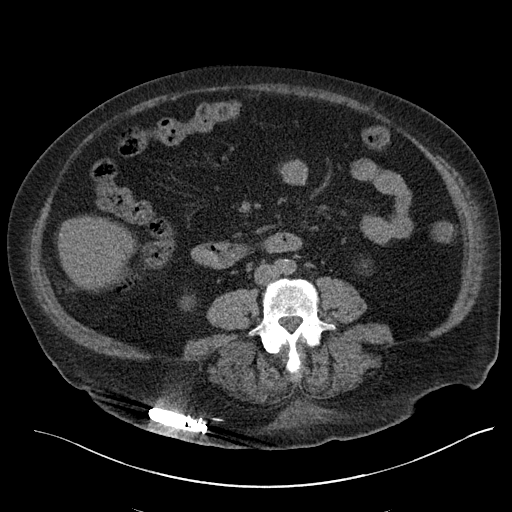
[im 53/82  soft-tissue]
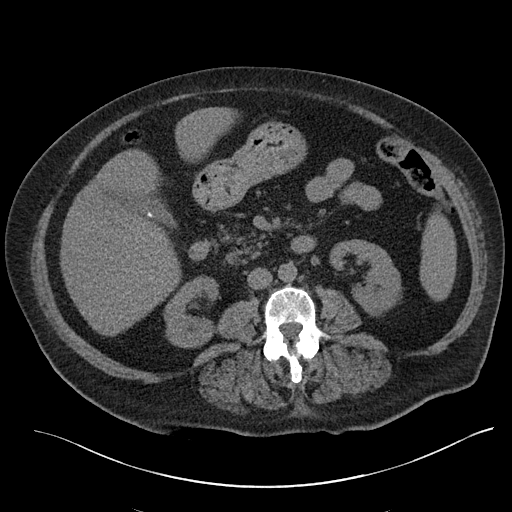
[im 53/82  bone]
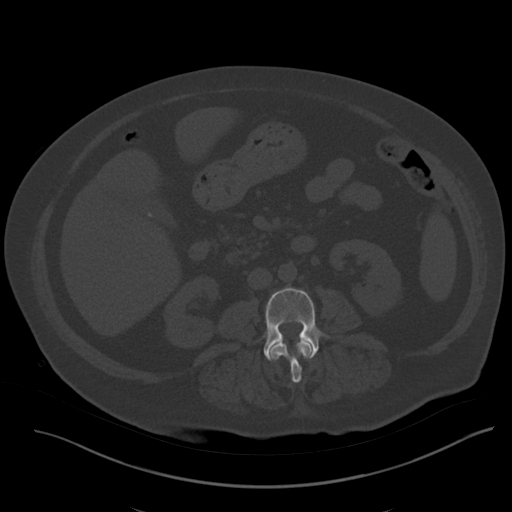
[im 60/82  soft-tissue]
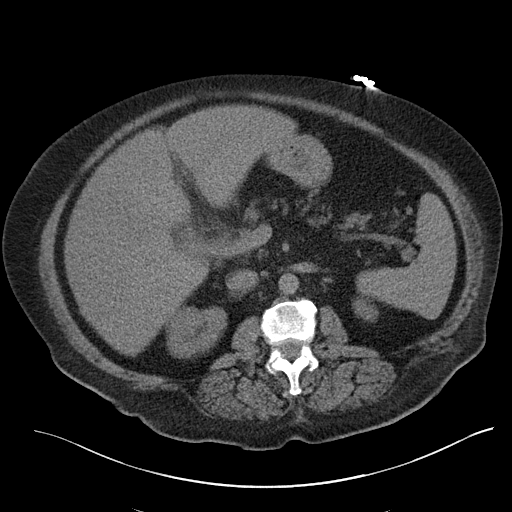
[im 66/82  soft-tissue]
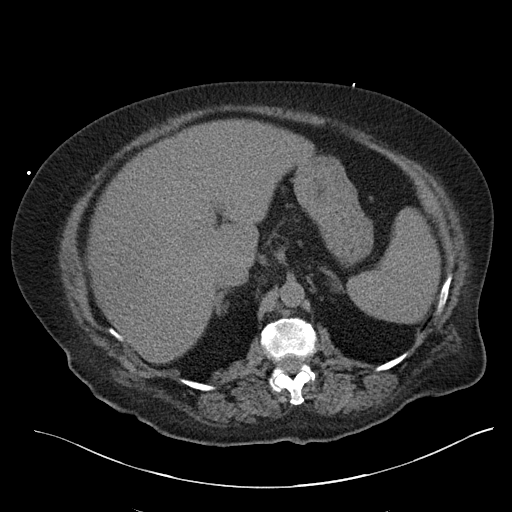
[im 72/82  soft-tissue]
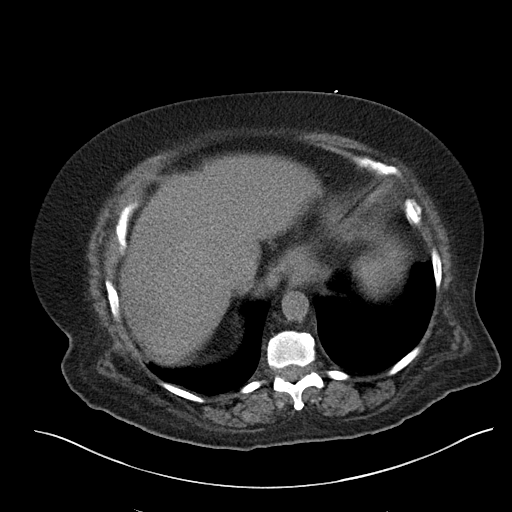
[im 78/82  soft-tissue]
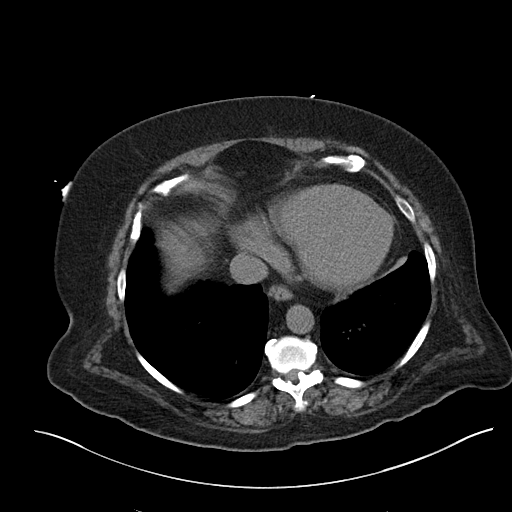

[Series 5: renal stone 3.0 cor · coronal · 0.80mm/px · 3 of 104 slices shown]
[im 35/104  soft-tissue]
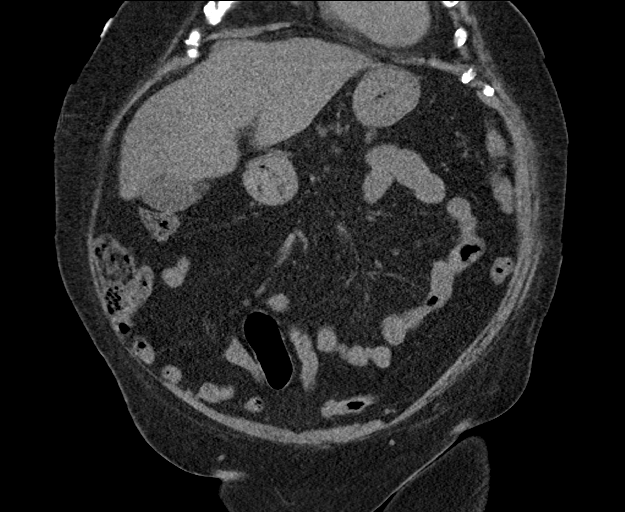
[im 46/104  soft-tissue]
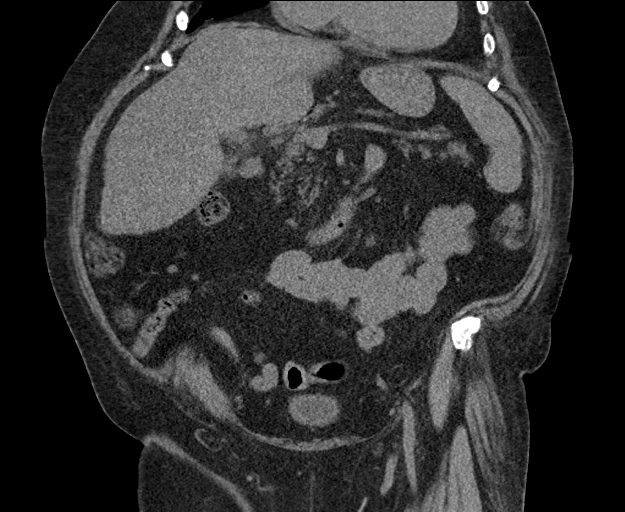
[im 58/104  soft-tissue]
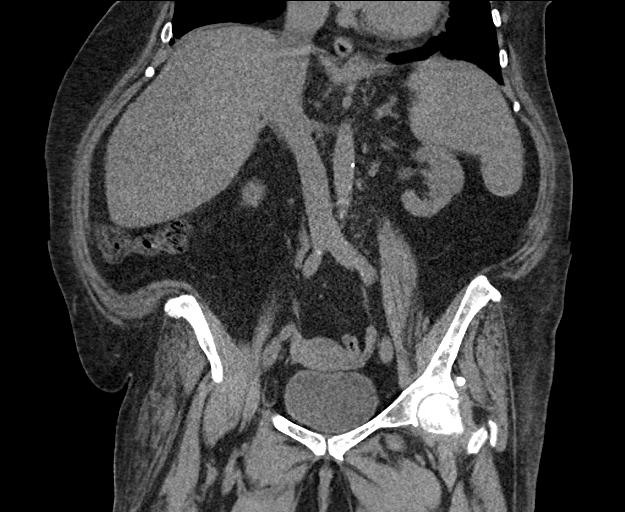

[16 of 46 positions shown; findings below may reference images not displayed]

FINDINGS: Lower chest: Lung bases are within normal.  Mild cardiomegaly.

Hepatobiliary: Liver and biliary tree are within normal. There is
mild cholelithiasis present. Subtle nonspecific hazy attenuation
within the fat adjacent over the porta hepatis.

Pancreas: Normal.

Spleen: Normal.

Adrenals/Urinary Tract: Adrenal glands are normal. Kidneys are
normal in size without hydronephrosis or nephrolithiasis. Ureters
and bladder are normal.

Stomach/Bowel: Stomach and small bowel are normal. Prior
appendectomy. Colon is within normal.

Vascular/Lymphatic: Minimal calcified plaque over the abdominal
aorta and iliac arteries. Remaining vascular structures are within
normal. No adenopathy.

Reproductive: Within normal.

Other: No free fluid.  No free peritoneal air.

Musculoskeletal: Mild degenerate change of the spine and hips.
IMPRESSION: Cholelithiasis. Subtle nonspecific hazy attenuation of the fat
adjacent the porta hepatis which may be seen with cholecystitis or
pancreatitis.

Minimal aortic atherosclerosis.

## 2017-10-10 NOTE — Patient Instructions (Signed)
Pt notified to call with any questions/concerns. 

## 2017-10-10 NOTE — Progress Notes (Signed)
Pt brings in herFreestyle Libretoday.Shehas the 14day reader and sensors. Went over instructions on how to use the [place] system. Also went over instruction on how to properly apply the sensor to herarm. Libre sensor was placed on the inside of pts upperleftarm.   Shevoices understanding on how to use the system and how to get BG reading for insulin injections and as needed. Shewas advised to contact the office if he has any questions or concerns.

## 2017-10-11 DIAGNOSIS — I130 Hypertensive heart and chronic kidney disease with heart failure and stage 1 through stage 4 chronic kidney disease, or unspecified chronic kidney disease: Secondary | ICD-10-CM | POA: Diagnosis not present

## 2017-10-11 DIAGNOSIS — Z87891 Personal history of nicotine dependence: Secondary | ICD-10-CM | POA: Diagnosis not present

## 2017-10-11 DIAGNOSIS — H9190 Unspecified hearing loss, unspecified ear: Secondary | ICD-10-CM | POA: Diagnosis not present

## 2017-10-11 DIAGNOSIS — Z794 Long term (current) use of insulin: Secondary | ICD-10-CM | POA: Diagnosis not present

## 2017-10-11 DIAGNOSIS — J449 Chronic obstructive pulmonary disease, unspecified: Secondary | ICD-10-CM | POA: Diagnosis not present

## 2017-10-11 DIAGNOSIS — Z79891 Long term (current) use of opiate analgesic: Secondary | ICD-10-CM | POA: Diagnosis not present

## 2017-10-11 DIAGNOSIS — N183 Chronic kidney disease, stage 3 (moderate): Secondary | ICD-10-CM | POA: Diagnosis not present

## 2017-10-11 DIAGNOSIS — G4733 Obstructive sleep apnea (adult) (pediatric): Secondary | ICD-10-CM | POA: Diagnosis not present

## 2017-10-11 DIAGNOSIS — H547 Unspecified visual loss: Secondary | ICD-10-CM | POA: Diagnosis not present

## 2017-10-11 DIAGNOSIS — Z9981 Dependence on supplemental oxygen: Secondary | ICD-10-CM | POA: Diagnosis not present

## 2017-10-11 DIAGNOSIS — I5042 Chronic combined systolic (congestive) and diastolic (congestive) heart failure: Secondary | ICD-10-CM | POA: Diagnosis not present

## 2017-10-11 DIAGNOSIS — E1151 Type 2 diabetes mellitus with diabetic peripheral angiopathy without gangrene: Secondary | ICD-10-CM | POA: Diagnosis not present

## 2017-10-11 DIAGNOSIS — F319 Bipolar disorder, unspecified: Secondary | ICD-10-CM | POA: Diagnosis not present

## 2017-10-11 DIAGNOSIS — M797 Fibromyalgia: Secondary | ICD-10-CM | POA: Diagnosis not present

## 2017-10-11 DIAGNOSIS — E1142 Type 2 diabetes mellitus with diabetic polyneuropathy: Secondary | ICD-10-CM | POA: Diagnosis not present

## 2017-10-11 DIAGNOSIS — Z7901 Long term (current) use of anticoagulants: Secondary | ICD-10-CM | POA: Diagnosis not present

## 2017-10-11 DIAGNOSIS — M1991 Primary osteoarthritis, unspecified site: Secondary | ICD-10-CM | POA: Diagnosis not present

## 2017-10-11 DIAGNOSIS — I428 Other cardiomyopathies: Secondary | ICD-10-CM | POA: Diagnosis not present

## 2017-10-11 DIAGNOSIS — G894 Chronic pain syndrome: Secondary | ICD-10-CM | POA: Diagnosis not present

## 2017-10-11 DIAGNOSIS — E1122 Type 2 diabetes mellitus with diabetic chronic kidney disease: Secondary | ICD-10-CM | POA: Diagnosis not present

## 2017-10-11 DIAGNOSIS — K219 Gastro-esophageal reflux disease without esophagitis: Secondary | ICD-10-CM | POA: Diagnosis not present

## 2017-10-13 ENCOUNTER — Telehealth: Payer: Self-pay

## 2017-10-13 ENCOUNTER — Telehealth: Payer: Self-pay | Admitting: Internal Medicine

## 2017-10-13 DIAGNOSIS — M5441 Lumbago with sciatica, right side: Principal | ICD-10-CM

## 2017-10-13 DIAGNOSIS — I5042 Chronic combined systolic (congestive) and diastolic (congestive) heart failure: Secondary | ICD-10-CM | POA: Diagnosis not present

## 2017-10-13 DIAGNOSIS — G8929 Other chronic pain: Secondary | ICD-10-CM

## 2017-10-13 DIAGNOSIS — E1122 Type 2 diabetes mellitus with diabetic chronic kidney disease: Secondary | ICD-10-CM | POA: Diagnosis not present

## 2017-10-13 DIAGNOSIS — G894 Chronic pain syndrome: Secondary | ICD-10-CM | POA: Diagnosis not present

## 2017-10-13 DIAGNOSIS — I130 Hypertensive heart and chronic kidney disease with heart failure and stage 1 through stage 4 chronic kidney disease, or unspecified chronic kidney disease: Secondary | ICD-10-CM | POA: Diagnosis not present

## 2017-10-13 DIAGNOSIS — J449 Chronic obstructive pulmonary disease, unspecified: Secondary | ICD-10-CM | POA: Diagnosis not present

## 2017-10-13 DIAGNOSIS — E1142 Type 2 diabetes mellitus with diabetic polyneuropathy: Secondary | ICD-10-CM | POA: Diagnosis not present

## 2017-10-13 NOTE — Telephone Encounter (Signed)
Pt would like [facility] to call her and discuss a pain clinic that she can go to in [place]. Please call pt to discuss.

## 2017-10-13 NOTE — Telephone Encounter (Signed)
Confirmed orders with Amy from [facility].

## 2017-10-13 NOTE — Telephone Encounter (Signed)
Amy, PT with [facility], called for following verbal orders:  PT 1 x/week x 1 week, then 2x/ week x 3 weeks For strengthening, pain managemant, gait, and transfers.  Call back [phone]  Danley Danker, RN [facility] [place])

## 2017-10-13 NOTE — Telephone Encounter (Signed)
Patient requests referral to new pain clinic. She has tramadol from recent ED visit. Recommended she see me in the interim if wait time is long. She also requests pain referral for her husband because of reported bad interaction she had with provider.   [name], MD [place], PGY-3

## 2017-10-14 ENCOUNTER — Telehealth: Payer: Self-pay | Admitting: Internal Medicine

## 2017-10-14 DIAGNOSIS — J449 Chronic obstructive pulmonary disease, unspecified: Secondary | ICD-10-CM | POA: Diagnosis not present

## 2017-10-14 DIAGNOSIS — G894 Chronic pain syndrome: Secondary | ICD-10-CM | POA: Diagnosis not present

## 2017-10-14 DIAGNOSIS — E1142 Type 2 diabetes mellitus with diabetic polyneuropathy: Secondary | ICD-10-CM | POA: Diagnosis not present

## 2017-10-14 DIAGNOSIS — I5042 Chronic combined systolic (congestive) and diastolic (congestive) heart failure: Secondary | ICD-10-CM | POA: Diagnosis not present

## 2017-10-14 DIAGNOSIS — I130 Hypertensive heart and chronic kidney disease with heart failure and stage 1 through stage 4 chronic kidney disease, or unspecified chronic kidney disease: Secondary | ICD-10-CM | POA: Diagnosis not present

## 2017-10-14 DIAGNOSIS — E1122 Type 2 diabetes mellitus with diabetic chronic kidney disease: Secondary | ICD-10-CM | POA: Diagnosis not present

## 2017-10-14 NOTE — Telephone Encounter (Signed)
Please ask patient to make an appointment with me at her earliest convenience.

## 2017-10-14 NOTE — Telephone Encounter (Signed)
Pt is calling because the Tramadol that she has been taking doe not do anything. It will be a few weeks before she can be seen at a pain clinic. Please let her know what the outcome is. Blima Rich

## 2017-10-15 ENCOUNTER — Other Ambulatory Visit: Payer: Self-pay | Admitting: Family Medicine

## 2017-10-15 ENCOUNTER — Telehealth: Payer: Self-pay | Admitting: Family Medicine

## 2017-10-15 NOTE — Telephone Encounter (Signed)
**  After Hours/ Emergency Line Call*  Received a call from Mrs. Crotwell about her pain. She received a call from PCP (Dr. Ola Spurr) yesterday  But was not able to talk to her. She mention a referral to pain medicine that PCP had placed for her but she had not heard from them. Patient was having foot pain and had taken 3 tramadol as instructed by PCP (per patient) but had no relief in her symptoms. After reviewing chart, I noted PCP asked patient to schedule appointment to discuss pain management in clinic. Discuss with patient, and made her an appointment to see PCP tomorrow (5/16). Did not prescribe any pain medication.  Marjie Skiff, MD [place], PGY-2

## 2017-10-15 NOTE — Telephone Encounter (Signed)
Called patient and left message to schedule and appointment.Ozella Almond, CMA

## 2017-10-16 ENCOUNTER — Emergency Department (HOSPITAL_COMMUNITY): Payer: Medicare Other

## 2017-10-16 ENCOUNTER — Encounter (HOSPITAL_COMMUNITY): Payer: Self-pay | Admitting: *Deleted

## 2017-10-16 ENCOUNTER — Ambulatory Visit: Payer: Medicare Other | Admitting: Internal Medicine

## 2017-10-16 ENCOUNTER — Emergency Department (HOSPITAL_COMMUNITY)
Admission: EM | Admit: 2017-10-16 | Discharge: 2017-10-16 | Disposition: A | Discharge status: Home / Self Care | Payer: Medicare Other | Attending: Emergency Medicine | Admitting: Emergency Medicine

## 2017-10-16 ENCOUNTER — Telehealth: Payer: Self-pay | Admitting: Internal Medicine

## 2017-10-16 ENCOUNTER — Ambulatory Visit: Payer: Self-pay

## 2017-10-16 ENCOUNTER — Other Ambulatory Visit: Payer: Self-pay

## 2017-10-16 DIAGNOSIS — Z87891 Personal history of nicotine dependence: Secondary | ICD-10-CM | POA: Insufficient documentation

## 2017-10-16 DIAGNOSIS — I130 Hypertensive heart and chronic kidney disease with heart failure and stage 1 through stage 4 chronic kidney disease, or unspecified chronic kidney disease: Secondary | ICD-10-CM | POA: Insufficient documentation

## 2017-10-16 DIAGNOSIS — I5042 Chronic combined systolic (congestive) and diastolic (congestive) heart failure: Secondary | ICD-10-CM | POA: Insufficient documentation

## 2017-10-16 DIAGNOSIS — G894 Chronic pain syndrome: Secondary | ICD-10-CM | POA: Diagnosis not present

## 2017-10-16 DIAGNOSIS — Z794 Long term (current) use of insulin: Secondary | ICD-10-CM | POA: Diagnosis not present

## 2017-10-16 DIAGNOSIS — E1122 Type 2 diabetes mellitus with diabetic chronic kidney disease: Secondary | ICD-10-CM | POA: Diagnosis not present

## 2017-10-16 DIAGNOSIS — G8929 Other chronic pain: Secondary | ICD-10-CM

## 2017-10-16 DIAGNOSIS — Z7901 Long term (current) use of anticoagulants: Secondary | ICD-10-CM | POA: Diagnosis not present

## 2017-10-16 DIAGNOSIS — S99921A Unspecified injury of right foot, initial encounter: Secondary | ICD-10-CM | POA: Diagnosis not present

## 2017-10-16 DIAGNOSIS — Z79899 Other long term (current) drug therapy: Secondary | ICD-10-CM | POA: Diagnosis not present

## 2017-10-16 DIAGNOSIS — M79671 Pain in right foot: Secondary | ICD-10-CM | POA: Insufficient documentation

## 2017-10-16 DIAGNOSIS — J449 Chronic obstructive pulmonary disease, unspecified: Secondary | ICD-10-CM | POA: Diagnosis not present

## 2017-10-16 DIAGNOSIS — E1142 Type 2 diabetes mellitus with diabetic polyneuropathy: Secondary | ICD-10-CM | POA: Diagnosis not present

## 2017-10-16 DIAGNOSIS — N183 Chronic kidney disease, stage 3 (moderate): Secondary | ICD-10-CM | POA: Diagnosis not present

## 2017-10-16 MED ORDER — OXYCODONE-ACETAMINOPHEN 5-325 MG PO TABS: 1.0000 | ORAL_TABLET | Freq: Two times a day (BID) | ORAL | 0 refills | Status: DC | PRN

## 2017-10-16 MED ORDER — OXYCODONE-ACETAMINOPHEN 5-325 MG PO TABS
1.0000 | ORAL_TABLET | Freq: Once | ORAL | Status: AC
  Administered 2017-10-16: 1 via ORAL
  Filled 2017-10-16: qty 1

## 2017-10-16 NOTE — ED Notes (Signed)
Transported to radiology per rad tech

## 2017-10-16 NOTE — ED Provider Notes (Signed)
[facility] EMERGENCY DEPARTMENT Provider Note   CSN: [id number] Arrival date & time: 10/16/17  0210     History   Chief Complaint Chief Complaint  Patient presents with  . Foot Pain    HPI Denise Freeman is a 67 y.o. female.  HPI 67 year old female presents the emergency department ongoing pain in her right foot for the past 44 days after she initially closed a car door onto her self.  She has had ongoing pain in her right leg since the event.  She denies numbness or tingling.  No weakness of her right lower extremity.  She states it hurts to ambulate and the majority the pain is located in her right foot.  She was seen in outside hospital and had an x-ray of her right foot, right tib-fib, right femur, right hip which demonstrated no acute pathology.  She states she has had ongoing pain since then.  She previously was managed for some chronic pain with oxycodone but reports that she lost her relationship with her prescriber and has had no pain medicine at home.  She feels as though this may be exacerbating her symptoms.  No fevers or chills.  No erythema.  No other complaints.   Past Medical History:  Diagnosis Date  . Allergy   . Anemia   . Anxiety   . Asthma   . Atrial fibrillation ([place])   . Bipolar 1 disorder ([place])   . 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 Cholelithiasis    a. 09/2016 s/p Lap Chole.  . Chronic bronchitis ([place])   . Chronic combined systolic and diastolic congestive heart failure ([place])    a. 09/2016 Echo: EF 30-35%.  . Colon polyps   . COPD (chronic obstructive pulmonary disease) ([place])   . Depression   . Family history of adverse reaction to anesthesia    Uncle was positive for malignant hyperthermia; patient had testing done and was negative.  . Fibromyalgia   . GERD (gastroesophageal reflux disease)   . Gout   . High cholesterol   . History of blood transfusion 06/2015   "bleeding from my rectum"  . History of hiatal  hernia   . HOH (hard of hearing)   . Hx of colonic polyps 03/21/2016   3 small adenomas no recall - co-morbidities  . Neuropathy    Disc Back   . NICM (nonischemic cardiomyopathy) ([place])    a. Previously worked up in [place], MD-->low EF with subsequent recovery.  Multiple caths (last ~ 2014 per pt report)--reportedly nl cors;  b. 09/2016 Echo: EF 30-35%, antsept/apical HK, mild MR, mildly dil LA, mod dil RA;  c. 09/2016 Lexi MV: EF 26%, glob HK, sept DK, med size, mod intensity fixed septal defect - BBB/PVC related artifact, no ischemia.  . On home oxygen therapy    "3L; 24/7" (03/14/2016)  . OSA treated with BiPAP    uses biPAP, 10 (03/14/2016)  . Osteoarthritis   . Oxygen deficiency   . Pneumonia   . Type II diabetes mellitus [facility])     Patient Active Problem List   Diagnosis Date Noted  . Right leg pain 10/08/2017  . Medically complex patient 10/03/2017  . Chronic pain syndrome 10/03/2017  . Unstable gait 10/03/2017  . Diarrhea 07/29/2017  . Type 2 diabetes mellitus with stage 3 chronic kidney disease, with long-term current use of insulin ([place]) 04/01/2017  . Essential hypertension, benign 04/01/2017  . Mood disorder in conditions  classified elsewhere 03/31/2017  . Chronic diastolic congestive heart failure ([place]) 03/12/2017  . Chronic systolic congestive heart failure ([place]) 11/05/2016  . Pressure injury of skin 09/09/2016  . NICM (nonischemic cardiomyopathy) ([place])   . Class 2 severe obesity due to excess calories with serious comorbidity and body mass index (BMI) of 38.0 to 38.9 in adult [facility])   . Acute systolic congestive heart failure ([place])   . S/P laparoscopic cholecystectomy   . Heart failure ([place]) 09/06/2016  . Dyslipidemia 08/07/2016  . Vision loss 04/19/2016  . Hx of colonic polyps 03/21/2016  . Blood in stool   . Benign neoplasm of cecum   . Benign neoplasm of rectum   . CKD (chronic kidney disease), stage III ([place]) 03/14/2016  . Mixed hyperlipidemia 03/14/2016    . Scalp lesion 03/14/2016  . Peripheral polyneuropathy 01/26/2016  . Vitamin D deficiency 09/26/2015  . DM type 2 causing vascular disease ([place]) 08/28/2015  . Chronic upper GI bleeding 06/24/2015  . COPD with asthma ([place]) 02/22/2015  . OSA (obstructive sleep apnea) 08/22/2014  . Other emphysema ([place]) 05/29/2014  . Chronic hypoxic, hypercapnic respiratory failure 04/21/2014  . Microcytic anemia 04/21/2014  . Fibromyalgia 04/14/2014  . Uncontrolled secondary diabetes mellitus with stage 3 CKD (GFR 30-59) (H[facility]) 04/14/2014  . Dyspnea on exertion 04/14/2014  . Acute on chronic combined systolic and diastolic CHF (congestive heart failure) ([place]) 04/14/2014  . Atrial fibrillation [I48.91] 04/12/2014    Past Surgical History:  Procedure Laterality Date  . APPENDECTOMY     "they busted"  . bladder stimulator     pt states, "it cannot be turned off; it's in my right hip; dead battery so it's not working anymore". (03/14/2016)  . BLADDER SUSPENSION     2003, 2006 and 2010  . CATARACT EXTRACTION W/PHACO Right 11/29/2014   Procedure: CATARACT EXTRACTION PHACO AND INTRAOCULAR LENS PLACEMENT (IOC);  Surgeon: Rutherford Guys, MD;  Location: [facility];  Service: Ophthalmology;  Laterality: Right;  CDE:3.81  . CATARACT EXTRACTION W/PHACO Left 12/13/2014   Procedure: CATARACT EXTRACTION PHACO AND INTRAOCULAR LENS PLACEMENT (IOC);  Surgeon: Rutherford Guys, MD;  Location: [facility];  Service: Ophthalmology;  Laterality: Left;  CDE:6.59  . CERVICAL DISC SURGERY N/A 2009   4, 6, and 7 cervical disc replaced  . CHOLECYSTECTOMY N/A 09/13/2016   Procedure: LAPAROSCOPIC CHOLECYSTECTOMY;  Surgeon: Rolm Bookbinder, MD;  Location: [facility];  Service: General;  Laterality: N/A;  . COLONOSCOPY WITH PROPOFOL N/A 03/15/2016   Procedure: COLONOSCOPY WITH PROPOFOL;  Surgeon: Gatha Mayer, MD;  Location: [facility];  Service: Endoscopy;  Laterality: N/A;  . ESOPHAGOGASTRODUODENOSCOPY (EGD) WITH PROPOFOL N/A 03/15/2016    Procedure: ESOPHAGOGASTRODUODENOSCOPY (EGD) WITH PROPOFOL;  Surgeon: Gatha Mayer, MD;  Location: [facility];  Service: Endoscopy;  Laterality: N/A;  . HEEL SPUR SURGERY Bilateral   . HERNIA REPAIR    . I&D EXTREMITY Right 06/13/2015   Procedure: MINOR IRRIGATION AND DEBRIDEMENT EXTREMITY REMOVAL OF NAIL;  Surgeon: Daryll Brod, MD;  Location: [facility];  Service: Orthopedics;  Laterality: Right;  . TUBAL LIGATION    . UMBILICAL HERNIA REPAIR     w/mesh     OB History   None      Home Medications    Prior to Admission medications   Medication Sig Start Date End Date Taking? Authorizing Provider  albuterol (PROVENTIL) (2.5 MG/3ML) 0.083% nebulizer solution USE (1) IN NEBULIZER EVERY 6 HOURS AS NEEDED FOR SHORTNESS OF BREATH. 07/17/17   Chesley Mires,  MD  cephALEXin (KEFLEX) 500 MG capsule Take 1 capsule (500 mg total) by mouth 4 (four) times daily. 10/08/17   Milton Ferguson, MD  cetirizine (ZYRTEC) 10 MG tablet Take 1 tablet (10 mg total) by mouth at bedtime. 01/30/17   Rogue Bussing, MD  cholecalciferol (VITAMIN D) 400 units TABS tablet Take 1 tablet (400 Units total) by mouth 2 (two) times daily. 07/17/17   Rogue Bussing, MD  Continuous Blood Gluc Sensor (FREESTYLE LIBRE SENSOR SYSTEM) MISC Use one sensor every 10 days. 04/30/17   Cassandria Anger, MD  docusate sodium (COLACE) 100 MG capsule Take 100 mg by mouth daily.    [provider]  escitalopram (LEXAPRO) 10 MG tablet Take 1 tablet (10 mg total) by mouth at bedtime. 07/31/17   Norman Clay, MD  fenofibrate 54 MG tablet TAKE 1 TABLET BY MOUTH ONCE DAILY. 10/09/17   Cassandria Anger, MD  ferrous sulfate 324 (65 Fe) MG TBEC Take 1 tablet by mouth daily.    [provider]  furosemide (LASIX) 40 MG tablet Take 2 tablets (80 mg total) by mouth 2 (two) times daily. 10/08/17   Hilty, Nadean Corwin, MD  Insulin Glargine (BASAGLAR KWIKPEN) 100 UNIT/ML SOPN Inject 0.5 mLs (50 Units  total) into the skin at bedtime. 08/14/17   Cassandria Anger, MD  insulin lispro (HUMALOG KWIKPEN) 100 UNIT/ML KiwkPen Inject 0.15-0.21 mLs (15-21 Units total) into the skin 3 (three) times daily. 08/14/17   Cassandria Anger, MD  Insulin Pen Needle 32G X 4 MM MISC Use to inject insulin 5 times daily 01/27/17   Philemon Kingdom, MD  ipratropium (ATROVENT) 0.02 % nebulizer solution USE (1) IN NEBULIZER EVERY 6 HOURS AS NEEDED FOR SHORTNESS OF BREATH. 07/17/17   Chesley Mires, MD  losartan (COZAAR) 25 MG tablet Take 0.5 tablets (12.5 mg total) by mouth daily. 10/06/17 01/04/18  Pixie Casino, MD  metolazone (ZAROXOLYN) 2.5 MG tablet Take 1 tablet (2.5 mg total) by mouth daily. Take 30 minutes prior to morning LASIX 06/13/17   Hilty, Nadean Corwin, MD  metoprolol succinate (TOPROL-XL) 25 MG 24 hr tablet Take 1 tablet (25 mg total) by mouth 2 (two) times daily. 10/21/16   Hilty, Nadean Corwin, MD  mirtazapine (REMERON) 15 MG tablet Take 1 tablet (15 mg total) by mouth at bedtime. 07/31/17   Norman Clay, MD  modafinil (PROVIGIL) 200 MG tablet Take 1 tablet (200 mg total) by mouth daily. 10/10/16   Chesley Mires, MD  mometasone (NASONEX) 50 MCG/ACT nasal spray Place 1 spray into the nose daily. 01/10/17   Chesley Mires, MD  montelukast (SINGULAIR) 10 MG tablet TAKE ONE TABLET BY MOUTH AT BEDTIME. 10/08/17   Chesley Mires, MD  mupirocin cream (BACTROBAN) 2 % Apply 1 application topically 2 (two) times daily. 06/10/17   Rogue Bussing, MD  nitroGLYCERIN (NITROSTAT) 0.4 MG SL tablet Place 1 tablet (0.4 mg total) under the tongue every 5 (five) minutes as needed for chest pain. 10/31/15   Hilty, Nadean Corwin, MD  nystatin (NYSTATIN) powder Apply topically 2 (two) times daily. Apply under breasts 10/09/16   Rogue Bussing, MD  oxyCODONE-acetaminophen (PERCOCET/ROXICET) 5-325 MG tablet Take 2 tablets by mouth every 6 (six) hours as needed for severe pain. 09/19/17   Mesner, Corene Cornea, MD  OXYGEN Inhale 3 L into the  lungs continuous. 3 liters 24/7    [provider]  pantoprazole (PROTONIX) 20 MG tablet TAKE (1) TABLET BY MOUTH TWICE A DAY BEFORE  MEALS. (BREAKFAST AND SUPPER) 10/08/17   Rogue Bussing, MD  potassium chloride (K-DUR) 10 MEQ tablet Take 1 tablet (10 mEq total) by mouth daily. 10/08/17   Milton Ferguson, MD  PRESCRIPTION MEDICATION Inhale into the lungs See admin instructions. Use BIPAP every time laying down    [provider]  primidone (MYSOLINE) 50 MG tablet Take 1 tablet (50 mg total) by mouth at bedtime. 11/05/16   Rogue Bussing, MD  Probiotic Product (PROBIOTIC PO) Take 1 tablet by mouth at bedtime.    [provider]  QUEtiapine (SEROQUEL) 25 MG tablet Take 1 tablet (25 mg total) by mouth at bedtime. 07/31/17   Norman Clay, MD  traMADol (ULTRAM) 50 MG tablet Take 1 tablet (50 mg total) by mouth every 6 (six) hours as needed. 10/08/17   Milton Ferguson, MD  triamcinolone lotion (KENALOG) 0.1 % Apply 1 application topically 3 (three) times daily. For up to 14 days and as needed 01/26/16   [place]ique, Betty G, MD  TRUE [facility] BLOOD GLUCOSE TEST test strip USE TO TEST BLOOD SUGAR AS DIRECTED. 09/24/17   Cassandria Anger, MD  vitamin A 10000 UNIT capsule Take 10,000 Units by mouth every morning.    [provider]  warfarin (COUMADIN) 4 MG tablet Take 1 tablet daily except 1 1/2 tablets on Saturdays 10/01/17   Hilty, Nadean Corwin, MD    Family History Family History  Problem Relation Age of Onset  . Heart disease Mother   . COPD Mother   . Diabetes Mother   . Breast cancer Mother   . Heart disease Father   . Hyperlipidemia Father   . COPD Sister   . Heart disease Sister   . Diabetes Sister   . Heart disease Maternal Grandmother   . Cancer Maternal Grandmother        stomach  . Cancer Maternal Grandfather        lung   . Bipolar disorder Brother     Social History Social History   Tobacco Use  . Smoking status: Former Smoker     Packs/day: 2.00    Years: 14.00    Pack years: 28.00    Types: Cigarettes    Last attempt to quit: 04/12/1989    Years since quitting: 28.5  . Smokeless tobacco: Never Used  Substance Use Topics  . Alcohol use: No    Alcohol/week: 0.0 oz  . Drug use: No     Allergies   Xarelto [rivaroxaban]; Ancef [cefazolin]; Levaquin [levofloxacin in d5w]; Lyrica [pregabalin]; Tamiflu [oseltamivir phosphate]; Zoloft [sertraline hcl]; Augmentin [amoxicillin-pot clavulanate]; Ciprofloxacin; Haldol [haloperidol]; Nsaids; Penicillins; and Topamax [topiramate]   Review of Systems Review of Systems  All other systems reviewed and are negative.    Physical Exam Updated Vital Signs BP (!) 108/48   Pulse (!) 57   Temp 99 F (37.2 C) (Oral)   Resp 17   Ht 4\' 11"  (1.499 m)   Wt 82.1 kg (181 lb)   SpO2 100%   BMI 36.56 kg/m   Physical Exam  Constitutional: She is oriented to person, place, and time. She appears well-developed and well-nourished.  HENT:  Head: Normocephalic.  Eyes: EOM are normal.  Neck: Normal range of motion.  Cardiovascular: Normal rate.  Pulmonary/Chest: Effort normal.  Abdominal: She exhibits no distension.  Musculoskeletal:  Full range of motion of right ankle, right knee, right hip.  Normal PT DP pulses in her right foot.  Tender over the dorsum of her right  foot with soft compartments and no evidence of bruising or erythema or significant swelling.  No skin changes noted to the right lower extremity.  Neurological: She is alert and oriented to person, place, and time.  Psychiatric: She has a normal mood and affect.  Nursing note and vitals reviewed.    ED Treatments / Results  Labs (all labs ordered are listed, but only abnormal results are displayed) Labs Reviewed - No data to display  EKG None  Radiology Dg Foot Complete Right  Result Date: 10/16/2017 CLINICAL DATA:  Hit foot with car door several weeks ago with continued pain EXAM: RIGHT FOOT COMPLETE  - 3+ VIEW COMPARISON:  September 02, 2017 FINDINGS: Frontal, oblique, and lateral views were obtained. There is no appreciable fracture or dislocation. There is hallux valgus deformity at the first MTP joint. There is narrowing of the first MTP joint as well as to a lesser extent all other MTP joints, all PIP, and DIP joints. There is spurring in the dorsal midfoot. There are posterior and inferior calcaneal spurs. No erosive change. There is mild generalized soft tissue swelling. IMPRESSION: Multifocal arthropathy. Hallux valgus deformity first MTP joint. No fracture or dislocation. Calcaneal spurs noted. Electronically Signed   By: Lowella Grip III M.D.   On: 10/16/2017 08:09    Procedures Procedures (including critical care time)  Medications Ordered in ED Medications  oxyCODONE-acetaminophen (PERCOCET/ROXICET) 5-325 MG per tablet 1 tablet (has no administration in time range)     Initial Impression / Assessment and Plan / ED Course  I have reviewed the triage vital signs and the nursing notes.  Pertinent labs & imaging results that were available during my care of the patient were reviewed by me and considered in my medical decision making (see chart for details).     Suspect chronic pain exacerbation.  Repeat x-ray today personally reviewed by myself demonstrates no acute osseous abnormalities.  She has chronic issues in her right foot.  Previous x-rays from the prior ER visit reviewed which demonstrates arthritis of her right hip but no other acute osseous abnormalities at that time either.  She will need close follow-up with a primary care physician.  She would benefit from new referral to a pain specialist to help manage her chronic pain.  Discharged home in good condition.  Primary care follow-up.  No indication for additional testing or acute hospitalization today.  She understands to return to the emergency department for any new or worsening or concerning symptoms.  Final Clinical  Impressions(s) / ED Diagnoses   Final diagnoses:  Right foot pain  Other chronic pain    ED Discharge Orders    None       Jola Schmidt, MD 10/16/17 [phone]

## 2017-10-16 NOTE — ED Triage Notes (Signed)
The pt has chronic pain and tonight she is c/o rt foot pain  She has had this pain for 2 months after a door struck her rt lower extremity

## 2017-10-16 NOTE — Telephone Encounter (Signed)
Pt was in ED last night because she couldn't sleep. It was found she had bone spurs. She couldn't make her appt today.  She doesn't know when she can come in for an appt because her husband cant push her in the wheelchair.  Please advise.

## 2017-10-16 NOTE — ED Notes (Signed)
Assumed care of pt at this time; resting quietly on stretcher awaiting xrays

## 2017-10-16 NOTE — ED Notes (Signed)
Returned from radiology via stretcher per rad tech

## 2017-10-16 NOTE — ED Notes (Signed)
Pt. To XRAY via stretcher. 

## 2017-10-16 NOTE — Telephone Encounter (Signed)
Spoke with patient. She said her pain was too severe to make it to appointment today. She has been out of her chronic pain medication for over a week and does not want to return to last pain clinic because of bad interaction. Checked with [place] controlled substance database. Patient has been receiving oxycodone-acetaminophen 5-325 mg #60 per month for at least 2 years. Will prescribe #12 pills until patient is able to get to our clinic next Wednesday for assessment. Imaging thus far has not provided reason for severely increased pain. Referral to new pain clinic was placed earlier this week, but there will be at least a 4-6 week period of waiting before appointment offered. Told patient I cannot consider providing refill until reassessed.   [name], MD [place], PGY-3

## 2017-10-17 ENCOUNTER — Telehealth: Payer: Self-pay

## 2017-10-17 ENCOUNTER — Other Ambulatory Visit: Payer: Self-pay

## 2017-10-17 DIAGNOSIS — E1122 Type 2 diabetes mellitus with diabetic chronic kidney disease: Secondary | ICD-10-CM | POA: Diagnosis not present

## 2017-10-17 DIAGNOSIS — J449 Chronic obstructive pulmonary disease, unspecified: Secondary | ICD-10-CM | POA: Diagnosis not present

## 2017-10-17 DIAGNOSIS — G894 Chronic pain syndrome: Secondary | ICD-10-CM | POA: Diagnosis not present

## 2017-10-17 DIAGNOSIS — I130 Hypertensive heart and chronic kidney disease with heart failure and stage 1 through stage 4 chronic kidney disease, or unspecified chronic kidney disease: Secondary | ICD-10-CM | POA: Diagnosis not present

## 2017-10-17 DIAGNOSIS — E1142 Type 2 diabetes mellitus with diabetic polyneuropathy: Secondary | ICD-10-CM | POA: Diagnosis not present

## 2017-10-17 DIAGNOSIS — I5042 Chronic combined systolic (congestive) and diastolic (congestive) heart failure: Secondary | ICD-10-CM | POA: Diagnosis not present

## 2017-10-17 NOTE — Telephone Encounter (Signed)
Arbie Cookey- RN with [facility] calling regarding patient. Pt will be receiving HH through [facility] and wanting to make sure MD is okay with patient receiving [facility] services from [facility], Call back [phone] Wallace Cullens, RN

## 2017-10-17 NOTE — Patient Outreach (Signed)
[facility]) Care Management  10/17/2017  Denise Freeman [birth year] [phone]  I was unable to reach Denise Freeman when I called her home today for monthly health coaching. I left a HIPPA compliant voice message requesting a return call.   Plan: We will continue to try to make contact with Denise Freeman for ongoing health coaching. I will send a letter to her home today outlining our services and offering contact information should she be interested in ongoing health coaching. We will reach out to Denise Freeman in 10 days.    [place]  [phone]

## 2017-10-17 NOTE — Telephone Encounter (Signed)
Called and agreed with continuing services.

## 2017-10-19 DIAGNOSIS — I130 Hypertensive heart and chronic kidney disease with heart failure and stage 1 through stage 4 chronic kidney disease, or unspecified chronic kidney disease: Secondary | ICD-10-CM | POA: Diagnosis not present

## 2017-10-19 DIAGNOSIS — I5042 Chronic combined systolic (congestive) and diastolic (congestive) heart failure: Secondary | ICD-10-CM | POA: Diagnosis not present

## 2017-10-19 DIAGNOSIS — E1122 Type 2 diabetes mellitus with diabetic chronic kidney disease: Secondary | ICD-10-CM | POA: Diagnosis not present

## 2017-10-19 DIAGNOSIS — G894 Chronic pain syndrome: Secondary | ICD-10-CM | POA: Diagnosis not present

## 2017-10-19 DIAGNOSIS — J449 Chronic obstructive pulmonary disease, unspecified: Secondary | ICD-10-CM | POA: Diagnosis not present

## 2017-10-19 DIAGNOSIS — E1142 Type 2 diabetes mellitus with diabetic polyneuropathy: Secondary | ICD-10-CM | POA: Diagnosis not present

## 2017-10-20 ENCOUNTER — Ambulatory Visit: Payer: Medicare Other | Admitting: Internal Medicine

## 2017-10-20 ENCOUNTER — Other Ambulatory Visit: Payer: Self-pay | Admitting: *Deleted

## 2017-10-20 NOTE — Patient Outreach (Signed)
[place] [facility] [facility]) Care Management  10/20/2017  [facility] December 29, 1950 [id number]  Mrs. Vendela L Wheatley is a 67 year old female with multiple medical conditions including a fib, type 2 diabetes, CHF, COPD, diarrhea, dyslipidemia, OSA, CKD stage III, and vitamin D deficiency. Mrs. Pecore has been followed by our health coach and pharmacist in the past.   We have had some difficulty maintaining contact with Mrs. Timbrook but she returned a call to me today in response to a letter we sent to her home. Her primary voiced concerns are: 1) foot pain related to either a recent fall or dropping something on her feet 2) getting help at home to clean her house.   Mobility Concerns/Foot Pain - Mrs. Hollenbaugh attempted to relate a story to me about something falling on her feet recently. In addition, she states that she had a recent fall and had to call EMS to get help getting up. She says her husband is unable to help her get up if she falls. Mrs. Crossley was unable to explain exactly what was going on with her feet but she does have an appointment scheduled with her primary care provider, Dr. Ola Spurr, on Wednesday 10/22/17 @ 9:30am. She says her husband is going to take her to the appointment.   IADL Needs - Mrs. Glorioso was seen at home by a representative from [facility]. She understands that someone is going to send paperwork to her to complete and then she will be placed on a waiting list for in home assistance.   Home Care Services - Mrs. Mullenbach relates that is currently receiving home health services through [place] and feels this is beneficial to her.   Chronic Disease Management - Ms. Dollar was unwilling to talk with me about her diabetes or any other chronic disease management today. Rather, she was focused on her feet, upcoming appointment, and in home care needs. She said I could return a call to her some time next week and she "might feel like talking about it".   Plan:  I will follow up with Mrs. Vanpatten by phone next week.    [place] [facility]  [phone]

## 2017-10-21 ENCOUNTER — Telehealth: Payer: Self-pay

## 2017-10-21 DIAGNOSIS — J449 Chronic obstructive pulmonary disease, unspecified: Secondary | ICD-10-CM | POA: Diagnosis not present

## 2017-10-21 DIAGNOSIS — G894 Chronic pain syndrome: Secondary | ICD-10-CM | POA: Diagnosis not present

## 2017-10-21 DIAGNOSIS — I5042 Chronic combined systolic (congestive) and diastolic (congestive) heart failure: Secondary | ICD-10-CM | POA: Diagnosis not present

## 2017-10-21 DIAGNOSIS — E1122 Type 2 diabetes mellitus with diabetic chronic kidney disease: Secondary | ICD-10-CM | POA: Diagnosis not present

## 2017-10-21 DIAGNOSIS — E1142 Type 2 diabetes mellitus with diabetic polyneuropathy: Secondary | ICD-10-CM | POA: Diagnosis not present

## 2017-10-21 DIAGNOSIS — I130 Hypertensive heart and chronic kidney disease with heart failure and stage 1 through stage 4 chronic kidney disease, or unspecified chronic kidney disease: Secondary | ICD-10-CM | POA: Diagnosis not present

## 2017-10-21 NOTE — Telephone Encounter (Signed)
Patient daughter left message hoping to speak with PCP regarding patient pain in foot and hip. Left call back number of [phone]. No name left but number matches name of Ralene Cork on patient chart.   Danley Danker, RN [facility] RN)

## 2017-10-21 NOTE — Telephone Encounter (Signed)
Spoke with patient's daughter. She is concerned about her mother's continued pain and thinks pain is getting worse. She is also worried about how this will affect patient's bipolar disorder. She will encourage mother to make appointment tomorrow. Daughter is also interested in getting her mother and stepfather more help at home if a program is available. If pain becomes more severe, daughter will encourage patient to go to the ER.  [name], MD [place], PGY-3

## 2017-10-22 ENCOUNTER — Ambulatory Visit (INDEPENDENT_AMBULATORY_CARE_PROVIDER_SITE_OTHER): Payer: Medicare Other | Admitting: Internal Medicine

## 2017-10-22 ENCOUNTER — Telehealth: Payer: Self-pay | Admitting: Internal Medicine

## 2017-10-22 ENCOUNTER — Ambulatory Visit
Admission: RE | Admit: 2017-10-22 | Discharge: 2017-10-22 | Disposition: A | Discharge status: Home / Self Care | Payer: Medicare Other | Source: Ambulatory Visit | Attending: Family Medicine | Admitting: Family Medicine

## 2017-10-22 VITALS — BP 100/60 | HR 66 | Temp 98.5°F

## 2017-10-22 DIAGNOSIS — M79605 Pain in left leg: Secondary | ICD-10-CM

## 2017-10-22 DIAGNOSIS — M109 Gout, unspecified: Secondary | ICD-10-CM

## 2017-10-22 DIAGNOSIS — M7989 Other specified soft tissue disorders: Secondary | ICD-10-CM | POA: Diagnosis not present

## 2017-10-22 MED ORDER — PREDNISONE 20 MG PO TABS: 40.0000 mg | ORAL_TABLET | Freq: Every day | ORAL | 0 refills | Status: DC

## 2017-10-22 MED ORDER — OXYCODONE-ACETAMINOPHEN 5-325 MG PO TABS: 1.0000 | ORAL_TABLET | Freq: Two times a day (BID) | ORAL | 0 refills | Status: DC | PRN
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
## 2017-10-22 NOTE — Telephone Encounter (Signed)
Left message for patient that xray of left lower leg and left foot were negative for fracture. Will call her Friday to check on pain symptoms.  Denise Floss, MD [place], PGY-3

## 2017-10-22 NOTE — Telephone Encounter (Signed)
Called patient's pharmacy [facility]) because I had e-prescribed medication from today's visit (prednisone and oxycodone). Patient requested paper prescriptions when given AVS because she did not think [facility] would deliver today. Was calling to cancel prescription. However, pharmacist said medication had already been delivered. He spoke to delivery agent who will stop by later today to ask for either the paper prescriptions back or delivered medications. I left voicemail for patient that she won't be able to fill the pain medication today because it had already been filled and to let her know the delivery agent would need either the paper prescription or the filled medications back.  [name], MD [place], PGY-3

## 2017-10-22 NOTE — Patient Instructions (Addendum)
Denise Freeman,  Please take prednisone 40 mg daily for the next 5 days. I will check in on Friday to see how things are going.  Please try post op shoe to help with bearing weight.  Get the xrays done at your earliest convenience at [facility].  Best, Dr. Ola Spurr  Low-Purine Diet Purines are compounds that affect the level of uric acid in your body. A low-purine diet is a diet that is low in purines. Eating a low-purine diet can prevent the level of uric acid in your body from getting too high and causing gout or kidney stones or both. What do I need to know about this diet?  Choose low-purine foods. Examples of low-purine foods are listed in the next section.  Drink plenty of fluids, especially water. Fluids can help remove uric acid from your body. Try to drink 8-16 cups (1.9-3.8 L) a day.  Limit foods high in fat, especially saturated fat, as fat makes it harder for the body to get rid of uric acid. Foods high in saturated fat include pizza, cheese, ice cream, whole milk, fried foods, and gravies. Choose foods that are lower in fat and lean sources of protein. Use olive oil when cooking as it contains healthy fats that are not high in saturated fat.  Limit alcohol. Alcohol interferes with the elimination of uric acid from your body. If you are having a gout attack, avoid all alcohol.  Keep in mind that different people's bodies react differently to different foods. You will probably learn over time which foods do or do not affect you. If you discover that a food tends to cause your gout to flare up, avoid eating that food. You can more freely enjoy foods that do not cause problems. If you have any questions about a food item, talk to your dietitian or health care provider. Which foods are low, moderate, and high in purines? The following is a list of foods that are low, moderate, and high in purines. You can eat any amount of the foods that are low in purines. You may be able to  have small amounts of foods that are moderate in purines. Ask your health care provider how much of a food moderate in purines you can have. Avoid foods high in purines. Grains  Foods low in purines: Enriched white bread, pasta, rice, cake, cornbread, popcorn.  Foods moderate in purines: Whole-grain breads and cereals, [place] germ, bran, oatmeal. Uncooked oatmeal. Dry [place] bran or [place] germ.  Foods high in purines: Pancakes, [place] toast, biscuits, muffins. Vegetables  Foods low in purines: All vegetables, except those that are moderate in purines.  Foods moderate in purines: Asparagus, cauliflower, spinach, mushrooms, green peas. Fruits  All fruits are low in purines. Meats and other Protein Foods  Foods low in purines: Eggs, nuts, peanut butter.  Foods moderate in purines: 80-90% lean beef, lamb, veal, pork, poultry, fish, eggs, peanut butter, nuts. Crab, lobster, oysters, and shrimp. Cooked dried beans, peas, and lentils.  Foods high in purines: Anchovies, sardines, herring, mussels, tuna, codfish, scallops, trout, and haddock. Denise Freeman. Organ meats (such as liver or kidney). Tripe. Game meat. Goose. Sweetbreads. Dairy  All dairy foods are low in purines. Low-fat and fat-free dairy products are best because they are low in saturated fat. Beverages  Drinks low in purines: Water, carbonated beverages, tea, coffee, cocoa.  Drinks moderate in purines: Soft drinks and other drinks sweetened with high-fructose corn syrup. Juices. To find whether a food or drink  is sweetened with high-fructose corn syrup, look at the ingredients list.  Drinks high in purines: Alcoholic beverages (such as beer). Condiments  Foods low in purines: Salt, herbs, olives, pickles, relishes, vinegar.  Foods moderate in purines: Butter, margarine, oils, mayonnaise. Fats and Oils  Foods low in purines: All types, except gravies and sauces made with meat.  Foods high in purines: Gravies and sauces made with  meat. Other Foods  Foods low in purines: Sugars, sweets, gelatin. Cake. Soups made without meat.  Foods moderate in purines: Meat-based or fish-based soups, broths, or bouillons. Foods and drinks sweetened with high-fructose corn syrup.  Foods high in purines: High-fat desserts (such as ice cream, cookies, cakes, pies, doughnuts, and chocolate). Contact your dietitian for more information on foods that are not listed here. This information is not intended to replace advice given to you by your health care provider. Make sure you discuss any questions you have with your health care provider. Document Released: 09/14/2010 Document Revised: 10/26/2015 Document Reviewed: 04/26/2013 Elsevier Interactive Patient Education  2017 [employer].

## 2017-10-23 ENCOUNTER — Ambulatory Visit: Payer: Medicare Other | Admitting: "Endocrinology

## 2017-10-23 ENCOUNTER — Ambulatory Visit: Payer: Self-pay | Admitting: Nutrition

## 2017-10-23 DIAGNOSIS — G894 Chronic pain syndrome: Secondary | ICD-10-CM | POA: Diagnosis not present

## 2017-10-23 DIAGNOSIS — I130 Hypertensive heart and chronic kidney disease with heart failure and stage 1 through stage 4 chronic kidney disease, or unspecified chronic kidney disease: Secondary | ICD-10-CM | POA: Diagnosis not present

## 2017-10-23 DIAGNOSIS — E1142 Type 2 diabetes mellitus with diabetic polyneuropathy: Secondary | ICD-10-CM | POA: Diagnosis not present

## 2017-10-23 DIAGNOSIS — I5042 Chronic combined systolic (congestive) and diastolic (congestive) heart failure: Secondary | ICD-10-CM | POA: Diagnosis not present

## 2017-10-23 DIAGNOSIS — E1122 Type 2 diabetes mellitus with diabetic chronic kidney disease: Secondary | ICD-10-CM | POA: Diagnosis not present

## 2017-10-23 DIAGNOSIS — J449 Chronic obstructive pulmonary disease, unspecified: Secondary | ICD-10-CM | POA: Diagnosis not present

## 2017-10-23 LAB — URIC ACID: URIC ACID: 14.3 mg/dL — AB (ref 2.5–7.1)

## 2017-10-24 ENCOUNTER — Other Ambulatory Visit (HOSPITAL_COMMUNITY): Payer: Self-pay | Admitting: Psychiatry

## 2017-10-24 ENCOUNTER — Encounter: Payer: Self-pay | Admitting: Internal Medicine

## 2017-10-24 ENCOUNTER — Telehealth (HOSPITAL_COMMUNITY): Payer: Self-pay | Admitting: *Deleted

## 2017-10-24 ENCOUNTER — Telehealth: Payer: Self-pay | Admitting: Internal Medicine

## 2017-10-24 DIAGNOSIS — M79605 Pain in left leg: Secondary | ICD-10-CM | POA: Insufficient documentation

## 2017-10-24 MED ORDER — ESCITALOPRAM OXALATE 10 MG PO TABS: 10.0000 mg | ORAL_TABLET | Freq: Every day | ORAL | 0 refills | Status: DC

## 2017-10-24 MED ORDER — MIRTAZAPINE 15 MG PO TABS: 15.0000 mg | ORAL_TABLET | Freq: Every day | ORAL | 0 refills | Status: AC

## 2017-10-24 MED ORDER — QUETIAPINE FUMARATE 25 MG PO TABS: 25.0000 mg | ORAL_TABLET | Freq: Every day | ORAL | 0 refills | Status: DC

## 2017-10-24 NOTE — Telephone Encounter (Signed)
Called patient. Foot pain has not really improved. She is on day 2 of prednisone. Asked if she would like me to go ahead and schedule a follow-up with me next week or call, and she requested a call. May need to extend steroid course with a taper.   [name], MD [place], PGY-3

## 2017-10-24 NOTE — Assessment & Plan Note (Addendum)
-   Exam most consistent with podagra. Pain over shin most likely secondary to bruising. Will obtain xray of left foot and tib/fib to rule out metatarsal fracture or lower leg fracture given degree of pain reported and injury prior to onset of symptoms. - Ordered prednisone 40 mg daily x 5 days. Cannot tolerate NSAIDs due to GI bleed history and intolerance with diarrhea. Patient already on protonix. May need to add steroid taper if symptoms slow to resolve. Will check in on patient by phone at end of week. - Ordered uric acid level. Provided handout on foods high in purines.  - Refilled patient's chronic pain medication x 1 month until she can be seen by pain management per her request but would plan to taper her off if she is not able to be seen by pain management.  - Precepted with Dr. Andria Frames who also suggested a post-op shoe to help patient bear weight. He provided rx for this.

## 2017-10-24 NOTE — Telephone Encounter (Signed)
ordered

## 2017-10-24 NOTE — Telephone Encounter (Signed)
Dr Modesta Messing  Due to a injury patient has no wheelchair & unable to [facility] . Reschedule appointment with plan to get some type of  Mobility equipment. Next appointment is 11/17/17. Requesting refills on medication

## 2017-10-24 NOTE — Telephone Encounter (Signed)
Called to inform patient of refills sent in & she forgot to mention that she stopped the Seroquel 25 mg  because it's not helping her sleep. I called & notified RX not to fill Seroquel 25 mg

## 2017-10-24 NOTE — Progress Notes (Signed)
[name] Progress Note  Subjective:  Denise Freeman is a 67 y.o. female with multiple chronic comorbidities including T2DM with polyneuropathy, fibromyalgia, chronic knee pain, bipolar disorder, afib on coumadin, COPD on chronic O2,  DDD with history of spinal injections per patient, urinary incontinence with bladder stimulator that does not work who presents for left foot and lower leg pain. She had been having right foot, hip, and leg pain at the beginning of April and had negative xrays on 4/3 and 4/19 and 5/16. She reports right leg symptoms are almost resolved but is still having pain with lifting toes of right foot when working with PT but has no trouble bearing weight and now has normal sensation in the leg. She began having left foot and lower leg pain last Friday after dropping her oxygen tank on her foot and also a pallet of water. The pain is excruciating when she tries to bear weight. Her regular percocet 5-325 mg BID does not help the pain. Nor did tramadol given Pain has made it very difficult to get rest and cope with stress. ROS: No fevers, no falls; does report decreased appetite and has been urinating less frequently, which she attributes to not drinking as much water as usual.   Social: Patient reports [facility] has reached out to her and that they are looking into a program to help with housework given her and her husband's physical limitations but that there is usually a long wait list.   Allergies  Allergen Reactions  . Xarelto [Rivaroxaban] Other (See Comments)    Internal bleeding  . Ancef [Cefazolin] Nausea And Vomiting  . Levaquin [Levofloxacin In D5w] Other (See Comments)    "afib"  . Lyrica [Pregabalin] Hives  . Tamiflu [Oseltamivir Phosphate] Other (See Comments)    "water blisters"  . Zoloft [Sertraline Hcl] Other (See Comments)    Jaw problems, jittery  . Augmentin [Amoxicillin-Pot Clavulanate] Itching  . Ciprofloxacin Itching and Nausea And Vomiting   . Haldol [Haloperidol] Other (See Comments)    Restless leg  . Nsaids Diarrhea  . Penicillins Itching, Nausea And Vomiting and Rash    Has patient had a PCN reaction causing immediate rash, facial/tongue/throat swelling, SOB or lightheadedness with hypotension: Yes Has patient had a PCN reaction causing severe rash involving mucus membranes or skin necrosis: No Has patient had a PCN reaction that required hospitalization No Has patient had a PCN reaction occurring within the last 10 years: Yes If all of the above answers are "NO", then may proceed with Cephalosporin use.  . Topamax [Topiramate] Nausea Only    Social History   Tobacco Use  . Smoking status: Former Smoker    Packs/day: 2.00    Years: 14.00    Pack years: 28.00    Types: Cigarettes    Last attempt to quit: 04/12/1989    Years since quitting: 28.5  . Smokeless tobacco: Never Used  Substance Use Topics  . Alcohol use: No    Alcohol/week: 0.0 oz    Objective: Blood pressure 100/60, pulse 66, temperature 98.5 F (36.9 C), SpO2 97 %.  Constitutional: Appears older than stated age Musculoskeletal: Bilateral swelling of feet. Few light bruises over left anterior lower leg. TTP with light touch over dorsal surface of left foot (though variable exam; tolerated pressure when examined by Dr. Andria Frames) and with dorsi and plantar flexion when minimal resistance applied. Also TTP over left mid-tibia. Difficult to localize area of worst pain of left foot but appears  to be midfoot and great toe. Reports discomfort with dorsiflexion of right foot over toes.  Neurological: AOx3, no focal deficits. Skin: Skin is warm and dry. Erythema and increased warmth over left anterior medial foot, especially base of 1st metatarsal.  Psychiatric: Rapid speech but able to interject.   Vitals reviewed  Assessment/Plan: Pain of left lower extremity - Exam most consistent with podagra. Pain over shin most likely secondary to bruising. Will obtain  xray of left foot and tib/fib to rule out metatarsal fracture or lower leg fracture given degree of pain reported and injury prior to onset of symptoms. - Ordered prednisone 40 mg daily x 5 days. Cannot tolerate NSAIDs due to GI bleed history and intolerance with diarrhea. Patient already on protonix. May need to add steroid taper if symptoms slow to resolve. Will check in on patient by phone at end of week. - Ordered uric acid level. Provided handout on foods high in purines.  - Refilled patient's chronic pain medication x 1 month until she can be seen by pain management per her request but would plan to taper her off if she is not able to be seen by pain management.  - Precepted with Dr. Andria Frames who also suggested a post-op shoe to help patient bear weight. He provided rx for this.   Follow-up prn.  [name], MD [place], PGY-3

## 2017-10-28 ENCOUNTER — Ambulatory Visit (HOSPITAL_COMMUNITY): Payer: Self-pay | Admitting: Psychiatry

## 2017-10-28 ENCOUNTER — Ambulatory Visit: Payer: Self-pay

## 2017-10-28 ENCOUNTER — Telehealth: Payer: Self-pay | Admitting: Internal Medicine

## 2017-10-28 DIAGNOSIS — E1122 Type 2 diabetes mellitus with diabetic chronic kidney disease: Secondary | ICD-10-CM | POA: Diagnosis not present

## 2017-10-28 DIAGNOSIS — J449 Chronic obstructive pulmonary disease, unspecified: Secondary | ICD-10-CM | POA: Diagnosis not present

## 2017-10-28 DIAGNOSIS — G894 Chronic pain syndrome: Secondary | ICD-10-CM | POA: Diagnosis not present

## 2017-10-28 DIAGNOSIS — I5042 Chronic combined systolic (congestive) and diastolic (congestive) heart failure: Secondary | ICD-10-CM | POA: Diagnosis not present

## 2017-10-28 DIAGNOSIS — E1142 Type 2 diabetes mellitus with diabetic polyneuropathy: Secondary | ICD-10-CM | POA: Diagnosis not present

## 2017-10-28 DIAGNOSIS — I130 Hypertensive heart and chronic kidney disease with heart failure and stage 1 through stage 4 chronic kidney disease, or unspecified chronic kidney disease: Secondary | ICD-10-CM | POA: Diagnosis not present

## 2017-10-28 MED ORDER — PREDNISONE 5 MG (21) PO TBPK: ORAL_TABLET | ORAL | 0 refills | Status: DC

## 2017-10-28 MED ORDER — COLCHICINE 0.6 MG PO TABS: 0.6000 mg | ORAL_TABLET | Freq: Every day | ORAL | 0 refills | Status: DC

## 2017-10-28 NOTE — Telephone Encounter (Signed)
Called patient to follow-up foot pain. She says she is now able to bear some weight on left foot and has some improvement in redness but still has significant pain and is now out of steroids. Will order 5-day prednisone dose pack and colchicine 0.6 mg daily. Made follow-up appointment for 6/13.  [name], MD [place], PGY-3

## 2017-10-29 ENCOUNTER — Telehealth: Payer: Self-pay | Admitting: Internal Medicine

## 2017-10-29 ENCOUNTER — Ambulatory Visit (INDEPENDENT_AMBULATORY_CARE_PROVIDER_SITE_OTHER): Payer: Medicare Other | Admitting: *Deleted

## 2017-10-29 ENCOUNTER — Ambulatory Visit: Payer: Self-pay

## 2017-10-29 DIAGNOSIS — G894 Chronic pain syndrome: Secondary | ICD-10-CM | POA: Diagnosis not present

## 2017-10-29 DIAGNOSIS — J449 Chronic obstructive pulmonary disease, unspecified: Secondary | ICD-10-CM | POA: Diagnosis not present

## 2017-10-29 DIAGNOSIS — I5042 Chronic combined systolic (congestive) and diastolic (congestive) heart failure: Secondary | ICD-10-CM | POA: Diagnosis not present

## 2017-10-29 DIAGNOSIS — Z5181 Encounter for therapeutic drug level monitoring: Secondary | ICD-10-CM | POA: Diagnosis not present

## 2017-10-29 DIAGNOSIS — E1142 Type 2 diabetes mellitus with diabetic polyneuropathy: Secondary | ICD-10-CM | POA: Diagnosis not present

## 2017-10-29 DIAGNOSIS — I130 Hypertensive heart and chronic kidney disease with heart failure and stage 1 through stage 4 chronic kidney disease, or unspecified chronic kidney disease: Secondary | ICD-10-CM | POA: Diagnosis not present

## 2017-10-29 DIAGNOSIS — I4891 Unspecified atrial fibrillation: Secondary | ICD-10-CM

## 2017-10-29 DIAGNOSIS — E1122 Type 2 diabetes mellitus with diabetic chronic kidney disease: Secondary | ICD-10-CM | POA: Diagnosis not present

## 2017-10-29 LAB — POCT INR: INR: 2.5 (ref 2.0–3.0)

## 2017-10-29 NOTE — Telephone Encounter (Signed)
Faxed signed orders r/t PT/INR and warfarin dosing to [facility]

## 2017-10-29 NOTE — Patient Instructions (Signed)
Continue coumadin 1 tablet daily except 1 1/2 tablets on Saturdays Recheck in 1 week Started prednisone 20mg  daily on 5/22.  Will be staying on it for now.  Getting Rx today for colchicine for gout. Order given to [facility]

## 2017-10-31 DIAGNOSIS — I5042 Chronic combined systolic (congestive) and diastolic (congestive) heart failure: Secondary | ICD-10-CM | POA: Diagnosis not present

## 2017-10-31 DIAGNOSIS — E1122 Type 2 diabetes mellitus with diabetic chronic kidney disease: Secondary | ICD-10-CM | POA: Diagnosis not present

## 2017-10-31 DIAGNOSIS — G894 Chronic pain syndrome: Secondary | ICD-10-CM | POA: Diagnosis not present

## 2017-10-31 DIAGNOSIS — E1142 Type 2 diabetes mellitus with diabetic polyneuropathy: Secondary | ICD-10-CM | POA: Diagnosis not present

## 2017-10-31 DIAGNOSIS — J449 Chronic obstructive pulmonary disease, unspecified: Secondary | ICD-10-CM | POA: Diagnosis not present

## 2017-10-31 DIAGNOSIS — I130 Hypertensive heart and chronic kidney disease with heart failure and stage 1 through stage 4 chronic kidney disease, or unspecified chronic kidney disease: Secondary | ICD-10-CM | POA: Diagnosis not present

## 2017-11-03 ENCOUNTER — Telehealth: Payer: Self-pay | Admitting: Internal Medicine

## 2017-11-03 NOTE — Telephone Encounter (Signed)
Pt called and would like to know if she could have another refill for prednisone. She has enough left to last her one more day. She says she is still in pain and thinks she needs to continue taking prednisone.

## 2017-11-04 DIAGNOSIS — I5042 Chronic combined systolic (congestive) and diastolic (congestive) heart failure: Secondary | ICD-10-CM | POA: Diagnosis not present

## 2017-11-04 DIAGNOSIS — G894 Chronic pain syndrome: Secondary | ICD-10-CM | POA: Diagnosis not present

## 2017-11-04 DIAGNOSIS — E1142 Type 2 diabetes mellitus with diabetic polyneuropathy: Secondary | ICD-10-CM | POA: Diagnosis not present

## 2017-11-04 DIAGNOSIS — I130 Hypertensive heart and chronic kidney disease with heart failure and stage 1 through stage 4 chronic kidney disease, or unspecified chronic kidney disease: Secondary | ICD-10-CM | POA: Diagnosis not present

## 2017-11-04 DIAGNOSIS — J449 Chronic obstructive pulmonary disease, unspecified: Secondary | ICD-10-CM | POA: Diagnosis not present

## 2017-11-04 DIAGNOSIS — E1122 Type 2 diabetes mellitus with diabetic chronic kidney disease: Secondary | ICD-10-CM | POA: Diagnosis not present

## 2017-11-05 ENCOUNTER — Other Ambulatory Visit: Payer: Self-pay | Admitting: Internal Medicine

## 2017-11-05 DIAGNOSIS — K219 Gastro-esophageal reflux disease without esophagitis: Secondary | ICD-10-CM

## 2017-11-05 DIAGNOSIS — E1159 Type 2 diabetes mellitus with other circulatory complications: Secondary | ICD-10-CM | POA: Diagnosis not present

## 2017-11-05 MED ORDER — PREDNISONE 20 MG PO TABS: 10.0000 mg | ORAL_TABLET | Freq: Every day | ORAL | 0 refills | Status: DC

## 2017-11-05 NOTE — Telephone Encounter (Signed)
Pt called about her prednisone refill.  She is out.

## 2017-11-05 NOTE — Telephone Encounter (Signed)
Please send the prensidone Rx to her pharmacy.  She says she cannot deal with the pain. She says the steriod helps with the pain.  The pain is now in her back.

## 2017-11-05 NOTE — Telephone Encounter (Signed)
Rx sent to pharmacy   

## 2017-11-05 NOTE — Telephone Encounter (Signed)
Attempted to call pt but no answer, will try again later. Cloyde Oregel Kennon Holter, CMA

## 2017-11-05 NOTE — Telephone Encounter (Signed)
Please let patient know I will give her 4 more days of prednisone. If not improved after that she needs to return for follow up. Asiyah

## 2017-11-05 NOTE — Telephone Encounter (Signed)
Please send the prednisone Rx to her pharmacy so it can be delivered.  She says the pain is unbearable.  She says the prednisone helps with the pain.  She doesn't know how much longer she can deal with the pain. It is now in her back.

## 2017-11-06 DIAGNOSIS — I130 Hypertensive heart and chronic kidney disease with heart failure and stage 1 through stage 4 chronic kidney disease, or unspecified chronic kidney disease: Secondary | ICD-10-CM | POA: Diagnosis not present

## 2017-11-06 DIAGNOSIS — E1122 Type 2 diabetes mellitus with diabetic chronic kidney disease: Secondary | ICD-10-CM | POA: Diagnosis not present

## 2017-11-06 DIAGNOSIS — I5042 Chronic combined systolic (congestive) and diastolic (congestive) heart failure: Secondary | ICD-10-CM | POA: Diagnosis not present

## 2017-11-06 DIAGNOSIS — G894 Chronic pain syndrome: Secondary | ICD-10-CM | POA: Diagnosis not present

## 2017-11-06 DIAGNOSIS — J449 Chronic obstructive pulmonary disease, unspecified: Secondary | ICD-10-CM | POA: Diagnosis not present

## 2017-11-06 DIAGNOSIS — E1142 Type 2 diabetes mellitus with diabetic polyneuropathy: Secondary | ICD-10-CM | POA: Diagnosis not present

## 2017-11-06 LAB — COMPLETE METABOLIC PANEL WITH GFR
AG Ratio: 1.5 (calc) (ref 1.0–2.5)
ALBUMIN MSPROF: 4.4 g/dL (ref 3.6–5.1)
ALT: 8 U/L (ref 6–29)
AST: 15 U/L (ref 10–35)
Alkaline phosphatase (APISO): 58 U/L (ref 33–130)
BUN / CREAT RATIO: 45 (calc) — AB (ref 6–22)
BUN: 83 mg/dL — AB (ref 7–25)
CO2: 40 mmol/L — AB (ref 20–32)
CREATININE: 1.86 mg/dL — AB (ref 0.50–0.99)
Calcium: 9.4 mg/dL (ref 8.6–10.4)
Chloride: <80 mmol/L — ABNORMAL LOW (ref 98–110)
GFR, EST AFRICAN AMERICAN: 32 mL/min/{1.73_m2} — AB (ref >=60)
GFR, Est Non African American: 28 mL/min/{1.73_m2} — ABNORMAL LOW (ref >=60)
GLUCOSE: 205 mg/dL — AB (ref 65–99)
Globulin: 3 g/dL (calc) (ref 1.9–3.7)
Potassium: 3 mmol/L — ABNORMAL LOW (ref 3.5–5.3)
Sodium: 131 mmol/L — ABNORMAL LOW (ref 135–146)
TOTAL PROTEIN: 7.4 g/dL (ref 6.1–8.1)
Total Bilirubin: 0.6 mg/dL (ref 0.2–1.2)

## 2017-11-06 LAB — HEMOGLOBIN A1C
Hgb A1c MFr Bld: 8.6 % of total Hgb — ABNORMAL HIGH (ref <5.7)
Mean Plasma Glucose: 200 (calc)
eAG (mmol/L): 11.1 (calc)

## 2017-11-06 NOTE — Telephone Encounter (Signed)
pts daughter called.  Pt is upset because she only got 4 prednisone pills.  She is wanting enough pills to get her thru to her appt with Ola Spurr 11-13-17.  Pt says the pain is unbearable.  The prednisone helps with the pain.  Please advise

## 2017-11-07 ENCOUNTER — Ambulatory Visit (INDEPENDENT_AMBULATORY_CARE_PROVIDER_SITE_OTHER): Payer: Medicare Other | Admitting: Pulmonary Disease

## 2017-11-07 ENCOUNTER — Encounter: Payer: Self-pay | Admitting: Pulmonary Disease

## 2017-11-07 ENCOUNTER — Other Ambulatory Visit: Payer: Self-pay | Admitting: Internal Medicine

## 2017-11-07 ENCOUNTER — Ambulatory Visit (INDEPENDENT_AMBULATORY_CARE_PROVIDER_SITE_OTHER): Payer: Medicare Other | Admitting: Pharmacist

## 2017-11-07 VITALS — BP 126/78 | HR 58 | Ht 59.0 in | Wt 171.4 lb

## 2017-11-07 DIAGNOSIS — I4891 Unspecified atrial fibrillation: Secondary | ICD-10-CM | POA: Diagnosis not present

## 2017-11-07 DIAGNOSIS — E1142 Type 2 diabetes mellitus with diabetic polyneuropathy: Secondary | ICD-10-CM | POA: Diagnosis not present

## 2017-11-07 DIAGNOSIS — G4733 Obstructive sleep apnea (adult) (pediatric): Secondary | ICD-10-CM | POA: Diagnosis not present

## 2017-11-07 DIAGNOSIS — J9611 Chronic respiratory failure with hypoxia: Secondary | ICD-10-CM | POA: Diagnosis not present

## 2017-11-07 DIAGNOSIS — I130 Hypertensive heart and chronic kidney disease with heart failure and stage 1 through stage 4 chronic kidney disease, or unspecified chronic kidney disease: Secondary | ICD-10-CM | POA: Diagnosis not present

## 2017-11-07 DIAGNOSIS — E1122 Type 2 diabetes mellitus with diabetic chronic kidney disease: Secondary | ICD-10-CM | POA: Diagnosis not present

## 2017-11-07 DIAGNOSIS — R251 Tremor, unspecified: Secondary | ICD-10-CM

## 2017-11-07 DIAGNOSIS — J449 Chronic obstructive pulmonary disease, unspecified: Secondary | ICD-10-CM | POA: Diagnosis not present

## 2017-11-07 DIAGNOSIS — G894 Chronic pain syndrome: Secondary | ICD-10-CM | POA: Diagnosis not present

## 2017-11-07 DIAGNOSIS — I5042 Chronic combined systolic (congestive) and diastolic (congestive) heart failure: Secondary | ICD-10-CM | POA: Diagnosis not present

## 2017-11-07 LAB — POCT INR: INR: 2.8 (ref 2.0–3.0)

## 2017-11-07 MED ORDER — PREDNISONE 5 MG PO TABS: 5.0000 mg | ORAL_TABLET | Freq: Every day | ORAL | 2 refills | Status: DC

## 2017-11-07 NOTE — Telephone Encounter (Signed)
Daughter is Marland Mcalpine and her phone number is [phone]

## 2017-11-07 NOTE — Progress Notes (Signed)
[facility]  Chief Complaint  Patient presents with  . Follow-up    Wants refill on prednisone has been out for 2 days, reports breathing has been the same, no concerns voiced. Reports she has lost 10lbs in 7 days.     Vital signs: BP 126/78 (BP Location: Left Arm, Cuff Size: Normal)   Pulse (!) 58   Ht 4\' 11"  (1.499 m)   Wt 171 lb 6.4 oz (77.7 kg)   SpO2 99%   BMI 34.62 kg/m   History of Present Illness: Denise Freeman is a 68 y.o. female former smoker with COPD, OSA, and chronic hypoxic/hypercapnic respiratory failure with OHS.  She has terrible pains from her gout.  She normally uses prednisone.  She ran out of prednisone recently, and wasn't able to get refill.  She has occasional cough and clear sputum.  She is not having wheeze, sputum or chest pain.  Uses oxygen 24/7 and Bipap at night.  Can't sleep w/o Bipap.  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 - continue ipratropium, albuterol, singulair - she is on chronic prednisone for gout and this should help with her COPD also  Obstructive sleep apnea. - she is compliant with Bipap and reports benefit - continue Bipap 16/10 cm H2O  Daytime sleepiness with OSA. - continue nuvigil  Chronic respiratory failure 2nd to COPD and Obesity hypoventilation syndrome. - 3 liters oxygen 35/7  Chronic systolic heart failure. - f/u with cardiology  Gout. - refilled script for prednisone  Goals of Care. - DNR/DNI  Patient Instructions  Follow up in 6 months    Chesley Mires, MD [place] 11/07/2017, 3:22 PM  Flow Sheet  Sleep tests: PSG  1991 Bipap 11/01/15 to 01/29/16 >> used on 89 of 90 nights with average 10 hrs 19 min.  Average AHI 2.4 with Bipap 16/10 cm H2O  Cardiac tests Echo 09/07/16 >> EF 30 to 35%, mild MR  Pulmonary tests PFT 10/24/14 >> FEV1 0.64 (30%), FEV1% 65, TLC 2.92 (64%), DLCO 54%, + BD  Past Medical History: She  has a past medical history of Allergy, Anemia, Anxiety, Asthma, Atrial fibrillation ([place]), Bipolar 1 disorder ([place]), Blind, Cholelithiasis, Chronic bronchitis ([place]), Chronic combined systolic and diastolic congestive heart failure ([place]), Colon polyps, COPD (chronic obstructive pulmonary disease) ([place]), Depression, Family history of adverse reaction to anesthesia, Fibromyalgia, GERD (gastroesophageal reflux disease), Gout, High cholesterol, History of blood transfusion (06/2015), History of hiatal hernia, HOH (hard of hearing), colonic polyps (03/21/2016), Neuropathy, NICM (nonischemic cardiomyopathy) ([place]), On home oxygen therapy, OSA treated with BiPAP, Osteoarthritis, Oxygen deficiency, Pneumonia, and Type II diabetes mellitus ([place]).  Past Surgical History: She  has a past surgical history that includes Heel spur surgery (Bilateral); Bladder suspension; bladder stimulator; Cataract extraction w/PHACO (Right, 11/29/2014); Cataract extraction w/PHACO (Left, 12/13/2014); I&D extremity (Right, 06/13/2015); Tubal ligation; Cervical disc surgery (N/A, 2009); Umbilical hernia repair; Appendectomy; Hernia repair; Esophagogastroduodenoscopy (egd) with propofol (N/A, 03/15/2016); Colonoscopy with propofol (N/A, 03/15/2016); and Cholecystectomy (N/A, 09/13/2016).  Family History: Her family history includes Bipolar disorder in her brother; Breast cancer in her mother; COPD in her mother and sister; Cancer  in her maternal grandfather and maternal grandmother; Diabetes in her mother and sister; Heart disease in her father, maternal grandmother, mother, and sister; Hyperlipidemia in her father.  Social History: She   reports that she quit smoking about 28 years ago. Her smoking use included cigarettes. She has a 28.00 pack-year smoking history. She has never used smokeless tobacco. She reports that she does not drink alcohol or use drugs.  Medications: Allergies as of 11/07/2017      Reactions   Xarelto [rivaroxaban] Other (See Comments)   Internal bleeding   Ancef [cefazolin] Nausea And Vomiting   Levaquin [levofloxacin In D5w] Other (See Comments)   "afib"   Lyrica [pregabalin] Hives   Tamiflu [oseltamivir Phosphate] Other (See Comments)   "water blisters"   Zoloft [sertraline Hcl] Other (See Comments)   Jaw problems, jittery   Augmentin [amoxicillin-pot Clavulanate] Itching   Ciprofloxacin Itching, Nausea And Vomiting   Haldol [haloperidol] Other (See Comments)   Restless leg   Nsaids Diarrhea   Penicillins Itching, Nausea And Vomiting, Rash   Has patient had a PCN reaction causing immediate rash, facial/tongue/throat swelling, SOB or lightheadedness with hypotension: Yes Has patient had a PCN reaction causing severe rash involving mucus membranes or skin necrosis: No Has patient had a PCN reaction that required hospitalization No Has patient had a PCN reaction occurring within the last 10 years: Yes If all of the above answers are "NO", then may proceed with Cephalosporin use.   Topamax [topiramate] Nausea Only      Medication List        Accurate as of 11/07/17  3:22 PM. Always use your most recent med list.          albuterol (2.5 MG/3ML) 0.083% nebulizer solution Commonly known as:  PROVENTIL USE (1) IN NEBULIZER EVERY 6 HOURS AS NEEDED FOR SHORTNESS OF BREATH.   BASAGLAR KWIKPEN 100 UNIT/ML Sopn Inject 0.5 mLs (50 Units total) into the skin at bedtime.   cephALEXin 500 MG capsule Commonly known as:  KEFLEX Take 1 capsule (500 mg total) by mouth 4 (four) times daily.   cetirizine 10 MG tablet Commonly known as:  ZYRTEC Take 1 tablet (10 mg total) by mouth at bedtime.    cholecalciferol 400 units Tabs tablet Commonly known as:  VITAMIN D Take 1 tablet (400 Units total) by mouth 2 (two) times daily.   colchicine 0.6 MG tablet Take 1 tablet (0.6 mg total) by mouth daily. If have stomach upset, can reduce to every other day.   docusate sodium 100 MG capsule Commonly known as:  COLACE Take 100 mg by mouth daily.   escitalopram 10 MG tablet Commonly known as:  LEXAPRO Take 1 tablet (10 mg total) by mouth at bedtime.   fenofibrate 54 MG tablet TAKE 1 TABLET BY MOUTH ONCE DAILY.   ferrous sulfate 324 (65 Fe) MG Tbec Take 1 tablet by mouth daily.   FREESTYLE LIBRE SENSOR SYSTEM Misc Use one sensor every 10 days.   furosemide 40 MG tablet Commonly known as:  LASIX Take 2 tablets (80 mg total) by mouth 2 (two) times daily.   insulin lispro 100 UNIT/ML KiwkPen Commonly known as:  HUMALOG KWIKPEN Inject 0.15-0.21 mLs (15-21 Units total) into the skin 3 (three) times daily.   Insulin Pen Needle 32G X 4 MM Misc Use to inject insulin 5 times daily   ipratropium 0.02 % nebulizer solution Commonly known as:  ATROVENT USE (1) IN NEBULIZER EVERY 6 HOURS AS NEEDED FOR SHORTNESS  OF BREATH.   losartan 25 MG tablet Commonly known as:  COZAAR Take 0.5 tablets (12.5 mg total) by mouth daily.   metolazone 2.5 MG tablet Commonly known as:  ZAROXOLYN Take 1 tablet (2.5 mg total) by mouth daily. Take 30 minutes prior to morning LASIX   metoprolol succinate 25 MG 24 hr tablet Commonly known as:  TOPROL-XL Take 1 tablet (25 mg total) by mouth 2 (two) times daily. Please call to make appointment for further refills.   mirtazapine 15 MG tablet Commonly known as:  REMERON Take 1 tablet (15 mg total) by mouth at bedtime.   modafinil 200 MG tablet Commonly known as:  PROVIGIL Take 1 tablet (200 mg total) by mouth daily.   mometasone 50 MCG/ACT nasal spray Commonly known as:  NASONEX Place 1 spray into the nose daily.   montelukast 10 MG tablet Commonly  known as:  SINGULAIR TAKE ONE TABLET BY MOUTH AT BEDTIME.   mupirocin cream 2 % Commonly known as:  BACTROBAN Apply 1 application topically 2 (two) times daily.   nitroGLYCERIN 0.4 MG SL tablet Commonly known as:  NITROSTAT Place 1 tablet (0.4 mg total) under the tongue every 5 (five) minutes as needed for chest pain.   nystatin powder Commonly known as:  nystatin Apply topically 2 (two) times daily. Apply under breasts   oxyCODONE-acetaminophen 5-325 MG tablet Commonly known as:  PERCOCET/ROXICET Take 1 tablet by mouth 2 (two) times daily as needed for severe pain.   OXYGEN Inhale 3 L into the lungs continuous. 3 liters 24/7   pantoprazole 20 MG tablet Commonly known as:  PROTONIX TAKE (1) TABLET BY MOUTH TWICE A DAY BEFORE MEALS. (BREAKFAST AND SUPPER)   potassium chloride 10 MEQ tablet Commonly known as:  K-DUR Take 1 tablet (10 mEq total) by mouth daily.   predniSONE 5 MG tablet Commonly known as:  DELTASONE Take 1 tablet (5 mg total) by mouth daily with breakfast.   PRESCRIPTION MEDICATION Inhale into the lungs See admin instructions. Use BIPAP every time laying down   primidone 50 MG tablet Commonly known as:  MYSOLINE Take 1 tablet (50 mg total) by mouth at bedtime.   PROBIOTIC PO Take 1 tablet by mouth at bedtime.   QUEtiapine 25 MG tablet Commonly known as:  SEROQUEL Take 1 tablet (25 mg total) by mouth at bedtime.   triamcinolone lotion 0.1 % Commonly known as:  KENALOG Apply 1 application topically 3 (three) times daily. For up to 14 days and as needed   TRUE METRIX BLOOD GLUCOSE TEST test strip Generic drug:  glucose blood USE TO TEST BLOOD SUGAR AS DIRECTED.   vitamin A 10000 UNIT capsule Take 10,000 Units by mouth every morning.   warfarin 4 MG tablet Commonly known as:  COUMADIN Take as directed by the anticoagulation clinic. If you are unsure how to take this medication, talk to your nurse or doctor. Original instructions:  Take 1 tablet  daily except 1 1/2 tablets on Saturdays

## 2017-11-07 NOTE — Telephone Encounter (Signed)
pts daughter called.  Pt is upset because she only got 4 prednisone pills.  She is wanting enough pills to get her thru to her appt with Ola Spurr 11-13-17.  Pt says the pain is unbearable.  The prednisone helps with the pain.  Please advise             Daughter called again about the prednisone refill.  This refill MUST be done by 11:00 this morning. The pharmacy starts the delivery at 11:30. If the pharmacy doesn't do the delivery today, pt is out of the medicine for the weekend.  Pt and daughter are very upset about not getting the prednisone refills.  Daughter would like to talk to dr Emmaline Life or dr fitzgerald ASAP to stress the need for this medicine.

## 2017-11-07 NOTE — Patient Instructions (Signed)
Follow up in 6 months 

## 2017-11-09 NOTE — Telephone Encounter (Addendum)
Looks like patient received script for prednisone from pulmonology for prednisone on 11/07/2017. Can follow up with Denise Freeman as needed.

## 2017-11-10 ENCOUNTER — Other Ambulatory Visit: Payer: Self-pay | Admitting: Internal Medicine

## 2017-11-11 ENCOUNTER — Ambulatory Visit (INDEPENDENT_AMBULATORY_CARE_PROVIDER_SITE_OTHER): Payer: Medicare Other | Admitting: *Deleted

## 2017-11-11 DIAGNOSIS — G894 Chronic pain syndrome: Secondary | ICD-10-CM | POA: Diagnosis not present

## 2017-11-11 DIAGNOSIS — Z5181 Encounter for therapeutic drug level monitoring: Secondary | ICD-10-CM

## 2017-11-11 DIAGNOSIS — I130 Hypertensive heart and chronic kidney disease with heart failure and stage 1 through stage 4 chronic kidney disease, or unspecified chronic kidney disease: Secondary | ICD-10-CM | POA: Diagnosis not present

## 2017-11-11 DIAGNOSIS — I5042 Chronic combined systolic (congestive) and diastolic (congestive) heart failure: Secondary | ICD-10-CM | POA: Diagnosis not present

## 2017-11-11 DIAGNOSIS — E1142 Type 2 diabetes mellitus with diabetic polyneuropathy: Secondary | ICD-10-CM | POA: Diagnosis not present

## 2017-11-11 DIAGNOSIS — E1122 Type 2 diabetes mellitus with diabetic chronic kidney disease: Secondary | ICD-10-CM | POA: Diagnosis not present

## 2017-11-11 DIAGNOSIS — I4891 Unspecified atrial fibrillation: Secondary | ICD-10-CM

## 2017-11-11 DIAGNOSIS — J449 Chronic obstructive pulmonary disease, unspecified: Secondary | ICD-10-CM | POA: Diagnosis not present

## 2017-11-11 LAB — POCT INR: INR: 2 (ref 2.0–3.0)

## 2017-11-11 NOTE — Patient Instructions (Signed)
Take coumadin 1 1/2 tablets tonight then resume 1 tablet daily except 1 1/2 tablets on Saturdays Recheck in 2 weeks Order given to [facility]

## 2017-11-12 ENCOUNTER — Ambulatory Visit: Payer: Medicare Other | Admitting: "Endocrinology

## 2017-11-12 ENCOUNTER — Encounter: Payer: Self-pay | Admitting: "Endocrinology

## 2017-11-12 ENCOUNTER — Ambulatory Visit (INDEPENDENT_AMBULATORY_CARE_PROVIDER_SITE_OTHER): Payer: Medicare Other | Admitting: "Endocrinology

## 2017-11-12 ENCOUNTER — Ambulatory Visit: Payer: Self-pay | Admitting: Nutrition

## 2017-11-12 ENCOUNTER — Encounter: Payer: Self-pay | Admitting: Nutrition

## 2017-11-12 ENCOUNTER — Encounter: Payer: Medicare Other | Attending: "Endocrinology | Admitting: Nutrition

## 2017-11-12 VITALS — BP 111/72 | HR 73 | Ht 59.0 in | Wt 172.0 lb

## 2017-11-12 DIAGNOSIS — I10 Essential (primary) hypertension: Secondary | ICD-10-CM

## 2017-11-12 DIAGNOSIS — E1165 Type 2 diabetes mellitus with hyperglycemia: Secondary | ICD-10-CM

## 2017-11-12 DIAGNOSIS — E1159 Type 2 diabetes mellitus with other circulatory complications: Secondary | ICD-10-CM | POA: Insufficient documentation

## 2017-11-12 DIAGNOSIS — E118 Type 2 diabetes mellitus with unspecified complications: Secondary | ICD-10-CM

## 2017-11-12 DIAGNOSIS — Z91199 Patient's noncompliance with other medical treatment and regimen due to unspecified reason: Secondary | ICD-10-CM

## 2017-11-12 DIAGNOSIS — E782 Mixed hyperlipidemia: Secondary | ICD-10-CM | POA: Diagnosis not present

## 2017-11-12 DIAGNOSIS — Z713 Dietary counseling and surveillance: Secondary | ICD-10-CM | POA: Insufficient documentation

## 2017-11-12 DIAGNOSIS — E669 Obesity, unspecified: Secondary | ICD-10-CM

## 2017-11-12 DIAGNOSIS — Z9119 Patient's noncompliance with other medical treatment and regimen: Secondary | ICD-10-CM

## 2017-11-12 DIAGNOSIS — IMO0002 Reserved for concepts with insufficient information to code with codable children: Secondary | ICD-10-CM

## 2017-11-12 MED ORDER — BASAGLAR KWIKPEN 100 UNIT/ML ~~LOC~~ SOPN: 60.0000 [IU] | PEN_INJECTOR | Freq: Every day | SUBCUTANEOUS | 2 refills | Status: DC

## 2017-11-12 MED ORDER — INSULIN LISPRO 100 UNIT/ML (KWIKPEN): 10.0000 [IU] | PEN_INJECTOR | Freq: Three times a day (TID) | SUBCUTANEOUS | 2 refills | Status: DC

## 2017-11-12 NOTE — Progress Notes (Signed)
Endocrinology follow-up note       11/12/2017, 5:37 PM   Subjective:    Patient ID: Denise Freeman, female    DOB: 16-Jan-1951.  she is being seen in follow-up for management of currently uncontrolled symptomatic diabetes requested by  Rogue Bussing, MD.   Past Medical History:  Diagnosis Date  . Allergy   . Anemia   . Anxiety   . Asthma   . Atrial fibrillation ([place])   . Bipolar 1 disorder ([place])   . 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 Cholelithiasis    a. 09/2016 s/p Lap Chole.  . Chronic bronchitis ([place])   . Chronic combined systolic and diastolic congestive heart failure ([place])    a. 09/2016 Echo: EF 30-35%.  . Colon polyps   . COPD (chronic obstructive pulmonary disease) ([place])   . Depression   . Family history of adverse reaction to anesthesia    Uncle was positive for malignant hyperthermia; patient had testing done and was negative.  . Fibromyalgia   . GERD (gastroesophageal reflux disease)   . Gout   . High cholesterol   . History of blood transfusion 06/2015   "bleeding from my rectum"  . History of hiatal hernia   . HOH (hard of hearing)   . Hx of colonic polyps 03/21/2016   3 small adenomas no recall - co-morbidities  . Neuropathy    Disc Back   . NICM (nonischemic cardiomyopathy) ([place])    a. Previously worked up in [place], MD-->low EF with subsequent recovery.  Multiple caths (last ~ 2014 per pt report)--reportedly nl cors;  b. 09/2016 Echo: EF 30-35%, antsept/apical HK, mild MR, mildly dil LA, mod dil RA;  c. 09/2016 Lexi MV: EF 26%, glob HK, sept DK, med size, mod intensity fixed septal defect - BBB/PVC related artifact, no ischemia.  . On home oxygen therapy    "3L; 24/7" (03/14/2016)  . OSA treated with BiPAP    uses biPAP, 10 (03/14/2016)  . Osteoarthritis   . Oxygen deficiency   . Pneumonia   . Type II diabetes mellitus ([place])    Past Surgical  History:  Procedure Laterality Date  . APPENDECTOMY     "they busted"  . bladder stimulator     pt states, "it cannot be turned off; it's in my right hip; dead battery so it's not working anymore". (03/14/2016)  . BLADDER SUSPENSION     2003, 2006 and 2010  . CATARACT EXTRACTION W/PHACO Right 11/29/2014   Procedure: CATARACT EXTRACTION PHACO AND INTRAOCULAR LENS PLACEMENT (IOC);  Surgeon: Rutherford Guys, MD;  Location: [facility];  Service: Ophthalmology;  Laterality: Right;  CDE:3.81  . CATARACT EXTRACTION W/PHACO Left 12/13/2014   Procedure: CATARACT EXTRACTION PHACO AND INTRAOCULAR LENS PLACEMENT (IOC);  Surgeon: Rutherford Guys, MD;  Location: [facility];  Service: Ophthalmology;  Laterality: Left;  CDE:6.59  . CERVICAL DISC SURGERY N/A 2009   4, 6, and 7 cervical disc replaced  . CHOLECYSTECTOMY N/A 09/13/2016   Procedure: LAPAROSCOPIC CHOLECYSTECTOMY;  Surgeon: Rolm Bookbinder, MD;  Location: [facility];  Service: General;  Laterality: N/A;  .  COLONOSCOPY WITH PROPOFOL N/A 03/15/2016   Procedure: COLONOSCOPY WITH PROPOFOL;  Surgeon: Gatha Mayer, MD;  Location: [facility];  Service: Endoscopy;  Laterality: N/A;  . ESOPHAGOGASTRODUODENOSCOPY (EGD) WITH PROPOFOL N/A 03/15/2016   Procedure: ESOPHAGOGASTRODUODENOSCOPY (EGD) WITH PROPOFOL;  Surgeon: Gatha Mayer, MD;  Location: [facility];  Service: Endoscopy;  Laterality: N/A;  . HEEL SPUR SURGERY Bilateral   . HERNIA REPAIR    . I&D EXTREMITY Right 06/13/2015   Procedure: MINOR IRRIGATION AND DEBRIDEMENT EXTREMITY REMOVAL OF NAIL;  Surgeon: Daryll Brod, MD;  Location: [facility];  Service: Orthopedics;  Laterality: Right;  . TUBAL LIGATION    . UMBILICAL HERNIA REPAIR     w/mesh   Social History   Socioeconomic History  . Marital status: Married    Spouse name: Not on file  . Number of children: 1  . Years of education: Not on file  . Highest education level: Not on file  Occupational History  . Occupation: house wife   Social Needs  . Financial resource strain: Not on file  . Food insecurity:    Worry: Not on file    Inability: Not on file  . Transportation needs:    Medical: Not on file    Non-medical: Not on file  Tobacco Use  . Smoking status: Former Smoker    Packs/day: 2.00    Years: 14.00    Pack years: 28.00    Types: Cigarettes    Last attempt to quit: 04/12/1989    Years since quitting: 28.6  . Smokeless tobacco: Never Used  Substance and Sexual Activity  . Alcohol use: No    Alcohol/week: 0.0 oz  . Drug use: No  . Sexual activity: Never  Lifestyle  . Physical activity:    Days per week: Not on file    Minutes per session: Not on file  . Stress: Not on file  Relationships  . Social connections:    Talks on phone: Not on file    Gets together: Not on file    Attends religious service: Not on file    Active member of club or organization: Not on file    Attends meetings of clubs or organizations: Not on file    Relationship status: Not on file  Other Topics Concern  . Not on file  Social History Narrative   Married   Disabled   1 child; 73 yrs   6 pregnancies   1 child   Outpatient Encounter Medications as of 11/12/2017  Medication Sig  . albuterol (PROVENTIL) (2.5 MG/3ML) 0.083% nebulizer solution USE (1) IN NEBULIZER EVERY 6 HOURS AS NEEDED FOR SHORTNESS OF BREATH.  . cetirizine (ZYRTEC) 10 MG tablet Take 1 tablet (10 mg total) by mouth at bedtime.  . cholecalciferol (VITAMIN D) 400 units TABS tablet Take 1 tablet (400 Units total) by mouth 2 (two) times daily.  . colchicine 0.6 MG tablet Take 1 tablet (0.6 mg total) by mouth daily. If have stomach upset, can reduce to every other day.  . docusate sodium (COLACE) 100 MG capsule Take 100 mg by mouth daily.  Marland Kitchen escitalopram (LEXAPRO) 10 MG tablet Take 1 tablet (10 mg total) by mouth at bedtime.  . fenofibrate 54 MG tablet TAKE 1 TABLET BY MOUTH ONCE DAILY.  . ferrous sulfate 324 (65 Fe) MG TBEC Take 1 tablet by mouth  daily.  . furosemide (LASIX) 40 MG tablet Take 2 tablets (80 mg total) by mouth 2 (two) times daily.  Marland Kitchen  Insulin Glargine (BASAGLAR KWIKPEN) 100 UNIT/ML SOPN Inject 0.6 mLs (60 Units total) into the skin at bedtime.  . insulin lispro (HUMALOG KWIKPEN) 100 UNIT/ML KiwkPen Inject 0.1-0.16 mLs (10-16 Units total) into the skin 3 (three) times daily before meals.  . Insulin Pen Needle 32G X 4 MM MISC Use to inject insulin 5 times daily  . ipratropium (ATROVENT) 0.02 % nebulizer solution USE (1) IN NEBULIZER EVERY 6 HOURS AS NEEDED FOR SHORTNESS OF BREATH.  Marland Kitchen losartan (COZAAR) 25 MG tablet Take 0.5 tablets (12.5 mg total) by mouth daily.  . metolazone (ZAROXOLYN) 2.5 MG tablet Take 1 tablet (2.5 mg total) by mouth daily. Take 30 minutes prior to morning LASIX  . metoprolol succinate (TOPROL-XL) 25 MG 24 hr tablet Take 1 tablet (25 mg total) by mouth 2 (two) times daily. Please call to make appointment for further refills.  . mirtazapine (REMERON) 15 MG tablet Take 1 tablet (15 mg total) by mouth at bedtime.  . modafinil (PROVIGIL) 200 MG tablet Take 1 tablet (200 mg total) by mouth daily.  . mometasone (NASONEX) 50 MCG/ACT nasal spray Place 1 spray into the nose daily.  . montelukast (SINGULAIR) 10 MG tablet TAKE ONE TABLET BY MOUTH AT BEDTIME.  . mupirocin cream (BACTROBAN) 2 % Apply 1 application topically 2 (two) times daily.  . nitroGLYCERIN (NITROSTAT) 0.4 MG SL tablet Place 1 tablet (0.4 mg total) under the tongue every 5 (five) minutes as needed for chest pain.  Marland Kitchen nystatin (NYSTATIN) powder Apply topically 2 (two) times daily. Apply under breasts  . oxyCODONE-acetaminophen (PERCOCET/ROXICET) 5-325 MG tablet Take 1 tablet by mouth 2 (two) times daily as needed for severe pain.  . OXYGEN Inhale 3 L into the lungs continuous. 3 liters 24/7  . pantoprazole (PROTONIX) 20 MG tablet TAKE (1) TABLET BY MOUTH TWICE A DAY BEFORE MEALS. (BREAKFAST AND SUPPER)  . potassium chloride (K-DUR) 10 MEQ tablet Take  1 tablet (10 mEq total) by mouth daily.  . predniSONE (DELTASONE) 5 MG tablet Take 1 tablet (5 mg total) by mouth daily with breakfast.  . PRESCRIPTION MEDICATION Inhale into the lungs See admin instructions. Use BIPAP every time laying down  . primidone (MYSOLINE) 50 MG tablet TAKE ONE TABLET BY MOUTH AT BEDTIME.  . Probiotic Product (PROBIOTIC PO) Take 1 tablet by mouth at bedtime.  Marland Kitchen QUEtiapine (SEROQUEL) 25 MG tablet Take 1 tablet (25 mg total) by mouth at bedtime.  . triamcinolone lotion (KENALOG) 0.1 % Apply 1 application topically 3 (three) times daily. For up to 14 days and as needed  . TRUE METRIX BLOOD GLUCOSE TEST test strip USE TO TEST BLOOD SUGAR AS DIRECTED.  Marland Kitchen vitamin A 10000 UNIT capsule Take 10,000 Units by mouth every morning.  . warfarin (COUMADIN) 4 MG tablet Take 1 tablet daily except 1 1/2 tablets on Saturdays  . [DISCONTINUED] cephALEXin (KEFLEX) 500 MG capsule Take 1 capsule (500 mg total) by mouth 4 (four) times daily.  . [DISCONTINUED] Continuous Blood Gluc Sensor (FREESTYLE LIBRE SENSOR SYSTEM) MISC Use one sensor every 10 days.  . [DISCONTINUED] Insulin Glargine (BASAGLAR KWIKPEN) 100 UNIT/ML SOPN Inject 0.5 mLs (50 Units total) into the skin at bedtime.  . [DISCONTINUED] insulin lispro (HUMALOG KWIKPEN) 100 UNIT/ML KiwkPen Inject 0.15-0.21 mLs (15-21 Units total) into the skin 3 (three) times daily.   No facility-administered encounter medications on file as of 11/12/2017.     ALLERGIES: Allergies  Allergen Reactions  . Xarelto [Rivaroxaban] Other (See Comments)    Internal  bleeding  . Ancef [Cefazolin] Nausea And Vomiting  . Levaquin [Levofloxacin In D5w] Other (See Comments)    "afib"  . Lyrica [Pregabalin] Hives  . Tamiflu [Oseltamivir Phosphate] Other (See Comments)    "water blisters"  . Zoloft [Sertraline Hcl] Other (See Comments)    Jaw problems, jittery  . Augmentin [Amoxicillin-Pot Clavulanate] Itching  . Ciprofloxacin Itching and Nausea And  Vomiting  . Haldol [Haloperidol] Other (See Comments)    Restless leg  . Nsaids Diarrhea  . Penicillins Itching, Nausea And Vomiting and Rash    Has patient had a PCN reaction causing immediate rash, facial/tongue/throat swelling, SOB or lightheadedness with hypotension: Yes Has patient had a PCN reaction causing severe rash involving mucus membranes or skin necrosis: No Has patient had a PCN reaction that required hospitalization No Has patient had a PCN reaction occurring within the last 10 years: Yes If all of the above answers are "NO", then may proceed with Cephalosporin use.  . Topamax [Topiramate] Nausea Only    VACCINATION STATUS: Immunization History  Administered Date(s) Administered  . Influenza Split 03/03/2014  . Influenza,inj,Quad PF,6+ Mos 01/30/2016, 01/27/2017  . Influenza-Unspecified 02/06/2015  . Pneumococcal Conjugate-13 01/30/2016  . Pneumococcal Polysaccharide-23 11/24/2016  . Tetanus 06/03/2013    Diabetes  She presents for her follow-up diabetic visit. She has type 2 diabetes mellitus. Onset time: She was diagnosed at approximate age of 67 years. Her disease course has been improving. There are no hypoglycemic associated symptoms. Pertinent negatives for hypoglycemia include no confusion, headaches, pallor or seizures. Associated symptoms include fatigue, polydipsia, polyphagia and visual change. Pertinent negatives for diabetes include no chest pain and no polyuria. There are no hypoglycemic complications. Symptoms are improving. Diabetic complications include heart disease, nephropathy, peripheral neuropathy and retinopathy. Risk factors for coronary artery disease include family history, dyslipidemia, diabetes mellitus, hypertension, obesity, post-menopausal, sedentary lifestyle and tobacco exposure. Current diabetic treatment includes insulin injections and oral agent (monotherapy) (She is currently on metformin 500 mg by mouth twice a day, Basaglar 40 units every  bedtime , Humalog 10 units 3 times a day before meals.). Her weight is stable. She is following a generally unhealthy diet. When asked about meal planning, she reported none. She has not had a previous visit with a dietitian. She never participates in exercise. Her breakfast blood glucose range is generally >200 mg/dl. Her lunch blood glucose range is generally >200 mg/dl. Her dinner blood glucose range is generally >200 mg/dl. Her bedtime blood glucose range is generally >200 mg/dl. Her overall blood glucose range is >200 mg/dl. An ACE inhibitor/angiotensin II receptor blocker is being taken. She does not see a podiatrist.Eye exam is current (She is legally blind on both eyes reportedly unrelated to her diabetes.).  Hyperlipidemia  This is a chronic problem. The current episode started more than 1 year ago. The problem is controlled. Exacerbating diseases include diabetes, hypothyroidism and obesity. Pertinent negatives include no chest pain, myalgias or shortness of breath. Current antihyperlipidemic treatment includes statins and fibric acid derivatives. Risk factors for coronary artery disease include diabetes mellitus, dyslipidemia, family history, hypertension, obesity, a sedentary lifestyle and post-menopausal.  Hypertension  This is a chronic problem. The current episode started more than 1 year ago. Pertinent negatives include no chest pain, headaches, palpitations or shortness of breath. Risk factors for coronary artery disease include dyslipidemia, diabetes mellitus, obesity, sedentary lifestyle and smoking/tobacco exposure. Past treatments include angiotensin blockers and diuretics. Hypertensive end-organ damage includes retinopathy.     Review of Systems  Constitutional: Positive for fatigue. Negative for chills, fever and unexpected weight change.  HENT: Negative for trouble swallowing and voice change.   Eyes: Negative for visual disturbance.  Respiratory: Negative for cough, shortness of  breath and wheezing.   Cardiovascular: Negative for chest pain, palpitations and leg swelling.  Gastrointestinal: Negative for diarrhea, nausea and vomiting.  Endocrine: Positive for polydipsia and polyphagia. Negative for cold intolerance, heat intolerance and polyuria.  Musculoskeletal: Positive for gait problem. Negative for arthralgias and myalgias.       Patient uses a specialized walker due to deconditioning, disequilibrium, blindness.  Skin: Negative for color change, pallor, rash and wound.  Neurological: Negative for seizures and headaches.  Psychiatric/Behavioral: Negative for confusion and suicidal ideas.    Objective:    BP 111/72   Pulse 73   Ht 4\' 11"  (1.499 m)   Wt 172 lb (78 kg)   BMI 34.74 kg/m   Wt Readings from Last 3 Encounters:  11/12/17 172 lb (78 kg)  11/07/17 171 lb 6.4 oz (77.7 kg)  10/16/17 181 lb (82.1 kg)     Physical Exam  Constitutional: She is oriented to person, place, and time. She appears well-developed.  HENT:  Head: Normocephalic and atraumatic.  Eyes: EOM are normal.  Neck: Normal range of motion. Neck supple. No tracheal deviation present. No thyromegaly present.  Cardiovascular: Normal rate and regular rhythm.  Pulmonary/Chest: Effort normal and breath sounds normal.  Abdominal: Soft. Bowel sounds are normal. There is no tenderness. There is no guarding.  Musculoskeletal: She exhibits no edema.  She uses a specialized walker to ambulate due to deconditioning, disequilibrium, blindness.  Neurological: She is alert and oriented to person, place, and time. She has normal reflexes. No cranial nerve deficit. Coordination normal.  Hard of hearing.  Skin: Skin is warm and dry. No rash noted. No erythema. No pallor.  Psychiatric: She has a normal mood and affect. Judgment normal.    CMP     Component Value Date/Time   NA 131 (L) 11/05/2017 0927   NA 135 07/29/2017 1115   K 3.0 (L) 11/05/2017 0927   CL <80 (L) 11/05/2017 0927   CO2 40 (H)  11/05/2017 0927   GLUCOSE 205 (H) 11/05/2017 0927   BUN 83 (H) 11/05/2017 0927   BUN 71 (H) 07/29/2017 1115   CREATININE 1.86 (H) 11/05/2017 0927   CALCIUM 9.4 11/05/2017 0927   PROT 7.4 11/05/2017 0927   PROT 6.9 07/29/2017 1115   ALBUMIN 4.3 10/08/2017 1343   ALBUMIN 4.1 07/29/2017 1115   AST 15 11/05/2017 0927   ALT 8 11/05/2017 0927   ALKPHOS 46 10/08/2017 1343   BILITOT 0.6 11/05/2017 0927   BILITOT 0.3 07/29/2017 1115   GFRNONAA 28 (L) 11/05/2017 0927   GFRAA 32 (L) 11/05/2017 0927     Diabetic Labs (most recent): Lab Results  Component Value Date   HGBA1C 8.6 (H) 11/05/2017   HGBA1C 7.7 (H) 07/29/2017   HGBA1C 8.3 (H) 04/01/2017     Lipid Panel ( most recent) Lipid Panel     Component Value Date/Time   CHOL 163 07/29/2017 1115   TRIG 183 (H) 07/29/2017 1115   HDL 40 07/29/2017 1115   CHOLHDL 4.1 07/29/2017 1115   CHOLHDL 6.2 09/10/2016 1147   VLDL 30 09/10/2016 1147   LDLCALC 86 07/29/2017 1115   LDLDIRECT 153.0 04/19/2016 0932      Assessment & Plan:   1. DM type 2 causing vascular disease , Stage 3 renal insufficiency,  blindness - Patient has currently uncontrolled symptomatic type 2 DM since  67 years of age. -Recently she was given steroids due to upper respiratory tract infection which increased her blood glucose readings significantly higher than target.  She did not document  hypoglycemia.   -Her A1c is higher at 8.6%, increasing from 7.7%.     -her diabetes is complicated by coronary artery disease, physical deconditioning, stage 3 renal insufficiency, blindness and Bebe L Augusta remains at a high risk for more acute and chronic complications which include CAD, CVA, CKD, retinopathy, and neuropathy. These are all discussed in detail with the patient.  - I have counseled her on diet management and weight loss, by adopting a carbohydrate restricted/protein rich diet.  -  Suggestion is made for her to avoid simple carbohydrates  from her diet  including Cakes, Sweet Desserts / Pastries, Ice Cream, Soda (diet and regular), Sweet Tea, Candies, Chips, Cookies, Store Bought Juices, Alcohol in Excess of  1-2 drinks a day, Artificial Sweeteners, and "Sugar-free" Products. This will help patient to have stable blood glucose profile and potentially avoid unintended weight gain.  - I encouraged her to switch to  unprocessed or minimally processed complex starch and increased protein intake (animal or plant source), fruits, and vegetables.  - she is advised to stick to a routine mealtimes to eat 3 meals  a day and avoid unnecessary snacks ( to snack only to correct hypoglycemia).   - she has been scheduled with Jearld Fenton, RDN, CDE for individualized diabetes education.  - I have approached her with the following individualized plan to manage diabetes and patient agrees:   - Given her blindness, it has been very difficult to give her intensive insulin therapy. Her husband , who is already doing everything for her, is not here today.   - The #1 priority in her diabetes treatment  would be to avoid hypoglycemia.   - I approach her for slowly adjusting her treatment and she reluctantly accepts.   -I advised her to increase his Basaglar to 60 units nightly, continue prandial insulin Humalog  10 units 3 times a day before meals for pre-meal blood glucose above 90 mg/dL . - She is advised to wear her CGM device at all times.    - Patient is warned not to take insulin without proper monitoring per orders. -Adjustment parameters are given for hypo and hyperglycemia in writing. -Patient is encouraged to call clinic for blood glucose levels less than 70 or above 300 mg /dl.  -She is advised to discontinue metformin due to CKD, and not a suitable  candidate for SGLT2 inhibitors.   - Patient specific target  A1c;  LDL, HDL, Triglycerides, and  Waist Circumference were discussed in detail.  2) BP/HTN: Her blood pressure is controlled to target.  she  is advised to continue her current blood pressure medications including losartan.   3) Lipids/HPL: Her recent lipid panel showed controlled LDL at 86.  Controlled.    4)  Weight/Diet: CDE Consult has been  initiated , exercise, and detailed carbohydrates information provided.  5) Chronic Care/Health Maintenance:  -she  is on ACEI/ARB and Statin medications and  is encouraged to continue to follow up with Ophthalmology, Dentist,  Podiatrist at least yearly or according to recommendations, and advised to  stay away from smoking. I have recommended yearly flu vaccine and pneumonia vaccination at least every 5 years; and  sleep for at least 7 hours a day.  - I advised  patient to maintain close follow up with Rogue Bussing, MD for primary care needs.  - Time spent with the patient: 25 min, of which >50% was spent in reviewing her blood glucose logs , discussing her hypo- and hyper-glycemic episodes, reviewing her current and  previous labs and insulin doses and developing a plan to avoid hypo- and hyper-glycemia. Please refer to Patient Instructions for Blood Glucose Monitoring and Insulin/Medications Dosing Guide"  in media tab for additional information. Odesser L Knight participated in the discussions, expressed understanding, and voiced agreement with the above plans.  All questions were answered to her satisfaction. she is encouraged to contact clinic should she have any questions or concerns prior to her return visit.  Follow up plan: - Return in about 3 months (around 02/12/2018) for follow up with pre-visit labs, meter, and logs.  Glade Lloyd, MD [facility] [address] [place], [place] [address] Phone: [phone]  Fax: [phone]    11/12/2017, 5:37 PM  This note was partially dictated with voice recognition software. Similar sounding words can be transcribed inadequately or may not  be corrected upon review.

## 2017-11-12 NOTE — Progress Notes (Signed)
  Medical Nutrition Therapy:  Appt start time: 1500 end time:  9702.   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 COPD, h/o CHF, Sleep Apnea.  Lives with her husband. She is legally blind. Uses a walker and has portable oxygen.  Has Stg 4 CKD now with Creatine 1.86 and eGFR , 30.  Currently on  Saw Dr. Dorris Fetch today. Has the [place]. Currently on Predisone for Gout. Says she knows what food to avoid for her Gout. Doesn't have much of an appetite with her Gout Flare up. Has libre.  A1C up to 8.6%.  Lost 7  Lbs.  Lanuts 60 units and Humalog 10 units plus sliding scale with meals.  Lab Results  Component Value Date   HGBA1C 8.6 (H) 11/05/2017   CMP Latest Ref Rng & Units 11/05/2017 10/08/2017 07/29/2017  Glucose 65 - 99 mg/dL 205(H) 125(H) 204(H)  BUN 7 - 25 mg/dL 83(H) 58(H) 71(H)  Creatinine 0.50 - 0.99 mg/dL 1.86(H) 1.56(H) 1.67(H)  Sodium 135 - 146 mmol/L 131(L) 138 135  Potassium 3.5 - 5.3 mmol/L 3.0(L) 3.0(L) 3.5  Chloride 98 - 110 mmol/L <80(L) 89(L) 87(L)  CO2 20 - 32 mmol/L 40(H) 35(H) 30(H)  Calcium 8.6 - 10.4 mg/dL 9.4 8.9 8.7  Total Protein 6.1 - 8.1 g/dL 7.4 7.7 6.9  Total Bilirubin 0.2 - 1.2 mg/dL 0.6 1.0 0.3  Alkaline Phos 38 - 126 U/L - 46 100  AST 10 - 35 U/L '15 17 15  '$ ALT 6 - 29 U/L 8 9(L) 7     Preferred Learning Style:  Auditory and hands on   Learning Readiness:  Ready  Change in progress   MEDICATIONS:   DIETARY INTAKE:     24-hr recall:  B ( AM): eggs and toast, L ( PM): half sandwich chicken or tuna fish or PB and  Fruit.,  water Snk ( PM):  D ( PM):chicken corn, green beans,  Snk ( PM): sugar free popcycle Beverages: water  Usual physical activity: ADL   Estimated energy needs: 1200  calories 135 g carbohydrates 90 g protein 33 g fat  Progress Towards Goal(s):  In progress.   Nutritional Diagnosis:  NB-1.1 Food and nutrition-related knowledge deficit As related to Diabetes.  As evidenced by A1C.    Intervention:   Nutrition and Diabetes education provided on My Plate, CHO counting, meal planning, portion sizes, timing of meals, avoiding snacks between meals unless having a low blood sugar, target ranges for A1C and blood sugars, signs/symptoms and treatment of hyper/hypoglycemia, monitoring blood sugars, taking medications as prescribed, benefits of exercising 30 minutes per day and prevention of complications of DM.  AVoid foods high in purines. Increase water intake Eat more fresh fruits and vegetables. Get A1C down to 8% or less.   Teaching Method Utilized:  Leisure centre manager Hands on  Handouts given during visit include:  The Plate Method   Barriers to learning/adherence to lifestyle change: none  Demonstrated degree of understanding via:  Teach Back   Monitoring/Evaluation:  Dietary intake, exercise, meal planning, SBG, and body weight in PRN. Pt. Notes she knows what to do and doesn't want to see me anymore. Advised to call if she changes her mind.

## 2017-11-12 NOTE — Patient Instructions (Addendum)
AVoid foods high in purines. Increase water intake Eat more fresh fruits and vegetables. Get A1C down to 8% or less.

## 2017-11-12 NOTE — Patient Instructions (Signed)

## 2017-11-13 ENCOUNTER — Other Ambulatory Visit: Payer: Self-pay

## 2017-11-13 ENCOUNTER — Ambulatory Visit: Payer: Self-pay | Admitting: Internal Medicine

## 2017-11-13 ENCOUNTER — Ambulatory Visit (INDEPENDENT_AMBULATORY_CARE_PROVIDER_SITE_OTHER): Payer: Medicare Other | Admitting: Internal Medicine

## 2017-11-13 VITALS — BP 129/70 | HR 64 | Temp 97.9°F | Ht 59.0 in | Wt 173.4 lb

## 2017-11-13 DIAGNOSIS — M109 Gout, unspecified: Secondary | ICD-10-CM

## 2017-11-13 DIAGNOSIS — N189 Chronic kidney disease, unspecified: Secondary | ICD-10-CM

## 2017-11-13 MED ORDER — OXYCODONE-ACETAMINOPHEN 5-325 MG PO TABS
1.0000 | ORAL_TABLET | Freq: Two times a day (BID) | ORAL | 0 refills | Status: DC | PRN
Start: 2017-11-13 — End: 2017-11-27

## 2017-11-13 NOTE — Patient Instructions (Signed)
Ms. Merritts,  I think we will need to adjust your gout medication based on kidney function. I think we should slowly reduce the steroid. I will call with lab results and medication suggestions tomorrow.  Please return in 2 weeks for uric acid level check.   Best, Dr. Ola Spurr
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
## 2017-11-14 ENCOUNTER — Telehealth: Payer: Self-pay | Admitting: Internal Medicine

## 2017-11-14 LAB — COMPREHENSIVE METABOLIC PANEL
ALT: 12 IU/L (ref 0–32)
AST: 23 IU/L (ref 0–40)
Albumin/Globulin Ratio: 1.7 (ref 1.2–2.2)
Albumin: 4.5 g/dL (ref 3.6–4.8)
Alkaline Phosphatase: 54 IU/L (ref 39–117)
BILIRUBIN TOTAL: 0.2 mg/dL (ref 0.0–1.2)
BUN/Creatinine Ratio: 33 — ABNORMAL HIGH (ref 12–28)
BUN: 47 mg/dL — AB (ref 8–27)
CHLORIDE: 88 mmol/L — AB (ref 96–106)
CO2: 31 mmol/L — AB (ref 20–29)
CREATININE: 1.44 mg/dL — AB (ref 0.57–1.00)
Calcium: 9.1 mg/dL (ref 8.7–10.3)
GFR calc Af Amer: 44 mL/min/{1.73_m2} — ABNORMAL LOW (ref >59)
GFR calc non Af Amer: 38 mL/min/{1.73_m2} — ABNORMAL LOW (ref >59)
GLUCOSE: 184 mg/dL — AB (ref 65–99)
Globulin, Total: 2.6 g/dL (ref 1.5–4.5)
Potassium: 3.8 mmol/L (ref 3.5–5.2)
Sodium: 138 mmol/L (ref 134–144)
TOTAL PROTEIN: 7.1 g/dL (ref 6.0–8.5)

## 2017-11-14 LAB — URIC ACID: URIC ACID: 9.5 mg/dL — AB (ref 2.5–7.1)

## 2017-11-14 MED ORDER — ALLOPURINOL 100 MG PO TABS: 50.0000 mg | ORAL_TABLET | Freq: Every day | ORAL | 1 refills | Status: DC

## 2017-11-14 MED ORDER — COLCHICINE 0.6 MG PO TABS: 0.6000 mg | ORAL_TABLET | Freq: Every day | ORAL | 1 refills | Status: DC

## 2017-11-14 MED ORDER — PREDNISONE 1 MG PO TABS: ORAL_TABLET | ORAL | 0 refills | Status: DC

## 2017-11-14 NOTE — Telephone Encounter (Signed)
Left message for patient about lab results with some improvement in kidney function. Ordered allopurinol 50 mg and reordered colchicine 0.6 mg, as initiating allopurinol. Uric acid level still elevated but improved from 3 weeks ago. Recommended returning for repeat labs in 2 weeks, including uric acid level and BMP.  [name], MD [place], PGY-3

## 2017-11-16 ENCOUNTER — Encounter: Payer: Self-pay | Admitting: Internal Medicine

## 2017-11-16 DIAGNOSIS — N189 Chronic kidney disease, unspecified: Secondary | ICD-10-CM | POA: Insufficient documentation

## 2017-11-16 DIAGNOSIS — M109 Gout, unspecified: Secondary | ICD-10-CM | POA: Insufficient documentation

## 2017-11-16 NOTE — Progress Notes (Signed)
[name] Progress Note  Subjective:  Denise Freeman is a 67 y.o. female with history of multiple chronic comorbidities including T2DM with polyneuropathy, fibromyalgia, chronic knee pain, bipolar disorder, afib on coumadin, COPD on chronic O2, DDD with history of spinal injections per patient, urinary incontinence with bladder stimulator that does not work who presents for follow-up of gout. She was given a 5-day prednisone course of 40 mg on 5/22, as she cannot tolerate NSAIDs due to GI bleed and intolerance with diarrhea, and was having only some improvement several days later so prednisone taper and colchicine ordered on 5/28. She had prednisone continued at her request by her pulmonologist on 6/7 with #30 prednisone 5 mg daily. She is able to bear weight at this point but continues to complain of pain of left foot and right leg pain that radiates from back down leg. She has had negative imaging for fracture with xrays of feet, lower legs, right femur and pelvis as part of work-up. She attributes more of her pain to chronic back problems and is waiting for approval to be seen at new pain clinic after a disagreement at [facility] after taking more than twice daily percocet 5-325 she says as she was instructed by ED provider. She has been limiting red meat and does not drink alcohol. ROS: No recent falls, no bowel/bladder incontinence  She is frustrated by recent care and minimal improvement in pain and plans to switch to another PCP closer to home in [place] once approved to see new pain clinic provider.   Allergies  Allergen Reactions  . Xarelto [Rivaroxaban] Other (See Comments)    Internal bleeding  . Ancef [Cefazolin] Nausea And Vomiting  . Levaquin [Levofloxacin In D5w] Other (See Comments)    "afib"  . Lyrica [Pregabalin] Hives  . Tamiflu [Oseltamivir Phosphate] Other (See Comments)    "water blisters"  . Zoloft [Sertraline Hcl] Other (See Comments)   Jaw problems, jittery  . Augmentin [Amoxicillin-Pot Clavulanate] Itching  . Ciprofloxacin Itching and Nausea And Vomiting  . Haldol [Haloperidol] Other (See Comments)    Restless leg  . Nsaids Diarrhea  . Penicillins Itching, Nausea And Vomiting and Rash    Has patient had a PCN reaction causing immediate rash, facial/tongue/throat swelling, SOB or lightheadedness with hypotension: Yes Has patient had a PCN reaction causing severe rash involving mucus membranes or skin necrosis: No Has patient had a PCN reaction that required hospitalization No Has patient had a PCN reaction occurring within the last 10 years: Yes If all of the above answers are "NO", then may proceed with Cephalosporin use.  . Topamax [Topiramate] Nausea Only    Social History   Tobacco Use  . Smoking status: Former Smoker    Packs/day: 2.00    Years: 14.00    Pack years: 28.00    Types: Cigarettes    Last attempt to quit: 04/12/1989    Years since quitting: 28.6  . Smokeless tobacco: Never Used  Substance Use Topics  . Alcohol use: No    Alcohol/week: 0.0 oz    Objective: Blood pressure 129/70, pulse 64, temperature 97.9 F (36.6 C), temperature source Oral, height 4\' 11"  (1.499 m), weight 173 lb 6.4 oz (78.7 kg), SpO2 99 %. Body mass index is 35.02 kg/m. Constitutional: Obese female in NAD Musculoskeletal: Reports discomfort with plantar and dorsiflexion of toes. Can tolerate palpation of feet and lower legs (previously would cry out in pain).  Neurological: Reports decreased sensation over  feet (chronic) Skin: Skin is warm and dry. No erythema noted.  Psychiatric: Pressured speech (improved from previous visit, however), irritated  Vitals reviewed  Of note, had worsening kidney function on 11/05/17 with SCr of 1.86.   Assessment/Plan: Gout - Improving. Suspect no longer having acute flare given improvement on exam, though patient still reporting significant pain. Will recheck uric acid level and kidney  function to determine appropriate allopurinol with colchicine dosing. - Anticipate starting 50 mg allopurinol with 0.6 mg colchicine but will call patient with results and ongoing plan tomorrow.  - Patient to complete 30 day course of 5 mg prednisone (likely helping back pain to some extent) and then take 2 mg x 1 week and 1 mg x 1 week thereafter.  - Refilled chronic pain medication of percocet 5-325 #60 daily x 1 month until patient can be seen by pain management clinic. Reviewed [place] controlled substance database--no red flags noted. Explained to patient she would need to be seen in clinic prior to considering any more refills if pending referral to pain clinic not approved in 1 month. Had patient complete ROI forms for [facility] clinic and [facility] [place] notes to send to [facility] that is potential new pain clinic.  Chronic kidney disease - Recent labwork shows worsening to Stage IV from IIIb. May be secondary to gout flare. - Obtain CMP. - Patient has appointment to establish care with Nephrology next week.   Follow-up in 2 weeks for labs (BMP, uric acid) for continued allopurinol dosing.  [name], MD [place], PGY-3

## 2017-11-16 NOTE — Assessment & Plan Note (Signed)
-   Improving. Suspect no longer having acute flare given improvement on exam, though patient still reporting significant pain. Will recheck uric acid level and kidney function to determine appropriate allopurinol with colchicine dosing. - Anticipate starting 50 mg allopurinol with 0.6 mg colchicine but will call patient with results and ongoing plan tomorrow.  - Patient to complete 30 day course of 5 mg prednisone (likely helping back pain to some extent) and then take 2 mg x 1 week and 1 mg x 1 week thereafter.  - Refilled chronic pain medication of percocet 5-325 #60 daily x 1 month until patient can be seen by pain management clinic. Reviewed [place] controlled substance database--no red flags noted. Explained to patient she would need to be seen in clinic prior to considering any more refills if pending referral to pain clinic not approved in 1 month. Had patient complete ROI forms for [facility] clinic and [facility] [place] [facility] notes to send to [facility] that is potential new pain clinic.

## 2017-11-16 NOTE — Assessment & Plan Note (Signed)
-   Recent labwork shows worsening to Stage IV from IIIb. May be secondary to gout flare. - Obtain CMP. - Patient has appointment to establish care with Nephrology next week.

## 2017-11-17 ENCOUNTER — Encounter (HOSPITAL_COMMUNITY): Payer: Self-pay | Admitting: Psychiatry

## 2017-11-17 ENCOUNTER — Ambulatory Visit (INDEPENDENT_AMBULATORY_CARE_PROVIDER_SITE_OTHER): Payer: Medicare Other | Admitting: Psychiatry

## 2017-11-17 VITALS — BP 104/58 | HR 69 | Ht 59.0 in | Wt 175.0 lb

## 2017-11-17 DIAGNOSIS — F0630 Mood disorder due to known physiological condition, unspecified: Secondary | ICD-10-CM

## 2017-11-18 ENCOUNTER — Telehealth: Payer: Self-pay | Admitting: Internal Medicine

## 2017-11-18 NOTE — Telephone Encounter (Signed)
Faxed singed orders r/t warfarin & PT/INR to [facility] @ [phone]

## 2017-11-19 DIAGNOSIS — N183 Chronic kidney disease, stage 3 (moderate): Secondary | ICD-10-CM | POA: Diagnosis not present

## 2017-11-19 DIAGNOSIS — I509 Heart failure, unspecified: Secondary | ICD-10-CM | POA: Diagnosis not present

## 2017-11-19 DIAGNOSIS — D638 Anemia in other chronic diseases classified elsewhere: Secondary | ICD-10-CM | POA: Diagnosis not present

## 2017-11-19 DIAGNOSIS — E876 Hypokalemia: Secondary | ICD-10-CM | POA: Diagnosis not present

## 2017-11-19 DIAGNOSIS — M109 Gout, unspecified: Secondary | ICD-10-CM | POA: Diagnosis not present

## 2017-11-20 DIAGNOSIS — E1122 Type 2 diabetes mellitus with diabetic chronic kidney disease: Secondary | ICD-10-CM | POA: Diagnosis not present

## 2017-11-20 DIAGNOSIS — G894 Chronic pain syndrome: Secondary | ICD-10-CM | POA: Diagnosis not present

## 2017-11-20 DIAGNOSIS — I5042 Chronic combined systolic (congestive) and diastolic (congestive) heart failure: Secondary | ICD-10-CM | POA: Diagnosis not present

## 2017-11-20 DIAGNOSIS — I130 Hypertensive heart and chronic kidney disease with heart failure and stage 1 through stage 4 chronic kidney disease, or unspecified chronic kidney disease: Secondary | ICD-10-CM | POA: Diagnosis not present

## 2017-11-20 DIAGNOSIS — J449 Chronic obstructive pulmonary disease, unspecified: Secondary | ICD-10-CM | POA: Diagnosis not present

## 2017-11-20 DIAGNOSIS — E1142 Type 2 diabetes mellitus with diabetic polyneuropathy: Secondary | ICD-10-CM | POA: Diagnosis not present

## 2017-11-21 DIAGNOSIS — I5042 Chronic combined systolic (congestive) and diastolic (congestive) heart failure: Secondary | ICD-10-CM | POA: Diagnosis not present

## 2017-11-21 DIAGNOSIS — J449 Chronic obstructive pulmonary disease, unspecified: Secondary | ICD-10-CM | POA: Diagnosis not present

## 2017-11-21 DIAGNOSIS — E1122 Type 2 diabetes mellitus with diabetic chronic kidney disease: Secondary | ICD-10-CM | POA: Diagnosis not present

## 2017-11-21 DIAGNOSIS — E1142 Type 2 diabetes mellitus with diabetic polyneuropathy: Secondary | ICD-10-CM | POA: Diagnosis not present

## 2017-11-21 DIAGNOSIS — G894 Chronic pain syndrome: Secondary | ICD-10-CM | POA: Diagnosis not present

## 2017-11-21 DIAGNOSIS — I130 Hypertensive heart and chronic kidney disease with heart failure and stage 1 through stage 4 chronic kidney disease, or unspecified chronic kidney disease: Secondary | ICD-10-CM | POA: Diagnosis not present

## 2017-11-25 ENCOUNTER — Ambulatory Visit (INDEPENDENT_AMBULATORY_CARE_PROVIDER_SITE_OTHER): Payer: Medicare Other

## 2017-11-25 ENCOUNTER — Ambulatory Visit (INDEPENDENT_AMBULATORY_CARE_PROVIDER_SITE_OTHER): Payer: Medicare Other | Admitting: Pharmacist

## 2017-11-25 VITALS — Ht 59.0 in | Wt 173.0 lb

## 2017-11-25 DIAGNOSIS — I130 Hypertensive heart and chronic kidney disease with heart failure and stage 1 through stage 4 chronic kidney disease, or unspecified chronic kidney disease: Secondary | ICD-10-CM | POA: Diagnosis not present

## 2017-11-25 DIAGNOSIS — G894 Chronic pain syndrome: Secondary | ICD-10-CM | POA: Diagnosis not present

## 2017-11-25 DIAGNOSIS — E1122 Type 2 diabetes mellitus with diabetic chronic kidney disease: Secondary | ICD-10-CM | POA: Diagnosis not present

## 2017-11-25 DIAGNOSIS — I4891 Unspecified atrial fibrillation: Secondary | ICD-10-CM

## 2017-11-25 DIAGNOSIS — J449 Chronic obstructive pulmonary disease, unspecified: Secondary | ICD-10-CM | POA: Diagnosis not present

## 2017-11-25 DIAGNOSIS — E1142 Type 2 diabetes mellitus with diabetic polyneuropathy: Secondary | ICD-10-CM | POA: Diagnosis not present

## 2017-11-25 DIAGNOSIS — E1165 Type 2 diabetes mellitus with hyperglycemia: Secondary | ICD-10-CM

## 2017-11-25 DIAGNOSIS — I5042 Chronic combined systolic (congestive) and diastolic (congestive) heart failure: Secondary | ICD-10-CM | POA: Diagnosis not present

## 2017-11-25 LAB — POCT INR: INR: 2.6 (ref 2.0–3.0)

## 2017-11-27 ENCOUNTER — Other Ambulatory Visit (HOSPITAL_COMMUNITY): Payer: Self-pay | Admitting: Nephrology

## 2017-11-27 ENCOUNTER — Other Ambulatory Visit: Payer: Self-pay

## 2017-11-27 ENCOUNTER — Encounter: Payer: Self-pay | Admitting: Internal Medicine

## 2017-11-27 ENCOUNTER — Ambulatory Visit (INDEPENDENT_AMBULATORY_CARE_PROVIDER_SITE_OTHER): Payer: Medicare Other | Admitting: Internal Medicine

## 2017-11-27 VITALS — BP 102/52 | HR 65 | Temp 98.3°F | Ht 59.0 in | Wt 177.8 lb

## 2017-11-27 DIAGNOSIS — L989 Disorder of the skin and subcutaneous tissue, unspecified: Secondary | ICD-10-CM

## 2017-11-27 DIAGNOSIS — G894 Chronic pain syndrome: Secondary | ICD-10-CM

## 2017-11-27 DIAGNOSIS — N183 Chronic kidney disease, stage 3 unspecified: Secondary | ICD-10-CM

## 2017-11-27 DIAGNOSIS — M109 Gout, unspecified: Secondary | ICD-10-CM

## 2017-11-27 MED ORDER — OXYCODONE-ACETAMINOPHEN 5-325 MG PO TABS: 1.0000 | ORAL_TABLET | Freq: Two times a day (BID) | ORAL | 0 refills | Status: AC | PRN

## 2017-11-27 MED ORDER — MUPIROCIN CALCIUM 2 % EX CREA: 1.0000 "application " | TOPICAL_CREAM | Freq: Two times a day (BID) | CUTANEOUS | 0 refills | Status: DC

## 2017-11-27 NOTE — Assessment & Plan Note (Signed)
-   Refilled percocet for an additional month as patient continues to be evaluated for acceptance to pain clinic. [place] Controlled Substance database reviewed and no red flags. Encouraged patient to obtain records from [facility] clinic in person if there are continued delays.

## 2017-11-27 NOTE — Patient Instructions (Addendum)
Mrs. Gahm,  I will call tomorrow with lab results. We'll check uric acid and kidney function today to decide if the colchicine and allopurinol need to be adjusted. I will refill these tomorrow. Stop prednisone once you are out of pills.  I refilled mupirocin cream for scabbed areas on back.  Best, Dr. Ola Spurr

## 2017-11-27 NOTE — Assessment & Plan Note (Addendum)
-   Symptoms improved. Will check uric acid level and kidney function and adjust allopurinol and colchicine (given recent initiation of allopurinol) as indicated.  - Recommended stopping prednisone (now only on 1 mg) when pills run out.

## 2017-11-27 NOTE — Progress Notes (Signed)
[name] Progress Note  Subjective:  Denise Freeman is a 67 y.o. 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 She reports improved left foot pain but continued right thigh pain. She denies rash. She has noted some wrist pain/spasms for the past 4 days and wonders if allopurinol could be the cause. She is only on colchicine 0.6 mg and allopurinol 50 mg daily due to reduced kidney function. She says she has established with Nephrology now for decreased kidney function and is awaiting further work-up. She denies stomach pains. She reports being able to get around much better compared to during initial flare. She has 1 week of prednisone left (continued for pain but no longer in acute flare).   In addition, patient is in process of transferring to another PCP and pain management clinic. She has not yet been accepted to [facility], as they are awaiting records from [facility] pain clinic. Patient reports talking to someone from [facility] pain clinic yesterday and was told about 40 pages of records were sent about her and her husband who was also referred.   Allergies  Allergen Reactions  . Xarelto [Rivaroxaban] Other (See Comments)    Internal bleeding  . Ancef [Cefazolin] Nausea And Vomiting  . Levaquin [Levofloxacin In D5w] Other (See Comments)    "afib"  . Lyrica [Pregabalin] Hives  . Tamiflu [Oseltamivir Phosphate] Other (See Comments)    "water blisters"  . Zoloft [Sertraline Hcl] Other (See Comments)    Jaw problems, jittery  . Augmentin [Amoxicillin-Pot Clavulanate] Itching  . Ciprofloxacin Itching and Nausea And Vomiting  . Haldol [Haloperidol] Other (See Comments)    Restless leg  . Nsaids Diarrhea   . Penicillins Itching, Nausea And Vomiting and Rash    Has patient had a PCN reaction causing immediate rash, facial/tongue/throat swelling, SOB or lightheadedness with hypotension: Yes Has patient had a PCN reaction causing severe rash involving mucus membranes or skin necrosis: No Has patient had a PCN reaction that required hospitalization No Has patient had a PCN reaction occurring within the last 10 years: Yes If all of the above answers are "NO", then may proceed with Cephalosporin use.  . Topamax [Topiramate] Nausea Only    Social History   Tobacco Use  . Smoking status: Former Smoker    Packs/day: 2.00    Years: 14.00    Pack years: 28.00    Types: Cigarettes    Last attempt to quit: 04/12/1989    Years since quitting: 28.6  . Smokeless tobacco: Never Used  Substance Use Topics  . Alcohol use: No    Alcohol/week: 0.0 oz    Objective: Blood pressure (!) 102/52, pulse 65, temperature 98.3 F (36.8 C), temperature source Oral, height 4\' 11"  (1.499 m), weight 177 lb 12.8 oz (80.6 kg), SpO2 99 %. Body mass index is 35.91 kg/m. Constitutional: Appears older than stated age.  HENT: Edentulous.  Musculoskeletal: Can palpate feet without pain. Minimal TTP over right greater trochanter.  Neurological: AOx3, LE strength 5/5.  Skin: Skin is warm and dry. Knees and feet without erythema or warmth. No rash noted but 3 small scabs on left upper back without surrounding erythema or drainage.  Psychiatric: Some perseverative speech but able to be redirected and agreeable  mood.  Vitals reviewed  Assessment/Plan: Gout - Symptoms improved. Will check uric acid level and kidney function and adjust allopurinol and colchicine (given recent initiation of allopurinol) as indicated.  - Recommended stopping prednisone (now only on 1 mg) when pills run out.   Chronic pain syndrome - Refilled percocet for an additional month as patient continues to be evaluated for acceptance to pain clinic.  [place] Controlled Substance database reviewed and no red flags. Encouraged patient to obtain records from [facility] clinic in person if there are continued delays.   Patient has follow-up with new PCP at clinic in [place] next month but was told she could not receive any pain medication from there.  [name], MD [place], PGY-3

## 2017-11-28 ENCOUNTER — Telehealth: Payer: Self-pay | Admitting: Internal Medicine

## 2017-11-28 LAB — BASIC METABOLIC PANEL
BUN/Creatinine Ratio: 27 (ref 12–28)
BUN: 37 mg/dL — ABNORMAL HIGH (ref 8–27)
CALCIUM: 9.4 mg/dL (ref 8.7–10.3)
CO2: 33 mmol/L — AB (ref 20–29)
Chloride: 85 mmol/L — ABNORMAL LOW (ref 96–106)
Creatinine, Ser: 1.37 mg/dL — ABNORMAL HIGH (ref 0.57–1.00)
GFR calc Af Amer: 46 mL/min/{1.73_m2} — ABNORMAL LOW (ref >59)
GFR calc non Af Amer: 40 mL/min/{1.73_m2} — ABNORMAL LOW (ref >59)
Glucose: 125 mg/dL — ABNORMAL HIGH (ref 65–99)
POTASSIUM: 3.7 mmol/L (ref 3.5–5.2)
Sodium: 136 mmol/L (ref 134–144)

## 2017-11-28 LAB — URIC ACID: URIC ACID: 7.5 mg/dL — AB (ref 2.5–7.1)

## 2017-11-28 MED ORDER — ALLOPURINOL 100 MG PO TABS: 100.0000 mg | ORAL_TABLET | Freq: Every day | ORAL | 0 refills | Status: DC

## 2017-11-28 MED ORDER — COLCHICINE 0.6 MG PO TABS
0.6000 mg | ORAL_TABLET | Freq: Every day | ORAL | 0 refills | Status: DC
Start: 2017-11-28 — End: 2018-09-16

## 2017-11-28 NOTE — Patient Instructions (Addendum)
Please call office w/ any questions/concerns

## 2017-11-28 NOTE — Telephone Encounter (Signed)
Left patient a message with her recent lab results. Will increase allopurinol to 100 mg daily given continued increased uric acid level. Also informed her of refused pain clinic referral through [facility]. Informed her she would need to pursue another referral through new PCP if she decides to no longer be our patient at [facility].   [name], MD [place], PGY-3

## 2017-11-28 NOTE — Progress Notes (Signed)
Pt brings in herFreestyle Libretoday.Shehas the 14day reader and sensors. Went over instructions on how to use the [place] system. Also went over instruction on how to properly apply the sensor to herarm. Libre sensor was placed on the inside of pts upperrightarm. Libre removed from L arm.  Shevoices understanding on how to use the system and how to get BG reading for insulin injections and as needed. Shewas advised to contact the office if he has any questions or concerns.

## 2017-12-02 ENCOUNTER — Other Ambulatory Visit: Payer: Self-pay | Admitting: Internal Medicine

## 2017-12-02 ENCOUNTER — Ambulatory Visit (INDEPENDENT_AMBULATORY_CARE_PROVIDER_SITE_OTHER): Payer: Medicare Other | Admitting: *Deleted

## 2017-12-02 ENCOUNTER — Telehealth: Payer: Self-pay | Admitting: Internal Medicine

## 2017-12-02 DIAGNOSIS — G894 Chronic pain syndrome: Secondary | ICD-10-CM | POA: Diagnosis not present

## 2017-12-02 DIAGNOSIS — J449 Chronic obstructive pulmonary disease, unspecified: Secondary | ICD-10-CM | POA: Diagnosis not present

## 2017-12-02 DIAGNOSIS — I4891 Unspecified atrial fibrillation: Secondary | ICD-10-CM | POA: Diagnosis not present

## 2017-12-02 DIAGNOSIS — K219 Gastro-esophageal reflux disease without esophagitis: Secondary | ICD-10-CM

## 2017-12-02 DIAGNOSIS — E1122 Type 2 diabetes mellitus with diabetic chronic kidney disease: Secondary | ICD-10-CM | POA: Diagnosis not present

## 2017-12-02 DIAGNOSIS — Z5181 Encounter for therapeutic drug level monitoring: Secondary | ICD-10-CM

## 2017-12-02 DIAGNOSIS — E1142 Type 2 diabetes mellitus with diabetic polyneuropathy: Secondary | ICD-10-CM | POA: Diagnosis not present

## 2017-12-02 DIAGNOSIS — I130 Hypertensive heart and chronic kidney disease with heart failure and stage 1 through stage 4 chronic kidney disease, or unspecified chronic kidney disease: Secondary | ICD-10-CM | POA: Diagnosis not present

## 2017-12-02 DIAGNOSIS — I5042 Chronic combined systolic (congestive) and diastolic (congestive) heart failure: Secondary | ICD-10-CM | POA: Diagnosis not present

## 2017-12-02 LAB — POCT INR: INR: 2.8 (ref 2.0–3.0)

## 2017-12-02 NOTE — Telephone Encounter (Signed)
Spoke with Judson Roch from [facility]. She is seeing patient for first time today and actually planned to do discharge teaching however patient is c/o being more SOB, bloating in abdomen and steadily increasing weight. Judson Roch states patient has non-pitting ankle edema, VSS, lungs clear but diminished. She is at 98% on 3L O2. Patient is compliant with diuretics (in blister-packs from pharmacy) but may need diet teaching. Her weights are noted below.   Judson Roch would like to know if MD would like to change any meds d/t weight gain and symptoms - if so, call her with instructions. She is asking to [facility] additional [facility] visits for symptom management, diet teaching etc - advised OK. Patient also cancelled her 10/20/17 appt with MD so she may need to f/up in office as well.    6/20 - 168lbs            175lbs            177lbs  7/2 -    179lbs

## 2017-12-02 NOTE — Patient Instructions (Signed)
Spoke with Judson Roch and advised pt to continue 1 tablet daily except 1 1/2 tablets on Saturdays Recheck in 1 week  Order given to [employer] [facility]

## 2017-12-02 NOTE — Telephone Encounter (Signed)
New Message:      Pt c/o swelling: STAT is pt has developed SOB within 24 hours  1) How much weight have you gained and in what time span? 5 lbs over a week  2) If swelling, where is the swelling located? stomach  3) Are you currently taking a fluid pill? yes  4) Are you currently SOB? yes  5) Do you have a log of your daily weights (if so, list)? [phone]  6) Have you gained 3 pounds in a day or 5 pounds in a week? 5 lb  7) Have you traveled recently? No   Judson Roch would like to know if we can consent for her to continue to do the visits and sign off on the visits for 9 more weeks for once a week.

## 2017-12-02 NOTE — Telephone Encounter (Signed)
Patient scheduled for OV 12/03/17 @ 945am

## 2017-12-02 NOTE — Telephone Encounter (Signed)
Needs office visit - ok to approve continued home visits.  Dr. Lemmie Evens

## 2017-12-03 ENCOUNTER — Ambulatory Visit (INDEPENDENT_AMBULATORY_CARE_PROVIDER_SITE_OTHER): Payer: Medicare Other | Admitting: Internal Medicine

## 2017-12-03 ENCOUNTER — Encounter: Payer: Self-pay | Admitting: Internal Medicine

## 2017-12-03 ENCOUNTER — Other Ambulatory Visit: Payer: Self-pay | Admitting: Internal Medicine

## 2017-12-03 VITALS — BP 120/58 | HR 60 | Ht 59.0 in | Wt 182.0 lb

## 2017-12-03 DIAGNOSIS — N183 Chronic kidney disease, stage 3 unspecified: Secondary | ICD-10-CM

## 2017-12-03 DIAGNOSIS — I428 Other cardiomyopathies: Secondary | ICD-10-CM | POA: Diagnosis not present

## 2017-12-03 DIAGNOSIS — M79605 Pain in left leg: Secondary | ICD-10-CM | POA: Diagnosis not present

## 2017-12-03 DIAGNOSIS — I5022 Chronic systolic (congestive) heart failure: Secondary | ICD-10-CM | POA: Diagnosis not present

## 2017-12-03 DIAGNOSIS — I4821 Permanent atrial fibrillation: Secondary | ICD-10-CM

## 2017-12-03 DIAGNOSIS — Z79899 Other long term (current) drug therapy: Secondary | ICD-10-CM | POA: Diagnosis not present

## 2017-12-03 DIAGNOSIS — M79604 Pain in right leg: Secondary | ICD-10-CM

## 2017-12-03 DIAGNOSIS — I482 Chronic atrial fibrillation: Secondary | ICD-10-CM

## 2017-12-03 LAB — BASIC METABOLIC PANEL
BUN / CREAT RATIO: 21 (ref 12–28)
BUN: 30 mg/dL — ABNORMAL HIGH (ref 8–27)
CO2: 28 mmol/L (ref 20–29)
Calcium: 8.5 mg/dL — ABNORMAL LOW (ref 8.7–10.3)
Chloride: 92 mmol/L — ABNORMAL LOW (ref 96–106)
Creatinine, Ser: 1.45 mg/dL — ABNORMAL HIGH (ref 0.57–1.00)
GFR calc Af Amer: 43 mL/min/{1.73_m2} — ABNORMAL LOW (ref >59)
GFR calc non Af Amer: 38 mL/min/{1.73_m2} — ABNORMAL LOW (ref >59)
GLUCOSE: 160 mg/dL — AB (ref 65–99)
POTASSIUM: 3.7 mmol/L (ref 3.5–5.2)
Sodium: 139 mmol/L (ref 134–144)

## 2017-12-03 LAB — PRO B NATRIURETIC PEPTIDE: NT-PRO BNP: 6297 pg/mL — AB (ref 0–301)

## 2017-12-03 NOTE — Progress Notes (Signed)
OFFICE NOTE  Chief Complaint:  Follow-up weight gain  Primary Care Physician: Steve Rattler, DO  HPI:  Denise Freeman is a pleasant 67 year old female who is establishing cardiac care today. She is accompanied by her husband and they recently moved here from [place], [place]. She has a history of severe COPD on home oxygen. She also has a history of stress-induced cardiomyopathy in the past. She has since had recovery of her EF. She's had numerous cardiac catheterizations. None of which showed obstructive coronary disease. Her last cardiac catheterization was in October 2014 which demonstrated no significant coronary disease. This was after a small abnormality was noted in the apex suggestive of ischemia on a nuclear stress test. She does have a history of permanent atrial fibrillation on Coumadin. She will need to have her INRs followed here. Unfortunate she has not had her INR checked in over 9 weeks and it was assessed today and was low. We will need to adjust her medication. She will be established in our anticoagulation clinic. Blood pressure looks well controlled today. She is on cholesterol medication.  I saw Denise Freeman back today in the office. Unfortunate she was hospitalized in December for worsening shortness of breath and probable pneumonia with a mild diastolic heart failure exacerbation. She was given IV diuretics and her Lasix was increased to 40 mg 3 times a day. She's also taking metolazone 3 times weekly. She continues to complain of leg edema which is mostly dependent. She has significant shortness of breath and CO2 retention and an appointment with a pulmonologist is pending.  Denise Freeman returned today back in the office. Her main complaints are a number of excoriations and weeping lesions on her face and arms. She apparently is going to see a dermatologist about this. She's also had worsening leg swelling and shortness of breath. Her weight is now up about 8 pounds since  her last visit. She was instructed to take Lasix 40 mg 3 times a day but apparently she is taking it twice a day. She is taking the metolazone 3 times a week. She is wearing compression stockings which I had advised and seems that this helps her swelling.  Denise Freeman returns today and reports a big improvement in her leg swelling. She's currently on Lasix 60 mg twice daily and metolazone 3x weekly. Weight is come down and swelling is improved. Her BNP was over 250 and is come down to about 125.  I saw Denise Freeman back today in follow-up. She's been successful losing over 15 pounds which I congratulated her on. Her breathing continues to be fairly difficult. She is using BiPAP at night. She's been in the ER a few times for mostly difficulty breathing and COPD exacerbation. She tells me the other day she was in church and had an episode of sharp chest pain in her left anterior chest and left scapular area. She was quite upset and emotional today time. Recently she's been the target of possibly a scam, so that she is under a lot of stress. She was upset that she did not have anything to take for her chest discomfort. I tried to reassure her that her coronary arteries have look normal with multiple catheterizations. She does have some diastolic dysfunction I suspect her LVEDP goes up when she gets upset causing her chest discomfort.  10/31/2015  Denise Freeman was seen today in follow-up from her recent hospitalization. She was seen in January for acute GI bleeding thought  to be upper GI bleeding and required transfusion for hemoglobin of 5. She was evaluated by Dr. Silvano Rusk with [facility] who recommended an EGD. Unfortunately, it looks like she was discharged the next day, possibly [place] after she removed her IV and stated she needed to go home. She relates that she never saw the specialist and does not recall seeing Dr. Carlean Purl. Since that time she's continued to have blood in her  stool. She was taken off of Xarelto and not restarted on anticoagulation. She does have a high CHADSVASC score of 5, suggesting a higher risk of stroke.  08/07/2016  Denise Freeman returns for follow-up. She underwent GI work-up with no clear bleeding source. She does not want to be on anticoagulation for fear of another stroke. She attributes her vision loss and word-finding difficulty to her prior stroke on Xarelto. She was previously on warfarin in the distant past, but reported having difficulty maintaining a therapeutic INR. We discussed the possibility of a Watchman device today - she seems interested in this. I explained the procedure and will refer her to Dr. Rayann Heman for evaluation.  5/30/20181  Denise Freeman was seen today in follow-up. She recently saw Ignacia Bayley, NP, after hospital follow-up. Given her recurrent  cardiomyopathy with EF 30-35%, he recommended starting her on BiDil. This is based on recent lab work demonstrating significant renal insufficiency however repeat lab work shows normal creatinine. She recently has had 10 pound weight gain and feels that it is related to a decrease in her metolazone to a half tablet daily. Previously had had her on 80 mg Lasix twice a day and metolazone 2.5 mg daily.  12/09/2016  Denise Freeman returns today for follow-up. She reports some improvement in her fatigue and breathing. I placed her on both metolazone as well as a losartan. Today her blood pressures noted to be low at 90/50 and she is down about 6 pounds with a diuretic. Recently lab work indicated a small increase in her creatinine up to 1.35 with a baseline of about 1.0-1.1. She says a diuretic is doing good job of keeping her swelling off, however I wonder she may be a little bit over diuresis. In addition she may not be tolerating the ARB.  03/12/2017  Denise Freeman was seen today in follow-up. Her weight is now up about 13 pounds. She denies any worsening swelling. She thinks that it may be due to  eating her large meals at night. Her blood sugars have not been well controlled and apparently there is been some therapeutic disagreements between her an endocrinologist, she is been referred to another endocrinologist in [place]. She remains in permanent A. fib which is rate controlled. She's currently taking metolazone daily in addition to Lasix 80 mg twice a day. She denies any worsening shortness of breath or chest pain. She remains on home oxygen for COPD which is severe.   06/13/2017  Denise Freeman returns today for follow-up. She has had recurrent edema. See telephone notes for details. Weight was up 10 lbs. Her lasix was increased to 80 mg TID in addition to her 2.5 mg metolazone daily - apparently, her pharmacy is packaging this for her to take at night, but it would be better in the morning. Recently labs indicate creatinine of 1.6, therefore, we had to reduce her lasix. Weight today is 181 which is back at her dry weight. She also reports stopping her lipitor 2 weeks ago because of memory problems which she says is  better.  12/03/2017  Denise Freeman returns today for follow-up.  We were contacted by her advanced home care nurse who noted that she has gained significant weight.  In June 2019 weight was 173 then up to 177 and now 182 about a month later.  She reports increasing abdominal girth but denies any lower extremity edema.  She is short of breath but it is consistent with her home shortness of breath.  She is also been on steroids for the past 2 months for gout.  She finished her last dose today and is transitioning over to allopurinol and colchicine.  In addition she has had worsening renal function.  She is scheduled for renal ultrasound in June and has a follow-up appointment with Dr. Lowanda Foster in [place] in July.  She is also complaining of bilateral leg pain.  She says it is somewhat worse when she walks and improved at rest.  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  PMHx:  Past Medical History:  Diagnosis Date  . Allergy   . Anemia   . Anxiety   . Asthma   . Atrial fibrillation ([place])   . Bipolar 1 disorder ([place])   . 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 Cholelithiasis    a. 09/2016 s/p Lap Chole.  . Chronic bronchitis ([place])   . Chronic combined systolic and diastolic congestive heart failure ([place])    a. 09/2016 Echo: EF 30-35%.  . Colon polyps   . COPD (chronic obstructive pulmonary disease) ([place])   . Depression   . Family history of adverse reaction to anesthesia    Uncle was positive for malignant hyperthermia; patient had testing done and was negative.  . Fibromyalgia   . GERD (gastroesophageal reflux disease)   . Gout   . High cholesterol   . History of blood transfusion 06/2015   "bleeding from my rectum"  . History of hiatal hernia   . HOH (hard of hearing)   . Hx of colonic polyps 03/21/2016   3 small adenomas no recall - co-morbidities  . Neuropathy    Disc Back   . NICM (nonischemic cardiomyopathy) ([place])    a. Previously worked up in [place], [place]-->low EF with subsequent recovery.  Multiple caths (last ~ 2014 per pt report)--reportedly nl cors;  b. 09/2016 Echo: EF 30-35%, antsept/apical HK, mild MR, mildly dil LA, mod dil RA;  c. 09/2016 Lexi MV: EF 26%, glob HK, sept DK, med size, mod intensity fixed septal defect - BBB/PVC related artifact, no ischemia.  . On home oxygen therapy    "3L; 24/7" (03/14/2016)  . OSA treated with BiPAP    uses biPAP, 10 (03/14/2016)  . Osteoarthritis   . Oxygen deficiency   . Pneumonia   . Type II diabetes mellitus ([place])     Past Surgical History:  Procedure Laterality Date  . APPENDECTOMY     "they busted"  . bladder stimulator     pt states, "it cannot be turned off; it's in my right hip; dead battery so it's not working anymore". (03/14/2016)  . BLADDER SUSPENSION     2003, 2006 and 2010  . CATARACT  EXTRACTION W/PHACO Right 11/29/2014   Procedure: CATARACT EXTRACTION PHACO AND INTRAOCULAR LENS PLACEMENT (IOC);  Surgeon: Rutherford Guys, MD;  Location: [facility];  Service: Ophthalmology;  Laterality: Right;  CDE:3.81  . CATARACT EXTRACTION W/PHACO Left 12/13/2014  Procedure: CATARACT EXTRACTION PHACO AND INTRAOCULAR LENS PLACEMENT (IOC);  Surgeon: Rutherford Guys, MD;  Location: [facility];  Service: Ophthalmology;  Laterality: Left;  CDE:6.59  . CERVICAL DISC SURGERY N/A 2009   4, 6, and 7 cervical disc replaced  . CHOLECYSTECTOMY N/A 09/13/2016   Procedure: LAPAROSCOPIC CHOLECYSTECTOMY;  Surgeon: Rolm Bookbinder, MD;  Location: [facility];  Service: General;  Laterality: N/A;  . COLONOSCOPY WITH PROPOFOL N/A 03/15/2016   Procedure: COLONOSCOPY WITH PROPOFOL;  Surgeon: Gatha Mayer, MD;  Location: [facility];  Service: Endoscopy;  Laterality: N/A;  . ESOPHAGOGASTRODUODENOSCOPY (EGD) WITH PROPOFOL N/A 03/15/2016   Procedure: ESOPHAGOGASTRODUODENOSCOPY (EGD) WITH PROPOFOL;  Surgeon: Gatha Mayer, MD;  Location: [facility];  Service: Endoscopy;  Laterality: N/A;  . HEEL SPUR SURGERY Bilateral   . HERNIA REPAIR    . I&D EXTREMITY Right 06/13/2015   Procedure: MINOR IRRIGATION AND DEBRIDEMENT EXTREMITY REMOVAL OF NAIL;  Surgeon: Daryll Brod, MD;  Location: [facility];  Service: Orthopedics;  Laterality: Right;  . TUBAL LIGATION    . UMBILICAL HERNIA REPAIR     w/mesh    FAMHx:  Family History  Problem Relation Age of Onset  . Heart disease Mother   . COPD Mother   . Diabetes Mother   . Breast cancer Mother   . Heart disease Father   . Hyperlipidemia Father   . COPD Sister   . Heart disease Sister   . Diabetes Sister   . Heart disease Maternal Grandmother   . Cancer Maternal Grandmother        stomach  . Cancer Maternal Grandfather        lung   . Bipolar disorder Brother     SOCHx:   reports that she quit smoking about 28 years ago. Her smoking use included cigarettes.  She has a 28.00 pack-year smoking history. She has never used smokeless tobacco. She reports that she does not drink alcohol or use drugs.  ALLERGIES:  Allergies  Allergen Reactions  . Xarelto [Rivaroxaban] Other (See Comments)    Internal bleeding  . Ancef [Cefazolin] Nausea And Vomiting  . Levaquin [Levofloxacin In D5w] Other (See Comments)    "afib"  . Lyrica [Pregabalin] Hives  . Tamiflu [Oseltamivir Phosphate] Other (See Comments)    "water blisters"  . Zoloft [Sertraline Hcl] Other (See Comments)    Jaw problems, jittery  . Augmentin [Amoxicillin-Pot Clavulanate] Itching  . Ciprofloxacin Itching and Nausea And Vomiting  . Haldol [Haloperidol] Other (See Comments)    Restless leg  . Nsaids Diarrhea  . Penicillins Itching, Nausea And Vomiting and Rash    Has patient had a PCN reaction causing immediate rash, facial/tongue/throat swelling, SOB or lightheadedness with hypotension: Yes Has patient had a PCN reaction causing severe rash involving mucus membranes or skin necrosis: No Has patient had a PCN reaction that required hospitalization No Has patient had a PCN reaction occurring within the last 10 years: Yes If all of the above answers are "NO", then may proceed with Cephalosporin use.  . Topamax [Topiramate] Nausea Only    ROS: Pertinent items noted in HPI and remainder of comprehensive ROS otherwise negative.  HOME MEDS: Current Outpatient Medications  Medication Sig Dispense Refill  . albuterol (PROVENTIL) (2.5 MG/3ML) 0.083% nebulizer solution USE (1) IN NEBULIZER EVERY 6 HOURS AS NEEDED FOR SHORTNESS OF BREATH. 75 mL 2  . allopurinol (ZYLOPRIM) 100 MG tablet Take 1 tablet (100 mg total) by mouth daily. 30 tablet 0  . cetirizine (  ZYRTEC) 10 MG tablet Take 1 tablet (10 mg total) by mouth at bedtime. 30 tablet 11  . cholecalciferol (VITAMIN D) 400 units TABS tablet Take 1 tablet (400 Units total) by mouth 2 (two) times daily. 180 each 3  . colchicine 0.6 MG tablet Take  1 tablet (0.6 mg total) by mouth daily. 30 tablet 1  . colchicine 0.6 MG tablet Take 1 tablet (0.6 mg total) by mouth daily. If have stomach upset, can reduce to every other day. 30 tablet 0  . docusate sodium (COLACE) 100 MG capsule Take 100 mg by mouth daily.    Marland Kitchen escitalopram (LEXAPRO) 10 MG tablet Take 1 tablet (10 mg total) by mouth at bedtime. 90 tablet 0  . fenofibrate 54 MG tablet TAKE 1 TABLET BY MOUTH ONCE DAILY. 28 tablet 3  . FEROSUL 325 (65 Fe) MG tablet TAKE ONE TABLET BY MOUTH AT BEDTIME. 28 tablet 11  . ferrous sulfate 324 (65 Fe) MG TBEC Take 1 tablet by mouth daily.    . furosemide (LASIX) 40 MG tablet Take 2 tablets (80 mg total) by mouth 2 (two) times daily. 120 tablet 9  . Insulin Glargine (BASAGLAR KWIKPEN) 100 UNIT/ML SOPN Inject 0.6 mLs (60 Units total) into the skin at bedtime. 15 mL 2  . insulin lispro (HUMALOG KWIKPEN) 100 UNIT/ML KiwkPen Inject 0.1-0.16 mLs (10-16 Units total) into the skin 3 (three) times daily before meals. 15 mL 2  . Insulin Pen Needle 32G X 4 MM MISC Use to inject insulin 5 times daily 500 each 5  . ipratropium (ATROVENT) 0.02 % nebulizer solution USE (1) IN NEBULIZER EVERY 6 HOURS AS NEEDED FOR SHORTNESS OF BREATH. 75 mL 2  . losartan (COZAAR) 25 MG tablet Take 0.5 tablets (12.5 mg total) by mouth daily. 45 tablet 2  . metolazone (ZAROXOLYN) 2.5 MG tablet Take 1 tablet (2.5 mg total) by mouth daily. Take 30 minutes prior to morning LASIX 90 tablet 3  . metoprolol succinate (TOPROL-XL) 25 MG 24 hr tablet TAKE 1 TABLET BY MOUTH TWICE DAILY 56 tablet 5  . mirtazapine (REMERON) 15 MG tablet Take 1 tablet (15 mg total) by mouth at bedtime. 90 tablet 0  . modafinil (PROVIGIL) 200 MG tablet Take 1 tablet (200 mg total) by mouth daily. 30 tablet 5  . mometasone (NASONEX) 50 MCG/ACT nasal spray Place 1 spray into the nose daily. 17 g 11  . montelukast (SINGULAIR) 10 MG tablet TAKE ONE TABLET BY MOUTH AT BEDTIME. 28 tablet 2  . mupirocin cream (BACTROBAN) 2  % Apply 1 application topically 2 (two) times daily. 15 g 0  . nitroGLYCERIN (NITROSTAT) 0.4 MG SL tablet Place 1 tablet (0.4 mg total) under the tongue every 5 (five) minutes as needed for chest pain. 25 tablet 3  . nystatin (NYSTATIN) powder Apply topically 2 (two) times daily. Apply under breasts 15 g 3  . oxyCODONE-acetaminophen (PERCOCET/ROXICET) 5-325 MG tablet Take 1 tablet by mouth 2 (two) times daily as needed for severe pain. 60 tablet 0  . OXYGEN Inhale 3 L into the lungs continuous. 3 liters 24/7    . pantoprazole (PROTONIX) 20 MG tablet TAKE (1) TABLET BY MOUTH TWICE A DAY BEFORE MEALS. (BREAKFAST AND SUPPER) 56 tablet 0  . predniSONE (DELTASONE) 5 MG tablet Take 1 tablet (5 mg total) by mouth daily with breakfast. 30 tablet 2  . PRESCRIPTION MEDICATION Inhale into the lungs See admin instructions. Use BIPAP every time laying down    . primidone (  MYSOLINE) 50 MG tablet TAKE ONE TABLET BY MOUTH AT BEDTIME. 30 tablet 5  . Probiotic Product (PROBIOTIC PO) Take 1 tablet by mouth at bedtime.    . triamcinolone lotion (KENALOG) 0.1 % Apply 1 application topically 3 (three) times daily. For up to 14 days and as needed 60 mL 1  . TRUE METRIX BLOOD GLUCOSE TEST test strip USE TO TEST BLOOD SUGAR AS DIRECTED. 50 each 2  . vitamin A 10000 UNIT capsule Take 10,000 Units by mouth every morning.    . warfarin (COUMADIN) 4 MG tablet Take 1 tablet daily except 1 1/2 tablets on Saturdays 35 tablet 4   No current facility-administered medications for this visit.     LABS/IMAGING: Results for orders placed or performed in visit on 12/02/17 (from the past 48 hour(s))  POCT INR     Status: None   Collection Time: 12/02/17 12:00 AM  Result Value Ref Range   INR 2.8 2.0 - 3.0   No results found.  VITALS: BP (!) 120/58 (BP Location: Left Arm, Patient Position: Sitting, Cuff Size: Large)   Pulse 60   Ht 4\' 11"  (1.499 m)   Wt 182 lb (82.6 kg)   BMI 36.76 kg/m   EXAM: General appearance: alert,  appears older than stated age, no distress, morbidly obese and On chronic nasal cannula oxygen Neck: no carotid bruit and no JVD Lungs: diminished breath sounds bilaterally Heart: irregularly irregular rhythm Abdomen: soft, non-tender; bowel sounds normal; no masses,  no organomegaly Extremities: extremities normal, atraumatic, no cyanosis or edema and varicose veins noted Pulses: 1+ DP/PT pulses Skin: Skin color, texture, turgor normal. No rashes or lesions Neurologic: Grossly normal Psych: Pleasant  EKG: A. fib with low voltage QRS and poor R wave progression at 60-personally reviewed  ASSESSMENT: 1. Permanent atrial fibrillation - not on anticoagulation due to recent GI bleeding and vision loss attributed to stroke 2. Acute on Chronic combined congestive heart failure - EF 30-35% 3. History of stress-induced cardiomyopathy 4. No significant obstructive coronary disease after multiple catheterizations in 1996, 99, 2007 and 2014. 5. Fibromyalgia 6. Bipolar 1 disorder 7. Dyslipidemia 8. Hypertension 9. Severe COPD on home oxygen 10. Obstructive sleep apnea on CPAP 11. Morbid obesity - with weight loss recently 12. DNR  PLAN: 1.   Denise Freeman has had about a 10 pound weight gain but over the past couple of months.  She is been on steroids during this time which I think is playing a role in this although she denies any increase in her appetite.  She does not have any significant worsening edema.  Blood pressure is well controlled today.  I like to check a metabolic profile and BNP to make sure she is not volume overloaded.  He is ready on high-dose diuretics with Lasix 80 twice daily and metolazone and has significant chronic kidney disease.  She is scheduled for renal ultrasound and a renal consult later this month.  If there is evidence of volume overload I may adjust her diuretics further, otherwise will continue her current medications.  We will plan lower extremity arterial Dopplers  due to her leg pain with walking to rule out any PAD.  Follow-up with me in 3 months.  Pixie Casino, MD, [facility], [place] Director of the Advanced Lipid Disorders &  Cardiovascular Risk Reduction Clinic Diplomate of the American Board of Clinical Lipidology Attending Cardiologist  Direct Dial: [phone]  Fax: [phone]  Website:  [email or web address] 12/03/2017, 9:33 AM

## 2017-12-03 NOTE — Patient Instructions (Signed)
Medication Instructions:   Continue current medications  Labwork:  Your physician recommends that you return for lab work TODAY   Testing/Procedures:  Your physician has requested that you have a lower arterial duplex. This test is an ultrasound of the arteries in the legs. It looks at arterial blood flow in the legs. Allow one hour for Lower Arterial scans. There are no restrictions or special instructions  Follow-Up:  Your physician recommends that you schedule a follow-up appointment in: [place] with [name]   If you need a refill on your cardiac medications before your next appointment, please call your pharmacy.  Any Other Special Instructions Will Be Listed Below (If Applicable).

## 2017-12-05 ENCOUNTER — Telehealth: Payer: Self-pay | Admitting: Internal Medicine

## 2017-12-05 MED ORDER — FUROSEMIDE 40 MG PO TABS: 80.0000 mg | ORAL_TABLET | Freq: Three times a day (TID) | ORAL | 5 refills | Status: DC

## 2017-12-05 NOTE — Telephone Encounter (Signed)
Patient called w/MD recommendations. She agreed w/plan. Rx(s) sent to pharmacy electronically.

## 2017-12-05 NOTE — Telephone Encounter (Signed)
-----   Message from Pixie Casino, MD sent at 12/03/2017  5:27 PM EDT ----- BNP much higher - recommend increase lasix to 80 mg TID and continue metolazone 2.5 mg daily. Monitor weight. Has follow-up with nephrology later this month.  Dr. Lemmie Evens

## 2017-12-06 ENCOUNTER — Other Ambulatory Visit: Payer: Self-pay | Admitting: Internal Medicine

## 2017-12-09 ENCOUNTER — Ambulatory Visit (HOSPITAL_COMMUNITY)
Admission: RE | Admit: 2017-12-09 | Discharge: 2017-12-09 | Disposition: A | Discharge status: Home / Self Care | Payer: Medicare Other | Source: Ambulatory Visit | Attending: Nephrology | Admitting: Nephrology

## 2017-12-09 ENCOUNTER — Ambulatory Visit (INDEPENDENT_AMBULATORY_CARE_PROVIDER_SITE_OTHER): Payer: Medicare Other | Admitting: *Deleted

## 2017-12-09 ENCOUNTER — Telehealth: Payer: Self-pay | Admitting: Internal Medicine

## 2017-12-09 DIAGNOSIS — N183 Chronic kidney disease, stage 3 unspecified: Secondary | ICD-10-CM

## 2017-12-09 DIAGNOSIS — I5042 Chronic combined systolic (congestive) and diastolic (congestive) heart failure: Secondary | ICD-10-CM | POA: Diagnosis not present

## 2017-12-09 DIAGNOSIS — I4891 Unspecified atrial fibrillation: Secondary | ICD-10-CM | POA: Diagnosis not present

## 2017-12-09 DIAGNOSIS — J449 Chronic obstructive pulmonary disease, unspecified: Secondary | ICD-10-CM | POA: Diagnosis not present

## 2017-12-09 DIAGNOSIS — Z5181 Encounter for therapeutic drug level monitoring: Secondary | ICD-10-CM

## 2017-12-09 DIAGNOSIS — I130 Hypertensive heart and chronic kidney disease with heart failure and stage 1 through stage 4 chronic kidney disease, or unspecified chronic kidney disease: Secondary | ICD-10-CM | POA: Diagnosis not present

## 2017-12-09 DIAGNOSIS — D509 Iron deficiency anemia, unspecified: Secondary | ICD-10-CM | POA: Diagnosis not present

## 2017-12-09 DIAGNOSIS — D519 Vitamin B12 deficiency anemia, unspecified: Secondary | ICD-10-CM | POA: Diagnosis not present

## 2017-12-09 DIAGNOSIS — E559 Vitamin D deficiency, unspecified: Secondary | ICD-10-CM | POA: Diagnosis not present

## 2017-12-09 DIAGNOSIS — E1122 Type 2 diabetes mellitus with diabetic chronic kidney disease: Secondary | ICD-10-CM | POA: Diagnosis not present

## 2017-12-09 DIAGNOSIS — Z1159 Encounter for screening for other viral diseases: Secondary | ICD-10-CM | POA: Diagnosis not present

## 2017-12-09 DIAGNOSIS — R809 Proteinuria, unspecified: Secondary | ICD-10-CM | POA: Diagnosis not present

## 2017-12-09 DIAGNOSIS — G894 Chronic pain syndrome: Secondary | ICD-10-CM | POA: Diagnosis not present

## 2017-12-09 DIAGNOSIS — Z79899 Other long term (current) drug therapy: Secondary | ICD-10-CM | POA: Diagnosis not present

## 2017-12-09 DIAGNOSIS — I10 Essential (primary) hypertension: Secondary | ICD-10-CM | POA: Diagnosis not present

## 2017-12-09 DIAGNOSIS — E1142 Type 2 diabetes mellitus with diabetic polyneuropathy: Secondary | ICD-10-CM | POA: Diagnosis not present

## 2017-12-09 LAB — POCT INR: INR: 3.2 — AB (ref 2.0–3.0)

## 2017-12-09 NOTE — Patient Instructions (Signed)
Take 2 mg tonight then decrease dose to 4mg  daily. Pt has started allopurinol daily Recheck in 1 week  Order given to [employer] [facility]

## 2017-12-09 NOTE — Telephone Encounter (Signed)
INR 3.2

## 2017-12-09 NOTE — Telephone Encounter (Signed)
Done.  See coumadin note. 

## 2017-12-10 ENCOUNTER — Ambulatory Visit (INDEPENDENT_AMBULATORY_CARE_PROVIDER_SITE_OTHER): Payer: Medicare Other

## 2017-12-10 VITALS — Ht 59.0 in | Wt 182.0 lb

## 2017-12-10 DIAGNOSIS — M1991 Primary osteoarthritis, unspecified site: Secondary | ICD-10-CM | POA: Diagnosis not present

## 2017-12-10 DIAGNOSIS — M109 Gout, unspecified: Secondary | ICD-10-CM | POA: Diagnosis not present

## 2017-12-10 DIAGNOSIS — I5042 Chronic combined systolic (congestive) and diastolic (congestive) heart failure: Secondary | ICD-10-CM | POA: Diagnosis not present

## 2017-12-10 DIAGNOSIS — J449 Chronic obstructive pulmonary disease, unspecified: Secondary | ICD-10-CM | POA: Diagnosis not present

## 2017-12-10 DIAGNOSIS — Z87891 Personal history of nicotine dependence: Secondary | ICD-10-CM | POA: Diagnosis not present

## 2017-12-10 DIAGNOSIS — R251 Tremor, unspecified: Secondary | ICD-10-CM | POA: Diagnosis not present

## 2017-12-10 DIAGNOSIS — Z79891 Long term (current) use of opiate analgesic: Secondary | ICD-10-CM | POA: Diagnosis not present

## 2017-12-10 DIAGNOSIS — Z5181 Encounter for therapeutic drug level monitoring: Secondary | ICD-10-CM | POA: Diagnosis not present

## 2017-12-10 DIAGNOSIS — G4733 Obstructive sleep apnea (adult) (pediatric): Secondary | ICD-10-CM | POA: Diagnosis not present

## 2017-12-10 DIAGNOSIS — I130 Hypertensive heart and chronic kidney disease with heart failure and stage 1 through stage 4 chronic kidney disease, or unspecified chronic kidney disease: Secondary | ICD-10-CM | POA: Diagnosis not present

## 2017-12-10 DIAGNOSIS — M545 Low back pain: Secondary | ICD-10-CM | POA: Diagnosis not present

## 2017-12-10 DIAGNOSIS — Z7901 Long term (current) use of anticoagulants: Secondary | ICD-10-CM | POA: Diagnosis not present

## 2017-12-10 DIAGNOSIS — E1165 Type 2 diabetes mellitus with hyperglycemia: Secondary | ICD-10-CM | POA: Diagnosis not present

## 2017-12-10 DIAGNOSIS — K5900 Constipation, unspecified: Secondary | ICD-10-CM | POA: Diagnosis not present

## 2017-12-10 DIAGNOSIS — I428 Other cardiomyopathies: Secondary | ICD-10-CM | POA: Diagnosis not present

## 2017-12-10 DIAGNOSIS — M79604 Pain in right leg: Secondary | ICD-10-CM | POA: Diagnosis not present

## 2017-12-10 DIAGNOSIS — Z9981 Dependence on supplemental oxygen: Secondary | ICD-10-CM | POA: Diagnosis not present

## 2017-12-10 DIAGNOSIS — H9190 Unspecified hearing loss, unspecified ear: Secondary | ICD-10-CM | POA: Diagnosis not present

## 2017-12-10 DIAGNOSIS — N183 Chronic kidney disease, stage 3 (moderate): Secondary | ICD-10-CM | POA: Diagnosis not present

## 2017-12-10 DIAGNOSIS — E1142 Type 2 diabetes mellitus with diabetic polyneuropathy: Secondary | ICD-10-CM | POA: Diagnosis not present

## 2017-12-10 DIAGNOSIS — H547 Unspecified visual loss: Secondary | ICD-10-CM | POA: Diagnosis not present

## 2017-12-10 DIAGNOSIS — G894 Chronic pain syndrome: Secondary | ICD-10-CM | POA: Diagnosis not present

## 2017-12-10 DIAGNOSIS — E1151 Type 2 diabetes mellitus with diabetic peripheral angiopathy without gangrene: Secondary | ICD-10-CM | POA: Diagnosis not present

## 2017-12-10 DIAGNOSIS — E782 Mixed hyperlipidemia: Secondary | ICD-10-CM | POA: Diagnosis not present

## 2017-12-10 DIAGNOSIS — F319 Bipolar disorder, unspecified: Secondary | ICD-10-CM | POA: Diagnosis not present

## 2017-12-10 DIAGNOSIS — N189 Chronic kidney disease, unspecified: Secondary | ICD-10-CM | POA: Diagnosis not present

## 2017-12-10 DIAGNOSIS — G2581 Restless legs syndrome: Secondary | ICD-10-CM | POA: Diagnosis not present

## 2017-12-10 DIAGNOSIS — K219 Gastro-esophageal reflux disease without esophagitis: Secondary | ICD-10-CM | POA: Diagnosis not present

## 2017-12-10 DIAGNOSIS — I482 Chronic atrial fibrillation: Secondary | ICD-10-CM | POA: Diagnosis not present

## 2017-12-10 DIAGNOSIS — Z794 Long term (current) use of insulin: Secondary | ICD-10-CM | POA: Diagnosis not present

## 2017-12-10 DIAGNOSIS — E1122 Type 2 diabetes mellitus with diabetic chronic kidney disease: Secondary | ICD-10-CM | POA: Diagnosis not present

## 2017-12-10 DIAGNOSIS — M797 Fibromyalgia: Secondary | ICD-10-CM | POA: Diagnosis not present

## 2017-12-10 NOTE — Patient Instructions (Signed)
Pt advised to call with any questions/concerns

## 2017-12-10 NOTE — Progress Notes (Signed)
Pt brings in herFreestyle Libretoday.Shehas the 14day reader and sensors. Went over instructions on how to use the [place] system. Also went over instruction on how to properly apply the sensor to herarm. Libre sensor was placed on theinsideof pts upperrightarm.Libre removed from R arm.Pt has issues replacing the [place] sensor every 14 days due to vision problems. Shevoices understanding on how to use the system and how to get BG reading for insulin injections and as needed. Shewas advised to contact the office if he has any questions or concerns.

## 2017-12-15 ENCOUNTER — Ambulatory Visit (HOSPITAL_COMMUNITY)
Admission: RE | Admit: 2017-12-15 | Discharge: 2017-12-15 | Disposition: A | Discharge status: Home / Self Care | Payer: Medicare Other | Source: Ambulatory Visit | Attending: Cardiovascular Disease | Admitting: Cardiovascular Disease

## 2017-12-15 DIAGNOSIS — M79605 Pain in left leg: Secondary | ICD-10-CM | POA: Diagnosis not present

## 2017-12-15 DIAGNOSIS — M79604 Pain in right leg: Secondary | ICD-10-CM | POA: Diagnosis not present

## 2017-12-16 ENCOUNTER — Ambulatory Visit (INDEPENDENT_AMBULATORY_CARE_PROVIDER_SITE_OTHER): Payer: Medicare Other | Admitting: Cardiovascular Disease

## 2017-12-16 DIAGNOSIS — E1151 Type 2 diabetes mellitus with diabetic peripheral angiopathy without gangrene: Secondary | ICD-10-CM | POA: Diagnosis not present

## 2017-12-16 DIAGNOSIS — I4891 Unspecified atrial fibrillation: Secondary | ICD-10-CM

## 2017-12-16 DIAGNOSIS — J449 Chronic obstructive pulmonary disease, unspecified: Secondary | ICD-10-CM | POA: Diagnosis not present

## 2017-12-16 DIAGNOSIS — E1122 Type 2 diabetes mellitus with diabetic chronic kidney disease: Secondary | ICD-10-CM | POA: Diagnosis not present

## 2017-12-16 DIAGNOSIS — G894 Chronic pain syndrome: Secondary | ICD-10-CM | POA: Diagnosis not present

## 2017-12-16 DIAGNOSIS — I130 Hypertensive heart and chronic kidney disease with heart failure and stage 1 through stage 4 chronic kidney disease, or unspecified chronic kidney disease: Secondary | ICD-10-CM | POA: Diagnosis not present

## 2017-12-16 DIAGNOSIS — Z5181 Encounter for therapeutic drug level monitoring: Secondary | ICD-10-CM | POA: Diagnosis not present

## 2017-12-16 DIAGNOSIS — I5042 Chronic combined systolic (congestive) and diastolic (congestive) heart failure: Secondary | ICD-10-CM | POA: Diagnosis not present

## 2017-12-16 LAB — POCT INR: INR: 4.4 — AB (ref 2.0–3.0)

## 2017-12-16 NOTE — Patient Instructions (Signed)
Description   Spoke with pt and instructed her to hold coumadin today July 16th and no coumadin on July 17th then take dose as instructed on last encounter  Coumadin 4mg    daily. Pt has started allopurinol daily Recheck in 1 week  Order given to [facility]

## 2017-12-19 ENCOUNTER — Telehealth: Payer: Self-pay | Admitting: Internal Medicine

## 2017-12-19 DIAGNOSIS — N183 Chronic kidney disease, stage 3 (moderate): Secondary | ICD-10-CM | POA: Diagnosis not present

## 2017-12-19 DIAGNOSIS — D638 Anemia in other chronic diseases classified elsewhere: Secondary | ICD-10-CM | POA: Diagnosis not present

## 2017-12-19 DIAGNOSIS — E559 Vitamin D deficiency, unspecified: Secondary | ICD-10-CM | POA: Diagnosis not present

## 2017-12-19 DIAGNOSIS — R809 Proteinuria, unspecified: Secondary | ICD-10-CM | POA: Diagnosis not present

## 2017-12-19 NOTE — Telephone Encounter (Signed)
New Message      Patient returned your call, pls call again.

## 2017-12-22 ENCOUNTER — Telehealth: Payer: Self-pay | Admitting: *Deleted

## 2017-12-22 ENCOUNTER — Ambulatory Visit (INDEPENDENT_AMBULATORY_CARE_PROVIDER_SITE_OTHER): Payer: Medicare Other | Admitting: *Deleted

## 2017-12-22 DIAGNOSIS — Z5181 Encounter for therapeutic drug level monitoring: Secondary | ICD-10-CM | POA: Diagnosis not present

## 2017-12-22 DIAGNOSIS — J449 Chronic obstructive pulmonary disease, unspecified: Secondary | ICD-10-CM | POA: Diagnosis not present

## 2017-12-22 DIAGNOSIS — G894 Chronic pain syndrome: Secondary | ICD-10-CM | POA: Diagnosis not present

## 2017-12-22 DIAGNOSIS — E1151 Type 2 diabetes mellitus with diabetic peripheral angiopathy without gangrene: Secondary | ICD-10-CM | POA: Diagnosis not present

## 2017-12-22 DIAGNOSIS — I4891 Unspecified atrial fibrillation: Secondary | ICD-10-CM | POA: Diagnosis not present

## 2017-12-22 DIAGNOSIS — I130 Hypertensive heart and chronic kidney disease with heart failure and stage 1 through stage 4 chronic kidney disease, or unspecified chronic kidney disease: Secondary | ICD-10-CM | POA: Diagnosis not present

## 2017-12-22 DIAGNOSIS — I5042 Chronic combined systolic (congestive) and diastolic (congestive) heart failure: Secondary | ICD-10-CM | POA: Diagnosis not present

## 2017-12-22 DIAGNOSIS — E1122 Type 2 diabetes mellitus with diabetic chronic kidney disease: Secondary | ICD-10-CM | POA: Diagnosis not present

## 2017-12-22 LAB — POCT INR: INR: 1.2 — AB (ref 2.0–3.0)

## 2017-12-22 NOTE — Telephone Encounter (Signed)
Home health nurse left a voice mail  (she did not leave her name)  1.2  14.9  [facility] can be reached @ [phone]-please give her a call

## 2017-12-22 NOTE — Patient Instructions (Signed)
Take coumadin 6mg  tonight and tomorrow night then resume 4mg  daily Recheck in 1 week  Order given to [facility]

## 2017-12-22 NOTE — Telephone Encounter (Signed)
Done.  See coumadin note. 

## 2017-12-23 NOTE — Telephone Encounter (Signed)
Notes recorded by Pixie Casino, MD on 12/17/2017 at 10:38 AM EDT Essentially normal LE arterial dopplers.  Dr. Lemmie Evens  Patient aware and verbalized understanding.

## 2017-12-23 NOTE — Telephone Encounter (Signed)
New Message   Pt returning call for nurse regarding test results. Please call

## 2017-12-26 ENCOUNTER — Other Ambulatory Visit: Payer: Self-pay

## 2017-12-26 NOTE — Patient Outreach (Signed)
[facility]) Care Management  12/26/2017   Denise Freeman [facility] [birth year] [id number]  Subjective: Telephone call to the patient for assessment. HIPAA verified. The patient states that she is in the bed with back and leg pain that she rates at a 9/10.  She is taking her pain medication to ease the pain.  She states that her weight today is 175.  She says that her weight is coming down because she had some fluid retention last week. She is having a small amount of swelling in her legs. She denies any chest pain or shortness of breath. Reviewed signs and symptoms of heart failure, discussed with patient CHF action plan and encouraged patient to weigh daily.  Patient verbalized understanding.  She was seen in her physician office on 6/12 and her a1c 8.6.  Her blood sugar this morning was 164.  She denies having any lows but states that her blood sugar will go up to about 215 230 never higher.  She has received a new glucometer the [place] and loves it.  She feels that her blood sugars are resulted better.  She is having to monitor her diet due to gout and salt intake.  She states that she is adherent with her medications. The patients next appointment with Dr Dorris Fetch is in September.  Current Medications:  Current Outpatient Medications  Medication Sig Dispense Refill  . albuterol (PROVENTIL) (2.5 MG/3ML) 0.083% nebulizer solution USE (1) IN NEBULIZER EVERY 6 HOURS AS NEEDED FOR SHORTNESS OF BREATH. 75 mL 2  . allopurinol (ZYLOPRIM) 100 MG tablet TAKE 1 TABLET BY MOUTH ONCE DAILY. 30 tablet 0  . cetirizine (ZYRTEC) 10 MG tablet Take 1 tablet (10 mg total) by mouth at bedtime. 30 tablet 11  . cholecalciferol (VITAMIN D) 400 units TABS tablet Take 1 tablet (400 Units total) by mouth 2 (two) times daily. 180 each 3  . colchicine 0.6 MG tablet Take 1 tablet (0.6 mg total) by mouth daily. 30 tablet 1  . docusate sodium (COLACE) 100 MG capsule Take 100 mg by mouth daily.    Marland Kitchen escitalopram (LEXAPRO) 10  MG tablet Take 1 tablet (10 mg total) by mouth at bedtime. 90 tablet 0  . fenofibrate 54 MG tablet TAKE 1 TABLET BY MOUTH ONCE DAILY. 28 tablet 3  . ferrous sulfate 324 (65 Fe) MG TBEC Take 1 tablet by mouth daily.    . furosemide (LASIX) 40 MG tablet Take 2 tablets (80 mg total) by mouth 3 (three) times daily. 180 tablet 5  . Insulin Glargine (BASAGLAR KWIKPEN) 100 UNIT/ML SOPN Inject 0.6 mLs (60 Units total) into the skin at bedtime. 15 mL 2  . insulin lispro (HUMALOG KWIKPEN) 100 UNIT/ML KiwkPen Inject 0.1-0.16 mLs (10-16 Units total) into the skin 3 (three) times daily before meals. 15 mL 2  . ipratropium (ATROVENT) 0.02 % nebulizer solution USE (1) IN NEBULIZER EVERY 6 HOURS AS NEEDED FOR SHORTNESS OF BREATH. 75 mL 2  . losartan (COZAAR) 25 MG tablet Take 0.5 tablets (12.5 mg total) by mouth daily. 45 tablet 2  . metolazone (ZAROXOLYN) 2.5 MG tablet Take 1 tablet (2.5 mg total) by mouth daily. Take 30 minutes prior to morning LASIX 90 tablet 3  . metoprolol succinate (TOPROL-XL) 25 MG 24 hr tablet TAKE 1 TABLET BY MOUTH TWICE DAILY 56 tablet 5  . mirtazapine (REMERON) 15 MG tablet Take 1 tablet (15 mg total) by mouth at bedtime. 90 tablet 0  . modafinil (PROVIGIL) 200 MG tablet Take  1 tablet (200 mg total) by mouth daily. 30 tablet 5  . mometasone (NASONEX) 50 MCG/ACT nasal spray Place 1 spray into the nose daily. 17 g 11  . montelukast (SINGULAIR) 10 MG tablet TAKE ONE TABLET BY MOUTH AT BEDTIME. 28 tablet 2  . mupirocin cream (BACTROBAN) 2 % Apply 1 application topically 2 (two) times daily. 15 g 0  . nitroGLYCERIN (NITROSTAT) 0.4 MG SL tablet Place 1 tablet (0.4 mg total) under the tongue every 5 (five) minutes as needed for chest pain. 25 tablet 3  . nystatin (NYSTATIN) powder Apply topically 2 (two) times daily. Apply under breasts 15 g 3  . oxyCODONE-acetaminophen (PERCOCET/ROXICET) 5-325 MG tablet Take 1 tablet by mouth 2 (two) times daily as needed for severe pain. 60 tablet 0  .  OXYGEN Inhale 3 L into the lungs continuous. 3 liters 24/7    . pantoprazole (PROTONIX) 20 MG tablet TAKE (1) TABLET BY MOUTH TWICE A DAY BEFORE MEALS. (BREAKFAST AND SUPPER) 56 tablet 0  . PRESCRIPTION MEDICATION Inhale into the lungs See admin instructions. Use BIPAP every time laying down    . primidone (MYSOLINE) 50 MG tablet TAKE ONE TABLET BY MOUTH AT BEDTIME. 30 tablet 5  . Probiotic Product (PROBIOTIC PO) Take 1 tablet by mouth at bedtime.    . triamcinolone lotion (KENALOG) 0.1 % Apply 1 application topically 3 (three) times daily. For up to 14 days and as needed 60 mL 1  . vitamin A 10000 UNIT capsule Take 10,000 Units by mouth every morning.    . warfarin (COUMADIN) 4 MG tablet Take 1 tablet daily except 1 1/2 tablets on Saturdays 35 tablet 4  . colchicine 0.6 MG tablet Take 1 tablet (0.6 mg total) by mouth daily. If have stomach upset, can reduce to every other day. 30 tablet 0  . Insulin Pen Needle 32G X 4 MM MISC Use to inject insulin 5 times daily 500 each 5  . predniSONE (DELTASONE) 5 MG tablet Take 1 tablet (5 mg total) by mouth daily with breakfast. (Patient not taking: Reported on 12/26/2017) 30 tablet 2  . TRUE METRIX BLOOD GLUCOSE TEST test strip USE TO TEST BLOOD SUGAR AS DIRECTED. 50 each 2   No current facility-administered medications for this visit.     Functional Status:  No flowsheet data found.  Fall/Depression Screening: Fall Risk  12/26/2017 11/28/2017 11/27/2017  Falls in the past year? No No No  Number falls in past yr: - - -  Injury with Fall? - - -  Comment - - -  Risk Factor Category  - - -  Comment - - -  Risk for fall due to : - Impaired balance/gait;Impaired mobility -  Risk for fall due to: Comment - - -  Follow up - - -   PHQ 2/9 Scores 11/28/2017 11/27/2017 10/03/2017 09/15/2017 09/05/2017 08/07/2017 07/29/2017  PHQ - 2 Score 0 5 6 0 0 0 0  PHQ- 9 Score - - 23 - - - -    Assessment: Patient will continue to benefit from health coach outreach for disease  management and support.  THN CM Care Plan Problem One     Most Recent Value  THN Long Term Goal   IN 90 days the patient will lower her a1c  8.6 by 1 to 2 points  [facility] Long Term Goal Start Date  12/26/17  Interventions for Problem One Long Term Goal  Reviewed cbg record,  discussed dietary intake,  reviewed and discussed  medication management      Plan: RN Health Coach will contact patient in the month of August and patient agrees to next outreach.  [name] RN, BSN, [place] Direct Dial:  [phone]  Fax: [phone]

## 2017-12-29 ENCOUNTER — Ambulatory Visit (INDEPENDENT_AMBULATORY_CARE_PROVIDER_SITE_OTHER): Payer: Medicare Other | Admitting: *Deleted

## 2017-12-29 DIAGNOSIS — I4891 Unspecified atrial fibrillation: Secondary | ICD-10-CM | POA: Diagnosis not present

## 2017-12-29 DIAGNOSIS — Z5181 Encounter for therapeutic drug level monitoring: Secondary | ICD-10-CM

## 2017-12-29 DIAGNOSIS — J449 Chronic obstructive pulmonary disease, unspecified: Secondary | ICD-10-CM | POA: Diagnosis not present

## 2017-12-29 DIAGNOSIS — I5042 Chronic combined systolic (congestive) and diastolic (congestive) heart failure: Secondary | ICD-10-CM | POA: Diagnosis not present

## 2017-12-29 DIAGNOSIS — G894 Chronic pain syndrome: Secondary | ICD-10-CM | POA: Diagnosis not present

## 2017-12-29 DIAGNOSIS — I130 Hypertensive heart and chronic kidney disease with heart failure and stage 1 through stage 4 chronic kidney disease, or unspecified chronic kidney disease: Secondary | ICD-10-CM | POA: Diagnosis not present

## 2017-12-29 DIAGNOSIS — E1122 Type 2 diabetes mellitus with diabetic chronic kidney disease: Secondary | ICD-10-CM | POA: Diagnosis not present

## 2017-12-29 DIAGNOSIS — E1151 Type 2 diabetes mellitus with diabetic peripheral angiopathy without gangrene: Secondary | ICD-10-CM | POA: Diagnosis not present

## 2017-12-29 LAB — POCT INR: INR: 1.5 — AB (ref 2.0–3.0)

## 2017-12-29 NOTE — Patient Instructions (Signed)
Take coumadin 8mg  tonight then increase dose to 4mg  daily except 6mg  on Tuesdays and Fridays Recheck in 1 week  Order given to [facility]

## 2017-12-30 ENCOUNTER — Other Ambulatory Visit: Payer: Self-pay | Admitting: Pulmonary Disease

## 2017-12-31 DIAGNOSIS — R070 Pain in throat: Secondary | ICD-10-CM | POA: Diagnosis not present

## 2017-12-31 DIAGNOSIS — Z6837 Body mass index (BMI) 37.0-37.9, adult: Secondary | ICD-10-CM | POA: Diagnosis not present

## 2017-12-31 DIAGNOSIS — I482 Chronic atrial fibrillation: Secondary | ICD-10-CM | POA: Diagnosis not present

## 2017-12-31 DIAGNOSIS — K219 Gastro-esophageal reflux disease without esophagitis: Secondary | ICD-10-CM | POA: Diagnosis not present

## 2017-12-31 DIAGNOSIS — J069 Acute upper respiratory infection, unspecified: Secondary | ICD-10-CM | POA: Diagnosis not present

## 2017-12-31 DIAGNOSIS — R251 Tremor, unspecified: Secondary | ICD-10-CM | POA: Diagnosis not present

## 2017-12-31 DIAGNOSIS — E559 Vitamin D deficiency, unspecified: Secondary | ICD-10-CM | POA: Diagnosis not present

## 2017-12-31 DIAGNOSIS — Z6836 Body mass index (BMI) 36.0-36.9, adult: Secondary | ICD-10-CM | POA: Diagnosis not present

## 2017-12-31 DIAGNOSIS — M109 Gout, unspecified: Secondary | ICD-10-CM | POA: Diagnosis not present

## 2017-12-31 DIAGNOSIS — E782 Mixed hyperlipidemia: Secondary | ICD-10-CM | POA: Diagnosis not present

## 2017-12-31 DIAGNOSIS — E1165 Type 2 diabetes mellitus with hyperglycemia: Secondary | ICD-10-CM | POA: Diagnosis not present

## 2017-12-31 DIAGNOSIS — K5900 Constipation, unspecified: Secondary | ICD-10-CM | POA: Diagnosis not present

## 2018-01-05 ENCOUNTER — Ambulatory Visit (INDEPENDENT_AMBULATORY_CARE_PROVIDER_SITE_OTHER): Payer: Medicare Other | Admitting: Cardiology

## 2018-01-05 ENCOUNTER — Ambulatory Visit: Payer: Medicare Other

## 2018-01-05 DIAGNOSIS — I4891 Unspecified atrial fibrillation: Secondary | ICD-10-CM

## 2018-01-05 DIAGNOSIS — G894 Chronic pain syndrome: Secondary | ICD-10-CM | POA: Diagnosis not present

## 2018-01-05 DIAGNOSIS — J449 Chronic obstructive pulmonary disease, unspecified: Secondary | ICD-10-CM | POA: Diagnosis not present

## 2018-01-05 DIAGNOSIS — E1151 Type 2 diabetes mellitus with diabetic peripheral angiopathy without gangrene: Secondary | ICD-10-CM | POA: Diagnosis not present

## 2018-01-05 DIAGNOSIS — E1122 Type 2 diabetes mellitus with diabetic chronic kidney disease: Secondary | ICD-10-CM | POA: Diagnosis not present

## 2018-01-05 DIAGNOSIS — I130 Hypertensive heart and chronic kidney disease with heart failure and stage 1 through stage 4 chronic kidney disease, or unspecified chronic kidney disease: Secondary | ICD-10-CM | POA: Diagnosis not present

## 2018-01-05 DIAGNOSIS — I5042 Chronic combined systolic (congestive) and diastolic (congestive) heart failure: Secondary | ICD-10-CM | POA: Diagnosis not present

## 2018-01-05 LAB — POCT INR: INR: 2.6 (ref 2.0–3.0)

## 2018-01-06 ENCOUNTER — Telehealth: Payer: Self-pay | Admitting: Internal Medicine

## 2018-01-06 DIAGNOSIS — E782 Mixed hyperlipidemia: Secondary | ICD-10-CM | POA: Diagnosis not present

## 2018-01-06 DIAGNOSIS — F3130 Bipolar disorder, current episode depressed, mild or moderate severity, unspecified: Secondary | ICD-10-CM | POA: Diagnosis not present

## 2018-01-06 DIAGNOSIS — D631 Anemia in chronic kidney disease: Secondary | ICD-10-CM | POA: Diagnosis not present

## 2018-01-06 DIAGNOSIS — H6120 Impacted cerumen, unspecified ear: Secondary | ICD-10-CM | POA: Diagnosis not present

## 2018-01-06 DIAGNOSIS — E1122 Type 2 diabetes mellitus with diabetic chronic kidney disease: Secondary | ICD-10-CM | POA: Diagnosis not present

## 2018-01-06 DIAGNOSIS — I482 Chronic atrial fibrillation: Secondary | ICD-10-CM | POA: Diagnosis not present

## 2018-01-06 DIAGNOSIS — N183 Chronic kidney disease, stage 3 (moderate): Secondary | ICD-10-CM | POA: Diagnosis not present

## 2018-01-06 DIAGNOSIS — I5022 Chronic systolic (congestive) heart failure: Secondary | ICD-10-CM | POA: Diagnosis not present

## 2018-01-06 DIAGNOSIS — Z0001 Encounter for general adult medical examination with abnormal findings: Secondary | ICD-10-CM | POA: Diagnosis not present

## 2018-01-06 DIAGNOSIS — J069 Acute upper respiratory infection, unspecified: Secondary | ICD-10-CM | POA: Diagnosis not present

## 2018-01-06 DIAGNOSIS — G2581 Restless legs syndrome: Secondary | ICD-10-CM | POA: Diagnosis not present

## 2018-01-06 DIAGNOSIS — G4733 Obstructive sleep apnea (adult) (pediatric): Secondary | ICD-10-CM | POA: Diagnosis not present

## 2018-01-06 MED ORDER — METOLAZONE 2.5 MG PO TABS: 2.5000 mg | ORAL_TABLET | Freq: Every day | ORAL | 0 refills | Status: DC

## 2018-01-06 NOTE — Telephone Encounter (Signed)
New Message         *STAT* If patient is at the pharmacy, call can be transferred to refill team.   1. Which medications need to be refilled? (please list name of each medication and dose if known) metolazone (ZAROXOLYN) 2.5 MG tablet  2. Which pharmacy/location (including street and city if local pharmacy) is medication to be sent to? [place] [address]  3. Do they need a 30 day or 90 day supply? 30   Per patient her PCP would for like for the patient to have a new written Rx for "as needed". Per PCP she wants this done to help the patient's kidney get stronger

## 2018-01-07 ENCOUNTER — Other Ambulatory Visit: Payer: Self-pay | Admitting: Internal Medicine

## 2018-01-07 ENCOUNTER — Telehealth: Payer: Self-pay | Admitting: Internal Medicine

## 2018-01-07 DIAGNOSIS — Z780 Asymptomatic menopausal state: Secondary | ICD-10-CM

## 2018-01-07 MED ORDER — METOLAZONE 2.5 MG PO TABS: 2.5000 mg | ORAL_TABLET | Freq: Every day | ORAL | 3 refills | Status: DC | PRN

## 2018-01-07 NOTE — Telephone Encounter (Signed)
Pt called to report that her Family MD changed her Zaroxolyn to prn only.. since the pt has not been having much edema and has had some abnormal kidney function tests. She is going to follow up with her and let us know any further changes in her meds or her health status.

## 2018-01-07 NOTE — Telephone Encounter (Signed)
New message   Pt c/o medication issue:  1. Name of Medication: metolazone (ZAROXOLYN) 2.5 MG tablet  2. How are you currently taking this medication (dosage and times per day)? Take 1 tablet (2.5 mg total) by mouth daily. Take 30 minutes prior to morning LASIX  3. Are you having a reaction (difficulty breathing--STAT)? No   4. What is your medication issue? The patient states that she is not retaining a lot of fluid at this time. Her Family Dr. [name] states that this medicine needs to be taken as needed not on a daily basis. Please advise.

## 2018-01-08 ENCOUNTER — Ambulatory Visit: Payer: Medicare Other

## 2018-01-13 DIAGNOSIS — J449 Chronic obstructive pulmonary disease, unspecified: Secondary | ICD-10-CM | POA: Diagnosis not present

## 2018-01-13 DIAGNOSIS — I5042 Chronic combined systolic (congestive) and diastolic (congestive) heart failure: Secondary | ICD-10-CM | POA: Diagnosis not present

## 2018-01-13 DIAGNOSIS — E1151 Type 2 diabetes mellitus with diabetic peripheral angiopathy without gangrene: Secondary | ICD-10-CM | POA: Diagnosis not present

## 2018-01-13 DIAGNOSIS — E1122 Type 2 diabetes mellitus with diabetic chronic kidney disease: Secondary | ICD-10-CM | POA: Diagnosis not present

## 2018-01-13 DIAGNOSIS — I130 Hypertensive heart and chronic kidney disease with heart failure and stage 1 through stage 4 chronic kidney disease, or unspecified chronic kidney disease: Secondary | ICD-10-CM | POA: Diagnosis not present

## 2018-01-13 DIAGNOSIS — G894 Chronic pain syndrome: Secondary | ICD-10-CM | POA: Diagnosis not present

## 2018-01-14 ENCOUNTER — Ambulatory Visit (INDEPENDENT_AMBULATORY_CARE_PROVIDER_SITE_OTHER): Payer: Medicare Other | Admitting: *Deleted

## 2018-01-14 DIAGNOSIS — I4891 Unspecified atrial fibrillation: Secondary | ICD-10-CM

## 2018-01-14 DIAGNOSIS — Z5181 Encounter for therapeutic drug level monitoring: Secondary | ICD-10-CM

## 2018-01-14 LAB — POCT INR: INR: 2.9 (ref 2.0–3.0)

## 2018-01-14 NOTE — Patient Instructions (Signed)
Continue same dose of 4mg  daily except 6mg  on Tuesdays and Fridays Recheck in 2 weeks Order given to [place]

## 2018-01-19 DIAGNOSIS — I130 Hypertensive heart and chronic kidney disease with heart failure and stage 1 through stage 4 chronic kidney disease, or unspecified chronic kidney disease: Secondary | ICD-10-CM | POA: Diagnosis not present

## 2018-01-19 DIAGNOSIS — E1122 Type 2 diabetes mellitus with diabetic chronic kidney disease: Secondary | ICD-10-CM | POA: Diagnosis not present

## 2018-01-19 DIAGNOSIS — J449 Chronic obstructive pulmonary disease, unspecified: Secondary | ICD-10-CM | POA: Diagnosis not present

## 2018-01-19 DIAGNOSIS — G894 Chronic pain syndrome: Secondary | ICD-10-CM | POA: Diagnosis not present

## 2018-01-19 DIAGNOSIS — E1151 Type 2 diabetes mellitus with diabetic peripheral angiopathy without gangrene: Secondary | ICD-10-CM | POA: Diagnosis not present

## 2018-01-19 DIAGNOSIS — I5042 Chronic combined systolic (congestive) and diastolic (congestive) heart failure: Secondary | ICD-10-CM | POA: Diagnosis not present

## 2018-01-21 DIAGNOSIS — M79606 Pain in leg, unspecified: Secondary | ICD-10-CM | POA: Diagnosis not present

## 2018-01-21 DIAGNOSIS — M797 Fibromyalgia: Secondary | ICD-10-CM | POA: Diagnosis not present

## 2018-01-21 DIAGNOSIS — J449 Chronic obstructive pulmonary disease, unspecified: Secondary | ICD-10-CM | POA: Diagnosis not present

## 2018-01-21 DIAGNOSIS — Z6836 Body mass index (BMI) 36.0-36.9, adult: Secondary | ICD-10-CM | POA: Diagnosis not present

## 2018-01-21 DIAGNOSIS — M545 Low back pain: Secondary | ICD-10-CM | POA: Diagnosis not present

## 2018-01-21 DIAGNOSIS — F3130 Bipolar disorder, current episode depressed, mild or moderate severity, unspecified: Secondary | ICD-10-CM | POA: Diagnosis not present

## 2018-01-21 DIAGNOSIS — K5900 Constipation, unspecified: Secondary | ICD-10-CM | POA: Diagnosis not present

## 2018-01-21 DIAGNOSIS — Z6837 Body mass index (BMI) 37.0-37.9, adult: Secondary | ICD-10-CM | POA: Diagnosis not present

## 2018-01-21 DIAGNOSIS — D631 Anemia in chronic kidney disease: Secondary | ICD-10-CM | POA: Diagnosis not present

## 2018-01-21 DIAGNOSIS — G8929 Other chronic pain: Secondary | ICD-10-CM | POA: Diagnosis not present

## 2018-01-21 DIAGNOSIS — K219 Gastro-esophageal reflux disease without esophagitis: Secondary | ICD-10-CM | POA: Diagnosis not present

## 2018-01-21 DIAGNOSIS — I5022 Chronic systolic (congestive) heart failure: Secondary | ICD-10-CM | POA: Diagnosis not present

## 2018-01-21 DIAGNOSIS — M109 Gout, unspecified: Secondary | ICD-10-CM | POA: Diagnosis not present

## 2018-01-21 DIAGNOSIS — E1122 Type 2 diabetes mellitus with diabetic chronic kidney disease: Secondary | ICD-10-CM | POA: Diagnosis not present

## 2018-01-21 DIAGNOSIS — R251 Tremor, unspecified: Secondary | ICD-10-CM | POA: Diagnosis not present

## 2018-01-21 DIAGNOSIS — J9610 Chronic respiratory failure, unspecified whether with hypoxia or hypercapnia: Secondary | ICD-10-CM | POA: Diagnosis not present

## 2018-01-21 DIAGNOSIS — E782 Mixed hyperlipidemia: Secondary | ICD-10-CM | POA: Diagnosis not present

## 2018-01-21 DIAGNOSIS — E559 Vitamin D deficiency, unspecified: Secondary | ICD-10-CM | POA: Diagnosis not present

## 2018-01-21 DIAGNOSIS — N183 Chronic kidney disease, stage 3 (moderate): Secondary | ICD-10-CM | POA: Diagnosis not present

## 2018-01-22 ENCOUNTER — Other Ambulatory Visit: Payer: Self-pay | Admitting: Internal Medicine

## 2018-01-22 DIAGNOSIS — M797 Fibromyalgia: Secondary | ICD-10-CM

## 2018-01-22 DIAGNOSIS — M79604 Pain in right leg: Secondary | ICD-10-CM

## 2018-01-22 DIAGNOSIS — M545 Low back pain, unspecified: Secondary | ICD-10-CM

## 2018-01-27 ENCOUNTER — Other Ambulatory Visit (HOSPITAL_COMMUNITY)
Admission: RE | Admit: 2018-01-27 | Discharge: 2018-01-27 | Disposition: A | Discharge status: Home / Self Care | Payer: Medicare Other | Source: Other Acute Inpatient Hospital | Attending: Internal Medicine | Admitting: Internal Medicine

## 2018-01-27 ENCOUNTER — Ambulatory Visit (INDEPENDENT_AMBULATORY_CARE_PROVIDER_SITE_OTHER): Payer: Medicare Other | Admitting: *Deleted

## 2018-01-27 DIAGNOSIS — Z5181 Encounter for therapeutic drug level monitoring: Secondary | ICD-10-CM | POA: Diagnosis not present

## 2018-01-27 DIAGNOSIS — J449 Chronic obstructive pulmonary disease, unspecified: Secondary | ICD-10-CM | POA: Diagnosis not present

## 2018-01-27 DIAGNOSIS — Z7901 Long term (current) use of anticoagulants: Secondary | ICD-10-CM | POA: Diagnosis not present

## 2018-01-27 DIAGNOSIS — E1122 Type 2 diabetes mellitus with diabetic chronic kidney disease: Secondary | ICD-10-CM | POA: Diagnosis not present

## 2018-01-27 DIAGNOSIS — I130 Hypertensive heart and chronic kidney disease with heart failure and stage 1 through stage 4 chronic kidney disease, or unspecified chronic kidney disease: Secondary | ICD-10-CM | POA: Diagnosis not present

## 2018-01-27 DIAGNOSIS — E1151 Type 2 diabetes mellitus with diabetic peripheral angiopathy without gangrene: Secondary | ICD-10-CM | POA: Diagnosis not present

## 2018-01-27 DIAGNOSIS — I4891 Unspecified atrial fibrillation: Secondary | ICD-10-CM

## 2018-01-27 DIAGNOSIS — I5042 Chronic combined systolic (congestive) and diastolic (congestive) heart failure: Secondary | ICD-10-CM | POA: Diagnosis not present

## 2018-01-27 DIAGNOSIS — G894 Chronic pain syndrome: Secondary | ICD-10-CM | POA: Diagnosis not present

## 2018-01-27 LAB — PROTIME-INR
INR: 4.33
Prothrombin Time: 41.2 seconds — ABNORMAL HIGH (ref 11.4–15.2)

## 2018-01-27 NOTE — Patient Instructions (Signed)
Hold coumadin tonight then resume 4mg  daily except 6mg  on Tuesdays and Fridays Recheck in 1 week Order given to [facility]

## 2018-01-29 ENCOUNTER — Other Ambulatory Visit: Payer: Self-pay

## 2018-01-29 NOTE — Patient Outreach (Signed)
[facility]) Care Management  01/29/2018   Denise Freeman [facility] [birth year] [id number]  Subjective: Telephone call to the patient for assessment.  HIPAA verified. The patient stats that she had been having problems with gout.  She was seen at her new physician office Dr Delphina Cahill  One week ago.  She states that she will be having some test on her legs to help find out what is causing her pain.  She denies any falls.  She states that her blood sugar this morning was 145.  She is monitoring her food intake. She states that she is taking her medications as prescribed.   Current Medications:  Current Outpatient Medications  Medication Sig Dispense Refill  . albuterol (PROVENTIL) (2.5 MG/3ML) 0.083% nebulizer solution USE (1) IN NEBULIZER EVERY 6 HOURS AS NEEDED FOR SHORTNESS OF BREATH. 75 mL 2  . allopurinol (ZYLOPRIM) 100 MG tablet TAKE 1 TABLET BY MOUTH ONCE DAILY. 30 tablet 0  . cetirizine (ZYRTEC) 10 MG tablet Take 1 tablet (10 mg total) by mouth at bedtime. 30 tablet 11  . cholecalciferol (VITAMIN D) 400 units TABS tablet Take 1 tablet (400 Units total) by mouth 2 (two) times daily. 180 each 3  . colchicine 0.6 MG tablet Take 1 tablet (0.6 mg total) by mouth daily. 30 tablet 1  . colchicine 0.6 MG tablet Take 1 tablet (0.6 mg total) by mouth daily. If have stomach upset, can reduce to every other day. 30 tablet 0  . docusate sodium (COLACE) 100 MG capsule Take 100 mg by mouth daily.    Marland Kitchen escitalopram (LEXAPRO) 10 MG tablet Take 1 tablet (10 mg total) by mouth at bedtime. 90 tablet 0  . ferrous sulfate 324 (65 Fe) MG TBEC Take 1 tablet by mouth daily.    . furosemide (LASIX) 40 MG tablet Take 2 tablets (80 mg total) by mouth 3 (three) times daily. 180 tablet 5  . Insulin Glargine (BASAGLAR KWIKPEN) 100 UNIT/ML SOPN Inject 0.6 mLs (60 Units total) into the skin at bedtime. 15 mL 2  . insulin lispro (HUMALOG KWIKPEN) 100 UNIT/ML KiwkPen Inject 0.1-0.16 mLs (10-16 Units total) into the  skin 3 (three) times daily before meals. 15 mL 2  . Insulin Pen Needle 32G X 4 MM MISC Use to inject insulin 5 times daily 500 each 5  . ipratropium (ATROVENT) 0.02 % nebulizer solution USE (1) IN NEBULIZER EVERY 6 HOURS AS NEEDED FOR SHORTNESS OF BREATH. 75 mL 2  . metoprolol succinate (TOPROL-XL) 25 MG 24 hr tablet TAKE 1 TABLET BY MOUTH TWICE DAILY 56 tablet 5  . mirtazapine (REMERON) 15 MG tablet Take 1 tablet (15 mg total) by mouth at bedtime. 90 tablet 0  . modafinil (PROVIGIL) 200 MG tablet Take 1 tablet (200 mg total) by mouth daily. 30 tablet 5  . mometasone (NASONEX) 50 MCG/ACT nasal spray Place 1 spray into the nose daily. 17 g 11  . montelukast (SINGULAIR) 10 MG tablet TAKE ONE TABLET BY MOUTH AT BEDTIME. 28 tablet 0  . nitroGLYCERIN (NITROSTAT) 0.4 MG SL tablet Place 1 tablet (0.4 mg total) under the tongue every 5 (five) minutes as needed for chest pain. 25 tablet 3  . nystatin (NYSTATIN) powder Apply topically 2 (two) times daily. Apply under breasts 15 g 3  . oxyCODONE-acetaminophen (PERCOCET/ROXICET) 5-325 MG tablet Take 1 tablet by mouth 2 (two) times daily as needed for severe pain. 60 tablet 0  . OXYGEN Inhale 3 L into the lungs continuous. 3 liters  24/7    . pantoprazole (PROTONIX) 20 MG tablet TAKE (1) TABLET BY MOUTH TWICE A DAY BEFORE MEALS. (BREAKFAST AND SUPPER) 56 tablet 0  . PRESCRIPTION MEDICATION Inhale into the lungs See admin instructions. Use BIPAP every time laying down    . primidone (MYSOLINE) 50 MG tablet TAKE ONE TABLET BY MOUTH AT BEDTIME. 30 tablet 5  . Probiotic Product (PROBIOTIC PO) Take 1 tablet by mouth at bedtime.    . triamcinolone lotion (KENALOG) 0.1 % Apply 1 application topically 3 (three) times daily. For up to 14 days and as needed 60 mL 1  . vitamin A 10000 UNIT capsule Take 10,000 Units by mouth every morning.    . warfarin (COUMADIN) 4 MG tablet Take 1 tablet daily except 1 1/2 tablets on Saturdays 35 tablet 4  . fenofibrate 54 MG tablet TAKE  1 TABLET BY MOUTH ONCE DAILY. 28 tablet 3  . losartan (COZAAR) 25 MG tablet Take 0.5 tablets (12.5 mg total) by mouth daily. 45 tablet 2  . metolazone (ZAROXOLYN) 2.5 MG tablet Take 1 tablet (2.5 mg total) by mouth daily as needed (to be used as needed for edema). Take 30 minutes prior to morning LASIX 30 tablet 3  . mupirocin cream (BACTROBAN) 2 % Apply 1 application topically 2 (two) times daily. 15 g 0  . predniSONE (DELTASONE) 5 MG tablet Take 1 tablet (5 mg total) by mouth daily with breakfast. (Patient not taking: Reported on 12/26/2017) 30 tablet 2  . TRUE METRIX BLOOD GLUCOSE TEST test strip USE TO TEST BLOOD SUGAR AS DIRECTED. (Patient not taking: Reported on 01/29/2018) 50 each 2   No current facility-administered medications for this visit.     Functional Status:  No flowsheet data found.  Fall/Depression Screening: Fall Risk  12/26/2017 11/28/2017 11/27/2017  Falls in the past year? No No No  Number falls in past yr: - - -  Injury with Fall? - - -  Comment - - -  Risk Factor Category  - - -  Comment - - -  Risk for fall due to : - Impaired balance/gait;Impaired mobility -  Risk for fall due to: Comment - - -  Follow up - - -   PHQ 2/9 Scores 11/28/2017 11/27/2017 10/03/2017 09/15/2017 09/05/2017 08/07/2017 07/29/2017  PHQ - 2 Score 0 5 6 0 0 0 0  PHQ- 9 Score - - 23 - - - -    Assessment: Patient will continue to benefit from health coach outreach for disease management and support.  THN CM Care Plan Problem One     Most Recent Value  THN Long Term Goal   IN 90 days the patient will lower her a1c  8.6 by 1 to 2 points  [facility] Long Term Goal Start Date  01/29/18  Interventions for Problem One Long Term Goal  Discussed diet medications and cbg readings with the patient.       Plan: RN Health Coach will contact patient in the month of October and patient agrees to next outreach.  [name] RN, BSN, [place] Direct Dial:   [phone]  Fax: [phone]

## 2018-02-03 ENCOUNTER — Ambulatory Visit (INDEPENDENT_AMBULATORY_CARE_PROVIDER_SITE_OTHER): Payer: Medicare Other | Admitting: *Deleted

## 2018-02-03 DIAGNOSIS — E1165 Type 2 diabetes mellitus with hyperglycemia: Secondary | ICD-10-CM | POA: Diagnosis not present

## 2018-02-03 DIAGNOSIS — I4891 Unspecified atrial fibrillation: Secondary | ICD-10-CM | POA: Diagnosis not present

## 2018-02-03 DIAGNOSIS — E1151 Type 2 diabetes mellitus with diabetic peripheral angiopathy without gangrene: Secondary | ICD-10-CM | POA: Diagnosis not present

## 2018-02-03 DIAGNOSIS — Z6837 Body mass index (BMI) 37.0-37.9, adult: Secondary | ICD-10-CM | POA: Diagnosis not present

## 2018-02-03 DIAGNOSIS — Z5181 Encounter for therapeutic drug level monitoring: Secondary | ICD-10-CM

## 2018-02-03 DIAGNOSIS — J449 Chronic obstructive pulmonary disease, unspecified: Secondary | ICD-10-CM | POA: Diagnosis not present

## 2018-02-03 DIAGNOSIS — I130 Hypertensive heart and chronic kidney disease with heart failure and stage 1 through stage 4 chronic kidney disease, or unspecified chronic kidney disease: Secondary | ICD-10-CM | POA: Diagnosis not present

## 2018-02-03 DIAGNOSIS — I5042 Chronic combined systolic (congestive) and diastolic (congestive) heart failure: Secondary | ICD-10-CM | POA: Diagnosis not present

## 2018-02-03 DIAGNOSIS — E1122 Type 2 diabetes mellitus with diabetic chronic kidney disease: Secondary | ICD-10-CM | POA: Diagnosis not present

## 2018-02-03 DIAGNOSIS — G894 Chronic pain syndrome: Secondary | ICD-10-CM | POA: Diagnosis not present

## 2018-02-03 LAB — POCT INR: INR: 4.4 — AB (ref 2.0–3.0)

## 2018-02-03 NOTE — Patient Instructions (Signed)
Hold coumadin tonight then decrease dose to 4mg  daily except 6mg  on Tuesdays  Recheck in 2 weeks Order given to Charna Busman RN [facility]  Pt d/c from [facility] today

## 2018-02-04 ENCOUNTER — Other Ambulatory Visit: Payer: Self-pay | Admitting: "Endocrinology

## 2018-02-06 ENCOUNTER — Ambulatory Visit (HOSPITAL_COMMUNITY)
Admission: RE | Admit: 2018-02-06 | Discharge: 2018-02-06 | Disposition: A | Discharge status: Home / Self Care | Payer: Medicare Other | Source: Ambulatory Visit | Attending: Internal Medicine | Admitting: Internal Medicine

## 2018-02-06 DIAGNOSIS — M79604 Pain in right leg: Secondary | ICD-10-CM | POA: Insufficient documentation

## 2018-02-06 DIAGNOSIS — M797 Fibromyalgia: Secondary | ICD-10-CM

## 2018-02-06 DIAGNOSIS — M48061 Spinal stenosis, lumbar region without neurogenic claudication: Secondary | ICD-10-CM | POA: Insufficient documentation

## 2018-02-06 DIAGNOSIS — M545 Low back pain, unspecified: Secondary | ICD-10-CM

## 2018-02-06 DIAGNOSIS — M8540 Solitary bone cyst, unspecified site: Secondary | ICD-10-CM | POA: Diagnosis not present

## 2018-02-06 DIAGNOSIS — M1611 Unilateral primary osteoarthritis, right hip: Secondary | ICD-10-CM | POA: Diagnosis not present

## 2018-02-09 ENCOUNTER — Other Ambulatory Visit: Payer: Self-pay

## 2018-02-09 MED ORDER — ALBUTEROL SULFATE (2.5 MG/3ML) 0.083% IN NEBU: INHALATION_SOLUTION | RESPIRATORY_TRACT | 2 refills | Status: DC

## 2018-02-12 ENCOUNTER — Other Ambulatory Visit (HOSPITAL_COMMUNITY): Payer: Self-pay

## 2018-02-16 ENCOUNTER — Ambulatory Visit: Payer: Medicare Other | Admitting: "Endocrinology

## 2018-02-17 ENCOUNTER — Telehealth: Payer: Self-pay | Admitting: Internal Medicine

## 2018-02-17 NOTE — Telephone Encounter (Signed)
Faxed signed orders pertaining to INR to [facility] @ [phone]

## 2018-02-18 ENCOUNTER — Ambulatory Visit (INDEPENDENT_AMBULATORY_CARE_PROVIDER_SITE_OTHER): Payer: Medicare Other | Admitting: *Deleted

## 2018-02-18 DIAGNOSIS — I4891 Unspecified atrial fibrillation: Secondary | ICD-10-CM

## 2018-02-18 DIAGNOSIS — I480 Paroxysmal atrial fibrillation: Secondary | ICD-10-CM

## 2018-02-18 LAB — POCT INR: INR: 4 — AB (ref 2.0–3.0)

## 2018-02-18 MED ORDER — WARFARIN SODIUM 4 MG PO TABS: ORAL_TABLET | ORAL | 4 refills | Status: DC

## 2018-02-18 NOTE — Patient Instructions (Signed)
Hold coumadin tonight then decrease dose to 4mg  daily (1 tab) except 2mg  (1/2 tab) on Thursdays  Recheck in 2 weeks

## 2018-02-19 ENCOUNTER — Other Ambulatory Visit: Payer: Self-pay | Admitting: Pulmonary Disease

## 2018-03-02 ENCOUNTER — Other Ambulatory Visit: Payer: Self-pay | Admitting: Adult Health Nurse Practitioner

## 2018-03-02 DIAGNOSIS — Z1231 Encounter for screening mammogram for malignant neoplasm of breast: Secondary | ICD-10-CM

## 2018-03-09 ENCOUNTER — Ambulatory Visit: Payer: Medicare Other | Admitting: Internal Medicine

## 2018-03-10 ENCOUNTER — Encounter: Payer: Self-pay | Admitting: *Deleted

## 2018-03-11 ENCOUNTER — Ambulatory Visit (INDEPENDENT_AMBULATORY_CARE_PROVIDER_SITE_OTHER): Payer: Medicare Other | Admitting: *Deleted

## 2018-03-11 DIAGNOSIS — Z5181 Encounter for therapeutic drug level monitoring: Secondary | ICD-10-CM | POA: Diagnosis not present

## 2018-03-11 DIAGNOSIS — I480 Paroxysmal atrial fibrillation: Secondary | ICD-10-CM

## 2018-03-11 LAB — POCT INR: INR: 3.7 — AB (ref 2.0–3.0)

## 2018-03-11 NOTE — Patient Instructions (Signed)
Hold coumadin tonight then decrease dose to 4mg  daily (1 tab) except 2mg  (1/2 tab) on Sundays, Tuesdays and Thursdays Recheck in 3 weeks

## 2018-03-12 DIAGNOSIS — Z6835 Body mass index (BMI) 35.0-35.9, adult: Secondary | ICD-10-CM | POA: Diagnosis not present

## 2018-03-12 DIAGNOSIS — R21 Rash and other nonspecific skin eruption: Secondary | ICD-10-CM | POA: Diagnosis not present

## 2018-03-17 ENCOUNTER — Ambulatory Visit: Payer: Medicare Other | Admitting: Internal Medicine

## 2018-03-18 DIAGNOSIS — E1165 Type 2 diabetes mellitus with hyperglycemia: Secondary | ICD-10-CM | POA: Diagnosis not present

## 2018-03-19 ENCOUNTER — Other Ambulatory Visit: Payer: Self-pay | Admitting: Pulmonary Disease

## 2018-03-23 ENCOUNTER — Telehealth: Payer: Self-pay | Admitting: Pulmonary Disease

## 2018-03-23 NOTE — Telephone Encounter (Signed)
Called and spoke to [place] with [facility], who is call for update on Provigil 200mg . Rx last refilled on 10/10/16 #30 with 5 refills.  Pt last seen 11/07/17 with recall placed for 05/06/18.  VS please advise on refill. Thanks.

## 2018-03-24 MED ORDER — MODAFINIL 200 MG PO TABS: 200.0000 mg | ORAL_TABLET | Freq: Every day | ORAL | 5 refills | Status: DC

## 2018-03-24 NOTE — Telephone Encounter (Signed)
Okay to send refill for modafinil 200 mg daily, #30 with 5 refills.

## 2018-03-24 NOTE — Telephone Encounter (Signed)
Called pt's pharmacy and spoke with Melilssa to give her a verbal of pt's modafinil Rx instructions.  Called pt but unable to reach her. Left a detailed message for pt in regards to the refill being taken care of for her.  Nothing further needed.

## 2018-03-25 ENCOUNTER — Telehealth: Payer: Self-pay

## 2018-03-25 NOTE — Telephone Encounter (Signed)
RX request received for Ulticare pen needles 19mm.  Last seen 01/27/17, please advise.

## 2018-03-25 NOTE — Telephone Encounter (Signed)
Okay to send him for 3 months but she will need to make an appointment within this timeframe.

## 2018-03-27 MED ORDER — INSULIN PEN NEEDLE 32G X 4 MM MISC: 5 refills | Status: DC

## 2018-03-27 NOTE — Telephone Encounter (Signed)
RX sent

## 2018-04-01 ENCOUNTER — Other Ambulatory Visit: Payer: Self-pay

## 2018-04-01 NOTE — Patient Outreach (Signed)
[facility]) Care Management  04/01/2018  Denise Freeman [facility] Jan 18, 1951 [id number]    1st telephone call to the patient for assessment. No answer.  HIPAA compliant voicemail left with contact information.  Plan: RN Health Coach will make another outreach attempt to the patient within thirty business days.   Denise Arms RN, BSN, [place] Direct Dial:  [phone]  Fax: [phone]

## 2018-04-08 ENCOUNTER — Ambulatory Visit (INDEPENDENT_AMBULATORY_CARE_PROVIDER_SITE_OTHER): Payer: Medicare Other | Admitting: Internal Medicine

## 2018-04-08 ENCOUNTER — Encounter: Payer: Self-pay | Admitting: Internal Medicine

## 2018-04-08 VITALS — BP 126/60 | HR 66 | Ht 59.0 in | Wt 179.0 lb

## 2018-04-08 DIAGNOSIS — I4821 Permanent atrial fibrillation: Secondary | ICD-10-CM | POA: Diagnosis not present

## 2018-04-08 DIAGNOSIS — I428 Other cardiomyopathies: Secondary | ICD-10-CM | POA: Diagnosis not present

## 2018-04-08 DIAGNOSIS — N183 Chronic kidney disease, stage 3 unspecified: Secondary | ICD-10-CM

## 2018-04-08 DIAGNOSIS — I5022 Chronic systolic (congestive) heart failure: Secondary | ICD-10-CM

## 2018-04-08 NOTE — Patient Instructions (Signed)
Medication Instructions:  Continue current medications If you need a refill on your cardiac medications before your next appointment, please call your pharmacy.   Testing/Procedures: Your physician has requested that you have an echocardiogram. Echocardiography is a painless test that uses sound waves to create images of your heart. It provides your doctor with information about the size and shape of your heart and how well your heart's chambers and valves are working. This procedure takes approximately one hour. There are no restrictions for this procedure. -- done at [address] - [address]  Follow-Up: At [facility], you and your health needs are our priority.  As part of our continuing mission to provide you with exceptional heart care, we have created designated Provider Care Teams.  These Care Teams include your primary Cardiologist (physician) and Advanced Practice Providers (APPs -  Physician Assistants and Nurse Practitioners) who all work together to provide you with the care you need, when you need it. You will need a follow up appointment in 6 months.  Please call our office 2 months in advance to schedule this appointment.  You may see [name] or one of the following Advanced Practice Providers on your designated Care Team: Almyra Deforest, [place] . Fabian Sharp, PA-C

## 2018-04-08 NOTE — Progress Notes (Signed)
OFFICE NOTE  Chief Complaint:  No complaints  Primary Care Physician: Celene Squibb, MD  HPI:  Denise Freeman is a pleasant 67 year old female who is establishing cardiac care today. She is accompanied by her husband and they recently moved here from [place], [place]. She has a history of severe COPD on home oxygen. She also has a history of stress-induced cardiomyopathy in the past. She has since had recovery of her EF. She's had numerous cardiac catheterizations. None of which showed obstructive coronary disease. Her last cardiac catheterization was in October 2014 which demonstrated no significant coronary disease. This was after a small abnormality was noted in the apex suggestive of ischemia on a nuclear stress test. She does have a history of permanent atrial fibrillation on Coumadin. She will need to have her INRs followed here. Unfortunate she has not had her INR checked in over 9 weeks and it was assessed today and was low. We will need to adjust her medication. She will be established in our anticoagulation clinic. Blood pressure looks well controlled today. She is on cholesterol medication.  I saw Mrs. Rackers back today in the office. Unfortunate she was hospitalized in December for worsening shortness of breath and probable pneumonia with a mild diastolic heart failure exacerbation. She was given IV diuretics and her Lasix was increased to 40 mg 3 times a day. She's also taking metolazone 3 times weekly. She continues to complain of leg edema which is mostly dependent. She has significant shortness of breath and CO2 retention and an appointment with a pulmonologist is pending.  Mrs. Testerman returned today back in the office. Her main complaints are a number of excoriations and weeping lesions on her face and arms. She apparently is going to see a dermatologist about this. She's also had worsening leg swelling and shortness of breath. Her weight is now up about 8 pounds since her last  visit. She was instructed to take Lasix 40 mg 3 times a day but apparently she is taking it twice a day. She is taking the metolazone 3 times a week. She is wearing compression stockings which I had advised and seems that this helps her swelling.  Mrs. [place] returns today and reports a big improvement in her leg swelling. She's currently on Lasix 60 mg twice daily and metolazone 3x weekly. Weight is come down and swelling is improved. Her BNP was over 250 and is come down to about 125.  I saw Mrs. Almond back today in follow-up. She's been successful losing over 15 pounds which I congratulated her on. Her breathing continues to be fairly difficult. She is using BiPAP at night. She's been in the ER a few times for mostly difficulty breathing and COPD exacerbation. She tells me the other day she was in church and had an episode of sharp chest pain in her left anterior chest and left scapular area. She was quite upset and emotional today time. Recently she's been the target of possibly a scam, so that she is under a lot of stress. She was upset that she did not have anything to take for her chest discomfort. I tried to reassure her that her coronary arteries have look normal with multiple catheterizations. She does have some diastolic dysfunction I suspect her LVEDP goes up when she gets upset causing her chest discomfort.  10/31/2015  Mrs. Barnaby was seen today in follow-up from her recent hospitalization. She was seen in January for acute GI bleeding thought to  be upper GI bleeding and required transfusion for hemoglobin of 5. She was evaluated by Dr. Silvano Rusk with [facility] who recommended an EGD. Unfortunately, it looks like she was discharged the next day, possibly [place] after she removed her IV and stated she needed to go home. She relates that she never saw the specialist and does not recall seeing Dr. Carlean Purl. Since that time she's continued to have blood in her stool.  She was taken off of Xarelto and not restarted on anticoagulation. She does have a high CHADSVASC score of 5, suggesting a higher risk of stroke.  08/07/2016  Mrs. Mchatton returns for follow-up. She underwent GI work-up with no clear bleeding source. She does not want to be on anticoagulation for fear of another stroke. She attributes her vision loss and word-finding difficulty to her prior stroke on Xarelto. She was previously on warfarin in the distant past, but reported having difficulty maintaining a therapeutic INR. We discussed the possibility of a Watchman device today - she seems interested in this. I explained the procedure and will refer her to Dr. Rayann Heman for evaluation.  5/30/20181  Mrs. Bridgeforth was seen today in follow-up. She recently saw Ignacia Bayley, NP, after hospital follow-up. Given her recurrent  cardiomyopathy with EF 30-35%, he recommended starting her on BiDil. This is based on recent lab work demonstrating significant renal insufficiency however repeat lab work shows normal creatinine. She recently has had 10 pound weight gain and feels that it is related to a decrease in her metolazone to a half tablet daily. Previously had had her on 80 mg Lasix twice a day and metolazone 2.5 mg daily.  12/09/2016  Mrs. Krizan returns today for follow-up. She reports some improvement in her fatigue and breathing. I placed her on both metolazone as well as a losartan. Today her blood pressures noted to be low at 90/50 and she is down about 6 pounds with a diuretic. Recently lab work indicated a small increase in her creatinine up to 1.35 with a baseline of about 1.0-1.1. She says a diuretic is doing good job of keeping her swelling off, however I wonder she may be a little bit over diuresis. In addition she may not be tolerating the ARB.  03/12/2017  Mrs. Campos was seen today in follow-up. Her weight is now up about 13 pounds. She denies any worsening swelling. She thinks that it may be due to eating  her large meals at night. Her blood sugars have not been well controlled and apparently there is been some therapeutic disagreements between her an endocrinologist, she is been referred to another endocrinologist in [place]. She remains in permanent A. fib which is rate controlled. She's currently taking metolazone daily in addition to Lasix 80 mg twice a day. She denies any worsening shortness of breath or chest pain. She remains on home oxygen for COPD which is severe.   06/13/2017  Jasmina returns today for follow-up. She has had recurrent edema. See telephone notes for details. Weight was up 10 lbs. Her lasix was increased to 80 mg TID in addition to her 2.5 mg metolazone daily - apparently, her pharmacy is packaging this for her to take at night, but it would be better in the morning. Recently labs indicate creatinine of 1.6, therefore, we had to reduce her lasix. Weight today is 181 which is back at her dry weight. She also reports stopping her lipitor 2 weeks ago because of memory problems which she says is better.  12/03/2017  Rakel returns today for follow-up.  We were contacted by her advanced home care nurse who noted that she has gained significant weight.  In June 2019 weight was 173 then up to 177 and now 182 about a month later.  She reports increasing abdominal girth but denies any lower extremity edema.  She is short of breath but it is consistent with her home shortness of breath.  She is also been on steroids for the past 2 months for gout.  She finished her last dose today and is transitioning over to allopurinol and colchicine.  In addition she has had worsening renal function.  She is scheduled for renal ultrasound in June and has a follow-up appointment with Dr. Lowanda Foster in [place] in July.  She is also complaining of bilateral leg pain.  She says it is somewhat worse when she walks and improved at rest.  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  04/08/2018  Velva Harman is seen today for follow-up.  Fortunately she is without complaints.  Her weight is stable if not declined a few pounds since I last saw her.  She seems to be doing well on this combination of 3 times daily Lasix as well as metolazone.  She is followed by her nephrologist in [place].  Renal function has stayed fairly stable.  She denies any worsening shortness of breath.  Blood pressure is well controlled today 126/60.  PMHx:  Past Medical History:  Diagnosis Date  . Allergy   . Anemia   . Anxiety   . Asthma   . Atrial fibrillation ([place])   . Bipolar 1 disorder ([place])   . 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 Cholelithiasis    a. 09/2016 s/p Lap Chole.  . Chronic bronchitis ([place])   . Chronic combined systolic and diastolic congestive heart failure ([place])    a. 09/2016 Echo: EF 30-35%.  . Colon polyps   . COPD (chronic obstructive pulmonary disease) ([place])   . Depression   . Family history of adverse reaction to anesthesia    Uncle was positive for malignant hyperthermia; patient had testing done and was negative.  . Fibromyalgia   . GERD (gastroesophageal reflux disease)   . Gout   . High cholesterol   . History of blood transfusion 06/2015   "bleeding from my rectum"  . History of hiatal hernia   . HOH (hard of hearing)   . Hx of colonic polyps 03/21/2016   3 small adenomas no recall - co-morbidities  . Neuropathy    Disc Back   . NICM (nonischemic cardiomyopathy) ([place])    a. Previously worked up in [place], [place]-->low EF with subsequent recovery.  Multiple caths (last ~ 2014 per pt report)--reportedly nl cors;  b. 09/2016 Echo: EF 30-35%, antsept/apical HK, mild MR, mildly dil LA, mod dil RA;  c. 09/2016 Lexi MV: EF 26%, glob HK, sept DK, med size, mod intensity fixed septal defect - BBB/PVC related artifact, no ischemia.  . On home oxygen therapy    "3L; 24/7" (03/14/2016)  . OSA  treated with BiPAP    uses biPAP, 10 (03/14/2016)  . Osteoarthritis   . Oxygen deficiency   . Pneumonia   . Type II diabetes mellitus ([place])     Past Surgical History:  Procedure Laterality Date  . APPENDECTOMY     "they busted"  . bladder  stimulator     pt states, "it cannot be turned off; it's in my right hip; dead battery so it's not working anymore". (03/14/2016)  . BLADDER SUSPENSION     2003, 2006 and 2010  . CATARACT EXTRACTION W/PHACO Right 11/29/2014   Procedure: CATARACT EXTRACTION PHACO AND INTRAOCULAR LENS PLACEMENT (IOC);  Surgeon: Rutherford Guys, MD;  Location: [facility];  Service: Ophthalmology;  Laterality: Right;  CDE:3.81  . CATARACT EXTRACTION W/PHACO Left 12/13/2014   Procedure: CATARACT EXTRACTION PHACO AND INTRAOCULAR LENS PLACEMENT (IOC);  Surgeon: Rutherford Guys, MD;  Location: [facility];  Service: Ophthalmology;  Laterality: Left;  CDE:6.59  . CERVICAL DISC SURGERY N/A 2009   4, 6, and 7 cervical disc replaced  . CHOLECYSTECTOMY N/A 09/13/2016   Procedure: LAPAROSCOPIC CHOLECYSTECTOMY;  Surgeon: Rolm Bookbinder, MD;  Location: [facility];  Service: General;  Laterality: N/A;  . COLONOSCOPY WITH PROPOFOL N/A 03/15/2016   Procedure: COLONOSCOPY WITH PROPOFOL;  Surgeon: Gatha Mayer, MD;  Location: [facility];  Service: Endoscopy;  Laterality: N/A;  . ESOPHAGOGASTRODUODENOSCOPY (EGD) WITH PROPOFOL N/A 03/15/2016   Procedure: ESOPHAGOGASTRODUODENOSCOPY (EGD) WITH PROPOFOL;  Surgeon: Gatha Mayer, MD;  Location: [facility];  Service: Endoscopy;  Laterality: N/A;  . HEEL SPUR SURGERY Bilateral   . HERNIA REPAIR    . I&D EXTREMITY Right 06/13/2015   Procedure: MINOR IRRIGATION AND DEBRIDEMENT EXTREMITY REMOVAL OF NAIL;  Surgeon: Daryll Brod, MD;  Location: [facility];  Service: Orthopedics;  Laterality: Right;  . TUBAL LIGATION    . UMBILICAL HERNIA REPAIR     w/mesh    FAMHx:  Family History  Problem Relation Age of Onset  . Heart disease Mother   .  COPD Mother   . Diabetes Mother   . Breast cancer Mother   . Heart disease Father   . Hyperlipidemia Father   . COPD Sister   . Heart disease Sister   . Diabetes Sister   . Heart disease Maternal Grandmother   . Cancer Maternal Grandmother        stomach  . Cancer Maternal Grandfather        lung   . Bipolar disorder Brother     SOCHx:   reports that she quit smoking about 29 years ago. Her smoking use included cigarettes. She has a 28.00 pack-year smoking history. She has never used smokeless tobacco. She reports that she does not drink alcohol or use drugs.  ALLERGIES:  Allergies  Allergen Reactions  . Xarelto [Rivaroxaban] Other (See Comments)    Internal bleeding  . Ancef [Cefazolin] Nausea And Vomiting  . Levaquin [Levofloxacin In D5w] Other (See Comments)    "afib"  . Lyrica [Pregabalin] Hives  . Tamiflu [Oseltamivir Phosphate] Other (See Comments)    "water blisters"  . Zoloft [Sertraline Hcl] Other (See Comments)    Jaw problems, jittery  . Augmentin [Amoxicillin-Pot Clavulanate] Itching  . Ciprofloxacin Itching and Nausea And Vomiting  . Haldol [Haloperidol] Other (See Comments)    Restless leg  . Nsaids Diarrhea  . Penicillins Itching, Nausea And Vomiting and Rash    Has patient had a PCN reaction causing immediate rash, facial/tongue/throat swelling, SOB or lightheadedness with hypotension: Yes Has patient had a PCN reaction causing severe rash involving mucus membranes or skin necrosis: No Has patient had a PCN reaction that required hospitalization No Has patient had a PCN reaction occurring within the last 10 years: Yes If all of the above answers are "NO", then may proceed with Cephalosporin  use.  . Topamax [Topiramate] Nausea Only    ROS: Pertinent items noted in HPI and remainder of comprehensive ROS otherwise negative.  HOME MEDS: Current Outpatient Medications  Medication Sig Dispense Refill  . albuterol (PROVENTIL) (2.5 MG/3ML) 0.083% nebulizer  solution USE (1) IN NEBULIZER EVERY 6 HOURS AS NEEDED FOR SHORTNESS OF BREATH. 75 mL 2  . allopurinol (ZYLOPRIM) 100 MG tablet TAKE 1 TABLET BY MOUTH ONCE DAILY. 30 tablet 0  . cetirizine (ZYRTEC) 10 MG tablet Take 1 tablet (10 mg total) by mouth at bedtime. 30 tablet 11  . cholecalciferol (VITAMIN D) 400 units TABS tablet Take 1 tablet (400 Units total) by mouth 2 (two) times daily. 180 each 3  . colchicine 0.6 MG tablet Take 1 tablet (0.6 mg total) by mouth daily. 30 tablet 1  . colchicine 0.6 MG tablet Take 1 tablet (0.6 mg total) by mouth daily. If have stomach upset, can reduce to every other day. 30 tablet 0  . docusate sodium (COLACE) 100 MG capsule Take 100 mg by mouth daily.    Marland Kitchen escitalopram (LEXAPRO) 10 MG tablet Take 1 tablet (10 mg total) by mouth at bedtime. 90 tablet 0  . fenofibrate 54 MG tablet TAKE 1 TABLET BY MOUTH ONCE DAILY. 28 tablet 3  . ferrous sulfate 324 (65 Fe) MG TBEC Take 1 tablet by mouth daily.    . furosemide (LASIX) 40 MG tablet Take 2 tablets (80 mg total) by mouth 3 (three) times daily. 180 tablet 5  . Insulin Glargine (BASAGLAR KWIKPEN) 100 UNIT/ML SOPN Inject 0.6 mLs (60 Units total) into the skin at bedtime. 15 mL 2  . insulin lispro (HUMALOG KWIKPEN) 100 UNIT/ML KiwkPen Inject 0.1-0.16 mLs (10-16 Units total) into the skin 3 (three) times daily before meals. 15 mL 2  . Insulin Pen Needle 32G X 4 MM MISC Use to inject insulin 5 times daily 500 each 5  . ipratropium (ATROVENT) 0.02 % nebulizer solution USE (1) IN NEBULIZER EVERY 6 HOURS AS NEEDED FOR SHORTNESS OF BREATH. 75 mL 2  . metolazone (ZAROXOLYN) 2.5 MG tablet Take 1 tablet (2.5 mg total) by mouth daily as needed (to be used as needed for edema). Take 30 minutes prior to morning LASIX 30 tablet 3  . metoprolol succinate (TOPROL-XL) 25 MG 24 hr tablet TAKE 1 TABLET BY MOUTH TWICE DAILY 56 tablet 5  . mirtazapine (REMERON) 15 MG tablet Take 1 tablet (15 mg total) by mouth at bedtime. 90 tablet 0  .  modafinil (PROVIGIL) 200 MG tablet Take 1 tablet (200 mg total) by mouth daily. 30 tablet 5  . mometasone (NASONEX) 50 MCG/ACT nasal spray Place 1 spray into the nose daily. 17 g 11  . montelukast (SINGULAIR) 10 MG tablet TAKE ONE TABLET BY MOUTH AT BEDTIME. 30 tablet 6  . mupirocin cream (BACTROBAN) 2 % Apply 1 application topically 2 (two) times daily. 15 g 0  . nitroGLYCERIN (NITROSTAT) 0.4 MG SL tablet Place 1 tablet (0.4 mg total) under the tongue every 5 (five) minutes as needed for chest pain. 25 tablet 3  . nystatin (NYSTATIN) powder Apply topically 2 (two) times daily. Apply under breasts 15 g 3  . oxyCODONE-acetaminophen (PERCOCET/ROXICET) 5-325 MG tablet Take 1 tablet by mouth 2 (two) times daily as needed for severe pain. 60 tablet 0  . OXYGEN Inhale 3 L into the lungs continuous. 3 liters 24/7    . pantoprazole (PROTONIX) 20 MG tablet TAKE (1) TABLET BY MOUTH TWICE A DAY  BEFORE MEALS. (BREAKFAST AND SUPPER) 56 tablet 0  . predniSONE (DELTASONE) 5 MG tablet Take 1 tablet (5 mg total) by mouth daily with breakfast. 30 tablet 2  . PRESCRIPTION MEDICATION Inhale into the lungs See admin instructions. Use BIPAP every time laying down    . primidone (MYSOLINE) 50 MG tablet TAKE ONE TABLET BY MOUTH AT BEDTIME. 30 tablet 5  . Probiotic Product (PROBIOTIC PO) Take 1 tablet by mouth at bedtime.    . triamcinolone lotion (KENALOG) 0.1 % Apply 1 application topically 3 (three) times daily. For up to 14 days and as needed 60 mL 1  . TRUE METRIX BLOOD GLUCOSE TEST test strip USE TO TEST BLOOD SUGAR AS DIRECTED. 50 each 2  . vitamin A 10000 UNIT capsule Take 10,000 Units by mouth every morning.    . warfarin (COUMADIN) 4 MG tablet Take 1 tablet daily except 1/2 tablet on Thursdays 30 tablet 4  . losartan (COZAAR) 25 MG tablet Take 0.5 tablets (12.5 mg total) by mouth daily. 45 tablet 2   No current facility-administered medications for this visit.     LABS/IMAGING: No results found for this or  any previous visit (from the past 48 hour(s)). No results found.  VITALS: BP 126/60 (BP Location: Left Arm, Patient Position: Sitting, Cuff Size: Normal)   Pulse 66   Ht 4\' 11"  (1.499 m)   Wt 179 lb (81.2 kg)   BMI 36.15 kg/m   EXAM: General appearance: alert, appears older than stated age, no distress, morbidly obese and On chronic nasal cannula oxygen Neck: no carotid bruit and no JVD Lungs: diminished breath sounds bilaterally Heart: irregularly irregular rhythm Abdomen: soft, non-tender; bowel sounds normal; no masses,  no organomegaly Extremities: extremities normal, atraumatic, no cyanosis or edema and varicose veins noted Pulses: 1+ DP/PT pulses Skin: Skin color, texture, turgor normal. No rashes or lesions Neurologic: Grossly normal Psych: Pleasant  EKG: A. fib at 66, LAFB, poor R wave progression anteriorly-personally reviewed  ASSESSMENT: 1. Permanent atrial fibrillation - not on anticoagulation due to recent GI bleeding and vision loss attributed to stroke 2. Acute on Chronic combined congestive heart failure - EF 30-35% 3. History of stress-induced cardiomyopathy 4. No significant obstructive coronary disease after multiple catheterizations in 1996, 99, 2007 and 2014. 5. Fibromyalgia 6. Bipolar 1 disorder 7. Dyslipidemia 8. Hypertension 9. Severe COPD on home oxygen 10. Obstructive sleep apnea on CPAP 11. Morbid obesity - with weight loss recently 12. DNR  PLAN: 1.   Mrs. Slemmer has remained stable without any significant new increases in weight in fact she is lost a few pounds and seems to be tolerating her current dose of diuretics.  Her renal function is reportedly stable.  She is maintained on oxygen for advanced COPD.  She is in permanent A. fib but not anticoagulated due to GI bleeding.  We will plan to continue her current medications without changes today.  Has not had reassessment of LVEF in some time.  We will get another echocardiogram to see if her EF  has remained stable or improved.  If not we could consider switching losartan over to [facility].  Follow-up with me in 6 months.  Pixie Casino, MD, [facility], [place] Director of the Advanced Lipid Disorders &  Cardiovascular Risk Reduction Clinic Diplomate of the American Board of Clinical Lipidology Attending Cardiologist  Direct Dial: [phone]  Fax: [phone]  Website:  [email or web address]   Pixie Casino 04/08/2018,  11:14 AM

## 2018-04-10 ENCOUNTER — Encounter: Payer: Self-pay | Admitting: Internal Medicine

## 2018-04-14 ENCOUNTER — Other Ambulatory Visit: Payer: Medicare Other | Admitting: Women's Health

## 2018-04-16 ENCOUNTER — Other Ambulatory Visit: Payer: Self-pay | Admitting: Internal Medicine

## 2018-04-16 ENCOUNTER — Ambulatory Visit: Payer: Self-pay

## 2018-04-16 ENCOUNTER — Other Ambulatory Visit (HOSPITAL_COMMUNITY): Payer: Self-pay

## 2018-04-17 ENCOUNTER — Other Ambulatory Visit (HOSPITAL_COMMUNITY): Payer: Self-pay

## 2018-04-22 ENCOUNTER — Ambulatory Visit (INDEPENDENT_AMBULATORY_CARE_PROVIDER_SITE_OTHER): Payer: Medicare Other | Admitting: *Deleted

## 2018-04-22 ENCOUNTER — Other Ambulatory Visit: Payer: Self-pay | Admitting: "Endocrinology

## 2018-04-22 DIAGNOSIS — I480 Paroxysmal atrial fibrillation: Secondary | ICD-10-CM | POA: Diagnosis not present

## 2018-04-22 DIAGNOSIS — Z5181 Encounter for therapeutic drug level monitoring: Secondary | ICD-10-CM | POA: Diagnosis not present

## 2018-04-22 LAB — POCT INR: INR: 1.6 — AB (ref 2.0–3.0)

## 2018-04-22 NOTE — Patient Instructions (Signed)
Increase coumadin to 1 tablet daily except 1/2 tablet on Tuesdays Recheck in 3 weeks

## 2018-04-27 ENCOUNTER — Telehealth: Payer: Self-pay | Admitting: Pulmonary Disease

## 2018-04-27 DIAGNOSIS — J449 Chronic obstructive pulmonary disease, unspecified: Secondary | ICD-10-CM

## 2018-04-27 NOTE — Telephone Encounter (Signed)
Okay to send order for new nebulizer machine.  Can use diagnosis of COPD with asthma.

## 2018-04-27 NOTE — Telephone Encounter (Signed)
Called and spoke with patient, she stated that she is needing a new nebulizer machine since hers is now broke. Patient is requesting a new one.   VS please advise, thank you.

## 2018-04-28 DIAGNOSIS — E782 Mixed hyperlipidemia: Secondary | ICD-10-CM | POA: Diagnosis not present

## 2018-04-28 DIAGNOSIS — J449 Chronic obstructive pulmonary disease, unspecified: Secondary | ICD-10-CM | POA: Diagnosis not present

## 2018-04-28 DIAGNOSIS — G8929 Other chronic pain: Secondary | ICD-10-CM | POA: Diagnosis not present

## 2018-04-28 DIAGNOSIS — E559 Vitamin D deficiency, unspecified: Secondary | ICD-10-CM | POA: Diagnosis not present

## 2018-04-28 DIAGNOSIS — Z6835 Body mass index (BMI) 35.0-35.9, adult: Secondary | ICD-10-CM | POA: Diagnosis not present

## 2018-04-28 DIAGNOSIS — R21 Rash and other nonspecific skin eruption: Secondary | ICD-10-CM | POA: Diagnosis not present

## 2018-04-28 DIAGNOSIS — K219 Gastro-esophageal reflux disease without esophagitis: Secondary | ICD-10-CM | POA: Diagnosis not present

## 2018-04-28 DIAGNOSIS — J9610 Chronic respiratory failure, unspecified whether with hypoxia or hypercapnia: Secondary | ICD-10-CM | POA: Diagnosis not present

## 2018-04-28 DIAGNOSIS — K5900 Constipation, unspecified: Secondary | ICD-10-CM | POA: Diagnosis not present

## 2018-04-28 DIAGNOSIS — M109 Gout, unspecified: Secondary | ICD-10-CM | POA: Diagnosis not present

## 2018-04-28 DIAGNOSIS — R251 Tremor, unspecified: Secondary | ICD-10-CM | POA: Diagnosis not present

## 2018-04-28 DIAGNOSIS — Z6836 Body mass index (BMI) 36.0-36.9, adult: Secondary | ICD-10-CM | POA: Diagnosis not present

## 2018-04-28 NOTE — Telephone Encounter (Signed)
Patient returned phone call; contact # [phone]

## 2018-04-28 NOTE — Telephone Encounter (Signed)
Spoke with the patient she is aware and the order has been sent nothing further is needed at this time.

## 2018-04-28 NOTE — Telephone Encounter (Signed)
Attempted to contact pt. I did not receive an answer. I have left a message for pt to return our call.  

## 2018-04-29 ENCOUNTER — Other Ambulatory Visit: Payer: Self-pay
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
## 2018-05-01 ENCOUNTER — Ambulatory Visit: Payer: Self-pay

## 2018-05-05 DIAGNOSIS — Z6835 Body mass index (BMI) 35.0-35.9, adult: Secondary | ICD-10-CM | POA: Diagnosis not present

## 2018-05-05 DIAGNOSIS — J449 Chronic obstructive pulmonary disease, unspecified: Secondary | ICD-10-CM | POA: Diagnosis not present

## 2018-05-05 DIAGNOSIS — K210 Gastro-esophageal reflux disease with esophagitis: Secondary | ICD-10-CM | POA: Diagnosis not present

## 2018-05-05 DIAGNOSIS — R21 Rash and other nonspecific skin eruption: Secondary | ICD-10-CM | POA: Diagnosis not present

## 2018-05-05 DIAGNOSIS — M109 Gout, unspecified: Secondary | ICD-10-CM | POA: Diagnosis not present

## 2018-05-05 DIAGNOSIS — J441 Chronic obstructive pulmonary disease with (acute) exacerbation: Secondary | ICD-10-CM | POA: Diagnosis not present

## 2018-05-05 DIAGNOSIS — E559 Vitamin D deficiency, unspecified: Secondary | ICD-10-CM | POA: Diagnosis not present

## 2018-05-05 DIAGNOSIS — J9610 Chronic respiratory failure, unspecified whether with hypoxia or hypercapnia: Secondary | ICD-10-CM | POA: Diagnosis not present

## 2018-05-05 DIAGNOSIS — I4891 Unspecified atrial fibrillation: Secondary | ICD-10-CM | POA: Diagnosis not present

## 2018-05-05 DIAGNOSIS — E11620 Type 2 diabetes mellitus with diabetic dermatitis: Secondary | ICD-10-CM | POA: Diagnosis not present

## 2018-05-05 DIAGNOSIS — E1122 Type 2 diabetes mellitus with diabetic chronic kidney disease: Secondary | ICD-10-CM | POA: Diagnosis not present

## 2018-05-05 DIAGNOSIS — Z23 Encounter for immunization: Secondary | ICD-10-CM | POA: Diagnosis not present

## 2018-05-05 DIAGNOSIS — N183 Chronic kidney disease, stage 3 (moderate): Secondary | ICD-10-CM | POA: Diagnosis not present

## 2018-05-05 DIAGNOSIS — G8929 Other chronic pain: Secondary | ICD-10-CM | POA: Diagnosis not present

## 2018-05-05 DIAGNOSIS — I5020 Unspecified systolic (congestive) heart failure: Secondary | ICD-10-CM | POA: Diagnosis not present

## 2018-05-05 DIAGNOSIS — K5900 Constipation, unspecified: Secondary | ICD-10-CM | POA: Diagnosis not present

## 2018-05-05 DIAGNOSIS — M797 Fibromyalgia: Secondary | ICD-10-CM | POA: Diagnosis not present

## 2018-05-05 DIAGNOSIS — K219 Gastro-esophageal reflux disease without esophagitis: Secondary | ICD-10-CM | POA: Diagnosis not present

## 2018-05-05 DIAGNOSIS — E782 Mixed hyperlipidemia: Secondary | ICD-10-CM | POA: Diagnosis not present

## 2018-05-05 DIAGNOSIS — Z6836 Body mass index (BMI) 36.0-36.9, adult: Secondary | ICD-10-CM | POA: Diagnosis not present

## 2018-05-05 DIAGNOSIS — R251 Tremor, unspecified: Secondary | ICD-10-CM | POA: Diagnosis not present

## 2018-05-06 ENCOUNTER — Telehealth: Payer: Self-pay | Admitting: Pulmonary Disease

## 2018-05-06 ENCOUNTER — Other Ambulatory Visit: Payer: Self-pay | Admitting: Internal Medicine

## 2018-05-06 NOTE — Telephone Encounter (Signed)
Unsure what this message means.Denise Freeman, who states that she forwarded this message to triage because [facility] is not supposed to send her staff messages like this. Called [facility] at [phone] and spoke to [place], states that an office visit is needed with documentation in the office note stating that a new nebulizer is needed and being ordered.  I asked if the 11/07/17 OV can be addended but was told that this is too old.  Patient must come in for an office visit simply to document that she needs a replacement nebulizer..  No current rov scheduled with pt. lmtcb X1 to schedule patient an office visit.

## 2018-05-07 NOTE — Telephone Encounter (Signed)
Called and spoke with pt letting her know the message we received from [facility]. When I stated [facility], pt immediately stated to me that her DME is [facility] not [facility].  I stated to pt that we were needing her to come in for an OV due to the message we received in regards to her neb machine. Pt expressed understanding. appt has been scheduled for pt 05/22/18 at 2:15pm. Nothing further needed.

## 2018-05-08 ENCOUNTER — Other Ambulatory Visit: Payer: Self-pay

## 2018-05-08 ENCOUNTER — Ambulatory Visit (HOSPITAL_COMMUNITY): Payer: Medicare Other | Attending: Cardiology

## 2018-05-08 ENCOUNTER — Encounter (INDEPENDENT_AMBULATORY_CARE_PROVIDER_SITE_OTHER): Payer: Self-pay

## 2018-05-08 DIAGNOSIS — I428 Other cardiomyopathies: Secondary | ICD-10-CM

## 2018-05-13 ENCOUNTER — Ambulatory Visit (INDEPENDENT_AMBULATORY_CARE_PROVIDER_SITE_OTHER): Payer: Medicare Other | Admitting: *Deleted

## 2018-05-13 DIAGNOSIS — Z5181 Encounter for therapeutic drug level monitoring: Secondary | ICD-10-CM

## 2018-05-13 DIAGNOSIS — I480 Paroxysmal atrial fibrillation: Secondary | ICD-10-CM | POA: Diagnosis not present

## 2018-05-13 LAB — POCT INR: INR: 2.2 (ref 2.0–3.0)

## 2018-05-13 NOTE — Patient Instructions (Signed)
Continue coumadin 1 tablet daily except 1/2 tablet on Tuesdays Recheck in 4 weeks

## 2018-05-14 DIAGNOSIS — R809 Proteinuria, unspecified: Secondary | ICD-10-CM | POA: Diagnosis not present

## 2018-05-14 DIAGNOSIS — E873 Alkalosis: Secondary | ICD-10-CM | POA: Diagnosis not present

## 2018-05-14 DIAGNOSIS — D638 Anemia in other chronic diseases classified elsewhere: Secondary | ICD-10-CM | POA: Diagnosis not present

## 2018-05-14 DIAGNOSIS — N183 Chronic kidney disease, stage 3 (moderate): Secondary | ICD-10-CM | POA: Diagnosis not present

## 2018-05-22 ENCOUNTER — Ambulatory Visit: Payer: Self-pay | Admitting: Pulmonary Disease

## 2018-05-25 ENCOUNTER — Telehealth: Payer: Self-pay

## 2018-05-25 NOTE — Telephone Encounter (Signed)
LVM for Denise Freeman with [phone] at [facility] to return call. X1  Regarding VS message about patient having new nebulizer and supplies  VS notes from 08/01/15 and 01/30/16 mention her using a nebulizer. VS didn't mention in more recent notes because he figured it would be understood that she has been using a nebulizer.   If [facility] continues to give push back, then change to a different DME to get nebulizer set up.

## 2018-05-26 IMAGING — US US ABDOMEN LIMITED
1 series · 14 of 25 positions shown · non-contrast
Comparison: None.

CLINICAL DATA: Abdominal pain for 3 days

EXAM:
US ABDOMEN LIMITED - RIGHT UPPER QUADRANT

[Series 1: us abdomen limited · 0.26mm/px · 14 of 47 slices shown]
[im 1/47]
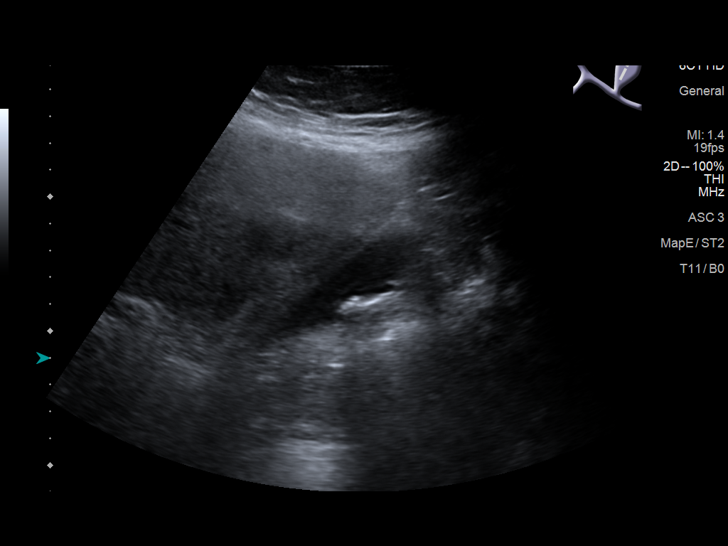
[im 4/47]
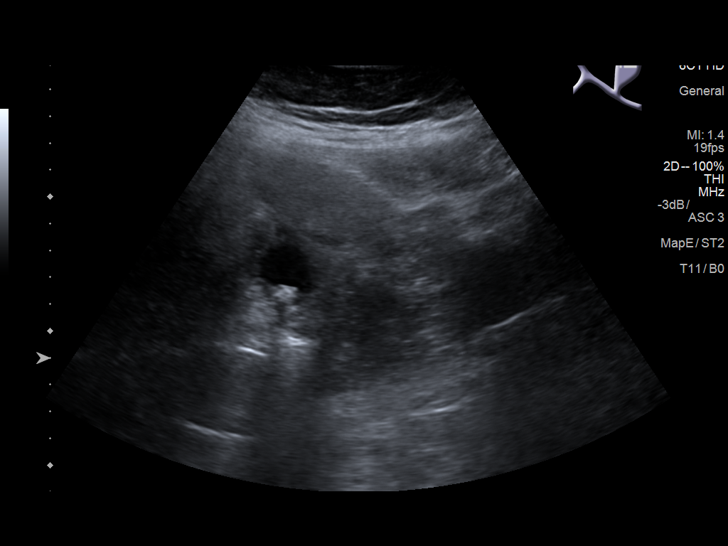
[im 8/47]
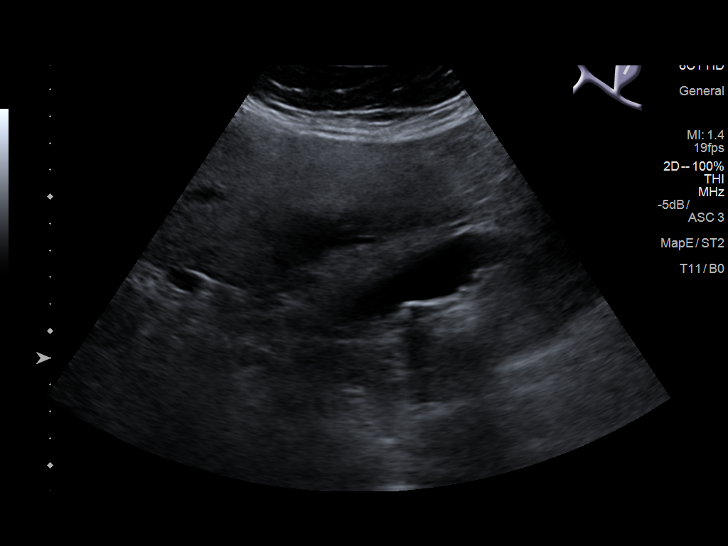
[im 12/47]
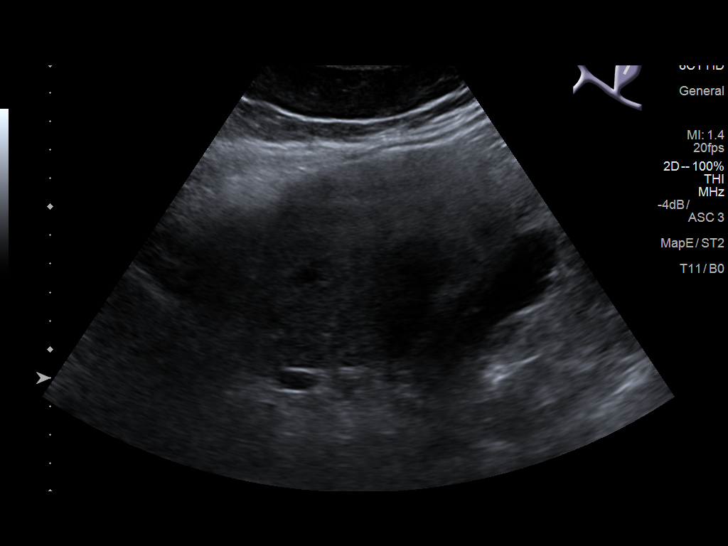
[im 16/47]
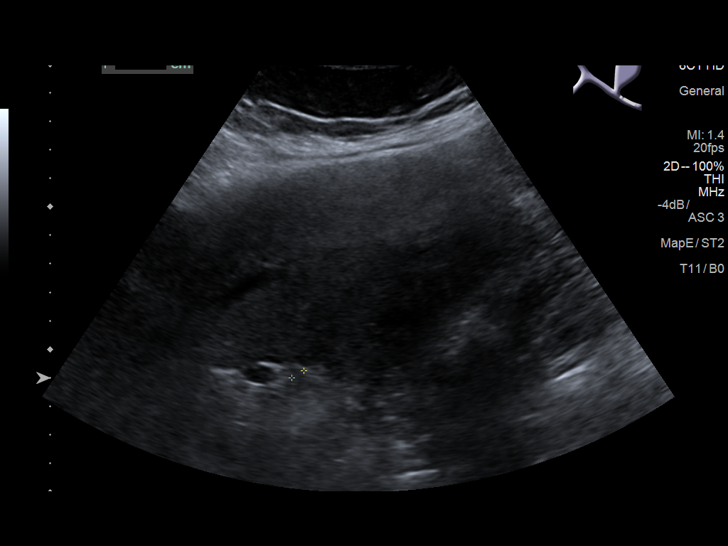
[im 18/47]
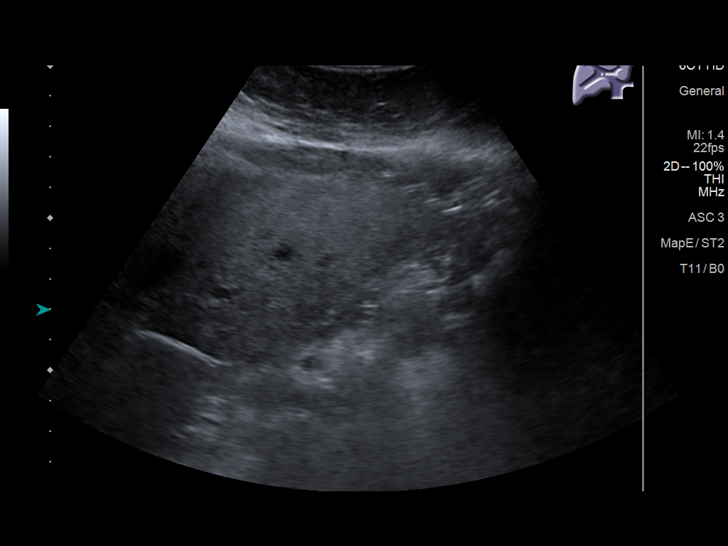
[im 22/47]
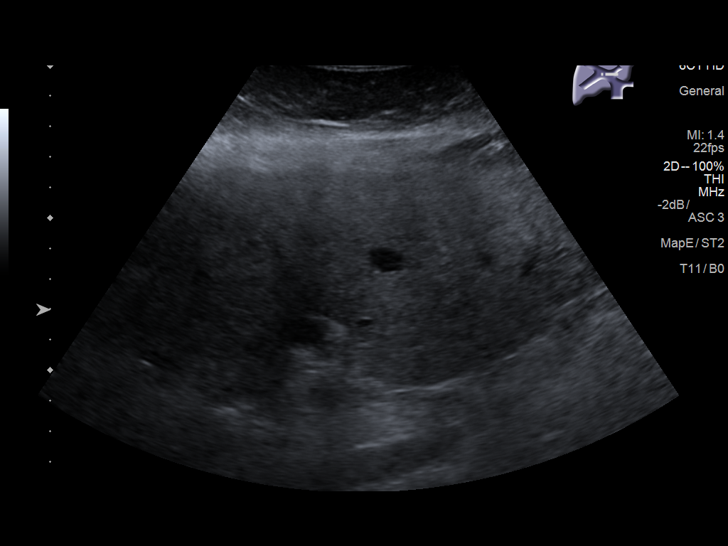
[im 25/47]
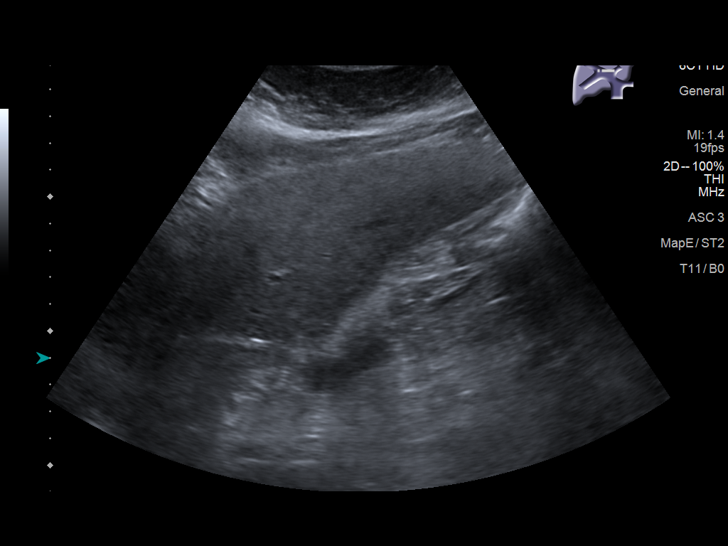
[im 29/47]
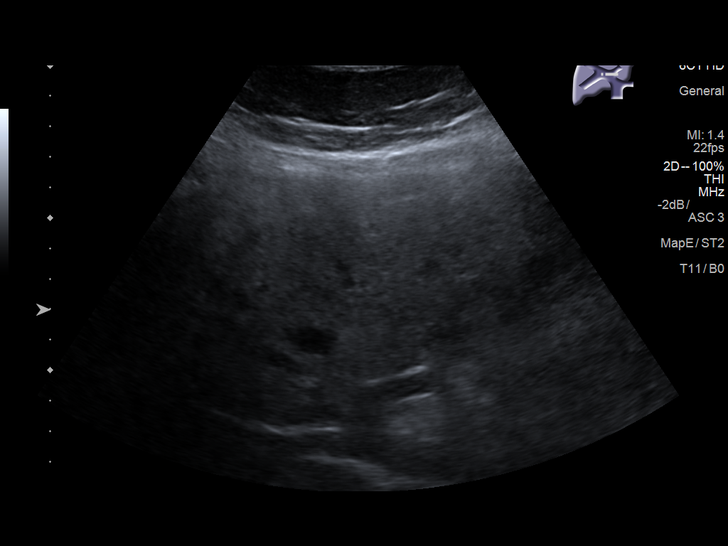
[im 31/47]
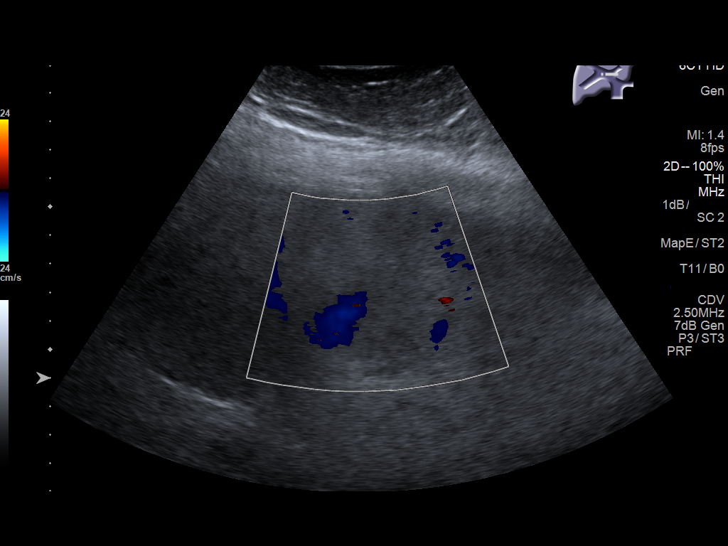
[im 35/47]
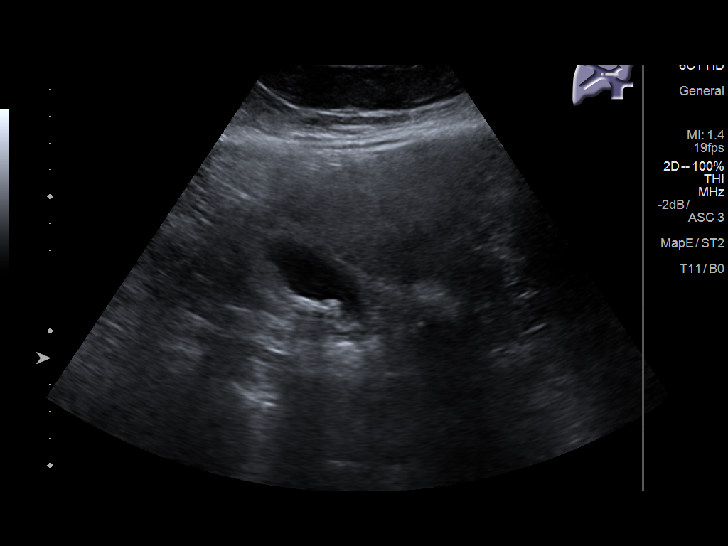
[im 39/47]
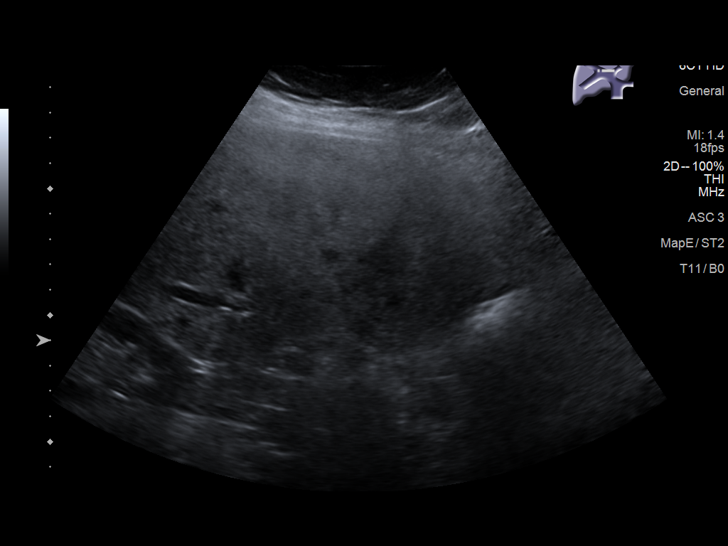
[im 43/47]
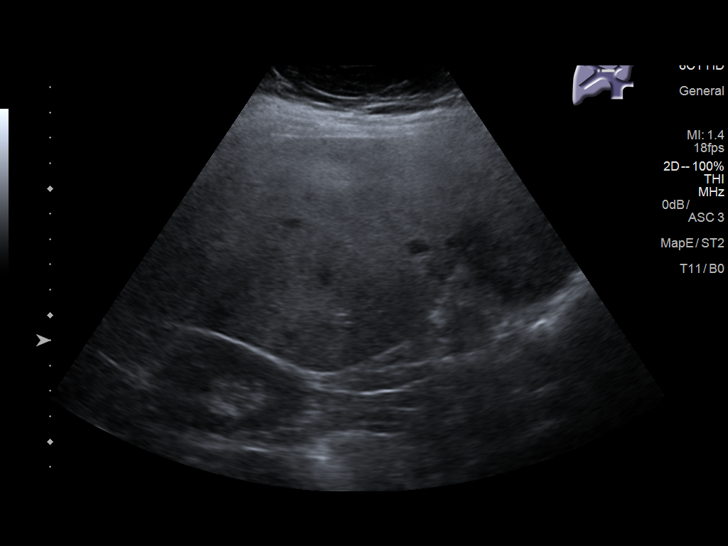
[im 47/47]
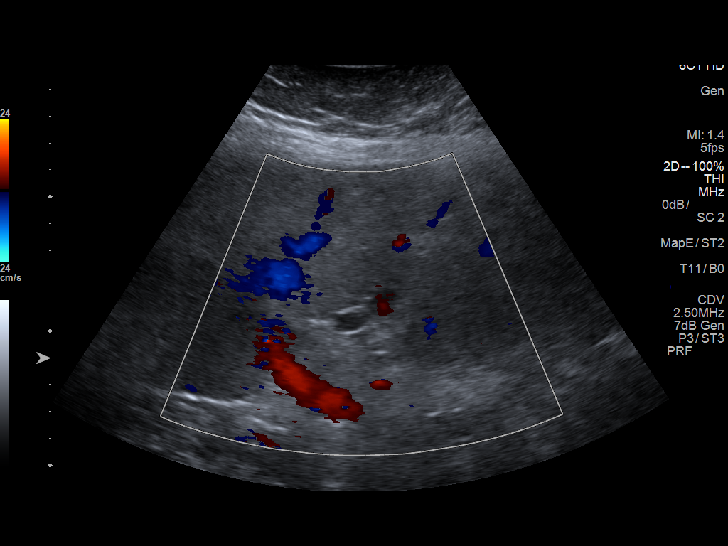

[14 of 25 positions shown; findings below may reference images not displayed]

FINDINGS: Gallbladder:

Multiple shadowing stones measuring up to 8.5 mm. Normal wall
thickness. Negative sonographic Ceejay.

Common bile duct:

Diameter: Normal at 4.9 mm

Liver:

Coarse echogenic liver consistent with fatty infiltration. No
ascites.
IMPRESSION: 1. Cholelithiasis without other sonographic features to suggest
acute cholecystitis. No biliary dilatation.
2. Fatty infiltration of the liver

## 2018-05-26 NOTE — Telephone Encounter (Signed)
LVM for Denise Freeman with [phone] at [facility] to return call. X2 Office is closed today thru 05/28/2018 for the Holidays.

## 2018-05-28 NOTE — Telephone Encounter (Signed)
Spoke with [name] at [facility], she states they never received an order. It looks like we were waiting on a signature but [name] was notified. [name] states she does not see an order in the system. Pcc's can we resend order to [facility]?

## 2018-05-28 NOTE — Telephone Encounter (Signed)
Attempted to call patient today regarding below message from [facility], [place]. I did not receive an answer at time of call. I have left a voicemail message for pt to return call. X1

## 2018-05-28 NOTE — Telephone Encounter (Addendum)
After reading through all of the messages (dated 05/06/18 & 1125/19) on this patient for a new nebulizer, pt needs a face to face to be completed.  Also, this order will need to be sent to [facility] not [facility] per pt's request..Marland Kitchen

## 2018-05-29 ENCOUNTER — Ambulatory Visit: Payer: Self-pay

## 2018-06-04 ENCOUNTER — Telehealth: Payer: Self-pay | Admitting: Internal Medicine

## 2018-06-04 MED ORDER — METOLAZONE 2.5 MG PO TABS: ORAL_TABLET | ORAL | 2 refills | Status: DC

## 2018-06-04 NOTE — Telephone Encounter (Signed)
New message    *STAT* If patient is at the pharmacy, call can be transferred to refill team.   1. Which medications need to be refilled? (please list name of each medication and dose if known) metolazone (ZAROXOLYN) 2.5 MG tablet  2. Which pharmacy/location (including street and city if local pharmacy) is medication to be sent to?[place], [place]  3. Do they need a 30 day or 90 day supply? [place]

## 2018-06-04 NOTE — Telephone Encounter (Signed)
Rx has been refilled.  

## 2018-06-08 ENCOUNTER — Telehealth: Payer: Self-pay | Admitting: Internal Medicine

## 2018-06-08 ENCOUNTER — Telehealth: Payer: Self-pay | Admitting: Pulmonary Disease

## 2018-06-08 ENCOUNTER — Other Ambulatory Visit: Payer: Medicare Other | Admitting: Women's Health

## 2018-06-08 NOTE — Telephone Encounter (Signed)
Spoke with Denise Freeman, advised her that she needed an OV. Appt made with Derl Barrow on 06/12/2017 at 11:30. Nothing further is needed.

## 2018-06-08 NOTE — Telephone Encounter (Signed)
New Message    *STAT* If patient is at the pharmacy, call can be transferred to refill team.   1. Which medications need to be refilled? (please list name of each medication and dose if known) metoprolol succinate (TOPROL-XL) 25 MG 24 hr tablet  2. Which pharmacy/location (including street and city if local pharmacy) is medication to be sent to? [facility], [place]  3. Do they need a 30 day or 90 day supply?

## 2018-06-09 ENCOUNTER — Other Ambulatory Visit: Payer: Medicare Other | Admitting: Women's Health

## 2018-06-10 ENCOUNTER — Other Ambulatory Visit: Payer: Self-pay | Admitting: Internal Medicine

## 2018-06-10 MED ORDER — METOPROLOL SUCCINATE ER 25 MG PO TB24: 25.0000 mg | ORAL_TABLET | Freq: Two times a day (BID) | ORAL | 3 refills | Status: DC

## 2018-06-10 NOTE — Telephone Encounter (Signed)
Refill sent to the pharmacy electronically.  

## 2018-06-11 ENCOUNTER — Encounter (HOSPITAL_COMMUNITY): Payer: Self-pay | Admitting: *Deleted

## 2018-06-11 ENCOUNTER — Other Ambulatory Visit: Payer: Self-pay

## 2018-06-11 ENCOUNTER — Emergency Department (HOSPITAL_COMMUNITY)
Admission: EM | Admit: 2018-06-11 | Discharge: 2018-06-11 | Disposition: A | Discharge status: Home / Self Care | Payer: Medicare Other | Attending: Emergency Medicine | Admitting: Emergency Medicine

## 2018-06-11 ENCOUNTER — Emergency Department (HOSPITAL_COMMUNITY): Payer: Medicare Other

## 2018-06-11 ENCOUNTER — Other Ambulatory Visit: Payer: Self-pay | Admitting: Internal Medicine

## 2018-06-11 DIAGNOSIS — Z7901 Long term (current) use of anticoagulants: Secondary | ICD-10-CM | POA: Insufficient documentation

## 2018-06-11 DIAGNOSIS — Z794 Long term (current) use of insulin: Secondary | ICD-10-CM | POA: Diagnosis not present

## 2018-06-11 DIAGNOSIS — I130 Hypertensive heart and chronic kidney disease with heart failure and stage 1 through stage 4 chronic kidney disease, or unspecified chronic kidney disease: Secondary | ICD-10-CM | POA: Diagnosis not present

## 2018-06-11 DIAGNOSIS — I5042 Chronic combined systolic (congestive) and diastolic (congestive) heart failure: Secondary | ICD-10-CM | POA: Insufficient documentation

## 2018-06-11 DIAGNOSIS — Y999 Unspecified external cause status: Secondary | ICD-10-CM | POA: Diagnosis not present

## 2018-06-11 DIAGNOSIS — S5011XA Contusion of right forearm, initial encounter: Secondary | ICD-10-CM | POA: Insufficient documentation

## 2018-06-11 DIAGNOSIS — E119 Type 2 diabetes mellitus without complications: Secondary | ICD-10-CM | POA: Insufficient documentation

## 2018-06-11 DIAGNOSIS — S50811A Abrasion of right forearm, initial encounter: Secondary | ICD-10-CM | POA: Diagnosis not present

## 2018-06-11 DIAGNOSIS — Z79899 Other long term (current) drug therapy: Secondary | ICD-10-CM | POA: Diagnosis not present

## 2018-06-11 DIAGNOSIS — Z87891 Personal history of nicotine dependence: Secondary | ICD-10-CM | POA: Insufficient documentation

## 2018-06-11 DIAGNOSIS — S40021A Contusion of right upper arm, initial encounter: Secondary | ICD-10-CM

## 2018-06-11 DIAGNOSIS — Y929 Unspecified place or not applicable: Secondary | ICD-10-CM | POA: Insufficient documentation

## 2018-06-11 DIAGNOSIS — J45909 Unspecified asthma, uncomplicated: Secondary | ICD-10-CM | POA: Insufficient documentation

## 2018-06-11 DIAGNOSIS — X58XXXA Exposure to other specified factors, initial encounter: Secondary | ICD-10-CM | POA: Insufficient documentation

## 2018-06-11 DIAGNOSIS — S59911A Unspecified injury of right forearm, initial encounter: Secondary | ICD-10-CM | POA: Diagnosis not present

## 2018-06-11 DIAGNOSIS — Y939 Activity, unspecified: Secondary | ICD-10-CM | POA: Diagnosis not present

## 2018-06-11 DIAGNOSIS — N183 Chronic kidney disease, stage 3 (moderate): Secondary | ICD-10-CM | POA: Insufficient documentation

## 2018-06-11 NOTE — Discharge Instructions (Addendum)
Clean area gently with soap and water twice a day and apply Band-Aid.  Follow-up with your doctor next week if any problems

## 2018-06-11 NOTE — ED Provider Notes (Signed)
[facility] EMERGENCY DEPARTMENT Provider Note   CSN: [id number] Arrival date & time: 06/11/18  6834     History   Chief Complaint Chief Complaint  Patient presents with  . Arm Pain    HPI Denise Freeman is a 68 y.o. female.  Patient actually had her right arm recently and complains of pain.   The history is provided by the patient.  Arm Pain  This is a new problem. The current episode started 2 days ago. The problem occurs rarely. The problem has not changed since onset.Pertinent negatives include no chest pain, no abdominal pain and no headaches. Nothing aggravates the symptoms. Nothing relieves the symptoms.    Past Medical History:  Diagnosis Date  . Allergy   . Anemia   . Anxiety   . Asthma   . Atrial fibrillation ([place])   . Bipolar 1 disorder ([place])   . 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 Cholelithiasis    a. 09/2016 s/p Lap Chole.  . Chronic bronchitis ([place])   . Chronic combined systolic and diastolic congestive heart failure ([place])    a. 09/2016 Echo: EF 30-35%.  . Colon polyps   . COPD (chronic obstructive pulmonary disease) ([place])   . Depression   . Family history of adverse reaction to anesthesia    Uncle was positive for malignant hyperthermia; patient had testing done and was negative.  . Fibromyalgia   . GERD (gastroesophageal reflux disease)   . Gout   . High cholesterol   . History of blood transfusion 06/2015   "bleeding from my rectum"  . History of hiatal hernia   . HOH (hard of hearing)   . Hx of colonic polyps 03/21/2016   3 small adenomas no recall - co-morbidities  . Neuropathy    Disc Back   . NICM (nonischemic cardiomyopathy) ([place])    a. Previously worked up in [place], MD-->low EF with subsequent recovery.  Multiple caths (last ~ 2014 per pt report)--reportedly nl cors;  b. 09/2016 Echo: EF 30-35%, antsept/apical HK, mild MR, mildly dil LA, mod dil RA;  c. 09/2016 Lexi MV: EF 26%, glob HK, sept DK, med size, mod intensity  fixed septal defect - BBB/PVC related artifact, no ischemia.  . On home oxygen therapy    "3L; 24/7" (03/14/2016)  . OSA treated with BiPAP    uses biPAP, 10 (03/14/2016)  . Osteoarthritis   . Oxygen deficiency   . Pneumonia   . Type II diabetes mellitus [facility])     Patient Active Problem List   Diagnosis Date Noted  . Gout 11/16/2017  . Chronic kidney disease 11/16/2017  . Uncontrolled type 2 diabetes mellitus with hyperglycemia ([place]) 11/12/2017  . Pain of left lower extremity 10/24/2017  . Medically complex patient 10/03/2017  . Chronic pain syndrome 10/03/2017  . Unstable gait 10/03/2017  . Diarrhea 07/29/2017  . Type 2 diabetes mellitus with stage 3 chronic kidney disease, with long-term current use of insulin ([place]) 04/01/2017  . Essential hypertension, benign 04/01/2017  . Mood disorder in conditions classified elsewhere 03/31/2017  . Chronic diastolic congestive heart failure ([place]) 03/12/2017  . Chronic systolic CHF (congestive heart failure) ([place]) 11/05/2016  . Pressure injury of skin 09/09/2016  . NICM (nonischemic cardiomyopathy) ([place])   . Class 2 severe obesity due to excess calories with serious comorbidity and body mass index (BMI) of 38.0 to 38.9 in adult [facility])   . Acute systolic congestive heart failure ([place])   .  S/P laparoscopic cholecystectomy   . Heart failure ([place]) 09/06/2016  . Dyslipidemia 08/07/2016  . Vision loss 04/19/2016  . Hx of colonic polyps 03/21/2016  . Blood in stool   . Benign neoplasm of cecum   . Benign neoplasm of rectum   . CKD (chronic kidney disease), stage III ([place]) 03/14/2016  . Mixed hyperlipidemia 03/14/2016  . Scalp lesion 03/14/2016  . Peripheral polyneuropathy 01/26/2016  . Vitamin D deficiency 09/26/2015  . DM type 2 causing vascular disease ([place]) 08/28/2015  . Chronic upper GI bleeding 06/24/2015  . COPD with asthma ([place]) 02/22/2015  . OSA (obstructive sleep apnea) 08/22/2014  . Other emphysema ([place]) 05/29/2014  . Chronic  hypoxic, hypercapnic respiratory failure 04/21/2014  . Microcytic anemia 04/21/2014  . Fibromyalgia 04/14/2014  . Uncontrolled secondary diabetes mellitus with stage 3 CKD (GFR 30-59) ([facility]) 04/14/2014  . Dyspnea on exertion 04/14/2014  . Acute on chronic combined systolic and diastolic CHF (congestive heart failure) ([place]) 04/14/2014  . Atrial fibrillation [I48.91] 04/12/2014    Past Surgical History:  Procedure Laterality Date  . APPENDECTOMY     "they busted"  . bladder stimulator     pt states, "it cannot be turned off; it's in my right hip; dead battery so it's not working anymore". (03/14/2016)  . BLADDER SUSPENSION     2003, 2006 and 2010  . CATARACT EXTRACTION W/PHACO Right 11/29/2014   Procedure: CATARACT EXTRACTION PHACO AND INTRAOCULAR LENS PLACEMENT (IOC);  Surgeon: Rutherford Guys, MD;  Location: [facility];  Service: Ophthalmology;  Laterality: Right;  CDE:3.81  . CATARACT EXTRACTION W/PHACO Left 12/13/2014   Procedure: CATARACT EXTRACTION PHACO AND INTRAOCULAR LENS PLACEMENT (IOC);  Surgeon: Rutherford Guys, MD;  Location: [facility];  Service: Ophthalmology;  Laterality: Left;  CDE:6.59  . CERVICAL DISC SURGERY N/A 2009   4, 6, and 7 cervical disc replaced  . CHOLECYSTECTOMY N/A 09/13/2016   Procedure: LAPAROSCOPIC CHOLECYSTECTOMY;  Surgeon: Rolm Bookbinder, MD;  Location: [place];  Service: General;  Laterality: N/A;  . COLONOSCOPY WITH PROPOFOL N/A 03/15/2016   Procedure: COLONOSCOPY WITH PROPOFOL;  Surgeon: Gatha Mayer, MD;  Location: [place];  Service: Endoscopy;  Laterality: N/A;  . ESOPHAGOGASTRODUODENOSCOPY (EGD) WITH PROPOFOL N/A 03/15/2016   Procedure: ESOPHAGOGASTRODUODENOSCOPY (EGD) WITH PROPOFOL;  Surgeon: Gatha Mayer, MD;  Location: [place];  Service: Endoscopy;  Laterality: N/A;  . HEEL SPUR SURGERY Bilateral   . HERNIA REPAIR    . I&D EXTREMITY Right 06/13/2015   Procedure: MINOR IRRIGATION AND DEBRIDEMENT EXTREMITY REMOVAL OF NAIL;  Surgeon: Daryll Brod, MD;   Location: [place];  Service: Orthopedics;  Laterality: Right;  . TUBAL LIGATION    . UMBILICAL HERNIA REPAIR     w/mesh     OB History   No obstetric history on file.      Home Medications    Prior to Admission medications   Medication Sig Start Date End Date Taking? Authorizing Provider  albuterol (PROVENTIL) (2.5 MG/3ML) 0.083% nebulizer solution USE (1) IN NEBULIZER EVERY 6 HOURS AS NEEDED FOR SHORTNESS OF BREATH. 02/09/18   Chesley Mires, MD  allopurinol (ZYLOPRIM) 100 MG tablet TAKE 1 TABLET BY MOUTH ONCE DAILY. 12/08/17   Steve Rattler, DO  cetirizine (ZYRTEC) 10 MG tablet Take 1 tablet (10 mg total) by mouth at bedtime. 01/30/17   Rogue Bussing, MD  cholecalciferol (VITAMIN D) 400 units TABS tablet Take 1 tablet (400 Units total) by mouth 2 (two) times daily. 07/17/17   [name]  Moen, MD  colchicine 0.6 MG tablet Take 1 tablet (0.6 mg total) by mouth daily. 11/14/17   Rogue Bussing, MD  colchicine 0.6 MG tablet Take 1 tablet (0.6 mg total) by mouth daily. If have stomach upset, can reduce to every other day. 11/28/17   Rogue Bussing, MD  docusate sodium (COLACE) 100 MG capsule Take 100 mg by mouth daily.    [provider]  escitalopram (LEXAPRO) 10 MG tablet Take 1 tablet (10 mg total) by mouth at bedtime. 10/24/17   Norman Clay, MD  fenofibrate 54 MG tablet TAKE 1 TABLET BY MOUTH ONCE DAILY. 10/09/17   Cassandria Anger, MD  ferrous sulfate 324 (65 Fe) MG TBEC Take 1 tablet by mouth daily.    [provider]  furosemide (LASIX) 40 MG tablet TAKE (2) TABLETS BY MOUTH THREE TIMES DAILY. 06/10/18   Hilty, Nadean Corwin, MD  Insulin Glargine (BASAGLAR KWIKPEN) 100 UNIT/ML SOPN Inject 0.6 mLs (60 Units total) into the skin at bedtime. 11/12/17   Cassandria Anger, MD  insulin lispro (HUMALOG KWIKPEN) 100 UNIT/ML KiwkPen Inject 0.1-0.16 mLs (10-16 Units total) into the skin 3 (three) times daily before meals.  11/12/17   Cassandria Anger, MD  Insulin Pen Needle 32G X 4 MM MISC Use to inject insulin 5 times daily 03/27/18   Philemon Kingdom, MD  ipratropium (ATROVENT) 0.02 % nebulizer solution USE (1) IN NEBULIZER EVERY 6 HOURS AS NEEDED FOR SHORTNESS OF BREATH. 07/17/17   Chesley Mires, MD  losartan (COZAAR) 25 MG tablet Take 0.5 tablets (12.5 mg total) by mouth daily. 10/06/17 01/04/18  Hilty, Nadean Corwin, MD  metolazone (ZAROXOLYN) 2.5 MG tablet TAKE 1 TABLET BY MOUTH ONCE DAILY AS NEEDED FOR EDEMA. * TAKE 30 MINUTES BEFORE LASIX* 06/04/18   Hilty, Nadean Corwin, MD  metoprolol succinate (TOPROL-XL) 25 MG 24 hr tablet Take 1 tablet (25 mg total) by mouth 2 (two) times daily. 06/10/18   Hilty, Nadean Corwin, MD  mirtazapine (REMERON) 15 MG tablet Take 1 tablet (15 mg total) by mouth at bedtime. 10/24/17   Norman Clay, MD  modafinil (PROVIGIL) 200 MG tablet Take 1 tablet (200 mg total) by mouth daily. 03/24/18   Chesley Mires, MD  mometasone (NASONEX) 50 MCG/ACT nasal spray Place 1 spray into the nose daily. 01/10/17   Chesley Mires, MD  montelukast (SINGULAIR) 10 MG tablet TAKE ONE TABLET BY MOUTH AT BEDTIME. 03/19/18   Chesley Mires, MD  mupirocin cream (BACTROBAN) 2 % Apply 1 application topically 2 (two) times daily. 11/27/17   Rogue Bussing, MD  nitroGLYCERIN (NITROSTAT) 0.4 MG SL tablet Place 1 tablet (0.4 mg total) under the tongue every 5 (five) minutes as needed for chest pain. 10/31/15   Hilty, Nadean Corwin, MD  nystatin (NYSTATIN) powder Apply topically 2 (two) times daily. Apply under breasts 10/09/16   Rogue Bussing, MD  oxyCODONE-acetaminophen (PERCOCET/ROXICET) 5-325 MG tablet Take 1 tablet by mouth 2 (two) times daily as needed for severe pain. 11/27/17   Rogue Bussing, MD  OXYGEN Inhale 3 L into the lungs continuous. 3 liters 24/7    [provider]  pantoprazole (PROTONIX) 20 MG tablet TAKE (1) TABLET BY MOUTH TWICE A DAY BEFORE MEALS. (BREAKFAST AND SUPPER) 12/02/17    Riccio, Gardiner Rhyme, DO  predniSONE (DELTASONE) 5 MG tablet Take 1 tablet (5 mg total) by mouth daily with breakfast. Patient not taking: Reported on 04/29/2018 11/07/17   Chesley Mires, MD  PRESCRIPTION MEDICATION Inhale  into the lungs See admin instructions. Use BIPAP every time laying down    [provider]  primidone (MYSOLINE) 50 MG tablet TAKE ONE TABLET BY MOUTH AT BEDTIME. 11/10/17   Rogue Bussing, MD  Probiotic Product (PROBIOTIC PO) Take 1 tablet by mouth at bedtime.    [provider]  triamcinolone lotion (KENALOG) 0.1 % Apply 1 application topically 3 (three) times daily. For up to 14 days and as needed 01/26/16   [place]ique, Betty G, MD  TRUE [facility] BLOOD GLUCOSE TEST test strip USE TO TEST BLOOD SUGAR AS DIRECTED. 09/24/17   Cassandria Anger, MD  vitamin A 10000 UNIT capsule Take 10,000 Units by mouth every morning.    [provider]  warfarin (COUMADIN) 4 MG tablet Take 1 tablet daily except 1/2 tablet on Thursdays 02/18/18   Hilty, Nadean Corwin, MD    Family History Family History  Problem Relation Age of Onset  . Heart disease Mother   . COPD Mother   . Diabetes Mother   . Breast cancer Mother   . Heart disease Father   . Hyperlipidemia Father   . COPD Sister   . Heart disease Sister   . Diabetes Sister   . Heart disease Maternal Grandmother   . Cancer Maternal Grandmother        stomach  . Cancer Maternal Grandfather        lung   . Bipolar disorder Brother     Social History Social History   Tobacco Use  . Smoking status: Former Smoker    Packs/day: 2.00    Years: 14.00    Pack years: 28.00    Types: Cigarettes    Last attempt to quit: 04/12/1989    Years since quitting: 29.1  . Smokeless tobacco: Never Used  Substance Use Topics  . Alcohol use: No    Alcohol/week: 0.0 standard drinks  . Drug use: No     Allergies   Xarelto [rivaroxaban]; Ancef [cefazolin]; Levaquin [levofloxacin in d5w]; Lyrica [pregabalin]; Tamiflu  [oseltamivir phosphate]; Zoloft [sertraline hcl]; Augmentin [amoxicillin-pot clavulanate]; Ciprofloxacin; Haldol [haloperidol]; Nsaids; Penicillins; and Topamax [topiramate]   Review of Systems Review of Systems  Constitutional: Negative for appetite change and fatigue.  HENT: Negative for congestion, ear discharge and sinus pressure.   Eyes: Negative for discharge.  Respiratory: Negative for cough.   Cardiovascular: Negative for chest pain.  Gastrointestinal: Negative for abdominal pain and diarrhea.  Genitourinary: Negative for frequency and hematuria.  Musculoskeletal: Negative for back pain.       Right arm pain  Skin: Negative for rash.  Neurological: Negative for seizures and headaches.  Psychiatric/Behavioral: Negative for hallucinations.     Physical Exam Updated Vital Signs BP (!) 161/68 (BP Location: Right Arm)   Pulse 62   Temp 98.1 F (36.7 C) (Oral)   Resp (!) 22   Ht 4\' 11"  (1.499 m)   Wt 78.9 kg   SpO2 100%   BMI 35.14 kg/m   Physical Exam Vitals signs reviewed.  Constitutional:      Appearance: She is well-developed.  HENT:     Head: Normocephalic.  Eyes:     Conjunctiva/sclera: Conjunctivae normal.  Neck:     Trachea: No tracheal deviation.  Cardiovascular:     Rate and Rhythm: Normal rate.     Heart sounds: No murmur.  Pulmonary:     Effort: Pulmonary effort is normal.  Musculoskeletal: Normal range of motion.     Comments: Small abrasions and tenderness  to right forearm  Skin:    General: Skin is warm.  Neurological:     Mental Status: She is alert and oriented to person, place, and time.      ED Treatments / Results  Labs (all labs ordered are listed, but only abnormal results are displayed) Labs Reviewed - No data to display  EKG None  Radiology Dg Forearm Right  Result Date: 06/11/2018 CLINICAL DATA:  Mid RIGHT forearm pain EXAM: RIGHT FOREARM - 2 VIEW COMPARISON:  None FINDINGS: Osseous mineralization normal. Joint spaces  preserved. No fracture, dislocation, or bone destruction. IMPRESSION: Normal exam. Electronically Signed   By: Lavonia Dana M.D.   On: 06/11/2018 08:37    Procedures Procedures (including critical care time)  Medications Ordered in ED Medications - No data to display   Initial Impression / Assessment and Plan / ED Course  I have reviewed the triage vital signs and the nursing notes.  Pertinent labs & imaging results that were available during my care of the patient were reviewed by me and considered in my medical decision making (see chart for details).     Contusions and abrasions to right forearm.  X-ray negative.  Patient is told to clean abrasions twice a day with soap and water and follow-up with her PCP if needed  Final Clinical Impressions(s) / ED Diagnoses   Final diagnoses:  Contusion of multiple sites of right upper extremity, initial encounter    ED Discharge Orders    None       Milton Ferguson, MD 06/11/18 [phone]
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
## 2018-06-12 ENCOUNTER — Ambulatory Visit (INDEPENDENT_AMBULATORY_CARE_PROVIDER_SITE_OTHER): Payer: Medicare Other | Admitting: Primary Care

## 2018-06-12 ENCOUNTER — Encounter: Payer: Self-pay | Admitting: Primary Care

## 2018-06-12 ENCOUNTER — Other Ambulatory Visit: Payer: Self-pay

## 2018-06-12 VITALS — BP 120/76 | HR 66 | Temp 98.3°F | Ht 59.0 in | Wt 177.6 lb

## 2018-06-12 DIAGNOSIS — R0602 Shortness of breath: Secondary | ICD-10-CM | POA: Diagnosis not present

## 2018-06-12 DIAGNOSIS — G4733 Obstructive sleep apnea (adult) (pediatric): Secondary | ICD-10-CM

## 2018-06-12 DIAGNOSIS — J449 Chronic obstructive pulmonary disease, unspecified: Secondary | ICD-10-CM

## 2018-06-12 MED ORDER — ALBUTEROL SULFATE (2.5 MG/3ML) 0.083% IN NEBU
2.5000 mg | INHALATION_SOLUTION | RESPIRATORY_TRACT | Status: AC
  Administered 2018-06-12: 2.5 mg via RESPIRATORY_TRACT

## 2018-06-12 NOTE — Patient Instructions (Addendum)
Ambulatory referral for new nebulizer machine - patient would prefer not to use [employer]   Continue to 3L oxygen 24/7 and BiPAP every night   Follow up with Dr. Halford Chessman on 1/21 at 10:30am

## 2018-06-12 NOTE — Assessment & Plan Note (Addendum)
Ambulatory referral for new nebulizer machine  Ipratropium and albuterol q 6hrs prn and Singulair  Continue 3L oxygen 24/7

## 2018-06-12 NOTE — Patient Outreach (Signed)
[facility] Care Management  06/12/2018  Denise Freeman [facility] [birth year] [phone]    1st outreach attempt to the patient for assessment.  No answer.  HIPAA compliant voicemail left with contact information.   Plan: RN Health Coach will send letter. [place] will make outreach attempt the patient within thirty days.  [name] RN, BSN, [place] Direct Dial:  [phone]  Fax: [phone]

## 2018-06-12 NOTE — Progress Notes (Signed)
Reviewed and agree with assessment/plan.   Lecretia Buczek, MD [facility] 05/29/2016, 12:24 PM Pager:  [phone]  

## 2018-06-12 NOTE — Assessment & Plan Note (Addendum)
Continue BiPAP Has follow up with Dr. Halford Chessman end of January

## 2018-06-12 NOTE — Progress Notes (Signed)
@Patient  ID: Denise Freeman, female    DOB: [birth year], 68 y.o.   MRN: [id number]  Chief Complaint  Patient presents with  . Follow-up    needs nebulizer    Referring provider: Celene Squibb, MD  HPI: 68 year old female, former smoker quit 1990. PMH COPD with asthma, OSA, emphysema, CHF, HTN, afib, Chronic upper GI bleed, type 2 diabetes with stage 3 CKD. Patient of Dr. Halford Chessman, last seen on 11/07/17.   06/12/2018 Patient presents today for regular follow, needs OV for nebulizer. Doing ok, she has been having breathing issues d/t not having machine to take nebulized medications. States that her machine quit working 3 months. Seen by Dr. Halford Chessman in June, instructed to continue ipratropium and albuterol q 6hrs prn and Singulair. Continues to wear 3L oxygen 24/L. She is on chronic prednisone for gout.   Allergies  Allergen Reactions  . Xarelto [Rivaroxaban] Other (See Comments)    Internal bleeding  . Ancef [Cefazolin] Nausea And Vomiting  . Levaquin [Levofloxacin In D5w] Other (See Comments)    "afib"  . Lyrica [Pregabalin] Hives  . Tamiflu [Oseltamivir Phosphate] Other (See Comments)    "water blisters"  . Zoloft [Sertraline Hcl] Other (See Comments)    Jaw problems, jittery  . Augmentin [Amoxicillin-Pot Clavulanate] Itching  . Ciprofloxacin Itching and Nausea And Vomiting  . Haldol [Haloperidol] Other (See Comments)    Restless leg  . Nsaids Diarrhea  . Penicillins Itching, Nausea And Vomiting and Rash    Has patient had a PCN reaction causing immediate rash, facial/tongue/throat swelling, SOB or lightheadedness with hypotension: Yes Has patient had a PCN reaction causing severe rash involving mucus membranes or skin necrosis: No Has patient had a PCN reaction that required hospitalization No Has patient had a PCN reaction occurring within the last 10 years: Yes If all of the above answers are "NO", then may proceed with Cephalosporin use.  . Topamax [Topiramate] Nausea Only     Immunization History  Administered Date(s) Administered  . Influenza Split 03/03/2014  . Influenza,inj,Quad PF,6+ Mos 01/30/2016, 01/27/2017  . Influenza-Unspecified 02/06/2015  . Pneumococcal Conjugate-13 01/30/2016  . Pneumococcal Polysaccharide-23 11/24/2016  . Tetanus 06/03/2013    Past Medical History:  Diagnosis Date  . Allergy   . Anemia   . Anxiety   . Asthma   . Atrial fibrillation ([place])   . Bipolar 1 disorder ([place])   . 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 Cholelithiasis    a. 09/2016 s/p Lap Chole.  . Chronic bronchitis ([place])   . Chronic combined systolic and diastolic congestive heart failure ([place])    a. 09/2016 Echo: EF 30-35%.  . Colon polyps   . COPD (chronic obstructive pulmonary disease) ([place])   . Depression   . Family history of adverse reaction to anesthesia    Uncle was positive for malignant hyperthermia; patient had testing done and was negative.  . Fibromyalgia   . GERD (gastroesophageal reflux disease)   . Gout   . High cholesterol   . History of blood transfusion 06/2015   "bleeding from my rectum"  . History of hiatal hernia   . HOH (hard of hearing)   . Hx of colonic polyps 03/21/2016   3 small adenomas no recall - co-morbidities  . Neuropathy    Disc Back   . NICM (nonischemic cardiomyopathy) ([place])    a. Previously worked up in [place], [place]-->low EF with subsequent recovery.  Multiple caths (last ~  2014 per pt report)--reportedly nl cors;  b. 09/2016 Echo: EF 30-35%, antsept/apical HK, mild MR, mildly dil LA, mod dil RA;  c. 09/2016 [facility] MV: EF 26%, glob HK, sept DK, med size, mod intensity fixed septal defect - BBB/PVC related artifact, no ischemia.  . On home oxygen therapy    "3L; 24/7" (03/14/2016)  . OSA treated with BiPAP    uses biPAP, 10 (03/14/2016)  . Osteoarthritis   . Oxygen deficiency   . Pneumonia   . Type II diabetes mellitus (H[facility])     Tobacco History: Social History   Tobacco Use  Smoking Status  Former Smoker  . Packs/day: 2.00  . Years: 14.00  . Pack years: 28.00  . Types: Cigarettes  . Last attempt to quit: 04/12/1989  . Years since quitting: 29.1  Smokeless Tobacco Never Used   Counseling given: Not Answered   Outpatient Medications Prior to Visit  Medication Sig Dispense Refill  . albuterol (PROVENTIL) (2.5 MG/3ML) 0.083% nebulizer solution USE (1) IN NEBULIZER EVERY 6 HOURS AS NEEDED FOR SHORTNESS OF BREATH. 75 mL 2  . allopurinol (ZYLOPRIM) 100 MG tablet TAKE 1 TABLET BY MOUTH ONCE DAILY. 30 tablet 0  . cetirizine (ZYRTEC) 10 MG tablet Take 1 tablet (10 mg total) by mouth at bedtime. 30 tablet 11  . cholecalciferol (VITAMIN D) 400 units TABS tablet Take 1 tablet (400 Units total) by mouth 2 (two) times daily. 180 each 3  . colchicine 0.6 MG tablet Take 1 tablet (0.6 mg total) by mouth daily. 30 tablet 1  . colchicine 0.6 MG tablet Take 1 tablet (0.6 mg total) by mouth daily. If have stomach upset, can reduce to every other day. 30 tablet 0  . docusate sodium (COLACE) 100 MG capsule Take 100 mg by mouth daily.    [place] Kitchen escitalopram (LEXAPRO) 10 MG tablet Take 1 tablet (10 mg total) by mouth at bedtime. 90 tablet 0  . fenofibrate 54 MG tablet TAKE 1 TABLET BY MOUTH ONCE DAILY. 28 tablet 3  . ferrous sulfate 324 (65 Fe) MG TBEC Take 1 tablet by mouth daily.    . furosemide (LASIX) 40 MG tablet TAKE (2) TABLETS BY MOUTH THREE TIMES DAILY. 180 tablet 5  . Insulin Glargine (BASAGLAR KWIKPEN) 100 UNIT/ML SOPN Inject 0.6 mLs (60 Units total) into the skin at bedtime. 15 mL 2  . insulin lispro (HUMALOG KWIKPEN) 100 UNIT/ML KiwkPen Inject 0.1-0.16 mLs (10-16 Units total) into the skin 3 (three) times daily before meals. 15 mL 2  . Insulin Pen Needle 32G X 4 MM MISC Use to inject insulin 5 times daily 500 each 5  . ipratropium (ATROVENT) 0.02 % nebulizer solution USE (1) IN NEBULIZER EVERY 6 HOURS AS NEEDED FOR SHORTNESS OF BREATH. 75 mL 2  . metolazone (ZAROXOLYN) 2.5 MG tablet TAKE 1  TABLET BY MOUTH ONCE DAILY AS NEEDED FOR EDEMA. * TAKE 30 MINUTES BEFORE LASIX* 90 tablet 2  . metoprolol succinate (TOPROL-XL) 25 MG 24 hr tablet Take 1 tablet (25 mg total) by mouth 2 (two) times daily. 180 tablet 3  . mirtazapine (REMERON) 15 MG tablet Take 1 tablet (15 mg total) by mouth at bedtime. 90 tablet 0  . modafinil (PROVIGIL) 200 MG tablet Take 1 tablet (200 mg total) by mouth daily. 30 tablet 5  . mometasone (NASONEX) 50 MCG/ACT nasal spray Place 1 spray into the nose daily. 17 g 11  . montelukast (SINGULAIR) 10 MG tablet TAKE ONE TABLET BY MOUTH AT BEDTIME. [place]  tablet 6  . mupirocin cream (BACTROBAN) 2 % Apply 1 application topically 2 (two) times daily. 15 g 0  . nitroGLYCERIN (NITROSTAT) 0.4 MG SL tablet Place 1 tablet (0.4 mg total) under the tongue every 5 (five) minutes as needed for chest pain. 25 tablet 3  . nystatin (NYSTATIN) powder Apply topically 2 (two) times daily. Apply under breasts 15 g 3  . oxyCODONE-acetaminophen (PERCOCET/ROXICET) 5-325 MG tablet Take 1 tablet by mouth 2 (two) times daily as needed for severe pain. 60 tablet 0  . OXYGEN Inhale 3 L into the lungs continuous. 3 liters 24/7    . pantoprazole (PROTONIX) 20 MG tablet TAKE (1) TABLET BY MOUTH TWICE A DAY BEFORE MEALS. (BREAKFAST AND SUPPER) 56 tablet 0  . potassium chloride (K-DUR) 10 MEQ tablet TAKE 1 TABLET BY MOUTH ONCE DAILY. 28 tablet 10  . predniSONE (DELTASONE) 5 MG tablet Take 1 tablet (5 mg total) by mouth daily with breakfast. 30 tablet 2  . PRESCRIPTION MEDICATION Inhale into the lungs See admin instructions. Use BIPAP every time laying down    . primidone (MYSOLINE) 50 MG tablet TAKE ONE TABLET BY MOUTH AT BEDTIME. 30 tablet 5  . Probiotic Product (PROBIOTIC PO) Take 1 tablet by mouth at bedtime.    . triamcinolone lotion (KENALOG) 0.1 % Apply 1 application topically 3 (three) times daily. For up to 14 days and as needed 60 mL 1  . TRUE METRIX BLOOD GLUCOSE TEST test strip USE TO TEST BLOOD  SUGAR AS DIRECTED. 50 each 2  . vitamin A 10000 UNIT capsule Take 10,000 Units by mouth every morning.    . warfarin (COUMADIN) 4 MG tablet Take 1 tablet daily except 1/2 tablet on Thursdays 30 tablet 4  . losartan (COZAAR) 25 MG tablet Take 0.5 tablets (12.5 mg total) by mouth daily. 45 tablet 2   No facility-administered medications prior to visit.     Review of Systems  Review of Systems  Constitutional: Negative.   HENT: Negative.   Respiratory: Positive for shortness of breath and wheezing. Negative for cough and chest tightness.   Cardiovascular: Negative.     Physical Exam  BP 120/76 (BP Location: Left Arm, Cuff Size: Normal)   Pulse 66   Temp 98.3 F (36.8 C)   Ht 4\' 11"  (1.499 m)   Wt 177 lb 9.6 oz (80.6 kg)   SpO2 97%   BMI 35.87 kg/m  Physical Exam Constitutional:      General: She is not in acute distress.    Appearance: She is well-developed. She is obese. She is not ill-appearing.  HENT:     Head: Normocephalic and atraumatic.     Right Ear: Tympanic membrane normal.     Left Ear: Tympanic membrane normal.     Nose: Nose normal.     Mouth/Throat:     Mouth: Mucous membranes are moist.     Pharynx: Oropharynx is clear.  Eyes:     Pupils: Pupils are equal, round, and reactive to light.  Neck:     Musculoskeletal: Normal range of motion and neck supple.  Cardiovascular:     Rate and Rhythm: Normal rate and regular rhythm.     Heart sounds: Normal heart sounds. No murmur.  Pulmonary:     Effort: Pulmonary effort is normal. No respiratory distress.     Breath sounds: Normal breath sounds. No wheezing.     Comments: CTA Wearing 3L POC Musculoskeletal: Normal range of motion.  Skin:  General: Skin is warm and dry.     Findings: No erythema or rash.  Neurological:     General: No focal deficit present.     Mental Status: She is alert. Mental status is at baseline.  Psychiatric:        Mood and Affect: Mood normal.        Behavior: Behavior normal.         Thought Content: Thought content normal.        Judgment: Judgment normal.      Lab Results:  CBC    Component Value Date/Time   WBC 5.7 10/08/2017 1343   RBC 3.94 10/08/2017 1343   HGB 10.8 (L) 10/08/2017 1343   HGB 10.7 (L) 07/29/2017 1115   HCT 34.2 (L) 10/08/2017 1343   HCT 33.2 (L) 07/29/2017 1115   PLT 182 10/08/2017 1343   MCV 86.8 10/08/2017 1343   MCV 85 07/29/2017 1115   MCH 27.4 10/08/2017 1343   MCHC 31.6 10/08/2017 1343   RDW 12.3 10/08/2017 1343   RDW 12.6 07/29/2017 1115   LYMPHSABS 1.3 10/08/2017 1343   LYMPHSABS 2.3 07/29/2017 1115   MONOABS 0.4 10/08/2017 1343   EOSABS 0.1 10/08/2017 1343   EOSABS 0.2 07/29/2017 1115   BASOSABS 0.0 10/08/2017 1343   BASOSABS 0.0 07/29/2017 1115    BMET    Component Value Date/Time   NA 139 12/03/2017 0957   K 3.7 12/03/2017 0957   CL 92 (L) 12/03/2017 0957   CO2 28 12/03/2017 0957   GLUCOSE 160 (H) 12/03/2017 0957   GLUCOSE 205 (H) 11/05/2017 0927   BUN 30 (H) 12/03/2017 0957   CREATININE 1.45 (H) 12/03/2017 0957   CREATININE 1.86 (H) 11/05/2017 0927   CALCIUM 8.5 (L) 12/03/2017 0957   GFRNONAA 38 (L) 12/03/2017 0957   GFRNONAA 28 (L) 11/05/2017 0927   GFRAA 43 (L) 12/03/2017 0957   GFRAA 32 (L) 11/05/2017 0927    BNP    Component Value Date/Time   BNP 644.5 (H) 09/11/2016 0946   BNP 115.6 (H) 07/18/2016 1023    ProBNP    Component Value Date/Time   PROBNP 6,297 (H) 12/03/2017 0957   PROBNP 2,272.0 (H) 03/12/2014 0957    Imaging: Dg Forearm Right  Result Date: 06/11/2018 CLINICAL DATA:  Mid RIGHT forearm pain EXAM: RIGHT FOREARM - 2 VIEW COMPARISON:  None FINDINGS: Osseous mineralization normal. Joint spaces preserved. No fracture, dislocation, or bone destruction. IMPRESSION: Normal exam. Electronically Signed   By: Lavonia Dana M.D.   On: 06/11/2018 08:37     Assessment & Plan:   COPD with asthma ([place]) Ambulatory referral for new nebulizer machine  Ipratropium and albuterol q 6hrs  prn and Singulair  Continue 3L oxygen 24/7  OSA (obstructive sleep apnea) Continue BiPAP Has follow up with Dr. Halford Chessman end of January    Martyn Ehrich, NP 06/12/2018

## 2018-06-16 ENCOUNTER — Other Ambulatory Visit: Payer: Self-pay | Admitting: Internal Medicine

## 2018-06-16 NOTE — Telephone Encounter (Signed)
 *  STAT* If patient is at the pharmacy, call can be transferred to refill team.   1. Which medications need to be refilled? (please list name of each medication and dose if known) metoprolol succinate (TOPROL-XL) 25 MG 24 hr tablet  2. Which pharmacy/location (including street and city if local pharmacy) is medication to be sent to? [facility]  3. Do they need a 30 day or 90 day supply? [place]

## 2018-06-16 NOTE — Telephone Encounter (Signed)
metoprolol succinate (TOPROL-XL) 25 MG 24 hr tablet 180 tablet 3 06/10/2018    Sig - Route: Take 1 tablet (25 mg total) by mouth 2 (two) times daily. - Oral   Sent to pharmacy as: metoprolol succinate (TOPROL-XL) 25 MG 24 hr tablet   E-Prescribing Status: Receipt confirmed by pharmacy (06/10/2018 7:46 AM EST)   Pharmacy   [place], [place]

## 2018-06-23 ENCOUNTER — Ambulatory Visit: Payer: Self-pay | Admitting: Pulmonary Disease

## 2018-06-26 ENCOUNTER — Ambulatory Visit: Payer: Self-pay | Admitting: Pulmonary Disease

## 2018-07-06 ENCOUNTER — Ambulatory Visit (INDEPENDENT_AMBULATORY_CARE_PROVIDER_SITE_OTHER): Payer: Medicare Other | Admitting: Pharmacist

## 2018-07-06 ENCOUNTER — Other Ambulatory Visit: Payer: Self-pay

## 2018-07-06 DIAGNOSIS — I480 Paroxysmal atrial fibrillation: Secondary | ICD-10-CM

## 2018-07-06 LAB — POCT INR: INR: 1.7 — AB (ref 2.0–3.0)

## 2018-07-06 NOTE — Patient Instructions (Signed)
Description   Take 1 1/2 tablets tonight then Continue coumadin 1 tablet daily except 1/2 tablet on Tuesdays Recheck in 4 weeks

## 2018-07-06 NOTE — Patient Outreach (Signed)
[facility]) Care Management  07/06/2018  Daniel Johndrow [facility] [birth year] [id number]    2nd outreach attempt to the patient for assessment.  No answer.  HIPAA compliant voicemail left with contact information  Plan: [place] will make another outreach attempt to the patient within thirty business days.  [name] RN, BSN, [place] Direct Dial:  [phone]  Fax: [phone]

## 2018-07-07 ENCOUNTER — Encounter: Payer: Self-pay | Admitting: Pulmonary Disease

## 2018-07-07 ENCOUNTER — Ambulatory Visit (INDEPENDENT_AMBULATORY_CARE_PROVIDER_SITE_OTHER): Payer: Medicare Other | Admitting: Pulmonary Disease

## 2018-07-07 VITALS — BP 114/58 | HR 74 | Ht 59.0 in | Wt 178.0 lb

## 2018-07-07 DIAGNOSIS — J9611 Chronic respiratory failure with hypoxia: Secondary | ICD-10-CM | POA: Diagnosis not present

## 2018-07-07 DIAGNOSIS — G4733 Obstructive sleep apnea (adult) (pediatric): Secondary | ICD-10-CM | POA: Diagnosis not present

## 2018-07-07 DIAGNOSIS — J449 Chronic obstructive pulmonary disease, unspecified: Secondary | ICD-10-CM

## 2018-07-07 NOTE — Patient Instructions (Signed)
Follow up in 6 months 

## 2018-07-07 NOTE — Progress Notes (Signed)
[facility]  Chief Complaint  Patient presents with  . Follow-up    Pt doing well overall with BIPAP machine.    Constitutional:  BP (!) 114/58 (BP Location: Left Arm, Cuff Size: Normal)   Pulse 74   Ht 4\' 11"  (1.499 m)   Wt 178 lb (80.7 kg)   SpO2 96%   BMI 35.95 kg/m   Past Medical History:  DM, PNA, OA, Non-ischemic CM, Neuropathy, Gout, Colon polyps, HH, GERD, HLD, Fibromyalgia, Depression, Blind, Bipolar, A fib, Anxiety, Anemia  Brief Summary:  Denise Freeman is a 68 y.o. female former smoker with COPD, OSA, and chronic hypoxic/hypercapnic respiratory failure with OHS.  She has been doing well with Bipap.  No issues with mask fit.  Breathing okay.  Gets occasional cough, wheeze, and congestion.  Not having fever, chest pain, or hemoptysis.  Gout symptoms are stable.  Has appointment with new eye doctor next month.  Physical Exam:   Appearance - pleasant  ENMT - clear nasal mucosa, midline nasal  septum, no oral exudates, no LAN, trachea midline, edentulous  Respiratory - normal chest wall, normal respiratory effort, no accessory muscle use, no wheeze/rales, decreased BS  CV - s1s2 regular rate and rhythm, no murmurs, no peripheral edema, radial pulses symmetric  GI - soft, non tender, no masses  Lymph - no adenopathy noted in neck and axillary areas  MSK - uses a walker  Ext - no cyanosis, clubbing, or joint inflammation noted  Skin - no rashes, lesions, or ulcers  Neuro - normal strength, oriented x 3  Psych - normal mood and affect  Assessment/Plan:   GOLD D COPD with emphysema and asthma. - continue ipratropium, albuterol, singulair - chronic prednisone for gout should help with COPD/asthma also  Obstructive sleep apnea. - she is compliant with Bipap and reports benefit - continue Bipap 16/10 cm H2O - will try to get copy of her download  Daytime sleepiness with OSA. - continue modafinil  Chronic  respiratory failure 2nd to COPD and Obesity hypoventilation syndrome. - 3 liters oxygen 02/5  Chronic systolic heart failure. - most recent Echo showed improved ejection fraction  Goals of Care. - DNR/DNI  Patient Instructions  Follow up in 6 months   Chesley Mires, MD [place] Pager: [phone] 07/07/2018, 11:42 AM  Flow Sheet     Pulmonary tests:  PFT 10/24/14 >> FEV1 0.64 (30%), FEV1% 65, TLC 2.92 (64%), DLCO 54%, + BD  Sleep tests:  PSG 1991 Bipap 11/01/15 to 01/29/16 >>used on 89 of 90 nights with average 10 hrs 19 min. Average AHI 2.4 with Bipap 16/10 cm H2O  Cardiac tests:  Echo12/06/19 >> EF 40 to 45%, grade 3 DD, mild MR, PAS 36 mmHg  Medications:   Allergies as of 07/07/2018      Reactions   Xarelto [rivaroxaban] Other (See Comments)   Internal bleeding   Ancef [cefazolin] Nausea And Vomiting   Levaquin [levofloxacin In D5w] Other (See Comments)   "afib"   Lyrica [pregabalin] Hives   Tamiflu [oseltamivir Phosphate] Other (See Comments)   "water blisters"   Zoloft [sertraline Hcl] Other (See Comments)   Jaw problems, jittery   Augmentin [amoxicillin-pot Clavulanate] Itching   Ciprofloxacin Itching, Nausea And Vomiting   Haldol [haloperidol] Other (See Comments)   Restless leg   Nsaids Diarrhea   Penicillins Itching, Nausea And Vomiting, Rash   Has patient had a PCN reaction causing immediate rash, facial/tongue/throat swelling, SOB or  lightheadedness with hypotension: Yes Has patient had a PCN reaction causing severe rash involving mucus membranes or skin necrosis: No Has patient had a PCN reaction that required hospitalization No Has patient had a PCN reaction occurring within the last 10 years: Yes If all of the above answers are "NO", then may proceed with Cephalosporin use.   Topamax [topiramate] Nausea Only      Medication List       Accurate as of July 07, 2018 11:42 AM. Always use your most recent med list.          albuterol (2.5 MG/3ML) 0.083% nebulizer solution Commonly known as:  PROVENTIL USE (1) IN NEBULIZER EVERY 6 HOURS AS NEEDED FOR SHORTNESS OF BREATH.   allopurinol 100 MG tablet Commonly known as:  ZYLOPRIM TAKE 1 TABLET BY MOUTH ONCE DAILY.   BASAGLAR KWIKPEN 100 UNIT/ML Sopn Inject 0.6 mLs (60 Units total) into the skin at bedtime.   cetirizine 10 MG tablet Commonly known as:  ZYRTEC Take 1 tablet (10 mg total) by mouth at bedtime.   cholecalciferol 10 MCG (400 UNIT) Tabs tablet Commonly known as:  VITAMIN D3 Take 1 tablet (400 Units total) by mouth 2 (two) times daily.   colchicine 0.6 MG tablet Take 1 tablet (0.6 mg total) by mouth daily.   colchicine 0.6 MG tablet Take 1 tablet (0.6 mg total) by mouth daily. If have stomach upset, can reduce to every other day.   docusate sodium 100 MG capsule Commonly known as:  COLACE Take 100 mg by mouth daily.   escitalopram 10 MG tablet Commonly known as:  LEXAPRO Take 1 tablet (10 mg total) by mouth at bedtime.   fenofibrate 54 MG tablet TAKE 1 TABLET BY MOUTH ONCE DAILY.   ferrous sulfate 324 (65 Fe) MG Tbec Take 1 tablet by mouth daily.   furosemide 40 MG tablet Commonly known as:  LASIX TAKE (2) TABLETS BY MOUTH THREE TIMES DAILY.   insulin lispro 100 UNIT/ML KiwkPen Commonly known as:  HUMALOG KWIKPEN Inject 0.1-0.16 mLs (10-16 Units total) into the skin 3 (three) times daily before meals.   Insulin Pen Needle 32G X 4 MM Misc Use to inject insulin 5 times daily   ipratropium 0.02 % nebulizer solution Commonly known as:  ATROVENT USE (1) IN NEBULIZER EVERY 6 HOURS AS NEEDED FOR SHORTNESS OF BREATH.   losartan 25 MG tablet Commonly known as:  COZAAR Take 0.5 tablets (12.5 mg total) by mouth daily.   metolazone 2.5 MG tablet Commonly known as:  ZAROXOLYN TAKE 1 TABLET BY MOUTH ONCE DAILY AS NEEDED FOR EDEMA. * TAKE 30 MINUTES BEFORE LASIX*   metoprolol succinate 25 MG 24 hr tablet Commonly known as:   TOPROL-XL Take 1 tablet (25 mg total) by mouth 2 (two) times daily.   mirtazapine 15 MG tablet Commonly known as:  REMERON Take 1 tablet (15 mg total) by mouth at bedtime.   modafinil 200 MG tablet Commonly known as:  PROVIGIL Take 1 tablet (200 mg total) by mouth daily.   mometasone 50 MCG/ACT nasal spray Commonly known as:  NASONEX Place 1 spray into the nose daily.   montelukast 10 MG tablet Commonly known as:  SINGULAIR TAKE ONE TABLET BY MOUTH AT BEDTIME.   mupirocin cream 2 % Commonly known as:  BACTROBAN Apply 1 application topically 2 (two) times daily.   nitroGLYCERIN 0.4 MG SL tablet Commonly known as:  NITROSTAT Place 1 tablet (0.4 mg total) under the tongue every 5 (five)  minutes as needed for chest pain.   nystatin powder Commonly known as:  nystatin Apply topically 2 (two) times daily. Apply under breasts   oxyCODONE-acetaminophen 5-325 MG tablet Commonly known as:  PERCOCET/ROXICET Take 1 tablet by mouth 2 (two) times daily as needed for severe pain.   OXYGEN Inhale 3 L into the lungs continuous. 3 liters 24/7   pantoprazole 20 MG tablet Commonly known as:  PROTONIX TAKE (1) TABLET BY MOUTH TWICE A DAY BEFORE MEALS. (BREAKFAST AND SUPPER)   potassium chloride 10 MEQ tablet Commonly known as:  K-DUR TAKE 1 TABLET BY MOUTH ONCE DAILY.   predniSONE 5 MG tablet Commonly known as:  DELTASONE Take 1 tablet (5 mg total) by mouth daily with breakfast.   PRESCRIPTION MEDICATION Inhale into the lungs See admin instructions. Use BIPAP every time laying down   primidone 50 MG tablet Commonly known as:  MYSOLINE TAKE ONE TABLET BY MOUTH AT BEDTIME.   PROBIOTIC PO Take 1 tablet by mouth at bedtime.   triamcinolone lotion 0.1 % Commonly known as:  KENALOG Apply 1 application topically 3 (three) times daily. For up to 14 days and as needed   TRUE METRIX BLOOD GLUCOSE TEST test strip Generic drug:  glucose blood USE TO TEST BLOOD SUGAR AS DIRECTED.     vitamin A 10000 UNIT capsule Take 10,000 Units by mouth every morning.   warfarin 4 MG tablet Commonly known as:  COUMADIN Take as directed by the anticoagulation clinic. If you are unsure how to take this medication, talk to your nurse or doctor. Original instructions:  Take 1 tablet daily except 1/2 tablet on Thursdays       Past Surgical History:  She  has a past surgical history that includes Heel spur surgery (Bilateral); Bladder suspension; bladder stimulator; Cataract extraction w/PHACO (Right, 11/29/2014); Cataract extraction w/PHACO (Left, 12/13/2014); I&D extremity (Right, 06/13/2015); Tubal ligation; Cervical disc surgery (N/A, 2009); Umbilical hernia repair; Appendectomy; Hernia repair; Esophagogastroduodenoscopy (egd) with propofol (N/A, 03/15/2016); Colonoscopy with propofol (N/A, 03/15/2016); and Cholecystectomy (N/A, 09/13/2016).  Family History:  Her family history includes Bipolar disorder in her brother; Breast cancer in her mother; COPD in her mother and sister; Cancer in her maternal grandfather and maternal grandmother; Diabetes in her mother and sister; Heart disease in her father, maternal grandmother, mother, and sister; Hyperlipidemia in her father.  Social History:  She  reports that she quit smoking about 29 years ago. Her smoking use included cigarettes. She has a 28.00 pack-year smoking history. She has never used smokeless tobacco. She reports that she does not drink alcohol or use drugs.

## 2018-07-08 ENCOUNTER — Other Ambulatory Visit: Payer: Self-pay | Admitting: Internal Medicine

## 2018-07-20 DIAGNOSIS — E876 Hypokalemia: Secondary | ICD-10-CM | POA: Diagnosis not present

## 2018-07-20 DIAGNOSIS — M79602 Pain in left arm: Secondary | ICD-10-CM | POA: Diagnosis not present

## 2018-07-31 ENCOUNTER — Other Ambulatory Visit: Payer: Self-pay | Admitting: Internal Medicine

## 2018-08-03 ENCOUNTER — Other Ambulatory Visit: Payer: Self-pay | Admitting: Internal Medicine

## 2018-08-03 NOTE — Telephone Encounter (Signed)
° ° °*  STAT* If patient is at the pharmacy, call can be transferred to refill team.   1. Which medications need to be refilled? (please list name of each medication and dose if known)  metoprolol succinate (TOPROL-XL) 25 MG 24 hr tablet  2. Which pharmacy/location (including street and city if local pharmacy) is medication to be sent to? [place], [place]  3. Do they need a 30 day or 90 day supply? [place]

## 2018-08-04 ENCOUNTER — Other Ambulatory Visit: Payer: Self-pay | Admitting: Internal Medicine

## 2018-08-04 MED ORDER — METOPROLOL SUCCINATE ER 25 MG PO TB24: 25.0000 mg | ORAL_TABLET | Freq: Two times a day (BID) | ORAL | 1 refills | Status: DC

## 2018-08-05 ENCOUNTER — Ambulatory Visit (INDEPENDENT_AMBULATORY_CARE_PROVIDER_SITE_OTHER): Payer: Medicare Other | Admitting: *Deleted

## 2018-08-05 DIAGNOSIS — Z5181 Encounter for therapeutic drug level monitoring: Secondary | ICD-10-CM

## 2018-08-05 DIAGNOSIS — I480 Paroxysmal atrial fibrillation: Secondary | ICD-10-CM | POA: Diagnosis not present

## 2018-08-05 LAB — POCT INR: INR: 2.1 (ref 2.0–3.0)

## 2018-08-05 NOTE — Patient Instructions (Signed)
Continue coumadin 1 tablet daily except 1/2 tablet on Tuesdays Recheck in 4 weeks

## 2018-08-06 ENCOUNTER — Other Ambulatory Visit: Payer: Self-pay

## 2018-08-06 NOTE — Patient Outreach (Signed)
[place] [facility]) Care Management  08/06/2018  [name] [facility] 11-22-1950 [id number]    3rd attempt to outreach the patient. No answer. HIPAA compliant voicemail left.  The patient did call me and left a message on my cell to let me know that she had to go and pay bills this afternoon.  Plan:  RN Health Coach will outreach the patient in one to two business days.  [name] RN, BSN, [place] Direct Dial:  [phone]  Fax: [phone]

## 2018-08-10 ENCOUNTER — Other Ambulatory Visit: Payer: Self-pay

## 2018-08-10 NOTE — Patient Outreach (Signed)
[facility]) Care Management  08/10/2018   Denise Freeman [facility] [birth year] [id number]  Subjective: Successful call to the patient for assessment.  HIPAA verified. The patient states that she did not sleep well last night.  She states " I just didn't feel well."  She states that she has been having lower back pain that she rates at about a 8/10.  She has medication that she takes to help with the pain.She denies any falls.  The patient states that she had not checked her FBS this morning but they have been ranging between 130-145.  She states that she has not had any lows. She states that she has been monitoring her diet.  She takes her medication as prescribed. She has a follow up appointment with Dr. [place] Crane this month.    Current Medications:  Current Outpatient Medications  Medication Sig Dispense Refill  . albuterol (PROVENTIL) (2.5 MG/3ML) 0.083% nebulizer solution USE (1) IN NEBULIZER EVERY 6 HOURS AS NEEDED FOR SHORTNESS OF BREATH. 75 mL 2  . allopurinol (ZYLOPRIM) 100 MG tablet TAKE 1 TABLET BY MOUTH ONCE DAILY. 30 tablet 0  . cetirizine (ZYRTEC) 10 MG tablet Take 1 tablet (10 mg total) by mouth at bedtime. 30 tablet 11  . cholecalciferol (VITAMIN D) 400 units TABS tablet Take 1 tablet (400 Units total) by mouth 2 (two) times daily. 180 each 3  . colchicine 0.6 MG tablet Take 1 tablet (0.6 mg total) by mouth daily. 30 tablet 1  . colchicine 0.6 MG tablet Take 1 tablet (0.6 mg total) by mouth daily. If have stomach upset, can reduce to every other day. 30 tablet 0  . docusate sodium (COLACE) 100 MG capsule Take 100 mg by mouth daily.    Marland Kitchen escitalopram (LEXAPRO) 10 MG tablet Take 1 tablet (10 mg total) by mouth at bedtime. 90 tablet 0  . fenofibrate 54 MG tablet TAKE 1 TABLET BY MOUTH ONCE DAILY. 28 tablet 3  . ferrous sulfate 324 (65 Fe) MG TBEC Take 1 tablet by mouth daily.    . furosemide (LASIX) 40 MG tablet TAKE (2) TABLETS BY MOUTH THREE TIMES DAILY. 180 tablet 5  .  Insulin Glargine (BASAGLAR KWIKPEN) 100 UNIT/ML SOPN Inject 0.6 mLs (60 Units total) into the skin at bedtime. 15 mL 2  . insulin lispro (HUMALOG KWIKPEN) 100 UNIT/ML KiwkPen Inject 0.1-0.16 mLs (10-16 Units total) into the skin 3 (three) times daily before meals. 15 mL 2  . Insulin Pen Needle 32G X 4 MM MISC Use to inject insulin 5 times daily 500 each 5  . ipratropium (ATROVENT) 0.02 % nebulizer solution USE (1) IN NEBULIZER EVERY 6 HOURS AS NEEDED FOR SHORTNESS OF BREATH. 75 mL 2  . metolazone (ZAROXOLYN) 2.5 MG tablet TAKE 1 TABLET BY MOUTH ONCE DAILY AS NEEDED FOR EDEMA. * TAKE 30 MINUTES BEFORE LASIX* 90 tablet 2  . metoprolol succinate (TOPROL-XL) 25 MG 24 hr tablet Take 1 tablet (25 mg total) by mouth 2 (two) times daily. 180 tablet 1  . mirtazapine (REMERON) 15 MG tablet Take 1 tablet (15 mg total) by mouth at bedtime. 90 tablet 0  . modafinil (PROVIGIL) 200 MG tablet Take 1 tablet (200 mg total) by mouth daily. 30 tablet 5  . mometasone (NASONEX) 50 MCG/ACT nasal spray Place 1 spray into the nose daily. 17 g 11  . montelukast (SINGULAIR) 10 MG tablet TAKE ONE TABLET BY MOUTH AT BEDTIME. 30 tablet 6  . mupirocin cream (BACTROBAN) 2 % Apply 1  application topically 2 (two) times daily. 15 g 0  . nitroGLYCERIN (NITROSTAT) 0.4 MG SL tablet Place 1 tablet (0.4 mg total) under the tongue every 5 (five) minutes as needed for chest pain. 25 tablet 3  . nystatin (NYSTATIN) powder Apply topically 2 (two) times daily. Apply under breasts 15 g 3  . oxyCODONE-acetaminophen (PERCOCET/ROXICET) 5-325 MG tablet Take 1 tablet by mouth 2 (two) times daily as needed for severe pain. 60 tablet 0  . OXYGEN Inhale 3 L into the lungs continuous. 3 liters 24/7    . pantoprazole (PROTONIX) 20 MG tablet TAKE (1) TABLET BY MOUTH TWICE A DAY BEFORE MEALS. (BREAKFAST AND SUPPER) 56 tablet 0  . potassium chloride (K-DUR) 10 MEQ tablet TAKE 1 TABLET BY MOUTH ONCE DAILY. 28 tablet 10  . predniSONE (DELTASONE) 5 MG tablet  Take 1 tablet (5 mg total) by mouth daily with breakfast. 30 tablet 2  . PRESCRIPTION MEDICATION Inhale into the lungs See admin instructions. Use BIPAP every time laying down    . primidone (MYSOLINE) 50 MG tablet TAKE ONE TABLET BY MOUTH AT BEDTIME. 30 tablet 5  . Probiotic Product (PROBIOTIC PO) Take 1 tablet by mouth at bedtime.    . triamcinolone lotion (KENALOG) 0.1 % Apply 1 application topically 3 (three) times daily. For up to 14 days and as needed 60 mL 1  . TRUE METRIX BLOOD GLUCOSE TEST test strip USE TO TEST BLOOD SUGAR AS DIRECTED. 50 each 2  . vitamin A 10000 UNIT capsule Take 10,000 Units by mouth every morning.    . warfarin (COUMADIN) 4 MG tablet TAKE 1 TABLET BY MOUTH ONCE DAILY EXCEPT ON THURSDAY TAKE 1/2 TABLET 30 tablet 0  . losartan (COZAAR) 25 MG tablet Take 0.5 tablets (12.5 mg total) by mouth daily. 45 tablet 2   No current facility-administered medications for this visit.     Functional Status:  No flowsheet data found.  Fall/Depression Screening: Fall Risk  08/10/2018 04/29/2018 12/26/2017  Falls in the past year? 0 1 No  Number falls in past yr: - 0 -  Injury with Fall? - 0 -  Comment - - -  Risk Factor Category  - - -  Comment - - -  Risk for fall due to : - - -  Risk for fall due to: Comment - - -  Follow up - - -   PHQ 2/9 Scores 11/28/2017 11/27/2017 10/03/2017 09/15/2017 09/05/2017 08/07/2017 07/29/2017  PHQ - 2 Score 0 5 6 0 0 0 0  PHQ- 9 Score - - 23 - - - -    Assessment: Patient will continue to benefit from health coach outreach for disease management and support. [facility] CM Care Plan Problem One     Most Recent Value  [facility] Long Term Goal   IN 90 days the patient will lower her a1c  8.6 by 1 to 2 points  [facility] Long Term Goal Start Date  08/10/18  Interventions for Problem One Long Term Goal  Discussed FBS, encouraged adherence to check her blood sugars daily and take medications       Plan: [facility] will contact patient in the month of June and  patient agrees to next outreach.   [name] RN, BSN, [place] Direct Dial:  [phone]  Fax: [phone]

## 2018-09-01 ENCOUNTER — Telehealth: Payer: Self-pay | Admitting: *Deleted

## 2018-09-01 NOTE — Telephone Encounter (Signed)
° ° °   °  _____________   OIZTI-45 Pre-Screening Questions:   Do you currently have a fever? No (yes = cancel and refer to pcp for e-visit)  Have you recently travelled on a cruise, internationally, or to [place], [place], [place], [place], [place], or [place], [place] [employer]) ?NO (yes = cancel, stay home, monitor symptoms, and contact pcp or initiate e-visit if symptoms develop)  Have you been in contact with someone that is currently pending confirmation of Covid19 testing or has been confirmed to have the [place] virus?  NO (yes = cancel, stay home, away from tested individual, monitor symptoms, and contact pcp or initiate e-visit if symptoms develop)  Are you currently experiencing fatigue or cough? NO (yes = pt should be prepared to have a mask placed at the time of their visit).

## 2018-09-02 ENCOUNTER — Other Ambulatory Visit: Payer: Self-pay

## 2018-09-02 ENCOUNTER — Ambulatory Visit (INDEPENDENT_AMBULATORY_CARE_PROVIDER_SITE_OTHER): Payer: Medicare Other | Admitting: *Deleted

## 2018-09-02 DIAGNOSIS — F319 Bipolar disorder, unspecified: Secondary | ICD-10-CM | POA: Diagnosis not present

## 2018-09-02 DIAGNOSIS — G894 Chronic pain syndrome: Secondary | ICD-10-CM | POA: Diagnosis not present

## 2018-09-02 DIAGNOSIS — I482 Chronic atrial fibrillation, unspecified: Secondary | ICD-10-CM | POA: Diagnosis not present

## 2018-09-02 DIAGNOSIS — E1165 Type 2 diabetes mellitus with hyperglycemia: Secondary | ICD-10-CM | POA: Diagnosis not present

## 2018-09-02 DIAGNOSIS — J9610 Chronic respiratory failure, unspecified whether with hypoxia or hypercapnia: Secondary | ICD-10-CM | POA: Diagnosis not present

## 2018-09-02 DIAGNOSIS — I5022 Chronic systolic (congestive) heart failure: Secondary | ICD-10-CM | POA: Diagnosis not present

## 2018-09-02 DIAGNOSIS — D631 Anemia in chronic kidney disease: Secondary | ICD-10-CM | POA: Diagnosis not present

## 2018-09-02 DIAGNOSIS — E785 Hyperlipidemia, unspecified: Secondary | ICD-10-CM | POA: Diagnosis not present

## 2018-09-02 DIAGNOSIS — Z5181 Encounter for therapeutic drug level monitoring: Secondary | ICD-10-CM | POA: Diagnosis not present

## 2018-09-02 DIAGNOSIS — K219 Gastro-esophageal reflux disease without esophagitis: Secondary | ICD-10-CM | POA: Diagnosis not present

## 2018-09-02 DIAGNOSIS — E1122 Type 2 diabetes mellitus with diabetic chronic kidney disease: Secondary | ICD-10-CM | POA: Diagnosis not present

## 2018-09-02 DIAGNOSIS — N183 Chronic kidney disease, stage 3 (moderate): Secondary | ICD-10-CM | POA: Diagnosis not present

## 2018-09-02 DIAGNOSIS — I480 Paroxysmal atrial fibrillation: Secondary | ICD-10-CM

## 2018-09-02 DIAGNOSIS — J449 Chronic obstructive pulmonary disease, unspecified: Secondary | ICD-10-CM | POA: Diagnosis not present

## 2018-09-02 LAB — POCT INR: INR: 2.3 (ref 2.0–3.0)

## 2018-09-02 NOTE — Patient Instructions (Signed)
Continue coumadin 1 tablet daily except 1/2 tablet on Tuesdays Recheck in 4 weeks

## 2018-09-07 ENCOUNTER — Other Ambulatory Visit: Payer: Self-pay | Admitting: Pulmonary Disease

## 2018-09-07 ENCOUNTER — Other Ambulatory Visit: Payer: Self-pay | Admitting: Internal Medicine

## 2018-09-15 ENCOUNTER — Telehealth: Payer: Self-pay | Admitting: Internal Medicine

## 2018-09-15 NOTE — Telephone Encounter (Signed)
Ok thanks 

## 2018-09-15 NOTE — Telephone Encounter (Addendum)
Home phone/ declined my chart/consented to virtual/ pre reg completed

## 2018-09-15 NOTE — Telephone Encounter (Signed)
Pt says she has had a flutter in her chest over the past three days that comes and goes. She also has had some swelling in her legs, but has been taking her lasix.  She also had an infection and has been put on antibiotics by Dr. Juel Burrow office for blisters that get filled with fluid then pop. She was put on Clindamycin 150 mg 2x daily for 7 days. The first antibiotic she was put on caused an allergic reaction.  She wants to know if the flutter and chest pain are being caused by a reaction between her current medication and the antibiotics, or if there is another issue going on. She does not want to go to the hospital and catch COVID.

## 2018-09-15 NOTE — Telephone Encounter (Signed)
Returned call to patient.She stated she has been having chest pain off and on for the past 3 days,each episode last appox 30 mins. No chest pain at present.She also complaining of breaking out in blisters different parts of her body.Stated blisters turn into sores that are very painful.She currently has blister across her forehead.She just started taking Clindamycin 150 mg  twice a day today.She is concerned she is allergic to one of her medications.Message sent to Dr.Hilty for advice.

## 2018-09-15 NOTE — Telephone Encounter (Signed)
Could be an antibiotic reaction - would be expected to stop after she finishes it. I would have her continue to monitor her symptoms and call us if they worsen.  Dr. Lemmie Evens

## 2018-09-15 NOTE — Telephone Encounter (Signed)
Returned call to patient Dr.Hilty's advice given.Stated she just started taking clindamycin she has only had 3 tablets.Stated she would like a telephone visit with Dr.Hilty to discuss.Tele visit scheduled with Dr.Hilty 4/15 at 9:45 am.

## 2018-09-16 ENCOUNTER — Telehealth (INDEPENDENT_AMBULATORY_CARE_PROVIDER_SITE_OTHER): Payer: Medicare Other | Admitting: Internal Medicine

## 2018-09-16 ENCOUNTER — Encounter: Payer: Self-pay | Admitting: Internal Medicine

## 2018-09-16 DIAGNOSIS — I4821 Permanent atrial fibrillation: Secondary | ICD-10-CM | POA: Diagnosis not present

## 2018-09-16 DIAGNOSIS — R0789 Other chest pain: Secondary | ICD-10-CM | POA: Diagnosis not present

## 2018-09-16 DIAGNOSIS — I5022 Chronic systolic (congestive) heart failure: Secondary | ICD-10-CM

## 2018-09-16 DIAGNOSIS — M797 Fibromyalgia: Secondary | ICD-10-CM | POA: Diagnosis not present

## 2018-09-16 MED ORDER — LOSARTAN POTASSIUM 25 MG PO TABS: 12.5000 mg | ORAL_TABLET | Freq: Every day | ORAL | 3 refills | Status: DC

## 2018-09-16 NOTE — Patient Instructions (Signed)
Medication Instructions:  Restart: Losartan 12.5 mg daily  If you need a refill on your cardiac medications before your next appointment, please call your pharmacy.   Lab work: None  Testing/Procedures: None  Follow-Up: Your physician recommends that you schedule a follow-up appointment in 6 months with [name].

## 2018-09-16 NOTE — Progress Notes (Signed)
Virtual Visit via Telephone Note   This visit type was conducted due to national recommendations for restrictions regarding the COVID-19 Pandemic (e.g. social distancing) in an effort to limit this patient's exposure and mitigate transmission in our community.  Due to her co-morbid illnesses, this patient is at least at moderate risk for complications without adequate follow up.  This format is felt to be most appropriate for this patient at this time.  The patient did not have access to video technology/had technical difficulties with video requiring transitioning to audio format only (telephone).  All issues noted in this document were discussed and addressed.  No physical exam could be performed with this format.  Please refer to the patient's chart for her  consent to telehealth for [facility].   Evaluation Performed:  Telephone visit  Date:  09/16/2018   ID:  Denise Freeman, DOB Dec 21, 1950, MRN [id number]  Patient Location:  [place] [address]  Provider location:   [address] [place], [place] [address]  PCP:  Celene Squibb, MD  Cardiologist:  No primary care provider on file. Electrophysiologist:  None   Chief Complaint:  Palpitations, sharp chest pain  History of Present Illness:    Denise Freeman is a 68 y.o. female who presents via audio/video conferencing for a telehealth visit today.  Refill is seen today via telephone visit for symptoms of left-sided chest pain and palpitations.  Recently she has been struggling with recurrent skin infections.  She was placed on steroids and clindamycin.  She does report some improvement in her skin blisters, but has had some palpitations and tachycardia.  She has permanent A. fib and her rate appears to be well controlled today.  With regards her chest pain, this is left-sided, sharp and intermittent, worse somewhat with movement but not necessarily exertion.  She has no known coronary disease and has had  multiple cardiac catheterizations over the past 20 years demonstrated no significant obstructive coronary disease.  She has a nonischemic cardiomyopathy with EF 30 to 35% and is on high-dose diuretics, Lasix 80 mg 3 times daily and metolazone 2.5 mg daily.  She does appear to be euvolemic on this dose in fact her weight is actually 4 pounds lighter than it was in February.  The patient does not have symptoms concerning for COVID-19 infection (fever, chills, cough, or new SHORTNESS OF BREATH).    Prior CV studies:   The following studies were reviewed today:  Labs  PMHx:  Past Medical History:  Diagnosis Date   Allergy    Anemia    Anxiety    Asthma    Atrial fibrillation ([place])    Bipolar 1 disorder ([place])    Blind    "partially in both eyes" (03/14/2016)   Cholelithiasis    a. 09/2016 s/p Lap Chole.   Chronic bronchitis (HCC)    Chronic combined systolic and diastolic congestive heart failure ([place])    a. 09/2016 Echo: EF 30-35%.   Colon polyps    COPD (chronic obstructive pulmonary disease) (HCC)    Depression    Family history of adverse reaction to anesthesia    Uncle was positive for malignant hyperthermia; patient had testing done and was negative.   Fibromyalgia    GERD (gastroesophageal reflux disease)    Gout    High cholesterol    History of blood transfusion 06/2015   "bleeding from my rectum"   History of hiatal hernia    HOH (  hard of hearing)    Hx of colonic polyps 03/21/2016   3 small adenomas no recall - co-morbidities   Neuropathy    Disc Back    NICM (nonischemic cardiomyopathy) ([place])    a. Previously worked up in [place], [place]-->low EF with subsequent recovery.  Multiple caths (last ~ 2014 per pt report)--reportedly nl cors;  b. 09/2016 Echo: EF 30-35%, antsept/apical HK, mild MR, mildly dil LA, mod dil RA;  c. 09/2016 Lexi MV: EF 26%, glob HK, sept DK, med size, mod intensity fixed septal defect - BBB/PVC related artifact, no ischemia.     On home oxygen therapy    "3L; 24/7" (03/14/2016)   OSA treated with BiPAP    uses biPAP, 10 (03/14/2016)   Osteoarthritis    Oxygen deficiency    Pneumonia    Type II diabetes mellitus ([place])     Past Surgical History:  Procedure Laterality Date   APPENDECTOMY     "they busted"   bladder stimulator     pt states, "it cannot be turned off; it's in my right hip; dead battery so it's not working anymore". (03/14/2016)   BLADDER SUSPENSION     2003, 2006 and 2010   CATARACT EXTRACTION W/PHACO Right 11/29/2014   Procedure: CATARACT EXTRACTION PHACO AND INTRAOCULAR LENS PLACEMENT (IOC);  Surgeon: Rutherford Guys, MD;  Location: [facility];  Service: Ophthalmology;  Laterality: Right;  CDE:3.81   CATARACT EXTRACTION W/PHACO Left 12/13/2014   Procedure: CATARACT EXTRACTION PHACO AND INTRAOCULAR LENS PLACEMENT (IOC);  Surgeon: Rutherford Guys, MD;  Location: [facility];  Service: Ophthalmology;  Laterality: Left;  CDE:6.59   CERVICAL DISC SURGERY N/A 2009   4, 6, and 7 cervical disc replaced   CHOLECYSTECTOMY N/A 09/13/2016   Procedure: LAPAROSCOPIC CHOLECYSTECTOMY;  Surgeon: Rolm Bookbinder, MD;  Location: [facility];  Service: General;  Laterality: N/A;   COLONOSCOPY WITH PROPOFOL N/A 03/15/2016   Procedure: COLONOSCOPY WITH PROPOFOL;  Surgeon: Gatha Mayer, MD;  Location: [facility];  Service: Endoscopy;  Laterality: N/A;   ESOPHAGOGASTRODUODENOSCOPY (EGD) WITH PROPOFOL N/A 03/15/2016   Procedure: ESOPHAGOGASTRODUODENOSCOPY (EGD) WITH PROPOFOL;  Surgeon: Gatha Mayer, MD;  Location: [facility];  Service: Endoscopy;  Laterality: N/A;   HEEL SPUR SURGERY Bilateral    HERNIA REPAIR     I&D EXTREMITY Right 06/13/2015   Procedure: MINOR IRRIGATION AND DEBRIDEMENT EXTREMITY REMOVAL OF NAIL;  Surgeon: Daryll Brod, MD;  Location: [facility];  Service: Orthopedics;  Laterality: Right;   TUBAL LIGATION     UMBILICAL HERNIA REPAIR     w/mesh    FAMHx:  Family History   Problem Relation Age of Onset   Heart disease Mother    COPD Mother    Diabetes Mother    Breast cancer Mother    Heart disease Father    Hyperlipidemia Father    COPD Sister    Heart disease Sister    Diabetes Sister    Heart disease Maternal Grandmother    Cancer Maternal Grandmother        stomach   Cancer Maternal Grandfather        lung    Bipolar disorder Brother     SOCHx:   reports that she quit smoking about 29 years ago. Her smoking use included cigarettes. She has a 28.00 pack-year smoking history. She has never used smokeless tobacco. She reports that she does not drink alcohol or use drugs.  ALLERGIES:  Allergies  Allergen Reactions   Xarelto [Rivaroxaban] Other (  See Comments)    Internal bleeding   Ancef [Cefazolin] Nausea And Vomiting   Levaquin [Levofloxacin In D5w] Other (See Comments)    "afib"   Lyrica [Pregabalin] Hives   Tamiflu [Oseltamivir Phosphate] Other (See Comments)    "water blisters"   Zoloft [Sertraline Hcl] Other (See Comments)    Jaw problems, jittery   Augmentin [Amoxicillin-Pot Clavulanate] Itching   Ciprofloxacin Itching and Nausea And Vomiting   Haldol [Haloperidol] Other (See Comments)    Restless leg   Nsaids Diarrhea   Penicillins Itching, Nausea And Vomiting and Rash    Has patient had a PCN reaction causing immediate rash, facial/tongue/throat swelling, SOB or lightheadedness with hypotension: Yes Has patient had a PCN reaction causing severe rash involving mucus membranes or skin necrosis: No Has patient had a PCN reaction that required hospitalization No Has patient had a PCN reaction occurring within the last 10 years: Yes If all of the above answers are "NO", then may proceed with Cephalosporin use.   Topamax [Topiramate] Nausea Only    MEDS:  Current Meds  Medication Sig   albuterol (PROVENTIL) (2.5 MG/3ML) 0.083% nebulizer solution USE (1) IN NEBULIZER EVERY 6 HOURS AS NEEDED FOR SHORTNESS  OF BREATH.   allopurinol (ZYLOPRIM) 100 MG tablet TAKE 1 TABLET BY MOUTH ONCE DAILY.   cetirizine (ZYRTEC) 10 MG tablet Take 1 tablet (10 mg total) by mouth at bedtime.   cholecalciferol (VITAMIN D) 400 units TABS tablet Take 1 tablet (400 Units total) by mouth 2 (two) times daily.   colchicine 0.6 MG tablet Take 1 tablet (0.6 mg total) by mouth daily. (Patient taking differently: Take 0.6 mg by mouth as needed. )   escitalopram (LEXAPRO) 10 MG tablet Take 1 tablet (10 mg total) by mouth at bedtime.   fenofibrate 160 MG tablet Take 160 mg by mouth daily.   ferrous sulfate 324 (65 Fe) MG TBEC Take 1 tablet by mouth daily.   furosemide (LASIX) 40 MG tablet TAKE (2) TABLETS BY MOUTH THREE TIMES DAILY.   Insulin Glargine (BASAGLAR KWIKPEN) 100 UNIT/ML SOPN Inject 0.6 mLs (60 Units total) into the skin at bedtime.   insulin lispro (HUMALOG KWIKPEN) 100 UNIT/ML KiwkPen Inject 0.1-0.16 mLs (10-16 Units total) into the skin 3 (three) times daily before meals.   Insulin Pen Needle 32G X 4 MM MISC Use to inject insulin 5 times daily   ipratropium (ATROVENT) 0.02 % nebulizer solution USE (1) IN NEBULIZER EVERY 6 HOURS AS NEEDED FOR SHORTNESS OF BREATH.   metolazone (ZAROXOLYN) 2.5 MG tablet TAKE 1 TABLET BY MOUTH ONCE DAILY AS NEEDED FOR EDEMA. * TAKE 30 MINUTES BEFORE LASIX* (Patient taking differently: daily. TAKE 1 TABLET BY MOUTH ONCE DAILY AS NEEDED FOR EDEMA. * TAKE 30 MINUTES BEFORE LASIX*)   metoprolol succinate (TOPROL-XL) 25 MG 24 hr tablet Take 1 tablet (25 mg total) by mouth 2 (two) times daily.   mirtazapine (REMERON) 15 MG tablet Take 1 tablet (15 mg total) by mouth at bedtime.   modafinil (PROVIGIL) 200 MG tablet Take 1 tablet (200 mg total) by mouth daily.   mometasone (NASONEX) 50 MCG/ACT nasal spray Place 1 spray into the nose daily.   montelukast (SINGULAIR) 10 MG tablet TAKE ONE TABLET BY MOUTH AT BEDTIME.   mupirocin cream (BACTROBAN) 2 % Apply 1 application topically  2 (two) times daily.   nystatin (NYSTATIN) powder Apply topically 2 (two) times daily. Apply under breasts   oxyCODONE-acetaminophen (PERCOCET/ROXICET) 5-325 MG tablet Take 1 tablet by mouth  2 (two) times daily as needed for severe pain.   OXYGEN Inhale 3 L into the lungs continuous. 3 liters 24/7   pantoprazole (PROTONIX) 20 MG tablet TAKE (1) TABLET BY MOUTH TWICE A DAY BEFORE MEALS. (BREAKFAST AND SUPPER)   potassium chloride (K-DUR) 10 MEQ tablet TAKE 1 TABLET BY MOUTH ONCE DAILY.   predniSONE (DELTASONE) 5 MG tablet Take 1 tablet (5 mg total) by mouth daily with breakfast.   PRESCRIPTION MEDICATION Inhale into the lungs See admin instructions. Use BIPAP every time laying down   primidone (MYSOLINE) 50 MG tablet TAKE ONE TABLET BY MOUTH AT BEDTIME.   Probiotic Product (PROBIOTIC PO) Take 1 tablet by mouth at bedtime.   triamcinolone lotion (KENALOG) 0.1 % Apply 1 application topically 3 (three) times daily. For up to 14 days and as needed   TRUE METRIX BLOOD GLUCOSE TEST test strip USE TO TEST BLOOD SUGAR AS DIRECTED.   Vitamin A 2400 MCG (8000 UT) CAPS Take 8,000 Units by mouth every morning.      ROS: Pertinent items noted in HPI and remainder of comprehensive ROS otherwise negative.  Labs/Other Tests and Data Reviewed:    Recent Labs: 10/08/2017: Hemoglobin 10.8; Platelets 182 11/13/2017: ALT 12 12/03/2017: BUN 30; Creatinine, Ser 1.45; NT-Pro BNP 6,297; Potassium 3.7; Sodium 139   Recent Lipid Panel Lab Results  Component Value Date/Time   CHOL 163 07/29/2017 11:15 AM   TRIG 183 (H) 07/29/2017 11:15 AM   HDL 40 07/29/2017 11:15 AM   CHOLHDL 4.1 07/29/2017 11:15 AM   CHOLHDL 6.2 09/10/2016 11:47 AM   LDLCALC 86 07/29/2017 11:15 AM   LDLDIRECT 153.0 04/19/2016 09:32 AM    Wt Readings from Last 3 Encounters:  09/16/18 174 lb (78.9 kg)  07/07/18 178 lb (80.7 kg)  06/12/18 177 lb 9.6 oz (80.6 kg)     Exam:    Vital Signs:  BP 140/65    Pulse 90    Ht 4\' 11"   (1.499 m)    Wt 174 lb (78.9 kg)    BMI 35.14 kg/m    exam not performed due to telephone visit  ASSESSMENT & PLAN:    1. Permanent atrial fibrillation - not on anticoagulation due to recent GI bleeding and vision loss attributed to stroke 2. Acute on Chronic combined congestive heart failure - EF 30-35% 3. History of stress-induced cardiomyopathy 4. No significant obstructive coronary disease after multiple catheterizations in 1996, 99, 2007 and 2014. 5. Fibromyalgia 6. Bipolar 1 disorder 7. Dyslipidemia 8. Hypertension 9. Severe COPD on home oxygen 10. Obstructive sleep apnea on CPAP 11. Morbid obesity - with weight loss recently 12. DNR  Denise Freeman has been describing atypical, sharp chest pain, worse with movements.  She has a history of multiple prior catheterizations as outlined above with no significant obstructive coronary disease.  I discontinued her nitroglycerin as is not indicated based on the lack of coronary disease.  I do not feel that this is a cardiac chest pain, rather she has been on steroids and antibiotics for recurrent skin infection.  She does think this is improving, but slowly.  She does not report any palpitations and does have a history of permanent A. fib.  She may have had some RVR related to steroid use but her heart rate is well controlled today.  For some reason her losartan prescription expired.  She was on 12.5 mg daily.  I would like to restart this.  Otherwise no changes to her medications.  Follow-up with  me in 6 months.  COVID-19 Education: The signs and symptoms of COVID-19 were discussed with the patient and how to seek care for testing (follow up with PCP or arrange E-visit).  The importance of social distancing was discussed today.  Patient Risk:   After full review of this patients clinical status, I feel that they are at least moderate risk at this time.  Time:   Today, I have spent 25 minutes with the patient with telehealth technology discussing  sharp chest pain, palpitations, skin infection, antibiotics, steroid use.     Medication Adjustments/Labs and Tests Ordered: Current medicines are reviewed at length with the patient today.  Concerns regarding medicines are outlined above.   Tests Ordered: No orders of the defined types were placed in this encounter.   Medication Changes: No orders of the defined types were placed in this encounter.   Disposition:  in 6 month(s)  Pixie Casino, MD, [facility], [place] Director of the Advanced Lipid Disorders &  Cardiovascular Risk Reduction Clinic Diplomate of the American Board of Clinical Lipidology Attending Cardiologist  Direct Dial: [phone]   Fax: [phone]  Website:  [email or web address]  Pixie Casino, MD  09/16/2018 10:04 AM

## 2018-09-22 DIAGNOSIS — M1000 Idiopathic gout, unspecified site: Secondary | ICD-10-CM | POA: Diagnosis not present

## 2018-09-24 ENCOUNTER — Telehealth: Payer: Self-pay | Admitting: Pulmonary Disease

## 2018-09-24 DIAGNOSIS — G4733 Obstructive sleep apnea (adult) (pediatric): Secondary | ICD-10-CM

## 2018-09-24 NOTE — Telephone Encounter (Signed)
Call returned to patient, she states she needs the connector piece that attaches her oxygen to her bi-pap machine however she states [facility] takes too long and she would like to get the order sent to [employer] in [place] for her to go by store to pick up. Made patient aware I am not sure if she is able to get supplies from a different company due to insurance regulations. Patient states she has had to do this in the past. Made aware I would call her back once order has been sent. Patient is requesting that we place the order for her to have 2-3 at a time because they often break.     BM can we send order for bi-pap oxygen tubing connector.

## 2018-09-24 NOTE — Telephone Encounter (Signed)
Order placed. Call made to patient to make aware. Voiced understanding. Nothing further is needed at this time.

## 2018-09-24 NOTE — Telephone Encounter (Signed)
Ok to sign  Denise Freeman

## 2018-10-05 ENCOUNTER — Other Ambulatory Visit: Payer: Self-pay

## 2018-10-05 ENCOUNTER — Ambulatory Visit (INDEPENDENT_AMBULATORY_CARE_PROVIDER_SITE_OTHER): Payer: Medicare Other | Admitting: *Deleted

## 2018-10-05 DIAGNOSIS — I480 Paroxysmal atrial fibrillation: Secondary | ICD-10-CM | POA: Diagnosis not present

## 2018-10-05 DIAGNOSIS — Z5181 Encounter for therapeutic drug level monitoring: Secondary | ICD-10-CM

## 2018-10-05 LAB — POCT INR: INR: 1.8 — AB (ref 2.0–3.0)

## 2018-10-05 NOTE — Patient Instructions (Signed)
Take coumadin 1 1/2 tablets tonight then resume 1 tablet daily except 1/2 tablet on Tuesdays Recheck in 4 weeks

## 2018-10-10 ENCOUNTER — Other Ambulatory Visit: Payer: Self-pay | Admitting: Pulmonary Disease

## 2018-10-12 DIAGNOSIS — E782 Mixed hyperlipidemia: Secondary | ICD-10-CM | POA: Diagnosis not present

## 2018-10-12 DIAGNOSIS — N183 Chronic kidney disease, stage 3 (moderate): Secondary | ICD-10-CM | POA: Diagnosis not present

## 2018-10-12 DIAGNOSIS — L989 Disorder of the skin and subcutaneous tissue, unspecified: Secondary | ICD-10-CM | POA: Diagnosis not present

## 2018-10-12 DIAGNOSIS — J441 Chronic obstructive pulmonary disease with (acute) exacerbation: Secondary | ICD-10-CM | POA: Diagnosis not present

## 2018-10-12 DIAGNOSIS — E1122 Type 2 diabetes mellitus with diabetic chronic kidney disease: Secondary | ICD-10-CM | POA: Diagnosis not present

## 2018-10-12 DIAGNOSIS — E559 Vitamin D deficiency, unspecified: Secondary | ICD-10-CM | POA: Diagnosis not present

## 2018-10-12 DIAGNOSIS — E785 Hyperlipidemia, unspecified: Secondary | ICD-10-CM | POA: Diagnosis not present

## 2018-10-12 DIAGNOSIS — E11620 Type 2 diabetes mellitus with diabetic dermatitis: Secondary | ICD-10-CM | POA: Diagnosis not present

## 2018-10-12 DIAGNOSIS — D631 Anemia in chronic kidney disease: Secondary | ICD-10-CM | POA: Diagnosis not present

## 2018-10-12 DIAGNOSIS — E1165 Type 2 diabetes mellitus with hyperglycemia: Secondary | ICD-10-CM | POA: Diagnosis not present

## 2018-10-13 ENCOUNTER — Telehealth: Payer: Self-pay | Admitting: Internal Medicine

## 2018-10-13 NOTE — Telephone Encounter (Signed)
Due to the recent COVID-19 pandemic, we are transitioning in-person office visits to tele-medicine visits in an effort to decrease unnecessary exposure to our patients, their families, and staff. These visits are billed to your insurance just like a normal visit is. We also encourage you to sign up for MyChart if you have not already done so. You will need a smartphone if possible. For patients that do not have this, we can still complete the visit using a regular telephone but do prefer a smartphone to enable video when possible. You may have a family member that lives with you that can help. If possible, we also ask that you have a blood pressure cuff and scale at home to measure your blood pressure, heart rate and weight prior to your scheduled appointment. Patients with clinical needs that need an in-person evaluation and testing will still be able to come to the office if absolutely necessary. If you have any questions, feel free to call our office.    YOUR PROVIDER WILL BE USING DOXIMITY or [email or web address] - The staff will give you instructions on receiving your link to join the meeting the day of your visit.    THE DAY OF YOUR APPOINTMENT  Approximately 15 minutes prior to your scheduled appointment, you will receive a telephone call from one of [facility] team - your caller ID may say "Unknown caller."  Our staff will confirm medications, vital signs for the day and any symptoms you may be experiencing. Please have this information available prior to the time of visit start. It may also be helpful for you to have a pad of paper and pen handy for any instructions given during your visit. They will also walk you through joining the smartphone meeting if this is a video visit.    CONSENT FOR TELE-HEALTH VISIT - PLEASE REVIEW  I hereby voluntarily request, consent and authorize [facility] and its employed or contracted physicians, physician assistants, nurse practitioners or other licensed health care  professionals (the Practitioner), to provide me with telemedicine health care services (the "Services") as deemed necessary by the treating Practitioner. I acknowledge and consent to receive the Services by the Practitioner via telemedicine. I understand that the telemedicine visit will involve communicating with the Practitioner through live audiovisual communication technology and the disclosure of certain medical information by electronic transmission. I acknowledge that I have been given the opportunity to request an in-person assessment or other available alternative prior to the telemedicine visit and am voluntarily participating in the telemedicine visit.   I understand that I have the right to withhold or withdraw my consent to the use of telemedicine in the course of my care at any time, without affecting my right to future care or treatment, and that the Practitioner or I may terminate the telemedicine visit at any time. I understand that I have the right to inspect all information obtained and/or recorded in the course of the telemedicine visit and may receive copies of available information for a reasonable fee.  I understand that some of the potential risks of receiving the Services via telemedicine include:   Delay or interruption in medical evaluation due to technological equipment failure or disruption;  Information transmitted may not be sufficient (e.g. poor resolution of images) to allow for appropriate medical decision making by the Practitioner; and/or  In rare instances, security protocols could fail, causing a breach of personal health information.   Furthermore, I acknowledge that it is my responsibility to provide information about my   medical history, conditions and care that is complete and accurate to the best of my ability. I acknowledge that Practitioner's advice, recommendations, and/or decision may be based on factors not within their control, such as incomplete or  inaccurate data provided by me or distortions of diagnostic images or specimens that may result from electronic transmissions. I understand that the practice of medicine is not an exact science and that Practitioner makes no warranties or guarantees regarding treatment outcomes. I acknowledge that I will receive a copy of this consent concurrently upon execution via email to the email address I last provided but may also request a printed copy by calling the office of [facility].     I understand that my insurance will be billed for this visit.   I have read or had this consent read to me.  I understand the contents of this consent, which adequately explains the benefits and risks of the Services being provided via telemedicine.  I have been provided ample opportunity to ask questions regarding this consent and the Services and have had my questions answered to my satisfaction.  I give my informed consent for the services to be provided through the use of telemedicine in my medical care   By participating in this telemedicine visit I agree to the above.  

## 2018-10-14 ENCOUNTER — Telehealth (INDEPENDENT_AMBULATORY_CARE_PROVIDER_SITE_OTHER): Payer: Medicare Other | Admitting: Internal Medicine

## 2018-10-14 ENCOUNTER — Encounter: Payer: Self-pay | Admitting: Internal Medicine

## 2018-10-14 ENCOUNTER — Ambulatory Visit: Payer: Medicare Other | Admitting: Internal Medicine

## 2018-10-14 VITALS — BP 140/68 | HR 93 | Ht 59.0 in | Wt 176.0 lb

## 2018-10-14 DIAGNOSIS — I4821 Permanent atrial fibrillation: Secondary | ICD-10-CM

## 2018-10-14 DIAGNOSIS — I10 Essential (primary) hypertension: Secondary | ICD-10-CM | POA: Diagnosis not present

## 2018-10-14 DIAGNOSIS — I5022 Chronic systolic (congestive) heart failure: Secondary | ICD-10-CM

## 2018-10-14 DIAGNOSIS — I428 Other cardiomyopathies: Secondary | ICD-10-CM

## 2018-10-14 MED ORDER — LOSARTAN POTASSIUM 25 MG PO TABS: 25.0000 mg | ORAL_TABLET | Freq: Every day | ORAL | 1 refills | Status: DC

## 2018-10-14 NOTE — Patient Instructions (Signed)
Medication Instructions:   INCREASE Losartan to 25 mg (1 tablet) daily  If you need a refill on your cardiac medications before your next appointment, please call your pharmacy.   Lab work:  NONE ordered at this time of appointment   If you have labs (blood work) drawn today and your tests are completely normal, you will receive your results only by: Marland Kitchen MyChart Message (if you have MyChart) OR . A paper copy in the mail If you have any lab test that is abnormal or we need to change your treatment, we will call you to review the results.  Testing/Procedures:  NONE ordered at this time of appointment  Follow-Up: At [facility], you and your health needs are our priority.  As part of our continuing mission to provide you with exceptional heart care, we have created designated Provider Care Teams.  These Care Teams include your primary Cardiologist (physician) and Advanced Practice Providers (APPs -  Physician Assistants and Nurse Practitioners) who all work together to provide you with the care you need, when you need it. You will need a follow up appointment in 6 months.  Please call our office 2 months in advance to schedule this appointment.  You may see Pixie Casino, MD or one of the following Advanced Practice Providers on your designated Care Team: [place], [place] . Fabian Sharp, PA-C  Any Other Special Instructions Will Be Listed Below (If Applicable).

## 2018-10-14 NOTE — Progress Notes (Signed)
Virtual Visit via Telephone Note   This visit type was conducted due to national recommendations for restrictions regarding the COVID-19 Pandemic (e.g. social distancing) in an effort to limit this patient's exposure and mitigate transmission in our community.  Due to her co-morbid illnesses, this patient is at least at moderate risk for complications without adequate follow up.  This format is felt to be most appropriate for this patient at this time.  The patient did not have access to video technology/had technical difficulties with video requiring transitioning to audio format only (telephone).  All issues noted in this document were discussed and addressed.  No physical exam could be performed with this format.  Please refer to the patient's chart for her  consent to telehealth for [facility].   Evaluation Performed:  Telephone follow-up  Date:  10/14/2018   ID:  Denise Freeman, DOB [birth year], MRN [id number]  Patient Location:  [place] [address]  Provider location:   [address] [place], [place] [address]  PCP:  Celene Squibb, MD  Cardiologist:  Pixie Casino, MD Electrophysiologist:  None   Chief Complaint:  Palpitations, sharp chest pain  History of Present Illness:    Denise Freeman is a 68 y.o. female who presents via audio/video conferencing for a telehealth visit today.  Refill is seen today via telephone visit for symptoms of left-sided chest pain and palpitations.  Recently she has been struggling with recurrent skin infections.  She was placed on steroids and clindamycin.  She does report some improvement in her skin blisters, but has had some palpitations and tachycardia.  She has permanent A. fib and her rate appears to be well controlled today.  With regards her chest pain, this is left-sided, sharp and intermittent, worse somewhat with movement but not necessarily exertion.  She has no known coronary disease and has had multiple  cardiac catheterizations over the past 20 years demonstrated no significant obstructive coronary disease.  She has a nonischemic cardiomyopathy with EF 30 to 35% and is on high-dose diuretics, Lasix 80 mg 3 times daily and metolazone 2.5 mg daily.  She does appear to be euvolemic on this dose in fact her weight is actually 4 pounds lighter than it was in February.  10/14/2018  Denise Freeman is seen today for follow-up.  She reports that overall she is feeling better.  Her chest pain has improved.  She is now off of steroids.  Her heart rate is improved as well.  She was on treatment for gout however she said she had some difficulty with capsules rather than tablets for colchicine.  She remains on an antibiotic.  Her weight is been stable.  Her blood pressure still remains elevated despite restarting her losartan 12.5 mg daily.  The patient does not have symptoms concerning for COVID-19 infection (fever, chills, cough, or new SHORTNESS OF BREATH).    Prior CV studies:   The following studies were reviewed today:  Labs  PMHx:  Past Medical History:  Diagnosis Date   Allergy    Anemia    Anxiety    Asthma    Atrial fibrillation ([place])    Bipolar 1 disorder ([place])    Blind    "partially in both eyes" (03/14/2016)   Cholelithiasis    a. 09/2016 s/p Lap Chole.   Chronic bronchitis (H[facility])    Chronic combined systolic and diastolic congestive heart failure ([place])    a. 09/2016 Echo: EF 30-35%.  Colon polyps    COPD (chronic obstructive pulmonary disease) (H[facility])    Depression    Family history of adverse reaction to anesthesia    Uncle was positive for malignant hyperthermia; patient had testing done and was negative.   Fibromyalgia    GERD (gastroesophageal reflux disease)    Gout    High cholesterol    History of blood transfusion 06/2015   "bleeding from my rectum"   History of hiatal hernia    HOH (hard of hearing)    Hx of colonic polyps 03/21/2016   3 small  adenomas no recall - co-morbidities   Neuropathy    Disc Back    NICM (nonischemic cardiomyopathy) ([place])    a. Previously worked up in [place], [place]-->low EF with subsequent recovery.  Multiple caths (last ~ 2014 per pt report)--reportedly nl cors;  b. 09/2016 Echo: EF 30-35%, antsept/apical HK, mild MR, mildly dil LA, mod dil RA;  c. 09/2016 Lexi MV: EF 26%, glob HK, sept DK, med size, mod intensity fixed septal defect - BBB/PVC related artifact, no ischemia.   On home oxygen therapy    "3L; 24/7" (03/14/2016)   OSA treated with BiPAP    uses biPAP, 10 (03/14/2016)   Osteoarthritis    Oxygen deficiency    Pneumonia    Type II diabetes mellitus ([place])     Past Surgical History:  Procedure Laterality Date   APPENDECTOMY     "they busted"   bladder stimulator     pt states, "it cannot be turned off; it's in my right hip; dead battery so it's not working anymore". (03/14/2016)   BLADDER SUSPENSION     2003, 2006 and 2010   CATARACT EXTRACTION W/PHACO Right 11/29/2014   Procedure: CATARACT EXTRACTION PHACO AND INTRAOCULAR LENS PLACEMENT (IOC);  Surgeon: Rutherford Guys, MD;  Location: [facility];  Service: Ophthalmology;  Laterality: Right;  CDE:3.81   CATARACT EXTRACTION W/PHACO Left 12/13/2014   Procedure: CATARACT EXTRACTION PHACO AND INTRAOCULAR LENS PLACEMENT (IOC);  Surgeon: Rutherford Guys, MD;  Location: [facility];  Service: Ophthalmology;  Laterality: Left;  CDE:6.59   CERVICAL DISC SURGERY N/A 2009   4, 6, and 7 cervical disc replaced   CHOLECYSTECTOMY N/A 09/13/2016   Procedure: LAPAROSCOPIC CHOLECYSTECTOMY;  Surgeon: Rolm Bookbinder, MD;  Location: [facility];  Service: General;  Laterality: N/A;   COLONOSCOPY WITH PROPOFOL N/A 03/15/2016   Procedure: COLONOSCOPY WITH PROPOFOL;  Surgeon: Gatha Mayer, MD;  Location: [facility];  Service: Endoscopy;  Laterality: N/A;   ESOPHAGOGASTRODUODENOSCOPY (EGD) WITH PROPOFOL N/A 03/15/2016   Procedure: ESOPHAGOGASTRODUODENOSCOPY (EGD)  WITH PROPOFOL;  Surgeon: Gatha Mayer, MD;  Location: [facility];  Service: Endoscopy;  Laterality: N/A;   HEEL SPUR SURGERY Bilateral    HERNIA REPAIR     I&D EXTREMITY Right 06/13/2015   Procedure: MINOR IRRIGATION AND DEBRIDEMENT EXTREMITY REMOVAL OF NAIL;  Surgeon: Daryll Brod, MD;  Location: [facility];  Service: Orthopedics;  Laterality: Right;   TUBAL LIGATION     UMBILICAL HERNIA REPAIR     w/mesh    FAMHx:  Family History  Problem Relation Age of Onset   Heart disease Mother    COPD Mother    Diabetes Mother    Breast cancer Mother    Heart disease Father    Hyperlipidemia Father    COPD Sister    Heart disease Sister    Diabetes Sister    Heart disease Maternal Grandmother    Cancer Maternal Grandmother  stomach   Cancer Maternal Grandfather        lung    Bipolar disorder Brother     SOCHx:   reports that she quit smoking about 29 years ago. Her smoking use included cigarettes. She has a 28.00 pack-year smoking history. She has never used smokeless tobacco. She reports that she does not drink alcohol or use drugs.  ALLERGIES:  Allergies  Allergen Reactions   Xarelto [Rivaroxaban] Other (See Comments)    Internal bleeding   Ancef [Cefazolin] Nausea And Vomiting   Levaquin [Levofloxacin In D5w] Other (See Comments)    "afib"   Lyrica [Pregabalin] Hives   Tamiflu [Oseltamivir Phosphate] Other (See Comments)    "water blisters"   Zoloft [Sertraline Hcl] Other (See Comments)    Jaw problems, jittery   Augmentin [Amoxicillin-Pot Clavulanate] Itching   Ciprofloxacin Itching and Nausea And Vomiting   Haldol [Haloperidol] Other (See Comments)    Restless leg   Nsaids Diarrhea   Penicillins Itching, Nausea And Vomiting and Rash    Has patient had a PCN reaction causing immediate rash, facial/tongue/throat swelling, SOB or lightheadedness with hypotension: Yes Has patient had a PCN reaction causing severe rash  involving mucus membranes or skin necrosis: No Has patient had a PCN reaction that required hospitalization No Has patient had a PCN reaction occurring within the last 10 years: Yes If all of the above answers are "NO", then may proceed with Cephalosporin use.   Topamax [Topiramate] Nausea Only    MEDS:  Current Meds  Medication Sig   albuterol (PROVENTIL) (2.5 MG/3ML) 0.083% nebulizer solution USE (1) IN NEBULIZER EVERY 6 HOURS AS NEEDED FOR SHORTNESS OF BREATH.   allopurinol (ZYLOPRIM) 100 MG tablet TAKE 1 TABLET BY MOUTH ONCE DAILY.   cetirizine (ZYRTEC) 10 MG tablet Take 1 tablet (10 mg total) by mouth at bedtime.   cholecalciferol (VITAMIN D) 400 units TABS tablet Take 1 tablet (400 Units total) by mouth 2 (two) times daily.   colchicine 0.6 MG tablet Take 1 tablet (0.6 mg total) by mouth daily. (Patient taking differently: Take 0.6 mg by mouth as needed. )   escitalopram (LEXAPRO) 10 MG tablet Take 1 tablet (10 mg total) by mouth at bedtime.   ferrous sulfate 324 (65 Fe) MG TBEC Take 1 tablet by mouth daily.   furosemide (LASIX) 40 MG tablet TAKE (2) TABLETS BY MOUTH THREE TIMES DAILY.   Insulin Glargine (BASAGLAR KWIKPEN) 100 UNIT/ML SOPN Inject 0.6 mLs (60 Units total) into the skin at bedtime.   insulin lispro (HUMALOG KWIKPEN) 100 UNIT/ML KiwkPen Inject 0.1-0.16 mLs (10-16 Units total) into the skin 3 (three) times daily before meals.   Insulin Pen Needle 32G X 4 MM MISC Use to inject insulin 5 times daily   ipratropium (ATROVENT) 0.02 % nebulizer solution USE (1) IN NEBULIZER EVERY 6 HOURS AS NEEDED FOR SHORTNESS OF BREATH.   losartan (COZAAR) 25 MG tablet Take 1 tablet (25 mg total) by mouth daily.   metolazone (ZAROXOLYN) 2.5 MG tablet TAKE 1 TABLET BY MOUTH ONCE DAILY AS NEEDED FOR EDEMA. * TAKE 30 MINUTES BEFORE LASIX* (Patient taking differently: daily. TAKE 1 TABLET BY MOUTH ONCE DAILY AS NEEDED FOR EDEMA. * TAKE 30 MINUTES BEFORE LASIX*)   metoprolol  succinate (TOPROL-XL) 25 MG 24 hr tablet Take 1 tablet (25 mg total) by mouth 2 (two) times daily.   mirtazapine (REMERON) 15 MG tablet Take 1 tablet (15 mg total) by mouth at bedtime.   Misc Natural Products (  TART CHERRY ADVANCED PO) Take by mouth daily.   modafinil (PROVIGIL) 200 MG tablet Take 1 tablet (200 mg total) by mouth daily.   mometasone (NASONEX) 50 MCG/ACT nasal spray Place 1 spray into the nose daily.   montelukast (SINGULAIR) 10 MG tablet TAKE ONE TABLET BY MOUTH AT BEDTIME.   mupirocin cream (BACTROBAN) 2 % Apply 1 application topically 2 (two) times daily.   nystatin (NYSTATIN) powder Apply topically 2 (two) times daily. Apply under breasts   oxyCODONE-acetaminophen (PERCOCET/ROXICET) 5-325 MG tablet Take 1 tablet by mouth 2 (two) times daily as needed for severe pain.   OXYGEN Inhale 3 L into the lungs continuous. 3 liters 24/7   pantoprazole (PROTONIX) 20 MG tablet TAKE (1) TABLET BY MOUTH TWICE A DAY BEFORE MEALS. (BREAKFAST AND SUPPER)   potassium chloride (K-DUR) 10 MEQ tablet TAKE 1 TABLET BY MOUTH ONCE DAILY.   predniSONE (DELTASONE) 5 MG tablet Take 1 tablet (5 mg total) by mouth daily with breakfast.   PRESCRIPTION MEDICATION Inhale into the lungs See admin instructions. Use BIPAP every time laying down   primidone (MYSOLINE) 50 MG tablet TAKE ONE TABLET BY MOUTH AT BEDTIME.   Probiotic Product (PROBIOTIC PO) Take 1 tablet by mouth at bedtime.   triamcinolone lotion (KENALOG) 0.1 % Apply 1 application topically 3 (three) times daily. For up to 14 days and as needed   TRUE METRIX BLOOD GLUCOSE TEST test strip USE TO TEST BLOOD SUGAR AS DIRECTED.   Vitamin A 2400 MCG (8000 UT) CAPS Take 8,000 Units by mouth every morning.    warfarin (COUMADIN) 4 MG tablet TAKE 1 TABLET BY MOUTH ONCE DAILY EXCEPT ON THURSDAY TAKE 1/2 TABLET (Patient taking differently: TAKE 1 TABLET BY MOUTH ONCE DAILY EXCEPT ON TUESDAY TAKE 1/2 TABLET)   [DISCONTINUED] losartan  (COZAAR) 25 MG tablet Take 0.5 tablets (12.5 mg total) by mouth daily.     ROS: Pertinent items noted in HPI and remainder of comprehensive ROS otherwise negative.  Labs/Other Tests and Data Reviewed:    Recent Labs: 11/13/2017: ALT 12 12/03/2017: BUN 30; Creatinine, Ser 1.45; NT-Pro BNP 6,297; Potassium 3.7; Sodium 139   Recent Lipid Panel Lab Results  Component Value Date/Time   CHOL 163 07/29/2017 11:15 AM   TRIG 183 (H) 07/29/2017 11:15 AM   HDL 40 07/29/2017 11:15 AM   CHOLHDL 4.1 07/29/2017 11:15 AM   CHOLHDL 6.2 09/10/2016 11:47 AM   LDLCALC 86 07/29/2017 11:15 AM   LDLDIRECT 153.0 04/19/2016 09:32 AM    Wt Readings from Last 3 Encounters:  10/14/18 176 lb (79.8 kg)  09/16/18 174 lb (78.9 kg)  07/07/18 178 lb (80.7 kg)     Exam:    Vital Signs:  BP 140/68    Pulse 93    Ht 4\' 11"  (1.499 m)    Wt 176 lb (79.8 kg)    BMI 35.55 kg/m    exam not performed due to telephone visit  ASSESSMENT & PLAN:    1. Permanent atrial fibrillation - not on anticoagulation due to recent GI bleeding and vision loss attributed to stroke 2. Acute on Chronic combined congestive heart failure - EF 30-35% 3. History of stress-induced cardiomyopathy 4. No significant obstructive coronary disease after multiple catheterizations in 1996, 99, 2007 and 2014. 5. Fibromyalgia 6. Bipolar 1 disorder 7. Dyslipidemia 8. Hypertension 9. Severe COPD on home oxygen 10. Obstructive sleep apnea on CPAP 11. Morbid obesity - with weight loss recently 12. DNR  Danny does not report any  worsening chest pain.  At this point, her blood pressure remains elevated despite starting low-dose losartan.  We will increase it back to 25 mg daily.  Otherwise no changes to her medicines today.  Follow-up with me in 6 months.  COVID-19 Education: The signs and symptoms of COVID-19 were discussed with the patient and how to seek care for testing (follow up with PCP or arrange E-visit).  The importance of social  distancing was discussed today.  Patient Risk:   After full review of this patients clinical status, I feel that they are at least moderate risk at this time.  Time:   Today, I have spent 25 minutes with the patient with telehealth technology discussing sharp chest pain, palpitations, skin infection, antibiotics, steroid use.     Medication Adjustments/Labs and Tests Ordered: Current medicines are reviewed at length with the patient today.  Concerns regarding medicines are outlined above.   Tests Ordered: No orders of the defined types were placed in this encounter.   Medication Changes: Meds ordered this encounter  Medications   losartan (COZAAR) 25 MG tablet    Sig: Take 1 tablet (25 mg total) by mouth daily.    Dispense:  90 tablet    Refill:  1    Disposition:  in 6 month(s)  Pixie Casino, MD, [facility], [place] Director of the Advanced Lipid Disorders &  Cardiovascular Risk Reduction Clinic Diplomate of the American Board of Clinical Lipidology Attending Cardiologist  Direct Dial: [phone]   Fax: [phone]  Website:  [email or web address]  Pixie Casino, MD  10/14/2018 10:42 AM

## 2018-10-16 DIAGNOSIS — E1165 Type 2 diabetes mellitus with hyperglycemia: Secondary | ICD-10-CM | POA: Diagnosis not present

## 2018-10-16 DIAGNOSIS — N183 Chronic kidney disease, stage 3 (moderate): Secondary | ICD-10-CM | POA: Diagnosis not present

## 2018-10-16 DIAGNOSIS — J302 Other seasonal allergic rhinitis: Secondary | ICD-10-CM | POA: Diagnosis not present

## 2018-10-16 DIAGNOSIS — M797 Fibromyalgia: Secondary | ICD-10-CM | POA: Diagnosis not present

## 2018-10-16 DIAGNOSIS — Z79891 Long term (current) use of opiate analgesic: Secondary | ICD-10-CM | POA: Diagnosis not present

## 2018-10-16 DIAGNOSIS — F319 Bipolar disorder, unspecified: Secondary | ICD-10-CM | POA: Diagnosis not present

## 2018-10-16 DIAGNOSIS — M1000 Idiopathic gout, unspecified site: Secondary | ICD-10-CM | POA: Diagnosis not present

## 2018-10-16 DIAGNOSIS — G894 Chronic pain syndrome: Secondary | ICD-10-CM | POA: Diagnosis not present

## 2018-10-16 DIAGNOSIS — M545 Low back pain: Secondary | ICD-10-CM | POA: Diagnosis not present

## 2018-10-16 DIAGNOSIS — E1122 Type 2 diabetes mellitus with diabetic chronic kidney disease: Secondary | ICD-10-CM | POA: Diagnosis not present

## 2018-10-16 DIAGNOSIS — E782 Mixed hyperlipidemia: Secondary | ICD-10-CM | POA: Diagnosis not present

## 2018-10-16 DIAGNOSIS — F411 Generalized anxiety disorder: Secondary | ICD-10-CM | POA: Diagnosis not present

## 2018-10-29 ENCOUNTER — Other Ambulatory Visit: Payer: Self-pay | Admitting: Internal Medicine

## 2018-11-03 DIAGNOSIS — R197 Diarrhea, unspecified: Secondary | ICD-10-CM | POA: Diagnosis not present

## 2018-11-03 DIAGNOSIS — R110 Nausea: Secondary | ICD-10-CM | POA: Diagnosis not present

## 2018-11-04 DIAGNOSIS — Z6836 Body mass index (BMI) 36.0-36.9, adult: Secondary | ICD-10-CM | POA: Diagnosis not present

## 2018-11-04 DIAGNOSIS — K219 Gastro-esophageal reflux disease without esophagitis: Secondary | ICD-10-CM | POA: Diagnosis not present

## 2018-11-04 DIAGNOSIS — R251 Tremor, unspecified: Secondary | ICD-10-CM | POA: Diagnosis not present

## 2018-11-04 DIAGNOSIS — K5900 Constipation, unspecified: Secondary | ICD-10-CM | POA: Diagnosis not present

## 2018-11-04 DIAGNOSIS — J449 Chronic obstructive pulmonary disease, unspecified: Secondary | ICD-10-CM | POA: Diagnosis not present

## 2018-11-04 DIAGNOSIS — R21 Rash and other nonspecific skin eruption: Secondary | ICD-10-CM | POA: Diagnosis not present

## 2018-11-04 DIAGNOSIS — E559 Vitamin D deficiency, unspecified: Secondary | ICD-10-CM | POA: Diagnosis not present

## 2018-11-04 DIAGNOSIS — M109 Gout, unspecified: Secondary | ICD-10-CM | POA: Diagnosis not present

## 2018-11-04 DIAGNOSIS — E782 Mixed hyperlipidemia: Secondary | ICD-10-CM | POA: Diagnosis not present

## 2018-11-04 DIAGNOSIS — J9610 Chronic respiratory failure, unspecified whether with hypoxia or hypercapnia: Secondary | ICD-10-CM | POA: Diagnosis not present

## 2018-11-04 DIAGNOSIS — G8929 Other chronic pain: Secondary | ICD-10-CM | POA: Diagnosis not present

## 2018-11-04 DIAGNOSIS — Z6835 Body mass index (BMI) 35.0-35.9, adult: Secondary | ICD-10-CM | POA: Diagnosis not present

## 2018-11-05 ENCOUNTER — Ambulatory Visit (INDEPENDENT_AMBULATORY_CARE_PROVIDER_SITE_OTHER): Payer: Medicare Other | Admitting: *Deleted

## 2018-11-05 DIAGNOSIS — Z5181 Encounter for therapeutic drug level monitoring: Secondary | ICD-10-CM

## 2018-11-05 DIAGNOSIS — I480 Paroxysmal atrial fibrillation: Secondary | ICD-10-CM

## 2018-11-05 LAB — POCT INR: INR: 1.7 — AB (ref 2.0–3.0)

## 2018-11-05 NOTE — Patient Instructions (Signed)
Take coumadin 1 1/2 tablets tonight then increase dose to 1 tablet daily Recheck in 4 weeks

## 2018-11-10 ENCOUNTER — Other Ambulatory Visit: Payer: Self-pay

## 2018-11-10 NOTE — Patient Outreach (Signed)
[facility]) Care Management  11/10/2018  Denise Freeman [facility] 05-07-1951 [id number]    1st outreach attempt to the patient for assessment.  No answer.  HIPAA compliant voicemail left with contact information.  Plan: RN Health Coach will send letter. [place] will make outreach attempt to the patient within thirty business days.  [name] RN, BSN, [place] Direct Dial:  [phone]  Fax: [phone]

## 2018-12-10 ENCOUNTER — Other Ambulatory Visit: Payer: Self-pay

## 2018-12-10 NOTE — Patient Outreach (Signed)
[place] [facility]) Care Management  12/10/2018   Denise Freeman [facility] July 27, 1950 [id number]  Subjective: Successful outreach to the patient. Two patient identifier given.  The patient states that she is doing fair.  She said she is having pain 8/10 from her fibromyalgia but her pain medication helps to "knock the edge off".  She has not had any falls.  Her fasting blood sugar this morning was 170.  Her last A!C in May was 11.6 an increase from November 2019 of 10.  She states that she does not understand why her a1c is elevated. Encourage her to write down her readings and when elevated to write down the foods she has eaten and at our next call we can discuss her food choices.  She verbalized understanding. She said she only drinks water and 2% milk.  She states she does some chores around the home to keep active and she takes her medication as prescribe.  She is unsure when her next appointment is for her diabetes but she is planning on calling to find out.  Current Medications:  Current Outpatient Medications  Medication Sig Dispense Refill  . albuterol (PROVENTIL) (2.5 MG/3ML) 0.083% nebulizer solution USE (1) IN NEBULIZER EVERY 6 HOURS AS NEEDED FOR SHORTNESS OF BREATH. 75 mL 0  . allopurinol (ZYLOPRIM) 100 MG tablet TAKE 1 TABLET BY MOUTH ONCE DAILY. 30 tablet 0  . cetirizine (ZYRTEC) 10 MG tablet Take 1 tablet (10 mg total) by mouth at bedtime. 30 tablet 11  . cholecalciferol (VITAMIN D) 400 units TABS tablet Take 1 tablet (400 Units total) by mouth 2 (two) times daily. 180 each 3  . colchicine 0.6 MG tablet Take 1 tablet (0.6 mg total) by mouth daily. (Patient taking differently: Take 0.6 mg by mouth as needed. ) 30 tablet 1  . escitalopram (LEXAPRO) 10 MG tablet Take 1 tablet (10 mg total) by mouth at bedtime. 90 tablet 0  . ferrous sulfate 324 (65 Fe) MG TBEC Take 1 tablet by mouth daily.    . furosemide (LASIX) 40 MG tablet TAKE (2) TABLETS BY MOUTH THREE TIMES DAILY. 168 tablet 10   . Insulin Glargine (BASAGLAR KWIKPEN) 100 UNIT/ML SOPN Inject 0.6 mLs (60 Units total) into the skin at bedtime. 15 mL 2  . insulin lispro (HUMALOG KWIKPEN) 100 UNIT/ML KiwkPen Inject 0.1-0.16 mLs (10-16 Units total) into the skin 3 (three) times daily before meals. 15 mL 2  . Insulin Pen Needle 32G X 4 MM MISC Use to inject insulin 5 times daily 500 each 5  . ipratropium (ATROVENT) 0.02 % nebulizer solution USE (1) IN NEBULIZER EVERY 6 HOURS AS NEEDED FOR SHORTNESS OF BREATH. 62.5 mL 0  . losartan (COZAAR) 25 MG tablet Take 1 tablet (25 mg total) by mouth daily. 90 tablet 1  . metolazone (ZAROXOLYN) 2.5 MG tablet TAKE 1 TABLET BY MOUTH ONCE DAILY AS NEEDED FOR EDEMA. * TAKE 30 MINUTES BEFORE LASIX* (Patient taking differently: daily. TAKE 1 TABLET BY MOUTH ONCE DAILY AS NEEDED FOR EDEMA. * TAKE 30 MINUTES BEFORE LASIX*) 90 tablet 2  . metoprolol succinate (TOPROL-XL) 25 MG 24 hr tablet Take 1 tablet (25 mg total) by mouth 2 (two) times daily. 180 tablet 1  . mirtazapine (REMERON) 15 MG tablet Take 1 tablet (15 mg total) by mouth at bedtime. 90 tablet 0  . Misc Natural Products (TART CHERRY ADVANCED PO) Take by mouth daily.    . modafinil (PROVIGIL) 200 MG tablet Take 1 tablet (200 mg total)  by mouth daily. 30 tablet 5  . mometasone (NASONEX) 50 MCG/ACT nasal spray Place 1 spray into the nose daily. 17 g 11  . montelukast (SINGULAIR) 10 MG tablet TAKE ONE TABLET BY MOUTH AT BEDTIME. 30 tablet 6  . mupirocin cream (BACTROBAN) 2 % Apply 1 application topically 2 (two) times daily. 15 g 0  . nystatin (NYSTATIN) powder Apply topically 2 (two) times daily. Apply under breasts 15 g 3  . oxyCODONE-acetaminophen (PERCOCET/ROXICET) 5-325 MG tablet Take 1 tablet by mouth 2 (two) times daily as needed for severe pain. 60 tablet 0  . OXYGEN Inhale 3 L into the lungs continuous. 3 liters 24/7    . pantoprazole (PROTONIX) 20 MG tablet TAKE (1) TABLET BY MOUTH TWICE A DAY BEFORE MEALS. (BREAKFAST AND SUPPER) 56  tablet 0  . potassium chloride (K-DUR) 10 MEQ tablet TAKE 1 TABLET BY MOUTH ONCE DAILY. 28 tablet 10  . predniSONE (DELTASONE) 5 MG tablet Take 1 tablet (5 mg total) by mouth daily with breakfast. 30 tablet 2  . PRESCRIPTION MEDICATION Inhale into the lungs See admin instructions. Use BIPAP every time laying down    . primidone (MYSOLINE) 50 MG tablet TAKE ONE TABLET BY MOUTH AT BEDTIME. 30 tablet 5  . Probiotic Product (PROBIOTIC PO) Take 1 tablet by mouth at bedtime.    . triamcinolone lotion (KENALOG) 0.1 % Apply 1 application topically 3 (three) times daily. For up to 14 days and as needed 60 mL 1  . TRUE METRIX BLOOD GLUCOSE TEST test strip USE TO TEST BLOOD SUGAR AS DIRECTED. 50 each 2  . Vitamin A 2400 MCG (8000 UT) CAPS Take 8,000 Units by mouth every morning.     . warfarin (COUMADIN) 4 MG tablet TAKE 1 TABLET BY MOUTH ONCE DAILY EXCEPT ON THURSDAY TAKE 1/2 TABLET (Patient taking differently: TAKE 1 TABLET BY MOUTH ONCE DAILY EXCEPT ON TUESDAY TAKE 1/2 TABLET) 30 tablet 5   No current facility-administered medications for this visit.     Functional Status:  No flowsheet data found.  Fall/Depression Screening: Fall Risk  12/10/2018 08/10/2018 04/29/2018  Falls in the past year? 0 0 1  Number falls in past yr: - - 0  Injury with Fall? - - 0  Comment - - -  Risk Factor Category  - - -  Comment - - -  Risk for fall due to : - - -  Risk for fall due to: Comment - - -  Follow up - - -   PHQ 2/9 Scores 11/28/2017 11/27/2017 10/03/2017 09/15/2017 09/05/2017 08/07/2017 07/29/2017  PHQ - 2 Score 0 5 6 0 0 0 0  PHQ- 9 Score - - 23 - - - -    Assessment: Patient will continue to benefit from health coach outreach for disease management and support. [facility] CM Care Plan Problem One     Most Recent Value  [facility] Long Term Goal   IN 30 days the patient will lower her a1c  11.6 by 1 to 2 points  [facility] Long Term Goal Start Date  12/10/18  Interventions for Problem One Long Term Goal  Discussed FBS writing  down and tracking highs and also the thigs she has eaten, Discussed food and fluid choices, Discussed medication adherence     Plan: [facility] will contact patient in the month of August and patient agrees to next outreach. RN Health Coach will informational handout called "All about the Carbs.  [name] RN, BSN, [facility] RN  [place] Direct Dial:  [phone]  Fax: [phone]

## 2018-12-18 ENCOUNTER — Other Ambulatory Visit: Payer: Self-pay | Admitting: "Endocrinology

## 2018-12-21 ENCOUNTER — Ambulatory Visit (INDEPENDENT_AMBULATORY_CARE_PROVIDER_SITE_OTHER): Payer: Medicare Other | Admitting: *Deleted

## 2018-12-21 ENCOUNTER — Other Ambulatory Visit: Payer: Self-pay

## 2018-12-21 DIAGNOSIS — I480 Paroxysmal atrial fibrillation: Secondary | ICD-10-CM

## 2018-12-21 DIAGNOSIS — Z5181 Encounter for therapeutic drug level monitoring: Secondary | ICD-10-CM

## 2018-12-21 LAB — POCT INR: INR: 5.3 — AB (ref 2.0–3.0)

## 2018-12-21 NOTE — Patient Instructions (Signed)
Hold coumadin tonight and tomorrow night then decrease dose to 1 tablet daily except 1/2 tablet on Tuesdays Recheck in 2 weeks Has not been eating well per pt.

## 2018-12-24 ENCOUNTER — Other Ambulatory Visit: Payer: Self-pay | Admitting: "Endocrinology

## 2019-01-08 ENCOUNTER — Other Ambulatory Visit: Payer: Self-pay | Admitting: Internal Medicine

## 2019-01-15 ENCOUNTER — Other Ambulatory Visit: Payer: Self-pay

## 2019-01-15 NOTE — Patient Outreach (Signed)
[place] [facility]) Care Management  01/15/2019  Gwenette Wellons [facility] December 28, 1950 [id number]    1st outreach to the patient for assessment.  No answer.  HIPAA compliant voicemail left with contact information.   Plan: RN Health Coach will send letter. [place] will make outreach attempt to the patient within thirty business days.  [name] RN, BSN, [place] Direct Dial:  [phone]  Fax: [phone]

## 2019-01-20 ENCOUNTER — Telehealth: Payer: Self-pay | Admitting: *Deleted

## 2019-01-20 NOTE — Telephone Encounter (Signed)

## 2019-01-22 ENCOUNTER — Other Ambulatory Visit: Payer: Self-pay

## 2019-01-22 ENCOUNTER — Encounter (HOSPITAL_COMMUNITY): Payer: Self-pay | Admitting: Emergency Medicine

## 2019-01-22 ENCOUNTER — Other Ambulatory Visit (HOSPITAL_COMMUNITY): Payer: Self-pay | Admitting: Adult Health Nurse Practitioner

## 2019-01-22 ENCOUNTER — Inpatient Hospital Stay (HOSPITAL_COMMUNITY)
Admission: EM | Admit: 2019-01-22 | Discharge: 2019-01-25 | DRG: 291 | Disposition: A | Discharge status: Home Health | Payer: Medicare Other | Attending: Internal Medicine | Admitting: Internal Medicine

## 2019-01-22 ENCOUNTER — Emergency Department (HOSPITAL_COMMUNITY): Payer: Medicare Other

## 2019-01-22 ENCOUNTER — Ambulatory Visit (HOSPITAL_COMMUNITY)
Admission: RE | Admit: 2019-01-22 | Discharge: 2019-01-22 | Disposition: A | Discharge status: Home / Self Care | Payer: Medicare Other | Source: Ambulatory Visit | Attending: Adult Health Nurse Practitioner | Admitting: Adult Health Nurse Practitioner

## 2019-01-22 DIAGNOSIS — Z8249 Family history of ischemic heart disease and other diseases of the circulatory system: Secondary | ICD-10-CM

## 2019-01-22 DIAGNOSIS — Z7901 Long term (current) use of anticoagulants: Secondary | ICD-10-CM

## 2019-01-22 DIAGNOSIS — N183 Chronic kidney disease, stage 3 unspecified: Secondary | ICD-10-CM | POA: Diagnosis present

## 2019-01-22 DIAGNOSIS — Z20828 Contact with and (suspected) exposure to other viral communicable diseases: Secondary | ICD-10-CM | POA: Diagnosis present

## 2019-01-22 DIAGNOSIS — R0602 Shortness of breath: Secondary | ICD-10-CM

## 2019-01-22 DIAGNOSIS — I4891 Unspecified atrial fibrillation: Secondary | ICD-10-CM | POA: Diagnosis present

## 2019-01-22 DIAGNOSIS — E1140 Type 2 diabetes mellitus with diabetic neuropathy, unspecified: Secondary | ICD-10-CM | POA: Diagnosis present

## 2019-01-22 DIAGNOSIS — Z9981 Dependence on supplemental oxygen: Secondary | ICD-10-CM

## 2019-01-22 DIAGNOSIS — M797 Fibromyalgia: Secondary | ICD-10-CM | POA: Diagnosis present

## 2019-01-22 DIAGNOSIS — J9612 Chronic respiratory failure with hypercapnia: Secondary | ICD-10-CM | POA: Diagnosis present

## 2019-01-22 DIAGNOSIS — I5043 Acute on chronic combined systolic (congestive) and diastolic (congestive) heart failure: Secondary | ICD-10-CM | POA: Diagnosis not present

## 2019-01-22 DIAGNOSIS — E785 Hyperlipidemia, unspecified: Secondary | ICD-10-CM | POA: Diagnosis present

## 2019-01-22 DIAGNOSIS — Z87891 Personal history of nicotine dependence: Secondary | ICD-10-CM

## 2019-01-22 DIAGNOSIS — E876 Hypokalemia: Secondary | ICD-10-CM | POA: Diagnosis present

## 2019-01-22 DIAGNOSIS — I130 Hypertensive heart and chronic kidney disease with heart failure and stage 1 through stage 4 chronic kidney disease, or unspecified chronic kidney disease: Secondary | ICD-10-CM | POA: Diagnosis not present

## 2019-01-22 DIAGNOSIS — E1122 Type 2 diabetes mellitus with diabetic chronic kidney disease: Secondary | ICD-10-CM | POA: Diagnosis present

## 2019-01-22 DIAGNOSIS — Z794 Long term (current) use of insulin: Secondary | ICD-10-CM

## 2019-01-22 DIAGNOSIS — I509 Heart failure, unspecified: Secondary | ICD-10-CM

## 2019-01-22 DIAGNOSIS — Z6841 Body Mass Index (BMI) 40.0 and over, adult: Secondary | ICD-10-CM | POA: Diagnosis not present

## 2019-01-22 DIAGNOSIS — I4821 Permanent atrial fibrillation: Secondary | ICD-10-CM | POA: Diagnosis not present

## 2019-01-22 DIAGNOSIS — E1159 Type 2 diabetes mellitus with other circulatory complications: Secondary | ICD-10-CM | POA: Diagnosis present

## 2019-01-22 DIAGNOSIS — I110 Hypertensive heart disease with heart failure: Secondary | ICD-10-CM | POA: Diagnosis not present

## 2019-01-22 DIAGNOSIS — E669 Obesity, unspecified: Secondary | ICD-10-CM | POA: Diagnosis present

## 2019-01-22 DIAGNOSIS — J449 Chronic obstructive pulmonary disease, unspecified: Secondary | ICD-10-CM | POA: Diagnosis present

## 2019-01-22 DIAGNOSIS — Z833 Family history of diabetes mellitus: Secondary | ICD-10-CM

## 2019-01-22 DIAGNOSIS — Z7952 Long term (current) use of systemic steroids: Secondary | ICD-10-CM

## 2019-01-22 DIAGNOSIS — J9610 Chronic respiratory failure, unspecified whether with hypoxia or hypercapnia: Secondary | ICD-10-CM | POA: Diagnosis present

## 2019-01-22 DIAGNOSIS — T502X5A Adverse effect of carbonic-anhydrase inhibitors, benzothiadiazides and other diuretics, initial encounter: Secondary | ICD-10-CM | POA: Diagnosis present

## 2019-01-22 DIAGNOSIS — G4733 Obstructive sleep apnea (adult) (pediatric): Secondary | ICD-10-CM | POA: Diagnosis present

## 2019-01-22 DIAGNOSIS — Z79899 Other long term (current) drug therapy: Secondary | ICD-10-CM

## 2019-01-22 DIAGNOSIS — J9611 Chronic respiratory failure with hypoxia: Secondary | ICD-10-CM | POA: Diagnosis present

## 2019-01-22 DIAGNOSIS — I10 Essential (primary) hypertension: Secondary | ICD-10-CM | POA: Diagnosis present

## 2019-01-22 DIAGNOSIS — K219 Gastro-esophageal reflux disease without esophagitis: Secondary | ICD-10-CM | POA: Diagnosis present

## 2019-01-22 LAB — CBC
HCT: 35 % — ABNORMAL LOW (ref 36.0–46.0)
Hemoglobin: 10.7 g/dL — ABNORMAL LOW (ref 12.0–15.0)
MCH: 28.8 pg (ref 26.0–34.0)
MCHC: 30.6 g/dL (ref 30.0–36.0)
MCV: 94.3 fL (ref 80.0–100.0)
Platelets: 206 10*3/uL (ref 150–400)
RBC: 3.71 MIL/uL — ABNORMAL LOW (ref 3.87–5.11)
RDW: 13.4 % (ref 11.5–15.5)
WBC: 12.7 10*3/uL — ABNORMAL HIGH (ref 4.0–10.5)
nRBC: 0 % (ref 0.0–0.2)

## 2019-01-22 LAB — BRAIN NATRIURETIC PEPTIDE: B Natriuretic Peptide: 190 pg/mL — ABNORMAL HIGH (ref 0.0–100.0)

## 2019-01-22 LAB — BASIC METABOLIC PANEL
Anion gap: 14 (ref 5–15)
BUN: 37 mg/dL — ABNORMAL HIGH (ref 8–23)
CO2: 33 mmol/L — ABNORMAL HIGH (ref 22–32)
Calcium: 7.8 mg/dL — ABNORMAL LOW (ref 8.9–10.3)
Chloride: 91 mmol/L — ABNORMAL LOW (ref 98–111)
Creatinine, Ser: 1.12 mg/dL — ABNORMAL HIGH (ref 0.44–1.00)
GFR calc Af Amer: 59 mL/min — ABNORMAL LOW (ref >60)
GFR calc non Af Amer: 51 mL/min — ABNORMAL LOW (ref >60)
Glucose, Bld: 190 mg/dL — ABNORMAL HIGH (ref 70–99)
Potassium: 3.1 mmol/L — ABNORMAL LOW (ref 3.5–5.1)
Sodium: 138 mmol/L (ref 135–145)

## 2019-01-22 LAB — PROTIME-INR
INR: 3.8 — ABNORMAL HIGH (ref 0.8–1.2)
Prothrombin Time: 36.8 seconds — ABNORMAL HIGH (ref 11.4–15.2)

## 2019-01-22 LAB — TROPONIN I (HIGH SENSITIVITY)
Troponin I (High Sensitivity): 10 ng/L (ref <18)
Troponin I (High Sensitivity): 11 ng/L (ref <18)

## 2019-01-22 LAB — SARS CORONAVIRUS 2 BY RT PCR (HOSPITAL ORDER, PERFORMED IN ~~LOC~~ HOSPITAL LAB): SARS Coronavirus 2: NEGATIVE

## 2019-01-22 MED ORDER — POTASSIUM CHLORIDE CRYS ER 20 MEQ PO TBCR
40.0000 meq | EXTENDED_RELEASE_TABLET | Freq: Once | ORAL | Status: AC
  Administered 2019-01-22: 40 meq via ORAL
  Filled 2019-01-22: qty 2

## 2019-01-22 MED ORDER — FUROSEMIDE 10 MG/ML IJ SOLN
80.0000 mg | Freq: Once | INTRAMUSCULAR | Status: AC
  Administered 2019-01-22: 80 mg via INTRAVENOUS
  Filled 2019-01-22: qty 8

## 2019-01-22 MED ORDER — FUROSEMIDE 10 MG/ML IJ SOLN: 80.0000 mg | Freq: Once | INTRAMUSCULAR | Status: DC

## 2019-01-22 MED ORDER — OXYCODONE-ACETAMINOPHEN 5-325 MG PO TABS
1.0000 | ORAL_TABLET | Freq: Once | ORAL | Status: AC
  Administered 2019-01-22: 1 via ORAL
  Filled 2019-01-22: qty 1

## 2019-01-22 NOTE — ED Provider Notes (Signed)
[facility] EMERGENCY DEPARTMENT Provider Note   CSN: [id number] Arrival date & time: 01/22/19  1755     History   Chief Complaint Chief Complaint  Patient presents with  . Shortness of Breath    HPI Denise Freeman is a 68 y.o. female presenting for evaluation of shortness of breath.  Patient states over the past 4 days, she has been having worsening shortness of breath.  She reports symptoms are worse when she lays flat and with exertion.  She also reports a mild cough.  This feels consistent with her normal CHF exacerbation.  Patient was seen by her primary care doctor today, is on 160 mg lasix PO. Has been taking medication as prescribed.  Patient reports bilateral leg and abdominal swelling, worse in the past 4 days.  She denies fevers, chills, sore throat, chest pain, nausea, vomiting, abdominal pain.  Patient states she is not urinating much despite the Lasix.   Pt's PCP called the ED asking to admit.  States patient is significantly worse than her baseline, and maxed out on p.o. medication at 160 mg of Lasix a day.  Pt wears 3L O2 at baseline for COPD.   Additional history obtained from chart review.  Patient with a history of permanent A. fib on warfarin, COPD on O2, CHF, depression, GERD, fibromyalgia, neuropathy, diabetes.     HPI  Past Medical History:  Diagnosis Date  . Allergy   . Anemia   . Anxiety   . Asthma   . Atrial fibrillation ([place])   . Bipolar 1 disorder ([place])   . 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 Cholelithiasis    a. 09/2016 s/p Lap Chole.  . Chronic bronchitis ([place])   . Chronic combined systolic and diastolic congestive heart failure ([place])    a. 09/2016 Echo: EF 30-35%.  . Colon polyps   . COPD (chronic obstructive pulmonary disease) ([place])   . Depression   . Family history of adverse reaction to anesthesia    Uncle was positive for malignant hyperthermia; patient had testing done and was negative.  . Fibromyalgia   . GERD  (gastroesophageal reflux disease)   . Gout   . High cholesterol   . History of blood transfusion 06/2015   "bleeding from my rectum"  . History of hiatal hernia   . HOH (hard of hearing)   . Hx of colonic polyps 03/21/2016   3 small adenomas no recall - co-morbidities  . Neuropathy    Disc Back   . NICM (nonischemic cardiomyopathy) ([place])    a. Previously worked up in [place], [place]-->low EF with subsequent recovery.  Multiple caths (last ~ 2014 per pt report)--reportedly nl cors;  b. 09/2016 Echo: EF 30-35%, antsept/apical HK, mild MR, mildly dil LA, mod dil RA;  c. 09/2016 Lexi MV: EF 26%, glob HK, sept DK, med size, mod intensity fixed septal defect - BBB/PVC related artifact, no ischemia.  . On home oxygen therapy    "3L; 24/7" (03/14/2016)  . OSA treated with BiPAP    uses biPAP, 10 (03/14/2016)  . Osteoarthritis   . Oxygen deficiency   . Pneumonia   . Type II diabetes mellitus [facility])     Patient Active Problem List   Diagnosis Date Noted  . Gout 11/16/2017  . Chronic kidney disease 11/16/2017  . Uncontrolled type 2 diabetes mellitus with hyperglycemia ([place]) 11/12/2017  . Pain of left lower extremity 10/24/2017  . Medically complex patient 10/03/2017  .  Chronic pain syndrome 10/03/2017  . Unstable gait 10/03/2017  . Diarrhea 07/29/2017  . Type 2 diabetes mellitus with stage 3 chronic kidney disease, with long-term current use of insulin ([place]) 04/01/2017  . Essential hypertension, benign 04/01/2017  . Mood disorder in conditions classified elsewhere 03/31/2017  . Chronic diastolic congestive heart failure ([place]) 03/12/2017  . Chronic systolic CHF (congestive heart failure) ([place]) 11/05/2016  . Pressure injury of skin 09/09/2016  . NICM (nonischemic cardiomyopathy) ([place])   . Class 2 severe obesity due to excess calories with serious comorbidity and body mass index (BMI) of 38.0 to 38.9 in adult [facility])   . Acute systolic congestive heart failure ([place])   . S/P laparoscopic  cholecystectomy   . Heart failure ([place]) 09/06/2016  . Dyslipidemia 08/07/2016  . Vision loss 04/19/2016  . Hx of colonic polyps 03/21/2016  . Blood in stool   . Benign neoplasm of cecum   . Benign neoplasm of rectum   . CKD (chronic kidney disease), stage III ([place]) 03/14/2016  . Mixed hyperlipidemia 03/14/2016  . Scalp lesion 03/14/2016  . Peripheral polyneuropathy 01/26/2016  . Vitamin D deficiency 09/26/2015  . DM type 2 causing vascular disease ([place]) 08/28/2015  . Chronic upper GI bleeding 06/24/2015  . COPD with asthma ([place]) 02/22/2015  . OSA (obstructive sleep apnea) 08/22/2014  . Other emphysema ([place]) 05/29/2014  . Chronic hypoxic, hypercapnic respiratory failure 04/21/2014  . Microcytic anemia 04/21/2014  . Fibromyalgia 04/14/2014  . Uncontrolled secondary diabetes mellitus with stage 3 CKD (GFR 30-59) (HCC) 04/14/2014  . Dyspnea on exertion 04/14/2014  . Acute on chronic combined systolic and diastolic CHF (congestive heart failure) ([place]) 04/14/2014  . Atrial fibrillation [I48.91] 04/12/2014    Past Surgical History:  Procedure Laterality Date  . APPENDECTOMY     "they busted"  . bladder stimulator     pt states, "it cannot be turned off; it's in my right hip; dead battery so it's not working anymore". (03/14/2016)  . BLADDER SUSPENSION     2003, 2006 and 2010  . CATARACT EXTRACTION W/PHACO Right 11/29/2014   Procedure: CATARACT EXTRACTION PHACO AND INTRAOCULAR LENS PLACEMENT (IOC);  Surgeon: Rutherford Guys, MD;  Location: [facility];  Service: Ophthalmology;  Laterality: Right;  CDE:3.81  . CATARACT EXTRACTION W/PHACO Left 12/13/2014   Procedure: CATARACT EXTRACTION PHACO AND INTRAOCULAR LENS PLACEMENT (IOC);  Surgeon: Rutherford Guys, MD;  Location: [facility];  Service: Ophthalmology;  Laterality: Left;  CDE:6.59  . CERVICAL DISC SURGERY N/A 2009   4, 6, and 7 cervical disc replaced  . CHOLECYSTECTOMY N/A 09/13/2016   Procedure: LAPAROSCOPIC CHOLECYSTECTOMY;  Surgeon: Rolm Bookbinder, MD;  Location: [place];  Service: General;  Laterality: N/A;  . COLONOSCOPY WITH PROPOFOL N/A 03/15/2016   Procedure: COLONOSCOPY WITH PROPOFOL;  Surgeon: Gatha Mayer, MD;  Location: [place];  Service: Endoscopy;  Laterality: N/A;  . ESOPHAGOGASTRODUODENOSCOPY (EGD) WITH PROPOFOL N/A 03/15/2016   Procedure: ESOPHAGOGASTRODUODENOSCOPY (EGD) WITH PROPOFOL;  Surgeon: Gatha Mayer, MD;  Location: [place];  Service: Endoscopy;  Laterality: N/A;  . HEEL SPUR SURGERY Bilateral   . HERNIA REPAIR    . I&D EXTREMITY Right 06/13/2015   Procedure: MINOR IRRIGATION AND DEBRIDEMENT EXTREMITY REMOVAL OF NAIL;  Surgeon: Daryll Brod, MD;  Location: [place];  Service: Orthopedics;  Laterality: Right;  . TUBAL LIGATION    . UMBILICAL HERNIA REPAIR     w/mesh     OB History   No obstetric history on file.  Home Medications    Prior to Admission medications   Medication Sig Start Date End Date Taking? Authorizing Provider  albuterol (PROVENTIL) (2.5 MG/3ML) 0.083% nebulizer solution USE (1) IN NEBULIZER EVERY 6 HOURS AS NEEDED FOR SHORTNESS OF BREATH. 10/12/18   Chesley Mires, MD  allopurinol (ZYLOPRIM) 100 MG tablet TAKE 1 TABLET BY MOUTH ONCE DAILY. 12/08/17   Steve Rattler, DO  cetirizine (ZYRTEC) 10 MG tablet Take 1 tablet (10 mg total) by mouth at bedtime. 01/30/17   Rogue Bussing, MD  cholecalciferol (VITAMIN D) 400 units TABS tablet Take 1 tablet (400 Units total) by mouth 2 (two) times daily. 07/17/17   Rogue Bussing, MD  colchicine 0.6 MG tablet Take 1 tablet (0.6 mg total) by mouth daily. Patient taking differently: Take 0.6 mg by mouth as needed.  11/14/17   Rogue Bussing, MD  escitalopram (LEXAPRO) 10 MG tablet Take 1 tablet (10 mg total) by mouth at bedtime. 10/24/17   Norman Clay, MD  ferrous sulfate 324 (65 Fe) MG TBEC Take 1 tablet by mouth daily.    [provider]  furosemide (LASIX) 40 MG tablet TAKE (2)  TABLETS BY MOUTH THREE TIMES DAILY. 10/29/18   Hilty, Nadean Corwin, MD  Insulin Glargine (BASAGLAR KWIKPEN) 100 UNIT/ML SOPN Inject 0.6 mLs (60 Units total) into the skin at bedtime. 11/12/17   Cassandria Anger, MD  insulin lispro (HUMALOG KWIKPEN) 100 UNIT/ML KiwkPen Inject 0.1-0.16 mLs (10-16 Units total) into the skin 3 (three) times daily before meals. 11/12/17   Cassandria Anger, MD  Insulin Pen Needle 32G X 4 MM MISC Use to inject insulin 5 times daily 03/27/18   Freeman Kingdom, MD  ipratropium (ATROVENT) 0.02 % nebulizer solution USE (1) IN NEBULIZER EVERY 6 HOURS AS NEEDED FOR SHORTNESS OF BREATH. 10/12/18   Chesley Mires, MD  losartan (COZAAR) 25 MG tablet Take 1 tablet (25 mg total) by mouth daily. 10/14/18   Hilty, Nadean Corwin, MD  metolazone (ZAROXOLYN) 2.5 MG tablet TAKE 1 TABLET BY MOUTH ONCE DAILY AS NEEDED FOR EDEMA. * TAKE 30 MINUTES BEFORE LASIX* Patient taking differently: daily. TAKE 1 TABLET BY MOUTH ONCE DAILY AS NEEDED FOR EDEMA. * TAKE 30 MINUTES BEFORE LASIX* 06/04/18   Hilty, Nadean Corwin, MD  metoprolol succinate (TOPROL-XL) 25 MG 24 hr tablet Take 1 tablet (25 mg total) by mouth 2 (two) times daily. 08/04/18   Hilty, Nadean Corwin, MD  mirtazapine (REMERON) 15 MG tablet Take 1 tablet (15 mg total) by mouth at bedtime. 10/24/17   Norman Clay, MD  Misc Natural Products (TART CHERRY ADVANCED PO) Take by mouth daily.    [provider]  modafinil (PROVIGIL) 200 MG tablet Take 1 tablet (200 mg total) by mouth daily. 03/24/18   Chesley Mires, MD  mometasone (NASONEX) 50 MCG/ACT nasal spray Place 1 spray into the nose daily. 01/10/17   Chesley Mires, MD  montelukast (SINGULAIR) 10 MG tablet TAKE ONE TABLET BY MOUTH AT BEDTIME. 09/07/18   Chesley Mires, MD  mupirocin cream (BACTROBAN) 2 % Apply 1 application topically 2 (two) times daily. 11/27/17   Rogue Bussing, MD  nystatin (NYSTATIN) powder Apply topically 2 (two) times daily. Apply under breasts 10/09/16   Rogue Bussing, MD  oxyCODONE-acetaminophen (PERCOCET/ROXICET) 5-325 MG tablet Take 1 tablet by mouth 2 (two) times daily as needed for severe pain. 11/27/17   Rogue Bussing, MD  OXYGEN Inhale 3 L into the lungs continuous. 3 liters 24/7  [provider]  pantoprazole (PROTONIX) 20 MG tablet TAKE (1) TABLET BY MOUTH TWICE A DAY BEFORE MEALS. (BREAKFAST AND SUPPER) 12/02/17   Lucila Maine C, DO  potassium chloride (K-DUR) 10 MEQ tablet TAKE 1 TABLET BY MOUTH ONCE DAILY. 06/11/18   Hilty, Nadean Corwin, MD  predniSONE (DELTASONE) 5 MG tablet Take 1 tablet (5 mg total) by mouth daily with breakfast. 11/07/17   Chesley Mires, MD  PRESCRIPTION MEDICATION Inhale into the lungs See admin instructions. Use BIPAP every time laying down    [provider]  primidone (MYSOLINE) 50 MG tablet TAKE ONE TABLET BY MOUTH AT BEDTIME. 11/10/17   Rogue Bussing, MD  Probiotic Product (PROBIOTIC PO) Take 1 tablet by mouth at bedtime.    [provider]  triamcinolone lotion (KENALOG) 0.1 % Apply 1 application topically 3 (three) times daily. For up to 14 days and as needed 01/26/16   [place], Betty G, MD  TRUE [facility] BLOOD GLUCOSE TEST test strip USE TO TEST BLOOD SUGAR AS DIRECTED. 09/24/17   Cassandria Anger, MD  Vitamin A 2400 MCG (8000 UT) CAPS Take 8,000 Units by mouth every morning.     [provider]  warfarin (COUMADIN) 4 MG tablet TAKE 1 TABLET BY MOUTH ONCE DAILY EXCEPT ON THURSDAY TAKE 1/2 TABLET 01/11/19   Hilty, Nadean Corwin, MD    Family History Family History  Problem Relation Age of Onset  . Heart disease Mother   . COPD Mother   . Diabetes Mother   . Breast cancer Mother   . Heart disease Father   . Hyperlipidemia Father   . COPD Sister   . Heart disease Sister   . Diabetes Sister   . Heart disease Maternal Grandmother   . Cancer Maternal Grandmother        stomach  . Cancer Maternal Grandfather        lung   . Bipolar disorder Brother      Social History Social History   Tobacco Use  . Smoking status: Former Smoker    Packs/day: 2.00    Years: 14.00    Pack years: 28.00    Types: Cigarettes    Quit date: 04/12/1989    Years since quitting: 29.8  . Smokeless tobacco: Never Used  Substance Use Topics  . Alcohol use: No    Alcohol/week: 0.0 standard drinks  . Drug use: No     Allergies   Xarelto [rivaroxaban], Ancef [cefazolin], Levaquin [levofloxacin in d5w], Lyrica [pregabalin], Tamiflu [oseltamivir phosphate], Zoloft [sertraline hcl], Augmentin [amoxicillin-pot clavulanate], Ciprofloxacin, Haldol [haloperidol], Nsaids, Penicillins, and Topamax [topiramate]   Review of Systems Review of Systems  Respiratory: Positive for cough and shortness of breath.   Cardiovascular: Positive for leg swelling.  All other systems reviewed and are negative.    Physical Exam Updated Vital Signs BP (!) 142/88 (BP Location: Right Arm)   Pulse 90   Temp 98.6 F (37 C) (Oral)   Resp (!) 22   Ht 4\' 11"  (1.499 m)   Wt 90.7 kg   SpO2 100%   BMI 40.40 kg/m   Physical Exam Vitals signs and nursing note reviewed.  Constitutional:      General: She is not in acute distress.    Appearance: She is well-developed.     Comments: Obese female who appears nontoxic  HENT:     Head: Normocephalic and atraumatic.  Eyes:     Conjunctiva/sclera: Conjunctivae normal.     Pupils: Pupils are equal,  round, and reactive to light.  Neck:     Musculoskeletal: Normal range of motion and neck supple.  Cardiovascular:     Rate and Rhythm: Normal rate and regular rhythm.  Pulmonary:     Effort: Pulmonary effort is normal. No respiratory distress.     Breath sounds: Normal breath sounds. No wheezing.     Comments: Speaking in full sentences.  Minimal wheezing heard in bases bilaterally. Abdominal:     General: There is no distension.     Palpations: Abdomen is soft. There is no mass.     Tenderness: There is no abdominal tenderness.  There is no guarding or rebound.  Musculoskeletal: Normal range of motion.     Comments: Mild bilateral lower leg edema.  Skin:    General: Skin is warm and dry.     Capillary Refill: Capillary refill takes less than 2 seconds.  Neurological:     Mental Status: She is alert and oriented to person, place, and time.      ED Treatments / Results  Labs (all labs ordered are listed, but only abnormal results are displayed) Labs Reviewed  BASIC METABOLIC PANEL - Abnormal; Notable for the following components:      Result Value   Potassium 3.1 (*)    Chloride 91 (*)    CO2 33 (*)    Glucose, Bld 190 (*)    BUN 37 (*)    Creatinine, Ser 1.12 (*)    Calcium 7.8 (*)    GFR calc non Af Amer 51 (*)    GFR calc Af Amer 59 (*)    All other components within normal limits  CBC - Abnormal; Notable for the following components:   WBC 12.7 (*)    RBC 3.71 (*)    Hemoglobin 10.7 (*)    HCT 35.0 (*)    All other components within normal limits  PROTIME-INR - Abnormal; Notable for the following components:   Prothrombin Time 36.8 (*)    INR 3.8 (*)    All other components within normal limits  BRAIN NATRIURETIC PEPTIDE - Abnormal; Notable for the following components:   B Natriuretic Peptide 190.0 (*)    All other components within normal limits  SARS CORONAVIRUS 2 (HOSPITAL ORDER, [place] [facility])  TROPONIN I (HIGH SENSITIVITY)  TROPONIN I (HIGH SENSITIVITY)    EKG None  Radiology Dg Chest 2 View  Result Date: 01/22/2019 CLINICAL DATA:  Worsening shortness of breath. EXAM: CHEST - 2 VIEW COMPARISON:  None. FINDINGS: Increasing infrahilar interstitial opacities with small bilateral effusions. Some mild peribronchovascular cuffing is noted centrally. Septal lines are present in the periphery. Fissural thickening is seen on the lateral radiograph. The heart is enlarged. Cardiomediastinal contours are otherwise unremarkable. An anterior cervical fusion with interbody  spacer placement is noted. No acute osseous or soft tissue abnormality. Degenerative changes are present in the spine and shoulders. IMPRESSION: Findings suggesting CHF with cardiomegaly, edema and small effusions. Electronically Signed   By: Lovena Le M.D.   On: 01/22/2019 15:19    Procedures Procedures (including critical care time)  Medications Ordered in ED Medications  potassium chloride SA (K-DUR) CR tablet 40 mEq (40 mEq Oral Given 01/22/19 2201)  furosemide (LASIX) injection 80 mg (80 mg Intravenous Given 01/22/19 2201)  oxyCODONE-acetaminophen (PERCOCET/ROXICET) 5-325 MG per tablet 1 tablet (1 tablet Oral Given 01/22/19 2201)     Initial Impression / Assessment and Plan / ED Course  I have reviewed the  triage vital signs and the nursing notes.  Pertinent labs & imaging results that were available during my care of the patient were reviewed by me and considered in my medical decision making (see chart for details).        Patient presenting for evaluation of worsening shortness of breath over the past 4 days.  Physical exam shows patient who appears nontoxic.  She has stable sats on her baseline 3 L. However, pt's PCP concerned about worsening respiratory status.  X-ray reviewed from earlier today, patient with edema/effusions.  Labs obtained at triage shows hypokalemia at 3.1, elevated BUN at 37, bicarb of 33.  Calcium low at 7.8.  Warfarin slightly supratherapeutic at 3.8.  Mild leukocytosis at 12.  Hemoglobin stable at 10.  BNP is only mildly elevated at 190, lower than patient's baseline.  Troponin x2 stable. covid pending.  Patient ambulated in the room, became very tachycardic (to 130) and tachypneic (to 32). Will call for admission.   Discussed with Dr. Darrick Meigs from [facility], pt to be admitted.   Final Clinical Impressions(s) / ED Diagnoses   Final diagnoses:  Acute on chronic congestive heart failure, unspecified heart failure type [facility])    ED Discharge Orders    None        Franchot Heidelberg, PA-C 01/22/19 [place], [place], DO 01/28/19 [phone]

## 2019-01-22 NOTE — ED Notes (Signed)
While ambulating pt she became SOB, HR increased to 135, RR increased to 32. PA notified.

## 2019-01-22 NOTE — ED Triage Notes (Addendum)
Pt sent over by Dr. Nevada Crane for CHF. Pt c/o worsening SOB x 4 days. Pt hx of COPD and is on chronic 3L of oxygen. Denies new cough, fever, sore throat, or COVID exposure. Pt takes 160 mg Lasix daily. Pt states PCP told her she has fluid around her heart and lungs.

## 2019-01-23 DIAGNOSIS — I509 Heart failure, unspecified: Secondary | ICD-10-CM | POA: Insufficient documentation

## 2019-01-23 DIAGNOSIS — I5043 Acute on chronic combined systolic (congestive) and diastolic (congestive) heart failure: Secondary | ICD-10-CM

## 2019-01-23 LAB — CBC
HCT: 37.2 % (ref 36.0–46.0)
Hemoglobin: 11.3 g/dL — ABNORMAL LOW (ref 12.0–15.0)
MCH: 29.2 pg (ref 26.0–34.0)
MCHC: 30.4 g/dL (ref 30.0–36.0)
MCV: 96.1 fL (ref 80.0–100.0)
Platelets: 235 10*3/uL (ref 150–400)
RBC: 3.87 MIL/uL (ref 3.87–5.11)
RDW: 13.2 % (ref 11.5–15.5)
WBC: 12.4 10*3/uL — ABNORMAL HIGH (ref 4.0–10.5)
nRBC: 0 % (ref 0.0–0.2)

## 2019-01-23 LAB — GLUCOSE, CAPILLARY: Glucose-Capillary: 214 mg/dL — ABNORMAL HIGH (ref 70–99)

## 2019-01-23 MED ORDER — SODIUM CHLORIDE 0.9% FLUSH
3.0000 mL | Freq: Two times a day (BID) | INTRAVENOUS | Status: DC
  Administered 2019-01-23 – 2019-01-25 (×5): 3 mL via INTRAVENOUS

## 2019-01-23 MED ORDER — SODIUM CHLORIDE 0.9 % IV SOLN: 250.0000 mL | INTRAVENOUS | Status: DC | PRN

## 2019-01-23 MED ORDER — DOCUSATE SODIUM 100 MG PO CAPS
100.0000 mg | ORAL_CAPSULE | Freq: Every day | ORAL | Status: DC
  Administered 2019-01-24: 100 mg via ORAL
  Filled 2019-01-23 (×2): qty 1

## 2019-01-23 MED ORDER — BUDESONIDE 0.5 MG/2ML IN SUSP
0.5000 mg | Freq: Two times a day (BID) | RESPIRATORY_TRACT | Status: DC
  Administered 2019-01-23 – 2019-01-25 (×4): 0.5 mg via RESPIRATORY_TRACT
  Filled 2019-01-23 (×4): qty 2

## 2019-01-23 MED ORDER — ACETAMINOPHEN 650 MG RE SUPP: 650.0000 mg | Freq: Four times a day (QID) | RECTAL | Status: DC | PRN

## 2019-01-23 MED ORDER — POTASSIUM CHLORIDE CRYS ER 20 MEQ PO TBCR
40.0000 meq | EXTENDED_RELEASE_TABLET | Freq: Once | ORAL | Status: AC
  Administered 2019-01-23: 40 meq via ORAL
  Filled 2019-01-23: qty 2

## 2019-01-23 MED ORDER — FLUTICASONE PROPIONATE 50 MCG/ACT NA SUSP
1.0000 | Freq: Two times a day (BID) | NASAL | Status: DC
  Administered 2019-01-23 – 2019-01-25 (×4): 1 via NASAL
  Filled 2019-01-23 (×3): qty 16

## 2019-01-23 MED ORDER — FUROSEMIDE 10 MG/ML IJ SOLN
80.0000 mg | Freq: Two times a day (BID) | INTRAMUSCULAR | Status: DC
  Administered 2019-01-24 – 2019-01-25 (×3): 80 mg via INTRAVENOUS
  Filled 2019-01-23 (×4): qty 8

## 2019-01-23 MED ORDER — MIRTAZAPINE 15 MG PO TABS
15.0000 mg | ORAL_TABLET | Freq: Every day | ORAL | Status: DC
  Administered 2019-01-23 – 2019-01-24 (×2): 15 mg via ORAL
  Filled 2019-01-23 (×2): qty 1

## 2019-01-23 MED ORDER — METOLAZONE 5 MG PO TABS
5.0000 mg | ORAL_TABLET | Freq: Every day | ORAL | Status: DC
  Administered 2019-01-24: 5 mg via ORAL
  Filled 2019-01-23 (×2): qty 1

## 2019-01-23 MED ORDER — LOSARTAN POTASSIUM 50 MG PO TABS
25.0000 mg | ORAL_TABLET | Freq: Every day | ORAL | Status: DC
  Administered 2019-01-23 – 2019-01-25 (×3): 25 mg via ORAL
  Filled 2019-01-23 (×3): qty 1

## 2019-01-23 MED ORDER — INSULIN GLARGINE 100 UNIT/ML ~~LOC~~ SOLN
60.0000 [IU] | Freq: Every day | SUBCUTANEOUS | Status: DC
  Administered 2019-01-23 – 2019-01-24 (×2): 60 [IU] via SUBCUTANEOUS
  Filled 2019-01-23 (×3): qty 0.6

## 2019-01-23 MED ORDER — PRIMIDONE 50 MG PO TABS
50.0000 mg | ORAL_TABLET | Freq: Every day | ORAL | Status: DC
  Administered 2019-01-23 – 2019-01-24 (×2): 50 mg via ORAL
  Filled 2019-01-23 (×2): qty 1

## 2019-01-23 MED ORDER — METOPROLOL SUCCINATE ER 25 MG PO TB24
25.0000 mg | ORAL_TABLET | Freq: Two times a day (BID) | ORAL | Status: DC
  Administered 2019-01-23 – 2019-01-25 (×5): 25 mg via ORAL
  Filled 2019-01-23 (×5): qty 1

## 2019-01-23 MED ORDER — ACETAMINOPHEN 325 MG PO TABS: 650.0000 mg | ORAL_TABLET | Freq: Four times a day (QID) | ORAL | Status: DC | PRN

## 2019-01-23 MED ORDER — ALLOPURINOL 100 MG PO TABS
100.0000 mg | ORAL_TABLET | Freq: Every day | ORAL | Status: DC
  Administered 2019-01-23 – 2019-01-25 (×3): 100 mg via ORAL
  Filled 2019-01-23 (×3): qty 1

## 2019-01-23 MED ORDER — MODAFINIL 200 MG PO TABS
200.0000 mg | ORAL_TABLET | Freq: Every day | ORAL | Status: DC
  Administered 2019-01-23 – 2019-01-25 (×3): 200 mg via ORAL
  Filled 2019-01-23 (×3): qty 1

## 2019-01-23 MED ORDER — ALBUTEROL SULFATE (2.5 MG/3ML) 0.083% IN NEBU: 2.5000 mg | INHALATION_SOLUTION | Freq: Four times a day (QID) | RESPIRATORY_TRACT | Status: DC

## 2019-01-23 MED ORDER — IPRATROPIUM BROMIDE 0.02 % IN SOLN: 0.5000 mg | Freq: Four times a day (QID) | RESPIRATORY_TRACT | Status: DC

## 2019-01-23 MED ORDER — SODIUM CHLORIDE 0.9% FLUSH: 3.0000 mL | INTRAVENOUS | Status: DC | PRN

## 2019-01-23 MED ORDER — MONTELUKAST SODIUM 10 MG PO TABS
10.0000 mg | ORAL_TABLET | Freq: Every day | ORAL | Status: DC
  Administered 2019-01-23 – 2019-01-24 (×2): 10 mg via ORAL
  Filled 2019-01-23 (×2): qty 1

## 2019-01-23 MED ORDER — IPRATROPIUM-ALBUTEROL 0.5-2.5 (3) MG/3ML IN SOLN: 3.0000 mL | Freq: Four times a day (QID) | RESPIRATORY_TRACT | Status: DC | PRN

## 2019-01-23 MED ORDER — FUROSEMIDE 10 MG/ML IJ SOLN
40.0000 mg | Freq: Two times a day (BID) | INTRAMUSCULAR | Status: DC
  Administered 2019-01-23 (×2): 40 mg via INTRAVENOUS
  Filled 2019-01-23 (×2): qty 4

## 2019-01-23 MED ORDER — IPRATROPIUM-ALBUTEROL 0.5-2.5 (3) MG/3ML IN SOLN
3.0000 mL | Freq: Four times a day (QID) | RESPIRATORY_TRACT | Status: DC
  Administered 2019-01-23 – 2019-01-24 (×6): 3 mL via RESPIRATORY_TRACT
  Filled 2019-01-23 (×7): qty 3

## 2019-01-23 MED ORDER — PANTOPRAZOLE SODIUM 40 MG PO TBEC
40.0000 mg | DELAYED_RELEASE_TABLET | Freq: Two times a day (BID) | ORAL | Status: DC
  Administered 2019-01-23 – 2019-01-25 (×5): 40 mg via ORAL
  Filled 2019-01-23 (×5): qty 1

## 2019-01-23 MED ORDER — ESCITALOPRAM OXALATE 10 MG PO TABS
10.0000 mg | ORAL_TABLET | Freq: Every day | ORAL | Status: DC
  Administered 2019-01-23 – 2019-01-24 (×2): 10 mg via ORAL
  Filled 2019-01-23 (×2): qty 1

## 2019-01-23 MED ORDER — FERROUS SULFATE 325 (65 FE) MG PO TABS
325.0000 mg | ORAL_TABLET | Freq: Every day | ORAL | Status: DC
  Administered 2019-01-23 – 2019-01-25 (×3): 325 mg via ORAL
  Filled 2019-01-23 (×3): qty 1

## 2019-01-23 MED ORDER — OXYCODONE-ACETAMINOPHEN 5-325 MG PO TABS
1.0000 | ORAL_TABLET | Freq: Two times a day (BID) | ORAL | Status: DC | PRN
  Administered 2019-01-23 – 2019-01-25 (×3): 1 via ORAL
  Filled 2019-01-23 (×4): qty 1

## 2019-01-23 MED ORDER — PREDNISONE 10 MG PO TABS
5.0000 mg | ORAL_TABLET | Freq: Every day | ORAL | Status: DC
  Administered 2019-01-23 – 2019-01-25 (×3): 5 mg via ORAL
  Filled 2019-01-23 (×3): qty 1

## 2019-01-23 NOTE — Progress Notes (Addendum)
ANTICOAGULATION CONSULT NOTE - Initial Consult  Pharmacy Consult for Coumadin Indication: atrial fibrillation  Allergies  Allergen Reactions  . Xarelto [Rivaroxaban] Other (See Comments)    Internal bleeding  . Ancef [Cefazolin] Nausea And Vomiting  . Levaquin [Levofloxacin In D5w] Other (See Comments)    "afib"  . Lyrica [Pregabalin] Hives  . Tamiflu [Oseltamivir Phosphate] Other (See Comments)    "water blisters"  . Zoloft [Sertraline Hcl] Other (See Comments)    Jaw problems, jittery  . Augmentin [Amoxicillin-Pot Clavulanate] Itching  . Ciprofloxacin Itching and Nausea And Vomiting  . Haldol [Haloperidol] Other (See Comments)    Restless leg  . Nsaids Diarrhea  . Penicillins Itching, Nausea And Vomiting and Rash    Has patient had a PCN reaction causing immediate rash, facial/tongue/throat swelling, SOB or lightheadedness with hypotension: Yes Has patient had a PCN reaction causing severe rash involving mucus membranes or skin necrosis: No Has patient had a PCN reaction that required hospitalization No Has patient had a PCN reaction occurring within the last 10 years: Yes If all of the above answers are "NO", then may proceed with Cephalosporin use.  . Topamax [Topiramate] Nausea Only    Patient Measurements: Height: 4\' 11"  (149.9 cm) Weight: 195 lb 8.8 oz (88.7 kg) IBW/kg (Calculated) : 43.2  Vital Signs: Temp: 98.3 F (36.8 C) (08/22 0645) Temp Source: Oral (08/22 0645) BP: 137/104 (08/22 0645) Pulse Rate: 102 (08/22 0645)  Labs: Recent Labs    01/22/19 1822 01/22/19 2051  HGB 10.7*  --   HCT 35.0*  --   PLT 206  --   LABPROT 36.8*  --   INR 3.8*  --   CREATININE 1.12*  --   TROPONINIHS 10 11    Estimated Creatinine Clearance: 47.2 mL/min (A) (by C-G formula based on SCr of 1.12 mg/dL (H)).   Medical History: Past Medical History:  Diagnosis Date  . Allergy   . Anemia   . Anxiety   . Asthma   . Atrial fibrillation ([place])   . Bipolar 1 disorder  ([place])   . 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 Cholelithiasis    a. 09/2016 s/p Lap Chole.  . Chronic bronchitis ([place])   . Chronic combined systolic and diastolic congestive heart failure ([place])    a. 09/2016 Echo: EF 30-35%.  . Colon polyps   . COPD (chronic obstructive pulmonary disease) ([place])   . Depression   . Family history of adverse reaction to anesthesia    Uncle was positive for malignant hyperthermia; patient had testing done and was negative.  . Fibromyalgia   . GERD (gastroesophageal reflux disease)   . Gout   . High cholesterol   . History of blood transfusion 06/2015   "bleeding from my rectum"  . History of hiatal hernia   . HOH (hard of hearing)   . Hx of colonic polyps 03/21/2016   3 small adenomas no recall - co-morbidities  . Neuropathy    Disc Back   . NICM (nonischemic cardiomyopathy) ([place])    a. Previously worked up in [place], [place]-->low EF with subsequent recovery.  Multiple caths (last ~ 2014 per pt report)--reportedly nl cors;  b. 09/2016 Echo: EF 30-35%, antsept/apical HK, mild MR, mildly dil LA, mod dil RA;  c. 09/2016 Lexi MV: EF 26%, glob HK, sept DK, med size, mod intensity fixed septal defect - BBB/PVC related artifact, no ischemia.  . On home oxygen therapy    "3L; 24/7" (  03/14/2016)  . OSA treated with BiPAP    uses biPAP, 10 (03/14/2016)  . Osteoarthritis   . Oxygen deficiency   . Pneumonia   . Type II diabetes mellitus (HCC)     Medications:  Medications Prior to Admission  Medication Sig Dispense Refill Last Dose  . albuterol (PROVENTIL) (2.5 MG/3ML) 0.083% nebulizer solution USE (1) IN NEBULIZER EVERY 6 HOURS AS NEEDED FOR SHORTNESS OF BREATH. 75 mL 0   . allopurinol (ZYLOPRIM) 100 MG tablet TAKE 1 TABLET BY MOUTH ONCE DAILY. 30 tablet 0   . cetirizine (ZYRTEC) 10 MG tablet Take 1 tablet (10 mg total) by mouth at bedtime. 30 tablet 11   . cholecalciferol (VITAMIN D) 400 units TABS tablet Take 1 tablet (400 Units total) by mouth  2 (two) times daily. 180 each 3   . colchicine 0.6 MG tablet Take 1 tablet (0.6 mg total) by mouth daily. (Patient taking differently: Take 0.6 mg by mouth as needed. ) 30 tablet 1   . escitalopram (LEXAPRO) 10 MG tablet Take 1 tablet (10 mg total) by mouth at bedtime. 90 tablet 0   . ferrous sulfate 324 (65 Fe) MG TBEC Take 1 tablet by mouth daily.     . furosemide (LASIX) 40 MG tablet TAKE (2) TABLETS BY MOUTH THREE TIMES DAILY. 168 tablet 10   . Insulin Glargine (BASAGLAR KWIKPEN) 100 UNIT/ML SOPN Inject 0.6 mLs (60 Units total) into the skin at bedtime. 15 mL 2   . insulin lispro (HUMALOG KWIKPEN) 100 UNIT/ML KiwkPen Inject 0.1-0.16 mLs (10-16 Units total) into the skin 3 (three) times daily before meals. 15 mL 2   . Insulin Pen Needle 32G X 4 MM MISC Use to inject insulin 5 times daily 500 each 5   . ipratropium (ATROVENT) 0.02 % nebulizer solution USE (1) IN NEBULIZER EVERY 6 HOURS AS NEEDED FOR SHORTNESS OF BREATH. 62.5 mL 0   . losartan (COZAAR) 25 MG tablet Take 1 tablet (25 mg total) by mouth daily. 90 tablet 1   . metolazone (ZAROXOLYN) 2.5 MG tablet TAKE 1 TABLET BY MOUTH ONCE DAILY AS NEEDED FOR EDEMA. * TAKE 30 MINUTES BEFORE LASIX* (Patient taking differently: daily. TAKE 1 TABLET BY MOUTH ONCE DAILY AS NEEDED FOR EDEMA. * TAKE 30 MINUTES BEFORE LASIX*) 90 tablet 2   . metoprolol succinate (TOPROL-XL) 25 MG 24 hr tablet Take 1 tablet (25 mg total) by mouth 2 (two) times daily. 180 tablet 1   . mirtazapine (REMERON) 15 MG tablet Take 1 tablet (15 mg total) by mouth at bedtime. 90 tablet 0   . Misc Natural Products (TART CHERRY ADVANCED PO) Take by mouth daily.     . modafinil (PROVIGIL) 200 MG tablet Take 1 tablet (200 mg total) by mouth daily. 30 tablet 5   . mometasone (NASONEX) 50 MCG/ACT nasal spray Place 1 spray into the nose daily. 17 g 11   . montelukast (SINGULAIR) 10 MG tablet TAKE ONE TABLET BY MOUTH AT BEDTIME. 30 tablet 6   . mupirocin cream (BACTROBAN) 2 % Apply 1  application topically 2 (two) times daily. 15 g 0   . nystatin (NYSTATIN) powder Apply topically 2 (two) times daily. Apply under breasts 15 g 3   . oxyCODONE-acetaminophen (PERCOCET/ROXICET) 5-325 MG tablet Take 1 tablet by mouth 2 (two) times daily as needed for severe pain. 60 tablet 0   . OXYGEN Inhale 3 L into the lungs continuous. 3 liters 24/7     . pantoprazole (PROTONIX)  20 MG tablet TAKE (1) TABLET BY MOUTH TWICE A DAY BEFORE MEALS. (BREAKFAST AND SUPPER) 56 tablet 0   . potassium chloride (K-DUR) 10 MEQ tablet TAKE 1 TABLET BY MOUTH ONCE DAILY. 28 tablet 10   . predniSONE (DELTASONE) 5 MG tablet Take 1 tablet (5 mg total) by mouth daily with breakfast. 30 tablet 2   . PRESCRIPTION MEDICATION Inhale into the lungs See admin instructions. Use BIPAP every time laying down     . primidone (MYSOLINE) 50 MG tablet TAKE ONE TABLET BY MOUTH AT BEDTIME. 30 tablet 5   . Probiotic Product (PROBIOTIC PO) Take 1 tablet by mouth at bedtime.     . triamcinolone lotion (KENALOG) 0.1 % Apply 1 application topically 3 (three) times daily. For up to 14 days and as needed 60 mL 1   . TRUE METRIX BLOOD GLUCOSE TEST test strip USE TO TEST BLOOD SUGAR AS DIRECTED. 50 each 2   . Vitamin A 2400 MCG (8000 UT) CAPS Take 8,000 Units by mouth every morning.      . warfarin (COUMADIN) 4 MG tablet TAKE 1 TABLET BY MOUTH ONCE DAILY EXCEPT ON THURSDAY TAKE 1/2 TABLET 30 tablet 0     Assessment:  68 y.o. female, with history of chronic combined systolic and diastolic CHF, COPD on 3 days of oxygen at home, hyperlipidemia, GERD, depression came to ED for shortness of breath.  Patient says for the past 4 days she has been having worsening of shortness of breath. Patient on chronic coumadin therapy for afib. INR is supratherapeutic at 3.8.  Home dose is 4mg  daily except 2mg  on Thursday(anticoag clinic has her taking 2mg  on Tuesday instead;either way, it is only 1 day per week).  Goal of Therapy:  INR 2-3 Monitor  platelets by anticoagulation protocol: Yes   Plan:  No coumadin today Daily PT-INR Monitor for S/S of bleeding  Isac Sarna, BS [place]ena [place], BCPS Clinical Pharmacist Pager [phone] 01/23/2019,11:30 AM

## 2019-01-23 NOTE — ED Notes (Signed)
Gauze wrap applied to forearm after pt hit arm on side rail of stretcher.

## 2019-01-23 NOTE — ED Notes (Signed)
ED TO INPATIENT HANDOFF REPORT  ED Nurse Name and Phone #:  Antony Blackbird., RN [phone]  S Name/Age/Gender Denise Freeman 68 y.o. female Room/Bed: APA11/APA11  Code Status   Code Status: Prior  Home/SNF/Other Home Patient oriented to: self, place, time and situation Is this baseline? Yes   Triage Complete: Triage complete  Chief Complaint Shortness of Breath  Triage Note Pt sent over by Dr. Nevada Crane for CHF. Pt c/o worsening SOB x 4 days. Pt hx of COPD and is on chronic 3L of oxygen. Denies new cough, fever, sore throat, or COVID exposure. Pt takes 160 mg Lasix daily. Pt states PCP told her she has fluid around her heart and lungs.   Allergies Allergies  Allergen Reactions  . Xarelto [Rivaroxaban] Other (See Comments)    Internal bleeding  . Ancef [Cefazolin] Nausea And Vomiting  . Levaquin [Levofloxacin In D5w] Other (See Comments)    "afib"  . Lyrica [Pregabalin] Hives  . Tamiflu [Oseltamivir Phosphate] Other (See Comments)    "water blisters"  . Zoloft [Sertraline Hcl] Other (See Comments)    Jaw problems, jittery  . Augmentin [Amoxicillin-Pot Clavulanate] Itching  . Ciprofloxacin Itching and Nausea And Vomiting  . Haldol [Haloperidol] Other (See Comments)    Restless leg  . Nsaids Diarrhea  . Penicillins Itching, Nausea And Vomiting and Rash    Has patient had a PCN reaction causing immediate rash, facial/tongue/throat swelling, SOB or lightheadedness with hypotension: Yes Has patient had a PCN reaction causing severe rash involving mucus membranes or skin necrosis: No Has patient had a PCN reaction that required hospitalization No Has patient had a PCN reaction occurring within the last 10 years: Yes If all of the above answers are "NO", then may proceed with Cephalosporin use.  . Topamax [Topiramate] Nausea Only    Level of Care/Admitting Diagnosis ED Disposition    ED Disposition Condition [facility] Area: [facility] [[id number]]  Level of  Care: Telemetry [5]  Covid Evaluation: Asymptomatic Screening Protocol (No Symptoms)  Diagnosis: Acute exacerbation of CHF (congestive heart failure) ([place]) [062694]  Admitting Physician: Loree Fee  Attending Physician: Oswald Hillock [[id number]  PT Class (Do Not Modify): Observation [104]  PT Acc Code (Do Not Modify): Observation [10022]       B Medical/Surgery History Past Medical History:  Diagnosis Date  . Allergy   . Anemia   . Anxiety   . Asthma   . Atrial fibrillation ([place])   . Bipolar 1 disorder ([place])   . 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 Cholelithiasis    a. 09/2016 s/p Lap Chole.  . Chronic bronchitis ([place])   . Chronic combined systolic and diastolic congestive heart failure ([employer])    a. 09/2016 Echo: EF 30-35%.  . Colon polyps   . COPD (chronic obstructive pulmonary disease) ([place])   . Depression   . Family history of adverse reaction to anesthesia    Uncle was positive for malignant hyperthermia; patient had testing done and was negative.  . Fibromyalgia   . GERD (gastroesophageal reflux disease)   . Gout   . High cholesterol   . History of blood transfusion 06/2015   "bleeding from my rectum"  . History of hiatal hernia   . HOH (hard of hearing)   . Hx of colonic polyps 03/21/2016   3 small adenomas no recall - co-morbidities  . Neuropathy    Disc Back   .  NICM (nonischemic cardiomyopathy) ([place])    a. Previously worked up in [place], [place]-->low EF with subsequent recovery.  Multiple caths (last ~ 2014 per pt report)--reportedly nl cors;  b. 09/2016 Echo: EF 30-35%, antsept/apical HK, mild MR, mildly dil LA, mod dil RA;  c. 09/2016 Lexi MV: EF 26%, glob HK, sept DK, med size, mod intensity fixed septal defect - BBB/PVC related artifact, no ischemia.  . On home oxygen therapy    "3L; 24/7" (03/14/2016)  . OSA treated with BiPAP    uses biPAP, 10 (03/14/2016)  . Osteoarthritis   . Oxygen deficiency   . Pneumonia   . Type II diabetes  mellitus ([place])    Past Surgical History:  Procedure Laterality Date  . APPENDECTOMY     "they busted"  . bladder stimulator     pt states, "it cannot be turned off; it's in my right hip; dead battery so it's not working anymore". (03/14/2016)  . BLADDER SUSPENSION     2003, 2006 and 2010  . CATARACT EXTRACTION W/PHACO Right 11/29/2014   Procedure: CATARACT EXTRACTION PHACO AND INTRAOCULAR LENS PLACEMENT (IOC);  Surgeon: Rutherford Guys, MD;  Location: [facility];  Service: Ophthalmology;  Laterality: Right;  CDE:3.81  . CATARACT EXTRACTION W/PHACO Left 12/13/2014   Procedure: CATARACT EXTRACTION PHACO AND INTRAOCULAR LENS PLACEMENT (IOC);  Surgeon: Rutherford Guys, MD;  Location: [facility];  Service: Ophthalmology;  Laterality: Left;  CDE:6.59  . CERVICAL DISC SURGERY N/A 2009   4, 6, and 7 cervical disc replaced  . CHOLECYSTECTOMY N/A 09/13/2016   Procedure: LAPAROSCOPIC CHOLECYSTECTOMY;  Surgeon: Rolm Bookbinder, MD;  Location: [facility];  Service: General;  Laterality: N/A;  . COLONOSCOPY WITH PROPOFOL N/A 03/15/2016   Procedure: COLONOSCOPY WITH PROPOFOL;  Surgeon: Gatha Mayer, MD;  Location: [facility];  Service: Endoscopy;  Laterality: N/A;  . ESOPHAGOGASTRODUODENOSCOPY (EGD) WITH PROPOFOL N/A 03/15/2016   Procedure: ESOPHAGOGASTRODUODENOSCOPY (EGD) WITH PROPOFOL;  Surgeon: Gatha Mayer, MD;  Location: [facility];  Service: Endoscopy;  Laterality: N/A;  . HEEL SPUR SURGERY Bilateral   . HERNIA REPAIR    . I&D EXTREMITY Right 06/13/2015   Procedure: MINOR IRRIGATION AND DEBRIDEMENT EXTREMITY REMOVAL OF NAIL;  Surgeon: Daryll Brod, MD;  [facility];  Service: Orthopedics;  Laterality: Right;  . TUBAL LIGATION    . UMBILICAL HERNIA REPAIR     w/mesh     A IV Location/Drains/Wounds Patient Lines/Drains/Airways Status   Active Line/Drains/Airways    Name:   Placement date:   Placement time:   Site:   Days:   Peripheral IV 01/22/19 Left Forearm   01/22/19    2202     Forearm   1   Incision - 6 Ports Abdomen 1: Umbilicus 2: Mid;Upper 3: Right;Medial 4: Right;Lateral 5: Left;Medial 6: Left;Lateral   09/13/16    -     862   Pressure Injury 09/09/16 Stage II -  Partial thickness loss of dermis presenting as a shallow open ulcer with a red, pink wound bed without slough.   09/09/16    0229     866          Intake/Output Last 24 hours No intake or output data in the 24 hours ending 01/23/19 0549  Labs/Imaging Results for orders placed or performed during the hospital encounter of 01/22/19 (from the past 48 hour(s))  Basic metabolic panel     Status: Abnormal   Collection Time: 01/22/19  6:22 PM  Result Value Ref Range  Sodium 138 135 - 145 mmol/L   Potassium 3.1 (L) 3.5 - 5.1 mmol/L   Chloride 91 (L) 98 - 111 mmol/L   CO2 33 (H) 22 - 32 mmol/L   Glucose, Bld 190 (H) 70 - 99 mg/dL   BUN 37 (H) 8 - 23 mg/dL   Creatinine, Ser 1.12 (H) 0.44 - 1.00 mg/dL   Calcium 7.8 (L) 8.9 - 10.3 mg/dL   GFR calc non Af Amer 51 (L) >60 mL/min   GFR calc Af Amer 59 (L) >60 mL/min   Anion gap 14 5 - 15    Comment: Performed at [facility], [address] [place], [place] [address]  CBC     Status: Abnormal   Collection Time: 01/22/19  6:22 PM  Result Value Ref Range   WBC 12.7 (H) 4.0 - 10.5 K/uL   RBC 3.71 (L) 3.87 - 5.11 MIL/uL   Hemoglobin 10.7 (L) 12.0 - 15.0 g/dL   HCT 35.0 (L) 36.0 - 46.0 %   MCV 94.3 80.0 - 100.0 fL   MCH 28.8 26.0 - 34.0 pg   MCHC 30.6 30.0 - 36.0 g/dL   RDW 13.4 11.5 - 15.5 %   Platelets 206 150 - 400 K/uL   nRBC 0.0 0.0 - 0.2 %    Comment: Performed at [facility], [address] [place], [place] [address]  Troponin I (High Sensitivity)     Status: None   Collection Time: 01/22/19  6:22 PM  Result Value Ref Range   Troponin I (High Sensitivity) 10 <18 ng/L    Comment: (NOTE) Elevated high sensitivity troponin I (hsTnI) values and significant  changes across serial measurements may suggest ACS but many other  chronic and acute  conditions are known to elevate hsTnI results.  Refer to the "Links" section for chest pain algorithms and additional  guidance. Performed at [facility], [address], [place], [place] [address]   Protime-INR (order if Patient is taking Coumadin / Warfarin)     Status: Abnormal   Collection Time: 01/22/19  6:22 PM  Result Value Ref Range   Prothrombin Time 36.8 (H) 11.4 - 15.2 seconds   INR 3.8 (H) 0.8 - 1.2    Comment: (NOTE) INR goal varies based on device and disease states. Performed at [facility], [address], [place], [place] [address]   Brain natriuretic peptide     Status: Abnormal   Collection Time: 01/22/19  6:24 PM  Result Value Ref Range   B Natriuretic Peptide 190.0 (H) 0.0 - 100.0 pg/mL    Comment: Performed at [facility], [address] [place], [place] [address]  Troponin I (High Sensitivity)     Status: None   Collection Time: 01/22/19  8:51 PM  Result Value Ref Range   Troponin I (High Sensitivity) 11 <18 ng/L    Comment: (NOTE) Elevated high sensitivity troponin I (hsTnI) values and significant  changes across serial measurements may suggest ACS but many other  chronic and acute conditions are known to elevate hsTnI results.  Refer to the "Links" section for chest pain algorithms and additional  guidance. Performed at [facility], [address] [place], [place] [address]   SARS Coronavirus 2 [facility] order, Performed in [facility] hospital lab) Nasopharyngeal Nasopharyngeal Swab     Status: None   Collection Time: 01/22/19  9:11 PM   Specimen: Nasopharyngeal Swab  Result Value Ref Range   SARS Coronavirus 2 NEGATIVE NEGATIVE    Comment: (NOTE) If  result is NEGATIVE SARS-CoV-2 target nucleic acids are NOT DETECTED. The SARS-CoV-2 RNA is generally detectable in upper and lower  respiratory specimens during the acute phase of infection. The lowest  concentration of SARS-CoV-2 viral copies this assay can detect is 250  copies / mL. A negative  result does not preclude SARS-CoV-2 infection  and should not be used as the sole basis for treatment or other  patient management decisions.  A negative result may occur with  improper specimen collection / handling, submission of specimen other  than nasopharyngeal swab, presence of viral mutation(s) within the  areas targeted by this assay, and inadequate number of viral copies  (<250 copies / mL). A negative result must be combined with clinical  observations, patient history, and epidemiological information. If result is POSITIVE SARS-CoV-2 target nucleic acids are DETECTED. The SARS-CoV-2 RNA is generally detectable in upper and lower  respiratory specimens dur ing the acute phase of infection.  Positive  results are indicative of active infection with SARS-CoV-2.  Clinical  correlation with patient history and other diagnostic information is  necessary to determine patient infection status.  Positive results do  not rule out bacterial infection or co-infection with other viruses. If result is PRESUMPTIVE POSTIVE SARS-CoV-2 nucleic acids MAY BE PRESENT.   A presumptive positive result was obtained on the submitted specimen  and confirmed on repeat testing.  While 2019 novel coronavirus  (SARS-CoV-2) nucleic acids may be present in the submitted sample  additional confirmatory testing may be necessary for epidemiological  and / or clinical management purposes  to differentiate between  SARS-CoV-2 and other Sarbecovirus currently known to infect humans.  If clinically indicated additional testing with an alternate test  methodology [id number]) is advised. The SARS-CoV-2 RNA is generally  detectable in upper and lower respiratory sp ecimens during the acute  phase of infection. The expected result is Negative. Fact Sheet for Patients:  StrictlyIdeas.no Fact Sheet for Healthcare Providers: BankingDealers.co.za This test is not yet  approved or cleared by the [place] FDA and has been authorized for detection and/or diagnosis of SARS-CoV-2 by FDA under an Emergency Use Authorization (EUA).  This EUA will remain in effect (meaning this test can be used) for the duration of the COVID-19 declaration under Section 564(b)(1) of the Act, 21 U.S.C. section 360bbb-3(b)(1), unless the authorization is terminated or revoked sooner. Performed at [facility], [address], [place], [place] [address]    Dg Chest 2 View  Result Date: 01/22/2019 CLINICAL DATA:  Worsening shortness of breath. EXAM: CHEST - 2 VIEW COMPARISON:  None. FINDINGS: Increasing infrahilar interstitial opacities with small bilateral effusions. Some mild peribronchovascular cuffing is noted centrally. Septal lines are present in the periphery. Fissural thickening is seen on the lateral radiograph. The heart is enlarged. Cardiomediastinal contours are otherwise unremarkable. An anterior cervical fusion with interbody spacer placement is noted. No acute osseous or soft tissue abnormality. Degenerative changes are present in the spine and shoulders. IMPRESSION: Findings suggesting CHF with cardiomegaly, edema and small effusions. Electronically Signed   By: Lovena Le M.D.   On: 01/22/2019 15:19    Pending Labs [employer] (From admission, onward)    Start     Ordered   Signed and Held  HIV antibody (Routine Testing)  Once,   R     Signed and Held   Signed and Held  CBC  Tomorrow morning,   R     Signed and Held   Signed and Held  Comprehensive metabolic panel  Tomorrow morning,   R     Signed and Held          Vitals/Pain Today's Vitals   01/23/19 0400 01/23/19 0430 01/23/19 0500 01/23/19 0534  BP: 126/71 (!) 110/53 (!) 110/46 118/85  Pulse: 94 86 82 83  Resp: 18   18  Temp:      TempSrc:      SpO2: 99% 98% 97% 100%  Weight:      Height:      PainSc:        Isolation Precautions No active isolations  Medications Medications   potassium chloride SA (K-DUR) CR tablet 40 mEq (40 mEq Oral Given 01/22/19 2201)  furosemide (LASIX) injection 80 mg (80 mg Intravenous Given 01/22/19 2201)  oxyCODONE-acetaminophen (PERCOCET/ROXICET) 5-325 MG per tablet 1 tablet (1 tablet Oral Given 01/22/19 2201)  potassium chloride SA (K-DUR) CR tablet 40 mEq (40 mEq Oral Given 01/23/19 0537)    Mobility walks with device Low fall risk   Focused Assessments    R Recommendations: See Admitting Provider Note  Report given to:   Additional Notes:

## 2019-01-23 NOTE — Progress Notes (Signed)
Patient admitted to the hospital earlier this morning  Patient was seen and examined.  She still feels short of breath.  She has trace to 1+ edema crackles at her bases bilaterally  Patient has been admitted with acute on chronic combined CHF.  She chronically uses 3 L of oxygen at home.  She is currently on twice daily IV Lasix as well as metolazone.  Continue to monitor intake and output.  She is continued on duo nebs and Pulmicort for COPD.  She does not have any wheezing at this time.  She is also continued on home dose of Lantus and is on sliding scale insulin.  Heart rate is currently controlled in atrial fibrillation and anticoagulation with Coumadin as per pharmacy.  [employer]

## 2019-01-23 NOTE — H&P (Signed)
TRH H&P    Patient Demographics:    Denise Freeman, is a 68 y.o. female  MRN: [id number]  DOB - 16-May-1951  Admit Date - 01/22/2019  Referring MD/NP/PA: Thurnell Garbe  Outpatient Primary MD for the patient is Celene Squibb, MD  Patient coming from: Home  Chief complaint-shortness of breath   HPI:    Denise Freeman  is a 67 y.o. female, with history of chronic combined systolic and diastolic CHF, COPD on 3 days of oxygen at home, hyperlipidemia, GERD, depression came to ED for shortness of breath.  Patient says for the past 4 days she has been having worsening of shortness of breath.  Symptoms are worse when she lies flat and with exertion.  Also has mild cough but no phlegm.  She saw her PCP today and chest x-ray was done which showed CHF.  Patient was sent to the ED for IV Lasix.  Patient really takes Lasix 160 mg daily.  She has history of A. fib and is on warfarin.  She denies abdominal pain Denies nausea vomiting or diarrhea Denies chest pain Denies dysuria.    Review of systems:    In addition to the HPI above,    All other systems reviewed and are negative.    Past History of the following :    Past Medical History:  Diagnosis Date  . Allergy   . Anemia   . Anxiety   . Asthma   . Atrial fibrillation ([place])   . Bipolar 1 disorder ([place])   . 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 Cholelithiasis    a. 09/2016 s/p Lap Chole.  . Chronic bronchitis ([place])   . Chronic combined systolic and diastolic congestive heart failure ([place])    a. 09/2016 Echo: EF 30-35%.  . Colon polyps   . COPD (chronic obstructive pulmonary disease) ([place])   . Depression   . Family history of adverse reaction to anesthesia    Uncle was positive for malignant hyperthermia; patient had testing done and was negative.  . Fibromyalgia   . GERD (gastroesophageal reflux disease)   . Gout   . High cholesterol   . History  of blood transfusion 06/2015   "bleeding from my rectum"  . History of hiatal hernia   . HOH (hard of hearing)   . Hx of colonic polyps 03/21/2016   3 small adenomas no recall - co-morbidities  . Neuropathy    Disc Back   . NICM (nonischemic cardiomyopathy) ([place])    a. Previously worked up in [place], MD-->low EF with subsequent recovery.  Multiple caths (last ~ 2014 per pt report)--reportedly nl cors;  b. 09/2016 Echo: EF 30-35%, antsept/apical HK, mild MR, mildly dil LA, mod dil RA;  c. 09/2016 Lexi MV: EF 26%, glob HK, sept DK, med size, mod intensity fixed septal defect - BBB/PVC related artifact, no ischemia.  . On home oxygen therapy    "3L; 24/7" (03/14/2016)  . OSA treated with BiPAP    uses biPAP, 10 (03/14/2016)  . Osteoarthritis   .  Oxygen deficiency   . Pneumonia   . Type II diabetes mellitus ([place])       Past Surgical History:  Procedure Laterality Date  . APPENDECTOMY     "they busted"  . bladder stimulator     pt states, "it cannot be turned off; it's in my right hip; dead battery so it's not working anymore". (03/14/2016)  . BLADDER SUSPENSION     2003, 2006 and 2010  . CATARACT EXTRACTION W/PHACO Right 11/29/2014   Procedure: CATARACT EXTRACTION PHACO AND INTRAOCULAR LENS PLACEMENT (IOC);  Surgeon: Rutherford Guys, MD;  Location: [facility];  Service: Ophthalmology;  Laterality: Right;  CDE:3.81  . CATARACT EXTRACTION W/PHACO Left 12/13/2014   Procedure: CATARACT EXTRACTION PHACO AND INTRAOCULAR LENS PLACEMENT (IOC);  Surgeon: Rutherford Guys, MD;  Location: [facility];  Service: Ophthalmology;  Laterality: Left;  CDE:6.59  . CERVICAL DISC SURGERY N/A 2009   4, 6, and 7 cervical disc replaced  . CHOLECYSTECTOMY N/A 09/13/2016   Procedure: LAPAROSCOPIC CHOLECYSTECTOMY;  Surgeon: Rolm Bookbinder, MD;  Location: [facility];  Service: General;  Laterality: N/A;  . COLONOSCOPY WITH PROPOFOL N/A 03/15/2016   Procedure: COLONOSCOPY WITH PROPOFOL;  Surgeon: Gatha Mayer, MD;  Location: [facility];  Service: Endoscopy;  Laterality: N/A;  . ESOPHAGOGASTRODUODENOSCOPY (EGD) WITH PROPOFOL N/A 03/15/2016   Procedure: ESOPHAGOGASTRODUODENOSCOPY (EGD) WITH PROPOFOL;  Surgeon: Gatha Mayer, MD;  Location: [facility];  Service: Endoscopy;  Laterality: N/A;  . HEEL SPUR SURGERY Bilateral   . HERNIA REPAIR    . I&D EXTREMITY Right 06/13/2015   Procedure: MINOR IRRIGATION AND DEBRIDEMENT EXTREMITY REMOVAL OF NAIL;  Surgeon: Daryll Brod, MD;  Location: [facility];  Service: Orthopedics;  Laterality: Right;  . TUBAL LIGATION    . UMBILICAL HERNIA REPAIR     w/mesh      Social History:      Social History   Tobacco Use  . Smoking status: Former Smoker    Packs/day: 2.00    Years: 14.00    Pack years: 28.00    Types: Cigarettes    Quit date: 04/12/1989    Years since quitting: 29.8  . Smokeless tobacco: Never Used  Substance Use Topics  . Alcohol use: No    Alcohol/week: 0.0 standard drinks       Family History :     Family History  Problem Relation Age of Onset  . Heart disease Mother   . COPD Mother   . Diabetes Mother   . Breast cancer Mother   . Heart disease Father   . Hyperlipidemia Father   . COPD Sister   . Heart disease Sister   . Diabetes Sister   . Heart disease Maternal Grandmother   . Cancer Maternal Grandmother        stomach  . Cancer Maternal Grandfather        lung   . Bipolar disorder Brother       Home Medications:   Prior to Admission medications   Medication Sig Start Date End Date Taking? Authorizing Provider  albuterol (PROVENTIL) (2.5 MG/3ML) 0.083% nebulizer solution USE (1) IN NEBULIZER EVERY 6 HOURS AS NEEDED FOR SHORTNESS OF BREATH. 10/12/18   Chesley Mires, MD  allopurinol (ZYLOPRIM) 100 MG tablet TAKE 1 TABLET BY MOUTH ONCE DAILY. 12/08/17   Steve Rattler, DO  cetirizine (ZYRTEC) 10 MG tablet Take 1 tablet (10 mg total) by mouth at bedtime. 01/30/17   Rogue Bussing, MD  cholecalciferol (VITAMIN  D)  400 units TABS tablet Take 1 tablet (400 Units total) by mouth 2 (two) times daily. 07/17/17   Rogue Bussing, MD  colchicine 0.6 MG tablet Take 1 tablet (0.6 mg total) by mouth daily. Patient taking differently: Take 0.6 mg by mouth as needed.  11/14/17   Rogue Bussing, MD  escitalopram (LEXAPRO) 10 MG tablet Take 1 tablet (10 mg total) by mouth at bedtime. 10/24/17   Norman Clay, MD  ferrous sulfate 324 (65 Fe) MG TBEC Take 1 tablet by mouth daily.    [provider]  furosemide (LASIX) 40 MG tablet TAKE (2) TABLETS BY MOUTH THREE TIMES DAILY. 10/29/18   Hilty, Nadean Corwin, MD  Insulin Glargine (BASAGLAR KWIKPEN) 100 UNIT/ML SOPN Inject 0.6 mLs (60 Units total) into the skin at bedtime. 11/12/17   Cassandria Anger, MD  insulin lispro (HUMALOG KWIKPEN) 100 UNIT/ML KiwkPen Inject 0.1-0.16 mLs (10-16 Units total) into the skin 3 (three) times daily before meals. 11/12/17   Cassandria Anger, MD  Insulin Pen Needle 32G X 4 MM MISC Use to inject insulin 5 times daily 03/27/18   Philemon Kingdom, MD  ipratropium (ATROVENT) 0.02 % nebulizer solution USE (1) IN NEBULIZER EVERY 6 HOURS AS NEEDED FOR SHORTNESS OF BREATH. 10/12/18   Chesley Mires, MD  losartan (COZAAR) 25 MG tablet Take 1 tablet (25 mg total) by mouth daily. 10/14/18   Hilty, Nadean Corwin, MD  metolazone (ZAROXOLYN) 2.5 MG tablet TAKE 1 TABLET BY MOUTH ONCE DAILY AS NEEDED FOR EDEMA. * TAKE 30 MINUTES BEFORE LASIX* Patient taking differently: daily. TAKE 1 TABLET BY MOUTH ONCE DAILY AS NEEDED FOR EDEMA. * TAKE 30 MINUTES BEFORE LASIX* 06/04/18   Hilty, Nadean Corwin, MD  metoprolol succinate (TOPROL-XL) 25 MG 24 hr tablet Take 1 tablet (25 mg total) by mouth 2 (two) times daily. 08/04/18   Hilty, Nadean Corwin, MD  mirtazapine (REMERON) 15 MG tablet Take 1 tablet (15 mg total) by mouth at bedtime. 10/24/17   Norman Clay, MD  Misc Natural Products (TART CHERRY ADVANCED PO) Take by mouth daily.    [provider]   modafinil (PROVIGIL) 200 MG tablet Take 1 tablet (200 mg total) by mouth daily. 03/24/18   Chesley Mires, MD  mometasone (NASONEX) 50 MCG/ACT nasal spray Place 1 spray into the nose daily. 01/10/17   Chesley Mires, MD  montelukast (SINGULAIR) 10 MG tablet TAKE ONE TABLET BY MOUTH AT BEDTIME. 09/07/18   Chesley Mires, MD  mupirocin cream (BACTROBAN) 2 % Apply 1 application topically 2 (two) times daily. 11/27/17   Rogue Bussing, MD  nystatin (NYSTATIN) powder Apply topically 2 (two) times daily. Apply under breasts 10/09/16   Rogue Bussing, MD  oxyCODONE-acetaminophen (PERCOCET/ROXICET) 5-325 MG tablet Take 1 tablet by mouth 2 (two) times daily as needed for severe pain. 11/27/17   Rogue Bussing, MD  OXYGEN Inhale 3 L into the lungs continuous. 3 liters 24/7    [provider]  pantoprazole (PROTONIX) 20 MG tablet TAKE (1) TABLET BY MOUTH TWICE A DAY BEFORE MEALS. (BREAKFAST AND SUPPER) 12/02/17   Lucila Maine C, DO  potassium chloride (K-DUR) 10 MEQ tablet TAKE 1 TABLET BY MOUTH ONCE DAILY. 06/11/18   Hilty, Nadean Corwin, MD  predniSONE (DELTASONE) 5 MG tablet Take 1 tablet (5 mg total) by mouth daily with breakfast. 11/07/17   Chesley Mires, MD  PRESCRIPTION MEDICATION Inhale into the lungs See admin instructions. Use BIPAP every time laying down    [provider]  primidone (MYSOLINE) 50 MG tablet TAKE ONE TABLET BY MOUTH AT BEDTIME. 11/10/17   Rogue Bussing, MD  Probiotic Product (PROBIOTIC PO) Take 1 tablet by mouth at bedtime.    [provider]  triamcinolone lotion (KENALOG) 0.1 % Apply 1 application topically 3 (three) times daily. For up to 14 days and as needed 01/26/16   Martinique, Betty G, MD  TRUE [facility] BLOOD GLUCOSE TEST test strip USE TO TEST BLOOD SUGAR AS DIRECTED. 09/24/17   Cassandria Anger, MD  Vitamin A 2400 MCG (8000 UT) CAPS Take 8,000 Units by mouth every morning.     [provider]  warfarin (COUMADIN) 4 MG  tablet TAKE 1 TABLET BY MOUTH ONCE DAILY EXCEPT ON THURSDAY TAKE 1/2 TABLET 01/11/19   Hilty, Nadean Corwin, MD     Allergies:     Allergies  Allergen Reactions  . Xarelto [Rivaroxaban] Other (See Comments)    Internal bleeding  . Ancef [Cefazolin] Nausea And Vomiting  . Levaquin [Levofloxacin In D5w] Other (See Comments)    "afib"  . Lyrica [Pregabalin] Hives  . Tamiflu [Oseltamivir Phosphate] Other (See Comments)    "water blisters"  . Zoloft [Sertraline Hcl] Other (See Comments)    Jaw problems, jittery  . Augmentin [Amoxicillin-Pot Clavulanate] Itching  . Ciprofloxacin Itching and Nausea And Vomiting  . Haldol [Haloperidol] Other (See Comments)    Restless leg  . Nsaids Diarrhea  . Penicillins Itching, Nausea And Vomiting and Rash    Has patient had a PCN reaction causing immediate rash, facial/tongue/throat swelling, SOB or lightheadedness with hypotension: Yes Has patient had a PCN reaction causing severe rash involving mucus membranes or skin necrosis: No Has patient had a PCN reaction that required hospitalization No Has patient had a PCN reaction occurring within the last 10 years: Yes If all of the above answers are "NO", then may proceed with Cephalosporin use.  . Topamax [Topiramate] Nausea Only     Physical Exam:   Vitals  Blood pressure 118/85, pulse 83, temperature 98.6 F (37 C), temperature source Oral, resp. rate 18, height 4\' 11"  (1.499 m), weight 90.7 kg, SpO2 100 %.  1.  General: Appears in no acute distress  2. Psychiatric: Alert, oriented x3, intact insight and judgment  3. Neurologic: Cranial nerves II through XII grossly intact, motor strength 5/5 in all extremities.  4. HEENMT:  Atraumatic normocephalic, extraocular muscles intact  5. Respiratory : Decreased breath sounds bilaterally  6. Cardiovascular : S1-S2, regular, no murmur auscultated  7. Gastrointestinal:  Abdomen is soft, nontender, no organomegaly      Data Review:    CBC  Recent Labs  Lab 01/22/19 1822  WBC 12.7*  HGB 10.7*  HCT 35.0*  PLT 206  MCV 94.3  MCH 28.8  MCHC 30.6  RDW 13.4   ------------------------------------------------------------------------------------------------------------------  Results for orders placed or performed during the hospital encounter of 01/22/19 (from the past 48 hour(s))  Basic metabolic panel     Status: Abnormal   Collection Time: 01/22/19  6:22 PM  Result Value Ref Range   Sodium 138 135 - 145 mmol/L   Potassium 3.1 (L) 3.5 - 5.1 mmol/L   Chloride 91 (L) 98 - 111 mmol/L   CO2 33 (H) 22 - 32 mmol/L   Glucose, Bld 190 (H) 70 - 99 mg/dL   BUN 37 (H) 8 - 23 mg/dL   Creatinine, Ser 1.12 (H) 0.44 - 1.00 mg/dL   Calcium 7.8 (L) 8.9 -  10.3 mg/dL   GFR calc non Af Amer 51 (L) >60 mL/min   GFR calc Af Amer 59 (L) >60 mL/min   Anion gap 14 5 - 15    Comment: Performed at [facility], [address] [place], [place] [address]  CBC     Status: Abnormal   Collection Time: 01/22/19  6:22 PM  Result Value Ref Range   WBC 12.7 (H) 4.0 - 10.5 K/uL   RBC 3.71 (L) 3.87 - 5.11 MIL/uL   Hemoglobin 10.7 (L) 12.0 - 15.0 g/dL   HCT 35.0 (L) 36.0 - 46.0 %   MCV 94.3 80.0 - 100.0 fL   MCH 28.8 26.0 - 34.0 pg   MCHC 30.6 30.0 - 36.0 g/dL   RDW 13.4 11.5 - 15.5 %   Platelets 206 150 - 400 K/uL   nRBC 0.0 0.0 - 0.2 %    Comment: Performed at [facility], [address], [place], [place] [address]  Troponin I (High Sensitivity)     Status: None   Collection Time: 01/22/19  6:22 PM  Result Value Ref Range   Troponin I (High Sensitivity) 10 <18 ng/L    Comment: (NOTE) Elevated high sensitivity troponin I (hsTnI) values and significant  changes across serial measurements may suggest ACS but many other  chronic and acute conditions are known to elevate hsTnI results.  Refer to the "Links" section for chest pain algorithms and additional  guidance. Performed at [facility], [address], [place], [place] [address]    Protime-INR (order if Patient is taking Coumadin / Warfarin)     Status: Abnormal   Collection Time: 01/22/19  6:22 PM  Result Value Ref Range   Prothrombin Time 36.8 (H) 11.4 - 15.2 seconds   INR 3.8 (H) 0.8 - 1.2    Comment: (NOTE) INR goal varies based on device and disease states. Performed at [facility], [address] [place], [place] [address]   Brain natriuretic peptide     Status: Abnormal   Collection Time: 01/22/19  6:24 PM  Result Value Ref Range   B Natriuretic Peptide 190.0 (H) 0.0 - 100.0 pg/mL    Comment: Performed at [facility], [address], [place], [place] [address]  Troponin I (High Sensitivity)     Status: None   Collection Time: 01/22/19  8:51 PM  Result Value Ref Range   Troponin I (High Sensitivity) 11 <18 ng/L    Comment: (NOTE) Elevated high sensitivity troponin I (hsTnI) values and significant  changes across serial measurements may suggest ACS but many other  chronic and acute conditions are known to elevate hsTnI results.  Refer to the "Links" section for chest pain algorithms and additional  guidance. Performed at [facility], [address] [place],  [address]   SARS Coronavirus 2 [facility] order, Performed in [facility] hospital lab) Nasopharyngeal Nasopharyngeal Swab     Status: None   Collection Time: 01/22/19  9:11 PM   Specimen: Nasopharyngeal Swab  Result Value Ref Range   SARS Coronavirus 2 NEGATIVE NEGATIVE    Comment: (NOTE) If result is NEGATIVE SARS-CoV-2 target nucleic acids are NOT DETECTED. The SARS-CoV-2 RNA is generally detectable in upper and lower  respiratory specimens during the acute phase of infection. The lowest  concentration of SARS-CoV-2 viral copies this assay can detect is 250  copies / mL. A negative result does not preclude SARS-CoV-2 infection  and should not be used as the sole basis for treatment or other  patient management decisions.  A negative result may occur with  improper specimen collection  / handling, submission of specimen other  than nasopharyngeal swab, presence of viral mutation(s) within the  areas targeted by this assay, and inadequate number of viral copies  (<250 copies / mL). A negative result must be combined with clinical  observations, patient history, and epidemiological information. If result is POSITIVE SARS-CoV-2 target nucleic acids are DETECTED. The SARS-CoV-2 RNA is generally detectable in upper and lower  respiratory specimens dur ing the acute phase of infection.  Positive  results are indicative of active infection with SARS-CoV-2.  Clinical  correlation with patient history and other diagnostic information is  necessary to determine patient infection status.  Positive results do  not rule out bacterial infection or co-infection with other viruses. If result is PRESUMPTIVE POSTIVE SARS-CoV-2 nucleic acids MAY BE PRESENT.   A presumptive positive result was obtained on the submitted specimen  and confirmed on repeat testing.  While 2019 novel coronavirus  (SARS-CoV-2) nucleic acids may be present in the submitted sample  additional confirmatory testing may be necessary for epidemiological  and / or clinical management purposes  to differentiate between  SARS-CoV-2 and other Sarbecovirus currently known to infect humans.  If clinically indicated additional testing with an alternate test  methodology [phone]) is advised. The SARS-CoV-2 RNA is generally  detectable in upper and lower respiratory sp ecimens during the acute  phase of infection. The expected result is Negative. Fact Sheet for Patients:  StrictlyIdeas.no Fact Sheet for Healthcare Providers: BankingDealers.co.za This test is not yet approved or cleared by the [place] FDA and has been authorized for detection and/or diagnosis of SARS-CoV-2 by FDA under an Emergency Use Authorization (EUA).  This EUA will remain in effect (meaning this  test can be used) for the duration of the COVID-19 declaration under Section 564(b)(1) of the Act, 21 U.S.C. section 360bbb-3(b)(1), unless the authorization is terminated or revoked sooner. Performed at [facility], [address] [place], [place] [address]     Chemistries  Recent Labs  Lab 01/22/19 1822  NA 138  K 3.1*  CL 91*  CO2 33*  GLUCOSE 190*  BUN 37*  CREATININE 1.12*  CALCIUM 7.8*   ------------------------------------------------------------------------------------------------------------------  ------------------------------------------------------------------------------------------------------------------ GFR: Estimated Creatinine Clearance: 47.9 mL/min (A) (by C-G formula based on SCr of 1.12 mg/dL (H)). Liver Function Tests: No results for input(s): AST, ALT, ALKPHOS, BILITOT, PROT, ALBUMIN in the last 168 hours. No results for input(s): LIPASE, AMYLASE in the last 168 hours. No results for input(s): AMMONIA in the last 168 hours. Coagulation Profile: Recent Labs  Lab 01/22/19 1822  INR 3.8*   Cardiac Enzymes: No results for input(s): CKTOTAL, CKMB, CKMBINDEX, TROPONINI in the last 168 hours. BNP (last 3 results) No results for input(s): PROBNP in the last 8760 hours. HbA1C: No results for input(s): HGBA1C in the last 72 hours. CBG: No results for input(s): GLUCAP in the last 168 hours. Lipid Profile: No results for input(s): CHOL, HDL, LDLCALC, TRIG, CHOLHDL, LDLDIRECT in the last 72 hours. Thyroid Function Tests: No results for input(s): TSH, T4TOTAL, FREET4, T3FREE, THYROIDAB in the last 72 hours. Anemia Panel: No results for input(s): VITAMINB12, FOLATE, FERRITIN, TIBC, IRON, RETICCTPCT in the last 72 hours.  --------------------------------------------------------------------------------------------------------------- Urine analysis:    Component Value Date/Time   COLORURINE YELLOW 09/07/2016 1413   APPEARANCEUR HAZY (A) 09/07/2016 1413    LABSPEC 1.008 09/07/2016 1413   PHURINE 7.0 09/07/2016 1413   GLUCOSEU NEGATIVE 09/07/2016  [place] 09/07/2016 [place] 09/07/2016 1413   KETONESUR NEGATIVE 09/07/2016 1413   PROTEINUR 30 (A) 09/07/2016 1413   UROBILINOGEN 1.0 02/20/2015 1855   NITRITE NEGATIVE 09/07/2016 1413   LEUKOCYTESUR MODERATE (A) 09/07/2016 1413      Imaging Results:    Dg Chest 2 View  Result Date: 01/22/2019 CLINICAL DATA:  Worsening shortness of breath. EXAM: CHEST - 2 VIEW COMPARISON:  None. FINDINGS: Increasing infrahilar interstitial opacities with small bilateral effusions. Some mild peribronchovascular cuffing is noted centrally. Septal lines are present in the periphery. Fissural thickening is seen on the lateral radiograph. The heart is enlarged. Cardiomediastinal contours are otherwise unremarkable. An anterior cervical fusion with interbody spacer placement is noted. No acute osseous or soft tissue abnormality. Degenerative changes are present in the spine and shoulders. IMPRESSION: Findings suggesting CHF with cardiomegaly, edema and small effusions. Electronically Signed   By: Lovena Le M.D.   On: 01/22/2019 15:19    My personal review of EKG: Rhythm NSR, QTC 527   Assessment & Plan:    Active Problems:   Acute exacerbation of CHF (congestive heart failure) ([place])   1. Acute CHF exacerbation, combined systolic and diastolic-last EF from 4496 was 40 to 45% with grade 3 diastolic dysfunction.  [name] BNP is only 190.  She did receive Lasix 80 mg IV x1 last night.  Patient appears close to her baseline.  She is only on 2 to 3 L of oxygen in the ED.  Will start Lasix 40 mg IV every 12 hours.  Continue Cozaar 25 mg p.o. daily.  2. COPD-not in exacerbation, continue DuoNeb every 6 hours.  3. Diabetes mellitus type 2-continue Lantus, will start sliding scale insulin with NovoLog.  4. Atrial fibrillation-heart rate is controlled, continue Coumadin per pharmacy  consultation.    DVT Prophylaxis-  Coumadin  AM Labs Ordered, also please review Full Orders  Family Communication: Admission, patients condition and plan of care including tests being ordered have been discussed with the patient  who indicate understanding and agree with the plan and Code Status.  Code Status:  Full code  Admission status: Observation/Inpatient: Based on patients clinical presentation and evaluation of above clinical data, I have made mention that patient will need less than 2 midnight stay in the hospital.  Time spent in minutes : 60 minutes   Oswald Hillock M.D on 01/23/2019 at 5:43 AM

## 2019-01-24 DIAGNOSIS — J9611 Chronic respiratory failure with hypoxia: Secondary | ICD-10-CM | POA: Diagnosis present

## 2019-01-24 DIAGNOSIS — E1159 Type 2 diabetes mellitus with other circulatory complications: Secondary | ICD-10-CM | POA: Diagnosis not present

## 2019-01-24 DIAGNOSIS — Z20828 Contact with and (suspected) exposure to other viral communicable diseases: Secondary | ICD-10-CM | POA: Diagnosis present

## 2019-01-24 DIAGNOSIS — M797 Fibromyalgia: Secondary | ICD-10-CM | POA: Diagnosis present

## 2019-01-24 DIAGNOSIS — I130 Hypertensive heart and chronic kidney disease with heart failure and stage 1 through stage 4 chronic kidney disease, or unspecified chronic kidney disease: Secondary | ICD-10-CM | POA: Diagnosis present

## 2019-01-24 DIAGNOSIS — J449 Chronic obstructive pulmonary disease, unspecified: Secondary | ICD-10-CM | POA: Diagnosis present

## 2019-01-24 DIAGNOSIS — G4733 Obstructive sleep apnea (adult) (pediatric): Secondary | ICD-10-CM | POA: Diagnosis not present

## 2019-01-24 DIAGNOSIS — Z7952 Long term (current) use of systemic steroids: Secondary | ICD-10-CM | POA: Diagnosis not present

## 2019-01-24 DIAGNOSIS — R0602 Shortness of breath: Secondary | ICD-10-CM | POA: Diagnosis not present

## 2019-01-24 DIAGNOSIS — K219 Gastro-esophageal reflux disease without esophagitis: Secondary | ICD-10-CM | POA: Diagnosis present

## 2019-01-24 DIAGNOSIS — E1122 Type 2 diabetes mellitus with diabetic chronic kidney disease: Secondary | ICD-10-CM | POA: Diagnosis present

## 2019-01-24 DIAGNOSIS — Z79899 Other long term (current) drug therapy: Secondary | ICD-10-CM | POA: Diagnosis not present

## 2019-01-24 DIAGNOSIS — Z87891 Personal history of nicotine dependence: Secondary | ICD-10-CM | POA: Diagnosis not present

## 2019-01-24 DIAGNOSIS — I4821 Permanent atrial fibrillation: Secondary | ICD-10-CM | POA: Diagnosis present

## 2019-01-24 DIAGNOSIS — N183 Chronic kidney disease, stage 3 (moderate): Secondary | ICD-10-CM

## 2019-01-24 DIAGNOSIS — E669 Obesity, unspecified: Secondary | ICD-10-CM | POA: Diagnosis present

## 2019-01-24 DIAGNOSIS — Z7901 Long term (current) use of anticoagulants: Secondary | ICD-10-CM | POA: Diagnosis not present

## 2019-01-24 DIAGNOSIS — Z794 Long term (current) use of insulin: Secondary | ICD-10-CM | POA: Diagnosis not present

## 2019-01-24 DIAGNOSIS — J9612 Chronic respiratory failure with hypercapnia: Secondary | ICD-10-CM | POA: Diagnosis present

## 2019-01-24 DIAGNOSIS — I509 Heart failure, unspecified: Secondary | ICD-10-CM

## 2019-01-24 DIAGNOSIS — I5043 Acute on chronic combined systolic (congestive) and diastolic (congestive) heart failure: Secondary | ICD-10-CM | POA: Diagnosis present

## 2019-01-24 DIAGNOSIS — Z833 Family history of diabetes mellitus: Secondary | ICD-10-CM | POA: Diagnosis not present

## 2019-01-24 DIAGNOSIS — E1140 Type 2 diabetes mellitus with diabetic neuropathy, unspecified: Secondary | ICD-10-CM | POA: Diagnosis present

## 2019-01-24 DIAGNOSIS — I10 Essential (primary) hypertension: Secondary | ICD-10-CM | POA: Diagnosis not present

## 2019-01-24 DIAGNOSIS — Z8249 Family history of ischemic heart disease and other diseases of the circulatory system: Secondary | ICD-10-CM | POA: Diagnosis not present

## 2019-01-24 DIAGNOSIS — E785 Hyperlipidemia, unspecified: Secondary | ICD-10-CM | POA: Diagnosis present

## 2019-01-24 DIAGNOSIS — Z6841 Body Mass Index (BMI) 40.0 and over, adult: Secondary | ICD-10-CM | POA: Diagnosis not present

## 2019-01-24 DIAGNOSIS — E876 Hypokalemia: Secondary | ICD-10-CM | POA: Diagnosis present

## 2019-01-24 DIAGNOSIS — Z9981 Dependence on supplemental oxygen: Secondary | ICD-10-CM | POA: Diagnosis not present

## 2019-01-24 LAB — COMPREHENSIVE METABOLIC PANEL
ALT: 17 U/L (ref 0–44)
AST: 18 U/L (ref 15–41)
Albumin: 3.5 g/dL (ref 3.5–5.0)
Alkaline Phosphatase: 92 U/L (ref 38–126)
Anion gap: 14 (ref 5–15)
BUN: 33 mg/dL — ABNORMAL HIGH (ref 8–23)
CO2: 37 mmol/L — ABNORMAL HIGH (ref 22–32)
Calcium: 8.2 mg/dL — ABNORMAL LOW (ref 8.9–10.3)
Chloride: 94 mmol/L — ABNORMAL LOW (ref 98–111)
Creatinine, Ser: 1.19 mg/dL — ABNORMAL HIGH (ref 0.44–1.00)
GFR calc Af Amer: 55 mL/min — ABNORMAL LOW (ref >60)
GFR calc non Af Amer: 47 mL/min — ABNORMAL LOW (ref >60)
Glucose, Bld: 108 mg/dL — ABNORMAL HIGH (ref 70–99)
Potassium: 2.7 mmol/L — CL (ref 3.5–5.1)
Sodium: 145 mmol/L (ref 135–145)
Total Bilirubin: 0.4 mg/dL (ref 0.3–1.2)
Total Protein: 6.7 g/dL (ref 6.5–8.1)

## 2019-01-24 LAB — BASIC METABOLIC PANEL
Anion gap: 16 — ABNORMAL HIGH (ref 5–15)
BUN: 34 mg/dL — ABNORMAL HIGH (ref 8–23)
CO2: 34 mmol/L — ABNORMAL HIGH (ref 22–32)
Calcium: 8.2 mg/dL — ABNORMAL LOW (ref 8.9–10.3)
Chloride: 91 mmol/L — ABNORMAL LOW (ref 98–111)
Creatinine, Ser: 1.15 mg/dL — ABNORMAL HIGH (ref 0.44–1.00)
GFR calc Af Amer: 57 mL/min — ABNORMAL LOW (ref >60)
GFR calc non Af Amer: 49 mL/min — ABNORMAL LOW (ref >60)
Glucose, Bld: 276 mg/dL — ABNORMAL HIGH (ref 70–99)
Potassium: 3.1 mmol/L — ABNORMAL LOW (ref 3.5–5.1)
Sodium: 141 mmol/L (ref 135–145)

## 2019-01-24 LAB — GLUCOSE, CAPILLARY
Glucose-Capillary: 110 mg/dL — ABNORMAL HIGH (ref 70–99)
Glucose-Capillary: 168 mg/dL — ABNORMAL HIGH (ref 70–99)
Glucose-Capillary: 235 mg/dL — ABNORMAL HIGH (ref 70–99)
Glucose-Capillary: 240 mg/dL — ABNORMAL HIGH (ref 70–99)

## 2019-01-24 LAB — CBC
HCT: 36.4 % (ref 36.0–46.0)
Hemoglobin: 11 g/dL — ABNORMAL LOW (ref 12.0–15.0)
MCH: 28.7 pg (ref 26.0–34.0)
MCHC: 30.2 g/dL (ref 30.0–36.0)
MCV: 95 fL (ref 80.0–100.0)
Platelets: 194 10*3/uL (ref 150–400)
RBC: 3.83 MIL/uL — ABNORMAL LOW (ref 3.87–5.11)
RDW: 13.3 % (ref 11.5–15.5)
WBC: 9.2 10*3/uL (ref 4.0–10.5)
nRBC: 0 % (ref 0.0–0.2)

## 2019-01-24 LAB — HIV ANTIBODY (ROUTINE TESTING W REFLEX): HIV Screen 4th Generation wRfx: NONREACTIVE

## 2019-01-24 LAB — PROTIME-INR
INR: 3.1 — ABNORMAL HIGH (ref 0.8–1.2)
Prothrombin Time: 31.5 seconds — ABNORMAL HIGH (ref 11.4–15.2)

## 2019-01-24 LAB — MAGNESIUM: Magnesium: 1.2 mg/dL — ABNORMAL LOW (ref 1.7–2.4)

## 2019-01-24 MED ORDER — LOPERAMIDE HCL 2 MG PO CAPS: 2.0000 mg | ORAL_CAPSULE | Freq: Four times a day (QID) | ORAL | Status: DC | PRN

## 2019-01-24 MED ORDER — NYSTATIN 100000 UNIT/GM EX POWD
Freq: Two times a day (BID) | CUTANEOUS | Status: DC
  Administered 2019-01-24 – 2019-01-25 (×3): via TOPICAL
  Filled 2019-01-24 (×2): qty 15

## 2019-01-24 MED ORDER — POTASSIUM CHLORIDE CRYS ER 20 MEQ PO TBCR
40.0000 meq | EXTENDED_RELEASE_TABLET | Freq: Once | ORAL | Status: AC
  Administered 2019-01-24: 40 meq via ORAL
  Filled 2019-01-24: qty 2

## 2019-01-24 MED ORDER — MAGNESIUM SULFATE 4 GM/100ML IV SOLN
4.0000 g | Freq: Once | INTRAVENOUS | Status: AC
  Administered 2019-01-24: 4 g via INTRAVENOUS
  Filled 2019-01-24: qty 100

## 2019-01-24 MED ORDER — POTASSIUM CHLORIDE 10 MEQ/100ML IV SOLN
10.0000 meq | INTRAVENOUS | Status: AC
  Administered 2019-01-24 (×6): 10 meq via INTRAVENOUS
  Filled 2019-01-24 (×7): qty 100

## 2019-01-24 NOTE — Progress Notes (Signed)
CRITICAL VALUE ALERT  Critical Value:  Potassium 2.7  Date & Time Notied:  01/24/2019 3643  Provider Notified: Dr. Roderic Palau  Orders Received/Actions taken: no orders given at this time

## 2019-01-24 NOTE — Progress Notes (Addendum)
PROGRESS NOTE    KORTNEE BAS  RCV:[id number] DOB: December 23, 1950 DOA: 01/22/2019 PCP: Celene Squibb, MD    Brief Narrative:  68 year old female with a history of combined CHF, COPD, on home oxygen, presents with shortness of breath felt to be related to decompensated CHF.  Started on intravenous Lasix and continue metolazone.  Overall volume status is improving.   Assessment & Plan:   Active Problems:   Atrial fibrillation [I48.91]   Acute on chronic combined systolic and diastolic CHF (congestive heart failure) (HCC)   Chronic hypoxic, hypercapnic respiratory failure   OSA (obstructive sleep apnea)   COPD with asthma (HCC)   DM type 2 causing vascular disease (HCC)   CKD (chronic kidney disease), stage III (HCC)   Essential hypertension, benign   1. Acute on chronic combined CHF.  Ejection fraction from 05/2018 showed ejection fraction 40 to 45% with grade 3 diastolic dysfunction.  She is currently on intravenous Lasix as well as daily metolazone.  Overall volume status appears to be improving.  Continue current treatments for now. 2. Chronic respiratory failure with hypoxia, chronically on 3 L of oxygen.  She is currently at her baseline oxygen requirement. 3. COPD with asthma.  She is steroid dependent.  Continue Singulair, bronchodilators and inhaled steroids.  No wheezing at this time. 4. Hypokalemia/hypomagnesemia.  Related to diuretics.  Replace. 5. Chronic kidney disease stage III.  Creatinine is currently at baseline.  Continue to monitor in the setting of diuresis. 6. Atrial fibrillation.  Rate is currently controlled.  She is anticoagulated with Coumadin. 7. Diabetes.  Continue on Lantus and sliding scale insulin.  Blood sugars currently stable.   DVT prophylaxis: Warfarin Code Status: Full code Family Communication: None present Disposition Plan: Discharge home once adequately diuresed.  Patient needs continued stay in the hospital to replace her markedly low potassium  with IV potassium runs as well as IV magnesium runs for markedly low magnesium since she is at risk for cardiac arrhythmias.  She also needs continued diuresis with intravenous Lasix for persistent volume overload.     Consultants:     Procedures:     Antimicrobials:       Subjective: Still feels short of breath, not back to baseline.  No chest pain.  Objective: Vitals:   01/24/19 0602 01/24/19 0731 01/24/19 1317 01/24/19 1429  BP: 118/68   (!) 87/56  Pulse: 82   85  Resp:    17  Temp: 97.7 F (36.5 C)   98.3 F (36.8 C)  TempSrc: Oral   Oral  SpO2: 100% 98% 98% 98%  Weight: 87.8 kg     Height:        Intake/Output Summary (Last 24 hours) at 01/24/2019 1634 Last data filed at 01/23/2019 2300 Gross per 24 hour  Intake 240 ml  Output 900 ml  Net -660 ml   Filed Weights   01/23/19 0645 01/23/19 1700 01/24/19 0602  Weight: 88.7 kg 88.3 kg 87.8 kg    Examination:  General exam: Appears calm and comfortable  Respiratory system: Mild crackles at bases. Respiratory effort normal. Cardiovascular system: S1 & S2 heard, RRR. No JVD, murmurs, rubs, gallops or clicks.  Gastrointestinal system: Abdomen is nondistended, soft and nontender. No organomegaly or masses felt. Normal bowel sounds heard. Central nervous system: Alert and oriented. No focal neurological deficits. Extremities: Trace to 1+ edema bilaterally  Skin: No rashes, lesions or ulcers Psychiatry: Judgement and insight appear normal. Mood & affect appropriate.  Data Reviewed: I have personally reviewed following labs and imaging studies  CBC: Recent Labs  Lab 01/22/19 1822 01/23/19 2226 01/24/19 0647  WBC 12.7* 12.4* 9.2  HGB 10.7* 11.3* 11.0*  HCT 35.0* 37.2 36.4  MCV 94.3 96.1 95.0  PLT 206 235 409   Basic Metabolic Panel: Recent Labs  Lab 01/22/19 1822 01/23/19 2226 01/24/19 0647  NA 138 141 145  K 3.1* 3.1* 2.7*  CL 91* 91* 94*  CO2 33* 34* 37*  GLUCOSE 190* 276* 108*  BUN 37*  34* 33*  CREATININE 1.12* 1.15* 1.19*  CALCIUM 7.8* 8.2* 8.2*  MG  --  1.2*  --    GFR: Estimated Creatinine Clearance: 56.2 mL/min (A) (by C-G formula based on SCr of 1.19 mg/dL (H)). Liver Function Tests: Recent Labs  Lab 01/24/19 0647  AST 18  ALT 17  ALKPHOS 92  BILITOT 0.4  PROT 6.7  ALBUMIN 3.5   No results for input(s): LIPASE, AMYLASE in the last 168 hours. No results for input(s): AMMONIA in the last 168 hours. Coagulation Profile: Recent Labs  Lab 01/22/19 1822 01/24/19 0647  INR 3.8* 3.1*   Cardiac Enzymes: No results for input(s): CKTOTAL, CKMB, CKMBINDEX, TROPONINI in the last 168 hours. BNP (last 3 results) No results for input(s): PROBNP in the last 8760 hours. HbA1C: No results for input(s): HGBA1C in the last 72 hours. CBG: Recent Labs  Lab 01/23/19 2305 01/24/19 0717 01/24/19 1150  GLUCAP 214* 110* 168*   Lipid Profile: No results for input(s): CHOL, HDL, LDLCALC, TRIG, CHOLHDL, LDLDIRECT in the last 72 hours. Thyroid Function Tests: No results for input(s): TSH, T4TOTAL, FREET4, T3FREE, THYROIDAB in the last 72 hours. Anemia Panel: No results for input(s): VITAMINB12, FOLATE, FERRITIN, TIBC, IRON, RETICCTPCT in the last 72 hours. Sepsis Labs: No results for input(s): PROCALCITON, LATICACIDVEN in the last 168 hours.  Recent Results (from the past 240 hour(s))  SARS Coronavirus 2 [facility] order, Performed in [facility] hospital lab) Nasopharyngeal Nasopharyngeal Swab     Status: None   Collection Time: 01/22/19  9:11 PM   Specimen: Nasopharyngeal Swab  Result Value Ref Range Status   SARS Coronavirus 2 NEGATIVE NEGATIVE Final    Comment: (NOTE) If result is NEGATIVE SARS-CoV-2 target nucleic acids are NOT DETECTED. The SARS-CoV-2 RNA is generally detectable in upper and lower  respiratory specimens during the acute phase of infection. The lowest  concentration of SARS-CoV-2 viral copies this assay can detect is 250  copies / mL. A  negative result does not preclude SARS-CoV-2 infection  and should not be used as the sole basis for treatment or other  patient management decisions.  A negative result may occur with  improper specimen collection / handling, submission of specimen other  than nasopharyngeal swab, presence of viral mutation(s) within the  areas targeted by this assay, and inadequate number of viral copies  (<250 copies / mL). A negative result must be combined with clinical  observations, patient history, and epidemiological information. If result is POSITIVE SARS-CoV-2 target nucleic acids are DETECTED. The SARS-CoV-2 RNA is generally detectable in upper and lower  respiratory specimens dur ing the acute phase of infection.  Positive  results are indicative of active infection with SARS-CoV-2.  Clinical  correlation with patient history and other diagnostic information is  necessary to determine patient infection status.  Positive results do  not rule out bacterial infection or co-infection with other viruses. If result is PRESUMPTIVE POSTIVE SARS-CoV-2 nucleic acids MAY BE  PRESENT.   A presumptive positive result was obtained on the submitted specimen  and confirmed on repeat testing.  While 2019 novel coronavirus  (SARS-CoV-2) nucleic acids may be present in the submitted sample  additional confirmatory testing may be necessary for epidemiological  and / or clinical management purposes  to differentiate between  SARS-CoV-2 and other Sarbecovirus currently known to infect humans.  If clinically indicated additional testing with an alternate test  methodology [phone]) is advised. The SARS-CoV-2 RNA is generally  detectable in upper and lower respiratory sp ecimens during the acute  phase of infection. The expected result is Negative. Fact Sheet for Patients:  StrictlyIdeas.no Fact Sheet for Healthcare Providers: BankingDealers.co.za This test is not  yet approved or cleared by the [place] FDA and has been authorized for detection and/or diagnosis of SARS-CoV-2 by FDA under an Emergency Use Authorization (EUA).  This EUA will remain in effect (meaning this test can be used) for the duration of the COVID-19 declaration under Section 564(b)(1) of the Act, 21 U.S.C. section 360bbb-3(b)(1), unless the authorization is terminated or revoked sooner. Performed at [facility], [address], [place], [place] [address]          Radiology Studies: No results found.      Scheduled Meds: . allopurinol  100 mg Oral Daily  . budesonide  0.5 mg Nebulization BID  . docusate sodium  100 mg Oral Daily  . escitalopram  10 mg Oral QHS  . ferrous sulfate  325 mg Oral Daily  . fluticasone  1 spray Each Nare BID  . furosemide  80 mg Intravenous Q12H  . insulin glargine  60 Units Subcutaneous QHS  . ipratropium-albuterol  3 mL Nebulization Q6H  . losartan  25 mg Oral Daily  . metolazone  5 mg Oral Daily  . metoprolol succinate  25 mg Oral BID  . mirtazapine  15 mg Oral QHS  . modafinil  200 mg Oral Daily  . montelukast  10 mg Oral QHS  . nystatin   Topical BID  . pantoprazole  40 mg Oral BID  . predniSONE  5 mg Oral Q breakfast  . primidone  50 mg Oral QHS  . sodium chloride flush  3 mL Intravenous Q12H   Continuous Infusions: . sodium chloride    . potassium chloride 10 mEq (01/24/19 1429)     LOS: 0 days    Time spent: 106mins    Kathie Dike, MD [facility]   If 7PM-7AM, please contact night-coverage [email or web address]  01/24/2019, 4:34 PM

## 2019-01-24 NOTE — Progress Notes (Signed)
ANTICOAGULATION CONSULT NOTE -   Pharmacy Consult for Coumadin Indication: atrial fibrillation  Allergies  Allergen Reactions  . Xarelto [Rivaroxaban] Other (See Comments)    Internal bleeding  . Ancef [Cefazolin] Nausea And Vomiting  . Levaquin [Levofloxacin In D5w] Other (See Comments)    "afib"  . Lyrica [Pregabalin] Hives  . Tamiflu [Oseltamivir Phosphate] Other (See Comments)    "water blisters"  . Zoloft [Sertraline Hcl] Other (See Comments)    Jaw problems, jittery  . Augmentin [Amoxicillin-Pot Clavulanate] Itching  . Ciprofloxacin Itching and Nausea And Vomiting  . Haldol [Haloperidol] Other (See Comments)    Restless leg  . Nsaids Diarrhea  . Penicillins Itching, Nausea And Vomiting and Rash    Has patient had a PCN reaction causing immediate rash, facial/tongue/throat swelling, SOB or lightheadedness with hypotension: Yes Has patient had a PCN reaction causing severe rash involving mucus membranes or skin necrosis: No Has patient had a PCN reaction that required hospitalization No Has patient had a PCN reaction occurring within the last 10 years: Yes If all of the above answers are "NO", then may proceed with Cephalosporin use.  . Topamax [Topiramate] Nausea Only    Patient Measurements: Height: 5\' 11"  (180.3 cm) Weight: 193 lb 9 oz (87.8 kg) IBW/kg (Calculated) : 70.8  Vital Signs: Temp: 97.7 F (36.5 C) (08/23 0602) Temp Source: Oral (08/23 0602) BP: 118/68 (08/23 0602) Pulse Rate: 82 (08/23 0602)  Labs: Recent Labs    01/22/19 1822 01/22/19 2051 01/23/19 2226 01/24/19 0647  HGB 10.7*  --  11.3* 11.0*  HCT 35.0*  --  37.2 36.4  PLT 206  --  235 194  LABPROT 36.8*  --   --  31.5*  INR 3.8*  --   --  3.1*  CREATININE 1.12*  --  1.15* 1.19*  TROPONINIHS 10 11  --   --     Estimated Creatinine Clearance: 56.2 mL/min (A) (by C-G formula based on SCr of 1.19 mg/dL (H)).   Medical History: Past Medical History:  Diagnosis Date  . Allergy   .  Anemia   . Anxiety   . Asthma   . Atrial fibrillation ([place])   . Bipolar 1 disorder ([place])   . 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 Cholelithiasis    a. 09/2016 s/p Lap Chole.  . Chronic bronchitis ([place])   . Chronic combined systolic and diastolic congestive heart failure ([place])    a. 09/2016 Echo: EF 30-35%.  . Colon polyps   . COPD (chronic obstructive pulmonary disease) ([place])   . Depression   . Family history of adverse reaction to anesthesia    Uncle was positive for malignant hyperthermia; patient had testing done and was negative.  . Fibromyalgia   . GERD (gastroesophageal reflux disease)   . Gout   . High cholesterol   . History of blood transfusion 06/2015   "bleeding from my rectum"  . History of hiatal hernia   . HOH (hard of hearing)   . Hx of colonic polyps 03/21/2016   3 small adenomas no recall - co-morbidities  . Neuropathy    Disc Back   . NICM (nonischemic cardiomyopathy) ([place])    a. Previously worked up in [place], MD-->low EF with subsequent recovery.  Multiple caths (last ~ 2014 per pt report)--reportedly nl cors;  b. 09/2016 Echo: EF 30-35%, antsept/apical HK, mild MR, mildly dil LA, mod dil RA;  c. 09/2016 Lexi MV: EF 26%, glob HK, sept  DK, med size, mod intensity fixed septal defect - BBB/PVC related artifact, no ischemia.  . On home oxygen therapy    "3L; 24/7" (03/14/2016)  . OSA treated with BiPAP    uses biPAP, 10 (03/14/2016)  . Osteoarthritis   . Oxygen deficiency   . Pneumonia   . Type II diabetes mellitus (HCC)     Medications:  Medications Prior to Admission  Medication Sig Dispense Refill Last Dose  . albuterol (PROVENTIL) (2.5 MG/3ML) 0.083% nebulizer solution USE (1) IN NEBULIZER EVERY 6 HOURS AS NEEDED FOR SHORTNESS OF BREATH. 75 mL 0 Past Week at Unknown time  . allopurinol (ZYLOPRIM) 100 MG tablet TAKE 1 TABLET BY MOUTH ONCE DAILY. 30 tablet 0 Past Week at Unknown time  . budesonide (PULMICORT) 0.5 MG/2ML nebulizer solution  Take 0.5 mg by nebulization 2 (two) times daily.   Past Week at Unknown time  . cetirizine (ZYRTEC) 10 MG tablet Take 1 tablet (10 mg total) by mouth at bedtime. 30 tablet 11 Past Week at Unknown time  . cholecalciferol (VITAMIN D) 400 units TABS tablet Take 1 tablet (400 Units total) by mouth 2 (two) times daily. 180 each 3 Past Week at Unknown time  . colchicine 0.6 MG tablet Take 1 tablet (0.6 mg total) by mouth daily. (Patient taking differently: Take 0.6 mg by mouth as needed. ) 30 tablet 1 Past Week at Unknown time  . docusate sodium (COLACE) 100 MG capsule Take 100 mg by mouth daily.   Past Week at Unknown time  . escitalopram (LEXAPRO) 10 MG tablet Take 1 tablet (10 mg total) by mouth at bedtime. 90 tablet 0 Past Week at Unknown time  . fenofibrate 54 MG tablet Take 54 mg by mouth daily.   Past Week at Unknown time  . FEROSUL 325 (65 Fe) MG tablet Take 1 tablet by mouth daily.   Past Week at Unknown time  . fluticasone (FLONASE) 50 MCG/ACT nasal spray Place 1 spray into both nostrils 2 (two) times daily.   Past Week at Unknown time  . furosemide (LASIX) 40 MG tablet TAKE (2) TABLETS BY MOUTH THREE TIMES DAILY. 168 tablet 10 Past Week at Unknown time  . Insulin Glargine (BASAGLAR KWIKPEN) 100 UNIT/ML SOPN Inject 0.6 mLs (60 Units total) into the skin at bedtime. 15 mL 2 Past Week at Unknown time  . insulin lispro (HUMALOG KWIKPEN) 100 UNIT/ML KiwkPen Inject 0.1-0.16 mLs (10-16 Units total) into the skin 3 (three) times daily before meals. 15 mL 2 Past Week at Unknown time  . ipratropium (ATROVENT) 0.02 % nebulizer solution USE (1) IN NEBULIZER EVERY 6 HOURS AS NEEDED FOR SHORTNESS OF BREATH. 62.5 mL 0 Past Week at Unknown time  . losartan (COZAAR) 25 MG tablet Take 1 tablet (25 mg total) by mouth daily. 90 tablet 1 Past Week at Unknown time  . metolazone (ZAROXOLYN) 2.5 MG tablet TAKE 1 TABLET BY MOUTH ONCE DAILY AS NEEDED FOR EDEMA. * TAKE 30 MINUTES BEFORE LASIX* (Patient taking differently:  daily. TAKE 1 TABLET BY MOUTH ONCE DAILY AS NEEDED FOR EDEMA. * TAKE 30 MINUTES BEFORE LASIX*) 90 tablet 2 Past Week at Unknown time  . metoprolol succinate (TOPROL-XL) 25 MG 24 hr tablet Take 1 tablet (25 mg total) by mouth 2 (two) times daily. 180 tablet 1 Past Week at Unknown time  . mirtazapine (REMERON) 15 MG tablet Take 1 tablet (15 mg total) by mouth at bedtime. 90 tablet 0 Past Week at Unknown time  . Misc  Natural Products (TART CHERRY ADVANCED PO) Take by mouth daily.   Past Week at Unknown time  . modafinil (PROVIGIL) 200 MG tablet Take 1 tablet (200 mg total) by mouth daily. 30 tablet 5 Past Week at Unknown time  . mometasone (NASONEX) 50 MCG/ACT nasal spray Place 1 spray into the nose daily. 17 g 11 Past Week at Unknown time  . montelukast (SINGULAIR) 10 MG tablet TAKE ONE TABLET BY MOUTH AT BEDTIME. 30 tablet 6 Past Week at Unknown time  . mupirocin cream (BACTROBAN) 2 % Apply 1 application topically 2 (two) times daily. 15 g 0 Past Week at Unknown time  . nitroGLYCERIN (NITROSTAT) 0.4 MG SL tablet Place 0.4 mg under the tongue every 5 (five) minutes as needed for chest pain.     Marland Kitchen nystatin (NYSTATIN) powder Apply topically 2 (two) times daily. Apply under breasts 15 g 3 Past Week at Unknown time  . ondansetron (ZOFRAN) 4 MG tablet Take 4 mg by mouth once.   Past Week at Unknown time  . oxyCODONE-acetaminophen (PERCOCET/ROXICET) 5-325 MG tablet Take 1 tablet by mouth 2 (two) times daily as needed for severe pain. 60 tablet 0 Past Week at Unknown time  . pantoprazole (PROTONIX) 20 MG tablet TAKE (1) TABLET BY MOUTH TWICE A DAY BEFORE MEALS. (BREAKFAST AND SUPPER) 56 tablet 0 Past Week at Unknown time  . predniSONE (DELTASONE) 1 MG tablet Take 1 mg by mouth daily with breakfast.   Past Week at Unknown time  . predniSONE (DELTASONE) 5 MG tablet Take 1 tablet (5 mg total) by mouth daily with breakfast. 30 tablet 2 Past Week at Unknown time  . primidone (MYSOLINE) 50 MG tablet TAKE ONE TABLET  BY MOUTH AT BEDTIME. 30 tablet 5 Past Week at Unknown time  . Probiotic Product (PROBIOTIC PO) Take 1 tablet by mouth at bedtime.   Past Week at Unknown time  . Vitamin A 2400 MCG (8000 UT) CAPS Take 8,000 Units by mouth every morning.    Past Week at Unknown time  . warfarin (COUMADIN) 4 MG tablet TAKE 1 TABLET BY MOUTH ONCE DAILY EXCEPT ON THURSDAY TAKE 1/2 TABLET 30 tablet 0 Past Week at Unknown time  . Insulin Pen Needle 32G X 4 MM MISC Use to inject insulin 5 times daily (Patient not taking: Reported on 01/23/2019) 500 each 5 Not Taking at Unknown time  . potassium chloride (K-DUR) 10 MEQ tablet TAKE 1 TABLET BY MOUTH ONCE DAILY. (Patient not taking: Reported on 01/23/2019) 28 tablet 10 Not Taking at Unknown time  . triamcinolone lotion (KENALOG) 0.1 % Apply 1 application topically 3 (three) times daily. For up to 14 days and as needed (Patient not taking: Reported on 01/23/2019) 60 mL 1 Not Taking at Unknown time  . TRUE METRIX BLOOD GLUCOSE TEST test strip USE TO TEST BLOOD SUGAR AS DIRECTED. (Patient not taking: Reported on 01/23/2019) 50 each 2 Not Taking at Unknown time    Assessment:  68 y.o. female, with history of chronic combined systolic and diastolic CHF, COPD on 3 days of oxygen at home, hyperlipidemia, GERD, depression came to ED for shortness of breath.  Patient says for the past 4 days she has been having worsening of shortness of breath. Patient on chronic coumadin therapy for afib. INR is supratherapeutic at 3.8>> 3.1, but trending down.  Home dose is 4mg  daily except 2mg  on Thursday(anticoag clinic has her taking 2mg  on Tuesday instead;either way, it is only 1 day per week).  Goal  of Therapy:  INR 2-3 Monitor platelets by anticoagulation protocol: Yes   Plan:  No coumadin today Daily PT-INR Monitor for S/S of bleeding  Isac Sarna, BS [place], BCPS Clinical Pharmacist Pager [phone] 01/24/2019,8:40 AM

## 2019-01-25 DIAGNOSIS — I10 Essential (primary) hypertension: Secondary | ICD-10-CM

## 2019-01-25 DIAGNOSIS — G4733 Obstructive sleep apnea (adult) (pediatric): Secondary | ICD-10-CM

## 2019-01-25 LAB — BASIC METABOLIC PANEL
Anion gap: 12 (ref 5–15)
BUN: 35 mg/dL — ABNORMAL HIGH (ref 8–23)
CO2: 34 mmol/L — ABNORMAL HIGH (ref 22–32)
Calcium: 8 mg/dL — ABNORMAL LOW (ref 8.9–10.3)
Chloride: 96 mmol/L — ABNORMAL LOW (ref 98–111)
Creatinine, Ser: 1.29 mg/dL — ABNORMAL HIGH (ref 0.44–1.00)
GFR calc Af Amer: 50 mL/min — ABNORMAL LOW (ref >60)
GFR calc non Af Amer: 43 mL/min — ABNORMAL LOW (ref >60)
Glucose, Bld: 161 mg/dL — ABNORMAL HIGH (ref 70–99)
Potassium: 3.4 mmol/L — ABNORMAL LOW (ref 3.5–5.1)
Sodium: 142 mmol/L (ref 135–145)

## 2019-01-25 LAB — PROTIME-INR
INR: 2.2 — ABNORMAL HIGH (ref 0.8–1.2)
Prothrombin Time: 24.4 seconds — ABNORMAL HIGH (ref 11.4–15.2)

## 2019-01-25 LAB — MAGNESIUM: Magnesium: 1.8 mg/dL (ref 1.7–2.4)

## 2019-01-25 LAB — POTASSIUM: Potassium: 3.6 mmol/L (ref 3.5–5.1)

## 2019-01-25 LAB — GLUCOSE, CAPILLARY
Glucose-Capillary: 91 mg/dL (ref 70–99)
Glucose-Capillary: 195 mg/dL — ABNORMAL HIGH (ref 70–99)

## 2019-01-25 MED ORDER — TORSEMIDE 20 MG PO TABS: 60.0000 mg | ORAL_TABLET | Freq: Two times a day (BID) | ORAL | 0 refills | Status: DC

## 2019-01-25 MED ORDER — IPRATROPIUM-ALBUTEROL 0.5-2.5 (3) MG/3ML IN SOLN
3.0000 mL | Freq: Four times a day (QID) | RESPIRATORY_TRACT | Status: DC
  Administered 2019-01-25: 08:00:00 3 mL via RESPIRATORY_TRACT
  Filled 2019-01-25: qty 3

## 2019-01-25 MED ORDER — COLCHICINE 0.6 MG PO TABS: 0.6000 mg | ORAL_TABLET | ORAL | Status: DC | PRN

## 2019-01-25 MED ORDER — POTASSIUM CHLORIDE CRYS ER 20 MEQ PO TBCR
40.0000 meq | EXTENDED_RELEASE_TABLET | Freq: Once | ORAL | Status: AC
  Administered 2019-01-25: 40 meq via ORAL
  Filled 2019-01-25: qty 2

## 2019-01-25 MED ORDER — WARFARIN SODIUM 2 MG PO TABS: 4.0000 mg | ORAL_TABLET | Freq: Once | ORAL | Status: DC

## 2019-01-25 MED ORDER — WARFARIN - PHARMACIST DOSING INPATIENT: Freq: Every day | Status: DC

## 2019-01-25 MED ORDER — METOLAZONE 5 MG PO TABS: ORAL_TABLET | ORAL | 0 refills | Status: DC

## 2019-01-25 NOTE — Progress Notes (Signed)
ANTICOAGULATION CONSULT NOTE -   Pharmacy Consult for Coumadin Indication: atrial fibrillation  Allergies  Allergen Reactions  . Xarelto [Rivaroxaban] Other (See Comments)    Internal bleeding  . Ancef [Cefazolin] Nausea And Vomiting  . Levaquin [Levofloxacin In D5w] Other (See Comments)    "afib"  . Lyrica [Pregabalin] Hives  . Tamiflu [Oseltamivir Phosphate] Other (See Comments)    "water blisters"  . Zoloft [Sertraline Hcl] Other (See Comments)    Jaw problems, jittery  . Augmentin [Amoxicillin-Pot Clavulanate] Itching  . Ciprofloxacin Itching and Nausea And Vomiting  . Haldol [Haloperidol] Other (See Comments)    Restless leg  . Nsaids Diarrhea  . Penicillins Itching, Nausea And Vomiting and Rash    Has patient had a PCN reaction causing immediate rash, facial/tongue/throat swelling, SOB or lightheadedness with hypotension: Yes Has patient had a PCN reaction causing severe rash involving mucus membranes or skin necrosis: No Has patient had a PCN reaction that required hospitalization No Has patient had a PCN reaction occurring within the last 10 years: Yes If all of the above answers are "NO", then may proceed with Cephalosporin use.  . Topamax [Topiramate] Nausea Only    Patient Measurements: Height: 5\' 11"  (180.3 cm) Weight: 196 lb 10.4 oz (89.2 kg) IBW/kg (Calculated) : 70.8  Vital Signs: Temp: 98 F (36.7 C) (08/24 0614) BP: 114/74 (08/24 0614) Pulse Rate: 75 (08/24 0616)  Labs: Recent Labs    01/22/19 1822 01/22/19 2051 01/23/19 2226 01/24/19 0647 01/25/19 0452  HGB 10.7*  --  11.3* 11.0*  --   HCT 35.0*  --  37.2 36.4  --   PLT 206  --  235 194  --   LABPROT 36.8*  --   --  31.5* 24.4*  INR 3.8*  --   --  3.1* 2.2*  CREATININE 1.12*  --  1.15* 1.19* 1.29*  TROPONINIHS 10 11  --   --   --     Estimated Creatinine Clearance: 52.2 mL/min (A) (by C-G formula based on SCr of 1.29 mg/dL (H)).   Medical History: Past Medical History:  Diagnosis Date   . Allergy   . Anemia   . Anxiety   . Asthma   . Atrial fibrillation ([place])   . Bipolar 1 disorder ([place])   . 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 Cholelithiasis    a. 09/2016 s/p Lap Chole.  . Chronic bronchitis ([place])   . Chronic combined systolic and diastolic congestive heart failure ([place])    a. 09/2016 Echo: EF 30-35%.  . Colon polyps   . COPD (chronic obstructive pulmonary disease) ([place])   . Depression   . Family history of adverse reaction to anesthesia    Uncle was positive for malignant hyperthermia; patient had testing done and was negative.  . Fibromyalgia   . GERD (gastroesophageal reflux disease)   . Gout   . High cholesterol   . History of blood transfusion 06/2015   "bleeding from my rectum"  . History of hiatal hernia   . HOH (hard of hearing)   . Hx of colonic polyps 03/21/2016   3 small adenomas no recall - co-morbidities  . Neuropathy    Disc Back   . NICM (nonischemic cardiomyopathy) ([place])    a. Previously worked up in [place], MD-->low EF with subsequent recovery.  Multiple caths (last ~ 2014 per pt report)--reportedly nl cors;  b. 09/2016 Echo: EF 30-35%, antsept/apical HK, mild MR, mildly dil LA, mod  dil RA;  c. 09/2016 Lexi MV: EF 26%, glob HK, sept DK, med size, mod intensity fixed septal defect - BBB/PVC related artifact, no ischemia.  . On home oxygen therapy    "3L; 24/7" (03/14/2016)  . OSA treated with BiPAP    uses biPAP, 10 (03/14/2016)  . Osteoarthritis   . Oxygen deficiency   . Pneumonia   . Type II diabetes mellitus (HCC)     Medications:  Medications Prior to Admission  Medication Sig Dispense Refill Last Dose  . albuterol (PROVENTIL) (2.5 MG/3ML) 0.083% nebulizer solution USE (1) IN NEBULIZER EVERY 6 HOURS AS NEEDED FOR SHORTNESS OF BREATH. 75 mL 0 Past Week at Unknown time  . allopurinol (ZYLOPRIM) 100 MG tablet TAKE 1 TABLET BY MOUTH ONCE DAILY. 30 tablet 0 Past Week at Unknown time  . budesonide (PULMICORT) 0.5 MG/2ML  nebulizer solution Take 0.5 mg by nebulization 2 (two) times daily.   Past Week at Unknown time  . cetirizine (ZYRTEC) 10 MG tablet Take 1 tablet (10 mg total) by mouth at bedtime. 30 tablet 11 Past Week at Unknown time  . cholecalciferol (VITAMIN D) 400 units TABS tablet Take 1 tablet (400 Units total) by mouth 2 (two) times daily. 180 each 3 Past Week at Unknown time  . colchicine 0.6 MG tablet Take 1 tablet (0.6 mg total) by mouth daily. (Patient taking differently: Take 0.6 mg by mouth as needed. ) 30 tablet 1 Past Week at Unknown time  . docusate sodium (COLACE) 100 MG capsule Take 100 mg by mouth daily.   Past Week at Unknown time  . escitalopram (LEXAPRO) 10 MG tablet Take 1 tablet (10 mg total) by mouth at bedtime. 90 tablet 0 Past Week at Unknown time  . fenofibrate 54 MG tablet Take 54 mg by mouth daily.   Past Week at Unknown time  . FEROSUL 325 (65 Fe) MG tablet Take 1 tablet by mouth daily.   Past Week at Unknown time  . fluticasone (FLONASE) 50 MCG/ACT nasal spray Place 1 spray into both nostrils 2 (two) times daily.   Past Week at Unknown time  . furosemide (LASIX) 40 MG tablet TAKE (2) TABLETS BY MOUTH THREE TIMES DAILY. 168 tablet 10 Past Week at Unknown time  . Insulin Glargine (BASAGLAR KWIKPEN) 100 UNIT/ML SOPN Inject 0.6 mLs (60 Units total) into the skin at bedtime. 15 mL 2 Past Week at Unknown time  . insulin lispro (HUMALOG KWIKPEN) 100 UNIT/ML KiwkPen Inject 0.1-0.16 mLs (10-16 Units total) into the skin 3 (three) times daily before meals. 15 mL 2 Past Week at Unknown time  . ipratropium (ATROVENT) 0.02 % nebulizer solution USE (1) IN NEBULIZER EVERY 6 HOURS AS NEEDED FOR SHORTNESS OF BREATH. 62.5 mL 0 Past Week at Unknown time  . losartan (COZAAR) 25 MG tablet Take 1 tablet (25 mg total) by mouth daily. 90 tablet 1 Past Week at Unknown time  . metolazone (ZAROXOLYN) 2.5 MG tablet TAKE 1 TABLET BY MOUTH ONCE DAILY AS NEEDED FOR EDEMA. * TAKE 30 MINUTES BEFORE LASIX* (Patient  taking differently: daily. TAKE 1 TABLET BY MOUTH ONCE DAILY AS NEEDED FOR EDEMA. * TAKE 30 MINUTES BEFORE LASIX*) 90 tablet 2 Past Week at Unknown time  . metoprolol succinate (TOPROL-XL) 25 MG 24 hr tablet Take 1 tablet (25 mg total) by mouth 2 (two) times daily. 180 tablet 1 Past Week at Unknown time  . mirtazapine (REMERON) 15 MG tablet Take 1 tablet (15 mg total) by mouth at  bedtime. 90 tablet 0 Past Week at Unknown time  . Misc Natural Products (TART CHERRY ADVANCED PO) Take by mouth daily.   Past Week at Unknown time  . modafinil (PROVIGIL) 200 MG tablet Take 1 tablet (200 mg total) by mouth daily. 30 tablet 5 Past Week at Unknown time  . mometasone (NASONEX) 50 MCG/ACT nasal spray Place 1 spray into the nose daily. 17 g 11 Past Week at Unknown time  . montelukast (SINGULAIR) 10 MG tablet TAKE ONE TABLET BY MOUTH AT BEDTIME. 30 tablet 6 Past Week at Unknown time  . mupirocin cream (BACTROBAN) 2 % Apply 1 application topically 2 (two) times daily. 15 g 0 Past Week at Unknown time  . nitroGLYCERIN (NITROSTAT) 0.4 MG SL tablet Place 0.4 mg under the tongue every 5 (five) minutes as needed for chest pain.     Marland Kitchen nystatin (NYSTATIN) powder Apply topically 2 (two) times daily. Apply under breasts 15 g 3 Past Week at Unknown time  . ondansetron (ZOFRAN) 4 MG tablet Take 4 mg by mouth once.   Past Week at Unknown time  . oxyCODONE-acetaminophen (PERCOCET/ROXICET) 5-325 MG tablet Take 1 tablet by mouth 2 (two) times daily as needed for severe pain. 60 tablet 0 Past Week at Unknown time  . pantoprazole (PROTONIX) 20 MG tablet TAKE (1) TABLET BY MOUTH TWICE A DAY BEFORE MEALS. (BREAKFAST AND SUPPER) 56 tablet 0 Past Week at Unknown time  . predniSONE (DELTASONE) 1 MG tablet Take 1 mg by mouth daily with breakfast.   Past Week at Unknown time  . predniSONE (DELTASONE) 5 MG tablet Take 1 tablet (5 mg total) by mouth daily with breakfast. 30 tablet 2 Past Week at Unknown time  . primidone (MYSOLINE) 50 MG  tablet TAKE ONE TABLET BY MOUTH AT BEDTIME. 30 tablet 5 Past Week at Unknown time  . Probiotic Product (PROBIOTIC PO) Take 1 tablet by mouth at bedtime.   Past Week at Unknown time  . Vitamin A 2400 MCG (8000 UT) CAPS Take 8,000 Units by mouth every morning.    Past Week at Unknown time  . warfarin (COUMADIN) 4 MG tablet TAKE 1 TABLET BY MOUTH ONCE DAILY EXCEPT ON THURSDAY TAKE 1/2 TABLET 30 tablet 0 Past Week at Unknown time  . Insulin Pen Needle 32G X 4 MM MISC Use to inject insulin 5 times daily (Patient not taking: Reported on 01/23/2019) 500 each 5 Not Taking at Unknown time  . potassium chloride (K-DUR) 10 MEQ tablet TAKE 1 TABLET BY MOUTH ONCE DAILY. (Patient not taking: Reported on 01/23/2019) 28 tablet 10 Not Taking at Unknown time  . triamcinolone lotion (KENALOG) 0.1 % Apply 1 application topically 3 (three) times daily. For up to 14 days and as needed (Patient not taking: Reported on 01/23/2019) 60 mL 1 Not Taking at Unknown time  . TRUE METRIX BLOOD GLUCOSE TEST test strip USE TO TEST BLOOD SUGAR AS DIRECTED. (Patient not taking: Reported on 01/23/2019) 50 each 2 Not Taking at Unknown time    Assessment:  68 y.o. female, with history of chronic combined systolic and diastolic CHF, COPD on 3 days of oxygen at home, hyperlipidemia, GERD, depression came to ED for shortness of breath.  Patient says for the past 4 days she has been having worsening of shortness of breath. Patient on chronic coumadin therapy for afib. INR is supratherapeutic at 3.8>> 3.1>> 2.2.  Home dose is 4mg  daily except 2mg  on Thursday(anticoag clinic has her taking 2mg  on Tuesday instead;either  way, it is only 1 day per week).  Goal of Therapy:  INR 2-3 Monitor platelets by anticoagulation protocol: Yes   Plan:  Coumadin 4mg  x 1 today Daily PT-INR Monitor for S/S of bleeding  Isac Sarna, BS Vena [place], BCPS Clinical Pharmacist Pager [phone] 01/25/2019,9:03 AM

## 2019-01-25 NOTE — TOC Transition Note (Signed)
Transition of Care [facility]) - CM/SW Discharge Note   Patient Details  Name: Denise Freeman MRN: [id number] Date of Birth: May 09, 1951  Transition of Care [facility]) CM/SW Contact:  Demetrio Leighty, Chauncey [place], RN Phone Number: 01/25/2019, 10:44 AM   Clinical Narrative:   Admit with CHF, Afib. From home with husband. Wears oxygen continuously, on 3 liters, provider- [facility]. Walks with RW. Home health RN recommended for disease management. Patient agreeable, elects [place].     Final next level of care: [place] Barriers to Discharge: No Barriers Identified   Patient Goals and CMS Choice Patient states their goals for this hospitalization and ongoing recovery are:: feel better, less short of breath CMS Medicare.gov Compare Post Acute Care list provided to:: Patient Choice offered to / list presented to : Patient       Discharge Plan and Services     Post Acute Care Choice: Home Health                    HH Arranged: RN, Disease Management [place] Agency: [place] ([facility]) Date [facility] Agency Contacted: 01/25/19 Time [place]: 1448 Representative spoke with at [place]: [place] ([place]) Interventions     Readmission Risk Interventions Readmission Risk Prevention Plan 01/25/2019  Transportation Screening Complete  PCP or Specialist Appt within 3-5 Days Complete  HRI or [place] Complete  Social Work Consult for [place] Planning/Counseling Complete  Palliative Care Screening Not Applicable  Medication Review Press photographer) Complete  Some recent data might be hidden       Post Acute Care Choice: [place] arrangements for the past 2 months: Single Family Home        HH Arranged: RN, Disease Management [place] Agency: [place] ([facility]) Date [place]: 01/25/19 Time [place]: 107 Representative spoke with at [place]: [place]  Prior Living  Arrangements/Services Living arrangements for the past 2 months: [place] with:: Spouse   Do you feel safe going back to the place where you live?: Yes      Need for Family Participation in Patient Care: Yes (Comment) Care giver support system in place?: Yes (comment) Current home services: DME(02, RW) Criminal Activity/Legal Involvement Pertinent to Current Situation/Hospitalization: No - Comment as needed  Activities of Daily Living Home Assistive Devices/Equipment: Oxygen, Walker (specify type) ADL Screening (condition at time of admission) [name] cognitive ability adequate to safely complete daily activities?: Yes Is the patient deaf or have difficulty hearing?: No Does the patient have difficulty seeing, even when wearing glasses/contacts?: No Does the patient have difficulty concentrating, remembering, or making decisions?: No Patient able to express need for assistance with ADLs?: No Does the patient have difficulty dressing or bathing?: Yes Independently performs ADLs?: Yes (appropriate for developmental age) Does the patient have difficulty walking or climbing stairs?: No Weakness of Legs: None Weakness of Arms/Hands: None  Permission Sought/Granted Permission sought to share information with : Other (comment) Permission granted to share information with : Yes, Verbal Permission Granted     Permission granted to share info w AGENCY: [facility]        Emotional Assessment   Attitude/Demeanor/Rapport: Engaged Affect (typically observed): Accepting Orientation: : Oriented to Self, Oriented to Place, Oriented to  Time, Oriented to Situation Alcohol / Substance Use: Not Applicable Psych Involvement: No (comment)  Admission diagnosis:  Acute on chronic congestive heart failure, unspecified heart failure  type [facility]) [I50.9] Patient Active Problem List   Diagnosis Date Noted  . CHF exacerbation ([place]) 01/24/2019  . Acute exacerbation of CHF (congestive heart  failure) ([place]) 01/23/2019  . Gout 11/16/2017  . Chronic kidney disease 11/16/2017  . Uncontrolled type 2 diabetes mellitus with hyperglycemia ([place]) 11/12/2017  . Pain of left lower extremity 10/24/2017  . Medically complex patient 10/03/2017  . Chronic pain syndrome 10/03/2017  . Unstable gait 10/03/2017  . Diarrhea 07/29/2017  . Type 2 diabetes mellitus with stage 3 chronic kidney disease, with long-term current use of insulin ([place]) 04/01/2017  . Essential hypertension, benign 04/01/2017  . Mood disorder in conditions classified elsewhere 03/31/2017  . Chronic diastolic congestive heart failure ([place]) 03/12/2017  . Chronic systolic CHF (congestive heart failure) ([place]) 11/05/2016  . Pressure injury of skin 09/09/2016  . NICM (nonischemic cardiomyopathy) ([place])   . Class 2 severe obesity due to excess calories with serious comorbidity and body mass index (BMI) of 38.0 to 38.9 in adult [facility])   . Acute systolic congestive heart failure ([place])   . S/P laparoscopic cholecystectomy   . Heart failure ([place]) 09/06/2016  . Dyslipidemia 08/07/2016  . Vision loss 04/19/2016  . Hx of colonic polyps 03/21/2016  . Blood in stool   . Benign neoplasm of cecum   . Benign neoplasm of rectum   . CKD (chronic kidney disease), stage III ([place]) 03/14/2016  . Mixed hyperlipidemia 03/14/2016  . Scalp lesion 03/14/2016  . Peripheral polyneuropathy 01/26/2016  . Vitamin D deficiency 09/26/2015  . DM type 2 causing vascular disease ([place]) 08/28/2015  . Chronic upper GI bleeding 06/24/2015  . COPD with asthma ([place]) 02/22/2015  . OSA (obstructive sleep apnea) 08/22/2014  . Other emphysema ([place]) 05/29/2014  . Chronic hypoxic, hypercapnic respiratory failure 04/21/2014  . Microcytic anemia 04/21/2014  . Fibromyalgia 04/14/2014  . Uncontrolled secondary diabetes mellitus with stage 3 CKD (GFR 30-59) (HCC) 04/14/2014  . Dyspnea on exertion 04/14/2014  . Acute on chronic combined systolic and diastolic CHF  (congestive heart failure) ([place]) 04/14/2014  . Atrial fibrillation [I48.91] 04/12/2014   PCP:  Celene Squibb, MD Pharmacy:   [place], [place] - [address] [place] [address] Phone: [phone] Fax: [phone]  [place] - [place], [place] - [place] [address] [address] Phone: [phone] Fax: [phone]  [facility] - [place], [address] [place] [address] Phone: [phone] Fax: [phone]  [facility] [place] to [address] [address] Phone: [phone] Fax: [phone]

## 2019-01-25 NOTE — Care Management (Signed)
Home health results   [place] InformationSorted ascending, Select to sort descending  Quality of Patient Care RatingSelect to sort ascending or descending . A rating of 4 or more stars means the agency performed better than most other agencies on selected measures. . A rating of 3 to 3 stars means that the agency performed about the same as most agencies. . A rating of fewer than 3 stars means that the agency's performance was below the average of other agencies on selected measures." class="info" href="javascript:void(0)""  Patient Survey Summary RatingSelect to sort ascending or descending    [facility] [phone]   [place] to my Favorites  Quality of Patient Care Rating4 out of 5 stars Patient Survey Summary Rating4 out of [place] [phone]   [place] to my Favorites  Quality of Patient Care Rating3 out of 5 stars Patient Survey Summary Rating5 out of [place] [phone]   [place] to my Favorites  Quality of Patient Care Rating3 out of 5 stars Patient Survey Summary Rating4 out of [place] [phone]   [place] to my [place]  out of 5 stars Patient Survey Summary Rating4 out of [place] [phone]   [place] to my [place]  out of 5 stars Patient Survey Summary Rating3 out of [place] [phone]   [place] to my Favorites  Quality of Patient Care Rating4 out of 5 stars Patient Survey Summary Rating4 out of [place] [phone]   [place] to my Favorites  Quality of Patient Care Rating4 out of 5 stars Patient Survey Summary Rating4 out of [place] [phone]   [place] to my Favorites  Quality of Patient Care Rating4 out of 5 stars Patient Survey Summary Rating4 out of [place] AGE [phone]   [place] AGE to my Favorites  Quality of Patient Care Rating3 out of 5 stars Patient Survey Summary Rating3 out of 5 stars  [facility] [place] [phone]   Add [facility] [place] to my Favorites  Quality of Patient Care Rating3  out of 5 stars Patient Survey Summary Rating4 out of [place] [phone]   [place] to my Favorites  Quality of Patient Care Rating3 out of 5 stars Patient Survey Summary Rating4 out of 5 stars  [facility] [phone]   Add [facility] to my Favorites  Quality of Patient Care Rating3  out of 5 stars Patient Survey Summary Rating3 out of [place] [phone]   Add [place] to my Favorites  Quality of Patient Care Rating3  out of 5 stars Not [place] (336) [place] to my Favorites  Quality of Patient [place]  out of 5 stars

## 2019-01-25 NOTE — Discharge Summary (Signed)
Physician Discharge Summary  Denise Freeman TOI:[id number] DOB: [birth year] DOA: 01/22/2019  PCP: Celene Squibb, MD  Admit date: 01/22/2019 Discharge date: 01/25/2019  Admitted From: Home Disposition: Home  Recommendations for Outpatient Follow-up:  1. Follow up with PCP in 1-2 weeks 2. Please obtain BMP/CBC in one week 3. Follow-up with primary cardiologist, 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  She was found to be in decompensated CHF.  Patient is on large doses of Lasix and metolazone at home.  She reported taking 80 mg of Lasix p.o. 3 times daily at 2.5 mg of metolazone daily.  She reports adherence to her medication regimen as well as dietary restrictions.  She was treated with intravenous Lasix and had good urine output.  Overall respiratory status has improved back to baseline.  She is back on 3 L and appears to be breathing comfortably.  She is able to ambulate with her walker as per her baseline.  Case was reviewed with Dr. Bronson Ing who recommended changing her diuretics from Lasix 80 mg 3 times daily to torsemide 60 mg twice daily and increasing metolazone from 2.5 mg to 5 mg daily.  He is recommended to follow-up with [name] in the next 1 week and should have repeat basic metabolic panel drawn at that time.  Her creatinine is currently stable and she has chronic kidney disease stage III.  The remainder of her medical problems remained stable.  She has been set up with home health RN to assist with medication management.  Discharge Diagnoses:  Active Problems:   Atrial fibrillation [I48.91]   Acute on chronic combined systolic and  diastolic CHF (congestive heart failure) (HCC)   Chronic hypoxic, hypercapnic respiratory failure   OSA (obstructive sleep apnea)   COPD with asthma ([place])   DM type 2 causing vascular disease (HCC)   CKD (chronic kidney disease), stage III (HCC)   Essential hypertension, benign   CHF exacerbation [facility])    Discharge Instructions  Discharge Instructions    Diet - low sodium heart healthy   Complete by: As directed    Increase activity slowly   Complete by: As directed      Allergies as of 01/25/2019      Reactions   Xarelto [rivaroxaban] Other (See Comments)   Internal bleeding   Ancef [cefazolin] Nausea And Vomiting   Levaquin [levofloxacin In D5w] Other (See Comments)   "afib"   Lyrica [pregabalin] Hives   Tamiflu [oseltamivir Phosphate] Other (See Comments)   "water blisters"   Zoloft [sertraline Hcl] Other (See Comments)   Jaw problems, jittery   Augmentin [amoxicillin-pot Clavulanate] Itching   Ciprofloxacin Itching, Nausea And Vomiting   Haldol [haloperidol] Other (See Comments)   Restless leg   Nsaids Diarrhea   Penicillins Itching, Nausea And Vomiting, Rash   Has patient had a PCN reaction causing immediate rash, facial/tongue/throat swelling, SOB or lightheadedness with hypotension: Yes Has patient had a PCN reaction causing severe rash involving mucus membranes or skin necrosis: No Has patient had a PCN reaction that required hospitalization No Has patient had a PCN reaction occurring within the last 10 years: Yes If all of the above answers  are "NO", then may proceed with Cephalosporin use.   Topamax [topiramate] Nausea Only      Medication List    STOP taking these medications   furosemide 40 MG tablet Commonly known as: LASIX     TAKE these medications   albuterol (2.5 MG/3ML) 0.083% nebulizer solution Commonly known as: PROVENTIL USE (1) IN NEBULIZER EVERY 6 HOURS AS NEEDED FOR SHORTNESS OF BREATH.   allopurinol 100 MG tablet Commonly known as:  ZYLOPRIM TAKE 1 TABLET BY MOUTH ONCE DAILY.   Basaglar KwikPen 100 UNIT/ML Sopn Inject 0.6 mLs (60 Units total) into the skin at bedtime.   budesonide 0.5 MG/2ML nebulizer solution Commonly known as: PULMICORT Take 0.5 mg by nebulization 2 (two) times daily.   cetirizine 10 MG tablet Commonly known as: ZYRTEC Take 1 tablet (10 mg total) by mouth at bedtime.   cholecalciferol 10 MCG (400 UNIT) Tabs tablet Commonly known as: VITAMIN D3 Take 1 tablet (400 Units total) by mouth 2 (two) times daily.   colchicine 0.6 MG tablet Take 1 tablet (0.6 mg total) by mouth as needed.   docusate sodium 100 MG capsule Commonly known as: COLACE Take 100 mg by mouth daily.   escitalopram 10 MG tablet Commonly known as: LEXAPRO Take 1 tablet (10 mg total) by mouth at bedtime.   fenofibrate 54 MG tablet Take 54 mg by mouth daily.   FeroSul 325 (65 FE) MG tablet Generic drug: ferrous sulfate Take 1 tablet by mouth daily.   fluticasone 50 MCG/ACT nasal spray Commonly known as: FLONASE Place 1 spray into both nostrils 2 (two) times daily.   insulin lispro 100 UNIT/ML KiwkPen Commonly known as: HumaLOG KwikPen Inject 0.1-0.16 mLs (10-16 Units total) into the skin 3 (three) times daily before meals.   Insulin Pen Needle 32G X 4 MM Misc Use to inject insulin 5 times daily   ipratropium 0.02 % nebulizer solution Commonly known as: ATROVENT USE (1) IN NEBULIZER EVERY 6 HOURS AS NEEDED FOR SHORTNESS OF BREATH.   losartan 25 MG tablet Commonly known as: COZAAR Take 1 tablet (25 mg total) by mouth daily.   metolazone 5 MG tablet Commonly known as: ZAROXOLYN TAKE 1 TABLET BY MOUTH ONCE DAILY AS NEEDED FOR EDEMA. * TAKE 30 MINUTES BEFORE torsemide* What changed:   medication strength  additional instructions   metoprolol succinate 25 MG 24 hr tablet Commonly known as: TOPROL-XL Take 1 tablet (25 mg total) by mouth 2 (two) times daily.   mirtazapine 15 MG tablet Commonly known as:  REMERON Take 1 tablet (15 mg total) by mouth at bedtime.   modafinil 200 MG tablet Commonly known as: PROVIGIL Take 1 tablet (200 mg total) by mouth daily.   mometasone 50 MCG/ACT nasal spray Commonly known as: NASONEX Place 1 spray into the nose daily.   montelukast 10 MG tablet Commonly known as: SINGULAIR TAKE ONE TABLET BY MOUTH AT BEDTIME.   mupirocin cream 2 % Commonly known as: Bactroban Apply 1 application topically 2 (two) times daily.   nitroGLYCERIN 0.4 MG SL tablet Commonly known as: NITROSTAT Place 0.4 mg under the tongue every 5 (five) minutes as needed for chest pain.   nystatin powder Commonly known as: nystatin Apply topically 2 (two) times daily. Apply under breasts   ondansetron 4 MG tablet Commonly known as: ZOFRAN Take 4 mg by mouth once.   oxyCODONE-acetaminophen 5-325 MG tablet Commonly known as: PERCOCET/ROXICET Take 1 tablet by mouth 2 (two) times daily as needed for severe pain.  pantoprazole 20 MG tablet Commonly known as: PROTONIX TAKE (1) TABLET BY MOUTH TWICE A DAY BEFORE MEALS. (BREAKFAST AND SUPPER)   potassium chloride 10 MEQ tablet Commonly known as: K-DUR TAKE 1 TABLET BY MOUTH ONCE DAILY.   predniSONE 1 MG tablet Commonly known as: DELTASONE Take 1 mg by mouth daily with breakfast.   predniSONE 5 MG tablet Commonly known as: DELTASONE Take 1 tablet (5 mg total) by mouth daily with breakfast.   primidone 50 MG tablet Commonly known as: MYSOLINE TAKE ONE TABLET BY MOUTH AT BEDTIME.   PROBIOTIC PO Take 1 tablet by mouth at bedtime.   TART CHERRY ADVANCED PO Take by mouth daily.   torsemide 20 MG tablet Commonly known as: Demadex Take 3 tablets (60 mg total) by mouth 2 (two) times daily.   triamcinolone lotion 0.1 % Commonly known as: KENALOG Apply 1 application topically 3 (three) times daily. For up to 14 days and as needed   True Metrix Blood Glucose Test test strip Generic drug: glucose blood USE TO TEST BLOOD  SUGAR AS DIRECTED.   Vitamin A 2400 MCG (8000 UT) Caps Take 8,000 Units by mouth every morning.   warfarin 4 MG tablet Commonly known as: COUMADIN Take as directed. If you are unsure how to take this medication, talk to your nurse or doctor. Original instructions: TAKE 1 TABLET BY MOUTH ONCE DAILY EXCEPT ON THURSDAY TAKE 1/2 TABLET      Follow-up Information    [facility] Follow up.   Why: home health RN- they will call you to [facility] first home visit. Should hear from them within 48 hours of discharge from hospital Contact information: [address] [place] [phone]       Pixie Casino, MD Follow up.   Specialty: Cardiology Why: call for follow up appointment in 1 week Contact information: [place] [address] [phone]        Celene Squibb, MD Follow up on 01/28/2019.   Specialty: Internal Medicine Why: Please follow up with Faustino Congress, NP on Thursday, August 27th at 10:30am Contact information: [address] [place] [address] [phone]          Allergies  Allergen Reactions  . Xarelto [Rivaroxaban] Other (See Comments)    Internal bleeding  . Ancef [Cefazolin] Nausea And Vomiting  . Levaquin [Levofloxacin In D5w] Other (See Comments)    "afib"  . Lyrica [Pregabalin] Hives  . Tamiflu [Oseltamivir Phosphate] Other (See Comments)    "water blisters"  . Zoloft [Sertraline Hcl] Other (See Comments)    Jaw problems, jittery  . Augmentin [Amoxicillin-Pot Clavulanate] Itching  . Ciprofloxacin Itching and Nausea And Vomiting  . Haldol [Haloperidol] Other (See Comments)    Restless leg  . Nsaids Diarrhea  . Penicillins Itching, Nausea And Vomiting and Rash    Has patient had a PCN reaction causing immediate rash, facial/tongue/throat swelling, SOB or lightheadedness with hypotension: Yes Has patient had a PCN reaction causing severe rash involving mucus membranes  or skin necrosis: No Has patient had a PCN reaction that required hospitalization No Has patient had a PCN reaction occurring within the last 10 years: Yes If all of the above answers are "NO", then may proceed with Cephalosporin use.  . Topamax [Topiramate] Nausea Only    Consultations:     Procedures/Studies: Dg Chest 2 View  Result Date: 01/22/2019 CLINICAL DATA:  Worsening shortness of breath. EXAM: CHEST -  2 VIEW COMPARISON:  None. FINDINGS: Increasing infrahilar interstitial opacities with small bilateral effusions. Some mild peribronchovascular cuffing is noted centrally. Septal lines are present in the periphery. Fissural thickening is seen on the lateral radiograph. The heart is enlarged. Cardiomediastinal contours are otherwise unremarkable. An anterior cervical fusion with interbody spacer placement is noted. No acute osseous or soft tissue abnormality. Degenerative changes are present in the spine and shoulders. IMPRESSION: Findings suggesting CHF with cardiomegaly, edema and small effusions. Electronically Signed   By: Lovena Le M.D.   On: 01/22/2019 15:19       Subjective: Feels that breathing is improving.  No chest pain or cough.  Discharge Exam: Vitals:   01/25/19 6283 01/25/19 0616 01/25/19 0804 01/25/19 0909  BP: 114/74   (!) 115/92  Pulse: (!) 58 75  85  Resp: 18     Temp: 98 F (36.7 C)     TempSrc:      SpO2: 100%  98%   Weight: 89.2 kg     Height:        General: Pt is alert, awake, not in acute distress Cardiovascular: RRR, S1/S2 +, no rubs, no gallops Respiratory: CTA bilaterally, no wheezing, no rhonchi Abdominal: Soft, NT, ND, bowel sounds + Extremities: no edema, no cyanosis    The results of significant diagnostics from this hospitalization (including imaging, microbiology, ancillary and laboratory) are listed below for reference.     Microbiology: Recent Results (from the past 240 hour(s))  SARS Coronavirus 2 [facility] order,  Performed in [facility] hospital lab) Nasopharyngeal Nasopharyngeal Swab     Status: None   Collection Time: 01/22/19  9:11 PM   Specimen: Nasopharyngeal Swab  Result Value Ref Range Status   SARS Coronavirus 2 NEGATIVE NEGATIVE Final    Comment: (NOTE) If result is NEGATIVE SARS-CoV-2 target nucleic acids are NOT DETECTED. The SARS-CoV-2 RNA is generally detectable in upper and lower  respiratory specimens during the acute phase of infection. The lowest  concentration of SARS-CoV-2 viral copies this assay can detect is 250  copies / mL. A negative result does not preclude SARS-CoV-2 infection  and should not be used as the sole basis for treatment or other  patient management decisions.  A negative result may occur with  improper specimen collection / handling, submission of specimen other  than nasopharyngeal swab, presence of viral mutation(s) within the  areas targeted by this assay, and inadequate number of viral copies  (<250 copies / mL). A negative result must be combined with clinical  observations, patient history, and epidemiological information. If result is POSITIVE SARS-CoV-2 target nucleic acids are DETECTED. The SARS-CoV-2 RNA is generally detectable in upper and lower  respiratory specimens dur ing the acute phase of infection.  Positive  results are indicative of active infection with SARS-CoV-2.  Clinical  correlation with patient history and other diagnostic information is  necessary to determine patient infection status.  Positive results do  not rule out bacterial infection or co-infection with other viruses. If result is PRESUMPTIVE POSTIVE SARS-CoV-2 nucleic acids MAY BE PRESENT.   A presumptive positive result was obtained on the submitted specimen  and confirmed on repeat testing.  While 2019 novel coronavirus  (SARS-CoV-2) nucleic acids may be present in the submitted sample  additional confirmatory testing may be necessary for epidemiological  and / or  clinical management purposes  to differentiate between  SARS-CoV-2 and other Sarbecovirus currently known to infect humans.  If clinically indicated additional testing with an  alternate test  methodology [phone]) is advised. The SARS-CoV-2 RNA is generally  detectable in upper and lower respiratory sp ecimens during the acute  phase of infection. The expected result is Negative. Fact Sheet for Patients:  StrictlyIdeas.no Fact Sheet for Healthcare Providers: BankingDealers.co.za This test is not yet approved or cleared by the [place] FDA and has been authorized for detection and/or diagnosis of SARS-CoV-2 by FDA under an Emergency Use Authorization (EUA).  This EUA will remain in effect (meaning this test can be used) for the duration of the COVID-19 declaration under Section 564(b)(1) of the Act, 21 U.S.C. section 360bbb-3(b)(1), unless the authorization is terminated or revoked sooner. Performed at [facility], [address], [place], [place] [address]      Labs: BNP (last 3 results) Recent Labs    01/22/19 1824  BNP 811.9*   Basic Metabolic Panel: Recent Labs  Lab 01/22/19 1822 01/23/19 2226 01/24/19 0647 01/24/19 2359 01/25/19 0452  NA 138 141 145  --  142  K 3.1* 3.1* 2.7* 3.6 3.4*  CL 91* 91* 94*  --  96*  CO2 33* 34* 37*  --  34*  GLUCOSE 190* 276* 108*  --  161*  BUN 37* 34* 33*  --  35*  CREATININE 1.12* 1.15* 1.19*  --  1.29*  CALCIUM 7.8* 8.2* 8.2*  --  8.0*  MG  --  1.2*  --   --  1.8   Liver Function Tests: Recent Labs  Lab 01/24/19 0647  AST 18  ALT 17  ALKPHOS 92  BILITOT 0.4  PROT 6.7  ALBUMIN 3.5   No results for input(s): LIPASE, AMYLASE in the last 168 hours. No results for input(s): AMMONIA in the last 168 hours. CBC: Recent Labs  Lab 01/22/19 1822 01/23/19 2226 01/24/19 0647  WBC 12.7* 12.4* 9.2  HGB 10.7* 11.3* 11.0*  HCT 35.0* 37.2 36.4  MCV 94.3 96.1 95.0  PLT 206 235 194    Cardiac Enzymes: No results for input(s): CKTOTAL, CKMB, CKMBINDEX, TROPONINI in the last 168 hours. BNP: Invalid input(s): POCBNP CBG: Recent Labs  Lab 01/24/19 1150 01/24/19 1704 01/24/19 2101 01/25/19 0714 01/25/19 1105  GLUCAP 168* 235* 240* 91 195*   D-Dimer No results for input(s): DDIMER in the last 72 hours. Hgb A1c No results for input(s): HGBA1C in the last 72 hours. Lipid Profile No results for input(s): CHOL, HDL, LDLCALC, TRIG, CHOLHDL, LDLDIRECT in the last 72 hours. Thyroid function studies No results for input(s): TSH, T4TOTAL, T3FREE, THYROIDAB in the last 72 hours.  Invalid input(s): FREET3 Anemia work up No results for input(s): VITAMINB12, FOLATE, FERRITIN, TIBC, IRON, RETICCTPCT in the last 72 hours. Urinalysis    Component Value Date/Time   COLORURINE YELLOW 09/07/2016 1413   APPEARANCEUR HAZY (A) 09/07/2016 1413   LABSPEC 1.008 09/07/2016 1413   PHURINE 7.0 09/07/2016 1413   GLUCOSEU NEGATIVE 09/07/2016 1413   HGBUR NEGATIVE 09/07/2016 1413   BILIRUBINUR NEGATIVE 09/07/2016 1413   KETONESUR NEGATIVE 09/07/2016 1413   PROTEINUR 30 (A) 09/07/2016 1413   UROBILINOGEN 1.0 02/20/2015 1855   NITRITE NEGATIVE 09/07/2016 1413   LEUKOCYTESUR MODERATE (A) 09/07/2016 1413   Sepsis Labs Invalid input(s): PROCALCITONIN,  WBC,  LACTICIDVEN Microbiology Recent Results (from the past 240 hour(s))  SARS Coronavirus 2 [facility] order, Performed in [facility] hospital lab) Nasopharyngeal Nasopharyngeal Swab     Status: None   Collection Time: 01/22/19  9:11 PM   Specimen: Nasopharyngeal Swab  Result Value Ref Range Status  SARS Coronavirus 2 NEGATIVE NEGATIVE Final    Comment: (NOTE) If result is NEGATIVE SARS-CoV-2 target nucleic acids are NOT DETECTED. The SARS-CoV-2 RNA is generally detectable in upper and lower  respiratory specimens during the acute phase of infection. The lowest  concentration of SARS-CoV-2 viral copies this assay can detect is  250  copies / mL. A negative result does not preclude SARS-CoV-2 infection  and should not be used as the sole basis for treatment or other  patient management decisions.  A negative result may occur with  improper specimen collection / handling, submission of specimen other  than nasopharyngeal swab, presence of viral mutation(s) within the  areas targeted by this assay, and inadequate number of viral copies  (<250 copies / mL). A negative result must be combined with clinical  observations, patient history, and epidemiological information. If result is POSITIVE SARS-CoV-2 target nucleic acids are DETECTED. The SARS-CoV-2 RNA is generally detectable in upper and lower  respiratory specimens dur ing the acute phase of infection.  Positive  results are indicative of active infection with SARS-CoV-2.  Clinical  correlation with patient history and other diagnostic information is  necessary to determine patient infection status.  Positive results do  not rule out bacterial infection or co-infection with other viruses. If result is PRESUMPTIVE POSTIVE SARS-CoV-2 nucleic acids MAY BE PRESENT.   A presumptive positive result was obtained on the submitted specimen  and confirmed on repeat testing.  While 2019 novel coronavirus  (SARS-CoV-2) nucleic acids may be present in the submitted sample  additional confirmatory testing may be necessary for epidemiological  and / or clinical management purposes  to differentiate between  SARS-CoV-2 and other Sarbecovirus currently known to infect humans.  If clinically indicated additional testing with an alternate test  methodology [phone]) is advised. The SARS-CoV-2 RNA is generally  detectable in upper and lower respiratory sp ecimens during the acute  phase of infection. The expected result is Negative. Fact Sheet for Patients:  StrictlyIdeas.no Fact Sheet for Healthcare  Providers: BankingDealers.co.za This test is not yet approved or cleared by the [place] FDA and has been authorized for detection and/or diagnosis of SARS-CoV-2 by FDA under an Emergency Use Authorization (EUA).  This EUA will remain in effect (meaning this test can be used) for the duration of the COVID-19 declaration under Section 564(b)(1) of the Act, 21 U.S.C. section 360bbb-3(b)(1), unless the authorization is terminated or revoked sooner. Performed at [facility], [address], [place], [place] [address]      Time coordinating discharge: 81mins  SIGNED:   Kathie Dike, MD  [facility] 01/25/2019, 6:32 PM   If 7PM-7AM, please contact night-coverage [email or web address]

## 2019-01-26 ENCOUNTER — Other Ambulatory Visit: Payer: Self-pay

## 2019-01-26 DIAGNOSIS — E559 Vitamin D deficiency, unspecified: Secondary | ICD-10-CM | POA: Diagnosis not present

## 2019-01-26 DIAGNOSIS — K5900 Constipation, unspecified: Secondary | ICD-10-CM | POA: Diagnosis not present

## 2019-01-26 DIAGNOSIS — E782 Mixed hyperlipidemia: Secondary | ICD-10-CM | POA: Diagnosis not present

## 2019-01-26 DIAGNOSIS — I4891 Unspecified atrial fibrillation: Secondary | ICD-10-CM | POA: Diagnosis not present

## 2019-01-26 DIAGNOSIS — M797 Fibromyalgia: Secondary | ICD-10-CM | POA: Diagnosis not present

## 2019-01-26 DIAGNOSIS — R251 Tremor, unspecified: Secondary | ICD-10-CM | POA: Diagnosis not present

## 2019-01-26 NOTE — Patient Outreach (Signed)
[facility]) Care Management  01/26/2019  Tiari Andringa [facility] Nov 10, 1950 [id number]   Referral received from [facility]. Per chart review, Ms. Gagen was admitted on 01/22/19 for Acute on chronic congestive heart failure. She was discharged on 01/25/19.  Initial outreach unsuccessful. Left HIPAA compliant voice message requesting a return call. Will send unsuccessful outreach letter and follow-up within 3-4 business days.  [facility] Care Management [phone]

## 2019-01-26 NOTE — Patient Outreach (Signed)
[facility]) Care Management  01/26/2019  Jinelle Butchko [facility] [birth year] [id number]    [facility] notified today that the patient was admitted in the hospital on 01/22/19 to 01/25/19 for Acute on Chronic Congestive Heart Failure.    RN Health Coach will close the program due to admission and send referral to complex case management for TOC.  Will notify physician of case closure.  [name] RN, BSN, [place] Direct Dial:  [phone]  Fax: [phone]

## 2019-01-28 DIAGNOSIS — E876 Hypokalemia: Secondary | ICD-10-CM | POA: Diagnosis not present

## 2019-01-28 DIAGNOSIS — M6281 Muscle weakness (generalized): Secondary | ICD-10-CM | POA: Diagnosis not present

## 2019-01-28 DIAGNOSIS — I517 Cardiomegaly: Secondary | ICD-10-CM | POA: Diagnosis not present

## 2019-01-28 DIAGNOSIS — R0602 Shortness of breath: Secondary | ICD-10-CM | POA: Diagnosis not present

## 2019-01-28 DIAGNOSIS — I5033 Acute on chronic diastolic (congestive) heart failure: Secondary | ICD-10-CM | POA: Diagnosis not present

## 2019-01-28 DIAGNOSIS — Z9981 Dependence on supplemental oxygen: Secondary | ICD-10-CM | POA: Diagnosis not present

## 2019-01-28 DIAGNOSIS — D509 Iron deficiency anemia, unspecified: Secondary | ICD-10-CM | POA: Diagnosis not present

## 2019-01-28 DIAGNOSIS — I482 Chronic atrial fibrillation, unspecified: Secondary | ICD-10-CM | POA: Diagnosis not present

## 2019-01-29 ENCOUNTER — Ambulatory Visit: Payer: Self-pay

## 2019-01-29 DIAGNOSIS — E1159 Type 2 diabetes mellitus with other circulatory complications: Secondary | ICD-10-CM | POA: Diagnosis not present

## 2019-01-29 DIAGNOSIS — I5043 Acute on chronic combined systolic (congestive) and diastolic (congestive) heart failure: Secondary | ICD-10-CM | POA: Diagnosis not present

## 2019-01-29 DIAGNOSIS — Z794 Long term (current) use of insulin: Secondary | ICD-10-CM | POA: Diagnosis not present

## 2019-01-29 DIAGNOSIS — M1990 Unspecified osteoarthritis, unspecified site: Secondary | ICD-10-CM | POA: Diagnosis not present

## 2019-01-29 DIAGNOSIS — I130 Hypertensive heart and chronic kidney disease with heart failure and stage 1 through stage 4 chronic kidney disease, or unspecified chronic kidney disease: Secondary | ICD-10-CM | POA: Diagnosis not present

## 2019-01-29 DIAGNOSIS — J9611 Chronic respiratory failure with hypoxia: Secondary | ICD-10-CM | POA: Diagnosis not present

## 2019-01-29 DIAGNOSIS — Z87891 Personal history of nicotine dependence: Secondary | ICD-10-CM | POA: Diagnosis not present

## 2019-01-29 DIAGNOSIS — N183 Chronic kidney disease, stage 3 (moderate): Secondary | ICD-10-CM | POA: Diagnosis not present

## 2019-01-29 DIAGNOSIS — H542X11 Low vision right eye category 1, low vision left eye category 1: Secondary | ICD-10-CM | POA: Diagnosis not present

## 2019-01-29 DIAGNOSIS — F419 Anxiety disorder, unspecified: Secondary | ICD-10-CM | POA: Diagnosis not present

## 2019-01-29 DIAGNOSIS — I4891 Unspecified atrial fibrillation: Secondary | ICD-10-CM | POA: Diagnosis not present

## 2019-01-29 DIAGNOSIS — F319 Bipolar disorder, unspecified: Secondary | ICD-10-CM | POA: Diagnosis not present

## 2019-01-29 DIAGNOSIS — E1122 Type 2 diabetes mellitus with diabetic chronic kidney disease: Secondary | ICD-10-CM | POA: Diagnosis not present

## 2019-01-29 DIAGNOSIS — Z7901 Long term (current) use of anticoagulants: Secondary | ICD-10-CM | POA: Diagnosis not present

## 2019-01-29 DIAGNOSIS — E785 Hyperlipidemia, unspecified: Secondary | ICD-10-CM | POA: Diagnosis not present

## 2019-01-29 DIAGNOSIS — J449 Chronic obstructive pulmonary disease, unspecified: Secondary | ICD-10-CM | POA: Diagnosis not present

## 2019-01-29 DIAGNOSIS — E1140 Type 2 diabetes mellitus with diabetic neuropathy, unspecified: Secondary | ICD-10-CM | POA: Diagnosis not present

## 2019-01-29 DIAGNOSIS — G4733 Obstructive sleep apnea (adult) (pediatric): Secondary | ICD-10-CM | POA: Diagnosis not present

## 2019-01-29 DIAGNOSIS — I428 Other cardiomyopathies: Secondary | ICD-10-CM | POA: Diagnosis not present

## 2019-01-29 DIAGNOSIS — J9612 Chronic respiratory failure with hypercapnia: Secondary | ICD-10-CM | POA: Diagnosis not present

## 2019-01-29 DIAGNOSIS — K219 Gastro-esophageal reflux disease without esophagitis: Secondary | ICD-10-CM | POA: Diagnosis not present

## 2019-01-29 DIAGNOSIS — D649 Anemia, unspecified: Secondary | ICD-10-CM | POA: Diagnosis not present

## 2019-01-29 DIAGNOSIS — M797 Fibromyalgia: Secondary | ICD-10-CM | POA: Diagnosis not present

## 2019-01-29 DIAGNOSIS — Z8701 Personal history of pneumonia (recurrent): Secondary | ICD-10-CM | POA: Diagnosis not present

## 2019-01-29 DIAGNOSIS — M1030 Gout due to renal impairment, unspecified site: Secondary | ICD-10-CM | POA: Diagnosis not present

## 2019-02-01 ENCOUNTER — Other Ambulatory Visit: Payer: Self-pay

## 2019-02-01 NOTE — Patient Outreach (Signed)
[facility]) Care Management  02/01/2019  Denise Freeman [facility] April 09, 1951 [id number]    2nd unsuccessful outreach attempt. Will attempt outreach within 3-4 business days.     [facility] Care Management [phone]

## 2019-02-02 DIAGNOSIS — N183 Chronic kidney disease, stage 3 (moderate): Secondary | ICD-10-CM | POA: Diagnosis not present

## 2019-02-02 DIAGNOSIS — J9612 Chronic respiratory failure with hypercapnia: Secondary | ICD-10-CM | POA: Diagnosis not present

## 2019-02-02 DIAGNOSIS — J9611 Chronic respiratory failure with hypoxia: Secondary | ICD-10-CM | POA: Diagnosis not present

## 2019-02-02 DIAGNOSIS — I130 Hypertensive heart and chronic kidney disease with heart failure and stage 1 through stage 4 chronic kidney disease, or unspecified chronic kidney disease: Secondary | ICD-10-CM | POA: Diagnosis not present

## 2019-02-02 DIAGNOSIS — E1122 Type 2 diabetes mellitus with diabetic chronic kidney disease: Secondary | ICD-10-CM | POA: Diagnosis not present

## 2019-02-02 DIAGNOSIS — I5043 Acute on chronic combined systolic (congestive) and diastolic (congestive) heart failure: Secondary | ICD-10-CM | POA: Diagnosis not present

## 2019-02-03 ENCOUNTER — Telehealth (INDEPENDENT_AMBULATORY_CARE_PROVIDER_SITE_OTHER): Payer: Medicare Other | Admitting: Internal Medicine

## 2019-02-03 ENCOUNTER — Encounter: Payer: Self-pay | Admitting: Internal Medicine

## 2019-02-03 ENCOUNTER — Telehealth: Payer: Self-pay | Admitting: Internal Medicine

## 2019-02-03 VITALS — BP 136/58 | HR 98 | Wt 201.0 lb

## 2019-02-03 DIAGNOSIS — N183 Chronic kidney disease, stage 3 unspecified: Secondary | ICD-10-CM

## 2019-02-03 DIAGNOSIS — I5023 Acute on chronic systolic (congestive) heart failure: Secondary | ICD-10-CM

## 2019-02-03 DIAGNOSIS — E119 Type 2 diabetes mellitus without complications: Secondary | ICD-10-CM | POA: Diagnosis not present

## 2019-02-03 DIAGNOSIS — J438 Other emphysema: Secondary | ICD-10-CM

## 2019-02-03 DIAGNOSIS — I4819 Other persistent atrial fibrillation: Secondary | ICD-10-CM

## 2019-02-03 DIAGNOSIS — I480 Paroxysmal atrial fibrillation: Secondary | ICD-10-CM

## 2019-02-03 DIAGNOSIS — Z79899 Other long term (current) drug therapy: Secondary | ICD-10-CM

## 2019-02-03 NOTE — Progress Notes (Signed)
Virtual Visit via Telephone Note   This visit type was conducted due to national recommendations for restrictions regarding the COVID-19 Pandemic (e.g. social distancing) in an effort to limit this patient's exposure and mitigate transmission in our community.  Due to her co-morbid illnesses, this patient is at least at moderate risk for complications without adequate follow up.  This format is felt to be most appropriate for this patient at this time.  The patient did not have access to video technology/had technical difficulties with video requiring transitioning to audio format only (telephone).  All issues noted in this document were discussed and addressed.  No physical exam could be performed with this format.  Please refer to the patient's chart for her  consent to telehealth for [facility].   Evaluation Performed:  Telephone follow-up  Date:  02/03/2019   ID:  Denise Freeman, DOB [birth year], MRN [id number]  Patient Location:  [place] [address]  Provider location:   [address], [place], [place] [address]  PCP:  Celene Squibb, MD  Cardiologist:  Pixie Casino, MD Electrophysiologist:  None   Chief Complaint:  Palpitations, sharp chest pain  History of Present Illness:    Denise Freeman is a 68 y.o. female who presents via audio/video conferencing for a telehealth visit today.  Refill is seen today via telephone visit for symptoms of left-sided chest pain and palpitations.  Recently she has been struggling with recurrent skin infections.  She was placed on steroids and clindamycin.  She does report some improvement in her skin blisters, but has had some palpitations and tachycardia.  She has permanent A. fib and her rate appears to be well controlled today.  With regards her chest pain, this is left-sided, sharp and intermittent, worse somewhat with movement but not necessarily exertion.  She has no known coronary disease and has had multiple cardiac  catheterizations over the past 20 years demonstrated no significant obstructive coronary disease.  She has a nonischemic cardiomyopathy with EF 30 to 35% and is on high-dose diuretics, Lasix 80 mg 3 times daily and metolazone 2.5 mg daily.  She does appear to be euvolemic on this dose in fact her weight is actually 4 pounds lighter than it was in February.  10/14/2018  Denise Freeman is seen today for follow-up.  She reports that overall she is feeling better.  Her chest pain has improved.  She is now off of steroids.  Her heart rate is improved as well.  She was on treatment for gout however she said she had some difficulty with capsules rather than tablets for colchicine.  She remains on an antibiotic.  Her weight is been stable.  Her blood pressure still remains elevated despite restarting her losartan 12.5 mg daily.  02/03/2019  Denise Freeman is seen today for hospital follow-up via telephone visit.  She was recently admitted to [facility] on August 21 and discharged on August 24.  This was for acute on chronic congestive heart failure complicated by COPD and chronic kidney disease.  She was noted to be volume overloaded and diuresed.  She responded very well to IV Lasix and diuresed fairly quickly.  Her case was discussed with Dr. Bronson Ing, who recommended changing her Lasix to torsemide 60 mg twice daily and increasing metolazone from 2.5 to 5 mg daily.  Since then she has been fairly stable.  Weight does appear to be up about 3 pounds since discharge.  She has  a home health nurse who has been following with her and says that she feels that she has no signs of volume overload.  Denise Freeman says that her breathing is back to her baseline.  The patient does not have symptoms concerning for COVID-19 infection (fever, chills, cough, or new SHORTNESS OF BREATH).    Prior CV studies:   The following studies were reviewed today:  Labs Chart review of recent hospitalization  PMHx:  Past Medical  History:  Diagnosis Date  . Allergy   . Anemia   . Anxiety   . Asthma   . Atrial fibrillation ([place])   . Bipolar 1 disorder ([place])   . 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 Cholelithiasis    a. 09/2016 s/p Lap Chole.  . Chronic bronchitis ([place])   . Chronic combined systolic and diastolic congestive heart failure ([place])    a. 09/2016 Echo: EF 30-35%.  . Colon polyps   . COPD (chronic obstructive pulmonary disease) ([place])   . Depression   . Family history of adverse reaction to anesthesia    Uncle was positive for malignant hyperthermia; patient had testing done and was negative.  . Fibromyalgia   . GERD (gastroesophageal reflux disease)   . Gout   . High cholesterol   . History of blood transfusion 06/2015   "bleeding from my rectum"  . History of hiatal hernia   . HOH (hard of hearing)   . Hx of colonic polyps 03/21/2016   3 small adenomas no recall - co-morbidities  . Neuropathy    Disc Back   . NICM (nonischemic cardiomyopathy) ([place])    a. Previously worked up in [place], [place]-->low EF with subsequent recovery.  Multiple caths (last ~ 2014 per pt report)--reportedly nl cors;  b. 09/2016 Echo: EF 30-35%, antsept/apical HK, mild MR, mildly dil LA, mod dil RA;  c. 09/2016 Lexi MV: EF 26%, glob HK, sept DK, med size, mod intensity fixed septal defect - BBB/PVC related artifact, no ischemia.  . On home oxygen therapy    "3L; 24/7" (03/14/2016)  . OSA treated with BiPAP    uses biPAP, 10 (03/14/2016)  . Osteoarthritis   . Oxygen deficiency   . Pneumonia   . Type II diabetes mellitus ([place])     Past Surgical History:  Procedure Laterality Date  . APPENDECTOMY     "they busted"  . bladder stimulator     pt states, "it cannot be turned off; it's in my right hip; dead battery so it's not working anymore". (03/14/2016)  . BLADDER SUSPENSION     2003, 2006 and 2010  . CATARACT EXTRACTION W/PHACO Right 11/29/2014   Procedure: CATARACT EXTRACTION PHACO AND INTRAOCULAR LENS  PLACEMENT (IOC);  Surgeon: Rutherford Guys, MD;  Location: [facility];  Service: Ophthalmology;  Laterality: Right;  CDE:3.81  . CATARACT EXTRACTION W/PHACO Left 12/13/2014   Procedure: CATARACT EXTRACTION PHACO AND INTRAOCULAR LENS PLACEMENT (IOC);  Surgeon: Rutherford Guys, MD;  Location: [facility];  Service: Ophthalmology;  Laterality: Left;  CDE:6.59  . CERVICAL DISC SURGERY N/A 2009   4, 6, and 7 cervical disc replaced  . CHOLECYSTECTOMY N/A 09/13/2016   Procedure: LAPAROSCOPIC CHOLECYSTECTOMY;  Surgeon: Rolm Bookbinder, MD;  Location: [facility];  Service: General;  Laterality: N/A;  . COLONOSCOPY WITH PROPOFOL N/A 03/15/2016   Procedure: COLONOSCOPY WITH PROPOFOL;  Surgeon: Gatha Mayer, MD;  Location: [facility];  Service: Endoscopy;  Laterality: N/A;  . ESOPHAGOGASTRODUODENOSCOPY (EGD) WITH PROPOFOL N/A  03/15/2016   Procedure: ESOPHAGOGASTRODUODENOSCOPY (EGD) WITH PROPOFOL;  Surgeon: Gatha Mayer, MD;  Location: [place] [facility];  Service: Endoscopy;  Laterality: N/A;  . HEEL SPUR SURGERY Bilateral   . HERNIA REPAIR    . I&D EXTREMITY Right 06/13/2015   Procedure: MINOR IRRIGATION AND DEBRIDEMENT EXTREMITY REMOVAL OF NAIL;  Surgeon: Daryll Brod, MD;  Location: [facility];  Service: Orthopedics;  Laterality: Right;  . TUBAL LIGATION    . UMBILICAL HERNIA REPAIR     w/mesh    FAMHx:  Family History  Problem Relation Age of Onset  . Heart disease Mother   . COPD Mother   . Diabetes Mother   . Breast cancer Mother   . Heart disease Father   . Hyperlipidemia Father   . COPD Sister   . Heart disease Sister   . Diabetes Sister   . Heart disease Maternal Grandmother   . Cancer Maternal Grandmother        stomach  . Cancer Maternal Grandfather        lung   . Bipolar disorder Brother     SOCHx:   reports that she quit smoking about 29 years ago. Her smoking use included cigarettes. She has a 28.00 pack-year smoking history. She has never used smokeless tobacco. She reports that  she does not drink alcohol or use drugs.  ALLERGIES:  Allergies  Allergen Reactions  . Xarelto [Rivaroxaban] Other (See Comments)    Internal bleeding  . Ancef [Cefazolin] Nausea And Vomiting  . Levaquin [Levofloxacin In D5w] Other (See Comments)    "afib"  . Lyrica [Pregabalin] Hives  . Tamiflu [Oseltamivir Phosphate] Other (See Comments)    "water blisters"  . Zoloft [Sertraline Hcl] Other (See Comments)    Jaw problems, jittery  . Augmentin [Amoxicillin-Pot Clavulanate] Itching  . Ciprofloxacin Itching and Nausea And Vomiting  . Haldol [Haloperidol] Other (See Comments)    Restless leg  . Nsaids Diarrhea  . Penicillins Itching, Nausea And Vomiting and Rash    Has patient had a PCN reaction causing immediate rash, facial/tongue/throat swelling, SOB or lightheadedness with hypotension: Yes Has patient had a PCN reaction causing severe rash involving mucus membranes or skin necrosis: No Has patient had a PCN reaction that required hospitalization No Has patient had a PCN reaction occurring within the last 10 years: Yes If all of the above answers are "NO", then may proceed with Cephalosporin use.  . Topamax [Topiramate] Nausea Only    MEDS:  Current Meds  Medication Sig  . albuterol (PROVENTIL) (2.5 MG/3ML) 0.083% nebulizer solution USE (1) IN NEBULIZER EVERY 6 HOURS AS NEEDED FOR SHORTNESS OF BREATH.  Marland Kitchen allopurinol (ZYLOPRIM) 100 MG tablet TAKE 1 TABLET BY MOUTH ONCE DAILY.  . budesonide (PULMICORT) 0.5 MG/2ML nebulizer solution Take 0.5 mg by nebulization 2 (two) times daily.  . cetirizine (ZYRTEC) 10 MG tablet Take 1 tablet (10 mg total) by mouth at bedtime.  . cholecalciferol (VITAMIN D) 400 units TABS tablet Take 1 tablet (400 Units total) by mouth 2 (two) times daily.  . colchicine 0.6 MG tablet Take 1 tablet (0.6 mg total) by mouth as needed.  . docusate sodium (COLACE) 100 MG capsule Take 100 mg by mouth daily.  Marland Kitchen escitalopram (LEXAPRO) 10 MG tablet Take 1 tablet (10  mg total) by mouth at bedtime.  . fenofibrate 54 MG tablet Take 54 mg by mouth daily.  . FEROSUL 325 (65 Fe) MG tablet Take 1 tablet by mouth daily.  Marland Kitchen  fluticasone (FLONASE) 50 MCG/ACT nasal spray Place 1 spray into both nostrils 2 (two) times daily.  . Insulin Glargine (BASAGLAR KWIKPEN) 100 UNIT/ML SOPN Inject 0.6 mLs (60 Units total) into the skin at bedtime.  . insulin lispro (HUMALOG KWIKPEN) 100 UNIT/ML KiwkPen Inject 0.1-0.16 mLs (10-16 Units total) into the skin 3 (three) times daily before meals.  . Insulin Pen Needle 32G X 4 MM MISC Use to inject insulin 5 times daily  . ipratropium (ATROVENT) 0.02 % nebulizer solution USE (1) IN NEBULIZER EVERY 6 HOURS AS NEEDED FOR SHORTNESS OF BREATH.  Marland Kitchen losartan (COZAAR) 25 MG tablet Take 1 tablet (25 mg total) by mouth daily.  . metolazone (ZAROXOLYN) 5 MG tablet TAKE 1 TABLET BY MOUTH ONCE DAILY AS NEEDED FOR EDEMA. * TAKE 30 MINUTES BEFORE torsemide*  . metoprolol succinate (TOPROL-XL) 25 MG 24 hr tablet Take 1 tablet (25 mg total) by mouth 2 (two) times daily.  . mirtazapine (REMERON) 15 MG tablet Take 1 tablet (15 mg total) by mouth at bedtime.  . Misc Natural Products (TART CHERRY ADVANCED PO) Take by mouth daily.  . modafinil (PROVIGIL) 200 MG tablet Take 1 tablet (200 mg total) by mouth daily.  . mometasone (NASONEX) 50 MCG/ACT nasal spray Place 1 spray into the nose daily.  . montelukast (SINGULAIR) 10 MG tablet TAKE ONE TABLET BY MOUTH AT BEDTIME.  . mupirocin cream (BACTROBAN) 2 % Apply 1 application topically 2 (two) times daily.  . nitroGLYCERIN (NITROSTAT) 0.4 MG SL tablet Place 0.4 mg under the tongue every 5 (five) minutes as needed for chest pain.  Marland Kitchen nystatin (NYSTATIN) powder Apply topically 2 (two) times daily. Apply under breasts  . ondansetron (ZOFRAN) 4 MG tablet Take 4 mg by mouth once.  Marland Kitchen oxyCODONE-acetaminophen (PERCOCET/ROXICET) 5-325 MG tablet Take 1 tablet by mouth 2 (two) times daily as needed for severe pain.  .  pantoprazole (PROTONIX) 20 MG tablet TAKE (1) TABLET BY MOUTH TWICE A DAY BEFORE MEALS. (BREAKFAST AND SUPPER)  . potassium chloride (K-DUR) 10 MEQ tablet TAKE 1 TABLET BY MOUTH ONCE DAILY.  Marland Kitchen predniSONE (DELTASONE) 1 MG tablet Take 1 mg by mouth daily with breakfast.  . predniSONE (DELTASONE) 5 MG tablet Take 1 tablet (5 mg total) by mouth daily with breakfast.  . primidone (MYSOLINE) 50 MG tablet TAKE ONE TABLET BY MOUTH AT BEDTIME.  . Probiotic Product (PROBIOTIC PO) Take 1 tablet by mouth at bedtime.  . torsemide (DEMADEX) 20 MG tablet Take 3 tablets (60 mg total) by mouth 2 (two) times daily.  Marland Kitchen triamcinolone lotion (KENALOG) 0.1 % Apply 1 application topically 3 (three) times daily. For up to 14 days and as needed  . TRUE METRIX BLOOD GLUCOSE TEST test strip USE TO TEST BLOOD SUGAR AS DIRECTED.  . Vitamin A 2400 MCG (8000 UT) CAPS Take 8,000 Units by mouth every morning.   . warfarin (COUMADIN) 4 MG tablet TAKE 1 TABLET BY MOUTH ONCE DAILY EXCEPT ON THURSDAY TAKE 1/2 TABLET     ROS: Pertinent items noted in HPI and remainder of comprehensive ROS otherwise negative.  Labs/Other Tests and Data Reviewed:    Recent Labs: 01/22/2019: B Natriuretic Peptide 190.0 01/24/2019: ALT 17; Hemoglobin 11.0; Platelets 194 01/25/2019: BUN 35; Creatinine, Ser 1.29; Magnesium 1.8; Potassium 3.4; Sodium 142   Recent Lipid Panel Lab Results  Component Value Date/Time   CHOL 163 07/29/2017 11:15 AM   TRIG 183 (H) 07/29/2017 11:15 AM   HDL 40 07/29/2017 11:15 AM   CHOLHDL  4.1 07/29/2017 11:15 AM   CHOLHDL 6.2 09/10/2016 11:47 AM   LDLCALC 86 07/29/2017 11:15 AM   LDLDIRECT 153.0 04/19/2016 09:32 AM    Wt Readings from Last 3 Encounters:  02/03/19 201 lb (91.2 kg)  01/25/19 196 lb 10.4 oz (89.2 kg)  10/14/18 176 lb (79.8 kg)     Exam:    Vital Signs:  BP (!) 136/58   Pulse 98   Wt 201 lb (91.2 kg)   BMI 28.03 kg/m    exam not performed due to telephone visit  ASSESSMENT & PLAN:    1.  Acute on Chronic combined congestive heart failure - EF 30-35% 2. Permanent atrial fibrillation - not on anticoagulation due to recent GI bleeding and vision loss attributed to stroke 3. History of stress-induced cardiomyopathy 4. No significant obstructive coronary disease after multiple catheterizations in 1996, 99, 2007 and 2014. 5. Fibromyalgia 6. Bipolar 1 disorder 7. Dyslipidemia 8. Hypertension 9. Severe COPD on home oxygen 10. Obstructive sleep apnea on CPAP 11. Morbid obesity - with weight loss recently 12. DNR  Denise Freeman was just admitted to [facility] with acute on chronic systolic congestive heart failure and required IV diuresis but responded quickly.  She was switched to torsemide at a higher dose of 60 mg twice daily and her metolazone was increased to 5 mg daily.  Her weight is up a few pounds since discharge however she does not feel volume overloaded.  She has been seen by home health who feels like she remains euvolemic.  She will need close follow-up and will plan a repeat office visit in about 3 months.  Follow-up with me in 3 months.  COVID-19 Education: The signs and symptoms of COVID-19 were discussed with the patient and how to seek care for testing (follow up with PCP or arrange E-visit).  The importance of social distancing was discussed today.  Patient Risk:   After full review of this patients clinical status, I feel that they are at least moderate risk at this time.  Time:   Today, I have spent 25 minutes with the patient with telehealth technology discussing sharp chest pain, palpitations, skin infection, antibiotics, steroid use.     Medication Adjustments/Labs and Tests Ordered: Current medicines are reviewed at length with the patient today.  Concerns regarding medicines are outlined above.   Tests Ordered: No orders of the defined types were placed in this encounter.   Medication Changes: No orders of the defined types were placed in this encounter.    Disposition:  in 3 month(s)  Pixie Casino, MD, [facility], [place] Director of the Advanced Lipid Disorders &  Cardiovascular Risk Reduction Clinic Diplomate of the American Board of Clinical Lipidology Attending Cardiologist  Direct Dial: [phone]  Fax: [phone]  Website:  [email or web address]  Pixie Casino, MD  02/03/2019 9:27 AM

## 2019-02-03 NOTE — Patient Instructions (Signed)
Medication Instructions:  No changes If you need a refill on your cardiac medications before your next appointment, please call your pharmacy.    Follow-Up: At [facility], you and your health needs are our priority.  As part of our continuing mission to provide you with exceptional heart care, we have created designated Provider Care Teams.  These Care Teams include your primary Cardiologist (physician) and Advanced Practice Providers (APPs -  Physician Assistants and Nurse Practitioners) who all work together to provide you with the care you need, when you need it. You will need a follow up appointment in 3 months. You may see Pixie Casino, MD or one of the following Advanced Practice Providers on your designated Care Team: [place], [place] . Fabian Sharp, PA-C  Any Other Special Instructions Will Be Listed Below (If Applicable).

## 2019-02-03 NOTE — Telephone Encounter (Signed)
LMTCB on only number listed.

## 2019-02-03 NOTE — Telephone Encounter (Signed)
New Message:   Pt said she had a Virtual Visit with Dr Debara Pickett this morning. She said he asked her if she was retaining fluid and she said no. When she got up out of bed, she realized her leg was tight and swollen.

## 2019-02-04 NOTE — Telephone Encounter (Signed)
Follow Up:; ° ° °Returning your call. °

## 2019-02-04 NOTE — Telephone Encounter (Signed)
Increase torsemide to 4 tablets (80 mg) BID - continue same dose of metolazone. Monitor weight. Will need follow-up with me or APP in 2-3 weeks with BMET prior.  Dr Lemmie Evens

## 2019-02-04 NOTE — Telephone Encounter (Signed)
Returned call to patient. Explained MD advice concerning med change, lab work, f/up.   She will have BMET on 9/11 (@ INR check in [place] is possible, if not at [facility] in [place]). She has been scheduled to see PA on 02/15/19 @ 1045am

## 2019-02-04 NOTE — Telephone Encounter (Signed)
FYI from visit yesterday.

## 2019-02-04 NOTE — Telephone Encounter (Signed)
LMTCB

## 2019-02-05 ENCOUNTER — Other Ambulatory Visit: Payer: Self-pay

## 2019-02-05 ENCOUNTER — Other Ambulatory Visit: Payer: Self-pay | Admitting: Internal Medicine

## 2019-02-05 DIAGNOSIS — J9612 Chronic respiratory failure with hypercapnia: Secondary | ICD-10-CM | POA: Diagnosis not present

## 2019-02-05 DIAGNOSIS — I5043 Acute on chronic combined systolic (congestive) and diastolic (congestive) heart failure: Secondary | ICD-10-CM | POA: Diagnosis not present

## 2019-02-05 DIAGNOSIS — E1122 Type 2 diabetes mellitus with diabetic chronic kidney disease: Secondary | ICD-10-CM | POA: Diagnosis not present

## 2019-02-05 DIAGNOSIS — I130 Hypertensive heart and chronic kidney disease with heart failure and stage 1 through stage 4 chronic kidney disease, or unspecified chronic kidney disease: Secondary | ICD-10-CM | POA: Diagnosis not present

## 2019-02-05 DIAGNOSIS — J9611 Chronic respiratory failure with hypoxia: Secondary | ICD-10-CM | POA: Diagnosis not present

## 2019-02-05 DIAGNOSIS — N183 Chronic kidney disease, stage 3 (moderate): Secondary | ICD-10-CM | POA: Diagnosis not present

## 2019-02-05 NOTE — Patient Outreach (Signed)
[facility]) Care Management  02/05/2019  Denise Freeman [facility] [id number]    3rd unsuccessful outreach attempt. Case closure pending.  [place] Care Management [phone]

## 2019-02-09 DIAGNOSIS — I130 Hypertensive heart and chronic kidney disease with heart failure and stage 1 through stage 4 chronic kidney disease, or unspecified chronic kidney disease: Secondary | ICD-10-CM | POA: Diagnosis not present

## 2019-02-09 DIAGNOSIS — J9612 Chronic respiratory failure with hypercapnia: Secondary | ICD-10-CM | POA: Diagnosis not present

## 2019-02-09 DIAGNOSIS — N183 Chronic kidney disease, stage 3 (moderate): Secondary | ICD-10-CM | POA: Diagnosis not present

## 2019-02-09 DIAGNOSIS — J9611 Chronic respiratory failure with hypoxia: Secondary | ICD-10-CM | POA: Diagnosis not present

## 2019-02-09 DIAGNOSIS — I5043 Acute on chronic combined systolic (congestive) and diastolic (congestive) heart failure: Secondary | ICD-10-CM | POA: Diagnosis not present

## 2019-02-09 DIAGNOSIS — E1122 Type 2 diabetes mellitus with diabetic chronic kidney disease: Secondary | ICD-10-CM | POA: Diagnosis not present

## 2019-02-10 ENCOUNTER — Other Ambulatory Visit: Payer: Self-pay

## 2019-02-10 ENCOUNTER — Telehealth: Payer: Self-pay | Admitting: Internal Medicine

## 2019-02-10 MED ORDER — TORSEMIDE 20 MG PO TABS: 80.0000 mg | ORAL_TABLET | Freq: Two times a day (BID) | ORAL | 6 refills | Status: DC

## 2019-02-10 NOTE — Telephone Encounter (Signed)
Please advise if OK to refill. Demadex listed under Historical provider.

## 2019-02-10 NOTE — Telephone Encounter (Signed)
Ok to refill torsemide. Refill sent.  Med change was make on 02/04/19 in telephone encounter.

## 2019-02-10 NOTE — Telephone Encounter (Signed)
New Message   *STAT* If patient is at the pharmacy, call can be transferred to refill team.   1. Which medications need to be refilled? (please list name of each medication and dose if known)   torsemide (DEMADEX) 20 MG tablet  metolazone (ZAROXOLYN) 5 MG tablet  2. Which pharmacy/location (including street and city if local pharmacy) is medication to be sent to? [place], [place]  3. Do they need a 30 day or 90 day supply? 30 day

## 2019-02-12 ENCOUNTER — Ambulatory Visit (INDEPENDENT_AMBULATORY_CARE_PROVIDER_SITE_OTHER): Payer: Medicare Other | Admitting: *Deleted

## 2019-02-12 ENCOUNTER — Other Ambulatory Visit: Payer: Self-pay

## 2019-02-12 DIAGNOSIS — I480 Paroxysmal atrial fibrillation: Secondary | ICD-10-CM

## 2019-02-12 DIAGNOSIS — Z5181 Encounter for therapeutic drug level monitoring: Secondary | ICD-10-CM | POA: Diagnosis not present

## 2019-02-12 LAB — POCT INR: INR: 3.9 — AB (ref 2.0–3.0)

## 2019-02-12 NOTE — Patient Instructions (Signed)
Hold coumadin tonight then decrease dose to 1 tablet daily except 1/2 tablet on Sundays and Thursdays Recheck in 3 weeks On increased dose of fluid meds

## 2019-02-15 ENCOUNTER — Other Ambulatory Visit: Payer: Self-pay

## 2019-02-15 ENCOUNTER — Encounter: Payer: Self-pay | Admitting: General Practice

## 2019-02-15 ENCOUNTER — Ambulatory Visit (INDEPENDENT_AMBULATORY_CARE_PROVIDER_SITE_OTHER): Payer: Medicare Other | Admitting: General Practice

## 2019-02-15 VITALS — BP 116/65 | HR 72 | Ht 59.0 in | Wt 201.6 lb

## 2019-02-15 DIAGNOSIS — I5043 Acute on chronic combined systolic (congestive) and diastolic (congestive) heart failure: Secondary | ICD-10-CM

## 2019-02-15 DIAGNOSIS — N183 Chronic kidney disease, stage 3 unspecified: Secondary | ICD-10-CM

## 2019-02-15 DIAGNOSIS — I4819 Other persistent atrial fibrillation: Secondary | ICD-10-CM | POA: Diagnosis not present

## 2019-02-15 DIAGNOSIS — E782 Mixed hyperlipidemia: Secondary | ICD-10-CM | POA: Diagnosis not present

## 2019-02-15 DIAGNOSIS — I10 Essential (primary) hypertension: Secondary | ICD-10-CM | POA: Diagnosis not present

## 2019-02-15 MED ORDER — METOLAZONE 5 MG PO TABS: ORAL_TABLET | ORAL | 0 refills | Status: DC

## 2019-02-15 MED ORDER — TORSEMIDE 20 MG PO TABS: 80.0000 mg | ORAL_TABLET | Freq: Two times a day (BID) | ORAL | 6 refills | Status: DC

## 2019-02-15 NOTE — Patient Instructions (Signed)
Medication Instructions:  Your physician recommends that you continue on your current medications as directed. Please refer to the Current Medication list given to you today.  If you need a refill on your cardiac medications before your next appointment, please call your pharmacy.   Lab work: You will need to have labs (blood work) drawn today:  BMET If you have labs (blood work) drawn today and your tests are completely normal, you will receive your results only by: Marland Kitchen MyChart Message (if you have MyChart) OR . A paper copy in the mail If you have any lab test that is abnormal or we need to change your treatment, we will call you to review the results.  Testing/Procedures: NONE ordered at this time of appointment   Follow-Up: At [facility], you and your health needs are our priority.  As part of our continuing mission to provide you with exceptional heart care, we have created designated Provider Care Teams.  These Care Teams include your primary Cardiologist (physician) and Advanced Practice Providers (APPs -  Physician Assistants and Nurse Practitioners) who all work together to provide you with the care you need, when you need it. You will need a follow up appointment in 3 months with Pixie Casino, MD or one of the following Advanced Practice Providers on your designated Care Team: [place], [place] . Fabian Sharp, PA-C  Any Other Special Instructions Will Be Listed Below (If Applicable).

## 2019-02-15 NOTE — Progress Notes (Signed)
Cardiology Clinic Note   Patient Name: Denise Freeman Date of Encounter: 02/15/2019  Primary Care Provider:  Celene Squibb, MD Primary Cardiologist:  Pixie Casino, MD  Patient Profile    Denise Freeman 68 year old female presents today for follow-up of her atrial fibrillation,  combined CHF, and nonischemic cardiomyopathy.  Past Medical History    Past Medical History:  Diagnosis Date   Allergy    Anemia    Anxiety    Asthma    Atrial fibrillation ([place])    Bipolar 1 disorder ([place])    Blind    "partially in both eyes" (03/14/2016)   Cholelithiasis    a. 09/2016 s/p Lap Chole.   Chronic bronchitis (HCC)    Chronic combined systolic and diastolic congestive heart failure ([place])    a. 09/2016 Echo: EF 30-35%.   Colon polyps    COPD (chronic obstructive pulmonary disease) (HCC)    Depression    Family history of adverse reaction to anesthesia    Uncle was positive for malignant hyperthermia; patient had testing done and was negative.   Fibromyalgia    GERD (gastroesophageal reflux disease)    Gout    High cholesterol    History of blood transfusion 06/2015   "bleeding from my rectum"   History of hiatal hernia    HOH (hard of hearing)    Hx of colonic polyps 03/21/2016   3 small adenomas no recall - co-morbidities   Neuropathy    Disc Back    NICM (nonischemic cardiomyopathy) ([place])    a. Previously worked up in [place], MD-->low EF with subsequent recovery.  Multiple caths (last ~ 2014 per pt report)--reportedly nl cors;  b. 09/2016 Echo: EF 30-35%, antsept/apical HK, mild MR, mildly dil LA, mod dil RA;  c. 09/2016 Lexi MV: EF 26%, glob HK, sept DK, med size, mod intensity fixed septal defect - BBB/PVC related artifact, no ischemia.   On home oxygen therapy    "3L; 24/7" (03/14/2016)   OSA treated with BiPAP    uses biPAP, 10 (03/14/2016)   Osteoarthritis    Oxygen deficiency    Pneumonia    Type II diabetes mellitus ([place])    Past  Surgical History:  Procedure Laterality Date   APPENDECTOMY     "they busted"   bladder stimulator     pt states, "it cannot be turned off; it's in my right hip; dead battery so it's not working anymore". (03/14/2016)   BLADDER SUSPENSION     2003, 2006 and 2010   CATARACT EXTRACTION W/PHACO Right 11/29/2014   Procedure: CATARACT EXTRACTION PHACO AND INTRAOCULAR LENS PLACEMENT (IOC);  Surgeon: Rutherford Guys, MD;  Location: [facility];  Service: Ophthalmology;  Laterality: Right;  CDE:3.81   CATARACT EXTRACTION W/PHACO Left 12/13/2014   Procedure: CATARACT EXTRACTION PHACO AND INTRAOCULAR LENS PLACEMENT (IOC);  Surgeon: Rutherford Guys, MD;  Location: [facility];  Service: Ophthalmology;  Laterality: Left;  CDE:6.59   CERVICAL DISC SURGERY N/A 2009   4, 6, and 7 cervical disc replaced   CHOLECYSTECTOMY N/A 09/13/2016   Procedure: LAPAROSCOPIC CHOLECYSTECTOMY;  Surgeon: Rolm Bookbinder, MD;  Location: [facility];  Service: General;  Laterality: N/A;   COLONOSCOPY WITH PROPOFOL N/A 03/15/2016   Procedure: COLONOSCOPY WITH PROPOFOL;  Surgeon: Gatha Mayer, MD;  Location: [facility];  Service: Endoscopy;  Laterality: N/A;   ESOPHAGOGASTRODUODENOSCOPY (EGD) WITH PROPOFOL N/A 03/15/2016   Procedure: ESOPHAGOGASTRODUODENOSCOPY (EGD) WITH PROPOFOL;  Surgeon: Gatha Mayer, MD;  Location: [facility]  ENDOSCOPY;  Service: Endoscopy;  Laterality: N/A;   HEEL SPUR SURGERY Bilateral    HERNIA REPAIR     I&D EXTREMITY Right 06/13/2015   Procedure: MINOR IRRIGATION AND DEBRIDEMENT EXTREMITY REMOVAL OF NAIL;  Surgeon: Daryll Brod, MD;  Location: [place];  Service: Orthopedics;  Laterality: Right;   TUBAL LIGATION     UMBILICAL HERNIA REPAIR     w/mesh    Allergies  Allergies  Allergen Reactions   Xarelto [Rivaroxaban] Other (See Comments)    Internal bleeding   Ancef [Cefazolin] Nausea And Vomiting   Levaquin [Levofloxacin In D5w] Other (See Comments)    "afib"   Lyrica [Pregabalin]  Hives   Tamiflu [Oseltamivir Phosphate] Other (See Comments)    "water blisters"   Zoloft [Sertraline Hcl] Other (See Comments)    Jaw problems, jittery   Augmentin [Amoxicillin-Pot Clavulanate] Itching   Ciprofloxacin Itching and Nausea And Vomiting   Haldol [Haloperidol] Other (See Comments)    Restless leg   Nsaids Diarrhea   Penicillins Itching, Nausea And Vomiting and Rash    Has patient had a PCN reaction causing immediate rash, facial/tongue/throat swelling, SOB or lightheadedness with hypotension: Yes Has patient had a PCN reaction causing severe rash involving mucus membranes or skin necrosis: No Has patient had a PCN reaction that required hospitalization No Has patient had a PCN reaction occurring within the last 10 years: Yes If all of the above answers are "NO", then may proceed with Cephalosporin use.   Topamax [Topiramate] Nausea Only    History of Present Illness    [name] was last seen by [name] on 02/03/2019 via virtual platform.  During that time she was doing well and her breathing was back to her baseline.  She had recently been admitted to [facility] on August 21 and discharged on August 24 for  acute on chronic congestive heart failure that is complicated by her COPD and CKD.  She was noted to be fluid volume overloaded and received IV diuresis.  Her Lasix was changed to torsemide 60 mg twice daily and her metolazone was increased from 2.5mg  to 5 mg daily.  Her PMH also includes atrial fibrillation, combined systolic and diastolic CHF, type 2 diabetes, and ICM, essential hypertension, chronic hypercapnia, OSA, COPD, chronic upper GI bleeding, CKD stage III, and fibromyalgia.  She presents to the clinic today and states she feels like her lower extremity swelling is less when it was when she was discharged from the hospital.  However, she states that she still has trouble getting around her house.  She continues to be on 3 L of oxygen at home.  And  states she actively walks back and forth in her house up to 20 times per day.  She has a home health nurse that helps her with her daily activities and has her medications delivered in blister packs.  She states she is switched to seasoning her foods with Mrs. Deliah Boston, is wearing support stockings (although she feels they are too tight), and is trying to be more active.  He denies chest pain, increased shortness of breath, increased lower extremity edema, fatigue, palpitations, melena, hematuria, hemoptysis, diaphoresis, weakness, presyncope, syncope, and PND.  Home Medications    Prior to Admission medications   Medication Sig Start Date End Date Taking? Authorizing Provider  albuterol (PROVENTIL) (2.5 MG/3ML) 0.083% nebulizer solution USE (1) IN NEBULIZER EVERY 6 HOURS AS NEEDED FOR SHORTNESS OF BREATH. 10/12/18   Chesley Mires, MD  allopurinol (ZYLOPRIM) 100 MG tablet TAKE 1 TABLET BY MOUTH ONCE DAILY. 12/08/17   Lucila Maine C, DO  budesonide (PULMICORT) 0.5 MG/2ML nebulizer solution Take 0.5 mg by nebulization 2 (two) times daily.    [provider]  cetirizine (ZYRTEC) 10 MG tablet Take 1 tablet (10 mg total) by mouth at bedtime. 01/30/17   Rogue Bussing, MD  cholecalciferol (VITAMIN D) 400 units TABS tablet Take 1 tablet (400 Units total) by mouth 2 (two) times daily. 07/17/17   Rogue Bussing, MD  colchicine 0.6 MG tablet Take 1 tablet (0.6 mg total) by mouth as needed. 01/25/19   Kathie Dike, MD  docusate sodium (COLACE) 100 MG capsule Take 100 mg by mouth daily.    [provider]  escitalopram (LEXAPRO) 10 MG tablet Take 1 tablet (10 mg total) by mouth at bedtime. 10/24/17   Norman Clay, MD  fenofibrate 54 MG tablet Take 54 mg by mouth daily.    [provider]  FEROSUL 325 (65 Fe) MG tablet Take 1 tablet by mouth daily. 01/08/19   [provider]  fluticasone (FLONASE) 50 MCG/ACT nasal spray Place 1 spray into both nostrils 2 (two)  times daily.    [provider]  Insulin Glargine (BASAGLAR KWIKPEN) 100 UNIT/ML SOPN Inject 0.6 mLs (60 Units total) into the skin at bedtime. 11/12/17   Cassandria Anger, MD  insulin lispro (HUMALOG KWIKPEN) 100 UNIT/ML KiwkPen Inject 0.1-0.16 mLs (10-16 Units total) into the skin 3 (three) times daily before meals. 11/12/17   Cassandria Anger, MD  Insulin Pen Needle 32G X 4 MM MISC Use to inject insulin 5 times daily 03/27/18   Philemon Kingdom, MD  ipratropium (ATROVENT) 0.02 % nebulizer solution USE (1) IN NEBULIZER EVERY 6 HOURS AS NEEDED FOR SHORTNESS OF BREATH. 10/12/18   Chesley Mires, MD  losartan (COZAAR) 25 MG tablet Take 1 tablet (25 mg total) by mouth daily. 10/14/18   Hilty, Nadean Corwin, MD  metolazone (ZAROXOLYN) 5 MG tablet TAKE 1 TABLET BY MOUTH ONCE DAILY AS NEEDED FOR EDEMA. * TAKE 30 MINUTES BEFORE torsemide* 01/25/19   Kathie Dike, MD  metoprolol succinate (TOPROL-XL) 25 MG 24 hr tablet Take 1 tablet (25 mg total) by mouth 2 (two) times daily. 08/04/18   Hilty, Nadean Corwin, MD  mirtazapine (REMERON) 15 MG tablet Take 1 tablet (15 mg total) by mouth at bedtime. 10/24/17   Norman Clay, MD  Misc Natural Products (TART CHERRY ADVANCED PO) Take by mouth daily.    [provider]  modafinil (PROVIGIL) 200 MG tablet Take 1 tablet (200 mg total) by mouth daily. 03/24/18   Chesley Mires, MD  mometasone (NASONEX) 50 MCG/ACT nasal spray Place 1 spray into the nose daily. 01/10/17   Chesley Mires, MD  montelukast (SINGULAIR) 10 MG tablet TAKE ONE TABLET BY MOUTH AT BEDTIME. 09/07/18   Chesley Mires, MD  mupirocin cream (BACTROBAN) 2 % Apply 1 application topically 2 (two) times daily. 11/27/17   Rogue Bussing, MD  nitroGLYCERIN (NITROSTAT) 0.4 MG SL tablet Place 0.4 mg under the tongue every 5 (five) minutes as needed for chest pain.    [provider]  nystatin (NYSTATIN) powder Apply topically 2 (two) times daily. Apply under breasts 10/09/16   Rogue Bussing, MD  ondansetron (ZOFRAN) 4 MG tablet Take 4 mg by mouth once.    [provider]  oxyCODONE-acetaminophen (PERCOCET/ROXICET) 5-325 MG tablet Take 1 tablet by mouth 2 (two) times daily as  needed for severe pain. 11/27/17   Rogue Bussing, MD  pantoprazole (PROTONIX) 20 MG tablet TAKE (1) TABLET BY MOUTH TWICE A DAY BEFORE MEALS. (BREAKFAST AND SUPPER) 12/02/17   Lucila Maine C, DO  potassium chloride (K-DUR) 10 MEQ tablet TAKE 1 TABLET BY MOUTH ONCE DAILY. 06/11/18   Hilty, Nadean Corwin, MD  predniSONE (DELTASONE) 1 MG tablet Take 1 mg by mouth daily with breakfast.    [provider]  predniSONE (DELTASONE) 5 MG tablet Take 1 tablet (5 mg total) by mouth daily with breakfast. 11/07/17   Chesley Mires, MD  primidone (MYSOLINE) 50 MG tablet TAKE ONE TABLET BY MOUTH AT BEDTIME. 11/10/17   Rogue Bussing, MD  Probiotic Product (PROBIOTIC PO) Take 1 tablet by mouth at bedtime.    [provider]  torsemide (DEMADEX) 20 MG tablet Take 4 tablets (80 mg total) by mouth 2 (two) times daily. 02/10/19   Hilty, Nadean Corwin, MD  triamcinolone lotion (KENALOG) 0.1 % Apply 1 application topically 3 (three) times daily. For up to 14 days and as needed 01/26/16   Martinique, Betty G, MD  TRUE [facility] BLOOD GLUCOSE TEST test strip USE TO TEST BLOOD SUGAR AS DIRECTED. 09/24/17   Cassandria Anger, MD  Vitamin A 2400 MCG (8000 UT) CAPS Take 8,000 Units by mouth every morning.     [provider]  warfarin (COUMADIN) 4 MG tablet TAKE 1 TABLET BY MOUTH ONCE DAILY EXCEPT ON THURSDAY TAKE 1/2 TABLET 01/11/19   Hilty, Nadean Corwin, MD    Family History    Family History  Problem Relation Age of Onset   Heart disease Mother    COPD Mother    Diabetes Mother    Breast cancer Mother    Heart disease Father    Hyperlipidemia Father    COPD Sister    Heart disease Sister    Diabetes Sister    Heart disease Maternal Grandmother    Cancer Maternal Grandmother         stomach   Cancer Maternal Grandfather        lung    Bipolar disorder Brother    She indicated that her mother is deceased. She indicated that her father is deceased. She indicated that her sister is deceased. She reported the following about her brother: substance use. She indicated that her maternal grandmother is deceased. She indicated that her maternal grandfather is deceased. She indicated that her paternal grandmother is deceased. She indicated that her paternal grandfather is deceased.  Social History    Social History   Socioeconomic History   Marital status: Married    Spouse name: Not on file   Number of children: 1   Years of education: Not on file   Highest education level: Not on file  Occupational History   Occupation: house wife  Social Designer, fashion/clothing strain: Not on file   Food insecurity    Worry: Not on file    Inability: Not on file   Transportation needs    Medical: Not on file    Non-medical: Not on file  Tobacco Use   Smoking status: Former Smoker    Packs/day: 2.00    Years: 14.00    Pack years: 28.00    Types: Cigarettes    Quit date: 04/12/1989    Years since quitting: 29.8   Smokeless tobacco: Never Used  Substance and Sexual Activity   Alcohol use: No    Alcohol/week: 0.0 standard  drinks   Drug use: No   Sexual activity: Never  Lifestyle   Physical activity    Days per week: Not on file    Minutes per session: Not on file   Stress: Not on file  Relationships   Social connections    Talks on phone: Not on file    Gets together: Not on file    Attends religious service: Not on file    Active member of club or organization: Not on file    Attends meetings of clubs or organizations: Not on file    Relationship status: Not on file   Intimate partner violence    Fear of current or ex partner: Not on file    Emotionally abused: Not on file    Physically abused: Not on file    Forced sexual activity:  Not on file  Other Topics Concern   Not on file  Social History Narrative   Married   Disabled   1 child; 31 yrs   6 pregnancies   1 child     Review of Systems    General:  No chills, fever, night sweats or weight changes.  Cardiovascular:  No chest pain, dyspnea on exertion, edema, orthopnea, palpitations, paroxysmal nocturnal dyspnea. Dermatological: No rash, lesions/masses Respiratory: No cough, dyspnea Urologic: No hematuria, dysuria Abdominal:   No nausea, vomiting, diarrhea, bright red blood per rectum, melena, or hematemesis Neurologic:  No visual changes, wkns, changes in mental status. All other systems reviewed and are otherwise negative except as noted above.  Physical Exam    VS:  BP 116/65    Pulse 72    Ht 4\' 11"  (1.499 m)    Wt 201 lb 9.6 oz (91.4 kg)    SpO2 98%    BMI 40.72 kg/m  , BMI Body mass index is 40.72 kg/m. GEN: Well nourished, well developed, in no acute distress. HEENT: normal. Neck: Supple, no JVD, carotid bruits, or masses. Cardiac: RRR, no murmurs, rubs, or gallops. No clubbing, cyanosis, edema.  Radials/DP/PT 2+ and equal bilaterally.  Respiratory:  Respirations regular and unlabored, clear to auscultation bilaterally. GI: Soft, nontender, nondistended, BS + x 4. MS: no deformity or atrophy. Skin: warm and dry, no rash. Neuro:  Strength and sensation are intact. Psych: Normal affect.  Accessory Clinical Findings    ECG personally reviewed by me today-none today  EKG 01/25/2019 Atrial fibrillation 93 bpm  Echocardiogram 05/08/2018 Study Conclusions  - Left ventricle: Not well visualized. The cavity size was normal.   Wall thickness was normal. Systolic function was mildly to   moderately reduced. The estimated ejection fraction was in the   range of 40% to 45%. Diffuse hypokinesis. There is hypokinesis of   the anterolateral myocardium. Doppler parameters are consistent   with a reversible restrictive pattern, indicative of  decreased   left ventricular diastolic compliance and/or increased left   atrial pressure (grade 3 diastolic dysfunction). - Mitral valve: There was mild regurgitation. - Left atrium: The atrium was moderately dilated. - Right atrium: The atrium was severely dilated. - Tricuspid valve: There was trivial regurgitation. - Pulmonary arteries: Systolic pressure was mildly increased. PA   peak pressure: 36 mm Hg (S).  Assessment & Plan   1.  Acute on chronic combined systolic and diastolic CHF- weight today 201.6   from 201 pounds on 02/03/2019.  On 01/25/2019 weight 196.65 pounds.  Echocardiogram LV EF 99-24%, grade 3 diastolic dysfunction Continue torsemide 80 mg twice daily Continue metolazone  5 mg daily Continue K-Dur 10 mEq tablet daily Heart healthy low-sodium diet-salty 6 given Increase physical activity as tolerated Continue support stocking use Elevate lower extremities  2.  CKD III- creatinine baseline 1.15- 1.29 Repeat BMP today  3.  Permanent atrial fibrillation EKG 01/25/2019 atrial fibrillation 93 bpm Continue warfarin 4 mg tablet daily except for Thursday take half tablet Continue Coumadin clinic visits  4.  Essential hypertension- blood pressure today 116/65 Continue metoprolol succinate 25 mg tablet twice daily  Continue losartan 25 mg tablet daily Heart healthy low-sodium diet Increase physical activity as tolerated  5.  Dyslipidemia-LDL 86 07/29/2017 Increase fiber in heart healthy low-sodium diet Increase physical activity as tolerated  Disposition: Follow-up with [name] in 3 months  Deberah Pelton, NP-C 02/15/2019, 12:06 PM

## 2019-02-16 ENCOUNTER — Other Ambulatory Visit: Payer: Self-pay | Admitting: General Practice

## 2019-02-16 DIAGNOSIS — I5023 Acute on chronic systolic (congestive) heart failure: Secondary | ICD-10-CM

## 2019-02-16 LAB — BASIC METABOLIC PANEL
BUN/Creatinine Ratio: 46 — ABNORMAL HIGH (ref 12–28)
BUN: 66 mg/dL — ABNORMAL HIGH (ref 8–27)
CO2: 31 mmol/L — ABNORMAL HIGH (ref 20–29)
Calcium: 8.1 mg/dL — ABNORMAL LOW (ref 8.7–10.3)
Chloride: 89 mmol/L — ABNORMAL LOW (ref 96–106)
Creatinine, Ser: 1.44 mg/dL — ABNORMAL HIGH (ref 0.57–1.00)
GFR calc Af Amer: 43 mL/min/{1.73_m2} — ABNORMAL LOW (ref >59)
GFR calc non Af Amer: 38 mL/min/{1.73_m2} — ABNORMAL LOW (ref >59)
Glucose: 269 mg/dL — ABNORMAL HIGH (ref 65–99)
Potassium: 3.6 mmol/L (ref 3.5–5.2)
Sodium: 139 mmol/L (ref 134–144)

## 2019-02-16 MED ORDER — METOLAZONE 5 MG PO TABS: ORAL_TABLET | ORAL | 0 refills | Status: DC

## 2019-02-16 NOTE — Progress Notes (Signed)
Spoke with [name] about patients current CHF and plan of care.  Maintain current diuresis with torsemide and Zaroxolyn.

## 2019-02-18 DIAGNOSIS — I130 Hypertensive heart and chronic kidney disease with heart failure and stage 1 through stage 4 chronic kidney disease, or unspecified chronic kidney disease: Secondary | ICD-10-CM | POA: Diagnosis not present

## 2019-02-18 DIAGNOSIS — I5043 Acute on chronic combined systolic (congestive) and diastolic (congestive) heart failure: Secondary | ICD-10-CM | POA: Diagnosis not present

## 2019-02-18 DIAGNOSIS — J9611 Chronic respiratory failure with hypoxia: Secondary | ICD-10-CM | POA: Diagnosis not present

## 2019-02-18 DIAGNOSIS — N183 Chronic kidney disease, stage 3 (moderate): Secondary | ICD-10-CM | POA: Diagnosis not present

## 2019-02-18 DIAGNOSIS — E1122 Type 2 diabetes mellitus with diabetic chronic kidney disease: Secondary | ICD-10-CM | POA: Diagnosis not present

## 2019-02-18 DIAGNOSIS — J9612 Chronic respiratory failure with hypercapnia: Secondary | ICD-10-CM | POA: Diagnosis not present

## 2019-02-19 DIAGNOSIS — E1122 Type 2 diabetes mellitus with diabetic chronic kidney disease: Secondary | ICD-10-CM | POA: Diagnosis not present

## 2019-02-19 DIAGNOSIS — E11620 Type 2 diabetes mellitus with diabetic dermatitis: Secondary | ICD-10-CM | POA: Diagnosis not present

## 2019-02-19 DIAGNOSIS — D631 Anemia in chronic kidney disease: Secondary | ICD-10-CM | POA: Diagnosis not present

## 2019-02-19 DIAGNOSIS — E785 Hyperlipidemia, unspecified: Secondary | ICD-10-CM | POA: Diagnosis not present

## 2019-02-19 DIAGNOSIS — E559 Vitamin D deficiency, unspecified: Secondary | ICD-10-CM | POA: Diagnosis not present

## 2019-02-19 DIAGNOSIS — E782 Mixed hyperlipidemia: Secondary | ICD-10-CM | POA: Diagnosis not present

## 2019-02-19 DIAGNOSIS — E1165 Type 2 diabetes mellitus with hyperglycemia: Secondary | ICD-10-CM | POA: Diagnosis not present

## 2019-02-19 DIAGNOSIS — D509 Iron deficiency anemia, unspecified: Secondary | ICD-10-CM | POA: Diagnosis not present

## 2019-02-22 ENCOUNTER — Other Ambulatory Visit: Payer: Self-pay | Admitting: Internal Medicine

## 2019-02-22 DIAGNOSIS — I5023 Acute on chronic systolic (congestive) heart failure: Secondary | ICD-10-CM

## 2019-02-22 MED ORDER — METOLAZONE 5 MG PO TABS: ORAL_TABLET | ORAL | 10 refills | Status: DC

## 2019-02-22 NOTE — Telephone Encounter (Signed)
This is Dr. Hilty's pt 

## 2019-02-22 NOTE — Telephone Encounter (Signed)
This is a [place] pt.  °

## 2019-02-22 NOTE — Telephone Encounter (Signed)
New Message   *STAT* If patient is at the pharmacy, call can be transferred to refill team.   1. Which medications need to be refilled? (please list name of each medication and dose if known) metolazone (ZAROXOLYN) 5 MG tablet  potassium chloride (K-DUR) 10 MEQ tablet  2. Which pharmacy/location (including street and city if local pharmacy) is medication to be sent to? [place]. Do they need a 30 day or 90 day supply? 30 day

## 2019-02-24 MED ORDER — POTASSIUM CHLORIDE ER 10 MEQ PO TBCR: 10.0000 meq | EXTENDED_RELEASE_TABLET | Freq: Every day | ORAL | 11 refills | Status: DC

## 2019-02-24 MED ORDER — METOLAZONE 5 MG PO TABS: ORAL_TABLET | ORAL | 11 refills | Status: DC

## 2019-02-24 NOTE — Telephone Encounter (Signed)
Rx(s) sent to pharmacy electronically.  

## 2019-02-25 DIAGNOSIS — N183 Chronic kidney disease, stage 3 (moderate): Secondary | ICD-10-CM | POA: Diagnosis not present

## 2019-02-25 DIAGNOSIS — E1122 Type 2 diabetes mellitus with diabetic chronic kidney disease: Secondary | ICD-10-CM | POA: Diagnosis not present

## 2019-02-25 DIAGNOSIS — I5043 Acute on chronic combined systolic (congestive) and diastolic (congestive) heart failure: Secondary | ICD-10-CM | POA: Diagnosis not present

## 2019-02-25 DIAGNOSIS — I130 Hypertensive heart and chronic kidney disease with heart failure and stage 1 through stage 4 chronic kidney disease, or unspecified chronic kidney disease: Secondary | ICD-10-CM | POA: Diagnosis not present

## 2019-02-25 DIAGNOSIS — J9611 Chronic respiratory failure with hypoxia: Secondary | ICD-10-CM | POA: Diagnosis not present

## 2019-02-25 DIAGNOSIS — J9612 Chronic respiratory failure with hypercapnia: Secondary | ICD-10-CM | POA: Diagnosis not present

## 2019-02-26 DIAGNOSIS — E1122 Type 2 diabetes mellitus with diabetic chronic kidney disease: Secondary | ICD-10-CM | POA: Diagnosis not present

## 2019-02-26 DIAGNOSIS — M1000 Idiopathic gout, unspecified site: Secondary | ICD-10-CM | POA: Diagnosis not present

## 2019-02-26 DIAGNOSIS — M797 Fibromyalgia: Secondary | ICD-10-CM | POA: Diagnosis not present

## 2019-02-26 DIAGNOSIS — F319 Bipolar disorder, unspecified: Secondary | ICD-10-CM | POA: Diagnosis not present

## 2019-02-26 DIAGNOSIS — E782 Mixed hyperlipidemia: Secondary | ICD-10-CM | POA: Diagnosis not present

## 2019-02-26 DIAGNOSIS — Z79891 Long term (current) use of opiate analgesic: Secondary | ICD-10-CM | POA: Diagnosis not present

## 2019-02-26 DIAGNOSIS — G894 Chronic pain syndrome: Secondary | ICD-10-CM | POA: Diagnosis not present

## 2019-02-26 DIAGNOSIS — E1165 Type 2 diabetes mellitus with hyperglycemia: Secondary | ICD-10-CM | POA: Diagnosis not present

## 2019-02-26 DIAGNOSIS — F411 Generalized anxiety disorder: Secondary | ICD-10-CM | POA: Diagnosis not present

## 2019-02-26 DIAGNOSIS — M545 Low back pain: Secondary | ICD-10-CM | POA: Diagnosis not present

## 2019-02-26 DIAGNOSIS — N183 Chronic kidney disease, stage 3 (moderate): Secondary | ICD-10-CM | POA: Diagnosis not present

## 2019-02-26 DIAGNOSIS — J302 Other seasonal allergic rhinitis: Secondary | ICD-10-CM | POA: Diagnosis not present

## 2019-02-28 DIAGNOSIS — E785 Hyperlipidemia, unspecified: Secondary | ICD-10-CM | POA: Diagnosis not present

## 2019-02-28 DIAGNOSIS — E1140 Type 2 diabetes mellitus with diabetic neuropathy, unspecified: Secondary | ICD-10-CM | POA: Diagnosis not present

## 2019-02-28 DIAGNOSIS — J9611 Chronic respiratory failure with hypoxia: Secondary | ICD-10-CM | POA: Diagnosis not present

## 2019-02-28 DIAGNOSIS — F419 Anxiety disorder, unspecified: Secondary | ICD-10-CM | POA: Diagnosis not present

## 2019-02-28 DIAGNOSIS — E1122 Type 2 diabetes mellitus with diabetic chronic kidney disease: Secondary | ICD-10-CM | POA: Diagnosis not present

## 2019-02-28 DIAGNOSIS — N183 Chronic kidney disease, stage 3 (moderate): Secondary | ICD-10-CM | POA: Diagnosis not present

## 2019-02-28 DIAGNOSIS — K219 Gastro-esophageal reflux disease without esophagitis: Secondary | ICD-10-CM | POA: Diagnosis not present

## 2019-02-28 DIAGNOSIS — E1159 Type 2 diabetes mellitus with other circulatory complications: Secondary | ICD-10-CM | POA: Diagnosis not present

## 2019-02-28 DIAGNOSIS — I5043 Acute on chronic combined systolic (congestive) and diastolic (congestive) heart failure: Secondary | ICD-10-CM | POA: Diagnosis not present

## 2019-02-28 DIAGNOSIS — Z87891 Personal history of nicotine dependence: Secondary | ICD-10-CM | POA: Diagnosis not present

## 2019-02-28 DIAGNOSIS — D649 Anemia, unspecified: Secondary | ICD-10-CM | POA: Diagnosis not present

## 2019-02-28 DIAGNOSIS — F319 Bipolar disorder, unspecified: Secondary | ICD-10-CM | POA: Diagnosis not present

## 2019-02-28 DIAGNOSIS — I130 Hypertensive heart and chronic kidney disease with heart failure and stage 1 through stage 4 chronic kidney disease, or unspecified chronic kidney disease: Secondary | ICD-10-CM | POA: Diagnosis not present

## 2019-02-28 DIAGNOSIS — Z7901 Long term (current) use of anticoagulants: Secondary | ICD-10-CM | POA: Diagnosis not present

## 2019-02-28 DIAGNOSIS — J449 Chronic obstructive pulmonary disease, unspecified: Secondary | ICD-10-CM | POA: Diagnosis not present

## 2019-02-28 DIAGNOSIS — M1030 Gout due to renal impairment, unspecified site: Secondary | ICD-10-CM | POA: Diagnosis not present

## 2019-02-28 DIAGNOSIS — Z8701 Personal history of pneumonia (recurrent): Secondary | ICD-10-CM | POA: Diagnosis not present

## 2019-02-28 DIAGNOSIS — M797 Fibromyalgia: Secondary | ICD-10-CM | POA: Diagnosis not present

## 2019-02-28 DIAGNOSIS — G4733 Obstructive sleep apnea (adult) (pediatric): Secondary | ICD-10-CM | POA: Diagnosis not present

## 2019-02-28 DIAGNOSIS — J9612 Chronic respiratory failure with hypercapnia: Secondary | ICD-10-CM | POA: Diagnosis not present

## 2019-02-28 DIAGNOSIS — M1990 Unspecified osteoarthritis, unspecified site: Secondary | ICD-10-CM | POA: Diagnosis not present

## 2019-02-28 DIAGNOSIS — H542X11 Low vision right eye category 1, low vision left eye category 1: Secondary | ICD-10-CM | POA: Diagnosis not present

## 2019-02-28 DIAGNOSIS — I428 Other cardiomyopathies: Secondary | ICD-10-CM | POA: Diagnosis not present

## 2019-02-28 DIAGNOSIS — I4891 Unspecified atrial fibrillation: Secondary | ICD-10-CM | POA: Diagnosis not present

## 2019-02-28 DIAGNOSIS — Z794 Long term (current) use of insulin: Secondary | ICD-10-CM | POA: Diagnosis not present

## 2019-03-02 DIAGNOSIS — J9612 Chronic respiratory failure with hypercapnia: Secondary | ICD-10-CM | POA: Diagnosis not present

## 2019-03-02 DIAGNOSIS — I5043 Acute on chronic combined systolic (congestive) and diastolic (congestive) heart failure: Secondary | ICD-10-CM | POA: Diagnosis not present

## 2019-03-02 DIAGNOSIS — E1122 Type 2 diabetes mellitus with diabetic chronic kidney disease: Secondary | ICD-10-CM | POA: Diagnosis not present

## 2019-03-02 DIAGNOSIS — N183 Chronic kidney disease, stage 3 (moderate): Secondary | ICD-10-CM | POA: Diagnosis not present

## 2019-03-02 DIAGNOSIS — J9611 Chronic respiratory failure with hypoxia: Secondary | ICD-10-CM | POA: Diagnosis not present

## 2019-03-02 DIAGNOSIS — I130 Hypertensive heart and chronic kidney disease with heart failure and stage 1 through stage 4 chronic kidney disease, or unspecified chronic kidney disease: Secondary | ICD-10-CM | POA: Diagnosis not present

## 2019-03-03 DIAGNOSIS — Z23 Encounter for immunization: Secondary | ICD-10-CM | POA: Diagnosis not present

## 2019-03-11 ENCOUNTER — Other Ambulatory Visit: Payer: Self-pay | Admitting: Pulmonary Disease

## 2019-03-11 ENCOUNTER — Other Ambulatory Visit: Payer: Self-pay | Admitting: Internal Medicine

## 2019-03-11 NOTE — Telephone Encounter (Signed)
Refill Request.  

## 2019-03-16 ENCOUNTER — Ambulatory Visit (INDEPENDENT_AMBULATORY_CARE_PROVIDER_SITE_OTHER): Payer: Medicare Other | Admitting: *Deleted

## 2019-03-16 ENCOUNTER — Other Ambulatory Visit: Payer: Self-pay

## 2019-03-16 DIAGNOSIS — I480 Paroxysmal atrial fibrillation: Secondary | ICD-10-CM | POA: Diagnosis not present

## 2019-03-16 DIAGNOSIS — Z5181 Encounter for therapeutic drug level monitoring: Secondary | ICD-10-CM | POA: Diagnosis not present

## 2019-03-16 LAB — POCT INR: INR: 2.4 (ref 2.0–3.0)

## 2019-03-16 NOTE — Patient Instructions (Signed)
Continue warfarin 1 tablet daily except 1/2 tablet on Sundays and Thursdays Recheck in 4 weeks Has not been eating well per pt.

## 2019-03-30 DIAGNOSIS — G4733 Obstructive sleep apnea (adult) (pediatric): Secondary | ICD-10-CM | POA: Diagnosis not present

## 2019-03-30 DIAGNOSIS — F319 Bipolar disorder, unspecified: Secondary | ICD-10-CM | POA: Diagnosis not present

## 2019-03-30 DIAGNOSIS — H9190 Unspecified hearing loss, unspecified ear: Secondary | ICD-10-CM | POA: Diagnosis not present

## 2019-03-30 DIAGNOSIS — F419 Anxiety disorder, unspecified: Secondary | ICD-10-CM | POA: Diagnosis not present

## 2019-03-30 DIAGNOSIS — E559 Vitamin D deficiency, unspecified: Secondary | ICD-10-CM | POA: Diagnosis not present

## 2019-03-30 DIAGNOSIS — I130 Hypertensive heart and chronic kidney disease with heart failure and stage 1 through stage 4 chronic kidney disease, or unspecified chronic kidney disease: Secondary | ICD-10-CM | POA: Diagnosis not present

## 2019-03-30 DIAGNOSIS — E1122 Type 2 diabetes mellitus with diabetic chronic kidney disease: Secondary | ICD-10-CM | POA: Diagnosis not present

## 2019-03-30 DIAGNOSIS — G894 Chronic pain syndrome: Secondary | ICD-10-CM | POA: Diagnosis not present

## 2019-03-30 DIAGNOSIS — E1140 Type 2 diabetes mellitus with diabetic neuropathy, unspecified: Secondary | ICD-10-CM | POA: Diagnosis not present

## 2019-03-30 DIAGNOSIS — J9611 Chronic respiratory failure with hypoxia: Secondary | ICD-10-CM | POA: Diagnosis not present

## 2019-03-30 DIAGNOSIS — I5043 Acute on chronic combined systolic (congestive) and diastolic (congestive) heart failure: Secondary | ICD-10-CM | POA: Diagnosis not present

## 2019-03-30 DIAGNOSIS — M1030 Gout due to renal impairment, unspecified site: Secondary | ICD-10-CM | POA: Diagnosis not present

## 2019-03-30 DIAGNOSIS — E1159 Type 2 diabetes mellitus with other circulatory complications: Secondary | ICD-10-CM | POA: Diagnosis not present

## 2019-03-30 DIAGNOSIS — M1990 Unspecified osteoarthritis, unspecified site: Secondary | ICD-10-CM | POA: Diagnosis not present

## 2019-03-30 DIAGNOSIS — E782 Mixed hyperlipidemia: Secondary | ICD-10-CM | POA: Diagnosis not present

## 2019-03-30 DIAGNOSIS — D631 Anemia in chronic kidney disease: Secondary | ICD-10-CM | POA: Diagnosis not present

## 2019-03-30 DIAGNOSIS — J9612 Chronic respiratory failure with hypercapnia: Secondary | ICD-10-CM | POA: Diagnosis not present

## 2019-03-30 DIAGNOSIS — M797 Fibromyalgia: Secondary | ICD-10-CM | POA: Diagnosis not present

## 2019-03-30 DIAGNOSIS — K219 Gastro-esophageal reflux disease without esophagitis: Secondary | ICD-10-CM | POA: Diagnosis not present

## 2019-03-30 DIAGNOSIS — I4891 Unspecified atrial fibrillation: Secondary | ICD-10-CM | POA: Diagnosis not present

## 2019-03-30 DIAGNOSIS — I428 Other cardiomyopathies: Secondary | ICD-10-CM | POA: Diagnosis not present

## 2019-03-30 DIAGNOSIS — J439 Emphysema, unspecified: Secondary | ICD-10-CM | POA: Diagnosis not present

## 2019-03-30 DIAGNOSIS — H542X11 Low vision right eye category 1, low vision left eye category 1: Secondary | ICD-10-CM | POA: Diagnosis not present

## 2019-03-30 DIAGNOSIS — N183 Chronic kidney disease, stage 3 unspecified: Secondary | ICD-10-CM | POA: Diagnosis not present

## 2019-03-30 DIAGNOSIS — E1165 Type 2 diabetes mellitus with hyperglycemia: Secondary | ICD-10-CM | POA: Diagnosis not present

## 2019-03-31 DIAGNOSIS — N183 Chronic kidney disease, stage 3 unspecified: Secondary | ICD-10-CM | POA: Diagnosis not present

## 2019-03-31 DIAGNOSIS — D631 Anemia in chronic kidney disease: Secondary | ICD-10-CM | POA: Diagnosis not present

## 2019-03-31 DIAGNOSIS — I5043 Acute on chronic combined systolic (congestive) and diastolic (congestive) heart failure: Secondary | ICD-10-CM | POA: Diagnosis not present

## 2019-03-31 DIAGNOSIS — I130 Hypertensive heart and chronic kidney disease with heart failure and stage 1 through stage 4 chronic kidney disease, or unspecified chronic kidney disease: Secondary | ICD-10-CM | POA: Diagnosis not present

## 2019-03-31 DIAGNOSIS — M1030 Gout due to renal impairment, unspecified site: Secondary | ICD-10-CM | POA: Diagnosis not present

## 2019-03-31 DIAGNOSIS — E1122 Type 2 diabetes mellitus with diabetic chronic kidney disease: Secondary | ICD-10-CM | POA: Diagnosis not present

## 2019-04-05 DIAGNOSIS — E1122 Type 2 diabetes mellitus with diabetic chronic kidney disease: Secondary | ICD-10-CM | POA: Diagnosis not present

## 2019-04-05 DIAGNOSIS — D631 Anemia in chronic kidney disease: Secondary | ICD-10-CM | POA: Diagnosis not present

## 2019-04-05 DIAGNOSIS — I130 Hypertensive heart and chronic kidney disease with heart failure and stage 1 through stage 4 chronic kidney disease, or unspecified chronic kidney disease: Secondary | ICD-10-CM | POA: Diagnosis not present

## 2019-04-05 DIAGNOSIS — N183 Chronic kidney disease, stage 3 unspecified: Secondary | ICD-10-CM | POA: Diagnosis not present

## 2019-04-05 DIAGNOSIS — I5043 Acute on chronic combined systolic (congestive) and diastolic (congestive) heart failure: Secondary | ICD-10-CM | POA: Diagnosis not present

## 2019-04-05 DIAGNOSIS — M1030 Gout due to renal impairment, unspecified site: Secondary | ICD-10-CM | POA: Diagnosis not present

## 2019-04-12 DIAGNOSIS — I130 Hypertensive heart and chronic kidney disease with heart failure and stage 1 through stage 4 chronic kidney disease, or unspecified chronic kidney disease: Secondary | ICD-10-CM | POA: Diagnosis not present

## 2019-04-12 DIAGNOSIS — I5043 Acute on chronic combined systolic (congestive) and diastolic (congestive) heart failure: Secondary | ICD-10-CM | POA: Diagnosis not present

## 2019-04-12 DIAGNOSIS — N183 Chronic kidney disease, stage 3 unspecified: Secondary | ICD-10-CM | POA: Diagnosis not present

## 2019-04-12 DIAGNOSIS — M1030 Gout due to renal impairment, unspecified site: Secondary | ICD-10-CM | POA: Diagnosis not present

## 2019-04-12 DIAGNOSIS — D631 Anemia in chronic kidney disease: Secondary | ICD-10-CM | POA: Diagnosis not present

## 2019-04-12 DIAGNOSIS — E1122 Type 2 diabetes mellitus with diabetic chronic kidney disease: Secondary | ICD-10-CM | POA: Diagnosis not present

## 2019-04-14 ENCOUNTER — Telehealth: Payer: Self-pay | Admitting: Pulmonary Disease

## 2019-04-14 MED ORDER — BUDESONIDE 0.5 MG/2ML IN SUSP: 0.5000 mg | Freq: Two times a day (BID) | RESPIRATORY_TRACT | 11 refills | Status: DC

## 2019-04-14 NOTE — Telephone Encounter (Signed)
Refill sent to pt's preferred pharmacy. Called and spoke with pt letting her know this had been done and pt verbalized understanding. Nothing further needed.

## 2019-04-20 DIAGNOSIS — E1122 Type 2 diabetes mellitus with diabetic chronic kidney disease: Secondary | ICD-10-CM | POA: Diagnosis not present

## 2019-04-20 DIAGNOSIS — I5043 Acute on chronic combined systolic (congestive) and diastolic (congestive) heart failure: Secondary | ICD-10-CM | POA: Diagnosis not present

## 2019-04-20 DIAGNOSIS — M1030 Gout due to renal impairment, unspecified site: Secondary | ICD-10-CM | POA: Diagnosis not present

## 2019-04-20 DIAGNOSIS — D631 Anemia in chronic kidney disease: Secondary | ICD-10-CM | POA: Diagnosis not present

## 2019-04-20 DIAGNOSIS — I130 Hypertensive heart and chronic kidney disease with heart failure and stage 1 through stage 4 chronic kidney disease, or unspecified chronic kidney disease: Secondary | ICD-10-CM | POA: Diagnosis not present

## 2019-04-20 DIAGNOSIS — N183 Chronic kidney disease, stage 3 unspecified: Secondary | ICD-10-CM | POA: Diagnosis not present

## 2019-04-27 ENCOUNTER — Other Ambulatory Visit: Payer: Self-pay

## 2019-04-27 ENCOUNTER — Telehealth: Payer: Self-pay | Admitting: Internal Medicine

## 2019-04-27 ENCOUNTER — Inpatient Hospital Stay (HOSPITAL_COMMUNITY)
Admission: EM | Admit: 2019-04-27 | Discharge: 2019-05-02 | DRG: 291 | Disposition: A | Discharge status: Home / Self Care | Payer: Medicare Other | Attending: Internal Medicine | Admitting: Internal Medicine

## 2019-04-27 ENCOUNTER — Encounter (HOSPITAL_COMMUNITY): Payer: Self-pay | Admitting: Emergency Medicine

## 2019-04-27 ENCOUNTER — Emergency Department (HOSPITAL_COMMUNITY): Payer: Medicare Other

## 2019-04-27 DIAGNOSIS — Z7901 Long term (current) use of anticoagulants: Secondary | ICD-10-CM

## 2019-04-27 DIAGNOSIS — Z20828 Contact with and (suspected) exposure to other viral communicable diseases: Secondary | ICD-10-CM | POA: Diagnosis not present

## 2019-04-27 DIAGNOSIS — E785 Hyperlipidemia, unspecified: Secondary | ICD-10-CM | POA: Diagnosis present

## 2019-04-27 DIAGNOSIS — I4891 Unspecified atrial fibrillation: Secondary | ICD-10-CM

## 2019-04-27 DIAGNOSIS — J9622 Acute and chronic respiratory failure with hypercapnia: Secondary | ICD-10-CM | POA: Diagnosis not present

## 2019-04-27 DIAGNOSIS — I5043 Acute on chronic combined systolic (congestive) and diastolic (congestive) heart failure: Secondary | ICD-10-CM | POA: Diagnosis present

## 2019-04-27 DIAGNOSIS — R05 Cough: Secondary | ICD-10-CM | POA: Diagnosis not present

## 2019-04-27 DIAGNOSIS — R0602 Shortness of breath: Secondary | ICD-10-CM | POA: Diagnosis not present

## 2019-04-27 DIAGNOSIS — J9610 Chronic respiratory failure, unspecified whether with hypoxia or hypercapnia: Secondary | ICD-10-CM | POA: Diagnosis present

## 2019-04-27 DIAGNOSIS — R9431 Abnormal electrocardiogram [ECG] [EKG]: Secondary | ICD-10-CM | POA: Diagnosis not present

## 2019-04-27 DIAGNOSIS — E1165 Type 2 diabetes mellitus with hyperglycemia: Secondary | ICD-10-CM | POA: Diagnosis not present

## 2019-04-27 DIAGNOSIS — J441 Chronic obstructive pulmonary disease with (acute) exacerbation: Secondary | ICD-10-CM | POA: Diagnosis not present

## 2019-04-27 DIAGNOSIS — I130 Hypertensive heart and chronic kidney disease with heart failure and stage 1 through stage 4 chronic kidney disease, or unspecified chronic kidney disease: Secondary | ICD-10-CM | POA: Diagnosis not present

## 2019-04-27 DIAGNOSIS — I509 Heart failure, unspecified: Secondary | ICD-10-CM

## 2019-04-27 DIAGNOSIS — Z8249 Family history of ischemic heart disease and other diseases of the circulatory system: Secondary | ICD-10-CM

## 2019-04-27 DIAGNOSIS — J9621 Acute and chronic respiratory failure with hypoxia: Secondary | ICD-10-CM | POA: Diagnosis present

## 2019-04-27 DIAGNOSIS — D72829 Elevated white blood cell count, unspecified: Secondary | ICD-10-CM | POA: Diagnosis present

## 2019-04-27 DIAGNOSIS — H547 Unspecified visual loss: Secondary | ICD-10-CM | POA: Diagnosis present

## 2019-04-27 DIAGNOSIS — J438 Other emphysema: Secondary | ICD-10-CM | POA: Diagnosis not present

## 2019-04-27 DIAGNOSIS — Z9981 Dependence on supplemental oxygen: Secondary | ICD-10-CM

## 2019-04-27 DIAGNOSIS — G894 Chronic pain syndrome: Secondary | ICD-10-CM | POA: Diagnosis present

## 2019-04-27 DIAGNOSIS — Z881 Allergy status to other antibiotic agents status: Secondary | ICD-10-CM

## 2019-04-27 DIAGNOSIS — N1832 Chronic kidney disease, stage 3b: Secondary | ICD-10-CM | POA: Diagnosis present

## 2019-04-27 DIAGNOSIS — N183 Chronic kidney disease, stage 3 unspecified: Secondary | ICD-10-CM | POA: Diagnosis not present

## 2019-04-27 DIAGNOSIS — J9612 Chronic respiratory failure with hypercapnia: Secondary | ICD-10-CM | POA: Diagnosis not present

## 2019-04-27 DIAGNOSIS — Z8349 Family history of other endocrine, nutritional and metabolic diseases: Secondary | ICD-10-CM

## 2019-04-27 DIAGNOSIS — F419 Anxiety disorder, unspecified: Secondary | ICD-10-CM | POA: Diagnosis present

## 2019-04-27 DIAGNOSIS — Z880 Allergy status to penicillin: Secondary | ICD-10-CM

## 2019-04-27 DIAGNOSIS — I4821 Permanent atrial fibrillation: Secondary | ICD-10-CM | POA: Diagnosis not present

## 2019-04-27 DIAGNOSIS — D631 Anemia in chronic kidney disease: Secondary | ICD-10-CM | POA: Diagnosis not present

## 2019-04-27 DIAGNOSIS — Z7951 Long term (current) use of inhaled steroids: Secondary | ICD-10-CM

## 2019-04-27 DIAGNOSIS — Z886 Allergy status to analgesic agent status: Secondary | ICD-10-CM

## 2019-04-27 DIAGNOSIS — Z888 Allergy status to other drugs, medicaments and biological substances status: Secondary | ICD-10-CM

## 2019-04-27 DIAGNOSIS — M109 Gout, unspecified: Secondary | ICD-10-CM | POA: Diagnosis present

## 2019-04-27 DIAGNOSIS — G4733 Obstructive sleep apnea (adult) (pediatric): Secondary | ICD-10-CM | POA: Diagnosis present

## 2019-04-27 DIAGNOSIS — I428 Other cardiomyopathies: Secondary | ICD-10-CM | POA: Diagnosis present

## 2019-04-27 DIAGNOSIS — Z818 Family history of other mental and behavioral disorders: Secondary | ICD-10-CM

## 2019-04-27 DIAGNOSIS — E1122 Type 2 diabetes mellitus with diabetic chronic kidney disease: Secondary | ICD-10-CM | POA: Diagnosis present

## 2019-04-27 DIAGNOSIS — M1030 Gout due to renal impairment, unspecified site: Secondary | ICD-10-CM | POA: Diagnosis not present

## 2019-04-27 DIAGNOSIS — J9611 Chronic respiratory failure with hypoxia: Secondary | ICD-10-CM | POA: Diagnosis not present

## 2019-04-27 DIAGNOSIS — M797 Fibromyalgia: Secondary | ICD-10-CM | POA: Diagnosis present

## 2019-04-27 DIAGNOSIS — E1140 Type 2 diabetes mellitus with diabetic neuropathy, unspecified: Secondary | ICD-10-CM | POA: Diagnosis present

## 2019-04-27 DIAGNOSIS — M1990 Unspecified osteoarthritis, unspecified site: Secondary | ICD-10-CM | POA: Diagnosis present

## 2019-04-27 DIAGNOSIS — K219 Gastro-esophageal reflux disease without esophagitis: Secondary | ICD-10-CM | POA: Diagnosis present

## 2019-04-27 DIAGNOSIS — Z79899 Other long term (current) drug therapy: Secondary | ICD-10-CM

## 2019-04-27 DIAGNOSIS — Z87891 Personal history of nicotine dependence: Secondary | ICD-10-CM

## 2019-04-27 DIAGNOSIS — Z833 Family history of diabetes mellitus: Secondary | ICD-10-CM

## 2019-04-27 DIAGNOSIS — Z6841 Body Mass Index (BMI) 40.0 and over, adult: Secondary | ICD-10-CM

## 2019-04-27 DIAGNOSIS — I493 Ventricular premature depolarization: Secondary | ICD-10-CM | POA: Diagnosis present

## 2019-04-27 DIAGNOSIS — F0630 Mood disorder due to known physiological condition, unspecified: Secondary | ICD-10-CM | POA: Diagnosis present

## 2019-04-27 DIAGNOSIS — Z794 Long term (current) use of insulin: Secondary | ICD-10-CM

## 2019-04-27 DIAGNOSIS — I071 Rheumatic tricuspid insufficiency: Secondary | ICD-10-CM | POA: Diagnosis present

## 2019-04-27 DIAGNOSIS — I371 Nonrheumatic pulmonary valve insufficiency: Secondary | ICD-10-CM | POA: Diagnosis present

## 2019-04-27 DIAGNOSIS — F329 Major depressive disorder, single episode, unspecified: Secondary | ICD-10-CM | POA: Diagnosis present

## 2019-04-27 DIAGNOSIS — Z825 Family history of asthma and other chronic lower respiratory diseases: Secondary | ICD-10-CM

## 2019-04-27 LAB — BASIC METABOLIC PANEL
Anion gap: 12 (ref 5–15)
BUN: 62 mg/dL — ABNORMAL HIGH (ref 8–23)
CO2: 32 mmol/L (ref 22–32)
Calcium: 8.2 mg/dL — ABNORMAL LOW (ref 8.9–10.3)
Chloride: 91 mmol/L — ABNORMAL LOW (ref 98–111)
Creatinine, Ser: 1.22 mg/dL — ABNORMAL HIGH (ref 0.44–1.00)
GFR calc Af Amer: 53 mL/min — ABNORMAL LOW (ref >60)
GFR calc non Af Amer: 45 mL/min — ABNORMAL LOW (ref >60)
Glucose, Bld: 255 mg/dL — ABNORMAL HIGH (ref 70–99)
Potassium: 3.8 mmol/L (ref 3.5–5.1)
Sodium: 135 mmol/L (ref 135–145)

## 2019-04-27 LAB — CBC
HCT: 37.9 % (ref 36.0–46.0)
Hemoglobin: 11.3 g/dL — ABNORMAL LOW (ref 12.0–15.0)
MCH: 28.8 pg (ref 26.0–34.0)
MCHC: 29.8 g/dL — ABNORMAL LOW (ref 30.0–36.0)
MCV: 96.7 fL (ref 80.0–100.0)
Platelets: 213 10*3/uL (ref 150–400)
RBC: 3.92 MIL/uL (ref 3.87–5.11)
RDW: 13.4 % (ref 11.5–15.5)
WBC: 10.9 10*3/uL — ABNORMAL HIGH (ref 4.0–10.5)
nRBC: 0 % (ref 0.0–0.2)

## 2019-04-27 LAB — TROPONIN I (HIGH SENSITIVITY)
Troponin I (High Sensitivity): 11 ng/L (ref <18)
Troponin I (High Sensitivity): 12 ng/L (ref <18)

## 2019-04-27 LAB — PROTIME-INR
INR: 2.7 — ABNORMAL HIGH (ref 0.8–1.2)
Prothrombin Time: 28.7 seconds — ABNORMAL HIGH (ref 11.4–15.2)

## 2019-04-27 LAB — BRAIN NATRIURETIC PEPTIDE: B Natriuretic Peptide: 318 pg/mL — ABNORMAL HIGH (ref 0.0–100.0)

## 2019-04-27 MED ORDER — FUROSEMIDE 10 MG/ML IJ SOLN
80.0000 mg | Freq: Once | INTRAMUSCULAR | Status: AC
  Administered 2019-04-27: 80 mg via INTRAVENOUS
  Filled 2019-04-27: qty 8

## 2019-04-27 MED ORDER — INSULIN ASPART 100 UNIT/ML ~~LOC~~ SOLN
0.0000 [IU] | Freq: Three times a day (TID) | SUBCUTANEOUS | Status: DC
  Administered 2019-04-28: 3 [IU] via SUBCUTANEOUS
  Administered 2019-04-28: 5 [IU] via SUBCUTANEOUS
  Administered 2019-04-28: 2 [IU] via SUBCUTANEOUS
  Administered 2019-04-29: 3 [IU] via SUBCUTANEOUS
  Administered 2019-04-29: 5 [IU] via SUBCUTANEOUS
  Administered 2019-04-29 – 2019-04-30 (×2): 3 [IU] via SUBCUTANEOUS
  Administered 2019-04-30: 8 [IU] via SUBCUTANEOUS
  Administered 2019-05-01 – 2019-05-02 (×2): 3 [IU] via SUBCUTANEOUS

## 2019-04-27 MED ORDER — OXYCODONE-ACETAMINOPHEN 7.5-325 MG PO TABS
1.0000 | ORAL_TABLET | Freq: Three times a day (TID) | ORAL | Status: DC | PRN
  Administered 2019-04-28 – 2019-05-02 (×9): 1 via ORAL
  Filled 2019-04-27 (×9): qty 1

## 2019-04-27 MED ORDER — MAGNESIUM SULFATE 2 GM/50ML IV SOLN
2.0000 g | Freq: Once | INTRAVENOUS | Status: AC
  Administered 2019-04-28: 2 g via INTRAVENOUS
  Filled 2019-04-27: qty 50

## 2019-04-27 MED ORDER — INSULIN GLARGINE 100 UNIT/ML ~~LOC~~ SOLN
30.0000 [IU] | Freq: Every day | SUBCUTANEOUS | Status: DC
  Administered 2019-04-28: 30 [IU] via SUBCUTANEOUS
  Filled 2019-04-27 (×3): qty 0.3

## 2019-04-27 MED ORDER — INSULIN ASPART 100 UNIT/ML ~~LOC~~ SOLN: 0.0000 [IU] | Freq: Every day | SUBCUTANEOUS | Status: DC

## 2019-04-27 MED ORDER — POTASSIUM CHLORIDE CRYS ER 20 MEQ PO TBCR
20.0000 meq | EXTENDED_RELEASE_TABLET | Freq: Two times a day (BID) | ORAL | Status: DC
  Administered 2019-04-28 – 2019-05-02 (×10): 20 meq via ORAL
  Filled 2019-04-27 (×10): qty 1

## 2019-04-27 MED ORDER — ACETAMINOPHEN 325 MG PO TABS: 650.0000 mg | ORAL_TABLET | ORAL | Status: DC | PRN

## 2019-04-27 NOTE — Telephone Encounter (Signed)
Pt c/o swelling: STAT is pt has developed SOB within 24 hours  1) How much weight have you gained and in what time span? 5 lbs in one week  2) If swelling, where is the swelling located? +2 Right Lower Leg & +1 in Left Lower Leg   3) Are you currently taking a fluid pill? Yes  4) Are you currently SOB? Yes, started a month ago   5) Do you have a log of your daily weights (if so, list)? No  6) Have you gained 3 pounds in a day or 5 pounds in a week? Yes  7) Have you traveled recently? No   Jessica with [facility] would like the nurse to give her a call back.

## 2019-04-27 NOTE — Telephone Encounter (Signed)
Returned call to Curtis.She stated she is presently at patient's home.Stated she has gained 5 lbs within 1 week.Stated she has increased swelling in both lower legs, 2+ in right lower leg and 1+ in left lower leg.Stated she is sob hard to walk from room to room.Stated she has been taking Metolazone 5 mg daily for the past 1 month and PCP changed her to Furosemide 40 mg twice a day for the past 3 weeks.Spoke to DOD Dr.Harding he advised she needs to go to ED.Stated her husband will take her to [facility] ED.

## 2019-04-27 NOTE — H&P (Signed)
History and Physical    Denise Freeman DOB: 13-Dec-1950 DOA: 04/27/2019  PCP: Celene Squibb, MD   Patient coming from: Home   Chief Complaint: Increasing swelling and SOB   HPI: Denise Freeman is a 68 y.o. female with medical history significant for chronic combined systolic and diastolic CHF, atrial fibrillation on warfarin, COPD with chronic hypoxic respiratory failure, depression with anxiety, insulin-dependent diabetes mellitus, and chronic kidney disease stage III, now presenting to the emergency department for evaluation of progressive shortness of breath, leg swelling, and weight gain.  Patient reports that her diuretics were adjusted when she was discharged from the hospital 3 months ago, she does not feel like her respiratory status ever returned to her previous baseline since that admission, reports multiple adjustments to her medications in the interim, but reports progressive dyspnea, weight gain, and swelling, particularly over the past few days.  She denies any chest pain, fevers, chills, wheezing, or any significant coughing.  ED Course: Upon arrival to the ED, patient is found to be afebrile, saturating mid 90s on her usual 3 L/min of supplemental oxygen, and with stable blood pressure.  EKG features atrial fibrillation with PVCs and QTc interval of 523 ms.  Chest x-ray is notable for mild cardiomegaly without overt edema.  Chemistry panel features a BUN of 62 and creatinine 1.22, similar to priors.  CBC is notable for mild leukocytosis.  INR is therapeutic.  BNP is elevated to 318.  High-sensitivity troponin is normal x2.  Patient was given 80 mg IV Lasix in the ED.  COVID-19 screening test is pending.  Review of Systems:  All other systems reviewed and apart from HPI, are negative.  Past Medical History:  Diagnosis Date  . Allergy   . Anemia   . Anxiety   . Asthma   . Atrial fibrillation ([place])   . Bipolar 1 disorder ([place])   . 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 Cholelithiasis    a. 09/2016 s/p Lap Chole.  . Chronic bronchitis ([place])   . Chronic combined systolic and diastolic congestive heart failure ([place])    a. 09/2016 Echo: EF 30-35%.  . Colon polyps   . COPD (chronic obstructive pulmonary disease) ([place])   . Depression   . Family history of adverse reaction to anesthesia    Uncle was positive for malignant hyperthermia; patient had testing done and was negative.  . Fibromyalgia   . GERD (gastroesophageal reflux disease)   . Gout   . High cholesterol   . History of blood transfusion 06/2015   "bleeding from my rectum"  . History of hiatal hernia   . HOH (hard of hearing)   . Hx of colonic polyps 03/21/2016   3 small adenomas no recall - co-morbidities  . Neuropathy    Disc Back   . NICM (nonischemic cardiomyopathy) ([place])    a. Previously worked up in [place], [place]-->low EF with subsequent recovery.  Multiple caths (last ~ 2014 per pt report)--reportedly nl cors;  b. 09/2016 Echo: EF 30-35%, antsept/apical HK, mild MR, mildly dil LA, mod dil RA;  c. 09/2016 Lexi MV: EF 26%, glob HK, sept DK, med size, mod intensity fixed septal defect - BBB/PVC related artifact, no ischemia.  . On home oxygen therapy    "3L; 24/7" (03/14/2016)  . OSA treated with BiPAP    uses biPAP, 10 (03/14/2016)  . Osteoarthritis   . Oxygen deficiency   . Pneumonia   .  Type II diabetes mellitus ([place])     Past Surgical History:  Procedure Laterality Date  . APPENDECTOMY     "they busted"  . bladder stimulator     pt states, "it cannot be turned off; it's in my right hip; dead battery so it's not working anymore". (03/14/2016)  . BLADDER SUSPENSION     2003, 2006 and 2010  . CATARACT EXTRACTION W/PHACO Right 11/29/2014   Procedure: CATARACT EXTRACTION PHACO AND INTRAOCULAR LENS PLACEMENT (IOC);  Surgeon: Rutherford Guys, MD;  Location: [facility];  Service: Ophthalmology;  Laterality: Right;  CDE:3.81  . CATARACT EXTRACTION W/PHACO Left 12/13/2014    Procedure: CATARACT EXTRACTION PHACO AND INTRAOCULAR LENS PLACEMENT (IOC);  Surgeon: Rutherford Guys, MD;  Location: [facility];  Service: Ophthalmology;  Laterality: Left;  CDE:6.59  . CERVICAL DISC SURGERY N/A 2009   4, 6, and 7 cervical disc replaced  . CHOLECYSTECTOMY N/A 09/13/2016   Procedure: LAPAROSCOPIC CHOLECYSTECTOMY;  Surgeon: Rolm Bookbinder, MD;  Location: [facility];  Service: General;  Laterality: N/A;  . COLONOSCOPY WITH PROPOFOL N/A 03/15/2016   Procedure: COLONOSCOPY WITH PROPOFOL;  Surgeon: Gatha Mayer, MD;  Location: [facility];  Service: Endoscopy;  Laterality: N/A;  . ESOPHAGOGASTRODUODENOSCOPY (EGD) WITH PROPOFOL N/A 03/15/2016   Procedure: ESOPHAGOGASTRODUODENOSCOPY (EGD) WITH PROPOFOL;  Surgeon: Gatha Mayer, MD;  Location: [facility];  Service: Endoscopy;  Laterality: N/A;  . HEEL SPUR SURGERY Bilateral   . HERNIA REPAIR    . I&D EXTREMITY Right 06/13/2015   Procedure: MINOR IRRIGATION AND DEBRIDEMENT EXTREMITY REMOVAL OF NAIL;  Surgeon: Daryll Brod, MD;  Location: [facility];  Service: Orthopedics;  Laterality: Right;  . TUBAL LIGATION    . UMBILICAL HERNIA REPAIR     w/mesh     reports that she quit smoking about 30 years ago. Her smoking use included cigarettes. She has a 28.00 pack-year smoking history. She has never used smokeless tobacco. She reports that she does not drink alcohol or use drugs.  Allergies  Allergen Reactions  . Xarelto [Rivaroxaban] Other (See Comments)    Internal bleeding  . Ancef [Cefazolin] Nausea And Vomiting  . Levaquin [Levofloxacin In D5w] Other (See Comments)    "afib"  . Lyrica [Pregabalin] Hives  . Tamiflu [Oseltamivir Phosphate] Other (See Comments)    "water blisters"  . Zoloft [Sertraline Hcl] Other (See Comments)    Jaw problems, jittery  . Augmentin [Amoxicillin-Pot Clavulanate] Itching  . Ciprofloxacin Itching and Nausea And Vomiting  . Haldol [Haloperidol] Other (See Comments)    Restless leg  . Nsaids  Diarrhea  . Penicillins Itching, Nausea And Vomiting and Rash    Has patient had a PCN reaction causing immediate rash, facial/tongue/throat swelling, SOB or lightheadedness with hypotension: Yes Has patient had a PCN reaction causing severe rash involving mucus membranes or skin necrosis: No Has patient had a PCN reaction that required hospitalization No Has patient had a PCN reaction occurring within the last 10 years: Yes If all of the above answers are "NO", then may proceed with Cephalosporin use.  . Topamax [Topiramate] Nausea Only    Family History  Problem Relation Age of Onset  . Heart disease Mother   . COPD Mother   . Diabetes Mother   . Breast cancer Mother   . Heart disease Father   . Hyperlipidemia Father   . COPD Sister   . Heart disease Sister   . Diabetes Sister   . Heart disease Maternal Grandmother   . Cancer  Maternal Grandmother        stomach  . Cancer Maternal Grandfather        lung   . Bipolar disorder Brother      Prior to Admission medications   Medication Sig Start Date End Date Taking? Authorizing Provider  albuterol (PROVENTIL) (2.5 MG/3ML) 0.083% nebulizer solution USE (1) IN NEBULIZER EVERY 6 HOURS AS NEEDED FOR SHORTNESS OF BREATH. 10/12/18   Chesley Mires, MD  allopurinol (ZYLOPRIM) 100 MG tablet TAKE 1 TABLET BY MOUTH ONCE DAILY. 12/08/17   Lucila Maine C, DO  budesonide (PULMICORT) 0.5 MG/2ML nebulizer solution Take 2 mLs (0.5 mg total) by nebulization 2 (two) times daily. 04/14/19   Chesley Mires, MD  cetirizine (ZYRTEC) 10 MG tablet Take 1 tablet (10 mg total) by mouth at bedtime. 01/30/17   Rogue Bussing, MD  cholecalciferol (VITAMIN D) 400 units TABS tablet Take 1 tablet (400 Units total) by mouth 2 (two) times daily. 07/17/17   Rogue Bussing, MD  clobetasol cream (TEMOVATE) 0.05 %  01/28/19   [provider]  colchicine 0.6 MG tablet Take 1 tablet (0.6 mg total) by mouth as needed. 01/25/19   Kathie Dike, MD   escitalopram (LEXAPRO) 10 MG tablet Take 1 tablet (10 mg total) by mouth at bedtime. 10/24/17   Norman Clay, MD  FEROSUL 325 (65 Fe) MG tablet Take 1 tablet by mouth daily. 01/08/19   [provider]  fluticasone (FLONASE) 50 MCG/ACT nasal spray Place 1 spray into both nostrils 2 (two) times daily.    [provider]  furosemide (LASIX) 40 MG tablet Take 1 tablet (40 mg total) by mouth 2 (two) times daily. 04/27/19   Hilty, Nadean Corwin, MD  Insulin Glargine (BASAGLAR KWIKPEN) 100 UNIT/ML SOPN Inject 0.6 mLs (60 Units total) into the skin at bedtime. 11/12/17   Cassandria Anger, MD  insulin lispro (HUMALOG KWIKPEN) 100 UNIT/ML KiwkPen Inject 0.1-0.16 mLs (10-16 Units total) into the skin 3 (three) times daily before meals. 11/12/17   Cassandria Anger, MD  Insulin Pen Needle 32G X 4 MM MISC Use to inject insulin 5 times daily 03/27/18   Philemon Kingdom, MD  ipratropium (ATROVENT) 0.02 % nebulizer solution USE (1) IN NEBULIZER EVERY 6 HOURS AS NEEDED FOR SHORTNESS OF BREATH. 10/12/18   Chesley Mires, MD  losartan (COZAAR) 25 MG tablet TAKE 1 TABLET BY MOUTH ONCE DAILY. 03/11/19   Hilty, Nadean Corwin, MD  metolazone (ZAROXOLYN) 5 MG tablet TAKE 1 TABLET BY MOUTH ONCE DAILY AS NEEDED FOR EDEMA. * TAKE 30 MINUTES BEFORE torsemide* 02/24/19   Hilty, Nadean Corwin, MD  metoprolol succinate (TOPROL-XL) 25 MG 24 hr tablet TAKE 1 TABLET BY MOUTH TWICE DAILY 03/11/19   Hilty, Nadean Corwin, MD  mirtazapine (REMERON) 15 MG tablet Take 1 tablet (15 mg total) by mouth at bedtime. 10/24/17   Norman Clay, MD  Misc Natural Products (TART CHERRY ADVANCED PO) Take by mouth daily.    [provider]  modafinil (PROVIGIL) 200 MG tablet Take 1 tablet (200 mg total) by mouth daily. 03/24/18   Chesley Mires, MD  mometasone (NASONEX) 50 MCG/ACT nasal spray Place 1 spray into the nose daily. 01/10/17   Chesley Mires, MD  montelukast (SINGULAIR) 10 MG tablet TAKE ONE TABLET BY MOUTH AT BEDTIME. 03/11/19   Chesley Mires, MD  mupirocin cream (BACTROBAN) 2 % Apply 1 application topically 2 (two) times daily. 11/27/17   Rogue Bussing, MD  nitroGLYCERIN (NITROSTAT) 0.4 MG SL  tablet Place 0.4 mg under the tongue every 5 (five) minutes as needed for chest pain.    [provider]  nystatin (NYSTATIN) powder Apply topically 2 (two) times daily. Apply under breasts 10/09/16   Rogue Bussing, MD  ondansetron (ZOFRAN) 4 MG tablet Take 4 mg by mouth once.    [provider]  oxyCODONE-acetaminophen (PERCOCET/ROXICET) 5-325 MG tablet Take 1 tablet by mouth 2 (two) times daily as needed for severe pain. 11/27/17   Rogue Bussing, MD  pantoprazole (PROTONIX) 20 MG tablet TAKE (1) TABLET BY MOUTH TWICE A DAY BEFORE MEALS. (BREAKFAST AND SUPPER) 12/02/17   Lucila Maine C, DO  potassium chloride (K-DUR) 10 MEQ tablet Take 1 tablet (10 mEq total) by mouth daily. 02/24/19   Hilty, Nadean Corwin, MD  predniSONE (DELTASONE) 5 MG tablet Take 1 tablet (5 mg total) by mouth daily with breakfast. 11/07/17   Chesley Mires, MD  primidone (MYSOLINE) 50 MG tablet TAKE ONE TABLET BY MOUTH AT BEDTIME. 11/10/17   Rogue Bussing, MD  Probiotic Product (PROBIOTIC PO) Take 1 tablet by mouth at bedtime.    [provider]  triamcinolone lotion (KENALOG) 0.1 % Apply 1 application topically 3 (three) times daily. For up to 14 days and as needed 01/26/16   Martinique, Betty G, MD  TRUE [facility] BLOOD GLUCOSE TEST test strip USE TO TEST BLOOD SUGAR AS DIRECTED. 09/24/17   Cassandria Anger, MD  Vitamin A 2400 MCG (8000 UT) CAPS Take 8,000 Units by mouth every morning.     [provider]  warfarin (COUMADIN) 4 MG tablet TAKE 1 TABLET BY MOUTH ONCE DAILY EXCEPT ON THURSDAY TAKE 1/2 TABLET 03/11/19   Pixie Casino, MD    Physical Exam: Vitals:   04/27/19 1612 04/27/19 1617 04/27/19 2130  BP: 105/74  117/84  Pulse: 86  64  Resp: 20  16  Temp: 98.9 F (37.2 C)    TempSrc: Oral     SpO2: 100%  93%  Weight:  91.4 kg   Height:  4\' 11"  (5.170 m)     Constitutional: NAD, calm  Eyes: PERTLA, lids and conjunctivae normal ENMT: Mucous membranes are moist. Posterior pharynx clear of any exudate or lesions.   Neck: normal, supple, no masses, no thyromegaly Respiratory: no wheezing, no cough. No accessory muscle use.  Cardiovascular: Rate ~60 and irregularly irregular. Pretibial pitting edema bilaterally. Abdomen: No distension, no tenderness, soft. Bowel sounds active.  Musculoskeletal: no clubbing / cyanosis. No joint deformity upper and lower extremities.  Skin: no significant rashes, lesions, ulcers. Warm, dry, well-perfused. Neurologic: No facial asymmetry. Sensation intact. Moving all extremities.  Psychiatric: Alert and oriented to person, place, and situation. Calm, cooperative.     Labs on Admission: I have personally reviewed following labs and imaging studies  CBC: Recent Labs  Lab 04/27/19 1716  WBC 10.9*  HGB 11.3*  HCT 37.9  MCV 96.7  PLT 017   Basic Metabolic Panel: Recent Labs  Lab 04/27/19 1716  NA 135  K 3.8  CL 91*  CO2 32  GLUCOSE 255*  BUN 62*  CREATININE 1.22*  CALCIUM 8.2*   GFR: Estimated Creatinine Clearance: 43.5 mL/min (A) (by C-G formula based on SCr of 1.22 mg/dL (H)). Liver Function Tests: No results for input(s): AST, ALT, ALKPHOS, BILITOT, PROT, ALBUMIN in the last 168 hours. No results for input(s): LIPASE, AMYLASE in the last 168 hours. No results for input(s): AMMONIA in the last 168 hours. Coagulation Profile:  Recent Labs  Lab 04/27/19 1716  INR 2.7*   Cardiac Enzymes: No results for input(s): CKTOTAL, CKMB, CKMBINDEX, TROPONINI in the last 168 hours. BNP (last 3 results) No results for input(s): PROBNP in the last 8760 hours. HbA1C: No results for input(s): HGBA1C in the last 72 hours. CBG: No results for input(s): GLUCAP in the last 168 hours. Lipid Profile: No results for input(s): CHOL, HDL, LDLCALC,  TRIG, CHOLHDL, LDLDIRECT in the last 72 hours. Thyroid Function Tests: No results for input(s): TSH, T4TOTAL, FREET4, T3FREE, THYROIDAB in the last 72 hours. Anemia Panel: No results for input(s): VITAMINB12, FOLATE, FERRITIN, TIBC, IRON, RETICCTPCT in the last 72 hours. Urine analysis:    Component Value Date/Time   COLORURINE YELLOW 09/07/2016 1413   APPEARANCEUR HAZY (A) 09/07/2016 1413   LABSPEC 1.008 09/07/2016 1413   PHURINE 7.0 09/07/2016 1413   GLUCOSEU NEGATIVE 09/07/2016 1413   HGBUR NEGATIVE 09/07/2016 1413   BILIRUBINUR NEGATIVE 09/07/2016 1413   KETONESUR NEGATIVE 09/07/2016 1413   PROTEINUR 30 (A) 09/07/2016 1413   UROBILINOGEN 1.0 02/20/2015 1855   NITRITE NEGATIVE 09/07/2016 1413   LEUKOCYTESUR MODERATE (A) 09/07/2016 1413   Sepsis Labs: @LABRCNTIP (procalcitonin:4,lacticidven:4) )No results found for this or any previous visit (from the past 240 hour(s)).   Radiological Exams on Admission: Dg Chest 2 View  Result Date: 04/27/2019 CLINICAL DATA:  Shortness of breath and weight gain EXAM: CHEST - 2 VIEW COMPARISON:  01/22/2019 FINDINGS: Mild cardiomegaly. No overt pulmonary edema. No pneumothorax or pleural effusion. No focal airspace consolidation. IMPRESSION: Mild cardiomegaly without overt pulmonary edema. Electronically Signed   By: Ulyses Jarred M.D.   On: 04/27/2019 19:39    EKG: Independently reviewed. Atrial fibrillation, PVC's, QTc 523 ms.   Assessment/Plan   1. Acute on chronic combined systolic & diastolic CHF  - Presents with insidiously worsening SOB, leg swelling, and orthopnea despite multiple adjustments to her diuretics  - EF was 40-45% in December 2019 with diffuse HK, grade 3 diastolic dysfunction, mild MR, moderate LAE, and severe RAE  - She was treated with Lasix 80 mg IV in ED  - Continue diuresis with Lasix 60 mg IV q12h, fluid-restrict diet, follow daily wt and I/O's    2. COPD; chronic hypoxic & hypercarbic respiratory failure  - No  cough or wheezing on admission  - Continue supplemental O2, hold inhalers initially given prolonged QT interval and no cough or wheeze    3. Insulin-dependent DM  - No recent A1c on file  - Check CBG's and continue basal and short-acting insulins    4. CKD III  - SCr is 1.22 on admission, similar to priors  - Renally-dose medications, follow daily chem panel during diuresis     5. Atrial fibrillation  - In rate-controlled a fib in ED  - CHADS-VASc at least 4 (age, gender, CHF, DM)  - Continue warfarin   6. Chronic pain  - Continue home-regimen with as-needed Percocet   7. Prolonged QT interval  - QTc is 523 ms on admission  - Continue cardiac monitoring, avoid QT-prolonging medications, replace potassium to 4 and mag to 2, repeat EKG in am     PPE: Mask, face shield  DVT prophylaxis: warfarin  Code Status: Full, confirmed with patient  Family Communication: Discussed with patient  Consults called: None  Admission status: Observation     Vianne Bulls, MD [facility] Pager [phone]  If 7PM-7AM, please contact night-coverage [email or web address] Password [facility]  04/27/2019, 11:26 PM

## 2019-04-27 NOTE — Progress Notes (Addendum)
ANTICOAGULATION CONSULT NOTE - Initial Consult  Pharmacy Consult for warfarin dosing Indication: atrial fibrillation  Home dose: 4mg  daily except for 2mg  on Sunday/Thursday INR: 2.7 Drug interactions: n/a Concurrent anti-platelet drugs: n/a  Allergies  Allergen Reactions  . Xarelto [Rivaroxaban] Other (See Comments)    Internal bleeding  . Ancef [Cefazolin] Nausea And Vomiting  . Levaquin [Levofloxacin In D5w] Other (See Comments)    "afib"  . Lyrica [Pregabalin] Hives  . Tamiflu [Oseltamivir Phosphate] Other (See Comments)    "water blisters"  . Zoloft [Sertraline Hcl] Other (See Comments)    Jaw problems, jittery  . Augmentin [Amoxicillin-Pot Clavulanate] Itching  . Ciprofloxacin Itching and Nausea And Vomiting  . Haldol [Haloperidol] Other (See Comments)    Restless leg  . Nsaids Diarrhea  . Penicillins Itching, Nausea And Vomiting and Rash    Has patient had a PCN reaction causing immediate rash, facial/tongue/throat swelling, SOB or lightheadedness with hypotension: Yes Has patient had a PCN reaction causing severe rash involving mucus membranes or skin necrosis: No Has patient had a PCN reaction that required hospitalization No Has patient had a PCN reaction occurring within the last 10 years: Yes If all of the above answers are "NO", then may proceed with Cephalosporin use.  . Topamax [Topiramate] Nausea Only    Patient Measurements: Height: 4\' 11"  (149.9 cm) Weight: 201 lb 8 oz (91.4 kg) IBW/kg (Calculated) : 43.2 Heparin Dosing Weight: HEPARIN DW (KG): 65.2  Vital Signs: Temp: 98.9 F (37.2 C) (11/24 1612) Temp Source: Oral (11/24 1612) BP: 117/84 (11/24 2130) Pulse Rate: 64 (11/24 2130)  Labs: Recent Labs    04/27/19 1716 04/27/19 2120  HGB 11.3*  --   HCT 37.9  --   PLT 213  --   LABPROT 28.7*  --   INR 2.7*  --   CREATININE 1.22*  --   TROPONINIHS 11 12    Estimated Creatinine Clearance: 43.5 mL/min (A) (by C-G formula based on SCr of 1.22  mg/dL (H)).   Assessment: Pharmacy consulted to dose warfarin for this 68 yo female  on chronic warfarin therapy with atrial fibrillation.  She had internal bleeding in the past with Xarelto.  CBC is within normal limits, though Hb is low at 11.3g/dL.    Home dose:   4mg  daily except for 2mg  on Thursdays/Sundays  Last dose: warfarin 2mg  on 04-27-19   Goal of Therapy:  INR 2-3 Monitor platelets by anticoagulation protocol: Yes   Plan:  No warfarin today-->INR 2.7 and patient has taken dose today Daily INR and every other day CBC Monitor for signs and symptoms of bleeding   Despina Pole, Pharm. D. Clinical Pharmacist 04/27/2019 11:59 PM

## 2019-04-27 NOTE — ED Triage Notes (Signed)
PT states has gained 5 lbs this week and became more SOB with exertion. Oxygen sats are 100% on 3L n/c she wears continuously. PT states she was admitted about a month ago for a COPD and CHF.

## 2019-04-27 NOTE — ED Provider Notes (Signed)
[facility] EMERGENCY DEPARTMENT Provider Note   CSN: [id number] Arrival date & time: 04/27/19  1527     History   Chief Complaint Chief Complaint  Patient presents with  . Shortness of Breath    HPI Denise Freeman is a 68 y.o. female with PMHx COPD, Chronic home O2 3L, CHF with EF 40-45% (05/08/2018), GERD, Fibromyalgia, Diabetes, HTN, A fib on anticoagulation, who presents to the ED today complaining of gradual onset, constant, worsening, shortness of breath x 3 months.  She reports she was admitted to the hospital at end of August for COPD/congestive heart failure exacerbation and since being discharged on 8/24 she has continued to be short of breath.  Patient states that after hospitalization her Lasix was changed to torsemide 60 mg twice daily and mid to low zone was increased from 2.5-5 mg daily.  Patient reports that the torsemide was not helping her shortness of breath and leg swelling.  She reports that about 3 weeks ago her PCP changed her from torsemide to Lasix 40 mg twice daily without any improvement.  She reports that she typically weighs 201 pounds but was weighed today and was 205.  [name] home health nurse called cardiologist office today was advised to come to the ED for further evaluation.  Patient denies fever, chills, chest pain, abdominal pain, nausea, vomiting, any other associated symptoms.       Past Medical History:  Diagnosis Date  . Allergy   . Anemia   . Anxiety   . Asthma   . Atrial fibrillation ([place])   . Bipolar 1 disorder ([place])   . 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 Cholelithiasis    a. 09/2016 s/p Lap Chole.  . Chronic bronchitis ([place])   . Chronic combined systolic and diastolic congestive heart failure ([place])    a. 09/2016 Echo: EF 30-35%.  . Colon polyps   . COPD (chronic obstructive pulmonary disease) ([place])   . Depression   . Family history of adverse reaction to anesthesia    Uncle was positive for malignant hyperthermia;  patient had testing done and was negative.  . Fibromyalgia   . GERD (gastroesophageal reflux disease)   . Gout   . High cholesterol   . History of blood transfusion 06/2015   "bleeding from my rectum"  . History of hiatal hernia   . HOH (hard of hearing)   . Hx of colonic polyps 03/21/2016   3 small adenomas no recall - co-morbidities  . Neuropathy    Disc Back   . NICM (nonischemic cardiomyopathy) ([place])    a. Previously worked up in [place], [place]-->low EF with subsequent recovery.  Multiple caths (last ~ 2014 per pt report)--reportedly nl cors;  b. 09/2016 Echo: EF 30-35%, antsept/apical HK, mild MR, mildly dil LA, mod dil RA;  c. 09/2016 Lexi MV: EF 26%, glob HK, sept DK, med size, mod intensity fixed septal defect - BBB/PVC related artifact, no ischemia.  . On home oxygen therapy    "3L; 24/7" (03/14/2016)  . OSA treated with BiPAP    uses biPAP, 10 (03/14/2016)  . Osteoarthritis   . Oxygen deficiency   . Pneumonia   . Type II diabetes mellitus [facility])     Patient Active Problem List   Diagnosis Date Noted  . Prolonged QT interval 04/27/2019  . CHF exacerbation ([place]) 01/24/2019  . Acute exacerbation of CHF (congestive heart failure) ([place]) 01/23/2019  . Gout 11/16/2017  . Chronic kidney disease  11/16/2017  . Uncontrolled type 2 diabetes mellitus with hyperglycemia ([place]) 11/12/2017  . Pain of left lower extremity 10/24/2017  . Medically complex patient 10/03/2017  . Chronic pain syndrome 10/03/2017  . Unstable gait 10/03/2017  . Diarrhea 07/29/2017  . Type 2 diabetes mellitus with stage 3 chronic kidney disease, with long-term current use of insulin ([place]) 04/01/2017  . Essential hypertension, benign 04/01/2017  . Mood disorder in conditions classified elsewhere 03/31/2017  . Chronic diastolic congestive heart failure ([place]) 03/12/2017  . Chronic systolic CHF (congestive heart failure) ([place]) 11/05/2016  . Pressure injury of skin 09/09/2016  . NICM (nonischemic cardiomyopathy)  ([place])   . Class 2 severe obesity due to excess calories with serious comorbidity and body mass index (BMI) of 38.0 to 38.9 in adult [facility])   . Acute systolic congestive heart failure ([place])   . S/P laparoscopic cholecystectomy   . Heart failure ([place]) 09/06/2016  . Dyslipidemia 08/07/2016  . Vision loss 04/19/2016  . Hx of colonic polyps 03/21/2016  . Blood in stool   . Benign neoplasm of cecum   . Benign neoplasm of rectum   . CKD (chronic kidney disease), stage III ([place]) 03/14/2016  . Mixed hyperlipidemia 03/14/2016  . Scalp lesion 03/14/2016  . Peripheral polyneuropathy 01/26/2016  . Vitamin D deficiency 09/26/2015  . DM type 2 causing vascular disease ([place]) 08/28/2015  . Chronic upper GI bleeding 06/24/2015  . COPD with asthma ([place]) 02/22/2015  . OSA (obstructive sleep apnea) 08/22/2014  . Other emphysema ([place]) 05/29/2014  . Chronic hypoxic, hypercapnic respiratory failure 04/21/2014  . Microcytic anemia 04/21/2014  . Fibromyalgia 04/14/2014  . Uncontrolled secondary diabetes mellitus with stage 3 CKD (GFR 30-59) (H[facility]) 04/14/2014  . Dyspnea on exertion 04/14/2014  . Acute on chronic combined systolic and diastolic CHF (congestive heart failure) ([place]) 04/14/2014  . Unspecified atrial fibrillation ([place]) 04/12/2014    Past Surgical History:  Procedure Laterality Date  . APPENDECTOMY     "they busted"  . bladder stimulator     pt states, "it cannot be turned off; it's in my right hip; dead battery so it's not working anymore". (03/14/2016)  . BLADDER SUSPENSION     2003, 2006 and 2010  . CATARACT EXTRACTION W/PHACO Right 11/29/2014   Procedure: CATARACT EXTRACTION PHACO AND INTRAOCULAR LENS PLACEMENT (IOC);  Surgeon: Rutherford Guys, MD;  Location: [facility];  Service: Ophthalmology;  Laterality: Right;  CDE:3.81  . CATARACT EXTRACTION W/PHACO Left 12/13/2014   Procedure: CATARACT EXTRACTION PHACO AND INTRAOCULAR LENS PLACEMENT (IOC);  Surgeon: Rutherford Guys, MD;  Location: [facility];   Service: Ophthalmology;  Laterality: Left;  CDE:6.59  . CERVICAL DISC SURGERY N/A 2009   4, 6, and 7 cervical disc replaced  . CHOLECYSTECTOMY N/A 09/13/2016   Procedure: LAPAROSCOPIC CHOLECYSTECTOMY;  Surgeon: Rolm Bookbinder, MD;  Location: [place];  Service: General;  Laterality: N/A;  . COLONOSCOPY WITH PROPOFOL N/A 03/15/2016   Procedure: COLONOSCOPY WITH PROPOFOL;  Surgeon: Gatha Mayer, MD;  Location: [place];  Service: Endoscopy;  Laterality: N/A;  . ESOPHAGOGASTRODUODENOSCOPY (EGD) WITH PROPOFOL N/A 03/15/2016   Procedure: ESOPHAGOGASTRODUODENOSCOPY (EGD) WITH PROPOFOL;  Surgeon: Gatha Mayer, MD;  Location: [place];  Service: Endoscopy;  Laterality: N/A;  . HEEL SPUR SURGERY Bilateral   . HERNIA REPAIR    . I&D EXTREMITY Right 06/13/2015   Procedure: MINOR IRRIGATION AND DEBRIDEMENT EXTREMITY REMOVAL OF NAIL;  Surgeon: Daryll Brod, MD;  Location: [place];  Service: Orthopedics;  Laterality: Right;  .  TUBAL LIGATION    . UMBILICAL HERNIA REPAIR     w/mesh     OB History   No obstetric history on file.      Home Medications    Prior to Admission medications   Medication Sig Start Date End Date Taking? Authorizing Provider  albuterol (PROVENTIL) (2.5 MG/3ML) 0.083% nebulizer solution USE (1) IN NEBULIZER EVERY 6 HOURS AS NEEDED FOR SHORTNESS OF BREATH. 10/12/18   Chesley Mires, MD  allopurinol (ZYLOPRIM) 100 MG tablet TAKE 1 TABLET BY MOUTH ONCE DAILY. 12/08/17   Lucila Maine C, DO  budesonide (PULMICORT) 0.5 MG/2ML nebulizer solution Take 2 mLs (0.5 mg total) by nebulization 2 (two) times daily. 04/14/19   Chesley Mires, MD  cetirizine (ZYRTEC) 10 MG tablet Take 1 tablet (10 mg total) by mouth at bedtime. 01/30/17   Rogue Bussing, MD  cholecalciferol (VITAMIN D) 400 units TABS tablet Take 1 tablet (400 Units total) by mouth 2 (two) times daily. 07/17/17   Rogue Bussing, MD  clobetasol cream (TEMOVATE) 0.05 %  01/28/19   [provider]  colchicine 0.6 MG tablet Take 1 tablet (0.6 mg total) by mouth as needed. 01/25/19   Kathie Dike, MD  escitalopram (LEXAPRO) 10 MG tablet Take 1 tablet (10 mg total) by mouth at bedtime. 10/24/17   Norman Clay, MD  FEROSUL 325 (65 Fe) MG tablet Take 1 tablet by mouth daily. 01/08/19   [provider]  fluticasone (FLONASE) 50 MCG/ACT nasal spray Place 1 spray into both nostrils 2 (two) times daily.    [provider]  furosemide (LASIX) 40 MG tablet Take 1 tablet (40 mg total) by mouth 2 (two) times daily. 04/27/19   Hilty, Nadean Corwin, MD  Insulin Glargine (BASAGLAR KWIKPEN) 100 UNIT/ML SOPN Inject 0.6 mLs (60 Units total) into the skin at bedtime. 11/12/17   Cassandria Anger, MD  insulin lispro (HUMALOG KWIKPEN) 100 UNIT/ML KiwkPen Inject 0.1-0.16 mLs (10-16 Units total) into the skin 3 (three) times daily before meals. 11/12/17   Cassandria Anger, MD  Insulin Pen Needle 32G X 4 MM MISC Use to inject insulin 5 times daily 03/27/18   Philemon Kingdom, MD  ipratropium (ATROVENT) 0.02 % nebulizer solution USE (1) IN NEBULIZER EVERY 6 HOURS AS NEEDED FOR SHORTNESS OF BREATH. 10/12/18   Chesley Mires, MD  losartan (COZAAR) 25 MG tablet TAKE 1 TABLET BY MOUTH ONCE DAILY. 03/11/19   Hilty, Nadean Corwin, MD  metolazone (ZAROXOLYN) 5 MG tablet TAKE 1 TABLET BY MOUTH ONCE DAILY AS NEEDED FOR EDEMA. * TAKE 30 MINUTES BEFORE torsemide* 02/24/19   Hilty, Nadean Corwin, MD  metoprolol succinate (TOPROL-XL) 25 MG 24 hr tablet TAKE 1 TABLET BY MOUTH TWICE DAILY 03/11/19   Hilty, Nadean Corwin, MD  mirtazapine (REMERON) 15 MG tablet Take 1 tablet (15 mg total) by mouth at bedtime. 10/24/17   Norman Clay, MD  Misc Natural Products (TART CHERRY ADVANCED PO) Take by mouth daily.    [provider]  modafinil (PROVIGIL) 200 MG tablet Take 1 tablet (200 mg total) by mouth daily. 03/24/18   Chesley Mires, MD  mometasone (NASONEX) 50 MCG/ACT nasal spray Place 1 spray into the nose  daily. 01/10/17   Chesley Mires, MD  montelukast (SINGULAIR) 10 MG tablet TAKE ONE TABLET BY MOUTH AT BEDTIME. 03/11/19   Chesley Mires, MD  mupirocin cream (BACTROBAN) 2 % Apply 1 application topically 2 (two) times daily. 11/27/17   Rogue Bussing, MD  nitroGLYCERIN Delilah Shan)  0.4 MG SL tablet Place 0.4 mg under the tongue every 5 (five) minutes as needed for chest pain.    [provider]  nystatin (NYSTATIN) powder Apply topically 2 (two) times daily. Apply under breasts 10/09/16   Rogue Bussing, MD  ondansetron (ZOFRAN) 4 MG tablet Take 4 mg by mouth once.    [provider]  oxyCODONE-acetaminophen (PERCOCET/ROXICET) 5-325 MG tablet Take 1 tablet by mouth 2 (two) times daily as needed for severe pain. 11/27/17   Rogue Bussing, MD  pantoprazole (PROTONIX) 20 MG tablet TAKE (1) TABLET BY MOUTH TWICE A DAY BEFORE MEALS. (BREAKFAST AND SUPPER) 12/02/17   Lucila Maine C, DO  potassium chloride (K-DUR) 10 MEQ tablet Take 1 tablet (10 mEq total) by mouth daily. 02/24/19   Hilty, Nadean Corwin, MD  predniSONE (DELTASONE) 5 MG tablet Take 1 tablet (5 mg total) by mouth daily with breakfast. 11/07/17   Chesley Mires, MD  primidone (MYSOLINE) 50 MG tablet TAKE ONE TABLET BY MOUTH AT BEDTIME. 11/10/17   Rogue Bussing, MD  Probiotic Product (PROBIOTIC PO) Take 1 tablet by mouth at bedtime.    [provider]  triamcinolone lotion (KENALOG) 0.1 % Apply 1 application topically 3 (three) times daily. For up to 14 days and as needed 01/26/16   [place]ique, Betty G, MD  TRUE [facility] BLOOD GLUCOSE TEST test strip USE TO TEST BLOOD SUGAR AS DIRECTED. 09/24/17   Cassandria Anger, MD  Vitamin A 2400 MCG (8000 UT) CAPS Take 8,000 Units by mouth every morning.     [provider]  warfarin (COUMADIN) 4 MG tablet TAKE 1 TABLET BY MOUTH ONCE DAILY EXCEPT ON THURSDAY TAKE 1/2 TABLET 03/11/19   Hilty, Nadean Corwin, MD    Family History Family History  Problem  Relation Age of Onset  . Heart disease Mother   . COPD Mother   . Diabetes Mother   . Breast cancer Mother   . Heart disease Father   . Hyperlipidemia Father   . COPD Sister   . Heart disease Sister   . Diabetes Sister   . Heart disease Maternal Grandmother   . Cancer Maternal Grandmother        stomach  . Cancer Maternal Grandfather        lung   . Bipolar disorder Brother     Social History Social History   Tobacco Use  . Smoking status: Former Smoker    Packs/day: 2.00    Years: 14.00    Pack years: 28.00    Types: Cigarettes    Quit date: 04/12/1989    Years since quitting: 30.0  . Smokeless tobacco: Never Used  Substance Use Topics  . Alcohol use: No    Alcohol/week: 0.0 standard drinks  . Drug use: No     Allergies   Xarelto [rivaroxaban], Ancef [cefazolin], Levaquin [levofloxacin in d5w], Lyrica [pregabalin], Tamiflu [oseltamivir phosphate], Zoloft [sertraline hcl], Augmentin [amoxicillin-pot clavulanate], Ciprofloxacin, Haldol [haloperidol], Nsaids, Penicillins, and Topamax [topiramate]   Review of Systems Review of Systems  Constitutional: Negative for chills and fever.  Respiratory: Positive for shortness of breath.   Cardiovascular: Positive for leg swelling (bilateral). Negative for chest pain.  Gastrointestinal: Negative for abdominal pain, nausea and vomiting.  All other systems reviewed and are negative.    Physical Exam Updated Vital Signs BP 117/84   Pulse 64   Temp 98.9 F (37.2 C) (Oral)   Resp 16   Ht 4\' 11"  (1.499 m)  Wt 91.4 kg   SpO2 93%   BMI 40.70 kg/m   Physical Exam Vitals signs and nursing note reviewed.  Constitutional:      Appearance: She is not ill-appearing or diaphoretic.  HENT:     Head: Normocephalic and atraumatic.  Eyes:     Conjunctiva/sclera: Conjunctivae normal.  Neck:     Musculoskeletal: Neck supple.  Cardiovascular:     Rate and Rhythm: Normal rate and regular rhythm.  Pulmonary:     Breath  sounds: Decreased breath sounds present. No wheezing, rhonchi or rales.     Comments: Satting 100% on 4L [place] Chest:     Chest wall: No tenderness.  Abdominal:     Palpations: Abdomen is soft.     Tenderness: There is no abdominal tenderness.  Musculoskeletal:     Right lower leg: Edema present.     Left lower leg: Edema present.     Comments: 2+ pitting edema bilaterally  Skin:    General: Skin is warm and dry.  Neurological:     Mental Status: She is alert.      ED Treatments / Results  Labs (all labs ordered are listed, but only abnormal results are displayed) Labs Reviewed  BASIC METABOLIC PANEL - Abnormal; Notable for the following components:      Result Value   Chloride 91 (*)    Glucose, Bld 255 (*)    BUN 62 (*)    Creatinine, Ser 1.22 (*)    Calcium 8.2 (*)    GFR calc non Af Amer 45 (*)    GFR calc Af Amer 53 (*)    All other components within normal limits  CBC - Abnormal; Notable for the following components:   WBC 10.9 (*)    Hemoglobin 11.3 (*)    MCHC 29.8 (*)    All other components within normal limits  BRAIN NATRIURETIC PEPTIDE - Abnormal; Notable for the following components:   B Natriuretic Peptide 318.0 (*)    All other components within normal limits  PROTIME-INR - Abnormal; Notable for the following components:   Prothrombin Time 28.7 (*)    INR 2.7 (*)    All other components within normal limits  CBG MONITORING, ED - Abnormal; Notable for the following components:   Glucose-Capillary 182 (*)    All other components within normal limits  SARS CORONAVIRUS 2 (TAT 6-24 HRS)  PROTIME-INR  CBC  BASIC METABOLIC PANEL  HEMOGLOBIN A1C  TROPONIN I (HIGH SENSITIVITY)  TROPONIN I (HIGH SENSITIVITY)    EKG EKG Interpretation  Date/Time:  Tuesday April 27 2019 21:20:52 EST Ventricular Rate:  88 PR Interval:    QRS Duration: 106 QT Interval:  432 QTC Calculation: 523 R Axis:   -111 Text Interpretation: Atrial fibrillation Ventricular  bigeminy Anterior infarct, old Prolonged QT interval No significant change was found Confirmed by Ezequiel Essex [phone]) on 04/27/2019 9:24:08 PM   Radiology Dg Chest 2 View  Result Date: 04/27/2019 CLINICAL DATA:  Shortness of breath and weight gain EXAM: CHEST - 2 VIEW COMPARISON:  01/22/2019 FINDINGS: Mild cardiomegaly. No overt pulmonary edema. No pneumothorax or pleural effusion. No focal airspace consolidation. IMPRESSION: Mild cardiomegaly without overt pulmonary edema. Electronically Signed   By: Ulyses Jarred M.D.   On: 04/27/2019 19:39    Procedures Procedures (including critical care time)  Medications Ordered in ED Medications  sodium chloride flush (NS) 0.9 % injection 3 mL (has no administration in time range)  sodium chloride flush (NS) 0.9 %  injection 3 mL (has no administration in time range)  0.9 %  sodium chloride infusion (has no administration in time range)  acetaminophen (TYLENOL) tablet 650 mg (has no administration in time range)  furosemide (LASIX) injection 60 mg (has no administration in time range)  insulin glargine (LANTUS) injection 30 Units (has no administration in time range)  insulin aspart (novoLOG) injection 0-15 Units (has no administration in time range)  insulin aspart (novoLOG) injection 0-5 Units (0 Units Subcutaneous Not Given 04/28/19 0011)  oxyCODONE-acetaminophen (PERCOCET) 7.5-325 MG per tablet 1 tablet (has no administration in time range)  potassium chloride SA (KLOR-CON) CR tablet 20 mEq (20 mEq Oral Given 04/28/19 0025)  magnesium sulfate IVPB 2 g 50 mL (2 g Intravenous New Bag/Given 04/28/19 0037)  Warfarin - Pharmacist Dosing Inpatient (has no administration in time range)  furosemide (LASIX) injection 80 mg (80 mg Intravenous Given 04/27/19 2139)     Initial Impression / Assessment and Plan / ED Course  I have reviewed the triage vital signs and the nursing notes.  Pertinent labs & imaging results that were available during my  care of the patient were reviewed by me and considered in my medical decision making (see chart for details).  68 year old female presents the ED today complaining of worsening shortness of breath since her discharge from hospital on 8/24 although progressively worsened over the past month since having torsemide changed to furosemide 40 mg twice daily.  Reports 5 pound weight gain in the past week.  On arrival patient's temps are stable.  She is afebrile without tachycardia or tachypnea.  She is normally on 3 L oxygen and is satting 100% on room air at 3 L.  She does have 2+ pitting edema bilaterally.  Chest x-ray with findings of vascular congestion and BNP elevated from baseline, will give 80 mg Lasix.  Patient to be admitted at this time for acute congestive heart failure exacerbation.  Patient may need diuretics adjusted during hospital stay.  Attending physician Dr. Juanetta Gosling evaluated patient as well and agrees with plan.   Clinical Course as of Apr 27 129  Tue Apr 27, 2019  2239 Discussed case with Dr. Myna Hidalgo who will evaluate patient   [MV]    Clinical Course User Index [MV] Eustaquio Maize, PA-C   This note was prepared using Dragon voice recognition software and may include unintentional dictation errors due to the inherent limitations of voice recognition software.       Final Clinical Impressions(s) / ED Diagnoses   Final diagnoses:  Acute on chronic congestive heart failure, unspecified heart failure type [facility])    ED Discharge Orders    None       Eustaquio Maize, PA-C 04/28/19 0131    Ezequiel Essex, MD 04/28/19 [phone]

## 2019-04-28 DIAGNOSIS — N1832 Chronic kidney disease, stage 3b: Secondary | ICD-10-CM

## 2019-04-28 DIAGNOSIS — I071 Rheumatic tricuspid insufficiency: Secondary | ICD-10-CM | POA: Diagnosis present

## 2019-04-28 DIAGNOSIS — I371 Nonrheumatic pulmonary valve insufficiency: Secondary | ICD-10-CM | POA: Diagnosis present

## 2019-04-28 DIAGNOSIS — E782 Mixed hyperlipidemia: Secondary | ICD-10-CM | POA: Diagnosis not present

## 2019-04-28 DIAGNOSIS — F0630 Mood disorder due to known physiological condition, unspecified: Secondary | ICD-10-CM | POA: Diagnosis present

## 2019-04-28 DIAGNOSIS — I4821 Permanent atrial fibrillation: Secondary | ICD-10-CM

## 2019-04-28 DIAGNOSIS — E1122 Type 2 diabetes mellitus with diabetic chronic kidney disease: Secondary | ICD-10-CM | POA: Diagnosis present

## 2019-04-28 DIAGNOSIS — H542X11 Low vision right eye category 1, low vision left eye category 1: Secondary | ICD-10-CM | POA: Diagnosis not present

## 2019-04-28 DIAGNOSIS — J9611 Chronic respiratory failure with hypoxia: Secondary | ICD-10-CM | POA: Diagnosis not present

## 2019-04-28 DIAGNOSIS — I10 Essential (primary) hypertension: Secondary | ICD-10-CM

## 2019-04-28 DIAGNOSIS — E1140 Type 2 diabetes mellitus with diabetic neuropathy, unspecified: Secondary | ICD-10-CM | POA: Diagnosis present

## 2019-04-28 DIAGNOSIS — Z9989 Dependence on other enabling machines and devices: Secondary | ICD-10-CM | POA: Diagnosis not present

## 2019-04-28 DIAGNOSIS — J9621 Acute and chronic respiratory failure with hypoxia: Secondary | ICD-10-CM | POA: Diagnosis present

## 2019-04-28 DIAGNOSIS — M1990 Unspecified osteoarthritis, unspecified site: Secondary | ICD-10-CM | POA: Diagnosis not present

## 2019-04-28 DIAGNOSIS — G894 Chronic pain syndrome: Secondary | ICD-10-CM | POA: Diagnosis present

## 2019-04-28 DIAGNOSIS — J449 Chronic obstructive pulmonary disease, unspecified: Secondary | ICD-10-CM

## 2019-04-28 DIAGNOSIS — E785 Hyperlipidemia, unspecified: Secondary | ICD-10-CM | POA: Diagnosis present

## 2019-04-28 DIAGNOSIS — M797 Fibromyalgia: Secondary | ICD-10-CM | POA: Diagnosis not present

## 2019-04-28 DIAGNOSIS — D72829 Elevated white blood cell count, unspecified: Secondary | ICD-10-CM | POA: Diagnosis present

## 2019-04-28 DIAGNOSIS — J9622 Acute and chronic respiratory failure with hypercapnia: Secondary | ICD-10-CM | POA: Diagnosis present

## 2019-04-28 DIAGNOSIS — G4733 Obstructive sleep apnea (adult) (pediatric): Secondary | ICD-10-CM | POA: Diagnosis present

## 2019-04-28 DIAGNOSIS — J438 Other emphysema: Secondary | ICD-10-CM | POA: Diagnosis present

## 2019-04-28 DIAGNOSIS — Z6841 Body Mass Index (BMI) 40.0 and over, adult: Secondary | ICD-10-CM | POA: Diagnosis not present

## 2019-04-28 DIAGNOSIS — E559 Vitamin D deficiency, unspecified: Secondary | ICD-10-CM | POA: Diagnosis not present

## 2019-04-28 DIAGNOSIS — I130 Hypertensive heart and chronic kidney disease with heart failure and stage 1 through stage 4 chronic kidney disease, or unspecified chronic kidney disease: Secondary | ICD-10-CM | POA: Diagnosis present

## 2019-04-28 DIAGNOSIS — R9431 Abnormal electrocardiogram [ECG] [EKG]: Secondary | ICD-10-CM | POA: Diagnosis not present

## 2019-04-28 DIAGNOSIS — R0602 Shortness of breath: Secondary | ICD-10-CM | POA: Diagnosis not present

## 2019-04-28 DIAGNOSIS — J9612 Chronic respiratory failure with hypercapnia: Secondary | ICD-10-CM | POA: Diagnosis not present

## 2019-04-28 DIAGNOSIS — I5033 Acute on chronic diastolic (congestive) heart failure: Secondary | ICD-10-CM | POA: Diagnosis not present

## 2019-04-28 DIAGNOSIS — M1030 Gout due to renal impairment, unspecified site: Secondary | ICD-10-CM | POA: Diagnosis not present

## 2019-04-28 DIAGNOSIS — N183 Chronic kidney disease, stage 3 unspecified: Secondary | ICD-10-CM | POA: Diagnosis not present

## 2019-04-28 DIAGNOSIS — I5043 Acute on chronic combined systolic (congestive) and diastolic (congestive) heart failure: Secondary | ICD-10-CM | POA: Diagnosis present

## 2019-04-28 DIAGNOSIS — Z9981 Dependence on supplemental oxygen: Secondary | ICD-10-CM | POA: Diagnosis not present

## 2019-04-28 DIAGNOSIS — E1165 Type 2 diabetes mellitus with hyperglycemia: Secondary | ICD-10-CM | POA: Diagnosis present

## 2019-04-28 DIAGNOSIS — J439 Emphysema, unspecified: Secondary | ICD-10-CM | POA: Diagnosis not present

## 2019-04-28 DIAGNOSIS — H9190 Unspecified hearing loss, unspecified ear: Secondary | ICD-10-CM | POA: Diagnosis not present

## 2019-04-28 DIAGNOSIS — I493 Ventricular premature depolarization: Secondary | ICD-10-CM | POA: Diagnosis present

## 2019-04-28 DIAGNOSIS — I5021 Acute systolic (congestive) heart failure: Secondary | ICD-10-CM | POA: Diagnosis not present

## 2019-04-28 DIAGNOSIS — I428 Other cardiomyopathies: Secondary | ICD-10-CM | POA: Diagnosis present

## 2019-04-28 DIAGNOSIS — D631 Anemia in chronic kidney disease: Secondary | ICD-10-CM | POA: Diagnosis not present

## 2019-04-28 DIAGNOSIS — F419 Anxiety disorder, unspecified: Secondary | ICD-10-CM | POA: Diagnosis not present

## 2019-04-28 DIAGNOSIS — Z20828 Contact with and (suspected) exposure to other viral communicable diseases: Secondary | ICD-10-CM | POA: Diagnosis present

## 2019-04-28 DIAGNOSIS — I4891 Unspecified atrial fibrillation: Secondary | ICD-10-CM | POA: Diagnosis not present

## 2019-04-28 DIAGNOSIS — E1159 Type 2 diabetes mellitus with other circulatory complications: Secondary | ICD-10-CM | POA: Diagnosis not present

## 2019-04-28 DIAGNOSIS — K219 Gastro-esophageal reflux disease without esophagitis: Secondary | ICD-10-CM | POA: Diagnosis present

## 2019-04-28 DIAGNOSIS — F319 Bipolar disorder, unspecified: Secondary | ICD-10-CM | POA: Diagnosis not present

## 2019-04-28 LAB — BASIC METABOLIC PANEL
Anion gap: 16 — ABNORMAL HIGH (ref 5–15)
BUN: 61 mg/dL — ABNORMAL HIGH (ref 8–23)
CO2: 34 mmol/L — ABNORMAL HIGH (ref 22–32)
Calcium: 8.6 mg/dL — ABNORMAL LOW (ref 8.9–10.3)
Chloride: 92 mmol/L — ABNORMAL LOW (ref 98–111)
Creatinine, Ser: 1.15 mg/dL — ABNORMAL HIGH (ref 0.44–1.00)
GFR calc Af Amer: 57 mL/min — ABNORMAL LOW (ref >60)
GFR calc non Af Amer: 49 mL/min — ABNORMAL LOW (ref >60)
Glucose, Bld: 223 mg/dL — ABNORMAL HIGH (ref 70–99)
Potassium: 3.4 mmol/L — ABNORMAL LOW (ref 3.5–5.1)
Sodium: 142 mmol/L (ref 135–145)

## 2019-04-28 LAB — CBC
HCT: 37.3 % (ref 36.0–46.0)
Hemoglobin: 11 g/dL — ABNORMAL LOW (ref 12.0–15.0)
MCH: 28.4 pg (ref 26.0–34.0)
MCHC: 29.5 g/dL — ABNORMAL LOW (ref 30.0–36.0)
MCV: 96.1 fL (ref 80.0–100.0)
Platelets: 203 10*3/uL (ref 150–400)
RBC: 3.88 MIL/uL (ref 3.87–5.11)
RDW: 13.3 % (ref 11.5–15.5)
WBC: 10.8 10*3/uL — ABNORMAL HIGH (ref 4.0–10.5)
nRBC: 0 % (ref 0.0–0.2)

## 2019-04-28 LAB — HEMOGLOBIN A1C
Hgb A1c MFr Bld: 9.9 % — ABNORMAL HIGH (ref 4.8–5.6)
Mean Plasma Glucose: 237.43 mg/dL

## 2019-04-28 LAB — GLUCOSE, CAPILLARY
Glucose-Capillary: 206 mg/dL — ABNORMAL HIGH (ref 70–99)
Glucose-Capillary: 166 mg/dL — ABNORMAL HIGH (ref 70–99)
Glucose-Capillary: 147 mg/dL — ABNORMAL HIGH (ref 70–99)
Glucose-Capillary: 171 mg/dL — ABNORMAL HIGH (ref 70–99)

## 2019-04-28 LAB — CBG MONITORING, ED: Glucose-Capillary: 182 mg/dL — ABNORMAL HIGH (ref 70–99)

## 2019-04-28 LAB — PROTIME-INR
INR: 2.8 — ABNORMAL HIGH (ref 0.8–1.2)
Prothrombin Time: 29.5 seconds — ABNORMAL HIGH (ref 11.4–15.2)

## 2019-04-28 LAB — SARS CORONAVIRUS 2 (TAT 6-24 HRS): SARS Coronavirus 2: NEGATIVE

## 2019-04-28 MED ORDER — SODIUM CHLORIDE 0.9% FLUSH
3.0000 mL | Freq: Two times a day (BID) | INTRAVENOUS | Status: DC
  Administered 2019-04-28 – 2019-05-02 (×9): 3 mL via INTRAVENOUS

## 2019-04-28 MED ORDER — MONTELUKAST SODIUM 10 MG PO TABS
10.0000 mg | ORAL_TABLET | Freq: Every day | ORAL | Status: DC
  Administered 2019-04-28 – 2019-05-01 (×4): 10 mg via ORAL
  Filled 2019-04-28 (×4): qty 1

## 2019-04-28 MED ORDER — ALBUTEROL SULFATE (2.5 MG/3ML) 0.083% IN NEBU: 2.5000 mg | INHALATION_SOLUTION | Freq: Four times a day (QID) | RESPIRATORY_TRACT | Status: DC | PRN

## 2019-04-28 MED ORDER — ESCITALOPRAM OXALATE 10 MG PO TABS
10.0000 mg | ORAL_TABLET | Freq: Every day | ORAL | Status: DC
  Administered 2019-04-28 – 2019-05-01 (×4): 10 mg via ORAL
  Filled 2019-04-28 (×4): qty 1

## 2019-04-28 MED ORDER — SODIUM CHLORIDE 0.9% FLUSH: 3.0000 mL | INTRAVENOUS | Status: DC | PRN

## 2019-04-28 MED ORDER — INSULIN GLARGINE 100 UNIT/ML ~~LOC~~ SOLN
45.0000 [IU] | Freq: Every day | SUBCUTANEOUS | Status: DC
  Administered 2019-04-28 – 2019-05-01 (×4): 45 [IU] via SUBCUTANEOUS
  Filled 2019-04-28 (×5): qty 0.45

## 2019-04-28 MED ORDER — INSULIN GLARGINE 100 UNIT/ML ~~LOC~~ SOLN
35.0000 [IU] | Freq: Every day | SUBCUTANEOUS | Status: DC
  Filled 2019-04-28 (×2): qty 0.35

## 2019-04-28 MED ORDER — NYSTATIN 100000 UNIT/GM EX POWD
Freq: Two times a day (BID) | CUTANEOUS | Status: DC | PRN
  Administered 2019-04-29: 12:00:00 via TOPICAL
  Filled 2019-04-28: qty 15

## 2019-04-28 MED ORDER — PANTOPRAZOLE SODIUM 40 MG PO TBEC
40.0000 mg | DELAYED_RELEASE_TABLET | Freq: Every day | ORAL | Status: DC
  Administered 2019-04-28 – 2019-05-02 (×5): 40 mg via ORAL
  Filled 2019-04-28 (×5): qty 1

## 2019-04-28 MED ORDER — PRIMIDONE 50 MG PO TABS
50.0000 mg | ORAL_TABLET | Freq: Every day | ORAL | Status: DC
  Administered 2019-04-28 – 2019-05-01 (×4): 50 mg via ORAL
  Filled 2019-04-28 (×4): qty 1

## 2019-04-28 MED ORDER — BUDESONIDE 0.5 MG/2ML IN SUSP
0.5000 mg | Freq: Two times a day (BID) | RESPIRATORY_TRACT | Status: DC
  Administered 2019-04-28 – 2019-05-02 (×7): 0.5 mg via RESPIRATORY_TRACT
  Filled 2019-04-28 (×7): qty 2

## 2019-04-28 MED ORDER — METOPROLOL SUCCINATE ER 25 MG PO TB24
25.0000 mg | ORAL_TABLET | Freq: Two times a day (BID) | ORAL | Status: DC
  Administered 2019-04-28 – 2019-05-02 (×9): 25 mg via ORAL
  Filled 2019-04-28 (×9): qty 1

## 2019-04-28 MED ORDER — PREDNISONE 10 MG PO TABS
5.0000 mg | ORAL_TABLET | Freq: Every day | ORAL | Status: DC
  Administered 2019-04-29 – 2019-04-30 (×2): 5 mg via ORAL
  Filled 2019-04-28 (×2): qty 1

## 2019-04-28 MED ORDER — SODIUM CHLORIDE 0.9 % IV SOLN: 250.0000 mL | INTRAVENOUS | Status: DC | PRN

## 2019-04-28 MED ORDER — FLUTICASONE PROPIONATE 50 MCG/ACT NA SUSP
1.0000 | Freq: Two times a day (BID) | NASAL | Status: DC
  Administered 2019-04-28 – 2019-05-02 (×9): 1 via NASAL
  Filled 2019-04-28: qty 16

## 2019-04-28 MED ORDER — ALLOPURINOL 100 MG PO TABS
100.0000 mg | ORAL_TABLET | Freq: Every day | ORAL | Status: DC
  Administered 2019-04-28 – 2019-05-02 (×5): 100 mg via ORAL
  Filled 2019-04-28 (×5): qty 1

## 2019-04-28 MED ORDER — WARFARIN SODIUM 2 MG PO TABS
4.0000 mg | ORAL_TABLET | Freq: Once | ORAL | Status: AC
  Administered 2019-04-28: 4 mg via ORAL
  Filled 2019-04-28: qty 2

## 2019-04-28 MED ORDER — METOLAZONE 5 MG PO TABS
2.5000 mg | ORAL_TABLET | Freq: Every day | ORAL | Status: DC
  Administered 2019-04-28 – 2019-05-02 (×5): 2.5 mg via ORAL
  Filled 2019-04-28 (×5): qty 1

## 2019-04-28 MED ORDER — LOSARTAN POTASSIUM 25 MG PO TABS
12.5000 mg | ORAL_TABLET | Freq: Every day | ORAL | Status: DC
  Administered 2019-04-28 – 2019-05-02 (×5): 12.5 mg via ORAL
  Filled 2019-04-28 (×5): qty 1

## 2019-04-28 MED ORDER — WARFARIN - PHARMACIST DOSING INPATIENT
Freq: Every day | Status: DC
  Administered 2019-04-28: 18:00:00

## 2019-04-28 MED ORDER — FUROSEMIDE 10 MG/ML IJ SOLN
60.0000 mg | Freq: Two times a day (BID) | INTRAMUSCULAR | Status: DC
  Administered 2019-04-28 – 2019-05-02 (×9): 60 mg via INTRAVENOUS
  Filled 2019-04-28 (×9): qty 6

## 2019-04-28 NOTE — Progress Notes (Addendum)
PROGRESS NOTE    Denise Freeman  ZMO:[id number] DOB: 22-May-1951 DOA: 04/27/2019 PCP: Celene Squibb, MD     Brief Narrative:  As per H&P written by Dr. Myna Hidalgo on 04/27/2019 68 y.o. female with medical history significant for chronic combined systolic and diastolic CHF, atrial fibrillation on warfarin, COPD with chronic hypoxic respiratory failure, depression with anxiety, insulin-dependent diabetes mellitus, and chronic kidney disease stage III, now presenting to the emergency department for evaluation of progressive shortness of breath, leg swelling, and weight gain.  Patient reports that her diuretics were adjusted when she was discharged from the hospital 3 months ago, she does not feel like her respiratory status ever returned to her previous baseline since that admission, reports multiple adjustments to her medications in the interim, but reports progressive dyspnea, weight gain, and swelling, particularly over the past few days.  She denies any chest pain, fevers, chills, wheezing, or any significant coughing.  ED Course: Upon arrival to the ED, patient is found to be afebrile, saturating mid 90s on her usual 3 L/min of supplemental oxygen, and with stable blood pressure.  EKG features atrial fibrillation with PVCs and QTc interval of 523 ms.  Chest x-ray is notable for mild cardiomegaly without overt edema.  Chemistry panel features a BUN of 62 and creatinine 1.22, similar to priors.  CBC is notable for mild leukocytosis.  INR is therapeutic.  BNP is elevated to 318.  High-sensitivity troponin is normal x2.  Patient was given 80 mg IV Lasix in the ED.   Assessment & Plan: 1-acute on chronic combined systolic and diastolic CHF (congestive heart failure) ([place]) -Patient reports some diet indiscretion and lack of response to outpatient Demadex. -Continue to complain of shortness of breath and orthopnea -No chest pain, no palpitations, no nausea or vomiting. -Positive signs of fluid overload on  exam (positive crackles and increased lower extremity swelling). -Continue IV diuresis -Will add metolazone -Continue daily weights, low-sodium diet and strict I's and O's -Cardiology service has been consulted.  2-Unspecified atrial fibrillation (HCC) -Rate control and is stable -Continue Coumadin for secondary prevention -Continue beta-blocker.  3-acute on chronic hypoxic, hypercapnic respiratory failure -continue CPAP QHS -continue oxygen supplementation -treating CHF exacerbation and will wean O2 sat down to baseline supplementation as tolerated.  4-Other emphysema ([place]) -Currently no wheezing -Continue home nebulizer regimen and Flonase  5-Mood disorder in conditions classified elsewhere -No hallucinations or suicidal ideation currently -Continue home antidepressant therapy  6-Uncontrolled type 2 diabetes mellitus with hyperglycemia (HCC) -Continue sliding scale insulin and Lantus -Modified carbohydrate diet has been discussed with patient.  7-Prolonged QT interval -Chronically follow on telemetry and minimize the use of medications that can prolong QT. -Follow closely electrolytes and replete as needed.  8-chronic kidney disease a stage IIIb -Appears to be secondary to diabetes -Is stable and at baseline -Closely follow renal function trend with acute diuresis process.  DVT prophylaxis: Coumadin Code Status: Full code Family Communication: No family at bedside. Disposition Plan: Remains inpatient for IV diuresis; follow cardiology service recommendations, continue to follow low-sodium diet, daily weights and strict I's and O's.  Consultants:   Cardiology service.  Procedures:   See below for x-ray reports.  Antimicrobials:  Anti-infectives (From admission, onward)   None      Subjective: Continue complaining of shortness of breath and orthopnea.  Signs of fluid overload on exam.  Requiring 3 L nasal cannula supplementation to maintain O2 sats  (chronically only uses 2 at home).  Patient denies chest  pain, no nausea, no vomiting.  Objective: Vitals:   04/28/19 0126 04/28/19 0127 04/28/19 0509 04/28/19 0700  BP: (!) 150/137 (!) 120/93 122/74   Pulse: (!) 133 84 74   Resp: (!) 22  20   Temp: 97.9 F (36.6 C)  98.2 F (36.8 C)   TempSrc: Oral  Oral   SpO2: 97%  96%   Weight:    94.5 kg  Height:    4\' 11"  (1.499 m)    Intake/Output Summary (Last 24 hours) at 04/28/2019 1202 Last data filed at 04/28/2019 0900 Gross per 24 hour  Intake 279.6 ml  Output 700 ml  Net -420.4 ml   Filed Weights   04/27/19 1617 04/28/19 0700  Weight: 91.4 kg 94.5 kg    Examination: General exam: Alert, awake, oriented x 3; still complaining of shortness of breath and orthopnea.  Requiring 3 L nasal cannula supplementation to maintain O2 sats Respiratory system: Positive scattered rhonchi right, fine crackles at the bases, no using accessory muscles. Cardiovascular system:Rate controlled.  No murmurs, rubs or gallops. Gastrointestinal system: Abdomen is obese, nondistended, soft and nontender. No organomegaly or masses felt. Normal bowel sounds heard. Central nervous system: Alert and oriented. No focal neurological deficits. Extremities: No cyanosis or clubbing; 1-2+ edema bilaterally appreciated on exam. Skin: No petechiae, no open wounds. Psychiatry: Judgement and insight appear normal. Mood & affect appropriate.     Data Reviewed: I have personally reviewed following labs and imaging studies  CBC: Recent Labs  Lab 04/27/19 1716 04/28/19 0437  WBC 10.9* 10.8*  HGB 11.3* 11.0*  HCT 37.9 37.3  MCV 96.7 96.1  PLT 213 300   Basic Metabolic Panel: Recent Labs  Lab 04/27/19 1716 04/28/19 0437  NA 135 142  K 3.8 3.4*  CL 91* 92*  CO2 32 34*  GLUCOSE 255* 223*  BUN 62* 61*  CREATININE 1.22* 1.15*  CALCIUM 8.2* 8.6*   GFR: Estimated Creatinine Clearance: 47.1 mL/min (A) (by C-G formula based on SCr of 1.15 mg/dL (H)).   Coagulation Profile: Recent Labs  Lab 04/27/19 1716 04/28/19 0437  INR 2.7* 2.8*   CBG: Recent Labs  Lab 04/28/19 0000 04/28/19 0800 04/28/19 1157  GLUCAP 182* 206* 166*   Urine analysis:    Component Value Date/Time   COLORURINE YELLOW 09/07/2016 1413   APPEARANCEUR HAZY (A) 09/07/2016 1413   LABSPEC 1.008 09/07/2016 1413   PHURINE 7.0 09/07/2016 1413   GLUCOSEU NEGATIVE 09/07/2016 1413   HGBUR NEGATIVE 09/07/2016 1413   BILIRUBINUR NEGATIVE 09/07/2016 1413   KETONESUR NEGATIVE 09/07/2016 1413   PROTEINUR 30 (A) 09/07/2016 1413   UROBILINOGEN 1.0 02/20/2015 1855   NITRITE NEGATIVE 09/07/2016 1413   LEUKOCYTESUR MODERATE (A) 09/07/2016 1413    Radiology Studies: Dg Chest 2 View  Result Date: 04/27/2019 CLINICAL DATA:  Shortness of breath and weight gain EXAM: CHEST - 2 VIEW COMPARISON:  01/22/2019 FINDINGS: Mild cardiomegaly. No overt pulmonary edema. No pneumothorax or pleural effusion. No focal airspace consolidation. IMPRESSION: Mild cardiomegaly without overt pulmonary edema. Electronically Signed   By: Ulyses Jarred M.D.   On: 04/27/2019 19:39    Scheduled Meds: . furosemide  60 mg Intravenous Q12H  . insulin aspart  0-15 Units Subcutaneous TID WC  . insulin aspart  0-5 Units Subcutaneous QHS  . insulin glargine  35 Units Subcutaneous QHS  . metolazone  2.5 mg Oral Daily  . potassium chloride  20 mEq Oral BID  . sodium chloride flush  3 mL Intravenous Q12H  .  warfarin  4 mg Oral Once  . Warfarin - Pharmacist Dosing Inpatient   Does not apply q1800   Continuous Infusions: . sodium chloride       LOS: 0 days    Time spent: 30 minutes.   Barton Dubois, MD [facility] Pager [phone]  04/28/2019, 12:02 PM

## 2019-04-28 NOTE — Progress Notes (Signed)
ANTICOAGULATION CONSULT NOTE -   Pharmacy Consult for warfarin dosing Indication: atrial fibrillation  Drug interactions: n/a Concurrent anti-platelet drugs: n/a  Allergies  Allergen Reactions  . Xarelto [Rivaroxaban] Other (See Comments)    Internal bleeding  . Ancef [Cefazolin] Nausea And Vomiting  . Levaquin [Levofloxacin In D5w] Other (See Comments)    "afib"  . Lyrica [Pregabalin] Hives  . Tamiflu [Oseltamivir Phosphate] Other (See Comments)    "water blisters"  . Zoloft [Sertraline Hcl] Other (See Comments)    Jaw problems, jittery  . Augmentin [Amoxicillin-Pot Clavulanate] Itching  . Ciprofloxacin Itching and Nausea And Vomiting  . Haldol [Haloperidol] Other (See Comments)    Restless leg  . Nsaids Diarrhea  . Penicillins Itching, Nausea And Vomiting and Rash    Has patient had a PCN reaction causing immediate rash, facial/tongue/throat swelling, SOB or lightheadedness with hypotension: Yes Has patient had a PCN reaction causing severe rash involving mucus membranes or skin necrosis: No Has patient had a PCN reaction that required hospitalization No Has patient had a PCN reaction occurring within the last 10 years: Yes If all of the above answers are "NO", then may proceed with Cephalosporin use.  . Topamax [Topiramate] Nausea Only    Patient Measurements: Height: 4\' 11"  (149.9 cm) Weight: 208 lb 5.4 oz (94.5 kg) IBW/kg (Calculated) : 43.2  HEPARIN DW (KG): 66.2  Vital Signs: Temp: 98.2 F (36.8 C) (11/25 0509) Temp Source: Oral (11/25 0509) BP: 122/74 (11/25 0509) Pulse Rate: 74 (11/25 0509)  Labs: Recent Labs    04/27/19 1716 04/27/19 2120 04/28/19 0437  HGB 11.3*  --  11.0*  HCT 37.9  --  37.3  PLT 213  --  203  LABPROT 28.7*  --  29.5*  INR 2.7*  --  2.8*  CREATININE 1.22*  --  1.15*  TROPONINIHS 11 12  --     Estimated Creatinine Clearance: 47.1 mL/min (A) (by C-G formula based on SCr of 1.15 mg/dL (H)).   Assessment: Pharmacy consulted to  dose warfarin for this 68 yo female  on chronic warfarin therapy with atrial fibrillation.  She had internal bleeding in the past with Xarelto.  CBC is within normal limits and stable. 11.3>> 11g/dL. INR remains therapeutic at 2.8.  Home dose:   4mg  daily except for 2mg  on Thursdays/Sundays   Goal of Therapy:  INR 2-3 Monitor platelets by anticoagulation protocol: Yes   Plan:  Coumadin 4mg  po x 1 today Daily PT/INR and every other day CBC Monitor for signs and symptoms of bleeding   Isac Sarna, BS [place], BCPS Clinical Pharmacist Pager [phone] 04/28/2019 10:59 AM

## 2019-04-28 NOTE — Progress Notes (Signed)
Patient has home CPAP unit at bedside. RT filled water chamber with sterile water and placed unit within reach of patient. Patient states she will place herself on when ready. RT informed patient if she has any trouble have RN contact RT.

## 2019-04-28 NOTE — TOC Initial Note (Signed)
Transition of Care [facility]) - Initial/Assessment Note    Patient Details  Name: Denise Freeman MRN: [id number] Date of Birth: 07/26/1950  Transition of Care [facility]) CM/SW Contact:    Louie Flenner, Chauncey Reading, RN Phone Number: 04/28/2019, 10:03 AM  Clinical Narrative:          Observation status, here with CHF. From home with husband. Wears oxygen continuously, on 3 liters, provider- [facility]. Walks with RW. Previously active with [facility] for RN disease management. Reports compliance with medications and diet.   Discussed during progression rounds. ? Changes in some medications at time of DC.   TOC to follow for needs. Anticipate DC home with self care.   Expected Discharge Plan: Home/Self Care Barriers to Discharge: Continued Medical Work up   Patient Goals and CMS Choice Patient states their goals for this hospitalization and ongoing recovery are:: return home CMS Medicare.gov Compare Post Acute Care list provided to:: Patient    Expected Discharge Plan and Services Expected Discharge Plan: Home/Self Care   Discharge Planning Services: CM Consult   Living arrangements for the past 2 months: Single Family Home                                      Prior Living Arrangements/Services Living arrangements for the past 2 months: Single Family Home Lives with:: Spouse   Do you feel safe going back to the place where you live?: Yes      Need for Family Participation in Patient Care: No (Comment)   Current home services: DME(o2, rw) Criminal Activity/Legal Involvement Pertinent to Current Situation/Hospitalization: No - Comment as needed  Activities of Daily Living Home Assistive Devices/Equipment: CBG Meter, Walker (specify type) ADL Screening (condition at time of admission) [name] cognitive ability adequate to safely complete daily activities?: Yes Is the patient deaf or have difficulty hearing?: No Does the patient have difficulty seeing, even when  wearing glasses/contacts?: No Does the patient have difficulty concentrating, remembering, or making decisions?: No Patient able to express need for assistance with ADLs?: Yes Does the patient have difficulty dressing or bathing?: No Independently performs ADLs?: Yes (appropriate for developmental age) Does the patient have difficulty walking or climbing stairs?: Yes Weakness of Legs: Both Weakness of Arms/Hands: None      Orientation: : Oriented to Self, Oriented to Place, Oriented to  Time      Admission diagnosis:  Acute on chronic congestive heart failure, unspecified heart failure type [facility]) [I50.9] Patient Active Problem List   Diagnosis Date Noted  . Prolonged QT interval 04/27/2019  . CHF exacerbation ([place]) 01/24/2019  . Acute exacerbation of CHF (congestive heart failure) ([place]) 01/23/2019  . Gout 11/16/2017  . Chronic kidney disease 11/16/2017  . Uncontrolled type 2 diabetes mellitus with hyperglycemia ([place]) 11/12/2017  . Pain of left lower extremity 10/24/2017  . Medically complex patient 10/03/2017  . Chronic pain syndrome 10/03/2017  . Unstable gait 10/03/2017  . Diarrhea 07/29/2017  . Type 2 diabetes mellitus with stage 3 chronic kidney disease, with long-term current use of insulin ([place]) 04/01/2017  . Essential hypertension, benign 04/01/2017  . Mood disorder in conditions classified elsewhere 03/31/2017  . Chronic diastolic congestive heart failure ([place]) 03/12/2017  . Chronic systolic CHF (congestive heart failure) ([place]) 11/05/2016  . Pressure injury of skin 09/09/2016  . NICM (nonischemic cardiomyopathy) ([place])   . Class 2 severe obesity due to excess calories with  serious comorbidity and body mass index (BMI) of 38.0 to 38.9 in adult [facility])   . Acute systolic congestive heart failure ([place])   . S/P laparoscopic cholecystectomy   . Heart failure ([place]) 09/06/2016  . Dyslipidemia 08/07/2016  . Vision loss 04/19/2016  . Hx of colonic polyps 03/21/2016  . Blood in  stool   . Benign neoplasm of cecum   . Benign neoplasm of rectum   . CKD (chronic kidney disease), stage III ([place]) 03/14/2016  . Mixed hyperlipidemia 03/14/2016  . Scalp lesion 03/14/2016  . Peripheral polyneuropathy 01/26/2016  . Vitamin D deficiency 09/26/2015  . DM type 2 causing vascular disease ([place]) 08/28/2015  . Chronic upper GI bleeding 06/24/2015  . COPD with asthma ([place]) 02/22/2015  . OSA (obstructive sleep apnea) 08/22/2014  . Other emphysema ([place]) 05/29/2014  . Chronic hypoxic, hypercapnic respiratory failure 04/21/2014  . Microcytic anemia 04/21/2014  . Fibromyalgia 04/14/2014  . Uncontrolled secondary diabetes mellitus with stage 3 CKD (GFR 30-59) (HCC) 04/14/2014  . Dyspnea on exertion 04/14/2014  . Acute on chronic combined systolic and diastolic CHF (congestive heart failure) ([place]) 04/14/2014  . Unspecified atrial fibrillation ([place]) 04/12/2014   PCP:  Celene Squibb, MD Pharmacy:   [facility], [place] [address] [place] [address] Phone: [phone] Fax: [phone]  [facility] [place], [place] [address] [place] [place] [address] Phone: [phone] Fax: [phone]  [facility] [place], [place] [address] Phone: [phone] Fax: [phone]  [facility] [place] to [address] [place] [address] Phone: [phone] Fax: [phone]     Social Determinants of Health (SDOH) Interventions    Readmission Risk Interventions Readmission Risk Prevention Plan 01/25/2019  Transportation Screening Complete  PCP or Specialist Appt within 3-5 Days Complete  HRI or Home Care Consult Complete  Social Work Consult for [place] Planning/Counseling Complete  Palliative Care Screening Not Applicable  Medication Review [employer])  Complete  Some recent data might be hidden

## 2019-04-28 NOTE — Plan of Care (Signed)

## 2019-04-28 NOTE — Consult Note (Signed)
Cardiology Consultation:   Patient ID: Denise Freeman MRN: [id number]; DOB: 1950/09/18  Admit date: 04/27/2019 Date of Consult: 04/28/2019  Primary Care Provider: Celene Squibb, MD Primary Cardiologist: Pixie Casino, MD  Primary Electrophysiologist:  None    Patient Profile:   Denise Freeman is a 68 y.o. female with a hx of chronic combined heart failure and atrial fibrillation who is being seen today for the evaluation of acute on chronic combined CHF at the request of Dr Dyann Kief.  History of Present Illness:   Denise Freeman is a 68 year old morbidly obese woman with a past medical history significant for chronic combined heart failure, oxygen dependent COPD, gout, obstructive sleep apnea on CPAP, hypertension, dyslipidemia, blindness, and atrial fibrillation.    She has a nonischemic cardiomyopathy.  She was last seen in the cardiology office in September 2020.  At that time she was taking torsemide 80 mg twice daily and metolazone 5 mg daily.  She had been hospitalized in August at [facility] for decompensated heart failure.  Her weight was 91.2 kg on 02/03/2019, 91.4 kg on 02/15/2019, 91.4 kg yesterday, and 94.5 kg today.  She tells me she has had visiting health nurses coming to her house twice per week and checking on her fluid status and wearing her.  She said her leg swelling and shortness of breath never resolved.  She then saw her PCP who switched her to Lasix 40 mg twice daily and metolazone 2.5 mg daily.  She was then increased to Lasix 80 mg twice daily and metolazone 5 mg daily by her PCP.  In spite of this, she said she continued to have shortness of breath and leg swelling and was tired out when washing one dish after which she had to sit down and rest.  She then presented to the ED yesterday.  She was given 80 mg of IV Lasix in the ED and started on 60 mg IV twice daily by internal medicine.  She says she has had some urinary output which has not been recorded yet.   BNP mildly elevated to 318.  Today BUN 61, creatinine 1.15.  Hemoglobin 11.  Troponins were normal.  HbA1c elevated at 9.9%.  Chest x-ray showed cardiomegaly without overt pulmonary edema.  ECG on admission which I personally view demonstrates rate controlled atrial fibrillation with PVCs and late R wave transition.  She has been on prednisone 5 mg daily for the past year, she says for gout.  She has been using 3 L of oxygen chronically which was recently increased to 4 L by home health nurse.  She eats at home and rarely goes out to eat but does eat canned foods.  She says she tries to avoid salt.   Heart Pathway Score:     Past Medical History:  Diagnosis Date  . Allergy   . Anemia   . Anxiety   . Asthma   . Atrial fibrillation ([place])   . Bipolar 1 disorder ([place])   . 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 Cholelithiasis    a. 09/2016 s/p Lap Chole.  . Chronic bronchitis ([place])   . Chronic combined systolic and diastolic congestive heart failure ([place])    a. 09/2016 Echo: EF 30-35%.  . Colon polyps   . COPD (chronic obstructive pulmonary disease) ([place])   . Depression   . Family history of adverse reaction to anesthesia    Uncle was positive for malignant hyperthermia; patient had  testing done and was negative.  . Fibromyalgia   . GERD (gastroesophageal reflux disease)   . Gout   . High cholesterol   . History of blood transfusion 06/2015   "bleeding from my rectum"  . History of hiatal hernia   . HOH (hard of hearing)   . Hx of colonic polyps 03/21/2016   3 small adenomas no recall - co-morbidities  . Neuropathy    Disc Back   . NICM (nonischemic cardiomyopathy) ([place])    a. Previously worked up in [place], MD-->low EF with subsequent recovery.  Multiple caths (last ~ 2014 per pt report)--reportedly nl cors;  b. 09/2016 Echo: EF 30-35%, antsept/apical HK, mild MR, mildly dil LA, mod dil RA;  c. 09/2016 Lexi MV: EF 26%, glob HK, sept DK, med size, mod intensity  fixed septal defect - BBB/PVC related artifact, no ischemia.  . On home oxygen therapy    "3L; 24/7" (03/14/2016)  . OSA treated with BiPAP    uses biPAP, 10 (03/14/2016)  . Osteoarthritis   . Oxygen deficiency   . Pneumonia   . Type II diabetes mellitus ([place])     Past Surgical History:  Procedure Laterality Date  . APPENDECTOMY     "they busted"  . bladder stimulator     pt states, "it cannot be turned off; it's in my right hip; dead battery so it's not working anymore". (03/14/2016)  . BLADDER SUSPENSION     2003, 2006 and 2010  . CATARACT EXTRACTION W/PHACO Right 11/29/2014   Procedure: CATARACT EXTRACTION PHACO AND INTRAOCULAR LENS PLACEMENT (IOC);  Surgeon: Rutherford Guys, MD;  Location: [facility];  Service: Ophthalmology;  Laterality: Right;  CDE:3.81  . CATARACT EXTRACTION W/PHACO Left 12/13/2014   Procedure: CATARACT EXTRACTION PHACO AND INTRAOCULAR LENS PLACEMENT (IOC);  Surgeon: Rutherford Guys, MD;  Location: [facility];  Service: Ophthalmology;  Laterality: Left;  CDE:6.59  . CERVICAL DISC SURGERY N/A 2009   4, 6, and 7 cervical disc replaced  . CHOLECYSTECTOMY N/A 09/13/2016   Procedure: LAPAROSCOPIC CHOLECYSTECTOMY;  Surgeon: Rolm Bookbinder, MD;  Location: [facility];  Service: General;  Laterality: N/A;  . COLONOSCOPY WITH PROPOFOL N/A 03/15/2016   Procedure: COLONOSCOPY WITH PROPOFOL;  Surgeon: Gatha Mayer, MD;  Location: [facility];  Service: Endoscopy;  Laterality: N/A;  . ESOPHAGOGASTRODUODENOSCOPY (EGD) WITH PROPOFOL N/A 03/15/2016   Procedure: ESOPHAGOGASTRODUODENOSCOPY (EGD) WITH PROPOFOL;  Surgeon: Gatha Mayer, MD;  Location: [facility];  Service: Endoscopy;  Laterality: N/A;  . HEEL SPUR SURGERY Bilateral   . HERNIA REPAIR    . I&D EXTREMITY Right 06/13/2015   Procedure: MINOR IRRIGATION AND DEBRIDEMENT EXTREMITY REMOVAL OF NAIL;  Surgeon: Daryll Brod, MD;  Location: [facility];  Service: Orthopedics;  Laterality: Right;  . TUBAL LIGATION    .  UMBILICAL HERNIA REPAIR     w/mesh     Home Medications:  Prior to Admission medications   Medication Sig Start Date End Date Taking? Authorizing Provider  albuterol (PROVENTIL) (2.5 MG/3ML) 0.083% nebulizer solution USE (1) IN NEBULIZER EVERY 6 HOURS AS NEEDED FOR SHORTNESS OF BREATH. 10/12/18  Yes Chesley Mires, MD  allopurinol (ZYLOPRIM) 100 MG tablet TAKE 1 TABLET BY MOUTH ONCE DAILY. 12/08/17  Yes Riccio, Angela C, DO  budesonide (PULMICORT) 0.5 MG/2ML nebulizer solution Take 2 mLs (0.5 mg total) by nebulization 2 (two) times daily. 04/14/19  Yes Chesley Mires, MD  cetirizine (ZYRTEC) 10 MG tablet Take 1 tablet (10 mg total) by mouth at bedtime. 01/30/17  Yes Rogue Bussing, MD  cholecalciferol (VITAMIN D) 400 units TABS tablet Take 1 tablet (400 Units total) by mouth 2 (two) times daily. 07/17/17  Yes Rogue Bussing, MD  clobetasol cream (TEMOVATE) 0.05 %  01/28/19  Yes [provider]  colchicine 0.6 MG tablet Take 1 tablet (0.6 mg total) by mouth as needed. 01/25/19  Yes Kathie Dike, MD  escitalopram (LEXAPRO) 10 MG tablet Take 1 tablet (10 mg total) by mouth at bedtime. 10/24/17  Yes Hisada, Elie Goody, MD  FEROSUL 325 (65 Fe) MG tablet Take 1 tablet by mouth daily. 01/08/19  Yes [provider]  fluticasone (FLONASE) 50 MCG/ACT nasal spray Place 1 spray into both nostrils 2 (two) times daily.   Yes [provider]  furosemide (LASIX) 40 MG tablet Take 80 mg by mouth 2 (two) times daily.  04/27/19  Yes Hilty, Nadean Corwin, MD  Insulin Glargine (BASAGLAR KWIKPEN) 100 UNIT/ML SOPN Inject 0.6 mLs (60 Units total) into the skin at bedtime. 11/12/17  Yes Nida, Marella Chimes, MD  insulin lispro (HUMALOG KWIKPEN) 100 UNIT/ML KiwkPen Inject 0.1-0.16 mLs (10-16 Units total) into the skin 3 (three) times daily before meals. 11/12/17  Yes Cassandria Anger, MD  Insulin Pen Needle 32G X 4 MM MISC Use to inject insulin 5 times daily 03/27/18  Yes Philemon [place],  MD  ipratropium (ATROVENT) 0.02 % nebulizer solution USE (1) IN NEBULIZER EVERY 6 HOURS AS NEEDED FOR SHORTNESS OF BREATH. 10/12/18  Yes Chesley Mires, MD  losartan (COZAAR) 25 MG tablet TAKE 1 TABLET BY MOUTH ONCE DAILY. 03/11/19  Yes Hilty, Nadean Corwin, MD  metolazone (ZAROXOLYN) 5 MG tablet TAKE 1 TABLET BY MOUTH ONCE DAILY AS NEEDED FOR EDEMA. * TAKE 30 MINUTES BEFORE torsemide* 02/24/19  Yes Hilty, Nadean Corwin, MD  metoprolol succinate (TOPROL-XL) 25 MG 24 hr tablet TAKE 1 TABLET BY MOUTH TWICE DAILY 03/11/19  Yes Hilty, Nadean Corwin, MD  mirtazapine (REMERON) 15 MG tablet Take 1 tablet (15 mg total) by mouth at bedtime. 10/24/17  Yes Norman Clay, MD  Misc Natural Products (TART CHERRY ADVANCED PO) Take by mouth daily.   Yes [provider]  modafinil (PROVIGIL) 200 MG tablet Take 1 tablet (200 mg total) by mouth daily. 03/24/18  Yes Sood, Elisabeth Cara, MD  montelukast (SINGULAIR) 10 MG tablet TAKE ONE TABLET BY MOUTH AT BEDTIME. 03/11/19  Yes Chesley Mires, MD  mupirocin cream (BACTROBAN) 2 % Apply 1 application topically 2 (two) times daily. 11/27/17  Yes Rogue Bussing, MD  nitroGLYCERIN (NITROSTAT) 0.4 MG SL tablet Place 0.4 mg under the tongue every 5 (five) minutes as needed for chest pain.   Yes [provider]  nystatin (NYSTATIN) powder Apply topically 2 (two) times daily. Apply under breasts 10/09/16  Yes Rogue Bussing, MD  ondansetron (ZOFRAN) 4 MG tablet Take 4 mg by mouth once.   Yes [provider]  oxyCODONE-acetaminophen (PERCOCET/ROXICET) 5-325 MG tablet Take 1 tablet by mouth 2 (two) times daily as needed for severe pain. 11/27/17  Yes Rogue Bussing, MD  pantoprazole (PROTONIX) 20 MG tablet TAKE (1) TABLET BY MOUTH TWICE A DAY BEFORE MEALS. (BREAKFAST AND SUPPER) 12/02/17  Yes Riccio, Angela C, DO  potassium chloride (K-DUR) 10 MEQ tablet Take 1 tablet (10 mEq total) by mouth daily. 02/24/19  Yes Hilty, Nadean Corwin, MD  predniSONE (DELTASONE) 5 MG  tablet Take 1 tablet (5 mg total) by mouth daily with breakfast. 11/07/17  Yes Chesley Mires, MD  primidone (  MYSOLINE) 50 MG tablet TAKE ONE TABLET BY MOUTH AT BEDTIME. 11/10/17  Yes Rogue Bussing, MD  Probiotic Product (PROBIOTIC PO) Take 1 tablet by mouth at bedtime.   Yes [provider]  triamcinolone lotion (KENALOG) 0.1 % Apply 1 application topically 3 (three) times daily. For up to 14 days and as needed 01/26/16  Yes Martinique, Betty G, MD  TRUE METRIX BLOOD GLUCOSE TEST test strip USE TO TEST BLOOD SUGAR AS DIRECTED. 09/24/17  Yes Nida, Marella Chimes, MD  Vitamin A 2400 MCG (8000 UT) CAPS Take 8,000 Units by mouth every morning.    Yes [provider]  warfarin (COUMADIN) 4 MG tablet TAKE 1 TABLET BY MOUTH ONCE DAILY EXCEPT ON THURSDAY TAKE 1/2 TABLET 03/11/19  Yes Hilty, Nadean Corwin, MD  mometasone (NASONEX) 50 MCG/ACT nasal spray Place 1 spray into the nose daily. Patient not taking: Reported on 04/28/2019 01/10/17   Chesley Mires, MD    Inpatient Medications: Scheduled Meds: . allopurinol  100 mg Oral Daily  . budesonide  0.5 mg Nebulization BID  . escitalopram  10 mg Oral QHS  . fluticasone  1 spray Each Nare BID  . furosemide  60 mg Intravenous Q12H  . insulin aspart  0-15 Units Subcutaneous TID WC  . insulin aspart  0-5 Units Subcutaneous QHS  . insulin glargine  45 Units Subcutaneous QHS  . metolazone  2.5 mg Oral Daily  . metoprolol succinate  25 mg Oral BID  . montelukast  10 mg Oral QHS  . pantoprazole  40 mg Oral Daily  . potassium chloride  20 mEq Oral BID  . [START ON 04/29/2019] predniSONE  5 mg Oral Q breakfast  . primidone  50 mg Oral QHS  . sodium chloride flush  3 mL Intravenous Q12H  . warfarin  4 mg Oral Once  . Warfarin - Pharmacist Dosing Inpatient   Does not apply q1800   Continuous Infusions: . sodium chloride     PRN Meds: sodium chloride, acetaminophen, albuterol, oxyCODONE-acetaminophen, sodium chloride flush  Allergies:     Allergies  Allergen Reactions  . Xarelto [Rivaroxaban] Other (See Comments)    Internal bleeding  . Ancef [Cefazolin] Nausea And Vomiting  . Levaquin [Levofloxacin In D5w] Other (See Comments)    "afib"  . Lyrica [Pregabalin] Hives  . Tamiflu [Oseltamivir Phosphate] Other (See Comments)    "water blisters"  . Zoloft [Sertraline Hcl] Other (See Comments)    Jaw problems, jittery  . Augmentin [Amoxicillin-Pot Clavulanate] Itching  . Ciprofloxacin Itching and Nausea And Vomiting  . Haldol [Haloperidol] Other (See Comments)    Restless leg  . Nsaids Diarrhea  . Penicillins Itching, Nausea And Vomiting and Rash    Has patient had a PCN reaction causing immediate rash, facial/tongue/throat swelling, SOB or lightheadedness with hypotension: Yes Has patient had a PCN reaction causing severe rash involving mucus membranes or skin necrosis: No Has patient had a PCN reaction that required hospitalization No Has patient had a PCN reaction occurring within the last 10 years: Yes If all of the above answers are "NO", then may proceed with Cephalosporin use.  . Topamax [Topiramate] Nausea Only    Social History:   Social History   Socioeconomic History  . Marital status: Married    Spouse name: Not on file  . Number of children: 1  . Years of education: Not on file  . Highest education level: Not on file  Occupational History  . Occupation: house wife  Social  Needs  . Financial resource strain: Not on file  . Food insecurity    Worry: Not on file    Inability: Not on file  . Transportation needs    Medical: Not on file    Non-medical: Not on file  Tobacco Use  . Smoking status: Former Smoker    Packs/day: 2.00    Years: 14.00    Pack years: 28.00    Types: Cigarettes    Quit date: 04/12/1989    Years since quitting: 30.0  . Smokeless tobacco: Never Used  Substance and Sexual Activity  . Alcohol use: No    Alcohol/week: 0.0 standard drinks  . Drug use: No  . Sexual  activity: Never  Lifestyle  . Physical activity    Days per week: Not on file    Minutes per session: Not on file  . Stress: Not on file  Relationships  . Social Herbalist on phone: Not on file    Gets together: Not on file    Attends religious service: Not on file    Active member of club or organization: Not on file    Attends meetings of clubs or organizations: Not on file    Relationship status: Not on file  . Intimate partner violence    Fear of current or ex partner: Not on file    Emotionally abused: Not on file    Physically abused: Not on file    Forced sexual activity: Not on file  Other Topics Concern  . Not on file  Social History Narrative   Married   Disabled   1 child; 59 yrs   6 pregnancies   1 child    Family History:    Family History  Problem Relation Age of Onset  . Heart disease Mother   . COPD Mother   . Diabetes Mother   . Breast cancer Mother   . Heart disease Father   . Hyperlipidemia Father   . COPD Sister   . Heart disease Sister   . Diabetes Sister   . Heart disease Maternal Grandmother   . Cancer Maternal Grandmother        stomach  . Cancer Maternal Grandfather        lung   . Bipolar disorder Brother      ROS:  Please see the history of present illness.   All other ROS reviewed and negative.     Physical Exam/Data:   Vitals:   04/28/19 0126 04/28/19 0127 04/28/19 0509 04/28/19 0700  BP: (!) 150/137 (!) 120/93 122/74   Pulse: (!) 133 84 74   Resp: (!) 22  20   Temp: 97.9 F (36.6 C)  98.2 F (36.8 C)   TempSrc: Oral  Oral   SpO2: 97%  96%   Weight:    94.5 kg  Height:    4\' 11"  (1.499 m)    Intake/Output Summary (Last 24 hours) at 04/28/2019 1610 Last data filed at 04/28/2019 1200 Gross per 24 hour  Intake 519.6 ml  Output 700 ml  Net -180.4 ml   Last 3 Weights 04/28/2019 04/27/2019 02/15/2019  Weight (lbs) 208 lb 5.4 oz 201 lb 8 oz 201 lb 9.6 oz  Weight (kg) 94.5 kg 91.4 kg 91.445 kg  Some  encounter information is confidential and restricted. Go to Review Flowsheets activity to see all data.     Body mass index is 42.08 kg/m.  General:  Well nourished, well developed, in no  acute distress, morbidly obese HEENT: Bilateral blindness Lymph: no adenopathy Neck: JVP difficult to assess due to body habitus Endocrine:  No thryomegaly Cardiac: Irregular rhythm, normal rate, normal S1, S2, no S3, no murmurs Lungs: Diffusely diminished breath sounds Abd: soft, nontender, obese Ext: 2+ pitting bilateral leg edema Musculoskeletal:  No deformities, BUE and BLE strength normal and equal Skin: warm and dry  Neuro:  no focal abnormalities noted Psych:  Normal affect   EKG:  The EKG was personally reviewed and demonstrates: Reviewed above Telemetry:  Telemetry was personally reviewed and demonstrates: Rate controlled atrial fibrillation with PVCs  Relevant CV Studies:  Echocardiogram 05/08/2018:  Study Conclusions  - Left ventricle: Not well visualized. The cavity size was normal.   Wall thickness was normal. Systolic function was mildly to   moderately reduced. The estimated ejection fraction was in the   range of 40% to 45%. Diffuse hypokinesis. There is hypokinesis of   the anterolateral myocardium. Doppler parameters are consistent   with a reversible restrictive pattern, indicative of decreased   left ventricular diastolic compliance and/or increased left   atrial pressure (grade 3 diastolic dysfunction). - Mitral valve: There was mild regurgitation. - Left atrium: The atrium was moderately dilated. - Right atrium: The atrium was severely dilated. - Tricuspid valve: There was trivial regurgitation. - Pulmonary arteries: Systolic pressure was mildly increased. PA   peak pressure: 36 mm Hg (S).  Impressions:  - When compared to prior, EF has improved. (prior 30%)  Laboratory Data:  High Sensitivity Troponin:   Recent Labs  Lab 04/27/19 1716 04/27/19 2120   TROPONINIHS 11 12     Chemistry Recent Labs  Lab 04/27/19 1716 04/28/19 0437  NA 135 142  K 3.8 3.4*  CL 91* 92*  CO2 32 34*  GLUCOSE 255* 223*  BUN 62* 61*  CREATININE 1.22* 1.15*  CALCIUM 8.2* 8.6*  GFRNONAA 45* 49*  GFRAA 53* 57*  ANIONGAP 12 16*    No results for input(s): PROT, ALBUMIN, AST, ALT, ALKPHOS, BILITOT in the last 168 hours. Hematology Recent Labs  Lab 04/27/19 1716 04/28/19 0437  WBC 10.9* 10.8*  RBC 3.92 3.88  HGB 11.3* 11.0*  HCT 37.9 37.3  MCV 96.7 96.1  MCH 28.8 28.4  MCHC 29.8* 29.5*  RDW 13.4 13.3  PLT 213 203   BNP Recent Labs  Lab 04/27/19 1716  BNP 318.0*    DDimer No results for input(s): DDIMER in the last 168 hours.   Radiology/Studies:  Dg Chest 2 View  Result Date: 04/27/2019 CLINICAL DATA:  Shortness of breath and weight gain EXAM: CHEST - 2 VIEW COMPARISON:  01/22/2019 FINDINGS: Mild cardiomegaly. No overt pulmonary edema. No pneumothorax or pleural effusion. No focal airspace consolidation. IMPRESSION: Mild cardiomegaly without overt pulmonary edema. Electronically Signed   By: Ulyses Jarred M.D.   On: 04/27/2019 19:39    Assessment and Plan:   1.  Acute on chronic combined heart failure: She has a nonischemic cardiomyopathy with multiple cardiac catheterizations demonstrating nonobstructive disease.  She has been taking Lasix 80 mg twice daily and metolazone 5 mg daily as prescribed by her PCP as torsemide was reportedly not working.  She does have evidence of hypervolemia.  Currently on IV Lasix 60 mg twice daily and low-dose metolazone.  She had also been eating canned foods so I suspect there was some dietary indiscretion related to this.  She has also been on prednisone 5 mg daily for the past year (presumably for gout) which  can also lead to chronic fluid retention and weight gain which I would recommend discontinuing as this should only be used for acute exacerbations.  LVEF 40 to 45% by echocardiogram in December 2019.  I  will obtain a follow-up echocardiogram which can be performed tomorrow.  Continue Toprol-XL and losartan.  I am not entirely certain about torsemide failure versus the contributing factors mentioned above.  I would consider switching her back to torsemide and metolazone at the time of discharge, 80 mg twice daily and 5 mg daily respectively.  She says she will stop eating canned foods as well.  2.  Permanent atrial fibrillation: Heart rate is controlled on Toprol-XL.  Anticoagulated with warfarin.  3.  Hypertension: Continue to monitor given ongoing need for IV diuresis.  4.  COPD: Chronically on 3 L of oxygen.  This appears to be stable.  5.  Obstructive sleep apnea: Continue CPAP.  6.  Morbid obesity: Needs significant weight loss.  7.  Type 2 diabetes mellitus: A1c 9.9%.  As stated #1, I would stop daily prednisone.  Sliding scale insulin and Lantus has been ordered by internal medicine.    For questions or updates, please contact [place] Please consult [email or web address] for contact info under     Signed, Kate Sable, MD  04/28/2019 4:10 PM

## 2019-04-28 NOTE — Progress Notes (Signed)
Pt requesting nystatin powder to help with MASD under breasts. MD made aware. See new orders.

## 2019-04-28 NOTE — Progress Notes (Signed)
Inpatient Diabetes Program Recommendations  AACE/ADA: New Consensus Statement on Inpatient Glycemic Control   Target Ranges:  Prepandial:   less than 140 mg/dL      Peak postprandial:   less than 180 mg/dL (1-2 hours)      Critically ill patients:  140 - 180 mg/dL   Results for EVALUNA, UTKE (MRN [id number]) as of 04/28/2019 11:45  Ref. Range 04/28/2019 00:00 04/28/2019 08:00  Glucose-Capillary Latest Ref Range: 70 - 99 mg/dL 182 (H) 206 (H)   Review of Glycemic Control  Diabetes history: DM2 Outpatient Diabetes medications: Basaglar 60 units QHS, Humalog 10-16 units TID with meals Current orders for Inpatient glycemic control: Lantus 30 units QHS, Novolog 0-15 units TID with meals, Novolog 0-5 units QHS  Inpatient Diabetes Program Recommendations:   Insulin-Basal: Please consider increasing Lantus to 35 units QHS.  Thanks, Barnie Alderman, RN, MSN, CDE Diabetes Coordinator Inpatient Diabetes Program [phone] (Team Pager from 8am to 5pm)

## 2019-04-29 ENCOUNTER — Inpatient Hospital Stay (HOSPITAL_COMMUNITY): Payer: Medicare Other

## 2019-04-29 DIAGNOSIS — I5021 Acute systolic (congestive) heart failure: Secondary | ICD-10-CM

## 2019-04-29 LAB — BASIC METABOLIC PANEL
Anion gap: 15 (ref 5–15)
BUN: 57 mg/dL — ABNORMAL HIGH (ref 8–23)
CO2: 36 mmol/L — ABNORMAL HIGH (ref 22–32)
Calcium: 9 mg/dL (ref 8.9–10.3)
Chloride: 93 mmol/L — ABNORMAL LOW (ref 98–111)
Creatinine, Ser: 1.31 mg/dL — ABNORMAL HIGH (ref 0.44–1.00)
GFR calc Af Amer: 48 mL/min — ABNORMAL LOW (ref >60)
GFR calc non Af Amer: 42 mL/min — ABNORMAL LOW (ref >60)
Glucose, Bld: 167 mg/dL — ABNORMAL HIGH (ref 70–99)
Potassium: 3.3 mmol/L — ABNORMAL LOW (ref 3.5–5.1)
Sodium: 144 mmol/L (ref 135–145)

## 2019-04-29 LAB — GLUCOSE, CAPILLARY
Glucose-Capillary: 160 mg/dL — ABNORMAL HIGH (ref 70–99)
Glucose-Capillary: 202 mg/dL — ABNORMAL HIGH (ref 70–99)
Glucose-Capillary: 159 mg/dL — ABNORMAL HIGH (ref 70–99)
Glucose-Capillary: 160 mg/dL — ABNORMAL HIGH (ref 70–99)

## 2019-04-29 LAB — ECHOCARDIOGRAM COMPLETE
Height: 59 in
Weight: 3298.08 oz

## 2019-04-29 LAB — PROTIME-INR
INR: 2.2 — ABNORMAL HIGH (ref 0.8–1.2)
Prothrombin Time: 24.1 seconds — ABNORMAL HIGH (ref 11.4–15.2)

## 2019-04-29 MED ORDER — WARFARIN SODIUM 2 MG PO TABS
4.0000 mg | ORAL_TABLET | Freq: Once | ORAL | Status: AC
  Administered 2019-04-29: 4 mg via ORAL
  Filled 2019-04-29: qty 2

## 2019-04-29 NOTE — Plan of Care (Signed)
  Problem: Activity: Goal: Risk for activity intolerance will decrease Outcome: Progressing   Problem: Nutrition: Goal: Adequate nutrition will be maintained Outcome: Progressing   Problem: Coping: Goal: Level of anxiety will decrease Outcome: Progressing   

## 2019-04-29 NOTE — Plan of Care (Signed)

## 2019-04-29 NOTE — Progress Notes (Signed)
PROGRESS NOTE    Denise Freeman  UVO:[id number] DOB: Sep 30, 1950 DOA: 04/27/2019 PCP: Celene Squibb, MD     Brief Narrative:  As per H&P written by Dr. Myna Hidalgo on 04/27/2019 68 y.o. female with medical history significant for chronic combined systolic and diastolic CHF, atrial fibrillation on warfarin, COPD with chronic hypoxic respiratory failure, depression with anxiety, insulin-dependent diabetes mellitus, and chronic kidney disease stage III, now presenting to the emergency department for evaluation of progressive shortness of breath, leg swelling, and weight gain.  Patient reports that her diuretics were adjusted when she was discharged from the hospital 3 months ago, she does not feel like her respiratory status ever returned to her previous baseline since that admission, reports multiple adjustments to her medications in the interim, but reports progressive dyspnea, weight gain, and swelling, particularly over the past few days.  She denies any chest pain, fevers, chills, wheezing, or any significant coughing.  ED Course: Upon arrival to the ED, patient is found to be afebrile, saturating mid 90s on her usual 3 L/min of supplemental oxygen, and with stable blood pressure.  EKG features atrial fibrillation with PVCs and QTc interval of 523 ms.  Chest x-ray is notable for mild cardiomegaly without overt edema.  Chemistry panel features a BUN of 62 and creatinine 1.22, similar to priors.  CBC is notable for mild leukocytosis.  INR is therapeutic.  BNP is elevated to 318.  High-sensitivity troponin is normal x2.  Patient was given 80 mg IV Lasix in the ED.   Assessment & Plan: 1-acute on chronic combined systolic and diastolic CHF (congestive heart failure) ([place]) -Patient reports some diet indiscretion and lack of response to outpatient Demadex. -Continue to complain of shortness of breath and orthopnea -No chest pain, no palpitations, no nausea or vomiting. -Positive signs of fluid overload on  exam (positive crackles and increased lower extremity swelling). -Continue IV diuresis and metolazone -Continue daily weights, low-sodium diet and strict I's and O's -Cardiology service has been consulted. -Patient expressed lack of response of the use of torsemide 2 over 4-5 weeks of treatment as an outpatient.  She reported good response to the use of Lasix 80 mg 3 times a day along with metolazone.  Reluctant to use torsemide at discharge.  I believe she will benefit of referral to heart failure service at discharge.  2-Unspecified atrial fibrillation (HCC) -Rate control and is stable -Continue Coumadin for secondary prevention -Continue beta-blocker.  3-acute on chronic hypoxic, hypercapnic respiratory failure -continue CPAP QHS -continue oxygen supplementation -treating CHF exacerbation and will wean O2 sat down to baseline supplementation as tolerated.  4-Other emphysema ([place]) -Currently no wheezing -Continue home nebulizer regimen and Flonase  5-Mood disorder in conditions classified elsewhere -No hallucinations or suicidal ideation currently -Continue home antidepressant therapy  6-Uncontrolled type 2 diabetes mellitus with hyperglycemia (HCC) -Continue sliding scale insulin and Lantus -Modified carbohydrate diet has been discussed with patient.  7-Prolonged QT interval -Chronically follow on telemetry and minimize the use of medications that can prolong QT. -Follow closely electrolytes and replete as needed.  8-chronic kidney disease a stage IIIb -Appears to be secondary to diabetes -Continue to remains stable and at baseline -Closely follow renal function trend with acute diuresis process.  DVT prophylaxis: Coumadin Code Status: Full code Family Communication: No family at bedside. Disposition Plan: Remains inpatient for IV diuresis; follow cardiology service recommendations, continue to follow low-sodium diet, daily weights and strict I's and O's.  Consultants:    Cardiology service.  Procedures:   See below for x-ray reports.  2D echo: Results pending.  Antimicrobials:  Anti-infectives (From admission, onward)   None      Subjective: Reports an improvement in her urine output; no chest pain, no palpitations, no nausea, no vomiting.  Still expressed feeling short of breath, complaining of orthopnea and with signs of fluid overload on exam.  Objective: Vitals:   04/28/19 2109 04/29/19 0500 04/29/19 0600 04/29/19 1325  BP: 125/81  113/60 125/63  Pulse: 75  (!) 59 80  Resp: 18  18 16   Temp: 98.9 F (37.2 C)  97.6 F (36.4 C) 98.1 F (36.7 C)  TempSrc: Oral  Oral Oral  SpO2: 100%  98% 100%  Weight:  93.5 kg    Height:        Intake/Output Summary (Last 24 hours) at 04/29/2019 1424 Last data filed at 04/29/2019 0500 Gross per 24 hour  Intake 363 ml  Output 950 ml  Net -587 ml   Filed Weights   04/27/19 1617 04/28/19 0700 04/29/19 0500  Weight: 91.4 kg 94.5 kg 93.5 kg    Examination: General exam: Alert, awake, oriented x 3; still with signs of fluid overload, complaining of orthopnea, requiring 3 L nasal cannula supplementation and having difficulty speaking in full sentences. Respiratory system: Positive crackles at the bases, no wheezing, positive rhonchi; no using accessory muscles.   Cardiovascular system: Rate controlled.  No murmurs, no rubs, no gallops, unable to properly assess JVD with body habitus.  Gastrointestinal system: Abdomen is obese, nondistended, soft and nontender. No organomegaly or masses felt. Normal bowel sounds heard. Central nervous system: Alert and oriented. No focal neurological deficits. Extremities: No cyanosis or clubbing; 2+ edema bilaterally. Skin: No rashes, no petechiae. Psychiatry: Judgement and insight appear normal. Mood & affect appropriate.    Data Reviewed: I have personally reviewed following labs and imaging studies  CBC: Recent Labs  Lab 04/27/19 1716 04/28/19 0437  WBC  10.9* 10.8*  HGB 11.3* 11.0*  HCT 37.9 37.3  MCV 96.7 96.1  PLT 213 749   Basic Metabolic Panel: Recent Labs  Lab 04/27/19 1716 04/28/19 0437 04/29/19 0507  NA 135 142 144  K 3.8 3.4* 3.3*  CL 91* 92* 93*  CO2 32 34* 36*  GLUCOSE 255* 223* 167*  BUN 62* 61* 57*  CREATININE 1.22* 1.15* 1.31*  CALCIUM 8.2* 8.6* 9.0   GFR: Estimated Creatinine Clearance: 41.1 mL/min (A) (by C-G formula based on SCr of 1.31 mg/dL (H)).  Coagulation Profile: Recent Labs  Lab 04/27/19 1716 04/28/19 0437 04/29/19 0507  INR 2.7* 2.8* 2.2*   CBG: Recent Labs  Lab 04/28/19 1157 04/28/19 1629 04/28/19 2110 04/29/19 0747 04/29/19 1115  GLUCAP 166* 147* 171* 160* 202*   Urine analysis:    Component Value Date/Time   COLORURINE YELLOW 09/07/2016 1413   APPEARANCEUR HAZY (A) 09/07/2016 1413   LABSPEC 1.008 09/07/2016 1413   PHURINE 7.0 09/07/2016 1413   GLUCOSEU NEGATIVE 09/07/2016 1413   HGBUR NEGATIVE 09/07/2016 1413   BILIRUBINUR NEGATIVE 09/07/2016 1413   KETONESUR NEGATIVE 09/07/2016 1413   PROTEINUR 30 (A) 09/07/2016 1413   UROBILINOGEN 1.0 02/20/2015 1855   NITRITE NEGATIVE 09/07/2016 1413   LEUKOCYTESUR MODERATE (A) 09/07/2016 1413    Radiology Studies: Dg Chest 2 View  Result Date: 04/27/2019 CLINICAL DATA:  Shortness of breath and weight gain EXAM: CHEST - 2 VIEW COMPARISON:  01/22/2019 FINDINGS: Mild cardiomegaly. No overt pulmonary edema. No pneumothorax or pleural effusion. No focal airspace  consolidation. IMPRESSION: Mild cardiomegaly without overt pulmonary edema. Electronically Signed   By: Ulyses Jarred M.D.   On: 04/27/2019 19:39    Scheduled Meds: . allopurinol  100 mg Oral Daily  . budesonide  0.5 mg Nebulization BID  . escitalopram  10 mg Oral QHS  . fluticasone  1 spray Each Nare BID  . furosemide  60 mg Intravenous Q12H  . insulin aspart  0-15 Units Subcutaneous TID WC  . insulin aspart  0-5 Units Subcutaneous QHS  . insulin glargine  45 Units  Subcutaneous QHS  . losartan  12.5 mg Oral Daily  . metolazone  2.5 mg Oral Daily  . metoprolol succinate  25 mg Oral BID  . montelukast  10 mg Oral QHS  . pantoprazole  40 mg Oral Daily  . potassium chloride  20 mEq Oral BID  . predniSONE  5 mg Oral Q breakfast  . primidone  50 mg Oral QHS  . sodium chloride flush  3 mL Intravenous Q12H  . warfarin  4 mg Oral Once  . Warfarin - Pharmacist Dosing Inpatient   Does not apply q1800   Continuous Infusions: . sodium chloride       LOS: 1 day    Time spent: 30 minutes.   Barton Dubois, MD [facility] Pager [phone]  04/29/2019, 2:24 PM

## 2019-04-29 NOTE — Progress Notes (Signed)
*  PRELIMINARY RESULTS* Echocardiogram 2D Echocardiogram has been performed.  Denise Freeman 04/29/2019, 12:12 PM

## 2019-04-29 NOTE — Progress Notes (Signed)
ANTICOAGULATION CONSULT NOTE -   Pharmacy Consult for warfarin dosing Indication: atrial fibrillation  Drug interactions: n/a Concurrent anti-platelet drugs: n/a  Allergies  Allergen Reactions  . Xarelto [Rivaroxaban] Other (See Comments)    Internal bleeding  . Ancef [Cefazolin] Nausea And Vomiting  . Levaquin [Levofloxacin In D5w] Other (See Comments)    "afib"  . Lyrica [Pregabalin] Hives  . Tamiflu [Oseltamivir Phosphate] Other (See Comments)    "water blisters"  . Zoloft [Sertraline Hcl] Other (See Comments)    Jaw problems, jittery  . Augmentin [Amoxicillin-Pot Clavulanate] Itching  . Ciprofloxacin Itching and Nausea And Vomiting  . Haldol [Haloperidol] Other (See Comments)    Restless leg  . Nsaids Diarrhea  . Penicillins Itching, Nausea And Vomiting and Rash    Has patient had a PCN reaction causing immediate rash, facial/tongue/throat swelling, SOB or lightheadedness with hypotension: Yes Has patient had a PCN reaction causing severe rash involving mucus membranes or skin necrosis: No Has patient had a PCN reaction that required hospitalization No Has patient had a PCN reaction occurring within the last 10 years: Yes If all of the above answers are "NO", then may proceed with Cephalosporin use.  . Topamax [Topiramate] Nausea Only    Patient Measurements: Height: 4\' 11"  (149.9 cm) Weight: 206 lb 2.1 oz (93.5 kg) IBW/kg (Calculated) : 43.2  HEPARIN DW (KG): 66.2  Vital Signs: Temp: 97.6 F (36.4 C) (11/26 0600) Temp Source: Oral (11/26 0600) BP: 113/60 (11/26 0600) Pulse Rate: 59 (11/26 0600)  Labs: Recent Labs    04/27/19 1716 04/27/19 2120 04/28/19 0437 04/29/19 0507  HGB 11.3*  --  11.0*  --   HCT 37.9  --  37.3  --   PLT 213  --  203  --   LABPROT 28.7*  --  29.5* 24.1*  INR 2.7*  --  2.8* 2.2*  CREATININE 1.22*  --  1.15* 1.31*  TROPONINIHS 11 12  --   --     Estimated Creatinine Clearance: 41.1 mL/min (A) (by C-G formula based on SCr of 1.31  mg/dL (H)).   Assessment: Pharmacy consulted to dose warfarin for this 68 yo female  on chronic warfarin therapy with atrial fibrillation.  She had internal bleeding in the past with Xarelto.  CBC is within normal limits and stable. = INR remains therapeutic at 2.2  Home dose:   4mg  daily except for 2mg  on Thursdays/Sundays   Goal of Therapy:  INR 2-3 Monitor platelets by anticoagulation protocol: Yes   Plan:  Coumadin 4mg  po x 1 today Daily PT/INR and every other day CBC Monitor for signs and symptoms of bleeding   Margot Ables, PharmD Clinical Pharmacist 04/29/2019 7:56 AM

## 2019-04-30 DIAGNOSIS — I428 Other cardiomyopathies: Secondary | ICD-10-CM

## 2019-04-30 DIAGNOSIS — Z9989 Dependence on other enabling machines and devices: Secondary | ICD-10-CM

## 2019-04-30 DIAGNOSIS — I5033 Acute on chronic diastolic (congestive) heart failure: Secondary | ICD-10-CM

## 2019-04-30 LAB — BASIC METABOLIC PANEL
Anion gap: 12 (ref 5–15)
BUN: 56 mg/dL — ABNORMAL HIGH (ref 8–23)
CO2: 37 mmol/L — ABNORMAL HIGH (ref 22–32)
Calcium: 8.6 mg/dL — ABNORMAL LOW (ref 8.9–10.3)
Chloride: 95 mmol/L — ABNORMAL LOW (ref 98–111)
Creatinine, Ser: 1.15 mg/dL — ABNORMAL HIGH (ref 0.44–1.00)
GFR calc Af Amer: 57 mL/min — ABNORMAL LOW (ref >60)
GFR calc non Af Amer: 49 mL/min — ABNORMAL LOW (ref >60)
Glucose, Bld: 115 mg/dL — ABNORMAL HIGH (ref 70–99)
Potassium: 3.6 mmol/L (ref 3.5–5.1)
Sodium: 144 mmol/L (ref 135–145)

## 2019-04-30 LAB — CBC
HCT: 38.4 % (ref 36.0–46.0)
Hemoglobin: 11.1 g/dL — ABNORMAL LOW (ref 12.0–15.0)
MCH: 28.5 pg (ref 26.0–34.0)
MCHC: 28.9 g/dL — ABNORMAL LOW (ref 30.0–36.0)
MCV: 98.7 fL (ref 80.0–100.0)
Platelets: 186 10*3/uL (ref 150–400)
RBC: 3.89 MIL/uL (ref 3.87–5.11)
RDW: 13.3 % (ref 11.5–15.5)
WBC: 8.8 10*3/uL (ref 4.0–10.5)
nRBC: 0 % (ref 0.0–0.2)

## 2019-04-30 LAB — PROTIME-INR
INR: 2.2 — ABNORMAL HIGH (ref 0.8–1.2)
Prothrombin Time: 24.4 seconds — ABNORMAL HIGH (ref 11.4–15.2)

## 2019-04-30 LAB — GLUCOSE, CAPILLARY
Glucose-Capillary: 105 mg/dL — ABNORMAL HIGH (ref 70–99)
Glucose-Capillary: 258 mg/dL — ABNORMAL HIGH (ref 70–99)
Glucose-Capillary: 173 mg/dL — ABNORMAL HIGH (ref 70–99)
Glucose-Capillary: 159 mg/dL — ABNORMAL HIGH (ref 70–99)

## 2019-04-30 MED ORDER — LORATADINE 10 MG PO TABS
10.0000 mg | ORAL_TABLET | Freq: Every day | ORAL | Status: DC
  Administered 2019-04-30 – 2019-05-02 (×3): 10 mg via ORAL
  Filled 2019-04-30 (×3): qty 1

## 2019-04-30 MED ORDER — WARFARIN SODIUM 2 MG PO TABS
4.0000 mg | ORAL_TABLET | Freq: Once | ORAL | Status: AC
  Administered 2019-04-30: 4 mg via ORAL
  Filled 2019-04-30: qty 2

## 2019-04-30 NOTE — Progress Notes (Signed)
Progress Note  Patient Name: Denise Freeman Date of Encounter: 04/30/2019  Primary Cardiologist: Pixie Casino, MD   Subjective   Feeling better each day. Legs less swollen. Husband present. They are both committed to eating fresh and frozen vegetables and not eating canned foods anymore.  Inpatient Medications    Scheduled Meds: . allopurinol  100 mg Oral Daily  . budesonide  0.5 mg Nebulization BID  . escitalopram  10 mg Oral QHS  . fluticasone  1 spray Each Nare BID  . furosemide  60 mg Intravenous Q12H  . insulin aspart  0-15 Units Subcutaneous TID WC  . insulin aspart  0-5 Units Subcutaneous QHS  . insulin glargine  45 Units Subcutaneous QHS  . losartan  12.5 mg Oral Daily  . metolazone  2.5 mg Oral Daily  . metoprolol succinate  25 mg Oral BID  . montelukast  10 mg Oral QHS  . pantoprazole  40 mg Oral Daily  . potassium chloride  20 mEq Oral BID  . predniSONE  5 mg Oral Q breakfast  . primidone  50 mg Oral QHS  . sodium chloride flush  3 mL Intravenous Q12H  . warfarin  4 mg Oral Once  . Warfarin - Pharmacist Dosing Inpatient   Does not apply q1800   Continuous Infusions: . sodium chloride     PRN Meds: sodium chloride, acetaminophen, albuterol, nystatin, oxyCODONE-acetaminophen, sodium chloride flush   Vital Signs    Vitals:   04/29/19 2121 04/30/19 0005 04/30/19 0500 04/30/19 0557  BP: 126/65   123/69  Pulse: 73   (!) 54  Resp: 18 16  20   Temp: 99.3 F (37.4 C)   (!) 97.5 F (36.4 C)  TempSrc: Oral   Oral  SpO2: 97% 98%  96%  Weight:   93.3 kg   Height:        Intake/Output Summary (Last 24 hours) at 04/30/2019 1053 Last data filed at 04/30/2019 0354 Gross per 24 hour  Intake 360 ml  Output 1150 ml  Net -790 ml   Filed Weights   04/28/19 0700 04/29/19 0500 04/30/19 0500  Weight: 94.5 kg 93.5 kg 93.3 kg    Telemetry    Rate controlled atrial fibrillation - Personally Reviewed   Physical Exam   GEN: No acute distress.   Neck: No  JVD Cardiac: Irregular rhythm, no murmurs, rubs, or gallops.  Respiratory: Clear to auscultation bilaterally. GI: Soft, nontender, non-distended  MS: 1+ pitting b/l leg edema; No deformity. Neuro:  Nonfocal  Psych: Normal affect   Labs    Chemistry Recent Labs  Lab 04/28/19 0437 04/29/19 0507 04/30/19 0502  NA 142 144 144  K 3.4* 3.3* 3.6  CL 92* 93* 95*  CO2 34* 36* 37*  GLUCOSE 223* 167* 115*  BUN 61* 57* 56*  CREATININE 1.15* 1.31* 1.15*  CALCIUM 8.6* 9.0 8.6*  GFRNONAA 49* 42* 49*  GFRAA 57* 48* 57*  ANIONGAP 16* 15 12     Hematology Recent Labs  Lab 04/27/19 1716 04/28/19 0437 04/30/19 0502  WBC 10.9* 10.8* 8.8  RBC 3.92 3.88 3.89  HGB 11.3* 11.0* 11.1*  HCT 37.9 37.3 38.4  MCV 96.7 96.1 98.7  MCH 28.8 28.4 28.5  MCHC 29.8* 29.5* 28.9*  RDW 13.4 13.3 13.3  PLT 213 203 186    Cardiac EnzymesNo results for input(s): TROPONINI in the last 168 hours. No results for input(s): TROPIPOC in the last 168 hours.   BNP Recent Labs  Lab 04/27/19 1716  BNP 318.0*     DDimer No results for input(s): DDIMER in the last 168 hours.   Radiology    No results found.  Cardiac Studies   Echocardiogram 04/29/19:   1. Left ventricular ejection fraction, by visual estimation, is 50 to 55%. The left ventricle has low normal function. There is mildly increased left ventricular hypertrophy.  2. Elevated left atrial pressure.  3. Left ventricular diastolic parameters are consistent with Grade II diastolic dysfunction (pseudonormalization).  4. Global right ventricle has mildly reduced systolic function.The right ventricular size is moderately enlarged. No increase in right ventricular wall thickness.  5. Left atrial size was moderately dilated.  6. Right atrial size was moderately dilated.  7. The mitral valve is normal in structure. No evidence of mitral valve regurgitation. No evidence of mitral stenosis.  8. The tricuspid valve is normal in structure. Tricuspid  valve regurgitation is trivial.  9. The aortic valve has an indeterminant number of cusps. Aortic valve regurgitation is not visualized. No evidence of aortic valve sclerosis or stenosis. 10. The pulmonic valve was not well visualized. Pulmonic valve regurgitation is mild. 11. Mildly elevated pulmonary artery systolic pressure. 12. The inferior vena cava is dilated in size with >50% respiratory variability, suggesting right atrial pressure of 8 mmHg.   Patient Profile     68 y.o. female with a hx of chronic combined heart failure and atrial fibrillation who is being seen today for the evaluation of acute on chronic combined CHF at the request of Dr Dyann Kief.  Assessment & Plan    1.  Acute on chronic diastolic heart failure: She has a history of nonischemic cardiomyopathy with multiple cardiac catheterizations demonstrating nonobstructive disease.  She has been taking Lasix 80 mg twice daily and metolazone 5 mg daily as prescribed by her PCP as torsemide was reportedly not working.    Remains on IV Lasix 60 mg twice daily and low-dose metolazone. 790 cc output in last 24 hrs.  She had also been eating canned foods so I suspect there was some dietary indiscretion related to this.  She has also been on prednisone 5 mg daily for the past year (presumably for gout) which can also lead to chronic fluid retention and weight gain which I would recommend discontinuing as this should only be used for acute exacerbations.  LVEF up to 50-55% by echocardiogram yesterday, with grade 2 diastolic dysfunction and mildly reduced RV fucntion.  Continue Toprol-XL and losartan (titrate up losartan to 25 mg as BP allows).  I am not entirely certain about torsemide failure versus the contributing factors mentioned above.   She insists on avoiding torsemide and would prefer to use Lasix tid as she had been in the past as prescribed by [name]. I would consider putting her back on Lasix 80 mg tid and metolazone 2.5 mg  daily at the time of discharge. She says she will stop eating canned foods as well.  2.  Permanent atrial fibrillation: Heart rate is controlled on Toprol-XL.  Anticoagulated with warfarin.  3.  Hypertension: Normal today. Continue to monitor given ongoing need for IV diuresis.  4.  COPD: Chronically on 3 L of oxygen.  This appears to be stable.  5.  Obstructive sleep apnea: Continue CPAP.  6.  Morbid obesity: Needs significant weight loss.  7.  Type 2 diabetes mellitus: A1c 9.9%.  As stated #1, I would stop daily prednisone.  Sliding scale insulin and Lantus has been ordered by  internal medicine.    For questions or updates, please contact [place] Please consult [email or web address] for contact info under Cardiology/STEMI.      Signed, Kate Sable, MD  04/30/2019, 10:53 AM

## 2019-04-30 NOTE — TOC Progression Note (Addendum)
Transition of Care [facility]) - Progression Note    Patient Details  Name: Denise Freeman MRN: [id number] Date of Birth: 1951-02-24  Transition of Care [facility] CM/SW Contact  Lyrick Lagrand, Chauncey Reading, RN Phone Number: 04/30/2019, 11:25 AM  Clinical Narrative:   [facility] flyer given and consult placed for [facility] case management services. Notified by Vaughan Basta of [facility] that patient is active with RN services. Will need resumption orders.     Expected Discharge Plan: Home/Self Care Barriers to Discharge: Continued Medical Work up  Expected Discharge Plan and Services Expected Discharge Plan: Home/Self Care   Discharge Planning Services: CM Consult   Living arrangements for the past 2 months: Single Family Home                                       Social Determinants of Health (SDOH) Interventions    Readmission Risk Interventions Readmission Risk Prevention Plan 01/25/2019  Transportation Screening Complete  PCP or Specialist Appt within 3-5 Days Complete  HRI or [place] Complete  Social Work Consult for [place] Planning/Counseling Complete  Palliative Care Screening Not Applicable  Medication Review [employer]) Complete  Some recent data might be hidden

## 2019-04-30 NOTE — Progress Notes (Signed)
PROGRESS NOTE    Denise Freeman  WUJ:[id number] DOB: December 08, 1950 DOA: 04/27/2019 PCP: Celene Squibb, MD     Brief Narrative:  As per H&P written by Dr. Myna Hidalgo on 04/27/2019 68 y.o. female with medical history significant for chronic combined systolic and diastolic CHF, atrial fibrillation on warfarin, COPD with chronic hypoxic respiratory failure, depression with anxiety, insulin-dependent diabetes mellitus, and chronic kidney disease stage III, now presenting to the emergency department for evaluation of progressive shortness of breath, leg swelling, and weight gain.  Patient reports that her diuretics were adjusted when she was discharged from the hospital 3 months ago, she does not feel like her respiratory status ever returned to her previous baseline since that admission, reports multiple adjustments to her medications in the interim, but reports progressive dyspnea, weight gain, and swelling, particularly over the past few days.  She denies any chest pain, fevers, chills, wheezing, or any significant coughing.  ED Course: Upon arrival to the ED, patient is found to be afebrile, saturating mid 90s on her usual 3 L/min of supplemental oxygen, and with stable blood pressure.  EKG features atrial fibrillation with PVCs and QTc interval of 523 ms.  Chest x-ray is notable for mild cardiomegaly without overt edema.  Chemistry panel features a BUN of 62 and creatinine 1.22, similar to priors.  CBC is notable for mild leukocytosis.  INR is therapeutic.  BNP is elevated to 318.  High-sensitivity troponin is normal x2.  Patient was given 80 mg IV Lasix in the ED.   Assessment & Plan: 1-acute on chronic combined systolic and diastolic CHF (congestive heart failure) ([place]) -Patient reports some diet indiscretion and lack of response to outpatient Demadex. -Continue to complain of shortness of breath and orthopnea -No chest pain, no palpitations, no nausea or vomiting. -Positive signs of fluid overload on  exam (positive crackles and increased lower extremity swelling). -Continue IV diuresis and metolazone -Overall 5.5 L negative balance since admission. -Continue daily weights, low-sodium diet and strict I's and O's -Cardiology service has been consulted. -Patient expressed lack of response of the use of torsemide 2 over 4-5 weeks of treatment as an outpatient.  She reported good response to the use of Lasix 80 mg 3 times a day along with metolazone in the past.  Reluctant to use torsemide at discharge.  I believe she will benefit of referral to heart failure service at discharge.  2-Unspecified atrial fibrillation (HCC) -Rate control and is stable -Continue Coumadin for secondary prevention -Continue beta-blocker.  3-acute on chronic hypoxic, hypercapnic respiratory failure -continue CPAP QHS -continue oxygen supplementation -treating CHF exacerbation and will wean O2 sat down to baseline supplementation as tolerated.  4-Other emphysema ([place]) -Currently no wheezing -Continue home nebulizer regimen and Flonase  5-Mood disorder in conditions classified elsewhere -No hallucinations or suicidal ideation currently -Continue home antidepressant therapy  6-Uncontrolled type 2 diabetes mellitus with hyperglycemia (HCC) -Continue sliding scale insulin and Lantus -Modified carbohydrate diet has been discussed with patient.  7-Prolonged QT interval -Chronically follow on telemetry and minimize the use of medications that can prolong QT. -Follow closely electrolytes and replete as needed.  8-chronic kidney disease a stage IIIb -Appears to be secondary to diabetes -Continue to remains stable and at baseline -Closely follow renal function trend with acute diuresis process.  DVT prophylaxis: Coumadin Code Status: Full code Family Communication: No family at bedside. Disposition Plan: Remains inpatient for IV diuresis; follow cardiology service recommendations, continue to follow low-sodium  diet, daily weights and strict  I's and O's.  Consultants:   Cardiology service.  Procedures:   See below for x-ray reports.  2D echo:   1. Left ventricular ejection fraction, by visual estimation, is 50 to 55%. The left ventricle has low normal function. There is mildly increased left ventricular hypertrophy.  2. Elevated left atrial pressure.  3. Left ventricular diastolic parameters are consistent with Grade II diastolic dysfunction (pseudonormalization).  4. Global right ventricle has mildly reduced systolic function.The right ventricular size is moderately enlarged. No increase in right ventricular wall thickness.  5. Left atrial size was moderately dilated.  6. Right atrial size was moderately dilated.  7. The mitral valve is normal in structure. No evidence of mitral valve regurgitation. No evidence of mitral stenosis.  8. The tricuspid valve is normal in structure. Tricuspid valve regurgitation is trivial.  9. The aortic valve has an indeterminant number of cusps. Aortic valve regurgitation is not visualized. No evidence of aortic valve sclerosis or stenosis. 10. The pulmonic valve was not well visualized. Pulmonic valve regurgitation is mild. 11. Mildly elevated pulmonary artery systolic pressure. 12. The inferior vena cava is dilated in size with >50% respiratory variability, suggesting right atrial pressure of 8 mmHg.  Antimicrobials:  Anti-infectives (From admission, onward)   None      Subjective: Reports breathing is improving.  Still short of breath on exertion and with mild orthopnea.  No nausea, no vomiting, no chest pain.  Objective: Vitals:   04/29/19 2121 04/30/19 0005 04/30/19 0500 04/30/19 0557  BP: 126/65   123/69  Pulse: 73   (!) 54  Resp: 18 16  20   Temp: 99.3 F (37.4 C)   (!) 97.5 F (36.4 C)  TempSrc: Oral   Oral  SpO2: 97% 98%  96%  Weight:   93.3 kg   Height:        Intake/Output Summary (Last 24 hours) at 04/30/2019 1237 Last data filed  at 04/30/2019 8295 Gross per 24 hour  Intake 360 ml  Output 1150 ml  Net -790 ml   Filed Weights   04/28/19 0700 04/29/19 0500 04/30/19 0500  Weight: 94.5 kg 93.5 kg 93.3 kg    Examination: General exam: Alert, awake, oriented x 3, feeling better and denying chest pain.  Still with signs of fluid overload and complaining of mild orthopnea.  No nausea, no vomiting. Respiratory system: Fine crackles at the bases, improved air movement bilaterally; no wheezing.  No using accessory muscles.  Good O2 sat on chronic oxygen supplementation.   Cardiovascular system: Rate controlled. No murmurs, rubs, gallops.  Unable to properly assess JVD due to body habitus. Gastrointestinal system: Abdomen is obese, nondistended, soft and nontender. No organomegaly or masses felt. Normal bowel sounds heard. Central nervous system: Alert and oriented. No focal neurological deficits. Extremities: No cyanosis or clubbing; 1+ edema bilaterally. Skin: No rashes, no petechiae. Psychiatry: Judgement and insight appear normal. Mood & affect appropriate.    Data Reviewed: I have personally reviewed following labs and imaging studies  CBC: Recent Labs  Lab 04/27/19 1716 04/28/19 0437 04/30/19 0502  WBC 10.9* 10.8* 8.8  HGB 11.3* 11.0* 11.1*  HCT 37.9 37.3 38.4  MCV 96.7 96.1 98.7  PLT 213 203 621   Basic Metabolic Panel: Recent Labs  Lab 04/27/19 1716 04/28/19 0437 04/29/19 0507 04/30/19 0502  NA 135 142 144 144  K 3.8 3.4* 3.3* 3.6  CL 91* 92* 93* 95*  CO2 32 34* 36* 37*  GLUCOSE 255* 223* 167* 115*  BUN 62* 61* 57* 56*  CREATININE 1.22* 1.15* 1.31* 1.15*  CALCIUM 8.2* 8.6* 9.0 8.6*   GFR: Estimated Creatinine Clearance: 46.7 mL/min (A) (by C-G formula based on SCr of 1.15 mg/dL (H)).  Coagulation Profile: Recent Labs  Lab 04/27/19 1716 04/28/19 0437 04/29/19 0507 04/30/19 0502  INR 2.7* 2.8* 2.2* 2.2*   CBG: Recent Labs  Lab 04/29/19 1115 04/29/19 1612 04/29/19 2118 04/30/19  0719 04/30/19 1117  GLUCAP 202* 159* 160* 105* 258*   Radiology Studies: No results found.  Scheduled Meds: . allopurinol  100 mg Oral Daily  . budesonide  0.5 mg Nebulization BID  . escitalopram  10 mg Oral QHS  . fluticasone  1 spray Each Nare BID  . furosemide  60 mg Intravenous Q12H  . insulin aspart  0-15 Units Subcutaneous TID WC  . insulin aspart  0-5 Units Subcutaneous QHS  . insulin glargine  45 Units Subcutaneous QHS  . losartan  12.5 mg Oral Daily  . metolazone  2.5 mg Oral Daily  . metoprolol succinate  25 mg Oral BID  . montelukast  10 mg Oral QHS  . pantoprazole  40 mg Oral Daily  . potassium chloride  20 mEq Oral BID  . primidone  50 mg Oral QHS  . sodium chloride flush  3 mL Intravenous Q12H  . warfarin  4 mg Oral Once  . Warfarin - Pharmacist Dosing Inpatient   Does not apply q1800   Continuous Infusions: . sodium chloride       LOS: 2 days    Time spent: 30 minutes.   Barton Dubois, MD [facility] Pager [phone]  04/30/2019, 12:37 PM

## 2019-04-30 NOTE — Progress Notes (Signed)
Central Tele called and noted that the patient had 6 runs of Vtach. Patient was sitting on side of the bed talking with no complaints of chest pain or shortness of breath. MD notified.

## 2019-04-30 NOTE — Care Management Important Message (Signed)
Important Message  Patient Details  Name: Denise Freeman MRN: [id number] Date of Birth: [birth year]   Medicare Important Message Given:  Yes     Tommy Medal 04/30/2019, 3:37 PM

## 2019-04-30 NOTE — Progress Notes (Signed)
ANTICOAGULATION CONSULT NOTE -   Pharmacy Consult for warfarin dosing Indication: atrial fibrillation  Drug interactions: n/a Concurrent anti-platelet drugs: n/a  Allergies  Allergen Reactions  . Xarelto [Rivaroxaban] Other (See Comments)    Internal bleeding  . Ancef [Cefazolin] Nausea And Vomiting  . Levaquin [Levofloxacin In D5w] Other (See Comments)    "afib"  . Lyrica [Pregabalin] Hives  . Tamiflu [Oseltamivir Phosphate] Other (See Comments)    "water blisters"  . Zoloft [Sertraline Hcl] Other (See Comments)    Jaw problems, jittery  . Augmentin [Amoxicillin-Pot Clavulanate] Itching  . Ciprofloxacin Itching and Nausea And Vomiting  . Haldol [Haloperidol] Other (See Comments)    Restless leg  . Nsaids Diarrhea  . Penicillins Itching, Nausea And Vomiting and Rash    Has patient had a PCN reaction causing immediate rash, facial/tongue/throat swelling, SOB or lightheadedness with hypotension: Yes Has patient had a PCN reaction causing severe rash involving mucus membranes or skin necrosis: No Has patient had a PCN reaction that required hospitalization No Has patient had a PCN reaction occurring within the last 10 years: Yes If all of the above answers are "NO", then may proceed with Cephalosporin use.  . Topamax [Topiramate] Nausea Only    Patient Measurements: Height: 4\' 11"  (149.9 cm) Weight: 205 lb 11 oz (93.3 kg) IBW/kg (Calculated) : 43.2  HEPARIN DW (KG): 66.2  Vital Signs: Temp: 97.5 F (36.4 C) (11/27 0557) Temp Source: Oral (11/27 0557) BP: 123/69 (11/27 0557) Pulse Rate: 54 (11/27 0557)  Labs: Recent Labs    04/27/19 1716 04/27/19 2120 04/28/19 0437 04/29/19 0507 04/30/19 0502  HGB 11.3*  --  11.0*  --  11.1*  HCT 37.9  --  37.3  --  38.4  PLT 213  --  203  --  186  LABPROT 28.7*  --  29.5* 24.1* 24.4*  INR 2.7*  --  2.8* 2.2* 2.2*  CREATININE 1.22*  --  1.15* 1.31* 1.15*  TROPONINIHS 11 12  --   --   --     Estimated Creatinine Clearance:  46.7 mL/min (A) (by C-G formula based on SCr of 1.15 mg/dL (H)).   Assessment: Pharmacy consulted to dose warfarin for this 68 yo female  on chronic warfarin therapy with atrial fibrillation.  She had internal bleeding in the past with Xarelto.  CBC is within normal limits and stable.  INR remains therapeutic at 2.2  Home dose:   4mg  daily except for 2mg  on Thursdays/Sundays   Goal of Therapy:  INR 2-3 Monitor platelets by anticoagulation protocol: Yes   Plan:  Coumadin 4mg  po x 1 today Daily PT/INR and every other day CBC Monitor for signs and symptoms of bleeding   Margot Ables, PharmD Clinical Pharmacist 04/30/2019 7:48 AM

## 2019-04-30 NOTE — Plan of Care (Signed)

## 2019-05-01 LAB — BASIC METABOLIC PANEL
Anion gap: 9 (ref 5–15)
BUN: 56 mg/dL — ABNORMAL HIGH (ref 8–23)
CO2: 42 mmol/L — ABNORMAL HIGH (ref 22–32)
Calcium: 8.4 mg/dL — ABNORMAL LOW (ref 8.9–10.3)
Chloride: 94 mmol/L — ABNORMAL LOW (ref 98–111)
Creatinine, Ser: 1.16 mg/dL — ABNORMAL HIGH (ref 0.44–1.00)
GFR calc Af Amer: 56 mL/min — ABNORMAL LOW (ref >60)
GFR calc non Af Amer: 48 mL/min — ABNORMAL LOW (ref >60)
Glucose, Bld: 80 mg/dL (ref 70–99)
Potassium: 3.4 mmol/L — ABNORMAL LOW (ref 3.5–5.1)
Sodium: 145 mmol/L (ref 135–145)

## 2019-05-01 LAB — PROTIME-INR
INR: 2.2 — ABNORMAL HIGH (ref 0.8–1.2)
Prothrombin Time: 24.1 seconds — ABNORMAL HIGH (ref 11.4–15.2)

## 2019-05-01 LAB — GLUCOSE, CAPILLARY
Glucose-Capillary: 99 mg/dL (ref 70–99)
Glucose-Capillary: 114 mg/dL — ABNORMAL HIGH (ref 70–99)
Glucose-Capillary: 179 mg/dL — ABNORMAL HIGH (ref 70–99)
Glucose-Capillary: 153 mg/dL — ABNORMAL HIGH (ref 70–99)

## 2019-05-01 MED ORDER — WARFARIN SODIUM 2 MG PO TABS
4.0000 mg | ORAL_TABLET | Freq: Once | ORAL | Status: AC
  Administered 2019-05-01: 4 mg via ORAL
  Filled 2019-05-01: qty 2

## 2019-05-01 NOTE — Progress Notes (Signed)
Pt sitting on the edge of bed. 3L O2 via nasal cannula. RN assisted pt with getting CPAP on and into the bed. Pt requested ice chips. RN will continue to monitor.

## 2019-05-01 NOTE — Progress Notes (Signed)
PROGRESS NOTE    Ellice B[place]  DEY:[id number] DOB: Jan 10, 1951 DOA: 04/27/2019 PCP: Celene Squibb, MD     Brief Narrative:  As per H&P written by Dr. Myna Hidalgo on 04/27/2019 68 y.o. female with medical history significant for chronic combined systolic and diastolic CHF, atrial fibrillation on warfarin, COPD with chronic hypoxic respiratory failure, depression with anxiety, insulin-dependent diabetes mellitus, and chronic kidney disease stage III, now presenting to the emergency department for evaluation of progressive shortness of breath, leg swelling, and weight gain.  Patient reports that her diuretics were adjusted when she was discharged from the hospital 3 months ago, she does not feel like her respiratory status ever returned to her previous baseline since that admission, reports multiple adjustments to her medications in the interim, but reports progressive dyspnea, weight gain, and swelling, particularly over the past few days.  She denies any chest pain, fevers, chills, wheezing, or any significant coughing.  ED Course: Upon arrival to the ED, patient is found to be afebrile, saturating mid 90s on her usual 3 L/min of supplemental oxygen, and with stable blood pressure.  EKG features atrial fibrillation with PVCs and QTc interval of 523 ms.  Chest x-ray is notable for mild cardiomegaly without overt edema.  Chemistry panel features a BUN of 62 and creatinine 1.22, similar to priors.  CBC is notable for mild leukocytosis.  INR is therapeutic.  BNP is elevated to 318.  High-sensitivity troponin is normal x2.  Patient was given 80 mg IV Lasix in the ED.   Assessment & Plan: 1-acute on chronic combined systolic and diastolic CHF (congestive heart failure) ([place]) -Patient reports some diet indiscretion and lack of response to outpatient Demadex. -Continue to complain of shortness of breath and orthopnea -No chest pain, no palpitations, no nausea or vomiting. -Positive signs of fluid overload on  exam (positive crackles and increased lower extremity swelling). -Continue IV diuresis and metolazone -Overall 6.5 L negative balance since admission; approximately 6.6 pounds off. -Continue daily weights, low-sodium diet and strict I's and O's -Cardiology service has been consulted. -Patient expressed lack of response of the use of torsemide 2 over 4-5 weeks of treatment as an outpatient.  She reported good response to the use of Lasix 80 mg 3 times a day along with metolazone in the past.  Reluctant to use torsemide at discharge.  I believe she will benefit of referral to heart failure service at discharge.  2-Unspecified atrial fibrillation (HCC) -Rate control and is stable -Continue Coumadin for secondary prevention -Continue beta-blocker.  3-acute on chronic hypoxic, hypercapnic respiratory failure -continue CPAP QHS -continue oxygen supplementation -treating CHF exacerbation and will wean O2 sat down to baseline supplementation as tolerated.  4-Other emphysema ([place]) -Currently no wheezing -Continue home nebulizer regimen and Flonase  5-Mood disorder in conditions classified elsewhere -No hallucinations or suicidal ideation currently -Continue home antidepressant therapy  6-Uncontrolled type 2 diabetes mellitus with hyperglycemia (HCC) -Continue sliding scale insulin and Lantus -Modified carbohydrate diet has been discussed with patient.  7-Prolonged QT interval -Chronically follow on telemetry and minimize the use of medications that can prolong QT. -Follow closely electrolytes and replete as needed.  8-chronic kidney disease a stage IIIb -Appears to be secondary to diabetes -Continue to remains stable and at baseline -Closely follow renal function trend with acute diuresis process.  DVT prophylaxis: Coumadin Code Status: Full code Family Communication: No family at bedside. Disposition Plan: Remains inpatient for IV diuresis; follow cardiology service recommendations,  continue to follow low-sodium diet,  daily weights and strict I's and O's.  Consultants:   Cardiology service.  Procedures:   See below for x-ray reports.  2D echo:   1. Left ventricular ejection fraction, by visual estimation, is 50 to 55%. The left ventricle has low normal function. There is mildly increased left ventricular hypertrophy.  2. Elevated left atrial pressure.  3. Left ventricular diastolic parameters are consistent with Grade II diastolic dysfunction (pseudonormalization).  4. Global right ventricle has mildly reduced systolic function.The right ventricular size is moderately enlarged. No increase in right ventricular wall thickness.  5. Left atrial size was moderately dilated.  6. Right atrial size was moderately dilated.  7. The mitral valve is normal in structure. No evidence of mitral valve regurgitation. No evidence of mitral stenosis.  8. The tricuspid valve is normal in structure. Tricuspid valve regurgitation is trivial.  9. The aortic valve has an indeterminant number of cusps. Aortic valve regurgitation is not visualized. No evidence of aortic valve sclerosis or stenosis. 10. The pulmonic valve was not well visualized. Pulmonic valve regurgitation is mild. 11. Mildly elevated pulmonary artery systolic pressure. 12. The inferior vena cava is dilated in size with >50% respiratory variability, suggesting right atrial pressure of 8 mmHg.  Antimicrobials:  Anti-infectives (From admission, onward)   None      Subjective: Patient reports some shortness of breath with exertion; no chest pain, no palpitations, no nausea, no vomiting, no fever.  Overall with improved breathing status.  Still with signs of fluid overload on exam.  Objective: Vitals:   04/30/19 2103 05/01/19 0346 05/01/19 0546 05/01/19 0715  BP: (!) 125/58  (!) 105/54   Pulse: 69  65   Resp: 16  20   Temp: 98.3 F (36.8 C)  98 F (36.7 C)   TempSrc: Oral  Oral   SpO2: 99%  98% 99%  Weight:   92.6 kg    Height:        Intake/Output Summary (Last 24 hours) at 05/01/2019 1148 Last data filed at 05/01/2019 0900 Gross per 24 hour  Intake 474 ml  Output 1154 ml  Net -680 ml   Filed Weights   04/29/19 0500 04/30/19 0500 05/01/19 0346  Weight: 93.5 kg 93.3 kg 92.6 kg    Examination: General exam: Alert, awake, oriented x 3, in no acute distress.  Denies chest pain, palpitations, nausea and vomiting.  Reports shortness of breath on exertion, but overall significant improvement in her breathing.  Still with signs of fluid overload on exam. Respiratory system: Improved air movement bilaterally, no wheezing, no using accessory muscles.  Decreased breath sounds at the bases; no frank crackles today. Cardiovascular system:Rate controlled. No murmurs, rubs, gallops.  Unable to properly assess JVD due to body habitus. Gastrointestinal system: Abdomen is obese, nondistended, soft and nontender. No organomegaly or masses felt. Normal bowel sounds heard. Central nervous system: Alert and oriented. No focal neurological deficits. Extremities: No cyanosis or clubbing; 1+ edema bilaterally. Skin: No rashes, lesions or ulcers Psychiatry: Judgement and insight appear normal. Mood & affect appropriate.    Data Reviewed: I have personally reviewed following labs and imaging studies  CBC: Recent Labs  Lab 04/27/19 1716 04/28/19 0437 04/30/19 0502  WBC 10.9* 10.8* 8.8  HGB 11.3* 11.0* 11.1*  HCT 37.9 37.3 38.4  MCV 96.7 96.1 98.7  PLT 213 203 269   Basic Metabolic Panel: Recent Labs  Lab 04/27/19 1716 04/28/19 0437 04/29/19 0507 04/30/19 0502 05/01/19 0558  NA 135 142 144 144 145  K 3.8 3.4* 3.3* 3.6 3.4*  CL 91* 92* 93* 95* 94*  CO2 32 34* 36* 37* 42*  GLUCOSE 255* 223* 167* 115* 80  BUN 62* 61* 57* 56* 56*  CREATININE 1.22* 1.15* 1.31* 1.15* 1.16*  CALCIUM 8.2* 8.6* 9.0 8.6* 8.4*   GFR: Estimated Creatinine Clearance: 46.2 mL/min (A) (by C-G formula based on SCr of 1.16  mg/dL (H)).  Coagulation Profile: Recent Labs  Lab 04/27/19 1716 04/28/19 0437 04/29/19 0507 04/30/19 0502 05/01/19 0558  INR 2.7* 2.8* 2.2* 2.2* 2.2*   CBG: Recent Labs  Lab 04/30/19 0719 04/30/19 1117 04/30/19 1616 04/30/19 2100 05/01/19 0740  GLUCAP 105* 258* 173* 159* 99   Radiology Studies: No results found.  Scheduled Meds: . allopurinol  100 mg Oral Daily  . budesonide  0.5 mg Nebulization BID  . escitalopram  10 mg Oral QHS  . fluticasone  1 spray Each Nare BID  . furosemide  60 mg Intravenous Q12H  . insulin aspart  0-15 Units Subcutaneous TID WC  . insulin aspart  0-5 Units Subcutaneous QHS  . insulin glargine  45 Units Subcutaneous QHS  . loratadine  10 mg Oral Daily  . losartan  12.5 mg Oral Daily  . metolazone  2.5 mg Oral Daily  . metoprolol succinate  25 mg Oral BID  . montelukast  10 mg Oral QHS  . pantoprazole  40 mg Oral Daily  . potassium chloride  20 mEq Oral BID  . primidone  50 mg Oral QHS  . sodium chloride flush  3 mL Intravenous Q12H  . warfarin  4 mg Oral ONCE-1800  . Warfarin - Pharmacist Dosing Inpatient   Does not apply q1800   Continuous Infusions: . sodium chloride       LOS: 3 days    Time spent: 30 minutes.   Barton Dubois, MD [facility] Pager [phone]  05/01/2019, 11:48 AM

## 2019-05-01 NOTE — Progress Notes (Signed)
Patient self manages home CPAP.

## 2019-05-01 NOTE — Progress Notes (Signed)
ANTICOAGULATION CONSULT NOTE -   Pharmacy Consult for warfarin dosing Indication: atrial fibrillation  Drug interactions: n/a Concurrent anti-platelet drugs: n/a  Allergies  Allergen Reactions  . Xarelto [Rivaroxaban] Other (See Comments)    Internal bleeding  . Ancef [Cefazolin] Nausea And Vomiting  . Levaquin [Levofloxacin In D5w] Other (See Comments)    "afib"  . Lyrica [Pregabalin] Hives  . Tamiflu [Oseltamivir Phosphate] Other (See Comments)    "water blisters"  . Zoloft [Sertraline Hcl] Other (See Comments)    Jaw problems, jittery  . Augmentin [Amoxicillin-Pot Clavulanate] Itching  . Ciprofloxacin Itching and Nausea And Vomiting  . Haldol [Haloperidol] Other (See Comments)    Restless leg  . Nsaids Diarrhea  . Penicillins Itching, Nausea And Vomiting and Rash    Has patient had a PCN reaction causing immediate rash, facial/tongue/throat swelling, SOB or lightheadedness with hypotension: Yes Has patient had a PCN reaction causing severe rash involving mucus membranes or skin necrosis: No Has patient had a PCN reaction that required hospitalization No Has patient had a PCN reaction occurring within the last 10 years: Yes If all of the above answers are "NO", then may proceed with Cephalosporin use.  . Topamax [Topiramate] Nausea Only    Patient Measurements: Height: 4\' 11"  (149.9 cm) Weight: 204 lb 2.3 oz (92.6 kg) IBW/kg (Calculated) : 43.2  HEPARIN DW (KG): 66.2  Vital Signs: Temp: 98 F (36.7 C) (11/28 0546) Temp Source: Oral (11/28 0546) BP: 105/54 (11/28 0546) Pulse Rate: 65 (11/28 0546)  Labs: Recent Labs    04/29/19 0507 04/30/19 0502 05/01/19 0558  HGB  --  11.1*  --   HCT  --  38.4  --   PLT  --  186  --   LABPROT 24.1* 24.4* 24.1*  INR 2.2* 2.2* 2.2*  CREATININE 1.31* 1.15* 1.16*    Estimated Creatinine Clearance: 46.2 mL/min (A) (by C-G formula based on SCr of 1.16 mg/dL (H)).   Assessment: Pharmacy consulted to dose warfarin for this 68  yo female  on chronic warfarin therapy with atrial fibrillation.  She had internal bleeding in the past with Xarelto.  CBC is within normal limits and stable.  INR remains therapeutic at 2.2  Home dose:   4mg  daily except for 2mg  on Thursdays/Sundays   Goal of Therapy:  INR 2-3 Monitor platelets by anticoagulation protocol: Yes   Plan:  Coumadin 4mg  po x 1 today Daily PT/INR and every other day CBC Monitor for signs and symptoms of bleeding   Thomasenia Sales, PharmD, MBA, BCGP Clinical Pharmacist  05/01/2019 7:45 AM

## 2019-05-01 NOTE — Plan of Care (Signed)

## 2019-05-02 DIAGNOSIS — K219 Gastro-esophageal reflux disease without esophagitis: Secondary | ICD-10-CM

## 2019-05-02 LAB — BASIC METABOLIC PANEL
Anion gap: 13 (ref 5–15)
BUN: 54 mg/dL — ABNORMAL HIGH (ref 8–23)
CO2: 42 mmol/L — ABNORMAL HIGH (ref 22–32)
Calcium: 8.5 mg/dL — ABNORMAL LOW (ref 8.9–10.3)
Chloride: 90 mmol/L — ABNORMAL LOW (ref 98–111)
Creatinine, Ser: 1.09 mg/dL — ABNORMAL HIGH (ref 0.44–1.00)
GFR calc Af Amer: >60 mL/min (ref >60)
GFR calc non Af Amer: 52 mL/min — ABNORMAL LOW (ref >60)
Glucose, Bld: 103 mg/dL — ABNORMAL HIGH (ref 70–99)
Potassium: 4.1 mmol/L (ref 3.5–5.1)
Sodium: 145 mmol/L (ref 135–145)

## 2019-05-02 LAB — PROTIME-INR
INR: 2.1 — ABNORMAL HIGH (ref 0.8–1.2)
Prothrombin Time: 23.7 seconds — ABNORMAL HIGH (ref 11.4–15.2)

## 2019-05-02 LAB — GLUCOSE, CAPILLARY
Glucose-Capillary: 85 mg/dL (ref 70–99)
Glucose-Capillary: 193 mg/dL — ABNORMAL HIGH (ref 70–99)

## 2019-05-02 MED ORDER — POTASSIUM CHLORIDE ER 20 MEQ PO TBCR: 20.0000 meq | EXTENDED_RELEASE_TABLET | Freq: Two times a day (BID) | ORAL | 1 refills | Status: DC

## 2019-05-02 MED ORDER — METOLAZONE 2.5 MG PO TABS: 2.5000 mg | ORAL_TABLET | Freq: Every day | ORAL | 1 refills | Status: DC

## 2019-05-02 MED ORDER — LOSARTAN POTASSIUM 25 MG PO TABS: 12.5000 mg | ORAL_TABLET | Freq: Every day | ORAL | 1 refills | Status: DC

## 2019-05-02 MED ORDER — FUROSEMIDE 40 MG PO TABS: 80.0000 mg | ORAL_TABLET | Freq: Three times a day (TID) | ORAL | 1 refills | Status: DC

## 2019-05-02 MED ORDER — WARFARIN SODIUM 2 MG PO TABS: 4.0000 mg | ORAL_TABLET | Freq: Once | ORAL | Status: DC

## 2019-05-02 NOTE — Progress Notes (Signed)
IV removed, 2x2 gauze and paper tape applied to site, patient tolerated well. Reviewed AVS with patient and patient's husband, both verbalized understanding.  Patient taken to lobby via wheelchair and transported home by her husband.

## 2019-05-02 NOTE — Progress Notes (Signed)
ANTICOAGULATION CONSULT NOTE -   Pharmacy Consult for warfarin dosing Indication: atrial fibrillation  Drug interactions: n/a Concurrent anti-platelet drugs: n/a  Allergies  Allergen Reactions  . Xarelto [Rivaroxaban] Other (See Comments)    Internal bleeding  . Ancef [Cefazolin] Nausea And Vomiting  . Levaquin [Levofloxacin In D5w] Other (See Comments)    "afib"  . Lyrica [Pregabalin] Hives  . Tamiflu [Oseltamivir Phosphate] Other (See Comments)    "water blisters"  . Zoloft [Sertraline Hcl] Other (See Comments)    Jaw problems, jittery  . Augmentin [Amoxicillin-Pot Clavulanate] Itching  . Ciprofloxacin Itching and Nausea And Vomiting  . Haldol [Haloperidol] Other (See Comments)    Restless leg  . Nsaids Diarrhea  . Penicillins Itching, Nausea And Vomiting and Rash    Has patient had a PCN reaction causing immediate rash, facial/tongue/throat swelling, SOB or lightheadedness with hypotension: Yes Has patient had a PCN reaction causing severe rash involving mucus membranes or skin necrosis: No Has patient had a PCN reaction that required hospitalization No Has patient had a PCN reaction occurring within the last 10 years: Yes If all of the above answers are "NO", then may proceed with Cephalosporin use.  . Topamax [Topiramate] Nausea Only    Patient Measurements: Height: 4\' 11"  (149.9 cm) Weight: 202 lb 9.6 oz (91.9 kg) IBW/kg (Calculated) : 43.2  HEPARIN DW (KG): 66.2  Vital Signs: Temp: 98 F (36.7 C) (11/29 0501) Temp Source: Oral (11/28 2147) BP: 115/75 (11/29 0501) Pulse Rate: 72 (11/29 0501)  Labs: Recent Labs    04/30/19 0502 05/01/19 0558 05/02/19 0636  HGB 11.1*  --   --   HCT 38.4  --   --   PLT 186  --   --   LABPROT 24.4* 24.1* 23.7*  INR 2.2* 2.2* 2.1*  CREATININE 1.15* 1.16*  --     Estimated Creatinine Clearance: 45.9 mL/min (A) (by C-G formula based on SCr of 1.16 mg/dL (H)).   Assessment: Pharmacy consulted to dose warfarin for this 68  yo female  on chronic warfarin therapy with atrial fibrillation.  She had internal bleeding in the past with Xarelto.  CBC is within normal limits and stable.  INR remains therapeutic at 2.1  Home dose:   4mg  daily except for 2mg  on Thursdays/Sundays   Goal of Therapy:  INR 2-3 Monitor platelets by anticoagulation protocol: Yes   Plan:  Coumadin 4mg  po x 1 today Daily PT/INR and every other day CBC Monitor for signs and symptoms of bleeding   Thomasenia Sales, PharmD, MBA, BCGP Clinical Pharmacist  05/02/2019 7:30 AM

## 2019-05-02 NOTE — Discharge Summary (Signed)
Physician Discharge Summary  Denise Freeman VZD:[id number] DOB: [birth year] DOA: 04/27/2019  PCP: Celene Squibb, MD  Admit date: 04/27/2019 Discharge date: 05/02/2019  Time spent: 35 minutes  Recommendations for Outpatient Follow-up:  1. Patient remains to be a full code 2. Repeat basic metabolic panel to follow electrolytes and renal function 3. Close follow-up the patient CBGs with further adjustment to hypoglycemic regimen as required   Discharge Diagnoses:  Principal Problem:   Acute on chronic combined systolic and diastolic CHF (congestive heart failure) (HCC) Active Problems:   Unspecified atrial fibrillation (HCC)   Chronic hypoxic, hypercapnic respiratory failure   Other emphysema (HCC)   Obesity, Class III, BMI 40-49.9 (morbid obesity) ([place])   Mood disorder in conditions classified elsewhere   Chronic pain syndrome   Uncontrolled type 2 diabetes mellitus with hyperglycemia (HCC)   Prolonged QT interval   Gastroesophageal reflux disease   Discharge Condition: Stable and improved.  Patient discharged home with home health RN.  Patient will follow-up with PCP in 10 days, follow-up with cardiology service as instructed.  Diet recommendation: Low-sodium modified carbohydrate diet.  Filed Weights   04/30/19 0500 05/01/19 0346 05/02/19 0500  Weight: 93.3 kg 92.6 kg 91.9 kg    History of present illness:  As per H&P written by Dr. Myna Hidalgo on 04/27/2019 68 y.o.femalewith medical history significant forchronic combined systolic and diastolic CHF, atrial fibrillation on warfarin, COPD with chronic hypoxic respiratory failure, depression with anxiety, insulin-dependent diabetes mellitus, and chronic kidney disease stage III, now presenting to the emergency department for evaluation of progressive shortness of breath, leg swelling, and weight gain. Patient reports that her diuretics were adjusted when she was discharged from the hospital 3 months ago, she does not feel like her  respiratory status ever returned to her previous baseline since that admission, reports multiple adjustments to her medications in the interim, but reports progressive dyspnea, weight gain, and swelling, particularly over the past few days. She denies any chest pain, fevers, chills, wheezing, or any significant coughing.  ED Course:Upon arrival to the ED, patient is found to be afebrile, saturating mid 90s on her usual 3 L/min of supplemental oxygen, and with stable blood pressure. EKG features atrial fibrillation with PVCs and QTc interval of 523 ms. Chest x-ray is notable for mild cardiomegaly without overt edema. Chemistry panel features a BUN of 62 and creatinine 1.22, similar to priors. CBC is notable for mild leukocytosis. INR is therapeutic. BNP is elevated to 318. High-sensitivity troponin is normal x2. Patient was given 80 mg IV Lasix in the ED.   Hospital Course:  1-acute on chronic combined systolic and diastolic CHF (congestive heart failure) ([place]) -Patient reports some diet indiscretion and lack of response to outpatient Demadex. -No chest pain, no palpitations, no nausea or vomiting. -Reports significant improvement in her swelling, breathing and denies orthopnea at discharge. -Continue IV diuresis and metolazone -Overall 7.5 L negative balance since admission; approximately 8.5 pounds off. -Continue daily weights and low-sodium diet. -Cardiology service has been consulted and appreciate recommendations -Patient will be discharged home on 80 mg of Lasix 3 times a day along with daily metolazone.  2-Unspecified atrial fibrillation (HCC) -Rate control and is stable -Continue Coumadin for secondary prevention -Continue beta-blocker.  3-acute on chronic hypoxic, hypercapnic respiratory failure -continue CPAP QHS -continue oxygen supplementation -continue home O2 supplementation  4-Other emphysema (HCC) -Currently no wheezing -Continue home nebulizer regimen and  Flonase  5-Mood disorder in conditions classified elsewhere -No hallucinations or suicidal ideation currently -  Continue home antidepressant therapy  6-Uncontrolled type 2 diabetes mellitus with hyperglycemia ([place]) -resume home hypoglycemic regimen  -Modified carbohydrate diet has been discussed with patient.  7-Prolonged QT interval -Chronic and stable -minimize the use of medications that can prolong QT. -Follow electrolytes closely and further replete as needed.  8-chronic kidney disease a stage IIIb -Appears to be secondary to diabetes and HTN. -Continue to remains stable and at baseline -Close follow up with repeat BMET recommended to assess renal function stability.  Procedures:  Low for x-ray reports  2D echo:  1. Left ventricular ejection fraction, by visual estimation, is 50 to 55%. The left ventricle has low normal function. There is mildly increased left ventricular hypertrophy. 2. Elevated left atrial pressure. 3. Left ventricular diastolic parameters are consistent with Grade II diastolic dysfunction (pseudonormalization). 4. Global right ventricle has mildly reduced systolic function.The right ventricular size is moderately enlarged. No increase in right ventricular wall thickness. 5. Left atrial size was moderately dilated. 6. Right atrial size was moderately dilated. 7. The mitral valve is normal in structure. No evidence of mitral valve regurgitation. No evidence of mitral stenosis. 8. The tricuspid valve is normal in structure. Tricuspid valve regurgitation is trivial. 9. The aortic valve has an indeterminant number of cusps. Aortic valve regurgitation is not visualized. No evidence of aortic valve sclerosis or stenosis. 10. The pulmonic valve was not well visualized. Pulmonic valve regurgitation is mild. 11. Mildly elevated pulmonary artery systolic pressure. 12. The inferior vena cava is dilated in size with >50% respiratory variability,  suggesting right atrial pressure of 8 mmHg.  Consultations:  None  Discharge Exam: Vitals:   05/02/19 0755 05/02/19 1331  BP:  121/69  Pulse:  69  Resp:  (!) 22  Temp:  97.9 F (36.6 C)  SpO2: 97% 100%    General: Alert, awake and oriented x3; in no acute distress.  Patient is afebrile.  Denies chest pain, no palpitations, no nausea, no vomiting.  Breathing is a stable and at this moment ready to go home.  Cardiovascular: Rate control.  No murmurs, no rubs, no gallops.  Unable to properly assess for JVD due to body habitus. Respiratory: Improved air movement bilaterally, no wheezing, no wheezing accessory muscles.  No frank crackles; normal respiratory effort. Abdomen: Obese, nondistended, soft and nontender.  Positive bowel sounds. Extremities: No cyanosis, no clubbing. Trace edema bilaterally appreciated.  Discharge Instructions   Discharge Instructions    (HEART FAILURE PATIENTS) Call MD:  Anytime you have any of the following symptoms: 1) 3 pound weight gain in 24 hours or 5 pounds in 1 week 2) shortness of breath, with or without a dry hacking cough 3) swelling in the hands, feet or stomach 4) if you have to sleep on extra pillows at night in order to breathe.   Complete by: As directed    Diet - low sodium heart healthy   Complete by: As directed    Discharge instructions   Complete by: As directed    Take medications as prescribed Low-sodium diet, aiming for less than 2 g of sodium daily) Check your weight on daily basis Follow-up with PCP in 10 days Follow-up with cardiology service as instructed. Maintain adequate hydration.   Increase activity slowly   Complete by: As directed      Allergies as of 05/02/2019      Reactions   Xarelto [rivaroxaban] Other (See Comments)   Internal bleeding   Ancef [cefazolin] Nausea And Vomiting   Levaquin [levofloxacin  In D5w] Other (See Comments)   "afib"   Lyrica [pregabalin] Hives   Tamiflu [oseltamivir Phosphate] Other  (See Comments)   "water blisters"   Zoloft [sertraline Hcl] Other (See Comments)   Jaw problems, jittery   Augmentin [amoxicillin-pot Clavulanate] Itching   Ciprofloxacin Itching, Nausea And Vomiting   Haldol [haloperidol] Other (See Comments)   Restless leg   Nsaids Diarrhea   Penicillins Itching, Nausea And Vomiting, Rash   Has patient had a PCN reaction causing immediate rash, facial/tongue/throat swelling, SOB or lightheadedness with hypotension: Yes Has patient had a PCN reaction causing severe rash involving mucus membranes or skin necrosis: No Has patient had a PCN reaction that required hospitalization No Has patient had a PCN reaction occurring within the last 10 years: Yes If all of the above answers are "NO", then may proceed with Cephalosporin use.   Topamax [topiramate] Nausea Only      Medication List    STOP taking these medications   predniSONE 5 MG tablet Commonly known as: DELTASONE     TAKE these medications   albuterol (2.5 MG/3ML) 0.083% nebulizer solution Commonly known as: PROVENTIL USE (1) IN NEBULIZER EVERY 6 HOURS AS NEEDED FOR SHORTNESS OF BREATH.   allopurinol 100 MG tablet Commonly known as: ZYLOPRIM TAKE 1 TABLET BY MOUTH ONCE DAILY.   Basaglar KwikPen 100 UNIT/ML Sopn Inject 0.6 mLs (60 Units total) into the skin at bedtime.   budesonide 0.5 MG/2ML nebulizer solution Commonly known as: PULMICORT Take 2 mLs (0.5 mg total) by nebulization 2 (two) times daily.   cetirizine 10 MG tablet Commonly known as: ZYRTEC Take 1 tablet (10 mg total) by mouth at bedtime.   cholecalciferol 10 MCG (400 UNIT) Tabs tablet Commonly known as: VITAMIN D3 Take 1 tablet (400 Units total) by mouth 2 (two) times daily.   clobetasol cream 0.05 % Commonly known as: TEMOVATE   colchicine 0.6 MG tablet Take 1 tablet (0.6 mg total) by mouth as needed.   escitalopram 10 MG tablet Commonly known as: LEXAPRO Take 1 tablet (10 mg total) by mouth at bedtime.    FeroSul 325 (65 FE) MG tablet Generic drug: ferrous sulfate Take 1 tablet by mouth daily.   fluticasone 50 MCG/ACT nasal spray Commonly known as: FLONASE Place 1 spray into both nostrils 2 (two) times daily.   furosemide 40 MG tablet Commonly known as: LASIX Take 2 tablets (80 mg total) by mouth 3 (three) times daily. What changed: when to take this   insulin lispro 100 UNIT/ML KiwkPen Commonly known as: HumaLOG KwikPen Inject 0.1-0.16 mLs (10-16 Units total) into the skin 3 (three) times daily before meals.   Insulin Pen Needle 32G X 4 MM Misc Use to inject insulin 5 times daily   ipratropium 0.02 % nebulizer solution Commonly known as: ATROVENT USE (1) IN NEBULIZER EVERY 6 HOURS AS NEEDED FOR SHORTNESS OF BREATH.   losartan 25 MG tablet Commonly known as: COZAAR Take 0.5 tablets (12.5 mg total) by mouth daily. Start taking on: May 03, 2019 What changed: how much to take   metolazone 2.5 MG tablet Commonly known as: ZAROXOLYN Take 1 tablet (2.5 mg total) by mouth daily. Start taking on: May 03, 2019 What changed:   medication strength  how much to take  how to take this  when to take this  additional instructions   metoprolol succinate 25 MG 24 hr tablet Commonly known as: TOPROL-XL TAKE 1 TABLET BY MOUTH TWICE DAILY   mirtazapine 15 MG  tablet Commonly known as: REMERON Take 1 tablet (15 mg total) by mouth at bedtime.   modafinil 200 MG tablet Commonly known as: PROVIGIL Take 1 tablet (200 mg total) by mouth daily.   mometasone 50 MCG/ACT nasal spray Commonly known as: NASONEX Place 1 spray into the nose daily.   montelukast 10 MG tablet Commonly known as: SINGULAIR TAKE ONE TABLET BY MOUTH AT BEDTIME.   mupirocin cream 2 % Commonly known as: Bactroban Apply 1 application topically 2 (two) times daily.   nitroGLYCERIN 0.4 MG SL tablet Commonly known as: NITROSTAT Place 0.4 mg under the tongue every 5 (five) minutes as needed for  chest pain.   nystatin powder Commonly known as: nystatin Apply topically 2 (two) times daily. Apply under breasts   ondansetron 4 MG tablet Commonly known as: ZOFRAN Take 4 mg by mouth once.   oxyCODONE-acetaminophen 5-325 MG tablet Commonly known as: PERCOCET/ROXICET Take 1 tablet by mouth 2 (two) times daily as needed for severe pain.   pantoprazole 20 MG tablet Commonly known as: PROTONIX TAKE (1) TABLET BY MOUTH TWICE A DAY BEFORE MEALS. (BREAKFAST AND SUPPER)   Potassium Chloride ER 20 MEQ Tbcr Take 20 mEq by mouth 2 (two) times daily. What changed:   medication strength  how much to take  when to take this   primidone 50 MG tablet Commonly known as: MYSOLINE TAKE ONE TABLET BY MOUTH AT BEDTIME.   PROBIOTIC PO Take 1 tablet by mouth at bedtime.   TART CHERRY ADVANCED PO Take by mouth daily.   triamcinolone lotion 0.1 % Commonly known as: KENALOG Apply 1 application topically 3 (three) times daily. For up to 14 days and as needed   True Metrix Blood Glucose Test test strip Generic drug: glucose blood USE TO TEST BLOOD SUGAR AS DIRECTED.   Vitamin A 2400 MCG (8000 UT) Caps Take 8,000 Units by mouth every morning.   warfarin 4 MG tablet Commonly known as: COUMADIN Take as directed. If you are unsure how to take this medication, talk to your nurse or doctor. Original instructions: TAKE 1 TABLET BY MOUTH ONCE DAILY EXCEPT ON THURSDAY TAKE 1/2 TABLET      Allergies  Allergen Reactions  . Xarelto [Rivaroxaban] Other (See Comments)    Internal bleeding  . Ancef [Cefazolin] Nausea And Vomiting  . Levaquin [Levofloxacin In D5w] Other (See Comments)    "afib"  . Lyrica [Pregabalin] Hives  . Tamiflu [Oseltamivir Phosphate] Other (See Comments)    "water blisters"  . Zoloft [Sertraline Hcl] Other (See Comments)    Jaw problems, jittery  . Augmentin [Amoxicillin-Pot Clavulanate] Itching  . Ciprofloxacin Itching and Nausea And Vomiting  . Haldol  [Haloperidol] Other (See Comments)    Restless leg  . Nsaids Diarrhea  . Penicillins Itching, Nausea And Vomiting and Rash    Has patient had a PCN reaction causing immediate rash, facial/tongue/throat swelling, SOB or lightheadedness with hypotension: Yes Has patient had a PCN reaction causing severe rash involving mucus membranes or skin necrosis: No Has patient had a PCN reaction that required hospitalization No Has patient had a PCN reaction occurring within the last 10 years: Yes If all of the above answers are "NO", then may proceed with Cephalosporin use.  . Topamax [Topiramate] Nausea Only   Follow-up Information    Celene Squibb, MD. Schedule an appointment as soon as possible for a visit in 10 day(s).   Specialty: Internal Medicine Contact information: [place]        Pixie Casino, MD .   Specialty: Cardiology Contact information: [address] [place] [address] [phone]           The results of significant diagnostics from this hospitalization (including imaging, microbiology, ancillary and laboratory) are listed below for reference.    Significant Diagnostic Studies: Dg Chest 2 View  Result Date: 04/27/2019 CLINICAL DATA:  Shortness of breath and weight gain EXAM: CHEST - 2 VIEW COMPARISON:  01/22/2019 FINDINGS: Mild cardiomegaly. No overt pulmonary edema. No pneumothorax or pleural effusion. No focal airspace consolidation. IMPRESSION: Mild cardiomegaly without overt pulmonary edema. Electronically Signed   By: Ulyses Jarred M.D.   On: 04/27/2019 19:39    Microbiology: Recent Results (from the past 240 hour(s))  SARS CORONAVIRUS 2 (TAT 6-24 HRS) Nasopharyngeal Nasopharyngeal Swab     Status: None   Collection Time: 04/27/19  8:46 PM   Specimen: Nasopharyngeal Swab  Result Value Ref Range Status   SARS Coronavirus 2 NEGATIVE NEGATIVE Final    Comment: (NOTE) SARS-CoV-2 target nucleic  acids are NOT DETECTED. The SARS-CoV-2 RNA is generally detectable in upper and lower respiratory specimens during the acute phase of infection. Negative results do not preclude SARS-CoV-2 infection, do not rule out co-infections with other pathogens, and should not be used as the sole basis for treatment or other patient management decisions. Negative results must be combined with clinical observations, patient history, and epidemiological information. The expected result is Negative. Fact Sheet for Patients: SugarRoll.be Fact Sheet for Healthcare Providers: https://www.woods-mathews.com/ This test is not yet approved or cleared by the [place] FDA and  has been authorized for detection and/or diagnosis of SARS-CoV-2 by FDA under an Emergency Use Authorization (EUA). This EUA will remain  in effect (meaning this test can be used) for the duration of the COVID-19 declaration under Section 56 4(b)(1) of the Act, 21 U.S.C. section 360bbb-3(b)(1), unless the authorization is terminated or revoked sooner. Performed at [facility], [place] [address] [place], [place] [address]      Labs: Basic Metabolic Panel: Recent Labs  Lab 04/28/19 0437 04/29/19 0507 04/30/19 0502 05/01/19 0558 05/02/19 0636  NA 142 144 144 145 145  K 3.4* 3.3* 3.6 3.4* 4.1  CL 92* 93* 95* 94* 90*  CO2 34* 36* 37* 42* 42*  GLUCOSE 223* 167* 115* 80 103*  BUN 61* 57* 56* 56* 54*  CREATININE 1.15* 1.31* 1.15* 1.16* 1.09*  CALCIUM 8.6* 9.0 8.6* 8.4* 8.5*   CBC: Recent Labs  Lab 04/27/19 1716 04/28/19 0437 04/30/19 0502  WBC 10.9* 10.8* 8.8  HGB 11.3* 11.0* 11.1*  HCT 37.9 37.3 38.4  MCV 96.7 96.1 98.7  PLT 213 203 186    BNP (last 3 results) Recent Labs    01/22/19 1824 04/27/19 1716  BNP 190.0* 318.0*    CBG: Recent Labs  Lab 05/01/19 1149 05/01/19 1605 05/01/19 2147 05/02/19 0724 05/02/19 1054  GLUCAP 114* 179* 153* 85 193*     Signed:  Barton Dubois MD.  [facility] 05/02/2019, 2:34 PM

## 2019-05-04 ENCOUNTER — Other Ambulatory Visit: Payer: Self-pay | Admitting: *Deleted

## 2019-05-04 NOTE — Patient Outreach (Signed)
[facility]) Care Management  05/04/2019  Denise Freeman [employer] [facility] [employer] May 18, 1951 [id number]   Subjective: Telephone call to patient's home  number, no answer, left HIPAA compliant voicemail message, and requested call back.    Objective: Per KPN (Knowledge Performance Now, point of care tool) and chart review, patient hospitalized 04/27/2019 - 05/02/2019 for Acute on chronic combined systolic and diastolic CHF (congestive heart failure).  Patient hospitalized 01/22/2019 - 01/25/2019 for Atrial fibrillation, cute on chronic combined systolic and diastolic CHF (congestive heart failure).    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  Patient closed to [place] Management services on 02/10/2019 due to unable to contact.     Assessment: Received [facility] referral on 05/03/2019.  Referral source: Sharley [facility].   Referral reason:  Reason for consult -> disease and medication managment      Diagnoses of -> Heart Failure   Transition of care follow up pending patient contact.       Plan: RNCM will send unsuccessful outreach letter, [facility] pamphlet, will call patient for 2nd telephone outreach attempt within 4 business days, transition of care follow up, and proceed with case closure, within 10 business days if no return call.       Denise Freeman H. Annia Friendly, BSN, [place] Management [facility] Telephonic CM Phone: [phone] Fax: [phone]

## 2019-05-07 ENCOUNTER — Other Ambulatory Visit: Payer: Self-pay | Admitting: *Deleted

## 2019-05-07 ENCOUNTER — Ambulatory Visit: Payer: Medicare Other | Admitting: Internal Medicine

## 2019-05-07 DIAGNOSIS — N189 Chronic kidney disease, unspecified: Secondary | ICD-10-CM | POA: Diagnosis not present

## 2019-05-07 DIAGNOSIS — J9621 Acute and chronic respiratory failure with hypoxia: Secondary | ICD-10-CM | POA: Diagnosis not present

## 2019-05-07 DIAGNOSIS — J9602 Acute respiratory failure with hypercapnia: Secondary | ICD-10-CM | POA: Diagnosis not present

## 2019-05-07 DIAGNOSIS — I4891 Unspecified atrial fibrillation: Secondary | ICD-10-CM | POA: Diagnosis not present

## 2019-05-07 DIAGNOSIS — I5043 Acute on chronic combined systolic (congestive) and diastolic (congestive) heart failure: Secondary | ICD-10-CM | POA: Diagnosis not present

## 2019-05-07 NOTE — Patient Outreach (Signed)
[facility]) Care Management  05/07/2019  Denise Freeman [facility] June 08, 1950 [id number]   Subjective: Telephone call to patient's home  number, no answer, left HIPAA compliant voicemail message, and requested call back.    Objective: Per KPN (Knowledge Performance Now, point of care tool) and chart review, patient hospitalized 04/27/2019 - 05/02/2019 for Acute on chronic combined systolic and diastolic CHF (congestive heart failure).  Patient hospitalized 01/22/2019 - 01/25/2019 for Atrial fibrillation, cute on chronic combined systolic and diastolic CHF (congestive heart failure).    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  Patient closed to [place] Management services on 02/10/2019 due to unable to contact.     Assessment: Received [facility] referral on 05/03/2019.  Referral source: Denise Freeman.   Referral reason:  Reason for consult -> disease and medication managment      Diagnoses of -> Heart Failure   Transition of care follow up pending patient contact.       Plan: RNCM has sent unsuccessful outreach letter, [facility] pamphlet, will call patient for 3rd telephone outreach attempt within 4 business days, transition of care follow up, and proceed with case closure, within 10 business days if no return call.      Denise Freeman H. Annia Friendly, BSN, [place] Management [facility] Telephonic CM Phone: [phone] Fax: [phone]

## 2019-05-08 DIAGNOSIS — N183 Chronic kidney disease, stage 3 unspecified: Secondary | ICD-10-CM | POA: Diagnosis not present

## 2019-05-08 DIAGNOSIS — M1030 Gout due to renal impairment, unspecified site: Secondary | ICD-10-CM | POA: Diagnosis not present

## 2019-05-08 DIAGNOSIS — I130 Hypertensive heart and chronic kidney disease with heart failure and stage 1 through stage 4 chronic kidney disease, or unspecified chronic kidney disease: Secondary | ICD-10-CM | POA: Diagnosis not present

## 2019-05-08 DIAGNOSIS — I5043 Acute on chronic combined systolic (congestive) and diastolic (congestive) heart failure: Secondary | ICD-10-CM | POA: Diagnosis not present

## 2019-05-08 DIAGNOSIS — D631 Anemia in chronic kidney disease: Secondary | ICD-10-CM | POA: Diagnosis not present

## 2019-05-08 DIAGNOSIS — E1122 Type 2 diabetes mellitus with diabetic chronic kidney disease: Secondary | ICD-10-CM | POA: Diagnosis not present

## 2019-05-10 ENCOUNTER — Other Ambulatory Visit: Payer: Self-pay | Admitting: Pulmonary Disease

## 2019-05-10 ENCOUNTER — Encounter: Payer: Self-pay | Admitting: *Deleted

## 2019-05-10 ENCOUNTER — Other Ambulatory Visit: Payer: Self-pay | Admitting: *Deleted

## 2019-05-10 NOTE — Patient Outreach (Addendum)
[place] [facility]) Care Management  05/10/2019  Denise Freeman [facility] August 08, 1950 [id number]   Subjective: Telephone call to patient's home / mobile number, spoke with patient, and HIPAA verified.  Discussed [facility] referral follow up, patient voiced understanding, and is in agreement to follow up.  Patient states she is doing well, managing increased shortness of breath with activities, with increased strengthening, and increased rest breaks, wears oxygen continuous at 3 liters.    States she has home health nurse through [place] and services are going well.   States home health RN monitors respiratory status and takes measurements to monitor fluid retention at each home health visit.  States she had a phone visit with primary MD on 05/05/2019, has a follow up visit with cardiologist on 05/18/2019, and with the retina specialist MD at the end of the month (unsure of the exact date).  Patient states she has an appointment with the Coumadin clinic this afternoon. Patient states she is aware of signs/ symptoms to report, how to reach provider if needed after hours, when to go to ED, and / or call 911.   Patient states she is able to manage some self care and has assistance as needed.  States she lives with her husband, who also has multiple health issue, and they assist each other as much as possible.   States they have very limited financial resources, she has visual impairment,  legally blind, has a 7th grade education, she was very sick as a child, unable to complete school, and is uses a magnifying glass to read.  States her husband is unable to read and write, she does all of the reading for their household.   States she need assistance with caregiver and housekeeping services, visual aid / assistive devices,  in agreement to referral to [place] Worker for caregiver resources, housekeeping resources, visual aid/ support services resources.      Discussed Advanced Directives, advised of [place] Management Social Worker  Advanced Directives document completion benefit, patient voices understanding, and in agreement to a referral to Education officer, museum will to ask Social Worker to send [employer], and follow up on document completion.    Patient voices understanding of medical diagnosis and treatment plan.   Patient she is familiar with [place] Management services and is in agreement to this RNCM following her for transition of care follow up,  congestive heart failure disease monitoring, and education.   States she is accessing her Medicare benefits as needed via member services number on back of card.  Patient states she does not have any education material, transportation, or pharmacy needs at this time.  States she is very appreciative of the follow up and is in agreement to receive [place] Management services.      Objective:Per KPN (Knowledge Performance Now, point of care tool) and chart review,patient hospitalized 04/27/2019 - 05/02/2019 forAcute on chronic combined systolic and diastolic CHF (congestive heart failure). Patient hospitalized 01/22/2019 - 01/25/2019 for Atrial fibrillation,cute on chronic combined systolic and diastolic CHF (congestive heart failure). 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 Patient closed to [place] Management services on 02/10/2019 due to unable to contact.     Assessment: Received [facility]  referral on 05/03/2019. Referral  source: Sharley Hunnicutt. Referral reason:  Reason for consult ->disease and medication managment      Diagnoses of -> Heart Failure   Transition of care initial follow up completed.   Will follow up for weekly transition of care follow up calls,  follow up for congestive heart failure disease monitoring and education.  Will refer patient to [place] Management Social Worker for caregiver resources, housekeeping resources, visual aid/ support services resources, Advanced Directive packet, and follow up on document completion.  Outpatient Encounter Medications as of 05/10/2019  Medication Sig Note  . albuterol (PROVENTIL) (2.5 MG/3ML) 0.083% nebulizer solution USE (1) IN NEBULIZER EVERY 6 HOURS AS NEEDED FOR SHORTNESS OF BREATH.   Marland Kitchen allopurinol (ZYLOPRIM) 100 MG tablet TAKE 1 TABLET BY MOUTH ONCE DAILY.   . budesonide (PULMICORT) 0.5 MG/2ML nebulizer solution Take 2 mLs (0.5 mg total) by nebulization 2 (two) times daily.   . cetirizine (ZYRTEC) 10 MG tablet Take 1 tablet (10 mg total) by mouth at bedtime.   . cholecalciferol (VITAMIN D) 400 units TABS tablet Take 1 tablet (400 Units total) by mouth 2 (two) times daily.   . clobetasol cream (TEMOVATE) 0.05 %    . colchicine 0.6 MG tablet Take 1 tablet (0.6 mg total) by mouth as needed.   Marland Kitchen escitalopram (LEXAPRO) 10 MG tablet Take 1 tablet (10 mg total) by mouth at bedtime.   . FEROSUL 325 (65 Fe) MG tablet Take 1 tablet by mouth daily.   . fluticasone (FLONASE) 50 MCG/ACT nasal spray Place 1 spray into both nostrils 2 (two) times daily.   . furosemide (LASIX) 40 MG tablet Take 2 tablets (80 mg total) by mouth 3 (three) times daily.   . Insulin Glargine (BASAGLAR KWIKPEN) 100 UNIT/ML SOPN Inject 0.6 mLs (60 Units total) into the skin at bedtime.   . insulin lispro (HUMALOG KWIKPEN) 100 UNIT/ML KiwkPen Inject 0.1-0.16 mLs (10-16 Units total) into the skin 3 (three) times daily before meals.   . Insulin Pen Needle 32G X 4 MM MISC Use to  inject insulin 5 times daily   . ipratropium (ATROVENT) 0.02 % nebulizer solution USE (1) IN NEBULIZER EVERY 6 HOURS AS NEEDED FOR SHORTNESS OF BREATH.   Marland Kitchen losartan (COZAAR) 25 MG tablet Take 0.5 tablets (12.5 mg total) by mouth daily.   . metolazone (ZAROXOLYN) 2.5 MG tablet Take 1 tablet (2.5 mg total) by mouth daily.   . metoprolol succinate (TOPROL-XL) 25 MG 24 hr tablet TAKE 1 TABLET BY MOUTH TWICE DAILY   . mirtazapine (REMERON) 15 MG tablet Take 1 tablet (15 mg total) by mouth at bedtime.   . Misc Natural Products (TART CHERRY ADVANCED PO) Take by mouth daily.   . modafinil (PROVIGIL) 200 MG tablet Take 1 tablet (200 mg total) by mouth daily.   . mometasone (NASONEX) 50 MCG/ACT nasal spray Place 1 spray into the nose daily.   . montelukast (SINGULAIR) 10 MG tablet TAKE ONE TABLET BY MOUTH AT BEDTIME.   . mupirocin cream (BACTROBAN) 2 % Apply 1 application topically 2 (two) times daily.   Marland Kitchen nystatin (NYSTATIN) powder Apply topically 2 (two) times daily. Apply under breasts   . ondansetron (ZOFRAN) 4 MG tablet Take 4 mg by mouth once.   Marland Kitchen oxyCODONE-acetaminophen (PERCOCET/ROXICET) 5-325 MG tablet Take 1 tablet by mouth 2 (two) times daily as needed for severe pain. 01/29/2018: Patient states that she take 7.5 mg tid   . pantoprazole (PROTONIX) 20 MG tablet  TAKE (1) TABLET BY MOUTH TWICE A DAY BEFORE MEALS. (BREAKFAST AND SUPPER)   . potassium chloride 20 MEQ TBCR Take 20 mEq by mouth 2 (two) times daily.   . primidone (MYSOLINE) 50 MG tablet TAKE ONE TABLET BY MOUTH AT BEDTIME.   . Probiotic Product (PROBIOTIC PO) Take 1 tablet by mouth at bedtime.   . triamcinolone lotion (KENALOG) 0.1 % Apply 1 application topically 3 (three) times daily. For up to 14 days and as needed   . TRUE METRIX BLOOD GLUCOSE TEST test strip USE TO TEST BLOOD SUGAR AS DIRECTED.   . Vitamin A 2400 MCG (8000 UT) CAPS Take 8,000 Units by mouth every morning.    . warfarin (COUMADIN) 4 MG tablet TAKE 1 TABLET BY MOUTH  ONCE DAILY EXCEPT ON THURSDAY TAKE 1/2 TABLET   . nitroGLYCERIN (NITROSTAT) 0.4 MG SL tablet Place 0.4 mg under the tongue every 5 (five) minutes as needed for chest pain. 05/10/2019: States she has not taken, not needed, will need new refill, and she will follow up with MD.    No facility-administered encounter medications on file as of 05/10/2019.    Fall Risk  05/10/2019 12/10/2018 08/10/2018 04/29/2018 12/26/2017  Falls in the past year? 1 0 0 1 No  Number falls in past yr: 1 - - 0 -  Injury with Fall? 1 - - 0 -  Comment - - - - -  Risk Factor Category  - - - - -  Comment - - - - -  Risk for fall due to : History of fall(s) - - - -  Risk for fall due to: Comment - - - - -  Follow up Falls evaluation completed;Education provided - - - [facility] CM Care Plan Problem One     Most Recent Value  Care Plan Problem One  Patient hospitalized 04/27/2019 - 05/02/2019 for acute on chronic combined systolic diastolic congestive heart failure  Role Documenting the Problem One  Care Management Telephonic Coordinator  Care Plan for Problem One  Active  THN Long Term Goal   Patient will verablize no hospitalization for any reason over the next 31 days.  THN Long Term Goal Start Date  05/10/19  Interventions for Problem One Long Term Goal  RNCM educated patient on signs / symptoms to report to MD.  [facility] CM Short Term Goal #1   In the next 30 days patient will verbalized that she has attended all hospital MD follow up appointments.  THN CM Short Term Goal #1 Start Date  05/10/19  Interventions for Short Term Goal #1  RNCM verified with patient dates of all hospital follow up appointments.  THN CM Short Term Goal #2   In the next 30 days patient will verbalize that is receiving home health services and actively participating with visits.  THN CM Short Term Goal #2 Start Date  05/10/19  Interventions for Short Term Goal #2  RNCM verified with patient that home health services have completed start of care and  reviewed next scheduled visit.         Plan:RNCM will send patient welcome outreach letter, welcome packet, consent, THN pamphlet, [facility] calendar, and magnet. RNCM will send primary MD barrier/ involvement letter and route assessment.  RNCM will refer patient to [place] Management Social Worker for caregiver resources, housekeeping resources, visual aid/ support services resources, Advanced Directive packet, and follow up on document completion.  RNCM will call patient for 2nd telephone outreach attempt  within  7 business days, transition of care/ care coordination follow up, and proceed with case closure, within 10 business days if no return call.     Latravis Grine H. Annia Friendly, BSN, [place] Management [facility] Telephonic CM Phone: [phone] Fax: [phone]

## 2019-05-11 ENCOUNTER — Encounter: Payer: Self-pay | Admitting: *Deleted

## 2019-05-11 ENCOUNTER — Other Ambulatory Visit: Payer: Self-pay | Admitting: *Deleted

## 2019-05-11 DIAGNOSIS — D631 Anemia in chronic kidney disease: Secondary | ICD-10-CM | POA: Diagnosis not present

## 2019-05-11 DIAGNOSIS — E1122 Type 2 diabetes mellitus with diabetic chronic kidney disease: Secondary | ICD-10-CM | POA: Diagnosis not present

## 2019-05-11 DIAGNOSIS — I5043 Acute on chronic combined systolic (congestive) and diastolic (congestive) heart failure: Secondary | ICD-10-CM | POA: Diagnosis not present

## 2019-05-11 DIAGNOSIS — N183 Chronic kidney disease, stage 3 unspecified: Secondary | ICD-10-CM | POA: Diagnosis not present

## 2019-05-11 DIAGNOSIS — I130 Hypertensive heart and chronic kidney disease with heart failure and stage 1 through stage 4 chronic kidney disease, or unspecified chronic kidney disease: Secondary | ICD-10-CM | POA: Diagnosis not present

## 2019-05-11 DIAGNOSIS — M1030 Gout due to renal impairment, unspecified site: Secondary | ICD-10-CM | POA: Diagnosis not present

## 2019-05-11 NOTE — Patient Outreach (Signed)
[place] [facility]) Care Management  05/11/2019  Denise Freeman [facility] 11/20/1950 [id number]   CSW made an initial attempt to try and contact patient today to perform the initial phone assessment, as well as assess and assist with social work needs and services, without success.  A HIPAA compliant message was left for patient on voicemail.  CSW is currently awaiting a return call.  CSW will make a second outreach attempt within the next 3-4 business days, if a return call is not received from patient in the meantime.  CSW will also mail an Outreach Letter to patient's home requesting that patient contact CSW if patient is interested in receiving social work services through [place] with Scientist, clinical (histocompatibility and immunogenetics).  Nat Christen, BSW, MSW, LCSW  Licensed Education officer, environmental Health System  Mailing [place] [address] [place], [place] [address] Physical Address-[address], [place], [place] [address] Toll Free Main # [phone] Fax # [phone] Cell # [phone]  Office # [phone] [name]

## 2019-05-14 DIAGNOSIS — M1030 Gout due to renal impairment, unspecified site: Secondary | ICD-10-CM | POA: Diagnosis not present

## 2019-05-14 DIAGNOSIS — I130 Hypertensive heart and chronic kidney disease with heart failure and stage 1 through stage 4 chronic kidney disease, or unspecified chronic kidney disease: Secondary | ICD-10-CM | POA: Diagnosis not present

## 2019-05-14 DIAGNOSIS — D631 Anemia in chronic kidney disease: Secondary | ICD-10-CM | POA: Diagnosis not present

## 2019-05-14 DIAGNOSIS — I5043 Acute on chronic combined systolic (congestive) and diastolic (congestive) heart failure: Secondary | ICD-10-CM | POA: Diagnosis not present

## 2019-05-14 DIAGNOSIS — N183 Chronic kidney disease, stage 3 unspecified: Secondary | ICD-10-CM | POA: Diagnosis not present

## 2019-05-14 DIAGNOSIS — E1122 Type 2 diabetes mellitus with diabetic chronic kidney disease: Secondary | ICD-10-CM | POA: Diagnosis not present

## 2019-05-17 ENCOUNTER — Encounter: Payer: Self-pay | Admitting: *Deleted

## 2019-05-17 ENCOUNTER — Other Ambulatory Visit: Payer: Self-pay | Admitting: *Deleted

## 2019-05-17 DIAGNOSIS — M1030 Gout due to renal impairment, unspecified site: Secondary | ICD-10-CM | POA: Diagnosis not present

## 2019-05-17 DIAGNOSIS — E1122 Type 2 diabetes mellitus with diabetic chronic kidney disease: Secondary | ICD-10-CM | POA: Diagnosis not present

## 2019-05-17 DIAGNOSIS — N183 Chronic kidney disease, stage 3 unspecified: Secondary | ICD-10-CM | POA: Diagnosis not present

## 2019-05-17 DIAGNOSIS — I5043 Acute on chronic combined systolic (congestive) and diastolic (congestive) heart failure: Secondary | ICD-10-CM | POA: Diagnosis not present

## 2019-05-17 DIAGNOSIS — D631 Anemia in chronic kidney disease: Secondary | ICD-10-CM | POA: Diagnosis not present

## 2019-05-17 DIAGNOSIS — I130 Hypertensive heart and chronic kidney disease with heart failure and stage 1 through stage 4 chronic kidney disease, or unspecified chronic kidney disease: Secondary | ICD-10-CM | POA: Diagnosis not present

## 2019-05-17 NOTE — Patient Outreach (Signed)
[place] [facility]) Care Management  05/17/2019  Denise Freeman [facility] September 08, 1950 [id number]  CSW was able to make initial contact with patient today to perform phone assessment, as well as assess and assist with social work needs and services.  CSW introduced self, explained role and types of services provided through [place] [facility] ([place] [facility]).  CSW further explained to patient that CSW works with patient's RNCM, also with [place] Management, [name]. CSW then explained the reason for the call, indicating that Mrs. Burt Knack thought that patient would benefit from social work services and resources to assist with completion of Advanced Directives ([place] documents), assist with arranging in-home caregiver services and assist with visual aid support and resources.  CSW obtained two HIPAA compliant identifiers from patient, which included patient's name and date of birth.  Patient indicated that she was waiting for her home health nurse, through [place], to arrive, at the time of CSW's call.  Patient stated, "My nurse checks my weight, the swelling in my feet and my distended stomach, because I have been retaining so much fluid".  Patient admitted that she would really benefit from an in-home caregiver, someone to prepare meals, do laundry, perform light housekeeping duties, assist with bathing and dressing, meal preparation, grocery shopping, running errands, etc.  Patient is aware that these services are not covered through Medicare, but denied wanting to apply for Adult Medicaid, through the [place].  Patient verbalized, "My husband and I have 100% coverage through [employer] (primary insurance) and [employer] ([employer]) so I don't want to mess that up".     CSW voiced understanding, but explained to patient that the only way in which she would be  able to receive in-home care services, without having to pay an out-of-pocket expense, would be to apply for Adult Medicaid.  CSW further explained to patient that CSW could assist patient with completion of applications for PCS (Personal Care Services) through the [place], as well as CAPS Forensic scientist) through the [place].  Patient declined assistance with completion of applications for PCS and CAPS, nor was patient interested in having CSW mail the applications directly to her home for her review and consideration.  Patient was agreeable; however, to having CSW send her a list of in-home care agencies, which CSW agreed to place in the mail to patient today.   Patient reported that she is now legally blind in both eyes, a direct result of taking the blood thinner, Xarelto, according to patient.  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  Patient denies experiencing symptoms of depression and/or anxiety, admitting that she is already taking Lexapro 10 MG, PO, Daily, to help combat symptoms.  Patient stated that she has always suffered from a "mood disorder", but knows how to manage her symptoms well.  CSW encouraged patient to continue to take her medications exactly as prescribed and communicate with her physician if she begins to experience complications.  Patient requested that CSW also mail her a list of community agencies and resources that accept individuals that are blind.  Patient verbalized, "I went to the [employer], but all they wanted to do was give me a job".  CSW agreed to perform an extensive search of  resources, print them out, then mail them to patient's home.  Patient also indicated that she is interested in completing her Advanced Directives; therefore, CSW will mail an Advanced Directives packet to patient's  home and assist with completion.  CSW will follow-up with patient again next week, on Monday, May 24, 2019, around 9:00am, to ensure that patient received the packet of resource information mailed to her home by CSW, as well as answer any questions that she may have at that time.  Patient has CSW's contact information and was encouraged to contact CSW directly if she has questions or needs assistance in the meantime.  Patient uses a cane to assist with ambulation, and also has an Transport planner, and patient's husband, Jessly Lebeck is able to transport patient to and from all her physician appointments.  Nat Christen, BSW, MSW, LCSW  Licensed Education officer, environmental Health System  Mailing [place] [address], [place], [place] [address] Physical Address-[address], [place], [place] [address] Toll Free Main # [phone] Fax # [phone] Cell # [phone]  Office # [phone] [name]

## 2019-05-17 NOTE — Patient Outreach (Addendum)
[facility]) Care Management  05/17/2019  Denise Freeman [facility] 1951/06/01 [id number]   Subjective: Telephone call to patient's home / mobile number, spoke with patient, and HIPAA verified.  States she remembers speaking with this RNCM in the past and is in agreement to transition of care  follow up at this time.  Patient states she is doing okay today, resting a lot, has been raining today, and the weather has always affected her breathing, especially when it is very raining.   States she is aware to conserve energy, continues to monitor shortnress of breath, monitoring weight, aware of signs and symptoms to report to MD, how to reach MD after hours if needed. States she had a home health nursing visit today, visit went well, weight today was 199, no signs of increasing swelling, and no change in weight. States she will have next home health RN visit on 05/21/2019.  States she has a follow up appointment with cardiologist on 05/18/2019 and with Coumadin clinic / MD on 05/20/2019.  States she had the following blood sugars today: 161, 173, and 173.  States her vision has worsened over time and has an appointment with Retina specialist at the end of the month, and is planning to discuss her concerns with specialist.  Patient states she received the welcome packet, [facility] calendar, and is appreciative of the information, has already started using the calendar.   States she spoke with the [place] Social Worker earlier today and the outreach went well.   Depression Screening assessment completed by Education officer, museum, per chart review, no need for RNCM to complete at this time.  Patient states she does not have any education material or pharmacy needs at this time.  States she is very appreciative of the follow up and is in agreement to continue to receive [place] Management services.    Objective:Per KPN (Knowledge Performance Now, point of care tool) and chart review,patient hospitalized  04/27/2019 - 05/02/2019 forAcute on chronic combined systolic and diastolic CHF (congestive heart failure). Patient hospitalized 01/22/2019 - 01/25/2019 for Atrial fibrillation,cute on chronic combined systolic and diastolic CHF (congestive heart failure). 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 Patient closed to [place] Management services on 02/10/2019 due to unable to contact.  Assessment: Received [facility] referral on 05/03/2019. Referral source: Sharley Hunnicutt. Referral reason:  Reason for consult ->disease and medication managment      Diagnoses of -> Heart Failure    Transition of care initial follow up completed.   Will follow up for weekly transition of care follow up calls,  follow up for congestive heart failure disease monitoring and education.  Will refer patient to [place] Management Social Worker for caregiver resources, housekeeping resources, visual aid/ support services resources, Advanced Directive packet, and follow up on document completion.   [facility] CM Care Plan Problem One     Most Recent Value  Care Plan Problem One  Patient hospitalized 04/27/2019 - 05/02/2019 for acute on chronic combined systolic diastolic congestive heart failure  Role Documenting the Problem One  Care Management Telephonic Coordinator  Care Plan for Problem One  Active  THN Long Term Goal   Patient will verablize no hospitalization for any reason over the next 31 days.  THN Long  Term Goal Start Date  05/10/19  Interventions for  Problem One Long Term Goal  RCM educated on how contact MD after hours.   THN CM Short Term Goal #1   In the next 30 days patient will verbalized that she has attended all hospital MD follow up appointments.  THN CM Short Term Goal #1 Start Date  05/10/19  Interventions for Short Term Goal #1  RNCM verified patient has a follow up appointment with cardiologist on 05/18/2019.   THN CM Short Term Goal #2   In the next 30 days patient will verbalize that is receiving home health services and actively participating with visits.  THN CM Short Term Goal #2 Start Date  05/10/19  Interventions for Short Term Goal #2  RNCM verified patient had home health RN visit today and will have next visit on 05/21/2019.        Plan:RNCM will call patient for telephone outreach attempt within  7 business days, transition of care/ care coordination follow up, and proceed with case closure, within 10 business days if no return call.    Gyasi Hazzard H. Annia Friendly, BSN, [place] Management [facility] Telephonic CM Phone: [phone] Fax: [phone]

## 2019-05-18 ENCOUNTER — Encounter (INDEPENDENT_AMBULATORY_CARE_PROVIDER_SITE_OTHER): Payer: Self-pay

## 2019-05-18 ENCOUNTER — Encounter: Payer: Self-pay | Admitting: Internal Medicine

## 2019-05-18 ENCOUNTER — Ambulatory Visit: Payer: Medicare Other | Admitting: Internal Medicine

## 2019-05-18 ENCOUNTER — Other Ambulatory Visit: Payer: Self-pay

## 2019-05-18 ENCOUNTER — Ambulatory Visit (INDEPENDENT_AMBULATORY_CARE_PROVIDER_SITE_OTHER): Payer: Medicare Other | Admitting: Internal Medicine

## 2019-05-18 VITALS — BP 124/74 | HR 55 | Temp 97.2°F | Ht 59.0 in | Wt 206.6 lb

## 2019-05-18 DIAGNOSIS — I4819 Other persistent atrial fibrillation: Secondary | ICD-10-CM

## 2019-05-18 DIAGNOSIS — N183 Chronic kidney disease, stage 3 unspecified: Secondary | ICD-10-CM | POA: Diagnosis not present

## 2019-05-18 DIAGNOSIS — I5023 Acute on chronic systolic (congestive) heart failure: Secondary | ICD-10-CM | POA: Diagnosis not present

## 2019-05-18 DIAGNOSIS — I10 Essential (primary) hypertension: Secondary | ICD-10-CM

## 2019-05-18 MED ORDER — METOLAZONE 2.5 MG PO TABS: ORAL_TABLET | ORAL | 3 refills | Status: DC

## 2019-05-18 NOTE — Progress Notes (Signed)
OFFICE NOTE  Chief Complaint:  Hospital follow-up  Primary Care Physician: Celene Squibb, MD  HPI:  Denise Freeman is a pleasant 68 year old female who is establishing cardiac care today. She is accompanied by her husband and they recently moved here from [place], [place]. She has a history of severe COPD on home oxygen. She also has a history of stress-induced cardiomyopathy in the past. She has since had recovery of her EF. She's had numerous cardiac catheterizations. None of which showed obstructive coronary disease. Her last cardiac catheterization was in October 2014 which demonstrated no significant coronary disease. This was after a small abnormality was noted in the apex suggestive of ischemia on a nuclear stress test. She does have a history of permanent atrial fibrillation on Coumadin. She will need to have her INRs followed here. Unfortunate she has not had her INR checked in over 9 weeks and it was assessed today and was low. We will need to adjust her medication. She will be established in our anticoagulation clinic. Blood pressure looks well controlled today. She is on cholesterol medication.  I saw Denise Freeman back today in the office. Unfortunate she was hospitalized in December for worsening shortness of breath and probable pneumonia with a mild diastolic heart failure exacerbation. She was given IV diuretics and her Lasix was increased to 40 mg 3 times a day. She's also taking metolazone 3 times weekly. She continues to complain of leg edema which is mostly dependent. She has significant shortness of breath and CO2 retention and an appointment with a pulmonologist is pending.  Denise Freeman returned today back in the office. Her main complaints are a number of excoriations and weeping lesions on her face and arms. She apparently is going to see a dermatologist about this. She's also had worsening leg swelling and shortness of breath. Her weight is now up about 8 pounds since her last  visit. She was instructed to take Lasix 40 mg 3 times a day but apparently she is taking it twice a day. She is taking the metolazone 3 times a week. She is wearing compression stockings which I had advised and seems that this helps her swelling.  Denise Freeman returns today and reports a big improvement in her leg swelling. She's currently on Lasix 60 mg twice daily and metolazone 3x weekly. Weight is come down and swelling is improved. Her BNP was over 250 and is come down to about 125.  I saw Denise Freeman back today in follow-up. She's been successful losing over 15 pounds which I congratulated her on. Her breathing continues to be fairly difficult. She is using BiPAP at night. She's been in the ER a few times for mostly difficulty breathing and COPD exacerbation. She tells me the other day she was in church and had an episode of sharp chest pain in her left anterior chest and left scapular area. She was quite upset and emotional today time. Recently she's been the target of possibly a scam, so that she is under a lot of stress. She was upset that she did not have anything to take for her chest discomfort. I tried to reassure her that her coronary arteries have look normal with multiple catheterizations. She does have some diastolic dysfunction I suspect her LVEDP goes up when she gets upset causing her chest discomfort.  10/31/2015  Denise Freeman was seen today in follow-up from her recent hospitalization. She was seen in January for acute GI bleeding thought to  be upper GI bleeding and required transfusion for hemoglobin of 5. She was evaluated by Dr. Silvano Rusk with [facility] who recommended an EGD. Unfortunately, it looks like she was discharged the next day, possibly [place] after she removed her IV and stated she needed to go home. She relates that she never saw the specialist and does not recall seeing Dr. Carlean Purl. Since that time she's continued to have blood in her stool.  She was taken off of Xarelto and not restarted on anticoagulation. She does have a high CHADSVASC score of 5, suggesting a higher risk of stroke.  08/07/2016  Denise Freeman returns for follow-up. She underwent GI work-up with no clear bleeding source. She does not want to be on anticoagulation for fear of another stroke. She attributes her vision loss and word-finding difficulty to her prior stroke on Xarelto. She was previously on warfarin in the distant past, but reported having difficulty maintaining a therapeutic INR. We discussed the possibility of a Watchman device today - she seems interested in this. I explained the procedure and will refer her to Dr. Rayann Heman for evaluation.  5/30/20181  Denise Freeman was seen today in follow-up. She recently saw Ignacia Bayley, NP, after hospital follow-up. Given her recurrent  cardiomyopathy with EF 30-35%, he recommended starting her on BiDil. This is based on recent lab work demonstrating significant renal insufficiency however repeat lab work shows normal creatinine. She recently has had 10 pound weight gain and feels that it is related to a decrease in her metolazone to a half tablet daily. Previously had had her on 80 mg Lasix twice a day and metolazone 2.5 mg daily.  12/09/2016  Denise Freeman returns today for follow-up. She reports some improvement in her fatigue and breathing. I placed her on both metolazone as well as a losartan. Today her blood pressures noted to be low at 90/50 and she is down about 6 pounds with a diuretic. Recently lab work indicated a small increase in her creatinine up to 1.35 with a baseline of about 1.0-1.1. She says a diuretic is doing good job of keeping her swelling off, however I wonder she may be a little bit over diuresis. In addition she may not be tolerating the ARB.  03/12/2017  Denise Freeman was seen today in follow-up. Her weight is now up about 13 pounds. She denies any worsening swelling. She thinks that it may be due to eating  her large meals at night. Her blood sugars have not been well controlled and apparently there is been some therapeutic disagreements between her an endocrinologist, she is been referred to another endocrinologist in [place]. She remains in permanent A. fib which is rate controlled. She's currently taking metolazone daily in addition to Lasix 80 mg twice a day. She denies any worsening shortness of breath or chest pain. She remains on home oxygen for COPD which is severe.   06/13/2017  Denise Freeman returns today for follow-up. She has had recurrent edema. See telephone notes for details. Weight was up 10 lbs. Her lasix was increased to 80 mg TID in addition to her 2.5 mg metolazone daily - apparently, her pharmacy is packaging this for her to take at night, but it would be better in the morning. Recently labs indicate creatinine of 1.6, therefore, we had to reduce her lasix. Weight today is 181 which is back at her dry weight. She also reports stopping her lipitor 2 weeks ago because of memory problems which she says is better.  12/03/2017  Denise Freeman returns today for follow-up.  We were contacted by her advanced home care nurse who noted that she has gained significant weight.  In June 2019 weight was 173 then up to 177 and now 182 about a month later.  She reports increasing abdominal girth but denies any lower extremity edema.  She is short of breath but it is consistent with her home shortness of breath.  She is also been on steroids for the past 2 months for gout.  She finished her last dose today and is transitioning over to allopurinol and colchicine.  In addition she has had worsening renal function.  She is scheduled for renal ultrasound in June and has a follow-up appointment with Dr. Lowanda Foster in [employer] in July.  She is also complaining of bilateral leg pain.  She says it is somewhat worse when she walks and improved at rest.  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  04/08/2018  Denise Freeman is seen today for follow-up.  Fortunately she is without complaints.  Her weight is stable if not declined a few pounds since I last saw her.  She seems to be doing well on this combination of 3 times daily Lasix as well as metolazone.  She is followed by her nephrologist in [place].  Renal function has stayed fairly stable.  She denies any worsening shortness of breath.  Blood pressure is well controlled today 126/60.  05/18/2019  Denise Freeman returns today for hospital follow-up.  Unfortunately despite escalating doses of diuretics she was admitted to [facility] with acute on chronic congestive heart failure.  This required IV diuretics.  On admission her weight was 94.5 kg and her discharge weight was 91.9 kg.  Today she reports feeling fairly well though she is noted some more lower extremity edema.  Her weight is gone up from 91.9 to 93.6 kg.  She was transitioned back from torsemide to Lasix 80 mg 3 times daily and is currently taking metolazone 2.5 mg daily.  She says the torsemide was not effective for her.   PMHx:  Past Medical History:  Diagnosis Date  . Allergy   . Anemia   . Anxiety   . Asthma   . Atrial fibrillation ([place])   . Bipolar 1 disorder ([place])   . 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 Cholelithiasis    a. 09/2016 s/p Lap Chole.  . Chronic bronchitis ([place])   . Chronic combined systolic and diastolic congestive heart failure ([place])    a. 09/2016 Echo: EF 30-35%.  . Colon polyps   . COPD (chronic obstructive pulmonary disease) ([place])   . Depression   . Family history of adverse reaction to anesthesia    Uncle was positive for malignant hyperthermia; patient had testing done and was negative.  . Fibromyalgia   . GERD (gastroesophageal reflux disease)   . Gout   . High cholesterol   . History of blood transfusion 06/2015   "bleeding from my rectum"  . History of hiatal hernia   .  HOH (hard of hearing)   . Hx of colonic polyps 03/21/2016   3 small adenomas no recall - co-morbidities  . Neuropathy    Disc Back   . NICM (nonischemic cardiomyopathy) ([place])    a. Previously worked up in [place], MD-->low EF with subsequent recovery.  Multiple caths (last ~ 2014 per pt report)--reportedly nl  cors;  b. 09/2016 Echo: EF 30-35%, antsept/apical HK, mild MR, mildly dil LA, mod dil RA;  c. 09/2016 Lexi MV: EF 26%, glob HK, sept DK, med size, mod intensity fixed septal defect - BBB/PVC related artifact, no ischemia.  . On home oxygen therapy    "3L; 24/7" (03/14/2016)  . OSA treated with BiPAP    uses biPAP, 10 (03/14/2016)  . Osteoarthritis   . Oxygen deficiency   . Pneumonia   . Type II diabetes mellitus ([place])     Past Surgical History:  Procedure Laterality Date  . APPENDECTOMY     "they busted"  . bladder stimulator     pt states, "it cannot be turned off; it's in my right hip; dead battery so it's not working anymore". (03/14/2016)  . BLADDER SUSPENSION     2003, 2006 and 2010  . CATARACT EXTRACTION W/PHACO Right 11/29/2014   Procedure: CATARACT EXTRACTION PHACO AND INTRAOCULAR LENS PLACEMENT (IOC);  Surgeon: Rutherford Guys, MD;  Location: [facility];  Service: Ophthalmology;  Laterality: Right;  CDE:3.81  . CATARACT EXTRACTION W/PHACO Left 12/13/2014   Procedure: CATARACT EXTRACTION PHACO AND INTRAOCULAR LENS PLACEMENT (IOC);  Surgeon: Rutherford Guys, MD;  Location: [facility];  Service: Ophthalmology;  Laterality: Left;  CDE:6.59  . CERVICAL DISC SURGERY N/A 2009   4, 6, and 7 cervical disc replaced  . CHOLECYSTECTOMY N/A 09/13/2016   Procedure: LAPAROSCOPIC CHOLECYSTECTOMY;  Surgeon: Rolm Bookbinder, MD;  Location: [facility];  Service: General;  Laterality: N/A;  . COLONOSCOPY WITH PROPOFOL N/A 03/15/2016   Procedure: COLONOSCOPY WITH PROPOFOL;  Surgeon: Gatha Mayer, MD;  Location: [facility];  Service: Endoscopy;  Laterality: N/A;  . ESOPHAGOGASTRODUODENOSCOPY (EGD) WITH  PROPOFOL N/A 03/15/2016   Procedure: ESOPHAGOGASTRODUODENOSCOPY (EGD) WITH PROPOFOL;  Surgeon: Gatha Mayer, MD;  Location: [facility];  Service: Endoscopy;  Laterality: N/A;  . HEEL SPUR SURGERY Bilateral   . HERNIA REPAIR    . I&D EXTREMITY Right 06/13/2015   Procedure: MINOR IRRIGATION AND DEBRIDEMENT EXTREMITY REMOVAL OF NAIL;  Surgeon: Daryll Brod, MD;  Location: [facility];  Service: Orthopedics;  Laterality: Right;  . TUBAL LIGATION    . UMBILICAL HERNIA REPAIR     w/mesh    FAMHx:  Family History  Problem Relation Age of Onset  . Heart disease Mother   . COPD Mother   . Diabetes Mother   . Breast cancer Mother   . Heart disease Father   . Hyperlipidemia Father   . COPD Sister   . Heart disease Sister   . Diabetes Sister   . Heart disease Maternal Grandmother   . Cancer Maternal Grandmother        stomach  . Cancer Maternal Grandfather        lung   . Bipolar disorder Brother     SOCHx:   reports that she quit smoking about 30 years ago. Her smoking use included cigarettes. She has a 28.00 pack-year smoking history. She has never used smokeless tobacco. She reports that she does not drink alcohol or use drugs.  ALLERGIES:  Allergies  Allergen Reactions  . Xarelto [Rivaroxaban] Other (See Comments)    Internal bleeding  . Ancef [Cefazolin] Nausea And Vomiting  . Levaquin [Levofloxacin In D5w] Other (See Comments)    "afib"  . Lyrica [Pregabalin] Hives  . Tamiflu [Oseltamivir Phosphate] Other (See Comments)    "water blisters"  . Zoloft [Sertraline Hcl] Other (See Comments)    Jaw problems, jittery  .  Augmentin [Amoxicillin-Pot Clavulanate] Itching  . Ciprofloxacin Itching and Nausea And Vomiting  . Haldol [Haloperidol] Other (See Comments)    Restless leg  . Nsaids Diarrhea  . Penicillins Itching, Nausea And Vomiting and Rash    Has patient had a PCN reaction causing immediate rash, facial/tongue/throat swelling, SOB or lightheadedness with  hypotension: Yes Has patient had a PCN reaction causing severe rash involving mucus membranes or skin necrosis: No Has patient had a PCN reaction that required hospitalization No Has patient had a PCN reaction occurring within the last 10 years: Yes If all of the above answers are "NO", then may proceed with Cephalosporin use.  . Topamax [Topiramate] Nausea Only    ROS: Pertinent items noted in HPI and remainder of comprehensive ROS otherwise negative.  HOME MEDS: Current Outpatient Medications  Medication Sig Dispense Refill  . albuterol (PROVENTIL) (2.5 MG/3ML) 0.083% nebulizer solution USE (1) IN NEBULIZER EVERY 6 HOURS AS NEEDED FOR SHORTNESS OF BREATH. 75 mL 0  . allopurinol (ZYLOPRIM) 100 MG tablet TAKE 1 TABLET BY MOUTH ONCE DAILY. 30 tablet 0  . budesonide (PULMICORT) 0.5 MG/2ML nebulizer solution Take 2 mLs (0.5 mg total) by nebulization 2 (two) times daily. 120 mL 11  . cetirizine (ZYRTEC) 10 MG tablet Take 1 tablet (10 mg total) by mouth at bedtime. 30 tablet 11  . cholecalciferol (VITAMIN D) 400 units TABS tablet Take 1 tablet (400 Units total) by mouth 2 (two) times daily. 180 each 3  . clobetasol cream (TEMOVATE) 0.05 %     . colchicine 0.6 MG tablet Take 1 tablet (0.6 mg total) by mouth as needed.    Marland Kitchen escitalopram (LEXAPRO) 10 MG tablet Take 1 tablet (10 mg total) by mouth at bedtime. 90 tablet 0  . FEROSUL 325 (65 Fe) MG tablet Take 1 tablet by mouth daily.    . fluticasone (FLONASE) 50 MCG/ACT nasal spray Place 1 spray into both nostrils 2 (two) times daily.    . furosemide (LASIX) 40 MG tablet Take 2 tablets (80 mg total) by mouth 3 (three) times daily. 120 tablet 1  . Insulin Glargine (BASAGLAR KWIKPEN) 100 UNIT/ML SOPN Inject 0.6 mLs (60 Units total) into the skin at bedtime. 15 mL 2  . insulin lispro (HUMALOG KWIKPEN) 100 UNIT/ML KiwkPen Inject 0.1-0.16 mLs (10-16 Units total) into the skin 3 (three) times daily before meals. 15 mL 2  . Insulin Pen Needle 32G X 4 MM  MISC Use to inject insulin 5 times daily 500 each 5  . ipratropium (ATROVENT) 0.02 % nebulizer solution USE (1) IN NEBULIZER EVERY 6 HOURS AS NEEDED FOR SHORTNESS OF BREATH. 62.5 mL 1  . losartan (COZAAR) 25 MG tablet Take 0.5 tablets (12.5 mg total) by mouth daily. 30 tablet 1  . metolazone (ZAROXOLYN) 2.5 MG tablet TAKE 1 TABLET (2.5 MG) 1 DAY AND  ALTERNATED 2 TABLETS (5 MG) EVERY OTHER DAY 135 tablet 3  . metoprolol succinate (TOPROL-XL) 25 MG 24 hr tablet TAKE 1 TABLET BY MOUTH TWICE DAILY 60 tablet 3  . mirtazapine (REMERON) 15 MG tablet Take 1 tablet (15 mg total) by mouth at bedtime. 90 tablet 0  . Misc Natural Products (TART CHERRY ADVANCED PO) Take by mouth daily.    . modafinil (PROVIGIL) 200 MG tablet Take 1 tablet (200 mg total) by mouth daily. 30 tablet 5  . mometasone (NASONEX) 50 MCG/ACT nasal spray Place 1 spray into the nose daily. 17 g 11  . montelukast (SINGULAIR) 10 MG tablet TAKE  ONE TABLET BY MOUTH AT BEDTIME. 28 tablet 11  . mupirocin cream (BACTROBAN) 2 % Apply 1 application topically 2 (two) times daily. 15 g 0  . nitroGLYCERIN (NITROSTAT) 0.4 MG SL tablet Place 0.4 mg under the tongue every 5 (five) minutes as needed for chest pain.    Marland Kitchen nystatin (NYSTATIN) powder Apply topically 2 (two) times daily. Apply under breasts 15 g 3  . ondansetron (ZOFRAN) 4 MG tablet Take 4 mg by mouth once.    Marland Kitchen oxyCODONE-acetaminophen (PERCOCET/ROXICET) 5-325 MG tablet Take 1 tablet by mouth 2 (two) times daily as needed for severe pain. 60 tablet 0  . pantoprazole (PROTONIX) 20 MG tablet TAKE (1) TABLET BY MOUTH TWICE A DAY BEFORE MEALS. (BREAKFAST AND SUPPER) 56 tablet 0  . potassium chloride 20 MEQ TBCR Take 20 mEq by mouth 2 (two) times daily. 60 tablet 1  . potassium chloride SA (KLOR-CON) 20 MEQ tablet Take 20 mEq by mouth 2 (two) times daily.    . primidone (MYSOLINE) 50 MG tablet TAKE ONE TABLET BY MOUTH AT BEDTIME. 30 tablet 5  . Probiotic Product (PROBIOTIC PO) Take 1 tablet by  mouth at bedtime.    . triamcinolone lotion (KENALOG) 0.1 % Apply 1 application topically 3 (three) times daily. For up to 14 days and as needed 60 mL 1  . TRUE METRIX BLOOD GLUCOSE TEST test strip USE TO TEST BLOOD SUGAR AS DIRECTED. 50 each 2  . Vitamin A 2400 MCG (8000 UT) CAPS Take 8,000 Units by mouth every morning.     . warfarin (COUMADIN) 4 MG tablet TAKE 1 TABLET BY MOUTH ONCE DAILY EXCEPT ON THURSDAY TAKE 1/2 TABLET 30 tablet 11   No current facility-administered medications for this visit.    LABS/IMAGING: No results found for this or any previous visit (from the past 48 hour(s)). No results found.  VITALS: BP 124/74   Pulse (!) 55   Temp (!) 97.2 F (36.2 C)   Ht 4\' 11"  (1.499 m)   Wt 206 lb 9.6 oz (93.7 kg)   SpO2 94%   BMI 41.73 kg/m   EXAM: General appearance: alert, appears older than stated age, no distress, morbidly obese and On chronic nasal cannula oxygen Neck: no carotid bruit and no JVD Lungs: diminished breath sounds bilaterally Heart: irregularly irregular rhythm Abdomen: soft, non-tender; bowel sounds normal; no masses,  no organomegaly Extremities: edema 1+ bilateral LE edema and varicose veins noted Pulses: 1+ DP/PT pulses Skin: Skin color, texture, turgor normal. No rashes or lesions Neurologic: Grossly normal Psych: Pleasant  EKG: A. fib with PVCs at 79, low voltage QRS-personally reviewed  ASSESSMENT: 1. Permanent atrial fibrillation - not on anticoagulation due to recent GI bleeding and vision loss attributed to stroke 2. Acute on Chronic combined congestive heart failure - EF 30-35% 3. History of stress-induced cardiomyopathy 4. No significant obstructive coronary disease after multiple catheterizations in 1996, 99, 2007 and 2014. 5. Fibromyalgia 6. Bipolar 1 disorder 7. Dyslipidemia 8. Hypertension 9. Severe COPD on home oxygen 10. Obstructive sleep apnea on CPAP 11. Morbid obesity - with weight loss recently 12. DNR  PLAN: 1.    Denise Freeman had recent acute on chronic systolic congestive heart failure.  She diuresed well and was transitioned back to 3 times daily furosemide 80 mg as well as metolazone 2.5 mg daily.  She is now gaining back some weight and I think would benefit from alternating 2.5 and 5 mg every other day dosing of metolazone.  She should monitor weight closely and she does have home health assistance who monitors that very closely.  Plan follow-up with me in 3 months or sooner as necessary.  Pixie Casino, MD, [facility], [place] Director of the Advanced Lipid Disorders &  Cardiovascular Risk Reduction Clinic Diplomate of the American Board of Clinical Lipidology Attending Cardiologist  Direct Dial: [phone]  Fax: [phone]  Website:  [email or web address] Osgood Kwanza Cancelliere 05/18/2019, 11:36 AM

## 2019-05-18 NOTE — Patient Instructions (Signed)
Medication Instructions: CONTINUE TAKING LASIX 80 MG THREE TIMES DAILY. START TAKING  METOLAZONE 2.5MG  1 TABLET EVERY OTHER DAY ALTERNATING WITH  5 MG 2 TABS N TH OTHER DAYS.  *If you need a refill on your cardiac medications before your next appointment, please call your pharmacy*  Lab Work: NONE  ITesting/Procedures: NONE  Follow-Up: At [facility], you and your health needs are our priority.  As part of our continuing mission to provide you with exceptional heart care, we have created designated Provider Care Teams.  These Care Teams include your primary Cardiologist (physician) and Advanced Practice Providers (APPs -  Physician Assistants and Nurse Practitioners) who all work together to provide you with the care you need, when you need it.  Your next appointment:   3 month(s)  The format for your next appointment:   Either In Person or Virtual  Provider:   Raliegh Ip Mali Hilty, MD

## 2019-05-21 ENCOUNTER — Other Ambulatory Visit: Payer: Self-pay | Admitting: Pulmonary Disease

## 2019-05-21 DIAGNOSIS — I5043 Acute on chronic combined systolic (congestive) and diastolic (congestive) heart failure: Secondary | ICD-10-CM | POA: Diagnosis not present

## 2019-05-21 DIAGNOSIS — M1030 Gout due to renal impairment, unspecified site: Secondary | ICD-10-CM | POA: Diagnosis not present

## 2019-05-21 DIAGNOSIS — E1122 Type 2 diabetes mellitus with diabetic chronic kidney disease: Secondary | ICD-10-CM | POA: Diagnosis not present

## 2019-05-21 DIAGNOSIS — I130 Hypertensive heart and chronic kidney disease with heart failure and stage 1 through stage 4 chronic kidney disease, or unspecified chronic kidney disease: Secondary | ICD-10-CM | POA: Diagnosis not present

## 2019-05-21 DIAGNOSIS — D631 Anemia in chronic kidney disease: Secondary | ICD-10-CM | POA: Diagnosis not present

## 2019-05-21 DIAGNOSIS — N183 Chronic kidney disease, stage 3 unspecified: Secondary | ICD-10-CM | POA: Diagnosis not present

## 2019-05-24 ENCOUNTER — Other Ambulatory Visit: Payer: Self-pay | Admitting: *Deleted

## 2019-05-24 NOTE — Patient Outreach (Signed)
[facility]) Care Management  05/24/2019  Letoya Stallone [facility] February 13, 1951 [id number]   CSW was able to make brief contact with patient today to follow-up regarding social work services and resources, as well as to ensure that patient received the packet of resource information mailed to her home by CSW.  Patient admitted to receiving the packet of resources, indicating that she had a question about something that was enclosed in the packet, but was unable to recall what it was during the time of CSW's call.  CSW was able to recap everything that patient and CSW talked about during the initial phone assessment, which took place on Monday, May 17, 2019, but patient was still unable to remember the question that she needed to raise with CSW.  Patient admitted that she was exhausted this morning, not getting much sleep last night, indicating that she could not "think straight", requesting to be able to return CSW's call tomorrow morning.  CSW voiced understanding, ensuring that patient has the correct contact information for CSW.  Patient indicated that she plans to review the Advanced Directives ([place] documents), mailed to her by CSW, and will be prepared to complete the documents during her next conversation with CSW.  CSW agreed to follow-up with patient again on Monday, May 31, 2019, around 10:00am, if a return call is not received from patient in the meantime.  Nat Christen, BSW, MSW, LCSW  Licensed Education officer, [facility]  Mailing [place] [address] [place], [place] [address] Physical Address-[address], [place], [place] [address] Toll Free Main # [phone] Fax # [phone] Cell # [phone]  Office # [phone] [name]

## 2019-05-25 ENCOUNTER — Ambulatory Visit: Payer: Medicare Other | Admitting: *Deleted

## 2019-05-26 ENCOUNTER — Other Ambulatory Visit: Payer: Self-pay | Admitting: *Deleted
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
## 2019-05-27 DIAGNOSIS — D631 Anemia in chronic kidney disease: Secondary | ICD-10-CM | POA: Diagnosis not present

## 2019-05-27 DIAGNOSIS — M1030 Gout due to renal impairment, unspecified site: Secondary | ICD-10-CM | POA: Diagnosis not present

## 2019-05-27 DIAGNOSIS — N183 Chronic kidney disease, stage 3 unspecified: Secondary | ICD-10-CM | POA: Diagnosis not present

## 2019-05-27 DIAGNOSIS — I130 Hypertensive heart and chronic kidney disease with heart failure and stage 1 through stage 4 chronic kidney disease, or unspecified chronic kidney disease: Secondary | ICD-10-CM | POA: Diagnosis not present

## 2019-05-27 DIAGNOSIS — E1122 Type 2 diabetes mellitus with diabetic chronic kidney disease: Secondary | ICD-10-CM | POA: Diagnosis not present

## 2019-05-27 DIAGNOSIS — I5043 Acute on chronic combined systolic (congestive) and diastolic (congestive) heart failure: Secondary | ICD-10-CM | POA: Diagnosis not present

## 2019-05-29 DIAGNOSIS — F419 Anxiety disorder, unspecified: Secondary | ICD-10-CM | POA: Diagnosis not present

## 2019-05-29 DIAGNOSIS — G4733 Obstructive sleep apnea (adult) (pediatric): Secondary | ICD-10-CM | POA: Diagnosis not present

## 2019-05-29 DIAGNOSIS — N1832 Chronic kidney disease, stage 3b: Secondary | ICD-10-CM | POA: Diagnosis not present

## 2019-05-29 DIAGNOSIS — E1122 Type 2 diabetes mellitus with diabetic chronic kidney disease: Secondary | ICD-10-CM | POA: Diagnosis not present

## 2019-05-29 DIAGNOSIS — G894 Chronic pain syndrome: Secondary | ICD-10-CM | POA: Diagnosis not present

## 2019-05-29 DIAGNOSIS — J9612 Chronic respiratory failure with hypercapnia: Secondary | ICD-10-CM | POA: Diagnosis not present

## 2019-05-29 DIAGNOSIS — F319 Bipolar disorder, unspecified: Secondary | ICD-10-CM | POA: Diagnosis not present

## 2019-05-29 DIAGNOSIS — E782 Mixed hyperlipidemia: Secondary | ICD-10-CM | POA: Diagnosis not present

## 2019-05-29 DIAGNOSIS — I4581 Long QT syndrome: Secondary | ICD-10-CM | POA: Diagnosis not present

## 2019-05-29 DIAGNOSIS — D631 Anemia in chronic kidney disease: Secondary | ICD-10-CM | POA: Diagnosis not present

## 2019-05-29 DIAGNOSIS — J438 Other emphysema: Secondary | ICD-10-CM | POA: Diagnosis not present

## 2019-05-29 DIAGNOSIS — I130 Hypertensive heart and chronic kidney disease with heart failure and stage 1 through stage 4 chronic kidney disease, or unspecified chronic kidney disease: Secondary | ICD-10-CM | POA: Diagnosis not present

## 2019-05-29 DIAGNOSIS — M1030 Gout due to renal impairment, unspecified site: Secondary | ICD-10-CM | POA: Diagnosis not present

## 2019-05-29 DIAGNOSIS — H9190 Unspecified hearing loss, unspecified ear: Secondary | ICD-10-CM | POA: Diagnosis not present

## 2019-05-29 DIAGNOSIS — I428 Other cardiomyopathies: Secondary | ICD-10-CM | POA: Diagnosis not present

## 2019-05-29 DIAGNOSIS — K219 Gastro-esophageal reflux disease without esophagitis: Secondary | ICD-10-CM | POA: Diagnosis not present

## 2019-05-29 DIAGNOSIS — M797 Fibromyalgia: Secondary | ICD-10-CM | POA: Diagnosis not present

## 2019-05-29 DIAGNOSIS — E1140 Type 2 diabetes mellitus with diabetic neuropathy, unspecified: Secondary | ICD-10-CM | POA: Diagnosis not present

## 2019-05-29 DIAGNOSIS — H542X11 Low vision right eye category 1, low vision left eye category 1: Secondary | ICD-10-CM | POA: Diagnosis not present

## 2019-05-29 DIAGNOSIS — E1165 Type 2 diabetes mellitus with hyperglycemia: Secondary | ICD-10-CM | POA: Diagnosis not present

## 2019-05-29 DIAGNOSIS — I4821 Permanent atrial fibrillation: Secondary | ICD-10-CM | POA: Diagnosis not present

## 2019-05-29 DIAGNOSIS — M1990 Unspecified osteoarthritis, unspecified site: Secondary | ICD-10-CM | POA: Diagnosis not present

## 2019-05-29 DIAGNOSIS — I5043 Acute on chronic combined systolic (congestive) and diastolic (congestive) heart failure: Secondary | ICD-10-CM | POA: Diagnosis not present

## 2019-05-29 DIAGNOSIS — J9611 Chronic respiratory failure with hypoxia: Secondary | ICD-10-CM | POA: Diagnosis not present

## 2019-05-31 ENCOUNTER — Other Ambulatory Visit: Payer: Self-pay | Admitting: *Deleted

## 2019-05-31 ENCOUNTER — Ambulatory Visit: Payer: Self-pay | Admitting: *Deleted

## 2019-05-31 NOTE — Patient Outreach (Signed)
[facility]) Care Management  05/31/2019  Concetta Guion [facility] [birth year] [id number]   CSW made an attempt to try and contact patient today to follow-up regarding social work services and resources, as well as assist with completion of Advanced Directives ([place] documents); however, patient was unavailable at the time of CSW's call.  CSW left a HIPAA compliant message for patient on voicemail and is currently awaiting a return call.  CSW will make a second outreach attempt within the next 3-4 business days, if a return call is not received from patient in the meantime.    Nat Christen, BSW, MSW, LCSW  Licensed Education officer, environmental Health System  Mailing [place] [address] [place], [place] [address] Physical Address-[address], [place], [place] [address] Toll Free Main # [phone] Fax # [phone] Cell # [phone]  Office # [phone] [name]

## 2019-06-01 DIAGNOSIS — D631 Anemia in chronic kidney disease: Secondary | ICD-10-CM | POA: Diagnosis not present

## 2019-06-01 DIAGNOSIS — M1030 Gout due to renal impairment, unspecified site: Secondary | ICD-10-CM | POA: Diagnosis not present

## 2019-06-01 DIAGNOSIS — I130 Hypertensive heart and chronic kidney disease with heart failure and stage 1 through stage 4 chronic kidney disease, or unspecified chronic kidney disease: Secondary | ICD-10-CM | POA: Diagnosis not present

## 2019-06-01 DIAGNOSIS — N1832 Chronic kidney disease, stage 3b: Secondary | ICD-10-CM | POA: Diagnosis not present

## 2019-06-01 DIAGNOSIS — E1122 Type 2 diabetes mellitus with diabetic chronic kidney disease: Secondary | ICD-10-CM | POA: Diagnosis not present

## 2019-06-01 DIAGNOSIS — I5043 Acute on chronic combined systolic (congestive) and diastolic (congestive) heart failure: Secondary | ICD-10-CM | POA: Diagnosis not present

## 2019-06-02 ENCOUNTER — Other Ambulatory Visit: Payer: Self-pay | Admitting: *Deleted

## 2019-06-02 NOTE — Patient Outreach (Addendum)
[facility]) Care Management  06/02/2019  Denise Freeman [facility] 04-Dec-1950 [id number]   Subjective: Telephone call to patient's home number, spoke with patient, and HIPAA verified.  States she remembers speaking with this RNCM in the past.  States she is doing fair today, continues to deal with shortness of breath as needed, continues to utilize energy conservation techniques, continues to listen to her body, is following MD's ([name]) recommendations to avoid weather as needed to prevent heart and lung disease exacerbation.    States she continues to take pain medications as needed for chronic pain, is more achy today due to cold weather, and pain medication is managing pain until she is able to take next dose.   Patient states she had to cancel 05/31/2019 Eye MD appointment due to weather and will call back to reschedule at a later time.  Discussed obtaining letter from MD regarding weather related restrictions, patient voices understanding, is in agreement, states she will follow up with MD if needed, and if a providers gives her a hard time regarding rescheduling appointments.  States she is wanting to increase the variety of foods in her diet, and is planning to follow up with provider, to see if she can periodically eat crabs.  Patient states is continuing to follow up on the resources received from [place] Worker and in need of an additional Runner, broadcasting/film/video.   States she had a friend's lift chair in the past, it was very uncomfortable, does not want a lift chair, would like assistance with obtaining a recliner that has a leg lift button on the top of chair versus on the side, due to her small stature, and difficulty breathing with exertion.   Patient in agreement to additional referral to [place] Management Social Worker for Estate agent resources assistance.   Patient continues to receive home health services and services are going well.   Patient  states she does not have any education material, or pharmacy needs at this time.  States she is very appreciative of the follow up and is in agreement to continue to receive [place] Management services.   Telephone call to [place] Worker at [facility] Management, no answer, left message, advised have questions regarding patient's additional  new recliner referral needs, and requested call back.     Objective:Per KPN (Knowledge Performance Now, point of care tool) and chart review,patient hospitalized 04/27/2019 - 05/02/2019 forAcute on chronic combined systolic and diastolic CHF (congestive heart failure). Patient hospitalized 01/22/2019 - 01/25/2019 for Atrial fibrillation,cute on chronic combined systolic and diastolic CHF (congestive heart failure). 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 Patient closed to [place] Management services on 02/10/2019 due to unable to contact.    Assessment: Received [facility] referral on 05/03/2019. Referral source: Sharley Hunnicutt. Referral reason:   Reason for consult ->disease and medication managment      Diagnoses of -> Heart Failure  Transition of careinitialfollow up completed. Will follow up for weekly transition of care follow up calls, follow up for congestive heart failure disease monitoring and education. Patient has been referred to [place] Management Social Worker for caregiver resources, housekeeping resources, visual aid/ support services resources,Advanced  Directive packet, and follow up on document  completion.   [facility] CM Care Plan Problem One     Most Recent Value  Care Plan Problem One  Patient hospitalized 04/27/2019 - 05/02/2019 for acute on chronic combined systolic diastolic congestive heart failure  Role Documenting the Problem One  Care Management Telephonic Coordinator  Care Plan for Problem One  Active  THN Long Term Goal   Patient will verablize no hospitalization for any reason over the next 31 days.  THN Long Term Goal Start Date  05/10/19  Interventions for Problem One Long Term Goal  RNCM encouraged patient to continue to listen to her body and avoid outside weather as needed.   THN CM Short Term Goal #1   In the next 30 days patient will verbalized that she has attended all hospital MD follow up appointments.  THN CM Short Term Goal #1 Start Date  05/10/19  Interventions for Short Term Goal #1  RNCM verified patient not able to attend 05/31/2019 Eye MD visit due to weather. Encouraged patient to obtain letter from MD regarding  weather restrictions.   THN CM Short Term Goal #2   In the next 30 days patient will verbalize that is receiving home health services and actively participating with visits.  THN CM Short Term Goal #2 Start Date  05/10/19  Interventions for Short Term Goal #2  RNCM verified patient still receiving home health services.      Plan:RNCM will send Nat Christen Social Worker at [place] Management in-basket message regarding additional community resource needs.  RNCM will call patient for telephone outreach attempt within7business days, transition of care/ care coordination follow up, and proceed with case closure, within 10 business days if no return call.   Ceylon Arenson H. Annia Friendly, BSN, [place] Management [facility] Telephonic CM Phone: [phone] Fax: [phone]

## 2019-06-07 ENCOUNTER — Other Ambulatory Visit: Payer: Self-pay | Admitting: *Deleted

## 2019-06-07 ENCOUNTER — Telehealth: Payer: Self-pay | Admitting: *Deleted

## 2019-06-07 DIAGNOSIS — I130 Hypertensive heart and chronic kidney disease with heart failure and stage 1 through stage 4 chronic kidney disease, or unspecified chronic kidney disease: Secondary | ICD-10-CM | POA: Diagnosis not present

## 2019-06-07 DIAGNOSIS — M1030 Gout due to renal impairment, unspecified site: Secondary | ICD-10-CM | POA: Diagnosis not present

## 2019-06-07 DIAGNOSIS — D631 Anemia in chronic kidney disease: Secondary | ICD-10-CM | POA: Diagnosis not present

## 2019-06-07 DIAGNOSIS — E1122 Type 2 diabetes mellitus with diabetic chronic kidney disease: Secondary | ICD-10-CM | POA: Diagnosis not present

## 2019-06-07 DIAGNOSIS — I5043 Acute on chronic combined systolic (congestive) and diastolic (congestive) heart failure: Secondary | ICD-10-CM | POA: Diagnosis not present

## 2019-06-07 DIAGNOSIS — N1832 Chronic kidney disease, stage 3b: Secondary | ICD-10-CM | POA: Diagnosis not present
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
## 2019-06-07 NOTE — Telephone Encounter (Signed)
Patient called and cancelled CCR again due to weather to cold

## 2019-06-10 NOTE — Telephone Encounter (Signed)
Called pt.  No answer.  LMOM for pt to call back so we can figure out a time she can come for her INR check.

## 2019-06-11 ENCOUNTER — Other Ambulatory Visit: Payer: Self-pay | Admitting: *Deleted

## 2019-06-11 NOTE — Patient Outreach (Signed)
[facility]) Care Management  06/11/2019  Denise Freeman [facility] September 14, 1950 [id number]   Subjective: Telephone call to patient's home / mobile number, spoke with patient, and HIPAA verified.  Patient remembers speaking with this RNCM in the past, states she is doing well and has been staying in the house out of the weather.   States she is continuing to listen to her body and providers regarding rescheduling appointments if it is cold outside, no recent exacerbations.  States she will have a coumadin clinic appointment next week if weather permits.  Falls and Nutrition assessment completed.  Patient states she does not have any education material, transition of care, care coordination, transportation,  or pharmacy needs at this time. States she is very appreciative of the follow up and is in agreement to continue to receive [place] Management services.     Objective:Per KPN (Knowledge Performance Now, point of care tool) and chart review,patient hospitalized 04/27/2019 - 05/02/2019 forAcute on chronic combined systolic and diastolic CHF (congestive heart failure). Patient hospitalized 01/22/2019 - 01/25/2019 for Atrial fibrillation,cute on chronic combined systolic and diastolic CHF (congestive heart failure). 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 Patient closed to [place] Management services on 02/10/2019 due to unable to contact.    Assessment: Received [facility] referral on 05/03/2019. Referral source: Sharley Hunnicutt.  Referral reason:   Reason for consult ->disease and medication managment      Diagnoses of -> Heart Failure  Transition of carefollow up completed. Will continue to follow up monthly or bimonthly, for congestive heart failure disease monitoring and education.Patient has been referredto [place] Management Social Worker for caregiver resources, housekeeping resources, visual aid/ support services resources,recliner chair community resources, Advanced Directive packet, and follow up on document completion.    Plan: [facility] CM Care Plan Problem One     Most Recent Value  Care Plan Problem One  Patient hospitalized 04/27/2019 - 05/02/2019 for acute on chronic combined systolic diastolic congestive heart failure  Role Documenting the Problem One  Care Management Telephonic Coordinator  Care Plan for Problem One  Active  THN Long Term Goal   Patient will verablize no hospitalization for any reason over the next 31 days.  THN Long Term Goal Start Date  05/10/19  Interventions for Problem One Long Term Goal  RNCM encouraged patient to continue to listen to her body and follow activity weather restrictions.   THN CM Short Term Goal #1   In the next 30 days patient will verbalized that she has attended all hospital MD follow up appointments.  THN CM Short Term Goal #1 Start Date  05/10/19  Interventions for Short Term Goal #1  RNCM verified patient will have follow up  coumadin clinic appointment next week if weather permits.  THN CM Short Term Goal #2   In the next 30 days patient will verbalize that is receiving home health services and actively participating with visits.  THN CM Short Term Goal #2 Start Date  05/10/19  Interventions for Short Term Goal #2  RNCM verified patient continues to receive home health services, last visit 06/09/2019.      RNCM will call patient for telephone outreach attempt within30business days, congestive heart failure disease management/ monitoring /  education,  and proceed  with case closure, within 10 business days if no return call.   Nedim Oki H. Annia Friendly, BSN, [place] Management [facility] Telephonic CM Phone: [phone] Fax: [phone]

## 2019-06-14 ENCOUNTER — Other Ambulatory Visit: Payer: Self-pay | Admitting: *Deleted

## 2019-06-14 ENCOUNTER — Encounter: Payer: Self-pay | Admitting: *Deleted

## 2019-06-14 DIAGNOSIS — I130 Hypertensive heart and chronic kidney disease with heart failure and stage 1 through stage 4 chronic kidney disease, or unspecified chronic kidney disease: Secondary | ICD-10-CM | POA: Diagnosis not present

## 2019-06-14 DIAGNOSIS — M1030 Gout due to renal impairment, unspecified site: Secondary | ICD-10-CM | POA: Diagnosis not present

## 2019-06-14 DIAGNOSIS — N1832 Chronic kidney disease, stage 3b: Secondary | ICD-10-CM | POA: Diagnosis not present

## 2019-06-14 DIAGNOSIS — E1122 Type 2 diabetes mellitus with diabetic chronic kidney disease: Secondary | ICD-10-CM | POA: Diagnosis not present

## 2019-06-14 DIAGNOSIS — D631 Anemia in chronic kidney disease: Secondary | ICD-10-CM | POA: Diagnosis not present

## 2019-06-14 DIAGNOSIS — I5043 Acute on chronic combined systolic (congestive) and diastolic (congestive) heart failure: Secondary | ICD-10-CM | POA: Diagnosis not present

## 2019-06-14 NOTE — Patient Outreach (Signed)
[facility]) Care Management  06/14/2019  Denise Freeman [facility] 01/04/1951 [id number]   CSW was able to make contact with patient today to follow-up regarding social work services and resources, as well as assist patient with completion of Advanced Directives ([place] documents).  CSW first explained to patient that CSW has been checking into various community agencies and resources, but that none of them are able to offer financial assistance to help pay for a new recliner, as it is not considered a "medical necessity".  CSW also conversed with social work Medical laboratory scientific officer but they were unable to provide any additional resources.  Patient voiced understanding, indicating that she will just have to set aside some money each month to afford to purchase one on her own.  After a very lengthy conversation, CSW was able to assist patient with completion of her Living Will and [employer].  Once completed, CSW explained to patient that she will need to take the documents to a [place] affiliated facility, provided to her in the packet, to have her signature witnessed and the documents notarized.  Patient was then instructed to keep the original copy for herself, provide a copy to her designated power of attorney and provide a copy to her Primary Care Physician, Dr. [name] to scan into her EMR (Electronic Medical Record) in Epic.  Patient was also encouraged to take the Out-of-Facility DNR (Do Not Resuscitate) Form to her next scheduled appointment for completion.  CSW will perform a case closure on patient, as all goals of treatment have been met from social work standpoint and no additional social work needs have been identified at this time.  CSW will notify patient's Telephonic RNCM with [place] Management, [name] of CSW's plans to close patient's case.  CSW will fax an update to patient's Primary Care  Physician, Dr. [name] to ensure that they are aware of CSW's involvement with patient's plan of care.  CSW was able to confirm that patient has the correct contact information for CSW, encouraging patient to contact CSW directly if additional social work needs arise in the near future and patient needs further assistance.    Denise Freeman, BSW, MSW, LCSW  Licensed Education officer, environmental Health System  Mailing [place] [address], [place], [place] [address] Physical Address-[address], [place], [place] [address] Toll Free Main # [phone] Fax # [phone] Cell # [phone]  Office # [phone] [name]

## 2019-06-22 ENCOUNTER — Ambulatory Visit: Payer: Self-pay | Admitting: *Deleted

## 2019-06-22 DIAGNOSIS — M1030 Gout due to renal impairment, unspecified site: Secondary | ICD-10-CM | POA: Diagnosis not present

## 2019-06-22 DIAGNOSIS — E1122 Type 2 diabetes mellitus with diabetic chronic kidney disease: Secondary | ICD-10-CM | POA: Diagnosis not present

## 2019-06-22 DIAGNOSIS — N1832 Chronic kidney disease, stage 3b: Secondary | ICD-10-CM | POA: Diagnosis not present

## 2019-06-22 DIAGNOSIS — D631 Anemia in chronic kidney disease: Secondary | ICD-10-CM | POA: Diagnosis not present

## 2019-06-22 DIAGNOSIS — I5043 Acute on chronic combined systolic (congestive) and diastolic (congestive) heart failure: Secondary | ICD-10-CM | POA: Diagnosis not present

## 2019-06-22 DIAGNOSIS — I130 Hypertensive heart and chronic kidney disease with heart failure and stage 1 through stage 4 chronic kidney disease, or unspecified chronic kidney disease: Secondary | ICD-10-CM | POA: Diagnosis not present

## 2019-06-28 DIAGNOSIS — K219 Gastro-esophageal reflux disease without esophagitis: Secondary | ICD-10-CM | POA: Diagnosis not present

## 2019-06-28 DIAGNOSIS — G894 Chronic pain syndrome: Secondary | ICD-10-CM | POA: Diagnosis not present

## 2019-06-28 DIAGNOSIS — I4821 Permanent atrial fibrillation: Secondary | ICD-10-CM | POA: Diagnosis not present

## 2019-06-28 DIAGNOSIS — I130 Hypertensive heart and chronic kidney disease with heart failure and stage 1 through stage 4 chronic kidney disease, or unspecified chronic kidney disease: Secondary | ICD-10-CM | POA: Diagnosis not present

## 2019-06-28 DIAGNOSIS — M1990 Unspecified osteoarthritis, unspecified site: Secondary | ICD-10-CM | POA: Diagnosis not present

## 2019-06-28 DIAGNOSIS — I4581 Long QT syndrome: Secondary | ICD-10-CM | POA: Diagnosis not present

## 2019-06-28 DIAGNOSIS — D631 Anemia in chronic kidney disease: Secondary | ICD-10-CM | POA: Diagnosis not present

## 2019-06-28 DIAGNOSIS — H542X11 Low vision right eye category 1, low vision left eye category 1: Secondary | ICD-10-CM | POA: Diagnosis not present

## 2019-06-28 DIAGNOSIS — E1140 Type 2 diabetes mellitus with diabetic neuropathy, unspecified: Secondary | ICD-10-CM | POA: Diagnosis not present

## 2019-06-28 DIAGNOSIS — F419 Anxiety disorder, unspecified: Secondary | ICD-10-CM | POA: Diagnosis not present

## 2019-06-28 DIAGNOSIS — E782 Mixed hyperlipidemia: Secondary | ICD-10-CM | POA: Diagnosis not present

## 2019-06-28 DIAGNOSIS — E1165 Type 2 diabetes mellitus with hyperglycemia: Secondary | ICD-10-CM | POA: Diagnosis not present

## 2019-06-28 DIAGNOSIS — M1030 Gout due to renal impairment, unspecified site: Secondary | ICD-10-CM | POA: Diagnosis not present

## 2019-06-28 DIAGNOSIS — M797 Fibromyalgia: Secondary | ICD-10-CM | POA: Diagnosis not present

## 2019-06-28 DIAGNOSIS — J9612 Chronic respiratory failure with hypercapnia: Secondary | ICD-10-CM | POA: Diagnosis not present

## 2019-06-28 DIAGNOSIS — I5043 Acute on chronic combined systolic (congestive) and diastolic (congestive) heart failure: Secondary | ICD-10-CM | POA: Diagnosis not present

## 2019-06-28 DIAGNOSIS — N1832 Chronic kidney disease, stage 3b: Secondary | ICD-10-CM | POA: Diagnosis not present

## 2019-06-28 DIAGNOSIS — G4733 Obstructive sleep apnea (adult) (pediatric): Secondary | ICD-10-CM | POA: Diagnosis not present

## 2019-06-28 DIAGNOSIS — E1122 Type 2 diabetes mellitus with diabetic chronic kidney disease: Secondary | ICD-10-CM | POA: Diagnosis not present

## 2019-06-28 DIAGNOSIS — J9611 Chronic respiratory failure with hypoxia: Secondary | ICD-10-CM | POA: Diagnosis not present

## 2019-06-28 DIAGNOSIS — I428 Other cardiomyopathies: Secondary | ICD-10-CM | POA: Diagnosis not present

## 2019-06-28 DIAGNOSIS — J438 Other emphysema: Secondary | ICD-10-CM | POA: Diagnosis not present

## 2019-06-28 DIAGNOSIS — F319 Bipolar disorder, unspecified: Secondary | ICD-10-CM | POA: Diagnosis not present

## 2019-06-28 DIAGNOSIS — H9190 Unspecified hearing loss, unspecified ear: Secondary | ICD-10-CM | POA: Diagnosis not present

## 2019-07-01 ENCOUNTER — Ambulatory Visit (INDEPENDENT_AMBULATORY_CARE_PROVIDER_SITE_OTHER): Payer: Medicare Other | Admitting: *Deleted

## 2019-07-01 DIAGNOSIS — I5043 Acute on chronic combined systolic (congestive) and diastolic (congestive) heart failure: Secondary | ICD-10-CM | POA: Diagnosis not present

## 2019-07-01 DIAGNOSIS — E1122 Type 2 diabetes mellitus with diabetic chronic kidney disease: Secondary | ICD-10-CM | POA: Diagnosis not present

## 2019-07-01 DIAGNOSIS — M1030 Gout due to renal impairment, unspecified site: Secondary | ICD-10-CM | POA: Diagnosis not present

## 2019-07-01 DIAGNOSIS — Z5181 Encounter for therapeutic drug level monitoring: Secondary | ICD-10-CM

## 2019-07-01 DIAGNOSIS — D631 Anemia in chronic kidney disease: Secondary | ICD-10-CM | POA: Diagnosis not present

## 2019-07-01 DIAGNOSIS — I4891 Unspecified atrial fibrillation: Secondary | ICD-10-CM

## 2019-07-01 DIAGNOSIS — N1832 Chronic kidney disease, stage 3b: Secondary | ICD-10-CM | POA: Diagnosis not present

## 2019-07-01 DIAGNOSIS — I130 Hypertensive heart and chronic kidney disease with heart failure and stage 1 through stage 4 chronic kidney disease, or unspecified chronic kidney disease: Secondary | ICD-10-CM | POA: Diagnosis not present

## 2019-07-01 LAB — POCT INR: INR: 1.6 — AB (ref 2.0–3.0)

## 2019-07-01 NOTE — Patient Instructions (Signed)
Take warfarin 1 1/2 tablets tonight then increase dose to 1 tablet daily except 1/2 tablet on Sundays Recheck in 2 weeks Order given to [facility] while in pt's home.

## 2019-07-02 ENCOUNTER — Other Ambulatory Visit: Payer: Self-pay | Admitting: *Deleted

## 2019-07-02 ENCOUNTER — Other Ambulatory Visit: Payer: Self-pay | Admitting: Internal Medicine

## 2019-07-02 NOTE — Patient Outreach (Signed)
[facility]) Care Management  07/02/2019  Dana Debo [facility] [birth year] [id number]   Subjective: Telephone call to patient's home / mobile number, spoke with patient, and HIPAA verified.  Patient remembers speaking to the [facility] in the past.  Patient states she is doing as well as expected, do everything that she can for self, and pretty good overall.  States her husband is very supportive and provides assistance as needed.  States she attended coumadin clinic visit on 07/01/2019 and home health nurse is also following up on INR blood work as needed.  States she does not have any other upcoming follow up appointments in the near future.  States she is following up on all the resources identified by Rafael Gonzalez Worker and is trying to obtain new glasses ( approximate cost $199). States she is also looking into obtaining notebooks so she can take notes while paying bills.  Patient would cry off and on during the conversation at times when she spoke about calling the different resources and asking for help.  RNCM encouraged patient to continue to reach out to the organizations, try to speak to a different person to verify the available resources, continue to access available resources as needed, patient voices understanding, and states she will continue to follow up as needed.  Patient states this RNCM, and Social Worker have been very helpful, understanding, and she is very Patent attorney. Patient states she does not have any education material, transition of care, transportation, or pharmacy needs at this time.  States she is very appreciative of the follow up and is in agreement to continue to receive [place] Management services.      Objective:Per KPN (Knowledge Performance Now, point of care tool) and chart review,patient hospitalized 04/27/2019 - 05/02/2019 forAcute on chronic combined systolic and diastolic CHF (congestive heart failure). Patient hospitalized 01/22/2019 - 01/25/2019 for  Atrial fibrillation,cute on chronic combined systolic and diastolic CHF (congestive heart failure). 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 Patient closed to [place] Management services on 02/10/2019 due to unable to contact.    Assessment: Received [facility] referral on 05/03/2019. Referral source: Sharley Hunnicutt. Referral reason:  Reason for consult ->disease and medication managment      Diagnoses of -> Heart Failure  Transition of carefollow up completed. Will continue to follow up monthly or bimonthly, for congestive heart failure disease monitoring and education.Patient has been referredto [place] Management Social Worker for caregiver resources, housekeeping resources, visual aid/ support services resources,recliner chair community resources, Advanced Directive packet, and follow up on document completion.    Plan: [facility] CM Care Plan Problem One     Most Recent Value  Care Plan Problem One  Patient hospitalized 04/27/2019 - 05/02/2019 for acute on chronic combined systolic diastolic congestive heart failure  Role Documenting the Problem One  Care Management Telephonic Coordinator  Care Plan for Problem One  Active  THN Long Term Goal   Patient will verablize no hospitalization for any reason over the next 31 days.  THN Long Term Goal Start Date  05/10/19  Interventions for Problem One Long Term Goal  RNCM encouraged patient to continue communication with providers regarding weather restricitions as needed.    THN CM Short Term Goal #1   In the next  30 days patient will verbalized that she has attended all hospital MD follow up appointments.  THN CM Short Term Goal #1 Start Date  05/10/19  Interventions for Short Term Goal #1  RNCM verified patient attended coumadin clinic visit on 07/01/2019.  THN CM Short Term Goal #2   In the next 30 days patient will verbalize that is receiving home health services and actively participating with visits.  THN CM Short Term Goal #2 Start Date  05/10/19  [facility] CM Short Term Goal #2 Met Date  07/02/19  Interventions for Short Term Goal #2  RNCM verified patient continues to receiving home health services.       RNCM will call patient for telephone outreach attempt within60business days, congestive heart failure disease management/ monitoring / education.     Shanterica Biehler H. Annia Friendly, BSN, [place] Management [facility] Telephonic CM Phone: [phone] Fax: [phone]

## 2019-07-06 DIAGNOSIS — E1122 Type 2 diabetes mellitus with diabetic chronic kidney disease: Secondary | ICD-10-CM | POA: Diagnosis not present

## 2019-07-06 DIAGNOSIS — I130 Hypertensive heart and chronic kidney disease with heart failure and stage 1 through stage 4 chronic kidney disease, or unspecified chronic kidney disease: Secondary | ICD-10-CM | POA: Diagnosis not present

## 2019-07-06 DIAGNOSIS — N1832 Chronic kidney disease, stage 3b: Secondary | ICD-10-CM | POA: Diagnosis not present

## 2019-07-06 DIAGNOSIS — D631 Anemia in chronic kidney disease: Secondary | ICD-10-CM | POA: Diagnosis not present

## 2019-07-06 DIAGNOSIS — I5043 Acute on chronic combined systolic (congestive) and diastolic (congestive) heart failure: Secondary | ICD-10-CM | POA: Diagnosis not present

## 2019-07-06 DIAGNOSIS — M1030 Gout due to renal impairment, unspecified site: Secondary | ICD-10-CM | POA: Diagnosis not present

## 2019-07-07 ENCOUNTER — Other Ambulatory Visit: Payer: Self-pay | Admitting: Internal Medicine

## 2019-07-13 ENCOUNTER — Ambulatory Visit (INDEPENDENT_AMBULATORY_CARE_PROVIDER_SITE_OTHER): Payer: Medicare Other | Admitting: *Deleted

## 2019-07-13 DIAGNOSIS — M1030 Gout due to renal impairment, unspecified site: Secondary | ICD-10-CM | POA: Diagnosis not present

## 2019-07-13 DIAGNOSIS — D631 Anemia in chronic kidney disease: Secondary | ICD-10-CM | POA: Diagnosis not present

## 2019-07-13 DIAGNOSIS — I4891 Unspecified atrial fibrillation: Secondary | ICD-10-CM | POA: Diagnosis not present

## 2019-07-13 DIAGNOSIS — Z5181 Encounter for therapeutic drug level monitoring: Secondary | ICD-10-CM

## 2019-07-13 DIAGNOSIS — I130 Hypertensive heart and chronic kidney disease with heart failure and stage 1 through stage 4 chronic kidney disease, or unspecified chronic kidney disease: Secondary | ICD-10-CM | POA: Diagnosis not present

## 2019-07-13 DIAGNOSIS — I5043 Acute on chronic combined systolic (congestive) and diastolic (congestive) heart failure: Secondary | ICD-10-CM | POA: Diagnosis not present

## 2019-07-13 DIAGNOSIS — E1122 Type 2 diabetes mellitus with diabetic chronic kidney disease: Secondary | ICD-10-CM | POA: Diagnosis not present

## 2019-07-13 DIAGNOSIS — N1832 Chronic kidney disease, stage 3b: Secondary | ICD-10-CM | POA: Diagnosis not present

## 2019-07-13 LAB — POCT INR: INR: 2.5 (ref 2.0–3.0)

## 2019-07-13 NOTE — Patient Instructions (Signed)
Continue warfarin 1 tablet daily except 1/2 tablet on Sundays Recheck in 2 weeks Order given to [facility] while in pt's home.

## 2019-07-14 IMAGING — DX DG FOREARM 2V*R*
2 series · 2 of 2 positions shown · non-contrast
Comparison: None

CLINICAL DATA: Mid RIGHT forearm pain

EXAM:
RIGHT FOREARM - 2 VIEW

[forearm ap]
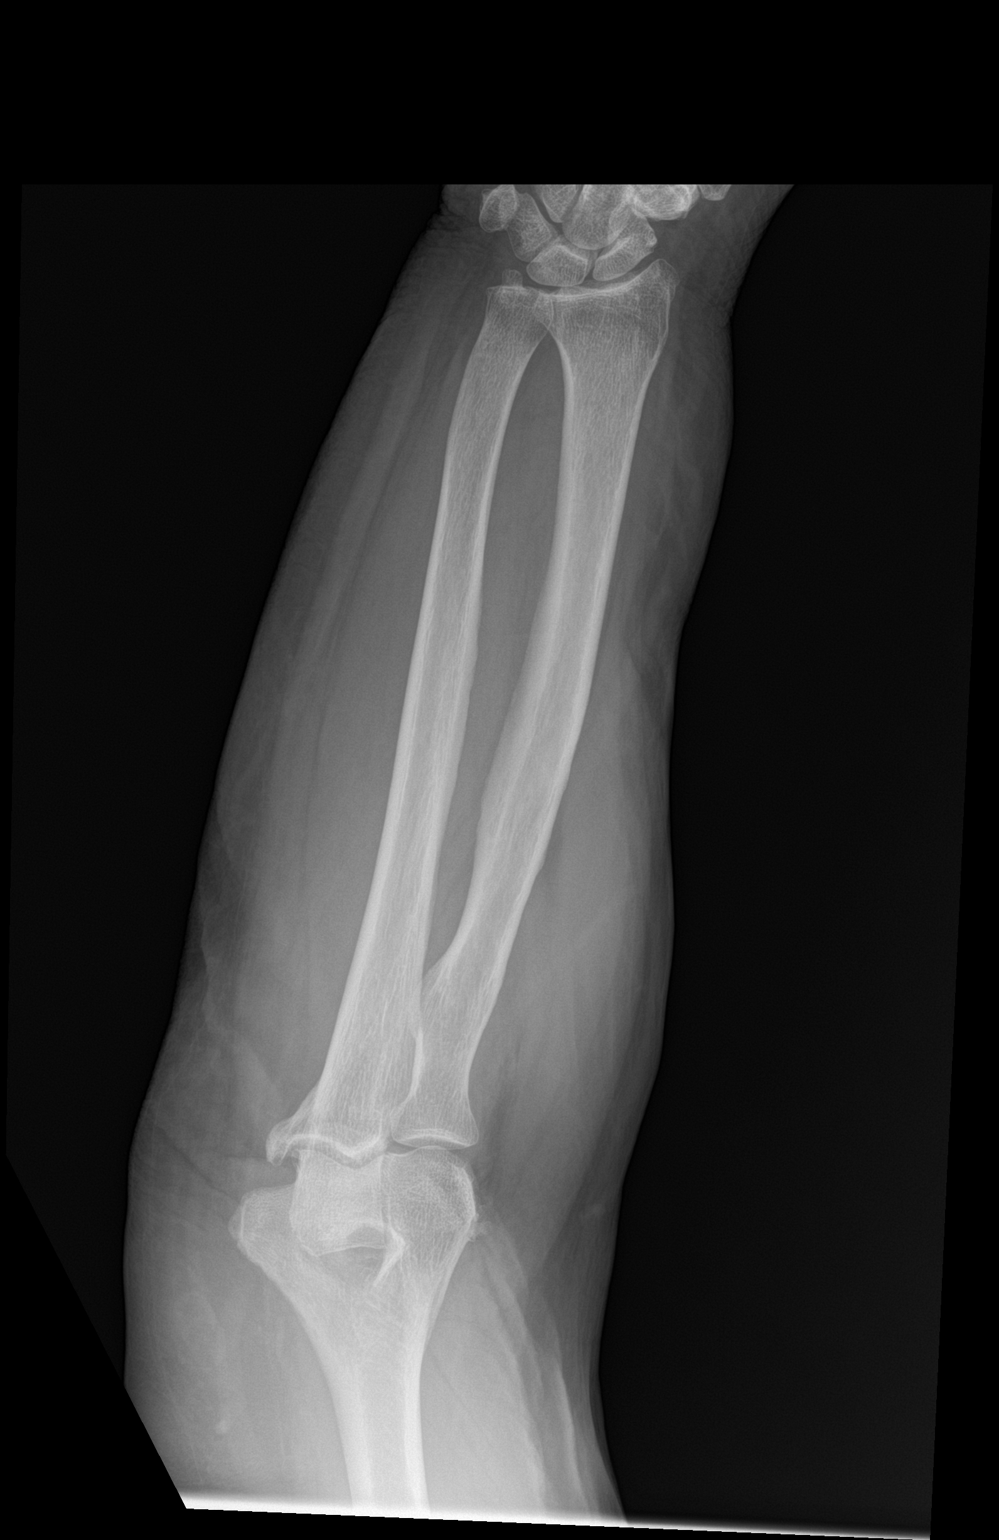

[forearm lat]
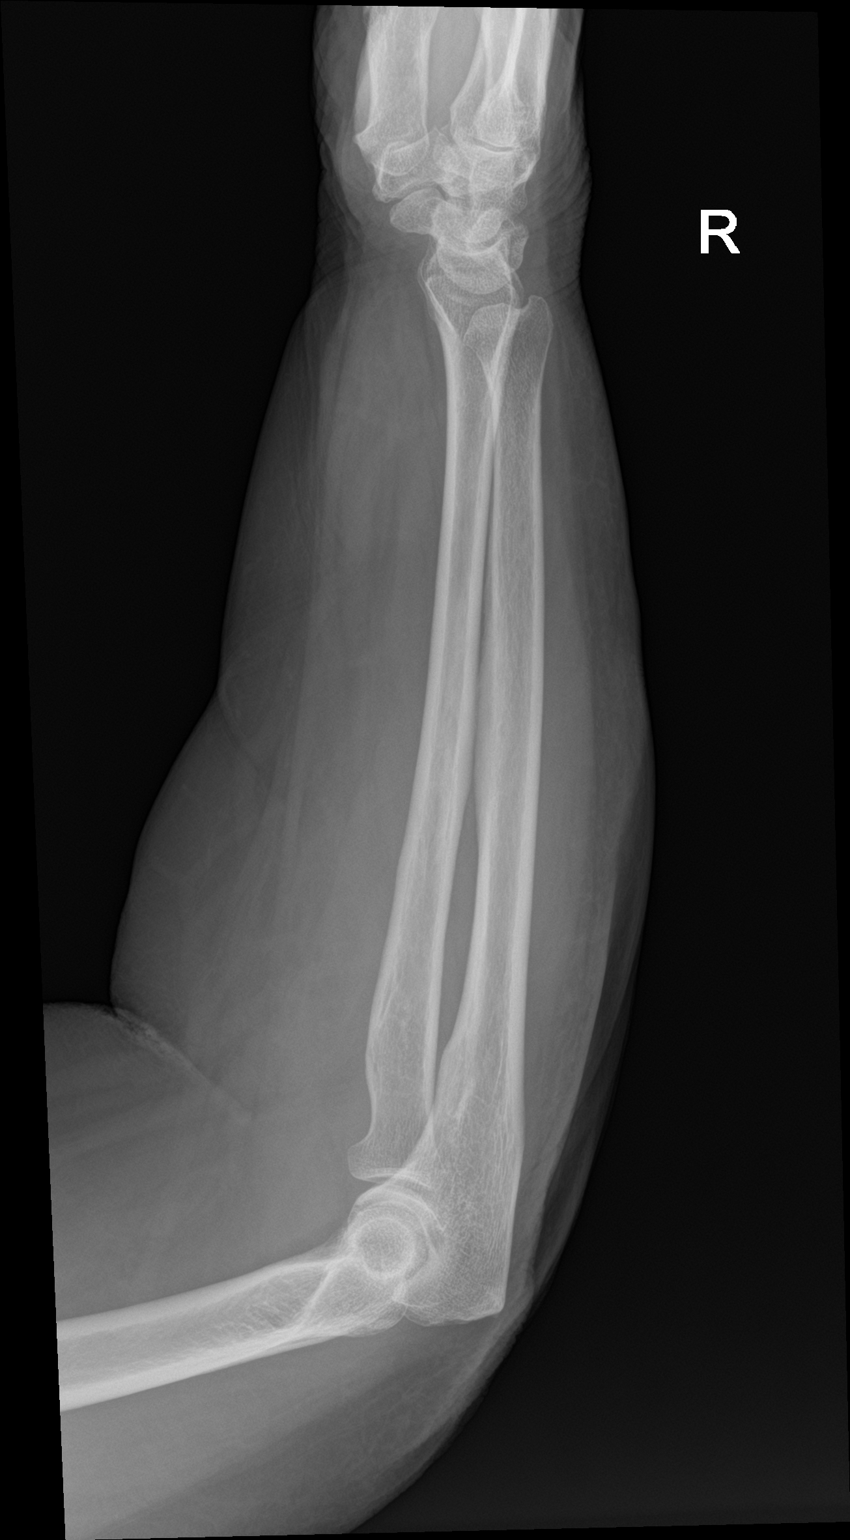

[2 of 2 positions shown; findings below may reference images not displayed]

FINDINGS: Osseous mineralization normal.

Joint spaces preserved.

No fracture, dislocation, or bone destruction.
IMPRESSION: Normal exam.

## 2019-07-19 DIAGNOSIS — N1832 Chronic kidney disease, stage 3b: Secondary | ICD-10-CM | POA: Diagnosis not present

## 2019-07-19 DIAGNOSIS — I130 Hypertensive heart and chronic kidney disease with heart failure and stage 1 through stage 4 chronic kidney disease, or unspecified chronic kidney disease: Secondary | ICD-10-CM | POA: Diagnosis not present

## 2019-07-19 DIAGNOSIS — I5043 Acute on chronic combined systolic (congestive) and diastolic (congestive) heart failure: Secondary | ICD-10-CM | POA: Diagnosis not present

## 2019-07-19 DIAGNOSIS — E1122 Type 2 diabetes mellitus with diabetic chronic kidney disease: Secondary | ICD-10-CM | POA: Diagnosis not present

## 2019-07-19 DIAGNOSIS — M1030 Gout due to renal impairment, unspecified site: Secondary | ICD-10-CM | POA: Diagnosis not present

## 2019-07-19 DIAGNOSIS — D631 Anemia in chronic kidney disease: Secondary | ICD-10-CM | POA: Diagnosis not present

## 2019-07-27 ENCOUNTER — Telehealth: Payer: Self-pay | Admitting: Internal Medicine

## 2019-07-27 ENCOUNTER — Ambulatory Visit (INDEPENDENT_AMBULATORY_CARE_PROVIDER_SITE_OTHER): Payer: Medicare Other | Admitting: *Deleted

## 2019-07-27 DIAGNOSIS — I4891 Unspecified atrial fibrillation: Secondary | ICD-10-CM | POA: Diagnosis not present

## 2019-07-27 DIAGNOSIS — Z5181 Encounter for therapeutic drug level monitoring: Secondary | ICD-10-CM | POA: Diagnosis not present

## 2019-07-27 DIAGNOSIS — D631 Anemia in chronic kidney disease: Secondary | ICD-10-CM | POA: Diagnosis not present

## 2019-07-27 DIAGNOSIS — N1832 Chronic kidney disease, stage 3b: Secondary | ICD-10-CM | POA: Diagnosis not present

## 2019-07-27 DIAGNOSIS — I130 Hypertensive heart and chronic kidney disease with heart failure and stage 1 through stage 4 chronic kidney disease, or unspecified chronic kidney disease: Secondary | ICD-10-CM | POA: Diagnosis not present

## 2019-07-27 DIAGNOSIS — M1030 Gout due to renal impairment, unspecified site: Secondary | ICD-10-CM | POA: Diagnosis not present

## 2019-07-27 DIAGNOSIS — I5043 Acute on chronic combined systolic (congestive) and diastolic (congestive) heart failure: Secondary | ICD-10-CM | POA: Diagnosis not present

## 2019-07-27 DIAGNOSIS — E1122 Type 2 diabetes mellitus with diabetic chronic kidney disease: Secondary | ICD-10-CM | POA: Diagnosis not present

## 2019-07-27 LAB — POCT INR: INR: 1.9 — AB (ref 2.0–3.0)

## 2019-07-27 NOTE — Telephone Encounter (Signed)
Orders given to [facility].  See coumadin note.

## 2019-07-27 NOTE — Patient Instructions (Signed)
Take warfarin 1 1/2 tablets tonight then resume 1 tablet daily except 1/2 tablet on Sundays Recheck in 3 weeks in the office.  D/C from [facility] today Order given to [facility] .

## 2019-07-27 NOTE — Telephone Encounter (Signed)
INR 1.9  °

## 2019-07-28 ENCOUNTER — Other Ambulatory Visit: Payer: Self-pay | Admitting: Internal Medicine

## 2019-07-28 DIAGNOSIS — J441 Chronic obstructive pulmonary disease with (acute) exacerbation: Secondary | ICD-10-CM | POA: Diagnosis not present

## 2019-07-29 ENCOUNTER — Telehealth: Payer: Self-pay | Admitting: Internal Medicine

## 2019-07-29 NOTE — Telephone Encounter (Signed)
She can take the 5 mg metolazone for 3 days, then go back to alternating 2.5/5 mg QOD dosing with same lasix dose.  Dr Lemmie Evens

## 2019-07-29 NOTE — Telephone Encounter (Signed)
   Pt said that she is retaining fluid and wanted for [name] to call her back, she doesn't have enough information since she said she couldn't get up to do anything. She did weigh herself this morning and she's 203 lbs. She said she doesn't know what to do.  Please advice

## 2019-07-29 NOTE — Telephone Encounter (Addendum)
Called patient of [name] who reports she feels she is retaining fluid - mainly in her stomach, stating when she goes to bend over it "shuts her air off". She does not report LE swelling. She is compliant with furosemide & metolazone.   She reports it took an hour to sweep the floor - so she knows she is retaining fluid.   She reports gaining 1lb per day over the past few days - now is 203lbs.   She doesn't consume salt/sodium in her diet.   Advised will send a message to [name] to advise if OK to adjust diuretic - last labs Nov 2020, creatinine elevated then

## 2019-07-30 NOTE — Telephone Encounter (Signed)
Patient aware of MD instructions regarding diuretic

## 2019-08-03 ENCOUNTER — Telehealth: Payer: Self-pay | Admitting: Internal Medicine

## 2019-08-03 NOTE — Telephone Encounter (Signed)
Pt c/o medication issue:  1. Name of Medication: modafinil (PROVIGIL) 200 MG tablet  2. How are you currently taking this medication (dosage and times per day)? 1 tablet (200 mg total) by mouth daily  3. Are you having a reaction (difficulty breathing--STAT)? No  4. What is your medication issue? Patient states the medication is causing her to experience some SOB.   Pt c/o Shortness Of Breath: STAT if SOB developed within the last 24 hours or pt is noticeably SOB on the phone  1. Are you currently SOB (can you hear that pt is SOB on the phone)? No  2. How long have you been experiencing SOB? 6 months  3. Are you SOB when sitting or when up moving around? When up and moving around  4. Are you currently experiencing any other symptoms? No

## 2019-08-03 NOTE — Telephone Encounter (Signed)
Spoke with pt, she has taken the provigil for 6 months and has not been able to reach dr sood's office to get a refill. She reports no heart failure symptoms at this time. She requested her appointment on 08-19-19 with dr hilty be changed to virtual due to transportation issues. sopke with dr sood's office, they will call and schedule a follow up appointment with the patient for medication refills, she has not seen them since 2019.

## 2019-08-04 ENCOUNTER — Other Ambulatory Visit: Payer: Self-pay | Admitting: *Deleted

## 2019-08-04 ENCOUNTER — Other Ambulatory Visit: Payer: Self-pay | Admitting: Internal Medicine

## 2019-08-04 NOTE — Patient Outreach (Signed)
[facility]) Care Management  08/04/2019  Jacari Kirsten [facility] 11-Nov-1950 [id number]  CSW received an In [employer], via [place], from [place] [facility], Care Management Coordinator, also with [place] Management, indicating that patient had called the office reporting that she received "some paperwork, not knowing what to fill out or where to sign the documents".  CSW was able to make contact with patient today to follow-up regarding her request for a return call, as well as answer additional questions regarding completion of her Advanced Directives.  CSW reminded patient of the extensive conversation that CSW had with her on Monday, June 14, 2019, in which CSW assisted patient with completing her Living Will and [place] documents over the phone.    Once completed, CSW explained to patient that she will need to take the documents to a [place] affiliated facility, provided to her in the packet, to have her signature witnessed and the documents notarized.  Patient was then instructed to keep the original copy for herself, provide a copy to her designated power of attorney(s) and provide a copy to her Primary Care Physician, Dr. [name] to scan into her EMR (Electronic Medical Record) in Epic.  Patient was also encouraged to take the Out-of-Facility DNR (Do Not Resuscitate) Form with her to her next scheduled appointment with Dr. Nevada Crane, for review and completion.  Patient voiced understanding and was agreeable to this plan.  No additional social work needs identified.  Nat Christen, BSW, MSW, LCSW  Licensed Education officer, environmental Health System  Mailing [place] [address], [place], [place] [address] Physical Address-[address], [place], [place] [address] Toll Free Main # [phone] Fax # [phone] Cell # [phone]  Office #  [phone] [name]

## 2019-08-11 ENCOUNTER — Other Ambulatory Visit: Payer: Self-pay | Admitting: *Deleted

## 2019-08-11 NOTE — Patient Outreach (Signed)
[facility]) Care Management  08/11/2019  Yuna Pizzolato [facility] 30-Dec-1950 [id number]   CSW received an In [employer], via [place], from Ina Homes, Care Management Assistant with [facility], on Tuesday, August 10, 2019, indicating that patient had called the main office requesting that Uriah return her call, as she apparently has additional questions regarding her Advanced Directives ([place] documents).  CSW made attempts to try and contact patient last evening, and then again this morning, without success.  CSW left two HIPAA compliant messages on voicemail for patient, speaking slowly and concisely, providing patient with CSW's contact information twice, encouraging patient to return CSW's call at her earliest convenience.  CSW continues to await a return call.  Nat Christen, BSW, MSW, LCSW  Licensed Education officer, environmental Health System  Mailing [place] [address] [place], [place] [address] Physical Address-[address], [place], [place] [address] Toll Free Main # [phone] Fax # [phone] Cell # [phone]  Office # [phone] [name]

## 2019-08-12 ENCOUNTER — Ambulatory Visit: Payer: Self-pay | Admitting: *Deleted

## 2019-08-12 ENCOUNTER — Telehealth: Payer: Self-pay | Admitting: Pulmonary Disease

## 2019-08-12 ENCOUNTER — Other Ambulatory Visit: Payer: Self-pay | Admitting: Pulmonary Disease

## 2019-08-12 NOTE — Telephone Encounter (Signed)
Pt requesting refill on Modafinil 200 mg Last ov 07/07/18 Pt states she has been out for 3 mos. Left message for patient that appt needed.

## 2019-08-13 ENCOUNTER — Ambulatory Visit: Payer: Medicare Other | Admitting: *Deleted

## 2019-08-13 ENCOUNTER — Other Ambulatory Visit: Payer: Self-pay | Admitting: *Deleted

## 2019-08-13 NOTE — Patient Outreach (Signed)
Denise Freeman) Care Management  08/13/2019  Denise Freeman Denise Freeman 03/19/1951 [id number]   Subjective: Telephone call to patient's home number, spoke with patient, and HIPAA verified.  Patient states she is doing okay and has a lot going on right now.  States her brother is currently staying with her and husband is having some  mobility issue due to a injury.  States she has a follow up visit with Coumadin clinic on 08/18/2019.  Patient states she is sorry that she missed the [place] Engineer, building services [place]) call, did not recognize the phone number, has been not answering calls from numbers that they do not recognize,  and will try to call her back on 08/16/2019.  RNCM advised will also update  [name] via email.   Patient states she does not have any education material, transition of care, care coordination, transportation, community resource, or pharmacy needs at this time.  States she is very appreciative of the follow up and is in agreement to continue to receive [place] Management services.    Objective:Per KPN (Knowledge Performance Now, point of care tool) and chart review,patient hospitalized 04/27/2019 - 05/02/2019 forAcute on chronic combined systolic and diastolic CHF (congestive heart failure). Patient hospitalized 01/22/2019 - 01/25/2019 for Atrial fibrillation,cute on chronic combined systolic and diastolic CHF (congestive heart failure). 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 Patient closed to [place] Management services on  02/10/2019 due to unable to contact.   Assessment: Received [facility] referral on 05/03/2019. Referral source: Sharley Hunnicutt. Referral reason:  Reason for consult ->disease and medication managment      Diagnoses of -> Heart Failure  Transition of carefollow up completed. Willcontinue tofollow upmonthly or bimonthly, for congestive heart failure disease monitoring and education.Patient has been referredto [facility] Management Social Worker for caregiver resources, housekeeping resources, visual aid/ support services resources,recliner chair community resources,Advanced Directive packet, and follow up on document completion.  Plan: [facility] CM Care Plan Problem One     Most Recent Value  Care Plan Problem One  Patient hospitalized 04/27/2019 - 05/02/2019 for acute on chronic combined systolic diastolic congestive heart failure  Role Documenting the Problem One  Care Management Telephonic Coordinator  Care Plan for Problem One  Active  THN Long Term Goal   Patient will verablize no hospitalization for any reason over the next 31 days.  THN Long Term Goal Start Date  05/10/19  Interventions for Problem One Long Term Goal  RNCM encouraged patient to follow activity restrictions as needed.   THN CM Short Term Goal #1   In the next 30 days patient will verbalized that she has attended all hospital MD follow up appointments.  THN CM Short Term Goal #1 Start Date  05/10/19  Interventions for Short Term Goal #1  RNCM verified patient has attended all MD appointments since last patient outreach.   THN CM Short Term Goal #2   In the next 30 days patient will verbalize that is receiving home health services and actively participating with visits.  THN CM Short Term Goal #2 Start Date  05/10/19  [facility] CM Short Term Goal #2 Met Date  07/02/19      RNCM will call patient for telephone outreach attempt within90business days,congestive heart failure disease management/  monitoring / education.     Endya Austin H. Annia Friendly, BSN, [place] Management [facility] Telephonic CM Phone: [phone] Fax: [phone]

## 2019-08-13 NOTE — Telephone Encounter (Signed)
ATC pt, no answer. Left message for pt to call back.  

## 2019-08-16 NOTE — Telephone Encounter (Signed)
LMTCB x3 for pt. We have attempted to contact pt several times with no success or call back from pt. Per triage protocol, message will be closed.   

## 2019-08-19 ENCOUNTER — Telehealth (INDEPENDENT_AMBULATORY_CARE_PROVIDER_SITE_OTHER): Payer: Medicare Other | Admitting: Internal Medicine

## 2019-08-19 ENCOUNTER — Encounter: Payer: Self-pay | Admitting: Internal Medicine

## 2019-08-19 VITALS — BP 128/69 | HR 83 | Wt 202.0 lb

## 2019-08-19 DIAGNOSIS — I4821 Permanent atrial fibrillation: Secondary | ICD-10-CM

## 2019-08-19 DIAGNOSIS — Z6841 Body Mass Index (BMI) 40.0 and over, adult: Secondary | ICD-10-CM

## 2019-08-19 DIAGNOSIS — E785 Hyperlipidemia, unspecified: Secondary | ICD-10-CM

## 2019-08-19 DIAGNOSIS — Z66 Do not resuscitate: Secondary | ICD-10-CM

## 2019-08-19 DIAGNOSIS — Z8679 Personal history of other diseases of the circulatory system: Secondary | ICD-10-CM

## 2019-08-19 DIAGNOSIS — J449 Chronic obstructive pulmonary disease, unspecified: Secondary | ICD-10-CM | POA: Diagnosis not present

## 2019-08-19 DIAGNOSIS — Z9911 Dependence on respirator [ventilator] status: Secondary | ICD-10-CM

## 2019-08-19 DIAGNOSIS — F319 Bipolar disorder, unspecified: Secondary | ICD-10-CM

## 2019-08-19 DIAGNOSIS — I5023 Acute on chronic systolic (congestive) heart failure: Secondary | ICD-10-CM

## 2019-08-19 DIAGNOSIS — Z9981 Dependence on supplemental oxygen: Secondary | ICD-10-CM

## 2019-08-19 DIAGNOSIS — M797 Fibromyalgia: Secondary | ICD-10-CM

## 2019-08-19 DIAGNOSIS — I110 Hypertensive heart disease with heart failure: Secondary | ICD-10-CM

## 2019-08-19 DIAGNOSIS — I5043 Acute on chronic combined systolic (congestive) and diastolic (congestive) heart failure: Secondary | ICD-10-CM | POA: Diagnosis not present

## 2019-08-19 DIAGNOSIS — G4733 Obstructive sleep apnea (adult) (pediatric): Secondary | ICD-10-CM | POA: Diagnosis not present

## 2019-08-19 DIAGNOSIS — N183 Chronic kidney disease, stage 3 unspecified: Secondary | ICD-10-CM

## 2019-08-19 NOTE — Patient Instructions (Addendum)
  Medication Instructions:  Continue current medications You can take additional metolazone 2.5mg  on the days you take the lower dose (for a total of 5mg ) if you need to for swelling/weight gain   *If you need a refill on your cardiac medications before your next appointment, please call your pharmacy*  Follow-Up: At [facility], you and your health needs are our priority.  As part of our continuing mission to provide you with exceptional heart care, we have created designated Provider Care Teams.  These Care Teams include your primary Cardiologist (physician) and Advanced Practice Providers (APPs -  Physician Assistants and Nurse Practitioners) who all work together to provide you with the care you need, when you need it.  We recommend signing up for the patient portal called "MyChart".  Sign up information is provided on this After Visit Summary.  MyChart is used to connect with patients for Virtual Visits (Telemedicine).  Patients are able to view lab/test results, encounter notes, upcoming appointments, etc.  Non-urgent messages can be sent to your provider as well.   To learn more about what you can do with MyChart, go to [email or web address].    Your next appointment:   6 month(s)  The format for your next appointment:   In Person  Provider:   You may see Pixie Casino, MD or one of the following Advanced Practice Providers on your designated Care Team:    Almyra Deforest, PA-C  Fabian Sharp, PA-C or   Roby Lofts, [place]    Other Instructions

## 2019-08-19 NOTE — Progress Notes (Signed)
Virtual Visit via Telephone Note   This visit type was conducted due to national recommendations for restrictions regarding the COVID-19 Pandemic (e.g. social distancing) in an effort to limit this patient's exposure and mitigate transmission in our community.  Due to her co-morbid illnesses, this patient is at least at moderate risk for complications without adequate follow up.  This format is felt to be most appropriate for this patient at this time.  The patient did not have access to video technology/had technical difficulties with video requiring transitioning to audio format only (telephone).  All issues noted in this document were discussed and addressed.  No physical exam could be performed with this format.  Please refer to the patient's chart for her  consent to telehealth for [facility].   Evaluation Performed:  Telephone follow-up  Date:  08/19/2019   ID:  Denise Freeman, DOB [birth year], MRN [id number]  Patient Location:  [place] [address]  Provider location:   [address] [place], [place] [address]  PCP:  Celene Squibb, MD  Cardiologist:  Pixie Casino, MD Electrophysiologist:  None   Chief Complaint:  Palpitations, swelling  History of Present Illness:    Denise Freeman is a 69 y.o. female who presents via audio/video conferencing for a telehealth visit today.  Refill is seen today via telephone visit for symptoms of left-sided chest pain and palpitations.  Recently she has been struggling with recurrent skin infections.  She was placed on steroids and clindamycin.  She does report some improvement in her skin blisters, but has had some palpitations and tachycardia.  She has permanent A. fib and her rate appears to be well controlled today.  With regards her chest pain, this is left-sided, sharp and intermittent, worse somewhat with movement but not necessarily exertion.  She has no known coronary disease and has had multiple cardiac  catheterizations over the past 20 years demonstrated no significant obstructive coronary disease.  She has a nonischemic cardiomyopathy with EF 30 to 35% and is on high-dose diuretics, Lasix 80 mg 3 times daily and metolazone 2.5 mg daily.  She does appear to be euvolemic on this dose in fact her weight is actually 4 pounds lighter than it was in February.  10/14/2018  Denise Freeman is seen today for follow-up.  She reports that overall she is feeling better.  Her chest pain has improved.  She is now off of steroids.  Her heart rate is improved as well.  She was on treatment for gout however she said she had some difficulty with capsules rather than tablets for colchicine.  She remains on an antibiotic.  Her weight is been stable.  Her blood pressure still remains elevated despite restarting her losartan 12.5 mg daily.  02/03/2019  Denise Freeman is seen today for hospital follow-up via telephone visit.  She was recently admitted to [facility] on August 21 and discharged on August 24.  This was for acute on chronic congestive heart failure complicated by COPD and chronic kidney disease.  She was noted to be volume overloaded and diuresed.  She responded very well to IV Lasix and diuresed fairly quickly.  Her case was discussed with Dr. Bronson Ing, who recommended changing her Lasix to torsemide 60 mg twice daily and increasing metolazone from 2.5 to 5 mg daily.  Since then she has been fairly stable.  Weight does appear to be up about 3 pounds since discharge.  She has a home  health nurse who has been following with her and says that she feels that she has no signs of volume overload.  Ms. shorb says that her breathing is back to her baseline.  08/19/2019  Denise Freeman is seen today via telephone visit.  She reports continuing tachypalpitations and has known permanent A. fib.  I suspect this might be related to use of nebulizers or hypoxia.  She lost power in heat for several days recently and felt  significantly worse after that.  She is also had some worsening swelling.  She is using alternating metolazone 2.5 and 5 mg every other day.  Weight has come up some.  The patient does not have symptoms concerning for COVID-19 infection (fever, chills, cough, or new SHORTNESS OF BREATH).    Prior CV studies:   The following studies were reviewed today:  Labs Chart review of recent hospitalization  PMHx:  Past Medical History:  Diagnosis Date  . Allergy   . Anemia   . Anxiety   . Asthma   . Atrial fibrillation ([place])   . Bipolar 1 disorder ([place])   . 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 Cholelithiasis    a. 09/2016 s/p Lap Chole.  . Chronic bronchitis ([place])   . Chronic combined systolic and diastolic congestive heart failure ([place])    a. 09/2016 Echo: EF 30-35%.  . Colon polyps   . COPD (chronic obstructive pulmonary disease) ([place])   . Depression   . Family history of adverse reaction to anesthesia    Uncle was positive for malignant hyperthermia; patient had testing done and was negative.  . Fibromyalgia   . GERD (gastroesophageal reflux disease)   . Gout   . High cholesterol   . History of blood transfusion 06/2015   "bleeding from my rectum"  . History of hiatal hernia   . HOH (hard of hearing)   . Hx of colonic polyps 03/21/2016   3 small adenomas no recall - co-morbidities  . Neuropathy    Disc Back   . NICM (nonischemic cardiomyopathy) ([place])    a. Previously worked up in [place], [place]-->low EF with subsequent recovery.  Multiple caths (last ~ 2014 per pt report)--reportedly nl cors;  b. 09/2016 Echo: EF 30-35%, antsept/apical HK, mild MR, mildly dil LA, mod dil RA;  c. 09/2016 Lexi MV: EF 26%, glob HK, sept DK, med size, mod intensity fixed septal defect - BBB/PVC related artifact, no ischemia.  . On home oxygen therapy    "3L; 24/7" (03/14/2016)  . OSA treated with BiPAP    uses biPAP, 10 (03/14/2016)  . Osteoarthritis   . Oxygen deficiency   .  Pneumonia   . Type II diabetes mellitus ([place])     Past Surgical History:  Procedure Laterality Date  . APPENDECTOMY     "they busted"  . bladder stimulator     pt states, "it cannot be turned off; it's in my right hip; dead battery so it's not working anymore". (03/14/2016)  . BLADDER SUSPENSION     2003, 2006 and 2010  . CATARACT EXTRACTION W/PHACO Right 11/29/2014   Procedure: CATARACT EXTRACTION PHACO AND INTRAOCULAR LENS PLACEMENT (IOC);  Surgeon: Rutherford Guys, MD;  Location: [facility];  Service: Ophthalmology;  Laterality: Right;  CDE:3.81  . CATARACT EXTRACTION W/PHACO Left 12/13/2014   Procedure: CATARACT EXTRACTION PHACO AND INTRAOCULAR LENS PLACEMENT (IOC);  Surgeon: Rutherford Guys, MD;  Location: [facility];  Service: Ophthalmology;  Laterality: Left;  CDE:6.59  .  CERVICAL DISC SURGERY N/A 2009   4, 6, and 7 cervical disc replaced  . CHOLECYSTECTOMY N/A 09/13/2016   Procedure: LAPAROSCOPIC CHOLECYSTECTOMY;  Surgeon: Rolm Bookbinder, MD;  Location: [place];  Service: General;  Laterality: N/A;  . COLONOSCOPY WITH PROPOFOL N/A 03/15/2016   Procedure: COLONOSCOPY WITH PROPOFOL;  Surgeon: Gatha Mayer, MD;  Location: [place];  Service: Endoscopy;  Laterality: N/A;  . ESOPHAGOGASTRODUODENOSCOPY (EGD) WITH PROPOFOL N/A 03/15/2016   Procedure: ESOPHAGOGASTRODUODENOSCOPY (EGD) WITH PROPOFOL;  Surgeon: Gatha Mayer, MD;  Location: [place];  Service: Endoscopy;  Laterality: N/A;  . HEEL SPUR SURGERY Bilateral   . HERNIA REPAIR    . I & D EXTREMITY Right 06/13/2015   Procedure: MINOR IRRIGATION AND DEBRIDEMENT EXTREMITY REMOVAL OF NAIL;  Surgeon: Daryll Brod, MD;  Location: [place];  Service: Orthopedics;  Laterality: Right;  . TUBAL LIGATION    . UMBILICAL HERNIA REPAIR     w/mesh    FAMHx:  Family History  Problem Relation Age of Onset  . Heart disease Mother   . COPD Mother   . Diabetes Mother   . Breast cancer Mother   . Heart disease Father   .  Hyperlipidemia Father   . COPD Sister   . Heart disease Sister   . Diabetes Sister   . Heart disease Maternal Grandmother   . Cancer Maternal Grandmother        stomach  . Cancer Maternal Grandfather        lung   . Bipolar disorder Brother     SOCHx:   reports that she quit smoking about 30 years ago. Her smoking use included cigarettes. She has a 28.00 pack-year smoking history. She has never used smokeless tobacco. She reports that she does not drink alcohol or use drugs.  ALLERGIES:  Allergies  Allergen Reactions  . Xarelto [Rivaroxaban] Other (See Comments)    Internal bleeding  . Ancef [Cefazolin] Nausea And Vomiting  . Levaquin [Levofloxacin In D5w] Other (See Comments)    "afib"  . Lyrica [Pregabalin] Hives  . Tamiflu [Oseltamivir Phosphate] Other (See Comments)    "water blisters"  . Zoloft [Sertraline Hcl] Other (See Comments)    Jaw problems, jittery  . Torsemide Other (See Comments)    Not effective  . Augmentin [Amoxicillin-Pot Clavulanate] Itching  . Ciprofloxacin Itching and Nausea And Vomiting  . Haldol [Haloperidol] Other (See Comments)    Restless leg  . Nsaids Diarrhea  . Penicillins Itching, Nausea And Vomiting and Rash    Has patient had a PCN reaction causing immediate rash, facial/tongue/throat swelling, SOB or lightheadedness with hypotension: Yes Has patient had a PCN reaction causing severe rash involving mucus membranes or skin necrosis: No Has patient had a PCN reaction that required hospitalization No Has patient had a PCN reaction occurring within the last 10 years: Yes If all of the above answers are "NO", then may proceed with Cephalosporin use.  . Topamax [Topiramate] Nausea Only    MEDS:  Current Meds  Medication Sig  . albuterol (PROVENTIL) (2.5 MG/3ML) 0.083% nebulizer solution USE (1) IN NEBULIZER EVERY 6 HOURS AS NEEDED FOR SHORTNESS OF BREATH.  Marland Kitchen allopurinol (ZYLOPRIM) 100 MG tablet TAKE 1 TABLET BY MOUTH ONCE DAILY.  .  budesonide (PULMICORT) 0.5 MG/2ML nebulizer solution Take 2 mLs (0.5 mg total) by nebulization 2 (two) times daily.  . cetirizine (ZYRTEC) 10 MG tablet Take 1 tablet (10 mg total) by mouth at bedtime.  . cholecalciferol (VITAMIN D)  400 units TABS tablet Take 1 tablet (400 Units total) by mouth 2 (two) times daily.  . clobetasol cream (TEMOVATE) 0.05 %   . colchicine 0.6 MG tablet Take 1 tablet (0.6 mg total) by mouth as needed.  Marland Kitchen escitalopram (LEXAPRO) 10 MG tablet Take 1 tablet (10 mg total) by mouth at bedtime.  . FEROSUL 325 (65 Fe) MG tablet Take 1 tablet by mouth daily.  . fluticasone (FLONASE) 50 MCG/ACT nasal spray Place 1 spray into both nostrils 2 (two) times daily.  . furosemide (LASIX) 40 MG tablet TAKE (2) TABLETS BY MOUTH THREE TIMES DAILY.  Marland Kitchen Insulin Glargine (BASAGLAR KWIKPEN) 100 UNIT/ML SOPN Inject 0.6 mLs (60 Units total) into the skin at bedtime.  . insulin lispro (HUMALOG KWIKPEN) 100 UNIT/ML KiwkPen Inject 0.1-0.16 mLs (10-16 Units total) into the skin 3 (three) times daily before meals.  . Insulin Pen Needle 32G X 4 MM MISC Use to inject insulin 5 times daily  . ipratropium (ATROVENT) 0.02 % nebulizer solution USE (1) IN NEBULIZER EVERY 6 HOURS AS NEEDED FOR SHORTNESS OF BREATH.  Marland Kitchen losartan (COZAAR) 25 MG tablet Take 0.5 tablets (12.5 mg total) by mouth daily.  . metolazone (ZAROXOLYN) 2.5 MG tablet TAKE (1) TABLET BY MOUTH EVERY OTHER DAY ALTERNATING WITH 5MG  TABLET.  Marland Kitchen metolazone (ZAROXOLYN) 5 MG tablet TAKE 1 TABLET BY MOUTH ONCE EVERY OTHER DAY ALTERNATING WITH 2.5MG  TAB.  Marland Kitchen metoprolol succinate (TOPROL-XL) 25 MG 24 hr tablet Take 1 tablet (25 mg total) by mouth 2 (two) times daily.  . mirtazapine (REMERON) 15 MG tablet Take 1 tablet (15 mg total) by mouth at bedtime.  . Misc Natural Products (TART CHERRY ADVANCED PO) Take by mouth daily.  . modafinil (PROVIGIL) 200 MG tablet Take 1 tablet (200 mg total) by mouth daily.  . mometasone (NASONEX) 50 MCG/ACT nasal spray  Place 1 spray into the nose daily.  . montelukast (SINGULAIR) 10 MG tablet TAKE ONE TABLET BY MOUTH AT BEDTIME.  . mupirocin cream (BACTROBAN) 2 % Apply 1 application topically 2 (two) times daily.  . nitroGLYCERIN (NITROSTAT) 0.4 MG SL tablet Place 0.4 mg under the tongue every 5 (five) minutes as needed for chest pain.  Marland Kitchen nystatin (NYSTATIN) powder Apply topically 2 (two) times daily. Apply under breasts  . ondansetron (ZOFRAN) 4 MG tablet Take 4 mg by mouth once.  Marland Kitchen oxyCODONE-acetaminophen (PERCOCET/ROXICET) 5-325 MG tablet Take 1 tablet by mouth 2 (two) times daily as needed for severe pain.  . pantoprazole (PROTONIX) 20 MG tablet TAKE (1) TABLET BY MOUTH TWICE A DAY BEFORE MEALS. (BREAKFAST AND SUPPER)  . potassium chloride SA (KLOR-CON) 20 MEQ tablet Take 20 mEq by mouth 2 (two) times daily.  . potassium chloride SA (KLOR-CON) 20 MEQ tablet Take 1 tablet (20 mEq total) by mouth 2 (two) times daily.  . primidone (MYSOLINE) 50 MG tablet TAKE ONE TABLET BY MOUTH AT BEDTIME.  . Probiotic Product (PROBIOTIC PO) Take 1 tablet by mouth at bedtime.  . triamcinolone lotion (KENALOG) 0.1 % Apply 1 application topically 3 (three) times daily. For up to 14 days and as needed  . TRUE METRIX BLOOD GLUCOSE TEST test strip USE TO TEST BLOOD SUGAR AS DIRECTED.  . Vitamin A 2400 MCG (8000 UT) CAPS Take 8,000 Units by mouth every morning.   . warfarin (COUMADIN) 4 MG tablet TAKE 1 TABLET BY MOUTH ONCE DAILY EXCEPT ON THURSDAY TAKE 1/2 TABLET     ROS: Pertinent items noted in HPI and remainder of  comprehensive ROS otherwise negative.  Labs/Other Tests and Data Reviewed:    Recent Labs: 01/24/2019: ALT 17 01/25/2019: Magnesium 1.8 04/27/2019: B Natriuretic Peptide 318.0 04/30/2019: Hemoglobin 11.1; Platelets 186 05/02/2019: BUN 54; Creatinine, Ser 1.09; Potassium 4.1; Sodium 145   Recent Lipid Panel Lab Results  Component Value Date/Time   CHOL 163 07/29/2017 11:15 AM   TRIG 183 (H) 07/29/2017 11:15  AM   HDL 40 07/29/2017 11:15 AM   CHOLHDL 4.1 07/29/2017 11:15 AM   CHOLHDL 6.2 09/10/2016 11:47 AM   LDLCALC 86 07/29/2017 11:15 AM   LDLDIRECT 153.0 04/19/2016 09:32 AM    Wt Readings from Last 3 Encounters:  08/19/19 202 lb (91.6 kg)  05/18/19 206 lb 9.6 oz (93.7 kg)  05/10/19 199 lb (90.3 kg)     Exam:    Vital Signs:  BP 128/69   Pulse 83   Wt 202 lb (91.6 kg)   BMI 40.80 kg/m    exam not performed due to telephone visit  ASSESSMENT & PLAN:    1. Acute on Chronic combined congestive heart failure - EF 30-35% 2. Permanent atrial fibrillation - not on anticoagulation due to recent GI bleeding and vision loss attributed to stroke 3. History of stress-induced cardiomyopathy 4. No significant obstructive coronary disease after multiple catheterizations in 1996, 99, 2007 and 2014. 5. Fibromyalgia 6. Bipolar 1 disorder 7. Dyslipidemia 8. Hypertension 9. Severe COPD on home oxygen 10. Obstructive sleep apnea on CPAP 11. Morbid obesity - with weight loss recently 12. DNR  Leslea reports some more swelling and may have some acute on chronic diastolic heart failure.  Interestingly her last echo in November 2020 showed an EF of 50 to 55%, this is increased significantly from a prior echo which showed an EF of 30% back in 2018.  Overall this is good prognostically however I think she will still require diuretics.  She is advised to take the metolazone 5 mg rather than 2.5 mg on alternating days or for a few days in a row if her swelling is significant or persists.  Then return to 5 alternating with 2.5 every other day.  Follow-up with me in 6 months.  COVID-19 Education: The signs and symptoms of COVID-19 were discussed with the patient and how to seek care for testing (follow up with PCP or arrange E-visit).  The importance of social distancing was discussed today.  Patient Risk:   After full review of this patients clinical status, I feel that they are at least moderate risk  at this time.  Time:   Today, I have spent 25 minutes with the patient with telehealth technology discussing sharp chest pain, palpitations, skin infection, antibiotics, steroid use.     Medication Adjustments/Labs and Tests Ordered: Current medicines are reviewed at length with the patient today.  Concerns regarding medicines are outlined above.   Tests Ordered: No orders of the defined types were placed in this encounter.   Medication Changes: No orders of the defined types were placed in this encounter.   Disposition:  in 6 month(s)  Pixie Casino, MD, [facility], [place] Director of the Advanced Lipid Disorders &  Cardiovascular Risk Reduction Clinic Diplomate of the American Board of Clinical Lipidology Attending Cardiologist  Direct Dial: [phone]  Fax: [phone]  Website:  [email or web address]  Pixie Casino, MD  08/19/2019 11:48 AM

## 2019-08-20 ENCOUNTER — Telehealth: Payer: Self-pay | Admitting: Pulmonary Disease

## 2019-08-20 NOTE — Telephone Encounter (Signed)
Dr. Halford Chessman, please advise if you are okay refilling med for pt. Pt was last seen 07/07/18. Pt has scheduled a televisit with Aaron Edelman 3/22. Preferred pharmacy is [facility] in [place].

## 2019-08-21 MED ORDER — MODAFINIL 200 MG PO TABS: 200.0000 mg | ORAL_TABLET | Freq: Every day | ORAL | 5 refills | Status: DC

## 2019-08-21 NOTE — Telephone Encounter (Signed)
Please let her know I sent refill for modafinil to her pharmacy.

## 2019-08-22 NOTE — Progress Notes (Signed)
Virtual Visit via Telephone Note  I connected with Evalyne L Bergey on 08/23/19 at  2:30 PM EDT by telephone and verified that I am speaking with the correct person using two identifiers.  Location: Patient: Home Provider: Office [facility] - [address], [place], [place], [place] [address]   I discussed the limitations, risks, security and privacy concerns of performing an evaluation and management service by telephone and the availability of in person appointments. I also discussed with the patient that there may be a patient responsible charge related to this service. The patient expressed understanding and agreed to proceed.  Patient consented to consult via telephone: Yes People present and their role in pt care: Pt     History of Present Illness:  69 year old female former smoker followed in our office for obstructive sleep apnea, chronic hypoxic hypercapnic respiratory failure  Past medical history: Chronic kidney disease, type 2 diabetes, dyslipidemia, heart failure, status post Chole, gout, chronic pain syndrome Smoking history: Former smoker.  Quit 1990.  28-pack-year smoking history Maintenance: Provigil 200mg   Patient of Dr. Halford Chessman  Chief complaint: Refill for Provigil    69 year old female former smoker followed in our office for obstructive sleep apnea and chronic hypoxic hypercapnic respiratory failure.  Patient completing televisit today to receive a refill of her Provigil.  This was already sent in on 08/21/2019 by Dr. Halford Chessman.  Patient reporting today that she has not received yet from her pharmacy.  She is unsure why.  When the pharmacy delivered medications earlier today she did not ask them.  Patient is very stressed over the phone.  She is unable to use my chart because she is legally blind.  She has difficulty answering the telephone because of multiple spam calls a day.  This makes it difficult to return and follow-up with our office due to missing telephone  calls.  Patient reporting she has no acute or additional complaints today she simply just wants a refill of her Provigil.  Observations/Objective:  Pulmonary tests:  PFT 10/24/14 >> FEV1 0.64 (30%), FEV1% 65, TLC 2.92 (64%), DLCO 54%, + BD  Sleep tests:  PSG 1991 Bipap 11/01/15 to 01/29/16 >>used on 89 of 90 nights with average 10 hrs 19 min. Average AHI 2.4 with Bipap 16/10 cm H2O  Cardiac tests:  Echo12/06/19 >> EF 40 to 45%, grade 3 DD, mild MR, PAS 36 mmHg   Social History   Tobacco Use  Smoking Status Former Smoker  . Packs/day: 2.00  . Years: 14.00  . Pack years: 28.00  . Types: Cigarettes  . Quit date: 04/12/1989  . Years since quitting: 30.3  Smokeless Tobacco Never Used   Immunization History  Administered Date(s) Administered  . Influenza Split 03/03/2014  . Influenza, High Dose Seasonal PF 02/18/2018  . Influenza,inj,Quad PF,6+ Mos 01/30/2016, 01/27/2017  . Influenza-Unspecified 02/06/2015  . Pneumococcal Conjugate-13 01/30/2016  . Pneumococcal Polysaccharide-23 11/24/2016  . Tetanus 06/03/2013      Assessment and Plan:  OSA (obstructive sleep apnea) Plan: Continue BiPAP Continue Provigil 31-month follow-up with Dr. Halford Chessman  Chronic hypoxic, hypercapnic respiratory failure Plan: Continue to maintain oxygen saturations above 88% 75-month follow-up with our office   Follow Up Instructions:  Return in about 3 months (around 11/23/2019), or if symptoms worsen or fail to improve, for Follow up with Dr. Halford Chessman.   I discussed the assessment and treatment plan with the patient. The patient was provided an opportunity to ask questions and all were answered. The patient agreed  with the plan and demonstrated an understanding of the instructions.   The patient was advised to call back or seek an in-person evaluation if the symptoms worsen or if the condition fails to improve as anticipated.  I provided 18 minutes of non-face-to-face time during this  encounter.   Lauraine Rinne, NP

## 2019-08-23 ENCOUNTER — Encounter: Payer: Self-pay | Admitting: Pulmonary Disease

## 2019-08-23 ENCOUNTER — Other Ambulatory Visit: Payer: Self-pay

## 2019-08-23 ENCOUNTER — Ambulatory Visit (INDEPENDENT_AMBULATORY_CARE_PROVIDER_SITE_OTHER): Payer: Medicare Other | Admitting: Pulmonary Disease

## 2019-08-23 DIAGNOSIS — J9611 Chronic respiratory failure with hypoxia: Secondary | ICD-10-CM

## 2019-08-23 DIAGNOSIS — G4733 Obstructive sleep apnea (adult) (pediatric): Secondary | ICD-10-CM | POA: Diagnosis not present

## 2019-08-23 NOTE — Telephone Encounter (Signed)
Called and spoke with Patient.  Patient made aware Dr. Halford Chessman has sent prescription to pharmacy.  Understanding stated.  Nothing further at this time.

## 2019-08-23 NOTE — Assessment & Plan Note (Addendum)
Plan: Continue BiPAP Continue Provigil 70-month follow-up with Dr. Halford Chessman

## 2019-08-23 NOTE — Patient Instructions (Addendum)
You were seen today by Lauraine Rinne, NP  for:   It was nice talking to you today over the phone.  Please contact your pharmacy regarding your Provigil.  This was sent in by Dr. Halford Chessman on 08/21/2019.  Please contact our office back if you have additional concerns or issues.  We will see back in office in 3 months.  Wyn Quaker FNP   1. OSA (obstructive sleep apnea) 2. Chronic respiratory failure with hypoxia (HCC)  Follow Up:    Return in about 3 months (around 11/23/2019), or if symptoms worsen or fail to improve, for Follow up with Dr. Halford Chessman.   Please do your part to reduce the spread of COVID-19:      Reduce your risk of any infection  and COVID19 by using the similar precautions used for avoiding the common cold or flu:  Marland Kitchen Wash your hands often with soap and warm water for at least 20 seconds.  If soap and water are not readily available, use an alcohol-based hand sanitizer with at least 60% alcohol.  . If coughing or sneezing, cover your mouth and nose by coughing or sneezing into the elbow areas of your shirt or coat, into a tissue or into your sleeve (not your hands). Langley Gauss A MASK when in public  . Avoid shaking hands with others and consider head nods or verbal greetings only. . Avoid touching your eyes, nose, or mouth with unwashed hands.  . Avoid close contact with people who are sick. . Avoid places or events with large numbers of people in one location, like concerts or sporting events. . If you have some symptoms but not all symptoms, continue to monitor at home and seek medical attention if your symptoms worsen. . If you are having a medical emergency, call 911.   [place] / e-Visit: [email or web address]         [facility]: [facility]: [phone]                   [facility]: [phone]     It is flu  season:   >>> Best ways to protect herself from the flu: Receive the yearly flu vaccine, practice good hand hygiene washing with soap and also using hand sanitizer when available, eat a nutritious meals, get adequate rest, hydrate appropriately   Please contact the office if your symptoms worsen or you have concerns that you are not improving.   Thank you for choosing [facility] for your healthcare, and for allowing [place] to partner with you on your healthcare journey. I am thankful to be able to provide care to you today.   Wyn Quaker FNP-C

## 2019-08-23 NOTE — Assessment & Plan Note (Signed)
Plan: Continue to maintain oxygen saturations above 88% 45-month follow-up with our office

## 2019-08-24 NOTE — Progress Notes (Signed)
Reviewed and agree with assessment/plan.   Deberah Adolf, MD [facility] 05/29/2016, 12:24 PM Pager:  [phone]  

## 2019-08-26 ENCOUNTER — Other Ambulatory Visit: Payer: Self-pay | Admitting: Internal Medicine

## 2019-09-01 ENCOUNTER — Telehealth: Payer: Self-pay | Admitting: Pulmonary Disease

## 2019-09-01 NOTE — Telephone Encounter (Signed)
Spoke with [employer] and they are aware of approval. Nothing further needed.

## 2019-09-01 NOTE — Telephone Encounter (Signed)
Received a PA request for patient's modafinil 200mg . Attempted PA via Covermymeds. PA was unable to be completed via Covermymeds. Printed and completed the PA request form from [place]. Will fax to [place].   Will update once [facility] has made a determination.

## 2019-09-01 NOTE — Telephone Encounter (Signed)
Medication name and strength: modafinil 200mg  PA approved/denied: approved Approval dates: 06/03/2019-08/31/2020

## 2019-09-23 ENCOUNTER — Other Ambulatory Visit: Payer: Self-pay | Admitting: Internal Medicine

## 2019-09-23 NOTE — Telephone Encounter (Signed)
Rx(s) sent to pharmacy electronically.  

## 2019-09-24 DIAGNOSIS — J441 Chronic obstructive pulmonary disease with (acute) exacerbation: Secondary | ICD-10-CM | POA: Diagnosis not present

## 2019-10-08 DIAGNOSIS — R590 Localized enlarged lymph nodes: Secondary | ICD-10-CM | POA: Diagnosis not present

## 2019-10-13 DIAGNOSIS — E873 Alkalosis: Secondary | ICD-10-CM | POA: Diagnosis not present

## 2019-10-13 DIAGNOSIS — E559 Vitamin D deficiency, unspecified: Secondary | ICD-10-CM | POA: Diagnosis not present

## 2019-10-13 DIAGNOSIS — I129 Hypertensive chronic kidney disease with stage 1 through stage 4 chronic kidney disease, or unspecified chronic kidney disease: Secondary | ICD-10-CM | POA: Diagnosis not present

## 2019-10-13 DIAGNOSIS — N1831 Chronic kidney disease, stage 3a: Secondary | ICD-10-CM | POA: Diagnosis not present

## 2019-10-14 ENCOUNTER — Other Ambulatory Visit (HOSPITAL_COMMUNITY): Payer: Self-pay | Admitting: Adult Health Nurse Practitioner

## 2019-10-14 ENCOUNTER — Other Ambulatory Visit: Payer: Self-pay | Admitting: Adult Health Nurse Practitioner

## 2019-10-14 DIAGNOSIS — R102 Pelvic and perineal pain unspecified side: Secondary | ICD-10-CM

## 2019-10-14 DIAGNOSIS — R590 Localized enlarged lymph nodes: Secondary | ICD-10-CM

## 2019-10-14 DIAGNOSIS — R599 Enlarged lymph nodes, unspecified: Secondary | ICD-10-CM

## 2019-10-15 ENCOUNTER — Other Ambulatory Visit (HOSPITAL_COMMUNITY): Payer: Self-pay | Admitting: Adult Health Nurse Practitioner

## 2019-10-15 DIAGNOSIS — R059 Cough, unspecified: Secondary | ICD-10-CM

## 2019-10-22 ENCOUNTER — Ambulatory Visit (HOSPITAL_COMMUNITY): Admission: RE | Admit: 2019-10-22 | Payer: Medicare Other | Source: Ambulatory Visit

## 2019-10-26 ENCOUNTER — Telehealth: Payer: Self-pay | Admitting: Internal Medicine

## 2019-10-26 NOTE — Telephone Encounter (Signed)
May have to increase metolazone to 5 mg daily for 1 week or until weight goes down, then can go back to alternating 2.5/5 mg daily.  Dr Lemmie Evens

## 2019-10-26 NOTE — Telephone Encounter (Signed)
Pt called to report that she has some added fluid in her abdominal area.. she says is it is not bad but enough to make her uncomfortable. I could hear her wheeze in the phone but she says it is not new and her breathing is not any worse than it has been. It has been increasing over the past few days.   She says he lower extremities are "not bad".   She says he has not been eating much just salads but notices she is more distended than usual and feels it is the fluid settling there.   She is taking all of her meds and wtching her NA intake.   I will forward to [name] for review and recommendations.

## 2019-10-26 NOTE — Telephone Encounter (Signed)
Pt c/o swelling: STAT is pt has developed SOB within 24 hours  1) How much weight have you gained and in what time span? 6 lbs within 2 weeks  2) If swelling, where is the swelling located? stomach  3) Are you currently taking a fluid pill? yes  4) Are you currently SOB? no  5) Do you have a log of your daily weights (if so, list)? 4 days ago 181 lbs, 183 lbs, 187 lbs, yesterday 189 lbs  6) Have you gained 3 pounds in a day or 5 pounds in a week? yes  7) Have you traveled recently? No  Patient states she is retaining fluid and feels bloated. She states she is having swelling in her stomach and is SOB when walking. She states she has been having the SOB for a week. She also states she has not been eating that much.

## 2019-10-27 NOTE — Telephone Encounter (Signed)
I spoke to the patient and gave her Dr [facility] recommendation.  She will continue Metolazone 5 mg daily until next Monday.  She will call us on Tuesday with an update on her symptoms/weight status.

## 2019-10-27 NOTE — Telephone Encounter (Signed)
LMTCB regarding the following recommendations from [name]:   May have to increase metolazone to 5 mg daily for 1 week or until weight goes down, then can go back to alternating 2.5/5 mg daily.  Dr Lemmie Evens

## 2019-10-28 ENCOUNTER — Ambulatory Visit (HOSPITAL_COMMUNITY): Payer: Medicare Other

## 2019-11-05 NOTE — Telephone Encounter (Signed)
Spoke with patient. Informed her of Dr. Lysbeth Penner recommendations. Patient verbalized understanding.

## 2019-11-05 NOTE — Telephone Encounter (Signed)
Spoke with patient. Patient calling back about weight gain. She reports her abdomen has gone down some but she is still at 5lbs over with fluid. She has not checked her weight daily and thinks it was 187lbs last time she checked. She has edema in her legs and feet. She has been taking 5mg  of Metolazone daily since 10/27/2019. She has also been taking her lasix. She cannot come into the office today but has been given an appointment Monday morning at 9:15am.   Patient continues to have shortness of breath which she reports is about her usual. She is on home oxygen. She is able to carry on a conversation on the phone.   Patient reports she is elevating her lower extremities when sitting.   Will route to MD for review.

## 2019-11-05 NOTE — Telephone Encounter (Signed)
Pt c/o swelling: STAT is pt has developed SOB within 24 hours  1) How much weight have you gained and in what time span? No  2) If swelling, where is the swelling located? Legs and feet   3) Are you currently taking a fluid pill? Yes   4) Are you currently SOB? Yes, but not due to fluid pt is on oxygen   5) Do you have a log of your daily weights (if so, list)? No   6) Have you gained 3 pounds in a day or 5 pounds in a week? No   7) Have you traveled recently? No   Denise Freeman is calling back per Denise Freeman's request with an update on Denise Freeman symptoms. She states she no longer has swelling in Denise Freeman stomach, but his had moved down to Denise Freeman legs and feet. She states she has seen progression, but feels she still needs an increase on medication in order to get the remaining fluid off. Denise Freeman did not have log of weights at the time of call. Please advise.

## 2019-11-05 NOTE — Telephone Encounter (Signed)
Would continue daily metolazone and lasix through the weekend, if breathing gets worse, may need to come to the ER.  Dr Lemmie Evens

## 2019-11-07 NOTE — Progress Notes (Deleted)
Cardiology Office Note   Date:  11/07/2019   ID:  Denise Freeman, DOB February 07, 1951, MRN [id number]  PCP:  System, Pcp Not In  Cardiologist:  Pixie Casino, MD EP: None  No chief complaint on file.     History of Present Illness: Denise Freeman is a 69 y.o. female with PMH of chronic combined CHF, non-ischemic cardiomyopathy, permanent atrial fibrillation, HTN, HLD, DM type 2, OSA on BiPAP, COPD on home O2, CKD stage 3, and bipolar disorder, who presents for ***  She was last seen by cardiology via a telemedicine visit with [name] 08/2019, at which time she reported weight gain despite taking metolazone 2.5mg  alternating with 5mg  daily. She was recommended to take metolazone 5mg  daily x3 days, then return to her usual dosing. She was recommended to follow-up in 6 months, however notify the office 10/26/19 that she was having worsening LE edema - again was instructed to take metolazone 5mg  daily for a few days. She was scheduled for this visit for close follow-up.   Her last echocardiogram 04/2019 showed EF 50-55%, mild LVH, G2DD, moderate biatrial enlargement, mild PI, and mildly elevated PA pressure. Her last ischemic evaluation was a cardiac catheterization in 2014 which was without CAD.   1. Acute on chronic combined CHF: echo 04/2019 showed improvement in EF from 40-45% in 2019 to 50-55% with G2DD.  - Torsemide? *** - Continue metolazone*** - Continue metoprolol succinate and losartan  2. Permanent atrial fibrillation: HR *** today - Continue metoprolol for rate control - Continue warfarin for stroke ppx - managed by ***  3. HTN: BP *** today - Continue metoprolol succinate, losartan, lasix, and metolazone  4. HLD: LDL 86 in 2019; not on medications - Continue dietary modifications to lower cholesterol  5. DM type 2: A1C 9.9 04/2019; goal <7 - Continue insulin per PCP - Consider SGLT2-inhibitor    Past Medical History:  Diagnosis Date   Allergy    Anemia     Anxiety    Asthma    Atrial fibrillation ([place])    Bipolar 1 disorder ([place])    Blind    "partially in both eyes" (03/14/2016)   Cholelithiasis    a. 09/2016 s/p Lap Chole.   Chronic bronchitis (HCC)    Chronic combined systolic and diastolic congestive heart failure ([place])    a. 09/2016 Echo: EF 30-35%.   Colon polyps    COPD (chronic obstructive pulmonary disease) (HCC)    Depression    Family history of adverse reaction to anesthesia    Uncle was positive for malignant hyperthermia; patient had testing done and was negative.   Fibromyalgia    GERD (gastroesophageal reflux disease)    Gout    High cholesterol    History of blood transfusion 06/2015   "bleeding from my rectum"   History of hiatal hernia    HOH (hard of hearing)    Hx of colonic polyps 03/21/2016   3 small adenomas no recall - co-morbidities   Neuropathy    Disc Back    NICM (nonischemic cardiomyopathy) ([place])    a. Previously worked up in [place], MD-->low EF with subsequent recovery.  Multiple caths (last ~ 2014 per pt report)--reportedly nl cors;  b. 09/2016 Echo: EF 30-35%, antsept/apical HK, mild MR, mildly dil LA, mod dil RA;  c. 09/2016 Lexi MV: EF 26%, glob HK, sept DK, med size, mod intensity fixed septal defect - BBB/PVC related artifact, no ischemia.   On home  oxygen therapy    "3L; 24/7" (03/14/2016)   OSA treated with BiPAP    uses biPAP, 10 (03/14/2016)   Osteoarthritis    Oxygen deficiency    Pneumonia    Type II diabetes mellitus ([place])     Past Surgical History:  Procedure Laterality Date   APPENDECTOMY     "they busted"   bladder stimulator     pt states, "it cannot be turned off; it's in my right hip; dead battery so it's not working anymore". (03/14/2016)   BLADDER SUSPENSION     2003, 2006 and 2010   CATARACT EXTRACTION W/PHACO Right 11/29/2014   Procedure: CATARACT EXTRACTION PHACO AND INTRAOCULAR LENS PLACEMENT (IOC);  Surgeon: Denise Guys, MD;  Location:  [facility];  Service: Ophthalmology;  Laterality: Right;  CDE:3.81   CATARACT EXTRACTION W/PHACO Left 12/13/2014   Procedure: CATARACT EXTRACTION PHACO AND INTRAOCULAR LENS PLACEMENT (IOC);  Surgeon: Denise Guys, MD;  Location: [facility];  Service: Ophthalmology;  Laterality: Left;  CDE:6.59   CERVICAL DISC SURGERY N/A 2009   4, 6, and 7 cervical disc replaced   CHOLECYSTECTOMY N/A 09/13/2016   Procedure: LAPAROSCOPIC CHOLECYSTECTOMY;  Surgeon: Denise Bookbinder, MD;  Location: [facility];  Service: General;  Laterality: N/A;   COLONOSCOPY WITH PROPOFOL N/A 03/15/2016   Procedure: COLONOSCOPY WITH PROPOFOL;  Surgeon: Denise Mayer, MD;  Location: [facility];  Service: Endoscopy;  Laterality: N/A;   ESOPHAGOGASTRODUODENOSCOPY (EGD) WITH PROPOFOL N/A 03/15/2016   Procedure: ESOPHAGOGASTRODUODENOSCOPY (EGD) WITH PROPOFOL;  Surgeon: Denise Mayer, MD;  Location: [facility];  Service: Endoscopy;  Laterality: N/A;   HEEL SPUR SURGERY Bilateral    HERNIA REPAIR     I & D EXTREMITY Right 06/13/2015   Procedure: MINOR IRRIGATION AND DEBRIDEMENT EXTREMITY REMOVAL OF NAIL;  Surgeon: Denise Brod, MD;  Location: [facility];  Service: Orthopedics;  Laterality: Right;   TUBAL LIGATION     UMBILICAL HERNIA REPAIR     w/mesh     Current Outpatient Medications  Medication Sig Dispense Refill   albuterol (PROVENTIL) (2.5 MG/3ML) 0.083% nebulizer solution USE (1) IN NEBULIZER EVERY 6 HOURS AS NEEDED FOR SHORTNESS OF BREATH. 75 mL 0   allopurinol (ZYLOPRIM) 100 MG tablet TAKE 1 TABLET BY MOUTH ONCE DAILY. 30 tablet 0   budesonide (PULMICORT) 0.5 MG/2ML nebulizer solution Take 2 mLs (0.5 mg total) by nebulization 2 (two) times daily. 120 mL 11   cetirizine (ZYRTEC) 10 MG tablet Take 1 tablet (10 mg total) by mouth at bedtime. 30 tablet 11   cholecalciferol (VITAMIN D) 400 units TABS tablet Take 1 tablet (400 Units total) by mouth 2 (two) times daily. 180 each 3   clobetasol cream  (TEMOVATE) 0.05 %      colchicine 0.6 MG tablet Take 1 tablet (0.6 mg total) by mouth as needed.     escitalopram (LEXAPRO) 10 MG tablet Take 1 tablet (10 mg total) by mouth at bedtime. 90 tablet 0   FEROSUL 325 (65 Fe) MG tablet Take 1 tablet by mouth daily.     fluticasone (FLONASE) 50 MCG/ACT nasal spray Place 1 spray into both nostrils 2 (two) times daily.     furosemide (LASIX) 40 MG tablet TAKE (2) TABLETS BY MOUTH THREE TIMES DAILY. 540 tablet 2   Insulin Glargine (BASAGLAR KWIKPEN) 100 UNIT/ML SOPN Inject 0.6 mLs (60 Units total) into the skin at bedtime. 15 mL 2   insulin lispro (HUMALOG KWIKPEN) 100 UNIT/ML KiwkPen Inject 0.1-0.16 mLs (10-16 Units total)  into the skin 3 (three) times daily before meals. 15 mL 2   Insulin Pen Needle 32G X 4 MM MISC Use to inject insulin 5 times daily 500 each 5   ipratropium (ATROVENT) 0.02 % nebulizer solution USE (1) IN NEBULIZER EVERY 6 HOURS AS NEEDED FOR SHORTNESS OF BREATH. 62.5 mL 0   losartan (COZAAR) 25 MG tablet TAKE (1/2) TABLET BY MOUTH ONCE DAILY. 15 tablet 11   metolazone (ZAROXOLYN) 2.5 MG tablet TAKE (1) TABLET BY MOUTH EVERY OTHER DAY ALTERNATING WITH 5MG  TABLET. 14 tablet 6   metolazone (ZAROXOLYN) 5 MG tablet TAKE 1 TABLET BY MOUTH ONCE EVERY OTHER DAY ALTERNATING WITH 2.5MG  TAB. 15 tablet 11   metoprolol succinate (TOPROL-XL) 25 MG 24 hr tablet TAKE 1 TABLET BY MOUTH TWICE DAILY 60 tablet 11   mirtazapine (REMERON) 15 MG tablet Take 1 tablet (15 mg total) by mouth at bedtime. 90 tablet 0   Misc Natural Products (TART CHERRY ADVANCED PO) Take by mouth daily.     modafinil (PROVIGIL) 200 MG tablet Take 1 tablet (200 mg total) by mouth daily. 30 tablet 5   mometasone (NASONEX) 50 MCG/ACT nasal spray Place 1 spray into the nose daily. 17 g 11   montelukast (SINGULAIR) 10 MG tablet TAKE ONE TABLET BY MOUTH AT BEDTIME. 28 tablet 11   mupirocin cream (BACTROBAN) 2 % Apply 1 application topically 2 (two) times daily. 15 g 0     nitroGLYCERIN (NITROSTAT) 0.4 MG SL tablet Place 0.4 mg under the tongue every 5 (five) minutes as needed for chest pain.     nystatin (NYSTATIN) powder Apply topically 2 (two) times daily. Apply under breasts 15 g 3   ondansetron (ZOFRAN) 4 MG tablet Take 4 mg by mouth once.     oxyCODONE-acetaminophen (PERCOCET/ROXICET) 5-325 MG tablet Take 1 tablet by mouth 2 (two) times daily as needed for severe pain. 60 tablet 0   pantoprazole (PROTONIX) 20 MG tablet TAKE (1) TABLET BY MOUTH TWICE A DAY BEFORE MEALS. (BREAKFAST AND SUPPER) 56 tablet 0   potassium chloride SA (KLOR-CON) 20 MEQ tablet Take 20 mEq by mouth 2 (two) times daily.     potassium chloride SA (KLOR-CON) 20 MEQ tablet Take 1 tablet (20 mEq total) by mouth 2 (two) times daily. 56 tablet 8   primidone (MYSOLINE) 50 MG tablet TAKE ONE TABLET BY MOUTH AT BEDTIME. 30 tablet 5   Probiotic Product (PROBIOTIC PO) Take 1 tablet by mouth at bedtime.     triamcinolone lotion (KENALOG) 0.1 % Apply 1 application topically 3 (three) times daily. For up to 14 days and as needed 60 mL 1   TRUE METRIX BLOOD GLUCOSE TEST test strip USE TO TEST BLOOD SUGAR AS DIRECTED. 50 each 2   Vitamin A 2400 MCG (8000 UT) CAPS Take 8,000 Units by mouth every morning.      warfarin (COUMADIN) 4 MG tablet TAKE 1 TABLET BY MOUTH ONCE DAILY EXCEPT ON THURSDAY TAKE 1/2 TABLET 30 tablet 11   No current facility-administered medications for this visit.    Allergies:   Xarelto [rivaroxaban], Ancef [cefazolin], Levaquin [levofloxacin in d5w], Lyrica [pregabalin], Tamiflu [oseltamivir phosphate], Zoloft [sertraline hcl], Torsemide, Augmentin [amoxicillin-pot clavulanate], Ciprofloxacin, Haldol [haloperidol], Nsaids, Penicillins, and Topamax [topiramate]    Social History:  The patient  reports that she quit smoking about 30 years ago. Her smoking use included cigarettes. She has a 28.00 pack-year smoking history. She has never used smokeless tobacco. She  reports that she does not  drink alcohol or use drugs.   Family History:  The patient's ***family history includes Bipolar disorder in her brother; Breast cancer in her mother; COPD in her mother and sister; Cancer in her maternal grandfather and maternal grandmother; Diabetes in her mother and sister; Heart disease in her father, maternal grandmother, mother, and sister; Hyperlipidemia in her father.    ROS:  Please see the history of present illness.   Otherwise, review of systems are positive for {NONE DEFAULTED:18576::"none"}.   All other systems are reviewed and negative.    PHYSICAL EXAM: VS:  There were no vitals taken for this visit. , BMI There is no height or weight on file to calculate BMI. GEN: Well nourished, well developed, in no acute distress HEENT: normal Neck: no JVD, carotid bruits, or masses Cardiac: ***RRR; no murmurs, rubs, or gallops,no edema  Respiratory:  clear to auscultation bilaterally, normal work of breathing GI: soft, nontender, nondistended, + BS MS: no deformity or atrophy Skin: warm and dry, no rash Neuro:  Strength and sensation are intact Psych: euthymic mood, full affect   EKG:  EKG {ACTION; IS/IS RSW:[id number]} ordered today. The ekg ordered today demonstrates ***   Recent Labs: 01/24/2019: ALT 17 01/25/2019: Magnesium 1.8 04/27/2019: B Natriuretic Peptide 318.0 04/30/2019: Hemoglobin 11.1; Platelets 186 05/02/2019: BUN 54; Creatinine, Ser 1.09; Potassium 4.1; Sodium 145    Lipid Panel    Component Value Date/Time   CHOL 163 07/29/2017 1115   TRIG 183 (H) 07/29/2017 1115   HDL 40 07/29/2017 1115   CHOLHDL 4.1 07/29/2017 1115   CHOLHDL 6.2 09/10/2016 1147   VLDL 30 09/10/2016 1147   LDLCALC 86 07/29/2017 1115   LDLDIRECT 153.0 04/19/2016 0932      Wt Readings from Last 3 Encounters:  08/19/19 202 lb (91.6 kg)  05/18/19 206 lb 9.6 oz (93.7 kg)  05/10/19 199 lb (90.3 kg)      Other studies Reviewed: Additional studies/ records  that were reviewed today include:   Echocardiogram 04/2019: 1. Left ventricular ejection fraction, by visual estimation, is 50 to  55%. The left ventricle has low normal function. There is mildly increased  left ventricular hypertrophy.  2. Elevated left atrial pressure.  3. Left ventricular diastolic parameters are consistent with Grade II  diastolic dysfunction (pseudonormalization).  4. Global right ventricle has mildly reduced systolic function.The right  ventricular size is moderately enlarged. No increase in right ventricular  wall thickness.  5. Left atrial size was moderately dilated.  6. Right atrial size was moderately dilated.  7. The mitral valve is normal in structure. No evidence of mitral valve  regurgitation. No evidence of mitral stenosis.  8. The tricuspid valve is normal in structure. Tricuspid valve  regurgitation is trivial.  9. The aortic valve has an indeterminant number of cusps. Aortic valve  regurgitation is not visualized. No evidence of aortic valve sclerosis or  stenosis.  10. The pulmonic valve was not well visualized. Pulmonic valve  regurgitation is mild.  11. Mildly elevated pulmonary artery systolic pressure.  12. The inferior vena cava is dilated in size with >50% respiratory  variability, suggesting right atrial pressure of 8 mmHg.     ASSESSMENT AND PLAN:  1.  ***   Current medicines are reviewed at length with the patient today.  The patient {ACTIONS; HAS/DOES NOT HAVE:19233} concerns regarding medicines.  The following changes have been made:  {PLAN; NO CHANGE:13088:s}  Labs/ tests ordered today include: *** No orders of the defined types were placed  in this encounter.    Disposition:   FU with *** in {gen number 4-71:[id number]} {Days to years:10300}  Signed, Abigail Butts, PA-C  11/07/2019 10:16 PM

## 2019-11-08 ENCOUNTER — Ambulatory Visit (HOSPITAL_COMMUNITY)
Admission: RE | Admit: 2019-11-08 | Discharge: 2019-11-08 | Disposition: A | Discharge status: Home / Self Care | Payer: Medicare Other | Source: Ambulatory Visit | Attending: Adult Health Nurse Practitioner | Admitting: Adult Health Nurse Practitioner

## 2019-11-08 ENCOUNTER — Ambulatory Visit: Payer: Medicare Other | Admitting: Medical

## 2019-11-08 ENCOUNTER — Other Ambulatory Visit: Payer: Self-pay

## 2019-11-08 DIAGNOSIS — R599 Enlarged lymph nodes, unspecified: Secondary | ICD-10-CM | POA: Diagnosis not present

## 2019-11-08 DIAGNOSIS — R590 Localized enlarged lymph nodes: Secondary | ICD-10-CM | POA: Diagnosis not present

## 2019-11-08 DIAGNOSIS — R102 Pelvic and perineal pain: Secondary | ICD-10-CM

## 2019-11-08 DIAGNOSIS — R1909 Other intra-abdominal and pelvic swelling, mass and lump: Secondary | ICD-10-CM | POA: Diagnosis not present

## 2019-11-08 NOTE — Telephone Encounter (Signed)
Patient is calling to follow up in regards to swelling. She states swelling has not subsided.   Pt c/o swelling: STAT is pt has developed SOB within 24 hours  1) How much weight have you gained and in what time span? 5 lbs in 2 weeks  2) If swelling, where is the swelling located? Legs, ankles & feet  3) Are you currently taking a fluid pill? Yes  4) Are you currently SOB? No  5) Do you have a log of your daily weights (if so, list)? No  6) Have you gained 3 pounds in a day or 5 pounds in a week? No  7) Have you traveled recently? No

## 2019-11-08 NOTE — Telephone Encounter (Signed)
She'll probably need IV lasix -would direct her to the ER if she is not improving.  Dr Lemmie Evens

## 2019-11-08 NOTE — Telephone Encounter (Signed)
Revonda called back stating she was off the phone and ready to speak with Lelon Frohlich. I reached out to [facility] via secure chat and then transferred the call.

## 2019-11-08 NOTE — Telephone Encounter (Signed)
Left message for pt to call.

## 2019-11-08 NOTE — Telephone Encounter (Addendum)
Spoke with the pt and she is reporting that she is still not feeling much better since the weekend she had taken her fluid pills and her breathing has improved some but her swelling is still not responding.   According to Dr. Lysbeth Penner note from 11/05/19:  Would continue daily metolazone and lasix through the weekend, if breathing gets worse, may need to come to the ER.  I asked the pt about going to the ED for more acute treatment than we can give her with just oral meds and have her go to the ED but she firmly declined. She is  asking if [name] can make any further changes in her med doses to help her get the added fluid off of her.   I will forward back to [name] for his review.

## 2019-11-08 NOTE — Telephone Encounter (Signed)
Follow up ° ° °Patient is returning your call. Please call. ° ° ° °

## 2019-11-08 NOTE — Telephone Encounter (Signed)
Pt to call back she was on the phone paying a bill when I spoke with her.

## 2019-11-09 NOTE — Telephone Encounter (Signed)
Pt says she is improving and will continue to monitor and watch her NA intake.

## 2019-11-09 NOTE — Telephone Encounter (Signed)
LMTCB

## 2019-11-10 DIAGNOSIS — E1165 Type 2 diabetes mellitus with hyperglycemia: Secondary | ICD-10-CM | POA: Diagnosis not present

## 2019-11-10 DIAGNOSIS — W19XXXA Unspecified fall, initial encounter: Secondary | ICD-10-CM | POA: Diagnosis not present

## 2019-11-10 DIAGNOSIS — E559 Vitamin D deficiency, unspecified: Secondary | ICD-10-CM | POA: Diagnosis not present

## 2019-11-10 DIAGNOSIS — D649 Anemia, unspecified: Secondary | ICD-10-CM | POA: Diagnosis not present

## 2019-11-10 DIAGNOSIS — E1122 Type 2 diabetes mellitus with diabetic chronic kidney disease: Secondary | ICD-10-CM | POA: Diagnosis not present

## 2019-11-10 DIAGNOSIS — E785 Hyperlipidemia, unspecified: Secondary | ICD-10-CM | POA: Diagnosis not present

## 2019-11-10 DIAGNOSIS — D638 Anemia in other chronic diseases classified elsewhere: Secondary | ICD-10-CM | POA: Diagnosis not present

## 2019-11-10 DIAGNOSIS — D509 Iron deficiency anemia, unspecified: Secondary | ICD-10-CM | POA: Diagnosis not present

## 2019-11-10 DIAGNOSIS — E11620 Type 2 diabetes mellitus with diabetic dermatitis: Secondary | ICD-10-CM | POA: Diagnosis not present

## 2019-11-10 DIAGNOSIS — D631 Anemia in chronic kidney disease: Secondary | ICD-10-CM | POA: Diagnosis not present

## 2019-11-10 DIAGNOSIS — E782 Mixed hyperlipidemia: Secondary | ICD-10-CM | POA: Diagnosis not present

## 2019-11-10 DIAGNOSIS — E1140 Type 2 diabetes mellitus with diabetic neuropathy, unspecified: Secondary | ICD-10-CM | POA: Diagnosis not present

## 2019-11-10 DIAGNOSIS — M25512 Pain in left shoulder: Secondary | ICD-10-CM | POA: Diagnosis not present

## 2019-11-12 ENCOUNTER — Ambulatory Visit: Payer: Self-pay | Admitting: *Deleted

## 2019-11-12 ENCOUNTER — Other Ambulatory Visit (HOSPITAL_COMMUNITY): Payer: Self-pay | Admitting: Internal Medicine

## 2019-11-12 ENCOUNTER — Other Ambulatory Visit: Payer: Self-pay | Admitting: Internal Medicine

## 2019-11-12 DIAGNOSIS — R1909 Other intra-abdominal and pelvic swelling, mass and lump: Secondary | ICD-10-CM

## 2019-11-15 ENCOUNTER — Ambulatory Visit: Payer: Self-pay | Admitting: *Deleted

## 2019-11-19 ENCOUNTER — Ambulatory Visit: Payer: Self-pay | Admitting: *Deleted

## 2019-11-26 ENCOUNTER — Ambulatory Visit: Payer: Self-pay | Admitting: *Deleted

## 2019-11-29 ENCOUNTER — Ambulatory Visit: Payer: Self-pay | Admitting: *Deleted

## 2019-11-30 ENCOUNTER — Other Ambulatory Visit: Payer: Self-pay | Admitting: *Deleted

## 2019-11-30 NOTE — Patient Outreach (Signed)
[facility]) Care Management  [address]  11/30/2019   Denise Freeman 1950/11/22 [id number]  Subjective: Telephone call to patient's home number, spoke with patient, and HIPAA verified.  Patient states she is doing fair.   States she accidentally rolled out of her hospital bed approximately 1 month ago, rolled over too far, MD aware, had some bruising on left side, and no major injuries. Patient states she is aware of signs/ symptoms to report, how to reach provider if needed after hours, when to go to ED, and / or call 911.  States seasonal allergies are acting up and taking medications as needed to manage them.  States she had the following appointment since last [facility] outreach: on 08/19/2019 with cardiologist, on 08/23/2019 with pulmonologist, and on 10/13/2019 with nephrologist, all appointments went well.   States she has the following scheduled appointments: on 12/02/2019 for CT Scan of the abdomen, on 719/2021 with pulmonologist, and on 01/19/2020 with nephrologist.  Patient states she does not have any transportation, [employer], or pharmacy needs at this time.  States she is very appreciative of the follow up and is in agreement to continue to receive [place] Management information / services.     Objective:  Per KPN (Knowledge Performance Now, point of care tool) and chart review,patient hospitalized 04/27/2019 - 05/02/2019 forAcute on chronic combined systolic and diastolic CHF (congestive heart failure). Patient hospitalized 01/22/2019 - 01/25/2019 for Atrial fibrillation,cute on chronic combined systolic and diastolic CHF (congestive heart failure). 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 Patient closed to [place] Management services on 02/10/2019 due to unable to contact.   Encounter Medications:  Outpatient Encounter Medications as of 11/30/2019  Medication Sig Note  . albuterol (PROVENTIL) (2.5 MG/3ML) 0.083% nebulizer solution USE (1) IN NEBULIZER EVERY 6 HOURS AS NEEDED FOR SHORTNESS OF BREATH.   Marland Kitchen allopurinol (ZYLOPRIM) 100 MG tablet TAKE 1 TABLET BY MOUTH ONCE DAILY.   . budesonide (PULMICORT) 0.5 MG/2ML nebulizer solution Take 2 mLs (0.5 mg total) by nebulization 2 (two) times daily.   . cetirizine (ZYRTEC) 10 MG tablet Take 1 tablet (10 mg total) by mouth at bedtime.   . cholecalciferol (VITAMIN D) 400 units TABS tablet Take 1 tablet (400 Units total) by mouth 2 (two) times daily.   . clobetasol cream (TEMOVATE) 0.05 %    . colchicine 0.6 MG tablet Take 1 tablet (0.6 mg total) by mouth as needed.   Marland Kitchen escitalopram (LEXAPRO) 10 MG tablet Take 1 tablet (10 mg total) by mouth at bedtime.   . FEROSUL 325 (65 Fe) MG tablet Take 1 tablet by mouth daily.   . fluticasone (FLONASE) 50 MCG/ACT nasal spray Place 1 spray into both nostrils 2 (two) times daily.   . furosemide (LASIX) 40 MG tablet TAKE (2) TABLETS BY MOUTH THREE TIMES DAILY.   Marland Kitchen Insulin Glargine (BASAGLAR KWIKPEN) 100 UNIT/ML SOPN Inject 0.6 mLs (60 Units total) into the skin at bedtime.   . insulin lispro (HUMALOG KWIKPEN) 100 UNIT/ML KiwkPen Inject 0.1-0.16 mLs (10-16 Units total) into the skin 3 (three) times daily before meals.   . Insulin Pen Needle 32G X 4 MM MISC Use to inject insulin 5 times daily   . losartan (COZAAR) 25 MG tablet TAKE (1/2)  TABLET BY MOUTH ONCE DAILY.   . metolazone (ZAROXOLYN) 2.5 MG tablet TAKE (1) TABLET BY MOUTH EVERY OTHER DAY ALTERNATING WITH 5MG TABLET.   Marland Kitchen metolazone (ZAROXOLYN) 5 MG tablet TAKE 1 TABLET BY  MOUTH ONCE EVERY OTHER DAY ALTERNATING WITH 2.5MG TAB.   Marland Kitchen metoprolol succinate (TOPROL-XL) 25 MG 24 hr tablet TAKE 1 TABLET BY MOUTH TWICE DAILY   . mirtazapine (REMERON) 15 MG tablet Take 1 tablet (15 mg total) by mouth at bedtime.   . Misc Natural Products (TART CHERRY ADVANCED PO) Take by mouth daily.   . modafinil (PROVIGIL) 200 MG tablet Take 1 tablet (200 mg total) by mouth daily.   . mometasone (NASONEX) 50 MCG/ACT nasal spray Place 1 spray into the nose daily.   . montelukast (SINGULAIR) 10 MG tablet TAKE ONE TABLET BY MOUTH AT BEDTIME.   . mupirocin cream (BACTROBAN) 2 % Apply 1 application topically 2 (two) times daily.   Marland Kitchen nystatin (NYSTATIN) powder Apply topically 2 (two) times daily. Apply under breasts   . ondansetron (ZOFRAN) 4 MG tablet Take 4 mg by mouth once.   Marland Kitchen oxyCODONE-acetaminophen (PERCOCET/ROXICET) 5-325 MG tablet Take 1 tablet by mouth 2 (two) times daily as needed for severe pain. 01/29/2018: Patient states that she take 7.5 mg tid   . potassium chloride SA (KLOR-CON) 20 MEQ tablet Take 1 tablet (20 mEq total) by mouth 2 (two) times daily.   . primidone (MYSOLINE) 50 MG tablet TAKE ONE TABLET BY MOUTH AT BEDTIME.   . Probiotic Product (PROBIOTIC PO) Take 1 tablet by mouth at bedtime.   . triamcinolone lotion (KENALOG) 0.1 % Apply 1 application topically 3 (three) times daily. For up to 14 days and as needed   . TRUE METRIX BLOOD GLUCOSE TEST test strip USE TO TEST BLOOD SUGAR AS DIRECTED.   . Vitamin A 2400 MCG (8000 UT) CAPS Take 8,000 Units by mouth every morning.    . warfarin (COUMADIN) 4 MG tablet TAKE 1 TABLET BY MOUTH ONCE DAILY EXCEPT ON THURSDAY TAKE 1/2 TABLET   . ipratropium (ATROVENT) 0.02 % nebulizer solution USE (1) IN NEBULIZER EVERY 6 HOURS AS NEEDED FOR SHORTNESS OF BREATH.   . nitroGLYCERIN (NITROSTAT) 0.4 MG SL tablet Place 0.4 mg under the tongue every 5 (five) minutes as needed for chest pain. (Patient not taking: Reported on 11/30/2019) 05/10/2019:  States she has not taken, not needed, will need new refill, and she will follow up with MD.   . pantoprazole (PROTONIX) 20 MG tablet TAKE (1) TABLET BY MOUTH TWICE A DAY BEFORE MEALS. (BREAKFAST AND SUPPER)   . potassium chloride SA (KLOR-CON) 20 MEQ tablet Take 20 mEq by mouth 2 (two) times daily. (Patient not taking: Reported on 4/58/0998) 3/38/2505: Duplicate   . [DISCONTINUED] potassium chloride 20 MEQ TBCR Take 20 mEq by mouth 2 (two) times daily.    No facility-administered encounter medications on file as of 11/30/2019.    Functional Status:  In your present state of health, do you have any difficulty performing the following activities: 05/17/2019 05/10/2019  Hearing? Y Y  Comment Hard of hearing. -  Vision? Y Y  Comment Legally blind in both eyes. -  Difficulty concentrating or making decisions? N N  Walking or climbing stairs? Y Y  Comment Difficulty navigating around, due to being legally blind.  At moderate risk for falls. -  Dressing or bathing? Y Y  Comment Becomes short-of-breath and is unsteady on her feet. States due to her breathing  Doing errands, shopping? Y Y  Comment Unable to drive due to being legally blind. -  Preparing Food and eating ? N N  Using the Toilet? N N  In the past six months, have you accidently leaked urine? N N  Do you have problems with loss of bowel control? N N  Managing your Medications? N N  Managing your Finances? N N  Housekeeping or managing your Housekeeping? Y Y  Some recent data might be hidden    Fall/Depression Screening: Fall Risk  06/11/2019 05/17/2019 05/10/2019  Falls in the past year? '1 1 1  ' Number falls in past yr: '1 1 1  ' Injury with Fall? '1 1 1  ' Comment - - -  Risk Factor Category  - - -  Comment - - -  Risk for fall due to : History of fall(s) History of fall(s);Impaired vision;Impaired mobility History of fall(s)  Risk for fall due to: Comment - - -  Follow up Falls evaluation completed;Education provided Education  provided;Falls prevention discussed Falls evaluation completed;Education provided   [facility] 2/9 Scores 05/17/2019 11/28/2017 11/27/2017 10/03/2017 09/15/2017 09/05/2017 08/07/2017  PHQ - 2 Score 0 0 5 6 0 0 0  PHQ- 9 Score - - - 23 - - -    Assessment: Received [facility] referral on 05/03/2019. Referral source: Sharley Hunnicutt. Referral reason:     Reason for consult ->disease and medication managment       Diagnoses of -> Heart Failure  Transition of carefollow up completed. Willcontinue tofollow upmonthly or bimonthly, for congestive heart failure disease monitoring and education.Patient has been [facility]to [facility] Care Management Social Worker for caregiver resources, housekeeping resources, visual aid/ support services resources,recliner chair community resources,Advanced Directive packet, and follow up on document completion.    Plan:  [facility] CM Care Plan Problem One     Most Recent Value  Care Plan Problem One Patient hospitalized 04/27/2019 - 05/02/2019 for acute on chronic combined systolic diastolic congestive heart failure  Role Documenting the Problem One Care Management Telephonic Coordinator  Care Plan for Problem One Active  THN Long Term Goal  Patient will verablize no hospitalization for any reason over the next 31 days.  THN Long Term Goal Start Date 05/10/19  Interventions for Problem One Long Term Goal RNCM encouraged patient to continue to wear mask and social distance when in public.   THN CM Short Term Goal #1  In the next 30 days patient will verbalized that she has attended all hospital MD follow up appointments.  THN CM Short Term Goal #1 Start Date 05/10/19  Interventions for Short Term Goal #1 RNCM verified patient has attended all MD appointments since last outreach.  Also verified dates of upcoming appointments.  THN CM Short Term Goal #2  In the next 30 days patient will verbalize that is receiving home health services and actively participating  with visits.  THN CM Short Term Goal #2 Start Date 05/10/19  [facility] CM Short Term Goal #2 Met Date 07/02/19      RNCM will call patient for telephone outreach attempt within90business days,congestive heart failure disease management/ monitoring / education.     Capria Cartaya H. Annia Friendly, BSN, [place] Management [facility] Telephonic CM Phone: [phone] Fax: [phone]

## 2019-12-02 ENCOUNTER — Other Ambulatory Visit: Payer: Self-pay

## 2019-12-02 ENCOUNTER — Ambulatory Visit (HOSPITAL_COMMUNITY)
Admission: RE | Admit: 2019-12-02 | Discharge: 2019-12-02 | Disposition: A | Discharge status: Home / Self Care | Payer: Medicare Other | Source: Ambulatory Visit | Attending: Internal Medicine | Admitting: Internal Medicine

## 2019-12-02 DIAGNOSIS — R1909 Other intra-abdominal and pelvic swelling, mass and lump: Secondary | ICD-10-CM

## 2019-12-20 ENCOUNTER — Ambulatory Visit: Payer: Medicare Other | Admitting: Pulmonary Disease

## 2019-12-27 ENCOUNTER — Telehealth: Payer: Self-pay | Admitting: Internal Medicine

## 2019-12-27 MED ORDER — METOLAZONE 5 MG PO TABS: 5.0000 mg | ORAL_TABLET | Freq: Every day | ORAL | 6 refills | Status: DC

## 2019-12-27 NOTE — Telephone Encounter (Signed)
If 5 mg daily is working for her, she can continue on that.  Dr Lemmie Evens

## 2019-12-27 NOTE — Telephone Encounter (Signed)
Returned call to patient she stated she wanted to ask Dr.Hilty if she can take Metolazone 5 mg every day.Stated she has meds prepared in a pill pack and she is running out of 5 mg before it is time.She is having to open a new pill pack and it causes her to run short.Message sent to Dr.Hilty.

## 2019-12-27 NOTE — Telephone Encounter (Signed)
New Message   Pt c/o medication issue:  1. Name of Medication: metolazone (ZAROXOLYN) 5 MG tablet   2. How are you currently taking this medication (dosage and times per day)?   3. Are you having a reaction (difficulty breathing--STAT)?   4. What is your medication issue? Patient is calling because she would like to just get the 5mg  and not the 2.5mg (she does not want to continue alternate) she rather just be on the 5mg .Please call to discuss.

## 2019-12-27 NOTE — Telephone Encounter (Signed)
Spoke to patient Dr.Hilty advised ok to take Metolazone 5 mg daily.She stated 5 mg works well.Prescription sent to pharmacy.

## 2020-01-18 ENCOUNTER — Other Ambulatory Visit: Payer: Self-pay

## 2020-01-18 ENCOUNTER — Other Ambulatory Visit: Payer: Self-pay | Admitting: Internal Medicine

## 2020-01-18 DIAGNOSIS — I4891 Unspecified atrial fibrillation: Secondary | ICD-10-CM

## 2020-01-21 ENCOUNTER — Other Ambulatory Visit (HOSPITAL_COMMUNITY): Payer: Self-pay | Admitting: Internal Medicine

## 2020-01-21 DIAGNOSIS — M25562 Pain in left knee: Secondary | ICD-10-CM

## 2020-01-25 DIAGNOSIS — N1831 Chronic kidney disease, stage 3a: Secondary | ICD-10-CM | POA: Diagnosis not present

## 2020-01-25 DIAGNOSIS — E873 Alkalosis: Secondary | ICD-10-CM | POA: Diagnosis not present

## 2020-01-25 DIAGNOSIS — D509 Iron deficiency anemia, unspecified: Secondary | ICD-10-CM | POA: Diagnosis not present

## 2020-01-25 DIAGNOSIS — I129 Hypertensive chronic kidney disease with stage 1 through stage 4 chronic kidney disease, or unspecified chronic kidney disease: Secondary | ICD-10-CM | POA: Diagnosis not present

## 2020-01-25 DIAGNOSIS — E559 Vitamin D deficiency, unspecified: Secondary | ICD-10-CM | POA: Diagnosis not present

## 2020-01-26 ENCOUNTER — Other Ambulatory Visit: Payer: Self-pay | Admitting: *Deleted

## 2020-01-26 NOTE — Patient Outreach (Signed)
[facility]) Care Management  01/26/2020  Ajeenah Heiny [facility] 1950/11/06 [id number]   Telephone Assessment  Care manager reassigned on 01/20/20.  Chronic Care  Management Program   Per Chart Review Patient is a 69 year old female with PMHX: Combined Systolic and Diastolic Heart Failure, Chronic hypoxic respiratory failure, chronic oxygen therapy, OSA with Bipap, Chronic pain syndrome, hypertension, Diabetes, Depression with anxiety, partially blind both eyes, hard of hearing .   Recent Hospital Admission : 04/27/19 Acute on Chronic congestive heart failure.   Subjective:  Unsuccessful outreach call to patient , no answer able to leave a HIPAA compliant message for return call.    Plan  Will plan return call in the next 4 business days.  Will send [facility] outreach letter since this is my initial outreach attempt and Care Coordinator is new  to patient.   Joylene Draft, RN, BSN  [place] Management Coordinator  [phone]- Mobile [phone]- Toll Free Main Office

## 2020-01-31 ENCOUNTER — Other Ambulatory Visit: Payer: Self-pay | Admitting: *Deleted

## 2020-01-31 ENCOUNTER — Encounter (INDEPENDENT_AMBULATORY_CARE_PROVIDER_SITE_OTHER): Payer: Medicare Other | Admitting: Ophthalmology

## 2020-01-31 NOTE — Patient Outreach (Signed)
[facility]) Care Management  01/31/2020  Romilda Proby [facility] 1951/05/06 [id number]   Telephone Assessment  Care manager reassigned on 01/20/20.    Per Chart Review Patient is a 69 year old female with PMHX: Combined Systolic and Diastolic Heart Failure, Chronic hypoxic respiratory failure, chronic oxygen therapy, OSA with Bipap, Chronic pain syndrome, hypertension, Diabetes, Depression with anxiety, partially blind both eyes, hard of hearing .   Recent Hospital Admissions : 04/27/19 Acute on Chronic congestive heart failure.   Outreach call attempt #2 Subjective:  Unsuccessful outreach call to patient , no answer able to leave a HIPAA compliant message for return call.    Plan  Will plan return call in the next 4 business days.  Joylene Draft, RN, BSN  [place] Management Coordinator  [phone]- Mobile [phone]- Toll Free Main Office

## 2020-02-01 ENCOUNTER — Other Ambulatory Visit: Payer: Self-pay | Admitting: *Deleted

## 2020-02-01 ENCOUNTER — Encounter: Payer: Self-pay | Admitting: *Deleted

## 2020-02-01 NOTE — Patient Outreach (Signed)
[facility]) Care Management  [place]  02/01/2020   Denise Freeman [birth year] [id number]   Telephone Assessment  Chronic care management  Care manager reassigned on 01/20/20.    Per Chart Review Patient is a 69 year old female with PMHX: Combined Systolic and Diastolic Heart Failure, Chronic hypoxic respiratory failure, chronic oxygen therapy, OSA with Bipap, Chronic pain syndrome, hypertension, Diabetes ( A1c 13.9% on 11/03/19) Depression with anxiety, partially blind both eyes, hard of hearing .   Recent Hospital Admissions : 04/27/19 Acute on Chronic congestive heart failure.   Subjective:  Incoming return call from patient, returning call from outreach call on 01/31/20. Introduced self to patient , discussed prior outreach from previous Transport planner, patient voiced understanding.   Patient states that she is doing fair. She reports tolerating he usual limited activity in the home, she reports using a walker denies falls. She reports that she and her husband help each other out.  She discussed medical conditions of Heart Failure, denies increase in shortness of breath, swelling or weight gain reports weights 180- 182 range.  She reports continuing to wear oxygen at 3 liters 24 hours a day  And using bipap at bedtime and during the day with naps, denies worsening shortness of breath or cough.   She reports that her blood sugar readings were high the last time checked at doctor office and insulin was increased she reports taking insulin as prescribed.. Patient discussed being without her free style libre sensor for a few months and she has ordered sensors and they should arrive the next day or so. She reports that she usually goes to Dr. Nevada Crane office and they place sensor on for her since she has legally blind and her spouse is unable to assist with that. She states that she has a true metrix meter that she has been using and she questions if accurate, she reports  getting readings at 400 at times but they do not stay at that,  states that is not usual for her.  Reviewed high and low blood sugar reading and when to notify MD.  She denies having low blood sugar reading of 70.   Patient reports that she does have paperwork to complete Advanced Directive as assisted by [facility] LCSW [employer], states she  just hasn't followed up to get notarized stating she has the information packet and where to go.    Encounter Medications:  Outpatient Encounter Medications as of 02/01/2020  Medication Sig Note  . albuterol (PROVENTIL) (2.5 MG/3ML) 0.083% nebulizer solution USE (1) IN NEBULIZER EVERY 6 HOURS AS NEEDED FOR SHORTNESS OF BREATH.   Marland Kitchen allopurinol (ZYLOPRIM) 100 MG tablet TAKE 1 TABLET BY MOUTH ONCE DAILY.   . budesonide (PULMICORT) 0.5 MG/2ML nebulizer solution Take 2 mLs (0.5 mg total) by nebulization 2 (two) times daily.   . cetirizine (ZYRTEC) 10 MG tablet Take 1 tablet (10 mg total) by mouth at bedtime.   . cholecalciferol (VITAMIN D) 400 units TABS tablet Take 1 tablet (400 Units total) by mouth 2 (two) times daily.   . clobetasol cream (TEMOVATE) 0.05 %    . colchicine 0.6 MG tablet Take 1 tablet (0.6 mg total) by mouth as needed.   Marland Kitchen escitalopram (LEXAPRO) 10 MG tablet Take 1 tablet (10 mg total) by mouth at bedtime.   . FEROSUL 325 (65 Fe) MG tablet Take 1 tablet by mouth daily.   . fluticasone (FLONASE) 50 MCG/ACT nasal spray Place 1 spray into  both nostrils 2 (two) times daily.   . furosemide (LASIX) 40 MG tablet TAKE (2) TABLETS BY MOUTH THREE TIMES DAILY.   Marland Kitchen Insulin Glargine (BASAGLAR KWIKPEN) 100 UNIT/ML SOPN Inject 0.6 mLs (60 Units total) into the skin at bedtime. (Patient taking differently: Inject 40 Units into the skin 2 (two) times daily. )   . insulin lispro (HUMALOG KWIKPEN) 100 UNIT/ML KiwkPen Inject 0.1-0.16 mLs (10-16 Units total) into the skin 3 (three) times daily before meals. (Patient taking differently: Inject 16-18 Units into the  skin 3 (three) times daily before meals. )   . Insulin Pen Needle 32G X 4 MM MISC Use to inject insulin 5 times daily   . ipratropium (ATROVENT) 0.02 % nebulizer solution USE (1) IN NEBULIZER EVERY 6 HOURS AS NEEDED FOR SHORTNESS OF BREATH.   Marland Kitchen losartan (COZAAR) 25 MG tablet TAKE (1/2) TABLET BY MOUTH ONCE DAILY.   . metolazone (ZAROXOLYN) 5 MG tablet Take 1 tablet (5 mg total) by mouth daily.   . metoprolol succinate (TOPROL-XL) 25 MG 24 hr tablet TAKE 1 TABLET BY MOUTH TWICE DAILY   . mirtazapine (REMERON) 15 MG tablet Take 1 tablet (15 mg total) by mouth at bedtime.   . Misc Natural Products (TART CHERRY ADVANCED PO) Take by mouth daily.   . modafinil (PROVIGIL) 200 MG tablet Take 1 tablet (200 mg total) by mouth daily.   . mometasone (NASONEX) 50 MCG/ACT nasal spray Place 1 spray into the nose daily.   . montelukast (SINGULAIR) 10 MG tablet TAKE ONE TABLET BY MOUTH AT BEDTIME.   . mupirocin cream (BACTROBAN) 2 % Apply 1 application topically 2 (two) times daily.   . nitroGLYCERIN (NITROSTAT) 0.4 MG SL tablet Place 0.4 mg under the tongue every 5 (five) minutes as needed for chest pain. (Patient not taking: Reported on 11/30/2019) 05/10/2019: States she has not taken, not needed, will need new refill, and she will follow up with MD.   . nystatin (NYSTATIN) powder Apply topically 2 (two) times daily. Apply under breasts   . ondansetron (ZOFRAN) 4 MG tablet Take 4 mg by mouth once.   Marland Kitchen oxyCODONE-acetaminophen (PERCOCET/ROXICET) 5-325 MG tablet Take 1 tablet by mouth 2 (two) times daily as needed for severe pain. 01/29/2018: Patient states that she take 7.5 mg tid   . pantoprazole (PROTONIX) 20 MG tablet TAKE (1) TABLET BY MOUTH TWICE A DAY BEFORE MEALS. (BREAKFAST AND SUPPER)   . potassium chloride SA (KLOR-CON) 20 MEQ tablet Take 20 mEq by mouth 2 (two) times daily. (Patient not taking: Reported on 5/00/3704) 8/88/9169: Duplicate   . potassium chloride SA (KLOR-CON) 20 MEQ tablet Take 1 tablet (20  mEq total) by mouth 2 (two) times daily.   . primidone (MYSOLINE) 50 MG tablet TAKE ONE TABLET BY MOUTH AT BEDTIME.   . Probiotic Product (PROBIOTIC PO) Take 1 tablet by mouth at bedtime.   . triamcinolone lotion (KENALOG) 0.1 % Apply 1 application topically 3 (three) times daily. For up to 14 days and as needed   . TRUE METRIX BLOOD GLUCOSE TEST test strip USE TO TEST BLOOD SUGAR AS DIRECTED.   . Vitamin A 2400 MCG (8000 UT) CAPS Take 8,000 Units by mouth every morning.    . warfarin (COUMADIN) 4 MG tablet TAKE 1 TABLET BY MOUTH ONCE DAILY EXCEPT ON THURSDAY TAKE 1/2 TABLET   . [DISCONTINUED] potassium chloride 20 MEQ TBCR Take 20 mEq by mouth 2 (two) times daily.    No facility-administered encounter medications on  file as of 02/01/2020.    Functional Status:  In your present state of health, do you have any difficulty performing the following activities: 05/17/2019 05/10/2019  Hearing? Y Y  Comment Hard of hearing. -  Vision? Y Y  Comment Legally blind in both eyes. -  Difficulty concentrating or making decisions? N N  Walking or climbing stairs? Y Y  Comment Difficulty navigating around, due to being legally blind.  At moderate risk for falls. -  Dressing or bathing? Y Y  Comment Becomes short-of-breath and is unsteady on her feet. States due to her breathing  Doing errands, shopping? Y Y  Comment Unable to drive due to being legally blind. -  Preparing Food and eating ? N N  Using the Toilet? N N  In the past six months, have you accidently leaked urine? N N  Do you have problems with loss of bowel control? N N  Managing your Medications? N N  Managing your Finances? N N  Housekeeping or managing your Housekeeping? Y Y  Some recent data might be hidden    Fall/Depression Screening: Fall Risk  02/01/2020 06/11/2019 05/17/2019  Falls in the past year? 1 1 1   Number falls in past yr: 0 1 1  Injury with Fall? 0 1 1  Comment - - -  Risk Factor Category  - - -  Comment - - -    Risk for fall due to : History of fall(s);Impaired balance/gait;Impaired vision History of fall(s) History of fall(s);Impaired vision;Impaired mobility  Risk for fall due to: Comment - - -  Follow up Falls prevention discussed Falls evaluation completed;Education provided Education provided;Falls prevention discussed   PHQ 2/9 Scores 05/17/2019 11/28/2017 11/27/2017 10/03/2017 09/15/2017 09/05/2017 08/07/2017  PHQ - 2 Score 0 0 5 6 0 0 0  PHQ- 9 Score - - - 23 - - -    Assessment:   Heart Failure Patient reports continuing daily self care management with daily weight monitoring, limiting salt and taking medications as prescribed.  COPD No worsening symptoms, reports being consistent with medications Diabetes Noted A1c 13.9% on 11/10/19 , patient understands that this is elevated range, she will benefit from ongoing education and support on self health management of Diabetes.   Plan: Will plan follow in the next month.  Will send MD barrier/involvment letter.   Goals    .  Patient Stated to work toward improving blood sugar control (pt-stated)      [place] (see longtitudinal plan of care for additional care plan information)   Current Barriers:  Marland Kitchen Knowledge Deficits related to basic Diabetes pathophysiology and self care/management, Elevated hemoglobin A1c at 13.9%  Case Manager Clinical Goal(s):  Over the next 90 days, patient will demonstrate improved adherence to prescribed treatment plan for diabetes self care/management as evidenced by:  Marland Kitchen Verbalize daily monitoring and recording of CBG within 14 days . Verbalize adherence to prescribed medication regimen within the next 14 days  .    Interventions:  . Reviewed medications with patient and discussed importance of medication adherence . Discussed plans with patient for ongoing care management follow up and provided patient with direct contact information for care management team . Reviewed scheduled/upcoming provider  appointments including: Eye exam  . Advised patient, providing education and rationale, to check cbg 4 times a day and record, calling Dr Nevada Crane  for findings outside established parameters.   . Review signs and symptoms of low blood sugar readings and treatment plan.  Patient Self Care Activities:  . Self administers oral medications as prescribed . Self administers insulin as prescribed . Attends all scheduled provider appointments . Checks blood sugars as prescribed and utilize hyper and hypoglycemia protocol as needed  Initial goal documentation        Joylene Draft, RN, BSN  [place] Management Coordinator  [phone]- Mobile [phone]- [place]

## 2020-02-03 ENCOUNTER — Other Ambulatory Visit: Payer: Self-pay

## 2020-02-03 ENCOUNTER — Ambulatory Visit (INDEPENDENT_AMBULATORY_CARE_PROVIDER_SITE_OTHER): Payer: Medicare Other | Admitting: Pharmacist

## 2020-02-03 DIAGNOSIS — Z7901 Long term (current) use of anticoagulants: Secondary | ICD-10-CM

## 2020-02-03 DIAGNOSIS — I4891 Unspecified atrial fibrillation: Secondary | ICD-10-CM

## 2020-02-03 LAB — POCT INR: INR: 2.3 (ref 2.0–3.0)

## 2020-02-03 MED ORDER — WARFARIN SODIUM 4 MG PO TABS: ORAL_TABLET | ORAL | 2 refills | Status: DC

## 2020-02-03 NOTE — Patient Instructions (Signed)
Description   Take warfarin 1 1/2 tablets tonight then resume 1 tablet daily except 1/2 tablet on Sundays Recheck in 4 weeks in the office.

## 2020-02-11 ENCOUNTER — Other Ambulatory Visit: Payer: Self-pay | Admitting: Pulmonary Disease

## 2020-02-11 ENCOUNTER — Other Ambulatory Visit: Payer: Self-pay | Admitting: Internal Medicine

## 2020-02-17 ENCOUNTER — Encounter: Payer: Self-pay | Admitting: Pulmonary Disease

## 2020-02-17 ENCOUNTER — Other Ambulatory Visit: Payer: Self-pay | Admitting: Pulmonary Disease

## 2020-02-17 ENCOUNTER — Other Ambulatory Visit: Payer: Self-pay | Admitting: *Deleted

## 2020-02-17 ENCOUNTER — Ambulatory Visit (INDEPENDENT_AMBULATORY_CARE_PROVIDER_SITE_OTHER): Payer: Medicare Other | Admitting: Pulmonary Disease

## 2020-02-17 ENCOUNTER — Other Ambulatory Visit: Payer: Self-pay

## 2020-02-17 VITALS — BP 114/62 | HR 69 | Temp 97.5°F | Ht 59.0 in | Wt 174.0 lb

## 2020-02-17 DIAGNOSIS — J9611 Chronic respiratory failure with hypoxia: Secondary | ICD-10-CM | POA: Diagnosis not present

## 2020-02-17 DIAGNOSIS — J449 Chronic obstructive pulmonary disease, unspecified: Secondary | ICD-10-CM | POA: Diagnosis not present

## 2020-02-17 DIAGNOSIS — Z7189 Other specified counseling: Secondary | ICD-10-CM

## 2020-02-17 DIAGNOSIS — G4733 Obstructive sleep apnea (adult) (pediatric): Secondary | ICD-10-CM

## 2020-02-17 MED ORDER — IPRATROPIUM BROMIDE 0.02 % IN SOLN: 0.5000 mg | Freq: Four times a day (QID) | RESPIRATORY_TRACT | 5 refills | Status: AC | PRN

## 2020-02-17 MED ORDER — MONTELUKAST SODIUM 10 MG PO TABS: 10.0000 mg | ORAL_TABLET | Freq: Every day | ORAL | 5 refills | Status: AC

## 2020-02-17 MED ORDER — MODAFINIL 200 MG PO TABS: 200.0000 mg | ORAL_TABLET | Freq: Every day | ORAL | 5 refills | Status: DC

## 2020-02-17 MED ORDER — BUDESONIDE 0.5 MG/2ML IN SUSP: 0.5000 mg | Freq: Two times a day (BID) | RESPIRATORY_TRACT | 11 refills | Status: AC

## 2020-02-17 MED ORDER — ALBUTEROL SULFATE (2.5 MG/3ML) 0.083% IN NEBU: 2.5000 mg | INHALATION_SOLUTION | Freq: Four times a day (QID) | RESPIRATORY_TRACT | 5 refills | Status: AC | PRN

## 2020-02-17 NOTE — Progress Notes (Signed)
[facility]  Chief Complaint  Patient presents with  . Follow-up    no complaints    Constitutional:  BP 114/62 (BP Location: Right Arm, Cuff Size: Normal)   Pulse 69   Temp (!) 97.5 F (36.4 C) (Other (Comment)) Comment (Src): wrist  Ht 4\' 11"  (1.499 m)   Wt 174 lb (78.9 kg)   SpO2 98% Comment: 3L Pulse O2  BMI 35.14 kg/m   Past Medical History:  DM, PNA, OA, Non-ischemic CM, Neuropathy, Gout, Colon polyps, HH, GERD, HLD, Fibromyalgia, Depression, Blind, Bipolar, A fib, Anxiety, Anemia  Past Surgical History:  Her  has a past surgical history that includes Heel spur surgery (Bilateral); Bladder suspension; bladder stimulator; Cataract extraction w/PHACO (Right, 11/29/2014); Cataract extraction w/PHACO (Left, 12/13/2014); I & D extremity (Right, 06/13/2015); Tubal ligation; Cervical disc surgery (N/A, 2009); Umbilical hernia repair; Appendectomy; Hernia repair; Esophagogastroduodenoscopy (egd) with propofol (N/A, 03/15/2016); Colonoscopy with propofol (N/A, 03/15/2016); and Cholecystectomy (N/A, 09/13/2016).  Brief Summary:  Denise Freeman is a 69 y.o. female former smoker with COPD, OSA, and chronic hypoxic/hypercapnic respiratory failure with OHS.      Subjective:   Breathing okay.  Gets occasional cough.  Uses Bipap.  Thinks she got a new machine about 3 yrs ago.  Using oxygen 24/7.  Isn't sure she wants COVID vaccine.  She heard a lot of weird things about the vaccine and wanted to learn more.  Physical Exam:   Appearance - well kempt, wearing oxygen  ENMT - no sinus tenderness, no oral exudate, no LAN, Mallampati 4 airway, no stridor  Respiratory - equal breath sounds bilaterally, no wheezing or rales  CV - s1s2 regular rate and rhythm, no murmurs  Ext - no clubbing, no edema  Skin - no rashes  Psych - normal mood and affect   Pulmonary testing:   PFT 10/24/14 >> FEV1 0.64 (30%), FEV1% 65, TLC 2.92 (64%), DLCO 54%, +  BD  Sleep Tests:   PSG 1991  Bipap 11/01/15 to 01/29/16 >>used on 89 of 90 nights with average 10 hrs 19 min. Average AHI 2.4 with Bipap 16/10 cm H2O  Cardiac Tests:   Echo 04/29/19 >> EF 50 to 55%, mild LVH, grade 2 DD, mod RV enlargement, mod RA/LA dilaition, mild elevation in PASP  Social History:  She  reports that she quit smoking about 30 years ago. Her smoking use included cigarettes. She has a 28.00 pack-year smoking history. She has never used smokeless tobacco. She reports that she does not drink alcohol and does not use drugs.  Family History:  Her family history includes Bipolar disorder in her brother; Breast cancer in her mother; COPD in her mother and sister; Cancer in her maternal grandfather and maternal grandmother; Diabetes in her mother and sister; Heart disease in her father, maternal grandmother, mother, and sister; Hyperlipidemia in her father.     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 - continue pulmicort, albuterol, ipratropium, singulair  Obstructive sleep apnea. - she is compliant with Bipap - uses [facility] for her DME - asked her to bring chip for her download - continue Bipap 16/10 cm H2O  Daytime sleepiness with OSA. - continue modafinil  Chronic respiratory failure 2nd to COPD and Obesity hypoventilation syndrome. - uses 3 liters 24/7  Permanent A fib, chronic systolic heart failure. - followed by [name] with [place] [facility] advice. - had lengthy discussion regarding COVID 19 vaccines  and cleared much of her misconceptions - she will plan to get Pfizer vaccine  Goals of Care. - DNR/DNI  Time Spent Involved in Patient Care on Day of Examination:  36 minutes  Follow up:  Patient Instructions  Follow up in 6 months   Medication List:   Allergies as of 02/17/2020      Reactions   Oseltamivir Hives   Other reaction(s): Other (see comments) "water blisters"   Xarelto [rivaroxaban] Other (See  Comments)   Internal bleeding   Ancef [cefazolin] Nausea And Vomiting   Levaquin [levofloxacin In D5w] Other (See Comments)   "afib"   Lyrica [pregabalin] Hives   Tamiflu [oseltamivir Phosphate] Other (See Comments)   "water blisters"   Zoloft [sertraline Hcl] Other (See Comments)   Jaw problems, jittery   Torsemide Other (See Comments)   Not effective   Augmentin [amoxicillin-pot Clavulanate] Itching   Ciprofloxacin Itching, Nausea And Vomiting   Haldol [haloperidol] Other (See Comments)   Restless leg   Nsaids Diarrhea   Penicillins Itching, Nausea And Vomiting, Rash   Has patient had a PCN reaction causing immediate rash, facial/tongue/throat swelling, SOB or lightheadedness with hypotension: Yes Has patient had a PCN reaction causing severe rash involving mucus membranes or skin necrosis: No Has patient had a PCN reaction that required hospitalization No Has patient had a PCN reaction occurring within the last 10 years: Yes If all of the above answers are "NO", then may proceed with Cephalosporin use.   Topamax [topiramate] Nausea Only      Medication List       Accurate as of February 17, 2020  1:57 PM. If you have any questions, ask your nurse or doctor.        albuterol (2.5 MG/3ML) 0.083% nebulizer solution Commonly known as: PROVENTIL Take 3 mLs (2.5 mg total) by nebulization every 6 (six) hours as needed for wheezing or shortness of breath. What changed: See the new instructions. Changed by: Chesley Mires, MD   allopurinol 100 MG tablet Commonly known as: ZYLOPRIM TAKE 1 TABLET BY MOUTH ONCE DAILY.   Basaglar KwikPen 100 UNIT/ML Inject 0.6 mLs (60 Units total) into the skin at bedtime. What changed:   how much to take  when to take this   budesonide 0.5 MG/2ML nebulizer solution Commonly known as: PULMICORT Take 2 mLs (0.5 mg total) by nebulization 2 (two) times daily.   cetirizine 10 MG tablet Commonly known as: ZYRTEC Take 1 tablet (10 mg total) by  mouth at bedtime.   cholecalciferol 10 MCG (400 UNIT) Tabs tablet Commonly known as: VITAMIN D3 Take 1 tablet (400 Units total) by mouth 2 (two) times daily.   clobetasol cream 0.05 % Commonly known as: TEMOVATE   colchicine 0.6 MG tablet Take 1 tablet (0.6 mg total) by mouth as needed.   escitalopram 10 MG tablet Commonly known as: LEXAPRO Take 1 tablet (10 mg total) by mouth at bedtime.   FeroSul 325 (65 FE) MG tablet Generic drug: ferrous sulfate Take 1 tablet by mouth daily.   fluticasone 50 MCG/ACT nasal spray Commonly known as: FLONASE Place 1 spray into both nostrils 2 (two) times daily.   furosemide 40 MG tablet Commonly known as: LASIX TAKE (2) TABLETS BY MOUTH THREE TIMES DAILY.   insulin lispro 100 UNIT/ML KiwkPen Commonly known as: HumaLOG KwikPen Inject 0.1-0.16 mLs (10-16 Units total) into the skin 3 (three) times daily before meals. What changed: how much to take   Insulin Pen Needle 32G X  4 MM Misc Use to inject insulin 5 times daily   ipratropium 0.02 % nebulizer solution Commonly known as: ATROVENT Take 2.5 mLs (0.5 mg total) by nebulization every 6 (six) hours as needed for wheezing or shortness of breath. What changed: See the new instructions. Changed by: Chesley Mires, MD   losartan 25 MG tablet Commonly known as: COZAAR TAKE (1/2) TABLET BY MOUTH ONCE DAILY.   metolazone 5 MG tablet Commonly known as: ZAROXOLYN Take 1 tablet (5 mg total) by mouth daily.   metoprolol succinate 25 MG 24 hr tablet Commonly known as: TOPROL-XL TAKE 1 TABLET BY MOUTH TWICE DAILY   mirtazapine 15 MG tablet Commonly known as: REMERON Take 1 tablet (15 mg total) by mouth at bedtime.   modafinil 200 MG tablet Commonly known as: PROVIGIL Take 1 tablet (200 mg total) by mouth daily.   mometasone 50 MCG/ACT nasal spray Commonly known as: NASONEX Place 1 spray into the nose daily.   montelukast 10 MG tablet Commonly known as: SINGULAIR Take 1 tablet (10 mg  total) by mouth at bedtime.   mupirocin cream 2 % Commonly known as: Bactroban Apply 1 application topically 2 (two) times daily.   nitroGLYCERIN 0.4 MG SL tablet Commonly known as: NITROSTAT Place 0.4 mg under the tongue every 5 (five) minutes as needed for chest pain.   nystatin powder Commonly known as: nystatin Apply topically 2 (two) times daily. Apply under breasts   ondansetron 4 MG tablet Commonly known as: ZOFRAN Take 4 mg by mouth once.   oxyCODONE-acetaminophen 5-325 MG tablet Commonly known as: PERCOCET/ROXICET Take 1 tablet by mouth 2 (two) times daily as needed for severe pain.   pantoprazole 20 MG tablet Commonly known as: PROTONIX TAKE (1) TABLET BY MOUTH TWICE A DAY BEFORE MEALS. (BREAKFAST AND SUPPER)   potassium chloride SA 20 MEQ tablet Commonly known as: KLOR-CON Take 20 mEq by mouth 2 (two) times daily.   potassium chloride SA 20 MEQ tablet Commonly known as: KLOR-CON TAKE 1 TABLET BY MOUTH TWICE DAILY   primidone 50 MG tablet Commonly known as: MYSOLINE TAKE ONE TABLET BY MOUTH AT BEDTIME.   PROBIOTIC PO Take 1 tablet by mouth at bedtime.   TART CHERRY ADVANCED PO Take by mouth daily.   triamcinolone lotion 0.1 % Commonly known as: KENALOG Apply 1 application topically 3 (three) times daily. For up to 14 days and as needed   True Metrix Blood Glucose Test test strip Generic drug: glucose blood USE TO TEST BLOOD SUGAR AS DIRECTED.   Vitamin A 2400 MCG (8000 UT) Caps Take 8,000 Units by mouth every morning.   warfarin 4 MG tablet Commonly known as: Coumadin Take as directed by the anticoagulation clinic. If you are unsure how to take this medication, talk to your nurse or doctor. Original instructions: Take 1 tablet by mouth every day except for 1/2 tablet on Sunday or as directed       Signature:  Chesley Mires, MD [place] Pager - [phone] 02/17/2020, 1:57 PM

## 2020-02-17 NOTE — Patient Instructions (Signed)
Follow up in 6 months 

## 2020-02-17 NOTE — Patient Outreach (Signed)
[facility]) Care Management  [place]  02/17/2020   PRIM MORACE [birth year] [id number]  Telephone Assessment  Chronic care management  Care manager reassigned on 01/20/20.    Per Chart Review Patient is a 69 year old female with PMHX: Combined Systolic and Diastolic Heart Failure, Chronic hypoxic respiratory failure, chronic oxygen therapy, OSA with Bipap, Chronic pain syndrome, hypertension, Diabetes ( A1c 13.9% on 11/03/19 per [facility] ) Depression with anxiety, partially blind both eyes, hard of hearing .   Recent Hospital Admissions: 04/27/19 Acute on Chronic congestive heart failure.   Subjective:  Follow up call to patient , she discussed visit with pulmonary MD on today and reports next visit in 6 months. She states MD was able to answer her questions about the Covid vaccine and she and her husband plan to get vaccine now.  Patient discussed that her weight is staying around  170 and she has no had any sudden weight gain or swelling, she continues to wear her oxygen at 3 liters.  Discussed patient blood sugars reports that she has received her sensors in the mail and has appointment at PCP office to help place as she usually does. Patient discussed that she doesn't believe that her blood sugar meter is accurate with getting reading at times at 300, she states that her husband helps her with reading them, she reports having a talking meter in the past, this current meter she is using she states is pretty new. She reports that she is doing pretty good with her diet and taking her insulin and has been told that her blood sugar level was doing better.   Encounter Medications:  Outpatient Encounter Medications as of 02/17/2020  Medication Sig Note  . albuterol (PROVENTIL) (2.5 MG/3ML) 0.083% nebulizer solution Take 3 mLs (2.5 mg total) by nebulization every 6 (six) hours as needed for wheezing or shortness of breath.   . allopurinol (ZYLOPRIM) 100 MG tablet TAKE 1  TABLET BY MOUTH ONCE DAILY.   . budesonide (PULMICORT) 0.5 MG/2ML nebulizer solution Take 2 mLs (0.5 mg total) by nebulization 2 (two) times daily.   . cetirizine (ZYRTEC) 10 MG tablet Take 1 tablet (10 mg total) by mouth at bedtime.   . cholecalciferol (VITAMIN D) 400 units TABS tablet Take 1 tablet (400 Units total) by mouth 2 (two) times daily.   . clobetasol cream (TEMOVATE) 0.05 %    . colchicine 0.6 MG tablet Take 1 tablet (0.6 mg total) by mouth as needed.   Marland Kitchen escitalopram (LEXAPRO) 10 MG tablet Take 1 tablet (10 mg total) by mouth at bedtime.   . FEROSUL 325 (65 Fe) MG tablet Take 1 tablet by mouth daily.   . fluticasone (FLONASE) 50 MCG/ACT nasal spray Place 1 spray into both nostrils 2 (two) times daily.   . furosemide (LASIX) 40 MG tablet TAKE (2) TABLETS BY MOUTH THREE TIMES DAILY.   Marland Kitchen Insulin Glargine (BASAGLAR KWIKPEN) 100 UNIT/ML SOPN Inject 0.6 mLs (60 Units total) into the skin at bedtime. (Patient taking differently: Inject 40 Units into the skin 2 (two) times daily. )   . insulin lispro (HUMALOG KWIKPEN) 100 UNIT/ML KiwkPen Inject 0.1-0.16 mLs (10-16 Units total) into the skin 3 (three) times daily before meals. (Patient taking differently: Inject 16-18 Units into the skin 3 (three) times daily before meals. )   . Insulin Pen Needle 32G X 4 MM MISC Use to inject insulin 5 times daily   . ipratropium (ATROVENT) 0.02 % nebulizer  solution Take 2.5 mLs (0.5 mg total) by nebulization every 6 (six) hours as needed for wheezing or shortness of breath.   . losartan (COZAAR) 25 MG tablet TAKE (1/2) TABLET BY MOUTH ONCE DAILY.   . metolazone (ZAROXOLYN) 5 MG tablet Take 1 tablet (5 mg total) by mouth daily.   . metoprolol succinate (TOPROL-XL) 25 MG 24 hr tablet TAKE 1 TABLET BY MOUTH TWICE DAILY   . mirtazapine (REMERON) 15 MG tablet Take 1 tablet (15 mg total) by mouth at bedtime.   . Misc Natural Products (TART CHERRY ADVANCED PO) Take by mouth daily.   . modafinil (PROVIGIL) 200 MG  tablet Take 1 tablet (200 mg total) by mouth daily.   . mometasone (NASONEX) 50 MCG/ACT nasal spray Place 1 spray into the nose daily.   . montelukast (SINGULAIR) 10 MG tablet Take 1 tablet (10 mg total) by mouth at bedtime.   . mupirocin cream (BACTROBAN) 2 % Apply 1 application topically 2 (two) times daily.   . nitroGLYCERIN (NITROSTAT) 0.4 MG SL tablet Place 0.4 mg under the tongue every 5 (five) minutes as needed for chest pain.  05/10/2019: States she has not taken, not needed, will need new refill, and she will follow up with MD.   . nystatin (NYSTATIN) powder Apply topically 2 (two) times daily. Apply under breasts   . ondansetron (ZOFRAN) 4 MG tablet Take 4 mg by mouth once.   Marland Kitchen oxyCODONE-acetaminophen (PERCOCET/ROXICET) 5-325 MG tablet Take 1 tablet by mouth 2 (two) times daily as needed for severe pain. 01/29/2018: Patient states that she take 7.5 mg tid   . pantoprazole (PROTONIX) 20 MG tablet TAKE (1) TABLET BY MOUTH TWICE A DAY BEFORE MEALS. (BREAKFAST AND SUPPER)   . potassium chloride SA (KLOR-CON) 20 MEQ tablet Take 20 mEq by mouth 2 (two) times daily.  2/72/5366: Duplicate   . potassium chloride SA (KLOR-CON) 20 MEQ tablet TAKE 1 TABLET BY MOUTH TWICE DAILY   . primidone (MYSOLINE) 50 MG tablet TAKE ONE TABLET BY MOUTH AT BEDTIME.   . Probiotic Product (PROBIOTIC PO) Take 1 tablet by mouth at bedtime.   . triamcinolone lotion (KENALOG) 0.1 % Apply 1 application topically 3 (three) times daily. For up to 14 days and as needed   . TRUE METRIX BLOOD GLUCOSE TEST test strip USE TO TEST BLOOD SUGAR AS DIRECTED.   . Vitamin A 2400 MCG (8000 UT) CAPS Take 8,000 Units by mouth every morning.    . warfarin (COUMADIN) 4 MG tablet Take 1 tablet by mouth every day except for 1/2 tablet on Sunday or as directed   . [DISCONTINUED] potassium chloride 20 MEQ TBCR Take 20 mEq by mouth 2 (two) times daily.    No facility-administered encounter medications on file as of 02/17/2020.    Functional  Status:  In your present state of health, do you have any difficulty performing the following activities: 05/17/2019 05/10/2019  Hearing? Y Y  Comment Hard of hearing. -  Vision? Y Y  Comment Legally blind in both eyes. -  Difficulty concentrating or making decisions? N N  Walking or climbing stairs? Y Y  Comment Difficulty navigating around, due to being legally blind.  At moderate risk for falls. -  Dressing or bathing? Y Y  Comment Becomes short-of-breath and is unsteady on her feet. States due to her breathing  Doing errands, shopping? Y Y  Comment Unable to drive due to being legally blind. -  Preparing Food and eating ? N  N  Using the Toilet? N N  In the past six months, have you accidently leaked urine? N N  Do you have problems with loss of bowel control? N N  Managing your Medications? N N  Managing your Finances? N N  Housekeeping or managing your Housekeeping? Y Y  Some recent data might be hidden    Fall/Depression Screening: Fall Risk  02/01/2020 06/11/2019 05/17/2019  Falls in the past year? 1 1 1   Number falls in past yr: 0 1 1  Injury with Fall? 0 1 1  Comment - - -  Risk Factor Category  - - -  Comment - - -  Risk for fall due to : History of fall(s);Impaired balance/gait;Impaired vision History of fall(s) History of fall(s);Impaired vision;Impaired mobility  Risk for fall due to: Comment - - -  Follow up Falls prevention discussed Falls evaluation completed;Education provided Education provided;Falls prevention discussed   PHQ 2/9 Scores 05/17/2019 11/28/2017 11/27/2017 10/03/2017 09/15/2017 09/05/2017 08/07/2017  PHQ - 2 Score 0 0 5 6 0 0 0  PHQ- 9 Score - - - 23 - - -    Assessment:   Diabetes Patient now has sensor and appointment to have placed, concern for accuracy of current finger sticks, she has declined follow up on getting a new talking meter. Will benefit from ongoing education and support on Diabetes self care management , reinforced notifying MD of  consistent elevations in blood sugar over 200's , she denies low blood sugar readings . Encouraged to take meter with her to MD office on tomorrow.    COPD Recent visit with pulmonary, she has scheduled Covid vaccine for 9/22. Heart failure No worsening symptoms, no sudden weight gain,  will benefit from ongoing education and support.     Plan:  Will plan follow up call in the next month for continued assessment of care needs.    Joylene Draft, RN, BSN  [facility] Management Coordinator  [phone]- Mobile [phone]- Toll Free Main Office

## 2020-02-29 ENCOUNTER — Ambulatory Visit: Payer: Medicare Other | Admitting: *Deleted

## 2020-03-02 ENCOUNTER — Ambulatory Visit (INDEPENDENT_AMBULATORY_CARE_PROVIDER_SITE_OTHER): Payer: Medicare Other | Admitting: *Deleted

## 2020-03-02 DIAGNOSIS — I4891 Unspecified atrial fibrillation: Secondary | ICD-10-CM | POA: Diagnosis not present

## 2020-03-02 DIAGNOSIS — Z5181 Encounter for therapeutic drug level monitoring: Secondary | ICD-10-CM | POA: Diagnosis not present

## 2020-03-02 LAB — POCT INR: INR: 1.5 — AB (ref 2.0–3.0)

## 2020-03-02 MED ORDER — WARFARIN SODIUM 4 MG PO TABS: ORAL_TABLET | ORAL | 2 refills | Status: DC

## 2020-03-02 NOTE — Patient Instructions (Signed)
Take warfarin 2 tablets tonight, 1 1/2 tablets tomorrow night then increase dose to 1 tablet daily Recheck in 3 weeks in the office.

## 2020-03-03 DIAGNOSIS — Z23 Encounter for immunization: Secondary | ICD-10-CM | POA: Diagnosis not present

## 2020-03-08 ENCOUNTER — Other Ambulatory Visit: Payer: Self-pay | Admitting: Internal Medicine

## 2020-03-09 ENCOUNTER — Other Ambulatory Visit: Payer: Self-pay | Admitting: *Deleted

## 2020-03-09 NOTE — Patient Outreach (Signed)
[facility]) Care Management  [place]  03/09/2020   Denise Freeman 04-May-1951 [id number]  Telephone Assessment   Telephone Assessment Chronic care management Care manager reassigned on 01/20/20.    Per Chart Review Patient is a 69 year old female with PMHX: Combined Systolic and Diastolic Heart Failure, Chronic hypoxic respiratory failure, chronic oxygen therapy, OSA with Bipap, Chronic pain syndrome, hypertension, Diabetes( A1c 13.9% on 11/03/19 per [facility] )Depression with anxiety, partially blind both eyes, hard of hearing .   Recent Hospital Admissions: 04/27/19 Acute on Chronic congestive heart failure.  Subjective: Unsuccessful outreach call to patient, no answer able to leave a HIPAA compliant message for return call.   Plan Will plan return call in the next 4 business days/and or  per work flow.   Joylene Draft, RN, BSN  [place] Management Coordinator  [phone]- Mobile [phone]- Toll Free Main Office

## 2020-03-14 ENCOUNTER — Other Ambulatory Visit: Payer: Self-pay | Admitting: *Deleted

## 2020-03-14 NOTE — Patient Outreach (Signed)
Referral from Joylene Draft, RN to [email or web address] for medication assistance.

## 2020-03-14 NOTE — Patient Outreach (Signed)
[name]) Care Management  [facility]  03/14/2020   Denise Freeman 1950/12/16 [id number]   Telephone Assessment Chronic care management Care manager reassigned on 01/20/20.     Patient is a 69 year old female with PMHX: Combined Systolic and Diastolic Heart Failure, Chronic hypoxic respiratory failure, chronic oxygen therapy, OSA with Bipap, Chronic pain syndrome, hypertension, Diabetes( A1c 13.9% on 11/03/19 per [facility] )Depression with anxiety, partially blind both eyes, hard of hearing .    Subjective:  Successful outreach call to patient , she reports that she is doing fairly well. She was pleased to discuss that she has received her initial covid vaccine and 2nd vaccine scheduled.  Patient states that her breathing is about the same no worse.  She discussed continuing to monitor her daily weights and denies increase in swelling, or sudden weight gain.  Patient discussed that she has not been to PCP office as usual to have sensor placed to scan her blood sugar she discussed having delay due to getting covid vaccine. She reports plans to to office on 10/13 to sensor placed , as she get covid vaccine the next week and she does not want that to interfere with the arm she gets vaccine in. Patient reports that she continues to check blood sugars at home with readings at times up to 400, unable to states recent reading on today, She discussed that she doesn't think the meter is reading correctly states that scanning works better for her.       Encounter Medications:  Outpatient Encounter Medications as of 03/14/2020  Medication Sig Note  . albuterol (PROVENTIL) (2.5 MG/3ML) 0.083% nebulizer solution Take 3 mLs (2.5 mg total) by nebulization every 6 (six) hours as needed for wheezing or shortness of breath.   . allopurinol (ZYLOPRIM) 100 MG tablet TAKE 1 TABLET BY MOUTH ONCE DAILY.   . budesonide (PULMICORT) 0.5 MG/2ML nebulizer solution Take 2 mLs (0.5 mg  total) by nebulization 2 (two) times daily.   . cetirizine (ZYRTEC) 10 MG tablet Take 1 tablet (10 mg total) by mouth at bedtime.   . cholecalciferol (VITAMIN D) 400 units TABS tablet Take 1 tablet (400 Units total) by mouth 2 (two) times daily.   . clobetasol cream (TEMOVATE) 0.05 %    . colchicine 0.6 MG tablet Take 1 tablet (0.6 mg total) by mouth as needed.   Marland Kitchen escitalopram (LEXAPRO) 10 MG tablet Take 1 tablet (10 mg total) by mouth at bedtime.   . FEROSUL 325 (65 Fe) MG tablet Take 1 tablet by mouth daily.   . fluticasone (FLONASE) 50 MCG/ACT nasal spray Place 1 spray into both nostrils 2 (two) times daily.   . furosemide (LASIX) 40 MG tablet TAKE (2) TABLETS BY MOUTH THREE TIMES DAILY.   Marland Kitchen Insulin Glargine (BASAGLAR KWIKPEN) 100 UNIT/ML SOPN Inject 0.6 mLs (60 Units total) into the skin at bedtime. (Patient taking differently: Inject 40 Units into the skin 2 (two) times daily. )   . insulin lispro (HUMALOG KWIKPEN) 100 UNIT/ML KiwkPen Inject 0.1-0.16 mLs (10-16 Units total) into the skin 3 (three) times daily before meals. (Patient taking differently: Inject 16-18 Units into the skin 3 (three) times daily before meals. )   . Insulin Pen Needle 32G X 4 MM MISC Use to inject insulin 5 times daily   . ipratropium (ATROVENT) 0.02 % nebulizer solution Take 2.5 mLs (0.5 mg total) by nebulization every 6 (six) hours as needed for wheezing or shortness of breath.   Marland Kitchen  losartan (COZAAR) 25 MG tablet TAKE (1/2) TABLET BY MOUTH ONCE DAILY.   . metolazone (ZAROXOLYN) 5 MG tablet Take 1 tablet (5 mg total) by mouth daily.   . metoprolol succinate (TOPROL-XL) 25 MG 24 hr tablet TAKE 1 TABLET BY MOUTH TWICE DAILY   . mirtazapine (REMERON) 15 MG tablet Take 1 tablet (15 mg total) by mouth at bedtime.   . Misc Natural Products (TART CHERRY ADVANCED PO) Take by mouth daily.   . modafinil (PROVIGIL) 200 MG tablet Take 1 tablet (200 mg total) by mouth daily.   . mometasone (NASONEX) 50 MCG/ACT nasal spray Place  1 spray into the nose daily.   . montelukast (SINGULAIR) 10 MG tablet Take 1 tablet (10 mg total) by mouth at bedtime.   . mupirocin cream (BACTROBAN) 2 % Apply 1 application topically 2 (two) times daily.   . nitroGLYCERIN (NITROSTAT) 0.4 MG SL tablet Place 0.4 mg under the tongue every 5 (five) minutes as needed for chest pain.  05/10/2019: States she has not taken, not needed, will need new refill, and she will follow up with MD.   . nystatin (NYSTATIN) powder Apply topically 2 (two) times daily. Apply under breasts   . ondansetron (ZOFRAN) 4 MG tablet Take 4 mg by mouth once.   Marland Kitchen oxyCODONE-acetaminophen (PERCOCET/ROXICET) 5-325 MG tablet Take 1 tablet by mouth 2 (two) times daily as needed for severe pain. 01/29/2018: Patient states that she take 7.5 mg tid   . pantoprazole (PROTONIX) 20 MG tablet TAKE (1) TABLET BY MOUTH TWICE A DAY BEFORE MEALS. (BREAKFAST AND SUPPER)   . potassium chloride SA (KLOR-CON) 20 MEQ tablet Take 20 mEq by mouth 2 (two) times daily.  06/03/7508: Duplicate   . potassium chloride SA (KLOR-CON) 20 MEQ tablet TAKE 1 TABLET BY MOUTH TWICE DAILY   . primidone (MYSOLINE) 50 MG tablet TAKE ONE TABLET BY MOUTH AT BEDTIME.   . Probiotic Product (PROBIOTIC PO) Take 1 tablet by mouth at bedtime.   . triamcinolone lotion (KENALOG) 0.1 % Apply 1 application topically 3 (three) times daily. For up to 14 days and as needed   . TRUE METRIX BLOOD GLUCOSE TEST test strip USE TO TEST BLOOD SUGAR AS DIRECTED.   . Vitamin A 2400 MCG (8000 UT) CAPS Take 8,000 Units by mouth every morning.    . warfarin (COUMADIN) 4 MG tablet Take 2 tablets tonight, 1 1/2 tablets tomorrow night then increase dose to 1 tablet daily   . [DISCONTINUED] potassium chloride 20 MEQ TBCR Take 20 mEq by mouth 2 (two) times daily.    No facility-administered encounter medications on file as of 03/14/2020.    Functional Status:  In your present state of health, do you have any difficulty performing the following  activities: 03/14/2020 05/17/2019  Hearing? Y Y  Comment - Hard of hearing.  Vision? Y Y  Comment - Legally blind in both eyes.  Difficulty concentrating or making decisions? N N  Walking or climbing stairs? [name]  Comment uses assistive device, no climbing stairs Difficulty navigating around, due to being legally blind.  At moderate risk for falls.  Dressing or bathing? [name]  Comment has to take her time due to breathing problem Becomes short-of-breath and is unsteady on her feet.  Doing errands, shopping? [name]  Comment husband helps with transportation Unable to drive due to being legally blind.  Preparing Food and eating ? N N  Comment husband helps -  Using the Toilet? N N  In the past six months, have you accidently leaked urine? N N  Do you have problems with loss of bowel control? N N  Managing your Medications? N N  Managing your Finances? N N  Comment husband helps -  Housekeeping or managing your Housekeeping? [name]  Comment spouse assist -  Some recent data might be hidden    Fall/Depression Screening: Fall Risk  02/01/2020 06/11/2019 05/17/2019  Falls in the past year? 1 1 1   Number falls in past yr: 0 1 1  Injury with Fall? 0 1 1  Comment - - -  Risk Factor Category  - - -  Comment - - -  Risk for fall due to : History of fall(s);Impaired balance/gait;Impaired vision History of fall(s) History of fall(s);Impaired vision;Impaired mobility  Risk for fall due to: Comment - - -  Follow up Falls prevention discussed Falls evaluation completed;Education provided Education provided;Falls prevention discussed   PHQ 2/9 Scores 05/17/2019 11/28/2017 11/27/2017 10/03/2017 09/15/2017 09/05/2017 08/07/2017  PHQ - 2 Score 0 0 5 6 0 0 0  PHQ- 9 Score - - - 23 - - -    Assessment:  Goals Addressed            This Visit's Progress   . Manage My Medicine       Follow Up Date 04/06/20   - call for medicine refill 2 or 3 days before it runs out    Why is this important?   These steps  will help you keep on track with your medicines.    Notes:  Pharmacy referral due to cost of insulins    . Monitor and Manage My Blood Sugar       Follow Up Date 04/04/20   - check blood sugar at prescribed times - check blood sugar if I feel it is too high or too low - take the blood sugar meter to all doctor visits    Why is this important?   Checking your blood sugar at home helps to keep it from getting very high or very low.  Writing the results in a diary or log helps the doctor know how to care for you.  Your blood sugar log should have the time, date and the results.  Also, write down the amount of insulin or other medicine that you take.  Other information, like what you ate, exercise done and how you were feeling, will also be helpful.     Notes:     . Obtain Eye Exam       Follow Up Date11/2/21   - keep appointment with eye doctor    Why is this important?   Eye check-ups are important when you have diabetes.  Vision loss can be prevented.    Notes:     . Perform Foot Care       Follow Up Date 04/04/20   - check feet daily for cuts, sores or redness - wash and dry feet carefully every day - wear comfortable, cotton socks - wear comfortable, well-fitting shoes    Why is this important?   Good foot care is very important when you have diabetes.  There are many things you can do to keep your feet healthy and catch a problem early.    Notes:  Encourage spouse to assist with observance of feet and with care     . Set My Target A1C       Follow Up Date 04/04/20   -  set target A1C    Why is this important?   Your target A1C is decided together by you and your doctor.  It is based on several things like your age and other health issues.    Notes:  Call to PCP office regarding Ac1    . Track and Manage Activity and Exertion       Follow Up Date 04/04/20   - pace activity allowing for rest    Why is this important?   Exercising is very important when  managing your heart failure.  It will help your heart get stronger.    Notes:     . Track and Manage Fluids and Swelling       Follow Up Date 04/04/20   - call office if I gain more than 2 pounds in one day or 5 pounds in one week - do ankle pumps when sitting - keep legs up while sitting - track weight in diary - watch for swelling in feet, ankles and legs every day - weigh myself daily    Why is this important?   It is important to check your weight daily and watch how much salt and liquids you have.  It will help you to manage your heart failure.    Notes:     . Track and Manage Symptoms       Follow Up Date 04/04/20   - bring diary to all appointments - follow rescue plan if symptoms flare-up - know when to call the doctor - dress right for the weather, hot or cold    Why is this important?   You will be able to handle your symptoms better if you keep track of them.  Making some simple changes to your lifestyle will help.  Eating healthy is one thing you can do to take good care of yourself.    Notes:       Medications  Patient discussed cost of insulin pens is a burden at times, she discussed trying to get in touch with [employer] but have been unsuccessful.  Diabetes Patient with continued reported high blood sugar check with finger sticks, Placed call to PCP office to report and inquire regarding  A1c. Patient able to state insulin doses and that she has current medications as taking as prescribed, she discussed due to vision she  listens to clicks of pen to determine how much insulin due, she is taking as well as having spouse at times view. Patient declined follow up on getting talking blood sugar meter at this time.   Plan:  Placed call to [name], NP office able to leave a message regarding concern of patient report of elevated blood sugars, inquire regarding A1c and patient Diabetes management . [name] returned call at to explain above concerns,  she reports patient has A1c testing scheduled in November and verifies patient comes to office to have sensor placed for [place].  Will place pharmacy referral due to patient concern for cost of insulin pens.  Will plan follow up call in the next 3 weeks.    Joylene Draft, RN, BSN  [place] Management Coordinator  [phone] Mobile [phone]- Toll Free Main Office

## 2020-03-15 ENCOUNTER — Encounter (INDEPENDENT_AMBULATORY_CARE_PROVIDER_SITE_OTHER): Payer: Medicare Other | Admitting: Ophthalmology

## 2020-03-15 DIAGNOSIS — H53419 Scotoma involving central area, unspecified eye: Secondary | ICD-10-CM | POA: Insufficient documentation

## 2020-03-15 DIAGNOSIS — H5316 Psychophysical visual disturbances: Secondary | ICD-10-CM | POA: Insufficient documentation

## 2020-03-15 DIAGNOSIS — H43822 Vitreomacular adhesion, left eye: Secondary | ICD-10-CM | POA: Insufficient documentation

## 2020-03-15 DIAGNOSIS — H43821 Vitreomacular adhesion, right eye: Secondary | ICD-10-CM | POA: Insufficient documentation

## 2020-03-15 DIAGNOSIS — Z961 Presence of intraocular lens: Secondary | ICD-10-CM | POA: Insufficient documentation

## 2020-03-15 DIAGNOSIS — H47619 Cortical blindness, unspecified side of brain: Secondary | ICD-10-CM | POA: Insufficient documentation

## 2020-03-20 ENCOUNTER — Other Ambulatory Visit: Payer: Self-pay

## 2020-03-20 ENCOUNTER — Telehealth: Payer: Self-pay | Admitting: Pulmonary Disease

## 2020-03-20 ENCOUNTER — Emergency Department (HOSPITAL_COMMUNITY): Payer: Medicare Other

## 2020-03-20 ENCOUNTER — Encounter (HOSPITAL_COMMUNITY): Payer: Self-pay | Admitting: Emergency Medicine

## 2020-03-20 ENCOUNTER — Inpatient Hospital Stay (HOSPITAL_COMMUNITY)
Admission: EM | Admit: 2020-03-20 | Discharge: 2020-03-22 | DRG: 638 | Discharge status: Against Medical Advice | Payer: Medicare Other | Attending: Internal Medicine | Admitting: Internal Medicine

## 2020-03-20 ENCOUNTER — Inpatient Hospital Stay (HOSPITAL_COMMUNITY): Payer: Medicare Other

## 2020-03-20 DIAGNOSIS — Z83438 Family history of other disorder of lipoprotein metabolism and other lipidemia: Secondary | ICD-10-CM

## 2020-03-20 DIAGNOSIS — I130 Hypertensive heart and chronic kidney disease with heart failure and stage 1 through stage 4 chronic kidney disease, or unspecified chronic kidney disease: Secondary | ICD-10-CM | POA: Diagnosis present

## 2020-03-20 DIAGNOSIS — E6609 Other obesity due to excess calories: Secondary | ICD-10-CM

## 2020-03-20 DIAGNOSIS — J9610 Chronic respiratory failure, unspecified whether with hypoxia or hypercapnia: Secondary | ICD-10-CM | POA: Diagnosis present

## 2020-03-20 DIAGNOSIS — Z881 Allergy status to other antibiotic agents status: Secondary | ICD-10-CM

## 2020-03-20 DIAGNOSIS — I5022 Chronic systolic (congestive) heart failure: Secondary | ICD-10-CM | POA: Diagnosis present

## 2020-03-20 DIAGNOSIS — E876 Hypokalemia: Secondary | ICD-10-CM | POA: Diagnosis not present

## 2020-03-20 DIAGNOSIS — G4733 Obstructive sleep apnea (adult) (pediatric): Secondary | ICD-10-CM | POA: Diagnosis present

## 2020-03-20 DIAGNOSIS — J9612 Chronic respiratory failure with hypercapnia: Secondary | ICD-10-CM | POA: Diagnosis present

## 2020-03-20 DIAGNOSIS — I480 Paroxysmal atrial fibrillation: Secondary | ICD-10-CM | POA: Diagnosis present

## 2020-03-20 DIAGNOSIS — H547 Unspecified visual loss: Secondary | ICD-10-CM | POA: Diagnosis present

## 2020-03-20 DIAGNOSIS — Z9981 Dependence on supplemental oxygen: Secondary | ICD-10-CM | POA: Diagnosis not present

## 2020-03-20 DIAGNOSIS — Z7901 Long term (current) use of anticoagulants: Secondary | ICD-10-CM

## 2020-03-20 DIAGNOSIS — E1122 Type 2 diabetes mellitus with diabetic chronic kidney disease: Secondary | ICD-10-CM | POA: Diagnosis not present

## 2020-03-20 DIAGNOSIS — E1165 Type 2 diabetes mellitus with hyperglycemia: Secondary | ICD-10-CM | POA: Diagnosis not present

## 2020-03-20 DIAGNOSIS — M109 Gout, unspecified: Secondary | ICD-10-CM | POA: Diagnosis present

## 2020-03-20 DIAGNOSIS — Z20822 Contact with and (suspected) exposure to covid-19: Secondary | ICD-10-CM | POA: Diagnosis present

## 2020-03-20 DIAGNOSIS — R059 Cough, unspecified: Secondary | ICD-10-CM

## 2020-03-20 DIAGNOSIS — Z6834 Body mass index (BMI) 34.0-34.9, adult: Secondary | ICD-10-CM | POA: Diagnosis not present

## 2020-03-20 DIAGNOSIS — G8929 Other chronic pain: Secondary | ICD-10-CM | POA: Diagnosis present

## 2020-03-20 DIAGNOSIS — I4891 Unspecified atrial fibrillation: Secondary | ICD-10-CM | POA: Diagnosis present

## 2020-03-20 DIAGNOSIS — J449 Chronic obstructive pulmonary disease, unspecified: Secondary | ICD-10-CM | POA: Diagnosis present

## 2020-03-20 DIAGNOSIS — E860 Dehydration: Secondary | ICD-10-CM | POA: Diagnosis present

## 2020-03-20 DIAGNOSIS — E559 Vitamin D deficiency, unspecified: Secondary | ICD-10-CM | POA: Diagnosis present

## 2020-03-20 DIAGNOSIS — R739 Hyperglycemia, unspecified: Secondary | ICD-10-CM | POA: Diagnosis present

## 2020-03-20 DIAGNOSIS — R2681 Unsteadiness on feet: Secondary | ICD-10-CM | POA: Diagnosis not present

## 2020-03-20 DIAGNOSIS — Z880 Allergy status to penicillin: Secondary | ICD-10-CM

## 2020-03-20 DIAGNOSIS — Z87891 Personal history of nicotine dependence: Secondary | ICD-10-CM | POA: Diagnosis not present

## 2020-03-20 DIAGNOSIS — E1100 Type 2 diabetes mellitus with hyperosmolarity without nonketotic hyperglycemic-hyperosmolar coma (NKHHC): Principal | ICD-10-CM

## 2020-03-20 DIAGNOSIS — Z794 Long term (current) use of insulin: Secondary | ICD-10-CM

## 2020-03-20 DIAGNOSIS — N179 Acute kidney failure, unspecified: Secondary | ICD-10-CM

## 2020-03-20 DIAGNOSIS — Z825 Family history of asthma and other chronic lower respiratory diseases: Secondary | ICD-10-CM

## 2020-03-20 DIAGNOSIS — I517 Cardiomegaly: Secondary | ICD-10-CM | POA: Diagnosis not present

## 2020-03-20 DIAGNOSIS — I5042 Chronic combined systolic (congestive) and diastolic (congestive) heart failure: Secondary | ICD-10-CM | POA: Diagnosis present

## 2020-03-20 DIAGNOSIS — N1832 Chronic kidney disease, stage 3b: Secondary | ICD-10-CM | POA: Diagnosis present

## 2020-03-20 DIAGNOSIS — Z79899 Other long term (current) drug therapy: Secondary | ICD-10-CM

## 2020-03-20 DIAGNOSIS — Z7951 Long term (current) use of inhaled steroids: Secondary | ICD-10-CM

## 2020-03-20 DIAGNOSIS — N189 Chronic kidney disease, unspecified: Secondary | ICD-10-CM | POA: Diagnosis present

## 2020-03-20 DIAGNOSIS — N183 Chronic kidney disease, stage 3 unspecified: Secondary | ICD-10-CM

## 2020-03-20 DIAGNOSIS — R42 Dizziness and giddiness: Secondary | ICD-10-CM | POA: Diagnosis not present

## 2020-03-20 DIAGNOSIS — Z8249 Family history of ischemic heart disease and other diseases of the circulatory system: Secondary | ICD-10-CM

## 2020-03-20 DIAGNOSIS — K219 Gastro-esophageal reflux disease without esophagitis: Secondary | ICD-10-CM | POA: Diagnosis present

## 2020-03-20 DIAGNOSIS — J4489 Other specified chronic obstructive pulmonary disease: Secondary | ICD-10-CM | POA: Diagnosis present

## 2020-03-20 DIAGNOSIS — J9611 Chronic respiratory failure with hypoxia: Secondary | ICD-10-CM | POA: Diagnosis present

## 2020-03-20 DIAGNOSIS — Z888 Allergy status to other drugs, medicaments and biological substances status: Secondary | ICD-10-CM

## 2020-03-20 DIAGNOSIS — Z043 Encounter for examination and observation following other accident: Secondary | ICD-10-CM | POA: Diagnosis not present

## 2020-03-20 DIAGNOSIS — Z833 Family history of diabetes mellitus: Secondary | ICD-10-CM

## 2020-03-20 LAB — GLUCOSE, CAPILLARY
Glucose-Capillary: 207 mg/dL — ABNORMAL HIGH (ref 70–99)
Glucose-Capillary: 322 mg/dL — ABNORMAL HIGH (ref 70–99)

## 2020-03-20 LAB — RESP PANEL BY RT PCR (RSV, FLU A&B, COVID)
Influenza A by PCR: NEGATIVE
Influenza B by PCR: NEGATIVE
Respiratory Syncytial Virus by PCR: NEGATIVE
SARS Coronavirus 2 by RT PCR: NEGATIVE

## 2020-03-20 LAB — BLOOD GAS, VENOUS
Acid-Base Excess: 16.9 mmol/L — ABNORMAL HIGH (ref 0.0–2.0)
Bicarbonate: 37.2 mmol/L — ABNORMAL HIGH (ref 20.0–28.0)
FIO2: 36
O2 Saturation: 40.7 %
Patient temperature: 36.8
pCO2, Ven: 75.6 mmHg (ref 44.0–60.0)
pH, Ven: 7.373 (ref 7.250–7.430)
pO2, Ven: <31 mmHg — CL (ref 32.0–45.0)

## 2020-03-20 LAB — DIFFERENTIAL
Abs Immature Granulocytes: 0.05 10*3/uL (ref 0.00–0.07)
Basophils Absolute: 0 10*3/uL (ref 0.0–0.1)
Basophils Relative: 0 %
Eosinophils Absolute: 0.1 10*3/uL (ref 0.0–0.5)
Eosinophils Relative: 1 %
Immature Granulocytes: 0 %
Lymphocytes Relative: 8 %
Lymphs Abs: 0.9 10*3/uL (ref 0.7–4.0)
Monocytes Absolute: 0.5 10*3/uL (ref 0.1–1.0)
Monocytes Relative: 4 %
Neutro Abs: 9.7 10*3/uL — ABNORMAL HIGH (ref 1.7–7.7)
Neutrophils Relative %: 87 %

## 2020-03-20 LAB — BASIC METABOLIC PANEL
Anion gap: 19 — ABNORMAL HIGH (ref 5–15)
Anion gap: >20 — ABNORMAL HIGH (ref 5–15)
BUN: 136 mg/dL — ABNORMAL HIGH (ref 8–23)
BUN: 136 mg/dL — ABNORMAL HIGH (ref 8–23)
CO2: 36 mmol/L — ABNORMAL HIGH (ref 22–32)
CO2: 32 mmol/L (ref 22–32)
Calcium: 8.5 mg/dL — ABNORMAL LOW (ref 8.9–10.3)
Calcium: 8.5 mg/dL — ABNORMAL LOW (ref 8.9–10.3)
Chloride: 64 mmol/L — CL (ref 98–111)
Chloride: 67 mmol/L — ABNORMAL LOW (ref 98–111)
Creatinine, Ser: 2.85 mg/dL — ABNORMAL HIGH (ref 0.44–1.00)
Creatinine, Ser: 2.73 mg/dL — ABNORMAL HIGH (ref 0.44–1.00)
GFR, Estimated: 16 mL/min — ABNORMAL LOW (ref >60)
GFR, Estimated: 17 mL/min — ABNORMAL LOW (ref >60)
Glucose, Bld: 761 mg/dL (ref 70–99)
Glucose, Bld: 739 mg/dL (ref 70–99)
Potassium: 3 mmol/L — ABNORMAL LOW (ref 3.5–5.1)
Potassium: 3.3 mmol/L — ABNORMAL LOW (ref 3.5–5.1)
Sodium: 119 mmol/L — CL (ref 135–145)
Sodium: 121 mmol/L — ABNORMAL LOW (ref 135–145)

## 2020-03-20 LAB — URINALYSIS, ROUTINE W REFLEX MICROSCOPIC
Bacteria, UA: NONE SEEN
Bilirubin Urine: NEGATIVE
Glucose, UA: >=500 mg/dL — AB
Ketones, ur: NEGATIVE mg/dL
Leukocytes,Ua: NEGATIVE
Nitrite: NEGATIVE
Protein, ur: NEGATIVE mg/dL
Specific Gravity, Urine: 1.01 (ref 1.005–1.030)
pH: 5 (ref 5.0–8.0)

## 2020-03-20 LAB — CBC
HCT: 36.2 % (ref 36.0–46.0)
Hemoglobin: 12.5 g/dL (ref 12.0–15.0)
MCH: 28.9 pg (ref 26.0–34.0)
MCHC: 34.5 g/dL (ref 30.0–36.0)
MCV: 83.6 fL (ref 80.0–100.0)
Platelets: 230 10*3/uL (ref 150–400)
RBC: 4.33 MIL/uL (ref 3.87–5.11)
RDW: 12.1 % (ref 11.5–15.5)
WBC: 11.5 10*3/uL — ABNORMAL HIGH (ref 4.0–10.5)
nRBC: 0 % (ref 0.0–0.2)

## 2020-03-20 LAB — CBG MONITORING, ED
Glucose-Capillary: >600 mg/dL (ref 70–99)
Glucose-Capillary: >600 mg/dL (ref 70–99)
Glucose-Capillary: 518 mg/dL (ref 70–99)
Glucose-Capillary: 528 mg/dL (ref 70–99)
Glucose-Capillary: 473 mg/dL — ABNORMAL HIGH (ref 70–99)

## 2020-03-20 LAB — BETA-HYDROXYBUTYRIC ACID: Beta-Hydroxybutyric Acid: 0.42 mmol/L — ABNORMAL HIGH (ref 0.05–0.27)

## 2020-03-20 LAB — MAGNESIUM: Magnesium: 2.1 mg/dL (ref 1.7–2.4)

## 2020-03-20 LAB — PROTIME-INR
INR: 2.1 — ABNORMAL HIGH (ref 0.8–1.2)
Prothrombin Time: 22.9 seconds — ABNORMAL HIGH (ref 11.4–15.2)

## 2020-03-20 MED ORDER — OXYCODONE-ACETAMINOPHEN 5-325 MG PO TABS
1.0000 | ORAL_TABLET | Freq: Once | ORAL | Status: AC
  Administered 2020-03-20: 1 via ORAL
  Filled 2020-03-20: qty 1

## 2020-03-20 MED ORDER — BUDESONIDE 0.5 MG/2ML IN SUSP: 0.5000 mg | Freq: Two times a day (BID) | RESPIRATORY_TRACT | Status: DC

## 2020-03-20 MED ORDER — IPRATROPIUM-ALBUTEROL 0.5-2.5 (3) MG/3ML IN SOLN: 3.0000 mL | RESPIRATORY_TRACT | Status: DC | PRN

## 2020-03-20 MED ORDER — LACTATED RINGERS IV SOLN: INTRAVENOUS | Status: DC

## 2020-03-20 MED ORDER — LACTATED RINGERS IV BOLUS
20.0000 mL/kg | Freq: Once | INTRAVENOUS | Status: AC
  Administered 2020-03-20: 1542 mL via INTRAVENOUS

## 2020-03-20 MED ORDER — INSULIN REGULAR(HUMAN) IN NACL 100-0.9 UT/100ML-% IV SOLN
INTRAVENOUS | Status: DC
  Administered 2020-03-20: 10.5 [IU]/h via INTRAVENOUS

## 2020-03-20 MED ORDER — DEXTROSE 50 % IV SOLN: 0.0000 mL | INTRAVENOUS | Status: DC | PRN

## 2020-03-20 MED ORDER — POTASSIUM CHLORIDE CRYS ER 20 MEQ PO TBCR
40.0000 meq | EXTENDED_RELEASE_TABLET | ORAL | Status: AC
  Administered 2020-03-20 (×2): 40 meq via ORAL
  Filled 2020-03-20 (×2): qty 2

## 2020-03-20 MED ORDER — POTASSIUM CHLORIDE 10 MEQ/100ML IV SOLN
10.0000 meq | INTRAVENOUS | Status: AC
  Administered 2020-03-20 (×3): 10 meq via INTRAVENOUS
  Filled 2020-03-20 (×4): qty 100

## 2020-03-20 MED ORDER — OXYCODONE-ACETAMINOPHEN 5-325 MG PO TABS
1.0000 | ORAL_TABLET | Freq: Two times a day (BID) | ORAL | Status: DC | PRN
  Administered 2020-03-21 (×2): 1 via ORAL
  Filled 2020-03-20 (×3): qty 1

## 2020-03-20 MED ORDER — DEXTROSE IN LACTATED RINGERS 5 % IV SOLN: INTRAVENOUS | Status: DC

## 2020-03-20 MED ORDER — IPRATROPIUM-ALBUTEROL 0.5-2.5 (3) MG/3ML IN SOLN
3.0000 mL | Freq: Once | RESPIRATORY_TRACT | Status: AC
  Administered 2020-03-20: 3 mL via RESPIRATORY_TRACT
  Filled 2020-03-20: qty 3

## 2020-03-20 MED ORDER — LACTATED RINGERS IV BOLUS
1000.0000 mL | Freq: Once | INTRAVENOUS | Status: AC
  Administered 2020-03-20: 1000 mL via INTRAVENOUS

## 2020-03-20 MED ORDER — BUDESONIDE 0.5 MG/2ML IN SUSP
0.5000 mg | Freq: Two times a day (BID) | RESPIRATORY_TRACT | Status: DC
  Administered 2020-03-21 – 2020-03-22 (×3): 0.5 mg via RESPIRATORY_TRACT
  Filled 2020-03-20 (×3): qty 2

## 2020-03-20 MED ORDER — INSULIN REGULAR(HUMAN) IN NACL 100-0.9 UT/100ML-% IV SOLN
INTRAVENOUS | Status: DC
  Administered 2020-03-20: 10 [IU]/h via INTRAVENOUS
  Filled 2020-03-20: qty 100

## 2020-03-20 MED ORDER — GUAIFENESIN-DM 100-10 MG/5ML PO SYRP
10.0000 mL | ORAL_SOLUTION | Freq: Four times a day (QID) | ORAL | Status: AC
  Administered 2020-03-20 – 2020-03-21 (×5): 10 mL via ORAL
  Filled 2020-03-20 (×5): qty 10

## 2020-03-20 NOTE — ED Notes (Signed)
Pt taken to CT at this time.

## 2020-03-20 NOTE — H&P (Addendum)
History and Physical    Denise Freeman HAL:[id number] DOB: Oct 29, 1950 DOA: 03/20/2020  PCP: [name], NP   Patient coming from: Home  I have personally briefly reviewed patient's old medical records in [place]  Chief Complaint: Dizziness  HPI: Denise Freeman is a 69 y.o. female with medical history significant for  DM2, atrial fibrillation, systolic and diastolic CHF, chronic respiratory failure on 3 L, COPD, CKD 3. Patient presented to the ED with complaints of dizziness intermittent since she got her first dose of covid vaccine last month September (pfizer).  Dizziness is present only when standing up, and she reports dizziness is present most days.  Patient reports she fell today, she did not hit her head, and she did not lose consciousness.  She did hit the left side of her keep against her drawer. Patient reports daily compliance with her insulins which include dose and sliding scale Humalog.  She last took her medications this morning, and tells me her blood sugar this morning was 150. She also reports compliance with her torsemide.  No vomiting no loose stools. She reports dry cough over the past 3 days.  She denies difficulty breathing.  No chest pain.  ED Course: temp 98.4,  Heart rate 50s to 70s, blood pressure 108 - 132, O2 sats 95% on 3 L, also documentation that patient O2 sats dropped to 87 to 88% on 3 L and increased to 92% on 4 L, also patient was was yelling that she cannot lay down as she felt like she was losing her air-patient tells me she was just uncomfortable while she was lying down flat. Blood glucose 761, potassium 3, sodium 119, creatinine elevated 2.85, WBC 11.5. Pelvic x-ray was without acute abnormality.  Head CT also unremarkable.  Patient was started on insulin drip, 2.5 L bolus of fluids given.  Hospitalist to admit for hyperglycemia and AKI.  Review of Systems: As per HPI all other systems reviewed and negative.  Past Medical History:    Diagnosis Date  . Allergy   . Anemia   . Anxiety   . Asthma   . Atrial fibrillation ([place])   . Bipolar 1 disorder ([place])   . 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 Cholelithiasis    a. 09/2016 s/p Lap Chole.  . Chronic bronchitis ([place])   . Chronic combined systolic and diastolic congestive heart failure ([place])    a. 09/2016 Echo: EF 30-35%.  . Colon polyps   . COPD (chronic obstructive pulmonary disease) ([place])   . Depression   . Family history of adverse reaction to anesthesia    Uncle was positive for malignant hyperthermia; patient had testing done and was negative.  . Fibromyalgia   . GERD (gastroesophageal reflux disease)   . Gout   . High cholesterol   . History of blood transfusion 06/2015   "bleeding from my rectum"  . History of hiatal hernia   . HOH (hard of hearing)   . Hx of colonic polyps 03/21/2016   3 small adenomas no recall - co-morbidities  . Neuropathy    Disc Back   . NICM (nonischemic cardiomyopathy) ([place])    a. Previously worked up in [place]-->low EF with subsequent recovery.  Multiple caths (last ~ 2014 per pt report)--reportedly nl cors;  b. 09/2016 Echo: EF 30-35%, antsept/apical HK, mild MR, mildly dil LA, mod dil RA;  c. 09/2016 Lexi MV: EF 26%, glob HK, sept DK, med size, mod  intensity fixed septal defect - BBB/PVC related artifact, no ischemia.  . On home oxygen therapy    "3L; 24/7" (03/14/2016)  . OSA treated with BiPAP    uses biPAP, 10 (03/14/2016)  . Osteoarthritis   . Oxygen deficiency   . Pneumonia   . Type II diabetes mellitus ([place])     Past Surgical History:  Procedure Laterality Date  . APPENDECTOMY     "they busted"  . bladder stimulator     pt states, "it cannot be turned off; it's in my right hip; dead battery so it's not working anymore". (03/14/2016)  . BLADDER SUSPENSION     2003, 2006 and 2010  . CATARACT EXTRACTION W/PHACO Right 11/29/2014   Procedure: CATARACT EXTRACTION PHACO AND INTRAOCULAR LENS PLACEMENT  (IOC);  Surgeon: Rutherford Guys, MD;  Location: [facility];  Service: Ophthalmology;  Laterality: Right;  CDE:3.81  . CATARACT EXTRACTION W/PHACO Left 12/13/2014   Procedure: CATARACT EXTRACTION PHACO AND INTRAOCULAR LENS PLACEMENT (IOC);  Surgeon: Rutherford Guys, MD;  Location: [facility];  Service: Ophthalmology;  Laterality: Left;  CDE:6.59  . CERVICAL DISC SURGERY N/A 2009   4, 6, and 7 cervical disc replaced  . CHOLECYSTECTOMY N/A 09/13/2016   Procedure: LAPAROSCOPIC CHOLECYSTECTOMY;  Surgeon: Rolm Bookbinder, MD;  Location: [facility];  Service: General;  Laterality: N/A;  . COLONOSCOPY WITH PROPOFOL N/A 03/15/2016   Procedure: COLONOSCOPY WITH PROPOFOL;  Surgeon: Gatha Mayer, MD;  Location: [facility];  Service: Endoscopy;  Laterality: N/A;  . ESOPHAGOGASTRODUODENOSCOPY (EGD) WITH PROPOFOL N/A 03/15/2016   Procedure: ESOPHAGOGASTRODUODENOSCOPY (EGD) WITH PROPOFOL;  Surgeon: Gatha Mayer, MD;  Location: [facility];  Service: Endoscopy;  Laterality: N/A;  . HEEL SPUR SURGERY Bilateral   . HERNIA REPAIR    . I & D EXTREMITY Right 06/13/2015   Procedure: MINOR IRRIGATION AND DEBRIDEMENT EXTREMITY REMOVAL OF NAIL;  Surgeon: Daryll Brod, MD;  Location: [facility];  Service: Orthopedics;  Laterality: Right;  . TUBAL LIGATION    . UMBILICAL HERNIA REPAIR     w/mesh     reports that she quit smoking about 30 years ago. Her smoking use included cigarettes. She has a 28.00 pack-year smoking history. She has never used smokeless tobacco. She reports that she does not drink alcohol and does not use drugs.  Allergies  Allergen Reactions  . Oseltamivir Hives    Other reaction(s): Other (see comments) "water blisters"  . Xarelto [Rivaroxaban] Other (See Comments)    Internal bleeding  . Ancef [Cefazolin] Nausea And Vomiting  . Levaquin [Levofloxacin In D5w] Other (See Comments)    "afib"  . Lyrica [Pregabalin] Hives  . Tamiflu [Oseltamivir Phosphate] Other (See Comments)    "water  blisters"  . Zoloft [Sertraline Hcl] Other (See Comments)    Jaw problems, jittery  . Torsemide Other (See Comments)    Not effective  . Augmentin [Amoxicillin-Pot Clavulanate] Itching  . Ciprofloxacin Itching and Nausea And Vomiting  . Haldol [Haloperidol] Other (See Comments)    Restless leg  . Nsaids Diarrhea  . Penicillins Itching, Nausea And Vomiting and Rash    Has patient had a PCN reaction causing immediate rash, facial/tongue/throat swelling, SOB or lightheadedness with hypotension: Yes Has patient had a PCN reaction causing severe rash involving mucus membranes or skin necrosis: No Has patient had a PCN reaction that required hospitalization No Has patient had a PCN reaction occurring within the last 10 years: Yes If all of the above answers are "NO", then may proceed  with Cephalosporin use.  . Topamax [Topiramate] Nausea Only    Family History  Problem Relation Age of Onset  . Heart disease Mother   . COPD Mother   . Diabetes Mother   . Breast cancer Mother   . Heart disease Father   . Hyperlipidemia Father   . COPD Sister   . Heart disease Sister   . Diabetes Sister   . Heart disease Maternal Grandmother   . Cancer Maternal Grandmother        stomach  . Cancer Maternal Grandfather        lung   . Bipolar disorder Brother     Prior to Admission medications   Medication Sig Start Date End Date Taking? Authorizing Provider  albuterol (PROVENTIL) (2.5 MG/3ML) 0.083% nebulizer solution Take 3 mLs (2.5 mg total) by nebulization every 6 (six) hours as needed for wheezing or shortness of breath. 02/17/20   Chesley Mires, MD  allopurinol (ZYLOPRIM) 100 MG tablet TAKE 1 TABLET BY MOUTH ONCE DAILY. 12/08/17   Lucila Maine C, DO  budesonide (PULMICORT) 0.5 MG/2ML nebulizer solution Take 2 mLs (0.5 mg total) by nebulization 2 (two) times daily. 02/17/20   Chesley Mires, MD  cetirizine (ZYRTEC) 10 MG tablet Take 1 tablet (10 mg total) by mouth at bedtime. 01/30/17   Rogue Bussing, MD  cholecalciferol (VITAMIN D) 400 units TABS tablet Take 1 tablet (400 Units total) by mouth 2 (two) times daily. 07/17/17   Rogue Bussing, MD  clobetasol cream (TEMOVATE) 0.05 %  01/28/19   [provider]  colchicine 0.6 MG tablet Take 1 tablet (0.6 mg total) by mouth as needed. 01/25/19   Kathie Dike, MD  escitalopram (LEXAPRO) 10 MG tablet Take 1 tablet (10 mg total) by mouth at bedtime. 10/24/17   Norman Clay, MD  FEROSUL 325 (65 Fe) MG tablet Take 1 tablet by mouth daily. 01/08/19   [provider]  fluticasone (FLONASE) 50 MCG/ACT nasal spray Place 1 spray into both nostrils 2 (two) times daily.    [provider]  furosemide (LASIX) 40 MG tablet TAKE (2) TABLETS BY MOUTH THREE TIMES DAILY. 03/09/20   Hilty, Nadean Corwin, MD  Insulin Glargine (BASAGLAR KWIKPEN) 100 UNIT/ML SOPN Inject 0.6 mLs (60 Units total) into the skin at bedtime. Patient taking differently: Inject 40 Units into the skin 2 (two) times daily.  11/12/17   Cassandria Anger, MD  insulin lispro (HUMALOG KWIKPEN) 100 UNIT/ML KiwkPen Inject 0.1-0.16 mLs (10-16 Units total) into the skin 3 (three) times daily before meals. Patient taking differently: Inject 16-18 Units into the skin 3 (three) times daily before meals.  11/12/17   Cassandria Anger, MD  Insulin Pen Needle 32G X 4 MM MISC Use to inject insulin 5 times daily 03/27/18   Philemon Kingdom, MD  ipratropium (ATROVENT) 0.02 % nebulizer solution Take 2.5 mLs (0.5 mg total) by nebulization every 6 (six) hours as needed for wheezing or shortness of breath. 02/17/20   Chesley Mires, MD  losartan (COZAAR) 25 MG tablet TAKE (1/2) TABLET BY MOUTH ONCE DAILY. 09/23/19   Hilty, Nadean Corwin, MD  metolazone (ZAROXOLYN) 5 MG tablet Take 1 tablet (5 mg total) by mouth daily. 12/27/19 03/26/20  Pixie Casino, MD  metoprolol succinate (TOPROL-XL) 25 MG 24 hr tablet TAKE 1 TABLET BY MOUTH TWICE DAILY 09/23/19   Hilty, Nadean Corwin, MD    mirtazapine (REMERON) 15 MG tablet Take 1 tablet (15 mg total) by mouth at bedtime.  10/24/17   Norman Clay, MD  Misc Natural Products (TART CHERRY ADVANCED PO) Take by mouth daily.    [provider]  modafinil (PROVIGIL) 200 MG tablet Take 1 tablet (200 mg total) by mouth daily. 02/17/20   Chesley Mires, MD  mometasone (NASONEX) 50 MCG/ACT nasal spray Place 1 spray into the nose daily. 01/10/17   Chesley Mires, MD  montelukast (SINGULAIR) 10 MG tablet Take 1 tablet (10 mg total) by mouth at bedtime. 02/17/20   Chesley Mires, MD  mupirocin cream (BACTROBAN) 2 % Apply 1 application topically 2 (two) times daily. 11/27/17   Rogue Bussing, MD  nitroGLYCERIN (NITROSTAT) 0.4 MG SL tablet Place 0.4 mg under the tongue every 5 (five) minutes as needed for chest pain.     [provider]  nystatin (NYSTATIN) powder Apply topically 2 (two) times daily. Apply under breasts 10/09/16   Rogue Bussing, MD  ondansetron (ZOFRAN) 4 MG tablet Take 4 mg by mouth once.    [provider]  oxyCODONE-acetaminophen (PERCOCET/ROXICET) 5-325 MG tablet Take 1 tablet by mouth 2 (two) times daily as needed for severe pain. 11/27/17   Rogue Bussing, MD  pantoprazole (PROTONIX) 20 MG tablet TAKE (1) TABLET BY MOUTH TWICE A DAY BEFORE MEALS. (BREAKFAST AND SUPPER) 12/02/17   Riccio, Gardiner Rhyme, DO  potassium chloride SA (KLOR-CON) 20 MEQ tablet Take 20 mEq by mouth 2 (two) times daily.  05/07/19   [provider]  potassium chloride SA (KLOR-CON) 20 MEQ tablet TAKE 1 TABLET BY MOUTH TWICE DAILY 02/11/20   Hilty, Nadean Corwin, MD  primidone (MYSOLINE) 50 MG tablet TAKE ONE TABLET BY MOUTH AT BEDTIME. 11/10/17   Rogue Bussing, MD  Probiotic Product (PROBIOTIC PO) Take 1 tablet by mouth at bedtime.    [provider]  triamcinolone lotion (KENALOG) 0.1 % Apply 1 application topically 3 (three) times daily. For up to 14 days and as needed 01/26/16   Martinique, Betty G,  MD  TRUE [facility] BLOOD GLUCOSE TEST test strip USE TO TEST BLOOD SUGAR AS DIRECTED. 09/24/17   Cassandria Anger, MD  Vitamin A 2400 MCG (8000 UT) CAPS Take 8,000 Units by mouth every morning.     [provider]  warfarin (COUMADIN) 4 MG tablet Take 2 tablets tonight, 1 1/2 tablets tomorrow night then increase dose to 1 tablet daily 03/02/20   Hilty, Nadean Corwin, MD  potassium chloride 20 MEQ TBCR Take 20 mEq by mouth 2 (two) times daily. 05/02/19 07/02/19  Barton Dubois, MD    Physical Exam: Vitals:   03/20/20 1422 03/20/20 1427 03/20/20 1600 03/20/20 1715  BP: (!) 132/110  (!) 126/58 108/73  Pulse: (!) 55   73  Resp: 20   (!) 26  Temp: 98.4 F (36.9 C)     TempSrc: Oral     SpO2: 95%  99% 100%  Weight:  77.1 kg    Height:  4\' 11"  (1.499 m)      Constitutional: NAD, calm, comfortable Vitals:   03/20/20 1422 03/20/20 1427 03/20/20 1600 03/20/20 1715  BP: (!) 132/110  (!) 126/58 108/73  Pulse: (!) 55   73  Resp: 20   (!) 26  Temp: 98.4 F (36.9 C)     TempSrc: Oral     SpO2: 95%  99% 100%  Weight:  77.1 kg    Height:  4\' 11"  (1.499 m)     Eyes: PERRL, lids and conjunctivae normal ENMT: Mucous membranes are  very dry  neck: normal, supple, no masses, no thyromegaly Respiratory: clear to auscultation bilaterally, no wheezing, no crackles. Normal respiratory effort. No accessory muscle use.  Cardiovascular: irregular rate and rhythm, no murmurs / rubs / gallops. No extremity edema. 2+ pedal pulses.   Abdomen: no tenderness, no masses palpated. No hepatosplenomegaly. Bowel sounds positive.  Musculoskeletal: no clubbing / cyanosis. No joint deformity upper and lower extremities. Good ROM, no contractures. Normal muscle tone.  Skin: no rashes, lesions, ulcers. No induration Neurologic: No apparent cranial abnormality, moving extremities spontaneously. Psychiatric: Normal judgment and insight. Alert and oriented x 3. Normal mood.   Labs on Admission: I have personally  reviewed following labs and imaging studies  CBC: Recent Labs  Lab 03/20/20 1604  WBC 11.5*  NEUTROABS 9.7*  HGB 12.5  HCT 36.2  MCV 83.6  PLT 700   Basic Metabolic Panel: Recent Labs  Lab 03/20/20 1604  NA 119*  K 3.0*  CL 64*  CO2 36*  GLUCOSE 761*  BUN 136*  CREATININE 2.85*  CALCIUM 8.5*    Coagulation Profile: Recent Labs  Lab 03/20/20 1604  INR 2.1*    Radiological Exams on Admission: CT Head Wo Contrast  Result Date: 03/20/2020 CLINICAL DATA:  Dizzy spells since COVID vaccine EXAM: CT HEAD WITHOUT CONTRAST TECHNIQUE: Contiguous axial images were obtained from the base of the skull through the vertex without intravenous contrast. COMPARISON:  CT brain 12/11/2015 FINDINGS: Brain: No acute territorial infarction, hemorrhage or intracranial mass. The ventricles are nonenlarged. Vascular: No hyperdense vessel or unexpected calcification. Skull: Normal. Negative for fracture or focal lesion. Sinuses/Orbits: No acute finding.  Frothy debris in the nasopharynx. Other: None IMPRESSION: Negative non contrasted CT appearance of the brain. Electronically Signed   By: Donavan Foil M.D.   On: 03/20/2020 16:37   DG Hip Unilat W or Wo Pelvis 2-3 Views Left  Result Date: 03/20/2020 CLINICAL DATA:  Fall EXAM: DG HIP (WITH OR WITHOUT PELVIS) 2-3V LEFT COMPARISON:  None. FINDINGS: There is no acute fracture or dislocation. Mild changes of osteoarthritis. Bladder stimulator is present. IMPRESSION: No acute fracture. Electronically Signed   By: Macy Mis M.D.   On: 03/20/2020 16:28    EKG: None  Assessment/Plan Principal Problem:   Hyperglycemia Active Problems:   Unspecified atrial fibrillation (HCC)   Chronic hypoxic, hypercapnic respiratory failure   OSA (obstructive sleep apnea)   COPD with asthma (HCC)   Chronic systolic CHF (congestive heart failure) (HCC)   Type 2 diabetes mellitus with stage 3 chronic kidney disease, with long-term current use of insulin  (HCC)   Gout   Chronic kidney disease   Hyperglycemia in diabetic-blood glucose low 761, serum bicarb of 36, but patient is a chronic CO2 retainer with baseline in the 40s, anion gap elevated at 19, acute kidney injury.  Doubt DKA, with beta hydroxy uric acid unremarkable at 0.4, venous blood gas 7.3.  She reports compliance with her insulins which include - Lantus 50 units twice daily, and sliding scale insulin. -Continue insulin drip -Obtain hemoglobin A1c -BMP every 4 hourly x 4 -Allow low caloric fluids -2.5 L bolus given, continue LR at 125 cc/h  Acute kidney injury on CKD3-creatinine 2.85, baseline 1-1.1.  Likely prerenal from dehydration and concomitant diuretic-metolazone and  ARB use -IV fluids  Electrolyte abnormalities-hypokalemia of 3, pseudohyponatremia sodium 119 corrected 130, due to   hyperglycemia. -Replete and hydrate.  Dizziness, falls-likely due to dehydration from hyperglycemia with concomitant diuretic use.  Head CT and  pelvic x-ray without acute abnormality.  Echo 04/2019 EF 50 to 55% with grade 2 diastolic dysfunction, no significant valvular abnormality noted. -Ambulate prior to discharge, reassess dizziness after hydration  History of systolic and diastolic CHF-stable and compensated, currently dehydrated requiring IV fluids.  Echo per above.  Weights improved over the past 6 months compared to baseline. -Hold home Lasix 80 mg 3 times daily, and metolazone  COPD with chronic respiratory failure, OSA- new cough over the past 3 days, no difficulty breathing, no wheezing, but currently on 4 L O2 due to sats of 87 -88% on 3 L.  Covid test negative.  Received 1 dose of Pfizer vaccine. Port chest xray unremarkable.  -As needed DuoNebs -Resume budesonide nebulizer, -Resume home BiPAP -Mucolytics scheduled  Atrial fibrillation-rate controlled and on chronic anticoagulation with warfarin.  INR therapeutic at 2.1. -Continue warfarin pharmacy to dose -Awaiting med  reconciliation, patient is unable to tell me exactly what she takes and how frequently, she is on multiple medications.  Resume metoprolol  Hypertension-stable. -Med reconciliation pending -Hold Lasix and metolazone, losartan with acute kidney injury dehydration  Gout, chronic pain -Resume Percocet.,   -Pending med reconciliation resume allopurinol.  Polypharmacy -Awaiting med reconciliation, patient is on multiple medication, she is able to confirm insulin doses, Percocets and that she is on warfarin, she is unable to confirm names or frequency of other medication she takes.    DVT prophylaxis: Warfarin Code Status: Full code Family Communication: Spouse at bedside, plan of care explained and questions answered Disposition Plan: ~ 2 days Consults called: None Admission status: inPatient, telemetry I certify that at the point of admission it is my clinical judgment that the patient will require inpatient hospital care spanning beyond 2 midnights from the point of admission due to high intensity of service, high risk for further deterioration and high frequency of surveillance required. The following factors support the patient status of inpatient: Patient requiring stepdown level of care, frequent monitoring of blood glucose and electrolytes and creatinine while on IV fluids and insulin drip.   Bethena Roys MD [facility]  03/20/2020, 7:53 PM

## 2020-03-20 NOTE — Telephone Encounter (Signed)
Called pt but unable to reach. Left message for her to return call. 

## 2020-03-20 NOTE — Progress Notes (Signed)
Hooked up patient's home Bipap for her.  Placed sterile water for humidification, and hooked up O2 at 3L to run through machine.  Patient self manages machine and can place herself on/off machine.  [name] sat is 100%, HR 70, R 10, BP of 112/61.  BS are clear and diminished in the bases.  Will continue to monitor.

## 2020-03-20 NOTE — ED Notes (Signed)
CRITICAL VALUE ALERT  Critical Value:  Venous PCO2 75.6  & PO2 <31.0  Date & Time Notied:  03/20/2020 1931  Provider Notified: Dr. Denton Brick  Orders Received/Actions taken: see chart

## 2020-03-20 NOTE — ED Notes (Signed)
Pt yelling when brought back to room. Pt reports "I can't lay down, I feel like im losing my air." moderate anxiety noted. Pt repositioned and reports wears 3 liters at home. pt placed on 3 liters and noted 02 saturation of 87-88%. o2 increased to 4 liters. Pt 02 saturation noted to be 92%. nad noted.

## 2020-03-20 NOTE — ED Notes (Signed)
Unable to obtain second IV. Other ED RN at bedside attempting with ultrasound.

## 2020-03-20 NOTE — ED Notes (Signed)
Attempting to obtain second IV. Unable to obtain x2. AC at bedside attempting to obtain secondary access.

## 2020-03-20 NOTE — ED Notes (Signed)
Date and time results received: 03/20/20 1659   Test: Labs Critical Value: NA 119, CL 64, Glucose 761  Name of Provider Notified: Rhodia Albright  Orders Received? Or Actions Taken?: No new orders given.

## 2020-03-20 NOTE — ED Triage Notes (Signed)
Pt reports she has had dizzy spells since receiving the COVID vaccine, reports dizziness x 2 nights ago where she fell and hit her back on her dresser-c/o lower back pain and left hip pain, denies LOC or hitting head

## 2020-03-20 NOTE — ED Notes (Addendum)
CRITICAL VALUE ALERT  Critical Value:  Glucose 739  Date & Time Notied:  03/20/2020 2033  Provider Notified: Dr. Denton Brick  Orders Received/Actions taken: see chart

## 2020-03-20 NOTE — ED Provider Notes (Signed)
[facility] EMERGENCY DEPARTMENT Provider Note   CSN: [id number] Arrival date & time: 03/20/20  1406     History Chief Complaint  Patient presents with   Denise Freeman is a 69 y.o. female with a history of atrial fibrillation on chronic Coumadin, chronic bronchitis with a history of CHF, COPD on chronic home oxygen at 3 L, type 2 diabetes, and who is clinically blind presenting for evaluation of episodes of lightheaded and hand tremors which she has noticed since taking her first Covid vaccine last month.  She is here specifically secondary to a fall that occurred 2 days ago when she had one of her lightheaded spells.  She describes standing in her room when she became lightheaded and fell backwards hitting her left hip and buttock area against her dresser.  She denies hitting her head, denies headache neck or back or extremity injuries with this fall.  She denies LOC.      HPI     Past Medical History:  Diagnosis Date   Allergy    Anemia    Anxiety    Asthma    Atrial fibrillation ([place])    Bipolar 1 disorder ([place])    Blind    "partially in both eyes" (03/14/2016)   Cholelithiasis    a. 09/2016 s/p Lap Chole.   Chronic bronchitis (HCC)    Chronic combined systolic and diastolic congestive heart failure ([place])    a. 09/2016 Echo: EF 30-35%.   Colon polyps    COPD (chronic obstructive pulmonary disease) (HCC)    Depression    Family history of adverse reaction to anesthesia    Uncle was positive for malignant hyperthermia; patient had testing done and was negative.   Fibromyalgia    GERD (gastroesophageal reflux disease)    Gout    High cholesterol    History of blood transfusion 06/2015   "bleeding from my rectum"   History of hiatal hernia    HOH (hard of hearing)    Hx of colonic polyps 03/21/2016   3 small adenomas no recall - co-morbidities   Neuropathy    Disc Back    NICM (nonischemic cardiomyopathy) ([place])    a. Previously  worked up in [place], MD-->low EF with subsequent recovery.  Multiple caths (last ~ 2014 per pt report)--reportedly nl cors;  b. 09/2016 Echo: EF 30-35%, antsept/apical HK, mild MR, mildly dil LA, mod dil RA;  c. 09/2016 Lexi MV: EF 26%, glob HK, sept DK, med size, mod intensity fixed septal defect - BBB/PVC related artifact, no ischemia.   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   Vision loss, central 03/15/2020   Pseudophakia, both eyes 03/15/2020   Gastroesophageal reflux disease    Prolonged QT interval 04/27/2019   CHF exacerbation ([place]) 01/24/2019   Acute exacerbation of CHF (congestive heart failure) ([place]) 01/23/2019   Gout 11/16/2017   Chronic kidney disease 11/16/2017   Uncontrolled type 2 diabetes mellitus with hyperglycemia ([place]) 11/12/2017   Pain of left lower extremity  10/24/2017   Medically complex patient 10/03/2017   Chronic pain syndrome 10/03/2017   Unstable gait 10/03/2017   Diarrhea 07/29/2017   Type 2 diabetes mellitus with stage 3 chronic kidney disease, with long-term current use of insulin ([place]) 04/01/2017   Essential hypertension, benign 04/01/2017   Mood disorder in conditions classified elsewhere 03/31/2017   Chronic diastolic congestive heart failure ([place]) 27/11/2374   Chronic systolic CHF (congestive heart failure) ([place]) 11/05/2016   Pressure injury of skin 09/09/2016   NICM (nonischemic cardiomyopathy) ([place])    Obesity, Class III, BMI 40-49.9 (morbid obesity) ([place])    Acute systolic congestive heart failure ([place])    S/P  laparoscopic cholecystectomy    Heart failure ([place]) 09/06/2016   Dyslipidemia 08/07/2016   Vision loss 04/19/2016   Hx of colonic polyps 03/21/2016   Blood in stool    Benign neoplasm of cecum    Benign neoplasm of rectum    CKD (chronic kidney disease), stage III ([place]) 03/14/2016   Mixed hyperlipidemia 03/14/2016   Scalp lesion 03/14/2016   Peripheral polyneuropathy 01/26/2016   Vitamin D deficiency 09/26/2015   DM type 2 causing vascular disease ([place]) 08/28/2015   Chronic upper GI bleeding 06/24/2015   COPD with asthma ([place]) 02/22/2015   OSA (obstructive sleep apnea) 08/22/2014   Other emphysema ([place]) 05/29/2014   Chronic hypoxic, hypercapnic respiratory failure 04/21/2014   Microcytic anemia 04/21/2014   Fibromyalgia 04/14/2014   Uncontrolled secondary diabetes mellitus with stage 3 CKD (GFR 30-59) ([place]) 04/14/2014   Dyspnea on exertion 04/14/2014   Acute on chronic combined systolic and diastolic CHF (congestive heart failure) ([place]) 04/14/2014   Unspecified atrial fibrillation ([place]) 04/12/2014    Past Surgical History:  Procedure Laterality Date   APPENDECTOMY     "they busted"   bladder stimulator     pt states, "it cannot be turned off; it's in my right hip; dead battery so it's not working anymore". (03/14/2016)   BLADDER SUSPENSION     2003, 2006 and 2010   CATARACT EXTRACTION W/PHACO Right 11/29/2014   Procedure: CATARACT EXTRACTION PHACO AND INTRAOCULAR LENS PLACEMENT (IOC);  Surgeon: Rutherford Guys, MD;  Location: [facility];  Service: Ophthalmology;  Laterality: Right;  CDE:3.81   CATARACT EXTRACTION W/PHACO Left 12/13/2014   Procedure: CATARACT EXTRACTION PHACO AND INTRAOCULAR LENS PLACEMENT (IOC);  Surgeon: Rutherford Guys, MD;  Location: [facility];  Service: Ophthalmology;  Laterality: Left;  CDE:6.59   CERVICAL DISC SURGERY N/A 2009   4, 6, and 7 cervical disc replaced   CHOLECYSTECTOMY N/A 09/13/2016   Procedure: LAPAROSCOPIC CHOLECYSTECTOMY;   Surgeon: Rolm Bookbinder, MD;  Location: [place];  Service: General;  Laterality: N/A;   COLONOSCOPY WITH PROPOFOL N/A 03/15/2016   Procedure: COLONOSCOPY WITH PROPOFOL;  Surgeon: Gatha Mayer, MD;  Location: [place];  Service: Endoscopy;  Laterality: N/A;   ESOPHAGOGASTRODUODENOSCOPY (EGD) WITH PROPOFOL N/A 03/15/2016   Procedure: ESOPHAGOGASTRODUODENOSCOPY (EGD) WITH PROPOFOL;  Surgeon: Gatha Mayer, MD;  Location: [place];  Service: Endoscopy;  Laterality: N/A;   HEEL SPUR SURGERY Bilateral    HERNIA REPAIR     I & D EXTREMITY Right 06/13/2015   Procedure: MINOR IRRIGATION AND DEBRIDEMENT EXTREMITY REMOVAL OF NAIL;  Surgeon: Daryll Brod, MD;  Location: [place];  Service: Orthopedics;  Laterality: Right;   TUBAL LIGATION     UMBILICAL HERNIA REPAIR     w/mesh     OB History   No obstetric history on file.     Family  History  Problem Relation Age of Onset   Heart disease Mother    COPD Mother    Diabetes Mother    Breast cancer Mother    Heart disease Father    Hyperlipidemia Father    COPD Sister    Heart disease Sister    Diabetes Sister    Heart disease Maternal Grandmother    Cancer Maternal Grandmother        stomach   Cancer Maternal Grandfather        lung    Bipolar disorder Brother     Social History   Tobacco Use   Smoking status: Former Smoker    Packs/day: 2.00    Years: 14.00    Pack years: 28.00    Types: Cigarettes    Quit date: 04/12/1989    Years since quitting: 30.9   Smokeless tobacco: Never Used  Scientific laboratory technician Use: Never used  Substance Use Topics   Alcohol use: No    Alcohol/week: 0.0 standard drinks   Drug use: No    Home Medications Prior to Admission medications   Medication Sig Start Date End Date Taking? Authorizing Provider  albuterol (PROVENTIL) (2.5 MG/3ML) 0.083% nebulizer solution Take 3 mLs (2.5 mg total) by nebulization every 6 (six) hours as needed for wheezing  or shortness of breath. 02/17/20   Chesley Mires, MD  allopurinol (ZYLOPRIM) 100 MG tablet TAKE 1 TABLET BY MOUTH ONCE DAILY. 12/08/17   Lucila Maine C, DO  budesonide (PULMICORT) 0.5 MG/2ML nebulizer solution Take 2 mLs (0.5 mg total) by nebulization 2 (two) times daily. 02/17/20   Chesley Mires, MD  cetirizine (ZYRTEC) 10 MG tablet Take 1 tablet (10 mg total) by mouth at bedtime. 01/30/17   Rogue Bussing, MD  cholecalciferol (VITAMIN D) 400 units TABS tablet Take 1 tablet (400 Units total) by mouth 2 (two) times daily. 07/17/17   Rogue Bussing, MD  clobetasol cream (TEMOVATE) 0.05 %  01/28/19   [provider]  colchicine 0.6 MG tablet Take 1 tablet (0.6 mg total) by mouth as needed. 01/25/19   Kathie Dike, MD  escitalopram (LEXAPRO) 10 MG tablet Take 1 tablet (10 mg total) by mouth at bedtime. 10/24/17   Norman Clay, MD  FEROSUL 325 (65 Fe) MG tablet Take 1 tablet by mouth daily. 01/08/19   [provider]  fluticasone (FLONASE) 50 MCG/ACT nasal spray Place 1 spray into both nostrils 2 (two) times daily.    [provider]  furosemide (LASIX) 40 MG tablet TAKE (2) TABLETS BY MOUTH THREE TIMES DAILY. 03/09/20   Hilty, Nadean Corwin, MD  Insulin Glargine (BASAGLAR KWIKPEN) 100 UNIT/ML SOPN Inject 0.6 mLs (60 Units total) into the skin at bedtime. Patient taking differently: Inject 40 Units into the skin 2 (two) times daily.  11/12/17   Cassandria Anger, MD  insulin lispro (HUMALOG KWIKPEN) 100 UNIT/ML KiwkPen Inject 0.1-0.16 mLs (10-16 Units total) into the skin 3 (three) times daily before meals. Patient taking differently: Inject 16-18 Units into the skin 3 (three) times daily before meals.  11/12/17   Cassandria Anger, MD  Insulin Pen Needle 32G X 4 MM MISC Use to inject insulin 5 times daily 03/27/18   Philemon Kingdom, MD  ipratropium (ATROVENT) 0.02 % nebulizer solution Take 2.5 mLs (0.5 mg total) by nebulization every 6 (six) hours as needed for  wheezing or shortness of breath. 02/17/20   Chesley Mires, MD  losartan (COZAAR) 25 MG tablet TAKE (1/2)  TABLET BY MOUTH ONCE DAILY. 09/23/19   Hilty, Nadean Corwin, MD  metolazone (ZAROXOLYN) 5 MG tablet Take 1 tablet (5 mg total) by mouth daily. 12/27/19 03/26/20  Pixie Casino, MD  metoprolol succinate (TOPROL-XL) 25 MG 24 hr tablet TAKE 1 TABLET BY MOUTH TWICE DAILY 09/23/19   Hilty, Nadean Corwin, MD  mirtazapine (REMERON) 15 MG tablet Take 1 tablet (15 mg total) by mouth at bedtime. 10/24/17   Norman Clay, MD  Misc Natural Products (TART CHERRY ADVANCED PO) Take by mouth daily.    [provider]  modafinil (PROVIGIL) 200 MG tablet Take 1 tablet (200 mg total) by mouth daily. 02/17/20   Chesley Mires, MD  mometasone (NASONEX) 50 MCG/ACT nasal spray Place 1 spray into the nose daily. 01/10/17   Chesley Mires, MD  montelukast (SINGULAIR) 10 MG tablet Take 1 tablet (10 mg total) by mouth at bedtime. 02/17/20   Chesley Mires, MD  mupirocin cream (BACTROBAN) 2 % Apply 1 application topically 2 (two) times daily. 11/27/17   Rogue Bussing, MD  nitroGLYCERIN (NITROSTAT) 0.4 MG SL tablet Place 0.4 mg under the tongue every 5 (five) minutes as needed for chest pain.     [provider]  nystatin (NYSTATIN) powder Apply topically 2 (two) times daily. Apply under breasts 10/09/16   Rogue Bussing, MD  ondansetron (ZOFRAN) 4 MG tablet Take 4 mg by mouth once.    [provider]  oxyCODONE-acetaminophen (PERCOCET/ROXICET) 5-325 MG tablet Take 1 tablet by mouth 2 (two) times daily as needed for severe pain. 11/27/17   Rogue Bussing, MD  pantoprazole (PROTONIX) 20 MG tablet TAKE (1) TABLET BY MOUTH TWICE A DAY BEFORE MEALS. (BREAKFAST AND SUPPER) 12/02/17   Riccio, Gardiner Rhyme, DO  potassium chloride SA (KLOR-CON) 20 MEQ tablet Take 20 mEq by mouth 2 (two) times daily.  05/07/19   [provider]  potassium chloride SA (KLOR-CON) 20 MEQ tablet TAKE 1 TABLET BY MOUTH  TWICE DAILY 02/11/20   Hilty, Nadean Corwin, MD  primidone (MYSOLINE) 50 MG tablet TAKE ONE TABLET BY MOUTH AT BEDTIME. 11/10/17   Rogue Bussing, MD  Probiotic Product (PROBIOTIC PO) Take 1 tablet by mouth at bedtime.    [provider]  triamcinolone lotion (KENALOG) 0.1 % Apply 1 application topically 3 (three) times daily. For up to 14 days and as needed 01/26/16   [place], Betty G, MD  TRUE [facility] BLOOD GLUCOSE TEST test strip USE TO TEST BLOOD SUGAR AS DIRECTED. 09/24/17   Cassandria Anger, MD  Vitamin A 2400 MCG (8000 UT) CAPS Take 8,000 Units by mouth every morning.     [provider]  warfarin (COUMADIN) 4 MG tablet Take 2 tablets tonight, 1 1/2 tablets tomorrow night then increase dose to 1 tablet daily 03/02/20   Hilty, Nadean Corwin, MD  potassium chloride 20 MEQ TBCR Take 20 mEq by mouth 2 (two) times daily. 05/02/19 07/02/19  Barton Dubois, MD    Allergies    Oseltamivir, Xarelto [rivaroxaban], Ancef [cefazolin], Levaquin [levofloxacin in d5w], Lyrica [pregabalin], Tamiflu [oseltamivir phosphate], Zoloft [sertraline hcl], Torsemide, Augmentin [amoxicillin-pot clavulanate], Ciprofloxacin, Haldol [haloperidol], Nsaids, Penicillins, and Topamax [topiramate]  Review of Systems   Review of Systems  Constitutional: Negative for chills and fever.  HENT: Negative for congestion and sore throat.   Eyes: Negative.   Respiratory: Negative for chest tightness and shortness of breath.   Cardiovascular: Negative for chest pain.  Gastrointestinal: Negative for abdominal pain, nausea and vomiting.  Genitourinary: Negative.   Musculoskeletal: Positive for arthralgias. Negative for joint swelling and neck pain.  Skin: Negative.  Negative for rash and wound.  Neurological: Positive for weakness and light-headedness. Negative for dizziness, numbness and headaches.  Psychiatric/Behavioral: Negative.     Physical Exam Updated Vital Signs BP (!) 119/58    Pulse 69    Temp  98.4 F (36.9 C) (Oral)    Resp 16    Ht 4\' 11"  (1.499 m)    Wt 77.1 kg    SpO2 100%    BMI 34.34 kg/m   Physical Exam Vitals and nursing note reviewed.  Constitutional:      Appearance: She is well-developed.  HENT:     Head: Normocephalic and atraumatic.     Mouth/Throat:     Mouth: Mucous membranes are dry.  Eyes:     Extraocular Movements: Extraocular movements intact.     Pupils: Pupils are equal, round, and reactive to light.  Cardiovascular:     Rate and Rhythm: Normal rate and regular rhythm.     Heart sounds: Normal heart sounds.  Pulmonary:     Effort: Pulmonary effort is normal.     Breath sounds: Normal breath sounds. No wheezing or rhonchi.     Comments: Frequent dry sounding cough.  On her maintenance home o2 3L.  Chest:     Chest wall: No tenderness.  Abdominal:     General: Bowel sounds are normal.     Palpations: Abdomen is soft.     Tenderness: There is no abdominal tenderness.  Musculoskeletal:        General: Tenderness present. Normal range of motion.     Cervical back: Normal range of motion.       Legs:     Comments: TTP left lateral hip and buttock. No bruising or palpable hematoma.  Skin:    General: Skin is warm and dry.  Neurological:     Mental Status: She is alert.     ED Results / Procedures / Treatments   Labs (all labs ordered are listed, but only abnormal results are displayed) Labs Reviewed  CBC - Abnormal; Notable for the following components:      Result Value   WBC 11.5 (*)    All other components within normal limits  BASIC METABOLIC PANEL - Abnormal; Notable for the following components:   Sodium 119 (*)    Potassium 3.0 (*)    Chloride 64 (*)    CO2 36 (*)    Glucose, Bld 761 (*)    BUN 136 (*)    Creatinine, Ser 2.85 (*)    Calcium 8.5 (*)    GFR, Estimated 16 (*)    Anion gap 19 (*)    All other components within normal limits  PROTIME-INR - Abnormal; Notable for the following components:   Prothrombin Time 22.9  (*)    INR 2.1 (*)    All other components within normal limits  DIFFERENTIAL - Abnormal; Notable for the following components:   Neutro Abs 9.7 (*)    All other components within normal limits  RESP PANEL BY RT PCR (RSV, FLU A&B, COVID)  URINALYSIS, ROUTINE W REFLEX MICROSCOPIC  MAGNESIUM  BLOOD GAS, VENOUS  BETA-HYDROXYBUTYRIC ACID  HEMOGLOBIN A1C    EKG None  Radiology CT Head Wo Contrast  Result Date: 03/20/2020 CLINICAL DATA:  Dizzy spells since COVID vaccine EXAM: CT HEAD WITHOUT CONTRAST TECHNIQUE: Contiguous axial images were obtained from the base of the skull  through the vertex without intravenous contrast. COMPARISON:  CT brain 12/11/2015 FINDINGS: Brain: No acute territorial infarction, hemorrhage or intracranial mass. The ventricles are nonenlarged. Vascular: No hyperdense vessel or unexpected calcification. Skull: Normal. Negative for fracture or focal lesion. Sinuses/Orbits: No acute finding.  Frothy debris in the nasopharynx. Other: None IMPRESSION: Negative non contrasted CT appearance of the brain. Electronically Signed   By: Donavan Foil M.D.   On: 03/20/2020 16:37   DG Hip Unilat W or Wo Pelvis 2-3 Views Left  Result Date: 03/20/2020 CLINICAL DATA:  Fall EXAM: DG HIP (WITH OR WITHOUT PELVIS) 2-3V LEFT COMPARISON:  None. FINDINGS: There is no acute fracture or dislocation. Mild changes of osteoarthritis. Bladder stimulator is present. IMPRESSION: No acute fracture. Electronically Signed   By: Macy Mis M.D.   On: 03/20/2020 16:28    Procedures Procedures (including critical care time)  Medications Ordered in ED Medications  insulin regular, human (MYXREDLIN) 100 units/ 100 mL infusion (has no administration in time range)  lactated ringers infusion (has no administration in time range)  dextrose 5 % in lactated ringers infusion (has no administration in time range)  dextrose 50 % solution 0-50 mL (has no administration in time range)  lactated ringers  bolus 1,542 mL (has no administration in time range)  potassium chloride 10 mEq in 100 mL IVPB (10 mEq Intravenous New Bag/Given 03/20/20 1756)  oxyCODONE-acetaminophen (PERCOCET/ROXICET) 5-325 MG per tablet 1 tablet (1 tablet Oral Given 03/20/20 1609)  lactated ringers bolus 1,000 mL (1,000 mLs Intravenous New Bag/Given 03/20/20 1732)    ED Course  I have reviewed the triage vital signs and the nursing notes.  Pertinent labs & imaging results that were available during my care of the patient were reviewed by me and considered in my medical decision making (see chart for details).    MDM Rules/Calculators/A&P                          Pt with acute hyperglycemia along with acute renal failure/dehydration. Corrected sodium 130,  Significant hypokalemia and hypochloridemia.  KCL runs x 4 ordered,  Patient started on insulin and Lactated ringers for electrolyte correction.    Imaging reviewed and negative for acute injury.    Discussed with Dr. Denton Brick who accepts pt for admission.    Final Clinical Impression(s) / ED Diagnoses Final diagnoses:  Type 2 diabetes mellitus with hyperglycemia, unspecified whether long term insulin use (HCC)  Hypokalemia  Acute renal failure, unspecified acute renal failure type ([place])  Hyperosmolar hyperglycemic state (HHS) [facility])    Rx / DC Orders ED Discharge Orders    None       Landis Martins 03/20/20 Donney Dice, MD 03/21/20 [phone]

## 2020-03-20 NOTE — Progress Notes (Signed)
ANTICOAGULATION CONSULT NOTE - Initial Consult  Pharmacy Consult for coumadin Indication: atrial fibrillation  Allergies  Allergen Reactions  . Oseltamivir Hives    Other reaction(s): Other (see comments) "water blisters"  . Xarelto [Rivaroxaban] Other (See Comments)    Internal bleeding  . Ancef [Cefazolin] Nausea And Vomiting  . Levaquin [Levofloxacin In D5w] Other (See Comments)    "afib"  . Lyrica [Pregabalin] Hives  . Tamiflu [Oseltamivir Phosphate] Other (See Comments)    "water blisters"  . Zoloft [Sertraline Hcl] Other (See Comments)    Jaw problems, jittery  . Torsemide Other (See Comments)    Not effective  . Augmentin [Amoxicillin-Pot Clavulanate] Itching  . Ciprofloxacin Itching and Nausea And Vomiting  . Haldol [Haloperidol] Other (See Comments)    Restless leg  . Nsaids Diarrhea  . Penicillins Itching, Nausea And Vomiting and Rash    Has patient had a PCN reaction causing immediate rash, facial/tongue/throat swelling, SOB or lightheadedness with hypotension: Yes Has patient had a PCN reaction causing severe rash involving mucus membranes or skin necrosis: No Has patient had a PCN reaction that required hospitalization No Has patient had a PCN reaction occurring within the last 10 years: Yes If all of the above answers are "NO", then may proceed with Cephalosporin use.  . Topamax [Topiramate] Nausea Only    Patient Measurements: Height: 4\' 11"  (149.9 cm) Weight: 77.1 kg (170 lb) IBW/kg (Calculated) : 43.2  Vital Signs: Temp: 97.9 F (36.6 C) (10/18 2200) Temp Source: Oral (10/18 2200) BP: 124/86 (10/18 2100) Pulse Rate: 52 (10/18 2200)  Labs: Recent Labs    03/20/20 1604 03/20/20 1842  HGB 12.5  --   HCT 36.2  --   PLT 230  --   LABPROT 22.9*  --   INR 2.1*  --   CREATININE 2.85* 2.73*    Estimated Creatinine Clearance: 17.4 mL/min (A) (by C-G formula based on SCr of 2.73 mg/dL (H)).   Medical History: Past Medical History:  Diagnosis  Date  . Allergy   . Anemia   . Anxiety   . Asthma   . Atrial fibrillation ([place])   . Bipolar 1 disorder ([place])   . 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 Cholelithiasis    a. 09/2016 s/p Lap Chole.  . Chronic bronchitis ([place])   . Chronic combined systolic and diastolic congestive heart failure ([place])    a. 09/2016 Echo: EF 30-35%.  . Colon polyps   . COPD (chronic obstructive pulmonary disease) ([place])   . Depression   . Family history of adverse reaction to anesthesia    Uncle was positive for malignant hyperthermia; patient had testing done and was negative.  . Fibromyalgia   . GERD (gastroesophageal reflux disease)   . Gout   . High cholesterol   . History of blood transfusion 06/2015   "bleeding from my rectum"  . History of hiatal hernia   . HOH (hard of hearing)   . Hx of colonic polyps 03/21/2016   3 small adenomas no recall - co-morbidities  . Neuropathy    Disc Back   . NICM (nonischemic cardiomyopathy) ([place])    a. Previously worked up in [place], MD-->low EF with subsequent recovery.  Multiple caths (last ~ 2014 per pt report)--reportedly nl cors;  b. 09/2016 Echo: EF 30-35%, antsept/apical HK, mild MR, mildly dil LA, mod dil RA;  c. 09/2016 Lexi MV: EF 26%, glob HK, sept DK, med size, mod intensity fixed septal defect -  BBB/PVC related artifact, no ischemia.  . On home oxygen therapy    "3L; 24/7" (03/14/2016)  . OSA treated with BiPAP    uses biPAP, 10 (03/14/2016)  . Osteoarthritis   . Oxygen deficiency   . Pneumonia   . Type II diabetes mellitus (HCC)     Medications:  Medications Prior to Admission  Medication Sig Dispense Refill Last Dose  . albuterol (PROVENTIL) (2.5 MG/3ML) 0.083% nebulizer solution Take 3 mLs (2.5 mg total) by nebulization every 6 (six) hours as needed for wheezing or shortness of breath. 75 mL 5   . allopurinol (ZYLOPRIM) 100 MG tablet TAKE 1 TABLET BY MOUTH ONCE DAILY. 30 tablet 0   . budesonide (PULMICORT) 0.5 MG/2ML nebulizer  solution Take 2 mLs (0.5 mg total) by nebulization 2 (two) times daily. 120 mL 11   . cetirizine (ZYRTEC) 10 MG tablet Take 1 tablet (10 mg total) by mouth at bedtime. 30 tablet 11   . cholecalciferol (VITAMIN D) 400 units TABS tablet Take 1 tablet (400 Units total) by mouth 2 (two) times daily. 180 each 3   . clobetasol cream (TEMOVATE) 0.05 %      . colchicine 0.6 MG tablet Take 1 tablet (0.6 mg total) by mouth as needed.     Marland Kitchen escitalopram (LEXAPRO) 10 MG tablet Take 1 tablet (10 mg total) by mouth at bedtime. 90 tablet 0   . FEROSUL 325 (65 Fe) MG tablet Take 1 tablet by mouth daily.     . fluticasone (FLONASE) 50 MCG/ACT nasal spray Place 1 spray into both nostrils 2 (two) times daily.     . furosemide (LASIX) 40 MG tablet TAKE (2) TABLETS BY MOUTH THREE TIMES DAILY. 180 tablet 3   . Insulin Glargine (BASAGLAR KWIKPEN) 100 UNIT/ML SOPN Inject 0.6 mLs (60 Units total) into the skin at bedtime. (Patient taking differently: Inject 40 Units into the skin 2 (two) times daily. ) 15 mL 2   . insulin lispro (HUMALOG KWIKPEN) 100 UNIT/ML KiwkPen Inject 0.1-0.16 mLs (10-16 Units total) into the skin 3 (three) times daily before meals. (Patient taking differently: Inject 16-18 Units into the skin 3 (three) times daily before meals. ) 15 mL 2   . Insulin Pen Needle 32G X 4 MM MISC Use to inject insulin 5 times daily 500 each 5   . ipratropium (ATROVENT) 0.02 % nebulizer solution Take 2.5 mLs (0.5 mg total) by nebulization every 6 (six) hours as needed for wheezing or shortness of breath. 62.5 mL 5   . losartan (COZAAR) 25 MG tablet TAKE (1/2) TABLET BY MOUTH ONCE DAILY. 15 tablet 11   . metolazone (ZAROXOLYN) 5 MG tablet Take 1 tablet (5 mg total) by mouth daily. 30 tablet 6   . metoprolol succinate (TOPROL-XL) 25 MG 24 hr tablet TAKE 1 TABLET BY MOUTH TWICE DAILY 60 tablet 11   . mirtazapine (REMERON) 15 MG tablet Take 1 tablet (15 mg total) by mouth at bedtime. 90 tablet 0   . Misc Natural Products (TART  CHERRY ADVANCED PO) Take by mouth daily.     . modafinil (PROVIGIL) 200 MG tablet Take 1 tablet (200 mg total) by mouth daily. 30 tablet 5   . mometasone (NASONEX) 50 MCG/ACT nasal spray Place 1 spray into the nose daily. 17 g 11   . montelukast (SINGULAIR) 10 MG tablet Take 1 tablet (10 mg total) by mouth at bedtime. 30 tablet 5   . mupirocin cream (BACTROBAN) 2 % Apply 1 application topically  2 (two) times daily. 15 g 0   . nitroGLYCERIN (NITROSTAT) 0.4 MG SL tablet Place 0.4 mg under the tongue every 5 (five) minutes as needed for chest pain.      Marland Kitchen nystatin (NYSTATIN) powder Apply topically 2 (two) times daily. Apply under breasts 15 g 3   . ondansetron (ZOFRAN) 4 MG tablet Take 4 mg by mouth once.     Marland Kitchen oxyCODONE-acetaminophen (PERCOCET/ROXICET) 5-325 MG tablet Take 1 tablet by mouth 2 (two) times daily as needed for severe pain. 60 tablet 0   . pantoprazole (PROTONIX) 20 MG tablet TAKE (1) TABLET BY MOUTH TWICE A DAY BEFORE MEALS. (BREAKFAST AND SUPPER) 56 tablet 0   . potassium chloride SA (KLOR-CON) 20 MEQ tablet Take 20 mEq by mouth 2 (two) times daily.      . potassium chloride SA (KLOR-CON) 20 MEQ tablet TAKE 1 TABLET BY MOUTH TWICE DAILY 60 tablet 11   . primidone (MYSOLINE) 50 MG tablet TAKE ONE TABLET BY MOUTH AT BEDTIME. 30 tablet 5   . Probiotic Product (PROBIOTIC PO) Take 1 tablet by mouth at bedtime.     . triamcinolone lotion (KENALOG) 0.1 % Apply 1 application topically 3 (three) times daily. For up to 14 days and as needed 60 mL 1   . TRUE METRIX BLOOD GLUCOSE TEST test strip USE TO TEST BLOOD SUGAR AS DIRECTED. 50 each 2   . Vitamin A 2400 MCG (8000 UT) CAPS Take 8,000 Units by mouth every morning.      . warfarin (COUMADIN) 4 MG tablet Take 2 tablets tonight, 1 1/2 tablets tomorrow night then increase dose to 1 tablet daily 90 tablet 2     Assessment: 69 yo lady to continue coumadin for afib.  INR 2.1 on admit.  Hg 12.5, PTLC 230 Goal of Therapy:  INR 2-3 Monitor  platelets by anticoagulation protocol: Yes   Plan:  Daily PT/INR F/u home dose  Tannar Broker [place] 03/20/2020,11:11 PM

## 2020-03-20 NOTE — ED Notes (Signed)
admitting Provider at bedside. 

## 2020-03-21 ENCOUNTER — Other Ambulatory Visit: Payer: Self-pay | Admitting: *Deleted

## 2020-03-21 DIAGNOSIS — E1122 Type 2 diabetes mellitus with diabetic chronic kidney disease: Secondary | ICD-10-CM

## 2020-03-21 DIAGNOSIS — E1165 Type 2 diabetes mellitus with hyperglycemia: Secondary | ICD-10-CM

## 2020-03-21 DIAGNOSIS — J9611 Chronic respiratory failure with hypoxia: Secondary | ICD-10-CM

## 2020-03-21 DIAGNOSIS — E876 Hypokalemia: Secondary | ICD-10-CM

## 2020-03-21 DIAGNOSIS — E1100 Type 2 diabetes mellitus with hyperosmolarity without nonketotic hyperglycemic-hyperosmolar coma (NKHHC): Principal | ICD-10-CM

## 2020-03-21 DIAGNOSIS — J449 Chronic obstructive pulmonary disease, unspecified: Secondary | ICD-10-CM

## 2020-03-21 DIAGNOSIS — G4733 Obstructive sleep apnea (adult) (pediatric): Secondary | ICD-10-CM

## 2020-03-21 DIAGNOSIS — I480 Paroxysmal atrial fibrillation: Secondary | ICD-10-CM

## 2020-03-21 DIAGNOSIS — N1832 Chronic kidney disease, stage 3b: Secondary | ICD-10-CM

## 2020-03-21 DIAGNOSIS — Z794 Long term (current) use of insulin: Secondary | ICD-10-CM

## 2020-03-21 LAB — GLUCOSE, CAPILLARY
Glucose-Capillary: 112 mg/dL — ABNORMAL HIGH (ref 70–99)
Glucose-Capillary: 134 mg/dL — ABNORMAL HIGH (ref 70–99)
Glucose-Capillary: 152 mg/dL — ABNORMAL HIGH (ref 70–99)
Glucose-Capillary: 156 mg/dL — ABNORMAL HIGH (ref 70–99)
Glucose-Capillary: 176 mg/dL — ABNORMAL HIGH (ref 70–99)
Glucose-Capillary: 187 mg/dL — ABNORMAL HIGH (ref 70–99)
Glucose-Capillary: 223 mg/dL — ABNORMAL HIGH (ref 70–99)
Glucose-Capillary: 232 mg/dL — ABNORMAL HIGH (ref 70–99)
Glucose-Capillary: 235 mg/dL — ABNORMAL HIGH (ref 70–99)
Glucose-Capillary: 237 mg/dL — ABNORMAL HIGH (ref 70–99)
Glucose-Capillary: 256 mg/dL — ABNORMAL HIGH (ref 70–99)

## 2020-03-21 LAB — BASIC METABOLIC PANEL
Anion gap: 14 (ref 5–15)
Anion gap: 14 (ref 5–15)
BUN: 123 mg/dL — ABNORMAL HIGH (ref 8–23)
BUN: 116 mg/dL — ABNORMAL HIGH (ref 8–23)
CO2: 36 mmol/L — ABNORMAL HIGH (ref 22–32)
CO2: 34 mmol/L — ABNORMAL HIGH (ref 22–32)
Calcium: 8.5 mg/dL — ABNORMAL LOW (ref 8.9–10.3)
Calcium: 8.2 mg/dL — ABNORMAL LOW (ref 8.9–10.3)
Chloride: 80 mmol/L — ABNORMAL LOW (ref 98–111)
Chloride: 83 mmol/L — ABNORMAL LOW (ref 98–111)
Creatinine, Ser: 2.02 mg/dL — ABNORMAL HIGH (ref 0.44–1.00)
Creatinine, Ser: 2.03 mg/dL — ABNORMAL HIGH (ref 0.44–1.00)
GFR, Estimated: 25 mL/min — ABNORMAL LOW (ref >60)
GFR, Estimated: 24 mL/min — ABNORMAL LOW (ref >60)
Glucose, Bld: 137 mg/dL — ABNORMAL HIGH (ref 70–99)
Glucose, Bld: 259 mg/dL — ABNORMAL HIGH (ref 70–99)
Potassium: 3.1 mmol/L — ABNORMAL LOW (ref 3.5–5.1)
Potassium: 2.8 mmol/L — ABNORMAL LOW (ref 3.5–5.1)
Sodium: 130 mmol/L — ABNORMAL LOW (ref 135–145)
Sodium: 131 mmol/L — ABNORMAL LOW (ref 135–145)

## 2020-03-21 LAB — PROTIME-INR
INR: 2.4 — ABNORMAL HIGH (ref 0.8–1.2)
Prothrombin Time: 25 seconds — ABNORMAL HIGH (ref 11.4–15.2)

## 2020-03-21 LAB — HIV ANTIBODY (ROUTINE TESTING W REFLEX): HIV Screen 4th Generation wRfx: NONREACTIVE

## 2020-03-21 LAB — MRSA PCR SCREENING: MRSA by PCR: NEGATIVE

## 2020-03-21 MED ORDER — INSULIN DETEMIR 100 UNIT/ML ~~LOC~~ SOLN
60.0000 [IU] | SUBCUTANEOUS | Status: DC
  Administered 2020-03-21 – 2020-03-22 (×2): 60 [IU] via SUBCUTANEOUS
  Filled 2020-03-21 (×3): qty 0.6

## 2020-03-21 MED ORDER — INSULIN ASPART 100 UNIT/ML ~~LOC~~ SOLN
0.0000 [IU] | Freq: Three times a day (TID) | SUBCUTANEOUS | Status: DC
  Administered 2020-03-21: 7 [IU] via SUBCUTANEOUS
  Administered 2020-03-21: 4 [IU] via SUBCUTANEOUS
  Administered 2020-03-21: 7 [IU] via SUBCUTANEOUS
  Administered 2020-03-22: 11 [IU] via SUBCUTANEOUS

## 2020-03-21 MED ORDER — POTASSIUM CHLORIDE 2 MEQ/ML IV SOLN
INTRAVENOUS | Status: DC
  Filled 2020-03-21 (×5): qty 1000

## 2020-03-21 MED ORDER — WARFARIN - PHARMACIST DOSING INPATIENT: Freq: Every day | Status: DC

## 2020-03-21 MED ORDER — POTASSIUM CHLORIDE CRYS ER 20 MEQ PO TBCR
40.0000 meq | EXTENDED_RELEASE_TABLET | ORAL | Status: AC
  Administered 2020-03-21 – 2020-03-22 (×3): 40 meq via ORAL
  Filled 2020-03-21 (×3): qty 2

## 2020-03-21 MED ORDER — WARFARIN SODIUM 2 MG PO TABS
4.0000 mg | ORAL_TABLET | Freq: Once | ORAL | Status: AC
  Administered 2020-03-21: 4 mg via ORAL
  Filled 2020-03-21: qty 2

## 2020-03-21 MED ORDER — CHLORHEXIDINE GLUCONATE CLOTH 2 % EX PADS
6.0000 | MEDICATED_PAD | Freq: Every day | CUTANEOUS | Status: DC
  Administered 2020-03-21: 6 via TOPICAL

## 2020-03-21 MED ORDER — INSULIN ASPART 100 UNIT/ML ~~LOC~~ SOLN
0.0000 [IU] | Freq: Every day | SUBCUTANEOUS | Status: DC
  Administered 2020-03-21: 2 [IU] via SUBCUTANEOUS

## 2020-03-21 NOTE — Telephone Encounter (Signed)
lmtcb for pt. Will try once more to reach pt due to nature of call.

## 2020-03-21 NOTE — Progress Notes (Signed)
ANTICOAGULATION CONSULT NOTE - Initial Consult  Pharmacy Consult for warfarin Indication: atrial fibrillation  Allergies  Allergen Reactions  . Oseltamivir Hives    Other reaction(s): Other (see comments) "water blisters"  . Xarelto [Rivaroxaban] Other (See Comments)    Internal bleeding  . Ancef [Cefazolin] Nausea And Vomiting  . Levaquin [Levofloxacin In D5w] Other (See Comments)    "afib"  . Lyrica [Pregabalin] Hives  . Tamiflu [Oseltamivir Phosphate] Other (See Comments)    "water blisters"  . Zoloft [Sertraline Hcl] Other (See Comments)    Jaw problems, jittery  . Torsemide Other (See Comments)    Not effective  . Augmentin [Amoxicillin-Pot Clavulanate] Itching  . Ciprofloxacin Itching and Nausea And Vomiting  . Haldol [Haloperidol] Other (See Comments)    Restless leg  . Nsaids Diarrhea  . Penicillins Itching, Nausea And Vomiting and Rash    Has patient had a PCN reaction causing immediate rash, facial/tongue/throat swelling, SOB or lightheadedness with hypotension: Yes Has patient had a PCN reaction causing severe rash involving mucus membranes or skin necrosis: No Has patient had a PCN reaction that required hospitalization No Has patient had a PCN reaction occurring within the last 10 years: Yes If all of the above answers are "NO", then may proceed with Cephalosporin use.  . Topamax [Topiramate] Nausea Only    Patient Measurements: Height: 4\' 11"  (149.9 cm) Weight: 77.1 kg (170 lb) IBW/kg (Calculated) : 43.2 Heparin Dosing Weight:  Vital Signs: Temp: 98.4 F (36.9 C) (10/19 0728) Temp Source: Oral (10/19 0728) BP: 87/39 (10/19 0728) Pulse Rate: 49 (10/19 0728)  Labs: Recent Labs    03/20/20 1604 03/20/20 1604 03/20/20 1842 03/21/20 0204 03/21/20 0703  HGB 12.5  --   --   --   --   HCT 36.2  --   --   --   --   PLT 230  --   --   --   --   LABPROT 22.9*  --   --  25.0*  --   INR 2.1*  --   --  2.4*  --   CREATININE 2.85*   < > 2.73* 2.02* 2.03*    < > = values in this interval not displayed.    Estimated Creatinine Clearance: 23.5 mL/min (A) (by C-G formula based on SCr of 2.03 mg/dL (H)).   Medical History: Past Medical History:  Diagnosis Date  . Allergy   . Anemia   . Anxiety   . Asthma   . Atrial fibrillation ([place])   . Bipolar 1 disorder ([place])   . 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 Cholelithiasis    a. 09/2016 s/p Lap Chole.  . Chronic bronchitis ([place])   . Chronic combined systolic and diastolic congestive heart failure ([place])    a. 09/2016 Echo: EF 30-35%.  . Colon polyps   . COPD (chronic obstructive pulmonary disease) ([place])   . Depression   . Family history of adverse reaction to anesthesia    Uncle was positive for malignant hyperthermia; patient had testing done and was negative.  . Fibromyalgia   . GERD (gastroesophageal reflux disease)   . Gout   . High cholesterol   . History of blood transfusion 06/2015   "bleeding from my rectum"  . History of hiatal hernia   . HOH (hard of hearing)   . Hx of colonic polyps 03/21/2016   3 small adenomas no recall - co-morbidities  . Neuropathy    Disc  Back   . NICM (nonischemic cardiomyopathy) ([place])    a. Previously worked up in [place], MD-->low EF with subsequent recovery.  Multiple caths (last ~ 2014 per pt report)--reportedly nl cors;  b. 09/2016 Echo: EF 30-35%, antsept/apical HK, mild MR, mildly dil LA, mod dil RA;  c. 09/2016 Lexi MV: EF 26%, glob HK, sept DK, med size, mod intensity fixed septal defect - BBB/PVC related artifact, no ischemia.  . On home oxygen therapy    "3L; 24/7" (03/14/2016)  . OSA treated with BiPAP    uses biPAP, 10 (03/14/2016)  . Osteoarthritis   . Oxygen deficiency   . Pneumonia   . Type II diabetes mellitus (HCC)     Medications:  Medications Prior to Admission  Medication Sig Dispense Refill Last Dose  . albuterol (PROVENTIL) (2.5 MG/3ML) 0.083% nebulizer solution Take 3 mLs (2.5 mg total) by nebulization  every 6 (six) hours as needed for wheezing or shortness of breath. 75 mL 5   . allopurinol (ZYLOPRIM) 100 MG tablet TAKE 1 TABLET BY MOUTH ONCE DAILY. 30 tablet 0   . budesonide (PULMICORT) 0.5 MG/2ML nebulizer solution Take 2 mLs (0.5 mg total) by nebulization 2 (two) times daily. 120 mL 11   . cetirizine (ZYRTEC) 10 MG tablet Take 1 tablet (10 mg total) by mouth at bedtime. 30 tablet 11   . cholecalciferol (VITAMIN D) 400 units TABS tablet Take 1 tablet (400 Units total) by mouth 2 (two) times daily. 180 each 3   . clobetasol cream (TEMOVATE) 0.05 %      . colchicine 0.6 MG tablet Take 1 tablet (0.6 mg total) by mouth as needed.     Marland Kitchen escitalopram (LEXAPRO) 10 MG tablet Take 1 tablet (10 mg total) by mouth at bedtime. 90 tablet 0   . FEROSUL 325 (65 Fe) MG tablet Take 1 tablet by mouth daily.     . fluticasone (FLONASE) 50 MCG/ACT nasal spray Place 1 spray into both nostrils 2 (two) times daily.     . furosemide (LASIX) 40 MG tablet TAKE (2) TABLETS BY MOUTH THREE TIMES DAILY. 180 tablet 3   . Insulin Glargine (BASAGLAR KWIKPEN) 100 UNIT/ML SOPN Inject 0.6 mLs (60 Units total) into the skin at bedtime. (Patient taking differently: Inject 40 Units into the skin 2 (two) times daily. ) 15 mL 2   . insulin lispro (HUMALOG KWIKPEN) 100 UNIT/ML KiwkPen Inject 0.1-0.16 mLs (10-16 Units total) into the skin 3 (three) times daily before meals. (Patient taking differently: Inject 16-18 Units into the skin 3 (three) times daily before meals. ) 15 mL 2   . Insulin Pen Needle 32G X 4 MM MISC Use to inject insulin 5 times daily 500 each 5   . ipratropium (ATROVENT) 0.02 % nebulizer solution Take 2.5 mLs (0.5 mg total) by nebulization every 6 (six) hours as needed for wheezing or shortness of breath. 62.5 mL 5   . losartan (COZAAR) 25 MG tablet TAKE (1/2) TABLET BY MOUTH ONCE DAILY. 15 tablet 11   . metolazone (ZAROXOLYN) 5 MG tablet Take 1 tablet (5 mg total) by mouth daily. 30 tablet 6   . metoprolol succinate  (TOPROL-XL) 25 MG 24 hr tablet TAKE 1 TABLET BY MOUTH TWICE DAILY 60 tablet 11   . mirtazapine (REMERON) 15 MG tablet Take 1 tablet (15 mg total) by mouth at bedtime. 90 tablet 0   . Misc Natural Products (TART CHERRY ADVANCED PO) Take by mouth daily.     . modafinil (  PROVIGIL) 200 MG tablet Take 1 tablet (200 mg total) by mouth daily. 30 tablet 5   . mometasone (NASONEX) 50 MCG/ACT nasal spray Place 1 spray into the nose daily. 17 g 11   . montelukast (SINGULAIR) 10 MG tablet Take 1 tablet (10 mg total) by mouth at bedtime. 30 tablet 5   . mupirocin cream (BACTROBAN) 2 % Apply 1 application topically 2 (two) times daily. 15 g 0   . nitroGLYCERIN (NITROSTAT) 0.4 MG SL tablet Place 0.4 mg under the tongue every 5 (five) minutes as needed for chest pain.      Marland Kitchen nystatin (NYSTATIN) powder Apply topically 2 (two) times daily. Apply under breasts 15 g 3   . ondansetron (ZOFRAN) 4 MG tablet Take 4 mg by mouth once.     Marland Kitchen oxyCODONE-acetaminophen (PERCOCET/ROXICET) 5-325 MG tablet Take 1 tablet by mouth 2 (two) times daily as needed for severe pain. 60 tablet 0   . pantoprazole (PROTONIX) 20 MG tablet TAKE (1) TABLET BY MOUTH TWICE A DAY BEFORE MEALS. (BREAKFAST AND SUPPER) 56 tablet 0   . potassium chloride SA (KLOR-CON) 20 MEQ tablet Take 20 mEq by mouth 2 (two) times daily.      . potassium chloride SA (KLOR-CON) 20 MEQ tablet TAKE 1 TABLET BY MOUTH TWICE DAILY 60 tablet 11   . primidone (MYSOLINE) 50 MG tablet TAKE ONE TABLET BY MOUTH AT BEDTIME. 30 tablet 5   . Probiotic Product (PROBIOTIC PO) Take 1 tablet by mouth at bedtime.     . triamcinolone lotion (KENALOG) 0.1 % Apply 1 application topically 3 (three) times daily. For up to 14 days and as needed 60 mL 1   . TRUE METRIX BLOOD GLUCOSE TEST test strip USE TO TEST BLOOD SUGAR AS DIRECTED. 50 each 2   . Vitamin A 2400 MCG (8000 UT) CAPS Take 8,000 Units by mouth every morning.      . warfarin (COUMADIN) 4 MG tablet Take 2 tablets tonight, 1 1/2  tablets tomorrow night then increase dose to 1 tablet daily 90 tablet 2     Assessment: Pharmacy consulted to dose warfarin in patient with atrial fibrillation.  Current INR is therapeutic at 2.4.  Home dose listed as 4 mg every day per Anti-coag visit on 03/02/20.  CBC WNL  Goal of Therapy:  INR 2-3 Monitor platelets by anticoagulation protocol: Yes   Plan:  Warfarin 4 mg x 1 dose. Monitor daily INR and s/s of bleeding.  Margot Ables, PharmD Clinical Pharmacist 03/21/2020 11:09 AM

## 2020-03-21 NOTE — Progress Notes (Signed)
PROGRESS NOTE    GIULIA HICKEY  KGU:[id number] DOB: 1951-01-09 DOA: 03/20/2020 PCP: [name], NP   Chief Complaint  Patient presents with  . Fall    Brief Narrative:  As per H&P written by Dr. Denton Brick on 03/20/20 Gabrielle Dare Clingan is a 69 y.o. female with medical history significant for  DM2, atrial fibrillation, systolic and diastolic CHF, chronic respiratory failure on 3 L, COPD, CKD 3. Patient presented to the ED with complaints of dizziness intermittent since she got her first dose of covid vaccine last month September (pfizer).  Dizziness is present only when standing up, and she reports dizziness is present most days.  Patient reports she fell today, she did not hit her head, and she did not lose consciousness.  She did hit the left side of her keep against her drawer. Patient reports daily compliance with her insulins which include dose and sliding scale Humalog.  She last took her medications this morning, and tells me her blood sugar this morning was 150. She also reports compliance with her torsemide.  No vomiting no loose stools. She reports dry cough over the past 3 days.  She denies difficulty breathing.  No chest pain.  ED Course: temp 98.4,  Heart rate 50s to 70s, blood pressure 108 - 132, O2 sats 95% on 3 L, also documentation that patient O2 sats dropped to 87 to 88% on 3 L and increased to 92% on 4 L, also patient was was yelling that she cannot lay down as she felt like she was losing her air-patient tells me she was just uncomfortable while she was lying down flat. Blood glucose 761, potassium 3, sodium 119, creatinine elevated 2.85, WBC 11.5. Pelvic x-ray was without acute abnormality.  Head CT also unremarkable.  Patient was started on insulin drip, 2.5 L bolus of fluids given.  Hospitalist to admit for hyperglycemia and AKI.  Assessment & Plan: 1-hyperosmolar nonketotic hyperglycemia -Patient blood sugar on presentation 740 -Responded well to fluid  resuscitation and IV insulin therapy -Currently transition off insulin therapy and using sliding scale insulin and long-acting insulin -Blood sugar fluctuating and requiring further adjustment to hypoglycemic regimen for better control. -Modified carbohydrate diet has been ordered -Follow CBGs -A1c in progress  2-acute kidney injury on chronic kidney disease stage IIIb -In the setting of dehydration and continue use of nephrotoxic agents -Creatinine trending down -Continue holding nephrotoxic agents -Continue fluid resuscitation -Follow trend.  3-hypokalemia and pseudohyponatremia -In the setting of dehydration and hyperglycemia -Sodium level now within normal limits after correction -Potassium is still low and will continue supplementation -Follow magnesium level in the morning.  4-chronic respiratory failure secondary to COPD and obstructive sleep apnea -Continue the use of BIPAP nightly -Continue oxygen supplementation chronically use at 3 L on daily basis -Currently no wheezing -Continue home inhaler/nebulizer management.  5-gastroesophageal reflux disease -Continue PPI  6-history of atrial fibrillation (paroxysmal and chronic) -Rate control and currently sinus -Continue metoprolol and Coumadin for secondary prevention  7-hypertension -Stable overall -Continue holding ARB and diuretics currently in the setting of acute kidney injury. -Continue metoprolol. -Follow vital signs.  8-physical deconditioning and unstable gait -Will check vitamin D and B12 level -Physical therapy has been called to assess patient and provide recommendations.   Hyperglycemia  9-history of chronic pain -Continue home pain medication.   DVT prophylaxis: Chronically on Coumadin Code Status: Full code Family Communication: Husband at bedside Disposition:   Status is: Inpatient  Dispo: The patient is from: Home  Anticipated d/c is to: Home with home health services               Anticipated d/c date is: 1-2 days              Patient currently is not medically ready for discharge; still using patient insulin therapy for better control of her blood sugar and also providing fluid resuscitation and following her renal function which is still not back to baseline.  Will ask physical therapy to assess patient given history of acute impaired ambulation.    Consultants:   None  Procedures:  See below for x-ray reports.   Antimicrobials:  None   Subjective: No fever. Patient reports having some difficulty with ambulation and feeling weak and tired.  No nausea, no vomiting, no chest pain and expressing breathing to be at baseline.   Objective: Vitals:   03/21/20 1100 03/21/20 1119 03/21/20 1200 03/21/20 1611  BP: 110/79  (!) 85/43   Pulse: (!) 54 76 75 74  Resp: 10 (!) 28 17 (!) 26  Temp:  97.9 F (36.6 C)  97.9 F (36.6 C)  TempSrc:  Oral  Oral  SpO2: 100% 100% 100% 100%  Weight:      Height:        Intake/Output Summary (Last 24 hours) at 03/21/2020 1810 Last data filed at 03/21/2020 1300 Gross per 24 hour  Intake 1955.27 ml  Output 300 ml  Net 1655.27 ml   Filed Weights   03/20/20 1427  Weight: 77.1 kg    Examination:  General exam: Appears calm and comfortable; no chest pain, no nausea or vomiting.  Reports feeling better.  Patient expressed having some difficulty with ambulation. Respiratory system: Good air movement bilaterally; no wheezing, positive rhonchi.  3 L nasal cannula supplementation (chronic for her). Cardiovascular system: Regular rate, no rubs, no gallops, no JVD.  Trace edema appreciated on exam. Gastrointestinal system: Abdomen is nondistended, soft and nontender. No organomegaly or masses felt. Normal bowel sounds heard. Central nervous system: Alert and oriented. No focal neurological deficits. Extremities: No cyanosis or clubbing Skin: No no petechiae. Psychiatry: Judgement and insight appear normal. Mood & affect  appropriate.     Data Reviewed: I have personally reviewed following labs and imaging studies  CBC: Recent Labs  Lab 03/20/20 1604  WBC 11.5*  NEUTROABS 9.7*  HGB 12.5  HCT 36.2  MCV 83.6  PLT 867    Basic Metabolic Panel: Recent Labs  Lab 03/20/20 1604 03/20/20 1842 03/21/20 0204 03/21/20 0703  NA 119* 121* 130* 131*  K 3.0* 3.3* 3.1* 2.8*  CL 64* 67* 80* 83*  CO2 36* 32 36* 34*  GLUCOSE 761* 739* 137* 259*  BUN 136* 136* 123* 116*  CREATININE 2.85* 2.73* 2.02* 2.03*  CALCIUM 8.5* 8.5* 8.5* 8.2*  MG  --  2.1  --   --     GFR: Estimated Creatinine Clearance: 23.5 mL/min (A) (by C-G formula based on SCr of 2.03 mg/dL (H)).  CBG: Recent Labs  Lab 03/21/20 0553 03/21/20 0631 03/21/20 0727 03/21/20 1120 03/21/20 1609  GLUCAP 223* 256* 235* 237* 176*     Recent Results (from the past 240 hour(s))  Resp Panel by RT PCR (RSV, Flu A&B, Covid) - Nasopharyngeal Swab     Status: None   Collection Time: 03/20/20  5:05 PM   Specimen: Nasopharyngeal Swab  Result Value Ref Range Status   SARS Coronavirus 2 by RT PCR NEGATIVE NEGATIVE Final    Comment: (  NOTE) SARS-CoV-2 target nucleic acids are NOT DETECTED.  The SARS-CoV-2 RNA is generally detectable in upper respiratoy specimens during the acute phase of infection. The lowest concentration of SARS-CoV-2 viral copies this assay can detect is 131 copies/mL. A negative result does not preclude SARS-Cov-2 infection and should not be used as the sole basis for treatment or other patient management decisions. A negative result may occur with  improper specimen collection/handling, submission of specimen other than nasopharyngeal swab, presence of viral mutation(s) within the areas targeted by this assay, and inadequate number of viral copies (<131 copies/mL). A negative result must be combined with clinical observations, patient history, and epidemiological information. The expected result is Negative.  Fact Sheet  for Patients:  PinkCheek.be  Fact Sheet for Healthcare Providers:  GravelBags.it  This test is no t yet approved or cleared by the [place] FDA and  has been authorized for detection and/or diagnosis of SARS-CoV-2 by FDA under an Emergency Use Authorization (EUA). This EUA will remain  in effect (meaning this test can be used) for the duration of the COVID-19 declaration under Section 564(b)(1) of the Act, 21 U.S.C. section 360bbb-3(b)(1), unless the authorization is terminated or revoked sooner.     Influenza A by PCR NEGATIVE NEGATIVE Final   Influenza B by PCR NEGATIVE NEGATIVE Final    Comment: (NOTE) The Xpert Xpress SARS-CoV-2/FLU/RSV assay is intended as an aid in  the diagnosis of influenza from Nasopharyngeal swab specimens and  should not be used as a sole basis for treatment. Nasal washings and  aspirates are unacceptable for Xpert Xpress SARS-CoV-2/FLU/RSV  testing.  Fact Sheet for Patients: PinkCheek.be  Fact Sheet for Healthcare Providers: GravelBags.it  This test is not yet approved or cleared by the [place] FDA and  has been authorized for detection and/or diagnosis of SARS-CoV-2 by  FDA under an Emergency Use Authorization (EUA). This EUA will remain  in effect (meaning this test can be used) for the duration of the  Covid-19 declaration under Section 564(b)(1) of the Act, 21  U.S.C. section 360bbb-3(b)(1), unless the authorization is  terminated or revoked.    Respiratory Syncytial Virus by PCR NEGATIVE NEGATIVE Final    Comment: (NOTE) Fact Sheet for Patients: PinkCheek.be  Fact Sheet for Healthcare Providers: GravelBags.it  This test is not yet approved or cleared by the [place] FDA and  has been authorized for detection and/or diagnosis of SARS-CoV-2 by  FDA under  an Emergency Use Authorization (EUA). This EUA will remain  in effect (meaning this test can be used) for the duration of the  COVID-19 declaration under Section 564(b)(1) of the Act, 21 U.S.C.  section 360bbb-3(b)(1), unless the authorization is terminated or  revoked. Performed at [facility], [address], [place], [place] [address]   MRSA PCR Screening     Status: None   Collection Time: 03/20/20 10:13 PM   Specimen: Nasal Mucosa; Nasopharyngeal  Result Value Ref Range Status   MRSA by PCR NEGATIVE NEGATIVE Final    Comment:        The GeneXpert MRSA Assay (FDA approved for NASAL specimens only), is one component of a comprehensive MRSA colonization surveillance program. It is not intended to diagnose MRSA infection nor to guide or monitor treatment for MRSA infections. Performed at [facility], [address], [place], [place] [address]      Radiology Studies: CT Head Wo Contrast  Result Date: 03/20/2020 CLINICAL DATA:  Dizzy spells since COVID vaccine EXAM: CT HEAD  WITHOUT CONTRAST TECHNIQUE: Contiguous axial images were obtained from the base of the skull through the vertex without intravenous contrast. COMPARISON:  CT brain 12/11/2015 FINDINGS: Brain: No acute territorial infarction, hemorrhage or intracranial mass. The ventricles are nonenlarged. Vascular: No hyperdense vessel or unexpected calcification. Skull: Normal. Negative for fracture or focal lesion. Sinuses/Orbits: No acute finding.  Frothy debris in the nasopharynx. Other: None IMPRESSION: Negative non contrasted CT appearance of the brain. Electronically Signed   By: Donavan Foil M.D.   On: 03/20/2020 16:37   DG CHEST PORT 1 VIEW  Result Date: 03/20/2020 CLINICAL DATA:  Fall, cough EXAM: PORTABLE CHEST 1 VIEW COMPARISON:  04/27/2019 FINDINGS: Surgical hardware in the cervical spine. Hyperinflated lungs. Cardiac enlargement without overt edema or pleural effusion. No focal consolidation. IMPRESSION: No active  disease. Mild cardiomegaly. Electronically Signed   By: Donavan Foil M.D.   On: 03/20/2020 20:04   DG Hip Unilat W or Wo Pelvis 2-3 Views Left  Result Date: 03/20/2020 CLINICAL DATA:  Fall EXAM: DG HIP (WITH OR WITHOUT PELVIS) 2-3V LEFT COMPARISON:  None. FINDINGS: There is no acute fracture or dislocation. Mild changes of osteoarthritis. Bladder stimulator is present. IMPRESSION: No acute fracture. Electronically Signed   By: Macy Mis M.D.   On: 03/20/2020 16:28     Scheduled Meds: . budesonide  0.5 mg Nebulization BID  . Chlorhexidine Gluconate Cloth  6 each Topical Daily  . guaiFENesin-dextromethorphan  10 mL Oral Q6H  . insulin aspart  0-20 Units Subcutaneous TID WC  . insulin aspart  0-5 Units Subcutaneous QHS  . insulin detemir  60 Units Subcutaneous Q24H  . potassium chloride  40 mEq Oral Q4H  . Warfarin - Pharmacist Dosing Inpatient   Does not apply q1600   Continuous Infusions: . lactated ringers with kcl 75 mL/hr at 03/21/20 1353     LOS: 1 day    Time spent: 35 minutes    Barton Dubois, MD [facility]   To contact the attending provider between 7A-7P or the covering provider during after hours 7P-7A, please log into the web site [email or web address] and access using universal [place] password for that web site. If you do not have the password, please call the hospital operator.  03/21/2020, 6:10 PM

## 2020-03-21 NOTE — Patient Outreach (Signed)
[facility]) Care Management  03/21/2020  Denise Freeman April 18, 1951 [id number]  Care Coordination  Chronic care management  Inpatient admission    Patient admitted to [facility] on 03/20/20, Dx: hyperglycemia.   Plan Will notify [place] of inpatient admission and follow for discharge disposition plans.    Joylene Draft, RN, BSN  [place] Management Coordinator  [phone]- Mobile [phone]- Toll Free Main Office

## 2020-03-22 ENCOUNTER — Encounter (INDEPENDENT_AMBULATORY_CARE_PROVIDER_SITE_OTHER): Payer: Medicare Other | Admitting: Ophthalmology

## 2020-03-22 DIAGNOSIS — I5022 Chronic systolic (congestive) heart failure: Secondary | ICD-10-CM

## 2020-03-22 DIAGNOSIS — H5316 Psychophysical visual disturbances: Secondary | ICD-10-CM

## 2020-03-22 DIAGNOSIS — E1165 Type 2 diabetes mellitus with hyperglycemia: Secondary | ICD-10-CM

## 2020-03-22 DIAGNOSIS — H43821 Vitreomacular adhesion, right eye: Secondary | ICD-10-CM

## 2020-03-22 DIAGNOSIS — H43822 Vitreomacular adhesion, left eye: Secondary | ICD-10-CM

## 2020-03-22 DIAGNOSIS — H53419 Scotoma involving central area, unspecified eye: Secondary | ICD-10-CM

## 2020-03-22 DIAGNOSIS — M109 Gout, unspecified: Secondary | ICD-10-CM

## 2020-03-22 DIAGNOSIS — H47619 Cortical blindness, unspecified side of brain: Secondary | ICD-10-CM

## 2020-03-22 DIAGNOSIS — Z6834 Body mass index (BMI) 34.0-34.9, adult: Secondary | ICD-10-CM

## 2020-03-22 DIAGNOSIS — E876 Hypokalemia: Secondary | ICD-10-CM

## 2020-03-22 DIAGNOSIS — E1100 Type 2 diabetes mellitus with hyperosmolarity without nonketotic hyperglycemic-hyperosmolar coma (NKHHC): Secondary | ICD-10-CM

## 2020-03-22 DIAGNOSIS — R2681 Unsteadiness on feet: Secondary | ICD-10-CM

## 2020-03-22 DIAGNOSIS — E6609 Other obesity due to excess calories: Secondary | ICD-10-CM

## 2020-03-22 DIAGNOSIS — Z961 Presence of intraocular lens: Secondary | ICD-10-CM

## 2020-03-22 DIAGNOSIS — N179 Acute kidney failure, unspecified: Secondary | ICD-10-CM

## 2020-03-22 LAB — VITAMIN B12: Vitamin B-12: 833 pg/mL (ref 180–914)

## 2020-03-22 LAB — BASIC METABOLIC PANEL
Anion gap: 15 (ref 5–15)
BUN: 99 mg/dL — ABNORMAL HIGH (ref 8–23)
CO2: 34 mmol/L — ABNORMAL HIGH (ref 22–32)
Calcium: 8.7 mg/dL — ABNORMAL LOW (ref 8.9–10.3)
Chloride: 85 mmol/L — ABNORMAL LOW (ref 98–111)
Creatinine, Ser: 1.71 mg/dL — ABNORMAL HIGH (ref 0.44–1.00)
GFR, Estimated: 30 mL/min — ABNORMAL LOW (ref >60)
Glucose, Bld: 335 mg/dL — ABNORMAL HIGH (ref 70–99)
Potassium: 4.2 mmol/L (ref 3.5–5.1)
Sodium: 134 mmol/L — ABNORMAL LOW (ref 135–145)

## 2020-03-22 LAB — MAGNESIUM: Magnesium: 1.6 mg/dL — ABNORMAL LOW (ref 1.7–2.4)

## 2020-03-22 LAB — HEMOGLOBIN A1C
Hgb A1c MFr Bld: 13.5 % — ABNORMAL HIGH (ref 4.8–5.6)
Mean Plasma Glucose: 341 mg/dL

## 2020-03-22 LAB — VITAMIN D 25 HYDROXY (VIT D DEFICIENCY, FRACTURES): Vit D, 25-Hydroxy: 15.92 ng/mL — ABNORMAL LOW (ref 30–100)

## 2020-03-22 LAB — GLUCOSE, CAPILLARY
Glucose-Capillary: 280 mg/dL — ABNORMAL HIGH (ref 70–99)
Glucose-Capillary: 262 mg/dL — ABNORMAL HIGH (ref 70–99)

## 2020-03-22 LAB — PROTIME-INR
INR: 2.3 — ABNORMAL HIGH (ref 0.8–1.2)
Prothrombin Time: 24.9 seconds — ABNORMAL HIGH (ref 11.4–15.2)

## 2020-03-22 MED ORDER — METOLAZONE 5 MG PO TABS: 5.0000 mg | ORAL_TABLET | ORAL | Status: DC

## 2020-03-22 MED ORDER — MAGNESIUM SULFATE IN D5W 1-5 GM/100ML-% IV SOLN
1.0000 g | Freq: Once | INTRAVENOUS | Status: AC
  Administered 2020-03-22: 1 g via INTRAVENOUS
  Filled 2020-03-22: qty 100

## 2020-03-22 MED ORDER — WARFARIN SODIUM 2 MG PO TABS: 4.0000 mg | ORAL_TABLET | Freq: Once | ORAL | Status: DC

## 2020-03-22 MED ORDER — BASAGLAR KWIKPEN 100 UNIT/ML ~~LOC~~ SOPN: 50.0000 [IU] | PEN_INJECTOR | Freq: Two times a day (BID) | SUBCUTANEOUS | Status: DC

## 2020-03-22 MED ORDER — INSULIN LISPRO 100 UNIT/ML (KWIKPEN): 16.0000 [IU] | PEN_INJECTOR | Freq: Three times a day (TID) | SUBCUTANEOUS | Status: DC

## 2020-03-22 MED ORDER — LOSARTAN POTASSIUM 25 MG PO TABS
12.5000 mg | ORAL_TABLET | Freq: Every day | ORAL | Status: DC
Start: 2020-03-25 — End: 2020-03-27

## 2020-03-22 MED ORDER — VITAMIN D (ERGOCALCIFEROL) 1.25 MG (50000 UNIT) PO CAPS: 50000.0000 [IU] | ORAL_CAPSULE | ORAL | 0 refills | Status: AC

## 2020-03-22 MED ORDER — FUROSEMIDE 40 MG PO TABS
40.0000 mg | ORAL_TABLET | Freq: Every day | ORAL | Status: DC
Start: 2020-03-22 — End: 2020-03-27

## 2020-03-22 MED ORDER — INSULIN DETEMIR 100 UNIT/ML ~~LOC~~ SOLN
40.0000 [IU] | Freq: Two times a day (BID) | SUBCUTANEOUS | Status: DC
  Filled 2020-03-22 (×2): qty 0.4

## 2020-03-22 MED ORDER — MAGNESIUM SULFATE 50 % IJ SOLN: 1.0000 g | Freq: Once | INTRAMUSCULAR | Status: DC

## 2020-03-22 MED ORDER — LORAZEPAM 2 MG/ML IJ SOLN
0.5000 mg | Freq: Once | INTRAMUSCULAR | Status: AC
  Administered 2020-03-22: 0.5 mg via INTRAVENOUS
  Filled 2020-03-22: qty 1

## 2020-03-22 NOTE — Progress Notes (Signed)
ANTICOAGULATION CONSULT NOTE - Pharmacy Consult for warfarin Indication: atrial fibrillation  Allergies  Allergen Reactions  . Oseltamivir Hives    Other reaction(s): Other (see comments) "water blisters"  . Xarelto [Rivaroxaban] Other (See Comments)    Internal bleeding  . Ancef [Cefazolin] Nausea And Vomiting  . Levaquin [Levofloxacin In D5w] Other (See Comments)    "afib"  . Lyrica [Pregabalin] Hives  . Tamiflu [Oseltamivir Phosphate] Other (See Comments)    "water blisters"  . Zoloft [Sertraline Hcl] Other (See Comments)    Jaw problems, jittery  . Torsemide Other (See Comments)    Not effective  . Augmentin [Amoxicillin-Pot Clavulanate] Itching  . Ciprofloxacin Itching and Nausea And Vomiting  . Haldol [Haloperidol] Other (See Comments)    Restless leg  . Nsaids Diarrhea  . Penicillins Itching, Nausea And Vomiting and Rash    Has patient had a PCN reaction causing immediate rash, facial/tongue/throat swelling, SOB or lightheadedness with hypotension: Yes Has patient had a PCN reaction causing severe rash involving mucus membranes or skin necrosis: No Has patient had a PCN reaction that required hospitalization No Has patient had a PCN reaction occurring within the last 10 years: Yes If all of the above answers are "NO", then may proceed with Cephalosporin use.  . Topamax [Topiramate] Nausea Only    Patient Measurements: Height: 4\' 11"  (149.9 cm) Weight: 77.1 kg (170 lb) IBW/kg (Calculated) : 43.2 Heparin Dosing Weight:  Vital Signs: Temp: 97.7 F (36.5 C) (10/20 0900) Temp Source: Oral (10/20 0900) BP: 176/131 (10/20 0808) Pulse Rate: 91 (10/20 0808)  Labs: Recent Labs    03/20/20 1604 03/20/20 1842 03/21/20 0204 03/21/20 0703 03/22/20 0307  HGB 12.5  --   --   --   --   HCT 36.2  --   --   --   --   PLT 230  --   --   --   --   LABPROT 22.9*  --  25.0*  --  24.9*  INR 2.1*  --  2.4*  --  2.3*  CREATININE 2.85*   < > 2.02* 2.03* 1.71*   < > = values  in this interval not displayed.    Estimated Creatinine Clearance: 27.8 mL/min (A) (by C-G formula based on SCr of 1.71 mg/dL (H)).   Medical History: Past Medical History:  Diagnosis Date  . Allergy   . Anemia   . Anxiety   . Asthma   . Atrial fibrillation ([place])   . Bipolar 1 disorder ([place])   . 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 Cholelithiasis    a. 09/2016 s/p Lap Chole.  . Chronic bronchitis ([place])   . Chronic combined systolic and diastolic congestive heart failure ([place])    a. 09/2016 Echo: EF 30-35%.  . Colon polyps   . COPD (chronic obstructive pulmonary disease) ([place])   . Depression   . Family history of adverse reaction to anesthesia    Uncle was positive for malignant hyperthermia; patient had testing done and was negative.  . Fibromyalgia   . GERD (gastroesophageal reflux disease)   . Gout   . High cholesterol   . History of blood transfusion 06/2015   "bleeding from my rectum"  . History of hiatal hernia   . HOH (hard of hearing)   . Hx of colonic polyps 03/21/2016   3 small adenomas no recall - co-morbidities  . Neuropathy    Disc Back   . NICM (nonischemic cardiomyopathy) (  [place])    a. Previously worked up in [place], MD-->low EF with subsequent recovery.  Multiple caths (last ~ 2014 per pt report)--reportedly nl cors;  b. 09/2016 Echo: EF 30-35%, antsept/apical HK, mild MR, mildly dil LA, mod dil RA;  c. 09/2016 Lexi MV: EF 26%, glob HK, sept DK, med size, mod intensity fixed septal defect - BBB/PVC related artifact, no ischemia.  . On home oxygen therapy    "3L; 24/7" (03/14/2016)  . OSA treated with BiPAP    uses biPAP, 10 (03/14/2016)  . Osteoarthritis   . Oxygen deficiency   . Pneumonia   . Type II diabetes mellitus (H[facility])     Medications:  Medications Prior to Admission  Medication Sig Dispense Refill Last Dose  . albuterol (PROVENTIL) (2.5 MG/3ML) 0.083% nebulizer solution Take 3 mLs (2.5 mg total) by nebulization every 6 (six) hours  as needed for wheezing or shortness of breath. 75 mL 5 unk  . allopurinol (ZYLOPRIM) 100 MG tablet TAKE 1 TABLET BY MOUTH ONCE DAILY. (Patient taking differently: Take 100 mg by mouth daily. ) 30 tablet 0 unk  . budesonide (PULMICORT) 0.5 MG/2ML nebulizer solution Take 2 mLs (0.5 mg total) by nebulization 2 (two) times daily. 120 mL 11 unk  . cetirizine (ZYRTEC) 10 MG tablet Take 1 tablet (10 mg total) by mouth at bedtime. 30 tablet 11 unk  . cholecalciferol (VITAMIN D) 400 units TABS tablet Take 1 tablet (400 Units total) by mouth 2 (two) times daily. 180 each 3 unk  . colchicine 0.6 MG tablet Take 1 tablet (0.6 mg total) by mouth as needed. (Patient taking differently: Take 0.6 mg by mouth as needed (gout). )   unk  . escitalopram (LEXAPRO) 10 MG tablet Take 1 tablet (10 mg total) by mouth at bedtime. 90 tablet 0 unk  . FEROSUL 325 (65 Fe) MG tablet Take 325 mg by mouth daily.    unk  . fluticasone (FLONASE) 50 MCG/ACT nasal spray Place 1 spray into both nostrils 2 (two) times daily.   unk  . furosemide (LASIX) 40 MG tablet TAKE (2) TABLETS BY MOUTH THREE TIMES DAILY. (Patient taking differently: Take 80 mg by mouth in the morning, at noon, and at bedtime. ) 180 tablet 3 unk  . Insulin Glargine (BASAGLAR KWIKPEN) 100 UNIT/ML SOPN Inject 0.6 mLs (60 Units total) into the skin at bedtime. (Patient taking differently: Inject 40 Units into the skin 2 (two) times daily. ) 15 mL 2 unk  . insulin lispro (HUMALOG KWIKPEN) 100 UNIT/ML KiwkPen Inject 0.1-0.16 mLs (10-16 Units total) into the skin 3 (three) times daily before meals. (Patient taking differently: Inject 16-18 Units into the skin 3 (three) times daily before meals. ) 15 mL 2 unk  . ipratropium (ATROVENT) 0.02 % nebulizer solution Take 2.5 mLs (0.5 mg total) by nebulization every 6 (six) hours as needed for wheezing or shortness of breath. 62.5 mL 5 unk  . clobetasol cream (TEMOVATE) 4.66 % Apply 1 application topically daily.      . Insulin Pen  Needle 32G X 4 MM MISC Use to inject insulin 5 times daily 500 each 5   . losartan (COZAAR) 25 MG tablet TAKE (1/2) TABLET BY MOUTH ONCE DAILY. (Patient taking differently: Take 12.5 mg by mouth daily. ) 15 tablet 11 unk  . metolazone (ZAROXOLYN) 5 MG tablet Take 1 tablet (5 mg total) by mouth daily. 30 tablet 6 unk  . metoprolol succinate (TOPROL-XL) 25 MG 24 hr tablet TAKE 1 TABLET  BY MOUTH TWICE DAILY (Patient taking differently: Take 25 mg by mouth in the morning and at bedtime. ) 60 tablet 11 unk  . mirtazapine (REMERON) 15 MG tablet Take 1 tablet (15 mg total) by mouth at bedtime. 90 tablet 0 unk  . Misc Natural Products (TART CHERRY ADVANCED PO) Take by mouth daily.   unk  . modafinil (PROVIGIL) 200 MG tablet Take 1 tablet (200 mg total) by mouth daily. 30 tablet 5 unk  . mometasone (NASONEX) 50 MCG/ACT nasal spray Place 1 spray into the nose daily. (Patient not taking: Reported on 03/22/2020) 17 g 11 Completed Course at Unknown time  . montelukast (SINGULAIR) 10 MG tablet Take 1 tablet (10 mg total) by mouth at bedtime. 30 tablet 5 unk  . mupirocin cream (BACTROBAN) 2 % Apply 1 application topically 2 (two) times daily. 15 g 0 unk  . nitroGLYCERIN (NITROSTAT) 0.4 MG SL tablet Place 0.4 mg under the tongue every 5 (five) minutes as needed for chest pain.    unk  . nystatin (NYSTATIN) powder Apply topically 2 (two) times daily. Apply under breasts (Patient not taking: Reported on 03/22/2020) 15 g 3 Completed Course at Unknown time  . ondansetron (ZOFRAN) 4 MG tablet Take 4 mg by mouth once. (Patient not taking: Reported on 03/22/2020)   Not Taking at Unknown time  . oxyCODONE-acetaminophen (PERCOCET/ROXICET) 5-325 MG tablet Take 1 tablet by mouth 2 (two) times daily as needed for severe pain. 60 tablet 0 unk  . pantoprazole (PROTONIX) 20 MG tablet TAKE (1) TABLET BY MOUTH TWICE A DAY BEFORE MEALS. (BREAKFAST AND SUPPER) (Patient taking differently: Take 20 mg by mouth 2 (two) times daily before a  meal. ) 56 tablet 0 unk  . potassium chloride SA (KLOR-CON) 20 MEQ tablet TAKE 1 TABLET BY MOUTH TWICE DAILY (Patient taking differently: Take 20 mEq by mouth 2 (two) times daily. ) 60 tablet 11 unk  . primidone (MYSOLINE) 50 MG tablet TAKE ONE TABLET BY MOUTH AT BEDTIME. (Patient taking differently: Take 50 mg by mouth at bedtime. ) 30 tablet 5 unk  . Probiotic Product (PROBIOTIC PO) Take 1 tablet by mouth at bedtime.   unk  . triamcinolone lotion (KENALOG) 0.1 % Apply 1 application topically 3 (three) times daily. For up to 14 days and as needed (Patient not taking: Reported on 03/22/2020) 60 mL 1 Completed Course at Unknown time  . TRUE METRIX BLOOD GLUCOSE TEST test strip USE TO TEST BLOOD SUGAR AS DIRECTED. 50 each 2   . Vitamin A 2400 MCG (8000 UT) CAPS Take 8,000 Units by mouth every morning.    unk  . warfarin (COUMADIN) 4 MG tablet Take 2 tablets tonight, 1 1/2 tablets tomorrow night then increase dose to 1 tablet daily (Patient taking differently: Take 4 mg by mouth daily. ) 90 tablet 2 unk    Assessment: Pharmacy consulted to dose warfarin in patient with atrial fibrillation.   INR is therapeutic at 2.3.  Home dose listed as 4 mg every day per Anti-coag visit on 03/02/20.  CBC WNL  Goal of Therapy:  INR 2-3 Monitor platelets by anticoagulation protocol: Yes   Plan:  Warfarin 4 mg x 1 dose. Monitor daily INR and s/s of bleeding.  Isac Sarna, BS Pharm D, BCPS Clinical Pharmacist Pager [phone] 03/22/2020 11:10 AM

## 2020-03-22 NOTE — Evaluation (Signed)
Physical Therapy Evaluation Patient Details Name: Denise Freeman MRN: [id number] DOB: [birth year] Today's Date: 03/22/2020   History of Present Illness  Denise Freeman is a 69 y.o. female with medical history significant for  DM2, atrial fibrillation, systolic and diastolic CHF, chronic respiratory failure on 3 L, COPD, CKD 3.Patient presented to the ED with complaints of dizziness intermittent since she got her first dose of covid vaccine last month September (pfizer).  Dizziness is present only when standing up, and she reports dizziness is present most days.  Patient reports she fell today, she did not hit her head, and she did not lose consciousness.  She did hit the left side of her keep against her drawer.Patient reports daily compliance with her insulins which include dose and sliding scale Humalog.  She last took her medications this morning, and tells me her blood sugar this morning was 150.She also reports compliance with her torsemide.  No vomiting no loose stools.She reports dry cough over the past 3 days.  She denies difficulty breathing.  No chest pain.    Clinical Impression  Patient functioning near baseline for functional mobility and gait, demonstrates slightly labored cadence during ambulation without loss of balance, on room air with SpO2 at 94% and tolerated sitting up in chair after therapy.  Plan:  Patient discharged from physical therapy to care of nursing for ambulation daily as tolerated for length of stay.     Follow Up Recommendations Home health PT;Supervision - Intermittent    Equipment Recommendations  3in1 (PT) (Extra duty BSC)    Recommendations for Other Services       Precautions / Restrictions Precautions Precautions: None Restrictions Weight Bearing Restrictions: No      Mobility  Bed Mobility Overal bed mobility: Modified Independent                  Transfers Overall transfer level: Modified independent                   Ambulation/Gait Ambulation/Gait assistance: Modified independent (Device/Increase time) Gait Distance (Feet): 100 Feet Assistive device: 4-wheeled walker Gait Pattern/deviations: Decreased step length - right;Decreased step length - left;Decreased stride length Gait velocity: decreased   General Gait Details: slightly labored cadence without loss of balance, on room air with SpO2 at 94%  Stairs            Wheelchair Mobility    Modified Rankin (Stroke Patients Only)       Balance Overall balance assessment: Needs assistance Sitting-balance support: Feet supported;No upper extremity supported Sitting balance-Leahy Scale: Fair Sitting balance - Comments: fair/good using Rollator                                     Pertinent Vitals/Pain Pain Assessment: No/denies pain    Home Living Family/patient expects to be discharged to:: Private residence Living Arrangements: Spouse/significant other;Other relatives Available Help at Discharge: Family;Available 24 hours/day Type of Home: Mobile home Home Access: Ramped entrance     Home Layout: One level Home Equipment: [place] - 4 wheels;Bedside commode;Shower seat;Electric scooter;Cane - single point      Prior Function Level of Independence: Independent with assistive device(s)         Comments: [employer]     Hand Dominance        Extremity/Trunk Assessment   Upper Extremity Assessment Upper Extremity Assessment: Overall WFL for tasks  assessed    Lower Extremity Assessment Lower Extremity Assessment: Overall WFL for tasks assessed    Cervical / Trunk Assessment Cervical / Trunk Assessment: Normal  Communication   Communication: No difficulties  Cognition Arousal/Alertness: Awake/alert Behavior During Therapy: WFL for tasks assessed/performed Overall Cognitive Status: Within Functional Limits for tasks assessed                                         General Comments      Exercises     Assessment/Plan    PT Assessment All further PT needs can be met in the next venue of care  PT Problem List Decreased strength;Decreased activity tolerance;Decreased balance;Decreased mobility       PT Treatment Interventions      PT Goals (Current goals can be found in the Care Plan section)  Acute Rehab PT Goals Patient Stated Goal: Return home with family to assist PT Goal Formulation: With patient/family Time For Goal Achievement: 03/22/20 Potential to Achieve Goals: Good    Frequency     Barriers to discharge        Co-evaluation               AM-PAC PT "6 Clicks" Mobility  Outcome Measure Help needed turning from your back to your side while in a flat bed without using bedrails?: None Help needed moving from lying on your back to sitting on the side of a flat bed without using bedrails?: None Help needed moving to and from a bed to a chair (including a wheelchair)?: None Help needed standing up from a chair using your arms (e.g., wheelchair or bedside chair)?: None Help needed to walk in hospital room?: A Little Help needed climbing 3-5 steps with a railing? : A Little 6 Click Score: 22    End of Session   Activity Tolerance: Patient tolerated treatment well;Patient limited by fatigue Patient left: in chair;with call bell/phone within reach;with family/visitor present Nurse Communication: Mobility status PT Visit Diagnosis: Unsteadiness on feet (R26.81);Other abnormalities of gait and mobility (R26.89);Muscle weakness (generalized) (M62.81)    Time: 5056-9794 PT Time Calculation (min) (ACUTE ONLY): 25 min   Charges:   PT Evaluation $PT Eval Moderate Complexity: 1 Mod PT Treatments $Therapeutic Activity: 23-37 mins        12:15 PM, 03/22/20 Lonell Grandchild, MPT Physical Therapist with [facility] [phone] office [phone] mobile phone

## 2020-03-22 NOTE — Progress Notes (Addendum)
Pt has been very disgruntled and agitated all morning over apparent care she received overnight. Any person of the care team that comes in she complains about her night to. Saying how she was "laying in urine all night, and no one offered to clean her up and now she is raw." Along with multiple other complaints. States," she will never come back to this hospital and she would rather die before coming back here." Day shift RN attempted multiple times to calm the patient down and offered emotional support. Made unit director Tawnya Crook and patient advocate Amy Tamala Julian aware so they can round on the patient as she says she is going to file a complaint on the hospital. Will continue to monitor and offer support until patient is discharged which should be sometime today.

## 2020-03-22 NOTE — Discharge Summary (Signed)
Physician Discharge Summary  Denise Freeman DPO:[id number] DOB: 30-Apr-1951 DOA: 03/20/2020  PCP: [name], NP  Admit date: 03/20/2020 Discharge date: 03/22/2020  Time spent: 35 minutes  Recommendations for Outpatient Follow-up:  1. Repeat basic metabolic panel to follow renal function and electrolytes 2. Reassess blood pressure and adjust antihypertensive treatment as needed 3. Close monitoring of patient's CBGs with further adjustment to hypoglycemic regimen as required 4. Repeat vitamin D level in 12 weeks.   Discharge Diagnoses:  Principal Problem:   Hyperglycemia Active Problems:   Unspecified atrial fibrillation (HCC)   Chronic hypoxic, hypercapnic respiratory failure   OSA (obstructive sleep apnea)   COPD with asthma (HCC)   Acute renal failure (HCC)   Chronic systolic CHF (congestive heart failure) (HCC)   Type 2 diabetes mellitus with stage 3 chronic kidney disease, with long-term current use of insulin (HCC)   Gout   Chronic kidney disease   Hyperosmolar hyperglycemic state (HHS) (HCC)   Hypokalemia   Diabetic hyperosmolar non-ketotic state ([place])   Class 1 obesity due to excess calories with serious comorbidity and body mass index (BMI) of 34.0 to 34.9 in adult   Discharge Condition: Stable and improved.  Patient discharged home with instruction to follow-up with PCP in 10 days.  It appears that while waiting for patient to be discharged despite medication sent to pharmacy already, she opted to sign AMA papers and left before final instructions by nursing staff.  CODE STATUS: Full code  Diet recommendation: modified carbohydrates, low calorie and heart healthy diet.  Filed Weights   03/20/20 1427  Weight: 77.1 kg    History of present illness:  As per H&P written by Dr. Denton Brick on 03/20/20 Denise Freeman a 69 y.o.femalewith medical history significant forDM2,atrial fibrillation, systolic and diastolic CHF, chronic respiratory failure on 3 L,  COPD, CKD 3. Patient presented to the ED with complaints of dizziness intermittent since she gotherfirst dose ofcovid vaccinelast month September(pfizer).Dizziness is present only when standing up,and she reports dizziness is present most days. Patient reports she fell today, she did not hit her head, and she did not lose consciousness. She did hit the left side of her keep against her drawer. Patient reports daily compliance with her insulins which include dose and sliding scale Humalog. She last took her medications this morning, and tells me her blood sugar this morning was 150. She also reports compliance with her torsemide. No vomiting no loose stools. She reports dry cough over the past 3 days.She denies difficulty breathing. No chest pain.  ED Course:temp 98.4,Heart rate 50s to 70s, blood pressure 108 -132, O2 sats 95% on 3 L,also documentation that patient O2 sats dropped to 87 to 88% on 3 L and increased to 92% on 4 L,also patient was was yelling that she cannot lay down as she felt like she was losing her air-patient tells me she was just uncomfortable while she was lying down flat. Blood glucose 761, potassium 3, sodium 119, creatinine elevated 2.85, WBC 11.5. Pelvic x-ray was without acute abnormality. Head CT also unremarkable. Patient was started on insulin drip, 2.5 L bolus of fluids given. Hospitalist to admit for hyperglycemia and AKI.  Hospital Course:  1-hyperosmolar nonketotic hyperglycemia -Patient blood sugar on presentation 740 -Responded well to fluid resuscitation and IV insulin therapy -hyperosmolar state corrected with IVF's, and IV insulin therapy. Patient successfully transitioned to SSI and long acting insulin. -insulin dose adjusted at discharge; will need close follow up with her PCP to further adjust  hypoglycemic regimen as required. -Modified carbohydrate diet has been encouraged. -Follow CBGs and further adjust hypoglycemic medication as  needed. -A1c 13.5  2-acute kidney injury on chronic kidney disease stage IIIb -In the setting of dehydration and continue use of nephrotoxic agents -Creatinine improved and close to baseline at discharge -Cr 1.7 -Continue holding nephrotoxic agents at discharge and asked to keep herself well hydrated. -Continue fluid resuscitation -Follow trend with repeat BMET at follow up visit.Marland Kitchen  3-hypokalemia and pseudohyponatremia -In the setting of dehydration and hyperglycemia. -Sodium level now within normal limits after correction. -Potassium is corrected and within normal limits at discharge.    4-chronic respiratory failure secondary to COPD and obstructive sleep apnea -Continue the use of BIPAP nightly -Continue oxygen supplementation chronically use at 3 L on daily basis -Currently no wheezing -Continue home inhaler/nebulizer management. -Outpatient follow-up with pulmonology service recommended.  5-gastroesophageal reflux disease -Continue PPI  6-history of atrial fibrillation (paroxysmal and chronic) -Rate controlled and currently sinus -Continue metoprolol and Coumadin for secondary prevention -outpatient follow up with cardiology service.   7-hypertension -will adjust dose and ask patient to resume diuretics and ARB after 10/23 -BP is stable overall -currently in the setting of acute kidney injury. -Continue metoprolol and low sodium/heart healthy diet.. -Follow BP at follow up visit and adjust antihypertensive regimen as needed.  .  8-physical deconditioning and unstable gait -B12 WNL -Vit D level low at 15 -Physical therapy has been called to assess patient and provide recommendations.  9-history of chronic pain -Continue home pain medication. -Outpatient follow-up for further adjustment in her medication dosage and if needed referral to pain clinic.Marland Kitchen  10-Vit D deficiency -will start repletion with weekly high dose vit D repletion.  11-class 1  obesity -Body mass index is 34.34 kg/m. -low calorie diet, portion control and increased physical activity recommended.   Procedures: See below for x-ray report.  Consultations:  None  Discharge Exam: Vitals:   03/22/20 0808 03/22/20 0900  BP: (!) 176/131   Pulse: 91   Resp: (!) 6   Temp:  97.7 F (36.5 C)  SpO2: 96%     General: Afebrile, no chest pain, no nausea, no vomiting.  Reports having a rough night with nursing interaction and care; no acute distress.  Shachar in the high 200s low 300s range and with normalization of her electrolytes.  Renal function significantly improved and close to baseline at discharge. Cardiovascular: Rate controlled, no rubs, no gallops, no JVD on exam. Respiratory: Good air movement bilaterally, no wheezing, no using accessory muscle.  3 L nasal cannula supplementation in place Abdomen: Soft, nontender, distended, positive bowel sounds Extremities: No cyanosis or clubbing.  Discharge Instructions   Discharge Instructions    Diet - low sodium heart healthy   Complete by: As directed    Discharge instructions   Complete by: As directed    Take medications as prescribed Maintain adequate hydration Please follow low sodium and modified carbohydrates diet Follow up with PCP in 10 days.   Face-to-face encounter (required for Medicare/Medicaid patients)   Complete by: As directed    I Barton Dubois certify that this patient is under my care and that I, or a nurse practitioner or physician's assistant working with me, had a face-to-face encounter that meets the physician face-to-face encounter requirements with this patient on 03/22/2020. The encounter with the patient was in whole, or in part for the following medical condition(s) which is the primary reason for home health care (List medical condition): hyperosmolar  nonketotic hyperglycemia, acute on chornic renal failure and physical deconditioning; will need HHPT at discharge.   The encounter  with the patient was in whole, or in part, for the following medical condition, which is the primary reason for home health care: hyperosmolar nonketotic hyperglycemia, acute on chornic renal failure and physical deconditioning; will need HHPT at discharge.   I certify that, based on my findings, the following services are medically necessary home health services: Physical therapy   Reason for Medically Necessary Home Health Services:  Therapy- Personnel officer, Public librarian Therapy- Instruction on use of Assistive Device for Ambulation on all Surfaces Therapy- Home Adaptation to Facilitate Safety Therapy- Therapeutic Exercises to Increase Strength and Endurance     My clinical findings support the need for the above services: Unable to leave home safely without assistance and/or assistive device   Further, I certify that my clinical findings support that this patient is homebound due to: Unable to leave home safely without assistance   Home Health   Complete by: As directed    To provide the following care/treatments: PT   Increase activity slowly   Complete by: As directed      Allergies as of 03/22/2020      Reactions   Oseltamivir Hives   Other reaction(s): Other (see comments) "water blisters"   Xarelto [rivaroxaban] Other (See Comments)   Internal bleeding   Ancef [cefazolin] Nausea And Vomiting   Levaquin [levofloxacin In D5w] Other (See Comments)   "afib"   Lyrica [pregabalin] Hives   Tamiflu [oseltamivir Phosphate] Other (See Comments)   "water blisters"   Zoloft [sertraline Hcl] Other (See Comments)   Jaw problems, jittery   Torsemide Other (See Comments)   Not effective   Augmentin [amoxicillin-pot Clavulanate] Itching   Ciprofloxacin Itching, Nausea And Vomiting   Haldol [haloperidol] Other (See Comments)   Restless leg   Nsaids Diarrhea   Penicillins Itching, Nausea And Vomiting, Rash   Has patient had a PCN reaction causing immediate rash,  facial/tongue/throat swelling, SOB or lightheadedness with hypotension: Yes Has patient had a PCN reaction causing severe rash involving mucus membranes or skin necrosis: No Has patient had a PCN reaction that required hospitalization No Has patient had a PCN reaction occurring within the last 10 years: Yes If all of the above answers are "NO", then may proceed with Cephalosporin use.   Topamax [topiramate] Nausea Only      Medication List    STOP taking these medications   mometasone 50 MCG/ACT nasal spray Commonly known as: NASONEX   mupirocin cream 2 % Commonly known as: Bactroban   nystatin powder Commonly known as: nystatin   ondansetron 4 MG tablet Commonly known as: ZOFRAN   triamcinolone lotion 0.1 % Commonly known as: KENALOG     TAKE these medications   albuterol (2.5 MG/3ML) 0.083% nebulizer solution Commonly known as: PROVENTIL Take 3 mLs (2.5 mg total) by nebulization every 6 (six) hours as needed for wheezing or shortness of breath.   allopurinol 100 MG tablet Commonly known as: ZYLOPRIM TAKE 1 TABLET BY MOUTH ONCE DAILY.   Basaglar KwikPen 100 UNIT/ML Inject 50 Units into the skin 2 (two) times daily. What changed:   how much to take  when to take this   budesonide 0.5 MG/2ML nebulizer solution Commonly known as: PULMICORT Take 2 mLs (0.5 mg total) by nebulization 2 (two) times daily.   cetirizine 10 MG tablet Commonly known as: ZYRTEC Take 1 tablet (10 mg  total) by mouth at bedtime.   cholecalciferol 10 MCG (400 UNIT) Tabs tablet Commonly known as: VITAMIN D3 Take 1 tablet (400 Units total) by mouth 2 (two) times daily.   clobetasol cream 0.05 % Commonly known as: TEMOVATE Apply 1 application topically daily.   colchicine 0.6 MG tablet Take 1 tablet (0.6 mg total) by mouth as needed. What changed: reasons to take this   escitalopram 10 MG tablet Commonly known as: LEXAPRO Take 1 tablet (10 mg total) by mouth at bedtime.   FeroSul 325  (65 FE) MG tablet Generic drug: ferrous sulfate Take 325 mg by mouth daily.   fluticasone 50 MCG/ACT nasal spray Commonly known as: FLONASE Place 1 spray into both nostrils 2 (two) times daily.   furosemide 40 MG tablet Commonly known as: LASIX Take 1 tablet (40 mg total) by mouth daily. What changed: See the new instructions.   insulin lispro 100 UNIT/ML KiwkPen Commonly known as: HUMALOG Inject 0.16-0.18 mLs (16-18 Units total) into the skin 3 (three) times daily before meals.   Insulin Pen Needle 32G X 4 MM Misc Use to inject insulin 5 times daily   ipratropium 0.02 % nebulizer solution Commonly known as: ATROVENT Take 2.5 mLs (0.5 mg total) by nebulization every 6 (six) hours as needed for wheezing or shortness of breath.   losartan 25 MG tablet Commonly known as: COZAAR Take 0.5 tablets (12.5 mg total) by mouth daily. Start taking on: March 25, 2020 What changed:   See the new instructions.  These instructions start on March 25, 2020. If you are unsure what to do until then, ask your doctor or other care provider.   metolazone 5 MG tablet Commonly known as: ZAROXOLYN Take 1 tablet (5 mg total) by mouth 3 (three) times a week. Start taking on: March 24, 2020 What changed: when to take this   metoprolol succinate 25 MG 24 hr tablet Commonly known as: TOPROL-XL TAKE 1 TABLET BY MOUTH TWICE DAILY What changed: when to take this   mirtazapine 15 MG tablet Commonly known as: REMERON Take 1 tablet (15 mg total) by mouth at bedtime.   modafinil 200 MG tablet Commonly known as: PROVIGIL Take 1 tablet (200 mg total) by mouth daily.   montelukast 10 MG tablet Commonly known as: SINGULAIR Take 1 tablet (10 mg total) by mouth at bedtime.   nitroGLYCERIN 0.4 MG SL tablet Commonly known as: NITROSTAT Place 0.4 mg under the tongue every 5 (five) minutes as needed for chest pain.   oxyCODONE-acetaminophen 5-325 MG tablet Commonly known as:  PERCOCET/ROXICET Take 1 tablet by mouth 2 (two) times daily as needed for severe pain.   pantoprazole 20 MG tablet Commonly known as: PROTONIX TAKE (1) TABLET BY MOUTH TWICE A DAY BEFORE MEALS. (BREAKFAST AND SUPPER) What changed: See the new instructions.   potassium chloride SA 20 MEQ tablet Commonly known as: KLOR-CON TAKE 1 TABLET BY MOUTH TWICE DAILY   primidone 50 MG tablet Commonly known as: MYSOLINE TAKE ONE TABLET BY MOUTH AT BEDTIME.   PROBIOTIC PO Take 1 tablet by mouth at bedtime.   TART CHERRY ADVANCED PO Take by mouth daily.   True Metrix Blood Glucose Test test strip Generic drug: glucose blood USE TO TEST BLOOD SUGAR AS DIRECTED.   Vitamin A 2400 MCG (8000 UT) Caps Take 8,000 Units by mouth every morning.   Vitamin D (Ergocalciferol) 1.25 MG (50000 UNIT) Caps capsule Commonly known as: DRISDOL Take 1 capsule (50,000 Units total) by mouth  every 7 (seven) days.   warfarin 4 MG tablet Commonly known as: Coumadin Take as directed. If you are unsure how to take this medication, talk to your nurse or doctor. Original instructions: Take 2 tablets tonight, 1 1/2 tablets tomorrow night then increase dose to 1 tablet daily What changed:   how much to take  how to take this  when to take this  additional instructions      Allergies  Allergen Reactions  . Oseltamivir Hives    Other reaction(s): Other (see comments) "water blisters"  . Xarelto [Rivaroxaban] Other (See Comments)    Internal bleeding  . Ancef [Cefazolin] Nausea And Vomiting  . Levaquin [Levofloxacin In D5w] Other (See Comments)    "afib"  . Lyrica [Pregabalin] Hives  . Tamiflu [Oseltamivir Phosphate] Other (See Comments)    "water blisters"  . Zoloft [Sertraline Hcl] Other (See Comments)    Jaw problems, jittery  . Torsemide Other (See Comments)    Not effective  . Augmentin [Amoxicillin-Pot Clavulanate] Itching  . Ciprofloxacin Itching and Nausea And Vomiting  . Haldol  [Haloperidol] Other (See Comments)    Restless leg  . Nsaids Diarrhea  . Penicillins Itching, Nausea And Vomiting and Rash    Has patient had a PCN reaction causing immediate rash, facial/tongue/throat swelling, SOB or lightheadedness with hypotension: Yes Has patient had a PCN reaction causing severe rash involving mucus membranes or skin necrosis: No Has patient had a PCN reaction that required hospitalization No Has patient had a PCN reaction occurring within the last 10 years: Yes If all of the above answers are "NO", then may proceed with Cephalosporin use.  . Topamax [Topiramate] Nausea Only    Follow-up Information    [name], NP. Schedule an appointment as soon as possible for a visit in 10 day(s).   Specialty: Adult Health Nurse Practitioner Contact information: [place] [address] [phone]        Pixie Casino, MD .   Specialty: Cardiology Contact information: [address] [place] [address] [phone]               The results of significant diagnostics from this hospitalization (including imaging, microbiology, ancillary and laboratory) are listed below for reference.    Significant Diagnostic Studies: CT Head Wo Contrast  Result Date: 03/20/2020 CLINICAL DATA:  Dizzy spells since COVID vaccine EXAM: CT HEAD WITHOUT CONTRAST TECHNIQUE: Contiguous axial images were obtained from the base of the skull through the vertex without intravenous contrast. COMPARISON:  CT brain 12/11/2015 FINDINGS: Brain: No acute territorial infarction, hemorrhage or intracranial mass. The ventricles are nonenlarged. Vascular: No hyperdense vessel or unexpected calcification. Skull: Normal. Negative for fracture or focal lesion. Sinuses/Orbits: No acute finding.  Frothy debris in the nasopharynx. Other: None IMPRESSION: Negative non contrasted CT appearance of the brain. Electronically Signed   By: Donavan Foil M.D.   On:  03/20/2020 16:37   DG CHEST PORT 1 VIEW  Result Date: 03/20/2020 CLINICAL DATA:  Fall, cough EXAM: PORTABLE CHEST 1 VIEW COMPARISON:  04/27/2019 FINDINGS: Surgical hardware in the cervical spine. Hyperinflated lungs. Cardiac enlargement without overt edema or pleural effusion. No focal consolidation. IMPRESSION: No active disease. Mild cardiomegaly. Electronically Signed   By: Donavan Foil M.D.   On: 03/20/2020 20:04   DG Hip Unilat W or Wo Pelvis 2-3 Views Left  Result Date: 03/20/2020 CLINICAL DATA:  Fall EXAM: DG HIP (WITH OR WITHOUT PELVIS) 2-3V LEFT COMPARISON:  None.  FINDINGS: There is no acute fracture or dislocation. Mild changes of osteoarthritis. Bladder stimulator is present. IMPRESSION: No acute fracture. Electronically Signed   By: Macy Mis M.D.   On: 03/20/2020 16:28    Microbiology: Recent Results (from the past 240 hour(s))  Resp Panel by RT PCR (RSV, Flu A&B, Covid) - Nasopharyngeal Swab     Status: None   Collection Time: 03/20/20  5:05 PM   Specimen: Nasopharyngeal Swab  Result Value Ref Range Status   SARS Coronavirus 2 by RT PCR NEGATIVE NEGATIVE Final    Comment: (NOTE) SARS-CoV-2 target nucleic acids are NOT DETECTED.  The SARS-CoV-2 RNA is generally detectable in upper respiratoy specimens during the acute phase of infection. The lowest concentration of SARS-CoV-2 viral copies this assay can detect is 131 copies/mL. A negative result does not preclude SARS-Cov-2 infection and should not be used as the sole basis for treatment or other patient management decisions. A negative result may occur with  improper specimen collection/handling, submission of specimen other than nasopharyngeal swab, presence of viral mutation(s) within the areas targeted by this assay, and inadequate number of viral copies (<131 copies/mL). A negative result must be combined with clinical observations, patient history, and epidemiological information. The expected result is  Negative.  Fact Sheet for Patients:  PinkCheek.be  Fact Sheet for Healthcare Providers:  GravelBags.it  This test is no t yet approved or cleared by the [place] FDA and  has been authorized for detection and/or diagnosis of SARS-CoV-2 by FDA under an Emergency Use Authorization (EUA). This EUA will remain  in effect (meaning this test can be used) for the duration of the COVID-19 declaration under Section 564(b)(1) of the Act, 21 U.S.C. section 360bbb-3(b)(1), unless the authorization is terminated or revoked sooner.     Influenza A by PCR NEGATIVE NEGATIVE Final   Influenza B by PCR NEGATIVE NEGATIVE Final    Comment: (NOTE) The Xpert Xpress SARS-CoV-2/FLU/RSV assay is intended as an aid in  the diagnosis of influenza from Nasopharyngeal swab specimens and  should not be used as a sole basis for treatment. Nasal washings and  aspirates are unacceptable for Xpert Xpress SARS-CoV-2/FLU/RSV  testing.  Fact Sheet for Patients: PinkCheek.be  Fact Sheet for Healthcare Providers: GravelBags.it  This test is not yet approved or cleared by the [place] FDA and  has been authorized for detection and/or diagnosis of SARS-CoV-2 by  FDA under an Emergency Use Authorization (EUA). This EUA will remain  in effect (meaning this test can be used) for the duration of the  Covid-19 declaration under Section 564(b)(1) of the Act, 21  U.S.C. section 360bbb-3(b)(1), unless the authorization is  terminated or revoked.    Respiratory Syncytial Virus by PCR NEGATIVE NEGATIVE Final    Comment: (NOTE) Fact Sheet for Patients: PinkCheek.be  Fact Sheet for Healthcare Providers: GravelBags.it  This test is not yet approved or cleared by the [place] FDA and  has been authorized for detection and/or diagnosis of  SARS-CoV-2 by  FDA under an Emergency Use Authorization (EUA). This EUA will remain  in effect (meaning this test can be used) for the duration of the  COVID-19 declaration under Section 564(b)(1) of the Act, 21 U.S.C.  section 360bbb-3(b)(1), unless the authorization is terminated or  revoked. Performed at [facility], [address] [place], [place] [address]   MRSA PCR Screening     Status: None   Collection Time: 03/20/20 10:13 PM   Specimen: Nasal Mucosa; Nasopharyngeal  Result Value Ref Range Status   MRSA by PCR NEGATIVE NEGATIVE Final    Comment:        The GeneXpert MRSA Assay (FDA approved for NASAL specimens only), is one component of a comprehensive MRSA colonization surveillance program. It is not intended to diagnose MRSA infection nor to guide or monitor treatment for MRSA infections. Performed at [facility], [address], [place], [place] [address]      Labs: Basic Metabolic Panel: Recent Labs  Lab 03/20/20 1604 03/20/20 1842 03/21/20 0204 03/21/20 0703 03/22/20 0307  NA 119* 121* 130* 131* 134*  K 3.0* 3.3* 3.1* 2.8* 4.2  CL 64* 67* 80* 83* 85*  CO2 36* 32 36* 34* 34*  GLUCOSE 761* 739* 137* 259* 335*  BUN 136* 136* 123* 116* 99*  CREATININE 2.85* 2.73* 2.02* 2.03* 1.71*  CALCIUM 8.5* 8.5* 8.5* 8.2* 8.7*  MG  --  2.1  --   --  1.6*   CBC: Recent Labs  Lab 03/20/20 1604  WBC 11.5*  NEUTROABS 9.7*  HGB 12.5  HCT 36.2  MCV 83.6  PLT 230   BNP (last 3 results) Recent Labs    04/27/19 1716  BNP 318.0*   CBG: Recent Labs  Lab 03/21/20 1120 03/21/20 1609 03/21/20 2104 03/22/20 0610 03/22/20 0747  GLUCAP 237* 176* 232* 280* 262*   Signed:  Barton Dubois MD.  [facility] 03/22/2020, 12:12 PM

## 2020-03-22 NOTE — Progress Notes (Signed)
Pt left AMA. Appropriate paperwork signed. Pt showed where elevator was and how to get to main entrance. MD made aware.

## 2020-03-22 NOTE — Progress Notes (Signed)
Pt advocate Amy Tamala Julian at bedside speaking with patient and spouse.

## 2020-03-22 NOTE — Progress Notes (Signed)
Pt refusing lunch time blood glucose check.

## 2020-03-23 ENCOUNTER — Other Ambulatory Visit: Payer: Self-pay

## 2020-03-23 ENCOUNTER — Encounter (HOSPITAL_COMMUNITY): Payer: Self-pay

## 2020-03-23 ENCOUNTER — Inpatient Hospital Stay (HOSPITAL_COMMUNITY)
Admission: EM | Admit: 2020-03-23 | Discharge: 2020-03-27 | DRG: 292 | Disposition: A | Discharge status: Home Health | Payer: Medicare Other | Attending: Family Medicine | Admitting: Family Medicine

## 2020-03-23 ENCOUNTER — Emergency Department (HOSPITAL_COMMUNITY): Payer: Medicare Other

## 2020-03-23 DIAGNOSIS — N179 Acute kidney failure, unspecified: Secondary | ICD-10-CM | POA: Diagnosis present

## 2020-03-23 DIAGNOSIS — I5043 Acute on chronic combined systolic (congestive) and diastolic (congestive) heart failure: Secondary | ICD-10-CM | POA: Diagnosis present

## 2020-03-23 DIAGNOSIS — I4891 Unspecified atrial fibrillation: Secondary | ICD-10-CM

## 2020-03-23 DIAGNOSIS — M109 Gout, unspecified: Secondary | ICD-10-CM | POA: Diagnosis present

## 2020-03-23 DIAGNOSIS — I509 Heart failure, unspecified: Secondary | ICD-10-CM

## 2020-03-23 DIAGNOSIS — Z794 Long term (current) use of insulin: Secondary | ICD-10-CM | POA: Diagnosis not present

## 2020-03-23 DIAGNOSIS — E1165 Type 2 diabetes mellitus with hyperglycemia: Secondary | ICD-10-CM | POA: Diagnosis present

## 2020-03-23 DIAGNOSIS — Z7951 Long term (current) use of inhaled steroids: Secondary | ICD-10-CM | POA: Diagnosis not present

## 2020-03-23 DIAGNOSIS — E1122 Type 2 diabetes mellitus with diabetic chronic kidney disease: Secondary | ICD-10-CM | POA: Diagnosis present

## 2020-03-23 DIAGNOSIS — N183 Chronic kidney disease, stage 3 unspecified: Secondary | ICD-10-CM | POA: Diagnosis present

## 2020-03-23 DIAGNOSIS — I959 Hypotension, unspecified: Secondary | ICD-10-CM | POA: Diagnosis not present

## 2020-03-23 DIAGNOSIS — M797 Fibromyalgia: Secondary | ICD-10-CM | POA: Diagnosis present

## 2020-03-23 DIAGNOSIS — E1140 Type 2 diabetes mellitus with diabetic neuropathy, unspecified: Secondary | ICD-10-CM | POA: Diagnosis present

## 2020-03-23 DIAGNOSIS — Z23 Encounter for immunization: Secondary | ICD-10-CM | POA: Diagnosis not present

## 2020-03-23 DIAGNOSIS — E669 Obesity, unspecified: Secondary | ICD-10-CM | POA: Diagnosis present

## 2020-03-23 DIAGNOSIS — R000 Tachycardia, unspecified: Secondary | ICD-10-CM | POA: Diagnosis not present

## 2020-03-23 DIAGNOSIS — E11649 Type 2 diabetes mellitus with hypoglycemia without coma: Secondary | ICD-10-CM | POA: Diagnosis present

## 2020-03-23 DIAGNOSIS — I4821 Permanent atrial fibrillation: Secondary | ICD-10-CM | POA: Diagnosis present

## 2020-03-23 DIAGNOSIS — R778 Other specified abnormalities of plasma proteins: Secondary | ICD-10-CM

## 2020-03-23 DIAGNOSIS — K219 Gastro-esophageal reflux disease without esophagitis: Secondary | ICD-10-CM | POA: Diagnosis present

## 2020-03-23 DIAGNOSIS — R0689 Other abnormalities of breathing: Secondary | ICD-10-CM | POA: Diagnosis not present

## 2020-03-23 DIAGNOSIS — J449 Chronic obstructive pulmonary disease, unspecified: Secondary | ICD-10-CM | POA: Diagnosis present

## 2020-03-23 DIAGNOSIS — J9811 Atelectasis: Secondary | ICD-10-CM | POA: Diagnosis not present

## 2020-03-23 DIAGNOSIS — Z87891 Personal history of nicotine dependence: Secondary | ICD-10-CM

## 2020-03-23 DIAGNOSIS — R069 Unspecified abnormalities of breathing: Secondary | ICD-10-CM | POA: Diagnosis not present

## 2020-03-23 DIAGNOSIS — Z7901 Long term (current) use of anticoagulants: Secondary | ICD-10-CM

## 2020-03-23 DIAGNOSIS — I428 Other cardiomyopathies: Secondary | ICD-10-CM | POA: Diagnosis present

## 2020-03-23 DIAGNOSIS — Z20822 Contact with and (suspected) exposure to covid-19: Secondary | ICD-10-CM | POA: Diagnosis present

## 2020-03-23 DIAGNOSIS — Z9981 Dependence on supplemental oxygen: Secondary | ICD-10-CM | POA: Diagnosis not present

## 2020-03-23 DIAGNOSIS — R7989 Other specified abnormal findings of blood chemistry: Secondary | ICD-10-CM | POA: Diagnosis not present

## 2020-03-23 DIAGNOSIS — I517 Cardiomegaly: Secondary | ICD-10-CM | POA: Diagnosis not present

## 2020-03-23 DIAGNOSIS — J9611 Chronic respiratory failure with hypoxia: Secondary | ICD-10-CM | POA: Diagnosis present

## 2020-03-23 DIAGNOSIS — Z79899 Other long term (current) drug therapy: Secondary | ICD-10-CM | POA: Diagnosis not present

## 2020-03-23 DIAGNOSIS — I5033 Acute on chronic diastolic (congestive) heart failure: Secondary | ICD-10-CM | POA: Diagnosis not present

## 2020-03-23 DIAGNOSIS — G4733 Obstructive sleep apnea (adult) (pediatric): Secondary | ICD-10-CM | POA: Diagnosis present

## 2020-03-23 DIAGNOSIS — R0602 Shortness of breath: Secondary | ICD-10-CM | POA: Diagnosis not present

## 2020-03-23 LAB — CBC
HCT: 36.4 % (ref 36.0–46.0)
Hemoglobin: 11.6 g/dL — ABNORMAL LOW (ref 12.0–15.0)
MCH: 28.8 pg (ref 26.0–34.0)
MCHC: 31.9 g/dL (ref 30.0–36.0)
MCV: 90.3 fL (ref 80.0–100.0)
Platelets: 222 10*3/uL (ref 150–400)
RBC: 4.03 MIL/uL (ref 3.87–5.11)
RDW: 12.4 % (ref 11.5–15.5)
WBC: 11.5 10*3/uL — ABNORMAL HIGH (ref 4.0–10.5)
nRBC: 0 % (ref 0.0–0.2)

## 2020-03-23 LAB — BASIC METABOLIC PANEL
Anion gap: 14 (ref 5–15)
BUN: 69 mg/dL — ABNORMAL HIGH (ref 8–23)
CO2: 33 mmol/L — ABNORMAL HIGH (ref 22–32)
Calcium: 9.3 mg/dL (ref 8.9–10.3)
Chloride: 92 mmol/L — ABNORMAL LOW (ref 98–111)
Creatinine, Ser: 1.7 mg/dL — ABNORMAL HIGH (ref 0.44–1.00)
GFR, Estimated: 32 mL/min — ABNORMAL LOW (ref >60)
Glucose, Bld: 331 mg/dL — ABNORMAL HIGH (ref 70–99)
Potassium: 3.9 mmol/L (ref 3.5–5.1)
Sodium: 139 mmol/L (ref 135–145)

## 2020-03-23 LAB — RESPIRATORY PANEL BY RT PCR (FLU A&B, COVID)
Influenza A by PCR: NEGATIVE
Influenza B by PCR: NEGATIVE
SARS Coronavirus 2 by RT PCR: NEGATIVE

## 2020-03-23 LAB — TROPONIN I (HIGH SENSITIVITY)
Troponin I (High Sensitivity): 159 ng/L (ref <18)
Troponin I (High Sensitivity): 189 ng/L (ref <18)

## 2020-03-23 LAB — GLUCOSE, CAPILLARY
Glucose-Capillary: 276 mg/dL — ABNORMAL HIGH (ref 70–99)
Glucose-Capillary: 253 mg/dL — ABNORMAL HIGH (ref 70–99)

## 2020-03-23 LAB — PROTIME-INR
INR: 1.8 — ABNORMAL HIGH (ref 0.8–1.2)
Prothrombin Time: 20 seconds — ABNORMAL HIGH (ref 11.4–15.2)

## 2020-03-23 LAB — BRAIN NATRIURETIC PEPTIDE: B Natriuretic Peptide: 925 pg/mL — ABNORMAL HIGH (ref 0.0–100.0)

## 2020-03-23 MED ORDER — PANTOPRAZOLE SODIUM 40 MG PO TBEC
40.0000 mg | DELAYED_RELEASE_TABLET | Freq: Every day | ORAL | Status: DC
  Administered 2020-03-23 – 2020-03-27 (×5): 40 mg via ORAL
  Filled 2020-03-23 (×5): qty 1

## 2020-03-23 MED ORDER — MODAFINIL 200 MG PO TABS
200.0000 mg | ORAL_TABLET | Freq: Every day | ORAL | Status: DC
Start: 2020-03-24 — End: 2020-03-27
  Administered 2020-03-24 – 2020-03-27 (×4): 200 mg via ORAL
  Filled 2020-03-23 (×4): qty 1

## 2020-03-23 MED ORDER — PANTOPRAZOLE SODIUM 20 MG PO TBEC: 20.0000 mg | DELAYED_RELEASE_TABLET | Freq: Two times a day (BID) | ORAL | Status: DC

## 2020-03-23 MED ORDER — WARFARIN SODIUM 2 MG PO TABS
ORAL_TABLET | ORAL | Status: AC
  Filled 2020-03-23: qty 3

## 2020-03-23 MED ORDER — MIRTAZAPINE 15 MG PO TABS
15.0000 mg | ORAL_TABLET | Freq: Every day | ORAL | Status: DC
  Administered 2020-03-23 – 2020-03-27 (×4): 15 mg via ORAL
  Filled 2020-03-23 (×4): qty 1

## 2020-03-23 MED ORDER — MAGNESIUM OXIDE 400 (241.3 MG) MG PO TABS
400.0000 mg | ORAL_TABLET | Freq: Once | ORAL | Status: AC
  Administered 2020-03-23: 400 mg via ORAL
  Filled 2020-03-23: qty 1

## 2020-03-23 MED ORDER — ALBUTEROL SULFATE (2.5 MG/3ML) 0.083% IN NEBU: 2.5000 mg | INHALATION_SOLUTION | Freq: Four times a day (QID) | RESPIRATORY_TRACT | Status: DC | PRN

## 2020-03-23 MED ORDER — METOPROLOL SUCCINATE ER 25 MG PO TB24
25.0000 mg | ORAL_TABLET | Freq: Two times a day (BID) | ORAL | Status: DC
  Administered 2020-03-23 – 2020-03-27 (×9): 25 mg via ORAL
  Filled 2020-03-23 (×9): qty 1

## 2020-03-23 MED ORDER — WARFARIN SODIUM 6 MG PO TABS
6.0000 mg | ORAL_TABLET | Freq: Once | ORAL | Status: DC
  Filled 2020-03-23: qty 1

## 2020-03-23 MED ORDER — SODIUM CHLORIDE 0.9% FLUSH
3.0000 mL | INTRAVENOUS | Status: DC | PRN
  Administered 2020-03-23: 3 mL via INTRAVENOUS

## 2020-03-23 MED ORDER — FERROUS SULFATE 325 (65 FE) MG PO TABS
325.0000 mg | ORAL_TABLET | Freq: Every day | ORAL | Status: DC
  Administered 2020-03-24 – 2020-03-27 (×4): 325 mg via ORAL
  Filled 2020-03-23 (×4): qty 1

## 2020-03-23 MED ORDER — PRIMIDONE 50 MG PO TABS
50.0000 mg | ORAL_TABLET | Freq: Every day | ORAL | Status: DC
  Administered 2020-03-23 – 2020-03-27 (×4): 50 mg via ORAL
  Filled 2020-03-23 (×4): qty 1

## 2020-03-23 MED ORDER — WARFARIN SODIUM 5 MG PO TABS
6.0000 mg | ORAL_TABLET | Freq: Once | ORAL | Status: AC
  Administered 2020-03-23: 6 mg via ORAL
  Filled 2020-03-23: qty 1

## 2020-03-23 MED ORDER — ESCITALOPRAM OXALATE 10 MG PO TABS
10.0000 mg | ORAL_TABLET | Freq: Every day | ORAL | Status: DC
  Administered 2020-03-23 – 2020-03-27 (×4): 10 mg via ORAL
  Filled 2020-03-23 (×4): qty 1

## 2020-03-23 MED ORDER — ASPIRIN 81 MG PO CHEW
324.0000 mg | CHEWABLE_TABLET | Freq: Once | ORAL | Status: AC
  Administered 2020-03-23: 324 mg via ORAL
  Filled 2020-03-23: qty 4

## 2020-03-23 MED ORDER — NITROGLYCERIN 2 % TD OINT
1.0000 [in_us] | TOPICAL_OINTMENT | Freq: Four times a day (QID) | TRANSDERMAL | Status: DC
  Administered 2020-03-23 – 2020-03-24 (×5): 1 [in_us] via TOPICAL
  Filled 2020-03-23 (×5): qty 1

## 2020-03-23 MED ORDER — DILTIAZEM HCL 25 MG/5ML IV SOLN
10.0000 mg | Freq: Once | INTRAVENOUS | Status: AC
  Administered 2020-03-23: 10 mg via INTRAVENOUS
  Filled 2020-03-23: qty 5

## 2020-03-23 MED ORDER — SODIUM CHLORIDE 0.9% FLUSH
3.0000 mL | Freq: Two times a day (BID) | INTRAVENOUS | Status: DC
  Administered 2020-03-24 – 2020-03-27 (×5): 3 mL via INTRAVENOUS

## 2020-03-23 MED ORDER — WARFARIN - PHARMACIST DOSING INPATIENT: Freq: Every day | Status: DC

## 2020-03-23 MED ORDER — ALLOPURINOL 100 MG PO TABS
100.0000 mg | ORAL_TABLET | Freq: Every day | ORAL | Status: DC
  Administered 2020-03-23 – 2020-03-27 (×5): 100 mg via ORAL
  Filled 2020-03-23 (×5): qty 1

## 2020-03-23 MED ORDER — INSULIN ASPART 100 UNIT/ML ~~LOC~~ SOLN
16.0000 [IU] | Freq: Three times a day (TID) | SUBCUTANEOUS | Status: DC
  Administered 2020-03-23 – 2020-03-24 (×3): 16 [IU] via SUBCUTANEOUS

## 2020-03-23 MED ORDER — COLCHICINE 0.6 MG PO TABS: 0.6000 mg | ORAL_TABLET | Freq: Every day | ORAL | Status: DC | PRN

## 2020-03-23 MED ORDER — FUROSEMIDE 10 MG/ML IJ SOLN
20.0000 mg | Freq: Once | INTRAMUSCULAR | Status: AC
  Administered 2020-03-23: 20 mg via INTRAVENOUS
  Filled 2020-03-23: qty 2

## 2020-03-23 MED ORDER — MONTELUKAST SODIUM 10 MG PO TABS
10.0000 mg | ORAL_TABLET | Freq: Every day | ORAL | Status: DC
  Administered 2020-03-23 – 2020-03-27 (×4): 10 mg via ORAL
  Filled 2020-03-23 (×4): qty 1

## 2020-03-23 MED ORDER — INSULIN LISPRO 100 UNIT/ML (KWIKPEN)
16.0000 [IU] | PEN_INJECTOR | Freq: Three times a day (TID) | SUBCUTANEOUS | Status: DC
Start: 2020-03-23 — End: 2020-03-23

## 2020-03-23 MED ORDER — INFLUENZA VAC A&B SA ADJ QUAD 0.5 ML IM PRSY
0.5000 mL | PREFILLED_SYRINGE | INTRAMUSCULAR | Status: DC
  Filled 2020-03-23 (×2): qty 0.5

## 2020-03-23 MED ORDER — INSULIN LISPRO 100 UNIT/ML (KWIKPEN): 16.0000 [IU] | PEN_INJECTOR | Freq: Three times a day (TID) | SUBCUTANEOUS | Status: DC

## 2020-03-23 MED ORDER — INSULIN ASPART 100 UNIT/ML ~~LOC~~ SOLN
6.0000 [IU] | Freq: Once | SUBCUTANEOUS | Status: AC
  Administered 2020-03-23: 6 [IU] via SUBCUTANEOUS
  Filled 2020-03-23: qty 1

## 2020-03-23 MED ORDER — SODIUM CHLORIDE 0.9 % IV SOLN: 250.0000 mL | INTRAVENOUS | Status: DC | PRN

## 2020-03-23 MED ORDER — INSULIN GLARGINE 100 UNIT/ML ~~LOC~~ SOLN
50.0000 [IU] | Freq: Two times a day (BID) | SUBCUTANEOUS | Status: DC
  Administered 2020-03-23 – 2020-03-24 (×2): 50 [IU] via SUBCUTANEOUS
  Filled 2020-03-23 (×8): qty 0.5

## 2020-03-23 MED ORDER — BUDESONIDE 0.5 MG/2ML IN SUSP
0.5000 mg | Freq: Two times a day (BID) | RESPIRATORY_TRACT | Status: DC
  Administered 2020-03-23 – 2020-03-27 (×8): 0.5 mg via RESPIRATORY_TRACT
  Filled 2020-03-23 (×8): qty 2

## 2020-03-23 MED ORDER — OXYCODONE-ACETAMINOPHEN 5-325 MG PO TABS
1.0000 | ORAL_TABLET | Freq: Two times a day (BID) | ORAL | Status: DC | PRN
  Administered 2020-03-23 – 2020-03-27 (×6): 1 via ORAL
  Filled 2020-03-23 (×6): qty 1

## 2020-03-23 MED ORDER — WARFARIN SODIUM 4 MG PO TABS: 4.0000 mg | ORAL_TABLET | Freq: Every day | ORAL | Status: DC

## 2020-03-23 MED ORDER — IPRATROPIUM BROMIDE 0.02 % IN SOLN: 0.5000 mg | Freq: Four times a day (QID) | RESPIRATORY_TRACT | Status: DC | PRN

## 2020-03-23 MED ORDER — ACETAMINOPHEN 325 MG PO TABS
650.0000 mg | ORAL_TABLET | ORAL | Status: DC | PRN
  Administered 2020-03-23: 650 mg via ORAL
  Filled 2020-03-23: qty 2

## 2020-03-23 MED ORDER — COLCHICINE 0.6 MG PO TABS: 0.6000 mg | ORAL_TABLET | Freq: Every day | ORAL | Status: DC

## 2020-03-23 NOTE — TOC Initial Note (Signed)
Transition of Care [facility]) - Initial/Assessment Note    Patient Details  Name: Denise Freeman MRN: [id number] Date of Birth: 11-25-1950  Transition of Care [facility]) CM/SW Contact:    Iona Beard, [place] Phone Number: 03/23/2020, 2:09 PM  Clinical Narrative:                 Pt admitted for acute decompensated heart failure. Pt lives with her husband and brother. Pt states that he husband helps her complete all ADL's. 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 Pts husband provides her transportation. Pt states she uses a walker. Pt is on 3L home O2 through [employer] in [place]. Pt states that she needs a heavy duty BSC. CSW spoke with Johnathan at [facility] to make referral for [facility]. CSW was informed that pt does not meet weight requirement for bariatric BSC. Also, pt has gotten BSC from [place] previously and has not been 5+ years, meaning insurance would not cover a new one. Pt does not have [place] services but states that she would be interested if needed at D/C. TOC will follow to make referral and will need [place] orders.   CSW completed CHF screen with pt. Pt states that her husband puts her on a scale each day. Pt states that she does not always follow a heart healthy diet. Pts cardiologist is [name]. Pt states that she takes all of her medications as prescribed. TOC to follow.   Expected Discharge Plan: [place] Barriers to Discharge: Continued Medical Work up   Patient Goals and CMS Choice Patient states their goals for this hospitalization and ongoing recovery are:: Return home with [facility]   Choice offered to / list presented to : NA  Expected Discharge Plan and Services Expected Discharge Plan: [place] In-house Referral: Clinical Social Work Discharge Planning Services: NA Post Acute Care Choice: [place] arrangements for the past 2 months: Single Family Home                      Prior Living Arrangements/Services Living arrangements for  the past 2 months: Single Family Home Lives with:: Spouse, Siblings Patient language and need for interpreter reviewed:: Yes Do you feel safe going back to the place where you live?: Yes          Current home services: DME (O2 with [employer]) Criminal Activity/Legal Involvement Pertinent to Current Situation/Hospitalization: No - Comment as needed  Activities of Daily Living      Permission Sought/Granted                  Emotional Assessment Appearance:: Appears stated age Attitude/Demeanor/Rapport: Engaged Affect (typically observed): Accepting Orientation: : Oriented to Self, Oriented to Place, Oriented to  Time, Oriented to Situation Alcohol / Substance Use: Not Applicable Psych Involvement: No (comment)  Admission diagnosis:  Acute decompensated heart failure ([place]) [I50.9] Patient Active Problem List   Diagnosis Date Noted  . Acute decompensated heart failure ([place]) 03/23/2020  . Hyperosmolar hyperglycemic state (HHS) ([place])   . Hypokalemia   . Diabetic hyperosmolar non-ketotic state ([place])   . Class 1 obesity due to excess calories with serious comorbidity and body mass index (BMI) of 34.0 to 34.9 in adult   . Hyperglycemia 03/20/2020  . 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/2020  . Vitreomacular adhesion of right eye 03/15/2020  . 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 Vision loss, central 03/15/2020  .  Pseudophakia, both eyes 03/15/2020  . Gastroesophageal reflux disease   . Prolonged QT interval 04/27/2019  . CHF exacerbation ([place]) 01/24/2019  . Acute exacerbation of CHF (congestive heart failure) ([place]) 01/23/2019  . Gout 11/16/2017  . Chronic kidney disease 11/16/2017  . Uncontrolled type 2 diabetes mellitus with hyperglycemia ([place]) 11/12/2017  . Pain of left lower extremity 10/24/2017  . Medically complex patient 10/03/2017  . Chronic pain syndrome 10/03/2017  . Unstable gait 10/03/2017  . Diarrhea 07/29/2017  . Type 2  diabetes mellitus with stage 3 chronic kidney disease, with long-term current use of insulin ([place]) 04/01/2017  . Essential hypertension, benign 04/01/2017  . Mood disorder in conditions classified elsewhere 03/31/2017  . Chronic diastolic congestive heart failure ([place]) 03/12/2017  . Chronic systolic CHF (congestive heart failure) ([place]) 11/05/2016  . Pressure injury of skin 09/09/2016  . NICM (nonischemic cardiomyopathy) ([place])   . Obesity, Class III, BMI 40-49.9 (morbid obesity) ([place])   . Acute systolic congestive heart failure ([place])   . S/P laparoscopic cholecystectomy   . Heart failure ([place]) 09/06/2016  . Dyslipidemia 08/07/2016  . Vision loss 04/19/2016  . Hx of colonic polyps 03/21/2016  . Blood in stool   . Benign neoplasm of cecum   . Benign neoplasm of rectum   . CKD (chronic kidney disease), stage III ([place]) 03/14/2016  . Mixed hyperlipidemia 03/14/2016  . Scalp lesion 03/14/2016  . Peripheral polyneuropathy 01/26/2016  . Vitamin D deficiency 09/26/2015  . DM type 2 causing vascular disease ([place]) 08/28/2015  . Chronic upper GI bleeding 06/24/2015  . Acute renal failure ([place]) 06/24/2015  . 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 OSA (obstructive sleep apnea) 08/22/2014  . Other emphysema ([place]) 05/29/2014  . Chronic hypoxic, hypercapnic respiratory failure 04/21/2014  . Microcytic anemia 04/21/2014  . Fibromyalgia 04/14/2014  . Uncontrolled secondary diabetes mellitus with stage 3 CKD (GFR 30-59) (HCC) 04/14/2014  . Dyspnea on exertion 04/14/2014  . Acute on chronic combined systolic and diastolic CHF (congestive heart failure) ([place]) 04/14/2014  . Unspecified atrial fibrillation ([place]) 04/12/2014   PCP:  [name], NP Pharmacy:   [facility] [place] [address] [place] [address] Phone: [phone] Fax: [phone]  [facility] [place], [place] - [address]  [place] [address] Phone: [phone] Fax: [phone]  [facility] [place], [place] [address] Phone: [phone] Fax: [phone]  [facility] [facility] [place] [facility] [address] [place] [address] Phone: [phone] Fax: [phone]     Social Determinants of Health (SDOH) Interventions    Readmission Risk Interventions Readmission Risk Prevention Plan 01/25/2019  Transportation Screening Complete  PCP or Specialist Appt within 3-5 Days Complete  HRI or Home Care Consult Complete  Social Work Consult for [place] Planning/Counseling Complete  Palliative Care Screening Not Applicable  Medication Review Press photographer) Complete  Some recent data might be hidden

## 2020-03-23 NOTE — Progress Notes (Signed)
Pt has been up on BSC, heart rate up into 105-110 afib per cardiac monitor. Pt now back to bed, eating supper. Pt did not like food that was originally served to her so this nurse called kitchen and pt was brought soup and sandwich. Pt given 167ml of gingerale with her meal.  Again reviewed fluid restrictions with pt, who stated understanding.

## 2020-03-23 NOTE — ED Notes (Signed)
CRITICAL VALUE ALERT  Critical Value:  Troponin 189  Date & Time Notied:  03/23/20 & 1030  Provider Notified: EDP   Orders Received/Actions taken: Notifed

## 2020-03-23 NOTE — Progress Notes (Signed)
ANTICOAGULATION CONSULT NOTE - Initial Consult  Pharmacy Consult for:   warfarin dosing Indication: atrial fibrillation    Vital Signs: BP: 109/70 (10/21 1121) Pulse Rate: 85 (10/21 1317)  Labs: Recent Labs    03/20/20 1604 03/20/20 1842 03/21/20 0204 03/21/20 0204 03/21/20 0703 03/22/20 0307 03/23/20 0730 03/23/20 0934  HGB 12.5  --   --   --   --   --  11.6*  --   HCT 36.2  --   --   --   --   --  36.4  --   PLT 230  --   --   --   --   --  222  --   LABPROT 22.9*  --  25.0*  --   --  24.9*  --   --   INR 2.1*  --  2.4*  --   --  2.3*  --   --   CREATININE 2.85*   < > 2.02*   < > 2.03* 1.71* 1.70*  --   TROPONINIHS  --   --   --   --   --   --  159* 189*   < > = values in this interval not displayed.    Assessment: Pharmacy consulted to dose warfarin for this 69 yo diabetic female on chronic anti-coagulation with warfarin for atrial fibrillation.   She left AMA yesterday, but has returned today with SOB, but no chest pain despite elevated troponins.  CBC:  H/H: 11.6/36.4       Plates: 230>222  Home dose: warfarin 4mg  daily    Last dose:  warfarin 4mg  on 10/19 at 1611 Disease state: acute decompensated heart failure  Drug Interactions: primidone-->may induce hepatic metabolism of warfarin   Concurrent anti-platelet medications:  Aspirin  324mg  po x1 dose  Goal of Therapy:  INR 2-3 Monitor platelets by anticoagulation protocol: Yes    Plan:  Give warfarin   6mg  x1 dose today  (50% boost over usual home dose) Daily PT/INR Every other day CBC Monitor for signs and symptoms of bleeding.   Despina Pole 03/23/2020,1:58 PM

## 2020-03-23 NOTE — ED Triage Notes (Addendum)
Pt has some SOB. Was here yesterday in ICU for hyperglycemia. Has not taken her medications. States she is SOB. 100% on 3L. Breathing treatment given in route. Lung sounds clear. Pt left AMA yesterday from ICU because staff was "watching her"

## 2020-03-23 NOTE — ED Provider Notes (Addendum)
[facility] EMERGENCY DEPARTMENT Provider Note   CSN: [id number] Arrival date & time: 03/23/20  6378     History Chief Complaint  Patient presents with  . Shortness of Breath    Denise Freeman is a 69 y.o. female.  HPI   Patient presented to the emergency room for evaluation shortness of breath per the initial EMS complaint.  However, the patient primarily tells me about the issue she had while she was in the hospital yesterday.  Patient was admitted to the hospital on October 18 for hyperglycemia.  Patient was getting discharged yesterday but ended up leaving before receiving her paperwork.  Patient was upset with the care that she received.  Patient tells me that one person told her that she reminded him of his grandmother.  This statement upset her.  Patient also states they told her that they were watching her while she was on the monitors.  Patient was also upset about that.  Patient is not sure what they did while she was in the hospital.  She did leave before receiving her discharge paperwork.  Patient is not having any chest pain.  No fevers or chills.  No coughing.  Past Medical History:  Diagnosis Date  . Allergy   . Anemia   . Anxiety   . Asthma   . Atrial fibrillation ([place])   . Bipolar 1 disorder ([place])   . 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 Cholelithiasis    a. 09/2016 s/p Lap Chole.  . Chronic bronchitis ([place])   . Chronic combined systolic and diastolic congestive heart failure ([place])    a. 09/2016 Echo: EF 30-35%.  . Colon polyps   . COPD (chronic obstructive pulmonary disease) ([place])   . Depression   . Family history of adverse reaction to anesthesia    Uncle was positive for malignant hyperthermia; patient had testing done and was negative.  . Fibromyalgia   . GERD (gastroesophageal reflux disease)   . Gout   . High cholesterol   . History of blood transfusion 06/2015   "bleeding from my rectum"  . History of hiatal hernia   . HOH (hard of  hearing)   . Hx of colonic polyps 03/21/2016   3 small adenomas no recall - co-morbidities  . Neuropathy    Disc Back   . NICM (nonischemic cardiomyopathy) ([place])    a. Previously worked up in [place], [place]-->low EF with subsequent recovery.  Multiple caths (last ~ 2014 per pt report)--reportedly nl cors;  b. 09/2016 Echo: EF 30-35%, antsept/apical HK, mild MR, mildly dil LA, mod dil RA;  c. 09/2016 Lexi MV: EF 26%, glob HK, sept DK, med size, mod intensity fixed septal defect - BBB/PVC related artifact, no ischemia.  . On home oxygen therapy    "3L; 24/7" (03/14/2016)  . OSA treated with BiPAP    uses biPAP, 10 (03/14/2016)  . Osteoarthritis   . Oxygen deficiency   . Pneumonia   . Type II diabetes mellitus ([place])       Past Surgical History:  Procedure Laterality Date  . APPENDECTOMY     "they busted"  . bladder stimulator     pt states, "it cannot be turned off; it's in my right hip; dead battery so it's not working anymore". (03/14/2016)  . BLADDER SUSPENSION     2003, 2006 and 2010  . CATARACT EXTRACTION W/PHACO Right 11/29/2014   Procedure: CATARACT EXTRACTION PHACO AND INTRAOCULAR LENS PLACEMENT (IOC);  Surgeon: Rutherford Guys, MD;  Location: [facility];  Service: Ophthalmology;  Laterality: Right;  CDE:3.81  . CATARACT EXTRACTION W/PHACO Left 12/13/2014   Procedure: CATARACT EXTRACTION PHACO AND INTRAOCULAR LENS PLACEMENT (IOC);  Surgeon: Rutherford Guys, MD;  Location: [facility];  Service: Ophthalmology;  Laterality: Left;  CDE:6.59  . CERVICAL DISC SURGERY N/A 2009   4, 6, and 7 cervical disc replaced  . CHOLECYSTECTOMY N/A 09/13/2016   Procedure: LAPAROSCOPIC CHOLECYSTECTOMY;  Surgeon: Rolm Bookbinder, MD;  Location: [place];  Service: General;  Laterality: N/A;  . COLONOSCOPY WITH PROPOFOL N/A 03/15/2016   Procedure: COLONOSCOPY WITH PROPOFOL;  Surgeon: Gatha Mayer, MD;  Location: [place];  Service: Endoscopy;  Laterality: N/A;  . ESOPHAGOGASTRODUODENOSCOPY (EGD) WITH PROPOFOL  N/A 03/15/2016   Procedure: ESOPHAGOGASTRODUODENOSCOPY (EGD) WITH PROPOFOL;  Surgeon: Gatha Mayer, MD;  Location: [place];  Service: Endoscopy;  Laterality: N/A;  . HEEL SPUR SURGERY Bilateral   . HERNIA REPAIR    . I & D EXTREMITY Right 06/13/2015   Procedure: MINOR IRRIGATION AND DEBRIDEMENT EXTREMITY REMOVAL OF NAIL;  Surgeon: Daryll Brod, MD;  Location: [place];  Service: Orthopedics;  Laterality: Right;  . TUBAL LIGATION    . UMBILICAL HERNIA REPAIR     w/mesh     OB History   No obstetric history on file.     Family History  Problem Relation Age of Onset  . Heart disease Mother   . COPD Mother   . Diabetes Mother   . Breast cancer Mother   . Heart disease Father   . Hyperlipidemia Father   . COPD Sister   . Heart disease Sister   . Diabetes Sister   . Heart disease Maternal Grandmother   . Cancer Maternal Grandmother        stomach  . Cancer Maternal Grandfather        lung   . Bipolar disorder Brother     Social History   Tobacco Use  . Smoking status: Former Smoker    Packs/day: 2.00    Years: 14.00    Pack years: 28.00    Types: Cigarettes    Quit date: 04/12/1989    Years since quitting: 30.9  . Smokeless tobacco: Never Used  Vaping Use  . Vaping Use: Never used  Substance Use Topics  . Alcohol use: No    Alcohol/week: 0.0 standard drinks  . Drug use: No    Home Medications Prior to Admission medications   Medication Sig Start Date End Date Taking? Authorizing Provider  albuterol (PROVENTIL) (2.5 MG/3ML) 0.083% nebulizer solution Take 3 mLs (2.5 mg total) by nebulization every 6 (six) hours as needed for wheezing or shortness of breath. 02/17/20   Chesley Mires, MD  allopurinol (ZYLOPRIM) 100 MG tablet TAKE 1 TABLET BY MOUTH ONCE DAILY. Patient taking differently: Take 100 mg by mouth daily.  12/08/17   Lucila Maine C, DO  budesonide (PULMICORT) 0.5 MG/2ML nebulizer solution Take 2 mLs (0.5 mg total) by nebulization 2 (two)  times daily. 02/17/20   Chesley Mires, MD  cetirizine (ZYRTEC) 10 MG tablet Take 1 tablet (10 mg total) by mouth at bedtime. 01/30/17   Rogue Bussing, MD  cholecalciferol (VITAMIN D) 400 units TABS tablet Take 1 tablet (400 Units total) by mouth 2 (two) times daily. 07/17/17   Rogue Bussing, MD  clobetasol cream (TEMOVATE) 0.94 % Apply 1 application topically daily.  01/28/19   [provider]  colchicine 0.6 MG tablet  Take 1 tablet (0.6 mg total) by mouth as needed. Patient taking differently: Take 0.6 mg by mouth as needed (gout).  01/25/19   Kathie Dike, MD  escitalopram (LEXAPRO) 10 MG tablet Take 1 tablet (10 mg total) by mouth at bedtime. 10/24/17   Norman Clay, MD  FEROSUL 325 (65 Fe) MG tablet Take 325 mg by mouth daily.  01/08/19   [provider]  fluticasone (FLONASE) 50 MCG/ACT nasal spray Place 1 spray into both nostrils 2 (two) times daily.    [provider]  furosemide (LASIX) 40 MG tablet Take 1 tablet (40 mg total) by mouth daily. 03/22/20   Barton Dubois, MD  Insulin Glargine [facility]) 100 UNIT/ML Inject 50 Units into the skin 2 (two) times daily. 03/22/20   Barton Dubois, MD  insulin lispro (HUMALOG) 100 UNIT/ML KiwkPen Inject 0.16-0.18 mLs (16-18 Units total) into the skin 3 (three) times daily before meals. 03/22/20   Barton Dubois, MD  Insulin Pen Needle 32G X 4 MM MISC Use to inject insulin 5 times daily 03/27/18   Philemon Kingdom, MD  ipratropium (ATROVENT) 0.02 % nebulizer solution Take 2.5 mLs (0.5 mg total) by nebulization every 6 (six) hours as needed for wheezing or shortness of breath. 02/17/20   Chesley Mires, MD  losartan (COZAAR) 25 MG tablet Take 0.5 tablets (12.5 mg total) by mouth daily. 03/25/20   Barton Dubois, MD  metolazone (ZAROXOLYN) 5 MG tablet Take 1 tablet (5 mg total) by mouth 3 (three) times a week. 03/24/20 06/22/20  Barton Dubois, MD  metoprolol succinate (TOPROL-XL) 25 MG 24 hr tablet TAKE 1  TABLET BY MOUTH TWICE DAILY Patient taking differently: Take 25 mg by mouth in the morning and at bedtime.  09/23/19   Hilty, Nadean Corwin, MD  mirtazapine (REMERON) 15 MG tablet Take 1 tablet (15 mg total) by mouth at bedtime. 10/24/17   Norman Clay, MD  Misc Natural Products (TART CHERRY ADVANCED PO) Take by mouth daily.    [provider]  modafinil (PROVIGIL) 200 MG tablet Take 1 tablet (200 mg total) by mouth daily. 02/17/20   Chesley Mires, MD  montelukast (SINGULAIR) 10 MG tablet Take 1 tablet (10 mg total) by mouth at bedtime. 02/17/20   Chesley Mires, MD  nitroGLYCERIN (NITROSTAT) 0.4 MG SL tablet Place 0.4 mg under the tongue every 5 (five) minutes as needed for chest pain.     [provider]  oxyCODONE-acetaminophen (PERCOCET/ROXICET) 5-325 MG tablet Take 1 tablet by mouth 2 (two) times daily as needed for severe pain. 11/27/17   Rogue Bussing, MD  pantoprazole (PROTONIX) 20 MG tablet TAKE (1) TABLET BY MOUTH TWICE A DAY BEFORE MEALS. (BREAKFAST AND SUPPER) Patient taking differently: Take 20 mg by mouth 2 (two) times daily before a meal.  12/02/17   Riccio, Gardiner Rhyme, DO  potassium chloride SA (KLOR-CON) 20 MEQ tablet TAKE 1 TABLET BY MOUTH TWICE DAILY Patient taking differently: Take 20 mEq by mouth 2 (two) times daily.  02/11/20   Hilty, Nadean Corwin, MD  primidone (MYSOLINE) 50 MG tablet TAKE ONE TABLET BY MOUTH AT BEDTIME. Patient taking differently: Take 50 mg by mouth at bedtime.  11/10/17   Rogue Bussing, MD  Probiotic Product (PROBIOTIC PO) Take 1 tablet by mouth at bedtime.    [provider]  TRUE METRIX BLOOD GLUCOSE TEST test strip USE TO TEST BLOOD SUGAR AS DIRECTED. 09/24/17   Cassandria Anger, MD  Vitamin A 2400 MCG (8000 UT) CAPS  Take 8,000 Units by mouth every morning.     [provider]  Vitamin D, Ergocalciferol, (DRISDOL) 1.25 MG (50000 UNIT) CAPS capsule Take 1 capsule (50,000 Units total) by mouth every 7 (seven) days.  03/22/20   Barton Dubois, MD  warfarin (COUMADIN) 4 MG tablet Take 2 tablets tonight, 1 1/2 tablets tomorrow night then increase dose to 1 tablet daily Patient taking differently: Take 4 mg by mouth daily.  03/02/20   Hilty, Nadean Corwin, MD  potassium chloride 20 MEQ TBCR Take 20 mEq by mouth 2 (two) times daily. 05/02/19 07/02/19  Barton Dubois, MD    Allergies    Oseltamivir, Xarelto [rivaroxaban], Ancef [cefazolin], Levaquin [levofloxacin in d5w], Lyrica [pregabalin], Tamiflu [oseltamivir phosphate], Zoloft [sertraline hcl], Torsemide, Augmentin [amoxicillin-pot clavulanate], Ciprofloxacin, Haldol [haloperidol], Nsaids, Penicillins, and Topamax [topiramate]  Review of Systems   Review of Systems  All other systems reviewed and are negative.   Physical Exam Updated Vital Signs BP 126/72   Pulse (!) 115   Resp (!) 29   SpO2 97%   Physical Exam Vitals and nursing note reviewed.  Constitutional:      Appearance: She is well-developed. She is not toxic-appearing or diaphoretic.  HENT:     Head: Normocephalic and atraumatic.     Right Ear: External ear normal.     Left Ear: External ear normal.  Eyes:     General: No scleral icterus.       Right eye: No discharge.        Left eye: No discharge.     Conjunctiva/sclera: Conjunctivae normal.  Neck:     Trachea: No tracheal deviation.  Cardiovascular:     Rate and Rhythm: Normal rate and regular rhythm.  Pulmonary:     Effort: Pulmonary effort is normal. No respiratory distress.     Breath sounds: Normal breath sounds. No stridor. No wheezing or rales.     Comments: Oxygen saturation is normal on 3 L of nasal cannula oxygen, this is the patient's baseline amount of home oxygen Abdominal:     General: Bowel sounds are normal. There is no distension.     Palpations: Abdomen is soft.     Tenderness: There is no abdominal tenderness. There is no guarding or rebound.  Musculoskeletal:        General: No tenderness.     Cervical  back: Neck supple.  Skin:    General: Skin is warm and dry.     Findings: No rash.  Neurological:     Mental Status: She is alert.     Cranial Nerves: No cranial nerve deficit (no facial droop, extraocular movements intact, no slurred speech).     Sensory: No sensory deficit.     Motor: No abnormal muscle tone or seizure activity.     Coordination: Coordination normal.  Psychiatric:        Mood and Affect: Mood is anxious.     ED Results / Procedures / Treatments   Labs (all labs ordered are listed, but only abnormal results are displayed) Labs Reviewed  BASIC METABOLIC PANEL - Abnormal; Notable for the following components:      Result Value   Chloride 92 (*)    CO2 33 (*)    Glucose, Bld 331 (*)    BUN 69 (*)    Creatinine, Ser 1.70 (*)    GFR, Estimated 32 (*)    All other components within normal limits  CBC - Abnormal; Notable for the following components:  WBC 11.5 (*)    Hemoglobin 11.6 (*)    All other components within normal limits  BRAIN NATRIURETIC PEPTIDE - Abnormal; Notable for the following components:   B Natriuretic Peptide 925.0 (*)    All other components within normal limits  TROPONIN I (HIGH SENSITIVITY) - Abnormal; Notable for the following components:   Troponin I (High Sensitivity) 159 (*)    All other components within normal limits  TROPONIN I (HIGH SENSITIVITY) - Abnormal; Notable for the following components:   Troponin I (High Sensitivity) 189 (*)    All other components within normal limits    EKG EKG Interpretation  Date/Time:  Thursday March 23 2020 07:31:50 EDT Ventricular Rate:  118 PR Interval:    QRS Duration: 78 QT Interval:  364 QTC Calculation: 506 R Axis:   -111 Text Interpretation: Atrial fibrillation Ventricular premature complex Consider inferior infarct Anterolateral infarct, age indeterminate Prolonged QT interval No significant change since last tracing Confirmed by Dorie Rank [phone]) on 03/23/2020 7:38:25  AM   Radiology DG Chest Port 1 View  Result Date: 03/23/2020 CLINICAL DATA:  Shortness of breath EXAM: PORTABLE CHEST 1 VIEW COMPARISON:  March 20, 2020 FINDINGS: Lungs are mildly hyperexpanded. There is bibasilar atelectasis. There is no edema or airspace opacity. There is cardiomegaly with pulmonary vascularity within normal limits, stable. No adenopathy. Postoperative change in cervical spine. There is degenerative change in each shoulder. IMPRESSION: Lungs hyperexpanded, stable. Bibasilar atelectasis. No edema or airspace opacity. Stable cardiomegaly. Electronically Signed   By: Lowella Grip III M.D.   On: 03/23/2020 07:54    Procedures .Critical Care Performed by: Dorie Rank, MD Authorized by: Dorie Rank, MD   Critical care provider statement:    Critical care time (minutes):  45   Critical care was time spent personally by me on the following activities:  Discussions with consultants, evaluation of patient's response to treatment, examination of patient, ordering and performing treatments and interventions, ordering and review of laboratory studies, ordering and review of radiographic studies, pulse oximetry, re-evaluation of patient's condition, obtaining history from patient or surrogate and review of old charts   (including critical care time)  Medications Ordered in ED Medications  nitroGLYCERIN (NITROGLYN) 2 % ointment 1 inch (1 inch Topical Given 03/23/20 0847)  diltiazem (CARDIZEM) injection 10 mg (has no administration in time range)  aspirin chewable tablet 324 mg (324 mg Oral Given 03/23/20 0847)  furosemide (LASIX) injection 20 mg (20 mg Intravenous Given 03/23/20 0928)  magnesium oxide (MAG-OX) tablet 400 mg (400 mg Oral Given 03/23/20 5176)    ED Course  I have reviewed the triage vital signs and the nursing notes.  Pertinent labs & imaging results that were available during my care of the patient were reviewed by me and considered in my medical decision  making (see chart for details).  Clinical Course as of Mar 24 1051  Thu Mar 23, 2020  1607 BNP elevated.  Increased from previous   [JK]  0833 Chest x-ray without findings of pulmonary edema   [JK]  0838 Troponin elevated at 159   [JK]  0848 Patient continues to feel like she is having difficulty with her breathing.  Oxygenation is 100% at the bedside   [JK]  1048 Troponin increased from previous   [JK]    Clinical Course User Index [JK] Dorie Rank, MD   MDM Rules/Calculators/A&P  Patient presented to ED with complaints of shortness of breath.  Patient does have history of multiple medical problems.  Was just recently discharged from the ICU after being treated for hyperglycemia.  [name] laboratory tests are notable for an elevated BNP.  Patient also has elevated troponins.  She denies any chest pain.  I suspect the elevated troponin is related to CHF exacerbation rather than acute coronary ischemia.  Patient also has chronic atrial fibrillation and has noted to be tachycardic.  Patient also remains hyperglycemic.  Is not clear that she has been compliant with her outpatient regimen.  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  I have ordered a dose of diuretics.  Nitroglycerin and Cardizem have also been ordered.  I will consult the medical service for admission and further treatment.   Final Clinical Impression(s) / ED Diagnoses Final diagnoses:  Acute on chronic congestive heart failure, unspecified heart failure type [facility])  Atrial fibrillation, unspecified type ([place])  Elevated troponin level         Dorie Rank, MD 03/23/20 1108

## 2020-03-23 NOTE — H&P (Signed)
HPI  Denise Freeman GYJ:[id number] DOB: [birth year] DOA: 03/23/2020  PCP: [name], NP   Chief Complaint: "Shortness of breath and cannot breathe"  HPI:  69 year old DM TY 2 with poor control Atrial fibrillation CHADS2 score >4 on Coumadin (previously on Xarelto which was stopped secondary to risk of GI bleed and vision loss) Chronic respiratory failure secondary to COPD/CHF on 3 L of oxygen OSA on CPAP followed by Dr. Halford Chessman of pulmonology CKD stage III/IIIb Prior melena with about 20 polyps found on scope 2017 by Dr. Arelia Longest Baseline weight around 91 kg in the past Nonischemic cardiomyopathy with multiple caths in the past demonstrating nonobstructive disease-EF 50-55% 04/29/2019 with pseudonormalization--- last cardiac cath sometime in 2015 BMI >34  She was just admitted 10/18 through 03/22/2020 and discharged after a short stay for DKA-at the hospital stay she received usual protocol DKA order sets and it appears she left [place] as she stated she needed to go to the pharmacy which would close and could not wait for that and did not take any medication subsequently  Returns to [facility] with shortness of breath and inability to lay flat which she says has been going on for 3 or 4 days-this seems to predate her admission recently States she has no chest pain but has chronic atrial fibrillation I asked her carefully and directlyif she is having chest pain and she repeats that she is not having chest pain but then goes on to reiterate that she has atrial fibrillation She seems quite anxious at the bedside and this is confirmed by nursing staff who have been taking care of her she is panting as if she is short of breath but then is able to verbalize in full sentences her concerns and complaints about leaving the hospital yesterday.  Work-up showed troponin trending from 1 50-1 89 Chest x-ray hyperexpanded bibasilar atelectasis no edema or airspace opacity  with stable cardiomegaly BUNs/creatinine 69/1.7 down from yesterday stay 99/1.7 CO2 33 Chloride 92 Anion gap 14 BNP 925  White count 11.5 hemoglobin 11.6 platelet 222 glucose 331  Review of Systems:  Pertinent -"s:  No cough no cold no melena no blurred vision no double vision no unilateral weakness no sputum she tells me that she is more swollen than usual  And feels heavier than usual   ED Course: Given she had not taken medications in over 24 hours ED started the patient on aspirin 324, given a dose of Cardizem IV 10 mg Lasix 20 mg 6 units of insulin and placed on Nitropaste 2% 1 inch they also initiated a fluid limitation on her  Past Medical History:  Diagnosis Date  . Allergy   . Anemia   . Anxiety   . Asthma   . Atrial fibrillation ([place])   . Bipolar 1 disorder ([place])   . 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 Cholelithiasis    a. 09/2016 s/p Lap Chole.  . Chronic bronchitis ([place])   . Chronic combined systolic and diastolic congestive heart failure ([place])    a. 09/2016 Echo: EF 30-35%.  . Colon polyps   . COPD (chronic obstructive pulmonary disease) ([place])   . Depression   . Family history of adverse reaction to anesthesia    Uncle was positive for malignant hyperthermia; patient had testing done and was negative.  . Fibromyalgia   . GERD (gastroesophageal reflux disease)   . Gout   . High cholesterol   . History  of blood transfusion 06/2015   "bleeding from my rectum"  . History of hiatal hernia   . HOH (hard of hearing)   . Hx of colonic polyps 03/21/2016   3 small adenomas no recall - co-morbidities  . Neuropathy    Disc Back   . NICM (nonischemic cardiomyopathy) ([place])    a. Previously worked up in [place], MD-->low EF with subsequent recovery.  Multiple caths (last ~ 2014 per pt report)--reportedly nl cors;  b. 09/2016 Echo: EF 30-35%, antsept/apical HK, mild MR, mildly dil LA, mod dil RA;  c. 09/2016 Lexi MV: EF 26%, glob HK, sept DK, med size, mod  intensity fixed septal defect - BBB/PVC related artifact, no ischemia.  . On home oxygen therapy    "3L; 24/7" (03/14/2016)  . OSA treated with BiPAP    uses biPAP, 10 (03/14/2016)  . Osteoarthritis   . Oxygen deficiency   . Pneumonia   . Type II diabetes mellitus ([place])    Past Surgical History:  Procedure Laterality Date  . APPENDECTOMY     "they busted"  . bladder stimulator     pt states, "it cannot be turned off; it's in my right hip; dead battery so it's not working anymore". (03/14/2016)  . BLADDER SUSPENSION     2003, 2006 and 2010  . CATARACT EXTRACTION W/PHACO Right 11/29/2014   Procedure: CATARACT EXTRACTION PHACO AND INTRAOCULAR LENS PLACEMENT (IOC);  Surgeon: Rutherford Guys, MD;  Location: [facility];  Service: Ophthalmology;  Laterality: Right;  CDE:3.81  . CATARACT EXTRACTION W/PHACO Left 12/13/2014   Procedure: CATARACT EXTRACTION PHACO AND INTRAOCULAR LENS PLACEMENT (IOC);  Surgeon: Rutherford Guys, MD;  Location: [facility];  Service: Ophthalmology;  Laterality: Left;  CDE:6.59  . CERVICAL DISC SURGERY N/A 2009   4, 6, and 7 cervical disc replaced  . CHOLECYSTECTOMY N/A 09/13/2016   Procedure: LAPAROSCOPIC CHOLECYSTECTOMY;  Surgeon: Rolm Bookbinder, MD;  Location: [place];  Service: General;  Laterality: N/A;  . COLONOSCOPY WITH PROPOFOL N/A 03/15/2016   Procedure: COLONOSCOPY WITH PROPOFOL;  Surgeon: Gatha Mayer, MD;  Location: [place];  Service: Endoscopy;  Laterality: N/A;  . ESOPHAGOGASTRODUODENOSCOPY (EGD) WITH PROPOFOL N/A 03/15/2016   Procedure: ESOPHAGOGASTRODUODENOSCOPY (EGD) WITH PROPOFOL;  Surgeon: Gatha Mayer, MD;  Location: [place];  Service: Endoscopy;  Laterality: N/A;  . HEEL SPUR SURGERY Bilateral   . HERNIA REPAIR    . I & D EXTREMITY Right 06/13/2015   Procedure: MINOR IRRIGATION AND DEBRIDEMENT EXTREMITY REMOVAL OF NAIL;  Surgeon: Daryll Brod, MD;  Location: [place];  Service: Orthopedics;  Laterality: Right;  . TUBAL LIGATION      . UMBILICAL HERNIA REPAIR     w/mesh    reports that she quit smoking about 30 years ago. Her smoking use included cigarettes. She has a 28.00 pack-year smoking history. She has never used smokeless tobacco. She reports that she does not drink alcohol and does not use drugs.  Mobility: She is somewhat independent at baseline lives with her husband and her brother at home  Allergies  Allergen Reactions  . Oseltamivir Hives    Other reaction(s): Other (see comments) "water blisters"  . Xarelto [Rivaroxaban] Other (See Comments)    Internal bleeding  . Ancef [Cefazolin] Nausea And Vomiting  . Levaquin [Levofloxacin In D5w] Other (See Comments)    "afib"  . Lyrica [Pregabalin] Hives  . Tamiflu [Oseltamivir Phosphate] Other (See Comments)    "water blisters"  . Zoloft [Sertraline Hcl] Other (See Comments)  Jaw problems, jittery  . Torsemide Other (See Comments)    Not effective  . Augmentin [Amoxicillin-Pot Clavulanate] Itching  . Ciprofloxacin Itching and Nausea And Vomiting  . Haldol [Haloperidol] Other (See Comments)    Restless leg  . Nsaids Diarrhea  . Penicillins Itching, Nausea And Vomiting and Rash    Has patient had a PCN reaction causing immediate rash, facial/tongue/throat swelling, SOB or lightheadedness with hypotension: Yes Has patient had a PCN reaction causing severe rash involving mucus membranes or skin necrosis: No Has patient had a PCN reaction that required hospitalization No Has patient had a PCN reaction occurring within the last 10 years: Yes If all of the above answers are "NO", then may proceed with Cephalosporin use.  . Topamax [Topiramate] Nausea Only   Family History  Problem Relation Age of Onset  . Heart disease Mother   . COPD Mother   . Diabetes Mother   . Breast cancer Mother   . Heart disease Father   . Hyperlipidemia Father   . COPD Sister   . Heart disease Sister   . Diabetes Sister   . Heart disease Maternal Grandmother   . Cancer  Maternal Grandmother        stomach  . Cancer Maternal Grandfather        lung   . Bipolar disorder Brother    Prior to Admission medications   Medication Sig Start Date End Date Taking? Authorizing Provider  albuterol (PROVENTIL) (2.5 MG/3ML) 0.083% nebulizer solution Take 3 mLs (2.5 mg total) by nebulization every 6 (six) hours as needed for wheezing or shortness of breath. 02/17/20   Chesley Mires, MD  allopurinol (ZYLOPRIM) 100 MG tablet TAKE 1 TABLET BY MOUTH ONCE DAILY. Patient taking differently: Take 100 mg by mouth daily.  12/08/17   Lucila Maine C, DO  budesonide (PULMICORT) 0.5 MG/2ML nebulizer solution Take 2 mLs (0.5 mg total) by nebulization 2 (two) times daily. 02/17/20   Chesley Mires, MD  cetirizine (ZYRTEC) 10 MG tablet Take 1 tablet (10 mg total) by mouth at bedtime. 01/30/17   Rogue Bussing, MD  cholecalciferol (VITAMIN D) 400 units TABS tablet Take 1 tablet (400 Units total) by mouth 2 (two) times daily. 07/17/17   Rogue Bussing, MD  clobetasol cream (TEMOVATE) 6.29 % Apply 1 application topically daily.  01/28/19   [provider]  colchicine 0.6 MG tablet Take 1 tablet (0.6 mg total) by mouth as needed. Patient taking differently: Take 0.6 mg by mouth as needed (gout).  01/25/19   Kathie Dike, MD  escitalopram (LEXAPRO) 10 MG tablet Take 1 tablet (10 mg total) by mouth at bedtime. 10/24/17   Norman Clay, MD  FEROSUL 325 (65 Fe) MG tablet Take 325 mg by mouth daily.  01/08/19   [provider]  fluticasone (FLONASE) 50 MCG/ACT nasal spray Place 1 spray into both nostrils 2 (two) times daily.    [provider]  furosemide (LASIX) 40 MG tablet Take 1 tablet (40 mg total) by mouth daily. 03/22/20   Barton Dubois, MD  Insulin Glargine [facility]) 100 UNIT/ML Inject 50 Units into the skin 2 (two) times daily. 03/22/20   Barton Dubois, MD  insulin lispro (HUMALOG) 100 UNIT/ML KiwkPen Inject 0.16-0.18 mLs (16-18 Units total)  into the skin 3 (three) times daily before meals. 03/22/20   Barton Dubois, MD  Insulin Pen Needle 32G X 4 MM MISC Use to inject insulin 5 times daily 03/27/18   Philemon Kingdom, MD  ipratropium (ATROVENT) 0.02 % nebulizer solution Take 2.5 mLs (0.5 mg total) by nebulization every 6 (six) hours as needed for wheezing or shortness of breath. 02/17/20   Chesley Mires, MD  losartan (COZAAR) 25 MG tablet Take 0.5 tablets (12.5 mg total) by mouth daily. 03/25/20   Barton Dubois, MD  metolazone (ZAROXOLYN) 5 MG tablet Take 1 tablet (5 mg total) by mouth 3 (three) times a week. 03/24/20 06/22/20  Barton Dubois, MD  metoprolol succinate (TOPROL-XL) 25 MG 24 hr tablet TAKE 1 TABLET BY MOUTH TWICE DAILY Patient taking differently: Take 25 mg by mouth in the morning and at bedtime.  09/23/19   Hilty, Nadean Corwin, MD  mirtazapine (REMERON) 15 MG tablet Take 1 tablet (15 mg total) by mouth at bedtime. 10/24/17   Norman Clay, MD  Misc Natural Products (TART CHERRY ADVANCED PO) Take by mouth daily.    [provider]  modafinil (PROVIGIL) 200 MG tablet Take 1 tablet (200 mg total) by mouth daily. 02/17/20   Chesley Mires, MD  montelukast (SINGULAIR) 10 MG tablet Take 1 tablet (10 mg total) by mouth at bedtime. 02/17/20   Chesley Mires, MD  nitroGLYCERIN (NITROSTAT) 0.4 MG SL tablet Place 0.4 mg under the tongue every 5 (five) minutes as needed for chest pain.     [provider]  oxyCODONE-acetaminophen (PERCOCET/ROXICET) 5-325 MG tablet Take 1 tablet by mouth 2 (two) times daily as needed for severe pain. 11/27/17   Rogue Bussing, MD  pantoprazole (PROTONIX) 20 MG tablet TAKE (1) TABLET BY MOUTH TWICE A DAY BEFORE MEALS. (BREAKFAST AND SUPPER) Patient taking differently: Take 20 mg by mouth 2 (two) times daily before a meal.  12/02/17   Riccio, Gardiner Rhyme, DO  potassium chloride SA (KLOR-CON) 20 MEQ tablet TAKE 1 TABLET BY MOUTH TWICE DAILY Patient taking differently: Take 20 mEq by mouth 2  (two) times daily.  02/11/20   Hilty, Nadean [place], MD  primidone (MYSOLINE) 50 MG tablet TAKE ONE TABLET BY MOUTH AT BEDTIME. Patient taking differently: Take 50 mg by mouth at bedtime.  11/10/17   Rogue Bussing, MD  Probiotic Product (PROBIOTIC PO) Take 1 tablet by mouth at bedtime.    [provider]  TRUE METRIX BLOOD GLUCOSE TEST test strip USE TO TEST BLOOD SUGAR AS DIRECTED. 09/24/17   Cassandria Anger, MD  Vitamin A 2400 MCG (8000 UT) CAPS Take 8,000 Units by mouth every morning.     [provider]  Vitamin D, Ergocalciferol, (DRISDOL) 1.25 MG (50000 UNIT) CAPS capsule Take 1 capsule (50,000 Units total) by mouth every 7 (seven) days. 03/22/20   Barton Dubois, MD  warfarin (COUMADIN) 4 MG tablet Take 2 tablets tonight, 1 1/2 tablets tomorrow night then increase dose to 1 tablet daily Patient taking differently: Take 4 mg by mouth daily.  03/02/20   Hilty, Nadean [place], MD  potassium chloride 20 MEQ TBCR Take 20 mEq by mouth 2 (two) times daily. 05/02/19 07/02/19  Barton Dubois, MD    Physical Exam:  Vitals:   03/23/20 1100 03/23/20 1121  BP:  109/70  Pulse: (!) 139 (!) 121  Resp: (!) 29 (!) 27  SpO2: 99% 100%     Awake alert very thick neck large tongue protruding from mouth Mallampati 4 cannot see the back of her throat mild JVD noted but difficult exam given habitus  No wheeze no rales no rhonchi on exam  S1-S2 tachycardic seems to be atrial flutter/fib  Abdomen obese slightly  tender  Lower extremities are swollen with grade 1-2 lower extremity edema  Neurologically intact moving all 4 limbs equally  She has some venous stasis prior changes  Patient exhibits some significant anxiety but is redirectable to some degree     I have personally reviewed following labs and imaging studies  Labs:   As above  Imaging studies:   As above  Medical tests:   EKG independently reviewed: EKG very poor tracing sinus rhythm/A. fib  Test  discussed with performing physician:  No  Decision to obtain old records:   Yes  Review and summation of old records:   Yes  Active Problems:   * No active hospital problems. 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 fib with RVR I have resumed metoprolol 25 mg twice daily even though she may have an acute exacerbation of heart failure as I feel this is necessary to control her rate She may have tachyarrhythmia related heart failure which may be causing the elevated troponin She has nonobstructive disease on last cath at [facility] but will probably need evaluation by cardiology this admission I have considered PE in my differential but do not think that this is in keeping with her symptoms and findings  Atrial fibrillation on Coumadin Resume metoprolol as above Will need pharmacy to dose Coumadin her home dose is usually 4 mg She is not a candidate for DOAC as she has had large GI bleed in the past and is at risk of falls and it was felt by cardiology it seems like that she was a better candidate for Coumadin  AKI CKD stage III Baseline creatinine prior to hospitalization 10/18 was less than 1.5 On admission last hospital stay she was 136/2.8 Her creatinine is slowly improving but not back to her baseline She may have some element of cardiorenal syndrome  Poorly controlled hyperglycemia diabetes mellitus type 2 Resumed home meds of Basaglar 50 units twice daily in addition to lispro insulin 16-18 3 times daily meals She  will probably need to resistant scale in addition if her sugars are not controlled  OSA on CPAP at home Follows with pulmonology in the outpatient setting we will order CPAP on admission at night with home settings  Chronic respiratory failure combination of COPD and CHF on 3 L of oxygen During the day will require 3 L of oxygen Will need desat screen and evaluation by home health  Prior GI bleed in the past Prior history of bleeding in the past monitor trends okay for Coumadin at this time  Severity of Illness: The appropriate patient status for this patient is INPATIENT. Inpatient status is judged to be reasonable and necessary in order to provide the required intensity of service to ensure the patient's safety. The patient's presenting symptoms, physical exam findings, and initial radiographic and laboratory data in the context of their chronic comorbidities is felt to place them at high risk for further clinical deterioration. Furthermore, it is not anticipated that the patient will be medically stable for discharge from  the hospital within 2 midnights of admission. The following factors support the patient status of inpatient.   " The patient's presenting symptoms include shortness of breath wheeze. " The worrisome physical exam findings include shortness of breath hypoxia. " The initial radiographic and laboratory data are worrisome because of elevated BNP and troponin. " The chronic co-morbidities include obesity diabetes.   * I certify that at the point of admission it is my clinical judgment that the patient will require inpatient hospital care spanning beyond 2 midnights from the point of admission due to high intensity of service, high risk for further deterioration and high frequency of surveillance required.*  DVT prophylaxis: Coumadin Code Status: Full Family Communication: None Consults called: Cardiology  Time spent: 68 minutes  Verlon Au, MD [days-call my NP partners at  night for Care related issues] [facility] --[facility] [employer] [facility] , [email or web address]; password [facility]  03/23/2020, 12:42 PM

## 2020-03-23 NOTE — Progress Notes (Signed)
Inpatient Diabetes Program Recommendations  AACE/ADA: New Consensus Statement on Inpatient Glycemic Control (2015)  Target Ranges:  Prepandial:   less than 140 mg/dL      Peak postprandial:   less than 180 mg/dL (1-2 hours)      Critically ill patients:  140 - 180 mg/dL   Lab Results  Component Value Date   GLUCAP 262 (H) 03/22/2020   HGBA1C 13.5 (H) 03/20/2020    Review of Glycemic Control Results for CANDEE, HOON (MRN [id number]) as of 03/23/2020 10:42  Ref. Range 03/22/2020 03:07 03/23/2020 07:30  Glucose Latest Ref Range: 70 - 99 mg/dL 335 (H) 331 (H)   Diabetes history: Type 2 DM Outpatient Diabetes medications: Basaglar 40 units BID, Humalog 16-18 units TID Current orders for Inpatient glycemic control: none  Inpatient Diabetes Program Recommendations:    From previous admission, consider adding back insulin regimen: -Levemir 40 units BID -Novolog 0-20 units TID & HS   Had planned to speak with patient on 10/20, left AMA. Just presented back to ED with SOB. Will plan to speak with patient when appropriate.  Thanks, Bronson Curb, MSN, RNC-OB Diabetes Coordinator [phone] (8a-5p)

## 2020-03-23 NOTE — ED Notes (Signed)
CRITICAL VALUE ALERT  Critical Value:  Trop 159  Date & Time Notied:  04/23/2020 0835  Provider Notified: Dr. Tomi Bamberger  Orders Received/Actions taken: None yet

## 2020-03-23 NOTE — Progress Notes (Signed)
Pt arrived to room 332 from ED at 1355. Oriented to room and safety procedures. Telemetry applied, afib 101/min. Resp 28-32/min, but patient is talking non-stop, complaining about her care in ICU this week and about her discharge yesterday. Offered apology to patient and offered to contact patient representative. Pt states she has already talked with Amy in the ED before coming to this unit. Skin pale, warm, dry and flaky. SaO2 98% on 4lpm Denise Freeman (was on 5 lpm [place] when arrived from ED, states is on 3 lpm [place] at home). Purewick applied per pt request. Pt requesting more liquids to drink, advised pt that she is on 582ml per every 8 hours restrictions and that she has already had her total for this 8 hours as I was advised in report from ED. Explained to pt that she can have more fluids at 1900. Pt agreeable.

## 2020-03-24 DIAGNOSIS — I509 Heart failure, unspecified: Secondary | ICD-10-CM | POA: Diagnosis not present

## 2020-03-24 DIAGNOSIS — I5033 Acute on chronic diastolic (congestive) heart failure: Secondary | ICD-10-CM

## 2020-03-24 LAB — BASIC METABOLIC PANEL
Anion gap: 14 (ref 5–15)
BUN: 67 mg/dL — ABNORMAL HIGH (ref 8–23)
CO2: 32 mmol/L (ref 22–32)
Calcium: 9.2 mg/dL (ref 8.9–10.3)
Chloride: 92 mmol/L — ABNORMAL LOW (ref 98–111)
Creatinine, Ser: 1.78 mg/dL — ABNORMAL HIGH (ref 0.44–1.00)
GFR, Estimated: 31 mL/min — ABNORMAL LOW (ref >60)
Glucose, Bld: 217 mg/dL — ABNORMAL HIGH (ref 70–99)
Potassium: 4.3 mmol/L (ref 3.5–5.1)
Sodium: 138 mmol/L (ref 135–145)

## 2020-03-24 LAB — GLUCOSE, CAPILLARY
Glucose-Capillary: 257 mg/dL — ABNORMAL HIGH (ref 70–99)
Glucose-Capillary: 179 mg/dL — ABNORMAL HIGH (ref 70–99)
Glucose-Capillary: 69 mg/dL — ABNORMAL LOW (ref 70–99)
Glucose-Capillary: 125 mg/dL — ABNORMAL HIGH (ref 70–99)
Glucose-Capillary: 156 mg/dL — ABNORMAL HIGH (ref 70–99)
Glucose-Capillary: 295 mg/dL — ABNORMAL HIGH (ref 70–99)

## 2020-03-24 LAB — PROTIME-INR
INR: 1.9 — ABNORMAL HIGH (ref 0.8–1.2)
Prothrombin Time: 21.2 seconds — ABNORMAL HIGH (ref 11.4–15.2)

## 2020-03-24 MED ORDER — INSULIN GLARGINE 100 UNIT/ML ~~LOC~~ SOLN
35.0000 [IU] | Freq: Two times a day (BID) | SUBCUTANEOUS | Status: DC
  Administered 2020-03-24 – 2020-03-25 (×2): 35 [IU] via SUBCUTANEOUS
  Filled 2020-03-24 (×7): qty 0.35

## 2020-03-24 MED ORDER — DEXTROSE 50 % IV SOLN
50.0000 mL | Freq: Once | INTRAVENOUS | Status: AC
  Administered 2020-03-24: 50 mL via INTRAVENOUS

## 2020-03-24 MED ORDER — FUROSEMIDE 10 MG/ML IJ SOLN
60.0000 mg | Freq: Once | INTRAMUSCULAR | Status: AC
  Administered 2020-03-24: 60 mg via INTRAVENOUS
  Filled 2020-03-24: qty 6

## 2020-03-24 MED ORDER — WARFARIN SODIUM 1 MG PO TABS
4.0000 mg | ORAL_TABLET | Freq: Once | ORAL | Status: AC
  Administered 2020-03-24: 4 mg via ORAL
  Filled 2020-03-24: qty 4

## 2020-03-24 MED ORDER — DEXTROSE 50 % IV SOLN
INTRAVENOUS | Status: AC
  Filled 2020-03-24: qty 50

## 2020-03-24 MED ORDER — INSULIN ASPART 100 UNIT/ML ~~LOC~~ SOLN
6.0000 [IU] | Freq: Three times a day (TID) | SUBCUTANEOUS | Status: DC
  Administered 2020-03-25 (×2): 6 [IU] via SUBCUTANEOUS

## 2020-03-24 NOTE — Progress Notes (Signed)
Follow-up CBG 125, notified Dr. Verlon Au. States will adjust insulin orders, nursing to recheck CBG around shift change and notify him of any concerns. Assigned RN made aware.

## 2020-03-24 NOTE — Progress Notes (Signed)
Notified Dr. Verlon Au, BP 90/64. Nitro paste noted to upper right shoulder/back area. Remove per MD. Order d/c'd, removed as ordered. Dextrose 50 ml given as ordered for hypoglycemia. Nursing to recheck CBG in 25 min and page him with results.

## 2020-03-24 NOTE — Progress Notes (Signed)
ANTICOAGULATION CONSULT NOTE -  Pharmacy Consult for:   warfarin dosing Indication: atrial fibrillation   Height: 4\' 11"  (149.9 cm) Weight: 76.2 kg (168 lb 1.6 oz) IBW/kg (Calculated) : 43.2Vital Signs: Temp: 97.8 F (36.6 C) (10/22 0420) Temp Source: Oral (10/22 0420) BP: 146/101 (10/22 0923) Pulse Rate: 105 (10/22 0923)  Labs: Recent Labs    03/22/20 0307 03/23/20 0730 03/23/20 0934  HGB  --  11.6*  --   HCT  --  36.4  --   PLT  --  222  --   LABPROT 24.9* 20.0*  --   INR 2.3* 1.8*  --   CREATININE 1.71* 1.70*  --   TROPONINIHS  --  159* 189*    Assessment: Pharmacy consulted to dose warfarin for this 69 yo diabetic female on chronic anti-coagulation with warfarin for atrial fibrillation.   She left AMA yesterday, but has returned today with SOB, but no chest pain despite elevated troponins.  CBC:  H/H: 11.6/36.4       Plates: 230>222  Home dose: warfarin 4mg  daily    Last dose:  warfarin 4mg  on 10/19 at 1611 Disease state: acute decompensated heart failure  Drug Interactions: primidone-->may induce hepatic metabolism of warfarin 10/22  Patient refusing labs, INR 1.8 yesterday. Will dose conservatively.  Goal of Therapy:  INR 2-3 Monitor platelets by anticoagulation protocol: Yes    Plan:  Give warfarin  4mg  x1 dose today  Daily PT/INR Every other day CBC Monitor for signs and symptoms of bleeding.  Isac Sarna, BS [place]ena [place], BCPS Clinical Pharmacist Pager [phone] 03/24/2020,10:45 AM

## 2020-03-24 NOTE — Telephone Encounter (Signed)
Lm x3 for patien. Letter has been mailed to address on file per office protocol.

## 2020-03-24 NOTE — Progress Notes (Signed)
PROGRESS NOTE    Denise Freeman  PZW:[id number] DOB: [birth year] DOA: 03/23/2020 PCP: [name], NP  Brief Narrative:  69 year old DM TY 2 with poor control Atrial fibrillation CHADS2 score >4 on Coumadin (previously on Xarelto which was stopped secondary to risk of GI bleed and vision loss) Chronic respiratory failure secondary to COPD/CHF on 3 L of oxygen OSA on CPAP followed by Dr. Halford Chessman of pulmonology CKD stage III/IIIb Prior melena with about 20 polyps found on scope 2017 by Dr. Arelia Longest Baseline weight around 91 kg in the past Nonischemic cardiomyopathy with multiple caths in the past demonstrating nonobstructive disease-EF 50-55% 04/29/2019 with pseudonormalization--- last cardiac cath sometime in 2015 BMI >34  Recent admission 10/18 through 03/23/2019 2021 for DKA and was volume resuscitated at that time but left AMA and did not obtain medications  Tells me on admission difficulty laying flat with with shortness of breath and was very anxious with shortness of breath she is on work-up  Troponins were obtained which were elevated cardiology was consulted she was found to have AKI on admission in a setting of probable CHF exacerbation given recent fluid resuscitation and was admitted   Assessment & Plan:   Active Problems:   Acute decompensated heart failure (HCC)   Probable decompensated heart failure 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 fib with RVR I have resumed metoprolol 25 mg twice daily even though she may have an acute exacerbation of heart failure \ She has nonobstructive disease on last cath at [facility] but will probably need evaluation by cardiology this admission I have considered PE in my differential but do not think that this is in keeping with her symptoms and findings  Atrial fibrillation on Coumadin Resume metoprolol as above as need to rate control Will need pharmacy to dose Coumadin her home dose is usually 4 mg-she is refusing labs She is not a candidate for DOAC as she has had large GI bleed in the past and is at risk of falls and it was felt by cardiology it seems like that she was a better candidate for Coumadin  AKI CKD stage III Baseline creatinine prior to hospitalization 10/18 was less than 1.5 -Await labs from this morning  Poorly controlled hyperglycemia diabetes mellitus type 2 with recent admission this month for DKA Resumed home meds of Basaglar 50 units twice daily in addition to lispro insulin 16-18 3 times daily meals Adding moderate sliding scale in addition  OSA on CPAP at home Follows with pulmonology in the outpatient setting we will order CPAP on admission at night with home settings  Chronic respiratory failure combination of COPD and CHF on 3 L of oxygen During the day will require 3 L of oxygen Will need desat screen and evaluation by home health  Prior GI bleed in the past Prior history of bleeding in the past monitor trends okay for Coumadin at this time  DVT prophylaxis: On Coumadin Code Status: Full CODE STATUS Family Communication:  Disposition:   Status is: Inpatient  Remains inpatient appropriate because:Persistent severe electrolyte disturbances, Ongoing active pain requiring inpatient pain management and  Ongoing diagnostic testing needed not appropriate for outpatient work up   Dispo: The patient is from: Home              Anticipated d/c is to: We will have her evaluated for skilled but may refuse and may want to go home              Anticipated d/c date is: > 3 days              Patient currently is not medically stable  to d/c.       Consultants:   Cardiology  Procedures: None  Antimicrobials: None   Subjective: Patient has a litany of complaints and perseverates on them although is generally redirectable Admits that breathing is a little better slept a little better with laying some way halfway up in bed but needed to use her CPAP machine No chest pain  Objective: Vitals:   03/24/20 0424 03/24/20 0706 03/24/20 0819 03/24/20 0923  BP:    (!) 146/101  Pulse: 73   (!) 105  Resp:    20  Temp:      TempSrc:      SpO2: 100%  99% 100%  Weight:  76.2 kg    Height:        Intake/Output Summary (Last 24 hours) at 03/24/2020 1048 Last data filed at 03/24/2020 9702 Gross per 24 hour  Intake 594 ml  Output 150 ml  Net 444 ml   Filed Weights   03/24/20 0706  Weight: 76.2 kg    Examination:  General exam: EOMI NCAT no focal deficit cannot appreciate JVD Respiratory system: Clear no added sound rales rhonchi Cardiovascular system: S1-S2 A. fib 115 range on monitors irregularly irregular Gastrointestinal system: Soft nontender no rebound no guarding  Central nervous system: neurologically intact no focal deficit  Extremities: lower extremity edema grade 2. Skin: Lower extremity edema Psychiatry: Euthymic  Data Reviewed: I have personally reviewed following labs and imaging studies  Patient refused labs today  Radiology Studies: DG Chest Port 1 View  Result Date: 03/23/2020 CLINICAL DATA:  Shortness of breath EXAM: PORTABLE CHEST 1 VIEW COMPARISON:  March 20, 2020 FINDINGS: Lungs are mildly hyperexpanded. There is bibasilar atelectasis. There is no edema or airspace opacity. There is cardiomegaly with pulmonary vascularity within normal limits, stable. No adenopathy. Postoperative change in cervical spine. There is degenerative change in each shoulder. IMPRESSION: Lungs hyperexpanded, stable. Bibasilar atelectasis. No edema or airspace opacity. Stable cardiomegaly. Electronically  Signed   By: Lowella Grip III M.D.   On: 03/23/2020 07:54     Scheduled Meds: . allopurinol  100 mg Oral Daily  . budesonide  0.5 mg Nebulization BID  . escitalopram  10 mg Oral QHS  . ferrous sulfate  325 mg Oral Q breakfast  . influenza vaccine adjuvanted  0.5 mL Intramuscular Tomorrow-1000  . insulin aspart  16 Units Subcutaneous TID AC  . insulin glargine  50 Units Subcutaneous BID  . metoprolol succinate  25 mg Oral BID  . mirtazapine  15 mg Oral QHS  . modafinil  200 mg Oral Daily  . montelukast  10 mg Oral QHS  . nitroGLYCERIN  1 inch Topical Q6H  . pantoprazole  40 mg Oral Daily  . primidone  50 mg Oral QHS  . sodium chloride flush  3 mL Intravenous Q12H  . Warfarin - Pharmacist Dosing Inpatient   Does not apply q1600   Continuous Infusions: . sodium chloride  LOS: 1 day    Time spent: [place], MD [facility] To contact the attending provider between 7A-7P or the covering provider during after hours 7P-7A, please log into the web site [email or web address] and access using universal [place] password for that web site. If you do not have the password, please call the hospital operator.  03/24/2020, 10:48 AM

## 2020-03-24 NOTE — Progress Notes (Addendum)
Inpatient Diabetes Program Recommendations  AACE/ADA: New Consensus Statement on Inpatient Glycemic Control (2015)  Target Ranges:  Prepandial:   less than 140 mg/dL      Peak postprandial:   less than 180 mg/dL (1-2 hours)      Critically ill patients:  140 - 180 mg/dL   Lab Results  Component Value Date   GLUCAP 257 (H) 03/24/2020   HGBA1C 13.5 (H) 03/20/2020    Review of Glycemic Control Results for Denise Freeman, Denise Freeman (MRN [id number]) as of 03/24/2020 09:50  Ref. Range 03/22/2020 07:47 03/23/2020 17:41 03/23/2020 20:42 03/24/2020 09:01  Glucose-Capillary Latest Ref Range: 70 - 99 mg/dL 262 (H) 276 (H) 253 (H) 257 (H)   Diabetes history: Type 2 DM Outpatient Diabetes medications: Basaglar 40 units BID, Humalog 16-18 units TID Current orders for Inpatient glycemic control: Lantus 50 units BID, Novolog 16 units TID  Inpatient Diabetes Program Recommendations:    Consider adding Novolog 0-20 units TID & HS.  Addendum: Spoke with patient regarding outpatient diabetes management. Very difficult for patient to remain engaged into diabetes conversation. Expressed anxiety and feelings of being overwhelmed. Verified home doses and became frustrated when asked if doses were missed.  Reviewed patient's current A1c of 13.5%. Explained what a A1c is and what it measures. Also reviewed goal A1c with patient, importance of good glucose control @ home, and blood sugar goals. Reviewed importance of good glycemic control, impact of steroids, role of pancreas, vascular changes and comorbidities. Patient blames her result on recent stress, despite education. Did review the possible need for dose adjustments. Patient is willing to adjust.  Patient needs glucose monitor. Recently received one, however reports "it gives me wrong readings".  Patient requests ONLY Freestyle Libre. PCP has filled out paper work. Blood glucose meter (includes strips and lancets) #[id number]. Reviewed recommended CBG frequency and  when to call MD. 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 Patient is not interested.  Patient strongly denies drinking sugary beverages and states, "I just hardly eat anything, I get choked all the time." Patient is planning to follow up with PCP in the next few weeks. Has no questions at this time. Will place [place] consult to continue to follow up with patient once discharged.  Thanks, Bronson Curb, MSN, RNC-OB Diabetes Coordinator [phone] (8a-5p)

## 2020-03-24 NOTE — Progress Notes (Signed)
Hypoglycemic Event  CBG: 69  Treatment: 4 oz juice/soda  Symptoms: Shaky,anxious, red faced, hot flash  Follow-up CBG: Time: CBG Result:  Possible Reasons for Event: Unknown  Comments/MD notified:Text-paged Dr. Verlon Au to notify.

## 2020-03-24 NOTE — Consult Note (Addendum)
Cardiology Consultation:   Patient ID: Denise Freeman; [id number]; 16-Apr-1951   Admit date: 03/23/2020 Date of Consult: 03/24/2020  Primary Care Provider: [name], NP Primary Cardiologist: Pixie Casino, MD 04/08/2018 Primary Electrophysiologist:  None   Patient Profile:   Denise Freeman is a 68 y.o. female with a hx of afib (no coumadin 2nd GIB), S-D-CHF w/ last EF 50-55% 04/2019, NICM w/ non-obs dz cath 2015, DM, HLD, COPD, chronic resp failure on home O2, GERD, fibromyalgia, OSA on BiPAP, who is being seen today for the evaluation of CHF exacerbation at the request of Dr Verlon Au.  History of Present Illness:   Denise Freeman was admitted 10/18-10/20/2021 for hyperosmolar nonketotic hyperglycemia w/ CBG 740, AKI on CKD III w/ Cr 1.7 at d/c. D/c'd off diuretics, pt was to resume them 10/23.   Pt admitted 10/21 with SOB, CHF exacerbation, cards asked to see.  Denise Freeman is chronically short of breath.  She is on 3 L chronically.  She walks with a walker.  She walks around the house some and on a good day will be able to walk into the store and ride around in the car.  However, it is also normal for her to have trouble getting to the car and not be able to walk into the store.  Her home dose of Lasix is 80 mg 3 times daily.  She says her last weight at home was 170 pounds, but is not sure of the date.  After her recent hospitalization, she went home.  She had been short of breath before she left, and the shortness of breath got worse.  She came back to the hospital less than 24 hours after she left.  She did not take any medicines after she went home.  Per the patient, EMS increased her oxygen to 6 L and she felt better.  Currently, she is back to her home dose of 3 L.  Although she has not ambulated much, she is able to carry on a conversation without having to stop for breath very often.  She had chest discomfort last night after getting choked on the peach.  Once her airway was  cleared, the pain gradually eased off, but lasted a while.  The pain was in the left side of her chest and radiated around to her back and into her shoulder.  She feels the atrial fibrillation at times, will feel her heart skip, or stop, or beat very hard.   Past Medical History:  Diagnosis Date  . Allergy   . Anemia   . Anxiety   . Asthma   . Atrial fibrillation ([place])   . Bipolar 1 disorder ([place])   . 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 Cholelithiasis    a. 09/2016 s/p Lap Chole.  . Chronic bronchitis ([place])   . Chronic combined systolic and diastolic congestive heart failure ([place])    a. 09/2016 Echo: EF 30-35%.  . Colon polyps   . COPD (chronic obstructive pulmonary disease) ([place])   . Depression   . Family history of adverse reaction to anesthesia    Uncle was positive for malignant hyperthermia; patient had testing done and was negative.  . Fibromyalgia   . GERD (gastroesophageal reflux disease)   . Gout   . High cholesterol   . History of blood transfusion 06/2015   "bleeding from my rectum"  . History of hiatal hernia   . HOH (hard of hearing)   .  Hx of colonic polyps 03/21/2016   3 small adenomas no recall - co-morbidities  . Neuropathy    Disc Back   . NICM (nonischemic cardiomyopathy) ([place])    a. Previously worked up in [place], MD-->low EF with subsequent recovery.  Multiple caths (last ~ 2014 per pt report)--reportedly nl cors;  b. 09/2016 Echo: EF 30-35%, antsept/apical HK, mild MR, mildly dil LA, mod dil RA;  c. 09/2016 Lexi MV: EF 26%, glob HK, sept DK, med size, mod intensity fixed septal defect - BBB/PVC related artifact, no ischemia.  . On home oxygen therapy    "3L; 24/7" (03/14/2016)  . OSA treated with BiPAP    uses biPAP, 10 (03/14/2016)  . Osteoarthritis   . Oxygen deficiency   . Pneumonia   . Type II diabetes mellitus ([place])     Past Surgical History:  Procedure Laterality Date  . APPENDECTOMY     "they busted"  . bladder stimulator      pt states, "it cannot be turned off; it's in my right hip; dead battery so it's not working anymore". (03/14/2016)  . BLADDER SUSPENSION     2003, 2006 and 2010  . CATARACT EXTRACTION W/PHACO Right 11/29/2014   Procedure: CATARACT EXTRACTION PHACO AND INTRAOCULAR LENS PLACEMENT (IOC);  Surgeon: Rutherford Guys, MD;  Location: [facility];  Service: Ophthalmology;  Laterality: Right;  CDE:3.81  . CATARACT EXTRACTION W/PHACO Left 12/13/2014   Procedure: CATARACT EXTRACTION PHACO AND INTRAOCULAR LENS PLACEMENT (IOC);  Surgeon: Rutherford Guys, MD;  Location: [facility];  Service: Ophthalmology;  Laterality: Left;  CDE:6.59  . CERVICAL DISC SURGERY N/A 2009   4, 6, and 7 cervical disc replaced  . CHOLECYSTECTOMY N/A 09/13/2016   Procedure: LAPAROSCOPIC CHOLECYSTECTOMY;  Surgeon: Rolm Bookbinder, MD;  Location: [facility];  Service: General;  Laterality: N/A;  . COLONOSCOPY WITH PROPOFOL N/A 03/15/2016   Procedure: COLONOSCOPY WITH PROPOFOL;  Surgeon: Gatha Mayer, MD;  Location: [facility];  Service: Endoscopy;  Laterality: N/A;  . ESOPHAGOGASTRODUODENOSCOPY (EGD) WITH PROPOFOL N/A 03/15/2016   Procedure: ESOPHAGOGASTRODUODENOSCOPY (EGD) WITH PROPOFOL;  Surgeon: Gatha Mayer, MD;  Location: [facility];  Service: Endoscopy;  Laterality: N/A;  . HEEL SPUR SURGERY Bilateral   . HERNIA REPAIR    . I & D EXTREMITY Right 06/13/2015   Procedure: MINOR IRRIGATION AND DEBRIDEMENT EXTREMITY REMOVAL OF NAIL;  Surgeon: Daryll Brod, MD;  Location: [facility];  Service: Orthopedics;  Laterality: Right;  . TUBAL LIGATION    . UMBILICAL HERNIA REPAIR     w/mesh     Prior to Admission medications   Medication Sig Start Date End Date Taking? Authorizing Provider  albuterol (PROVENTIL) (2.5 MG/3ML) 0.083% nebulizer solution Take 3 mLs (2.5 mg total) by nebulization every 6 (six) hours as needed for wheezing or shortness of breath. 02/17/20   Chesley Mires, MD  allopurinol (ZYLOPRIM) 100 MG tablet TAKE 1 TABLET  BY MOUTH ONCE DAILY. Patient taking differently: Take 100 mg by mouth daily.  12/08/17   Lucila Maine C, DO  budesonide (PULMICORT) 0.5 MG/2ML nebulizer solution Take 2 mLs (0.5 mg total) by nebulization 2 (two) times daily. 02/17/20   Chesley Mires, MD  cetirizine (ZYRTEC) 10 MG tablet Take 1 tablet (10 mg total) by mouth at bedtime. 01/30/17   Rogue Bussing, MD  cholecalciferol (VITAMIN D) 400 units TABS tablet Take 1 tablet (400 Units total) by mouth 2 (two) times daily. 07/17/17   Rogue Bussing, MD  clobetasol cream (TEMOVATE) 0.05 %  Apply 1 application topically daily.  01/28/19   [provider]  colchicine 0.6 MG tablet Take 1 tablet (0.6 mg total) by mouth as needed. Patient taking differently: Take 0.6 mg by mouth as needed (gout).  01/25/19   Kathie Dike, MD  escitalopram (LEXAPRO) 10 MG tablet Take 1 tablet (10 mg total) by mouth at bedtime. 10/24/17   Norman Clay, MD  FEROSUL 325 (65 Fe) MG tablet Take 325 mg by mouth daily.  01/08/19   [provider]  fluticasone (FLONASE) 50 MCG/ACT nasal spray Place 1 spray into both nostrils 2 (two) times daily.    [provider]  furosemide (LASIX) 40 MG tablet Take 1 tablet (40 mg total) by mouth daily. 03/22/20   Barton Dubois, MD  Insulin Glargine [facility]) 100 UNIT/ML Inject 50 Units into the skin 2 (two) times daily. 03/22/20   Barton Dubois, MD  insulin lispro (HUMALOG) 100 UNIT/ML KiwkPen Inject 0.16-0.18 mLs (16-18 Units total) into the skin 3 (three) times daily before meals. 03/22/20   Barton Dubois, MD  Insulin Pen Needle 32G X 4 MM MISC Use to inject insulin 5 times daily 03/27/18   Philemon Kingdom, MD  ipratropium (ATROVENT) 0.02 % nebulizer solution Take 2.5 mLs (0.5 mg total) by nebulization every 6 (six) hours as needed for wheezing or shortness of breath. 02/17/20   Chesley Mires, MD  losartan (COZAAR) 25 MG tablet Take 0.5 tablets (12.5 mg total) by mouth daily. 03/25/20    Barton Dubois, MD  metolazone (ZAROXOLYN) 5 MG tablet Take 1 tablet (5 mg total) by mouth 3 (three) times a week. 03/24/20 06/22/20  Barton Dubois, MD  metoprolol succinate (TOPROL-XL) 25 MG 24 hr tablet TAKE 1 TABLET BY MOUTH TWICE DAILY Patient taking differently: Take 25 mg by mouth in the morning and at bedtime.  09/23/19   Hilty, Nadean Corwin, MD  mirtazapine (REMERON) 15 MG tablet Take 1 tablet (15 mg total) by mouth at bedtime. 10/24/17   Norman Clay, MD  Misc Natural Products (TART CHERRY ADVANCED PO) Take by mouth daily.    [provider]  modafinil (PROVIGIL) 200 MG tablet Take 1 tablet (200 mg total) by mouth daily. 02/17/20   Chesley Mires, MD  montelukast (SINGULAIR) 10 MG tablet Take 1 tablet (10 mg total) by mouth at bedtime. 02/17/20   Chesley Mires, MD  nitroGLYCERIN (NITROSTAT) 0.4 MG SL tablet Place 0.4 mg under the tongue every 5 (five) minutes as needed for chest pain.     [provider]  oxyCODONE-acetaminophen (PERCOCET/ROXICET) 5-325 MG tablet Take 1 tablet by mouth 2 (two) times daily as needed for severe pain. 11/27/17   Rogue Bussing, MD  pantoprazole (PROTONIX) 20 MG tablet TAKE (1) TABLET BY MOUTH TWICE A DAY BEFORE MEALS. (BREAKFAST AND SUPPER) Patient taking differently: Take 20 mg by mouth 2 (two) times daily before a meal.  12/02/17   Riccio, Gardiner Rhyme, DO  potassium chloride SA (KLOR-CON) 20 MEQ tablet TAKE 1 TABLET BY MOUTH TWICE DAILY Patient taking differently: Take 20 mEq by mouth 2 (two) times daily.  02/11/20   Hilty, Nadean Corwin, MD  primidone (MYSOLINE) 50 MG tablet TAKE ONE TABLET BY MOUTH AT BEDTIME. Patient taking differently: Take 50 mg by mouth at bedtime.  11/10/17   Rogue Bussing, MD  Probiotic Product (PROBIOTIC PO) Take 1 tablet by mouth at bedtime.    [provider]  TRUE METRIX BLOOD GLUCOSE TEST test strip USE TO TEST BLOOD SUGAR  AS DIRECTED. 09/24/17   Cassandria Anger, MD  Vitamin A 2400 MCG (8000 UT)  CAPS Take 8,000 Units by mouth every morning.     [provider]  Vitamin D, Ergocalciferol, (DRISDOL) 1.25 MG (50000 UNIT) CAPS capsule Take 1 capsule (50,000 Units total) by mouth every 7 (seven) days. 03/22/20   Barton Dubois, MD  warfarin (COUMADIN) 4 MG tablet Take 2 tablets tonight, 1 1/2 tablets tomorrow night then increase dose to 1 tablet daily Patient taking differently: Take 4 mg by mouth daily.  03/02/20   Hilty, Nadean Corwin, MD  potassium chloride 20 MEQ TBCR Take 20 mEq by mouth 2 (two) times daily. 05/02/19 07/02/19  Barton Dubois, MD    Inpatient Medications: Scheduled Meds: . allopurinol  100 mg Oral Daily  . budesonide  0.5 mg Nebulization BID  . escitalopram  10 mg Oral QHS  . ferrous sulfate  325 mg Oral Q breakfast  . influenza vaccine adjuvanted  0.5 mL Intramuscular Tomorrow-1000  . insulin aspart  16 Units Subcutaneous TID AC  . insulin glargine  50 Units Subcutaneous BID  . metoprolol succinate  25 mg Oral BID  . mirtazapine  15 mg Oral QHS  . modafinil  200 mg Oral Daily  . montelukast  10 mg Oral QHS  . nitroGLYCERIN  1 inch Topical Q6H  . pantoprazole  40 mg Oral Daily  . primidone  50 mg Oral QHS  . sodium chloride flush  3 mL Intravenous Q12H  . warfarin  4 mg Oral ONCE-1600  . Warfarin - Pharmacist Dosing Inpatient   Does not apply q1600   Continuous Infusions: . sodium chloride     PRN Meds: sodium chloride, acetaminophen, albuterol, colchicine, ipratropium, oxyCODONE-acetaminophen, sodium chloride flush  Allergies:    Allergies  Allergen Reactions  . Oseltamivir Hives    Other reaction(s): Other (see comments) "water blisters"  . Xarelto [Rivaroxaban] Other (See Comments)    Internal bleeding  . Ancef [Cefazolin] Nausea And Vomiting  . Levaquin [Levofloxacin In D5w] Other (See Comments)    "afib"  . Lyrica [Pregabalin] Hives  . Tamiflu [Oseltamivir Phosphate] Other (See Comments)    "water blisters"  . Zoloft [Sertraline Hcl]  Other (See Comments)    Jaw problems, jittery  . Torsemide Other (See Comments)    Not effective  . Augmentin [Amoxicillin-Pot Clavulanate] Itching  . Ciprofloxacin Itching and Nausea And Vomiting  . Haldol [Haloperidol] Other (See Comments)    Restless leg  . Nsaids Diarrhea  . Penicillins Itching, Nausea And Vomiting and Rash    Has patient had a PCN reaction causing immediate rash, facial/tongue/throat swelling, SOB or lightheadedness with hypotension: Yes Has patient had a PCN reaction causing severe rash involving mucus membranes or skin necrosis: No Has patient had a PCN reaction that required hospitalization No Has patient had a PCN reaction occurring within the last 10 years: Yes If all of the above answers are "NO", then may proceed with Cephalosporin use.  . Topamax [Topiramate] Nausea Only    Social History:   Social History   Socioeconomic History  . Marital status: Married    Spouse name: Shifra Swartzentruber   . Number of children: 1  . Years of education: 8 years  . Highest education level: 7th grade  Occupational History  . Occupation: house wife  Tobacco Use  . Smoking status: Former Smoker    Packs/day: 2.00    Years: 14.00    Pack years: 28.00  Types: Cigarettes    Quit date: 04/12/1989    Years since quitting: 30.9  . Smokeless tobacco: Never Used  Vaping Use  . Vaping Use: Never used  Substance and Sexual Activity  . Alcohol use: No    Alcohol/week: 0.0 standard drinks  . Drug use: No  . Sexual activity: Never  Other Topics Concern  . Not on file  Social History Narrative   Married   Disabled   1 child; 44 yrs   6 pregnancies   1 child   Social Determinants of Health   Financial Resource Strain: High Risk  . Difficulty of Paying Living Expenses: Very hard  Food Insecurity: No Food Insecurity  . Worried About Charity fundraiser in the Last Year: Never true  . Ran Out of Food in the Last Year: Never true  Transportation Needs: No  Transportation Needs  . Lack of Transportation (Medical): No  . Lack of Transportation (Non-Medical): No  Physical Activity: Inactive  . Days of Exercise per Week: 0 days  . Minutes of Exercise per Session: 0 min  Stress: No Stress Concern Present  . Feeling of Stress : Only a little  Social Connections: Socially Isolated  . Frequency of Communication with Friends and Family: Once a week  . Frequency of Social Gatherings with Friends and Family: Once a week  . Attends Religious Services: Never  . Active Member of Clubs or Organizations: No  . Attends Archivist Meetings: Never  . Marital Status: Married  Human resources officer Violence: Not At Risk  . Fear of Current or Ex-Partner: No  . Emotionally Abused: No  . Physically Abused: No  . Sexually Abused: No    Family History:   Family History  Problem Relation Age of Onset  . Heart disease Mother   . COPD Mother   . Diabetes Mother   . Breast cancer Mother   . Heart disease Father   . Hyperlipidemia Father   . COPD Sister   . Heart disease Sister   . Diabetes Sister   . Heart disease Maternal Grandmother   . Cancer Maternal Grandmother        stomach  . Cancer Maternal Grandfather        lung   . Bipolar disorder Brother    Family Status:  Family Status  Relation Name Status  . Mother  Deceased at age 36       heart disease, COPD, breast cancer  . Father  Deceased at age 42       heart disease  . Sister  Deceased       21  . MGM  Deceased at age 62  . MGF  Deceased at age 47  . PGM  Deceased  . PGF  Deceased  . Brother  (Not Specified)       substance use    ROS:  Please see the history of present illness.  All other ROS reviewed and negative.     Physical Exam/Data:   Vitals:   03/24/20 0424 03/24/20 0706 03/24/20 0819 03/24/20 0923  BP:    (!) 146/101  Pulse: 73   (!) 105  Resp:    20  Temp:      TempSrc:      SpO2: 100%  99% 100%  Weight:  76.2 kg    Height:        Intake/Output  Summary (Last 24 hours) at 03/24/2020 1123 Last data filed at 03/24/2020 2956 Gross  per 24 hour  Intake 357 ml  Output 150 ml  Net 207 ml    Last 3 Weights 03/24/2020 03/20/2020 02/17/2020  Weight (lbs) 168 lb 1.6 oz 170 lb 174 lb  Weight (kg) 76.25 kg 77.111 kg 78.926 kg  Some encounter information is confidential and restricted. Go to Review Flowsheets activity to see all data.     Body mass index is 33.95 kg/m.   General:  Well nourished, well developed, female in no acute distress HEENT: normal Lymph: no adenopathy Neck: JVD -mildly elevated Endocrine:  No thryomegaly Vascular: No carotid bruits; upper extremity pulses 2+  Cardiac:  normal S1, S2; irregular rate and rhythm; no murmur Lungs: Scattered dry rales bilaterally, no wheezing, rhonchi   Abd: soft, nontender, no hepatomegaly  Ext: Trace edema Musculoskeletal:  No deformities, BUE and BLE strength normal and equal Skin: warm and dry, multiple areas of ecchymosis, many from recent blood draws and IVs Neuro:  CNs 2-12 intact, no focal abnormalities noted Psych:  Normal affect   EKG:  The EKG was personally reviewed and demonstrates: Atrial fibrillation, heart rate 127, occasional PVCs, loss of R waves in precordial leads is old Telemetry:  Telemetry was personally reviewed and demonstrates: Atrial fibrillation, rapid at times, PVCs   CV studies:   ECHO: 1. Left ventricular ejection fraction, by visual estimation, is 50 to  55%. The left ventricle has low normal function. There is mildly increased  left ventricular hypertrophy.  2. Elevated left atrial pressure.  3. Left ventricular diastolic parameters are consistent with Grade II  diastolic dysfunction (pseudonormalization).  4. Global right ventricle has mildly reduced systolic function.The right  ventricular size is moderately enlarged. No increase in right ventricular  wall thickness.  5. Left atrial size was moderately dilated.  6. Right atrial size  was moderately dilated.  7. The mitral valve is normal in structure. No evidence of mitral valve  regurgitation. No evidence of mitral stenosis.  8. The tricuspid valve is normal in structure. Tricuspid valve  regurgitation is trivial.  9. The aortic valve has an indeterminant number of cusps. Aortic valve  regurgitation is not visualized. No evidence of aortic valve sclerosis or  stenosis.  10. The pulmonic valve was not well visualized. Pulmonic valve  regurgitation is mild.  11. Mildly elevated pulmonary artery systolic pressure.  12. The inferior vena cava is dilated in size with >50% respiratory  variability, suggesting right atrial pressure of 8 mmHg.   CATH: 02/15/2013, [facility] Minimal CAD   Laboratory Data:   ABG    Component Value Date/Time   HCO3 37.2 (H) 03/20/2020 1901   O2SAT 40.7 03/20/2020 1901    Chemistry Recent Labs  Lab 03/21/20 0703 03/22/20 0307 03/23/20 0730  NA 131* 134* 139  K 2.8* 4.2 3.9  CL 83* 85* 92*  CO2 34* 34* 33*  GLUCOSE 259* 335* 331*  BUN 116* 99* 69*  CREATININE 2.03* 1.71* 1.70*  CALCIUM 8.2* 8.7* 9.3  GFRNONAA 24* 30* 32*  ANIONGAP 14 15 14     Lab Results  Component Value Date   ALT 17 01/24/2019   AST 18 01/24/2019   ALKPHOS 92 01/24/2019   BILITOT 0.4 01/24/2019   Hematology Recent Labs  Lab 03/20/20 1604 03/23/20 0730  WBC 11.5* 11.5*  RBC 4.33 4.03  HGB 12.5 11.6*  HCT 36.2 36.4  MCV 83.6 90.3  MCH 28.9 28.8  MCHC 34.5 31.9  RDW 12.1 12.4  PLT 230 222  Cardiac Enzymes High Sensitivity Troponin:   Recent Labs  Lab 03/23/20 0730 03/23/20 0934  TROPONINIHS 159* 189*      BNP Recent Labs  Lab 03/23/20 0730  BNP 925.0*    DDimer No results for input(s): DDIMER in the last 168 hours. TSH:  Lab Results  Component Value Date   TSH 1.62 05/01/2017   Lipids: Lab Results  Component Value Date   CHOL 163 07/29/2017   HDL 40 07/29/2017   LDLCALC 86 07/29/2017   LDLDIRECT  153.0 04/19/2016   TRIG 183 (H) 07/29/2017   CHOLHDL 4.1 07/29/2017   HgbA1c: Lab Results  Component Value Date   HGBA1C 13.5 (H) 03/20/2020   Magnesium:  Magnesium  Date Value Ref Range Status  03/22/2020 1.6 (L) 1.7 - 2.4 mg/dL Final    Comment:    Performed at [facility], [address] [place], [place] [address]     Radiology/Studies:  CT Head Wo Contrast  Result Date: 03/20/2020 CLINICAL DATA:  Dizzy spells since COVID vaccine EXAM: CT HEAD WITHOUT CONTRAST TECHNIQUE: Contiguous axial images were obtained from the base of the skull through the vertex without intravenous contrast. COMPARISON:  CT brain 12/11/2015 FINDINGS: Brain: No acute territorial infarction, hemorrhage or intracranial mass. The ventricles are nonenlarged. Vascular: No hyperdense vessel or unexpected calcification. Skull: Normal. Negative for fracture or focal lesion. Sinuses/Orbits: No acute finding.  Frothy debris in the nasopharynx. Other: None IMPRESSION: Negative non contrasted CT appearance of the brain. Electronically Signed   By: Donavan Foil M.D.   On: 03/20/2020 16:37   DG Chest Port 1 View  Result Date: 03/23/2020 CLINICAL DATA:  Shortness of breath EXAM: PORTABLE CHEST 1 VIEW COMPARISON:  March 20, 2020 FINDINGS: Lungs are mildly hyperexpanded. There is bibasilar atelectasis. There is no edema or airspace opacity. There is cardiomegaly with pulmonary vascularity within normal limits, stable. No adenopathy. Postoperative change in cervical spine. There is degenerative change in each shoulder. IMPRESSION: Lungs hyperexpanded, stable. Bibasilar atelectasis. No edema or airspace opacity. Stable cardiomegaly. Electronically Signed   By: Lowella Grip III M.D.   On: 03/23/2020 07:54   DG CHEST PORT 1 VIEW  Result Date: 03/20/2020 CLINICAL DATA:  Fall, cough EXAM: PORTABLE CHEST 1 VIEW COMPARISON:  04/27/2019 FINDINGS: Surgical hardware in the cervical spine. Hyperinflated lungs. Cardiac  enlargement without overt edema or pleural effusion. No focal consolidation. IMPRESSION: No active disease. Mild cardiomegaly. Electronically Signed   By: Donavan Foil M.D.   On: 03/20/2020 20:04   DG Hip Unilat W or Wo Pelvis 2-3 Views Left  Result Date: 03/20/2020 CLINICAL DATA:  Fall EXAM: DG HIP (WITH OR WITHOUT PELVIS) 2-3V LEFT COMPARISON:  None. FINDINGS: There is no acute fracture or dislocation. Mild changes of osteoarthritis. Bladder stimulator is present. IMPRESSION: No acute fracture. Electronically Signed   By: Macy Mis M.D.   On: 03/20/2020 16:28    Assessment and Plan:   1.  CHF: -Her baseline respiratory status is poor. -Her BNP is elevated of her baseline -However, her neck veins are not up very much and chest x-ray does not show edema or airspace opacity -Lasix was held during recent hospitalization because of elevated creatinine, which is now back to baseline -According to the patient, her weight is at or below her base weight at home. -Discuss diuretics with MD.  She has said in the past that torsemide was not effective. -Because of the distance involved, they may prefer follow-up in [place].  Otherwise, per IM  Active Problems:   Acute decompensated heart failure ([place])     For questions or updates, please contact [facility] Please consult [email or web address] for contact info under Cardiology/STEMI.   Jonetta Speak, PA-C  03/24/2020 11:23 AM   Attending note  Patient seen and discussed with PA Barrett, I agree with her documentation. 69 yo female history of COPD on home O2, chronic combined systolic/diastolic HF, permanent afib, multiple caths last 2014 without significant disease, fibrmyalgia, DNR status. Admitted with SOB.  Cr 1.7 BUN 69 WBC 11.5 Hgb 11.5 Plt 222 BNP 925 INR 1.8  hstrop 159-->189 COVID neg CXR no acute process EKG afib low voltrage 04/2019 echo: LVEF 50-55%, no WMAs, grade II DDx, mild RV dysfunction   Elevated  troponin fairly mild in setting of HF, tachycardia. Multiple normal caths in the past last in 2014, with advanced comorbidities in general poor cath canditate and would not pursue in the abence of high risk findings which are not present. Volume status difficult to assess by exam due to body habitus, she got lasix 20mg  x 1 yesterday. Stable Cr this admit, up from her baseline, could be secondary to venous congestion. Dose IV lasix 60mg  x 1 now and follow response and labs, if venous congestion Cr may improve with diuresis, potentialyl redose tomorrwo pending response.   History of afib, prior issues with GI bleeding but has been on coumadin by primary cardiolgist without issues. Continue her outpatient anticoag regimen. HRs up and down, can titrate beta blocker as needed this admit.  Carlyle Dolly MD

## 2020-03-25 DIAGNOSIS — I509 Heart failure, unspecified: Secondary | ICD-10-CM | POA: Diagnosis not present

## 2020-03-25 LAB — BASIC METABOLIC PANEL
Anion gap: 14 (ref 5–15)
BUN: 68 mg/dL — ABNORMAL HIGH (ref 8–23)
CO2: 32 mmol/L (ref 22–32)
Calcium: 8.6 mg/dL — ABNORMAL LOW (ref 8.9–10.3)
Chloride: 91 mmol/L — ABNORMAL LOW (ref 98–111)
Creatinine, Ser: 2 mg/dL — ABNORMAL HIGH (ref 0.44–1.00)
GFR, Estimated: 27 mL/min — ABNORMAL LOW (ref >60)
Glucose, Bld: 138 mg/dL — ABNORMAL HIGH (ref 70–99)
Potassium: 3.9 mmol/L (ref 3.5–5.1)
Sodium: 137 mmol/L (ref 135–145)

## 2020-03-25 LAB — PROTIME-INR
INR: 2.5 — ABNORMAL HIGH (ref 0.8–1.2)
Prothrombin Time: 26.3 seconds — ABNORMAL HIGH (ref 11.4–15.2)

## 2020-03-25 LAB — GLUCOSE, CAPILLARY
Glucose-Capillary: 113 mg/dL — ABNORMAL HIGH (ref 70–99)
Glucose-Capillary: 116 mg/dL — ABNORMAL HIGH (ref 70–99)
Glucose-Capillary: 70 mg/dL (ref 70–99)
Glucose-Capillary: 154 mg/dL — ABNORMAL HIGH (ref 70–99)
Glucose-Capillary: 277 mg/dL — ABNORMAL HIGH (ref 70–99)

## 2020-03-25 MED ORDER — ADULT MULTIVITAMIN W/MINERALS CH
1.0000 | ORAL_TABLET | Freq: Every day | ORAL | Status: DC
  Administered 2020-03-25 – 2020-03-27 (×3): 1 via ORAL
  Filled 2020-03-25 (×3): qty 1

## 2020-03-25 MED ORDER — DEXTROSE 50 % IV SOLN
INTRAVENOUS | Status: AC
  Administered 2020-03-25: 50 mL
  Filled 2020-03-25: qty 50

## 2020-03-25 MED ORDER — INSULIN ASPART 100 UNIT/ML ~~LOC~~ SOLN
3.0000 [IU] | Freq: Three times a day (TID) | SUBCUTANEOUS | Status: DC
  Administered 2020-03-26 (×2): 3 [IU] via SUBCUTANEOUS

## 2020-03-25 MED ORDER — INSULIN GLARGINE 100 UNIT/ML ~~LOC~~ SOLN
22.0000 [IU] | Freq: Two times a day (BID) | SUBCUTANEOUS | Status: DC
  Administered 2020-03-25 – 2020-03-27 (×4): 22 [IU] via SUBCUTANEOUS
  Filled 2020-03-25 (×6): qty 0.22

## 2020-03-25 MED ORDER — GLUCERNA SHAKE PO LIQD
237.0000 mL | Freq: Three times a day (TID) | ORAL | Status: DC
  Administered 2020-03-27: 237 mL via ORAL

## 2020-03-25 MED ORDER — FUROSEMIDE 10 MG/ML IJ SOLN
60.0000 mg | Freq: Once | INTRAMUSCULAR | Status: AC
Start: 2020-03-25 — End: 2020-03-25
  Administered 2020-03-25: 60 mg via INTRAVENOUS
  Filled 2020-03-25: qty 6

## 2020-03-25 MED ORDER — WARFARIN SODIUM 1 MG PO TABS
4.0000 mg | ORAL_TABLET | Freq: Once | ORAL | Status: AC
  Administered 2020-03-25: 4 mg via ORAL
  Filled 2020-03-25: qty 4

## 2020-03-25 NOTE — Progress Notes (Signed)
Initial Nutrition Assessment  DOCUMENTATION CODES:   Obesity unspecified  INTERVENTION:  Glucerna Shake po TID, each supplement provides 220 kcal and 10 grams of protein  MVI with minerals daily   NUTRITION DIAGNOSIS:   Increased nutrient needs related to acute illness, chronic illness (acute CHF exacerbation; chronic respiratory failure secondary to COPD and CHF) as evidenced by estimated needs.  GOAL:   Patient will meet greater than or equal to 90% of their needs  MONITOR:   PO intake, Supplement acceptance, Labs, I & O's, Weight trends  REASON FOR ASSESSMENT:   Malnutrition Screening Tool    ASSESSMENT: 69 year old female with history of DM2, afib, chronic respiratory failure on 3 L oxygen at baseline, COPD, CHF, OSA on CPAP, chronic bronchitis, CKD stage III, nonischemic cardiomyopathy, GERD, gout, fibromyalgia, who was recently admitted to short stay on 10/18 for DKA and left AMA on 10/20 presents with 3-4 days of shortness of breath. Pt admitted with CHF exacerbation.  RD working remotely.  Attempted to reach pt via phone, however no answer. Unable to obtain nutrition history at this time. Diet advanced to soft this morning, no documented intakes for review. Suspect decreased oral intake given reported shortness of breath, will order Glucerna Shake TID to aid with meeting needs.  Per chart, weights have trended down ~34 lbs (16.8%) from usual weight; significant. Given trends as well as chronic co-morbidities, highly suspect degree of malnutrition however unable to identify at this time. Will plan to complete exam at follow-up.  Medications reviewed and include: Ferrous sulfate, SSI, Lantus 22 units twice daily, Remeron, Protonix, Warfarin  Labs: CBGs 154,70,116,113, BUN 68 (H), Cr 2.0 (H), BNP 925 (H)   NUTRITION - FOCUSED PHYSICAL EXAM: Unable to complete at this time, RD working remotely.  Diet Order:   Diet Order            DIET SOFT Room service  appropriate? Yes; Fluid consistency: Thin  Diet effective now                 EDUCATION NEEDS:   No education needs have been identified at this time  Skin:  Skin Assessment: Reviewed RN Assessment  Last BM:  10/22  Height:   Ht Readings from Last 1 Encounters:  03/23/20 4\' 11"  (1.499 m)    Weight:   Wt Readings from Last 1 Encounters:  03/24/20 76.2 kg    BMI:  Body mass index is 33.95 kg/m.  Estimated Nutritional Needs:   Kcal:  1600-1800  Protein:  60-70  Fluid:  >/= 1.6 L/day   Lajuan Lines, RD, LDN Clinical Nutrition After Hours/Weekend Pager # in [phone]

## 2020-03-25 NOTE — Progress Notes (Signed)
PROGRESS NOTE    Denise Freeman  JIR:[id number] DOB: August 02, 1950 DOA: 03/23/2020 PCP: [name], NP  Brief Narrative:  69 year old DM TY 2 with poor control Atrial fibrillation CHADS2 score >4 on Coumadin (previously on Xarelto which was stopped secondary to risk of GI bleed and vision loss) Chronic respiratory failure secondary to COPD/CHF on 3 L of oxygen OSA on CPAP followed by Dr. Halford Chessman of pulmonology CKD stage III/IIIb Prior melena with about 20 polyps found on scope 2017 by Dr. Arelia Longest Baseline weight around 91 kg in the past Nonischemic cardiomyopathy with multiple caths in the past demonstrating nonobstructive disease-EF 50-55% 04/29/2019 with pseudonormalization--- last cardiac cath sometime in 2015 BMI >34  Recent admission 10/18 through 03/23/2019 2021 for DKA and was volume resuscitated at that time but left AMA and did not obtain medications  Tells me on admission difficulty laying flat with with shortness of breath and was very anxious with shortness of breath she is on work-up  Troponins were obtained which were elevated cardiology was consulted she was found to have AKI on admission in a setting of probable CHF exacerbation given recent fluid resuscitation and was admitted   Assessment & Plan:   Active Problems:   Acute decompensated heart failure (HCC)   Probable decompensated heart failure 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 with creatinine and symptoms  Elevated troponin Etiology 2/2 heart failure/A. fib I have resumed metoprolol 25 mg twice daily even though she may have an  acute exacerbation of heart failure \ She has nonobstructive disease on last cath at [facility] but will probably need evaluation by cardiology this admission Unlikely to be PE  Atrial fibrillation on Coumadin Resume metoprolol as above as need to rate control Continue Coumadin INR is therapeutic She is not a candidate for DOAC as she has had large GI bleed in the past and an intracranial bleed and felt safer with Coumadin  AKI CKD stage III Baseline creatinine prior to hospitalization 10/18 was less than 1.5 Creatinine slightly trending up monitor trends in a.m.  Poorly controlled hyperglycemia diabetes mellitus type 2 with recent admission this month for DKA Poorly controlled hyperglycemia with complication of hypoglycemia Blood sugars improved with lower dose of Lantus 35 and aspart 6 units May need lower doses on discharge home  OSA on CPAP at home Follows with pulmonology in the outpatient setting we will order CPAP on admission at night with home settings  Chronic respiratory failure combination of COPD and CHF on 3 L of oxygen During the day will require 3 L of oxygen Will need desat screen and evaluation by home health  Prior GI bleed in the past Prior history of bleeding in the past monitor trends okay for Coumadin at this time  DVT prophylaxis: On Coumadin Code Status: Full CODE STATUS Family Communication:  Disposition:   Status is: Inpatient  Remains inpatient appropriate because:Persistent severe electrolyte disturbances, Ongoing active pain requiring inpatient pain management and Ongoing diagnostic testing needed not appropriate for outpatient work up   Dispo: The patient is from: Home  Anticipated d/c is to: We will have her evaluated for skilled but may refuse and may want to go home              Anticipated d/c date is: > 3 days              Patient currently is not medically stable to d/c.       Consultants:    Cardiology  Procedures: None  Antimicrobials: None   Subjective: Overall doing better No chest pain no fever No nausea no vomiting Able to lie flat to some degree and improved from prior  Objective: Vitals:   03/25/20 0040 03/25/20 0553 03/25/20 0825 03/25/20 1430  BP: (!) 95/58 (!) 121/110  107/73  Pulse: 74 (!) 49  90  Resp:  19  18  Temp:  (!) 97.3 F (36.3 C)  98.3 F (36.8 C)  TempSrc:    Oral  SpO2:  100% 94% 100%  Weight:      Height:        Intake/Output Summary (Last 24 hours) at 03/25/2020 1550 Last data filed at 03/25/2020 1400 Gross per 24 hour  Intake 237 ml  Output 500 ml  Net -263 ml   Filed Weights   03/24/20 0706  Weight: 76.2 kg    Examination:  EOMI NCAT less upset more amenable Clinically clear chest without added sound S1-S2 no murmur on monitors A. fib rate controlled Abdomen soft no rebound no guarding Neurologic intact  Data Reviewed: I have personally reviewed following labs and imaging studies  BUN/creatinine 68/2.0 Potassium 3.9 INR 2.5  Radiology Studies: No results found.   Scheduled Meds: . allopurinol  100 mg Oral Daily  . budesonide  0.5 mg Nebulization BID  . escitalopram  10 mg Oral QHS  . ferrous sulfate  325 mg Oral Q breakfast  . influenza vaccine adjuvanted  0.5 mL Intramuscular Tomorrow-1000  . insulin aspart  6 Units Subcutaneous TID AC  . insulin glargine  35 Units Subcutaneous BID  . metoprolol succinate  25 mg Oral BID  . mirtazapine  15 mg Oral QHS  . modafinil  200 mg Oral Daily  . montelukast  10 mg Oral QHS  . pantoprazole  40 mg Oral Daily  . primidone  50 mg Oral QHS  . sodium chloride flush  3 mL Intravenous Q12H  . warfarin  4 mg Oral ONCE-1600  . Warfarin - Pharmacist Dosing Inpatient   Does not apply q1600   Continuous Infusions: . sodium chloride       LOS: 2 days    Time spent: 20  Nita Sells, MD [facility] To contact the attending provider between 7A-7P  or the covering provider during after hours 7P-7A, please log into the web site [email or web address] and access using universal [place] password for that web site. If you do not have the password, please call the hospital operator.  03/25/2020, 3:50 PM

## 2020-03-25 NOTE — Progress Notes (Signed)
ANTICOAGULATION CONSULT NOTE -  Pharmacy Consult for:   warfarin dosing Indication: atrial fibrillation   Height: 4\' 11"  (149.9 cm) Weight: 76.2 kg (168 lb 1.6 oz) IBW/kg (Calculated) : 43.2Vital Signs: Temp: 97.3 F (36.3 C) (10/23 0553) BP: 121/110 (10/23 0553) Pulse Rate: 49 (10/23 0553)  Labs: Recent Labs    03/23/20 0730 03/23/20 0934 03/24/20 1119 03/25/20 0709  HGB 11.6*  --   --   --   HCT 36.4  --   --   --   PLT 222  --   --   --   LABPROT 20.0*  --  21.2* 26.3*  INR 1.8*  --  1.9* 2.5*  CREATININE 1.70*  --  1.78* 2.00*  TROPONINIHS 159* 189*  --   --     Assessment: Pharmacy consulted to dose warfarin for this 69 yo diabetic female on chronic anti-coagulation with warfarin for atrial fibrillation.   She left AMA yesterday, but has returned today with SOB, but no chest pain despite elevated troponins.  Disease state: acute decompensated heart failure CBC:  H/H: 11.6/36.4       Plates: 230>222  Home dose: warfarin 4mg  daily    INR is therapeutic at 2.5, slight booster dose given 2 days ago. Will continue with home dose  Drug Interactions: primidone-->may induce hepatic metabolism of warfarin  Goal of Therapy:  INR 2-3 Monitor platelets by anticoagulation protocol: Yes    Plan:  Give warfarin  4mg  x1 dose today  Daily PT/INR Every other day CBC Monitor for signs and symptoms of bleeding.  Isac Sarna, BS [place], BCPS Clinical Pharmacist Pager [phone] 03/25/2020,9:49 AM

## 2020-03-25 NOTE — Progress Notes (Signed)
Blood glucose this afternonn was 70 and patient was symptomatic, with tremors.  Given dextrose injection, crackers and grape juice.  Glucose increased to 154.  Contacted Dr. Verlon Au and he reduced sliding scale and lantus.  Patient very upset and angry over glucose dropping. Did not give 3 units at supper time. Gave tomato soup brought by husband and nabs.

## 2020-03-26 DIAGNOSIS — I509 Heart failure, unspecified: Secondary | ICD-10-CM | POA: Diagnosis not present

## 2020-03-26 LAB — GLUCOSE, CAPILLARY
Glucose-Capillary: 129 mg/dL — ABNORMAL HIGH (ref 70–99)
Glucose-Capillary: 131 mg/dL — ABNORMAL HIGH (ref 70–99)
Glucose-Capillary: 173 mg/dL — ABNORMAL HIGH (ref 70–99)
Glucose-Capillary: 207 mg/dL — ABNORMAL HIGH (ref 70–99)
Glucose-Capillary: 106 mg/dL — ABNORMAL HIGH (ref 70–99)
Glucose-Capillary: 94 mg/dL (ref 70–99)
Glucose-Capillary: 214 mg/dL — ABNORMAL HIGH (ref 70–99)

## 2020-03-26 LAB — BASIC METABOLIC PANEL
Anion gap: 12 (ref 5–15)
BUN: 69 mg/dL — ABNORMAL HIGH (ref 8–23)
CO2: 31 mmol/L (ref 22–32)
Calcium: 8.1 mg/dL — ABNORMAL LOW (ref 8.9–10.3)
Chloride: 88 mmol/L — ABNORMAL LOW (ref 98–111)
Creatinine, Ser: 2.07 mg/dL — ABNORMAL HIGH (ref 0.44–1.00)
GFR, Estimated: 25 mL/min — ABNORMAL LOW (ref >60)
Glucose, Bld: 270 mg/dL — ABNORMAL HIGH (ref 70–99)
Potassium: 4 mmol/L (ref 3.5–5.1)
Sodium: 131 mmol/L — ABNORMAL LOW (ref 135–145)

## 2020-03-26 LAB — PROTIME-INR
INR: 2.5 — ABNORMAL HIGH (ref 0.8–1.2)
Prothrombin Time: 26.4 seconds — ABNORMAL HIGH (ref 11.4–15.2)

## 2020-03-26 MED ORDER — INSULIN ASPART 100 UNIT/ML ~~LOC~~ SOLN
2.0000 [IU] | Freq: Three times a day (TID) | SUBCUTANEOUS | Status: DC
  Administered 2020-03-27: 2 [IU] via SUBCUTANEOUS

## 2020-03-26 MED ORDER — WARFARIN SODIUM 1 MG PO TABS
4.0000 mg | ORAL_TABLET | Freq: Once | ORAL | Status: AC
  Administered 2020-03-26: 4 mg via ORAL
  Filled 2020-03-26: qty 4

## 2020-03-26 MED ORDER — FUROSEMIDE 40 MG PO TABS
40.0000 mg | ORAL_TABLET | Freq: Two times a day (BID) | ORAL | Status: DC
  Administered 2020-03-26 – 2020-03-27 (×3): 40 mg via ORAL
  Filled 2020-03-26 (×3): qty 1

## 2020-03-26 NOTE — Progress Notes (Addendum)
Called into patient's room for being upset. When this nurse asked patient to explain what was wrong, patient continues to demand to speak with a doctor. Dr. Hanley Ben paged and patient was made aware he was paged. This nurse asked patient if she would like to take her medications and patient stated "I'm going to wait because I need to tell my husband what he needs to do." Patient also stated "I want to talk to my doctor from today. I don't care if he's asleep, get him up out of him bed. I want him here." This nurse attempted to explain that the doctors are different at night. This nurse asked patient what she would like to do and patient continues to say "I need to tell my husband an answer. He needs a verdict." This nurse told patient again that the page was sent to the doctor. Will continue to monitor.

## 2020-03-26 NOTE — Progress Notes (Signed)
ANTICOAGULATION CONSULT NOTE -  Pharmacy Consult for:   warfarin dosing Indication: atrial fibrillation   Height: 4\' 11"  (149.9 cm) Weight: 76.2 kg (168 lb 1.6 oz) IBW/kg (Calculated) : 43.2Vital Signs: Temp: 98.1 F (36.7 C) (10/24 0618) Temp Source: Oral (10/24 0618) BP: 107/53 (10/24 0618) Pulse Rate: 66 (10/24 0618)  Labs: Recent Labs    03/23/20 0934 03/24/20 1119 03/25/20 0709 03/26/20 0608  LABPROT  --  21.2* 26.3* 26.4*  INR  --  1.9* 2.5* 2.5*  CREATININE  --  1.78* 2.00* 2.07*  TROPONINIHS 189*  --   --   --     Assessment: Pharmacy consulted to dose warfarin for this 69 yo diabetic female on chronic anti-coagulation with warfarin for atrial fibrillation.   She left AMA yesterday, but has returned today with SOB, but no chest pain despite elevated troponins.  Disease state: acute decompensated heart failure CBC:  H/H: 11.6/36.4       Plates: 230>222  Home dose: warfarin 4mg  daily    INR is therapeutic at 2.5  Drug Interactions: primidone-->may induce hepatic metabolism of warfarin  Goal of Therapy:  INR 2-3 Monitor platelets by anticoagulation protocol: Yes    Plan:  Give warfarin  4mg  x1 dose today  Daily PT/INR Every other day CBC Monitor for signs and symptoms of bleeding.  Isac Sarna, BS Pharm D, BCPS Clinical Pharmacist Pager [phone] 03/26/2020,8:40 AM

## 2020-03-26 NOTE — Progress Notes (Signed)
PROGRESS NOTE    Denise Freeman  WNI:[id number] DOB: [birth year] DOA: 03/23/2020 PCP: [name], NP  Brief Narrative:  69 year old DM TY 2 with poor control Atrial fibrillation CHADS2 score >4 on Coumadin (previously on Xarelto which was stopped secondary to risk of GI bleed and vision loss) Chronic respiratory failure secondary to COPD/CHF on 3 L of oxygen OSA on CPAP followed by Dr. Halford Chessman of pulmonology CKD stage III/IIIb Prior melena with about 20 polyps found on scope 2017 by Dr. Arelia Longest Baseline weight around 91 kg in the past Nonischemic cardiomyopathy with multiple caths in the past demonstrating nonobstructive disease-EF 50-55% 04/29/2019 with pseudonormalization--- last cardiac cath sometime in 2015 BMI >34  Recent admission 10/18 through 03/23/2019 2021 for DKA and was volume resuscitated at that time but left AMA and did not obtain medications  Tells me on admission difficulty laying flat with with shortness of breath and was very anxious with shortness of breath she is on work-up  Troponins were obtained which were elevated cardiology was consulted she was found to have AKI on admission in a setting of probable CHF exacerbation given recent fluid resuscitation and was admitted   Assessment & Plan:   Active Problems:   Acute decompensated heart failure (HCC)   Probable decompensated heart failure 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 last echo EF 50-55% Elevated BNP likely points to this diagnosis she has more right-sided symptoms with lower extremity swelling Initially on Lasix 20 mg increased to Lasix 60 daily --- her home dosage is 40 mg by mouth daily but she tells me she takes 2 tablets every several hours--- she remains upset that her medications are not resumed at her home dosage, we have held her metolazone because of rising creatinine I have placed her on 40 twice daily cautiously and will monitor her renal function as below-difficult situation Weights and a note are  inaccurate Patient is less short of breath however still remains on oxygen  Elevated troponin Etiology 2/2 heart failure/A. fib Continue metoprolol 25 twice daily 1 She has nonobstructive disease on last cath at [facility] but will probably need evaluation by cardiology this admission Unlikely to be PE  Atrial fibrillation on Coumadin As above with regards to heart rate control Continue Coumadin INR is therapeutic She is not a candidate for DOAC as she has had large GI bleed in the past and an intracranial bleed and felt safer with Coumadin  AKI CKD stage III Baseline creatinine prior to hospitalization 10/18 was less than 1.5 Creatinine slightly trending up monitor trends in a.m.  Poorly controlled hyperglycemia diabetes mellitus type 2 with recent admission this month for DKA Patient is persistently unhappy with her blood sugar control and tells me that when it is below 150 she has hypo symptoms--I have adjusted the sliding scale to 3 units but hold insulin if below 200 Poorly controlled hyperglycemia with complication of hypoglycemia Continue lower dose of Lantus at 22 U  OSA on CPAP at home Follows with pulmonology in the outpatient setting we will order CPAP on admission at night with home settings  Chronic respiratory failure combination of COPD and CHF on 3 L of oxygen During the day will require 3 L of oxygen Will need desat screen and evaluation by home health  Prior GI bleed in the past Prior history of bleeding in the past monitor trends okay for Coumadin at this time  DVT prophylaxis: On Coumadin Code Status: Full CODE STATUS Family Communication: met with husband on 10/23 at the bedside Disposition:  Status is: Inpatient  Remains inpatient appropriate because:Persistent severe electrolyte disturbances, Ongoing active pain requiring inpatient pain management and Ongoing diagnostic testing needed not appropriate for outpatient work up   Dispo: The patient  is from: Home              Anticipated d/c is to: We will have her evaluated for skilled but may refuse and may want to go home              Anticipated d/c date is: > 3 days              Patient currently is not medically stable to d/c.       Consultants:   Cardiology  Procedures: None  Antimicrobials: None  Subjective:  Large litany of concerns, mainly centered around her sugars in the 150's and her symptoms Also non medical complaints--cold soup. Poor diet etc etc emotional; about these concerns and I spent a great deal of time today actively listening   Objective: Vitals:   03/25/20 1430 03/25/20 2033 03/25/20 2054 03/26/20 0618  BP: 107/73  114/90 (!) 107/53  Pulse: 90 88 89 66  Resp: '18 20 18 18  ' Temp: 98.3 F (36.8 C)  98.9 F (37.2 C) 98.1 F (36.7 C)  TempSrc: Oral  Oral Oral  SpO2: 100% 96% 100% 100%  Weight:      Height:        Intake/Output Summary (Last 24 hours) at 03/26/2020 1006 Last data filed at 03/26/2020 0400 Gross per 24 hour  Intake 237 ml  Output 600 ml  Net -363 ml   Filed Weights   03/24/20 0706  Weight: 76.2 kg    Examination:  EOMI NCAT fair no distress Clinically clear chest without added sound S1-S2 no murmur on monitors A. fib rate controlled Abdomen soft no rebound no guarding Neurologic intact  Data Reviewed: I have personally reviewed following labs and imaging studies  BUN/creatinine 69/2.0 Potassium 4.0 Sodium 131 INR 2.5  White count 11.5 Hemoglobin 11.6  Radiology Studies: No results found.   Scheduled Meds: . allopurinol  100 mg Oral Daily  . budesonide  0.5 mg Nebulization BID  . escitalopram  10 mg Oral QHS  . feeding supplement (GLUCERNA SHAKE)  237 mL Oral TID BM  . ferrous sulfate  325 mg Oral Q breakfast  . furosemide  40 mg Oral BID  . influenza vaccine adjuvanted  0.5 mL Intramuscular Tomorrow-1000  . insulin aspart  3 Units Subcutaneous TID AC  . insulin glargine  22 Units Subcutaneous  BID  . metoprolol succinate  25 mg Oral BID  . mirtazapine  15 mg Oral QHS  . modafinil  200 mg Oral Daily  . montelukast  10 mg Oral QHS  . multivitamin with minerals  1 tablet Oral Daily  . pantoprazole  40 mg Oral Daily  . primidone  50 mg Oral QHS  . sodium chloride flush  3 mL Intravenous Q12H  . warfarin  4 mg Oral ONCE-1600  . Warfarin - Pharmacist Dosing Inpatient   Does not apply q1600   Continuous Infusions: . sodium chloride       LOS: 3 days    Time spent: [place], MD [facility] To contact the attending provider between 7A-7P or the covering provider during after hours 7P-7A, please log into the web site [email or web address] and access using universal [place] password for that web site. If you do not have the password, please call  the hospital operator.  03/26/2020, 10:06 AM

## 2020-03-26 NOTE — Progress Notes (Signed)
0300 CBG was 129. When patient was informed of result, she stated "I need to get up and eat something or I'm going to have another episode." Patient states she has episodes when her glucose drops below 150. Patient removed CPAP and applied nasal cannula, sitting on side of bed eating crackers and drinking ginger ale. Will continue to monitor.

## 2020-03-26 NOTE — Progress Notes (Signed)
This nurse called into room by patient due to patient "having another attack." CBG checked and resulted at 131. Patient continues to tell staff her sugar cannot drop below 150. Patient reports headache and "feeling sick." This nurse attempted to explain normal CBG result and indications for insulin. Patient states "I know what you're saying but nobody knows my body like I do. I'd rather just die than feel like this." Ice cream given to patient by request. Patient sitting on the side of the bed eating with nasal cannula. Will continue to monitor.

## 2020-03-26 NOTE — Progress Notes (Signed)
Blood glucose 106 before supper.  Was asymptomatic.  Contacted Dr. Verlon Au and he ordered to reduce novolog to 2 units and hold for less than 200.  Verbal order placed due to Dr. Verlon Au driving and not able to put in order.

## 2020-03-27 ENCOUNTER — Encounter (HOSPITAL_COMMUNITY): Payer: Self-pay | Admitting: Family Medicine

## 2020-03-27 DIAGNOSIS — I509 Heart failure, unspecified: Secondary | ICD-10-CM | POA: Diagnosis not present

## 2020-03-27 LAB — BASIC METABOLIC PANEL
Anion gap: 14 (ref 5–15)
BUN: 68 mg/dL — ABNORMAL HIGH (ref 8–23)
CO2: 29 mmol/L (ref 22–32)
Calcium: 8.5 mg/dL — ABNORMAL LOW (ref 8.9–10.3)
Chloride: 89 mmol/L — ABNORMAL LOW (ref 98–111)
Creatinine, Ser: 1.99 mg/dL — ABNORMAL HIGH (ref 0.44–1.00)
GFR, Estimated: 27 mL/min — ABNORMAL LOW (ref >60)
Glucose, Bld: 171 mg/dL — ABNORMAL HIGH (ref 70–99)
Potassium: 4.6 mmol/L (ref 3.5–5.1)
Sodium: 132 mmol/L — ABNORMAL LOW (ref 135–145)

## 2020-03-27 LAB — PROTIME-INR
INR: 2.2 — ABNORMAL HIGH (ref 0.8–1.2)
Prothrombin Time: 23.8 seconds — ABNORMAL HIGH (ref 11.4–15.2)

## 2020-03-27 LAB — GLUCOSE, CAPILLARY: Glucose-Capillary: 207 mg/dL — ABNORMAL HIGH (ref 70–99)

## 2020-03-27 MED ORDER — FUROSEMIDE 40 MG PO TABS
40.0000 mg | ORAL_TABLET | Freq: Two times a day (BID) | ORAL | 0 refills | Status: DC
Start: 2020-03-27 — End: 2020-04-06

## 2020-03-27 MED ORDER — INSULIN LISPRO 100 UNIT/ML (KWIKPEN): 2.0000 [IU] | PEN_INJECTOR | Freq: Three times a day (TID) | SUBCUTANEOUS | 11 refills | Status: AC

## 2020-03-27 MED ORDER — BASAGLAR KWIKPEN 100 UNIT/ML ~~LOC~~ SOPN: 20.0000 [IU] | PEN_INJECTOR | Freq: Two times a day (BID) | SUBCUTANEOUS | Status: AC

## 2020-03-27 NOTE — Discharge Summary (Signed)
Physician Discharge Summary  Denise Freeman ZOX:[id number] DOB: 1951-01-24 DOA: 03/23/2020  PCP: [name], NP  Admit date: 03/23/2020 Discharge date: 03/27/2020  Time spent: 45 minutes  Recommendations for Outpatient Follow-up:  1. Recommend titration of psychiatric medications to help with anxiety given her severe anxiety affecting medical care during hospitalization 2. Needs transitional visit to cardiology in a week 3. Needs INR checked in 3 to 4 days, Chem-12 and CBC in 1 week 4. Recommend outpatient chest x-ray in 1 to 2 weeks 5. Continue oxygen 6. Note dosage changes with insulins, Lasix and discontinuation of losartan 7. Discharge weight about 76 kg which should be considered as patient's dry weight  Discharge Diagnoses:  Active Problems:   Acute decompensated heart failure [facility])   Discharge Condition: Guarded  Diet recommendation: Diabetic heart healthy  Filed Weights   03/24/20 0706  Weight: 76.2 kg    History of present illness:  69 year old DM TY 2 with poor control Atrial fibrillation CHADS2 score >4 on Coumadin (previously on Xareltowhich was stopped secondary to risk of GI bleed and vision loss) Chronic respiratory failure secondary to COPD/CHF on 3 L of oxygen OSA on CPAPfollowed by Dr. Halford Chessman of pulmonology CKD stage III/IIIb Prior melena with about 20 polyps found on scope 2017 by Dr. Arelia Longest Baseline weight around 75 kgin the past Nonischemic cardiomyopathy with multiple caths in the past demonstrating nonobstructive disease-EF 50-55% 04/29/2019 with pseudonormalization--- last cardiac cath sometime in 2015 BMI >34  Recent admission 10/18 through 03/23/2019 2021 for DKA and was volume resuscitated at that time but left AMA and did not obtain medications  Tells me on admission difficulty laying flat with with shortness of breath and was very anxious with shortness of breath she is on work-up  Troponins were obtained which were elevated  cardiology was consulted she was found to have AKI on admission in a setting of probable CHF exacerbation given recent fluid resuscitation and was admitted  Hospital Course:  Probable decompensated heart failure 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 fib Continue metoprolol 25 twice daily 1 She has nonobstructive disease on last cath at [facility] to be PE  Atrial fibrillation on Coumadin As above with regards to heart rate control INR was therapeutic this admission needs repeat in the outpatient setting She is not a candidate for DOAC as she has had large GI bleed in the past and an intracranial bleed and felt safer with Coumadin  AKI CKD stage III Baseline creatinine prior to hospitalization 10/18 was less than 1.5 Creatinine on discharge was 1.9 and will need to be monitored carefully in the setting of diuresis Will need to follow-up with nephrology and cardiology  Poorly controlled hyperglycemia diabetes mellitus type 2 with recent admission this month for DKA and brittle control Patient is persistently unhappy with her blood sugar control and tells me that when it is below 150 she has hypo symptoms--I have adjusted the sliding scale to 3 units but hold insulin if below 200 Poorly controlled hyperglycemia with complication  of hypoglycemia Continue lower dose of Lantus at 22 U  OSA on CPAP at home Follows with pulmonology in the outpatient setting we will order CPAP on admission at night with home settings  Chronic  respiratory failure combination of COPD and CHF on 3 L of oxygen During the day will require 3 L of oxygen Will need desat screen and evaluation by home health  Prior GI bleed in the past Prior history of bleeding in the past monitor trends okay for Coumadin at this time   Procedures: None Consultations:  Cardiology  Discharge Exam: Vitals:   03/27/20 0500 03/27/20 0828  BP: 122/87   Pulse: 84   Resp: 20   Temp: 98.4 F (36.9 C)   SpO2: 95% 97%    General: Awake coherent pleasant no distress EOMI NCAT no focal deficit thick neck Persisting very unhappy talks over  physician and does not allow much interaction Her main complaints today stem from hospitalizations from past 6 years she relates unhappiness about not being given enough to drink and cannot get diet She consistently interrupts making it difficult for me to give her discharge instructions however admits that she feels somewhat better than she did when she came in Cardiovascular: S1-S2 no murmur no rub atrial flutter/flutter on monitor Respiratory: Clear with no rales no rhonchi Mild lower extremity edema no abdominal pain abdomen soft nontender  Discharge Instructions   Discharge Instructions    Diet - low sodium heart healthy   Complete by: As directed    Discharge instructions   Complete by: As directed    Please read carefully at the dosages of your diabetes medications as the dosages have changed given your low blood sugars and please make sure that you take enough calories and and native carbohydrates You will need twice a day insulin you will not need any more metolazone as your kidney function is declining and you will need to follow-up with cardiology and nephrology in the outpatient setting in 1 to 2 weeks and repeat labs Because of your worsening kidney function I have discontinued your losartan and you will need to have follow-up in the outpatient setting with regards to reinitiation of this versus  not Also you will need your Coumadin level checked   Increase activity slowly   Complete by: As directed      Allergies as of 03/27/2020      Reactions   Oseltamivir Hives   Other reaction(s): Other (see comments) "water blisters"   Xarelto [rivaroxaban] Other (See Comments)   Internal bleeding   Ancef [cefazolin] Nausea And Vomiting   Levaquin [levofloxacin In D5w] Other (See Comments)   "afib"   Lyrica [pregabalin] Hives   Tamiflu [oseltamivir Phosphate] Other (See Comments)   "water blisters"   Zoloft [sertraline Hcl] Other (See Comments)   Jaw problems, jittery   Torsemide Other (See Comments)   Not effective   Augmentin [amoxicillin-pot Clavulanate] Itching   Ciprofloxacin Itching, Nausea And Vomiting   Haldol [haloperidol] Other (See Comments)   Restless leg   Nsaids Diarrhea   Penicillins Itching, Nausea And Vomiting, Rash   Has patient had a PCN reaction causing immediate rash, facial/tongue/throat swelling, SOB or lightheadedness with hypotension: Yes Has patient had a PCN reaction causing severe rash involving mucus membranes or skin necrosis: No Has patient had a PCN reaction that required hospitalization No Has patient had a PCN reaction occurring within the last 10 years: Yes If all of the above answers are "NO", then may  proceed with Cephalosporin use.   Topamax [topiramate] Nausea Only      Medication List    STOP taking these medications   cetirizine 10 MG tablet Commonly known as: ZYRTEC   Insulin Pen Needle 32G X 4 MM Misc   losartan 25 MG tablet Commonly known as: COZAAR   metolazone 5 MG tablet Commonly known as: ZAROXOLYN   potassium chloride SA 20 MEQ tablet Commonly known as: KLOR-CON   TART CHERRY ADVANCED PO     TAKE these medications   albuterol (2.5 MG/3ML) 0.083% nebulizer solution Commonly known as: PROVENTIL Take 3 mLs (2.5 mg total) by nebulization every 6 (six) hours as needed for wheezing or shortness of breath.    allopurinol 100 MG tablet Commonly known as: ZYLOPRIM TAKE 1 TABLET BY MOUTH ONCE DAILY.   Basaglar KwikPen 100 UNIT/ML Inject 20 Units into the skin 2 (two) times daily. What changed: how much to take   budesonide 0.5 MG/2ML nebulizer solution Commonly known as: PULMICORT Take 2 mLs (0.5 mg total) by nebulization 2 (two) times daily.   cholecalciferol 10 MCG (400 UNIT) Tabs tablet Commonly known as: VITAMIN D3 Take 1 tablet (400 Units total) by mouth 2 (two) times daily.   clobetasol cream 0.05 % Commonly known as: TEMOVATE Apply 1 application topically daily.   colchicine 0.6 MG tablet Take 1 tablet (0.6 mg total) by mouth as needed. What changed: reasons to take this   escitalopram 10 MG tablet Commonly known as: LEXAPRO Take 1 tablet (10 mg total) by mouth at bedtime.   FeroSul 325 (65 FE) MG tablet Generic drug: ferrous sulfate Take 325 mg by mouth daily.   fluticasone 50 MCG/ACT nasal spray Commonly known as: FLONASE Place 1 spray into both nostrils 2 (two) times daily.   furosemide 40 MG tablet Commonly known as: LASIX Take 1 tablet (40 mg total) by mouth 2 (two) times daily. What changed: when to take this   insulin lispro 100 UNIT/ML KiwkPen Commonly known as: HUMALOG Inject 0.02 mLs (2 Units total) into the skin 3 (three) times daily before meals. What changed: how much to take   ipratropium 0.02 % nebulizer solution Commonly known as: ATROVENT Take 2.5 mLs (0.5 mg total) by nebulization every 6 (six) hours as needed for wheezing or shortness of breath.   metoprolol succinate 25 MG 24 hr tablet Commonly known as: TOPROL-XL TAKE 1 TABLET BY MOUTH TWICE DAILY What changed: when to take this   mirtazapine 15 MG tablet Commonly known as: REMERON Take 1 tablet (15 mg total) by mouth at bedtime.   modafinil 200 MG tablet Commonly known as: PROVIGIL Take 1 tablet (200 mg total) by mouth daily.   montelukast 10 MG tablet Commonly known as:  SINGULAIR Take 1 tablet (10 mg total) by mouth at bedtime.   nitroGLYCERIN 0.4 MG SL tablet Commonly known as: NITROSTAT Place 0.4 mg under the tongue every 5 (five) minutes as needed for chest pain.   oxyCODONE-acetaminophen 5-325 MG tablet Commonly known as: PERCOCET/ROXICET Take 1 tablet by mouth 2 (two) times daily as needed for severe pain.   pantoprazole 20 MG tablet Commonly known as: PROTONIX TAKE (1) TABLET BY MOUTH TWICE A DAY BEFORE MEALS. (BREAKFAST AND SUPPER) What changed: See the new instructions.   primidone 50 MG tablet Commonly known as: MYSOLINE TAKE ONE TABLET BY MOUTH AT BEDTIME.   PROBIOTIC PO Take 1 tablet by mouth at bedtime.   True Metrix Blood Glucose Test test strip Generic  drug: glucose blood USE TO TEST BLOOD SUGAR AS DIRECTED.   Vitamin A 2400 MCG (8000 UT) Caps Take 8,000 Units by mouth every morning.   Vitamin D (Ergocalciferol) 1.25 MG (50000 UNIT) Caps capsule Commonly known as: DRISDOL Take 1 capsule (50,000 Units total) by mouth every 7 (seven) days.   warfarin 4 MG tablet Commonly known as: Coumadin Take as directed. If you are unsure how to take this medication, talk to your nurse or doctor. Original instructions: Take 2 tablets tonight, 1 1/2 tablets tomorrow night then increase dose to 1 tablet daily What changed:   how much to take  how to take this  when to take this  additional instructions      Allergies  Allergen Reactions  . Oseltamivir Hives    Other reaction(s): Other (see comments) "water blisters"  . Xarelto [Rivaroxaban] Other (See Comments)    Internal bleeding  . Ancef [Cefazolin] Nausea And Vomiting  . Levaquin [Levofloxacin In D5w] Other (See Comments)    "afib"  . Lyrica [Pregabalin] Hives  . Tamiflu [Oseltamivir Phosphate] Other (See Comments)    "water blisters"  . Zoloft [Sertraline Hcl] Other (See Comments)    Jaw problems, jittery  . Torsemide Other (See Comments)    Not effective  .  Augmentin [Amoxicillin-Pot Clavulanate] Itching  . Ciprofloxacin Itching and Nausea And Vomiting  . Haldol [Haloperidol] Other (See Comments)    Restless leg  . Nsaids Diarrhea  . Penicillins Itching, Nausea And Vomiting and Rash    Has patient had a PCN reaction causing immediate rash, facial/tongue/throat swelling, SOB or lightheadedness with hypotension: Yes Has patient had a PCN reaction causing severe rash involving mucus membranes or skin necrosis: No Has patient had a PCN reaction that required hospitalization No Has patient had a PCN reaction occurring within the last 10 years: Yes If all of the above answers are "NO", then may proceed with Cephalosporin use.  . Topamax [Topiramate] Nausea Only      The results of significant diagnostics from this hospitalization (including imaging, microbiology, ancillary and laboratory) are listed below for reference.    Significant Diagnostic Studies: CT Head Wo Contrast  Result Date: 03/20/2020 CLINICAL DATA:  Dizzy spells since COVID vaccine EXAM: CT HEAD WITHOUT CONTRAST TECHNIQUE: Contiguous axial images were obtained from the base of the skull through the vertex without intravenous contrast. COMPARISON:  CT brain 12/11/2015 FINDINGS: Brain: No acute territorial infarction, hemorrhage or intracranial mass. The ventricles are nonenlarged. Vascular: No hyperdense vessel or unexpected calcification. Skull: Normal. Negative for fracture or focal lesion. Sinuses/Orbits: No acute finding.  Frothy debris in the nasopharynx. Other: None IMPRESSION: Negative non contrasted CT appearance of the brain. Electronically Signed   By: Donavan Foil M.D.   On: 03/20/2020 16:37   DG Chest Port 1 View  Result Date: 03/23/2020 CLINICAL DATA:  Shortness of breath EXAM: PORTABLE CHEST 1 VIEW COMPARISON:  March 20, 2020 FINDINGS: Lungs are mildly hyperexpanded. There is bibasilar atelectasis. There is no edema or airspace opacity. There is cardiomegaly with  pulmonary vascularity within normal limits, stable. No adenopathy. Postoperative change in cervical spine. There is degenerative change in each shoulder. IMPRESSION: Lungs hyperexpanded, stable. Bibasilar atelectasis. No edema or airspace opacity. Stable cardiomegaly. Electronically Signed   By: Lowella Grip III M.D.   On: 03/23/2020 07:54   DG CHEST PORT 1 VIEW  Result Date: 03/20/2020 CLINICAL DATA:  Fall, cough EXAM: PORTABLE CHEST 1 VIEW COMPARISON:  04/27/2019 FINDINGS: Surgical hardware in the  cervical spine. Hyperinflated lungs. Cardiac enlargement without overt edema or pleural effusion. No focal consolidation. IMPRESSION: No active disease. Mild cardiomegaly. Electronically Signed   By: Donavan Foil M.D.   On: 03/20/2020 20:04   DG Hip Unilat W or Wo Pelvis 2-3 Views Left  Result Date: 03/20/2020 CLINICAL DATA:  Fall EXAM: DG HIP (WITH OR WITHOUT PELVIS) 2-3V LEFT COMPARISON:  None. FINDINGS: There is no acute fracture or dislocation. Mild changes of osteoarthritis. Bladder stimulator is present. IMPRESSION: No acute fracture. Electronically Signed   By: Macy Mis M.D.   On: 03/20/2020 16:28    Microbiology: Recent Results (from the past 240 hour(s))  Resp Panel by RT PCR (RSV, Flu A&B, Covid) - Nasopharyngeal Swab     Status: None   Collection Time: 03/20/20  5:05 PM   Specimen: Nasopharyngeal Swab  Result Value Ref Range Status   SARS Coronavirus 2 by RT PCR NEGATIVE NEGATIVE Final    Comment: (NOTE) SARS-CoV-2 target nucleic acids are NOT DETECTED.  The SARS-CoV-2 RNA is generally detectable in upper respiratoy specimens during the acute phase of infection. The lowest concentration of SARS-CoV-2 viral copies this assay can detect is 131 copies/mL. A negative result does not preclude SARS-Cov-2 infection and should not be used as the sole basis for treatment or other patient management decisions. A negative result may occur with  improper specimen  collection/handling, submission of specimen other than nasopharyngeal swab, presence of viral mutation(s) within the areas targeted by this assay, and inadequate number of viral copies (<131 copies/mL). A negative result must be combined with clinical observations, patient history, and epidemiological information. The expected result is Negative.  Fact Sheet for Patients:  PinkCheek.be  Fact Sheet for Healthcare Providers:  GravelBags.it  This test is no t yet approved or cleared by the [place] FDA and  has been authorized for detection and/or diagnosis of SARS-CoV-2 by FDA under an Emergency Use Authorization (EUA). This EUA will remain  in effect (meaning this test can be used) for the duration of the COVID-19 declaration under Section 564(b)(1) of the Act, 21 U.S.C. section 360bbb-3(b)(1), unless the authorization is terminated or revoked sooner.     Influenza A by PCR NEGATIVE NEGATIVE Final   Influenza B by PCR NEGATIVE NEGATIVE Final    Comment: (NOTE) The Xpert Xpress SARS-CoV-2/FLU/RSV assay is intended as an aid in  the diagnosis of influenza from Nasopharyngeal swab specimens and  should not be used as a sole basis for treatment. Nasal washings and  aspirates are unacceptable for Xpert Xpress SARS-CoV-2/FLU/RSV  testing.  Fact Sheet for Patients: PinkCheek.be  Fact Sheet for Healthcare Providers: GravelBags.it  This test is not yet approved or cleared by the [place] FDA and  has been authorized for detection and/or diagnosis of SARS-CoV-2 by  FDA under an Emergency Use Authorization (EUA). This EUA will remain  in effect (meaning this test can be used) for the duration of the  Covid-19 declaration under Section 564(b)(1) of the Act, 21  U.S.C. section 360bbb-3(b)(1), unless the authorization is  terminated or revoked.    Respiratory  Syncytial Virus by PCR NEGATIVE NEGATIVE Final    Comment: (NOTE) Fact Sheet for Patients: PinkCheek.be  Fact Sheet for Healthcare Providers: GravelBags.it  This test is not yet approved or cleared by the [place] FDA and  has been authorized for detection and/or diagnosis of SARS-CoV-2 by  FDA under an Emergency Use Authorization (EUA). This EUA will remain  in effect (meaning this  test can be used) for the duration of the  COVID-19 declaration under Section 564(b)(1) of the Act, 21 U.S.C.  section 360bbb-3(b)(1), unless the authorization is terminated or  revoked. Performed at [facility], [address], [place], [place] [address]   MRSA PCR Screening     Status: None   Collection Time: 03/20/20 10:13 PM   Specimen: Nasal Mucosa; Nasopharyngeal  Result Value Ref Range Status   MRSA by PCR NEGATIVE NEGATIVE Final    Comment:        The GeneXpert MRSA Assay (FDA approved for NASAL specimens only), is one component of a comprehensive MRSA colonization surveillance program. It is not intended to diagnose MRSA infection nor to guide or monitor treatment for MRSA infections. Performed at [facility], [address] [place], [place] [address]   Respiratory Panel by RT PCR (Flu A&B, Covid) - Nasopharyngeal Swab     Status: None   Collection Time: 03/23/20 11:05 AM   Specimen: Nasopharyngeal Swab  Result Value Ref Range Status   SARS Coronavirus 2 by RT PCR NEGATIVE NEGATIVE Final    Comment: (NOTE) SARS-CoV-2 target nucleic acids are NOT DETECTED.  The SARS-CoV-2 RNA is generally detectable in upper respiratoy specimens during the acute phase of infection. The lowest concentration of SARS-CoV-2 viral copies this assay can detect is 131 copies/mL. A negative result does not preclude SARS-Cov-2 infection and should not be used as the sole basis for treatment or other patient management decisions. A negative result  may occur with  improper specimen collection/handling, submission of specimen other than nasopharyngeal swab, presence of viral mutation(s) within the areas targeted by this assay, and inadequate number of viral copies (<131 copies/mL). A negative result must be combined with clinical observations, patient history, and epidemiological information. The expected result is Negative.  Fact Sheet for Patients:  PinkCheek.be  Fact Sheet for Healthcare Providers:  GravelBags.it  This test is no t yet approved or cleared by the [place] FDA and  has been authorized for detection and/or diagnosis of SARS-CoV-2 by FDA under an Emergency Use Authorization (EUA). This EUA will remain  in effect (meaning this test can be used) for the duration of the COVID-19 declaration under Section 564(b)(1) of the Act, 21 U.S.C. section 360bbb-3(b)(1), unless the authorization is terminated or revoked sooner.     Influenza A by PCR NEGATIVE NEGATIVE Final   Influenza B by PCR NEGATIVE NEGATIVE Final    Comment: (NOTE) The Xpert Xpress SARS-CoV-2/FLU/RSV assay is intended as an aid in  the diagnosis of influenza from Nasopharyngeal swab specimens and  should not be used as a sole basis for treatment. Nasal washings and  aspirates are unacceptable for Xpert Xpress SARS-CoV-2/FLU/RSV  testing.  Fact Sheet for Patients: PinkCheek.be  Fact Sheet for Healthcare Providers: GravelBags.it  This test is not yet approved or cleared by the [place] FDA and  has been authorized for detection and/or diagnosis of SARS-CoV-2 by  FDA under an Emergency Use Authorization (EUA). This EUA will remain  in effect (meaning this test can be used) for the duration of the  Covid-19 declaration under Section 564(b)(1) of the Act, 21  U.S.C. section 360bbb-3(b)(1), unless the authorization is  terminated  or revoked. Performed at [facility], [address] [place], [place] [address]      Labs: Basic Metabolic Panel: Recent Labs  Lab 03/20/20 1842 03/21/20 3664 03/22/20 4034 03/22/20 0307 03/23/20 0730 03/24/20 1119 03/25/20 0709 03/26/20 0608 03/27/20 0721  NA 121*   < >  134*   < > 139 138 137 131* 132*  K 3.3*   < > 4.2   < > 3.9 4.3 3.9 4.0 4.6  CL 67*   < > 85*   < > 92* 92* 91* 88* 89*  CO2 32   < > 34*   < > 33* 32 32 31 29  GLUCOSE 739*   < > 335*   < > 331* 217* 138* 270* 171*  BUN 136*   < > 99*   < > 69* 67* 68* 69* 68*  CREATININE 2.73*   < > 1.71*   < > 1.70* 1.78* 2.00* 2.07* 1.99*  CALCIUM 8.5*   < > 8.7*   < > 9.3 9.2 8.6* 8.1* 8.5*  MG 2.1  --  1.6*  --   --   --   --   --   --    < > = values in this interval not displayed.   Liver Function Tests: No results for input(s): AST, ALT, ALKPHOS, BILITOT, PROT, ALBUMIN in the last 168 hours. No results for input(s): LIPASE, AMYLASE in the last 168 hours. No results for input(s): AMMONIA in the last 168 hours. CBC: Recent Labs  Lab 03/20/20 1604 03/23/20 0730  WBC 11.5* 11.5*  NEUTROABS 9.7*  --   HGB 12.5 11.6*  HCT 36.2 36.4  MCV 83.6 90.3  PLT 230 222   Cardiac Enzymes: No results for input(s): CKTOTAL, CKMB, CKMBINDEX, TROPONINI in the last 168 hours. BNP: BNP (last 3 results) Recent Labs    04/27/19 1716 03/23/20 0730  BNP 318.0* 925.0*    ProBNP (last 3 results) No results for input(s): PROBNP in the last 8760 hours.  CBG: Recent Labs  Lab 03/26/20 1124 03/26/20 1714 03/26/20 1811 03/26/20 1940 03/27/20 0257  GLUCAP 207* 106* 94 214* 207*       Signed:  Nita Sells MD   [facility] 03/27/2020, 9:30 AM

## 2020-03-27 NOTE — Progress Notes (Signed)
Patient refused CPAP for tonight 

## 2020-03-27 NOTE — Care Management Important Message (Signed)
Important Message  Patient Details  Name: Denise Freeman MRN: [id number] Date of Birth: [birth year]   Medicare Important Message Given:  Yes - Important Message mailed due to current National Emergency     Tommy Medal 03/27/2020, 10:26 AM

## 2020-03-27 NOTE — Progress Notes (Signed)
Patient upset - reported shes going to sue everyone due to being "treated poorly." Attempted to pull own IV out, threw phone across the room. Stated "I thought you were a nice person but you are just trying to get over on me too!" Attempted to redirect without success. Attempting to discuss discharge instructions but patient refused. MD aware.

## 2020-03-29 ENCOUNTER — Other Ambulatory Visit: Payer: Self-pay | Admitting: *Deleted

## 2020-03-29 NOTE — Patient Outreach (Signed)
[facility]) Care Management  [place]  03/29/2020   ANGLE DIRUSSO [birth year] [id number]  Telephone Assessment  Chronic care management Care manager reassigned on 01/20/20.     Patient is a 69 year old female with PMHX: Combined Systolic and Diastolic Heart Failure, Chronic hypoxic respiratory failure, chronic oxygen therapy at 3 liters OSA with Bipap, Chronic pain syndrome, hypertension, Chronic Atrial Fib, Diabetes( A1c 13.9% on 6/2/21per [facility])Depression with anxiety, partially blind both eyes, hard of hearing .   Inpatient Admission at [facility] 10/18-10/20/21  ( left AMA) Dx: Hyperglycemia  Hyperosmolar non-ketotic state. Inpatient admission [facility]  10/21-10/25/21 Acute Heart Failure.     Outreach Attempt  Subjective:  Unsuccessful outreach call to patient , no answer unable to leave a message.   1400 Successful return call to patient , she discussed feeling not good, and wasn't feeling good since she left the hospital and really since Covid vaccine in September. She discussed having many care concern during hospital stay and has followed up  to complain.   Patient discussed concerns related to   Diabetes She reports that her Elenor Legato sensor is not on, she reports having a glucose  meter but it will not measure right , and she will not use it, only will use Libre. Discussed with patient if her spouse will be able to assist with placing sensor that he will. Discussed the importance of montioring blood sugar with taking insulin. Discussed symptom of low blood sugar she denies having any. She reports that she is eating meals, not eating much at a time, and drinking diet gingerale .    Heart failure Patient complains that her legs are more swollen that it is not safe for her to walk. She states not being able to stand to balance on the scale since discharge. She denies worsening in shortness of breath states it is about the same always  a little short of breath. She voiced that her lasix was cut back at discharge.   COPD She continues to wear oxygen at 3 liters and cpap at night and when sleeping during the day.   Fall Patient discussed having fall prior to admission and fall once since being home. She attributes fall to having more swelling in her legs and her legs just become weak.  She states not injury from fall but her chronic pain is just worse. She reports being mostly in the bed and up to bedside commode due to fear of falling.  Patient discussed needing a new bedside commode heavy duty one, the one she has came from a yard sale .   Patient states that she thought she was to have someone come on check on her at home, discussed home health services. She is in agreement with home health services will benefit from home health RN, physical therapy. She states that [facility] past . She is in agreement with me contacting PCP office with concerns.  Patient states that she has spoken with them on today and told them she is not able to come in for an appointment yet.  Medications  Patient reports understanding of new changes in insulin plan and taking as prescribed. She states that she was not aware of Losartan was stopped and she took it this am.  Reviewed discharge AVS, medication list with patient reading back each medication .   Outpatient Encounter Medications as of 03/29/2020  Medication Sig Note  . albuterol (  PROVENTIL) (2.5 MG/3ML) 0.083% nebulizer solution Take 3 mLs (2.5 mg total) by nebulization every 6 (six) hours as needed for wheezing or shortness of breath.   . allopurinol (ZYLOPRIM) 100 MG tablet TAKE 1 TABLET BY MOUTH ONCE DAILY. (Patient taking differently: Take 100 mg by mouth daily. )   . budesonide (PULMICORT) 0.5 MG/2ML nebulizer solution Take 2 mLs (0.5 mg total) by nebulization 2 (two) times daily.   . cholecalciferol (VITAMIN D) 400 units TABS tablet Take 1 tablet (400 Units  total) by mouth 2 (two) times daily.   . clobetasol cream (TEMOVATE) 6.25 % Apply 1 application topically daily.    . colchicine 0.6 MG tablet Take 1 tablet (0.6 mg total) by mouth as needed. (Patient taking differently: Take 0.6 mg by mouth as needed (gout). )   . escitalopram (LEXAPRO) 10 MG tablet Take 1 tablet (10 mg total) by mouth at bedtime.   . FEROSUL 325 (65 Fe) MG tablet Take 325 mg by mouth daily.    . fluticasone (FLONASE) 50 MCG/ACT nasal spray Place 1 spray into both nostrils 2 (two) times daily.   . furosemide (LASIX) 40 MG tablet Take 1 tablet (40 mg total) by mouth 2 (two) times daily.   . Insulin Glargine (BASAGLAR KWIKPEN) 100 UNIT/ML Inject 20 Units into the skin 2 (two) times daily.   . insulin lispro (HUMALOG) 100 UNIT/ML KiwkPen Inject 0.02 mLs (2 Units total) into the skin 3 (three) times daily before meals.   Marland Kitchen ipratropium (ATROVENT) 0.02 % nebulizer solution Take 2.5 mLs (0.5 mg total) by nebulization every 6 (six) hours as needed for wheezing or shortness of breath.   . metoprolol succinate (TOPROL-XL) 25 MG 24 hr tablet TAKE 1 TABLET BY MOUTH TWICE DAILY (Patient taking differently: Take 25 mg by mouth in the morning and at bedtime. ) 03/22/2020: LF: 03/08/2020 DS:28   . mirtazapine (REMERON) 15 MG tablet Take 1 tablet (15 mg total) by mouth at bedtime. 03/22/2020: LF: 03/08/2020 DS:28   . modafinil (PROVIGIL) 200 MG tablet Take 1 tablet (200 mg total) by mouth daily. 03/22/2020: LF: 03/08/2020 DS:28   . montelukast (SINGULAIR) 10 MG tablet Take 1 tablet (10 mg total) by mouth at bedtime. 03/22/2020: LF: 03/08/2020 DS:28   . nitroGLYCERIN (NITROSTAT) 0.4 MG SL tablet Place 0.4 mg under the tongue every 5 (five) minutes as needed for chest pain.  03/22/2020: Pt hasn't needed  . oxyCODONE-acetaminophen (PERCOCET/ROXICET) 5-325 MG tablet Take 1 tablet by mouth 2 (two) times daily as needed for severe pain. 03/22/2020: LF:03/01/2020 DS:30   . pantoprazole (PROTONIX) 20 MG  tablet TAKE (1) TABLET BY MOUTH TWICE A DAY BEFORE MEALS. (BREAKFAST AND SUPPER) (Patient taking differently: Take 20 mg by mouth 2 (two) times daily before a meal. ) 03/22/2020: LF: 03/08/2020 DS:28   . primidone (MYSOLINE) 50 MG tablet TAKE ONE TABLET BY MOUTH AT BEDTIME. (Patient taking differently: Take 50 mg by mouth at bedtime. ) 03/22/2020: LF: 03/08/2020 DS:28   . Probiotic Product (PROBIOTIC PO) Take 1 tablet by mouth at bedtime.   . TRUE METRIX BLOOD GLUCOSE TEST test strip USE TO TEST BLOOD SUGAR AS DIRECTED.   . Vitamin A 2400 MCG (8000 UT) CAPS Take 8,000 Units by mouth every morning.  03/22/2020: LF: 03/08/2020 DS: 28  . Vitamin D, Ergocalciferol, (DRISDOL) 1.25 MG (50000 UNIT) CAPS capsule Take 1 capsule (50,000 Units total) by mouth every 7 (seven) days.   Marland Kitchen warfarin (COUMADIN) 4 MG tablet Take 2 tablets tonight,  1 1/2 tablets tomorrow night then increase dose to 1 tablet daily (Patient taking differently: Take 4 mg by mouth daily. ) 03/22/2020: Per Pharmacy records, warfarin was filled as 1t;po;d LF: 03/08/2020 DS:28  . [DISCONTINUED] potassium chloride 20 MEQ TBCR Take 20 mEq by mouth 2 (two) times daily.    No facility-administered encounter medications on file as of 03/29/2020.    Assessment  Patient will benefit from home health assessment fall safety, [facility] for Heart failure management post discharge.  Benefit from follow up with PCP regarding current concerns.   Goals Addressed            This Visit's Progress   . Make and Keep All Appointments       Follow Up Date 04/12/20   - call to cancel if needed - keep a calendar with appointment dates Reschedule missed appointments     Why is this important?   Part of staying healthy is seeing the doctor for follow-up care.  If you forget your appointments, there are some things you can do to stay on track.    Notes:     Marland Kitchen Manage My Medicine   On track    Follow Up Date 04/06/20   - call for medicine refill 2 or 3  days before it runs out  - take medications as prescribed per most recent after visit summary   Why is this important?   These steps will help you keep on track with your medicines.    Notes:  Pharmacy referral due to cost of insulins    . Monitor and Manage My Blood Sugar   Not on track    Follow Up Date 04/04/20   - check blood sugar at prescribed times - check blood sugar if I feel it is too high or too low - take the blood sugar meter to all doctor visits  Replace libre sensor to monitor blood sugars - with spouse assistance    Why is this important?   Checking your blood sugar at home helps to keep it from getting very high or very low.  Writing the results in a diary or log helps the doctor know how to care for you.  Your blood sugar log should have the time, date and the results.  Also, write down the amount of insulin or other medicine that you take.  Other information, like what you ate, exercise done and how you were feeling, will also be helpful.     Notes:     . Obtain Eye Exam   Not on track    Follow Up Date11/30/21   - keep appointment with eye doctor  Rescheduled missed appointments    Why is this important?   Eye check-ups are important when you have diabetes.  Vision loss can be prevented.    Notes:     . Perform Foot Care   On track    Follow Up Date 05/02/20   - check feet daily for cuts, sores or redness - wash and dry feet carefully every day - wear comfortable, cotton socks - wear comfortable, well-fitting shoes    Why is this important?   Good foot care is very important when you have diabetes.  There are many things you can do to keep your feet healthy and catch a problem early.    Notes:  Encourage spouse to assist with observance of feet and with care     . Set My Target A1C   Not  on track    Follow Up Date 04/04/20   - set target A1C    Why is this important?   Your target A1C is decided together by you and your doctor.  It is  based on several things like your age and other health issues.    Notes:  Call to PCP office regarding Ac1    . Track and Manage Activity and Exertion   Not on track    Follow Up Date 04/18/20   - pace activity allowing for rest    Why is this important?   Exercising is very important when managing your heart failure.  It will help your heart get stronger.    Notes:  Fall safety, use of walker, bedside commode     . Track and Manage Fluids and Swelling   Not on track    Follow Up Date 05/02/20   - call office if I gain more than 2 pounds in one day or 5 pounds in one week - do ankle pumps when sitting - keep legs up while sitting - track weight in diary - watch for swelling in feet, ankles and legs every day - weigh myself daily    Why is this important?   It is important to check your weight daily and watch how much salt and liquids you have.  It will help you to manage your heart failure.    Notes:     . Track and Manage Symptoms   Not on track    Follow Up Date 05/02/20   - bring diary to all appointments - follow rescue plan if symptoms flare-up - know when to call the doctor - dress right for the weather, hot or cold    Why is this important?   You will be able to handle your symptoms better if you keep track of them.  Making some simple changes to your lifestyle will help.  Eating healthy is one thing you can do to take good care of yourself.    Notes:         Plan:  Placed call to Dianna Rossetti office to notify of patient concerns related to having fall at home, noted swelling in lower legs, requesting HHRN and physical therapy, only able to leave a message on [employer]  for return call.  Reviewed with patient worsening symptoms to notify MD of sooner, increased shortness, worsening swelling, falls , signs/symptoms of low blood sugar.  Will plan follow up call to patient in the next 2 business days.  Secure message to [place]  Pharmacist, regarding prior referral regarding insulin cost, and concern for medication compliance.     Joylene Draft, RN, BSN  [place] Management Coordinator  [phone]- Mobile [phone]- Toll Free Main Office

## 2020-03-30 ENCOUNTER — Other Ambulatory Visit: Payer: Self-pay | Admitting: *Deleted
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
## 2020-03-30 NOTE — Patient Outreach (Signed)
[place] [facility]) Care Management  03/30/2020  [place]rer[place] Harold Rehberg Dec 23, 1950 [id number]   Care Coordination  Chronic care management  Inpatient Admission at [facility] 10/18-10/20/21  ( left AMA) Dx: Hyperglycemia  Hyperosmolar non-ketotic state. Inpatient admission [facility]  10/21-10/25/21 Acute Heart Failure.   Subjective: Outreach follow up call to [name] , NP, PCP office to follow up on call to office on 10/27 related to patient concerns, weakness , fall, diabetes.  Request to speak with Nurse , transferred to nurse line able to leave a message for return call.   Plan Will await return call. plan follow up with patient on next day.   Joylene Draft, RN, BSN  [place] Management Coordinator  [phone]- Mobile [phone]- Toll Free Main Office

## 2020-03-31 ENCOUNTER — Other Ambulatory Visit: Payer: Self-pay | Admitting: *Deleted

## 2020-03-31 ENCOUNTER — Telehealth: Payer: Self-pay | Admitting: Internal Medicine

## 2020-03-31 ENCOUNTER — Telehealth: Payer: Self-pay | Admitting: Cardiology

## 2020-03-31 NOTE — Patient Outreach (Signed)
[facility]) Care Management  03/31/2020  Symphony Demuro [facility] 06/03/1951 [id number]   Care Coordination   Care Coordination  Chronic care management  Inpatient Admission at [facility] 10/18-10/20/21 ( left AMA) Dx: Hyperglycemia Hyperosmolar non-ketotic state. Inpatient admission [facility] 10/21-10/25/21 Acute Heart Failure.   Subjective: Outreach follow up call to [name] , NP, PCP office to follow up on call to office on 10/27 related to patient concerns, weakness , fall, diabetes.  Request to speak with Provider nurse explained prior calls to office receptionist states she will transfer me to nurse , again able to leave message regarding concerns and requesting follow up call .  Outreach call to patient , she reports feeling a little better on today, able to move around a little more in home , but some difficulty due to swelling in legs. She discussed calling on call provider to cardiology office, receiving instructions to increase lasix dose for this morning,able to repeat back instructions given  and placing call to PCP office and cardiology office.  Patient reports that she has been able to weigh with reports weigh gain of 5 lbs since discharge, reports weight at 173.She denies increase in her usual shortness of breath.  She reports sitting in chair   Patient discussed her plan to call cardiology office to schedule sooner office visit before scheduled appt. On 11/9, she plans to call provider office on today for an appointment, declined assistance stating she know when her husband can take her to not interfere with his appointments.  Patient discussed not wanting to go back into the hospital and planning to call over the weekend if no improvement in swelling or increased weigh gain.   Discussed with patient call to provider office on today to follow up on request for home health RN and new bedside commode.   Plan Will plan follow up call to  patient in the next 3 business days or sooner to update on communication from PCP office.  Reinforced with patient to notify MD of worsening symptoms sooner and 911 for emergency.    Joylene Draft, RN, BSN  [place] Management Coordinator  [phone]- Mobile [phone]- Toll Free Main Office

## 2020-03-31 NOTE — Telephone Encounter (Signed)
Pt c/o medication issue:  1. Name of Medication: furosemide (LASIX) 40 MG tablet  2. How are you currently taking this medication (dosage and times per day)? Twice daily   3. Are you having a reaction (difficulty breathing--STAT)? Swelling  4. What is your medication issue? Pt needs to know how much medicine to take  Pt c/o swelling: STAT is pt has developed SOB within 24 hours  1) How much weight have you gained and in what time span? 5 pounds since her hospital dx  2) If swelling, where is the swelling located? feet  3) Are you currently taking a fluid pill? Yes   4) Are you currently SOB? no  5) Do you have a log of your daily weights (if so, list)?   6) Have you gained 3 pounds in a day or 5 pounds in a week?   7) Have you traveled recently?

## 2020-03-31 NOTE — Telephone Encounter (Signed)
[name] called to report that she has gained weight and developed worsening lower extremity edema since recent discharge from [facility]. She has chronic heart failure and CKD, and loop diuretic dose was decreased on discharge to Lasix 40 mg BID. She also has concerns about chronic weakness and numbness of legs. I recommended taking Lasix 80 mg tomorrow morning, followed by a call to her PCP and/or cardiologist to arrange close outpatient follow up for multiple ongoing concerns.   Denise Shipper, MD

## 2020-03-31 NOTE — Telephone Encounter (Signed)
Left message to call back  

## 2020-04-01 ENCOUNTER — Telehealth: Payer: Self-pay | Admitting: Internal Medicine

## 2020-04-01 NOTE — Telephone Encounter (Signed)
She called regarding persistent swelling.  Discussed with patient and she was told yesterday to take Lasix 40 mg tablet x2 3 times (morning, afternoon, evening) and feels that swelling has not improved much.  Due to this recommended additional dose of Lasix to add to her current dosing for tonight to recall in the morning where she can be evaluated again based on the response to the Lasix and considered for potential ER evaluation.  Assuming she has good response 120 mg she may continue this through the weekend as she does have a follow-up appointment with her primary on Monday.  She may need to be transitioned to torsemide for improved diuresis and she will need lab work.  [place]

## 2020-04-03 ENCOUNTER — Other Ambulatory Visit (HOSPITAL_COMMUNITY): Payer: Self-pay | Admitting: Internal Medicine

## 2020-04-03 ENCOUNTER — Other Ambulatory Visit: Payer: Self-pay | Admitting: *Deleted

## 2020-04-03 DIAGNOSIS — I4811 Longstanding persistent atrial fibrillation: Secondary | ICD-10-CM | POA: Diagnosis not present

## 2020-04-03 DIAGNOSIS — J069 Acute upper respiratory infection, unspecified: Secondary | ICD-10-CM | POA: Diagnosis not present

## 2020-04-03 DIAGNOSIS — J9811 Atelectasis: Secondary | ICD-10-CM | POA: Diagnosis not present

## 2020-04-03 DIAGNOSIS — R059 Cough, unspecified: Secondary | ICD-10-CM

## 2020-04-03 DIAGNOSIS — E1165 Type 2 diabetes mellitus with hyperglycemia: Secondary | ICD-10-CM | POA: Diagnosis not present

## 2020-04-03 DIAGNOSIS — N189 Chronic kidney disease, unspecified: Secondary | ICD-10-CM | POA: Diagnosis not present
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
## 2020-04-03 NOTE — Telephone Encounter (Signed)
If she is on lasix 40 mg BID - then increase to 40 mg TID until she sees me on 11/4.  Dr Lemmie Evens

## 2020-04-03 NOTE — Telephone Encounter (Signed)
Can we check in on this patient and her swelling   Zandra Abts MD

## 2020-04-03 NOTE — Telephone Encounter (Signed)
Spoke with pt and she states that she is still swollen. She was unable to weight herself today. Legs are swollen and tight. Pt states that she is SOB at times. States that she not any better that when she was in the hospital. She states that medications were changed while in the hospital. Pt is scheduled to see [name] on Thursday.

## 2020-04-03 NOTE — Patient Outreach (Signed)
[facility]) Care Management  04/03/2020  Denise Freeman 1950-10-22 [id number]   Red on EMMI Alert  Day #4 Date:04/01/20 Red Alert Reason: Sad/Hopeless/Anxious/Empty? Yes   Outreach Attempt #1 Subjective: Outreach call to patient   Subjective: Successful outreach call to patient to address EMMI red flag. Patient discussed feeling down related to current medical condition since hospitalization not feeling much better at discharge  and not being able to sleep . She reports some improvement with rest on last night feeling some better on today, stating everything trying to come back . She reports having a headache on last night she relates it to not getting sleep.   Patient continues to verbalize  concerns related recent hospitalization, current medical conditions. She discussed she started getting worse after covid vaccine.  She again discussed care received during hospital stay, no really understanding what she was being treated for. Reviewed with patient, recent admissions diagnosis for hyperglycemia 1st admission  and heart failure 2nd admission.   Heart failure. Patient discussed some improvement with swelling in lower legs. She reports weight on yesterday 174, she reports weighing on today but was not able to see number on scales her brother or spouse was not at home.  She reports legs not leaving an indent when she pressures in and has improvement in feeling in legs, feet still have some swelling as well as hands. .  She denies increase in her usual shortness of breath.  She discussed calling doctor over the weekend regarding swelling in her legs, she reports taking Lasix 40 mg three times a day  . She wants to make sure that she is taking the right amount of lasix for today states she was told to call office today, spoke with someone regarding appointment but not about Lasix .  She reports having office visit with [name] on Thursday, November 4 and [name]  November 4 at 1 pm.  Diabetes Patient discussed having an appointment with [name] office on 11/4. Patient continues to decline using cbg meter to prick for checking blood sugars. Patient declares only the Elenor Legato works for her. Stressed importance of knowing what blood sugar readings are with taking insulin. She states that it doesn't work when she or brother have tried to place sensor for [place] back on so she always goes back to the office.    Social  Patient with concerns regarding getting copy of her medical records, she gave verbal agreement that I contact Medical records, medical records on line explained patient request for medical records,patient provided contact information and reviewed process for release of information form to be mailed to her, she sign it and return in mail. Patient does not have access to  email/fax .  Verified patient has contact information for office of patient experience to discuss concerns she verifies having business card of representative that spoke with her doing hospital.     Plan Placed call to Dr.Hilty office regarding clarifying how much lasix she is to continue to take, spoke with representative, she states nurse line  is busy she took the message, she states that someone from the office has been trying to call patient, patient is aware of office visit with [name] on November 4.  Placed call to PCP office spoke with Amber,discussed patient benefit from home health RN , heart failure management after recent hospital discharge, she will send [name] a message. She reports patient telehealth visit on today and for labs on Thursday.  Will plan return call to patient within the next 4 business days .   Joylene Draft, RN, BSN  [place] Management Coordinator  [phone]- Mobile [phone]- Toll Free Main Office

## 2020-04-03 NOTE — Telephone Encounter (Signed)
Left message for the pt to call back.  Pt has 04/06/20 office visit with [name].

## 2020-04-03 NOTE — Telephone Encounter (Signed)
Per [facility] RN notes, patient was 174lbs yesterday, weighed today but cannot see well (could not see scale) She called in to after hours line on 10/29 and 10/30 w/concerns about swelling  Will send to MD to review notes and advise on lasix dose until 04/06/20 visit

## 2020-04-03 NOTE — Telephone Encounter (Signed)
° ° °  Denise Freeman is calling back, she said pt wanted to know what she needs to do with her lasix how much she needs to take before seeing [name] on 11/04. She said to call pt back

## 2020-04-04 ENCOUNTER — Other Ambulatory Visit: Payer: Self-pay | Admitting: *Deleted

## 2020-04-04 ENCOUNTER — Telehealth: Payer: Self-pay | Admitting: Physician Assistant

## 2020-04-04 NOTE — Telephone Encounter (Signed)
Denise Freeman from [facility] is calling for the patient. She wants to know how much Lasix she has to take.

## 2020-04-04 NOTE — Patient Outreach (Signed)
[facility]) Care Management  04/04/2020  Denise Freeman [facility] April 20, 1951 [id number]   Care Coordination   Placed call to [name] office, requesting to speak with nurse regarding patient concerns,regarding her lasix scheduled placed on hold to speak with nurse. Representative Doren Custard returned to phone I  explained my call to office on yesterday to clarify patient lasix dose after her call to on call provider during the weekend and she was advised to call office on Monday. Doren Custard  states patient called to office on yesterday regarding her lasix dose, and was advised to continue 40 mg three times a day until appointment on 11//4.   Received call from Cardiology PA, Bhagat  inquiring about how much lasix patient is taking, explained my call to office on yesterday afternoon to clarify how much lasix patient is to take. Cardiology PA requested that I speak with patient to verify how much lasix that she is taking, and return call to him at [phone].   Outreach call to patient , she reports doing about the same still has swelling in lower legs, keeping legs elevated during the day. She reports that her weight is 165 on this am, discussed prior reported weight of 174 on 10/31.  Discussed with patient clarifying how much lasix that she is taking , she reports taking Lasix 40 mg , 3 tablets in the morning and 3 in the evening as was instructed to take over the weekend and on last evening after call from cardiology office she states she is to continue this until seen in office.  She states that she is peeing good amounts, but still has fluid.  Patient verbalizes upcoming appointment at [name] office and plans to transition to Dr. Harl Bowie in [place] the next visit.   Placed return call to Cardiology PA,Bhagat at [phone], to follow up regarding patient stated Lasix schedule. Discussed recent medication changes at discharge , discontinuing potassium and metolazone.   Received  return call from Ginger at Dr. Nevada Crane office, discussed benefit of home health RN, she states that they have discussed with patient recently and she declined needed, discussed patient voiced interest in last week but today states " what for" and reviewed benefits with patient . Ginger states that they will address again at visit on Thursday . Informed Ginger of patient request for new bedside commode and patient emphasizing a sturdy one.   Plan Will plan follow up call with patient in the next 4 business days.    Joylene Draft, RN, BSN  [place] [facility] Coordinator  [phone]- Mobile [phone]- Toll Free Main Office

## 2020-04-04 NOTE — Telephone Encounter (Signed)
Looks like this was addressed by one of the PAs in the [place] office, defer to [facility] which is her home office, has appt coming up with Dr Miguel Dibble MD

## 2020-04-04 NOTE — Telephone Encounter (Signed)
Spoke with [facility], Patient is taking lasix 120mg  BID. Still has edema but weight is down. Reviewed yesterday's conversation. Recommended to continue current dose and keep appointment with [name].

## 2020-04-06 ENCOUNTER — Telehealth: Payer: Self-pay | Admitting: Internal Medicine

## 2020-04-06 ENCOUNTER — Telehealth: Payer: Self-pay

## 2020-04-06 ENCOUNTER — Telehealth (INDEPENDENT_AMBULATORY_CARE_PROVIDER_SITE_OTHER): Payer: Medicare Other | Admitting: Internal Medicine

## 2020-04-06 ENCOUNTER — Other Ambulatory Visit: Payer: Self-pay | Admitting: *Deleted

## 2020-04-06 ENCOUNTER — Other Ambulatory Visit: Payer: Self-pay

## 2020-04-06 ENCOUNTER — Encounter: Payer: Self-pay | Admitting: Internal Medicine

## 2020-04-06 VITALS — BP 145/80

## 2020-04-06 DIAGNOSIS — N183 Chronic kidney disease, stage 3 unspecified: Secondary | ICD-10-CM | POA: Diagnosis not present

## 2020-04-06 DIAGNOSIS — J9610 Chronic respiratory failure, unspecified whether with hypoxia or hypercapnia: Secondary | ICD-10-CM | POA: Diagnosis not present

## 2020-04-06 DIAGNOSIS — E782 Mixed hyperlipidemia: Secondary | ICD-10-CM | POA: Diagnosis not present

## 2020-04-06 DIAGNOSIS — R251 Tremor, unspecified: Secondary | ICD-10-CM | POA: Diagnosis not present

## 2020-04-06 DIAGNOSIS — M109 Gout, unspecified: Secondary | ICD-10-CM | POA: Diagnosis not present

## 2020-04-06 DIAGNOSIS — Z79899 Other long term (current) drug therapy: Secondary | ICD-10-CM | POA: Diagnosis not present

## 2020-04-06 DIAGNOSIS — Z6836 Body mass index (BMI) 36.0-36.9, adult: Secondary | ICD-10-CM | POA: Diagnosis not present

## 2020-04-06 DIAGNOSIS — K219 Gastro-esophageal reflux disease without esophagitis: Secondary | ICD-10-CM | POA: Diagnosis not present

## 2020-04-06 DIAGNOSIS — R21 Rash and other nonspecific skin eruption: Secondary | ICD-10-CM | POA: Diagnosis not present

## 2020-04-06 DIAGNOSIS — I4891 Unspecified atrial fibrillation: Secondary | ICD-10-CM

## 2020-04-06 DIAGNOSIS — K5900 Constipation, unspecified: Secondary | ICD-10-CM | POA: Diagnosis not present

## 2020-04-06 DIAGNOSIS — G8929 Other chronic pain: Secondary | ICD-10-CM | POA: Diagnosis not present

## 2020-04-06 DIAGNOSIS — I5023 Acute on chronic systolic (congestive) heart failure: Secondary | ICD-10-CM | POA: Diagnosis not present

## 2020-04-06 DIAGNOSIS — E559 Vitamin D deficiency, unspecified: Secondary | ICD-10-CM | POA: Diagnosis not present

## 2020-04-06 DIAGNOSIS — J449 Chronic obstructive pulmonary disease, unspecified: Secondary | ICD-10-CM | POA: Diagnosis not present

## 2020-04-06 DIAGNOSIS — R590 Localized enlarged lymph nodes: Secondary | ICD-10-CM | POA: Diagnosis not present

## 2020-04-06 MED ORDER — FUROSEMIDE 40 MG PO TABS: 120.0000 mg | ORAL_TABLET | Freq: Two times a day (BID) | ORAL | 3 refills | Status: DC

## 2020-04-06 MED ORDER — METOLAZONE 2.5 MG PO TABS: 2.5000 mg | ORAL_TABLET | Freq: Every day | ORAL | 3 refills | Status: DC

## 2020-04-06 NOTE — Patient Outreach (Signed)
[facility]) Care Management  04/06/2020  Denise Freeman [facility] 19-Dec-1950 [id number]   Multidisciplinary Case Discussion   Patient is a 69 year old female with PMHX: Combined Systolic and Diastolic Heart Failure, Chronic hypoxic respiratory failure, chronic oxygen therapy at 3 liters OSA with Bipap, Chronic pain syndrome, hypertension, Chronic Atrial Fib, Diabetes( A1c 13.9% on 6/2/21per [facility])Depression with anxiety, partially blind both eyes, hard of hearing .   Inpatient Admission at [facility] 10/18-10/20/21  ( left AMA) Dx: Hyperglycemia  Hyperosmolar non-ketotic state. Inpatient admission [facility]  10/21-10/25/21 Acute Heart Failure.    Patient case has been discussed in a multidisciplinary case discussion.   Discussion Recommendations/Plan : Follow up with [facility] Pharmacist regarding patient concern for insulin cost.  Follow up with [facility] CMA regarding criteria for sending patient talking scales., email message sent on today, will follow up with patient prior requesting scales being sent to home.  Will follow up with [facility] to determine if program available in  [employer] to close follow up with patient with weekly calls for now for complex case management for chronic medical conditions of Heart failure, COPD.  Scheduled outreach call to patient in the next business day.    Joylene Draft, RN, BSN  [place] Management Coordinator  [phone]- Mobile [phone]- Toll Free Main Office

## 2020-04-06 NOTE — Progress Notes (Signed)
Virtual Visit via Telephone Note   This visit type was conducted due to national recommendations for restrictions regarding the COVID-19 Pandemic (e.g. social distancing) in an effort to limit this patient's exposure and mitigate transmission in our community.  Due to her co-morbid illnesses, this patient is at least at moderate risk for complications without adequate follow up.  This format is felt to be most appropriate for this patient at this time.  The patient did not have access to video technology/had technical difficulties with video requiring transitioning to audio format only (telephone).  All issues noted in this document were discussed and addressed.  No physical exam could be performed with this format.  Please refer to the patient's chart for her  consent to telehealth for [facility].   Date:  04/06/2020   ID:  [name], DOB 1951-02-24, MRN [id number] The patient was identified using 2 identifiers.  Evaluation Performed:  Follow-Up Visit  Patient Location:  [address] [place] [address]  Provider location:   [address] [place], [place] [address]  PCP:  [name], NP  Cardiologist:  Pixie Casino, MD Electrophysiologist:  None   Chief Complaint: Leg swelling, recent hospitalizations  History of Present Illness:    Denise Freeman is a 69 y.o. female who presents via audio/video conferencing for a telehealth visit today.  This is a 69 year old patient have followed for a number of years with a history of bipolar disorder, atrial fibrillation, COPD and chronic combined systolic and diastolic heart failure.  Her last echo in 2020 showed an EF of 50 to 55% with grade 2 diastolic dysfunction and elevated left atrial pressure.  More recently she was hospitalized twice at [facility] in October with hyperglycemia/hyperosmolar nonketotic hyperglycemia, initial blood sugar of 740, acute kidney injury on CKD 3B, hypokalemia, chronic  respiratory failure and other medical problems.  After being discharged she had progressive and worsening lower extremity edema and then was readmitted the next day.  She was then in the hospital for several more days.  She was very upset about this hospitalization.  She said she was "treated like a dog".  There were some documented issues by the treating staff, ultimately recommended titrations of her psychiatric medications to help with severe anxiety during her hospitalization.  Today she is noted to be tearful.  She had an appointment today but showed of hours before that was not able to be accommodated.  Were now seeing her via telephone visit.  She was able to see one of her primary care providers.  She reports that she is not peeing well and has significant lower extremity edema.  At discharge after diuresis her dry weight was 76 kg.  She was placed on Lasix 40 mg twice daily.  Subsequently she had contacted the office and I advised increasing her Lasix to 40 mg 3 times daily several days ago with a plan to see her in the office today to assess for follow-up.  She did call in several times and spoke with our triage staff who indicated that she actually was taking 3 tablets twice a day or 120 mg twice daily rather than 40 mg 3 times daily.  This is a significantly increased dose.  Despite this she says her edema is not improving and that her "legs are swollen like sausages".  Unfortunately via telephone visit I was not able to assess this adequately today.  She did have some recent lab work about  10 days ago, which was at discharge.  Her basic metabolic profile shows mild improvement in serum creatinine from 2.07-1.99 with a sodium of 132.  The patient does not have symptoms concerning for COVID-19 infection (fever, chills, cough, or new SHORTNESS OF BREATH).    Prior CV studies:   The following studies were reviewed today:  Chart reviewed, lab work, recent hospitalizations  PMHx:  Past Medical  History:  Diagnosis Date   Allergy    Anemia    Anxiety    Asthma    Atrial fibrillation ([place])    Bipolar 1 disorder ([place])    Blind    "partially in both eyes" (03/14/2016)   Cholelithiasis    a. 09/2016 s/p Lap Chole.   Chronic bronchitis (HCC)    Chronic combined systolic and diastolic congestive heart failure ([place])    a. 09/2016 Echo: EF 30-35%.   Colon polyps    COPD (chronic obstructive pulmonary disease) (HCC)    Depression    Family history of adverse reaction to anesthesia    Uncle was positive for malignant hyperthermia; patient had testing done and was negative.   Fibromyalgia    GERD (gastroesophageal reflux disease)    Gout    High cholesterol    History of blood transfusion 06/2015   "bleeding from my rectum"   History of hiatal hernia    HOH (hard of hearing)    Hx of colonic polyps 03/21/2016   3 small adenomas no recall - co-morbidities   Neuropathy    Disc Back    NICM (nonischemic cardiomyopathy) ([place])    a. Previously worked up in [place], MD-->low EF with subsequent recovery.  Multiple caths (last ~ 2014 per pt report)--reportedly nl cors;  b. 09/2016 Echo: EF 30-35%, antsept/apical HK, mild MR, mildly dil LA, mod dil RA;  c. 09/2016 Lexi MV: EF 26%, glob HK, sept DK, med size, mod intensity fixed septal defect - BBB/PVC related artifact, no ischemia.   On home oxygen therapy    "3L; 24/7" (03/14/2016)   OSA treated with BiPAP    uses biPAP, 10 (03/14/2016)   Osteoarthritis    Oxygen deficiency    Pneumonia    Type II diabetes mellitus ([place])     Past Surgical History:  Procedure Laterality Date   APPENDECTOMY     "they busted"   bladder stimulator     pt states, "it cannot be turned off; it's in my right hip; dead battery so it's not working anymore". (03/14/2016)   BLADDER SUSPENSION     2003, 2006 and 2010   CATARACT EXTRACTION W/PHACO Right 11/29/2014   Procedure: CATARACT EXTRACTION PHACO AND INTRAOCULAR LENS  PLACEMENT (IOC);  Surgeon: Rutherford Guys, MD;  Location: [facility];  Service: Ophthalmology;  Laterality: Right;  CDE:3.81   CATARACT EXTRACTION W/PHACO Left 12/13/2014   Procedure: CATARACT EXTRACTION PHACO AND INTRAOCULAR LENS PLACEMENT (IOC);  Surgeon: Rutherford Guys, MD;  Location: [facility];  Service: Ophthalmology;  Laterality: Left;  CDE:6.59   CERVICAL DISC SURGERY N/A 2009   4, 6, and 7 cervical disc replaced   CHOLECYSTECTOMY N/A 09/13/2016   Procedure: LAPAROSCOPIC CHOLECYSTECTOMY;  Surgeon: Rolm Bookbinder, MD;  Location: [place];  Service: General;  Laterality: N/A;   COLONOSCOPY WITH PROPOFOL N/A 03/15/2016   Procedure: COLONOSCOPY WITH PROPOFOL;  Surgeon: Gatha Mayer, MD;  Location: [place];  Service: Endoscopy;  Laterality: N/A;   ESOPHAGOGASTRODUODENOSCOPY (EGD) WITH PROPOFOL N/A 03/15/2016   Procedure: ESOPHAGOGASTRODUODENOSCOPY (EGD) WITH PROPOFOL;  Surgeon:  Gatha Mayer, MD;  Location: [place];  Service: Endoscopy;  Laterality: N/A;   HEEL SPUR SURGERY Bilateral    HERNIA REPAIR     I & D EXTREMITY Right 06/13/2015   Procedure: MINOR IRRIGATION AND DEBRIDEMENT EXTREMITY REMOVAL OF NAIL;  Surgeon: Daryll Brod, MD;  Location: [place];  Service: Orthopedics;  Laterality: Right;   TUBAL LIGATION     UMBILICAL HERNIA REPAIR     w/mesh    FAMHx:  Family History  Problem Relation Age of Onset   Heart disease Mother    COPD Mother    Diabetes Mother    Breast cancer Mother    Heart disease Father    Hyperlipidemia Father    COPD Sister    Heart disease Sister    Diabetes Sister    Heart disease Maternal Grandmother    Cancer Maternal Grandmother        stomach   Cancer Maternal Grandfather        lung    Bipolar disorder Brother     SOCHx:   reports that she quit smoking about 31 years ago. Her smoking use included cigarettes. She has a 28.00 pack-year smoking history. She has never used smokeless tobacco. She reports  that she does not drink alcohol and does not use drugs.  ALLERGIES:  Allergies  Allergen Reactions   Oseltamivir Hives    Other reaction(s): Other (see comments) "water blisters"   Xarelto [Rivaroxaban] Other (See Comments)    Internal bleeding   Ancef [Cefazolin] Nausea And Vomiting   Levaquin [Levofloxacin In D5w] Other (See Comments)    "afib"   Lyrica [Pregabalin] Hives   Tamiflu [Oseltamivir Phosphate] Other (See Comments)    "water blisters"   Zoloft [Sertraline Hcl] Other (See Comments)    Jaw problems, jittery   Torsemide Other (See Comments)    Not effective   Augmentin [Amoxicillin-Pot Clavulanate] Itching   Ciprofloxacin Itching and Nausea And Vomiting   Haldol [Haloperidol] Other (See Comments)    Restless leg   Nsaids Diarrhea   Penicillins Itching, Nausea And Vomiting and Rash    Has patient had a PCN reaction causing immediate rash, facial/tongue/throat swelling, SOB or lightheadedness with hypotension: Yes Has patient had a PCN reaction causing severe rash involving mucus membranes or skin necrosis: No Has patient had a PCN reaction that required hospitalization No Has patient had a PCN reaction occurring within the last 10 years: Yes If all of the above answers are "NO", then may proceed with Cephalosporin use.   Topamax [Topiramate] Nausea Only    MEDS:  Current Meds  Medication Sig   albuterol (PROVENTIL) (2.5 MG/3ML) 0.083% nebulizer solution Take 3 mLs (2.5 mg total) by nebulization every 6 (six) hours as needed for wheezing or shortness of breath.   allopurinol (ZYLOPRIM) 100 MG tablet TAKE 1 TABLET BY MOUTH ONCE DAILY. (Patient taking differently: Take 100 mg by mouth daily. )   budesonide (PULMICORT) 0.5 MG/2ML nebulizer solution Take 2 mLs (0.5 mg total) by nebulization 2 (two) times daily.   cholecalciferol (VITAMIN D) 400 units TABS tablet Take 1 tablet (400 Units total) by mouth 2 (two) times daily.   clobetasol cream (TEMOVATE)  0.17 % Apply 1 application topically daily.    colchicine 0.6 MG tablet Take 1 tablet (0.6 mg total) by mouth as needed. (Patient taking differently: Take 0.6 mg by mouth as needed (gout). )   escitalopram (LEXAPRO) 10 MG tablet Take 1 tablet (10 mg  total) by mouth at bedtime.   FEROSUL 325 (65 Fe) MG tablet Take 325 mg by mouth daily.    fluticasone (FLONASE) 50 MCG/ACT nasal spray Place 1 spray into both nostrils 2 (two) times daily.   furosemide (LASIX) 40 MG tablet Take 1 tablet (40 mg total) by mouth 2 (two) times daily.   Insulin Glargine (BASAGLAR KWIKPEN) 100 UNIT/ML Inject 20 Units into the skin 2 (two) times daily.   insulin lispro (HUMALOG) 100 UNIT/ML KiwkPen Inject 0.02 mLs (2 Units total) into the skin 3 (three) times daily before meals.   ipratropium (ATROVENT) 0.02 % nebulizer solution Take 2.5 mLs (0.5 mg total) by nebulization every 6 (six) hours as needed for wheezing or shortness of breath.   metoprolol succinate (TOPROL-XL) 25 MG 24 hr tablet TAKE 1 TABLET BY MOUTH TWICE DAILY (Patient taking differently: Take 25 mg by mouth in the morning and at bedtime. )   mirtazapine (REMERON) 15 MG tablet Take 1 tablet (15 mg total) by mouth at bedtime.   modafinil (PROVIGIL) 200 MG tablet Take 1 tablet (200 mg total) by mouth daily.   montelukast (SINGULAIR) 10 MG tablet Take 1 tablet (10 mg total) by mouth at bedtime.   nitroGLYCERIN (NITROSTAT) 0.4 MG SL tablet Place 0.4 mg under the tongue every 5 (five) minutes as needed for chest pain.    oxyCODONE-acetaminophen (PERCOCET/ROXICET) 5-325 MG tablet Take 1 tablet by mouth 2 (two) times daily as needed for severe pain.   pantoprazole (PROTONIX) 20 MG tablet TAKE (1) TABLET BY MOUTH TWICE A DAY BEFORE MEALS. (BREAKFAST AND SUPPER) (Patient taking differently: Take 20 mg by mouth 2 (two) times daily before a meal. )   primidone (MYSOLINE) 50 MG tablet TAKE ONE TABLET BY MOUTH AT BEDTIME. (Patient taking differently: Take 50  mg by mouth at bedtime. )   Probiotic Product (PROBIOTIC PO) Take 1 tablet by mouth at bedtime.   TRUE METRIX BLOOD GLUCOSE TEST test strip USE TO TEST BLOOD SUGAR AS DIRECTED.   Vitamin A 2400 MCG (8000 UT) CAPS Take 8,000 Units by mouth every morning.    Vitamin D, Ergocalciferol, (DRISDOL) 1.25 MG (50000 UNIT) CAPS capsule Take 1 capsule (50,000 Units total) by mouth every 7 (seven) days.   warfarin (COUMADIN) 4 MG tablet Take 2 tablets tonight, 1 1/2 tablets tomorrow night then increase dose to 1 tablet daily (Patient taking differently: Take 4 mg by mouth daily. )     ROS: Pertinent items noted in HPI and remainder of comprehensive ROS otherwise negative.  Labs/Other Tests and Data Reviewed:    Recent Labs: 03/22/2020: Magnesium 1.6 03/23/2020: B Natriuretic Peptide 925.0; Hemoglobin 11.6; Platelets 222 03/27/2020: BUN 68; Creatinine, Ser 1.99; Potassium 4.6; Sodium 132   Recent Lipid Panel Lab Results  Component Value Date/Time   CHOL 163 07/29/2017 11:15 AM   TRIG 183 (H) 07/29/2017 11:15 AM   HDL 40 07/29/2017 11:15 AM   CHOLHDL 4.1 07/29/2017 11:15 AM   CHOLHDL 6.2 09/10/2016 11:47 AM   LDLCALC 86 07/29/2017 11:15 AM   LDLDIRECT 153.0 04/19/2016 09:32 AM    Wt Readings from Last 3 Encounters:  03/24/20 168 lb 1.6 oz (76.2 kg)  03/20/20 170 lb (77.1 kg)  02/17/20 174 lb (78.9 kg)     Exam:    Vital Signs:  BP (!) 145/80    Exam not performed due to telephone visit  ASSESSMENT & PLAN:    1. Acute on Chronic combined congestive heart failure - EF 30-35% (  improved to 50-55% - 04/2019) 2. Permanent atrial fibrillation - not on anticoagulation due to recent GI bleeding and vision loss attributed to stroke 3. History of stress-induced cardiomyopathy 4. No significant obstructive coronary disease after multiple catheterizations in 1996, 99, 2007 and 2014. 5. Fibromyalgia 6. Bipolar 1 disorder 7. Dyslipidemia 8. Hypertension 9. Severe COPD on home  oxygen 10. Obstructive sleep apnea on CPAP 11. Morbid obesity - with weight loss recently 12. CKD 3B 13. DNR  Ms. venard was hospitalized twice at [facility] in October and says she is still suffering from significant edema.  Creatinine is hovered around 2 consistent with stage IIIb chronic kidney disease.  She was discharged on 40 twice daily Lasix but has been taking 120 twice daily for a period of time without significant improvement.  She was taken off of metolazone which was more helpful for her in the past.  I recommended adding back metolazone 2.5 mg daily 30 to 60 minutes prior to her Lasix.  She will need a repeat metabolic profile next week.  We will work to arrange for a follow-up appoint with Dr. Harl Bowie in [place] since she is just saw him recently at [facility] and it is more convenient for her to follow-up with him since it is more difficult for her to travel to [place].  COVID-19 Education: The signs and symptoms of COVID-19 were discussed with the patient and how to seek care for testing (follow up with PCP or arrange E-visit).  The importance of social distancing was discussed today.  Patient Risk:   After full review of this patients clinical status, I feel that they are at least moderate risk at this time.  Time:   Today, I have spent 25 minutes with the patient with telehealth technology discussing heart failure.     Medication Adjustments/Labs and Tests Ordered: Current medicines are reviewed at length with the patient today.  Concerns regarding medicines are outlined above.   Tests Ordered: No orders of the defined types were placed in this encounter.   Medication Changes: No orders of the defined types were placed in this encounter.   Disposition:  in 1 month(s) with Dr. Harl Bowie in [place]orna Dibble, MD, [facility], [employer] Director of the Advanced Lipid Disorders &  Cardiovascular Risk Reduction Clinic Diplomate  of the American Board of Clinical Lipidology Attending Cardiologist  Direct Dial: [phone]   Fax: [phone]  Website:  [email or web address]  Pixie Casino, MD  04/06/2020 4:54 PM

## 2020-04-06 NOTE — Telephone Encounter (Signed)
Thanks for notifying me - will do a virtual visit later today.   Dr Debara Pickett

## 2020-04-06 NOTE — Telephone Encounter (Addendum)
Patient of [name] who was scheduled for an in-office visit today @ 2:30pm showed up around 12:30pm. She informed check in staff she was told to come in at 1pm. There are no appointment times at 1pm. [name] and staff were at lunch at this time. [name] spoke with patient and explained that she could wait on her scheduled appointment time for 2:30pm and even offered her lunch while she waited. Patient decided to leave the office for today, asked that [name] call her for a virtual visit but she could not do this at 2:30pm as she would not be home, added on to end of clinic day on 04/06/20. She also stated she wanted to transfer care to Denise Freeman for proximity purposes.

## 2020-04-06 NOTE — Patient Instructions (Addendum)
Medication Instructions:  START metolazone 2.5 mg daily, 30-60 minutes before your first dose of Lasix for the day CONTINUE lasix 120 mg (3 of the 40 mg tablets) TWICE A DAY *If you need a refill on your cardiac medications before your next appointment, please call your pharmacy*   Lab Work: BMET in 1 week (paperwork enclosed) If you have labs (blood work) drawn today and your tests are completely normal, you will receive your results only by: Marland Kitchen MyChart Message (if you have MyChart) OR . A paper copy in the mail If you have any lab test that is abnormal or we need to change your treatment, we will call you to review the results.   Testing/Procedures: None ordered   Follow-Up: At [facility], you and your health needs are our priority.  As part of our continuing mission to provide you with exceptional heart care, we have created designated Provider Care Teams.  These Care Teams include your primary Cardiologist (physician) and Advanced Practice Providers (APPs -  Physician Assistants and Nurse Practitioners) who all work together to provide you with the care you need, when you need it.  We recommend signing up for the patient portal called "MyChart".  Sign up information is provided on this After Visit Summary.  MyChart is used to connect with patients for Virtual Visits (Telemedicine).  Patients are able to view lab/test results, encounter notes, upcoming appointments, etc.  Non-urgent messages can be sent to your provider as well.   To learn more about what you can do with MyChart, go to [email or web address].    Your next appointment:   1 week(s) in [place]  The format for your next appointment:   In Person  Provider:   You may Dr. Carlyle Dolly, MD or one of the following Advanced Practice Providers on your designated Care Team:    [place], PA-C   Ermalinda Barrios, PA-C    Other Instructions None

## 2020-04-06 NOTE — Telephone Encounter (Signed)
[name], Denise Freeman are scheduled for a virtual visit with your provider today.    Just as we do with appointments in the office, we must obtain your consent to participate.  Your consent will be active for this visit and any virtual visit you may have with one of our providers in the next 365 days.    If you have a MyChart account, I can also send a copy of this consent to you electronically.  All virtual visits are billed to your insurance company just like a traditional visit in the office.  As this is a virtual visit, video technology does not allow for your provider to perform a traditional examination.  This may limit your provider's ability to fully assess your condition.  If your provider identifies any concerns that need to be evaluated in person or the need to arrange testing such as labs, EKG, etc, we will make arrangements to do so.    Although advances in technology are sophisticated, we cannot ensure that it will always work on either your end or our end.  If the connection with a video visit is poor, we may have to switch to a telephone visit.  With either a video or telephone visit, we are not always able to ensure that we have a secure connection.   I need to obtain your verbal consent now.   Are you willing to proceed with your visit today?   Denise Freeman has provided verbal consent on 04/06/2020 for a virtual visit (video or telephone).   Georgina Pillion, [place] 04/06/2020  3:53 PM

## 2020-04-07 ENCOUNTER — Other Ambulatory Visit: Payer: Self-pay | Admitting: *Deleted

## 2020-04-07 NOTE — Patient Outreach (Signed)
[facility]) Care Management  [place]  04/07/2020   Denise Freeman 1951-04-22 [id number]   Telephone Assessment   Patient is a 69 year old female with PMHX: Combined Systolic and Diastolic Heart Failure, Chronic hypoxic respiratory failure, chronic oxygen therapy at 3 liters OSA with Bipap, Chronic pain syndrome, hypertension, Chronic Atrial Fib, Diabetes( A1c 13.9% on 6/2/21per [facility])Depression with anxiety, partially blind both eyes, hard of hearing .   Inpatient Admission at [facility] 10/18-10/20/21  ( left AMA) Dx: Hyperglycemia  Hyperosmolar non-ketotic state. Inpatient admission [facility]  10/21-10/25/21 Acute Heart Failure.     Outreach Attempt  Subjective:  Unsuccessful outreach call to patient , no answer unable to a HIPAA compliant message for return call.      Care Coordination calls Placed call to Rhae Lerner at [facility] program regarding criteria for patient enrolling in program. Will follow up with patient to explain support benefit of program for enrolling.   Placed call to follow to Dr. Nevada Crane office to  Upstream Pharmacist [place] Constable with on referral placed in last month. .Informed by office that an new upstream pharmacist Tomi F.in the office. Received return call from [place],  explained referral placed due to patient complaint of insulin cost, she states that she is  familiar with patient case and has reached out to patient unsuccessful and plans return call to follow on patient medication assistance needs.    Plan Will plan follow up call to patient in the next 4 business days, if no return call on today.    Joylene Draft, RN, BSN  [place] Management Coordinator  [phone]- Mobile [phone]- Toll Free Main Office

## 2020-04-09 DIAGNOSIS — I482 Chronic atrial fibrillation, unspecified: Secondary | ICD-10-CM | POA: Diagnosis not present

## 2020-04-09 DIAGNOSIS — M545 Low back pain, unspecified: Secondary | ICD-10-CM | POA: Diagnosis not present

## 2020-04-09 DIAGNOSIS — M1000 Idiopathic gout, unspecified site: Secondary | ICD-10-CM | POA: Diagnosis not present

## 2020-04-09 DIAGNOSIS — K219 Gastro-esophageal reflux disease without esophagitis: Secondary | ICD-10-CM | POA: Diagnosis not present

## 2020-04-09 DIAGNOSIS — Z6835 Body mass index (BMI) 35.0-35.9, adult: Secondary | ICD-10-CM | POA: Diagnosis not present

## 2020-04-09 DIAGNOSIS — F411 Generalized anxiety disorder: Secondary | ICD-10-CM | POA: Diagnosis not present

## 2020-04-09 DIAGNOSIS — J9611 Chronic respiratory failure with hypoxia: Secondary | ICD-10-CM | POA: Diagnosis not present

## 2020-04-09 DIAGNOSIS — J449 Chronic obstructive pulmonary disease, unspecified: Secondary | ICD-10-CM | POA: Diagnosis not present

## 2020-04-09 DIAGNOSIS — E6609 Other obesity due to excess calories: Secondary | ICD-10-CM | POA: Diagnosis not present

## 2020-04-09 DIAGNOSIS — N1832 Chronic kidney disease, stage 3b: Secondary | ICD-10-CM | POA: Diagnosis not present

## 2020-04-09 DIAGNOSIS — F319 Bipolar disorder, unspecified: Secondary | ICD-10-CM | POA: Diagnosis not present

## 2020-04-09 DIAGNOSIS — G250 Essential tremor: Secondary | ICD-10-CM | POA: Diagnosis not present

## 2020-04-09 DIAGNOSIS — G4733 Obstructive sleep apnea (adult) (pediatric): Secondary | ICD-10-CM | POA: Diagnosis not present

## 2020-04-09 DIAGNOSIS — D631 Anemia in chronic kidney disease: Secondary | ICD-10-CM | POA: Diagnosis not present

## 2020-04-09 DIAGNOSIS — M797 Fibromyalgia: Secondary | ICD-10-CM | POA: Diagnosis not present

## 2020-04-09 DIAGNOSIS — E876 Hypokalemia: Secondary | ICD-10-CM | POA: Diagnosis not present

## 2020-04-09 DIAGNOSIS — E1165 Type 2 diabetes mellitus with hyperglycemia: Secondary | ICD-10-CM | POA: Diagnosis not present

## 2020-04-09 DIAGNOSIS — E559 Vitamin D deficiency, unspecified: Secondary | ICD-10-CM | POA: Diagnosis not present

## 2020-04-09 DIAGNOSIS — I5023 Acute on chronic systolic (congestive) heart failure: Secondary | ICD-10-CM | POA: Diagnosis not present

## 2020-04-09 DIAGNOSIS — I110 Hypertensive heart disease with heart failure: Secondary | ICD-10-CM | POA: Diagnosis not present

## 2020-04-09 DIAGNOSIS — E782 Mixed hyperlipidemia: Secondary | ICD-10-CM | POA: Diagnosis not present

## 2020-04-09 DIAGNOSIS — G894 Chronic pain syndrome: Secondary | ICD-10-CM | POA: Diagnosis not present

## 2020-04-09 DIAGNOSIS — J302 Other seasonal allergic rhinitis: Secondary | ICD-10-CM | POA: Diagnosis not present

## 2020-04-09 DIAGNOSIS — E1122 Type 2 diabetes mellitus with diabetic chronic kidney disease: Secondary | ICD-10-CM | POA: Diagnosis not present

## 2020-04-10 ENCOUNTER — Other Ambulatory Visit: Payer: Self-pay | Admitting: *Deleted

## 2020-04-10 NOTE — Patient Outreach (Signed)
[facility]) Care Management  04/10/2020  Arline Ketter [facility] 09-Jul-1950 [id number]   Care Coordination call    Received the following email message today from [facility] from call patient made .  : "Calling back to speak with Joelene Millin about missed appointments on Friday, issues with potty chair and needing blood work results and assistance with paperwork."   Subjective: Outreach call to patient unsuccessful , no answer unable to leave a [employer] . I attempted call x 2.    Plan Will plan return call in the next 2 business days.   Joylene Draft, RN, BSN  [place] Management Coordinator  [phone]- Mobile [phone]- Toll Free Main Office

## 2020-04-11 ENCOUNTER — Ambulatory Visit: Payer: Medicare Other | Admitting: Cardiology

## 2020-04-11 NOTE — Progress Notes (Deleted)
Clinical Summary Denise Freeman is a 69 y.o.female  1. Chronic combined systolic/diastolic HF - 78/4696 echo LVEF 50-55%, grade II DDx, mild RV dysfunction - recent admission 03/2020 with volume overload, was diuresed. Home lasix increased to 40mg  bid.  - ongoign issues with edema at discharge. Was taking lasix up to 120mg  bid.  - at tele visit with Dr Debara Pickett metolazone 2.5mg  daily added - from charting torsemide listed as ineffective for her.    2. Permanent afib   3. COPD  - on home O2 Past Medical History:  Diagnosis Date  . Allergy   . Anemia   . Anxiety   . Asthma   . Atrial fibrillation ([place])   . Bipolar 1 disorder ([place])   . 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 Cholelithiasis    a. 09/2016 s/p Lap Chole.  . Chronic bronchitis ([place])   . Chronic combined systolic and diastolic congestive heart failure ([place])    a. 09/2016 Echo: EF 30-35%.  . Colon polyps   . COPD (chronic obstructive pulmonary disease) ([place])   . Depression   . Family history of adverse reaction to anesthesia    Uncle was positive for malignant hyperthermia; patient had testing done and was negative.  . Fibromyalgia   . GERD (gastroesophageal reflux disease)   . Gout   . High cholesterol   . History of blood transfusion 06/2015   "bleeding from my rectum"  . History of hiatal hernia   . HOH (hard of hearing)   . Hx of colonic polyps 03/21/2016   3 small adenomas no recall - co-morbidities  . Neuropathy    Disc Back   . NICM (nonischemic cardiomyopathy) ([place])    a. Previously worked up in [place], [place]-->low EF with subsequent recovery.  Multiple caths (last ~ 2014 per pt report)--reportedly nl cors;  b. 09/2016 Echo: EF 30-35%, antsept/apical HK, mild MR, mildly dil LA, mod dil RA;  c. 09/2016 Lexi MV: EF 26%, glob HK, sept DK, med size, mod intensity fixed septal defect - BBB/PVC related artifact, no ischemia.  . On home oxygen therapy    "3L; 24/7" (03/14/2016)  . OSA treated with  BiPAP    uses biPAP, 10 (03/14/2016)  . Osteoarthritis   . Oxygen deficiency   . Pneumonia   . Type II diabetes mellitus (H[facility])      Allergies  Allergen Reactions  . Oseltamivir Hives    Other reaction(s): Other (see comments) "water blisters"  . Xarelto [Rivaroxaban] Other (See Comments)    Internal bleeding  . Ancef [Cefazolin] Nausea And Vomiting  . Levaquin [Levofloxacin In D5w] Other (See Comments)    "afib"  . Lyrica [Pregabalin] Hives  . Tamiflu [Oseltamivir Phosphate] Other (See Comments)    "water blisters"  . Zoloft [Sertraline Hcl] Other (See Comments)    Jaw problems, jittery  . Torsemide Other (See Comments)    Not effective  . Augmentin [Amoxicillin-Pot Clavulanate] Itching  . Ciprofloxacin Itching and Nausea And Vomiting  . Haldol [Haloperidol] Other (See Comments)    Restless leg  . Nsaids Diarrhea  . Penicillins Itching, Nausea And Vomiting and Rash    Has patient had a PCN reaction causing immediate rash, facial/tongue/throat swelling, SOB or lightheadedness with hypotension: Yes Has patient had a PCN reaction causing severe rash involving mucus membranes or skin necrosis: No Has patient had a PCN reaction that required hospitalization No Has patient had a PCN reaction occurring within the  last 10 years: Yes If all of the above answers are "NO", then may proceed with Cephalosporin use.  . Topamax [Topiramate] Nausea Only     Current Outpatient Medications  Medication Sig Dispense Refill  . albuterol (PROVENTIL) (2.5 MG/3ML) 0.083% nebulizer solution Take 3 mLs (2.5 mg total) by nebulization every 6 (six) hours as needed for wheezing or shortness of breath. 75 mL 5  . allopurinol (ZYLOPRIM) 100 MG tablet TAKE 1 TABLET BY MOUTH ONCE DAILY. (Patient taking differently: Take 100 mg by mouth daily. ) 30 tablet 0  . budesonide (PULMICORT) 0.5 MG/2ML nebulizer solution Take 2 mLs (0.5 mg total) by nebulization 2 (two) times daily. 120 mL 11  . cholecalciferol  (VITAMIN D) 400 units TABS tablet Take 1 tablet (400 Units total) by mouth 2 (two) times daily. 180 each 3  . clobetasol cream (TEMOVATE) 0.30 % Apply 1 application topically daily.     . colchicine 0.6 MG tablet Take 1 tablet (0.6 mg total) by mouth as needed. (Patient taking differently: Take 0.6 mg by mouth as needed (gout). )    . escitalopram (LEXAPRO) 10 MG tablet Take 1 tablet (10 mg total) by mouth at bedtime. 90 tablet 0  . FEROSUL 325 (65 Fe) MG tablet Take 325 mg by mouth daily.     . fluticasone (FLONASE) 50 MCG/ACT nasal spray Place 1 spray into both nostrils 2 (two) times daily.    . furosemide (LASIX) 40 MG tablet Take 3 tablets (120 mg total) by mouth 2 (two) times daily. 180 tablet 3  . Insulin Glargine (BASAGLAR KWIKPEN) 100 UNIT/ML Inject 20 Units into the skin 2 (two) times daily.    . insulin lispro (HUMALOG) 100 UNIT/ML KiwkPen Inject 0.02 mLs (2 Units total) into the skin 3 (three) times daily before meals. 15 mL 11  . ipratropium (ATROVENT) 0.02 % nebulizer solution Take 2.5 mLs (0.5 mg total) by nebulization every 6 (six) hours as needed for wheezing or shortness of breath. 62.5 mL 5  . metolazone (ZAROXOLYN) 2.5 MG tablet Take 1 tablet (2.5 mg total) by mouth daily. 90 tablet 3  . metoprolol succinate (TOPROL-XL) 25 MG 24 hr tablet TAKE 1 TABLET BY MOUTH TWICE DAILY (Patient taking differently: Take 25 mg by mouth in the morning and at bedtime. ) 60 tablet 11  . mirtazapine (REMERON) 15 MG tablet Take 1 tablet (15 mg total) by mouth at bedtime. 90 tablet 0  . modafinil (PROVIGIL) 200 MG tablet Take 1 tablet (200 mg total) by mouth daily. 30 tablet 5  . montelukast (SINGULAIR) 10 MG tablet Take 1 tablet (10 mg total) by mouth at bedtime. 30 tablet 5  . nitroGLYCERIN (NITROSTAT) 0.4 MG SL tablet Place 0.4 mg under the tongue every 5 (five) minutes as needed for chest pain.     Marland Kitchen oxyCODONE-acetaminophen (PERCOCET/ROXICET) 5-325 MG tablet Take 1 tablet by mouth 2 (two) times  daily as needed for severe pain. 60 tablet 0  . pantoprazole (PROTONIX) 20 MG tablet TAKE (1) TABLET BY MOUTH TWICE A DAY BEFORE MEALS. (BREAKFAST AND SUPPER) (Patient taking differently: Take 20 mg by mouth 2 (two) times daily before a meal. ) 56 tablet 0  . primidone (MYSOLINE) 50 MG tablet TAKE ONE TABLET BY MOUTH AT BEDTIME. (Patient taking differently: Take 50 mg by mouth at bedtime. ) 30 tablet 5  . Probiotic Product (PROBIOTIC PO) Take 1 tablet by mouth at bedtime.    . TRUE METRIX BLOOD GLUCOSE TEST test strip USE  TO TEST BLOOD SUGAR AS DIRECTED. 50 each 2  . Vitamin A 2400 MCG (8000 UT) CAPS Take 8,000 Units by mouth every morning.     . Vitamin D, Ergocalciferol, (DRISDOL) 1.25 MG (50000 UNIT) CAPS capsule Take 1 capsule (50,000 Units total) by mouth every 7 (seven) days. 12 capsule 0  . warfarin (COUMADIN) 4 MG tablet Take 2 tablets tonight, 1 1/2 tablets tomorrow night then increase dose to 1 tablet daily (Patient taking differently: Take 4 mg by mouth daily. ) 90 tablet 2   No current facility-administered medications for this visit.     Past Surgical History:  Procedure Laterality Date  . APPENDECTOMY     "they busted"  . bladder stimulator     pt states, "it cannot be turned off; it's in my right hip; dead battery so it's not working anymore". (03/14/2016)  . BLADDER SUSPENSION     2003, 2006 and 2010  . CATARACT EXTRACTION W/PHACO Right 11/29/2014   Procedure: CATARACT EXTRACTION PHACO AND INTRAOCULAR LENS PLACEMENT (IOC);  Surgeon: Rutherford Guys, MD;  Location: [facility];  Service: Ophthalmology;  Laterality: Right;  CDE:3.81  . CATARACT EXTRACTION W/PHACO Left 12/13/2014   Procedure: CATARACT EXTRACTION PHACO AND INTRAOCULAR LENS PLACEMENT (IOC);  Surgeon: Rutherford Guys, MD;  Location: [facility];  Service: Ophthalmology;  Laterality: Left;  CDE:6.59  . CERVICAL DISC SURGERY N/A 2009   4, 6, and 7 cervical disc replaced  . CHOLECYSTECTOMY N/A 09/13/2016   Procedure: LAPAROSCOPIC  CHOLECYSTECTOMY;  Surgeon: Rolm Bookbinder, MD;  Location: [place];  Service: General;  Laterality: N/A;  . COLONOSCOPY WITH PROPOFOL N/A 03/15/2016   Procedure: COLONOSCOPY WITH PROPOFOL;  Surgeon: Gatha Mayer, MD;  Location: [place];  Service: Endoscopy;  Laterality: N/A;  . ESOPHAGOGASTRODUODENOSCOPY (EGD) WITH PROPOFOL N/A 03/15/2016   Procedure: ESOPHAGOGASTRODUODENOSCOPY (EGD) WITH PROPOFOL;  Surgeon: Gatha Mayer, MD;  Location: [place];  Service: Endoscopy;  Laterality: N/A;  . HEEL SPUR SURGERY Bilateral   . HERNIA REPAIR    . I & D EXTREMITY Right 06/13/2015   Procedure: MINOR IRRIGATION AND DEBRIDEMENT EXTREMITY REMOVAL OF NAIL;  Surgeon: Daryll Brod, MD;  Location: [place];  Service: Orthopedics;  Laterality: Right;  . TUBAL LIGATION    . UMBILICAL HERNIA REPAIR     w/mesh     Allergies  Allergen Reactions  . Oseltamivir Hives    Other reaction(s): Other (see comments) "water blisters"  . Xarelto [Rivaroxaban] Other (See Comments)    Internal bleeding  . Ancef [Cefazolin] Nausea And Vomiting  . Levaquin [Levofloxacin In D5w] Other (See Comments)    "afib"  . Lyrica [Pregabalin] Hives  . Tamiflu [Oseltamivir Phosphate] Other (See Comments)    "water blisters"  . Zoloft [Sertraline Hcl] Other (See Comments)    Jaw problems, jittery  . Torsemide Other (See Comments)    Not effective  . Augmentin [Amoxicillin-Pot Clavulanate] Itching  . Ciprofloxacin Itching and Nausea And Vomiting  . Haldol [Haloperidol] Other (See Comments)    Restless leg  . Nsaids Diarrhea  . Penicillins Itching, Nausea And Vomiting and Rash    Has patient had a PCN reaction causing immediate rash, facial/tongue/throat swelling, SOB or lightheadedness with hypotension: Yes Has patient had a PCN reaction causing severe rash involving mucus membranes or skin necrosis: No Has patient had a PCN reaction that required hospitalization No Has patient had a PCN reaction  occurring within the last 10 years: Yes If all of the above answers are "  NO", then may proceed with Cephalosporin use.  . Topamax [Topiramate] Nausea Only      Family History  Problem Relation Age of Onset  . Heart disease Mother   . COPD Mother   . Diabetes Mother   . Breast cancer Mother   . Heart disease Father   . Hyperlipidemia Father   . COPD Sister   . Heart disease Sister   . Diabetes Sister   . Heart disease Maternal Grandmother   . Cancer Maternal Grandmother        stomach  . Cancer Maternal Grandfather        lung   . Bipolar disorder Brother      Social History Ms. Tietje reports that she quit smoking about 31 years ago. Her smoking use included cigarettes. She has a 28.00 pack-year smoking history. She has never used smokeless tobacco. Ms. Boody reports no history of alcohol use.   Review of Systems CONSTITUTIONAL: No weight loss, fever, chills, weakness or fatigue.  HEENT: Eyes: No visual loss, blurred vision, double vision or yellow sclerae.No hearing loss, sneezing, congestion, runny nose or sore throat.  SKIN: No rash or itching.  CARDIOVASCULAR:  RESPIRATORY: No shortness of breath, cough or sputum.  GASTROINTESTINAL: No anorexia, nausea, vomiting or diarrhea. No abdominal pain or blood.  GENITOURINARY: No burning on urination, no polyuria NEUROLOGICAL: No headache, dizziness, syncope, paralysis, ataxia, numbness or tingling in the extremities. No change in bowel or bladder control.  MUSCULOSKELETAL: No muscle, back pain, joint pain or stiffness.  LYMPHATICS: No enlarged nodes. No history of splenectomy.  PSYCHIATRIC: No history of depression or anxiety.  ENDOCRINOLOGIC: No reports of sweating, cold or heat intolerance. No polyuria or polydipsia.  Marland Kitchen   Physical Examination There were no vitals filed for this visit. There were no vitals filed for this visit.  Gen: resting comfortably, no acute distress HEENT: no scleral icterus, pupils equal round  and reactive, no palptable cervical adenopathy,  CV Resp: Clear to auscultation bilaterally GI: abdomen is soft, non-tender, non-distended, normal bowel sounds, no hepatosplenomegaly MSK: extremities are warm, no edema.  Skin: warm, no rash Neuro:  no focal deficits Psych: appropriate affect   Diagnostic Studies     Assessment and Plan        Arnoldo Lenis, M.D., F.A.C.C.

## 2020-04-12 ENCOUNTER — Other Ambulatory Visit: Payer: Self-pay | Admitting: *Deleted

## 2020-04-12 NOTE — Patient Outreach (Signed)
[name]) Care Management  [place]  04/12/2020   Denise Freeman 1950/11/12 [id number]   Telephone Assessment  Chronic Care management to complex care management   Telephone Assessment   Patient is a 69 year old female with PMHX: Combined Systolic and Diastolic Heart Failure, Chronic hypoxic respiratory failure, chronic oxygen therapyat 3 litersOSA with Bipap, Chronic pain syndrome, hypertension,Chronic Atrial Fib,Diabetes( A1c 13.9% on 6/2/21per [facility])Depression with anxiety, partially blind both eyes, hard of hearing .  Inpatient Admission at [facility] 10/18-10/20/21 ( left AMA) Dx: Hyperglycemia Hyperosmolar non-ketotic state. Inpatient admission [facility] 10/21-10/25/21 Acute Heart Failure.    Subjective:  Incoming return call from patient , she reports that she doing pretty good. She reports still having some swelling in lower legs, no worsening in her usual breathing. She reports that her recent weight at home has been 174-176, she discussed having difficulty with her and spouse reading lines on scales. Patient discussed interest in having talking scales again due to her visual problems, reports being unable to afford.  She reports continuing same lasix dose until visit with Dr. Harl Bowie on 11/12, had to change her appointment from 11/9. Patient discussed recent visit with PCP, she has had Libre sensor replaced reports highest reading have been 200's, she is checking 3 times a day or more. She reports receiving contact from pharmacist regarding insulin cost concerns.  Patient discussed plan to be sent monitor to prick finger for INR check at home and call in result to PCP office .  Patient states office has sent in order for bedside commode to [place], she reports being disappointed in not qualifying for a bigger commode chair, she plans follow up with getting a new bucket for her current [facility] as she does want the covered by  insurance plan.  Patient requested assistance with completed form as she requested copy of medical records, patient able to read through assistance provided as best able.    Encounter Medications:  Outpatient Encounter Medications as of 04/12/2020  Medication Sig Note  . albuterol (PROVENTIL) (2.5 MG/3ML) 0.083% nebulizer solution Take 3 mLs (2.5 mg total) by nebulization every 6 (six) hours as needed for wheezing or shortness of breath.   . allopurinol (ZYLOPRIM) 100 MG tablet TAKE 1 TABLET BY MOUTH ONCE DAILY. (Patient taking differently: Take 100 mg by mouth daily. )   . budesonide (PULMICORT) 0.5 MG/2ML nebulizer solution Take 2 mLs (0.5 mg total) by nebulization 2 (two) times daily.   . cholecalciferol (VITAMIN D) 400 units TABS tablet Take 1 tablet (400 Units total) by mouth 2 (two) times daily.   . clobetasol cream (TEMOVATE) 6.76 % Apply 1 application topically daily.    . colchicine 0.6 MG tablet Take 1 tablet (0.6 mg total) by mouth as needed. (Patient taking differently: Take 0.6 mg by mouth as needed (gout). )   . escitalopram (LEXAPRO) 10 MG tablet Take 1 tablet (10 mg total) by mouth at bedtime.   . FEROSUL 325 (65 Fe) MG tablet Take 325 mg by mouth daily.    . fluticasone (FLONASE) 50 MCG/ACT nasal spray Place 1 spray into both nostrils 2 (two) times daily.   . furosemide (LASIX) 40 MG tablet Take 3 tablets (120 mg total) by mouth 2 (two) times daily.   . Insulin Glargine (BASAGLAR KWIKPEN) 100 UNIT/ML Inject 20 Units into the skin 2 (two) times daily.   . insulin lispro (HUMALOG) 100 UNIT/ML KiwkPen Inject 0.02 mLs (2 Units total)  into the skin 3 (three) times daily before meals.   Marland Kitchen ipratropium (ATROVENT) 0.02 % nebulizer solution Take 2.5 mLs (0.5 mg total) by nebulization every 6 (six) hours as needed for wheezing or shortness of breath.   . metolazone (ZAROXOLYN) 2.5 MG tablet Take 1 tablet (2.5 mg total) by mouth daily.   . metoprolol succinate (TOPROL-XL) 25 MG 24 hr tablet  TAKE 1 TABLET BY MOUTH TWICE DAILY (Patient taking differently: Take 25 mg by mouth in the morning and at bedtime. ) 03/22/2020: LF: 03/08/2020 DS:28   . mirtazapine (REMERON) 15 MG tablet Take 1 tablet (15 mg total) by mouth at bedtime. 03/22/2020: LF: 03/08/2020 DS:28   . modafinil (PROVIGIL) 200 MG tablet Take 1 tablet (200 mg total) by mouth daily. 03/22/2020: LF: 03/08/2020 DS:28   . montelukast (SINGULAIR) 10 MG tablet Take 1 tablet (10 mg total) by mouth at bedtime. 03/22/2020: LF: 03/08/2020 DS:28   . nitroGLYCERIN (NITROSTAT) 0.4 MG SL tablet Place 0.4 mg under the tongue every 5 (five) minutes as needed for chest pain.  03/22/2020: Pt hasn't needed  . oxyCODONE-acetaminophen (PERCOCET/ROXICET) 5-325 MG tablet Take 1 tablet by mouth 2 (two) times daily as needed for severe pain. 03/22/2020: LF:03/01/2020 DS:30   . pantoprazole (PROTONIX) 20 MG tablet TAKE (1) TABLET BY MOUTH TWICE A DAY BEFORE MEALS. (BREAKFAST AND SUPPER) (Patient taking differently: Take 20 mg by mouth 2 (two) times daily before a meal. ) 03/22/2020: LF: 03/08/2020 DS:28   . primidone (MYSOLINE) 50 MG tablet TAKE ONE TABLET BY MOUTH AT BEDTIME. (Patient taking differently: Take 50 mg by mouth at bedtime. ) 03/22/2020: LF: 03/08/2020 DS:28   . Probiotic Product (PROBIOTIC PO) Take 1 tablet by mouth at bedtime.   . TRUE METRIX BLOOD GLUCOSE TEST test strip USE TO TEST BLOOD SUGAR AS DIRECTED.   . Vitamin A 2400 MCG (8000 UT) CAPS Take 8,000 Units by mouth every morning.  03/22/2020: LF: 03/08/2020 DS: 28  . Vitamin D, Ergocalciferol, (DRISDOL) 1.25 MG (50000 UNIT) CAPS capsule Take 1 capsule (50,000 Units total) by mouth every 7 (seven) days.   Marland Kitchen warfarin (COUMADIN) 4 MG tablet Take 2 tablets tonight, 1 1/2 tablets tomorrow night then increase dose to 1 tablet daily (Patient taking differently: Take 4 mg by mouth daily. ) 03/22/2020: Per Pharmacy records, warfarin was filled as 1t;po;d LF: 03/08/2020 DS:28  . [DISCONTINUED]  potassium chloride 20 MEQ TBCR Take 20 mEq by mouth 2 (two) times daily.    No facility-administered encounter medications on file as of 04/12/2020.    Functional Status:  In your present state of health, do you have any difficulty performing the following activities: 03/23/2020 03/20/2020  Hearing? [place]? Y -  Comment - -  Difficulty concentrating or making decisions? N -  Walking or climbing stairs? Y -  Comment - -  Dressing or bathing? Y -  Comment - -  Doing errands, shopping? [place] and eating ? - -  Comment - -  Using the Toilet? - -  In the past six months, have you accidently leaked urine? - -  Do you have problems with loss of bowel control? - -  Managing your Medications? - -  Managing your Finances? - -  Comment - -  Housekeeping or managing your Housekeeping? - -  Comment - -  Some recent data might be hidden    Fall/Depression Screening: Fall Risk  02/01/2020 06/11/2019 05/17/2019  Falls in the past year? 1 1 1   Number falls in past yr: 0 1 1  Injury with Fall? 0 1 1  Comment - - -  Risk Factor Category  - - -  Comment - - -  Risk for fall due to : History of fall(s);Impaired balance/gait;Impaired vision History of fall(s) History of fall(s);Impaired vision;Impaired mobility  Risk for fall due to: Comment - - -  Follow up Falls prevention discussed Falls evaluation completed;Education provided Education provided;Falls prevention discussed   PHQ 2/9 Scores 05/17/2019 11/28/2017 11/27/2017 10/03/2017 09/15/2017 09/05/2017 08/07/2017  PHQ - 2 Score 0 0 5 6 0 0 0  PHQ- 9 Score - - - 23 - - -    Assessment:  Goals Addressed            This Visit's Progress   . Make and Keep All Appointments   On track    Follow Up Date 04/12/20   - call to cancel if needed - keep a calendar with appointment dates Reschedule missed appointments     Why is this important?   Part of staying healthy is seeing the doctor for follow-up  care.  If you forget your appointments, there are some things you can do to stay on track.    Notes:     Marland Kitchen Manage My Medicine   On track    Follow Up Date 04/06/20   - call for medicine refill 2 or 3 days before it runs out  - take medications as prescribed per most recent after visit summary   Why is this important?   These steps will help you keep on track with your medicines.    Notes:  Pharmacy referral due to cost of insulins    . Monitor and Manage My Blood Sugar   On track    Follow Up Date 04/04/20   - check blood sugar at prescribed times - check blood sugar if I feel it is too high or too low - take the blood sugar meter to all doctor visits  Replace libre sensor to monitor blood sugars - with spouse assistance    Why is this important?   Checking your blood sugar at home helps to keep it from getting very high or very low.  Writing the results in a diary or log helps the doctor know how to care for you.  Your blood sugar log should have the time, date and the results.  Also, write down the amount of insulin or other medicine that you take.  Other information, like what you ate, exercise done and how you were feeling, will also be helpful.     Notes:     . Obtain Eye Exam   On track    Follow Up Date11/30/21   - keep appointment with eye doctor  Rescheduled missed appointments    Why is this important?   Eye check-ups are important when you have diabetes.  Vision loss can be prevented.    Notes:     . Perform Foot Care   On track    Follow Up Date 05/02/20   - check feet daily for cuts, sores or redness - wash and dry feet carefully every day - wear comfortable, cotton socks - wear comfortable, well-fitting shoes    Why is this important?   Good foot care is very important when you have diabetes.  There are many things you can do to keep your feet healthy  and catch a problem early.    Notes:  Encourage spouse to assist with observance of feet and  with care     . Set My Target A1C   Not on track    Follow Up Date 04/04/20   - set target A1C    Why is this important?   Your target A1C is decided together by you and your doctor.  It is based on several things like your age and other health issues.    Notes:  Call to PCP office regarding Ac1    . Track and Manage Fluids and Swelling   On track    Follow Up Date 05/02/20   - call office if I gain more than 2 pounds in one day or 5 pounds in one week - do ankle pumps when sitting - keep legs up while sitting - track weight in diary - watch for swelling in feet, ankles and legs every day - weigh myself daily    Why is this important?   It is important to check your weight daily and watch how much salt and liquids you have.  It will help you to manage your heart failure.    Notes:     . Track and Manage Symptoms   On track    Follow Up Date 05/02/20   - bring diary to all appointments - follow rescue plan if symptoms flare-up - know when to call the doctor - dress right for the weather, hot or cold    Why is this important?   You will be able to handle your symptoms better if you keep track of them.  Making some simple changes to your lifestyle will help.  Eating healthy is one thing you can do to take good care of yourself.    Notes:        Heart Failure  Patient having difficulty with reading scales at home, will benefit from digital type scales-. Cardiology appointment locally , family to provide transportation. Discussed paramedicine program in [facility] , patient is interested if no cost.  Diabetes Libre Sensor in place patient able to monitor blood sugars as recommended.  [facility] pharmacist following for concerns related to insulin cost.   Plan:  Will plan follow up call in the next week.  Follow up email to  [facility] with Paramedicine program in [facility] and PCP if needed approval.  Will follow up with [facility] resources for talking scale for  daily weight monitoring.   Joylene Draft, RN, BSN  [place] Management Coordinator  [phone]- Mobile [phone]- Toll Free Main Office

## 2020-04-14 ENCOUNTER — Ambulatory Visit: Payer: Medicare Other | Admitting: Cardiology

## 2020-04-14 DIAGNOSIS — I4821 Permanent atrial fibrillation: Secondary | ICD-10-CM | POA: Diagnosis not present

## 2020-04-14 DIAGNOSIS — I5023 Acute on chronic systolic (congestive) heart failure: Secondary | ICD-10-CM | POA: Diagnosis not present

## 2020-04-14 DIAGNOSIS — E1122 Type 2 diabetes mellitus with diabetic chronic kidney disease: Secondary | ICD-10-CM | POA: Diagnosis not present

## 2020-04-14 DIAGNOSIS — N1832 Chronic kidney disease, stage 3b: Secondary | ICD-10-CM | POA: Diagnosis not present

## 2020-04-14 DIAGNOSIS — I110 Hypertensive heart disease with heart failure: Secondary | ICD-10-CM | POA: Diagnosis not present

## 2020-04-14 DIAGNOSIS — Z7901 Long term (current) use of anticoagulants: Secondary | ICD-10-CM | POA: Diagnosis not present

## 2020-04-14 DIAGNOSIS — D631 Anemia in chronic kidney disease: Secondary | ICD-10-CM | POA: Diagnosis not present

## 2020-04-14 DIAGNOSIS — I482 Chronic atrial fibrillation, unspecified: Secondary | ICD-10-CM | POA: Diagnosis not present

## 2020-04-14 NOTE — Progress Notes (Deleted)
Clinical Summary Denise Freeman is a 69 y.o.female  1. Chronic combined systolic/diastolic HF - 29/5284 echo LVEF 50-55%, grade II DDx, mild RV dysfunction - recent admission 03/2020 with volume overload, was diuresed. Home lasix increased to 40mg  bid.  - ongoign issues with edema at discharge. Was taking lasix up to 120mg  bid.  - at tele visit with Dr Debara Pickett metolazone 2.5mg  daily added - from charting torsemide listed as ineffective for her.    2. Permanent afib   3. COPD  - on home O2 Past Medical History:  Diagnosis Date   Allergy    Anemia    Anxiety    Asthma    Atrial fibrillation ([place])    Bipolar 1 disorder ([place])    Blind    "partially in both eyes" (03/14/2016)   Cholelithiasis    a. 09/2016 s/p Lap Chole.   Chronic bronchitis (HCC)    Chronic combined systolic and diastolic congestive heart failure ([place])    a. 09/2016 Echo: EF 30-35%.   Colon polyps    COPD (chronic obstructive pulmonary disease) (HCC)    Depression    Family history of adverse reaction to anesthesia    Uncle was positive for malignant hyperthermia; patient had testing done and was negative.   Fibromyalgia    GERD (gastroesophageal reflux disease)    Gout    High cholesterol    History of blood transfusion 06/2015   "bleeding from my rectum"   History of hiatal hernia    HOH (hard of hearing)    Hx of colonic polyps 03/21/2016   3 small adenomas no recall - co-morbidities   Neuropathy    Disc Back    NICM (nonischemic cardiomyopathy) ([place])    a. Previously worked up in [place], [place]-->low EF with subsequent recovery.  Multiple caths (last ~ 2014 per pt report)--reportedly nl cors;  b. 09/2016 Echo: EF 30-35%, antsept/apical HK, mild MR, mildly dil LA, mod dil RA;  c. 09/2016 Lexi MV: EF 26%, glob HK, sept DK, med size, mod intensity fixed septal defect - BBB/PVC related artifact, no ischemia.   On home oxygen therapy    "3L; 24/7" (03/14/2016)   OSA treated  with BiPAP    uses biPAP, 10 (03/14/2016)   Osteoarthritis    Oxygen deficiency    Pneumonia    Type II diabetes mellitus (HCC)      Allergies  Allergen Reactions   Oseltamivir Hives    Other reaction(s): Other (see comments) "water blisters"   Xarelto [Rivaroxaban] Other (See Comments)    Internal bleeding   Ancef [Cefazolin] Nausea And Vomiting   Levaquin [Levofloxacin In D5w] Other (See Comments)    "afib"   Lyrica [Pregabalin] Hives   Tamiflu [Oseltamivir Phosphate] Other (See Comments)    "water blisters"   Zoloft [Sertraline Hcl] Other (See Comments)    Jaw problems, jittery   Torsemide Other (See Comments)    Not effective   Augmentin [Amoxicillin-Pot Clavulanate] Itching   Ciprofloxacin Itching and Nausea And Vomiting   Haldol [Haloperidol] Other (See Comments)    Restless leg   Nsaids Diarrhea   Penicillins Itching, Nausea And Vomiting and Rash    Has patient had a PCN reaction causing immediate rash, facial/tongue/throat swelling, SOB or lightheadedness with hypotension: Yes Has patient had a PCN reaction causing severe rash involving mucus membranes or skin necrosis: No Has patient had a PCN reaction that required hospitalization No Has patient had a PCN reaction occurring within the  last 10 years: Yes If all of the above answers are "NO", then may proceed with Cephalosporin use.   Topamax [Topiramate] Nausea Only     Current Outpatient Medications  Medication Sig Dispense Refill   albuterol (PROVENTIL) (2.5 MG/3ML) 0.083% nebulizer solution Take 3 mLs (2.5 mg total) by nebulization every 6 (six) hours as needed for wheezing or shortness of breath. 75 mL 5   allopurinol (ZYLOPRIM) 100 MG tablet TAKE 1 TABLET BY MOUTH ONCE DAILY. (Patient taking differently: Take 100 mg by mouth daily. ) 30 tablet 0   budesonide (PULMICORT) 0.5 MG/2ML nebulizer solution Take 2 mLs (0.5 mg total) by nebulization 2 (two) times daily. 120 mL 11    cholecalciferol (VITAMIN D) 400 units TABS tablet Take 1 tablet (400 Units total) by mouth 2 (two) times daily. 180 each 3   clobetasol cream (TEMOVATE) 4.26 % Apply 1 application topically daily.      colchicine 0.6 MG tablet Take 1 tablet (0.6 mg total) by mouth as needed. (Patient taking differently: Take 0.6 mg by mouth as needed (gout). )     escitalopram (LEXAPRO) 10 MG tablet Take 1 tablet (10 mg total) by mouth at bedtime. 90 tablet 0   FEROSUL 325 (65 Fe) MG tablet Take 325 mg by mouth daily.      fluticasone (FLONASE) 50 MCG/ACT nasal spray Place 1 spray into both nostrils 2 (two) times daily.     furosemide (LASIX) 40 MG tablet Take 3 tablets (120 mg total) by mouth 2 (two) times daily. 180 tablet 3   Insulin Glargine (BASAGLAR KWIKPEN) 100 UNIT/ML Inject 20 Units into the skin 2 (two) times daily.     insulin lispro (HUMALOG) 100 UNIT/ML KiwkPen Inject 0.02 mLs (2 Units total) into the skin 3 (three) times daily before meals. 15 mL 11   ipratropium (ATROVENT) 0.02 % nebulizer solution Take 2.5 mLs (0.5 mg total) by nebulization every 6 (six) hours as needed for wheezing or shortness of breath. 62.5 mL 5   metolazone (ZAROXOLYN) 2.5 MG tablet Take 1 tablet (2.5 mg total) by mouth daily. 90 tablet 3   metoprolol succinate (TOPROL-XL) 25 MG 24 hr tablet TAKE 1 TABLET BY MOUTH TWICE DAILY (Patient taking differently: Take 25 mg by mouth in the morning and at bedtime. ) 60 tablet 11   mirtazapine (REMERON) 15 MG tablet Take 1 tablet (15 mg total) by mouth at bedtime. 90 tablet 0   modafinil (PROVIGIL) 200 MG tablet Take 1 tablet (200 mg total) by mouth daily. 30 tablet 5   montelukast (SINGULAIR) 10 MG tablet Take 1 tablet (10 mg total) by mouth at bedtime. 30 tablet 5   nitroGLYCERIN (NITROSTAT) 0.4 MG SL tablet Place 0.4 mg under the tongue every 5 (five) minutes as needed for chest pain.      oxyCODONE-acetaminophen (PERCOCET/ROXICET) 5-325 MG tablet Take 1 tablet by mouth 2  (two) times daily as needed for severe pain. 60 tablet 0   pantoprazole (PROTONIX) 20 MG tablet TAKE (1) TABLET BY MOUTH TWICE A DAY BEFORE MEALS. (BREAKFAST AND SUPPER) (Patient taking differently: Take 20 mg by mouth 2 (two) times daily before a meal. ) 56 tablet 0   primidone (MYSOLINE) 50 MG tablet TAKE ONE TABLET BY MOUTH AT BEDTIME. (Patient taking differently: Take 50 mg by mouth at bedtime. ) 30 tablet 5   Probiotic Product (PROBIOTIC PO) Take 1 tablet by mouth at bedtime.     TRUE METRIX BLOOD GLUCOSE TEST test strip USE  TO TEST BLOOD SUGAR AS DIRECTED. 50 each 2   Vitamin A 2400 MCG (8000 UT) CAPS Take 8,000 Units by mouth every morning.      Vitamin D, Ergocalciferol, (DRISDOL) 1.25 MG (50000 UNIT) CAPS capsule Take 1 capsule (50,000 Units total) by mouth every 7 (seven) days. 12 capsule 0   warfarin (COUMADIN) 4 MG tablet Take 2 tablets tonight, 1 1/2 tablets tomorrow night then increase dose to 1 tablet daily (Patient taking differently: Take 4 mg by mouth daily. ) 90 tablet 2   No current facility-administered medications for this visit.     Past Surgical History:  Procedure Laterality Date   APPENDECTOMY     "they busted"   bladder stimulator     pt states, "it cannot be turned off; it's in my right hip; dead battery so it's not working anymore". (03/14/2016)   BLADDER SUSPENSION     2003, 2006 and 2010   CATARACT EXTRACTION W/PHACO Right 11/29/2014   Procedure: CATARACT EXTRACTION PHACO AND INTRAOCULAR LENS PLACEMENT (IOC);  Surgeon: Rutherford Guys, MD;  Location: [facility];  Service: Ophthalmology;  Laterality: Right;  CDE:3.81   CATARACT EXTRACTION W/PHACO Left 12/13/2014   Procedure: CATARACT EXTRACTION PHACO AND INTRAOCULAR LENS PLACEMENT (IOC);  Surgeon: Rutherford Guys, MD;  Location: [facility];  Service: Ophthalmology;  Laterality: Left;  CDE:6.59   CERVICAL DISC SURGERY N/A 2009   4, 6, and 7 cervical disc replaced   CHOLECYSTECTOMY N/A 09/13/2016   Procedure:  LAPAROSCOPIC CHOLECYSTECTOMY;  Surgeon: Rolm Bookbinder, MD;  Location: [place];  Service: General;  Laterality: N/A;   COLONOSCOPY WITH PROPOFOL N/A 03/15/2016   Procedure: COLONOSCOPY WITH PROPOFOL;  Surgeon: Gatha Mayer, MD;  Location: [place];  Service: Endoscopy;  Laterality: N/A;   ESOPHAGOGASTRODUODENOSCOPY (EGD) WITH PROPOFOL N/A 03/15/2016   Procedure: ESOPHAGOGASTRODUODENOSCOPY (EGD) WITH PROPOFOL;  Surgeon: Gatha Mayer, MD;  Location: [place];  Service: Endoscopy;  Laterality: N/A;   HEEL SPUR SURGERY Bilateral    HERNIA REPAIR     I & D EXTREMITY Right 06/13/2015   Procedure: MINOR IRRIGATION AND DEBRIDEMENT EXTREMITY REMOVAL OF NAIL;  Surgeon: Daryll Brod, MD;  Location: [place];  Service: Orthopedics;  Laterality: Right;   TUBAL LIGATION     UMBILICAL HERNIA REPAIR     w/mesh     Allergies  Allergen Reactions   Oseltamivir Hives    Other reaction(s): Other (see comments) "water blisters"   Xarelto [Rivaroxaban] Other (See Comments)    Internal bleeding   Ancef [Cefazolin] Nausea And Vomiting   Levaquin [Levofloxacin In D5w] Other (See Comments)    "afib"   Lyrica [Pregabalin] Hives   Tamiflu [Oseltamivir Phosphate] Other (See Comments)    "water blisters"   Zoloft [Sertraline Hcl] Other (See Comments)    Jaw problems, jittery   Torsemide Other (See Comments)    Not effective   Augmentin [Amoxicillin-Pot Clavulanate] Itching   Ciprofloxacin Itching and Nausea And Vomiting   Haldol [Haloperidol] Other (See Comments)    Restless leg   Nsaids Diarrhea   Penicillins Itching, Nausea And Vomiting and Rash    Has patient had a PCN reaction causing immediate rash, facial/tongue/throat swelling, SOB or lightheadedness with hypotension: Yes Has patient had a PCN reaction causing severe rash involving mucus membranes or skin necrosis: No Has patient had a PCN reaction that required hospitalization No Has patient had a PCN  reaction occurring within the last 10 years: Yes If all of the above answers are "  NO", then may proceed with Cephalosporin use.   Topamax [Topiramate] Nausea Only      Family History  Problem Relation Age of Onset   Heart disease Mother    COPD Mother    Diabetes Mother    Breast cancer Mother    Heart disease Father    Hyperlipidemia Father    COPD Sister    Heart disease Sister    Diabetes Sister    Heart disease Maternal Grandmother    Cancer Maternal Grandmother        stomach   Cancer Maternal Grandfather        lung    Bipolar disorder Brother      Social History Denise Freeman reports that she quit smoking about 31 years ago. Her smoking use included cigarettes. She has a 28.00 pack-year smoking history. She has never used smokeless tobacco. Denise Freeman reports no history of alcohol use.   Review of Systems CONSTITUTIONAL: No weight loss, fever, chills, weakness or fatigue.  HEENT: Eyes: No visual loss, blurred vision, double vision or yellow sclerae.No hearing loss, sneezing, congestion, runny nose or sore throat.  SKIN: No rash or itching.  CARDIOVASCULAR:  RESPIRATORY: No shortness of breath, cough or sputum.  GASTROINTESTINAL: No anorexia, nausea, vomiting or diarrhea. No abdominal pain or blood.  GENITOURINARY: No burning on urination, no polyuria NEUROLOGICAL: No headache, dizziness, syncope, paralysis, ataxia, numbness or tingling in the extremities. No change in bowel or bladder control.  MUSCULOSKELETAL: No muscle, back pain, joint pain or stiffness.  LYMPHATICS: No enlarged nodes. No history of splenectomy.  PSYCHIATRIC: No history of depression or anxiety.  ENDOCRINOLOGIC: No reports of sweating, cold or heat intolerance. No polyuria or polydipsia.  Marland Kitchen   Physical Examination There were no vitals filed for this visit. There were no vitals filed for this visit.  Gen: resting comfortably, no acute distress HEENT: no scleral icterus, pupils  equal round and reactive, no palptable cervical adenopathy,  CV Resp: Clear to auscultation bilaterally GI: abdomen is soft, non-tender, non-distended, normal bowel sounds, no hepatosplenomegaly MSK: extremities are warm, no edema.  Skin: warm, no rash Neuro:  no focal deficits Psych: appropriate affect   Diagnostic Studies     Assessment and Plan        Arnoldo Lenis, M.D., F.A.C.C.

## 2020-04-17 ENCOUNTER — Telehealth: Payer: Self-pay | Admitting: Cardiology

## 2020-04-17 ENCOUNTER — Encounter: Payer: Self-pay | Admitting: Cardiology

## 2020-04-17 ENCOUNTER — Ambulatory Visit: Payer: Medicare Other | Admitting: General Practice

## 2020-04-17 DIAGNOSIS — N1832 Chronic kidney disease, stage 3b: Secondary | ICD-10-CM | POA: Diagnosis not present

## 2020-04-17 DIAGNOSIS — E1122 Type 2 diabetes mellitus with diabetic chronic kidney disease: Secondary | ICD-10-CM | POA: Diagnosis not present

## 2020-04-17 DIAGNOSIS — D631 Anemia in chronic kidney disease: Secondary | ICD-10-CM | POA: Diagnosis not present

## 2020-04-17 DIAGNOSIS — I482 Chronic atrial fibrillation, unspecified: Secondary | ICD-10-CM | POA: Diagnosis not present

## 2020-04-17 DIAGNOSIS — I110 Hypertensive heart disease with heart failure: Secondary | ICD-10-CM | POA: Diagnosis not present

## 2020-04-17 DIAGNOSIS — I5023 Acute on chronic systolic (congestive) heart failure: Secondary | ICD-10-CM | POA: Diagnosis not present

## 2020-04-17 NOTE — Telephone Encounter (Signed)
Called and left a message on patient's phone. Need to try and reschedule her appointment with Dr. Harl Bowie.

## 2020-04-18 ENCOUNTER — Ambulatory Visit: Payer: Medicare Other | Admitting: Internal Medicine

## 2020-04-18 ENCOUNTER — Other Ambulatory Visit: Payer: Self-pay | Admitting: *Deleted

## 2020-04-18 DIAGNOSIS — E1122 Type 2 diabetes mellitus with diabetic chronic kidney disease: Secondary | ICD-10-CM | POA: Diagnosis not present

## 2020-04-18 DIAGNOSIS — N1832 Chronic kidney disease, stage 3b: Secondary | ICD-10-CM | POA: Diagnosis not present

## 2020-04-18 DIAGNOSIS — I110 Hypertensive heart disease with heart failure: Secondary | ICD-10-CM | POA: Diagnosis not present

## 2020-04-18 DIAGNOSIS — I5023 Acute on chronic systolic (congestive) heart failure: Secondary | ICD-10-CM | POA: Diagnosis not present

## 2020-04-18 DIAGNOSIS — I482 Chronic atrial fibrillation, unspecified: Secondary | ICD-10-CM | POA: Diagnosis not present

## 2020-04-18 DIAGNOSIS — D631 Anemia in chronic kidney disease: Secondary | ICD-10-CM | POA: Diagnosis not present

## 2020-04-18 NOTE — Patient Outreach (Signed)
[facility]) Care Management  [place]  04/18/2020   Denise Freeman 1950-12-10 [id number]    Patient is a 69 year old female with PMHX: Combined Systolic and Diastolic Heart Failure, Chronic hypoxic respiratory failure, chronic oxygen therapyat 3 litersOSA with Bipap, Chronic pain syndrome, hypertension,Chronic Atrial Fib,Diabetes( A1c 13.9% on 6/2/21per [facility])Depression with anxiety, partially blind both eyes, hard of hearing .  Inpatient Admission at [facility] 10/18-10/20/21 ( left AMA) Dx: Hyperglycemia Hyperosmolar non-ketotic state. Inpatient admission [facility] 10/21-10/25/21 Acute Heart Failure.    Subjective:  Successful follow up call on outreach 2nd outreach call on today.  Patient states that she is feeling fair on today. Patient discussed now having [facility] RN and physical  therapist visiting .She also discussed some interest in palliative care program after home health services completed as also mentioned by home health RN  Patient discussed having received monitor at home for monitoring her INR she reports having planned visit from [place] RN to help with educating on use of monitor and calling in results for coumadin dosing.   Patient reports still having some swelling in legs/ankle but weight at 168 for last 2 days. Patient verifies receiving [facility] calendar for keeping a log of weight. Patient asked about possibility of getting talking scales for weight monitoring, informed patient that I will send message to [facility] office regarding scales.   Patient denies increase in shortness of breath or cough states her allergies are bothering her more on today.   Patient reports that her blood sugars are doing some better with readings today 165 and 158, she denies low blood sugars episodes.    Encounter Medications:  Outpatient Encounter Medications as of 04/18/2020  Medication Sig Note   albuterol (PROVENTIL) (2.5 MG/3ML)  0.083% nebulizer solution Take 3 mLs (2.5 mg total) by nebulization every 6 (six) hours as needed for wheezing or shortness of breath.    allopurinol (ZYLOPRIM) 100 MG tablet TAKE 1 TABLET BY MOUTH ONCE DAILY. (Patient taking differently: Take 100 mg by mouth daily. )    budesonide (PULMICORT) 0.5 MG/2ML nebulizer solution Take 2 mLs (0.5 mg total) by nebulization 2 (two) times daily.    cholecalciferol (VITAMIN D) 400 units TABS tablet Take 1 tablet (400 Units total) by mouth 2 (two) times daily.    clobetasol cream (TEMOVATE) 4.78 % Apply 1 application topically daily.     colchicine 0.6 MG tablet Take 1 tablet (0.6 mg total) by mouth as needed. (Patient taking differently: Take 0.6 mg by mouth as needed (gout). )    escitalopram (LEXAPRO) 10 MG tablet Take 1 tablet (10 mg total) by mouth at bedtime.    FEROSUL 325 (65 Fe) MG tablet Take 325 mg by mouth daily.     fluticasone (FLONASE) 50 MCG/ACT nasal spray Place 1 spray into both nostrils 2 (two) times daily.    furosemide (LASIX) 40 MG tablet Take 3 tablets (120 mg total) by mouth 2 (two) times daily.    Insulin Glargine (BASAGLAR KWIKPEN) 100 UNIT/ML Inject 20 Units into the skin 2 (two) times daily.    insulin lispro (HUMALOG) 100 UNIT/ML KiwkPen Inject 0.02 mLs (2 Units total) into the skin 3 (three) times daily before meals.    ipratropium (ATROVENT) 0.02 % nebulizer solution Take 2.5 mLs (0.5 mg total) by nebulization every 6 (six) hours as needed for wheezing or shortness of breath.    metolazone (ZAROXOLYN) 2.5 MG tablet Take 1 tablet (2.5 mg total)  by mouth daily.    metoprolol succinate (TOPROL-XL) 25 MG 24 hr tablet TAKE 1 TABLET BY MOUTH TWICE DAILY (Patient taking differently: Take 25 mg by mouth in the morning and at bedtime. ) 03/22/2020: LF: 03/08/2020 DS:28    mirtazapine (REMERON) 15 MG tablet Take 1 tablet (15 mg total) by mouth at bedtime. 03/22/2020: LF: 03/08/2020 DS:28    modafinil (PROVIGIL) 200 MG tablet  Take 1 tablet (200 mg total) by mouth daily. 03/22/2020: LF: 03/08/2020 DS:28    montelukast (SINGULAIR) 10 MG tablet Take 1 tablet (10 mg total) by mouth at bedtime. 03/22/2020: LF: 03/08/2020 DS:28    nitroGLYCERIN (NITROSTAT) 0.4 MG SL tablet Place 0.4 mg under the tongue every 5 (five) minutes as needed for chest pain.  03/22/2020: Pt hasn't needed   oxyCODONE-acetaminophen (PERCOCET/ROXICET) 5-325 MG tablet Take 1 tablet by mouth 2 (two) times daily as needed for severe pain. 03/22/2020: LF:03/01/2020 DS:30    pantoprazole (PROTONIX) 20 MG tablet TAKE (1) TABLET BY MOUTH TWICE A DAY BEFORE MEALS. (BREAKFAST AND SUPPER) (Patient taking differently: Take 20 mg by mouth 2 (two) times daily before a meal. ) 03/22/2020: LF: 03/08/2020 DS:28    primidone (MYSOLINE) 50 MG tablet TAKE ONE TABLET BY MOUTH AT BEDTIME. (Patient taking differently: Take 50 mg by mouth at bedtime. ) 03/22/2020: LF: 03/08/2020 DS:28    Probiotic Product (PROBIOTIC PO) Take 1 tablet by mouth at bedtime.    TRUE METRIX BLOOD GLUCOSE TEST test strip USE TO TEST BLOOD SUGAR AS DIRECTED.    Vitamin A 2400 MCG (8000 UT) CAPS Take 8,000 Units by mouth every morning.  03/22/2020: LF: 03/08/2020 DS: 28   Vitamin D, Ergocalciferol, (DRISDOL) 1.25 MG (50000 UNIT) CAPS capsule Take 1 capsule (50,000 Units total) by mouth every 7 (seven) days.    warfarin (COUMADIN) 4 MG tablet Take 2 tablets tonight, 1 1/2 tablets tomorrow night then increase dose to 1 tablet daily (Patient taking differently: Take 4 mg by mouth daily. ) 03/22/2020: Per Pharmacy records, warfarin was filled as 1t;po;d LF: 03/08/2020 DS:28   [DISCONTINUED] potassium chloride 20 MEQ TBCR Take 20 mEq by mouth 2 (two) times daily.    No facility-administered encounter medications on file as of 04/18/2020.    Functional Status:  In your present state of health, do you have any difficulty performing the following activities: 03/23/2020 03/20/2020  Hearing? [place]? Y -  Comment - -  Difficulty concentrating or making decisions? N -  Walking or climbing stairs? Y -  Comment - -  Dressing or bathing? Y -  Comment - -  Doing errands, shopping? [place] and eating ? - -  Comment - -  Using the Toilet? - -  In the past six months, have you accidently leaked urine? - -  Do you have problems with loss of bowel control? - -  Managing your Medications? - -  Managing your Finances? - -  Comment - -  Housekeeping or managing your Housekeeping? - -  Comment - -  Some recent data might be hidden    Fall/Depression Screening: Fall Risk  02/01/2020 06/11/2019 05/17/2019  Falls in the past year? 1 1 1   Number falls in past yr: 0 1 1  Injury with Fall? 0 1 1  Comment - - -  Risk Factor Category  - - -  Comment - - -  Risk for fall due to :  History of fall(s);Impaired balance/gait;Impaired vision History of fall(s) History of fall(s);Impaired vision;Impaired mobility  Risk for fall due to: Comment - - -  Follow up Falls prevention discussed Falls evaluation completed;Education provided Education provided;Falls prevention discussed   PHQ 2/9 Scores 05/17/2019 11/28/2017 11/27/2017 10/03/2017 09/15/2017 09/05/2017 08/07/2017  PHQ - 2 Score 0 0 5 6 0 0 0  PHQ- 9 Score - - - 23 - - -    Assessment:  Goals Addressed            This Visit's Progress    Make and Keep All Appointments   On track    Follow Up Date 04/12/20   - call to cancel if needed - keep a calendar with appointment dates Reschedule missed appointments     Why is this important?   Part of staying healthy is seeing the doctor for follow-up care.  If you forget your appointments, there are some things you can do to stay on track.    Notes:      Manage My Medicine   On track    Follow Up Date 04/06/20   - call for medicine refill 2 or 3 days before it runs out  - take medications as prescribed per most recent after visit summary   Why  is this important?   These steps will help you keep on track with your medicines.    Notes:  Pharmacy referral due to cost of insulins     Monitor and Manage My Blood Sugar   On track    Follow Up Date 04/04/20   - check blood sugar at prescribed times - check blood sugar if I feel it is too high or too low - take the blood sugar meter to all doctor visits  Replace libre sensor to monitor blood sugars - with spouse assistance    Why is this important?   Checking your blood sugar at home helps to keep it from getting very high or very low.  Writing the results in a diary or log helps the doctor know how to care for you.  Your blood sugar log should have the time, date and the results.  Also, write down the amount of insulin or other medicine that you take.  Other information, like what you ate, exercise done and how you were feeling, will also be helpful.     Notes:      Obtain Eye Exam   On track    Follow Up Date11/30/21   - keep appointment with eye doctor  Rescheduled missed appointments    Why is this important?   Eye check-ups are important when you have diabetes.  Vision loss can be prevented.    Notes:      Perform Foot Care   Not on track    Follow Up Date 05/02/20   - check feet daily for cuts, sores or redness - wash and dry feet carefully every day - wear comfortable, cotton socks - wear comfortable, well-fitting shoes    Why is this important?   Good foot care is very important when you have diabetes.  There are many things you can do to keep your feet healthy and catch a problem early.    Notes:  Encourage spouse to assist with observance of feet and with care      Set My Target A1C   Not on track    Follow Up Date 04/04/20   - set target A1C    Why  is this important?   Your target A1C is decided together by you and your doctor.  It is based on several things like your age and other health issues.    Notes:  Call to PCP office regarding Ac1      Track and Manage Activity and Exertion   On track    Follow Up Date 04/18/20   - pace activity allowing for rest  -participate with home health therapy    Why is this important?   Exercising is very important when managing your heart failure.  It will help your heart get stronger.    Notes:  Fall safety, use of walker, bedside commode      Track and Manage Fluids and Swelling   On track    Follow Up Date 05/02/20   - call office if I gain more than 2 pounds in one day or 5 pounds in one week - do ankle pumps when sitting - keep legs up while sitting - track weight in diary - watch for swelling in feet, ankles and legs every day - weigh myself daily    Why is this important?   It is important to check your weight daily and watch how much salt and liquids you have.  It will help you to manage your heart failure.    Notes:      Track and Manage Symptoms   On track    Follow Up Date 05/02/20   - bring diary to all appointments - follow rescue plan if symptoms flare-up - know when to call the doctor - dress right for the weather, hot or cold    Why is this important?   You will be able to handle your symptoms better if you keep track of them.  Making some simple changes to your lifestyle will help.  Eating healthy is one thing you can do to take good care of yourself.    Notes:       Heart failure - improved self care management , will benefit from new talking scales, continued support from  home health RN/PT visits. Patient has declined Paramedicine visit at this time due to having home health agreeable to revisit.  Diabetes- improvement in monitoring and keeping a log, Libre sensor in place, has all needed supplies.    Appointments [name], NP 04/24/20 Dr.Rankin 12/28 for retina  eye exam Dr.Branch 06/07/20 , Cardiology  Patient has called to reschedule missed nephrology appointment   Plan:  Care coordination call to [place] regarding  paramedicine program in [facility] , regarding steps to enroll patient when agreed upon.  Message to [place] regarding arranging delivery of talking scales.  Will plan return call to patient in the next week.  Will send PCP this telephone visit note.    Joylene Draft, RN, BSN  [place] Management Coordinator  [phone]- Mobile [phone]- Toll Free Main Office

## 2020-04-20 DIAGNOSIS — I110 Hypertensive heart disease with heart failure: Secondary | ICD-10-CM | POA: Diagnosis not present

## 2020-04-20 DIAGNOSIS — I5023 Acute on chronic systolic (congestive) heart failure: Secondary | ICD-10-CM | POA: Diagnosis not present

## 2020-04-20 DIAGNOSIS — I482 Chronic atrial fibrillation, unspecified: Secondary | ICD-10-CM | POA: Diagnosis not present

## 2020-04-20 DIAGNOSIS — D631 Anemia in chronic kidney disease: Secondary | ICD-10-CM | POA: Diagnosis not present

## 2020-04-20 DIAGNOSIS — N1832 Chronic kidney disease, stage 3b: Secondary | ICD-10-CM | POA: Diagnosis not present

## 2020-04-20 DIAGNOSIS — E1122 Type 2 diabetes mellitus with diabetic chronic kidney disease: Secondary | ICD-10-CM | POA: Diagnosis not present

## 2020-04-21 DIAGNOSIS — N1832 Chronic kidney disease, stage 3b: Secondary | ICD-10-CM | POA: Diagnosis not present

## 2020-04-21 DIAGNOSIS — E1122 Type 2 diabetes mellitus with diabetic chronic kidney disease: Secondary | ICD-10-CM | POA: Diagnosis not present

## 2020-04-21 DIAGNOSIS — I482 Chronic atrial fibrillation, unspecified: Secondary | ICD-10-CM | POA: Diagnosis not present

## 2020-04-21 DIAGNOSIS — I110 Hypertensive heart disease with heart failure: Secondary | ICD-10-CM | POA: Diagnosis not present

## 2020-04-21 DIAGNOSIS — I5023 Acute on chronic systolic (congestive) heart failure: Secondary | ICD-10-CM | POA: Diagnosis not present

## 2020-04-21 DIAGNOSIS — D631 Anemia in chronic kidney disease: Secondary | ICD-10-CM | POA: Diagnosis not present

## 2020-04-24 DIAGNOSIS — M1000 Idiopathic gout, unspecified site: Secondary | ICD-10-CM | POA: Diagnosis not present

## 2020-04-24 DIAGNOSIS — J9610 Chronic respiratory failure, unspecified whether with hypoxia or hypercapnia: Secondary | ICD-10-CM | POA: Diagnosis not present

## 2020-04-24 DIAGNOSIS — R251 Tremor, unspecified: Secondary | ICD-10-CM | POA: Diagnosis not present

## 2020-04-24 DIAGNOSIS — D631 Anemia in chronic kidney disease: Secondary | ICD-10-CM | POA: Diagnosis not present

## 2020-04-24 DIAGNOSIS — F319 Bipolar disorder, unspecified: Secondary | ICD-10-CM | POA: Diagnosis not present

## 2020-04-24 DIAGNOSIS — E782 Mixed hyperlipidemia: Secondary | ICD-10-CM | POA: Diagnosis not present

## 2020-04-24 DIAGNOSIS — E1165 Type 2 diabetes mellitus with hyperglycemia: Secondary | ICD-10-CM | POA: Diagnosis not present

## 2020-04-24 DIAGNOSIS — E559 Vitamin D deficiency, unspecified: Secondary | ICD-10-CM | POA: Diagnosis not present

## 2020-04-24 DIAGNOSIS — R590 Localized enlarged lymph nodes: Secondary | ICD-10-CM | POA: Diagnosis not present

## 2020-04-24 DIAGNOSIS — F411 Generalized anxiety disorder: Secondary | ICD-10-CM | POA: Diagnosis not present

## 2020-04-24 DIAGNOSIS — E1122 Type 2 diabetes mellitus with diabetic chronic kidney disease: Secondary | ICD-10-CM | POA: Diagnosis not present

## 2020-04-24 DIAGNOSIS — M797 Fibromyalgia: Secondary | ICD-10-CM | POA: Diagnosis not present

## 2020-04-24 DIAGNOSIS — R21 Rash and other nonspecific skin eruption: Secondary | ICD-10-CM | POA: Diagnosis not present

## 2020-04-24 DIAGNOSIS — K5900 Constipation, unspecified: Secondary | ICD-10-CM | POA: Diagnosis not present

## 2020-04-24 DIAGNOSIS — G894 Chronic pain syndrome: Secondary | ICD-10-CM | POA: Diagnosis not present

## 2020-04-24 DIAGNOSIS — Z6836 Body mass index (BMI) 36.0-36.9, adult: Secondary | ICD-10-CM | POA: Diagnosis not present

## 2020-04-24 DIAGNOSIS — M109 Gout, unspecified: Secondary | ICD-10-CM | POA: Diagnosis not present

## 2020-04-24 DIAGNOSIS — G250 Essential tremor: Secondary | ICD-10-CM | POA: Diagnosis not present

## 2020-04-24 DIAGNOSIS — J449 Chronic obstructive pulmonary disease, unspecified: Secondary | ICD-10-CM | POA: Diagnosis not present

## 2020-04-24 DIAGNOSIS — K219 Gastro-esophageal reflux disease without esophagitis: Secondary | ICD-10-CM | POA: Diagnosis not present

## 2020-04-24 DIAGNOSIS — Z79891 Long term (current) use of opiate analgesic: Secondary | ICD-10-CM | POA: Diagnosis not present

## 2020-04-24 DIAGNOSIS — G8929 Other chronic pain: Secondary | ICD-10-CM | POA: Diagnosis not present

## 2020-04-24 DIAGNOSIS — J302 Other seasonal allergic rhinitis: Secondary | ICD-10-CM | POA: Diagnosis not present

## 2020-04-24 LAB — POCT INR: INR: 1.7 — AB (ref 2.0–3.0)

## 2020-04-25 ENCOUNTER — Ambulatory Visit (INDEPENDENT_AMBULATORY_CARE_PROVIDER_SITE_OTHER): Payer: Medicare Other | Admitting: *Deleted

## 2020-04-25 ENCOUNTER — Other Ambulatory Visit: Payer: Self-pay | Admitting: *Deleted

## 2020-04-25 DIAGNOSIS — I5023 Acute on chronic systolic (congestive) heart failure: Secondary | ICD-10-CM | POA: Diagnosis not present

## 2020-04-25 DIAGNOSIS — D631 Anemia in chronic kidney disease: Secondary | ICD-10-CM | POA: Diagnosis not present

## 2020-04-25 DIAGNOSIS — I110 Hypertensive heart disease with heart failure: Secondary | ICD-10-CM | POA: Diagnosis not present

## 2020-04-25 DIAGNOSIS — I4891 Unspecified atrial fibrillation: Secondary | ICD-10-CM

## 2020-04-25 DIAGNOSIS — Z5181 Encounter for therapeutic drug level monitoring: Secondary | ICD-10-CM | POA: Diagnosis not present

## 2020-04-25 DIAGNOSIS — E1122 Type 2 diabetes mellitus with diabetic chronic kidney disease: Secondary | ICD-10-CM | POA: Diagnosis not present

## 2020-04-25 DIAGNOSIS — I482 Chronic atrial fibrillation, unspecified: Secondary | ICD-10-CM | POA: Diagnosis not present

## 2020-04-25 DIAGNOSIS — N1832 Chronic kidney disease, stage 3b: Secondary | ICD-10-CM | POA: Diagnosis not present

## 2020-04-25 NOTE — Patient Instructions (Signed)
Has home monitor now.  Self-tester INR 1.7 yesterday   Has been taking warfarin 4mg  daily except 2mg  on Thursdays. Told to take warfarin 6mg  tonight then increase dose to 4mg  daily. Pt will recheck on Monday 05/01/20

## 2020-04-25 NOTE — Patient Outreach (Signed)
[facility]) Care Management  [place]  04/25/2020   Denise Freeman Dec 19, 1950 [id number]   Telephone Assessment    Patient is a 69 year old female with PMHX: Combined Systolic and Diastolic Heart Failure, Chronic hypoxic respiratory failure, chronic oxygen therapyat 3 litersOSA with Bipap, Chronic pain syndrome, hypertension,Chronic Atrial Fib,Diabetes( A1c 13.9% on 6/2/21per [facility])Depression with anxiety, partially blind both eyes, hard of hearing .  Inpatient Admission at [facility] 10/18-10/20/21 ( left AMA) Dx: Hyperglycemia Hyperosmolar non-ketotic state. Inpatient admission [facility] 10/21-10/25/21 Acute Heart Failure.    Subjective:  Successful outreach call to patient she voiced doing okay, but concerned regarding blood sugar reading per [employer], she reports having reading of 365 this morning, she has taken her insulin and plans recheck soon.  Patient states home health RN [name] was visiting on this morning and was aware plans to notify MD. Patient reports readings over the last 2 days have been 125- 138, she does not understand that high reading on today, does not recall eating foods on last night that would cause higher reading. She reports not being able to locate her true metrix meter to check blood sugars she will continue to look for monitor in home.  Patient discussed using coag meter to check INR in home she states not having any difficulty with checking readings, but she is having some difficulty with getting return call for dose of coumadin home health RN she states home health RN has placed a call to follow up.  Patient expressed thanks for talking scales, she reports that makes it so much easier with being able to hear daily weights to record. She denies increase in shortness of breath or swelling, states swelling is going down, she states measurement by home health in her waist and legs have decreased. She reports  her weight today is 172 lbs. Discussed continuing to limit salt in diet with holiday approaching patient discussed not adding salt to foods she is using Mrs[employer]Dash in preparing meals.    Encounter Medications:  Outpatient Encounter Medications as of 04/25/2020  Medication Sig Note  . albuterol (PROVENTIL) (2.5 MG/3ML) 0.083% nebulizer solution Take 3 mLs (2.5 mg total) by nebulization every 6 (six) hours as needed for wheezing or shortness of breath.   . allopurinol (ZYLOPRIM) 100 MG tablet TAKE 1 TABLET BY MOUTH ONCE DAILY. (Patient taking differently: Take 100 mg by mouth daily. )   . budesonide (PULMICORT) 0.5 MG/2ML nebulizer solution Take 2 mLs (0.5 mg total) by nebulization 2 (two) times daily.   . cholecalciferol (VITAMIN D) 400 units TABS tablet Take 1 tablet (400 Units total) by mouth 2 (two) times daily.   . clobetasol cream (TEMOVATE) 8.29 % Apply 1 application topically daily.    . colchicine 0.6 MG tablet Take 1 tablet (0.6 mg total) by mouth as needed. (Patient taking differently: Take 0.6 mg by mouth as needed (gout). )   . escitalopram (LEXAPRO) 10 MG tablet Take 1 tablet (10 mg total) by mouth at bedtime.   . FEROSUL 325 (65 Fe) MG tablet Take 325 mg by mouth daily.    . fluticasone (FLONASE) 50 MCG/ACT nasal spray Place 1 spray into both nostrils 2 (two) times daily.   . furosemide (LASIX) 40 MG tablet Take 3 tablets (120 mg total) by mouth 2 (two) times daily.   . Insulin Glargine (BASAGLAR KWIKPEN) 100 UNIT/ML Inject 20 Units into the skin 2 (two) times daily.   . insulin  lispro (HUMALOG) 100 UNIT/ML KiwkPen Inject 0.02 mLs (2 Units total) into the skin 3 (three) times daily before meals.   Marland Kitchen ipratropium (ATROVENT) 0.02 % nebulizer solution Take 2.5 mLs (0.5 mg total) by nebulization every 6 (six) hours as needed for wheezing or shortness of breath.   . metolazone (ZAROXOLYN) 2.5 MG tablet Take 1 tablet (2.5 mg total) by mouth daily.   . metoprolol succinate (TOPROL-XL) 25 MG 24  hr tablet TAKE 1 TABLET BY MOUTH TWICE DAILY (Patient taking differently: Take 25 mg by mouth in the morning and at bedtime. ) 03/22/2020: LF: 03/08/2020 DS:28   . mirtazapine (REMERON) 15 MG tablet Take 1 tablet (15 mg total) by mouth at bedtime. 03/22/2020: LF: 03/08/2020 DS:28   . modafinil (PROVIGIL) 200 MG tablet Take 1 tablet (200 mg total) by mouth daily. 03/22/2020: LF: 03/08/2020 DS:28   . montelukast (SINGULAIR) 10 MG tablet Take 1 tablet (10 mg total) by mouth at bedtime. 03/22/2020: LF: 03/08/2020 DS:28   . nitroGLYCERIN (NITROSTAT) 0.4 MG SL tablet Place 0.4 mg under the tongue every 5 (five) minutes as needed for chest pain.  03/22/2020: Pt hasn't needed  . oxyCODONE-acetaminophen (PERCOCET/ROXICET) 5-325 MG tablet Take 1 tablet by mouth 2 (two) times daily as needed for severe pain. 03/22/2020: LF:03/01/2020 DS:30   . pantoprazole (PROTONIX) 20 MG tablet TAKE (1) TABLET BY MOUTH TWICE A DAY BEFORE MEALS. (BREAKFAST AND SUPPER) (Patient taking differently: Take 20 mg by mouth 2 (two) times daily before a meal. ) 03/22/2020: LF: 03/08/2020 DS:28   . primidone (MYSOLINE) 50 MG tablet TAKE ONE TABLET BY MOUTH AT BEDTIME. (Patient taking differently: Take 50 mg by mouth at bedtime. ) 03/22/2020: LF: 03/08/2020 DS:28   . Probiotic Product (PROBIOTIC PO) Take 1 tablet by mouth at bedtime.   . TRUE METRIX BLOOD GLUCOSE TEST test strip USE TO TEST BLOOD SUGAR AS DIRECTED.   . Vitamin A 2400 MCG (8000 UT) CAPS Take 8,000 Units by mouth every morning.  03/22/2020: LF: 03/08/2020 DS: 28  . Vitamin D, Ergocalciferol, (DRISDOL) 1.25 MG (50000 UNIT) CAPS capsule Take 1 capsule (50,000 Units total) by mouth every 7 (seven) days.   Marland Kitchen warfarin (COUMADIN) 4 MG tablet Take 2 tablets tonight, 1 1/2 tablets tomorrow night then increase dose to 1 tablet daily (Patient taking differently: Take 4 mg by mouth daily. ) 03/22/2020: Per Pharmacy records, warfarin was filled as 1t;po;d LF: 03/08/2020 DS:28  .  [DISCONTINUED] potassium chloride 20 MEQ TBCR Take 20 mEq by mouth 2 (two) times daily.    No facility-administered encounter medications on file as of 04/25/2020.    Functional Status:  In your present state of health, do you have any difficulty performing the following activities: 03/23/2020 03/20/2020  Hearing? [place]? Y -  Comment - -  Difficulty concentrating or making decisions? N -  Walking or climbing stairs? Y -  Comment - -  Dressing or bathing? Y -  Comment - -  Doing errands, shopping? [place] and eating ? - -  Comment - -  Using the Toilet? - -  In the past six months, have you accidently leaked urine? - -  Do you have problems with loss of bowel control? - -  Managing your Medications? - -  Managing your Finances? - -  Comment - -  Housekeeping or managing your Housekeeping? - -  Comment - -  Some recent data  might be hidden    Fall/Depression Screening: Fall Risk  02/01/2020 06/11/2019 05/17/2019  Falls in the past year? 1 1 1   Number falls in past yr: 0 1 1  Injury with Fall? 0 1 1  Comment - - -  Risk Factor Category  - - -  Comment - - -  Risk for fall due to : History of fall(s);Impaired balance/gait;Impaired vision History of fall(s) History of fall(s);Impaired vision;Impaired mobility  Risk for fall due to: Comment - - -  Follow up Falls prevention discussed Falls evaluation completed;Education provided Education provided;Falls prevention discussed   PHQ 2/9 Scores 05/17/2019 11/28/2017 11/27/2017 10/03/2017 09/15/2017 09/05/2017 08/07/2017  PHQ - 2 Score 0 0 5 6 0 0 0  PHQ- 9 Score - - - 23 - - -    Assessment:  Goals Addressed            This Visit's Progress   . Make and Keep All Appointments   On track    Follow Up Date 04/12/20   - call to cancel if needed - keep a calendar with appointment dates Reschedule missed appointments     Why is this important?   Part of staying healthy is seeing the  doctor for follow-up care.  If you forget your appointments, there are some things you can do to stay on track.    Notes:     Marland Kitchen Manage My Medicine   On track    Follow Up Date 04/06/20   - call for medicine refill 2 or 3 days before it runs out  - take medications as prescribed per most recent after visit summary   Why is this important?   These steps will help you keep on track with your medicines.    Notes:  Pharmacy referral due to cost of insulins    . Monitor and Manage My Blood Sugar   On track    Follow Up Date 04/04/20   - check blood sugar at prescribed times - check blood sugar if I feel it is too high or too low - take the blood sugar meter to all doctor visits  Replace libre sensor to monitor blood sugars - with spouse assistance    Why is this important?   Checking your blood sugar at home helps to keep it from getting very high or very low.  Writing the results in a diary or log helps the doctor know how to care for you.  Your blood sugar log should have the time, date and the results.  Also, write down the amount of insulin or other medicine that you take.  Other information, like what you ate, exercise done and how you were feeling, will also be helpful.     Notes:     . Obtain Eye Exam   On track    Follow Up Date11/30/21   - keep appointment with eye doctor  Rescheduled missed appointments    Why is this important?   Eye check-ups are important when you have diabetes.  Vision loss can be prevented.    Notes:     . Perform Foot Care   On track    Follow Up Date 05/02/20   - check feet daily for cuts, sores or redness - wash and dry feet carefully every day - wear comfortable, cotton socks - wear comfortable, well-fitting shoes    Why is this important?   Good foot care is very important when you have diabetes.  There  are many things you can do to keep your feet healthy and catch a problem early.    Notes:  Encourage spouse to assist with  observance of feet and with care     . Set My Target A1C   Not on track    Follow Up Date 04/04/20   - set target A1C    Why is this important?   Your target A1C is decided together by you and your doctor.  It is based on several things like your age and other health issues.    Notes:  Call to PCP office regarding Ac1    . Track and Manage Activity and Exertion   On track    Follow Up Date 04/18/20   - pace activity allowing for rest  -participate with home health therapy    Why is this important?   Exercising is very important when managing your heart failure.  It will help your heart get stronger.    Notes:  Fall safety, use of walker, bedside commode     . Track and Manage Fluids and Swelling   On track    Follow Up Date 05/02/20   - call office if I gain more than 2 pounds in one day or 5 pounds in one week - do ankle pumps when sitting - keep legs up while sitting - track weight in diary - watch for swelling in feet, ankles and legs every day - weigh myself daily    Why is this important?   It is important to check your weight daily and watch how much salt and liquids you have.  It will help you to manage your heart failure.    Notes:     . Track and Manage Symptoms   On track    Follow Up Date 05/02/20   - bring diary to all appointments - follow rescue plan if symptoms flare-up - know when to call the doctor - dress right for the weather, hot or cold    Why is this important?   You will be able to handle your symptoms better if you keep track of them.  Making some simple changes to your lifestyle will help.  Eating healthy is one thing you can do to take good care of yourself.    Notes:         Plan:   Patient gave verbal consent and in agreement with RN CM follow up  timeframe in the next week. Patient aware that they may contact RN CM sooner for any issues or concerns. Care coordination call to [facility] RN , regarding patient above  concerns related to elevated blood sugar and delay in getting follow up on INR results called in.  Spoke with [name] [facility] that visited patient on today she discussed making contact with Dr. Nevada Crane today regarding blood sugar elevation on this am as well as  follow up on INR readings for new dose she has placed call to company that patient  calls INR reading into for follow up as she explains Dr. Nevada Crane office will get result from that company to manage coumadin dosing.  Also discussed with [name] possible plans for Paramedicine program for patient prior to home health completion.    Joylene Draft, RN, BSN  [place] Management Coordinator  [phone]- Mobile [phone]- Toll Free Main Office

## 2020-04-26 DIAGNOSIS — I482 Chronic atrial fibrillation, unspecified: Secondary | ICD-10-CM | POA: Diagnosis not present

## 2020-04-26 DIAGNOSIS — N1832 Chronic kidney disease, stage 3b: Secondary | ICD-10-CM | POA: Diagnosis not present

## 2020-04-26 DIAGNOSIS — D631 Anemia in chronic kidney disease: Secondary | ICD-10-CM | POA: Diagnosis not present

## 2020-04-26 DIAGNOSIS — I5023 Acute on chronic systolic (congestive) heart failure: Secondary | ICD-10-CM | POA: Diagnosis not present

## 2020-04-26 DIAGNOSIS — I110 Hypertensive heart disease with heart failure: Secondary | ICD-10-CM | POA: Diagnosis not present

## 2020-04-26 DIAGNOSIS — E1122 Type 2 diabetes mellitus with diabetic chronic kidney disease: Secondary | ICD-10-CM | POA: Diagnosis not present

## 2020-04-28 DIAGNOSIS — E1122 Type 2 diabetes mellitus with diabetic chronic kidney disease: Secondary | ICD-10-CM | POA: Diagnosis not present

## 2020-04-28 DIAGNOSIS — I5023 Acute on chronic systolic (congestive) heart failure: Secondary | ICD-10-CM | POA: Diagnosis not present

## 2020-04-28 DIAGNOSIS — D631 Anemia in chronic kidney disease: Secondary | ICD-10-CM | POA: Diagnosis not present

## 2020-04-28 DIAGNOSIS — I110 Hypertensive heart disease with heart failure: Secondary | ICD-10-CM | POA: Diagnosis not present

## 2020-04-28 DIAGNOSIS — N1832 Chronic kidney disease, stage 3b: Secondary | ICD-10-CM | POA: Diagnosis not present

## 2020-04-28 DIAGNOSIS — I482 Chronic atrial fibrillation, unspecified: Secondary | ICD-10-CM | POA: Diagnosis not present

## 2020-05-01 ENCOUNTER — Ambulatory Visit (INDEPENDENT_AMBULATORY_CARE_PROVIDER_SITE_OTHER): Payer: Medicare Other | Admitting: *Deleted

## 2020-05-01 ENCOUNTER — Ambulatory Visit: Payer: Medicare Other | Admitting: Cardiology

## 2020-05-01 DIAGNOSIS — I5021 Acute systolic (congestive) heart failure: Secondary | ICD-10-CM

## 2020-05-01 DIAGNOSIS — Z7901 Long term (current) use of anticoagulants: Secondary | ICD-10-CM | POA: Diagnosis not present

## 2020-05-01 DIAGNOSIS — Z5181 Encounter for therapeutic drug level monitoring: Secondary | ICD-10-CM | POA: Diagnosis not present

## 2020-05-01 DIAGNOSIS — I4821 Permanent atrial fibrillation: Secondary | ICD-10-CM | POA: Diagnosis not present

## 2020-05-01 LAB — POCT INR: INR: 2 (ref 2.0–3.0)

## 2020-05-01 NOTE — Patient Instructions (Signed)
Has home monitor now.  Self-tester Continue warfarin 4mg  daily. Pt will recheck in 1 week

## 2020-05-02 DIAGNOSIS — D631 Anemia in chronic kidney disease: Secondary | ICD-10-CM | POA: Diagnosis not present

## 2020-05-02 DIAGNOSIS — I482 Chronic atrial fibrillation, unspecified: Secondary | ICD-10-CM | POA: Diagnosis not present

## 2020-05-02 DIAGNOSIS — E1122 Type 2 diabetes mellitus with diabetic chronic kidney disease: Secondary | ICD-10-CM | POA: Diagnosis not present

## 2020-05-02 DIAGNOSIS — N1832 Chronic kidney disease, stage 3b: Secondary | ICD-10-CM | POA: Diagnosis not present

## 2020-05-02 DIAGNOSIS — I110 Hypertensive heart disease with heart failure: Secondary | ICD-10-CM | POA: Diagnosis not present

## 2020-05-02 DIAGNOSIS — I5023 Acute on chronic systolic (congestive) heart failure: Secondary | ICD-10-CM | POA: Diagnosis not present

## 2020-05-04 ENCOUNTER — Other Ambulatory Visit: Payer: Self-pay | Admitting: *Deleted

## 2020-05-04 DIAGNOSIS — N1832 Chronic kidney disease, stage 3b: Secondary | ICD-10-CM | POA: Diagnosis not present

## 2020-05-04 DIAGNOSIS — I110 Hypertensive heart disease with heart failure: Secondary | ICD-10-CM | POA: Diagnosis not present

## 2020-05-04 DIAGNOSIS — I5023 Acute on chronic systolic (congestive) heart failure: Secondary | ICD-10-CM | POA: Diagnosis not present

## 2020-05-04 DIAGNOSIS — E1122 Type 2 diabetes mellitus with diabetic chronic kidney disease: Secondary | ICD-10-CM | POA: Diagnosis not present

## 2020-05-04 DIAGNOSIS — D631 Anemia in chronic kidney disease: Secondary | ICD-10-CM | POA: Diagnosis not present

## 2020-05-04 DIAGNOSIS — I482 Chronic atrial fibrillation, unspecified: Secondary | ICD-10-CM | POA: Diagnosis not present

## 2020-05-04 NOTE — Patient Outreach (Signed)
[facility]) Care Management  Denise Freeman  05/04/2020   Denise Freeman June 04, 1950 [id number]   Telephone Assessment  Chronic Care management to complex care management   Telephone Assessment  Patient is a 69 year old female with PMHX: Combined Systolic and Diastolic Heart Failure, Chronic hypoxic respiratory failure, chronic oxygen therapyat 3 litersOSA with Bipap, Chronic pain syndrome, hypertension,Chronic Atrial Fib,Diabetes( A1c 13.9% on 6/2/21per [facility])Depression with anxiety, partially blind both eyes, hard of hearing .  Inpatient Admission at [facility] 10/18-10/20/21 ( left AMA) Dx: Hyperglycemia Hyperosmolar non-ketotic state. Inpatient admission [facility] 10/21-10/25/21 Acute Heart Failure   Subjective:  Successful outreach call to patient , states she is doing fair, recently getting in from grocery shopping.  She discussed that her weight has been staying in good, she reports weight today is 171 and 172 on yesterday, she denies increase in weight gain. She denies increase in shortness of breath or swelling.  She discussed that her blood sugar this morning was 122,and ranges 122-200,  highest reading 379, over the last 2 weeks not on  Consecutive days ,  states overall it is doing better.  She discussed having home health RN visit on today,she states nurse reports that she is doing better. Patient again mentioned palliative care consult as prior mentioned by [facility] after services completed. Discussed paramedicine program as previously discussed as a resource for patient she again states she wants the Palliative care program, discussed possibly being able to having both programs when explaining paramedicine program.    Encounter Medications:  Outpatient Encounter Medications as of 05/04/2020  Medication Sig Note  . albuterol (PROVENTIL) (2.5 MG/3ML) 0.083% nebulizer solution Take 3 mLs (2.5 mg total) by nebulization every 6 (six) hours  as needed for wheezing or shortness of breath.   . allopurinol (ZYLOPRIM) 100 MG tablet TAKE 1 TABLET BY MOUTH ONCE DAILY. (Patient taking differently: Take 100 mg by mouth daily. )   . budesonide (PULMICORT) 0.5 MG/2ML nebulizer solution Take 2 mLs (0.5 mg total) by nebulization 2 (two) times daily.   . cholecalciferol (VITAMIN D) 400 units TABS tablet Take 1 tablet (400 Units total) by mouth 2 (two) times daily.   . clobetasol cream (TEMOVATE) 8.29 % Apply 1 application topically daily.    . colchicine 0.6 MG tablet Take 1 tablet (0.6 mg total) by mouth as needed. (Patient taking differently: Take 0.6 mg by mouth as needed (gout). )   . escitalopram (LEXAPRO) 10 MG tablet Take 1 tablet (10 mg total) by mouth at bedtime.   . FEROSUL 325 (65 Fe) MG tablet Take 325 mg by mouth daily.    . fluticasone (FLONASE) 50 MCG/ACT nasal spray Place 1 spray into both nostrils 2 (two) times daily.   . furosemide (LASIX) 40 MG tablet Take 3 tablets (120 mg total) by mouth 2 (two) times daily.   . Insulin Glargine (BASAGLAR KWIKPEN) 100 UNIT/ML Inject 20 Units into the skin 2 (two) times daily.   . insulin lispro (HUMALOG) 100 UNIT/ML KiwkPen Inject 0.02 mLs (2 Units total) into the skin 3 (three) times daily before meals.   Marland Kitchen ipratropium (ATROVENT) 0.02 % nebulizer solution Take 2.5 mLs (0.5 mg total) by nebulization every 6 (six) hours as needed for wheezing or shortness of breath.   . metolazone (ZAROXOLYN) 2.5 MG tablet Take 1 tablet (2.5 mg total) by mouth daily.   . metoprolol succinate (TOPROL-XL) 25 MG 24 hr tablet TAKE 1 TABLET BY MOUTH TWICE DAILY (  Patient taking differently: Take 25 mg by mouth in the morning and at bedtime. ) 03/22/2020: LF: 03/08/2020 DS:28   . mirtazapine (REMERON) 15 MG tablet Take 1 tablet (15 mg total) by mouth at bedtime. 03/22/2020: LF: 03/08/2020 DS:28   . modafinil (PROVIGIL) 200 MG tablet Take 1 tablet (200 mg total) by mouth daily. 03/22/2020: LF: 03/08/2020 DS:28   .  montelukast (SINGULAIR) 10 MG tablet Take 1 tablet (10 mg total) by mouth at bedtime. 03/22/2020: LF: 03/08/2020 DS:28   . nitroGLYCERIN (NITROSTAT) 0.4 MG SL tablet Place 0.4 mg under the tongue every 5 (five) minutes as needed for chest pain.  03/22/2020: Pt hasn't needed  . oxyCODONE-acetaminophen (PERCOCET/ROXICET) 5-325 MG tablet Take 1 tablet by mouth 2 (two) times daily as needed for severe pain. 03/22/2020: LF:03/01/2020 DS:30   . pantoprazole (PROTONIX) 20 MG tablet TAKE (1) TABLET BY MOUTH TWICE A DAY BEFORE MEALS. (BREAKFAST AND SUPPER) (Patient taking differently: Take 20 mg by mouth 2 (two) times daily before a meal. ) 03/22/2020: LF: 03/08/2020 DS:28   . primidone (MYSOLINE) 50 MG tablet TAKE ONE TABLET BY MOUTH AT BEDTIME. (Patient taking differently: Take 50 mg by mouth at bedtime. ) 03/22/2020: LF: 03/08/2020 DS:28   . Probiotic Product (PROBIOTIC PO) Take 1 tablet by mouth at bedtime.   . TRUE METRIX BLOOD GLUCOSE TEST test strip USE TO TEST BLOOD SUGAR AS DIRECTED.   . Vitamin A 2400 MCG (8000 UT) CAPS Take 8,000 Units by mouth every morning.  03/22/2020: LF: 03/08/2020 DS: 28  . Vitamin D, Ergocalciferol, (DRISDOL) 1.25 MG (50000 UNIT) CAPS capsule Take 1 capsule (50,000 Units total) by mouth every 7 (seven) days.   Marland Kitchen warfarin (COUMADIN) 4 MG tablet Take 2 tablets tonight, 1 1/2 tablets tomorrow night then increase dose to 1 tablet daily (Patient taking differently: Take 4 mg by mouth daily. ) 03/22/2020: Per Pharmacy records, warfarin was filled as 1t;po;d LF: 03/08/2020 DS:28  . [DISCONTINUED] potassium chloride 20 MEQ TBCR Take 20 mEq by mouth 2 (two) times daily.    No facility-administered encounter medications on file as of 05/04/2020.    Functional Status:  In your present state of health, do you have any difficulty performing the following activities: 03/23/2020 03/20/2020  Hearing? [place]? Y -  Comment - -  Difficulty concentrating or making  decisions? N -  Walking or climbing stairs? Y -  Comment - -  Dressing or bathing? Y -  Comment - -  Doing errands, shopping? [place] and eating ? - -  Comment - -  Using the Toilet? - -  In the past six months, have you accidently leaked urine? - -  Do you have problems with loss of bowel control? - -  Managing your Medications? - -  Managing your Finances? - -  Comment - -  Housekeeping or managing your Housekeeping? - -  Comment - -  Some recent data might be hidden    Fall/Depression Screening: Fall Risk  02/01/2020 06/11/2019 05/17/2019  Falls in the past year? 1 1 1   Number falls in past yr: 0 1 1  Injury with Fall? 0 1 1  Comment - - -  Risk Factor Category  - - -  Comment - - -  Risk for fall due to : History of fall(s);Impaired balance/gait;Impaired vision History of fall(s) History of fall(s);Impaired vision;Impaired mobility  Risk for fall due to: Comment - - -  Follow up Falls prevention discussed Falls evaluation completed;Education provided Education provided;Falls prevention discussed   PHQ 2/9 Scores 05/17/2019 11/28/2017 11/27/2017 10/03/2017 09/15/2017 09/05/2017 08/07/2017  PHQ - 2 Score 0 0 5 6 0 0 0  PHQ- 9 Score - - - 23 - - -    Assessment:  Goals Addressed            This Visit's Progress   . Make and Keep All Appointments       Follow Up Date 06/01/20   - call to cancel if needed - keep a calendar with appointment dates Reschedule missed appointments     Why is this important?   Part of staying healthy is seeing the doctor for follow-up care.  If you forget your appointments, there are some things you can do to stay on track.    Notes:     Marland Kitchen Manage My Medicine   On track    Follow Up Date 06/01/20   - call for medicine refill 2 or 3 days before it runs out  - take medications as prescribed per most recent after visit summary   Why is this important?   These steps will help you keep on track with your medicines.     Notes:  Pharmacy referral due to cost of insulins    . Monitor and Manage My Blood Sugar   On track    Follow Up Date 06/01/20   - check blood sugar at prescribed times - check blood sugar if I feel it is too high or too low - take the blood sugar meter to all doctor visits  Replace libre sensor to monitor blood sugars - with spouse assistance    Why is this important?   Checking your blood sugar at home helps to keep it from getting very high or very low.  Writing the results in a diary or log helps the doctor know how to care for you.  Your blood sugar log should have the time, date and the results.  Also, write down the amount of insulin or other medicine that you take.  Other information, like what you ate, exercise done and how you were feeling, will also be helpful.     Notes:     . Obtain Eye Exam   On track    Follow Up Date 06/01/20   - keep appointment with eye doctor  Rescheduled missed appointments    Why is this important?   Eye check-ups are important when you have diabetes.  Vision loss can be prevented.    Notes:     . Perform Foot Care   On track    Follow Up Date 05/02/20   - check feet daily for cuts, sores or redness - wash and dry feet carefully every day - wear comfortable, cotton socks - wear comfortable, well-fitting shoes    Why is this important?   Good foot care is very important when you have diabetes.  There are many things you can do to keep your feet healthy and catch a problem early.    Notes:  Encourage spouse to assist with observance of feet and with care     . Set My Target A1C   Not on track    Follow Up Date 06/01/20   - set target A1C    Why is this important?   Your target A1C is decided together by you and your doctor.  It is based on several things like your  age and other health issues.    Notes:  Call to PCP office regarding Ac1    . Track and Manage Activity and Exertion   On track    Follow Up Date 06/01/20    - pace activity allowing for rest  -participate with home health therapy    Why is this important?   Exercising is very important when managing your heart failure.  It will help your heart get stronger.    Notes:  Fall safety, use of walker, bedside commode     . Track and Manage Fluids and Swelling       Follow Up Date 06/01/20   - call office if I gain more than 2 pounds in one day or 5 pounds in one week - do ankle pumps when sitting - keep legs up while sitting - track weight in diary - watch for swelling in feet, ankles and legs every day - weigh myself daily    Why is this important?   It is important to check your weight daily and watch how much salt and liquids you have.  It will help you to manage your heart failure.    Notes:     . Track and Manage Symptoms       Follow Up Date 06/01/20   - bring diary to all appointments - follow rescue plan if symptoms flare-up - know when to call the doctor - dress right for the weather, hot or cold    Why is this important?   You will be able to handle your symptoms better if you keep track of them.  Making some simple changes to your lifestyle will help.  Eating healthy is one thing you can do to take good care of yourself.    Notes:         Plan:  Patient agreeable to follow up call in the next 2 weeks.  Will follow up with PCP office regarding patient request for palliative care referral prior to next visit. Will send patient palliative care emmi handout , and EMMI on Hemoglobin A1c  Joylene Draft, RN, BSN  [place] Management Coordinator  [phone]- Mobile [phone]- [place]

## 2020-05-05 ENCOUNTER — Other Ambulatory Visit: Payer: Self-pay | Admitting: Internal Medicine

## 2020-05-05 DIAGNOSIS — I5023 Acute on chronic systolic (congestive) heart failure: Secondary | ICD-10-CM

## 2020-05-08 ENCOUNTER — Ambulatory Visit (INDEPENDENT_AMBULATORY_CARE_PROVIDER_SITE_OTHER): Payer: Medicare Other | Admitting: *Deleted

## 2020-05-08 ENCOUNTER — Other Ambulatory Visit: Payer: Self-pay | Admitting: Internal Medicine

## 2020-05-08 ENCOUNTER — Telehealth: Payer: Self-pay | Admitting: Cardiology

## 2020-05-08 DIAGNOSIS — I4891 Unspecified atrial fibrillation: Secondary | ICD-10-CM | POA: Diagnosis not present

## 2020-05-08 DIAGNOSIS — I110 Hypertensive heart disease with heart failure: Secondary | ICD-10-CM | POA: Diagnosis not present

## 2020-05-08 DIAGNOSIS — Z5181 Encounter for therapeutic drug level monitoring: Secondary | ICD-10-CM | POA: Diagnosis not present

## 2020-05-08 DIAGNOSIS — E1122 Type 2 diabetes mellitus with diabetic chronic kidney disease: Secondary | ICD-10-CM | POA: Diagnosis not present

## 2020-05-08 DIAGNOSIS — I5023 Acute on chronic systolic (congestive) heart failure: Secondary | ICD-10-CM

## 2020-05-08 DIAGNOSIS — D631 Anemia in chronic kidney disease: Secondary | ICD-10-CM | POA: Diagnosis not present

## 2020-05-08 DIAGNOSIS — I482 Chronic atrial fibrillation, unspecified: Secondary | ICD-10-CM | POA: Diagnosis not present

## 2020-05-08 DIAGNOSIS — N1832 Chronic kidney disease, stage 3b: Secondary | ICD-10-CM | POA: Diagnosis not present

## 2020-05-08 LAB — POCT INR: INR: 2 (ref 2.0–3.0)

## 2020-05-08 NOTE — Patient Instructions (Signed)
Has home monitor now.  Self-tester Continue warfarin 4mg  daily. Pt will recheck in 1 week

## 2020-05-08 NOTE — Telephone Encounter (Signed)
Gave patient warfarin instructions and she verbalized understanding.

## 2020-05-09 DIAGNOSIS — M1000 Idiopathic gout, unspecified site: Secondary | ICD-10-CM | POA: Diagnosis not present

## 2020-05-09 DIAGNOSIS — M545 Low back pain, unspecified: Secondary | ICD-10-CM | POA: Diagnosis not present

## 2020-05-09 DIAGNOSIS — G4733 Obstructive sleep apnea (adult) (pediatric): Secondary | ICD-10-CM | POA: Diagnosis not present

## 2020-05-09 DIAGNOSIS — K219 Gastro-esophageal reflux disease without esophagitis: Secondary | ICD-10-CM | POA: Diagnosis not present

## 2020-05-09 DIAGNOSIS — F411 Generalized anxiety disorder: Secondary | ICD-10-CM | POA: Diagnosis not present

## 2020-05-09 DIAGNOSIS — F319 Bipolar disorder, unspecified: Secondary | ICD-10-CM | POA: Diagnosis not present

## 2020-05-09 DIAGNOSIS — N1832 Chronic kidney disease, stage 3b: Secondary | ICD-10-CM | POA: Diagnosis not present

## 2020-05-09 DIAGNOSIS — D631 Anemia in chronic kidney disease: Secondary | ICD-10-CM | POA: Diagnosis not present

## 2020-05-09 DIAGNOSIS — E782 Mixed hyperlipidemia: Secondary | ICD-10-CM | POA: Diagnosis not present

## 2020-05-09 DIAGNOSIS — G250 Essential tremor: Secondary | ICD-10-CM | POA: Diagnosis not present

## 2020-05-09 DIAGNOSIS — Z6835 Body mass index (BMI) 35.0-35.9, adult: Secondary | ICD-10-CM | POA: Diagnosis not present

## 2020-05-09 DIAGNOSIS — J449 Chronic obstructive pulmonary disease, unspecified: Secondary | ICD-10-CM | POA: Diagnosis not present

## 2020-05-09 DIAGNOSIS — G894 Chronic pain syndrome: Secondary | ICD-10-CM | POA: Diagnosis not present

## 2020-05-09 DIAGNOSIS — I110 Hypertensive heart disease with heart failure: Secondary | ICD-10-CM | POA: Diagnosis not present

## 2020-05-09 DIAGNOSIS — M797 Fibromyalgia: Secondary | ICD-10-CM | POA: Diagnosis not present

## 2020-05-09 DIAGNOSIS — E6609 Other obesity due to excess calories: Secondary | ICD-10-CM | POA: Diagnosis not present

## 2020-05-09 DIAGNOSIS — J9611 Chronic respiratory failure with hypoxia: Secondary | ICD-10-CM | POA: Diagnosis not present

## 2020-05-09 DIAGNOSIS — E1122 Type 2 diabetes mellitus with diabetic chronic kidney disease: Secondary | ICD-10-CM | POA: Diagnosis not present

## 2020-05-09 DIAGNOSIS — I5023 Acute on chronic systolic (congestive) heart failure: Secondary | ICD-10-CM | POA: Diagnosis not present

## 2020-05-09 DIAGNOSIS — J302 Other seasonal allergic rhinitis: Secondary | ICD-10-CM | POA: Diagnosis not present

## 2020-05-09 DIAGNOSIS — I482 Chronic atrial fibrillation, unspecified: Secondary | ICD-10-CM | POA: Diagnosis not present

## 2020-05-09 DIAGNOSIS — E559 Vitamin D deficiency, unspecified: Secondary | ICD-10-CM | POA: Diagnosis not present

## 2020-05-09 DIAGNOSIS — E876 Hypokalemia: Secondary | ICD-10-CM | POA: Diagnosis not present

## 2020-05-09 DIAGNOSIS — E1165 Type 2 diabetes mellitus with hyperglycemia: Secondary | ICD-10-CM | POA: Diagnosis not present

## 2020-05-11 ENCOUNTER — Other Ambulatory Visit: Payer: Self-pay | Admitting: Internal Medicine

## 2020-05-11 DIAGNOSIS — I5023 Acute on chronic systolic (congestive) heart failure: Secondary | ICD-10-CM | POA: Diagnosis not present

## 2020-05-11 DIAGNOSIS — D631 Anemia in chronic kidney disease: Secondary | ICD-10-CM | POA: Diagnosis not present

## 2020-05-11 DIAGNOSIS — N1832 Chronic kidney disease, stage 3b: Secondary | ICD-10-CM | POA: Diagnosis not present

## 2020-05-11 DIAGNOSIS — I482 Chronic atrial fibrillation, unspecified: Secondary | ICD-10-CM | POA: Diagnosis not present

## 2020-05-11 DIAGNOSIS — I4891 Unspecified atrial fibrillation: Secondary | ICD-10-CM

## 2020-05-11 DIAGNOSIS — E1122 Type 2 diabetes mellitus with diabetic chronic kidney disease: Secondary | ICD-10-CM | POA: Diagnosis not present

## 2020-05-11 DIAGNOSIS — I110 Hypertensive heart disease with heart failure: Secondary | ICD-10-CM | POA: Diagnosis not present

## 2020-05-12 DIAGNOSIS — J0100 Acute maxillary sinusitis, unspecified: Secondary | ICD-10-CM | POA: Diagnosis not present

## 2020-05-12 DIAGNOSIS — J449 Chronic obstructive pulmonary disease, unspecified: Secondary | ICD-10-CM | POA: Diagnosis not present

## 2020-05-12 DIAGNOSIS — J209 Acute bronchitis, unspecified: Secondary | ICD-10-CM | POA: Diagnosis not present

## 2020-05-12 DIAGNOSIS — R051 Acute cough: Secondary | ICD-10-CM | POA: Diagnosis not present

## 2020-05-18 DIAGNOSIS — R5381 Other malaise: Secondary | ICD-10-CM | POA: Diagnosis not present

## 2020-05-18 DIAGNOSIS — D631 Anemia in chronic kidney disease: Secondary | ICD-10-CM | POA: Diagnosis not present

## 2020-05-18 DIAGNOSIS — I5023 Acute on chronic systolic (congestive) heart failure: Secondary | ICD-10-CM | POA: Diagnosis not present

## 2020-05-18 DIAGNOSIS — N1832 Chronic kidney disease, stage 3b: Secondary | ICD-10-CM | POA: Diagnosis not present

## 2020-05-18 DIAGNOSIS — I482 Chronic atrial fibrillation, unspecified: Secondary | ICD-10-CM | POA: Diagnosis not present

## 2020-05-18 DIAGNOSIS — E1122 Type 2 diabetes mellitus with diabetic chronic kidney disease: Secondary | ICD-10-CM | POA: Diagnosis not present

## 2020-05-18 DIAGNOSIS — I959 Hypotension, unspecified: Secondary | ICD-10-CM | POA: Diagnosis not present

## 2020-05-18 DIAGNOSIS — I110 Hypertensive heart disease with heart failure: Secondary | ICD-10-CM | POA: Diagnosis not present

## 2020-05-19 DIAGNOSIS — R9431 Abnormal electrocardiogram [ECG] [EKG]: Secondary | ICD-10-CM | POA: Diagnosis not present

## 2020-05-19 DIAGNOSIS — N3001 Acute cystitis with hematuria: Secondary | ICD-10-CM | POA: Diagnosis not present

## 2020-05-19 DIAGNOSIS — J90 Pleural effusion, not elsewhere classified: Secondary | ICD-10-CM | POA: Diagnosis not present

## 2020-05-19 DIAGNOSIS — J189 Pneumonia, unspecified organism: Secondary | ICD-10-CM | POA: Diagnosis not present

## 2020-05-19 DIAGNOSIS — M797 Fibromyalgia: Secondary | ICD-10-CM | POA: Diagnosis not present

## 2020-05-19 DIAGNOSIS — I4891 Unspecified atrial fibrillation: Secondary | ICD-10-CM | POA: Diagnosis not present

## 2020-05-19 DIAGNOSIS — R0902 Hypoxemia: Secondary | ICD-10-CM | POA: Diagnosis not present

## 2020-05-19 DIAGNOSIS — I959 Hypotension, unspecified: Secondary | ICD-10-CM | POA: Diagnosis not present

## 2020-05-19 DIAGNOSIS — I444 Left anterior fascicular block: Secondary | ICD-10-CM | POA: Diagnosis not present

## 2020-05-19 DIAGNOSIS — Z87891 Personal history of nicotine dependence: Secondary | ICD-10-CM | POA: Diagnosis not present

## 2020-05-19 DIAGNOSIS — R0602 Shortness of breath: Secondary | ICD-10-CM | POA: Diagnosis not present

## 2020-05-19 DIAGNOSIS — I10 Essential (primary) hypertension: Secondary | ICD-10-CM | POA: Diagnosis not present

## 2020-05-19 DIAGNOSIS — J449 Chronic obstructive pulmonary disease, unspecified: Secondary | ICD-10-CM | POA: Diagnosis not present

## 2020-05-19 DIAGNOSIS — E1140 Type 2 diabetes mellitus with diabetic neuropathy, unspecified: Secondary | ICD-10-CM | POA: Diagnosis not present

## 2020-05-19 DIAGNOSIS — R42 Dizziness and giddiness: Secondary | ICD-10-CM | POA: Diagnosis not present

## 2020-05-19 DIAGNOSIS — R059 Cough, unspecified: Secondary | ICD-10-CM | POA: Diagnosis not present

## 2020-05-22 DIAGNOSIS — E1122 Type 2 diabetes mellitus with diabetic chronic kidney disease: Secondary | ICD-10-CM | POA: Diagnosis not present

## 2020-05-22 DIAGNOSIS — I5023 Acute on chronic systolic (congestive) heart failure: Secondary | ICD-10-CM | POA: Diagnosis not present

## 2020-05-22 DIAGNOSIS — N1832 Chronic kidney disease, stage 3b: Secondary | ICD-10-CM | POA: Diagnosis not present

## 2020-05-22 DIAGNOSIS — D631 Anemia in chronic kidney disease: Secondary | ICD-10-CM | POA: Diagnosis not present

## 2020-05-22 DIAGNOSIS — I110 Hypertensive heart disease with heart failure: Secondary | ICD-10-CM | POA: Diagnosis not present

## 2020-05-22 DIAGNOSIS — I482 Chronic atrial fibrillation, unspecified: Secondary | ICD-10-CM | POA: Diagnosis not present

## 2020-05-23 ENCOUNTER — Other Ambulatory Visit: Payer: Self-pay | Admitting: *Deleted

## 2020-05-23 NOTE — Patient Outreach (Signed)
[facility]) Care Management  [place]  05/23/2020   MYSTIC LABO 1950/11/21 [id number]   Telephone Assessment  Chronic Care management to complex care management   Patient is a 69 year old female with PMHX: Combined Systolic and Diastolic Heart Failure, Chronic hypoxic respiratory failure, chronic oxygen therapyat 3 litersOSA with Bipap, Chronic pain syndrome, hypertension,Chronic Atrial Fib,Diabetes( A1c 13.9% on 6/2/21per KPN)Depression with anxiety, partially blind both eyes, hard of hearing .  Inpatient Admission at [facility] 10/18-10/20/21 ( left AMA) Dx: Hyperglycemia Hyperosmolar non-ketotic state. Inpatient admission [facility] 10/21-10/25/21 Acute Heart Failure ED visit at [facility] 05/19/20  Subjective: Unsuccessful outreach call to patient no answer able to leave a HIPAA compliant voice mail message for return call.   Care Coordination  Placed call to [place] spoke with [place] [facility] RN that visits patient. She discussed patient recent ED visit at [facility] due to low blood pressure reading at home . Patient was diagnosed with UTI , prescribed antibiotic but no other medication changes. She reports visiting patient on yesterday and blood pressure readings was a lot better.  [name] was able to provide me with patient mobile number a home phone not working.   Placed call to patient mobile  number no answer voicemail not set up unable to leave a message.     Plan Will plan return call in the next 4 business days.    Joylene Draft, RN, BSN  [place] Management Coordinator  [phone]- Mobile [phone]- Toll Free Main Office

## 2020-05-24 ENCOUNTER — Telehealth: Payer: Self-pay | Admitting: *Deleted

## 2020-05-24 ENCOUNTER — Other Ambulatory Visit: Payer: Self-pay | Admitting: *Deleted

## 2020-05-24 DIAGNOSIS — D631 Anemia in chronic kidney disease: Secondary | ICD-10-CM | POA: Diagnosis not present

## 2020-05-24 DIAGNOSIS — E1122 Type 2 diabetes mellitus with diabetic chronic kidney disease: Secondary | ICD-10-CM | POA: Diagnosis not present

## 2020-05-24 DIAGNOSIS — I110 Hypertensive heart disease with heart failure: Secondary | ICD-10-CM | POA: Diagnosis not present

## 2020-05-24 DIAGNOSIS — I5023 Acute on chronic systolic (congestive) heart failure: Secondary | ICD-10-CM | POA: Diagnosis not present

## 2020-05-24 DIAGNOSIS — N1832 Chronic kidney disease, stage 3b: Secondary | ICD-10-CM | POA: Diagnosis not present

## 2020-05-24 DIAGNOSIS — I482 Chronic atrial fibrillation, unspecified: Secondary | ICD-10-CM | POA: Diagnosis not present

## 2020-05-24 NOTE — Patient Outreach (Signed)
[facility]) Care Management  [place]  05/24/2020   Denise Freeman 1950-12-13 [id number]   Telephone Assessment  Complex care management     Inpatient Admission at [facility] 10/18-10/20/21 ( left AMA) Dx: Hyperglycemia Hyperosmolar non-ketotic state. Inpatient admission [facility] 10/21-10/25/21 Acute Heart Failure ED visit [facility] 12/17, UTI ,Pneumonia  Patient is a 69 year old female with PMHX: Combined Systolic and Diastolic Heart Failure, Chronic hypoxic respiratory failure, chronic oxygen therapyat 3 litersOSA with Bipap, Chronic pain syndrome, hypertension,Chronic Atrial Fib,Diabetes( A1c 13.9% on 6/2/21per KPN)Depression with anxiety, partially blind both eyes, hard of hearing   Patient is a 69 year old female with PMHX: Combined Systolic and Diastolic Heart Failure, Chronic hypoxic respiratory failure, chronic oxygen therapyat 3 litersOSA with Bipap, Chronic pain syndrome, hypertension,Chronic Atrial Fib,Diabetes( A1c 13.9% on 6/2/21per KPN)Depression with anxiety, partially blind both eyes, hard of hearing .   Subjective:  Incoming call from patient, she discussed having difficulty with home phone number she provided contact number to reach her , updated in Epic.  Patient discussed concern regarding recent low pressure readings, visit to ED at [facility], due to low blood pressure,  Dx with UTI,PNA,  unable to take Cipro due to complaints it bothering her stomach and she is allergic to medication. She states she has been sick for the past few weeks, recently treated with zithromax for sinus infection. Patient voice is hoarse, has congested sounding cough, described thick tan secretions, she reports taking respiratory prescribed medications. Patient is wearing oxygen at 3 liters , with reported pulse oximeter  reading at 99%. Patient denies having a fever. She reports blood pressure today is 104/66, stating this is  lower than she usually runs. Patient reports decreased appetite intake, she reports blood sugar 124 this am, she denies low blood sugar reading.  She denies increase in weight, swelling weight at 171. Patient states that she is awaiting return call from provider at [facility] office regarding her current symptoms and what test showed from recent ED visit .Marland Kitchen  Reinforced with patient covid precautions, wearing mask, staying 6 feet apart . She states she had one vaccine in the past and has been having problems since.   Placed to call to [name] office regarding patient current conditions,able  to leave a message with representative for provider ,recent ED visit at [facility]  reviewed provider note from [facility] on 12/17 that reads, chest xray returns and is consistent with Covid 19 pneumonia vs bilateral pneumonia patient refused RPP swab per note, additionally patient found to have UTI. I didn't share this information with patient .       Patient also voiced concern regarding needing to know result of INR to coumadin dosing. She reports having visit from [employer] to assist with her home INR testing. Patient states result were  to be called in on that day and she has not heard back regarding coumadin dosing.  Placed call to [facility] respiratory department with MD INR, representative verifies INR results were faxed out on 12/21 to provider on file to receive results.   Placed call to Cardiology office to discuss patient concern regarding whether they have received recent INR results from home monitoring spoke with representative Tammy that will send message to Lattie Haw that usually follows up patient coumadin doses.   Encounter Medications:  Outpatient Encounter Medications as of 05/24/2020  Medication Sig Note  . albuterol (PROVENTIL) (2.5 MG/3ML) 0.083% nebulizer solution Take 3  mLs (2.5 mg total) by nebulization every 6 (six) hours as needed for wheezing or shortness of  breath.   . allopurinol (ZYLOPRIM) 100 MG tablet TAKE 1 TABLET BY MOUTH ONCE DAILY. (Patient taking differently: Take 100 mg by mouth daily. )   . budesonide (PULMICORT) 0.5 MG/2ML nebulizer solution Take 2 mLs (0.5 mg total) by nebulization 2 (two) times daily.   . cholecalciferol (VITAMIN D) 400 units TABS tablet Take 1 tablet (400 Units total) by mouth 2 (two) times daily.   . clobetasol cream (TEMOVATE) 0.16 % Apply 1 application topically daily.    . colchicine 0.6 MG tablet Take 1 tablet (0.6 mg total) by mouth as needed. (Patient taking differently: Take 0.6 mg by mouth as needed (gout). )   . escitalopram (LEXAPRO) 10 MG tablet Take 1 tablet (10 mg total) by mouth at bedtime.   . FEROSUL 325 (65 Fe) MG tablet Take 325 mg by mouth daily.    . fluticasone (FLONASE) 50 MCG/ACT nasal spray Place 1 spray into both nostrils 2 (two) times daily.   . furosemide (LASIX) 40 MG tablet Take 3 tablets (120 mg total) by mouth 2 (two) times daily.   . Insulin Glargine (BASAGLAR KWIKPEN) 100 UNIT/ML Inject 20 Units into the skin 2 (two) times daily.   . insulin lispro (HUMALOG) 100 UNIT/ML KiwkPen Inject 0.02 mLs (2 Units total) into the skin 3 (three) times daily before meals.   Marland Kitchen ipratropium (ATROVENT) 0.02 % nebulizer solution Take 2.5 mLs (0.5 mg total) by nebulization every 6 (six) hours as needed for wheezing or shortness of breath.   . metolazone (ZAROXOLYN) 2.5 MG tablet Take 1 tablet (2.5 mg total) by mouth daily.   . metoprolol succinate (TOPROL-XL) 25 MG 24 hr tablet TAKE 1 TABLET BY MOUTH TWICE DAILY (Patient taking differently: Take 25 mg by mouth in the morning and at bedtime. ) 03/22/2020: LF: 03/08/2020 DS:28   . mirtazapine (REMERON) 15 MG tablet Take 1 tablet (15 mg total) by mouth at bedtime. 03/22/2020: LF: 03/08/2020 DS:28   . modafinil (PROVIGIL) 200 MG tablet Take 1 tablet (200 mg total) by mouth daily. 03/22/2020: LF: 03/08/2020 DS:28   . montelukast (SINGULAIR) 10 MG tablet Take 1  tablet (10 mg total) by mouth at bedtime. 03/22/2020: LF: 03/08/2020 DS:28   . nitroGLYCERIN (NITROSTAT) 0.4 MG SL tablet Place 0.4 mg under the tongue every 5 (five) minutes as needed for chest pain.  03/22/2020: Pt hasn't needed  . oxyCODONE-acetaminophen (PERCOCET/ROXICET) 5-325 MG tablet Take 1 tablet by mouth 2 (two) times daily as needed for severe pain. 03/22/2020: LF:03/01/2020 DS:30   . pantoprazole (PROTONIX) 20 MG tablet TAKE (1) TABLET BY MOUTH TWICE A DAY BEFORE MEALS. (BREAKFAST AND SUPPER) (Patient taking differently: Take 20 mg by mouth 2 (two) times daily before a meal. ) 03/22/2020: LF: 03/08/2020 DS:28   . primidone (MYSOLINE) 50 MG tablet TAKE ONE TABLET BY MOUTH AT BEDTIME. (Patient taking differently: Take 50 mg by mouth at bedtime. ) 03/22/2020: LF: 03/08/2020 DS:28   . Probiotic Product (PROBIOTIC PO) Take 1 tablet by mouth at bedtime.   . TRUE METRIX BLOOD GLUCOSE TEST test strip USE TO TEST BLOOD SUGAR AS DIRECTED.   . Vitamin A 2400 MCG (8000 UT) CAPS Take 8,000 Units by mouth every morning.  03/22/2020: LF: 03/08/2020 DS: 28  . Vitamin D, Ergocalciferol, (DRISDOL) 1.25 MG (50000 UNIT) CAPS capsule Take 1 capsule (50,000 Units total) by mouth every 7 (seven) days.   Marland Kitchen  warfarin (COUMADIN) 4 MG tablet TAKE 1 TABLET BY MOUTH ONCE A DAY OR AS DIRECTED BY THE COUMADIN CLINIC   . [DISCONTINUED] potassium chloride 20 MEQ TBCR Take 20 mEq by mouth 2 (two) times daily.    No facility-administered encounter medications on file as of 05/24/2020.    Functional Status:  In your present state of health, do you have any difficulty performing the following activities: 03/23/2020 03/20/2020  Hearing? [place]? Y -  Difficulty concentrating or making decisions? N -  Walking or climbing stairs? Y -  Comment - -  Dressing or bathing? Y -  Comment - -  Doing errands, shopping? [place] and eating ? - -  Comment - -  Using the Toilet? - -  In the past six  months, have you accidently leaked urine? - -  Do you have problems with loss of bowel control? - -  Managing your Medications? - -  Managing your Finances? - -  Comment - -  Housekeeping or managing your Housekeeping? - -  Comment - -  Some recent data might be hidden    Fall/Depression Screening: Fall Risk  02/01/2020 06/11/2019 05/17/2019  Falls in the past year? 1 1 1   Number falls in past yr: 0 1 1  Injury with Fall? 0 1 1  Comment - - -  Risk Factor Category  - - -  Comment - - -  Risk for fall due to : History of fall(s);Impaired balance/gait;Impaired vision History of fall(s) History of fall(s);Impaired vision;Impaired mobility  Risk for fall due to: Comment - - -  Follow up Falls prevention discussed Falls evaluation completed;Education provided Education provided;Falls prevention discussed   PHQ 2/9 Scores 05/17/2019 11/28/2017 11/27/2017 10/03/2017 09/15/2017 09/05/2017 08/07/2017  PHQ - 2 Score 0 0 5 6 0 0 0  PHQ- 9 Score - - - 23 - - -    Assessment:  Goals Addressed            This Visit's Progress   . Make and Keep All Appointments   On track    Follow Up Date 07/03/20 Timeframe:  Long-Range Goal Priority:  High Start Date:    03/29/20                         Expected End Date: 07/03/20                        - call to cancel if needed - keep a calendar with appointment dates Reschedule missed appointments     Why is this important?   Part of staying healthy is seeing the doctor for follow-up care.  If you forget your appointments, there are some things you can do to stay on track.    Notes:     Marland Kitchen Manage My Medicine       Follow Up Date 07/03/20 Timeframe:  Long-Range Goal Priority:  High Start Date:     03/14/20                       Expected End Date:  07/03/20                       - call for medicine refill 2 or 3 days before it runs out  - take medications as prescribed per most recent after visit summary  Why is this important?   These steps will  help you keep on track with your medicines.    Notes:  Pharmacy referral due to cost of insulins    . Monitor and Manage My Blood Sugar       Follow Up Date 07/03/20 Timeframe:  Long-Range Goal Priority:  High Start Date:  03/14/20                           Expected End Date:  07/03/20                       - check blood sugar at prescribed times - check blood sugar if I feel it is too high or too low - take the blood sugar meter to all doctor visits  Replace libre sensor to monitor blood sugars - with spouse assistance    Why is this important?   Checking your blood sugar at home helps to keep it from getting very high or very low.  Writing the results in a diary or log helps the doctor know how to care for you.  Your blood sugar log should have the time, date and the results.  Also, write down the amount of insulin or other medicine that you take.  Other information, like what you ate, exercise done and how you were feeling, will also be helpful.     Notes:     . Perform Foot Care   On track    Follow Up Date 07/03/20 Timeframe:  Short-Term Goal Priority:  Medium Start Date:  03/14/20                           Expected End Date:  06/23/20                       - check feet daily for cuts, sores or redness - wash and dry feet carefully every day - wear comfortable, cotton socks - wear comfortable, well-fitting shoes    Why is this important?   Good foot care is very important when you have diabetes.  There are many things you can do to keep your feet healthy and catch a problem early.    Notes:  Encourage spouse to assist with observance of feet and with care     . Set My Target A1C   Not on track    Follow Up Date 07/03/20 Timeframe:  Long-Range Goal Priority:  Medium Start Date:    03/14/20                         Expected End Date:   07/03/20                      - set target A1C    Why is this important?   Your target A1C is decided together by you and your  doctor.  It is based on several things like your age and other health issues.    Notes:  Call to PCP office regarding Ac1    . Track and Manage Fluids and Swelling   On track    Follow Up Date 07/03/20 Timeframe:  Long-Range Goal Priority:  Medium Start Date:     03/14/20  Expected End Date:   07/03/20                      - call office if I gain more than 2 pounds in one day or 5 pounds in one week - do ankle pumps when sitting - keep legs up while sitting - track weight in diary - watch for swelling in feet, ankles and legs every day - weigh myself daily    Why is this important?   It is important to check your weight daily and watch how much salt and liquids you have.  It will help you to manage your heart failure.    Notes:     . Track and Manage Symptoms   On track    Follow Up Date 07/03/20 Timeframe:  Long-Range Goal Priority:  Medium Start Date:   03/14/20                          Expected End Date:07/03/20                         - bring diary to all appointments - follow rescue plan if symptoms flare-up - know when to call the doctor - dress right for the weather, hot or cold    Why is this important?   You will be able to handle your symptoms better if you keep track of them.  Making some simple changes to your lifestyle will help.  Eating healthy is one thing you can do to take good care of yourself.    Notes:      Contact with patient PCP regarding patient current medical concerns for medical follow up.    Plan:  Follow-up:  Patient agrees to Care Plan and Follow-up., patient agreeable to  call in next day for follow up on current medical condition and care coordination calls on today.  Reinforced with patient notifying MD for new concern of worsening  shortness of breath, cough fever, sudden weight gain and 911 for emergency.   Joylene Draft, RN, BSN  [place] Management Coordinator  [phone]-  Mobile [phone]- Toll Free Main Office

## 2020-05-24 NOTE — Telephone Encounter (Signed)
Please call pt concerning results- wanting to know if you received home INR reading.                                                                                                                                                                              Try both numbers on file

## 2020-05-24 NOTE — Telephone Encounter (Signed)
Tried returning pt's call multiple times.  No answer on home phone and cell phone is a wrong number.  Will try again tomorrow.

## 2020-05-25 ENCOUNTER — Other Ambulatory Visit: Payer: Self-pay | Admitting: *Deleted

## 2020-05-25 NOTE — Patient Outreach (Signed)
[facility]) Care Management  05/25/2020  Denise Freeman 1950/08/15 [id number]   Care Coordination  Complex care management     Inpatient Admission at [facility] 10/18-10/20/21 ( left AMA) Dx: Hyperglycemia Hyperosmolar non-ketotic state. Inpatient admission [facility] 10/21-10/25/21 Acute Heart Failure ED visit [facility] 12/17, UTI ,Pneumonia  Patient is a 69 year old female with PMHX: Combined Systolic and Diastolic Heart Failure, Chronic hypoxic respiratory failure, chronic oxygen therapyat 3 litersOSA with Bipap, Chronic pain syndrome, hypertension,Chronic Atrial Fib,Diabetes( A1c 13.9% on 6/2/21per KPN)Depression with anxiety, partially blind both eyes, hard of hearing   Patient is a 69 year old female with PMHX: Combined Systolic and Diastolic Heart Failure, Chronic hypoxic respiratory failure, chronic oxygen therapyat 3 litersOSA with Bipap, Chronic pain syndrome, hypertension,Chronic Atrial Fib,Diabetes( A1c 13.9% on 6/2/21per KPN)Depression with anxiety, partially blind both eyes, hard of hearing .  Subjective Unsuccessful outreach call to patient home number [phone]  no answer able to leave a HIPAA compliant voicemail message.  Placed call to contact numbers patient provided me with on yesterday as she was having difficulty with home number, she states try these numbers:   [phone] no voice mail set up unable to leave a message. Patient called me on yesterday from [phone] no answer at number no voicemail set up unable to leave a message.  Placed call to PCP office they report having home number as patient contact.   1400  Subjective: Successful return call to patient at [phone] patient reports feeling some better on today, trying to get things ready for christmas.  Patient report oxygen saturation at 96%, with occasional cough mostly nonproductive, speaking in complete sentences. She denies fever, unable  to state blood pressure today but did not voice concern for low reading.  Patient reports contacting PCP office again on today, and providing them her contact number.  Reinforced steps to  staying safe during covid pandemic.  Patient states that she has heard from [place] at cardiology office regarding INR and she is following up.   Assessment Will benefit from ongoing follow up for care coordination, support with management of chronic conditions.    Plan Patient agreeable to follow up call in the next week.  Reinforced worsening symptoms to see medical attention for.    Joylene Draft, RN, BSN  [place] Management Coordinator  [phone]- Mobile [phone]- Toll Free Main Office

## 2020-05-29 ENCOUNTER — Other Ambulatory Visit: Payer: Self-pay

## 2020-05-29 ENCOUNTER — Ambulatory Visit (INDEPENDENT_AMBULATORY_CARE_PROVIDER_SITE_OTHER): Payer: Medicare Other | Admitting: *Deleted

## 2020-05-29 DIAGNOSIS — S0093XA Contusion of unspecified part of head, initial encounter: Secondary | ICD-10-CM | POA: Diagnosis not present

## 2020-05-29 DIAGNOSIS — Z5181 Encounter for therapeutic drug level monitoring: Secondary | ICD-10-CM | POA: Diagnosis not present

## 2020-05-29 DIAGNOSIS — R519 Headache, unspecified: Secondary | ICD-10-CM | POA: Diagnosis not present

## 2020-05-29 DIAGNOSIS — I4891 Unspecified atrial fibrillation: Secondary | ICD-10-CM

## 2020-05-29 LAB — POCT INR: INR: 3.6 — AB (ref 2.0–3.0)

## 2020-05-29 NOTE — Patient Instructions (Signed)
Has home monitor now.  Self-tester Hold warfarin tonight then resume 4mg  daily. Recheck in 3 wks

## 2020-05-29 NOTE — Telephone Encounter (Signed)
Have be unable to reach [place] about pt's INR machine.  Appt made for pt to come in today for INR check but unablet to reach pt by phone.  No voicemail set up.

## 2020-05-29 NOTE — Telephone Encounter (Signed)
Pt came in to [place] office today for INR check.  Instructions given and follow up appt made till [place] gets her monitor for fixed.

## 2020-05-30 ENCOUNTER — Other Ambulatory Visit: Payer: Self-pay | Admitting: *Deleted

## 2020-05-30 ENCOUNTER — Encounter (INDEPENDENT_AMBULATORY_CARE_PROVIDER_SITE_OTHER): Payer: Medicare Other | Admitting: Ophthalmology

## 2020-05-30 ENCOUNTER — Other Ambulatory Visit: Payer: Self-pay | Admitting: Internal Medicine

## 2020-05-30 ENCOUNTER — Other Ambulatory Visit (HOSPITAL_COMMUNITY): Payer: Self-pay | Admitting: Internal Medicine

## 2020-05-30 DIAGNOSIS — S0093XA Contusion of unspecified part of head, initial encounter: Secondary | ICD-10-CM

## 2020-05-30 DIAGNOSIS — R519 Headache, unspecified: Secondary | ICD-10-CM

## 2020-05-30 NOTE — Patient Outreach (Signed)
[facility]) Care Management  [place]  05/30/2020   Denise Freeman March 22, 1951 [id number]    Telephone Assessment  Complex care management     Inpatient Admission at [facility] 10/18-10/20/21 ( left AMA) Dx: Hyperglycemia Hyperosmolar non-ketotic state. Inpatient admission [facility] 10/21-10/25/21 Acute Heart Failure ED visit [facility] 12/17, UTI ,Pneumonia   Patient is a 69 year old female with PMHX: Combined Systolic and Diastolic Heart Failure, Chronic hypoxic respiratory failure, chronic oxygen therapyat 3 litersOSA with Bipap, Chronic pain syndrome, hypertension,Chronic Atrial Fib,Diabetes( A1c 13.9% on 6/2/21per [facility])Depression with anxiety, partially blind both eyes, hard of hearing     Outreach Attempt Subjective: Successful outreach call to patient, she reports beginning to feel better. She discussed visit with PCP on yesterday, states she has a cold and has been started on antibiotic doxycyline. Patient discussed having a fall in the last few weeks during the time she had low blood pressure reading and then visit to ED at [facility]. She reported feeling dizzy and hitting her head against head of bed. She reports having a raised area on head, no increase in size, observed during visit with PCP and CT scan scheduled. Patient denies feeling dizzy at this time. She reports her blood pressure being 114/92 on today. She states being instructed to call MD office if blood pressure at 90/. She states during visit with PCP plans to follow up with Pharmacist in office to review medications.  Patient reports plan for PT home visit on Wednesday of this week.  Patient discussed continued monitoring oxygen levels with saturation in the high 90's with oxygen at 3 liters, no worsening of cough, just voice hoarse. She continues to monitor blood sugars, provider able to view her log at visit with improved readings. New libre sensor placed  during visit.  Patient states that she discussed referral to Palliative care program with provider at visit and they will follow up. She states that she will be seeing another provider in office as [name], is no longer at office.   Encounter Medications:  Outpatient Encounter Medications as of 05/30/2020  Medication Sig Note  . albuterol (PROVENTIL) (2.5 MG/3ML) 0.083% nebulizer solution Take 3 mLs (2.5 mg total) by nebulization every 6 (six) hours as needed for wheezing or shortness of breath.   . allopurinol (ZYLOPRIM) 100 MG tablet TAKE 1 TABLET BY MOUTH ONCE DAILY. (Patient taking differently: Take 100 mg by mouth daily. )   . budesonide (PULMICORT) 0.5 MG/2ML nebulizer solution Take 2 mLs (0.5 mg total) by nebulization 2 (two) times daily.   . cholecalciferol (VITAMIN D) 400 units TABS tablet Take 1 tablet (400 Units total) by mouth 2 (two) times daily.   . clobetasol cream (TEMOVATE) 8.67 % Apply 1 application topically daily.    . colchicine 0.6 MG tablet Take 1 tablet (0.6 mg total) by mouth as needed. (Patient taking differently: Take 0.6 mg by mouth as needed (gout). )   . escitalopram (LEXAPRO) 10 MG tablet Take 1 tablet (10 mg total) by mouth at bedtime.   . FEROSUL 325 (65 Fe) MG tablet Take 325 mg by mouth daily.    . fluticasone (FLONASE) 50 MCG/ACT nasal spray Place 1 spray into both nostrils 2 (two) times daily.   . furosemide (LASIX) 40 MG tablet Take 3 tablets (120 mg total) by mouth 2 (two) times daily.   . Insulin Glargine (BASAGLAR KWIKPEN) 100 UNIT/ML Inject 20 Units into the skin 2 (two) times  daily.   . insulin lispro (HUMALOG) 100 UNIT/ML KiwkPen Inject 0.02 mLs (2 Units total) into the skin 3 (three) times daily before meals.   Marland Kitchen ipratropium (ATROVENT) 0.02 % nebulizer solution Take 2.5 mLs (0.5 mg total) by nebulization every 6 (six) hours as needed for wheezing or shortness of breath.   . metolazone (ZAROXOLYN) 2.5 MG tablet Take 1 tablet (2.5 mg total) by  mouth daily.   . metoprolol succinate (TOPROL-XL) 25 MG 24 hr tablet TAKE 1 TABLET BY MOUTH TWICE DAILY (Patient taking differently: Take 25 mg by mouth in the morning and at bedtime. ) 03/22/2020: LF: 03/08/2020 DS:28   . mirtazapine (REMERON) 15 MG tablet Take 1 tablet (15 mg total) by mouth at bedtime. 03/22/2020: LF: 03/08/2020 DS:28   . modafinil (PROVIGIL) 200 MG tablet Take 1 tablet (200 mg total) by mouth daily. 03/22/2020: LF: 03/08/2020 DS:28   . montelukast (SINGULAIR) 10 MG tablet Take 1 tablet (10 mg total) by mouth at bedtime. 03/22/2020: LF: 03/08/2020 DS:28   . nitroGLYCERIN (NITROSTAT) 0.4 MG SL tablet Place 0.4 mg under the tongue every 5 (five) minutes as needed for chest pain.  03/22/2020: Pt hasn't needed  . oxyCODONE-acetaminophen (PERCOCET/ROXICET) 5-325 MG tablet Take 1 tablet by mouth 2 (two) times daily as needed for severe pain. 03/22/2020: LF:03/01/2020 DS:30   . pantoprazole (PROTONIX) 20 MG tablet TAKE (1) TABLET BY MOUTH TWICE A DAY BEFORE MEALS. (BREAKFAST AND SUPPER) (Patient taking differently: Take 20 mg by mouth 2 (two) times daily before a meal. ) 03/22/2020: LF: 03/08/2020 DS:28   . primidone (MYSOLINE) 50 MG tablet TAKE ONE TABLET BY MOUTH AT BEDTIME. (Patient taking differently: Take 50 mg by mouth at bedtime. ) 03/22/2020: LF: 03/08/2020 DS:28   . Probiotic Product (PROBIOTIC PO) Take 1 tablet by mouth at bedtime.   . TRUE METRIX BLOOD GLUCOSE TEST test strip USE TO TEST BLOOD SUGAR AS DIRECTED.   . Vitamin A 2400 MCG (8000 UT) CAPS Take 8,000 Units by mouth every morning.  03/22/2020: LF: 03/08/2020 DS: 28  . Vitamin D, Ergocalciferol, (DRISDOL) 1.25 MG (50000 UNIT) CAPS capsule Take 1 capsule (50,000 Units total) by mouth every 7 (seven) days.   Marland Kitchen warfarin (COUMADIN) 4 MG tablet TAKE 1 TABLET BY MOUTH ONCE A DAY OR AS DIRECTED BY THE COUMADIN CLINIC   . [DISCONTINUED] potassium chloride 20 MEQ TBCR Take 20 mEq by mouth 2 (two) times daily.    No  facility-administered encounter medications on file as of 05/30/2020.    Functional Status:  In your present state of health, do you have any difficulty performing the following activities: 03/23/2020 03/20/2020  Hearing? [place]? Y -  Difficulty concentrating or making decisions? N -  Walking or climbing stairs? Y -  Comment - -  Dressing or bathing? Y -  Comment - -  Doing errands, shopping? [place] and eating ? - -  Comment - -  Using the Toilet? - -  In the past six months, have you accidently leaked urine? - -  Do you have problems with loss of bowel control? - -  Managing your Medications? - -  Managing your Finances? - -  Comment - -  Housekeeping or managing your Housekeeping? - -  Comment - -  Some recent data might be hidden    Fall/Depression Screening: Fall Risk  02/01/2020 06/11/2019 05/17/2019  Falls in the past year? 1 1 1   Number  falls in past yr: 0 1 1  Injury with Fall? 0 1 1  Comment - - -  Risk Factor Category  - - -  Comment - - -  Risk for fall due to : History of fall(s);Impaired balance/gait;Impaired vision History of fall(s) History of fall(s);Impaired vision;Impaired mobility  Risk for fall due to: Comment - - -  Follow up Falls prevention discussed Falls evaluation completed;Education provided Education provided;Falls prevention discussed   PHQ 2/9 Scores 05/17/2019 11/28/2017 11/27/2017 10/03/2017 09/15/2017 09/05/2017 08/07/2017  PHQ - 2 Score 0 0 5 6 0 0 0  PHQ- 9 Score - - - 23 - - -    Assessment:  Goals Addressed            This Visit's Progress   . Make and Keep All Appointments   On track    Follow Up Date 07/03/20 Timeframe:  Long-Range Goal Priority:  High Start Date:    03/29/20                         Expected End Date: 07/03/20                        - call to cancel if needed - keep a calendar with appointment dates Reschedule missed appointments     Why is this important?   Part of staying healthy  is seeing the doctor for follow-up care.  If you forget your appointments, there are some things you can do to stay on track.    Notes:  Reviewed upcoming appointments     . Manage My Medicine   On track    Follow Up Date 07/03/20 Timeframe:  Long-Range Goal Priority:  High Start Date:     03/14/20                       Expected End Date:  07/03/20                       - call for medicine refill 2 or 3 days before it runs out  - take medications as prescribed per most recent after visit summary   Why is this important?   These steps will help you keep on track with your medicines.    Notes:  Pharmacy referral due to cost of insulins    . Monitor and Manage My Blood Sugar   On track    Follow Up Date 07/03/20 Timeframe:  Long-Range Goal Priority:  High Start Date:  03/14/20                           Expected End Date:  07/03/20                       - check blood sugar at prescribed times - check blood sugar if I feel it is too high or too low - take the blood sugar meter to all doctor visits  Replace libre sensor to monitor blood sugars - with spouse assistance    Why is this important?   Checking your blood sugar at home helps to keep it from getting very high or very low.  Writing the results in a diary or log helps the doctor know how to care for you.  Your blood sugar log should have the time, date and the  results.  Also, write down the amount of insulin or other medicine that you take.  Other information, like what you ate, exercise done and how you were feeling, will also be helpful.     Notes:     . Obtain Eye Exam   On track    Follow Up Date 06/01/20   - keep appointment with eye doctor  Rescheduled missed appointments    Why is this important?   Eye check-ups are important when you have diabetes.  Vision loss can be prevented.    Notes:  Has appointment on calendar for 06/28/20    . Perform Foot Care   On track    Follow Up Date 07/03/20 Timeframe:   Short-Term Goal Priority:  Medium Start Date:  03/14/20                           Expected End Date:  06/23/20                       - check feet daily for cuts, sores or redness - wash and dry feet carefully every day - wear comfortable, cotton socks - wear comfortable, well-fitting shoes    Why is this important?   Good foot care is very important when you have diabetes.  There are many things you can do to keep your feet healthy and catch a problem early.    Notes:  Encourage spouse to assist with observance of feet and with care     . Set My Target A1C   On track    Follow Up Date 07/03/20 Timeframe:  Long-Range Goal Priority:  Medium Start Date:    03/14/20                         Expected End Date:   07/03/20                      - set target A1C    Why is this important?   Your target A1C is decided together by you and your doctor.  It is based on several things like your age and other health issues.    Notes:  Call to PCP office regarding Ac1    . Track and Manage Activity and Exertion   On track    Follow Up Date 06/01/20   - pace activity allowing for rest  -participate with home health therapy    Why is this important?   Exercising is very important when managing your heart failure.  It will help your heart get stronger.    Notes:  Fall safety, use of walker, bedside commode     . Track and Manage Fluids and Swelling   On track    Follow Up Date 07/03/20 Timeframe:  Long-Range Goal Priority:  Medium Start Date:     03/14/20                        Expected End Date:   07/03/20                      - call office if I gain more than 2 pounds in one day or 5 pounds in one week - do ankle pumps when sitting - keep legs up while sitting - track weight in diary - watch for swelling in feet,  ankles and legs every day - weigh myself daily    Why is this important?   It is important to check your weight daily and watch how much salt and liquids you have.  It  will help you to manage your heart failure.    Notes:     . Track and Manage Symptoms   On track    Follow Up Date 07/03/20 Timeframe:  Long-Range Goal Priority:  Medium Start Date:   03/14/20                          Expected End Date:07/03/20                         - bring diary to all appointments - follow rescue plan if symptoms flare-up - know when to call the doctor - dress right for the weather, hot or cold    Why is this important?   You will be able to handle your symptoms better if you keep track of them.  Making some simple changes to your lifestyle will help.  Eating healthy is one thing you can do to take good care of yourself.    Notes:        Plan:  Follow-up:  Patient agrees to Care Plan and Follow-up. will schedule follow up telephone visit in the next 2 weeks.  Will send Patient [facility] calendar as requested.  Will follow up regarding provider plans of  Palliative care referral prior to next visit with patient,  along with  [facility] paramedic program, that patient also expressed interest if applicable to be in both .  Reinforced with patient notifying MD sooner of new concerns or worsening symptoms , low blood pressure reading, complaints of dizziness.     Joylene Draft, RN, BSN  [place] Management Coordinator  [phone]- Mobile [phone]- Toll Free Main Office

## 2020-05-31 DIAGNOSIS — D631 Anemia in chronic kidney disease: Secondary | ICD-10-CM | POA: Diagnosis not present

## 2020-05-31 DIAGNOSIS — I5023 Acute on chronic systolic (congestive) heart failure: Secondary | ICD-10-CM | POA: Diagnosis not present

## 2020-05-31 DIAGNOSIS — I482 Chronic atrial fibrillation, unspecified: Secondary | ICD-10-CM | POA: Diagnosis not present

## 2020-05-31 DIAGNOSIS — N1832 Chronic kidney disease, stage 3b: Secondary | ICD-10-CM | POA: Diagnosis not present

## 2020-05-31 DIAGNOSIS — I110 Hypertensive heart disease with heart failure: Secondary | ICD-10-CM | POA: Diagnosis not present

## 2020-05-31 DIAGNOSIS — E1122 Type 2 diabetes mellitus with diabetic chronic kidney disease: Secondary | ICD-10-CM | POA: Diagnosis not present

## 2020-06-01 ENCOUNTER — Other Ambulatory Visit: Payer: Self-pay | Admitting: Internal Medicine

## 2020-06-01 DIAGNOSIS — I482 Chronic atrial fibrillation, unspecified: Secondary | ICD-10-CM | POA: Diagnosis not present

## 2020-06-01 DIAGNOSIS — N1832 Chronic kidney disease, stage 3b: Secondary | ICD-10-CM | POA: Diagnosis not present

## 2020-06-01 DIAGNOSIS — E1122 Type 2 diabetes mellitus with diabetic chronic kidney disease: Secondary | ICD-10-CM | POA: Diagnosis not present

## 2020-06-01 DIAGNOSIS — I5023 Acute on chronic systolic (congestive) heart failure: Secondary | ICD-10-CM | POA: Diagnosis not present

## 2020-06-01 DIAGNOSIS — D631 Anemia in chronic kidney disease: Secondary | ICD-10-CM | POA: Diagnosis not present

## 2020-06-01 DIAGNOSIS — I110 Hypertensive heart disease with heart failure: Secondary | ICD-10-CM | POA: Diagnosis not present

## 2020-06-01 MED ORDER — FUROSEMIDE 40 MG PO TABS: 120.0000 mg | ORAL_TABLET | Freq: Two times a day (BID) | ORAL | 0 refills | Status: DC

## 2020-06-01 NOTE — Addendum Note (Signed)
Addended by: Wonda Horner on: 06/01/2020 10:52 AM   Modules accepted: Orders

## 2020-06-06 DIAGNOSIS — N1832 Chronic kidney disease, stage 3b: Secondary | ICD-10-CM | POA: Diagnosis not present

## 2020-06-06 DIAGNOSIS — I110 Hypertensive heart disease with heart failure: Secondary | ICD-10-CM | POA: Diagnosis not present

## 2020-06-06 DIAGNOSIS — I5023 Acute on chronic systolic (congestive) heart failure: Secondary | ICD-10-CM | POA: Diagnosis not present

## 2020-06-06 DIAGNOSIS — I482 Chronic atrial fibrillation, unspecified: Secondary | ICD-10-CM | POA: Diagnosis not present

## 2020-06-06 DIAGNOSIS — E1122 Type 2 diabetes mellitus with diabetic chronic kidney disease: Secondary | ICD-10-CM | POA: Diagnosis not present

## 2020-06-06 DIAGNOSIS — D631 Anemia in chronic kidney disease: Secondary | ICD-10-CM | POA: Diagnosis not present

## 2020-06-07 ENCOUNTER — Encounter: Payer: Self-pay | Admitting: Cardiology

## 2020-06-07 ENCOUNTER — Ambulatory Visit (INDEPENDENT_AMBULATORY_CARE_PROVIDER_SITE_OTHER): Payer: Medicare Other | Admitting: Cardiology

## 2020-06-07 VITALS — BP 130/60 | HR 99 | Resp 18 | Ht 59.0 in | Wt 168.6 lb

## 2020-06-07 DIAGNOSIS — D631 Anemia in chronic kidney disease: Secondary | ICD-10-CM | POA: Diagnosis not present

## 2020-06-07 DIAGNOSIS — I4891 Unspecified atrial fibrillation: Secondary | ICD-10-CM

## 2020-06-07 DIAGNOSIS — I5023 Acute on chronic systolic (congestive) heart failure: Secondary | ICD-10-CM | POA: Diagnosis not present

## 2020-06-07 DIAGNOSIS — I482 Chronic atrial fibrillation, unspecified: Secondary | ICD-10-CM | POA: Diagnosis not present

## 2020-06-07 DIAGNOSIS — I5032 Chronic diastolic (congestive) heart failure: Secondary | ICD-10-CM | POA: Diagnosis not present

## 2020-06-07 DIAGNOSIS — E1122 Type 2 diabetes mellitus with diabetic chronic kidney disease: Secondary | ICD-10-CM | POA: Diagnosis not present

## 2020-06-07 DIAGNOSIS — N1832 Chronic kidney disease, stage 3b: Secondary | ICD-10-CM | POA: Diagnosis not present

## 2020-06-07 DIAGNOSIS — I110 Hypertensive heart disease with heart failure: Secondary | ICD-10-CM | POA: Diagnosis not present

## 2020-06-07 DIAGNOSIS — N179 Acute kidney failure, unspecified: Secondary | ICD-10-CM | POA: Diagnosis not present

## 2020-06-07 MED ORDER — METOPROLOL SUCCINATE ER 25 MG PO TB24: 37.5000 mg | ORAL_TABLET | Freq: Every day | ORAL | 3 refills | Status: AC

## 2020-06-07 NOTE — Progress Notes (Signed)
Clinical Summary Denise Freeman is a 70 y.o.female seen today for follow up of the following medical problems.    1. Chronic combined systolic/diastolic HF - 06/7508 echo LVEF 50-55%, grade II DDx, mild RV dysfunction - recent admission 03/2020 with volume overload, was diuresed. Home lasix increased to 40mg  bid.  - ongoign issues with edema at discharge. Was taking lasix up to 120mg  bid.  - at tele visit with Dr Debara Pickett metolazone 2.5mg  daily added - from charting torsemide listed as ineffective for her.   - weight 168 lbs, 4 months ago 174 lbs - home weights 167-171 lbs.   - compliant with meds - some swelling at times - breathing is at her baseline  2. Permanent afib - prior issues with GI bleeding but has been on coumadin by primary cardiolgist without issues. - some recent palpitations, 2-3 times a day. Can last about 10 min   3. COPD  - on home O2  4. Low bp - ER visit 05/19/20 with low bp's 90s/50s - CXR with bilateral pneumonia, also found to have UTI. She refused covid test  BNP 8000 K 2.8 Cr 3.12 BUN 138  - baserline CR around 2.  - reports bp's are up and down.  - discharged on antibiotic, and followed up with pcp   5. CKD - followed by Dr Theador Hawthorne. Baseline Cr around 2, during recent ER visit CR 3 and BUN up to 138    Past Medical History:  Diagnosis Date  . Allergy   . Anemia   . Anxiety   . Asthma   . Atrial fibrillation ([place])   . Bipolar 1 disorder ([place])   . 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 Cholelithiasis    a. 09/2016 s/p Lap Chole.  . Chronic bronchitis ([place])   . Chronic combined systolic and diastolic congestive heart failure ([place])    a. 09/2016 Echo: EF 30-35%.  . Colon polyps   . COPD (chronic obstructive pulmonary disease) ([place])   . Depression   . Family history of adverse reaction to anesthesia    Uncle was positive for malignant hyperthermia; patient had testing done and was negative.  . Fibromyalgia   . GERD  (gastroesophageal reflux disease)   . Gout   . High cholesterol   . History of blood transfusion 06/2015   "bleeding from my rectum"  . History of hiatal hernia   . HOH (hard of hearing)   . Hx of colonic polyps 03/21/2016   3 small adenomas no recall - co-morbidities  . Neuropathy    Disc Back   . NICM (nonischemic cardiomyopathy) ([place])    a. Previously worked up in [place], [place]-->low EF with subsequent recovery.  Multiple caths (last ~ 2014 per pt report)--reportedly nl cors;  b. 09/2016 Echo: EF 30-35%, antsept/apical HK, mild MR, mildly dil LA, mod dil RA;  c. 09/2016 Lexi MV: EF 26%, glob HK, sept DK, med size, mod intensity fixed septal defect - BBB/PVC related artifact, no ischemia.  . On home oxygen therapy    "3L; 24/7" (03/14/2016)  . OSA treated with BiPAP    uses biPAP, 10 (03/14/2016)  . Osteoarthritis   . Oxygen deficiency   . Pneumonia   . Type II diabetes mellitus ([facility])      Allergies  Allergen Reactions  . Oseltamivir Hives    Other reaction(s): Other (see comments) "water blisters"  . Xarelto [Rivaroxaban] Other (See Comments)    Internal  bleeding  . Ancef [Cefazolin] Nausea And Vomiting  . Levaquin [Levofloxacin In D5w] Other (See Comments)    "afib"  . Lyrica [Pregabalin] Hives  . Tamiflu [Oseltamivir Phosphate] Other (See Comments)    "water blisters"  . Zoloft [Sertraline Hcl] Other (See Comments)    Jaw problems, jittery  . Torsemide Other (See Comments)    Not effective  . Augmentin [Amoxicillin-Pot Clavulanate] Itching  . Ciprofloxacin Itching and Nausea And Vomiting  . Haldol [Haloperidol] Other (See Comments)    Restless leg  . Nsaids Diarrhea  . Penicillins Itching, Nausea And Vomiting and Rash    Has patient had a PCN reaction causing immediate rash, facial/tongue/throat swelling, SOB or lightheadedness with hypotension: Yes Has patient had a PCN reaction causing severe rash involving mucus membranes or skin necrosis: No Has patient had a  PCN reaction that required hospitalization No Has patient had a PCN reaction occurring within the last 10 years: Yes If all of the above answers are "NO", then may proceed with Cephalosporin use.  . Topamax [Topiramate] Nausea Only     Current Outpatient Medications  Medication Sig Dispense Refill  . albuterol (PROVENTIL) (2.5 MG/3ML) 0.083% nebulizer solution Take 3 mLs (2.5 mg total) by nebulization every 6 (six) hours as needed for wheezing or shortness of breath. 75 mL 5  . allopurinol (ZYLOPRIM) 100 MG tablet TAKE 1 TABLET BY MOUTH ONCE DAILY. (Patient taking differently: Take 100 mg by mouth daily. ) 30 tablet 0  . budesonide (PULMICORT) 0.5 MG/2ML nebulizer solution Take 2 mLs (0.5 mg total) by nebulization 2 (two) times daily. 120 mL 11  . cholecalciferol (VITAMIN D) 400 units TABS tablet Take 1 tablet (400 Units total) by mouth 2 (two) times daily. 180 each 3  . clobetasol cream (TEMOVATE) 8.24 % Apply 1 application topically daily.     . colchicine 0.6 MG tablet Take 1 tablet (0.6 mg total) by mouth as needed. (Patient taking differently: Take 0.6 mg by mouth as needed (gout). )    . escitalopram (LEXAPRO) 10 MG tablet Take 1 tablet (10 mg total) by mouth at bedtime. 90 tablet 0  . FEROSUL 325 (65 Fe) MG tablet Take 325 mg by mouth daily.     . fluticasone (FLONASE) 50 MCG/ACT nasal spray Place 1 spray into both nostrils 2 (two) times daily.    . furosemide (LASIX) 40 MG tablet Take 3 tablets (120 mg total) by mouth 2 (two) times daily. 168 tablet 0  . Insulin Glargine (BASAGLAR KWIKPEN) 100 UNIT/ML Inject 20 Units into the skin 2 (two) times daily.    . insulin lispro (HUMALOG) 100 UNIT/ML KiwkPen Inject 0.02 mLs (2 Units total) into the skin 3 (three) times daily before meals. 15 mL 11  . ipratropium (ATROVENT) 0.02 % nebulizer solution Take 2.5 mLs (0.5 mg total) by nebulization every 6 (six) hours as needed for wheezing or shortness of breath. 62.5 mL 5  . metolazone (ZAROXOLYN)  2.5 MG tablet Take 1 tablet (2.5 mg total) by mouth daily. 90 tablet 3  . metoprolol succinate (TOPROL-XL) 25 MG 24 hr tablet TAKE 1 TABLET BY MOUTH TWICE DAILY (Patient taking differently: Take 25 mg by mouth in the morning and at bedtime. ) 60 tablet 11  . mirtazapine (REMERON) 15 MG tablet Take 1 tablet (15 mg total) by mouth at bedtime. 90 tablet 0  . modafinil (PROVIGIL) 200 MG tablet Take 1 tablet (200 mg total) by mouth daily. 30 tablet 5  . montelukast (SINGULAIR)  10 MG tablet Take 1 tablet (10 mg total) by mouth at bedtime. 30 tablet 5  . nitroGLYCERIN (NITROSTAT) 0.4 MG SL tablet Place 0.4 mg under the tongue every 5 (five) minutes as needed for chest pain.     Marland Kitchen oxyCODONE-acetaminophen (PERCOCET/ROXICET) 5-325 MG tablet Take 1 tablet by mouth 2 (two) times daily as needed for severe pain. 60 tablet 0  . pantoprazole (PROTONIX) 20 MG tablet TAKE (1) TABLET BY MOUTH TWICE A DAY BEFORE MEALS. (BREAKFAST AND SUPPER) (Patient taking differently: Take 20 mg by mouth 2 (two) times daily before a meal. ) 56 tablet 0  . primidone (MYSOLINE) 50 MG tablet TAKE ONE TABLET BY MOUTH AT BEDTIME. (Patient taking differently: Take 50 mg by mouth at bedtime. ) 30 tablet 5  . Probiotic Product (PROBIOTIC PO) Take 1 tablet by mouth at bedtime.    . TRUE METRIX BLOOD GLUCOSE TEST test strip USE TO TEST BLOOD SUGAR AS DIRECTED. 50 each 2  . Vitamin A 2400 MCG (8000 UT) CAPS Take 8,000 Units by mouth every morning.     . Vitamin D, Ergocalciferol, (DRISDOL) 1.25 MG (50000 UNIT) CAPS capsule Take 1 capsule (50,000 Units total) by mouth every 7 (seven) days. 12 capsule 0  . warfarin (COUMADIN) 4 MG tablet TAKE 1 TABLET BY MOUTH ONCE A DAY OR AS DIRECTED BY THE COUMADIN CLINIC 30 tablet 11   No current facility-administered medications for this visit.     Past Surgical History:  Procedure Laterality Date  . APPENDECTOMY     "they busted"  . bladder stimulator     pt states, "it cannot be turned off; it's in  my right hip; dead battery so it's not working anymore". (03/14/2016)  . BLADDER SUSPENSION     2003, 2006 and 2010  . CATARACT EXTRACTION W/PHACO Right 11/29/2014   Procedure: CATARACT EXTRACTION PHACO AND INTRAOCULAR LENS PLACEMENT (IOC);  Surgeon: Rutherford Guys, MD;  Location: [facility];  Service: Ophthalmology;  Laterality: Right;  CDE:3.81  . CATARACT EXTRACTION W/PHACO Left 12/13/2014   Procedure: CATARACT EXTRACTION PHACO AND INTRAOCULAR LENS PLACEMENT (IOC);  Surgeon: Rutherford Guys, MD;  Location: [facility];  Service: Ophthalmology;  Laterality: Left;  CDE:6.59  . CERVICAL DISC SURGERY N/A 2009   4, 6, and 7 cervical disc replaced  . CHOLECYSTECTOMY N/A 09/13/2016   Procedure: LAPAROSCOPIC CHOLECYSTECTOMY;  Surgeon: Rolm Bookbinder, MD;  Location: [facility];  Service: General;  Laterality: N/A;  . COLONOSCOPY WITH PROPOFOL N/A 03/15/2016   Procedure: COLONOSCOPY WITH PROPOFOL;  Surgeon: Gatha Mayer, MD;  Location: [facility];  Service: Endoscopy;  Laterality: N/A;  . ESOPHAGOGASTRODUODENOSCOPY (EGD) WITH PROPOFOL N/A 03/15/2016   Procedure: ESOPHAGOGASTRODUODENOSCOPY (EGD) WITH PROPOFOL;  Surgeon: Gatha Mayer, MD;  Location: [facility];  Service: Endoscopy;  Laterality: N/A;  . HEEL SPUR SURGERY Bilateral   . HERNIA REPAIR    . I & D EXTREMITY Right 06/13/2015   Procedure: MINOR IRRIGATION AND DEBRIDEMENT EXTREMITY REMOVAL OF NAIL;  Surgeon: Daryll Brod, MD;  Location: [facility];  Service: Orthopedics;  Laterality: Right;  . TUBAL LIGATION    . UMBILICAL HERNIA REPAIR     w/mesh     Allergies  Allergen Reactions  . Oseltamivir Hives    Other reaction(s): Other (see comments) "water blisters"  . Xarelto [Rivaroxaban] Other (See Comments)    Internal bleeding  . Ancef [Cefazolin] Nausea And Vomiting  . Levaquin [Levofloxacin In D5w] Other (See Comments)    "afib"  .  Lyrica [Pregabalin] Hives  . Tamiflu [Oseltamivir Phosphate] Other (See Comments)    "water  blisters"  . Zoloft [Sertraline Hcl] Other (See Comments)    Jaw problems, jittery  . Torsemide Other (See Comments)    Not effective  . Augmentin [Amoxicillin-Pot Clavulanate] Itching  . Ciprofloxacin Itching and Nausea And Vomiting  . Haldol [Haloperidol] Other (See Comments)    Restless leg  . Nsaids Diarrhea  . Penicillins Itching, Nausea And Vomiting and Rash    Has patient had a PCN reaction causing immediate rash, facial/tongue/throat swelling, SOB or lightheadedness with hypotension: Yes Has patient had a PCN reaction causing severe rash involving mucus membranes or skin necrosis: No Has patient had a PCN reaction that required hospitalization No Has patient had a PCN reaction occurring within the last 10 years: Yes If all of the above answers are "NO", then may proceed with Cephalosporin use.  . Topamax [Topiramate] Nausea Only      Family History  Problem Relation Age of Onset  . Heart disease Mother   . COPD Mother   . Diabetes Mother   . Breast cancer Mother   . Heart disease Father   . Hyperlipidemia Father   . COPD Sister   . Heart disease Sister   . Diabetes Sister   . Heart disease Maternal Grandmother   . Cancer Maternal Grandmother        stomach  . Cancer Maternal Grandfather        lung   . Bipolar disorder Brother      Social History Denise Freeman reports that she quit smoking about 31 years ago. Her smoking use included cigarettes. She has a 28.00 pack-year smoking history. She has never used smokeless tobacco. Denise Freeman reports no history of alcohol use.   Review of Systems CONSTITUTIONAL: No weight loss, fever, chills, weakness or fatigue.  HEENT: Eyes: No visual loss, blurred vision, double vision or yellow sclerae.No hearing loss, sneezing, congestion, runny nose or sore throat.  SKIN: No rash or itching.  CARDIOVASCULAR: per hpi RESPIRATORY: No shortness of breath, cough or sputum.  GASTROINTESTINAL: No anorexia, nausea, vomiting or diarrhea.  No abdominal pain or blood.  GENITOURINARY: No burning on urination, no polyuria NEUROLOGICAL: No headache, dizziness, syncope, paralysis, ataxia, numbness or tingling in the extremities. No change in bowel or bladder control.  MUSCULOSKELETAL: No muscle, back pain, joint pain or stiffness.  LYMPHATICS: No enlarged nodes. No history of splenectomy.  PSYCHIATRIC: No history of depression or anxiety.  ENDOCRINOLOGIC: No reports of sweating, cold or heat intolerance. No polyuria or polydipsia.  Marland Kitchen   Physical Examination Today's Vitals   06/07/20 1001  BP: 130/60  Pulse: 99  Resp: 18  SpO2: 98%  Weight: 168 lb 9.6 oz (76.5 kg)  Height: 4\' 11"  (1.499 m)   Body mass index is 34.05 kg/m.  Gen: resting comfortably, no acute distress HEENT: no scleral icterus, pupils equal round and reactive, no palptable cervical adenopathy,  CV: irreg, no m/r/g, no jvd Resp: Clear to auscultation bilaterally GI: abdomen is soft, non-tender, non-distended, normal bowel sounds, no hepatosplenomegaly MSK: extremities are warm, no edema.  Skin: warm, no rash Neuro:  no focal deficits Psych: appropriate affect     Assessment and Plan  1. Chronic diastolic HF - appears euvolemic by exam, though exam limited due to body habitus - very high BNP from recent ER visit, will repeat - on high dose diuretics, repeat kidney labs may need to stop metolazone.  2. AKI  on CKD - repeat labs, may need to adjust diuretics  3. Afib - some recent palpitations, increase toprol to 37.5mg  bid        Arnoldo Lenis, M.D.

## 2020-06-07 NOTE — Patient Instructions (Signed)
Medication Instructions:  INCREASE Toprol to 37.5 mg twice a day  *If you need a refill on your cardiac medications before your next appointment, please call your pharmacy*   Lab Work: Cbc,bmet, magnesium, bnp at [facility] labs   If you have labs (blood work) drawn today and your tests are completely normal, you will receive your results only by: Marland Kitchen MyChart Message (if you have MyChart) OR . A paper copy in the mail If you have any lab test that is abnormal or we need to change your treatment, we will call you to review the results.   Testing/Procedures: None today   Follow-Up: At [facility], you and your health needs are our priority.  As part of our continuing mission to provide you with exceptional heart care, we have created designated Provider Care Teams.  These Care Teams include your primary Cardiologist (physician) and Advanced Practice Providers (APPs -  Physician Assistants and Nurse Practitioners) who all work together to provide you with the care you need, when you need it.  We recommend signing up for the patient portal called "MyChart".  Sign up information is provided on this After Visit Summary.  MyChart is used to connect with patients for Virtual Visits (Telemedicine).  Patients are able to view lab/test results, encounter notes, upcoming appointments, etc.  Non-urgent messages can be sent to your provider as well.   To learn more about what you can do with MyChart, go to [email or web address].    Your next appointment:   1 month(s)  The format for your next appointment:   In Person  Provider:   You may see Carlyle Dolly, MD or the following Advanced Practice Provider on your designated Care Team:    Katina Dung, NP    Other Instructions None      Thank you for choosing [facility] !

## 2020-06-08 ENCOUNTER — Other Ambulatory Visit: Payer: Self-pay | Admitting: Cardiology

## 2020-06-08 DIAGNOSIS — E782 Mixed hyperlipidemia: Secondary | ICD-10-CM | POA: Diagnosis not present

## 2020-06-08 DIAGNOSIS — J302 Other seasonal allergic rhinitis: Secondary | ICD-10-CM | POA: Diagnosis not present

## 2020-06-08 DIAGNOSIS — E6609 Other obesity due to excess calories: Secondary | ICD-10-CM | POA: Diagnosis not present

## 2020-06-08 DIAGNOSIS — I5023 Acute on chronic systolic (congestive) heart failure: Secondary | ICD-10-CM | POA: Diagnosis not present

## 2020-06-08 DIAGNOSIS — G2581 Restless legs syndrome: Secondary | ICD-10-CM | POA: Diagnosis not present

## 2020-06-08 DIAGNOSIS — F411 Generalized anxiety disorder: Secondary | ICD-10-CM | POA: Diagnosis not present

## 2020-06-08 DIAGNOSIS — G250 Essential tremor: Secondary | ICD-10-CM | POA: Diagnosis not present

## 2020-06-08 DIAGNOSIS — F319 Bipolar disorder, unspecified: Secondary | ICD-10-CM | POA: Diagnosis not present

## 2020-06-08 DIAGNOSIS — E876 Hypokalemia: Secondary | ICD-10-CM | POA: Diagnosis not present

## 2020-06-08 DIAGNOSIS — M1000 Idiopathic gout, unspecified site: Secondary | ICD-10-CM | POA: Diagnosis not present

## 2020-06-08 DIAGNOSIS — G4733 Obstructive sleep apnea (adult) (pediatric): Secondary | ICD-10-CM | POA: Diagnosis not present

## 2020-06-08 DIAGNOSIS — I110 Hypertensive heart disease with heart failure: Secondary | ICD-10-CM | POA: Diagnosis not present

## 2020-06-08 DIAGNOSIS — D631 Anemia in chronic kidney disease: Secondary | ICD-10-CM | POA: Diagnosis not present

## 2020-06-08 DIAGNOSIS — G894 Chronic pain syndrome: Secondary | ICD-10-CM | POA: Diagnosis not present

## 2020-06-08 DIAGNOSIS — E1122 Type 2 diabetes mellitus with diabetic chronic kidney disease: Secondary | ICD-10-CM | POA: Diagnosis not present

## 2020-06-08 DIAGNOSIS — M797 Fibromyalgia: Secondary | ICD-10-CM | POA: Diagnosis not present

## 2020-06-08 DIAGNOSIS — J9611 Chronic respiratory failure with hypoxia: Secondary | ICD-10-CM | POA: Diagnosis not present

## 2020-06-08 DIAGNOSIS — I482 Chronic atrial fibrillation, unspecified: Secondary | ICD-10-CM | POA: Diagnosis not present

## 2020-06-08 DIAGNOSIS — N1832 Chronic kidney disease, stage 3b: Secondary | ICD-10-CM | POA: Diagnosis not present

## 2020-06-08 DIAGNOSIS — K219 Gastro-esophageal reflux disease without esophagitis: Secondary | ICD-10-CM | POA: Diagnosis not present

## 2020-06-08 DIAGNOSIS — J449 Chronic obstructive pulmonary disease, unspecified: Secondary | ICD-10-CM | POA: Diagnosis not present

## 2020-06-08 DIAGNOSIS — E559 Vitamin D deficiency, unspecified: Secondary | ICD-10-CM | POA: Diagnosis not present

## 2020-06-08 DIAGNOSIS — E1165 Type 2 diabetes mellitus with hyperglycemia: Secondary | ICD-10-CM | POA: Diagnosis not present

## 2020-06-08 DIAGNOSIS — M545 Low back pain, unspecified: Secondary | ICD-10-CM | POA: Diagnosis not present

## 2020-06-09 LAB — CBC
HCT: 34.1 % — ABNORMAL LOW (ref 35.0–45.0)
Hemoglobin: 11.4 g/dL — ABNORMAL LOW (ref 11.7–15.5)
MCH: 28.9 pg (ref 27.0–33.0)
MCHC: 33.4 g/dL (ref 32.0–36.0)
MCV: 86.3 fL (ref 80.0–100.0)
MPV: 11.2 fL (ref 7.5–12.5)
Platelets: 244 10*3/uL (ref 140–400)
RBC: 3.95 10*6/uL (ref 3.80–5.10)
RDW: 13.4 % (ref 11.0–15.0)
WBC: 10 10*3/uL (ref 3.8–10.8)

## 2020-06-09 LAB — BASIC METABOLIC PANEL
BUN/Creatinine Ratio: 63 (calc) — ABNORMAL HIGH (ref 6–22)
BUN: 111 mg/dL — ABNORMAL HIGH (ref 7–25)
CO2: 36 mmol/L — ABNORMAL HIGH (ref 20–32)
Calcium: 8.2 mg/dL — ABNORMAL LOW (ref 8.6–10.4)
Chloride: 89 mmol/L — ABNORMAL LOW (ref 98–110)
Creat: 1.76 mg/dL — ABNORMAL HIGH (ref 0.50–0.99)
Glucose, Bld: 99 mg/dL (ref 65–99)
Potassium: 3.2 mmol/L — ABNORMAL LOW (ref 3.5–5.3)
Sodium: 139 mmol/L (ref 135–146)

## 2020-06-09 LAB — BRAIN NATRIURETIC PEPTIDE: Brain Natriuretic Peptide: 177 pg/mL — ABNORMAL HIGH (ref <100)

## 2020-06-09 LAB — MAGNESIUM: Magnesium: 1.5 mg/dL (ref 1.5–2.5)

## 2020-06-13 ENCOUNTER — Encounter (INDEPENDENT_AMBULATORY_CARE_PROVIDER_SITE_OTHER): Payer: Medicare Other | Admitting: Ophthalmology

## 2020-06-14 ENCOUNTER — Other Ambulatory Visit: Payer: Self-pay | Admitting: *Deleted

## 2020-06-14 NOTE — Patient Outreach (Signed)
[facility]) Care Management  [place]  06/14/2020   Denise Freeman [birth year] [id number]   Telephone Assessment Complex caremanagement   Inpatient Admission at [facility] 10/18-10/20/21 ( left AMA) Dx: Hyperglycemia Hyperosmolar non-ketotic state. Inpatient admission [facility] 10/21-10/25/21 Acute Heart Failure ED visit [facility] 12/17, UTI ,Pneumonia   Patient is a 70 year old female with PMHX: Combined Systolic and Diastolic Heart Failure, Chronic hypoxic respiratory failure, chronic oxygen therapyat 3 litersOSA with Bipap, Chronic pain syndrome, hypertension,Chronic Atrial Fib,Diabetes( A1c 13.9% on 6/2/21per [facility])Depression with anxiety, partially blind both eyes, hard of hearing    Subjective:  Unsuccessful outreach call to patient home and mobile number no answer, voicemail not set up.   Received return call from number listed for patient, [phone], person states that this number does not belong to person we are trying to reach as she has received calls prior to this . Removed number from patient list. Placed call to Denise Freeman with [facility], she confirms patient contact number is [phone] as she received call from patient yesterday.    Placed to Denise Freeman , NP office able to leave a message regarding patient interest in home visit program with  [facility] for visits regarding conditions of heart failure and COPD. Explained that we are in process of completing necessary patient consent and authorization forms to release health information of current history and physical , medication history.  Requested return call for questions.    Plan:  Follow-up:  Will plan return call to patient in the next 4 business days if no return call. Will await return response from provider office, and follow up call to  patient prior to mailing forms for signature regarding Par[facility] consent.   Joylene Draft, RN, BSN  [place] Management Coordinator  [phone]- Mobile [phone]- Toll Free Main Office

## 2020-06-15 ENCOUNTER — Other Ambulatory Visit: Payer: Self-pay | Admitting: *Deleted

## 2020-06-15 DIAGNOSIS — E1165 Type 2 diabetes mellitus with hyperglycemia: Secondary | ICD-10-CM | POA: Diagnosis not present

## 2020-06-15 DIAGNOSIS — I110 Hypertensive heart disease with heart failure: Secondary | ICD-10-CM | POA: Diagnosis not present

## 2020-06-15 DIAGNOSIS — D631 Anemia in chronic kidney disease: Secondary | ICD-10-CM | POA: Diagnosis not present

## 2020-06-15 DIAGNOSIS — N1832 Chronic kidney disease, stage 3b: Secondary | ICD-10-CM | POA: Diagnosis not present

## 2020-06-15 DIAGNOSIS — I5023 Acute on chronic systolic (congestive) heart failure: Secondary | ICD-10-CM | POA: Diagnosis not present

## 2020-06-15 DIAGNOSIS — E1122 Type 2 diabetes mellitus with diabetic chronic kidney disease: Secondary | ICD-10-CM | POA: Diagnosis not present

## 2020-06-15 NOTE — Patient Outreach (Signed)
[facility]) Care Management  [place]  06/15/2020   Denise Freeman 04-May-1951 [id number]   Telephone Assessment Complex caremanagement   Inpatient Admission at [facility] 10/18-10/20/21 ( left AMA) Dx: Hyperglycemia Hyperosmolar non-ketotic state. Inpatient admission [facility] 10/21-10/25/21 Acute Heart Failure ED visit [facility] 12/17, UTI ,Pneumonia   Patient is a 70 year old female with PMHX: Combined Systolic and Diastolic Heart Failure, Chronic hypoxic respiratory failure, chronic oxygen therapyat 3 litersOSA with Bipap, Chronic pain syndrome, hypertension,Chronic Atrial Fib,Diabetes( A1c 13.9% on 6/2/21per [facility])Depression with anxiety, partially blind both eyes, hard of hearing   Subjective:  Successful outreach call to patient, she reports dong better, reports decrease cough clear sputum. Patient discussed some difficulty with her phone , she provided me with her 2 current number, will update in Epic.   Patient discussed that her weight is staying around 167 to 170 no sudden weight gain and reports decrease in waist measurement per home health RN.   Patient reports having recent change in senor of freestyle libre, reports monitoring 4 times a day, reading this am 136, has experienced reading in 200's in the evenings one recent elevation of 340 but not consistent in that range.   Discussed upcoming potential winter storm, patient reports having food in home,she plans to call [facility] regarding and additional oxygen tank. Patient reports she has contacted PCP office as well as [facility] today calling regarding needing refill on her pain medications, she reports pharmacy contact to office.   Discussed with patient the paramedicine program and she is agreeable to program.     Encounter Medications:  Outpatient Encounter Medications as of 06/15/2020  Medication Sig Note  . albuterol (PROVENTIL) (2.5 MG/3ML) 0.083%  nebulizer solution Take 3 mLs (2.5 mg total) by nebulization every 6 (six) hours as needed for wheezing or shortness of breath.   . allopurinol (ZYLOPRIM) 100 MG tablet TAKE 1 TABLET BY MOUTH ONCE DAILY. (Patient taking differently: Take 100 mg by mouth daily.)   . budesonide (PULMICORT) 0.5 MG/2ML nebulizer solution Take 2 mLs (0.5 mg total) by nebulization 2 (two) times daily.   . cetirizine (ZYRTEC) 10 MG tablet Take 10 mg by mouth daily.   . cholecalciferol (VITAMIN D) 400 units TABS tablet Take 1 tablet (400 Units total) by mouth 2 (two) times daily.   . clobetasol cream (TEMOVATE) 3.81 % Apply 1 application topically daily.    . colchicine 0.6 MG tablet Take 0.6 mg by mouth daily as needed. gout   . FEROSUL 325 (65 Fe) MG tablet Take 325 mg by mouth daily.    . fluticasone (FLONASE) 50 MCG/ACT nasal spray Place 1 spray into both nostrils 2 (two) times daily.   . furosemide (LASIX) 40 MG tablet Take 3 tablets (120 mg total) by mouth 2 (two) times daily.   . Insulin Glargine (BASAGLAR KWIKPEN) 100 UNIT/ML Inject 20 Units into the skin 2 (two) times daily.   . insulin lispro (HUMALOG) 100 UNIT/ML KiwkPen Inject 0.02 mLs (2 Units total) into the skin 3 (three) times daily before meals.   Marland Kitchen ipratropium (ATROVENT) 0.02 % nebulizer solution Take 2.5 mLs (0.5 mg total) by nebulization every 6 (six) hours as needed for wheezing or shortness of breath.   . metolazone (ZAROXOLYN) 2.5 MG tablet Take 1 tablet (2.5 mg total) by mouth daily.   . metoprolol succinate (TOPROL XL) 25 MG 24 hr tablet Take 1.5 tablets (37.5 mg total) by mouth daily.   Marland Kitchen  mirtazapine (REMERON) 15 MG tablet Take 1 tablet (15 mg total) by mouth at bedtime. 03/22/2020: LF: 03/08/2020 DS:28   . modafinil (PROVIGIL) 200 MG tablet Take 1 tablet (200 mg total) by mouth daily. 03/22/2020: LF: 03/08/2020 DS:28   . montelukast (SINGULAIR) 10 MG tablet Take 1 tablet (10 mg total) by mouth at bedtime. 03/22/2020: LF: 03/08/2020 DS:28   .  nitroGLYCERIN (NITROSTAT) 0.4 MG SL tablet Place 0.4 mg under the tongue every 5 (five) minutes as needed for chest pain.  03/22/2020: Pt hasn't needed  . oxyCODONE-acetaminophen (PERCOCET/ROXICET) 5-325 MG tablet Take 1 tablet by mouth 2 (two) times daily as needed for severe pain. 03/22/2020: LF:03/01/2020 DS:30   . pantoprazole (PROTONIX) 20 MG tablet TAKE (1) TABLET BY MOUTH TWICE A DAY BEFORE MEALS. (BREAKFAST AND SUPPER) (Patient taking differently: Take 20 mg by mouth 2 (two) times daily before a meal.) 03/22/2020: LF: 03/08/2020 DS:28   . primidone (MYSOLINE) 50 MG tablet TAKE ONE TABLET BY MOUTH AT BEDTIME. (Patient taking differently: Take 50 mg by mouth at bedtime.) 03/22/2020: LF: 03/08/2020 DS:28   . Probiotic Product (PROBIOTIC PO) Take 1 tablet by mouth at bedtime.   . TRUE METRIX BLOOD GLUCOSE TEST test strip USE TO TEST BLOOD SUGAR AS DIRECTED.   . Vitamin A 2400 MCG (8000 UT) CAPS Take 8,000 Units by mouth every morning.  03/22/2020: LF: 03/08/2020 DS: 28  . Vitamin D, Ergocalciferol, (DRISDOL) 1.25 MG (50000 UNIT) CAPS capsule Take 1 capsule (50,000 Units total) by mouth every 7 (seven) days.   Marland Kitchen warfarin (COUMADIN) 4 MG tablet TAKE 1 TABLET BY MOUTH ONCE A DAY OR AS DIRECTED BY THE COUMADIN CLINIC   . [DISCONTINUED] potassium chloride 20 MEQ TBCR Take 20 mEq by mouth 2 (two) times daily.    No facility-administered encounter medications on file as of 06/15/2020.    Functional Status:  In your present state of health, do you have any difficulty performing the following activities: 03/23/2020 03/20/2020  Hearing? [place]? Y -  Difficulty concentrating or making decisions? N -  Walking or climbing stairs? Y -  Comment - -  Dressing or bathing? Y -  Comment - -  Doing errands, shopping? [place] and eating ? - -  Comment - -  Using the Toilet? - -  In the past six months, have you accidently leaked urine? - -  Do you have problems with loss of  bowel control? - -  Managing your Medications? - -  Managing your Finances? - -  Comment - -  Housekeeping or managing your Housekeeping? - -  Comment - -  Some recent data might be hidden    Fall/Depression Screening: Fall Risk  02/01/2020 06/11/2019 05/17/2019  Falls in the past year? 1 1 1   Number falls in past yr: 0 1 1  Injury with Fall? 0 1 1  Comment - - -  Risk Factor Category  - - -  Comment - - -  Risk for fall due to : History of fall(s);Impaired balance/gait;Impaired vision History of fall(s) History of fall(s);Impaired vision;Impaired mobility  Risk for fall due to: Comment - - -  Follow up Falls prevention discussed Falls evaluation completed;Education provided Education provided;Falls prevention discussed   PHQ 2/9 Scores 05/17/2019 11/28/2017 11/27/2017 10/03/2017 09/15/2017 09/05/2017 08/07/2017  PHQ - 2 Score 0 0 5 6 0 0 0  PHQ- 9 Score - - - 23 - - -  Assessment:  Goals Addressed            This Visit's Progress   . Make and Keep All Appointments   On track    Follow Up Date 07/03/20 Timeframe:  Long-Range Goal Priority:  High Start Date:    03/29/20                         Expected End Date: 07/03/20                        - call to cancel if needed - keep a calendar with appointment dates Reschedule missed appointments     Why is this important?   Part of staying healthy is seeing the doctor for follow-up care.  If you forget your appointments, there are some things you can do to stay on track.    Notes:  Reviewed upcoming appointments     . Manage My Medicine   On track    Follow Up Date 07/03/20 Timeframe:  Long-Range Goal Priority:  High Start Date:     03/14/20                       Expected End Date:  07/03/20                       - call for medicine refill 2 or 3 days before it runs out  - take medications as prescribed per most recent after visit summary   Why is this important?   These steps will help you keep on track with your medicines.     Notes:  Pharmacy referral due to cost of insulins    . Monitor and Manage My Blood Sugar   On track    Follow Up Date 07/03/20 Timeframe:  Long-Range Goal Priority:  High Start Date:  03/14/20                           Expected End Date:  07/03/20                       - check blood sugar at prescribed times - check blood sugar if I feel it is too high or too low - take the blood sugar meter to all doctor visits  Replace libre sensor to monitor blood sugars - with spouse assistance    Why is this important?   Checking your blood sugar at home helps to keep it from getting very high or very low.  Writing the results in a diary or log helps the doctor know how to care for you.  Your blood sugar log should have the time, date and the results.  Also, write down the amount of insulin or other medicine that you take.  Other information, like what you ate, exercise done and how you were feeling, will also be helpful.     Notes:     . Obtain Eye Exam   On track    Follow Up Date 06/01/20 Timeframe:  Short-Term Goal Priority:  Medium Start Date:   19/12/21                          Expected End Date:  07/03/20                       -  keep appointment with eye doctor  Rescheduled missed appointments    Why is this important?   Eye check-ups are important when you have diabetes.  Vision loss can be prevented.    Notes:  Has appointment on calendar for 06/28/20    . Perform Foot Care   On track    Follow Up Date 07/03/20 Timeframe:  Short-Term Goal Priority:  Medium Start Date:  03/14/20                           Expected End Date:  06/23/20                       - check feet daily for cuts, sores or redness - wash and dry feet carefully every day - wear comfortable, cotton socks - wear comfortable, well-fitting shoes    Why is this important?   Good foot care is very important when you have diabetes.  There are many things you can do to keep your feet healthy and catch a  problem early.    Notes:  Encourage spouse to assist with observance of feet and with care     . Set My Target A1C   On track    Follow Up Date 07/03/20 Timeframe:  Long-Range Goal Priority:  Medium Start Date:    03/14/20                         Expected End Date:   07/03/20                      - set target A1C    Why is this important?   Your target A1C is decided together by you and your doctor.  It is based on several things like your age and other health issues.    Notes:  Call to PCP office regarding Ac1    . Track and Manage Activity and Exertion   On track    Follow Up Date 06/01/20 Timeframe:  Long-Range Goal Priority:  Medium Start Date:   03/14/20                          Expected End Date:07/03/20                         - pace activity allowing for rest  -participate with home health therapy    Why is this important?   Exercising is very important when managing your heart failure.  It will help your heart get stronger.    Notes:  Fall safety, use of walker, bedside commode     . Track and Manage Fluids and Swelling   On track    Follow Up Date 07/03/20 Timeframe:  Long-Range Goal Priority:  Medium Start Date:     03/14/20                        Expected End Date:   07/03/20                      - call office if I gain more than 2 pounds in one day or 5 pounds in one week - do ankle pumps when sitting - keep legs up while sitting - track weight in diary - watch for swelling  in feet, ankles and legs every day - weigh myself daily    Why is this important?   It is important to check your weight daily and watch how much salt and liquids you have.  It will help you to manage your heart failure.    Notes:     . Track and Manage Symptoms   On track    Follow Up Date 07/03/20 Timeframe:  Long-Range Goal Priority:  Medium Start Date:   03/14/20                          Expected End Date:07/03/20                         - bring diary to all  appointments - follow rescue plan if symptoms flare-up - know when to call the doctor - dress right for the weather, hot or cold    Why is this important?   You will be able to handle your symptoms better if you keep track of them.  Making some simple changes to your lifestyle will help.  Eating healthy is one thing you can do to take good care of yourself.    Notes:        Plan:  Follow-up:  Patient agrees to Care Plan and Follow-up. Will schedule follow up call in the next 2 weeks. Will  send patient forms for signature on [place] care program for paramedicine.with Programmer, multimedia for release of information, communicated with [facility] CMA.  Joylene Draft, RN, BSN  [place] Management Coordinator  [phone]- Mobile [phone]- Toll Free Main Office

## 2020-06-20 ENCOUNTER — Ambulatory Visit: Payer: Self-pay | Admitting: *Deleted

## 2020-06-20 DIAGNOSIS — D631 Anemia in chronic kidney disease: Secondary | ICD-10-CM | POA: Diagnosis not present

## 2020-06-20 DIAGNOSIS — I110 Hypertensive heart disease with heart failure: Secondary | ICD-10-CM | POA: Diagnosis not present

## 2020-06-20 DIAGNOSIS — E1122 Type 2 diabetes mellitus with diabetic chronic kidney disease: Secondary | ICD-10-CM | POA: Diagnosis not present

## 2020-06-20 DIAGNOSIS — E1165 Type 2 diabetes mellitus with hyperglycemia: Secondary | ICD-10-CM | POA: Diagnosis not present

## 2020-06-20 DIAGNOSIS — I5023 Acute on chronic systolic (congestive) heart failure: Secondary | ICD-10-CM | POA: Diagnosis not present

## 2020-06-20 DIAGNOSIS — N1832 Chronic kidney disease, stage 3b: Secondary | ICD-10-CM | POA: Diagnosis not present

## 2020-06-21 ENCOUNTER — Ambulatory Visit (HOSPITAL_COMMUNITY): Payer: Medicare Other

## 2020-06-21 ENCOUNTER — Encounter (HOSPITAL_COMMUNITY): Payer: Self-pay

## 2020-06-22 ENCOUNTER — Telehealth: Payer: Self-pay | Admitting: Family Medicine

## 2020-06-22 ENCOUNTER — Encounter: Payer: Self-pay | Admitting: *Deleted

## 2020-06-22 NOTE — Telephone Encounter (Signed)
Error

## 2020-06-28 ENCOUNTER — Other Ambulatory Visit: Payer: Self-pay | Admitting: Internal Medicine

## 2020-06-28 DIAGNOSIS — I5023 Acute on chronic systolic (congestive) heart failure: Secondary | ICD-10-CM

## 2020-06-29 ENCOUNTER — Ambulatory Visit: Payer: Self-pay | Admitting: *Deleted

## 2020-06-29 ENCOUNTER — Ambulatory Visit (INDEPENDENT_AMBULATORY_CARE_PROVIDER_SITE_OTHER): Payer: Medicare Other | Admitting: *Deleted

## 2020-06-29 DIAGNOSIS — D631 Anemia in chronic kidney disease: Secondary | ICD-10-CM | POA: Diagnosis not present

## 2020-06-29 DIAGNOSIS — N1832 Chronic kidney disease, stage 3b: Secondary | ICD-10-CM | POA: Diagnosis not present

## 2020-06-29 DIAGNOSIS — I5023 Acute on chronic systolic (congestive) heart failure: Secondary | ICD-10-CM | POA: Diagnosis not present

## 2020-06-29 DIAGNOSIS — E1122 Type 2 diabetes mellitus with diabetic chronic kidney disease: Secondary | ICD-10-CM | POA: Diagnosis not present

## 2020-06-29 DIAGNOSIS — I110 Hypertensive heart disease with heart failure: Secondary | ICD-10-CM | POA: Diagnosis not present

## 2020-06-29 DIAGNOSIS — Z5181 Encounter for therapeutic drug level monitoring: Secondary | ICD-10-CM

## 2020-06-29 DIAGNOSIS — I4891 Unspecified atrial fibrillation: Secondary | ICD-10-CM | POA: Diagnosis not present

## 2020-06-29 DIAGNOSIS — E1165 Type 2 diabetes mellitus with hyperglycemia: Secondary | ICD-10-CM | POA: Diagnosis not present

## 2020-06-29 LAB — POCT INR: INR: 2.3 (ref 2.0–3.0)

## 2020-06-29 NOTE — Patient Instructions (Signed)
Continue warfarin 4mg  daily. Recheck in 2 wks Order given to [facility]

## 2020-06-30 ENCOUNTER — Other Ambulatory Visit: Payer: Self-pay | Admitting: *Deleted

## 2020-06-30 NOTE — Patient Outreach (Signed)
[facility]) Care Management  [place]  06/30/2020   ANGELIN CUTRONE [phone] [id number]   Telephone Assessment Complex caremanagement   Inpatient Admission at [facility] 10/18-10/20/21 ( left AMA) Dx: Hyperglycemia Hyperosmolar non-ketotic state. Inpatient admission [facility] 10/21-10/25/21 Acute Heart Failure ED visit [facility] 12/17, UTI ,Pneumonia , Fall at home   Patient is a 70 year old female with PMHX: Combined Systolic and Diastolic Heart Failure, Chronic hypoxic respiratory failure, chronic oxygen therapyat 3 litersOSA with Bipap, Chronic pain syndrome, hypertension,Chronic Atrial Fib,Diabetes( A1c 13.9% on 6/2/21per [facility])Depression with anxiety, partially blind both eyes, hard of hearing  Subjective:  Unsuccessful outreach call to patient, no answer voicemail box no set up , unable to leave a HIPAA compliant voicemail message.   Care Coordination  Received into [facility] office, patient return signed form for release of information to [facility] integrated health care program. Forms have  been faxed to [facility] program as patient has previously agreed.     Plan If no return call from patient will schedule return call to patient in the next 2 weeks.    Joylene Draft, RN, BSN  [place] Management Coordinator  [phone]- Mobile [phone]- Toll Free Main Office

## 2020-07-01 DIAGNOSIS — I5023 Acute on chronic systolic (congestive) heart failure: Secondary | ICD-10-CM | POA: Diagnosis not present

## 2020-07-01 DIAGNOSIS — I110 Hypertensive heart disease with heart failure: Secondary | ICD-10-CM | POA: Diagnosis not present

## 2020-07-01 DIAGNOSIS — D631 Anemia in chronic kidney disease: Secondary | ICD-10-CM | POA: Diagnosis not present

## 2020-07-01 DIAGNOSIS — J9611 Chronic respiratory failure with hypoxia: Secondary | ICD-10-CM | POA: Diagnosis not present

## 2020-07-01 DIAGNOSIS — I482 Chronic atrial fibrillation, unspecified: Secondary | ICD-10-CM | POA: Diagnosis not present

## 2020-07-01 DIAGNOSIS — E1122 Type 2 diabetes mellitus with diabetic chronic kidney disease: Secondary | ICD-10-CM | POA: Diagnosis not present

## 2020-07-01 DIAGNOSIS — N1832 Chronic kidney disease, stage 3b: Secondary | ICD-10-CM | POA: Diagnosis not present

## 2020-07-01 DIAGNOSIS — M1000 Idiopathic gout, unspecified site: Secondary | ICD-10-CM | POA: Diagnosis not present

## 2020-07-01 DIAGNOSIS — J449 Chronic obstructive pulmonary disease, unspecified: Secondary | ICD-10-CM | POA: Diagnosis not present

## 2020-07-01 DIAGNOSIS — E1165 Type 2 diabetes mellitus with hyperglycemia: Secondary | ICD-10-CM | POA: Diagnosis not present

## 2020-07-06 DIAGNOSIS — E1165 Type 2 diabetes mellitus with hyperglycemia: Secondary | ICD-10-CM | POA: Diagnosis not present

## 2020-07-06 DIAGNOSIS — I5023 Acute on chronic systolic (congestive) heart failure: Secondary | ICD-10-CM | POA: Diagnosis not present

## 2020-07-06 DIAGNOSIS — I110 Hypertensive heart disease with heart failure: Secondary | ICD-10-CM | POA: Diagnosis not present

## 2020-07-06 DIAGNOSIS — D631 Anemia in chronic kidney disease: Secondary | ICD-10-CM | POA: Diagnosis not present

## 2020-07-06 DIAGNOSIS — E1122 Type 2 diabetes mellitus with diabetic chronic kidney disease: Secondary | ICD-10-CM | POA: Diagnosis not present

## 2020-07-06 DIAGNOSIS — N1832 Chronic kidney disease, stage 3b: Secondary | ICD-10-CM | POA: Diagnosis not present

## 2020-07-08 DIAGNOSIS — J302 Other seasonal allergic rhinitis: Secondary | ICD-10-CM | POA: Diagnosis not present

## 2020-07-08 DIAGNOSIS — J9611 Chronic respiratory failure with hypoxia: Secondary | ICD-10-CM | POA: Diagnosis not present

## 2020-07-08 DIAGNOSIS — M797 Fibromyalgia: Secondary | ICD-10-CM | POA: Diagnosis not present

## 2020-07-08 DIAGNOSIS — E1165 Type 2 diabetes mellitus with hyperglycemia: Secondary | ICD-10-CM | POA: Diagnosis not present

## 2020-07-08 DIAGNOSIS — D631 Anemia in chronic kidney disease: Secondary | ICD-10-CM | POA: Diagnosis not present

## 2020-07-08 DIAGNOSIS — F411 Generalized anxiety disorder: Secondary | ICD-10-CM | POA: Diagnosis not present

## 2020-07-08 DIAGNOSIS — E782 Mixed hyperlipidemia: Secondary | ICD-10-CM | POA: Diagnosis not present

## 2020-07-08 DIAGNOSIS — E559 Vitamin D deficiency, unspecified: Secondary | ICD-10-CM | POA: Diagnosis not present

## 2020-07-08 DIAGNOSIS — I482 Chronic atrial fibrillation, unspecified: Secondary | ICD-10-CM | POA: Diagnosis not present

## 2020-07-08 DIAGNOSIS — G2581 Restless legs syndrome: Secondary | ICD-10-CM | POA: Diagnosis not present

## 2020-07-08 DIAGNOSIS — F319 Bipolar disorder, unspecified: Secondary | ICD-10-CM | POA: Diagnosis not present

## 2020-07-08 DIAGNOSIS — J449 Chronic obstructive pulmonary disease, unspecified: Secondary | ICD-10-CM | POA: Diagnosis not present

## 2020-07-08 DIAGNOSIS — G894 Chronic pain syndrome: Secondary | ICD-10-CM | POA: Diagnosis not present

## 2020-07-08 DIAGNOSIS — E6609 Other obesity due to excess calories: Secondary | ICD-10-CM | POA: Diagnosis not present

## 2020-07-08 DIAGNOSIS — M1000 Idiopathic gout, unspecified site: Secondary | ICD-10-CM | POA: Diagnosis not present

## 2020-07-08 DIAGNOSIS — E1122 Type 2 diabetes mellitus with diabetic chronic kidney disease: Secondary | ICD-10-CM | POA: Diagnosis not present

## 2020-07-08 DIAGNOSIS — I110 Hypertensive heart disease with heart failure: Secondary | ICD-10-CM | POA: Diagnosis not present

## 2020-07-08 DIAGNOSIS — M545 Low back pain, unspecified: Secondary | ICD-10-CM | POA: Diagnosis not present

## 2020-07-08 DIAGNOSIS — K219 Gastro-esophageal reflux disease without esophagitis: Secondary | ICD-10-CM | POA: Diagnosis not present

## 2020-07-08 DIAGNOSIS — G4733 Obstructive sleep apnea (adult) (pediatric): Secondary | ICD-10-CM | POA: Diagnosis not present

## 2020-07-08 DIAGNOSIS — E876 Hypokalemia: Secondary | ICD-10-CM | POA: Diagnosis not present

## 2020-07-08 DIAGNOSIS — N1832 Chronic kidney disease, stage 3b: Secondary | ICD-10-CM | POA: Diagnosis not present

## 2020-07-08 DIAGNOSIS — I5023 Acute on chronic systolic (congestive) heart failure: Secondary | ICD-10-CM | POA: Diagnosis not present

## 2020-07-08 DIAGNOSIS — G250 Essential tremor: Secondary | ICD-10-CM | POA: Diagnosis not present

## 2020-07-09 NOTE — Progress Notes (Deleted)
Cardiology Office Note  Date: 07/09/2020   ID: Denise Freeman, DOB 23-Feb-1951, MRN [id number]  PCP:  [name], NP  Cardiologist:  Carlyle Dolly, MD Electrophysiologist:  None   Chief Complaint: Cardiac follow-up  History of Present Illness: Denise Freeman is a 70 y.o. female with a history of chronic diastolic heart failure, atrial fibrillation, COPD, low blood pressure, CKD, OSA, DM 2, nonischemic cardiomyopathy, hyperlipidemia.  Recently seen by Dr. Harl Bowie on 06/07/2020.  Secondary to chronic diastolic CHF she appeared euvolemic by exam.  Very high BNP from recent ER visit.  Plan was to repeat BMP.  On high-dose diuretics.  Repeat renal labs and may need to stop metolazone.  Recent palpitations.  Toprol was increased to 37.5 mg p.o. twice daily. Recent labs 06/08/2020:: BUN 111, creatinine 1.76, hemoglobin 11.4, hematocrit 34.1 potassium 3.2, calcium 8.2, magnesium 1.5, BNP 177.  Patient was started on potassium 40 mEq daily on 06/15/2020 and repeat basic metabolic panel was ordered for [redacted] weeks along with magnesium.  Past Medical History:  Diagnosis Date  . Allergy   . Anemia   . Anxiety   . Asthma   . Atrial fibrillation ([place])   . Bipolar 1 disorder ([place])   . 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 Cholelithiasis    a. 09/2016 s/p Lap Chole.  . Chronic bronchitis ([place])   . Chronic combined systolic and diastolic congestive heart failure ([place])    a. 09/2016 Echo: EF 30-35%.  . Colon polyps   . COPD (chronic obstructive pulmonary disease) ([place])   . Depression   . Family history of adverse reaction to anesthesia    Uncle was positive for malignant hyperthermia; patient had testing done and was negative.  . Fibromyalgia   . GERD (gastroesophageal reflux disease)   . Gout   . High cholesterol   . History of blood transfusion 06/2015   "bleeding from my rectum"  . History of hiatal hernia   . HOH (hard of hearing)   . Hx of colonic polyps 03/21/2016   3 small  adenomas no recall - co-morbidities  . Neuropathy    Disc Back   . NICM (nonischemic cardiomyopathy) ([place])    a. Previously worked up in [place], MD-->low EF with subsequent recovery.  Multiple caths (last ~ 2014 per pt report)--reportedly nl cors;  b. 09/2016 Echo: EF 30-35%, antsept/apical HK, mild MR, mildly dil LA, mod dil RA;  c. 09/2016 Lexi MV: EF 26%, glob HK, sept DK, med size, mod intensity fixed septal defect - BBB/PVC related artifact, no ischemia.  . On home oxygen therapy    "3L; 24/7" (03/14/2016)  . OSA treated with BiPAP    uses biPAP, 10 (03/14/2016)  . Osteoarthritis   . Oxygen deficiency   . Pneumonia   . Type II diabetes mellitus ([place])     Past Surgical History:  Procedure Laterality Date  . APPENDECTOMY     "they busted"  . bladder stimulator     pt states, "it cannot be turned off; it's in my right hip; dead battery so it's not working anymore". (03/14/2016)  . BLADDER SUSPENSION     2003, 2006 and 2010  . CATARACT EXTRACTION W/PHACO Right 11/29/2014   Procedure: CATARACT EXTRACTION PHACO AND INTRAOCULAR LENS PLACEMENT (IOC);  Surgeon: Rutherford Guys, MD;  Location: [facility];  Service: Ophthalmology;  Laterality: Right;  CDE:3.81  . CATARACT EXTRACTION W/PHACO Left 12/13/2014   Procedure: CATARACT EXTRACTION PHACO AND  INTRAOCULAR LENS PLACEMENT (IOC);  Surgeon: Rutherford Guys, MD;  Location: [facility];  Service: Ophthalmology;  Laterality: Left;  CDE:6.59  . CERVICAL DISC SURGERY N/A 2009   4, 6, and 7 cervical disc replaced  . CHOLECYSTECTOMY N/A 09/13/2016   Procedure: LAPAROSCOPIC CHOLECYSTECTOMY;  Surgeon: Rolm Bookbinder, MD;  Location: [place];  Service: General;  Laterality: N/A;  . COLONOSCOPY WITH PROPOFOL N/A 03/15/2016   Procedure: COLONOSCOPY WITH PROPOFOL;  Surgeon: Gatha Mayer, MD;  Location: [place] [facility];  Service: Endoscopy;  Laterality: N/A;  . ESOPHAGOGASTRODUODENOSCOPY (EGD) WITH PROPOFOL N/A 03/15/2016   Procedure: ESOPHAGOGASTRODUODENOSCOPY (EGD)  WITH PROPOFOL;  Surgeon: Gatha Mayer, MD;  Location: [place];  Service: Endoscopy;  Laterality: N/A;  . HEEL SPUR SURGERY Bilateral   . HERNIA REPAIR    . I & D EXTREMITY Right 06/13/2015   Procedure: MINOR IRRIGATION AND DEBRIDEMENT EXTREMITY REMOVAL OF NAIL;  Surgeon: Daryll Brod, MD;  Location: [place] [facility];  Service: Orthopedics;  Laterality: Right;  . TUBAL LIGATION    . UMBILICAL HERNIA REPAIR     w/mesh    Current Outpatient Medications  Medication Sig Dispense Refill  . albuterol (PROVENTIL) (2.5 MG/3ML) 0.083% nebulizer solution Take 3 mLs (2.5 mg total) by nebulization every 6 (six) hours as needed for wheezing or shortness of breath. 75 mL 5  . allopurinol (ZYLOPRIM) 100 MG tablet TAKE 1 TABLET BY MOUTH ONCE DAILY. (Patient taking differently: Take 100 mg by mouth daily.) 30 tablet 0  . budesonide (PULMICORT) 0.5 MG/2ML nebulizer solution Take 2 mLs (0.5 mg total) by nebulization 2 (two) times daily. 120 mL 11  . cetirizine (ZYRTEC) 10 MG tablet Take 10 mg by mouth daily.    . cholecalciferol (VITAMIN D) 400 units TABS tablet Take 1 tablet (400 Units total) by mouth 2 (two) times daily. 180 each 3  . clobetasol cream (TEMOVATE) 3.61 % Apply 1 application topically daily.     . colchicine 0.6 MG tablet Take 0.6 mg by mouth daily as needed. gout    . FEROSUL 325 (65 Fe) MG tablet Take 325 mg by mouth daily.     . fluticasone (FLONASE) 50 MCG/ACT nasal spray Place 1 spray into both nostrils 2 (two) times daily.    . furosemide (LASIX) 40 MG tablet TAKE 3 TABLETS BY MOUTH TWICE DAILY 168 tablet 11  . Insulin Glargine (BASAGLAR KWIKPEN) 100 UNIT/ML Inject 20 Units into the skin 2 (two) times daily.    . insulin lispro (HUMALOG) 100 UNIT/ML KiwkPen Inject 0.02 mLs (2 Units total) into the skin 3 (three) times daily before meals. 15 mL 11  . ipratropium (ATROVENT) 0.02 % nebulizer solution Take 2.5 mLs (0.5 mg total) by nebulization every 6 (six) hours as needed for  wheezing or shortness of breath. 62.5 mL 5  . metolazone (ZAROXOLYN) 2.5 MG tablet Take 1 tablet (2.5 mg total) by mouth daily. 90 tablet 3  . metoprolol succinate (TOPROL XL) 25 MG 24 hr tablet Take 1.5 tablets (37.5 mg total) by mouth daily. 270 tablet 3  . mirtazapine (REMERON) 15 MG tablet Take 1 tablet (15 mg total) by mouth at bedtime. 90 tablet 0  . modafinil (PROVIGIL) 200 MG tablet Take 1 tablet (200 mg total) by mouth daily. 30 tablet 5  . montelukast (SINGULAIR) 10 MG tablet Take 1 tablet (10 mg total) by mouth at bedtime. 30 tablet 5  . nitroGLYCERIN (NITROSTAT) 0.4 MG SL tablet Place 0.4 mg under the tongue every 5 (  five) minutes as needed for chest pain.     [place] oxyCODONE-acetaminophen (PERCOCET/ROXICET) 5-325 MG tablet Take 1 tablet by mouth 2 (two) times daily as needed for severe pain. 60 tablet 0  . pantoprazole (PROTONIX) 20 MG tablet TAKE (1) TABLET BY MOUTH TWICE A DAY BEFORE MEALS. (BREAKFAST AND SUPPER) (Patient taking differently: Take 20 mg by mouth 2 (two) times daily before a meal.) 56 tablet 0  . primidone (MYSOLINE) 50 MG tablet TAKE ONE TABLET BY MOUTH AT BEDTIME. (Patient taking differently: Take 50 mg by mouth at bedtime.) 30 tablet 5  . Probiotic Product (PROBIOTIC PO) Take 1 tablet by mouth at bedtime.    . TRUE METRIX BLOOD GLUCOSE TEST test strip USE TO TEST BLOOD SUGAR AS DIRECTED. 50 each 2  . Vitamin A 2400 MCG (8000 UT) CAPS Take 8,000 Units by mouth every morning.     . Vitamin D, Ergocalciferol, (DRISDOL) 1.25 MG (50000 UNIT) CAPS capsule Take 1 capsule (50,000 Units total) by mouth every 7 (seven) days. 12 capsule 0  . warfarin (COUMADIN) 4 MG tablet TAKE 1 TABLET BY MOUTH ONCE A DAY OR AS DIRECTED BY THE COUMADIN CLINIC 30 tablet 11   No current facility-administered medications for this visit.   Allergies:  Oseltamivir, Xarelto [rivaroxaban], Ancef [cefazolin], Levaquin [levofloxacin in d5w], Lyrica [pregabalin], Tamiflu [oseltamivir phosphate], Zoloft  [sertraline hcl], Torsemide, Augmentin [amoxicillin-pot clavulanate], Ciprofloxacin, Haldol [haloperidol], Nsaids, Penicillins, and Topamax [topiramate]   Social History: The patient  reports that she quit smoking about 31 years ago. Her smoking use included cigarettes. She has a 28.00 pack-year smoking history. She has never used smokeless tobacco. She reports that she does not drink alcohol and does not use drugs.   Family History: The patient's family history includes Bipolar disorder in her brother; Breast cancer in her mother; COPD in her mother and sister; Cancer in her maternal grandfather and maternal grandmother; Diabetes in her mother and sister; Heart disease in her father, maternal grandmother, mother, and sister; Hyperlipidemia in her father.   ROS:  Please see the history of present illness. Otherwise, complete review of systems is positive for {NONE DEFAULTED:18576::"none"}.  All other systems are reviewed and negative.   Physical Exam: VS:  There were no vitals taken for this visit., BMI There is no height or weight on file to calculate BMI.  Wt Readings from Last 3 Encounters:  06/07/20 168 lb 9.6 oz (76.5 kg)  03/24/20 168 lb 1.6 oz (76.2 kg)  03/20/20 170 lb (77.1 kg)    General: Patient appears comfortable at rest. HEENT: Conjunctiva and lids normal, oropharynx clear with moist mucosa. Neck: Supple, no elevated JVP or carotid bruits, no thyromegaly. Lungs: Clear to auscultation, nonlabored breathing at rest. Cardiac: Regular rate and rhythm, no S3 or significant systolic murmur, no pericardial rub. Abdomen: Soft, nontender, no hepatomegaly, bowel sounds present, no guarding or rebound. Extremities: No pitting edema, distal pulses 2+. Skin: Warm and dry. Musculoskeletal: No kyphosis. Neuropsychiatric: Alert and oriented x3, affect grossly appropriate.  ECG:  {EKG/Telemetry Strips Reviewed:[id number]}  Recent Labwork: 06/08/2020: Brain Natriuretic Peptide 177; BUN 111;  Creat 1.76; Hemoglobin 11.4; Magnesium 1.5; Platelets 244; Potassium 3.2; Sodium 139     Component Value Date/Time   CHOL 163 07/29/2017 1115   TRIG 183 (H) 07/29/2017 1115   HDL 40 07/29/2017 1115   CHOLHDL 4.1 07/29/2017 1115   CHOLHDL 6.2 09/10/2016 1147   VLDL 30 09/10/2016 1147   LDLCALC 86 07/29/2017 1115   LDLDIRECT 153.0 04/19/2016  3149    Other Studies Reviewed Today:  Echocardiogram 04/09/2019 1. Left ventricular ejection fraction, by visual estimation, is 50 to 55%. The left ventricle has low normal function. There is mildly increased left ventricular hypertrophy. 2. Elevated left atrial pressure. 3. Left ventricular diastolic parameters are consistent with Grade II diastolic dysfunction (pseudonormalization). 4. Global right ventricle has mildly reduced systolic function.The right ventricular size is moderately enlarged. No increase in right ventricular wall thickness. 5. Left atrial size was moderately dilated. 6. Right atrial size was moderately dilated. 7. The mitral valve is normal in structure. No evidence of mitral valve regurgitation. No evidence of mitral stenosis. 8. The tricuspid valve is normal in structure. Tricuspid valve regurgitation is trivial. 9. The aortic valve has an indeterminant number of cusps. Aortic valve regurgitation is not visualized. No evidence of aortic valve sclerosis or stenosis. 10. The pulmonic valve was not well visualized. Pulmonic valve regurgitation is mild. 11. Mildly elevated pulmonary artery systolic pressure. 12. The inferior vena cava is dilated in size with >50% respiratory variability, suggesting right atrial pressure of 8 mmHg.   Assessment and Plan:  1. Chronic diastolic heart failure ([place])   2. Permanent atrial fibrillation ([place])   3. Acute kidney injury superimposed on CKD ([place])      Medication Adjustments/Labs and Tests Ordered: Current medicines are reviewed at length with the patient today.  Concerns  regarding medicines are outlined above.   Disposition: Follow-up with ***  Signed, Levell July, NP 07/09/2020 6:26 PM    [place] [facility] [address], [place], [place] [address] Phone: [phone]; Fax: [phone]

## 2020-07-10 ENCOUNTER — Ambulatory Visit: Payer: Medicare Other | Admitting: Family Medicine

## 2020-07-10 DIAGNOSIS — I4821 Permanent atrial fibrillation: Secondary | ICD-10-CM

## 2020-07-10 DIAGNOSIS — I5032 Chronic diastolic (congestive) heart failure: Secondary | ICD-10-CM

## 2020-07-10 DIAGNOSIS — N179 Acute kidney failure, unspecified: Secondary | ICD-10-CM

## 2020-07-11 ENCOUNTER — Other Ambulatory Visit: Payer: Self-pay | Admitting: Internal Medicine

## 2020-07-11 DIAGNOSIS — I5023 Acute on chronic systolic (congestive) heart failure: Secondary | ICD-10-CM

## 2020-07-13 ENCOUNTER — Other Ambulatory Visit: Payer: Self-pay | Admitting: *Deleted

## 2020-07-13 ENCOUNTER — Ambulatory Visit (INDEPENDENT_AMBULATORY_CARE_PROVIDER_SITE_OTHER): Payer: Medicare Other | Admitting: *Deleted

## 2020-07-13 DIAGNOSIS — I5023 Acute on chronic systolic (congestive) heart failure: Secondary | ICD-10-CM | POA: Diagnosis not present

## 2020-07-13 DIAGNOSIS — Z5181 Encounter for therapeutic drug level monitoring: Secondary | ICD-10-CM | POA: Diagnosis not present

## 2020-07-13 DIAGNOSIS — N1832 Chronic kidney disease, stage 3b: Secondary | ICD-10-CM | POA: Diagnosis not present

## 2020-07-13 DIAGNOSIS — I4891 Unspecified atrial fibrillation: Secondary | ICD-10-CM | POA: Diagnosis not present

## 2020-07-13 DIAGNOSIS — I110 Hypertensive heart disease with heart failure: Secondary | ICD-10-CM | POA: Diagnosis not present

## 2020-07-13 DIAGNOSIS — D631 Anemia in chronic kidney disease: Secondary | ICD-10-CM | POA: Diagnosis not present

## 2020-07-13 DIAGNOSIS — E1165 Type 2 diabetes mellitus with hyperglycemia: Secondary | ICD-10-CM | POA: Diagnosis not present

## 2020-07-13 DIAGNOSIS — E1122 Type 2 diabetes mellitus with diabetic chronic kidney disease: Secondary | ICD-10-CM | POA: Diagnosis not present

## 2020-07-13 LAB — POCT INR: INR: 2.2 (ref 2.0–3.0)

## 2020-07-13 NOTE — Patient Instructions (Signed)
Continue warfarin 4mg  daily. Recheck in 2 wks Order given to [facility]

## 2020-07-13 NOTE — Patient Outreach (Signed)
[facility] Care Management  [name]  07/13/2020   Denise Freeman 1951-02-04 [id number]    Telephone Assessment Complex caremanagement   Inpatient Admission at [facility] 10/18-10/20/21 ( left AMA) Dx: Hyperglycemia Hyperosmolar non-ketotic state. Inpatient admission [facility] 10/21-10/25/21 Acute Heart Failure ED visit [facility] 12/17, UTI ,Pneumonia , Fall at home   Patient is a 71 year old female with PMHX: Combined Systolic and Diastolic Heart Failure, Chronic hypoxic respiratory failure, chronic oxygen therapyat 3 litersOSA with Bipap, Chronic pain syndrome, hypertension,Chronic Atrial Fib,Diabetes( A1c 13.9% on 6/2/21per [facility])Depression with anxiety, partially blind both eyes, hard of hearing  Subjective:  Outreach call to patient at her husband phone number, no answer unable to leave a message as voicemail not set up.  Placed call to patient home number no answer able to leave a HIPAA voice mail message for return call.    Care Coordination  Received incoming call from Rhae Lerner , paramedicine program [place]. She reports having home visit with patient in home on yesterday, she states patient was doing well,other than did not sleep well on last night . She reports patient lungs clear,no increase in swelling, 118/74 HR 78. medications reviewed she continues with using pill packaging. She reports patient blood sugar 160 on yesterday no reports hypoglycemia.  Jiles Garter states patient has had some phone difficulty she provided patient current temporary  contact number [phone], her husband phone to reach patient , she also updated [facility] Nurse patient contact number . Jiles Garter states patient is working on getting a new phone. Jiles Garter has home visit scheduled in the next week with patient and will follow up Care Coordinator.    Plan If no return call from patient will attempt return call in the next 2  weeks.    Joylene Draft, RN, BSN  [place] Management Coordinator  [phone]- Mobile [phone]- Toll Free Main Office

## 2020-07-18 ENCOUNTER — Other Ambulatory Visit: Payer: Self-pay | Admitting: *Deleted

## 2020-07-18 NOTE — Patient Outreach (Signed)
[facility]) Care Management  07/18/2020  Denise Freeman July 25, 1950 [id number]   Care Coordination    Subjective: Incoming call from [place] of [facility]. She called to update regarding her visit with patient .  She reports patient power was temporarily out expected to be out about one hour to complete work  the [employer] was doing. She states at visit she found patient connected to emergency oxygen tank that he husband had assisted her with, tank was on put the gauge was not set to 3 liter flow she states patient oxygen level initially registered at 46%, patient not in respiratory distress. She states patient fingers were cold, she warmed fingers rechecked oxygen saturation and levels up to 99% with oxygen at 3 liters. She educated spouse that has difficulty hearing about reconnecting to concentrator when power returns.   [name] requested information on how long [facility] plans to continue services I placed call to nurse [name] following patient they anticipate end of service to be around March 1.  [name] has plan for visit this week. Informed [facility] plans.  Spoke with [name] she states that with her paramedicine program she is unsure how long they will be able to follow patient for ongoing services.  She states during her visit patient was asking about a palliative care program as initially mentioned to patient by [facility] and patient is interested in having Palliative care service referral.    Plan Will place call to provider office to communicate patient request for  palliative care consult .  Spoke with [name] at office , she states patient has upcoming  appointment Denise Freeman provider,  March 2, she will add that request to patient chart to discuss. ,   Patient scheduled follow up call in the next week.   Joylene Draft, RN, BSN  [place] Management Coordinator  [phone]-  Mobile [phone]- Toll Free Main Office

## 2020-07-19 NOTE — Progress Notes (Deleted)
Cardiology Office Note  Date: 07/19/2020   ID: Denise Freeman, DOB 03-02-1951, MRN [id number]  PCP:  [name], NP  Cardiologist:  Carlyle Dolly, MD Electrophysiologist:  None   Chief Complaint: Cardiac follow-up  History of Present Illness: Denise Freeman is a 70 y.o. female with a history of chronic diastolic heart failure, atrial fibrillation, COPD, low blood pressure, CKD, OSA, DM 2, nonischemic cardiomyopathy, hyperlipidemia.  Recently seen by Dr. Harl Bowie on 06/07/2020.  Secondary to chronic diastolic CHF she appeared euvolemic by exam.  Very high BNP from recent ER visit.  Plan was to repeat BMP.  On high-dose diuretics.  Repeat renal labs and may need to stop metolazone.  Recent palpitations.  Toprol was increased to 37.5 mg p.o. twice daily. Recent labs 06/08/2020:: BUN 111, creatinine 1.76, hemoglobin 11.4, hematocrit 34.1 potassium 3.2, calcium 8.2, magnesium 1.5, BNP 177.  Patient was started on potassium 40 mEq daily on 06/15/2020 and repeat basic metabolic panel was ordered for [redacted] weeks along with magnesium.  Past Medical History:  Diagnosis Date  . Allergy   . Anemia   . Anxiety   . Asthma   . Atrial fibrillation ([place])   . Bipolar 1 disorder ([place])   . 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 Cholelithiasis    a. 09/2016 s/p Lap Chole.  . Chronic bronchitis ([place])   . Chronic combined systolic and diastolic congestive heart failure ([place])    a. 09/2016 Echo: EF 30-35%.  . Colon polyps   . COPD (chronic obstructive pulmonary disease) ([place])   . Depression   . Family history of adverse reaction to anesthesia    Uncle was positive for malignant hyperthermia; patient had testing done and was negative.  . Fibromyalgia   . GERD (gastroesophageal reflux disease)   . Gout   . High cholesterol   . History of blood transfusion 06/2015   "bleeding from my rectum"  . History of hiatal hernia   . HOH (hard of hearing)   . Hx of colonic polyps 03/21/2016   3  small adenomas no recall - co-morbidities  . Neuropathy    Disc Back   . NICM (nonischemic cardiomyopathy) ([place])    a. Previously worked up in [place], MD-->low EF with subsequent recovery.  Multiple caths (last ~ 2014 per pt report)--reportedly nl cors;  b. 09/2016 Echo: EF 30-35%, antsept/apical HK, mild MR, mildly dil LA, mod dil RA;  c. 09/2016 Lexi MV: EF 26%, glob HK, sept DK, med size, mod intensity fixed septal defect - BBB/PVC related artifact, no ischemia.  . On home oxygen therapy    "3L; 24/7" (03/14/2016)  . OSA treated with BiPAP    uses biPAP, 10 (03/14/2016)  . Osteoarthritis   . Oxygen deficiency   . Pneumonia   . Type II diabetes mellitus ([place])     Past Surgical History:  Procedure Laterality Date  . APPENDECTOMY     "they busted"  . bladder stimulator     pt states, "it cannot be turned off; it's in my right hip; dead battery so it's not working anymore". (03/14/2016)  . BLADDER SUSPENSION     2003, 2006 and 2010  . CATARACT EXTRACTION W/PHACO Right 11/29/2014   Procedure: CATARACT EXTRACTION PHACO AND INTRAOCULAR LENS PLACEMENT (IOC);  Surgeon: Rutherford Guys, MD;  Location: [facility];  Service: Ophthalmology;  Laterality: Right;  CDE:3.81  . CATARACT EXTRACTION W/PHACO Left 12/13/2014   Procedure: CATARACT EXTRACTION PHACO AND  INTRAOCULAR LENS PLACEMENT (IOC);  Surgeon: Rutherford Guys, MD;  Location: [facility];  Service: Ophthalmology;  Laterality: Left;  CDE:6.59  . CERVICAL DISC SURGERY N/A 2009   4, 6, and 7 cervical disc replaced  . CHOLECYSTECTOMY N/A 09/13/2016   Procedure: LAPAROSCOPIC CHOLECYSTECTOMY;  Surgeon: Rolm Bookbinder, MD;  Location: [facility];  Service: General;  Laterality: N/A;  . COLONOSCOPY WITH PROPOFOL N/A 03/15/2016   Procedure: COLONOSCOPY WITH PROPOFOL;  Surgeon: Gatha Mayer, MD;  Location: [facility];  Service: Endoscopy;  Laterality: N/A;  . ESOPHAGOGASTRODUODENOSCOPY (EGD) WITH PROPOFOL N/A 03/15/2016   Procedure: ESOPHAGOGASTRODUODENOSCOPY  (EGD) WITH PROPOFOL;  Surgeon: Gatha Mayer, MD;  Location: [facility];  Service: Endoscopy;  Laterality: N/A;  . HEEL SPUR SURGERY Bilateral   . HERNIA REPAIR    . I & D EXTREMITY Right 06/13/2015   Procedure: MINOR IRRIGATION AND DEBRIDEMENT EXTREMITY REMOVAL OF NAIL;  Surgeon: Daryll Brod, MD;  Location: [facility];  Service: Orthopedics;  Laterality: Right;  . TUBAL LIGATION    . UMBILICAL HERNIA REPAIR     w/mesh    Current Outpatient Medications  Medication Sig Dispense Refill  . albuterol (PROVENTIL) (2.5 MG/3ML) 0.083% nebulizer solution Take 3 mLs (2.5 mg total) by nebulization every 6 (six) hours as needed for wheezing or shortness of breath. 75 mL 5  . allopurinol (ZYLOPRIM) 100 MG tablet TAKE 1 TABLET BY MOUTH ONCE DAILY. (Patient taking differently: Take 100 mg by mouth daily.) 30 tablet 0  . budesonide (PULMICORT) 0.5 MG/2ML nebulizer solution Take 2 mLs (0.5 mg total) by nebulization 2 (two) times daily. 120 mL 11  . cetirizine (ZYRTEC) 10 MG tablet Take 10 mg by mouth daily.    . cholecalciferol (VITAMIN D) 400 units TABS tablet Take 1 tablet (400 Units total) by mouth 2 (two) times daily. 180 each 3  . clobetasol cream (TEMOVATE) 9.37 % Apply 1 application topically daily.     . colchicine 0.6 MG tablet Take 0.6 mg by mouth daily as needed. gout    . FEROSUL 325 (65 Fe) MG tablet Take 325 mg by mouth daily.     . fluticasone (FLONASE) 50 MCG/ACT nasal spray Place 1 spray into both nostrils 2 (two) times daily.    . furosemide (LASIX) 40 MG tablet TAKE 3 TABLETS BY MOUTH TWICE DAILY 168 tablet 11  . Insulin Glargine (BASAGLAR KWIKPEN) 100 UNIT/ML Inject 20 Units into the skin 2 (two) times daily.    . insulin lispro (HUMALOG) 100 UNIT/ML KiwkPen Inject 0.02 mLs (2 Units total) into the skin 3 (three) times daily before meals. 15 mL 11  . ipratropium (ATROVENT) 0.02 % nebulizer solution Take 2.5 mLs (0.5 mg total) by nebulization every 6 (six) hours as needed  for wheezing or shortness of breath. 62.5 mL 5  . metolazone (ZAROXOLYN) 2.5 MG tablet TAKE 1 TABLET BY MOUTH ONCE DAILY. 30 tablet 0  . metoprolol succinate (TOPROL XL) 25 MG 24 hr tablet Take 1.5 tablets (37.5 mg total) by mouth daily. 270 tablet 3  . mirtazapine (REMERON) 15 MG tablet Take 1 tablet (15 mg total) by mouth at bedtime. 90 tablet 0  . modafinil (PROVIGIL) 200 MG tablet Take 1 tablet (200 mg total) by mouth daily. 30 tablet 5  . montelukast (SINGULAIR) 10 MG tablet Take 1 tablet (10 mg total) by mouth at bedtime. 30 tablet 5  . nitroGLYCERIN (NITROSTAT) 0.4 MG SL tablet Place 0.4 mg under the tongue every 5 (five) minutes  as needed for chest pain.     Marland Kitchen oxyCODONE-acetaminophen (PERCOCET/ROXICET) 5-325 MG tablet Take 1 tablet by mouth 2 (two) times daily as needed for severe pain. 60 tablet 0  . pantoprazole (PROTONIX) 20 MG tablet TAKE (1) TABLET BY MOUTH TWICE A DAY BEFORE MEALS. (BREAKFAST AND SUPPER) (Patient taking differently: Take 20 mg by mouth 2 (two) times daily before a meal.) 56 tablet 0  . primidone (MYSOLINE) 50 MG tablet TAKE ONE TABLET BY MOUTH AT BEDTIME. (Patient taking differently: Take 50 mg by mouth at bedtime.) 30 tablet 5  . Probiotic Product (PROBIOTIC PO) Take 1 tablet by mouth at bedtime.    . TRUE METRIX BLOOD GLUCOSE TEST test strip USE TO TEST BLOOD SUGAR AS DIRECTED. 50 each 2  . Vitamin A 2400 MCG (8000 UT) CAPS Take 8,000 Units by mouth every morning.     . Vitamin D, Ergocalciferol, (DRISDOL) 1.25 MG (50000 UNIT) CAPS capsule Take 1 capsule (50,000 Units total) by mouth every 7 (seven) days. 12 capsule 0  . warfarin (COUMADIN) 4 MG tablet TAKE 1 TABLET BY MOUTH ONCE A DAY OR AS DIRECTED BY THE COUMADIN CLINIC 30 tablet 11   No current facility-administered medications for this visit.   Allergies:  Oseltamivir, Xarelto [rivaroxaban], Ancef [cefazolin], Levaquin [levofloxacin in d5w], Lyrica [pregabalin], Tamiflu [oseltamivir phosphate], Zoloft  [sertraline hcl], Torsemide, Augmentin [amoxicillin-pot clavulanate], Ciprofloxacin, Haldol [haloperidol], Nsaids, Penicillins, and Topamax [topiramate]   Social History: The patient  reports that she quit smoking about 31 years ago. Her smoking use included cigarettes. She has a 28.00 pack-year smoking history. She has never used smokeless tobacco. She reports that she does not drink alcohol and does not use drugs.   Family History: The patient's family history includes Bipolar disorder in her brother; Breast cancer in her mother; COPD in her mother and sister; Cancer in her maternal grandfather and maternal grandmother; Diabetes in her mother and sister; Heart disease in her father, maternal grandmother, mother, and sister; Hyperlipidemia in her father.   ROS:  Please see the history of present illness. Otherwise, complete review of systems is positive for {NONE DEFAULTED:18576::"none"}.  All other systems are reviewed and negative.   Physical Exam: VS:  There were no vitals taken for this visit., BMI There is no height or weight on file to calculate BMI.  Wt Readings from Last 3 Encounters:  06/07/20 168 lb 9.6 oz (76.5 kg)  03/24/20 168 lb 1.6 oz (76.2 kg)  03/20/20 170 lb (77.1 kg)    General: Patient appears comfortable at rest. HEENT: Conjunctiva and lids normal, oropharynx clear with moist mucosa. Neck: Supple, no elevated JVP or carotid bruits, no thyromegaly. Lungs: Clear to auscultation, nonlabored breathing at rest. Cardiac: Regular rate and rhythm, no S3 or significant systolic murmur, no pericardial rub. Abdomen: Soft, nontender, no hepatomegaly, bowel sounds present, no guarding or rebound. Extremities: No pitting edema, distal pulses 2+. Skin: Warm and dry. Musculoskeletal: No kyphosis. Neuropsychiatric: Alert and oriented x3, affect grossly appropriate.  ECG:  {EKG/Telemetry Strips [phone]}  Recent Labwork: 06/08/2020: Brain Natriuretic Peptide 177; BUN 111;  Creat 1.76; Hemoglobin 11.4; Magnesium 1.5; Platelets 244; Potassium 3.2; Sodium 139     Component Value Date/Time   CHOL 163 07/29/2017 1115   TRIG 183 (H) 07/29/2017 1115   HDL 40 07/29/2017 1115   CHOLHDL 4.1 07/29/2017 1115   CHOLHDL 6.2 09/10/2016 1147   VLDL 30 09/10/2016 1147   LDLCALC 86 07/29/2017 1115   LDLDIRECT 153.0 04/19/2016 0932  Other Studies Reviewed Today:  Echocardiogram 04/09/2019 1. Left ventricular ejection fraction, by visual estimation, is 50 to 55%. The left ventricle has low normal function. There is mildly increased left ventricular hypertrophy. 2. Elevated left atrial pressure. 3. Left ventricular diastolic parameters are consistent with Grade II diastolic dysfunction (pseudonormalization). 4. Global right ventricle has mildly reduced systolic function.The right ventricular size is moderately enlarged. No increase in right ventricular wall thickness. 5. Left atrial size was moderately dilated. 6. Right atrial size was moderately dilated. 7. The mitral valve is normal in structure. No evidence of mitral valve regurgitation. No evidence of mitral stenosis. 8. The tricuspid valve is normal in structure. Tricuspid valve regurgitation is trivial. 9. The aortic valve has an indeterminant number of cusps. Aortic valve regurgitation is not visualized. No evidence of aortic valve sclerosis or stenosis. 10. The pulmonic valve was not well visualized. Pulmonic valve regurgitation is mild. 11. Mildly elevated pulmonary artery systolic pressure. 12. The inferior vena cava is dilated in size with >50% respiratory variability, suggesting right atrial pressure of 8 mmHg.   Assessment and Plan:  1. Chronic combined systolic (congestive) and diastolic (congestive) heart failure ([place])   2. Permanent atrial fibrillation ([place])   3. Chronic obstructive pulmonary disease, unspecified COPD type ([place])   4. Stage 3 chronic kidney disease, unspecified whether stage 3a or  3b CKD ([place])      Medication Adjustments/Labs and Tests Ordered: Current medicines are reviewed at length with the patient today.  Concerns regarding medicines are outlined above.   Disposition: Follow-up with ***  Signed, Levell July, NP 07/19/2020 9:13 PM    [facility] [address], [place], [place] [address] Phone: [phone]; Fax: [phone]

## 2020-07-20 ENCOUNTER — Ambulatory Visit: Payer: Medicare Other | Admitting: Family Medicine

## 2020-07-20 DIAGNOSIS — N1832 Chronic kidney disease, stage 3b: Secondary | ICD-10-CM | POA: Diagnosis not present

## 2020-07-20 DIAGNOSIS — E1165 Type 2 diabetes mellitus with hyperglycemia: Secondary | ICD-10-CM | POA: Diagnosis not present

## 2020-07-20 DIAGNOSIS — I5023 Acute on chronic systolic (congestive) heart failure: Secondary | ICD-10-CM | POA: Diagnosis not present

## 2020-07-20 DIAGNOSIS — I4821 Permanent atrial fibrillation: Secondary | ICD-10-CM

## 2020-07-20 DIAGNOSIS — E1122 Type 2 diabetes mellitus with diabetic chronic kidney disease: Secondary | ICD-10-CM | POA: Diagnosis not present

## 2020-07-20 DIAGNOSIS — N183 Chronic kidney disease, stage 3 unspecified: Secondary | ICD-10-CM

## 2020-07-20 DIAGNOSIS — I5042 Chronic combined systolic (congestive) and diastolic (congestive) heart failure: Secondary | ICD-10-CM

## 2020-07-20 DIAGNOSIS — D631 Anemia in chronic kidney disease: Secondary | ICD-10-CM | POA: Diagnosis not present

## 2020-07-20 DIAGNOSIS — I110 Hypertensive heart disease with heart failure: Secondary | ICD-10-CM | POA: Diagnosis not present

## 2020-07-20 DIAGNOSIS — J449 Chronic obstructive pulmonary disease, unspecified: Secondary | ICD-10-CM

## 2020-07-21 ENCOUNTER — Encounter: Payer: Self-pay | Admitting: Cardiology

## 2020-07-26 ENCOUNTER — Other Ambulatory Visit: Payer: Self-pay | Admitting: *Deleted

## 2020-07-26 NOTE — Patient Outreach (Addendum)
[facility]) Care Management  [place]  07/26/2020   Denise Freeman 1950/12/12 [id number]   Telephone Assessment    Patient is a 70 year old female with PMHX: Combined Systolic and Diastolic Heart Failure, Chronic hypoxic respiratory failure, chronic oxygen therapyat 3 litersOSA with Bipap, Chronic pain syndrome, hypertension,Chronic Atrial Fib,Diabetes( A1c 13.9% on 6/2/21per [facility])Depression with anxiety, partially blind both eyes, hard of hearing   Subjective:  Unsuccessful outreach call to patient spouse telephone number as patient prior requested, as she is having telephone issues,  no answer unable to leave a message due to no voicemail setup. Placed call to patient contact number no answer, able to leave a voicemail message for return call.   Plan If no return call from patient will plan outreach call in the next month.  Will send patient unsuccessful outreach letter.    Joylene Draft, RN, BSN  [facility] Management Coordinator  [phone]- Mobile [phone]- Toll Free Main Office

## 2020-07-28 ENCOUNTER — Ambulatory Visit (INDEPENDENT_AMBULATORY_CARE_PROVIDER_SITE_OTHER): Payer: Medicare Other

## 2020-07-28 ENCOUNTER — Telehealth: Payer: Self-pay | Admitting: *Deleted

## 2020-07-28 DIAGNOSIS — I110 Hypertensive heart disease with heart failure: Secondary | ICD-10-CM | POA: Diagnosis not present

## 2020-07-28 DIAGNOSIS — N1832 Chronic kidney disease, stage 3b: Secondary | ICD-10-CM | POA: Diagnosis not present

## 2020-07-28 DIAGNOSIS — I4811 Longstanding persistent atrial fibrillation: Secondary | ICD-10-CM

## 2020-07-28 DIAGNOSIS — Z5181 Encounter for therapeutic drug level monitoring: Secondary | ICD-10-CM | POA: Diagnosis not present

## 2020-07-28 DIAGNOSIS — E1122 Type 2 diabetes mellitus with diabetic chronic kidney disease: Secondary | ICD-10-CM | POA: Diagnosis not present

## 2020-07-28 DIAGNOSIS — D631 Anemia in chronic kidney disease: Secondary | ICD-10-CM | POA: Diagnosis not present

## 2020-07-28 DIAGNOSIS — I5023 Acute on chronic systolic (congestive) heart failure: Secondary | ICD-10-CM | POA: Diagnosis not present

## 2020-07-28 DIAGNOSIS — E1165 Type 2 diabetes mellitus with hyperglycemia: Secondary | ICD-10-CM | POA: Diagnosis not present

## 2020-07-28 LAB — POCT INR: INR: 3.1 — AB (ref 2.0–3.0)

## 2020-07-28 NOTE — Telephone Encounter (Signed)
Denise Freeman with [facility] [phone]  INR  3.1 PT    36.9

## 2020-07-28 NOTE — Telephone Encounter (Signed)
Please see anti-coag note for today. 

## 2020-07-28 NOTE — Patient Instructions (Signed)
-   take 1/2 tablet tonight, then - Continue warfarin 4mg  daily. - Recheck in 2 wks Order given to [place], [place]

## 2020-07-31 DIAGNOSIS — I482 Chronic atrial fibrillation, unspecified: Secondary | ICD-10-CM | POA: Diagnosis not present

## 2020-07-31 DIAGNOSIS — M1000 Idiopathic gout, unspecified site: Secondary | ICD-10-CM | POA: Diagnosis not present

## 2020-07-31 DIAGNOSIS — N1832 Chronic kidney disease, stage 3b: Secondary | ICD-10-CM | POA: Diagnosis not present

## 2020-07-31 DIAGNOSIS — J449 Chronic obstructive pulmonary disease, unspecified: Secondary | ICD-10-CM | POA: Diagnosis not present

## 2020-07-31 DIAGNOSIS — I110 Hypertensive heart disease with heart failure: Secondary | ICD-10-CM | POA: Diagnosis not present

## 2020-07-31 DIAGNOSIS — E1122 Type 2 diabetes mellitus with diabetic chronic kidney disease: Secondary | ICD-10-CM | POA: Diagnosis not present

## 2020-07-31 DIAGNOSIS — E1165 Type 2 diabetes mellitus with hyperglycemia: Secondary | ICD-10-CM | POA: Diagnosis not present

## 2020-07-31 DIAGNOSIS — J9611 Chronic respiratory failure with hypoxia: Secondary | ICD-10-CM | POA: Diagnosis not present

## 2020-07-31 DIAGNOSIS — D631 Anemia in chronic kidney disease: Secondary | ICD-10-CM | POA: Diagnosis not present

## 2020-07-31 DIAGNOSIS — I5023 Acute on chronic systolic (congestive) heart failure: Secondary | ICD-10-CM | POA: Diagnosis not present

## 2020-08-07 NOTE — Progress Notes (Signed)
This is a telemedicine visit Patient location: Home Provider location: Office  Cardiology Office Note  Date: 08/08/2020   ID: Denise Freeman, DOB 11/20/1950, MRN [id number]  PCP:  [name], NP  Cardiologist:  Carlyle Dolly, MD Electrophysiologist:  None   Chief Complaint: Cardiac follow-up  History of Present Illness: Denise Freeman is a 70 y.o. female with a history of chronic diastolic heart failure, atrial fibrillation, COPD, low blood pressure, CKD, OSA, DM 2, nonischemic cardiomyopathy, hyperlipidemia.  Recently seen by Dr. Harl Bowie on 1/5/202 for chronic diastolic CHF. she appeared euvolemic by exam.  Very high BNP from recent ER visit.  Plan was to repeat BNP.  On high-dose diuretics.  Repeat renal labs and may need to stop metolazone.  Recent palpitations.  Toprol was increased to 37.5 mg p.o. twice daily. Recent labs 06/08/2020:: BUN 111, creatinine 1.76, hemoglobin 11.4, hematocrit 34.1 potassium 3.2, calcium 8.2, magnesium 1.5, BNP 177.  She was started on potassium 40 mEq daily on 06/15/2020.  She was to have a follow-up basic metabolic panel and magnesium in 2 weeks.  Marland Kitchen  Spoke to her by phone today.  She states the only issue she is having right at the moment is with her breathing.  She states she was prescribed medications for nebulizers treatments including Atrovent, albuterol, and Pulmicort.  She states it has not improved her breathing.  Blood pressure today is 130/75.  Heart rate is 93.  She states she does not have any major swelling or weight gain.  She states she has a follow-up at Dr. Juel Burrow office.  She was unaware she was to have a repeat basic metabolic panel and magnesium after she started the potassium.  She states she has a follow-up with Dr. Nevada Crane soon.  She states she can get the lab work drawn at his office if we send the order.    Past Medical History:  Diagnosis Date  . Allergy   . Anemia   . Anxiety   . Asthma   . Atrial fibrillation ([place])    . Bipolar 1 disorder ([place])   . 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 Cholelithiasis    a. 09/2016 s/p Lap Chole.  . Chronic bronchitis ([place])   . Chronic combined systolic and diastolic congestive heart failure ([place])    a. 09/2016 Echo: EF 30-35%.  . Colon polyps   . COPD (chronic obstructive pulmonary disease) ([place])   . Depression   . Family history of adverse reaction to anesthesia    Uncle was positive for malignant hyperthermia; patient had testing done and was negative.  . Fibromyalgia   . GERD (gastroesophageal reflux disease)   . Gout   . High cholesterol   . History of blood transfusion 06/2015   "bleeding from my rectum"  . History of hiatal hernia   . HOH (hard of hearing)   . Hx of colonic polyps 03/21/2016   3 small adenomas no recall - co-morbidities  . Neuropathy    Disc Back   . NICM (nonischemic cardiomyopathy) ([facility])    a. Previously worked up in [place], [place]-->low EF with subsequent recovery.  Multiple caths (last ~ 2014 per pt report)--reportedly nl cors;  b. 09/2016 Echo: EF 30-35%, antsept/apical HK, mild MR, mildly dil LA, mod dil RA;  c. 09/2016 Lexi MV: EF 26%, glob HK, sept DK, med size, mod intensity fixed septal defect - BBB/PVC related artifact, no ischemia.  . On  home oxygen therapy    "3L; 24/7" (03/14/2016)  . OSA treated with BiPAP    uses biPAP, 10 (03/14/2016)  . Osteoarthritis   . Oxygen deficiency   . Pneumonia   . Type II diabetes mellitus ([place])     Past Surgical History:  Procedure Laterality Date  . APPENDECTOMY     "they busted"  . bladder stimulator     pt states, "it cannot be turned off; it's in my right hip; dead battery so it's not working anymore". (03/14/2016)  . BLADDER SUSPENSION     2003, 2006 and 2010  . CATARACT EXTRACTION W/PHACO Right 11/29/2014   Procedure: CATARACT EXTRACTION PHACO AND INTRAOCULAR LENS PLACEMENT (IOC);  Surgeon: Rutherford Guys, MD;  Location: [facility];  Service: Ophthalmology;  Laterality:  Right;  CDE:3.81  . CATARACT EXTRACTION W/PHACO Left 12/13/2014   Procedure: CATARACT EXTRACTION PHACO AND INTRAOCULAR LENS PLACEMENT (IOC);  Surgeon: Rutherford Guys, MD;  Location: [facility];  Service: Ophthalmology;  Laterality: Left;  CDE:6.59  . CERVICAL DISC SURGERY N/A 2009   4, 6, and 7 cervical disc replaced  . CHOLECYSTECTOMY N/A 09/13/2016   Procedure: LAPAROSCOPIC CHOLECYSTECTOMY;  Surgeon: Rolm Bookbinder, MD;  Location: [facility];  Service: General;  Laterality: N/A;  . COLONOSCOPY WITH PROPOFOL N/A 03/15/2016   Procedure: COLONOSCOPY WITH PROPOFOL;  Surgeon: Gatha Mayer, MD;  Location: [facility];  Service: Endoscopy;  Laterality: N/A;  . ESOPHAGOGASTRODUODENOSCOPY (EGD) WITH PROPOFOL N/A 03/15/2016   Procedure: ESOPHAGOGASTRODUODENOSCOPY (EGD) WITH PROPOFOL;  Surgeon: Gatha Mayer, MD;  Location: [facility];  Service: Endoscopy;  Laterality: N/A;  . HEEL SPUR SURGERY Bilateral   . HERNIA REPAIR    . I & D EXTREMITY Right 06/13/2015   Procedure: MINOR IRRIGATION AND DEBRIDEMENT EXTREMITY REMOVAL OF NAIL;  Surgeon: Daryll Brod, MD;  Location: [facility];  Service: Orthopedics;  Laterality: Right;  . TUBAL LIGATION    . UMBILICAL HERNIA REPAIR     w/mesh    Current Outpatient Medications  Medication Sig Dispense Refill  . albuterol (PROVENTIL) (2.5 MG/3ML) 0.083% nebulizer solution Take 3 mLs (2.5 mg total) by nebulization every 6 (six) hours as needed for wheezing or shortness of breath. 75 mL 5  . allopurinol (ZYLOPRIM) 100 MG tablet TAKE 1 TABLET BY MOUTH ONCE DAILY. (Patient taking differently: Take 100 mg by mouth daily.) 30 tablet 0  . budesonide (PULMICORT) 0.5 MG/2ML nebulizer solution Take 2 mLs (0.5 mg total) by nebulization 2 (two) times daily. 120 mL 11  . cetirizine (ZYRTEC) 10 MG tablet Take 10 mg by mouth daily.    . cholecalciferol (VITAMIN D) 400 units TABS tablet Take 1 tablet (400 Units total) by mouth 2 (two) times daily. 180 each 3  .  clobetasol cream (TEMOVATE) 3.53 % Apply 1 application topically daily.     . colchicine 0.6 MG tablet Take 0.6 mg by mouth daily as needed. gout    . FEROSUL 325 (65 Fe) MG tablet Take 325 mg by mouth daily.     . fluticasone (FLONASE) 50 MCG/ACT nasal spray Place 1 spray into both nostrils 2 (two) times daily.    . furosemide (LASIX) 40 MG tablet TAKE 3 TABLETS BY MOUTH TWICE DAILY 168 tablet 11  . Insulin Glargine (BASAGLAR KWIKPEN) 100 UNIT/ML Inject 20 Units into the skin 2 (two) times daily.    . insulin lispro (HUMALOG) 100 UNIT/ML KiwkPen Inject 0.02 mLs (2 Units total) into the skin 3 (three) times daily before meals.  15 mL 11  . ipratropium (ATROVENT) 0.02 % nebulizer solution Take 2.5 mLs (0.5 mg total) by nebulization every 6 (six) hours as needed for wheezing or shortness of breath. 62.5 mL 5  . metolazone (ZAROXOLYN) 2.5 MG tablet TAKE 1 TABLET BY MOUTH ONCE DAILY. 30 tablet 0  . metoprolol succinate (TOPROL XL) 25 MG 24 hr tablet Take 1.5 tablets (37.5 mg total) by mouth daily. 270 tablet 3  . mirtazapine (REMERON) 15 MG tablet Take 1 tablet (15 mg total) by mouth at bedtime. 90 tablet 0  . modafinil (PROVIGIL) 200 MG tablet Take 1 tablet (200 mg total) by mouth daily. 30 tablet 5  . montelukast (SINGULAIR) 10 MG tablet Take 1 tablet (10 mg total) by mouth at bedtime. 30 tablet 5  . nitroGLYCERIN (NITROSTAT) 0.4 MG SL tablet Place 0.4 mg under the tongue every 5 (five) minutes as needed for chest pain.     Marland Kitchen oxyCODONE-acetaminophen (PERCOCET/ROXICET) 5-325 MG tablet Take 1 tablet by mouth 2 (two) times daily as needed for severe pain. 60 tablet 0  . pantoprazole (PROTONIX) 20 MG tablet TAKE (1) TABLET BY MOUTH TWICE A DAY BEFORE MEALS. (BREAKFAST AND SUPPER) (Patient taking differently: Take 20 mg by mouth 2 (two) times daily before a meal.) 56 tablet 0  . primidone (MYSOLINE) 50 MG tablet TAKE ONE TABLET BY MOUTH AT BEDTIME. (Patient taking differently: Take 50 mg by mouth at  bedtime.) 30 tablet 5  . Probiotic Product (PROBIOTIC PO) Take 1 tablet by mouth at bedtime.    . TRUE METRIX BLOOD GLUCOSE TEST test strip USE TO TEST BLOOD SUGAR AS DIRECTED. 50 each 2  . Vitamin A 2400 MCG (8000 UT) CAPS Take 8,000 Units by mouth every morning.     . Vitamin D, Ergocalciferol, (DRISDOL) 1.25 MG (50000 UNIT) CAPS capsule Take 1 capsule (50,000 Units total) by mouth every 7 (seven) days. 12 capsule 0  . warfarin (COUMADIN) 4 MG tablet TAKE 1 TABLET BY MOUTH ONCE A DAY OR AS DIRECTED BY THE COUMADIN CLINIC 30 tablet 11   No current facility-administered medications for this visit.   Allergies:  Oseltamivir, Xarelto [rivaroxaban], Ancef [cefazolin], Levaquin [levofloxacin in d5w], Lyrica [pregabalin], Tamiflu [oseltamivir phosphate], Zoloft [sertraline hcl], Torsemide, Augmentin [amoxicillin-pot clavulanate], Ciprofloxacin, Haldol [haloperidol], Nsaids, Penicillins, and Topamax [topiramate]   Social History: The patient  reports that she quit smoking about 31 years ago. Her smoking use included cigarettes. She has a 28.00 pack-year smoking history. She has never used smokeless tobacco. She reports that she does not drink alcohol and does not use drugs.   Family History: The patient's family history includes Bipolar disorder in her brother; Breast cancer in her mother; COPD in her mother and sister; Cancer in her maternal grandfather and maternal grandmother; Diabetes in her mother and sister; Heart disease in her father, maternal grandmother, mother, and sister; Hyperlipidemia in her father.   ROS:  Please see the history of present illness. Otherwise, complete review of systems is positive for none.  All other systems are reviewed and negative.   Physical Exam: VS:  BP 130/75   Pulse 93   Ht 4\' 11"  (1.499 m)   Wt 171 lb (77.6 kg)   BMI 34.54 kg/m , BMI Body mass index is 34.54 kg/m.  Wt Readings from Last 3 Encounters:  08/08/20 171 lb (77.6 kg)  06/07/20 168 lb 9.6 oz  (76.5 kg)  03/24/20 168 lb 1.6 oz (76.2 kg)    Patient had normal speech  pattern.  No wheezing, dyspnea, or cough noted during conversation.  ECG: No EKG today due to virtual visit.  Recent Labwork: 06/08/2020: Brain Natriuretic Peptide 177; BUN 111; Creat 1.76; Hemoglobin 11.4; Magnesium 1.5; Platelets 244; Potassium 3.2; Sodium 139     Component Value Date/Time   CHOL 163 07/29/2017 1115   TRIG 183 (H) 07/29/2017 1115   HDL 40 07/29/2017 1115   CHOLHDL 4.1 07/29/2017 1115   CHOLHDL 6.2 09/10/2016 1147   VLDL 30 09/10/2016 1147   LDLCALC 86 07/29/2017 1115   LDLDIRECT 153.0 04/19/2016 0932    Other Studies Reviewed Today:  Echocardiogram 04/09/2019 1. Left ventricular ejection fraction, by visual estimation, is 50 to 55%. The left ventricle has low normal function. There is mildly increased left ventricular hypertrophy. 2. Elevated left atrial pressure. 3. Left ventricular diastolic parameters are consistent with Grade II diastolic dysfunction (pseudonormalization). 4. Global right ventricle has mildly reduced systolic function.The right ventricular size is moderately enlarged. No increase in right ventricular wall thickness. 5. Left atrial size was moderately dilated. 6. Right atrial size was moderately dilated. 7. The mitral valve is normal in structure. No evidence of mitral valve regurgitation. No evidence of mitral stenosis. 8. The tricuspid valve is normal in structure. Tricuspid valve regurgitation is trivial. 9. The aortic valve has an indeterminant number of cusps. Aortic valve regurgitation is not visualized. No evidence of aortic valve sclerosis or stenosis. 10. The pulmonic valve was not well visualized. Pulmonic valve regurgitation is mild. 11. Mildly elevated pulmonary artery systolic pressure. 12. The inferior vena cava is dilated in size with >50% respiratory variability, suggesting right atrial pressure of 8 mmHg.   Assessment and Plan:  1. Chronic  diastolic heart failure ([place])   2. AKI (acute kidney injury) ([place])   3. Atrial fibrillation, unspecified type ([place])     1. Chronic diastolic heart failure (H[facility]) At last visit Dr. Harl Bowie increased metoprolol to 37.5 mg p.o. twice daily.  Follow-up lab work showed decreased potassium.  She was started on 40 of KCl per day.  She was to have a follow-up basic metabolic panel and magnesium 2 weeks after starting.  She states she was unaware she needed this done.  She states she will be seeing Dr. Nevada Crane in a couple of days and we can send the lab work order to his office.  Continue taking furosemide 120 mg p.o. twice daily.  Continue metolazone 2.5 mg p.o. daily.  Continue Toprol-XL 37.5 mg daily.  Please send lab order for repeat basic metabolic panel and magnesium to Dr. Juel Burrow office.  2. AKI (a cute kidney injury) ([place]) Recent creatinine 1.76 improved from prior high of 2.07 back in March 26, 2020.  She sees [name] nephrology  3. Atrial fibrillation, unspecified type ([place]) Heart rate today is 93.  At last visit Dr. Harl Bowie increased Toprol to 37.5 mg p.o. daily.  Continue Coumadin as directed.  Follow with Coumadin clinic  Medication Adjustments/Labs and Tests Ordered: Current medicines are reviewed at length with the patient today.  Concerns regarding medicines are outlined above.   I spent a total of   15    minutes with patient during telemedicine visit.   Disposition: Follow-up with [facility] or APP 3 months.  Signed, Levell July, NP 08/08/2020 1:27 PM    [facility] [place], [place], [place] [address] Phone: [phone]; Fax: [phone]

## 2020-08-08 ENCOUNTER — Telehealth (INDEPENDENT_AMBULATORY_CARE_PROVIDER_SITE_OTHER): Payer: Medicare Other | Admitting: Family Medicine

## 2020-08-08 ENCOUNTER — Encounter: Payer: Self-pay | Admitting: Family Medicine

## 2020-08-08 VITALS — BP 130/75 | HR 93 | Ht 59.0 in | Wt 171.0 lb

## 2020-08-08 DIAGNOSIS — N179 Acute kidney failure, unspecified: Secondary | ICD-10-CM | POA: Diagnosis not present

## 2020-08-08 DIAGNOSIS — I4891 Unspecified atrial fibrillation: Secondary | ICD-10-CM

## 2020-08-08 DIAGNOSIS — I5032 Chronic diastolic (congestive) heart failure: Secondary | ICD-10-CM

## 2020-08-08 NOTE — Patient Instructions (Signed)
Medication Instructions:  Continue all current medications.  Labwork: BMET, Mg orders faxed to pcp.   Testing/Procedures: none  Follow-Up: 3 months   Any Other Special Instructions Will Be Listed Below (If Applicable).  If you need a refill on your cardiac medications before your next appointment, please call your pharmacy.

## 2020-08-08 NOTE — Addendum Note (Signed)
Addended by: Laurine Blazer on: 08/08/2020 05:43 PM   Modules accepted: Orders

## 2020-08-09 ENCOUNTER — Other Ambulatory Visit: Payer: Self-pay | Admitting: *Deleted

## 2020-08-09 NOTE — Patient Instructions (Signed)
COPD Action Plan A COPD action plan is a description of what to do when you have a flare (exacerbation) of chronic obstructive pulmonary disease (COPD). Your action plan is a color-coded plan that lists the symptoms that indicate whether your condition is under control and what actions to take.  If you have symptoms in the green zone, it means you are doing well that day.  If you have symptoms in the yellow zone, it means you are having a bad day or an exacerbation.  If you have symptoms in the red zone, you need urgent medical care. Follow the plan that you and your health care provider developed. Review your plan with your health care provider at each visit. Red zone Symptoms in this zone mean that you should get medical help right away. They include:  Feeling very short of breath, even when you are resting.  Not being able to do any activities because of poor breathing.  Not being able to sleep because of poor breathing.  Fever or shaking chills.  Feeling confused or very sleepy.  Chest pain.  Coughing up blood. If you have any of these symptoms, call emergency services (911 in the [place] or go to the nearest emergency room.   Yellow zone Symptoms in this zone mean that your condition may be getting worse. They include:  Feeling more short of breath than usual.  Having less energy for daily activities than usual.  Phlegm or mucus that is thicker than usual.  Needing to use your rescue inhaler or nebulizer more often than usual.  More ankle swelling than usual.  Coughing more than usual.  Feeling like you have a chest cold.  Trouble sleeping due to COPD symptoms.  Decreased appetite.  COPD medicines not helping as much as usual. If you experience any "yellow" symptoms:  Keep taking your daily medicines as directed.  Use your quick-relief inhaler as told by your health care provider.  If you were prescribed steroid medicine to take by mouth (oral medicine), start  taking it as told by your health care provider.  If you were prescribed an antibiotic medicine, start taking it as told by your health care provider. Do not stop taking the antibiotic even if you start to feel better.  Use oxygen as told by your health care provider.  Get more rest.  Do your pursed-lip breathing exercises.  Do not smoke. Avoid any irritants in the air. If your signs and symptoms do not improve after taking these steps, call your health care provider right away.   Green zone Symptoms in this zone mean that you are doing well. They include:  Being able to do your usual activities and exercise.  Having the usual amount of coughing, including the same amount of phlegm or mucus.  Being able to sleep well.  Having a good appetite.   Where to find more information: You can find more information about COPD from:  American Lung Association, My COPD Action Plan: www.lung.org  COPD Foundation: www.copdfoundation.[place]: https://wilson-eaton.com/ Follow these instructions at home:  Continue taking your daily medicines as told by your health care provider.  Make sure you receive all the immunizations that your health care provider recommends, especially the pneumococcal and influenza vaccines.  Wash your hands often with soap and water. Have family members wash their hands too. Regular hand washing can help prevent infections.  Follow your usual exercise and diet plan.  Avoid irritants in the air,  such as smoke.  Do not use any products that contain nicotine or tobacco. These products include cigarettes, chewing tobacco, and vaping devices, such as e-cigarettes. If you need help quitting, ask your health care provider. Summary  A COPD action plan tells you what to do when you have a flare (exacerbation) of chronic obstructive pulmonary disease (COPD).  Follow each action plan for your symptoms. If you have any symptoms in the red zone,  call emergency services (911 in the [place] or go to the nearest emergency room. This information is not intended to replace advice given to you by your health care provider. Make sure you discuss any questions you have with your health care provider. Document Revised: 03/28/2020 Document Reviewed: 03/28/2020 Elsevier Patient Education  2021 [place]. Hemoglobin A1C Test Why am I having this test? You may have the hemoglobin A1C test (A1C test) done to:  Evaluate your risk for developing diabetes (diabetes mellitus).  Diagnose diabetes.  Monitor long-term control of blood sugar (glucose) in people who have diabetes and help make treatment decisions. This test may be done with other blood glucose tests, such as fasting blood glucose and oral glucose tolerance tests. What is being tested? Hemoglobin is a type of protein in the blood that carries oxygen. Glucose attaches to hemoglobin to form glycated hemoglobin. This test checks the amount of glycated hemoglobin in your blood, which is a good indicator of the average amount of glucose in your blood during the past 2-3 months. What kind of sample is taken? A blood sample is required for this test. It is usually collected by inserting a needle into a blood vessel.   Tell a health care provider about:  All medicines you are taking, including vitamins, herbs, eye drops, creams, and over-the-counter medicines.  Any blood disorders you have.  Any surgeries you have had.  Any medical conditions you have.  Whether you are pregnant or may be pregnant. How are the results reported? Your results will be reported as a percentage that indicates how much of your hemoglobin has glucose attached to it (is glycated). Your health care provider will compare your results to normal ranges that were established after testing a large group of people (reference ranges). Reference ranges may vary among labs and hospitals. For this test, common reference ranges  are:  Adult or child without diabetes: 4-5.6%.  Adult or child with diabetes and good blood glucose control: less than 7%. What do the results mean? If you have diabetes:  A result of less than 7% is considered normal, meaning that your blood glucose is well controlled.  A result higher than 7% means that your blood glucose is not well controlled, and your treatment plan may need to be adjusted. If you do not have diabetes:  A result within the reference range is considered normal, meaning that you are not at high risk for diabetes.  A result of 5.7-6.4% means that you have a high risk of developing diabetes, and you have prediabetes. Prediabetes is the condition of having a blood glucose level that is higher than it should be but not high enough for you to be diagnosed with diabetes. Having prediabetes puts you at risk for developing type 2 diabetes. You may have more tests, including a repeat A1C test.  Results of 6.5% or higher on two separate A1C tests mean that you have diabetes. You may have more tests to confirm the diagnosis. Abnormally low A1C values may be caused by:  Pregnancy.  Severe blood loss.  Receiving donated blood (transfusions).  Low red blood cell count (anemia).  Long-term kidney failure.  Some unusual forms (variants) of hemoglobin. Talk with your health care provider about what your results mean. Questions to ask your health care provider Ask your health care provider, or the department that is doing the test:  When will my results be ready?  How will I get my results?  What are my treatment options?  What other tests do I need?  What are my next steps? Summary  The A1C test may be done to evaluate your risk for developing diabetes, to diagnose diabetes, and to monitor long-term control of blood sugar (glucose) in people who have diabetes and help make treatment decisions.  Hemoglobin is a type of protein in the blood that carries oxygen.  Glucose attaches to hemoglobin to form glycated hemoglobin. This test checks the amount of glycated hemoglobin in your blood, which is a good indicator of the average amount of glucose in your blood during the past 2-3 months.  Talk with your health care provider about what your results mean. This information is not intended to replace advice given to you by your health care provider. Make sure you discuss any questions you have with your health care provider. Document Revised: 02/16/2020 Document Reviewed: 02/16/2020 Elsevier Patient Education  2021 [employer].

## 2020-08-09 NOTE — Patient Outreach (Signed)
[facility]) Care Management  [place]  08/09/2020   Denise Freeman 28-Sep-1950 [id number]   Telephone Assessment  Telephone Assessment Complex caremanagement   Inpatient Admission at [facility] 10/18-10/20/21 ( left AMA) Dx: Hyperglycemia Hyperosmolar non-ketotic state. Inpatient admission [facility] 10/21-10/25/21 Acute Heart Failure ED visit [facility] 12/17, UTI ,Pneumonia, Fall at home  Patient is a 70 year old female with PMHX: Combined Systolic and Diastolic Heart Failure, Chronic hypoxic respiratory failure, chronic oxygen therapyat 3 litersOSA with Bipap, Chronic pain syndrome, hypertension,Chronic Atrial Fib,Diabetes( A1c 13.9% on 6/2/21per [facility])Depression with anxiety, partially blind both eyes, hard of hearing  Subjective:  Successful outreach call to patient she reports feeling okay except like she is getting a cold, no fever, productive clear sputum oxygen at 3 liters at nasal cannula with oxygen level at 90 to 93% range with usual shortness with activity, she reports that having to change some appointments due to having bad days and just doesn't feel like getting out. Reviewed worsening symptoms of COPD and action plan to notify MD of sooner. She discussed being informed at recent cardiology appointment that she had covid pneumonia a few months ago and it will take several months to get strength.  She reports taking all medications as prescribed.  She discussed plan to call pulmonary MD to be seen in [place] area. She reports having appointment with PCP office on tomorrow for lab work prior to her office appointment she plans to see if she can be seen while at the office at that time.   Patient reports that her weight is at 171, no sudden increase in weight no increase in swelling.  She reports her blood range being 150's to 200 at times she states that she is different reporting having symptoms of low blood sugar  when her readings are at 150's. She reports not having a good appetite, eating one meal and days and small snacks. She declines use of glucerna like product stating she does not like it .    She discussed home health services completed and [facility] paramedicine program completed. She asked about follow up on palliative care program, reviewed PCP to discuss referral at office visit.    Encounter Medications:  Outpatient Encounter Medications as of 08/09/2020  Medication Sig Note  . albuterol (PROVENTIL) (2.5 MG/3ML) 0.083% nebulizer solution Take 3 mLs (2.5 mg total) by nebulization every 6 (six) hours as needed for wheezing or shortness of breath.   . allopurinol (ZYLOPRIM) 100 MG tablet TAKE 1 TABLET BY MOUTH ONCE DAILY. (Patient taking differently: Take 100 mg by mouth daily.)   . budesonide (PULMICORT) 0.5 MG/2ML nebulizer solution Take 2 mLs (0.5 mg total) by nebulization 2 (two) times daily.   . cetirizine (ZYRTEC) 10 MG tablet Take 10 mg by mouth daily.   . cholecalciferol (VITAMIN D) 400 units TABS tablet Take 1 tablet (400 Units total) by mouth 2 (two) times daily.   . clobetasol cream (TEMOVATE) 7.42 % Apply 1 application topically daily.    . colchicine 0.6 MG tablet Take 0.6 mg by mouth daily as needed. gout   . FEROSUL 325 (65 Fe) MG tablet Take 325 mg by mouth daily.    . fluticasone (FLONASE) 50 MCG/ACT nasal spray Place 1 spray into both nostrils 2 (two) times daily.   . furosemide (LASIX) 40 MG tablet TAKE 3 TABLETS BY MOUTH TWICE DAILY   . Insulin Glargine (BASAGLAR KWIKPEN) 100 UNIT/ML Inject 20 Units into the skin  2 (two) times daily.   . insulin lispro (HUMALOG) 100 UNIT/ML KiwkPen Inject 0.02 mLs (2 Units total) into the skin 3 (three) times daily before meals.   Marland Kitchen ipratropium (ATROVENT) 0.02 % nebulizer solution Take 2.5 mLs (0.5 mg total) by nebulization every 6 (six) hours as needed for wheezing or shortness of breath.   . metolazone (ZAROXOLYN) 2.5 MG tablet TAKE 1 TABLET  BY MOUTH ONCE DAILY.   . metoprolol succinate (TOPROL XL) 25 MG 24 hr tablet Take 1.5 tablets (37.5 mg total) by mouth daily.   . mirtazapine (REMERON) 15 MG tablet Take 1 tablet (15 mg total) by mouth at bedtime. 03/22/2020: LF: 03/08/2020 DS:28   . modafinil (PROVIGIL) 200 MG tablet Take 1 tablet (200 mg total) by mouth daily. 03/22/2020: LF: 03/08/2020 DS:28   . montelukast (SINGULAIR) 10 MG tablet Take 1 tablet (10 mg total) by mouth at bedtime. 03/22/2020: LF: 03/08/2020 DS:28   . nitroGLYCERIN (NITROSTAT) 0.4 MG SL tablet Place 0.4 mg under the tongue every 5 (five) minutes as needed for chest pain.  03/22/2020: Pt hasn't needed  . oxyCODONE-acetaminophen (PERCOCET/ROXICET) 5-325 MG tablet Take 1 tablet by mouth 2 (two) times daily as needed for severe pain. 03/22/2020: LF:03/01/2020 DS:30   . pantoprazole (PROTONIX) 20 MG tablet TAKE (1) TABLET BY MOUTH TWICE A DAY BEFORE MEALS. (BREAKFAST AND SUPPER) (Patient taking differently: Take 20 mg by mouth 2 (two) times daily before a meal.) 03/22/2020: LF: 03/08/2020 DS:28   . primidone (MYSOLINE) 50 MG tablet TAKE ONE TABLET BY MOUTH AT BEDTIME. (Patient taking differently: Take 50 mg by mouth at bedtime.) 03/22/2020: LF: 03/08/2020 DS:28   . Probiotic Product (PROBIOTIC PO) Take 1 tablet by mouth at bedtime.   . TRUE METRIX BLOOD GLUCOSE TEST test strip USE TO TEST BLOOD SUGAR AS DIRECTED.   . Vitamin A 2400 MCG (8000 UT) CAPS Take 8,000 Units by mouth every morning.  03/22/2020: LF: 03/08/2020 DS: 28  . Vitamin D, Ergocalciferol, (DRISDOL) 1.25 MG (50000 UNIT) CAPS capsule Take 1 capsule (50,000 Units total) by mouth every 7 (seven) days.   Marland Kitchen warfarin (COUMADIN) 4 MG tablet TAKE 1 TABLET BY MOUTH ONCE A DAY OR AS DIRECTED BY THE COUMADIN CLINIC   . [DISCONTINUED] potassium chloride 20 MEQ TBCR Take 20 mEq by mouth 2 (two) times daily.    No facility-administered encounter medications on file as of 08/09/2020.    Functional Status:  In your  present state of health, do you have any difficulty performing the following activities: 03/23/2020 03/20/2020  Hearing? [place]? Y -  Difficulty concentrating or making decisions? N -  Walking or climbing stairs? Y -  Comment - -  Dressing or bathing? Y -  Comment - -  Doing errands, shopping? [place] and eating ? - -  Comment - -  Using the Toilet? - -  In the past six months, have you accidently leaked urine? - -  Do you have problems with loss of bowel control? - -  Managing your Medications? - -  Managing your Finances? - -  Comment - -  Housekeeping or managing your Housekeeping? - -  Comment - -  Some recent data might be hidden    Fall/Depression Screening: Fall Risk  02/01/2020 06/11/2019 05/17/2019  Falls in the past year? 1 1 1   Number falls in past yr: 0 1 1  Injury with Fall? 0 1 1  Comment - - -  Risk Factor Category  - - -  Comment - - -  Risk for fall due to : History of fall(s);Impaired balance/gait;Impaired vision History of fall(s) History of fall(s);Impaired vision;Impaired mobility  Risk for fall due to: Comment - - -  Follow up Falls prevention discussed Falls evaluation completed;Education provided Education provided;Falls prevention discussed   PHQ 2/9 Scores 05/17/2019 11/28/2017 11/27/2017 10/03/2017 09/15/2017 09/05/2017 08/07/2017  PHQ - 2 Score 0 0 5 6 0 0 0  PHQ- 9 Score - - - 23 - - -    Assessment: Patient will benefit from ongoing  Chronic care management with conditions , agreeable to ongoing telephonic management of conditions. Will transition to Chronic care management program with monthly outreaches patient agreeable.  Goals Addressed            This Visit's Progress   . Make and Keep All Appointments   On track    Follow Up Date 08/31/20 Timeframe:  Long-Range Goal Priority:  High Start Date:    03/29/20                         Expected End Date: 09/29/20                     - call to cancel if needed - keep a  calendar with appointment dates Reschedule missed appointments  reinforced    Why is this important?   Part of staying healthy is seeing the doctor for follow-up care.  If you forget your appointments, there are some things you can do to stay on track.    Notes:  Reinforced attending scheduled appointments     . Manage My Medicine   On track    Follow Up Date 09/29/20 Timeframe:  Long-Range Goal Priority:  High Start Date:     03/14/20                       Expected End Date:  09/29/20                       - call for medicine refill 2 or 3 days before it runs out  - take medications as prescribed per most recent after visit summary   Why is this important?   These steps will help you keep on track with your medicines.    Notes:     Marland Kitchen Monitor and Manage My Blood Sugar   On track    Follow Up Date 08/31/20 Timeframe:  Long-Range Goal Priority:  High Start Date:  03/14/20                           Expected End Date:                        - check blood sugar at prescribed times - check blood sugar if I feel it is too high or too low - take the blood sugar meter to all doctor visits  Replace libre sensor to monitor blood sugars as recommended and with assistance of office as needed    Why is this important?   Checking your blood sugar at home helps to keep it from getting very high or very low.  Writing the results in a diary or log helps the doctor know how to care for you.  Your blood sugar log  should have the time, date and the results.  Also, write down the amount of insulin or other medicine that you take.  Other information, like what you ate, exercise done and how you were feeling, will also be helpful.     Notes: Reviewed recent blood sugars, this am 150, denies reading at 70 or below reports having symptoms of hypoglycemia with reading at 150's. Reinforced balanced meals.     [facility] Level Eye Exam   Not on track    Follow Up Date 06/01/20 Timeframe:  Short-Term  Goal Priority:  Medium Start Date:   19/12/21                          Expected End Date:  07/03/20                       - keep appointment with eye doctor  Rescheduled missed appointments  encouraged    Why is this important?   Eye check-ups are important when you have diabetes.  Vision loss can be prevented.    Notes:  Reinforced importance of eye exam with diabetes condition. Agreed to reschedule     . Set My Target A1C   Not on track    Follow Up Date 08/31/20 Timeframe:  Long-Range Goal Priority:  Medium Start Date:    03/14/20                         Expected End Date:   10/30/20                    - set target A1C    Why is this important?   Your target A1C is decided together by you and your doctor.  It is based on several things like your age and other health issues.    Notes:  Has upcoming PCP appointment for A1c and lab check     . Track and Manage Activity and Exertion   On track    Follow Up Date: 09/30/20 Timeframe:  Long-Range Goal Priority:  Medium Start Date:   03/14/20                          Expected End Date: 09/30/20                        - pace activity allowing for rest  -participate with home health therapy    Why is this important?   Exercising is very important when managing your heart failure.  It will help your heart get stronger.    Notes:  Fall safety, use of walker, bedside commode     . Track and Manage Fluids and Swelling   On track    Follow Up Date 07/03/20 Timeframe:  Long-Range Goal Priority:  Medium Start Date:     03/14/20                        Expected End Date:   09/30/20                    - call office if I gain more than 2 pounds in one day or 5 pounds in one week - do ankle pumps when sitting - keep legs up while sitting - track weight in diary - watch for  swelling in feet, ankles and legs every day - weigh myself daily    Why is this important?   It is important to check your weight daily and watch how much salt  and liquids you have.  It will help you to manage your heart failure.    Notes: commended on continued daily weight monitoring and keeping log.     . Track and Manage Symptoms   On track    Follow Up Date 07/03/20 Timeframe:  Long-Range Goal Priority:  Medium Start Date:   03/14/20                          Expected End Date: 09/30/20                         - bring diary to all appointments - follow rescue plan if symptoms flare-up - know when to call the doctor - dress right for the weather, hot or cold  Reinforced daily heart failure management skills limiting salt, weighing daily, taking medications as prescribed   Why is this important?   You will be able to handle your symptoms better if you keep track of them.  Making some simple changes to your lifestyle will help.  Eating healthy is one thing you can do to take good care of yourself.    Notes: Review worsening heart failure zone symptoms non identified as current        Plan:  Follow-up:  Patient agrees to Care Plan and Follow-up.agreeable to follow up call in the next 2 weeks     Joylene Draft, RN, BSN  [place] Management Coordinator  [phone]- Mobile [phone]- [place]

## 2020-08-10 DIAGNOSIS — L989 Disorder of the skin and subcutaneous tissue, unspecified: Secondary | ICD-10-CM | POA: Diagnosis not present

## 2020-08-10 DIAGNOSIS — Z6836 Body mass index (BMI) 36.0-36.9, adult: Secondary | ICD-10-CM | POA: Diagnosis not present

## 2020-08-10 DIAGNOSIS — R21 Rash and other nonspecific skin eruption: Secondary | ICD-10-CM | POA: Diagnosis not present

## 2020-08-10 DIAGNOSIS — R251 Tremor, unspecified: Secondary | ICD-10-CM | POA: Diagnosis not present

## 2020-08-10 DIAGNOSIS — G8929 Other chronic pain: Secondary | ICD-10-CM | POA: Diagnosis not present

## 2020-08-10 DIAGNOSIS — K219 Gastro-esophageal reflux disease without esophagitis: Secondary | ICD-10-CM | POA: Diagnosis not present

## 2020-08-10 DIAGNOSIS — E559 Vitamin D deficiency, unspecified: Secondary | ICD-10-CM | POA: Diagnosis not present

## 2020-08-10 DIAGNOSIS — R590 Localized enlarged lymph nodes: Secondary | ICD-10-CM | POA: Diagnosis not present

## 2020-08-10 DIAGNOSIS — E782 Mixed hyperlipidemia: Secondary | ICD-10-CM | POA: Diagnosis not present

## 2020-08-10 DIAGNOSIS — M109 Gout, unspecified: Secondary | ICD-10-CM | POA: Diagnosis not present

## 2020-08-10 DIAGNOSIS — J449 Chronic obstructive pulmonary disease, unspecified: Secondary | ICD-10-CM | POA: Diagnosis not present

## 2020-08-10 DIAGNOSIS — K5900 Constipation, unspecified: Secondary | ICD-10-CM | POA: Diagnosis not present

## 2020-08-10 DIAGNOSIS — J9610 Chronic respiratory failure, unspecified whether with hypoxia or hypercapnia: Secondary | ICD-10-CM | POA: Diagnosis not present

## 2020-08-11 ENCOUNTER — Telehealth: Payer: Self-pay | Admitting: *Deleted

## 2020-08-11 DIAGNOSIS — Z79899 Other long term (current) drug therapy: Secondary | ICD-10-CM

## 2020-08-11 DIAGNOSIS — E782 Mixed hyperlipidemia: Secondary | ICD-10-CM

## 2020-08-11 NOTE — Telephone Encounter (Signed)
-----   Message from Verta Ellen., NP sent at 08/11/2020 10:39 AM EST ----- Recent basic metabolic panel t shows creatinine 1.84, GFR 29.  Potassium 3.6. Her potassium is back to normal.  However her kidney function appears to be worse.  Have her stop the metolazone for now.   Lipid panel shows elevated cholesterol at 213, triglycerides 204, HDL 37, LDL 139.  She needs to be on a statin medication.  Start Lipitor 20 mg daily and check an FLP and LFT in 6 to 8 weeks.

## 2020-08-21 MED ORDER — ATORVASTATIN CALCIUM 20 MG PO TABS: 20.0000 mg | ORAL_TABLET | Freq: Every day | ORAL | 3 refills | Status: AC

## 2020-08-21 NOTE — Telephone Encounter (Signed)
Patient informed and verbalized understanding of plan. Lab order faxed to [facility] 

## 2020-08-25 ENCOUNTER — Other Ambulatory Visit: Payer: Self-pay

## 2020-08-25 NOTE — Telephone Encounter (Signed)
Refill request from [facility] for patient's Modafinil 200mg .  Last refill: 02/17/2020 #30 with 5 refills.  I have pended rx to preferred pharmacy as requested.  VS please advise on refill, thanks!

## 2020-08-26 ENCOUNTER — Telehealth: Payer: Self-pay | Admitting: Cardiology

## 2020-08-26 NOTE — Telephone Encounter (Signed)
Patient called in reporting " i've got fluid on me again". States she was recently told to stop her metolazone 2.5mg  daily as her renal function was elevated. Over the past week she has developed increased LE edema and shortness of breath. Has been taking lasix 80mg  BID but her urine output has decreased. Wears 3L [place] at all times, has not increased amount. No chest pain. In the past has been on lasix 120mg  BID along with metolazone 2.5mg  daily. Review of labs from 3/11 showed Cr 1.84. Looks like baseline has been around 1.7. I advised that she could start back on her metolazone 2.5mg  today and tomorrow to see if this helps increase her UOP and alleviate symptoms. Advised low threshold to present to the ED if not improving which she is hesitant to do as she has had bad experiences at [facility] but agreeable if she does not feel any better. Will route to PCP cardiologist and scheduling in [employer] in attempts to get her in for early appt next week with repeat BMET. She voiced understanding and thanked me for the follow up call.

## 2020-08-28 MED ORDER — MODAFINIL 200 MG PO TABS: 200.0000 mg | ORAL_TABLET | Freq: Every day | ORAL | 5 refills | Status: AC

## 2020-08-28 NOTE — Telephone Encounter (Signed)
Left message on voicemail for patient to call back and schedule follow up appointment.

## 2020-08-29 ENCOUNTER — Other Ambulatory Visit: Payer: Self-pay | Admitting: *Deleted

## 2020-08-29 NOTE — Patient Outreach (Signed)
[facility]) Care Management  08/29/2020  Denise Freeman [facility] [birth year] [id number]   Telephone Assessment Chronic  caremanagement   Inpatient Admission at [facility] 10/18-10/20/21 ( left AMA) Dx: Hyperglycemia Hyperosmolar non-ketotic state. Inpatient admission [facility] 10/21-10/25/21 Acute Heart Failure ED visit [facility] 12/17, UTI ,Pneumonia, Fall at home  Patient is a 70 year old female with PMHX: Combined Systolic and Diastolic Heart Failure, Chronic hypoxic respiratory failure, chronic oxygen therapyat 3 litersOSA with Bipap, Chronic pain syndrome, hypertension,Chronic Atrial Fib,Diabetes( A1c 13.9% on 6/2/21per [facility])Depression with anxiety, partially blind both eyes, hard of hearing  Subjective Unsuccessful outreach call to patient no answer able to leave a HIPAA compliant voicemail message for return call.    Plan Will await return call from patient If no return call , will plan return call in the next 2 weeks.     Joylene Draft, RN, BSN  [place] Management Coordinator  [phone]- Mobile [phone]- Toll Free Main Office

## 2020-08-30 DIAGNOSIS — M797 Fibromyalgia: Secondary | ICD-10-CM | POA: Diagnosis not present

## 2020-08-30 DIAGNOSIS — E782 Mixed hyperlipidemia: Secondary | ICD-10-CM | POA: Diagnosis not present

## 2020-08-30 DIAGNOSIS — J069 Acute upper respiratory infection, unspecified: Secondary | ICD-10-CM | POA: Diagnosis not present

## 2020-08-30 DIAGNOSIS — N189 Chronic kidney disease, unspecified: Secondary | ICD-10-CM | POA: Diagnosis not present

## 2020-08-30 DIAGNOSIS — K219 Gastro-esophageal reflux disease without esophagitis: Secondary | ICD-10-CM | POA: Diagnosis not present

## 2020-08-30 DIAGNOSIS — E1165 Type 2 diabetes mellitus with hyperglycemia: Secondary | ICD-10-CM | POA: Diagnosis not present

## 2020-08-30 DIAGNOSIS — Z712 Person consulting for explanation of examination or test findings: Secondary | ICD-10-CM | POA: Diagnosis not present

## 2020-08-30 DIAGNOSIS — G894 Chronic pain syndrome: Secondary | ICD-10-CM | POA: Diagnosis not present

## 2020-08-30 DIAGNOSIS — G2581 Restless legs syndrome: Secondary | ICD-10-CM | POA: Diagnosis not present

## 2020-08-30 DIAGNOSIS — M79604 Pain in right leg: Secondary | ICD-10-CM | POA: Diagnosis not present

## 2020-08-31 ENCOUNTER — Telehealth: Payer: Medicare Other | Admitting: Cardiology

## 2020-08-31 ENCOUNTER — Telehealth: Payer: Self-pay | Admitting: Cardiology

## 2020-08-31 NOTE — Telephone Encounter (Signed)
This was already addressed on 3/26 phone note - pt needs to reschedule appt - attempted to contact pt 4 times today

## 2020-08-31 NOTE — Telephone Encounter (Signed)
  1. Are you currently SOB (can you hear that pt is SOB on the phone)? SOME  2. How long have you been experiencing SOB?  Off and on for 3 weeks   3. Are you SOB when sitting or when up moving around? both  4. Are you currently experiencing any other symptoms? Retaining fluid everywhere  Had Virtual appointment today however patient states that she was sleeping and did not hear the phone.

## 2020-08-31 NOTE — Progress Notes (Unsigned)
{Choose 1 Note Type (Video or Telephone):[phone]}    Date:  08/31/2020   ID:  Denise Freeman, DOB [birth year], MRN [id number] The patient was identified using 2 identifiers.  {Patient Location:([phone]::"Home"} {Provider Location:[phone]::"Home Office"}   PCP:  Denise Lawrence, NP   [place]  Cardiologist:  Denise Dolly, MD *** Advanced Practice Provider:  No care team member to display Electrophysiologist:  None  {Press F2 to show EP APP, CHF, sleep or structural heart MD               :[id number]}  { Click here to update then REFRESH NOTE - MD (PCP) or APP (Team Member)  Change PCP Type for MD, Specialty for APP is either Cardiology or Clinical Cardiac Electrophysiology  :[id number]}   Evaluation Performed:  {Choose Visit Type:[phone]::"Follow-Up Visit"}  Chief Complaint:  ***  History of Present Illness:    Denise Freeman is a 70 y.o. female with ***  seen today for follow up of the following medical problems.    1. Chronic combined systolic/diastolic HF - 33/2951 echo LVEF 50-55%, grade II DDx, mild RV dysfunction - recent admission 03/2020 with volume overload, was diuresed. Home lasix increased to 40mg  bid.  - ongoign issues with edema at discharge. Was taking lasix up to 120mg  bid.  - at tele visit with Denise Freeman metolazone 2.5mg  daily added - from charting torsemide listed as ineffective for her.   - weight 168 lbs, 4 months ago 174 lbs - home weights 167-171 lbs.   - compliant with meds - some swelling at times - breathing is at her baseline  - metolazone held after rise in Cr - she called 08/26/20 to report edema had increased.   2. Permanent afib - prior issues with GI bleeding but has been on coumadin by primary cardiolgist without issues. - some recent palpitations, 2-3 times a day. Can last about 10 min   3. COPD  - on home O2  4. Low bp - ER visit 05/19/20 with low bp's 90s/50s - CXR with bilateral  pneumonia, also found to have UTI. She refused covid test  BNP 8000 K 2.8 Cr 3.12 BUN 138  - baserline CR around 2.  - reports bp's are up and down.  - discharged on antibiotic, and followed up with pcp   5. CKD - followed by Denise Freeman. Baseline Cr around 2, during recent ER visit CR 3 and BUN up to 138   The patient {does/does not:200015} have symptoms concerning for COVID-19 infection (fever, chills, cough, or new shortness of breath).    Past Medical History:  Diagnosis Date  . Allergy   . Anemia   . Anxiety   . Asthma   . Atrial fibrillation ([place])   . Bipolar 1 disorder ([place])   . 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 Cholelithiasis    a. 09/2016 s/p Lap Chole.  . Chronic bronchitis ([place])   . Chronic combined systolic and diastolic congestive heart failure ([place])    a. 09/2016 Echo: EF 30-35%.  . Colon polyps   . COPD (chronic obstructive pulmonary disease) ([place])   . Depression   . Family history of adverse reaction to anesthesia    Uncle was positive for malignant hyperthermia; patient had testing done and was negative.  . Fibromyalgia   . GERD (gastroesophageal reflux disease)   . Gout   . High cholesterol   . History of blood transfusion 06/2015   "  bleeding from my rectum"  . History of hiatal hernia   . HOH (hard of hearing)   . Hx of colonic polyps 03/21/2016   3 small adenomas no recall - co-morbidities  . Neuropathy    Disc Back   . NICM (nonischemic cardiomyopathy) ([place])    a. Previously worked up in [place], MD-->low EF with subsequent recovery.  Multiple caths (last ~ 2014 per pt report)--reportedly nl cors;  b. 09/2016 Echo: EF 30-35%, antsept/apical HK, mild MR, mildly dil LA, mod dil RA;  c. 09/2016 Lexi MV: EF 26%, glob HK, sept DK, med size, mod intensity fixed septal defect - BBB/PVC related artifact, no ischemia.  . On home oxygen therapy    "3L; 24/7" (03/14/2016)  . OSA treated with BiPAP    uses biPAP, 10 (03/14/2016)  .  Osteoarthritis   . Oxygen deficiency   . Pneumonia   . Type II diabetes mellitus ([place])    Past Surgical History:  Procedure Laterality Date  . APPENDECTOMY     "they busted"  . bladder stimulator     pt states, "it cannot be turned off; it's in my right hip; dead battery so it's not working anymore". (03/14/2016)  . BLADDER SUSPENSION     2003, 2006 and 2010  . CATARACT EXTRACTION W/PHACO Right 11/29/2014   Procedure: CATARACT EXTRACTION PHACO AND INTRAOCULAR LENS PLACEMENT (IOC);  Surgeon: Rutherford Guys, MD;  Location: [facility];  Service: Ophthalmology;  Laterality: Right;  CDE:3.81  . CATARACT EXTRACTION W/PHACO Left 12/13/2014   Procedure: CATARACT EXTRACTION PHACO AND INTRAOCULAR LENS PLACEMENT (IOC);  Surgeon: Rutherford Guys, MD;  Location: [facility];  Service: Ophthalmology;  Laterality: Left;  CDE:6.59  . CERVICAL DISC SURGERY N/A 2009   4, 6, and 7 cervical disc replaced  . CHOLECYSTECTOMY N/A 09/13/2016   Procedure: LAPAROSCOPIC CHOLECYSTECTOMY;  Surgeon: Rolm Bookbinder, MD;  Location: [place];  Service: General;  Laterality: N/A;  . COLONOSCOPY WITH PROPOFOL N/A 03/15/2016   Procedure: COLONOSCOPY WITH PROPOFOL;  Surgeon: Gatha Mayer, MD;  Location: [place];  Service: Endoscopy;  Laterality: N/A;  . ESOPHAGOGASTRODUODENOSCOPY (EGD) WITH PROPOFOL N/A 03/15/2016   Procedure: ESOPHAGOGASTRODUODENOSCOPY (EGD) WITH PROPOFOL;  Surgeon: Gatha Mayer, MD;  Location: [facility];  Service: Endoscopy;  Laterality: N/A;  . HEEL SPUR SURGERY Bilateral   . HERNIA REPAIR    . I & D EXTREMITY Right 06/13/2015   Procedure: MINOR IRRIGATION AND DEBRIDEMENT EXTREMITY REMOVAL OF NAIL;  Surgeon: Daryll Brod, MD;  Location: [place];  Service: Orthopedics;  Laterality: Right;  . TUBAL LIGATION    . UMBILICAL HERNIA REPAIR     w/mesh     No outpatient medications have been marked as taking for the 08/31/20 encounter (Appointment) with Arnoldo Lenis, MD.     Allergies:    Oseltamivir, Xarelto [rivaroxaban], Ancef [cefazolin], Levaquin [levofloxacin in d5w], Lyrica [pregabalin], Tamiflu [oseltamivir phosphate], Zoloft [sertraline hcl], Torsemide, Augmentin [amoxicillin-pot clavulanate], Ciprofloxacin, Haldol [haloperidol], Nsaids, Penicillins, and Topamax [topiramate]   Social History   Tobacco Use  . Smoking status: Former Smoker    Packs/day: 2.00    Years: 14.00    Pack years: 28.00    Types: Cigarettes    Quit date: 04/12/1989    Years since quitting: 31.4  . Smokeless tobacco: Never Used  Vaping Use  . Vaping Use: Never used  Substance Use Topics  . Alcohol use: No    Alcohol/week: 0.0 standard drinks  . Drug use: No  Family Hx: The patient's family history includes Bipolar disorder in her brother; Breast cancer in her mother; COPD in her mother and sister; Cancer in her maternal grandfather and maternal grandmother; Diabetes in her mother and sister; Heart disease in her father, maternal grandmother, mother, and sister; Hyperlipidemia in her father.  ROS:   Please see the history of present illness.    *** All other systems reviewed and are negative.   Prior CV studies:   The following studies were reviewed today:  ***  Labs/Other Tests and Data Reviewed:    EKG:  {EKG/Telemetry Strips Reviewed:[phone]}  Recent Labs: 06/08/2020: Brain Natriuretic Peptide 177; BUN 111; Creat 1.76; Hemoglobin 11.4; Magnesium 1.5; Platelets 244; Potassium 3.2; Sodium 139   Recent Lipid Panel Lab Results  Component Value Date/Time   CHOL 163 07/29/2017 11:15 AM   TRIG 183 (H) 07/29/2017 11:15 AM   HDL 40 07/29/2017 11:15 AM   CHOLHDL 4.1 07/29/2017 11:15 AM   CHOLHDL 6.2 09/10/2016 11:47 AM   LDLCALC 86 07/29/2017 11:15 AM   LDLDIRECT 153.0 04/19/2016 09:32 AM    Wt Readings from Last 3 Encounters:  08/08/20 171 lb (77.6 kg)  06/07/20 168 lb 9.6 oz (76.5 kg)  03/24/20 168 lb 1.6 oz (76.2 kg)     Risk Assessment/Calculations:   {Does  this patient have ATRIAL FIBRILLATION?:[phone]}  Objective:    Vital Signs:  There were no vitals taken for this visit.   {[facility] Virtual Exam (Optional):[phone]::"VITAL SIGNS:  reviewed"}  ASSESSMENT & PLAN:    1. Chronic diastolic HF - appears euvolemic by exam, though exam limited due to body habitus - very high BNP from recent ER visit, will repeat - on high dose diuretics, repeat kidney labs may need to stop metolazone.  2. AKI on CKD - repeat labs, may need to adjust diuretics  3. Afib - some recent palpitations, increase toprol to 37.5mg  bid    {Are you ordering a CV Procedure (e.g. stress test, cath, DCCV, TEE, etc)?   Press F2        :[id number]}    COVID-19 Education: The signs and symptoms of COVID-19 were discussed with the patient and how to seek care for testing (follow up with PCP or arrange E-visit).  ***The importance of social distancing was discussed today.  Time:   Today, I have spent *** minutes with the patient with telehealth technology discussing the above problems.     Medication Adjustments/Labs and Tests Ordered: Current medicines are reviewed at length with the patient today.  Concerns regarding medicines are outlined above.   Tests Ordered: No orders of the defined types were placed in this encounter.   Medication Changes: No orders of the defined types were placed in this encounter.   Follow Up:  {F/U Format:[phone]} {follow up:15908}  Signed, Denise Dolly, MD  08/31/2020 9:56 AM    [facility]

## 2020-09-04 NOTE — Progress Notes (Addendum)
Virtual Visit via Telephone Note   This visit type was conducted due to national recommendations for restrictions regarding the COVID-19 Pandemic (e.g. social distancing) in an effort to limit this patient's exposure and mitigate transmission in our community.  Due to Denise Freeman co-morbid illnesses, this patient is at least at moderate risk for complications without adequate follow up.  This format is felt to be most appropriate for this patient at this time.  The patient did not have access to video technology/had technical difficulties with video requiring transitioning to audio format only (telephone).  All issues noted in this document were discussed and addressed.  No physical exam could be performed with this format.  Please refer to the patient's chart for Denise Freeman  consent to telehealth for [facility].    Date:  09/05/2020   ID:  Denise Freeman, DOB August 05, 1950, MRN [id number] The patient was identified using 2 identifiers.  Patient Location: Home Provider Location: Office/Clinic   PCP:  [name], NP   [place]  Cardiologist:  Carlyle Dolly, MD  Advanced Practice Provider:  No care team member to display Electrophysiologist:  None  :[phone]}   Evaluation Performed:  Follow-Up Visit  Chief Complaint : Lower extremity edema, shortness of breath  History of Present Illness:    Denise Freeman is a 70 y.o. female with chronic combined systolic and diastolic heart failure, permanent atrial fib, COPD, low blood pressure, CKD.  Recent phone encounter with Denise Bellis, NP cardiology 08/26/2020 stating Denise Freeman was having increased fluid.  Denise Freeman stated Denise Freeman was instructed to stop Denise Freeman metolazone 2.5 mg daily due to Denise Freeman decreased renal function.  Denise Freeman did develop lower extremity edema and shortness of breath.  Had been taking Lasix 80 mg p.o. twice daily but urine output had decreased.  Wearing 3 L nasal cannula at all times.  Denise Freeman was started back on Denise Freeman metolazone  2.5 mg on that day and the following day. To follow-up with primary cardiology  Had a recent admission on October 2021 with volume overload.  Was diuresed.  Home Lasix increased to 40 mg p.o. twice daily.  Undergoing issues with edema at discharge.  Was taking Lasix up to 120 mg p.o. twice daily.  At telemedicine visit with [name]  metolazone 2.5 mg daily was added.  Torsemide was listed as ineffective for Denise Freeman.  Weight had been 168 pounds 4 months prior.  Recent weight was 174.  Home weights 167-171.  Denise Freeman was compliant with Denise Freeman medications.  Metolazone was held after increasing creatinine.  Followed by [name] with a baseline creatinine around 2.  Recent ER visit was creatinine of 3 and BUN up to 138.  Appeared euvolemic by exam though exam limited due to body habitus.  Very high BNP from recent ER visit.  Plan to repeat BNP and repeat kidney labs.  May need to stop metolazone and/or adjust other diuretics.  Recent palpitations.  Increasing Toprol to 37.5 mg daily.   Spoke with Denise Freeman by phone today.  Denise Freeman states Denise Freeman is continuing to have issues with weight gain and shortness of breath.  States Denise Freeman can barely walk around without shortness of breath.  Denise Freeman is concerned Denise Freeman urine output is not adequate since Denise Freeman metolazone was stopped.  Denise Freeman is currently taking 120 mg of Lasix p.o. twice daily without much output.  Recent lab work from. PCP on 08/10/2020 showed a creatinine of 1.8 and GFR of 29.  Denise Freeman baseline creatinine is around 2.  Denise Freeman states Denise Freeman has not seen [name] nephrology in a while.  States Denise Freeman weight today is 172.     The patient does not have symptoms concerning for COVID-19 infection (fever, chills, cough, or new shortness of breath).     Past Medical History:  Diagnosis Date  . Allergy   . Anemia   . Anxiety   . Asthma   . Atrial fibrillation ([place])   . Bipolar 1 disorder ([place])   . 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 Cholelithiasis    a. 09/2016 s/p Lap Chole.  .  Chronic bronchitis ([place])   . Chronic combined systolic and diastolic congestive heart failure ([place])    a. 09/2016 Echo: EF 30-35%.  . Colon polyps   . COPD (chronic obstructive pulmonary disease) ([place])   . Depression   . Family history of adverse reaction to anesthesia    Uncle was positive for malignant hyperthermia; patient had testing done and was negative.  . Fibromyalgia   . GERD (gastroesophageal reflux disease)   . Gout   . High cholesterol   . History of blood transfusion 06/2015   "bleeding from my rectum"  . History of hiatal hernia   . HOH (hard of hearing)   . Hx of colonic polyps 03/21/2016   3 small adenomas no recall - co-morbidities  . Neuropathy    Disc Back   . NICM (nonischemic cardiomyopathy) ([place])    a. Previously worked up in [place], MD-->low EF with subsequent recovery.  Multiple caths (last ~ 2014 per pt report)--reportedly nl cors;  b. 09/2016 Echo: EF 30-35%, antsept/apical HK, mild MR, mildly dil LA, mod dil RA;  c. 09/2016 Lexi MV: EF 26%, glob HK, sept DK, med size, mod intensity fixed septal defect - BBB/PVC related artifact, no ischemia.  . On home oxygen therapy    "3L; 24/7" (03/14/2016)  . OSA treated with BiPAP    uses biPAP, 10 (03/14/2016)  . Osteoarthritis   . Oxygen deficiency   . Pneumonia   . Type II diabetes mellitus ([place])    Past Surgical History:  Procedure Laterality Date  . APPENDECTOMY     "they busted"  . bladder stimulator     pt states, "it cannot be turned off; it's in my right hip; dead battery so it's not working anymore". (03/14/2016)  . BLADDER SUSPENSION     2003, 2006 and 2010  . CATARACT EXTRACTION W/PHACO Right 11/29/2014   Procedure: CATARACT EXTRACTION PHACO AND INTRAOCULAR LENS PLACEMENT (IOC);  Surgeon: Rutherford Guys, MD;  Location: [facility];  Service: Ophthalmology;  Laterality: Right;  CDE:3.81  . CATARACT EXTRACTION W/PHACO Left 12/13/2014   Procedure: CATARACT EXTRACTION PHACO AND INTRAOCULAR LENS PLACEMENT (IOC);   Surgeon: Rutherford Guys, MD;  Location: [facility];  Service: Ophthalmology;  Laterality: Left;  CDE:6.59  . CERVICAL DISC SURGERY N/A 2009   4, 6, and 7 cervical disc replaced  . CHOLECYSTECTOMY N/A 09/13/2016   Procedure: LAPAROSCOPIC CHOLECYSTECTOMY;  Surgeon: Rolm Bookbinder, MD;  Location: [place];  Service: General;  Laterality: N/A;  . COLONOSCOPY WITH PROPOFOL N/A 03/15/2016   Procedure: COLONOSCOPY WITH PROPOFOL;  Surgeon: Gatha Mayer, MD;  Location: [place];  Service: Endoscopy;  Laterality: N/A;  . ESOPHAGOGASTRODUODENOSCOPY (EGD) WITH PROPOFOL N/A 03/15/2016   Procedure: ESOPHAGOGASTRODUODENOSCOPY (EGD) WITH PROPOFOL;  Surgeon: Gatha Mayer, MD;  Location: [place];  Service: Endoscopy;  Laterality: N/A;  . HEEL SPUR SURGERY Bilateral   . HERNIA  REPAIR    . I & D EXTREMITY Right 06/13/2015   Procedure: MINOR IRRIGATION AND DEBRIDEMENT EXTREMITY REMOVAL OF NAIL;  Surgeon: Daryll Brod, MD;  Location: [place];  Service: Orthopedics;  Laterality: Right;  . TUBAL LIGATION    . UMBILICAL HERNIA REPAIR     w/mesh     Current Meds  Medication Sig  . albuterol (PROVENTIL) (2.5 MG/3ML) 0.083% nebulizer solution Take 3 mLs (2.5 mg total) by nebulization every 6 (six) hours as needed for wheezing or shortness of breath.  . allopurinol (ZYLOPRIM) 100 MG tablet TAKE 1 TABLET BY MOUTH ONCE DAILY. (Patient taking differently: Take 100 mg by mouth daily.)  . budesonide (PULMICORT) 0.5 MG/2ML nebulizer solution Take 2 mLs (0.5 mg total) by nebulization 2 (two) times daily.  . cetirizine (ZYRTEC) 10 MG tablet Take 10 mg by mouth daily.  . cholecalciferol (VITAMIN D) 400 units TABS tablet Take 1 tablet (400 Units total) by mouth 2 (two) times daily.  . clobetasol cream (TEMOVATE) 0.86 % Apply 1 application topically daily.   . colchicine 0.6 MG tablet Take 0.6 mg by mouth daily as needed. gout  . FEROSUL 325 (65 Fe) MG tablet Take 325 mg by mouth daily.   . fluticasone  (FLONASE) 50 MCG/ACT nasal spray Place 1 spray into both nostrils 2 (two) times daily.  . furosemide (LASIX) 40 MG tablet TAKE 3 TABLETS BY MOUTH TWICE DAILY  . Insulin Glargine (BASAGLAR KWIKPEN) 100 UNIT/ML Inject 20 Units into the skin 2 (two) times daily. (Patient taking differently: Inject 30 Units into the skin 2 (two) times daily.)  . insulin lispro (HUMALOG) 100 UNIT/ML KiwkPen Inject 0.02 mLs (2 Units total) into the skin 3 (three) times daily before meals.  Marland Kitchen ipratropium (ATROVENT) 0.02 % nebulizer solution Take 2.5 mLs (0.5 mg total) by nebulization every 6 (six) hours as needed for wheezing or shortness of breath.  . metoprolol succinate (TOPROL XL) 25 MG 24 hr tablet Take 1.5 tablets (37.5 mg total) by mouth daily.  . mirtazapine (REMERON) 15 MG tablet Take 1 tablet (15 mg total) by mouth at bedtime.  . modafinil (PROVIGIL) 200 MG tablet Take 1 tablet (200 mg total) by mouth daily.  . montelukast (SINGULAIR) 10 MG tablet Take 1 tablet (10 mg total) by mouth at bedtime.  Marland Kitchen oxyCODONE-acetaminophen (PERCOCET/ROXICET) 5-325 MG tablet Take 1 tablet by mouth 2 (two) times daily as needed for severe pain.  . pantoprazole (PROTONIX) 20 MG tablet TAKE (1) TABLET BY MOUTH TWICE A DAY BEFORE MEALS. (BREAKFAST AND SUPPER) (Patient taking differently: Take 20 mg by mouth 2 (two) times daily before a meal.)  . primidone (MYSOLINE) 50 MG tablet TAKE ONE TABLET BY MOUTH AT BEDTIME. (Patient taking differently: Take 50 mg by mouth at bedtime.)  . Probiotic Product (PROBIOTIC PO) Take 1 tablet by mouth at bedtime.  . revefenacin (YUPELRI) 175 MCG/3ML nebulizer solution Take 175 mcg by nebulization daily.  . TRUE METRIX BLOOD GLUCOSE TEST test strip USE TO TEST BLOOD SUGAR AS DIRECTED.  . Vitamin A 2400 MCG (8000 UT) CAPS Take 8,000 Units by mouth every morning.   . Vitamin D, Ergocalciferol, (DRISDOL) 1.25 MG (50000 UNIT) CAPS capsule Take 1 capsule (50,000 Units total) by mouth every 7 (seven) days.  Marland Kitchen  warfarin (COUMADIN) 4 MG tablet TAKE 1 TABLET BY MOUTH ONCE A DAY OR AS DIRECTED BY THE COUMADIN CLINIC (Patient taking differently: TAKE 1 TABLET BY MOUTH ONCE A DAY OR AS DIRECTED -  managed by pcp)     Allergies:   Oseltamivir, Xarelto [rivaroxaban], Ancef [cefazolin], Levaquin [levofloxacin in d5w], Lyrica [pregabalin], Tamiflu [oseltamivir phosphate], Zoloft [sertraline hcl], Torsemide, Augmentin [amoxicillin-pot clavulanate], Ciprofloxacin, Haldol [haloperidol], Nsaids, Penicillins, and Topamax [topiramate]   Social History   Tobacco Use  . Smoking status: Former Smoker    Packs/day: 2.00    Years: 14.00    Pack years: 28.00    Types: Cigarettes    Quit date: 04/12/1989    Years since quitting: 31.4  . Smokeless tobacco: Never Used  Vaping Use  . Vaping Use: Never used  Substance Use Topics  . Alcohol use: No    Alcohol/week: 0.0 standard drinks  . Drug use: No     Family Hx: The patient's family history includes Bipolar disorder in Denise Freeman brother; Breast cancer in Denise Freeman mother; COPD in Denise Freeman mother and sister; Cancer in Denise Freeman maternal grandfather and maternal grandmother; Diabetes in Denise Freeman mother and sister; Heart disease in Denise Freeman father, maternal grandmother, mother, and sister; Hyperlipidemia in Denise Freeman father.  ROS:   Please see the history of present illness.    All other systems reviewed and are negative.   Prior CV studies:   The following studies were reviewed today:  Echocardiogram 04/29/2019  1. Left ventricular ejection fraction, by visual estimation, is 50 to 55%. The left ventricle has low normal function. There is mildly increased left ventricular hypertrophy. 2. Elevated left atrial pressure. 3. Left ventricular diastolic parameters are consistent with Grade II diastolic dysfunction (pseudonormalization). 4. Global right ventricle has mildly reduced systolic function.The right ventricular size is moderately enlarged. No increase in right ventricular wall thickness. 5.  Left atrial size was moderately dilated. 6. Right atrial size was moderately dilated. 7. The mitral valve is normal in structure. No evidence of mitral valve regurgitation. No evidence of mitral stenosis. 8. The tricuspid valve is normal in structure. Tricuspid valve regurgitation is trivial. 9. The aortic valve has an indeterminant number of cusps. Aortic valve regurgitation is not visualized. No evidence of aortic valve sclerosis or stenosis. 10. The pulmonic valve was not well visualized. Pulmonic valve regurgitation is mild. 11. Mildly elevated pulmonary artery systolic pressure. 12. The inferior vena cava is dilated in size with >50% respiratory variability, suggesting right atrial pressure of 8 mmHg.  Labs/Other Tests and Data Reviewed:    EKG: No EKG due to to virtual visit today  Recent Labs: 06/08/2020: Brain Natriuretic Peptide 177; BUN 111; Creat 1.76; Hemoglobin 11.4; Magnesium 1.5; Platelets 244; Potassium 3.2; Sodium 139   Recent Lipid Panel Lab Results  Component Value Date/Time   CHOL 163 07/29/2017 11:15 AM   TRIG 183 (H) 07/29/2017 11:15 AM   HDL 40 07/29/2017 11:15 AM   CHOLHDL 4.1 07/29/2017 11:15 AM   CHOLHDL 6.2 09/10/2016 11:47 AM   LDLCALC 86 07/29/2017 11:15 AM   LDLDIRECT 153.0 04/19/2016 09:32 AM    Wt Readings from Last 3 Encounters:  09/05/20 172 lb (78 kg)  08/08/20 171 lb (77.6 kg)  06/07/20 168 lb 9.6 oz (76.5 kg)     Risk Assessment/Calculations:    CHA2DS2-VASc Score = 5  This indicates a 7.2% annual risk of stroke. The patient's score is based upon: CHF History: Yes HTN History: Yes Diabetes History: Yes Stroke History: No Vascular Disease History: No Age Score: 1 Gender Score: 1     Objective:    Vital Signs:  BP 126/69   Pulse 98   Ht 4\' 11"  (1.499 m)   Wt  172 lb (78 kg)   BMI 34.74 kg/m    VITAL SIGNS:  reviewed   Speaking in a normal speech pattern.  No dyspnea, cough, or wheezing noted during phone  conversation.  ASSESSMENT & PLAN:     1. Chronic diastolic heart failure ([place]) Denise Freeman is complaining of an increased weight and problems getting rid of fluid.  Denise Freeman states Denise Freeman weighs 172 pounds today.  Denise Freeman is currently taking Lasix 120 mg p.o. twice daily.  Denise Freeman states Denise Freeman was previously on metolazone but due decreased renal function Denise Freeman was stopped.  States Denise Freeman is having issues with shortness of breath just walking across the floor.  Recent creatinine 1.84 with GFR of 29 on 08/10/2020.  Please repeat echocardiogram to reassess LV function diastolic function, valvular function.  Continue Lasix 120 mg p.o. twice daily for now.  2. Stage 3 chronic kidney disease, unspecified whether stage 3a or 3b CKD ([place]) Please refer back to [name] to reevaluate renal disease.  Recent creatinine of 1.84 and GFR of 29.  Denise Freeman metolazone was stopped due to decreasing renal function.  Denise Freeman has been alternating 2.5 mg metolazone with 5 mg every other day per Denise Freeman statement when Denise Freeman was seeing [name].  3. Atrial fibrillation, unspecified type (HCC)/palpitations He denies any palpitations or arrhythmias currently.  Heart rate today is 98.  Continue Toprol-XL 37.5 mg p.o. daily continue Coumadin as directed by Coumadin clinic   COVID-19 Education: The signs and symptoms of COVID-19 were discussed with the patient and how to seek care for testing (follow up with PCP or arrange E-visit).  The importance of social distancing was discussed today.  Time:   Today, I have spent 15 minutes with the patient with telehealth technology discussing the above problems.     Medication Adjustments/Labs and Tests Ordered: Current medicines are reviewed at length with the patient today.  Concerns regarding medicines are outlined above.   Tests Ordered: No orders of the defined types were placed in this encounter.   Medication Changes: No orders of the defined types were placed in this encounter.   Follow Up:  In Person in 6  week(s)  Signed, Verta Ellen, NP  09/05/2020 2:49 PM    [place]

## 2020-09-05 ENCOUNTER — Encounter: Payer: Self-pay | Admitting: Family Medicine

## 2020-09-05 ENCOUNTER — Telehealth (INDEPENDENT_AMBULATORY_CARE_PROVIDER_SITE_OTHER): Payer: Medicare Other | Admitting: Family Medicine

## 2020-09-05 ENCOUNTER — Telehealth: Payer: Self-pay | Admitting: Pulmonary Disease

## 2020-09-05 VITALS — BP 126/69 | HR 98 | Ht 59.0 in | Wt 172.0 lb

## 2020-09-05 DIAGNOSIS — I4891 Unspecified atrial fibrillation: Secondary | ICD-10-CM | POA: Diagnosis not present

## 2020-09-05 DIAGNOSIS — N183 Chronic kidney disease, stage 3 unspecified: Secondary | ICD-10-CM | POA: Diagnosis not present

## 2020-09-05 DIAGNOSIS — I5032 Chronic diastolic (congestive) heart failure: Secondary | ICD-10-CM

## 2020-09-05 DIAGNOSIS — R0602 Shortness of breath: Secondary | ICD-10-CM

## 2020-09-05 DIAGNOSIS — R635 Abnormal weight gain: Secondary | ICD-10-CM

## 2020-09-05 NOTE — Telephone Encounter (Signed)
PA request was received from (pharmacy): [facility]:[phone] Fax: [phone] Medication name and strength: modafinil 200mg  Ordering Provider: VS  Was PA started with CMM?: Yes If yes, please enter KEY: [id number] Medication tried and failed: modafinil 100mg  Covered Alternatives: N/A  PA sent to plan, time frame for approval / denial: N/A Routing to [place] for follow-up

## 2020-09-05 NOTE — Telephone Encounter (Signed)
Checked on status of PA. PA has been approved for  coverage from 06/07/2020 - 09/05/2021. Spoke with [employer] and they are aware that medication has been approved. Nothing further needed at this time.

## 2020-09-05 NOTE — Patient Instructions (Signed)
Medication Instructions:  Continue all current medications.  Labwork: BMET, Mg - orders done for [facility].  Testing/Procedures: Your physician has requested that you have an echocardiogram. Echocardiography is a painless test that uses sound waves to create images of your heart. It provides your doctor with information about the size and shape of your heart and how well your heart's chambers and valves are working. This procedure takes approximately one hour. There are no restrictions for this procedure.  Follow-Up:  Office will contact with results via phone or letter.    6-8 weeks   Any Other Special Instructions Will Be Listed Below (If Applicable). You have been referred to:  Nephrology   If you need a refill on your cardiac medications before your next appointment, please call your pharmacy.

## 2020-09-07 ENCOUNTER — Other Ambulatory Visit: Payer: Self-pay | Admitting: Family Medicine

## 2020-09-07 ENCOUNTER — Other Ambulatory Visit: Payer: Medicare Other

## 2020-09-07 DIAGNOSIS — R0602 Shortness of breath: Secondary | ICD-10-CM

## 2020-09-07 DIAGNOSIS — I509 Heart failure, unspecified: Secondary | ICD-10-CM

## 2020-09-11 ENCOUNTER — Other Ambulatory Visit: Payer: Self-pay | Admitting: *Deleted

## 2020-09-11 ENCOUNTER — Telehealth: Payer: Self-pay | Admitting: Cardiology

## 2020-09-11 ENCOUNTER — Other Ambulatory Visit: Payer: Medicare Other

## 2020-09-11 NOTE — Telephone Encounter (Signed)
Have patient increase her Lasix to 60 mg twice a day for the next 3 days.  That would be an extra half a pill in addition to her 40 mg pill twice a day for the next 3 to 4 days.  Tell her to go back to 40 mg twice a day after that.

## 2020-09-11 NOTE — Telephone Encounter (Signed)
Pt is over loaded with fluid to the point she's unable to move around and gets very short of breath.   She's had to r/s her Echo 2x and she's unable to get out of the house to have lab work due to being so short of breath.   Please call [phone]

## 2020-09-11 NOTE — Patient Outreach (Signed)
[facility]) Care Management  [facility] Care Manager  09/11/2020   Denise Freeman May 06, 1951 [id number]   Telephone Assessment Chronic  caremanagement  Patient is a 70 year old female with PMHX: Combined Systolic and Diastolic Heart Failure, Chronic hypoxic respiratory failure, chronic oxygen therapyat 3 litersOSA with Bipap, Chronic pain syndrome, hypertension,Chronic Atrial Fib,Diabetes( A1c 13.9% on 6/2/21per [facility])Depression with anxiety, partially blind both eyes, hard of hearing   Subjective:  Incoming call from patient , she discussed making contact with Cardiology office regarding her weights staying in the 172-173 range usual range  168-171. She reports having swelling in her legs, not urinating as much since Metolazone was stopped due to concern related to kidney function .She reports being more tired lately and taking longer for  usual activity and she has had to cancel some of her appointment due to being to worn out to get to appointments.  She discussed continuing to wear oxygen  at 3 liters with oxygen saturations at 95-98%. She reports having difficulty with Bipap machine tubing not fitting well and difficulty using it states she is using another machine unable to states name of machine. She reports contacting [employer] and has a home visit planned for 3/14 to check out her tubing and also to review and work with her on use of home device to monitor her INR.  Patient asked about Palliative care services, states she did not discuss when she was at provider visit in the last month with Ebony at Dr. [place] Crane office.  She discussed that she has decided to  transfer her care to prior provider [name], NP at [place] and has an appointment on 4/13. Patient requested assistance with making contact about Palliative Care.    Encounter Medications:  Outpatient Encounter Medications as of 09/11/2020  Medication Sig Note  . albuterol (PROVENTIL) (2.5  MG/3ML) 0.083% nebulizer solution Take 3 mLs (2.5 mg total) by nebulization every 6 (six) hours as needed for wheezing or shortness of breath.   . allopurinol (ZYLOPRIM) 100 MG tablet TAKE 1 TABLET BY MOUTH ONCE DAILY. (Patient taking differently: Take 100 mg by mouth daily.)   . atorvastatin (LIPITOR) 20 MG tablet Take 1 tablet (20 mg total) by mouth daily. (Patient not taking: Reported on 09/05/2020) 09/05/2020: States she never got it.   . budesonide (PULMICORT) 0.5 MG/2ML nebulizer solution Take 2 mLs (0.5 mg total) by nebulization 2 (two) times daily.   . cetirizine (ZYRTEC) 10 MG tablet Take 10 mg by mouth daily.   . cholecalciferol (VITAMIN D) 400 units TABS tablet Take 1 tablet (400 Units total) by mouth 2 (two) times daily.   . clobetasol cream (TEMOVATE) 4.09 % Apply 1 application topically daily.    . colchicine 0.6 MG tablet Take 0.6 mg by mouth daily as needed. gout   . FEROSUL 325 (65 Fe) MG tablet Take 325 mg by mouth daily.    . fluticasone (FLONASE) 50 MCG/ACT nasal spray Place 1 spray into both nostrils 2 (two) times daily.   . furosemide (LASIX) 40 MG tablet TAKE 3 TABLETS BY MOUTH TWICE DAILY   . Insulin Glargine (BASAGLAR KWIKPEN) 100 UNIT/ML Inject 20 Units into the skin 2 (two) times daily. (Patient taking differently: Inject 30 Units into the skin 2 (two) times daily.)   . insulin lispro (HUMALOG) 100 UNIT/ML KiwkPen Inject 0.02 mLs (2 Units total) into the skin 3 (three) times daily before meals.   Marland Kitchen ipratropium (ATROVENT) 0.02 % nebulizer solution Take  2.5 mLs (0.5 mg total) by nebulization every 6 (six) hours as needed for wheezing or shortness of breath.   . metoprolol succinate (TOPROL XL) 25 MG 24 hr tablet Take 1.5 tablets (37.5 mg total) by mouth daily.   . mirtazapine (REMERON) 15 MG tablet Take 1 tablet (15 mg total) by mouth at bedtime. 03/22/2020: LF: 03/08/2020 DS:28   . modafinil (PROVIGIL) 200 MG tablet Take 1 tablet (200 mg total) by mouth daily.   . montelukast  (SINGULAIR) 10 MG tablet Take 1 tablet (10 mg total) by mouth at bedtime. 03/22/2020: LF: 03/08/2020 DS:28   . oxyCODONE-acetaminophen (PERCOCET/ROXICET) 5-325 MG tablet Take 1 tablet by mouth 2 (two) times daily as needed for severe pain. 03/22/2020: LF:03/01/2020 DS:30   . pantoprazole (PROTONIX) 20 MG tablet TAKE (1) TABLET BY MOUTH TWICE A DAY BEFORE MEALS. (BREAKFAST AND SUPPER) (Patient taking differently: Take 20 mg by mouth 2 (two) times daily before a meal.) 03/22/2020: LF: 03/08/2020 DS:28   . primidone (MYSOLINE) 50 MG tablet TAKE ONE TABLET BY MOUTH AT BEDTIME. (Patient taking differently: Take 50 mg by mouth at bedtime.) 03/22/2020: LF: 03/08/2020 DS:28   . Probiotic Product (PROBIOTIC PO) Take 1 tablet by mouth at bedtime.   . revefenacin (YUPELRI) 175 MCG/3ML nebulizer solution Take 175 mcg by nebulization daily.   . TRUE METRIX BLOOD GLUCOSE TEST test strip USE TO TEST BLOOD SUGAR AS DIRECTED.   . Vitamin A 2400 MCG (8000 UT) CAPS Take 8,000 Units by mouth every morning.  03/22/2020: LF: 03/08/2020 DS: 28  . Vitamin D, Ergocalciferol, (DRISDOL) 1.25 MG (50000 UNIT) CAPS capsule Take 1 capsule (50,000 Units total) by mouth every 7 (seven) days.   Marland Kitchen warfarin (COUMADIN) 4 MG tablet TAKE 1 TABLET BY MOUTH ONCE A DAY OR AS DIRECTED BY THE COUMADIN CLINIC (Patient taking differently: TAKE 1 TABLET BY MOUTH ONCE A DAY OR AS DIRECTED - managed by pcp)   . [DISCONTINUED] potassium chloride 20 MEQ TBCR Take 20 mEq by mouth 2 (two) times daily.    No facility-administered encounter medications on file as of 09/11/2020.    Functional Status:  In your present state of health, do you have any difficulty performing the following activities: 03/23/2020 03/20/2020  Hearing? [place]? Y -  Difficulty concentrating or making decisions? N -  Walking or climbing stairs? Y -  Comment - -  Dressing or bathing? Y -  Comment - -  Doing errands, shopping? [place] and eating  ? - -  Comment - -  Using the Toilet? - -  In the past six months, have you accidently leaked urine? - -  Do you have problems with loss of bowel control? - -  Managing your Medications? - -  Managing your Finances? - -  Comment - -  Housekeeping or managing your Housekeeping? - -  Comment - -  Some recent data might be hidden    Fall/Depression Screening: Fall Risk  02/01/2020 06/11/2019 05/17/2019  Falls in the past year? 1 1 1   Number falls in past yr: 0 1 1  Injury with Fall? 0 1 1  Comment - - -  Risk Factor Category  - - -  Comment - - -  Risk for fall due to : History of fall(s);Impaired balance/gait;Impaired vision History of fall(s) History of fall(s);Impaired vision;Impaired mobility  Risk for fall due to: Comment - - -  Follow up Falls prevention discussed Falls  evaluation completed;Education provided Education provided;Falls prevention discussed   PHQ 2/9 Scores 05/17/2019 11/28/2017 11/27/2017 10/03/2017 09/15/2017 09/05/2017 08/07/2017  PHQ - 2 Score 0 0 5 6 0 0 0  PHQ- 9 Score - - - 23 - - -    Assessment:  Goals Addressed            This Visit's Progress   . Find Help in My Community       Timeframe:  Short-Term Goal Priority:  Medium Start Date:  09/11/20                           Expected End Date:   10/30/20                    Follow Up Date 09/30/20   - follow-up on any referrals for help I am given    Why is this important?    Knowing how and where to find help for yourself or family in your neighborhood and community is an important skill.   You will want to take some steps to learn how.    Notes:  Per patient request placed  call to [facility], Serious illness program. Encouraged patient to discuss at PCP visit this week. Provided contact number to Palliative care services.     . Make and Keep All Appointments   Not on track    Follow Up Date 09/29/20 Timeframe:  Long-Range Goal Priority:  High Start Date:    03/29/20                          Expected End Date: 09/29/20                     - call to cancel if needed - keep a calendar with appointment dates Reschedule missed appointments  reinforced    Why is this important?   Part of staying healthy is seeing the doctor for follow-up care.  If you forget your appointments, there are some things you can do to stay on track.    Notes:  Reviewed upcoming appointments , with nephrology and PCP,verified transporation     . Manage My Medicine   On track    Follow Up Date 09/29/20 Timeframe:  Long-Range Goal Priority:  High Start Date:     03/14/20                       Expected End Date:  09/29/20                       - call for medicine refill 2 or 3 days before it runs out  - take medications as prescribed per most recent after visit summary   Why is this important?   These steps will help you keep on track with your medicines.    Notes:     Marland Kitchen Monitor and Manage My Blood Sugar   On track    Follow Up Date 10/30/20 Timeframe:  Long-Range Goal Priority:  High Start Date:  03/14/20                           Expected End Date:                        -  check blood sugar at prescribed times - check blood sugar if I feel it is too high or too low - take the blood sugar meter to all doctor visits  Replace libre sensor to monitor blood sugars as recommended and with assistance of office as needed    Why is this important?   Checking your blood sugar at home helps to keep it from getting very high or very low.  Writing the results in a diary or log helps the doctor know how to care for you.  Your blood sugar log should have the time, date and the results.  Also, write down the amount of insulin or other medicine that you take.  Other information, like what you ate, exercise done and how you were feeling, will also be helpful.     Notes: Reviewed recent blood sugars, this am 150 and "higher at times"  denies reading at 70 or below . Reinforced keeping record  encourages to take meter to PCP visit this week for review .     Marland Kitchen Obtain Eye Exam   Not on track    Follow Up Date 10/30/20 Timeframe:  Long-Range Goal Priority:  Medium Start Date:   1012/21                          Expected End Date:    10/30/20                   - keep appointment with eye doctor  Rescheduled missed appointments  encouraged    Why is this important?   Eye check-ups are important when you have diabetes.  Vision loss can be prevented.    Notes:  Reinforced importance of eye exam with diabetes condition,     . Perform Foot Care   Not on track    Follow Up Date 10/30/20 Timeframe:  Long-Range Goal Priority:  Medium Start Date:  03/14/20                           Expected End Date:  10/30/20                     - check feet daily for cuts, sores or redness - wash and dry feet carefully every day - wear comfortable, cotton socks - wear comfortable, well-fitting shoes    Why is this important?   Good foot care is very important when you have diabetes.  There are many things you can do to keep your feet healthy and catch a problem early.    Notes:  Again encourage  spouse to assist with observance of feet and with care     . Set My Target A1C   Not on track    Follow Up Date: 09/30/20 Timeframe:  Long-Range Goal Priority:  Medium Start Date:    03/14/20                         Expected End Date:   10/30/20                    - set target A1C    Why is this important?   Your target A1C is decided together by you and your doctor.  It is based on several things like your age and other health issues.    Notes:  Reinforced usual follow up  on A1c testing every 3 months,encouraged to make notes on questions to ask at provider visit     . Track and Manage Activity and Exertion   On track    Follow Up Date: 09/30/20 Timeframe:  Long-Range Goal Priority:  Medium Start Date:   03/14/20                          Expected End Date: 09/30/20                        -  pace activity allowing for rest  -participate with home health therapy    Why is this important?   Exercising is very important when managing your heart failure.  It will help your heart get stronger.    Notes:  Fall safety, use of walker, bedside commode     . Track and Manage Fluids and Swelling   Not on track    Follow Up Date 09/30/20 Timeframe:  Long-Range Goal Priority:  Medium Start Date:     03/14/20                        Expected End Date:   09/30/20                    - call office if I gain more than 2 pounds in one day or 5 pounds in one week - do ankle pumps when sitting - keep legs up while sitting - track weight in diary - watch for swelling in feet, ankles and legs every day - weigh myself daily    Why is this important?   It is important to check your weight daily and watch how much salt and liquids you have.  It will help you to manage your heart failure.    Notes: Reinforced continuing daily weight loss, review of Heart Action plan , reinforced low sodium diet, hidden salt in food that can contribute to fluid weight.  Patient discussed rinsing canned vegetables and using low salt soups again reinforced salt contents.     . Track and Manage Symptoms   On track    Follow Up Date 09/30/20 Timeframe:  Long-Range Goal Priority:  Medium Start Date:   03/14/20                          Expected End Date: 09/30/20                         - bring diary to all appointments - follow rescue plan if symptoms flare-up - know when to call the doctor - dress right for the weather, hot or cold  Reinforced daily heart failure management skills limiting salt, weighing daily, taking medications as prescribed   Why is this important?   You will be able to handle your symptoms better if you keep track of them.  Making some simple changes to your lifestyle will help.  Eating healthy is one thing you can do to take good care of yourself.    Notes: commended for notifying MD  office of concerns related to Heart failure        Plan:  Follow-up:  Patient agrees to Care Plan and Follow-up. Patient agreeable to follow up call in the next week.  Placed call to [facility] program  per patient request. Received return call from Pia Mau, explained patient interest in program and patient has requested contact number provided to her. Returned call to patient to explain return call and patient gave agreement that Palliative care make contact her with, return call to [facility] that patient agreeable to phone call.  Call to [facility] office to confirm patient has an appointment on 4/13 .   Joylene Draft, RN, BSN  [place] Management Coordinator  [phone]- Mobile [phone]- Toll Free Main Office

## 2020-09-11 NOTE — Telephone Encounter (Signed)
Pt c/o SOB and some swelling in feet and stomach weight today is 173lbs - says she is unable to get labs done and has rescheduled her echo appt for May - says it takes her to long to get ready to be able to go get labs and echo - is taking lasix 40 mg bid

## 2020-09-14 ENCOUNTER — Ambulatory Visit: Payer: Self-pay | Admitting: *Deleted

## 2020-09-15 NOTE — Telephone Encounter (Signed)
Tried to reach pt  X3 no return call

## 2020-09-19 ENCOUNTER — Other Ambulatory Visit: Payer: Self-pay | Admitting: *Deleted

## 2020-09-19 NOTE — Patient Outreach (Signed)
[facility]) Care Management  [place]  09/19/2020   Denise Freeman 14-Nov-1950 [id number]   Telephone Assessment    Patient is a 70 year old female with PMHX: Combined Systolic and Diastolic Heart Failure, Chronic hypoxic respiratory failure, chronic oxygen therapyat 3 litersOSA with Bipap, Chronic pain syndrome, hypertension,Chronic Atrial Fib,Diabetes( A1c 13.9% on 6/2/21per [facility])Depression with anxiety, partially blind both eyes, hard of hearing    Subjective:  Patient reports that she is beginning to feel a little better. She reports having diarrhea on last week and missed her appointments with [name], NP and Kidney doctor. She reports that she has rescheduled appointment with NP and also Nephrology.  She reports that she has some improvement in swelling reporting that her weights is still about 172 no sudden increases She denies worsening of shortness or swelling.  She discussed having visit from [place] and able to get Bipap machine and tubing checked and she has been able to use machine at night. She also reports [facility] reviewed how to use machine to monitor INR she states having instruction and acknowledging understanding of how to use and has contact number to call INR result in .  Patient denies having low blood sugar readings less than 70, reports today's reading this am 81, 89 at lunch, she reports higher reading have been in the 200's.  She reports hitting her great toe and nail area being bruised, reinforced making sure PCP seeing her foot at visit.  Patient reports having call from representative of Palliative/Serious illness program on last week and awaiting follow up call .   Objective:   Encounter Medications:  Outpatient Encounter Medications as of 09/19/2020  Medication Sig Note  . albuterol (PROVENTIL) (2.5 MG/3ML) 0.083% nebulizer solution Take 3 mLs (2.5 mg total) by nebulization every 6 (six) hours as needed for wheezing  or shortness of breath.   . allopurinol (ZYLOPRIM) 100 MG tablet TAKE 1 TABLET BY MOUTH ONCE DAILY. (Patient taking differently: Take 100 mg by mouth daily.)   . atorvastatin (LIPITOR) 20 MG tablet Take 1 tablet (20 mg total) by mouth daily. (Patient not taking: Reported on 09/05/2020) 09/05/2020: States she never got it.   . budesonide (PULMICORT) 0.5 MG/2ML nebulizer solution Take 2 mLs (0.5 mg total) by nebulization 2 (two) times daily.   . cetirizine (ZYRTEC) 10 MG tablet Take 10 mg by mouth daily.   . cholecalciferol (VITAMIN D) 400 units TABS tablet Take 1 tablet (400 Units total) by mouth 2 (two) times daily.   . clobetasol cream (TEMOVATE) 4.12 % Apply 1 application topically daily.    . colchicine 0.6 MG tablet Take 0.6 mg by mouth daily as needed. gout   . FEROSUL 325 (65 Fe) MG tablet Take 325 mg by mouth daily.    . fluticasone (FLONASE) 50 MCG/ACT nasal spray Place 1 spray into both nostrils 2 (two) times daily.   . furosemide (LASIX) 40 MG tablet TAKE 3 TABLETS BY MOUTH TWICE DAILY   . Insulin Glargine (BASAGLAR KWIKPEN) 100 UNIT/ML Inject 20 Units into the skin 2 (two) times daily. (Patient taking differently: Inject 30 Units into the skin 2 (two) times daily.)   . insulin lispro (HUMALOG) 100 UNIT/ML KiwkPen Inject 0.02 mLs (2 Units total) into the skin 3 (three) times daily before meals.   Marland Kitchen ipratropium (ATROVENT) 0.02 % nebulizer solution Take 2.5 mLs (0.5 mg total) by nebulization every 6 (six) hours as needed for wheezing or shortness of breath.   Marland Kitchen  metoprolol succinate (TOPROL XL) 25 MG 24 hr tablet Take 1.5 tablets (37.5 mg total) by mouth daily.   . mirtazapine (REMERON) 15 MG tablet Take 1 tablet (15 mg total) by mouth at bedtime. 03/22/2020: LF: 03/08/2020 DS:28   . modafinil (PROVIGIL) 200 MG tablet Take 1 tablet (200 mg total) by mouth daily.   . montelukast (SINGULAIR) 10 MG tablet Take 1 tablet (10 mg total) by mouth at bedtime. 03/22/2020: LF: 03/08/2020 DS:28   .  oxyCODONE-acetaminophen (PERCOCET/ROXICET) 5-325 MG tablet Take 1 tablet by mouth 2 (two) times daily as needed for severe pain. 03/22/2020: LF:03/01/2020 DS:30   . pantoprazole (PROTONIX) 20 MG tablet TAKE (1) TABLET BY MOUTH TWICE A DAY BEFORE MEALS. (BREAKFAST AND SUPPER) (Patient taking differently: Take 20 mg by mouth 2 (two) times daily before a meal.) 03/22/2020: LF: 03/08/2020 DS:28   . primidone (MYSOLINE) 50 MG tablet TAKE ONE TABLET BY MOUTH AT BEDTIME. (Patient taking differently: Take 50 mg by mouth at bedtime.) 03/22/2020: LF: 03/08/2020 DS:28   . Probiotic Product (PROBIOTIC PO) Take 1 tablet by mouth at bedtime.   . revefenacin (YUPELRI) 175 MCG/3ML nebulizer solution Take 175 mcg by nebulization daily.   . TRUE METRIX BLOOD GLUCOSE TEST test strip USE TO TEST BLOOD SUGAR AS DIRECTED.   . Vitamin A 2400 MCG (8000 UT) CAPS Take 8,000 Units by mouth every morning.  03/22/2020: LF: 03/08/2020 DS: 28  . Vitamin D, Ergocalciferol, (DRISDOL) 1.25 MG (50000 UNIT) CAPS capsule Take 1 capsule (50,000 Units total) by mouth every 7 (seven) days.   Marland Kitchen warfarin (COUMADIN) 4 MG tablet TAKE 1 TABLET BY MOUTH ONCE A DAY OR AS DIRECTED BY THE COUMADIN CLINIC (Patient taking differently: TAKE 1 TABLET BY MOUTH ONCE A DAY OR AS DIRECTED - managed by pcp)   . [DISCONTINUED] potassium chloride 20 MEQ TBCR Take 20 mEq by mouth 2 (two) times daily.    No facility-administered encounter medications on file as of 09/19/2020.    Functional Status:  In your present state of health, do you have any difficulty performing the following activities: 03/23/2020 03/20/2020  Hearing? [place]? Y -  Difficulty concentrating or making decisions? N -  Walking or climbing stairs? Y -  Comment - -  Dressing or bathing? Y -  Comment - -  Doing errands, shopping? [place] and eating ? - -  Comment - -  Using the Toilet? - -  In the past six months, have you accidently leaked urine? - -   Do you have problems with loss of bowel control? - -  Managing your Medications? - -  Managing your Finances? - -  Comment - -  Housekeeping or managing your Housekeeping? - -  Comment - -  Some recent data might be hidden    Fall/Depression Screening: Fall Risk  02/01/2020 06/11/2019 05/17/2019  Falls in the past year? 1 1 1   Number falls in past yr: 0 1 1  Injury with Fall? 0 1 1  Comment - - -  Risk Factor Category  - - -  Comment - - -  Risk for fall due to : History of fall(s);Impaired balance/gait;Impaired vision History of fall(s) History of fall(s);Impaired vision;Impaired mobility  Risk for fall due to: Comment - - -  Follow up Falls prevention discussed Falls evaluation completed;Education provided Education provided;Falls prevention discussed   PHQ 2/9 Scores 09/19/2020 05/17/2019 11/28/2017 11/27/2017 10/03/2017 09/15/2017 09/05/2017  PHQ -  2 Score 1 0 0 5 6 0 0  PHQ- 9 Score - - - - 23 - -    Assessment: Patient will benefit from ongoing support with managing chronic conditions.  Goals Addressed            This Visit's Progress   . Find Help in My Community   On track    Timeframe:  Short-Term Goal Priority:  Medium Start Date:  09/11/20                           Expected End Date:   10/30/20                    Follow Up Date 10/04/20   - follow-up on any referrals for help I am given    Why is this important?    Knowing how and where to find help for yourself or family in your neighborhood and community is an important skill.   You will want to take some steps to learn how.    Notes:  Per patient request placed  call to [facility], Serious illness program. Encouraged patient to discuss at PCP visit this week. Provided contact number to Palliative care services. Followed up with patient regarding contact from Palliative care of [facility].    . Make and Keep All Appointments   Not on track    Follow Up Date 10/04/20 Timeframe:   Long-Range Goal Priority:  High Start Date:    03/29/20                         Expected End Date: 09/29/20                     - call to cancel if needed - keep a calendar with appointment dates Reschedule missed appointments  reinforced    Why is this important?   Part of staying healthy is seeing the doctor for follow-up care.  If you forget your appointments, there are some things you can do to stay on track.    Notes:  Reviewed upcoming  rescheduled appointments , with nephrology and PCP, upcoming pulmonary and cardiology appointments , verified transporation     . Manage My Medicine   On track    Follow Up Date 10/04/20 Timeframe:  Long-Range Goal Priority:  High Start Date:     03/14/20                       Expected End Date:  09/29/20                       - call for medicine refill 2 or 3 days before it runs out  - take medications as prescribed per most recent after visit summary   Why is this important?   These steps will help you keep on track with your medicines.    Notes:     Marland Kitchen Monitor and Manage My Blood Sugar   Not on track    Follow Up Date 10/30/20 Timeframe:  Long-Range Goal Priority:  High Start Date:  03/14/20                           Expected End Date:  10/30/20                       -  check blood sugar at prescribed times - check blood sugar if I feel it is too high or too low - take the blood sugar meter to all doctor visits  Replace libre sensor to monitor blood sugars as recommended and with assistance of office as needed    Why is this important?   Checking your blood sugar at home helps to keep it from getting very high or very low.  Writing the results in a diary or log helps the doctor know how to care for you.  Your blood sugar log should have the time, date and the results.  Also, write down the amount of insulin or other medicine that you take.  Other information, like what you ate, exercise done and how you were feeling, will also be  helpful.     Notes: Reviewed recent blood sugars, this am 150 and "higher at times"  denies reading at 70 or below, discussed higher reading in 200's.  Reviewed normal blood ranges, Reinforced keeping record encourages to take meter to PCP visit this week for review .     Marland Kitchen Obtain Eye Exam   Not on track    Follow Up Date 10/30/20 Timeframe:  Long-Range Goal Priority:  Medium Start Date:   1012/21                          Expected End Date:    10/30/20                   - keep appointment with eye doctor  Rescheduled missed appointments  encouraged    Why is this important?   Eye check-ups are important when you have diabetes.  Vision loss can be prevented.    Notes:  Reinforced importance of eye exam with diabetes condition,     . Perform Foot Care   On track    Follow Up Date 10/30/20 Timeframe:  Long-Range Goal Priority:  Medium Start Date:  03/14/20                           Expected End Date:  10/30/20                     - check feet daily for cuts, sores or redness - wash and dry feet carefully every day - wear comfortable, cotton socks - wear comfortable, well-fitting shoes    Why is this important?   Good foot care is very important when you have diabetes.  There are many things you can do to keep your feet healthy and catch a problem early.    Notes:  Again encourage  spouse to assist with observance of feet and with care , will send education hand out foot care , reviewed measures. Encouraged to make sure provider looks at her foot at visit on 4/20 to concern of her toe after she hit it and complains of nailbed being bruised     . Set My Target A1C   On track    Follow Up Date: 09/30/20 Timeframe:  Long-Range Goal Priority:  Medium Start Date:    03/14/20                         Expected End Date:   10/30/20                    -  set target A1C    Why is this important?   Your target A1C is decided together by you and your doctor.  It is based on several things  like your age and other health issues.    Notes:  Reinforced usual follow up on A1c testing every 3 months,encouraged to make notes on questions to ask at provider visit     . Track and Manage Activity and Exertion   On track    Follow Up Date: 10/04/20 Timeframe:  Long-Range Goal Priority:  Medium Start Date:   03/14/20                          Expected End Date: 10/30/20                        - pace activity allowing for rest  -participate with home health therapy    Why is this important?   Exercising is very important when managing your heart failure.  It will help your heart get stronger.    Notes:  Fall safety, use of walker, bedside commode     . Track and Manage Fluids and Swelling   On track    Follow Up Date 10/04/20 Timeframe:  Long-Range Goal Priority:  Medium Start Date:     03/14/20                        Expected End Date:   09/30/20                    - call office if I gain more than 2 pounds in one day or 5 pounds in one week - do ankle pumps when sitting - keep legs up while sitting - track weight in diary - watch for swelling in feet, ankles and legs every day - weigh myself daily    Why is this important?   It is important to check your weight daily and watch how much salt and liquids you have.  It will help you to manage your heart failure.    Notes: Encouraged continuing daily weight monitoring and taking record at MD visits review of Heart failure  Action plan , reinforced low sodium diet, hidden salt in food that can contribute to fluid weight.  Patient discussed rinsing canned vegetables and using low salt soups again reinforced salt contents.     . Track and Manage Symptoms   On track    Follow Up Date 10/30/20 Timeframe:  Long-Range Goal Priority:  Medium Start Date:   03/14/20                          Expected End Date: 10/30/20                        - bring diary to all appointments - follow rescue plan if symptoms flare-up - know when to  call the doctor - dress right for the weather, hot or cold  Reinforced daily heart failure management skills limiting salt, weighing daily, taking medications as prescribed   Why is this important?   You will be able to handle your symptoms better if you keep track of them.  Making some simple changes to your lifestyle will help.  Eating healthy is one thing you can do to take good care of yourself. Notes:  Reinforced worsening symptoms to notify MD of, denies worsening of symptoms        Plan:  Follow-up:  Patient agrees to Care Plan and Follow-up.with telephone visit scheduled for the next 2 weeks, patient encouraged to contact me sooner if new concerns arise . Will send PCP this visit note .  Joylene Draft, RN, BSN  [place] Management Coordinator  [phone]- Mobile [phone]- Toll Free Main Office

## 2020-09-19 NOTE — Patient Instructions (Signed)
Diabetes Mellitus and Foot Care Foot care is an important part of your health, especially when you have diabetes. Diabetes may cause you to have problems because of poor blood flow (circulation) to your feet and legs, which can cause your skin to:  Become thinner and drier.  Break more easily.  Heal more slowly.  Peel and crack. You may also have nerve damage (neuropathy) in your legs and feet, causing decreased feeling in them. This means that you may not notice minor injuries to your feet that could lead to more serious problems. Noticing and addressing any potential problems early is the best way to prevent future foot problems. How to care for your feet Foot hygiene  Wash your feet daily with warm water and mild soap. Do not use hot water. Then, pat your feet and the areas between your toes until they are completely dry. Do not soak your feet as this can dry your skin.  Trim your toenails straight across. Do not dig under them or around the cuticle. File the edges of your nails with an emery board or nail file.  Apply a moisturizing lotion or petroleum jelly to the skin on your feet and to dry, brittle toenails. Use lotion that does not contain alcohol and is unscented. Do not apply lotion between your toes.   Shoes and socks  Wear clean socks or stockings every day. Make sure they are not too tight. Do not wear knee-high stockings since they may decrease blood flow to your legs.  Wear shoes that fit properly and have enough cushioning. Always look in your shoes before you put them on to be sure there are no objects inside.  To break in new shoes, wear them for just a few hours a day. This prevents injuries on your feet. Wounds, scrapes, corns, and calluses  Check your feet daily for blisters, cuts, bruises, sores, and redness. If you cannot see the bottom of your feet, use a mirror or ask someone for help.  Do not cut corns or calluses or try to remove them with medicine.  If you  find a minor scrape, cut, or break in the skin on your feet, keep it and the skin around it clean and dry. You may clean these areas with mild soap and water. Do not clean the area with peroxide, alcohol, or iodine.  If you have a wound, scrape, corn, or callus on your foot, look at it several times a day to make sure it is healing and not infected. Check for: ? Redness, swelling, or pain. ? Fluid or blood. ? Warmth. ? Pus or a bad smell.   General tips  Do not cross your legs. This may decrease blood flow to your feet.  Do not use heating pads or hot water bottles on your feet. They may burn your skin. If you have lost feeling in your feet or legs, you may not know this is happening until it is too late.  Protect your feet from hot and cold by wearing shoes, such as at the beach or on hot pavement.  Schedule a complete foot exam at least once a year (annually) or more often if you have foot problems. Report any cuts, sores, or bruises to your health care provider immediately. Where to find more information  American Diabetes Association: www.diabetes.org  Association of Diabetes Care & Education Specialists: www.diabeteseducator.org Contact a health care provider if:  You have a medical condition that increases your risk of infection and   you have any cuts, sores, or bruises on your feet.  You have an injury that is not healing.  You have redness on your legs or feet.  You feel burning or tingling in your legs or feet.  You have pain or cramps in your legs and feet.  Your legs or feet are numb.  Your feet always feel cold.  You have pain around any toenails. Get help right away if:  You have a wound, scrape, corn, or callus on your foot and: ? You have pain, swelling, or redness that gets worse. ? You have fluid or blood coming from the wound, scrape, corn, or callus. ? Your wound, scrape, corn, or callus feels warm to the touch. ? You have pus or a bad smell coming from  the wound, scrape, corn, or callus. ? You have a fever. ? You have a red line going up your leg. Summary  Check your feet every day for blisters, cuts, bruises, sores, and redness.  Apply a moisturizing lotion or petroleum jelly to the skin on your feet and to dry, brittle toenails.  Wear shoes that fit properly and have enough cushioning.  If you have foot problems, report any cuts, sores, or bruises to your health care provider immediately.  Schedule a complete foot exam at least once a year (annually) or more often if you have foot problems. This information is not intended to replace advice given to you by your health care provider. Make sure you discuss any questions you have with your health care provider. Document Revised: 12/09/2019 Document Reviewed: 12/09/2019 Elsevier Patient Education  2021 Elsevier Inc.  

## 2020-09-20 DIAGNOSIS — N185 Chronic kidney disease, stage 5: Secondary | ICD-10-CM | POA: Diagnosis not present

## 2020-09-20 DIAGNOSIS — G894 Chronic pain syndrome: Secondary | ICD-10-CM | POA: Diagnosis not present

## 2020-09-20 DIAGNOSIS — N186 End stage renal disease: Secondary | ICD-10-CM | POA: Diagnosis not present

## 2020-09-20 DIAGNOSIS — I120 Hypertensive chronic kidney disease with stage 5 chronic kidney disease or end stage renal disease: Secondary | ICD-10-CM | POA: Diagnosis not present

## 2020-09-20 DIAGNOSIS — J449 Chronic obstructive pulmonary disease, unspecified: Secondary | ICD-10-CM | POA: Diagnosis not present

## 2020-09-25 DIAGNOSIS — I4821 Permanent atrial fibrillation: Secondary | ICD-10-CM | POA: Diagnosis not present

## 2020-09-25 DIAGNOSIS — Z7901 Long term (current) use of anticoagulants: Secondary | ICD-10-CM | POA: Diagnosis not present

## 2020-09-26 ENCOUNTER — Ambulatory Visit: Payer: Medicare Other | Admitting: Pulmonary Disease

## 2020-09-26 ENCOUNTER — Telehealth: Payer: Self-pay | Admitting: *Deleted

## 2020-09-26 DIAGNOSIS — Z5181 Encounter for therapeutic drug level monitoring: Secondary | ICD-10-CM

## 2020-09-26 DIAGNOSIS — I4891 Unspecified atrial fibrillation: Secondary | ICD-10-CM

## 2020-09-26 NOTE — Telephone Encounter (Signed)
Pt called with INR result of 8.0.  States she has home monitor again but not sure it's working right.  Told pt to hold warfarin tonight (Monday) and recheck INR on Tuesday.  09/26/20  Called pt back to see if she had checked INR.today.  States machine won't work.  Offered pt INR appt in office or [facility] hospital.  States is would be easier for her to go to [facility]. States she can not get to hospital till Monday 5/2 against my advise.  Told pt to hold warfarin thru Thursday 4/28 and restart on Friday 4/29.  Lab order placed for Monday 5/2.  She verbalized understanding.

## 2020-09-27 ENCOUNTER — Other Ambulatory Visit: Payer: Self-pay | Admitting: *Deleted

## 2020-09-27 NOTE — Patient Outreach (Signed)
[facility]) Care Management  09/27/2020  Denise Freeman [facility] [birth year] [id number]    Care Coordination   Incoming call from Jarrett Soho, [facility] [employer], she reports that she continues to follow up with patient telephonically. She discussed patient reported having problems with her CPAP machine tubing didn't rest well last night due to not wearing machine. She reports awaiting new tubing to be sent via [employer]. Lorrin Goodell also discussed patient reports missing a doctor appointment on yesterday due to not feeling well, noted appointment with Dr. Halford Chessman missed. She also states patient awaiting visit from palliative care on today. Discussed with Alana that I will follow up with patient.   Placed call to [facility] palliative care, Serious illness program spoke with Colletta Maryland she confirms patient has home visit with NP scheduled for 4/28 between 12 and 1pm.  Placed call to Zayonna Akens no answer unable to leave a message no voicemail pickup.   Plan Will attempt return call in the next business day.    Joylene Draft, RN, BSN  [place] Management Coordinator  [phone]- Mobile [phone]- Toll Free Main Office

## 2020-09-28 ENCOUNTER — Telehealth: Payer: Self-pay | Admitting: *Deleted

## 2020-09-28 ENCOUNTER — Other Ambulatory Visit: Payer: Self-pay | Admitting: *Deleted

## 2020-09-28 DIAGNOSIS — Z515 Encounter for palliative care: Secondary | ICD-10-CM | POA: Diagnosis not present

## 2020-09-28 DIAGNOSIS — E876 Hypokalemia: Secondary | ICD-10-CM | POA: Diagnosis not present

## 2020-09-28 DIAGNOSIS — E211 Secondary hyperparathyroidism, not elsewhere classified: Secondary | ICD-10-CM | POA: Diagnosis not present

## 2020-09-28 DIAGNOSIS — J9611 Chronic respiratory failure with hypoxia: Secondary | ICD-10-CM | POA: Diagnosis not present

## 2020-09-28 DIAGNOSIS — N184 Chronic kidney disease, stage 4 (severe): Secondary | ICD-10-CM | POA: Diagnosis not present

## 2020-09-28 DIAGNOSIS — J9612 Chronic respiratory failure with hypercapnia: Secondary | ICD-10-CM | POA: Diagnosis not present

## 2020-09-28 DIAGNOSIS — J449 Chronic obstructive pulmonary disease, unspecified: Secondary | ICD-10-CM | POA: Diagnosis not present

## 2020-09-28 DIAGNOSIS — E559 Vitamin D deficiency, unspecified: Secondary | ICD-10-CM | POA: Diagnosis not present

## 2020-09-28 DIAGNOSIS — R809 Proteinuria, unspecified: Secondary | ICD-10-CM | POA: Diagnosis not present

## 2020-09-28 DIAGNOSIS — I129 Hypertensive chronic kidney disease with stage 1 through stage 4 chronic kidney disease, or unspecified chronic kidney disease: Secondary | ICD-10-CM | POA: Diagnosis not present

## 2020-09-28 DIAGNOSIS — I5042 Chronic combined systolic (congestive) and diastolic (congestive) heart failure: Secondary | ICD-10-CM | POA: Diagnosis not present

## 2020-09-28 NOTE — Patient Outreach (Signed)
[facility]) Care Management  09/28/2020  Haya Hemler [facility] 06/24/1950 [id number]   Care Coordination  Telephone assessment  Subjective Outreach call to patient to follow up on call received yesterday from Jarrett Soho, [place] paramedicine program that had telephone visit with patient on yesterday that patient expressed concerns regarding her home bipap tubing. She states patient has made contact with [facility] and awaiting new tubing.  Person answering phone identified as patient spouse Myrla Malanowski, he states that is now meeting with someone now  in the home, he thinks is  palliative care services will no disturb this visit.   Plan Will plan return call to patient has scheduled in next week.  Encouraged Patient to call sooner if new concerns  Joylene Draft, RN, BSN  [place] Management Coordinator  [phone]- Mobile [phone]- [place]

## 2020-09-28 NOTE — Telephone Encounter (Signed)
Returned call to pt, pt states she was advised to resume Warfarin today immediately by PCP. [place] INR company reported INR of 0.8 on Monday 4/25, when actually INR was 8.  Pt states her INR was 8.0 on the POC machine on Monday 09/25/20, and she spoke with Edrick Oh who manages her Warfarin on Monday.  Held Warfarin on Monday, and attempted to recheck on Tuesday.  POC machine would not work, therefor pt was instructed to hold Warfarin through Thursday 4/28 and resume previous dosage regimen on 4/29.  Pt states her PCP office told her to immediately resume so she didn't stroke or die, so pt did.  Advised pt it was ok that she took the Warfarin today, but obviously [facility] reported INR as 0.8 instead of 8.0.  Pt states the [place] is going to call her tomorrow and walk her through checking her INR and if the machine is still not working correctly they will come out and trouble shoot or replace POC machine.  Gave pt number to [facility] office to call INR if she is successful in getting, so that we can dose her INR result since Edrick Oh is out of the office for the rest of the week.  Pt verbalized understanding.  Edrick Oh has put in a lab order for INR at [facility] on Monday 10/02/20, which is the soonest the pt could make it there, if POC machine still is not functioning properly.

## 2020-09-28 NOTE — Telephone Encounter (Signed)
Patient called stating that her INR machine is not working properly. States that when she called in her INR to Dr. Juel Burrow office the nurse called her to tell her to go back on her coumadin. Patient is confused about taking her medication.

## 2020-10-02 ENCOUNTER — Other Ambulatory Visit: Payer: Self-pay | Admitting: *Deleted

## 2020-10-02 ENCOUNTER — Telehealth: Payer: Self-pay | Admitting: Cardiology

## 2020-10-02 NOTE — Telephone Encounter (Signed)
Called and spoke with pt.  States she does not fell like she can go to the lab to get her INR checked today since her BS has been so low.  Asked pt is she could go tomorrow.  States she doesn't know.  Pt has transportation issues to office and so far she has not been dependable with obtaining readings with home monitor. Message sent to Dr Harl Bowie to see about changing pt to Eliquis.  Awaiting response.

## 2020-10-02 NOTE — Telephone Encounter (Signed)
Patient wants Denise Freeman to call her - her blood sugar is down to 42 and she can not get it to come up

## 2020-10-02 NOTE — Patient Outreach (Signed)
[facility]) Care Management  [place]  10/02/2020   [name] [birth year] [id number]   Telephone follow up  Care Coordination    Subjective: Received missed call with  voicemail message from patient requesting return call. Placed return call to patient , no answer able to leave a HIPAA compliant voicemail message for return call.    Care Coordination Call Placed call to [facility] Serious Illness/Palliative care program,spoke with Colletta Maryland. She reports that patient had visit from Palliative Care , NP on 4/28 and patient has been enrolled into palliative care program.  She discussed next palliative follow up in 28 days, she states patient has contact to reach sooner if needed.   Plan If no return call from patient today, will keep scheduled telephone outreach call on 10/03/20.   Joylene Draft, RN, BSN  [place] Management Coordinator  [phone]- Mobile [phone]- Toll Free Main Office

## 2020-10-03 ENCOUNTER — Ambulatory Visit (HOSPITAL_COMMUNITY): Admission: RE | Admit: 2020-10-03 | Payer: Medicare Other | Source: Ambulatory Visit

## 2020-10-03 ENCOUNTER — Other Ambulatory Visit: Payer: Self-pay | Admitting: *Deleted

## 2020-10-03 NOTE — Patient Outreach (Signed)
[facility]) Care Management  [facility] Care Manager  10/03/2020   Denise Freeman 1950/11/07 [id number]   Telephone Assessment   Patient is a 70 year old female with PMHX: Combined Systolic and Diastolic Heart Failure, Chronic hypoxic respiratory failure, chronic oxygen therapyat 3 litersOSA with Bipap, Chronic pain syndrome, hypertension,Chronic Atrial Fib,Diabetes( A1c 13.9% on 6/2/21per [facility])Depression with anxiety, partially blind both eyes, hard of hearing  Subjective:  Successful outreach call to patient she reports doing fair on today.  Patient began to discuss recent concerns: Diabetes, she reports low reading on yesterday , she states when her blood sugar gets down to 150's she starts to having shaking spells. She reports reading this morning 154 denies having reading less than 100. She reports taking Basalar only once daily at night 20 units. She reports monitoring blood sugars before meals and after at times and does not take humalog is blood sugar less than 100. She discussed receiving error message on freestyle libre monitor and having to wait 12 hours before she can scan again , so she is using her freestyle finger stick meter. She reports placing call to company that supplies her meter requesting a new meter. She reports placing call to [name] , NP to discuss concerns regarding order for new meter as well as discussing low blood sugar reading awaiting return call. Noted Hemogoblin A1c on 08/10/20 was 8.5% down from 12.7% on 04/24/20. COPD Patient discussed recent concerns with her bipap tubing and she just received new tubing delivery on today.  Patient missed scheduled appointment with Dr. Scherry Ran she may have forgot. Patient discussed it takes a lot of energy getting ready and sometimes she just can't make appointments Heart [facility] She discussed weight range staying in 170-171 range denies worsening symptoms of swelling or sudden weight  gain. INR Patient discussed having some  difficulties with using meter to check reading at home, even after recent visit from company to reeducate her on use, she is concern regarding recent results. She reports awaiting a call from Cardiology office regarding a change in medication due to monitoring required with coumadin  and difficulty with use of monitor and getting to appointments at times.  Transportation/Missed medical appointments Patient states that she has transportation to appointments but depending on the day she just can't make it, if it is a rainy day she can't go. She discussed it takes all of her energy getting to the car. She describes using her power chair to get to the car, she discussed having several oxygen tanks on hand in the car for back up as needed. She discussed wanting a motor chair that she can break down in have in the car. Discussed with patient possible wheelchair accessible  Transportation that may be available, she is hesitant stating you have to wait for them to come an pick you up, also concern of carrying extra oxygen tank cylinders in case needed. Palliative Care /Serious Illness program [place] Patient is enrolled in Palliative care program, anticipates next visit in May.      Objective:   Encounter Medications:  Outpatient Encounter Medications as of 10/03/2020  Medication Sig Note  . albuterol (PROVENTIL) (2.5 MG/3ML) 0.083% nebulizer solution Take 3 mLs (2.5 mg total) by nebulization every 6 (six) hours as needed for wheezing or shortness of breath.   . allopurinol (ZYLOPRIM) 100 MG tablet TAKE 1 TABLET BY MOUTH ONCE DAILY. (Patient taking differently: Take 100 mg by mouth daily.)   . atorvastatin (LIPITOR) 20  MG tablet Take 1 tablet (20 mg total) by mouth daily. (Patient not taking: Reported on 09/05/2020) 09/05/2020: States she never got it.   . budesonide (PULMICORT) 0.5 MG/2ML nebulizer solution Take 2 mLs (0.5 mg total) by nebulization 2 (two)  times daily.   . cetirizine (ZYRTEC) 10 MG tablet Take 10 mg by mouth daily.   . cholecalciferol (VITAMIN D) 400 units TABS tablet Take 1 tablet (400 Units total) by mouth 2 (two) times daily.   . clobetasol cream (TEMOVATE) 4.40 % Apply 1 application topically daily.    . colchicine 0.6 MG tablet Take 0.6 mg by mouth daily as needed. gout   . FEROSUL 325 (65 Fe) MG tablet Take 325 mg by mouth daily.    . fluticasone (FLONASE) 50 MCG/ACT nasal spray Place 1 spray into both nostrils 2 (two) times daily.   . furosemide (LASIX) 40 MG tablet TAKE 3 TABLETS BY MOUTH TWICE DAILY   . Insulin Glargine (BASAGLAR KWIKPEN) 100 UNIT/ML Inject 20 Units into the skin 2 (two) times daily. (Patient taking differently: No sig reported) 10/03/2020: Reports taking 20 units at hs onlly   . insulin lispro (HUMALOG) 100 UNIT/ML KiwkPen Inject 0.02 mLs (2 Units total) into the skin 3 (three) times daily before meals.   Marland Kitchen ipratropium (ATROVENT) 0.02 % nebulizer solution Take 2.5 mLs (0.5 mg total) by nebulization every 6 (six) hours as needed for wheezing or shortness of breath.   . metoprolol succinate (TOPROL XL) 25 MG 24 hr tablet Take 1.5 tablets (37.5 mg total) by mouth daily.   . mirtazapine (REMERON) 15 MG tablet Take 1 tablet (15 mg total) by mouth at bedtime. 03/22/2020: LF: 03/08/2020 DS:28   . modafinil (PROVIGIL) 200 MG tablet Take 1 tablet (200 mg total) by mouth daily.   . montelukast (SINGULAIR) 10 MG tablet Take 1 tablet (10 mg total) by mouth at bedtime. 03/22/2020: LF: 03/08/2020 DS:28   . oxyCODONE-acetaminophen (PERCOCET/ROXICET) 5-325 MG tablet Take 1 tablet by mouth 2 (two) times daily as needed for severe pain. 03/22/2020: LF:03/01/2020 DS:30   . pantoprazole (PROTONIX) 20 MG tablet TAKE (1) TABLET BY MOUTH TWICE A DAY BEFORE MEALS. (BREAKFAST AND SUPPER) (Patient taking differently: Take 20 mg by mouth 2 (two) times daily before a meal.) 03/22/2020: LF: 03/08/2020 DS:28   . primidone (MYSOLINE) 50 MG  tablet TAKE ONE TABLET BY MOUTH AT BEDTIME. (Patient taking differently: Take 50 mg by mouth at bedtime.) 03/22/2020: LF: 03/08/2020 DS:28   . Probiotic Product (PROBIOTIC PO) Take 1 tablet by mouth at bedtime.   . revefenacin (YUPELRI) 175 MCG/3ML nebulizer solution Take 175 mcg by nebulization daily.   . TRUE METRIX BLOOD GLUCOSE TEST test strip USE TO TEST BLOOD SUGAR AS DIRECTED.   . Vitamin A 2400 MCG (8000 UT) CAPS Take 8,000 Units by mouth every morning.  03/22/2020: LF: 03/08/2020 DS: 28  . Vitamin D, Ergocalciferol, (DRISDOL) 1.25 MG (50000 UNIT) CAPS capsule Take 1 capsule (50,000 Units total) by mouth every 7 (seven) days.   Marland Kitchen warfarin (COUMADIN) 4 MG tablet TAKE 1 TABLET BY MOUTH ONCE A DAY OR AS DIRECTED BY THE COUMADIN CLINIC (Patient taking differently: TAKE 1 TABLET BY MOUTH ONCE A DAY OR AS DIRECTED - managed by pcp)   . [DISCONTINUED] potassium chloride 20 MEQ TBCR Take 20 mEq by mouth 2 (two) times daily.    No facility-administered encounter medications on file as of 10/03/2020.    Functional Status:  In your present state of health,  do you have any difficulty performing the following activities: 03/23/2020 03/20/2020  Hearing? [place]? Y -  Difficulty concentrating or making decisions? N -  Walking or climbing stairs? Y -  Comment - -  Dressing or bathing? Y -  Comment - -  Doing errands, shopping? [place] and eating ? - -  Comment - -  Using the Toilet? - -  In the past six months, have you accidently leaked urine? - -  Do you have problems with loss of bowel control? - -  Managing your Medications? - -  Managing your Finances? - -  Comment - -  Housekeeping or managing your Housekeeping? - -  Comment - -  Some recent data might be hidden    Fall/Depression Screening: Fall Risk  02/01/2020 06/11/2019 05/17/2019  Falls in the past year? 1 1 1   Number falls in past yr: 0 1 1  Injury with Fall? 0 1 1  Comment - - -  Risk Factor  Category  - - -  Comment - - -  Risk for fall due to : History of fall(s);Impaired balance/gait;Impaired vision History of fall(s) History of fall(s);Impaired vision;Impaired mobility  Risk for fall due to: Comment - - -  Follow up Falls prevention discussed Falls evaluation completed;Education provided Education provided;Falls prevention discussed   PHQ 2/9 Scores 09/19/2020 05/17/2019 11/28/2017 11/27/2017 10/03/2017 09/15/2017 09/05/2017  PHQ - 2 Score 1 0 0 5 6 0 0  PHQ- 9 Score - - - - 23 - -    Assessment: Patient will benefit from ongoing education and support with managing chronic conditions.  Goals Addressed            This Visit's Progress   . Find Help in My Community   On track    Timeframe:  Short-Term Goal Priority:  Medium Start Date:  09/11/20                           Expected End Date:   10/30/20                    Follow Up Date 10/30/20   - follow-up on any referrals for help I am given    Why is this important?    Knowing how and where to find help for yourself or family in your neighborhood and community is an important skill.   You will want to take some steps to learn how.    Notes:  Verified patient is enrolled in Serious Illness palliative care program , [facility], discussed next visit with NP in may. Patient also contacting Education officer, museum in program .     . Make and Keep All Appointments   Not on track    Follow Up Date 10/30/20 Timeframe:  Long-Range Goal Priority:  High Start Date:    03/29/20                         Expected End Date: 11/30/20                    - call to cancel if needed - keep a calendar with appointment dates Reschedule missed appointments  reinforced    Why is this important?   Part of staying healthy is seeing the doctor for follow-up care.  If you forget your appointments, there are some  things you can do to stay on track.    Notes:  Praised for attending appointment to reestablish with prior PCP and attending  Nephrology visit . Discussed missed Pulmonary , cardiology visits. Discussed alternative transportation to appointments, wheelchair accessible available to assist with follow up. Stressed importance of follow up. Offer to assist with rescheduling appt.     . Manage My Medicine   On track    Follow Up Date 10/30/20 Timeframe:  Long-Range Goal Priority:  High Start Date:     03/14/20                       Expected End Date:  11/30/20                      - call for medicine refill 2 or 3 days before it runs out  - take medications as prescribed per most recent after visit summary   Why is this important?   These steps will help you keep on track with your medicines.    Notes:  Discussed patient having all medication on hand . Reinforced taking as prescribed and notifying MD sooner of new concerns related to blood sugar, weight and blood pressure     . Monitor and Manage My Blood Sugar   On track    Follow Up Date 10/30/20 Timeframe:  Long-Range Goal Priority:  High Start Date:  03/14/20                           Expected End Date:  10/30/20                       - check blood sugar at prescribed times - check blood sugar if I feel it is too high or too low - take the blood sugar meter to all doctor visits  Replace libre sensor to monitor blood sugars as recommended and with assistance of office as needed    Why is this important?   Checking your blood sugar at home helps to keep it from getting very high or very low.  Writing the results in a diary or log helps the doctor know how to care for you.  Your blood sugar log should have the time, date and the results.  Also, write down the amount of insulin or other medicine that you take.  Other information, like what you ate, exercise done and how you were feeling, will also be helpful.     Notes: Reviewed recent blood sugars, this am 154 on this am report low reading yesterday, . Reports hypoglycemia symptoms if blood sugar less than  150. Reviewed when to notify MD of concerns related to low/high blood sugars.      Illa Level Eye Exam   Not on track    Follow Up Date 10/30/20 Timeframe:  Long-Range Goal Priority:  Medium Start Date:   1012/21                          Expected End Date:    11/30/20                  - keep appointment with eye doctor  Rescheduled missed appointments  encouraged    Why is this important?   Eye check-ups are important when you have diabetes.  Vision loss can be prevented.  Notes:  Encouraged importance of  annual eye exam to identify problems sooner to address.     . Perform Foot Care   On track    Follow Up Date 10/30/20 Timeframe:  Long-Range Goal Priority:  Medium Start Date:  03/14/20                           Expected End Date:  10/30/20                     - check feet daily for cuts, sores or redness - wash and dry feet carefully every day - wear comfortable, cotton socks - wear comfortable, well-fitting shoes    Why is this important?   Good foot care is very important when you have diabetes.  There are many things you can do to keep your feet healthy and catch a problem early.    Notes:  Again encourage  spouse to assist with observance of feet and with care , will send education hand out foot care , reviewed measures. Encouraged to make sure provider looks at her foot at visit on 4/20 to concern of her toe after she hit it and complains of nailbed being bruised     . Set My Target A1C   On track    Follow Up Date: 09/30/20 Timeframe:  Long-Range Goal Priority:  Medium Start Date:    03/14/20                         Expected End Date:   10/30/20                    - set target A1C    Why is this important?   Your target A1C is decided together by you and your doctor.  It is based on several things like your age and other health issues.    Notes:  Reviewed recent improvement with Decrease in A1c to 8.5% from 12 range, congratulated on progress    . Track and  Manage Activity and Exertion   Not on track    Follow Up Date: 10/04/20 Timeframe:  Long-Range Goal Priority:  Medium Start Date:   03/14/20                          Expected End Date: 10/30/20                        - pace activity allowing for rest     Why is this important?   Exercising is very important when managing your heart failure.  It will help your heart get stronger.    Notes:  Fall safety review use of walker, bedside commode. Discuss daily chair exercises to increase strength and help with balance.     . Track and Manage Fluids and Swelling   On track    Follow Up Date 10/04/20 Timeframe:  Long-Range Goal Priority:  Medium Start Date:     03/14/20                        Expected End Date:   10/30/20                  - call office if I gain more than 2 pounds in one day or 5 pounds in  one week - do ankle pumps when sitting - keep legs up while sitting - track weight in diary - watch for swelling in feet, ankles and legs every day - weigh myself daily  Reinforced action plan for yellow zone symptoms .   Why is this important?   It is important to check your weight daily and watch how much salt and liquids you have.  It will help you to manage your heart failure.    Notes:Reinforced continuing daily weight monitoring and taking record at MD visits review of Heart failure  Action plan , reinforced low sodium diet, hidden salt in food that can contribute to fluid weight.  Patient discussed rinsing canned vegetables and using low salt soups again reinforced salt contents.     . Track and Manage Symptoms   On track    Follow Up Date 10/30/20 Timeframe:  Long-Range Goal Priority:  Medium Start Date:   03/14/20                          Expected End Date: 10/30/20                        - bring diary to all appointments - follow rescue plan if symptoms flare-up - know when to call the doctor - dress right for the weather, hot or cold  Reinforced daily heart failure  management skills limiting salt, weighing daily, taking medications as prescribed   Why is this important?   You will be able to handle your symptoms better if you keep track of them.  Making some simple changes to your lifestyle will help.  Eating healthy is one thing you can do to take good care of yourself. Notes: Reinforced worsening symptoms to notify MD of, denies worsening of symptoms        Plan:  Follow-up:  Patient agrees to Care Plan and Follow-up.Will plan follow up call in the next week. Will send EMMI handout on Low blood sugar and Diabetes ABC's.   Joylene Draft, RN, BSN  [place] Management Coordinator  [phone]- Mobile [phone]- Toll Free Main Office

## 2020-10-04 ENCOUNTER — Ambulatory Visit: Payer: Medicare Other | Admitting: *Deleted

## 2020-10-04 MED ORDER — ELIQUIS 5 MG PO TABS
5.0000 mg | ORAL_TABLET | Freq: Two times a day (BID) | ORAL | 6 refills | Status: AC
Start: 2020-10-04 — End: ?

## 2020-10-04 NOTE — Telephone Encounter (Signed)
Took last dose of warfarin 10/03/20.  Start Eliquis 5mg  twice daily on 10/07/20.  Rx sent to [facility].    Dx:   Atrial fib Age:  70 Wt:  77.1kg SCr 1.84 on 08/10/20  Patient told and she verbalized understanding.

## 2020-10-04 NOTE — Telephone Encounter (Signed)
Dr Harl Bowie, Please advise. Lattie Haw

## 2020-10-04 NOTE — Telephone Encounter (Signed)
Ok to change to eliquis. Would stop coumadin, after 3 days start eliquis 5mg  bid. Cc'd Staci as well in case need help placing order   J Kjuan Seipp MD

## 2020-10-11 ENCOUNTER — Ambulatory Visit: Payer: Self-pay | Admitting: *Deleted

## 2020-10-11 ENCOUNTER — Other Ambulatory Visit: Payer: Medicare Other

## 2020-10-12 ENCOUNTER — Other Ambulatory Visit: Payer: Self-pay | Admitting: *Deleted

## 2020-10-12 NOTE — Patient Outreach (Signed)
[facility]) Care Management  [place]  10/12/2020   EMMALINA ESPERICUETA [birth year] [id number]     Telephone Assessment    Patient is a 70 year old female with PMHX: Combined Systolic and Diastolic Heart Failure, Chronic hypoxic respiratory failure, chronic oxygen therapyat 3 litersOSA with Bipap, Chronic pain syndrome, hypertension,Chronic Atrial Fib,Diabetes( A1c 13.9% on 6/2/21per [facility])Depression with anxiety, partially blind both eyes, hard of hearing  Outreach call  Subjective: Unsuccessful scheduled outreach call to patient , no answer able to leave a HIPAA compliant voicemail message for return call.   Plan Will plan return call in the next 2 weeks for continued care management assessment of ongoing care needs and patient PCP provider now with [facility].     Joylene Draft, RN, BSN  [place] Management Coordinator  [phone]- Mobile [phone]- Toll Free Main Office

## 2020-10-13 ENCOUNTER — Other Ambulatory Visit: Payer: Self-pay | Admitting: *Deleted

## 2020-10-13 NOTE — Patient Outreach (Signed)
[facility]) Care Management  10/13/2020  Denise Freeman [facility] 02-16-1951 [id number]   Telephone Assessment    Noted missed phone call from patient contact number  no voicemail message left. Unsuccessful attempt to return call to patient , no answer.      Plan Will continue plan scheduled call .    Joylene Draft, RN, BSN  [place] Management Coordinator  [phone]- Mobile [phone]- Toll Free Main Office

## 2020-10-19 ENCOUNTER — Telehealth: Payer: Self-pay | Admitting: Cardiology

## 2020-10-19 ENCOUNTER — Other Ambulatory Visit: Payer: Medicare Other

## 2020-10-19 NOTE — Telephone Encounter (Signed)
Patient calling to cancel the echo for this evening - stated she is so SOB that she can barely go a few steps without giving out of breath.  Stated that she has placed a call to her pcp & is waiting on a call back to her.  Encouraged her to keep the Echo visit if at all possible due to the symptoms she is having.  She states that her husband is having back pain & can't be pulling on her or coming out.  Suggested that she may need to be evaluated in the ED if her symptoms are that bad.  Patient insisted that she had to wait for her pcp to call her back for them to tell her what to do.

## 2020-10-19 NOTE — Telephone Encounter (Signed)
New message    Pt c/o Shortness Of Breath: STAT if SOB developed within the last 24 hours or pt is noticeably SOB on the phone  1. Are you currently SOB (can you hear that pt is SOB on the phone)? yes  2. How long have you been experiencing SOB? Awhile   3. Are you SOB when sitting or when up moving around? both  4. Are you currently experiencing any other symptoms? Patient states she is having a lot of swelling , she wants to cancel her echo for today if you think this is ok ?

## 2020-10-24 ENCOUNTER — Other Ambulatory Visit: Payer: Self-pay | Admitting: *Deleted

## 2020-10-24 NOTE — Patient Outreach (Signed)
[facility]) Care Management  [place]  10/24/2020   Denise Freeman [birth year] [id number]   Telephone Assessment    Patient is a 70 year old female with PMHX: Combined Systolic and Diastolic Heart Failure, Chronic hypoxic respiratory failure, chronic oxygen therapyat 3 litersOSA with Bipap, Chronic pain syndrome, hypertension,Chronic Atrial Fib,Diabetes( A1c 8.5% ib 08/10/20 ) Depression with anxiety, partially blind both eyes, hard of hearing  Subjective:  Unsuccessful outreach call to patient , no answer able to leave a HIPAA compliant voicemail message for return call.   Care Coordination Note Outreach call to Serious illness program/Palliative care [facility]. Spoke with Colletta Maryland to inquire regarding patient next home visit with NP, she identifies visit scheduled for 5/26. Spoke with [name] , NP that is following patient, discussed concern regarding patient frequency of cancelling scheduled ,cardiology/echo, pulmonary appointments  with noted complaints of increase in shortness of breath . Discussed prior conversation with patient that she does have a scooter but is states just getting ready and getting to car just wears her out, and depending on the how she feels that day she has to cancel appointments.    Plan Will plan follow up outreach call to patient in the next week, and collaboration with Serious illness program after their home visit.    Joylene Draft, RN, BSN  [place] Management Coordinator  [phone]- Mobile [phone]- Toll Free Main Office

## 2020-10-25 ENCOUNTER — Telehealth: Payer: Self-pay | Admitting: Cardiology

## 2020-10-25 ENCOUNTER — Other Ambulatory Visit: Payer: Self-pay | Admitting: *Deleted

## 2020-10-25 DIAGNOSIS — I509 Heart failure, unspecified: Secondary | ICD-10-CM

## 2020-10-25 DIAGNOSIS — J441 Chronic obstructive pulmonary disease with (acute) exacerbation: Secondary | ICD-10-CM

## 2020-10-25 DIAGNOSIS — E1169 Type 2 diabetes mellitus with other specified complication: Secondary | ICD-10-CM

## 2020-10-25 NOTE — Telephone Encounter (Signed)
Spoke with [name] w/[facility]. Medication profile reviewed and updated with adding metolazone 1.25 mg 3 times weekly that was restarted by PCP. Advised that echocardiogram ordered in early April 2022 has not been done d/t patient constantly calling to cancel/reschedule. Advised that nephrologist should also be contacted with symptoms since kidney disease could also be contributing. Advised to continue daily weights, same time, same scale and same amount of clothes, contacting our office for 2-3 lb weight gain in 24 hours or 5 lbs in one week. Per [name], NP with [facility] will be seeing patient tomorrow and will call office with update.

## 2020-10-25 NOTE — Patient Outreach (Signed)
[facility]) Care Management  [place]  10/25/2020   Denise Freeman 29-Mar-1951 [id number]  Subjective:  Incoming call from patient ( unsuccessful call to patient on yesterday).  Patient states breathing is not good due increase in fluid, she states she just can't get the fluid off. She states that her weight has crept up, to 195 at one point. She reports today's weight 188, prior 2 days 189- 193. She states that she is limiting how much she is drinking about 2 8oz bottles of water and a 12 oz diet drink a day. She reports that her blood pressure is staying good 112-118/70, HR 90, Oxygen at 2 liters 93% . She continues to wear her Bipap. Patient states that [name] NP changed her metolozone to 1/2 tablet 3 times a week at visit in April . She reports still taking Furosemide 40 mg , 3 tablets twice daily, she voiced concern of swelling in lower legs and fluid not coming off.  She discussed difficulty with getting to appointments states she can't rush  and go if it's raining, too hot, too cool and depending on how her husband is doing as he has back problems.  Discussed transportation service that she may be  able to travel by her wheelchair, patient is agreeable . Discussed with patient communication with Serious Illness Palliative program, and NP scheduled to visit on tomorrow . Patient discussed having fall about 4 days ago reports having a bruise to right hip area, reports bruised area lightening in color.   Placed call to Cardiology office to communicate concerns related fluid, weight gain. Spoke with nurse Alma Friendly to discuss patient concerns related to fluid weight gain,concern regarding cancelling appointments. Reviewed patient has home visit with with NP from Serious Illness/Palliative care on tomorrow and has communicates concerns with her.  Alma Friendly reinforced patient need to have Echo cardiogram to help with treatment plan. Discussed plan for wheelchair  accessible transportation to hopefully ease burden for patient easily wearing out with getting into to car.    Objective:   Encounter Medications:  Outpatient Encounter Medications as of 10/25/2020  Medication Sig Note  . albuterol (PROVENTIL) (2.5 MG/3ML) 0.083% nebulizer solution Take 3 mLs (2.5 mg total) by nebulization every 6 (six) hours as needed for wheezing or shortness of breath.   . allopurinol (ZYLOPRIM) 100 MG tablet TAKE 1 TABLET BY MOUTH ONCE DAILY. (Patient taking differently: Take 100 mg by mouth daily.)   . apixaban (ELIQUIS) 5 MG TABS tablet Take 1 tablet (5 mg total) by mouth 2 (two) times daily.   Marland Kitchen atorvastatin (LIPITOR) 20 MG tablet Take 1 tablet (20 mg total) by mouth daily. (Patient not taking: Reported on 09/05/2020) 09/05/2020: States she never got it.   . budesonide (PULMICORT) 0.5 MG/2ML nebulizer solution Take 2 mLs (0.5 mg total) by nebulization 2 (two) times daily.   . cetirizine (ZYRTEC) 10 MG tablet Take 10 mg by mouth daily.   . cholecalciferol (VITAMIN D) 400 units TABS tablet Take 1 tablet (400 Units total) by mouth 2 (two) times daily.   . clobetasol cream (TEMOVATE) 4.13 % Apply 1 application topically daily.    . colchicine 0.6 MG tablet Take 0.6 mg by mouth daily as needed. gout   . FEROSUL 325 (65 Fe) MG tablet Take 325 mg by mouth daily.    . fluticasone (FLONASE) 50 MCG/ACT nasal spray Place 1 spray into both nostrils 2 (two) times daily.   . furosemide (LASIX) 40 MG  tablet TAKE 3 TABLETS BY MOUTH TWICE DAILY   . Insulin Glargine (BASAGLAR KWIKPEN) 100 UNIT/ML Inject 20 Units into the skin 2 (two) times daily. (Patient taking differently: No sig reported) 10/03/2020: Reports taking 20 units at hs onlly   . insulin lispro (HUMALOG) 100 UNIT/ML KiwkPen Inject 0.02 mLs (2 Units total) into the skin 3 (three) times daily before meals.   Marland Kitchen ipratropium (ATROVENT) 0.02 % nebulizer solution Take 2.5 mLs (0.5 mg total) by nebulization every 6 (six) hours as needed for  wheezing or shortness of breath.   . metolazone (ZAROXOLYN) 2.5 MG tablet Take 1.25 mg by mouth 3 (three) times a week. 10/25/2020: Restarted by [name], NP  . metoprolol succinate (TOPROL XL) 25 MG 24 hr tablet Take 1.5 tablets (37.5 mg total) by mouth daily.   . mirtazapine (REMERON) 15 MG tablet Take 1 tablet (15 mg total) by mouth at bedtime. 03/22/2020: LF: 03/08/2020 DS:28   . modafinil (PROVIGIL) 200 MG tablet Take 1 tablet (200 mg total) by mouth daily.   . montelukast (SINGULAIR) 10 MG tablet Take 1 tablet (10 mg total) by mouth at bedtime. 03/22/2020: LF: 03/08/2020 DS:28   . oxyCODONE-acetaminophen (PERCOCET/ROXICET) 5-325 MG tablet Take 1 tablet by mouth 2 (two) times daily as needed for severe pain. 03/22/2020: LF:03/01/2020 DS:30   . pantoprazole (PROTONIX) 20 MG tablet TAKE (1) TABLET BY MOUTH TWICE A DAY BEFORE MEALS. (BREAKFAST AND SUPPER) (Patient taking differently: Take 20 mg by mouth 2 (two) times daily before a meal.) 03/22/2020: LF: 03/08/2020 DS:28   . primidone (MYSOLINE) 50 MG tablet TAKE ONE TABLET BY MOUTH AT BEDTIME. (Patient taking differently: Take 50 mg by mouth at bedtime.) 03/22/2020: LF: 03/08/2020 DS:28   . Probiotic Product (PROBIOTIC PO) Take 1 tablet by mouth at bedtime.   . revefenacin (YUPELRI) 175 MCG/3ML nebulizer solution Take 175 mcg by nebulization daily.   . TRUE METRIX BLOOD GLUCOSE TEST test strip USE TO TEST BLOOD SUGAR AS DIRECTED.   . Vitamin A 2400 MCG (8000 UT) CAPS Take 8,000 Units by mouth every morning.  03/22/2020: LF: 03/08/2020 DS: 28  . Vitamin D, Ergocalciferol, (DRISDOL) 1.25 MG (50000 UNIT) CAPS capsule Take 1 capsule (50,000 Units total) by mouth every 7 (seven) days.   . [DISCONTINUED] potassium chloride 20 MEQ TBCR Take 20 mEq by mouth 2 (two) times daily.    No facility-administered encounter medications on file as of 10/25/2020.    Functional Status:  In your present state of health, do you have any difficulty performing  the following activities: 03/23/2020 03/20/2020  Hearing? [place]? Y -  Difficulty concentrating or making decisions? N -  Walking or climbing stairs? Y -  Comment - -  Dressing or bathing? Y -  Comment - -  Doing errands, shopping? [place] and eating ? - -  Comment - -  Using the Toilet? - -  In the past six months, have you accidently leaked urine? - -  Do you have problems with loss of bowel control? - -  Managing your Medications? - -  Managing your Finances? - -  Comment - -  Housekeeping or managing your Housekeeping? - -  Comment - -  Some recent data might be hidden    Fall/Depression Screening: Fall Risk  02/01/2020 06/11/2019 05/17/2019  Falls in the past year? 1 1 1   Number falls in past yr: 0 1 1  Injury with Fall? 0  1 1  Comment - - -  Risk Factor Category  - - -  Comment - - -  Risk for fall due to : History of fall(s);Impaired balance/gait;Impaired vision History of fall(s) History of fall(s);Impaired vision;Impaired mobility  Risk for fall due to: Comment - - -  Follow up Falls prevention discussed Falls evaluation completed;Education provided Education provided;Falls prevention discussed   PHQ 2/9 Scores 09/19/2020 05/17/2019 11/28/2017 11/27/2017 10/03/2017 09/15/2017 09/05/2017  PHQ - 2 Score 1 0 0 5 6 0 0  PHQ- 9 Score - - - - 23 - -    Assessment:  Goals Addressed            This Visit's Progress   . Find Help in My Community   On track    Timeframe:  Short-Term Goal Priority:  Medium Start Date:  09/11/20                           Expected End Date:   10/30/20                    Follow Up Date 11/30/20   - follow-up on any referrals for help I am given    Why is this important?    Knowing how and where to find help for yourself or family in your neighborhood and community is an important skill.   You will want to take some steps to learn how.    Barriers: Knowledge  Notes:  Discussed follow up with Serious illness  program and plan for visit on 10/26/20. Discussed referral for transportation for wheelchair accessible transportation     . Make and Keep All Appointments   Not on track    Follow Up Date 10/30/20 Timeframe:  Long-Range Goal Priority:  High Start Date:    03/29/20                         Expected End Date: 12/30/20                    - call to cancel if needed - keep a calendar with appointment dates Reschedule missed appointments  reinforced    Why is this important?   Part of staying healthy is seeing the doctor for follow-up care.  If you forget your appointments, there are some things you can do to stay on track.    Barriers: Health Behaviors Transportation  Notes: Cancel recent echo appointment again, discussed barrier to getting to appointment. Urged importance of keeping all appointments. She is agreeable to assistance with wheelchair accessible transportation. Patient agreeable to rescheduling Echo appointment.      . Manage My Medicine   On track    Follow Up Date 10/30/20 Timeframe:  Long-Range Goal Priority:  High Start Date:     03/14/20                       Expected End Date:  12/30/20                    - call for medicine refill 2 or 3 days before it runs out  - take medications as prescribed per most recent after visit summary   Why is this important?   These steps will help you keep on track with your medicines.    Barriers: None  Notes:  Patient is aware of  daily medication list and adhering     . Monitor and Manage My Blood Sugar   On track    Follow Up Date 10/30/20 Timeframe:  Long-Range Goal Priority:  High Start Date:  03/14/20                           Expected End Date:  12/30/20                       - check blood sugar at prescribed times - check blood sugar if I feel it is too high or too low - take the blood sugar meter to all doctor visits  Replace libre sensor to monitor blood sugars as recommended and with assistance of office as needed     Why is this important?   Checking your blood sugar at home helps to keep it from getting very high or very low.  Writing the results in a diary or log helps the doctor know how to care for you.  Your blood sugar log should have the time, date and the results.  Also, write down the amount of insulin or other medicine that you take.  Other information, like what you ate, exercise done and how you were feeling, will also be helpful.  Barriers: Health Behaviors Knowledge     Notes: Commended on continued monitoring of blood sugar and keeping a log. Stressed notifying MD sooner if concerns related to hypoglycemia symptoms     . Obtain Eye Exam   Not on track    Follow Up Date 10/30/20 Timeframe:  Long-Range Goal Priority:  Medium Start Date:   1012/21                          Expected End Date:    12/30/20                - keep appointment with eye doctor  Rescheduled missed appointments  encouraged    Why is this important?   Eye check-ups are important when you have diabetes.  Vision loss can be prevented.   Barriers: Health Behaviors  Notes:  Encouraged importance of  annual eye exam to identify problems sooner to address.     . COMPLETED: Perform Foot Care       Follow Up Date 10/30/20 Timeframe:  Long-Range Goal Priority:  Medium Start Date:  03/14/20                           Expected End Date:  10/30/20                     - check feet daily for cuts, sores or redness - wash and dry feet carefully every day - wear comfortable, cotton socks - wear comfortable, well-fitting shoes    Why is this important?   Good foot care is very important when you have diabetes.  There are many things you can do to keep your feet healthy and catch a problem early.    Notes:  Again encourage  spouse to assist with observance of feet and with care , will send education hand out foot care , reviewed measures. Encouraged to make sure provider looks at her foot at visit on 4/20 to concern  of her toe after she hit it and complains of nailbed being bruised     .  Set My Target A1C   On track    Follow Up Date: 09/30/20 Timeframe:  Long-Range Goal Priority:  Medium Start Date:    03/14/20                         Expected End Date:   11/30/20                  - set target A1C    Why is this important?   Your target A1C is decided together by you and your doctor.  It is based on several things like your age and other health issues.    Barriers: Knowledge  Notes:  Reviewed recent improvement with Decrease in A1c to 8.5% from 12 range, congratulated on progress    . track and manage activity and exertion   On track    Follow Up Date: 10/04/20 Timeframe:  Long-Range Goal Priority:  Medium Start Date:   03/14/20                          Expected End Date: 7/30/ 22                       - pace activity allowing for rest     Why is this important?   Exercising is very important when managing your heart failure.  It will help your heart get stronger.    Notes:  Discuss recent fall and injury, fall precaution reviewed . Reinforced notifying MD of concern for injury or no improvement with bruising .     Marland Kitchen Track and Manage Fluids and Swelling   On track    Follow Up Date 10/04/20 Timeframe:  Long-Range Goal Priority:  Medium Start Date:     03/14/20                        Expected End Date:   12/30/20                 - call office if I gain more than 2 pounds in one day or 5 pounds in one week - do ankle pumps when sitting - keep legs up while sitting - track weight in diary - watch for swelling in feet, ankles and legs every day - weigh myself daily  Reinforced action plan for yellow zone symptoms .  Barriers: Health Behaviors Knowledge  Why is this important?   It is important to check your weight daily and watch how much salt and liquids you have.  It will help you to manage your heart failure.   Stressed notifying MD sooner of yellow zone symptoms     . Track  and Manage Symptoms   On track    Follow Up Date 10/30/20 Timeframe:  Long-Range Goal Priority:  Medium Start Date:   03/14/20                          Expected End Date: 12/30/20                      - bring diary to all appointments - follow rescue plan if symptoms flare-up - know when to call the doctor - dress right for the weather, hot or cold  Reinforced daily heart failure management skills limiting salt, weighing daily, taking medications as prescribed  Why is this important?   You will be able to handle your symptoms better if you keep track of them.  Making some simple changes to your lifestyle will help.  Eating healthy is one thing you can do to take good care of yourself. Barriers: Health Behaviors Knowledge  Notes: Notified cardiology office of concerns regarding weight gain  Commended patient on daily weight monitoring        Plan:  Follow-up:  Patient agrees to Care Plan and Follow-up. Will schedule follow up call within a week.  Await return call from Cardiology office, follow up after Serious Illness palliative care visit.   Joylene Draft, RN, BSN  [facility] Management Coordinator  [phone]- Mobile [phone]- Toll Free Main Office

## 2020-10-25 NOTE — Telephone Encounter (Signed)
[name] with [facility] called in regards to patient's weight gain. Weight continues to go up and down. 195 last week.   Monday 193 Tuesday 189 Wednesday 188  Swelling in her legs with shortness of breath

## 2020-10-26 ENCOUNTER — Telehealth: Payer: Self-pay | Admitting: *Deleted

## 2020-10-26 DIAGNOSIS — J449 Chronic obstructive pulmonary disease, unspecified: Secondary | ICD-10-CM | POA: Diagnosis not present

## 2020-10-26 DIAGNOSIS — N184 Chronic kidney disease, stage 4 (severe): Secondary | ICD-10-CM | POA: Diagnosis not present

## 2020-10-26 DIAGNOSIS — I5042 Chronic combined systolic (congestive) and diastolic (congestive) heart failure: Secondary | ICD-10-CM | POA: Diagnosis not present

## 2020-10-26 DIAGNOSIS — J9612 Chronic respiratory failure with hypercapnia: Secondary | ICD-10-CM | POA: Diagnosis not present

## 2020-10-26 DIAGNOSIS — J9611 Chronic respiratory failure with hypoxia: Secondary | ICD-10-CM | POA: Diagnosis not present

## 2020-10-26 DIAGNOSIS — Z515 Encounter for palliative care: Secondary | ICD-10-CM | POA: Diagnosis not present

## 2020-10-26 NOTE — Telephone Encounter (Signed)
   Telephone encounter was:  Unsuccessful.  10/26/2020 Name: Denise Freeman MRN: [id number] DOB: 1950/10/14  Unsuccessful outbound call made today to assist with:  Transportation Needs   Outreach Attempt:  1st Attempt  A HIPAA compliant voice message was left requesting a return call.  Instructed patient to call back at   Instructed patient to call back at [phone]  at their earliest convenience.  [place], Care Management  [phone] [address] , [place] [address] Email : [email or web address]

## 2020-10-27 ENCOUNTER — Telehealth: Payer: Self-pay | Admitting: *Deleted

## 2020-10-27 NOTE — Telephone Encounter (Signed)
Very high dose diuretics, I would defer to her nephrologist at this point  Zandra Abts MD

## 2020-10-27 NOTE — Telephone Encounter (Signed)
   Telephone encounter was:  Unsuccessful.  10/27/2020 Name: Denise Freeman MRN: [id number] DOB: 11-19-1950  Unsuccessful outbound call made today to assist with:  Transportation Needs   Outreach Attempt:  2nd Attempt  A HIPAA compliant voice message was left requesting a return call.  Instructed patient to call back at   Instructed patient to call back at [phone]  at their earliest convenience.   [place], Care Management  [phone] [address] , [place] [address] Email : [email or web address]

## 2020-11-01 ENCOUNTER — Other Ambulatory Visit: Payer: Self-pay | Admitting: *Deleted

## 2020-11-01 ENCOUNTER — Telehealth: Payer: Self-pay | Admitting: *Deleted

## 2020-11-01 NOTE — Patient Outreach (Signed)
[facility]) Care Management  11/01/2020  [facility] [birth year] [id number]   Care Coordination Telephone Assessment .   Patient is a 70 year old female with PMHX: Combined Systolic and Diastolic Heart Failure, Chronic hypoxic respiratory failure, chronic oxygen therapyat 3 litersOSA with Bipap, Chronic pain syndrome, hypertension,Chronic Atrial Fib,Diabetes( A1c 8.5% ib 08/10/20 ) Depression with anxiety, partially blind both eye s, hard of hearing.   Subjective: Incoming follow up call from [place] at Dr. Harl Bowie office regarding call in last week regarding patient complaint of fluid weight gain. Per communication from provider, patient on very high diurectics would defer to nephrologist at this point.   Outreach call to [facility] [place], to follow up with NP that visited patient in the home.  Spoke with Colletta Maryland at the office and she will have NP contact me.   Subjective:  Successful follow up call to patient ,she reports feeling a little better . She report breathing is about the same, today's weight 188, "a little less" , still has swelling.  Patient reports receiving call from PCP, [name] with instructions to take Metolazone once daily for 5 days  and she began on today. Discussed with patient return call from cardiology and recommendations to discuss fluid concern with Nephrology, patient states she plans to take medication as prescribed by [name], now and will then plan follow up with Kidney doctor emphasized importance of follow up for fluid management due to chronic kidney condition, she verbalized understanding.   Patient discussed having episode of low blood sugar with reading down to 50 and 90 once. She discussed she has to keep her blood sugar at least 150 range to avoid having "spells of weakness. She discussed forgetting to mention to PCP office but will return call on today. She states taking 20  units at bedtime and feels that is too much.  She discussed contacting PCP office regarding Freestyle libre scanner and questions whether it is working properly, she reports having a metrix meter on hand also for use.        Objective:   Encounter Medications:  Outpatient Encounter Medications as of 11/01/2020  Medication Sig Note  . albuterol (PROVENTIL) (2.5 MG/3ML) 0.083% nebulizer solution Take 3 mLs (2.5 mg total) by nebulization every 6 (six) hours as needed for wheezing or shortness of breath.   . allopurinol (ZYLOPRIM) 100 MG tablet TAKE 1 TABLET BY MOUTH ONCE DAILY. (Patient taking differently: Take 100 mg by mouth daily.)   . apixaban (ELIQUIS) 5 MG TABS tablet Take 1 tablet (5 mg total) by mouth 2 (two) times daily.   Marland Kitchen atorvastatin (LIPITOR) 20 MG tablet Take 1 tablet (20 mg total) by mouth daily. (Patient not taking: Reported on 09/05/2020) 09/05/2020: States she never got it.   . budesonide (PULMICORT) 0.5 MG/2ML nebulizer solution Take 2 mLs (0.5 mg total) by nebulization 2 (two) times daily.   . cetirizine (ZYRTEC) 10 MG tablet Take 10 mg by mouth daily.   . cholecalciferol (VITAMIN D) 400 units TABS tablet Take 1 tablet (400 Units total) by mouth 2 (two) times daily.   . clobetasol cream (TEMOVATE) 8.29 % Apply 1 application topically daily.    . colchicine 0.6 MG tablet Take 0.6 mg by mouth daily as needed. gout   . FEROSUL 325 (65 Fe) MG tablet Take 325 mg by mouth daily.    . fluticasone (FLONASE) 50 MCG/ACT nasal spray Place 1 spray into both nostrils 2 (two) times  daily.   . furosemide (LASIX) 40 MG tablet TAKE 3 TABLETS BY MOUTH TWICE DAILY   . Insulin Glargine (BASAGLAR KWIKPEN) 100 UNIT/ML Inject 20 Units into the skin 2 (two) times daily. (Patient taking differently: No sig reported) 10/03/2020: Reports taking 20 units at hs onlly   . insulin lispro (HUMALOG) 100 UNIT/ML KiwkPen Inject 0.02 mLs (2 Units total) into the skin 3 (three) times daily before meals.   Marland Kitchen  ipratropium (ATROVENT) 0.02 % nebulizer solution Take 2.5 mLs (0.5 mg total) by nebulization every 6 (six) hours as needed for wheezing or shortness of breath.   . metolazone (ZAROXOLYN) 2.5 MG tablet Take 1.25 mg by mouth 3 (three) times a week. 10/25/2020: Restarted by [name], NP  . metoprolol succinate (TOPROL XL) 25 MG 24 hr tablet Take 1.5 tablets (37.5 mg total) by mouth daily.   . mirtazapine (REMERON) 15 MG tablet Take 1 tablet (15 mg total) by mouth at bedtime. 03/22/2020: LF: 03/08/2020 DS:28   . modafinil (PROVIGIL) 200 MG tablet Take 1 tablet (200 mg total) by mouth daily.   . montelukast (SINGULAIR) 10 MG tablet Take 1 tablet (10 mg total) by mouth at bedtime. 03/22/2020: LF: 03/08/2020 DS:28   . oxyCODONE-acetaminophen (PERCOCET/ROXICET) 5-325 MG tablet Take 1 tablet by mouth 2 (two) times daily as needed for severe pain. 03/22/2020: LF:03/01/2020 DS:30   . pantoprazole (PROTONIX) 20 MG tablet TAKE (1) TABLET BY MOUTH TWICE A DAY BEFORE MEALS. (BREAKFAST AND SUPPER) (Patient taking differently: Take 20 mg by mouth 2 (two) times daily before a meal.) 03/22/2020: LF: 03/08/2020 DS:28   . primidone (MYSOLINE) 50 MG tablet TAKE ONE TABLET BY MOUTH AT BEDTIME. (Patient taking differently: Take 50 mg by mouth at bedtime.) 03/22/2020: LF: 03/08/2020 DS:28   . Probiotic Product (PROBIOTIC PO) Take 1 tablet by mouth at bedtime.   . revefenacin (YUPELRI) 175 MCG/3ML nebulizer solution Take 175 mcg by nebulization daily.   . TRUE METRIX BLOOD GLUCOSE TEST test strip USE TO TEST BLOOD SUGAR AS DIRECTED.   . Vitamin A 2400 MCG (8000 UT) CAPS Take 8,000 Units by mouth every morning.  03/22/2020: LF: 03/08/2020 DS: 28  . Vitamin D, Ergocalciferol, (DRISDOL) 1.25 MG (50000 UNIT) CAPS capsule Take 1 capsule (50,000 Units total) by mouth every 7 (seven) days.   . [DISCONTINUED] potassium chloride 20 MEQ TBCR Take 20 mEq by mouth 2 (two) times daily.    No facility-administered encounter  medications on file as of 11/01/2020.    Functional Status:  In your present state of health, do you have any difficulty performing the following activities: 03/23/2020 03/20/2020  Hearing? [place]? Y -  Difficulty concentrating or making decisions? N -  Walking or climbing stairs? Y -  Comment - -  Dressing or bathing? Y -  Comment - -  Doing errands, shopping? Deport and eating ? - -  Comment - -  Using the Toilet? - -  In the past six months, have you accidently leaked urine? - -  Do you have problems with loss of bowel control? - -  Managing your Medications? - -  Managing your Finances? - -  Comment - -  Housekeeping or managing your Housekeeping? - -  Comment - -  Some recent data might be hidden    Fall/Depression Screening: Fall Risk  02/01/2020 06/11/2019 05/17/2019  Falls in the past year? 1 1 1   Number falls in past yr:  0 1 1  Injury with Fall? 0 1 1  Comment - - -  Risk Factor Category  - - -  Comment - - -  Risk for fall due to : History of fall(s);Impaired balance/gait;Impaired vision History of fall(s) History of fall(s);Impaired vision;Impaired mobility  Risk for fall due to: Comment - - -  Follow up Falls prevention discussed Falls evaluation completed;Education provided Education provided;Falls prevention discussed   PHQ 2/9 Scores 09/19/2020 05/17/2019 11/28/2017 11/27/2017 10/03/2017 09/15/2017 09/05/2017  PHQ - 2 Score 1 0 0 5 6 0 0  PHQ- 9 Score - - - - 23 - -    Assessment:  Goals Addressed            This Visit's Progress   . Find Help in My Community   On track    Timeframe:  Short-Term Goal Priority:  Medium Start Date:  09/11/20                           Expected End Date:   10/30/20                    Follow Up Date 11/30/20   - follow-up on any referrals for help I am given    Why is this important?    Knowing how and where to find help for yourself or family in your neighborhood and community is an important  skill.   You will want to take some steps to learn how.    Barriers: Knowledge  Notes:  Reviewed recent serious illness NP home visit, patient acknowledges visit scheduled for 6/2 follow due to concern for fluid/weight gain. Verified patient having contact number for [facility] care guide assisting in arranging transportation .     Marland Kitchen Making and keeping appointments   Not on track    Follow Up Date 11/30/20 Timeframe:  Long-Range Goal Priority:  High Start Date:    03/29/20                         Expected End Date: 12/30/20                    - call to cancel if needed - keep a calendar with appointment dates Reschedule missed appointments  reinforced    Why is this important?   Part of staying healthy is seeing the doctor for follow-up care.  If you forget your appointments, there are some things you can do to stay on track.    Barriers: Health Behaviors Transportation  Notes: Verified successful outreach from [place] to assist with transportation resources. I assisted patient with rescheduling ECHO , stressed importance of attending appointment as information from test critical to managing medical care. Verified patient has contact number for care guide and agrees to return call to her on today.      . Manage My Medicine   On track    Follow Up Date 11/30/20 Timeframe:  Long-Range Goal Priority:  High Start Date:     03/14/20                       Expected End Date:  12/30/20                    - call for medicine refill 2 or 3 days before it runs out  - take medications as prescribed per most  recent after visit summary   Why is this important?   These steps will help you keep on track with your medicines.    Barriers: None  Notes:  Patient is aware of daily medication list stating recent change     . Monitor and Manage My Blood Sugar   On track    Follow Up Date 11/30/20 Timeframe:  Long-Range Goal Priority:  High Start Date:  03/14/20                           Expected  End Date:  12/30/20                       - check blood sugar at prescribed times - check blood sugar if I feel it is too high or too low - take the blood sugar meter to all doctor visits  Replace libre sensor to monitor blood sugars as recommended and with assistance of office as needed    Why is this important?   Checking your blood sugar at home helps to keep it from getting very high or very low.  Writing the results in a diary or log helps the doctor know how to care for you.  Your blood sugar log should have the time, date and the results.  Also, write down the amount of insulin or other medicine that you take.  Other information, like what you ate, exercise done and how you were feeling, will also be helpful.  Barriers: Health Behaviors Knowledge     Notes: Review of sign/symptoms of hypoglycemia and treatment plan, commended for making contact with PCP regarding recent low readings as well as follow up regarding freestyle Libre concerns. Will discuss with serious illness NP to check meter at visit to identify concerns with device    . Obtain Eye Exam   Not on track    Follow Up Date 12/30/20 Timeframe:  Long-Range Goal Priority:  Medium Start Date:   1012/21                          Expected End Date:    12/30/20                - keep appointment with eye doctor  Rescheduled missed appointments  encouraged    Why is this important?   Eye check-ups are important when you have diabetes.  Vision loss can be prevented.   Barriers: Health Behaviors  Notes:  Stressed importance of annual eye exam, patient agreement to rescheduling after cardiology appointments     . Set My Target A1C   On track    Follow Up Date: 11/30/20 Timeframe:  Long-Range Goal Priority:  Medium Start Date:    03/14/20                         Expected End Date:   11/30/20                  - set target A1C    Why is this important?   Your target A1C is decided together by you and your doctor.  It is  based on several things like your age and other health issues.    Barriers: Knowledge  Notes:  Reviewed recent improvement with Decrease in A1c to 8.5% from 12 range, congratulated on progress    . track and  manage activity and exertion   On track    Follow Up Date: 11/30/20 Timeframe:  Long-Range Goal Priority:  Medium Start Date:   03/14/20                          Expected End Date: 7/30/ 22                       - pace activity allowing for rest     Why is this important?   Exercising is very important when managing your heart failure.  It will help your heart get stronger.    Notes:  Discussed fall prevention and verified not additional falls.  Reinforced notifying MD of concern for injury or no improvement with bruising . Discussed spacing out activiities during the day, and when she has an appointment start preparing the day before.     . Track and Manage Fluids and Swelling   On track    Follow Up Date 11/17/20 Timeframe:  Long-Range Goal Priority:  Medium Start Date:     03/14/20                        Expected End Date:   12/30/20                 - call office if I gain more than 2 pounds in one day or 5 pounds in one week - do ankle pumps when sitting - keep legs up while sitting - track weight in diary - watch for swelling in feet, ankles and legs every day - weigh myself daily  Reinforced action plan for yellow zone symptoms .  Barriers: Health Behaviors Knowledge  Why is this important?   It is important to check your weight daily and watch how much salt and liquids you have.  It will help you to manage your heart failure.  Notes: commended patient on keeping track of worsening symptoms  by keeping a log of weights and blood pressures     . Track and Manage Symptoms   On track    Follow Up Date 11/20/20 Timeframe:  Long-Range Goal Priority:  Medium Start Date:   03/14/20                          Expected End Date: 12/30/20                      - bring  diary to all appointments - follow rescue plan if symptoms flare-up - know when to call the doctor - dress right for the weather, hot or cold  Reinforced daily heart failure management skills limiting salt, weighing daily, taking medications as prescribed   Why is this important?   You will be able to handle your symptoms better if you keep track of them.  Making some simple changes to your lifestyle will help.  Eating healthy is one thing you can do to take good care of yourself. Barriers: Health Behaviors Knowledge  Notes: Reinforced worsening heart failure symptoms to notify MD of, urged limiting salt and fluids limits reviewed.        Plan:  Follow-up:  Patient agrees to Care Plan and Follow-up.plan follow up call in the next week.  Will collaborate with [name], NP from serious illness program follow up visit on last week as well as planned revisit  on 6/2, and follow up nephrology as recommended.    Joylene Draft, RN, BSN  [place] Management Coordinator  [phone]- Mobile [phone]- Toll Free Main Office

## 2020-11-01 NOTE — Telephone Encounter (Signed)
[name] informed and verbalized understanding-says she will contact patient today to discuss.

## 2020-11-01 NOTE — Telephone Encounter (Signed)
   Telephone encounter was:  Successful.  11/01/2020 Name: Denise Freeman MRN: [id number] DOB: 1950-10-22  Denise Freeman is a 70 y.o. year old female who is a primary care patient of [name], NP . The community resource team was consulted for assistance with Transportation Needs   Care guide performed the following interventions: Patient provided with information about care guide support team and interviewed to confirm resource needs.  Follow Up Plan:  Care guide will follow up with patient by phone over the next day  [place], Care Management  [phone] [address] , [place] [address] Email : [email or web address]

## 2020-11-02 ENCOUNTER — Ambulatory Visit: Payer: Self-pay | Admitting: *Deleted

## 2020-11-03 ENCOUNTER — Telehealth: Payer: Self-pay | Admitting: *Deleted

## 2020-11-03 NOTE — Telephone Encounter (Signed)
   Telephone encounter was:  Unsuccessful.  11/03/2020 Name: Denise Freeman MRN: [id number] DOB: 1951-05-05  Unsuccessful outbound call made today to assist with:  Transportation Needs   Outreach Attempt:  1st Attempt  A HIPAA compliant voice message was left requesting a return call.  Instructed patient to call back at patient transportation organized for 6/17 to get an echo at [facility],   Instructed patient to call back at [phone]  at their earliest convenience.   [place], Care Management  [phone] [address] , [place] [address] Email : [email or web address]

## 2020-11-06 ENCOUNTER — Other Ambulatory Visit: Payer: Self-pay | Admitting: *Deleted

## 2020-11-06 DIAGNOSIS — E118 Type 2 diabetes mellitus with unspecified complications: Secondary | ICD-10-CM | POA: Diagnosis not present

## 2020-11-06 DIAGNOSIS — N184 Chronic kidney disease, stage 4 (severe): Secondary | ICD-10-CM | POA: Diagnosis not present

## 2020-11-06 DIAGNOSIS — I5042 Chronic combined systolic (congestive) and diastolic (congestive) heart failure: Secondary | ICD-10-CM | POA: Diagnosis not present

## 2020-11-06 DIAGNOSIS — J449 Chronic obstructive pulmonary disease, unspecified: Secondary | ICD-10-CM | POA: Diagnosis not present

## 2020-11-06 DIAGNOSIS — Z515 Encounter for palliative care: Secondary | ICD-10-CM | POA: Diagnosis not present

## 2020-11-06 DIAGNOSIS — J9612 Chronic respiratory failure with hypercapnia: Secondary | ICD-10-CM | POA: Diagnosis not present

## 2020-11-06 DIAGNOSIS — J9611 Chronic respiratory failure with hypoxia: Secondary | ICD-10-CM | POA: Diagnosis not present

## 2020-11-06 NOTE — Patient Outreach (Signed)
[facility]) Care Management  11/06/2020  Denise Freeman [birth year] [id number]  Care Coordination    Subjective: Incoming call from Enis Gash, NP with Serious Ilness/Palliative care program. [name] reports recent visit with patient, She discussed concerns with patient continued edema of lower extremities, reinforced elevating legs, she will also explore with community agencies that may have a lift chair to donate. She states patient recent weight is down to 182 today, she explains patient being started on metolazone 2.5 mg 5 times a week. Patient prior weight up to 188-190. Discussed my follow up from cardiology office recommending defer to nephrologist office regarding management of fluid weight gain.  Orlean Bradford, NP states she reviewed patient Freestyle libre meter results, she discussed patient concerns she reviewed recent readings during the night checked during the night HS 180, 247 am, reading 180. She also reviewed prior episode in the last week  of reading at 12 mn 51, 72 at recheck, she reports patient not sleeping well during the night , due to complaint of fear of low readings. Patient discussed with her she gets symptomatic with reading CBG reading 150 or less. [name] states she has been able to review meter did not determine any problems with meter function.  Gordy Levan, NP to call [name], NP.   Placed call to Nephrology office to discuss concerns regarding patient CHF fluid management,and recommendation of cardiology to defer to nephrology,  able to leave a message on nurse line regarding with my return call number.  Placed call to [name], NP office spoke with nurse Ginger  clarified patient Metolazone schedule, beginning June 1 2.5 mg daily x 5 days then return to 1/2 tablet 3 times a week.    Plan Will plan return call to patient in the next 2 business days as scheduled.    Denise Draft, RN, BSN  [place] Management  Coordinator  [phone]- Mobile [phone]- Toll Free Main Office

## 2020-11-08 ENCOUNTER — Other Ambulatory Visit: Payer: Self-pay | Admitting: *Deleted

## 2020-11-08 NOTE — Patient Outreach (Signed)
[facility]) Care Management  [place]  11/08/2020   Denise Freeman December 04, 1950 [id number]   Telephone Assessment   Patient is a 70 year old female with PMHX: Combined Systolic and Diastolic Heart Failure, Chronic hypoxic respiratory failure, chronic oxygen therapyat 3 litersOSA with Bipap, Chronic pain syndrome, hypertension,Chronic Atrial Fib,Diabetes( A1c8.5% ib 08/10/20 )Depression with anxiety, partially blind both eye  hard of hearing   Subjective:  Successful outreach call to patient, she discussed concern regarding fluid in her legs, weight is down to 182, reports stomach not as tight. She discussed difficulty with mobility due to increase fluid in her legs. She discussed weeping area lower legs after abrasion to area after fall a few weeks ago. Reinforced as previous explained by Enis Gash NP of Serious illness program explained keeping legs elevated and limited salt.   Patient voiced concern regarding not sleeping well due to concern for blood sugar states that she states she gets symptoms if blood sugar less than 150. She reports blood sugar 123 at bedtime, at 310 am, 142 she reports having symptoms of shakiness  and treating  She reports calling [name] office and will waiting on a return call.      Encounter Medications:  Outpatient Encounter Medications as of 11/08/2020  Medication Sig Note  . albuterol (PROVENTIL) (2.5 MG/3ML) 0.083% nebulizer solution Take 3 mLs (2.5 mg total) by nebulization every 6 (six) hours as needed for wheezing or shortness of breath.   . allopurinol (ZYLOPRIM) 100 MG tablet TAKE 1 TABLET BY MOUTH ONCE DAILY. (Patient taking differently: Take 100 mg by mouth daily.)   . apixaban (ELIQUIS) 5 MG TABS tablet Take 1 tablet (5 mg total) by mouth 2 (two) times daily.   Marland Kitchen atorvastatin (LIPITOR) 20 MG tablet Take 1 tablet (20 mg total) by mouth daily. (Patient not taking: Reported on 09/05/2020) 09/05/2020: States she  never got it.   . budesonide (PULMICORT) 0.5 MG/2ML nebulizer solution Take 2 mLs (0.5 mg total) by nebulization 2 (two) times daily.   . cetirizine (ZYRTEC) 10 MG tablet Take 10 mg by mouth daily.   . cholecalciferol (VITAMIN D) 400 units TABS tablet Take 1 tablet (400 Units total) by mouth 2 (two) times daily.   . clobetasol cream (TEMOVATE) 0.93 % Apply 1 application topically daily.    . colchicine 0.6 MG tablet Take 0.6 mg by mouth daily as needed. gout   . FEROSUL 325 (65 Fe) MG tablet Take 325 mg by mouth daily.    . fluticasone (FLONASE) 50 MCG/ACT nasal spray Place 1 spray into both nostrils 2 (two) times daily.   . furosemide (LASIX) 40 MG tablet TAKE 3 TABLETS BY MOUTH TWICE DAILY   . Insulin Glargine (BASAGLAR KWIKPEN) 100 UNIT/ML Inject 20 Units into the skin 2 (two) times daily. (Patient taking differently: No sig reported) 10/03/2020: Reports taking 20 units at hs onlly   . insulin lispro (HUMALOG) 100 UNIT/ML KiwkPen Inject 0.02 mLs (2 Units total) into the skin 3 (three) times daily before meals.   Marland Kitchen ipratropium (ATROVENT) 0.02 % nebulizer solution Take 2.5 mLs (0.5 mg total) by nebulization every 6 (six) hours as needed for wheezing or shortness of breath.   . metolazone (ZAROXOLYN) 2.5 MG tablet Take 1.25 mg by mouth 3 (three) times a week. 10/25/2020: Restarted by [name], NP  . metoprolol succinate (TOPROL XL) 25 MG 24 hr tablet Take 1.5 tablets (37.5 mg total) by mouth daily.   . mirtazapine (  REMERON) 15 MG tablet Take 1 tablet (15 mg total) by mouth at bedtime. 03/22/2020: LF: 03/08/2020 DS:28   . modafinil (PROVIGIL) 200 MG tablet Take 1 tablet (200 mg total) by mouth daily.   . montelukast (SINGULAIR) 10 MG tablet Take 1 tablet (10 mg total) by mouth at bedtime. 03/22/2020: LF: 03/08/2020 DS:28   . oxyCODONE-acetaminophen (PERCOCET/ROXICET) 5-325 MG tablet Take 1 tablet by mouth 2 (two) times daily as needed for severe pain. 03/22/2020: LF:03/01/2020 DS:30   .  pantoprazole (PROTONIX) 20 MG tablet TAKE (1) TABLET BY MOUTH TWICE A DAY BEFORE MEALS. (BREAKFAST AND SUPPER) (Patient taking differently: Take 20 mg by mouth 2 (two) times daily before a meal.) 03/22/2020: LF: 03/08/2020 DS:28   . primidone (MYSOLINE) 50 MG tablet TAKE ONE TABLET BY MOUTH AT BEDTIME. (Patient taking differently: Take 50 mg by mouth at bedtime.) 03/22/2020: LF: 03/08/2020 DS:28   . Probiotic Product (PROBIOTIC PO) Take 1 tablet by mouth at bedtime.   . revefenacin (YUPELRI) 175 MCG/3ML nebulizer solution Take 175 mcg by nebulization daily.   . TRUE METRIX BLOOD GLUCOSE TEST test strip USE TO TEST BLOOD SUGAR AS DIRECTED.   . Vitamin A 2400 MCG (8000 UT) CAPS Take 8,000 Units by mouth every morning.  03/22/2020: LF: 03/08/2020 DS: 28  . Vitamin D, Ergocalciferol, (DRISDOL) 1.25 MG (50000 UNIT) CAPS capsule Take 1 capsule (50,000 Units total) by mouth every 7 (seven) days.   . [DISCONTINUED] potassium chloride 20 MEQ TBCR Take 20 mEq by mouth 2 (two) times daily.    No facility-administered encounter medications on file as of 11/08/2020.    Functional Status:  In your present state of health, do you have any difficulty performing the following activities: 03/23/2020 03/20/2020  Hearing? [place]? Y -  Difficulty concentrating or making decisions? N -  Walking or climbing stairs? Y -  Comment - -  Dressing or bathing? Y -  Comment - -  Doing errands, shopping? [place] and eating ? - -  Comment - -  Using the Toilet? - -  In the past six months, have you accidently leaked urine? - -  Do you have problems with loss of bowel control? - -  Managing your Medications? - -  Managing your Finances? - -  Comment - -  Housekeeping or managing your Housekeeping? - -  Comment - -  Some recent data might be hidden    Fall/Depression Screening: Fall Risk  02/01/2020 06/11/2019 05/17/2019  Falls in the past year? 1 1 1   Number falls in past yr: 0 1 1   Injury with Fall? 0 1 1  Comment - - -  Risk Factor Category  - - -  Comment - - -  Risk for fall due to : History of fall(s);Impaired balance/gait;Impaired vision History of fall(s) History of fall(s);Impaired vision;Impaired mobility  Risk for fall due to: Comment - - -  Follow up Falls prevention discussed Falls evaluation completed;Education provided Education provided;Falls prevention discussed   PHQ 2/9 Scores 09/19/2020 05/17/2019 11/28/2017 11/27/2017 10/03/2017 09/15/2017 09/05/2017  PHQ - 2 Score 1 0 0 5 6 0 0  PHQ- 9 Score - - - - 23 - -    Assessment:   Care Plan Care Plan : Heart Failure (Adult)  Updates made by Alfonzo Feller, RN since 11/08/2020 12:00 AM    Problem: Disease Progression (Heart Failure)   Priority: High  Onset Date: 03/14/2020  Long-Range Goal: Health Optimized as evidenced by patient report of no hospital readmission over the next 60 days   Start Date: 03/14/2020  Expected End Date: 11/30/2020  Recent Progress: On track  Priority: High  Note:   Evidence-based guidance:  Assess need for potential diet and fluid modification, such as reduced sodium or fluid intake.  Minimize unnecessary dietary restrictions to increase oral intake. Note: Sodium restriction should be individualized to the patient and clinical status.  Facilitate home monitoring of weight.   Notes: Reviewed recent weight 182  Emphasized action plan of weight gain greater than 3 pound in a day, 5 in a week , increased swelling. Stressed importance of limiting fluids and salt in diet patient acknowledges working on this.    Task: Optimize Health   Due Date: 11/30/2020  Priority: Routine  Note:   Care Management Activities:    - home monitoring of weight gain or loss encouraged - unnecessary dietary restrictions minimized - weight gain or loss reviewed and trended   Barriers: Health Behaviors Knowledge  Notes: Discussed measures to assist with lower leg swelling, elevation , limiting  salt. Discussed other measure to help with reducing swelling in lower legs other than adjustment in fluid medication. Compression hose , reviewed PCP office to request [facility] RN for support    Problem: Activity Tolerance (Heart Failure)   Priority: High  Onset Date: 03/14/2020    Long-Range Goal: Activity Tolerance Optimized as evidenced by patient report of ability to complete usual daily activity in home over the next 90 days   Start Date: 03/14/2020  Expected End Date: 11/30/2020  Recent Progress: On track  Priority: High  Note:   Evidence-based guidance:   Encourage optimal, safe functional mobility and self-care performance based on ability and tolerance.   Promote breathing and energy conservation techniques, such as pursed-lip breathing, preplanning and pacing of activity, balancing activity and rest.  Notes:Fall safety prevention reviewed,keeping frequent used items nearby, always using assistive devices . Reviewed benefit so home therapy to help with balance , exercise for fall prevention .    Task: Maintain Strength and Functional Ability   Due Date: 11/30/2020  Priority: Routine  Note:   Care Management Activities:    - activity or exercise based on tolerance encouraged - energy conservation techniques promoted - self-care encouraged Barriers: Health Behaviors Knowledge  Knowledge    Notes encouraged safety related to increase in fluid in lower legs, discussed elevation on lower extremities as tolerated, discussed sitting with legs dependent may lead to increase in swelling.     Care Plan : Diabetes Type 2 (Adult)  Updates made by Alfonzo Feller, RN since 11/08/2020 12:00 AM    Problem: Glycemic Management (Diabetes, Type 2)   Priority: High  Onset Date: 03/14/2020    Long-Range Goal: Glycemic Management Optimized as evidenced by patient report of eating regular balanced  meals over the next 60 days   Start Date: 08/09/2020  Expected End Date: 11/30/2020  Recent  Progress: On track  Priority: High  Note:   Evidence-based guidance:  Anticipate A1C testing (point-of-care) every 3 to 6 months based on goal attainment.  Review mutually-set A1C goal or target range.  .  Promote self-monitoring of blood glucose levels.  Notes: . Reviewed recent blood sugar readings, lowest reading 123, patient reports having hypoglycemia symptoms if less than 150, reviewed hypoglycemia range, urged contacting MD of occurrence. Discussed call to PCP regarding her concerns  office to reach out to patient .  Task: Alleviate Barriers to Glycemic Management   Due Date: 11/30/2020  Priority: Routine  Note:   Care Management Activities:    - A1C testing facilitated - blood glucose monitoring encouraged - blood glucose readings reviewed - self-awareness of signs/symptoms of hypo or hyperglycemia encouraged    Barriers: Health Behaviors Knowledge  Notes : Reviewed [name] NP from Serious illness program review of her meter no problems with device noted. Discussed balanced meal plans    Problem: Disease Progression (Diabetes, Type 2)   Priority: Medium  Onset Date: 03/14/2020    Long-Range Goal: Disease Progression Prevented or Minimized as evidenced by patient report of reduction in Hemoglobin A1c by 2 points over the next 90 days   Start Date: 08/09/2020  Expected End Date: 11/30/2020  Recent Progress: On track  Priority: Medium  Note:   Evidence-based guidance:  Prepare patient for laboratory and diagnostic exams based on risk and present  Address pregnancy planning and contraceptive choice, especially when prescribing antihypertensive or statin.  Ensure completion of annual comprehensive foot exam and dilated eye exam.   Implement additional individualized goals and interventions based on identified risk factors.   Notes: Discussed recent blood sugar readings,lowest reading., contact to PCP office, Verified patient current insulin dose of 20 unit of  long acting  insulin a bedtime.    Task: Monitor and Manage Follow-up for Comorbidities   Due Date: 11/30/2020  Priority: Routine  Note:   Care Management Activities:    - healthy lifestyle promoted - signs/symptoms of comorbidities identified  -rescheduling missed appointments with nephrology  and annual eye exam reinforced   Barriers: Health Behaviors Knowledge Visual  Notes:follow up on education on diabetes in relation to kidneys , heart, vision and emphasized keeping all appointments.    Care Plan : Wellness (Adult)  Updates made by Alfonzo Feller, RN since 11/08/2020 12:00 AM    Problem: Medication Adherence (Wellness)   Priority: Medium  Onset Date: 03/14/2020    Long-Range Goal: Medication Adherence Maintained as evidenced by patient report of taking medication as prescribed over the next 60 days   Start Date: 03/14/2020  Expected End Date: 11/30/2020  Recent Progress: On track  Priority: Medium  Note:   Evidence-based guidance:  Develop a complete and accurate medication list including those prescribed and over-the-counter, those taken only occasionally and those not taken by mouth such as injections, inhalers, ointments or creams and drops.  Encourage the use of medication reminders such as clock or cell phone alarm, color coding, pillboxes for am/pm and days of the week, pharmacy refill reminder, auto-refill system or mail-order option. Using pill packaging option .  Marland Kitchen  Provide frequent follow-up providing motivation, encouragement and support when medication nonadherence is identified.   Notes: Patient acknowledges current  medication plan     Task: Optimize Medication Use   Due Date: 11/30/2020  Priority: Routine  Note:   Care Management Activities:    - administration or use of medication demonstrated - understanding of current medications assessed   Barriers: Health Behaviors Knowledge  Notes:  Reinforced  regarding taking medication as prescribed, again reinforced  education on why important to take as prescribed, in relation to chronic kidney condition and how some medication increase stress on kidney, and specialist should make recommendations.      Goals Addressed            This Visit's Progress   . Find Help in My Community   On track    Timeframe:  Short-Term Goal Priority:  Medium Start Date:  09/11/20                           Expected End Date:   10/30/20                    Follow Up Date 11/30/20   - follow-up on any referrals for help I am given    Why is this important?    Knowing how and where to find help for yourself or family in your neighborhood and community is an important skill.   You will want to take some steps to learn how.    Barriers: Knowledge  Notes:  Discussed PCP plans for home health RN services, encouraged to return call to [facility] regarding medicaid. Offered meals on wheels program she declines.     . Making and keeping appointments   On track    Follow Up Date 11/30/20 Timeframe:  Long-Range Goal Priority:  High Start Date:    03/29/20                         Expected End Date: 12/30/20                    - call to cancel if needed - keep a calendar with appointment dates Reschedule missed appointments  reinforced    Why is this important?   Part of staying healthy is seeing the doctor for follow-up care.  If you forget your appointments, there are some things you can do to stay on track.    Barriers: Health Behaviors Transportation  Notes: Patient attempted contact Care Guide regarding setting up appointment for Echo , provided contact number and assisted with making call, able to leave a message     . Manage My Medicine       Follow Up Date 11/30/20 Timeframe:  Long-Range Goal Priority:  High Start Date:     03/14/20                       Expected End Date:  12/30/20                    - call for medicine refill 2 or 3 days before it runs out  - take medications as prescribed per most recent  after visit summary   Why is this important?   These steps will help you keep on track with your medicines.    Barriers: None  Notes:  Patient is aware of daily medication list stating recent change , with metolazone 1/2 tablet 3 times a week. - reinforced adherence to medical plan    . Monitor and Manage My Blood Sugar   On track    Follow Up Date 11/30/20 Timeframe:  Long-Range Goal Priority:  High Start Date:  03/14/20                           Expected End Date:  12/30/20                       - check blood sugar at prescribed times - check blood sugar if I feel it is too high or too low - take the blood sugar meter to all doctor visits  Replace libre sensor to monitor blood sugars as recommended  and with assistance of office as needed    Why is this important?   Checking your blood sugar at home helps to keep it from getting very high or very low.  Writing the results in a diary or log helps the doctor know how to care for you.  Your blood sugar log should have the time, date and the results.  Also, write down the amount of insulin or other medicine that you take.  Other information, like what you ate, exercise done and how you were feeling, will also be helpful.  Barriers: Health Behaviors Knowledge     Notes: commended for continued monitoring blood sugars , and making contact with concerns for low or high readings. Discussed outreach call to PCP office on today.     . track and manage activity and exertion   On track    Follow Up Date: 11/30/20 Timeframe:  Long-Range Goal Priority:  Medium Start Date:   03/14/20                          Expected End Date: 7/30/ 22                       - pace activity allowing for rest     Why is this important?   Exercising is very important when managing your heart failure.  It will help your heart get stronger.    Notes:  Discussed considering benefit of home health to assist with mobility and strength review fall prevention  , .     . Track and Manage Fluids and Swelling   On track    Follow Up Date 11/17/20 Timeframe:  Long-Range Goal Priority:  Medium Start Date:     03/14/20                        Expected End Date:   12/30/20                 - call office if I gain more than 2 pounds in one day or 5 pounds in one week - do ankle pumps when sitting - keep legs up while sitting - track weight in diary - watch for swelling in feet, ankles and legs every day - weigh myself daily  Reinforced action plan for yellow zone symptoms .  Barriers: Health Behaviors Knowledge  Why is this important?   It is important to check your weight daily and watch how much salt and liquids you have.  It will help you to manage your heart failure.  Notes: Reinforced continuing to monitor and log daily weights , notify MD of worsening symptoms .     Marland Kitchen Track and Manage Symptoms   On track    Follow Up Date 11/20/20 Timeframe:  Long-Range Goal Priority:  Medium Start Date:   03/14/20                          Expected End Date: 12/30/20                      - bring diary to all appointments - follow rescue plan if symptoms flare-up - know when to call the doctor - dress right for the weather, hot or cold  Reinforced daily heart failure management skills limiting salt, weighing daily, taking medications as prescribed   Why is  this important?   You will be able to handle your symptoms better if you keep track of them.  Making some simple changes to your lifestyle will help.  Eating healthy is one thing you can do to take good care of yourself. Barriers: Health Behaviors Knowledge  Notes: Reinforced worsening heart failure symptoms to notify MD of, urged limiting salt and fluids limits reviewed. Encouraged keeping legs elevated .        Plan:  Follow-up: Follow-up in 5 day(s) and with care plan.  Placed call to PCP office with nurse Ginger to communicate patient concerns for symptoms of low blood sugar with reading as  low as 123 a night , patient resting well due to this concern.  I also discussed concern regarding patient reported edema lower legs. Limits with mobility  and weeping at one site discussed benefit of home health she states that is part of plan and she will contact patient with update.    Joylene Draft, RN, BSN  [place] Management Coordinator  [phone]- Mobile [phone]- Toll Free Main Office

## 2020-11-09 ENCOUNTER — Telehealth: Payer: Self-pay | Admitting: *Deleted

## 2020-11-09 NOTE — Telephone Encounter (Signed)
   Telephone encounter was:  Successful.  11/09/2020 Name: MAILYN STEICHEN MRN: [id number] DOB: April 21, 1951  Gabrielle Dare Turnbough is a 70 y.o. year old female who is a primary care patient of [name], NP . The community resource team was consulted for assistance with Transportation Needs Scheduled all transportation as told patient prior but called and reiterated that she is scheduled with [facility] for echo appt on 6/17 in her wheelchair with her husband attending  Care guide performed the following interventions: Patient provided with information about care guide support team and interviewed to confirm resource needs.  Follow Up Plan:  No further follow up planned at this time. The patient has been provided with needed resources. [place], Care Management  [phone] [address] , [place] [address] Email : [email or web address]. [email or web address]

## 2020-11-10 ENCOUNTER — Telehealth: Payer: Self-pay | Admitting: Adult Health Nurse Practitioner

## 2020-11-10 NOTE — Telephone Encounter (Signed)
   Denise Freeman DOB: [birth year] MRN: [id number]   RIDER WAIVER AND RELEASE OF LIABILITY  For purposes of improving physical access to our facilities, [facility] is pleased to partner with third parties to provide [facility] patients or other authorized individuals the option of convenient, on-demand ground transportation services (the [place]") through use of the technology service that enables users to request on-demand ground transportation from independent third-party providers.  By opting to use and accept these [employer], I, the undersigned, hereby agree on behalf of myself, and on behalf of any minor child using the Government social research officer for whom I am the parent or legal guardian, as follows:  Government social research officer provided to me are provided by independent third-party transportation providers who are not [employer] or employees and who are unaffiliated with [employer]. [facility] is neither a transportation carrier nor a common or public carrier. [facility] has no control over the quality or safety of the transportation that occurs as a result of the [employer]. [facility] cannot guarantee that any third-party transportation provider will complete any arranged transportation service. [facility] makes no representation, warranty, or guarantee regarding the reliability, timeliness, quality, safety, suitability, or availability of any of the Transport Services or that they will be error free. I fully understand that traveling by vehicle involves risks and dangers of serious bodily injury, including permanent disability, paralysis, and death. I agree, on behalf of myself and on behalf of any minor child using the Transport Services for whom I am the parent or legal guardian, that the entire risk arising out of my use of the [employer] remains solely with me, to the maximum extent permitted under applicable law. The [employer] are provided "as  is" and "as available." [facility] disclaims all representations and warranties, express, implied or statutory, not expressly set out in these terms, including the implied warranties of merchantability and fitness for a particular purpose. I hereby waive and release [facility], its agents, employees, officers, directors, representatives, insurers, attorneys, assigns, successors, subsidiaries, and affiliates from any and all past, present, or future claims, demands, liabilities, actions, causes of action, or suits of any kind directly or indirectly arising from acceptance and use of the [employer]. I further waive and release [facility] and its affiliates from all present and future liability and responsibility for any injury or death to persons or damages to property caused by or related to the use of the [employer]. I have read this Waiver and Release of Liability, and I understand the terms used in it and their legal significance. This Waiver is freely and voluntarily given with the understanding that my right (as well as the right of any minor child for whom I am the parent or legal guardian using the [employer]) to legal recourse against [place] in connection with the [employer] is knowingly surrendered in return for use of these services.   I attest that I read the consent document to Denise Freeman, gave [name] the opportunity to ask questions and answered the questions asked (if any). I affirm that Denise Freeman then provided consent for she's participation in this program.     Denise Freeman

## 2020-11-13 ENCOUNTER — Inpatient Hospital Stay (HOSPITAL_COMMUNITY)
Admission: EM | Admit: 2020-11-13 | Discharge: 2020-11-19 | DRG: 291 | Discharge status: Against Medical Advice | Payer: Medicare Other | Attending: Internal Medicine | Admitting: Internal Medicine

## 2020-11-13 ENCOUNTER — Emergency Department (HOSPITAL_COMMUNITY): Payer: Medicare Other

## 2020-11-13 ENCOUNTER — Other Ambulatory Visit: Payer: Self-pay

## 2020-11-13 DIAGNOSIS — I5033 Acute on chronic diastolic (congestive) heart failure: Secondary | ICD-10-CM | POA: Diagnosis present

## 2020-11-13 DIAGNOSIS — I517 Cardiomegaly: Secondary | ICD-10-CM | POA: Diagnosis not present

## 2020-11-13 DIAGNOSIS — R0602 Shortness of breath: Secondary | ICD-10-CM | POA: Diagnosis not present

## 2020-11-13 DIAGNOSIS — Z5329 Procedure and treatment not carried out because of patient's decision for other reasons: Secondary | ICD-10-CM | POA: Diagnosis present

## 2020-11-13 DIAGNOSIS — R21 Rash and other nonspecific skin eruption: Secondary | ICD-10-CM | POA: Diagnosis not present

## 2020-11-13 DIAGNOSIS — Z8249 Family history of ischemic heart disease and other diseases of the circulatory system: Secondary | ICD-10-CM

## 2020-11-13 DIAGNOSIS — I110 Hypertensive heart disease with heart failure: Secondary | ICD-10-CM | POA: Diagnosis not present

## 2020-11-13 DIAGNOSIS — E11649 Type 2 diabetes mellitus with hypoglycemia without coma: Secondary | ICD-10-CM | POA: Diagnosis present

## 2020-11-13 DIAGNOSIS — N179 Acute kidney failure, unspecified: Secondary | ICD-10-CM | POA: Diagnosis present

## 2020-11-13 DIAGNOSIS — E1122 Type 2 diabetes mellitus with diabetic chronic kidney disease: Secondary | ICD-10-CM | POA: Diagnosis present

## 2020-11-13 DIAGNOSIS — R0689 Other abnormalities of breathing: Secondary | ICD-10-CM | POA: Diagnosis not present

## 2020-11-13 DIAGNOSIS — N1831 Chronic kidney disease, stage 3a: Secondary | ICD-10-CM | POA: Diagnosis present

## 2020-11-13 DIAGNOSIS — Z881 Allergy status to other antibiotic agents status: Secondary | ICD-10-CM

## 2020-11-13 DIAGNOSIS — K219 Gastro-esophageal reflux disease without esophagitis: Secondary | ICD-10-CM | POA: Diagnosis present

## 2020-11-13 DIAGNOSIS — I5043 Acute on chronic combined systolic (congestive) and diastolic (congestive) heart failure: Secondary | ICD-10-CM | POA: Diagnosis not present

## 2020-11-13 DIAGNOSIS — I130 Hypertensive heart and chronic kidney disease with heart failure and stage 1 through stage 4 chronic kidney disease, or unspecified chronic kidney disease: Principal | ICD-10-CM | POA: Diagnosis present

## 2020-11-13 DIAGNOSIS — R062 Wheezing: Secondary | ICD-10-CM

## 2020-11-13 DIAGNOSIS — Z880 Allergy status to penicillin: Secondary | ICD-10-CM

## 2020-11-13 DIAGNOSIS — J90 Pleural effusion, not elsewhere classified: Secondary | ICD-10-CM | POA: Diagnosis not present

## 2020-11-13 DIAGNOSIS — Z79899 Other long term (current) drug therapy: Secondary | ICD-10-CM

## 2020-11-13 DIAGNOSIS — Z888 Allergy status to other drugs, medicaments and biological substances status: Secondary | ICD-10-CM

## 2020-11-13 DIAGNOSIS — J441 Chronic obstructive pulmonary disease with (acute) exacerbation: Secondary | ICD-10-CM | POA: Diagnosis present

## 2020-11-13 DIAGNOSIS — Z7901 Long term (current) use of anticoagulants: Secondary | ICD-10-CM

## 2020-11-13 DIAGNOSIS — Z886 Allergy status to analgesic agent status: Secondary | ICD-10-CM

## 2020-11-13 DIAGNOSIS — J9612 Chronic respiratory failure with hypercapnia: Secondary | ICD-10-CM | POA: Diagnosis present

## 2020-11-13 DIAGNOSIS — J9610 Chronic respiratory failure, unspecified whether with hypoxia or hypercapnia: Secondary | ICD-10-CM | POA: Diagnosis present

## 2020-11-13 DIAGNOSIS — Z794 Long term (current) use of insulin: Secondary | ICD-10-CM

## 2020-11-13 DIAGNOSIS — J9611 Chronic respiratory failure with hypoxia: Secondary | ICD-10-CM | POA: Diagnosis present

## 2020-11-13 DIAGNOSIS — Z833 Family history of diabetes mellitus: Secondary | ICD-10-CM

## 2020-11-13 DIAGNOSIS — I509 Heart failure, unspecified: Secondary | ICD-10-CM

## 2020-11-13 DIAGNOSIS — Z87891 Personal history of nicotine dependence: Secondary | ICD-10-CM

## 2020-11-13 DIAGNOSIS — Z20822 Contact with and (suspected) exposure to covid-19: Secondary | ICD-10-CM | POA: Diagnosis not present

## 2020-11-13 DIAGNOSIS — Z9981 Dependence on supplemental oxygen: Secondary | ICD-10-CM

## 2020-11-13 DIAGNOSIS — E876 Hypokalemia: Secondary | ICD-10-CM | POA: Diagnosis present

## 2020-11-13 DIAGNOSIS — E875 Hyperkalemia: Secondary | ICD-10-CM | POA: Diagnosis present

## 2020-11-13 DIAGNOSIS — Z7951 Long term (current) use of inhaled steroids: Secondary | ICD-10-CM

## 2020-11-13 DIAGNOSIS — E1159 Type 2 diabetes mellitus with other circulatory complications: Secondary | ICD-10-CM | POA: Diagnosis present

## 2020-11-13 DIAGNOSIS — I872 Venous insufficiency (chronic) (peripheral): Secondary | ICD-10-CM

## 2020-11-13 DIAGNOSIS — J449 Chronic obstructive pulmonary disease, unspecified: Secondary | ICD-10-CM | POA: Diagnosis present

## 2020-11-13 DIAGNOSIS — I4891 Unspecified atrial fibrillation: Secondary | ICD-10-CM | POA: Diagnosis not present

## 2020-11-13 DIAGNOSIS — E782 Mixed hyperlipidemia: Secondary | ICD-10-CM | POA: Diagnosis present

## 2020-11-13 DIAGNOSIS — I5042 Chronic combined systolic (congestive) and diastolic (congestive) heart failure: Secondary | ICD-10-CM | POA: Diagnosis not present

## 2020-11-13 DIAGNOSIS — F319 Bipolar disorder, unspecified: Secondary | ICD-10-CM | POA: Diagnosis present

## 2020-11-13 DIAGNOSIS — G4733 Obstructive sleep apnea (adult) (pediatric): Secondary | ICD-10-CM | POA: Diagnosis present

## 2020-11-13 DIAGNOSIS — R0600 Dyspnea, unspecified: Secondary | ICD-10-CM | POA: Diagnosis not present

## 2020-11-13 DIAGNOSIS — R000 Tachycardia, unspecified: Secondary | ICD-10-CM | POA: Diagnosis not present

## 2020-11-13 DIAGNOSIS — Z6841 Body Mass Index (BMI) 40.0 and over, adult: Secondary | ICD-10-CM

## 2020-11-13 DIAGNOSIS — E7800 Pure hypercholesterolemia, unspecified: Secondary | ICD-10-CM | POA: Diagnosis present

## 2020-11-13 DIAGNOSIS — Z818 Family history of other mental and behavioral disorders: Secondary | ICD-10-CM

## 2020-11-13 DIAGNOSIS — Z515 Encounter for palliative care: Secondary | ICD-10-CM | POA: Diagnosis not present

## 2020-11-13 DIAGNOSIS — N184 Chronic kidney disease, stage 4 (severe): Secondary | ICD-10-CM | POA: Diagnosis not present

## 2020-11-13 DIAGNOSIS — Z83438 Family history of other disorder of lipoprotein metabolism and other lipidemia: Secondary | ICD-10-CM

## 2020-11-13 DIAGNOSIS — Z825 Family history of asthma and other chronic lower respiratory diseases: Secondary | ICD-10-CM

## 2020-11-13 DIAGNOSIS — I428 Other cardiomyopathies: Secondary | ICD-10-CM | POA: Diagnosis present

## 2020-11-13 LAB — CBC WITH DIFFERENTIAL/PLATELET
Abs Immature Granulocytes: 0.03 10*3/uL (ref 0.00–0.07)
Basophils Absolute: 0 10*3/uL (ref 0.0–0.1)
Basophils Relative: 0 %
Eosinophils Absolute: 0.4 10*3/uL (ref 0.0–0.5)
Eosinophils Relative: 4 %
HCT: 42.4 % (ref 36.0–46.0)
Hemoglobin: 12.7 g/dL (ref 12.0–15.0)
Immature Granulocytes: 0 %
Lymphocytes Relative: 14 %
Lymphs Abs: 1.3 10*3/uL (ref 0.7–4.0)
MCH: 29.9 pg (ref 26.0–34.0)
MCHC: 30 g/dL (ref 30.0–36.0)
MCV: 99.8 fL (ref 80.0–100.0)
Monocytes Absolute: 0.7 10*3/uL (ref 0.1–1.0)
Monocytes Relative: 7 %
Neutro Abs: 7.1 10*3/uL (ref 1.7–7.7)
Neutrophils Relative %: 75 %
Platelets: 186 10*3/uL (ref 150–400)
RBC: 4.25 MIL/uL (ref 3.87–5.11)
RDW: 15.2 % (ref 11.5–15.5)
WBC: 9.5 10*3/uL (ref 4.0–10.5)
nRBC: 0 % (ref 0.0–0.2)

## 2020-11-13 LAB — COMPREHENSIVE METABOLIC PANEL
ALT: 13 U/L (ref 0–44)
AST: 30 U/L (ref 15–41)
Albumin: 3.5 g/dL (ref 3.5–5.0)
Alkaline Phosphatase: 171 U/L — ABNORMAL HIGH (ref 38–126)
Anion gap: 9 (ref 5–15)
BUN: 65 mg/dL — ABNORMAL HIGH (ref 8–23)
CO2: 31 mmol/L (ref 22–32)
Calcium: 8 mg/dL — ABNORMAL LOW (ref 8.9–10.3)
Chloride: 96 mmol/L — ABNORMAL LOW (ref 98–111)
Creatinine, Ser: 2.32 mg/dL — ABNORMAL HIGH (ref 0.44–1.00)
GFR, Estimated: 22 mL/min — ABNORMAL LOW (ref >60)
Glucose, Bld: 129 mg/dL — ABNORMAL HIGH (ref 70–99)
Potassium: 4.8 mmol/L (ref 3.5–5.1)
Sodium: 136 mmol/L (ref 135–145)
Total Bilirubin: 0.6 mg/dL (ref 0.3–1.2)
Total Protein: 7.2 g/dL (ref 6.5–8.1)

## 2020-11-13 LAB — RESP PANEL BY RT-PCR (FLU A&B, COVID) ARPGX2
Influenza A by PCR: NEGATIVE
Influenza B by PCR: NEGATIVE
SARS Coronavirus 2 by RT PCR: NEGATIVE

## 2020-11-13 LAB — TROPONIN I (HIGH SENSITIVITY): Troponin I (High Sensitivity): 15 ng/L (ref <18)

## 2020-11-13 LAB — BRAIN NATRIURETIC PEPTIDE: B Natriuretic Peptide: 1105 pg/mL — ABNORMAL HIGH (ref 0.0–100.0)

## 2020-11-13 MED ORDER — FUROSEMIDE 10 MG/ML IJ SOLN
80.0000 mg | Freq: Once | INTRAMUSCULAR | Status: AC
  Administered 2020-11-13: 80 mg via INTRAVENOUS
  Filled 2020-11-13: qty 8

## 2020-11-13 MED ORDER — METHYLPREDNISOLONE SODIUM SUCC 125 MG IJ SOLR
80.0000 mg | Freq: Once | INTRAMUSCULAR | Status: AC
  Administered 2020-11-13: 80 mg via INTRAVENOUS
  Filled 2020-11-13: qty 2

## 2020-11-13 MED ORDER — ALBUTEROL SULFATE HFA 108 (90 BASE) MCG/ACT IN AERS
6.0000 | INHALATION_SPRAY | Freq: Once | RESPIRATORY_TRACT | Status: DC
  Filled 2020-11-13: qty 6.7

## 2020-11-13 MED ORDER — IPRATROPIUM-ALBUTEROL 0.5-2.5 (3) MG/3ML IN SOLN
3.0000 mL | Freq: Once | RESPIRATORY_TRACT | Status: AC
  Administered 2020-11-14: 3 mL via RESPIRATORY_TRACT
  Filled 2020-11-13: qty 3

## 2020-11-13 NOTE — ED Provider Notes (Signed)
[facility] Provider Note   CSN: [id number] Arrival date & time: 11/13/20  1848     History Chief Complaint  Patient presents with  . Shortness of Breath    Denise Freeman is a 70 y.o. female with past medical history significant for insulin-dependent diabetes, HTN, COPD, GI bleeding, bipolar 1 disorder, nonischemic cardiomyopathy, chronic combined systolic diastolic heart failure, and atrial fibrillation on Eliquis who presents the ED via EMS with a 3-week history shortness of breath symptoms.  I reviewed patient's medical record and most recent echocardiogram obtained 04/2019 demonstrated LVEF 50 to 55% with mild RV dysfunction.  She has been admitted to the hospital as recently as 03/2020 for volume overload and had home furosemide increased to 40 mg twice daily, but continued to have ongoing issues with edema and shortness of breath symptoms.  On my examination, patient is accompanied by her husband was at bedside.  Evidently she has been short of breath for 3 months, but has been progressively worsening over the course of the past week and a half.  She states that during that period of time, she endorses 10 pound weight gain.  She states that she feels very fluid overloaded.  She has also been coughing during that period of time with mild nasal congestion.  Denies any other symptoms of URI, no obvious sick contacts.  She also denies any fevers or chills.  She states that she tried using her albuterol, without relief.  She tells me that she takes furosemide 80 mg twice daily.  Patient also stating that her lower legs are increasingly weepy and swollen.  She states that the back of her legs are painful and discolored.  She sleeps at night using a ventilator(?) and a hospital bed that is upright.  She denies any fevers, chills, chest pain, abdominal pain, nausea or vomiting, changes in her bowel habits, or urinary symptoms.  Evidently she is ambulatory with a walker at  baseline, but states that she is having to stop to catch her breath.  She is chronically on 3 L supplemental oxygen.  HPI     Past Medical History:  Diagnosis Date  . Allergy   . Anemia   . Anxiety   . Asthma   . Atrial fibrillation ([place])   . Bipolar 1 disorder ([place])   . 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 Cholelithiasis    a. 09/2016 s/p Lap Chole.  . Chronic bronchitis ([place])   . Chronic combined systolic and diastolic congestive heart failure ([place])    a. 09/2016 Echo: EF 30-35%.  . Colon polyps   . COPD (chronic obstructive pulmonary disease) ([place])   . Depression   . Family history of adverse reaction to anesthesia    Uncle was positive for malignant hyperthermia; patient had testing done and was negative.  . Fibromyalgia   . GERD (gastroesophageal reflux disease)   . Gout   . High cholesterol   . History of blood transfusion 06/2015   "bleeding from my rectum"  . History of hiatal hernia   . HOH (hard of hearing)   . Hx of colonic polyps 03/21/2016   3 small adenomas no recall - co-morbidities  . Neuropathy    Disc Back   . NICM (nonischemic cardiomyopathy) ([place])    a. Previously worked up in  [place], [place]-->low EF with subsequent recovery.  Multiple caths (last ~ 2014 per pt report)--reportedly nl cors;  b. 09/2016 Echo: EF 30-35%, antsept/apical  HK, mild MR, mildly dil LA, mod dil RA;  c. 09/2016 Lexi MV: EF 26%, glob HK, sept DK, med size, mod intensity fixed septal defect - BBB/PVC related artifact, no ischemia.  . On home oxygen therapy    "3L; 24/7" (03/14/2016)  . OSA treated with BiPAP    uses biPAP, 10 (03/14/2016)  . Osteoarthritis   . Oxygen deficiency   . Pneumonia   . Type II diabetes mellitus [facility])     Patient Active Problem List   Diagnosis Date Noted  . Acute decompensated heart failure ([place]) 03/23/2020  . Hyperosmolar hyperglycemic state (HHS) ([place])   . Hypokalemia   . Diabetic hyperosmolar non-ketotic state ([place])   . Class 1  obesity due to excess calories with serious comorbidity and body mass index (BMI) of 34.0 to 34.9 in adult   . Hyperglycemia 03/20/2020  . 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/2020  . Vitreomacular adhesion of right eye 03/15/2020  . 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 Vision loss, central 03/15/2020  . Pseudophakia, both eyes 03/15/2020  . Gastroesophageal reflux disease   . Prolonged QT interval 04/27/2019  . CHF exacerbation ([place]) 01/24/2019  . Acute exacerbation of CHF (congestive heart failure) ([place]) 01/23/2019  . Gout 11/16/2017  . Chronic kidney disease 11/16/2017  . Uncontrolled type 2 diabetes mellitus with hyperglycemia ([place]) 11/12/2017  . Pain of left lower extremity 10/24/2017  . Medically complex patient 10/03/2017  . Chronic pain syndrome 10/03/2017  . Unstable gait 10/03/2017  . Diarrhea 07/29/2017  . Type 2 diabetes mellitus with stage 3 chronic kidney disease, with long-term current use of insulin ([place]) 04/01/2017  . Essential hypertension, benign 04/01/2017  . Mood disorder in conditions classified elsewhere 03/31/2017  . Chronic diastolic congestive heart failure ([place]) 03/12/2017  . Chronic systolic CHF (congestive heart failure) ([place]) 11/05/2016  . Pressure injury of skin 09/09/2016  . NICM (nonischemic cardiomyopathy) ([place])   . Obesity, Class III, BMI 40-49.9 (morbid obesity) ([place])   . Acute systolic congestive heart failure ([place])   . S/P laparoscopic cholecystectomy   . Heart failure ([place]) 09/06/2016  . Dyslipidemia 08/07/2016  . Vision loss 04/19/2016  . Hx of colonic polyps 03/21/2016  . Blood in stool   . Benign neoplasm of cecum   . Benign neoplasm of rectum   . CKD (chronic kidney disease), stage III ([place]) 03/14/2016  . Mixed hyperlipidemia 03/14/2016  . Scalp lesion 03/14/2016  . Peripheral polyneuropathy 01/26/2016  . Vitamin D deficiency 09/26/2015  . DM type 2 causing vascular disease ([place]) 08/28/2015  .  Chronic upper GI bleeding 06/24/2015  . Acute renal failure ([place]) 06/24/2015  . COPD with asthma ([place]) 02/22/2015  . OSA (obstructive sleep apnea) 08/22/2014  . Other emphysema ([place]) 05/29/2014  . Chronic hypoxic, hypercapnic respiratory failure 04/21/2014  . Microcytic anemia 04/21/2014  . Fibromyalgia 04/14/2014  . Uncontrolled secondary diabetes mellitus with stage 3 CKD (GFR 30-59) ([facility]) 04/14/2014  . Dyspnea on exertion 04/14/2014  . Acute on chronic combined systolic and diastolic CHF (congestive heart failure) ([place]) 04/14/2014  . Unspecified atrial fibrillation ([place]) 04/12/2014    Past Surgical History:  Procedure Laterality Date  . APPENDECTOMY     "they busted"  . bladder stimulator     pt states, "it cannot be turned off; it's in my right hip; dead battery so it's not working anymore". (03/14/2016)  . BLADDER SUSPENSION     2003, 2006 and 2010  . CATARACT EXTRACTION  W/PHACO Right 11/29/2014   Procedure: CATARACT EXTRACTION PHACO AND INTRAOCULAR LENS PLACEMENT (IOC);  Surgeon: Rutherford Guys, MD;  Location: [facility];  Service: Ophthalmology;  Laterality: Right;  CDE:3.81  . CATARACT EXTRACTION W/PHACO Left 12/13/2014   Procedure: CATARACT EXTRACTION PHACO AND INTRAOCULAR LENS PLACEMENT (IOC);  Surgeon: Rutherford Guys, MD;  Location: [facility];  Service: Ophthalmology;  Laterality: Left;  CDE:6.59  . CERVICAL DISC SURGERY N/A 2009   4, 6, and 7 cervical disc replaced  . CHOLECYSTECTOMY N/A 09/13/2016   Procedure: LAPAROSCOPIC CHOLECYSTECTOMY;  Surgeon: Rolm Bookbinder, MD;  Location: [facility];  Service: General;  Laterality: N/A;  . COLONOSCOPY WITH PROPOFOL N/A 03/15/2016   Procedure: COLONOSCOPY WITH PROPOFOL;  Surgeon: Gatha Mayer, MD;  Location: [facility];  Service: Endoscopy;  Laterality: N/A;  . ESOPHAGOGASTRODUODENOSCOPY (EGD) WITH PROPOFOL N/A 03/15/2016   Procedure: ESOPHAGOGASTRODUODENOSCOPY (EGD) WITH PROPOFOL;  Surgeon: Gatha Mayer, MD;  Location: [facility];   Service: Endoscopy;  Laterality: N/A;  . HEEL SPUR SURGERY Bilateral   . HERNIA REPAIR    . I & D EXTREMITY Right 06/13/2015   Procedure: MINOR IRRIGATION AND DEBRIDEMENT EXTREMITY REMOVAL OF NAIL;  Surgeon: Daryll Brod, MD;  Location: [facility];  Service: Orthopedics;  Laterality: Right;  . TUBAL LIGATION    . UMBILICAL HERNIA REPAIR     w/mesh     OB History   No obstetric history on file.     Family History  Problem Relation Age of Onset  . Heart disease Mother   . COPD Mother   . Diabetes Mother   . Breast cancer Mother   . Heart disease Father   . Hyperlipidemia Father   . COPD Sister   . Heart disease Sister   . Diabetes Sister   . Heart disease Maternal Grandmother   . Cancer Maternal Grandmother        stomach  . Cancer Maternal Grandfather        lung   . Bipolar disorder Brother     Social History   Tobacco Use  . Smoking status: Former    Packs/day: 2.00    Years: 14.00    Pack years: 28.00    Types: Cigarettes    Quit date: 04/12/1989    Years since quitting: 31.6  . Smokeless tobacco: Never  Vaping Use  . Vaping Use: Never used  Substance Use Topics  . Alcohol use: No    Alcohol/week: 0.0 standard drinks  . Drug use: No    Home Medications Prior to Admission medications   Medication Sig Start Date End Date Taking? Authorizing Provider  albuterol (PROVENTIL) (2.5 MG/3ML) 0.083% nebulizer solution Take 3 mLs (2.5 mg total) by nebulization every 6 (six) hours as needed for wheezing or shortness of breath. 02/17/20   Chesley Mires, MD  allopurinol (ZYLOPRIM) 100 MG tablet TAKE 1 TABLET BY MOUTH ONCE DAILY. Patient taking differently: Take 100 mg by mouth daily. 12/08/17   Steve Rattler, DO  apixaban (ELIQUIS) 5 MG TABS tablet Take 1 tablet (5 mg total) by mouth 2 (two) times daily. 10/04/20   Arnoldo Lenis, MD  atorvastatin (LIPITOR) 20 MG tablet Take 1 tablet (20 mg total) by mouth daily. Patient not taking: Reported on 09/05/2020  08/21/20 11/19/20  Verta Ellen., NP  budesonide (PULMICORT) 0.5 MG/2ML nebulizer solution Take 2 mLs (0.5 mg total) by nebulization 2 (two) times daily. 02/17/20   Chesley Mires, MD  cetirizine (ZYRTEC) 10 MG tablet  Take 10 mg by mouth daily. 04/06/20   [provider]  cholecalciferol (VITAMIN D) 400 units TABS tablet Take 1 tablet (400 Units total) by mouth 2 (two) times daily. 07/17/17   Rogue Bussing, MD  clobetasol cream (TEMOVATE) 4.25 % Apply 1 application topically daily.  01/28/19   [provider]  colchicine 0.6 MG tablet Take 0.6 mg by mouth daily as needed. gout    [provider]  FEROSUL 325 (65 Fe) MG tablet Take 325 mg by mouth daily.  01/08/19   [provider]  fluticasone (FLONASE) 50 MCG/ACT nasal spray Place 1 spray into both nostrils 2 (two) times daily.    [provider]  furosemide (LASIX) 40 MG tablet TAKE 3 TABLETS BY MOUTH TWICE DAILY 06/28/20   Hilty, Nadean Corwin, MD  Insulin Glargine (BASAGLAR KWIKPEN) 100 UNIT/ML Inject 20 Units into the skin 2 (two) times daily. Patient taking differently: No sig reported 03/27/20   Nita Sells, MD  insulin lispro (HUMALOG) 100 UNIT/ML KiwkPen Inject 0.02 mLs (2 Units total) into the skin 3 (three) times daily before meals. 03/27/20   Nita Sells, MD  ipratropium (ATROVENT) 0.02 % nebulizer solution Take 2.5 mLs (0.5 mg total) by nebulization every 6 (six) hours as needed for wheezing or shortness of breath. 02/17/20   Chesley Mires, MD  metolazone (ZAROXOLYN) 2.5 MG tablet Take 1.25 mg by mouth 3 (three) times a week. 09/28/20   [provider]  metoprolol succinate (TOPROL XL) 25 MG 24 hr tablet Take 1.5 tablets (37.5 mg total) by mouth daily. 06/07/20   Arnoldo Lenis, MD  mirtazapine (REMERON) 15 MG tablet Take 1 tablet (15 mg total) by mouth at bedtime. 10/24/17   Norman Clay, MD  modafinil (PROVIGIL) 200 MG tablet Take 1 tablet (200 mg total) by mouth  daily. 08/28/20   Chesley Mires, MD  montelukast (SINGULAIR) 10 MG tablet Take 1 tablet (10 mg total) by mouth at bedtime. 02/17/20   Chesley Mires, MD  oxyCODONE-acetaminophen (PERCOCET/ROXICET) 5-325 MG tablet Take 1 tablet by mouth 2 (two) times daily as needed for severe pain. 11/27/17   Rogue Bussing, MD  pantoprazole (PROTONIX) 20 MG tablet TAKE (1) TABLET BY MOUTH TWICE A DAY BEFORE MEALS. (BREAKFAST AND SUPPER) Patient taking differently: Take 20 mg by mouth 2 (two) times daily before a meal. 12/02/17   Riccio, Gardiner Rhyme, DO  primidone (MYSOLINE) 50 MG tablet TAKE ONE TABLET BY MOUTH AT BEDTIME. Patient taking differently: Take 50 mg by mouth at bedtime. 11/10/17   Rogue Bussing, MD  Probiotic Product (PROBIOTIC PO) Take 1 tablet by mouth at bedtime.    [provider]  revefenacin (YUPELRI) 175 MCG/3ML nebulizer solution Take 175 mcg by nebulization daily.    [provider]  TRUE METRIX BLOOD GLUCOSE TEST test strip USE TO TEST BLOOD SUGAR AS DIRECTED. 09/24/17   Cassandria Anger, MD  Vitamin A 2400 MCG (8000 UT) CAPS Take 8,000 Units by mouth every morning.     [provider]  Vitamin D, Ergocalciferol, (DRISDOL) 1.25 MG (50000 UNIT) CAPS capsule Take 1 capsule (50,000 Units total) by mouth every 7 (seven) days. 03/22/20   Barton Dubois, MD  potassium chloride 20 MEQ TBCR Take 20 mEq by mouth 2 (two) times daily. 05/02/19 07/02/19  Barton Dubois, MD    Allergies    Oseltamivir, Xarelto [rivaroxaban], Ancef [cefazolin], Levaquin [levofloxacin in d5w], Lyrica [pregabalin], Tamiflu [oseltamivir phosphate], Zoloft [sertraline hcl], Torsemide, Augmentin [amoxicillin-pot  clavulanate], Ciprofloxacin, Haldol [haloperidol], Nsaids, Penicillins, and Topamax [topiramate]  Review of Systems   Review of Systems  All other systems reviewed and are negative.  Physical Exam Updated Vital Signs BP 125/63   Pulse 90   Temp 98.1 F (36.7 C) (Oral)    Resp 19   SpO2 97%   Physical Exam Vitals and nursing note reviewed. Exam conducted with a chaperone present.  Constitutional:      Appearance: Normal appearance.  HENT:     Head: Normocephalic and atraumatic.  Eyes:     General: No scleral icterus.    Conjunctiva/sclera: Conjunctivae normal.  Cardiovascular:     Rate and Rhythm: Normal rate. Rhythm irregular.     Pulses: Normal pulses.     Comments: Atrial fibrillation (chronic). Pulmonary:     Breath sounds: Wheezing present.     Comments: No significant tachypnea.  Speaks in full sentences.  Mild wheezing auscultated diffusely on exam. Abdominal:     Comments: Mildly protuberant, no peritoneal signs.  Mild generalized TTP throughout.  No focal tenderness.  Musculoskeletal:     Right lower leg: Edema present.     Left lower leg: Edema present.     Comments: Lower extremity swelling, weeping, and evidence of chronic venous stasis dermatitis.  No induration or findings otherwise concerning for cellulitis.  Peripheral pulses intact and symmetric.  Sensation intact throughout.  Skin:    General: Skin is dry.  Neurological:     Mental Status: She is alert.     GCS: GCS eye subscore is 4. GCS verbal subscore is 5. GCS motor subscore is 6.  Psychiatric:        Mood and Affect: Mood normal.        Behavior: Behavior normal.        Thought Content: Thought content normal.    ED Results / Procedures / Treatments   Labs (all labs ordered are listed, but only abnormal results are displayed) Labs Reviewed  BRAIN NATRIURETIC PEPTIDE - Abnormal; Notable for the following components:      Result Value   B Natriuretic Peptide 1,105.0 (*)    All other components within normal limits  COMPREHENSIVE METABOLIC PANEL - Abnormal; Notable for the following components:   Chloride 96 (*)    Glucose, Bld 129 (*)    BUN 65 (*)    Creatinine, Ser 2.32 (*)    Calcium 8.0 (*)    Alkaline Phosphatase 171 (*)    GFR, Estimated 22 (*)    All  other components within normal limits  RESP PANEL BY RT-PCR (FLU A&B, COVID) ARPGX2  CBC WITH DIFFERENTIAL/PLATELET  TROPONIN I (HIGH SENSITIVITY)    EKG EKG Interpretation  Date/Time:  Monday November 13 2020 19:10:12 EDT Ventricular Rate:  93 PR Interval:    QRS Duration: 102 QT Interval:  387 QTC Calculation: 458 R Axis:   183 Text Interpretation: Atrial fibrillation Abnormal lateral Q waves Anterior infarct, old Premature ventricular complexes No significant change since last tracing Confirmed by Dorie Rank [phone]) on 11/13/2020 7:17:49 PM  Radiology DG Chest Portable 1 View  Result Date: 11/13/2020 CLINICAL DATA:  70 year old female with shortness of breath. EXAM: PORTABLE CHEST 1 VIEW COMPARISON:  Chest radiograph dated 05/19/2020. FINDINGS: Small right pleural effusion and right lung base atelectasis or infiltrate. Trace left pleural effusion may be present. There is no pneumothorax. There is mild cardiomegaly. No acute osseous pathology. Degenerative changes of the spine. Cervical ACDF. IMPRESSION: Small right pleural effusion and  right lung base atelectasis or infiltrate. Electronically Signed   By: Anner Crete M.D.   On: 11/13/2020 19:27    Procedures Procedures   Medications Ordered in ED Medications  ipratropium-albuterol (DUONEB) 0.5-2.5 (3) MG/3ML nebulizer solution 3 mL (has no administration in time range)  furosemide (LASIX) injection 80 mg (80 mg Intravenous Given 11/13/20 2104)  methylPREDNISolone sodium succinate (SOLU-MEDROL) 125 mg/2 mL injection 80 mg (80 mg Intravenous Given 11/13/20 2212)    ED Course  I have reviewed the triage vital signs and the nursing notes.  Pertinent labs & imaging results that were available during my care of the patient were reviewed by me and considered in my medical decision making (see chart for details).  Clinical Course as of 11/13/20 2353  Mon Nov 13, 2020  2353 Spoke with Dr. Clearence Ped who will admit patient. [GG]     Clinical Course User Index [GG] Corena Herter, PA-C   MDM Rules/Calculators/A&P                          Konica Stankowski Malecha was evaluated in Emergency Department on 11/13/2020 for the symptoms described in the history of present illness. She was evaluated in the context of the global COVID-19 pandemic, which necessitated consideration that the patient might be at risk for infection with the SARS-CoV-2 virus that causes COVID-19. Institutional protocols and algorithms that pertain to the evaluation of patients at risk for COVID-19 are in a state of rapid change based on information released by regulatory bodies including the CDC and federal and state organizations. These policies and algorithms were followed during the patient's care in the ED.  I personally reviewed patient's medical chart and all notes from triage and staff during today's encounter. I have also ordered and reviewed all labs and imaging that I felt to be medically necessary in the evaluation of this patient's complaints and with consideration of their physical exam. If needed, translation services were available and utilized.   Patient in the ED with combination CHF and COPD exacerbation.  She has been coughing for the past week and a half, suspect due to fluid overloaded status.  She endorses a 10 pound weight gain during that period of time.  She is wheezing on my exam.  She does have a history of COPD, no relief with at home albuterol.  Suspect that she will need to be treated with IV diuresis, albuterol, and may even require admission to the hospital for ongoing management.  Plain films of chest are personally reviewed and demonstrate a small right pleural effusion and right lung base atelectasis versus infiltrate.  CMP with mildly worsening chronic CKD with creatinine elevated 2.32 and GFR reduced 22.  This is worsening when compared to most recent labs obtained 5 months ago.  Troponin within normal meds at 15, do not need to trend  given chronicity and lack of any chest pain symptoms.  CBC without anemia or leukocytosis concerning for infection.  BNP is elevated at 1105.  Attempted to treat patient symptoms with steroids, albuterol, and Lasix.  She declined the albuterol here in the ED.  On subsequent evaluation, patient states that she declined the albuterol because the inhaler gives her thrush, but she is willing to do the nebulizer treatment.  We will initiate DuoNeb treatment.  She is still wheezing on my exam despite steroids.  She also has not been able to put out much fluid despite the 80 mg IV  furosemide here in the ED.  She continues to feel short of breath.  Will consult hospitalist for admission.  While she does have cough and questionable pneumonia on chest x-ray, suspect that her cough is in the context of mild fluid overload superimposed on COPD exacerbation given her weight gain and wheezing.  She has been afebrile and there is no leukocytosis.  Will abstain from antibiotics at this time.  Would like her to be admitted for continued diuresis and management of her wheezing.  Final Clinical Impression(s) / ED Diagnoses Final diagnoses:  Acute on chronic combined systolic and diastolic congestive heart failure ([place])  Wheezing  Venous insufficiency    Rx / DC Orders ED Discharge Orders     None        Reita Chard 11/13/20 2353    Dorie Rank, MD 11/14/20 1018

## 2020-11-13 NOTE — ED Triage Notes (Signed)
BIB [facility] c/o SOB x 3 weeks, worse today. Says she's been putting on fluid the last couple days. Hx of CHF.

## 2020-11-14 ENCOUNTER — Other Ambulatory Visit: Payer: Self-pay

## 2020-11-14 ENCOUNTER — Ambulatory Visit: Payer: Self-pay | Admitting: *Deleted

## 2020-11-14 ENCOUNTER — Other Ambulatory Visit (HOSPITAL_COMMUNITY): Payer: Medicare Other

## 2020-11-14 DIAGNOSIS — E1159 Type 2 diabetes mellitus with other circulatory complications: Secondary | ICD-10-CM | POA: Diagnosis present

## 2020-11-14 DIAGNOSIS — E782 Mixed hyperlipidemia: Secondary | ICD-10-CM | POA: Diagnosis present

## 2020-11-14 DIAGNOSIS — E876 Hypokalemia: Secondary | ICD-10-CM | POA: Diagnosis present

## 2020-11-14 DIAGNOSIS — R0602 Shortness of breath: Secondary | ICD-10-CM | POA: Diagnosis not present

## 2020-11-14 DIAGNOSIS — J441 Chronic obstructive pulmonary disease with (acute) exacerbation: Secondary | ICD-10-CM | POA: Diagnosis present

## 2020-11-14 DIAGNOSIS — I130 Hypertensive heart and chronic kidney disease with heart failure and stage 1 through stage 4 chronic kidney disease, or unspecified chronic kidney disease: Secondary | ICD-10-CM | POA: Diagnosis present

## 2020-11-14 DIAGNOSIS — E1122 Type 2 diabetes mellitus with diabetic chronic kidney disease: Secondary | ICD-10-CM | POA: Diagnosis present

## 2020-11-14 DIAGNOSIS — E11649 Type 2 diabetes mellitus with hypoglycemia without coma: Secondary | ICD-10-CM | POA: Diagnosis present

## 2020-11-14 DIAGNOSIS — N1831 Chronic kidney disease, stage 3a: Secondary | ICD-10-CM | POA: Diagnosis present

## 2020-11-14 DIAGNOSIS — I509 Heart failure, unspecified: Secondary | ICD-10-CM | POA: Diagnosis not present

## 2020-11-14 DIAGNOSIS — I4891 Unspecified atrial fibrillation: Secondary | ICD-10-CM | POA: Diagnosis present

## 2020-11-14 DIAGNOSIS — J9611 Chronic respiratory failure with hypoxia: Secondary | ICD-10-CM | POA: Diagnosis present

## 2020-11-14 DIAGNOSIS — J449 Chronic obstructive pulmonary disease, unspecified: Secondary | ICD-10-CM | POA: Diagnosis not present

## 2020-11-14 DIAGNOSIS — Z6841 Body Mass Index (BMI) 40.0 and over, adult: Secondary | ICD-10-CM | POA: Diagnosis not present

## 2020-11-14 DIAGNOSIS — E7800 Pure hypercholesterolemia, unspecified: Secondary | ICD-10-CM | POA: Diagnosis present

## 2020-11-14 DIAGNOSIS — R0600 Dyspnea, unspecified: Secondary | ICD-10-CM | POA: Diagnosis not present

## 2020-11-14 DIAGNOSIS — N179 Acute kidney failure, unspecified: Secondary | ICD-10-CM | POA: Diagnosis present

## 2020-11-14 DIAGNOSIS — I5033 Acute on chronic diastolic (congestive) heart failure: Secondary | ICD-10-CM | POA: Diagnosis not present

## 2020-11-14 DIAGNOSIS — E1129 Type 2 diabetes mellitus with other diabetic kidney complication: Secondary | ICD-10-CM | POA: Diagnosis not present

## 2020-11-14 DIAGNOSIS — I5042 Chronic combined systolic (congestive) and diastolic (congestive) heart failure: Secondary | ICD-10-CM | POA: Diagnosis not present

## 2020-11-14 DIAGNOSIS — Z20822 Contact with and (suspected) exposure to covid-19: Secondary | ICD-10-CM | POA: Diagnosis present

## 2020-11-14 DIAGNOSIS — J9612 Chronic respiratory failure with hypercapnia: Secondary | ICD-10-CM | POA: Diagnosis present

## 2020-11-14 DIAGNOSIS — I5043 Acute on chronic combined systolic (congestive) and diastolic (congestive) heart failure: Secondary | ICD-10-CM | POA: Diagnosis present

## 2020-11-14 DIAGNOSIS — G4733 Obstructive sleep apnea (adult) (pediatric): Secondary | ICD-10-CM | POA: Diagnosis present

## 2020-11-14 DIAGNOSIS — E875 Hyperkalemia: Secondary | ICD-10-CM | POA: Diagnosis present

## 2020-11-14 DIAGNOSIS — I428 Other cardiomyopathies: Secondary | ICD-10-CM | POA: Diagnosis present

## 2020-11-14 DIAGNOSIS — Z5329 Procedure and treatment not carried out because of patient's decision for other reasons: Secondary | ICD-10-CM | POA: Diagnosis present

## 2020-11-14 DIAGNOSIS — K219 Gastro-esophageal reflux disease without esophagitis: Secondary | ICD-10-CM | POA: Diagnosis present

## 2020-11-14 DIAGNOSIS — F319 Bipolar disorder, unspecified: Secondary | ICD-10-CM | POA: Diagnosis present

## 2020-11-14 LAB — COMPREHENSIVE METABOLIC PANEL
ALT: 14 U/L (ref 0–44)
AST: 27 U/L (ref 15–41)
Albumin: 3.3 g/dL — ABNORMAL LOW (ref 3.5–5.0)
Alkaline Phosphatase: 172 U/L — ABNORMAL HIGH (ref 38–126)
Anion gap: 11 (ref 5–15)
BUN: 68 mg/dL — ABNORMAL HIGH (ref 8–23)
CO2: 31 mmol/L (ref 22–32)
Calcium: 8.6 mg/dL — ABNORMAL LOW (ref 8.9–10.3)
Chloride: 97 mmol/L — ABNORMAL LOW (ref 98–111)
Creatinine, Ser: 2.4 mg/dL — ABNORMAL HIGH (ref 0.44–1.00)
GFR, Estimated: 21 mL/min — ABNORMAL LOW (ref >60)
Glucose, Bld: 156 mg/dL — ABNORMAL HIGH (ref 70–99)
Potassium: 5.1 mmol/L (ref 3.5–5.1)
Sodium: 139 mmol/L (ref 135–145)
Total Bilirubin: 1 mg/dL (ref 0.3–1.2)
Total Protein: 6.7 g/dL (ref 6.5–8.1)

## 2020-11-14 LAB — URINALYSIS, COMPLETE (UACMP) WITH MICROSCOPIC
Bacteria, UA: NONE SEEN
Bilirubin Urine: NEGATIVE
Glucose, UA: NEGATIVE mg/dL
Hgb urine dipstick: NEGATIVE
Ketones, ur: NEGATIVE mg/dL
Nitrite: NEGATIVE
Protein, ur: NEGATIVE mg/dL
Specific Gravity, Urine: 1.012 (ref 1.005–1.030)
pH: 5 (ref 5.0–8.0)

## 2020-11-14 LAB — GLUCOSE, CAPILLARY
Glucose-Capillary: 154 mg/dL — ABNORMAL HIGH (ref 70–99)
Glucose-Capillary: 121 mg/dL — ABNORMAL HIGH (ref 70–99)
Glucose-Capillary: 207 mg/dL — ABNORMAL HIGH (ref 70–99)
Glucose-Capillary: 155 mg/dL — ABNORMAL HIGH (ref 70–99)
Glucose-Capillary: 174 mg/dL — ABNORMAL HIGH (ref 70–99)

## 2020-11-14 LAB — SODIUM, URINE, RANDOM: Sodium, Ur: 12 mmol/L

## 2020-11-14 LAB — CREATININE, URINE, RANDOM: Creatinine, Urine: 111.38 mg/dL

## 2020-11-14 LAB — CBC
HCT: 41.8 % (ref 36.0–46.0)
Hemoglobin: 12.4 g/dL (ref 12.0–15.0)
MCH: 29.7 pg (ref 26.0–34.0)
MCHC: 29.7 g/dL — ABNORMAL LOW (ref 30.0–36.0)
MCV: 100 fL (ref 80.0–100.0)
Platelets: 171 10*3/uL (ref 150–400)
RBC: 4.18 MIL/uL (ref 3.87–5.11)
RDW: 15.2 % (ref 11.5–15.5)
WBC: 6.9 10*3/uL (ref 4.0–10.5)
nRBC: 0 % (ref 0.0–0.2)

## 2020-11-14 LAB — BLOOD GAS, VENOUS
Acid-Base Excess: 5.2 mmol/L — ABNORMAL HIGH (ref 0.0–2.0)
Bicarbonate: 26.6 mmol/L (ref 20.0–28.0)
FIO2: 32
O2 Saturation: 73.4 %
Patient temperature: 36.7
pCO2, Ven: 75.9 mmHg (ref 44.0–60.0)
pH, Ven: 7.248 — ABNORMAL LOW (ref 7.250–7.430)
pO2, Ven: 44.4 mmHg (ref 32.0–45.0)

## 2020-11-14 LAB — PROTEIN / CREATININE RATIO, URINE
Creatinine, Urine: 110.8 mg/dL
Protein Creatinine Ratio: 0.06 mg/mg{Cre} (ref 0.00–0.15)
Total Protein, Urine: 7 mg/dL

## 2020-11-14 MED ORDER — ACETAMINOPHEN 325 MG PO TABS: 650.0000 mg | ORAL_TABLET | Freq: Four times a day (QID) | ORAL | Status: DC | PRN

## 2020-11-14 MED ORDER — FUROSEMIDE 10 MG/ML IJ SOLN
120.0000 mg | Freq: Four times a day (QID) | INTRAVENOUS | Status: DC
  Administered 2020-11-14 – 2020-11-15 (×4): 120 mg via INTRAVENOUS
  Filled 2020-11-14 (×9): qty 12

## 2020-11-14 MED ORDER — ATORVASTATIN CALCIUM 20 MG PO TABS
20.0000 mg | ORAL_TABLET | Freq: Every day | ORAL | Status: DC
  Administered 2020-11-14 – 2020-11-19 (×6): 20 mg via ORAL
  Filled 2020-11-14 (×6): qty 1

## 2020-11-14 MED ORDER — IPRATROPIUM-ALBUTEROL 0.5-2.5 (3) MG/3ML IN SOLN
3.0000 mL | Freq: Four times a day (QID) | RESPIRATORY_TRACT | Status: DC
  Administered 2020-11-14 (×3): 3 mL via RESPIRATORY_TRACT
  Filled 2020-11-14 (×3): qty 3

## 2020-11-14 MED ORDER — INSULIN ASPART 100 UNIT/ML IJ SOLN: 0.0000 [IU] | Freq: Every day | INTRAMUSCULAR | Status: DC

## 2020-11-14 MED ORDER — IPRATROPIUM-ALBUTEROL 0.5-2.5 (3) MG/3ML IN SOLN
3.0000 mL | Freq: Three times a day (TID) | RESPIRATORY_TRACT | Status: DC
  Administered 2020-11-15 – 2020-11-18 (×9): 3 mL via RESPIRATORY_TRACT
  Filled 2020-11-14 (×7): qty 3
  Filled 2020-11-14: qty 6
  Filled 2020-11-14 (×2): qty 3

## 2020-11-14 MED ORDER — BUDESONIDE 0.5 MG/2ML IN SUSP
0.5000 mg | Freq: Two times a day (BID) | RESPIRATORY_TRACT | Status: DC
  Administered 2020-11-14 (×2): 0.5 mg via RESPIRATORY_TRACT
  Filled 2020-11-14 (×2): qty 2

## 2020-11-14 MED ORDER — INSULIN GLARGINE 100 UNIT/ML ~~LOC~~ SOLN
8.0000 [IU] | Freq: Every day | SUBCUTANEOUS | Status: DC
  Administered 2020-11-14 – 2020-11-18 (×5): 8 [IU] via SUBCUTANEOUS
  Filled 2020-11-14 (×6): qty 0.08

## 2020-11-14 MED ORDER — ONDANSETRON HCL 4 MG PO TABS: 4.0000 mg | ORAL_TABLET | Freq: Four times a day (QID) | ORAL | Status: DC | PRN

## 2020-11-14 MED ORDER — LIVING BETTER WITH HEART FAILURE BOOK: Freq: Once | Status: DC

## 2020-11-14 MED ORDER — IPRATROPIUM BROMIDE 0.02 % IN SOLN: 0.5000 mg | Freq: Four times a day (QID) | RESPIRATORY_TRACT | Status: DC | PRN

## 2020-11-14 MED ORDER — OXYCODONE HCL 5 MG PO TABS
5.0000 mg | ORAL_TABLET | ORAL | Status: DC | PRN
  Administered 2020-11-19: 5 mg via ORAL
  Filled 2020-11-14 (×2): qty 1

## 2020-11-14 MED ORDER — FUROSEMIDE 10 MG/ML IJ SOLN: 80.0000 mg | Freq: Two times a day (BID) | INTRAMUSCULAR | Status: DC

## 2020-11-14 MED ORDER — ACETAMINOPHEN 650 MG RE SUPP: 650.0000 mg | Freq: Four times a day (QID) | RECTAL | Status: DC | PRN

## 2020-11-14 MED ORDER — METHYLPREDNISOLONE SODIUM SUCC 125 MG IJ SOLR
60.0000 mg | Freq: Two times a day (BID) | INTRAMUSCULAR | Status: DC
  Administered 2020-11-14 – 2020-11-15 (×4): 60 mg via INTRAVENOUS
  Filled 2020-11-14 (×4): qty 2

## 2020-11-14 MED ORDER — OXYCODONE-ACETAMINOPHEN 5-325 MG PO TABS
1.0000 | ORAL_TABLET | Freq: Two times a day (BID) | ORAL | Status: DC | PRN
Start: 2020-11-14 — End: 2020-11-19
  Administered 2020-11-14 – 2020-11-19 (×10): 1 via ORAL
  Filled 2020-11-14 (×10): qty 1

## 2020-11-14 MED ORDER — ONDANSETRON HCL 4 MG/2ML IJ SOLN: 4.0000 mg | Freq: Four times a day (QID) | INTRAMUSCULAR | Status: DC | PRN

## 2020-11-14 MED ORDER — METOPROLOL SUCCINATE ER 25 MG PO TB24
37.5000 mg | ORAL_TABLET | Freq: Every day | ORAL | Status: DC
  Administered 2020-11-14 – 2020-11-19 (×5): 37.5 mg via ORAL
  Filled 2020-11-14 (×7): qty 2

## 2020-11-14 MED ORDER — BUDESONIDE 0.5 MG/2ML IN SUSP
0.5000 mg | Freq: Two times a day (BID) | RESPIRATORY_TRACT | Status: DC
  Administered 2020-11-15 – 2020-11-19 (×9): 0.5 mg via RESPIRATORY_TRACT
  Filled 2020-11-14 (×9): qty 2

## 2020-11-14 MED ORDER — ALBUTEROL SULFATE (2.5 MG/3ML) 0.083% IN NEBU: 2.5000 mg | INHALATION_SOLUTION | RESPIRATORY_TRACT | Status: DC | PRN

## 2020-11-14 MED ORDER — PANTOPRAZOLE SODIUM 40 MG PO TBEC
40.0000 mg | DELAYED_RELEASE_TABLET | Freq: Every day | ORAL | Status: DC
  Administered 2020-11-14 – 2020-11-19 (×6): 40 mg via ORAL
  Filled 2020-11-14 (×6): qty 1

## 2020-11-14 MED ORDER — METOLAZONE 5 MG PO TABS
2.5000 mg | ORAL_TABLET | Freq: Every day | ORAL | Status: DC
  Administered 2020-11-14 – 2020-11-15 (×2): 2.5 mg via ORAL
  Filled 2020-11-14 (×2): qty 1

## 2020-11-14 MED ORDER — MONTELUKAST SODIUM 10 MG PO TABS
10.0000 mg | ORAL_TABLET | Freq: Every day | ORAL | Status: DC
  Administered 2020-11-14 – 2020-11-18 (×5): 10 mg via ORAL
  Filled 2020-11-14 (×5): qty 1

## 2020-11-14 MED ORDER — ALBUTEROL SULFATE (2.5 MG/3ML) 0.083% IN NEBU: 2.5000 mg | INHALATION_SOLUTION | Freq: Four times a day (QID) | RESPIRATORY_TRACT | Status: DC | PRN

## 2020-11-14 MED ORDER — APIXABAN 5 MG PO TABS
5.0000 mg | ORAL_TABLET | Freq: Two times a day (BID) | ORAL | Status: DC
  Administered 2020-11-14 – 2020-11-19 (×11): 5 mg via ORAL
  Filled 2020-11-14 (×12): qty 1

## 2020-11-14 MED ORDER — PRIMIDONE 50 MG PO TABS
50.0000 mg | ORAL_TABLET | Freq: Every day | ORAL | Status: DC
  Administered 2020-11-14 – 2020-11-18 (×5): 50 mg via ORAL
  Filled 2020-11-14 (×5): qty 1

## 2020-11-14 MED ORDER — INSULIN ASPART 100 UNIT/ML IJ SOLN
0.0000 [IU] | Freq: Three times a day (TID) | INTRAMUSCULAR | Status: DC
  Administered 2020-11-14: 2 [IU] via SUBCUTANEOUS
  Administered 2020-11-14: 5 [IU] via SUBCUTANEOUS
  Administered 2020-11-14 – 2020-11-15 (×2): 3 [IU] via SUBCUTANEOUS
  Administered 2020-11-15 – 2020-11-16 (×2): 5 [IU] via SUBCUTANEOUS
  Administered 2020-11-16 – 2020-11-19 (×9): 3 [IU] via SUBCUTANEOUS
  Administered 2020-11-19: 2 [IU] via SUBCUTANEOUS

## 2020-11-14 NOTE — Consult Note (Signed)
Reason for Consult:AKI/CKD stage IIIa Referring Physician: Hyman Bower Denise Freeman is an 70 y.o. female with a PMH significant for COPD, DM type 2, HTN, chronic combined systolic and diastolic CHF (EF 44% and grade II DD), obesity, atrial fibrillation on anticoagulation, and CKD stage IIIa (followed by [name] with baseline Scr of 1.8-2) who presented to [facility] ED via EMS with a 3 week history of worsening lower extremity edema, 10 lb weight gain, SOB, orthopnea, and DOE.  In the ED she was noted to be struggling with her breathing, VSS, SpO2 97% on 3 liters via [place].  Labs were notable for Scr of 2.32, BUN 65, BNP 1105.  CXR with small right pleural effusion and mild cardiomegaly.  She was admitted for IV diuresis for acute on chronic combined systolic and diastolic CHF.  We were consulted because she has not responded to iv lasix and her BUN/Cr have continued to climb.  The patient reports that she has called her PCP and cardiology regarding the worsening edema and did have her furosemide increased to 120 mg bid and metolazone 3 times per week without significant improvement of her edema.  According to records she was taken off of metolazone in December after her Scr rose to 3.12 and only recently restarted.  She denies any N/V/D/CP or change in medications or diet prior to the onset of her worsening edema.   Trend in Creatinine: Creatinine, Ser  Date/Time Value Ref Range Status  11/14/2020 05:38 AM 2.40 (H) 0.44 - 1.00 mg/dL Final  11/13/2020 07:52 PM 2.32 (H) 0.44 - 1.00 mg/dL Final  06/08/2020 11:48 AM 1.76 (H) 0.50 - 0.99 mg/dL Final  03/27/2020 07:21 AM 1.99 (H) 0.44 - 1.00 mg/dL Final  03/26/2020 06:08 AM 2.07 (H) 0.44 - 1.00 mg/dL Final  03/25/2020 07:09 AM 2.00 (H) 0.44 - 1.00 mg/dL Final  03/24/2020 11:19 AM 1.78 (H) 0.44 - 1.00 mg/dL Final  03/23/2020 07:30 AM 1.70 (H) 0.44 - 1.00 mg/dL Final  03/22/2020 03:07 AM 1.71 (H) 0.44 - 1.00 mg/dL Final  03/21/2020 07:03 AM 2.03 (H) 0.44 -  1.00 mg/dL Final  03/21/2020 02:04 AM 2.02 (H) 0.44 - 1.00 mg/dL Final  03/20/2020 06:42 PM 2.73 (H) 0.44 - 1.00 mg/dL Final  03/20/2020 04:04 PM 2.85 (H) 0.44 - 1.00 mg/dL Final  05/02/2019 06:36 AM 1.09 (H) 0.44 - 1.00 mg/dL Final  05/01/2019 05:58 AM 1.16 (H) 0.44 - 1.00 mg/dL Final  04/30/2019 05:02 AM 1.15 (H) 0.44 - 1.00 mg/dL Final  04/29/2019 05:07 AM 1.31 (H) 0.44 - 1.00 mg/dL Final  04/28/2019 04:37 AM 1.15 (H) 0.44 - 1.00 mg/dL Final  04/27/2019 05:16 PM 1.22 (H) 0.44 - 1.00 mg/dL Final  02/15/2019 12:02 PM 1.44 (H) 0.57 - 1.00 mg/dL Final  01/25/2019 04:52 AM 1.29 (H) 0.44 - 1.00 mg/dL Final  01/24/2019 06:47 AM 1.19 (H) 0.44 - 1.00 mg/dL Final  01/23/2019 10:26 PM 1.15 (H) 0.44 - 1.00 mg/dL Final  01/22/2019 06:22 PM 1.12 (H) 0.44 - 1.00 mg/dL Final  12/03/2017 09:57 AM 1.45 (H) 0.57 - 1.00 mg/dL Final  11/27/2017 02:41 PM 1.37 (H) 0.57 - 1.00 mg/dL Final  11/13/2017 03:13 PM 1.44 (H) 0.57 - 1.00 mg/dL Final  10/08/2017 01:43 PM 1.56 (H) 0.44 - 1.00 mg/dL Final  07/29/2017 11:15 AM 1.67 (H) 0.57 - 1.00 mg/dL Final  06/11/2017 11:51 AM 1.69 (H) 0.57 - 1.00 mg/dL Final  01/08/2017 11:45 AM 1.12 (H) 0.57 - 1.00 mg/dL Final  12/09/2016 11:50 AM 1.24 (  H) 0.57 - 1.00 mg/dL Final  11/04/2016 03:26 PM 1.35 (H) 0.57 - 1.00 mg/dL Final  09/17/2016 03:46 PM 0.99 0.57 - 1.00 mg/dL Final  09/14/2016 04:50 AM 1.47 (H) 0.44 - 1.00 mg/dL Final  09/13/2016 07:11 PM 1.35 (H) 0.44 - 1.00 mg/dL Final  09/12/2016 03:06 AM 1.61 (H) 0.44 - 1.00 mg/dL Final  09/11/2016 03:19 AM 2.07 (H) 0.44 - 1.00 mg/dL Final  09/10/2016 11:47 AM 2.00 (H) 0.44 - 1.00 mg/dL Final  09/09/2016 05:12 AM 1.58 (H) 0.44 - 1.00 mg/dL Final  09/08/2016 02:42 AM 1.37 (H) 0.44 - 1.00 mg/dL Final  09/07/2016 07:07 AM 1.27 (H) 0.44 - 1.00 mg/dL Final  09/06/2016 03:27 PM 1.27 (H) 0.44 - 1.00 mg/dL Final  06/27/2016 04:30 PM 1.17 0.40 - 1.20 mg/dL Final  04/19/2016 09:32 AM 1.33 (H) 0.40 - 1.20 mg/dL Final  03/14/2016  07:07 PM 1.38 (H) 0.44 - 1.00 mg/dL Final  01/26/2016 10:58 AM 1.19 0.40 - 1.20 mg/dL Final  09/26/2015 02:16 PM 1.28 (H) 0.40 - 1.20 mg/dL Final  06/26/2015 09:12 AM 1.30 (H) 0.44 - 1.00 mg/dL Final  06/25/2015 09:22 AM 1.42 (H) 0.44 - 1.00 mg/dL Final  06/24/2015 05:51 PM 1.78 (H) 0.44 - 1.00 mg/dL Final  03/30/2015 04:32 PM 1.07 (H) 0.44 - 1.00 mg/dL Final  02/20/2015 06:13 PM 1.11 (H) 0.44 - 1.00 mg/dL Final    PMH:   Past Medical History:  Diagnosis Date   Allergy    Anemia    Anxiety    Asthma    Atrial fibrillation ([place])    Bipolar 1 disorder ([place])    Blind    "partially in both eyes" (03/14/2016)   Cholelithiasis    a. 09/2016 s/p Lap Chole.   Chronic bronchitis (HCC)    Chronic combined systolic and diastolic congestive heart failure ([place])    a. 09/2016 Echo: EF 30-35%.   Colon polyps    COPD (chronic obstructive pulmonary disease) (HCC)    Depression    Family history of adverse reaction to anesthesia    Uncle was positive for malignant hyperthermia; patient had testing done and was negative.   Fibromyalgia    GERD (gastroesophageal reflux disease)    Gout    High cholesterol    History of blood transfusion 06/2015   "bleeding from my rectum"   History of hiatal hernia    HOH (hard of hearing)    Hx of colonic polyps 03/21/2016   3 small adenomas no recall - co-morbidities   Neuropathy    Disc Back    NICM (nonischemic cardiomyopathy) ([place])    a. Previously worked up in [place], MD-->low EF with subsequent recovery.  Multiple caths (last ~ 2014 per pt report)--reportedly nl cors;  b. 09/2016 Echo: EF 30-35%, antsept/apical HK, mild MR, mildly dil LA, mod dil RA;  c. 09/2016 Lexi MV: EF 26%, glob HK, sept DK, med size, mod intensity fixed septal defect - BBB/PVC related artifact, no ischemia.   On home oxygen therapy    "3L; 24/7" (03/14/2016)   OSA treated with BiPAP    uses biPAP, 10 (03/14/2016)   Osteoarthritis    Oxygen deficiency    Pneumonia    Type  II diabetes mellitus (HCC)     PSH:   Past Surgical History:  Procedure Laterality Date   APPENDECTOMY     "they busted"   bladder stimulator     pt states, "it cannot be turned off; it's in my right hip; dead battery  so it's not working anymore". (03/14/2016)   BLADDER SUSPENSION     2003, 2006 and 2010   CATARACT EXTRACTION W/PHACO Right 11/29/2014   Procedure: CATARACT EXTRACTION PHACO AND INTRAOCULAR LENS PLACEMENT (IOC);  Surgeon: Rutherford Guys, MD;  Location: [facility];  Service: Ophthalmology;  Laterality: Right;  CDE:3.81   CATARACT EXTRACTION W/PHACO Left 12/13/2014   Procedure: CATARACT EXTRACTION PHACO AND INTRAOCULAR LENS PLACEMENT (IOC);  Surgeon: Rutherford Guys, MD;  Location: [facility];  Service: Ophthalmology;  Laterality: Left;  CDE:6.59   CERVICAL DISC SURGERY N/A 2009   4, 6, and 7 cervical disc replaced   CHOLECYSTECTOMY N/A 09/13/2016   Procedure: LAPAROSCOPIC CHOLECYSTECTOMY;  Surgeon: Rolm Bookbinder, MD;  Location: [place];  Service: General;  Laterality: N/A;   COLONOSCOPY WITH PROPOFOL N/A 03/15/2016   Procedure: COLONOSCOPY WITH PROPOFOL;  Surgeon: Gatha Mayer, MD;  Location: [place];  Service: Endoscopy;  Laterality: N/A;   ESOPHAGOGASTRODUODENOSCOPY (EGD) WITH PROPOFOL N/A 03/15/2016   Procedure: ESOPHAGOGASTRODUODENOSCOPY (EGD) WITH PROPOFOL;  Surgeon: Gatha Mayer, MD;  Location: [place];  Service: Endoscopy;  Laterality: N/A;   HEEL SPUR SURGERY Bilateral    HERNIA REPAIR     I & D EXTREMITY Right 06/13/2015   Procedure: MINOR IRRIGATION AND DEBRIDEMENT EXTREMITY REMOVAL OF NAIL;  Surgeon: Daryll Brod, MD;  Location: [place];  Service: Orthopedics;  Laterality: Right;   TUBAL LIGATION     UMBILICAL HERNIA REPAIR     w/mesh    Allergies:  Allergies  Allergen Reactions   Oseltamivir Hives    Other reaction(s): Other (see comments) "water blisters"   Xarelto [Rivaroxaban] Other (See Comments)    Internal bleeding   Ancef  [Cefazolin] Nausea And Vomiting   Levaquin [Levofloxacin In D5w] Other (See Comments)    "afib"   Lyrica [Pregabalin] Hives   Tamiflu [Oseltamivir Phosphate] Other (See Comments)    "water blisters"   Zoloft [Sertraline Hcl] Other (See Comments)    Jaw problems, jittery   Torsemide Other (See Comments)    Not effective   Augmentin [Amoxicillin-Pot Clavulanate] Itching   Ciprofloxacin Itching and Nausea And Vomiting   Haldol [Haloperidol] Other (See Comments)    Restless leg   Nsaids Diarrhea   Penicillins Itching, Nausea And Vomiting and Rash    Has patient had a PCN reaction causing immediate rash, facial/tongue/throat swelling, SOB or lightheadedness with hypotension: Yes Has patient had a PCN reaction causing severe rash involving mucus membranes or skin necrosis: No Has patient had a PCN reaction that required hospitalization No Has patient had a PCN reaction occurring within the last 10 years: Yes If all of the above answers are "NO", then may proceed with Cephalosporin use.   Topamax [Topiramate] Nausea Only    Medications:   Prior to Admission medications   Medication Sig Start Date End Date Taking? Authorizing Provider  albuterol (PROVENTIL) (2.5 MG/3ML) 0.083% nebulizer solution Take 3 mLs (2.5 mg total) by nebulization every 6 (six) hours as needed for wheezing or shortness of breath. 02/17/20  Yes Chesley Mires, MD  allopurinol (ZYLOPRIM) 100 MG tablet TAKE 1 TABLET BY MOUTH ONCE DAILY. Patient taking differently: Take 100 mg by mouth daily. 12/08/17  Yes Riccio, Gardiner Rhyme, DO  apixaban (ELIQUIS) 5 MG TABS tablet Take 1 tablet (5 mg total) by mouth 2 (two) times daily. 10/04/20  Yes BranchAlphonse Guild, MD  atorvastatin (LIPITOR) 20 MG tablet Take 1 tablet (20 mg total) by mouth daily. 08/21/20 11/19/20 Yes Leonides Sake,  Lavetta Nielsen., NP  budesonide (PULMICORT) 0.5 MG/2ML nebulizer solution Take 2 mLs (0.5 mg total) by nebulization 2 (two) times daily. 02/17/20  Yes Chesley Mires, MD   cetirizine (ZYRTEC) 10 MG tablet Take 10 mg by mouth daily. 04/06/20  Yes [provider]  cholecalciferol (VITAMIN D) 400 units TABS tablet Take 1 tablet (400 Units total) by mouth 2 (two) times daily. 07/17/17  Yes Rogue Bussing, MD  clobetasol cream (TEMOVATE) 2.99 % Apply 1 application topically daily.  01/28/19  Yes [provider]  colchicine 0.6 MG tablet Take 0.6 mg by mouth daily as needed. gout   Yes [provider]  escitalopram (LEXAPRO) 10 MG tablet Take 1 tablet by mouth daily. 10/27/20  Yes [provider]  FEROSUL 325 (65 Fe) MG tablet Take 325 mg by mouth daily.  01/08/19  Yes [provider]  fluticasone (FLONASE) 50 MCG/ACT nasal spray Place 1 spray into both nostrils 2 (two) times daily.   Yes [provider]  furosemide (LASIX) 40 MG tablet TAKE 3 TABLETS BY MOUTH TWICE DAILY 06/28/20  Yes Hilty, Nadean Corwin, MD  Insulin Glargine (BASAGLAR KWIKPEN) 100 UNIT/ML Inject 20 Units into the skin 2 (two) times daily. Patient taking differently: Inject 30 Units into the skin 2 (two) times daily. 03/27/20  Yes Nita Sells, MD  insulin lispro (HUMALOG) 100 UNIT/ML KiwkPen Inject 0.02 mLs (2 Units total) into the skin 3 (three) times daily before meals. 03/27/20  Yes Nita Sells, MD  ipratropium (ATROVENT) 0.02 % nebulizer solution Take 2.5 mLs (0.5 mg total) by nebulization every 6 (six) hours as needed for wheezing or shortness of breath. 02/17/20  Yes Chesley Mires, MD  metolazone (ZAROXOLYN) 2.5 MG tablet Take 1.25 mg by mouth 3 (three) times a week. 09/28/20  Yes [provider]  metoprolol succinate (TOPROL XL) 25 MG 24 hr tablet Take 1.5 tablets (37.5 mg total) by mouth daily. 06/07/20  Yes BranchAlphonse Guild, MD  mirtazapine (REMERON) 15 MG tablet Take 1 tablet (15 mg total) by mouth at bedtime. 10/24/17  Yes Hisada, Elie Goody, MD  modafinil (PROVIGIL) 200 MG tablet Take 1 tablet (200 mg total) by mouth daily.  08/28/20  Yes Chesley Mires, MD  montelukast (SINGULAIR) 10 MG tablet Take 1 tablet (10 mg total) by mouth at bedtime. 02/17/20  Yes Chesley Mires, MD  oxyCODONE-acetaminophen (PERCOCET/ROXICET) 5-325 MG tablet Take 1 tablet by mouth 2 (two) times daily as needed for severe pain. 11/27/17  Yes Rogue Bussing, MD  pantoprazole (PROTONIX) 20 MG tablet TAKE (1) TABLET BY MOUTH TWICE A DAY BEFORE MEALS. (BREAKFAST AND SUPPER) Patient taking differently: Take 20 mg by mouth 2 (two) times daily before a meal. 12/02/17  Yes Riccio, Angela C, DO  potassium chloride SA (KLOR-CON) 20 MEQ tablet Take 20 mEq by mouth 2 (two) times daily. 10/27/20  Yes [provider]  primidone (MYSOLINE) 50 MG tablet TAKE ONE TABLET BY MOUTH AT BEDTIME. Patient taking differently: Take 50 mg by mouth at bedtime. 11/10/17  Yes Rogue Bussing, MD  Probiotic Product (PROBIOTIC PO) Take 1 tablet by mouth at bedtime.   Yes [provider]  revefenacin (YUPELRI) 175 MCG/3ML nebulizer solution Take 175 mcg by nebulization daily.   Yes [provider]  TRUE METRIX BLOOD GLUCOSE TEST test strip USE TO TEST BLOOD SUGAR AS DIRECTED. 09/24/17  Yes Nida, Marella Chimes, MD  Vitamin A 2400 MCG (8000 UT) CAPS Take 8,000 Units by mouth every morning.  Yes [provider]  Vitamin D, Ergocalciferol, (DRISDOL) 1.25 MG (50000 UNIT) CAPS capsule Take 1 capsule (50,000 Units total) by mouth every 7 (seven) days. 03/22/20  Yes Barton Dubois, MD  potassium chloride 20 MEQ TBCR Take 20 mEq by mouth 2 (two) times daily. 05/02/19 07/02/19  Barton Dubois, MD    Inpatient medications:  apixaban  5 mg Oral BID   atorvastatin  20 mg Oral Daily   budesonide  0.5 mg Nebulization BID   insulin aspart  0-15 Units Subcutaneous TID WC   insulin aspart  0-5 Units Subcutaneous QHS   insulin glargine  8 Units Subcutaneous QHS   ipratropium-albuterol  3 mL Nebulization Q6H   Living Better with Heart Failure  Book   Does not apply Once   methylPREDNISolone (SOLU-MEDROL) injection  60 mg Intravenous BID   metolazone  2.5 mg Oral Daily   metoprolol succinate  37.5 mg Oral Daily   montelukast  10 mg Oral QHS   pantoprazole  40 mg Oral Daily   primidone  50 mg Oral QHS    Discontinued Meds:   Medications Discontinued During This Encounter  Medication Reason   albuterol (VENTOLIN HFA) 108 (90 Base) MCG/ACT inhaler 6 puff    albuterol (PROVENTIL) (2.5 MG/3ML) 0.083% nebulizer solution 2.5 mg    Vitamin A 2400 MCG (8000 UT) CAPS Error   furosemide (LASIX) injection 80 mg     Social History:  reports that she quit smoking about 31 years ago. Her smoking use included cigarettes. She has a 28.00 pack-year smoking history. She has never used smokeless tobacco. She reports that she does not drink alcohol and does not use drugs.  Family History:   Family History  Problem Relation Age of Onset   Heart disease Mother    COPD Mother    Diabetes Mother    Breast cancer Mother    Heart disease Father    Hyperlipidemia Father    COPD Sister    Heart disease Sister    Diabetes Sister    Heart disease Maternal Grandmother    Cancer Maternal Grandmother        stomach   Cancer Maternal Grandfather        lung    Bipolar disorder Brother     Pertinent items are noted in HPI. Weight change:   Intake/Output Summary (Last 24 hours) at 11/14/2020 1234 Last data filed at 11/14/2020 0700 Gross per 24 hour  Intake 480 ml  Output --  Net 480 ml   BP (!) 96/48   Pulse 90   Temp (!) 97.5 F (36.4 C)   Resp 18   Ht 4\' 11"  (1.499 m)   Wt 103.3 kg   SpO2 94%   BMI 46.00 kg/m  Vitals:   11/14/20 0420 11/14/20 0553 11/14/20 0610 11/14/20 0825  BP:  (!) 72/29 (!) 96/48   Pulse:  90    Resp:  18    Temp:  (!) 97.5 F (36.4 C)    TempSrc:      SpO2:  95%  94%  Weight:  103.3 kg    Height: 4\' 11"  (1.499 m)        General appearance: fatigued and wearing cpap device and somnolent. Head:  Normocephalic, without obvious abnormality, atraumatic Resp: clear to auscultation bilaterally Cardio: irregularly irregular rhythm and no rub GI: soft, non-tender; bowel sounds normal; no masses,  no organomegaly Extremities: edema 3+ pitting edema to mid thighs  Labs: Basic Metabolic Panel:  Recent Labs  Lab 11/13/20 1952 11/14/20 0538  NA 136 139  K 4.8 5.1  CL 96* 97*  CO2 31 31  GLUCOSE 129* 156*  BUN 65* 68*  CREATININE 2.32* 2.40*  ALBUMIN 3.5 3.3*  CALCIUM 8.0* 8.6*   Liver Function Tests: Recent Labs  Lab 11/13/20 1952 11/14/20 0538  AST 30 27  ALT 13 14  ALKPHOS 171* 172*  BILITOT 0.6 1.0  PROT 7.2 6.7  ALBUMIN 3.5 3.3*   No results for input(s): LIPASE, AMYLASE in the last 168 hours. No results for input(s): AMMONIA in the last 168 hours. CBC: Recent Labs  Lab 11/13/20 1952 11/14/20 0538  WBC 9.5 6.9  NEUTROABS 7.1  --   HGB 12.7 12.4  HCT 42.4 41.8  MCV 99.8 100.0  PLT 186 171   PT/INR: @LABRCNTIP (inr:5) Cardiac Enzymes: )No results for input(s): CKTOTAL, CKMB, CKMBINDEX, TROPONINI in the last 168 hours. CBG: Recent Labs  Lab 11/14/20 0228 11/14/20 0730 11/14/20 1115  GLUCAP 154* 121* 207*    Iron Studies: No results for input(s): IRON, TIBC, TRANSFERRIN, FERRITIN in the last 168 hours.  Xrays/Other Studies: DG Chest Portable 1 View  Result Date: 11/13/2020 CLINICAL DATA:  70 year old female with shortness of breath. EXAM: PORTABLE CHEST 1 VIEW COMPARISON:  Chest radiograph dated 05/19/2020. FINDINGS: Small right pleural effusion and right lung base atelectasis or infiltrate. Trace left pleural effusion may be present. There is no pneumothorax. There is mild cardiomegaly. No acute osseous pathology. Degenerative changes of the spine. Cervical ACDF. IMPRESSION: Small right pleural effusion and right lung base atelectasis or infiltrate. Electronically Signed   By: Anner Crete M.D.   On: 11/13/2020 19:27     Assessment/Plan:  AKI/CKD  stage IIIa - in setting of decompensated diastolic CHF and escalating diuretics consistent with cardiorenal syndrome.  Will follow I's/O's and daily BUN/Cr.  Not much above her baseline of around 2.   Acute on chronic diastolic CHF - will change IV lasix to 120 mg q6 and give metolazone 5mg  prior to next dose of furosemide and follow I's/O's and daily weights.  ECHO pending.  Will also check urine prot/creat. COPD exacerbation - Bipap overnight, nebs and steroids per primary svc. Atrial fibrillation - on eliquis and metoprolol. Hyperkalemia - should improve with high dose of IV lasix and metolazone. DM type 2 - per primary   [place] 11/14/2020, 12:34 PM

## 2020-11-14 NOTE — Progress Notes (Signed)
Caron Vary  is a 70 y.o. female, with history of CHF, COPD, diabetes mellitus type 2, GERD, CKD, and more presents the ED with a chief complaint of dyspnea.  She reports that her dyspnea started 2 or 3 weeks ago and has been progressively worse since then.  Patient was admitted with acute diastolic CHF exacerbation as well as COPD exacerbation along with AKI on CKD stage IIIa.  She was noted to have difficulty with diuresis and requires high dose of diuretics for which nephrology consultation was obtained to assist with volume overload.  Acute on chronic diastolic CHF exacerbation -Likely cardiorenal syndrome -Increased IV Lasix to 120 mg every 6 hours along with administration of metolazone -Monitor strict I's and O's -2D echocardiogram ordered and pending  AKI on CKD stage IIIa -In the setting of decompensated diastolic CHF consistent with cardiorenal syndrome -Appreciate nephrology assistance  Acute COPD exacerbation -BiPAP overnight -Nebulizers and steroids  Atrial fibrillation -Metoprolol and Eliquis  Hyperkalemia -Continue to monitor repeat labs, should improve with diuresis  Type 2 diabetes -Continue SSI  GERD -Continue PPI  Total care time: 35 minutes.

## 2020-11-14 NOTE — Progress Notes (Addendum)
Date and time results received: 11/14/20 0203 (use smartphrase ".now" to insert current time)  Test: vbg Critical Value: co2 75.9  Name of Provider Notified: [email or web address]  Orders Received? Or Actions Taken?:  none

## 2020-11-14 NOTE — Progress Notes (Signed)
RT gave patient a breathing tx. Patient requested after to go on home cpap machine. RT assisted her and ran 3L of O2 through the machine. Pt tolerating well at this time.

## 2020-11-14 NOTE — Progress Notes (Signed)
Patient brought her home ventilator with her to hospital.  Set up machine for patient and ran 3L of O2 through machine.  Patient is currently on her machine.  RN setting up continuous pulse ox for patient to be monitored.  Will continue to monitor patient.

## 2020-11-14 NOTE — TOC Initial Note (Signed)
Transition of Care [facility]) - Initial/Assessment Note    Patient Details  Name: Denise Freeman MRN: [id number] Date of Birth: May 12, 1951  [facility]) CM/SW Contact:    Boneta Lucks, RN Phone Number: 11/14/2020, 11:33 AM  Clinical Narrative:      Patient admitted in OBS decompensated heart failure. TOC spoke with her husband. He provides her transportation and helps her complete all ADL's. 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 [place] uses home oxygen. TOC consulted for CHF. THis is not a new diagnosis for her. RN will provide the Living Better Book for patient and husband to ask  Questions. PT is recommending HHPT, Husband is agreeable, requesting Advanced. Vaughan Basta accepted the referral.        Expected Discharge Plan: [place] Barriers to Discharge: Continued Medical Work up   Patient Goals and CMS Choice Patient states their goals for this hospitalization and ongoing recovery are:: to go home. CMS Medicare.gov Compare Post Acute Care list provided to:: Patient Represenative (must comment) Choice offered to / list presented to : Spouse  Expected Discharge Plan and Services Expected Discharge Plan: [place]       Living arrangements for the past 2 months: [place]: PT [place] Agency: [place] ([place]) Date [place]: 11/14/20 Time [place]: 1133 Representative spoke with at [place]: [place] Arrangements/Services Living arrangements for the past 2 months: [place] with:: Spouse Patient language and need for interpreter reviewed:: Yes Do you feel safe going back to the place where you live?: Yes      Need for Family Participation in Patient Care: Yes (Comment) Care giver support system in place?: Yes (comment)   Criminal Activity/Legal Involvement Pertinent to Current Situation/Hospitalization: No  - Comment as needed  Activities of Daily Living Home Assistive Devices/Equipment: CPAP, Oxygen, Hospital bed ADL Screening (condition at time of admission) [name] cognitive ability adequate to safely complete daily activities?: Yes Is the patient deaf or have difficulty hearing?: No Does the patient have difficulty seeing, even when wearing glasses/contacts?: Yes Does the patient have difficulty concentrating, remembering, or making decisions?: No Patient able to express need for assistance with ADLs?: Yes Does the patient have difficulty dressing or bathing?: Yes Independently performs ADLs?: No Communication: Independent Dressing (OT): Independent Grooming: Needs assistance Is this a change from baseline?: Pre-admission baseline Feeding: Independent Bathing: Needs assistance Is this a change from baseline?: Pre-admission baseline Toileting: Needs assistance Is this a change from baseline?: Pre-admission baseline In/Out Bed: Needs assistance Is this a change from baseline?: Pre-admission baseline Walks in Home: Needs assistance Is this a change from baseline?: Pre-admission baseline Does the patient have difficulty walking or climbing stairs?: Yes Weakness of Legs: Both Weakness of Arms/Hands: Both  Permission Sought/Granted            Permission granted to share info w Relationship: Husband     Emotional Assessment         Alcohol / Substance Use: Not Applicable Psych Involvement: No (comment)  Admission diagnosis:  Wheezing [R06.2] Venous insufficiency [I87.2] CHF exacerbation (HCC) [I50.9] Acute on chronic combined systolic and diastolic congestive heart failure ([place]) [I50.43] Patient Active Problem List   Diagnosis Date Noted   Acute decompensated heart failure (  [place]) 03/23/2020   Hyperosmolar hyperglycemic state (HHS) ([place])    Hypokalemia    Diabetic hyperosmolar non-ketotic state ([place])    Class 1 obesity due to excess calories with serious comorbidity  and body mass index (BMI) of 34.0 to 34.9 in adult    Hyperglycemia 03/20/2020   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/2020   Vitreomacular adhesion of right eye 03/15/2020   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   Vision loss, central 03/15/2020   Pseudophakia, both eyes 03/15/2020   Gastroesophageal reflux disease    Prolonged QT interval 04/27/2019   CHF exacerbation ([place]) 01/24/2019   Acute exacerbation of CHF (congestive heart failure) ([place]) 01/23/2019   Gout 11/16/2017   Chronic kidney disease 11/16/2017   Uncontrolled type 2 diabetes mellitus with hyperglycemia ([place]) 11/12/2017   Pain of left lower extremity 10/24/2017   Medically complex patient 10/03/2017   Chronic pain syndrome 10/03/2017   Unstable gait 10/03/2017   Diarrhea 07/29/2017   Type 2 diabetes mellitus with stage 3 chronic kidney disease, with long-term current use of insulin ([place]) 04/01/2017   Essential hypertension, benign 04/01/2017   Mood disorder in conditions classified elsewhere 03/31/2017   Chronic diastolic congestive heart failure ([place]) 58/02/9832   Chronic systolic CHF (congestive heart failure) ([place]) 11/05/2016   Pressure injury of skin 09/09/2016   NICM (nonischemic cardiomyopathy) ([place])    Obesity, Class III, BMI 40-49.9 (morbid obesity) ([place])    Acute systolic congestive heart failure ([place])    S/P laparoscopic cholecystectomy    Heart failure ([place]) 09/06/2016   Dyslipidemia 08/07/2016   Vision loss 04/19/2016   Hx of colonic polyps 03/21/2016   Blood in stool    Benign neoplasm of cecum    Benign neoplasm of rectum    CKD (chronic kidney disease), stage III ([place]) 03/14/2016   Mixed hyperlipidemia 03/14/2016   Scalp lesion 03/14/2016   Peripheral polyneuropathy 01/26/2016   Vitamin D deficiency 09/26/2015   DM type 2 causing vascular disease ([place]) 08/28/2015   Chronic upper GI bleeding 06/24/2015   Acute renal failure ([place]) 06/24/2015   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 [place] [address] Phone: [phone] Fax: [phone]     Social Determinants of Health (SDOH) Interventions    Readmission Risk Interventions Readmission Risk Prevention Plan 01/25/2019  Transportation Screening Complete  PCP or Specialist Appt within 3-5 Days Complete  HRI or [place] Complete  Social Work Consult for [place] Planning/Counseling Complete  Palliative Care Screening Not Applicable  Medication Review Press photographer) Complete  Some recent data might be hidden

## 2020-11-14 NOTE — Plan of Care (Signed)
  Problem: Acute Rehab PT Goals(only PT should resolve) Goal: Pt Will Go Supine/Side To Sit Outcome: Progressing Flowsheets (Taken 11/14/2020 1538) Pt will go Supine/Side to Sit: with min guard assist Goal: Pt Will Go Sit To Supine/Side Outcome: Progressing Flowsheets (Taken 11/14/2020 1538) Pt will go Sit to Supine/Side: with min guard assist Goal: Patient Will Transfer Sit To/From Stand Outcome: Progressing Flowsheets (Taken 11/14/2020 1538) Patient will transfer sit to/from stand: with modified independence Goal: Pt Will Ambulate Outcome: Progressing Flowsheets (Taken 11/14/2020 1538) Pt will Ambulate:  100 feet  with supervision  with rolling walker   Tori Caulin Begley PT, DPT 11/14/20, 3:39 PM

## 2020-11-14 NOTE — H&P (Signed)
TRH H&P    Patient Demographics:    Denise Freeman, is a 70 y.o. female  MRN: [id number]  DOB - April 24, 1951  Admit Date - 11/13/2020  Referring MD/NP/PA: Nyoka Cowden  Outpatient Primary MD for the patient is [name], NP  Patient coming from: Home  Chief complaint- dyspnea   HPI:    Denise Freeman  is a 70 y.o. female, with history of CHF, COPD, diabetes mellitus type 2, GERD, CKD, and more presents the ED with a chief complaint of dyspnea.  She reports that her dyspnea started 2 or 3 weeks ago and has been progressively worse since then.  She denies any chest pain palpitations associated with the dyspnea.  She endorses coughing but it is nonproductive.  She has had leg swelling, and a 10 to 20 pound weight gain over the same interval.  She has weeping in her legs as well that she reports has been present for 4 weeks.  The drainage is clear fluid.  She reports that the area is tender.  She reports dizziness that she associates with hypoglycemia reporting glucose these 80-100.  She reports that her body requires a glucose of 150.  She attributes these more normal glucose to used to decreased appetite.  Her p.o. intake has been reduced.  She reports that she cannot sleep because she is so worried about her glucose.  Patient has a history of CHF and her last echo was in November 2020 that shows an ejection fraction of 50 to 55% with increased ventricular hypertrophy when compared to the 2019 echo.  She had grade 2 diastolic dysfunction.  Patient does not smoke, does not drink, does not use illicit drugs.  She has had 1 COVID-vaccine and reports a negative reaction to it so she will not have any further vaccines.  She is full code.  In the ED Temp 98.1, heart rate 86-100, respiratory rate 14-22, blood pressure 125/63, satting at 97% on 3 L nasal cannula which is her baseline oxygen requirement Patient has no leukocytosis,  chemistry panel reveals an AKI with a creatinine of 2.32 BNP is 1105 Chest x-ray shows a right pleural effusion and right lung base atelectasis Patient was wheezing on exam in the ED to he had a DuoNeb, and she was given Solu-Medrol She was also treated with 80 mg of IV Lasix, patient reports that she is taking 120 mg of p.o. Lasix at home Admission requested for further management of CHF exacerbation    Review of systems:    In addition to the HPI above,  No Fever-chills, No Headache, No changes with Vision or hearing, No problems swallowing food or Liquids, No Chest pain, admits to cough and dyspnea No Abdominal pain, No Nausea or Vomiting, bowel movements are regular, No Blood in stool or Urine, No dysuria, Admits to weeping in her lower extremities greater on the right than the left No new joints pains-aches,  No new weakness, tingling, numbness in any extremity, Admits to 10 pound weight gain No polyuria, polydypsia or polyphagia, No significant Mental Stressors.  All other systems reviewed and are negative.    Past History of the following :    Past Medical History:  Diagnosis Date   Allergy    Anemia    Anxiety    Asthma    Atrial fibrillation ([place])    Bipolar 1 disorder ([place])    Blind    "partially in both eyes" (03/14/2016)   Cholelithiasis    a. 09/2016 s/p Lap Chole.   Chronic bronchitis (HCC)    Chronic combined systolic and diastolic congestive heart failure ([place])    a. 09/2016 Echo: EF 30-35%.   Colon polyps    COPD (chronic obstructive pulmonary disease) (HCC)    Depression    Family history of adverse reaction to anesthesia    Uncle was positive for malignant hyperthermia; patient had testing done and was negative.   Fibromyalgia    GERD (gastroesophageal reflux disease)    Gout    High cholesterol    History of blood transfusion 06/2015   "bleeding from my rectum"   History of hiatal hernia    HOH (hard of hearing)    Hx of colonic polyps  03/21/2016   3 small adenomas no recall - co-morbidities   Neuropathy    Disc Back    NICM (nonischemic cardiomyopathy) ([place])    a. Previously worked up in [place], MD-->low EF with subsequent recovery.  Multiple caths (last ~ 2014 per pt report)--reportedly nl cors;  b. 09/2016 Echo: EF 30-35%, antsept/apical HK, mild MR, mildly dil LA, mod dil RA;  c. 09/2016 Lexi MV: EF 26%, glob HK, sept DK, med size, mod intensity fixed septal defect - BBB/PVC related artifact, no ischemia.   On home oxygen therapy    "3L; 24/7" (03/14/2016)   OSA treated with BiPAP    uses biPAP, 10 (03/14/2016)   Osteoarthritis    Oxygen deficiency    Pneumonia    Type II diabetes mellitus ([place])       Past Surgical History:  Procedure Laterality Date   APPENDECTOMY     "they busted"   bladder stimulator     pt states, "it cannot be turned off; it's in my right hip; dead battery so it's not working anymore". (03/14/2016)   BLADDER SUSPENSION     2003, 2006 and 2010   CATARACT EXTRACTION W/PHACO Right 11/29/2014   Procedure: CATARACT EXTRACTION PHACO AND INTRAOCULAR LENS PLACEMENT (IOC);  Surgeon: Rutherford Guys, MD;  Location: [facility];  Service: Ophthalmology;  Laterality: Right;  CDE:3.81   CATARACT EXTRACTION W/PHACO Left 12/13/2014   Procedure: CATARACT EXTRACTION PHACO AND INTRAOCULAR LENS PLACEMENT (IOC);  Surgeon: Rutherford Guys, MD;  Location: [facility];  Service: Ophthalmology;  Laterality: Left;  CDE:6.59   CERVICAL DISC SURGERY N/A 2009   4, 6, and 7 cervical disc replaced   CHOLECYSTECTOMY N/A 09/13/2016   Procedure: LAPAROSCOPIC CHOLECYSTECTOMY;  Surgeon: Rolm Bookbinder, MD;  Location: [facility];  Service: General;  Laterality: N/A;   COLONOSCOPY WITH PROPOFOL N/A 03/15/2016   Procedure: COLONOSCOPY WITH PROPOFOL;  Surgeon: Gatha Mayer, MD;  Location: [facility];  Service: Endoscopy;  Laterality: N/A;   ESOPHAGOGASTRODUODENOSCOPY (EGD) WITH PROPOFOL N/A 03/15/2016   Procedure: ESOPHAGOGASTRODUODENOSCOPY  (EGD) WITH PROPOFOL;  Surgeon: Gatha Mayer, MD;  Location: [facility];  Service: Endoscopy;  Laterality: N/A;   HEEL SPUR SURGERY Bilateral    HERNIA REPAIR     I & D EXTREMITY Right 06/13/2015   Procedure: MINOR IRRIGATION AND DEBRIDEMENT EXTREMITY REMOVAL OF NAIL;  Surgeon: Daryll Brod, MD;  Location: [facility];  Service: Orthopedics;  Laterality: Right;   TUBAL LIGATION     UMBILICAL HERNIA REPAIR     w/mesh      Social History:      Social History   Tobacco Use   Smoking status: Former    Packs/day: 2.00    Years: 14.00    Pack years: 28.00    Types: Cigarettes    Quit date: 04/12/1989    Years since quitting: 31.6   Smokeless tobacco: Never  Substance Use Topics   Alcohol use: No    Alcohol/week: 0.0 standard drinks       Family History :     Family History  Problem Relation Age of Onset   Heart disease Mother    COPD Mother    Diabetes Mother    Breast cancer Mother    Heart disease Father    Hyperlipidemia Father    COPD Sister    Heart disease Sister    Diabetes Sister    Heart disease Maternal Grandmother    Cancer Maternal Grandmother        stomach   Cancer Maternal Grandfather        lung    Bipolar disorder Brother       Home Medications:   Prior to Admission medications   Medication Sig Start Date End Date Taking? Authorizing Provider  albuterol (PROVENTIL) (2.5 MG/3ML) 0.083% nebulizer solution Take 3 mLs (2.5 mg total) by nebulization every 6 (six) hours as needed for wheezing or shortness of breath. 02/17/20   Chesley Mires, MD  allopurinol (ZYLOPRIM) 100 MG tablet TAKE 1 TABLET BY MOUTH ONCE DAILY. Patient taking differently: Take 100 mg by mouth daily. 12/08/17   Steve Rattler, DO  apixaban (ELIQUIS) 5 MG TABS tablet Take 1 tablet (5 mg total) by mouth 2 (two) times daily. 10/04/20   Arnoldo Lenis, MD  atorvastatin (LIPITOR) 20 MG tablet Take 1 tablet (20 mg total) by mouth daily. Patient not taking: Reported on  09/05/2020 08/21/20 11/19/20  Verta Ellen., NP  budesonide (PULMICORT) 0.5 MG/2ML nebulizer solution Take 2 mLs (0.5 mg total) by nebulization 2 (two) times daily. 02/17/20   Chesley Mires, MD  cetirizine (ZYRTEC) 10 MG tablet Take 10 mg by mouth daily. 04/06/20   [provider]  cholecalciferol (VITAMIN D) 400 units TABS tablet Take 1 tablet (400 Units total) by mouth 2 (two) times daily. 07/17/17   Rogue Bussing, MD  clobetasol cream (TEMOVATE) 1.77 % Apply 1 application topically daily.  01/28/19   [provider]  colchicine 0.6 MG tablet Take 0.6 mg by mouth daily as needed. gout    [provider]  FEROSUL 325 (65 Fe) MG tablet Take 325 mg by mouth daily.  01/08/19   [provider]  fluticasone (FLONASE) 50 MCG/ACT nasal spray Place 1 spray into both nostrils 2 (two) times daily.    [provider]  furosemide (LASIX) 40 MG tablet TAKE 3 TABLETS BY MOUTH TWICE DAILY 06/28/20   Hilty, Nadean Corwin, MD  Insulin Glargine (BASAGLAR KWIKPEN) 100 UNIT/ML Inject 20 Units into the skin 2 (two) times daily. Patient taking differently: No sig reported 03/27/20   Nita Sells, MD  insulin lispro (HUMALOG) 100 UNIT/ML KiwkPen Inject 0.02 mLs (2 Units total) into the skin 3 (three) times daily before meals. 03/27/20   Nita Sells, MD  ipratropium (ATROVENT) 0.02 % nebulizer solution Take 2.5  mLs (0.5 mg total) by nebulization every 6 (six) hours as needed for wheezing or shortness of breath. 02/17/20   Chesley Mires, MD  metolazone (ZAROXOLYN) 2.5 MG tablet Take 1.25 mg by mouth 3 (three) times a week. 09/28/20   [provider]  metoprolol succinate (TOPROL XL) 25 MG 24 hr tablet Take 1.5 tablets (37.5 mg total) by mouth daily. 06/07/20   Arnoldo Lenis, MD  mirtazapine (REMERON) 15 MG tablet Take 1 tablet (15 mg total) by mouth at bedtime. 10/24/17   Norman Clay, MD  modafinil (PROVIGIL) 200 MG tablet Take 1 tablet (200 mg total)  by mouth daily. 08/28/20   Chesley Mires, MD  montelukast (SINGULAIR) 10 MG tablet Take 1 tablet (10 mg total) by mouth at bedtime. 02/17/20   Chesley Mires, MD  oxyCODONE-acetaminophen (PERCOCET/ROXICET) 5-325 MG tablet Take 1 tablet by mouth 2 (two) times daily as needed for severe pain. 11/27/17   Rogue Bussing, MD  pantoprazole (PROTONIX) 20 MG tablet TAKE (1) TABLET BY MOUTH TWICE A DAY BEFORE MEALS. (BREAKFAST AND SUPPER) Patient taking differently: Take 20 mg by mouth 2 (two) times daily before a meal. 12/02/17   Riccio, Gardiner Rhyme, DO  primidone (MYSOLINE) 50 MG tablet TAKE ONE TABLET BY MOUTH AT BEDTIME. Patient taking differently: Take 50 mg by mouth at bedtime. 11/10/17   Rogue Bussing, MD  Probiotic Product (PROBIOTIC PO) Take 1 tablet by mouth at bedtime.    [provider]  revefenacin (YUPELRI) 175 MCG/3ML nebulizer solution Take 175 mcg by nebulization daily.    [provider]  TRUE METRIX BLOOD GLUCOSE TEST test strip USE TO TEST BLOOD SUGAR AS DIRECTED. 09/24/17   Cassandria Anger, MD  Vitamin A 2400 MCG (8000 UT) CAPS Take 8,000 Units by mouth every morning.     [provider]  Vitamin D, Ergocalciferol, (DRISDOL) 1.25 MG (50000 UNIT) CAPS capsule Take 1 capsule (50,000 Units total) by mouth every 7 (seven) days. 03/22/20   Barton Dubois, MD  potassium chloride 20 MEQ TBCR Take 20 mEq by mouth 2 (two) times daily. 05/02/19 07/02/19  Barton Dubois, MD     Allergies:     Allergies  Allergen Reactions   Oseltamivir Hives    Other reaction(s): Other (see comments) "water blisters"   Xarelto [Rivaroxaban] Other (See Comments)    Internal bleeding   Ancef [Cefazolin] Nausea And Vomiting   Levaquin [Levofloxacin In D5w] Other (See Comments)    "afib"   Lyrica [Pregabalin] Hives   Tamiflu [Oseltamivir Phosphate] Other (See Comments)    "water blisters"   Zoloft [Sertraline Hcl] Other (See Comments)    Jaw problems, jittery    Torsemide Other (See Comments)    Not effective   Augmentin [Amoxicillin-Pot Clavulanate] Itching   Ciprofloxacin Itching and Nausea And Vomiting   Haldol [Haloperidol] Other (See Comments)    Restless leg   Nsaids Diarrhea   Penicillins Itching, Nausea And Vomiting and Rash    Has patient had a PCN reaction causing immediate rash, facial/tongue/throat swelling, SOB or lightheadedness with hypotension: Yes Has patient had a PCN reaction causing severe rash involving mucus membranes or skin necrosis: No Has patient had a PCN reaction that required hospitalization No Has patient had a PCN reaction occurring within the last 10 years: Yes If all of the above answers are "NO", then may proceed with Cephalosporin use.   Topamax [Topiramate] Nausea Only     Physical Exam:   Vitals  Blood pressure Marland Kitchen)  133/93, pulse 96, temperature 98.1 F (36.7 C), temperature source Oral, resp. rate 17, SpO2 93 %.  1.  General: Patient lying supine in bed, head of bed elevated, no acute distress   2. Psychiatric: Alert and oriented x 3, mood and behavior normal for situation, pleasant and cooperative with exam   3. Neurologic: Speech and language are normal, face is symmetric, moves all 4 extremities voluntarily, at baseline without acute deficits on limited exam   4. HEENMT:  Head is atraumatic, normocephalic, pupils reactive to light, neck is supple, trachea is midline, mucous membranes are moist   5. Respiratory : Lungs are clear to auscultation bilaterally without wheezing, rhonchi, rales, no cyanosis, no increase in work of breathing or accessory muscle use   6. Cardiovascular : Heart rate normal, rhythm is irregular, no murmurs, rubs or gallops, peripheral pitting edema past knee, weeping in the posterior aspect of the right lower extremity, peripheral pulses palpated   7. Gastrointestinal:  Abdomen is soft, nondistended, nontender to palpation bowel sounds active, no masses or organomegaly  palpated   8. Skin:  Skin is warm, weeping as noted above, excoriations and scabbing over both lower extremities worse on the right than the left, no signs of cellulitis   9.Musculoskeletal:  No acute deformities or trauma, no asymmetry in tone,  peripheral pulses palpated, posterior right lower extremity is tender to palpation where the weeping is present     Data Review:    CBC Recent Labs  Lab 11/13/20 1952  WBC 9.5  HGB 12.7  HCT 42.4  PLT 186  MCV 99.8  MCH 29.9  MCHC 30.0  RDW 15.2  LYMPHSABS 1.3  MONOABS 0.7  EOSABS 0.4  BASOSABS 0.0   ------------------------------------------------------------------------------------------------------------------  Results for orders placed or performed during the hospital encounter of 11/13/20 (from the past 48 hour(s))  Brain natriuretic peptide     Status: Abnormal   Collection Time: 11/13/20  7:52 PM  Result Value Ref Range   B Natriuretic Peptide 1,105.0 (H) 0.0 - 100.0 pg/mL    Comment: Performed at [facility], [address], [place], [place] [address]  Comprehensive metabolic panel     Status: Abnormal   Collection Time: 11/13/20  7:52 PM  Result Value Ref Range   Sodium 136 135 - 145 mmol/L   Potassium 4.8 3.5 - 5.1 mmol/L   Chloride 96 (L) 98 - 111 mmol/L   CO2 31 22 - 32 mmol/L   Glucose, Bld 129 (H) 70 - 99 mg/dL    Comment: Glucose reference range applies only to samples taken after fasting for at least 8 hours.   BUN 65 (H) 8 - 23 mg/dL   Creatinine, Ser 2.32 (H) 0.44 - 1.00 mg/dL   Calcium 8.0 (L) 8.9 - 10.3 mg/dL   Total Protein 7.2 6.5 - 8.1 g/dL   Albumin 3.5 3.5 - 5.0 g/dL   AST 30 15 - 41 U/L   ALT 13 0 - 44 U/L   Alkaline Phosphatase 171 (H) 38 - 126 U/L   Total Bilirubin 0.6 0.3 - 1.2 mg/dL   GFR, Estimated 22 (L) >60 mL/min    Comment: (NOTE) Calculated using the CKD-EPI Creatinine Equation (2021)    Anion gap 9 5 - 15    Comment: Performed at [facility], [address] [place], [place]  [address]  CBC with Differential     Status: None   Collection Time: 11/13/20  7:52 PM  Result Value Ref Range  WBC 9.5 4.0 - 10.5 K/uL   RBC 4.25 3.87 - 5.11 MIL/uL   Hemoglobin 12.7 12.0 - 15.0 g/dL   HCT 42.4 36.0 - 46.0 %   MCV 99.8 80.0 - 100.0 fL   MCH 29.9 26.0 - 34.0 pg   MCHC 30.0 30.0 - 36.0 g/dL   RDW 15.2 11.5 - 15.5 %   Platelets 186 150 - 400 K/uL   nRBC 0.0 0.0 - 0.2 %   Neutrophils Relative % 75 %   Neutro Abs 7.1 1.7 - 7.7 K/uL   Lymphocytes Relative 14 %   Lymphs Abs 1.3 0.7 - 4.0 K/uL   Monocytes Relative 7 %   Monocytes Absolute 0.7 0.1 - 1.0 K/uL   Eosinophils Relative 4 %   Eosinophils Absolute 0.4 0.0 - 0.5 K/uL   Basophils Relative 0 %   Basophils Absolute 0.0 0.0 - 0.1 K/uL   Immature Granulocytes 0 %   Abs Immature Granulocytes 0.03 0.00 - 0.07 K/uL    Comment: Performed at [facility], [address] [place], [place] [address]  Troponin I (High Sensitivity)     Status: None   Collection Time: 11/13/20  7:52 PM  Result Value Ref Range   Troponin I (High Sensitivity) 15 <18 ng/L    Comment: (NOTE) Elevated high sensitivity troponin I (hsTnI) values and significant  changes across serial measurements may suggest ACS but many other  chronic and acute conditions are known to elevate hsTnI results.  Refer to the "Links" section for chest pain algorithms and additional  guidance. Performed at [facility], [address], [place], [place] [address]   Resp Panel by RT-PCR (Flu A&B, Covid) Nasopharyngeal Swab     Status: None   Collection Time: 11/13/20  9:53 PM   Specimen: Nasopharyngeal Swab; Nasopharyngeal(NP) swabs in vial transport medium  Result Value Ref Range   SARS Coronavirus 2 by RT PCR NEGATIVE NEGATIVE    Comment: (NOTE) SARS-CoV-2 target nucleic acids are NOT DETECTED.  The SARS-CoV-2 RNA is generally detectable in upper respiratory specimens during the acute phase of infection. The lowest concentration of SARS-CoV-2 viral copies this  assay can detect is 138 copies/mL. A negative result does not preclude SARS-Cov-2 infection and should not be used as the sole basis for treatment or other patient management decisions. A negative result may occur with  improper specimen collection/handling, submission of specimen other than nasopharyngeal swab, presence of viral mutation(s) within the areas targeted by this assay, and inadequate number of viral copies(<138 copies/mL). A negative result must be combined with clinical observations, patient history, and epidemiological information. The expected result is Negative.  Fact Sheet for Patients:  EntrepreneurPulse.com.au  Fact Sheet for Healthcare Providers:  IncredibleEmployment.be  This test is no t yet approved or cleared by the [place] FDA and  has been authorized for detection and/or diagnosis of SARS-CoV-2 by FDA under an Emergency Use Authorization (EUA). This EUA will remain  in effect (meaning this test can be used) for the duration of the COVID-19 declaration under Section 564(b)(1) of the Act, 21 U.S.C.section 360bbb-3(b)(1), unless the authorization is terminated  or revoked sooner.       Influenza A by PCR NEGATIVE NEGATIVE   Influenza B by PCR NEGATIVE NEGATIVE    Comment: (NOTE) The Xpert Xpress SARS-CoV-2/FLU/RSV plus assay is intended as an aid in the diagnosis of influenza from Nasopharyngeal swab specimens and should not be used as a sole basis for treatment. Nasal washings and aspirates are  unacceptable for Xpert Xpress SARS-CoV-2/FLU/RSV testing.  Fact Sheet for Patients: EntrepreneurPulse.com.au  Fact Sheet for Healthcare Providers: IncredibleEmployment.be  This test is not yet approved or cleared by the [place] FDA and has been authorized for detection and/or diagnosis of SARS-CoV-2 by FDA under an Emergency Use Authorization (EUA). This EUA will remain in  effect (meaning this test can be used) for the duration of the COVID-19 declaration under Section 564(b)(1) of the Act, 21 U.S.C. section 360bbb-3(b)(1), unless the authorization is terminated or revoked.  Performed at [facility], [address], [place], [place] [address]   Blood gas, venous     Status: Abnormal   Collection Time: 11/14/20  1:15 AM  Result Value Ref Range   FIO2 32.00    pH, Ven 7.248 (L) 7.250 - 7.430   pCO2, Ven 75.9 (HH) 44.0 - 60.0 mmHg    Comment: CRITICAL RESULT CALLED TO, READ BACK BY AND VERIFIED WITH: H EVANS,RN@[id number]  11/14/20 MKELLY    pO2, Ven 44.4 32.0 - 45.0 mmHg   Bicarbonate 26.6 20.0 - 28.0 mmol/L   Acid-Base Excess 5.2 (H) 0.0 - 2.0 mmol/L   O2 Saturation 73.4 %   Patient temperature 36.7    Collection site VENOUS     Comment: Performed at [facility], [address], [place], [place] [address]   *Note: Due to a large number of results and/or encounters for the requested time period, some results have not been displayed. A complete set of results can be found in Results Review.    Chemistries  Recent Labs  Lab 11/13/20 1952  NA 136  K 4.8  CL 96*  CO2 31  GLUCOSE 129*  BUN 65*  CREATININE 2.32*  CALCIUM 8.0*  AST 30  ALT 13  ALKPHOS 171*  BILITOT 0.6   ------------------------------------------------------------------------------------------------------------------  ------------------------------------------------------------------------------------------------------------------ GFR: CrCl cannot be calculated (Unknown ideal weight.). Liver Function Tests: Recent Labs  Lab 11/13/20 1952  AST 30  ALT 13  ALKPHOS 171*  BILITOT 0.6  PROT 7.2  ALBUMIN 3.5   No results for input(s): LIPASE, AMYLASE in the last 168 hours. No results for input(s): AMMONIA in the last 168 hours. Coagulation Profile: No results for input(s): INR, PROTIME in the last 168 hours. Cardiac Enzymes: No results for input(s): CKTOTAL, CKMB, CKMBINDEX,  TROPONINI in the last 168 hours. BNP (last 3 results) No results for input(s): PROBNP in the last 8760 hours. HbA1C: No results for input(s): HGBA1C in the last 72 hours. CBG: No results for input(s): GLUCAP in the last 168 hours. Lipid Profile: No results for input(s): CHOL, HDL, LDLCALC, TRIG, CHOLHDL, LDLDIRECT in the last 72 hours. Thyroid Function Tests: No results for input(s): TSH, T4TOTAL, FREET4, T3FREE, THYROIDAB in the last 72 hours. Anemia Panel: No results for input(s): VITAMINB12, FOLATE, FERRITIN, TIBC, IRON, RETICCTPCT in the last 72 hours.  --------------------------------------------------------------------------------------------------------------- Urine analysis:    Component Value Date/Time   COLORURINE STRAW (A) 03/20/2020 1927   APPEARANCEUR CLEAR 03/20/2020 1927   LABSPEC 1.010 03/20/2020 1927   PHURINE 5.0 03/20/2020 1927   GLUCOSEU >=500 (A) 03/20/2020 1927   HGBUR SMALL (A) 03/20/2020 1927   BILIRUBINUR NEGATIVE 03/20/2020 1927   KETONESUR NEGATIVE 03/20/2020 1927   PROTEINUR NEGATIVE 03/20/2020 1927   UROBILINOGEN 1.0 02/20/2015 1855   NITRITE NEGATIVE 03/20/2020 1927   LEUKOCYTESUR NEGATIVE 03/20/2020 1927      Imaging Results:    DG Chest Portable 1 View  Result Date: 11/13/2020 CLINICAL DATA:  70 year old female with shortness of breath.  EXAM: PORTABLE CHEST 1 VIEW COMPARISON:  Chest radiograph dated 05/19/2020. FINDINGS: Small right pleural effusion and right lung base atelectasis or infiltrate. Trace left pleural effusion may be present. There is no pneumothorax. There is mild cardiomegaly. No acute osseous pathology. Degenerative changes of the spine. Cervical ACDF. IMPRESSION: Small right pleural effusion and right lung base atelectasis or infiltrate. Electronically Signed   By: Anner Crete M.D.   On: 11/13/2020 19:27    My personal review of EKG: Rhythm A. fib, Rate 93 /min, QTc 458 ,no Acute ST changes   Assessment & Plan:    Active  Problems:   Chronic hypoxic, hypercapnic respiratory failure   COPD with asthma (HCC)   Acute renal failure (HCC)   DM type 2 causing vascular disease ([place])   Acute decompensated heart failure (HCC)   CHF exacerbation With pleural effusion, mild cardiomegaly, weight gain and peripheral edema, BNP >1000 Last echo was done in November 2020 -reviewed in HPI Continue statin, beta-blocker Continue IV Lasix, strict I's and O's, daily weights Fluid restriction Monitor on telemetry COPD exacerbation Patient on 3 L of nasal cannula at baseline No increase in O2 requirement, but VBG shows a pH of 7.25 with a PCO2 of 75 Will have patient on home BiPAP overnight Continue albuterol and DuoNeb Continue steroids Continue to monitor AKI Superimposed on CKD stage III Creatinine baseline 1.76, today 2.32 Most likely secondary to cardiorenal syndrome Continue diuresis as above Trend in the a.m. Atrial fibrillation Continue Eliquis and beta-blocker Diabetes mellitus type 2 Since patient reports borderline low blood sugars at home, will reduce home insulin from 16 units nightly to 8 units Sliding scale coverage Continue to monitor GERD Continue Protonix    DVT Prophylaxis-   Eliquis- SCDs   AM Labs Ordered, also please review Full Orders  Family Communication: No family at bedside Code Status: Full  Admission status: Observation   The patient's presenting symptoms include dyspnea. The worrisome physical exam findings include weeping lower extremity, peripheral edema. The initial radiographic and laboratory data are worrisome because of AKI, PCO2 75, pH 7.25. The chronic co-morbidities include diabetes mellitus, GERD, CHF, chronic kidney disease stage III.         Time spent in minutes : [place]

## 2020-11-14 NOTE — Evaluation (Signed)
Physical Therapy Evaluation Patient Details Name: Denise Freeman MRN: [id number] DOB: 10/05/1950 Today's Date: 11/14/2020   History of Present Illness  Denise Freeman is a 70 y.o. female with c/o dyspnea x3 weeks, endorses nonproductive coughing, leg swelling and weight gain. Chest x-ray shows a right pleural effusion and right lung base atelectasis. PMH: CHF, COPD, diabetes mellitus type 2, GERD, CKD, asthma, afib, bipolar 1 disorder, partially blind, fibromyalgia, OSA on BiPAP, on 3L chronically, cervical disc surgery 2009  Clinical Impression  Pt admitted with above diagnosis. Pt using rollator around the home with spouse assisting as needed with bathing and dressing, spouse completes household chores and grocery shopping. Pt on 3L O2 chronically. Pt requiring min A with bed mobility for LE assistance, min guard to power to stand and limited ambulation in room with RW. Pt on 3L O2 with SpO2 >90% during session and 3/4 dyspnea noted. Pt able to perform transfer to [facility], void bladder and perform peri care independently. Assisted pt back to supine due to tired from not sleeping; educated pt on time OOB during the day and with meals and pt verbalizes agreement. Pt currently with functional limitations due to the deficits listed below (see PT Problem List). Pt will benefit from skilled PT to increase their independence and safety with mobility to allow discharge to the venue listed below.       Follow Up Recommendations Home health PT;Supervision/Assistance - 24 hour    Equipment Recommendations  None recommended by PT    Recommendations for Other Services       Precautions / Restrictions Precautions Precautions: Fall Precaution Comments: chronic 3L O2 Restrictions Weight Bearing Restrictions: No      Mobility  Bed Mobility Overal bed mobility: Needs Assistance Bed Mobility: Supine to Sit;Sit to Supine  Supine to sit: Min assist;HOB elevated Sit to supine: Min assist   General bed  mobility comments: increased time to come to sitting EOB with HOB elevated and use of bedrail, min A for BLE assistance into/out of bed    Transfers Overall transfer level: Needs assistance Equipment used: Rolling walker (2 wheeled) Transfers: Sit to/from [facility] Sit to Stand: Min guard Stand pivot transfers: Min guard  General transfer comment: min guard to steady with power to stand, good steadiness upon rising, BUE assisting to power up  Ambulation/Gait Ambulation/Gait assistance: Min guard Gait Distance (Feet): 16 Feet Assistive device: Rolling walker (2 wheeled) Gait Pattern/deviations: Step-to pattern;Decreased stride length Gait velocity: decreased   General Gait Details: short, steady, slow step to steps with RW, no LOB, limited by 3/4 dyspnea, therapist managing lines for safety  Stairs            Wheelchair Mobility    Modified Rankin (Stroke Patients Only)       Balance Overall balance assessment: Needs assistance Sitting-balance support: Feet supported Sitting balance-Leahy Scale: Good Sitting balance - Comments: seated EOB   Standing balance support: During functional activity;Bilateral upper extremity supported Standing balance-Leahy Scale: Poor Standing balance comment: reliant on UE support        Pertinent Vitals/Pain Pain Assessment: Faces Faces Pain Scale: Hurts a little bit Pain Location: "all over" Pain Descriptors / Indicators: Other (Comment) ("chronic pain all day and night", "fibromyalgia"\) Pain Intervention(s): Limited activity within patient's tolerance;Monitored during session    [place] expects to be discharged to:: Private residence Living Arrangements: Spouse/significant other Available Help at Discharge: Family;Available 24 hours/day Type of Home: Mobile home Home Access: Ramped entrance  Home Layout: One level Home Equipment: [place] - 4 wheels;Bedside commode;Shower seat;Electric  scooter;Cane - single point      Prior Function Level of Independence: Needs assistance   Gait / Transfers Assistance Needed: Pt reports using rollator for mobility, husband assists as needed with bed mobility and transfers  ADL's / Homemaking Assistance Needed: Pt reports husband assists with bathing and dressing, spouse completes meal prep, housekeeping, drives        Hand Dominance        Extremity/Trunk Assessment   Upper Extremity Assessment Upper Extremity Assessment: Overall WFL for tasks assessed    Lower Extremity Assessment Lower Extremity Assessment: Generalized weakness (AROM WNL, strength grossly 4-/5, "neuropathy" in BLE)    Cervical / Trunk Assessment Cervical / Trunk Assessment: Normal  Communication   Communication: No difficulties  Cognition Arousal/Alertness: Awake/alert Behavior During Therapy: WFL for tasks assessed/performed Overall Cognitive Status: Within Functional Limits for tasks assessed       General Comments General comments (skin integrity, edema, etc.): SpO2 >90 on 3L O2 and HR 80-90s with mobility    Exercises     Assessment/Plan    PT Assessment Patient needs continued PT services  PT Problem List Decreased strength;Decreased activity tolerance;Decreased balance;Decreased mobility;Cardiopulmonary status limiting activity;Obesity;Pain       PT Treatment Interventions DME instruction;Gait training;Functional mobility training;Therapeutic activities;Therapeutic exercise;Balance training;Patient/family education    PT Goals (Current goals can be found in the Care Plan section)  Acute Rehab PT Goals Patient Stated Goal: home with spouse and HHPT PT Goal Formulation: With patient Time For Goal Achievement: 11/28/20 Potential to Achieve Goals: Good    Frequency Min 3X/week   Barriers to discharge        Co-evaluation               AM-PAC PT "6 Clicks" Mobility  Outcome Measure Help needed turning from your back to  your side while in a flat bed without using bedrails?: A Little Help needed moving from lying on your back to sitting on the side of a flat bed without using bedrails?: A Little Help needed moving to and from a bed to a chair (including a wheelchair)?: A Little Help needed standing up from a chair using your arms (e.g., wheelchair or bedside chair)?: A Little Help needed to walk in hospital room?: A Little Help needed climbing 3-5 steps with a railing? : A Little 6 Click Score: 18    End of Session Equipment Utilized During Treatment: Oxygen Activity Tolerance: Patient tolerated treatment well Patient left: in bed;with call bell/phone within reach Nurse Communication: Mobility status;Other (comment) (pt voided bladder in [facility]) PT Visit Diagnosis: Unsteadiness on feet (R26.81);Other abnormalities of gait and mobility (R26.89);Muscle weakness (generalized) (M62.81)    Time: 4166-0630 PT Time Calculation (min) (ACUTE ONLY): 33 min   Charges:   PT Evaluation $PT Eval Low Complexity: 1 Low PT Treatments $Therapeutic Activity: 8-22 mins        Tori Nikko Goldwire PT, DPT 11/14/20, 3:35 PM

## 2020-11-14 NOTE — Progress Notes (Signed)
Nutrition Education Note  RD consulted for nutrition education regarding onset CHF. HX of COPD, (home vent the past 2-3 months), GERD, HOH, Bipolar 1 disorder, Fibromyalgia, Neuropathy.    RD provided "Heart Healthy/CHO modified and Diabetes Plate Method" handouts to patient and husband. Reviewed patient's dietary recall. Provided examples on ways to decrease sodium intake in diet. Patient denies adding salt and says that her husband rinses can vegetables before preparing.   Encouraged fresh fruits and vegetables as well as whole grain sources of carbohydrates to maximize fiber intake.   RD discussed why it is important for patient to adhere to diet recommendations, and emphasized the role of fluids, foods to avoid, and importance of weighing self daily. Teach back method used.  Expect good compliance.  Body mass index is 46 kg/m. Pt meets criteria for morbid obesity based on current BMI.   Intake/Output Summary (Last 24 hours) at 11/14/2020 1308 Last data filed at 11/14/2020 1100 Gross per 24 hour  Intake 960 ml  Output --  Net 960 ml     Current diet order is Heart Healthy /CHO modified, patient is consuming approximately 50-75% of meals at this time. Able to feed herself but has impaired vision. She is eating lunch during RD visit. Husband at bedside.   Labs, meds and weights reviewed.  BMP Latest Ref Rng & Units 11/14/2020 11/13/2020 06/08/2020  Glucose 70 - 99 mg/dL 156(H) 129(H) 99  BUN 8 - 23 mg/dL 68(H) 65(H) 111(H)  Creatinine 0.44 - 1.00 mg/dL 2.40(H) 2.32(H) 1.76(H)  BUN/Creat Ratio 6 - 22 (calc) - - 63(H)  Sodium 135 - 145 mmol/L 139 136 139  Potassium 3.5 - 5.1 mmol/L 5.1 4.8 3.2(L)  Chloride 98 - 111 mmol/L 97(L) 96(L) 89(L)  CO2 22 - 32 mmol/L 31 31 36(H)  Calcium 8.9 - 10.3 mg/dL 8.6(L) 8.0(L) 8.2(L)     No further nutrition interventions warranted at this time. If additional nutrition issues arise, please re-consult RD.   Colman Cater MS,RD,CSG,LDN Contact: Shea Evans

## 2020-11-15 ENCOUNTER — Other Ambulatory Visit: Payer: Self-pay | Admitting: *Deleted

## 2020-11-15 ENCOUNTER — Other Ambulatory Visit (HOSPITAL_COMMUNITY): Payer: Medicare Other

## 2020-11-15 DIAGNOSIS — I509 Heart failure, unspecified: Secondary | ICD-10-CM | POA: Diagnosis not present

## 2020-11-15 LAB — HEMOGLOBIN A1C
Hgb A1c MFr Bld: 7.1 % — ABNORMAL HIGH (ref 4.8–5.6)
Mean Plasma Glucose: 157 mg/dL

## 2020-11-15 LAB — RENAL FUNCTION PANEL
Albumin: 3.3 g/dL — ABNORMAL LOW (ref 3.5–5.0)
Anion gap: 11 (ref 5–15)
BUN: 76 mg/dL — ABNORMAL HIGH (ref 8–23)
CO2: 31 mmol/L (ref 22–32)
Calcium: 8.3 mg/dL — ABNORMAL LOW (ref 8.9–10.3)
Chloride: 95 mmol/L — ABNORMAL LOW (ref 98–111)
Creatinine, Ser: 2.52 mg/dL — ABNORMAL HIGH (ref 0.44–1.00)
GFR, Estimated: 20 mL/min — ABNORMAL LOW (ref >60)
Glucose, Bld: 184 mg/dL — ABNORMAL HIGH (ref 70–99)
Phosphorus: 4.3 mg/dL (ref 2.5–4.6)
Potassium: 4.6 mmol/L (ref 3.5–5.1)
Sodium: 137 mmol/L (ref 135–145)

## 2020-11-15 LAB — GLUCOSE, CAPILLARY
Glucose-Capillary: 114 mg/dL — ABNORMAL HIGH (ref 70–99)
Glucose-Capillary: 170 mg/dL — ABNORMAL HIGH (ref 70–99)
Glucose-Capillary: 171 mg/dL — ABNORMAL HIGH (ref 70–99)
Glucose-Capillary: 190 mg/dL — ABNORMAL HIGH (ref 70–99)
Glucose-Capillary: 219 mg/dL — ABNORMAL HIGH (ref 70–99)

## 2020-11-15 LAB — CBC
HCT: 37.3 % (ref 36.0–46.0)
Hemoglobin: 11.3 g/dL — ABNORMAL LOW (ref 12.0–15.0)
MCH: 29.6 pg (ref 26.0–34.0)
MCHC: 30.3 g/dL (ref 30.0–36.0)
MCV: 97.6 fL (ref 80.0–100.0)
Platelets: 182 10*3/uL (ref 150–400)
RBC: 3.82 MIL/uL — ABNORMAL LOW (ref 3.87–5.11)
RDW: 15.5 % (ref 11.5–15.5)
WBC: 6.2 10*3/uL (ref 4.0–10.5)
nRBC: 0 % (ref 0.0–0.2)

## 2020-11-15 LAB — MAGNESIUM: Magnesium: 1.8 mg/dL (ref 1.7–2.4)

## 2020-11-15 MED ORDER — METHYLPREDNISOLONE SODIUM SUCC 40 MG IJ SOLR
40.0000 mg | Freq: Two times a day (BID) | INTRAMUSCULAR | Status: DC
  Administered 2020-11-15 – 2020-11-18 (×6): 40 mg via INTRAVENOUS
  Filled 2020-11-15 (×6): qty 1

## 2020-11-15 MED ORDER — FUROSEMIDE 10 MG/ML IJ SOLN
160.0000 mg | Freq: Four times a day (QID) | INTRAVENOUS | Status: DC
  Administered 2020-11-15 – 2020-11-16 (×4): 160 mg via INTRAVENOUS
  Filled 2020-11-15 (×9): qty 16

## 2020-11-15 MED ORDER — CHLORHEXIDINE GLUCONATE CLOTH 2 % EX PADS
6.0000 | MEDICATED_PAD | Freq: Every day | CUTANEOUS | Status: DC
  Administered 2020-11-15 – 2020-11-19 (×2): 6 via TOPICAL

## 2020-11-15 MED ORDER — METOLAZONE 5 MG PO TABS
5.0000 mg | ORAL_TABLET | Freq: Every day | ORAL | Status: DC
  Administered 2020-11-16 – 2020-11-19 (×4): 5 mg via ORAL
  Filled 2020-11-15 (×4): qty 1

## 2020-11-15 NOTE — Patient Outreach (Signed)
[facility]) Care Management  11/15/2020  Kamsiyochukwu Buist [facility] Aug 07, 1950 [id number]   Care Coordination    Subjective Incoming call from patient , she explains that she is in the [facility] due to fluid build. She requested that I contact , [facility] service and Cardiology office to cancel her ride and appointment for 6/17  for Echo as she anticipates still being in hospital at that time and will probably have test done during her stay.   Placed call to Dr. Harl Bowie office to  cancel appointment for 6/17 and Call to [facility] transportation to cancel ride arrangements for 6/17.   Plan  Will follow patient discharge disposition plans for continued [facility] care management services. Will alert Hospital liaison of admission.    Joylene Draft, RN, BSN  [place] Management Coordinator  [phone]- Mobile [phone]- Toll Free Main Office

## 2020-11-15 NOTE — Progress Notes (Signed)
PROGRESS NOTE    SUMAIYA ARRUDA  IRW:[id number] DOB: 02-Mar-1951 DOA: 11/13/2020 PCP: [name], NP   Brief Narrative:   Jemimah Cressy  is a 70 y.o. female, with history of CHF, COPD, diabetes mellitus type 2, GERD, CKD, and more presents the ED with a chief complaint of dyspnea.  She reports that her dyspnea started 2 or 3 weeks ago and has been progressively worse since then.  Patient was admitted with acute diastolic CHF exacerbation as well as COPD exacerbation along with AKI on CKD stage IIIa.  She was noted to have difficulty with diuresis and requires high dose of diuretics for which nephrology consultation was obtained to assist with volume overload.  Assessment & Plan:   Active Problems:   Chronic hypoxic, hypercapnic respiratory failure   COPD with asthma (HCC)   Acute renal failure (HCC)   DM type 2 causing vascular disease ([place])   Acute decompensated heart failure (HCC)   Acute on chronic diastolic (congestive) heart failure (HCC)   Acute on chronic diastolic CHF exacerbation -Likely cardiorenal syndrome -Increased IV Lasix to 160 mg every 6 hours along with administration of metolazone ongoing -Monitor strict I's and O's -2D echocardiogram ordered and pending   AKI on CKD stage IIIa -In the setting of decompensated diastolic CHF consistent with cardiorenal syndrome -Appreciate nephrology assistance   Acute COPD exacerbation -BiPAP overnight -Nebulizers and steroids   Atrial fibrillation -Metoprolol and Eliquis   Type 2 diabetes -Continue SSI   GERD -Continue PPI   DVT prophylaxis:Eliquis Code Status: Full Family Communication: None at bedside Disposition Plan:  Status is: Inpatient  Remains inpatient appropriate because:IV treatments appropriate due to intensity of illness or inability to take PO  Dispo: The patient is from: Home              Anticipated d/c is to: Home with home health              Patient currently is not medically stable to  d/c.   Difficult to place patient No   Consultants:  Nephrology  Procedures:  See below  Antimicrobials:  None   Subjective: Patient seen and evaluated with ongoing volume overload, but increased urinary frequency.  She ends up running to the bathroom to urinate.  Objective: Vitals:   11/15/20 0400 11/15/20 0441 11/15/20 0733 11/15/20 0739  BP: (!) 100/55     Pulse: 70     Resp: 19     Temp: 98.1 F (36.7 C)     TempSrc:      SpO2: 97%  95% 95%  Weight:  103.4 kg    Height:        Intake/Output Summary (Last 24 hours) at 11/15/2020 1247 Last data filed at 11/15/2020 0900 Gross per 24 hour  Intake 240 ml  Output 300 ml  Net -60 ml   Filed Weights   11/14/20 0553 11/15/20 0441  Weight: 103.3 kg 103.4 kg    Examination:  General exam: Appears calm and comfortable, obese Respiratory system: Clear to auscultation. Respiratory effort normal.  Currently on nasal cannula. Cardiovascular system: S1 & S2 heard, RRR.  Gastrointestinal system: Abdomen is soft Central nervous system: Alert and awake Extremities: Ongoing 2+ edema to thighs bilaterally Skin: No significant lesions noted Psychiatry: Flat affect.    Data Reviewed: I have personally reviewed following labs and imaging studies  CBC: Recent Labs  Lab 11/13/20 1952 11/14/20 0538 11/15/20 0548  WBC 9.5 6.9 6.2  NEUTROABS 7.1  --   --  HGB 12.7 12.4 11.3*  HCT 42.4 41.8 37.3  MCV 99.8 100.0 97.6  PLT 186 171 762   Basic Metabolic Panel: Recent Labs  Lab 11/13/20 1952 11/14/20 0538 11/15/20 0548  NA 136 139 137  K 4.8 5.1 4.6  CL 96* 97* 95*  CO2 31 31 31   GLUCOSE 129* 156* 184*  BUN 65* 68* 76*  CREATININE 2.32* 2.40* 2.52*  CALCIUM 8.0* 8.6* 8.3*  MG  --   --  1.8  PHOS  --   --  4.3   GFR: Estimated Creatinine Clearance: 22.4 mL/min (A) (by C-G formula based on SCr of 2.52 mg/dL (H)). Liver Function Tests: Recent Labs  Lab 11/13/20 1952 11/14/20 0538 11/15/20 0548  AST 30 27   --   ALT 13 14  --   ALKPHOS 171* 172*  --   BILITOT 0.6 1.0  --   PROT 7.2 6.7  --   ALBUMIN 3.5 3.3* 3.3*   No results for input(s): LIPASE, AMYLASE in the last 168 hours. No results for input(s): AMMONIA in the last 168 hours. Coagulation Profile: No results for input(s): INR, PROTIME in the last 168 hours. Cardiac Enzymes: No results for input(s): CKTOTAL, CKMB, CKMBINDEX, TROPONINI in the last 168 hours. BNP (last 3 results) No results for input(s): PROBNP in the last 8760 hours. HbA1C: Recent Labs    11/14/20 0538  HGBA1C 7.1*   CBG: Recent Labs  Lab 11/14/20 1115 11/14/20 1655 11/14/20 2012 11/15/20 0740 11/15/20 1133  GLUCAP 207* 155* 174* 171* 114*   Lipid Profile: No results for input(s): CHOL, HDL, LDLCALC, TRIG, CHOLHDL, LDLDIRECT in the last 72 hours. Thyroid Function Tests: No results for input(s): TSH, T4TOTAL, FREET4, T3FREE, THYROIDAB in the last 72 hours. Anemia Panel: No results for input(s): VITAMINB12, FOLATE, FERRITIN, TIBC, IRON, RETICCTPCT in the last 72 hours. Sepsis Labs: No results for input(s): PROCALCITON, LATICACIDVEN in the last 168 hours.  Recent Results (from the past 240 hour(s))  Resp Panel by RT-PCR (Flu A&B, Covid) Nasopharyngeal Swab     Status: None   Collection Time: 11/13/20  9:53 PM   Specimen: Nasopharyngeal Swab; Nasopharyngeal(NP) swabs in vial transport medium  Result Value Ref Range Status   SARS Coronavirus 2 by RT PCR NEGATIVE NEGATIVE Final    Comment: (NOTE) SARS-CoV-2 target nucleic acids are NOT DETECTED.  The SARS-CoV-2 RNA is generally detectable in upper respiratory specimens during the acute phase of infection. The lowest concentration of SARS-CoV-2 viral copies this assay can detect is 138 copies/mL. A negative result does not preclude SARS-Cov-2 infection and should not be used as the sole basis for treatment or other patient management decisions. A negative result may occur with  improper specimen  collection/handling, submission of specimen other than nasopharyngeal swab, presence of viral mutation(s) within the areas targeted by this assay, and inadequate number of viral copies(<138 copies/mL). A negative result must be combined with clinical observations, patient history, and epidemiological information. The expected result is Negative.  Fact Sheet for Patients:  EntrepreneurPulse.com.au  Fact Sheet for Healthcare Providers:  IncredibleEmployment.be  This test is no t yet approved or cleared by the [place] FDA and  has been authorized for detection and/or diagnosis of SARS-CoV-2 by FDA under an Emergency Use Authorization (EUA). This EUA will remain  in effect (meaning this test can be used) for the duration of the COVID-19 declaration under Section 564(b)(1) of the Act, 21 U.S.C.section 360bbb-3(b)(1), unless the authorization is terminated  or revoked sooner.  Influenza A by PCR NEGATIVE NEGATIVE Final   Influenza B by PCR NEGATIVE NEGATIVE Final    Comment: (NOTE) The Xpert Xpress SARS-CoV-2/FLU/RSV plus assay is intended as an aid in the diagnosis of influenza from Nasopharyngeal swab specimens and should not be used as a sole basis for treatment. Nasal washings and aspirates are unacceptable for Xpert Xpress SARS-CoV-2/FLU/RSV testing.  Fact Sheet for Patients: EntrepreneurPulse.com.au  Fact Sheet for Healthcare Providers: IncredibleEmployment.be  This test is not yet approved or cleared by the [place] FDA and has been authorized for detection and/or diagnosis of SARS-CoV-2 by FDA under an Emergency Use Authorization (EUA). This EUA will remain in effect (meaning this test can be used) for the duration of the COVID-19 declaration under Section 564(b)(1) of the Act, 21 U.S.C. section 360bbb-3(b)(1), unless the authorization is terminated or revoked.  Performed at [facility], [address], [place], [place] [address]          Radiology Studies: DG Chest Portable 1 View  Result Date: 11/13/2020 CLINICAL DATA:  70 year old female with shortness of breath. EXAM: PORTABLE CHEST 1 VIEW COMPARISON:  Chest radiograph dated 05/19/2020. FINDINGS: Small right pleural effusion and right lung base atelectasis or infiltrate. Trace left pleural effusion may be present. There is no pneumothorax. There is mild cardiomegaly. No acute osseous pathology. Degenerative changes of the spine. Cervical ACDF. IMPRESSION: Small right pleural effusion and right lung base atelectasis or infiltrate. Electronically Signed   By: Anner Crete M.D.   On: 11/13/2020 19:27        Scheduled Meds:  apixaban  5 mg Oral BID   atorvastatin  20 mg Oral Daily   budesonide  0.5 mg Nebulization BID   Chlorhexidine Gluconate Cloth  6 each Topical Daily   insulin aspart  0-15 Units Subcutaneous TID WC   insulin aspart  0-5 Units Subcutaneous QHS   insulin glargine  8 Units Subcutaneous QHS   ipratropium-albuterol  3 mL Nebulization TID   Living Better with Heart Failure Book   Does not apply Once   methylPREDNISolone (SOLU-MEDROL) injection  40 mg Intravenous BID   [START ON 11/16/2020] metolazone  5 mg Oral Daily   metoprolol succinate  37.5 mg Oral Daily   montelukast  10 mg Oral QHS   pantoprazole  40 mg Oral Daily   primidone  50 mg Oral QHS   Continuous Infusions:  furosemide       LOS: 1 day    Time spent: 35 minutes    Eros [place]our Darleen Crocker, DO [facility]  If 7PM-7AM, please contact night-coverage [email or web address] 11/15/2020, 12:47 PM

## 2020-11-15 NOTE — Progress Notes (Signed)
Patient ID: Denise Freeman, female   DOB: [birth year], 70 y.o.   MRN: [id number] S: STill feels "rough" but admits to increased urination although not being documented.  "I keep running to the potty" O:BP (!) 100/55 (BP Location: Right Arm)   Pulse 70   Temp 98.1 F (36.7 C)   Resp 19   Ht 4\' 11"  (1.499 m)   Wt 103.4 kg   SpO2 95%   BMI 46.04 kg/m   Intake/Output Summary (Last 24 hours) at 11/15/2020 1150 Last data filed at 11/15/2020 0900 Gross per 24 hour  Intake 290 ml  Output 300 ml  Net -10 ml   Intake/Output: I/O last 3 completed shifts: In: 1010 [P.O.:960; IV Piggyback:50] Out: 700 [Urine:700]  Intake/Output this shift:  Total I/O In: 240 [P.O.:240] Out: -  Weight change: 0.1 kg HER:DEYCX, wearing bipap CVS:RRR Resp: CTA Abd:+BS, soft, NT Ext:2+ edema to thighs bilaterally  Recent Labs  Lab 11/13/20 1952 11/14/20 0538 11/15/20 0548  NA 136 139 137  K 4.8 5.1 4.6  CL 96* 97* 95*  CO2 31 31 31   GLUCOSE 129* 156* 184*  BUN 65* 68* 76*  CREATININE 2.32* 2.40* 2.52*  ALBUMIN 3.5 3.3* 3.3*  CALCIUM 8.0* 8.6* 8.3*  PHOS  --   --  4.3  AST 30 27  --   ALT 13 14  --    Liver Function Tests: Recent Labs  Lab 11/13/20 1952 11/14/20 0538 11/15/20 0548  AST 30 27  --   ALT 13 14  --   ALKPHOS 171* 172*  --   BILITOT 0.6 1.0  --   PROT 7.2 6.7  --   ALBUMIN 3.5 3.3* 3.3*   No results for input(s): LIPASE, AMYLASE in the last 168 hours. No results for input(s): AMMONIA in the last 168 hours. CBC: Recent Labs  Lab 11/13/20 1952 11/14/20 0538 11/15/20 0548  WBC 9.5 6.9 6.2  NEUTROABS 7.1  --   --   HGB 12.7 12.4 11.3*  HCT 42.4 41.8 37.3  MCV 99.8 100.0 97.6  PLT 186 171 182   Cardiac Enzymes: No results for input(s): CKTOTAL, CKMB, CKMBINDEX, TROPONINI in the last 168 hours. CBG: Recent Labs  Lab 11/14/20 1115 11/14/20 1655 11/14/20 2012 11/15/20 0740 11/15/20 1133  GLUCAP 207* 155* 174* 171* 114*    Iron Studies: No results for  input(s): IRON, TIBC, TRANSFERRIN, FERRITIN in the last 72 hours. Studies/Results: DG Chest Portable 1 View  Result Date: 11/13/2020 CLINICAL DATA:  70 year old female with shortness of breath. EXAM: PORTABLE CHEST 1 VIEW COMPARISON:  Chest radiograph dated 05/19/2020. FINDINGS: Small right pleural effusion and right lung base atelectasis or infiltrate. Trace left pleural effusion may be present. There is no pneumothorax. There is mild cardiomegaly. No acute osseous pathology. Degenerative changes of the spine. Cervical ACDF. IMPRESSION: Small right pleural effusion and right lung base atelectasis or infiltrate. Electronically Signed   By: Anner Crete M.D.   On: 11/13/2020 19:27    apixaban  5 mg Oral BID   atorvastatin  20 mg Oral Daily   budesonide  0.5 mg Nebulization BID   insulin aspart  0-15 Units Subcutaneous TID WC   insulin aspart  0-5 Units Subcutaneous QHS   insulin glargine  8 Units Subcutaneous QHS   ipratropium-albuterol  3 mL Nebulization TID   Living Better with Heart Failure Book   Does not apply Once   methylPREDNISolone (SOLU-MEDROL) injection  60 mg Intravenous BID   metolazone  2.5 mg Oral Daily   metoprolol succinate  37.5 mg Oral Daily   montelukast  10 mg Oral QHS   pantoprazole  40 mg Oral Daily   primidone  50 mg Oral QHS    BMET    Component Value Date/Time   NA 137 11/15/2020 0548   NA 139 02/15/2019 1202   K 4.6 11/15/2020 0548   CL 95 (L) 11/15/2020 0548   CO2 31 11/15/2020 0548   GLUCOSE 184 (H) 11/15/2020 0548   BUN 76 (H) 11/15/2020 0548   BUN 66 (H) 02/15/2019 1202   CREATININE 2.52 (H) 11/15/2020 0548   CREATININE 1.76 (H) 06/08/2020 1148   CALCIUM 8.3 (L) 11/15/2020 0548   GFRNONAA 20 (L) 11/15/2020 0548   GFRNONAA 28 (L) 11/05/2017 0927   GFRAA >60 05/02/2019 0636   GFRAA 32 (L) 11/05/2017 0927   CBC    Component Value Date/Time   WBC 6.2 11/15/2020 0548   RBC 3.82 (L) 11/15/2020 0548   HGB 11.3 (L) 11/15/2020 0548   HGB 10.7 (L)  07/29/2017 1115   HCT 37.3 11/15/2020 0548   HCT 33.2 (L) 07/29/2017 1115   PLT 182 11/15/2020 0548   MCV 97.6 11/15/2020 0548   MCV 85 07/29/2017 1115   MCH 29.6 11/15/2020 0548   MCHC 30.3 11/15/2020 0548   RDW 15.5 11/15/2020 0548   RDW 12.6 07/29/2017 1115   LYMPHSABS 1.3 11/13/2020 1952   LYMPHSABS 2.3 07/29/2017 1115   MONOABS 0.7 11/13/2020 1952   EOSABS 0.4 11/13/2020 1952   EOSABS 0.2 07/29/2017 1115   BASOSABS 0.0 11/13/2020 1952   BASOSABS 0.0 07/29/2017 1115    Assessment/Plan:  AKI/CKD stage IIIa - in setting of decompensated diastolic CHF and escalating diuretics consistent with cardiorenal syndrome.  Will follow I's/O's and daily BUN/Cr.  Not much above her baseline of around 2.   Acute on chronic diastolic CHF - ECHO pending.  Not a significant increase in UOP or weight.  Will increase lasix to 160 mg IV q 6 and continue with metolazone.  May need lasix drip.  Will also order foley catheter given poor documentation of I's/O's.  COPD exacerbation - Bipap overnight, nebs and steroids per primary svc. Atrial fibrillation - on eliquis and metoprolol. Hyperkalemia - should improve with high dose of IV lasix and metolazone. DM type 2 - per primary  Donetta Potts, MD [facility] [phone]

## 2020-11-16 ENCOUNTER — Other Ambulatory Visit (HOSPITAL_COMMUNITY): Payer: Self-pay | Admitting: *Deleted

## 2020-11-16 ENCOUNTER — Inpatient Hospital Stay (HOSPITAL_COMMUNITY): Payer: Medicare Other

## 2020-11-16 DIAGNOSIS — I509 Heart failure, unspecified: Secondary | ICD-10-CM | POA: Diagnosis not present

## 2020-11-16 LAB — CBC
HCT: 39.2 % (ref 36.0–46.0)
Hemoglobin: 11.9 g/dL — ABNORMAL LOW (ref 12.0–15.0)
MCH: 29.3 pg (ref 26.0–34.0)
MCHC: 30.4 g/dL (ref 30.0–36.0)
MCV: 96.6 fL (ref 80.0–100.0)
Platelets: 198 10*3/uL (ref 150–400)
RBC: 4.06 MIL/uL (ref 3.87–5.11)
RDW: 15.6 % — ABNORMAL HIGH (ref 11.5–15.5)
WBC: 7.1 10*3/uL (ref 4.0–10.5)
nRBC: 0 % (ref 0.0–0.2)

## 2020-11-16 LAB — RENAL FUNCTION PANEL
Albumin: 3.4 g/dL — ABNORMAL LOW (ref 3.5–5.0)
Anion gap: 12 (ref 5–15)
BUN: 84 mg/dL — ABNORMAL HIGH (ref 8–23)
CO2: 33 mmol/L — ABNORMAL HIGH (ref 22–32)
Calcium: 8.3 mg/dL — ABNORMAL LOW (ref 8.9–10.3)
Chloride: 94 mmol/L — ABNORMAL LOW (ref 98–111)
Creatinine, Ser: 2.59 mg/dL — ABNORMAL HIGH (ref 0.44–1.00)
GFR, Estimated: 19 mL/min — ABNORMAL LOW (ref >60)
Glucose, Bld: 196 mg/dL — ABNORMAL HIGH (ref 70–99)
Phosphorus: 4 mg/dL (ref 2.5–4.6)
Potassium: 4.4 mmol/L (ref 3.5–5.1)
Sodium: 139 mmol/L (ref 135–145)

## 2020-11-16 LAB — GLUCOSE, CAPILLARY
Glucose-Capillary: 180 mg/dL — ABNORMAL HIGH (ref 70–99)
Glucose-Capillary: 250 mg/dL — ABNORMAL HIGH (ref 70–99)
Glucose-Capillary: 173 mg/dL — ABNORMAL HIGH (ref 70–99)
Glucose-Capillary: 142 mg/dL — ABNORMAL HIGH (ref 70–99)

## 2020-11-16 LAB — MAGNESIUM: Magnesium: 1.9 mg/dL (ref 1.7–2.4)

## 2020-11-16 MED ORDER — FUROSEMIDE 10 MG/ML IJ SOLN
10.0000 mg/h | INTRAVENOUS | Status: DC
  Administered 2020-11-16 – 2020-11-19 (×5): 10 mg/h via INTRAVENOUS
  Filled 2020-11-16 (×5): qty 20

## 2020-11-16 NOTE — Progress Notes (Signed)
PROGRESS NOTE    Denise Freeman  PJK:[id number] DOB: February 07, 1951 DOA: 11/13/2020 PCP: [name], NP   Brief Narrative:   Denise Freeman  is a 70 y.o. female, with history of CHF, COPD, diabetes mellitus type 2, GERD, CKD, and more presents the ED with a chief complaint of dyspnea.  She reports that her dyspnea started 2 or 3 weeks ago and has been progressively worse since then.  Patient was admitted with acute diastolic CHF exacerbation as well as COPD exacerbation along with AKI on CKD stage IIIa.  She was noted to have difficulty with diuresis and requires high dose of diuretics for which nephrology consultation was obtained to assist with volume overload.  Assessment & Plan:   Active Problems:   Chronic hypoxic, hypercapnic respiratory failure   COPD with asthma (HCC)   Acute renal failure (HCC)   DM type 2 causing vascular disease ([place])   Acute decompensated heart failure (HCC)   Acute on chronic diastolic (congestive) heart failure (HCC)   Acute on chronic diastolic CHF exacerbation -Likely cardiorenal syndrome -Increased IV Lasix to drip on 6/16 -Monitor strict I's and O's -2D echocardiogram ordered and pending -Foley present   AKI on CKD stage IIIa -In the setting of decompensated diastolic CHF consistent with cardiorenal syndrome -Appreciate nephrology assistance   Acute COPD exacerbation -BiPAP overnight -Nebulizers and steroids   Atrial fibrillation -Metoprolol and Eliquis   Type 2 diabetes -Continue SSI   GERD -Continue PPI     DVT prophylaxis:Eliquis Code Status: Full Family Communication: None at bedside Disposition Plan:  Status is: Inpatient   Remains inpatient appropriate because:IV treatments appropriate due to intensity of illness or inability to take PO   Dispo: The patient is from: Home              Anticipated d/c is to: Home with home health              Patient currently is not medically stable to d/c.              Difficult to  place patient No     Consultants:  Nephrology   Procedures:  See below   Antimicrobials:  None    Subjective: Patient seen and evaluated today with no new acute complaints or concerns. No acute concerns or events noted overnight.  She still has ongoing volume overload.  Objective: Vitals:   11/15/20 2128 11/15/20 2305 11/16/20 0508 11/16/20 0729  BP: (!) 103/54 136/75 124/85   Pulse: 90  84   Resp: 18  18   Temp: 97.8 F (36.6 C)  97.7 F (36.5 C)   TempSrc: Oral  Axillary   SpO2: 97%  98% 98%  Weight:   99.2 kg   Height:        Intake/Output Summary (Last 24 hours) at 11/16/2020 1212 Last data filed at 11/16/2020 0900 Gross per 24 hour  Intake 1285.18 ml  Output 900 ml  Net 385.18 ml   Filed Weights   11/14/20 0553 11/15/20 0441 11/16/20 0508  Weight: 103.3 kg 103.4 kg 99.2 kg    Examination:  General exam: Appears calm and comfortable, obese and on nasal CPAP Respiratory system: Clear to auscultation. Respiratory effort normal. Cardiovascular system: S1 & S2 heard, RRR.  Gastrointestinal system: Abdomen is soft Central nervous system: Alert and awake Extremities: 2+ pitting edema to lower extremities Skin: No significant lesions noted Psychiatry: Flat affect. Foley with clear, yellow urine output    Data Reviewed: I  have personally reviewed following labs and imaging studies  CBC: Recent Labs  Lab 11/13/20 1952 11/14/20 0538 11/15/20 0548 11/16/20 0521  WBC 9.5 6.9 6.2 7.1  NEUTROABS 7.1  --   --   --   HGB 12.7 12.4 11.3* 11.9*  HCT 42.4 41.8 37.3 39.2  MCV 99.8 100.0 97.6 96.6  PLT 186 171 182 638   Basic Metabolic Panel: Recent Labs  Lab 11/13/20 1952 11/14/20 0538 11/15/20 0548 11/16/20 0521  NA 136 139 137 139  K 4.8 5.1 4.6 4.4  CL 96* 97* 95* 94*  CO2 31 31 31  33*  GLUCOSE 129* 156* 184* 196*  BUN 65* 68* 76* 84*  CREATININE 2.32* 2.40* 2.52* 2.59*  CALCIUM 8.0* 8.6* 8.3* 8.3*  MG  --   --  1.8 1.9  PHOS  --   --  4.3 4.0    GFR: Estimated Creatinine Clearance: 21.2 mL/min (A) (by C-G formula based on SCr of 2.59 mg/dL (H)). Liver Function Tests: Recent Labs  Lab 11/13/20 1952 11/14/20 0538 11/15/20 0548 11/16/20 0521  AST 30 27  --   --   ALT 13 14  --   --   ALKPHOS 171* 172*  --   --   BILITOT 0.6 1.0  --   --   PROT 7.2 6.7  --   --   ALBUMIN 3.5 3.3* 3.3* 3.4*   No results for input(s): LIPASE, AMYLASE in the last 168 hours. No results for input(s): AMMONIA in the last 168 hours. Coagulation Profile: No results for input(s): INR, PROTIME in the last 168 hours. Cardiac Enzymes: No results for input(s): CKTOTAL, CKMB, CKMBINDEX, TROPONINI in the last 168 hours. BNP (last 3 results) No results for input(s): PROBNP in the last 8760 hours. HbA1C: Recent Labs    11/14/20 0538  HGBA1C 7.1*   CBG: Recent Labs  Lab 11/15/20 1724 11/15/20 2130 11/15/20 2331 11/16/20 0718 11/16/20 1117  GLUCAP 219* 190* 170* 180* 250*   Lipid Profile: No results for input(s): CHOL, HDL, LDLCALC, TRIG, CHOLHDL, LDLDIRECT in the last 72 hours. Thyroid Function Tests: No results for input(s): TSH, T4TOTAL, FREET4, T3FREE, THYROIDAB in the last 72 hours. Anemia Panel: No results for input(s): VITAMINB12, FOLATE, FERRITIN, TIBC, IRON, RETICCTPCT in the last 72 hours. Sepsis Labs: No results for input(s): PROCALCITON, LATICACIDVEN in the last 168 hours.  Recent Results (from the past 240 hour(s))  Resp Panel by RT-PCR (Flu A&B, Covid) Nasopharyngeal Swab     Status: None   Collection Time: 11/13/20  9:53 PM   Specimen: Nasopharyngeal Swab; Nasopharyngeal(NP) swabs in vial transport medium  Result Value Ref Range Status   SARS Coronavirus 2 by RT PCR NEGATIVE NEGATIVE Final    Comment: (NOTE) SARS-CoV-2 target nucleic acids are NOT DETECTED.  The SARS-CoV-2 RNA is generally detectable in upper respiratory specimens during the acute phase of infection. The lowest concentration of SARS-CoV-2 viral copies  this assay can detect is 138 copies/mL. A negative result does not preclude SARS-Cov-2 infection and should not be used as the sole basis for treatment or other patient management decisions. A negative result may occur with  improper specimen collection/handling, submission of specimen other than nasopharyngeal swab, presence of viral mutation(s) within the areas targeted by this assay, and inadequate number of viral copies(<138 copies/mL). A negative result must be combined with clinical observations, patient history, and epidemiological information. The expected result is Negative.  Fact Sheet for Patients:  EntrepreneurPulse.com.au  Fact Sheet for Healthcare  Providers:  IncredibleEmployment.be  This test is no t yet approved or cleared by the [place] and  has been authorized for detection and/or diagnosis of SARS-CoV-2 by FDA under an Emergency Use Authorization (EUA). This EUA will remain  in effect (meaning this test can be used) for the duration of the COVID-19 declaration under Section 564(b)(1) of the Act, 21 U.S.C.section 360bbb-3(b)(1), unless the authorization is terminated  or revoked sooner.       Influenza A by PCR NEGATIVE NEGATIVE Final   Influenza B by PCR NEGATIVE NEGATIVE Final    Comment: (NOTE) The Xpert Xpress SARS-CoV-2/FLU/RSV plus assay is intended as an aid in the diagnosis of influenza from Nasopharyngeal swab specimens and should not be used as a sole basis for treatment. Nasal washings and aspirates are unacceptable for Xpert Xpress SARS-CoV-2/FLU/RSV testing.  Fact Sheet for Patients: EntrepreneurPulse.com.au  Fact Sheet for Healthcare Providers: IncredibleEmployment.be  This test is not yet approved or cleared by the [place] FDA and has been authorized for detection and/or diagnosis of SARS-CoV-2 by FDA under an Emergency Use Authorization (EUA). This EUA  will remain in effect (meaning this test can be used) for the duration of the COVID-19 declaration under Section 564(b)(1) of the Act, 21 U.S.C. section 360bbb-3(b)(1), unless the authorization is terminated or revoked.  Performed at [facility], [address] [place], [place] [address]          Radiology Studies: No results found.      Scheduled Meds:  apixaban  5 mg Oral BID   atorvastatin  20 mg Oral Daily   budesonide  0.5 mg Nebulization BID   Chlorhexidine Gluconate Cloth  6 each Topical Daily   insulin aspart  0-15 Units Subcutaneous TID WC   insulin aspart  0-5 Units Subcutaneous QHS   insulin glargine  8 Units Subcutaneous QHS   ipratropium-albuterol  3 mL Nebulization TID   Living Better with Heart Failure Book   Does not apply Once   methylPREDNISolone (SOLU-MEDROL) injection  40 mg Intravenous BID   metolazone  5 mg Oral Daily   metoprolol succinate  37.5 mg Oral Daily   montelukast  10 mg Oral QHS   pantoprazole  40 mg Oral Daily   primidone  50 mg Oral QHS   Continuous Infusions:  furosemide (LASIX) 200 mg in dextrose 5% 100 mL (2mg /mL) infusion 10 mg/hr (11/16/20 1157)     LOS: 2 days    Time spent: 35 minutes    Francisco Ostrovsky Darleen Crocker, DO [facility]  If 7PM-7AM, please contact night-coverage [email or web address] 11/16/2020, 12:12 PM

## 2020-11-16 NOTE — Progress Notes (Signed)
Telemetry called. Patient had bigeminy PVC. MD Josephine Cables made aware. Will continue to monitor.

## 2020-11-16 NOTE — Progress Notes (Signed)
Nephrology Progress Note:    Patient ID: Denise Freeman, female   DOB: 06/04/1950, 70 y.o.   MRN: [id number]  S: had 900 mL UOP and 2 unmeasured voids over 6/15.  She had a foley placed.    Review of systems: reports shortness of breath but a little better since yesterday  Denies chest pain  Denies n/v  O:BP 124/85 (BP Location: Right Arm)   Pulse 84   Temp 97.7 F (36.5 C) (Axillary)   Resp 18   Ht 4\' 11"  (1.499 m)   Wt 99.2 kg   SpO2 98%   BMI 44.15 kg/m   Intake/Output Summary (Last 24 hours) at 11/16/2020 1008 Last data filed at 11/16/2020 0900 Gross per 24 hour  Intake 1285.18 ml  Output 900 ml  Net 385.18 ml   Intake/Output: I/O last 3 completed shifts: In: 1285.2 [P.O.:1077; IV Piggyback:208.2] Out: 1155 [Urine:1155]  Intake/Output this shift:  Total I/O In: 240 [P.O.:240] Out: -  Weight change: -4.244 kg  IEP:PIRJJ elderly wearing bipap HEENT NCAT CVS:S1S2 no rub Resp: clear but reduced; unlabored at rest Abd: soft/nontender/obese habitus Ext:2+ edema lower extremities  Psych normal mood and affect Neuro - alert and oriented and conversant  GU foley in place  Recent Labs  Lab 11/13/20 1952 11/14/20 0538 11/15/20 0548 11/16/20 0521  NA 136 139 137 139  K 4.8 5.1 4.6 4.4  CL 96* 97* 95* 94*  CO2 31 31 31  33*  GLUCOSE 129* 156* 184* 196*  BUN 65* 68* 76* 84*  CREATININE 2.32* 2.40* 2.52* 2.59*  ALBUMIN 3.5 3.3* 3.3* 3.4*  CALCIUM 8.0* 8.6* 8.3* 8.3*  PHOS  --   --  4.3 4.0  AST 30 27  --   --   ALT 13 14  --   --    Liver Function Tests: Recent Labs  Lab 11/13/20 1952 11/14/20 0538 11/15/20 0548 11/16/20 0521  AST 30 27  --   --   ALT 13 14  --   --   ALKPHOS 171* 172*  --   --   BILITOT 0.6 1.0  --   --   PROT 7.2 6.7  --   --   ALBUMIN 3.5 3.3* 3.3* 3.4*   No results for input(s): LIPASE, AMYLASE in the last 168 hours. No results for input(s): AMMONIA in the last 168 hours. CBC: Recent Labs  Lab 11/13/20 1952 11/14/20 0538  11/15/20 0548 11/16/20 0521  WBC 9.5 6.9 6.2 7.1  NEUTROABS 7.1  --   --   --   HGB 12.7 12.4 11.3* 11.9*  HCT 42.4 41.8 37.3 39.2  MCV 99.8 100.0 97.6 96.6  PLT 186 171 182 198   Cardiac Enzymes: No results for input(s): CKTOTAL, CKMB, CKMBINDEX, TROPONINI in the last 168 hours. CBG: Recent Labs  Lab 11/15/20 1133 11/15/20 1724 11/15/20 2130 11/15/20 2331 11/16/20 0718  GLUCAP 114* 219* 190* 170* 180*    Iron Studies: No results for input(s): IRON, TIBC, TRANSFERRIN, FERRITIN in the last 72 hours. Studies/Results: No results found.  apixaban  5 mg Oral BID   atorvastatin  20 mg Oral Daily   budesonide  0.5 mg Nebulization BID   Chlorhexidine Gluconate Cloth  6 each Topical Daily   insulin aspart  0-15 Units Subcutaneous TID WC   insulin aspart  0-5 Units Subcutaneous QHS   insulin glargine  8 Units Subcutaneous QHS   ipratropium-albuterol  3 mL Nebulization TID   Living Better with Heart Failure  Book   Does not apply Once   methylPREDNISolone (SOLU-MEDROL) injection  40 mg Intravenous BID   metolazone  5 mg Oral Daily   metoprolol succinate  37.5 mg Oral Daily   montelukast  10 mg Oral QHS   pantoprazole  40 mg Oral Daily   primidone  50 mg Oral QHS    BMET    Component Value Date/Time   NA 139 11/16/2020 0521   NA 139 02/15/2019 1202   K 4.4 11/16/2020 0521   CL 94 (L) 11/16/2020 0521   CO2 33 (H) 11/16/2020 0521   GLUCOSE 196 (H) 11/16/2020 0521   BUN 84 (H) 11/16/2020 0521   BUN 66 (H) 02/15/2019 1202   CREATININE 2.59 (H) 11/16/2020 0521   CREATININE 1.76 (H) 06/08/2020 1148   CALCIUM 8.3 (L) 11/16/2020 0521   GFRNONAA 19 (L) 11/16/2020 0521   GFRNONAA 28 (L) 11/05/2017 0927   GFRAA >60 05/02/2019 0636   GFRAA 32 (L) 11/05/2017 0927   CBC    Component Value Date/Time   WBC 7.1 11/16/2020 0521   RBC 4.06 11/16/2020 0521   HGB 11.9 (L) 11/16/2020 0521   HGB 10.7 (L) 07/29/2017 1115   HCT 39.2 11/16/2020 0521   HCT 33.2 (L) 07/29/2017 1115    PLT 198 11/16/2020 0521   MCV 96.6 11/16/2020 0521   MCV 85 07/29/2017 1115   MCH 29.3 11/16/2020 0521   MCHC 30.4 11/16/2020 0521   RDW 15.6 (H) 11/16/2020 0521   RDW 12.6 07/29/2017 1115   LYMPHSABS 1.3 11/13/2020 1952   LYMPHSABS 2.3 07/29/2017 1115   MONOABS 0.7 11/13/2020 1952   EOSABS 0.4 11/13/2020 1952   EOSABS 0.2 07/29/2017 1115   BASOSABS 0.0 11/13/2020 1952   BASOSABS 0.0 07/29/2017 1115    Assessment/Plan:  AKI/CKD stage IIIa - in setting of decompensated diastolic CHF and escalating diuretics consistent with cardiorenal syndrome.  Not much above her baseline of around 2.   Acute on chronic diastolic CHF - ECHO is pending.  Transition to lasix gtt.  has been on metolazone. COPD exacerbation - nebs and steroids per primary svc. Atrial fibrillation - on eliquis and metoprolol. Hyperkalemia - improved with medical measures  DM type 2 - per primary  Claudia Desanctis, MD 10:21 AM 11/16/2020

## 2020-11-17 ENCOUNTER — Ambulatory Visit (HOSPITAL_COMMUNITY): Payer: Medicare Other

## 2020-11-17 ENCOUNTER — Other Ambulatory Visit (HOSPITAL_COMMUNITY): Payer: Self-pay | Admitting: *Deleted

## 2020-11-17 ENCOUNTER — Inpatient Hospital Stay (HOSPITAL_COMMUNITY): Payer: Medicare Other

## 2020-11-17 DIAGNOSIS — I5033 Acute on chronic diastolic (congestive) heart failure: Secondary | ICD-10-CM

## 2020-11-17 DIAGNOSIS — I509 Heart failure, unspecified: Secondary | ICD-10-CM | POA: Diagnosis not present

## 2020-11-17 LAB — GLUCOSE, CAPILLARY
Glucose-Capillary: 155 mg/dL — ABNORMAL HIGH (ref 70–99)
Glucose-Capillary: 181 mg/dL — ABNORMAL HIGH (ref 70–99)
Glucose-Capillary: 184 mg/dL — ABNORMAL HIGH (ref 70–99)
Glucose-Capillary: 162 mg/dL — ABNORMAL HIGH (ref 70–99)

## 2020-11-17 LAB — ECHOCARDIOGRAM COMPLETE
AR max vel: 1.8 cm2
AV Area VTI: 2.01 cm2
AV Area mean vel: 1.81 cm2
AV Mean grad: 2.4 mmHg
AV Peak grad: 5.1 mmHg
Ao pk vel: 1.13 m/s
Area-P 1/2: 5.31 cm2
Height: 59 in
S' Lateral: 3.6 cm
Weight: 3509.72 oz

## 2020-11-17 LAB — RENAL FUNCTION PANEL
Albumin: 3 g/dL — ABNORMAL LOW (ref 3.5–5.0)
Anion gap: 12 (ref 5–15)
BUN: 85 mg/dL — ABNORMAL HIGH (ref 8–23)
CO2: 33 mmol/L — ABNORMAL HIGH (ref 22–32)
Calcium: 8.1 mg/dL — ABNORMAL LOW (ref 8.9–10.3)
Chloride: 93 mmol/L — ABNORMAL LOW (ref 98–111)
Creatinine, Ser: 2.25 mg/dL — ABNORMAL HIGH (ref 0.44–1.00)
GFR, Estimated: 23 mL/min — ABNORMAL LOW (ref >60)
Glucose, Bld: 201 mg/dL — ABNORMAL HIGH (ref 70–99)
Phosphorus: 3.7 mg/dL (ref 2.5–4.6)
Potassium: 3.7 mmol/L (ref 3.5–5.1)
Sodium: 138 mmol/L (ref 135–145)

## 2020-11-17 LAB — MAGNESIUM: Magnesium: 1.8 mg/dL (ref 1.7–2.4)

## 2020-11-17 MED ORDER — ALBUTEROL SULFATE (2.5 MG/3ML) 0.083% IN NEBU: 2.5000 mg | INHALATION_SOLUTION | RESPIRATORY_TRACT | Status: DC | PRN

## 2020-11-17 NOTE — Progress Notes (Signed)
PROGRESS NOTE    Denise Freeman  CWC:[id number] DOB: 1951/06/01 DOA: 11/13/2020 PCP: [name], NP   Brief Narrative:   Denise Freeman  is a 70 y.o. female, with history of CHF, COPD, diabetes mellitus type 2, GERD, CKD, and more presents the ED with a chief complaint of dyspnea.  She reports that her dyspnea started 2 or 3 weeks ago and has been progressively worse since then.  Patient was admitted with acute diastolic CHF exacerbation as well as COPD exacerbation along with AKI on CKD stage IIIa.  She was noted to have difficulty with diuresis and requires high dose of diuretics for which nephrology consultation was obtained to assist with volume overload.  Assessment & Plan:   Active Problems:   Chronic hypoxic, hypercapnic respiratory failure   COPD with asthma (HCC)   Acute renal failure (HCC)   DM type 2 causing vascular disease ([place])   Acute decompensated heart failure (HCC)   Acute on chronic diastolic (congestive) heart failure (HCC)   Acute on chronic diastolic CHF exacerbation -Likely cardiorenal syndrome -Increased IV Lasix to drip on 6/16 -Monitor strict I's and O's -2D echocardiogram ordered and pending -Foley present   AKI on CKD stage IIIa -In the setting of decompensated diastolic CHF consistent with cardiorenal syndrome -Appreciate nephrology assistance -Follow am labs   Acute COPD exacerbation -BiPAP overnight -Nebulizers and steroids   Atrial fibrillation -Metoprolol and Eliquis   Type 2 diabetes -Continue SSI   GERD -Continue PPI     DVT prophylaxis:Eliquis Code Status: Full Family Communication: None at bedside Disposition Plan:  Status is: Inpatient   Remains inpatient appropriate because:IV treatments appropriate due to intensity of illness or inability to take PO   Dispo: The patient is from: Home              Anticipated d/c is to: Home with home health              Patient currently is not medically stable to d/c.               Difficult to place patient No     Consultants:  Nephrology   Procedures:  See below   Antimicrobials:  None  Subjective: Patient seen and evaluated today with no new acute complaints or concerns. No acute concerns or events noted overnight.  Objective: Vitals:   11/17/20 0455 11/17/20 0500 11/17/20 0759 11/17/20 0803  BP: 99/60     Pulse: 86     Resp: 20     Temp: 97.9 F (36.6 C)     TempSrc: Oral     SpO2: 100%  99% 100%  Weight:  99.5 kg    Height:        Intake/Output Summary (Last 24 hours) at 11/17/2020 1312 Last data filed at 11/17/2020 0900 Gross per 24 hour  Intake 1014.99 ml  Output 2710 ml  Net -1695.01 ml   Filed Weights   11/15/20 0441 11/16/20 0508 11/17/20 0500  Weight: 103.4 kg 99.2 kg 99.5 kg    Examination:  General exam: Appears calm and comfortable  Respiratory system: Clear to auscultation. Respiratory effort normal. On [place] Cardiovascular system: S1 & S2 heard, RRR.  Gastrointestinal system: Abdomen is soft Central nervous system: Alert and awake Extremities: 1+ edema Skin: No significant lesions noted Psychiatry: Flat affect.    Data Reviewed: I have personally reviewed following labs and imaging studies  CBC: Recent Labs  Lab 11/13/20 1952 11/14/20 7616 11/15/20 0548 11/16/20 0737  WBC 9.5 6.9 6.2 7.1  NEUTROABS 7.1  --   --   --   HGB 12.7 12.4 11.3* 11.9*  HCT 42.4 41.8 37.3 39.2  MCV 99.8 100.0 97.6 96.6  PLT 186 171 182 338   Basic Metabolic Panel: Recent Labs  Lab 11/13/20 1952 11/14/20 0538 11/15/20 0548 11/16/20 0521 11/17/20 0621  NA 136 139 137 139 138  K 4.8 5.1 4.6 4.4 3.7  CL 96* 97* 95* 94* 93*  CO2 31 31 31  33* 33*  GLUCOSE 129* 156* 184* 196* 201*  BUN 65* 68* 76* 84* 85*  CREATININE 2.32* 2.40* 2.52* 2.59* 2.25*  CALCIUM 8.0* 8.6* 8.3* 8.3* 8.1*  MG  --   --  1.8 1.9 1.8  PHOS  --   --  4.3 4.0 3.7   GFR: Estimated Creatinine Clearance: 24.5 mL/min (A) (by C-G formula based on SCr of 2.25  mg/dL (H)). Liver Function Tests: Recent Labs  Lab 11/13/20 1952 11/14/20 0538 11/15/20 0548 11/16/20 0521 11/17/20 0621  AST 30 27  --   --   --   ALT 13 14  --   --   --   ALKPHOS 171* 172*  --   --   --   BILITOT 0.6 1.0  --   --   --   PROT 7.2 6.7  --   --   --   ALBUMIN 3.5 3.3* 3.3* 3.4* 3.0*   No results for input(s): LIPASE, AMYLASE in the last 168 hours. No results for input(s): AMMONIA in the last 168 hours. Coagulation Profile: No results for input(s): INR, PROTIME in the last 168 hours. Cardiac Enzymes: No results for input(s): CKTOTAL, CKMB, CKMBINDEX, TROPONINI in the last 168 hours. BNP (last 3 results) No results for input(s): PROBNP in the last 8760 hours. HbA1C: No results for input(s): HGBA1C in the last 72 hours. CBG: Recent Labs  Lab 11/16/20 1117 11/16/20 1612 11/16/20 2228 11/17/20 0749 11/17/20 1148  GLUCAP 250* 173* 142* 155* 181*   Lipid Profile: No results for input(s): CHOL, HDL, LDLCALC, TRIG, CHOLHDL, LDLDIRECT in the last 72 hours. Thyroid Function Tests: No results for input(s): TSH, T4TOTAL, FREET4, T3FREE, THYROIDAB in the last 72 hours. Anemia Panel: No results for input(s): VITAMINB12, FOLATE, FERRITIN, TIBC, IRON, RETICCTPCT in the last 72 hours. Sepsis Labs: No results for input(s): PROCALCITON, LATICACIDVEN in the last 168 hours.  Recent Results (from the past 240 hour(s))  Resp Panel by RT-PCR (Flu A&B, Covid) Nasopharyngeal Swab     Status: None   Collection Time: 11/13/20  9:53 PM   Specimen: Nasopharyngeal Swab; Nasopharyngeal(NP) swabs in vial transport medium  Result Value Ref Range Status   SARS Coronavirus 2 by RT PCR NEGATIVE NEGATIVE Final    Comment: (NOTE) SARS-CoV-2 target nucleic acids are NOT DETECTED.  The SARS-CoV-2 RNA is generally detectable in upper respiratory specimens during the acute phase of infection. The lowest concentration of SARS-CoV-2 viral copies this assay can detect is 138 copies/mL. A  negative result does not preclude SARS-Cov-2 infection and should not be used as the sole basis for treatment or other patient management decisions. A negative result may occur with  improper specimen collection/handling, submission of specimen other than nasopharyngeal swab, presence of viral mutation(s) within the areas targeted by this assay, and inadequate number of viral copies(<138 copies/mL). A negative result must be combined with clinical observations, patient history, and epidemiological information. The expected result is Negative.  Fact Sheet for Patients:  EntrepreneurPulse.com.au  Fact Sheet for Healthcare Providers:  IncredibleEmployment.be  This test is no t yet approved or cleared by the [place] FDA and  has been authorized for detection and/or diagnosis of SARS-CoV-2 by FDA under an Emergency Use Authorization (EUA). This EUA will remain  in effect (meaning this test can be used) for the duration of the COVID-19 declaration under Section 564(b)(1) of the Act, 21 U.S.C.section 360bbb-3(b)(1), unless the authorization is terminated  or revoked sooner.       Influenza A by PCR NEGATIVE NEGATIVE Final   Influenza B by PCR NEGATIVE NEGATIVE Final    Comment: (NOTE) The Xpert Xpress SARS-CoV-2/FLU/RSV plus assay is intended as an aid in the diagnosis of influenza from Nasopharyngeal swab specimens and should not be used as a sole basis for treatment. Nasal washings and aspirates are unacceptable for Xpert Xpress SARS-CoV-2/FLU/RSV testing.  Fact Sheet for Patients: EntrepreneurPulse.com.au  Fact Sheet for Healthcare Providers: IncredibleEmployment.be  This test is not yet approved or cleared by the [place] FDA and has been authorized for detection and/or diagnosis of SARS-CoV-2 by FDA under an Emergency Use Authorization (EUA). This EUA will remain in effect (meaning this test can  be used) for the duration of the COVID-19 declaration under Section 564(b)(1) of the Act, 21 U.S.C. section 360bbb-3(b)(1), unless the authorization is terminated or revoked.  Performed at [facility], [address] [place], [place] [address]          Radiology Studies: No results found.      Scheduled Meds:  apixaban  5 mg Oral BID   atorvastatin  20 mg Oral Daily   budesonide  0.5 mg Nebulization BID   Chlorhexidine Gluconate Cloth  6 each Topical Daily   insulin aspart  0-15 Units Subcutaneous TID WC   insulin aspart  0-5 Units Subcutaneous QHS   insulin glargine  8 Units Subcutaneous QHS   ipratropium-albuterol  3 mL Nebulization TID   Living Better with Heart Failure Book   Does not apply Once   methylPREDNISolone (SOLU-MEDROL) injection  40 mg Intravenous BID   metolazone  5 mg Oral Daily   metoprolol succinate  37.5 mg Oral Daily   montelukast  10 mg Oral QHS   pantoprazole  40 mg Oral Daily   primidone  50 mg Oral QHS   Continuous Infusions:  furosemide (LASIX) 200 mg in dextrose 5% 100 mL (2mg /mL) infusion 10 mg/hr (11/17/20 0343)     LOS: 3 days    Time spent: 35 minutes    Navi Ewton Darleen Crocker, DO [facility]  If 7PM-7AM, please contact night-coverage [email or web address] 11/17/2020, 1:12 PM

## 2020-11-17 NOTE — Progress Notes (Signed)
Nephrology Progress Note:    Patient ID: Denise Freeman, female   DOB: 02-Mar-1951, 70 y.o.   MRN: [id number]  S: had 2.6 L UOP over 6/16.  Her husband is at bedside.  She has been trying to keep her legs up.   Review of systems: reports shortness of breath but states that this has improved since yesterday  Denies chest pain  Denies n/v  O:BP 99/60 (BP Location: Right Arm)   Pulse 86   Temp 97.9 F (36.6 C) (Oral)   Resp 20   Ht 4\' 11"  (1.499 m)   Wt 99.5 kg   SpO2 100%   BMI 44.30 kg/m   Intake/Output Summary (Last 24 hours) at 11/17/2020 1028 Last data filed at 11/17/2020 0900 Gross per 24 hour  Intake 1014.99 ml  Output 3260 ml  Net -2245.01 ml   Intake/Output: I/O last 3 completed shifts: In: 1509 [P.O.:1297; I.V.:75; IV Piggyback:137] Out: 3500 [Urine:3500]  Intake/Output this shift:  Total I/O In: 240 [P.O.:240] Out: 660 [Urine:660] Weight change: 0.344 kg  Gen elderly female seated in bed; obese habitus  HEENT NCAT CVS:S1S2 no rub Resp: clear but reduced; unlabored at rest Abd: soft/nontender/obese habitus Ext: 2+ edema lower extremities  Psych normal mood and affect Neuro - alert and oriented and conversant  GU foley in place  Recent Labs  Lab 11/13/20 1952 11/14/20 0538 11/15/20 0548 11/16/20 0521 11/17/20 0621  NA 136 139 137 139 138  K 4.8 5.1 4.6 4.4 3.7  CL 96* 97* 95* 94* 93*  CO2 31 31 31  33* 33*  GLUCOSE 129* 156* 184* 196* 201*  BUN 65* 68* 76* 84* 85*  CREATININE 2.32* 2.40* 2.52* 2.59* 2.25*  ALBUMIN 3.5 3.3* 3.3* 3.4* 3.0*  CALCIUM 8.0* 8.6* 8.3* 8.3* 8.1*  PHOS  --   --  4.3 4.0 3.7  AST 30 27  --   --   --   ALT 13 14  --   --   --    Liver Function Tests: Recent Labs  Lab 11/13/20 1952 11/14/20 0538 11/15/20 0548 11/16/20 0521 11/17/20 0621  AST 30 27  --   --   --   ALT 13 14  --   --   --   ALKPHOS 171* 172*  --   --   --   BILITOT 0.6 1.0  --   --   --   PROT 7.2 6.7  --   --   --   ALBUMIN 3.5 3.3* 3.3* 3.4* 3.0*    No results for input(s): LIPASE, AMYLASE in the last 168 hours. No results for input(s): AMMONIA in the last 168 hours. CBC: Recent Labs  Lab 11/13/20 1952 11/14/20 0538 11/15/20 0548 11/16/20 0521  WBC 9.5 6.9 6.2 7.1  NEUTROABS 7.1  --   --   --   HGB 12.7 12.4 11.3* 11.9*  HCT 42.4 41.8 37.3 39.2  MCV 99.8 100.0 97.6 96.6  PLT 186 171 182 198   Cardiac Enzymes: No results for input(s): CKTOTAL, CKMB, CKMBINDEX, TROPONINI in the last 168 hours. CBG: Recent Labs  Lab 11/16/20 0718 11/16/20 1117 11/16/20 1612 11/16/20 2228 11/17/20 0749  GLUCAP 180* 250* 173* 142* 155*    Iron Studies: No results for input(s): IRON, TIBC, TRANSFERRIN, FERRITIN in the last 72 hours. Studies/Results: No results found.  apixaban  5 mg Oral BID   atorvastatin  20 mg Oral Daily   budesonide  0.5 mg Nebulization BID   Chlorhexidine  Gluconate Cloth  6 each Topical Daily   insulin aspart  0-15 Units Subcutaneous TID WC   insulin aspart  0-5 Units Subcutaneous QHS   insulin glargine  8 Units Subcutaneous QHS   ipratropium-albuterol  3 mL Nebulization TID   Living Better with Heart Failure Book   Does not apply Once   methylPREDNISolone (SOLU-MEDROL) injection  40 mg Intravenous BID   metolazone  5 mg Oral Daily   metoprolol succinate  37.5 mg Oral Daily   montelukast  10 mg Oral QHS   pantoprazole  40 mg Oral Daily   primidone  50 mg Oral QHS    BMET    Component Value Date/Time   NA 138 11/17/2020 0621   NA 139 02/15/2019 1202   K 3.7 11/17/2020 0621   CL 93 (L) 11/17/2020 0621   CO2 33 (H) 11/17/2020 0621   GLUCOSE 201 (H) 11/17/2020 0621   BUN 85 (H) 11/17/2020 0621   BUN 66 (H) 02/15/2019 1202   CREATININE 2.25 (H) 11/17/2020 0621   CREATININE 1.76 (H) 06/08/2020 1148   CALCIUM 8.1 (L) 11/17/2020 0621   GFRNONAA 23 (L) 11/17/2020 0621   GFRNONAA 28 (L) 11/05/2017 0927   GFRAA >60 05/02/2019 0636   GFRAA 32 (L) 11/05/2017 0927   CBC    Component Value Date/Time    WBC 7.1 11/16/2020 0521   RBC 4.06 11/16/2020 0521   HGB 11.9 (L) 11/16/2020 0521   HGB 10.7 (L) 07/29/2017 1115   HCT 39.2 11/16/2020 0521   HCT 33.2 (L) 07/29/2017 1115   PLT 198 11/16/2020 0521   MCV 96.6 11/16/2020 0521   MCV 85 07/29/2017 1115   MCH 29.3 11/16/2020 0521   MCHC 30.4 11/16/2020 0521   RDW 15.6 (H) 11/16/2020 0521   RDW 12.6 07/29/2017 1115   LYMPHSABS 1.3 11/13/2020 1952   LYMPHSABS 2.3 07/29/2017 1115   MONOABS 0.7 11/13/2020 1952   EOSABS 0.4 11/13/2020 1952   EOSABS 0.2 07/29/2017 1115   BASOSABS 0.0 11/13/2020 1952   BASOSABS 0.0 07/29/2017 1115    Assessment/Plan:  AKI/CKD stage IIIa - in setting of decompensated diastolic CHF and escalating diuretics consistent with cardiorenal syndrome.  Not much above her baseline of around 2.   Acute on chronic diastolic CHF - ECHO is pending.  Transitioned to lasix gtt on 6/16.  Continue same for now.  Improved urine output.  Continue metolazone  COPD exacerbation - nebs and steroids per primary svc. Atrial fibrillation - on eliquis and metoprolol. Hyperkalemia - resolved with medical measures  DM type 2 - per primary  Nephrology will see over the weekend   Claudia Desanctis, MD 10:39 AM 11/17/2020

## 2020-11-17 NOTE — Progress Notes (Signed)
*  PRELIMINARY RESULTS* Echocardiogram 2D Echocardiogram has been performed.  Denise Freeman 11/17/2020, 5:15 PM

## 2020-11-17 NOTE — Progress Notes (Signed)
Physical Therapy Treatment Patient Details Name: GAYNA BRADDY MRN: [id number] DOB: 1951-05-24 Today's Date: 11/17/2020    History of Present Illness Erminie Wandel is a 70 y.o. female with c/o dyspnea x3 weeks, endorses nonproductive coughing, leg swelling and weight gain. Chest x-ray shows a right pleural effusion and right lung base atelectasis. PMH: CHF, COPD, diabetes mellitus type 2, GERD, CKD, asthma, afib, bipolar 1 disorder, partially blind, fibromyalgia, OSA on BiPAP, on 3L chronically, cervical disc surgery 2009    PT Comments    Patient seated EOB at beginning of session. Patient able to transfer to standing with RW. She requires some cueing for navigation of RW in room but is able to ambulate increased distance today with RW without loss of balance. Patient ends session seated in chair with family present. Patient will benefit from continued physical therapy in hospital and recommended venue below to increase strength, balance, endurance for safe ADLs and gait.    Follow Up Recommendations  Home health PT;Supervision/Assistance - 24 hour     Equipment Recommendations  None recommended by PT    Recommendations for Other Services       Precautions / Restrictions Precautions Precautions: Fall Precaution Comments: chronic 3L O2 Restrictions Weight Bearing Restrictions: No    Mobility  Bed Mobility               General bed mobility comments: seated EOB at beginning of session    Transfers Overall transfer level: Needs assistance Equipment used: Rolling walker (2 wheeled)   Sit to Stand: Min guard Stand pivot transfers: Min guard       General transfer comment: transfers to standing with RW  Ambulation/Gait Ambulation/Gait assistance: Min guard Gait Distance (Feet): 80 Feet Assistive device: Rolling walker (2 wheeled) Gait Pattern/deviations: Step-to pattern;Decreased stride length;Step-through pattern Gait velocity: decreased   General Gait Details:  short, steady, slow step to steps with RW, no LOB, 1 standing rest break   Stairs             Wheelchair Mobility    Modified Rankin (Stroke Patients Only)       Balance Overall balance assessment: Needs assistance Sitting-balance support: Feet supported Sitting balance-Leahy Scale: Good Sitting balance - Comments: seated EOB   Standing balance support: During functional activity;Bilateral upper extremity supported Standing balance-Leahy Scale: Fair Standing balance comment: with RW                            Cognition Arousal/Alertness: Awake/alert Behavior During Therapy: WFL for tasks assessed/performed Overall Cognitive Status: Within Functional Limits for tasks assessed                                        Exercises      General Comments        Pertinent Vitals/Pain Pain Assessment: No/denies pain    Home Living                      Prior Function            PT Goals (current goals can now be found in the care plan section) Acute Rehab PT Goals Patient Stated Goal: home with spouse and HHPT PT Goal Formulation: With patient Time For Goal Achievement: 11/28/20 Potential to Achieve Goals: Good Progress towards PT goals: Progressing toward goals    Frequency  Min 3X/week      PT Plan Current plan remains appropriate    Co-evaluation              AM-PAC PT "6 Clicks" Mobility   Outcome Measure  Help needed turning from your back to your side while in a flat bed without using bedrails?: A Little Help needed moving from lying on your back to sitting on the side of a flat bed without using bedrails?: A Little Help needed moving to and from a bed to a chair (including a wheelchair)?: A Little Help needed standing up from a chair using your arms (e.g., wheelchair or bedside chair)?: A Little Help needed to walk in hospital room?: A Little Help needed climbing 3-5 steps with a railing? : A  Little 6 Click Score: 18    End of Session Equipment Utilized During Treatment: Oxygen Activity Tolerance: Patient tolerated treatment well Patient left: with call bell/phone within reach;in chair;with family/visitor present Nurse Communication: Mobility status PT Visit Diagnosis: Unsteadiness on feet (R26.81);Other abnormalities of gait and mobility (R26.89);Muscle weakness (generalized) (M62.81)     Time: 3361-2244 PT Time Calculation (min) (ACUTE ONLY): 18 min  Charges:  $Therapeutic Activity: 8-22 mins                    11:54 AM, 11/17/20 Mearl Latin PT, DPT Physical Therapist at [facility]

## 2020-11-18 DIAGNOSIS — I509 Heart failure, unspecified: Secondary | ICD-10-CM | POA: Diagnosis not present

## 2020-11-18 LAB — RENAL FUNCTION PANEL
Albumin: 3.3 g/dL — ABNORMAL LOW (ref 3.5–5.0)
Anion gap: 11 (ref 5–15)
BUN: 88 mg/dL — ABNORMAL HIGH (ref 8–23)
CO2: 36 mmol/L — ABNORMAL HIGH (ref 22–32)
Calcium: 8.4 mg/dL — ABNORMAL LOW (ref 8.9–10.3)
Chloride: 92 mmol/L — ABNORMAL LOW (ref 98–111)
Creatinine, Ser: 2.05 mg/dL — ABNORMAL HIGH (ref 0.44–1.00)
GFR, Estimated: 26 mL/min — ABNORMAL LOW (ref >60)
Glucose, Bld: 198 mg/dL — ABNORMAL HIGH (ref 70–99)
Phosphorus: 3.4 mg/dL (ref 2.5–4.6)
Potassium: 3.5 mmol/L (ref 3.5–5.1)
Sodium: 139 mmol/L (ref 135–145)

## 2020-11-18 LAB — MAGNESIUM: Magnesium: 1.8 mg/dL (ref 1.7–2.4)

## 2020-11-18 LAB — GLUCOSE, CAPILLARY
Glucose-Capillary: 181 mg/dL — ABNORMAL HIGH (ref 70–99)
Glucose-Capillary: 188 mg/dL — ABNORMAL HIGH (ref 70–99)
Glucose-Capillary: 173 mg/dL — ABNORMAL HIGH (ref 70–99)

## 2020-11-18 MED ORDER — POTASSIUM CHLORIDE CRYS ER 20 MEQ PO TBCR
40.0000 meq | EXTENDED_RELEASE_TABLET | Freq: Once | ORAL | Status: AC
  Administered 2020-11-18: 40 meq via ORAL
  Filled 2020-11-18: qty 2

## 2020-11-18 MED ORDER — PREDNISONE 20 MG PO TABS
40.0000 mg | ORAL_TABLET | Freq: Every day | ORAL | Status: DC
  Administered 2020-11-19: 40 mg via ORAL
  Filled 2020-11-18: qty 2

## 2020-11-18 NOTE — Progress Notes (Signed)
PROGRESS NOTE    Denise Freeman  [name]:[id number] DOB: 11/20/1950 DOA: 11/13/2020 PCP: [name], NP   Brief Narrative:   Denise Freeman  is a 70 y.o. female, with history of CHF, COPD, diabetes mellitus type 2, GERD, CKD, and more presents the ED with a chief complaint of dyspnea.  She reports that her dyspnea started 2 or 3 weeks ago and has been progressively worse since then.  Patient was admitted with acute diastolic CHF exacerbation as well as COPD exacerbation along with AKI on CKD stage IIIa.  She was noted to have difficulty with diuresis and requires high dose of diuretics for which nephrology consultation was obtained to assist with volume overload.  Assessment & Plan:   Active Problems:   Chronic hypoxic, hypercapnic respiratory failure   COPD with asthma (HCC)   Acute renal failure (HCC)   DM type 2 causing vascular disease ([place])   Acute decompensated heart failure (HCC)   Acute on chronic diastolic (congestive) heart failure (HCC)   Acute on chronic diastolic CHF exacerbation -Likely cardiorenal syndrome -Increased IV Lasix to drip on 6/16 -Monitor strict I's and O's -2D echocardiogram with LVEF 50% and no other acute findings -Foley present with over 3 L fluid output in the last 24 hours   AKI on CKD stage IIIa-stable -In the setting of decompensated diastolic CHF consistent with cardiorenal syndrome -Appreciate nephrology assistance -Follow am labs   Acute COPD exacerbation-resolved -Nebulizers to as needed and steroids weaned   Atrial fibrillation -Metoprolol and Eliquis   Type 2 diabetes -Continue SSI   GERD -Continue PPI     DVT prophylaxis:Eliquis Code Status: Full Family Communication: None at bedside Disposition Plan:  Status is: Inpatient   Remains inpatient appropriate because:IV treatments appropriate due to intensity of illness or inability to take PO   Dispo: The patient is from: Home              Anticipated d/c is to: Home with  home health              Patient currently is not medically stable to d/c.              Difficult to place patient No     Consultants:  Nephrology   Procedures:  See below   Antimicrobials:  None  Subjective: Patient seen and evaluated today with some complaints of mild leg pain with leg elevation and request that she be able to move around some.  Objective: Vitals:   11/18/20 0541 11/18/20 0542 11/18/20 0802 11/18/20 0808  BP: 131/78     Pulse: 89     Resp: 19     Temp: 97.6 F (36.4 C)     TempSrc: Oral     SpO2: 99%  99% 100%  Weight:  100.5 kg    Height:        Intake/Output Summary (Last 24 hours) at 11/18/2020 1108 Last data filed at 11/18/2020 1000 Gross per 24 hour  Intake 480 ml  Output 5810 ml  Net -5330 ml   Filed Weights   11/16/20 0508 11/17/20 0500 11/18/20 0542  Weight: 99.2 kg 99.5 kg 100.5 kg    Examination:  General exam: Appears calm and comfortable, obese Respiratory system: Clear to auscultation. Respiratory effort normal.  4 L nasal cannula Cardiovascular system: S1 & S2 heard, RRR.  Gastrointestinal system: Abdomen is soft Central nervous system: Alert and awake Extremities: 1+ pitting edema bilateral lower extremities Skin: No significant lesions noted  Psychiatry: Flat affect.    Data Reviewed: I have personally reviewed following labs and imaging studies  CBC: Recent Labs  Lab 11/13/20 1952 11/14/20 0538 11/15/20 0548 11/16/20 0521  WBC 9.5 6.9 6.2 7.1  NEUTROABS 7.1  --   --   --   HGB 12.7 12.4 11.3* 11.9*  HCT 42.4 41.8 37.3 39.2  MCV 99.8 100.0 97.6 96.6  PLT 186 171 182 045   Basic Metabolic Panel: Recent Labs  Lab 11/14/20 0538 11/15/20 0548 11/16/20 0521 11/17/20 0621 11/18/20 0645  NA 139 137 139 138 139  K 5.1 4.6 4.4 3.7 3.5  CL 97* 95* 94* 93* 92*  CO2 31 31 33* 33* 36*  GLUCOSE 156* 184* 196* 201* 198*  BUN 68* 76* 84* 85* 88*  CREATININE 2.40* 2.52* 2.59* 2.25* 2.05*  CALCIUM 8.6* 8.3* 8.3* 8.1*  8.4*  MG  --  1.8 1.9 1.8 1.8  PHOS  --  4.3 4.0 3.7 3.4   GFR: Estimated Creatinine Clearance: 27 mL/min (A) (by C-G formula based on SCr of 2.05 mg/dL (H)). Liver Function Tests: Recent Labs  Lab 11/13/20 1952 11/14/20 0538 11/15/20 0548 11/16/20 0521 11/17/20 0621 11/18/20 0645  AST 30 27  --   --   --   --   ALT 13 14  --   --   --   --   ALKPHOS 171* 172*  --   --   --   --   BILITOT 0.6 1.0  --   --   --   --   PROT 7.2 6.7  --   --   --   --   ALBUMIN 3.5 3.3* 3.3* 3.4* 3.0* 3.3*   No results for input(s): LIPASE, AMYLASE in the last 168 hours. No results for input(s): AMMONIA in the last 168 hours. Coagulation Profile: No results for input(s): INR, PROTIME in the last 168 hours. Cardiac Enzymes: No results for input(s): CKTOTAL, CKMB, CKMBINDEX, TROPONINI in the last 168 hours. BNP (last 3 results) No results for input(s): PROBNP in the last 8760 hours. HbA1C: No results for input(s): HGBA1C in the last 72 hours. CBG: Recent Labs  Lab 11/17/20 0749 11/17/20 1148 11/17/20 1706 11/17/20 2057 11/18/20 0736  GLUCAP 155* 181* 184* 162* 181*   Lipid Profile: No results for input(s): CHOL, HDL, LDLCALC, TRIG, CHOLHDL, LDLDIRECT in the last 72 hours. Thyroid Function Tests: No results for input(s): TSH, T4TOTAL, FREET4, T3FREE, THYROIDAB in the last 72 hours. Anemia Panel: No results for input(s): VITAMINB12, FOLATE, FERRITIN, TIBC, IRON, RETICCTPCT in the last 72 hours. Sepsis Labs: No results for input(s): PROCALCITON, LATICACIDVEN in the last 168 hours.  Recent Results (from the past 240 hour(s))  Resp Panel by RT-PCR (Flu A&B, Covid) Nasopharyngeal Swab     Status: None   Collection Time: 11/13/20  9:53 PM   Specimen: Nasopharyngeal Swab; Nasopharyngeal(NP) swabs in vial transport medium  Result Value Ref Range Status   SARS Coronavirus 2 by RT PCR NEGATIVE NEGATIVE Final    Comment: (NOTE) SARS-CoV-2 target nucleic acids are NOT DETECTED.  The  SARS-CoV-2 RNA is generally detectable in upper respiratory specimens during the acute phase of infection. The lowest concentration of SARS-CoV-2 viral copies this assay can detect is 138 copies/mL. A negative result does not preclude SARS-Cov-2 infection and should not be used as the sole basis for treatment or other patient management decisions. A negative result may occur with  improper specimen collection/handling, submission of specimen other than  nasopharyngeal swab, presence of viral mutation(s) within the areas targeted by this assay, and inadequate number of viral copies(<138 copies/mL). A negative result must be combined with clinical observations, patient history, and epidemiological information. The expected result is Negative.  Fact Sheet for Patients:  EntrepreneurPulse.com.au  Fact Sheet for Healthcare Providers:  IncredibleEmployment.be  This test is no t yet approved or cleared by the [place] FDA and  has been authorized for detection and/or diagnosis of SARS-CoV-2 by FDA under an Emergency Use Authorization (EUA). This EUA will remain  in effect (meaning this test can be used) for the duration of the COVID-19 declaration under Section 564(b)(1) of the Act, 21 U.S.C.section 360bbb-3(b)(1), unless the authorization is terminated  or revoked sooner.       Influenza A by PCR NEGATIVE NEGATIVE Final   Influenza B by PCR NEGATIVE NEGATIVE Final    Comment: (NOTE) The Xpert Xpress SARS-CoV-2/FLU/RSV plus assay is intended as an aid in the diagnosis of influenza from Nasopharyngeal swab specimens and should not be used as a sole basis for treatment. Nasal washings and aspirates are unacceptable for Xpert Xpress SARS-CoV-2/FLU/RSV testing.  Fact Sheet for Patients: EntrepreneurPulse.com.au  Fact Sheet for Healthcare Providers: IncredibleEmployment.be  This test is not yet approved or  cleared by the [place] FDA and has been authorized for detection and/or diagnosis of SARS-CoV-2 by FDA under an Emergency Use Authorization (EUA). This EUA will remain in effect (meaning this test can be used) for the duration of the COVID-19 declaration under Section 564(b)(1) of the Act, 21 U.S.C. section 360bbb-3(b)(1), unless the authorization is terminated or revoked.  Performed at [facility], [address] [place], [place] [address]          Radiology Studies: ECHOCARDIOGRAM COMPLETE  Result Date: 11/17/2020    ECHOCARDIOGRAM REPORT   Patient Name:   Denise Freeman Date of Exam: 11/17/2020 Medical Rec #:  [id number]     Height:       59.0 in Accession #:    [id number]    Weight:       219.4 lb Date of Birth:  02-Jan-1951    BSA:          1.918 m Patient Age:    68 years      BP:           99/67 mmHg Patient Gender: F             HR:           82 bpm. Exam Location:  [facility] Procedure: 2D Echo, Cardiac Doppler and Color Doppler Indications:    Congestive Heart Failure I50.9  History:        Patient has prior history of Echocardiogram examinations, most                 recent 04/29/2019. Arrythmias:Atrial Fibrillation; Risk                 Factors:Hypertension, Diabetes and Dyslipidemia. 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  Sonographer:    Alvino Chapel RCS Referring Phys: [id number] ASIA B [place]  1.  Left ventricular ejection fraction, by estimation, is 50%. The left ventricle has low normal function. Left ventricular endocardial border not optimally defined to evaluate regional wall motion. Left ventricular diastolic parameters are indeterminate.  2. Right ventricular systolic function is moderately reduced. The right ventricular size is severely enlarged.  3. Left atrial  size was severely dilated.  4. Right atrial size was severely dilated.  5. The mitral valve is normal in structure. Mild mitral valve regurgitation. No evidence of mitral stenosis.  6. The aortic valve is tricuspid. Aortic valve regurgitation is not visualized. No aortic stenosis is present.  7. Moderate pulmonary HTN, PASP is 44 mmHg.  8. The inferior vena cava is dilated in size with <50% respiratory variability, suggesting right atrial pressure of 15 mmHg. FINDINGS  Left Ventricle: Left ventricular ejection fraction, by estimation, is 50%. The left ventricle has low normal function. Left ventricular endocardial border not optimally defined to evaluate regional wall motion. The left ventricular internal cavity size was normal in size. There is no left ventricular hypertrophy. Left ventricular diastolic parameters are indeterminate. Right Ventricle: The right ventricular size is severely enlarged. Right vetricular wall thickness was not assessed. Right ventricular systolic function is moderately reduced. Left Atrium: Left atrial size was severely dilated. Right Atrium: Right atrial size was severely dilated. Pericardium: There is no evidence of pericardial effusion. Mitral Valve: The mitral valve is normal in structure. Mild mitral valve regurgitation. No evidence of mitral valve stenosis. Tricuspid Valve: The tricuspid valve is normal in structure. Tricuspid valve regurgitation is mild . No evidence of tricuspid stenosis. Aortic Valve: The aortic valve is tricuspid. Aortic valve regurgitation is not visualized. No aortic stenosis is present. Aortic valve mean gradient measures 2.4 mmHg. Aortic valve peak gradient measures 5.1 mmHg. Aortic valve area, by VTI measures 2.01 cm. Pulmonic Valve: The pulmonic valve was not well visualized. Pulmonic valve regurgitation is mild. No evidence of pulmonic stenosis. Aorta: The aortic root is normal in size and structure. Pulmonary Artery: Moderate pulmonary HTN, PASP is 44  mmHg. Venous: The inferior vena cava is dilated in size with less than 50% respiratory variability, suggesting right atrial pressure of 15 mmHg. IAS/Shunts: No atrial level shunt detected by color flow Doppler.  LEFT VENTRICLE PLAX 2D LVIDd:         4.90 cm LVIDs:         3.60 cm LV PW:         1.00 cm LV IVS:        1.00 cm LVOT diam:     1.90 cm LV SV:         41 LV SV Index:   22 LVOT Area:     2.84 cm  RIGHT VENTRICLE RV S prime:     6.00 cm/s TAPSE (M-mode): 1.3 cm LEFT ATRIUM             Index       RIGHT ATRIUM           Index LA diam:        4.40 cm 2.29 cm/m  RA Area:     27.80 cm LA Vol (A2C):   69.8 ml 36.39 ml/m RA Volume:   102.00 ml 53.17 ml/m LA Vol (A4C):   77.5 ml 40.40 ml/m LA Biplane Vol: 74.3 ml 38.73 ml/m  AORTIC VALVE AV Area (Vmax):    1.80 cm AV Area (Vmean):   1.81 cm AV Area (VTI):     2.01 cm AV Vmax:  112.92 cm/s AV Vmean:          72.365 cm/s AV VTI:            0.206 m AV Peak Grad:      5.1 mmHg AV Mean Grad:      2.4 mmHg LVOT Vmax:         71.70 cm/s LVOT Vmean:        46.300 cm/s LVOT VTI:          0.146 m LVOT/AV VTI ratio: 0.71  AORTA Ao Root diam: 3.30 cm MITRAL VALVE MV Area (PHT): 5.31 cm     SHUNTS MV Decel Time: 143 msec     Systemic VTI:  0.15 m MV E velocity: 123.00 cm/s  Systemic Diam: 1.90 cm Carlyle Dolly MD Electronically signed by Carlyle Dolly MD Signature Date/Time: 11/17/2020/5:32:35 PM    Final         Scheduled Meds:  apixaban  5 mg Oral BID   atorvastatin  20 mg Oral Daily   budesonide  0.5 mg Nebulization BID   Chlorhexidine Gluconate Cloth  6 each Topical Daily   insulin aspart  0-15 Units Subcutaneous TID WC   insulin aspart  0-5 Units Subcutaneous QHS   insulin glargine  8 Units Subcutaneous QHS   Living Better with Heart Failure Book   Does not apply Once   metolazone  5 mg Oral Daily   metoprolol succinate  37.5 mg Oral Daily   montelukast  10 mg Oral QHS   pantoprazole  40 mg Oral Daily   potassium chloride  40 mEq  Oral Once   [START ON 11/19/2020] predniSONE  40 mg Oral Q breakfast   primidone  50 mg Oral QHS   Continuous Infusions:  furosemide (LASIX) 200 mg in dextrose 5% 100 mL (2mg /mL) infusion 10 mg/hr (11/18/20 0048)     LOS: 4 days    Time spent: 35 minutes    Winry Egnew Darleen Crocker, DO [facility]  If 7PM-7AM, please contact night-coverage [email or web address] 11/18/2020, 11:08 AM

## 2020-11-18 NOTE — Progress Notes (Signed)
Pt is upset that she was told to not get OOB until a staff member could come into room. Pt is now yelling at this RN stating that she is "not going to be talked to like a child and treated like a dog" Pt asked for her "day doctor" to be called to find out what she needs to do to "get out of here and go to [facility]". This RN explained to pt that her Doctor was not here and that the night MD is who we call for emergencies. This RN explained to pt that she could leave AMA if she was unhappy. Pt then started crying stating that she "just can't get worked up like this" "I;m Bipolar" and I need someone who is going to take care of me because I'm a child of God and I don't want to die" Pt then called her husband back and stated that she would stay tonight but she would talk with doctor tomorrow and get transferred to [facility].  Pt stated that she just "has to get somewhere that is going to take care of her".  This RN reassured pt that she was being taken care of and every time she has rang the call bell, someone has come into her room.  Pt asked for a snack with her night medications, and was provided graham crackers and a diet ginger ale. Pt then asked for a pain pill, which this RN gave. Pt then called this RN's mobile phone and told this same story again. This RN listened. At end of call, pt asked for assistance to [facility] and a band-aid to be placed on her toe. When this RN arrived in pt's room to put her on [facility], she was back on phone with her SO telling him this story again,  Pt has agreed to stay at [facility] through the night at this time.

## 2020-11-19 DIAGNOSIS — N179 Acute kidney failure, unspecified: Secondary | ICD-10-CM

## 2020-11-19 DIAGNOSIS — I509 Heart failure, unspecified: Secondary | ICD-10-CM | POA: Diagnosis not present

## 2020-11-19 LAB — RENAL FUNCTION PANEL
Albumin: 3.5 g/dL (ref 3.5–5.0)
Anion gap: 12 (ref 5–15)
BUN: 93 mg/dL — ABNORMAL HIGH (ref 8–23)
CO2: 36 mmol/L — ABNORMAL HIGH (ref 22–32)
Calcium: 8.5 mg/dL — ABNORMAL LOW (ref 8.9–10.3)
Chloride: 92 mmol/L — ABNORMAL LOW (ref 98–111)
Creatinine, Ser: 1.81 mg/dL — ABNORMAL HIGH (ref 0.44–1.00)
GFR, Estimated: 30 mL/min — ABNORMAL LOW (ref >60)
Glucose, Bld: 161 mg/dL — ABNORMAL HIGH (ref 70–99)
Phosphorus: 3 mg/dL (ref 2.5–4.6)
Potassium: 3.2 mmol/L — ABNORMAL LOW (ref 3.5–5.1)
Sodium: 140 mmol/L (ref 135–145)

## 2020-11-19 LAB — GLUCOSE, CAPILLARY
Glucose-Capillary: 157 mg/dL — ABNORMAL HIGH (ref 70–99)
Glucose-Capillary: 155 mg/dL — ABNORMAL HIGH (ref 70–99)
Glucose-Capillary: 129 mg/dL — ABNORMAL HIGH (ref 70–99)

## 2020-11-19 LAB — MAGNESIUM: Magnesium: 1.6 mg/dL — ABNORMAL LOW (ref 1.7–2.4)

## 2020-11-19 MED ORDER — POTASSIUM CHLORIDE CRYS ER 20 MEQ PO TBCR
40.0000 meq | EXTENDED_RELEASE_TABLET | Freq: Once | ORAL | Status: AC
  Administered 2020-11-19: 40 meq via ORAL
  Filled 2020-11-19: qty 2

## 2020-11-19 MED ORDER — FLUCONAZOLE 150 MG PO TABS
150.0000 mg | ORAL_TABLET | Freq: Every day | ORAL | Status: DC
  Administered 2020-11-19: 150 mg via ORAL
  Filled 2020-11-19: qty 1

## 2020-11-19 MED ORDER — MODAFINIL 200 MG PO TABS
200.0000 mg | ORAL_TABLET | Freq: Every day | ORAL | Status: DC
  Administered 2020-11-19: 200 mg via ORAL
  Filled 2020-11-19: qty 1

## 2020-11-19 MED ORDER — ESCITALOPRAM OXALATE 10 MG PO TABS
10.0000 mg | ORAL_TABLET | Freq: Every day | ORAL | Status: DC
  Administered 2020-11-19: 10 mg via ORAL
  Filled 2020-11-19: qty 1

## 2020-11-19 MED ORDER — ALLOPURINOL 100 MG PO TABS
100.0000 mg | ORAL_TABLET | Freq: Every day | ORAL | Status: DC
  Administered 2020-11-19: 100 mg via ORAL
  Filled 2020-11-19: qty 1

## 2020-11-19 MED ORDER — COLCHICINE 0.6 MG PO TABS
0.3000 mg | ORAL_TABLET | Freq: Every day | ORAL | Status: DC
  Administered 2020-11-19: 0.3 mg via ORAL
  Filled 2020-11-19: qty 0.5

## 2020-11-19 MED ORDER — MIRTAZAPINE 15 MG PO TABS: 15.0000 mg | ORAL_TABLET | Freq: Every day | ORAL | Status: DC

## 2020-11-19 MED ORDER — COLCHICINE 0.6 MG PO TABS
0.6000 mg | ORAL_TABLET | Freq: Every day | ORAL | Status: DC
  Filled 2020-11-19: qty 1

## 2020-11-19 MED ORDER — MAGNESIUM SULFATE 2 GM/50ML IV SOLN
2.0000 g | Freq: Once | INTRAVENOUS | Status: AC
  Administered 2020-11-19: 2 g via INTRAVENOUS
  Filled 2020-11-19: qty 50

## 2020-11-19 NOTE — Discharge Summary (Signed)
Physician Discharge Summary  Denise Freeman AOZ:[id number] DOB: Apr 22, 1951 DOA: 11/13/2020  PCP: [name], NP  Admit date: 11/13/2020  Discharge date: 11/19/2020 AMA DISCHARGE  Admitted From:Home  Disposition: AMA DISCHARGE   Equipment/Devices: None  CODE STATUS: Full  Brief/Interim Summary:  Denise Freeman  is a 70 y.o. female, with history of CHF, COPD, diabetes mellitus type 2, GERD, CKD, and more presents the ED with a chief complaint of dyspnea.  She reports that her dyspnea started 2 or 3 weeks ago and has been progressively worse since then.  Patient was admitted with acute diastolic CHF exacerbation as well as COPD exacerbation along with AKI on CKD stage IIIa.  She was noted to have difficulty with diuresis and required high dose of diuretics for which nephrology consultation was obtained to assist with volume overload. She was having ongoing diuresis with Lasix, but had some disagreements with the nursing staff overnight and felt badly about the way she was treated. She feared staying another night at the hospital and therefore, decided to leave the hospital. She had full decision-making capacity when she left AMA.  Discharge Diagnoses:  Active Problems:   Chronic hypoxic, hypercapnic respiratory failure   COPD with asthma (HCC)   Acute renal failure (HCC)   DM type 2 causing vascular disease ([place])   Acute decompensated heart failure (HCC)   Acute on chronic diastolic (congestive) heart failure (HCC)  Principle Discharge Diagnosis: Acute on chronic diastolic CHF exacerbation along with AKI on CKD III.  Discharge Instructions     Follow-up Information     [facility]   Specialty: Home Health Services Why: PT will call with 48 hours of discharge to set up the first home visit.               Allergies  Allergen Reactions   Oseltamivir Hives    Other reaction(s): Other (see comments) "water blisters"   Xarelto [Rivaroxaban]  Other (See Comments)    Internal bleeding   Ancef [Cefazolin] Nausea And Vomiting   Levaquin [Levofloxacin In D5w] Other (See Comments)    "afib"   Lyrica [Pregabalin] Hives   Tamiflu [Oseltamivir Phosphate] Other (See Comments)    "water blisters"   Zoloft [Sertraline Hcl] Other (See Comments)    Jaw problems, jittery   Torsemide Other (See Comments)    Not effective   Augmentin [Amoxicillin-Pot Clavulanate] Itching   Ciprofloxacin Itching and Nausea And Vomiting   Haldol [Haloperidol] Other (See Comments)    Restless leg   Nsaids Diarrhea   Penicillins Itching, Nausea And Vomiting and Rash    Has patient had a PCN reaction causing immediate rash, facial/tongue/throat swelling, SOB or lightheadedness with hypotension: Yes Has patient had a PCN reaction causing severe rash involving mucus membranes or skin necrosis: No Has patient had a PCN reaction that required hospitalization No Has patient had a PCN reaction occurring within the last 10 years: Yes If all of the above answers are "NO", then may proceed with Cephalosporin use.   Topamax [Topiramate] Nausea Only    Consultations: Nephrology   Procedures/Studies: DG Chest Portable 1 View  Result Date: 11/13/2020 CLINICAL DATA:  70 year old female with shortness of breath. EXAM: PORTABLE CHEST 1 VIEW COMPARISON:  Chest radiograph dated 05/19/2020. FINDINGS: Small right pleural effusion and right lung base atelectasis or infiltrate. Trace left pleural effusion may be present. There is no pneumothorax. There is mild cardiomegaly. No acute osseous pathology. Degenerative changes of the spine. Cervical ACDF.  IMPRESSION: Small right pleural effusion and right lung base atelectasis or infiltrate. Electronically Signed   By: Anner Crete M.D.   On: 11/13/2020 19:27   ECHOCARDIOGRAM COMPLETE  Result Date: 11/17/2020    ECHOCARDIOGRAM REPORT   Patient Name:   Denise Freeman Date of Exam: 11/17/2020 Medical Rec #:  [id number]     Height:        59.0 in Accession #:    [id number]    Weight:       219.4 lb Date of Birth:  1950/08/19    BSA:          1.918 m Patient Age:    40 years      BP:           99/67 mmHg Patient Gender: F             HR:           82 bpm. Exam Location:  [facility] Procedure: 2D Echo, Cardiac Doppler and Color Doppler Indications:    Congestive Heart Failure I50.9  History:        Patient has prior history of Echocardiogram examinations, most                 recent 04/29/2019. Arrythmias:Atrial Fibrillation; Risk                 Factors:Hypertension, Diabetes and Dyslipidemia. 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  Sonographer:    Alvino Chapel RCS Referring Phys: [id number] ASIA B [place]  1. Left ventricular ejection fraction, by estimation, is 50%. The left ventricle has low normal function. Left ventricular endocardial border not optimally defined to evaluate regional wall motion. Left ventricular diastolic parameters are indeterminate.  2. Right ventricular systolic function is moderately reduced. The right ventricular size is severely enlarged.  3. Left atrial size was severely dilated.  4. Right atrial size was severely dilated.  5. The mitral valve is normal in structure. Mild mitral valve regurgitation. No evidence of mitral stenosis.  6. The aortic valve is tricuspid. Aortic valve regurgitation is not visualized. No aortic stenosis is present.  7. Moderate pulmonary HTN, PASP is 44 mmHg.  8. The inferior vena cava is dilated in size with <50% respiratory variability, suggesting right atrial pressure of 15 mmHg. FINDINGS  Left Ventricle: Left ventricular ejection fraction, by estimation, is 50%. The left ventricle has low normal function. Left ventricular endocardial border not optimally defined to evaluate regional  wall motion. The left ventricular internal cavity size was normal in size. There is no left ventricular hypertrophy. Left ventricular diastolic parameters are indeterminate. Right Ventricle: The right ventricular size is severely enlarged. Right vetricular wall thickness was not assessed. Right ventricular systolic function is moderately reduced. Left Atrium: Left atrial size was severely dilated. Right Atrium: Right atrial size was severely dilated. Pericardium: There is no evidence of pericardial effusion. Mitral Valve: The mitral valve is normal in structure. Mild mitral valve regurgitation. No evidence of mitral valve stenosis. Tricuspid Valve: The tricuspid valve is normal in structure. Tricuspid valve regurgitation is mild . No evidence of tricuspid stenosis.  Aortic Valve: The aortic valve is tricuspid. Aortic valve regurgitation is not visualized. No aortic stenosis is present. Aortic valve mean gradient measures 2.4 mmHg. Aortic valve peak gradient measures 5.1 mmHg. Aortic valve area, by VTI measures 2.01 cm. Pulmonic Valve: The pulmonic valve was not well visualized. Pulmonic valve regurgitation is mild. No evidence of pulmonic stenosis. Aorta: The aortic root is normal in size and structure. Pulmonary Artery: Moderate pulmonary HTN, PASP is 44 mmHg. Venous: The inferior vena cava is dilated in size with less than 50% respiratory variability, suggesting right atrial pressure of 15 mmHg. IAS/Shunts: No atrial level shunt detected by color flow Doppler.  LEFT VENTRICLE PLAX 2D LVIDd:         4.90 cm LVIDs:         3.60 cm LV PW:         1.00 cm LV IVS:        1.00 cm LVOT diam:     1.90 cm LV SV:         41 LV SV Index:   22 LVOT Area:     2.84 cm  RIGHT VENTRICLE RV S prime:     6.00 cm/s TAPSE (M-mode): 1.3 cm LEFT ATRIUM             Index       RIGHT ATRIUM           Index LA diam:        4.40 cm 2.29 cm/m  RA Area:     27.80 cm LA Vol (A2C):   69.8 ml 36.39 ml/m RA Volume:   102.00 ml 53.17 ml/m LA  Vol (A4C):   77.5 ml 40.40 ml/m LA Biplane Vol: 74.3 ml 38.73 ml/m  AORTIC VALVE AV Area (Vmax):    1.80 cm AV Area (Vmean):   1.81 cm AV Area (VTI):     2.01 cm AV Vmax:           112.92 cm/s AV Vmean:          72.365 cm/s AV VTI:            0.206 m AV Peak Grad:      5.1 mmHg AV Mean Grad:      2.4 mmHg LVOT Vmax:         71.70 cm/s LVOT Vmean:        46.300 cm/s LVOT VTI:          0.146 m LVOT/AV VTI ratio: 0.71  AORTA Ao Root diam: 3.30 cm MITRAL VALVE MV Area (PHT): 5.31 cm     SHUNTS MV Decel Time: 143 msec     Systemic VTI:  0.15 m MV E velocity: 123.00 cm/s  Systemic Diam: 1.90 cm Carlyle Dolly MD Electronically signed by Carlyle Dolly MD Signature Date/Time: 11/17/2020/5:32:35 PM    Final      Discharge Exam: Vitals:   11/19/20 0735 11/19/20 0845  BP:  (!) 130/95  Pulse:  90  Resp:  18  Temp:  97.8 F (36.6 C)  SpO2: 97% 100%   Vitals:   11/18/20 2111 11/19/20 0600 11/19/20 0735 11/19/20 0845  BP: 111/66   (!) 130/95  Pulse: 89   90  Resp: 17   18  Temp: 98 F (36.7 C)   97.8 F (36.6 C)  TempSrc: Oral   Oral  SpO2: 97%  97% 100%  Weight:  97.6 kg    Height:        General: Pt  is alert, awake, not in acute distress, obese Cardiovascular: RRR, S1/S2 +, no rubs, no gallops Respiratory: CTA bilaterally, no wheezing, no rhonchi, on [place] Abdominal: Soft, NT, ND, bowel sounds + Extremities: persistent edema, no cyanosis    The results of significant diagnostics from this hospitalization (including imaging, microbiology, ancillary and laboratory) are listed below for reference.     Microbiology: Recent Results (from the past 240 hour(s))  Resp Panel by RT-PCR (Flu A&B, Covid) Nasopharyngeal Swab     Status: None   Collection Time: 11/13/20  9:53 PM   Specimen: Nasopharyngeal Swab; Nasopharyngeal(NP) swabs in vial transport medium  Result Value Ref Range Status   SARS Coronavirus 2 by RT PCR NEGATIVE NEGATIVE Final    Comment: (NOTE) SARS-CoV-2 target nucleic  acids are NOT DETECTED.  The SARS-CoV-2 RNA is generally detectable in upper respiratory specimens during the acute phase of infection. The lowest concentration of SARS-CoV-2 viral copies this assay can detect is 138 copies/mL. A negative result does not preclude SARS-Cov-2 infection and should not be used as the sole basis for treatment or other patient management decisions. A negative result may occur with  improper specimen collection/handling, submission of specimen other than nasopharyngeal swab, presence of viral mutation(s) within the areas targeted by this assay, and inadequate number of viral copies(<138 copies/mL). A negative result must be combined with clinical observations, patient history, and epidemiological information. The expected result is Negative.  Fact Sheet for Patients:  EntrepreneurPulse.com.au  Fact Sheet for Healthcare Providers:  IncredibleEmployment.be  This test is no t yet approved or cleared by the [place] FDA and  has been authorized for detection and/or diagnosis of SARS-CoV-2 by FDA under an Emergency Use Authorization (EUA). This EUA will remain  in effect (meaning this test can be used) for the duration of the COVID-19 declaration under Section 564(b)(1) of the Act, 21 U.S.C.section 360bbb-3(b)(1), unless the authorization is terminated  or revoked sooner.       Influenza A by PCR NEGATIVE NEGATIVE Final   Influenza B by PCR NEGATIVE NEGATIVE Final    Comment: (NOTE) The Xpert Xpress SARS-CoV-2/FLU/RSV plus assay is intended as an aid in the diagnosis of influenza from Nasopharyngeal swab specimens and should not be used as a sole basis for treatment. Nasal washings and aspirates are unacceptable for Xpert Xpress SARS-CoV-2/FLU/RSV testing.  Fact Sheet for Patients: EntrepreneurPulse.com.au  Fact Sheet for Healthcare Providers: IncredibleEmployment.be  This  test is not yet approved or cleared by the [place] FDA and has been authorized for detection and/or diagnosis of SARS-CoV-2 by FDA under an Emergency Use Authorization (EUA). This EUA will remain in effect (meaning this test can be used) for the duration of the COVID-19 declaration under Section 564(b)(1) of the Act, 21 U.S.C. section 360bbb-3(b)(1), unless the authorization is terminated or revoked.  Performed at [facility], [address] [place], [place] [address]      Labs: BNP (last 3 results) Recent Labs    03/23/20 0730 06/08/20 1148 11/13/20 1952  BNP 925.0* 177* 9,476.5*   Basic Metabolic Panel: Recent Labs  Lab 11/15/20 0548 11/16/20 0521 11/17/20 0621 11/18/20 0645 11/19/20 0708  NA 137 139 138 139 140  K 4.6 4.4 3.7 3.5 3.2*  CL 95* 94* 93* 92* 92*  CO2 31 33* 33* 36* 36*  GLUCOSE 184* 196* 201* 198* 161*  BUN 76* 84* 85* 88* 93*  CREATININE 2.52* 2.59* 2.25* 2.05* 1.81*  CALCIUM 8.3* 8.3* 8.1* 8.4* 8.5*  MG 1.8 1.9  1.8 1.8 1.6*  PHOS 4.3 4.0 3.7 3.4 3.0   Liver Function Tests: Recent Labs  Lab 11/13/20 1952 11/14/20 0538 11/15/20 0548 11/16/20 0521 11/17/20 0621 11/18/20 0645 11/19/20 0708  AST 30 27  --   --   --   --   --   ALT 13 14  --   --   --   --   --   ALKPHOS 171* 172*  --   --   --   --   --   BILITOT 0.6 1.0  --   --   --   --   --   PROT 7.2 6.7  --   --   --   --   --   ALBUMIN 3.5 3.3* 3.3* 3.4* 3.0* 3.3* 3.5   No results for input(s): LIPASE, AMYLASE in the last 168 hours. No results for input(s): AMMONIA in the last 168 hours. CBC: Recent Labs  Lab 11/13/20 1952 11/14/20 0538 11/15/20 0548 11/16/20 0521  WBC 9.5 6.9 6.2 7.1  NEUTROABS 7.1  --   --   --   HGB 12.7 12.4 11.3* 11.9*  HCT 42.4 41.8 37.3 39.2  MCV 99.8 100.0 97.6 96.6  PLT 186 171 182 198   Cardiac Enzymes: No results for input(s): CKTOTAL, CKMB, CKMBINDEX, TROPONINI in the last 168 hours. BNP: Invalid input(s): POCBNP CBG: Recent Labs   Lab 11/18/20 1221 11/18/20 1701 11/18/20 2113 11/19/20 0752 11/19/20 1213  GLUCAP 188* 173* 157* 155* 129*   D-Dimer No results for input(s): DDIMER in the last 72 hours. Hgb A1c No results for input(s): HGBA1C in the last 72 hours. Lipid Profile No results for input(s): CHOL, HDL, LDLCALC, TRIG, CHOLHDL, LDLDIRECT in the last 72 hours. Thyroid function studies No results for input(s): TSH, T4TOTAL, T3FREE, THYROIDAB in the last 72 hours.  Invalid input(s): FREET3 Anemia work up No results for input(s): VITAMINB12, FOLATE, FERRITIN, TIBC, IRON, RETICCTPCT in the last 72 hours. Urinalysis    Component Value Date/Time   COLORURINE YELLOW 11/14/2020 1258   APPEARANCEUR HAZY (A) 11/14/2020 1258   LABSPEC 1.012 11/14/2020 1258   PHURINE 5.0 11/14/2020 1258   GLUCOSEU NEGATIVE 11/14/2020 1258   HGBUR NEGATIVE 11/14/2020 1258   [place] 11/14/2020 1258   [place] 11/14/2020 1258   PROTEINUR NEGATIVE 11/14/2020 1258   UROBILINOGEN 1.0 02/20/2015 1855   NITRITE NEGATIVE 11/14/2020 1258   LEUKOCYTESUR MODERATE (A) 11/14/2020 1258   Sepsis Labs Invalid input(s): PROCALCITONIN,  WBC,  LACTICIDVEN Microbiology Recent Results (from the past 240 hour(s))  Resp Panel by RT-PCR (Flu A&B, Covid) Nasopharyngeal Swab     Status: None   Collection Time: 11/13/20  9:53 PM   Specimen: Nasopharyngeal Swab; Nasopharyngeal(NP) swabs in vial transport medium  Result Value Ref Range Status   SARS Coronavirus 2 by RT PCR NEGATIVE NEGATIVE Final    Comment: (NOTE) SARS-CoV-2 target nucleic acids are NOT DETECTED.  The SARS-CoV-2 RNA is generally detectable in upper respiratory specimens during the acute phase of infection. The lowest concentration of SARS-CoV-2 viral copies this assay can detect is 138 copies/mL. A negative result does not preclude SARS-Cov-2 infection and should not be used as the sole basis for treatment or other patient management decisions. A  negative result may occur with  improper specimen collection/handling, submission of specimen other than nasopharyngeal swab, presence of viral mutation(s) within the areas targeted by this assay, and inadequate number of viral copies(<138 copies/mL). A negative result must be combined  with clinical observations, patient history, and epidemiological information. The expected result is Negative.  Fact Sheet for Patients:  EntrepreneurPulse.com.au  Fact Sheet for Healthcare Providers:  IncredibleEmployment.be  This test is no t yet approved or cleared by the [place] FDA and  has been authorized for detection and/or diagnosis of SARS-CoV-2 by FDA under an Emergency Use Authorization (EUA). This EUA will remain  in effect (meaning this test can be used) for the duration of the COVID-19 declaration under Section 564(b)(1) of the Act, 21 U.S.C.section 360bbb-3(b)(1), unless the authorization is terminated  or revoked sooner.       Influenza A by PCR NEGATIVE NEGATIVE Final   Influenza B by PCR NEGATIVE NEGATIVE Final    Comment: (NOTE) The Xpert Xpress SARS-CoV-2/FLU/RSV plus assay is intended as an aid in the diagnosis of influenza from Nasopharyngeal swab specimens and should not be used as a sole basis for treatment. Nasal washings and aspirates are unacceptable for Xpert Xpress SARS-CoV-2/FLU/RSV testing.  Fact Sheet for Patients: EntrepreneurPulse.com.au  Fact Sheet for Healthcare Providers: IncredibleEmployment.be  This test is not yet approved or cleared by the [place] FDA and has been authorized for detection and/or diagnosis of SARS-CoV-2 by FDA under an Emergency Use Authorization (EUA). This EUA will remain in effect (meaning this test can be used) for the duration of the COVID-19 declaration under Section 564(b)(1) of the Act, 21 U.S.C. section 360bbb-3(b)(1), unless the authorization  is terminated or revoked.  Performed at [facility], [address] [place], [place] [address]      Time coordinating discharge: 35 minutes  SIGNED:   Rodena Goldmann, DO [facility] 11/19/2020, 3:01 PM  If 7PM-7AM, please contact night-coverage [email or web address]

## 2020-11-19 NOTE — Progress Notes (Signed)
Patient called front desk and states when nurse answered the phone (not call light) that she "wanted to be transferred to [facility], CNA and she didn't want any mouth or lip about it"  I attempted to transfer her call and she hung up.  I went into the room to see what she needed at which time she started yelling at me and screaming that I had been rude to her every time I have answered the phone which I had only answered one time.  Every Rn on the floor has tried to calm this lady down and she has been rude and hostile to every one that has went in to her room. Patient stated " I'm leaving and you need to get out of my room ".  Multiple staff tried to explain that we can not remove her IV and foley cath out until someone arrives to pick her up.  She called the Sheriff's office to go wake her husband up so he can come pick her up. Sheriff's office called asking for someone to check on this patient because she stated to them that she was in distress and no one was checking on her.  Several nurses had checked on her multiple time.  Md notified that patient has signed the [facility] papers.  Also notified Ac, who came to speak to the patient.

## 2020-11-19 NOTE — Progress Notes (Signed)
PROGRESS NOTE    Denise Freeman  GMW:[id number] DOB: 1950/09/29 DOA: 11/13/2020 PCP: [name], NP   Brief Narrative:   Denise Freeman  is a 70 y.o. female, with history of CHF, COPD, diabetes mellitus type 2, GERD, CKD, and more presents the ED with a chief complaint of dyspnea.  She reports that her dyspnea started 2 or 3 weeks ago and has been progressively worse since then.  Patient was admitted with acute diastolic CHF exacerbation as well as COPD exacerbation along with AKI on CKD stage IIIa.  She was noted to have difficulty with diuresis and requires high dose of diuretics for which nephrology consultation was obtained to assist with volume overload.  Assessment & Plan:   Active Problems:   Chronic hypoxic, hypercapnic respiratory failure   COPD with asthma (HCC)   Acute renal failure (HCC)   DM type 2 causing vascular disease ([place])   Acute decompensated heart failure (HCC)   Acute on chronic diastolic (congestive) heart failure (HCC)   Acute on chronic diastolic CHF exacerbation -Likely cardiorenal syndrome -Increased IV Lasix to drip on 6/16 -Monitor strict I's and O's -2D echocardiogram with LVEF 50% and no other acute findings -Foley present with over 2 L fluid output in the last 24 hours   AKI on CKD stage IIIa-stable -In the setting of decompensated diastolic CHF consistent with cardiorenal syndrome -Appreciate nephrology assistance -Follow am labs  Hypokalemia/hypomagnesemia -Replete and reevaluate in a.m.   Acute COPD exacerbation-resolved -Nebulizers to as needed and steroids weaned   Atrial fibrillation -Metoprolol and Eliquis   Type 2 diabetes -Continue SSI   GERD -Continue PPI     DVT prophylaxis:Eliquis Code Status: Full Family Communication: Husband at bedside 6/19 Disposition Plan:  Status is: Inpatient   Remains inpatient appropriate because:IV treatments appropriate due to intensity of illness or inability to take PO   Dispo: The  patient is from: Home              Anticipated d/c is to: Home with home health              Patient currently is not medically stable to d/c.              Difficult to place patient No     Consultants:  Nephrology   Procedures:  See below   Antimicrobials:  None  Subjective: Patient seen and evaluated today with multiple complaints regarding overnight events involving staff.  She would also like to have some of her home medications resumed.  Objective: Vitals:   11/18/20 2111 11/19/20 0600 11/19/20 0735 11/19/20 0845  BP: 111/66   (!) 130/95  Pulse: 89   90  Resp: 17   18  Temp: 98 F (36.7 C)   97.8 F (36.6 C)  TempSrc: Oral   Oral  SpO2: 97%  97% 100%  Weight:  97.6 kg    Height:        Intake/Output Summary (Last 24 hours) at 11/19/2020 1332 Last data filed at 11/19/2020 1230 Gross per 24 hour  Intake 1212.65 ml  Output 5350 ml  Net -4137.35 ml   Filed Weights   11/17/20 0500 11/18/20 0542 11/19/20 0600  Weight: 99.5 kg 100.5 kg 97.6 kg    Examination:  General exam: Appears calm and comfortable, elderly/obese Respiratory system: Clear to auscultation. Respiratory effort normal.  On nasal cannula oxygen Cardiovascular system: S1 & S2 heard, RRR.  Gastrointestinal system: Abdomen is soft Central nervous system: Alert and  awake Extremities: No edema Skin: No significant lesions noted Psychiatry: Flat affect. Foley with clear, yellow urine output    Data Reviewed: I have personally reviewed following labs and imaging studies  CBC: Recent Labs  Lab 11/13/20 1952 11/14/20 0538 11/15/20 0548 11/16/20 0521  WBC 9.5 6.9 6.2 7.1  NEUTROABS 7.1  --   --   --   HGB 12.7 12.4 11.3* 11.9*  HCT 42.4 41.8 37.3 39.2  MCV 99.8 100.0 97.6 96.6  PLT 186 171 182 932   Basic Metabolic Panel: Recent Labs  Lab 11/15/20 0548 11/16/20 0521 11/17/20 0621 11/18/20 0645 11/19/20 0708  NA 137 139 138 139 140  K 4.6 4.4 3.7 3.5 3.2*  CL 95* 94* 93* 92* 92*   CO2 31 33* 33* 36* 36*  GLUCOSE 184* 196* 201* 198* 161*  BUN 76* 84* 85* 88* 93*  CREATININE 2.52* 2.59* 2.25* 2.05* 1.81*  CALCIUM 8.3* 8.3* 8.1* 8.4* 8.5*  MG 1.8 1.9 1.8 1.8 1.6*  PHOS 4.3 4.0 3.7 3.4 3.0   GFR: Estimated Creatinine Clearance: 30.1 mL/min (A) (by C-G formula based on SCr of 1.81 mg/dL (H)). Liver Function Tests: Recent Labs  Lab 11/13/20 1952 11/14/20 0538 11/15/20 0548 11/16/20 0521 11/17/20 0621 11/18/20 0645 11/19/20 0708  AST 30 27  --   --   --   --   --   ALT 13 14  --   --   --   --   --   ALKPHOS 171* 172*  --   --   --   --   --   BILITOT 0.6 1.0  --   --   --   --   --   PROT 7.2 6.7  --   --   --   --   --   ALBUMIN 3.5 3.3* 3.3* 3.4* 3.0* 3.3* 3.5   No results for input(s): LIPASE, AMYLASE in the last 168 hours. No results for input(s): AMMONIA in the last 168 hours. Coagulation Profile: No results for input(s): INR, PROTIME in the last 168 hours. Cardiac Enzymes: No results for input(s): CKTOTAL, CKMB, CKMBINDEX, TROPONINI in the last 168 hours. BNP (last 3 results) No results for input(s): PROBNP in the last 8760 hours. HbA1C: No results for input(s): HGBA1C in the last 72 hours. CBG: Recent Labs  Lab 11/18/20 1221 11/18/20 1701 11/18/20 2113 11/19/20 0752 11/19/20 1213  GLUCAP 188* 173* 157* 155* 129*   Lipid Profile: No results for input(s): CHOL, HDL, LDLCALC, TRIG, CHOLHDL, LDLDIRECT in the last 72 hours. Thyroid Function Tests: No results for input(s): TSH, T4TOTAL, FREET4, T3FREE, THYROIDAB in the last 72 hours. Anemia Panel: No results for input(s): VITAMINB12, FOLATE, FERRITIN, TIBC, IRON, RETICCTPCT in the last 72 hours. Sepsis Labs: No results for input(s): PROCALCITON, LATICACIDVEN in the last 168 hours.  Recent Results (from the past 240 hour(s))  Resp Panel by RT-PCR (Flu A&B, Covid) Nasopharyngeal Swab     Status: None   Collection Time: 11/13/20  9:53 PM   Specimen: Nasopharyngeal Swab; Nasopharyngeal(NP)  swabs in vial transport medium  Result Value Ref Range Status   SARS Coronavirus 2 by RT PCR NEGATIVE NEGATIVE Final    Comment: (NOTE) SARS-CoV-2 target nucleic acids are NOT DETECTED.  The SARS-CoV-2 RNA is generally detectable in upper respiratory specimens during the acute phase of infection. The lowest concentration of SARS-CoV-2 viral copies this assay can detect is 138 copies/mL. A negative result does not preclude SARS-Cov-2 infection and should  not be used as the sole basis for treatment or other patient management decisions. A negative result may occur with  improper specimen collection/handling, submission of specimen other than nasopharyngeal swab, presence of viral mutation(s) within the areas targeted by this assay, and inadequate number of viral copies(<138 copies/mL). A negative result must be combined with clinical observations, patient history, and epidemiological information. The expected result is Negative.  Fact Sheet for Patients:  EntrepreneurPulse.com.au  Fact Sheet for Healthcare Providers:  IncredibleEmployment.be  This test is no t yet approved or cleared by the [place] FDA and  has been authorized for detection and/or diagnosis of SARS-CoV-2 by FDA under an Emergency Use Authorization (EUA). This EUA will remain  in effect (meaning this test can be used) for the duration of the COVID-19 declaration under Section 564(b)(1) of the Act, 21 U.S.C.section 360bbb-3(b)(1), unless the authorization is terminated  or revoked sooner.       Influenza A by PCR NEGATIVE NEGATIVE Final   Influenza B by PCR NEGATIVE NEGATIVE Final    Comment: (NOTE) The Xpert Xpress SARS-CoV-2/FLU/RSV plus assay is intended as an aid in the diagnosis of influenza from Nasopharyngeal swab specimens and should not be used as a sole basis for treatment. Nasal washings and aspirates are unacceptable for Xpert Xpress  SARS-CoV-2/FLU/RSV testing.  Fact Sheet for Patients: EntrepreneurPulse.com.au  Fact Sheet for Healthcare Providers: IncredibleEmployment.be  This test is not yet approved or cleared by the [place] FDA and has been authorized for detection and/or diagnosis of SARS-CoV-2 by FDA under an Emergency Use Authorization (EUA). This EUA will remain in effect (meaning this test can be used) for the duration of the COVID-19 declaration under Section 564(b)(1) of the Act, 21 U.S.C. section 360bbb-3(b)(1), unless the authorization is terminated or revoked.  Performed at [facility], [address] [place], [place] [address]          Radiology Studies: ECHOCARDIOGRAM COMPLETE  Result Date: 11/17/2020    ECHOCARDIOGRAM REPORT   Patient Name:   Denise Freeman Date of Exam: 11/17/2020 Medical Rec #:  [id number]     Height:       59.0 in Accession #:    [id number]    Weight:       219.4 lb Date of Birth:  1951/01/22    BSA:          1.918 m Patient Age:    65 years      BP:           99/67 mmHg Patient Gender: F             HR:           82 bpm. Exam Location:  [facility] Procedure: 2D Echo, Cardiac Doppler and Color Doppler Indications:    Congestive Heart Failure I50.9  History:        Patient has prior history of Echocardiogram examinations, most                 recent 04/29/2019. Arrythmias:Atrial Fibrillation; Risk                 Factors:Hypertension, Diabetes and Dyslipidemia. Acute on                 chronic combined systolic and diastolic CHF congestive heart                 failure), Prolonged QT interval, NICM (nonischemic  cardiomyopathy), Morbid Obesity, OSA (obstructive sleep apnea).  Sonographer:    Alvino Chapel RCS Referring Phys: [id number] ASIA B Fox  1. Left ventricular ejection fraction, by estimation, is 50%. The left ventricle has low normal function. Left ventricular endocardial border not optimally defined to  evaluate regional wall motion. Left ventricular diastolic parameters are indeterminate.  2. Right ventricular systolic function is moderately reduced. The right ventricular size is severely enlarged.  3. Left atrial size was severely dilated.  4. Right atrial size was severely dilated.  5. The mitral valve is normal in structure. Mild mitral valve regurgitation. No evidence of mitral stenosis.  6. The aortic valve is tricuspid. Aortic valve regurgitation is not visualized. No aortic stenosis is present.  7. Moderate pulmonary HTN, PASP is 44 mmHg.  8. The inferior vena cava is dilated in size with <50% respiratory variability, suggesting right atrial pressure of 15 mmHg. FINDINGS  Left Ventricle: Left ventricular ejection fraction, by estimation, is 50%. The left ventricle has low normal function. Left ventricular endocardial border not optimally defined to evaluate regional wall motion. The left ventricular internal cavity size was normal in size. There is no left ventricular hypertrophy. Left ventricular diastolic parameters are indeterminate. Right Ventricle: The right ventricular size is severely enlarged. Right vetricular wall thickness was not assessed. Right ventricular systolic function is moderately reduced. Left Atrium: Left atrial size was severely dilated. Right Atrium: Right atrial size was severely dilated. Pericardium: There is no evidence of pericardial effusion. Mitral Valve: The mitral valve is normal in structure. Mild mitral valve regurgitation. No evidence of mitral valve stenosis. Tricuspid Valve: The tricuspid valve is normal in structure. Tricuspid valve regurgitation is mild . No evidence of tricuspid stenosis. Aortic Valve: The aortic valve is tricuspid. Aortic valve regurgitation is not visualized. No aortic stenosis is present. Aortic valve mean gradient measures 2.4 mmHg. Aortic valve peak gradient measures 5.1 mmHg. Aortic valve area, by VTI measures 2.01 cm. Pulmonic Valve: The  pulmonic valve was not well visualized. Pulmonic valve regurgitation is mild. No evidence of pulmonic stenosis. Aorta: The aortic root is normal in size and structure. Pulmonary Artery: Moderate pulmonary HTN, PASP is 44 mmHg. Venous: The inferior vena cava is dilated in size with less than 50% respiratory variability, suggesting right atrial pressure of 15 mmHg. IAS/Shunts: No atrial level shunt detected by color flow Doppler.  LEFT VENTRICLE PLAX 2D LVIDd:         4.90 cm LVIDs:         3.60 cm LV PW:         1.00 cm LV IVS:        1.00 cm LVOT diam:     1.90 cm LV SV:         41 LV SV Index:   22 LVOT Area:     2.84 cm  RIGHT VENTRICLE RV S prime:     6.00 cm/s TAPSE (M-mode): 1.3 cm LEFT ATRIUM             Index       RIGHT ATRIUM           Index LA diam:        4.40 cm 2.29 cm/m  RA Area:     27.80 cm LA Vol (A2C):   69.8 ml 36.39 ml/m RA Volume:   102.00 ml 53.17 ml/m LA Vol (A4C):   77.5 ml 40.40 ml/m LA Biplane Vol: 74.3 ml 38.73 ml/m  AORTIC VALVE AV Area (Vmax):  1.80 cm AV Area (Vmean):   1.81 cm AV Area (VTI):     2.01 cm AV Vmax:           112.92 cm/s AV Vmean:          72.365 cm/s AV VTI:            0.206 m AV Peak Grad:      5.1 mmHg AV Mean Grad:      2.4 mmHg LVOT Vmax:         71.70 cm/s LVOT Vmean:        46.300 cm/s LVOT VTI:          0.146 m LVOT/AV VTI ratio: 0.71  AORTA Ao Root diam: 3.30 cm MITRAL VALVE MV Area (PHT): 5.31 cm     SHUNTS MV Decel Time: 143 msec     Systemic VTI:  0.15 m MV E velocity: 123.00 cm/s  Systemic Diam: 1.90 cm Carlyle Dolly MD Electronically signed by Carlyle Dolly MD Signature Date/Time: 11/17/2020/5:32:35 PM    Final         Scheduled Meds:  allopurinol  100 mg Oral Daily   apixaban  5 mg Oral BID   atorvastatin  20 mg Oral Daily   budesonide  0.5 mg Nebulization BID   Chlorhexidine Gluconate Cloth  6 each Topical Daily   [START ON 11/20/2020] colchicine  0.3 mg Oral Daily   escitalopram  10 mg Oral Daily   fluconazole  150 mg Oral  Daily   insulin aspart  0-15 Units Subcutaneous TID WC   insulin aspart  0-5 Units Subcutaneous QHS   insulin glargine  8 Units Subcutaneous QHS   Living Better with Heart Failure Book   Does not apply Once   metolazone  5 mg Oral Daily   metoprolol succinate  37.5 mg Oral Daily   mirtazapine  15 mg Oral QHS   modafinil  200 mg Oral Daily   montelukast  10 mg Oral QHS   pantoprazole  40 mg Oral Daily   predniSONE  40 mg Oral Q breakfast   primidone  50 mg Oral QHS   Continuous Infusions:  furosemide (LASIX) 200 mg in dextrose 5% 100 mL (2mg /mL) infusion 10 mg/hr (11/19/20 1259)   magnesium sulfate bolus IVPB 2 g (11/19/20 1323)     LOS: 5 days    Time spent: 35 minutes    Ariya Bohannon Darleen Crocker, DO [facility]  If 7PM-7AM, please contact night-coverage [email or web address] 11/19/2020, 1:32 PM

## 2020-11-20 ENCOUNTER — Other Ambulatory Visit: Payer: Self-pay | Admitting: *Deleted

## 2020-11-20 ENCOUNTER — Telehealth: Payer: Self-pay | Admitting: Cardiology

## 2020-11-20 DIAGNOSIS — M1030 Gout due to renal impairment, unspecified site: Secondary | ICD-10-CM | POA: Diagnosis not present

## 2020-11-20 DIAGNOSIS — G4733 Obstructive sleep apnea (adult) (pediatric): Secondary | ICD-10-CM | POA: Diagnosis not present

## 2020-11-20 DIAGNOSIS — Z79899 Other long term (current) drug therapy: Secondary | ICD-10-CM

## 2020-11-20 DIAGNOSIS — I429 Cardiomyopathy, unspecified: Secondary | ICD-10-CM | POA: Diagnosis not present

## 2020-11-20 DIAGNOSIS — I5033 Acute on chronic diastolic (congestive) heart failure: Secondary | ICD-10-CM | POA: Diagnosis not present

## 2020-11-20 DIAGNOSIS — E1140 Type 2 diabetes mellitus with diabetic neuropathy, unspecified: Secondary | ICD-10-CM | POA: Diagnosis not present

## 2020-11-20 DIAGNOSIS — E875 Hyperkalemia: Secondary | ICD-10-CM | POA: Diagnosis not present

## 2020-11-20 DIAGNOSIS — I130 Hypertensive heart and chronic kidney disease with heart failure and stage 1 through stage 4 chronic kidney disease, or unspecified chronic kidney disease: Secondary | ICD-10-CM | POA: Diagnosis not present

## 2020-11-20 DIAGNOSIS — I5022 Chronic systolic (congestive) heart failure: Secondary | ICD-10-CM | POA: Diagnosis not present

## 2020-11-20 DIAGNOSIS — M1990 Unspecified osteoarthritis, unspecified site: Secondary | ICD-10-CM | POA: Diagnosis not present

## 2020-11-20 DIAGNOSIS — M797 Fibromyalgia: Secondary | ICD-10-CM | POA: Diagnosis not present

## 2020-11-20 DIAGNOSIS — H547 Unspecified visual loss: Secondary | ICD-10-CM | POA: Diagnosis not present

## 2020-11-20 DIAGNOSIS — F319 Bipolar disorder, unspecified: Secondary | ICD-10-CM | POA: Diagnosis not present

## 2020-11-20 DIAGNOSIS — J9621 Acute and chronic respiratory failure with hypoxia: Secondary | ICD-10-CM | POA: Diagnosis not present

## 2020-11-20 DIAGNOSIS — Z9981 Dependence on supplemental oxygen: Secondary | ICD-10-CM | POA: Diagnosis not present

## 2020-11-20 DIAGNOSIS — F419 Anxiety disorder, unspecified: Secondary | ICD-10-CM | POA: Diagnosis not present

## 2020-11-20 DIAGNOSIS — E1151 Type 2 diabetes mellitus with diabetic peripheral angiopathy without gangrene: Secondary | ICD-10-CM | POA: Diagnosis not present

## 2020-11-20 DIAGNOSIS — Z794 Long term (current) use of insulin: Secondary | ICD-10-CM | POA: Diagnosis not present

## 2020-11-20 DIAGNOSIS — N179 Acute kidney failure, unspecified: Secondary | ICD-10-CM | POA: Diagnosis not present

## 2020-11-20 DIAGNOSIS — K219 Gastro-esophageal reflux disease without esophagitis: Secondary | ICD-10-CM | POA: Diagnosis not present

## 2020-11-20 DIAGNOSIS — E1122 Type 2 diabetes mellitus with diabetic chronic kidney disease: Secondary | ICD-10-CM | POA: Diagnosis not present

## 2020-11-20 DIAGNOSIS — E7800 Pure hypercholesterolemia, unspecified: Secondary | ICD-10-CM | POA: Diagnosis not present

## 2020-11-20 DIAGNOSIS — N1831 Chronic kidney disease, stage 3a: Secondary | ICD-10-CM | POA: Diagnosis not present

## 2020-11-20 DIAGNOSIS — J441 Chronic obstructive pulmonary disease with (acute) exacerbation: Secondary | ICD-10-CM | POA: Diagnosis not present

## 2020-11-20 DIAGNOSIS — I4891 Unspecified atrial fibrillation: Secondary | ICD-10-CM | POA: Diagnosis not present

## 2020-11-20 DIAGNOSIS — J9622 Acute and chronic respiratory failure with hypercapnia: Secondary | ICD-10-CM | POA: Diagnosis not present

## 2020-11-20 DIAGNOSIS — I5032 Chronic diastolic (congestive) heart failure: Secondary | ICD-10-CM

## 2020-11-20 NOTE — Patient Outreach (Signed)
[facility]) Care Management  [place]  11/20/2020   Andriea Hasegawa [facility] 11-29-1950 [id number]  Telephone Assessment   Subjective:  Incoming call from patient , she called to inform me that she was at home from the hospital. She discussed concern regarding care from staff states she was able to concern voice her concerns representative that visited her. She discussed that she couldn't stay in the hospital any longer.  She discussed having visit from [facility] therapist on today for evaluation to get started . She reports decreased swelling in her legs but still has some swelling. She reports not weighing on today due to everything going on today.  Patient voiced that she is still has some fluid to get rid of , she states receiving Metolazone 5 mg tablet while in the hospital, discussed since she left against medical advice she did not get after visit summary with new medication list. Patient states that she took Metolazone whole tablet on today, of her 2.5 mg tablet ,reviewed with her medication list in epic metolazone 1/2 tablet 3 days a week. Patient concern that this will not be enough to help with keeping fluid off , she wants to know what to do.   Placed call to Dr. Harl Bowie office spoke with representative Amy to discuss patient concern regarding her daily Metolazone zone and requested patient need for follow up on dose of metolazone .  She discussed that she did not get to weigh on today as she had early therapy visit today. She discussed having diarrhea to today stating that happens when she gets upset.  Patient states that she has placed call to, Serious illness program, NP Enis Gash to notify that she is home from the hospital .    Objective:   Encounter Medications:  Outpatient Encounter Medications as of 11/20/2020  Medication Sig   albuterol (PROVENTIL) (2.5 MG/3ML) 0.083% nebulizer solution Take 3 mLs (2.5 mg total) by nebulization  every 6 (six) hours as needed for wheezing or shortness of breath.   allopurinol (ZYLOPRIM) 100 MG tablet TAKE 1 TABLET BY MOUTH ONCE DAILY. (Patient taking differently: Take 100 mg by mouth daily.)   apixaban (ELIQUIS) 5 MG TABS tablet Take 1 tablet (5 mg total) by mouth 2 (two) times daily.   atorvastatin (LIPITOR) 20 MG tablet Take 1 tablet (20 mg total) by mouth daily.   budesonide (PULMICORT) 0.5 MG/2ML nebulizer solution Take 2 mLs (0.5 mg total) by nebulization 2 (two) times daily.   cetirizine (ZYRTEC) 10 MG tablet Take 10 mg by mouth daily.   cholecalciferol (VITAMIN D) 400 units TABS tablet Take 1 tablet (400 Units total) by mouth 2 (two) times daily.   clobetasol cream (TEMOVATE) 1.66 % Apply 1 application topically daily.    colchicine 0.6 MG tablet Take 0.6 mg by mouth daily as needed. gout   escitalopram (LEXAPRO) 10 MG tablet Take 1 tablet by mouth daily.   FEROSUL 325 (65 Fe) MG tablet Take 325 mg by mouth daily.    fluticasone (FLONASE) 50 MCG/ACT nasal spray Place 1 spray into both nostrils 2 (two) times daily.   furosemide (LASIX) 40 MG tablet TAKE 3 TABLETS BY MOUTH TWICE DAILY   Insulin Glargine (BASAGLAR KWIKPEN) 100 UNIT/ML Inject 20 Units into the skin 2 (two) times daily. (Patient taking differently: Inject 30 Units into the skin 2 (two) times daily.)   insulin lispro (HUMALOG) 100 UNIT/ML KiwkPen Inject 0.02 mLs (2 Units total) into the skin  3 (three) times daily before meals.   ipratropium (ATROVENT) 0.02 % nebulizer solution Take 2.5 mLs (0.5 mg total) by nebulization every 6 (six) hours as needed for wheezing or shortness of breath.   metolazone (ZAROXOLYN) 2.5 MG tablet Take 1.25 mg by mouth 3 (three) times a week.   metoprolol succinate (TOPROL XL) 25 MG 24 hr tablet Take 1.5 tablets (37.5 mg total) by mouth daily.   mirtazapine (REMERON) 15 MG tablet Take 1 tablet (15 mg total) by mouth at bedtime.   modafinil (PROVIGIL) 200 MG tablet Take 1 tablet (200 mg total) by  mouth daily.   montelukast (SINGULAIR) 10 MG tablet Take 1 tablet (10 mg total) by mouth at bedtime.   oxyCODONE-acetaminophen (PERCOCET/ROXICET) 5-325 MG tablet Take 1 tablet by mouth 2 (two) times daily as needed for severe pain.   pantoprazole (PROTONIX) 20 MG tablet TAKE (1) TABLET BY MOUTH TWICE A DAY BEFORE MEALS. (BREAKFAST AND SUPPER) (Patient taking differently: Take 20 mg by mouth 2 (two) times daily before a meal.)   potassium chloride SA (KLOR-CON) 20 MEQ tablet Take 20 mEq by mouth 2 (two) times daily.   primidone (MYSOLINE) 50 MG tablet TAKE ONE TABLET BY MOUTH AT BEDTIME. (Patient taking differently: Take 50 mg by mouth at bedtime.)   Probiotic Product (PROBIOTIC PO) Take 1 tablet by mouth at bedtime.   revefenacin (YUPELRI) 175 MCG/3ML nebulizer solution Take 175 mcg by nebulization daily.   TRUE METRIX BLOOD GLUCOSE TEST test strip USE TO TEST BLOOD SUGAR AS DIRECTED.   Vitamin A 2400 MCG (8000 UT) CAPS Take 8,000 Units by mouth every morning.    Vitamin D, Ergocalciferol, (DRISDOL) 1.25 MG (50000 UNIT) CAPS capsule Take 1 capsule (50,000 Units total) by mouth every 7 (seven) days.   [DISCONTINUED] potassium chloride 20 MEQ TBCR Take 20 mEq by mouth 2 (two) times daily.   No facility-administered encounter medications on file as of 11/20/2020.    Functional Status:  In your present state of health, do you have any difficulty performing the following activities: 11/14/2020 03/23/2020  Hearing? N Y  Vision? Y Y  Difficulty concentrating or making decisions? N N  Walking or climbing stairs? Y Y  Comment - -  Dressing or bathing? Y Y  Comment - -  Doing errands, shopping? [place] and eating ? - -  Comment - -  Using the Toilet? - -  In the past six months, have you accidently leaked urine? - -  Do you have problems with loss of bowel control? - -  Managing your Medications? - -  Managing your Finances? - -  Comment - -  Housekeeping or managing  your Housekeeping? - -  Comment - -  Some recent data might be hidden    Fall/Depression Screening: Fall Risk  02/01/2020 06/11/2019 05/17/2019  Falls in the past year? 1 1 1   Number falls in past yr: 0 1 1  Injury with Fall? 0 1 1  Comment - - -  Risk Factor Category  - - -  Comment - - -  Risk for fall due to : History of fall(s);Impaired balance/gait;Impaired vision History of fall(s) History of fall(s);Impaired vision;Impaired mobility  Risk for fall due to: Comment - - -  Follow up Falls prevention discussed Falls evaluation completed;Education provided Education provided;Falls prevention discussed   PHQ 2/9 Scores 09/19/2020 05/17/2019 11/28/2017 11/27/2017 10/03/2017 09/15/2017 09/05/2017  PHQ - 2 Score 1 0 0 5  6 0 0  PHQ- 9 Score - - - - 23 - -    Assessment:   Care Plan Care Plan : Heart Failure (Adult)  Updates made by Alfonzo Feller, RN since 11/20/2020 12:00 AM     Problem: Disease Progression (Heart Failure)   Priority: High  Onset Date: 03/14/2020     Long-Range Goal: Health Optimized as evidenced by patient report of no hospital readmission over the next 60 days   Start Date: 03/14/2020  Expected End Date: 11/30/2020  Recent Progress: On track  Priority: High  Note:   Evidence-based guidance:  Assess need for potential diet and fluid modification, such as reduced sodium or fluid intake.  Minimize unnecessary dietary restrictions to increase oral intake. Note: Sodium restriction should be individualized to the patient and clinical status.  Facilitate home monitoring of weight.   Notes: Has not weighed today urged importance of monitoring daily, discussed home scale functioning. Emphasized action plan of weight gain greater than 3 pound in a day, 5 in a week , increased swelling. Stressed importance of notifying MD sooner to prevent hospital admission. Reviewed weight at hospital admission weight at discharge at 215lbs.     Problem: Activity Tolerance (Heart  Failure)   Priority: High  Onset Date: 03/14/2020     Long-Range Goal: Activity Tolerance Optimized as evidenced by patient report of ability to complete usual daily activity in home over the next 90 days   Start Date: 03/14/2020  Expected End Date: 11/30/2020  Recent Progress: On track  Priority: High  Note:   Evidence-based guidance:   Encourage optimal, safe functional mobility and self-care performance based on ability and tolerance.   Promote breathing and energy conservation techniques, such as pursed-lip breathing, preplanning and pacing of activity, balancing activity and rest.  Notes: reinforced importance of participation in home physical therapy , fall prevention measures reinforced. He benefits to increase strength. Patient requiring assistance with adl's husband assist.     Care Plan : Diabetes Type 2 (Adult)  Updates made by Alfonzo Feller, RN since 11/20/2020 12:00 AM     Problem: Glycemic Management (Diabetes, Type 2)   Priority: High  Onset Date: 03/14/2020     Long-Range Goal: Glycemic Management Optimized as evidenced by patient report of eating regular balanced  meals over the next 60 days   Start Date: 08/09/2020  Expected End Date: 11/30/2020  Recent Progress: On track  Priority: High  Note:   Evidence-based guidance:  Anticipate A1C testing (point-of-care) every 3 to 6 months based on goal attainment.  Review mutually-set A1C goal or target range.  .  Promote self-monitoring of blood glucose levels.  Notes: Discussed recent blood sugar reading at home,  reading 200 at hs  and less than 200 this am. discussed blood sugar controlled . She denies hypoglycemia episodes     Task: Alleviate Barriers to Glycemic Management   Due Date: 11/30/2020  Priority: Routine  Note:   Care Management Activities:    - A1C testing facilitated - blood glucose monitoring encouraged - blood glucose readings reviewed - self-awareness of signs/symptoms of hypo or  hyperglycemia encouraged    Barriers: Health Behaviors Knowledge  Notes : Discussed with patient to take meter to appointment  with PCP this week     Problem: Disease Progression (Diabetes, Type 2)   Priority: Medium  Onset Date: 03/14/2020     Long-Range Goal: Disease Progression Prevented or Minimized as evidenced by patient report of reduction in Hemoglobin A1c  by 2 points over the next 90 days   Start Date: 08/09/2020  Expected End Date: 11/30/2020  Recent Progress: On track  Priority: Medium  Note:   Evidence-based guidance:  Prepare patient for laboratory and diagnostic exams based on risk and present  Address pregnancy planning and contraceptive choice, especially when prescribing antihypertensive or statin.  Ensure completion of annual comprehensive foot exam and dilated eye exam.   Implement additional individualized goals and interventions based on identified risk factors.   Notes: Reviewed recent changes in insulin as prescribed by PCP     Task: Monitor and Manage Follow-up for Comorbidities   Due Date: 11/30/2020  Priority: Routine  Note:   Care Management Activities:    - healthy lifestyle promoted - signs/symptoms of comorbidities identified  -rescheduling missed appointments with nephrology  and annual eye exam reinforced   Barriers: Health Behaviors Knowledge Visual  Notes: Discussed ongoing follow up need for diabetes maintenance , nephrology follow up as recommended      Care Plan : Wellness (Adult)  Updates made by Alfonzo Feller, RN since 11/20/2020 12:00 AM     Problem: Medication Adherence (Wellness)   Priority: Medium  Onset Date: 03/14/2020     Long-Range Goal: Medication Adherence Maintained as evidenced by patient report of taking medication as prescribed over the next 60 days   Start Date: 03/14/2020  Expected End Date: 11/30/2020  Recent Progress: On track  Priority: Medium  Note:   Evidence-based guidance:  Develop a complete and  accurate medication list including those prescribed and over-the-counter, those taken only occasionally and those not taken by mouth such as injections, inhalers, ointments or creams and drops.  Encourage the use of medication reminders such as clock or cell phone alarm, color coding, pillboxes for am/pm and days of the week, pharmacy refill reminder, auto-refill system or mail-order option. Using pill packaging option .  Marland Kitchen  Provide frequent follow-up providing motivation, encouragement and support when medication nonadherence is identified.   Notes: Call to cardiology office regarding metolazone dose . Patient reviewed recent changes to insulin by PCP, Bas[place] 8 units each night and 7 units of humalog at meals .     Task: Optimize Medication Use   Due Date: 11/30/2020  Priority: Routine  Note:   Care Management Activities:    - administration or use of medication demonstrated - understanding of current medications assessed   Barriers: Health Behaviors Knowledge  Notes: Advised regarding importance of adherence to current medication plan, encouraged notifying MD of question related to symptoms.       Goals Addressed             This Visit's Progress    Find Help in My Community   On track    Timeframe:  Short-Term Goal Priority:  Medium Start Date:  09/11/20                           Expected End Date:   10/30/20                    Follow Up Date 11/30/20   - follow-up on any referrals for help I am given    Why is this important?   Knowing how and where to find help for yourself or family in your neighborhood and community is an important skill.  You will want to take some steps to learn how.    Barriers: Knowledge  Notes:  Discussed PCP plans for home health RN services, encouraged to return call to [facility] regarding medicaid.       Making and keeping appointments   On track    Follow Up Date 11/30/20 Timeframe:  Long-Range Goal Priority:  High Start Date:    03/29/20                          Expected End Date: 12/30/20                    - call to cancel if needed - keep a calendar with appointment dates Reschedule missed appointments  reinforced    Why is this important?   Part of staying healthy is seeing the doctor for follow-up care.  If you forget your appointments, there are some things you can do to stay on track.    Barriers: Health Behaviors Transportation  Notes: Reviewed upcoming appointments , with cardiology, agreeable to assistance with help in  scheduling ride with [facility] transportation. Patient husband will assist with transportation to PCP.       Monitor and Manage My Blood Sugar   On track    Follow Up Date 11/30/20 Timeframe:  Long-Range Goal Priority:  High Start Date:  03/14/20                           Expected End Date:  12/30/20                       - check blood sugar at prescribed times - check blood sugar if I feel it is too high or too low - take the blood sugar meter to all doctor visits  Replace libre sensor to monitor blood sugars as recommended and with assistance of office as needed    Why is this important?   Checking your blood sugar at home helps to keep it from getting very high or very low.  Writing the results in a diary or log helps the doctor know how to care for you.  Your blood sugar log should have the time, date and the results.  Also, write down the amount of insulin or other medicine that you take.  Other information, like what you ate, exercise done and how you were feeling, will also be helpful.  Barriers: Health Behaviors Knowledge     Notes: Denies hypoglycemia episodes ,       Obtain Eye Exam   Not on track    Follow Up Date 12/30/20 Timeframe:  Long-Range Goal Priority:  Medium Start Date:   1012/21                          Expected End Date:    12/30/20                - keep appointment with eye doctor  Rescheduled missed appointments  encouraged    Why is this important?   Eye  check-ups are important when you have diabetes.  Vision loss can be prevented.   Barriers: Health Behaviors  Notes:  Stressed importance of annual eye exam, patient agreement to rescheduling after cardiology appointments       Set My Target A1C   On track    Follow Up Date: 11/30/20 Timeframe:  Long-Range Goal Priority:  Medium Start Date:    03/14/20  Expected End Date:   11/30/20                  - set target A1C    Why is this important?   Your target A1C is decided together by you and your doctor.  It is based on several things like your age and other health issues.    Barriers: Knowledge  Notes:  Celebrated recent improvement in A1c to 7.1 % .      track and manage activity and exertion   On track    Follow Up Date: 11/30/20 Timeframe:  Long-Range Goal Priority:  Medium Start Date:   03/14/20                          Expected End Date: 7/30/ 22                       - pace activity allowing for rest     Why is this important?   Exercising is very important when managing your heart failure.  It will help your heart get stronger.    Notes:  Initial home PT visit  today, reinforced participation .       Track and Manage Fluids and Swelling   On track    Follow Up Date 11/28/20  Timeframe:  Long-Range Goal Priority:  Medium Start Date:     03/14/20                        Expected End Date:   12/30/20                 - call office if I gain more than 2 pounds in one day or 5 pounds in one week - do ankle pumps when sitting - keep legs up while sitting - track weight in diary - watch for swelling in feet, ankles and legs every day - weigh myself daily  Reinforced action plan for yellow zone symptoms .  Barriers: Health Behaviors Knowledge  Why is this important?   It is important to check your weight daily and watch how much salt and liquids you have.  It will help you to manage your heart failure.  Notes: Has not weighted today,  reinforced importance and daily weighs stressed when to call MD       Track and Manage Symptoms   On track    Follow Up Date 11/28/20 Timeframe:  Long-Range Goal Priority:  Medium Start Date:   03/14/20                          Expected End Date: 12/30/20                      - bring diary to all appointments - follow rescue plan if symptoms flare-up - know when to call the doctor - dress right for the weather, hot or cold  Reinforced daily heart failure management skills limiting salt, weighing daily, taking medications as prescribed   Why is this important?   You will be able to handle your symptoms better if you keep track of them.  Making some simple changes to your lifestyle will help.  Eating healthy is one thing you can do to take good care of yourself. Barriers: Health Behaviors Knowledge  Notes: Reinforced daily monitoring for heart failure and reinforced symptoms  worsening symptoms , denies worsening symptoms at this time.          Plan:  Follow-up: Patient agrees to Care Plan and Follow-up. Will plan return call to patient in the next week, reinforced with patient to call sooner for new concern and action plan for worsening symptoms.  Received return call from [place] at Dr. Harl Bowie office she reports patient dose of Metolazone should be as on epic list, she states that she will make call to patient .   Joylene Draft, RN, BSN  [place] Management Coordinator  [phone]- Mobile [phone]- Toll Free Main Office

## 2020-11-20 NOTE — Telephone Encounter (Signed)
Contacted patient who stated that she still has fluid built up in her legs and feet and that it is hard for her to move them at this time.Pt would like to have an increase in Metolazone until the weight comes off. Pt states that she was taking a whole 5 mg tablet while she was hospitalized. Please advise.

## 2020-11-20 NOTE — Telephone Encounter (Signed)
[name] from [facility] called to get the correct dosage the patient should be taking of her metolazone. She was admitted in [facility] on 6/14-6/19. She was taking 5mg  tablet daily then but the last time before admission she was taking 1/2 tablet 3 times a week. Please call the patient to let her know what she shouls be taking.

## 2020-11-20 NOTE — Telephone Encounter (Signed)
Patient wants to know what she is to do, she can not get her clothes on she is so swollen full of fluids , she said she was in the hospital yesterday and signed out ama because they werent helping her

## 2020-11-20 NOTE — TOC Transition Note (Signed)
Orders for [facility] services were not placed at D/C. Linda with [facility] inquired about what services were needed. Informed that PT and RN services were going to be needed. Vaughan Basta will place verbal orders. TOC signing off.

## 2020-11-21 MED ORDER — BUMETANIDE 1 MG PO TABS: 3.0000 mg | ORAL_TABLET | Freq: Two times a day (BID) | ORAL | 2 refills | Status: AC

## 2020-11-21 NOTE — Telephone Encounter (Signed)
Will be far more difficult to manage her at home. I would stop lasix and start bumex 3mg  bid. Needs BMET/Mg on Friday. I would continue metolazone as she had been taking it, we have to try to protect her kidneys and this medicine can be very rough on kidney function   Zandra Abts MD

## 2020-11-21 NOTE — Telephone Encounter (Signed)
Patient informed and verbalized understanding of plan. Lab order faxed to [facility]. 

## 2020-11-23 DIAGNOSIS — N1831 Chronic kidney disease, stage 3a: Secondary | ICD-10-CM | POA: Diagnosis not present

## 2020-11-23 DIAGNOSIS — I130 Hypertensive heart and chronic kidney disease with heart failure and stage 1 through stage 4 chronic kidney disease, or unspecified chronic kidney disease: Secondary | ICD-10-CM | POA: Diagnosis not present

## 2020-11-23 DIAGNOSIS — N179 Acute kidney failure, unspecified: Secondary | ICD-10-CM | POA: Diagnosis not present

## 2020-11-23 DIAGNOSIS — I5022 Chronic systolic (congestive) heart failure: Secondary | ICD-10-CM | POA: Diagnosis not present

## 2020-11-23 DIAGNOSIS — E1122 Type 2 diabetes mellitus with diabetic chronic kidney disease: Secondary | ICD-10-CM | POA: Diagnosis not present

## 2020-11-23 DIAGNOSIS — I5033 Acute on chronic diastolic (congestive) heart failure: Secondary | ICD-10-CM | POA: Diagnosis not present

## 2020-11-27 DIAGNOSIS — M069 Rheumatoid arthritis, unspecified: Secondary | ICD-10-CM | POA: Diagnosis not present

## 2020-11-27 DIAGNOSIS — I5023 Acute on chronic systolic (congestive) heart failure: Secondary | ICD-10-CM | POA: Diagnosis not present

## 2020-11-27 DIAGNOSIS — N186 End stage renal disease: Secondary | ICD-10-CM | POA: Diagnosis not present

## 2020-11-27 DIAGNOSIS — J449 Chronic obstructive pulmonary disease, unspecified: Secondary | ICD-10-CM | POA: Diagnosis not present

## 2020-11-27 DIAGNOSIS — I480 Paroxysmal atrial fibrillation: Secondary | ICD-10-CM | POA: Diagnosis not present

## 2020-11-27 DIAGNOSIS — D508 Other iron deficiency anemias: Secondary | ICD-10-CM | POA: Diagnosis not present

## 2020-11-27 DIAGNOSIS — E1122 Type 2 diabetes mellitus with diabetic chronic kidney disease: Secondary | ICD-10-CM | POA: Diagnosis not present

## 2020-11-27 DIAGNOSIS — Z832 Family history of diseases of the blood and blood-forming organs and certain disorders involving the immune mechanism: Secondary | ICD-10-CM | POA: Diagnosis not present

## 2020-11-27 DIAGNOSIS — Z8261 Family history of arthritis: Secondary | ICD-10-CM | POA: Diagnosis not present

## 2020-11-27 DIAGNOSIS — N185 Chronic kidney disease, stage 5: Secondary | ICD-10-CM | POA: Diagnosis not present

## 2020-11-27 DIAGNOSIS — G894 Chronic pain syndrome: Secondary | ICD-10-CM | POA: Diagnosis not present

## 2020-11-28 ENCOUNTER — Other Ambulatory Visit: Payer: Self-pay | Admitting: *Deleted

## 2020-11-28 ENCOUNTER — Ambulatory Visit: Payer: Medicare Other | Admitting: Pulmonary Disease

## 2020-11-28 DIAGNOSIS — N1831 Chronic kidney disease, stage 3a: Secondary | ICD-10-CM | POA: Diagnosis not present

## 2020-11-28 DIAGNOSIS — N179 Acute kidney failure, unspecified: Secondary | ICD-10-CM | POA: Diagnosis not present

## 2020-11-28 DIAGNOSIS — I5033 Acute on chronic diastolic (congestive) heart failure: Secondary | ICD-10-CM | POA: Diagnosis not present

## 2020-11-28 DIAGNOSIS — E1122 Type 2 diabetes mellitus with diabetic chronic kidney disease: Secondary | ICD-10-CM | POA: Diagnosis not present

## 2020-11-28 DIAGNOSIS — I5022 Chronic systolic (congestive) heart failure: Secondary | ICD-10-CM | POA: Diagnosis not present

## 2020-11-28 DIAGNOSIS — I130 Hypertensive heart and chronic kidney disease with heart failure and stage 1 through stage 4 chronic kidney disease, or unspecified chronic kidney disease: Secondary | ICD-10-CM | POA: Diagnosis not present

## 2020-11-28 NOTE — Patient Outreach (Signed)
[facility]) Care Management  [place]  11/28/2020   NKECHI LINEHAN 07-05-1950 [id number]  Telephone assessment   Subjective:  Outreach to patient on today, she reports feeling some better. She discussed visit with PCP on yesterday, with lab work and reports medication changes with Metolazone  to 5 mg daily stating PCP follow up with her Kidney doctor. She reports continuing to have weeping and swelling at lower extremities and compression dressing applied and will need to be changed by Thursday. Patient explains having home health visit on today but unsure if it is therapist or a nurse.  She reports her weight being down to 208 no sudden weight gain. S he pleased to have a recliner lift chair donated by [employer] to help with keeping her legs elevated she reports sleeping in chair also as difficulty getting into bed with leg swelling.  She denies hypoglycemia episodes, awaiting new freestyle libre and sensors currently doing finger sticks.   Objective:   Encounter Medications:  Outpatient Encounter Medications as of 11/28/2020  Medication Sig Note   albuterol (PROVENTIL) (2.5 MG/3ML) 0.083% nebulizer solution Take 3 mLs (2.5 mg total) by nebulization every 6 (six) hours as needed for wheezing or shortness of breath.    allopurinol (ZYLOPRIM) 100 MG tablet TAKE 1 TABLET BY MOUTH ONCE DAILY. (Patient taking differently: Take 100 mg by mouth daily.)    apixaban (ELIQUIS) 5 MG TABS tablet Take 1 tablet (5 mg total) by mouth 2 (two) times daily.    atorvastatin (LIPITOR) 20 MG tablet Take 1 tablet (20 mg total) by mouth daily.    budesonide (PULMICORT) 0.5 MG/2ML nebulizer solution Take 2 mLs (0.5 mg total) by nebulization 2 (two) times daily.    bumetanide (BUMEX) 1 MG tablet Take 3 tablets (3 mg total) by mouth 2 (two) times daily.    cetirizine (ZYRTEC) 10 MG tablet Take 10 mg by mouth daily.    cholecalciferol (VITAMIN D) 400 units TABS tablet Take 1 tablet  (400 Units total) by mouth 2 (two) times daily.    clobetasol cream (TEMOVATE) 0.63 % Apply 1 application topically daily.     colchicine 0.6 MG tablet Take 0.6 mg by mouth daily as needed. gout    escitalopram (LEXAPRO) 10 MG tablet Take 1 tablet by mouth daily.    FEROSUL 325 (65 Fe) MG tablet Take 325 mg by mouth daily.     fluticasone (FLONASE) 50 MCG/ACT nasal spray Place 1 spray into both nostrils 2 (two) times daily.    Insulin Glargine (BASAGLAR KWIKPEN) 100 UNIT/ML Inject 20 Units into the skin 2 (two) times daily. (Patient taking differently: Inject 30 Units into the skin 2 (two) times daily.)    insulin lispro (HUMALOG) 100 UNIT/ML KiwkPen Inject 0.02 mLs (2 Units total) into the skin 3 (three) times daily before meals.    ipratropium (ATROVENT) 0.02 % nebulizer solution Take 2.5 mLs (0.5 mg total) by nebulization every 6 (six) hours as needed for wheezing or shortness of breath.    metolazone (ZAROXOLYN) 2.5 MG tablet Take 1.25 mg by mouth 3 (three) times a week. 11/28/2020: New order per PCP , 5 mg daily    metoprolol succinate (TOPROL XL) 25 MG 24 hr tablet Take 1.5 tablets (37.5 mg total) by mouth daily.    mirtazapine (REMERON) 15 MG tablet Take 1 tablet (15 mg total) by mouth at bedtime.    modafinil (PROVIGIL) 200 MG tablet Take 1 tablet (200 mg total) by mouth  daily.    montelukast (SINGULAIR) 10 MG tablet Take 1 tablet (10 mg total) by mouth at bedtime.    oxyCODONE-acetaminophen (PERCOCET/ROXICET) 5-325 MG tablet Take 1 tablet by mouth 2 (two) times daily as needed for severe pain.    pantoprazole (PROTONIX) 20 MG tablet TAKE (1) TABLET BY MOUTH TWICE A DAY BEFORE MEALS. (BREAKFAST AND SUPPER) (Patient taking differently: Take 20 mg by mouth 2 (two) times daily before a meal.)    potassium chloride SA (KLOR-CON) 20 MEQ tablet Take 20 mEq by mouth 2 (two) times daily.    primidone (MYSOLINE) 50 MG tablet TAKE ONE TABLET BY MOUTH AT BEDTIME. (Patient taking differently: Take 50 mg  by mouth at bedtime.)    Probiotic Product (PROBIOTIC PO) Take 1 tablet by mouth at bedtime.    revefenacin (YUPELRI) 175 MCG/3ML nebulizer solution Take 175 mcg by nebulization daily.    TRUE METRIX BLOOD GLUCOSE TEST test strip USE TO TEST BLOOD SUGAR AS DIRECTED.    Vitamin A 2400 MCG (8000 UT) CAPS Take 8,000 Units by mouth every morning.     Vitamin D, Ergocalciferol, (DRISDOL) 1.25 MG (50000 UNIT) CAPS capsule Take 1 capsule (50,000 Units total) by mouth every 7 (seven) days.    [DISCONTINUED] potassium chloride 20 MEQ TBCR Take 20 mEq by mouth 2 (two) times daily.    No facility-administered encounter medications on file as of 11/28/2020.    Functional Status:  In your present state of health, do you have any difficulty performing the following activities: 11/14/2020 03/23/2020  Hearing? N Y  Vision? Y Y  Difficulty concentrating or making decisions? N N  Walking or climbing stairs? Y Y  Comment - -  Dressing or bathing? Y Y  Comment - -  Doing errands, shopping? [place] and eating ? - -  Comment - -  Using the Toilet? - -  In the past six months, have you accidently leaked urine? - -  Do you have problems with loss of bowel control? - -  Managing your Medications? - -  Managing your Finances? - -  Comment - -  Housekeeping or managing your Housekeeping? - -  Comment - -  Some recent data might be hidden    Fall/Depression Screening: Fall Risk  02/01/2020 06/11/2019 05/17/2019  Falls in the past year? 1 1 1   Number falls in past yr: 0 1 1  Injury with Fall? 0 1 1  Comment - - -  Risk Factor Category  - - -  Comment - - -  Risk for fall due to : History of fall(s);Impaired balance/gait;Impaired vision History of fall(s) History of fall(s);Impaired vision;Impaired mobility  Risk for fall due to: Comment - - -  Follow up Falls prevention discussed Falls evaluation completed;Education provided Education provided;Falls prevention discussed   PHQ  2/9 Scores 09/19/2020 05/17/2019 11/28/2017 11/27/2017 10/03/2017 09/15/2017 09/05/2017  PHQ - 2 Score 1 0 0 5 6 0 0  PHQ- 9 Score - - - - 23 - -    Assessment:  Patient will benefit from ongoing complex care management for chronic medical conditions.   Care Plan Care Plan : Heart Failure (Adult)  Updates made by Alfonzo Feller, RN since 11/28/2020 12:00 AM     Problem: Disease Progression (Heart Failure)   Priority: High  Onset Date: 03/14/2020     Goal: Health Optimized as evidenced by patient report of no hospital readmission over the next 30 days  Start Date: 11/20/2020  Expected End Date: 12/30/2020  Recent Progress: On track  Priority: High  Note:   Evidence-based guidance:  Assess need for potential diet and fluid modification, such as reduced sodium or fluid intake.  Minimize unnecessary dietary restrictions to increase oral intake. Note: Sodium restriction should be individualized to the patient and clinical status.  Facilitate home monitoring of weight.   Notes:Reviewed recent weight today 208 weight down . Discussed continued fluid in legs with weeping compression dressing to legs.     Task: Optimize Health   Due Date: 12/30/2020  Priority: Routine  Note:   Care Management Activities:    - home monitoring of weight gain or loss encouraged - unnecessary dietary restrictions minimized - weight gain or loss reviewed and trended   Barriers: Health Behaviors Knowledge  Notes: Emphasized keeping legs elevated as much as possible continue limiting salt in diet. Daily monitoring of sudden worsening symptoms of heart failure to notify MD .     Problem: Activity Tolerance (Heart Failure)   Priority: High  Onset Date: 03/14/2020     Long-Range Goal: Activity Tolerance Optimized as evidenced by patient report of ability to complete usual daily activity in home over the next 60 days   Start Date: 11/20/2020  Expected End Date: 01/31/2021  Recent Progress: On track   Priority: High  Note:   Evidence-based guidance:   Encourage optimal, safe functional mobility and self-care performance based on ability and tolerance.   Promote breathing and energy conservation techniques, such as pursed-lip breathing, preplanning and pacing of activity, balancing activity and rest.  Notes: participating in home health therapy, fall safety measures reviewed keeping frequent used items including phone when husband not present.     Task: Maintain Strength and Functional Ability   Due Date: 01/31/2021  Priority: Routine  Note:   Care Management Activities:    - activity or exercise based on tolerance encouraged - energy conservation techniques promoted - self-care encouraged Barriers: Health Behaviors Knowledge  Knowledge    Notes Reinforced  safety related to increase in fluid in lower legs, discussed elevation on lower extremities as tolerated , using left chair recliner instead of legs being dependent while sitting. Encouraged regarding benefit of  participation in chair exercises to help increase strength improve balance.     Care Plan : Diabetes Type 2 (Adult)  Updates made by Alfonzo Feller, RN since 11/28/2020 12:00 AM     Problem: Glycemic Management (Diabetes, Type 2)   Priority: High  Onset Date: 03/14/2020     Long-Range Goal: Glycemic Management Optimized as evidenced by patient report of eating regular balanced  meals over the next 60 days Completed 11/28/2020  Start Date: 08/09/2020  Expected End Date: 11/30/2020  Recent Progress: On track  Priority: High  Note:   Evidence-based guidance:  Anticipate A1C testing (point-of-care) every 3 to 6 months based on goal attainment.  Review mutually-set A1C goal or target range.  .  Promote self-monitoring of blood glucose levels.  Notes:     Task: Alleviate Barriers to Glycemic Management Completed 11/28/2020  Due Date: 11/30/2020  Priority: Routine  Note:   Care Management Activities:    -  A1C testing facilitated - blood glucose monitoring encouraged - blood glucose readings reviewed - self-awareness of signs/symptoms of hypo or hyperglycemia encouraged    Barriers: Health Behaviors Knowledge  Notes :     Problem: Disease Progression (Diabetes, Type 2)   Priority: Medium  Onset Date: 03/14/2020  Long-Range Goal: Disease Progression Prevented or Minimized as evidenced by patient report of maintaining A1c at below 8  over the next 90 days   Start Date: 11/28/2020  Expected End Date: 03/02/2021  Recent Progress: On track  Priority: Medium  Note:   Evidence-based guidance:  Prepare patient for laboratory and diagnostic exams based on risk and present  Address pregnancy planning and contraceptive choice, especially when prescribing antihypertensive or statin.  Ensure completion of annual comprehensive foot exam and dilated eye exam.   Implement additional individualized goals and interventions based on identified risk factors.  Goal adjusted  Notes: reviewed recent A1c reading and goal. Reinforced adherence for annual eye exam.     Task: Monitor and Manage Follow-up for Comorbidities   Due Date: 03/02/2021  Priority: Routine  Note:   Care Management Activities:    - healthy lifestyle promoted - signs/symptoms of comorbidities identified  -rescheduling missed appointments with nephrology  and annual eye exam reinforced  Encouraged notifying MD of hypoglycemia episodes.   Barriers: Health Behaviors Knowledge Visual  Notes: Discussed ABC of diabetes management handouts, patient agreeable to rescheduling eye exam appointment . Assessed for signs symptoms of hypoglycemia and hyperglycemia reinforced follow up with PCP, reviewed treating hypoglycemia.     Care Plan : Wellness (Adult)  Updates made by Alfonzo Feller, RN since 11/28/2020 12:00 AM     Problem: Medication Adherence (Wellness)   Priority: Medium  Onset Date: 03/14/2020     Long-Range Goal:  Medication Adherence Maintained as evidenced by patient report of taking medication as prescribed over the next 60 days   Start Date: 03/14/2020  Expected End Date: 11/30/2020  Recent Progress: On track  Priority: Medium  Note:   Evidence-based guidance:  Develop a complete and accurate medication list including those prescribed and over-the-counter, those taken only occasionally and those not taken by mouth such as injections, inhalers, ointments or creams and drops.  Encourage the use of medication reminders such as clock or cell phone alarm, color coding, pillboxes for am/pm and days of the week, pharmacy refill reminder, auto-refill system or mail-order option. Using pill packaging option .  Marland Kitchen  Provide frequent follow-up providing motivation, encouragement and support when medication nonadherence is identified.   Notes: Patient able to review medication changes at PCP visit, expecting delivery 6/29 from [place]      Task: Optimize Medication Use   Due Date: 11/30/2020  Priority: Routine  Note:   Care Management Activities:    - administration or use of medication demonstrated - understanding of current medications assessed   Barriers: Health Behaviors Knowledge  Notes: Emphasized adherence to current medication plan, encouraged notifying MD of question related to symptoms.       Goals Addressed             This Visit's Progress    Find Help in My Community   On track    Timeframe:  Short-Term Goal Priority:  Medium Start Date:  09/11/20                           Expected End Date:   10/30/20                    Follow Up Date 12/28/20   - follow-up on any referrals for help I am given    Why is this important?   Knowing how and where to find help for yourself or family in  your neighborhood and community is an important skill.  You will want to take some steps to learn how.    Barriers: Knowledge  Notes:  Calls to PCP office regarding need for [facility] for  compression dressing changes to lower legs, Call to Serious illness regarding following up with patient regarding next home visit. Celebrated patient receiving Lift chair after contact with [employer].       Making and keeping appointments   On track    Follow Up Date 12/07/20 Timeframe:  Long-Range Goal Priority:  High Start Date:    03/29/20                         Expected End Date: 12/30/20                    - call to cancel if needed - keep a calendar with appointment dates Reschedule missed appointments  reinforced    Why is this important?   Part of staying healthy is seeing the doctor for follow-up care.  If you forget your appointments, there are some things you can do to stay on track.    Barriers: Health Behaviors Transportation  Notes: Celebrated attending PCP post discharge visit. Reviewed upcoming appointments with Pulmonary and Cardiology, will follow up in next week if she needs transportation assistance arranged.       Manage My Medicine   On track    Follow Up Date 12/28/20 Timeframe:  Long-Range Goal Priority:  High Start Date:     03/14/20                       Expected End Date:  12/30/20                    - call for medicine refill 2 or 3 days before it runs out  - take medications as prescribed per most recent after visit summary   Why is this important?   These steps will help you keep on track with your medicines.    Barriers: None  Notes:  During visit patient explained new medication changes to me of metolazone, magnesium level low provider to prescribe medication.       Monitor and Manage My Blood Sugar   On track    Follow Up Date 12/28/20 Timeframe:  Long-Range Goal Priority:  High Start Date:  03/14/20                           Expected End Date:  12/30/20                       - check blood sugar at prescribed times - check blood sugar if I feel it is too high or too low - take the blood sugar meter to all doctor visits  Replace  libre sensor to monitor blood sugars as recommended and with assistance of office as needed    Why is this important?   Checking your blood sugar at home helps to keep it from getting very high or very low.  Writing the results in a diary or log helps the doctor know how to care for you.  Your blood sugar log should have the time, date and the results.  Also, write down the amount of insulin or other medicine that you take.  Other information, like what you ate,  exercise done and how you were feeling, will also be helpful.  Barriers: Health Behaviors Knowledge     Notes: reviewed recent cbg reading hs 200, am 145 no hypoglycemia episodes       Obtain Eye Exam   Not on track    Follow Up Date 12/30/20 Timeframe:  Long-Range Goal Priority:  Medium Start Date:   1012/21                          Expected End Date:    12/30/20                - keep appointment with eye doctor  Rescheduled missed appointments  encouraged    Why is this important?   Eye check-ups are important when you have diabetes.  Vision loss can be prevented.   Barriers: Health Behaviors  Notes:  Patient agreeable to calling to reschedule missed appointment       COMPLETED: Set My Target A1C   On track    Follow Up Date: 11/30/20 Timeframe:  Long-Range Goal Priority:  Medium Start Date:    03/14/20                         Expected End Date:   11/30/20                  - set target A1C    Why is this important?   Your target A1C is decided together by you and your doctor.  It is based on several things like your age and other health issues.    Barriers: Knowledge  Notes:  Celebrated recent improvement in A1c to 7.1 % .      track and manage activity and exertion   On track    Follow Up Date: 11/30/20 Timeframe:  Long-Range Goal Priority:  Medium Start Date:   03/14/20                          Expected End Date: 7/30/ 22                       - pace activity allowing for rest     Why is this  important?   Exercising is very important when managing your heart failure.  It will help your heart get stronger.    Notes:  Participating in home health therapy, visit for today. Fall prevention measures reviewed. Patient remaining in her recliner chair when spouse running errands       Track and Manage Fluids and Swelling   On track    Follow Up Date 12/07/20 Timeframe:  Long-Range Goal Priority:  Medium Start Date:     03/14/20                        Expected End Date:   12/30/20                 - call office if I gain more than 2 pounds in one day or 5 pounds in one week - do ankle pumps when sitting - keep legs up while sitting - track weight in diary - watch for swelling in feet, ankles and legs every day - weigh myself daily  Reinforced action plan for yellow zone symptoms .  Barriers: Health Behaviors Knowledge  Why is this important?  It is important to check your weight daily and watch how much salt and liquids you have.  It will help you to manage your heart failure.  Notes: continues daily weight monitoring , weight today 208 celebrated keeping a log       Track and Manage Symptoms   On track    Follow Up Date 12/07/20 Timeframe:  Long-Range Goal Priority:  Medium Start Date:   03/14/20                          Expected End Date: 12/30/20                      - bring diary to all appointments - follow rescue plan if symptoms flare-up - know when to call the doctor - dress right for the weather, hot or cold  Reinforced daily heart failure management skills limiting salt, weighing daily, taking medications as prescribed   Why is this important?   You will be able to handle your symptoms better if you keep track of them.  Making some simple changes to your lifestyle will help.  Eating healthy is one thing you can do to take good care of yourself. Barriers: Health Behaviors Knowledge  Notes: emphasized notifying MD of worsening symptoms reviewed, patient able to  identify         Care Coordination Placed call to Serious Illness program to follow up on patient next home visit, spoke with Colletta [place] that explains patient is still on their radar and plans outreach to reschedule home visit. Placed call to [place] , reports patient only has orders for home health physical therapy.  Placed call to [name] , NP office to explain patient will need orders for home health RN sent to [facility] for compression wrap dressing changes, spoke with Rip [place] that will communicate message to providers.   Plan:  Follow-up: Patient agrees to Care Plan and Follow-up.follow up call in the next week .  Will send PCP and cardiology this visit note.    Joylene Draft, RN, BSN  [place] Management Coordinator  [phone]- Mobile [phone]- Toll Free Main Office

## 2020-11-29 ENCOUNTER — Ambulatory Visit: Payer: Medicare Other | Admitting: Cardiology

## 2020-11-29 ENCOUNTER — Other Ambulatory Visit: Payer: Self-pay | Admitting: *Deleted

## 2020-11-29 ENCOUNTER — Encounter: Payer: Self-pay | Admitting: *Deleted

## 2020-11-29 NOTE — Patient Outreach (Signed)
[facility]) Care Management  11/29/2020  Jamyah Folk [facility] 03-Jun-1951 [id number]   Care Coordination    Placed follow up call to [facility] regarding receiving orders for Home health RN.  Representative confirms orders received for home health RN patient to notified prior to visit.     Joylene Draft, RN, BSN  [place] Management Coordinator  [phone]- Mobile [phone]- Toll Free Main Office

## 2020-11-30 DIAGNOSIS — I5033 Acute on chronic diastolic (congestive) heart failure: Secondary | ICD-10-CM | POA: Diagnosis not present

## 2020-11-30 DIAGNOSIS — N179 Acute kidney failure, unspecified: Secondary | ICD-10-CM | POA: Diagnosis not present

## 2020-11-30 DIAGNOSIS — I5022 Chronic systolic (congestive) heart failure: Secondary | ICD-10-CM | POA: Diagnosis not present

## 2020-11-30 DIAGNOSIS — E1122 Type 2 diabetes mellitus with diabetic chronic kidney disease: Secondary | ICD-10-CM | POA: Diagnosis not present

## 2020-11-30 DIAGNOSIS — I130 Hypertensive heart and chronic kidney disease with heart failure and stage 1 through stage 4 chronic kidney disease, or unspecified chronic kidney disease: Secondary | ICD-10-CM | POA: Diagnosis not present

## 2020-11-30 DIAGNOSIS — N1831 Chronic kidney disease, stage 3a: Secondary | ICD-10-CM | POA: Diagnosis not present

## 2020-12-03 ENCOUNTER — Telehealth: Payer: Self-pay | Admitting: Nurse Practitioner

## 2020-12-03 NOTE — Telephone Encounter (Signed)
   Pt called this afternoon to report that wt is up 10 lbs and that she has significant, ongoing, bilat lower ext and abd swelling.  She is currently taking bumex 3mg  BID and metolazone 2.5mg  daily (inc from 3x/wk previously).  She says that she had labs w/ PCP on Friday, but doesn't know results (not available in [facility]).  I advised that if she is not responding to high dose diuretics, she should present for EF eval and likely admission, as I suspect she will require IV diuresis.  She refuses to present to [facility] after prior bad experience but would consider having her husband drive her to [facility] for eval.  She wishes to take an additional 1mg  of bumex now to see if this helps.  If not, she will present to [facility] ED later tonight or tomorrow.  Murray Hodgkins, NP 12/03/2020, 1:53 PM

## 2020-12-05 DIAGNOSIS — I5033 Acute on chronic diastolic (congestive) heart failure: Secondary | ICD-10-CM | POA: Diagnosis not present

## 2020-12-05 DIAGNOSIS — I5022 Chronic systolic (congestive) heart failure: Secondary | ICD-10-CM | POA: Diagnosis not present

## 2020-12-05 DIAGNOSIS — N179 Acute kidney failure, unspecified: Secondary | ICD-10-CM | POA: Diagnosis not present

## 2020-12-05 DIAGNOSIS — E1122 Type 2 diabetes mellitus with diabetic chronic kidney disease: Secondary | ICD-10-CM | POA: Diagnosis not present

## 2020-12-05 DIAGNOSIS — N1831 Chronic kidney disease, stage 3a: Secondary | ICD-10-CM | POA: Diagnosis not present

## 2020-12-05 DIAGNOSIS — I130 Hypertensive heart and chronic kidney disease with heart failure and stage 1 through stage 4 chronic kidney disease, or unspecified chronic kidney disease: Secondary | ICD-10-CM | POA: Diagnosis not present

## 2020-12-06 ENCOUNTER — Telehealth: Payer: Self-pay | Admitting: Cardiology

## 2020-12-06 ENCOUNTER — Other Ambulatory Visit: Payer: Self-pay | Admitting: *Deleted

## 2020-12-06 NOTE — Patient Outreach (Signed)
[facility]) Care Management  [place]  12/06/2020   Denise Freeman 08/20/1950 [id number]  Subjective: Telephone Assessment  Complex Care management   Patient is a 70 year old female with PMHX: Combined Systolic and Diastolic Heart Failure, Chronic hypoxic respiratory failure, chronic oxygen therapy at 3 liters OSA with Bipap, Chronic pain syndrome, hypertension, Chronic Atrial Fib, Diabetes ( A1c 8.5% ib 08/10/20 )  Depression with anxiety, partially blind both eye  hard of hearing  Recent Admission at [facility] 6/13-6/19/22 Subjective: Patient states that she is trying to feel better. She reports contacting [name], NP office regarding continued swelling in lower legs and weight up to 219 for past 2 days she confirms scales accuracy.  She reports receiving new orders to continue to take metolazone 5 mg daily , stop bumex resume Lasix at 40 mg 1.5 tabs daily on tomorrow. Patient voiced concern regarding continued swelling in legs and abdominal area. She discussed call to cardiology 7/3 due to swelling and weight concerns she reports taking bumex extra as recommended but did not see much results.  Patient continues to seek advice from PCP as well as cardiology regarding fluid management .  She reports limiting salt and fluids in diet.  Patient reports working on home physical therapy exercises as recommended, home health Rn visit on yesterday to change compression dressing to legs.     Objective:   Encounter Medications:  Outpatient Encounter Medications as of 12/06/2020  Medication Sig Note   furosemide (LASIX) 40 MG tablet Take 40 mg by mouth daily. Take 1 and 1/2 tablets daily    magnesium oxide (MAG-OX) 400 MG tablet Take 400 mg by mouth daily.    albuterol (PROVENTIL) (2.5 MG/3ML) 0.083% nebulizer solution Take 3 mLs (2.5 mg total) by nebulization every 6 (six) hours as needed for wheezing or shortness of breath.    allopurinol (ZYLOPRIM) 100  MG tablet TAKE 1 TABLET BY MOUTH ONCE DAILY. (Patient taking differently: Take 100 mg by mouth daily.)    apixaban (ELIQUIS) 5 MG TABS tablet Take 1 tablet (5 mg total) by mouth 2 (two) times daily.    atorvastatin (LIPITOR) 20 MG tablet Take 1 tablet (20 mg total) by mouth daily.    budesonide (PULMICORT) 0.5 MG/2ML nebulizer solution Take 2 mLs (0.5 mg total) by nebulization 2 (two) times daily.    bumetanide (BUMEX) 1 MG tablet Take 3 tablets (3 mg total) by mouth 2 (two) times daily. (Patient not taking: Reported on 12/06/2020) 12/06/2020: Reports Bumex discontinued by PCP , to start  restart Lasix as prescribed by PCP    cetirizine (ZYRTEC) 10 MG tablet Take 10 mg by mouth daily.    cholecalciferol (VITAMIN D) 400 units TABS tablet Take 1 tablet (400 Units total) by mouth 2 (two) times daily.    clobetasol cream (TEMOVATE) 3.78 % Apply 1 application topically daily.     colchicine 0.6 MG tablet Take 0.6 mg by mouth daily as needed. gout    escitalopram (LEXAPRO) 10 MG tablet Take 1 tablet by mouth daily.    FEROSUL 325 (65 Fe) MG tablet Take 325 mg by mouth daily.     fluticasone (FLONASE) 50 MCG/ACT nasal spray Place 1 spray into both nostrils 2 (two) times daily.    Insulin Glargine (BASAGLAR KWIKPEN) 100 UNIT/ML Inject 20 Units into the skin 2 (two) times daily. (Patient taking differently: Inject 30 Units into the skin 2 (two) times daily.)    insulin lispro (  HUMALOG) 100 UNIT/ML KiwkPen Inject 0.02 mLs (2 Units total) into the skin 3 (three) times daily before meals.    ipratropium (ATROVENT) 0.02 % nebulizer solution Take 2.5 mLs (0.5 mg total) by nebulization every 6 (six) hours as needed for wheezing or shortness of breath.    metolazone (ZAROXOLYN) 2.5 MG tablet Take 1.25 mg by mouth 3 (three) times a week. 11/28/2020: New order per PCP , 5 mg daily    metoprolol succinate (TOPROL XL) 25 MG 24 hr tablet Take 1.5 tablets (37.5 mg total) by mouth daily.    mirtazapine (REMERON) 15 MG tablet  Take 1 tablet (15 mg total) by mouth at bedtime.    modafinil (PROVIGIL) 200 MG tablet Take 1 tablet (200 mg total) by mouth daily.    montelukast (SINGULAIR) 10 MG tablet Take 1 tablet (10 mg total) by mouth at bedtime.    oxyCODONE-acetaminophen (PERCOCET/ROXICET) 5-325 MG tablet Take 1 tablet by mouth 2 (two) times daily as needed for severe pain.    pantoprazole (PROTONIX) 20 MG tablet TAKE (1) TABLET BY MOUTH TWICE A DAY BEFORE MEALS. (BREAKFAST AND SUPPER) (Patient taking differently: Take 20 mg by mouth 2 (two) times daily before a meal.)    potassium chloride SA (KLOR-CON) 20 MEQ tablet Take 20 mEq by mouth 2 (two) times daily.    primidone (MYSOLINE) 50 MG tablet TAKE ONE TABLET BY MOUTH AT BEDTIME. (Patient taking differently: Take 50 mg by mouth at bedtime.)    Probiotic Product (PROBIOTIC PO) Take 1 tablet by mouth at bedtime.    revefenacin (YUPELRI) 175 MCG/3ML nebulizer solution Take 175 mcg by nebulization daily.    TRUE METRIX BLOOD GLUCOSE TEST test strip USE TO TEST BLOOD SUGAR AS DIRECTED.    Vitamin A 2400 MCG (8000 UT) CAPS Take 8,000 Units by mouth every morning.     Vitamin D, Ergocalciferol, (DRISDOL) 1.25 MG (50000 UNIT) CAPS capsule Take 1 capsule (50,000 Units total) by mouth every 7 (seven) days.    [DISCONTINUED] potassium chloride 20 MEQ TBCR Take 20 mEq by mouth 2 (two) times daily.    No facility-administered encounter medications on file as of 12/06/2020.    Functional Status:  In your present state of health, do you have any difficulty performing the following activities: 11/14/2020 03/23/2020  Hearing? N Y  Vision? Y Y  Difficulty concentrating or making decisions? N N  Walking or climbing stairs? Y Y  Comment - -  Dressing or bathing? Y Y  Comment - -  Doing errands, shopping? [place] and eating ? - -  Comment - -  Using the Toilet? - -  In the past six months, have you accidently leaked urine? - -  Do you have problems with  loss of bowel control? - -  Managing your Medications? - -  Managing your Finances? - -  Comment - -  Housekeeping or managing your Housekeeping? - -  Comment - -  Some recent data might be hidden    Fall/Depression Screening: Fall Risk  02/01/2020 06/11/2019 05/17/2019  Falls in the past year? 1 1 1   Number falls in past yr: 0 1 1  Injury with Fall? 0 1 1  Comment - - -  Risk Factor Category  - - -  Comment - - -  Risk for fall due to : History of fall(s);Impaired balance/gait;Impaired vision History of fall(s) History of fall(s);Impaired vision;Impaired mobility  Risk for fall due to:  Comment - - -  Follow up Falls prevention discussed Falls evaluation completed;Education provided Education provided;Falls prevention discussed   PHQ 2/9 Scores 09/19/2020 05/17/2019 11/28/2017 11/27/2017 10/03/2017 09/15/2017 09/05/2017  PHQ - 2 Score 1 0 0 5 6 0 0  PHQ- 9 Score - - - - 23 - -    Assessment:  Patient will benefit from ongoing care management for support education and care coordination of chronic conditions.   Care Plan Care Plan : Heart Failure (Adult)  Updates made by Alfonzo Feller, RN since 12/06/2020 12:00 AM     Problem: Disease Progression (Heart Failure)   Priority: High  Onset Date: 03/14/2020     Goal: Health Optimized as evidenced by patient report of no hospital readmission over the next 30 days   Start Date: 11/20/2020  Expected End Date: 12/30/2020  Recent Progress: On track  Priority: High  Note:   Evidence-based guidance:  Assess need for potential diet and fluid modification, such as reduced sodium or fluid intake.  Minimize unnecessary dietary restrictions to increase oral intake. Note: Sodium restriction should be individualized to the patient and clinical status.  Facilitate home monitoring of weight.   Notes:7/2 noted weight increase and follow through with contacting MD for treatment plan  Reviewed recent weight today 208 weight down . Discussed continued  fluid in legs with weeping compression dressing to legs.     Problem: Activity Tolerance (Heart Failure)   Priority: High  Onset Date: 03/14/2020     Long-Range Goal: Activity Tolerance Optimized as evidenced by patient report of ability to complete usual daily activity in home over the next 60 days   Start Date: 11/20/2020  Expected End Date: 01/31/2021  Recent Progress: On track  Priority: High  Note:   Evidence-based guidance:   Encourage optimal, safe functional mobility and self-care performance based on ability and tolerance.   Promote breathing and energy conservation techniques, such as pursed-lip breathing, preplanning and pacing of activity, balancing activity and rest.  Notes:  Reinforced participating in home health therapy, fall safety measures reviewed keeping frequent used items including phone when husband not present.     Task: Maintain Strength and Functional Ability   Due Date: 01/31/2021  Priority: Routine  Note:   Care Management Activities:    - activity or exercise based on tolerance encouraged - energy conservation techniques promoted - self-care encouraged   Notes 7/6 encouraged keeping legs elevated to help with reducing swelling in her legs, reviewed benefits of exercise as tolerated and recommended by hhpt.     Care Plan : Diabetes Type 2 (Adult)  Updates made by Alfonzo Feller, RN since 12/06/2020 12:00 AM     Problem: Disease Progression (Diabetes, Type 2)   Priority: Medium  Onset Date: 03/14/2020     Long-Range Goal: Disease Progression Prevented or Minimized as evidenced by patient report of maintaining A1c at below 8  over the next 90 days   Start Date: 11/28/2020  Expected End Date: 03/02/2021  Recent Progress: On track  Priority: Medium  Note:   Evidence-based guidance:  Prepare patient for laboratory and diagnostic exams based on risk and present  Address pregnancy planning and contraceptive choice, especially when prescribing  antihypertensive or statin.  Ensure completion of annual comprehensive foot exam and dilated eye exam.   Implement additional individualized goals and interventions based on identified risk factors.  Goal adjusted  Notes: reinforced attending appointment for eye exam scheduled. Emphasized continued monitoring of blood sugars and keeping  a record.     Care Plan : Wellness (Adult)  Updates made by Alfonzo Feller, RN since 12/06/2020 12:00 AM     Problem: Medication Adherence (Wellness)   Priority: Medium  Onset Date: 03/14/2020     Long-Range Goal: Medication Adherence Maintained as evidenced by patient report of taking medication as prescribed over the next 60 days   Start Date: 03/14/2020  Expected End Date: 12/30/2020  Recent Progress: On track  Priority: Medium  Note:   Evidence-based guidance:  Develop a complete and accurate medication list including those prescribed and over-the-counter, those taken only occasionally and those not taken by mouth such as injections, inhalers, ointments or creams and drops.  Encourage the use of medication reminders such as clock or cell phone alarm, color coding, pillboxes for am/pm and days of the week, pharmacy refill reminder, auto-refill system or mail-order option. Using pill packaging option .  Marland Kitchen  Provide frequent follow-up providing motivation, encouragement and support when medication nonadherence is identified.   Notes: Patient able to review medication changes with discontinuing Bumex, resuming lasix , continuing metolazone . Call to provider to confirm changes      Task: Optimize Medication Use   Due Date: 12/30/2020  Priority: Routine  Note:   Care Management Activities:    - administration or use of medication demonstrated - understanding of current medications assessed   Barriers: Health Behaviors Knowledge  Notes:  Emphasized adherence to current medication plan, discussed consistency in provider contacting to manage  fluids and weight increases .       Goals Addressed             This Visit's Progress    Find Help in My Community   On track    Timeframe:  Short-Term Goal Priority:  Medium Start Date:  09/11/20                           Expected End Date:   01/30/21                   Follow Up Date 12/20/20   - follow-up on any referrals for help I am given    Why is this important?   Knowing how and where to find help for yourself or family in your neighborhood and community is an important skill.  You will want to take some steps to learn how.    Barriers: Knowledge  Notes:  7/6 Patient confirms home health rn visit on yesterday, for compression dressing changes to her legs.  6/28 Calls to PCP office regarding need for [facility] for compression dressing changes to lower legs, Call to Serious illness regarding following up with patient regarding next home visit. Celebrated patient receiving Lift chair after contact with [employer].       Making and keeping appointments   On track    Follow Up Date 12/07/20 Timeframe:  Long-Range Goal Priority:  High Start Date:    03/29/20                         Expected End Date: 12/30/20                    - call to cancel if needed - keep a calendar with appointment dates Reschedule missed appointments  reinforced    Why is this important?   Part of staying healthy is seeing the doctor for  follow-up care.  If you forget your appointments, there are some things you can do to stay on track.    Barriers: Health Behaviors Transportation   Notes:7/6 discussed upcoming PCP visit on 7/11 spouse will be able to assist with transportation.   6/28 Celebrated attending PCP post discharge visit. Reviewed upcoming appointments with Pulmonary and Cardiology, will follow up in next week if she needs transportation assistance arranged.       Manage My Medicine   On track    Follow Up Date 7/28022 Timeframe:  Long-Range Goal Priority:  High Start Date:      03/14/20                       Expected End Date:  12/30/20                    - call for medicine refill 2 or 3 days before it runs out  - take medications as prescribed per most recent after visit summary   Why is this important?   These steps will help you keep on track with your medicines.    Barriers: None  Notes:  7/6 Verifies taking medications as prescribed notifying pharmacy for refill sooner. She reports being able identify medications that are changed to return from pill packaging , bumex being discontinued and restarted on lasix 40 mg 1.5 tablets  Along with metolazone 5 mg daily.  6/28 During visit patient explained new medication changes to me of metolazone, magnesium level low ,provider to prescribe medication.       Monitor and Manage My Blood Sugar   On track    Follow Up Date 12/20/20 Timeframe:  Long-Range Goal Priority:  High Start Date:  03/14/20                           Expected End Date:  12/30/20                       - check blood sugar at prescribed times - check blood sugar if I feel it is too high or too low - take the blood sugar meter to all doctor visits  Replace libre sensor to monitor blood sugars as recommended and with assistance of office as needed    Why is this important?   Checking your blood sugar at home helps to keep it from getting very high or very low.  Writing the results in a diary or log helps the doctor know how to care for you.  Your blood sugar log should have the time, date and the results.  Also, write down the amount of insulin or other medicine that you take.  Other information, like what you ate, exercise done and how you were feeling, will also be helpful.  Barriers: Health Behaviors Knowledge     Notes: 12/06/20 verified patient has been approved for a new freestyle libre she has contacted PCP office for new prescription for sensors. Blood sugar range 140 to 200 ranges.  6/28 reviewed recent cbg reading hs 200, am 145 no  hypoglycemia episodes       Obtain Eye Exam   On track    Follow Up Date 12/30/20 Timeframe:  Long-Range Goal Priority:  Medium Start Date:   1012/21  Expected End Date:    01/20/21                - keep appointment with eye doctor  Rescheduled missed appointments  encouraged    Why is this important?   Eye check-ups are important when you have diabetes.  Vision loss can be prevented.   Barriers: Health Behaviors  Notes:  7/6 Celebrated patient rescheduling appointment       track and manage activity and exertion   On track     Timeframe:  Long-Range Goal Priority:  Medium Start Date:   03/14/20                          Expected End Date: 7/30/ 22                      Follow Up Date : 12/20/20  Barriers: Health Behaviors Knowledge    - pace activity allowing for rest     Why is this important?   Exercising is very important when managing your heart failure.  It will help your heart get stronger.    Notes:  7/6 Reinforced continued participation in home therapy exercises . Participating in home health therapy, visit for today. Fall prevention measures reviewed. Patient remaining in her recliner chair when spouse running errands       Track and Manage Fluids and Swelling   On track    Follow Up Date 12/20/20 Timeframe:  Long-Range Goal Priority:  Medium Start Date:     03/14/20                        Expected End Date:   12/30/20   Barriers:               Health Behaviors Knowledge - call office if I gain more than 2 pounds in one day or 5 pounds in one week - do ankle pumps when sitting - keep legs up while sitting - track weight in diary - watch for swelling in feet, ankles and legs every day - weigh myself daily  Reinforced action plan for yellow zone symptoms .  Why is this important?   It is important to check your weight daily and watch how much salt and liquids you have.  It will help you to manage your heart failure.  Notes:  12/06/20 She identifies worsening symptoms and is making contact with MD, reinforced keeping legs elevated and limiting salt in diet . Reviewed weights for last 3 days , 218, 219 and 219  11/28/20 continues daily weight monitoring , weight today 208 celebrated keeping a log       Track and Manage Symptoms   On track    Follow Up Date 12/20/20 Timeframe:  Long-Range Goal Priority:  Medium Start Date:   03/14/20                          Expected End Date: 12/30/20                      - bring diary to all appointments - follow rescue plan if symptoms flare-up - know when to call the doctor - dress right for the weather, hot or cold  Reinforced daily heart failure management skills limiting salt, weighing daily, taking medications as prescribed   Why is this important?   You  will be able to handle your symptoms better if you keep track of them.  Making some simple changes to your lifestyle will help.  Eating healthy is one thing you can do to take good care of yourself. Barriers: Health Behaviors Knowledge  Notes: Continues to notify PCP and Cardiology regarding symptoms of weight gain and swelling. Discussed consistency in following up with cardiology regarding managing heart failure          Plan:  Follow-up: Patient agrees to Care Plan and Follow-up. Will plan follow up in the next 2 weeks , patient encouraged to notify CM sooner if new concerns.  Placed call to cardiology to inform regarding patient weight still being up and recent changes per PCP for fluid management .    Joylene Draft, RN, BSN  [place] Management Coordinator  [phone]- Mobile [phone]- Toll Free Main Office

## 2020-12-06 NOTE — Telephone Encounter (Signed)
Received telephone call from [facility] with [facility] . States that Denise Freeman called her PCP today due to the increased fluid weight which is 219. Her PCP [name], NP put patient on Metolazone 5 mg daily . Was told to stop Bumex and resume Lasix 40 mg 1 1/2 tabs daily. [name] @ [phone].

## 2020-12-06 NOTE — Telephone Encounter (Signed)
Spoke to [employer] with [facility], who called [name], NP office to get a verbal affirmation of medication changes. Ginger (nurse w/ [facility]) confirmed orders. [name] then called pharmacy and relayed orders. Will forward to Dr. Harl Bowie as a FYI.

## 2020-12-07 ENCOUNTER — Ambulatory Visit: Payer: Medicare Other | Admitting: Family Medicine

## 2020-12-07 DIAGNOSIS — I130 Hypertensive heart and chronic kidney disease with heart failure and stage 1 through stage 4 chronic kidney disease, or unspecified chronic kidney disease: Secondary | ICD-10-CM | POA: Diagnosis not present

## 2020-12-07 DIAGNOSIS — E1122 Type 2 diabetes mellitus with diabetic chronic kidney disease: Secondary | ICD-10-CM | POA: Diagnosis not present

## 2020-12-07 DIAGNOSIS — I5033 Acute on chronic diastolic (congestive) heart failure: Secondary | ICD-10-CM | POA: Diagnosis not present

## 2020-12-07 DIAGNOSIS — I5022 Chronic systolic (congestive) heart failure: Secondary | ICD-10-CM | POA: Diagnosis not present

## 2020-12-07 DIAGNOSIS — N179 Acute kidney failure, unspecified: Secondary | ICD-10-CM | POA: Diagnosis not present

## 2020-12-07 DIAGNOSIS — N1831 Chronic kidney disease, stage 3a: Secondary | ICD-10-CM | POA: Diagnosis not present

## 2020-12-08 DIAGNOSIS — I5022 Chronic systolic (congestive) heart failure: Secondary | ICD-10-CM | POA: Diagnosis not present

## 2020-12-08 DIAGNOSIS — N179 Acute kidney failure, unspecified: Secondary | ICD-10-CM | POA: Diagnosis not present

## 2020-12-08 DIAGNOSIS — N1831 Chronic kidney disease, stage 3a: Secondary | ICD-10-CM | POA: Diagnosis not present

## 2020-12-08 DIAGNOSIS — I5033 Acute on chronic diastolic (congestive) heart failure: Secondary | ICD-10-CM | POA: Diagnosis not present

## 2020-12-08 DIAGNOSIS — E1122 Type 2 diabetes mellitus with diabetic chronic kidney disease: Secondary | ICD-10-CM | POA: Diagnosis not present

## 2020-12-08 DIAGNOSIS — I130 Hypertensive heart and chronic kidney disease with heart failure and stage 1 through stage 4 chronic kidney disease, or unspecified chronic kidney disease: Secondary | ICD-10-CM | POA: Diagnosis not present

## 2020-12-10 DIAGNOSIS — R531 Weakness: Secondary | ICD-10-CM | POA: Diagnosis not present

## 2020-12-10 DIAGNOSIS — W19XXXA Unspecified fall, initial encounter: Secondary | ICD-10-CM | POA: Diagnosis not present

## 2020-12-10 DIAGNOSIS — R5381 Other malaise: Secondary | ICD-10-CM | POA: Diagnosis not present

## 2020-12-11 ENCOUNTER — Emergency Department (HOSPITAL_COMMUNITY)
Admission: EM | Admit: 2020-12-11 | Discharge: 2021-01-01 | Disposition: E | Payer: Medicare Other | Attending: Emergency Medicine | Admitting: Emergency Medicine

## 2020-12-11 DIAGNOSIS — Z87891 Personal history of nicotine dependence: Secondary | ICD-10-CM | POA: Diagnosis not present

## 2020-12-11 DIAGNOSIS — R4020 Unspecified coma: Secondary | ICD-10-CM | POA: Diagnosis not present

## 2020-12-11 DIAGNOSIS — I469 Cardiac arrest, cause unspecified: Secondary | ICD-10-CM | POA: Insufficient documentation

## 2020-12-11 DIAGNOSIS — Z86018 Personal history of other benign neoplasm: Secondary | ICD-10-CM | POA: Diagnosis not present

## 2020-12-11 DIAGNOSIS — R0689 Other abnormalities of breathing: Secondary | ICD-10-CM | POA: Diagnosis not present

## 2020-12-11 DIAGNOSIS — J449 Chronic obstructive pulmonary disease, unspecified: Secondary | ICD-10-CM | POA: Insufficient documentation

## 2020-12-11 DIAGNOSIS — I5033 Acute on chronic diastolic (congestive) heart failure: Secondary | ICD-10-CM | POA: Insufficient documentation

## 2020-12-11 DIAGNOSIS — I499 Cardiac arrhythmia, unspecified: Secondary | ICD-10-CM | POA: Diagnosis not present

## 2020-12-11 DIAGNOSIS — R0602 Shortness of breath: Secondary | ICD-10-CM | POA: Diagnosis not present

## 2020-12-11 DIAGNOSIS — I130 Hypertensive heart and chronic kidney disease with heart failure and stage 1 through stage 4 chronic kidney disease, or unspecified chronic kidney disease: Secondary | ICD-10-CM | POA: Diagnosis not present

## 2020-12-11 DIAGNOSIS — Z7901 Long term (current) use of anticoagulants: Secondary | ICD-10-CM | POA: Diagnosis not present

## 2020-12-11 DIAGNOSIS — Z7951 Long term (current) use of inhaled steroids: Secondary | ICD-10-CM | POA: Insufficient documentation

## 2020-12-11 DIAGNOSIS — Z794 Long term (current) use of insulin: Secondary | ICD-10-CM | POA: Insufficient documentation

## 2020-12-11 DIAGNOSIS — E1122 Type 2 diabetes mellitus with diabetic chronic kidney disease: Secondary | ICD-10-CM | POA: Diagnosis not present

## 2020-12-11 DIAGNOSIS — Z79899 Other long term (current) drug therapy: Secondary | ICD-10-CM | POA: Insufficient documentation

## 2020-12-11 DIAGNOSIS — R404 Transient alteration of awareness: Secondary | ICD-10-CM | POA: Diagnosis not present

## 2020-12-11 DIAGNOSIS — E1142 Type 2 diabetes mellitus with diabetic polyneuropathy: Secondary | ICD-10-CM | POA: Diagnosis not present

## 2020-12-11 DIAGNOSIS — E1140 Type 2 diabetes mellitus with diabetic neuropathy, unspecified: Secondary | ICD-10-CM | POA: Insufficient documentation

## 2020-12-11 DIAGNOSIS — N183 Chronic kidney disease, stage 3 unspecified: Secondary | ICD-10-CM | POA: Diagnosis not present

## 2020-12-11 DIAGNOSIS — J45909 Unspecified asthma, uncomplicated: Secondary | ICD-10-CM | POA: Insufficient documentation

## 2020-12-11 LAB — I-STAT CHEM 8, ED
BUN: 107 mg/dL — ABNORMAL HIGH (ref 8–23)
Calcium, Ion: 1.05 mmol/L — ABNORMAL LOW (ref 1.15–1.40)
Chloride: 110 mmol/L (ref 98–111)
Creatinine, Ser: 3.3 mg/dL — ABNORMAL HIGH (ref 0.44–1.00)
Glucose, Bld: 111 mg/dL — ABNORMAL HIGH (ref 70–99)
HCT: 42 % (ref 36.0–46.0)
Hemoglobin: 14.3 g/dL (ref 12.0–15.0)
Potassium: 8 mmol/L (ref 3.5–5.1)
Sodium: 135 mmol/L (ref 135–145)
TCO2: 22 mmol/L (ref 22–32)

## 2020-12-11 LAB — CBG MONITORING, ED: Glucose-Capillary: 118 mg/dL — ABNORMAL HIGH (ref 70–99)

## 2020-12-11 MED ORDER — EPINEPHRINE 1 MG/10ML IJ SOSY
PREFILLED_SYRINGE | INTRAMUSCULAR | Status: AC | PRN
  Administered 2020-12-11: 1 mg via INTRAVENOUS

## 2020-12-15 ENCOUNTER — Other Ambulatory Visit: Payer: Self-pay | Admitting: *Deleted

## 2020-12-15 NOTE — Patient Outreach (Signed)
[facility]) Care Management  12/15/2020  Denise Freeman Belt 1950-12-07 [id number]   Case Closure    Noted per Epic record patient deceased 12/26/20  Plan Will close case to [facility] care management .    Plan Will send PCP case closure letter.    Joylene Draft, RN, BSN  [place] Management Coordinator  [phone]- Mobile [phone]- Toll Free Main Office

## 2020-12-20 ENCOUNTER — Ambulatory Visit: Payer: Medicare Other | Admitting: *Deleted

## 2021-01-01 NOTE — ED Provider Notes (Addendum)
[facility] EMERGENCY DEPARTMENT Provider Note   CSN: [id number] Arrival date & time: 12/22/20  1246     History Chief Complaint  Patient presents with   Cardiac Arrest    Denise Freeman is a 70 y.o. female.  HPI  70 year old chronically ill female presents the emergency department active CPR.  Report from EMS that the patient became unresponsive while driving in the car with her husband.  He pulled over at a gas station and did initiate bystander CPR.  When EMS arrived they continued CPR, placed a [facility] airway.  They state that she was mainly in PEA, initially she had 2 runs of V. fib that were shocked.  She received epinephrine and amiodarone prior to arrival.  No renal history.  Active CPR on arrival.  Past Medical History:  Diagnosis Date   Allergy    Anemia    Anxiety    Asthma    Atrial fibrillation ([place])    Bipolar 1 disorder ([place])    Blind    "partially in both eyes" (03/14/2016)   Cholelithiasis    a. 09/2016 s/p Lap Chole.   Chronic bronchitis (HCC)    Chronic combined systolic and diastolic congestive heart failure ([place])    a. 09/2016 Echo: EF 30-35%.   Colon polyps    COPD (chronic obstructive pulmonary disease) (HCC)    Depression    Family history of adverse reaction to anesthesia    Uncle was positive for malignant hyperthermia; patient had testing done and was negative.   Fibromyalgia    GERD (gastroesophageal reflux disease)    Gout    High cholesterol    History of blood transfusion 06/2015   "bleeding from my rectum"   History of hiatal hernia    HOH (hard of hearing)    Hx of colonic polyps 03/21/2016   3 small adenomas no recall - co-morbidities   Neuropathy    Disc Back    NICM (nonischemic cardiomyopathy) ([place])    a. Previously worked up in [place], [place]-->low EF with subsequent recovery.  Multiple caths (last ~ 2014 per pt report)--reportedly nl cors;  b. 09/2016 Echo: EF 30-35%, antsept/apical HK, mild MR, mildly dil LA, mod  dil RA;  c. 09/2016 Lexi MV: EF 26%, glob HK, sept DK, med size, mod intensity fixed septal defect - BBB/PVC related artifact, no ischemia.   On home oxygen therapy    "3L; 24/7" (03/14/2016)   OSA treated with BiPAP    uses biPAP, 10 (03/14/2016)   Osteoarthritis    Oxygen deficiency    Pneumonia    Type II diabetes mellitus [facility])     Patient Active Problem List   Diagnosis Date Noted   Acute on chronic diastolic (congestive) heart failure ([place]) 11/14/2020   Acute decompensated heart failure ([place]) 03/23/2020   Hyperosmolar hyperglycemic state (HHS) ([place])    Hypokalemia    Diabetic hyperosmolar non-ketotic state ([place])    Class 1 obesity due to excess calories with serious comorbidity and body mass index (BMI) of 34.0 to 34.9 in adult    Hyperglycemia 03/20/2020   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/2020   Vitreomacular adhesion of right eye 03/15/2020   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   Vision loss, central 03/15/2020   Pseudophakia, both eyes 03/15/2020   Gastroesophageal reflux disease    Prolonged QT interval 04/27/2019   CHF exacerbation ([place]) 01/24/2019   Acute exacerbation of CHF (congestive heart failure) ([place]) 01/23/2019  Gout 11/16/2017   Chronic kidney disease 11/16/2017   Uncontrolled type 2 diabetes mellitus with hyperglycemia ([place]) 11/12/2017   Pain of left lower extremity 10/24/2017   Medically complex patient 10/03/2017   Chronic pain syndrome 10/03/2017   Unstable gait 10/03/2017   Diarrhea 07/29/2017   Type 2 diabetes mellitus with stage 3 chronic kidney disease, with long-term current use of insulin ([place]) 04/01/2017   Essential hypertension, benign 04/01/2017   Mood disorder in conditions classified elsewhere 03/31/2017   Chronic diastolic congestive heart failure ([place]) 74/05/8785   Chronic systolic CHF (congestive heart failure) ([place]) 11/05/2016   Pressure injury of skin 09/09/2016   NICM (nonischemic  cardiomyopathy) ([place])    Obesity, Class III, BMI 40-49.9 (morbid obesity) ([place])    Acute systolic congestive heart failure ([place])    S/P laparoscopic cholecystectomy    Heart failure ([place]) 09/06/2016   Dyslipidemia 08/07/2016   Vision loss 04/19/2016   Hx of colonic polyps 03/21/2016   Blood in stool    Benign neoplasm of cecum    Benign neoplasm of rectum    CKD (chronic kidney disease), stage III ([place]) 03/14/2016   Mixed hyperlipidemia 03/14/2016   Scalp lesion 03/14/2016   Peripheral polyneuropathy 01/26/2016   Vitamin D deficiency 09/26/2015   DM type 2 causing vascular disease ([place]) 08/28/2015   Chronic upper GI bleeding 06/24/2015   Acute renal failure ([place]) 06/24/2015   COPD with asthma ([place]) 02/22/2015   OSA (obstructive sleep apnea) 08/22/2014   Other emphysema ([place]) 05/29/2014   Chronic hypoxic, hypercapnic respiratory failure 04/21/2014   Microcytic anemia 04/21/2014   Fibromyalgia 04/14/2014   Uncontrolled secondary diabetes mellitus with stage 3 CKD (GFR 30-59) ([place]) 04/14/2014   Dyspnea on exertion 04/14/2014   Acute on chronic combined systolic and diastolic CHF (congestive heart failure) ([place]) 04/14/2014   Unspecified atrial fibrillation ([place]) 04/12/2014    Past Surgical History:  Procedure Laterality Date   APPENDECTOMY     "they busted"   bladder stimulator     pt states, "it cannot be turned off; it's in my right hip; dead battery so it's not working anymore". (03/14/2016)   BLADDER SUSPENSION     2003, 2006 and 2010   CATARACT EXTRACTION W/PHACO Right 11/29/2014   Procedure: CATARACT EXTRACTION PHACO AND INTRAOCULAR LENS PLACEMENT (IOC);  Surgeon: Rutherford Guys, MD;  Location: [facility];  Service: Ophthalmology;  Laterality: Right;  CDE:3.81   CATARACT EXTRACTION W/PHACO Left 12/13/2014   Procedure: CATARACT EXTRACTION PHACO AND INTRAOCULAR LENS PLACEMENT (IOC);  Surgeon: Rutherford Guys, MD;  Location: [facility];  Service: Ophthalmology;  Laterality: Left;  CDE:6.59    CERVICAL DISC SURGERY N/A 2009   4, 6, and 7 cervical disc replaced   CHOLECYSTECTOMY N/A 09/13/2016   Procedure: LAPAROSCOPIC CHOLECYSTECTOMY;  Surgeon: Rolm Bookbinder, MD;  Location: [facility];  Service: General;  Laterality: N/A;   COLONOSCOPY WITH PROPOFOL N/A 03/15/2016   Procedure: COLONOSCOPY WITH PROPOFOL;  Surgeon: Gatha Mayer, MD;  Location: [facility];  Service: Endoscopy;  Laterality: N/A;   ESOPHAGOGASTRODUODENOSCOPY (EGD) WITH PROPOFOL N/A 03/15/2016   Procedure: ESOPHAGOGASTRODUODENOSCOPY (EGD) WITH PROPOFOL;  Surgeon: Gatha Mayer, MD;  Location: [facility];  Service: Endoscopy;  Laterality: N/A;   HEEL SPUR SURGERY Bilateral    HERNIA REPAIR     I & D EXTREMITY Right 06/13/2015   Procedure: MINOR IRRIGATION AND DEBRIDEMENT EXTREMITY REMOVAL OF NAIL;  Surgeon: Daryll Brod, MD;  Location: [facility];  Service: Orthopedics;  Laterality: Right;  TUBAL LIGATION     UMBILICAL HERNIA REPAIR     w/mesh     OB History   No obstetric history on file.     Family History  Problem Relation Age of Onset   Heart disease Mother    COPD Mother    Diabetes Mother    Breast cancer Mother    Heart disease Father    Hyperlipidemia Father    COPD Sister    Heart disease Sister    Diabetes Sister    Heart disease Maternal Grandmother    Cancer Maternal Grandmother        stomach   Cancer Maternal Grandfather        lung    Bipolar disorder Brother     Social History   Tobacco Use   Smoking status: Former    Packs/day: 2.00    Years: 14.00    Pack years: 28.00    Types: Cigarettes    Quit date: 04/12/1989    Years since quitting: 31.6   Smokeless tobacco: Never  Vaping Use   Vaping Use: Never used  Substance Use Topics   Alcohol use: No    Alcohol/week: 0.0 standard drinks   Drug use: No    Home Medications Prior to Admission medications   Medication Sig Start Date End Date Taking? Authorizing Provider  albuterol (PROVENTIL) (2.5 MG/3ML)  0.083% nebulizer solution Take 3 mLs (2.5 mg total) by nebulization every 6 (six) hours as needed for wheezing or shortness of breath. 02/17/20   Chesley Mires, MD  allopurinol (ZYLOPRIM) 100 MG tablet TAKE 1 TABLET BY MOUTH ONCE DAILY. Patient taking differently: Take 100 mg by mouth daily. 12/08/17   Steve Rattler, DO  apixaban (ELIQUIS) 5 MG TABS tablet Take 1 tablet (5 mg total) by mouth 2 (two) times daily. 10/04/20   Arnoldo Lenis, MD  atorvastatin (LIPITOR) 20 MG tablet Take 1 tablet (20 mg total) by mouth daily. 08/21/20 11/19/20  Verta Ellen., NP  budesonide (PULMICORT) 0.5 MG/2ML nebulizer solution Take 2 mLs (0.5 mg total) by nebulization 2 (two) times daily. 02/17/20   Chesley Mires, MD  bumetanide (BUMEX) 1 MG tablet Take 3 tablets (3 mg total) by mouth 2 (two) times daily. Patient not taking: Reported on 12/06/2020 11/21/20   Arnoldo Lenis, MD  cetirizine (ZYRTEC) 10 MG tablet Take 10 mg by mouth daily. 04/06/20   [provider]  cholecalciferol (VITAMIN D) 400 units TABS tablet Take 1 tablet (400 Units total) by mouth 2 (two) times daily. 07/17/17   Rogue Bussing, MD  clobetasol cream (TEMOVATE) 2.72 % Apply 1 application topically daily.  01/28/19   [provider]  colchicine 0.6 MG tablet Take 0.6 mg by mouth daily as needed. gout    [provider]  escitalopram (LEXAPRO) 10 MG tablet Take 1 tablet by mouth daily. 10/27/20   [provider]  FEROSUL 325 (65 Fe) MG tablet Take 325 mg by mouth daily.  01/08/19   [provider]  fluticasone (FLONASE) 50 MCG/ACT nasal spray Place 1 spray into both nostrils 2 (two) times daily.    [provider]  furosemide (LASIX) 40 MG tablet Take 40 mg by mouth daily. Take 1 and 1/2 tablets daily    [provider]  Insulin Glargine (BASAGLAR KWIKPEN) 100 UNIT/ML Inject 20 Units into the skin 2 (two) times daily. Patient taking differently: Inject 30 Units into the skin 2  (two) times daily. 03/27/20  Nita Sells, MD  insulin lispro (HUMALOG) 100 UNIT/ML KiwkPen Inject 0.02 mLs (2 Units total) into the skin 3 (three) times daily before meals. 03/27/20   Nita Sells, MD  ipratropium (ATROVENT) 0.02 % nebulizer solution Take 2.5 mLs (0.5 mg total) by nebulization every 6 (six) hours as needed for wheezing or shortness of breath. 02/17/20   Chesley Mires, MD  magnesium oxide (MAG-OX) 400 MG tablet Take 400 mg by mouth daily.    [provider]  metolazone (ZAROXOLYN) 2.5 MG tablet Take 1.25 mg by mouth 3 (three) times a week. 09/28/20   [provider]  metoprolol succinate (TOPROL XL) 25 MG 24 hr tablet Take 1.5 tablets (37.5 mg total) by mouth daily. 06/07/20   Arnoldo Lenis, MD  mirtazapine (REMERON) 15 MG tablet Take 1 tablet (15 mg total) by mouth at bedtime. 10/24/17   Norman Clay, MD  modafinil (PROVIGIL) 200 MG tablet Take 1 tablet (200 mg total) by mouth daily. 08/28/20   Chesley Mires, MD  montelukast (SINGULAIR) 10 MG tablet Take 1 tablet (10 mg total) by mouth at bedtime. 02/17/20   Chesley Mires, MD  oxyCODONE-acetaminophen (PERCOCET/ROXICET) 5-325 MG tablet Take 1 tablet by mouth 2 (two) times daily as needed for severe pain. 11/27/17   Rogue Bussing, MD  pantoprazole (PROTONIX) 20 MG tablet TAKE (1) TABLET BY MOUTH TWICE A DAY BEFORE MEALS. (BREAKFAST AND SUPPER) Patient taking differently: Take 20 mg by mouth 2 (two) times daily before a meal. 12/02/17   Riccio, Gardiner Rhyme, DO  potassium chloride SA (KLOR-CON) 20 MEQ tablet Take 20 mEq by mouth 2 (two) times daily. 10/27/20   [provider]  primidone (MYSOLINE) 50 MG tablet TAKE ONE TABLET BY MOUTH AT BEDTIME. Patient taking differently: Take 50 mg by mouth at bedtime. 11/10/17   Rogue Bussing, MD  Probiotic Product (PROBIOTIC PO) Take 1 tablet by mouth at bedtime.    [provider]  revefenacin (YUPELRI) 175 MCG/3ML nebulizer solution  Take 175 mcg by nebulization daily.    [provider]  TRUE METRIX BLOOD GLUCOSE TEST test strip USE TO TEST BLOOD SUGAR AS DIRECTED. 09/24/17   Cassandria Anger, MD  Vitamin A 2400 MCG (8000 UT) CAPS Take 8,000 Units by mouth every morning.     [provider]  Vitamin D, Ergocalciferol, (DRISDOL) 1.25 MG (50000 UNIT) CAPS capsule Take 1 capsule (50,000 Units total) by mouth every 7 (seven) days. 03/22/20   Barton Dubois, MD  potassium chloride 20 MEQ TBCR Take 20 mEq by mouth 2 (two) times daily. 05/02/19 07/02/19  Barton Dubois, MD    Allergies    Oseltamivir, Xarelto [rivaroxaban], Ancef [cefazolin], Levaquin [levofloxacin in d5w], Lyrica [pregabalin], Tamiflu [oseltamivir phosphate], Zoloft [sertraline hcl], Torsemide, Augmentin [amoxicillin-pot clavulanate], Ciprofloxacin, Haldol [haloperidol], Nsaids, Penicillins, and Topamax [topiramate]  Review of Systems   Review of Systems  Unable to perform ROS: Intubated   Physical Exam Updated Vital Signs Resp 20   SpO2 100%   Physical Exam Constitutional:      Comments: Unresponsive, King airway in place, active CPR  Eyes:     Comments: Pupils are 3 mm bilaterally and fixed  Pulmonary:     Comments: Bilateral breath sounds with a King airway, oxygen saturation 100% Skin:    Comments: Cool to the touch    ED Results / Procedures / Treatments   Labs (all labs ordered are listed, but only abnormal results are displayed) Labs Reviewed  CBG MONITORING, ED -  Abnormal; Notable for the following components:      Result Value   Glucose-Capillary 118 (*)    All other components within normal limits  I-STAT CHEM 8, ED - Abnormal; Notable for the following components:   Potassium 8.0 (*)    BUN 107 (*)    Creatinine, Ser 3.30 (*)    Glucose, Bld 111 (*)    Calcium, Ion 1.05 (*)    All other components within normal limits    EKG None  Radiology No results found.  Procedures .Critical Care  Date/Time:  Dec 20, 2020 2:51 PM Performed by: Lorelle Gibbs, DO Authorized by: Lorelle Gibbs, DO   Critical care provider statement:    Critical care time (minutes):  45   Critical care was necessary to treat or prevent imminent or life-threatening deterioration of the following conditions:  Cardiac failure   Critical care was time spent personally by me on the following activities:  Evaluation of patient's response to treatment, examination of patient, ordering and performing treatments and interventions, re-evaluation of patient's condition and obtaining history from patient or surrogate   I assumed direction of critical care for this patient from another provider in my specialty: no     Medications Ordered in ED Medications  EPINEPHrine (ADRENALIN) 1 MG/10ML injection (1 mg Intravenous Given 12/20/2020 1250)    ED Course  I have reviewed the triage vital signs and the nursing notes.  Pertinent labs & imaging results that were available during my care of the patient were reviewed by me and considered in my medical decision making (see chart for details).    MDM Rules/Calculators/A&P                          18-year-old female presents the emergency department active CPR.  King airway in place, equal bilateral breath sounds with 100% oxygenation.  Report is that she has been mainly PEA with 2 episodes of ventricular fibrillation that had been shocked.  Given amiodarone and epinephrine prior to arrival.  Patient remained in PEA while here in the department, active ACLS.  Repeat fingerstick was appropriate.  At all pulse checks patient's was PEA.  Continued to give epinephrine.  [name] downtime was approximately 45 minutes prior to arriving to [place].  The code was work to pull per ACLS protocol, at the final pulse check patient had no palpable pulses, remained in PEA.  Time of death 09-02-51, no objections in the room.  Husband notified, chaplain bedside with the husband.  ME declined the case.  Final  Clinical Impression(s) / ED Diagnoses Final diagnoses:  None    Rx / DC Orders ED Discharge Orders     None        Lorelle Gibbs, DO 20-Dec-2020 1450    Reilynn Lauro, Alvin Critchley, DO Dec 20, 2020 1452

## 2021-01-01 NOTE — ED Notes (Signed)
Patient placement notified of post-mortem checklist completion.

## 2021-01-01 NOTE — Progress Notes (Signed)
   12/19/2020 1330  Clinical Encounter Type  Visited With Family  Visit Type Death  Referral From Nurse    Nurse requested this chaplain to accompany the doctor to consultation room A. The doctor informed Mr. Windie Marasco of the patient's death. This chaplain provided active listening, grief support, and prayer. Mr. Herbie Baltimore declined the final visit with his wife. He stated he would have the patient cremated but did not know the funeral service he would use. His contact number is [phone], [address], [place], [place]. He asked me to call the patient's daughter at [phone], but she did not answer. This chaplain gave Mr. Koehn a Patient Placement card and escorted him out of the ED. This note was prepared by Jeanine Luz, M.Div..  For questions please contact by phone [phone].

## 2021-01-01 NOTE — Code Documentation (Signed)
Patient time of death occurred at 1253 per [place] MD.

## 2021-01-01 NOTE — ED Triage Notes (Signed)
Pt arrives via [facility] as CPR in progress. Pt was in car with husband, pt became unresponsive. Husband pulled over, pulled pt out of car and initiated CPR. EMS began Cpr at 1201 in PEA arrest, pt received total 6 epi, 300mg  amiodarone, 2 shocks delivered for v-fib. King airway in place, IO to L humerus.

## 2021-01-01 NOTE — ED Notes (Signed)
Pt taken down to [facility]

## 2021-01-01 NOTE — ED Notes (Signed)
[facility] called

## 2021-01-01 DEATH — deceased

## 2021-01-03 ENCOUNTER — Ambulatory Visit: Payer: Medicare Other | Admitting: Pulmonary Disease

## 2021-01-03 DIAGNOSIS — Z20822 Contact with and (suspected) exposure to covid-19: Secondary | ICD-10-CM | POA: Diagnosis not present

## 2021-01-11 ENCOUNTER — Ambulatory Visit: Payer: Medicare Other | Admitting: Cardiology

## 2021-01-22 ENCOUNTER — Encounter (INDEPENDENT_AMBULATORY_CARE_PROVIDER_SITE_OTHER): Payer: Medicare Other | Admitting: Ophthalmology
